# Patient Record
Sex: Male | Born: 1955 | Race: Black or African American | Hispanic: No | Marital: Married | Smoking: Former smoker
Health system: Southern US, Community
[De-identification: ages and names within clinical notes are randomized; demographics above are authoritative.]

## PROBLEM LIST (undated history)

## (undated) DIAGNOSIS — I779 Disorder of arteries and arterioles, unspecified: Secondary | ICD-10-CM

## (undated) DIAGNOSIS — I480 Paroxysmal atrial fibrillation: Secondary | ICD-10-CM

## (undated) DIAGNOSIS — M79671 Pain in right foot: Secondary | ICD-10-CM

## (undated) DIAGNOSIS — Z720 Tobacco use: Secondary | ICD-10-CM

## (undated) DIAGNOSIS — E119 Type 2 diabetes mellitus without complications: Secondary | ICD-10-CM

## (undated) DIAGNOSIS — I639 Cerebral infarction, unspecified: Secondary | ICD-10-CM

## (undated) DIAGNOSIS — I10 Essential (primary) hypertension: Secondary | ICD-10-CM

## (undated) DIAGNOSIS — G8194 Hemiplegia, unspecified affecting left nondominant side: Secondary | ICD-10-CM

## (undated) DIAGNOSIS — E7800 Pure hypercholesterolemia, unspecified: Secondary | ICD-10-CM

## (undated) DIAGNOSIS — R079 Chest pain, unspecified: Secondary | ICD-10-CM

## (undated) DIAGNOSIS — I5189 Other ill-defined heart diseases: Secondary | ICD-10-CM

## (undated) DIAGNOSIS — M79672 Pain in left foot: Secondary | ICD-10-CM

## (undated) DIAGNOSIS — E1165 Type 2 diabetes mellitus with hyperglycemia: Secondary | ICD-10-CM

## (undated) DIAGNOSIS — E139 Other specified diabetes mellitus without complications: Secondary | ICD-10-CM

## (undated) HISTORY — DX: Essential (primary) hypertension: I10

## (undated) HISTORY — PX: NO PAST SURGERIES: SHX2092

## (undated) HISTORY — DX: Cerebral infarction, unspecified: I63.9

## (undated) HISTORY — DX: Paroxysmal atrial fibrillation: I48.0

## (undated) HISTORY — DX: Chest pain, unspecified: R07.9

## (undated) HISTORY — DX: Pain in right foot: M79.672

## (undated) HISTORY — DX: Other specified diabetes mellitus without complications: E13.9

## (undated) HISTORY — DX: Pain in right foot: M79.671

## (undated) HISTORY — DX: Pure hypercholesterolemia, unspecified: E78.00

## (undated) NOTE — *Deleted (*Deleted)
Progress note, DGI and FOTO goals need to be updated  Vitals Prior:              BP: 138/75 mm Hg             HR: 77 BPM             SPO2: 100%   Neuro Re-Ed:              Goal Reassessment today for deciding future POC for future insurance Auth.                         TUG: 30.3 sec with quad cane and SBA+1                         5xSTS 11.03 sec with RUE support                         10 m gait speed with quad cane: 0.39 sec with quad cane and SBA+1                         Berg Balance Scale: 45/56  There.ex:              Nu-Step L4 for 6 min. LE's only. Not billed.

---

## 2009-05-04 ENCOUNTER — Emergency Department (HOSPITAL_COMMUNITY): Admission: EM | Admit: 2009-05-04 | Discharge: 2009-05-04 | Payer: Self-pay | Admitting: Emergency Medicine

## 2011-08-30 ENCOUNTER — Emergency Department: Payer: Self-pay | Admitting: *Deleted

## 2011-08-30 LAB — COMPREHENSIVE METABOLIC PANEL
Albumin: 3.6 g/dL (ref 3.4–5.0)
Alkaline Phosphatase: 141 U/L — ABNORMAL HIGH (ref 50–136)
Anion Gap: 13 (ref 7–16)
BUN: 11 mg/dL (ref 7–18)
Bilirubin,Total: 0.2 mg/dL (ref 0.2–1.0)
Calcium, Total: 9.3 mg/dL (ref 8.5–10.1)
Chloride: 101 mmol/L (ref 98–107)
Co2: 25 mmol/L (ref 21–32)
Creatinine: 1.04 mg/dL (ref 0.60–1.30)
EGFR (African American): >60
EGFR (Non-African Amer.): >60
Glucose: 443 mg/dL — ABNORMAL HIGH (ref 65–99)
Osmolality: 296 (ref 275–301)
Potassium: 3.9 mmol/L (ref 3.5–5.1)
SGOT(AST): 32 U/L (ref 15–37)
SGPT (ALT): 32 U/L
Sodium: 139 mmol/L (ref 136–145)
Total Protein: 7.3 g/dL (ref 6.4–8.2)

## 2011-08-30 LAB — CBC
HCT: 43.7 % (ref 40.0–52.0)
HGB: 14.9 g/dL (ref 13.0–18.0)
MCH: 30.6 pg (ref 26.0–34.0)
MCHC: 34 g/dL (ref 32.0–36.0)
MCV: 90 fL (ref 80–100)
Platelet: 201 10*3/uL (ref 150–440)
RBC: 4.85 10*6/uL (ref 4.40–5.90)
RDW: 13.8 % (ref 11.5–14.5)
WBC: 6.2 10*3/uL (ref 3.8–10.6)

## 2013-01-01 ENCOUNTER — Emergency Department: Payer: Self-pay | Admitting: Emergency Medicine

## 2013-02-02 ENCOUNTER — Emergency Department: Payer: Self-pay | Admitting: Emergency Medicine

## 2013-03-15 ENCOUNTER — Ambulatory Visit: Payer: Self-pay | Admitting: Family Medicine

## 2013-03-15 LAB — CBC WITH DIFFERENTIAL/PLATELET
Basophil #: 0.1 10*3/uL (ref 0.0–0.1)
Basophil %: 0.9 %
Eosinophil #: 0.1 10*3/uL (ref 0.0–0.7)
Eosinophil %: 1.6 %
HCT: 45.7 % (ref 40.0–52.0)
HGB: 15.6 g/dL (ref 13.0–18.0)
Lymphocyte #: 2.7 10*3/uL (ref 1.0–3.6)
Lymphocyte %: 44.9 %
MCH: 29.9 pg (ref 26.0–34.0)
MCHC: 34.1 g/dL (ref 32.0–36.0)
MCV: 88 fL (ref 80–100)
Monocyte #: 0.5 x10 3/mm (ref 0.2–1.0)
Monocyte %: 8.1 %
Neutrophil #: 2.7 10*3/uL (ref 1.4–6.5)
Neutrophil %: 44.5 %
Platelet: 188 10*3/uL (ref 150–440)
RBC: 5.22 10*6/uL (ref 4.40–5.90)
RDW: 14.5 % (ref 11.5–14.5)
WBC: 6 10*3/uL (ref 3.8–10.6)

## 2013-03-15 LAB — COMPREHENSIVE METABOLIC PANEL
Albumin: 4 g/dL (ref 3.4–5.0)
Alkaline Phosphatase: 131 U/L (ref 50–136)
Anion Gap: 8 (ref 7–16)
BUN: 21 mg/dL — ABNORMAL HIGH (ref 7–18)
Bilirubin,Total: 0.2 mg/dL (ref 0.2–1.0)
Calcium, Total: 9.8 mg/dL (ref 8.5–10.1)
Chloride: 97 mmol/L — ABNORMAL LOW (ref 98–107)
Co2: 30 mmol/L (ref 21–32)
Creatinine: 1.3 mg/dL (ref 0.60–1.30)
EGFR (African American): >60
EGFR (Non-African Amer.): >60
Glucose: 372 mg/dL — ABNORMAL HIGH (ref 65–99)
Osmolality: 288 (ref 275–301)
Potassium: 4.3 mmol/L (ref 3.5–5.1)
SGOT(AST): 11 U/L — ABNORMAL LOW (ref 15–37)
SGPT (ALT): 31 U/L (ref 12–78)
Sodium: 135 mmol/L — ABNORMAL LOW (ref 136–145)
Total Protein: 7.9 g/dL (ref 6.4–8.2)

## 2013-03-15 LAB — OCCULT BLOOD X 1 CARD TO LAB, STOOL: Occult Blood, Feces: POSITIVE

## 2013-05-15 ENCOUNTER — Ambulatory Visit: Payer: Self-pay | Admitting: Gastroenterology

## 2013-08-28 DIAGNOSIS — G629 Polyneuropathy, unspecified: Secondary | ICD-10-CM | POA: Insufficient documentation

## 2013-08-28 DIAGNOSIS — Z720 Tobacco use: Secondary | ICD-10-CM | POA: Insufficient documentation

## 2014-01-19 ENCOUNTER — Ambulatory Visit: Payer: Self-pay

## 2015-08-05 ENCOUNTER — Emergency Department
Admission: EM | Admit: 2015-08-05 | Discharge: 2015-08-05 | Disposition: A | Discharge status: Home / Self Care | Payer: Self-pay | Attending: Emergency Medicine | Admitting: Emergency Medicine

## 2015-08-05 ENCOUNTER — Encounter: Payer: Self-pay | Admitting: Emergency Medicine

## 2015-08-05 DIAGNOSIS — M25561 Pain in right knee: Secondary | ICD-10-CM | POA: Insufficient documentation

## 2015-08-05 DIAGNOSIS — E119 Type 2 diabetes mellitus without complications: Secondary | ICD-10-CM | POA: Insufficient documentation

## 2015-08-05 DIAGNOSIS — F17200 Nicotine dependence, unspecified, uncomplicated: Secondary | ICD-10-CM | POA: Insufficient documentation

## 2015-08-05 HISTORY — DX: Type 2 diabetes mellitus without complications: E11.9

## 2015-08-05 MED ORDER — MELOXICAM 15 MG PO TABS: 15.0000 mg | ORAL_TABLET | Freq: Every day | ORAL | Status: DC

## 2015-08-05 NOTE — Discharge Instructions (Signed)

## 2015-08-05 NOTE — ED Notes (Signed)
Pt presents to the Ed with right knee and right leg pain that started 3-4 days ago. Pt describes pain as aching " like there is something in it", that usually occurs while he is lying down.

## 2015-08-05 NOTE — ED Provider Notes (Signed)
[facility] Emergency Department Provider Note  ____________________________________________  Time seen: Approximately 1:04 PM  I have reviewed the triage vital signs and the nursing notes.   HISTORY  Chief Complaint Knee Pain    HPI Evan Townsend is a 59 y.o. male who presents emergency department complaining of right knee pain. He states that the pain initially began a month ago. He says it comes and goes. He denies any injury precipitating this pain. He states that it is an aching sensation located on the medial aspect of his knee. He says occasionally he will have a "twinge-like sensation" that warrants up and down his right leg. Symptoms wax and wane. He denies any "catching sensation." He has not tried any medication prior to arrival. He denies any numbness or tingling in his extremity. He denies any chest pain or shortness of breath.   Past Medical History  Diagnosis Date  . Diabetes mellitus without complication (HCC)     There are no active problems to display for this patient.   History reviewed. No pertinent past surgical history.  No current outpatient prescriptions on file.  Allergies Review of patient's allergies indicates no known allergies.  History reviewed. No pertinent family history.  Social History Social History  Substance Use Topics  . Smoking status: Current Every Day Smoker  . Smokeless tobacco: None  . Alcohol Use: Yes    Review of Systems Constitutional: No fever/chills Eyes: No visual changes. ENT: No sore throat. Cardiovascular: Denies chest pain. Respiratory: Denies shortness of breath. Gastrointestinal: No abdominal pain.  No nausea, no vomiting.  No diarrhea.  No constipation. Genitourinary: Negative for dysuria. Musculoskeletal: Negative for back pain. Endorses right knee pain. Skin: Negative for rash. Neurological: Negative for headaches, focal weakness or numbness.  10-point ROS otherwise  negative.  ____________________________________________   PHYSICAL EXAM:  VITAL SIGNS: ED Triage Vitals  Enc Vitals Group     BP 08/05/15 1254 206/83 mmHg     Pulse Rate 08/05/15 1254 50     Resp --      Temp 08/05/15 1254 98.6 F (37 C)     Temp Source 08/05/15 1254 Oral     SpO2 08/05/15 1254 98 %     Weight 08/05/15 1254 145 lb (65.772 kg)     Height 08/05/15 1254  (1.676 m)     Head Cir --      Peak Flow --      Pain Score 08/05/15 1255 9     Pain Loc --      Pain Edu? --      Excl. in GC? --     Constitutional: Alert and oriented. Well appearing and in no acute distress. Eyes: Conjunctivae are normal. PERRL. EOMI. Head: Atraumatic. Nose: No congestion/rhinnorhea. Mouth/Throat: Mucous membranes are moist.  Oropharynx non-erythematous. Neck: No stridor.   Cardiovascular: Normal rate, regular rhythm. Grossly normal heart sounds.  Good peripheral circulation. Respiratory: Normal respiratory effort.  No retractions. Lungs CTAB. Gastrointestinal: Soft and nontender. No distention. No abdominal bruits. No CVA tenderness. Musculoskeletal: No visible deformity to right knee when compared with left. Full range of motion to knee. Area is not warm to palpation. No edema. Patient has tenderness to palpation along the medial joint line. No palpable abnormality. Varus, valgus, Lachman's, McMurray's is negative. Pulses and sensation are intact distally.  No joint effusions. Neurologic:  Normal speech and language. No gross focal neurologic deficits are appreciated. No gait instability. Skin:  Skin is warm, dry  and intact. No rash noted. Psychiatric: Mood and affect are normal. Speech and behavior are normal.  ____________________________________________   LABS (all labs ordered are listed, but only abnormal results are displayed)  Labs Reviewed - No data to  display ____________________________________________  EKG   ____________________________________________  RADIOLOGY   ____________________________________________   PROCEDURES  Procedure(s) performed: None  Critical Care performed: No  ____________________________________________   INITIAL IMPRESSION / ASSESSMENT AND PLAN / ED COURSE  Pertinent labs & imaging results that were available during my care of the patient were reviewed by me and considered in my medical decision making (see chart for details).  Patient's diagnosis is consistent stent with right knee pain. Patient is having tenderness to palpation over the medial joint line but McMurray's test is negative. I will place the patient on anti-inflammatories for symptom control. Give strict ED precautions for the patient to return for any increase in symptoms. Patient is to follow-up with orthopedics in 2-3 weeks if symptoms persist. Patient verbalizes understanding of diagnosis and treatment plan and verbalizes compliance with same. ____________________________________________   FINAL CLINICAL IMPRESSION(S) / ED DIAGNOSES  Final diagnoses:  Right knee pain      Racheal PatchesJonathan D Cuthriell, PA-C 08/05/15 1308  Myrna Blazeravid Matthew Schaevitz, MD 08/05/15 [id number]

## 2016-03-08 ENCOUNTER — Emergency Department
Admission: EM | Admit: 2016-03-08 | Discharge: 2016-03-08 | Disposition: A | Discharge status: Home / Self Care | Payer: Self-pay | Attending: Emergency Medicine | Admitting: Emergency Medicine

## 2016-03-08 ENCOUNTER — Encounter: Payer: Self-pay | Admitting: Emergency Medicine

## 2016-03-08 DIAGNOSIS — E1165 Type 2 diabetes mellitus with hyperglycemia: Secondary | ICD-10-CM | POA: Insufficient documentation

## 2016-03-08 DIAGNOSIS — R739 Hyperglycemia, unspecified: Secondary | ICD-10-CM

## 2016-03-08 DIAGNOSIS — F17200 Nicotine dependence, unspecified, uncomplicated: Secondary | ICD-10-CM | POA: Insufficient documentation

## 2016-03-08 LAB — CBC
HCT: 43.6 % (ref 40.0–52.0)
Hemoglobin: 14.8 g/dL (ref 13.0–18.0)
MCH: 30.8 pg (ref 26.0–34.0)
MCHC: 34 g/dL (ref 32.0–36.0)
MCV: 90.4 fL (ref 80.0–100.0)
Platelets: 192 10*3/uL (ref 150–440)
RBC: 4.82 MIL/uL (ref 4.40–5.90)
RDW: 14.5 % (ref 11.5–14.5)
WBC: 5.4 10*3/uL (ref 3.8–10.6)

## 2016-03-08 LAB — URINALYSIS COMPLETE WITH MICROSCOPIC (ARMC ONLY)
Bacteria, UA: NONE SEEN
Bilirubin Urine: NEGATIVE
Glucose, UA: >500 mg/dL — AB
Ketones, ur: NEGATIVE mg/dL
Leukocytes, UA: NEGATIVE
Nitrite: NEGATIVE
Protein, ur: NEGATIVE mg/dL
Specific Gravity, Urine: 1.023 (ref 1.005–1.030)
Squamous Epithelial / LPF: NONE SEEN
pH: 5 (ref 5.0–8.0)

## 2016-03-08 LAB — BASIC METABOLIC PANEL
Anion gap: 8 (ref 5–15)
BUN: 13 mg/dL (ref 6–20)
CO2: 25 mmol/L (ref 22–32)
Calcium: 9.4 mg/dL (ref 8.9–10.3)
Chloride: 103 mmol/L (ref 101–111)
Creatinine, Ser: 0.96 mg/dL (ref 0.61–1.24)
GFR calc Af Amer: >60 mL/min (ref >60)
GFR calc non Af Amer: >60 mL/min (ref >60)
Glucose, Bld: 319 mg/dL — ABNORMAL HIGH (ref 65–99)
Potassium: 3.7 mmol/L (ref 3.5–5.1)
Sodium: 136 mmol/L (ref 135–145)

## 2016-03-08 LAB — GLUCOSE, CAPILLARY
Glucose-Capillary: 303 mg/dL — ABNORMAL HIGH (ref 65–99)
Glucose-Capillary: 397 mg/dL — ABNORMAL HIGH (ref 65–99)

## 2016-03-08 MED ORDER — GABAPENTIN 300 MG PO CAPS: 300.0000 mg | ORAL_CAPSULE | Freq: Three times a day (TID) | ORAL | 0 refills | Status: DC

## 2016-03-08 MED ORDER — INSULIN NPH ISOPHANE & REGULAR (70-30) 100 UNIT/ML ~~LOC~~ SUSP: SUBCUTANEOUS | 0 refills | Status: DC

## 2016-03-08 MED ORDER — FREESTYLE LANCETS MISC: 1 refills | Status: DC

## 2016-03-08 MED ORDER — LISINOPRIL 5 MG PO TABS: 5.0000 mg | ORAL_TABLET | Freq: Every day | ORAL | 0 refills | Status: DC

## 2016-03-08 MED ORDER — SODIUM CHLORIDE 0.9 % IV BOLUS (SEPSIS)
1000.0000 mL | Freq: Once | INTRAVENOUS | Status: AC
  Administered 2016-03-08: 1000 mL via INTRAVENOUS

## 2016-03-08 MED ORDER — GLUCOSE BLOOD VI STRP: ORAL_STRIP | 1 refills | Status: DC

## 2016-03-08 MED ORDER — SAXAGLIPTIN HCL 5 MG PO TABS: 5.0000 mg | ORAL_TABLET | Freq: Every day | ORAL | 0 refills | Status: DC

## 2016-03-08 MED ORDER — ATORVASTATIN CALCIUM 40 MG PO TABS: 40.0000 mg | ORAL_TABLET | Freq: Every day | ORAL | 0 refills | Status: DC

## 2016-03-08 NOTE — ED Notes (Signed)
Patient has arrived to room 6 at this time. Ambulates without difficulty.

## 2016-03-08 NOTE — ED Triage Notes (Signed)
Pt presents with high blood sugar. Pt has been off meds for six mths d/t not having a pcp.  Pt c/o abd, light headed.

## 2016-03-08 NOTE — ED Provider Notes (Addendum)
[facility] Emergency Department Provider Note   ____________________________________________  Time seen: Approximately 315 PM  I have reviewed the triage vital signs and the nursing notes.   HISTORY  Chief Complaint Hyperglycemia   HPI TIAN MCMURTREY is a 60 y.o. male with a history of diabetes as well as peripheral neuropathy was presenting to the emergency department today complaining of being out of his medications for several months. He says that the primary reason he came to the emergency department today was that he had a relative was also being evaluated in a figure that he should be checked. He formerly was a patient at the [facility] family practice clinic. However, he has since moved [place]. He was complaining in triage of epigastric abdominal pain over the past several days as well as some episodes of lightheadedness but he says that they have all resolved at this time and he has no complaints. He denies any vomiting or nausea. Says that he smokes but does not drink or use any illicit drugs. Says that he had been on metformin in the past but does not use this medication anymore due to diarrhea.   Past Medical History:  Diagnosis Date  . Diabetes mellitus without complication (HCC)     There are no active problems to display for this patient.   History reviewed. No pertinent surgical history.  Current Outpatient Rx  . Order #: [id number] Class: Print    Allergies Review of patient's allergies indicates no known allergies.  No family history on file.  Social History Social History  Substance Use Topics  . Smoking status: Current Every Day Smoker  . Smokeless tobacco: Never Used  . Alcohol use Yes    Review of Systems Constitutional: No fever/chills Eyes: No visual changes. ENT: No sore throat. Cardiovascular: Denies chest pain. Respiratory: Denies shortness of breath. Gastrointestinal:  No nausea, no vomiting.  No diarrhea.  No  constipation. Genitourinary: Negative for dysuria. Musculoskeletal: Negative for back pain. Skin: Negative for rash. Neurological: Negative for headaches, focal weakness or numbness.  10-point ROS otherwise negative.  ____________________________________________   PHYSICAL EXAM:  VITAL SIGNS: ED Triage Vitals  Enc Vitals Group     BP 03/08/16 1057 (!) 161/75     Pulse Rate 03/08/16 1057 (!) 54     Resp 03/08/16 1057 20     Temp 03/08/16 1057 97.8 F (36.6 C)     Temp Source 03/08/16 1057 Oral     SpO2 03/08/16 1057 97 %     Weight --      Height --      Head Circumference --      Peak Flow --      Pain Score 03/08/16 1059 9     Pain Loc --      Pain Edu? --      Excl. in GC? --     Constitutional: Alert and oriented. Well appearing and in no acute distress. Eyes: Conjunctivae are normal. PERRL. EOMI. Head: Atraumatic. Nose: No congestion/rhinnorhea. Mouth/Throat: Mucous membranes are moist.   Neck: No stridor.   Cardiovascular: Normal rate, regular rhythm. Grossly normal heart sounds.   Respiratory: Normal respiratory effort.  No retractions. Lungs CTAB. Gastrointestinal: Soft and nontender. No distention. No abdominal bruits. No CVA tenderness. Musculoskeletal: No lower extremity tenderness nor edema.  No joint effusions. Neurologic:  Normal speech and language. No gross focal neurologic deficits are appreciated.  Skin:  Skin is warm, dry and intact. No rash noted. Psychiatric: Mood  and affect are normal. Speech and behavior are normal.  ____________________________________________   LABS (all labs ordered are listed, but only abnormal results are displayed)  Labs Reviewed  BASIC METABOLIC PANEL - Abnormal; Notable for the following:       Result Value   Glucose, Bld 319 (*)    All other components within normal limits  URINALYSIS COMPLETEWITH MICROSCOPIC ([facility] ONLY) - Abnormal; Notable for the following:    Color, Urine YELLOW (*)    APPearance CLEAR (*)     Glucose, UA >500 (*)    Hgb urine dipstick 1+ (*)    All other components within normal limits  GLUCOSE, CAPILLARY - Abnormal; Notable for the following:    Glucose-Capillary 303 (*)    All other components within normal limits  GLUCOSE, CAPILLARY - Abnormal; Notable for the following:    Glucose-Capillary 397 (*)    All other components within normal limits  CBC  CBG MONITORING, ED   ____________________________________________  EKG   ____________________________________________  RADIOLOGY   ____________________________________________   PROCEDURES   Procedures   ____________________________________________   INITIAL IMPRESSION / ASSESSMENT AND PLAN / ED COURSE  Pertinent labs & imaging results that were available during my care of the patient were reviewed by me and considered in my medical decision making (see chart for details).  We will refill medications the patient was previously on. Patient says that he will not be able to go down to [facility] for further care [facility] family practice because of lack of transportation. The family is requesting contact information for the [facility]. They said that they're able to get the prescriptions from a pharmacy that this is connected through Medicaid locally. Patient with elevated blood sugar but no signs of DKA. ____________________________________________   FINAL CLINICAL IMPRESSION(S) / ED DIAGNOSES  Hyperglycemia.    NEW MEDICATIONS STARTED DURING THIS VISIT:  New Prescriptions   No medications on file     Note:  This document was prepared using Dragon voice recognition software and may include unintentional dictation errors.    Myrna Blazer, MD 03/08/16 1527  Started the gabapentin as well as the lisinopril back in lower doses. Lisinopril was started a lower dose because the patient has a very good blood pressure today and I do not want to make him hypotensive. For similar reason I do  not want to overload him with gabapentin since he has been off for many months. Patient understands plan and willing to comply.    Myrna Blazer, MD 03/08/16 [phone]

## 2016-03-08 NOTE — ED Notes (Signed)
Patient states he has not had any insulin in 6 months because he cannot afford it. Patient states he also has no way of checking his blood sugar at home.

## 2016-03-08 NOTE — ED Notes (Signed)
Through diabetic education given to patient. Symptoms of hypo and hyer glyemia reviewed. Patient verbalizes understanding.

## 2016-03-08 NOTE — ED Notes (Signed)
Patient not in room at this time. Awaiting for bed to return to room then will transfer patient.

## 2016-03-18 ENCOUNTER — Ambulatory Visit: Payer: Self-pay

## 2016-11-10 ENCOUNTER — Inpatient Hospital Stay
Admission: EM | Admit: 2016-11-10 | Discharge: 2016-11-11 | DRG: 066 | Disposition: A | Discharge status: Home / Self Care | Payer: Self-pay | Attending: Internal Medicine | Admitting: Internal Medicine

## 2016-11-10 ENCOUNTER — Emergency Department: Payer: Self-pay

## 2016-11-10 ENCOUNTER — Encounter: Payer: Self-pay | Admitting: *Deleted

## 2016-11-10 ENCOUNTER — Inpatient Hospital Stay: Payer: Self-pay

## 2016-11-10 ENCOUNTER — Inpatient Hospital Stay
Admit: 2016-11-10 | Discharge: 2016-11-10 | Disposition: A | Discharge status: Home / Self Care | Payer: Self-pay | Attending: Internal Medicine | Admitting: Internal Medicine

## 2016-11-10 DIAGNOSIS — E1142 Type 2 diabetes mellitus with diabetic polyneuropathy: Secondary | ICD-10-CM | POA: Diagnosis present

## 2016-11-10 DIAGNOSIS — I639 Cerebral infarction, unspecified: Secondary | ICD-10-CM

## 2016-11-10 DIAGNOSIS — R202 Paresthesia of skin: Secondary | ICD-10-CM

## 2016-11-10 DIAGNOSIS — E785 Hyperlipidemia, unspecified: Secondary | ICD-10-CM | POA: Diagnosis present

## 2016-11-10 DIAGNOSIS — R739 Hyperglycemia, unspecified: Secondary | ICD-10-CM

## 2016-11-10 DIAGNOSIS — E119 Type 2 diabetes mellitus without complications: Secondary | ICD-10-CM

## 2016-11-10 DIAGNOSIS — H538 Other visual disturbances: Secondary | ICD-10-CM | POA: Diagnosis present

## 2016-11-10 DIAGNOSIS — I10 Essential (primary) hypertension: Secondary | ICD-10-CM | POA: Diagnosis present

## 2016-11-10 DIAGNOSIS — R29703 NIHSS score 3: Secondary | ICD-10-CM | POA: Diagnosis present

## 2016-11-10 DIAGNOSIS — F17210 Nicotine dependence, cigarettes, uncomplicated: Secondary | ICD-10-CM | POA: Diagnosis present

## 2016-11-10 DIAGNOSIS — R200 Anesthesia of skin: Secondary | ICD-10-CM | POA: Diagnosis present

## 2016-11-10 DIAGNOSIS — E1169 Type 2 diabetes mellitus with other specified complication: Secondary | ICD-10-CM

## 2016-11-10 DIAGNOSIS — Z79899 Other long term (current) drug therapy: Secondary | ICD-10-CM

## 2016-11-10 DIAGNOSIS — Z91120 Patient's intentional underdosing of medication regimen due to financial hardship: Secondary | ICD-10-CM

## 2016-11-10 DIAGNOSIS — E1165 Type 2 diabetes mellitus with hyperglycemia: Secondary | ICD-10-CM | POA: Diagnosis present

## 2016-11-10 DIAGNOSIS — Z794 Long term (current) use of insulin: Secondary | ICD-10-CM

## 2016-11-10 DIAGNOSIS — E11649 Type 2 diabetes mellitus with hypoglycemia without coma: Secondary | ICD-10-CM

## 2016-11-10 DIAGNOSIS — R4781 Slurred speech: Secondary | ICD-10-CM | POA: Diagnosis present

## 2016-11-10 DIAGNOSIS — Z791 Long term (current) use of non-steroidal anti-inflammatories (NSAID): Secondary | ICD-10-CM

## 2016-11-10 HISTORY — DX: Cerebral infarction, unspecified: I63.9

## 2016-11-10 LAB — DIFFERENTIAL
Basophils Absolute: 0 10*3/uL (ref 0–0.1)
Basophils Relative: 1 %
Eosinophils Absolute: 0.1 10*3/uL (ref 0–0.7)
Eosinophils Relative: 2 %
Lymphocytes Relative: 35 %
Lymphs Abs: 1.9 10*3/uL (ref 1.0–3.6)
Monocytes Absolute: 0.5 10*3/uL (ref 0.2–1.0)
Monocytes Relative: 9 %
Neutro Abs: 2.9 10*3/uL (ref 1.4–6.5)
Neutrophils Relative %: 53 %

## 2016-11-10 LAB — CBC
HCT: 41.9 % (ref 40.0–52.0)
Hemoglobin: 14.3 g/dL (ref 13.0–18.0)
MCH: 30.4 pg (ref 26.0–34.0)
MCHC: 34 g/dL (ref 32.0–36.0)
MCV: 89.2 fL (ref 80.0–100.0)
Platelets: 232 10*3/uL (ref 150–440)
RBC: 4.7 MIL/uL (ref 4.40–5.90)
RDW: 13.9 % (ref 11.5–14.5)
WBC: 5.3 10*3/uL (ref 3.8–10.6)

## 2016-11-10 LAB — COMPREHENSIVE METABOLIC PANEL
ALT: 27 U/L (ref 17–63)
AST: 29 U/L (ref 15–41)
Albumin: 4 g/dL (ref 3.5–5.0)
Alkaline Phosphatase: 137 U/L — ABNORMAL HIGH (ref 38–126)
Anion gap: 8 (ref 5–15)
BUN: 20 mg/dL (ref 6–20)
CO2: 25 mmol/L (ref 22–32)
Calcium: 9.4 mg/dL (ref 8.9–10.3)
Chloride: 102 mmol/L (ref 101–111)
Creatinine, Ser: 1.18 mg/dL (ref 0.61–1.24)
GFR calc Af Amer: >60 mL/min (ref >60)
GFR calc non Af Amer: >60 mL/min (ref >60)
Glucose, Bld: 402 mg/dL — ABNORMAL HIGH (ref 65–99)
Potassium: 4 mmol/L (ref 3.5–5.1)
Sodium: 135 mmol/L (ref 135–145)
Total Bilirubin: 0.4 mg/dL (ref 0.3–1.2)
Total Protein: 7.3 g/dL (ref 6.5–8.1)

## 2016-11-10 LAB — URINE DRUG SCREEN, QUALITATIVE (ARMC ONLY)
Amphetamines, Ur Screen: NOT DETECTED
Barbiturates, Ur Screen: NOT DETECTED
Benzodiazepine, Ur Scrn: NOT DETECTED
Cannabinoid 50 Ng, Ur ~~LOC~~: NOT DETECTED
Cocaine Metabolite,Ur ~~LOC~~: POSITIVE — AB
MDMA (Ecstasy)Ur Screen: NOT DETECTED
Methadone Scn, Ur: NOT DETECTED
Opiate, Ur Screen: NOT DETECTED
Phencyclidine (PCP) Ur S: NOT DETECTED
Tricyclic, Ur Screen: NOT DETECTED

## 2016-11-10 LAB — TSH: TSH: 0.366 u[IU]/mL (ref 0.350–4.500)

## 2016-11-10 LAB — URINALYSIS, COMPLETE (UACMP) WITH MICROSCOPIC
Bacteria, UA: NONE SEEN
Bilirubin Urine: NEGATIVE
Glucose, UA: >=500 mg/dL — AB
Hgb urine dipstick: NEGATIVE
Ketones, ur: NEGATIVE mg/dL
Leukocytes, UA: NEGATIVE
Nitrite: NEGATIVE
Protein, ur: NEGATIVE mg/dL
RBC / HPF: NONE SEEN RBC/hpf (ref 0–5)
Specific Gravity, Urine: 1.025 (ref 1.005–1.030)
Squamous Epithelial / LPF: NONE SEEN
pH: 6 (ref 5.0–8.0)

## 2016-11-10 LAB — TROPONIN I
Troponin I: <0.03 ng/mL (ref <0.03)
Troponin I: <0.03 ng/mL (ref <0.03)
Troponin I: <0.03 ng/mL (ref <0.03)

## 2016-11-10 LAB — GLUCOSE, CAPILLARY
Glucose-Capillary: 409 mg/dL — ABNORMAL HIGH (ref 65–99)
Glucose-Capillary: 307 mg/dL — ABNORMAL HIGH (ref 65–99)
Glucose-Capillary: 392 mg/dL — ABNORMAL HIGH (ref 65–99)
Glucose-Capillary: 416 mg/dL — ABNORMAL HIGH (ref 65–99)
Glucose-Capillary: 66 mg/dL (ref 65–99)
Glucose-Capillary: 245 mg/dL — ABNORMAL HIGH (ref 65–99)

## 2016-11-10 LAB — MAGNESIUM: Magnesium: 1.8 mg/dL (ref 1.7–2.4)

## 2016-11-10 LAB — ETHANOL: Alcohol, Ethyl (B): <5 mg/dL (ref <5)

## 2016-11-10 LAB — APTT: aPTT: 26 seconds (ref 24–36)

## 2016-11-10 LAB — PROTIME-INR
INR: 0.92
Prothrombin Time: 12.3 seconds (ref 11.4–15.2)

## 2016-11-10 MED ORDER — SODIUM CHLORIDE 0.9% FLUSH
3.0000 mL | Freq: Two times a day (BID) | INTRAVENOUS | Status: DC
  Administered 2016-11-10 – 2016-11-11 (×3): 3 mL via INTRAVENOUS

## 2016-11-10 MED ORDER — ATORVASTATIN CALCIUM 20 MG PO TABS
40.0000 mg | ORAL_TABLET | Freq: Every day | ORAL | Status: DC
  Administered 2016-11-10 – 2016-11-11 (×2): 40 mg via ORAL
  Filled 2016-11-10 (×2): qty 2

## 2016-11-10 MED ORDER — ASPIRIN EC 81 MG PO TBEC
81.0000 mg | DELAYED_RELEASE_TABLET | Freq: Every day | ORAL | Status: DC
  Administered 2016-11-11: 81 mg via ORAL
  Filled 2016-11-10: qty 1

## 2016-11-10 MED ORDER — ENOXAPARIN SODIUM 40 MG/0.4ML ~~LOC~~ SOLN
40.0000 mg | SUBCUTANEOUS | Status: DC
  Administered 2016-11-10: 21:00:00 40 mg via SUBCUTANEOUS
  Filled 2016-11-10: qty 0.4

## 2016-11-10 MED ORDER — SODIUM CHLORIDE 0.9% FLUSH
3.0000 mL | Freq: Two times a day (BID) | INTRAVENOUS | Status: DC
  Administered 2016-11-10 – 2016-11-11 (×5): 3 mL via INTRAVENOUS

## 2016-11-10 MED ORDER — INSULIN ASPART 100 UNIT/ML ~~LOC~~ SOLN
0.0000 [IU] | Freq: Every day | SUBCUTANEOUS | Status: DC
  Administered 2016-11-10: 2 [IU] via SUBCUTANEOUS
  Filled 2016-11-10: qty 2

## 2016-11-10 MED ORDER — SODIUM CHLORIDE 0.9% FLUSH
3.0000 mL | INTRAVENOUS | Status: DC | PRN
Start: 2016-11-10 — End: 2016-11-11

## 2016-11-10 MED ORDER — INSULIN ASPART 100 UNIT/ML ~~LOC~~ SOLN
0.0000 [IU] | Freq: Three times a day (TID) | SUBCUTANEOUS | Status: DC
  Administered 2016-11-10: 17:00:00 15 [IU] via SUBCUTANEOUS
  Administered 2016-11-11: 5 [IU] via SUBCUTANEOUS
  Administered 2016-11-11: 2 [IU] via SUBCUTANEOUS
  Filled 2016-11-10: qty 2
  Filled 2016-11-10: qty 5
  Filled 2016-11-10: qty 15

## 2016-11-10 MED ORDER — ONDANSETRON HCL 4 MG/2ML IJ SOLN: 4.0000 mg | Freq: Four times a day (QID) | INTRAMUSCULAR | Status: DC | PRN

## 2016-11-10 MED ORDER — INSULIN ASPART 100 UNIT/ML ~~LOC~~ SOLN
4.0000 [IU] | Freq: Every day | SUBCUTANEOUS | Status: DC
  Administered 2016-11-11: 13:00:00 4 [IU] via SUBCUTANEOUS
  Filled 2016-11-10: qty 4

## 2016-11-10 MED ORDER — ASPIRIN 81 MG PO CHEW
324.0000 mg | CHEWABLE_TABLET | Freq: Once | ORAL | Status: AC
  Administered 2016-11-10: 324 mg via ORAL
  Filled 2016-11-10: qty 4

## 2016-11-10 MED ORDER — IOPAMIDOL (ISOVUE-370) INJECTION 76%
75.0000 mL | Freq: Once | INTRAVENOUS | Status: AC | PRN
  Administered 2016-11-10: 75 mL via INTRAVENOUS

## 2016-11-10 MED ORDER — ONDANSETRON HCL 4 MG PO TABS: 4.0000 mg | ORAL_TABLET | Freq: Four times a day (QID) | ORAL | Status: DC | PRN

## 2016-11-10 MED ORDER — NICOTINE 21 MG/24HR TD PT24
21.0000 mg | MEDICATED_PATCH | Freq: Every day | TRANSDERMAL | Status: DC
  Administered 2016-11-10 – 2016-11-11 (×2): 21 mg via TRANSDERMAL
  Filled 2016-11-10 (×2): qty 1

## 2016-11-10 MED ORDER — ACETAMINOPHEN 650 MG RE SUPP
650.0000 mg | Freq: Four times a day (QID) | RECTAL | Status: DC | PRN
Start: 2016-11-10 — End: 2016-11-11

## 2016-11-10 MED ORDER — GABAPENTIN 300 MG PO CAPS
300.0000 mg | ORAL_CAPSULE | Freq: Three times a day (TID) | ORAL | Status: DC
  Administered 2016-11-10 – 2016-11-11 (×3): 300 mg via ORAL
  Filled 2016-11-10 (×3): qty 1

## 2016-11-10 MED ORDER — LIVING WELL WITH DIABETES BOOK
Freq: Once | Status: AC
  Administered 2016-11-10: 11:00:00
  Filled 2016-11-10: qty 1

## 2016-11-10 MED ORDER — ACETAMINOPHEN 325 MG PO TABS
650.0000 mg | ORAL_TABLET | Freq: Four times a day (QID) | ORAL | Status: DC | PRN
  Administered 2016-11-10: 650 mg via ORAL
  Filled 2016-11-10: qty 2

## 2016-11-10 MED ORDER — INSULIN ASPART PROT & ASPART (70-30 MIX) 100 UNIT/ML ~~LOC~~ SUSP
16.0000 [IU] | Freq: Two times a day (BID) | SUBCUTANEOUS | Status: DC
  Administered 2016-11-10 – 2016-11-11 (×2): 16 [IU] via SUBCUTANEOUS
  Filled 2016-11-10 (×2): qty 16

## 2016-11-10 MED ORDER — SODIUM CHLORIDE 0.9 % IV SOLN: 250.0000 mL | INTRAVENOUS | Status: DC | PRN

## 2016-11-10 NOTE — Progress Notes (Signed)
Speech Therapy Note: received order, reviewed chart notes. NSG consulted and gave a positive report on pt's swallowing abilities - he is swallowing Whole pills and passed his swallowing screen adequately per her report. Pt is not c/o any oral weakness, only min "tingling" in cheek. Pt is requesting solid food for his meal tonight, not the dysphagia level 2 as ordered. Due to pt's positive presentation and NSG who will monitor pt w/ the dinner meal tonight, SLP upgraded the diet consistency to a Regular diet consistency. General aspiration precautions recommended to NSG who agreed to monitor pt as needed. ST services will f/u in morning for assessment as indicated. NSG agreed.    Jerilynn SomKatherine Hulen Mandler, MS, CCC-SLP

## 2016-11-10 NOTE — ED Notes (Signed)
Patient presents to the ED reporting slurred speech x 1 week, numbness and tingling to left side of face and left hand with some numbness to left hand since 10pm.  Patient reports that he is an insulin dependent diabetic who has been out of insulin x approx. 3 weeks due to lack of insurance and financial situation.  Patient reports that he checks his blood sugar every other day to preserve strips.

## 2016-11-10 NOTE — Progress Notes (Signed)
Chaplain provided education, an Advance Directives, and helped patient and his wife to complete it.

## 2016-11-10 NOTE — H&P (Addendum)
[facility].   [facility] [place] [facility]   PATIENT NAME: Evan Townsend    MR#:  [id number]  DATE OF BIRTH:  Sep 02, 1955  DATE OF ADMISSION:  11/10/2016  PRIMARY CARE PHYSICIAN: No PCP Per Patient   REQUESTING/REFERRING PHYSICIAN:   CHIEF COMPLAINT:   Chief Complaint  Patient presents with  . Numbness    HISTORY OF PRESENT ILLNESS: Evan ChaletDuncan Pilar  is a 61 y.o. male with a known history of Hypertension, peripheral neuropathy, peripheral neuropathy  diabetes mellitus, who is off his medications for about 1 month, due to inability to reach [facility], who presents to the hospital with complaints of left-sided paresthesias, slurred speech. A wound to the patient, he was doing well up until approximately 1 week ago, when he started noticing that his speech was slurry. Yesterday, however, around 10 PM, he started having left sided numbness, including his left hand, lower arm, lower face on the left side, left thigh. No weakness was noted, patient denies any new visual problems. He admits of some blurry vision for the past one months however. He also tells me that he's been losing weight, he lost approximately 75 pounds over the past one year, unintentionally. Hospitalist services were contacted for admission. The patient still smokes, one pack a day, for the past 20 years.      PAST MEDICAL HISTORY:   Past Medical History:  Diagnosis Date  . Diabetes mellitus without complication (HCC)     PAST SURGICAL HISTORY: History reviewed. No pertinent surgical history.  SOCIAL HISTORY:  Social History  Substance Use Topics  . Smoking status: Current Every Day Smoker  . Smokeless tobacco: Never Used  . Alcohol use Yes    FAMILY HISTORY: No early coronary artery disease DRUG ALLERGIES: No Known Allergies  Review of Systems  Constitutional: Positive for malaise/fatigue and weight loss. Negative for chills and fever.   HENT: Negative for congestion.   Eyes: Positive for blurred vision. Negative for double vision.  Respiratory: Negative for cough, sputum production, shortness of breath and wheezing.   Cardiovascular: Negative for chest pain, palpitations, orthopnea, leg swelling and PND.  Gastrointestinal: Negative for abdominal pain, blood in stool, constipation, diarrhea, nausea and vomiting.  Genitourinary: Positive for dysuria. Negative for frequency, hematuria and urgency.  Musculoskeletal: Negative for falls.  Neurological: Positive for sensory change and speech change. Negative for dizziness, tremors, focal weakness and headaches.  Endo/Heme/Allergies: Does not bruise/bleed easily.  Psychiatric/Behavioral: Negative for depression. The patient does not have insomnia.     MEDICATIONS AT HOME:  Prior to Admission medications   Medication Sig Start Date End Date Taking? Authorizing Provider  atorvastatin (LIPITOR) 40 MG tablet Take 1 tablet (40 mg total) by mouth daily. 03/08/16 03/08/17 Yes Myrna Blazeravid Matthew Schaevitz, MD  gabapentin (NEURONTIN) 300 MG capsule Take 1 capsule (300 mg total) by mouth 3 (three) times daily. 03/08/16 03/08/17 Yes Myrna Blazeravid Matthew Schaevitz, MD  glucose blood (FREESTYLE INSULINX TEST) test strip Use as instructed 03/08/16  Yes Myrna Blazeravid Matthew Schaevitz, MD  insulin NPH-regular Human (NOVOLIN 70/30) (70-30) 100 UNIT/ML injection Inject 0.6616ml (16 units total) under the skin 2 (two) times a day with meals 03/08/16  Yes Myrna Blazeravid Matthew Schaevitz, MD  lisinopril (PRINIVIL,ZESTRIL) 5 MG tablet Take 1 tablet (5 mg total) by mouth daily. 03/08/16 03/08/17 Yes Myrna Blazeravid Matthew Schaevitz, MD  meloxicam (MOBIC) 15 MG tablet Take 1 tablet (15 mg total) by mouth daily. 08/05/15  Yes  Delorise Royals Cuthriell, PA-C  saxagliptin HCl (ONGLYZA) 5 MG TABS tablet Take 1 tablet (5 mg total) by mouth daily. 03/08/16  Yes Myrna Blazer, MD  Lancets (FREESTYLE) lancets Use for glucose checks four times daily  03/08/16   Myrna Blazer, MD      PHYSICAL EXAMINATION:   VITAL SIGNS: Blood pressure (!) 176/84, pulse (!) 53, temperature 98.9 F (37.2 C), temperature source Oral, resp. rate 13, height 5\' 6"  (1.676 m), weight 68 kg (150 lb), SpO2 97 %.  GENERAL:  61 y.o.-year-old patient lying in the bed with no acute distress.  EYES: Pupils equal, round, reactive to light and accommodation. No scleral icterus. Extraocular muscles intact.  HEENT: Head atraumatic, normocephalic. Oropharynx and nasopharynx clear.  NECK:  Supple, no jugular venous distention. No thyroid enlargement, no tenderness.  LUNGS: Normal breath sounds bilaterally, no wheezing, rales,rhonchi or crepitation. No use of accessory muscles of respiration.  CARDIOVASCULAR: S1, S2 normal. No murmurs, rubs, or gallops.  ABDOMEN: Soft, nontender, nondistended. Bowel sounds present. No organomegaly or mass.  EXTREMITIES: No pedal edema, cyanosis, or clubbing.  NEUROLOGIC: Cranial nerves II through XII are Grossly intact, minimal tongue deviation to the left. . Muscle strength 5/5 in all extremities. Sensation no significant difference with the light touch . Gait not checked. Slurred speech PSYCHIATRIC: The patient is alert and oriented x 3.  SKIN: No obvious rash, lesion, or ulcer.   LABORATORY PANEL:   CBC  Recent Labs Lab 11/10/16 0908  WBC 5.3  HGB 14.3  HCT 41.9  PLT 232  MCV 89.2  MCH 30.4  MCHC 34.0  RDW 13.9  LYMPHSABS 1.9  MONOABS 0.5  EOSABS 0.1  BASOSABS 0.0   ------------------------------------------------------------------------------------------------------------------  Chemistries   Recent Labs Lab 11/10/16 0908  NA 135  K 4.0  CL 102  CO2 25  GLUCOSE 402*  BUN 20  CREATININE 1.18  CALCIUM 9.4  AST 29  ALT 27  ALKPHOS 137*  BILITOT 0.4   ------------------------------------------------------------------------------------------------------------------  Cardiac Enzymes  Recent  Labs Lab 11/10/16 0908  TROPONINI <0.03   ------------------------------------------------------------------------------------------------------------------  RADIOLOGY: Ct Angio Head W Or Wo Contrast  Result Date: 11/10/2016 CLINICAL DATA:  Left face and upper extremity paresthesias. Slurred speech. Symptoms since 10 p.m. yesterday. EXAM: CT ANGIOGRAPHY HEAD AND NECK TECHNIQUE: Multidetector CT imaging of the head and neck was performed using the standard protocol during bolus administration of intravenous contrast. Multiplanar CT image reconstructions and MIPs were obtained to evaluate the vascular anatomy. Carotid stenosis measurements (when applicable) are obtained utilizing NASCET criteria, using the distal internal carotid diameter as the denominator. CONTRAST:  75 cc Isovue 370 intravenous COMPARISON:  Head CT from earlier today FINDINGS: CTA NECK FINDINGS Aortic arch: Atheromatous wall thickening with noncalcified plaque best seen along the upper descending aorta. Two vessel branching pattern. Right carotid system: Noncalcified plaque at the carotid bulb with narrowing measuring 30%. Posterior outpouching at the proximal ICA is likely normal bulb that is made prominent by more inferior noncalcified plaque. No suspected ulceration. No beading or dissection. Left carotid system: Smooth and widely patent. No notable atheromatous changes. Vertebral arteries: Noncalcified plaque at the non dominant right vertebral artery origin with mild stenosis. Elsewhere the vertebral arteries are smooth and widely patent. No proximal subclavian or brachiocephalic stenosis. Skeleton: Poor dentition with multiple periapical erosions. Other neck: No acute finding. Chronic sinusitis greatest in the left maxillary antrum. Subcentimeter nodule in the right thyroid, usually considered incidental at this size. Upper chest: No acute  finding Review of the MIP images confirms the above findings CTA HEAD FINDINGS Anterior  circulation: Mild atheromatous narrowing of the bilateral cavernous ICA. No large vessel occlusion. Focal mild atheromatous type narrowing of the distal left M1 segment. Negative for aneurysm. Posterior circulation: Symmetric vertebral arteries. Large posterior communicating arteries. Mild atherosclerotic type irregularity of bilateral PCAs, with P2 narrowing greater on the right. Negative for aneurysm. Venous sinuses: Patent Anatomic variants: None significant Delayed phase: No abnormal intracranial enhancement. Review of the MIP images confirms the above findings IMPRESSION: 1. No emergent large vessel occlusion. 2. Noncalcified atherosclerosis in the neck with non-flow limiting narrowing of the proximal right ICA and right vertebral arteries. 3. Intracranial atherosclerosis with mild left M1 and bilateral PCA narrowings. Electronically Signed   By: Marnee Spring M.D.   On: 11/10/2016 10:52   Ct Head Wo Contrast  Result Date: 11/10/2016 CLINICAL DATA:  Left hand and facial numbness beginning at 10 p.m. last night. EXAM: CT HEAD WITHOUT CONTRAST TECHNIQUE: Contiguous axial images were obtained from the base of the skull through the vertex without intravenous contrast. COMPARISON:  Head CT scan 05/04/2009. FINDINGS: Brain: Appears normal without hemorrhage, infarct, mass lesion, mass effect, midline shift or abnormal extra-axial fluid collection. No hydrocephalus or pneumocephalus. Vascular: Negative. Skull: Intact. Sinuses/Orbits: Negative. Other: None. IMPRESSION: Negative head CT. Electronically Signed   By: Drusilla Kanner M.D.   On: 11/10/2016 09:36   Ct Angio Neck W And/or Wo Contrast  Result Date: 11/10/2016 CLINICAL DATA:  Left face and upper extremity paresthesias. Slurred speech. Symptoms since 10 p.m. yesterday. EXAM: CT ANGIOGRAPHY HEAD AND NECK TECHNIQUE: Multidetector CT imaging of the head and neck was performed using the standard protocol during bolus administration of intravenous  contrast. Multiplanar CT image reconstructions and MIPs were obtained to evaluate the vascular anatomy. Carotid stenosis measurements (when applicable) are obtained utilizing NASCET criteria, using the distal internal carotid diameter as the denominator. CONTRAST:  75 cc Isovue 370 intravenous COMPARISON:  Head CT from earlier today FINDINGS: CTA NECK FINDINGS Aortic arch: Atheromatous wall thickening with noncalcified plaque best seen along the upper descending aorta. Two vessel branching pattern. Right carotid system: Noncalcified plaque at the carotid bulb with narrowing measuring 30%. Posterior outpouching at the proximal ICA is likely normal bulb that is made prominent by more inferior noncalcified plaque. No suspected ulceration. No beading or dissection. Left carotid system: Smooth and widely patent. No notable atheromatous changes. Vertebral arteries: Noncalcified plaque at the non dominant right vertebral artery origin with mild stenosis. Elsewhere the vertebral arteries are smooth and widely patent. No proximal subclavian or brachiocephalic stenosis. Skeleton: Poor dentition with multiple periapical erosions. Other neck: No acute finding. Chronic sinusitis greatest in the left maxillary antrum. Subcentimeter nodule in the right thyroid, usually considered incidental at this size. Upper chest: No acute finding Review of the MIP images confirms the above findings CTA HEAD FINDINGS Anterior circulation: Mild atheromatous narrowing of the bilateral cavernous ICA. No large vessel occlusion. Focal mild atheromatous type narrowing of the distal left M1 segment. Negative for aneurysm. Posterior circulation: Symmetric vertebral arteries. Large posterior communicating arteries. Mild atherosclerotic type irregularity of bilateral PCAs, with P2 narrowing greater on the right. Negative for aneurysm. Venous sinuses: Patent Anatomic variants: None significant Delayed phase: No abnormal intracranial enhancement. Review of  the MIP images confirms the above findings IMPRESSION: 1. No emergent large vessel occlusion. 2. Noncalcified atherosclerosis in the neck with non-flow limiting narrowing of the proximal right ICA and right vertebral arteries.  3. Intracranial atherosclerosis with mild left M1 and bilateral PCA narrowings. Electronically Signed   By: Marnee Spring M.D.   On: 11/10/2016 10:52    EKG: Orders placed or performed during the hospital encounter of 11/10/16  . ED EKG  . ED EKG  . EKG 12-Lead  . EKG 12-Lead   EKG in the emergency room reveals sinus rhythm at 55 bpm, right bundle branch block. No acute CT changes IMPRESSION AND PLAN:  Active Problems:   Acute CVA (cerebrovascular accident) (HCC)   Left sided numbness   Slurred speech   Essential hypertension   Diabetes (HCC) #1. Acute stroke with left-sided numbness, slurred speech, apparently started about a week ago, worsened yesterday, continue patient on aspirin, statin, get echocardiogram, MRI of the brain, PT, OT, SLP evaluations. Dysphagia 2 diet, aspiration precautions, discussed with the patient the risks.  #2. Left-sided numbness, supportive therapy, an MRI to evaluate extent of the stroke #3. Essential hypertension, hold blood pressure medications for now, permissive hypertension #4. Diabetes mellitus type 2, patient was noncompliant with his medications, did not take medications for the past one month, getting Management involved to help him with establish primary care physician in the area, resume diabetic medications, get hemoglobin A1c. Continue sliding scale insulin #5. Tobacco abuse. Counseling, discussed this patient for 4 minutes, nicotine replacement therapy will be initiated, patient is agreeable   All the records are reviewed and case discussed with ED provider. Management plans discussed with the patient, family and they are in agreement.  CODE STATUS: Code Status History    This patient does not have a recorded code  status. Please follow your organizational policy for patients in this situation.       TOTAL TIME TAKING CARE OF THIS PATIENT: 50 minutes.    Katharina Caper M.D on 11/10/2016 at 12:43 PM  Between 7am to 6pm - Pager - [phone] After 6pm go to [email or web address] - password [email or web address] [facility]  [facility]  Office  [phone]  CC: Primary care physician; No PCP Per Patient

## 2016-11-10 NOTE — ED Triage Notes (Signed)
States left hand and left lip tingling and numbness since last night at 10 pm, states symptoms were still present this AM, awake and alert

## 2016-11-10 NOTE — ED Provider Notes (Signed)
[facility] Emergency Department Provider Note  ____________________________________________  Time seen: Approximately 11:22 AM  I have reviewed the triage vital signs and the nursing notes.   HISTORY  Chief Complaint Numbness    HPI Evan Townsend is a 61 y.o. male who complains of left-sided paresthesia in the face arm and leg that started at 10 PM last night, still persistent today. Has been constant. Also has slurred speech. Never had anything like this before. He's been off of all of his medications for the past 3 weeks because he feels that they are unaffordable. Denies headache or syncope. No motor weakness.     Past Medical History:  Diagnosis Date  . Diabetes mellitus without complication [facility])      Patient Active Problem List   Diagnosis Date Noted  . Acute CVA (cerebrovascular accident) (HCC) 11/10/2016  . Left sided numbness 11/10/2016  . Slurred speech 11/10/2016  . Essential hypertension 11/10/2016  . Diabetes (HCC) 11/10/2016     History reviewed. No pertinent surgical history.   Prior to Admission medications   Medication Sig Start Date End Date Taking? Authorizing Provider  atorvastatin (LIPITOR) 40 MG tablet Take 1 tablet (40 mg total) by mouth daily. 03/08/16 03/08/17 Yes Myrna Blazer, MD  gabapentin (NEURONTIN) 300 MG capsule Take 1 capsule (300 mg total) by mouth 3 (three) times daily. 03/08/16 03/08/17 Yes Myrna Blazer, MD  glucose blood (FREESTYLE INSULINX TEST) test strip Use as instructed 03/08/16  Yes Myrna Blazer, MD  insulin NPH-regular Human (NOVOLIN 70/30) (70-30) 100 UNIT/ML injection Inject 0.60ml (16 units total) under the skin 2 (two) times a day with meals 03/08/16  Yes Myrna Blazer, MD  lisinopril (PRINIVIL,ZESTRIL) 5 MG tablet Take 1 tablet (5 mg total) by mouth daily. 03/08/16 03/08/17 Yes Myrna Blazer, MD  meloxicam (MOBIC) 15 MG tablet Take 1 tablet (15 mg  total) by mouth daily. 08/05/15  Yes Christiane Ha D Cuthriell, PA-C  saxagliptin HCl (ONGLYZA) 5 MG TABS tablet Take 1 tablet (5 mg total) by mouth daily. 03/08/16  Yes Myrna Blazer, MD  Lancets (FREESTYLE) lancets Use for glucose checks four times daily 03/08/16   Myrna Blazer, MD     Allergies Patient has no known allergies.   History reviewed. No pertinent family history.  Social History Social History  Substance Use Topics  . Smoking status: Current Every Day Smoker  . Smokeless tobacco: Never Used  . Alcohol use Yes    Review of Systems  Constitutional:   No fever or chills.  ENT:   No sore throat. No rhinorrhea. Cardiovascular:   No chest pain. Respiratory:   No dyspnea or cough. Gastrointestinal:   Negative for abdominal pain, vomiting and diarrhea.  Genitourinary:   Negative for dysuria or difficulty urinating. Musculoskeletal:   Negative for focal pain or swelling Neurological:   Negative for headaches. Positive paresthesias as above. 10-point ROS otherwise negative.  ____________________________________________   PHYSICAL EXAM:  VITAL SIGNS: ED Triage Vitals  Enc Vitals Group     BP 11/10/16 0847 (!) 162/76     Pulse Rate 11/10/16 0847 64     Resp 11/10/16 0847 18     Temp 11/10/16 0847 98.9 F (37.2 C)     Temp Source 11/10/16 0847 Oral     SpO2 11/10/16 0847 99 %     Weight 11/10/16 0847 150 lb (68 kg)     Height 11/10/16 0847 5\' 6"  (1.676 m)  Head Circumference --      Peak Flow --      Pain Score 11/10/16 0849 10     Pain Loc --      Pain Edu? --      Excl. in GC? --     Vital signs reviewed, nursing assessments reviewed.   Constitutional:   Alert and oriented. Not in distress. Eyes:   No scleral icterus. No conjunctival pallor. PERRL. EOMI.  No nystagmus. ENT   Head:   Normocephalic and atraumatic.   Nose:   No congestion/rhinnorhea. No septal hematoma   Mouth/Throat:   MMM, no pharyngeal erythema. No  peritonsillar mass.    Neck:   No stridor. No SubQ emphysema. No meningismus. Hematological/Lymphatic/Immunilogical:   No cervical lymphadenopathy. Cardiovascular:   RRR. Symmetric bilateral radial and DP pulses.  No murmurs.  Respiratory:   Normal respiratory effort without tachypnea nor retractions. Breath sounds are clear and equal bilaterally. No wheezes/rales/rhonchi. Gastrointestinal:   Soft and nontender. Non distended. There is no CVA tenderness.  No rebound, rigidity, or guarding. Genitourinary:   deferred Musculoskeletal:   Normal range of motion in all extremities. No joint effusions.  No lower extremity tenderness.  No edema. Neurologic:   Slurred speech, normal fluent language and word finding.  CN 2-10 normal. Motor grossly intact. NIH stroke scale 2 for sensory changes No gross focal neurologic deficits are appreciated.  Skin:    Skin is warm, dry and intact. No rash noted.  No petechiae, purpura, or bullae.  ____________________________________________    LABS (pertinent positives/negatives) (all labs ordered are listed, but only abnormal results are displayed) Labs Reviewed  GLUCOSE, CAPILLARY - Abnormal; Notable for the following:       Result Value   Glucose-Capillary 409 (*)    All other components within normal limits  URINALYSIS, COMPLETE (UACMP) WITH MICROSCOPIC - Abnormal; Notable for the following:    Color, Urine STRAW (*)    APPearance CLEAR (*)    Glucose, UA >=500 (*)    All other components within normal limits  COMPREHENSIVE METABOLIC PANEL - Abnormal; Notable for the following:    Glucose, Bld 402 (*)    Alkaline Phosphatase 137 (*)    All other components within normal limits  URINE DRUG SCREEN, QUALITATIVE ([facility] ONLY) - Abnormal; Notable for the following:    Cocaine Metabolite,Ur [place] POSITIVE (*)    All other components within normal limits  CBC  PROTIME-INR  APTT  DIFFERENTIAL  TROPONIN I  ETHANOL  CBG MONITORING, ED    ____________________________________________   EKG  Interpreted by me Sinus bradycardia rate of 55, left axis, right bundle branch block. No acute ischemic changes.  ____________________________________________    RADIOLOGY  Ct Angio Head W Or Wo Contrast  Result Date: 11/10/2016 CLINICAL DATA:  Left face and upper extremity paresthesias. Slurred speech. Symptoms since 10 p.m. yesterday. EXAM: CT ANGIOGRAPHY HEAD AND NECK TECHNIQUE: Multidetector CT imaging of the head and neck was performed using the standard protocol during bolus administration of intravenous contrast. Multiplanar CT image reconstructions and MIPs were obtained to evaluate the vascular anatomy. Carotid stenosis measurements (when applicable) are obtained utilizing NASCET criteria, using the distal internal carotid diameter as the denominator. CONTRAST:  75 cc Isovue 370 intravenous COMPARISON:  Head CT from earlier today FINDINGS: CTA NECK FINDINGS Aortic arch: Atheromatous wall thickening with noncalcified plaque best seen along the upper descending aorta. Two vessel branching pattern. Right carotid system: Noncalcified plaque at the carotid bulb with narrowing  measuring 30%. Posterior outpouching at the proximal ICA is likely normal bulb that is made prominent by more inferior noncalcified plaque. No suspected ulceration. No beading or dissection. Left carotid system: Smooth and widely patent. No notable atheromatous changes. Vertebral arteries: Noncalcified plaque at the non dominant right vertebral artery origin with mild stenosis. Elsewhere the vertebral arteries are smooth and widely patent. No proximal subclavian or brachiocephalic stenosis. Skeleton: Poor dentition with multiple periapical erosions. Other neck: No acute finding. Chronic sinusitis greatest in the left maxillary antrum. Subcentimeter nodule in the right thyroid, usually considered incidental at this size. Upper chest: No acute finding Review of the MIP  images confirms the above findings CTA HEAD FINDINGS Anterior circulation: Mild atheromatous narrowing of the bilateral cavernous ICA. No large vessel occlusion. Focal mild atheromatous type narrowing of the distal left M1 segment. Negative for aneurysm. Posterior circulation: Symmetric vertebral arteries. Large posterior communicating arteries. Mild atherosclerotic type irregularity of bilateral PCAs, with P2 narrowing greater on the right. Negative for aneurysm. Venous sinuses: Patent Anatomic variants: None significant Delayed phase: No abnormal intracranial enhancement. Review of the MIP images confirms the above findings IMPRESSION: 1. No emergent large vessel occlusion. 2. Noncalcified atherosclerosis in the neck with non-flow limiting narrowing of the proximal right ICA and right vertebral arteries. 3. Intracranial atherosclerosis with mild left M1 and bilateral PCA narrowings. Electronically Signed   By: Marnee SpringJonathon  Watts M.D.   On: 11/10/2016 10:52   Ct Head Wo Contrast  Result Date: 11/10/2016 CLINICAL DATA:  Left hand and facial numbness beginning at 10 p.m. last night. EXAM: CT HEAD WITHOUT CONTRAST TECHNIQUE: Contiguous axial images were obtained from the base of the skull through the vertex without intravenous contrast. COMPARISON:  Head CT scan 05/04/2009. FINDINGS: Brain: Appears normal without hemorrhage, infarct, mass lesion, mass effect, midline shift or abnormal extra-axial fluid collection. No hydrocephalus or pneumocephalus. Vascular: Negative. Skull: Intact. Sinuses/Orbits: Negative. Other: None. IMPRESSION: Negative head CT. Electronically Signed   By: Drusilla Kannerhomas  Dalessio M.D.   On: 11/10/2016 09:36   Ct Angio Neck W And/or Wo Contrast  Result Date: 11/10/2016 CLINICAL DATA:  Left face and upper extremity paresthesias. Slurred speech. Symptoms since 10 p.m. yesterday. EXAM: CT ANGIOGRAPHY HEAD AND NECK TECHNIQUE: Multidetector CT imaging of the head and neck was performed using the  standard protocol during bolus administration of intravenous contrast. Multiplanar CT image reconstructions and MIPs were obtained to evaluate the vascular anatomy. Carotid stenosis measurements (when applicable) are obtained utilizing NASCET criteria, using the distal internal carotid diameter as the denominator. CONTRAST:  75 cc Isovue 370 intravenous COMPARISON:  Head CT from earlier today FINDINGS: CTA NECK FINDINGS Aortic arch: Atheromatous wall thickening with noncalcified plaque best seen along the upper descending aorta. Two vessel branching pattern. Right carotid system: Noncalcified plaque at the carotid bulb with narrowing measuring 30%. Posterior outpouching at the proximal ICA is likely normal bulb that is made prominent by more inferior noncalcified plaque. No suspected ulceration. No beading or dissection. Left carotid system: Smooth and widely patent. No notable atheromatous changes. Vertebral arteries: Noncalcified plaque at the non dominant right vertebral artery origin with mild stenosis. Elsewhere the vertebral arteries are smooth and widely patent. No proximal subclavian or brachiocephalic stenosis. Skeleton: Poor dentition with multiple periapical erosions. Other neck: No acute finding. Chronic sinusitis greatest in the left maxillary antrum. Subcentimeter nodule in the right thyroid, usually considered incidental at this size. Upper chest: No acute finding Review of the MIP images confirms the above findings CTA  HEAD FINDINGS Anterior circulation: Mild atheromatous narrowing of the bilateral cavernous ICA. No large vessel occlusion. Focal mild atheromatous type narrowing of the distal left M1 segment. Negative for aneurysm. Posterior circulation: Symmetric vertebral arteries. Large posterior communicating arteries. Mild atherosclerotic type irregularity of bilateral PCAs, with P2 narrowing greater on the right. Negative for aneurysm. Venous sinuses: Patent Anatomic variants: None significant  Delayed phase: No abnormal intracranial enhancement. Review of the MIP images confirms the above findings IMPRESSION: 1. No emergent large vessel occlusion. 2. Noncalcified atherosclerosis in the neck with non-flow limiting narrowing of the proximal right ICA and right vertebral arteries. 3. Intracranial atherosclerosis with mild left M1 and bilateral PCA narrowings. Electronically Signed   By: Marnee Spring M.D.   On: 11/10/2016 10:52    ____________________________________________   PROCEDURES Procedures  ____________________________________________   INITIAL IMPRESSION / ASSESSMENT AND PLAN / ED COURSE  Pertinent labs & imaging results that were available during my care of the patient were reviewed by me and considered in my medical decision making (see chart for details).  Patient presents with strokelike symptoms. Out of window for TPA. CT head negative for bleeding or acute infarct. CT angiogram negative for intervenable lesion.  Aspirin, discussed with hospitalist for further stroke workup. Has multiple risk factors including hyperlipidemia, diabetes, hypertension, all of which are poorly controlled at present.        ____________________________________________   FINAL CLINICAL IMPRESSION(S) / ED DIAGNOSES  Final diagnoses:  Acute ischemic stroke (HCC)  Paresthesia  Hyperglycemia      New Prescriptions   No medications on file     Portions of this note were generated with dragon dictation software. Dictation errors may occur despite best attempts at proofreading.    Sharman Cheek, MD 11/10/16 1247

## 2016-11-10 NOTE — ED Notes (Signed)
Gave living well with diabetes book to patient and wife and discussed book.  Patient does not read and wife states she will read it to him.

## 2016-11-11 LAB — CBC
HCT: 38 % — ABNORMAL LOW (ref 40.0–52.0)
Hemoglobin: 13.1 g/dL (ref 13.0–18.0)
MCH: 30.8 pg (ref 26.0–34.0)
MCHC: 34.3 g/dL (ref 32.0–36.0)
MCV: 89.8 fL (ref 80.0–100.0)
Platelets: 227 10*3/uL (ref 150–440)
RBC: 4.24 MIL/uL — ABNORMAL LOW (ref 4.40–5.90)
RDW: 14.2 % (ref 11.5–14.5)
WBC: 7.2 10*3/uL (ref 3.8–10.6)

## 2016-11-11 LAB — HEMOGLOBIN A1C
Hgb A1c MFr Bld: 14.5 % — ABNORMAL HIGH (ref 4.8–5.6)
Mean Plasma Glucose: 369 mg/dL

## 2016-11-11 LAB — HIV ANTIBODY (ROUTINE TESTING W REFLEX): HIV Screen 4th Generation wRfx: NONREACTIVE

## 2016-11-11 LAB — BASIC METABOLIC PANEL
Anion gap: 5 (ref 5–15)
BUN: 22 mg/dL — ABNORMAL HIGH (ref 6–20)
CO2: 26 mmol/L (ref 22–32)
Calcium: 8.8 mg/dL — ABNORMAL LOW (ref 8.9–10.3)
Chloride: 105 mmol/L (ref 101–111)
Creatinine, Ser: 1.07 mg/dL (ref 0.61–1.24)
GFR calc Af Amer: >60 mL/min (ref >60)
GFR calc non Af Amer: >60 mL/min (ref >60)
Glucose, Bld: 240 mg/dL — ABNORMAL HIGH (ref 65–99)
Potassium: 3.8 mmol/L (ref 3.5–5.1)
Sodium: 136 mmol/L (ref 135–145)

## 2016-11-11 LAB — TROPONIN I: Troponin I: <0.03 ng/mL (ref <0.03)

## 2016-11-11 LAB — GLUCOSE, CAPILLARY
Glucose-Capillary: 224 mg/dL — ABNORMAL HIGH (ref 65–99)
Glucose-Capillary: 145 mg/dL — ABNORMAL HIGH (ref 65–99)

## 2016-11-11 MED ORDER — MELOXICAM 15 MG PO TABS: 15.0000 mg | ORAL_TABLET | Freq: Every day | ORAL | 0 refills | Status: DC

## 2016-11-11 MED ORDER — INSULIN NPH ISOPHANE & REGULAR (70-30) 100 UNIT/ML ~~LOC~~ SUSP: SUBCUTANEOUS | 0 refills | Status: DC

## 2016-11-11 MED ORDER — NICOTINE 21 MG/24HR TD PT24: 21.0000 mg | MEDICATED_PATCH | Freq: Every day | TRANSDERMAL | 0 refills | Status: DC

## 2016-11-11 MED ORDER — LIVING WELL WITH DIABETES BOOK
Freq: Once | Status: AC
  Administered 2016-11-11: 12:00:00
  Filled 2016-11-11: qty 1

## 2016-11-11 MED ORDER — LISINOPRIL 5 MG PO TABS: 5.0000 mg | ORAL_TABLET | Freq: Every day | ORAL | 0 refills | Status: DC

## 2016-11-11 MED ORDER — ATORVASTATIN CALCIUM 40 MG PO TABS: 40.0000 mg | ORAL_TABLET | Freq: Every day | ORAL | 0 refills | Status: DC

## 2016-11-11 MED ORDER — GABAPENTIN 300 MG PO CAPS: 300.0000 mg | ORAL_CAPSULE | Freq: Three times a day (TID) | ORAL | 0 refills | Status: DC

## 2016-11-11 MED ORDER — ASPIRIN 81 MG PO TBEC: 81.0000 mg | DELAYED_RELEASE_TABLET | Freq: Every day | ORAL | Status: DC

## 2016-11-11 NOTE — Progress Notes (Signed)
SLP Cancellation Note  Patient Details Name: Evan Townsend MRN: [id number] DOB: 1956/01/26   Cancelled treatment:       Reason Eval/Treat Not Completed: SLP screened, no needs identified, will sign off (Consulted NSG, reviewed chart ) Pt denied any trouble eating/swallowing with breakfast this morning, tray still in room- noted pt consumed entire breakfast and drink. NSG denied any concerns for pt's swallowing, noting pt did fine swallowing whole pills. Pt request cup of coffee, no deficits with speech/language noted either. No further skilled ST services indicated, NSG to re-consult if any change in status.     Ardelia MemsHannah Myli Pae, B.S Graduate Clinician  11/11/2016, 9:14 AM

## 2016-11-11 NOTE — Progress Notes (Addendum)
Inpatient Diabetes Program Recommendations  AACE/ADA: New Consensus Statement on Inpatient Glycemic Control (2015)  Target Ranges:  Prepandial:   less than 140 mg/dL      Peak postprandial:   less than 180 mg/dL (1-2 hours)      Critically ill patients:  140 - 180 mg/dL   Results for Evan Townsend, Evan Townsend (MRN [id number]) as of 11/11/2016 08:26  Ref. Range 11/10/2016 08:53 11/10/2016 12:48 11/10/2016 16:30 11/10/2016 16:34 11/10/2016 20:15 11/10/2016 22:04 11/11/2016 07:44  Glucose-Capillary Latest Ref Range: 65 - 99 mg/dL 409409 (H) 811307 (H) 914416 (H) 392 (H) 66 245 (H) 224 (H)    Admit with: CVA  History: DM  Home DM Meds: 70/30 Insulin- 16 units BID       Onglyza 5 mg daily       Has been off meds for 1 month due to [place]  Current Insulin Orders: 70/30 Insulin- 16 units BID      Novolog Moderate Correction Scale/ SSI (0-15 units) TID AC + HS      Novolog 4 units with Lunch     -Note [place] [facility] [place] with [facility].  Care management has been consulted to help pt establish care in the area.  -Current A1c pending.  Expect A1c to be elevated given pt has been off his DM meds for 1 month.  -Note 70/30 insulin and Novolog SSI started last PM.  -Plan to speak with pt today to assess any other needs he may have at this time.    MD- When preparing patient for discharge, please keep cost considerations in mind.  Patient is currently listed as having no medical insurance.  70/30 [facility]ial.    May want to stop home Onglyza 5 mg daily.  Not sure patient will be able to afford the Onglyza out of pocket??       --Will follow patient during hospitalization--  Ambrose FinlandJeannine Johnston Dowell Hoon RN, MSN, CDE Diabetes Coordinator Inpatient Glycemic Control Team Team Pager: [phone] (8a-5p)

## 2016-11-11 NOTE — Discharge Instructions (Addendum)
[facility] [place] [facility] ° °DIET:  °Diabetic diet, cardiac diet ° °DISCHARGE CONDITION:  °Stable ° °ACTIVITY:  °Activity as tolerated ° °OXYGEN:  °Home Oxygen: No. °  °Oxygen Delivery: room air ° °DISCHARGE LOCATION:  °home  ° ° °ADDITIONAL DISCHARGE INSTRUCTION: ° ° °If you experience worsening of your admission symptoms, develop shortness of breath, life threatening emergency, suicidal or homicidal thoughts you must seek medical attention immediately by calling 911 or calling your MD immediately  if symptoms less severe. ° °You Must read complete instructions/literature along with all the possible adverse reactions/side effects for all the Medicines you take and that have been prescribed to you. Take any new Medicines after you have completely understood and accpet all the possible adverse reactions/side effects.  ° °Please note ° °You were cared for by a hospitalist during your hospital stay. If you have any questions about your discharge medications or the care you received while you were in the hospital after you are discharged, you can call the unit and asked to speak with the hospitalist on call if the hospitalist that took care of you is not available. Once you are discharged, your primary care physician will handle any further medical issues. Please note that NO REFILLS for any discharge medications will be authorized once you are discharged, as it is imperative that you return to your primary care physician (or establish a relationship with a primary care physician if you do not have one) for your aftercare needs so that they can reassess your need for medications and monitor your lab values. ° ° °

## 2016-11-11 NOTE — Evaluation (Signed)
Occupational Therapy Evaluation Patient Details Name: Evan Townsend MRN: [id number] DOB: 11-Jun-1956 Today's Date: 11/11/2016    History of Present Illness Pt is a 61 y.o. male with a known history of HTN, peripheral neuropathy, diabetes mellitus (insulin dependent), who is off his medications for about 1 month, due to inability to reach his primary care physician at [facility], family practice, who presents to the hospital on 11/10/16 with complaints of left-sided paresthesias, slurred speech. MRI indicates acute infarct right thalamus and possible subacute infarct in the right corona radiata.    Clinical Impression   Pt seen for OT evaluation this date. PTA, pt was independent with ADL, IADL, driving, and working and is eager to return to PLOF. Pt presents at baseline functional independence with self care tasks including LB dressing, grooming, self feeding, and toileting, BUE ROM and strength WFL, with continued decreased sensation (tingling) to L cheek, LUE forearm and hand with minimal to no functional impact on fine motor coordination and ability to manipulate objects using his L hand when needed (R hand dominant). Sharp/dull sensation intact. Pt able to verbally demonstrate ability to manage medications at home with finances/access being the most significant barrier identified to adherence. No additional OT needs identified. OT will sign off. May benefit from additional support from case manager to address med mgt and identify community resources available (medications, glasses, dentures) to support safety and independence as well as minimize risk of decline in functional independence and rehospitalization. Please re-consult OT if there is a change in pt status or additional OT needs are identified.    Follow Up Recommendations  No OT follow up    Equipment Recommendations  None recommended by OT    Recommendations for Other Services       Precautions / Restrictions  Precautions Precautions: None Restrictions Weight Bearing Restrictions: No      Mobility Bed Mobility Overal bed mobility: Independent             General bed mobility comments: no LOB noted, safe technique  Transfers Overall transfer level: Independent               General transfer comment: no LOB, safe technique    Balance Overall balance assessment: No apparent balance deficits (not formally assessed)                                         ADL either performed or assessed with clinical judgement   ADL Overall ADL's : At baseline                                       General ADL Comments: Pt able to perform all bed mobility, toileting, grooming, and LB dressing tasks at baseline independent level, no LOB noted     Vision Baseline Vision/History: No visual deficits Patient Visual Report: Blurring of vision Vision Assessment?: Yes Eye Alignment: Within Functional Limits Ocular Range of Motion: Within Functional Limits Alignment/Gaze Preference: Within Defined Limits Tracking/Visual Pursuits: Able to track stimulus in all quads without difficulty Convergence: Within functional limits Visual Fields: No apparent deficits Additional Comments: Pt reports he has not gone to the eye doctor in a long time but reports blurry distance vision and "I know I need glasses"     Perception  Praxis Praxis Praxis tested?: Within functional limits    Pertinent Vitals/Pain Pain Assessment: No/denies pain     Hand Dominance Right   Extremity/Trunk Assessment Upper Extremity Assessment Upper Extremity Assessment: Overall WFL for tasks assessed;LUE deficits/detail LUE Deficits / Details: ROM/strength WFL, pt reports decreased sensation to L forearm and hand; no difficulty manipulating small objects, good even strength/grip/pinch LUE Sensation: decreased light touch;history of peripheral neuropathy   Lower Extremity  Assessment Lower Extremity Assessment: Defer to PT evaluation;Overall [facility] for tasks assessed   Cervical / Trunk Assessment Cervical / Trunk Assessment: Normal   Communication Communication Communication: No difficulties   Cognition Arousal/Alertness: Awake/alert Behavior During Therapy: WFL for tasks assessed/performed Overall Cognitive Status: Within Functional Limits for tasks assessed                                     General Comments  poor dentition, reports he needs dentures    Exercises Other Exercises Other Exercises: Pt instructed in general hand grip/pinch exercises to perform at home and encouraged to continue using LUE as usual   Shoulder Instructions      Home Living Family/patient expects to be discharged to:: Private residence Living Arrangements: Spouse/significant other Available Help at Discharge: Family;Available 24 hours/day;Available PRN/intermittently Type of Home: Mobile home Home Access: Stairs to enter Entrance Stairs-Number of Steps: 3 Entrance Stairs-Rails: Right Home Layout: One level     Bathroom Shower/Tub: Chief Strategy OfficerTub/shower unit   Bathroom Toilet: Standard Bathroom Accessibility: Yes How Accessible: Accessible via walker Home Equipment: Cane - single point          Prior Functioning/Environment Level of Independence: Independent        Comments: Pt independent with ADL, mobility, driving, working "some" in concrete; was using a cane for ambulation some time ago when he had knee pain, but was not using AD for ambulation recently        OT Problem List:        OT Treatment/Interventions:      OT Goals(Current goals can be found in the care plan section)    OT Frequency:     Barriers to D/C:            Co-evaluation              End of Session    Activity Tolerance: Patient tolerated treatment well Patient left: in bed;with call bell/phone within reach;with bed alarm set;with family/visitor  present  OT Visit Diagnosis: Other symptoms and signs involving cognitive function                Time: 1000-1020 OT Time Calculation (min): 20 min Charges:  OT General Charges $OT Visit: 1 Procedure OT Evaluation $OT Eval Low Complexity: 1 Procedure G-Codes:     Richrd PrimeJamie Stiller, MPH, MS, OTR/L ascom [phone] 11/11/16, 10:47 AM

## 2016-11-11 NOTE — Evaluation (Signed)
Physical Therapy Evaluation Patient Details Name: Evan Townsend MRN: [id number] DOB: 1956/05/19 Today's Date: 11/11/2016   History of Present Illness  Pt is a 61 y.o. male with a known history of HTN, peripheral neuropathy, diabetes mellitus (insulin dependent), who is off his medications for about 1 month, due to inability to reach his primary care physician at [facility], who presents to the hospital on 11/10/16 with complaints of left-sided paresthesias, slurred speech. MRI indicates acute infarct right thalamus and possible subacute infarct in the right corona radiata.    Clinical Impression  Pt A&Ox4, no noticeable difficulties w/ communication. 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 He displayed some diminished light touch sensation on the L side (in the UE and face) compared to the R. Overall strength is 5/5 throughout UE and LE and patient is independent w/ bed mobility and transfers. He ambulated 250 ft w/ safe functional gait speeds and pattern. He scored 24/24 on DGI indicating pt is at a low risk of falling. Overall patient is independent in all mobility tasks assessed and does not present w/ any current PT needs, will complete orders at this time, please resubmit orders if pt's status changes.     Follow Up Recommendations No PT follow up    Equipment Recommendations  None recommended by PT    Recommendations for Other Services       Precautions / Restrictions Precautions Precautions: None Restrictions Weight Bearing Restrictions: No      Mobility  Bed Mobility Overal bed mobility: Independent             General bed mobility comments: no LOB noted, safe technique  Transfers Overall transfer level: Independent               General transfer comment: no LOB, safe technique  Ambulation/Gait Ambulation/Gait assistance: Supervision Ambulation Distance (Feet): 250 Feet Assistive  device: None Gait Pattern/deviations: Step-through pattern;Antalgic   Gait velocity interpretation: at or above normal speed for age/gender General Gait Details: some pain/limping when standing on the L Leg due to history of L knee pain, safe ambulation, no LOB dizziness, blurred vision or staggering   Stairs Stairs: Yes Stairs assistance: Supervision Stair Management: No rails Number of Stairs: 5 General stair comments: safely able to ascend steps w/ step through pattern, descends w/ safe step to pattern due to L knee pain, good safe technique  Wheelchair Mobility    Modified Rankin (Stroke Patients Only)       Balance Overall balance assessment: Independent               Single Leg Stance - Right Leg: 15 (s) Single Leg Stance - Left Leg: 15 (s) Tandem Stance - Right Leg: 15 (s) Tandem Stance - Left Leg: 15 (s) Rhomberg - Eyes Opened: 30 (s) Rhomberg - Eyes Closed: 30 (s)   High Level Balance Comments: albe to pick up objects from the floor w/o difficulty or LOB, DGI 24/24 = good balance Standardized Balance Assessment Standardized Balance Assessment : Dynamic Gait Index   Dynamic Gait Index Level Surface: Normal Change in Gait Speed: Normal Gait with Horizontal Head Turns: Normal Gait with Vertical Head Turns: Normal Gait and Pivot Turn: Normal Step Over Obstacle: Normal Step Around Obstacles: Normal Steps: Normal Total Score: 24       Pertinent Vitals/Pain Pain Assessment: No/denies pain    Home Living Family/patient expects to be discharged to:: Private residence Living  Arrangements: Spouse/significant other Available Help at Discharge: Family;Available 24 hours/day;Available PRN/intermittently Type of Home: Mobile home Home Access: Stairs to enter Entrance Stairs-Rails: Right Entrance Stairs-Number of Steps: 3 Home Layout: One level Home Equipment: Cane - single point      Prior Function Level of Independence: Independent          Comments: Pt independent with ADL, mobility, driving, working "some" in concrete; was using a cane for ambulation some time ago when he had knee pain, but was not using AD for ambulation recently     Hand Dominance   Dominant Hand: Right    Extremity/Trunk Assessment   Upper Extremity Assessment Upper Extremity Assessment: LUE deficits/detail LUE Deficits / Details: strength WFLs 5/5 LUE Sensation: decreased light touch    Lower Extremity Assessment Lower Extremity Assessment: Overall WFL for tasks assessed    Cervical / Trunk Assessment Cervical / Trunk Assessment: Normal  Communication   Communication: No difficulties  Cognition Arousal/Alertness: Awake/alert Behavior During Therapy: WFL for tasks assessed/performed Overall Cognitive Status: Within Functional Limits for tasks assessed                                        General Comments General comments (skin integrity, edema, etc.): poor dentition, reports he needs dentures    Exercises Other Exercises Other Exercises: Pt instructed in general hand grip/pinch exercises to perform at home and encouraged to continue using LUE as usual   Assessment/Plan    PT Assessment Patent does not need any further PT services  PT Problem List         PT Treatment Interventions      PT Goals (Current goals can be found in the Care Plan section)  Acute Rehab PT Goals Patient Stated Goal: Return home PT Goal Formulation: With patient/family Time For Goal Achievement: 11/25/16 Potential to Achieve Goals: Good    Frequency     Barriers to discharge        Co-evaluation               End of Session Equipment Utilized During Treatment: Gait belt Activity Tolerance: Patient tolerated treatment well Patient left: in bed;with family/visitor present;with call bell/phone within reach Nurse Communication: Mobility status      Time: 1610-9604 PT Time Calculation (min) (ACUTE ONLY): 10  min   Charges:         PT G Codes:        [employer]  Student PT 11/11/16, 11:37 AM [phone]   Liberato Stansbery 11/11/2016, 11:32 AM

## 2016-11-11 NOTE — Plan of Care (Signed)
Problem: Tissue Perfusion: Goal: Complications of Ischemic Stroke will be minimized (choose ONE based on patient diagnosis) Outcome: Progressing Importance of monitoring blood pressure and blood sugar and compliance with medication

## 2016-11-11 NOTE — Plan of Care (Signed)
MD making rounds. Received order to discharge home. IV removed. Patient provided with Education Handouts. Prescriptions given to patient. Discharge paperwork provided, explained, signed and witnessed. No questions left unanswered. Discharged via wheelchair by auxiliary staff. Belongings sent with patient and family. 

## 2016-11-11 NOTE — Progress Notes (Signed)
Spoke with patient and his wife today.  Stopped DM meds about 1 month ago b/c he ran out and could not get to his PCP in [place].  Has been having symptoms of Hyperglycemia at home.  Pt's wife told me they are trying to get patient established with Medicaid but that he keeps getting denied.  [place] and have been getting pt's meds [facility] [place] prior to running out.  Has not been checking CBGs and needs new CBG meter.  Explained to pt that we have drawn an A1c level for him.  Explained what an A1c is and what it measures.  Reminded patient that his goal A1c is 7% or less per ADA standards to prevent both acute and long-term complications.  Explained to patient the extreme importance of good glucose control at home.  Encouraged patient to check his CBGs at least bid at home and to record all CBGs in a logbook for his PCP to review.  Also reviewed basic home care, basic diabetes diet nutrition principles, importance of checking CBGs and maintaining good CBG control to prevent long-term and short-term complications.  Reviewed signs and symptoms of hyperglycemia and hypoglycemia and how to treat hypoglycemia at home.  Also reviewed blood sugar goals and A1c goals for home.    Have ordered the Living Well With DM booklet for pt to take home with him.  Also placed nutrition consult for diet education reinforcement.  Waiting on Care Management to see pt.  Not sure if patient is still current at the [facility].  Gave patient information on purchasing an inexpensive CBG meter and strips at [employer].  Meter is $9 and a box of 50 strips is $9 (if patient cannot get a meter through the [facility])     --Will follow patient during hospitalization--  Evan FinlandJeannine Johnston Keydi Giel RN, MSN, CDE Diabetes Coordinator Inpatient Glycemic Control Team Team Pager: [phone] (8a-5p)

## 2016-11-11 NOTE — Care Management (Signed)
Admitted to [facility] with the diagnosis of acute CVA. Lives with family. Last seen Dr. Johny ChessKaysin in 2016. Information for [facility] and [facility] given.  Prescriptions faxed over to [facility]. Will update Smiley HousemanLorrie Carter at [facility]. Wife will transport. Gwenette GreetBrenda S Cailan General RN MSN CCM Care Management

## 2016-11-11 NOTE — Discharge Summary (Signed)
[facility] [place] at [facility], 61 y.o., DOB 10-Aug-1956, MRN [id number]. Admission date: 11/10/2016 Discharge Date 11/11/2016 Primary MD No PCP Per Patient Admitting Physician Katharina Caper, MD  Admission Diagnosis  Paresthesia [R20.2] Hyperglycemia [R73.9] Cerebral infarction [facility]) [I63.9] CVA (cerebral infarction) [I63.9] Acute ischemic stroke [facility]) [I63.9]  Discharge Diagnosis   Active Problems:   Acute CVA (cerebrovascular accident) (HCC) 5 mm acute infarct right thalamus   Left sided numbness   Slurred speech   Essential hypertension   Diabetes (HCC)   Nicotine abuse        Hospital Course  Evan Townsend  is a 61 y.o. male with a known history of Hypertension, peripheral neuropathy, peripheral neuropathy  diabetes mellitus, who is off his medications for about 1 month, due to inability to reach his primary care physician at [facility], family practice, who presents to the hospital with complaints of left-sided paresthesias, slurred speech. Patient came in to the ED with these symptoms. And he was admitted for further evaluation and therapy. Patient underwent a CT angiogram of the head and neck which showed some plaque but no significant stenosis. MRI did show a small 5 mm acute infarct in the right thalamus. Patient was seen by occupational therapy does have some numbness persistent but other than that no difficulty with walking or dysphasia. Patient is doing much better and is stable for discharge. His echocardiogram of the heart is not been read yet but I have given his wife my number so she can call me and I will give him the results.           Consults  None  Significant Tests:  See full reports for all details    Ct Angio Head W Or Wo Contrast  Result Date: 11/10/2016 CLINICAL DATA:  Left face and upper extremity paresthesias. Slurred speech. Symptoms since 10 p.m. yesterday. EXAM: CT ANGIOGRAPHY HEAD AND NECK TECHNIQUE:  Multidetector CT imaging of the head and neck was performed using the standard protocol during bolus administration of intravenous contrast. Multiplanar CT image reconstructions and MIPs were obtained to evaluate the vascular anatomy. Carotid stenosis measurements (when applicable) are obtained utilizing NASCET criteria, using the distal internal carotid diameter as the denominator. CONTRAST:  75 cc Isovue 370 intravenous COMPARISON:  Head CT from earlier today FINDINGS: CTA NECK FINDINGS Aortic arch: Atheromatous wall thickening with noncalcified plaque best seen along the upper descending aorta. Two vessel branching pattern. Right carotid system: Noncalcified plaque at the carotid bulb with narrowing measuring 30%. Posterior outpouching at the proximal ICA is likely normal bulb that is made prominent by more inferior noncalcified plaque. No suspected ulceration. No beading or dissection. Left carotid system: Smooth and widely patent. No notable atheromatous changes. Vertebral arteries: Noncalcified plaque at the non dominant right vertebral artery origin with mild stenosis. Elsewhere the vertebral arteries are smooth and widely patent. No proximal subclavian or brachiocephalic stenosis. Skeleton: Poor dentition with multiple periapical erosions. Other neck: No acute finding. Chronic sinusitis greatest in the left maxillary antrum. Subcentimeter nodule in the right thyroid, usually considered incidental at this size. Upper chest: No acute finding Review of the MIP images confirms the above findings CTA HEAD FINDINGS Anterior circulation: Mild atheromatous narrowing of the bilateral cavernous ICA. No large vessel occlusion. Focal mild atheromatous type narrowing of the distal left M1 segment. Negative for aneurysm. Posterior circulation: Symmetric vertebral arteries. Large posterior communicating arteries. Mild atherosclerotic type irregularity of bilateral PCAs, with P2 narrowing  greater on the right. Negative for  aneurysm. Venous sinuses: Patent Anatomic variants: None significant Delayed phase: No abnormal intracranial enhancement. Review of the MIP images confirms the above findings IMPRESSION: 1. No emergent large vessel occlusion. 2. Noncalcified atherosclerosis in the neck with non-flow limiting narrowing of the proximal right ICA and right vertebral arteries. 3. Intracranial atherosclerosis with mild left M1 and bilateral PCA narrowings. Electronically Signed   By: Marnee Spring M.D.   On: 11/10/2016 10:52   Ct Head Wo Contrast  Result Date: 11/10/2016 CLINICAL DATA:  Left hand and facial numbness beginning at 10 p.m. last night. EXAM: CT HEAD WITHOUT CONTRAST TECHNIQUE: Contiguous axial images were obtained from the base of the skull through the vertex without intravenous contrast. COMPARISON:  Head CT scan 05/04/2009. FINDINGS: Brain: Appears normal without hemorrhage, infarct, mass lesion, mass effect, midline shift or abnormal extra-axial fluid collection. No hydrocephalus or pneumocephalus. Vascular: Negative. Skull: Intact. Sinuses/Orbits: Negative. Other: None. IMPRESSION: Negative head CT. Electronically Signed   By: Drusilla Kanner M.D.   On: 11/10/2016 09:36   Ct Angio Neck W And/or Wo Contrast  Result Date: 11/10/2016 CLINICAL DATA:  Left face and upper extremity paresthesias. Slurred speech. Symptoms since 10 p.m. yesterday. EXAM: CT ANGIOGRAPHY HEAD AND NECK TECHNIQUE: Multidetector CT imaging of the head and neck was performed using the standard protocol during bolus administration of intravenous contrast. Multiplanar CT image reconstructions and MIPs were obtained to evaluate the vascular anatomy. Carotid stenosis measurements (when applicable) are obtained utilizing NASCET criteria, using the distal internal carotid diameter as the denominator. CONTRAST:  75 cc Isovue 370 intravenous COMPARISON:  Head CT from earlier today FINDINGS: CTA NECK FINDINGS Aortic arch: Atheromatous wall thickening  with noncalcified plaque best seen along the upper descending aorta. Two vessel branching pattern. Right carotid system: Noncalcified plaque at the carotid bulb with narrowing measuring 30%. Posterior outpouching at the proximal ICA is likely normal bulb that is made prominent by more inferior noncalcified plaque. No suspected ulceration. No beading or dissection. Left carotid system: Smooth and widely patent. No notable atheromatous changes. Vertebral arteries: Noncalcified plaque at the non dominant right vertebral artery origin with mild stenosis. Elsewhere the vertebral arteries are smooth and widely patent. No proximal subclavian or brachiocephalic stenosis. Skeleton: Poor dentition with multiple periapical erosions. Other neck: No acute finding. Chronic sinusitis greatest in the left maxillary antrum. Subcentimeter nodule in the right thyroid, usually considered incidental at this size. Upper chest: No acute finding Review of the MIP images confirms the above findings CTA HEAD FINDINGS Anterior circulation: Mild atheromatous narrowing of the bilateral cavernous ICA. No large vessel occlusion. Focal mild atheromatous type narrowing of the distal left M1 segment. Negative for aneurysm. Posterior circulation: Symmetric vertebral arteries. Large posterior communicating arteries. Mild atherosclerotic type irregularity of bilateral PCAs, with P2 narrowing greater on the right. Negative for aneurysm. Venous sinuses: Patent Anatomic variants: None significant Delayed phase: No abnormal intracranial enhancement. Review of the MIP images confirms the above findings IMPRESSION: 1. No emergent large vessel occlusion. 2. Noncalcified atherosclerosis in the neck with non-flow limiting narrowing of the proximal right ICA and right vertebral arteries. 3. Intracranial atherosclerosis with mild left M1 and bilateral PCA narrowings. Electronically Signed   By: Marnee Spring M.D.   On: 11/10/2016 10:52   Mr Brain Wo  Contrast  Result Date: 11/10/2016 CLINICAL DATA:  Cerebral infarction.  Left-sided paresthesia EXAM: MRI HEAD WITHOUT CONTRAST TECHNIQUE: Multiplanar, multiecho pulse sequences of the brain and surrounding structures were  obtained without intravenous contrast. COMPARISON:  CT head 11/10/2016 FINDINGS: Brain: Ventricle size normal.  Cerebral volume normal. 5 mm acute infarct right lateral thalamus. Subtle area of diffusion signal in the corona radiata on the right. Probable slight restricted diffusion on ADC map. Possible subacute infarct. Scattered small white matter hyperintensities bilaterally. Susceptibility in the right occipital parietal periventricular white matter which may be due to blood products. No acute hemorrhage is seen on today's CT. No mass or edema. No shift of the midline structures. Vascular: Normal arterial flow void. Skull and upper cervical spine: Negative Sinuses/Orbits: Mucosal edema paranasal sinuses.  Normal orbit. Other: None IMPRESSION: 5 mm acute infarct right thalamus. Possible subacute infarct in the right corona radiata Right occipital parietal periventricular white matter susceptibility most likely due to hemorrhage of indeterminate age. No acute hemorrhage on today's CT. Short-term follow-up CT recommended. Electronically Signed   By: Marlan Palau M.D.   On: 11/10/2016 14:07       Today   Subjective:   Evan Townsend  still has some left-sided numbness  Objective:   Blood pressure (!) 143/70, pulse (!) 52, temperature 98 F (36.7 C), temperature source Oral, resp. rate 20, height 5\' 5"  (1.651 m), weight 142 lb 7 oz (64.6 kg), SpO2 100 %.  .  Intake/Output Summary (Last 24 hours) at 11/11/16 1324 Last data filed at 11/11/16 0800  Gross per 24 hour  Intake              240 ml  Output                0 ml  Net              240 ml    Exam VITAL SIGNS: Blood pressure (!) 143/70, pulse (!) 52, temperature 98 F (36.7 C), temperature source Oral, resp. rate 20,  height 5\' 5"  (1.651 m), weight 142 lb 7 oz (64.6 kg), SpO2 100 %.  GENERAL:  61 y.o.-year-old patient lying in the bed with no acute distress.  EYES: Pupils equal, round, reactive to light and accommodation. No scleral icterus. Extraocular muscles intact.  HEENT: Head atraumatic, normocephalic. Oropharynx and nasopharynx clear.  NECK:  Supple, no jugular venous distention. No thyroid enlargement, no tenderness.  LUNGS: Normal breath sounds bilaterally, no wheezing, rales,rhonchi or crepitation. No use of accessory muscles of respiration.  CARDIOVASCULAR: S1, S2 normal. No murmurs, rubs, or gallops.  ABDOMEN: Soft, nontender, nondistended. Bowel sounds present. No organomegaly or mass.  EXTREMITIES: No pedal edema, cyanosis, or clubbing.  NEUROLOGIC: Cranial nerves II through XII are intact. Muscle strength 5/5 in all extremities. Sensation intact. Gait not checked. Left upper extremity diminished sensation  PSYCHIATRIC: The patient is alert and oriented x 3.  SKIN: No obvious rash, lesion, or ulcer.   Data Review     CBC w Diff: Lab Results  Component Value Date   WBC 7.2 11/11/2016   HGB 13.1 11/11/2016   HGB 15.6 03/15/2013   HCT 38.0 (L) 11/11/2016   HCT 45.7 03/15/2013   PLT 227 11/11/2016   PLT 188 03/15/2013   LYMPHOPCT 35 11/10/2016   LYMPHOPCT 44.9 03/15/2013   MONOPCT 9 11/10/2016   MONOPCT 8.1 03/15/2013   EOSPCT 2 11/10/2016   EOSPCT 1.6 03/15/2013   BASOPCT 1 11/10/2016   BASOPCT 0.9 03/15/2013   CMP: Lab Results  Component Value Date   NA 136 11/11/2016   NA 135 (L) 03/15/2013   K 3.8 11/11/2016   K 4.3 03/15/2013  CL 105 11/11/2016   CL 97 (L) 03/15/2013   CO2 26 11/11/2016   CO2 30 03/15/2013   BUN 22 (H) 11/11/2016   BUN 21 (H) 03/15/2013   CREATININE 1.07 11/11/2016   CREATININE 1.30 03/15/2013   PROT 7.3 11/10/2016   PROT 7.9 03/15/2013   ALBUMIN 4.0 11/10/2016   ALBUMIN 4.0 03/15/2013   BILITOT 0.4 11/10/2016   BILITOT 0.2 03/15/2013    ALKPHOS 137 (H) 11/10/2016   ALKPHOS 131 03/15/2013   AST 29 11/10/2016   AST 11 (L) 03/15/2013   ALT 27 11/10/2016   ALT 31 03/15/2013  .  Micro Results No results found for this or any previous visit (from the past 240 hour(s)).      Code Status Orders        Start     Ordered   11/10/16 1448  Full code  Continuous     11/10/16 1447    Code Status History    Date Active Date Inactive Code Status Order ID Comments User Context   This patient has a current code status but no historical code status.          Follow-up Information    [facility] Follow up in 1 week(s).   Specialty:  Primary Care Contact information: [address] [place] [id number] [phone]          Discharge Medications   Allergies as of 11/11/2016   No Known Allergies     Medication List    STOP taking these medications   saxagliptin HCl 5 MG Tabs tablet Commonly known as:  ONGLYZA     TAKE these medications   aspirin 81 MG EC tablet Take 1 tablet (81 mg total) by mouth daily. Start taking on:  11/12/2016   atorvastatin 40 MG tablet Commonly known as:  LIPITOR Take 1 tablet (40 mg total) by mouth daily.   freestyle lancets Use for glucose checks four times daily   gabapentin 300 MG capsule Commonly known as:  NEURONTIN Take 1 capsule (300 mg total) by mouth 3 (three) times daily.   glucose blood test strip Commonly known as:  FREESTYLE INSULINX TEST Use as instructed   insulin NPH-regular Human (70-30) 100 UNIT/ML injection Commonly known as:  NOVOLIN 70/30 Inject 0.7116ml (16 units total) under the skin 2 (two) times a day with meals   lisinopril 5 MG tablet Commonly known as:  PRINIVIL,ZESTRIL Take 1 tablet (5 mg total) by mouth daily.   meloxicam 15 MG tablet Commonly known as:  MOBIC Take 1 tablet (15 mg total) by mouth daily.   nicotine 21 mg/24hr patch Commonly known as:  NICODERM CQ - dosed in mg/24  hours Place 1 patch (21 mg total) onto the skin daily. Start taking on:  11/12/2016          Total Time in preparing paper work, data evaluation and todays exam - 35 minutes  Auburn BilberryPATEL, Ayauna Mcnay M.D on 11/11/2016 at 1:24 PM  [facility]   Office  [phone]

## 2016-11-12 LAB — ECHOCARDIOGRAM COMPLETE
Height: 65 in
Weight: 2279 oz

## 2016-11-24 ENCOUNTER — Ambulatory Visit: Payer: Self-pay

## 2016-12-03 ENCOUNTER — Ambulatory Visit: Payer: Self-pay | Admitting: Adult Health Nurse Practitioner

## 2016-12-03 VITALS — BP 199/52 | HR 51 | Temp 97.9°F | Ht 65.0 in | Wt 154.9 lb

## 2016-12-03 DIAGNOSIS — Z794 Long term (current) use of insulin: Secondary | ICD-10-CM

## 2016-12-03 DIAGNOSIS — E119 Type 2 diabetes mellitus without complications: Secondary | ICD-10-CM

## 2016-12-03 LAB — GLUCOSE, POCT (MANUAL RESULT ENTRY): POC Glucose: 354 mg/dl — AB (ref 70–99)

## 2016-12-03 MED ORDER — GLIPIZIDE 5 MG PO TABS: 5.0000 mg | ORAL_TABLET | Freq: Every day | ORAL | 2 refills | Status: DC

## 2016-12-03 MED ORDER — LISINOPRIL 20 MG PO TABS: 20.0000 mg | ORAL_TABLET | Freq: Every day | ORAL | 0 refills | Status: DC

## 2016-12-03 MED ORDER — INSULIN NPH ISOPHANE & REGULAR (70-30) 100 UNIT/ML ~~LOC~~ SUSP: SUBCUTANEOUS | 0 refills | Status: DC

## 2016-12-03 NOTE — Patient Instructions (Signed)
Diabetes Mellitus and Food It is important for you to manage your blood sugar (glucose) level. Your blood glucose level can be greatly affected by what you eat. Eating healthier foods in the appropriate amounts throughout the day at about the same time each day will help you control your blood glucose level. It can also help slow or prevent worsening of your diabetes mellitus. Healthy eating may even help you improve the level of your blood pressure and reach or maintain a healthy weight. General recommendations for healthful eating and cooking habits include:  Eating meals and snacks regularly. Avoid going long periods of time without eating to lose weight.  Eating a diet that consists mainly of plant-based foods, such as fruits, vegetables, nuts, legumes, and whole grains.  Using low-heat cooking methods, such as baking, instead of high-heat cooking methods, such as deep frying.  Work with your dietitian to make sure you understand how to use the Nutrition Facts information on food labels. How can food affect me? Carbohydrates Carbohydrates affect your blood glucose level more than any other type of food. Your dietitian will help you determine how many carbohydrates to eat at each meal and teach you how to count carbohydrates. Counting carbohydrates is important to keep your blood glucose at a healthy level, especially if you are using insulin or taking certain medicines for diabetes mellitus. Alcohol Alcohol can cause sudden decreases in blood glucose (hypoglycemia), especially if you use insulin or take certain medicines for diabetes mellitus. Hypoglycemia can be a life-threatening condition. Symptoms of hypoglycemia (sleepiness, dizziness, and disorientation) are similar to symptoms of having too much alcohol. If your health care provider has given you approval to drink alcohol, do so in moderation and use the following guidelines:  Women should not have more than one drink per day, and men  should not have more than two drinks per day. One drink is equal to: ? 12 oz of beer. ? 5 oz of wine. ? 1 oz of hard liquor.  Do not drink on an empty stomach.  Keep yourself hydrated. Have water, diet soda, or unsweetened iced tea.  Regular soda, juice, and other mixers might contain a lot of carbohydrates and should be counted.  What foods are not recommended? As you make food choices, it is important to remember that all foods are not the same. Some foods have fewer nutrients per serving than other foods, even though they might have the same number of calories or carbohydrates. It is difficult to get your body what it needs when you eat foods with fewer nutrients. Examples of foods that you should avoid that are high in calories and carbohydrates but low in nutrients include:  Trans fats (most processed foods list trans fats on the Nutrition Facts label).  Regular soda.  Juice.  Candy.  Sweets, such as cake, pie, doughnuts, and cookies.  Fried foods.  What foods can I eat? Eat nutrient-rich foods, which will nourish your body and keep you healthy. The food you should eat also will depend on several factors, including:  The calories you need.  The medicines you take.  Your weight.  Your blood glucose level.  Your blood pressure level.  Your cholesterol level.  You should eat a variety of foods, including:  Protein. ? Lean cuts of meat. ? Proteins low in saturated fats, such as fish, egg whites, and beans. Avoid processed meats.  Fruits and vegetables. ? Fruits and vegetables that may help control blood glucose levels, such as apples,   mangoes, and yams.  Dairy products. ? Choose fat-free or low-fat dairy products, such as milk, yogurt, and cheese.  Grains, bread, pasta, and rice. ? Choose whole grain products, such as multigrain bread, whole oats, and brown rice. These foods may help control blood pressure.  Fats. ? Foods containing healthful fats, such as  nuts, avocado, olive oil, canola oil, and fish.  Does everyone with diabetes mellitus have the same meal plan? Because every person with diabetes mellitus is different, there is not one meal plan that works for everyone. It is very important that you meet with a dietitian who will help you create a meal plan that is just right for you. This information is not intended to replace advice given to you by your health care provider. Make sure you discuss any questions you have with your health care provider. Document Released: 04/30/2005 Document Revised: 01/09/2016 Document Reviewed: 06/30/2013 Elsevier Interactive Patient Education  2017 Elsevier Inc.  

## 2016-12-03 NOTE — Progress Notes (Signed)
Patient: Evan Townsend Male    DOB: [birth year]   61 y.o.   MRN: [id number] Visit Date: 12/03/2016  Today's Provider: Jacelyn Pi, NP   Chief Complaint  Patient presents with  . Follow-up    follow up regarding stroke 3 weeks ago   Subjective:    HPI   Pt has a CVA in March.  MRI showed 5mm infarct of the R thalamus.  Residual slurred speech, no hemiplegia.  Reviewed discharge summary.    DM: Taking 10 units 70/30 once in the am.  CBGs running 200-213.  A1C was 14.5 in March 2017.  Pt states that he was unable to tolerate Metformin it gave him diarrhea.    HTN:  Taking Lisinopril  for BP.  Denies HA or dizziness.      No Known Allergies Previous Medications   ASPIRIN EC 81 MG EC TABLET    Take 1 tablet (81 mg total) by mouth daily.   ATORVASTATIN (LIPITOR) 40 MG TABLET    Take 1 tablet (40 mg total) by mouth daily.   GABAPENTIN (NEURONTIN) 300 MG CAPSULE    Take 1 capsule (300 mg total) by mouth 3 (three) times daily.   GLUCOSE BLOOD (FREESTYLE INSULINX TEST) TEST STRIP    Use as instructed   INSULIN NPH-REGULAR HUMAN (NOVOLIN 70/30) (70-30) 100 UNIT/ML INJECTION    Inject 0.11ml (16 units total) under the skin 2 (two) times a day with meals   LANCETS (FREESTYLE) LANCETS    Use for glucose checks four times daily   LISINOPRIL (PRINIVIL,ZESTRIL) 5 MG TABLET    Take 1 tablet (5 mg total) by mouth daily.   MELOXICAM (MOBIC) 15 MG TABLET    Take 1 tablet (15 mg total) by mouth daily.   NICOTINE (NICODERM CQ - DOSED IN MG/24 HOURS) 21 MG/24HR PATCH    Place 1 patch (21 mg total) onto the skin daily.    Review of Systems  All other systems reviewed and are negative.   Social History  Substance Use Topics  . Smoking status: Current Every Day Smoker    Packs/day: 1.00    Types: Cigarettes    Start date: 31  . Smokeless tobacco: Never Used  . Alcohol use 0.6 oz/week    1 Cans of beer per week   Objective:   BP (!) 199/52 (BP Location: Left Arm, Patient  Position: Sitting, Cuff Size: Normal)   Pulse (!) 51   Temp 97.9 F (36.6 C)   Ht  (1.651 m)   Wt 154 lb 14.4 oz (70.3 kg)   BMI 25.78 kg/m   Physical Exam  Constitutional: He is oriented to person, place, and time. He appears well-developed and well-nourished.  HENT:  Head: Normocephalic and atraumatic.  Slurred speech  Eyes: Pupils are equal, round, and reactive to light.  Neck: Normal range of motion. Neck supple.  Cardiovascular: Normal rate, regular rhythm and normal heart sounds.   Pulmonary/Chest: Effort normal and breath sounds normal.  Abdominal: Soft. Bowel sounds are normal.  Neurological: He is alert and oriented to person, place, and time.  Skin: Skin is warm and dry.  Vitals reviewed.       Assessment & Plan:        HTN:  Increase Lisinopril to  daily.  FU in 2 weeks for BP check and CMET.  Reviewed s/s of CVA.   HLD: Continue current regimen.  Encouraged cardiac diet and exercise.  Ordered lipid panel as well.  DM:  Take 70/30 10 units BID.  Monitor CBGs daily- fasting.  Add glipizide  daily.  Reviewed s/s of hypoglycemia.  Encouraged healthy diet modifications.      Jacelyn Pi, NP   [facility]

## 2016-12-16 ENCOUNTER — Ambulatory Visit: Payer: Self-pay | Admitting: Pharmacy Technician

## 2016-12-17 ENCOUNTER — Ambulatory Visit: Payer: Self-pay

## 2016-12-17 NOTE — Progress Notes (Signed)
Patient scheduled for eligibility appointment at [facility].  Patient did not show for the appointment on Dec 16, 2017 at 2:00p.m.  Patient did not reschedule eligibility appointment.  [facility] unable to provide additional [facility] assistance until eligibility is determined.  Sherilyn DacostaBetty J. Alyia Townsend Care Manager [facility]

## 2016-12-22 ENCOUNTER — Ambulatory Visit: Payer: Self-pay

## 2017-01-05 ENCOUNTER — Ambulatory Visit: Payer: Self-pay

## 2017-02-02 ENCOUNTER — Ambulatory Visit: Payer: Self-pay | Admitting: Pharmacy Technician

## 2017-02-02 NOTE — Progress Notes (Signed)
Patient scheduled for eligibility appointment at [facility].  Patient did not show for the appointment on February 02, 2017 at 10:30a.m.  Patient did not reschedule eligibility appointment.  [facility] until eligibility is determined.  Sherilyn DacostaBetty J. Elajah Kunsman Care Manager [facility]

## 2017-02-03 ENCOUNTER — Other Ambulatory Visit: Payer: Self-pay

## 2017-02-03 DIAGNOSIS — E119 Type 2 diabetes mellitus without complications: Secondary | ICD-10-CM

## 2017-02-03 MED ORDER — ATORVASTATIN CALCIUM 40 MG PO TABS: 40.0000 mg | ORAL_TABLET | Freq: Every day | ORAL | 0 refills | Status: DC

## 2017-02-03 MED ORDER — INSULIN NPH ISOPHANE & REGULAR (70-30) 100 UNIT/ML ~~LOC~~ SUSP: SUBCUTANEOUS | 0 refills | Status: DC

## 2017-02-03 MED ORDER — GABAPENTIN 300 MG PO CAPS: 300.0000 mg | ORAL_CAPSULE | Freq: Three times a day (TID) | ORAL | 0 refills | Status: DC

## 2017-02-03 MED ORDER — GLIPIZIDE 5 MG PO TABS
5.0000 mg | ORAL_TABLET | Freq: Every day | ORAL | 2 refills | Status: DC
Start: 2017-02-03 — End: 2017-02-12

## 2017-02-03 MED ORDER — ASPIRIN 81 MG PO TBEC: 81.0000 mg | DELAYED_RELEASE_TABLET | Freq: Every day | ORAL | Status: DC

## 2017-02-03 MED ORDER — LISINOPRIL 20 MG PO TABS: 20.0000 mg | ORAL_TABLET | Freq: Every day | ORAL | 0 refills | Status: DC

## 2017-02-11 ENCOUNTER — Ambulatory Visit: Payer: Self-pay | Admitting: Adult Health Nurse Practitioner

## 2017-02-11 VITALS — BP 159/84 | HR 94 | Temp 98.2°F | Wt 149.3 lb

## 2017-02-11 DIAGNOSIS — I10 Essential (primary) hypertension: Secondary | ICD-10-CM

## 2017-02-11 DIAGNOSIS — E785 Hyperlipidemia, unspecified: Secondary | ICD-10-CM | POA: Insufficient documentation

## 2017-02-11 DIAGNOSIS — E119 Type 2 diabetes mellitus without complications: Secondary | ICD-10-CM

## 2017-02-11 DIAGNOSIS — Z794 Long term (current) use of insulin: Secondary | ICD-10-CM

## 2017-02-11 LAB — GLUCOSE, POCT (MANUAL RESULT ENTRY): POC Glucose: 329 mg/dl — AB (ref 70–99)

## 2017-02-11 MED ORDER — LISINOPRIL 40 MG PO TABS: 40.0000 mg | ORAL_TABLET | Freq: Every day | ORAL | 3 refills | Status: DC

## 2017-02-11 NOTE — Progress Notes (Signed)
  Patient: Evan Townsend Male    DOB: 22-Aug-1955   61 y.o.   MRN: [id number] Visit Date: 02/11/2017  Today's Provider: Jacelyn Pieah Doles-Johnson, NP   Chief Complaint  Patient presents with  . Follow-up   Subjective:    HPI   HTN:  Last visit BP was 199/52.  Lisinopril was increased to 20mg  daily.   HLD:  Tolerating current therapy well.   DM:  Taking glipizide 5mg  daily  NPH 10 units BID.  CBG this am was 180- averaging about 200.   No Known Allergies Previous Medications   ASPIRIN 81 MG EC TABLET    Take 1 tablet (81 mg total) by mouth daily.   ATORVASTATIN (LIPITOR) 40 MG TABLET    Take 1 tablet (40 mg total) by mouth daily.   GABAPENTIN (NEURONTIN) 300 MG CAPSULE    Take 1 capsule (300 mg total) by mouth 3 (three) times daily.   GLIPIZIDE (GLUCOTROL) 5 MG TABLET    Take 1 tablet (5 mg total) by mouth daily before breakfast.   GLUCOSE BLOOD (FREESTYLE INSULINX TEST) TEST STRIP    Use as instructed   INSULIN NPH-REGULAR HUMAN (NOVOLIN 70/30) (70-30) 100 UNIT/ML INJECTION    Inject 10 units under the skin 2 (two) times a day with meals   LANCETS (FREESTYLE) LANCETS    Use for glucose checks four times daily   LISINOPRIL (PRINIVIL,ZESTRIL) 20 MG TABLET    Take 1 tablet (20 mg total) by mouth daily.   MELOXICAM (MOBIC) 15 MG TABLET    Take 1 tablet (15 mg total) by mouth daily.   NICOTINE (NICODERM CQ - DOSED IN MG/24 HOURS) 21 MG/24HR PATCH    Place 1 patch (21 mg total) onto the skin daily.    Review of Systems  All other systems reviewed and are negative.   Social History  Substance Use Topics  . Smoking status: Current Every Day Smoker    Packs/day: 1.00    Types: Cigarettes    Start date: 391982  . Smokeless tobacco: Never Used  . Alcohol use 0.6 oz/week    1 Cans of beer per week   Objective:   BP (!) 159/84   Pulse 94   Temp 98.2 F (36.8 C)   Wt 149 lb 4.8 oz (67.7 kg)   BMI 24.84 kg/m   Physical Exam  Constitutional: He is oriented to person, place, and  time. He appears well-developed and well-nourished.  HENT:  Head: Normocephalic and atraumatic.  Neck: Normal range of motion. Neck supple.  Cardiovascular: Normal rate, regular rhythm, normal heart sounds and intact distal pulses.   Pulmonary/Chest: Effort normal and breath sounds normal.  Abdominal: Soft. Bowel sounds are normal.  Musculoskeletal: He exhibits no edema.  Neurological: He is alert and oriented to person, place, and time.  Skin: Skin is warm and dry.  Vitals reviewed.       Assessment & Plan:         HTN:   Not controlled.  Goal BP <140/80.  Increase lisinopril to 40mg  daily.  Encourage low salt diet and exercise.   HLD:   Continue current regimen.  Encourage low cholesterol, low fat diet and exercise.   DM:  Encourage diabetic diet and exercise.  Continue current medication regimen.   Routine labs ordered.  FU in 2 weeks for BP check and to review labs.       Jacelyn Pieah Doles-Johnson, NP   [facility] [place]

## 2017-02-12 ENCOUNTER — Other Ambulatory Visit: Payer: Self-pay

## 2017-02-12 DIAGNOSIS — E119 Type 2 diabetes mellitus without complications: Secondary | ICD-10-CM

## 2017-02-12 LAB — COMPREHENSIVE METABOLIC PANEL
ALT: 28 IU/L (ref 0–44)
AST: 19 IU/L (ref 0–40)
Albumin/Globulin Ratio: 1.9 (ref 1.2–2.2)
Albumin: 4.7 g/dL (ref 3.5–5.5)
Alkaline Phosphatase: 136 IU/L — ABNORMAL HIGH (ref 39–117)
BUN/Creatinine Ratio: 14 (ref 9–20)
BUN: 18 mg/dL (ref 6–24)
Bilirubin Total: <0.2 mg/dL (ref 0.0–1.2)
CO2: 21 mmol/L (ref 20–29)
Calcium: 9.7 mg/dL (ref 8.7–10.2)
Chloride: 101 mmol/L (ref 96–106)
Creatinine, Ser: 1.29 mg/dL — ABNORMAL HIGH (ref 0.76–1.27)
GFR calc Af Amer: 70 mL/min/{1.73_m2} (ref >59)
GFR calc non Af Amer: 60 mL/min/{1.73_m2} (ref >59)
Globulin, Total: 2.5 g/dL (ref 1.5–4.5)
Glucose: 316 mg/dL — ABNORMAL HIGH (ref 65–99)
Potassium: 4.1 mmol/L (ref 3.5–5.2)
Sodium: 140 mmol/L (ref 134–144)
Total Protein: 7.2 g/dL (ref 6.0–8.5)

## 2017-02-12 LAB — LIPID PANEL
Chol/HDL Ratio: 3.4 ratio (ref 0.0–5.0)
Cholesterol, Total: 238 mg/dL — ABNORMAL HIGH (ref 100–199)
HDL: 69 mg/dL (ref >39)
Triglycerides: 416 mg/dL — ABNORMAL HIGH (ref 0–149)

## 2017-02-12 LAB — HEMOGLOBIN A1C
Est. average glucose Bld gHb Est-mCnc: 283 mg/dL
Hgb A1c MFr Bld: 11.5 % — ABNORMAL HIGH (ref 4.8–5.6)

## 2017-02-12 MED ORDER — LISINOPRIL 40 MG PO TABS: 40.0000 mg | ORAL_TABLET | Freq: Every day | ORAL | 3 refills | Status: DC

## 2017-02-12 MED ORDER — GABAPENTIN 300 MG PO CAPS: 300.0000 mg | ORAL_CAPSULE | Freq: Three times a day (TID) | ORAL | 0 refills | Status: DC

## 2017-02-12 MED ORDER — INSULIN NPH ISOPHANE & REGULAR (70-30) 100 UNIT/ML ~~LOC~~ SUSP: SUBCUTANEOUS | 0 refills | Status: DC

## 2017-02-12 MED ORDER — GLIPIZIDE 5 MG PO TABS
5.0000 mg | ORAL_TABLET | Freq: Every day | ORAL | 2 refills | Status: DC
Start: 2017-02-12 — End: 2017-07-19

## 2017-02-12 MED ORDER — ATORVASTATIN CALCIUM 40 MG PO TABS: 40.0000 mg | ORAL_TABLET | Freq: Every day | ORAL | 0 refills | Status: DC

## 2017-02-19 ENCOUNTER — Telehealth: Payer: Self-pay | Admitting: Pharmacy Technician

## 2017-02-19 NOTE — Telephone Encounter (Signed)
Patient eligible to receive medication assistance at [facility] until 09/17/17 as long as eligibility requirements continue to be met.  Tegan Burnside J. Passion Lavin Care Manager [facility] 

## 2017-02-25 ENCOUNTER — Telehealth: Payer: Self-pay

## 2017-02-25 ENCOUNTER — Ambulatory Visit: Payer: Self-pay

## 2017-02-25 NOTE — Telephone Encounter (Signed)
Received PAP application from [facility] for Novolin 70/30 placed for provider to sign. 

## 2017-03-02 NOTE — Telephone Encounter (Signed)
Placed signed application/script in [facility] folder for pickup. 

## 2017-03-11 ENCOUNTER — Ambulatory Visit: Payer: Self-pay | Admitting: Family Medicine

## 2017-04-08 ENCOUNTER — Ambulatory Visit: Payer: Self-pay

## 2017-04-20 ENCOUNTER — Telehealth: Payer: Self-pay | Admitting: Pharmacist

## 2017-04-20 NOTE — Telephone Encounter (Signed)
04/20/17 Mailing denial letter to patient for Novolin 70/30, we mailed form to patient 02/22/17 to sign and return, we have not received back. I am also notifying [facility] of this.Forde RadonAJ

## 2017-07-19 ENCOUNTER — Other Ambulatory Visit: Payer: Self-pay

## 2017-07-19 DIAGNOSIS — E119 Type 2 diabetes mellitus without complications: Secondary | ICD-10-CM

## 2017-07-19 MED ORDER — GLIPIZIDE 5 MG PO TABS: 5.0000 mg | ORAL_TABLET | Freq: Every day | ORAL | 2 refills | Status: DC

## 2017-07-19 MED ORDER — LISINOPRIL 40 MG PO TABS: 40.0000 mg | ORAL_TABLET | Freq: Every day | ORAL | 3 refills | Status: DC

## 2017-07-19 MED ORDER — ATORVASTATIN CALCIUM 40 MG PO TABS: 40.0000 mg | ORAL_TABLET | Freq: Every day | ORAL | 0 refills | Status: DC

## 2017-07-22 ENCOUNTER — Ambulatory Visit: Payer: Self-pay

## 2017-08-26 ENCOUNTER — Ambulatory Visit: Payer: Self-pay

## 2017-09-10 ENCOUNTER — Inpatient Hospital Stay: Payer: Self-pay

## 2017-09-10 ENCOUNTER — Emergency Department: Payer: Self-pay

## 2017-09-10 ENCOUNTER — Encounter: Payer: Self-pay | Admitting: Emergency Medicine

## 2017-09-10 ENCOUNTER — Inpatient Hospital Stay
Admission: EM | Admit: 2017-09-10 | Discharge: 2017-09-11 | DRG: 066 | Disposition: A | Discharge status: Home / Self Care | Payer: Self-pay | Attending: Internal Medicine | Admitting: Internal Medicine

## 2017-09-10 ENCOUNTER — Inpatient Hospital Stay: Admit: 2017-09-10 | Payer: Self-pay

## 2017-09-10 ENCOUNTER — Other Ambulatory Visit: Payer: Self-pay

## 2017-09-10 DIAGNOSIS — Z8673 Personal history of transient ischemic attack (TIA), and cerebral infarction without residual deficits: Secondary | ICD-10-CM

## 2017-09-10 DIAGNOSIS — Z794 Long term (current) use of insulin: Secondary | ICD-10-CM

## 2017-09-10 DIAGNOSIS — E119 Type 2 diabetes mellitus without complications: Secondary | ICD-10-CM

## 2017-09-10 DIAGNOSIS — Z8249 Family history of ischemic heart disease and other diseases of the circulatory system: Secondary | ICD-10-CM

## 2017-09-10 DIAGNOSIS — I10 Essential (primary) hypertension: Secondary | ICD-10-CM | POA: Diagnosis present

## 2017-09-10 DIAGNOSIS — Z9114 Patient's other noncompliance with medication regimen: Secondary | ICD-10-CM

## 2017-09-10 DIAGNOSIS — R29703 NIHSS score 3: Secondary | ICD-10-CM | POA: Diagnosis present

## 2017-09-10 DIAGNOSIS — Z7982 Long term (current) use of aspirin: Secondary | ICD-10-CM

## 2017-09-10 DIAGNOSIS — I639 Cerebral infarction, unspecified: Principal | ICD-10-CM | POA: Diagnosis present

## 2017-09-10 DIAGNOSIS — E785 Hyperlipidemia, unspecified: Secondary | ICD-10-CM | POA: Diagnosis present

## 2017-09-10 DIAGNOSIS — R202 Paresthesia of skin: Secondary | ICD-10-CM | POA: Diagnosis present

## 2017-09-10 DIAGNOSIS — E1165 Type 2 diabetes mellitus with hyperglycemia: Secondary | ICD-10-CM | POA: Diagnosis present

## 2017-09-10 DIAGNOSIS — F17210 Nicotine dependence, cigarettes, uncomplicated: Secondary | ICD-10-CM | POA: Diagnosis present

## 2017-09-10 DIAGNOSIS — Z833 Family history of diabetes mellitus: Secondary | ICD-10-CM

## 2017-09-10 DIAGNOSIS — R4781 Slurred speech: Secondary | ICD-10-CM | POA: Diagnosis present

## 2017-09-10 DIAGNOSIS — Z791 Long term (current) use of non-steroidal anti-inflammatories (NSAID): Secondary | ICD-10-CM

## 2017-09-10 DIAGNOSIS — R9431 Abnormal electrocardiogram [ECG] [EKG]: Secondary | ICD-10-CM

## 2017-09-10 DIAGNOSIS — G8321 Monoplegia of upper limb affecting right dominant side: Secondary | ICD-10-CM | POA: Diagnosis present

## 2017-09-10 LAB — CBC
HCT: 47.4 % (ref 40.0–52.0)
Hemoglobin: 16 g/dL (ref 13.0–18.0)
MCH: 30.5 pg (ref 26.0–34.0)
MCHC: 33.7 g/dL (ref 32.0–36.0)
MCV: 90.4 fL (ref 80.0–100.0)
Platelets: 247 10*3/uL (ref 150–440)
RBC: 5.24 MIL/uL (ref 4.40–5.90)
RDW: 14.6 % — ABNORMAL HIGH (ref 11.5–14.5)
WBC: 6.5 10*3/uL (ref 3.8–10.6)

## 2017-09-10 LAB — BASIC METABOLIC PANEL
Anion gap: 9 (ref 5–15)
BUN: 16 mg/dL (ref 6–20)
CO2: 23 mmol/L (ref 22–32)
Calcium: 10.1 mg/dL (ref 8.9–10.3)
Chloride: 101 mmol/L (ref 101–111)
Creatinine, Ser: 1.23 mg/dL (ref 0.61–1.24)
GFR calc Af Amer: >60 mL/min (ref >60)
GFR calc non Af Amer: >60 mL/min (ref >60)
Glucose, Bld: 331 mg/dL — ABNORMAL HIGH (ref 65–99)
Potassium: 4.2 mmol/L (ref 3.5–5.1)
Sodium: 133 mmol/L — ABNORMAL LOW (ref 135–145)

## 2017-09-10 LAB — GLUCOSE, CAPILLARY
Glucose-Capillary: 348 mg/dL — ABNORMAL HIGH (ref 65–99)
Glucose-Capillary: 282 mg/dL — ABNORMAL HIGH (ref 65–99)
Glucose-Capillary: 377 mg/dL — ABNORMAL HIGH (ref 65–99)
Glucose-Capillary: 176 mg/dL — ABNORMAL HIGH (ref 65–99)

## 2017-09-10 LAB — TROPONIN I: Troponin I: <0.03 ng/mL (ref <0.03)

## 2017-09-10 MED ORDER — ATORVASTATIN CALCIUM 20 MG PO TABS
40.0000 mg | ORAL_TABLET | Freq: Every day | ORAL | Status: DC
  Administered 2017-09-10 – 2017-09-11 (×2): 40 mg via ORAL
  Filled 2017-09-10 (×2): qty 2

## 2017-09-10 MED ORDER — INSULIN ASPART PROT & ASPART (70-30 MIX) 100 UNIT/ML ~~LOC~~ SUSP: 10.0000 [IU] | Freq: Two times a day (BID) | SUBCUTANEOUS | Status: DC

## 2017-09-10 MED ORDER — STROKE: EARLY STAGES OF RECOVERY BOOK
Freq: Once | Status: AC
  Administered 2017-09-10: 16:00:00

## 2017-09-10 MED ORDER — INFLUENZA VAC SPLIT QUAD 0.5 ML IM SUSY
0.5000 mL | PREFILLED_SYRINGE | INTRAMUSCULAR | Status: DC
  Filled 2017-09-10: qty 0.5

## 2017-09-10 MED ORDER — ASPIRIN EC 81 MG PO TBEC
81.0000 mg | DELAYED_RELEASE_TABLET | Freq: Every day | ORAL | Status: DC
  Administered 2017-09-10 – 2017-09-11 (×2): 81 mg via ORAL
  Filled 2017-09-10 (×2): qty 1

## 2017-09-10 MED ORDER — GABAPENTIN 300 MG PO CAPS
300.0000 mg | ORAL_CAPSULE | Freq: Three times a day (TID) | ORAL | Status: DC
  Administered 2017-09-10 – 2017-09-11 (×3): 300 mg via ORAL
  Filled 2017-09-10 (×3): qty 1

## 2017-09-10 MED ORDER — LOPERAMIDE HCL 2 MG PO CAPS
2.0000 mg | ORAL_CAPSULE | Freq: Four times a day (QID) | ORAL | Status: DC | PRN
Start: 2017-09-10 — End: 2017-09-11
  Administered 2017-09-10: 2 mg via ORAL
  Filled 2017-09-10: qty 1

## 2017-09-10 MED ORDER — ENOXAPARIN SODIUM 40 MG/0.4ML ~~LOC~~ SOLN
40.0000 mg | SUBCUTANEOUS | Status: DC
  Administered 2017-09-10: 23:00:00 40 mg via SUBCUTANEOUS
  Filled 2017-09-10: qty 0.4

## 2017-09-10 MED ORDER — ACETAMINOPHEN 325 MG PO TABS: 650.0000 mg | ORAL_TABLET | ORAL | Status: DC | PRN

## 2017-09-10 MED ORDER — INSULIN ASPART 100 UNIT/ML ~~LOC~~ SOLN
2.0000 [IU] | Freq: Two times a day (BID) | SUBCUTANEOUS | Status: DC
  Administered 2017-09-10 – 2017-09-11 (×3): 2 [IU] via SUBCUTANEOUS
  Filled 2017-09-10 (×3): qty 1

## 2017-09-10 MED ORDER — HYDRALAZINE HCL 20 MG/ML IJ SOLN: 10.0000 mg | Freq: Four times a day (QID) | INTRAMUSCULAR | Status: DC | PRN

## 2017-09-10 MED ORDER — ACETAMINOPHEN 650 MG RE SUPP: 650.0000 mg | RECTAL | Status: DC | PRN

## 2017-09-10 MED ORDER — ACETAMINOPHEN 160 MG/5ML PO SOLN
650.0000 mg | ORAL | Status: DC | PRN
Start: 2017-09-10 — End: 2017-09-11
  Filled 2017-09-10: qty 20.3

## 2017-09-10 MED ORDER — NICOTINE 21 MG/24HR TD PT24
21.0000 mg | MEDICATED_PATCH | Freq: Every day | TRANSDERMAL | Status: DC
  Administered 2017-09-10 – 2017-09-11 (×2): 21 mg via TRANSDERMAL
  Filled 2017-09-10 (×2): qty 1

## 2017-09-10 MED ORDER — CLOPIDOGREL BISULFATE 75 MG PO TABS
75.0000 mg | ORAL_TABLET | Freq: Every day | ORAL | Status: DC
  Administered 2017-09-10 – 2017-09-11 (×2): 75 mg via ORAL
  Filled 2017-09-10 (×2): qty 1

## 2017-09-10 MED ORDER — GLUCERNA SHAKE PO LIQD
237.0000 mL | Freq: Three times a day (TID) | ORAL | Status: DC
  Administered 2017-09-10: 237 mL via ORAL

## 2017-09-10 MED ORDER — SENNOSIDES-DOCUSATE SODIUM 8.6-50 MG PO TABS: 1.0000 | ORAL_TABLET | Freq: Every evening | ORAL | Status: DC | PRN

## 2017-09-10 MED ORDER — INSULIN ASPART 100 UNIT/ML ~~LOC~~ SOLN
0.0000 [IU] | Freq: Three times a day (TID) | SUBCUTANEOUS | Status: DC
  Administered 2017-09-10: 9 [IU] via SUBCUTANEOUS
  Administered 2017-09-11: 5 [IU] via SUBCUTANEOUS
  Administered 2017-09-11: 2 [IU] via SUBCUTANEOUS
  Filled 2017-09-10 (×3): qty 1

## 2017-09-10 MED ORDER — INSULIN ASPART 100 UNIT/ML IV SOLN: 2.0000 [IU] | Freq: Three times a day (TID) | INTRAVENOUS | Status: DC

## 2017-09-10 MED ORDER — INSULIN ASPART 100 UNIT/ML ~~LOC~~ SOLN: 2.0000 [IU] | Freq: Three times a day (TID) | SUBCUTANEOUS | Status: DC

## 2017-09-10 MED ORDER — INSULIN DETEMIR 100 UNIT/ML ~~LOC~~ SOLN
6.0000 [IU] | Freq: Two times a day (BID) | SUBCUTANEOUS | Status: DC
  Administered 2017-09-10 – 2017-09-11 (×3): 6 [IU] via SUBCUTANEOUS
  Filled 2017-09-10 (×5): qty 0.06

## 2017-09-10 MED ORDER — INSULIN ASPART PROT & ASPART (70-30 MIX) 100 UNIT/ML ~~LOC~~ SUSP: 6.0000 [IU] | Freq: Two times a day (BID) | SUBCUTANEOUS | Status: DC

## 2017-09-10 NOTE — H&P (Signed)
[facility] [place] [facility]   PATIENT NAME: Evan Townsend    MR#:  [id number]  DATE OF BIRTH:  1955/09/16  DATE OF ADMISSION:  09/10/2017  PRIMARY CARE PHYSICIAN: Doles-Johnson, Teah, NP   REQUESTING/REFERRING PHYSICIAN: dr Fanny Bien  CHIEF COMPLAINT:   Slurred speech, right arm numbness and high blood sugar HISTORY OF PRESENT ILLNESS:  Evan Townsend  is a 62 y.o. male with a known history of diabetes, hyperlipidemia, essential hypertension and CVA without residual deficits who presents today due to above symptoms. Patient reports that this morning he had slurred speech along with some numbness around his perioral area. He has also complaining of right arm numbness over the past 2 days which has been on and off. His blood sugar was almost 500 this morning. He is compliant with his medications. He notices no other focal neurological deficits. Wife does not notice any other focal neurological deficits as well. He continues to have slurred speech.  In the emergency room CTA head shows no intracranial hemorrhage or CVA. Neurology has been contacted by the ED physician he was recommending further workup for CVA.  PAST MEDICAL HISTORY:   Past Medical History:  Diagnosis Date  . Diabetes 1.5, managed as type 2 (HCC)   . Diabetes mellitus without complication (HCC)   . Hypercholesterolemia   . Hypertension   . Pain in both feet   . Stroke [facility])     PAST SURGICAL HISTORY:  History reviewed. No pertinent surgical history.  SOCIAL HISTORY:   Social History   Tobacco Use  . Smoking status: Current Every Day Smoker    Packs/day: 1.00    Types: Cigarettes    Start date: 16  . Smokeless tobacco: Never Used  Substance Use Topics  . Alcohol use: Yes    Alcohol/week: 0.6 oz    Types: 1 Cans of beer per week    FAMILY HISTORY:   Family History  Problem Relation Age of Onset  . Hypertension Mother   . Hypertension Father   . Diabetes Father     DRUG  ALLERGIES:  No Known Allergies  REVIEW OF SYSTEMS:   Review of Systems  Constitutional: Negative.  Negative for chills, fever and malaise/fatigue.  HENT: Negative.  Negative for ear discharge, ear pain, hearing loss, nosebleeds and sore throat.   Eyes: Negative.  Negative for blurred vision and pain.  Respiratory: Negative.  Negative for cough, hemoptysis, shortness of breath and wheezing.   Cardiovascular: Negative.  Negative for chest pain, palpitations and leg swelling.  Gastrointestinal: Negative.  Negative for abdominal pain, blood in stool, diarrhea, nausea and vomiting.  Genitourinary: Negative.  Negative for dysuria.  Musculoskeletal: Negative.  Negative for back pain.  Skin: Negative.   Neurological: Positive for sensory change and speech change. Negative for dizziness, tremors, focal weakness, seizures and headaches.  Endo/Heme/Allergies: Negative.  Does not bruise/bleed easily.  Psychiatric/Behavioral: Negative.  Negative for depression, hallucinations and suicidal ideas.    MEDICATIONS AT HOME:   Prior to Admission medications   Medication Sig Start Date End Date Taking? Authorizing Provider  aspirin 81 MG EC tablet Take 1 tablet (81 mg total) by mouth daily. 02/03/17   Virl Axe, MD  atorvastatin (LIPITOR) 40 MG tablet Take 1 tablet (40 mg total) by mouth daily. 07/19/17 07/19/18  Doles-Johnson, Teah, NP  gabapentin (NEURONTIN) 300 MG capsule Take 1 capsule (300 mg total) by mouth 3 (three) times daily. 02/12/17 02/12/18  Doles-Johnson, Rulon Sera, NP  glipiZIDE (GLUCOTROL)  5 MG tablet Take 1 tablet (5 mg total) by mouth daily before breakfast. 07/19/17   Doles-Johnson, Teah, NP  glucose blood (FREESTYLE INSULINX TEST) test strip Use as instructed 03/08/16   Schaevitz, Myra Rude, MD  insulin NPH-regular Human (NOVOLIN 70/30) (70-30) 100 UNIT/ML injection Inject 10 units under the skin 2 (two) times a day with meals 02/12/17   Doles-Johnson, Teah, NP  Lancets (FREESTYLE) lancets  Use for glucose checks four times daily 03/08/16   Myrna Blazer, MD  lisinopril (PRINIVIL,ZESTRIL) 40 MG tablet Take 1 tablet (40 mg total) by mouth daily. 07/19/17 07/19/18  Doles-Johnson, Teah, NP  meloxicam (MOBIC) 15 MG tablet Take 1 tablet (15 mg total) by mouth daily. 11/11/16   Auburn Bilberry, MD      VITAL SIGNS:  Blood pressure (!) 135/95, pulse (!) 58, temperature 97.9 F (36.6 C), resp. rate 12, height 5\' 6"  (1.676 m), weight 72.6 kg (160 lb), SpO2 100 %.  PHYSICAL EXAMINATION:   Physical Exam  Constitutional: He is oriented to person, place, and time and well-developed, well-nourished, and in no distress. No distress.  HENT:  Head: Normocephalic.  Eyes: No scleral icterus.  Neck: Normal range of motion. Neck supple. No JVD present. No tracheal deviation present.  Cardiovascular: Normal rate, regular rhythm and normal heart sounds. Exam reveals no gallop and no friction rub.  No murmur heard. Pulmonary/Chest: Effort normal and breath sounds normal. No respiratory distress. He has no wheezes. He has no rales. He exhibits no tenderness.  Abdominal: Soft. Bowel sounds are normal. He exhibits no distension and no mass. There is no tenderness. There is no rebound and no guarding.  Musculoskeletal: Normal range of motion. He exhibits no edema.  Neurological: He is alert and oriented to person, place, and time. He displays abnormal reflex. No cranial nerve deficit. He exhibits normal muscle tone. Coordination normal.  Mild slurred speech no other focal deficits noted  Skin: Skin is warm. No rash noted. No erythema.  Psychiatric: Affect and judgment normal.      LABORATORY PANEL:   CBC Recent Labs  Lab 09/10/17 0938  WBC 6.5  HGB 16.0  HCT 47.4  PLT 247   ------------------------------------------------------------------------------------------------------------------  Chemistries  Recent Labs  Lab 09/10/17 0938  NA 133*  K 4.2  CL 101  CO2 23  GLUCOSE  331*  BUN 16  CREATININE 1.23  CALCIUM 10.1   ------------------------------------------------------------------------------------------------------------------  Cardiac Enzymes Recent Labs  Lab 09/10/17 0938  TROPONINI <0.03   ------------------------------------------------------------------------------------------------------------------  RADIOLOGY:  Ct Head Wo Contrast  Result Date: 09/10/2017 CLINICAL DATA:  Slurred speech and right upper extremity numbness EXAM: CT HEAD WITHOUT CONTRAST TECHNIQUE: Contiguous axial images were obtained from the base of the skull through the vertex without intravenous contrast. COMPARISON:  Head CT November 10, 2016 and brain MRI November 10, 2016 FINDINGS: Brain: The ventricles are normal in size and configuration. There is no intracranial mass, hemorrhage, extra-axial fluid collection, or midline shift. There is slight small vessel disease in the centra semiovale bilaterally. Elsewhere gray-white compartments appear normal. No acute infarct demonstrable. Vascular: No hyperdense vessel.  No vascular calcification evident. Skull: Bony calvarium appears intact. Sinuses/Orbits: Visualized paranasal sinuses are clear. Visualized orbits appear symmetric bilaterally. Other: Mastoid air cells are clear. IMPRESSION: Slight periventricular small vessel disease. No acute infarct evident. No mass or hemorrhage. Study otherwise unremarkable. Electronically Signed   By: Bretta Bang III M.D.   On: 09/10/2017 10:42    EKG:  Normal sinus rhythm with  right bundle branch block   IMPRESSION AND PLAN:   62 year old male with history of diabetes, essential hypertension and CVA without residual deficits who presents with slurred speech and right arm numbness.  1. Acute CVA with slurred speech and right arm numbness  Patient will be admitted with telemetry. We will obtain MRI/MRA of the brain,2D echocardiogram,and carotid Dopplers.  I have ordered neuro checks. We will  continue aspirin and statin with addition of Plavix. Physical therapy, occupational therapy and speech pathology along with neurology consultation has been requested I will hold hypertensive medications to allow brain perfusion.  2. Diabetes: Continue outpatient regimen with ADA diet/sliding scale Check hemoglobin A1c Diabetes nurse consultation requested  3. Essential hypertension: Due to acute CVA I will hold hypertensive medications to allow brain perfusion When necessary hydralazine ordered  4. Hyperlipidemia: Continue statin and check lipid panel  5.Tobacco dependence: Patient is encouraged to quit smoking. Counseling was provided for 4 minutes.  Nicotine patch ordered    All the records are reviewed and case discussed with ED provider. Management plans discussed with the patient and he is in agreement  CODE STATUS: FULL  TOTAL TIME TAKING CARE OF THIS PATIENT: 41 minutes.    Saaya Procell M.D on 09/10/2017 at 10:57 AM  Between 7am to 6pm - Pager - [phone]  After 6pm go to [email or web address] - password [email or web address]  [facility]  Office  [phone]  CC: Primary care physician; Patient, No Pcp Per

## 2017-09-10 NOTE — Progress Notes (Signed)
PT Cancellation Note  Patient Details Name: Evan AnoDuncan E Hasten Sr. MRN: [id number] DOB: [birth year]   Cancelled Treatment:    Reason Eval/Treat Not Completed: Patient at procedure or test/unavailable.  PT consult received.  Chart reviewed.  Nursing reports pt still off floor for testing and plan for ECHO after that.  Will re-attempt PT evaluation at a later date/time.  Hendricks LimesEmily Vonita Calloway, PT 09/10/17, 3:01 PM [phone]

## 2017-09-10 NOTE — Progress Notes (Addendum)
Inpatient Diabetes Program Recommendations  AACE/ADA: New Consensus Statement on Inpatient Glycemic Control (2015)  Target Ranges:  Prepandial:   less than 140 mg/dL      Peak postprandial:   less than 180 mg/dL (1-2 hours)      Critically ill patients:  140 - 180 mg/dL   Lab Results  Component Value Date   GLUCAP 282 (H) 09/10/2017   HGBA1C 11.5 (H) 02/11/2017    Review of Glycemic Control  Results for Evan Townsend, Evan Townsend. (MRN [id number]) as of 09/10/2017 12:37  Ref. Range 09/10/2017 09:28 09/10/2017 10:49  Glucose-Capillary Latest Ref Range: 65 - 99 mg/dL 409348 (H) 811282 (H)    Diabetes history: Type 2- A1C pending Outpatient Diabetes medications: NPH 70/30 10 units bid, Glipizide 5mg  qam Current orders for Inpatient glycemic control: Novolog 70/30 10 units bid, Novolog 0-9 units tid  Inpatient Diabetes Program Recommendations: Consider changing Novolog 70/30 to Levemir 6 units bid and add Novolog 2 units bid and add Novolog 0-5 units qhs.  Continue Novolog 0-9 units tid.  Text page to Dr. Juliene PinaMody @12 :42pm.   Re-texted Dr. Juliene PinaMody @ 2:40pm- correction to above request; consider Novolog 2 units tid with meals and Levemir 6 units bid.   Susette RacerJulie Freyja Govea, RN, BA, MHA, CDE Diabetes Coordinator Inpatient Diabetes Program  [phone] (Team Pager) [phone] [facility] Office) 09/10/2017 12:40 PM

## 2017-09-10 NOTE — ED Triage Notes (Signed)
Pt c/o right arm numbness for a few days, wife states the patient woke up with slurred speech today. Pt states he went to bed around 0100 this morning.    Pt states blood sugar this morning was over 400.  Took 10 units of insulin - does not know the name of medication.

## 2017-09-10 NOTE — Consult Note (Addendum)
Referring Physician: Mody    Chief Complaint: Right sided numbness and slurred speech  HPI: Evan NUNN Sr. is an 62 y.o. male with a history of stroke and residual left sided complaints who reports that on yesterday evening he began to notice numbness around his mouth.  Then noted right upper extremity tingling.  His speech became slurred as well.  With no improvement in symptoms patient presented for evaluation.  Initial NIHSS of 3.  Date last known well: Date: 09/09/2017 Time last known well: Time: 18:00 tPA Given: No: Outside time window  Past Medical History:  Diagnosis Date  . Diabetes 1.5, managed as type 2 (HCC)   . Diabetes mellitus without complication (HCC)   . Hypercholesterolemia   . Hypertension   . Pain in both feet   . Stroke [facility]     History reviewed. No pertinent surgical history.  Family History  Problem Relation Age of Onset  . Hypertension Mother   . Hypertension Father   . Diabetes Father    Social History:  reports that he has been smoking cigarettes.  He started smoking about 37 years ago. He has been smoking about 1.00 pack per day. he has never used smokeless tobacco. He reports that he drinks about 0.6 oz of alcohol per week. He reports that he does not use drugs.  Allergies: No Known Allergies  Medications:  I have reviewed the patient's current medications. Prior to Admission:  Medications Prior to Admission  Medication Sig Dispense Refill Last Dose  . aspirin 81 MG EC tablet Take 1 tablet (81 mg total) by mouth daily. 30 tablet  09/09/2017 at 0800  . atorvastatin (LIPITOR) 40 MG tablet Take 1 tablet (40 mg total) by mouth daily. 30 tablet 0 Past Week at 0800  . gabapentin (NEURONTIN) 300 MG capsule Take 1 capsule (300 mg total) by mouth 3 (three) times daily. 90 capsule 0 Past Week at 1800  . glipiZIDE (GLUCOTROL) 5 MG tablet Take 1 tablet (5 mg total) by mouth daily before breakfast. 30 tablet 2 Past Week at Unknown time  . insulin  NPH-regular Human (NOVOLIN 70/30) (70-30) 100 UNIT/ML injection Inject 10 units under the skin 2 (two) times a day with meals 10 mL 0 09/10/2017 at 0800  . lisinopril (PRINIVIL,ZESTRIL) 40 MG tablet Take 1 tablet (40 mg total) by mouth daily. 30 tablet 3 Past Week at 0800  . meloxicam (MOBIC) 15 MG tablet Take 1 tablet (15 mg total) by mouth daily. 30 tablet 0 Past Week at 0800  . glucose blood (FREESTYLE INSULINX TEST) test strip Use as instructed 100 each 1 Taking  . Lancets (FREESTYLE) lancets Use for glucose checks four times daily 100 each 1 Taking   Scheduled: . aspirin EC  81 mg Oral Daily  . atorvastatin  40 mg Oral Daily  . clopidogrel  75 mg Oral Daily  . enoxaparin (LOVENOX) injection  40 mg Subcutaneous Q24H  . feeding supplement (GLUCERNA SHAKE)  237 mL Oral TID BM  . gabapentin  300 mg Oral TID  . [START ON 09/11/2017] Influenza vac split quadrivalent PF  0.5 mL Intramuscular Tomorrow-1000  . insulin aspart  0-9 Units Subcutaneous TID WC  . insulin aspart  2 Units Subcutaneous BID  . insulin detemir  6 Units Subcutaneous BID  . nicotine  21 mg Transdermal Daily    ROS: History obtained from the patient  General ROS: negative for - chills, fatigue, fever, night sweats, weight gain or weight loss Psychological  ROS: negative for - behavioral disorder, hallucinations, memory difficulties, mood swings or suicidal ideation Ophthalmic ROS: negative for - blurry vision, double vision, eye pain or loss of vision ENT ROS: negative for - epistaxis, nasal discharge, oral lesions, sore throat, tinnitus or vertigo Allergy and Immunology ROS: negative for - hives or itchy/watery eyes Hematological and Lymphatic ROS: negative for - bleeding problems, bruising or swollen lymph nodes Endocrine ROS: negative for - galactorrhea, hair pattern changes, polydipsia/polyuria or temperature intolerance Respiratory ROS: negative for - cough, hemoptysis, shortness of breath or wheezing Cardiovascular  ROS: negative for - chest pain, dyspnea on exertion, edema or irregular heartbeat Gastrointestinal ROS: negative for - abdominal pain, diarrhea, hematemesis, nausea/vomiting or stool incontinence Genito-Urinary ROS: negative for - dysuria, hematuria, incontinence or urinary frequency/urgency Musculoskeletal ROS: negative for - joint swelling or muscular weakness Neurological ROS: as noted in HPI Dermatological ROS: negative for rash and skin lesion changes  Physical Examination: Blood pressure (!) 141/67, pulse (!) 51, temperature 97.6 F (36.4 C), temperature source Oral, resp. rate 16, height 5\' 6"  (1.676 m), weight 72.6 kg (160 lb), SpO2 100 %.  HEENT-  Normocephalic, no lesions, without obvious abnormality.  Normal external eye and conjunctiva.  Normal TM's bilaterally.  Normal auditory canals and external ears. Normal external nose, mucus membranes and septum.  Normal pharynx. Cardiovascular- S1, S2 normal, pulses palpable throughout   Lungs- chest clear, no wheezing, rales, normal symmetric air entry Abdomen- soft, non-tender; bowel sounds normal; no masses,  no organomegaly Extremities- no edema Lymph-no adenopathy palpable Musculoskeletal-no joint tenderness, deformity or swelling Skin-warm and dry, no hyperpigmentation, vitiligo, or suspicious lesions  Neurological Examination   Mental Status: Alert, oriented, thought content appropriate.  Speech fluent without evidence of aphasia.  Able to follow 3 step commands without difficulty. Cranial Nerves: II: Discs flat bilaterally; Visual fields grossly normal, pupils equal, round, reactive to light and accommodation III,IV, VI: ptosis not present, extra-ocular motions intact bilaterally V,VII: smile symmetric, facial light touch sensation normal bilaterally VIII: hearing normal bilaterally IX,X: gag reflex present XI: bilateral shoulder shrug XII: midline tongue extension Motor: Right : Upper extremity   5/5    Left:     Upper  extremity   5-/5  Lower extremity   5/5     Lower extremity   5-/5 Tone and bulk:normal tone throughout; no atrophy noted Sensory: Pinprick and light touch decreased in the left hand Deep Tendon Reflexes: 2+ and symmetric throughout Plantars: Right: upgoing   Left: upgoing Cerebellar: Normal finger-to-nose and normal heel-to-shin testing bilaterally Gait: not tested due to safety concerns    Laboratory Studies:  Basic Metabolic Panel: Recent Labs  Lab 09/10/17 0938  NA 133*  K 4.2  CL 101  CO2 23  GLUCOSE 331*  BUN 16  CREATININE 1.23  CALCIUM 10.1    Liver Function Tests: No results for input(s): AST, ALT, ALKPHOS, BILITOT, PROT, ALBUMIN in the last 168 hours. No results for input(s): LIPASE, AMYLASE in the last 168 hours. No results for input(s): AMMONIA in the last 168 hours.  CBC: Recent Labs  Lab 09/10/17 0938  WBC 6.5  HGB 16.0  HCT 47.4  MCV 90.4  PLT 247    Cardiac Enzymes: Recent Labs  Lab 09/10/17 0938  TROPONINI <0.03    BNP: Invalid input(s): POCBNP  CBG: Recent Labs  Lab 09/10/17 0928 09/10/17 1049 09/10/17 1711  GLUCAP 348* 282* 377*    Microbiology: No results found for this or any previous visit.  Coagulation  Studies: No results for input(s): LABPROT, INR in the last 72 hours.  Urinalysis: No results for input(s): COLORURINE, LABSPEC, PHURINE, GLUCOSEU, HGBUR, BILIRUBINUR, KETONESUR, PROTEINUR, UROBILINOGEN, NITRITE, LEUKOCYTESUR in the last 168 hours.  Invalid input(s): APPERANCEUR  Lipid Panel:    Component Value Date/Time   CHOL 238 (H) 02/11/2017 1928   TRIG 416 (H) 02/11/2017 1928   HDL 69 02/11/2017 1928   CHOLHDL 3.4 02/11/2017 1928   LDLCALC Comment 02/11/2017 1928    HgbA1C:  Lab Results  Component Value Date   HGBA1C 11.5 (H) 02/11/2017    Urine Drug Screen:      Component Value Date/Time   LABOPIA NONE DETECTED 11/10/2016 0909   COCAINSCRNUR POSITIVE (A) 11/10/2016 0909   LABBENZ NONE DETECTED  11/10/2016 0909   AMPHETMU NONE DETECTED 11/10/2016 0909   THCU NONE DETECTED 11/10/2016 0909   LABBARB NONE DETECTED 11/10/2016 0909    Alcohol Level: No results for input(s): ETH in the last 168 hours.  Other results: EKG: sinus rhythm at 60 bpm, RBBB.  Imaging: Ct Head Wo Contrast  Result Date: 09/10/2017 CLINICAL DATA:  Slurred speech and right upper extremity numbness EXAM: CT HEAD WITHOUT CONTRAST TECHNIQUE: Contiguous axial images were obtained from the base of the skull through the vertex without intravenous contrast. COMPARISON:  Head CT November 10, 2016 and brain MRI November 10, 2016 FINDINGS: Brain: The ventricles are normal in size and configuration. There is no intracranial mass, hemorrhage, extra-axial fluid collection, or midline shift. There is slight small vessel disease in the centra semiovale bilaterally. Elsewhere gray-white compartments appear normal. No acute infarct demonstrable. Vascular: No hyperdense vessel.  No vascular calcification evident. Skull: Bony calvarium appears intact. Sinuses/Orbits: Visualized paranasal sinuses are clear. Visualized orbits appear symmetric bilaterally. Other: Mastoid air cells are clear. IMPRESSION: Slight periventricular small vessel disease. No acute infarct evident. No mass or hemorrhage. Study otherwise unremarkable. Electronically Signed   By: Bretta Bang III M.D.   On: 09/10/2017 10:42   Mr Brain Wo Contrast  Result Date: 09/10/2017 CLINICAL DATA:  Neuro deficits, subacute. Episode of slurred speech and perioral numbness. Hyperglycemia. Intermittent right upper extremity numbness. EXAM: MRI HEAD WITHOUT CONTRAST MRA HEAD WITHOUT CONTRAST TECHNIQUE: Multiplanar, multiecho pulse sequences of the brain and surrounding structures were obtained without intravenous contrast. Angiographic images of the head were obtained using MRA technique without contrast. COMPARISON:  CT head without contrast. FINDINGS: MRI HEAD FINDINGS Brain: An acute  nonhemorrhagic infarct is present within the left thalamus measuring 8 mm. Other more remote lacunar infarcts are present in the thalami bilaterally. Periventricular and subcortical T2 changes bilaterally are similar to the prior study. Ventricles are of normal size. No significant extra-axial fluid collection is present. Internal auditory canals are normal. The brainstem and cerebellum is within normal limits. Vascular: Flow is present in the major intracranial arteries. Skull and upper cervical spine: The skull base is within normal limits. Craniocervical junction is normal. Sinuses/Orbits: Mild circumferential mucosal thickening is present in the maxillary sinuses bilaterally, left greater than right. The paranasal sinuses are otherwise clear. There is minimal fluid in the inferior mastoid air cells. Globes and orbits are within normal limits. MRA HEAD FINDINGS Internal carotid arteries are within normal limits from the high cervical segments through the ICA termini bilaterally. Posterior communicating arteries are present bilaterally. The A1 and M1 segments are intact. The anterior communicating artery is patent. MCA bifurcations are intact. There is some attenuation of distal MCA branch vessels bilaterally. The left vertebral artery is  the dominant vessel. Both posterior cerebral arteries originate from the basilar tip. The basilar artery is within normal limits. The left posterior cerebral artery originates from a left P1 segment and posterior communicating artery. The right posterior cerebral artery is of fetal type with a small right P1 segment. There is segmental narrowing throughout the PCA vessels bilaterally. This is more proximal on the right and distal on the left. IMPRESSION: 1. Acute nonhemorrhagic 8 mm infarct of the lateral left thalamus. 2. Other more remote lacunar infarcts of the thalami bilaterally. Diffuse periventricular and subcortical white matter changes bilaterally are moderately advanced  for age. This likely reflects the sequela of chronic microvascular ischemia. 3. MRA circle of Willis demonstrates no significant proximal stenosis, aneurysm, or proximal branch vessel occlusion. 4. Mild diffuse small vessel disease is present on the MRA 5. Segmental narrowing is more profound posterior than anterior. Disease is noted proximally on the right and more distally on the left. Electronically Signed   By: Marin Robertshristopher  Mattern M.D.   On: 09/10/2017 14:05   [place] Carotid Bilateral (at [facility] And [facility] Only)  Result Date: 09/10/2017 CLINICAL DATA:  Stroke EXAM: BILATERAL CAROTID DUPLEX ULTRASOUND TECHNIQUE: Wallace CullensGray scale imaging, color Doppler and duplex ultrasound were performed of bilateral carotid and vertebral arteries in the neck. COMPARISON:  None. FINDINGS: Criteria: Quantification of carotid stenosis is based on velocity parameters that correlate the residual internal carotid diameter with NASCET-based stenosis levels, using the diameter of the distal internal carotid lumen as the denominator for stenosis measurement. The following velocity measurements were obtained: RIGHT ICA:  116 cm/sec CCA:  117 cm/sec SYSTOLIC ICA/CCA RATIO:  1.2 DIASTOLIC ICA/CCA RATIO:  1.7 ECA:  89 cm/sec LEFT ICA:  91 cm/sec CCA:  140 cm/sec SYSTOLIC ICA/CCA RATIO:  0.9 DIASTOLIC ICA/CCA RATIO:  1.6 ECA:  79 cm/sec RIGHT CAROTID ARTERY: Mild soft plaque along the wall of the bulb. Low resistance internal carotid Doppler pattern. RIGHT VERTEBRAL ARTERY:  Antegrade. LEFT CAROTID ARTERY: Minimal intimal thickening in the bulb. Low resistance internal carotid Doppler pattern. LEFT VERTEBRAL ARTERY:  Antegrade. IMPRESSION: Less than 50% stenosis in the right and left internal carotid arteries. Electronically Signed   By: Jolaine ClickArthur  Hoss M.D.   On: 09/10/2017 15:10   Mr Maxine GlennMra Head/brain ZOWo Cm  Result Date: 09/10/2017 CLINICAL DATA:  Neuro deficits, subacute. Episode of slurred speech and perioral numbness. Hyperglycemia. Intermittent  right upper extremity numbness. EXAM: MRI HEAD WITHOUT CONTRAST MRA HEAD WITHOUT CONTRAST TECHNIQUE: Multiplanar, multiecho pulse sequences of the brain and surrounding structures were obtained without intravenous contrast. Angiographic images of the head were obtained using MRA technique without contrast. COMPARISON:  CT head without contrast. FINDINGS: MRI HEAD FINDINGS Brain: An acute nonhemorrhagic infarct is present within the left thalamus measuring 8 mm. Other more remote lacunar infarcts are present in the thalami bilaterally. Periventricular and subcortical T2 changes bilaterally are similar to the prior study. Ventricles are of normal size. No significant extra-axial fluid collection is present. Internal auditory canals are normal. The brainstem and cerebellum is within normal limits. Vascular: Flow is present in the major intracranial arteries. Skull and upper cervical spine: The skull base is within normal limits. Craniocervical junction is normal. Sinuses/Orbits: Mild circumferential mucosal thickening is present in the maxillary sinuses bilaterally, left greater than right. The paranasal sinuses are otherwise clear. There is minimal fluid in the inferior mastoid air cells. Globes and orbits are within normal limits. MRA HEAD FINDINGS Internal carotid arteries are within normal limits from the high  cervical segments through the ICA termini bilaterally. Posterior communicating arteries are present bilaterally. The A1 and M1 segments are intact. The anterior communicating artery is patent. MCA bifurcations are intact. There is some attenuation of distal MCA branch vessels bilaterally. The left vertebral artery is the dominant vessel. Both posterior cerebral arteries originate from the basilar tip. The basilar artery is within normal limits. The left posterior cerebral artery originates from a left P1 segment and posterior communicating artery. The right posterior cerebral artery is of fetal type with a  small right P1 segment. There is segmental narrowing throughout the PCA vessels bilaterally. This is more proximal on the right and distal on the left. IMPRESSION: 1. Acute nonhemorrhagic 8 mm infarct of the lateral left thalamus. 2. Other more remote lacunar infarcts of the thalami bilaterally. Diffuse periventricular and subcortical white matter changes bilaterally are moderately advanced for age. This likely reflects the sequela of chronic microvascular ischemia. 3. MRA circle of Willis demonstrates no significant proximal stenosis, aneurysm, or proximal branch vessel occlusion. 4. Mild diffuse small vessel disease is present on the MRA 5. Segmental narrowing is more profound posterior than anterior. Disease is noted proximally on the right and more distally on the left. Electronically Signed   By: Marin Roberts M.D.   On: 09/10/2017 14:05    Assessment: 61 y.o. male presenting with complaints of slurred speech and right sided paresthesias.  MRI of the brain performed and shows an acute left thalamic infarct.  Etiology likely small vessel disease.  Patient with multiple poorly controlled risk factors.  On ASA daily prior to admission.   Carotid dopplers show no evidence of hemodynamically significant stenosis.  Echocardiogram pending.  A1c pending, LDL pending.   Stroke Risk Factors - diabetes mellitus, hyperlipidemia, hypertension and smoking  Plan: 1. HgbA1c, fasting lipid panel pending 2. PT consult, OT consult, Speech consult 3. Echocardiogram pending 4. Prophylactic therapy-ASA 81mg  and Plavix 75mg  daily 5. NPO until RN stroke swallow screen 6. Telemetry monitoring 7. Frequent neuro checks 8. Smoking cessation counseling   Thana Farr, MD Neurology [phone] 09/10/2017, 7:36 PM

## 2017-09-10 NOTE — ED Notes (Signed)
Pt given meal tray and drink.

## 2017-09-10 NOTE — ED Triage Notes (Signed)
First Nurse Note:  C/O right arm numbness, hyperglycemia, diarrhea, and slurred speech.  Wife states she noticed symptoms on 09/08/2017

## 2017-09-10 NOTE — Progress Notes (Signed)
Nutrition Brief Note  Patient identified on the Malnutrition Screening Tool (MST) Report  Wt Readings from Last 15 Encounters:  09/10/17 160 lb (72.6 kg)  02/11/17 149 lb 4.8 oz (67.7 kg)  12/03/16 154 lb 14.4 oz (70.3 kg)  11/10/16 142 lb 7 oz (64.6 kg)  08/05/15 145 lb (65.8 kg)   Spoke with Mr. Dyar at bedside. He reports normally eating eggs, sausage, ham, grits, and toast for breakfast. Eats cheeseburgers, subway or ham and cheese for lunch. Normally eats chicken, mac and cheese, vegetables for dinner, a normal cooked meal. He reports drinking ensure 3 times per day at home. States he has lost weight from 180-190 pounds down to 160 pounds in the past 2 months but per chart there is no evidence of weight loss. Patient's NFPE WNL, some mild wasting at calves, but otherwise ok.  Body mass index is 25.82 kg/m. Patient meets criteria for overweight based on current BMI.   Current diet order is carb modified, patient is consuming approximately 100% of meals at this time. Labs and medications reviewed.   No nutrition interventions warranted at this time. If nutrition issues arise, please consult RD.   Satira Anis. Charmane Protzman, MS, RD LDN Inpatient Clinical Dietitian Pager [phone]

## 2017-09-10 NOTE — ED Provider Notes (Signed)
[facility] Emergency Department Provider Note   ____________________________________________   First MD Initiated Contact with Patient 09/10/17 [phone]     (approximate)  I have reviewed the triage vital signs and the nursing notes.   HISTORY  Chief Complaint Hyperglycemia and Numbness    HPI Evan AnoDuncan E Nevels Sr. is a 62 y.o. male reports that last may be experiencing a feeling of numbness and tingling sometime around or before bed at 1:00 in the morning.  She felt a slight tingling feeling and a little bit of weakness in the right arm and this morning when he woke up his speech was abnormal.  He still reports he has a slight feeling of a thickening of his speech and a tingling feeling in the tip of his hand but his speech has improved quite a bit from when he woke up.  He also noticed his blood sugar was high and took 10 units of insulin just prior to arrival  He does have a history of stroke, was on the left side and he said had a similar feeling.  No nausea vomiting.  No headache.  No chest pain or trouble breathing.  No weakness in the legs.  No trouble walking.  His face looks a little bit weak earlier as well but he cannot recall what side.  Past Medical History:  Diagnosis Date  . Diabetes 1.5, managed as type 2 (HCC)   . Diabetes mellitus without complication (HCC)   . Hypercholesterolemia   . Hypertension   . Pain in both feet   . Stroke [facility](HCC)     Patient Active Problem List   Diagnosis Date Noted  . CVA (cerebral vascular accident) (HCC) 09/10/2017  . Hyperlipidemia 02/11/2017  . Acute CVA (cerebrovascular accident) (HCC) 11/10/2016  . Left sided numbness 11/10/2016  . Slurred speech 11/10/2016  . Essential hypertension 11/10/2016  . Diabetes (HCC) 11/10/2016    History reviewed. No pertinent surgical history.  Prior to Admission medications   Medication Sig Start Date End Date Taking? Authorizing Provider  aspirin 81 MG EC tablet Take  1 tablet (81 mg total) by mouth daily. 02/03/17  Yes Virl Axehaplin, Don C, MD  atorvastatin (LIPITOR) 40 MG tablet Take 1 tablet (40 mg total) by mouth daily. 07/19/17 07/19/18 Yes Doles-Johnson, Teah, NP  gabapentin (NEURONTIN) 300 MG capsule Take 1 capsule (300 mg total) by mouth 3 (three) times daily. 02/12/17 02/12/18 Yes Doles-Johnson, Teah, NP  glipiZIDE (GLUCOTROL) 5 MG tablet Take 1 tablet (5 mg total) by mouth daily before breakfast. 07/19/17  Yes Doles-Johnson, Teah, NP  insulin NPH-regular Human (NOVOLIN 70/30) (70-30) 100 UNIT/ML injection Inject 10 units under the skin 2 (two) times a day with meals 02/12/17  Yes Doles-Johnson, Teah, NP  lisinopril (PRINIVIL,ZESTRIL) 40 MG tablet Take 1 tablet (40 mg total) by mouth daily. 07/19/17 07/19/18 Yes Doles-Johnson, Teah, NP  meloxicam (MOBIC) 15 MG tablet Take 1 tablet (15 mg total) by mouth daily. 11/11/16  Yes Auburn BilberryPatel, Shreyang, MD  glucose blood (FREESTYLE INSULINX TEST) test strip Use as instructed 03/08/16   Myrna BlazerSchaevitz, David Matthew, MD  Lancets (FREESTYLE) lancets Use for glucose checks four times daily 03/08/16   Schaevitz, Myra Rudeavid Matthew, MD    Allergies Patient has no known allergies.  Family History  Problem Relation Age of Onset  . Hypertension Mother   . Hypertension Father   . Diabetes Father     Social History Social History   Tobacco Use  . Smoking status: Current Every  Day Smoker    Packs/day: 1.00    Types: Cigarettes    Start date: 53  . Smokeless tobacco: Never Used  Substance Use Topics  . Alcohol use: Yes    Alcohol/week: 0.6 oz    Types: 1 Cans of beer per week  . Drug use: No    Review of Systems Constitutional: No fever/chills Eyes: No visual changes. ENT: No sore throat. Cardiovascular: Denies chest pain. Respiratory: Denies shortness of breath. Gastrointestinal: No abdominal pain.  No nausea, no vomiting.  No diarrhea.  No constipation. Genitourinary: Negative for dysuria. Musculoskeletal: Negative for back  pain. Skin: Negative for rash. Neurological: Negative for headaches    ____________________________________________   PHYSICAL EXAM:  VITAL SIGNS: ED Triage Vitals  Enc Vitals Group     BP 09/10/17 0922 (!) 140/92     Pulse Rate 09/10/17 0922 79     Resp 09/10/17 0922 18     Temp 09/10/17 0922 97.9 F (36.6 C)     Temp Source 09/10/17 0922 Oral     SpO2 09/10/17 0922 100 %     Weight 09/10/17 0923 160 lb (72.6 kg)     Height 09/10/17 0923 5\' 6"  (1.676 m)     Head Circumference --      Peak Flow --      Pain Score 09/10/17 0923 8     Pain Loc --      Pain Edu? --      Excl. in GC? --     Constitutional: Alert and oriented. Well appearing and in no acute distress. Eyes: Conjunctivae are normal. Head: Atraumatic. Nose: No congestion/rhinnorhea. Mouth/Throat: Mucous membranes are moist. Neck: No stridor.   Cardiovascular: Normal rate, regular rhythm. Grossly normal heart sounds.  Good peripheral circulation. Respiratory: Normal respiratory effort.  No retractions. Lungs CTAB. Gastrointestinal: Soft and nontender. No distention. Musculoskeletal: No lower extremity tenderness nor edema. Neurologic:  Normal speech and language. No gross focal neurologic deficits are appreciated except for some slight decrease in sensation across the right hand, also slight thickness of speech though is able to enunciate words well.  No aphasia.  Questionably some very mild dysarthria  Skin:  Skin is warm, dry and intact. No rash noted. Psychiatric: Mood and affect are normal. Speech and behavior are normal.  ____________________________________________   LABS (all labs ordered are listed, but only abnormal results are displayed)  Labs Reviewed  BASIC METABOLIC PANEL - Abnormal; Notable for the following components:      Result Value   Sodium 133 (*)    Glucose, Bld 331 (*)    All other components within normal limits  CBC - Abnormal; Notable for the following components:   RDW 14.6  (*)    All other components within normal limits  GLUCOSE, CAPILLARY - Abnormal; Notable for the following components:   Glucose-Capillary 348 (*)    All other components within normal limits  GLUCOSE, CAPILLARY - Abnormal; Notable for the following components:   Glucose-Capillary 282 (*)    All other components within normal limits  TROPONIN I  CBC  CREATININE, SERUM  CBG MONITORING, ED   ____________________________________________  EKG  Reviewed and her by me at 9:35 AM Heart rate 60 QRS 150 QTC 440 Normal sinus rhythm, right bundle branch block, several notable T wave inversions are noted in lateral limb and precordial leads.  Of note the patient denies any cardiac or pulmonary symptoms.  No chest pain. ____________________________________________  RADIOLOGY  Ct Head Wo Contrast  Result Date: 09/10/2017 CLINICAL DATA:  Slurred speech and right upper extremity numbness EXAM: CT HEAD WITHOUT CONTRAST TECHNIQUE: Contiguous axial images were obtained from the base of the skull through the vertex without intravenous contrast. COMPARISON:  Head CT November 10, 2016 and brain MRI November 10, 2016 FINDINGS: Brain: The ventricles are normal in size and configuration. There is no intracranial mass, hemorrhage, extra-axial fluid collection, or midline shift. There is slight small vessel disease in the centra semiovale bilaterally. Elsewhere gray-white compartments appear normal. No acute infarct demonstrable. Vascular: No hyperdense vessel.  No vascular calcification evident. Skull: Bony calvarium appears intact. Sinuses/Orbits: Visualized paranasal sinuses are clear. Visualized orbits appear symmetric bilaterally. Other: Mastoid air cells are clear. IMPRESSION: Slight periventricular small vessel disease. No acute infarct evident. No mass or hemorrhage. Study otherwise unremarkable. Electronically Signed   By: Bretta Bang III M.D.   On: 09/10/2017 10:42    CT the head no acute infarct.   Reviewed by me, viewed by me. ____________________________________________   PROCEDURES  Procedure(s) performed: None  Procedures  Critical Care performed: No  ____________________________________________   INITIAL IMPRESSION / ASSESSMENT AND PLAN / ED COURSE  Pertinent labs & imaging results that were available during my care of the patient were reviewed by me and considered in my medical decision making (see chart for details).  Patient presents for new neurologic symptoms including slight numbness over the right arm and speech change.  He is currently alert well oriented, in no distress.  His symptoms were noticed at approximately 1 AM, and also when he woke up.  Van negative.  No signs of large vessel occlusion.  Patient presents well outside of the TPA window.  Symptoms unclear if they represent that of a stroke, though they sound concerning for that he does report his speech is improved somewhat now.  At this point, suspect very mild stroke versus TIA.  Discussed with Dr. Thad Ranger of neurology who advises admission to hospital.  Patient also with new T wave abnormalities on EKG, however no cardiac or pulmonary symptoms and his troponin is negative.  Ongoing care and further workup under the hospitalist service      ____________________________________________   FINAL CLINICAL IMPRESSION(S) / ED DIAGNOSES  Final diagnoses:  EKG abnormalities  Cerebrovascular accident (CVA), unspecified mechanism (HCC)      NEW MEDICATIONS STARTED DURING THIS VISIT:  New Prescriptions   No medications on file     Note:  This document was prepared using Dragon voice recognition software and may include unintentional dictation errors.     Sharyn Creamer, MD 09/10/17 1204

## 2017-09-10 NOTE — Progress Notes (Signed)
SLP Cancellation Note  Patient Details Name: Sandie AnoDuncan E Masi Sr. MRN: [id number] DOB: 05-27-1956   Cancelled treatment:       Reason Eval/Treat Not Completed: Patient at procedure or test/unavailable(chart reviewed; pt is out of room at this time. ST will f/u w/ Cognitive assessment when pt is available in room).    Jerilynn SomKatherine Natalina Wieting, MS, CCC-SLP [place] Schewe 09/10/2017, 1:16 PM

## 2017-09-11 ENCOUNTER — Inpatient Hospital Stay: Admit: 2017-09-11 | Payer: Self-pay

## 2017-09-11 LAB — GLUCOSE, CAPILLARY
Glucose-Capillary: 290 mg/dL — ABNORMAL HIGH (ref 65–99)
Glucose-Capillary: 167 mg/dL — ABNORMAL HIGH (ref 65–99)

## 2017-09-11 LAB — HEMOGLOBIN A1C
Hgb A1c MFr Bld: 13.5 % — ABNORMAL HIGH (ref 4.8–5.6)
Mean Plasma Glucose: 340.75 mg/dL

## 2017-09-11 LAB — LIPID PANEL
Cholesterol: 199 mg/dL (ref 0–200)
HDL: 73 mg/dL (ref >40)
LDL Cholesterol: 91 mg/dL (ref 0–99)
Total CHOL/HDL Ratio: 2.7 RATIO
Triglycerides: 176 mg/dL — ABNORMAL HIGH (ref <150)
VLDL: 35 mg/dL (ref 0–40)

## 2017-09-11 MED ORDER — GLUCOSE BLOOD VI STRP: ORAL_STRIP | 1 refills | Status: DC

## 2017-09-11 MED ORDER — NICOTINE 21 MG/24HR TD PT24: 21.0000 mg | MEDICATED_PATCH | Freq: Every day | TRANSDERMAL | 0 refills | Status: DC

## 2017-09-11 MED ORDER — FREESTYLE LANCETS MISC: 1 refills | Status: DC

## 2017-09-11 MED ORDER — LISINOPRIL 40 MG PO TABS: 40.0000 mg | ORAL_TABLET | Freq: Every day | ORAL | 3 refills | Status: DC

## 2017-09-11 MED ORDER — INSULIN NPH ISOPHANE & REGULAR (70-30) 100 UNIT/ML ~~LOC~~ SUSP: SUBCUTANEOUS | 0 refills | Status: DC

## 2017-09-11 MED ORDER — ATORVASTATIN CALCIUM 40 MG PO TABS: 40.0000 mg | ORAL_TABLET | Freq: Every day | ORAL | 0 refills | Status: DC

## 2017-09-11 MED ORDER — INSULIN ASPART PROT & ASPART (70-30 MIX) 100 UNIT/ML ~~LOC~~ SUSP: 15.0000 [IU] | Freq: Two times a day (BID) | SUBCUTANEOUS | 11 refills | Status: DC

## 2017-09-11 MED ORDER — CLOPIDOGREL BISULFATE 75 MG PO TABS: 75.0000 mg | ORAL_TABLET | Freq: Every day | ORAL | 0 refills | Status: DC

## 2017-09-11 MED ORDER — GLIPIZIDE 5 MG PO TABS: 5.0000 mg | ORAL_TABLET | Freq: Every day | ORAL | 2 refills | Status: DC

## 2017-09-11 NOTE — Consult Note (Signed)
No changes in examination. Still complaining of dysarthria and paraesthesias.   Past Medical History:  Diagnosis Date  . Diabetes 1.5, managed as type 2 (HCC)   . Diabetes mellitus without complication (HCC)   . Hypercholesterolemia   . Hypertension   . Pain in both feet   . Stroke [facility])     History reviewed. No pertinent surgical history.  Family History  Problem Relation Age of Onset  . Hypertension Mother   . Hypertension Father   . Diabetes Father    Social History:  reports that he has been smoking cigarettes.  He started smoking about 37 years ago. He has been smoking about 1.00 pack per day. he has never used smokeless tobacco. He reports that he drinks about 0.6 oz of alcohol per week. He reports that he does not use drugs.  Allergies: No Known Allergies  Medications:  I have reviewed the patient's current medications. Prior to Admission:  Medications Prior to Admission  Medication Sig Dispense Refill Last Dose  . aspirin 81 MG EC tablet Take 1 tablet (81 mg total) by mouth daily. 30 tablet  09/09/2017 at 0800  . gabapentin (NEURONTIN) 300 MG capsule Take 1 capsule (300 mg total) by mouth 3 (three) times daily. 90 capsule 0 Past Week at 1800  . meloxicam (MOBIC) 15 MG tablet Take 1 tablet (15 mg total) by mouth daily. 30 tablet 0 Past Week at 0800   Scheduled: . aspirin EC  81 mg Oral Daily  . atorvastatin  40 mg Oral Daily  . clopidogrel  75 mg Oral Daily  . enoxaparin (LOVENOX) injection  40 mg Subcutaneous Q24H  . feeding supplement (GLUCERNA SHAKE)  237 mL Oral TID BM  . gabapentin  300 mg Oral TID  . Influenza vac split quadrivalent PF  0.5 mL Intramuscular Tomorrow-1000  . insulin aspart  0-9 Units Subcutaneous TID WC  . insulin aspart  2 Units Subcutaneous BID  . insulin detemir  6 Units Subcutaneous BID  . nicotine  21 mg Transdermal Daily    ROS: History obtained from the patient  General ROS: negative for - chills, fatigue, fever, night sweats,  weight gain or weight loss Psychological ROS: negative for - behavioral disorder, hallucinations, memory difficulties, mood swings or suicidal ideation Ophthalmic ROS: negative for - blurry vision, double vision, eye pain or loss of vision ENT ROS: negative for - epistaxis, nasal discharge, oral lesions, sore throat, tinnitus or vertigo Allergy and Immunology ROS: negative for - hives or itchy/watery eyes Hematological and Lymphatic ROS: negative for - bleeding problems, bruising or swollen lymph nodes Endocrine ROS: negative for - galactorrhea, hair pattern changes, polydipsia/polyuria or temperature intolerance Respiratory ROS: negative for - cough, hemoptysis, shortness of breath or wheezing Cardiovascular ROS: negative for - chest pain, dyspnea on exertion, edema or irregular heartbeat Gastrointestinal ROS: negative for - abdominal pain, diarrhea, hematemesis, nausea/vomiting or stool incontinence Genito-Urinary ROS: negative for - dysuria, hematuria, incontinence or urinary frequency/urgency Musculoskeletal ROS: negative for - joint swelling or muscular weakness Neurological ROS: as noted in HPI Dermatological ROS: negative for rash and skin lesion changes  Physical Examination: Blood pressure 125/76, pulse 73, temperature 98.2 F (36.8 C), temperature source Oral, resp. rate 16, height 5\' 6"  (1.676 m), weight 160 lb (72.6 kg), SpO2 100 %.  HEENT-  Normocephalic, no lesions, without obvious abnormality.  Normal external eye and conjunctiva.  Normal TM's bilaterally.  Normal auditory canals and external ears. Normal external nose, mucus membranes and septum.  Normal pharynx. Cardiovascular- S1, S2 normal, pulses palpable throughout   Lungs- chest clear, no wheezing, rales, normal symmetric air entry Abdomen- soft, non-tender; bowel sounds normal; no masses,  no organomegaly Extremities- no edema Lymph-no adenopathy palpable Musculoskeletal-no joint tenderness, deformity or  swelling Skin-warm and dry, no hyperpigmentation, vitiligo, or suspicious lesions  Neurological Examination   Mental Status: Alert, oriented, thought content appropriate.  Speech fluent without evidence of aphasia.  Able to follow 3 step commands without difficulty. Cranial Nerves: II: Discs flat bilaterally; Visual fields grossly normal, pupils equal, round, reactive to light and accommodation III,IV, VI: ptosis not present, extra-ocular motions intact bilaterally V,VII: smile symmetric, facial light touch sensation normal bilaterally VIII: hearing normal bilaterally IX,X: gag reflex present XI: bilateral shoulder shrug XII: midline tongue extension Motor: Right : Upper extremity   5/5    Left:     Upper extremity   5-/5  Lower extremity   5/5     Lower extremity   5-/5 Tone and bulk:normal tone throughout; no atrophy noted Sensory: Pinprick and light touch decreased in the left hand Deep Tendon Reflexes: 2+ and symmetric throughout Plantars: Right: upgoing   Left: upgoing Cerebellar: Normal finger-to-nose and normal heel-to-shin testing bilaterally Gait: not tested due to safety concerns    Laboratory Studies:  Basic Metabolic Panel: Recent Labs  Lab 09/10/17 0938  NA 133*  K 4.2  CL 101  CO2 23  GLUCOSE 331*  BUN 16  CREATININE 1.23  CALCIUM 10.1    Liver Function Tests: No results for input(s): AST, ALT, ALKPHOS, BILITOT, PROT, ALBUMIN in the last 168 hours. No results for input(s): LIPASE, AMYLASE in the last 168 hours. No results for input(s): AMMONIA in the last 168 hours.  CBC: Recent Labs  Lab 09/10/17 0938  WBC 6.5  HGB 16.0  HCT 47.4  MCV 90.4  PLT 247    Cardiac Enzymes: Recent Labs  Lab 09/10/17 0938  TROPONINI <0.03    BNP: Invalid input(s): POCBNP  CBG: Recent Labs  Lab 09/10/17 1049 09/10/17 1711 09/10/17 2031 09/11/17 0734 09/11/17 1140  GLUCAP 282* 377* 176* 290* 167*    Microbiology: No results found for this or any  previous visit.  Coagulation Studies: No results for input(s): LABPROT, INR in the last 72 hours.  Urinalysis: No results for input(s): COLORURINE, LABSPEC, PHURINE, GLUCOSEU, HGBUR, BILIRUBINUR, KETONESUR, PROTEINUR, UROBILINOGEN, NITRITE, LEUKOCYTESUR in the last 168 hours.  Invalid input(s): APPERANCEUR  Lipid Panel:    Component Value Date/Time   CHOL 199 09/11/2017 0513   CHOL 238 (H) 02/11/2017 1928   TRIG 176 (H) 09/11/2017 0513   HDL 73 09/11/2017 0513   HDL 69 02/11/2017 1928   CHOLHDL 2.7 09/11/2017 0513   VLDL 35 09/11/2017 0513   LDLCALC 91 09/11/2017 0513   LDLCALC Comment 02/11/2017 1928    HgbA1C:  Lab Results  Component Value Date   HGBA1C 13.5 (H) 09/11/2017    Urine Drug Screen:      Component Value Date/Time   LABOPIA NONE DETECTED 11/10/2016 0909   COCAINSCRNUR POSITIVE (A) 11/10/2016 0909   LABBENZ NONE DETECTED 11/10/2016 0909   AMPHETMU NONE DETECTED 11/10/2016 0909   THCU NONE DETECTED 11/10/2016 0909   LABBARB NONE DETECTED 11/10/2016 0909    Alcohol Level: No results for input(s): ETH in the last 168 hours.  Other results: EKG: sinus rhythm at 60 bpm, RBBB.  Imaging: Ct Head Wo Contrast  Result Date: 09/10/2017 CLINICAL DATA:  Slurred speech and right upper  extremity numbness EXAM: CT HEAD WITHOUT CONTRAST TECHNIQUE: Contiguous axial images were obtained from the base of the skull through the vertex without intravenous contrast. COMPARISON:  Head CT November 10, 2016 and brain MRI November 10, 2016 FINDINGS: Brain: The ventricles are normal in size and configuration. There is no intracranial mass, hemorrhage, extra-axial fluid collection, or midline shift. There is slight small vessel disease in the centra semiovale bilaterally. Elsewhere gray-white compartments appear normal. No acute infarct demonstrable. Vascular: No hyperdense vessel.  No vascular calcification evident. Skull: Bony calvarium appears intact. Sinuses/Orbits: Visualized paranasal  sinuses are clear. Visualized orbits appear symmetric bilaterally. Other: Mastoid air cells are clear. IMPRESSION: Slight periventricular small vessel disease. No acute infarct evident. No mass or hemorrhage. Study otherwise unremarkable. Electronically Signed   By: Bretta Bang III M.D.   On: 09/10/2017 10:42   Mr Brain Wo Contrast  Result Date: 09/10/2017 CLINICAL DATA:  Neuro deficits, subacute. Episode of slurred speech and perioral numbness. Hyperglycemia. Intermittent right upper extremity numbness. EXAM: MRI HEAD WITHOUT CONTRAST MRA HEAD WITHOUT CONTRAST TECHNIQUE: Multiplanar, multiecho pulse sequences of the brain and surrounding structures were obtained without intravenous contrast. Angiographic images of the head were obtained using MRA technique without contrast. COMPARISON:  CT head without contrast. FINDINGS: MRI HEAD FINDINGS Brain: An acute nonhemorrhagic infarct is present within the left thalamus measuring 8 mm. Other more remote lacunar infarcts are present in the thalami bilaterally. Periventricular and subcortical T2 changes bilaterally are similar to the prior study. Ventricles are of normal size. No significant extra-axial fluid collection is present. Internal auditory canals are normal. The brainstem and cerebellum is within normal limits. Vascular: Flow is present in the major intracranial arteries. Skull and upper cervical spine: The skull base is within normal limits. Craniocervical junction is normal. Sinuses/Orbits: Mild circumferential mucosal thickening is present in the maxillary sinuses bilaterally, left greater than right. The paranasal sinuses are otherwise clear. There is minimal fluid in the inferior mastoid air cells. Globes and orbits are within normal limits. MRA HEAD FINDINGS Internal carotid arteries are within normal limits from the high cervical segments through the ICA termini bilaterally. Posterior communicating arteries are present bilaterally. The A1 and M1  segments are intact. The anterior communicating artery is patent. MCA bifurcations are intact. There is some attenuation of distal MCA branch vessels bilaterally. The left vertebral artery is the dominant vessel. Both posterior cerebral arteries originate from the basilar tip. The basilar artery is within normal limits. The left posterior cerebral artery originates from a left P1 segment and posterior communicating artery. The right posterior cerebral artery is of fetal type with a small right P1 segment. There is segmental narrowing throughout the PCA vessels bilaterally. This is more proximal on the right and distal on the left. IMPRESSION: 1. Acute nonhemorrhagic 8 mm infarct of the lateral left thalamus. 2. Other more remote lacunar infarcts of the thalami bilaterally. Diffuse periventricular and subcortical white matter changes bilaterally are moderately advanced for age. This likely reflects the sequela of chronic microvascular ischemia. 3. MRA circle of Willis demonstrates no significant proximal stenosis, aneurysm, or proximal branch vessel occlusion. 4. Mild diffuse small vessel disease is present on the MRA 5. Segmental narrowing is more profound posterior than anterior. Disease is noted proximally on the right and more distally on the left. Electronically Signed   By: Marin Roberts M.D.   On: 09/10/2017 14:05   US Carotid Bilateral (at [facility] Only)  Result Date: 09/10/2017 CLINICAL DATA:  Stroke  EXAM: BILATERAL CAROTID DUPLEX ULTRASOUND TECHNIQUE: Wallace Cullens scale imaging, color Doppler and duplex ultrasound were performed of bilateral carotid and vertebral arteries in the neck. COMPARISON:  None. FINDINGS: Criteria: Quantification of carotid stenosis is based on velocity parameters that correlate the residual internal carotid diameter with NASCET-based stenosis levels, using the diameter of the distal internal carotid lumen as the denominator for stenosis measurement. The following velocity  measurements were obtained: RIGHT ICA:  116 cm/sec CCA:  117 cm/sec SYSTOLIC ICA/CCA RATIO:  1.2 DIASTOLIC ICA/CCA RATIO:  1.7 ECA:  89 cm/sec LEFT ICA:  91 cm/sec CCA:  140 cm/sec SYSTOLIC ICA/CCA RATIO:  0.9 DIASTOLIC ICA/CCA RATIO:  1.6 ECA:  79 cm/sec RIGHT CAROTID ARTERY: Mild soft plaque along the wall of the bulb. Low resistance internal carotid Doppler pattern. RIGHT VERTEBRAL ARTERY:  Antegrade. LEFT CAROTID ARTERY: Minimal intimal thickening in the bulb. Low resistance internal carotid Doppler pattern. LEFT VERTEBRAL ARTERY:  Antegrade. IMPRESSION: Less than 50% stenosis in the right and left internal carotid arteries. Electronically Signed   By: Jolaine Click M.D.   On: 09/10/2017 15:10   Mr Maxine Glenn Head/brain ZO Cm  Result Date: 09/10/2017 CLINICAL DATA:  Neuro deficits, subacute. Episode of slurred speech and perioral numbness. Hyperglycemia. Intermittent right upper extremity numbness. EXAM: MRI HEAD WITHOUT CONTRAST MRA HEAD WITHOUT CONTRAST TECHNIQUE: Multiplanar, multiecho pulse sequences of the brain and surrounding structures were obtained without intravenous contrast. Angiographic images of the head were obtained using MRA technique without contrast. COMPARISON:  CT head without contrast. FINDINGS: MRI HEAD FINDINGS Brain: An acute nonhemorrhagic infarct is present within the left thalamus measuring 8 mm. Other more remote lacunar infarcts are present in the thalami bilaterally. Periventricular and subcortical T2 changes bilaterally are similar to the prior study. Ventricles are of normal size. No significant extra-axial fluid collection is present. Internal auditory canals are normal. The brainstem and cerebellum is within normal limits. Vascular: Flow is present in the major intracranial arteries. Skull and upper cervical spine: The skull base is within normal limits. Craniocervical junction is normal. Sinuses/Orbits: Mild circumferential mucosal thickening is present in the maxillary sinuses  bilaterally, left greater than right. The paranasal sinuses are otherwise clear. There is minimal fluid in the inferior mastoid air cells. Globes and orbits are within normal limits. MRA HEAD FINDINGS Internal carotid arteries are within normal limits from the high cervical segments through the ICA termini bilaterally. Posterior communicating arteries are present bilaterally. The A1 and M1 segments are intact. The anterior communicating artery is patent. MCA bifurcations are intact. There is some attenuation of distal MCA branch vessels bilaterally. The left vertebral artery is the dominant vessel. Both posterior cerebral arteries originate from the basilar tip. The basilar artery is within normal limits. The left posterior cerebral artery originates from a left P1 segment and posterior communicating artery. The right posterior cerebral artery is of fetal type with a small right P1 segment. There is segmental narrowing throughout the PCA vessels bilaterally. This is more proximal on the right and distal on the left. IMPRESSION: 1. Acute nonhemorrhagic 8 mm infarct of the lateral left thalamus. 2. Other more remote lacunar infarcts of the thalami bilaterally. Diffuse periventricular and subcortical white matter changes bilaterally are moderately advanced for age. This likely reflects the sequela of chronic microvascular ischemia. 3. MRA circle of Willis demonstrates no significant proximal stenosis, aneurysm, or proximal branch vessel occlusion. 4. Mild diffuse small vessel disease is present on the MRA 5. Segmental narrowing is more profound posterior than anterior.  Disease is noted proximally on the right and more distally on the left. Electronically Signed   By: Marin Roberts M.D.   On: 09/10/2017 14:05    Assessment: 62 y.o. male presenting with complaints of slurred speech and right sided paresthesias.  MRI of the brain performed and shows an acute left thalamic infarct.  Etiology likely small vessel  disease.   - Con't dual anti platelet therapy - d/c planning  - call with questions.   - d/w patient need for compliance with medications.   Pauletta Browns

## 2017-09-11 NOTE — Discharge Summary (Signed)
[employer] [facility] [place] [facility] 62 y.o., DOB 09-Jun-1956, MRN [id number]. Admission date: 09/10/2017 Discharge Date 09/11/2017 Primary MD Patient, No Pcp Per Admitting Physician [name], MD  Admission Diagnosis  EKG abnormalities [R94.31] Stroke (cerebrum) [facility]) [I63.9] Cerebrovascular accident (CVA), unspecified mechanism (HCC) [I63.9]  Discharge Diagnosis   Active Problems: CVA (cerebral vascular accident) [name]) Diabetes type 2 Essential hypertension Lipidemia       Hospital Course  Evan Townsend  is a 62 y.o. male with a known history of diabetes, hyperlipidemia, essential hypertension and CVA without residual deficits who presents morning he had slurred speech along with some numbness around his perioral area.  Patient was admitted for acute CVA.  MRI of the brain showedAcute nonhemorrhagic 8 mm infarct of the lateral left thalamus.  He was evaluated with carotid Dopplers and MRI of the brain.  He was seen by neurology who recommended adding Plavix to current regimen.  The patient has been noncompliant with his medications due to unable to afford those.  I have discussed with case manager to assist with discharge medications.  His symptoms have mostly resolved.  He is feeling much better PT recommends no further physical therapy.                Consults  neurology  Significant Tests:  See full reports for all details    Ct Head Wo Contrast  Result Date: 09/10/2017 CLINICAL DATA:  Slurred speech and right upper extremity numbness EXAM: CT HEAD WITHOUT CONTRAST TECHNIQUE: Contiguous axial images were obtained from the base of the skull through the vertex without intravenous contrast. COMPARISON:  Head CT November 10, 2016 and brain MRI November 10, 2016 FINDINGS: Brain: The ventricles are normal in size and configuration. There is no intracranial mass, hemorrhage, extra-axial fluid collection, or midline shift. There is slight small  vessel disease in the centra semiovale bilaterally. Elsewhere gray-white compartments appear normal. No acute infarct demonstrable. Vascular: No hyperdense vessel.  No vascular calcification evident. Skull: Bony calvarium appears intact. Sinuses/Orbits: Visualized paranasal sinuses are clear. Visualized orbits appear symmetric bilaterally. Other: Mastoid air cells are clear. IMPRESSION: Slight periventricular small vessel disease. No acute infarct evident. No mass or hemorrhage. Study otherwise unremarkable. Electronically Signed   By: Bretta Bang III M.D.   On: 09/10/2017 10:42   Mr Brain Wo Contrast  Result Date: 09/10/2017 CLINICAL DATA:  Neuro deficits, subacute. Episode of slurred speech and perioral numbness. Hyperglycemia. Intermittent right upper extremity numbness. EXAM: MRI HEAD WITHOUT CONTRAST MRA HEAD WITHOUT CONTRAST TECHNIQUE: Multiplanar, multiecho pulse sequences of the brain and surrounding structures were obtained without intravenous contrast. Angiographic images of the head were obtained using MRA technique without contrast. COMPARISON:  CT head without contrast. FINDINGS: MRI HEAD FINDINGS Brain: An acute nonhemorrhagic infarct is present within the left thalamus measuring 8 mm. Other more remote lacunar infarcts are present in the thalami bilaterally. Periventricular and subcortical T2 changes bilaterally are similar to the prior study. Ventricles are of normal size. No significant extra-axial fluid collection is present. Internal auditory canals are normal. The brainstem and cerebellum is within normal limits. Vascular: Flow is present in the major intracranial arteries. Skull and upper cervical spine: The skull base is within normal limits. Craniocervical junction is normal. Sinuses/Orbits: Mild circumferential mucosal thickening is present in the maxillary sinuses bilaterally, left greater than right. The paranasal sinuses are otherwise clear. There is minimal fluid in the inferior  mastoid air cells. Globes and orbits  are within normal limits. MRA HEAD FINDINGS Internal carotid arteries are within normal limits from the high cervical segments through the ICA termini bilaterally. Posterior communicating arteries are present bilaterally. The A1 and M1 segments are intact. The anterior communicating artery is patent. MCA bifurcations are intact. There is some attenuation of distal MCA branch vessels bilaterally. The left vertebral artery is the dominant vessel. Both posterior cerebral arteries originate from the basilar tip. The basilar artery is within normal limits. The left posterior cerebral artery originates from a left P1 segment and posterior communicating artery. The right posterior cerebral artery is of fetal type with a small right P1 segment. There is segmental narrowing throughout the PCA vessels bilaterally. This is more proximal on the right and distal on the left. IMPRESSION: 1. Acute nonhemorrhagic 8 mm infarct of the lateral left thalamus. 2. Other more remote lacunar infarcts of the thalami bilaterally. Diffuse periventricular and subcortical white matter changes bilaterally are moderately advanced for age. This likely reflects the sequela of chronic microvascular ischemia. 3. MRA circle of Willis demonstrates no significant proximal stenosis, aneurysm, or proximal branch vessel occlusion. 4. Mild diffuse small vessel disease is present on the MRA 5. Segmental narrowing is more profound posterior than anterior. Disease is noted proximally on the right and more distally on the left. Electronically Signed   By: Marin Roberts M.D.   On: 09/10/2017 14:05   US Carotid Bilateral (at [facility])  Result Date: 09/10/2017 CLINICAL DATA:  Stroke EXAM: BILATERAL CAROTID DUPLEX ULTRASOUND TECHNIQUE: Wallace Cullens scale imaging, color Doppler and duplex ultrasound were performed of bilateral carotid and vertebral arteries in the neck. COMPARISON:  None. FINDINGS: Criteria:  Quantification of carotid stenosis is based on velocity parameters that correlate the residual internal carotid diameter with NASCET-based stenosis levels, using the diameter of the distal internal carotid lumen as the denominator for stenosis measurement. The following velocity measurements were obtained: RIGHT ICA:  116 cm/sec CCA:  117 cm/sec SYSTOLIC ICA/CCA RATIO:  1.2 DIASTOLIC ICA/CCA RATIO:  1.7 ECA:  89 cm/sec LEFT ICA:  91 cm/sec CCA:  140 cm/sec SYSTOLIC ICA/CCA RATIO:  0.9 DIASTOLIC ICA/CCA RATIO:  1.6 ECA:  79 cm/sec RIGHT CAROTID ARTERY: Mild soft plaque along the wall of the bulb. Low resistance internal carotid Doppler pattern. RIGHT VERTEBRAL ARTERY:  Antegrade. LEFT CAROTID ARTERY: Minimal intimal thickening in the bulb. Low resistance internal carotid Doppler pattern. LEFT VERTEBRAL ARTERY:  Antegrade. IMPRESSION: Less than 50% stenosis in the right and left internal carotid arteries. Electronically Signed   By: Jolaine Click M.D.   On: 09/10/2017 15:10   Mr Maxine Glenn Head/brain NW Cm  Result Date: 09/10/2017 CLINICAL DATA:  Neuro deficits, subacute. Episode of slurred speech and perioral numbness. Hyperglycemia. Intermittent right upper extremity numbness. EXAM: MRI HEAD WITHOUT CONTRAST MRA HEAD WITHOUT CONTRAST TECHNIQUE: Multiplanar, multiecho pulse sequences of the brain and surrounding structures were obtained without intravenous contrast. Angiographic images of the head were obtained using MRA technique without contrast. COMPARISON:  CT head without contrast. FINDINGS: MRI HEAD FINDINGS Brain: An acute nonhemorrhagic infarct is present within the left thalamus measuring 8 mm. Other more remote lacunar infarcts are present in the thalami bilaterally. Periventricular and subcortical T2 changes bilaterally are similar to the prior study. Ventricles are of normal size. No significant extra-axial fluid collection is present. Internal auditory canals are normal. The brainstem and cerebellum is  within normal limits. Vascular: Flow is present in the major intracranial arteries. Skull and upper cervical spine: The  skull base is within normal limits. Craniocervical junction is normal. Sinuses/Orbits: Mild circumferential mucosal thickening is present in the maxillary sinuses bilaterally, left greater than right. The paranasal sinuses are otherwise clear. There is minimal fluid in the inferior mastoid air cells. Globes and orbits are within normal limits. MRA HEAD FINDINGS Internal carotid arteries are within normal limits from the high cervical segments through the ICA termini bilaterally. Posterior communicating arteries are present bilaterally. The A1 and M1 segments are intact. The anterior communicating artery is patent. MCA bifurcations are intact. There is some attenuation of distal MCA branch vessels bilaterally. The left vertebral artery is the dominant vessel. Both posterior cerebral arteries originate from the basilar tip. The basilar artery is within normal limits. The left posterior cerebral artery originates from a left P1 segment and posterior communicating artery. The right posterior cerebral artery is of fetal type with a small right P1 segment. There is segmental narrowing throughout the PCA vessels bilaterally. This is more proximal on the right and distal on the left. IMPRESSION: 1. Acute nonhemorrhagic 8 mm infarct of the lateral left thalamus. 2. Other more remote lacunar infarcts of the thalami bilaterally. Diffuse periventricular and subcortical white matter changes bilaterally are moderately advanced for age. This likely reflects the sequela of chronic microvascular ischemia. 3. MRA circle of Willis demonstrates no significant proximal stenosis, aneurysm, or proximal branch vessel occlusion. 4. Mild diffuse small vessel disease is present on the MRA 5. Segmental narrowing is more profound posterior than anterior. Disease is noted proximally on the right and more distally on the left.  Electronically Signed   By: Marin Roberts M.D.   On: 09/10/2017 14:05       Today   Subjective:   Evan Townsend patient feeling better still has some right upper extremity numbness but mostly resolved Objective:   Blood pressure 125/76, pulse 73, temperature 98.2 F (36.8 C), temperature source Oral, resp. rate 16, height 5\' 6"  (1.676 m), weight 160 lb (72.6 kg), SpO2 100 %.  .  Intake/Output Summary (Last 24 hours) at 09/11/2017 1406 Last data filed at 09/11/2017 1200 Gross per 24 hour  Intake 600 ml  Output -  Net 600 ml    Exam VITAL SIGNS: Blood pressure 125/76, pulse 73, temperature 98.2 F (36.8 C), temperature source Oral, resp. rate 16, height 5\' 6"  (1.676 m), weight 160 lb (72.6 kg), SpO2 100 %.  GENERAL:  62 y.o.-year-old patient lying in the bed with no acute distress.  EYES: Pupils equal, round, reactive to light and accommodation. No scleral icterus. Extraocular muscles intact.  HEENT: Head atraumatic, normocephalic. Oropharynx and nasopharynx clear.  NECK:  Supple, no jugular venous distention. No thyroid enlargement, no tenderness.  LUNGS: Normal breath sounds bilaterally, no wheezing, rales,rhonchi or crepitation. No use of accessory muscles of respiration.  CARDIOVASCULAR: S1, S2 normal. No murmurs, rubs, or gallops.  ABDOMEN: Soft, nontender, nondistended. Bowel sounds present. No organomegaly or mass.  EXTREMITIES: No pedal edema, cyanosis, or clubbing.  NEUROLOGIC: Cranial nerves II through XII are intact. Muscle strength 5/5 in all extremities. Sensation intact. Gait not checked.  PSYCHIATRIC: The patient is alert and oriented x 3.  SKIN: No obvious rash, lesion, or ulcer.   Data Review     CBC w Diff:  Lab Results  Component Value Date   WBC 6.5 09/10/2017   HGB 16.0 09/10/2017   HGB 15.6 03/15/2013   HCT 47.4 09/10/2017   HCT 45.7 03/15/2013   PLT 247 09/10/2017   PLT  188 03/15/2013   LYMPHOPCT 35 11/10/2016   LYMPHOPCT 44.9 03/15/2013    MONOPCT 9 11/10/2016   MONOPCT 8.1 03/15/2013   EOSPCT 2 11/10/2016   EOSPCT 1.6 03/15/2013   BASOPCT 1 11/10/2016   BASOPCT 0.9 03/15/2013   CMP:  Lab Results  Component Value Date   NA 133 (L) 09/10/2017   NA 140 02/11/2017   NA 135 (L) 03/15/2013   K 4.2 09/10/2017   K 4.3 03/15/2013   CL 101 09/10/2017   CL 97 (L) 03/15/2013   CO2 23 09/10/2017   CO2 30 03/15/2013   BUN 16 09/10/2017   BUN 18 02/11/2017   BUN 21 (H) 03/15/2013   CREATININE 1.23 09/10/2017   CREATININE 1.30 03/15/2013   PROT 7.2 02/11/2017   PROT 7.9 03/15/2013   ALBUMIN 4.7 02/11/2017   ALBUMIN 4.0 03/15/2013   BILITOT <0.2 02/11/2017   BILITOT 0.2 03/15/2013   ALKPHOS 136 (H) 02/11/2017   ALKPHOS 131 03/15/2013   AST 19 02/11/2017   AST 11 (L) 03/15/2013   ALT 28 02/11/2017   ALT 31 03/15/2013  .  Micro Results No results found for this or any previous visit (from the past 240 hour(s)).      Code Status Orders  (From admission, onward)        Start     Ordered   09/10/17 1115  Full code  Continuous     09/10/17 1114    Code Status History    Date Active Date Inactive Code Status Order ID Comments User Context   11/10/2016 14:47 11/11/2016 18:53 Full Code [id number]  Katharina CaperVaickute, Rima, MD Inpatient    Advance Directive Documentation     Most Recent Value  Type of Advance Directive  Healthcare Power of Attorney  Pre-existing out of facility DNR order (yellow form or pink MOST form)  No data  "MOST" Form in Place?  No data          Follow-up Information    [facility] Follow up in 1 week(s).   Specialty:  Primary Care Why:  new patient for dm2, htn Contact information: [address] [place] [id number] [phone]          Discharge Medications   Allergies as of 09/11/2017   No Known Allergies     Medication List    TAKE these medications   aspirin 81 MG EC tablet Take 1 tablet (81 mg total) by mouth  daily.   atorvastatin 40 MG tablet Commonly known as:  LIPITOR Take 1 tablet (40 mg total) by mouth daily.   clopidogrel 75 MG tablet Commonly known as:  PLAVIX Take 1 tablet (75 mg total) by mouth daily. Start taking on:  09/12/2017   freestyle lancets Use for glucose checks four times daily   gabapentin 300 MG capsule Commonly known as:  NEURONTIN Take 1 capsule (300 mg total) by mouth 3 (three) times daily.   glipiZIDE 5 MG tablet Commonly known as:  GLUCOTROL Take 1 tablet (5 mg total) by mouth daily before breakfast.   glucose blood test strip Commonly known as:  FREESTYLE INSULINX TEST Use as instructed   insulin NPH-regular Human (70-30) 100 UNIT/ML injection Commonly known as:  NOVOLIN 70/30 Inject 10 units under the skin 2 (two) times a day with meals   lisinopril 40 MG tablet Commonly known as:  PRINIVIL,ZESTRIL Take 1 tablet (40 mg total) by mouth daily.   meloxicam 15  MG tablet Commonly known as:  MOBIC Take 1 tablet (15 mg total) by mouth daily.   nicotine 21 mg/24hr patch Commonly known as:  NICODERM CQ - dosed in mg/24 hours Place 1 patch (21 mg total) onto the skin daily. Start taking on:  09/12/2017          Total Time in preparing paper work, data evaluation and todays exam - 35 minutes  Auburn Bilberry M.D on 09/11/2017 at 2:06 [facility]  [facility]   Office  [phone]

## 2017-09-11 NOTE — Care Management Note (Addendum)
Case Management Note  Patient Details  Name: Huel E Sydnor Sr. MRN: [id number] Date of Birth: 08/28/1955 who                 Subjective/Objective:    Provided and explained a MATCH program coupon to Mr Redlich and provided him with a list of participating pharmacies. Provided Mr Kinlaw with a list of local clinics who serve the uninsured. Provided Mr Holcomb with a charity glucose monitor kit. Requested a charity Novolin 70/30 insulin 100units/ml vial from the [facility] pharmacy.  Discussed with Mr Engelbrecht the importance of his following up with either a local clinic, with [facility] or with the [facility] clinic because he needs regular health monitoring and refills on his medication. Mr Yeargan verbalized understanding and agreed to call one or more of the community resources provided to him.    Action/Plan:   Expected Discharge Date:  09/11/17               Expected Discharge Plan:     In-House Referral:     Discharge planning Services     Post Acute Care Choice:    Choice offered to:     DME Arranged:    DME Agency:     HH Arranged:    HH Agency:     Status of Service:     If discussed at Long Length of Stay Meetings, dates discussed:    Additional Comments:  , A, RN 09/11/2017, 2:20 PM  

## 2017-09-11 NOTE — Evaluation (Signed)
Occupational Therapy Evaluation Patient Details Name: Evan Townsend E Korson Sr. MRN: [id number] DOB: 01-06-1956 Today's Date: 09/11/2017    History of Present Illness presented to ER secondary to slurred speech, R UE weakness; admitted with acute CVA.  MRI significant for L thalamic infarct.   Clinical Impression   Upon evaluation pt up in chair and wife present- pt follow directions and show no cognitive issues or visual .  Pt strength grossly WFL in bilateral UE - but report decrease sensation in R hand now - per pt from wrist down into hand. But report he had issues in L hand too from stroke last year.  Pt can identify light touch except L thumb .  Pt show decrease dexterity , FMC , thumb <> fingers manipulation , and in hand manipulation to tips of digits - but bilateral. Pt and wife report pt had trouble with buttons, zips, cutting food, open packages or jars and cannot pick up some light like styrofoam - he crushes it or egg. Pt and wife was provided HEP but pt can benefit from outpt OT - some of the numbness from neuropathy being diabetic.     Follow Up Recommendations    can benefit from out pt OT   Equipment Recommendations   none    Recommendations for Other Services       Precautions / Restrictions Precautions Precautions: Fall Restrictions Weight Bearing Restrictions: No      Mobility Bed Mobility               General bed mobility comments: seated in recliner beginning/end of treatment session  Transfers Overall transfer level: Modified independent   Transfers: Sit to/from Stand Sit to Stand: Modified independent (Device/Increase time)         General transfer comment: good LE strenght/power, completing movement transitions with limited/no assist from UEs; good balance, stability noted    Balance Overall balance assessment: Modified Independent                                         ADL either performed or assessed with clinical  judgement   ADL                                               Vision         Perception     Praxis      Pertinent Vitals/Pain Pain Assessment: No/denies pain     Hand Dominance Right   Extremity/Trunk Assessment Upper Extremity Assessment Upper Extremity Assessment: (Pt  report decrease sensation from wrist down into hand on the R - and L from prior stroke last year - decrease proprioception decreasing his FMC , and dexterity )   Lower Extremity Assessment Lower Extremity Assessment: (baseline paresthesia bilat feet (neuropathy), otherwise strength grossly symmetrical and 5/5 throughout)       Communication Communication Communication: No difficulties(mild dysarthria, but intelligible; easily communicates needs and maintains conversation)   Cognition Arousal/Alertness: Awake/alert Behavior During Therapy: WFL for tasks assessed/performed Overall Cognitive Status: Within Functional Limits for tasks assessed                                     General  Comments       Exercises Other Exercises Other Exercises: Provide pt with HEP for desentitization and sensation reed - small objects out of rice bowl , rubb different texture , and proprioception HEP with vision compensation to reed  Other Exercises: THUMB <> fingers exercises , and in hand to tips manipulation exercises, opposition , and dynamic tripod grip  Other Exercises: to use it functionally    Shoulder Instructions      Home Living Family/patient expects to be discharged to:: Private residence Living Arrangements: Spouse/significant other Available Help at Discharge: Family;Available 24 hours/day Type of Home: Mobile home Home Access: Stairs to enter Entrance Stairs-Number of Steps: 3 Entrance Stairs-Rails: Right Home Layout: One level               Home Equipment: Cane - single point          Prior Functioning/Environment Level of Independence: Independent         Comments: Indep with ADLs, household and community distances; denies fall history.  Not currently working.        OT Problem List: Decreased coordination;Impaired UE functional use;Impaired sensation      OT Treatment/Interventions:      OT Goals(Current goals can be found in the care plan section) Acute Rehab OT Goals Patient Stated Goal: to return home OT Goal Formulation: With patient/family Time For Goal Achievement: 09/18/17 Potential to Achieve Goals: Good  OT Frequency:     Barriers to D/C:            Co-evaluation              AM-PAC PT "6 Clicks" Daily Activity     Outcome Measure                 End of Session    Activity Tolerance: Patient tolerated treatment well Patient left: in chair;with nursing/sitter in room(wife)  OT Visit Diagnosis: Other symptoms and signs involving the nervous system (R29.898)(finemotor , coordination , sensory reed )                Time: 0981-1914 OT Time Calculation (min): 24 min Charges:  OT General Charges $OT Visit: 1 Visit OT Evaluation $OT Eval Low Complexity: 1 Low G-Codes:       Lanasia Porras OTR/L,CLT 09/11/2017, 10:58 AM

## 2017-09-11 NOTE — Evaluation (Signed)
Physical Therapy Evaluation Patient Details Name: Evan MANSEL Sr. MRN: [id number] DOB: 12-Nov-1955 Today's Date: 09/11/2017   History of Present Illness  presented to ER secondary to slurred speech, R UE weakness; admitted with acute CVA.  MRI significant for L thalamic infarct.  Clinical Impression  Upon evaluation, patient alert and oriented; follows all commands and demonstrates good effort with all functional activities.  Endorses mild paresthesia R UE, elbow distally, but strength grossly symmetrical and WFL throughout bilat UE/LEs.  Demonstrates ability to complete sit/stand, basic transfers and gait (250') without assist device, mod indep; stairs x12 with single rail, mod indep.  Gait speed (10' walk time, 4-5 seconds) WFL with good fluidity, confidence and overall safety.  No buckling, LOB or safety concerns noted.  Did note mildly antalgic pattern at times due to posterior/lateral weakness in L hip, but patient reports baseline for him after previous stroke. Patient appears to be at baseline level of functional ability (verified by both patient and wife) without acute need for skilled PT intervention.  Did briefly review importance of continued/forced use of R UE to promote full return of coordination/functional use; patient/wife voiced understanding. Will complete initial order at this time; please re-consult should needs change.    Follow Up Recommendations No PT follow up    Equipment Recommendations       Recommendations for Other Services       Precautions / Restrictions Precautions Precautions: Fall Restrictions Weight Bearing Restrictions: No      Mobility  Bed Mobility               General bed mobility comments: seated in recliner beginning/end of treatment session  Transfers Overall transfer level: Modified independent   Transfers: Sit to/from Stand Sit to Stand: Modified independent (Device/Increase time)         General transfer comment: good LE  strenght/power, completing movement transitions with limited/no assist from UEs; good balance, stability noted  Ambulation/Gait Ambulation/Gait assistance: Modified independent (Device/Increase time) Ambulation Distance (Feet): 250 Feet Assistive device: None   Gait velocity: 10' walk time, 4-5 seconds   General Gait Details: mildly antalgic due to L posterior/lateral hip weakenss (patient reports as baseline); otherwise, good step height/length, good cadence/gait speed; no buckling, LOB or safety concers and able to complete all dynamic gait components (head turns, change of direction, start/stop) without difficulty or gait deviation  Stairs Stairs: Yes Stairs assistance: Modified independent (Device/Increase time) Stair Management: One rail Left Number of Stairs: 12 General stair comments: reciprocal stepping pattern with good hip/knee control; no LOB or safety concern  Wheelchair Mobility    Modified Rankin (Stroke Patients Only)       Balance Overall balance assessment: Modified Independent                                           Pertinent Vitals/Pain Pain Assessment: No/denies pain    Home Living Family/patient expects to be discharged to:: Private residence Living Arrangements: Spouse/significant other Available Help at Discharge: Family;Available 24 hours/day Type of Home: Mobile home Home Access: Stairs to enter Entrance Stairs-Rails: Right Entrance Stairs-Number of Steps: 3 Home Layout: One level Home Equipment: Cane - single point      Prior Function Level of Independence: Independent         Comments: Indep with ADLs, household and community distances; denies fall history.  Not currently working.  Hand Dominance   Dominant Hand: Right    Extremity/Trunk Assessment   Upper Extremity Assessment Upper Extremity Assessment: (reports mild paresthesia R elbow distally; otherwise, strength grossly 5/5 throughout)    Lower  Extremity Assessment Lower Extremity Assessment: (baseline paresthesia bilat feet (neuropathy), otherwise strength grossly symmetrical and 5/5 throughout)       Communication   Communication: No difficulties(mild dysarthria, but intelligible; easily communicates needs and maintains conversation)  Cognition Arousal/Alertness: Awake/alert Behavior During Therapy: WFL for tasks assessed/performed Overall Cognitive Status: Within Functional Limits for tasks assessed                                        General Comments      Exercises     Assessment/Plan    PT Assessment Patent does not need any further PT services  PT Problem List         PT Treatment Interventions      PT Goals (Current goals can be found in the Care Plan section)  Acute Rehab PT Goals Patient Stated Goal: to return home PT Goal Formulation: All assessment and education complete, DC therapy Time For Goal Achievement: 09/11/17 Potential to Achieve Goals: Good    Frequency     Barriers to discharge        Co-evaluation               AM-PAC PT "6 Clicks" Daily Activity  Outcome Measure Difficulty turning over in bed (including adjusting bedclothes, sheets and blankets)?: None Difficulty moving from lying on back to sitting on the side of the bed? : None Difficulty sitting down on and standing up from a chair with arms (e.g., wheelchair, bedside commode, etc,.)?: None Help needed moving to and from a bed to chair (including a wheelchair)?: None Help needed walking in hospital room?: None Help needed climbing 3-5 steps with a railing? : None 6 Click Score: 24    End of Session Equipment Utilized During Treatment: Gait belt Activity Tolerance: Patient tolerated treatment well Patient left: in chair;with call bell/phone within reach;with family/visitor present Nurse Communication: Mobility status PT Visit Diagnosis: Hemiplegia and hemiparesis;Difficulty in walking, not elsewhere  classified (R26.2) Hemiplegia - Right/Left: Right Hemiplegia - dominant/non-dominant: Dominant Hemiplegia - caused by: Cerebral infarction    Time: 4696-2952: 0916-0925 PT Time Calculation (min) (ACUTE ONLY): 9 min   Charges:   PT Evaluation $PT Eval Low Complexity: 1 Low     PT G Codes:        Deanta Mincey H. Manson PasseyBrown, PT, DPT, NCS 09/11/17, 9:35 AM [phone]

## 2017-09-11 NOTE — Progress Notes (Signed)
SLP Cancellation Note  Patient Details Name: Evan AnoDuncan E Lunney Sr. MRN: [id number] DOB: 21-Jul-1956   Cancelled treatment:       Reason Eval/Treat Not Completed: Patient at procedure or test/unavailable Reviewed chart. Pt is currently out of room for procedure. Will re-attempt later today or Monday as pt is available.   Lauree ChandlerMaaran Townsend, [place]MA, McKessonCCC-SLP  Speech-Language Pathologist   Jud,Twanda Stakes 09/11/2017, 9:43 AM

## 2017-09-11 NOTE — Progress Notes (Signed)
Pt D/C to home with wife. VSS. IV removed intact. Education on how to manage DM, how to draw up insulin, how to store insulin, and S/S of stroke were given to pt both verbally and in handout form given to both pt and wife. All questions answered.

## 2017-09-11 NOTE — Evaluation (Signed)
Speech Language Pathology Evaluation Patient Details Name: Evan AnoDuncan E Chestnutt Sr. MRN: [id number] DOB: 04/12/1956 Today's Date: 09/11/2017 Time: 1030-1100 SLP Time Calculation (min) (ACUTE ONLY): 30 min  Problem List:  Patient Active Problem List   Diagnosis Date Noted  . CVA (cerebral vascular accident) (HCC) 09/10/2017  . Hyperlipidemia 02/11/2017  . Acute CVA (cerebrovascular accident) (HCC) 11/10/2016  . Left sided numbness 11/10/2016  . Slurred speech 11/10/2016  . Essential hypertension 11/10/2016  . Diabetes (HCC) 11/10/2016   Past Medical History:  Past Medical History:  Diagnosis Date  . Diabetes 1.5, managed as type 2 (HCC)   . Diabetes mellitus without complication (HCC)   . Hypercholesterolemia   . Hypertension   . Pain in both feet   . Stroke [facility](HCC)    Past Surgical History: History reviewed. No pertinent surgical history. HPI:  Pt presented to ER secondary to slurred speech, R UE weakness; admitted with acute CVA.  MRI significant for L thalamic infarct.    Assessment / Plan / Recommendation Clinical Impression  Pt presents with mild-moderate dysarthria and mild cognitive linguistic deficits. Pt demonstrated decreased intelligibility at word, phrase, sentence and conversational levels d/t imprecise articulation. Intelligibility decreases in unknown context. Pt's decreased intelligibility impacts his ability to functionally communicate with others.  Pt also demonstrated recall deficits for personal information such as address and mild immediate recall of sentences, however pt was oriented x4. Oral motor exam revealed sensation and ROM WFL. No expressive or receptive aphasia observed. Pt would benefit from oupatient skilled ST for dysarthria and cognitive linguistic deficits to improve  functional communication.    SLP Assessment  SLP Recommendation/Assessment: Patient needs continued Speech Lanaguage Pathology Services SLP Visit Diagnosis: Dysarthria and anarthria  (R47.1);Cognitive communication deficit (R41.841)    Follow Up Recommendations  Outpatient SLP    Frequency and Duration min 2x/week  1 week      SLP Evaluation Cognition  Overall Cognitive Status: Impaired/Different from baseline Arousal/Alertness: Awake/alert Orientation Level: Oriented X4 Memory: Impaired Memory Impairment: Other (comment)(Pt unable to recall address. ) Awareness: Appears intact Problem Solving: (unable to assess)       Comprehension  Auditory Comprehension Overall Auditory Comprehension: Appears within functional limits for tasks assessed Yes/No Questions: Within Functional Limits Commands: Within Functional Limits(Pt with difficulty with the commands to "point WITH the pen to the paper." ) Conversation: Simple Visual Recognition/Discrimination Discrimination: Not tested Reading Comprehension Reading Status: Not tested    Expression Expression Primary Mode of Expression: Verbal Verbal Expression Overall Verbal Expression: Appears within functional limits for tasks assessed Initiation: No impairment Automatic Speech: Social Response Level of Generative/Spontaneous Verbalization: Conversation Repetition: No impairment Naming: No impairment Pragmatics: Unable to assess Written Expression Dominant Hand: Right Written Expression: Not tested   Oral / Motor  Oral Motor/Sensory Function Overall Oral Motor/Sensory Function: Within functional limits Motor Speech Overall Motor Speech: Impaired Respiration: Within functional limits Phonation: Normal Resonance: Within functional limits Articulation: Impaired Level of Impairment: Phrase Intelligibility: Intelligibility reduced Word: 25-49% accurate Phrase: 25-49% accurate Sentence: 0-24% accurate Conversation: 25-49% accurate Motor Planning: Witnin functional limits Motor Speech Errors: Consistent Interfering Components: Inadequate dentition Effective Techniques: Over-articulate;Increased vocal  intensity   GO                   Lauree ChandlerMaaran [place], MA, [employer]CCC-SLP  Speech-Language Pathologist  [place],Evan Townsend 09/11/2017, 11:51 AM

## 2017-09-13 NOTE — Care Management (Signed)
Post discharge note: CM department has filled Novolog 70/30 through CM/CSW funds provided to [facility]patient [facility] along with [facility] assistance. Patient has a long history of not following through with resources given for long term care. FYI shows multiple missed appointments at [facility].

## 2017-09-30 ENCOUNTER — Ambulatory Visit: Payer: Self-pay

## 2017-10-04 ENCOUNTER — Emergency Department
Admission: EM | Admit: 2017-10-04 | Discharge: 2017-10-04 | Disposition: A | Discharge status: Home / Self Care | Payer: No Typology Code available for payment source | Attending: Emergency Medicine | Admitting: Emergency Medicine

## 2017-10-04 ENCOUNTER — Emergency Department: Payer: No Typology Code available for payment source

## 2017-10-04 ENCOUNTER — Other Ambulatory Visit: Payer: Self-pay

## 2017-10-04 ENCOUNTER — Encounter: Payer: Self-pay | Admitting: Emergency Medicine

## 2017-10-04 DIAGNOSIS — F17210 Nicotine dependence, cigarettes, uncomplicated: Secondary | ICD-10-CM | POA: Diagnosis not present

## 2017-10-04 DIAGNOSIS — E119 Type 2 diabetes mellitus without complications: Secondary | ICD-10-CM | POA: Insufficient documentation

## 2017-10-04 DIAGNOSIS — Y939 Activity, unspecified: Secondary | ICD-10-CM | POA: Diagnosis not present

## 2017-10-04 DIAGNOSIS — Z7901 Long term (current) use of anticoagulants: Secondary | ICD-10-CM | POA: Insufficient documentation

## 2017-10-04 DIAGNOSIS — S8012XA Contusion of left lower leg, initial encounter: Secondary | ICD-10-CM | POA: Insufficient documentation

## 2017-10-04 DIAGNOSIS — Z7982 Long term (current) use of aspirin: Secondary | ICD-10-CM | POA: Insufficient documentation

## 2017-10-04 DIAGNOSIS — Z8673 Personal history of transient ischemic attack (TIA), and cerebral infarction without residual deficits: Secondary | ICD-10-CM | POA: Insufficient documentation

## 2017-10-04 DIAGNOSIS — Z79899 Other long term (current) drug therapy: Secondary | ICD-10-CM | POA: Insufficient documentation

## 2017-10-04 DIAGNOSIS — S8992XA Unspecified injury of left lower leg, initial encounter: Secondary | ICD-10-CM | POA: Diagnosis present

## 2017-10-04 DIAGNOSIS — Y92410 Unspecified street and highway as the place of occurrence of the external cause: Secondary | ICD-10-CM | POA: Insufficient documentation

## 2017-10-04 DIAGNOSIS — I10 Essential (primary) hypertension: Secondary | ICD-10-CM | POA: Insufficient documentation

## 2017-10-04 DIAGNOSIS — Y999 Unspecified external cause status: Secondary | ICD-10-CM | POA: Diagnosis not present

## 2017-10-04 MED ORDER — HYDROCODONE-ACETAMINOPHEN 5-325 MG PO TABS
1.0000 | ORAL_TABLET | Freq: Once | ORAL | Status: AC
  Administered 2017-10-04: 1 via ORAL
  Filled 2017-10-04: qty 1

## 2017-10-04 MED ORDER — HYDROCODONE-ACETAMINOPHEN 5-325 MG PO TABS: 1.0000 | ORAL_TABLET | Freq: Four times a day (QID) | ORAL | 0 refills | Status: DC | PRN
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
## 2017-10-04 NOTE — Discharge Instructions (Signed)
Make an appointment with your doctor if any continued problems.  Ice and elevate your leg as needed for pain and swelling Take Norco as needed for pain.  Take only as directed.  Follow up with your doctor for recheck of your blood pressure and if any continued pain medication is needed.
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
## 2017-10-04 NOTE — ED Notes (Signed)
Pt states he is pretty sure he took his BP meds this morning with his other meds.

## 2017-10-06 ENCOUNTER — Encounter: Payer: Self-pay | Admitting: Emergency Medicine

## 2017-10-06 ENCOUNTER — Emergency Department: Payer: No Typology Code available for payment source

## 2017-10-06 ENCOUNTER — Other Ambulatory Visit: Payer: Self-pay

## 2017-10-06 ENCOUNTER — Emergency Department
Admission: EM | Admit: 2017-10-06 | Discharge: 2017-10-06 | Disposition: A | Discharge status: Home / Self Care | Payer: No Typology Code available for payment source | Attending: Emergency Medicine | Admitting: Emergency Medicine

## 2017-10-06 DIAGNOSIS — F17210 Nicotine dependence, cigarettes, uncomplicated: Secondary | ICD-10-CM | POA: Insufficient documentation

## 2017-10-06 DIAGNOSIS — E7800 Pure hypercholesterolemia, unspecified: Secondary | ICD-10-CM | POA: Insufficient documentation

## 2017-10-06 DIAGNOSIS — Z8673 Personal history of transient ischemic attack (TIA), and cerebral infarction without residual deficits: Secondary | ICD-10-CM | POA: Diagnosis not present

## 2017-10-06 DIAGNOSIS — Z794 Long term (current) use of insulin: Secondary | ICD-10-CM | POA: Diagnosis not present

## 2017-10-06 DIAGNOSIS — Z79899 Other long term (current) drug therapy: Secondary | ICD-10-CM | POA: Insufficient documentation

## 2017-10-06 DIAGNOSIS — M25552 Pain in left hip: Secondary | ICD-10-CM | POA: Insufficient documentation

## 2017-10-06 DIAGNOSIS — Z7902 Long term (current) use of antithrombotics/antiplatelets: Secondary | ICD-10-CM | POA: Diagnosis not present

## 2017-10-06 DIAGNOSIS — I10 Essential (primary) hypertension: Secondary | ICD-10-CM | POA: Insufficient documentation

## 2017-10-06 DIAGNOSIS — E785 Hyperlipidemia, unspecified: Secondary | ICD-10-CM | POA: Insufficient documentation

## 2017-10-06 DIAGNOSIS — M7918 Myalgia, other site: Secondary | ICD-10-CM

## 2017-10-06 DIAGNOSIS — E119 Type 2 diabetes mellitus without complications: Secondary | ICD-10-CM | POA: Insufficient documentation

## 2017-10-06 DIAGNOSIS — Z7982 Long term (current) use of aspirin: Secondary | ICD-10-CM | POA: Diagnosis not present

## 2017-10-06 MED ORDER — OXYCODONE-ACETAMINOPHEN 5-325 MG PO TABS
1.0000 | ORAL_TABLET | Freq: Once | ORAL | Status: AC
  Administered 2017-10-06: 1 via ORAL
  Filled 2017-10-06: qty 1

## 2017-10-06 MED ORDER — IBUPROFEN 600 MG PO TABS
600.0000 mg | ORAL_TABLET | Freq: Once | ORAL | Status: AC
  Administered 2017-10-06: 600 mg via ORAL
  Filled 2017-10-06: qty 1
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
## 2017-10-17 ENCOUNTER — Other Ambulatory Visit: Payer: Self-pay

## 2017-10-17 ENCOUNTER — Encounter: Payer: Self-pay | Admitting: Emergency Medicine

## 2017-10-17 ENCOUNTER — Emergency Department
Admission: EM | Admit: 2017-10-17 | Discharge: 2017-10-17 | Disposition: A | Discharge status: Home / Self Care | Payer: No Typology Code available for payment source | Attending: Emergency Medicine | Admitting: Emergency Medicine

## 2017-10-17 DIAGNOSIS — Z79899 Other long term (current) drug therapy: Secondary | ICD-10-CM | POA: Diagnosis not present

## 2017-10-17 DIAGNOSIS — I10 Essential (primary) hypertension: Secondary | ICD-10-CM | POA: Diagnosis not present

## 2017-10-17 DIAGNOSIS — Z7982 Long term (current) use of aspirin: Secondary | ICD-10-CM | POA: Insufficient documentation

## 2017-10-17 DIAGNOSIS — M5416 Radiculopathy, lumbar region: Secondary | ICD-10-CM | POA: Diagnosis not present

## 2017-10-17 DIAGNOSIS — E119 Type 2 diabetes mellitus without complications: Secondary | ICD-10-CM | POA: Diagnosis not present

## 2017-10-17 DIAGNOSIS — Z794 Long term (current) use of insulin: Secondary | ICD-10-CM | POA: Diagnosis not present

## 2017-10-17 DIAGNOSIS — F17210 Nicotine dependence, cigarettes, uncomplicated: Secondary | ICD-10-CM | POA: Diagnosis not present

## 2017-10-17 DIAGNOSIS — Z7902 Long term (current) use of antithrombotics/antiplatelets: Secondary | ICD-10-CM | POA: Insufficient documentation

## 2017-10-17 DIAGNOSIS — M79652 Pain in left thigh: Secondary | ICD-10-CM | POA: Diagnosis present

## 2017-10-17 MED ORDER — PREDNISONE 10 MG PO TABS: 10.0000 mg | ORAL_TABLET | Freq: Every day | ORAL | 0 refills | Status: DC

## 2017-10-17 MED ORDER — TRAMADOL HCL 50 MG PO TABS: 50.0000 mg | ORAL_TABLET | Freq: Four times a day (QID) | ORAL | 0 refills | Status: DC | PRN

## 2017-10-17 MED ORDER — GABAPENTIN 300 MG PO CAPS: 300.0000 mg | ORAL_CAPSULE | Freq: Every day | ORAL | 0 refills | Status: DC
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
## 2017-10-17 NOTE — Discharge Instructions (Signed)
Please take tramadol, gabapentin and prednisone as prescribed.  Follow-up with orthopedics if no improvement in 1 week.  Return to the ER for any worsening symptoms or urgent changes in health.
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
## 2017-10-22 ENCOUNTER — Telehealth: Payer: Self-pay | Admitting: Pharmacy Technician

## 2017-10-22 NOTE — Telephone Encounter (Signed)
Patient failed to provide 2019 financial documentation.  No additional medication assistance will be provided by [facility] without the required proof of income documentation.  Patient notified by letter.  Shandell Jallow J. Sophonie Goforth Care Manager [facility] 

## 2017-12-14 ENCOUNTER — Other Ambulatory Visit: Payer: Self-pay | Admitting: Adult Health Nurse Practitioner

## 2017-12-14 DIAGNOSIS — E119 Type 2 diabetes mellitus without complications: Secondary | ICD-10-CM

## 2018-01-13 ENCOUNTER — Ambulatory Visit: Payer: Medicaid Other | Admitting: Adult Health Nurse Practitioner

## 2018-01-13 VITALS — BP 143/90 | HR 96 | Temp 97.7°F | Ht 66.0 in | Wt 144.1 lb

## 2018-01-13 DIAGNOSIS — Z794 Long term (current) use of insulin: Secondary | ICD-10-CM

## 2018-01-13 DIAGNOSIS — E119 Type 2 diabetes mellitus without complications: Secondary | ICD-10-CM

## 2018-01-13 DIAGNOSIS — I10 Essential (primary) hypertension: Secondary | ICD-10-CM

## 2018-01-13 DIAGNOSIS — M79605 Pain in left leg: Secondary | ICD-10-CM | POA: Insufficient documentation

## 2018-01-13 DIAGNOSIS — E785 Hyperlipidemia, unspecified: Secondary | ICD-10-CM

## 2018-01-13 MED ORDER — GABAPENTIN 300 MG PO CAPS: 600.0000 mg | ORAL_CAPSULE | Freq: Three times a day (TID) | ORAL | 2 refills | Status: DC

## 2018-01-13 MED ORDER — INSULIN ASPART PROT & ASPART (70-30 MIX) 100 UNIT/ML ~~LOC~~ SUSP: 20.0000 [IU] | Freq: Two times a day (BID) | SUBCUTANEOUS | 11 refills | Status: DC

## 2018-01-13 MED ORDER — GLIPIZIDE 10 MG PO TABS: 10.0000 mg | ORAL_TABLET | Freq: Every day | ORAL | 3 refills | Status: DC

## 2018-01-13 NOTE — Progress Notes (Signed)
Patient: Evan RODGER Sr. Male    DOB: 1955/12/23   62 y.o.   MRN: [id number] Visit Date: 01/13/2018  Today's Provider: Jacelyn Pi, NP   Chief Complaint  Patient presents with  . Leg Pain    L leg from knee down. Pain began 44mo ago   Subjective:    HPI   Pt states that his left leg is hurting and it is hindering him from sleeping at night. Has taken advil with no relief. States it hurts all the time- throbbing pain rated 10/10.   Has been seen in the ED for this pain- Xrays of femur and tib-fib negative. He was given gabapentin with minimal relief of the leg pain- states it did help his feet.  Pt also had a CVA that affected the left side.    DM:  Last A1c was  13.5 in Jan. Takes 70/30 15 units BID- not checking sugars at home.  Not taking glipizide .       No Known Allergies Previous Medications   ASPIRIN 81 MG EC TABLET    Take 1 tablet (81 mg total) by mouth daily.   ATORVASTATIN (LIPITOR) 40 MG TABLET    TAKE ONE TABLET EVERY EVENING   CLOPIDOGREL (PLAVIX) 75 MG TABLET    Take 1 tablet (75 mg total) by mouth daily.   GABAPENTIN (NEURONTIN) 300 MG CAPSULE    Take 1 capsule (300 mg total) by mouth at bedtime.   GLIPIZIDE (GLUCOTROL) 5 MG TABLET    Take 1 tablet (5 mg total) by mouth daily before breakfast.   GLUCOSE BLOOD (FREESTYLE INSULINX TEST) TEST STRIP    Use as instructed   HYDROCODONE-ACETAMINOPHEN (NORCO/VICODIN) 5-325 MG TABLET    Take 1 tablet by mouth every 6 (six) hours as needed for moderate pain.   INSULIN ASPART PROTAMINE- ASPART (NOVOLOG MIX 70/30) (70-30) 100 UNIT/ML INJECTION    Inject 0.15 mLs (15 Units total) into the skin 2 (two) times daily with a meal.   LANCETS (FREESTYLE) LANCETS    Use for glucose checks four times daily   LISINOPRIL (PRINIVIL,ZESTRIL) 40 MG TABLET    Take 1 tablet (40 mg total) by mouth daily.   MELOXICAM (MOBIC) 15 MG TABLET    Take 1 tablet (15 mg total) by mouth daily.   PREDNISONE (DELTASONE) 10 MG TABLET    Take  1 tablet (10 mg total) by mouth daily. 6,5,4,3,2,1 six day taper   TRAMADOL (ULTRAM) 50 MG TABLET    Take 1 tablet (50 mg total) by mouth every 6 (six) hours as needed.   TRUEPLUS PEN NEEDLES 31G X 8 MM MISC    USE AS DIRECTED    Review of Systems  All other systems reviewed and are negative.   Social History   Tobacco Use  . Smoking status: Current Every Day Smoker    Packs/day: 0.25    Types: Cigarettes    Start date: 10  . Smokeless tobacco: Never Used  Substance Use Topics  . Alcohol use: Yes    Alcohol/week: 0.6 oz    Types: 1 Cans of beer per week   Objective:   BP (!) 143/90   Pulse 96   Temp 97.7 F (36.5 C)   Ht  (1.676 m)   Wt 144 lb 1.6 oz (65.4 kg)   BMI 23.26 kg/m   Physical Exam  Constitutional: He appears well-developed and well-nourished.  Cardiovascular: Normal rate and normal heart sounds.  Pulmonary/Chest: Effort normal and  breath sounds normal.  Abdominal: Soft. Bowel sounds are normal.  Musculoskeletal:  Left leg with decreased sensation due to CVA 4/5 strength, no edema or open areas.         Assessment & Plan:         FU in 4 weeks for lab week review.   DM:  Not controlled.  Encourage diabetic diet and exercise.  Increase 70/30 to 20 units BID.  Increase glipizide to  daily.  Check CBGs BID- keep log and bring to OV.    Left leg pain:  ? Secondary to CVA.  Increase Gabapentin to  TID. Will check B12 and folate.      Discussed importance of compliance with medications and appointments.   FU in 1 week for lab appointments.     Jacelyn Pi, NP   [facility]

## 2018-01-13 NOTE — Patient Instructions (Signed)
FU in 4 weeks for lab week review.   DM:  Not controlled.  Encourage diabetic diet and exercise.  Increase 70/30 to 20 units BID.  Increase glipizide to  daily.  Check CBGs BID- keep log and bring to OV.    Left leg pain:  ? Secondary to CVA.  Increase Gabapentin to  TID. Will check B12 and folate.      Discussed importance of compliance with medications and appointments.   FU in 1 week for lab appointments.

## 2018-01-20 ENCOUNTER — Other Ambulatory Visit: Payer: Medicaid Other

## 2018-01-27 ENCOUNTER — Other Ambulatory Visit: Payer: Self-pay

## 2018-01-27 ENCOUNTER — Other Ambulatory Visit: Payer: Medicaid Other

## 2018-01-27 DIAGNOSIS — E119 Type 2 diabetes mellitus without complications: Secondary | ICD-10-CM

## 2018-01-27 MED ORDER — GLIPIZIDE 10 MG PO TABS: 10.0000 mg | ORAL_TABLET | Freq: Every day | ORAL | 3 refills | Status: DC

## 2018-01-27 MED ORDER — GABAPENTIN 300 MG PO CAPS: 600.0000 mg | ORAL_CAPSULE | Freq: Three times a day (TID) | ORAL | 2 refills | Status: DC

## 2018-01-27 MED ORDER — INSULIN ASPART PROT & ASPART (70-30 MIX) 100 UNIT/ML ~~LOC~~ SUSP: 20.0000 [IU] | Freq: Two times a day (BID) | SUBCUTANEOUS | 11 refills | Status: DC

## 2018-01-27 NOTE — Progress Notes (Signed)
Pt states pharmacy did not have medication. [facility]

## 2018-02-01 ENCOUNTER — Other Ambulatory Visit: Payer: Medicaid Other

## 2018-02-10 ENCOUNTER — Ambulatory Visit: Payer: Medicaid Other

## 2018-02-10 DIAGNOSIS — E119 Type 2 diabetes mellitus without complications: Secondary | ICD-10-CM
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
## 2018-02-11 LAB — COMPREHENSIVE METABOLIC PANEL
ALT: 31 IU/L (ref 0–44)
AST: 16 IU/L (ref 0–40)
Albumin/Globulin Ratio: 1.8 (ref 1.2–2.2)
Albumin: 4.3 g/dL (ref 3.6–4.8)
Alkaline Phosphatase: 116 IU/L (ref 39–117)
BUN/Creatinine Ratio: 16 (ref 10–24)
BUN: 16 mg/dL (ref 8–27)
Bilirubin Total: 0.2 mg/dL (ref 0.0–1.2)
CO2: 23 mmol/L (ref 20–29)
Calcium: 9.4 mg/dL (ref 8.6–10.2)
Chloride: 107 mmol/L — ABNORMAL HIGH (ref 96–106)
Creatinine, Ser: 1.03 mg/dL (ref 0.76–1.27)
GFR calc Af Amer: 91 mL/min/{1.73_m2} (ref >59)
GFR calc non Af Amer: 79 mL/min/{1.73_m2} (ref >59)
Globulin, Total: 2.4 g/dL (ref 1.5–4.5)
Glucose: 223 mg/dL — ABNORMAL HIGH (ref 65–99)
Potassium: 3.8 mmol/L (ref 3.5–5.2)
Sodium: 144 mmol/L (ref 134–144)
Total Protein: 6.7 g/dL (ref 6.0–8.5)

## 2018-02-11 LAB — LIPID PANEL
Chol/HDL Ratio: 2.5 ratio (ref 0.0–5.0)
Cholesterol, Total: 161 mg/dL (ref 100–199)
HDL: 64 mg/dL (ref >39)
LDL Calculated: 54 mg/dL (ref 0–99)
Triglycerides: 213 mg/dL — ABNORMAL HIGH (ref 0–149)
VLDL Cholesterol Cal: 43 mg/dL — ABNORMAL HIGH (ref 5–40)

## 2018-02-11 LAB — B12 AND FOLATE PANEL
Folate: 10.5 ng/mL (ref >3.0)
Vitamin B-12: 491 pg/mL (ref 232–1245)

## 2018-02-11 LAB — HEMOGLOBIN A1C
Est. average glucose Bld gHb Est-mCnc: 249 mg/dL
Hgb A1c MFr Bld: 10.3 % — ABNORMAL HIGH (ref 4.8–5.6)

## 2018-02-11 LAB — TSH: TSH: 0.417 u[IU]/mL — ABNORMAL LOW (ref 0.450–4.500)

## 2018-02-15 ENCOUNTER — Ambulatory Visit: Payer: Medicaid Other

## 2018-03-08 ENCOUNTER — Other Ambulatory Visit: Payer: Self-pay

## 2018-03-08 DIAGNOSIS — E119 Type 2 diabetes mellitus without complications: Secondary | ICD-10-CM

## 2018-03-08 MED ORDER — GLIPIZIDE 10 MG PO TABS
10.0000 mg | ORAL_TABLET | Freq: Every day | ORAL | 3 refills | Status: DC
Start: 2018-03-08 — End: 2018-09-23

## 2018-03-08 MED ORDER — INSULIN PEN NEEDLE 31G X 8 MM MISC: 1 refills | Status: DC

## 2018-03-08 MED ORDER — ATORVASTATIN CALCIUM 40 MG PO TABS: 40.0000 mg | ORAL_TABLET | Freq: Every evening | ORAL | 0 refills | Status: DC

## 2018-03-08 MED ORDER — INSULIN ASPART PROT & ASPART (70-30 MIX) 100 UNIT/ML ~~LOC~~ SUSP: 20.0000 [IU] | Freq: Two times a day (BID) | SUBCUTANEOUS | 11 refills | Status: DC

## 2018-03-08 MED ORDER — GABAPENTIN 300 MG PO CAPS: 600.0000 mg | ORAL_CAPSULE | Freq: Three times a day (TID) | ORAL | 2 refills | Status: DC

## 2018-03-08 NOTE — Progress Notes (Signed)
Pt [facility] 

## 2018-03-15 ENCOUNTER — Ambulatory Visit: Payer: Medicaid Other

## 2018-05-05 DIAGNOSIS — E1165 Type 2 diabetes mellitus with hyperglycemia: Secondary | ICD-10-CM | POA: Diagnosis not present

## 2018-05-05 DIAGNOSIS — Z6824 Body mass index (BMI) 24.0-24.9, adult: Secondary | ICD-10-CM | POA: Diagnosis not present

## 2018-05-05 DIAGNOSIS — G6289 Other specified polyneuropathies: Secondary | ICD-10-CM | POA: Diagnosis not present

## 2018-05-05 DIAGNOSIS — I10 Essential (primary) hypertension: Secondary | ICD-10-CM | POA: Diagnosis not present

## 2018-05-05 DIAGNOSIS — Z794 Long term (current) use of insulin: Secondary | ICD-10-CM | POA: Diagnosis not present

## 2018-05-05 DIAGNOSIS — Z8673 Personal history of transient ischemic attack (TIA), and cerebral infarction without residual deficits: Secondary | ICD-10-CM | POA: Diagnosis not present

## 2018-05-05 DIAGNOSIS — E1142 Type 2 diabetes mellitus with diabetic polyneuropathy: Secondary | ICD-10-CM | POA: Diagnosis not present

## 2018-05-05 DIAGNOSIS — E119 Type 2 diabetes mellitus without complications: Secondary | ICD-10-CM | POA: Diagnosis not present

## 2018-06-08 DIAGNOSIS — I10 Essential (primary) hypertension: Secondary | ICD-10-CM | POA: Diagnosis not present

## 2018-06-08 DIAGNOSIS — E1165 Type 2 diabetes mellitus with hyperglycemia: Secondary | ICD-10-CM | POA: Diagnosis not present

## 2018-06-08 DIAGNOSIS — Z794 Long term (current) use of insulin: Secondary | ICD-10-CM | POA: Diagnosis not present

## 2018-06-08 DIAGNOSIS — Z6825 Body mass index (BMI) 25.0-25.9, adult: Secondary | ICD-10-CM | POA: Diagnosis not present

## 2018-06-09 ENCOUNTER — Ambulatory Visit: Payer: Self-pay

## 2018-06-09 ENCOUNTER — Telehealth: Payer: Self-pay | Admitting: Pharmacy Technician

## 2018-06-09 NOTE — Telephone Encounter (Signed)
Patient has Medicaid.  No longer meets [facility]'s eligibility requirements.  Patient notified.  [name] Care Manager [facility]

## 2018-06-22 DIAGNOSIS — Z794 Long term (current) use of insulin: Secondary | ICD-10-CM | POA: Diagnosis not present

## 2018-06-22 DIAGNOSIS — I10 Essential (primary) hypertension: Secondary | ICD-10-CM | POA: Diagnosis not present

## 2018-06-22 DIAGNOSIS — E1165 Type 2 diabetes mellitus with hyperglycemia: Secondary | ICD-10-CM | POA: Diagnosis not present

## 2018-07-25 ENCOUNTER — Other Ambulatory Visit: Payer: Self-pay | Admitting: Adult Health Nurse Practitioner

## 2018-07-25 DIAGNOSIS — E119 Type 2 diabetes mellitus without complications: Secondary | ICD-10-CM

## 2018-07-29 DIAGNOSIS — Z7984 Long term (current) use of oral hypoglycemic drugs: Secondary | ICD-10-CM | POA: Diagnosis not present

## 2018-07-29 DIAGNOSIS — Z794 Long term (current) use of insulin: Secondary | ICD-10-CM | POA: Diagnosis not present

## 2018-07-29 DIAGNOSIS — L602 Onychogryphosis: Secondary | ICD-10-CM | POA: Diagnosis not present

## 2018-07-29 DIAGNOSIS — I10 Essential (primary) hypertension: Secondary | ICD-10-CM | POA: Diagnosis not present

## 2018-07-29 DIAGNOSIS — E1165 Type 2 diabetes mellitus with hyperglycemia: Secondary | ICD-10-CM | POA: Diagnosis not present

## 2018-07-29 DIAGNOSIS — Z6825 Body mass index (BMI) 25.0-25.9, adult: Secondary | ICD-10-CM | POA: Diagnosis not present

## 2018-07-29 DIAGNOSIS — R809 Proteinuria, unspecified: Secondary | ICD-10-CM | POA: Diagnosis not present

## 2018-08-24 DIAGNOSIS — E119 Type 2 diabetes mellitus without complications: Secondary | ICD-10-CM | POA: Diagnosis not present

## 2018-09-01 ENCOUNTER — Inpatient Hospital Stay
Admission: EM | Admit: 2018-09-01 | Discharge: 2018-09-06 | DRG: 064 | Disposition: A | Discharge status: Other Acute Inpatient Hospital | Payer: Medicaid Other | Attending: Internal Medicine | Admitting: Internal Medicine

## 2018-09-01 ENCOUNTER — Observation Stay: Payer: Medicaid Other

## 2018-09-01 ENCOUNTER — Encounter: Payer: Self-pay | Admitting: Emergency Medicine

## 2018-09-01 ENCOUNTER — Observation Stay (HOSPITAL_COMMUNITY): Payer: No Typology Code available for payment source

## 2018-09-01 ENCOUNTER — Emergency Department: Payer: Medicaid Other

## 2018-09-01 ENCOUNTER — Other Ambulatory Visit: Payer: Self-pay

## 2018-09-01 DIAGNOSIS — I10 Essential (primary) hypertension: Secondary | ICD-10-CM | POA: Diagnosis not present

## 2018-09-01 DIAGNOSIS — Z79891 Long term (current) use of opiate analgesic: Secondary | ICD-10-CM

## 2018-09-01 DIAGNOSIS — I639 Cerebral infarction, unspecified: Principal | ICD-10-CM | POA: Diagnosis present

## 2018-09-01 DIAGNOSIS — E860 Dehydration: Secondary | ICD-10-CM | POA: Diagnosis present

## 2018-09-01 DIAGNOSIS — E1151 Type 2 diabetes mellitus with diabetic peripheral angiopathy without gangrene: Secondary | ICD-10-CM | POA: Diagnosis present

## 2018-09-01 DIAGNOSIS — N170 Acute kidney failure with tubular necrosis: Secondary | ICD-10-CM | POA: Diagnosis present

## 2018-09-01 DIAGNOSIS — E785 Hyperlipidemia, unspecified: Secondary | ICD-10-CM | POA: Diagnosis present

## 2018-09-01 DIAGNOSIS — R531 Weakness: Secondary | ICD-10-CM | POA: Diagnosis not present

## 2018-09-01 DIAGNOSIS — Z833 Family history of diabetes mellitus: Secondary | ICD-10-CM

## 2018-09-01 DIAGNOSIS — Z79899 Other long term (current) drug therapy: Secondary | ICD-10-CM

## 2018-09-01 DIAGNOSIS — Z7902 Long term (current) use of antithrombotics/antiplatelets: Secondary | ICD-10-CM

## 2018-09-01 DIAGNOSIS — R29818 Other symptoms and signs involving the nervous system: Secondary | ICD-10-CM | POA: Diagnosis not present

## 2018-09-01 DIAGNOSIS — E1165 Type 2 diabetes mellitus with hyperglycemia: Secondary | ICD-10-CM | POA: Diagnosis present

## 2018-09-01 DIAGNOSIS — Z716 Tobacco abuse counseling: Secondary | ICD-10-CM | POA: Diagnosis not present

## 2018-09-01 DIAGNOSIS — E1142 Type 2 diabetes mellitus with diabetic polyneuropathy: Secondary | ICD-10-CM | POA: Diagnosis present

## 2018-09-01 DIAGNOSIS — G459 Transient cerebral ischemic attack, unspecified: Secondary | ICD-10-CM | POA: Diagnosis present

## 2018-09-01 DIAGNOSIS — R1310 Dysphagia, unspecified: Secondary | ICD-10-CM | POA: Diagnosis present

## 2018-09-01 DIAGNOSIS — R29700 NIHSS score 0: Secondary | ICD-10-CM | POA: Diagnosis present

## 2018-09-01 DIAGNOSIS — I6521 Occlusion and stenosis of right carotid artery: Secondary | ICD-10-CM | POA: Diagnosis not present

## 2018-09-01 DIAGNOSIS — Z791 Long term (current) use of non-steroidal anti-inflammatories (NSAID): Secondary | ICD-10-CM

## 2018-09-01 DIAGNOSIS — Z7982 Long term (current) use of aspirin: Secondary | ICD-10-CM

## 2018-09-01 DIAGNOSIS — R29810 Facial weakness: Secondary | ICD-10-CM | POA: Diagnosis present

## 2018-09-01 DIAGNOSIS — R471 Dysarthria and anarthria: Secondary | ICD-10-CM | POA: Diagnosis present

## 2018-09-01 DIAGNOSIS — Z7952 Long term (current) use of systemic steroids: Secondary | ICD-10-CM

## 2018-09-01 DIAGNOSIS — Z794 Long term (current) use of insulin: Secondary | ICD-10-CM

## 2018-09-01 DIAGNOSIS — E7800 Pure hypercholesterolemia, unspecified: Secondary | ICD-10-CM | POA: Diagnosis present

## 2018-09-01 DIAGNOSIS — E119 Type 2 diabetes mellitus without complications: Secondary | ICD-10-CM | POA: Diagnosis not present

## 2018-09-01 DIAGNOSIS — R001 Bradycardia, unspecified: Secondary | ICD-10-CM | POA: Diagnosis present

## 2018-09-01 DIAGNOSIS — F17210 Nicotine dependence, cigarettes, uncomplicated: Secondary | ICD-10-CM | POA: Diagnosis present

## 2018-09-01 DIAGNOSIS — G8194 Hemiplegia, unspecified affecting left nondominant side: Secondary | ICD-10-CM | POA: Diagnosis present

## 2018-09-01 DIAGNOSIS — I672 Cerebral atherosclerosis: Secondary | ICD-10-CM | POA: Diagnosis present

## 2018-09-01 DIAGNOSIS — Z66 Do not resuscitate: Secondary | ICD-10-CM | POA: Diagnosis not present

## 2018-09-01 LAB — GLUCOSE, CAPILLARY
Glucose-Capillary: 329 mg/dL — ABNORMAL HIGH (ref 70–99)
Glucose-Capillary: 60 mg/dL — ABNORMAL LOW (ref 70–99)
Glucose-Capillary: 98 mg/dL (ref 70–99)
Glucose-Capillary: 232 mg/dL — ABNORMAL HIGH (ref 70–99)

## 2018-09-01 LAB — DIFFERENTIAL
Abs Immature Granulocytes: 0.02 10*3/uL (ref 0.00–0.07)
Basophils Absolute: 0 10*3/uL (ref 0.0–0.1)
Basophils Relative: 1 %
Eosinophils Absolute: 0.1 10*3/uL (ref 0.0–0.5)
Eosinophils Relative: 2 %
Immature Granulocytes: 0 %
Lymphocytes Relative: 33 %
Lymphs Abs: 2 10*3/uL (ref 0.7–4.0)
Monocytes Absolute: 0.5 10*3/uL (ref 0.1–1.0)
Monocytes Relative: 8 %
Neutro Abs: 3.3 10*3/uL (ref 1.7–7.7)
Neutrophils Relative %: 56 %

## 2018-09-01 LAB — CBC
HCT: 35.3 % — ABNORMAL LOW (ref 39.0–52.0)
Hemoglobin: 11.8 g/dL — ABNORMAL LOW (ref 13.0–17.0)
MCH: 29.5 pg (ref 26.0–34.0)
MCHC: 33.4 g/dL (ref 30.0–36.0)
MCV: 88.3 fL (ref 80.0–100.0)
Platelets: 241 10*3/uL (ref 150–400)
RBC: 4 MIL/uL — ABNORMAL LOW (ref 4.22–5.81)
RDW: 14.1 % (ref 11.5–15.5)
WBC: 5.9 10*3/uL (ref 4.0–10.5)
nRBC: 0 % (ref 0.0–0.2)

## 2018-09-01 LAB — COMPREHENSIVE METABOLIC PANEL
ALT: 64 U/L — ABNORMAL HIGH (ref 0–44)
AST: 24 U/L (ref 15–41)
Albumin: 3.7 g/dL (ref 3.5–5.0)
Alkaline Phosphatase: 145 U/L — ABNORMAL HIGH (ref 38–126)
Anion gap: 7 (ref 5–15)
BUN: 18 mg/dL (ref 8–23)
CO2: 24 mmol/L (ref 22–32)
Calcium: 8.5 mg/dL — ABNORMAL LOW (ref 8.9–10.3)
Chloride: 107 mmol/L (ref 98–111)
Creatinine, Ser: 1.08 mg/dL (ref 0.61–1.24)
GFR calc Af Amer: >60 mL/min (ref >60)
GFR calc non Af Amer: >60 mL/min (ref >60)
Glucose, Bld: 343 mg/dL — ABNORMAL HIGH (ref 70–99)
Potassium: 3.7 mmol/L (ref 3.5–5.1)
Sodium: 138 mmol/L (ref 135–145)
Total Bilirubin: 0.6 mg/dL (ref 0.3–1.2)
Total Protein: 6.6 g/dL (ref 6.5–8.1)

## 2018-09-01 LAB — ETHANOL: Alcohol, Ethyl (B): <10 mg/dL (ref <10)

## 2018-09-01 LAB — PROTIME-INR
INR: 0.89
Prothrombin Time: 12 seconds (ref 11.4–15.2)

## 2018-09-01 LAB — APTT: aPTT: 28 seconds (ref 24–36)

## 2018-09-01 LAB — TROPONIN I: Troponin I: <0.03 ng/mL (ref <0.03)

## 2018-09-01 MED ORDER — STROKE: EARLY STAGES OF RECOVERY BOOK
Freq: Once | Status: AC
  Administered 2018-09-01: 14:00:00

## 2018-09-01 MED ORDER — INSULIN ASPART 100 UNIT/ML ~~LOC~~ SOLN
0.0000 [IU] | Freq: Every day | SUBCUTANEOUS | Status: DC
  Administered 2018-09-01 – 2018-09-03 (×2): 2 [IU] via SUBCUTANEOUS
  Filled 2018-09-01 (×2): qty 1

## 2018-09-01 MED ORDER — LISINOPRIL 20 MG PO TABS
40.0000 mg | ORAL_TABLET | Freq: Every day | ORAL | Status: DC
  Administered 2018-09-01 – 2018-09-06 (×6): 40 mg via ORAL
  Filled 2018-09-01 (×6): qty 2

## 2018-09-01 MED ORDER — INSULIN ASPART 100 UNIT/ML ~~LOC~~ SOLN
0.0000 [IU] | Freq: Three times a day (TID) | SUBCUTANEOUS | Status: DC
  Administered 2018-09-01: 5 [IU] via SUBCUTANEOUS
  Administered 2018-09-02: 2 [IU] via SUBCUTANEOUS
  Administered 2018-09-02 – 2018-09-04 (×5): 3 [IU] via SUBCUTANEOUS
  Filled 2018-09-01 (×7): qty 1

## 2018-09-01 MED ORDER — ACETAMINOPHEN 650 MG RE SUPP: 650.0000 mg | RECTAL | Status: DC | PRN

## 2018-09-01 MED ORDER — INSULIN DETEMIR 100 UNIT/ML ~~LOC~~ SOLN
10.0000 [IU] | Freq: Every day | SUBCUTANEOUS | Status: DC
  Administered 2018-09-01 – 2018-09-03 (×3): 10 [IU] via SUBCUTANEOUS
  Filled 2018-09-01 (×4): qty 0.1

## 2018-09-01 MED ORDER — INSULIN DETEMIR 100 UNIT/ML ~~LOC~~ SOLN
10.0000 [IU] | Freq: Every day | SUBCUTANEOUS | Status: DC
  Filled 2018-09-01: qty 0.1

## 2018-09-01 MED ORDER — HYDRALAZINE HCL 20 MG/ML IJ SOLN
10.0000 mg | Freq: Four times a day (QID) | INTRAMUSCULAR | Status: DC | PRN
  Administered 2018-09-01 – 2018-09-02 (×2): 10 mg via INTRAVENOUS
  Filled 2018-09-01 (×2): qty 1

## 2018-09-01 MED ORDER — NICOTINE 14 MG/24HR TD PT24
14.0000 mg | MEDICATED_PATCH | Freq: Every day | TRANSDERMAL | Status: DC
  Administered 2018-09-01 – 2018-09-06 (×6): 14 mg via TRANSDERMAL
  Filled 2018-09-01 (×6): qty 1

## 2018-09-01 MED ORDER — GABAPENTIN 300 MG PO CAPS
600.0000 mg | ORAL_CAPSULE | Freq: Three times a day (TID) | ORAL | Status: DC
  Administered 2018-09-01 – 2018-09-06 (×15): 600 mg via ORAL
  Filled 2018-09-01 (×15): qty 2

## 2018-09-01 MED ORDER — ASPIRIN 81 MG PO CHEW
324.0000 mg | CHEWABLE_TABLET | Freq: Once | ORAL | Status: AC
  Administered 2018-09-01: 324 mg via ORAL
  Filled 2018-09-01: qty 4

## 2018-09-01 MED ORDER — TRAMADOL HCL 50 MG PO TABS: 50.0000 mg | ORAL_TABLET | Freq: Four times a day (QID) | ORAL | Status: DC | PRN

## 2018-09-01 MED ORDER — ACETAMINOPHEN 325 MG PO TABS: 650.0000 mg | ORAL_TABLET | ORAL | Status: DC | PRN

## 2018-09-01 MED ORDER — ATORVASTATIN CALCIUM 20 MG PO TABS
40.0000 mg | ORAL_TABLET | Freq: Every evening | ORAL | Status: DC
  Administered 2018-09-01: 40 mg via ORAL
  Filled 2018-09-01: qty 2

## 2018-09-01 MED ORDER — CLOPIDOGREL BISULFATE 75 MG PO TABS
75.0000 mg | ORAL_TABLET | Freq: Every day | ORAL | Status: DC
  Administered 2018-09-01 – 2018-09-06 (×6): 75 mg via ORAL
  Filled 2018-09-01 (×6): qty 1

## 2018-09-01 MED ORDER — GLIPIZIDE 10 MG PO TABS
10.0000 mg | ORAL_TABLET | Freq: Every day | ORAL | Status: DC
  Administered 2018-09-02 – 2018-09-05 (×4): 10 mg via ORAL
  Filled 2018-09-01 (×5): qty 1

## 2018-09-01 MED ORDER — ACETAMINOPHEN 160 MG/5ML PO SOLN
650.0000 mg | ORAL | Status: DC | PRN
  Filled 2018-09-01: qty 20.3

## 2018-09-01 MED ORDER — ENOXAPARIN SODIUM 40 MG/0.4ML ~~LOC~~ SOLN
40.0000 mg | SUBCUTANEOUS | Status: DC
  Administered 2018-09-01 – 2018-09-05 (×5): 40 mg via SUBCUTANEOUS
  Filled 2018-09-01 (×4): qty 0.4

## 2018-09-01 MED ORDER — ASPIRIN EC 81 MG PO TBEC
81.0000 mg | DELAYED_RELEASE_TABLET | Freq: Every day | ORAL | Status: DC
  Administered 2018-09-02 – 2018-09-06 (×5): 81 mg via ORAL
  Filled 2018-09-01 (×5): qty 1

## 2018-09-01 NOTE — ED Triage Notes (Signed)
States went to bed last night at around 2300 and awoke this morning at 0700 with left facial droop and left sided weakness.

## 2018-09-01 NOTE — Progress Notes (Signed)
Inpatient Diabetes Program Recommendations  AACE/ADA: New Consensus Statement on Inpatient Glycemic Control   Target Ranges:  Prepandial:   less than 140 mg/dL      Peak postprandial:   less than 180 mg/dL (1-2 hours)      Critically ill patients:  140 - 180 mg/dL   Results for Evan Townsend, Evan SR. (MRN [id number]) as of 09/01/2018 10:10  Ref. Range 09/01/2018 09:10  Glucose-Capillary Latest Ref Range: 70 - 99 mg/dL 592 (H)  Results for Evan Townsend, Evan SR. (MRN [id number]) as of 09/01/2018 10:10  Ref. Range 09/01/2018 09:13  Glucose Latest Ref Range: 70 - 99 mg/dL 817 (H)   Review of Glycemic Control  Diabetes history: DM2 Outpatient Diabetes medications: Per office note on Dr. Marilu Favre on 07/29/18, patient should be taking Levemir 15 units QHS and Metformin 1000 mg BID Current orders for Inpatient glycemic control: None; in Emergency Department  Inpatient Diabetes Program Recommendations:  Insulin - Basal: If patient is admitted, please consider ordering Levemir 10 units Q24H. Correction (SSI): If patient is admitted, please consider ordering CBGs with Novolog 0-15 units TID with meals and Novolog 0-5 units QHS. HgbA1C: Per Care Everywhere, last A1C was 9.5% on 07/29/2018 indicating an average glucose of 226 mg/dl.  Thanks, Orlando Penner, RN, MSN, CDE Diabetes Coordinator Inpatient Diabetes Program [phone] (Team Pager from 8am to 5pm)

## 2018-09-01 NOTE — H&P (Signed)
[facility] [place] [facility]   PATIENT NAME: Evan Townsend    MR#:  [id number]  DATE OF BIRTH:  1956-06-16  DATE OF ADMISSION:  09/01/2018  PRIMARY CARE PHYSICIAN: Patient, No Pcp Per   REQUESTING/REFERRING PHYSICIAN: Emily FilbertWilliams, Jonathan E, MD  CHIEF COMPLAINT:   Slurry speech and left-sided weakness HISTORY OF PRESENT ILLNESS:  Evan ChaletDuncan Tweed  is a 63 y.o. male with a known history of CVA in the past with no residual deficits, insulin requiring diabetes mellitus, hypertension, hyperlipidemia is presenting to the ED with a chief complaint of left facial droop and left-sided weakness associated with slurry speech which was noted upon awakening this morning at around 7 AM.  Patient was in his usual state of health until last night at 11 PM when he felt asleep.  CT head is negative and patient has passed bedside swallow evaluation.  During my examination patient still has slurry speech and left leg weakness.  No family members at bedside.  Denies any chest pain or shortness of breath.  He admits drinking and last drink was on January 1.  Continues to smoke  PAST MEDICAL HISTORY:   Past Medical History:  Diagnosis Date  . Diabetes 1.5, managed as type 2 (HCC)   . Diabetes mellitus without complication (HCC)   . Hypercholesterolemia   . Hypertension   . Pain in both feet   . Stroke [facility](HCC)     PAST SURGICAL HISTOIRY:  History reviewed. No pertinent surgical history.  SOCIAL HISTORY:   Social History   Tobacco Use  . Smoking status: Current Every Day Smoker    Packs/day: 0.25    Types: Cigarettes    Start date: 181982  . Smokeless tobacco: Never Used  Substance Use Topics  . Alcohol use: Yes    Alcohol/week: 1.0 standard drinks    Types: 1 Cans of beer per week    FAMILY HISTORY:   Family History  Problem Relation Age of Onset  . Hypertension Mother   . Hypertension Father   . Diabetes Father     DRUG ALLERGIES:  No Known  Allergies  REVIEW OF SYSTEMS:  CONSTITUTIONAL: No fever, fatigue or weakness.  EYES: No blurred or double vision.  EARS, NOSE, AND THROAT: No tinnitus or ear pain.  RESPIRATORY: No cough, shortness of breath, wheezing or hemoptysis.  CARDIOVASCULAR: No chest pain, orthopnea, edema.  GASTROINTESTINAL: No nausea, vomiting, diarrhea or abdominal pain.  GENITOURINARY: No dysuria, hematuria.  ENDOCRINE: No polyuria, nocturia,  HEMATOLOGY: No anemia, easy bruising or bleeding SKIN: No rash or lesion. MUSCULOSKELETAL: No joint pain or arthritis.   NEUROLOGIC: No tingling, numbness, has Left sided weakness, left facial droop and slurry speech PSYCHIATRY: No anxiety or depression.   MEDICATIONS AT HOME:   Prior to Admission medications   Medication Sig Start Date End Date Taking? Authorizing Provider  amLODipine (NORVASC) 10 MG tablet Take 10 mg by mouth daily. 07/29/18 07/29/19 Yes [provider]  aspirin 81 MG EC tablet Take 1 tablet (81 mg total) by mouth daily. 02/03/17  Yes Virl Axehaplin, Don C, MD  atorvastatin (LIPITOR) 40 MG tablet Take 1 tablet (40 mg total) by mouth every evening. 03/08/18  Yes Doles-Johnson, Teah, NP  gabapentin (NEURONTIN) 300 MG capsule Take 2 capsules (600 mg total) by mouth 3 (three) times daily. 03/08/18 03/08/19 Yes Doles-Johnson, Teah, NP  glipiZIDE (GLUCOTROL) 10 MG tablet Take 1 tablet (10 mg total) by mouth daily before breakfast. 03/08/18  Yes Doles-Johnson, Teah,  NP  insulin aspart protamine- aspart (NOVOLOG MIX 70/30) (70-30) 100 UNIT/ML injection Inject 0.2 mLs (20 Units total) into the skin 2 (two) times daily with a meal. Patient taking differently: Inject 15 Units into the skin daily with supper.  03/08/18  Yes Doles-Johnson, Teah, NP  lisinopril (PRINIVIL,ZESTRIL) 40 MG tablet Take 1 tablet (40 mg total) by mouth daily. 09/11/17 09/11/18 Yes Auburn Bilberry, MD  metFORMIN (GLUCOPHAGE-XR) 500 MG 24 hr tablet Take 1,000 mg by mouth 2 (two) times daily with  a meal. 06/22/18  Yes [provider]  traMADol (ULTRAM) 50 MG tablet Take 1 tablet (50 mg total) by mouth every 6 (six) hours as needed. 10/17/17  Yes Evon Slack, PA-C  clopidogrel (PLAVIX) 75 MG tablet Take 1 tablet (75 mg total) by mouth daily. 09/12/17   Auburn Bilberry, MD  HYDROcodone-acetaminophen (NORCO/VICODIN) 5-325 MG tablet Take 1 tablet by mouth every 6 (six) hours as needed for moderate pain. Patient not taking: Reported on 09/01/2018 10/04/17   Tommi Rumps, PA-C  meloxicam (MOBIC) 15 MG tablet Take 1 tablet (15 mg total) by mouth daily. Patient not taking: Reported on 09/01/2018 11/11/16   Auburn Bilberry, MD  predniSONE (DELTASONE) 10 MG tablet Take 1 tablet (10 mg total) by mouth daily. 6,5,4,3,2,1 six day taper Patient not taking: Reported on 09/01/2018 10/17/17   Evon Slack, PA-C      VITAL SIGNS:  Blood pressure (!) 193/79, pulse (!) 49, temperature 97.8 F (36.6 C), temperature source Oral, resp. rate 17, height 5\' 6"  (1.676 m), weight 68 kg, SpO2 100 %.  PHYSICAL EXAMINATION:  GENERAL:  63 y.o.-year-old patient lying in the bed with no acute distress.  EYES: Pupils equal, round, reactive to light and accommodation. No scleral icterus. Extraocular muscles intact.  HEENT: Head atraumatic, normocephalic. Oropharynx and nasopharynx clear.  NECK:  Supple, no jugular venous distention. No thyroid enlargement, no tenderness.  LUNGS: Normal breath sounds bilaterally, no wheezing, rales,rhonchi or crepitation. No use of accessory muscles of respiration.  CARDIOVASCULAR: S1, S2 normal. No murmurs, rubs, or gallops.  ABDOMEN: Soft, nontender, nondistended. Bowel sounds present.   EXTREMITIES: No pedal edema, cyanosis, or clubbing.  NEUROLOGIC: Awake, alert and oriented x3 muscle strength 5/5 in all extremities except left lower extremity 4 out of 5 sensation intact. Gait not checked.  Left facial droop and left mouth is deviated to the right side.  Positive  dysarthria PSYCHIATRIC: The patient is alert and oriented x 3.  SKIN: No obvious rash, lesion, or ulcer.   LABORATORY PANEL:   CBC Recent Labs  Lab 09/01/18 0913  WBC 5.9  HGB 11.8*  HCT 35.3*  PLT 241   ------------------------------------------------------------------------------------------------------------------  Chemistries  Recent Labs  Lab 09/01/18 0913  NA 138  K 3.7  CL 107  CO2 24  GLUCOSE 343*  BUN 18  CREATININE 1.08  CALCIUM 8.5*  AST 24  ALT 64*  ALKPHOS 145*  BILITOT 0.6   ------------------------------------------------------------------------------------------------------------------  Cardiac Enzymes Recent Labs  Lab 09/01/18 0913  TROPONINI <0.03   ------------------------------------------------------------------------------------------------------------------  RADIOLOGY:  Ct Head Wo Contrast  Result Date: 09/01/2018 CLINICAL DATA:  Left facial droop and left-sided weakness. Suspect stroke. EXAM: CT HEAD WITHOUT CONTRAST TECHNIQUE: Contiguous axial images were obtained from the base of the skull through the vertex without intravenous contrast. COMPARISON:  MRI head 09/10/2017, CT head 09/10/2017 FINDINGS: Brain: Ventricle size normal. Patchy hypodensity in the cerebral white matter bilaterally appears unchanged. Small chronic infarct in the left thalamus unchanged.  Negative for acute infarct, hemorrhage, or mass. Vascular: Negative for hyperdense vessel Skull: Negative Sinuses/Orbits: Mucosal edema right maxillary sinus.  Normal orbit Other: None IMPRESSION: No acute intracranial abnormality. Chronic microvascular ischemic changes similar to the prior study. Electronically Signed   By: Marlan Palau M.D.   On: 09/01/2018 09:23    EKG:   Orders placed or performed during the hospital encounter of 09/01/18  . ED EKG  . ED EKG  . EKG 12-Lead  . EKG 12-Lead    IMPRESSION AND PLAN:   #TIA versus CVA with past history of CVA Admit to  MedSurg unit CT head is negative We will get a complete stroke work-up with carotid Dopplers, 2D echocardiogram, MRI of the brain Speech therapy, PT OT evaluation Patient has passed bedside swallow evaluation Neurology consult placed and text message sent to Dr. Lavonna Monarch via haiku Neurochecks Check fasting lipid panel, hemoglobin A1c and TSH Allow permissive hypertension while ruling out stroke  #Insulin requiring diabetes mellitus Levemir 10 units subcu every 24 hours and moderate sliding scale insulin  #Hypertension-blood pressure elevated Will hold amlodipine and continue lisinopril as blood pressure is really high Will allow permissive hypertension  #Hyperlipidemia continue statin check fasting lipid panel  #Past history of stroke no residual deficits continue home medication aspirin 81 mg Plavix 75 and statin  #Tobacco abuse disorder counseled patient to quit smoking for 5 minutes.  He verbalized understanding and agreeable to use nicotine patch   All the records are reviewed and case discussed with ED provider. Management plans discussed with the patient, family and they are in agreement.  CODE STATUS:DNR, wife is the healthcare POA  TOTAL TIME TAKING CARE OF THIS PATIENT: 43 minutes.   Note: This dictation was prepared with Dragon dictation along with smaller phrase technology. Any transcriptional errors that result from this process are unintentional.  Ramonita Lab M.D on 09/01/2018 at 11:08 AM  Between 7am to 6pm - Pager - [phone]  After 6pm go to [email or web address] - password [email or web address]  [facility]  Office  [phone]  CC: Primary care physician; Patient, No Pcp Per

## 2018-09-01 NOTE — ED Notes (Signed)
Floor unable to take report at this time.

## 2018-09-01 NOTE — Progress Notes (Signed)
Family Meeting Note  Advance Directive:yes  Today a meeting took place with the Patient.    The following clinical team members were present during this meeting:MD  The following were discussed:Patient's diagnosis: TIA, dysarthria, history of stroke, insulin requiring diabetes mellitus, hypertension, hyperlipidemia, tobacco abuse and other medical problems and treatment plan of care discussed in detail with the patient.  He verbalized understanding of the plan.    Patient's progosis: Unable to determine and Goals for treatment: DNR  Wife is the healthcare power of attorney  Additional follow-up to be provided: Hospitalist and neurology  Time spent during discussion:17 MIN  Ramonita Lab, MD

## 2018-09-01 NOTE — Progress Notes (Addendum)
Pt BP was at 203/ 97 HR 72. Notify Dr. Madelon Lips and ordered 10 mg IV Hydralazine every 6 hours as needed for Systolic BP >160. Will continue to monitor.  Update 2240: Pt BP at 184/97 HR 79. Notify Dr. Madelon Lips and ordered to monitor it at this time.  Update 0049: Pt BP at 158/81 Hr 60. Will continue to monitor.

## 2018-09-01 NOTE — Progress Notes (Addendum)
Contacted the case manager to determine the condition of the home life of the pt. Chaplain attempted to visit pt but he was not in the room. Spoke w/ the pt nurse and she said he was having a procedure but wife would come back to check on him.

## 2018-09-01 NOTE — Consult Note (Signed)
Referring Physician: Ramonita Lab MD    Chief Complaint: Left facial droop, left-sided numbness and weakness, dysarthria  HPI: Evan Townsend Sr. is an 63 y.o. male with past medical history of diabetes mellitus, hyperlipidemia, hypertension, prior stroke, and tobacco use presenting to the ED with chief complaints of left facial droop, left sided weakness and numbness and dysarthria since 0730 am.  Patient's wife who is the primary informant reports that patient went to bed last night at around 2300  and when she woke him up this morning at 0730 she noticed that he was not his normal self.  Patient's wife reports that he had difficulty getting out of the bed and was somewhat slurring his speech, she also noticed left facial droop and slight drooling.  When she tried to get patient out of the bed patient appeared to be wobbly and could not walk without assistance. Denies associated altered sensorium, cranial nerve deficit, seizures, diplopia, nausea or vomiting, syncope or LOC, headache, dizziness or other associated neurologic deficit.  On arrival to the ED patient was noted to have left facial droop and slurred speech.  Blood pressure was elevated at 192/93, heart rate 47, blood glucose 329.  Initial NIH stroke scale 0.  Initial CT head was negative for acute intracranial abnormality.  Labs revealed negative troponin, blood glucose 343, calcium 8.5, ALT 64, alkaline phosphate 145, normal white count, ethanol<10.  Patient was therefore admitted for further stroke work-up and management.  Date last known well: Date: 08/31/2018 Time last known well: Time: 23:00 tPA Given: No: Outside window period  Past Medical History:  Diagnosis Date  . Diabetes 1.5, managed as type 2 (HCC)   . Diabetes mellitus without complication (HCC)   . Hypercholesterolemia   . Hypertension   . Pain in both feet   . Stroke [facility])     History reviewed. No pertinent surgical history.  Family History  Problem Relation Age  of Onset  . Hypertension Mother   . Hypertension Father   . Diabetes Father    Social History:  reports that he has been smoking cigarettes. He started smoking about 38 years ago. He has been smoking about 0.25 packs per day. He has never used smokeless tobacco. He reports current alcohol use of about 1.0 standard drinks of alcohol per week. He reports that he does not use drugs.  Allergies: No Known Allergies  Medications:  I have reviewed the patient's current medications. Prior to Admission:  Medications Prior to Admission  Medication Sig Dispense Refill Last Dose  . amLODipine (NORVASC) 10 MG tablet Take 10 mg by mouth daily.   Past Week at Unknown time  . aspirin 81 MG EC tablet Take 1 tablet (81 mg total) by mouth daily. 30 tablet  09/01/2018 at 0700  . atorvastatin (LIPITOR) 40 MG tablet Take 1 tablet (40 mg total) by mouth every evening. 30 tablet 0 08/31/2018 at 2100  . gabapentin (NEURONTIN) 300 MG capsule Take 2 capsules (600 mg total) by mouth 3 (three) times daily. 180 capsule 2 08/31/2018 at 2100  . glipiZIDE (GLUCOTROL) 10 MG tablet Take 1 tablet (10 mg total) by mouth daily before breakfast. 30 tablet 3 09/01/2018 at 0700  . insulin aspart protamine- aspart (NOVOLOG MIX 70/30) (70-30) 100 UNIT/ML injection Inject 0.2 mLs (20 Units total) into the skin 2 (two) times daily with a meal. (Patient taking differently: Inject 15 Units into the skin daily with supper. ) 10 mL 11 08/31/2018 at Unknown time  .  lisinopril (PRINIVIL,ZESTRIL) 40 MG tablet Take 1 tablet (40 mg total) by mouth daily. 30 tablet 3 08/31/2018 at 0700  . metFORMIN (GLUCOPHAGE-XR) 500 MG 24 hr tablet Take 1,000 mg by mouth 2 (two) times daily with a meal.   Past Week at Unknown time  . traMADol (ULTRAM) 50 MG tablet Take 1 tablet (50 mg total) by mouth every 6 (six) hours as needed. 20 tablet 0 Past Month at Unknown time  . clopidogrel (PLAVIX) 75 MG tablet Take 1 tablet (75 mg total) by mouth daily. 30 tablet 0 Taking   . HYDROcodone-acetaminophen (NORCO/VICODIN) 5-325 MG tablet Take 1 tablet by mouth every 6 (six) hours as needed for moderate pain. (Patient not taking: Reported on 09/01/2018) 12 tablet 0 Completed Course at Unknown time  . meloxicam (MOBIC) 15 MG tablet Take 1 tablet (15 mg total) by mouth daily. (Patient not taking: Reported on 09/01/2018) 30 tablet 0 Not Taking at Unknown time  . predniSONE (DELTASONE) 10 MG tablet Take 1 tablet (10 mg total) by mouth daily. 6,5,4,3,2,1 six day taper (Patient not taking: Reported on 09/01/2018) 21 tablet 0 Completed Course at Unknown time   Scheduled: .  stroke: mapping our early stages of recovery book   Does not apply Once  . [START ON 09/02/2018] aspirin EC  81 mg Oral Daily  . atorvastatin  40 mg Oral QPM  . clopidogrel  75 mg Oral Daily  . enoxaparin (LOVENOX) injection  40 mg Subcutaneous Q24H  . gabapentin  600 mg Oral TID  . [START ON 09/02/2018] glipiZIDE  10 mg Oral QAC breakfast  . insulin aspart  0-15 Units Subcutaneous TID WC  . insulin aspart  0-5 Units Subcutaneous QHS  . insulin detemir  10 Units Subcutaneous Daily  . lisinopril  40 mg Oral Daily  . nicotine  14 mg Transdermal Daily    ROS: History obtained from the patient   General ROS: negative for - chills, fatigue, fever, night sweats, weight gain or weight loss Psychological ROS: negative for - behavioral disorder, hallucinations, memory difficulties, mood swings or suicidal ideation Ophthalmic ROS: negative for - blurry vision, double vision, eye pain or loss of vision ENT ROS: negative for - epistaxis, nasal discharge, oral lesions, sore throat, tinnitus or vertigo Allergy and Immunology ROS: negative for - hives or itchy/watery eyes Hematological and Lymphatic ROS: negative for - bleeding problems, bruising or swollen lymph nodes Endocrine ROS: negative for - galactorrhea, hair pattern changes, polydipsia/polyuria or temperature intolerance Respiratory ROS: negative for - cough,  hemoptysis, shortness of breath or wheezing Cardiovascular ROS: negative for - chest pain, dyspnea on exertion, edema or irregular heartbeat Gastrointestinal ROS: negative for - abdominal pain, diarrhea, hematemesis, nausea/vomiting or stool incontinence Genito-Urinary ROS: negative for - dysuria, hematuria, incontinence or urinary frequency/urgency Musculoskeletal ROS: negative for - joint swelling or muscular weakness Neurological ROS: as noted in HPI Dermatological ROS: negative for rash and skin lesion changes  Physical Examination: Blood pressure (!) 192/93, pulse (!) 47, temperature 97.7 F (36.5 C), temperature source Oral, resp. rate 20, height 5\' 6"  (1.676 m), weight 68 kg, SpO2 100 %.   HEENT-  Normocephalic, no lesions, without obvious abnormality.  Normal external eye and conjunctiva.  Normal TM's bilaterally.  Normal auditory canals and external ears. Normal external nose, mucus membranes and septum.  Normal pharynx. Cardiovascular- S1, S2 normal, pulses palpable throughout   Lungs- chest clear, no wheezing, rales, normal symmetric air entry Abdomen- soft, non-tender; bowel sounds normal; no masses,  no organomegaly Extremities- no edema Lymph-no adenopathy palpable Musculoskeletal-no joint tenderness, deformity or swelling Skin-warm and dry, no hyperpigmentation, vitiligo, or suspicious lesions  Neurological Exam   Mental Status: Alert, oriented, thought content appropriate.  Speech dysarthric without evidence of aphasia.  Able to follow 3 step commands without difficulty. Attention span and concentration seemed appropriate  Cranial Nerves: II: Discs flat bilaterally; Visual fields grossly normal, pupils equal, round, reactive to light and accommodation III,IV, VI: ptosis not present, extra-ocular motions intact bilaterally V,VII: smile asymmetric, left facial droop, facial light touch sensation  decreased on the left VIII: hearing normal bilaterally IX,X: gag reflex  present XI: bilateral shoulder shrug XII: midline tongue extension Motor: Right :  Upper extremity   5/5 Without pronator drift      Left: Upper extremity   5/5 without pronator drift Right:   Lower extremity   5/5                                          Left: Lower extremity   4/5 Tone and bulk:normal tone throughout; no atrophy noted Sensory: Pinprick and light touch  decreased on the left Deep Tendon Reflexes: 2+ and symmetric throughout Plantars: Right: mute                              Left: mute Cerebellar: Finger-to-nose testing intact bilaterally. Heel to shin testing normal bilaterally Gait: not tested due to safety concerns  Data Reviewed  Laboratory Studies:  Basic Metabolic Panel: Recent Labs  Lab 09/01/18 0913  NA 138  K 3.7  CL 107  CO2 24  GLUCOSE 343*  BUN 18  CREATININE 1.08  CALCIUM 8.5*    Liver Function Tests: Recent Labs  Lab 09/01/18 0913  AST 24  ALT 64*  ALKPHOS 145*  BILITOT 0.6  PROT 6.6  ALBUMIN 3.7   No results for input(s): LIPASE, AMYLASE in the last 168 hours. No results for input(s): AMMONIA in the last 168 hours.  CBC: Recent Labs  Lab 09/01/18 0913  WBC 5.9  NEUTROABS 3.3  HGB 11.8*  HCT 35.3*  MCV 88.3  PLT 241    Cardiac Enzymes: Recent Labs  Lab 09/01/18 0913  TROPONINI <0.03    BNP: Invalid input(s): POCBNP  CBG: Recent Labs  Lab 09/01/18 0910  GLUCAP 329*    Microbiology: No results found for this or any previous visit.  Coagulation Studies: Recent Labs    09/01/18 0913  LABPROT 12.0  INR 0.89    Urinalysis: No results for input(s): COLORURINE, LABSPEC, PHURINE, GLUCOSEU, HGBUR, BILIRUBINUR, KETONESUR, PROTEINUR, UROBILINOGEN, NITRITE, LEUKOCYTESUR in the last 168 hours.  Invalid input(s): APPERANCEUR  Lipid Panel:    Component Value Date/Time   CHOL 161 02/10/2018 1906   TRIG 213 (H) 02/10/2018 1906   HDL 64 02/10/2018 1906   CHOLHDL 2.5 02/10/2018 1906   CHOLHDL 2.7 09/11/2017  0513   VLDL 35 09/11/2017 0513   LDLCALC 54 02/10/2018 1906    HgbA1C:  Lab Results  Component Value Date   HGBA1C 10.3 (H) 02/10/2018    Urine Drug Screen:      Component Value Date/Time   LABOPIA NONE DETECTED 11/10/2016 0909   COCAINSCRNUR POSITIVE (A) 11/10/2016 0909   LABBENZ NONE DETECTED 11/10/2016 0909   AMPHETMU NONE DETECTED 11/10/2016 0909   THCU NONE DETECTED  11/10/2016 0909   LABBARB NONE DETECTED 11/10/2016 0909    Alcohol Level:  Recent Labs  Lab 09/01/18 0913  ETH <10    Other results: EKG: unchanged from previous tracings, sinus bradycardia, RBBB. Vent. rate 51 BPM PR interval * ms QRS duration 170 ms QT/QTc 484/446 ms P-R-T axes 55 6 145  Imaging: Ct Head Wo Contrast  Result Date: 09/01/2018 CLINICAL DATA:  Left facial droop and left-sided weakness. Suspect stroke. EXAM: CT HEAD WITHOUT CONTRAST TECHNIQUE: Contiguous axial images were obtained from the base of the skull through the vertex without intravenous contrast. COMPARISON:  MRI head 09/10/2017, CT head 09/10/2017 FINDINGS: Brain: Ventricle size normal. Patchy hypodensity in the cerebral white matter bilaterally appears unchanged. Small chronic infarct in the left thalamus unchanged. Negative for acute infarct, hemorrhage, or mass. Vascular: Negative for hyperdense vessel Skull: Negative Sinuses/Orbits: Mucosal edema right maxillary sinus.  Normal orbit Other: None IMPRESSION: No acute intracranial abnormality. Chronic microvascular ischemic changes similar to the prior study. Electronically Signed   By: Marlan Palauharles  Clark M.D.   On: 09/01/2018 09:23   [place] Carotid Bilateral (at [facility] Only)  Result Date: 09/01/2018 CLINICAL DATA:  Stroke/TIA. History of hypertension, hyperlipidemia, diabetes and smoking EXAM: BILATERAL CAROTID DUPLEX ULTRASOUND TECHNIQUE: Wallace CullensGray scale imaging, color Doppler and duplex ultrasound were performed of bilateral carotid and vertebral arteries in the neck. COMPARISON:  None.  FINDINGS: Criteria: Quantification of carotid stenosis is based on velocity parameters that correlate the residual internal carotid diameter with NASCET-based stenosis levels, using the diameter of the distal internal carotid lumen as the denominator for stenosis measurement. The following velocity measurements were obtained: RIGHT ICA: 94/29 cm/sec CCA: 85/18 cm/sec SYSTOLIC ICA/CCA RATIO:  1.1 ECA: 97 cm/sec LEFT ICA: 85/31 cm/sec CCA: 87/20 cm/sec SYSTOLIC ICA/CCA RATIO:  1.2 ECA: 65 cm/sec RIGHT CAROTID ARTERY: There is a minimal to moderate amount of intimal thickening/hypoechoic plaque within the right carotid bulb (images 15 and 18), extending to involve the origin and proximal aspects of the right internal carotid artery (image 22), not resulting in elevated peak systolic velocities within the interrogated course the right internal carotid artery to suggest a hemodynamically significant stenosis. RIGHT VERTEBRAL ARTERY:  Antegrade flow LEFT CAROTID ARTERY: There is no grayscale evidence of significant intimal thickening or atherosclerotic plaque affecting the interrogated portions of the left carotid system. There are no elevated peak systolic velocities within the interrogated course of the left internal carotid artery to suggest a hemodynamically significant stenosis. LEFT VERTEBRAL ARTERY:  Antegrade Flow IMPRESSION: 1. Minimal to moderate amount of right-sided atherosclerotic plaque, not resulting in a hemodynamically significant stenosis. 2. Normal sonographic evaluation of the left carotid system. Electronically Signed   By: Simonne ComeJohn  Watts M.D.   On: 09/01/2018 12:12   Assessment: 63 y.o. male with past medical history of diabetes mellitus, hyperlipidemia, hypertension, prior stroke, and tobacco use presenting to the ED with chief complaints of left facial droop, left sided weakness and numbness and dysarthria since 0730 am.  Concerns for ischemic event.  CT head reviewed and shows no acute intracranial  abnormality.  Ultrasound carotids bilateral did not show significant hemodynamically stenosis.  Patient report he was taking aspirin 81 mg prior to this event.  Further work-up recommended.  Stroke Risk Factors - diabetes mellitus, family history, hyperlipidemia, hypertension and smoking  Plan: 1. HgbA1c, fasting lipid panel 2. MRI, MRA  of the brain without contrast 3. PT consult, OT consult, Speech consult 4. Echocardiogram 5. Start medical management with dual  therapy Aspirin 81 mg/day and Plavix 75 mg /day  7.  Smoking cessation counseling 6. NPO until RN stroke swallow screen 8. Telemetry monitoring 9. Frequent neuro checks  This patient was staffed with Dr. Jonna Munro who personally evaluated patient, reviewed documentation and agreed with assessment and plan of care as above.  Webb Silversmith, DNP, FNP-BC Board certified Nurse Practitioner Neurology Department   09/01/2018, 3:08 PM

## 2018-09-01 NOTE — Progress Notes (Signed)
Patient admitted to 2A 236 from ED via stretcher.  Alert and oriented with wife at bedside. Patient noted to have slight L sided facial droop and left sided weakness.  No c/o pain or SOB.  Patient and spouse oriented to floor and surroundings.  Call bell in reach and bed alarm on for safety.

## 2018-09-01 NOTE — ED Provider Notes (Signed)
[facility] Emergency Department Provider Note       Time seen: ----------------------------------------- 9:02 AM on 09/01/2018 -----------------------------------------   I have reviewed the triage vital signs and the nursing notes.  HISTORY   Chief Complaint left sided weakness    HPI Evan DORSA Sr. is a 63 y.o. male with a history of diabetes, hyperlipidemia, hypertension, CVA who presents to the ED for left facial droop and left-sided weakness that he noted upon awakening this morning at 7:00.  He went to bed normally last night at 11 PM.  Patient reports a history of CVA several years ago which resulted in left hand numbness which has persisted somewhat.  He has never had dramatic symptoms like he has had this morning.  Wife noticed that he was drooling when he ate this morning.  He states he can swallow normally.  Patient also notes that he has run out of his metformin and his blood sugars stay high.  Past Medical History:  Diagnosis Date  . Diabetes 1.5, managed as type 2 (HCC)   . Diabetes mellitus without complication (HCC)   . Hypercholesterolemia   . Hypertension   . Pain in both feet   . Stroke [facility])     Patient Active Problem List   Diagnosis Date Noted  . Left leg pain 01/13/2018  . CVA (cerebral vascular accident) (HCC) 09/10/2017  . Hyperlipidemia 02/11/2017  . Acute CVA (cerebrovascular accident) (HCC) 11/10/2016  . Left sided numbness 11/10/2016  . Slurred speech 11/10/2016  . Essential hypertension 11/10/2016  . Diabetes (HCC) 11/10/2016    History reviewed. No pertinent surgical history.  Allergies Patient has no known allergies.  Social History Social History   Tobacco Use  . Smoking status: Current Every Day Smoker    Packs/day: 0.25    Types: Cigarettes    Start date: 22  . Smokeless tobacco: Never Used  Substance Use Topics  . Alcohol use: Yes    Alcohol/week: 1.0 standard drinks    Types: 1 Cans of  beer per week  . Drug use: No    Review of Systems Constitutional: Negative for fever. Cardiovascular: Negative for chest pain. Respiratory: Negative for shortness of breath. Gastrointestinal: Negative for abdominal pain, vomiting and diarrhea. Musculoskeletal: Negative for back pain. Skin: Negative for rash. Neurological: Positive for left face, left arm and left leg weakness  All systems negative/normal/unremarkable except as stated in the HPI  ____________________________________________   PHYSICAL EXAM:  VITAL SIGNS: ED Triage Vitals  Enc Vitals Group     BP 09/01/18 0852 (!) 173/80     Pulse Rate 09/01/18 0852 70     Resp 09/01/18 0852 16     Temp 09/01/18 0852 97.8 F (36.6 C)     Temp Source 09/01/18 0852 Oral     SpO2 09/01/18 0852 100 %     Weight 09/01/18 0851 150 lb (68 kg)     Height 09/01/18 0851 5\' 6"  (1.676 m)     Head Circumference --      Peak Flow --      Pain Score 09/01/18 0851 0     Pain Loc --      Pain Edu? --      Excl. in GC? --    Constitutional: Alert and oriented. Well appearing and in no distress. Eyes: Right-sided conjunctival injection is noted.  Normal extraocular movements. ENT      Head: Normocephalic and atraumatic.      Nose: No congestion/rhinnorhea.  Mouth/Throat: Mucous membranes are moist.      Neck: No stridor. Cardiovascular: Normal rate, regular rhythm. No murmurs, rubs, or gallops. Respiratory: Normal respiratory effort without tachypnea nor retractions. Breath sounds are clear and equal bilaterally. No wheezes/rales/rhonchi. Gastrointestinal: Soft and nontender. Normal bowel sounds Musculoskeletal: Nontender with normal range of motion in extremities. No lower extremity tenderness nor edema. Neurologic: Speech appears to be normal, left facial droop that spares the forehead, left arm weakness, left leg weakness.  No obvious acute sensory deficits are noted. Skin:  Skin is warm, dry and intact. No rash  noted. Psychiatric: Mood and affect are normal. Speech and behavior are normal.  ____________________________________________  EKG: Interpreted by me.  Sinus rhythm with a rate of 51 bpm, right bundle branch block, T wave abnormalities  ____________________________________________  ED COURSE:  As part of my medical decision making, I reviewed the following data within the electronic MEDICAL RECORD NUMBER History obtained from family if available, nursing notes, old chart and ekg, as well as notes from prior ED visits. Patient presented for neurologic symptoms that began sometime after he went to bed last night at 11, we will assess with labs and imaging as indicated at this time.   Procedures ____________________________________________   LABS (pertinent positives/negatives)  Labs Reviewed  ETHANOL  PROTIME-INR  APTT  CBC  DIFFERENTIAL  COMPREHENSIVE METABOLIC PANEL  TROPONIN I  URINE DRUG SCREEN, QUALITATIVE ([facility] ONLY)  URINALYSIS, ROUTINE W REFLEX MICROSCOPIC   CRITICAL CARE Performed by: Ulice Dash   Total critical care time: 30 minutes  Critical care time was exclusive of separately billable procedures and treating other patients.  Critical care was necessary to treat or prevent imminent or life-threatening deterioration.  Critical care was time spent personally by me on the following activities: development of treatment plan with patient and/or surrogate as well as nursing, discussions with consultants, evaluation of patient's response to treatment, examination of patient, obtaining history from patient or surrogate, ordering and performing treatments and interventions, ordering and review of laboratory studies, ordering and review of radiographic studies, pulse oximetry and re-evaluation of patient's condition.  RADIOLOGY Images were viewed by me  CT head IMPRESSION: No acute intracranial abnormality. Chronic microvascular ischemic changes similar to the  prior study. ____________________________________________   DIFFERENTIAL DIAGNOSIS   CVA, TIA, intracranial hemorrhage  FINAL ASSESSMENT AND PLAN  CVA   Plan: The patient had presented for stroke symptoms that began sometime after 11 PM last night. Patient's labs did reveal hyperglycemia but no other acute process. Patient's imaging was negative for acute intracranial abnormality.  Clinically he has had likely right MCA infarct.  The event occurred up to 11 hours ago.  He was given aspirin.  I will discuss with the hospitalist for admission.   Ulice Dash, MD    Note: This note was generated in part or whole with voice recognition software. Voice recognition is usually quite accurate but there are transcription errors that can and very often do occur. I apologize for any typographical errors that were not detected and corrected.     Emily Filbert, MD 09/01/18 1010

## 2018-09-02 ENCOUNTER — Inpatient Hospital Stay (HOSPITAL_COMMUNITY)
Admit: 2018-09-02 | Discharge: 2018-09-02 | Disposition: A | Discharge status: Home / Self Care | Payer: Medicaid Other | Attending: Internal Medicine | Admitting: Internal Medicine

## 2018-09-02 DIAGNOSIS — R001 Bradycardia, unspecified: Secondary | ICD-10-CM | POA: Diagnosis present

## 2018-09-02 DIAGNOSIS — Z716 Tobacco abuse counseling: Secondary | ICD-10-CM | POA: Diagnosis not present

## 2018-09-02 DIAGNOSIS — E119 Type 2 diabetes mellitus without complications: Secondary | ICD-10-CM | POA: Diagnosis not present

## 2018-09-02 DIAGNOSIS — R29818 Other symptoms and signs involving the nervous system: Secondary | ICD-10-CM | POA: Diagnosis not present

## 2018-09-02 DIAGNOSIS — R471 Dysarthria and anarthria: Secondary | ICD-10-CM | POA: Diagnosis present

## 2018-09-02 DIAGNOSIS — E785 Hyperlipidemia, unspecified: Secondary | ICD-10-CM | POA: Diagnosis present

## 2018-09-02 DIAGNOSIS — E1165 Type 2 diabetes mellitus with hyperglycemia: Secondary | ICD-10-CM | POA: Diagnosis present

## 2018-09-02 DIAGNOSIS — R29810 Facial weakness: Secondary | ICD-10-CM | POA: Diagnosis present

## 2018-09-02 DIAGNOSIS — R1310 Dysphagia, unspecified: Secondary | ICD-10-CM | POA: Diagnosis present

## 2018-09-02 DIAGNOSIS — I639 Cerebral infarction, unspecified: Secondary | ICD-10-CM

## 2018-09-02 DIAGNOSIS — Z7952 Long term (current) use of systemic steroids: Secondary | ICD-10-CM | POA: Diagnosis not present

## 2018-09-02 DIAGNOSIS — Z7982 Long term (current) use of aspirin: Secondary | ICD-10-CM | POA: Diagnosis not present

## 2018-09-02 DIAGNOSIS — N170 Acute kidney failure with tubular necrosis: Secondary | ICD-10-CM | POA: Diagnosis present

## 2018-09-02 DIAGNOSIS — G8194 Hemiplegia, unspecified affecting left nondominant side: Secondary | ICD-10-CM | POA: Diagnosis present

## 2018-09-02 DIAGNOSIS — G459 Transient cerebral ischemic attack, unspecified: Secondary | ICD-10-CM | POA: Diagnosis not present

## 2018-09-02 DIAGNOSIS — Z79891 Long term (current) use of opiate analgesic: Secondary | ICD-10-CM | POA: Diagnosis not present

## 2018-09-02 DIAGNOSIS — R531 Weakness: Secondary | ICD-10-CM | POA: Diagnosis not present

## 2018-09-02 DIAGNOSIS — Z66 Do not resuscitate: Secondary | ICD-10-CM | POA: Diagnosis not present

## 2018-09-02 DIAGNOSIS — I6521 Occlusion and stenosis of right carotid artery: Secondary | ICD-10-CM | POA: Diagnosis not present

## 2018-09-02 DIAGNOSIS — R29700 NIHSS score 0: Secondary | ICD-10-CM | POA: Diagnosis present

## 2018-09-02 DIAGNOSIS — Z79899 Other long term (current) drug therapy: Secondary | ICD-10-CM | POA: Diagnosis not present

## 2018-09-02 DIAGNOSIS — E7800 Pure hypercholesterolemia, unspecified: Secondary | ICD-10-CM | POA: Diagnosis present

## 2018-09-02 DIAGNOSIS — Z794 Long term (current) use of insulin: Secondary | ICD-10-CM | POA: Diagnosis not present

## 2018-09-02 DIAGNOSIS — I672 Cerebral atherosclerosis: Secondary | ICD-10-CM | POA: Diagnosis present

## 2018-09-02 DIAGNOSIS — I10 Essential (primary) hypertension: Secondary | ICD-10-CM | POA: Diagnosis not present

## 2018-09-02 DIAGNOSIS — I361 Nonrheumatic tricuspid (valve) insufficiency: Secondary | ICD-10-CM

## 2018-09-02 DIAGNOSIS — E1142 Type 2 diabetes mellitus with diabetic polyneuropathy: Secondary | ICD-10-CM | POA: Diagnosis present

## 2018-09-02 DIAGNOSIS — Z833 Family history of diabetes mellitus: Secondary | ICD-10-CM | POA: Diagnosis not present

## 2018-09-02 DIAGNOSIS — E1151 Type 2 diabetes mellitus with diabetic peripheral angiopathy without gangrene: Secondary | ICD-10-CM | POA: Diagnosis present

## 2018-09-02 DIAGNOSIS — F17210 Nicotine dependence, cigarettes, uncomplicated: Secondary | ICD-10-CM | POA: Diagnosis present

## 2018-09-02 DIAGNOSIS — Z7902 Long term (current) use of antithrombotics/antiplatelets: Secondary | ICD-10-CM | POA: Diagnosis not present

## 2018-09-02 LAB — URINALYSIS, ROUTINE W REFLEX MICROSCOPIC
Bilirubin Urine: NEGATIVE
Glucose, UA: 150 mg/dL — AB
Hgb urine dipstick: NEGATIVE
Ketones, ur: NEGATIVE mg/dL
Leukocytes, UA: NEGATIVE
Nitrite: NEGATIVE
Protein, ur: 100 mg/dL — AB
Specific Gravity, Urine: 1.018 (ref 1.005–1.030)
Squamous Epithelial / HPF: NONE SEEN (ref 0–5)
pH: 6 (ref 5.0–8.0)

## 2018-09-02 LAB — URINE DRUG SCREEN, QUALITATIVE (ARMC ONLY)
Amphetamines, Ur Screen: NOT DETECTED
Barbiturates, Ur Screen: NOT DETECTED
Benzodiazepine, Ur Scrn: NOT DETECTED
Cannabinoid 50 Ng, Ur ~~LOC~~: NOT DETECTED
Cocaine Metabolite,Ur ~~LOC~~: POSITIVE — AB
MDMA (Ecstasy)Ur Screen: NOT DETECTED
Methadone Scn, Ur: NOT DETECTED
Opiate, Ur Screen: NOT DETECTED
Phencyclidine (PCP) Ur S: NOT DETECTED
Tricyclic, Ur Screen: NOT DETECTED

## 2018-09-02 LAB — LIPID PANEL
Cholesterol: 115 mg/dL (ref 0–200)
HDL: 50 mg/dL (ref >40)
LDL Cholesterol: 52 mg/dL (ref 0–99)
Total CHOL/HDL Ratio: 2.3 RATIO
Triglycerides: 64 mg/dL (ref <150)
VLDL: 13 mg/dL (ref 0–40)

## 2018-09-02 LAB — GLUCOSE, CAPILLARY
Glucose-Capillary: 210 mg/dL — ABNORMAL HIGH (ref 70–99)
Glucose-Capillary: 167 mg/dL — ABNORMAL HIGH (ref 70–99)
Glucose-Capillary: 154 mg/dL — ABNORMAL HIGH (ref 70–99)
Glucose-Capillary: 70 mg/dL (ref 70–99)
Glucose-Capillary: 130 mg/dL — ABNORMAL HIGH (ref 70–99)
Glucose-Capillary: 132 mg/dL — ABNORMAL HIGH (ref 70–99)

## 2018-09-02 LAB — ECHOCARDIOGRAM COMPLETE
Height: 66 in
Weight: 2400 oz

## 2018-09-02 MED ORDER — CLOPIDOGREL 5 MG/ML PEDIATRIC ORAL SUSPENSION: 75.0000 mg | Freq: Every day | ORAL | Status: DC

## 2018-09-02 MED ORDER — ATORVASTATIN CALCIUM 20 MG PO TABS
80.0000 mg | ORAL_TABLET | Freq: Every evening | ORAL | Status: DC
  Administered 2018-09-02 – 2018-09-05 (×4): 80 mg via ORAL
  Filled 2018-09-02 (×4): qty 4

## 2018-09-02 NOTE — Progress Notes (Addendum)
Occupational Therapy Evaluation Patient Details Name: Evan AnoDuncan E Segundo Sr. MRN: [id number] DOB: [birth year] Today's Date: 09/02/2018    History of Present Illness Pt. Is a 63 y.o. male [facility] with left sided weakness, slurred speech, and facial droop. 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 Pt. PMHx includes: DM, Hyperlipidemia, HTN, and a prior CVA.     Clinical Impression   Pt. presents with left sided weakness, impaired balance, limited activity tolerance, and limited functional mobility which limits his ability to complete basic ADL and IADL functioning. Pt. resides at home with his wife. Pt. was independent with ADLs, and IADL functioning. Pt. was able to drive and was working some, per pt. report. Pt. Education was provided about LUE ROM, positioning, neuroplasticity, and motor return with stroke recovery, POC, and rec. Followup services. Pt. Could benefit from OT services for ADL training, A/E training, neuromuscular re-education, positioning, and pt. education about home modification, and DME in order to improve LUE engagement in daily ADLs, and IADLs to maximize independence. Recommend follow-up CIR with follow-up OT services.    Follow Up Recommendations  CIR    Equipment Recommendations       Recommendations for Other Services       Precautions / Restrictions Precautions Precautions: None Restrictions Weight Bearing Restrictions: No      Mobility Bed Mobility Overal bed mobility: Needs Assistance Bed Mobility: Supine to Sit     Supine to sit: Min assist        Transfers Overall transfer level: Needs assistance   Transfers: Sit to/from Stand Sit to Stand: Mod assist              Balance Overall balance assessment: Needs assistance   Sitting balance-Leahy Scale: Fair       Standing balance-Leahy Scale: Good                             ADL either performed or assessed  with clinical judgement   ADL Overall ADL's : Needs assistance/impaired Eating/Feeding: Set up;Bed level;Minimal assistance Eating/Feeding Details (indicate cue type and reason): Assist opening, and cutting food Grooming: Set up;Minimal assistance;Bed level Grooming Details (indicate cue type and reason): Full set required Upper Body Bathing: Set up;Moderate assistance   Lower Body Bathing: Set up;Maximal assistance   Upper Body Dressing : Moderate assistance   Lower Body Dressing: Maximal assistance                       Vision Baseline Vision/History: Wears glasses(Reports needin a new perscription.) Patient Visual Report: No change from baseline       Perception     Praxis      Pertinent Vitals/Pain Pain Assessment: No/denies pain     Hand Dominance Right   Extremity/Trunk Assessment Upper Extremity Assessment Upper Extremity Assessment: LUE deficits/detail LUE Deficits / Details: No active isolated shoulder flexion, synergistic movement with abduction, 2/5 elbow flexion, extension, 3/5 wrist extension, Pt. has active, but limited digit extension. No active thumb movement elicted.   LUE Sensation: (Intact light touch, and proprioceptive awareness.)           Communication Communication Communication: Expressive difficulties(slurred speech)   Cognition Arousal/Alertness: Awake/alert Behavior During Therapy: WFL for tasks assessed/performed Overall Cognitive Status: Within Functional Limits for tasks assessed  General Comments       Exercises     Shoulder Instructions      Home Living Family/patient expects to be discharged to:: Private residence Living Arrangements: Spouse/significant other Available Help at Discharge: Family Type of Home: Mobile home Home Access: Stairs to enter Entrance Stairs-Number of Steps: 6 steps Entrance Stairs-Rails: Right Home Layout: One level     Bathroom  Shower/Tub: IT trainer: Standard Bathroom Accessibility: Yes   Home Equipment: Cane - single point          Prior Functioning/Environment Level of Independence: Independent        Comments: Pt. reports being independent with ADLs, IADLs, working some, driving.        OT Problem List: Decreased strength;Decreased activity tolerance;Decreased knowledge of use of DME or AE;Impaired UE functional use;Decreased coordination;Decreased range of motion      OT Treatment/Interventions: Self-care/ADL training;Therapeutic exercise;Patient/family education;DME and/or AE instruction;Neuromuscular education    OT Goals(Current goals can be found in the care plan section) Acute Rehab OT Goals Patient Stated Goal: To regain use of his LUE OT Goal Formulation: With patient Potential to Achieve Goals: Good  OT Frequency: Min 3X/week   Barriers to D/C:            Co-evaluation              AM-PAC OT "6 Clicks" Daily Activity     Outcome Measure Help from another person eating meals?: A Little Help from another person taking care of personal grooming?: A Little Help from another person toileting, which includes using toliet, bedpan, or urinal?: A Lot Help from another person bathing (including washing, rinsing, drying)?: A Lot Help from another person to put on and taking off regular upper body clothing?: A Lot Help from another person to put on and taking off regular lower body clothing?: A Lot 6 Click Score: 14   End of Session    Activity Tolerance: Patient tolerated treatment well Patient left: in bed;with call bell/phone within reach;with bed alarm set  OT Visit Diagnosis: Muscle weakness (generalized) (M62.81);Hemiplegia and hemiparesis;Unsteadiness on feet (R26.81) Hemiplegia - Right/Left: Left Hemiplegia - dominant/non-dominant: Non-Dominant Hemiplegia - caused by: Cerebral infarction                Time: 1638-4665 OT Time Calculation  (min): 27 min Charges:  OT General Charges $OT Visit: 1 Visit OT Evaluation $OT Eval Moderate Complexity: 1 Mod  Olegario Messier, MS, OTR/L   Olegario Messier 09/02/2018, 10:41 AM

## 2018-09-02 NOTE — Evaluation (Signed)
Physical Therapy Evaluation Patient Details Name: Evan AnoDuncan E Mikulski Sr. MRN: [id number] DOB: 09/22/1955 Today's Date: 09/02/2018   History of Present Illness  Pt is a 63 y/o M who presented with L facial droop, L sided weakness, numbness, and dysarthria.  Head CT negative.  MRI revealed R BG lacunar infarct.  Pt's PMH includes stroke.     Clinical Impression  Pt admitted with above diagnosis. Pt currently with functional limitations due to the deficits listed below (see PT Problem List). Evan Townsend was independent PTA with no reported deficits from previous stroke.  He currently demonstrates impaired coordination, strength, and requires assist with all aspects of mobility.  Despite his impairments the pt put forth excellent effort with PT and demonstrated ability to ambulate in room with hemiwalker with assist.  Pt is very eager to improve independence and is a good candidate for CIR. Pt will benefit from skilled PT to increase their independence and safety with mobility to allow discharge to the venue listed below.      Follow Up Recommendations CIR    Equipment Recommendations  Other (comment)(Hemiwalker)    Recommendations for Other Services Speech consult;Rehab consult     Precautions / Restrictions Precautions Precautions: Fall Restrictions Weight Bearing Restrictions: No      Mobility  Bed Mobility Overal bed mobility: Needs Assistance Bed Mobility: Supine to Sit     Supine to sit: Min assist;HOB elevated     General bed mobility comments: Assist to bring LLE to EOB and to elevate trunk  Transfers Overall transfer level: Needs assistance Equipment used: Hemi-walker Transfers: Sit to/from [employer]Stand;Stand Pivot Transfers Sit to Stand: Mod assist;+2 physical assistance Stand pivot transfers: Mod assist;+2 physical assistance       General transfer comment: HHA for LUE, cues to scoot to EOB prior to sit>stand.  Assist to boost to standing with lean to L side.  Cues for  sequencing using hemiwalker to pivot.  Impaired coordination with LLE with cues for L knee bend to initiate stepping.   Ambulation/Gait Ambulation/Gait assistance: Mod assist;+2 safety/equipment Gait Distance (Feet): 15 Feet Assistive device: Hemi-walker Gait Pattern/deviations: Ataxic;Decreased step length - left;Decreased step length - right;Antalgic Gait velocity: decreased   General Gait Details: HHA for LUE. Cues for sequencing using hemiwalker.  Lean to L side.  Impaired coordination with LLE with cues for L knee bend to initiate stepping on LLE.   Stairs            Wheelchair Mobility    Modified Rankin (Stroke Patients Only) Modified Rankin (Stroke Patients Only) Pre-Morbid Rankin Score: No symptoms Modified Rankin: Moderately severe disability     Balance Overall balance assessment: Needs assistance Sitting-balance support: Feet supported;Single extremity supported Sitting balance-Leahy Scale: Poor Sitting balance - Comments: Pt relies on UE support sitting EOB. Pt requires min assist for trunk support with MMT due to posterior lean.    Standing balance support: Bilateral upper extremity supported;During functional activity Standing balance-Leahy Scale: Poor Standing balance comment: Relies on BUE support for static and dynamic activities                             Pertinent Vitals/Pain Pain Assessment: No/denies pain    Home Living Family/patient expects to be discharged to:: Private residence Living Arrangements: Spouse/significant other Available Help at Discharge: Family;Available PRN/intermittently(wife works during the day) Type of Home: Mobile home Home Access: Stairs to enter Entrance Stairs-Rails: Left Entrance Stairs-Number of Steps: 5 Home  Layout: One level Home Equipment: None      Prior Function Level of Independence: Independent         Comments: Pt reports he is on diability for previous stroke but reports not residual  deficits.  Pt ind wth ADLs and ambualtes without AD with no falls in the past 6 months.  Does not drive.       Hand Dominance   Dominant Hand: Right    Extremity/Trunk Assessment   Upper Extremity Assessment Upper Extremity Assessment: Defer to OT evaluation LUE Deficits / Details: No active isolated shoulder flexion, synergistic movement with abduction, 2/5 elbow flexion, extension, 3/5 wrist extension, Pt. has active, but limited digit extension. No active thumb movement elicted.   LUE Sensation: (Intact light touch, and proprioceptive awareness.)    Lower Extremity Assessment Lower Extremity Assessment: LLE deficits/detail LLE Deficits / Details: Impaired coordination with gross motor testing, ataxic when ambulating.  Hip F 3-/5, knee F 4-/5, knee E 3/5.   LLE Sensation: (intact light touch) LLE Coordination: decreased gross motor       Communication   Communication: Other (comment)(slurred speech)  Cognition Arousal/Alertness: Awake/alert Behavior During Therapy: Flat affect Overall Cognitive Status: No family/caregiver present to determine baseline cognitive functioning                                 General Comments: Pt disoriented to time, believes it is Nov or Dec 2020.  Otherwise pt answering all questions appropriately and following commands.       General Comments General comments (skin integrity, edema, etc.): Pt reports his vision was worsening before his this stroke and he was having a hard time seeing objects farther away.  He has not seen a doctor about this.  Pt reports his vision is unchanged since this new stroke.  Reviewed signs and symptoms of a stroke with the pt with instruction on what to do in this situation, pt verbalized understanding.     Exercises     Assessment/Plan    PT Assessment Patient needs continued PT services  PT Problem List Decreased strength;Decreased range of motion;Decreased activity tolerance;Decreased  balance;Decreased mobility;Decreased coordination;Decreased cognition;Decreased knowledge of use of DME;Decreased safety awareness       PT Treatment Interventions DME instruction;Gait training;Stair training;Functional mobility training;Therapeutic activities;Therapeutic exercise;Balance training;Neuromuscular re-education;Cognitive remediation;Patient/family education;Wheelchair mobility training    PT Goals (Current goals can be found in the Care Plan section)  Acute Rehab PT Goals Patient Stated Goal: to become as independent as possible, to go to CIR PT Goal Formulation: With patient Time For Goal Achievement: 09/16/18 Potential to Achieve Goals: Good    Frequency 7X/week   Barriers to discharge Inaccessible home environment;Decreased caregiver support Steps to enter home and wife works during the day    Co-evaluation               AM-PAC PT "6 Clicks" Mobility  Outcome Measure Help needed turning from your back to your side while in a flat bed without using bedrails?: A Little Help needed moving from lying on your back to sitting on the side of a flat bed without using bedrails?: A Little Help needed moving to and from a bed to a chair (including a wheelchair)?: A Lot Help needed standing up from a chair using your arms (e.g., wheelchair or bedside chair)?: A Lot Help needed to walk in hospital room?: A Lot Help needed climbing 3-5 steps with  a railing? : A Lot 6 Click Score: 14    End of Session Equipment Utilized During Treatment: Gait belt Activity Tolerance: Patient limited by fatigue;Patient tolerated treatment well Patient left: in chair;with call bell/phone within reach;with chair alarm set Nurse Communication: Mobility status PT Visit Diagnosis: Unsteadiness on feet (R26.81);Muscle weakness (generalized) (M62.81);Other abnormalities of gait and mobility (R26.89);Difficulty in walking, not elsewhere classified (R26.2)    Time: 4235-3614 PT Time Calculation  (min) (ACUTE ONLY): 40 min   Charges:   PT Evaluation $PT Eval Moderate Complexity: 1 Mod PT Treatments $Gait Training: 8-22 mins $Therapeutic Activity: 8-22 mins        Encarnacion Chu PT, DPT 09/02/2018, 1:13 PM

## 2018-09-02 NOTE — Progress Notes (Signed)
Speech Therapy Note: NSG contacted ST services re: wife's concern of pt coughing when eating "food". NSG reported pt tolerated swallowing Pills w/ water via straw earlier in day w/ no overt s/s of aspiration noted.  ST services will f/u w/ [facility] tomorrow AM, and in the meantime will downgrade the food consistency part of pt's diet until assessment can be completed. NSG updated.     Jerilynn Som, MS, CCC-SLP

## 2018-09-02 NOTE — Plan of Care (Signed)
  Problem: Education: Goal: Knowledge of General Education information will improve Description Including pain rating scale, medication(s)/side effects and non-pharmacologic comfort measures Outcome: Progressing   Problem: Coping: Goal: Level of anxiety will decrease Outcome: Progressing   Problem: Safety: Goal: Ability to remain free from injury will improve Outcome: Progressing   Problem: Education: Goal: Knowledge of secondary prevention will improve Outcome: Progressing

## 2018-09-02 NOTE — Progress Notes (Signed)
*  PRELIMINARY RESULTS* Echocardiogram 2D Echocardiogram has been performed.  Evan Townsend Evan Townsend 09/02/2018, 12:26 PM

## 2018-09-02 NOTE — Progress Notes (Signed)
Brief History Evan Townsend Sr. is an 63 y.o. male with past medical history of diabetes mellitus, hyperlipidemia, hypertension, prior stroke, and tobacco use presenting to the ED with chief complaints of left facial droop, left sided weakness and numbness and dysarthria since 0730 am.  Patient's wife who is the primary informant reports that patient went to bed last night at around 2300  and when she woke him up this morning at 0730 she noticed that he was not his normal self.  Patient's wife reports that he had difficulty getting out of the bed and was somewhat slurring his speech, she also noticed left facial droop and slight drooling.  When she tried to get patient out of the bed patient appeared to be wobbly and could not walk without assistance. Denies associated altered sensorium, cranial nerve deficit, seizures, diplopia, nausea or vomiting, syncope or LOC, headache, dizziness or other associated neurologic deficit.  On arrival to the ED patient was noted to have left facial droop and slurred speech.  Blood pressure was elevated at 192/93, heart rate 47, blood glucose 329.  Initial NIH stroke scale 0.  Initial CT head was negative for acute intracranial abnormality.  Labs revealed negative troponin, blood glucose 343, calcium 8.5, ALT 64, alkaline phosphate 145, normal white count, ethanol<10.  Patient was therefore admitted for further stroke work-up and management   Subjective MRI showed R BG lacunar infarct    Past Medical History Past Medical History:  Diagnosis Date  . Diabetes 1.5, managed as type 2 (HCC)   . Diabetes mellitus without complication (HCC)   . Hypercholesterolemia   . Hypertension   . Pain in both feet   . Stroke [facility])     Past Surgical History History reviewed. No pertinent surgical history.  Allergies No Known Allergies  Home Medications Medications Prior to Admission  Medication Sig Dispense Refill  . amLODipine (NORVASC) 10 MG tablet Take 10 mg by mouth  daily.    Marland Kitchen aspirin 81 MG EC tablet Take 1 tablet (81 mg total) by mouth daily. 30 tablet   . atorvastatin (LIPITOR) 40 MG tablet Take 1 tablet (40 mg total) by mouth every evening. 30 tablet 0  . gabapentin (NEURONTIN) 300 MG capsule Take 2 capsules (600 mg total) by mouth 3 (three) times daily. 180 capsule 2  . glipiZIDE (GLUCOTROL) 10 MG tablet Take 1 tablet (10 mg total) by mouth daily before breakfast. 30 tablet 3  . insulin aspart protamine- aspart (NOVOLOG MIX 70/30) (70-30) 100 UNIT/ML injection Inject 0.2 mLs (20 Units total) into the skin 2 (two) times daily with a meal. (Patient taking differently: Inject 15 Units into the skin daily with supper. ) 10 mL 11  . lisinopril (PRINIVIL,ZESTRIL) 40 MG tablet Take 1 tablet (40 mg total) by mouth daily. 30 tablet 3  . metFORMIN (GLUCOPHAGE-XR) 500 MG 24 hr tablet Take 1,000 mg by mouth 2 (two) times daily with a meal.    . traMADol (ULTRAM) 50 MG tablet Take 1 tablet (50 mg total) by mouth every 6 (six) hours as needed. 20 tablet 0  . clopidogrel (PLAVIX) 75 MG tablet Take 1 tablet (75 mg total) by mouth daily. 30 tablet 0  . HYDROcodone-acetaminophen (NORCO/VICODIN) 5-325 MG tablet Take 1 tablet by mouth every 6 (six) hours as needed for moderate pain. (Patient not taking: Reported on 09/01/2018) 12 tablet 0  . meloxicam (MOBIC) 15 MG tablet Take 1 tablet (15 mg total) by mouth daily. (Patient not taking: Reported on  09/01/2018) 30 tablet 0  . predniSONE (DELTASONE) 10 MG tablet Take 1 tablet (10 mg total) by mouth daily. 6,5,4,3,2,1 six day taper (Patient not taking: Reported on 09/01/2018) 21 tablet 0    Hospital Medications . aspirin EC  81 mg Oral Daily  . atorvastatin  40 mg Oral QPM  . clopidogrel  75 mg Oral Daily  . enoxaparin (LOVENOX) injection  40 mg Subcutaneous Q24H  . gabapentin  600 mg Oral TID  . glipiZIDE  10 mg Oral QAC breakfast  . insulin aspart  0-15 Units Subcutaneous TID WC  . insulin aspart  0-5 Units Subcutaneous QHS   . insulin detemir  10 Units Subcutaneous Daily  . lisinopril  40 mg Oral Daily  . nicotine  14 mg Transdermal Daily     Objective pt feels better   Intake/Output from previous day: 01/16 0701 - 01/17 0700 In: -  Out: 3 [Urine:2; Stool:1] Intake/Output this shift: No intake/output data recorded. Nutritional status:  Diet Order            Diet Carb Modified Fluid consistency: Thin; Room service appropriate? Yes  Diet effective now               Physical Exam -  Vitals:   09/02/18 0049 09/02/18 0431 09/02/18 0626 09/02/18 0751  BP: (!) 158/81 (!) 152/82 (!) 160/81 (!) 167/79  Pulse: (!) 54 73 73 (!) 55  Resp:  18 18 18   Temp: 99 F (37.2 C) 98.2 F (36.8 C) 97.9 F (36.6 C) 98.6 F (37 C)  TempSrc: Oral Oral  Oral  SpO2: 100% 100% 100% 100%  Weight:      Height:       General -  Heart - Regular rate and rhythm - no murmer Lungs - Clear to auscultation Abdomen - Soft - non tender Extremities - Distal pulses intact - no edema Skin - Warm and dry  Neurologic Exam:   Mental Status:  Alert, oriented, thought content appropriate. Speech dysarthric without evidence of aphasia. Able to follow 3 step commands without difficulty. Attention span and concentration seemed appropriate  Cranial Nerves: II: Discs flat bilaterally; Visual fields grossly normal, pupils equal, round, reactive to light and accommodation III,IV, VI: ptosis not present, extra-ocular motions intact bilaterally V,VII: smile asymmetric, left facial droop, facial light touch sensation decreased on the left VIII: hearing normal bilaterally IX,X: gag reflex present XI: bilateral shoulder shrug XII: midline tongue extension Motor: Right :Upper extremity 5/5Without pronator driftLeft: Upper extremity 5/5 without pronator drift Right:Lower extremity 5/5Left: Lower extremity 4/5 Tone and bulk:normal tone throughout; no atrophy  noted Sensory: Pinprick and light touch decreased on the left Deep Tendon Reflexes: 2+ and symmetric throughout Plantars: Right:muteLeft: mute Cerebellar: Finger-to-nosetesting intact bilaterally.Heel to shin testing normal bilaterally Gait: not tested due to safety concerns  LABORATORY RESULTS:  Basic Metabolic Panel: Recent Labs  Lab 09/01/18 0913  NA 138  K 3.7  CL 107  CO2 24  GLUCOSE 343*  BUN 18  CREATININE 1.08  CALCIUM 8.5*    Liver Function Tests: Recent Labs  Lab 09/01/18 0913  AST 24  ALT 64*  ALKPHOS 145*  BILITOT 0.6  PROT 6.6  ALBUMIN 3.7   No results for input(s): LIPASE, AMYLASE in the last 168 hours. No results for input(s): AMMONIA in the last 168 hours.  CBC: Recent Labs  Lab 09/01/18 0913  WBC 5.9  NEUTROABS 3.3  HGB 11.8*  HCT 35.3*  MCV 88.3  PLT 241  Cardiac Enzymes: Recent Labs  Lab 09/01/18 0913  TROPONINI <0.03    Lipid Panel: Recent Labs  Lab 09/02/18 0420  CHOL 115  TRIG 64  HDL 50  CHOLHDL 2.3  VLDL 13  LDLCALC 52    CBG: Recent Labs  Lab 09/01/18 1756 09/01/18 2112 09/02/18 0434 09/02/18 0748 09/02/18 1145  GLUCAP 98 232* 167* 154* 70    Microbiology:   Coagulation Studies: Recent Labs    09/01/18 0913  LABPROT 12.0  INR 0.89    Miscellaneous Labs:   IMAGING RESULTS Ct Head Wo Contrast  Result Date: 09/01/2018 CLINICAL DATA:  Left facial droop and left-sided weakness. Suspect stroke. EXAM: CT HEAD WITHOUT CONTRAST TECHNIQUE: Contiguous axial images were obtained from the base of the skull through the vertex without intravenous contrast. COMPARISON:  MRI head 09/10/2017, CT head 09/10/2017 FINDINGS: Brain: Ventricle size normal. Patchy hypodensity in the cerebral white matter bilaterally appears unchanged. Small chronic infarct in the left thalamus unchanged. Negative for acute infarct, hemorrhage, or mass. Vascular: Negative for hyperdense vessel Skull:  Negative Sinuses/Orbits: Mucosal edema right maxillary sinus.  Normal orbit Other: None IMPRESSION: No acute intracranial abnormality. Chronic microvascular ischemic changes similar to the prior study. Electronically Signed   By: Marlan Palau M.D.   On: 09/01/2018 09:23   Mr Brain Wo Contrast  Result Date: 09/01/2018 CLINICAL DATA:  63 year old male presents with new left side weakness facial droop and slurred speech. EXAM: MRI HEAD WITHOUT CONTRAST MRA HEAD WITHOUT CONTRAST TECHNIQUE: Multiplanar, multiecho pulse sequences of the brain and surrounding structures were obtained without intravenous contrast. Angiographic images of the head were obtained using MRA technique without contrast. COMPARISON:  Brain MRI and intracranial MRA 09/10/2017 and earlier. FINDINGS: MRI HEAD FINDINGS Brain: Linear 12 millimeter acute lacunar infarct of the posterior limb right internal capsule (series 5, image 27). Mild T2 and FLAIR hyperintensity without hemorrhage or mass effect. No other restricted diffusion. Expected evolution of the January 2019 left thalamic lacune, and multiple chronic lacunar infarcts in the bilateral deep gray matter and corona radiata. Occasional chronic micro hemorrhages. Hemosiderin also in the right periatrial white matter. Chronic lacune suspected also in the left midbrain. No cortical encephalomalacia. No midline shift, mass effect, evidence of mass lesion, ventriculomegaly, extra-axial collection or acute intracranial hemorrhage. Cervicomedullary junction and pituitary are within normal limits. Vascular: Major intracranial vascular flow voids are stable. Skull and upper cervical spine: Negative visible cervical spine. Visualized bone marrow signal is within normal limits. Sinuses/Orbits: Bilateral maxillary sinus mucosal thickening. Negative orbits. Other: Mastoids are clear. Visible internal auditory structures appear normal. Scalp and face soft tissues appear negative. MRA HEAD FINDINGS  Antegrade flow in the posterior circulation with stable, negative distal vertebral arteries and basilar. The distal left vertebral is mildly dominant. Patent bilateral PICA origins. Patent SCA and PCA origins. Fetal type right PCA with left posterior communicating artery also present. Mild bilateral PCA branch irregularity has not significantly changed. Antegrade flow in both ICA siphons appears stable. There is widespread siphon irregularity in keeping with atherosclerosis. Mild bilateral supraclinoid segment stenosis. Normal ophthalmic artery origins. Patent carotid termini. Normal MCA and ACA origins. Stable bilateral MCA and ACA branches with mild irregularity throughout. Negative anterior communicating artery. IMPRESSION: 1. Positive for acute lacunar infarct of the posterior limb right internal capsule. No associated hemorrhage or mass effect. 2. Underlying advanced chronic small vessel disease otherwise stable since January 2019. Occasional associated chronic micro-hemorrhages. 3. Stable intracranial MRA since 2019 with widespread intracranial atherosclerosis but  no hemodynamically significant stenosis. Electronically Signed   By: Odessa Fleming M.D.   On: 09/01/2018 15:41   US Carotid Bilateral (at [facility] Only)  Result Date: 09/01/2018 CLINICAL DATA:  Stroke/TIA. History of hypertension, hyperlipidemia, diabetes and smoking EXAM: BILATERAL CAROTID DUPLEX ULTRASOUND TECHNIQUE: Wallace Cullens scale imaging, color Doppler and duplex ultrasound were performed of bilateral carotid and vertebral arteries in the neck. COMPARISON:  None. FINDINGS: Criteria: Quantification of carotid stenosis is based on velocity parameters that correlate the residual internal carotid diameter with NASCET-based stenosis levels, using the diameter of the distal internal carotid lumen as the denominator for stenosis measurement. The following velocity measurements were obtained: RIGHT ICA: 94/29 cm/sec CCA: 85/18 cm/sec SYSTOLIC ICA/CCA RATIO:   1.1 ECA: 97 cm/sec LEFT ICA: 85/31 cm/sec CCA: 87/20 cm/sec SYSTOLIC ICA/CCA RATIO:  1.2 ECA: 65 cm/sec RIGHT CAROTID ARTERY: There is a minimal to moderate amount of intimal thickening/hypoechoic plaque within the right carotid bulb (images 15 and 18), extending to involve the origin and proximal aspects of the right internal carotid artery (image 22), not resulting in elevated peak systolic velocities within the interrogated course the right internal carotid artery to suggest a hemodynamically significant stenosis. RIGHT VERTEBRAL ARTERY:  Antegrade flow LEFT CAROTID ARTERY: There is no grayscale evidence of significant intimal thickening or atherosclerotic plaque affecting the interrogated portions of the left carotid system. There are no elevated peak systolic velocities within the interrogated course of the left internal carotid artery to suggest a hemodynamically significant stenosis. LEFT VERTEBRAL ARTERY:  Antegrade Flow IMPRESSION: 1. Minimal to moderate amount of right-sided atherosclerotic plaque, not resulting in a hemodynamically significant stenosis. 2. Normal sonographic evaluation of the left carotid system. Electronically Signed   By: Simonne Come M.D.   On: 09/01/2018 12:12   Mr Maxine Glenn Head/brain QR Cm  Result Date: 09/01/2018 CLINICAL DATA:  63 year old male presents with new left side weakness facial droop and slurred speech. EXAM: MRI HEAD WITHOUT CONTRAST MRA HEAD WITHOUT CONTRAST TECHNIQUE: Multiplanar, multiecho pulse sequences of the brain and surrounding structures were obtained without intravenous contrast. Angiographic images of the head were obtained using MRA technique without contrast. COMPARISON:  Brain MRI and intracranial MRA 09/10/2017 and earlier. FINDINGS: MRI HEAD FINDINGS Brain: Linear 12 millimeter acute lacunar infarct of the posterior limb right internal capsule (series 5, image 27). Mild T2 and FLAIR hyperintensity without hemorrhage or mass effect. No other restricted  diffusion. Expected evolution of the January 2019 left thalamic lacune, and multiple chronic lacunar infarcts in the bilateral deep gray matter and corona radiata. Occasional chronic micro hemorrhages. Hemosiderin also in the right periatrial white matter. Chronic lacune suspected also in the left midbrain. No cortical encephalomalacia. No midline shift, mass effect, evidence of mass lesion, ventriculomegaly, extra-axial collection or acute intracranial hemorrhage. Cervicomedullary junction and pituitary are within normal limits. Vascular: Major intracranial vascular flow voids are stable. Skull and upper cervical spine: Negative visible cervical spine. Visualized bone marrow signal is within normal limits. Sinuses/Orbits: Bilateral maxillary sinus mucosal thickening. Negative orbits. Other: Mastoids are clear. Visible internal auditory structures appear normal. Scalp and face soft tissues appear negative. MRA HEAD FINDINGS Antegrade flow in the posterior circulation with stable, negative distal vertebral arteries and basilar. The distal left vertebral is mildly dominant. Patent bilateral PICA origins. Patent SCA and PCA origins. Fetal type right PCA with left posterior communicating artery also present. Mild bilateral PCA branch irregularity has not significantly changed. Antegrade flow in both ICA siphons appears stable. There is widespread  siphon irregularity in keeping with atherosclerosis. Mild bilateral supraclinoid segment stenosis. Normal ophthalmic artery origins. Patent carotid termini. Normal MCA and ACA origins. Stable bilateral MCA and ACA branches with mild irregularity throughout. Negative anterior communicating artery. IMPRESSION: 1. Positive for acute lacunar infarct of the posterior limb right internal capsule. No associated hemorrhage or mass effect. 2. Underlying advanced chronic small vessel disease otherwise stable since January 2019. Occasional associated chronic micro-hemorrhages. 3. Stable  intracranial MRA since 2019 with widespread intracranial atherosclerosis but no hemodynamically significant stenosis. Electronically Signed   By: Odessa FlemingH  Hall M.D.   On: 09/01/2018 15:41     EEG  Transthoracic Echocardiogram  Carotid Dopplers   Assessment/Plan:  62 y.o.malewith past medical history of diabetes mellitus, hyperlipidemia woke up yesterday with L weakness, MRI showed acute R BG lacunar stroke, MRA showed stable diffuse intracranial Atherosclerosis disease   -Due to intracranial atherosclerosis will put patient on dual anti platelets therapy for 90 days then will keep on ASA life long, add Plavix for now, continue ASA and statin -ECHO pending  -PT/OT and speech evaluation -Out patient neurology follow up     Jonna Munroarek Lindsey Demonte, MD

## 2018-09-02 NOTE — Progress Notes (Signed)
Rehab Admissions Coordinator Note:  Per PT, OT and SLP recommendation, Patient was screened by Nanine Means for appropriateness for an Inpatient Acute Rehab.   At this time, it appears the pt is a good candidate for CIR. AC will follow up with pt on Monday regarding level of interest and for possible admit.   I will place an IP Rehab Consult Order in the chart as means to follow the patient.   Nanine Means 09/02/2018, 3:05 PM  I can be reached at [phone].

## 2018-09-02 NOTE — Plan of Care (Signed)
  Problem: Education: Goal: Knowledge of General Education information will improve Description Including pain rating scale, medication(s)/side effects and non-pharmacologic comfort measures Outcome: Progressing   Problem: Activity: Goal: Risk for activity intolerance will decrease Outcome: Progressing   Problem: Education: Goal: Knowledge of secondary prevention will improve Outcome: Progressing Goal: Knowledge of patient specific risk factors addressed and post discharge goals established will improve Outcome: Progressing

## 2018-09-02 NOTE — Progress Notes (Signed)
[facility] - [place] at [facility]   PATIENT NAME: Evan Townsend    MR#:  [id number]  DATE OF BIRTH:  1956/05/17  SUBJECTIVE:  CHIEF COMPLAINT:   Chief Complaint  Patient presents with  . left sided weakness   Continues to have left-sided weakness.  Dysarthria.  Worked with physical therapy.  REVIEW OF SYSTEMS:    Review of Systems  Constitutional: Positive for malaise/fatigue. Negative for chills and fever.  HENT: Negative for sore throat.   Eyes: Negative for blurred vision, double vision and pain.  Respiratory: Negative for cough, hemoptysis, shortness of breath and wheezing.   Cardiovascular: Negative for chest pain, palpitations, orthopnea and leg swelling.  Gastrointestinal: Negative for abdominal pain, constipation, diarrhea, heartburn, nausea and vomiting.  Genitourinary: Negative for dysuria and hematuria.  Musculoskeletal: Negative for back pain and joint pain.  Skin: Negative for rash.  Neurological: Positive for speech change and focal weakness. Negative for sensory change and headaches.  Endo/Heme/Allergies: Does not bruise/bleed easily.  Psychiatric/Behavioral: Negative for depression. The patient is not nervous/anxious.     DRUG ALLERGIES:  No Known Allergies  VITALS:  Blood pressure (!) 158/76, pulse (!) 54, temperature 98.6 F (37 C), temperature source Oral, resp. rate 18, height 5\' 6"  (1.676 m), weight 68 kg, SpO2 100 %.  PHYSICAL EXAMINATION:   Physical Exam  GENERAL:  63 y.o.-year-old patient lying in the bed with no acute distress.  EYES: Pupils equal, round, reactive to light and accommodation. No scleral icterus. Extraocular muscles intact.  HEENT: Head atraumatic, normocephalic. Oropharynx and nasopharynx clear.  NECK:  Supple, no jugular venous distention. No thyroid enlargement, no tenderness.  LUNGS: Normal breath sounds bilaterally, no wheezing, rales, rhonchi. No use of accessory muscles of respiration.  CARDIOVASCULAR:  S1, S2 normal. No murmurs, rubs, or gallops.  ABDOMEN: Soft, nontender, nondistended. Bowel sounds present. No organomegaly or mass.  EXTREMITIES: No cyanosis, clubbing or edema b/l.    NEUROLOGIC: Right facial droop.  Left upper extremity 1/5 motor strength.  Left lower extremity 4/5.  Right sided motor strength normal.  Sensations intact PSYCHIATRIC: The patient is alert and oriented x 3.  SKIN: No obvious rash, lesion, or ulcer.   LABORATORY PANEL:   CBC Recent Labs  Lab 09/01/18 0913  WBC 5.9  HGB 11.8*  HCT 35.3*  PLT 241   ------------------------------------------------------------------------------------------------------------------ Chemistries  Recent Labs  Lab 09/01/18 0913  NA 138  K 3.7  CL 107  CO2 24  GLUCOSE 343*  BUN 18  CREATININE 1.08  CALCIUM 8.5*  AST 24  ALT 64*  ALKPHOS 145*  BILITOT 0.6   ------------------------------------------------------------------------------------------------------------------  Cardiac Enzymes Recent Labs  Lab 09/01/18 0913  TROPONINI <0.03   ------------------------------------------------------------------------------------------------------------------  RADIOLOGY:  Ct Head Wo Contrast  Result Date: 09/01/2018 CLINICAL DATA:  Left facial droop and left-sided weakness. Suspect stroke. EXAM: CT HEAD WITHOUT CONTRAST TECHNIQUE: Contiguous axial images were obtained from the base of the skull through the vertex without intravenous contrast. COMPARISON:  MRI head 09/10/2017, CT head 09/10/2017 FINDINGS: Brain: Ventricle size normal. Patchy hypodensity in the cerebral white matter bilaterally appears unchanged. Small chronic infarct in the left thalamus unchanged. Negative for acute infarct, hemorrhage, or mass. Vascular: Negative for hyperdense vessel Skull: Negative Sinuses/Orbits: Mucosal edema right maxillary sinus.  Normal orbit Other: None IMPRESSION: No acute intracranial abnormality. Chronic microvascular ischemic  changes similar to the prior study. Electronically Signed   By: Marlan Palauharles  Clark M.D.   On: 09/01/2018 09:23   Mr Brain  Wo Contrast  Result Date: 09/01/2018 CLINICAL DATA:  63 year old male presents with new left side weakness facial droop and slurred speech. EXAM: MRI HEAD WITHOUT CONTRAST MRA HEAD WITHOUT CONTRAST TECHNIQUE: Multiplanar, multiecho pulse sequences of the brain and surrounding structures were obtained without intravenous contrast. Angiographic images of the head were obtained using MRA technique without contrast. COMPARISON:  Brain MRI and intracranial MRA 09/10/2017 and earlier. FINDINGS: MRI HEAD FINDINGS Brain: Linear 12 millimeter acute lacunar infarct of the posterior limb right internal capsule (series 5, image 27). Mild T2 and FLAIR hyperintensity without hemorrhage or mass effect. No other restricted diffusion. Expected evolution of the January 2019 left thalamic lacune, and multiple chronic lacunar infarcts in the bilateral deep gray matter and corona radiata. Occasional chronic micro hemorrhages. Hemosiderin also in the right periatrial white matter. Chronic lacune suspected also in the left midbrain. No cortical encephalomalacia. No midline shift, mass effect, evidence of mass lesion, ventriculomegaly, extra-axial collection or acute intracranial hemorrhage. Cervicomedullary junction and pituitary are within normal limits. Vascular: Major intracranial vascular flow voids are stable. Skull and upper cervical spine: Negative visible cervical spine. Visualized bone marrow signal is within normal limits. Sinuses/Orbits: Bilateral maxillary sinus mucosal thickening. Negative orbits. Other: Mastoids are clear. Visible internal auditory structures appear normal. Scalp and face soft tissues appear negative. MRA HEAD FINDINGS Antegrade flow in the posterior circulation with stable, negative distal vertebral arteries and basilar. The distal left vertebral is mildly dominant. Patent bilateral PICA  origins. Patent SCA and PCA origins. Fetal type right PCA with left posterior communicating artery also present. Mild bilateral PCA branch irregularity has not significantly changed. Antegrade flow in both ICA siphons appears stable. There is widespread siphon irregularity in keeping with atherosclerosis. Mild bilateral supraclinoid segment stenosis. Normal ophthalmic artery origins. Patent carotid termini. Normal MCA and ACA origins. Stable bilateral MCA and ACA branches with mild irregularity throughout. Negative anterior communicating artery. IMPRESSION: 1. Positive for acute lacunar infarct of the posterior limb right internal capsule. No associated hemorrhage or mass effect. 2. Underlying advanced chronic small vessel disease otherwise stable since January 2019. Occasional associated chronic micro-hemorrhages. 3. Stable intracranial MRA since 2019 with widespread intracranial atherosclerosis but no hemodynamically significant stenosis. Electronically Signed   By: Odessa Fleming M.D.   On: 09/01/2018 15:41   US Carotid Bilateral (at [facility] And [facility] Only)  Result Date: 09/01/2018 CLINICAL DATA:  Stroke/TIA. History of hypertension, hyperlipidemia, diabetes and smoking EXAM: BILATERAL CAROTID DUPLEX ULTRASOUND TECHNIQUE: Wallace Cullens scale imaging, color Doppler and duplex ultrasound were performed of bilateral carotid and vertebral arteries in the neck. COMPARISON:  None. FINDINGS: Criteria: Quantification of carotid stenosis is based on velocity parameters that correlate the residual internal carotid diameter with NASCET-based stenosis levels, using the diameter of the distal internal carotid lumen as the denominator for stenosis measurement. The following velocity measurements were obtained: RIGHT ICA: 94/29 cm/sec CCA: 85/18 cm/sec SYSTOLIC ICA/CCA RATIO:  1.1 ECA: 97 cm/sec LEFT ICA: 85/31 cm/sec CCA: 87/20 cm/sec SYSTOLIC ICA/CCA RATIO:  1.2 ECA: 65 cm/sec RIGHT CAROTID ARTERY: There is a minimal to moderate amount of  intimal thickening/hypoechoic plaque within the right carotid bulb (images 15 and 18), extending to involve the origin and proximal aspects of the right internal carotid artery (image 22), not resulting in elevated peak systolic velocities within the interrogated course the right internal carotid artery to suggest a hemodynamically significant stenosis. RIGHT VERTEBRAL ARTERY:  Antegrade flow LEFT CAROTID ARTERY: There is no grayscale evidence of significant intimal thickening or atherosclerotic  plaque affecting the interrogated portions of the left carotid system. There are no elevated peak systolic velocities within the interrogated course of the left internal carotid artery to suggest a hemodynamically significant stenosis. LEFT VERTEBRAL ARTERY:  Antegrade Flow IMPRESSION: 1. Minimal to moderate amount of right-sided atherosclerotic plaque, not resulting in a hemodynamically significant stenosis. 2. Normal sonographic evaluation of the left carotid system. Electronically Signed   By: Simonne Come M.D.   On: 09/01/2018 12:12   Mr Maxine Glenn Head/brain DG Cm  Result Date: 09/01/2018 CLINICAL DATA:  63 year old male presents with new left side weakness facial droop and slurred speech. EXAM: MRI HEAD WITHOUT CONTRAST MRA HEAD WITHOUT CONTRAST TECHNIQUE: Multiplanar, multiecho pulse sequences of the brain and surrounding structures were obtained without intravenous contrast. Angiographic images of the head were obtained using MRA technique without contrast. COMPARISON:  Brain MRI and intracranial MRA 09/10/2017 and earlier. FINDINGS: MRI HEAD FINDINGS Brain: Linear 12 millimeter acute lacunar infarct of the posterior limb right internal capsule (series 5, image 27). Mild T2 and FLAIR hyperintensity without hemorrhage or mass effect. No other restricted diffusion. Expected evolution of the January 2019 left thalamic lacune, and multiple chronic lacunar infarcts in the bilateral deep gray matter and corona radiata.  Occasional chronic micro hemorrhages. Hemosiderin also in the right periatrial white matter. Chronic lacune suspected also in the left midbrain. No cortical encephalomalacia. No midline shift, mass effect, evidence of mass lesion, ventriculomegaly, extra-axial collection or acute intracranial hemorrhage. Cervicomedullary junction and pituitary are within normal limits. Vascular: Major intracranial vascular flow voids are stable. Skull and upper cervical spine: Negative visible cervical spine. Visualized bone marrow signal is within normal limits. Sinuses/Orbits: Bilateral maxillary sinus mucosal thickening. Negative orbits. Other: Mastoids are clear. Visible internal auditory structures appear normal. Scalp and face soft tissues appear negative. MRA HEAD FINDINGS Antegrade flow in the posterior circulation with stable, negative distal vertebral arteries and basilar. The distal left vertebral is mildly dominant. Patent bilateral PICA origins. Patent SCA and PCA origins. Fetal type right PCA with left posterior communicating artery also present. Mild bilateral PCA branch irregularity has not significantly changed. Antegrade flow in both ICA siphons appears stable. There is widespread siphon irregularity in keeping with atherosclerosis. Mild bilateral supraclinoid segment stenosis. Normal ophthalmic artery origins. Patent carotid termini. Normal MCA and ACA origins. Stable bilateral MCA and ACA branches with mild irregularity throughout. Negative anterior communicating artery. IMPRESSION: 1. Positive for acute lacunar infarct of the posterior limb right internal capsule. No associated hemorrhage or mass effect. 2. Underlying advanced chronic small vessel disease otherwise stable since January 2019. Occasional associated chronic micro-hemorrhages. 3. Stable intracranial MRA since 2019 with widespread intracranial atherosclerosis but no hemodynamically significant stenosis. Electronically Signed   By: Odessa Fleming M.D.   On:  09/01/2018 15:41     ASSESSMENT AND PLAN:   #Acute right basal ganglia CVA On aspirin and Plavix. Appreciate neurology input.  Carotid Dopplers with nothing significant.  Waiting on echocardiogram. Continue neurochecks.  Telemetry monitoring. Worked with physical therapy and Occupational Therapy.  Inpatient rehab recommended.  Waiting for screening.  # Insulin requiring diabetes mellitus Levemir 10 units subcu every 24 hours and moderate sliding scale insulin  # Hypertension-blood pressure elevated Will hold amlodipine and continue lisinopril as blood pressure is really high Will allow permissive hypertension  # Hyperlipidemia continue statin check fasting lipid panel  # Tobacco abuse disorder  Counseled to quit smoking on admission.  All the records are reviewed and case discussed with Care Management/Social  Worker Management plans discussed with the patient, family and they are in agreement.  CODE STATUS: DO NOT RESUSCITATE  DVT Prophylaxis: SCDs  TOTAL TIME TAKING CARE OF THIS PATIENT: 35 minutes.   POSSIBLE D/C IN 1-2 DAYS, DEPENDING ON CLINICAL CONDITION.  Molinda Bailiff Napolean Sia M.D on 09/02/2018 at 2:13 PM  Between 7am to 6pm - Pager - [phone]  After 6pm go to [email or web address] - password [email or web address]  [facility] [place] [facility]  Office  [phone]  CC: Primary care physician; Patient, No Pcp Per  Note: This dictation was prepared with Dragon dictation along with smaller phrase technology. Any transcriptional errors that result from this process are unintentional.

## 2018-09-02 NOTE — Care Management Note (Signed)
Case Management Note  Patient Details  Name: Evan Townsend. MRN: [id number] Date of Birth: 10/31/1955  Subjective/Objective:   Patient is from home with wife.  Admitted with left sided facial drooping and weakness.  History of CVA; takes plavix.  Denies difficulties obtaining medications at [facility] and [facility].  Does not have a PCP.  He lives in [place] and would like to establish somewhere in [place].  This RNCM called [facility] to make a new patient appointment.  They explained that the patient needs to call his case worker and have his primary care provided changed to them prior to them being able to schedule.  Explained to patient and will explain to wife when she calls this RNCM back.  Patient is being referred to [facility].  Notified Tresa Endo at [facility] of referral.                 Action/Plan:   Expected Discharge Date:                  Expected Discharge Plan:  IP Rehab Facility  In-House Referral:     Discharge planning Services  CM Consult  Post Acute Care Choice:    Choice offered to:     DME Arranged:    DME Agency:     HH Arranged:    HH Agency:     Status of Service:  In process, will continue to follow  If discussed at Long Length of Stay Meetings, dates discussed:    Additional Comments:  Sherren Kerns, RN 09/02/2018, 1:53 PM

## 2018-09-02 NOTE — Progress Notes (Signed)
Patient's wife stated that this morning patient had a choking episode with his food. Upon my assessment patient was able to swallow his pills whole in water with no issues. Speech was contacted to inform of this since he did pass the [employer] Screen on admission. Will continue to monitor patient.

## 2018-09-02 NOTE — Plan of Care (Signed)
  Problem: Health Behavior/Discharge Planning: Goal: Ability to manage health-related needs will improve Outcome: Progressing   Problem: Safety: Goal: Ability to remain free from injury will improve Outcome: Progressing   

## 2018-09-02 NOTE — Evaluation (Signed)
Speech Language Pathology Evaluation Patient Details Name: Evan AnoDuncan E Buczynski Sr. MRN: [id number] DOB: 1955/10/19 Today's Date: 09/02/2018 Time: [id number] SLP Time Calculation (min) (ACUTE ONLY): 45 min  Problem List:  Patient Active Problem List   Diagnosis Date Noted  . TIA (transient ischemic attack) 09/01/2018  . Left leg pain 01/13/2018  . CVA (cerebral vascular accident) (HCC) 09/10/2017  . Hyperlipidemia 02/11/2017  . Acute CVA (cerebrovascular accident) (HCC) 11/10/2016  . Left sided numbness 11/10/2016  . Slurred speech 11/10/2016  . Essential hypertension 11/10/2016  . Diabetes (HCC) 11/10/2016   Past Medical History:  Past Medical History:  Diagnosis Date  . Diabetes 1.5, managed as type 2 (HCC)   . Diabetes mellitus without complication (HCC)   . Hypercholesterolemia   . Hypertension   . Pain in both feet   . Stroke [facility](HCC)    Past Surgical History: History reviewed. No pertinent surgical history. HPI:  HPI on admission 09/01/2018: "Evan Townsend  is a 63 y.o. male with a known history of CVA in the past with no residual deficits, insulin requiring diabetes mellitus, hypertension, hyperlipidemia is presenting to the ED with a chief complaint of left facial droop and left-sided weakness associated with slurry speech which was noted upon awakening this morning at around 7 AM.  Patient was in his usual state of health until last night at 11 PM when he felt asleep.  CT head is negative and patient has passed bedside swallow evaluation.  During my examination patient still has slurry speech and left leg weakness.  No family members at bedside.  Denies any chest pain or shortness of breath.  He admits drinking and last drink was on January 1.  Continues to smoke."   Assessment / Plan / Recommendation Clinical Impression  The patient is presenting with moderate dysarthria of speech characterized primarily by imprecise articulation.  His speech is largely unintelligible without cues.   Given cues and model to speak slower/louder and over-articulate, speech is 75% intelligible.  In addition, the patient appears to have cognitive communication deficits per screening with the MOCA.  The patient scored 14/30, exhibiting difficulty in the realms of visuospatial skills, executive function, attention, repetition, word fluency, abstraction and memory.  The patient will benefit from continued speech therapy in his next setting for treatment of dysarthria and ongoing assessment of cognitive communication needs.    SLP Assessment  SLP Recommendation/Assessment: Patient needs continued Speech Lanaguage Pathology Services SLP Visit Diagnosis: Dysarthria and anarthria (R47.1);Cognitive communication deficit (R41.841)    Follow Up Recommendations  Inpatient Rehab    Frequency and Duration min 2x/week  1 week      SLP Evaluation Cognition  Overall Cognitive Status: Impaired/Different from baseline Arousal/Alertness: Awake/alert Orientation Level: Oriented X4 Attention: Alternating;Divided;Selective;Sustained Sustained Attention: Impaired Behaviors: Impulsive       Comprehension  Auditory Comprehension Overall Auditory Comprehension: Appears within functional limits for tasks assessed    Expression Expression Primary Mode of Expression: Verbal Verbal Expression Overall Verbal Expression: Appears within functional limits for tasks assessed Written Expression Dominant Hand: Right   Oral / Motor  Oral Motor/Sensory Function Overall Oral Motor/Sensory Function: Moderate impairment Facial ROM: Reduced left Facial Symmetry: Abnormal symmetry left Facial Strength: Reduced left Facial Sensation: Reduced left Lingual ROM: Within Functional Limits Lingual Symmetry: Within Functional Limits Lingual Strength: Within Functional Limits Lingual Sensation: Within Functional Limits Velum: Within Functional Limits Mandible: Within Functional Limits Motor Speech Overall Motor Speech:  Impaired Respiration: Within functional limits Phonation: Normal Resonance: Within functional limits  Articulation: Impaired Level of Impairment: Word Intelligibility: Intelligibility reduced Word: 25-49% accurate Phrase: 25-49% accurate Motor Planning: Witnin functional limits   GO                   Dollene Primrose, MS/CCC- SLP  Tedd Sias, Lynnell Dike 09/02/2018, 1:45 PM

## 2018-09-03 LAB — GLUCOSE, CAPILLARY
Glucose-Capillary: 114 mg/dL — ABNORMAL HIGH (ref 70–99)
Glucose-Capillary: 154 mg/dL — ABNORMAL HIGH (ref 70–99)
Glucose-Capillary: 160 mg/dL — ABNORMAL HIGH (ref 70–99)
Glucose-Capillary: 174 mg/dL — ABNORMAL HIGH (ref 70–99)
Glucose-Capillary: 278 mg/dL — ABNORMAL HIGH (ref 70–99)

## 2018-09-03 LAB — HIV ANTIBODY (ROUTINE TESTING W REFLEX): HIV Screen 4th Generation wRfx: NONREACTIVE

## 2018-09-03 MED ORDER — POLYVINYL ALCOHOL 1.4 % OP SOLN
1.0000 [drp] | OPHTHALMIC | Status: DC | PRN
  Administered 2018-09-03: 1 [drp] via OPHTHALMIC
  Filled 2018-09-03: qty 15

## 2018-09-03 MED ORDER — SODIUM CHLORIDE 0.9% FLUSH
10.0000 mL | Freq: Two times a day (BID) | INTRAVENOUS | Status: DC
  Administered 2018-09-03 – 2018-09-05 (×4): 10 mL via INTRAVENOUS

## 2018-09-03 NOTE — Plan of Care (Signed)
Patient has ride side weakness. Patient is illiterate, ask patient to recite the words on the neuro cards for fluency. Patient wear eye glasses, ask family to bring to assess for any visual disturbances.

## 2018-09-03 NOTE — Evaluation (Signed)
Clinical/Bedside Swallow Evaluation Patient Details  Name: Evan MERKLINGER Sr. MRN: [id number] Date of Birth: 09/14/1955  Today's Date: 09/03/2018 Time: SLP Start Time (ACUTE ONLY): 0840 SLP Stop Time (ACUTE ONLY): 0910 SLP Time Calculation (min) (ACUTE ONLY): 30 min  Past Medical History:  Past Medical History:  Diagnosis Date  . Diabetes 1.5, managed as type 2 (HCC)   . Diabetes mellitus without complication (HCC)   . Hypercholesterolemia   . Hypertension   . Pain in both feet   . Stroke [facility])    Past Surgical History: History reviewed. No pertinent surgical history. HPI:  HPI on admission 09/01/2018: "Evan Townsend  is a 63 y.o. male with a known history of CVA in the past with no residual deficits, insulin requiring diabetes mellitus, hypertension, hyperlipidemia is presenting to the ED with a chief complaint of left facial droop and left-sided weakness associated with slurry speech which was noted upon awakening this morning at around 7 AM.  Patient was in his usual state of health until last night at 11 PM when he felt asleep.  CT head is negative and patient has passed bedside swallow evaluation.  During my examination patient still has slurry speech and left leg weakness.  No family members at bedside.  Denies any chest pain or shortness of breath.  He admits drinking and last drink was on January 1.  Continues to smoke."   Assessment / Plan / Recommendation Clinical Impression  pt appears to present with a pharyngeal dysphagia as characterized by prolonged mastication, and coughing immediatly after intake of thin liquids via cup and straw. pt had coughing post 3 sips of thin utilizing straw with pt notably taking large sips via straw. SLP removed straw and pt attempted intake of thin via cup. pt continued to have strong cough post sips via cup. pt did not cough with nectar or honey thick liquids. pt is within functional limits during the session with all textures of solids. pt had no  coughing noted with any solid texture. pt does have a noted prolonged mastication. pt and wife educated on safety and diet choices, pt and wife requested to remain on dysphagia 2 diet. Slp educated that pt would be decreased  to a nectar thick liquid diet and ratilnale. pt and wife gave verbal agreement and understanding.  SLP educated RN on plan and received verbal understanding.  SLP Visit Diagnosis: Dysphagia, pharyngeal phase (R13.13)    Aspiration Risk  Mild aspiration risk    Diet Recommendation     Liquid Administration via: Cup Medication Administration: Whole meds with puree Supervision: Patient able to self feed Compensations: Slow rate;Small sips/bites;Follow solids with liquid Postural Changes: Seated upright at 90 degrees    Other  Recommendations Oral Care Recommendations: Oral care BID   Follow up Recommendations        Frequency and Duration min 3x week  2 weeks       Prognosis Prognosis for Safe Diet Advancement: Good      Swallow Study   General Date of Onset: 09/02/18 HPI: HPI on admission 09/01/2018: "Evan Townsend  is a 63 y.o. male with a known history of CVA in the past with no residual deficits, insulin requiring diabetes mellitus, hypertension, hyperlipidemia is presenting to the ED with a chief complaint of left facial droop and left-sided weakness associated with slurry speech which was noted upon awakening this morning at around 7 AM.  Patient was in his usual state of health until last night at 11  PM when he felt asleep.  CT head is negative and patient has passed bedside swallow evaluation.  During my examination patient still has slurry speech and left leg weakness.  No family members at bedside.  Denies any chest pain or shortness of breath.  He admits drinking and last drink was on January 1.  Continues to smoke." Type of Study: Bedside Swallow Evaluation Diet Prior to this Study: Dysphagia 2 (chopped);Nectar-thick liquids Temperature Spikes Noted:  No Respiratory Status: Room air History of Recent Intubation: No Behavior/Cognition: Alert;Cooperative;Pleasant mood Oral Cavity Assessment: Within Functional Limits Oral Care Completed by SLP: No Oral Cavity - Dentition: Poor condition Self-Feeding Abilities: Able to feed self Patient Positioning: Upright in bed Baseline Vocal Quality: Normal Volitional Cough: Strong Volitional Swallow: Able to elicit    Oral/Motor/Sensory Function     Ice Chips Ice chips: Within functional limits Presentation: Self Fed;Spoon   Thin Liquid Thin Liquid: Impaired Presentation: Cup;Straw Oral Phase Functional Implications: Prolonged oral transit Pharyngeal  Phase Impairments: Wet Vocal Quality;Cough - Immediate    Nectar Thick Nectar Thick Liquid: Within functional limits   Honey Thick Honey Thick Liquid: Within functional limits   Puree Puree: Within functional limits Presentation: Self Fed;Spoon   Solid     Solid: Within functional limits Presentation: Self Fed;Spoon      Meredith Pel Sauber 09/03/2018,10:01 AM

## 2018-09-03 NOTE — Progress Notes (Signed)
Physical Therapy Treatment Patient Details Name: Evan AnoDuncan E Henion Sr. MRN: [id number] DOB: [birth year] Today's Date: 09/03/2018    History of Present Illness Pt is a 63 y/o M who presented with L facial droop, L sided weakness, numbness, and dysarthria.  Head CT negative.  MRI revealed R BG lacunar infarct.  Pt's PMH includes stroke.     PT Comments    Pt asleep but awakens easily for session.  Participated in exercises as described below.  Pt with good effort during exercises but needs min assist at times to complete full range on L and for control.  He is able to clear both hip with bridges.  To edge of bed with min a x 1.  He requires verbal and tactile cues for hand placements to improve technique and is receptive to education.  Once sitting he reports no dizziness and is able to sit unsupported.  Stood with min a x 2.  He increases his ambulation distance today to 7130' with varied assist level.  At times he only requires min a x 2 but he has episodes of L knee bucking or crossing his feet which require mod a x 2 to prevent falls.  During gait it is noted that he will also hyperextend on L knee and is unable to control with verbal cues.  Pt may benefit from AFO or alternate orthotics to provide support and protect L knee with mobility skills.  IT is also of note LUE is weak and requires support with standing and gait to prevent L shoulder injury. Arm was supported by Clinical research associatewriter and may need sling or shoulder support brace as therapy progresses.  Remained in recliner after session with arm supported on pillow.  Pt continues to work hard and show excellent potential for CIR.     Follow Up Recommendations  CIR     Equipment Recommendations  Other (comment)    Recommendations for Other Services Rehab consult     Precautions / Restrictions Precautions Precautions: Fall Restrictions Weight Bearing Restrictions: No    Mobility  Bed Mobility Overal bed mobility: Needs Assistance Bed Mobility:  Supine to Sit     Supine to sit: Min assist;HOB elevated     General bed mobility comments: Assist to bring LLE to EOB and to elevate trunk  Transfers Overall transfer level: Needs assistance Equipment used: Hemi-walker Transfers: Sit to/from Stand Sit to Stand: Min assist;+2 physical assistance         General transfer comment: generally decreased safety and somewhat quick movement.  Education to slow down and for hand placements.  Ambulation/Gait Ambulation/Gait assistance: Min assist;Mod assist;+2 physical assistance Gait Distance (Feet): 30 Feet Assistive device: Hemi-walker Gait Pattern/deviations: Step-to pattern;Decreased step length - left;Decreased stance time - left;Antalgic Gait velocity: decreased   General Gait Details: irreglar step pattern with verbal cues for sequencing.  Overall decreased assist and increased distance but he does have several instances where L knee will buckle or hyperextend and at times crosses LE's and needs phsyical assist to uncross them.  Continues to requrire +2 assist for safety.   Stairs             Wheelchair Mobility    Modified Rankin (Stroke Patients Only)       Balance Overall balance assessment: Needs assistance Sitting-balance support: Feet supported;Single extremity supported Sitting balance-Leahy Scale: Poor     Standing balance support: Single extremity supported Standing balance-Leahy Scale: Poor Standing balance comment: Relies on BUE support for static and dynamic activities  Cognition Arousal/Alertness: Awake/alert Behavior During Therapy: WFL for tasks assessed/performed;Flat affect Overall Cognitive Status: Impaired/Different from baseline                                        Exercises Other Exercises Other Exercises: supine exercises ankle pumps, SLR, heel slides, ab/add, SAQ and bridges 2 x 10 with assist as needed for LLE control.     General Comments        Pertinent Vitals/Pain Pain Assessment: No/denies pain    Home Living                      Prior Function            PT Goals (current goals can now be found in the care plan section) Progress towards PT goals: Progressing toward goals    Frequency    7X/week      PT Plan Current plan remains appropriate    Co-evaluation              AM-PAC PT "6 Clicks" Mobility   Outcome Measure  Help needed turning from your back to your side while in a flat bed without using bedrails?: A Little Help needed moving from lying on your back to sitting on the side of a flat bed without using bedrails?: A Little Help needed moving to and from a bed to a chair (including a wheelchair)?: A Lot Help needed standing up from a chair using your arms (e.g., wheelchair or bedside chair)?: A Lot Help needed to walk in hospital room?: A Lot Help needed climbing 3-5 steps with a railing? : A Lot 6 Click Score: 14    End of Session Equipment Utilized During Treatment: Gait belt Activity Tolerance: Patient tolerated treatment well Patient left: in chair;with chair alarm set;with call bell/phone within reach;with nursing/sitter in room Nurse Communication: Mobility status       Time: [id number] PT Time Calculation (min) (ACUTE ONLY): 24 min  Charges:  $Gait Training: 8-22 mins $Therapeutic Exercise: 8-22 mins                     Danielle DessSarah Roshan Townsend, PTA 09/03/18, 11:49 AM

## 2018-09-03 NOTE — Plan of Care (Signed)

## 2018-09-03 NOTE — Progress Notes (Signed)
Inpatient Rehabilitation-Admissions Coordinator   Spoke with pt via phone regarding CIR program, expectations, expected length of stay, insurance benefits, and anticipated support recommended at DC. Pt wants to pursue CIR at this time. AC is awaiting call back from family to confirm support at DC. Will follow up with pt Monday for possible admit.   Please call if questions.   Nanine Means, OTR/L  Rehab Admissions Coordinator  [phone] 09/03/2018 3:12 PM

## 2018-09-03 NOTE — Progress Notes (Signed)
[facility] - [place] [facility]   PATIENT NAME: Evan Townsend    MR#:  [id number]  DATE OF BIRTH:  [birth year]  SUBJECTIVE:  CHIEF COMPLAINT:   Chief Complaint  Patient presents with  . left sided weakness   Continues to have left-sided weakness.  Dysarthria.  Worked with physical therapy.  REVIEW OF SYSTEMS:    Review of Systems  Constitutional: Positive for malaise/fatigue. Negative for chills and fever.  HENT: Negative for sore throat.   Eyes: Negative for blurred vision, double vision and pain.  Respiratory: Negative for cough, hemoptysis, shortness of breath and wheezing.   Cardiovascular: Negative for chest pain, palpitations, orthopnea and leg swelling.  Gastrointestinal: Negative for abdominal pain, constipation, diarrhea, heartburn, nausea and vomiting.  Genitourinary: Negative for dysuria and hematuria.  Musculoskeletal: Negative for back pain and joint pain.  Skin: Negative for rash.  Neurological: Positive for speech change and focal weakness. Negative for sensory change and headaches.  Endo/Heme/Allergies: Does not bruise/bleed easily.  Psychiatric/Behavioral: Negative for depression. The patient is not nervous/anxious.     DRUG ALLERGIES:  No Known Allergies  VITALS:  Blood pressure (!) 145/76, pulse (!) 47, temperature 97.8 F (36.6 C), temperature source Oral, resp. rate 17, height 5\' 6"  (1.676 m), weight 68 kg, SpO2 100 %.  PHYSICAL EXAMINATION:   Physical Exam  GENERAL:  63 y.o.-year-old patient lying in the bed with no acute distress.  EYES: Pupils equal, round, reactive to light and accommodation. No scleral icterus. Extraocular muscles intact.  HEENT: Head atraumatic, normocephalic. Oropharynx and nasopharynx clear.  NECK:  Supple, no jugular venous distention. No thyroid enlargement, no tenderness.  LUNGS: Normal breath sounds bilaterally, no wheezing, rales, rhonchi. No use of accessory muscles of respiration.  CARDIOVASCULAR:  S1, S2 normal. No murmurs, rubs, or gallops.  ABDOMEN: Soft, nontender, nondistended. Bowel sounds present. No organomegaly or mass.  EXTREMITIES: No cyanosis, clubbing or edema b/l.    NEUROLOGIC: Right facial droop.  Left upper extremity 1/5 motor strength.  Left lower extremity 4/5.  Right sided motor strength normal.  Sensations intact PSYCHIATRIC: The patient is alert and oriented x 3.  SKIN: No obvious rash, lesion, or ulcer.   LABORATORY PANEL:   CBC Recent Labs  Lab 09/01/18 0913  WBC 5.9  HGB 11.8*  HCT 35.3*  PLT 241   ------------------------------------------------------------------------------------------------------------------ Chemistries  Recent Labs  Lab 09/01/18 0913  NA 138  K 3.7  CL 107  CO2 24  GLUCOSE 343*  BUN 18  CREATININE 1.08  CALCIUM 8.5*  AST 24  ALT 64*  ALKPHOS 145*  BILITOT 0.6   ------------------------------------------------------------------------------------------------------------------  Cardiac Enzymes Recent Labs  Lab 09/01/18 0913  TROPONINI <0.03   ------------------------------------------------------------------------------------------------------------------  RADIOLOGY:  Ct Head Wo Contrast  Result Date: 09/01/2018 CLINICAL DATA:  Left facial droop and left-sided weakness. Suspect stroke. EXAM: CT HEAD WITHOUT CONTRAST TECHNIQUE: Contiguous axial images were obtained from the base of the skull through the vertex without intravenous contrast. COMPARISON:  MRI head 09/10/2017, CT head 09/10/2017 FINDINGS: Brain: Ventricle size normal. Patchy hypodensity in the cerebral white matter bilaterally appears unchanged. Small chronic infarct in the left thalamus unchanged. Negative for acute infarct, hemorrhage, or mass. Vascular: Negative for hyperdense vessel Skull: Negative Sinuses/Orbits: Mucosal edema right maxillary sinus.  Normal orbit Other: None IMPRESSION: No acute intracranial abnormality. Chronic microvascular ischemic  changes similar to the prior study. Electronically Signed   By: Marlan Palau M.D.   On: 09/01/2018 09:23   Mr Brain  Wo Contrast  Result Date: 09/01/2018 CLINICAL DATA:  63 year old male presents with new left side weakness facial droop and slurred speech. EXAM: MRI HEAD WITHOUT CONTRAST MRA HEAD WITHOUT CONTRAST TECHNIQUE: Multiplanar, multiecho pulse sequences of the brain and surrounding structures were obtained without intravenous contrast. Angiographic images of the head were obtained using MRA technique without contrast. COMPARISON:  Brain MRI and intracranial MRA 09/10/2017 and earlier. FINDINGS: MRI HEAD FINDINGS Brain: Linear 12 millimeter acute lacunar infarct of the posterior limb right internal capsule (series 5, image 27). Mild T2 and FLAIR hyperintensity without hemorrhage or mass effect. No other restricted diffusion. Expected evolution of the January 2019 left thalamic lacune, and multiple chronic lacunar infarcts in the bilateral deep gray matter and corona radiata. Occasional chronic micro hemorrhages. Hemosiderin also in the right periatrial white matter. Chronic lacune suspected also in the left midbrain. No cortical encephalomalacia. No midline shift, mass effect, evidence of mass lesion, ventriculomegaly, extra-axial collection or acute intracranial hemorrhage. Cervicomedullary junction and pituitary are within normal limits. Vascular: Major intracranial vascular flow voids are stable. Skull and upper cervical spine: Negative visible cervical spine. Visualized bone marrow signal is within normal limits. Sinuses/Orbits: Bilateral maxillary sinus mucosal thickening. Negative orbits. Other: Mastoids are clear. Visible internal auditory structures appear normal. Scalp and face soft tissues appear negative. MRA HEAD FINDINGS Antegrade flow in the posterior circulation with stable, negative distal vertebral arteries and basilar. The distal left vertebral is mildly dominant. Patent bilateral PICA  origins. Patent SCA and PCA origins. Fetal type right PCA with left posterior communicating artery also present. Mild bilateral PCA branch irregularity has not significantly changed. Antegrade flow in both ICA siphons appears stable. There is widespread siphon irregularity in keeping with atherosclerosis. Mild bilateral supraclinoid segment stenosis. Normal ophthalmic artery origins. Patent carotid termini. Normal MCA and ACA origins. Stable bilateral MCA and ACA branches with mild irregularity throughout. Negative anterior communicating artery. IMPRESSION: 1. Positive for acute lacunar infarct of the posterior limb right internal capsule. No associated hemorrhage or mass effect. 2. Underlying advanced chronic small vessel disease otherwise stable since January 2019. Occasional associated chronic micro-hemorrhages. 3. Stable intracranial MRA since 2019 with widespread intracranial atherosclerosis but no hemodynamically significant stenosis. Electronically Signed   By: Odessa Fleming M.D.   On: 09/01/2018 15:41   US Carotid Bilateral (at [facility] And [facility] Only)  Result Date: 09/01/2018 CLINICAL DATA:  Stroke/TIA. History of hypertension, hyperlipidemia, diabetes and smoking EXAM: BILATERAL CAROTID DUPLEX ULTRASOUND TECHNIQUE: Wallace Cullens scale imaging, color Doppler and duplex ultrasound were performed of bilateral carotid and vertebral arteries in the neck. COMPARISON:  None. FINDINGS: Criteria: Quantification of carotid stenosis is based on velocity parameters that correlate the residual internal carotid diameter with NASCET-based stenosis levels, using the diameter of the distal internal carotid lumen as the denominator for stenosis measurement. The following velocity measurements were obtained: RIGHT ICA: 94/29 cm/sec CCA: 85/18 cm/sec SYSTOLIC ICA/CCA RATIO:  1.1 ECA: 97 cm/sec LEFT ICA: 85/31 cm/sec CCA: 87/20 cm/sec SYSTOLIC ICA/CCA RATIO:  1.2 ECA: 65 cm/sec RIGHT CAROTID ARTERY: There is a minimal to moderate amount of  intimal thickening/hypoechoic plaque within the right carotid bulb (images 15 and 18), extending to involve the origin and proximal aspects of the right internal carotid artery (image 22), not resulting in elevated peak systolic velocities within the interrogated course the right internal carotid artery to suggest a hemodynamically significant stenosis. RIGHT VERTEBRAL ARTERY:  Antegrade flow LEFT CAROTID ARTERY: There is no grayscale evidence of significant intimal thickening or atherosclerotic  plaque affecting the interrogated portions of the left carotid system. There are no elevated peak systolic velocities within the interrogated course of the left internal carotid artery to suggest a hemodynamically significant stenosis. LEFT VERTEBRAL ARTERY:  Antegrade Flow IMPRESSION: 1. Minimal to moderate amount of right-sided atherosclerotic plaque, not resulting in a hemodynamically significant stenosis. 2. Normal sonographic evaluation of the left carotid system. Electronically Signed   By: Simonne Come M.D.   On: 09/01/2018 12:12   Mr Maxine Glenn Head/brain ZO Cm  Result Date: 09/01/2018 CLINICAL DATA:  63 year old male presents with new left side weakness facial droop and slurred speech. EXAM: MRI HEAD WITHOUT CONTRAST MRA HEAD WITHOUT CONTRAST TECHNIQUE: Multiplanar, multiecho pulse sequences of the brain and surrounding structures were obtained without intravenous contrast. Angiographic images of the head were obtained using MRA technique without contrast. COMPARISON:  Brain MRI and intracranial MRA 09/10/2017 and earlier. FINDINGS: MRI HEAD FINDINGS Brain: Linear 12 millimeter acute lacunar infarct of the posterior limb right internal capsule (series 5, image 27). Mild T2 and FLAIR hyperintensity without hemorrhage or mass effect. No other restricted diffusion. Expected evolution of the January 2019 left thalamic lacune, and multiple chronic lacunar infarcts in the bilateral deep gray matter and corona radiata.  Occasional chronic micro hemorrhages. Hemosiderin also in the right periatrial white matter. Chronic lacune suspected also in the left midbrain. No cortical encephalomalacia. No midline shift, mass effect, evidence of mass lesion, ventriculomegaly, extra-axial collection or acute intracranial hemorrhage. Cervicomedullary junction and pituitary are within normal limits. Vascular: Major intracranial vascular flow voids are stable. Skull and upper cervical spine: Negative visible cervical spine. Visualized bone marrow signal is within normal limits. Sinuses/Orbits: Bilateral maxillary sinus mucosal thickening. Negative orbits. Other: Mastoids are clear. Visible internal auditory structures appear normal. Scalp and face soft tissues appear negative. MRA HEAD FINDINGS Antegrade flow in the posterior circulation with stable, negative distal vertebral arteries and basilar. The distal left vertebral is mildly dominant. Patent bilateral PICA origins. Patent SCA and PCA origins. Fetal type right PCA with left posterior communicating artery also present. Mild bilateral PCA branch irregularity has not significantly changed. Antegrade flow in both ICA siphons appears stable. There is widespread siphon irregularity in keeping with atherosclerosis. Mild bilateral supraclinoid segment stenosis. Normal ophthalmic artery origins. Patent carotid termini. Normal MCA and ACA origins. Stable bilateral MCA and ACA branches with mild irregularity throughout. Negative anterior communicating artery. IMPRESSION: 1. Positive for acute lacunar infarct of the posterior limb right internal capsule. No associated hemorrhage or mass effect. 2. Underlying advanced chronic small vessel disease otherwise stable since January 2019. Occasional associated chronic micro-hemorrhages. 3. Stable intracranial MRA since 2019 with widespread intracranial atherosclerosis but no hemodynamically significant stenosis. Electronically Signed   By: Odessa Fleming M.D.   On:  09/01/2018 15:41     ASSESSMENT AND PLAN:   #Acute right basal ganglia CVA On aspirin and Plavix. Appreciate neurology input.  Carotid Dopplers with nothing significant.  Echo showed EF 65% Continue neurochecks.  Telemetry monitoring. Worked with physical therapy and Occupational Therapy.  Inpatient rehab recommended.  Waiting for screening.  # Insulin requiring diabetes mellitus Levemir 10 units subcu every 24 hours and moderate sliding scale insulin  # Hypertension-blood pressure elevated Will hold amlodipine and continue lisinopril as blood pressure is really high Will allow permissive hypertension Bradycardia;watch closely  # Hyperlipidemia ;continue statin check fasting lipid panel  # Tobacco abuse disorder  Counseled to quit smoking on admission.  All the records are reviewed and case discussed  with Care Management/Social Worker Management plans discussed with the patient, family and they are in agreement.  CODE STATUS: DO NOT RESUSCITATE  DVT Prophylaxis: SCDs  TOTAL TIME TAKING CARE OF THIS PATIENT: 35 minutes.   POSSIBLE D/C IN 1-2 DAYS, DEPENDING ON CLINICAL CONDITION.  Katha HammingSnehalatha Kerstie Agent M.D on 09/03/2018 at 8:14 AM  Between 7am to 6pm - Pager - [phone]  After 6pm go to [email or web address] - password [email or web address]  [facility]  Office  [phone]  CC: Primary care physician; Patient, No Pcp Per  Note: This dictation was prepared with Dragon dictation along with smaller phrase technology. Any transcriptional errors that result from this process are unintentional.

## 2018-09-03 NOTE — Progress Notes (Signed)
Occupational Therapy Treatment Patient Details Name: Evan Townsend E Ewton Sr. MRN: [id number] DOB: 28-Jun-1956 Today's Date: 09/03/2018    History of present illness Pt is a 63 y/o M who presented with L facial droop, L sided weakness, numbness, and dysarthria.  Head CT negative.  MRI revealed R BG lacunar infarct.  Pt's PMH includes stroke.    OT comments  Pt seen for OT treatment on this date. Upon arrival to room, Pt seated in recliner and agreeable to tx. Pt instructed on compensatory strategies for self-care and UB dressing. Pt engaged in self-care task of applying deodorant and UB dressing task of putting on a T-shirt (see ADL section below for detail). Pt able to complete both activities on this date. Continues to be agreeable to tx, and expresses desire to go to CIR. Will continue to follow POC. DC recommendation remains appropriate.    Follow Up Recommendations  CIR    Equipment Recommendations  (TBD at next venue of care. )    Recommendations for Other Services      Precautions / Restrictions Precautions Precautions: Fall Restrictions Weight Bearing Restrictions: No       Mobility Bed Mobility Overal bed mobility: Needs Assistance Bed Mobility: Supine to Sit     Supine to sit: Min assist;HOB elevated     General bed mobility comments: Assist to bring LLE to EOB and to elevate trunk  Transfers         General transfer comment: Pt seated in recliner at beginning/end of session. Able to use LE to reposition as needed.     Balance Overall balance assessment: Needs assistance Sitting-balance support: Feet supported;Single extremity supported Sitting balance-Leahy Scale: Poor Sitting balance - Comments: Pt relies on UE support sitting in recliner. Would lean back agains chair when unable to support at least 1 UE during UB dressing.                              ADL either performed or assessed with clinical judgement   ADL Overall ADL's : Needs  assistance/impaired     Grooming: Set up;Minimal assistance;Bed level;Wash/dry face(Applied Deodorant) Grooming Details (indicate cue type and reason): Full set-up required. Pt independent with application of deodorant to L side, however required Max a for application to R side. Discussed adaptive strategies and substitutions such as spray-on deodorant to improve independence with self-care. Upper Body Bathing: Set up;Minimal assistance;Cueing for compensatory techniques;Sitting Upper Body Bathing Details (indicate cue type and reason): Pt able to don T-shirt on this date using hemi-dressing strategies. Provided Min A for straightening of shirt after pt pulled it over his head and non-affected side. Pt with increased time and effort for UB dressing task on this date. Required VC for use of compensatory strategies.                                  Vision Baseline Vision/History: Wears glasses(Reports needing a new perscription.) Patient Visual Report: No change from baseline     Perception     Praxis      Cognition Arousal/Alertness: Awake/alert Behavior During Therapy: WFL for tasks assessed/performed;Flat affect Overall Cognitive Status: Within Functional Limits for tasks assessed  Exercises Other Exercises Other Exercises: Instructed pt on use of compensatory strategies for hemiplegia when completing self-care and UB dressing tasks.    Shoulder Instructions       General Comments      Pertinent Vitals/ Pain       Pain Assessment: No/denies pain  Home Living                                          Prior Functioning/Environment              Frequency  Min 3X/week        Progress Toward Goals  OT Goals(current goals can now be found in the care plan section)  Progress towards OT goals: Progressing toward goals  Acute Rehab OT Goals Patient Stated Goal: to become as  independent as possible, to go to CIR OT Goal Formulation: With patient Potential to Achieve Goals: Good  Plan Discharge plan remains appropriate    Co-evaluation                 AM-PAC OT "6 Clicks" Daily Activity     Outcome Measure   Help from another person eating meals?: A Little Help from another person taking care of personal grooming?: A Little Help from another person toileting, which includes using toliet, bedpan, or urinal?: A Lot Help from another person bathing (including washing, rinsing, drying)?: A Lot Help from another person to put on and taking off regular upper body clothing?: A Lot Help from another person to put on and taking off regular lower body clothing?: A Lot 6 Click Score: 14    End of Session    OT Visit Diagnosis: Muscle weakness (generalized) (M62.81);Hemiplegia and hemiparesis;Unsteadiness on feet (R26.81) Hemiplegia - Right/Left: Left Hemiplegia - dominant/non-dominant: Non-Dominant Hemiplegia - caused by: Cerebral infarction   Activity Tolerance Patient tolerated treatment well   Patient Left in chair;with call bell/phone within reach;with chair alarm set   Nurse Communication          Time: 1610-9604: 1328-1349 OT Time Calculation (min): 21 min  Charges: OT General Charges $OT Visit: 1 Visit OT Treatments $Self Care/Home Management : 8-22 mins    Rockney GheeSerenity Juron Vorhees, M.S., OTR/L Ascom: [phone] 09/03/18, 2:20 PM

## 2018-09-03 NOTE — Plan of Care (Signed)
Monitor heart rate, rate has been in the 40's. Encourage patient to engage activity.

## 2018-09-03 NOTE — PMR Pre-admission (Signed)
[facility] PMR Admission Coordinator Pre-Admission Assessment  Patient: Evan Townsend. is an 63 y.o., male MRN: [id number] DOB: Jul 14, 1956 Height: _0  (167.6 cm) Weight: 68 kg  Insurance Information HMO:    PPO:     PCP:      IPA:      80/20:      OTHER:  PRIMARY: Medicaid [place]      Policy#: [id number]      Subscriber: Patient COVERAGE CODE: [place] Name:       Phone#:      Fax#:  Pre-Cert#:       Employer:  Benefits:  Phone #: [phone]     Name: via Automated system, verified eligibility on 09/02/18 Eff. Date: verified on 09/02/18     Deduct: NA       Out of Pocket Max: NA     Life Max: NA CIR: Covered per Medicaid guidelines      SNF:  Outpatient:      Co-Pay:  Home Health:       Co-Pay:  DME:      Co-Pay:  Providers:  SECONDARY:       Policy#:       Subscriber:  CM Name:       Phone#:      Fax#:  Pre-Cert#:       Employer:  Benefits:  Phone #:      Name:  Eff. Date:      Deduct:       Out of Pocket Max:       Life Max:  CIR:       SNF: Outpatient:      Co-Pay:  Home Health:       Co-Pay:  DME:      Co-Pay:   Medicaid Application Date:       Case Manager:  Disability Application Date:       Case Worker:   Emergency Contact Information Contact Information    Name Relation Home Work Mobile   Anzaldo,Beverly D Spouse [phone]        Current Medical History  Patient Admitting Diagnosis: Acute right basal ganglia CVA  History of Present Illness: Pt is a 63 y.o. male with history of DM, hyperlipidemia, HTN, prior stroke, and tobacco use. Pt presented to ED with left facial droop, left sided weakness and numbness, and dysarthia. Pt was noted to have difficulty walking without assistance. Pt had elevated BP upon arrival to ED at 192/93. Initial CT was negative for acute intracranial abnormality. Pt was admitted for further work up. 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  Pt was seen by OT, PT,  and SLP with recommendations for CIR. On 1/19, PT noted increased weakness on affected side with increased assist required for transfers. Stat CT ordered with no new focal infarct, hemorrhage, or space-occupying mass lesion seen. Another repeat head CT was performed on 1/20 due to worsening weakness on the left, again with no acute findings. Pt is to be admitted to CIR on 09/06/2018.   Patient's medical record from [facility] has been reviewed by the rehabilitation admission coordinator and physician.     Past Medical History  Past Medical History:  Diagnosis Date  . Diabetes 1.5, managed as type 2 ([place])   . Diabetes mellitus without complication ([place])   . Hypercholesterolemia   . Hypertension   . Pain in both feet   . Stroke [facility])  Family History   family history includes Diabetes in his father; Hypertension in his father and mother.  Prior Rehab/Hospitalizations Has the patient had major surgery during 100 days prior to admission? No    Current Medications  Current Facility-Administered Medications:  .  acetaminophen (TYLENOL) tablet 650 mg, 650 mg, Oral, Q4H PRN **OR** acetaminophen (TYLENOL) solution 650 mg, 650 mg, Per Tube, Q4H PRN **OR** acetaminophen (TYLENOL) suppository 650 mg, 650 mg, Rectal, Q4H PRN, Gouru, Aruna, MD .  aspirin EC tablet 81 mg, 81 mg, Oral, Daily, Gouru, Aruna, MD, 81 mg at 09/05/18 0953 .  atorvastatin (LIPITOR) tablet 80 mg, 80 mg, Oral, QPM, Arnaldo Natal, MD, 80 mg at 09/05/18 1757 .  clopidogrel (PLAVIX) tablet 75 mg, 75 mg, Oral, Daily, Gouru, Aruna, MD, 75 mg at 09/05/18 0953 .  enoxaparin (LOVENOX) injection 40 mg, 40 mg, Subcutaneous, Q24H, Gouru, Aruna, MD, 40 mg at 09/05/18 1251 .  gabapentin (NEURONTIN) capsule 600 mg, 600 mg, Oral, TID, Gouru, Aruna, MD, 600 mg at 09/05/18 2038 .  glipiZIDE (GLUCOTROL) tablet 10 mg, 10 mg, Oral, QAC breakfast, Gouru, Aruna, MD, 10 mg at 09/05/18 0829 .  hydrALAZINE (APRESOLINE)  injection 10 mg, 10 mg, Intravenous, Q6H PRN, Vaughan Basta, MD, 10 mg at 09/02/18 1836 .  insulin aspart (novoLOG) injection 0-15 Units, 0-15 Units, Subcutaneous, TID WC, Gouru, Aruna, MD, 3 Units at 09/04/18 1734 .  insulin aspart (novoLOG) injection 0-5 Units, 0-5 Units, Subcutaneous, QHS, Gouru, Aruna, MD, 2 Units at 09/03/18 2143 .  insulin detemir (LEVEMIR) injection 12 Units, 12 Units, Subcutaneous, Daily, Epifanio Lesches, MD, 12 Units at 09/05/18 2124 .  lisinopril (PRINIVIL,ZESTRIL) tablet 40 mg, 40 mg, Oral, Daily, Gouru, Aruna, MD, 40 mg at 09/05/18 0952 .  nicotine (NICODERM CQ - dosed in mg/24 hours) patch 14 mg, 14 mg, Transdermal, Daily, Gouru, Aruna, MD, 14 mg at 09/05/18 0953 .  polyvinyl alcohol (LIQUIFILM TEARS) 1.4 % ophthalmic solution 1 drop, 1 drop, Right Eye, PRN, Epifanio Lesches, MD, 1 drop at 09/03/18 1133 .  sodium chloride flush (NS) 0.9 % injection 10 mL, 10 mL, Intravenous, Q12H, Epifanio Lesches, MD, 10 mL at 09/05/18 2041 .  traMADol (ULTRAM) tablet 50 mg, 50 mg, Oral, Q6H PRN, Gouru, Aruna, MD  Patients Current Diet:   Diet Order            DIET DYS 3 Room service appropriate? Yes with Assist; Fluid consistency: Nectar Thick  Diet effective now              Precautions / Restrictions Precautions Precautions: Fall Restrictions Weight Bearing Restrictions: No   Has the patient had 2 or more falls or a fall with injury in the past year?No  Prior Activity Level Community (5-7x/wk): Per pt, he was very active PTA, working full time, driving and getting out of the house daily  Prior Functional Level Do you want Prior Function Level of Independence: Independent Comments: Pt reports he is on diability for previous stroke but reports not residual deficits.  Pt ind wth ADLs and ambualtes without AD with no falls in the past 6 months.  Does not drive.   from other? Self Care: Did the patient need help bathing, dressing, using the toilet  or eating?  Independent  Indoor Mobility: Did the patient need assistance with walking from room to room (with or without device)? Independent  Stairs: Did the patient need assistance with internal or external stairs (with or without device)? Independent  Functional Cognition: Did the patient  need help planning regular tasks such as shopping or remembering to take medications? Independent  Home Assistive Devices / Equipment Home Assistive Devices/Equipment: None Home Equipment: None  Prior Device Use: Indicate devices/aids used by the patient prior to current illness, exacerbation or injury? None of the above  Prior Functional Level Comments: Pt reports he is on diability for previous stroke but reports not residual deficits.  Pt ind wth ADLs and ambualtes without AD with no falls in the past 6 months.  Does not drive.     Prior Functional Level Current Functional Level  Bed Mobility Independent  Mod A  Transfers Independent  Mod A  Mobility - Walk/Wheelchair Independent On 1/18: Min/Mod A x 2 for 30 feet; on 1/19: unable/deferred; on 1/20: Mod A for 4 feet  Mobility - Ambulation/Gait Independent On 1/18: Min/Mod A x 2 for 30 feet; on 1/19: unable/deferred; on 1/20: Mod A for 4 feet  Upper Body Dressing Independent Min A on 1/18; NT on 1/20  Lower Body Dressing Independent Max A on 1/18; NT on 1/20  Grooming Independent Min A on 1/18; NT on 1/20  Eating/Drinking Independent Min A on 1/18; NT on 1/20  Toilet Transfer Independent  NT on 1/18; Max A x2 on 1/20  Bladder Continence Continent, Independent Continent, toileting functional level: Max A x2  Bowel Management  Continent, Independent Continent, last BM 09/04/18  Stair Climbing Independent Not tested  Communication WNL Moderate dysarthria with imprecise articulation  Memory WNL Deficits in memory according to breakdown of MOCA score of 14/30.   Cooking/Meal Prep  Independent      Housework  Independent   Money Management   Independent   Driving  Independent (inconsistency from PT evaluation stating pt does not drive).      Special needs/care consideration BiPAP/CPAP: no CPM: no Continuous Drip IV: no Dialysis: No        Days: NA Life Vest: No Oxygen: no, RA Special Bed: no Trach Size: no Wound Vac (area): no    Location: no Skin: no areas of concern           Bowel mgmt:continenet, last BM 09/04/18 Bladder mgmt:continent Diabetic mgmt: yes  Previous Home Environment Living Arrangements: Spouse/significant other Available Help at Discharge: Family, Available PRN/intermittently(wife works during the day) Type of Home: Mobile home Home Layout: One level Home Access: Stairs to enter Entrance Stairs-Rails: Left Entrance Stairs-Number of Steps: 5 Bathroom Shower/Tub: Optometrist: Yes Home Care Services: No  Discharge Living Setting Plans for Discharge Living Setting: Patient's home, House, Lives with (comment)(Wife) Type of Home at Discharge: House Discharge Home Layout: One level Discharge Home Access: Stairs to enter Entrance Stairs-Rails: Left Entrance Stairs-Number of Steps: 4 steps Discharge Bathroom Shower/Tub: Tub/shower unit Discharge Bathroom Toilet: Standard Discharge Bathroom Accessibility: Yes How Accessible: Accessible via walker Does the patient have any problems obtaining your medications?: Yes (Describe)  Social/Family/Support Systems Patient Roles: Spouse Contact Information: wife Rise Paganini); [phone] Anticipated Caregiver: wife and extended family (grown children and grandchildren all live nearby)  Anticipated Caregiver's Contact Information: see above Ability/Limitations of Caregiver: Min A Caregiver Availability: 24/7, wife works approx 2-3hrs/day with plan for family to stay with him during that time.  Discharge Plan Discussed with Primary Caregiver: Yes Is Caregiver In Agreement with Plan?: Yes Does  Caregiver/Family have Issues with Lodging/Transportation while Pt is in Rehab?: No  Goals/Additional Needs Patient/Family Goal for Rehab: PT/OT/SLP: Supervision  Expected length of stay: 7-10 days Cultural Considerations: NA  Dietary Needs: DYS 3, nectar thick Equipment Needs: TBD Pt/Family Agrees to Admission and willing to participate: Yes Program Orientation Provided & Reviewed with Pt/Caregiver Including Roles  & Responsibilities: Yes(pt and wife)  Barriers to Discharge: Home environment access/layout, Lack of/limited family support  Barriers to Discharge Comments: steps to enter home; tub shower; wife works  Patient Condition: I have reviewed medical records from [facility], spoken with CM, RN, and the patient and his spouse. I met with patient at the bedside and discussed via phone details regarding CIR program and expectations and gathered all pertinent information needed for inpatient rehabilitation assessment.  Patient will benefit from ongoing PT, OT, and SLP, can actively participate in 3 hours of therapy a day 5 days of the week, and can make measurable gains during the admission.  Patient will also benefit from the coordinated team approach during an Inpatient Acute Rehabilitation admission.  The patient will receive intensive therapy as well as Rehabilitation physician, nursing, social worker, and care management interventions.  Due to bowel management, bladder management, safety, skin/wound care, disease management, medical administration, pain management, patient education the patient requires 24 hour a day rehabilitation nursing.  The patient is currently fluctuating between Mod A and Mod A x2 for transfers and Min A for basic ADLs (Max A for LB ADLs).  Discharge setting and therapy post discharge at home with home health is anticipated.  Patient has agreed to participate in the Acute Inpatient Rehabilitation Program and will admit today, 09/06/18.  Preadmission  Screen Completed By:  Jhonnie Garner, 09/06/2018 9:05 AM ______________________________________________________________________   Discussed status with Dr. Naaman Plummer on 09/06/18 at 8:42AM and received telephone approval for admission today.  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 Does the need for close, 24 hr/day  Medical supervision in concert with the patient's rehab needs make it unreasonable for this patient to be served in a less intensive setting? Yes 2. Co-Morbidities requiring supervision/potential complications: DM, HTN, previous stroke 3. Due to bladder management, bowel management, safety, skin/wound care, disease management, medication administration, pain management and patient education, does the patient require 24 hr/day rehab nursing? Yes 4. Does the patient require coordinated care of a physician, rehab nurse, PT (1-2 hrs/day, 5 days/week), OT (1-2 hrs/day, 5 days/week) and SLP (1-2 hrs/day, 5 days/week) to address physical and functional deficits in the context of the above medical diagnosis(es)? Yes Addressing deficits in the following areas: balance, endurance, locomotion, strength, transferring, bowel/bladder control, bathing, dressing, feeding, grooming, toileting, cognition, speech, swallowing and psychosocial support 5. Can the patient actively participate in an intensive therapy program of at least 3 hrs of therapy 5 days a week? Yes 6. The potential for patient to make measurable gains while on inpatient rehab is excellent 7. Anticipated functional outcomes upon discharge from inpatients are: supervision PT, supervision OT, supervision SLP 8. Estimated rehab length of stay to reach the above functional goals is: 7-11 days 9. Anticipated D/C setting: Home 10. Anticipated post D/C treatments: [place] therapy 11. Overall Rehab/Functional Prognosis:  excellent    RECOMMENDATIONS: This patient's condition is appropriate for continued rehabilitative care in the following setting: CIR Patient has agreed to participate in recommended program. Yes Note that insurance prior authorization may be required for reimbursement for recommended care.  Comment: Admit to inpatient rehab today  Meredith Staggers, MD, [facility] 09/06/2018   Jhonnie Garner 09/06/2018

## 2018-09-04 ENCOUNTER — Inpatient Hospital Stay: Payer: Medicaid Other

## 2018-09-04 LAB — GLUCOSE, CAPILLARY
Glucose-Capillary: 195 mg/dL — ABNORMAL HIGH (ref 70–99)
Glucose-Capillary: 103 mg/dL — ABNORMAL HIGH (ref 70–99)
Glucose-Capillary: 183 mg/dL — ABNORMAL HIGH (ref 70–99)
Glucose-Capillary: 168 mg/dL — ABNORMAL HIGH (ref 70–99)

## 2018-09-04 MED ORDER — INSULIN DETEMIR 100 UNIT/ML ~~LOC~~ SOLN
12.0000 [IU] | Freq: Every day | SUBCUTANEOUS | Status: DC
  Administered 2018-09-04 – 2018-09-05 (×2): 12 [IU] via SUBCUTANEOUS
  Filled 2018-09-04 (×3): qty 0.12

## 2018-09-04 NOTE — Plan of Care (Signed)
Na

## 2018-09-04 NOTE — Progress Notes (Signed)
Dr. Luberta Mutter notified of head CT results.

## 2018-09-04 NOTE — Plan of Care (Signed)
  Problem: Education: Goal: Knowledge of General Education information will improve Description Including pain rating scale, medication(s)/side effects and non-pharmacologic comfort measures Outcome: Progressing   Problem: Health Behavior/Discharge Planning: Goal: Ability to manage health-related needs will improve Outcome: Progressing   Problem: Activity: Goal: Risk for activity intolerance will decrease Outcome: Progressing   Problem: Education: Goal: Knowledge of secondary prevention will improve Outcome: Progressing Goal: Knowledge of patient specific risk factors addressed and post discharge goals established will improve Outcome: Progressing

## 2018-09-04 NOTE — Progress Notes (Signed)
[employer] - [place] at [facility]   PATIENT NAME: Evan Townsend    MR#:  [id number]  DATE OF BIRTH:  1955-12-20  SUBJECTIVE:  CHIEF COMPLAINT:   Chief Complaint  Patient presents with  . left sided weakness   Patient continues to have left-sided weakness, dysarthria, waiting for inpatient rehab bed at [facility]. REVIEW OF SYSTEMS:    Review of Systems  Constitutional: Positive for malaise/fatigue. Negative for chills and fever.  HENT: Negative for sore throat.   Eyes: Negative for blurred vision, double vision and pain.  Respiratory: Negative for cough, hemoptysis, shortness of breath and wheezing.   Cardiovascular: Negative for chest pain, palpitations, orthopnea and leg swelling.  Gastrointestinal: Negative for abdominal pain, constipation, diarrhea, heartburn, nausea and vomiting.  Genitourinary: Negative for dysuria and hematuria.  Musculoskeletal: Negative for back pain and joint pain.  Skin: Negative for rash.  Neurological: Positive for speech change and focal weakness. Negative for sensory change and headaches.  Endo/Heme/Allergies: Does not bruise/bleed easily.  Psychiatric/Behavioral: Negative for depression. The patient is not nervous/anxious.     DRUG ALLERGIES:  No Known Allergies  VITALS:  Blood pressure 121/69, pulse (!) 58, temperature 98.3 F (36.8 C), temperature source Oral, resp. rate 19, height 5\' 6"  (1.676 m), weight 68 kg, SpO2 99 %.  PHYSICAL EXAMINATION:   Physical Exam  GENERAL:  63 y.o.-year-old patient lying in the bed with no acute distress.  EYES: Pupils equal, round, reactive to light. No scleral icterus. Extraocular muscles intact.  HEENT: Head atraumatic, normocephalic. Oropharynx and nasopharynx clear.  Patient has dysarthria NECK:  Supple, no jugular venous distention. No thyroid enlargement, no tenderness.  LUNGS: Normal breath sounds bilaterally, no wheezing, rales, rhonchi. No use of accessory muscles of  respiration.  CARDIOVASCULAR: S1, S2 normal. No murmurs, rubs, or gallops.  ABDOMEN: Soft, nontender, nondistended. Bowel sounds present. No organomegaly or mass.  EXTREMITIES: No cyanosis, clubbing or edema b/l.    NEUROLOGIC: Right facial droop.  Left upper extremity 1/5 motor strength.  Left lower extremity 4/5.  Right sided motor strength normal.  Sensations intact ,dysarthric PSYCHIATRIC: The patient is alert and oriented x 3.  SKIN: No obvious rash, lesion, or ulcer.   LABORATORY PANEL:   CBC Recent Labs  Lab 09/01/18 0913  WBC 5.9  HGB 11.8*  HCT 35.3*  PLT 241   ------------------------------------------------------------------------------------------------------------------ Chemistries  Recent Labs  Lab 09/01/18 0913  NA 138  K 3.7  CL 107  CO2 24  GLUCOSE 343*  BUN 18  CREATININE 1.08  CALCIUM 8.5*  AST 24  ALT 64*  ALKPHOS 145*  BILITOT 0.6   ------------------------------------------------------------------------------------------------------------------  Cardiac Enzymes Recent Labs  Lab 09/01/18 0913  TROPONINI <0.03   ------------------------------------------------------------------------------------------------------------------  RADIOLOGY:  No results found.   ASSESSMENT AND PLAN:   #Acute right basal ganglia CVA; due to risk factors of hypertension, diabetes mellitus On aspirin and Plavix.,  Statins appreciate neurology input.  Carotid Dopplers with nothing significant.  Echo showed EF 65% Continue neurochecks.  Telemetry monitoring. Worked with physical therapy and Occupational Therapy.  Inpatient rehab recommended.  Seen by inpatient rehab admission coordinator, possible discharge on Monday for admission to CIR.  # Insulin requiring diabetes mellitus; likely uncontrolled, adjust Levemir today    # Hypertension-controlled, started back on lisinopril.  and continue lisinopril as blood pressure is really high Will allow permissive  hypertension   Bradycardia;watch closely  # Hyperlipidemia ;continue statin  , patient LDL is 52 # Tobacco abuse disorder  Counseled to quit smoking on admission.  All the records are reviewed and case discussed with Care Management/Social Worker Management plans discussed with the patient, family and they are in agreement.  CODE STATUS: DO NOT RESUSCITATE  DVT Prophylaxis: SCDs  TOTAL TIME TAKING CARE OF THIS PATIENT: 35 minutes.   POSSIBLE D/C IN 1-2 DAYS, DEPENDING ON CLINICAL CONDITION.  Katha HammingSnehalatha Brewster Wolters M.D on 09/04/2018 at 10:54 AM  Between 7am to 6pm - Pager - [phone]  After 6pm go to [email or web address] - password [email or web address]  [facility] [place] [facility]  Office  [phone]  CC: Primary care physician; Patient, No Pcp Per  Note: This dictation was prepared with Dragon dictation along with smaller phrase technology. Any transcriptional errors that result from this process are unintentional.

## 2018-09-04 NOTE — Progress Notes (Addendum)
Got called by PT  into room, Per PT patient seems to have worsening weakness of left leg with only hip abduction and extension of the L LE. On my assessment noticed minimal changes from this morning's neuro assessment,  Dr. Luberta Mutter notified and received verbal order for STAT head CT, Will continue to monitor.

## 2018-09-04 NOTE — Progress Notes (Signed)
Patient attempted to roll over on his right side without any assistance. Patient has left side upper extremity increased weakness. Patient is unable to utilize left arm for any activity. Bed alarm was alarming, entered the room to find significant other holding patient in bed. Legs were on the floor. Patient limbs were unable to hold his weight. Visitor and staff was able to get patients legs back into the bed before he hit the floor.

## 2018-09-04 NOTE — Progress Notes (Addendum)
Physical Therapy Treatment Patient Details Name: Evan AnoDuncan E Lipsett Sr. MRN: [id number] DOB: November 03, 1955 Today's Date: 09/04/2018    History of Present Illness Pt is a 63 y/o M who presented with L facial droop, L sided weakness, numbness, and dysarthria.  Head CT negative.  MRI revealed R BG lacunar infarct.  Pt's PMH includes stroke.     PT Comments    Pt awake and eager to work with therapy today.  He appeared alert and oriented and with expressive aphasia which causes communication to be more difficult.  Pt presented today with very minimal functional use of L side, with only hip abduction and extension of the L LE.  All other L LE muscle function was 0/5 at the time of treatment.  Pt required mod A for bed mobility and presented with very poor sitting posture, requiring min A to recover from LOB's to L side.  Pt able to remain sitting independently for 5-10 seconds at a time.  Pt attempted STS and to initiate steps with L LE and use of hemi walker but was unsuccessful.  He was able to manage L LE hip abduction to move L LE toward chair but otherwise required a Max A standing pivot transfer.  PT assisted pt with positioning for comfort and prevention of pressure ulcers.  Pt demonstrated proficiency with HEP exercises using R LE and was able to perform hip extension strengthening with pillow placed beneath knee. Pt will continue to benefit from skilled PT with focus on strength, safe functional mobility and use of appropriate AD.  Follow Up Recommendations  CIR     Equipment Recommendations  Other (comment)(TBD at next venue of care)    Recommendations for Other Services Rehab consult     Precautions / Restrictions Precautions Precautions: Fall Restrictions Weight Bearing Restrictions: No    Mobility  Bed Mobility Overal bed mobility: Needs Assistance Bed Mobility: Supine to Sit     Supine to sit: HOB elevated;Mod assist     General bed mobility comments: Required hand held assist  to get to EOB; very poor sitting balance once there.  Transfers Overall transfer level: Needs assistance Equipment used: 1 person hand held assist Transfers: Sit to/from [employer]Stand;Stand Pivot Transfers Sit to Stand: +2 physical assistance;Mod assist Stand pivot transfers: Max assist       General transfer comment: Pt unable to initiate movement of L LE except hip abduction to move LE toward recliner.  Unable to initiate a step with L LE when attempting to walk so pivot transfer to chair was performed instead.  Ambulation/Gait Ambulation/Gait assistance: (Deferred/Unable)               Stairs             Wheelchair Mobility    Modified Rankin (Stroke Patients Only)       Balance Overall balance assessment: Needs assistance Sitting-balance support: Single extremity supported Sitting balance-Leahy Scale: Poor Sitting balance - Comments: Pt presented with very poor sitting balance, requiring R UE support and losing balance to L side several times, requiring PT to assist (Min A) in correcting. Postural control: Left lateral lean;Posterior lean   Standing balance-Leahy Scale: Poor Standing balance comment: PT support with gait belt.  PT able to balance very poorly on R LE with use of hemi walker.                            Cognition Arousal/Alertness: Awake/alert Behavior During  Therapy: WFL for tasks assessed/performed;Flat affect Overall Cognitive Status: Within Functional Limits for tasks assessed                                        Exercises General Exercises - Lower Extremity Ankle Circles/Pumps: Strengthening;Right;10 reps;Supine Heel Slides: Strengthening;Right;10 reps;Supine Hip ABduction/ADduction: Strengthening;Right;10 reps;Supine Straight Leg Raises: Strengthening;Right;10 reps;Supine Other Exercises Other Exercises: Pt unable to initiate ther ex on L LE with exception of modified bridge with pillow placed beneath knee  x10.    General Comments        Pertinent Vitals/Pain      Home Living                      Prior Function            PT Goals (current goals can now be found in the care plan section) Acute Rehab PT Goals Patient Stated Goal: to become as independent as possible, to go to CIR PT Goal Formulation: With patient Time For Goal Achievement: 09/16/18 Potential to Achieve Goals: Good Progress towards PT goals: Not progressing toward goals - comment(L side function appears to have declined compared to previous treatments.)    Frequency    7X/week      PT Plan Current plan remains appropriate    Co-evaluation              AM-PAC PT "6 Clicks" Mobility   Outcome Measure  Help needed turning from your back to your side while in a flat bed without using bedrails?: A Lot Help needed moving from lying on your back to sitting on the side of a flat bed without using bedrails?: A Lot Help needed moving to and from a bed to a chair (including a wheelchair)?: A Lot Help needed standing up from a chair using your arms (e.g., wheelchair or bedside chair)?: A Lot Help needed to walk in hospital room?: A Lot Help needed climbing 3-5 steps with a railing? : A Lot 6 Click Score: 12    End of Session Equipment Utilized During Treatment: Gait belt Activity Tolerance: Patient tolerated treatment well Patient left: in chair;with chair alarm set;with call bell/phone within reach;with nursing/sitter in room Nurse Communication: Mobility status PT Visit Diagnosis: Unsteadiness on feet (R26.81);Hemiplegia and hemiparesis Hemiplegia - Right/Left: Left Hemiplegia - caused by: Cerebral infarction     Time: 1530-1602 PT Time Calculation (min) (ACUTE ONLY): 32 min  Charges:  $Therapeutic Exercise: 8-22 mins $Therapeutic Activity: 8-22 mins                     Glenetta Hew, PT, DPT    Glenetta Hew 09/04/2018, 4:48 PM

## 2018-09-05 ENCOUNTER — Inpatient Hospital Stay: Payer: Medicaid Other

## 2018-09-05 LAB — GLUCOSE, CAPILLARY
Glucose-Capillary: 121 mg/dL — ABNORMAL HIGH (ref 70–99)
Glucose-Capillary: 87 mg/dL (ref 70–99)
Glucose-Capillary: 139 mg/dL — ABNORMAL HIGH (ref 70–99)

## 2018-09-05 NOTE — Progress Notes (Signed)
Inpatient Rehabilitation-Admissions Coordinator   Noted and appreciated updated therapy treatment notes. AC will plan for pt to admit to CIR tomorrow. Will contact CM tomorrow morning for DC plans.   Please call if questions.   Nanine Means, OTR/L  Rehab Admissions Coordinator  [phone] 09/05/2018 4:06 PM

## 2018-09-05 NOTE — Progress Notes (Signed)
Occupational Therapy Treatment Patient Details Name: Evan LYSAGHT Sr. MRN: [id number] DOB: 1956-05-03 Today's Date: 09/05/2018    History of present illness Pt is a 63 y/o M who presented with L facial droop, L sided weakness, numbness, and dysarthria.  Head CT negative.  MRI revealed R BG lacunar infarct.  Pt's PMH includes stroke.    OT comments  Pt seen for OT treatment on this date. Upon arrival to room, Pt seated in recliner and agreeable to tx. Pt wife arrived to room briefly after beginning of session. Pt and wife provided with positioning after stroke handout. Pt engaged in teach-back of compensatory strategies for hemi-dressing as discussed during previous session. Pt with good recall of provided instruction on this date, only requiring prompting for technique to doff shirt using compensatory strategies. During session pt requested use of BSC for BM. Pt required mod/max assist for stand pivot transfer from recliner to [facility] on this date. +2 assist provided for safety and physical assist to maintain balance when completing toilet clothing manipulation. Pt eager to assist with this ADL task, however at times demonstrated poor safety judgement. On occasion attempting to stand without assistance or reach toilet paper outside of BOS briefly losing balance while seated on BSC. OT provided assist to maintain balance and VC throughout treatment session for safety and hand placement with transfers. Pt continues to be agreeable to tx, and expresses desire to go to CIR. Will continue to follow POC. DC recommendation remains appropriate.    Follow Up Recommendations  CIR    Equipment Recommendations  (TBD)    Recommendations for Other Services      Precautions / Restrictions Precautions Precautions: Fall Restrictions Weight Bearing Restrictions: No       Mobility Bed Mobility Overal bed mobility: Needs Assistance Bed Mobility: Sit to Supine     Supine to sit: Mod assist;Min assist;HOB  elevated     General bed mobility comments: Requires assist to mobilize LLE over edge of bed. Able to maintain sitting balance with RUE support with supervision.  Transfers Overall transfer level: Needs assistance Equipment used: Hemi-walker Transfers: Sit to/from Stand Sit to Stand: +2 safety/equipment;Mod assist Stand pivot transfers: Max assist;+2 safety/equipment;Mod assist       General transfer comment: Assist to brace LLE and steady with rise. Demonstrates good effort to stand.     Balance Overall balance assessment: Needs assistance Sitting-balance support: Single extremity supported Sitting balance-Leahy Scale: Fair Sitting balance - Comments: Pt presented with very poor sitting balance, requiring R UE support and losing balance to L side several times, requiring assist (Min A) in correcting. Postural control: Left lateral lean;Posterior lean Standing balance support: Single extremity supported Standing balance-Leahy Scale: Poor Standing balance comment: OT support with gait belt.  Wife present and providing +2 support for standing balance with functional ADL task (clothing manipulation).                           ADL either performed or assessed with clinical judgement   ADL Overall ADL's : Needs assistance/impaired                         Toilet Transfer: Maximal assistance;+2 for safety/equipment;+2 for physical assistance;BSC;Stand-pivot Toilet Transfer Details (indicate cue type and reason): Assisted pt with use of BSC on this date. Pt required max assist +2 for safety and phyical assistance 2/2 LLE weakness.  Toileting- Clothing Manipulation and Hygiene: Maximal  assistance;+2 for safety/equipment;+2 for physical assistance;Cueing for safety;Moderate assistance Toileting - Clothing Manipulation Details (indicate cue type and reason): Pt required mod/max assist for clothing manipulation on this date. Pt requiring assistance to pull pants/underwear  down and pull up. Pt attempts to assist, however 2/2 LLE weakness is unable to maintain balance while completing clothing manipulation. VC provided for safety.              Vision Baseline Vision/History: Wears glasses Patient Visual Report: No change from baseline     Perception     Praxis      Cognition Arousal/Alertness: Awake/alert Behavior During Therapy: WFL for tasks assessed/performed;Flat affect Overall Cognitive Status: Within Functional Limits for tasks assessed                                          Exercises  Other Exercises: Provided reinforcement of education on positioning for affected sideto promote safety and decrease risk of skin breakdown. Provided positioning after stroke hand-out. Other Exercises: Instructed pt and wife in strategies for completing PROM of LUE.    Shoulder Instructions       General Comments      Pertinent Vitals/ Pain       Pain Assessment: No/denies pain  Home Living                                          Prior Functioning/Environment              Frequency  Min 3X/week        Progress Toward Goals  OT Goals(current goals can now be found in the care plan section)  Progress towards OT goals: Progressing toward goals  Acute Rehab OT Goals Patient Stated Goal: to become as independent as possible, to go to CIR OT Goal Formulation: With patient Potential to Achieve Goals: Good  Plan Discharge plan remains appropriate    Co-evaluation                 AM-PAC OT "6 Clicks" Daily Activity     Outcome Measure   Help from another person eating meals?: A Little Help from another person taking care of personal grooming?: A Little Help from another person toileting, which includes using toliet, bedpan, or urinal?: A Lot Help from another person bathing (including washing, rinsing, drying)?: A Lot Help from another person to put on and taking off regular upper body  clothing?: A Lot Help from another person to put on and taking off regular lower body clothing?: A Lot 6 Click Score: 14    End of Session Equipment Utilized During Treatment: Gait belt  OT Visit Diagnosis: Muscle weakness (generalized) (M62.81);Hemiplegia and hemiparesis;Unsteadiness on feet (R26.81) Hemiplegia - Right/Left: Left Hemiplegia - dominant/non-dominant: Non-Dominant Hemiplegia - caused by: Cerebral infarction   Activity Tolerance Patient tolerated treatment well   Patient Left in bed;with call bell/phone within reach;with bed alarm set;with family/visitor present(wife present)   Nurse Communication Other (comment)(Pt use of BSC during session. )        Time: 8502-7741 OT Time Calculation (min): 48 min  Charges: OT General Charges $OT Visit: 1 Visit OT Treatments $Self Care/Home Management : 23-37 mins $Therapeutic Exercise: 8-22 mins  Rockney Ghee, M.S., OTR/L Ascom: [phone] 09/05/18, 3:28 PM

## 2018-09-05 NOTE — Care Management (Signed)
Spoke with Nanine Means from CIR.  Patient has not been cleared for acceptance to CIR today.  She did follow up with patient's wife and meets the criteria for in home support post acute inpatient rehab.  There are concerns about documentation of increased weakness over the weekend. head CT did not show any additional findings. Tresa Endo says her Wellsite geologist is asking questions about any additional treatment or testing for today.  Patient is for a swallowing study today.  [facility] is requesting updated physical therapy eval for today.Marland Kitchen  Reached out to therapist via secure chat to request.

## 2018-09-05 NOTE — Care Management (Signed)
Reached out to therapy in regards to today's treatment

## 2018-09-05 NOTE — Progress Notes (Signed)
Inpatient Rehabilitation-Admissions Coordinator   Met with pt and his wife at the bedside to review CIR program expectations and confirm interest. Both pt and his wife in agreement with CIR. AC noted PT note from yesterday, stating a decline in functional with increased LLE weakness. AC plans to follow up with medical team this morning to see if there is any additional work up warranted prior to CIR.   Please call if questions.   Jhonnie Garner, OTR/L  Rehab Admissions Coordinator  [phone] 09/05/2018 9:29 AM

## 2018-09-05 NOTE — Care Management (Signed)
CM was informed that patient for transfer to CIR today at noon.  CM has reached out to [place] with [facility] to discuss and confirm

## 2018-09-05 NOTE — Progress Notes (Signed)
Physical Therapy Treatment Patient Details Name: Evan KNICELY Sr. MRN: [id number] DOB: January 22, 1956 Today's Date: 09/05/2018    History of Present Illness Pt is a 63 y/o M who presented with L facial droop, L sided weakness, numbness, and dysarthria.  Head CT negative.  MRI revealed R BG lacunar infarct.  Pt's PMH includes stroke.     PT Comments    Pt agreeable to PT; denies pain. Continues with LUE flaccid and LLE very weak. Pt able to demonstrate good effort toward all functional movement, but ultimately requires Mod A throughout bed mobility, STS transfers and limited ambulation bed to chair. Pt demonstrates 2- strength in L hip flexion and knee extension and flexion and 0 DF this session. Pt educated on seated self assisted hip flexion and knee flexion. Encouraged knee extension in range strength allows at this time when pt does not have assist for full range. Pt demonstrates understanding. Continue PT to progress muscular strength and endurance as well as balance to improve all functional mobility.   Follow Up Recommendations  CIR     Equipment Recommendations  Other (comment)(TBD at next venue)    Recommendations for Other Services       Precautions / Restrictions Precautions Precautions: Fall Restrictions Weight Bearing Restrictions: No    Mobility  Bed Mobility Overal bed mobility: Needs Assistance Bed Mobility: Supine to Sit     Supine to sit: Mod assist;Min assist;HOB elevated     General bed mobility comments: Requires assist to mobilize LLE over edge of bed; poor eccentric control of LLE. Initial LOB posterior with attempted Mod I to sit requiring Mod A. Able to maintain sitting balance then with RUE support with supervision  Transfers Overall transfer level: Needs assistance Equipment used: Hemi-walker Transfers: Sit to/from Stand Sit to Stand: Mod assist         General transfer comment: Assist to brace LLE and steady with rise. Demonstrates good effort  to stand  Ambulation/Gait Ambulation/Gait assistance: Mod assist Gait Distance (Feet): 4 Feet Assistive device: Hemi-walker Gait Pattern/deviations: Step-to pattern;Decreased step length - right;Decreased step length - left;Decreased stance time - left;Decreased dorsiflexion - left;Decreased weight shift to left(trunk foward and to L; encourage R support on hemi walker) Gait velocity: slow   General Gait Details: Sequence cues for all steps for proper weight shift, lean, steps and posture. Requires assist to maintain LLE knee extension with L weight bearing.    Stairs             Wheelchair Mobility    Modified Rankin (Stroke Patients Only)       Balance Overall balance assessment: Needs assistance Sitting-balance support: Single extremity supported Sitting balance-Leahy Scale: Fair     Standing balance support: Single extremity supported Standing balance-Leahy Scale: Poor                              Cognition Arousal/Alertness: Awake/alert Behavior During Therapy: WFL for tasks assessed/performed;Flat affect Overall Cognitive Status: Within Functional Limits for tasks assessed                                        Exercises General Exercises - Lower Extremity Quad Sets: Strengthening;Left;10 reps;Standing(with weight shift to L, assisted) Long Arc Quad: AAROM;Strengthening;Left;10 reps;Seated;Other (comment)(encouraged eccentric hold/control once assisted to end range) Hip Flexion/Marching: AAROM;Strengthening;Left;15 reps;Seated(improved initiation with work) Scientist, clinical (histocompatibility and immunogenetics)  Other Exercises: seated static balance    General Comments        Pertinent Vitals/Pain Pain Assessment: No/denies pain    Home Living                      Prior Function            PT Goals (current goals can now be found in the care plan section) Progress towards PT goals: Progressing toward goals    Frequency    7X/week       PT Plan Current plan remains appropriate    Co-evaluation              AM-PAC PT "6 Clicks" Mobility   Outcome Measure  Help needed turning from your back to your side while in a flat bed without using bedrails?: A Lot Help needed moving from lying on your back to sitting on the side of a flat bed without using bedrails?: A Lot Help needed moving to and from a bed to a chair (including a wheelchair)?: A Lot Help needed standing up from a chair using your arms (e.g., wheelchair or bedside chair)?: A Lot Help needed to walk in hospital room?: A Lot Help needed climbing 3-5 steps with a railing? : A Lot 6 Click Score: 12    End of Session Equipment Utilized During Treatment: Gait belt Activity Tolerance: Patient tolerated treatment well Patient left: in chair;with call bell/phone within reach;with family/visitor present;Other (comment)(refuses alarm; will call to get up. Visitors confirm)   PT Visit Diagnosis: Unsteadiness on feet (R26.81);Hemiplegia and hemiparesis Hemiplegia - Right/Left: Left Hemiplegia - caused by: Cerebral infarction     Time: [id number] PT Time Calculation (min) (ACUTE ONLY): 25 min  Charges:  $Gait Training: 8-22 mins $Therapeutic Exercise: 8-22 mins                      Scot DockHeidi E , PTA 09/05/2018, 1:13 PM

## 2018-09-05 NOTE — Progress Notes (Signed)
[facility] [place] [facility]   PATIENT NAME: Evan Townsend    MR#:  [id number]  DATE OF BIRTH:  10/14/1955  SUBJECTIVE:  CHIEF COMPLAINT:   Chief Complaint  Patient presents with  . left sided weakness   Patient continues to have left-sided weakness but that is not worse from what he has been feeling.  Physical therapy yesterday felt he is more weak on left , patient saw a different physical therapist, he did not mention that he is more weaker on left side yesterday.  Talked to inpatient rehab coordinator Tresa Endo, recommends 1 more physical therapy evaluation and decide on CIR either today or tomorrow. REVIEW OF SYSTEMS:    Review of Systems  Constitutional: Positive for malaise/fatigue. Negative for chills and fever.  HENT: Negative for sore throat.   Eyes: Negative for blurred vision, double vision and pain.  Respiratory: Negative for cough, hemoptysis, shortness of breath and wheezing.   Cardiovascular: Negative for chest pain, palpitations, orthopnea and leg swelling.  Gastrointestinal: Negative for abdominal pain, constipation, diarrhea, heartburn, nausea and vomiting.  Genitourinary: Negative for dysuria and hematuria.  Musculoskeletal: Negative for back pain and joint pain.  Skin: Negative for rash.  Neurological: Positive for speech change and focal weakness. Negative for sensory change and headaches.  Endo/Heme/Allergies: Does not bruise/bleed easily.  Psychiatric/Behavioral: Negative for depression. The patient is not nervous/anxious.     DRUG ALLERGIES:  No Known Allergies  VITALS:  Blood pressure 110/84, pulse 69, temperature 98 F (36.7 C), temperature source Oral, resp. rate 20, height 5\' 6"  (1.676 m), weight 68 kg, SpO2 100 %.  PHYSICAL EXAMINATION:   Physical Exam  GENERAL:  63 y.o.-year-old patient lying in the bed with no acute distress.  EYES: Pupils equal, round, reactive to light. No scleral icterus. Extraocular muscles intact.   HEENT: Head atraumatic, normocephalic. Oropharynx and nasopharynx clear.  Patient has dysarthria NECK:  Supple, no jugular venous distention. No thyroid enlargement, no tenderness.  LUNGS: Normal breath sounds bilaterally, no wheezing, rales, rhonchi. No use of accessory muscles of respiration.  CARDIOVASCULAR: S1, S2 normal. No murmurs, rubs, or gallops.  ABDOMEN: Soft, nontender, nondistended. Bowel sounds present. No organomegaly or mass.  EXTREMITIES: No cyanosis, clubbing or edema b/l.    NEUROLOGIC: Right facial droop.  Left upper extremity 1/5 motor strength.  Left lower extremity 4/5.  Right sided motor strength normal.  Sensations intact ,dysarthric PSYCHIATRIC: The patient is alert and oriented x 3.  SKIN: No obvious rash, lesion, or ulcer.   LABORATORY PANEL:   CBC Recent Labs  Lab 09/01/18 0913  WBC 5.9  HGB 11.8*  HCT 35.3*  PLT 241   ------------------------------------------------------------------------------------------------------------------ Chemistries  Recent Labs  Lab 09/01/18 0913  NA 138  K 3.7  CL 107  CO2 24  GLUCOSE 343*  BUN 18  CREATININE 1.08  CALCIUM 8.5*  AST 24  ALT 64*  ALKPHOS 145*  BILITOT 0.6   ------------------------------------------------------------------------------------------------------------------  Cardiac Enzymes Recent Labs  Lab 09/01/18 0913  TROPONINI <0.03   ------------------------------------------------------------------------------------------------------------------  RADIOLOGY:  Ct Head Wo Contrast  Result Date: 09/04/2018 CLINICAL DATA:  Increasing left leg weakness EXAM: CT HEAD WITHOUT CONTRAST TECHNIQUE: Contiguous axial images were obtained from the base of the skull through the vertex without intravenous contrast. COMPARISON:  09/01/2018 MRI FINDINGS: Brain: Changes consistent with the known posterior limb internal capsule infarcts on the right are again seen and stable. No new focal infarct,  hemorrhage or space-occupying mass lesion are seen. Vascular:  No hyperdense vessel or unexpected calcification. Skull: Normal. Negative for fracture or focal lesion. Sinuses/Orbits: No acute finding. Other: None. IMPRESSION: Right posterior limb internal capsule infarct similar to that seen on prior MRI. Electronically Signed   By: Alcide CleverMark  Lukens M.D.   On: 09/04/2018 17:22     ASSESSMENT AND PLAN:   #Acute right basal ganglia CVA; due to risk factors of hypertension, diabetes mellitus On aspirin and Plavix.,  Statins appreciate neurology input.  Carotid Dopplers with nothing significant.  Echo showed EF 65%  Worked with physical therapy and Occupational Therapy.  Inpatient rehab recommended.  Seen by inpatient rehab admission coordinator, spoke to SheldonKelly, because of physical therapy note yesterday wondering if he had any new strokes but CT of the head did not show anything new, has left-sided weakness that is not worse.  She still wants other physical therapy evaluation before she considers him for CIR placement  # Insulin requiring diabetes mellitus; adjusted Levemir dose yesterday, continue glipizide.  # Hypertension-controlled, started back on lisinopril.  and continue lisinopril   Bradycardia;re solved  # Hyperlipidemia ;continue statin  , patient LDL is 52 # Tobacco abuse disorder  Counseled to quit smoking on admission.  All the records are reviewed and case discussed with Care Management/Social Worker Management plans discussed with the patient, family and they are in agreement.  CODE STATUS: DO NOT RESUSCITATE  DVT Prophylaxis: SCDs  TOTAL TIME TAKING CARE OF THIS PATIENT: 35 minutes.   POSSIBLE D/C IN 1-2 DAYS, DEPENDING ON CLINICAL CONDITION.  Katha HammingSnehalatha Melaine Mcphee M.D on 09/05/2018 at 11:00 AM  Between 7am to 6pm - Pager - [phone]  After 6pm go to [email or web address] - password [email or web address]  [facility]  Office  [phone]  CC: Primary care physician;  Patient, No Pcp Per  Note: This dictation was prepared with Dragon dictation along with smaller phrase technology. Any transcriptional errors that result from this process are unintentional.

## 2018-09-05 NOTE — Evaluation (Signed)
Objective Swallowing Evaluation: Type of Study: MBS-Modified Barium Swallow Study   Patient Details  Name: Evan Townsend E Moncrieffe Sr. MRN: [id number] Date of Birth: 1955-09-04  Today's Date: 09/05/2018 Time: SLP Start Time (ACUTE ONLY): 1000 -SLP Stop Time (ACUTE ONLY): 1100  SLP Time Calculation (min) (ACUTE ONLY): 60 min   Past Medical History:  Past Medical History:  Diagnosis Date  . Diabetes 1.5, managed as type 2 (HCC)   . Diabetes mellitus without complication (HCC)   . Hypercholesterolemia   . Hypertension   . Pain in both feet   . Stroke [facility](HCC)    Past Surgical History: History reviewed. No pertinent surgical history. HPI: Pt is a 63 y.o. male with a known history of CVA 09/11/2017 w/ "mild-moderate dysarthria and mild cognitive linguistic deficits. Pt demonstrated decreased intelligibility at word, phrase, sentence and conversational levels d/t imprecise articulation. Intelligibility decreases in unknown context. Pt's decreased intelligibility impacts his ability to functionally communicate with others." per Cognitive-Linguistic language evaluation by Speech Therapy services that admission. Other PMH includes insulin requiring diabetes mellitus, hypertension, hyperlipidemia, pain in feet bilateral. Pt presented to the ED with a chief complaint of left facial droop and left-sided weakness associated with slurry speech which was noted upon awakening that morning at around 7 AM.  Patient was in his usual state of health until last night at 11 PM when he felt asleep.  During examination in the ED, patient still had slurry speech and left extremity weakness.  No family members at bedside in ED.  He admits drinking ETOH and last drink was on January 1.  Continues to smoke.     Subjective: pt awake, sitting in MBSS chair. Noted Left oral weakness and Left UE weakness; decreased tone in Left corner of mouth. Pt was verbally responsive to general questions; Dysarthria noted.     Assessment / Plan  / Recommendation  CHL IP CLINICAL IMPRESSIONS 09/05/2018  Clinical Impression Pt appears to present w/ Moderate oropharyngeal phase dyspahgia w/ increased risk for Aspiration of liquids thus impact on the Pulmonary status. During the pharyngeal phase, pt exhibited delayed pharyngeal swallow initiation w/ liquid bolus trials; moreso w/ thin liquids. This resulted in laryngeal Penetration and Aspiration occurring w/ thin liquids. Pt demonstrated a delayed cough response to the Aspiration episodes; no cough response to the Penetration episodes. Nectar consistency liquids and puree/solid trials triggered a more timely pharyngeal swallow at the level of the Valleculae. Adequate pharyngeal clearing of bolus material in the pharynx was noted; no significant bolus residue remained indicating adequate laryngeal excursion and pharyngeal pressure during the swallow. During the oral phase, reduced bolus control noted w/ premature spillage of liquids into the pharynx. Decreased lingual and labial strength/ROM during the oral phase impacted labial closure, bolus cohesion and manipulation, oral clearing. Oral residue remained requieing pt to use lingual sweeping and pumping in order to gather bolus residue to transfer A-P for swallowing. Bolus spillage from Left corner of mouth occurred; insensate to to (Left)labial residue remaining. No cervical Esophageal deficits noted during brief screening. Recommend a modified diet of Dysphagia level 3 w/ well-cut/minced meats (if needed) and moistened foods; NECTAR consistency liquids; aspiration precautions; compensatory strategies including lingual sweeping and f/u, dry swallows each bolus; Pills in Puree/Nectar liquids - Whole as tolerates.  Recommend pt f/u w/ ST services for Dysphagia therapy at discharge.   SLP Visit Diagnosis Dysphagia, oropharyngeal phase (R13.12)  Attention and concentration deficit following --  Frontal lobe and executive function deficit following --  Impact on safety and function Moderate aspiration risk      CHL IP TREATMENT RECOMMENDATION 09/05/2018  Treatment Recommendations Therapy as outlined in treatment plan below     Prognosis 09/05/2018  Prognosis for Safe Diet Advancement Good  Barriers to Reach Goals (No Data)  Barriers/Prognosis Comment did have some Left oral weakness/Dysarthria w/ last CVA    CHL IP DIET RECOMMENDATION 09/05/2018  SLP Diet Recommendations Dysphagia 3 (Mech soft) solids;Nectar thick liquid  Liquid Administration via Cup;Straw  Medication Administration Whole meds with puree  Compensations Minimize environmental distractions;Slow rate;Small sips/bites;Lingual sweep for clearance of pocketing;Multiple dry swallows after each bite/sip;Follow solids with liquid  Postural Changes Remain semi-upright after after feeds/meals (Comment);Seated upright at 90 degrees      CHL IP OTHER RECOMMENDATIONS 09/05/2018  Recommended Consults (No Data)  Oral Care Recommendations Oral care BID;Patient independent with oral care  Other Recommendations Order thickener from pharmacy;Prohibited food (jello, ice cream, thin soups);Remove water pitcher;Have oral suction available      CHL IP FOLLOW UP RECOMMENDATIONS 09/05/2018  Follow up Recommendations Inpatient Rehab      CHL IP FREQUENCY AND DURATION 09/05/2018  Speech Therapy Frequency (ACUTE ONLY) min 3x week  Treatment Duration 2 weeks           CHL IP ORAL PHASE 09/05/2018  Oral Phase Impaired  Oral - Pudding Teaspoon NT  Oral - Pudding Cup --  Oral - Honey Teaspoon NT  Oral - Honey Cup --  Oral - Nectar Teaspoon --  Oral - Nectar Cup 3  Oral - Nectar Straw 2  Oral - Thin Teaspoon --  Oral - Thin Cup 3  Oral - Thin Straw --  Oral - Puree 2  Oral - Mech Soft 1  Oral - Regular --  Oral - Multi-Consistency --  Oral - Pill NT  Oral Phase - Comment During the oral phase, reduced bolus control noted w/ premature spillage of liquids into the pharynx. Decreased  lingual and labial strength/ROM during the oral phase impacted labial closure, bolus cohesion and manipulation, oral clearing. Oral residue remained requieing pt to use lingual sweeping and pumping in order to gather bolus residue to transfer A-P for swallowing. Bolus spillage from Left corner of mouth occurred; insensate to to (Left)labial residue remaining.     CHL IP PHARYNGEAL PHASE 09/05/2018  Pharyngeal Phase Impaired  Pharyngeal- Pudding Teaspoon NT  Pharyngeal --  Pharyngeal- Pudding Cup --  Pharyngeal --  Pharyngeal- Honey Teaspoon NT  Pharyngeal --  Pharyngeal- Honey Cup --  Pharyngeal --  Pharyngeal- Nectar Teaspoon --  Pharyngeal --  Pharyngeal- Nectar Cup 3  Pharyngeal --  Pharyngeal- Nectar Straw 2  Pharyngeal --  Pharyngeal- Thin Teaspoon --  Pharyngeal --  Pharyngeal- Thin Cup 3  Pharyngeal --  Pharyngeal- Thin Straw --  Pharyngeal --  Pharyngeal- Puree 2  Pharyngeal --  Pharyngeal- Mechanical Soft 1  Pharyngeal --  Pharyngeal- Regular --  Pharyngeal --  Pharyngeal- Multi-consistency --  Pharyngeal --  Pharyngeal- Pill NT  Pharyngeal --  Pharyngeal Comment During the pharyngeal phase, pt exhibited delayed pharyngeal swallow initiation w/ liquid bolus trials; moreso w/ thin liquids. This resulted in laryngeal Penetration(x3) and Aspiration(x2) occurring w/ thin liquids. Pt demonstrated a delayed cough response to the Aspiration episodes; no cough response to the Penetration episodes. Nectar consistency liquids and puree/solid trials triggered a more timely pharyngeal swallow at the level of the Valleculae; trace laryngeal Penetration(x1) - no Aspiration was noted. Adequate pharyngeal clearing of bolus  material in the pharynx was noted; no significant bolus residue remained indicating adequate laryngeal excursion and pharyngeal pressure during the swallow.      CHL IP CERVICAL ESOPHAGEAL PHASE 09/05/2018  Cervical Esophageal Phase WFL  Pudding Teaspoon --  Pudding  Cup --  Honey Teaspoon --  Honey Cup --  Nectar Teaspoon --  Nectar Cup --  Nectar Straw --  Thin Teaspoon --  Thin Cup --  Thin Straw --  Puree --  Mechanical Soft --  Regular --  Multi-consistency --  Pill --  Cervical Esophageal Comment --        Jerilynn Som, MS, CCC-SLP Watson,Katherine 09/05/2018, 12:50 PM

## 2018-09-05 NOTE — Progress Notes (Signed)
Subjective: Patient continues to have left-sided weakness, left facial droop and dysarthria.No new stroke or strokelike symptoms reported thus far.  Pending discharge to inpatient rehab [facility].  Patient's wife currently at the bedside.  Objective: Current vital signs: BP 110/84 (BP Location: Right Arm)   Pulse 69   Temp 98 F (36.7 C) (Oral)   Resp 20   Ht 5\' 6"  (1.676 m)   Wt 68 kg   SpO2 100%   BMI 24.21 kg/m  Vital signs in last 24 hours: Temp:  [97.5 F (36.4 C)-98.6 F (37 C)] 98 F (36.7 C) (01/20 0754) Pulse Rate:  [59-80] 69 (01/20 0754) Resp:  [19-20] 20 (01/20 0606) BP: (110-187)/(74-90) 110/84 (01/20 0754) SpO2:  [99 %-100 %] 100 % (01/20 0754)  Intake/Output from previous day: 01/19 0701 - 01/20 0700 In: 460 [P.O.:460] Out: 400 [Urine:400] Intake/Output this shift: No intake/output data recorded. Nutritional status:  Diet Order            DIET DYS 2 Room service appropriate? Yes; Fluid consistency: Nectar Thick  Diet effective now             Neurological Exam  Mental Status: Alert, oriented, thought content appropriate. Speech dysarthric without evidence of aphasia. Able to follow 3 step commands without difficulty. Attention span and concentration seemed appropriate  Cranial Nerves: II: Discs flat bilaterally; Visual fields grossly normal, pupils equal, round, reactive to light and accommodation III,IV, VI: ptosis not present, extra-ocular motions intact bilaterally V,VII: smile asymmetric, left facial droop, facial light touch sensation decreased on the left VIII: hearing normal bilaterally IX,X: gag reflex present XI: bilateral shoulder shrug XII: midline tongue extension Motor: Right :Upper extremity 5/5Without pronator driftLeft: Upper extremity 2/5  Right:Lower extremity 5/5Left: Lower extremity 2/5 able to break gravity Tone and bulk:normal tone throughout; no atrophy  noted Sensory: Pinprick and light touch decreased on the left Deep Tendon Reflexes: Absent throughout Plantars: Right:upgoingLeft: upgoing Cerebellar: Finger-to-nosetesting intact bilaterally.Heel to shin testing normal bilaterally Gait: not tested due to safety concerns  Lab Results: Basic Metabolic Panel: Recent Labs  Lab 09/01/18 0913  NA 138  K 3.7  CL 107  CO2 24  GLUCOSE 343*  BUN 18  CREATININE 1.08  CALCIUM 8.5*    Liver Function Tests: Recent Labs  Lab 09/01/18 0913  AST 24  ALT 64*  ALKPHOS 145*  BILITOT 0.6  PROT 6.6  ALBUMIN 3.7   No results for input(s): LIPASE, AMYLASE in the last 168 hours. No results for input(s): AMMONIA in the last 168 hours.  CBC: Recent Labs  Lab 09/01/18 0913  WBC 5.9  NEUTROABS 3.3  HGB 11.8*  HCT 35.3*  MCV 88.3  PLT 241    Cardiac Enzymes: Recent Labs  Lab 09/01/18 0913  TROPONINI <0.03    Lipid Panel: Recent Labs  Lab 09/02/18 0420  CHOL 115  TRIG 64  HDL 50  CHOLHDL 2.3  VLDL 13  LDLCALC 52    CBG: Recent Labs  Lab 09/04/18 0740 09/04/18 1139 09/04/18 1648 09/04/18 2121 09/05/18 0755  GLUCAP 195* 103* 183* 168* 121*    Microbiology: No results found for this or any previous visit.  Coagulation Studies: No results for input(s): LABPROT, INR in the last 72 hours.  Imaging: Ct Head Wo Contrast  Result Date: 09/04/2018 CLINICAL DATA:  Increasing left leg weakness EXAM: CT HEAD WITHOUT CONTRAST TECHNIQUE: Contiguous axial images were obtained from the base of the skull through the vertex without intravenous contrast.  COMPARISON:  09/01/2018 MRI FINDINGS: Brain: Changes consistent with the known posterior limb internal capsule infarcts on the right are again seen and stable. No new focal infarct, hemorrhage or space-occupying mass lesion are seen. Vascular: No hyperdense vessel or unexpected calcification. Skull: Normal. Negative for fracture or focal lesion.  Sinuses/Orbits: No acute finding. Other: None. IMPRESSION: Right posterior limb internal capsule infarct similar to that seen on prior MRI. Electronically Signed   By: Alcide Clever M.D.   On: 09/04/2018 17:22   Medications:  I have reviewed the patient's current medications. Prior to Admission:  Medications Prior to Admission  Medication Sig Dispense Refill Last Dose  . amLODipine (NORVASC) 10 MG tablet Take 10 mg by mouth daily.   Past Week at Unknown time  . aspirin 81 MG EC tablet Take 1 tablet (81 mg total) by mouth daily. 30 tablet  09/01/2018 at 0700  . atorvastatin (LIPITOR) 40 MG tablet Take 1 tablet (40 mg total) by mouth every evening. 30 tablet 0 08/31/2018 at 2100  . gabapentin (NEURONTIN) 300 MG capsule Take 2 capsules (600 mg total) by mouth 3 (three) times daily. 180 capsule 2 08/31/2018 at 2100  . glipiZIDE (GLUCOTROL) 10 MG tablet Take 1 tablet (10 mg total) by mouth daily before breakfast. 30 tablet 3 09/01/2018 at 0700  . insulin aspart protamine- aspart (NOVOLOG MIX 70/30) (70-30) 100 UNIT/ML injection Inject 0.2 mLs (20 Units total) into the skin 2 (two) times daily with a meal. (Patient taking differently: Inject 15 Units into the skin daily with supper. ) 10 mL 11 08/31/2018 at Unknown time  . lisinopril (PRINIVIL,ZESTRIL) 40 MG tablet Take 1 tablet (40 mg total) by mouth daily. 30 tablet 3 08/31/2018 at 0700  . metFORMIN (GLUCOPHAGE-XR) 500 MG 24 hr tablet Take 1,000 mg by mouth 2 (two) times daily with a meal.   Past Week at Unknown time  . traMADol (ULTRAM) 50 MG tablet Take 1 tablet (50 mg total) by mouth every 6 (six) hours as needed. 20 tablet 0 Past Month at Unknown time  . clopidogrel (PLAVIX) 75 MG tablet Take 1 tablet (75 mg total) by mouth daily. 30 tablet 0 Taking  . HYDROcodone-acetaminophen (NORCO/VICODIN) 5-325 MG tablet Take 1 tablet by mouth every 6 (six) hours as needed for moderate pain. (Patient not taking: Reported on 09/01/2018) 12 tablet 0 Completed Course at  Unknown time  . meloxicam (MOBIC) 15 MG tablet Take 1 tablet (15 mg total) by mouth daily. (Patient not taking: Reported on 09/01/2018) 30 tablet 0 Not Taking at Unknown time  . predniSONE (DELTASONE) 10 MG tablet Take 1 tablet (10 mg total) by mouth daily. 6,5,4,3,2,1 six day taper (Patient not taking: Reported on 09/01/2018) 21 tablet 0 Completed Course at Unknown time   Scheduled: . aspirin EC  81 mg Oral Daily  . atorvastatin  80 mg Oral QPM  . clopidogrel  75 mg Oral Daily  . enoxaparin (LOVENOX) injection  40 mg Subcutaneous Q24H  . gabapentin  600 mg Oral TID  . glipiZIDE  10 mg Oral QAC breakfast  . insulin aspart  0-15 Units Subcutaneous TID WC  . insulin aspart  0-5 Units Subcutaneous QHS  . insulin detemir  12 Units Subcutaneous Daily  . lisinopril  40 mg Oral Daily  . nicotine  14 mg Transdermal Daily  . sodium chloride flush  10 mL Intravenous Q12H   Assessment: 63 y.o. male with past medical history of diabetes mellitus, hyperlipidemia, hypertension, prior stroke, and tobacco  use presenting to the ED with chief complaints of left facial droop, left sided weakness and numbness and dysarthria since 0730 am.MRI showed acute right basal ganglia lacunar stroke, MRA showed stable diffuse intracranial Atherosclerosis disease with no large vessel occlusion. 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  Echocardiogram did not show cardiac source of emboli, EF 65%.  LDL 52, previous hemoglobin A1c 10.3.  Patient was taking aspirin 81 mg prior to this event and was started on DUAP.  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   Recommendations"  1. Repeat CT head without contrast due to worsening weakness on the left concerning for progression of initial infarct. 2. Continue medical management with dual therapy Aspirin 81 mg/day and Plavix 75 mg /day with intensive management of  vascular risk factor to keep systolic BP (SBP) <140 mm Hg (382 mm Hg if diabetic) 3. Statin with goal low density lipoprotein (LDL) <70 mg/dl 4. Glycemic control with goal Hgb A1c<7.0  5. Smoking cessation counseling 6. Continued evaluation with PT/OT/speech therapy  This patient was staffed with Dr. Corliss Blacker, [facility] who personally evaluated patient, reviewed documentation and agreed with assessment and plan of care as above.  Webb Silversmith, DNP, FNP-BC Board certified Nurse Practitioner Neurology Department   LOS: 3 days   09/05/2018  9:58 AM   I have seen the patient and reviewed the above note. He reports worsening about 3 days ago, after initial admission. Most recent exam from Dr. Lavonna Monarch with 4/5 arm strenght, now 1/5 on my exam.   I suspect mild extension, but given need to exclude hemorrhagic extension obtained CT head which is negative.   At this point, secondary prevention consists of dual antiplatelets x 90 days, DM control, and smoking cessation. Prolonged rehab will be needed.   Please call if neurology can be of any further assistance.   Ritta Slot, MD [facility] [phone]  If 7pm- 7am, please page neurology on call as listed in [facility].

## 2018-09-06 ENCOUNTER — Other Ambulatory Visit: Payer: Self-pay

## 2018-09-06 ENCOUNTER — Encounter (HOSPITAL_COMMUNITY): Payer: Self-pay | Admitting: *Deleted

## 2018-09-06 ENCOUNTER — Inpatient Hospital Stay (HOSPITAL_COMMUNITY)
Admission: RE | Admit: 2018-09-06 | Discharge: 2018-09-23 | DRG: 057 | Disposition: A | Discharge status: Home / Self Care | Payer: Medicaid Other | Source: Other Acute Inpatient Hospital | Attending: Physical Medicine & Rehabilitation | Admitting: Physical Medicine & Rehabilitation

## 2018-09-06 DIAGNOSIS — Z79899 Other long term (current) drug therapy: Secondary | ICD-10-CM | POA: Diagnosis not present

## 2018-09-06 DIAGNOSIS — E11649 Type 2 diabetes mellitus with hypoglycemia without coma: Secondary | ICD-10-CM | POA: Diagnosis not present

## 2018-09-06 DIAGNOSIS — Z7982 Long term (current) use of aspirin: Secondary | ICD-10-CM

## 2018-09-06 DIAGNOSIS — I6381 Other cerebral infarction due to occlusion or stenosis of small artery: Secondary | ICD-10-CM | POA: Diagnosis present

## 2018-09-06 DIAGNOSIS — E785 Hyperlipidemia, unspecified: Secondary | ICD-10-CM | POA: Diagnosis not present

## 2018-09-06 DIAGNOSIS — Z8249 Family history of ischemic heart disease and other diseases of the circulatory system: Secondary | ICD-10-CM

## 2018-09-06 DIAGNOSIS — Z716 Tobacco abuse counseling: Secondary | ICD-10-CM | POA: Diagnosis not present

## 2018-09-06 DIAGNOSIS — G8194 Hemiplegia, unspecified affecting left nondominant side: Secondary | ICD-10-CM

## 2018-09-06 DIAGNOSIS — E119 Type 2 diabetes mellitus without complications: Secondary | ICD-10-CM

## 2018-09-06 DIAGNOSIS — E1142 Type 2 diabetes mellitus with diabetic polyneuropathy: Secondary | ICD-10-CM | POA: Diagnosis not present

## 2018-09-06 DIAGNOSIS — Z833 Family history of diabetes mellitus: Secondary | ICD-10-CM | POA: Diagnosis not present

## 2018-09-06 DIAGNOSIS — I69392 Facial weakness following cerebral infarction: Secondary | ICD-10-CM

## 2018-09-06 DIAGNOSIS — I69354 Hemiplegia and hemiparesis following cerebral infarction affecting left non-dominant side: Principal | ICD-10-CM

## 2018-09-06 DIAGNOSIS — Z7902 Long term (current) use of antithrombotics/antiplatelets: Secondary | ICD-10-CM | POA: Diagnosis not present

## 2018-09-06 DIAGNOSIS — R1310 Dysphagia, unspecified: Secondary | ICD-10-CM | POA: Diagnosis not present

## 2018-09-06 DIAGNOSIS — Z794 Long term (current) use of insulin: Secondary | ICD-10-CM

## 2018-09-06 DIAGNOSIS — E7800 Pure hypercholesterolemia, unspecified: Secondary | ICD-10-CM | POA: Diagnosis present

## 2018-09-06 DIAGNOSIS — E1169 Type 2 diabetes mellitus with other specified complication: Secondary | ICD-10-CM

## 2018-09-06 DIAGNOSIS — I69328 Other speech and language deficits following cerebral infarction: Secondary | ICD-10-CM | POA: Diagnosis not present

## 2018-09-06 DIAGNOSIS — F17210 Nicotine dependence, cigarettes, uncomplicated: Secondary | ICD-10-CM | POA: Diagnosis present

## 2018-09-06 DIAGNOSIS — E1165 Type 2 diabetes mellitus with hyperglycemia: Secondary | ICD-10-CM | POA: Diagnosis not present

## 2018-09-06 DIAGNOSIS — I69322 Dysarthria following cerebral infarction: Secondary | ICD-10-CM | POA: Diagnosis not present

## 2018-09-06 DIAGNOSIS — G8114 Spastic hemiplegia affecting left nondominant side: Secondary | ICD-10-CM | POA: Diagnosis not present

## 2018-09-06 DIAGNOSIS — K5900 Constipation, unspecified: Secondary | ICD-10-CM | POA: Diagnosis not present

## 2018-09-06 DIAGNOSIS — I69391 Dysphagia following cerebral infarction: Secondary | ICD-10-CM

## 2018-09-06 DIAGNOSIS — I10 Essential (primary) hypertension: Secondary | ICD-10-CM | POA: Diagnosis not present

## 2018-09-06 DIAGNOSIS — I639 Cerebral infarction, unspecified: Secondary | ICD-10-CM | POA: Diagnosis not present

## 2018-09-06 DIAGNOSIS — E1159 Type 2 diabetes mellitus with other circulatory complications: Secondary | ICD-10-CM | POA: Diagnosis not present

## 2018-09-06 DIAGNOSIS — M792 Neuralgia and neuritis, unspecified: Secondary | ICD-10-CM

## 2018-09-06 LAB — CBC
HCT: 40.4 % (ref 39.0–52.0)
HCT: 40 % (ref 39.0–52.0)
Hemoglobin: 12.9 g/dL — ABNORMAL LOW (ref 13.0–17.0)
Hemoglobin: 12.8 g/dL — ABNORMAL LOW (ref 13.0–17.0)
MCH: 29.1 pg (ref 26.0–34.0)
MCH: 28.8 pg (ref 26.0–34.0)
MCHC: 31.9 g/dL (ref 30.0–36.0)
MCHC: 32 g/dL (ref 30.0–36.0)
MCV: 91.2 fL (ref 80.0–100.0)
MCV: 89.9 fL (ref 80.0–100.0)
Platelets: 276 10*3/uL (ref 150–400)
Platelets: 254 10*3/uL (ref 150–400)
RBC: 4.43 MIL/uL (ref 4.22–5.81)
RBC: 4.45 MIL/uL (ref 4.22–5.81)
RDW: 14.2 % (ref 11.5–15.5)
RDW: 14.2 % (ref 11.5–15.5)
WBC: 5.3 10*3/uL (ref 4.0–10.5)
WBC: 5.7 10*3/uL (ref 4.0–10.5)
nRBC: 0 % (ref 0.0–0.2)
nRBC: 0 % (ref 0.0–0.2)

## 2018-09-06 LAB — CREATININE, SERUM
Creatinine, Ser: 1.53 mg/dL — ABNORMAL HIGH (ref 0.61–1.24)
Creatinine, Ser: 1.68 mg/dL — ABNORMAL HIGH (ref 0.61–1.24)
GFR calc Af Amer: 56 mL/min — ABNORMAL LOW (ref >60)
GFR calc Af Amer: 50 mL/min — ABNORMAL LOW (ref >60)
GFR calc non Af Amer: 48 mL/min — ABNORMAL LOW (ref >60)
GFR calc non Af Amer: 43 mL/min — ABNORMAL LOW (ref >60)

## 2018-09-06 LAB — GLUCOSE, CAPILLARY
Glucose-Capillary: 113 mg/dL — ABNORMAL HIGH (ref 70–99)
Glucose-Capillary: 202 mg/dL — ABNORMAL HIGH (ref 70–99)
Glucose-Capillary: 156 mg/dL — ABNORMAL HIGH (ref 70–99)

## 2018-09-06 MED ORDER — CLOPIDOGREL BISULFATE 75 MG PO TABS
75.0000 mg | ORAL_TABLET | Freq: Every day | ORAL | Status: DC
  Administered 2018-09-07 – 2018-09-23 (×17): 75 mg via ORAL
  Filled 2018-09-06 (×17): qty 1

## 2018-09-06 MED ORDER — GABAPENTIN 300 MG PO CAPS
600.0000 mg | ORAL_CAPSULE | Freq: Three times a day (TID) | ORAL | Status: DC
  Administered 2018-09-06 – 2018-09-15 (×27): 600 mg via ORAL
  Filled 2018-09-06 (×27): qty 2

## 2018-09-06 MED ORDER — ATORVASTATIN CALCIUM 80 MG PO TABS: 80.0000 mg | ORAL_TABLET | Freq: Every evening | ORAL | 0 refills | Status: DC

## 2018-09-06 MED ORDER — POLYVINYL ALCOHOL 1.4 % OP SOLN
1.0000 [drp] | OPHTHALMIC | Status: DC | PRN
  Filled 2018-09-06: qty 15

## 2018-09-06 MED ORDER — INSULIN ASPART 100 UNIT/ML ~~LOC~~ SOLN: 0.0000 [IU] | Freq: Every day | SUBCUTANEOUS | 11 refills | Status: DC

## 2018-09-06 MED ORDER — ATORVASTATIN CALCIUM 80 MG PO TABS
80.0000 mg | ORAL_TABLET | Freq: Every evening | ORAL | Status: DC
  Administered 2018-09-06 – 2018-09-22 (×17): 80 mg via ORAL
  Filled 2018-09-06 (×17): qty 1

## 2018-09-06 MED ORDER — INSULIN ASPART 100 UNIT/ML ~~LOC~~ SOLN: 0.0000 [IU] | Freq: Three times a day (TID) | SUBCUTANEOUS | 11 refills | Status: DC

## 2018-09-06 MED ORDER — INSULIN DETEMIR 100 UNIT/ML ~~LOC~~ SOLN: 12.0000 [IU] | Freq: Every day | SUBCUTANEOUS | 11 refills | Status: DC

## 2018-09-06 MED ORDER — LISINOPRIL 40 MG PO TABS
40.0000 mg | ORAL_TABLET | Freq: Every day | ORAL | Status: DC
  Administered 2018-09-07 – 2018-09-23 (×17): 40 mg via ORAL
  Filled 2018-09-06 (×17): qty 1

## 2018-09-06 MED ORDER — INSULIN ASPART 100 UNIT/ML ~~LOC~~ SOLN
0.0000 [IU] | Freq: Three times a day (TID) | SUBCUTANEOUS | Status: DC
  Administered 2018-09-06: 5 [IU] via SUBCUTANEOUS
  Administered 2018-09-07: 2 [IU] via SUBCUTANEOUS
  Administered 2018-09-07: 3 [IU] via SUBCUTANEOUS
  Administered 2018-09-08 – 2018-09-09 (×2): 2 [IU] via SUBCUTANEOUS
  Administered 2018-09-10 – 2018-09-11 (×2): 3 [IU] via SUBCUTANEOUS
  Administered 2018-09-12: 2 [IU] via SUBCUTANEOUS
  Administered 2018-09-12 – 2018-09-13 (×2): 3 [IU] via SUBCUTANEOUS
  Administered 2018-09-14 – 2018-09-22 (×9): 2 [IU] via SUBCUTANEOUS

## 2018-09-06 MED ORDER — ORAL CARE MOUTH RINSE
15.0000 mL | Freq: Two times a day (BID) | OROMUCOSAL | Status: DC
  Administered 2018-09-06 – 2018-09-22 (×30): 15 mL via OROMUCOSAL

## 2018-09-06 MED ORDER — SORBITOL 70 % SOLN
30.0000 mL | Freq: Every day | Status: DC | PRN
  Administered 2018-09-09 – 2018-09-16 (×2): 30 mL via ORAL
  Filled 2018-09-06 (×3): qty 30

## 2018-09-06 MED ORDER — ENOXAPARIN SODIUM 40 MG/0.4ML ~~LOC~~ SOLN: 40.0000 mg | SUBCUTANEOUS | Status: DC

## 2018-09-06 MED ORDER — NICOTINE 14 MG/24HR TD PT24
14.0000 mg | MEDICATED_PATCH | Freq: Every day | TRANSDERMAL | Status: DC
  Administered 2018-09-07 – 2018-09-23 (×17): 14 mg via TRANSDERMAL
  Filled 2018-09-06 (×17): qty 1

## 2018-09-06 MED ORDER — GLIPIZIDE 5 MG PO TABS
10.0000 mg | ORAL_TABLET | Freq: Every day | ORAL | Status: DC
  Administered 2018-09-07 – 2018-09-16 (×10): 10 mg via ORAL
  Filled 2018-09-06 (×11): qty 2

## 2018-09-06 MED ORDER — ACETAMINOPHEN 160 MG/5ML PO SOLN: 650.0000 mg | ORAL | Status: DC | PRN

## 2018-09-06 MED ORDER — ACETAMINOPHEN 325 MG PO TABS: 650.0000 mg | ORAL_TABLET | ORAL | Status: DC | PRN

## 2018-09-06 MED ORDER — TRAMADOL HCL 50 MG PO TABS
50.0000 mg | ORAL_TABLET | Freq: Four times a day (QID) | ORAL | Status: DC | PRN
  Filled 2018-09-06: qty 1

## 2018-09-06 MED ORDER — ENOXAPARIN SODIUM 40 MG/0.4ML ~~LOC~~ SOLN
40.0000 mg | SUBCUTANEOUS | Status: DC
  Administered 2018-09-06 – 2018-09-22 (×17): 40 mg via SUBCUTANEOUS
  Filled 2018-09-06 (×17): qty 0.4

## 2018-09-06 MED ORDER — ACETAMINOPHEN 650 MG RE SUPP: 650.0000 mg | RECTAL | Status: DC | PRN

## 2018-09-06 MED ORDER — INSULIN DETEMIR 100 UNIT/ML ~~LOC~~ SOLN
12.0000 [IU] | Freq: Every day | SUBCUTANEOUS | Status: DC
  Administered 2018-09-06 – 2018-09-15 (×10): 12 [IU] via SUBCUTANEOUS
  Filled 2018-09-06 (×11): qty 0.12

## 2018-09-06 MED ORDER — ASPIRIN EC 81 MG PO TBEC
81.0000 mg | DELAYED_RELEASE_TABLET | Freq: Every day | ORAL | Status: DC
  Administered 2018-09-07 – 2018-09-23 (×17): 81 mg via ORAL
  Filled 2018-09-06 (×17): qty 1

## 2018-09-06 NOTE — Progress Notes (Signed)
Patient ID: Evan EBERL Sr., male   DOB: 1955-10-30, 63 y.o.   MRN: [id number] Patient arrived via [facility] from [facility]. Patient and family oriented to unit, rehab process, rehab schedule, fall prevention plan, rehab safety plan, health resource notebook, and nurse call system. Patient resting comfortably in bed with bed alarm on and call bell at patient side.

## 2018-09-06 NOTE — H&P (Signed)
Physical Medicine and Rehabilitation Admission H&P        Chief Complaint  Patient presents with  . 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 Presented 09/01/2020 [facility] with left facial droop and left-sided weakness as well as slurred speech. Per chart review patient lives with spouse. One level home with 5 steps to entry. Independent prior to admission. Wife works during the day. He does not drive. Cranial CT scan negative. Urine drug screen positive for cocaine. Patient did not receive TPA. Carotid Dopplers in no ICA stenosis. MRI/MRA showed positive for acute lacunar infarction of the posterior limb right internal capsule. No associated hemorrhage. MRA with no significant stenosis. Echocardiogram with ejection fraction of 65% grade 1 diastolic dysfunction no regional wall motion abnormalities. Maintain on aspirin and Plavix for CVA prophylaxis. Subcutaneous Lovenox for DVT prophylaxis. Follow-up speech therapy for dysphagia mechanical soft nectar thick liquids. Therapy evaluations completed and patient was admitted for a comprehensive rehabilitation program.   Review of Systems  Constitutional: Negative for chills and fever.  HENT: Negative for hearing loss.   Eyes: Negative for blurred vision and double vision.  Respiratory: Negative for cough and shortness of breath.   Cardiovascular: Negative for chest pain and palpitations.  Gastrointestinal: Positive for constipation. Negative for nausea and vomiting.  Genitourinary: Positive for urgency. Negative for dysuria, flank pain and hematuria.  Musculoskeletal: Positive for myalgias.  Skin: Negative for rash.  Neurological: Positive for dizziness, speech change and focal weakness.  All other systems reviewed and are negative.   Past Medical History:  Diagnosis  Date  . Diabetes 1.5, managed as type 2 (HCC)    . Diabetes mellitus without complication (HCC)    . Hypercholesterolemia    . Hypertension    . Pain in both feet    . Stroke [facility])      History reviewed. No pertinent surgical history.      Family History  Problem Relation Age of Onset  . Hypertension Mother    . Hypertension Father    . Diabetes Father      Social History:  reports that he has been smoking cigarettes. He started smoking about 38 years ago. He has been smoking about 0.25 packs per day. He has never used smokeless tobacco. He reports current alcohol use of about 1.0 standard drinks of alcohol per week. He reports that he does not use drugs. Allergies: No Known Allergies       Medications Prior to Admission  Medication Sig Dispense Refill  . amLODipine (NORVASC) 10 MG tablet Take 10 mg by mouth daily.      Marland Kitchen aspirin 81 MG EC tablet Take 1 tablet (81 mg total) by mouth daily. 30 tablet    . atorvastatin (LIPITOR) 40 MG tablet Take 1 tablet (40 mg total) by mouth every evening. 30 tablet 0  . gabapentin (NEURONTIN) 300 MG capsule Take 2 capsules (600 mg total) by mouth 3 (three) times daily. 180 capsule 2  . glipiZIDE (GLUCOTROL) 10 MG tablet Take 1 tablet (10 mg total) by mouth daily before breakfast. 30 tablet 3  . insulin aspart protamine- aspart (NOVOLOG MIX 70/30) (70-30) 100 UNIT/ML injection Inject 0.2 mLs (20 Units total) into the skin 2 (two) times daily with a meal. (Patient taking differently: Inject 15 Units into the skin daily with supper. )  10 mL 11  . lisinopril (PRINIVIL,ZESTRIL) 40 MG tablet Take 1 tablet (40 mg total) by mouth daily. 30 tablet 3  . metFORMIN (GLUCOPHAGE-XR) 500 MG 24 hr tablet Take 1,000 mg by mouth 2 (two) times daily with a meal.      . traMADol (ULTRAM) 50 MG tablet Take 1 tablet (50 mg total) by mouth every 6 (six) hours as needed. 20 tablet 0  . clopidogrel (PLAVIX) 75 MG tablet Take 1 tablet (75 mg total) by mouth daily. 30 tablet 0    . HYDROcodone-acetaminophen (NORCO/VICODIN) 5-325 MG tablet Take 1 tablet by mouth every 6 (six) hours as needed for moderate pain. (Patient not taking: Reported on 09/01/2018) 12 tablet 0  . meloxicam (MOBIC) 15 MG tablet Take 1 tablet (15 mg total) by mouth daily. (Patient not taking: Reported on 09/01/2018) 30 tablet 0  . predniSONE (DELTASONE) 10 MG tablet Take 1 tablet (10 mg total) by mouth daily. 6,5,4,3,2,1 six day taper (Patient not taking: Reported on 09/01/2018) 21 tablet 0      Drug Regimen Review  Drug regimen was reviewed and remains appropriate with no significant issues identified   Home: Home Living Family/patient expects to be discharged to:: Private residence Living Arrangements: Spouse/significant other Available Help at Discharge: Family, Available PRN/intermittently(wife works during the day) Type of Home: Mobile home Home Access: Stairs to enter Secretary/administratorntrance Stairs-Number of Steps: 5 Entrance Stairs-Rails: Left Home Layout: One level Bathroom Shower/Tub: Engineer, manufacturing systemsTub/shower unit Bathroom Toilet: Standard Bathroom Accessibility: Yes Home Equipment: None   Functional History: Prior Function Level of Independence: Independent Comments: Pt reports he is on diability for previous stroke but reports not residual deficits.  Pt ind wth ADLs and ambualtes without AD with no falls in the past 6 months.  Does not drive.     Functional Status:  Mobility: Bed Mobility Overal bed mobility: Needs Assistance Bed Mobility: Sit to Supine Supine to sit: Mod assist, Min assist, HOB elevated General bed mobility comments: Requires assist to mobilize LLE over edge of bed. Able to maintain sitting balance with RUE support with supervision. Transfers Overall transfer level: Needs assistance Equipment used: Hemi-walker Transfers: Sit to/from Stand Sit to Stand: +2 safety/equipment, Mod assist Stand pivot transfers: Max assist, +2 safety/equipment, Mod assist General transfer comment: Assist  to brace LLE and steady with rise. Demonstrates good effort to stand.  Ambulation/Gait Ambulation/Gait assistance: Mod assist Gait Distance (Feet): 4 Feet Assistive device: Hemi-walker Gait Pattern/deviations: Step-to pattern, Decreased step length - right, Decreased step length - left, Decreased stance time - left, Decreased dorsiflexion - left, Decreased weight shift to left(trunk foward and to L; encourage R support on hemi walker) General Gait Details: Sequence cues for all steps for proper weight shift, lean, steps and posture. Requires assist to maintain LLE knee extension with L weight bearing.  Gait velocity: slow   ADL: ADL Overall ADL's : Needs assistance/impaired Eating/Feeding: Set up, Bed level, Minimal assistance Eating/Feeding Details (indicate cue type and reason): Assist opening, and cutting food Grooming: Set up, Minimal assistance, Bed level, Wash/dry face(Applied Deodorant) Grooming Details (indicate cue type and reason): Full set-up required. Pt independent with application of deodorant to L side, however required Max a for application to R side. Discussed adaptive strategies and substitutions such as spray-on deodorant to improve independence with self-care. Upper Body Bathing: Set up, Minimal assistance, Cueing for compensatory techniques, Sitting Upper Body Bathing Details (indicate cue type and reason): Pt able to don T-shirt on this date using hemi-dressing strategies. Provided  Min A for straightening of shirt after pt pulled it over his head and non-affected side. Pt with increased time and effort for UB dressing task on this date. Required VC for use of compensatory strategies.  Lower Body Bathing: Set up, Maximal assistance Upper Body Dressing : Moderate assistance Lower Body Dressing: Maximal assistance Toilet Transfer: Maximal assistance, +2 for safety/equipment, +2 for physical assistance, BSC, Stand-pivot Toilet Transfer Details (indicate cue type and reason):  Assisted pt with use of BSC on this date. Pt required max assist +2 for safety and phyical assistance 2/2 LLE weakness.  Toileting- Clothing Manipulation and Hygiene: Maximal assistance, +2 for safety/equipment, +2 for physical assistance, Cueing for safety, Moderate assistance Toileting - Clothing Manipulation Details (indicate cue type and reason): Pt required mod/max assist for clothing manipulation on this date. Pt requiring assistance to pull pants/underwear down and pull up. Pt attempts to assist, however 2/2 LLE weakness is unable to maintain balance while completing clothing manipulation. VC provided for safety.    Cognition: Cognition Overall Cognitive Status: Within Functional Limits for tasks assessed Arousal/Alertness: Awake/alert Orientation Level: Oriented X4 Attention: Alternating, Divided, Selective, Sustained Sustained Attention: Impaired Behaviors: Impulsive Cognition Arousal/Alertness: Awake/alert Behavior During Therapy: WFL for tasks assessed/performed, Flat affect Overall Cognitive Status: Within Functional Limits for tasks assessed General Comments: Pt disoriented to time, believes it is Nov or Dec 2020.  Otherwise pt answering all questions appropriately and following commands.    Physical Exam: Blood pressure 125/63, pulse (!) 49, temperature 98.7 F (37.1 C), temperature source Oral, resp. rate 19, height 5\' 6"  (1.676 m), weight 68 kg, SpO2 100 %. Physical Exam  Constitutional: He is oriented to person, place, and time. He appears well-developed.  HENT:  Head: Normocephalic.  Edentulous, thrush on tongue  Neck: Normal range of motion.  Cardiovascular: Exam reveals no friction rub.  No murmur heard. IRR IRR  Respiratory: Effort normal and breath sounds normal. No respiratory distress. He has no wheezes. He has no rales.  GI: Soft. He exhibits no distension. There is no abdominal tenderness. There is no rebound.  Musculoskeletal: Normal range of motion.    Neurological: He is alert and oriented to person, place, and time.  Patient is alert. Left facial droop. Follows commands. Speech is dysarthric but intelligible. Visual fields appear intact. No obvious inattention on testing in bed. LUE trace pecs, 0/5 distally. LLE: 2 to 2+/5 HF,KE, 1+ APF, 0/5 ADF. RUE and RLE grossly 4-5/5. No focal sensory findings. DTR's brisk 3+ LUE and LLE, toes up  Psychiatric: He has a normal mood and affect. His behavior is normal.      Lab Results Last 48 Hours        Results for orders placed or performed during the hospital encounter of 09/01/18 (from the past 48 hour(s))  Glucose, capillary     Status: Abnormal    Collection Time: 09/04/18 11:39 AM  Result Value Ref Range    Glucose-Capillary 103 (H) 70 - 99 mg/dL    Comment 1 Notify RN      Comment 2 Document in Chart    Glucose, capillary     Status: Abnormal    Collection Time: 09/04/18  4:48 PM  Result Value Ref Range    Glucose-Capillary 183 (H) 70 - 99 mg/dL    Comment 1 Notify RN      Comment 2 Document in Chart    Glucose, capillary     Status: Abnormal    Collection Time: 09/04/18  9:21 PM  Result Value Ref Range    Glucose-Capillary 168 (H) 70 - 99 mg/dL    Comment 1 Notify RN    Glucose, capillary     Status: Abnormal    Collection Time: 09/05/18  7:55 AM  Result Value Ref Range    Glucose-Capillary 121 (H) 70 - 99 mg/dL    Comment 1 Notify RN      Comment 2 Document in Chart    Glucose, capillary     Status: None    Collection Time: 09/05/18  5:37 PM  Result Value Ref Range    Glucose-Capillary 87 70 - 99 mg/dL    Comment 1 Notify RN      Comment 2 Document in Chart    Glucose, capillary     Status: Abnormal    Collection Time: 09/05/18  8:55 PM  Result Value Ref Range    Glucose-Capillary 139 (H) 70 - 99 mg/dL  CBC     Status: Abnormal    Collection Time: 09/06/18  4:16 AM  Result Value Ref Range    WBC 5.3 4.0 - 10.5 K/uL    RBC 4.43 4.22 - 5.81 MIL/uL    Hemoglobin 12.9 (L)  13.0 - 17.0 g/dL    HCT 16.140.4 09.639.0 - 04.552.0 %    MCV 91.2 80.0 - 100.0 fL    MCH 29.1 26.0 - 34.0 pg    MCHC 31.9 30.0 - 36.0 g/dL    RDW 40.914.2 81.111.5 - 91.415.5 %    Platelets 276 150 - 400 K/uL    nRBC 0.0 0.0 - 0.2 %      Comment: Performed at [facility], [address] [place], [place] [address]  Creatinine, serum     Status: Abnormal    Collection Time: 09/06/18  4:16 AM  Result Value Ref Range    Creatinine, Ser 1.53 (H) 0.61 - 1.24 mg/dL    GFR calc non Af Amer 48 (L) >60 mL/min    GFR calc Af Amer 56 (L) >60 mL/min      Comment: Performed at [facility], [address] [place], [place] [address]  Glucose, capillary     Status: Abnormal    Collection Time: 09/06/18  8:01 AM  Result Value Ref Range    Glucose-Capillary 113 (H) 70 - 99 mg/dL    Comment 1 Notify RN      Comment 2 Document in Chart         Imaging Results (Last 48 hours)  Ct Head Wo Contrast   Result Date: 09/05/2018 CLINICAL DATA:  Followup right basal ganglia infarcts. EXAM: CT HEAD WITHOUT CONTRAST TECHNIQUE: Contiguous axial images were obtained from the base of the skull through the vertex without intravenous contrast. COMPARISON:  Head CT 09/04/2018 and MRI brain 09/01/2018. FINDINGS: Brain: Stable CT appearance of the small lacunar-type infarcts noted in the posterior limb of the right internal capsule. No complicating features such as hemorrhage are identified. No new/acute intracranial findings and no extra-axial fluid collections. Ventricles remain in the midline without mass effect or shift and are normal in size and configuration. The gray-white differentiation is maintained. The brainstem and cerebellum are unremarkable. Vascular: No significant findings. Skull: No significant findings. Sinuses/Orbits: The paranasal sinuses and mastoid air cells are clear. The globes are intact. Other: None. IMPRESSION: 1. Stable CT appearance of small lacunar type infarcts in the posterior limb of the  internal capsule on the right side. No complicating features. 2. No new/acute intracranial findings since  yesterday's head CT. Electronically Signed   By: Rudie Meyer M.D.   On: 09/05/2018 12:29    Ct Head Wo Contrast   Result Date: 09/04/2018 CLINICAL DATA:  Increasing left leg weakness EXAM: CT HEAD WITHOUT CONTRAST TECHNIQUE: Contiguous axial images were obtained from the base of the skull through the vertex without intravenous contrast. COMPARISON:  09/01/2018 MRI FINDINGS: Brain: Changes consistent with the known posterior limb internal capsule infarcts on the right are again seen and stable. No new focal infarct, hemorrhage or space-occupying mass lesion are seen. Vascular: No hyperdense vessel or unexpected calcification. Skull: Normal. Negative for fracture or focal lesion. Sinuses/Orbits: No acute finding. Other: None. IMPRESSION: Right posterior limb internal capsule infarct similar to that seen on prior MRI. Electronically Signed   By: Alcide Clever M.D.   On: 09/04/2018 17:22             Medical Problem List and Plan: 1.   Left side weakness with dysarthria/dysphagia secondary to lacunar infarction in the posterior limb right internal capsule             -admit to inpatient rehab 2.  DVT Prophylaxis/Anticoagulation: subcutaneous Lovenox. Monitor for any bleeding episodes 3. Pain Management: Neurontin 600 mg 3 times a day,tramadol as needed             -support while bed/chair to reduce sublux 4. Mood:  Provide emotional support 5. Neuropsych: This patient is capable of making decisions on his own behalf. 6. Skin/Wound Care:  Routine skin checks 7. Fluids/Electrolytes/Nutrition:  Routine in and out's with follow-up chemistries 8. Dysphagia. Dysphagia #3 nectar thick liquids. Follow-up speech therapy, advance as tolerated 9. Diabetes mellitus with peripheral neuropathy. Hemoglobin A1c 10.3. Glucotrol 10 mg daily, Levemir 12 units daily. Check blood sugars before meals and at bedtime.  Diabetic teaching 10. Hypertension. Lisinopril 40 mg daily. 11. Hyperlipidemia. Lipitor 12. Tobacco as well as polysubstance abuse. NicoDerm patch. Provide counseling   Post Admission Physician Evaluation: 1. Functional deficits secondary  to right PLIC infarct. 2. Patient is admitted to receive collaborative, interdisciplinary care between the physiatrist, rehab nursing staff, and therapy team. 3. Patient's level of medical complexity and substantial therapy needs in context of that medical necessity cannot be provided at a lesser intensity of care such as a SNF. 4. Patient has experienced substantial functional loss from his/her baseline which was documented above under the "Functional History" and "Functional Status" headings.  Judging by the patient's diagnosis, physical exam, and functional history, the patient has potential for functional progress which will result in measurable gains while on inpatient rehab.  These gains will be of substantial and practical use upon discharge  in facilitating mobility and self-care at the household level. 5. Physiatrist will provide 24 hour management of medical needs as well as oversight of the therapy plan/treatment and provide guidance as appropriate regarding the interaction of the two. 6. The Preadmission Screening has been reviewed and patient status is unchanged unless otherwise stated above. 7. 24 hour rehab nursing will assist with bladder management, bowel management, safety, skin/wound care, disease management, medication administration, pain management and patient education  and help integrate therapy concepts, techniques,education, etc. 8. PT will assess and treat for/with: Lower extremity strength, range of motion, stamina, balance, functional mobility, safety, adaptive techniques and equipment, NMR, visual-spatial awareness, family ed.   Goals are: supervision. 9. OT will assess and treat for/with: ADL's, functional mobility, safety, upper  extremity strength, adaptive techniques and equipment, NMR, visual-spatial awareness, family ed.  Goals are: supervision to min assist. Therapy may proceed with showering this patient. 10. SLP will assess and treat for/with: speech, communication, family ed.  Goals are: supervision to mod I. 11. Case Management and Social Worker will assess and treat for psychological issues and discharge planning. 12. Team conference will be held weekly to assess progress toward goals and to determine barriers to discharge. 13. Patient will receive at least 3 hours of therapy per day at least 5 days per week. 14. ELOS: 9-14 days       15. Prognosis:  excellent     I have personally performed a face to face diagnostic evaluation of this patient and formulated the key components of the plan.  Additionally, I have personally reviewed laboratory data, imaging studies, as well as relevant notes and concur with the physician assistant's documentation above.   Ranelle Oyster, MD, [place] [facility]     Mcarthur Rossetti Angiulli, PA-C 09/06/2018  The patient's status has not changed. The original post admission physician evaluation remains appropriate, and any changes from the pre-admission screening or documentation from the acute chart are noted above.  Ranelle Oyster, MD 09/06/2018

## 2018-09-06 NOTE — Evaluation (Signed)
Physical Therapy Assessment and Plan  Patient Details  Name: Evan Townsend. MRN: [id number] Date of Birth: August 20, 1955  PT Diagnosis: Abnormality of gait, Coordination disorder, Difficulty walking, Hemiplegia non-dominant and Muscle weakness Rehab Potential: Good ELOS: 14 days   Today's Date: 09/07/2018 PT Individual Time: 4854-6270 PT Individual Time Calculation (min): 56 min    Problem List:  Patient Active Problem List   Diagnosis Date Noted  . Lacunar infarct, acute ([place]) 09/06/2018  . TIA (transient ischemic attack) 09/01/2018  . Left leg pain 01/13/2018  . CVA (cerebral vascular accident) ([place]) 09/10/2017  . Hyperlipidemia 02/11/2017  . Acute CVA (cerebrovascular accident) ([place]) 11/10/2016  . Left sided numbness 11/10/2016  . Slurred speech 11/10/2016  . Essential hypertension 11/10/2016  . Diabetes ([employer]) 11/10/2016    Past Medical History:  Past Medical History:  Diagnosis Date  . Diabetes 1.5, managed as type 2 ([place])   . Diabetes mellitus without complication ([place])   . Hypercholesterolemia   . Hypertension   . Pain in both feet   . Stroke [facility])    Past Surgical History: History reviewed. No pertinent surgical history.  Assessment & Plan Clinical Impression: Patient is a 63 year old right-handed male history of CVA in the past with no residual deficits, diabetes mellitus, hypertension, hyperlipidemia, tobacco abuse. Presented 09/01/2020 [facility] with left facial droop and left-sided weakness as well as slurred speech. Per chart review patient lives with spouse. One level home with 5 steps to entry. Independent prior to admission. Wife works during the day. He does not drive. Cranial CT scan negative. Urine drug screen positive for cocaine. Patient did not receive TPA. Carotid Dopplers in no ICA stenosis. MRI/MRA showed positive for acute lacunar infarction of the posterior limb right internal capsule. No associated hemorrhage. MRA with no  significant stenosis. Echocardiogram with ejection fraction of 35% grade 1 diastolic dysfunction no regional wall motion abnormalities. Maintain on aspirin and Plavix for CVA prophylaxis. Subcutaneous Lovenox for DVT prophylaxis. Follow-up speech therapy for dysphagia mechanical soft nectar thick liquids. Patient transferred to CIR on 09/06/2018 .   Patient currently requires mod with mobility secondary to muscle weakness, decreased cardiorespiratoy endurance, unbalanced muscle activation, decreased coordination and decreased motor planning, decreased problem solving, decreased safety awareness and delayed processing and decreased sitting balance, decreased standing balance, decreased postural control, hemiplegia and decreased balance strategies.  Prior to hospitalization, patient was independent  with mobility and lived with Spouse in a Mobile home home.  Home access is 5Stairs to enter, Ramped entrance(reports stairs in front and ramp in back).  Patient will benefit from skilled PT intervention to maximize safe functional mobility, minimize fall risk and decrease caregiver burden for planned discharge home with 24 hour supervision.  Anticipate patient will benefit from follow up [place] at discharge.  PT - End of Session Activity Tolerance: Tolerates 10 - 20 min activity with multiple rests Endurance Deficit: Yes Endurance Deficit Description: decreased PT Assessment Rehab Potential (ACUTE/IP ONLY): Good PT Barriers to Discharge: Decreased caregiver support PT Barriers to Discharge Comments: wife works a few hours/day, family going to cover hours she is gone PT Patient demonstrates impairments in the following area(s): Balance;Endurance;Motor;Safety;Behavior PT Transfers Functional Problem(s): Bed Mobility;Bed to Chair;Car;Furniture;Floor PT Locomotion Functional Problem(s): Ambulation;Wheelchair Mobility;Stairs PT Plan PT Intensity: Minimum of 1-2 x/day ,45 to 90 minutes PT Frequency: 5 out of 7  days PT Duration Estimated Length of Stay: 14 days PT Treatment/Interventions: Ambulation/gait training;Community reintegration;DME/adaptive equipment instruction;Neuromuscular re-education;Psychosocial support;Stair training;UE/LE Strength taining/ROM;Wheelchair propulsion/positioning;UE/LE Coordination  activities;Therapeutic Activities;Skin care/wound management;Pain management;Functional electrical stimulation;Discharge planning;Balance/vestibular training;Cognitive remediation/compensation;Disease management/prevention;Functional mobility training;Patient/family education;Splinting/orthotics;Therapeutic Exercise;Visual/perceptual remediation/compensation PT Transfers Anticipated Outcome(s): supervision PT Locomotion Anticipated Outcome(s): supervision w/c propulsion, CGA household gait  PT Recommendation Follow Up Recommendations: Home health PT Patient destination: Home Equipment Recommended: To be determined  Skilled Therapeutic Intervention  Pt in supine and agreeable to therapy, denies pain. Performed functional mobility as detailed below including bed mobility, transfers, and w/c mobility. Car transfer w/ mod assist. Additionally performed gait at rail as detailed below. Needed min assist to block L knee in single leg stance and occasional min assist to progress LLE as well, mod assist for balance and postural control. Pre-gait sit<>stands to RW w/ min assist to standing and maintain standing, forward and backward stepping w/ BLEs to work on L quad and hip flexor activation. Returned to room total assist in w/c. Instructed pt in results of PT evaluation as detailed below, PT POC, rehab potential, rehab goals, and discharge recommendations. Additionally discussed CIR's policies regarding fall safety and use of chair alarm and/or quick release belt. Pt verbalized understanding and in agreement. Ended session in w/c, all needs in reach.  PT  Evaluation Precautions/Restrictions Precautions Precautions: Fall Restrictions Weight Bearing Restrictions: No Pain Pain Assessment Pain Scale: 0-10 Pain Score: 0-No pain Home Living/Prior Functioning Home Living Available Help at Discharge: Family;Available 24 hours/day(wife works a few hours/day, daughter does not work and family planning to cover hours wife is not there) Type of Home: Mobile home Home Access: Stairs to enter;Ramped entrance(reports stairs in front and ramp in back) Entrance Stairs-Number of Steps: 5 Entrance Stairs-Rails: Left Home Layout: One level Bathroom Shower/Tub: Chiropodist: Standard Bathroom Accessibility: Yes  Lives With: Spouse Prior Function Level of Independence: Independent with basic ADLs;Independent with gait;Independent with homemaking with ambulation;Independent with transfers  Able to Take Stairs?: Yes Driving: Yes Vocation: Full time employment 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 Range of Motion: Impaired-to be further tested in functional context Alignment/Gaze Preference: Gaze right Tracking/Visual Pursuits: Requires cues, head turns, or add eye shifts to track;Decreased smoothness of horizontal tracking;Decreased smoothness of vertical tracking Saccades: Within functional limits  Cognition Overall Cognitive Status: No family/caregiver present to determine baseline cognitive functioning Arousal/Alertness: Awake/alert Orientation Level: Oriented X4 Behaviors: Impulsive Comments: Mild impulsivity, decreased awareness of deficits Sensation Sensation Light Touch: Appears Intact(LEs intact) Coordination Gross Motor Movements are Fluid and Coordinated: No Fine Motor Movements are Fluid and Coordinated: No Heel Shin Test: LLE unable Motor  Motor Motor: Hemiplegia Motor - Skilled Clinical Observations: L hemi, UE>LE  Mobility Bed Mobility Bed  Mobility: Rolling Right;Rolling Left;Supine to Sit;Sit to Supine Rolling Right: Minimal Assistance - Patient > 75% Rolling Left: Minimal Assistance - Patient > 75% Supine to Sit: Moderate Assistance - Patient 50-74% Sit to Supine: Moderate Assistance - Patient 50-74% Transfers Transfers: Sit to Stand;Stand to Sit;Stand Pivot Transfers Sit to Stand: Minimal Assistance - Patient > 75% Stand to Sit: Minimal Assistance - Patient > 75% Stand Pivot Transfers: Moderate Assistance - Patient 50 - 74% Stand Pivot Transfer Details: Verbal cues for technique;Tactile cues for sequencing;Tactile cues for initiation;Tactile cues for placement;Tactile cues for weight beaing;Manual facilitation for weight shifting;Manual facilitation for placement Transfer (Assistive device): None Locomotion  Gait Ambulation: Yes Gait Assistance: Moderate Assistance - Patient 50-74% Gait Distance (Feet): 30 Feet Assistive device: Other (Comment)(rail in hallway) Gait Assistance Details: Tactile cues for sequencing;Tactile cues for weight shifting;Tactile cues for posture;Verbal cues for precautions/safety;Manual facilitation  for placement;Manual facilitation for weight shifting Gait Gait: Yes Gait Pattern: Impaired Gait Pattern: Poor foot clearance - right;Decreased dorsiflexion - left;Decreased weight shift to right Gait velocity: decreased Stairs / Additional Locomotion Stairs: No Wheelchair Mobility Wheelchair Mobility: Yes Wheelchair Assistance: Chartered loss adjuster: Right lower extremity;Right upper extremity Wheelchair Parts Management: Needs assistance Distance: 150'  Trunk/Postural Assessment  Cervical Assessment Cervical Assessment: Within Functional Limits Thoracic Assessment Thoracic Assessment: Within Functional Limits Lumbar Assessment Lumbar Assessment: Within Functional Limits Postural Control Postural Control: Deficits on evaluation(delayed/insufficient)   Balance Balance Balance Assessed: Yes Static Sitting Balance Static Sitting - Balance Support: No upper extremity supported;Feet supported Static Sitting - Level of Assistance: 5: Stand by assistance Dynamic Sitting Balance Dynamic Sitting - Balance Support: No upper extremity supported;Feet supported Dynamic Sitting - Level of Assistance: 4: Min Insurance risk surveyor Standing - Balance Support: Right upper extremity supported Static Standing - Level of Assistance: 4: Min assist Dynamic Standing Balance Dynamic Standing - Balance Support: Right upper extremity supported;During functional activity Dynamic Standing - Level of Assistance: 3: Mod assist Extremity Assessment  RLE Assessment RLE Assessment: Within Functional Limits LLE Assessment LLE Assessment: Exceptions to [facility] Passive Range of Motion (PROM) Comments: WFL LLE Strength LLE Overall Strength: Deficits Left Hip Flexion: 1/5 Left Hip Extension: 2+/5 Left Hip ABduction: 1/5 Left Hip ADduction: 1/5 Left Knee Flexion: 0/5 Left Knee Extension: 2-/5 Left Ankle Dorsiflexion: 0/5 Left Ankle Plantar Flexion: 0/5 Left Ankle Inversion: 0/5 Left Ankle Eversion: 0/5    Refer to Care Plan for Long Term Goals  Recommendations for other services: None   Discharge Criteria: Patient will be discharged from PT if patient refuses treatment 3 consecutive times without medical reason, if treatment goals not met, if there is a change in medical status, if patient makes no progress towards goals or if patient is discharged from hospital.  The above assessment, treatment plan, treatment alternatives and goals were discussed and mutually agreed upon: by patient  Clovia Reine K Ioannis Schuh 09/07/2018, 10:00 AM

## 2018-09-06 NOTE — Progress Notes (Signed)
Pt to be transferred to [place] rehab unit today. Report given to facility.  Iv and tele removed. Transport by care link

## 2018-09-06 NOTE — Progress Notes (Signed)
Inpatient Rehabilitation-Admissions Coordinator   Spoke with CM Victorino Dike via phone. Pt has a bed available at [facility] today. AC is coordinating with CM for DC to CIR today. Pt's wife, Meriam Sprague is requesting a call once transport has a time so she can follow it to [facility]; CM Victorino Dike is aware.   Please call if questions.   Nanine Means, OTR/L  Rehab Admissions Coordinator  [phone] 09/06/2018 9:01 AM

## 2018-09-06 NOTE — H&P (Signed)
Physical Medicine and Rehabilitation Admission H&P    Chief Complaint  Patient presents with  . 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 Presented 09/01/2020 [facility] with left facial droop and left-sided weakness as well as slurred speech. Per chart review patient lives with spouse. One level home with 5 steps to entry. Independent prior to admission. Wife works during the day. He does not drive. Cranial CT scan negative. Urine drug screen positive for cocaine. Patient did not receive TPA. Carotid Dopplers in no ICA stenosis. MRI/MRA showed positive for acute lacunar infarction of the posterior limb right internal capsule. No associated hemorrhage. MRA with no significant stenosis. Echocardiogram with ejection fraction of 65% grade 1 diastolic dysfunction no regional wall motion abnormalities. Maintain on aspirin and Plavix for CVA prophylaxis. Subcutaneous Lovenox for DVT prophylaxis. Follow-up speech therapy for dysphagia mechanical soft nectar thick liquids. Therapy evaluations completed and patient was admitted for a comprehensive rehabilitation program.  Review of Systems  Constitutional: Negative for chills and fever.  HENT: Negative for hearing loss.   Eyes: Negative for blurred vision and double vision.  Respiratory: Negative for cough and shortness of breath.   Cardiovascular: Negative for chest pain and palpitations.  Gastrointestinal: Positive for constipation. Negative for nausea and vomiting.  Genitourinary: Positive for urgency. Negative for dysuria, flank pain and hematuria.  Musculoskeletal: Positive for myalgias.  Skin: Negative for rash.  Neurological: Positive for dizziness, speech change and focal weakness.  All other systems reviewed and are negative.  Past Medical History:  Diagnosis Date  .  Diabetes 1.5, managed as type 2 (HCC)   . Diabetes mellitus without complication (HCC)   . Hypercholesterolemia   . Hypertension   . Pain in both feet   . Stroke [facility])    History reviewed. No pertinent surgical history. Family History  Problem Relation Age of Onset  . Hypertension Mother   . Hypertension Father   . Diabetes Father    Social History:  reports that he has been smoking cigarettes. He started smoking about 38 years ago. He has been smoking about 0.25 packs per day. He has never used smokeless tobacco. He reports current alcohol use of about 1.0 standard drinks of alcohol per week. He reports that he does not use drugs. Allergies: No Known Allergies Medications Prior to Admission  Medication Sig Dispense Refill  . amLODipine (NORVASC) 10 MG tablet Take 10 mg by mouth daily.    Marland Kitchen aspirin 81 MG EC tablet Take 1 tablet (81 mg total) by mouth daily. 30 tablet   . atorvastatin (LIPITOR) 40 MG tablet Take 1 tablet (40 mg total) by mouth every evening. 30 tablet 0  . gabapentin (NEURONTIN) 300 MG capsule Take 2 capsules (600 mg total) by mouth 3 (three) times daily. 180 capsule 2  . glipiZIDE (GLUCOTROL) 10 MG tablet Take 1 tablet (10 mg total) by mouth daily before breakfast. 30 tablet 3  . insulin aspart protamine- aspart (NOVOLOG MIX 70/30) (70-30) 100 UNIT/ML injection Inject 0.2 mLs (20 Units total) into the skin 2 (two) times daily with a meal. (Patient taking differently: Inject 15 Units into the skin daily with supper. ) 10 mL 11  . lisinopril (PRINIVIL,ZESTRIL) 40 MG tablet Take 1 tablet (40 mg total) by mouth daily. 30 tablet 3  . metFORMIN (GLUCOPHAGE-XR) 500 MG 24 hr tablet Take  1,000 mg by mouth 2 (two) times daily with a meal.    . traMADol (ULTRAM) 50 MG tablet Take 1 tablet (50 mg total) by mouth every 6 (six) hours as needed. 20 tablet 0  . clopidogrel (PLAVIX) 75 MG tablet Take 1 tablet (75 mg total) by mouth daily. 30 tablet 0  . HYDROcodone-acetaminophen  (NORCO/VICODIN) 5-325 MG tablet Take 1 tablet by mouth every 6 (six) hours as needed for moderate pain. (Patient not taking: Reported on 09/01/2018) 12 tablet 0  . meloxicam (MOBIC) 15 MG tablet Take 1 tablet (15 mg total) by mouth daily. (Patient not taking: Reported on 09/01/2018) 30 tablet 0  . predniSONE (DELTASONE) 10 MG tablet Take 1 tablet (10 mg total) by mouth daily. 6,5,4,3,2,1 six day taper (Patient not taking: Reported on 09/01/2018) 21 tablet 0    Drug Regimen Review  Drug regimen was reviewed and remains appropriate with no significant issues identified  Home: Home Living Family/patient expects to be discharged to:: Private residence Living Arrangements: Spouse/significant other Available Help at Discharge: Family, Available PRN/intermittently(wife works during the day) Type of Home: Mobile home Home Access: Stairs to enter Secretary/administrator of Steps: 5 Entrance Stairs-Rails: Left Home Layout: One level Bathroom Shower/Tub: Engineer, manufacturing systems: Standard Bathroom Accessibility: Yes Home Equipment: None   Functional History: Prior Function Level of Independence: Independent Comments: Pt reports he is on diability for previous stroke but reports not residual deficits.  Pt ind wth ADLs and ambualtes without AD with no falls in the past 6 months.  Does not drive.    Functional Status:  Mobility: Bed Mobility Overal bed mobility: Needs Assistance Bed Mobility: Sit to Supine Supine to sit: Mod assist, Min assist, HOB elevated General bed mobility comments: Requires assist to mobilize LLE over edge of bed. Able to maintain sitting balance with RUE support with supervision. Transfers Overall transfer level: Needs assistance Equipment used: Hemi-walker Transfers: Sit to/from Stand Sit to Stand: +2 safety/equipment, Mod assist Stand pivot transfers: Max assist, +2 safety/equipment, Mod assist General transfer comment: Assist to brace LLE and steady with rise.  Demonstrates good effort to stand.  Ambulation/Gait Ambulation/Gait assistance: Mod assist Gait Distance (Feet): 4 Feet Assistive device: Hemi-walker Gait Pattern/deviations: Step-to pattern, Decreased step length - right, Decreased step length - left, Decreased stance time - left, Decreased dorsiflexion - left, Decreased weight shift to left(trunk foward and to L; encourage R support on hemi walker) General Gait Details: Sequence cues for all steps for proper weight shift, lean, steps and posture. Requires assist to maintain LLE knee extension with L weight bearing.  Gait velocity: slow    ADL: ADL Overall ADL's : Needs assistance/impaired Eating/Feeding: Set up, Bed level, Minimal assistance Eating/Feeding Details (indicate cue type and reason): Assist opening, and cutting food Grooming: Set up, Minimal assistance, Bed level, Wash/dry face(Applied Deodorant) Grooming Details (indicate cue type and reason): Full set-up required. Pt independent with application of deodorant to L side, however required Max a for application to R side. Discussed adaptive strategies and substitutions such as spray-on deodorant to improve independence with self-care. Upper Body Bathing: Set up, Minimal assistance, Cueing for compensatory techniques, Sitting Upper Body Bathing Details (indicate cue type and reason): Pt able to don T-shirt on this date using hemi-dressing strategies. Provided Min A for straightening of shirt after pt pulled it over his head and non-affected side. Pt with increased time and effort for UB dressing task on this date. Required VC for use of compensatory strategies.  Lower  Body Bathing: Set up, Maximal assistance Upper Body Dressing : Moderate assistance Lower Body Dressing: Maximal assistance Toilet Transfer: Maximal assistance, +2 for safety/equipment, +2 for physical assistance, BSC, Stand-pivot Toilet Transfer Details (indicate cue type and reason): Assisted pt with use of BSC on  this date. Pt required max assist +2 for safety and phyical assistance 2/2 LLE weakness.  Toileting- Clothing Manipulation and Hygiene: Maximal assistance, +2 for safety/equipment, +2 for physical assistance, Cueing for safety, Moderate assistance Toileting - Clothing Manipulation Details (indicate cue type and reason): Pt required mod/max assist for clothing manipulation on this date. Pt requiring assistance to pull pants/underwear down and pull up. Pt attempts to assist, however 2/2 LLE weakness is unable to maintain balance while completing clothing manipulation. VC provided for safety.   Cognition: Cognition Overall Cognitive Status: Within Functional Limits for tasks assessed Arousal/Alertness: Awake/alert Orientation Level: Oriented X4 Attention: Alternating, Divided, Selective, Sustained Sustained Attention: Impaired Behaviors: Impulsive Cognition Arousal/Alertness: Awake/alert Behavior During Therapy: WFL for tasks assessed/performed, Flat affect Overall Cognitive Status: Within Functional Limits for tasks assessed General Comments: Pt disoriented to time, believes it is Nov or Dec 2020.  Otherwise pt answering all questions appropriately and following commands.   Physical Exam: Blood pressure 125/63, pulse (!) 49, temperature 98.7 F (37.1 C), temperature source Oral, resp. rate 19, height 5\' 6"  (1.676 m), weight 68 kg, SpO2 100 %. Physical Exam  Constitutional: He is oriented to person, place, and time. He appears well-developed.  HENT:  Head: Normocephalic.  Edentulous, thrush on tongue  Neck: Normal range of motion.  Cardiovascular: Exam reveals no friction rub.  No murmur heard. IRR IRR  Respiratory: Effort normal and breath sounds normal. No respiratory distress. He has no wheezes. He has no rales.  GI: Soft. He exhibits no distension. There is no abdominal tenderness. There is no rebound.  Musculoskeletal: Normal range of motion.  Neurological: He is alert and oriented  to person, place, and time.  Patient is alert. Left facial droop. Follows commands. Speech is dysarthric but intelligible. Visual fields appear intact. No obvious inattention on testing in bed. LUE trace pecs, 0/5 distally. LLE: 2 to 2+/5 HF,KE, 1+ APF, 0/5 ADF. RUE and RLE grossly 4-5/5. No focal sensory findings. DTR's brisk 3+ LUE and LLE, toes up  Psychiatric: He has a normal mood and affect. His behavior is normal.    Results for orders placed or performed during the hospital encounter of 09/01/18 (from the past 48 hour(s))  Glucose, capillary     Status: Abnormal   Collection Time: 09/04/18 11:39 AM  Result Value Ref Range   Glucose-Capillary 103 (H) 70 - 99 mg/dL   Comment 1 Notify RN    Comment 2 Document in Chart   Glucose, capillary     Status: Abnormal   Collection Time: 09/04/18  4:48 PM  Result Value Ref Range   Glucose-Capillary 183 (H) 70 - 99 mg/dL   Comment 1 Notify RN    Comment 2 Document in Chart   Glucose, capillary     Status: Abnormal   Collection Time: 09/04/18  9:21 PM  Result Value Ref Range   Glucose-Capillary 168 (H) 70 - 99 mg/dL   Comment 1 Notify RN   Glucose, capillary     Status: Abnormal   Collection Time: 09/05/18  7:55 AM  Result Value Ref Range   Glucose-Capillary 121 (H) 70 - 99 mg/dL   Comment 1 Notify RN    Comment 2 Document in Chart  Glucose, capillary     Status: None   Collection Time: 09/05/18  5:37 PM  Result Value Ref Range   Glucose-Capillary 87 70 - 99 mg/dL   Comment 1 Notify RN    Comment 2 Document in Chart   Glucose, capillary     Status: Abnormal   Collection Time: 09/05/18  8:55 PM  Result Value Ref Range   Glucose-Capillary 139 (H) 70 - 99 mg/dL  CBC     Status: Abnormal   Collection Time: 09/06/18  4:16 AM  Result Value Ref Range   WBC 5.3 4.0 - 10.5 K/uL   RBC 4.43 4.22 - 5.81 MIL/uL   Hemoglobin 12.9 (L) 13.0 - 17.0 g/dL   HCT 16.1 09.6 - 04.5 %   MCV 91.2 80.0 - 100.0 fL   MCH 29.1 26.0 - 34.0 pg   MCHC 31.9  30.0 - 36.0 g/dL   RDW 40.9 81.1 - 91.4 %   Platelets 276 150 - 400 K/uL   nRBC 0.0 0.0 - 0.2 %    Comment: Performed at [facility], [address] [place], [place] [address]  Creatinine, serum     Status: Abnormal   Collection Time: 09/06/18  4:16 AM  Result Value Ref Range   Creatinine, Ser 1.53 (H) 0.61 - 1.24 mg/dL   GFR calc non Af Amer 48 (L) >60 mL/min   GFR calc Af Amer 56 (L) >60 mL/min    Comment: Performed at [facility], [address] [place], [place] [address]  Glucose, capillary     Status: Abnormal   Collection Time: 09/06/18  8:01 AM  Result Value Ref Range   Glucose-Capillary 113 (H) 70 - 99 mg/dL   Comment 1 Notify RN    Comment 2 Document in Chart    Ct Head Wo Contrast  Result Date: 09/05/2018 CLINICAL DATA:  Followup right basal ganglia infarcts. EXAM: CT HEAD WITHOUT CONTRAST TECHNIQUE: Contiguous axial images were obtained from the base of the skull through the vertex without intravenous contrast. COMPARISON:  Head CT 09/04/2018 and MRI brain 09/01/2018. FINDINGS: Brain: Stable CT appearance of the small lacunar-type infarcts noted in the posterior limb of the right internal capsule. No complicating features such as hemorrhage are identified. No new/acute intracranial findings and no extra-axial fluid collections. Ventricles remain in the midline without mass effect or shift and are normal in size and configuration. The gray-white differentiation is maintained. The brainstem and cerebellum are unremarkable. Vascular: No significant findings. Skull: No significant findings. Sinuses/Orbits: The paranasal sinuses and mastoid air cells are clear. The globes are intact. Other: None. IMPRESSION: 1. Stable CT appearance of small lacunar type infarcts in the posterior limb of the internal capsule on the right side. No complicating features. 2. No new/acute intracranial findings since yesterday's head CT. Electronically Signed   By: Rudie Meyer M.D.    On: 09/05/2018 12:29   Ct Head Wo Contrast  Result Date: 09/04/2018 CLINICAL DATA:  Increasing left leg weakness EXAM: CT HEAD WITHOUT CONTRAST TECHNIQUE: Contiguous axial images were obtained from the base of the skull through the vertex without intravenous contrast. COMPARISON:  09/01/2018 MRI FINDINGS: Brain: Changes consistent with the known posterior limb internal capsule infarcts on the right are again seen and stable. No new focal infarct, hemorrhage or space-occupying mass lesion are seen. Vascular: No hyperdense vessel or unexpected calcification. Skull: Normal. Negative for fracture or focal lesion. Sinuses/Orbits: No acute finding. Other: None. IMPRESSION: Right posterior limb internal capsule infarct  similar to that seen on prior MRI. Electronically Signed   By: Alcide CleverMark  Lukens M.D.   On: 09/04/2018 17:22       Medical Problem List and Plan: 1.   Left side weakness with dysarthria/dysphagia secondary to lacunar infarction in the posterior limb right internal capsule  -admit to inpatient rehab 2.  DVT Prophylaxis/Anticoagulation: subcutaneous Lovenox. Monitor for any bleeding episodes 3. Pain Management: Neurontin 600 mg 3 times a day,tramadol as needed  -support while bed/chair to reduce sublux 4. Mood:  Provide emotional support 5. Neuropsych: This patient is capable of making decisions on his own behalf. 6. Skin/Wound Care:  Routine skin checks 7. Fluids/Electrolytes/Nutrition:  Routine in and out's with follow-up chemistries 8. Dysphagia. Dysphagia #3 nectar thick liquids. Follow-up speech therapy, advance as tolerated 9. Diabetes mellitus with peripheral neuropathy. Hemoglobin A1c 10.3. Glucotrol 10 mg daily, Levemir 12 units daily. Check blood sugars before meals and at bedtime. Diabetic teaching 10. Hypertension. Lisinopril 40 mg daily. 11. Hyperlipidemia. Lipitor 12. Tobacco as well as polysubstance abuse. NicoDerm patch. Provide counseling  Post Admission Physician  Evaluation: 1. Functional deficits secondary  to right PLIC infarct. 2. Patient is admitted to receive collaborative, interdisciplinary care between the physiatrist, rehab nursing staff, and therapy team. 3. Patient's level of medical complexity and substantial therapy needs in context of that medical necessity cannot be provided at a lesser intensity of care such as a SNF. 4. Patient has experienced substantial functional loss from his/her baseline which was documented above under the "Functional History" and "Functional Status" headings.  Judging by the patient's diagnosis, physical exam, and functional history, the patient has potential for functional progress which will result in measurable gains while on inpatient rehab.  These gains will be of substantial and practical use upon discharge  in facilitating mobility and self-care at the household level. 5. Physiatrist will provide 24 hour management of medical needs as well as oversight of the therapy plan/treatment and provide guidance as appropriate regarding the interaction of the two. 6. The Preadmission Screening has been reviewed and patient status is unchanged unless otherwise stated above. 7. 24 hour rehab nursing will assist with bladder management, bowel management, safety, skin/wound care, disease management, medication administration, pain management and patient education  and help integrate therapy concepts, techniques,education, etc. 8. PT will assess and treat for/with: Lower extremity strength, range of motion, stamina, balance, functional mobility, safety, adaptive techniques and equipment, NMR, visual-spatial awareness, family ed.   Goals are: supervision. 9. OT will assess and treat for/with: ADL's, functional mobility, safety, upper extremity strength, adaptive techniques and equipment, NMR, visual-spatial awareness, family ed.   Goals are: supervision to min assist. Therapy may proceed with showering this patient. 10. SLP will  assess and treat for/with: speech, communication, family ed.  Goals are: supervision to mod I. 11. Case Management and Social Worker will assess and treat for psychological issues and discharge planning. 12. Team conference will be held weekly to assess progress toward goals and to determine barriers to discharge. 13. Patient will receive at least 3 hours of therapy per day at least 5 days per week. 14. ELOS: 9-14 days       15. Prognosis:  excellent   I have personally performed a face to face diagnostic evaluation of this patient and formulated the key components of the plan.  Additionally, I have personally reviewed laboratory data, imaging studies, as well as relevant notes and concur with the physician assistant's documentation above.  Ranelle OysterZachary T. Swartz, MD, [place] DomFAAPMR  Mcarthur RossettiDaniel J Angiulli, PA-C 09/06/2018

## 2018-09-06 NOTE — Progress Notes (Signed)
Ch initiated a visit w/ pt thru speaking w/ wife who seemed to be in nervous while waiting on pt to complete PT. Pt and wife mentioned that they were in the process of moving and both are concerned about the pt being transported to [facility]. Ch provided compassionate present and allowed pt and wife to engage in storytelling and also their concerns about the next few weeks. Ch prayed with them and they were thankful for the time the ch spent with them. F/u not needed as the pt will be going to another facility.    09/06/18 1105  Clinical Encounter Type  Visited With Patient and family together  Visit Type Initial;Spiritual support;Social support  Referral From Chaplain  Consult/Referral To Chaplain  Spiritual Encounters  Spiritual Needs Sacred text;Prayer;Ritual;Emotional;Grief support  Stress Factors  Patient Stress Factors Exhausted;Family relationships;Financial concerns;Health changes;Loss;Major life changes  Family Stress Factors Family relationships;Financial concerns;Loss;Major life changes

## 2018-09-06 NOTE — Progress Notes (Signed)
Physical Therapy Treatment Patient Details Name: Evan Townsend Sr. MRN: [id number] DOB: [birth year] Today's Date: 09/06/2018    History of Present Illness Pt is a 63 y/o M who presented with L facial droop, L sided weakness, numbness, and dysarthria.  Head CT negative.  MRI revealed R BG lacunar infarct.  Pt's PMH includes stroke.     PT Comments    Participated in exercises as described below.  To edge of bed with mod a x 1 where emphasis was on static and dynamic sitting balance activities.  Stood with max a x 1 with blocking of L knee to prevent buckling.  He was able to stand for a short period with time given to obtain balance before transferring with max a x 1 to recliner at bedside.  Pt pleased to be out of bed.  Awaiting transport to [facility] today.   Follow Up Recommendations  CIR     Equipment Recommendations       Recommendations for Other Services       Precautions / Restrictions Precautions Precautions: Fall Restrictions Weight Bearing Restrictions: No    Mobility  Bed Mobility Overal bed mobility: Needs Assistance Bed Mobility: Supine to Sit     Supine to sit: Mod assist     General bed mobility comments: verbal cues for LLE/UE awarness and correct hand placements to make transition easier.  Transfers Overall transfer level: Needs assistance Equipment used: 1 person hand held assist Transfers: Sit to/from Stand Sit to Stand: Max assist Stand pivot transfers: Max assist       General transfer comment: Pt able to stand and transfer to recliner at bedside with +1 max assist.  Pt with heavy reliance on writer for transfer and to prevent fall.  Ambulation/Gait             General Gait Details: unable   Stairs             Wheelchair Mobility    Modified Rankin (Stroke Patients Only)       Balance Overall balance assessment: Needs assistance Sitting-balance support: Single extremity supported Sitting balance-Leahy Scale: Fair Sitting  balance - Comments: Pt able to sit unsupported statically but when reaching outside BOS requires increased assistance and guarding.  Falls to left, Postural control: Left lateral lean;Posterior lean Standing balance support: Single extremity supported Standing balance-Leahy Scale: Zero                              Cognition Arousal/Alertness: Awake/alert Behavior During Therapy: WFL for tasks assessed/performed;Flat affect Overall Cognitive Status: Within Functional Limits for tasks assessed                                        Exercises Other Exercises Other Exercises: seated static and dynamic balance activities including reaching left, right and forward.  Poor tracking with eyes to left. Other Exercises: Supine LLE ankle pumps, heel slides, SLR, ab/add x 10,  bridges x 40 with varied foot position.    General Comments        Pertinent Vitals/Pain Pain Assessment: No/denies pain    Home Living                      Prior Function            PT Goals (current goals can now  be found in the care plan section) Progress towards PT goals: Progressing toward goals    Frequency    7X/week      PT Plan Current plan remains appropriate    Co-evaluation              AM-PAC PT "6 Clicks" Mobility   Outcome Measure  Help needed turning from your back to your side while in a flat bed without using bedrails?: A Lot Help needed moving from lying on your back to sitting on the side of a flat bed without using bedrails?: A Lot Help needed moving to and from a bed to a chair (including a wheelchair)?: Total Help needed standing up from a chair using your arms (e.g., wheelchair or bedside chair)?: Total Help needed to walk in hospital room?: Total Help needed climbing 3-5 steps with a railing? 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

## 2018-09-06 NOTE — Discharge Summary (Signed)
CORNELL BOURBON Sr., is a 63 y.o. male  DOB 24-Jul-1956  MRN [id number].  Admission date:  09/01/2018  Admitting Physician  Ramonita Lab, MD  Discharge Date:  09/06/2018   Primary MD  Patient, No Pcp Per  Recommendations for primary care physician for things to follow:   Discharged to [facility].   Admission Diagnosis  TIA (transient ischemic attack) [G45.9] Cerebrovascular accident (CVA), unspecified mechanism (HCC) [I63.9]   Discharge Diagnosis  TIA (transient ischemic attack) [G45.9] Cerebrovascular accident (CVA), unspecified mechanism (HCC) [I63.9]    Active Problems:   TIA (transient ischemic attack)      Past Medical History:  Diagnosis Date  . Diabetes 1.5, managed as type 2 (HCC)   . Diabetes mellitus without complication (HCC)   . Hypercholesterolemia   . Hypertension   . Pain in both feet   . Stroke [facility])     History reviewed. No pertinent surgical history.     History of present illness and  Hospital Course:     Kindly see H&P for history of present illness and admission details, please review complete Labs, Consult reports and Test reports for all details in brief  HPI  from the history and physical done on the day of admission 63 year old patient with history of CVA with no residual deficits, diabetes mellitus, hypertension, hyperlipidemia came to ER because of left facial droop, left-sided weakness with slurred speech, admitted for stroke evaluation.  Initial CT head did not show acute changes.   Hospital Course   Acute right basal ganglia stroke, initial CT head negative, MRI of the brain showed acute lacunar infarct in the right internal capsule, patient also has chronic small vessel ischemic changes.  Carotid ultrasound showed no hemodynamically signal stenosis.  Cardiogram showed EF 65%, cardiac  source of emboli.  Patient LDL is 52.  Is on aspirin, Plavix, statins, physical therapy recommends inpatient rehab.  He will go to [facility] inpatient rehab today.  Continue aspirin 81 mg daily, atorvastatin 80 mg p.o. in the evening, Plavix 75 mg p.o. daily. 2.  Diabetes mellitus type 2: Patient is on glipizide 10 mg daily, Levemir 12 units daily.  Hold metformin, insulin 70/30 that he was taking at home before he came.   3.  Acute kidney injury likely secondary to poor p.o. intake due to stroke, and dehydration with ATN: Hold on the lisinopril for now.  Can get the BMP in 2 days and resume lisinopril at a lower dose.  Metformin also at discharge due to acute kidney injury. #4 essential hypertension; elevated permissive hypertension due to stroke' we are stopping lisinopril secondary to acute kidney injury but patient is on mask at home, resume Norvasc. 5.  Sinus bradycardia, watch heart rate is around 50 to 70s.  No symptoms.  Discharge Condition: Stable  Follow UP    Dysphagia 3 (Mech soft) solids;Nectar thick liquid   Liquid Administration via Cup;Straw  Medication Administration Whole meds with puree  Compensations Minimize environmental distractions;Slow rate;Small sips/bites;Lingual sweep for clearance of pocketing;Multiple dry swallows after each bite/sip;Follow solids with liquid  Postural Changes Remain semi-upright after after feeds/meals (Comment);Seated upright at 90 degrees      Discharge Instructions  and  Discharge Medications      Allergies as of 09/06/2018   No Known Allergies     Medication List    STOP taking these medications   HYDROcodone-acetaminophen 5-325 MG tablet Commonly known as:  NORCO/VICODIN   insulin aspart protamine- aspart (70-30) 100 UNIT/ML  injection Commonly known as:  NOVOLOG MIX 70/30   lisinopril 40 MG tablet Commonly known as:  PRINIVIL,ZESTRIL   meloxicam 15 MG tablet Commonly known as:  MOBIC   metFORMIN 500 MG 24 hr  tablet Commonly known as:  GLUCOPHAGE-XR   predniSONE 10 MG tablet Commonly known as:  DELTASONE   traMADol 50 MG tablet Commonly known as:  ULTRAM     TAKE these medications   amLODipine 10 MG tablet Commonly known as:  NORVASC Take 10 mg by mouth daily.   aspirin 81 MG EC tablet Take 1 tablet (81 mg total) by mouth daily.   atorvastatin 80 MG tablet Commonly known as:  LIPITOR Take 1 tablet (80 mg total) by mouth every evening. What changed:    medication strength  how much to take   clopidogrel 75 MG tablet Commonly known as:  PLAVIX Take 1 tablet (75 mg total) by mouth daily.   gabapentin 300 MG capsule Commonly known as:  NEURONTIN Take 2 capsules (600 mg total) by mouth 3 (three) times daily.   glipiZIDE 10 MG tablet Commonly known as:  GLUCOTROL Take 1 tablet (10 mg total) by mouth daily before breakfast.   insulin aspart 100 UNIT/ML injection Commonly known as:  novoLOG Inject 0-5 Units into the skin at bedtime.   insulin aspart 100 UNIT/ML injection Commonly known as:  novoLOG Inject 0-15 Units into the skin 3 (three) times daily with meals.   insulin detemir 100 UNIT/ML injection Commonly known as:  LEVEMIR Inject 0.12 mLs (12 Units total) into the skin daily.         Diet and Activity recommendation: See Discharge Instructions above   Consults obtained -neurology, physical therapy. Major procedures and Radiology Reports - PLEASE review detailed and final reports for all details, in brief -     Ct Head Wo Contrast  Result Date: 09/05/2018 CLINICAL DATA:  Followup right basal ganglia infarcts. EXAM: CT HEAD WITHOUT CONTRAST TECHNIQUE: Contiguous axial images were obtained from the base of the skull through the vertex without intravenous contrast. COMPARISON:  Head CT 09/04/2018 and MRI brain 09/01/2018. FINDINGS: Brain: Stable CT appearance of the small lacunar-type infarcts noted in the posterior limb of the right internal capsule. No  complicating features such as hemorrhage are identified. No new/acute intracranial findings and no extra-axial fluid collections. Ventricles remain in the midline without mass effect or shift and are normal in size and configuration. The gray-white differentiation is maintained. The brainstem and cerebellum are unremarkable. Vascular: No significant findings. Skull: No significant findings. Sinuses/Orbits: The paranasal sinuses and mastoid air cells are clear. The globes are intact. Other: None. IMPRESSION: 1. Stable CT appearance of small lacunar type infarcts in the posterior limb of the internal capsule on the right side. No complicating features. 2. No new/acute intracranial findings since yesterday's head CT. Electronically Signed   By: Rudie Meyer M.D.   On: 09/05/2018 12:29   Ct Head Wo Contrast  Result Date: 09/04/2018 CLINICAL DATA:  Increasing left leg weakness EXAM: CT HEAD WITHOUT CONTRAST TECHNIQUE: Contiguous axial images were obtained from the base of the skull through the vertex without intravenous contrast. COMPARISON:  09/01/2018 MRI FINDINGS: Brain: Changes consistent with the known posterior limb internal capsule infarcts on the right are again seen and stable. No new focal infarct, hemorrhage or space-occupying mass lesion are seen. Vascular: No hyperdense vessel or unexpected calcification. Skull: Normal. Negative for fracture or focal lesion. Sinuses/Orbits: No acute finding. Other: None. IMPRESSION: Right posterior  limb internal capsule infarct similar to that seen on prior MRI. Electronically Signed   By: Alcide CleverMark  Lukens M.D.   On: 09/04/2018 17:22   Ct Head Wo Contrast  Result Date: 09/01/2018 CLINICAL DATA:  Left facial droop and left-sided weakness. Suspect stroke. EXAM: CT HEAD WITHOUT CONTRAST TECHNIQUE: Contiguous axial images were obtained from the base of the skull through the vertex without intravenous contrast. COMPARISON:  MRI head 09/10/2017, CT head 09/10/2017 FINDINGS:  Brain: Ventricle size normal. Patchy hypodensity in the cerebral white matter bilaterally appears unchanged. Small chronic infarct in the left thalamus unchanged. Negative for acute infarct, hemorrhage, or mass. Vascular: Negative for hyperdense vessel Skull: Negative Sinuses/Orbits: Mucosal edema right maxillary sinus.  Normal orbit Other: None IMPRESSION: No acute intracranial abnormality. Chronic microvascular ischemic changes similar to the prior study. Electronically Signed   By: Marlan Palauharles  Clark M.D.   On: 09/01/2018 09:23   Mr Brain Wo Contrast  Result Date: 09/01/2018 CLINICAL DATA:  63 year old male presents with new left side weakness facial droop and slurred speech. EXAM: MRI HEAD WITHOUT CONTRAST MRA HEAD WITHOUT CONTRAST TECHNIQUE: Multiplanar, multiecho pulse sequences of the brain and surrounding structures were obtained without intravenous contrast. Angiographic images of the head were obtained using MRA technique without contrast. COMPARISON:  Brain MRI and intracranial MRA 09/10/2017 and earlier. FINDINGS: MRI HEAD FINDINGS Brain: Linear 12 millimeter acute lacunar infarct of the posterior limb right internal capsule (series 5, image 27). Mild T2 and FLAIR hyperintensity without hemorrhage or mass effect. No other restricted diffusion. Expected evolution of the January 2019 left thalamic lacune, and multiple chronic lacunar infarcts in the bilateral deep gray matter and corona radiata. Occasional chronic micro hemorrhages. Hemosiderin also in the right periatrial white matter. Chronic lacune suspected also in the left midbrain. No cortical encephalomalacia. No midline shift, mass effect, evidence of mass lesion, ventriculomegaly, extra-axial collection or acute intracranial hemorrhage. Cervicomedullary junction and pituitary are within normal limits. Vascular: Major intracranial vascular flow voids are stable. Skull and upper cervical spine: Negative visible cervical spine. Visualized bone marrow  signal is within normal limits. Sinuses/Orbits: Bilateral maxillary sinus mucosal thickening. Negative orbits. Other: Mastoids are clear. Visible internal auditory structures appear normal. Scalp and face soft tissues appear negative. MRA HEAD FINDINGS Antegrade flow in the posterior circulation with stable, negative distal vertebral arteries and basilar. The distal left vertebral is mildly dominant. Patent bilateral PICA origins. Patent SCA and PCA origins. Fetal type right PCA with left posterior communicating artery also present. Mild bilateral PCA branch irregularity has not significantly changed. Antegrade flow in both ICA siphons appears stable. There is widespread siphon irregularity in keeping with atherosclerosis. Mild bilateral supraclinoid segment stenosis. Normal ophthalmic artery origins. Patent carotid termini. Normal MCA and ACA origins. Stable bilateral MCA and ACA branches with mild irregularity throughout. Negative anterior communicating artery. IMPRESSION: 1. Positive for acute lacunar infarct of the posterior limb right internal capsule. No associated hemorrhage or mass effect. 2. Underlying advanced chronic small vessel disease otherwise stable since January 2019. Occasional associated chronic micro-hemorrhages. 3. Stable intracranial MRA since 2019 with widespread intracranial atherosclerosis but no hemodynamically significant stenosis. Electronically Signed   By: Odessa FlemingH  Hall M.D.   On: 09/01/2018 15:41   [place] Carotid Bilateral (at [facility] Only)  Result Date: 09/01/2018 CLINICAL DATA:  Stroke/TIA. History of hypertension, hyperlipidemia, diabetes and smoking EXAM: BILATERAL CAROTID DUPLEX ULTRASOUND TECHNIQUE: Wallace CullensGray scale imaging, color Doppler and duplex ultrasound were performed of bilateral carotid and vertebral arteries in the neck.  COMPARISON:  None. FINDINGS: Criteria: Quantification of carotid stenosis is based on velocity parameters that correlate the residual internal carotid diameter  with NASCET-based stenosis levels, using the diameter of the distal internal carotid lumen as the denominator for stenosis measurement. The following velocity measurements were obtained: RIGHT ICA: 94/29 cm/sec CCA: 85/18 cm/sec SYSTOLIC ICA/CCA RATIO:  1.1 ECA: 97 cm/sec LEFT ICA: 85/31 cm/sec CCA: 87/20 cm/sec SYSTOLIC ICA/CCA RATIO:  1.2 ECA: 65 cm/sec RIGHT CAROTID ARTERY: There is a minimal to moderate amount of intimal thickening/hypoechoic plaque within the right carotid bulb (images 15 and 18), extending to involve the origin and proximal aspects of the right internal carotid artery (image 22), not resulting in elevated peak systolic velocities within the interrogated course the right internal carotid artery to suggest a hemodynamically significant stenosis. RIGHT VERTEBRAL ARTERY:  Antegrade flow LEFT CAROTID ARTERY: There is no grayscale evidence of significant intimal thickening or atherosclerotic plaque affecting the interrogated portions of the left carotid system. There are no elevated peak systolic velocities within the interrogated course of the left internal carotid artery to suggest a hemodynamically significant stenosis. LEFT VERTEBRAL ARTERY:  Antegrade Flow IMPRESSION: 1. Minimal to moderate amount of right-sided atherosclerotic plaque, not resulting in a hemodynamically significant stenosis. 2. Normal sonographic evaluation of the left carotid system. Electronically Signed   By: Simonne Come M.D.   On: 09/01/2018 12:12   Mr Maxine Glenn Head/brain ZO Cm  Result Date: 09/01/2018 CLINICAL DATA:  63 year old male presents with new left side weakness facial droop and slurred speech. EXAM: MRI HEAD WITHOUT CONTRAST MRA HEAD WITHOUT CONTRAST TECHNIQUE: Multiplanar, multiecho pulse sequences of the brain and surrounding structures were obtained without intravenous contrast. Angiographic images of the head were obtained using MRA technique without contrast. COMPARISON:  Brain MRI and intracranial MRA  09/10/2017 and earlier. FINDINGS: MRI HEAD FINDINGS Brain: Linear 12 millimeter acute lacunar infarct of the posterior limb right internal capsule (series 5, image 27). Mild T2 and FLAIR hyperintensity without hemorrhage or mass effect. No other restricted diffusion. Expected evolution of the January 2019 left thalamic lacune, and multiple chronic lacunar infarcts in the bilateral deep gray matter and corona radiata. Occasional chronic micro hemorrhages. Hemosiderin also in the right periatrial white matter. Chronic lacune suspected also in the left midbrain. No cortical encephalomalacia. No midline shift, mass effect, evidence of mass lesion, ventriculomegaly, extra-axial collection or acute intracranial hemorrhage. Cervicomedullary junction and pituitary are within normal limits. Vascular: Major intracranial vascular flow voids are stable. Skull and upper cervical spine: Negative visible cervical spine. Visualized bone marrow signal is within normal limits. Sinuses/Orbits: Bilateral maxillary sinus mucosal thickening. Negative orbits. Other: Mastoids are clear. Visible internal auditory structures appear normal. Scalp and face soft tissues appear negative. MRA HEAD FINDINGS Antegrade flow in the posterior circulation with stable, negative distal vertebral arteries and basilar. The distal left vertebral is mildly dominant. Patent bilateral PICA origins. Patent SCA and PCA origins. Fetal type right PCA with left posterior communicating artery also present. Mild bilateral PCA branch irregularity has not significantly changed. Antegrade flow in both ICA siphons appears stable. There is widespread siphon irregularity in keeping with atherosclerosis. Mild bilateral supraclinoid segment stenosis. Normal ophthalmic artery origins. Patent carotid termini. Normal MCA and ACA origins. Stable bilateral MCA and ACA branches with mild irregularity throughout. Negative anterior communicating artery. IMPRESSION: 1. Positive for  acute lacunar infarct of the posterior limb right internal capsule. No associated hemorrhage or mass effect. 2. Underlying advanced chronic small vessel disease otherwise stable since  January 2019. Occasional associated chronic micro-hemorrhages. 3. Stable intracranial MRA since 2019 with widespread intracranial atherosclerosis but no hemodynamically significant stenosis. Electronically Signed   By: Odessa Fleming M.D.   On: 09/01/2018 15:41    Micro Results    No results found for this or any previous visit (from the past 240 hour(s)).     Today   Subjective:   Ly Stencil has left-sided weakness secondary to right basal ganglia stroke, stable for discharge to inpatient rehab.  Objective:   Blood pressure 125/63, pulse (!) 49, temperature 98.7 F (37.1 C), temperature source Oral, resp. rate 19, height 5\' 6"  (1.676 m), weight 68 kg, SpO2 100 %.  No intake or output data in the 24 hours ending 09/06/18 0917  Exam Awake Alert, Oriented x 3, No new F.N deficits, Normal affect [place].AT,PERRAL Supple Neck,No JVD, No cervical lymphadenopathy appriciated.  Symmetrical Chest wall movement, Good air movement bilaterally, CTAB RRR,No Gallops,Rubs or new Murmurs, No Parasternal Heave +ve B.Sounds, Abd Soft, Non tender, No organomegaly appriciated, No rebound -guarding or rigidity. No Cyanosis, Clubbing or edema, No new Rash or bruise  Data Review   CBC w Diff:  Lab Results  Component Value Date   WBC 5.3 09/06/2018   HGB 12.9 (L) 09/06/2018   HGB 15.6 03/15/2013   HCT 40.4 09/06/2018   HCT 45.7 03/15/2013   PLT 276 09/06/2018   PLT 188 03/15/2013   LYMPHOPCT 33 09/01/2018   LYMPHOPCT 44.9 03/15/2013   MONOPCT 8 09/01/2018   MONOPCT 8.1 03/15/2013   EOSPCT 2 09/01/2018   EOSPCT 1.6 03/15/2013   BASOPCT 1 09/01/2018   BASOPCT 0.9 03/15/2013    CMP:  Lab Results  Component Value Date   NA 138 09/01/2018   NA 144 02/10/2018   NA 135 (L) 03/15/2013   K 3.7 09/01/2018   K 4.3  03/15/2013   CL 107 09/01/2018   CL 97 (L) 03/15/2013   CO2 24 09/01/2018   CO2 30 03/15/2013   BUN 18 09/01/2018   BUN 16 02/10/2018   BUN 21 (H) 03/15/2013   CREATININE 1.53 (H) 09/06/2018   CREATININE 1.30 03/15/2013   PROT 6.6 09/01/2018   PROT 6.7 02/10/2018   PROT 7.9 03/15/2013   ALBUMIN 3.7 09/01/2018   ALBUMIN 4.3 02/10/2018   ALBUMIN 4.0 03/15/2013   BILITOT 0.6 09/01/2018   BILITOT 0.2 02/10/2018   BILITOT 0.2 03/15/2013   ALKPHOS 145 (H) 09/01/2018   ALKPHOS 131 03/15/2013   AST 24 09/01/2018   AST 11 (L) 03/15/2013   ALT 64 (H) 09/01/2018   ALT 31 03/15/2013  .   Total Time in preparing paper work, data evaluation and todays exam - 35 minutes  Katha Hamming M.D on 09/06/2018 at 9:17 AM    Note: This dictation was prepared with Dragon dictation along with smaller phrase technology. Any transcriptional errors that result from this process are unintentional.

## 2018-09-06 NOTE — Care Management Note (Signed)
Case Management Note  Patient Details  Name: Evan MONCRIEFF Sr. MRN: [id number] Date of Birth: 07-27-1956  Subjective/Objective:     Tresa Endo with [facility] called and notified RNCM that patient has been accepted and can transfer today.  Accepting physician is Harold Hedge and the room number is [phone].  Patients nurse Darl Pikes, RN and Dr. Luberta Mutter aware.  Wife Meriam Sprague needs to be notified when [facility] will be here so she can meet patient at [facility].                Action/Plan:   Expected Discharge Date:  09/06/18               Expected Discharge Plan:  IP Rehab Facility  In-House Referral:     Discharge planning Services  CM Consult  Post Acute Care Choice:    Choice offered to:     DME Arranged:    DME Agency:     HH Arranged:    HH Agency:     Status of Service:  Completed, signed off  If discussed at [employer], dates discussed:    Additional Comments:  Sherren Kerns, RN 09/06/2018, 9:31 AM

## 2018-09-06 NOTE — Progress Notes (Signed)
Meredith Staggers, MD  Physician  Physical Medicine and Rehabilitation  PMR Pre-admission  Signed  Date of Service:  09/03/2018 3:18 PM       Related encounter: ED to Hosp-Admission (Discharged) from 09/01/2018 in [place] (2A)      Signed         [employer] PMR Admission Coordinator Pre-Admission Assessment  Patient: Evan Townsend. is an 63 y.o., male MRN: [id number] DOB: April 11, 1956 Height: 5' 6"  (167.6 cm) Weight: 68 kg  Insurance Information HMO:    PPO:     PCP:      IPA:      80/20:      OTHER:  PRIMARY: Medicaid [place]      Policy#: [id number]      Subscriber: Patient COVERAGE CODE: [place] Name:       Phone#:      Fax#:  Pre-Cert#:       Employer:  Benefits:  Phone #: [phone]     Name: via Automated system, verified eligibility on 09/02/18 Eff. Date: verified on 09/02/18     Deduct: NA       Out of Pocket Max: NA     Life Max: NA CIR: Covered per Medicaid guidelines      SNF:  Outpatient:      Co-Pay:  Home Health:       Co-Pay:  DME:      Co-Pay:  Providers:  SECONDARY:       Policy#:       Subscriber:  CM Name:       Phone#:      Fax#:  Pre-Cert#:       Employer:  Benefits:  Phone #:      Name:  Eff. Date:      Deduct:       Out of Pocket Max:       Life Max:  CIR:       SNF: Outpatient:      Co-Pay:  Home Health:       Co-Pay:  DME:      Co-Pay:   Medicaid Application Date:       Case Manager:  Disability Application Date:       Case Worker:   Emergency Contact Information         Contact Information    Name Relation Home Work Mobile   Evan Townsend,Evan Townsend Spouse [phone]        Current Medical History  Patient Admitting Diagnosis: Acute right basal ganglia CVA  History of Present Illness: Pt is a 63 y.o. male with history of DM, hyperlipidemia, HTN, prior stroke, and tobacco use. Pt presented to ED with left facial droop, left sided weakness and numbness, and dysarthia. Pt was noted to  have difficulty walking without assistance. Pt had elevated BP upon arrival to ED at 192/93. Initial CT was negative for acute intracranial abnormality. Pt was admitted for further work up. 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  Pt was seen by OT, PT, and SLP with recommendations for CIR. On 1/19, PT noted increased weakness on affected side with increased assist required for transfers. Stat CT ordered with no new focal infarct, hemorrhage, or space-occupying mass lesion seen. Another repeat head CT was performed on 1/20 due to worsening weakness on the left, again with no acute findings. Pt is to be admitted to CIR on 09/06/2018.   Patient's medical  record from [facility] has been reviewed by the rehabilitation admission coordinator and physician.   Past Medical History      Past Medical History:  Diagnosis Date  . Diabetes 1.5, managed as type 2 ([place])   . Diabetes mellitus without complication ([place])   . Hypercholesterolemia   . Hypertension   . Pain in both feet   . Stroke [facility])     Family History   family history includes Diabetes in his father; Hypertension in his father and mother.  Prior Rehab/Hospitalizations Has the patient had major surgery during 100 days prior to admission? No               Current Medications  Current Facility-Administered Medications:  .  acetaminophen (TYLENOL) tablet 650 mg, 650 mg, Oral, Q4H PRN **OR** acetaminophen (TYLENOL) solution 650 mg, 650 mg, Per Tube, Q4H PRN **OR** acetaminophen (TYLENOL) suppository 650 mg, 650 mg, Rectal, Q4H PRN, Gouru, Aruna, MD .  aspirin EC tablet 81 mg, 81 mg, Oral, Daily, Gouru, Aruna, MD, 81 mg at 09/05/18 0953 .  atorvastatin (LIPITOR) tablet 80 mg, 80 mg, Oral, QPM, Arnaldo Natal, MD, 80 mg at 09/05/18 1757 .  clopidogrel (PLAVIX) tablet 75 mg, 75 mg, Oral, Daily, Gouru, Aruna, MD, 75 mg at 09/05/18 0953 .   enoxaparin (LOVENOX) injection 40 mg, 40 mg, Subcutaneous, Q24H, Gouru, Aruna, MD, 40 mg at 09/05/18 1251 .  gabapentin (NEURONTIN) capsule 600 mg, 600 mg, Oral, TID, Gouru, Aruna, MD, 600 mg at 09/05/18 2038 .  glipiZIDE (GLUCOTROL) tablet 10 mg, 10 mg, Oral, QAC breakfast, Gouru, Aruna, MD, 10 mg at 09/05/18 0829 .  hydrALAZINE (APRESOLINE) injection 10 mg, 10 mg, Intravenous, Q6H PRN, Vaughan Basta, MD, 10 mg at 09/02/18 1836 .  insulin aspart (novoLOG) injection 0-15 Units, 0-15 Units, Subcutaneous, TID WC, Gouru, Aruna, MD, 3 Units at 09/04/18 1734 .  insulin aspart (novoLOG) injection 0-5 Units, 0-5 Units, Subcutaneous, QHS, Gouru, Aruna, MD, 2 Units at 09/03/18 2143 .  insulin detemir (LEVEMIR) injection 12 Units, 12 Units, Subcutaneous, Daily, Epifanio Lesches, MD, 12 Units at 09/05/18 2124 .  lisinopril (PRINIVIL,ZESTRIL) tablet 40 mg, 40 mg, Oral, Daily, Gouru, Aruna, MD, 40 mg at 09/05/18 0952 .  nicotine (NICODERM CQ - dosed in mg/24 hours) patch 14 mg, 14 mg, Transdermal, Daily, Gouru, Aruna, MD, 14 mg at 09/05/18 0953 .  polyvinyl alcohol (LIQUIFILM TEARS) 1.4 % ophthalmic solution 1 drop, 1 drop, Right Eye, PRN, Epifanio Lesches, MD, 1 drop at 09/03/18 1133 .  sodium chloride flush (NS) 0.9 % injection 10 mL, 10 mL, Intravenous, Q12H, Epifanio Lesches, MD, 10 mL at 09/05/18 2041 .  traMADol (ULTRAM) tablet 50 mg, 50 mg, Oral, Q6H PRN, Gouru, Aruna, MD  Patients Current Diet:      Diet Order                  DIET DYS 3 Room service appropriate? Yes with Assist; Fluid consistency: Nectar Thick  Diet effective now               Precautions / Restrictions Precautions Precautions: Fall Restrictions Weight Bearing Restrictions: No   Has the patient had 2 or more falls or a fall with injury in the past year?No  Prior Activity Level Community (5-7x/wk): Per pt, he was very active PTA, working full time, driving and getting out of the house  daily  Prior Functional Level Do you want Prior Function Level of Independence: Independent Comments: Pt reports he  is on diability for previous stroke but reports not residual deficits.  Pt ind wth ADLs and ambualtes without AD with no falls in the past 6 months.  Does not drive.   from other? Self Care: Did the patient need help bathing, dressing, using the toilet or eating?  Independent  Indoor Mobility: Did the patient need assistance with walking from room to room (with or without device)? Independent  Stairs: Did the patient need assistance with internal or external stairs (with or without device)? Independent  Functional Cognition: Did the patient need help planning regular tasks such as shopping or remembering to take medications? Independent  Home Assistive Devices / Equipment Home Assistive Devices/Equipment: None Home Equipment: None  Prior Device Use: Indicate devices/aids used by the patient prior to current illness, exacerbation or injury? None of the above  Prior Functional Level Comments: Pt reports he is on diability for previous stroke but reports not residual deficits.  Pt ind wth ADLs and ambualtes without AD with no falls in the past 6 months.  Does not drive.     Prior Functional Level Current Functional Level  Bed Mobility Independent  Mod A  Transfers Independent  Mod A  Mobility - Walk/Wheelchair Independent On 1/18: Min/Mod A x 2 for 30 feet; on 1/19: unable/deferred; on 1/20: Mod A for 4 feet  Mobility - Ambulation/Gait Independent On 1/18: Min/Mod A x 2 for 30 feet; on 1/19: unable/deferred; on 1/20: Mod A for 4 feet  Upper Body Dressing Independent Min A on 1/18; NT on 1/20  Lower Body Dressing Independent Max A on 1/18; NT on 1/20  Grooming Independent Min A on 1/18; NT on 1/20  Eating/Drinking Independent Min A on 1/18; NT on 1/20  Toilet Transfer Independent  NT on 1/18; Max A x2 on 1/20  Bladder Continence Continent, Independent Continent,  toileting functional level: Max A x2  Bowel Management  Continent, Independent Continent, last BM 09/04/18  Stair Climbing Independent Not tested  Communication WNL Moderate dysarthria with imprecise articulation  Memory WNL Deficits in memory according to breakdown of MOCA score of 14/30.   Cooking/Meal Prep  Independent      Housework  Independent   Money Management  Independent   Driving  Independent (inconsistency from PT evaluation stating pt does not drive).      Special needs/care consideration BiPAP/CPAP: no CPM: no Continuous Drip IV: no Dialysis: No        Days: NA Life Vest: No Oxygen: no, RA Special Bed: no Trach Size: no Wound Vac (area): no    Location: no Skin: no areas of concern           Bowel mgmt:continenet, last BM 09/04/18 Bladder mgmt:continent Diabetic mgmt: yes  Previous Home Environment Living Arrangements: Spouse/significant other Available Help at Discharge: Family, Available PRN/intermittently(wife works during the day) Type of Home: Mobile home Home Layout: One level Home Access: Stairs to enter Entrance Stairs-Rails: Left Entrance Stairs-Number of Steps: 5 Bathroom Shower/Tub: Optometrist: Yes Home Care Services: No  Discharge Living Setting Plans for Discharge Living Setting: Patient's home, House, Lives with (comment)(Wife) Type of Home at Discharge: House Discharge Home Layout: One level Discharge Home Access: Stairs to enter Entrance Stairs-Rails: Left Entrance Stairs-Number of Steps: 4 steps Discharge Bathroom Shower/Tub: Tub/shower unit Discharge Bathroom Toilet: Standard Discharge Bathroom Accessibility: Yes How Accessible: Accessible via walker Does the patient have any problems obtaining your medications?: Yes (Describe)  Social/Family/Support Systems Patient Roles: Spouse Contact  Information: wife Rise Paganini); [phone] Anticipated Caregiver: wife and  extended family (grown children and grandchildren all live nearby)  Anticipated Caregiver's Contact Information: see above Ability/Limitations of Caregiver: Min A Caregiver Availability: 24/7, wife works approx 2-3hrs/day with plan for family to stay with him during that time.  Discharge Plan Discussed with Primary Caregiver: Yes Is Caregiver In Agreement with Plan?: Yes Does Caregiver/Family have Issues with Lodging/Transportation while Pt is in Rehab?: No  Goals/Additional Needs Patient/Family Goal for Rehab: PT/OT/SLP: Supervision  Expected length of stay: 7-10 days Cultural Considerations: NA Dietary Needs: DYS 3, nectar thick Equipment Needs: TBD Pt/Family Agrees to Admission and willing to participate: Yes Program Orientation Provided & Reviewed with Pt/Caregiver Including Roles  & Responsibilities: Yes(pt and wife)  Barriers to Discharge: Home environment access/layout, Lack of/limited family support  Barriers to Discharge Comments: steps to enter home; tub shower; wife works  Patient Condition: I have reviewed medical records from [facility], spoken with CM, RN, and the patient and his spouse. I met with patient at the bedside and discussed via phone details regarding CIR program and expectations and gathered all pertinent information needed for inpatient rehabilitation assessment.  Patient will benefit from ongoing PT, OT, and SLP, can actively participate in 3 hours of therapy a day 5 days of the week, and can make measurable gains during the admission.  Patient will also benefit from the coordinated team approach during an Inpatient Acute Rehabilitation admission.  The patient will receive intensive therapy as well as Rehabilitation physician, nursing, social worker, and care management interventions.  Due to bowel management, bladder management, safety, skin/wound care, disease management, medical administration, pain management, patient education the patient  requires 24 hour a day rehabilitation nursing.  The patient is currently fluctuating between Mod A and Mod A x2 for transfers and Min A for basic ADLs (Max A for LB ADLs).  Discharge setting and therapy post discharge at home with home health is anticipated.  Patient has agreed to participate in the Acute Inpatient Rehabilitation Program and will admit today, 09/06/18.  Preadmission Screen Completed By:  Jhonnie Garner, 09/06/2018 9:05 AM ______________________________________________________________________   Discussed status with Dr. Naaman Plummer on 09/06/18 at 8:42AM and received telephone approval for admission today.  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 Does the need for close, 24 hr/day  Medical supervision in concert with the patient's rehab needs make it unreasonable for this patient to be served in a less intensive setting? Yes 2. Co-Morbidities requiring supervision/potential complications: DM, HTN, previous stroke 3. Due to bladder management, bowel management, safety, skin/wound care, disease management, medication administration, pain management and patient education, does the patient require 24 hr/day rehab nursing? Yes 4. Does the patient require coordinated care of a physician, rehab nurse, PT (1-2 hrs/day, 5 days/week), OT (1-2 hrs/day, 5 days/week) and SLP (1-2 hrs/day, 5 days/week) to address physical and functional deficits in the context of the above medical diagnosis(es)? Yes Addressing deficits in the following areas: balance, endurance, locomotion, strength, transferring, bowel/bladder control, bathing, dressing, feeding, grooming, toileting, cognition, speech, swallowing and psychosocial support 5. Can the patient actively participate in an intensive therapy program of at least 3 hrs of therapy 5 days a week? Yes 6. The potential for patient to make measurable gains while on  inpatient rehab is excellent 7. Anticipated functional outcomes upon discharge from inpatients are: supervision PT, supervision OT, supervision SLP 8. Estimated rehab length of  stay to reach the above functional goals is: 7-11 days 9. Anticipated Townsend/C setting: Home 10. Anticipated post Townsend/C treatments: [facility] therapy 11. Overall Rehab/Functional Prognosis: excellent    RECOMMENDATIONS: This patient's condition is appropriate for continued rehabilitative care in the following setting: CIR Patient has agreed to participate in recommended program. Yes Note that insurance prior authorization may be required for reimbursement for recommended care.  Comment: Admit to inpatient rehab today  Meredith Staggers, MD, [place] [facility] Rehabilitation 09/06/2018   Jhonnie Garner 09/06/2018        Revision History    Date/Time User Provider Type Action  09/06/2018 9:16 AM Meredith Staggers, MD Physician Sign  09/06/2018 9:06 AM Jhonnie Garner, OT Rehab Admission Coordinator Share  View Details Report

## 2018-09-06 NOTE — Clinical Social Work Note (Signed)
Patient has been accepted into CIR, CSW signing off.  Ervin Knack. Unique Sillas, MSW, [employer] [phone]

## 2018-09-07 ENCOUNTER — Inpatient Hospital Stay (HOSPITAL_COMMUNITY): Payer: Medicaid Other | Admitting: Speech Pathology

## 2018-09-07 ENCOUNTER — Inpatient Hospital Stay (HOSPITAL_COMMUNITY): Payer: Medicaid Other | Admitting: Occupational Therapy

## 2018-09-07 ENCOUNTER — Inpatient Hospital Stay (HOSPITAL_COMMUNITY): Payer: Medicaid Other | Admitting: Physical Therapy

## 2018-09-07 DIAGNOSIS — I639 Cerebral infarction, unspecified: Secondary | ICD-10-CM

## 2018-09-07 DIAGNOSIS — G8114 Spastic hemiplegia affecting left nondominant side: Secondary | ICD-10-CM

## 2018-09-07 DIAGNOSIS — I69322 Dysarthria following cerebral infarction: Secondary | ICD-10-CM

## 2018-09-07 LAB — COMPREHENSIVE METABOLIC PANEL
ALT: 107 U/L — ABNORMAL HIGH (ref 0–44)
AST: 58 U/L — ABNORMAL HIGH (ref 15–41)
Albumin: 3.6 g/dL (ref 3.5–5.0)
Alkaline Phosphatase: 120 U/L (ref 38–126)
Anion gap: 7 (ref 5–15)
BUN: 37 mg/dL — ABNORMAL HIGH (ref 8–23)
CO2: 21 mmol/L — ABNORMAL LOW (ref 22–32)
Calcium: 9 mg/dL (ref 8.9–10.3)
Chloride: 112 mmol/L — ABNORMAL HIGH (ref 98–111)
Creatinine, Ser: 1.39 mg/dL — ABNORMAL HIGH (ref 0.61–1.24)
GFR calc Af Amer: >60 mL/min (ref >60)
GFR calc non Af Amer: 54 mL/min — ABNORMAL LOW (ref >60)
Glucose, Bld: 161 mg/dL — ABNORMAL HIGH (ref 70–99)
Potassium: 4.4 mmol/L (ref 3.5–5.1)
Sodium: 140 mmol/L (ref 135–145)
Total Bilirubin: 0.3 mg/dL (ref 0.3–1.2)
Total Protein: 6.8 g/dL (ref 6.5–8.1)

## 2018-09-07 LAB — GLUCOSE, CAPILLARY
Glucose-Capillary: 154 mg/dL — ABNORMAL HIGH (ref 70–99)
Glucose-Capillary: 140 mg/dL — ABNORMAL HIGH (ref 70–99)
Glucose-Capillary: 193 mg/dL — ABNORMAL HIGH (ref 70–99)
Glucose-Capillary: 130 mg/dL — ABNORMAL HIGH (ref 70–99)

## 2018-09-07 LAB — CBC WITH DIFFERENTIAL/PLATELET
Abs Immature Granulocytes: 0.01 10*3/uL (ref 0.00–0.07)
Basophils Absolute: 0 10*3/uL (ref 0.0–0.1)
Basophils Relative: 0 %
Eosinophils Absolute: 0.1 10*3/uL (ref 0.0–0.5)
Eosinophils Relative: 3 %
HCT: 41 % (ref 39.0–52.0)
Hemoglobin: 13.1 g/dL (ref 13.0–17.0)
Immature Granulocytes: 0 %
Lymphocytes Relative: 41 %
Lymphs Abs: 1.9 10*3/uL (ref 0.7–4.0)
MCH: 28.7 pg (ref 26.0–34.0)
MCHC: 32 g/dL (ref 30.0–36.0)
MCV: 89.7 fL (ref 80.0–100.0)
Monocytes Absolute: 0.4 10*3/uL (ref 0.1–1.0)
Monocytes Relative: 10 %
Neutro Abs: 2.2 10*3/uL (ref 1.7–7.7)
Neutrophils Relative %: 46 %
Platelets: 237 10*3/uL (ref 150–400)
RBC: 4.57 MIL/uL (ref 4.22–5.81)
RDW: 14 % (ref 11.5–15.5)
WBC: 4.6 10*3/uL (ref 4.0–10.5)
nRBC: 0 % (ref 0.0–0.2)

## 2018-09-07 NOTE — Progress Notes (Signed)
[place] PHYSICAL MEDICINE & REHABILITATION PROGRESS NOTE   Subjective/Complaints:  Did well with PT this am, good activation of Left quad Pt c/o drooling  ROS- neg CP, SOB, N/V/D  Objective:   Ct Head Wo Contrast  Result Date: 09/05/2018 CLINICAL DATA:  Followup right basal ganglia infarcts. EXAM: CT HEAD WITHOUT CONTRAST TECHNIQUE: Contiguous axial images were obtained from the base of the skull through the vertex without intravenous contrast. COMPARISON:  Head CT 09/04/2018 and MRI brain 09/01/2018. FINDINGS: Brain: Stable CT appearance of the small lacunar-type infarcts noted in the posterior limb of the right internal capsule. No complicating features such as hemorrhage are identified. No new/acute intracranial findings and no extra-axial fluid collections. Ventricles remain in the midline without mass effect or shift and are normal in size and configuration. The gray-white differentiation is maintained. The brainstem and cerebellum are unremarkable. Vascular: No significant findings. Skull: No significant findings. Sinuses/Orbits: The paranasal sinuses and mastoid air cells are clear. The globes are intact. Other: None. IMPRESSION: 1. Stable CT appearance of small lacunar type infarcts in the posterior limb of the internal capsule on the right side. No complicating features. 2. No new/acute intracranial findings since yesterday's head CT. Electronically Signed   By: Rudie Meyer M.D.   On: 09/05/2018 12:29   Recent Labs    09/06/18 1542 09/07/18 0641  WBC 5.7 4.6  HGB 12.8* 13.1  HCT 40.0 41.0  PLT 254 237   Recent Labs    09/06/18 1542 09/07/18 0641  NA  --  140  K  --  4.4  CL  --  112*  CO2  --  21*  GLUCOSE  --  161*  BUN  --  37*  CREATININE 1.68* 1.39*  CALCIUM  --  9.0    Intake/Output Summary (Last 24 hours) at 09/07/2018 0926 Last data filed at 09/07/2018 0127 Gross per 24 hour  Intake 354 ml  Output 450 ml  Net -96 ml     Physical Exam: Vital  Signs Blood pressure 129/64, pulse (!) 41, temperature 97.6 F (36.4 C), temperature source Oral, resp. rate 17, height 5\' 6"  (1.676 m), weight 68.1 kg, SpO2 100 %.   General: No acute distress Mood and affect are appropriate Heart: bradycardia , normal rhythm no rubs murmurs or extra sounds Lungs: Clear to auscultation, breathing unlabored, no rales or wheezes Abdomen: Positive bowel sounds, soft nontender to palpation, nondistended Extremities: No clubbing, cyanosis, or edema Skin: No evidence of breakdown, no evidence of rash Neurologic: Cranial nerves II through XII intact, motor strength is 5/5 in RIght deltoid, bicep, tricep, grip, hip flexor, knee extensors, ankle dorsiflexor and plantar flexor, 0/5 LUE, 3- LLE Quad and hip add, 0/5 Left ankle DF/PF Sensory exam normal sensation to light touch and proprioception in bilateral upper and lower extremities Cerebellar exam normal unable to do finger to nose to finger left upper     Musculoskeletal: Full range of motion in all 4 extremities. No joint swelling   Assessment/Plan: 1. 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  Physiatrist is providing close team supervision and 24 hour management of active medical problems listed below.  Physiatrist and rehab team continue to assess barriers to discharge/monitor patient progress toward functional and medical goals  Care Tool:  Bathing              Bathing assist  Upper Body Dressing/Undressing Upper body dressing        Upper body assist      Lower Body Dressing/Undressing Lower body dressing            Lower body assist       Toileting Toileting    Toileting assist       Transfers Chair/bed transfer  Transfers assist           Locomotion Ambulation   Ambulation assist              Walk 10 feet  activity   Assist           Walk 50 feet activity   Assist           Walk 150 feet activity   Assist           Walk 10 feet on uneven surface  activity   Assist           Wheelchair     Assist               Wheelchair 50 feet with 2 turns activity    Assist            Wheelchair 150 feet activity     Assist          Medical Problem List and Plan: 1.   Left side weakness with dysarthria/dysphagia secondary to lacunar infarction in the posterior limb right internal capsule             -CIR PT, OT,  SLP evals today 2.  DVT Prophylaxis/Anticoagulation: subcutaneous Lovenox. Monitor for any bleeding episodes 3. Pain Management: Neurontin 600 mg 3 times a day,tramadol as needed             -support while bed/chair to reduce sublux 4. Mood:  Provide emotional support 5. Neuropsych: This patient is capable of making decisions on his own behalf. 6. Skin/Wound Care:  Routine skin checks 7. Fluids/Electrolytes/Nutrition:  Routine in and out's with follow-up chemistries 8. Dysphagia. Dysphagia #3 nectar thick liquids. Follow-up speech therapy, advance as tolerated 9. Diabetes mellitus with peripheral neuropathy. Hemoglobin A1c 10.3. Glucotrol 10 mg daily, Levemir 12 units daily. Check blood sugars before meals and at bedtime. Diabetic teaching CBG (last 3)  Recent Labs    09/06/18 1625 09/06/18 2104 09/07/18 0613  GLUCAP 202* 156* 154*  fair control 10. Hypertension. Lisinopril 40 mg daily. Vitals:   09/06/18 2010 09/07/18 0524  BP: (!) 118/57 129/64  Pulse: (!) 53 (!) 41  Resp: 16 17  Temp: 97.8 F (36.6 C) 97.6 F (36.4 C)  SpO2: 100% 100%  controlled ,bradycardia EKG 1/16 sinus  Brady , asymptomatic 11. Hyperlipidemia. Lipitor 12. Tobacco as well as polysubstance abuse. NicoDerm patch. Provide counseling     LOS: 1 days A FACE TO FACE EVALUATION WAS PERFORMED  Erick Colacendrew E Tandrea Kommer 09/07/2018, 9:26 AM

## 2018-09-07 NOTE — Evaluation (Signed)
Occupational Therapy Assessment and Plan  Patient Details  Name: Evan CAMMACK Sr. MRN: [id number] Date of Birth: [birth year]  OT Diagnosis: abnormal posture, cognitive deficits, hemiplegia affecting non-dominant side, muscle weakness (generalized) and coordination disorder Rehab Potential: Rehab Potential (ACUTE ONLY): Good ELOS: 2 weeks   Today's Date: 09/07/2018 OT Individual Time: 3785-8850 OT Individual Time Calculation (min): 73 min     Problem List:  Patient Active Problem List   Diagnosis Date Noted  . Lacunar infarct, acute ([place]) 09/06/2018  . TIA (transient ischemic attack) 09/01/2018  . Left leg pain 01/13/2018  . CVA (cerebral vascular accident) ([place]) 09/10/2017  . Hyperlipidemia 02/11/2017  . Acute CVA (cerebrovascular accident) ([place]) 11/10/2016  . Left sided numbness 11/10/2016  . Slurred speech 11/10/2016  . Essential hypertension 11/10/2016  . Diabetes ([place]) 11/10/2016    Past Medical History:  Past Medical History:  Diagnosis Date  . Diabetes 1.5, managed as type 2 ([place])   . Diabetes mellitus without complication ([place])   . Hypercholesterolemia   . Hypertension   . Pain in both feet   . Stroke [facility])    Past Surgical History: History reviewed. No pertinent surgical history.  Assessment & Plan Clinical Impression: Patient is a 63 y.o. year old male with history of CVA in the past with no residual deficits, diabetes mellitus, hypertension, hyperlipidemia, tobacco abuse. Presented 09/01/2020 [facility] with left facial droop and left-sided weakness as well as slurred speech. Per chart review patient lives with spouse. One level home with 5 steps to entry. Independent prior to admission. Wife works during the day. He does not drive. Cranial CT scan negative. Urine drug screen positive for cocaine. Patient did not receive TPA. Carotid Dopplers in no ICA stenosis. MRI/MRA showed positive for acute lacunar infarction of the posterior limb right  internal capsule. No associated hemorrhage. MRA with no significant stenosis. Echocardiogram with ejection fraction of 27% grade 1 diastolic dysfunction no regional wall motion abnormalities. Maintain on aspirin and Plavix for CVA prophylaxis. Subcutaneous Lovenox for DVT prophylaxis. Follow-up speech therapy for dysphagia mechanical soft nectar thick liquids. Therapy evaluations completed and patient was admitted for a comprehensive rehabilitation program.  Patient transferred to CIR on 09/06/2018 .    Patient currently requires max with basic self-care skills secondary to muscle weakness, decreased cardiorespiratoy endurance, decreased coordination and decreased motor planning, decreased problem solving, decreased safety awareness and delayed processing and decreased sitting balance, decreased standing balance, decreased postural control, hemiplegia and decreased balance strategies.  Prior to hospitalization, patient could complete ADLs and IADLs with independent .  Patient will benefit from skilled intervention to decrease level of assist with basic self-care skills prior to discharge home with care partner.  Anticipate patient will require 24 hour supervision and minimal physical assistance and follow up home health.  OT - End of Session Activity Tolerance: Decreased this session Endurance Deficit: Yes Endurance Deficit Description: decreased OT Assessment Rehab Potential (ACUTE ONLY): Good OT Barriers to Discharge: Other (comments) OT Barriers to Discharge Comments: none known at this time OT Patient demonstrates impairments in the following area(s): Balance;Cognition;Endurance;Motor;Pain;Perception;Safety OT Basic ADL's Functional Problem(s): Grooming;Bathing;Dressing;Toileting OT Transfers Functional Problem(s): Toilet;Tub/Shower OT Additional Impairment(s): Fuctional Use of Upper Extremity OT Plan OT Intensity: Minimum of 1-2 x/day, 45 to 90 minutes OT Frequency: 5 out of 7 days OT  Duration/Estimated Length of Stay: 2 weeks OT Treatment/Interventions: Balance/vestibular training;Neuromuscular re-education;Self Care/advanced ADL retraining;Therapeutic Exercise;Wheelchair propulsion/positioning;Cognitive remediation/compensation;DME/adaptive equipment instruction;Pain management;UE/LE Strength taining/ROM;Community reintegration;Functional electrical stimulation;Patient/family education;Splinting/orthotics;UE/LE Coordination activities;Discharge planning;Functional  mobility training;Psychosocial support;Therapeutic Activities OT Self Feeding Anticipated Outcome(s): set up OT Basic Self-Care Anticipated Outcome(s): S - min A OT Toileting Anticipated Outcome(s): min A OT Bathroom Transfers Anticipated Outcome(s): min A OT Recommendation Recommendations for Other Services: Neuropsych consult Patient destination: Home Follow Up Recommendations: 24 hour supervision/assistance;Home health OT Equipment Recommended: To be determined   Skilled Therapeutic Intervention Upon entering the room, pt sleeping soundly in bed but easily awoken for OT evaluation. Pt very motivated for shower this session. OT educated pt on OT purpose, POC, and goals with pt verbalizing understanding and agreement. Pt performed stand pivot transfer from bed >wheelchair and OT assisted pt into bathroom. Pt very impulsive and give mod cuing throughout session to follow commands and slow down. Pt seated on TTB with mod A and use of grab bar. Pt reaching laterally to remove sock and requiring max A for sitting balance. Pt seated with min guard while bathing and hand over hand assistance to utilize L UE in functional tasks. Pt returning to wheelchair in same manner as above. He engaged in dressing from wheelchair with sit <>stand at sink. L knee blocked when standing with mod A for standing balance. Focus on hemiplegic technique with therapist having to remove clothing and give instructions first for pt to attend to  directions. Pt returning to bed at end of session secondary to fatigue. Pt positioned to protect hemiplegic side. Call bell and all needed items within reach and bed alarm activated.   OT Evaluation Precautions/Restrictions  Precautions Precautions: Fall General   Vital Signs Therapy Vitals Temp: 97.7 F (36.5 C) Pulse Rate: 62 Resp: 18 BP: 140/69 Patient Position (if appropriate): Lying Oxygen Therapy SpO2: 100 % O2 Device: Room Air Pain Pain Assessment Pain Scale: 0-10 Pain Score: 0-No pain Home Living/Prior Functioning Home Living Family/patient expects to be discharged to:: Private residence Living Arrangements: Spouse/significant other Available Help at Discharge: Family, Available 24 hours/day Type of Home: Mobile home Home Access: Stairs to enter, Ramped entrance Technical brewer of Steps: 5 Entrance Stairs-Rails: Left Home Layout: One level Bathroom Shower/Tub: Optometrist: Yes  Lives With: Spouse Prior Function Level of Independence: Independent with basic ADLs, Independent with gait, Independent with homemaking with ambulation, Independent with transfers  Able to Take Stairs?: Yes Driving: Yes Vocation: Full time employment Vocation Requirements: works on Architect sites Comments: on disability Vision Patient Visual Report: Blurring of vision;Other (comment)(symptom prior to stroke per pt report) Vision Assessment?: Yes Ocular Range of Motion: Impaired-to be further tested in functional context Alignment/Gaze Preference: Gaze right Cognition Overall Cognitive Status: Impaired/Different from baseline Arousal/Alertness: Awake/alert Orientation Level: Person;Place;Situation Person: Oriented Place: Oriented Situation: Oriented Year: 2020 Month: January Day of Week: Correct Memory: Appears intact Immediate Memory Recall: Sock;Blue;Bed Memory Recall: Sock;Blue;Bed Memory Recall Sock:  Without Cue Memory Recall Blue: Without Cue Memory Recall Bed: Without Cue Attention: Alternating;Divided;Selective;Sustained Sustained Attention: Appears intact Awareness: Impaired Awareness Impairment: Emergent impairment Behaviors: Impulsive Safety/Judgment: Impaired Comments: Mild impulsivity, decreased awareness of deficits Sensation Sensation Light Touch: Appears Intact Coordination Gross Motor Movements are Fluid and Coordinated: No Fine Motor Movements are Fluid and Coordinated: No Motor  Motor Motor: Hemiplegia Motor - Skilled Clinical Observations: L hemi, UE>LE Mobility  Bed Mobility Bed Mobility: Rolling Right;Rolling Left;Supine to Sit;Sit to Supine Rolling Right: Minimal Assistance - Patient > 75% Rolling Left: Minimal Assistance - Patient > 75% Supine to Sit: Moderate Assistance - Patient 50-74% Sit to Supine: Moderate Assistance - Patient 50-74% Transfers Sit to  Stand: Minimal Assistance - Patient > 75% Stand to Sit: Minimal Assistance - Patient > 75%  Trunk/Postural Assessment  Cervical Assessment Cervical Assessment: Within Functional Limits Thoracic Assessment Thoracic Assessment: Within Functional Limits Lumbar Assessment Lumbar Assessment: Within Functional Limits Postural Control Postural Control: Deficits on evaluation  Balance Balance Balance Assessed: Yes Static Sitting Balance Static Sitting - Balance Support: No upper extremity supported;Feet supported Static Sitting - Level of Assistance: 5: Stand by assistance Dynamic Sitting Balance Dynamic Sitting - Balance Support: No upper extremity supported;Feet supported Dynamic Sitting - Level of Assistance: 4: Min Insurance risk surveyor Standing - Balance Support: Right upper extremity supported Static Standing - Level of Assistance: 4: Min assist Dynamic Standing Balance Dynamic Standing - Balance Support: Right upper extremity supported;During functional activity Dynamic  Standing - Level of Assistance: 3: Mod assist Extremity/Trunk Assessment RUE Assessment RUE Assessment: Within Functional Limits LUE Assessment LUE Assessment: Exceptions to [facility] Passive Range of Motion (PROM) Comments: WFLs General Strength Comments: 0/5     Refer to Care Plan for Long Term Goals  Recommendations for other services: Neuropsych   Discharge Criteria: Patient will be discharged from OT if patient refuses treatment 3 consecutive times without medical reason, if treatment goals not met, if there is a change in medical status, if patient makes no progress towards goals or if patient is discharged from hospital.  The above assessment, treatment plan, treatment alternatives and goals were discussed and mutually agreed upon: by patient  Gypsy Decant 09/07/2018, 4:41 PM

## 2018-09-07 NOTE — Patient Care Conference (Signed)
Inpatient RehabilitationTeam Conference and Plan of Care Update Date: 09/07/2018   Time: 10:58 AM    Patient Name: Evan AnoDuncan E Malanowski Sr.      Medical Record Number: [id number]  Date of Birth: [birth year] Sex: Male         Room/Bed: 4W21C/4W21C-01 Payor Info: Payor: MEDICAID [place] / Plan: MEDICAID [place] ACCESS / Product Type: *No Product type* /    Admitting Diagnosis: cva  Admit Date/Time:  09/06/2018  1:08 PM Admission Comments: No comment available   Primary Diagnosis:  <principal problem not specified> Principal Problem: <principal problem not specified>  Patient Active Problem List   Diagnosis Date Noted  . Lacunar infarct, acute ([facility]) 09/06/2018  . TIA (transient ischemic attack) 09/01/2018  . Left leg pain 01/13/2018  . CVA (cerebral vascular accident) ([facility]) 09/10/2017  . Hyperlipidemia 02/11/2017  . Acute CVA (cerebrovascular accident) ([facility]) 11/10/2016  . Left sided numbness 11/10/2016  . Slurred speech 11/10/2016  . Essential hypertension 11/10/2016  . Diabetes ([facility]) 11/10/2016    Expected Discharge Date: Expected Discharge Date: 10/03/18  Team Members Present: Physician leading conference: Dr. Claudette LawsAndrew Kirsteins Social Worker Present: Dossie DerBecky Camrynn Mcclintic, LCSW Nurse Present: Mason JimJoanna Kooy, RN PT Present: Carlynn PurlAmy Tally, PT OT Present: Roney MansJennifer Smith, OT;Ardis Rowanom Lanier, COTA SLP Present: Feliberto Gottronourtney Payne, SLP     Current Status/Progress Goal Weekly Team Focus  Medical   Pt wiht HTN, DM needing increased control  reduced stroke risk, reduce fall risk  initiate rehab program   Bowel/Bladder   Mostly continent of Bowel and bladder. per report periods of bowel incontinence.   fewer episodes of incontinence  assess toileting needs qshift and PRN   Swallow/Nutrition/ Hydration   Dys. 3 textures with nectar-thick liquids, water protocol, Supervision  Mod I  upgraded trials, repeat MBS soon    ADL's     eval pending        Mobility   min-mod assist transfers, mod assist gait at rail 30'   supervision w/c, CGA gait  LLE NMR, all functional mobility, gait training    Communication   Mod-Max A  Supervision  introduce and utilize speech intelligibility strategies    Safety/Cognition/ Behavioral Observations  Min A  Supervision-Mod I   attention    Pain   On gabapentin and PRN tramadol. No c/o pain  pain less than 3  Assess pain management qshift and PRN   Skin   No skin impairments  remain free of skin impairments  skin assessment qshift and PRN      *See Care Plan and progress notes for long and short-term goals.     Barriers to Discharge  Current Status/Progress Possible Resolutions Date Resolved   Physician    Medical stability     initiating rehab  see above      Nursing  Inaccessible home environment;Decreased caregiver support;Home environment access/layout;Medication compliance;Lack of/limited family support               PT  Decreased caregiver support  wife works a few hours/day, family going to cover hours she is gone              OT                  SLP                SW                Discharge Planning/Teaching Needs:    HOme with wife who works some  but has grown children and grandchildren who can assist. Plans on being mod/i when he leaves here.     Team Discussion:  New eval being seen and setting goals with. PT feels can get supervision wheelchair level. Speech issues and swallowing-not seen yet.   Revisions to Treatment Plan:  New eval    Continued Need for Acute Rehabilitation Level of Care: The patient requires daily medical management by a physician with specialized training in physical medicine and rehabilitation for the following conditions: Daily direction of a multidisciplinary physical rehabilitation program to ensure safe treatment while eliciting the highest outcome that is of practical value to the patient.: Yes Daily medical management of patient stability for increased activity during participation in an intensive rehabilitation  regime.: Yes Daily analysis of laboratory values and/or radiology reports with any subsequent need for medication adjustment of medical intervention for : Neurological problems   I attest that I was present, lead the team conference, and concur with the assessment and plan of the team.   Lucy Chris 09/07/2018, 1:34 PM

## 2018-09-07 NOTE — Evaluation (Signed)
Speech Language Pathology Assessment and Plan  Patient Details  Name: Evan BOODY Sr. MRN: [id number] Date of Birth: 1955/09/29  SLP Diagnosis: Dysarthria;Cognitive Impairments;Dysphagia  Rehab Potential: Excellent ELOS: 09/21/18    Today's Date: 09/07/2018 SLP Individual Time: 4098-1191 SLP Individual Time Calculation (min): 60 min   Problem List:  Patient Active Problem List   Diagnosis Date Noted  . Lacunar infarct, acute ([place]) 09/06/2018  . TIA (transient ischemic attack) 09/01/2018  . Left leg pain 01/13/2018  . CVA (cerebral vascular accident) ([place]) 09/10/2017  . Hyperlipidemia 02/11/2017  . Acute CVA (cerebrovascular accident) ([place]) 11/10/2016  . Left sided numbness 11/10/2016  . Slurred speech 11/10/2016  . Essential hypertension 11/10/2016  . Diabetes ([place]) 11/10/2016   Past Medical History:  Past Medical History:  Diagnosis Date  . Diabetes 1.5, managed as type 2 ([place])   . Diabetes mellitus without complication ([place])   . Hypercholesterolemia   . Hypertension   . Pain in both feet   . Stroke [facility])    Past Surgical History: History reviewed. No pertinent surgical history.  Assessment / Plan / Recommendation Clinical Impression Patient is a 63 year old right-handed male history of CVA in the past with no residual deficits, diabetes mellitus, hypertension, hyperlipidemia, tobacco abuse. Presented 09/01/2020 [facility] with left facial droop and left-sided weakness as well as slurred speech. Per chart review patient lives with spouse. One level home with 5 steps to entry. Independent prior to admission. Wife works during the day. He does not drive. Cranial CT scan negative. Urine drug screen positive for cocaine. Patient did not receive TPA. Carotid Dopplers in no ICA stenosis. MRI/MRA showed positive for acute lacunar infarction of the posterior limb right internal capsule. No associated hemorrhage. MRA with no significant stenosis. Echocardiogram with  ejection fraction of 47% grade 1 diastolic dysfunction no regional wall motion abnormalities. Maintain on aspirin and Plavix for CVA prophylaxis. Subcutaneous Lovenox for DVT prophylaxis. Follow-up speech therapy for dysphagia mechanical soft nectar thick liquids. Therapy evaluations completed and patient was admitted for a comprehensive rehabilitation program 09/06/18.  Patient demonstrates mild cognitive impairments characterized by impulsivity and decreased awareness of deficits which impacts his safety with functional and familiar tasks. Patient also demonstrates moderate oral-motor weakness and decreased lingual ROM which results in imprecise consonants and decreased intelligibility at the phrase level. Oral weakness also results in prolonged mastication and mild oral residue with solid textures that patient clears with liquid washes. Patient consumed trials of thin liquids via cup without overt s/s of aspiration but demonstrated what appeared to be a delayed swallow initiation.  Recommend patient continues current diet of Dys. 2 textures with nectar-thick liquids and initiate the water protocol with repeat MBS within the next few days to assess readiness for possible upgrade. Patient would benefit from skilled SLP intervention in order to maximize his swallowing and cognitive function as well as his speech intelligibility prior to discharge.    Skilled Therapeutic Interventions          Administered a BSE and cognitive-linguistic evaluation, please see above for details.   SLP Assessment  Patient will need skilled Speech Lanaguage Pathology Services during CIR admission    Recommendations  SLP Diet Recommendations: Dysphagia 3 (Mech soft);Free water protocol after oral care;Nectar Liquid Administration via: Cup;Straw Medication Administration: Whole meds with puree Supervision: Patient able to self feed;Full supervision/cueing for compensatory strategies Compensations: Minimize environmental  distractions;Slow rate;Small sips/bites;Lingual sweep for clearance of pocketing;Multiple dry swallows after each bite/sip;Follow solids with liquid Postural  Changes and/or Swallow Maneuvers: Seated upright 90 degrees Oral Care Recommendations: Oral care BID Recommendations for Other Services: Neuropsych consult Patient destination: Home Follow up Recommendations: 24 hour supervision/assistance;Home Health SLP Equipment Recommended: To be determined    SLP Frequency 3 to 5 out of 7 days   SLP Duration  SLP Intensity  SLP Treatment/Interventions 09/21/18  Minumum of 1-2 x/day, 30 to 90 minutes  Cognitive remediation/compensation;Environmental controls;Internal/external aids;Speech/Language facilitation;Therapeutic Activities;Patient/family education;Functional tasks;Dysphagia/aspiration precaution training;Cueing hierarchy    Pain No/Denies Pain   Short Term Goals: Week 1: SLP Short Term Goal 1 (Week 1): Patient will consume current diet with minimal overt s/s of aspiration with supervision verbal cues for use of swallowing compensatory strategies.  SLP Short Term Goal 2 (Week 1): Patient will consume trials of thin liquids via cup with minimal overt s/s of aspiration with supervision verbal cues over 2 sessions to assess readiness for repeat MBS.  SLP Short Term Goal 3 (Week 1): Patient will utilize speech intelligibility strategies at the phrase level with Mod A verbal cues to achieve ~90% intelligibility.  SLP Short Term Goal 4 (Week 1): Patient will self-monitor and correct errors during functional tasks with Min A verbal cues.  Refer to Care Plan for Long Term Goals  Recommendations for other services: Neuropsych  Discharge Criteria: Patient will be discharged from SLP if patient refuses treatment 3 consecutive times without medical reason, if treatment goals not met, if there is a change in medical status, if patient makes no progress towards goals or if patient is discharged from  hospital.  The above assessment, treatment plan, treatment alternatives and goals were discussed and mutually agreed upon: by patient  Chistina Roston 09/07/2018, 2:20 PM

## 2018-09-07 NOTE — Plan of Care (Signed)
  Problem: Consults Goal: RH STROKE PATIENT EDUCATION Description See Patient Education module for education specifics  Outcome: Progressing Goal: Diabetes Guidelines if Diabetic/Glucose > 140 Description If diabetic or lab glucose is > 140 mg/dl - Initiate Diabetes/Hyperglycemia Guidelines & Document Interventions  Outcome: Progressing   Problem: RH BOWEL ELIMINATION Goal: RH STG MANAGE BOWEL WITH ASSISTANCE Description STG Manage Bowel with min Assistance.  Outcome: Progressing   Problem: RH BLADDER ELIMINATION Goal: RH STG MANAGE BLADDER WITH ASSISTANCE Description STG Manage Bladder With min Assistance  Outcome: Progressing   Problem: RH SKIN INTEGRITY Goal: RH STG MAINTAIN SKIN INTEGRITY WITH ASSISTANCE Description STG Maintain Skin Integrity With min Assistance.  Outcome: Progressing   Problem: RH SAFETY Goal: RH STG ADHERE TO SAFETY PRECAUTIONS W/ASSISTANCE/DEVICE Description STG Adhere to Safety Precautions With min Assistance and appropriate assistive Device.  Outcome: Progressing   Problem: RH PAIN MANAGEMENT Goal: RH STG PAIN MANAGED AT OR BELOW PT'S PAIN GOAL Description <3 on a 0-10 pain scale  Outcome: Progressing   Problem: RH KNOWLEDGE DEFICIT Goal: RH STG INCREASE KNOWLEDGE OF DIABETES Description Patient will demonstrate knowledge of diabetes medications, dietary restrictions, and how to manage hypo/hyperglycemic episodes. Follow up care with the doctor post discharge with min assist from staff.  Outcome: Progressing Goal: RH STG INCREASE KNOWLEDGE OF HYPERTENSION Description Patient will demonstrate knowledge of HTN medications, dietary restrictions, BP parameters, and follow up care with the MD post discharge with min assist from staff.  Outcome: Progressing Goal: RH STG INCREASE KNOWLEDGE OF DYSPHAGIA/FLUID INTAKE Description Patient will demonstrate knowledge of proper swallowing techniques, managing fluid intake with thickened fluids, and  follow up care with the MD post discharge with min assist from staff.  Outcome: Progressing Goal: RH STG INCREASE KNOWLEGDE OF HYPERLIPIDEMIA Description Patient will demonstrate knowledge of HLD medications, dietary restrictions, and follow up care with the MD post discharge with min assist from staff.  Outcome: Progressing Goal: RH STG INCREASE KNOWLEDGE OF STROKE PROPHYLAXIS Description Patient will demonstrate knowledge of stroke prophylactic medications and how they are used to help prevent future strokes. Follow up care with the MD post discharge with min assist from staff.   Outcome: Progressing   

## 2018-09-08 ENCOUNTER — Inpatient Hospital Stay (HOSPITAL_COMMUNITY): Payer: Medicaid Other

## 2018-09-08 ENCOUNTER — Inpatient Hospital Stay (HOSPITAL_COMMUNITY): Payer: Medicaid Other | Admitting: Speech Pathology

## 2018-09-08 ENCOUNTER — Inpatient Hospital Stay (HOSPITAL_COMMUNITY): Payer: Medicaid Other | Admitting: Physical Therapy

## 2018-09-08 LAB — GLUCOSE, CAPILLARY
Glucose-Capillary: 99 mg/dL (ref 70–99)
Glucose-Capillary: 121 mg/dL — ABNORMAL HIGH (ref 70–99)
Glucose-Capillary: 129 mg/dL — ABNORMAL HIGH (ref 70–99)
Glucose-Capillary: 134 mg/dL — ABNORMAL HIGH (ref 70–99)

## 2018-09-08 MED ORDER — RESOURCE THICKENUP CLEAR PO POWD
ORAL | Status: DC | PRN
  Filled 2018-09-08 (×2): qty 125

## 2018-09-08 MED ORDER — TRAZODONE HCL 50 MG PO TABS
25.0000 mg | ORAL_TABLET | Freq: Every evening | ORAL | Status: DC | PRN
  Administered 2018-09-08 – 2018-09-21 (×5): 50 mg via ORAL
  Filled 2018-09-08 (×11): qty 1

## 2018-09-08 NOTE — IPOC Note (Signed)
Overall Plan of Care [facility]) Patient Details Name: Evan HAYENGA Sr. MRN: [id number] DOB: [birth year]  Admitting Diagnosis: <principal problem not specified>  Hospital Problems: Active Problems:   Lacunar infarct, acute (HCC)     Functional Problem List: Nursing Endurance, Medication Management, Nutrition, Perception, Safety, Skin Integrity  PT Balance, Endurance, Motor, Safety, Behavior  OT Balance, Cognition, Endurance, Motor, Pain, Perception, Safety  SLP    TR         Basic ADL's: OT Grooming, Bathing, Dressing, Toileting     Advanced  ADL's: OT       Transfers: PT Bed Mobility, Bed to Chair, Car, Furniture, Floor  OT Toilet, Research scientist (life sciences): PT Ambulation, Psychologist, prison and probation services, Stairs     Additional Impairments: OT Fuctional Use of Upper Extremity  SLP Swallowing, Communication expression Awareness, Attention  TR      Anticipated Outcomes Item Anticipated Outcome  Self Feeding set up  Swallowing  Mod I   Basic self-care  S - min A  Toileting  min A   Bathroom Transfers min A  Bowel/Bladder  Mod I with bowel and bladder  Transfers  supervision  Locomotion  supervision w/c propulsion, CGA household gait   Communication  Supervision  Cognition  Mod I   Pain  <3 on a 0-10 pain scale  Safety/Judgment  Supervision with safety transfers   Therapy Plan: PT Intensity: Minimum of 1-2 x/day ,45 to 90 minutes PT Frequency: 5 out of 7 days PT Duration Estimated Length of Stay: 14 days OT Intensity: Minimum of 1-2 x/day, 45 to 90 minutes OT Frequency: 5 out of 7 days OT Duration/Estimated Length of Stay: 2 weeks SLP Intensity: Minumum of 1-2 x/day, 30 to 90 minutes SLP Frequency: 3 to 5 out of 7 days SLP Duration/Estimated Length of Stay: 09/21/18    Team Interventions: Nursing Interventions Patient/Family Education, Dysphagia/Aspiration Precaution Training, Medication Management, Discharge Planning, Psychosocial Support, Disease  Management/Prevention  PT interventions Ambulation/gait training, Community reintegration, Fish farm manager, Neuromuscular re-education, Psychosocial support, Stair training, UE/LE Strength taining/ROM, Wheelchair propulsion/positioning, UE/LE Coordination activities, Therapeutic Activities, Skin care/wound management, Pain management, Functional electrical stimulation, Discharge planning, Warden/ranger, Cognitive remediation/compensation, Disease management/prevention, Functional mobility training, Patient/family education, Splinting/orthotics, Therapeutic Exercise, Visual/perceptual remediation/compensation  OT Interventions Balance/vestibular training, Neuromuscular re-education, Self Care/advanced ADL retraining, Therapeutic Exercise, Wheelchair propulsion/positioning, Cognitive remediation/compensation, DME/adaptive equipment instruction, Pain management, UE/LE Strength taining/ROM, Community reintegration, Functional electrical stimulation, Patient/family education, Splinting/orthotics, UE/LE Coordination activities, Discharge planning, Functional mobility training, Psychosocial support, Therapeutic Activities  SLP Interventions Cognitive remediation/compensation, Environmental controls, Internal/external aids, Speech/Language facilitation, Therapeutic Activities, Patient/family education, Functional tasks, Dysphagia/aspiration precaution training, Cueing hierarchy  TR Interventions    SW/CM Interventions Discharge Planning, Psychosocial Support, Patient/Family Education   Barriers to Discharge MD  Medical stability, Home enviroment access/loayout and Lack of/limited family support  Nursing Inaccessible home environment, Decreased caregiver support, Home environment access/layout, Medication compliance, Lack of/limited family support    PT Decreased caregiver support wife works a few hours/day, family going to cover hours she is gone  Psychiatric nurse (comments) none known  at this time  SLP      SW       Team Discharge Planning: Destination: PT-Home ,OT- Home , SLP-Home Projected Follow-up: PT-Home health PT, OT-  24 hour supervision/assistance, Home health OT, SLP-24 hour supervision/assistance, Home Health SLP Projected Equipment Needs: PT-To be determined, OT- To be determined, SLP-To be determined Equipment Details: PT- , OT-  Patient/family involved in discharge planning: PT- Patient,  OT-Patient, SLP-Patient  MD ELOS: 12-14d Medical Rehab Prognosis:  Excellent Assessment:  63 year old right-handed male history of CVA in the past with no residual deficits, diabetes mellitus, hypertension, hyperlipidemia, tobacco abuse. Presented 09/01/2020 [facility] with left facial droop and left-sided weakness as well as slurred speech. Per chart review patient lives with spouse. One level home with 5 steps to entry. Independent prior to admission. Wife works during the day. He does not drive. Cranial CT scan negative. Urine drug screen positive for cocaine. Patient did not receive TPA. Carotid Dopplers in no ICA stenosis. MRI/MRA showed positive for acute lacunar infarction of the posterior limb right internal capsule. No associated hemorrhage. MRA with no significant stenosis. Echocardiogram with ejection fraction of 65% grade 1 diastolic dysfunction no regional wall motion abnormalities. Maintain on aspirin and Plavix for CVA prophylaxis. Subcutaneous Lovenox for DVT prophylaxis. Follow-up speech therapy for dysphagia mechanical soft nectar thick liquids. Therapy evaluations completed and patient was admitted for a comprehensive rehabilitation program.   Now requiring 24/7 Rehab RN,MD, as well as CIR level PT, OT and SLP.  Treatment team will focus on ADLs and mobility with goals set at sup/min A See Team Conference Notes for weekly updates to the plan of care

## 2018-09-08 NOTE — Care Management Note (Signed)
Inpatient Rehabilitation Center Individual Statement of Services  Patient Name:  Evan HARNAGE Sr.  Date:  09/08/2018  Welcome to the Inpatient Rehabilitation Center.  Our goal is to provide you with an individualized program based on your diagnosis and situation, designed to meet your specific needs.  With this comprehensive rehabilitation program, you will be expected to participate in at least 3 hours of rehabilitation therapies Monday-Friday, with modified therapy programming on the weekends.  Your rehabilitation program will include the following services:  Physical Therapy (PT), Occupational Therapy (OT), Speech Therapy (ST), 24 hour per day rehabilitation nursing, Neuropsychology, Case Management (Social Worker), Rehabilitation Medicine, Nutrition Services and Pharmacy Services  Weekly team conferences will be held on Wednesday to discuss your progress.  Your Social Worker will talk with you frequently to get your input and to update you on team discussions.  Team conferences with you and your family in attendance may also be held.  Expected length of stay: 2 weeks  Overall anticipated outcome: supervision with cueing  Depending on your progress and recovery, your program may change. Your Social Worker will coordinate services and will keep you informed of any changes. Your Social Worker's name and contact numbers are listed  below.  The following services may also be recommended but are not provided by the Inpatient Rehabilitation Center:   Driving Evaluations  Home Health Rehabiltiation Services  Outpatient Rehabilitation Services  Vocational Rehabilitation   Arrangements will be made to provide these services after discharge if needed.  Arrangements include referral to agencies that provide these services.  Your insurance has been verified to be:  medicaid Your primary doctor is:  None  Pertinent information will be shared with your doctor and your insurance  company.  Social Worker:  Dossie Der, SW [phone] or [phone]  Information discussed with and copy given to patient by: Lucy Chris, 09/08/2018, 9:15 AM

## 2018-09-08 NOTE — Progress Notes (Signed)
Speech Language Pathology Daily Session Note  Patient Details  Name: Evan KOLLMAN Sr. MRN: [id number] Date of Birth: 08-Mar-1956  Today's Date: 09/08/2018 SLP Individual Time: 4103-0131 SLP Individual Time Calculation (min): 45 min  Short Term Goals: Week 1: SLP Short Term Goal 1 (Week 1): Patient will consume current diet with minimal overt s/s of aspiration with supervision verbal cues for use of swallowing compensatory strategies.  SLP Short Term Goal 2 (Week 1): Patient will consume trials of thin liquids via cup with minimal overt s/s of aspiration with supervision verbal cues over 2 sessions to assess readiness for repeat MBS.  SLP Short Term Goal 3 (Week 1): Patient will utilize speech intelligibility strategies at the phrase level with Mod A verbal cues to achieve ~90% intelligibility.  SLP Short Term Goal 4 (Week 1): Patient will self-monitor and correct errors during functional tasks with Min A verbal cues.  Skilled Therapeutic Interventions: Skilled treatment session focused on dysphagia and speech goals. SLP facilitated session by providing skilled observation with trials of thin liquids via cup after oral care. Patient demonstrated delayed and intermittent throat clearing, suspect due to questionable delay in swallow initiation. Recommend continued trials with SLP. Patient also consumed breakfast meal of Dys. 3 textures with netcar-thick liquids and was overall Mod I to clear oral residue/pocketing. Patient with overt coughing X 2, suspect due to oral residue from sausage "crumbles." Recommend patient continue current diet with full supervision. Patient left upright in wheelchair with alarm on and all needs within reach. Continue with current plan of care.      Pain No/Denies Pain   Therapy/Group: Individual Therapy  Susane Bey 09/08/2018, 12:50 PM

## 2018-09-08 NOTE — Progress Notes (Signed)
Physical Therapy Session Note  Patient Details  Name: Evan Townsend. MRN: [id number] Date of Birth: 12-28-1955  Today's Date: 09/08/2018 PT Individual Time: 1100-1155 PT Individual Time Calculation (min): 55 min   Short Term Goals: Week 1:  PT Short Term Goal 1 (Week 1): Pt will transfer bed<>chair w/ min assist consistently PT Short Term Goal 2 (Week 1): Pt will perform bed mobility w/ min assist PT Short Term Goal 3 (Week 1): Pt will maintain midline oritentation w/ functional mobility 100% of the time w/ min cues   Skilled Therapeutic Interventions/Progress Updates:   Pt in supine and agreeable to therapy, denies pain. Min assist transfer to EOB and to w/c via squat pivot. Total assist w/c transport to/from therapy gym for time management. Worked on Investment banker, operational w/ RW this session. Ambulated 15' x2 w/ RW and LUE orthosis. LLE in DF ACE wrap to assist w/ toe clearance. Max assist overall for RW management, upright balance and lateral weight shifting. Mod assist for LLE step placement and limb advancement, and L knee extension in single leg stance. Tactile cues on both sides of knee to prevent hyperextension. NuStep 5 min @ level 1 w/ LEs only and occasional RUE use to assist, needed manual cues for neutral LLE alignment. Emphasis on LLE muscle activation. Participated in therapeutic card game w/ other patients, therapist providing 1:1 support. Pt utilizing RUE only and performing dynamic sitting balance while participating and conversing w/ other patients, close supervision-CGA for balance in seated. Returned to room and ended session in w/c, all needs in reach.   Therapy Documentation Precautions:  Precautions Precautions: Fall Restrictions Weight Bearing Restrictions: No  Therapy/Group: Individual Therapy  Yvone Slape Melton Krebs 09/08/2018, 12:07 PM

## 2018-09-08 NOTE — Progress Notes (Signed)
Occupational Therapy Session Note  Patient Details  Name: Evan PRUSS Sr. MRN: [id number] Date of Birth: [birth year]  Today's Date: 09/08/2018 OT Individual Time: 1500-1526 OT Individual Time Calculation (min): 26 min    Short Term Goals: Week 1:  OT Short Term Goal 1 (Week 1): Pt will perform UB dressing with min A in order to decrease level of assist with self care. OT Short Term Goal 2 (Week 1): Pt will perform shower transfer with mod A to decrease level of assist with self care.  OT Short Term Goal 3 (Week 1): Pt will demonstrate LB dressing with mod I utilizing hemiplegic dressing techniques.   Skilled Therapeutic Interventions/Progress Updates:    1;1. Pt received asleep, easily aroused and declines pain. Pt completes stand pivot transfers EOB<>w/c with min A and VC to wait d/t impulsivity. Pt completes standing ~15 min at high low table with VC for equal weight bearing, not to lock knees out and WB into LUE with min A for balance while playing checkers. L inattention noted to L side of board requiring min VC. Exited session with pt seated in bed, call light in reach and all needs met  Therapy Documentation Precautions:  Precautions Precautions: Fall Restrictions Weight Bearing Restrictions: No General:   Vital Signs: Therapy Vitals Temp: 97.8 F (36.6 C) Temp Source: Oral Pulse Rate: (!) 54 Resp: 18 BP: 126/79 Patient Position (if appropriate): Lying Oxygen Therapy SpO2: 99 % O2 Device: Room Air Pain:   ADL:   Vision   Perception    Praxis   Exercises:   Other Treatments:     Therapy/Group: Individual Therapy  Tonny Branch 09/08/2018, 3:30 PM

## 2018-09-08 NOTE — Progress Notes (Signed)
[place] PHYSICAL MEDICINE & REHABILITATION PROGRESS NOTE   Subjective/Complaints:  Patient has no complaints today he thinks his drooling is a little bit better.  Speech remains severely dysarthric  ROS- neg CP, SOB, N/V/D  Objective:   No results found. Recent Labs    09/06/18 1542 09/07/18 0641  WBC 5.7 4.6  HGB 12.8* 13.1  HCT 40.0 41.0  PLT 254 237   Recent Labs    09/06/18 1542 09/07/18 0641  NA  --  140  K  --  4.4  CL  --  112*  CO2  --  21*  GLUCOSE  --  161*  BUN  --  37*  CREATININE 1.68* 1.39*  CALCIUM  --  9.0    Intake/Output Summary (Last 24 hours) at 09/08/2018 1547 Last data filed at 09/08/2018 0900 Gross per 24 hour  Intake 480 ml  Output 525 ml  Net -45 ml     Physical Exam: Vital Signs Blood pressure 126/79, pulse (!) 54, temperature 97.8 F (36.6 C), temperature source Oral, resp. rate 18, height 5\' 6"  (1.676 m), weight 68.1 kg, SpO2 99 %.   General: No acute distress Mood and affect are appropriate Heart: bradycardia , normal rhythm no rubs murmurs or extra sounds Lungs: Clear to auscultation, breathing unlabored, no rales or wheezes Abdomen: Positive bowel sounds, soft nontender to palpation, nondistended Extremities: No clubbing, cyanosis, or edema Skin: No evidence of breakdown, no evidence of rash Neurologic: Cranial nerves II through XII intact, motor strength is 5/5 in RIght deltoid, bicep, tricep, grip, hip flexor, knee extensors, ankle dorsiflexor and plantar flexor, 0/5 LUE, 3- LLE Quad and hip add, 0/5 Left ankle DF/PF Sensory exam normal sensation to light touch and proprioception in bilateral upper and lower extremities Cerebellar exam normal unable to do finger to nose to finger left upper     Musculoskeletal: Full range of motion in all 4 extremities. No joint swelling   Assessment/Plan: 1. 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  Physiatrist is providing close team supervision and 24 hour management of active medical problems listed below.  Physiatrist and rehab team continue to assess barriers to discharge/monitor patient progress toward functional and medical goals  Care Tool:  Bathing    Body parts bathed by patient: Left arm, Chest, Abdomen, Front perineal area, Right upper leg, Left upper leg   Body parts bathed by helper: Right arm, Buttocks, Right lower leg, Left lower leg, Face     Bathing assist Assist Level: Moderate Assistance - Patient 50 - 74%     Upper Body Dressing/Undressing Upper body dressing   What is the patient wearing?: Pull over shirt    Upper body assist Assist Level: Maximal Assistance - Patient 25 - 49%    Lower Body Dressing/Undressing Lower body dressing      What is the patient wearing?: Underwear/pull up, Pants     Lower body assist Assist for lower body dressing: Maximal Assistance - Patient 25 - 49%     Toileting Toileting    Toileting assist Assist for toileting: Maximal Assistance - Patient 25 - 49%     Transfers Chair/bed transfer  Transfers assist     Chair/bed transfer assist level: Minimal Assistance - Patient > 75%     Locomotion Ambulation   Ambulation assist      Assist level: Maximal Assistance - Patient 25 -  49% Assistive device: Walker-rolling(LUE orthosis) Max distance: 15'   Walk 10 feet activity   Assist     Assist level: Maximal Assistance - Patient 25 - 49% Assistive device: Walker-rolling   Walk 50 feet activity   Assist Walk 50 feet with 2 turns activity did not occur: Safety/medical concerns         Walk 150 feet activity   Assist Walk 150 feet activity did not occur: Safety/medical concerns         Walk 10 feet on uneven surface  activity   Assist Walk 10 feet on uneven surfaces activity did not occur: Safety/medical concerns          Wheelchair     Assist Will patient use wheelchair at discharge?: (TBD) Type of Wheelchair: Manual    Wheelchair assist level: Supervision/Verbal cueing Max wheelchair distance: 150'    Wheelchair 50 feet with 2 turns activity    Assist        Assist Level: Supervision/Verbal cueing   Wheelchair 150 feet activity     Assist     Assist Level: Supervision/Verbal cueing    Medical Problem List and Plan: 1.   Left side weakness with dysarthria/dysphagia secondary to lacunar infarction in the posterior limb right internal capsule             -CIR PT, OT,  SLP evals today 2.  DVT Prophylaxis/Anticoagulation: subcutaneous Lovenox. Monitor for any bleeding episodes 3. Pain Management: Neurontin 600 mg 3 times a day,tramadol as needed             -support while bed/chair to reduce sublux 4. Mood:  Provide emotional support 5. Neuropsych: This patient is capable of making decisions on his own behalf. 6. Skin/Wound Care:  Routine skin checks 7. Fluids/Electrolytes/Nutrition:  Routine in and out's with follow-up chemistries 8. Dysphagia. Dysphagia #3 nectar thick liquids. Follow-up speech therapy, advance as tolerated 9. Diabetes mellitus with peripheral neuropathy. Hemoglobin A1c 10.3. Glucotrol 10 mg daily, Levemir 12 units daily. Check blood sugars before meals and at bedtime. Diabetic teaching CBG (last 3)  Recent Labs    09/07/18 2112 09/08/18 0644 09/08/18 1202  GLUCAP 130* 99 121*  fair control 09/08/2018 10. Hypertension. Lisinopril 40 mg daily. Vitals:   09/08/18 0900 09/08/18 1449  BP:  126/79  Pulse: 60 (!) 54  Resp:  18  Temp:  97.8 F (36.6 C)  SpO2:  99%  controlled 09/08/2018,bradycardia EKG 1/16 sinus  Brady , asymptomatic 11. Hyperlipidemia. Lipitor 12. Tobacco as well as polysubstance abuse. NicoDerm patch. Provide counseling     LOS: 2 days A FACE TO FACE EVALUATION WAS PERFORMED  Erick Colacendrew E Julieth Tugman 09/08/2018, 3:47 PM

## 2018-09-08 NOTE — Progress Notes (Signed)
Occupational Therapy Session Note  Patient Details  Name: Evan METZEN Sr. MRN: [id number] Date of Birth: 1955-11-05  Today's Date: 09/08/2018 OT Individual Time: 7416-3845 OT Individual Time Calculation (min): 58 min    Short Term Goals: Week 1:  OT Short Term Goal 1 (Week 1): Pt will perform UB dressing with min A in order to decrease level of assist with self care. OT Short Term Goal 2 (Week 1): Pt will perform shower transfer with mod A to decrease level of assist with self care.  OT Short Term Goal 3 (Week 1): Pt will demonstrate LB dressing with mod I utilizing hemiplegic dressing techniques.   Skilled Therapeutic Interventions/Progress Updates:    1:1. Denies pain. Pt receieved in w/c and impulsivelytrying to unlock w/c to "get going." Pt propels w/c with min VC for hemi technique. Pt completes squat pivot trasnfer with VC for managing w/c parts as well as foot placement prior to transfer. Pt completes 2x20 sit ups from large foam wedge with OT stabilizing BL Eonto floor. Pt completes 2x20 elbow taps to mat on R for L core activation and dynamic sitting balance and lateral leans onto L elbow/forearm/wrist for WB/deep proprioceptive input into LUE for NMR. PT completes 5 sit to stand with L knee block and MOD A for sitting balance from EOM. Exited session with pt seated in bed, esit alarm on and call light in reach  Therapy Documentation Precautions:  Precautions Precautions: Fall Restrictions Weight Bearing Restrictions: No General:     Therapy/Group: Individual Therapy    Shon Hale 09/08/2018, 9:29 AM

## 2018-09-08 NOTE — Plan of Care (Signed)
  Problem: Consults Goal: RH STROKE PATIENT EDUCATION Description See Patient Education module for education specifics  Outcome: Progressing Goal: Diabetes Guidelines if Diabetic/Glucose > 140 Description If diabetic or lab glucose is > 140 mg/dl - Initiate Diabetes/Hyperglycemia Guidelines & Document Interventions  Outcome: Progressing   Problem: RH BOWEL ELIMINATION Goal: RH STG MANAGE BOWEL WITH ASSISTANCE Description STG Manage Bowel with min Assistance.  Outcome: Progressing   Problem: RH BLADDER ELIMINATION Goal: RH STG MANAGE BLADDER WITH ASSISTANCE Description STG Manage Bladder With min Assistance  Outcome: Progressing   Problem: RH SKIN INTEGRITY Goal: RH STG MAINTAIN SKIN INTEGRITY WITH ASSISTANCE Description STG Maintain Skin Integrity With min Assistance.  Outcome: Progressing   Problem: RH SAFETY Goal: RH STG ADHERE TO SAFETY PRECAUTIONS W/ASSISTANCE/DEVICE Description STG Adhere to Safety Precautions With min Assistance and appropriate assistive Device.  Outcome: Progressing   Problem: RH PAIN MANAGEMENT Goal: RH STG PAIN MANAGED AT OR BELOW PT'S PAIN GOAL Description <3 on a 0-10 pain scale  Outcome: Progressing   Problem: RH KNOWLEDGE DEFICIT Goal: RH STG INCREASE KNOWLEDGE OF DIABETES Description Patient will demonstrate knowledge of diabetes medications, dietary restrictions, and how to manage hypo/hyperglycemic episodes. Follow up care with the doctor post discharge with min assist from staff.  Outcome: Progressing Goal: RH STG INCREASE KNOWLEDGE OF HYPERTENSION Description Patient will demonstrate knowledge of HTN medications, dietary restrictions, BP parameters, and follow up care with the MD post discharge with min assist from staff.  Outcome: Progressing Goal: RH STG INCREASE KNOWLEDGE OF DYSPHAGIA/FLUID INTAKE Description Patient will demonstrate knowledge of proper swallowing techniques, managing fluid intake with thickened fluids, and  follow up care with the MD post discharge with min assist from staff.  Outcome: Progressing Goal: RH STG INCREASE KNOWLEGDE OF HYPERLIPIDEMIA Description Patient will demonstrate knowledge of HLD medications, dietary restrictions, and follow up care with the MD post discharge with min assist from staff.  Outcome: Progressing Goal: RH STG INCREASE KNOWLEDGE OF STROKE PROPHYLAXIS Description Patient will demonstrate knowledge of stroke prophylactic medications and how they are used to help prevent future strokes. Follow up care with the MD post discharge with min assist from staff.   Outcome: Progressing   

## 2018-09-08 NOTE — Progress Notes (Signed)
Patient information reviewed and entered into [facility] by Becky Aryonna Gunnerson, PPS coordinator. Information including medical coding, function ability, and quality indicators will be reviewed and updated through discharge.   

## 2018-09-08 NOTE — Progress Notes (Signed)
Social Work  Social Work Assessment and Plan  Patient Details  Name: Evan GOAD Sr. MRN: [id number] Date of Birth: 1955/10/22  Today's Date: 09/08/2018  Problem List:  Patient Active Problem List   Diagnosis Date Noted  . Lacunar infarct, acute (HCC) 09/06/2018  . TIA (transient ischemic attack) 09/01/2018  . Left leg pain 01/13/2018  . CVA (cerebral vascular accident) (HCC) 09/10/2017  . Hyperlipidemia 02/11/2017  . Acute CVA (cerebrovascular accident) (HCC) 11/10/2016  . Left sided numbness 11/10/2016  . Slurred speech 11/10/2016  . Essential hypertension 11/10/2016  . Diabetes (HCC) 11/10/2016   Past Medical History:  Past Medical History:  Diagnosis Date  . Diabetes 1.5, managed as type 2 (HCC)   . Diabetes mellitus without complication (HCC)   . Hypercholesterolemia   . Hypertension   . Pain in both feet   . Stroke [facility])    Past Surgical History: History reviewed. No pertinent surgical history. Social History:  reports that he has been smoking cigarettes. He started smoking about 38 years ago. He has been smoking about 0.25 packs per day. He has never used smokeless tobacco. He reports current alcohol use of about 1.0 standard drinks of alcohol per week. He reports that he does not use drugs.  Family / Support Systems Marital Status: Married Patient Roles: Spouse, Parent, Other (Comment)(employee) Spouse/Significant Other: Beverly [phone] Children: Four grown children and numerous grandchildren Other Supports: Friends and neighbors Anticipated Caregiver: Wife and children Ability/Limitations of Caregiver: Wife works some but reports the rest of the fmaily can be there with him if needed Caregiver Availability: Other (Comment)(need to come up with a plan if 24 hr care needed) Family Dynamics: Close knit family pt reports they all are there for one another. He has always been independent and would like to get back to this. He is not one to ask for help with  his care. He feels his family will provide waht ever he needs.  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 He does want his wife involved also.   Abuse/Neglect Abuse/Neglect Assessment Can Be Completed: Yes Physical Abuse: Denies Verbal Abuse: Denies Sexual Abuse: Denies Exploitation of patient/patient's resources: Denies Self-Neglect: Denies  Emotional Status Pt's affect, behavior and adjustment status: Pt is motivated to do well and recover from this stroke. He is not happy he has had another one and this one is worse the the first. He plans to work hard and recover, he wants his speech to get better and swallow to improve. He is glad to be here on rehab. Recent Psychosocial Issues: other health issues-history CVA and health issues was not going to a MD will need a PCP at [place] Psychiatric History: No history deferred depression screen due to coping appropriately but would benefit from seeing neruo-psych while here for his coping and to address his substance abuse positive when admitted. Will make referral. Substance Abuse History: Tobacco also positive for cocaine upon admission he does not admit to this. Will have neuro-psych see to address.  Patient / Family Perceptions, Expectations & Goals Pt/Family understanding of illness & functional limitations: Pt and wife can explain his stroke and deficits. Both have spoken with the MD and feel their questions have been answered and addressed.  He is glad to be here and ready to work in therapies. Wife plans to be here to see him in therapies. Premorbid pt/family roles/activities: Husband, father,  grandfather, employee, friend, etc Anticipated changes in roles/activities/participation: resume Pt/family expectations/goals: Pt states: " I hope to get back where I was and do for myself."  Wife states: " I hope he makes good progress here because I know he doesn't like others to help him."  [employer]: Other (Comment)(had in past) Premorbid Home Care/DME Agencies: Other (Comment)(had after first stroke got rid of) Transportation available at discharge: family Resource referrals recommended: Neuropsychology, Support group (specify)  Discharge Planning Living Arrangements: Spouse/significant other Support Systems: Spouse/significant other, Children, Other relatives Type of Residence: Private residence Insurance Resources: [employer] (specify county) Architect: Employment, SSD, Family Support Financial Screen Referred: No Living Expenses: Own Money Management: Patient, Spouse Does the patient have any problems obtaining your medications?: Yes (Describe)(was not seeing a MD prior to admission) Home Management: Wife Patient/Family Preliminary Plans: Return home with wife who does work but will have family member be there if needed. Pt does not feel he will need someone with him at discharge, he feels he can manage when wife is working. Will await progress and go from here. Social Work Anticipated Follow Up Needs: HH/OP, Support Group  Clinical Impression Pleasant gentleman who is motivated to work and has been through this before with his first stroke. He realizes he will need to change his lifestyle habits and see a MD to take medications. Aware plan is 2-3 weeks here and he may require assist at discharge, but pt feels he can manage. Will make referral for neuro-psych to follow and address substance abuse issues.   Lucy Chris 09/08/2018, 9:12 AM

## 2018-09-09 ENCOUNTER — Inpatient Hospital Stay (HOSPITAL_COMMUNITY): Payer: Medicaid Other

## 2018-09-09 ENCOUNTER — Inpatient Hospital Stay (HOSPITAL_COMMUNITY): Payer: Medicaid Other | Admitting: Speech Pathology

## 2018-09-09 ENCOUNTER — Inpatient Hospital Stay (HOSPITAL_COMMUNITY): Payer: Medicaid Other | Admitting: Physical Therapy

## 2018-09-09 LAB — GLUCOSE, CAPILLARY
Glucose-Capillary: 117 mg/dL — ABNORMAL HIGH (ref 70–99)
Glucose-Capillary: 171 mg/dL — ABNORMAL HIGH (ref 70–99)
Glucose-Capillary: 99 mg/dL (ref 70–99)
Glucose-Capillary: 164 mg/dL — ABNORMAL HIGH (ref 70–99)

## 2018-09-09 NOTE — Progress Notes (Signed)
Physical Therapy Session Note  Patient Details  Name: Evan AVALO Sr. MRN: [id number] Date of Birth: 28-Oct-1955  Today's Date: 09/09/2018 PT Individual Time: 1545-1700 PT Individual Time Calculation (min): 75 min   Short Term Goals: Week 1:  PT Short Term Goal 1 (Week 1): Pt will transfer bed<>chair w/ min assist consistently PT Short Term Goal 2 (Week 1): Pt will perform bed mobility w/ min assist PT Short Term Goal 3 (Week 1): Pt will maintain midline oritentation w/ functional mobility 100% of the time w/ min cues   Skilled Therapeutic Interventions/Progress Updates:   Pt in asleep in supine and needed max stimulation to wake up. Pt reported he didn't sleep well last night and has been tired all day. Agreeable to therapy and denies pain. Supervision transfer to EOB and min assist transfer to w/c. Pt self-propelled w/c around unit w/ supervision via R hemi technique. Session focused on LLE NMR w/ emphasis on knee and hip musculature activation. Sit<>stands in parallel bars w/ CGA while therapist provides tactile cues for L knee extension w/o hyperextending in stance. Partial knee bends  w/ emphasis on slow and controlled L knee eccentric control, min-mod manual assist needed to prevent L knee buckling. Transitioned to sidelying and supine on mat. Performed L knee flexion and extension in gravity-minimized position, tactile cues for L hamstring activation. Supine bridges w/ tactile cues for L glut activation and resisted LLE D2 extension PNF pattern for posterior chain activation. NuStep 8 min @ level 1 w/ BLEs only and manual assist for neutral LLE alignment. Returned to room via w/c, ended session in recliner, all needs in reach. Called pt's wife to update her on his progress, per pt's request, left voicemail message.   Therapy Documentation Precautions:  Precautions Precautions: Fall Restrictions Weight Bearing Restrictions: No Vital Signs: Therapy Vitals Temp: (!) 97.5 F (36.4  C) Pulse Rate: (!) 37 Resp: 19 BP: 117/69 Patient Position (if appropriate): Lying Oxygen Therapy SpO2: 100 % O2 Device: Room Air  Therapy/Group: Individual Therapy  Tamana Hatfield Melton Krebs 09/09/2018, 5:05 PM

## 2018-09-09 NOTE — Progress Notes (Signed)
[place] PHYSICAL MEDICINE & REHABILITATION PROGRESS NOTE   Subjective/Complaints:  No issues overnite  ROS- neg CP, SOB, N/V/D  Objective:   No results found. Recent Labs    09/06/18 1542 09/07/18 0641  WBC 5.7 4.6  HGB 12.8* 13.1  HCT 40.0 41.0  PLT 254 237   Recent Labs    09/06/18 1542 09/07/18 0641  NA  --  140  K  --  4.4  CL  --  112*  CO2  --  21*  GLUCOSE  --  161*  BUN  --  37*  CREATININE 1.68* 1.39*  CALCIUM  --  9.0    Intake/Output Summary (Last 24 hours) at 09/09/2018 1443 Last data filed at 09/08/2018 2150 Gross per 24 hour  Intake 480 ml  Output 600 ml  Net -120 ml     Physical Exam: Vital Signs Blood pressure 140/72, pulse (!) 51, temperature 98.1 F (36.7 C), temperature source Oral, resp. rate 16, height 5\' 6"  (1.676 m), weight 68.1 kg, SpO2 100 %.   General: No acute distress Mood and affect are appropriate Heart: bradycardia , normal rhythm no rubs murmurs or extra sounds Lungs: Clear to auscultation, breathing unlabored, no rales or wheezes Abdomen: Positive bowel sounds, soft nontender to palpation, nondistended Extremities: No clubbing, cyanosis, or edema Skin: No evidence of breakdown, no evidence of rash Neurologic: Cranial nerves II through XII intact, motor strength is 5/5 in RIght deltoid, bicep, tricep, grip, hip flexor, knee extensors, ankle dorsiflexor and plantar flexor, 0/5 LUE, 3- LLE Quad and hip add, 2-/5 Left ankle DF/PF, increase tone  Sensory exam normal sensation to light touch and proprioception in bilateral upper and lower extremities Cerebellar exam normal unable to do finger to nose to finger left upper     Musculoskeletal: Full range of motion in all 4 extremities. No joint swelling   Assessment/Plan: 1. 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  Physiatrist is providing  close team supervision and 24 hour management of active medical problems listed below.  Physiatrist and rehab team continue to assess barriers to discharge/monitor patient progress toward functional and medical goals  Care Tool:  Bathing    Body parts bathed by patient: Left arm, Chest, Abdomen, Front perineal area, Right upper leg, Left upper leg   Body parts bathed by helper: Right arm, Buttocks, Right lower leg, Left lower leg, Face     Bathing assist Assist Level: Moderate Assistance - Patient 50 - 74%     Upper Body Dressing/Undressing Upper body dressing   What is the patient wearing?: Pull over shirt    Upper body assist Assist Level: Maximal Assistance - Patient 25 - 49%    Lower Body Dressing/Undressing Lower body dressing      What is the patient wearing?: Underwear/pull up, Pants     Lower body assist Assist for lower body dressing: Maximal Assistance - Patient 25 - 49%     Toileting Toileting    Toileting assist Assist for toileting: Maximal Assistance - Patient 25 - 49%     Transfers Chair/bed transfer  Transfers assist     Chair/bed transfer assist level: Minimal Assistance - Patient > 75%     Locomotion Ambulation   Ambulation assist      Assist level: Maximal Assistance - Patient 25 - 49% Assistive device: Walker-rolling(LUE orthosis) Max distance: 15'   Walk 10  feet activity   Assist     Assist level: Maximal Assistance - Patient 25 - 49% Assistive device: Walker-rolling   Walk 50 feet activity   Assist Walk 50 feet with 2 turns activity did not occur: Safety/medical concerns         Walk 150 feet activity   Assist Walk 150 feet activity did not occur: Safety/medical concerns         Walk 10 feet on uneven surface  activity   Assist Walk 10 feet on uneven surfaces activity did not occur: Safety/medical concerns         Wheelchair     Assist Will patient use wheelchair at discharge?: (TBD) Type of  Wheelchair: Manual    Wheelchair assist level: Supervision/Verbal cueing Max wheelchair distance: 150'    Wheelchair 50 feet with 2 turns activity    Assist        Assist Level: Supervision/Verbal cueing   Wheelchair 150 feet activity     Assist     Assist Level: Supervision/Verbal cueing    Medical Problem List and Plan: 1.   Left side weakness with dysarthria/dysphagia secondary to lacunar infarction in the posterior limb right internal capsule             -CIR PT, OT,  SLP 2.  DVT Prophylaxis/Anticoagulation: subcutaneous Lovenox. Monitor for any bleeding episodes 3. Pain Management: Neurontin 600 mg 3 times a day,tramadol as needed             -support while bed/chair to reduce sublux 4. Mood:  Provide emotional support 5. Neuropsych: This patient is capable of making decisions on his own behalf. 6. Skin/Wound Care:  Routine skin checks 7. Fluids/Electrolytes/Nutrition:  Routine in and out's with follow-up chemistries 8. Dysphagia. Dysphagia #3 nectar thick liquids. Follow-up speech therapy, advance as tolerated 9. Diabetes mellitus with peripheral neuropathy. Hemoglobin A1c 10.3. Glucotrol 10 mg daily, Levemir 12 units daily. Check blood sugars before meals and at bedtime. Diabetic teaching CBG (last 3)  Recent Labs    09/08/18 1630 09/08/18 2138 09/09/18 0643  GLUCAP 129* 134* 117*  fair control 09/09/2018 10. Hypertension. Lisinopril 40 mg daily. Vitals:   09/08/18 2110 09/09/18 0607  BP: (!) 170/88 140/72  Pulse: (!) 52 (!) 51  Resp: 18 16  Temp:  98.1 F (36.7 C)  SpO2:  100%  controlled 09/09/2018,bradycardia EKG 1/16 sinus  Brady , asymptomatic 11. Hyperlipidemia. Lipitor 12. Tobacco as well as polysubstance abuse. NicoDerm patch. Provide counseling     LOS: 3 days A FACE TO FACE EVALUATION WAS PERFORMED  Erick Colace 09/09/2018, 8:38 AM

## 2018-09-09 NOTE — Progress Notes (Signed)
Occupational Therapy Session Note  Patient Details  Name: Evan HANSMAN Sr. MRN: [id number] Date of Birth: 1956-07-25  Today's Date: 09/09/2018 OT Individual Time: 5170-0174 OT Individual Time Calculation (min): 75 min    Short Term Goals: Week 1:  OT Short Term Goal 1 (Week 1): Pt will perform UB dressing with min A in order to decrease level of assist with self care. OT Short Term Goal 2 (Week 1): Pt will perform shower transfer with mod A to decrease level of assist with self care.  OT Short Term Goal 3 (Week 1): Pt will demonstrate LB dressing with mod I utilizing hemiplegic dressing techniques.   Skilled Therapeutic Interventions/Progress Updates:    OT intervention with focus on bed mobility, BADL retraining (see below), LUE NMR (see below), sit<>stand, functional transfers, standing balance, and safety awareness to increase independence with BADLs. Pt requires min A for bed mobility, sit<>stand, and funcitonal transfers.  Pt educated on hemi bathing/dressing techniques. Pt requires min verbal cues for safety awareness.  Pt with trace LUE horizontal adduction and elbow flexion with gravity eliminated. Pt requires min multimodal cues for anterior pelvic tilt when sitting unsupported.  Pt remained seated in w/c with all needs within reach and belt alarm activated.   Therapy Documentation Precautions:  Precautions Precautions: Fall Restrictions Weight Bearing Restrictions: No Pain: Pain Assessment Pain Scale: 0-10 Pain Score: 0-No pain ADL: ADL Grooming: Minimal assistance Where Assessed-Grooming: Sitting at sink Upper Body Bathing: Minimal assistance Where Assessed-Upper Body Bathing: Shower Lower Body Bathing: Moderate assistance Where Assessed-Lower Body Bathing: Shower Upper Body Dressing: Minimal assistance Where Assessed-Upper Body Dressing: Sitting at sink, Wheelchair Lower Body Dressing: Moderate assistance Where Assessed-Lower Body Dressing: Sitting at sink,  Wheelchair, Standing at [employer]: Insurance underwriter Method: Warden/ranger: Grab bars, Transfer tub bench Other Treatments: Treatments Neuromuscular Facilitation: Left;Activity to increase coordination;Activity to increase motor control;Activity to increase timing and sequencing;Activity to increase grading;Activity to increase sustained activation;Activity to increase anterior-posterior weight shifting Weight Bearing Technique Weight Bearing Technique: Yes LUE Weight Bearing Technique: Forearm seated   Therapy/Group: Individual Therapy  Rich Brave 09/09/2018, 2:23 PM

## 2018-09-09 NOTE — Progress Notes (Signed)
Speech Language Pathology Daily Session Note  Patient Details  Name: Evan ROMACK Sr. MRN: [id number] Date of Birth: [birth year]  Today's Date: 09/09/2018 SLP Individual Time: 9983-3825 SLP Individual Time Calculation (min): 31 min  Short Term Goals: Week 1: SLP Short Term Goal 1 (Week 1): Patient will consume current diet with minimal overt s/s of aspiration with supervision verbal cues for use of swallowing compensatory strategies.  SLP Short Term Goal 2 (Week 1): Patient will consume trials of thin liquids via cup with minimal overt s/s of aspiration with supervision verbal cues over 2 sessions to assess readiness for repeat MBS.  SLP Short Term Goal 3 (Week 1): Patient will utilize speech intelligibility strategies at the phrase level with Mod A verbal cues to achieve ~90% intelligibility.  SLP Short Term Goal 4 (Week 1): Patient will self-monitor and correct errors during functional tasks with Min A verbal cues.  Skilled Therapeutic Interventions:  Pt was seen for skilled ST targeting goals for dysphagia and speech intelligibility.  SLP facilitated the session with trials of thin liquids to continue working towards diet progression.  With small controlled sips, pt demonstrated no overt s/s of aspiration with advanced liquids; however, when pt was challenged to take larger sips he demonstrated x1 immediate cough.  After trials, pt requested to eat his breakfast tray.  Pt consumed dys 3 textures and nectar thick liquids with supervision cues of use of swallowing precautions to monitor and correct anterior loss of boluses.  Throughout therapy session, pt required mod assist verbal cues to slow rate and increase vocal intensity to achieve intelligibility at the sentence level.  Pt was left in bed with bed alarm set and call bell within reach.  Continue per current plan of care.   Pain Pain Assessment Pain Scale: 0-10 Pain Score: 0-No pain  Therapy/Group: Individual Therapy  Brittannie Tawney, Melanee Spry 09/09/2018, 12:31 PM

## 2018-09-10 ENCOUNTER — Inpatient Hospital Stay (HOSPITAL_COMMUNITY): Payer: Medicaid Other

## 2018-09-10 ENCOUNTER — Inpatient Hospital Stay (HOSPITAL_COMMUNITY): Payer: Medicaid Other | Admitting: Physical Therapy

## 2018-09-10 ENCOUNTER — Inpatient Hospital Stay (HOSPITAL_COMMUNITY): Payer: Medicaid Other | Admitting: Speech Pathology

## 2018-09-10 DIAGNOSIS — Z794 Long term (current) use of insulin: Secondary | ICD-10-CM

## 2018-09-10 DIAGNOSIS — E785 Hyperlipidemia, unspecified: Secondary | ICD-10-CM

## 2018-09-10 LAB — GLUCOSE, CAPILLARY
Glucose-Capillary: 153 mg/dL — ABNORMAL HIGH (ref 70–99)
Glucose-Capillary: 64 mg/dL — ABNORMAL LOW (ref 70–99)
Glucose-Capillary: 91 mg/dL (ref 70–99)
Glucose-Capillary: 116 mg/dL — ABNORMAL HIGH (ref 70–99)
Glucose-Capillary: 169 mg/dL — ABNORMAL HIGH (ref 70–99)
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
## 2018-09-10 MED ORDER — SIMETHICONE 80 MG PO CHEW
80.0000 mg | CHEWABLE_TABLET | Freq: Four times a day (QID) | ORAL | Status: DC | PRN
  Administered 2018-09-11: 80 mg via ORAL
  Filled 2018-09-10: qty 1

## 2018-09-10 NOTE — Progress Notes (Signed)
Evan HEFLEY Sr. is a 63 y.o. male 09/16/1955 [id number]  Subjective: Reports physical therapy session today went well.  Feels tired but no specific complaints.  Denies pain but notes inability to burp and request Gas-X.  Denies nausea sensation, abdominal symptoms or change in dysphagia.  Objective: Vital signs in last 24 hours: Temp:  [97.5 F (36.4 C)-98.1 F (36.7 C)] 98.1 F (36.7 C) (01/25 1309) Pulse Rate:  [37-45] 45 (01/25 1309) Resp:  [16-19] 16 (01/25 1309) BP: (117-154)/(59-89) 117/69 (01/25 1309) SpO2:  [100 %] 100 % (01/25 1309) Weight change:  Last BM Date: 09/06/18  Intake/Output from previous day: 01/24 0701 - 01/25 0700 In: 438 [P.O.:438] Out: 850 [Urine:850]  Physical Exam General: No acute distress, supine in bedside recliner. Lungs: Clear to auscultation bilaterally without increased work of breathing. Cardiovascular: Regular rate and rhythm, no edema Neurologic.  Left hemiparesis with left facial droop and dysarthria, unchanged.  Lab Results: BMET    Component Value Date/Time   NA 140 09/07/2018 0641   NA 144 02/10/2018 1906   NA 135 (L) 03/15/2013 1649   K 4.4 09/07/2018 0641   K 4.3 03/15/2013 1649   CL 112 (H) 09/07/2018 0641   CL 97 (L) 03/15/2013 1649   CO2 21 (L) 09/07/2018 0641   CO2 30 03/15/2013 1649   GLUCOSE 161 (H) 09/07/2018 0641   GLUCOSE 372 (H) 03/15/2013 1649   BUN 37 (H) 09/07/2018 0641   BUN 16 02/10/2018 1906   BUN 21 (H) 03/15/2013 1649   CREATININE 1.39 (H) 09/07/2018 0641   CREATININE 1.30 03/15/2013 1649   CALCIUM 9.0 09/07/2018 0641   CALCIUM 9.8 03/15/2013 1649   GFRNONAA 54 (L) 09/07/2018 0641   GFRNONAA >60 03/15/2013 1649   GFRAA >60 09/07/2018 0641   GFRAA >60 03/15/2013 1649   CBC    Component Value Date/Time   WBC 4.6 09/07/2018 0641   RBC 4.57 09/07/2018 0641   HGB 13.1 09/07/2018 0641   HGB 15.6 03/15/2013 1649   HCT 41.0 09/07/2018 0641   HCT 45.7 03/15/2013 1649   PLT 237 09/07/2018 0641   PLT 188 03/15/2013 1649   MCV 89.7 09/07/2018 0641   MCV 88 03/15/2013 1649   MCH 28.7 09/07/2018 0641   MCHC 32.0 09/07/2018 0641   RDW 14.0 09/07/2018 0641   RDW 14.5 03/15/2013 1649   LYMPHSABS 1.9 09/07/2018 0641   LYMPHSABS 2.7 03/15/2013 1649   MONOABS 0.4 09/07/2018 0641   MONOABS 0.5 03/15/2013 1649   EOSABS 0.1 09/07/2018 0641   EOSABS 0.1 03/15/2013 1649   BASOSABS 0.0 09/07/2018 0641   BASOSABS 0.1 03/15/2013 1649   CBG's (last 3):   Recent Labs    09/10/18 0637 09/10/18 1151 09/10/18 1232  GLUCAP 153* 64* 91   LFT's Lab Results  Component Value Date   ALT 107 (H) 09/07/2018   AST 58 (H) 09/07/2018   ALKPHOS 120 09/07/2018   BILITOT 0.3 09/07/2018    Studies/Results: No results found.  Medications:  I have reviewed the patient's current medications. Scheduled Medications: . aspirin EC  81 mg Oral Daily  . atorvastatin  80 mg Oral QPM  . clopidogrel  75 mg Oral Daily  . enoxaparin (LOVENOX) injection  40 mg Subcutaneous Q24H  . gabapentin  600 mg Oral TID  . glipiZIDE  10 mg Oral QAC breakfast  . insulin aspart  0-15 Units Subcutaneous TID WC  . insulin detemir  12 Units Subcutaneous Daily  . lisinopril  40  mg Oral Daily  . mouth rinse  15 mL Mouth Rinse BID  . nicotine  14 mg Transdermal Daily   PRN Medications: acetaminophen **OR** acetaminophen (TYLENOL) oral liquid 160 mg/5 mL **OR** acetaminophen, polyvinyl alcohol, RESOURCE THICKENUP CLEAR, sorbitol, traMADol, traZODone  Assessment/Plan: Principal Problem:   Lacunar infarct, acute (HCC) Active Problems:   Essential hypertension   Diabetes (HCC)   Hyperlipidemia  1.  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  Continue inpatient rehab therapies with speech therapy, physical therapy and Occupational Therapy as ongoing.  Continue also medical management for comorbidities and supportive care. 2. Type 2 diabetes with  hyperglycemia.  A1c 10.3 prior to admission.  Continue carb modified diet with medication therapies plus sliding scale insulin as ordered. 3. Hypertension.  Continue medical therapy with titration as needed 4.  Dyslipidemia.  Continue statin. 5.  Tobacco abuse.  Continue nicotine patch with counseling as ongoing  Length of stay, days: 4  Evan Townsend A. Felicity Coyer, MD 09/10/2018, 1:52 PM

## 2018-09-10 NOTE — Progress Notes (Signed)
Patient remains bradycardia 40's asymptomatic,, please review  Vs documentation ,

## 2018-09-10 NOTE — Progress Notes (Signed)
Occupational Therapy Session Note  Patient Details  Name: Evan Townsend Sr. MRN: [id number] Date of Birth: [birth year]  Today's Date: 09/10/2018 OT Individual Time: 5176-1607 Session 2: 3710-6269 OT Individual Time Calculation (min): 55 min Session 2: 20 min   Short Term Goals: Week 1:  OT Short Term Goal 1 (Week 1): Pt will perform UB dressing with min A in order to decrease level of assist with self care. OT Short Term Goal 2 (Week 1): Pt will perform shower transfer with mod A to decrease level of assist with self care.  OT Short Term Goal 3 (Week 1): Pt will demonstrate LB dressing with mod I utilizing hemiplegic dressing techniques.   Skilled Therapeutic Interventions/Progress Updates:    Pt easily awoken with vc. No c/o pain, pt eager to eat breakfast. Min assist to appropriately position pt in bed to minimize aspiration risk during eating. Full supervision provided during self feeding, including intermittent cueing for bite size and pacing. Pt transitioned to EOB with mod A with cueing for L LE and UE positioning. Pt completed SPT to w/c > TTB with min-mod A. Buckling of LLE when transferring toward L side. Pt required mod A to doff clothing sit <> stand in shower. Pt completed UB bathing with min A, mod A for LB bathing. Pt cued for hemi dressing technique and requiring min A to don shirt. Mod A to don pants. Pt passed off to PT in room.   Session 2: Session focused on LUE NMR. Pt was received supine with no c/o pain eager for therapy. Pt was transported down to therapy gym where he sat in front of high table. His L UE was positioned on a towel on the table to facilitate gravity eliminated movement. Pt was cued for visual attention and muscle activation as his L UE was facilitated through elbow extension/flexion, shoulder horizontal abduction/adduction, flex/ext, and scapular retraction/protraction. Trace movement felt in shoulder movement and elbow flex. Pt returned to room and left supine  with all needs met, bed alarm set.   Therapy Documentation Precautions:  Precautions Precautions: Fall Restrictions Weight Bearing Restrictions: No Vital Signs: Therapy Vitals Temp: (!) 97.5 F (36.4 C) Temp Source: Oral Pulse Rate: (!) 43 Resp: 18 BP: (!) 123/59 Patient Position (if appropriate): Lying Oxygen Therapy SpO2: 100 % O2 Device: Room Air   Therapy/Group: Individual Therapy  Curtis Sites 09/10/2018, 7:26 AM

## 2018-09-10 NOTE — Significant Event (Signed)
Hypoglycemic Event  CBG: 64  Treatment: 4 oz juice/soda  Symptoms: None  Follow-up CBG: Time:1232 CBG Result:91  Possible Reasons for Event: Unknown  Comments/MD notified:Patients lunch arrived and also given 4oz. Thickened Sprite. Patient did not report any symptoms.    Marlyne Beards, Suzi Roots

## 2018-09-10 NOTE — Progress Notes (Signed)
Physical Therapy Session Note  Patient Details  Name: Evan NICKLAUS Sr. MRN: [id number] Date of Birth: 11-26-1955  Today's Date: 09/10/2018 PT Individual Time: 9767-3419 PT Individual Time Calculation (min): 72 min   Short Term Goals: Week 1:  PT Short Term Goal 1 (Week 1): Pt will transfer bed<>chair w/ min assist consistently PT Short Term Goal 2 (Week 1): Pt will perform bed mobility w/ min assist PT Short Term Goal 3 (Week 1): Pt will maintain midline oritentation w/ functional mobility 100% of the time w/ min cues   Skilled Therapeutic Interventions/Progress Updates:  Pt was seen bedside in the am finishing up with OT. Pt propelled w/c to rehab gym with R UE and LE and S about 150 feet. In gym, pt performed multiple sit to stand transfers with c/g and verbals cues as well as stand pivot transfers with min A and verbal cues. Pt performed block practice sit to stand transfers 3 sets x 5 reps each. Pt ambulated 25 feet x 2 with hemiwalker and mod A with verbal cues. Pt demonstrated improved ability to advance L LE and improved control L Knee with verbal cues for sequencing. Pt ambulated 5 feet with rolling walker and max A with verbal cues. Pt propelled w/c back to room with S and occasional verbal cues. Pt transferred w/c to recliner with mod A and verbal cues at end of treatment. Pt left sitting up in recliner with seat belt alarm in place and call bell within reach.    Therapy Documentation Precautions:  Precautions Precautions: Fall Restrictions Weight Bearing Restrictions: No General:   Pain: Pain Assessment Pain Scale: 0-10 Pain Score: 0-No pain  Therapy/Group: Individual Therapy  Rayford Halsted 09/10/2018, 12:13 PM

## 2018-09-10 NOTE — Progress Notes (Signed)
Speech Language Pathology Daily Session Note  Patient Details  Name: Evan Townsend Sr. MRN: [id number] Date of Birth: 07/10/1956  Today's Date: 09/10/2018 SLP Individual Time: 1115-1200 SLP Individual Time Calculation (min): 45 min  Short Term Goals: Week 1: SLP Short Term Goal 1 (Week 1): Patient will consume current diet with minimal overt s/s of aspiration with supervision verbal cues for use of swallowing compensatory strategies.  SLP Short Term Goal 2 (Week 1): Patient will consume trials of thin liquids via cup with minimal overt s/s of aspiration with supervision verbal cues over 2 sessions to assess readiness for repeat MBS.  SLP Short Term Goal 3 (Week 1): Patient will utilize speech intelligibility strategies at the phrase level with Mod A verbal cues to achieve ~90% intelligibility.  SLP Short Term Goal 4 (Week 1): Patient will self-monitor and correct errors during functional tasks with Min A verbal cues.  Skilled Therapeutic Interventions:  Skilled treatment session focused on dysphagia and communication goals. SLP received pt and his brother in unit's dayroom. SLP facilitated session by providing skilled observation of pt consuming thin liquids. Pt with cough x 1 throughout consumption of 6 oz. Pt with no anterior spillage during consumption but occasionally anterior of salvia noted during speech tasks. Pt able to achieve ~ 50% speech intelligibility at the sentence level with no evidence of self-awareness. Brother helpful in determining what pt had stated. Brother and pt with questions regarding prognosis for improvement with swallow as well as how to contain salvia. Education and strategies provided. Pt left upright in wheelchair with brother, lap alarm on and all needs at hand. Continue per current plan of care.   Pain Pain Assessment Pain Scale: 0-10 Pain Score: 0-No pain  Therapy/Group: Individual Therapy  Milisa Kimbell 09/10/2018, 1:19 PM

## 2018-09-11 LAB — GLUCOSE, CAPILLARY
Glucose-Capillary: 76 mg/dL (ref 70–99)
Glucose-Capillary: 155 mg/dL — ABNORMAL HIGH (ref 70–99)
Glucose-Capillary: 114 mg/dL — ABNORMAL HIGH (ref 70–99)
Glucose-Capillary: 159 mg/dL — ABNORMAL HIGH (ref 70–99)

## 2018-09-11 NOTE — Progress Notes (Signed)
Evan AnoDuncan E Davidoff Sr. is a 63 y.o. male [birth year] [id number]  Subjective: Feels well. Has not yet tried GasX but would like to do so for "gas" feeling in chest after eating. No pain or new problems reported  Objective: Vital signs in last 24 hours: Temp:  [97.3 F (36.3 C)-98.1 F (36.7 C)] 97.3 F (36.3 C) (01/26 0539) Pulse Rate:  [43-48] 43 (01/26 0539) Resp:  [14-16] 16 (01/26 0539) BP: (117-159)/(69-80) 138/70 (01/26 0539) SpO2:  [100 %] 100 % (01/26 0539) Weight change:  Last BM Date: 09/06/18  Intake/Output from previous day: 01/25 0701 - 01/26 0700 In: 980 [P.O.:980] Out: 400 [Urine:400]  Physical Exam General: No acute distress, supine in bed. Lungs: Clear to auscultation bilaterally without increased work of breathing. Cardiovascular: Regular rate and rhythm, no edema Neurologic.  Left hemiparesis with left facial droop and dysarthria, unchanged.  Lab Results: BMET    Component Value Date/Time   NA 140 09/07/2018 0641   NA 144 02/10/2018 1906   NA 135 (L) 03/15/2013 1649   K 4.4 09/07/2018 0641   K 4.3 03/15/2013 1649   CL 112 (H) 09/07/2018 0641   CL 97 (L) 03/15/2013 1649   CO2 21 (L) 09/07/2018 0641   CO2 30 03/15/2013 1649   GLUCOSE 161 (H) 09/07/2018 0641   GLUCOSE 372 (H) 03/15/2013 1649   BUN 37 (H) 09/07/2018 0641   BUN 16 02/10/2018 1906   BUN 21 (H) 03/15/2013 1649   CREATININE 1.39 (H) 09/07/2018 0641   CREATININE 1.30 03/15/2013 1649   CALCIUM 9.0 09/07/2018 0641   CALCIUM 9.8 03/15/2013 1649   GFRNONAA 54 (L) 09/07/2018 0641   GFRNONAA >60 03/15/2013 1649   GFRAA >60 09/07/2018 0641   GFRAA >60 03/15/2013 1649   CBC    Component Value Date/Time   WBC 4.6 09/07/2018 0641   RBC 4.57 09/07/2018 0641   HGB 13.1 09/07/2018 0641   HGB 15.6 03/15/2013 1649   HCT 41.0 09/07/2018 0641   HCT 45.7 03/15/2013 1649   PLT 237 09/07/2018 0641   PLT 188 03/15/2013 1649   MCV 89.7 09/07/2018 0641   MCV 88 03/15/2013 1649   MCH 28.7 09/07/2018  0641   MCHC 32.0 09/07/2018 0641   RDW 14.0 09/07/2018 0641   RDW 14.5 03/15/2013 1649   LYMPHSABS 1.9 09/07/2018 0641   LYMPHSABS 2.7 03/15/2013 1649   MONOABS 0.4 09/07/2018 0641   MONOABS 0.5 03/15/2013 1649   EOSABS 0.1 09/07/2018 0641   EOSABS 0.1 03/15/2013 1649   BASOSABS 0.0 09/07/2018 0641   BASOSABS 0.1 03/15/2013 1649   CBG's (last 3):   Recent Labs    09/10/18 1622 09/10/18 2111 09/11/18 0655  GLUCAP 116* 169* 76   LFT's Lab Results  Component Value Date   ALT 107 (H) 09/07/2018   AST 58 (H) 09/07/2018   ALKPHOS 120 09/07/2018   BILITOT 0.3 09/07/2018    Studies/Results: No results found.  Medications:  I have reviewed the patient's current medications. Scheduled Medications: . aspirin EC  81 mg Oral Daily  . atorvastatin  80 mg Oral QPM  . clopidogrel  75 mg Oral Daily  . enoxaparin (LOVENOX) injection  40 mg Subcutaneous Q24H  . gabapentin  600 mg Oral TID  . glipiZIDE  10 mg Oral QAC breakfast  . insulin aspart  0-15 Units Subcutaneous TID WC  . insulin detemir  12 Units Subcutaneous Daily  . lisinopril  40 mg Oral Daily  . mouth rinse  15  mL Mouth Rinse BID  . nicotine  14 mg Transdermal Daily   PRN Medications: acetaminophen **OR** acetaminophen (TYLENOL) oral liquid 160 mg/5 mL **OR** acetaminophen, polyvinyl alcohol, RESOURCE THICKENUP CLEAR, simethicone, sorbitol, traMADol, traZODone  Assessment/Plan: Principal Problem:   Lacunar infarct, acute (HCC) Active Problems:   Essential hypertension   Diabetes (HCC)   Hyperlipidemia  1.  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  Continue inpatient rehab therapies with speech therapy, physical therapy and Occupational Therapy as ongoing.  Continue also medical management for comorbidities and supportive care. 2. Type 2 diabetes with hyperglycemia.  A1c 10.3 prior to admission.  Continue carb modified diet with  medication therapies plus sliding scale insulin as ordered. 3. Hypertension.  Continue medical therapy with titration as needed 4.  Dyslipidemia.  Continue statin. 5.  Tobacco abuse.  Continue nicotine patch with counseling as ongoing  Length of stay, days: 5  Kimmerly Lora A. Felicity Coyer, MD 09/11/2018, 10:57 AM

## 2018-09-12 ENCOUNTER — Inpatient Hospital Stay (HOSPITAL_COMMUNITY): Payer: Medicaid Other

## 2018-09-12 ENCOUNTER — Inpatient Hospital Stay (HOSPITAL_COMMUNITY): Payer: Medicaid Other | Admitting: Speech Pathology

## 2018-09-12 LAB — GLUCOSE, CAPILLARY
Glucose-Capillary: 114 mg/dL — ABNORMAL HIGH (ref 70–99)
Glucose-Capillary: 155 mg/dL — ABNORMAL HIGH (ref 70–99)
Glucose-Capillary: 148 mg/dL — ABNORMAL HIGH (ref 70–99)
Glucose-Capillary: 214 mg/dL — ABNORMAL HIGH (ref 70–99)

## 2018-09-12 NOTE — Progress Notes (Signed)
Speech Language Pathology Daily Session Note  Patient Details  Name: Evan SUCHANEK Sr. MRN: [id number] Date of Birth: 1956/05/20  Today's Date: 09/12/2018 SLP Individual Time: 0569-7948 SLP Individual Time Calculation (min): 55 min  Short Term Goals: Week 1: SLP Short Term Goal 1 (Week 1): Patient will consume current diet with minimal overt s/s of aspiration with supervision verbal cues for use of swallowing compensatory strategies.  SLP Short Term Goal 2 (Week 1): Patient will consume trials of thin liquids via cup with minimal overt s/s of aspiration with supervision verbal cues over 2 sessions to assess readiness for repeat MBS.  SLP Short Term Goal 3 (Week 1): Patient will utilize speech intelligibility strategies at the phrase level with Mod A verbal cues to achieve ~90% intelligibility.  SLP Short Term Goal 4 (Week 1): Patient will self-monitor and correct errors during functional tasks with Min A verbal cues.  Skilled Therapeutic Interventions: Skilled treatment session focused on dysphagia and speech goals. Upon arrival, patient consuming his breakfast meal of Dys. 3 textures with nectar-thick liquids. Patient consuming a dry, chocolate chip cookie (suspect from last night's tray) and demonstrated efficient but mildly prolonged mastication without overt s/s of aspiration. Mild oral residue cleared with a liquid wash. No overt s/s of aspiration noted with nectar-thick liquids and patient self-monitored and corrected left anterior spillage with Mod I. SLP also facilitated session by providing Mod-Max A verbal cues for use of speech intelligibility strategies at the phrase and sentence level during a verbal description task to achieve ~75% intelligibility. Patient transferred to recliner at end of session and left with alarm on and all needs within reach. Continue with current plan of care.      Pain No/Denies Pain   Therapy/Group: Individual Therapy  Evan Townsend 09/12/2018, 10:51  AM

## 2018-09-12 NOTE — Progress Notes (Signed)
Physical Therapy Session Note  Patient Details  Name: Evan GOLIA Sr. MRN: [id number] Date of Birth: 07/26/1956  Today's Date: 09/12/2018 PT Individual Time: 1025-1135 PT Individual Time Calculation (min): 70 min   Short Term Goals: Week 1:  PT Short Term Goal 1 (Week 1): Pt will transfer bed<>chair w/ min assist consistently PT Short Term Goal 2 (Week 1): Pt will perform bed mobility w/ min assist PT Short Term Goal 3 (Week 1): Pt will maintain midline oritentation w/ functional mobility 100% of the time w/ min cues   Skilled Therapeutic Interventions/Progress Updates:    Pt seated in recliner upon PT arrival, agreeable to therapy tx and denies pain. Pt transferred from recliner to the w/c with min assist stand pivot. Pt transported to the gym. Pt performed stand pivot to mat with min assist, verbal cues for techniques. Pt performed 2 x 10 sit<>stands from edge of mat without UE support emphasis on symmetric LE weightbearing, pt performed 2 x 10 sit<>stands with 2 inch step under R LE for increased L LE weightbearing. Pt worked on L LE stance control to perform stepping in place with R LE 2 x 10 steps without UE support, verbal cues for techniques. Pt worked on L LE weightbearing and dynamic standing balance while standing in a staggered stance with R LE forward x 2 trials, min assist for balance. Pt performed side stepping in place with each LE x 10 each side, no UE support. Pt ambulated x 91 ft this session with RW, L UE orthosis and L shoe cover, min-mod assist with verbal cues for step length and foot placement, increased assist needed during turn to sit. Pt transferred to tall kneeling position on mat with mod assist. In tall kneeling pt worked on postural control during reaching task without UE support, pt also maintained this position for hip strengthening with tactile/verbal cues for hip extension. Pt transferred to modified quadruped on elbows for neuro re-ed, therapist stabilizing L side  while pt performed x 10 reaches with R arm and then x 10 hip extensions with R LE for increased L sided weightbearing. Pt transferred to sitting with min assist. Pt transferred to w/c with mod assist and transported to the gym. Pt used nustep this session x 5 minutes using B LEs only for strengthening and neuro re-ed on resistance level 5. Pt transported back to room and performed stand pivot to recliner with min assist. Pt left in recliner with needs in reach and chair alarm set.   Therapy Documentation Precautions:  Precautions Precautions: Fall Restrictions Weight Bearing Restrictions: No   Therapy/Group: Individual Therapy  Cresenciano Genre, PT, DPT 09/12/2018, 7:51 AM

## 2018-09-12 NOTE — Progress Notes (Signed)
[place] PHYSICAL MEDICINE & REHABILITATION PROGRESS NOTE   Subjective/Complaints:  No issues ovewrnite, asking to go home to pay bills but states wife can do that Working on speech intell   ROS- neg CP, SOB, N/V/D  Objective:   No results found. No results for input(s): WBC, HGB, HCT, PLT in the last 72 hours. No results for input(s): NA, K, CL, CO2, GLUCOSE, BUN, CREATININE, CALCIUM in the last 72 hours.  Intake/Output Summary (Last 24 hours) at 09/12/2018 0836 Last data filed at 09/12/2018 0056 Gross per 24 hour  Intake 600 ml  Output 500 ml  Net 100 ml     Physical Exam: Vital Signs Blood pressure (!) 141/71, pulse (!) 54, temperature (!) 97.4 F (36.3 C), resp. rate 18, height 5\' 6"  (1.676 m), weight 68.1 kg, SpO2 100 %.   General: No acute distress Mood and affect are appropriate Heart: bradycardia , normal rhythm no rubs murmurs or extra sounds Lungs: Clear to auscultation, breathing unlabored, no rales or wheezes Abdomen: Positive bowel sounds, soft nontender to palpation, nondistended Extremities: No clubbing, cyanosis, or edema Skin: No evidence of breakdown, no evidence of rash Neurologic: Cranial nerves II through XII intact, motor strength is 5/5 in RIght deltoid, bicep, tricep, grip, hip flexor, knee extensors, ankle dorsiflexor and plantar flexor, 0/5 LUE, 3- LLE Quad and hip add, 2-/5 Left ankle DF/PF, increase tone  Sensory exam normal sensation to light touch and proprioception in bilateral upper and lower extremities Cerebellar exam normal unable to do finger to nose to finger left upper     Musculoskeletal: Full range of motion in all 4 extremities. No joint swelling   Assessment/Plan: 1. 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  Physiatrist is providing close team supervision and 24 hour management of active medical  problems listed below.  Physiatrist and rehab team continue to assess barriers to discharge/monitor patient progress toward functional and medical goals  Care Tool:  Bathing    Body parts bathed by patient: Left arm, Chest, Abdomen, Front perineal area, Right upper leg, Left upper leg, Face   Body parts bathed by helper: Right arm, Buttocks, Right lower leg, Left lower leg     Bathing assist Assist Level: Moderate Assistance - Patient 50 - 74%     Upper Body Dressing/Undressing Upper body dressing   What is the patient wearing?: Pull over shirt    Upper body assist Assist Level: Minimal Assistance - Patient > 75%    Lower Body Dressing/Undressing Lower body dressing      What is the patient wearing?: Underwear/pull up, Pants     Lower body assist Assist for lower body dressing: Moderate Assistance - Patient 50 - 74%     Toileting Toileting    Toileting assist Assist for toileting: Maximal Assistance - Patient 25 - 49%     Transfers Chair/bed transfer  Transfers assist     Chair/bed transfer assist level: Moderate Assistance - Patient 50 - 74%     Locomotion Ambulation   Ambulation assist      Assist level: Moderate Assistance - Patient 50 - 74% Assistive device: Walker-hemi Max distance: 25   Walk 10 feet activity   Assist     Assist level: Moderate Assistance - Patient - 50 - 74% Assistive device: Walker-hemi   Walk 50 feet activity   Assist Walk 50 feet with 2 turns activity  did not occur: Safety/medical concerns         Walk 150 feet activity   Assist Walk 150 feet activity did not occur: Safety/medical concerns         Walk 10 feet on uneven surface  activity   Assist Walk 10 feet on uneven surfaces activity did not occur: Safety/medical concerns         Wheelchair     Assist Will patient use wheelchair at discharge?: (TBD) Type of Wheelchair: Manual    Wheelchair assist level: Supervision/Verbal cueing Max  wheelchair distance: 150    Wheelchair 50 feet with 2 turns activity    Assist        Assist Level: Supervision/Verbal cueing   Wheelchair 150 feet activity     Assist     Assist Level: Supervision/Verbal cueing    Medical Problem List and Plan: 1.   Left side weakness with dysarthria/dysphagia secondary to lacunar infarction in the posterior limb right internal capsule             -CIR PT, OT,  SLP 2.  DVT Prophylaxis/Anticoagulation: subcutaneous Lovenox. Monitor for any bleeding episodes 3. Pain Management: Neurontin 600 mg 3 times a day,tramadol as needed             -support while bed/chair to reduce sublux 4. Mood:  Provide emotional support 5. Neuropsych: This patient is capable of making decisions on his own behalf. 6. Skin/Wound Care:  Routine skin checks 7. Fluids/Electrolytes/Nutrition:  Routine in and out's with follow-up chemistries 8. Dysphagia. Dysphagia #3 nectar thick liquids. Follow-up speech therapy, advance as tolerated 9. Diabetes mellitus with peripheral neuropathy. Hemoglobin A1c 10.3. Glucotrol 10 mg daily, Levemir 12 units daily. Check blood sugars before meals and at bedtime. Diabetic teaching CBG (last 3)  Recent Labs    09/11/18 1642 09/11/18 2107 09/12/18 0634  GLUCAP 114* 159* 114*  good control 09/12/2018 10. Hypertension. Lisinopril 40 mg daily. Vitals:   09/11/18 2012 09/12/18 0521  BP: (!) 163/76 (!) 141/71  Pulse: (!) 56 (!) 54  Resp: 17 18  Temp: (!) 97.4 F (36.3 C) (!) 97.4 F (36.3 C)  SpO2: 100% 100%  controlled 09/12/2018,bradycardia EKG 1/16 sinus  Brady , asymptomatic 11. Hyperlipidemia. Lipitor 12. Tobacco as well as polysubstance abuse. NicoDerm patch. Provide counseling     LOS: 6 days A FACE TO FACE EVALUATION WAS PERFORMED  Evan Townsend  09/12/2018, 8:36 AM

## 2018-09-12 NOTE — Progress Notes (Signed)
Occupational Therapy Session Note  Patient Details  Name: EMREY MANISCALCO Sr. MRN: [id number] Date of Birth: 06/19/1956  Today's Date: 09/12/2018 OT Individual Time: 0865-7846 OT Individual Time Calculation (min): 75 min    Short Term Goals: Week 1:  OT Short Term Goal 1 (Week 1): Pt will perform UB dressing with min A in order to decrease level of assist with self care. OT Short Term Goal 2 (Week 1): Pt will perform shower transfer with mod A to decrease level of assist with self care.  OT Short Term Goal 3 (Week 1): Pt will demonstrate LB dressing with mod I utilizing hemiplegic dressing techniques.   Skilled Therapeutic Interventions/Progress Updates:    OT intervention with focus on bed mobility, sitting balance, sit<>stand, standing balance, functional transfers, BADL retraining, and safety awareness to increase independence with BADLs.  See below for assist levels with BADLs.  Pt requires mod verbal cues for safety awareness with sit<>stand and functional transfers.  Pt requires min verbal cues to incorporate hemi dressing techniques and sequencing with sit<>stand.  Pt with trace shoulder activation and elbow flexion. Pt remained in w/c with all needs within reach and belt alarm activated.   Therapy Documentation Precautions:  Precautions Precautions: Fall Restrictions Weight Bearing Restrictions: No Pain:  Pt denies pain ADL: ADL Grooming: Contact guard Where Assessed-Grooming: Sitting at sink Upper Body Bathing: Minimal assistance Where Assessed-Upper Body Bathing: Shower Lower Body Bathing: Minimal assistance Where Assessed-Lower Body Bathing: Shower Upper Body Dressing: Minimal assistance Where Assessed-Upper Body Dressing: Sitting at sink, Wheelchair Lower Body Dressing: Moderate assistance Where Assessed-Lower Body Dressing: Sitting at sink, Wheelchair, Standing at sink [employer]: Insurance underwriter Method: Loss adjuster, chartered: Grab bars, Transfer tub bench   Therapy/Group: Individual Therapy  Rich Brave 09/12/2018, 8:42 AM

## 2018-09-13 ENCOUNTER — Inpatient Hospital Stay (HOSPITAL_COMMUNITY): Payer: Medicaid Other

## 2018-09-13 ENCOUNTER — Inpatient Hospital Stay (HOSPITAL_COMMUNITY): Payer: Self-pay

## 2018-09-13 ENCOUNTER — Encounter (HOSPITAL_COMMUNITY): Payer: Medicaid Other | Admitting: Psychology

## 2018-09-13 ENCOUNTER — Inpatient Hospital Stay (HOSPITAL_COMMUNITY): Payer: Medicaid Other | Admitting: Speech Pathology

## 2018-09-13 LAB — GLUCOSE, CAPILLARY
Glucose-Capillary: 107 mg/dL — ABNORMAL HIGH (ref 70–99)
Glucose-Capillary: 157 mg/dL — ABNORMAL HIGH (ref 70–99)
Glucose-Capillary: 90 mg/dL (ref 70–99)

## 2018-09-13 LAB — CREATININE, SERUM
Creatinine, Ser: 1.25 mg/dL — ABNORMAL HIGH (ref 0.61–1.24)
GFR calc Af Amer: >60 mL/min (ref >60)
GFR calc non Af Amer: >60 mL/min (ref >60)

## 2018-09-13 NOTE — Plan of Care (Signed)
  Problem: Consults Goal: RH STROKE PATIENT EDUCATION Description See Patient Education module for education specifics  Outcome: Progressing Goal: Diabetes Guidelines if Diabetic/Glucose > 140 Description If diabetic or lab glucose is > 140 mg/dl - Initiate Diabetes/Hyperglycemia Guidelines & Document Interventions  Outcome: Progressing   Problem: RH BOWEL ELIMINATION Goal: RH STG MANAGE BOWEL WITH ASSISTANCE Description STG Manage Bowel with min Assistance.  Outcome: Progressing   Problem: RH BLADDER ELIMINATION Goal: RH STG MANAGE BLADDER WITH ASSISTANCE Description STG Manage Bladder With min Assistance  Outcome: Progressing   Problem: RH SKIN INTEGRITY Goal: RH STG MAINTAIN SKIN INTEGRITY WITH ASSISTANCE Description STG Maintain Skin Integrity With min Assistance.  Outcome: Progressing   Problem: RH SAFETY Goal: RH STG ADHERE TO SAFETY PRECAUTIONS W/ASSISTANCE/DEVICE Description STG Adhere to Safety Precautions With min Assistance and appropriate assistive Device.  Outcome: Progressing   Problem: RH PAIN MANAGEMENT Goal: RH STG PAIN MANAGED AT OR BELOW PT'S PAIN GOAL Description <3 on a 0-10 pain scale  Outcome: Progressing   Problem: RH KNOWLEDGE DEFICIT Goal: RH STG INCREASE KNOWLEDGE OF DIABETES Description Patient will demonstrate knowledge of diabetes medications, dietary restrictions, and how to manage hypo/hyperglycemic episodes. Follow up care with the doctor post discharge with min assist from staff.  Outcome: Progressing Goal: RH STG INCREASE KNOWLEDGE OF HYPERTENSION Description Patient will demonstrate knowledge of HTN medications, dietary restrictions, BP parameters, and follow up care with the MD post discharge with min assist from staff.  Outcome: Progressing Goal: RH STG INCREASE KNOWLEDGE OF DYSPHAGIA/FLUID INTAKE Description Patient will demonstrate knowledge of proper swallowing techniques, managing fluid intake with thickened fluids, and  follow up care with the MD post discharge with min assist from staff.  Outcome: Progressing Goal: RH STG INCREASE KNOWLEGDE OF HYPERLIPIDEMIA Description Patient will demonstrate knowledge of HLD medications, dietary restrictions, and follow up care with the MD post discharge with min assist from staff.  Outcome: Progressing Goal: RH STG INCREASE KNOWLEDGE OF STROKE PROPHYLAXIS Description Patient will demonstrate knowledge of stroke prophylactic medications and how they are used to help prevent future strokes. Follow up care with the MD post discharge with min assist from staff.   Outcome: Progressing   

## 2018-09-13 NOTE — Progress Notes (Signed)
Physical Therapy Session Note  Patient Details  Name: Evan Townsend E Pomplun Sr. MRN: [id number] Date of Birth: 01/05/1956  Today's Date: 09/13/2018 PT Individual Time: 0900-0930 and 1415-1500 PT Individual Time Calculation (min): 30 min and 45 min   Short Term Goals: Week 1:  PT Short Term Goal 1 (Week 1): Pt will transfer bed<>chair w/ min assist consistently PT Short Term Goal 2 (Week 1): Pt will perform bed mobility w/ min assist PT Short Term Goal 3 (Week 1): Pt will maintain midline oritentation w/ functional mobility 100% of the time w/ min cues   Skilled Therapeutic Interventions/Progress Updates:    Session 1: Pt supine in bed upon PT arrival, agreeable to therapy tx and denies pain. Pt transferred to sitting EOB with supervision. Pt donned shoes and jacket with min assist. Pt requesting to go outside this session. Pt performed stand pivot to w/c with min assist, transported outside. Pt performed x 10 sit<>stands from w/c for strengthening, emphasis on symmetric LE weightbearing. Pt worked on standing balance and pre-gait without AD while stepping in place x 10 steps per LE, min-mod assist. Pt propelled w/c back to unit x 200 ft with supervision using R hemi techniques. Pt ambulated x 50 ft with mod assist and RW, shoe cover for L toe clearance with verbal cues for sequencing. Pt left in w/c at end of session with chair alarm set and needs in reach.   Session 2: Pt seated in w/c upon PT arrival, agreeable to therapy tx and denies pain. Pt transported to the gym. Pt participated in gait training this session with x 1 trial using a toe up brace and shoe cover, x 1 trial using AFO, pt also using RW for both of these and requiring mod assist, verbal cues for foot placement and L foot clearance. Pt with improved gait during trial use of AFO, will consult orthotist this week. Pt performed sit<>stands from mat x 10 working on LE strength and knee control. Pt worked on L LE stance control while  performing stepping in place with R LE x 10. Pt worked on Copydynamic balance tossing horseshoes while standing, x 2 trials. Pt transported back to room and left in w/c with needs in reach and chair alarm set.   Therapy Documentation Precautions:  Precautions Precautions: Fall Restrictions Weight Bearing Restrictions: No   Therapy/Group: Individual Therapy  Cresenciano GenreEmily van Schagen, PT, DPT 09/13/2018, 7:45 AM

## 2018-09-13 NOTE — Progress Notes (Signed)
Occupational Therapy Session Note  Patient Details  Name: Evan Townsend. MRN: [id number] Date of Birth: 01-Dec-1955  Today's Date: 09/13/2018 OT Individual Time: 7846-9629 OT Individual Time Calculation (min): 75 min    Short Term Goals: Week 1:  OT Short Term Goal 1 (Week 1): Pt will perform UB dressing with min A in order to decrease level of assist with self care. OT Short Term Goal 2 (Week 1): Pt will perform shower transfer with mod A to decrease level of assist with self care.  OT Short Term Goal 3 (Week 1): Pt will demonstrate LB dressing with mod I utilizing hemiplegic dressing techniques.   Skilled Therapeutic Interventions/Progress Updates:    OT intervention with focus on BADL retraining, sit<>stand, standing balance, functional transfers, LUE NMR, sitting balance, activity tolerance, and safety awareness to increase independence with BADLs. Pt engaged in bathing tasks at shower level with min A for standing balance and min verbal cues for sequencing and LLE placement prior to sit<>stand. See below for BADL assistance levels.  Pt engaged in LUE NMR (see below) with improvement in scapula protraction/retraction and elbow flexion noted. Pt returned to room and remained in w/c with all needs within reach and belt alarm activated.  Therapy Documentation Precautions:  Precautions Precautions: Fall Restrictions Weight Bearing Restrictions: No   Pain:  Pt denies pain ADL: ADL Grooming: Contact guard Where Assessed-Grooming: Sitting at sink Upper Body Bathing: Minimal assistance Where Assessed-Upper Body Bathing: Shower Lower Body Bathing: Minimal assistance Where Assessed-Lower Body Bathing: Shower Upper Body Dressing: Minimal assistance Where Assessed-Upper Body: Sitting at sink, Wheelchair Lower Body Dressing: Moderate assistance Where Assessed-Lower Body Dressing: Sitting at sink, Wheelchair, Standing at [employer]: Doctor, general practice Method: Warden/ranger: Grab bars, Transfer tub bench   Other Treatments: Treatments Neuromuscular Facilitation: Left;Activity to increase coordination;Activity to increase motor control;Activity to increase timing and sequencing;Activity to increase grading;Activity to increase sustained activation;Activity to increase anterior-posterior weight shifting;Activity to increase lateral weight shifting Weight Bearing Technique LUE Weight Bearing Technique: Forearm seated;Extended arm seated   Therapy/Group: Individual Therapy  Rich Brave 09/13/2018, 10:50 AM

## 2018-09-13 NOTE — Progress Notes (Signed)
[place] PHYSICAL MEDICINE & REHABILITATION PROGRESS NOTE   Subjective/Complaints:  No issues overnite, looking forward to going outside with PT ROS- neg CP, SOB, N/V/D  Objective:   No results found. No results for input(s): WBC, HGB, HCT, PLT in the last 72 hours. Recent Labs    09/13/18 0529  CREATININE 1.25*    Intake/Output Summary (Last 24 hours) at 09/13/2018 0829 Last data filed at 09/12/2018 1700 Gross per 24 hour  Intake 476 ml  Output -  Net 476 ml     Physical Exam: Vital Signs Blood pressure 117/71, pulse (!) 54, temperature 97.7 F (36.5 C), temperature source Oral, resp. rate 14, height 5\' 6"  (1.676 m), weight 68.1 kg, SpO2 100 %.   General: No acute distress Mood and affect are appropriate Heart: bradycardia , normal rhythm no rubs murmurs or extra sounds Lungs: Clear to auscultation, breathing unlabored, no rales or wheezes Abdomen: Positive bowel sounds, soft nontender to palpation, nondistended Extremities: No clubbing, cyanosis, or edema Skin: No evidence of breakdown, no evidence of rash Neurologic: Cranial nerves II through XII intact, motor strength is 5/5 in RIght deltoid, bicep, tricep, grip, hip flexor, knee extensors, ankle dorsiflexor and plantar flexor, 0/5 LUE, 3- LLE Quad and hip add, 2-/5 Left ankle DF/PF, increase tone  Sensory exam normal sensation to light touch and proprioception in bilateral upper and lower extremities Cerebellar exam normal unable to do finger to nose to finger left upper     Musculoskeletal: Full range of motion in all 4 extremities. No joint swelling   Assessment/Plan: 1. 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  Physiatrist is providing close team supervision and 24 hour management of active medical problems listed below.  Physiatrist and rehab team continue to assess barriers  to discharge/monitor patient progress toward functional and medical goals  Care Tool:  Bathing    Body parts bathed by patient: Left arm, Chest, Abdomen, Front perineal area, Right upper leg, Left upper leg, Face, Right lower leg   Body parts bathed by helper: Left lower leg, Buttocks, Right arm     Bathing assist Assist Level: Moderate Assistance - Patient 50 - 74%     Upper Body Dressing/Undressing Upper body dressing   What is the patient wearing?: Pull over shirt    Upper body assist Assist Level: Minimal Assistance - Patient > 75%    Lower Body Dressing/Undressing Lower body dressing      What is the patient wearing?: Pants, Underwear/pull up     Lower body assist Assist for lower body dressing: Moderate Assistance - Patient 50 - 74%     Toileting Toileting    Toileting assist Assist for toileting: Maximal Assistance - Patient 25 - 49%     Transfers Chair/bed transfer  Transfers assist     Chair/bed transfer assist level: Moderate Assistance - Patient 50 - 74%     Locomotion Ambulation   Ambulation assist      Assist level: Moderate Assistance - Patient 50 - 74% Assistive device: Walker-rolling Max distance: 91 ft   Walk 10 feet activity   Assist     Assist level: Moderate Assistance - Patient - 50 - 74% Assistive device: Walker-rolling   Walk 50 feet activity   Assist Walk 50 feet with 2 turns activity did not occur: Safety/medical concerns  Assist level: Moderate Assistance - Patient - 50 - 74%  Assistive device: Walker-rolling    Walk 150 feet activity   Assist Walk 150 feet activity did not occur: Safety/medical concerns         Walk 10 feet on uneven surface  activity   Assist Walk 10 feet on uneven surfaces activity did not occur: Safety/medical concerns         Wheelchair     Assist Will patient use wheelchair at discharge?: (TBD) Type of Wheelchair: Manual    Wheelchair assist level: Supervision/Verbal  cueing Max wheelchair distance: 150    Wheelchair 50 feet with 2 turns activity    Assist        Assist Level: Supervision/Verbal cueing   Wheelchair 150 feet activity     Assist     Assist Level: Supervision/Verbal cueing    Medical Problem List and Plan: 1.   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  DVT Prophylaxis/Anticoagulation: subcutaneous Lovenox. Monitor for any bleeding episodes 3. Pain Management: Neurontin 600 mg 3 times a day,tramadol as needed             -support while bed/chair to reduce sublux 4. Mood:  Provide emotional support 5. Neuropsych: This patient is capable of making decisions on his own behalf. 6. Skin/Wound Care:  Routine skin checks 7. Fluids/Electrolytes/Nutrition:  Routine in and out's with follow-up chemistries 8. Dysphagia. Dysphagia #3 nectar thick liquids. Follow-up speech therapy, advance as tolerated 9. Diabetes mellitus with peripheral neuropathy. Hemoglobin A1c 10.3. Glucotrol 10 mg daily, Levemir 12 units daily. Check blood sugars before meals and at bedtime. Diabetic teaching CBG (last 3)  Recent Labs    09/12/18 1638 09/12/18 2109 09/13/18 0633  GLUCAP 148* 214* 107*  good control 09/13/2018 10. Hypertension. Lisinopril 40 mg daily. Vitals:   09/12/18 1941 09/13/18 0437  BP: (!) 152/79 117/71  Pulse: 61 (!) 54  Resp: 14 14  Temp: 99 F (37.2 C) 97.7 F (36.5 C)  SpO2: 100% 100%  controlled 09/13/2018, elevated systolic yest pm ,bradycardia EKG 1/16 sinus  Brady , asymptomatic 11. Hyperlipidemia. Lipitor 12. Tobacco as well as polysubstance abuse. NicoDerm patch. Provide counseling     LOS: 7 days A FACE TO FACE EVALUATION WAS PERFORMED  Erick Colacendrew E Khloey Chern 09/13/2018, 8:29 AM

## 2018-09-13 NOTE — Progress Notes (Signed)
Speech Language Pathology Daily Session Note  Patient Details  Name: UKIAH SUIRE Sr. MRN: [id number] Date of Birth: 1956-05-01  Today's Date: 09/13/2018 SLP Individual Time: 3748-2707 SLP Individual Time Calculation (min): 40 min  Short Term Goals: Week 1: SLP Short Term Goal 1 (Week 1): Patient will consume current diet with minimal overt s/s of aspiration with supervision verbal cues for use of swallowing compensatory strategies.  SLP Short Term Goal 2 (Week 1): Patient will consume trials of thin liquids via cup with minimal overt s/s of aspiration with supervision verbal cues over 2 sessions to assess readiness for repeat MBS.  SLP Short Term Goal 3 (Week 1): Patient will utilize speech intelligibility strategies at the phrase level with Mod A verbal cues to achieve ~90% intelligibility.  SLP Short Term Goal 4 (Week 1): Patient will self-monitor and correct errors during functional tasks with Min A verbal cues.  Skilled Therapeutic Interventions: Skilled treatment session focused on dysphagia and speech goals. SLP facilitated session by providing trials of thin liquids via cup after oral care. Patient consumed trials with overt s/s of aspiration, suspect due to large bolus size and poor oral control. Recommend MBS tomorrow prior to upgrade. SLP also facilitated session by providing Mod A verbal cues for use of speech intelligibility strategies at the phrase level to achieve ~75% intelligibility. Patient left upright in wheelchair with alarm on and all needs within reach. Continue with current plan of care.      Pain No/Denies Pain   Therapy/Group: Individual Therapy  Ouida Abeyta 09/13/2018, 2:58 PM

## 2018-09-13 NOTE — Progress Notes (Signed)
Occupational Therapy Weekly Progress Note  Patient Details  Name: Evan SAY Sr. MRN: [id number] Date of Birth: 1956/02/16  Beginning of progress report period: September 07, 2018 End of progress report period: September 13, 2018  Patient has met 2 of 3 short term goals. Pt is making steady progress with BADLs and functional transfers. Pt currently requires min A for bathing tasks and UB dressing tasks.  Pt requires mod A for LB dressing tasks.  Pt requires mod verbal cues for safety awareness and sequencing with sit<>stand and functional transfers.  Pt with trace scapula protraction/retraction and elbow flexion.  Patient continues to demonstrate the following deficits:L UE NMR, muscle weakness, decreased coordination and decreased motor planning and decreased sitting balance, decreased standing balance, hemiplegia and decreased balance strategies and therefore will continue to benefit from skilled OT intervention to enhance overall performance with BADL.  Patient progressing toward long term goals..  Continue plan of care.  OT Short Term Goals Week 1:  OT Short Term Goal 1 (Week 1): Pt will perform UB dressing with min A in order to decrease level of assist with self care. OT Short Term Goal 1 - Progress (Week 1): Met OT Short Term Goal 2 (Week 1): Pt will perform shower transfer with mod A to decrease level of assist with self care.  OT Short Term Goal 2 - Progress (Week 1): Met OT Short Term Goal 3 (Week 1): Pt will demonstrate LB dressing with mod I utilizing hemiplegic dressing techniques.  OT Short Term Goal 3 - Progress (Week 1): Discontinued (mod I STG established in error) Week 2:  OT Short Term Goal 1 (Week 2): Pt will perform toilet transfers with min A and min verbal cues for safety OT Short Term Goal 2 (Week 2): Pt will perform toileting tasks with mod A sit<>stand for clothing management OT Short Term Goal 3 (Week 2): Pt will don underpants and pant with min A OT Short Term Goal  4 (Week 2): Pt will don shoes and fasten with shoe buttrons with min A     Leroy Libman 09/13/2018, 10:55 AM

## 2018-09-14 ENCOUNTER — Inpatient Hospital Stay (HOSPITAL_COMMUNITY): Payer: Medicaid Other | Admitting: Physical Therapy

## 2018-09-14 ENCOUNTER — Inpatient Hospital Stay (HOSPITAL_COMMUNITY): Payer: Medicaid Other

## 2018-09-14 ENCOUNTER — Inpatient Hospital Stay (HOSPITAL_COMMUNITY): Payer: Self-pay

## 2018-09-14 ENCOUNTER — Encounter (HOSPITAL_COMMUNITY): Payer: Medicaid Other | Admitting: Speech Pathology

## 2018-09-14 LAB — GLUCOSE, CAPILLARY
Glucose-Capillary: 112 mg/dL — ABNORMAL HIGH (ref 70–99)
Glucose-Capillary: 137 mg/dL — ABNORMAL HIGH (ref 70–99)
Glucose-Capillary: 124 mg/dL — ABNORMAL HIGH (ref 70–99)
Glucose-Capillary: 169 mg/dL — ABNORMAL HIGH (ref 70–99)

## 2018-09-14 NOTE — Progress Notes (Signed)
Physical Therapy Session Note  Patient Details  Name: Evan WALRATH Sr. MRN: [id number] Date of Birth: Aug 21, 1955  Today's Date: 09/14/2018 PT Individual Time: 1330-1410 PT Individual Time Calculation (min): 40 min   Short Term Goals: Week 1:  PT Short Term Goal 1 (Week 1): Pt will transfer bed<>chair w/ min assist consistently PT Short Term Goal 1 - Progress (Week 1): Met PT Short Term Goal 2 (Week 1): Pt will perform bed mobility w/ min assist PT Short Term Goal 2 - Progress (Week 1): Met PT Short Term Goal 3 (Week 1): Pt will maintain midline oritentation w/ functional mobility 100% of the time w/ min cues  PT Short Term Goal 3 - Progress (Week 1): Met  Skilled Therapeutic Interventions/Progress Updates:  Patient received up in [facility], very pleasant and motivated to participate in therapy; AFO, shoes, and toe cover applied totalA, then worked on Personnel officer with maxA for balance, RW management, and progression of L LE/prevention of L LE buckling during gait, patient with very poor balance and reporting fatigue this afternoon, only gait trained 3f due to safety concerns this afternoon. [facility], where he was able to transfer to Nustep with MinA, tolerated riding Nustep for 12 minutes on resistance 3 with Nustep addition to keep LE in good alignment in place, [facility] Able to self-propel WC approximately 1531fwith hemi-technique and S, then worked on L LE NMR via toe taps forward to target with MinA, also practiced good knee alignment and preventing L knee hyperextension in stance with toe taps forward R LE. He was handed off to primary therapist in PT gym at end of session, all needs otherwise met this afternoon.   Therapy Documentation Precautions:  Precautions Precautions: Fall Restrictions Weight Bearing Restrictions: No General:   Vital Signs:  Pain: Pain Assessment Pain Scale: 0-10 Pain Score: 0-No  pain    Therapy/Group: Individual Therapy   KrDeniece ReeT, DPT, CBIS  Supplemental Physical Therapist [facility]  Pager [phone] [phone]  09/14/2018, 2:55 PM

## 2018-09-14 NOTE — Progress Notes (Signed)
Social Work Patient ID: Evan Cones Sr., male   DOB: 02-14-1956, 63 y.o.   MRN: [id number] Met with pt and spoke with his wife via telephone to discuss team conference goals supervision-min assist level and discharge 2/8. Both very pleased with this date and wanting to get back home. Discussed having wife come in for education prior to discharge, she can come in Friday 2/7 from 9:00-12:00 or may come in Thursday night and stay over to be here in the am. Will work on discharge needs for pt.

## 2018-09-14 NOTE — Patient Care Conference (Signed)
Inpatient RehabilitationTeam Conference and Plan of Care Update Date: 09/14/2018   Time: 11:30 AM    Patient Name: Evan FADLEY Sr.      Medical Record Number: [id number]  Date of Birth: 05/29/1956 Sex: Male         Room/Bed: 4W21C/4W21C-01 Payor Info: Payor: MEDICAID  / Plan: MEDICAID [place] [id number] / Product Type: *No Product type* /    Admitting Diagnosis: cva  Admit Date/Time:  09/06/2018  1:08 PM Admission Comments: No comment available   Primary Diagnosis:  Lacunar infarct, acute (HCC) Principal Problem: Lacunar infarct, acute [facility])  Patient Active Problem List   Diagnosis Date Noted  . Lacunar infarct, acute (HCC) 09/06/2018  . TIA (transient ischemic attack) 09/01/2018  . Left leg pain 01/13/2018  . CVA (cerebral vascular accident) (HCC) 09/10/2017  . Hyperlipidemia 02/11/2017  . Acute CVA (cerebrovascular accident) (HCC) 11/10/2016  . Essential hypertension 11/10/2016  . Diabetes (HCC) 11/10/2016    Expected Discharge Date: Expected Discharge Date: 09/24/18  Team Members Present: Physician leading conference: Dr. Claudette Laws Social Worker Present: Dossie Der, LCSW Nurse Present: Chana Bode, RN PT Present: Woodfin Ganja, PT OT Present: Roney Mans, OT;Ardis Rowan, COTA SLP Present: Feliberto Gottron, SLP PPS Coordinator present : Edson Snowball, PT     Current Status/Progress Goal Weekly Team Focus  Medical   Hypertension control improving, diabetic control improving  Reduce secondary stroke risk reduce fall risk  Continue rehabilitation program, order AFO   Bowel/Bladder   Pt is continent B/B. LBM 09/13/2018.  Remain continent B/B. Maintain regular bowel pattern.  Assist with toileting needs PRN.   Swallow/Nutrition/ Hydration   Dys. 3 textures with nectar-thick liquids, water protocol, Full Supervision   Mod I  tolerance of current diet, continued trials of thin    ADL's   bathing-min A; UB dressing-min A; LB dressing-mod A; functional  transfers-min/mod A; impulsive  min A/CGA overall  LUE NMR, BADL retraining, functional transfers, safety awareness, activity tolerance   Mobility   supervision bed mobility, min-mod assist transfers, mod assist gait with RW up to 90 ft  supervision w/c, CGA gait  L LE NMR, knee control, gait, transfers, balance   Communication   Mod A   Supervision  utilization of speech intelligibility strategies    Safety/Cognition/ Behavioral Observations  Supervision-MIn A  Mod I   awareness    Pain   No complaints of pain.  Remain pain free.  Assess pain Q shift and PRN.   Skin   No skin issues.  Maintain skin integrity and prevent skin breakdown.  Assess skin Q shift and PRN.      *See Care Plan and progress notes for long and short-term goals.     Barriers to Discharge  Current Status/Progress Possible Resolutions Date Resolved   Physician    Medical stability     Progressing in therapy  Orthotic management      Nursing                  PT  Decreased caregiver support                 OT                  SLP                SW                Discharge Planning/Teaching Needs:  HOme with wife who works but  has other family members to assist him. Will need to do family education prior to DC home.      Team Discussion:  Progressing toward his goals of supervision-min assist level. Impulsive at times and MBS today still same Dys 3 nectar thick liquids. Getting return in his shoulder and hand. AFO ordered. Speech to start RMT. Dysarthia is still the same. Will need family education prior to discharge home.   Revisions to Treatment Plan:  DC 2/8    Continued Need for Acute Rehabilitation Level of Care: The patient requires daily medical management by a physician with specialized training in physical medicine and rehabilitation for the following conditions: Daily direction of a multidisciplinary physical rehabilitation program to ensure safe treatment while eliciting the highest outcome  that is of practical value to the patient.: Yes Daily medical management of patient stability for increased activity during participation in an intensive rehabilitation regime.: Yes Daily analysis of laboratory values and/or radiology reports with any subsequent need for medication adjustment of medical intervention for : Neurological problems   I attest that I was present, lead the team conference, and concur with the assessment and plan of the team.   Lucy Chris 09/14/2018, 3:16 PM

## 2018-09-14 NOTE — Progress Notes (Signed)
Physical Therapy Weekly Progress Note  Patient Details  Name: Evan BLACKBURN Sr. MRN: [id number] Date of Birth: [birth year]  Beginning of progress report period: September 07, 2018 End of progress report period: September 14, 2018  Today's Date: 09/14/2018 PT Individual Time: 8453-6468 PT Individual Time Calculation (min): 58 min   Patient has met 3 of 3 short term goals. Pt performs transfers with min assist and is able to ambulate up to 50 ft with RW and min-mod assist. Pt continues to be limited by decreased attention to L, poor L knee control and L sided weakness.   Patient continues to demonstrate the following deficits muscle weakness, impaired timing and sequencing and decreased coordination and decreased standing balance, decreased postural control, hemiplegia and decreased balance strategies and therefore will continue to benefit from skilled PT intervention to increase functional independence with mobility.  Patient progressing toward long term goals..  Continue plan of care.  PT Short Term Goals Week 1:  PT Short Term Goal 1 (Week 1): Pt will transfer bed<>chair w/ min assist consistently PT Short Term Goal 1 - Progress (Week 1): Met PT Short Term Goal 2 (Week 1): Pt will perform bed mobility w/ min assist PT Short Term Goal 2 - Progress (Week 1): Met PT Short Term Goal 3 (Week 1): Pt will maintain midline oritentation w/ functional mobility 100% of the time w/ min cues  PT Short Term Goal 3 - Progress (Week 1): Met Week 2:  PT Short Term Goal 1 (Week 2): STG=LTGs due to ELOS  Skilled Therapeutic Interventions/Progress Updates:    Pt received in therapy gym while seated in w/c, agreeable to therapy tx and denies pain. Pt requesting to go outside this session, pt propelled w/c outside x 200 ft with supervision using R hemi technique. Pt performed x 10 sit<>stands from w/c this session working on L LE NMR and symmetric weightbearing. Pt worked on L LE stance control to perform R LE  stepping in place x 10 steps without AD, mod assist. Pt performed 2 x 10 mini squats this session working on knee control. Pt performed eccentric holds while sitting 5 x 10 sec holds for strengthening and neuro re-ed. Pt worked on L LE stance control while performing R LE side steps in place, therapist providing manual facilitation for L knee positioning, 2 x 10 steps. Pt performed x 10 LAQ while seated in w/c with L LE, emphasis on isometric hold at end range and slow eccentric lowering. Pt propelled w/c back to unit with supervision x 200 ft. Pt ambulated 2 x 30 ft this session with RW, L hand orthosis, L AFO and L shoe cover, min-mod assist with therapist providing tactile cues to block L knee hyperextension. Pt performed lateral step ups 4 inch step with UE support on RW using L LE for knee control, mirror for visual feedback, mod assist. Pt ambulated x 60 ft with RW and min assist, mod assist with turns, therapist providing tactile cues to limit L knee hyperextension. Pt transported back to room and transferred to bed stand pivot min assist. Pt transferred to supine with supervision and left with needs in reach, bed alarm set.   Therapy Documentation Precautions:  Precautions Precautions: Fall Restrictions Weight Bearing Restrictions: No   Therapy/Group: Individual Therapy  Netta Corrigan, PT, DPT 09/14/2018, 11:27 AM

## 2018-09-14 NOTE — Progress Notes (Signed)
Occupational Therapy Session Note  Patient Details  Name: Evan AnoDuncan E Cale Sr. MRN: [id number] Date of Birth: [birth year]  Today's Date: 09/14/2018 OT Individual Time: 1000-1055 OT Individual Time Calculation (min): 55 min    Short Term Goals: Week 2:  OT Short Term Goal 1 (Week 2): Pt will perform toilet transfers with min A and min verbal cues for safety OT Short Term Goal 2 (Week 2): Pt will perform toileting tasks with mod A sit<>stand for clothing management OT Short Term Goal 3 (Week 2): Pt will don underpants and pant with min A OT Short Term Goal 4 (Week 2): Pt will don shoes and fasten with shoe buttrons with min A  Skilled Therapeutic Interventions/Progress Updates:    Pt resting in w/c upon arrival and declined bathing/dressing this morning.  Pt somewhat despondent following MBS earlier.  Pt requested to go outdoors and propelled w/c approx 10 mins during journey.  Pt also engaged in LUE NMR (see below) with improved movement noted in elbow flexion/extension and horizontal adduction.   Therapy Documentation Precautions:  Precautions Precautions: Fall Restrictions Weight Bearing Restrictions: No  Pain:  Pt denies pain   Other Treatments: Treatments Neuromuscular Facilitation: Left;Activity to increase coordination;Activity to increase motor control;Activity to increase timing and sequencing;Activity to increase grading;Activity to increase sustained activation;Activity to increase anterior-posterior weight shifting;Activity to increase lateral weight shifting Weight Bearing Technique Weight Bearing Technique: Yes RUE Weight Bearing Technique: Forearm seated   Therapy/Group: Individual Therapy  Rich BraveLanier, Evan Townsend 09/14/2018, 11:01 AM

## 2018-09-14 NOTE — Progress Notes (Signed)
Modified Barium Swallow Progress Note  Patient Details  Name: Evan SCHORK Sr. MRN: [id number] Date of Birth: 01-18-1956  Today's Date: 09/14/2018  Modified Barium Swallow completed.  Full report located under Chart Review in the Imaging Section.  Brief recommendations include the following:  Clinical Impression  Patient demonstrates a moderate-severe sensorimotor based oropharyngeal dysphagia. Oral phase is characterized by left anterior spillage due to oral motor weakness and decreased bolus cohesion resulting in premature spillage of liquids. Patient consistently initiates his swallow at the pyriform sinuses resulting in silent aspiration of thin liquids. Compensatory strategies were ineffective in reducing aspiration episodes.  Nectar-thick liquids are contained in the pyriform sinuses despite delayed swallow initiation resulting in only flash penetration.  Patient's pharyngeal strength appeared [facility] for all consistencies tested. Recommend patient continue current diet of Dys. 3 textures with nectar-thick liquids and continue the water protocol.  Patient educated in regards to results of MBS and verbalized understanding but will need reinforcement.    Swallow Evaluation Recommendations       SLP Diet Recommendations: Dysphagia 3 (Mech soft) solids;Nectar thick liquid;Free water protocol after oral care   Liquid Administration via: Cup   Medication Administration: Whole meds with puree   Supervision: Patient able to self feed;Intermittent supervision to cue for compensatory strategies   Compensations: Minimize environmental distractions;Slow rate;Small sips/bites;Lingual sweep for clearance of pocketing;Follow solids with liquid   Postural Changes: Seated upright at 90 degrees   Oral Care Recommendations: Oral care BID   Other Recommendations: Order thickener from pharmacy;Prohibited food (jello, ice cream, thin soups);Remove water pitcher;Have oral suction available    Marchia Diguglielmo,  Iyona Pehrson 09/14/2018,3:20 PM

## 2018-09-14 NOTE — Progress Notes (Signed)
Occupational Therapy Session Note  Patient Details  Name: Evan FALLONE Sr. MRN: [id number] Date of Birth: [birth year]  Today's Date: 09/14/2018 OT Individual Time: 1130-1200 OT Individual Time Calculation (min): 30 min    Short Term Goals: Week 2:  OT Short Term Goal 1 (Week 2): Pt will perform toilet transfers with min A and min verbal cues for safety OT Short Term Goal 2 (Week 2): Pt will perform toileting tasks with mod A sit<>stand for clothing management OT Short Term Goal 3 (Week 2): Pt will don underpants and pant with min A OT Short Term Goal 4 (Week 2): Pt will don shoes and fasten with shoe buttrons with min A  Skilled Therapeutic Interventions/Progress Updates:    LUE NMES per below to facilitate improved grasp/release and functional use of LUE with BADLs. Pt unable to replicate movement without NMES. Will incorporate NMR in next session.   1:1 NMES applied to wrist flexors Ratio 1:3 Rate 35 pps Waveform- Asymmetric Ramp 1.0 Pulse 300 Intensity- 19 Duration -   10 mins   No adverse reactions after treatment and is skin intact.   1:1 NMES applied to wrist extensors Ratio 1:3 Rate 35 pps Waveform- Asymmetric Ramp 1.0 Pulse 300 Intensity- 21 Duration -   10 mins   No adverse reactions after treatment and is skin intact.    Therapy Documentation Precautions:  Precautions Precautions: Fall Restrictions Weight Bearing Restrictions: No  Pain: Pain Assessment Pain Scale: 0-10 Pain Score: 0-No pain   Other Treatments: Modalities Modalities: Archivist Stimulation Location: LUE wrist flexors and extensors Electrical Stimulation Action: wrist flexion/extension Electrical Stimulation Goals: Neuromuscular facilitation   Therapy/Group: Individual Therapy  Rich Brave 09/14/2018, 2:59 PM

## 2018-09-14 NOTE — Plan of Care (Signed)
  Problem: Consults Goal: RH STROKE PATIENT EDUCATION Description See Patient Education module for education specifics  Outcome: Progressing Goal: Diabetes Guidelines if Diabetic/Glucose > 140 Description If diabetic or lab glucose is > 140 mg/dl - Initiate Diabetes/Hyperglycemia Guidelines & Document Interventions  Outcome: Progressing   Problem: RH BOWEL ELIMINATION Goal: RH STG MANAGE BOWEL WITH ASSISTANCE Description STG Manage Bowel with min Assistance.  Outcome: Progressing   Problem: RH BLADDER ELIMINATION Goal: RH STG MANAGE BLADDER WITH ASSISTANCE Description STG Manage Bladder With min Assistance  Outcome: Progressing   Problem: RH SKIN INTEGRITY Goal: RH STG MAINTAIN SKIN INTEGRITY WITH ASSISTANCE Description STG Maintain Skin Integrity With min Assistance.  Outcome: Progressing   Problem: RH SAFETY Goal: RH STG ADHERE TO SAFETY PRECAUTIONS W/ASSISTANCE/DEVICE Description STG Adhere to Safety Precautions With min Assistance and appropriate assistive Device.  Outcome: Progressing   Problem: RH PAIN MANAGEMENT Goal: RH STG PAIN MANAGED AT OR BELOW PT'S PAIN GOAL Description <3 on a 0-10 pain scale  Outcome: Progressing   Problem: RH KNOWLEDGE DEFICIT Goal: RH STG INCREASE KNOWLEDGE OF DIABETES Description Patient will demonstrate knowledge of diabetes medications, dietary restrictions, and how to manage hypo/hyperglycemic episodes. Follow up care with the doctor post discharge with min assist from staff.  Outcome: Progressing Goal: RH STG INCREASE KNOWLEDGE OF HYPERTENSION Description Patient will demonstrate knowledge of HTN medications, dietary restrictions, BP parameters, and follow up care with the MD post discharge with min assist from staff.  Outcome: Progressing Goal: RH STG INCREASE KNOWLEDGE OF DYSPHAGIA/FLUID INTAKE Description Patient will demonstrate knowledge of proper swallowing techniques, managing fluid intake with thickened fluids, and  follow up care with the MD post discharge with min assist from staff.  Outcome: Progressing Goal: RH STG INCREASE KNOWLEGDE OF HYPERLIPIDEMIA Description Patient will demonstrate knowledge of HLD medications, dietary restrictions, and follow up care with the MD post discharge with min assist from staff.  Outcome: Progressing Goal: RH STG INCREASE KNOWLEDGE OF STROKE PROPHYLAXIS Description Patient will demonstrate knowledge of stroke prophylactic medications and how they are used to help prevent future strokes. Follow up care with the MD post discharge with min assist from staff.   Outcome: Progressing   

## 2018-09-14 NOTE — Progress Notes (Signed)
Speech Language Pathology Weekly Progress Note  Patient Details  Name: Evan TALCOTT Sr. MRN: [id number] Date of Birth: Apr 23, 1956  Beginning of progress report period: September 06, 2018 End of progress report period: September 14, 2018  Short Term Goals: Week 1: SLP Short Term Goal 1 (Week 1): Patient will consume current diet with minimal overt s/s of aspiration with supervision verbal cues for use of swallowing compensatory strategies.  SLP Short Term Goal 1 - Progress (Week 1): Not met SLP Short Term Goal 2 (Week 1): Patient will consume trials of thin liquids via cup with minimal overt s/s of aspiration with supervision verbal cues over 2 sessions to assess readiness for repeat MBS.  SLP Short Term Goal 2 - Progress (Week 1): Met SLP Short Term Goal 3 (Week 1): Patient will utilize speech intelligibility strategies at the phrase level with Mod A verbal cues to achieve ~90% intelligibility.  SLP Short Term Goal 3 - Progress (Week 1): Met SLP Short Term Goal 4 (Week 1): Patient will self-monitor and correct errors during functional tasks with Min A verbal cues. SLP Short Term Goal 4 - Progress (Week 1): Not met    New Short Term Goals: Week 2: SLP Short Term Goal 1 (Week 2): Patient will consume current diet with minimal overt s/s of aspiration with supervision verbal cues for use of swallowing compensatory strategies.  SLP Short Term Goal 2 (Week 2): Patient will utilize speech intelligibility strategies at the sentence level with Mod A verbal cues to achieve ~90% intelligibility.  SLP Short Term Goal 3 (Week 2): Patient will self-monitor and correct errors during functional tasks with Min A verbal cues.  Weekly Progress Updates: Patient has made functional gains and has met 2 of 4 STGs this reporting period. Currently, patient is consuming Dys. 3 textures with nectar-thick liquids with minimal overt s/s of aspiration and overall Min A verbal cues for use of swallowing compensatory  strategies. Patient is participating in the water protocol with Min A verbal cues for use of small sips. Patient with repeat MBS today with minimal progress in swallowing function with continued delayed swallow initiation and silent aspiration with thin liquids despite compensatory strategies. Patient demonstrates improved speech intelligibility at the phrase level and is overall 75% intelligible at the phrase level with overall Mod A verbal cues for use of speech intelligibility strategies with cues needed to self-monitor and correct errors. Patient and family education ongoing. Patient would benefit from continued skilled SLP intervention to maximize his swallowing and cognitive functioning and overall speech intelligibility prior to discharge.       Intensity: Minumum of 1-2 x/day, 30 to 90 minutes Frequency: 3 to 5 out of 7 days Duration/Length of Stay: 2/8 Treatment/Interventions: Cognitive remediation/compensation;Environmental controls;Internal/external aids;Speech/Language facilitation;Therapeutic Activities;Patient/family education;Functional tasks;Dysphagia/aspiration precaution training;Cueing hierarchy    Gentle Hoge, [place] 09/14/2018, 11:19 AM

## 2018-09-14 NOTE — Progress Notes (Signed)
Orthopedic Tech Progress Note Patient Details:  Evan COSTILOW Sr. 1956-07-14 [id number] Called and placed order for the AFO with [employer]. Patient ID: JI FILA Sr., male   DOB: Jun 26, 1956, 63 y.o.   MRN: [id number]   Daphane Shepherd 09/14/2018, 2:30 PM

## 2018-09-15 ENCOUNTER — Inpatient Hospital Stay (HOSPITAL_COMMUNITY): Payer: Medicaid Other | Admitting: Occupational Therapy

## 2018-09-15 ENCOUNTER — Inpatient Hospital Stay (HOSPITAL_COMMUNITY): Payer: Medicaid Other | Admitting: Speech Pathology

## 2018-09-15 ENCOUNTER — Inpatient Hospital Stay (HOSPITAL_COMMUNITY): Payer: Medicaid Other | Admitting: Physical Therapy

## 2018-09-15 LAB — GLUCOSE, CAPILLARY
Glucose-Capillary: 109 mg/dL — ABNORMAL HIGH (ref 70–99)
Glucose-Capillary: 98 mg/dL (ref 70–99)
Glucose-Capillary: 120 mg/dL — ABNORMAL HIGH (ref 70–99)
Glucose-Capillary: 87 mg/dL (ref 70–99)

## 2018-09-15 MED ORDER — GABAPENTIN 300 MG PO CAPS
600.0000 mg | ORAL_CAPSULE | Freq: Two times a day (BID) | ORAL | Status: DC
  Administered 2018-09-15 – 2018-09-23 (×16): 600 mg via ORAL
  Filled 2018-09-15 (×17): qty 2

## 2018-09-15 NOTE — Progress Notes (Signed)
Occupational Therapy Session Note  Patient Details  Name: Evan ARNWINE Sr. MRN: [id number] Date of Birth: [birth year]  Today's Date: 09/15/2018 OT Individual Time: 2440-1027 OT Individual Time Calculation (min): 77 min    Short Term Goals: Week 2:  OT Short Term Goal 1 (Week 2): Pt will perform toilet transfers with min A and min verbal cues for safety OT Short Term Goal 2 (Week 2): Pt will perform toileting tasks with mod A sit<>stand for clothing management OT Short Term Goal 3 (Week 2): Pt will don underpants and pant with min A OT Short Term Goal 4 (Week 2): Pt will don shoes and fasten with shoe buttrons with min A  Skilled Therapeutic Interventions/Progress Updates:    Patient seated in recliner with nursing upon arrival.  He requests a shower today and denies pain.  SPT to/from recliner, w/c, shower bench, mat table with min A.  Unsupported sitting balance with CS occ min A for LOB.  Bathing CS and cues for safety, Assist for washing right UE, buttocks and left LE.  UB dressing - mod A, LB dressing - mod A, footwear - max A.   Therapeutic Activity  - to include NMRE left UE (weight bearing, proximal control/maintaining position in a variety of circumstances), AAROM, trunk control/sitting balance activities, provided dycem for use during TA and educated patient on multiple uses for functional activity.  Patient is pleasant and cooperative t/o session.  He responds to feedback and asks appropriate questions.  Improved motor control at shoulder and elbow.  Patient returned to recliner at close of session with seat belt alarm set and call bell/phone in reach.    Therapy Documentation Precautions:  Precautions Precautions: Fall Restrictions Weight Bearing Restrictions: No General:   Vital Signs:  Pain: Pain Assessment Pain Scale: 0-10 Pain Score: 0-No pain Other Treatments:     Therapy/Group: Individual Therapy  Barrie Lyme 09/15/2018, 4:16 PM

## 2018-09-15 NOTE — Progress Notes (Signed)
[facility] PHYSICAL MEDICINE & REHABILITATION PROGRESS NOTE   Subjective/Complaints:  Patient still has some numbness and tingling in his feet but not very painful to the point where it keeps him up ROS- neg CP, SOB, N/V/D  Objective:   Dg Swallowing Func-speech Pathology  Result Date: 09/14/2018 Objective Swallowing Evaluation: Type of Study: MBS-Modified Barium Swallow Study  Patient Details Name: Evan TOPF Sr. MRN: [id number] Date of Birth: Feb 14, 1956 Today's Date: 09/14/2018 Time: SLP Start Time (ACUTE ONLY): 0900 -SLP Stop Time (ACUTE ONLY): 0930 SLP Time Calculation (min) (ACUTE ONLY): 30 min Past Medical History: Past Medical History: Diagnosis Date . Diabetes 1.5, managed as type 2 (HCC)  . Diabetes mellitus without complication (HCC)  . Hypercholesterolemia  . Hypertension  . Pain in both feet  . Stroke [facility])  Past Surgical History: No past surgical history on file. HPI: See H&P  Subjective: pt awake, sitting in MBSS chair. Noted Left oral weakness and Left UE weakness; decreased tone in Left corner of mouth. Pt was verbally responsive to general questions; Dysarthria noted. Assessment / Plan / Recommendation CHL IP CLINICAL IMPRESSIONS 09/14/2018 Clinical Impression Patient demonstrates a moderate-severe sensorimotor based oropharyngeal dysphagia. Oral phase is characterized by left anterior spillage due to oral motor weakness and decreased bolus cohesion resulting in premature spillage of liquids. Patient consistently initiates his swallow at the pyriform sinuses resulting in silent aspiration of thin liquids. Compensatory strategies were ineffective in reducing aspiration episodes.  Nectar-thick liquids are contained in the pyriform sinuses despite delayed swallow initiation resulting in only flash penetration.  Patient's pharyngeal strength appeared [facility] for all consistencies tested. Recommend patient continue current diet of Dys. 3 textures with nectar-thick liquids and continue the  water protocol.  Patient educated in regards to results of MBS and verbalized understanding but will need reinforcement.  SLP Visit Diagnosis Dysphagia, oropharyngeal phase (R13.12) Attention and concentration deficit following -- Frontal lobe and executive function deficit following -- Impact on safety and function Moderate aspiration risk;Severe aspiration risk   CHL IP TREATMENT RECOMMENDATION 09/14/2018 Treatment Recommendations Therapy as outlined in treatment plan below   Prognosis 09/14/2018 Prognosis for Safe Diet Advancement Good Barriers to Reach Goals -- Barriers/Prognosis Comment -- CHL IP DIET RECOMMENDATION 09/14/2018 SLP Diet Recommendations Dysphagia 3 (Mech soft) solids;Nectar thick liquid;Free water protocol after oral care Liquid Administration via Cup Medication Administration Whole meds with puree Compensations Minimize environmental distractions;Slow rate;Small sips/bites;Lingual sweep for clearance of pocketing;Follow solids with liquid Postural Changes Seated upright at 90 degrees   CHL IP OTHER RECOMMENDATIONS 09/14/2018 Recommended Consults -- Oral Care Recommendations Oral care BID Other Recommendations Order thickener from pharmacy;Prohibited food (jello, ice cream, thin soups);Remove water pitcher;Have oral suction available   CHL IP FOLLOW UP RECOMMENDATIONS 09/14/2018 Follow up Recommendations Inpatient Rehab   CHL IP FREQUENCY AND DURATION 09/14/2018 Speech Therapy Frequency (ACUTE ONLY) min 3x week Treatment Duration 2 weeks      CHL IP ORAL PHASE 09/14/2018 Oral Phase Impaired Oral - Pudding Teaspoon -- Oral - Pudding Cup -- Oral - Honey Teaspoon -- Oral - Honey Cup -- Oral - Nectar Teaspoon -- Oral - Nectar Cup Left anterior bolus loss;Decreased bolus cohesion;Premature spillage Oral - Nectar Straw -- Oral - Thin Teaspoon -- Oral - Thin Cup Premature spillage;Decreased bolus cohesion;Left anterior bolus loss Oral - Thin Straw Left anterior bolus loss;Decreased bolus cohesion;Premature  spillage Oral - Puree -- Oral - Mech Soft -- Oral - Regular -- Oral - Multi-Consistency -- Oral - Pill -- Oral Phase -  Comment --  CHL IP PHARYNGEAL PHASE 09/14/2018 Pharyngeal Phase Impaired Pharyngeal- Pudding Teaspoon -- Pharyngeal -- Pharyngeal- Pudding Cup -- Pharyngeal -- Pharyngeal- Honey Teaspoon -- Pharyngeal -- Pharyngeal- Honey Cup -- Pharyngeal -- Pharyngeal- Nectar Teaspoon -- Pharyngeal -- Pharyngeal- Nectar Cup Delayed swallow initiation-pyriform sinuses;Penetration/Aspiration during swallow Pharyngeal Material enters airway, remains ABOVE vocal cords then ejected out Pharyngeal- Nectar Straw -- Pharyngeal -- Pharyngeal- Thin Teaspoon -- Pharyngeal -- Pharyngeal- Thin Cup Delayed swallow initiation-vallecula;Delayed swallow initiation-pyriform sinuses;Compensatory strategies attempted (with notebox);Penetration/Aspiration during swallow Pharyngeal Material does not enter airway;Material enters airway, passes BELOW cords without attempt by patient to eject out (silent aspiration) Pharyngeal- Thin Straw Delayed swallow initiation-pyriform sinuses;Compensatory strategies attempted (with notebox);Penetration/Aspiration during swallow Pharyngeal Material enters airway, passes BELOW cords without attempt by patient to eject out (silent aspiration) Pharyngeal- Puree -- Pharyngeal -- Pharyngeal- Mechanical Soft -- Pharyngeal -- Pharyngeal- Regular -- Pharyngeal -- Pharyngeal- Multi-consistency -- Pharyngeal -- Pharyngeal- Pill -- Pharyngeal -- Pharyngeal Comment --  CHL IP CERVICAL ESOPHAGEAL PHASE 09/05/2018 Cervical Esophageal Phase WFL Pudding Teaspoon -- Pudding Cup -- Honey Teaspoon -- Honey Cup -- Nectar Teaspoon -- Nectar Cup -- Nectar Straw -- Thin Teaspoon -- Thin Cup -- Thin Straw -- Puree -- Mechanical Soft -- Regular -- Multi-consistency -- Pill -- Cervical Esophageal Comment -- PAYNE, COURTNEY 09/14/2018, 3:19 PM    Feliberto Gottron, MA, CCC-SLP [phone]           No results for input(s): WBC, HGB,  HCT, PLT in the last 72 hours. Recent Labs    09/13/18 0529  CREATININE 1.25*    Intake/Output Summary (Last 24 hours) at 09/15/2018 1328 Last data filed at 09/15/2018 1300 Gross per 24 hour  Intake 993 ml  Output 400 ml  Net 593 ml     Physical Exam: Vital Signs Blood pressure 138/70, pulse (!) 53, temperature 97.8 F (36.6 C), temperature source Oral, resp. rate 15, height 5\' 6"  (1.676 m), weight 68.1 kg, SpO2 100 %.   General: No acute distress Mood and affect are appropriate Heart: bradycardia , normal rhythm no rubs murmurs or extra sounds Lungs: Clear to auscultation, breathing unlabored, no rales or wheezes Abdomen: Positive bowel sounds, soft nontender to palpation, nondistended Extremities: No clubbing, cyanosis, or edema Skin: No evidence of breakdown, no evidence of rash Neurologic: Cranial nerves II through XII intact, motor strength is 5/5 in RIght deltoid, bicep, tricep, grip, hip flexor, knee extensors, ankle dorsiflexor and plantar flexor, 0/5 LUE, 3- LLE Quad and hip add, 2-/5 Left ankle DF/PF, increase tone  Sensory exam normal sensation to light touch and proprioception in bilateral upper and lower extremities Cerebellar exam normal unable to do finger to nose to finger left upper     Musculoskeletal: Full range of motion in all 4 extremities. No joint swelling   Assessment/Plan: 1. 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  Physiatrist is providing close team supervision and 24 hour management of active medical problems listed below.  Physiatrist and rehab team continue to assess barriers to discharge/monitor patient progress toward functional and medical goals  Care Tool:  Bathing    Body parts bathed by patient: Left arm, Chest, Abdomen, Front perineal area, Right upper leg, Left upper leg, Face, Right lower leg   Body  parts bathed by helper: Left lower leg, Buttocks, Right arm     Bathing assist Assist Level: Moderate Assistance - Patient  50 - 74%     Upper Body Dressing/Undressing Upper body dressing   What is the patient wearing?: Pull over shirt    Upper body assist Assist Level: Minimal Assistance - Patient > 75%    Lower Body Dressing/Undressing Lower body dressing      What is the patient wearing?: Pants, Underwear/pull up     Lower body assist Assist for lower body dressing: Moderate Assistance - Patient 50 - 74%     Toileting Toileting    Toileting assist Assist for toileting: Maximal Assistance - Patient 25 - 49%     Transfers Chair/bed transfer  Transfers assist     Chair/bed transfer assist level: Minimal Assistance - Patient > 75%     Locomotion Ambulation   Ambulation assist      Assist level: Moderate Assistance - Patient 50 - 74% Assistive device: Walker-rolling(orthosis and shoe cover) Max distance: 60 ft   Walk 10 feet activity   Assist     Assist level: Moderate Assistance - Patient - 50 - 74% Assistive device: Walker-rolling, Orthosis(shoe cover)   Walk 50 feet activity   Assist Walk 50 feet with 2 turns activity did not occur: Safety/medical concerns  Assist level: Moderate Assistance - Patient - 50 - 74% Assistive device: Walker-rolling, Orthosis    Walk 150 feet activity   Assist Walk 150 feet activity did not occur: Safety/medical concerns         Walk 10 feet on uneven surface  activity   Assist Walk 10 feet on uneven surfaces activity did not occur: Safety/medical concerns         Wheelchair     Assist Will patient use wheelchair at discharge?: (TBD) Type of Wheelchair: Manual    Wheelchair assist level: Supervision/Verbal cueing Max wheelchair distance: 150    Wheelchair 50 feet with 2 turns activity    Assist        Assist Level: Supervision/Verbal cueing   Wheelchair 150 feet activity      Assist     Assist Level: Supervision/Verbal cueing    Medical Problem List and Plan: 1.   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  DVT Prophylaxis/Anticoagulation: subcutaneous Lovenox. Monitor for any bleeding episodes 3. Pain Management: Neurontin 600 mg 3 times a day reduced to twice daily given creatinine clearance,tramadol as needed             -support while bed/chair to reduce sublux 4. Mood:  Provide emotional support 5. Neuropsych: This patient is capable of making decisions on his own behalf. 6. Skin/Wound Care:  Routine skin checks 7. Fluids/Electrolytes/Nutrition:  Routine in and out's with follow-up chemistries 8. Dysphagia. Dysphagia #3 nectar thick liquids. Follow-up speech therapy, advance as tolerated 9. Diabetes mellitus with peripheral neuropathy. Hemoglobin A1c 10.3. Glucotrol 10 mg daily, Levemir 12 units daily. Check blood sugars before meals and at bedtime. Diabetic teaching CBG (last 3)  Recent Labs    09/14/18 2107 09/15/18 0638 09/15/18 1135  GLUCAP 169* 109* 98  good control 09/15/2018 10. Hypertension. Lisinopril 40 mg daily. Vitals:   09/14/18 2003 09/15/18 0356  BP: (!) 159/75 138/70  Pulse: (!) 51 (!) 53  Resp: 15 15  Temp: 97.7 F (36.5 C) 97.8 F (36.6 C)  SpO2: 99% 100%  controlled 09/15/2018, elevated systolic yest pm ,bradycardia EKG 1/16  sinus  Huston Foley , asymptomatic 11. Hyperlipidemia. Lipitor 12. Tobacco as well as polysubstance abuse. NicoDerm patch. Provide counseling     LOS: 9 days A FACE TO FACE EVALUATION WAS PERFORMED  Erick Colace 09/15/2018, 1:28 PM

## 2018-09-15 NOTE — Progress Notes (Signed)
Speech Language Pathology Daily Session Note  Patient Details  Name: Evan LIENHARD Sr. MRN: [id number] Date of Birth: December 04, 1955  Today's Date: 09/15/2018 SLP Individual Time: 0820-0900 SLP Individual Time Calculation (min): 40 min  Short Term Goals: Week 2: SLP Short Term Goal 1 (Week 2): Patient will consume current diet with minimal overt s/s of aspiration with supervision verbal cues for use of swallowing compensatory strategies.  SLP Short Term Goal 2 (Week 2): Patient will utilize speech intelligibility strategies at the sentence level with Mod A verbal cues to achieve ~90% intelligibility.  SLP Short Term Goal 3 (Week 2): Patient will self-monitor and correct errors during functional tasks with Min A verbal cues.  Skilled Therapeutic Interventions: Skilled treatment session focused on dysphagia and speech goals. SLP facilitated session by reviewing the results of the MBS with the patient while utilizing the video for reinforcement of information. Patient verbalized understanding of results and recommendations. SLP also facilitated session by providing RMT testing for improvement of breath support for speech and cough strength. Patient's peak MEP was [facility] but patient's peak MIP was 51 cm H2O which is below the patient's reference point. IMST exercises were initiated and patient completed 25 repetitions with supervision verbal cues for accuracy with a self-perceived effort level of 7/10.  Patient left upright in bed with alarm on and all needs within reach. Continue with current plan of care.      Pain No/Denies Pain   Therapy/Group: Individual Therapy  Jerae Izard 09/15/2018, 11:59 AM

## 2018-09-15 NOTE — Progress Notes (Signed)
Physical Therapy Session Note  Patient Details  Name: Evan KIRN Sr. MRN: [id number] Date of Birth: 1956/05/19  Today's Date: 09/15/2018 PT Individual Time: 1300-1410 PT Individual Time Calculation (min): 70 min   Short Term Goals: Week 2:  PT Short Term Goal 1 (Week 2): STG=LTGs due to ELOS  Skilled Therapeutic Interventions/Progress Updates:   Pt in recliner and agreeable to therapy, no c/o pain. Transferred to w/c via stand pivot w/ min assist. Pt self-propelled w/c to therapy gym w/ supervision via R hemi technique. Ortho rep present to assess for LAFO use during gait. Ambulated in 5+ bouts of 10-25' w/ min-mod assist using RW, LUE orthosis, and various LAFOs. AFOs trial-ed to increase pt's independence w/ L foot clearance and to decrease L knee hyperextension in single leg stance. Pt requesting to go outside after gait bouts. Pt self-propelled w/c in community setting to/from outside of hospital w/ supervision via R hemi technique. Brief rest break outside before returning to unit. Ended session in recliner, all needs in reach.   Therapy Documentation Precautions:  Precautions Precautions: Fall Restrictions Weight Bearing Restrictions: No Pain: Pain Assessment Pain Scale: 0-10 Pain Score: 0-No pain  Therapy/Group: Individual Therapy  Fredrick Geoghegan Melton Krebs 09/15/2018, 2:11 PM

## 2018-09-16 ENCOUNTER — Inpatient Hospital Stay (HOSPITAL_COMMUNITY): Payer: Medicaid Other | Admitting: Speech Pathology

## 2018-09-16 ENCOUNTER — Inpatient Hospital Stay (HOSPITAL_COMMUNITY): Payer: Medicaid Other | Admitting: Physical Therapy

## 2018-09-16 ENCOUNTER — Inpatient Hospital Stay (HOSPITAL_COMMUNITY): Payer: Medicaid Other | Admitting: Occupational Therapy

## 2018-09-16 ENCOUNTER — Inpatient Hospital Stay (HOSPITAL_COMMUNITY): Payer: Medicaid Other

## 2018-09-16 LAB — GLUCOSE, CAPILLARY
Glucose-Capillary: 61 mg/dL — ABNORMAL LOW (ref 70–99)
Glucose-Capillary: 83 mg/dL (ref 70–99)
Glucose-Capillary: 70 mg/dL (ref 70–99)
Glucose-Capillary: 109 mg/dL — ABNORMAL HIGH (ref 70–99)
Glucose-Capillary: 128 mg/dL — ABNORMAL HIGH (ref 70–99)

## 2018-09-16 MED ORDER — GLIPIZIDE 5 MG PO TABS
5.0000 mg | ORAL_TABLET | Freq: Every day | ORAL | Status: DC
  Administered 2018-09-17 – 2018-09-23 (×6): 5 mg via ORAL
  Filled 2018-09-16 (×7): qty 1

## 2018-09-16 MED ORDER — INSULIN DETEMIR 100 UNIT/ML ~~LOC~~ SOLN
10.0000 [IU] | Freq: Every day | SUBCUTANEOUS | Status: DC
  Administered 2018-09-16 – 2018-09-22 (×7): 10 [IU] via SUBCUTANEOUS
  Filled 2018-09-16 (×7): qty 0.1

## 2018-09-16 MED ORDER — TOBRAMYCIN-DEXAMETHASONE 0.3-0.1 % OP SUSP
1.0000 [drp] | OPHTHALMIC | Status: DC
  Administered 2018-09-16 – 2018-09-23 (×33): 1 [drp] via OPHTHALMIC
  Filled 2018-09-16: qty 2.5

## 2018-09-16 NOTE — Progress Notes (Signed)
Spoke with pt this morning about his constipation, he has not had a bm since 1/28, pt refused to take anything, does not want prune juice or apple juice, refused sorbitol and stated "I will have one on my own and don't want diarrhea".  As of now pt has not had bm, did not want to take sorbitol but then agreed to take it.  Sorbitol given, pt still afraid of having diarrhea.

## 2018-09-16 NOTE — Progress Notes (Signed)
Notify Dan PA of pt cbg low this AM at 61 and now 70 prior to lunch.  No further orders at this time. Pt is eating lunch and no s/s of hypoglycemia.

## 2018-09-16 NOTE — Progress Notes (Signed)
[place] PHYSICAL MEDICINE & REHABILITATION PROGRESS NOTE   Subjective/Complaints:  Hypoglycemia this am, and prior to lunch  Intake fluctuates 50-100% Also has irritated left eye no drainage ROS- neg CP, SOB, N/V/D  Objective:   No results found. No results for input(s): WBC, HGB, HCT, PLT in the last 72 hours. No results for input(s): NA, K, CL, CO2, GLUCOSE, BUN, CREATININE, CALCIUM in the last 72 hours.  Intake/Output Summary (Last 24 hours) at 09/16/2018 1446 Last data filed at 09/15/2018 1730 Gross per 24 hour  Intake 240 ml  Output -  Net 240 ml     Physical Exam: Vital Signs Blood pressure (!) 130/59, pulse (!) 54, temperature 97.8 F (36.6 C), temperature source Oral, resp. rate 17, height 5\' 6"  (1.676 m), weight 68.1 kg, SpO2 100 %.   General: No acute distress Left sclera injected esp lateral to pupil Mood and affect are appropriate Heart: bradycardia , normal rhythm no rubs murmurs or extra sounds Lungs: Clear to auscultation, breathing unlabored, no rales or wheezes Abdomen: Positive bowel sounds, soft nontender to palpation, nondistended Extremities: No clubbing, cyanosis, or edema Skin: No evidence of breakdown, no evidence of rash Neurologic: Cranial nerves II through XII intact, motor strength is 5/5 in RIght deltoid, bicep, tricep, grip, hip flexor, knee extensors, ankle dorsiflexor and plantar flexor, Trace horz adduction  LUE, 3- LLE Quad and hip add, 2-/5 Left ankle DF/PF, increase tone  Sensory exam normal sensation to light touch and proprioception in bilateral upper and lower extremities Cerebellar exam normal unable to do finger to nose to finger left upper     Musculoskeletal: Full range of motion in all 4 extremities. No joint swelling   Assessment/Plan: 1. 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  Physiatrist  is providing close team supervision and 24 hour management of active medical problems listed below.  Physiatrist and rehab team continue to assess barriers to discharge/monitor patient progress toward functional and medical goals  Care Tool:  Bathing    Body parts bathed by patient: Left arm, Chest, Abdomen, Front perineal area, Right upper leg, Left upper leg, Face, Right lower leg   Body parts bathed by helper: Right arm, Buttocks, Left lower leg     Bathing assist Assist Level: Moderate Assistance - Patient 50 - 74%     Upper Body Dressing/Undressing Upper body dressing   What is the patient wearing?: Pull over shirt    Upper body assist Assist Level: Moderate Assistance - Patient 50 - 74%    Lower Body Dressing/Undressing Lower body dressing      What is the patient wearing?: Pants, Underwear/pull up     Lower body assist Assist for lower body dressing: Moderate Assistance - Patient 50 - 74%     Toileting Toileting    Toileting assist Assist for toileting: Maximal Assistance - Patient 25 - 49%     Transfers Chair/bed transfer  Transfers assist     Chair/bed transfer assist level: Minimal Assistance - Patient > 75%     Locomotion Ambulation   Ambulation assist      Assist level: Moderate Assistance - Patient 50 - 74% Assistive device: Walker-rolling Max distance: 10'   Walk 10 feet activity   Assist     Assist level: Moderate Assistance - Patient - 50 - 74% Assistive device: Walker-rolling, Orthosis   Walk 50 feet activity  Assist Walk 50 feet with 2 turns activity did not occur: Safety/medical concerns  Assist level: Moderate Assistance - Patient - 50 - 74% Assistive device: Walker-rolling, Orthosis    Walk 150 feet activity   Assist Walk 150 feet activity did not occur: Safety/medical concerns         Walk 10 feet on uneven surface  activity   Assist Walk 10 feet on uneven surfaces activity did not occur: Safety/medical  concerns         Wheelchair     Assist Will patient use wheelchair at discharge?: (TBD) Type of Wheelchair: Manual    Wheelchair assist level: Supervision/Verbal cueing Max wheelchair distance: 150    Wheelchair 50 feet with 2 turns activity    Assist        Assist Level: Supervision/Verbal cueing   Wheelchair 150 feet activity     Assist     Assist Level: Supervision/Verbal cueing    Medical Problem List and Plan: 1.   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  DVT Prophylaxis/Anticoagulation: subcutaneous Lovenox. Monitor for any bleeding episodes 3. Pain Management: Neurontin 600 mg 3 times a day reduced to twice daily given creatinine clearance,tramadol as needed             -support while bed/chair to reduce sublux 4. Mood:  Provide emotional support 5. Neuropsych: This patient is capable of making decisions on his own behalf. 6. Skin/Wound Care:  Routine skin checks 7. Fluids/Electrolytes/Nutrition:  Routine in and out's with follow-up chemistries 8. Dysphagia. Dysphagia #3 nectar thick liquids. Follow-up speech therapy, advance as tolerated 9. Diabetes mellitus with peripheral neuropathy. Hemoglobin A1c 10.3. Glucotrol 10 mg daily reduce to 5mg ,  Reduce levimir to 10mg  Qhs 1/31 Check blood sugars before meals and at bedtime. Diabetic teaching CBG (last 3)  Recent Labs    09/16/18 0659 09/16/18 0715 09/16/18 1156  GLUCAP 61* 83 70  see above 10. Hypertension. Lisinopril 40 mg daily. Vitals:   09/15/18 2016 09/16/18 0551  BP: (!) 107/54 (!) 130/59  Pulse: (!) 53 (!) 54  Resp: 16 17  Temp: (!) 97.5 F (36.4 C) 97.8 F (36.6 C)  SpO2: 100% 100%  controlled 09/16/2018, ,bradycardia EKG 1/16 sinus  Brady , asymptomatic 11. Hyperlipidemia. Lipitor 12. Tobacco as well as polysubstance abuse. NicoDerm patch. Provide  counseling     LOS: 10 days A FACE TO FACE EVALUATION WAS PERFORMED  Erick Colace 09/16/2018, 2:46 PM

## 2018-09-16 NOTE — Progress Notes (Signed)
Occupational Therapy Session Note  Patient Details  Name: Evan TENERELLI Sr. MRN: [id number] Date of Birth: Jan 09, 1956  Today's Date: 09/16/2018 OT Individual Time: 2353-6144 OT Individual Time Calculation (min): 45 min    Short Term Goals: Week 2:  OT Short Term Goal 1 (Week 2): Pt will perform toilet transfers with min A and min verbal cues for safety OT Short Term Goal 2 (Week 2): Pt will perform toileting tasks with mod A sit<>stand for clothing management OT Short Term Goal 3 (Week 2): Pt will don underpants and pant with min A OT Short Term Goal 4 (Week 2): Pt will don shoes and fasten with shoe buttrons with min A  Skilled Therapeutic Interventions/Progress Updates:    1:1 Focus on NMR. Started in supine on mat with focus on symmetrical  arm push ups with left hand wrapped on surface. PT able to activate proximally  to elbow with min A for terminal elbow extension (min A). Transitioned into supported sidelying for heavy weight bearing through left UE with functional reach of right UE. Transitioned to trunk elongation and shortening first laying on left side and then on right for trunk activation. Transitioned into standing with stepping without UE support challenging balance without AFO donned (shoe taken for toe cap) with focus on knee control vs hyperextention.  Sidestepping down the hallway both ways with focus on weight shifts and picking up feet when stepping. Left in w/c setup visiting with MD.   Therapy Documentation Precautions:  Precautions Precautions: Fall Restrictions Weight Bearing Restrictions: No General:   Vital Signs:  Pain: No c/o pain in session      Therapy/Group: Individual Therapy  Roney Mans [facility] 09/16/2018, 10:20 AM

## 2018-09-16 NOTE — Progress Notes (Signed)
Speech Language Pathology Daily Session Note  Patient Details  Name: Evan OZAETA Sr. MRN: [id number] Date of Birth: 1956/05/21  Today's Date: 09/16/2018 SLP Individual Time: 1450-1530 SLP Individual Time Calculation (min): 40 min  Short Term Goals: Week 2: SLP Short Term Goal 1 (Week 2): Patient will consume current diet with minimal overt s/s of aspiration with supervision verbal cues for use of swallowing compensatory strategies.  SLP Short Term Goal 2 (Week 2): Patient will utilize speech intelligibility strategies at the sentence level with Mod A verbal cues to achieve ~90% intelligibility.  SLP Short Term Goal 3 (Week 2): Patient will self-monitor and correct errors during functional tasks with Min A verbal cues.  Skilled Therapeutic Interventions:  Pt was seen for skilled ST targeting communication and dysphagia goals.  SLP facilitated the session with trials of thin liquids following oral care per the water protocol.  Pt demonstrated 2 instances of reflexive coughing initially which subsided as pt slowed down his pace.  Pt was able to return demonstration of IMST device for 25 repetitions with min assist verbal cues for coordinating breath cycles with device use.  During functional conversations with therapist, pt needed mod assist verbal cues to slow rate and increase vocal intensity to achieve intelligibility at the sentence level.  Intelligibility deficits are exacerbated by his self consciousness related to poor containment of saliva and drolling, for which pt compensates by keeping a towel in front of his mouth.  Pt was left in bed with bed alarm set and call bell within reach.  Continue per current plan of care.    Pain Pain Assessment Pain Scale: 0-10 Pain Score: 0-No pain  Therapy/Group: Individual Therapy  Riah Kehoe, Melanee Spry 09/16/2018, 4:11 PM

## 2018-09-16 NOTE — Progress Notes (Signed)
Physical Therapy Session Note  Patient Details  Name: Evan BOONSTRA Sr. MRN: [id number] Date of Birth: 1955/08/25  Today's Date: 09/16/2018 PT Individual Time: 1115-1200 PT Individual Time Calculation (min): 45 min   Short Term Goals: Week 2:  PT Short Term Goal 1 (Week 2): STG=LTGs due to ELOS  Skilled Therapeutic Interventions/Progress Updates:   Pt in supine and agreeable to therapy, denies pain. Min assist transfer to EOB and to w/c via stand pivot. Total assist w/c transport to/from therapy gym for time management. Session focused on LLE NMR w/ emphasis on posterior chain and pelvic stabilization musculature. Supine bridge LLE only 2x10 w/ tactile cues for L glut and hamstring activation. Resisted L abduction in hooklying w/ beige theraband 3x10 and isometric L abduction in hooklying 3x10 as well. Tactile cues for L abductor activation. Supine L heel slide 3x10. Sit<>stand w/ RUE over LUE on L knee to facilitate increased LLE use during transition. Partial knee bends w/o UE support, min assist, 3x10 w/ emphasis on LLE eccentric knee control. Returned to room and ended session in w/c, all needs in reach.   Therapy Documentation Precautions:  Precautions Precautions: Fall Restrictions Weight Bearing Restrictions: No  Therapy/Group: Individual Therapy  Kiersten Coss K Loucinda Croy 09/16/2018, 12:00 PM

## 2018-09-16 NOTE — Progress Notes (Signed)
Occupational Therapy Session Note  Patient Details  Name: Evan SELLARDS Sr. MRN: [id number] Date of Birth: 1956/04/15  Today's Date: 09/16/2018 OT Individual Time: 8416-6063 OT Individual Time Calculation (min): 72 min    Short Term Goals: Week 2:  OT Short Term Goal 1 (Week 2): Pt will perform toilet transfers with min A and min verbal cues for safety OT Short Term Goal 2 (Week 2): Pt will perform toileting tasks with mod A sit<>stand for clothing management OT Short Term Goal 3 (Week 2): Pt will don underpants and pant with min A OT Short Term Goal 4 (Week 2): Pt will don shoes and fasten with shoe buttrons with min A  Skilled Therapeutic Interventions/Progress Updates:    OT intervention with focus on BADL retraining, functional transfers, sitting balance, standing balance, LUE NMR, activity tolerance, and safety awareness to increase independence with BADLs.  See below for NMR and assist levels with BADLs. Pt recalls hemi dressing techniques and initiates technique but requires assistance to complete task.  Pt requires min verbal cues for sequencing and safety with sit<>stand and functional transfers. Pt with improved shoulder and elbow activation after repetition. Pt remained in w/c with all needs within reach and belt alarm activated.   Therapy Documentation Precautions:  Precautions Precautions: Fall Restrictions Weight Bearing Restrictions: No Pain:  Pt denies pain this morning  ADL: ADL Grooming: Contact guard Where Assessed-Grooming: Sitting at sink Upper Body Bathing: Minimal assistance Where Assessed-Upper Body Bathing: Shower Lower Body Bathing: Minimal assistance Where Assessed-Lower Body Bathing: Shower Upper Body Dressing: Minimal assistance Where Assessed-Upper Body Dressing: Sitting at sink, Wheelchair Lower Body Dressing: Moderate assistance Where Assessed-Lower Body Dressing: Sitting at sink, Wheelchair, Standing at [employer]: Tour manager Method: Warden/ranger: Grab bars, Transfer tub bench   Other Treatments: Treatments Neuromuscular Facilitation: Left;Activity to increase coordination;Activity to increase motor control;Activity to increase timing and sequencing;Activity to increase grading;Activity to increase sustained activation;Activity to increase anterior-posterior weight shifting;Activity to increase lateral weight shifting Weight Bearing Technique Weight Bearing Technique: Yes LUE Weight Bearing Technique: Forearm seated;Extended arm seated Response to Weight Bearing Technique: improved shoulder and elbow activation   Therapy/Group: Individual Therapy  Rich Brave 09/16/2018, 8:17 AM

## 2018-09-17 LAB — GLUCOSE, CAPILLARY
Glucose-Capillary: 89 mg/dL (ref 70–99)
Glucose-Capillary: 136 mg/dL — ABNORMAL HIGH (ref 70–99)
Glucose-Capillary: 53 mg/dL — ABNORMAL LOW (ref 70–99)
Glucose-Capillary: 66 mg/dL — ABNORMAL LOW (ref 70–99)
Glucose-Capillary: 69 mg/dL — ABNORMAL LOW (ref 70–99)
Glucose-Capillary: 97 mg/dL (ref 70–99)
Glucose-Capillary: 189 mg/dL — ABNORMAL HIGH (ref 70–99)

## 2018-09-17 MED ORDER — BLOOD PRESSURE CONTROL BOOK
Freq: Once | Status: AC
  Administered 2018-09-17: 12:00:00
  Filled 2018-09-17: qty 1

## 2018-09-17 MED ORDER — LIVING WELL WITH DIABETES BOOK
Freq: Once | Status: AC
  Administered 2018-09-17: 12:00:00
  Filled 2018-09-17: qty 1

## 2018-09-17 NOTE — Significant Event (Signed)
Hypoglycemic Event  CBG: 53  Treatment: 4 oz juice/soda  Symptoms: Hungry  Follow-up CBG: Time:1747 CBG Result:69  Possible Reasons for Event: Medication regimen:  and Change in activity  Comments/MD notified:ssi held and encouraged snack p therapy in the afternoon. Ate a good lunch    Chana Bode B

## 2018-09-17 NOTE — Progress Notes (Signed)
[place] PHYSICAL MEDICINE & REHABILITATION PROGRESS NOTE   Subjective/Complaints:  No issues overnite, some constipation but does not want laxative for fear of diarrhea, we discussed food choices, fruit vegetables and liquid  ROS- neg CP, SOB, N/V/D  Objective:   No results found. No results for input(s): WBC, HGB, HCT, PLT in the last 72 hours. No results for input(s): NA, K, CL, CO2, GLUCOSE, BUN, CREATININE, CALCIUM in the last 72 hours.  Intake/Output Summary (Last 24 hours) at 09/17/2018 0907 Last data filed at 09/17/2018 0838 Gross per 24 hour  Intake 840 ml  Output 350 ml  Net 490 ml     Physical Exam: Vital Signs Blood pressure 113/74, pulse 62, temperature (!) 97.3 F (36.3 C), temperature source Oral, resp. rate 19, height 5\' 6"  (1.676 m), weight 68.1 kg, SpO2 100 %.   General: No acute distress Left sclera injected esp lateral to pupil Mood and affect are appropriate Heart: bradycardia , normal rhythm no rubs murmurs or extra sounds Lungs: Clear to auscultation, breathing unlabored, no rales or wheezes Abdomen: Positive bowel sounds, soft nontender to palpation, nondistended Extremities: No clubbing, cyanosis, or edema Skin: No evidence of breakdown, no evidence of rash Neurologic: Cranial nerves II through XII intact, motor strength is 5/5 in RIght deltoid, bicep, tricep, grip, hip flexor, knee extensors, ankle dorsiflexor and plantar flexor, Trace horz adduction  LUE, 3- LLE Quad and hip add, 2-/5 Left ankle DF/PF, increase tone  Sensory exam normal sensation to light touch and proprioception in bilateral upper and lower extremities Cerebellar exam normal unable to do finger to nose to finger left upper     Musculoskeletal: Full range of motion in all 4 extremities. No joint swelling   Assessment/Plan: 1. 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  Physiatrist is providing close team supervision and 24 hour management of active medical problems listed below.  Physiatrist and rehab team continue to assess barriers to discharge/monitor patient progress toward functional and medical goals  Care Tool:  Bathing    Body parts bathed by patient: Left arm, Chest, Abdomen, Front perineal area, Right upper leg, Left upper leg, Face, Right lower leg   Body parts bathed by helper: Right arm, Buttocks, Left lower leg     Bathing assist Assist Level: Moderate Assistance - Patient 50 - 74%     Upper Body Dressing/Undressing Upper body dressing   What is the patient wearing?: Pull over shirt    Upper body assist Assist Level: Moderate Assistance - Patient 50 - 74%    Lower Body Dressing/Undressing Lower body dressing      What is the patient wearing?: Pants, Underwear/pull up     Lower body assist Assist for lower body dressing: Moderate Assistance - Patient 50 - 74%     Toileting Toileting    Toileting assist Assist for toileting: Maximal Assistance - Patient 25 - 49%     Transfers Chair/bed transfer  Transfers assist     Chair/bed transfer assist level: Minimal Assistance - Patient > 75%     Locomotion Ambulation   Ambulation assist      Assist level: Moderate Assistance - Patient 50 - 74% Assistive device: Walker-rolling Max distance: 10'   Walk 10 feet activity   Assist     Assist level: Moderate Assistance - Patient - 50 - 74% Assistive device: Walker-rolling   Walk 50  feet activity   Assist Walk 50 feet with 2 turns activity did not occur: Safety/medical concerns  Assist level: Moderate Assistance - Patient - 50 - 74% Assistive device: Walker-rolling, Orthosis    Walk 150 feet activity   Assist Walk 150 feet activity did not occur: Safety/medical concerns         Walk 10 feet on uneven surface  activity   Assist Walk 10 feet on uneven surfaces activity  did not occur: Safety/medical concerns         Wheelchair     Assist Will patient use wheelchair at discharge?: (TBD) Type of Wheelchair: Manual    Wheelchair assist level: Supervision/Verbal cueing Max wheelchair distance: 150    Wheelchair 50 feet with 2 turns activity    Assist        Assist Level: Supervision/Verbal cueing   Wheelchair 150 feet activity     Assist     Assist Level: Supervision/Verbal cueing    Medical Problem List and Plan: 1.   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  DVT Prophylaxis/Anticoagulation: subcutaneous Lovenox. Monitor for any bleeding episodes 3. Pain Management: Neurontin 600 mg 3 times a day reduced to twice daily given creatinine clearance,tramadol as needed             -support while bed/chair to reduce sublux 4. Mood:  Provide emotional support 5. Neuropsych: This patient is capable of making decisions on his own behalf. 6. Skin/Wound Care:  Routine skin checks 7. Fluids/Electrolytes/Nutrition:  Routine in and out's with follow-up chemistries 8. Dysphagia. Dysphagia #3 nectar thick liquids. Follow-up speech therapy, advance as tolerated 9. Diabetes mellitus with peripheral neuropathy. Hemoglobin A1c 10.3. Glucotrol 10 mg daily reduce to 5mg ,  Reduce levimir to 10mg  Qhs 1/31 Check blood sugars before meals and at bedtime. Diabetic teaching CBG (last 3)  Recent Labs    09/16/18 1624 09/16/18 2158 09/17/18 0652  GLUCAP 109* 128* 89  controlled may make further adjustments 10. Hypertension. Lisinopril 40 mg daily. Vitals:   09/16/18 2019 09/17/18 0513  BP: (!) 179/90 113/74  Pulse: (!) 45 62  Resp: 18 19  Temp: 98 F (36.7 C) (!) 97.3 F (36.3 C)  SpO2: 100% 100%  controlled 09/17/2018, ,bradycardia improved EKG 1/16 sinus  Brady , asymptomatic 11. Hyperlipidemia. Lipitor 12.  Tobacco as well as polysubstance abuse. NicoDerm patch. Provide counseling     LOS: 11 days A FACE TO FACE EVALUATION WAS PERFORMED  Erick Colace 09/17/2018, 9:07 AM

## 2018-09-17 NOTE — Significant Event (Signed)
Hypoglycemic Event  CBG: 69  Treatment: 4 oz juice/soda  Symptoms: None  Follow-up CBG: Time:1800 CBG Result:97  Possible Reasons for Event: Inadequate meal intake  Comments/MD notified:pt given small candy bar as CBG not raised with juice.    Pamelia Hoit

## 2018-09-18 ENCOUNTER — Inpatient Hospital Stay (HOSPITAL_COMMUNITY): Payer: Medicaid Other

## 2018-09-18 LAB — GLUCOSE, CAPILLARY
Glucose-Capillary: 70 mg/dL (ref 70–99)
Glucose-Capillary: 95 mg/dL (ref 70–99)
Glucose-Capillary: 98 mg/dL (ref 70–99)
Glucose-Capillary: 158 mg/dL — ABNORMAL HIGH (ref 70–99)
Glucose-Capillary: 190 mg/dL — ABNORMAL HIGH (ref 70–99)

## 2018-09-18 NOTE — Progress Notes (Signed)
Occupational Therapy Session Note  Patient Details  Name: Evan ANCHETA Sr. MRN: [id number] Date of Birth: 1956-06-27  Today's Date: 09/18/2018 OT Individual Time: 1300-1345 OT Individual Time Calculation (min): 45 min    Short Term Goals: Week 2:  OT Short Term Goal 1 (Week 2): Pt will perform toilet transfers with min A and min verbal cues for safety OT Short Term Goal 2 (Week 2): Pt will perform toileting tasks with mod A sit<>stand for clothing management OT Short Term Goal 3 (Week 2): Pt will don underpants and pant with min A OT Short Term Goal 4 (Week 2): Pt will don shoes and fasten with shoe buttrons with min A  Skilled Therapeutic Interventions/Progress Updates:    Pt requested to go outside for treatment session. Pt completed SPT to w/c with min A. Pt was transported outside with total A for time management. Edu provided along the way re community accessibility. While pt sat and enjoyed weather, PROM performed to L UE to maintain muscle elasticity and length. Pt edu on importance of performing self PROM as slight tone was found in fingers and biceps. Pt returned inside and completed functional standing/throwing activity to address dynamic standing balance. Min-mod A provided throughout. Pt returned to room and transferred to supine with min A. Pt left with bed alarm set and all needs met.   Therapy Documentation Precautions:  Precautions Precautions: Fall Restrictions Weight Bearing Restrictions: No Vital Signs: Therapy Vitals Temp: (!) 97.4 F (36.3 C) Pulse Rate: 71 Resp: 18 BP: 111/72 Patient Position (if appropriate): Sitting Oxygen Therapy SpO2: 97 % O2 Device: Room Air Pain: Pain Assessment Pain Scale: 0-10 Pain Score: 0-No pain   Therapy/Group: Individual Therapy  Curtis Sites 09/18/2018, 4:50 PM

## 2018-09-18 NOTE — Progress Notes (Signed)
Physical Therapy Session Note  Patient Details  Name: Evan KISSLER Sr. MRN: [id number] Date of Birth: [birth year]  Today's Date: 09/18/2018 PT Individual Time: 0802-0900 PT Individual Time Calculation (min): 58 min   Short Term Goals: Week 2:  PT Short Term Goal 1 (Week 2): STG=LTGs due to ELOS  Skilled Therapeutic Interventions/Progress Updates:    Pt seated in w/c upon PT arrival, agreeable to therapy tx and denies pain. Pt seated in w/c donned socks, shoes and L AFO with assist. Pr propelled w/c to the gym with supervision using R hemi technique. Pt ambulated x 80 ft this session with RW and min assist this session, verbal cues for step length, foot placement, RW management and increased cues for techniques with turning. Pt performed x 10 sit<>stands from mat without UE support for LE strengthening and postural control, CGA. Pt worked on dynamic standing balance and L knee control to perform toe taps to 2 inch step, no UE support with min-mod assist for balance and x 2 left anteriolateral LOB during this. Pt transferred to tall kneeling on mat with mod assist. In tall kneeling pt worked on postural control and hip strength without UE support to perform reaching task, tactile cues for hip extension on L. Pt performed tall kneeling hip flexion<>extensions bringing buttocks back to heels x 10 for hip strength and neuro re-ed. Pt transferred to modified quadruped on elbows using bench for weightbearing through L side, performed reaching task with R UE in this position and performed x 10 R LE hip extensions in this position. Pt transferred to quadruped on mat with therapist providing stabilization and support to L UE for elbow extension, pt performed bean bag reaching task in this position working on L UE weightbearing and core strengthening. Pt transferred to sitting with min assist. Pt ambulated x 80 ft this session with RW and min assist this session, mirror for visual feedback for RW management. Pt  transported back to room and transferred to recliner with min assist, left with needs in reach and chair alarm set.   Therapy Documentation Precautions:  Precautions Precautions: Fall Restrictions Weight Bearing Restrictions: No   Therapy/Group: Individual Therapy  Mindi Curling Schagen 09/18/2018, 7:50 AM

## 2018-09-19 ENCOUNTER — Inpatient Hospital Stay (HOSPITAL_COMMUNITY): Payer: Self-pay

## 2018-09-19 ENCOUNTER — Inpatient Hospital Stay (HOSPITAL_COMMUNITY): Payer: Medicaid Other

## 2018-09-19 ENCOUNTER — Inpatient Hospital Stay (HOSPITAL_COMMUNITY): Payer: Medicaid Other | Admitting: Speech Pathology

## 2018-09-19 LAB — GLUCOSE, CAPILLARY
Glucose-Capillary: 131 mg/dL — ABNORMAL HIGH (ref 70–99)
Glucose-Capillary: 113 mg/dL — ABNORMAL HIGH (ref 70–99)
Glucose-Capillary: 123 mg/dL — ABNORMAL HIGH (ref 70–99)
Glucose-Capillary: 91 mg/dL (ref 70–99)

## 2018-09-19 NOTE — Progress Notes (Signed)
[place] PHYSICAL MEDICINE & REHABILITATION PROGRESS NOTE   Subjective/Complaints:  Patient without new complaints overnight.  ROS- neg CP, SOB, N/V/D  Objective:   No results found. No results for input(s): WBC, HGB, HCT, PLT in the last 72 hours. No results for input(s): NA, K, CL, CO2, GLUCOSE, BUN, CREATININE, CALCIUM in the last 72 hours.  Intake/Output Summary (Last 24 hours) at 09/19/2018 1552 Last data filed at 09/18/2018 1842 Gross per 24 hour  Intake 360 ml  Output -  Net 360 ml     Physical Exam: Vital Signs Blood pressure 114/72, pulse 70, temperature 98.2 F (36.8 C), temperature source Oral, resp. rate 20, height 5\' 6"  (1.676 m), weight 65.7 kg, SpO2 100 %.   General: No acute distress Left sclera injected esp lateral to pupil Mood and affect are appropriate Heart: bradycardia , normal rhythm no rubs murmurs or extra sounds Lungs: Clear to auscultation, breathing unlabored, no rales or wheezes Abdomen: Positive bowel sounds, soft nontender to palpation, nondistended Extremities: No clubbing, cyanosis, or edema Skin: No evidence of breakdown, no evidence of rash Neurologic: Cranial nerves II through XII intact, motor strength is 5/5 in RIght deltoid, bicep, tricep, grip, hip flexor, knee extensors, ankle dorsiflexor and plantar flexor, Trace horz adduction  LUE, 3- LLE Quad and hip add, 2-/5 Left ankle DF/PF, increase tone  Sensory exam normal sensation to light touch and proprioception in bilateral upper and lower extremities    Musculoskeletal: Full range of motion in all 4 extremities. No joint swelling   Assessment/Plan: 1. 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  Physiatrist is providing close team supervision and 24 hour management of active medical problems listed below.  Physiatrist and rehab team continue to assess  barriers to discharge/monitor patient progress toward functional and medical goals  Care Tool:  Bathing    Body parts bathed by patient: Left arm, Chest, Abdomen, Front perineal area, Right upper leg, Left upper leg, Face   Body parts bathed by helper: Left arm, Buttocks, Right lower leg, Left lower leg     Bathing assist Assist Level: Moderate Assistance - Patient 50 - 74%     Upper Body Dressing/Undressing Upper body dressing   What is the patient wearing?: Pull over shirt    Upper body assist Assist Level: Supervision/Verbal cueing    Lower Body Dressing/Undressing Lower body dressing      What is the patient wearing?: Underwear/pull up, Pants     Lower body assist Assist for lower body dressing: Moderate Assistance - Patient 50 - 74%     Toileting Toileting    Toileting assist Assist for toileting: Moderate Assistance - Patient 50 - 74%     Transfers Chair/bed transfer  Transfers assist     Chair/bed transfer assist level: Minimal Assistance - Patient > 75%     Locomotion Ambulation   Ambulation assist      Assist level: Minimal Assistance - Patient > 75% Assistive device: Walker-rolling Max distance: 80 ft   Walk 10 feet activity   Assist     Assist level: Minimal Assistance - Patient > 75% Assistive device: Walker-rolling   Walk 50 feet activity   Assist Walk 50 feet with 2 turns activity did not occur: Safety/medical concerns  Assist level: Minimal Assistance - Patient > 75% Assistive device: Walker-rolling, Orthosis    Walk 150 feet activity   Assist  Walk 150 feet activity did not occur: Safety/medical concerns         Walk 10 feet on uneven surface  activity   Assist Walk 10 feet on uneven surfaces activity did not occur: Safety/medical concerns         Wheelchair     Assist Will patient use wheelchair at discharge?: (TBD) Type of Wheelchair: Manual    Wheelchair assist level: Supervision/Verbal  cueing Max wheelchair distance: 150    Wheelchair 50 feet with 2 turns activity    Assist        Assist Level: Supervision/Verbal cueing   Wheelchair 150 feet activity     Assist     Assist Level: Supervision/Verbal cueing    Medical Problem List and Plan: 1.   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  DVT Prophylaxis/Anticoagulation: subcutaneous Lovenox. Monitor for any bleeding episodes 3. Pain Management: Neurontin 600 mg 3 times a day reduced to twice daily given creatinine clearance,tramadol as needed             -support while bed/chair to reduce sublux 4. Mood:  Provide emotional support 5. Neuropsych: This patient is capable of making decisions on his own behalf. 6. Skin/Wound Care:  Routine skin checks 7. Fluids/Electrolytes/Nutrition:  Routine in and out's with follow-up chemistries 8. Dysphagia. Dysphagia #3 nectar thick liquids. Follow-up speech therapy, advance as tolerated 9. Diabetes mellitus with peripheral neuropathy. Hemoglobin A1c 10.3. Glucotrol 10 mg daily reduce to 5mg ,  Reduce levimir to 10mg  Qhs 1/31 Check blood sugars before meals and at bedtime. Diabetic teaching CBG (last 3)  Recent Labs    09/18/18 2104 09/19/18 0625 09/19/18 1205  GLUCAP 190* 131* 113*  controlled, 09/19/2018 10. Hypertension. Lisinopril 40 mg daily. Vitals:   09/19/18 1132 09/19/18 1508  BP: 107/71 114/72  Pulse: (!) 53 70  Resp:  20  Temp: 97.7 F (36.5 C) 98.2 F (36.8 C)  SpO2: 100% 100%  controlled 09/19/2018, EKG 1/16 sinus  Huston FoleyBrady , asymptomatic 11. Hyperlipidemia. Lipitor 12. Tobacco as well as polysubstance abuse. NicoDerm patch. Provide counseling     LOS: 13 days A FACE TO FACE EVALUATION WAS PERFORMED  Erick Colacendrew E Kirsteins 09/19/2018, 3:52 PM

## 2018-09-19 NOTE — Progress Notes (Signed)
Speech Language Pathology Daily Session Note  Patient Details  Name: Evan AnoDuncan E Burggraf Sr. MRN: [id number] Date of Birth: 09/22/1955  Today's Date: 09/19/2018 SLP Individual Time: 0830-0925 SLP Individual Time Calculation (min): 55 min  Short Term Goals: Week 2: SLP Short Term Goal 1 (Week 2): Patient will consume current diet with minimal overt s/s of aspiration with supervision verbal cues for use of swallowing compensatory strategies.  SLP Short Term Goal 2 (Week 2): Patient will utilize speech intelligibility strategies at the sentence level with Mod A verbal cues to achieve ~90% intelligibility.  SLP Short Term Goal 3 (Week 2): Patient will self-monitor and correct errors during functional tasks with Min A verbal cues.  Skilled Therapeutic Interventions: Skilled treatment session focused on dysphagia and cognitive goals. SLP facilitated session by providing set-up assist for oral care via the suction toothbrush. Patient consumed trials of thin liquids with minimal overt s/s of aspiration. However, only small, controlled sips administered in order to maximize safety while focusing on oral holding/control and hard, effortful swallows. Patient also performed 25 repetitions of IMST with supervision verbal cues for accuracy. Patient participated in a verbal description task and required overall Min A verbal cues for ~80% intelligibility at the sentence level. Patient transferred back to bed at end of session. Patient left upright in bed with alarm on and all needs within reach. Continue with current plan of care.      Pain No/Denies Pain   Therapy/Group: Individual Therapy  Evan Townsend 09/19/2018, 10:04 AM

## 2018-09-19 NOTE — Progress Notes (Signed)
Physical Therapy Session Note  Patient Details  Name: Evan DOAR Sr. MRN: [id number] Date of Birth: [birth year]  Today's Date: 09/19/2018 PT Individual Time: 0109-3235 PT Individual Time Calculation (min): 70 min   Short Term Goals: Week 2:  PT Short Term Goal 1 (Week 2): STG=LTGs due to ELOS  Skilled Therapeutic Interventions/Progress Updates:    Pt supine in bed upon PT arrival, agreeable to therapy tx and denies pain. Pt transferred to sitting with min assist and donned shoes and L AFO. Pt transferred to w/c with min assist and propelled w/c x 200 ft with supervision. Pt requesting to go outside this session. Pt propelled w/c on unlevel surfaces x 50 ft with supervision. Pt performed 2 x 10 sit<>stands and 2 x 10 mini squats from w/c working on LE strength and knee control, manual facilitation for symmetric LE weightbearing. Pt worked on L knee/stance control to perform forward/backard stepping in place with R LE and to perform side stepping in place with R LE, min-mod assist and no UE support. Pt ambulated 2 x 30 ft this session while outside on uneven surfaces with RW and min assist, verbal cues for step length and sequencing. Pt worked on standing balance and hip strength to perform x 10 hip flexion with L LE in standing with RW. Pt propelled w/c back to unit x 200 ft with supervision. Pt ambulated x 80 ft with RW and min assist, increased time secondary to decreased gait speed, pt with step to pattern, verbal cues for RW management, step length and manual facilitation for lateral weightshifting. Pt transported back to room, stand pivot to bed using bed rail and supervision. Pt transferred to supine with supervision and left with needs in bed alarm set.   Therapy Documentation Precautions:  Precautions Precautions: Fall Restrictions Weight Bearing Restrictions: No   Therapy/Group: Individual Therapy  Cresenciano Genre, PT, DPT 09/19/2018, 7:49 AM

## 2018-09-19 NOTE — Progress Notes (Signed)
Occupational Therapy Session Note  Patient Details  Name: Evan LENHARD Sr. MRN: [id number] Date of Birth: November 13, 1955  Today's Date: 09/19/2018 OT Individual Time: 0700-0825 OT Individual Time Calculation (min): 85 min    Short Term Goals: Week 2:  OT Short Term Goal 1 (Week 2): Pt will perform toilet transfers with min A and min verbal cues for safety OT Short Term Goal 2 (Week 2): Pt will perform toileting tasks with mod A sit<>stand for clothing management OT Short Term Goal 3 (Week 2): Pt will don underpants and pant with min A OT Short Term Goal 4 (Week 2): Pt will don shoes and fasten with shoe buttrons with min A  Skilled Therapeutic Interventions/Progress Updates:    OT intervention with focus on bed mobility, BADL retraining, sit<>stand, standing balance, functional transfers, LUE use, and safety awareness to increase independence with BADLs.  See below for ADL assist levels.  Pt continues to require min verbal cues for LLE placement in preparation for sit<>stand.  Pt requires CGA for standing balance after verbal cues for placement of LLE for balance.  Pt recalls and utilizes hemi dressing techniques but requires occasional assistance to complete task.  Pt requires min A for stand pivot transfers.  Pt remained seated in w/c with all needs within reach and belt alarm activated.   Therapy Documentation Precautions:  Precautions Precautions: Fall Restrictions Weight Bearing Restrictions: No   Pain:  Pt denies pain this morning ADL: ADL Eating: Supervision/safety Where Assessed-Eating: Wheelchair Grooming: Contact guard Where Assessed-Grooming: Sitting at sink Upper Body Bathing: Minimal assistance Where Assessed-Upper Body Bathing: Shower Lower Body Bathing: Minimal assistance Where Assessed-Lower Body Bathing: Shower Upper Body Dressing: Supervision/safety Where Assessed-Upper Body Dressing: Sitting at sink, Wheelchair Lower Body Dressing: Moderate assistance Where  Assessed-Lower Body Dressing: Sitting at sink, Wheelchair, Standing at sink [employer]: Insurance underwriter Method: Warden/ranger: Grab bars, Transfer tub bench   Therapy/Group: Individual Therapy  Rich Brave 09/19/2018, 8:27 AM

## 2018-09-20 ENCOUNTER — Inpatient Hospital Stay (HOSPITAL_COMMUNITY): Payer: Self-pay

## 2018-09-20 ENCOUNTER — Inpatient Hospital Stay (HOSPITAL_COMMUNITY): Payer: Medicaid Other | Admitting: Speech Pathology

## 2018-09-20 ENCOUNTER — Inpatient Hospital Stay (HOSPITAL_COMMUNITY): Payer: Medicaid Other

## 2018-09-20 LAB — GLUCOSE, CAPILLARY
Glucose-Capillary: 94 mg/dL (ref 70–99)
Glucose-Capillary: 109 mg/dL — ABNORMAL HIGH (ref 70–99)
Glucose-Capillary: 134 mg/dL — ABNORMAL HIGH (ref 70–99)
Glucose-Capillary: 161 mg/dL — ABNORMAL HIGH (ref 70–99)

## 2018-09-20 LAB — CREATININE, SERUM
Creatinine, Ser: 1.39 mg/dL — ABNORMAL HIGH (ref 0.61–1.24)
GFR calc Af Amer: >60 mL/min (ref >60)
GFR calc non Af Amer: 54 mL/min — ABNORMAL LOW (ref >60)

## 2018-09-20 NOTE — Progress Notes (Signed)
Speech Language Pathology Daily Session Note  Patient Details  Name: Evan Townsend. MRN: [id number] Date of Birth: 12-20-1955  Today's Date: 09/20/2018 SLP Individual Time: 3545-6256 SLP Individual Time Calculation (min): 55 min  Short Term Goals: Week 2: SLP Short Term Goal 1 (Week 2): Patient will consume current diet with minimal overt s/s of aspiration with supervision verbal cues for use of swallowing compensatory strategies.  SLP Short Term Goal 2 (Week 2): Patient will utilize speech intelligibility strategies at the sentence level with Mod A verbal cues to achieve ~90% intelligibility.  SLP Short Term Goal 3 (Week 2): Patient will self-monitor and correct errors during functional tasks with Min A verbal cues.  Skilled Therapeutic Interventions: Skilled treatment session focused on speech and dysphagia goals. SLP facilitated session by providing Min A verbal cues for use of speech intelligibility strategies at the phrase and sentence level during a structured verbal task. SLP also facilitated session by providing set-up assist of the suction toothbrush for oral care prior to trials of thin liquids. Patient's swallowing initiation appeared more timely with trials of thin liquids via cup with minimal overt s/s of aspiration observed with controlled trials of 10 cc. However, when amount increased, increased coughing noted. Recommend repeat MBS to assess swallow function and progress due to h/o of silent aspiration. Patient left upright in wheelchair with all needs within reach and alarm on. Continue with current plan of care.      Pain No/Denies Pain   Therapy/Group: Individual Therapy  Jamieka Royle 09/20/2018, 10:30 AM

## 2018-09-20 NOTE — Progress Notes (Signed)
Occupational Therapy Weekly Progress Note  Patient Details  Name: Evan PANAS Sr. MRN: [id number] Date of Birth: [birth year]  Beginning of progress report period: September 13, 2018 End of progress report period: September 20, 2018  Patient has met 3 of 4 short term goals. Pt continues to make steady progress with BADLs and functional transfers.  Pt completes bathing tasks with min A at shower level.  Pt performs all functional transfers with CGA and min verbal cues for safety.  Pt requires CGA for standing balance to pull pants over hips.  Pt now has an AFO for LLE and requires assistance donning and fastening shoe. Family has not been in for education but is scheduled for 2/6.  Patient continues to demonstrate the following deficits: muscle weakness, decreased cardiorespiratoy endurance, impaired timing and sequencing, unbalanced muscle activation, decreased coordination and decreased motor planning, decreased attention, decreased problem solving, decreased safety awareness and decreased memory and decreased sitting balance, decreased standing balance, decreased postural control, hemiplegia and decreased balance strategies and therefore will continue to benefit from skilled OT intervention to enhance overall performance with BADL and Reduce care partner burden.  Patient progressing toward long term goals..  Continue plan of care.  OT Short Term Goals Week 2:  OT Short Term Goal 1 (Week 2): Pt will perform toilet transfers with min A and min verbal cues for safety OT Short Term Goal 1 - Progress (Week 2): Met OT Short Term Goal 2 (Week 2): Pt will perform toileting tasks with mod A sit<>stand for clothing management OT Short Term Goal 2 - Progress (Week 2): Met OT Short Term Goal 3 (Week 2): Pt will don underpants and pant with min A OT Short Term Goal 3 - Progress (Week 2): Met OT Short Term Goal 4 (Week 2): Pt will don shoes and fasten with shoe buttrons with min A OT Short Term Goal 4 -  Progress (Week 2): Discontinued (Pt will need assist with AFO) Week 3:  OT Short Term Goal 1 (Week 3): STG=LTG secondary to ELOS    Therapy Documentation Precautions:  Precautions Precautions: Fall Restrictions Weight Bearing Restrictions: No  Leroy Libman 09/20/2018, 6:52 AM

## 2018-09-20 NOTE — Progress Notes (Signed)
Physical Therapy Session Note  Patient Details  Name: Evan MCQUILLIN Sr. MRN: [id number] Date of Birth: 1956/05/12  Today's Date: 09/20/2018 PT Individual Time: 1310-1420 PT Individual Time Calculation (min): 70 min   Short Term Goals: Week 2:  PT Short Term Goal 1 (Week 2): STG=LTGs due to ELOS  Skilled Therapeutic Interventions/Progress Updates:    Pt supine in bed upon PT arrival, agreeable to therapy tx and denies pain. Pt transferred to sitting with supervision and performed stand pivot to w/c with min assist. Pt transported to the gym. Pt performed stand pivot to mat with CGA. Pt performed sit<>stands from mat x 10 with min guard assist working on LE strengthening. Pt performed sidestepping with UE support on therapist shoulders 2 x 6 ft in each direction with min assist. Pt ambulated x 65 ft with RW and min assist, verbal cues for symmetric step length and RW management during turns. Pt transported outside. Pt ambulated x 100 ft on unlevel surfaces this session with min assist, verbal cues for step length and L knee control. Pt worked on turns this session, pt performed 360 degrees turns x 2 in each direction with min assist and RW, cues for foot placement and sequencing. Pt worked on forwards/backwards ambulation 10 ft in each direction x 3 with RW and min assist, cues for foot placement and step length. Pt propelled w/c back to unit with supervision x 250 ft using R hemi technique. Pt ambulated back to room x 100 ft with RW and min assist, transferred to bed and left with needs in reach and bed alarm set.   Therapy Documentation Precautions:  Precautions Precautions: Fall Restrictions Weight Bearing Restrictions: No    Therapy/Group: Individual Therapy  Cresenciano Genre, PT, DPT 09/20/2018, 9:38 AM

## 2018-09-20 NOTE — Plan of Care (Signed)
  Problem: Consults Goal: RH STROKE PATIENT EDUCATION Description See Patient Education module for education specifics  Outcome: Progressing Goal: Diabetes Guidelines if Diabetic/Glucose > 140 Description If diabetic or lab glucose is > 140 mg/dl - Initiate Diabetes/Hyperglycemia Guidelines & Document Interventions  Outcome: Progressing   Problem: RH BOWEL ELIMINATION Goal: RH STG MANAGE BOWEL WITH ASSISTANCE Description STG Manage Bowel with min Assistance.  Outcome: Progressing   Problem: RH BLADDER ELIMINATION Goal: RH STG MANAGE BLADDER WITH ASSISTANCE Description STG Manage Bladder With min Assistance  Outcome: Progressing   Problem: RH SKIN INTEGRITY Goal: RH STG MAINTAIN SKIN INTEGRITY WITH ASSISTANCE Description STG Maintain Skin Integrity With min Assistance.  Outcome: Progressing   Problem: RH SAFETY Goal: RH STG ADHERE TO SAFETY PRECAUTIONS W/ASSISTANCE/DEVICE Description STG Adhere to Safety Precautions With min Assistance and appropriate assistive Device.  Outcome: Progressing   Problem: RH PAIN MANAGEMENT Goal: RH STG PAIN MANAGED AT OR BELOW PT'S PAIN GOAL Description <3 on a 0-10 pain scale  Outcome: Progressing   Problem: RH KNOWLEDGE DEFICIT Goal: RH STG INCREASE KNOWLEDGE OF DIABETES Description Patient will demonstrate knowledge of diabetes medications, dietary restrictions, and how to manage hypo/hyperglycemic episodes. Follow up care with the doctor post discharge with min assist from staff.  Outcome: Progressing Goal: RH STG INCREASE KNOWLEDGE OF HYPERTENSION Description Patient will demonstrate knowledge of HTN medications, dietary restrictions, BP parameters, and follow up care with the MD post discharge with min assist from staff.  Outcome: Progressing Goal: RH STG INCREASE KNOWLEDGE OF DYSPHAGIA/FLUID INTAKE Description Patient will demonstrate knowledge of proper swallowing techniques, managing fluid intake with thickened fluids, and  follow up care with the MD post discharge with min assist from staff.  Outcome: Progressing Goal: RH STG INCREASE KNOWLEGDE OF HYPERLIPIDEMIA Description Patient will demonstrate knowledge of HLD medications, dietary restrictions, and follow up care with the MD post discharge with min assist from staff.  Outcome: Progressing Goal: RH STG INCREASE KNOWLEDGE OF STROKE PROPHYLAXIS Description Patient will demonstrate knowledge of stroke prophylactic medications and how they are used to help prevent future strokes. Follow up care with the MD post discharge with min assist from staff.   Outcome: Progressing

## 2018-09-20 NOTE — Progress Notes (Signed)
Occupational Therapy Session Note  Patient Details  Name: Evan Townsend. MRN: [id number] Date of Birth: 1955/09/04  Today's Date: 09/20/2018 OT Individual Time: 5852-7782 OT Individual Time Calculation (min): 74 min    Short Term Goals: Week 3:  OT Short Term Goal 1 (Week 3): STG=LTG secondary to ELOS  Skilled Therapeutic Interventions/Progress Updates:    OT intervention with focus on bed mobility, sit<>stand, standing balance, functional transfers, functional amb with RW to access bathroom, BADL retraining, safety awareness, and activity tolerance to increase independence with BADLs. See below for BADL assist levels.  Pt sat EOB and performed sit<>stand at supervision level.  Pt performed tub bench transfer in tub room with CGA, amb with RW approx 5' to access tub bench.  Pt continues to require min verbal cues for safety awareness with sit<>stand and during amb. Pt incorporates hemi dressing techniques independently. Pt requires CGA for standing balance to pull pants over hips. Pt remained in w/c with all needs within reach and belt alarm activated.   Therapy Documentation Precautions:  Precautions Precautions: Fall Restrictions Weight Bearing Restrictions: No   Pain:  Pt denies pain this morning ADL: ADL Upper Body Bathing: Minimal assistance Where Assessed-Upper Body Bathing: Shower Lower Body Bathing: Minimal assistance Where Assessed-Lower Body Bathing: Shower Upper Body Dressing: Supervision/safety Where Assessed-Upper Body Dressing: Sitting at sink, Wheelchair Lower Body Dressing: Moderate assistance Where Assessed-Lower Body Dressing: Sitting at sink, Wheelchair, Standing at sink [employer]: Insurance underwriter Method: Warden/ranger: Grab bars, Transfer tub bench   Therapy/Group: Individual Therapy  Rich Brave 09/20/2018, 8:15 AM

## 2018-09-20 NOTE — Progress Notes (Signed)
Evan Townsend PHYSICAL MEDICINE & REHABILITATION PROGRESS NOTE   Subjective/Complaints:  No issues overnite, per SLP swallow is better  ROS- neg CP, SOB, N/V/D  Objective:   No results found. No results for input(s): WBC, HGB, HCT, PLT in the last 72 hours. Recent Labs    09/20/18 0629  CREATININE 1.39*   No intake or output data in the 24 hours ending 09/20/18 [id number]   Physical Exam: Vital Signs Blood pressure 135/85, pulse 88, temperature 98.5 F (36.9 C), temperature source Oral, resp. rate 19, height 5\' 6"  (1.676 m), weight 65.7 kg, SpO2 100 %.   General: No acute distress Left sclera injected esp lateral to pupil Mood and affect are appropriate Heart: bradycardia , normal rhythm no rubs murmurs or extra sounds Lungs: Clear to auscultation, breathing unlabored, no rales or wheezes Abdomen: Positive bowel sounds, soft nontender to palpation, nondistended Extremities: No clubbing, cyanosis, or edema Skin: No evidence of breakdown, no evidence of rash Neurologic: Cranial nerves II through XII intact, motor strength is 5/5 in RIght deltoid, bicep, tricep, grip, hip flexor, knee extensors, ankle dorsiflexor and plantar flexor, Trace horz adduction  LUE, 3- LLE Quad and hip add, 2-/5 Left ankle DF/PF, increase tone  Sensory exam normal sensation to light touch and proprioception in bilateral upper and lower extremities    Musculoskeletal: Full range of motion in all 4 extremities. No joint swelling   Assessment/Plan: 1. 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  Physiatrist is providing close team supervision and 24 hour management of active medical problems listed below.  Physiatrist and rehab team continue to assess barriers to discharge/monitor patient progress toward functional and medical goals  Care Tool:  Bathing    Body parts bathed  by patient: Left arm, Chest, Abdomen, Front perineal area, Right upper leg, Left upper leg, Face, Right lower leg, Left lower leg   Body parts bathed by helper: Left arm, Buttocks, Right lower leg, Left lower leg     Bathing assist Assist Level: Minimal Assistance - Patient > 75%     Upper Body Dressing/Undressing Upper body dressing   What is the patient wearing?: Pull over shirt    Upper body assist Assist Level: Supervision/Verbal cueing    Lower Body Dressing/Undressing Lower body dressing      What is the patient wearing?: Underwear/pull up, Pants     Lower body assist Assist for lower body dressing: Minimal Assistance - Patient > 75%     Toileting Toileting    Toileting assist Assist for toileting: Moderate Assistance - Patient 50 - 74%     Transfers Chair/bed transfer  Transfers assist     Chair/bed transfer assist level: Minimal Assistance - Patient > 75%     Locomotion Ambulation   Ambulation assist      Assist level: Minimal Assistance - Patient > 75% Assistive device: Walker-rolling Max distance: 80 ft   Walk 10 feet activity   Assist     Assist level: Minimal Assistance - Patient > 75% Assistive device: Walker-rolling   Walk 50 feet activity   Assist Walk 50 feet with 2 turns activity did not occur: Safety/medical concerns  Assist level: Minimal Assistance - Patient > 75% Assistive device: Walker-rolling, Orthosis    Walk 150 feet activity   Assist Walk 150 feet activity did not occur: Safety/medical concerns         Walk  10 feet on uneven surface  activity   Assist Walk 10 feet on uneven surfaces activity did not occur: Safety/medical concerns         Wheelchair     Assist Will patient use wheelchair at discharge?: (TBD) Type of Wheelchair: Manual    Wheelchair assist level: Supervision/Verbal cueing Max wheelchair distance: 150    Wheelchair 50 feet with 2 turns activity    Assist         Assist Level: Supervision/Verbal cueing   Wheelchair 150 feet activity     Assist     Assist Level: Supervision/Verbal cueing    Medical Problem List and Plan: 1.   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  DVT Prophylaxis/Anticoagulation: subcutaneous Lovenox. Monitor for any bleeding episodes 3. Pain Management: Neurontin 600 mg 3 times a day reduced to twice daily given creatinine clearance,tramadol as needed             -support while bed/chair to reduce sublux 4. Mood:  Provide emotional support 5. Neuropsych: This patient is capable of making decisions on his own behalf. 6. Skin/Wound Care:  Routine skin checks 7. Fluids/Electrolytes/Nutrition:  Routine in and out's with follow-up chemistries 8. Dysphagia. Dysphagia #3 nectar thick liquids. Follow-up speech therapy, advance as tolerated, improving per SLP will recheck MBS in am 9. Diabetes mellitus with peripheral neuropathy. Hemoglobin A1c 10.3. Glucotrol 10 mg daily reduce to 5mg ,  Reduce levimir to 10mg  Qhs 1/31 Check blood sugars before meals and at bedtime. Diabetic teaching CBG (last 3)  Recent Labs    09/19/18 1651 09/19/18 2131 09/20/18 0653  GLUCAP 123* 91 94  controlled, 09/19/2018 10. Hypertension. Lisinopril 40 mg daily. Vitals:   09/19/18 1926 09/20/18 0604  BP: 120/66 135/85  Pulse: (!) 56 88  Resp: 17 19  Temp: 98 F (36.7 C) 98.5 F (36.9 C)  SpO2: 99% 100%  controlled 09/20/2018, EKG 1/16 sinus  Brady intermittent 11. Hyperlipidemia. Lipitor 12. Tobacco as well as polysubstance abuse. NicoDerm patch. Provide counseling     LOS: 14 days A FACE TO FACE EVALUATION WAS PERFORMED  Evan Townsend 09/20/2018, 9:18 AM

## 2018-09-21 ENCOUNTER — Encounter (HOSPITAL_COMMUNITY): Payer: Medicaid Other | Admitting: Speech Pathology

## 2018-09-21 ENCOUNTER — Inpatient Hospital Stay (HOSPITAL_COMMUNITY): Payer: Medicaid Other

## 2018-09-21 ENCOUNTER — Inpatient Hospital Stay (HOSPITAL_COMMUNITY): Payer: Self-pay

## 2018-09-21 ENCOUNTER — Inpatient Hospital Stay (HOSPITAL_COMMUNITY): Payer: Medicaid Other | Admitting: Speech Pathology

## 2018-09-21 LAB — GLUCOSE, CAPILLARY
Glucose-Capillary: 113 mg/dL — ABNORMAL HIGH (ref 70–99)
Glucose-Capillary: 136 mg/dL — ABNORMAL HIGH (ref 70–99)
Glucose-Capillary: 129 mg/dL — ABNORMAL HIGH (ref 70–99)
Glucose-Capillary: 178 mg/dL — ABNORMAL HIGH (ref 70–99)

## 2018-09-21 NOTE — Progress Notes (Signed)
Modified Barium Swallow Progress Note  Patient Details  Name: [name] MRN: [id number] Date of Birth: 01/27/1956  Today's Date: 09/21/2018  Modified Barium Swallow completed.  Full report located under Chart Review in the Imaging Section.  Brief recommendations include the following:  Clinical Impression  Patient continues to demonstrate a moderate sensorimotor based oropharyngeal dysphagia. Oral phase is characterized by left anterior spillage due to oral motor weakness and decreased bolus cohesion resulting in premature spillage of liquids. Patient consistently initiates his swallow at the pyriform sinuses resulting in both intermittent sensed and silent aspiration of thin liquids. Patient's sensation appears to be improving with increased ability to "feel" aspiration. Compensatory strategies such as postural changes and bolus size/administration were ineffective in reducing aspiration episodes consistently.   Patient's pharyngeal strength appeared [facility] for all consistencies tested. Recommend patient continue current diet of Dys. 3 textures with nectar-thick liquids and continue the water protocol.  Patient educated in regards to results of MBS and verbalized understanding but will need reinforcement.   Swallow Evaluation Recommendations       SLP Diet Recommendations: Dysphagia 3 (Mech soft) solids;Nectar thick liquid;Free water protocol after oral care   Liquid Administration via: Cup   Medication Administration: Whole meds with puree   Supervision: Patient able to self feed;Intermittent supervision to cue for compensatory strategies   Compensations: Minimize environmental distractions;Slow rate;Small sips/bites;Lingual sweep for clearance of pocketing;Follow solids with liquid       Oral Care Recommendations: Oral care BID   Other Recommendations: Order thickener from pharmacy;Prohibited food (jello, ice cream, thin soups);Remove water pitcher;Have oral suction  available    Goran Olden 09/21/2018,3:19 PM

## 2018-09-21 NOTE — Patient Care Conference (Signed)
Inpatient RehabilitationTeam Conference and Plan of Care Update Date: 09/21/2018   Time: 11:00 Am    Patient Name: Evan LUKASZEWICZ Sr.      Medical Record Number: [id number]  Date of Birth: 1956-01-09 Sex: Male         Room/Bed: 4W21C/4W21C-01 Payor Info: Payor: MEDICAID [place] / Plan: MEDICAID [id number] ACCESS / Product Type: *No Product type* /    Admitting Diagnosis: cva  Admit Date/Time:  09/06/2018  1:08 PM Admission Comments: No comment available   Primary Diagnosis:  Lacunar infarct, acute (HCC) Principal Problem: Lacunar infarct, acute [facility])  Patient Active Problem List   Diagnosis Date Noted  . Lacunar infarct, acute (HCC) 09/06/2018  . TIA (transient ischemic attack) 09/01/2018  . Left leg pain 01/13/2018  . CVA (cerebral vascular accident) (HCC) 09/10/2017  . Hyperlipidemia 02/11/2017  . Acute CVA (cerebrovascular accident) (HCC) 11/10/2016  . Essential hypertension 11/10/2016  . Diabetes (HCC) 11/10/2016    Expected Discharge Date: Expected Discharge Date: 09/24/18  Team Members Present: Physician leading conference: Dr. Claudette Laws Social Worker Present: Dossie Der, LCSW Nurse Present: Kennyth Arnold, RN PT Present: Woodfin Ganja, PT OT Present: Roney Mans, OT;Ardis Rowan, COTA SLP Present: Feliberto Gottron, SLP PPS Coordinator present : Fae Pippin     Current Status/Progress Goal Weekly Team Focus  Medical   Blood pressures creeping up, diabetic control improving, bradycardia  Reduce secondary stroke risk, reduce fall risk  Work on blood pressures, orthotic training   Bowel/Bladder   Pt is continent B/B. LBM 09/20/2018.  Maintain regular bowel pattern. Remain continent B/B.  Assist with toileting needs PRN.   Swallow/Nutrition/ Hydration   Dys. 3 textures with nectar-thick liquids, water protocol, Full supervcision   Mod I  Family Education    ADL's   bathing-min A; UB dressing-supervison/min A; LB dressing-min A/mod A; functional transfers-min A   min A/CGA overall  LUE NMR, BADL retraining, functional transfers, safety awareness, activity tolerance   Mobility   supervision bed mobility, min assist transfers and gait up to 100 ft with RW  supervision w/c, CGA gait  L LE NMR, gait, transfers, balance   Communication   Min A  Supervision  Family education    Safety/Cognition/ Behavioral Observations  Mod I  Mod I   family Education    Pain   No complaints of pain.  Remain pain free.  Assess pain Q shift and PRN.   Skin   No skin issues.  Maintain skin intergrity and prevent skin breakdown.  Assess skin Q shift and PRN.      *See Care Plan and progress notes for long and short-term goals.     Barriers to Discharge  Current Status/Progress Possible Resolutions Date Resolved   Physician    Medical stability     Progressing in therapy  Medication management, check orthostatic blood pressures      Nursing                  PT                    OT                  SLP                SW                Discharge Planning/Teaching Needs:  Wife to be here Thursday for family education and is aware of pt's  need for 24 hr care at discharge.      Team Discussion:  Progressing toward his goals of CGA-min level. MBS today no change still on Dys 3 nectar thick liquids and water protocol and will go home on this diet. Speech intelligibility improving. Balance better. Family education Friday with wife and prepare for DC Sat. Medically stable for DC then.  Revisions to Treatment Plan:  DC 2/8    Continued Need for Acute Rehabilitation Level of Care: The patient requires daily medical management by a physician with specialized training in physical medicine and rehabilitation for the following conditions: Daily direction of a multidisciplinary physical rehabilitation program to ensure safe treatment while eliciting the highest outcome that is of practical value to the patient.: Yes Daily medical management of patient stability for  increased activity during participation in an intensive rehabilitation regime.: Yes Daily analysis of laboratory values and/or radiology reports with any subsequent need for medication adjustment of medical intervention for : Neurological problems   I attest that I was present, lead the team conference, and concur with the assessment and plan of the team.   Lucy Chris 09/21/2018, 1:04 PM

## 2018-09-21 NOTE — Progress Notes (Signed)
[place] PHYSICAL MEDICINE & REHABILITATION PROGRESS NOTE   Subjective/Complaints:  Dark spot on tongue noticed by patient, no problem with swallowing no neck pain.  No tongue pain  ROS- neg CP, SOB, N/V/D  Objective:   No results found. No results for input(s): WBC, HGB, HCT, PLT in the last 72 hours. Recent Labs    09/20/18 0629  CREATININE 1.39*    Intake/Output Summary (Last 24 hours) at 09/21/2018 1023 Last data filed at 09/21/2018 0700 Gross per 24 hour  Intake 480 ml  Output -  Net 480 ml     Physical Exam: Vital Signs Blood pressure (!) 167/87, pulse (!) 53, temperature (!) 97.4 F (36.3 C), temperature source Oral, resp. rate 17, height 5' 6"  (1.676 m), weight 65.9 kg, SpO2 100 %.   General: No acute distress Left sclera injected esp lateral to pupil Mood and affect are appropriate Heart: bradycardia , normal rhythm no rubs murmurs or extra sounds Lungs: Clear to auscultation, breathing unlabored, no rales or wheezes Abdomen: Positive bowel sounds, soft nontender to palpation, nondistended Extremities: No clubbing, cyanosis, or edema Skin: No evidence of breakdown, no evidence of rash Neurologic: Cranial nerves II through XII intact, motor strength is 5/5 in RIght deltoid, bicep, tricep, grip, hip flexor, knee extensors, ankle dorsiflexor and plantar flexor, Trace horz adduction  LUE, 3- LLE Quad and hip add, 2-/5 Left ankle DF/PF, increase tone  Sensory exam normal sensation to light touch and proprioception in bilateral upper and lower extremities    Musculoskeletal: Full range of motion in all 4 extremities. No joint swelling   Assessment/Plan: 1. 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  Physiatrist is providing close team supervision and 24 hour management of active medical problems listed below.  Physiatrist and rehab  team continue to assess barriers to discharge/monitor patient progress toward functional and medical goals  Care Tool:  Bathing    Body parts bathed by patient: Left arm, Chest, Abdomen, Front perineal area, Right upper leg, Left upper leg, Face, Right lower leg, Left lower leg   Body parts bathed by helper: Right arm, Buttocks     Bathing assist Assist Level: Minimal Assistance - Patient > 75%     Upper Body Dressing/Undressing Upper body dressing   What is the patient wearing?: Pull over shirt    Upper body assist Assist Level: Supervision/Verbal cueing    Lower Body Dressing/Undressing Lower body dressing      What is the patient wearing?: Underwear/pull up, Pants     Lower body assist Assist for lower body dressing: Contact Guard/Touching assist     Toileting Toileting    Toileting assist Assist for toileting: Moderate Assistance - Patient 50 - 74%     Transfers Chair/bed transfer  Transfers assist     Chair/bed transfer assist level: Minimal Assistance - Patient > 75%     Locomotion Ambulation   Ambulation assist      Assist level: Minimal Assistance - Patient > 75% Assistive device: Walker-rolling Max distance: 100 ft   Walk 10 feet activity   Assist     Assist level: Minimal Assistance - Patient > 75% Assistive device: Walker-rolling   Walk 50 feet activity   Assist Walk 50 feet with 2 turns activity did not occur: Safety/medical concerns  Assist level: Minimal Assistance - Patient > 75% Assistive device: Walker-rolling, Orthosis    Walk  150 feet activity   Assist Walk 150 feet activity did not occur: Safety/medical concerns         Walk 10 feet on uneven surface  activity   Assist Walk 10 feet on uneven surfaces activity did not occur: Safety/medical concerns         Wheelchair     Assist Will patient use wheelchair at discharge?: (TBD) Type of Wheelchair: Manual    Wheelchair assist level:  Supervision/Verbal cueing Max wheelchair distance: 150    Wheelchair 50 feet with 2 turns activity    Assist        Assist Level: Supervision/Verbal cueing   Wheelchair 150 feet activity     Assist     Assist Level: Supervision/Verbal cueing    Medical Problem List and Plan: 1.   Left side weakness with dysarthria/dysphagia secondary to lacunar infarction in the posterior limb right internal capsule             -CIR PT, OT,  SLP , Team conference today please see physician documentation under team conference tab, met with team face-to-face to discuss problems,progress, and goals. Formulized individual treatment plan based on medical history, underlying problem and comorbidities. 2.  DVT Prophylaxis/Anticoagulation: subcutaneous Lovenox. Monitor for any bleeding episodes 3. Pain Management: Neurontin 600 mg 3 times a day reduced to twice daily given creatinine clearance,tramadol as needed             -support while bed/chair to reduce sublux 4. Mood:  Provide emotional support 5. Neuropsych: This patient is capable of making decisions on his own behalf. 6. Skin/Wound Care:  Routine skin checks 7. Fluids/Electrolytes/Nutrition:  Routine in and out's with follow-up chemistries 8. Dysphagia. Dysphagia #3 nectar thick liquids. Follow-up speech therapy, advance as tolerated, improving per SLP  recheck MBS today, did not show any improvement.  Patient did have some improved awareness of aspiration 9. Diabetes mellitus with peripheral neuropathy. Hemoglobin A1c 10.3. Glucotrol 10 mg daily reduce to 103m,  Reduce levimir to 118mQhs 1/31 Check blood sugars before meals and at bedtime. Diabetic teaching CBG (last 3)  Recent Labs    09/20/18 1639 09/20/18 2135 09/21/18 0637  GLUCAP 134* 161* 113*  controlled, 09/21/2018 10. Hypertension. Lisinopril 40 mg daily. Vitals:   09/20/18 1328 09/20/18 1922  BP: (!) 159/80 (!) 167/87  Pulse: 65 (!) 53  Resp: 18 17  Temp: (!) 97.4 F  (36.3 C) (!) 97.4 F (36.3 C)  SpO2: 100% 100%  elevated 2/5/2020pt ~3wk post infarct may tighten BP control will check ortho BPs first, EKG 1/16 sinus  Brady intermittent 11. Hyperlipidemia. Lipitor 12. Tobacco as well as polysubstance abuse. NicoDerm patch. Provide counseling     LOS: 15 days A FACE TO FACulpeper/12/2018, 10:23 AM

## 2018-09-21 NOTE — Progress Notes (Signed)
Speech Language Pathology Daily Session Note  Patient Details  Name: Evan Townsend Sr. MRN: [id number] Date of Birth: 1956/01/07  Today's Date: 09/21/2018 SLP Individual Time: 1430-1455 SLP Individual Time Calculation (min): 25 min  Short Term Goals: Week 2: SLP Short Term Goal 1 (Week 2): Patient will consume current diet with minimal overt s/s of aspiration with supervision verbal cues for use of swallowing compensatory strategies.  SLP Short Term Goal 2 (Week 2): Patient will utilize speech intelligibility strategies at the sentence level with Mod A verbal cues to achieve ~90% intelligibility.  SLP Short Term Goal 3 (Week 2): Patient will self-monitor and correct errors during functional tasks with Min A verbal cues.  Skilled Therapeutic Interventions: Skilled treatment session focused on speech goals. SLP facilitated session by providing supervision verbal cues for use of speech intelligibility strategies during a moderately distracting environment to achieve ~90% intelligibility at the sentence level. Patient left sitting EOB with alarm on. Continue with current plan of care.       Pain No/Denies Pain   Therapy/Group: Individual Therapy  Dennie Moltz 09/21/2018, 3:20 PM

## 2018-09-21 NOTE — Progress Notes (Signed)
Physical Therapy Session Note  Patient Details  Name: Evan Townsend. MRN: [id number] Date of Birth: 1955-10-09  Today's Date: 09/21/2018 PT Individual Time: 1500-1600 PT Individual Time Calculation (min): 60 min   Short Term Goals: Week 2:  PT Short Term Goal 1 (Week 2): STG=LTGs due to ELOS  Skilled Therapeutic Interventions/Progress Updates:    Pt supine in bed upon PT arrival, agreeable to therapy tx and denies pain. Pt performed stand pivot to w/c with CGA and transported to gym. Pt participated in Body weight supported treadmill training (BWSTT) this session to performed forwards and backwards ambulation. Pt ambulated forwards 0.4-0. >500 ft this session x 10 minutes-  x2 trials with R UE support and x 1 trial without UE support, one therapist facilitating L LE placement and knee/hip extension during stance, second therapist facilitating lateral weightshifting, verbal cues throughout for sequencing. Pt ambulated backwards 0.2-0.4 mph >200 ft this session with R UE support, +2 for manual facilitation of gait. Pt ambulated overground x 300 ft, min assist +2 for 3 musketeers. Pt worked on weightshifting, hip strengthening and neuro re-ed to perform sidestepping while sliding yoga block x 10 ft in each direction with UE support on rail. Pt worked on L LE stance control while kicking yoga block with R LE, worked on L LE swing while kicking yoga block with L LE. Pt ambulated x 100 ft back to room with RW and min assist, verbal cues for sequencing and step length. Pt left supine with needs in reach and bed alarm set.   Therapy Documentation Precautions:  Precautions Precautions: Fall Restrictions Weight Bearing Restrictions: No    Therapy/Group: Individual Therapy  Cresenciano Genre, PT, DPT 09/21/2018, 7:57 AM

## 2018-09-21 NOTE — Progress Notes (Signed)
Social Work Patient ID: Evan E Hairston Sr., male   DOB: 05/30/1956, 62 y.o.   MRN: [id number] Met with pt and spoke with wife to discuss team conference progress toward his goals of supervision-min assist level and discharge 2/8. Pt feels ready to go home and tired fo being in a hospital. He feels between his wife and multiple family members he will have 24 hr care. Will set up home health and equipment needs.  

## 2018-09-21 NOTE — Progress Notes (Signed)
Occupational Therapy Session Note  Patient Details  Name: Evan DOMINE Sr. MRN: [id number] Date of Birth: Feb 11, 1956  Today's Date: 09/21/2018 OT Individual Time: 0700-0800 OT Individual Time Calculation (min): 60 min    Short Term Goals: Week 2:  OT Short Term Goal 1 (Week 2): Pt will perform toilet transfers with min A and min verbal cues for safety OT Short Term Goal 1 - Progress (Week 2): Met OT Short Term Goal 2 (Week 2): Pt will perform toileting tasks with mod A sit<>stand for clothing management OT Short Term Goal 2 - Progress (Week 2): Met OT Short Term Goal 3 (Week 2): Pt will don underpants and pant with min A OT Short Term Goal 3 - Progress (Week 2): Met OT Short Term Goal 4 (Week 2): Pt will don shoes and fasten with shoe buttrons with min A OT Short Term Goal 4 - Progress (Week 2): Discontinued (comment) Week 3:  OT Short Term Goal 1 (Week 3): STG=LTG secondary to ELOS  Skilled Therapeutic Interventions/Progress Updates:    OT intervention with focus on bed mobility, sit<>stand, standing balance, BADL retraining, functional transfers, safety awareness, and activity tolerance to increase independence with BADLs.  See below for BADL assist levels.  Pt requires occasional CGA for standing balance to complete LB bathing/dressing task.  Pt incorporates hemi dressing techniques to complete dressing tasks, requiring assistance only for donning sock and AFO/shoe on L foot. Pt continues to requires occasional verbal cues for LLE positioning prior to sit<>stand and functional transfers. Pt with small black spot near tip of tongue (not present previous day). PA Linna Hoff and [employer] notified and attended to pt.  Pt remained in w/c with all needs present, belt alarm activated, and NT Mark present.   Therapy Documentation Precautions:  Precautions Precautions: Fall Restrictions Weight Bearing Restrictions: No Pain:  Pt denies pain this morning ADL: ADL Eating:  Supervision/safety Where Assessed-Eating: Wheelchair Grooming: Contact guard Where Assessed-Grooming: Sitting at sink Upper Body Bathing: Minimal assistance Where Assessed-Upper Body Bathing: Shower Lower Body Bathing: Minimal assistance Where Assessed-Lower Body Bathing: Shower Upper Body Dressing: Supervision/safety Where Assessed-Upper Body Dressing: Sitting at sink, Wheelchair Lower Body Dressing: Minimal assistance Where Assessed-Lower Body Dressing: Sitting at sink, Wheelchair, Standing at sink [employer] Transfer: Environmental education officer Method: Stand pivot Youth worker: Grab bars, Transfer tub bench  Therapy/Group: Individual Therapy  Leroy Libman 09/21/2018, 8:35 AM

## 2018-09-21 NOTE — Progress Notes (Signed)
Occupational Therapy Session Note  Patient Details  Name: Evan DEADY Sr. MRN: [id number] Date of Birth: [birth year]  Today's Date: 09/21/2018 OT Individual Time: 1135-1200 OT Individual Time Calculation (min): 25 min    Short Term Goals: Week 2:  OT Short Term Goal 1 (Week 2): Pt will perform toilet transfers with min A and min verbal cues for safety OT Short Term Goal 1 - Progress (Week 2): Met OT Short Term Goal 2 (Week 2): Pt will perform toileting tasks with mod A sit<>stand for clothing management OT Short Term Goal 2 - Progress (Week 2): Met OT Short Term Goal 3 (Week 2): Pt will don underpants and pant with min A OT Short Term Goal 3 - Progress (Week 2): Met OT Short Term Goal 4 (Week 2): Pt will don shoes and fasten with shoe buttrons with min A OT Short Term Goal 4 - Progress (Week 2): Discontinued (comment) Week 3:  OT Short Term Goal 1 (Week 3): STG=LTG secondary to ELOS  Skilled Therapeutic Interventions/Progress Updates:    OT intervention with focus on LUE NMR (see below). Pt with improved shoulder and elbow flexion/extension and horizontal adduction.  Pt with trace finger flexion.  Pt educated on LUE SROM and pt return demonstrated.  Pt remained seated in w/c with all needs within reach and belt alarm activated.   Therapy Documentation Precautions:  Precautions Precautions: Fall Restrictions Weight Bearing Restrictions: No Pain:  Pt denies pain Other Treatments: Treatments Neuromuscular Facilitation: Left;Activity to increase coordination;Activity to increase motor control;Activity to increase timing and sequencing;Activity to increase grading;Activity to increase sustained activation;Activity to increase anterior-posterior weight shifting;Activity to increase lateral weight shifting Weight Bearing Technique Weight Bearing Technique: Yes LUE Weight Bearing Technique: Forearm seated   Therapy/Group: Individual Therapy  Leroy Libman 09/21/2018, 2:04  PM

## 2018-09-22 ENCOUNTER — Inpatient Hospital Stay (HOSPITAL_COMMUNITY): Payer: Medicaid Other | Admitting: Speech Pathology

## 2018-09-22 ENCOUNTER — Inpatient Hospital Stay (HOSPITAL_COMMUNITY): Payer: Medicaid Other | Admitting: Occupational Therapy

## 2018-09-22 ENCOUNTER — Inpatient Hospital Stay (HOSPITAL_COMMUNITY): Payer: Medicaid Other | Admitting: Physical Therapy

## 2018-09-22 DIAGNOSIS — I639 Cerebral infarction, unspecified: Secondary | ICD-10-CM | POA: Diagnosis not present

## 2018-09-22 LAB — GLUCOSE, CAPILLARY
Glucose-Capillary: 110 mg/dL — ABNORMAL HIGH (ref 70–99)
Glucose-Capillary: 84 mg/dL (ref 70–99)
Glucose-Capillary: 129 mg/dL — ABNORMAL HIGH (ref 70–99)
Glucose-Capillary: 168 mg/dL — ABNORMAL HIGH (ref 70–99)

## 2018-09-22 NOTE — Discharge Summary (Signed)
NAME: Evan BangsLSTON SR., Evan E. MEDICAL RECORD YN:[id number] ACCOUNT [id number] DATE OF BIRTH:1956/05/24 FACILITY: [facility] LOCATION: [facility] PHYSICIAN:Evan Wynn BankerKIRSTEINS, MD  DISCHARGE SUMMARY  DATE OF DISCHARGE:  09/23/2018  ADMIT DATE:  09/06/2018  DISCHARGE DATE:  09/23/2018  DISCHARGE DIAGNOSES: 1.  Lacunar infarction in the posterior limb of right internal capsule.   2.  Subcutaneous Lovenox for deep venous thrombosis prophylaxis. 3.  Pain management. 4.  Dysphagia. 5.  Diabetes mellitus with peripheral neuropathy. 6.  Hypertension. 7.  Hyperlipidemia. 8.  Tobacco as well as polysubstance abuse.  HOSPITAL COURSE:  This is a 63 year old right-handed male with history of CVA in the past without residual, hypertension, diabetes mellitus, tobacco abuse who presented 09/01/2018 [facility] with left facial droop, left-sided weakness,  slurred speech.  He lives with spouse.  Independent prior to admission.  Cranial CT scan negative.  Urine drug screen positive for cocaine.  The patient did not receive tPA.  Carotid Dopplers with no ICA stenosis.  MRI/MRA showed positive acute lacunar  infarction of the posterior limb of right internal capsule.  No associated hemorrhage.  MRA without stenosis.  Echocardiogram with ejection fraction 65%, grade I diastolic dysfunction, no regional wall motion abnormalities.  Maintained on aspirin and  Plavix therapy for CVA prophylaxis.  Subcutaneous Lovenox for DVT prophylaxis.  He was on a mechanical soft nectar thick liquid diet.  The patient was admitted for comprehensive rehabilitation program.  PAST MEDICAL HISTORY:  See discharge diagnoses.  SOCIAL HISTORY:  Lives with spouse, reportedly independent prior to admission.  FUNCTIONAL STATUS:  Upon admission to rehab services was moderate assist 4 feet with a hemi walker, moderate assist supine to sit, min mod assist with activities of daily living.  PHYSICAL EXAMINATION: VITAL SIGNS:   Blood pressure 125/63, pulse 49, temperature 98, respirations 18. GENERAL:  Alert male.  Left facial droop.  Speech dysarthric, but intelligible. HEENT:  EOMs intact. NECK:  Supple, nontender, no JVD. CARDIOVASCULAR:  Rate controlled. ABDOMEN:  Soft, nontender, good bowel sounds. LUNGS:  Clear to auscultation without wheeze.  REHABILITATION HOSPITAL COURSE:  The patient was admitted to inpatient rehabilitation services.  Therapies initiated on a 3-hour daily basis, consisting of physical therapy, occupational therapy, speech therapy and rehabilitation nursing.  The following  issues were addressed:  Pertaining to the patient's lacunar infarction, he remained on aspirin and Plavix therapy.  Ambulatory referral obtained with neurology services.  Subcutaneous Lovenox for DVT prophylaxis.  His diet was advanced to a mechanical  soft remaining on a nectar-thick liquid diet.  No signs of aspiration.  Follow up speech therapy.  Blood sugars overall controlled.  Hemoglobin A1c of 10.3.  He remained on Glucotrol as well as insulin therapy.  Diabetic teaching noted.  Referral made  for PCP per case management.  Blood pressure is controlled on lisinopril.  He continued on Lipitor for hyperlipidemia.  Noted history of tobacco and polysubstance abuse.  Urine drug screen positive for cocaine.  The patient received counseling in regards  to cessation of illicit drug products, alcohol and tobacco.  The patient received weekly collaborative interdisciplinary team conferences to discuss estimated length of stay, family teaching, any barriers to his discharge.  The patient ambulates 500  feet, working with energy conservation techniques, assistive device as noted.  Monitoring of safety.  Activities of daily living and homemaking.  Working with sit-to-stand mobility, functional transfers using a rolling walker.  He can set at the edge of  bed.  Performed sit to stand supervision  level.  Performed tub transfer bench  transfers, contact guard assist.  He was able to fully communicate his needs.  Full family teaching was completed and planned discharge to home.  DISCHARGE MEDICATIONS:  Included aspirin 81 mg p.o. daily, Lipitor 80 mg p.o. daily, Plavix 75 mg p.o. daily, Neurontin 600 mg p.o. b.i.d., Glucotrol 5 mg p.o. daily, Levemir insulin 10 units daily, lisinopril 40 mg p.o. daily, Nicoderm patch as  directed.  TobraDex ophthalmic solution 1 drop left eye every 4 hours while awake, Tylenol as needed.  DIET:  Mechanical soft nectar thick liquids.    FOLLOWUP:  He would follow up with Dr. Claudette Laws at the outpatient rehab center as directed; ambulatory referral obtained with neurology services; case management arranging for PCP.  SPECIAL INSTRUCTIONS:  No driving, smoking, alcohol or illicit drug products.  TN/NUANCE D:09/22/2018 T:09/22/2018 JOB:[id number]

## 2018-09-22 NOTE — Discharge Summary (Signed)
Discharge summary job # [id number]

## 2018-09-22 NOTE — Progress Notes (Signed)
Physical Therapy Session Note  Patient Details  Name: Evan SIRAK Sr. MRN: [id number] Date of Birth: 1956-06-26  Today's Date: 09/22/2018 PT Individual Time: 1100-1157 PT Individual Time Calculation (min): 57 min   Short Term Goals: Week 2:  PT Short Term Goal 1 (Week 2): STG=LTGs due to ELOS  Skilled Therapeutic Interventions/Progress Updates:   Pt in w/c and agreeable to therapy, denies pain. Pt self-propelled w/c to/from therapy gym. Worked on gait training in home environment this session. Pt aware that treatment team is recommending household gait for short distances only and w/ LAFO, LUE orthosis, and gait belt. Ambulated over carpet, carpet threshold, and while weaving around cones. Primarily CGA, needed min assist to correct multiple LOBs. Verbal cues for RW management and occasional L step placement. Blocked practice of stand pivot transfers in both directions to chairs w/ and w/o armrests. CGA overall and educated pt on importance of 2nd helper there to secure chair he is transferring to. Educated pt on his risk for having another stroke and discussed signs of a stroke. Pt appreciative of education and has already quit smoking recently. Returned to room and ended session in w/c, all needs in reach.   Therapy Documentation Precautions:  Precautions Precautions: Fall Restrictions Weight Bearing Restrictions: No  Therapy/Group: Individual Therapy  Chane Cowden K Janete Quilling 09/22/2018, 12:00 PM

## 2018-09-22 NOTE — Progress Notes (Signed)
Physical Therapy Discharge Summary  Patient Details  Name: Evan RHINESMITH Sr. MRN: [id number] Date of Birth: 1956/05/11  Today's Date: 09/22/2018 PT Individual Time: 0800-0850 PT Individual Time Calculation (min): 50 min   Pt in w/c and agreeable to therapy, no c/o pain. Pt w/ increased fatigue this morning, states he did not sleep well last night as he was "excited about leaving" today. Wife present for family education. Pt performed functional mobility as detailed below including w/c mobility, gait, functional transfers, and bed mobility. Practiced furniture transfers to/from couch and ADL apartment bed, as well as car transfer. All transfers require close supervision, however pt's wife planning on providing CGA until she feels more comfortable. Educated wife on assisting w/ transfers and providing CGA w/ gait. Wife returned demonstration safely. She is a private caregiver and has experience providing this level of assistance. Reinforced use of gait belt for all mobility for safety, especially w/ gait. Pt and wife both agreeable and both verbalize understanding that pt is only safe for short distance household gait w/ LUE RW orthosis and LAFO donned. Returned to room via w/c. Provided pt and wife w/ written resources including stroke support group information and Chiropractor community resources. Both appreciative. Pt falling asleep in w/c towards end of session. Ended session in w/c, all needs in reach. Missed 10 min of skilled PT 2/2 fatigue.   Patient has met 8 of 10 long term goals due to improved activity tolerance, improved balance, improved postural control, increased strength, ability to compensate for deficits, functional use of  left upper extremity and left lower extremity, improved attention, improved awareness and improved coordination.  Patient to discharge at a wheelchair level Supervision and CGA for short distance gait. Patient's care partner is independent to provide the necessary  physical assistance at discharge. See above for caregiver education information.   Reasons goals not met: Pt continues to require min assist w/ dynamic standing balance tasks for safety and min assist for bed mobility 2/2 LLE weakness preventing pt from performing w/o assist. Original stair negotiation no longer applicable as pt and wife recently moved into house w/ ramped entrance.   Recommendation:  Patient will benefit from ongoing skilled PT services in home health setting to continue to advance safe functional mobility, address ongoing impairments in functional balance, L attention and L body awareness, LLE NMR and LLE strength, and minimize fall risk.  Equipment: w/c, RW, LUE orthosis for RW, and LAFO  Reasons for discharge: treatment goals met and discharge from hospital  Patient/family agrees with progress made and goals achieved: Yes  PT Discharge Precautions/Restrictions Precautions Precautions: Fall Restrictions Weight Bearing Restrictions: No Vision/Perception  Perception Perception: Within Functional Limits Praxis Praxis: Intact  Cognition Overall Cognitive Status: Impaired/Different from baseline Arousal/Alertness: Awake/alert Orientation Level: Oriented X4 Sustained Attention: Appears intact Memory: Appears intact Safety/Judgment: Appears intact Comments: impulsivity improved since eval Sensation Sensation Light Touch: Appears Intact(BLEs) Coordination Gross Motor Movements are Fluid and Coordinated: No Fine Motor Movements are Fluid and Coordinated: No Heel Shin Test: LLE impaired, RLE intact Motor  Motor Motor: Hemiplegia Motor - Discharge Observations: L hemi remains, UE>LE  Mobility Bed Mobility Bed Mobility: Rolling Right;Rolling Left;Supine to Sit;Sit to Supine Rolling Right: Supervision/verbal cueing Rolling Left: Supervision/Verbal cueing Supine to Sit: Supervision/Verbal cueing Sit to Supine: Supervision/Verbal cueing Transfers Transfers:  Sit to Stand;Stand to Sit;Stand Pivot Transfers Sit to Stand: Supervision/Verbal cueing Stand to Sit: Supervision/Verbal cueing Stand Pivot Transfers: Supervision/Verbal cueing Transfer (Assistive device): None Locomotion  Gait Ambulation:  Yes Gait Assistance: Contact Guard/Touching assist Gait Distance (Feet): 75 Feet Assistive device: Rolling walker(LUE orthosis) Gait Gait: Yes Gait Pattern: Impaired Gait Pattern: Step-to pattern;Narrow base of support;Decreased dorsiflexion - left;Decreased stance time - left;Decreased hip/knee flexion - left;Poor foot clearance - left Gait velocity: decreased Stairs / Additional Locomotion Stairs: No Architect: Yes Wheelchair Assistance: Chartered loss adjuster: Right lower extremity;Right upper extremity Wheelchair Parts Management: Supervision/cueing Distance: 150'  Trunk/Postural Assessment  Cervical Assessment Cervical Assessment: Within Functional Limits Thoracic Assessment Thoracic Assessment: Within Functional Limits Lumbar Assessment Lumbar Assessment: Within Functional Limits Postural Control Postural Control: Deficits on evaluation(delayed/insufficient in standing )  Balance Balance Balance Assessed: Yes Static Sitting Balance Static Sitting - Balance Support: No upper extremity supported;Feet supported Static Sitting - Level of Assistance: 5: Stand by assistance Dynamic Sitting Balance Dynamic Sitting - Balance Support: No upper extremity supported;Feet supported Dynamic Sitting - Level of Assistance: 5: Stand by assistance Static Standing Balance Static Standing - Balance Support: Right upper extremity supported;During functional activity Static Standing - Level of Assistance: 4: Min assist(CGA) Dynamic Standing Balance Dynamic Standing - Balance Support: Right upper extremity supported;During functional activity Dynamic Standing - Level of Assistance: 4: Min  assist Extremity Assessment  RLE Assessment RLE Assessment: Within Functional Limits LLE Assessment LLE Assessment: Exceptions to [facility] Passive Range of Motion (PROM) Comments: WFL LLE Strength LLE Overall Strength: Deficits Left Hip Flexion: 3-/5 Left Hip Extension: 3-/5 Left Hip ABduction: 3/5 Left Hip ADduction: 3/5 Left Knee Flexion: 2/5 Left Knee Extension: 3+/5 Left Ankle Dorsiflexion: 0/5 Left Ankle Plantar Flexion: 0/5 Left Ankle Inversion: 0/5 Left Ankle Eversion: 0/5    Chelcie Estorga K Nakaya Mishkin 09/22/2018, 12:42 PM

## 2018-09-22 NOTE — Progress Notes (Signed)
Social Work Patient ID: Evan Ano Sr., male   DOB: [birth year], 63 y.o.   MRN: [id number] Pt requesting to discharge tomorrow after family education MD has approved will work on discharge tomorrow after education with wife.

## 2018-09-22 NOTE — Discharge Instructions (Signed)
Inpatient Rehab Discharge Instructions  Evan Townsend E Mcneary Sr. Discharge date and time: No discharge date for patient encounter.   Activities/Precautions/ Functional Status: Activity: activity as tolerated Diet:  Wound Care: none needed Functional status:  ___ No restrictions     ___ Walk up steps independently ___ 24/7 supervision/assistance   ___ Walk up steps with assistance ___ Intermittent supervision/assistance  ___ Bathe/dress independently ___ Walk with walker     _x__ Bathe/dress with assistance ___ Walk Independently    ___ Shower independently ___ Walk with assistance    ___ Shower with assistance ___ No alcohol     ___ Return to work/school ________  Special Instructions: No driving, smoking, alcohol or illicit drug use    COMMUNITY REFERRALS UPON DISCHARGE:    Home Health:   PT, OT, SP   Agency:[facility]   Phone:[phone]   Date of last service:09/24/2018  Medical Equipment/Items Ordered:WHEELCHAIR, ROLLING WALKER, 3 IN 1 & TUB BENCH  Agency/Supplier:[facility]   [phone]  Other:WIFE TO GET MD NAME CHANGED ON PT'S MEDICAID CARD SO SHE CAN MAKE AN APPOINTMENT WITH [facility]  GENERAL COMMUNITY RESOURCES FOR PATIENT/FAMILY: Support Groups:[facility] SUPPORT GROUP  THE SECOND Thursday @ 6:00-7:00 PM ON THE REHAB UNIT QUESTIONS CALL AMY [phone]  STROKE/TIA DISCHARGE INSTRUCTIONS SMOKING Cigarette smoking nearly doubles your risk of having a stroke & is the single most alterable risk factor  If you smoke or have smoked in the last 12 months, you are advised to quit smoking for your health.  Most of the excess cardiovascular risk related to smoking disappears within a year of stopping.  Ask you doctor about anti-smoking medications  [place] Quit Line: 1-800-QUIT NOW  Free Smoking Cessation Classes [phone]  CHOLESTEROL Know your levels; limit fat & cholesterol in your diet  Lipid Panel     Component Value Date/Time   CHOL 115  09/02/2018 0420   CHOL 161 02/10/2018 1906   TRIG 64 09/02/2018 0420   HDL 50 09/02/2018 0420   HDL 64 02/10/2018 1906   CHOLHDL 2.3 09/02/2018 0420   VLDL 13 09/02/2018 0420   LDLCALC 52 09/02/2018 0420   LDLCALC 54 02/10/2018 1906      Many patients benefit from treatment even if their cholesterol is at goal.  Goal: Total Cholesterol (CHOL) less than 160  Goal:  Triglycerides (TRIG) less than 150  Goal:  HDL greater than 40  Goal:  LDL (LDLCALC) less than 100   BLOOD PRESSURE American Stroke Association blood pressure target is less that 120/80 mm/Hg  Your discharge blood pressure is:     Monitor your blood pressure  Limit your salt and alcohol intake  Many individuals will require more than one medication for high blood pressure  DIABETES (A1c is a blood sugar average for last 3 months) Goal HGBA1c is under 7% (HBGA1c is blood sugar average for last 3 months)  Diabetes:     Lab Results  Component Value Date   HGBA1C 10.3 (H) 02/10/2018     Your HGBA1c can be lowered with medications, healthy diet, and exercise.  Check your blood sugar as directed by your physician  Call your physician if you experience unexplained or low blood sugars.  PHYSICAL ACTIVITY/REHABILITATION Goal is 30 minutes at least 4 days per week  Activity: Increase activity slowly, Therapies: Physical Therapy: Home Health Return to work:   Activity decreases your risk of heart attack and stroke and makes your heart stronger.  It helps control your  weight and blood pressure; helps you relax and can improve your mood.  Participate in a regular exercise program.  Talk with your doctor about the best form of exercise for you (dancing, walking, swimming, cycling).  DIET/WEIGHT Goal is to maintain a healthy weight  Your discharge diet is:  Diet Order            DIET DYS 3 Room service appropriate? Yes with Assist; Fluid consistency: Nectar Thick  Diet effective now              liquids Your  height is:    Your current weight is:   Your Body Mass Index (BMI) is:     Following the type of diet specifically designed for you will help prevent another stroke.  Your goal weight range is:    Your goal Body Mass Index (BMI) is 19-24.  Healthy food habits can help reduce 3 risk factors for stroke:  High cholesterol, hypertension, and excess weight.  RESOURCES Stroke/Support Group:  Call [phone]   STROKE EDUCATION PROVIDED/REVIEWED AND GIVEN TO PATIENT Stroke warning signs and symptoms How to activate emergency medical system (call 911). Medications prescribed at discharge. Need for follow-up after discharge. Personal risk factors for stroke. Pneumonia vaccine given:  Flu vaccine given:  My questions have been answered, the writing is legible, and I understand these instructions.  I will adhere to these goals & educational materials that have been provided to me after my discharge from the hospital.      My questions have been answered and I understand these instructions. I will adhere to these goals and the provided educational materials after my discharge from the hospital.  Patient/Caregiver Signature _______________________________ Date __________  Clinician Signature _______________________________________ Date __________  Please bring this form and your medication list with you to all your follow-up doctor's appointments.

## 2018-09-22 NOTE — Progress Notes (Signed)
Occupational Therapy Session Note  Patient Details  Name: Evan RIPBERGER Sr. MRN: [id number] Date of Birth: 1956/08/17  Today's Date: 09/22/2018 OT Individual Time: 4098-1191 OT Individual Time Calculation (min): 60 min  and Today's Date: 09/22/2018 OT Group Time:  -       Short Term Goals: Week 1:  OT Short Term Goal 1 (Week 1): Pt will perform UB dressing with min A in order to decrease level of assist with self care. OT Short Term Goal 1 - Progress (Week 1): Met OT Short Term Goal 2 (Week 1): Pt will perform shower transfer with mod A to decrease level of assist with self care.  OT Short Term Goal 2 - Progress (Week 1): Met OT Short Term Goal 3 (Week 1): Pt will demonstrate LB dressing with mod I utilizing hemiplegic dressing techniques.  OT Short Term Goal 3 - Progress (Week 1): Discontinued (comment) Week 2:  OT Short Term Goal 1 (Week 2): Pt will perform toilet transfers with min A and min verbal cues for safety OT Short Term Goal 1 - Progress (Week 2): Met OT Short Term Goal 2 (Week 2): Pt will perform toileting tasks with mod A sit<>stand for clothing management OT Short Term Goal 2 - Progress (Week 2): Met OT Short Term Goal 3 (Week 2): Pt will don underpants and pant with min A OT Short Term Goal 3 - Progress (Week 2): Met OT Short Term Goal 4 (Week 2): Pt will don shoes and fasten with shoe buttrons with min A OT Short Term Goal 4 - Progress (Week 2): Discontinued (comment)  Skilled Therapeutic Interventions/Progress Updates:    1:1 Focus on self care retraining. Pt performs functional ambulation in and out of the bathroom with the AFO donned with RW with min A. Doffed clothing on toilet and min A into shower tall with tub bench. Min A for bathing with A for incorporating the use his left hand as a gross A with min A. Sit to stands with supervision. Occasional use of grab bar during standing but able to stand with contact guard without UE support while washing bottom. Pt dressed  overall with min A for shirt (long sleeve) and A for TEDS, socks over TEDS and AFO with shoe. Pt performed grooming at the sink with setup (used suction tooth brush). Pass off to next therapist.   Therapy Documentation Precautions:  Precautions Precautions: Fall Restrictions Weight Bearing Restrictions: No Pain:  no c/o pain   Therapy/Group: Individual Therapy  [name] [facility] 09/22/2018, 10:33 AM

## 2018-09-22 NOTE — Progress Notes (Signed)
Speech Language Pathology Daily Session Note  Patient Details  Name: SHED LIPP Sr. MRN: [id number] Date of Birth: 01-May-1956  Today's Date: 09/22/2018 SLP Individual Time: 0930-1025 SLP Individual Time Calculation (min): 55 min  Short Term Goals: Week 2: SLP Short Term Goal 1 (Week 2): Patient will consume current diet with minimal overt s/s of aspiration with supervision verbal cues for use of swallowing compensatory strategies.  SLP Short Term Goal 2 (Week 2): Patient will utilize speech intelligibility strategies at the sentence level with Mod A verbal cues to achieve ~90% intelligibility.  SLP Short Term Goal 3 (Week 2): Patient will self-monitor and correct errors during functional tasks with Min A verbal cues.  Skilled Therapeutic Interventions: Skilled treatment session focused on patient education. Patient's wife not present but patient reports she will be here tomorrow. SLP facilitated session by providing education in regards to patient's current swallowing function, diet recommendations, appropriate textures, medication administration, compensatory strategies and procedures for thickening liquids and the water protocol. Patient verbalized understanding and thickened 8 oz of water with set-up with supervision level verbal cues. Patient left upright in wheelchair with all needs within reach. Continue with current plan of care.       Pain No/Denies Pain   Therapy/Group: Individual Therapy  Gift Rueckert 09/22/2018, 1:11 PM

## 2018-09-22 NOTE — Progress Notes (Signed)
[place] PHYSICAL MEDICINE & REHABILITATION PROGRESS NOTE   Subjective/Complaints:  No issues overnite asking to go home 2/7  ROS- neg CP, SOB, N/V/D  Objective:   Dg Swallowing Func-speech Pathology  Result Date: 09/21/2018 Objective Swallowing Evaluation: Type of Study: MBS-Modified Barium Swallow Study  Patient Details Name: Evan Townsend Sr. MRN: [id number] Date of Birth: 16-Oct-1955 Today's Date: 09/21/2018 Time: SLP Start Time (ACUTE ONLY): 0910 -SLP Stop Time (ACUTE ONLY): 0940 SLP Time Calculation (min) (ACUTE ONLY): 30 min Past Medical History: Past Medical History: Diagnosis Date . Diabetes 1.5, managed as type 2 (HCC)  . Diabetes mellitus without complication (HCC)  . Hypercholesterolemia  . Hypertension  . Pain in both feet  . Stroke [facility])  Past Surgical History: No past surgical history on file. HPI: See H&P  Subjective: pt awake, sitting in MBSS chair. Noted Left oral weakness and Left UE weakness; decreased tone in Left corner of mouth. Pt was verbally responsive to general questions; Dysarthria noted. Assessment / Plan / Recommendation CHL IP CLINICAL IMPRESSIONS 09/21/2018 Clinical Impression Patient continues to demonstrate a moderate sensorimotor based oropharyngeal dysphagia. Oral phase is characterized by left anterior spillage due to oral motor weakness and decreased bolus cohesion resulting in premature spillage of liquids. Patient consistently initiates his swallow at the pyriform sinuses resulting in both intermittent sensed and silent aspiration of thin liquids. Patient's sensation appears to be improving with increased ability to "feel" aspiration. Compensatory strategies such as postural changes and bolus size/administration were ineffective in reducing aspiration episodes consistently.   Patient's pharyngeal strength appeared [facility] for all consistencies tested. Recommend patient continue current diet of Dys. 3 textures with nectar-thick liquids and continue the water protocol.   Patient educated in regards to results of MBS and verbalized understanding but will need reinforcement. SLP Visit Diagnosis Dysphagia, oropharyngeal phase (R13.12) Attention and concentration deficit following -- Frontal lobe and executive function deficit following -- Impact on safety and function Moderate aspiration risk   CHL IP TREATMENT RECOMMENDATION 09/21/2018 Treatment Recommendations Therapy as outlined in treatment plan below   Prognosis 09/14/2018 Prognosis for Safe Diet Advancement Good Barriers to Reach Goals -- Barriers/Prognosis Comment -- CHL IP DIET RECOMMENDATION 09/21/2018 SLP Diet Recommendations Dysphagia 3 (Mech soft) solids;Nectar thick liquid;Free water protocol after oral care Liquid Administration via Cup Medication Administration Whole meds with puree Compensations Minimize environmental distractions;Slow rate;Small sips/bites;Lingual sweep for clearance of pocketing;Follow solids with liquid Postural Changes --   CHL IP OTHER RECOMMENDATIONS 09/21/2018 Recommended Consults -- Oral Care Recommendations Oral care BID Other Recommendations Order thickener from pharmacy;Prohibited food (jello, ice cream, thin soups);Remove water pitcher;Have oral suction available   CHL IP FOLLOW UP RECOMMENDATIONS 09/21/2018 Follow up Recommendations Inpatient Rehab   CHL IP FREQUENCY AND DURATION 09/21/2018 Speech Therapy Frequency (ACUTE ONLY) min 3x week Treatment Duration 1 week      CHL IP ORAL PHASE 09/21/2018 Oral Phase Impaired Oral - Pudding Teaspoon -- Oral - Pudding Cup -- Oral - Honey Teaspoon -- Oral - Honey Cup -- Oral - Nectar Teaspoon -- Oral - Nectar Cup NT Oral - Nectar Straw -- Oral - Thin Teaspoon -- Oral - Thin Cup Premature spillage;Left anterior bolus loss;Decreased bolus cohesion Oral - Thin Straw Left anterior bolus loss;Decreased bolus cohesion;Premature spillage Oral - Puree WFL Oral - Mech Soft Impaired mastication Oral - Regular -- Oral - Multi-Consistency -- Oral - Pill -- Oral Phase -  Comment --  CHL IP PHARYNGEAL PHASE 09/21/2018 Pharyngeal Phase Impaired Pharyngeal- Pudding Teaspoon --  Pharyngeal -- Pharyngeal- Pudding Cup -- Pharyngeal -- Pharyngeal- Honey Teaspoon -- Pharyngeal -- Pharyngeal- Honey Cup -- Pharyngeal -- Pharyngeal- Nectar Teaspoon -- Pharyngeal -- Pharyngeal- Nectar Cup NT Pharyngeal -- Pharyngeal- Nectar Straw -- Pharyngeal -- Pharyngeal- Thin Teaspoon -- Pharyngeal -- Pharyngeal- Thin Cup Delayed swallow initiation-vallecula;Delayed swallow initiation-pyriform sinuses;Compensatory strategies attempted (with notebox);Penetration/Aspiration during swallow Pharyngeal Material does not enter airway;Material enters airway, passes BELOW cords without attempt by patient to eject out (silent aspiration);Material enters airway, passes BELOW cords and not ejected out despite cough attempt by patient Pharyngeal- Thin Straw Delayed swallow initiation-pyriform sinuses;Delayed swallow initiation-vallecula;Compensatory strategies attempted (with notebox);Penetration/Aspiration during swallow Pharyngeal Material enters airway, passes BELOW cords without attempt by patient to eject out (silent aspiration);Material enters airway, passes BELOW cords and not ejected out despite cough attempt by patient Pharyngeal- Puree Delayed swallow initiation-vallecula Pharyngeal -- Pharyngeal- Mechanical Soft Delayed swallow initiation-vallecula Pharyngeal -- Pharyngeal- Regular -- Pharyngeal -- Pharyngeal- Multi-consistency -- Pharyngeal -- Pharyngeal- Pill -- Pharyngeal -- Pharyngeal Comment --  CHL IP CERVICAL ESOPHAGEAL PHASE 09/05/2018 Cervical Esophageal Phase WFL Pudding Teaspoon -- Pudding Cup -- Honey Teaspoon -- Honey Cup -- Nectar Teaspoon -- Nectar Cup -- Nectar Straw -- Thin Teaspoon -- Thin Cup -- Thin Straw -- Puree -- Mechanical Soft -- Regular -- Multi-consistency -- Pill -- Cervical Esophageal Comment -- PAYNE, COURTNEY 09/21/2018, 3:18 PM              No results for input(s): WBC, HGB, HCT,  PLT in the last 72 hours. Recent Labs    09/20/18 0629  CREATININE 1.39*    Intake/Output Summary (Last 24 hours) at 09/22/2018 0814 Last data filed at 09/21/2018 1830 Gross per 24 hour  Intake 480 ml  Output -  Net 480 ml     Physical Exam: Vital Signs Blood pressure 133/71, pulse (!) 45, temperature (!) 97.5 F (36.4 C), temperature source Oral, resp. rate 14, height 5\' 6"  (1.676 m), weight 65.9 kg, SpO2 100 %.   General: No acute distress Left sclera injected esp lateral to pupil Mood and affect are appropriate Heart: bradycardia , normal rhythm no rubs murmurs or extra sounds Lungs: Clear to auscultation, breathing unlabored, no rales or wheezes Abdomen: Positive bowel sounds, soft nontender to palpation, nondistended Extremities: No clubbing, cyanosis, or edema Skin: No evidence of breakdown, no evidence of rash Neurologic: Cranial nerves II through XII intact, motor strength is 5/5 in RIght deltoid, bicep, tricep, grip, hip flexor, knee extensors, ankle dorsiflexor and plantar flexor, Trace horz adduction  LUE, 3- LLE Quad and hip add, 2-/5 Left ankle DF/PF, increase tone  Sensory exam normal sensation to light touch and proprioception in bilateral upper and lower extremities    Musculoskeletal: Full range of motion in all 4 extremities. No joint swelling   Assessment/Plan: 1. 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  Physiatrist is providing close team supervision and 24 hour management of active medical problems listed below.  Physiatrist and rehab team continue to assess barriers to discharge/monitor patient progress toward functional and medical goals  Care Tool:  Bathing    Body parts bathed by patient: Left arm, Chest, Abdomen, Front perineal area, Right upper leg, Left upper leg, Face, Right lower leg, Left lower leg   Body parts  bathed by helper: Right arm, Buttocks     Bathing assist Assist Level: Minimal Assistance - Patient > 75%  Upper Body Dressing/Undressing Upper body dressing   What is the patient wearing?: Pull over shirt    Upper body assist Assist Level: Supervision/Verbal cueing    Lower Body Dressing/Undressing Lower body dressing      What is the patient wearing?: Underwear/pull up, Pants     Lower body assist Assist for lower body dressing: Contact Guard/Touching assist     Toileting Toileting    Toileting assist Assist for toileting: Moderate Assistance - Patient 50 - 74%     Transfers Chair/bed transfer  Transfers assist     Chair/bed transfer assist level: Minimal Assistance - Patient > 75%     Locomotion Ambulation   Ambulation assist      Assist level: Minimal Assistance - Patient > 75% Assistive device: Walker-rolling Max distance: 100 ft   Walk 10 feet activity   Assist     Assist level: Minimal Assistance - Patient > 75% Assistive device: Walker-rolling   Walk 50 feet activity   Assist Walk 50 feet with 2 turns activity did not occur: Safety/medical concerns  Assist level: Minimal Assistance - Patient > 75% Assistive device: Walker-rolling, Orthosis    Walk 150 feet activity   Assist Walk 150 feet activity did not occur: Safety/medical concerns         Walk 10 feet on uneven surface  activity   Assist Walk 10 feet on uneven surfaces activity did not occur: Safety/medical concerns         Wheelchair     Assist Will patient use wheelchair at discharge?: (TBD) Type of Wheelchair: Manual    Wheelchair assist level: Supervision/Verbal cueing Max wheelchair distance: 150    Wheelchair 50 feet with 2 turns activity    Assist        Assist Level: Supervision/Verbal cueing   Wheelchair 150 feet activity     Assist     Assist Level: Supervision/Verbal cueing    Medical Problem List and Plan: 1.   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  DVT Prophylaxis/Anticoagulation: subcutaneous Lovenox. Monitor for any bleeding episodes 3. Pain Management: Neurontin 600 mg 3 times a day reduced to twice daily given creatinine clearance,tramadol as needed             -support while bed/chair to reduce sublux 4. Mood:  Provide emotional support 5. Neuropsych: This patient is capable of making decisions on his own behalf. 6. Skin/Wound Care:  Routine skin checks 7. Fluids/Electrolytes/Nutrition:  Routine in and out's with follow-up chemistries 8. Dysphagia. Dysphagia #3 nectar thick liquids. Follow-up speech therapy, advance as tolerated, improving per SLP  recheck MBS today, did not show any improvement.  Patient did have some improved awareness of aspiration 9. Diabetes mellitus with peripheral neuropathy. Hemoglobin A1c 10.3. Glucotrol 10 mg daily reduce to 5mg ,  Reduce levimir to 10mg  Qhs 1/31 Check blood sugars before meals and at bedtime. Diabetic teaching CBG (last 3)  Recent Labs    09/21/18 1705 09/21/18 2110 09/22/18 0710  GLUCAP 129* 178* 110*  controlled, 09/22/2018 10. Hypertension. Lisinopril 40 mg daily. Vitals:   09/21/18 2044 09/22/18 0609  BP: (!) 149/80 133/71  Pulse: (!) 59 (!) 45  Resp: 14 14  Temp: 98 F (36.7 C) (!) 97.5 F (36.4 C)  SpO2: 100% 100%  controlled  09/22/2018, EKG 1/16 sinus  Brady intermittent  11. Hyperlipidemia. Lipitor 12. Tobacco as well as polysubstance abuse. NicoDerm patch. Provide counseling     LOS: 16 days A FACE TO FACE EVALUATION WAS PERFORMED  Erick Colace 09/22/2018, 8:14 AM

## 2018-09-23 ENCOUNTER — Inpatient Hospital Stay (HOSPITAL_COMMUNITY): Payer: Medicaid Other | Admitting: Physical Therapy

## 2018-09-23 ENCOUNTER — Inpatient Hospital Stay (HOSPITAL_COMMUNITY): Payer: Medicaid Other | Admitting: Speech Pathology

## 2018-09-23 ENCOUNTER — Inpatient Hospital Stay (HOSPITAL_COMMUNITY): Payer: Medicaid Other

## 2018-09-23 DIAGNOSIS — M792 Neuralgia and neuritis, unspecified: Secondary | ICD-10-CM

## 2018-09-23 DIAGNOSIS — E1159 Type 2 diabetes mellitus with other circulatory complications: Secondary | ICD-10-CM

## 2018-09-23 DIAGNOSIS — I69391 Dysphagia following cerebral infarction: Secondary | ICD-10-CM

## 2018-09-23 LAB — GLUCOSE, CAPILLARY: Glucose-Capillary: 102 mg/dL — ABNORMAL HIGH (ref 70–99)

## 2018-09-23 MED ORDER — TOBRAMYCIN-DEXAMETHASONE 0.3-0.1 % OP SUSP: 1.0000 [drp] | OPHTHALMIC | 0 refills | Status: DC

## 2018-09-23 MED ORDER — NICOTINE 14 MG/24HR TD PT24: MEDICATED_PATCH | TRANSDERMAL | 0 refills | Status: DC

## 2018-09-23 MED ORDER — LISINOPRIL 40 MG PO TABS: 40.0000 mg | ORAL_TABLET | Freq: Every day | ORAL | 1 refills | Status: DC

## 2018-09-23 MED ORDER — TRAMADOL HCL 50 MG PO TABS: 50.0000 mg | ORAL_TABLET | Freq: Four times a day (QID) | ORAL | 0 refills | Status: DC | PRN

## 2018-09-23 MED ORDER — INSULIN DETEMIR 100 UNIT/ML FLEXPEN: 10.0000 [IU] | PEN_INJECTOR | Freq: Every day | SUBCUTANEOUS | 11 refills | Status: DC

## 2018-09-23 MED ORDER — ATORVASTATIN CALCIUM 80 MG PO TABS: 80.0000 mg | ORAL_TABLET | Freq: Every evening | ORAL | 0 refills | Status: DC

## 2018-09-23 MED ORDER — ACETAMINOPHEN 325 MG PO TABS: 650.0000 mg | ORAL_TABLET | ORAL | Status: DC | PRN

## 2018-09-23 MED ORDER — GLIPIZIDE 5 MG PO TABS: 5.0000 mg | ORAL_TABLET | Freq: Every day | ORAL | 1 refills | Status: DC

## 2018-09-23 MED ORDER — CLOPIDOGREL BISULFATE 75 MG PO TABS: 75.0000 mg | ORAL_TABLET | Freq: Every day | ORAL | 1 refills | Status: DC

## 2018-09-23 MED ORDER — GABAPENTIN 300 MG PO CAPS: 600.0000 mg | ORAL_CAPSULE | Freq: Two times a day (BID) | ORAL | 1 refills | Status: DC

## 2018-09-23 MED ORDER — RESOURCE THICKENUP CLEAR PO POWD: 1.0000 | ORAL | 1 refills | Status: DC | PRN

## 2018-09-23 NOTE — Progress Notes (Signed)
Social Work  Discharge Note  The overall goal for the admission was met for:   Discharge location: Yes-HOME WITH FAMILY WHO WILL BE PROVIDING 24 HR CARE  Length of Stay: Yes-16 DAYS  Discharge activity level: Yes-SUPERVISION-MIN LEVEL  Home/community participation: Yes  Services provided included: MD, RD, PT, OT, SLP, RN, CM, TR, Pharmacy, Neuropsych and SW  Financial Services: Medicaid  Follow-up services arranged: Home Health: [facility]-PT,OT, SP, DME: [facility]-WHEELCHAIR, ROLLING WALKER, 3 IN 1 AND TUB BENCH and Patient/Family has no preference for HH/DME agencies  Comments (or additional information):WIFE HERE DAY OF DISCHARGE TO LEARN PT'S CARE, SHE IS A CNA AND CARES FOR PATIENTS. AWARE PT WILL REQUIRE 24 HR PHYSICAL CARE AT DC AND SHE AND FAMILY PLAN TO PROVIDE THIS. SHE IS AWARE TO OBTIAN A PCP SHE WILL NEED TO GET THE MD ON MEDICAID CARE CHANGED AND SHE IS GOING TO DO THIS. BOTH COMFORTABLE WITH DC AND READY TO GO HOME  Patient/Family verbalized understanding of follow-up arrangements: Yes  Individual responsible for coordination of the follow-up plan: BEVERLY-WIFE  Confirmed correct DME delivered: Elease Hashimoto 09/23/2018    Elease Hashimoto

## 2018-09-23 NOTE — Progress Notes (Addendum)
Patient and spouse received discharge instructions from Dan Angiulli, PA-C with verbal understanding. Patient discharged to home with spouse and patient belongings. 

## 2018-09-23 NOTE — Progress Notes (Signed)
Speech Language Pathology Discharge Summary  Patient Details  Name: Evan E Ladson Sr. MRN: [id number] Date of Birth: 09/28/1955  Today's Date: 09/23/2018 SLP Individual Time: 0930-1030 SLP Individual Time Calculation (min): 60 min   Skilled Therapeutic Interventions:  Skilled treatment session focused on completing education with pt's wife. SLP provided printed handouts as well as demonstration of how to thicken liquids. Education provided on nectar thick liquid consistency and risk of aspiration when consuming thin liquids. Pt's wife with appropriate questions but this writer is concerned that wife and pt comprehended basic concept of thickening all liquids. As a result, this writer choose not to example water protocol. Pt's wife didn't appear able to comprehend detailed restrictions that would be involved with implementing protocol. It was safer for them to understand that all liquids should be thickened. Pt to receive HHST and can return for follow-up instrumental study before upgrade.    Patient has met 6 of 6 long term goals.  Patient to discharge at overall Supervision;Min level.   Clinical Impression/Discharge Summary:   Pt has made progress towards his goals and as a result is discharging having met 6 of 6 LTGs at the Min A to supervision level. Pt requires HHST follow to target diet safety, diet progress as well as to increase speech intelligibility.   Care Partner:  Caregiver Able to Provide Assistance: Yes  Type of Caregiver Assistance: Physical;Cognitive  Recommendation:  24 hour supervision/assistance;Home Health SLP  Rationale for SLP Follow Up: Maximize functional communication;Maximize swallowing safety;Reduce caregiver burden   Equipment:   Thickener  Reasons for discharge: Treatment goals met;Discharged from hospital   Patient/Family Agrees with Progress Made and Goals Achieved: Yes      09/23/2018, 9:49 AM    

## 2018-09-23 NOTE — Progress Notes (Signed)
Occupational Therapy Discharge Summary  Patient Details  Name: Evan Townsend. MRN: [id number] Date of Birth: May 24, 1956  Patient has met 74 of 14 long term goals due to improved activity tolerance, improved balance, postural control, ability to compensate for deficits, improved attention, improved awareness and improved coordination. Pt made excellent progress with BADLs and functional transfers during this admission. Pt completes bathing and LB dressing tasks with min A, and UB dressing with supervision. Pt requires CGA for toilet tranfsers and min A for toileting tasks.  Pt requires CGA for shower tranfsers.  Pt uses his LUE as a stabilizer at supervision level.  Pt noted with improved shoulder and elbow movement and trace finger and wrist flexion.  Pt's wife has participated in therapy and provides appropriate supervision/assistance. Patient to discharge at [facility] Assist level.  Patient's care partner is independent to provide the necessary physical and cognitive assistance at discharge.    Reasons goals not met: N/A All LTGs met Recommendation:  Patient will benefit from ongoing skilled OT services in home health setting to continue to advance functional skills in the area of BADL and Reduce care partner burden.  Equipment: tub transfer bench, BSC  Reasons for discharge: treatment goals met and discharge from hospital  Patient/family agrees with progress made and goals achieved: Yes  OT Discharge Vision Baseline Vision/History: Wears glasses Patient Visual Report: No change from baseline Vision Assessment?: No apparent visual deficits Ocular Range of Motion: Within Functional Limits Tracking/Visual Pursuits: Able to track stimulus in all quads without difficulty Saccades: Within functional limits Visual Fields: No apparent deficits Perception  Perception: Within Functional Limits Praxis Praxis: Intact Cognition Overall Cognitive Status: Impaired/Different from  baseline Arousal/Alertness: Awake/alert Orientation Level: Oriented X4 Attention: Selective Sustained Attention: Appears intact Selective Attention: Impaired Selective Attention Impairment: Verbal complex;Functional complex Memory: Appears intact Awareness: Impaired Awareness Impairment: Emergent impairment;Anticipatory impairment Behaviors: Impulsive Safety/Judgment: Appears intact Comments: impulsivity improved since eval Sensation Sensation Light Touch: Appears Intact Hot/Cold: Appears Intact Proprioception: Appears Intact Stereognosis: Not tested Coordination Gross Motor Movements are Fluid and Coordinated: No Fine Motor Movements are Fluid and Coordinated: No Heel Shin Test: LLE impaired, RLE intact Motor  Motor Motor: Hemiplegia Motor - Skilled Clinical Observations: L hemi, UE>LE Motor - Discharge Observations: L hemi remains, UE>LE    Trunk/Postural Assessment  Cervical Assessment Cervical Assessment: Within Functional Limits Thoracic Assessment Thoracic Assessment: Within Functional Limits Lumbar Assessment Lumbar Assessment: Within Functional Limits Postural Control Postural Control: Deficits on evaluation(delayed/insufficient in standing )  Balance Static Sitting Balance Static Sitting - Balance Support: No upper extremity supported;Feet supported Static Sitting - Level of Assistance: 5: Stand by assistance Dynamic Sitting Balance Dynamic Sitting - Balance Support: No upper extremity supported;Feet supported Dynamic Sitting - Level of Assistance: 5: Stand by assistance Extremity/Trunk Assessment RUE Assessment RUE Assessment: Within Functional Limits LUE Assessment LUE Assessment: Exceptions to [facility] Passive Range of Motion (PROM) Comments: WFL Active Range of Motion (AROM) Comments: 0-80 shoulder, 0-100 elbow General Strength Comments: 1/5 LUE Body System: Neuro Brunstrum levels for arm and hand: Arm;Hand Brunstrum level for arm: Stage IV Movement is  deviating from synergy Brunstrum level for hand: Stage II Synergy is developing   Leroy Libman 09/23/2018, 11:54 AM

## 2018-09-23 NOTE — Progress Notes (Signed)
[place] PHYSICAL MEDICINE & REHABILITATION PROGRESS NOTE   Subjective/Complaints: Patient seen sitting up in his chair this morning.  Wife at bedside.  He is upset about discharge today.  Wife has questions regarding discharge appointments and DME.  ROS-denies CP, SOB, N/V/D  Objective:   No results found. No results for input(s): WBC, HGB, HCT, PLT in the last 72 hours. No results for input(s): NA, K, CL, CO2, GLUCOSE, BUN, CREATININE, CALCIUM in the last 72 hours.  Intake/Output Summary (Last 24 hours) at 09/23/2018 1234 Last data filed at 09/23/2018 0849 Gross per 24 hour  Intake 480 ml  Output -  Net 480 ml     Physical Exam: Vital Signs Blood pressure (!) 145/79, pulse (!) 51, temperature (!) 97.4 F (36.3 C), resp. rate 18, height 5\' 6"  (1.676 m), weight 65.9 kg, SpO2 100 %.   General: NAD.  Vital signs reviewed.   Heart: Bradycardic.  Regular rhythm. Lungs: Clear to auscultation, breathing unlabored, no rales or wheezes Abdomen: Positive bowel sounds, nondistended Skin: No evidence of breakdown, no evidence of rash Neurologic: Alert. Motor: 5/5 grossly RUE/RLE LLE: 2+- 3-/5 proximal to distal  Musculoskeletal: No edema no tenderness in extremities Psych: Normal mood.  Normal behavior.  Assessment/Plan: 1. 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  Physiatrist is providing close team supervision and 24 hour management of active medical problems listed below.  Physiatrist and rehab team continue to assess barriers to discharge/monitor patient progress toward functional and medical goals  Care Tool:  Bathing    Body parts bathed by patient: Left arm, Chest, Abdomen, Front perineal area, Right upper leg, Left upper leg, Face, Right lower leg, Left lower leg, Buttocks, Right arm   Body parts bathed by helper: Right arm, Buttocks      Bathing assist Assist Level: Minimal Assistance - Patient > 75%     Upper Body Dressing/Undressing Upper body dressing   What is the patient wearing?: Pull over shirt    Upper body assist Assist Level: Supervision/Verbal cueing    Lower Body Dressing/Undressing Lower body dressing      What is the patient wearing?: Underwear/pull up, Pants     Lower body assist Assist for lower body dressing: Contact Guard/Touching assist     Toileting Toileting    Toileting assist Assist for toileting: Minimal Assistance - Patient > 75%     Transfers Chair/bed transfer  Transfers assist     Chair/bed transfer assist level: Supervision/Verbal cueing     Locomotion Ambulation   Ambulation assist      Assist level: Contact Guard/Touching assist Assistive device: Walker-rolling Max distance: 75'   Walk 10 feet activity   Assist     Assist level: Contact Guard/Touching assist Assistive device: Walker-rolling, Orthosis(LUE orthosis)   Walk 50 feet activity   Assist Walk 50 feet with 2 turns activity did not occur: Safety/medical concerns  Assist level: Contact Guard/Touching assist Assistive device: Walker-rolling    Walk 150 feet activity   Assist Walk 150 feet activity did not occur: Safety/medical concerns         Walk 10 feet on uneven surface  activity   Assist Walk 10 feet on uneven surfaces activity did not occur: Safety/medical concerns         Wheelchair     Assist Will patient use wheelchair at discharge?: Yes Type of Wheelchair: Manual  Wheelchair assist level: Supervision/Verbal cueing Max wheelchair distance: 150'    Wheelchair 50 feet with 2 turns activity    Assist        Assist Level: Supervision/Verbal cueing   Wheelchair 150 feet activity     Assist     Assist Level: Supervision/Verbal cueing    Medical Problem List and Plan: 1.   Left side weakness with dysarthria/dysphagia secondary to lacunar  infarction in the posterior limb right internal capsule            DC today, patient to follow-up for transitional care management in 1 to 2 weeks post discharge  Notes reviewed-stroke, images reviewed- small right infarcts, labs reviewed 2.  DVT Prophylaxis/Anticoagulation: subcutaneous Lovenox. Monitor for any bleeding episodes 3. Pain Management: Neurontin 600 mg 3 times a day reduced to twice daily given creatinine clearance,tramadol as needed             -support while bed/chair to reduce sublux 4. Mood:  Provide emotional support 5. Neuropsych: This patient is capable of making decisions on his own behalf. 6. Skin/Wound Care:  Routine skin checks 7. Fluids/Electrolytes/Nutrition:  Routine in and out's 8. Dysphagia. Dysphagia #3 nectar thick liquids. Follow-up speech therapy, advance as tolerated, improving per SLP   9. Diabetes mellitus with peripheral neuropathy. Hemoglobin A1c 10.3. Glucotrol 10 mg daily reduce to 5mg ,  Reduce levimir to 10mg  Qhs 1/31 Check blood sugars before meals and at bedtime. Diabetic teaching CBG (last 3)  Recent Labs    09/22/18 1650 09/22/18 2118 09/23/18 0644  GLUCAP 129* 168* 102*   Labile on 2/7 10. Hypertension. Lisinopril 40 mg daily. Vitals:   09/22/18 1342 09/23/18 0601  BP: (!) 141/79 (!) 145/79  Pulse: (!) 48 (!) 51  Resp: 18   Temp: (!) 97.4 F (36.3 C)   SpO2: 100% 100%   Relatively controlled on 2/7 11. Hyperlipidemia. Lipitor 12. Tobacco as well as polysubstance abuse. NicoDerm patch. Provide counseling     LOS: 17 days A FACE TO FACE EVALUATION WAS PERFORMED  Xuan Mateus Karis Juba 09/23/2018, 12:34 PM

## 2018-09-23 NOTE — Progress Notes (Signed)
Occupational Therapy Session Note  Patient Details  Name: Evan OLESON Sr. MRN: [id number] Date of Birth: August 10, 1956  Today's Date: 09/23/2018 OT Individual Time: 1045-1130 OT Individual Time Calculation (min): 45 min  and Today's Date: 09/23/2018 OT Missed Time: 30 Minutes Missed Time Reason: Other (comment)(pt discharge)   Short Term Goals: Week 2:  OT Short Term Goal 1 (Week 2): Pt will perform toilet transfers with min A and min verbal cues for safety OT Short Term Goal 1 - Progress (Week 2): Met OT Short Term Goal 2 (Week 2): Pt will perform toileting tasks with mod A sit<>stand for clothing management OT Short Term Goal 2 - Progress (Week 2): Met OT Short Term Goal 3 (Week 2): Pt will don underpants and pant with min A OT Short Term Goal 3 - Progress (Week 2): Met OT Short Term Goal 4 (Week 2): Pt will don shoes and fasten with shoe buttrons with min A OT Short Term Goal 4 - Progress (Week 2): Discontinued (comment)  Skilled Therapeutic Interventions/Progress Updates:    Pt engaged in ongoing BADL retrainng and family educaiton.  Pt's wife present.  Pt completed bathing at shower level and dressing with sit<>stand stand from w/c.  Pt's wife provided appropriate level of assistance/supervisoin.  Pt requires min A for bathing and LB dressing tasks.  Pt requires CGA for all functional transfers.  Pt practiced tub bench transfers with CGA from wife.  Pt's transportation arrived and pt/wife eager to discharge and missed 30 mins skilled OT services.  Pt/wife pleased with progress and ready for discharge.   Therapy Documentation Precautions:  Precautions Precautions: Fall Restrictions Weight Bearing Restrictions: No General: General OT Amount of Missed Time: 30 Minutes  Pain: Pain Assessment Pain Scale: 0-10 Pain Score: 0-No pain   Therapy/Group: Individual Therapy  Leroy Libman 09/23/2018, 12:06 PM

## 2018-09-25 DIAGNOSIS — I69354 Hemiplegia and hemiparesis following cerebral infarction affecting left non-dominant side: Secondary | ICD-10-CM | POA: Diagnosis not present

## 2018-09-26 ENCOUNTER — Telehealth: Payer: Self-pay | Admitting: *Deleted

## 2018-09-26 DIAGNOSIS — I69354 Hemiplegia and hemiparesis following cerebral infarction affecting left non-dominant side: Secondary | ICD-10-CM | POA: Diagnosis not present

## 2018-09-26 NOTE — Telephone Encounter (Signed)
Chris PT [facility] called for POC of 3wk1, 2wk2.  Approval given.

## 2018-09-27 ENCOUNTER — Telehealth: Payer: Self-pay | Admitting: *Deleted

## 2018-09-27 DIAGNOSIS — I69354 Hemiplegia and hemiparesis following cerebral infarction affecting left non-dominant side: Secondary | ICD-10-CM | POA: Diagnosis not present

## 2018-09-27 NOTE — Telephone Encounter (Signed)
Amy ST for [facility] called for 2wk3 POC and verify the MD signing: Kirsteins.  Approval given.

## 2018-09-27 NOTE — Telephone Encounter (Signed)
Transitional care call 1st attempt  Contacted Wife Meriam Sprague, listed on Social work note as [employer] [phone].  Went to voicemail, Left message for patient to call us back

## 2018-09-28 ENCOUNTER — Telehealth: Payer: Self-pay

## 2018-09-28 NOTE — Telephone Encounter (Signed)
Darral Dash, OT from [facility] called requesting verbal orders for HHOT 1wk1, 2wk3. Orders approved and given per discharge summary.

## 2018-09-29 ENCOUNTER — Telehealth: Payer: Self-pay | Admitting: *Deleted

## 2018-09-29 NOTE — Telephone Encounter (Signed)
Patient converted to hospital follow up

## 2018-09-29 NOTE — Telephone Encounter (Signed)
That is fine no therapy restrictions, patient will follow-up with his PCP.  We let people exercise with that blood pressure all the time in the hospital

## 2018-09-29 NOTE — Telephone Encounter (Signed)
[name] ST called to report that when she was seeing Evan Townsend, his bp was 160/78 and she took again before she left and it was 164/86 w/ HR 54.  Please advise.

## 2018-09-30 DIAGNOSIS — I69354 Hemiplegia and hemiparesis following cerebral infarction affecting left non-dominant side: Secondary | ICD-10-CM | POA: Diagnosis not present

## 2018-09-30 NOTE — Telephone Encounter (Signed)
Amy ST notified.

## 2018-10-04 ENCOUNTER — Telehealth: Payer: Self-pay | Admitting: *Deleted

## 2018-10-04 DIAGNOSIS — I69354 Hemiplegia and hemiparesis following cerebral infarction affecting left non-dominant side: Secondary | ICD-10-CM | POA: Diagnosis not present

## 2018-10-04 NOTE — Telephone Encounter (Signed)
Tommy PTA called to report that Evan Townsend fell while trying to get into the car on his own.  He was assisted up and no injury noted. FYI only.

## 2018-10-06 ENCOUNTER — Telehealth: Payer: Self-pay

## 2018-10-06 ENCOUNTER — Encounter: Payer: Medicaid Other | Attending: Physical Medicine & Rehabilitation

## 2018-10-06 ENCOUNTER — Inpatient Hospital Stay: Payer: Medicaid Other | Admitting: Physical Medicine & Rehabilitation

## 2018-10-06 DIAGNOSIS — I69354 Hemiplegia and hemiparesis following cerebral infarction affecting left non-dominant side: Secondary | ICD-10-CM | POA: Diagnosis not present

## 2018-10-06 NOTE — Telephone Encounter (Signed)
Amy, ST/[facility] called requesting verbal order to make up 2 visits at the end of plan of care that was missed due to illness on her part. Orders approved and given.

## 2018-10-10 DIAGNOSIS — I69354 Hemiplegia and hemiparesis following cerebral infarction affecting left non-dominant side: Secondary | ICD-10-CM | POA: Diagnosis not present

## 2018-10-10 DIAGNOSIS — R1310 Dysphagia, unspecified: Secondary | ICD-10-CM | POA: Diagnosis not present

## 2018-10-10 DIAGNOSIS — Z720 Tobacco use: Secondary | ICD-10-CM

## 2018-10-10 DIAGNOSIS — I10 Essential (primary) hypertension: Secondary | ICD-10-CM

## 2018-10-10 DIAGNOSIS — E119 Type 2 diabetes mellitus without complications: Secondary | ICD-10-CM

## 2018-10-10 DIAGNOSIS — Z7902 Long term (current) use of antithrombotics/antiplatelets: Secondary | ICD-10-CM

## 2018-10-10 DIAGNOSIS — Z794 Long term (current) use of insulin: Secondary | ICD-10-CM

## 2018-10-10 DIAGNOSIS — E785 Hyperlipidemia, unspecified: Secondary | ICD-10-CM

## 2018-10-10 DIAGNOSIS — Z7982 Long term (current) use of aspirin: Secondary | ICD-10-CM

## 2018-10-10 DIAGNOSIS — I69391 Dysphagia following cerebral infarction: Secondary | ICD-10-CM | POA: Diagnosis not present

## 2018-10-10 DIAGNOSIS — I69322 Dysarthria following cerebral infarction: Secondary | ICD-10-CM | POA: Diagnosis not present

## 2018-10-11 ENCOUNTER — Telehealth: Payer: Self-pay | Admitting: *Deleted

## 2018-10-11 NOTE — Telephone Encounter (Signed)
Physical therapist left a message reporting elevated B/P 174/88 followed by 176/92, heart rate 56, no auscultatory abnormalities. FYI

## 2018-10-11 NOTE — Telephone Encounter (Signed)
Amy ST called back since no one had heard back from the call yesterday(2/24/) about Mr Surman's BP.  I spoke with her and re-read Dr Wynn Banker reply from her call on 2/13 about his BP.  Apparently she says he has not followed up with the PCP as they were wanting to change primary and not go back to the one he was seeing, so nothing has been addressed concerning his higher BP's and now they are in the 170's over 90's.  She is going to call the [facility] and encourage them to not delay on the scheduling an appt with PCP, but for now is looking for more directions concerning BPs.

## 2018-10-11 NOTE — Telephone Encounter (Signed)
No new recommendation , pt will need to f/u with IM, no PT restriction

## 2018-10-11 NOTE — Telephone Encounter (Signed)
This BP is not unusual in hospital and not a contraindication to theraoy.  Some Neurologist recommend allowing BP to run high for up to 6 wks post CVA

## 2018-10-13 DIAGNOSIS — I69354 Hemiplegia and hemiparesis following cerebral infarction affecting left non-dominant side: Secondary | ICD-10-CM | POA: Diagnosis not present

## 2018-10-14 DIAGNOSIS — I69354 Hemiplegia and hemiparesis following cerebral infarction affecting left non-dominant side: Secondary | ICD-10-CM | POA: Diagnosis not present

## 2018-10-15 DIAGNOSIS — Z8673 Personal history of transient ischemic attack (TIA), and cerebral infarction without residual deficits: Secondary | ICD-10-CM | POA: Diagnosis not present

## 2018-10-15 DIAGNOSIS — R001 Bradycardia, unspecified: Secondary | ICD-10-CM | POA: Insufficient documentation

## 2018-10-15 DIAGNOSIS — I4510 Unspecified right bundle-branch block: Secondary | ICD-10-CM | POA: Diagnosis not present

## 2018-10-15 DIAGNOSIS — I10 Essential (primary) hypertension: Secondary | ICD-10-CM | POA: Diagnosis not present

## 2018-10-15 DIAGNOSIS — R809 Proteinuria, unspecified: Secondary | ICD-10-CM | POA: Diagnosis not present

## 2018-10-15 DIAGNOSIS — Z794 Long term (current) use of insulin: Secondary | ICD-10-CM | POA: Diagnosis not present

## 2018-10-15 DIAGNOSIS — E1142 Type 2 diabetes mellitus with diabetic polyneuropathy: Secondary | ICD-10-CM | POA: Diagnosis not present

## 2018-10-15 DIAGNOSIS — E1165 Type 2 diabetes mellitus with hyperglycemia: Secondary | ICD-10-CM | POA: Diagnosis not present

## 2018-10-18 DIAGNOSIS — I69354 Hemiplegia and hemiparesis following cerebral infarction affecting left non-dominant side: Secondary | ICD-10-CM | POA: Diagnosis not present

## 2018-10-19 DIAGNOSIS — I69354 Hemiplegia and hemiparesis following cerebral infarction affecting left non-dominant side: Secondary | ICD-10-CM | POA: Diagnosis not present

## 2018-10-20 ENCOUNTER — Telehealth: Payer: Self-pay

## 2018-10-20 DIAGNOSIS — E1165 Type 2 diabetes mellitus with hyperglycemia: Secondary | ICD-10-CM | POA: Diagnosis not present

## 2018-10-20 DIAGNOSIS — Z794 Long term (current) use of insulin: Secondary | ICD-10-CM | POA: Diagnosis not present

## 2018-10-20 DIAGNOSIS — K089 Disorder of teeth and supporting structures, unspecified: Secondary | ICD-10-CM | POA: Insufficient documentation

## 2018-10-20 DIAGNOSIS — I10 Essential (primary) hypertension: Secondary | ICD-10-CM | POA: Diagnosis not present

## 2018-10-20 DIAGNOSIS — I639 Cerebral infarction, unspecified: Secondary | ICD-10-CM | POA: Diagnosis not present

## 2018-10-20 DIAGNOSIS — Z596 Low income: Secondary | ICD-10-CM | POA: Diagnosis not present

## 2018-10-20 NOTE — Telephone Encounter (Signed)
Amy ST [facility] called requesting verbal orders for 1 visit due to a missed visit and an additional 2 visits as well.  Noted that according to the patients appointment record, he was a no-show for his hospital follow up visit.  TC calls were made after patient was originally discharged from hospital but was unable to establish contact with the patient so TC was converted to a Hospital follow up.  Date of FU visit was scheduled for 10-06-2018.  Unsure if ok to approve those orders given we haven't seen the patient since his time of discharge.  Please advise.

## 2018-10-21 ENCOUNTER — Telehealth: Payer: Self-pay

## 2018-10-21 DIAGNOSIS — I639 Cerebral infarction, unspecified: Secondary | ICD-10-CM | POA: Diagnosis not present

## 2018-10-21 DIAGNOSIS — I69354 Hemiplegia and hemiparesis following cerebral infarction affecting left non-dominant side: Secondary | ICD-10-CM | POA: Diagnosis not present

## 2018-10-21 NOTE — Telephone Encounter (Signed)
Paulla Fore, ST from [facility] called stating patients visit was cancelled last week due to lack of insurance coverage and missed today due to him not being home. She is requesting verbal order to make up those days and also to upgrade his liquid consistence to nectar thick to thin.

## 2018-10-26 DIAGNOSIS — I69354 Hemiplegia and hemiparesis following cerebral infarction affecting left non-dominant side: Secondary | ICD-10-CM | POA: Diagnosis not present

## 2018-10-26 NOTE — Telephone Encounter (Signed)
Ok to approve SLP orders

## 2018-10-26 NOTE — Telephone Encounter (Signed)
Amy notified of approved orders

## 2018-10-26 NOTE — Telephone Encounter (Signed)
Ok to make up missed vist and upgrade diet

## 2018-10-26 NOTE — Telephone Encounter (Signed)
Amy notified ok at make up missed visits.

## 2018-10-27 DIAGNOSIS — I69354 Hemiplegia and hemiparesis following cerebral infarction affecting left non-dominant side: Secondary | ICD-10-CM | POA: Diagnosis not present

## 2018-11-08 ENCOUNTER — Telehealth: Payer: Self-pay | Admitting: Neurology

## 2018-11-08 NOTE — Telephone Encounter (Signed)
Pt's wife/Evan Townsend called to confirm appt on 3/26. She is not wanting to r/s nor is she wanting telemedicine, she is wanting him seen by Dr Pearlean Brownie. Please call to advise.

## 2018-11-09 NOTE — Telephone Encounter (Signed)
I spoke to patient and wife. They are willing for telehealth visit via facetime

## 2018-11-10 ENCOUNTER — Encounter: Payer: Self-pay | Admitting: Neurology

## 2018-11-10 ENCOUNTER — Ambulatory Visit (INDEPENDENT_AMBULATORY_CARE_PROVIDER_SITE_OTHER): Payer: Medicaid Other | Admitting: Neurology

## 2018-11-10 ENCOUNTER — Other Ambulatory Visit: Payer: Self-pay

## 2018-11-10 DIAGNOSIS — I6381 Other cerebral infarction due to occlusion or stenosis of small artery: Secondary | ICD-10-CM

## 2018-11-10 NOTE — Progress Notes (Signed)
Virtual Visit via Video Note  I connected with Evan FELIZ Sr. on 11/10/18 at  2:00 PM EDT by a video enabled telemedicine application and verified that I am speaking with the correct person using two identifiers.   I discussed the limitations of evaluation and management by telemedicine and the availability of in person appointments. The patient expressed understanding and agreed to proceed.  History of Present Illness: 63 year old Caucasian male seen today for first virtual telephonic consultation visit.  He is accompanied by his girlfriend.  He developed sudden onset of slurred speech and facial numbness and went to her [facility] where he was admitted.  Discharge summary from the hospital is not available but as per referral notes and review of imaging studies he had a small thalamic infarct.  Transthoracic echo as well as carotid ultrasound were unremarkable.  He was started on aspirin and Plavix and recommended aggressive risk factor modification.  His slurred speech and facial weakness apparently have improved.  He has had no recurrent stroke or TIA symptoms.   Observations/Objective: I have personally reviewed hospital stroke evaluation tests and imaging films  Assessment and Plan: 63 year old Caucasian male with thalamic infarct in February 2020 due to small vessel disease.  Vascular risk factors of hypertension, hyperlipidemia and diabetes.  He is doing well without any significant residual deficits. I had a long d/w patient about his recent stroke, risk for recurrent stroke/TIAs, personally independently reviewed imaging studies and stroke evaluation results and answered questions.Continue aspirin 81 mg daily  for secondary stroke prevention and maintain strict control of hypertension with blood pressure goal below 130/90, diabetes with hemoglobin A1c goal below 6.5% and lipids with LDL cholesterol goal below 70 mg/dL. I also advised the patient to eat a healthy diet with plenty of  whole grains, cereals, fruits and vegetables, exercise regularly and maintain ideal body weight   Follow Up Instructions: Followup in the future with my nurse practitioner in 3 months or call earlier if needed   I discussed the assessment and treatment plan with the patient. The patient was provided an opportunity to ask questions and all were answered. The patient agreed with the plan and demonstrated an understanding of the instructions.   The patient was advised to call back or seek an in-person evaluation if the symptoms worsen or if the condition fails to improve as anticipated.  I provided of non-face-to-face time during this encounter.   Delia Heady, MD

## 2018-11-11 DIAGNOSIS — E1165 Type 2 diabetes mellitus with hyperglycemia: Secondary | ICD-10-CM | POA: Diagnosis not present

## 2018-11-12 DIAGNOSIS — E1165 Type 2 diabetes mellitus with hyperglycemia: Secondary | ICD-10-CM | POA: Diagnosis not present

## 2018-11-13 DIAGNOSIS — E1165 Type 2 diabetes mellitus with hyperglycemia: Secondary | ICD-10-CM | POA: Diagnosis not present

## 2018-11-14 DIAGNOSIS — E1165 Type 2 diabetes mellitus with hyperglycemia: Secondary | ICD-10-CM | POA: Diagnosis not present

## 2018-11-16 DIAGNOSIS — E1165 Type 2 diabetes mellitus with hyperglycemia: Secondary | ICD-10-CM | POA: Diagnosis not present

## 2018-11-17 DIAGNOSIS — E1165 Type 2 diabetes mellitus with hyperglycemia: Secondary | ICD-10-CM | POA: Diagnosis not present

## 2018-11-18 DIAGNOSIS — E1165 Type 2 diabetes mellitus with hyperglycemia: Secondary | ICD-10-CM | POA: Diagnosis not present

## 2018-11-19 DIAGNOSIS — E1165 Type 2 diabetes mellitus with hyperglycemia: Secondary | ICD-10-CM | POA: Diagnosis not present

## 2018-11-20 DIAGNOSIS — E1165 Type 2 diabetes mellitus with hyperglycemia: Secondary | ICD-10-CM | POA: Diagnosis not present

## 2018-11-21 DIAGNOSIS — E1165 Type 2 diabetes mellitus with hyperglycemia: Secondary | ICD-10-CM | POA: Diagnosis not present

## 2018-11-21 DIAGNOSIS — I639 Cerebral infarction, unspecified: Secondary | ICD-10-CM | POA: Diagnosis not present

## 2018-11-22 DIAGNOSIS — E1165 Type 2 diabetes mellitus with hyperglycemia: Secondary | ICD-10-CM | POA: Diagnosis not present

## 2018-11-23 DIAGNOSIS — E1165 Type 2 diabetes mellitus with hyperglycemia: Secondary | ICD-10-CM | POA: Diagnosis not present

## 2018-11-24 DIAGNOSIS — E1165 Type 2 diabetes mellitus with hyperglycemia: Secondary | ICD-10-CM | POA: Diagnosis not present

## 2018-11-25 ENCOUNTER — Other Ambulatory Visit: Payer: Self-pay

## 2018-11-25 ENCOUNTER — Ambulatory Visit (INDEPENDENT_AMBULATORY_CARE_PROVIDER_SITE_OTHER): Payer: Self-pay | Admitting: Family Medicine

## 2018-11-25 ENCOUNTER — Encounter: Payer: Self-pay | Admitting: Family Medicine

## 2018-11-25 VITALS — BP 130/80 | HR 74 | Temp 97.9°F | Resp 16 | Ht 65.0 in | Wt 149.0 lb

## 2018-11-25 DIAGNOSIS — K219 Gastro-esophageal reflux disease without esophagitis: Secondary | ICD-10-CM

## 2018-11-25 DIAGNOSIS — I69354 Hemiplegia and hemiparesis following cerebral infarction affecting left non-dominant side: Secondary | ICD-10-CM | POA: Insufficient documentation

## 2018-11-25 DIAGNOSIS — M792 Neuralgia and neuritis, unspecified: Secondary | ICD-10-CM

## 2018-11-25 DIAGNOSIS — M79605 Pain in left leg: Secondary | ICD-10-CM

## 2018-11-25 DIAGNOSIS — E1159 Type 2 diabetes mellitus with other circulatory complications: Secondary | ICD-10-CM

## 2018-11-25 DIAGNOSIS — E1165 Type 2 diabetes mellitus with hyperglycemia: Secondary | ICD-10-CM | POA: Diagnosis not present

## 2018-11-25 DIAGNOSIS — Z794 Long term (current) use of insulin: Secondary | ICD-10-CM

## 2018-11-25 DIAGNOSIS — I10 Essential (primary) hypertension: Secondary | ICD-10-CM

## 2018-11-25 DIAGNOSIS — E785 Hyperlipidemia, unspecified: Secondary | ICD-10-CM

## 2018-11-25 MED ORDER — FAMOTIDINE 20 MG PO TABS: 20.0000 mg | ORAL_TABLET | Freq: Two times a day (BID) | ORAL | 0 refills | Status: DC

## 2018-11-25 NOTE — Progress Notes (Signed)
Name: Evan AnoDuncan E Mulhearn Sr.   MRN: [id number]    DOB: 03/11/1956   Date:11/25/2018       Progress Note  Subjective  Chief Complaint  Chief Complaint  Patient presents with  . Establish Care  . Foot Swelling    left since stroke  . Hyperlipidemia  . Hypertension  . Gastroesophageal Reflux    I connected with Evan AnoDuncan E Loser Sr. on 11/25/18 at  9:00 AM EDT by a video enabled telemedicine application and verified that I am speaking with the correct person using two identifiers.  I discussed the limitations of evaluation and management by telemedicine and the availability of in person appointments. The patient expressed understanding and agreed to proceed. Staff also discussed with the patient that there may be a patient responsible charge related to this service. Patient Location: In Car outside of our office - request that he not come in due to concern for COVID-19 pandemic and his high risk for complications if he contracts the virus. Provider Location: Office Additional Individuals present: Wife  HPI  HTN: Taking amlodipine 10mg , Lisinopril 40mg , BP at goal today.  Denies chest pain, shortness of breath, headaches, blurred vision. No concerns.  HLD: Taking atorvastatin daily, no chest pain, shortness of breath, or myalgias.  Hx Stroke/LEFT Hemiplegia: Stroke in January 2019, affecting LEFT side function; wearing brace on the LEFT LE. Seeing Dr. Pearlean BrownieSethi with neurology - last visit was 11/10/2018 - taking Xarelto and 81mg  ASA.  - LEFT Ankle Swelling: On side that he had a stroke.  Has had swelling for several months, goes away overnight with elevation, wearing leg brace during the day.  No tenderness or erythema. - LEFT Hand Pain: Feels tight like it is swollen, even though it is not.  He is taking tramadol PRN, there is no swelling in the upper arm.   Indigestion:  He notes for the last several months he has burping and regurgitation with sour taste.  He denies vomiting, blood in  stool/dark and tarry stool, difficulty swallowing or abdominal pain.  He is taking plavix and 81mg  ASA. He has tried tums and this has not seemed to help.  Diabetes mellitus type 2 Checking sugars?  yes How often? daily Range (low to high) over last two weeks:  85-200. Does patient feel additional teaching/training would be helpful?  no  Have they attended Diabetes education classes? yes Trying to limit white bread, white rice, white potatoes, sweets?  yes Trying to limit sweetened drinks like iced tea, soft drinks, sports drinks, fruit juices?  limits, but does still drink them sometimes. Checking feet every day/night?  yes Last eye exam:  Unknown - will refer today Denies: Polyuria, polydipsia, polyphagia, vision changes.  Taking gabapentin for BLE neuropathy. Most recent A1C: 10/20/2018 with [facility] - was 7.8%. Last CMP Results : is not due for repeat today Urine Micro UTD? Yes Current Medication Management: Diabetic Medications: Levimir 18 units (increased recently due to not wanting to take Metformin), also on glipizide 5mg . Metformin caused diarrhea, so we will avoid per patient request.  No due for A1C check for 2 more months. ACEI/ARB: Yes Statin: Yes Aspirin therapy: Yes  Patient Active Problem List   Diagnosis Date Noted  . Hemiplegia and hemiparesis following cerebral infarction affecting left non-dominant side (HCC) 11/25/2018  . Dysphagia, post-stroke   . Neuropathic pain   . Lacunar infarct, acute (HCC) 09/06/2018  . TIA (transient ischemic attack) 09/01/2018  . Left leg pain 01/13/2018  . Hyperlipidemia  02/11/2017  . Essential hypertension 11/10/2016  . Diabetes (HCC) 11/10/2016    No past surgical history on file.  Family History  Problem Relation Age of Onset  . Hypertension Mother   . Hypertension Father   . Diabetes Father     Social History   Socioeconomic History  . Marital status: Married    Spouse name: Not on file  . Number of children: Not on file   . Years of education: Not on file  . Highest education level: Not on file  Occupational History  . Occupation: unemployed  Social Needs  . Financial resource strain: Very hard  . Food insecurity:    Worry: Never true    Inability: Never true  . Transportation needs:    Medical: Yes    Non-medical: Yes  Tobacco Use  . Smoking status: Current Every Day Smoker    Packs/day: 0.25    Types: Cigarettes    Start date: 96  . Smokeless tobacco: Never Used  Substance and Sexual Activity  . Alcohol use: Yes    Alcohol/week: 1.0 standard drinks    Types: 1 Cans of beer per week  . Drug use: No  . Sexual activity: Yes  Lifestyle  . Physical activity:    Days per week: 0 days    Minutes per session: Not on file  . Stress: Only a little  Relationships  . Social connections:    Talks on phone: More than three times a week    Gets together: Once a week    Attends religious service: More than 4 times per year    Active member of club or organization: No    Attends meetings of clubs or organizations: Never    Relationship status: Married  . Intimate partner violence:    Fear of current or ex partner: No    Emotionally abused: No    Physically abused: No    Forced sexual activity: No  Other Topics Concern  . Not on file  Social History Narrative  . Not on file     Current Outpatient Medications:  .  acetaminophen (TYLENOL) 325 MG tablet, Take 2 tablets (650 mg total) by mouth every 4 (four) hours as needed for mild pain (or temp > 37.5 C (99.5 F))., Disp: , Rfl:  .  amLODipine (NORVASC) 10 MG tablet, Take 10 mg by mouth daily., Disp: , Rfl:  .  aspirin 81 MG EC tablet, Take 1 tablet (81 mg total) by mouth daily., Disp: 30 tablet, Rfl:  .  atorvastatin (LIPITOR) 80 MG tablet, Take 1 tablet (80 mg total) by mouth every evening., Disp: 30 tablet, Rfl: 0 .  clopidogrel (PLAVIX) 75 MG tablet, Take 1 tablet (75 mg total) by mouth daily., Disp: 30 tablet, Rfl: 1 .  gabapentin  (NEURONTIN) 300 MG capsule, Take 2 capsules (600 mg total) by mouth 2 (two) times daily., Disp: 60 capsule, Rfl: 1 .  glipiZIDE (GLUCOTROL) 5 MG tablet, Take 1 tablet (5 mg total) by mouth daily before breakfast., Disp: 30 tablet, Rfl: 1 .  Insulin Detemir (LEVEMIR) 100 UNIT/ML Pen, Inject 10 Units into the skin daily., Disp: 15 mL, Rfl: 11 .  lisinopril (PRINIVIL,ZESTRIL) 40 MG tablet, Take 1 tablet (40 mg total) by mouth daily., Disp: 30 tablet, Rfl: 1 .  Maltodextrin-Xanthan Gum (RESOURCE THICKENUP CLEAR) POWD, Take 120 g by mouth as needed., Disp: 1 Can, Rfl: 1 .  nicotine (NICODERM CQ - DOSED IN MG/24 HOURS) 14 mg/24hr patch, 14  mg patch daily 2 weeks then 7 mg patch daily 3 weeks and stop, Disp: 28 patch, Rfl: 0 .  tobramycin-dexamethasone (TOBRADEX) ophthalmic solution, Place 1 drop into the left eye every 4 (four) hours while awake., Disp: 5 mL, Rfl: 0 .  traMADol (ULTRAM) 50 MG tablet, Take 1 tablet (50 mg total) by mouth every 6 (six) hours as needed for moderate pain., Disp: 20 tablet, Rfl: 0  No Known Allergies  I personally reviewed active problem list, medication list, allergies, family history, social history, health maintenance, notes from last encounter, lab results with the patient/caregiver today.   ROS  Constitutional: Negative for fever or weight change.  Respiratory: Negative for cough and shortness of breath.   Cardiovascular: Negative for chest pain or palpitations.  Gastrointestinal: Negative for abdominal pain, no bowel changes.  Musculoskeletal: Negative for gait problem; endorses pain in LLE and LUE as above. Skin: Negative for rash.  Neurological: Negative for dizziness or headache.  No other specific complaints in a complete review of systems (except as listed in HPI above).  Objective  Today's Vitals   11/25/18 0957 11/25/18 0959  BP: 130/80   Pulse: 74   Resp: 16   Temp: 97.9 F (36.6 C)   TempSrc: Oral   SpO2: 98%   Weight: 149 lb (67.6 kg)    Height:  (1.651 m)   PainSc:  4    Body mass index is 24.79 kg/m.   Body mass index is 24.79 kg/m.  Physical Exam  Constitutional: Patient appears well-developed and well-nourished. No distress.  HENT: Head: Normocephalic and atraumatic.  Neck: Normal range of motion. Pulmonary/Chest: Effort normal. No respiratory distress. Speaking in complete sentences Neurological: Pt is alert and oriented to person, place, and time. Coordination, speech and gait are normal.  Psychiatric: Patient has a normal mood and affect. behavior is normal. Judgment and thought content normal. Skin: Wife presses on LEFT ankle and there is no visible pitting, LLE does not appear edematous at this time. MSK: Limited AROM of the LLE and LUE, Left hand does have ability to open and close fully, however it takes time for him to accomplish both motions.  No results found for this or any previous visit (from the past 72 hour(s)).  PHQ2/9: Depression screen PHQ 2/9 11/25/2018  Decreased Interest 0  Down, Depressed, Hopeless 0  PHQ - 2 Score 0  Altered sleeping 0  Tired, decreased energy 0  Change in appetite 0  Feeling bad or failure about yourself  0  Trouble concentrating 0  Moving slowly or fidgety/restless 0  Suicidal thoughts 0  PHQ-9 Score 0  Difficult doing work/chores Not difficult at all   PHQ-2/9 Result is negative.    Fall Risk: Fall Risk  11/25/2018  Falls in the past year? 0  Number falls in past yr: 0  Injury with Fall? 0  Follow up Falls evaluation completed    Assessment & Plan  1. Gastroesophageal reflux disease without esophagitis - Will trial omeprazole for 2 months, switch to famotidine if possible after that. - omeprazole (PRILOSEC) 40 MG capsule; Take 1 capsule (40 mg total) by mouth daily.  Dispense: 30 capsule; Refill: 1  2. Hemiplegia and hemiparesis following cerebral infarction affecting left non-dominant side (HCC) - Stable, continue medication management, BP at  goal today, keep follow up with neurology  3. Type 2 diabetes mellitus with other circulatory complication, with long-term current use of insulin (HCC) - Continue current medications; advised to monitor skin for  breakdown, especially feet - wife does daily checks.  Encouraged low carb and low fat diet.  - We will refer to CCM and opthalmology per orders  4. Neuropathic pain - Continue gabapentin  5. Left leg pain - Wear leg brace, continue gabapentin, does not want home health due to COVId-19.  6. Hyperlipidemia, unspecified hyperlipidemia type - Continue atorvastatin.  I discussed the assessment and treatment plan with the patient. The patient was provided an opportunity to ask questions and all were answered. The patient agreed with the plan and demonstrated an understanding of the instructions.  The patient was advised to call back or seek an in-person evaluation if the symptoms worsen or if the condition fails to improve as anticipated.  I provided 32 minutes of non-face-to-face time during this encounter.

## 2018-11-26 DIAGNOSIS — E1165 Type 2 diabetes mellitus with hyperglycemia: Secondary | ICD-10-CM | POA: Diagnosis not present

## 2018-11-27 DIAGNOSIS — E1165 Type 2 diabetes mellitus with hyperglycemia: Secondary | ICD-10-CM | POA: Diagnosis not present

## 2018-11-28 DIAGNOSIS — E1165 Type 2 diabetes mellitus with hyperglycemia: Secondary | ICD-10-CM | POA: Diagnosis not present

## 2018-11-29 DIAGNOSIS — E1165 Type 2 diabetes mellitus with hyperglycemia: Secondary | ICD-10-CM | POA: Diagnosis not present

## 2018-11-30 ENCOUNTER — Ambulatory Visit: Payer: Self-pay

## 2018-11-30 DIAGNOSIS — E1159 Type 2 diabetes mellitus with other circulatory complications: Secondary | ICD-10-CM

## 2018-11-30 DIAGNOSIS — I10 Essential (primary) hypertension: Secondary | ICD-10-CM

## 2018-11-30 DIAGNOSIS — E1165 Type 2 diabetes mellitus with hyperglycemia: Secondary | ICD-10-CM | POA: Diagnosis not present

## 2018-11-30 DIAGNOSIS — Z794 Long term (current) use of insulin: Secondary | ICD-10-CM

## 2018-11-30 DIAGNOSIS — E785 Hyperlipidemia, unspecified: Secondary | ICD-10-CM

## 2018-11-30 DIAGNOSIS — G459 Transient cerebral ischemic attack, unspecified: Secondary | ICD-10-CM

## 2018-11-30 DIAGNOSIS — Z720 Tobacco use: Secondary | ICD-10-CM

## 2018-11-30 NOTE — Chronic Care Management (AMB) (Deleted)
  Care Management   Note  11/30/2018 Name: Evan NEVE Sr. MRN: [id number] DOB: 25-Apr-1956  *** is a *** year old *** who sees *** for primary care. *** asked the CCM team to consult the patient for ***. Patient has a history of but not limited to ***. Referral was placed ***. Patient's last office visit was ***.  Telephone outreach to patient today to introduce CCM services.  SDOH (Social Determinants of Health) screening performed today. See Care Plan Entry related to challenges with: {SDOH Challenges:22393}  [name] was given information about Care Management services today including:  1. Case Management services include personalized support from designated clinical staff supervised by a physician, including individualized plan of care and coordination with other care providers 2. 24/7 contact phone numbers for assistance for urgent and routine care needs. 3. The patient may stop CCM services at any time (effective at the end of the month) by phone call to the office staff.   {CCM CONSENT STATUS:22234}    Plan: ***  Signature ***

## 2018-11-30 NOTE — Progress Notes (Signed)
This encounter was created in error - please disregard.

## 2018-11-30 NOTE — Patient Instructions (Signed)
1. Thank You for allowing the CCM (Chronic Care Management) Team to assist you with your healthcare goals!! We look forward speaking with you next week. 2. Please bring ALL medications to your appointment! If you have a blood sugar meter or a blood pressure monitor at home, bring those as well.  3.  Contact the CCM Team if you have any question or need to reschedule your initial visit.  CCM (Chronic Care Management) Team   Yvone Neu RN, BSN Nurse Care Coordinator  [phone]  Karalee Height PharmD  Clinical Pharmacist  [phone]   Verna Czech, LCSW Clinical Social Worker [phone]  Evan Townsend was given information about Care Management services today including:  1. Case Management services includes personalized support from designated clinical staff supervised by his physician, including individualized plan of care and coordination with other care providers 2. 24/7 contact phone numbers for assistance for urgent and routine care needs. 3. The patient may stop case management services at any time by phone call to the office staff.  Patient agreed to services and verbal consent obtained.

## 2018-11-30 NOTE — Chronic Care Management (AMB) (Signed)
  Care Management   Note  11/30/2018 Name: CIEL ARRENDONDO Sr. MRN: [id number] DOB: 11-Dec-1955  Chauncy Lean. Justun Dufrene. is a 63 year old male who sees Maurice Small, FNP for primary care. Ms. Annye Asa asked the CCM team to consult the patient for care coordination and chronic case management related to DM, HTN, TIA, social and financial barriers to care. Patient has a history of but not limited to Tobacco abuse, HTN, TIA, DM and Hyperlipidemia. Referral was placed 11/25/2018 during last office visit.  Telephone outreach to patient and wife today to introduce CCM services.  SDOH (Social Determinants of Health) screening performed today. See Care Plan Entry related to challenges with: Financial Strain  Tobacco Use  Mr. Nettles was given information about Care Management services today including:  1. Case Management services include personalized support from designated clinical staff supervised by a physician, including individualized plan of care and coordination with other care providers 2. 24/7 contact phone numbers for assistance for urgent and routine care needs. 3. The patient may stop CCM services at any time (effective at the end of the month) by phone call to the office staff.   Patient agreed to services and verbal consent obtained.     Plan: CCM RN CM initial assessment scheduled for 12/06/2018 at 2:00. Patient will also be contacted by [facility] Pharmacist and CCM Clinic Social Worker  [place] E. Suzie Portela, RN, BSN Nurse Care Coordinator [facility] Care Management [phone]

## 2018-12-01 DIAGNOSIS — E1165 Type 2 diabetes mellitus with hyperglycemia: Secondary | ICD-10-CM | POA: Diagnosis not present

## 2018-12-02 DIAGNOSIS — E1165 Type 2 diabetes mellitus with hyperglycemia: Secondary | ICD-10-CM | POA: Diagnosis not present

## 2018-12-03 DIAGNOSIS — E1165 Type 2 diabetes mellitus with hyperglycemia: Secondary | ICD-10-CM | POA: Diagnosis not present

## 2018-12-04 DIAGNOSIS — E1165 Type 2 diabetes mellitus with hyperglycemia: Secondary | ICD-10-CM | POA: Diagnosis not present

## 2018-12-05 DIAGNOSIS — E1165 Type 2 diabetes mellitus with hyperglycemia: Secondary | ICD-10-CM | POA: Diagnosis not present

## 2018-12-06 ENCOUNTER — Ambulatory Visit: Payer: Self-pay | Admitting: Pharmacist

## 2018-12-06 ENCOUNTER — Telehealth: Payer: Self-pay

## 2018-12-06 ENCOUNTER — Ambulatory Visit: Payer: Self-pay

## 2018-12-06 ENCOUNTER — Encounter: Payer: Self-pay | Admitting: *Deleted

## 2018-12-06 ENCOUNTER — Ambulatory Visit: Payer: Self-pay | Admitting: *Deleted

## 2018-12-06 ENCOUNTER — Other Ambulatory Visit: Payer: Self-pay

## 2018-12-06 DIAGNOSIS — K089 Disorder of teeth and supporting structures, unspecified: Secondary | ICD-10-CM

## 2018-12-06 DIAGNOSIS — E1159 Type 2 diabetes mellitus with other circulatory complications: Secondary | ICD-10-CM

## 2018-12-06 DIAGNOSIS — Z794 Long term (current) use of insulin: Secondary | ICD-10-CM

## 2018-12-06 DIAGNOSIS — Z720 Tobacco use: Secondary | ICD-10-CM

## 2018-12-06 DIAGNOSIS — E785 Hyperlipidemia, unspecified: Secondary | ICD-10-CM

## 2018-12-06 DIAGNOSIS — I69354 Hemiplegia and hemiparesis following cerebral infarction affecting left non-dominant side: Secondary | ICD-10-CM

## 2018-12-06 DIAGNOSIS — I10 Essential (primary) hypertension: Secondary | ICD-10-CM

## 2018-12-06 DIAGNOSIS — E1165 Type 2 diabetes mellitus with hyperglycemia: Secondary | ICD-10-CM | POA: Diagnosis not present

## 2018-12-06 DIAGNOSIS — G459 Transient cerebral ischemic attack, unspecified: Secondary | ICD-10-CM

## 2018-12-06 NOTE — Chronic Care Management (AMB) (Signed)
Care Management   Initial Visit Note  12/06/2018 Name: Evan AnoDuncan E Caughlin Sr. MRN: [id number] DOB: 03/11/1956   Subjective: "Right now all I need is to get to a dentis"  Objective:  BP Readings from Last 3 Encounters:  11/25/18 130/80  09/23/18 (!) 145/79  09/06/18 124/63   Lab Results  Component Value Date   HGBA1C 10.3 (H) 02/10/2018     Assessment: Evan Marchuncan E. Laray Angerlston Sr. is a 63 year old malewho sees Maurice SmallEmily Boyce, FNP for primary care. Ms. Launa FlightBoyceasked the CCM team to consult the patient for care coordination and chronic case management related to DM, HTN, TIA, social and financial barriers to care. Patient has a history of but not limited to Tobacco abuse, HTN, TIA, DM and Hyperlipidemia. Referral was placed 11/25/2018 during last office visit. Today CCM RN CM followed up with Evan Townsend to discuss health goals and to complete a health assessment.  Review of patient status, including review of consultants reports, relevant laboratory and other test results, and collaboration with appropriate care team members and the patient's provider was performed as part of comprehensive patient evaluation and provision of chronic care management services.    2 Hospitalizations in 6 months  <no information>  Goals Addressed            This Visit's Progress   . I rellly need to see a dentist ASAP (pt-stated)       Current Barriers:  Marland Kitchen. Knowledge Deficits related to low cost dental clinics . Corporate treasurerinancial Constraints.   Nurse Case Manager Clinical Goal(s):  Marland Kitchen. Over the next 14 days, patient will verbalize understanding of plan for obtaining affordible dental care . Over the next 14 days, patient will [facility] ([facility] to recieve full dental care specifically addressing his tooth decay and pain  Interventions:   Assessed patients dental complaints . Provided education to patient re: importance of dental health and effects of poor dental  health/complications on chronic conditions . [facility]ated with [facility] needed appointment . Provided patient and/or caregiver with contact information  information about [facility] [phone].  Patient Self Care Activities:  . Attends all scheduled provider appointments . Calls provider office for new concerns or questions  . Engage with [facility] o [address] [place], [place] [address] o [phone]  Initial goal documentation      . My blood sugars are in the 180s-thats real good isn't it? (pt-stated)       Current Barriers:  Marland Kitchen. Knowledge Deficits related to basic Diabetes pathophysiology and self care/management . Knowledge Deficit related to DM as it relates to increased risk of stroke  Nurse Case Manager Clinical Goal(s):  Marland Kitchen. Over the next 14 days, patient will demonstrate improved adherence to prescribed treatment plan for diabetes self care/management as evidenced by checking blood glucose levels as prescribed, adhering to ADA/low carb diet, daily exercise as tolerated, and medication adherence. . Over the next 14 days, patient will review stroke risk information and be prepared to discuss during next encounter  Interventions:  . Provided education to patient re: diabetes and diabetic diet . Discussed importance of medication adherence (unable to review meds as patient is not at home) . Discussed plans with patient for ongoing care management follow up and provided patient with direct contact information for care management team . Provided patient with written educational materials related to hypo and hyperglycemia and importance of correct treatment . Advised patient, providing  education and rationale, to check cbg three times a week and record, calling Maurice Small, FNP for findings outside established parameters.    Patient Self Care Activities:  . Self administers medications as prescribed . Attends all scheduled  provider appointments . Checks blood sugars as prescribed and utilize hyper and hypoglycemia protocol as needed . Adhere to ADA diet as discussed  Plan:  . RNCM will follow up with patient in 1 week   Initial goal documentation         Follow up plan:  Telephone follow up appointment with CCM team member scheduled for: 1 week  Evan Townsend was given information about Care Management services today including:  1. Case Management services include personalized support from designated clinical staff supervised by a physician, including individualized plan of care and coordination with other care providers 2. 24/7 contact phone numbers for assistance for urgent and routine care needs. 3. The patient may stop CCM services at any time (effective at the end of the month) by phone call to the office staff.  Patient agreed to services and verbal consent obtained.   Evan Garduno E. Suzie Portela, RN, BSN Nurse Care Coordinator [facility] Care Management  [phone]

## 2018-12-06 NOTE — Chronic Care Management (AMB) (Signed)
Care Management    Clinical Social Work General Note  12/06/2018 Name: Evan BETHELL Sr. MRN: [id number] DOB: 1956/02/01  Evan Ano Sr. is a 63 y.o. year old male who is a primary care patient of Doren Custard, FNP. The CCM was consulted to assist the patient with home care needs, social needs and medication review.   Patient agreed to services and verbal consent obtained by Yvone Neu on 11/30/2018.  Review of patient status, including review of consultants reports, relevant laboratory and other test results, and collaboration with appropriate care team members and the patient's provider was performed as part of comprehensive patient evaluation and provision of chronic care management services.    SDOH (Social Determinants of Health) screening performed today. See Care Plan Entry related to challenges with: Dental follow up. Per patient, his main need is identifying low cost dental care. CCM RNCM has already discussed low cost dental care with patient and his spouse. Per patient and his wife, they both use [employer] for transportation to medical appointments. They also receive food stamps and report having access to food banks as well. Patient currently receives SSI and Medicaid. Patient discussed having a supportive relationship with his children and  church family.  Goals Addressed   None      Follow Up Plan: SW will follow up with patient by phone over the next 2 weeks to follow up on any additional community resource needs       Verna Czech, LCSW Clinical Social Worker  [facility] Care Management [phone]

## 2018-12-06 NOTE — Patient Instructions (Addendum)
Thank you allowing the Chronic Care Management Team to be a part of your care! It was a pleasure speaking with you today!  1. Please continue to take your medications daily as directed. 2. I have [facility] the [facility] and they should be giving you a call. If you do not hear from them please call [phone] 3. Please read the information provided to you about diabetes diet. I would like for you to have better fasting blood sugars numbers to reduce your risk of another stroke. 4. Enroll in out patient physical therapy as soon as the COVID-19 restrictions are lifted. 5. Please continue to follow the COVID-19 infection prevention stratagies as you are at a high risk for developing complications.  Coronavirus (COVID-19) Are you at risk?  Are you at risk for the Coronavirus (COVID-19)?  To be considered HIGH RISK for Coronavirus (COVID-19), you have to meet the following criteria:  . Traveled to [place], [place], [place], [place] or [place]; or in the [place] to [place], [place], [place], or [place]; and have fever, cough, and shortness of breath within the last 2 weeks of travel OR . Been in close contact with a person diagnosed with COVID-19 within the last 2 weeks and have fever, cough, and shortness of breath . IF YOU DO NOT MEET THESE CRITERIA, YOU ARE CONSIDERED LOW RISK FOR COVID-19.  What to do if you are HIGH RISK for COVID-19?  Marland Kitchen. If you are having a medical emergency, call 911. . Seek medical care right away. Before you go to a doctor's office, urgent care or emergency department, call ahead and tell them about your recent travel, contact with someone diagnosed with COVID-19, and your symptoms. You should receive instructions from your physician's office regarding next steps of care.  . When you arrive at healthcare provider, tell the healthcare staff immediately you have returned from visiting [place], [place], [place], [place] or [place]; or traveled in the  [place] to [place], [place], [place], or [place]; in the last two weeks or you have been in close contact with a person diagnosed with COVID-19 in the last 2 weeks.   . Tell the health care staff about your symptoms: fever, cough and shortness of breath. . After you have been seen by a medical provider, you will be either: o Tested for (COVID-19) and discharged home on quarantine except to seek medical care if symptoms worsen, and asked to  - Stay home and avoid contact with others until you get your results (4-5 days)  - Avoid travel on public transportation if possible (such as bus, train, or airplane) or o Sent to the Emergency Department by EMS for evaluation, COVID-19 testing, and possible admission depending on your condition and test results.  What to do if you are LOW RISK for COVID-19?  Reduce your risk of any infection by using the same precautions used for avoiding the common cold or flu:  Marland Kitchen. Wash your hands often with soap and warm water for at least 20 seconds.  If soap and water are not readily available, use an alcohol-based hand sanitizer with at least 60% alcohol.  . If coughing or sneezing, cover your mouth and nose by coughing or sneezing into the elbow areas of your shirt or coat, into a tissue or into your sleeve (not your hands). . Avoid shaking hands with others and consider head nods or verbal greetings only. . Avoid touching your eyes, nose, or mouth with  unwashed hands.  . Avoid close contact with people who are sick. . Avoid places or events with large numbers of people in one location, like concerts or sporting events. . Carefully consider travel plans you have or are making. . If you are planning any travel outside or inside the [place], visit the CDC's Travelers' Health webpage for the latest health notices. . If you have some symptoms but not all symptoms, continue to monitor at home and seek medical attention if your symptoms worsen. . If you are having  a medical emergency, call 911.  CCM (Chronic Care Management) Team   Yvone Neu RN, BSN Nurse Care Coordinator  [phone]  Karalee Height PharmD  Clinical Pharmacist  [phone]   Verna Czech, LCSW Clinical Social Worker [phone]  Goals Addressed            This Visit's Progress   . I rellly need to see a dentist ASAP (pt-stated)       Current Barriers:  Marland Kitchen Knowledge Deficits related to low cost dental clinics . Corporate treasurer.   Nurse Case Manager Clinical Goal(s):  Marland Kitchen Over the next 14 days, patient will verbalize understanding of plan for obtaining affordible dental care . Over the next 14 days, patient will work with [employer] (community agency) to recieve full dental care specifically addressing his tooth decay and pain  Interventions:   Assessed patients dental complaints . Provided education to patient re: importance of dental health and effects of poor dental health/complications on chronic conditions . Collaborated with [facility] regarding needed appointment . Provided patient and/or caregiver with contact information  information about [facility] [phone].  Patient Self Care Activities:  . Attends all scheduled provider appointments . Calls provider office for new concerns or questions  . Engage with [facility] o [address] [place], [place] [address] o [phone]  Initial goal documentation      . My blood sugars are in the 180s-thats real good isn't it? (pt-stated)       Current Barriers:  Marland Kitchen Knowledge Deficits related to basic Diabetes pathophysiology and self care/management . Knowledge Deficit related to DM as it relates to increased risk of stroke  Nurse Case Manager Clinical Goal(s):  Marland Kitchen Over the next 14 days, patient will demonstrate improved adherence to prescribed treatment plan for diabetes self care/management as evidenced by checking blood glucose levels  as prescribed, adhering to ADA/low carb diet, daily exercise as tolerated, and medication adherence. . Over the next 14 days, patient will review stroke risk information and be prepared to discuss during next encounter  Interventions:  . Provided education to patient re: diabetes and diabetic diet . Discussed importance of medication adherence (unable to review meds as patient is not at home) . Discussed plans with patient for ongoing care management follow up and provided patient with direct contact information for care management team . Provided patient with written educational materials related to hypo and hyperglycemia and importance of correct treatment . Advised patient, providing education and rationale, to check cbg three times a week and record, calling Maurice Small, FNP for findings outside established parameters.    Patient Self Care Activities:  . Self administers medications as prescribed . Attends all scheduled provider appointments . Checks blood sugars as prescribed and utilize hyper and hypoglycemia protocol as needed . Adhere to ADA diet as discussed  Plan:  . RNCM will follow up with patient in 1 week   Initial  goal documentation        Print copy of patient instructions provided.   Telephone follow up appointment with CCM team member scheduled for:1 week     Stroke Prevention Some medical conditions and lifestyle choices can lead to a higher risk for a stroke. You can help to prevent a stroke by making nutrition, lifestyle, and other changes. What nutrition changes can be made?  Eat healthy foods. Choose foods that are high in fiber. These include: Fresh fruits. Fresh vegetables. Whole grains. Eat at least 5 or more servings of fruits and vegetables each day. Try to fill half of your plate at each meal with fruits and vegetables. Choose lean protein foods. These include: Lowfat (lean) cuts of meat. Chicken without skin. Fish. Tofu. Beans. Nuts. Eat  low-fat dairy products. Avoid foods that: Are high in salt (sodium). Have saturated fat. Have trans fat. Have cholesterol. Are processed. Are premade. Follow eating guidelines as told by your doctor. These may include: Reducing how many calories you eat and drink each day. Limiting how much salt you eat or drink each day to 1,500 milligrams (mg). Using only healthy fats for cooking. These include: Olive oil. Canola oil. Sunflower oil. Counting how many carbohydrates you eat and drink each day. What lifestyle changes can be made? Try to stay at a healthy weight. Talk to your doctor about what a good weight is for you. Get at least 30 minutes of moderate physical activity at least 5 days a week. This can include: Fast walking. Biking. Swimming. Do not use any products that have nicotine or tobacco. This includes cigarettes and e-cigarettes. If you need help quitting, ask your doctor. Avoid being around tobacco smoke in general. Limit how much alcohol you drink to no more than 1 drink a day for nonpregnant women and 2 drinks a day for men. One drink equals 12 oz of beer, 5 oz of wine, or 1 oz of hard liquor. Do not use drugs. Avoid taking birth control pills. Talk to your doctor about the risks of taking birth control pills if: You are over 37 years old. You smoke. You get migraines. You have had a blood clot. What other changes can be made? Manage your cholesterol. It is important to eat a healthy diet. If your cholesterol cannot be managed through your diet, you may also need to take medicines. Take medicines as told by your doctor. Manage your diabetes. It is important to eat a healthy diet and to exercise regularly. If your blood sugar cannot be managed through diet and exercise, you may need to take medicines. Take medicines as told by your doctor. Control your high blood pressure (hypertension). Try to keep your blood pressure below 130/80. This can help lower your risk of  stroke. It is important to eat a healthy diet and to exercise regularly. If your blood pressure cannot be managed through diet and exercise, you may need to take medicines. Take medicines as told by your doctor. Ask your doctor if you should check your blood pressure at home. Have your blood pressure checked every year. Do this even if your blood pressure is normal. Talk to your doctor about getting checked for a sleep disorder. Signs of this can include: Snoring a lot. Feeling very tired. Take over-the-counter and prescription medicines only as told by your doctor. These may include aspirin or blood thinners (antiplatelets or anticoagulants). Make sure that any other medical conditions you have are managed. Where to find more information American  Stroke Association: www.strokeassociation.org National Stroke Association: www.stroke.org Get help right away if: You have any symptoms of stroke. "BE FAST" is an easy way to remember the main warning signs: B - Balance. Signs are dizziness, sudden trouble walking, or loss of balance. E - Eyes. Signs are trouble seeing or a sudden change in how you see. F - Face. Signs are sudden weakness or loss of feeling of the face, or the face or eyelid drooping on one side. A - Arms. Signs are weakness or loss of feeling in an arm. This happens suddenly and usually on one side of the body. S - Speech. Signs are sudden trouble speaking, slurred speech, or trouble understanding what people say. T - Time. Time to call emergency services. Write down what time symptoms started. You have other signs of stroke, such as: A sudden, very bad headache with no known cause. Feeling sick to your stomach (nausea). Throwing up (vomiting). Jerky movements you cannot control (seizure). These symptoms may represent a serious problem that is an emergency. Do not wait to see if the symptoms will go away. Get medical help right away. Call your local emergency services (911 in the  [place].S.). Do not drive yourself to the hospital. Summary You can prevent a stroke by eating healthy, exercising, not smoking, drinking less alcohol, and treating other health problems, such as diabetes, high blood pressure, or high cholesterol. Do not use any products that contain nicotine or tobacco, such as cigarettes and e-cigarettes. Get help right away if you have any signs or symptoms of a stroke.   What increases the risk? Certain things may make you more likely to have a stroke. Some of these are things that you can change, such as:  Being very overweight (obesity).  Smoking.  Taking birth control pills.  Not being active.  Drinking too much alcohol.  Using drugs. Other risk factors include:  High blood pressure.  High cholesterol.  Diabetes.  Heart disease.  Being [place] American, Native [address], Hispanic, or [place] Native.  Being over age 26.  Family history of stroke.  Having had blood clots, stroke, or warning stroke (transient ischemic attack, TIA) in the past.  Sickle cell disease.  Being a woman with a history of high blood pressure in pregnancy (preeclampsia).  Migraine headache.  Sleep apnea.  Having an irregular heartbeat (atrial fibrillation).  Long-term (chronic) diseases that cause soreness and swelling (inflammation).  Disorders that affect how your blood clots.

## 2018-12-06 NOTE — Chronic Care Management (AMB) (Signed)
  Chronic Care Management   Note  12/06/2018 Name: Evan Townsend Sr. MRN: [id number] DOB: 06-Apr-1956  63 y.o. year old male referred to Chronic Care Management by Maurice Small, FNP for evaluation of home care, social needs, and medication review. 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 Last office visit with Doren Custard, FNP was 11/25/2018.   Was unable to reach patient via telephone today and have left HIPAA compliant voicemail asking patient to return my call. (unsuccessful outreach #1).  Karalee Height, PharmD Clinical Pharmacist [facility] [phone]

## 2018-12-07 DIAGNOSIS — E1165 Type 2 diabetes mellitus with hyperglycemia: Secondary | ICD-10-CM | POA: Diagnosis not present

## 2018-12-08 ENCOUNTER — Other Ambulatory Visit: Payer: Self-pay

## 2018-12-08 ENCOUNTER — Ambulatory Visit: Payer: Self-pay | Admitting: Pharmacist

## 2018-12-08 DIAGNOSIS — Z794 Long term (current) use of insulin: Secondary | ICD-10-CM

## 2018-12-08 DIAGNOSIS — Z720 Tobacco use: Secondary | ICD-10-CM

## 2018-12-08 DIAGNOSIS — E1165 Type 2 diabetes mellitus with hyperglycemia: Secondary | ICD-10-CM | POA: Diagnosis not present

## 2018-12-08 DIAGNOSIS — E1159 Type 2 diabetes mellitus with other circulatory complications: Secondary | ICD-10-CM

## 2018-12-08 DIAGNOSIS — I10 Essential (primary) hypertension: Secondary | ICD-10-CM

## 2018-12-08 NOTE — Chronic Care Management (AMB) (Signed)
  Care Management   Note  12/08/2018 Name: Evan BURKART Sr. MRN: [id number] DOB: 1955-08-30  Successful outreach to patient's wife Evan Townsend (DPR on file) for initial pharmacy consult. HIPAA identifiers verified.  Evan Townsend thanks pharmacist for the call but states that patient has no pharmacy needs, no questions or issues with medications, and all medications are affordable via Medicaid. Evan Townsend declined clinical pharmacy services.  Follow Up: Telephone follow up appointment with CCM team member scheduled for: April 29 with LCSW The patient has been provided with contact information for the chronic care management team and has been advised to call with any health related questions or concerns.   Karalee Height, PharmD Clinical Pharmacist [facility] [phone]

## 2018-12-08 NOTE — Patient Instructions (Signed)
Please call a member of the CCM (Chronic Care Management) Team with any questions or case management needs:   Portia Payne, Rn, BSN Nurse Care Coordinator  [phone]  Leaner Morici, PharmD  Clinical Pharmacist  [phone]  Chrystal Land, LCSW Clinical Social Worker [phone]  

## 2018-12-09 DIAGNOSIS — E1165 Type 2 diabetes mellitus with hyperglycemia: Secondary | ICD-10-CM | POA: Diagnosis not present

## 2018-12-10 DIAGNOSIS — E1165 Type 2 diabetes mellitus with hyperglycemia: Secondary | ICD-10-CM | POA: Diagnosis not present

## 2018-12-11 DIAGNOSIS — E1165 Type 2 diabetes mellitus with hyperglycemia: Secondary | ICD-10-CM | POA: Diagnosis not present

## 2018-12-12 ENCOUNTER — Ambulatory Visit: Payer: Medicaid Other | Admitting: Family Medicine

## 2018-12-12 ENCOUNTER — Encounter

## 2018-12-12 DIAGNOSIS — E1165 Type 2 diabetes mellitus with hyperglycemia: Secondary | ICD-10-CM | POA: Diagnosis not present

## 2018-12-13 DIAGNOSIS — E1165 Type 2 diabetes mellitus with hyperglycemia: Secondary | ICD-10-CM | POA: Diagnosis not present

## 2018-12-14 ENCOUNTER — Ambulatory Visit: Payer: Self-pay | Admitting: *Deleted

## 2018-12-14 DIAGNOSIS — Z794 Long term (current) use of insulin: Secondary | ICD-10-CM

## 2018-12-14 DIAGNOSIS — E1165 Type 2 diabetes mellitus with hyperglycemia: Secondary | ICD-10-CM | POA: Diagnosis not present

## 2018-12-14 DIAGNOSIS — E1159 Type 2 diabetes mellitus with other circulatory complications: Secondary | ICD-10-CM

## 2018-12-14 DIAGNOSIS — K089 Disorder of teeth and supporting structures, unspecified: Secondary | ICD-10-CM

## 2018-12-14 NOTE — Patient Instructions (Signed)
Thank you allowing the Chronic Care Management Team to be a part of your care! It was a pleasure speaking with you today!  1. Please follow up with the [facility] as needed. 2. Please contact this CCM social worker with any additional questions or concerns related to community resource needs in the future.     CCM (Chronic Care Management) Team   Yvone Neu RN, BSN Nurse Care Coordinator  [phone]  Karalee Height PharmD  Clinical Pharmacist  [phone]   Sahalie Beth [address], LCSW Clinical Social Worker [phone]  Goals Addressed   None      The patient verbalized understanding of instructions provided today and declined a print copy of patient instruction materials.   No further follow up required: [employer] needs addressed

## 2018-12-14 NOTE — Chronic Care Management (AMB) (Signed)
  Chronic Care Management   Social Work Note  12/14/2018 Name: Evan Townsend Sr. MRN: [id number] DOB: [birth year]  Evan Ano Sr. is a 63 y.o. year old male who sees Doren Custard, FNP for primary care. The CCM team was consulted for assist the patient with home care needs, social needs and medication review.  During previous conversation with patient and his wife, both discussed the main challenge for patient is dental care. This social worker was able to confirm that patient did have his virtual visit with on 12/13/18 with the [facility] and they are now waiting on a phone call from them to schedule the initial face to face.  Both patient and his wife understand that due to COVID-19, patient will have to present to the clinic alone. Patient's wife can remain available by phone if they have additonal questions that patient may not be able to answer. Patient and spouse verbalized having no additional community resource needs at this time.  Patient and spouse encouraged to call this social worker if there are any community resource needs in the future.  Goals Addressed   None     Follow Up Plan: Client will follow up with the [facility] as need,  Client reminded of appointment with Helane Gunther for diabetic foot exam on 12/15/18 at 9:00am   Patient to contact this social worker with any future questions or concerns.   Evan Czech, LCSW Clinical Social Ecologist [facility] Care Management [phone]

## 2018-12-15 ENCOUNTER — Ambulatory Visit: Payer: Self-pay

## 2018-12-15 ENCOUNTER — Other Ambulatory Visit: Payer: Self-pay

## 2018-12-15 ENCOUNTER — Encounter: Payer: Self-pay | Admitting: Podiatry

## 2018-12-15 ENCOUNTER — Ambulatory Visit (INDEPENDENT_AMBULATORY_CARE_PROVIDER_SITE_OTHER): Payer: Medicaid Other | Admitting: Podiatry

## 2018-12-15 VITALS — Temp 97.4°F

## 2018-12-15 DIAGNOSIS — E1142 Type 2 diabetes mellitus with diabetic polyneuropathy: Secondary | ICD-10-CM

## 2018-12-15 DIAGNOSIS — M79675 Pain in left toe(s): Secondary | ICD-10-CM | POA: Diagnosis not present

## 2018-12-15 DIAGNOSIS — M79674 Pain in right toe(s): Secondary | ICD-10-CM

## 2018-12-15 DIAGNOSIS — I10 Essential (primary) hypertension: Secondary | ICD-10-CM

## 2018-12-15 DIAGNOSIS — E1165 Type 2 diabetes mellitus with hyperglycemia: Secondary | ICD-10-CM | POA: Diagnosis not present

## 2018-12-15 DIAGNOSIS — Z794 Long term (current) use of insulin: Secondary | ICD-10-CM

## 2018-12-15 DIAGNOSIS — K089 Disorder of teeth and supporting structures, unspecified: Secondary | ICD-10-CM

## 2018-12-15 DIAGNOSIS — E1159 Type 2 diabetes mellitus with other circulatory complications: Secondary | ICD-10-CM

## 2018-12-15 DIAGNOSIS — B351 Tinea unguium: Secondary | ICD-10-CM

## 2018-12-15 NOTE — Chronic Care Management (AMB) (Signed)
Care Management   Follow Up Note   12/15/2018 Name: Evan BERRONES Sr. MRN: [id number] DOB: 11-Aug-1956  Referred by: Doren Custard, FNP Reason for referral : Chronic Care Management (follow up dental clinic/DM)    Subjective: "I am doing good with my health. All I really needed from you was to get to a dentist"    Objective:  Lab Results  Component Value Date   HGBA1C 10.3 (H) 02/10/2018   BP Readings from Last 3 Encounters:  11/25/18 130/80  09/23/18 (!) 145/79  09/06/18 124/63   Lab Results  Component Value Date   CREATININE 1.39 (H) 09/20/2018   BUN 37 (H) 09/07/2018   NA 140 09/07/2018   K 4.4 09/07/2018   CL 112 (H) 09/07/2018   CO2 21 (L) 09/07/2018     Assessment: Evan Townsend a 63year old malewho seesEmily Annye Townsend, FNPfor primary care. Ms. Evan Townsend the CCM team to consult the patient forcare coordination and chronic case management related to DM, HTN, TIA, social and financial barriers to care. Patient has a history of but not limited toTobacco abuse (smoke free for 5 months), HTN, TIA, DM and Hyperlipidemia.Referral was placed4/05/2019 during last office visit. Health assessment and goal setting was completed on 12/06/2018. Today CCM RN Townsend followed up with patient to assess progression towards goals and to discuss any additional health needs patient may have.  Review of patient status, including review of consultants reports, relevant laboratory and other test results, and collaboration with appropriate care team members and the patient'Townsend provider was performed as part of comprehensive patient evaluation and provision of chronic care management services.    Goals Addressed            This Visit'Townsend Progress   . COMPLETED: I rellly need to see a dentist ASAP (pt-stated)       Per patient wife, Evan Townsend in engaged with [facility]. He completed his initial assessment 12/13/2018 and is awaiting a virtual visit to schedule an  appointment. As of date, this appointment has not been set, however [name] will call this afternoon to follow up. Evan Townsend is very appreciative of assistance with locating a dentist as this is his greatest health need at this time.   Current Barriers:  Marland Kitchen Knowledge Deficits related to low cost dental clinics . Corporate treasurer.   Nurse Case Manager Clinical Goal(Townsend):  Marland Kitchen Over the next 14 days, patient will verbalize understanding of plan for obtaining affordible dental care . Over the next 14 days, patient will work with [employer] (community agency) to recieve full dental care specifically addressing his tooth decay and pain  Interventions:   Assessed patients dental complaints . Provided education to patient re: importance of dental health and effects of poor dental health/complications on chronic conditions . Collaborated with [facility] regarding needed appointment . Provided patient and/or caregiver with contact information  information about Evan Townsend [phone].  Patient Self Care Activities:  . Attends all scheduled provider appointments . Calls provider office for new concerns or questions  . Engage with Evan Townsend o [address] [place], [place] [address] o [phone]  Initial goal documentation      . COMPLETED: My blood sugars are in the 180s-thats real good isn't it? (pt-stated)       [name] is very appreciative of the education provided by Evan Townsend. She is patients primary caregiver including preparing meals, assisting with medication administration, and  checking his CBG. Reports fasting blood sugars are now less than 150s with diet modification. Unfortunately Evan Townsend is unable to do cardiovascular exercise secondary to limitation caused by his stroke. He is scheduled to begin PT once Covid-19 precautions are lifted. Mr. And Mr Raul Townsend deny need for additional education and management of Mr.  Townsend other chronic conditions including CKD and HTN. Information has been provided to patient during previous hospitalizations and MD office visits.  Current Barriers:  Marland Kitchen. Knowledge Deficits related to basic Diabetes pathophysiology and self care/management . Knowledge Deficit related to DM as it relates to increased risk of stroke  Nurse Case Manager Clinical Goal(Townsend):  Marland Kitchen. Over the next 14 days, patient will demonstrate improved adherence to prescribed treatment plan for diabetes self care/management as evidenced by checking blood glucose levels as prescribed, adhering to ADA/low carb diet, daily exercise as tolerated, and medication adherence. . Over the next 14 days, patient will review stroke risk information and be prepared to discuss during next encounter  Interventions:  . Provided education to patient re: diabetes and diabetic diet . Discussed importance of medication adherence (unable to review meds as patient is not at home) . Discussed plans with patient for ongoing care management follow up and provided patient with direct contact information for care management team . Provided patient with written educational materials related to hypo and hyperglycemia and importance of correct treatment . Advised patient, providing education and rationale, to check cbg three times a week and record, calling Evan SmallEmily Boyce, FNP for findings outside established parameters.    Patient Self Care Activities:  . Self administers medications as prescribed . Attends all scheduled provider appointments . Checks blood sugars as prescribed and utilize hyper and hypoglycemia protocol as needed . Adhere to ADA diet as discussed  Plan:  No follow up required at this time as patient states he has all needed education and resources to care for his diabetes and hypertension            Follow Up Plan: No follow up plan at this time. CCM RN Townsend will remain available if patient needs arise. Patient and wife  have been provided CCM RN Townsend contact information and encouraged to call if needed.   Evan Whinery E. Suzie PortelaPayne, RN, BSN Nurse Care Coordinator [facility] Care Management  [phone]

## 2018-12-15 NOTE — Patient Instructions (Signed)
Thank you allowing the Chronic Care Management Team to be a part of your care! It was a pleasure speaking with you today!  1. Continue to follow your plan of care to control your diabetes, chronic kidney disease, and high blood pressure. 2. Take all your medications as prescribed 3. Attend all medical appointments  4. Please follow CDC Covid-19 infection prevention strategies as you are very high risk for complications. Handwashing and social distancing remain THE BEST WAY to prevent the spread of the virus. 5. Please continue to remain engaged with the [facility]. They can be reached at [phone] 6. Please contact the CCM Team if additional needs arise. We will be happy to assist!  CCM (Chronic Care Management) Team   Trish Fountain RN, BSN Nurse Care Coordinator  [phone]  Ruben Reason PharmD  Clinical Pharmacist  [phone]   Elliot Gurney, LCSW Clinical Social Worker [phone]  Goals Addressed            This Visit's Progress   . COMPLETED: I rellly need to see a dentist ASAP (pt-stated)       Current Barriers:  Marland Kitchen Knowledge Deficits related to low cost dental clinics . Film/video editor.   Nurse Case Manager Clinical Goal(s):  Marland Kitchen Over the next 14 days, patient will verbalize understanding of plan for obtaining affordible dental care . Over the next 14 days, patient will work with [employer] (community agency) to recieve full dental care specifically addressing his tooth decay and pain  Interventions:   Assessed patients dental complaints . Provided education to patient re: importance of dental health and effects of poor dental health/complications on chronic conditions . Collaborated with [facility] regarding needed appointment . Provided patient and/or caregiver with contact information  information about [facility] [phone].  Patient Self Care Activities:  . Attends all scheduled  provider appointments . Calls provider office for new concerns or questions  . Engage with [facility] [address], [place] [address] o [phone]  Initial goal documentation      . COMPLETED: My blood sugars are in the 180s-thats real good isn't it? (pt-stated)       Current Barriers:  Marland Kitchen Knowledge Deficits related to basic Diabetes pathophysiology and self care/management . Knowledge Deficit related to DM as it relates to increased risk of stroke  Nurse Case Manager Clinical Goal(s):  Marland Kitchen Over the next 14 days, patient will demonstrate improved adherence to prescribed treatment plan for diabetes self care/management as evidenced by checking blood glucose levels as prescribed, adhering to ADA/low carb diet, daily exercise as tolerated, and medication adherence. . Over the next 14 days, patient will review stroke risk information and be prepared to discuss during next encounter  Interventions:  . Provided education to patient re: diabetes and diabetic diet . Discussed importance of medication adherence (unable to review meds as patient is not at home) . Discussed plans with patient for ongoing care management follow up and provided patient with direct contact information for care management team . Provided patient with written educational materials related to hypo and hyperglycemia and importance of correct treatment . Advised patient, providing education and rationale, to check cbg three times a week and record, calling Raelyn Ensign, FNP for findings outside established parameters.    Patient Self Care Activities:  . Self administers medications as prescribed . Attends all scheduled provider appointments . Checks blood sugars as prescribed and utilize hyper and hypoglycemia protocol  as needed . Adhere to ADA diet as discussed  Plan:  No follow up required at this time as patient states he has all needed education and resources to care for his diabetes and  hypertension           The patient verbalized understanding of instructions provided today and declined a print copy of patient instruction materials.   No further follow up required: patient declined continued engagement as he feels his needs are met

## 2018-12-15 NOTE — Progress Notes (Signed)
This patient presents to the office with chief complaint of long thick nails and diabetic feet.  This patient  says there  is  no pain and discomfort in his  feet.  This patient says there are long thick painful nails.  These nails are painful walking and wearing shoes.  Patient has no history of infection or drainage from both feet.  Patient is unable to  self treat his own nails . This patient presents  to the office today for treatment of the  long nails and a foot evaluation due to history of  Diabetes. Patient has his CVA which leads muscle weakness left fide.  He walks with a brace.  General Appearance  Alert, conversant and in no acute stress.  Vascular  Dorsalis pedis and posterior tibial  pulses are palpable  bilaterally.  Capillary return is within normal limits  bilaterally. Temperature is within normal limits  bilaterally.  Neurologic  Senn-Weinstein monofilament wire test within normal limits  bilaterally. Muscle power within normal limits bilaterally.  Nails Thick disfigured discolored nails with subungual debris  from hallux to fifth toes bilaterally. No evidence of bacterial infection or drainage bilaterally.  Orthopedic  No limitations of motion of motion feet .  No crepitus or effusions noted.  Hallux limitus  B/L.  Skin  normotropic skin with no porokeratosis noted bilaterally.  No signs of infections or ulcers noted.   Scar tissue 1st MPJ  Left foot.  Onychomycosis  Diabetes with no foot complications  IE  Debride nails x 10.  A diabetic foot exam was performed and there is no evidence of any vascular or neurologic pathology.   RTC 3 months.  Patient to talk to [facility] about bracing and diabetic shoes.   Helane Gunther DPM

## 2018-12-16 DIAGNOSIS — E1165 Type 2 diabetes mellitus with hyperglycemia: Secondary | ICD-10-CM | POA: Diagnosis not present

## 2018-12-17 DIAGNOSIS — E1165 Type 2 diabetes mellitus with hyperglycemia: Secondary | ICD-10-CM | POA: Diagnosis not present

## 2018-12-18 DIAGNOSIS — E1165 Type 2 diabetes mellitus with hyperglycemia: Secondary | ICD-10-CM | POA: Diagnosis not present

## 2018-12-19 DIAGNOSIS — E1165 Type 2 diabetes mellitus with hyperglycemia: Secondary | ICD-10-CM | POA: Diagnosis not present

## 2018-12-20 DIAGNOSIS — E1165 Type 2 diabetes mellitus with hyperglycemia: Secondary | ICD-10-CM | POA: Diagnosis not present

## 2018-12-21 DIAGNOSIS — E1165 Type 2 diabetes mellitus with hyperglycemia: Secondary | ICD-10-CM | POA: Diagnosis not present

## 2018-12-21 DIAGNOSIS — I639 Cerebral infarction, unspecified: Secondary | ICD-10-CM | POA: Diagnosis not present

## 2018-12-22 DIAGNOSIS — E1165 Type 2 diabetes mellitus with hyperglycemia: Secondary | ICD-10-CM | POA: Diagnosis not present

## 2018-12-23 DIAGNOSIS — E1165 Type 2 diabetes mellitus with hyperglycemia: Secondary | ICD-10-CM | POA: Diagnosis not present

## 2018-12-24 DIAGNOSIS — E1165 Type 2 diabetes mellitus with hyperglycemia: Secondary | ICD-10-CM | POA: Diagnosis not present

## 2018-12-25 DIAGNOSIS — E1165 Type 2 diabetes mellitus with hyperglycemia: Secondary | ICD-10-CM | POA: Diagnosis not present

## 2018-12-26 DIAGNOSIS — E1165 Type 2 diabetes mellitus with hyperglycemia: Secondary | ICD-10-CM | POA: Diagnosis not present

## 2018-12-27 DIAGNOSIS — E1165 Type 2 diabetes mellitus with hyperglycemia: Secondary | ICD-10-CM | POA: Diagnosis not present

## 2018-12-28 DIAGNOSIS — E1165 Type 2 diabetes mellitus with hyperglycemia: Secondary | ICD-10-CM | POA: Diagnosis not present

## 2018-12-29 DIAGNOSIS — E1165 Type 2 diabetes mellitus with hyperglycemia: Secondary | ICD-10-CM | POA: Diagnosis not present

## 2018-12-30 DIAGNOSIS — E1165 Type 2 diabetes mellitus with hyperglycemia: Secondary | ICD-10-CM | POA: Diagnosis not present

## 2018-12-31 DIAGNOSIS — E1165 Type 2 diabetes mellitus with hyperglycemia: Secondary | ICD-10-CM | POA: Diagnosis not present

## 2019-01-01 DIAGNOSIS — E1165 Type 2 diabetes mellitus with hyperglycemia: Secondary | ICD-10-CM | POA: Diagnosis not present

## 2019-01-02 DIAGNOSIS — E1165 Type 2 diabetes mellitus with hyperglycemia: Secondary | ICD-10-CM | POA: Diagnosis not present

## 2019-01-03 DIAGNOSIS — E1165 Type 2 diabetes mellitus with hyperglycemia: Secondary | ICD-10-CM | POA: Diagnosis not present

## 2019-01-04 DIAGNOSIS — E1165 Type 2 diabetes mellitus with hyperglycemia: Secondary | ICD-10-CM | POA: Diagnosis not present

## 2019-01-05 DIAGNOSIS — E1165 Type 2 diabetes mellitus with hyperglycemia: Secondary | ICD-10-CM | POA: Diagnosis not present

## 2019-01-06 DIAGNOSIS — E1165 Type 2 diabetes mellitus with hyperglycemia: Secondary | ICD-10-CM | POA: Diagnosis not present

## 2019-01-07 DIAGNOSIS — E1165 Type 2 diabetes mellitus with hyperglycemia: Secondary | ICD-10-CM | POA: Diagnosis not present

## 2019-01-08 DIAGNOSIS — E1165 Type 2 diabetes mellitus with hyperglycemia: Secondary | ICD-10-CM | POA: Diagnosis not present

## 2019-01-09 DIAGNOSIS — E1165 Type 2 diabetes mellitus with hyperglycemia: Secondary | ICD-10-CM | POA: Diagnosis not present

## 2019-01-10 ENCOUNTER — Other Ambulatory Visit: Payer: Self-pay | Admitting: Family Medicine

## 2019-01-10 DIAGNOSIS — E1165 Type 2 diabetes mellitus with hyperglycemia: Secondary | ICD-10-CM | POA: Diagnosis not present

## 2019-01-11 DIAGNOSIS — E1165 Type 2 diabetes mellitus with hyperglycemia: Secondary | ICD-10-CM | POA: Diagnosis not present

## 2019-01-12 DIAGNOSIS — E1165 Type 2 diabetes mellitus with hyperglycemia: Secondary | ICD-10-CM | POA: Diagnosis not present

## 2019-01-13 ENCOUNTER — Ambulatory Visit: Payer: Self-pay | Admitting: *Deleted

## 2019-01-13 ENCOUNTER — Other Ambulatory Visit: Payer: Self-pay | Admitting: *Deleted

## 2019-01-13 DIAGNOSIS — Z794 Long term (current) use of insulin: Secondary | ICD-10-CM

## 2019-01-13 DIAGNOSIS — E1165 Type 2 diabetes mellitus with hyperglycemia: Secondary | ICD-10-CM | POA: Diagnosis not present

## 2019-01-13 DIAGNOSIS — K089 Disorder of teeth and supporting structures, unspecified: Secondary | ICD-10-CM

## 2019-01-13 DIAGNOSIS — E1159 Type 2 diabetes mellitus with other circulatory complications: Secondary | ICD-10-CM

## 2019-01-13 NOTE — Chronic Care Management (AMB) (Signed)
  Chronic Care Management   Social Work Note  01/13/2019 Name: MANIK WESTGATE Sr. MRN: [id number] DOB: [birth year]  Sandie Ano Sr. is a 63 y.o. year old male who sees Doren Custard, FNP for primary care. The CCM team was consulted for assistance with [facility] for Dental Care.   Phone call from patient and his wife stating that they went to the [facility] approximately 3 weeks ago and had 3 teeth pulled. Patient calling today to obtain information on the status of his dentures. This Child psychotherapist gave patient and his wife the phone number to the [facility] to call for follow up regarding next steps related to his dental care. [phone].   Per patient's wife, patient has a eye appointment scheduled for 01/26/2019 at 2:15pm. Goals Addressed   None     Follow Up Plan: Client will contact the [facility] to discuss next steps related to his dental care.  Verna Czech, LCSW Clinical Social Ecologist [facility] Care Management [phone]

## 2019-01-13 NOTE — Patient Instructions (Signed)
Thank you allowing the Chronic Care Management Team to be a part of your care! It was a pleasure speaking with you today!  1. Please contact the [facility] [phone] for next steps regarding your dental care 2. Please call this social worker if there are any additional questions regarding your community resource needs.       CCM (Chronic Care Management) Team   Yvone Neu RN, BSN Nurse Care Coordinator  [phone]  Karalee Height PharmD  Clinical Pharmacist  [phone]   Alayssa Flinchum [address], LCSW Clinical Social Worker [phone]  Goals Addressed   None      The patient verbalized understanding of instructions provided today and declined a print copy of patient instruction materials.   No further follow up required: please contact the [facility]

## 2019-01-14 DIAGNOSIS — E1165 Type 2 diabetes mellitus with hyperglycemia: Secondary | ICD-10-CM | POA: Diagnosis not present

## 2019-01-16 DIAGNOSIS — E1165 Type 2 diabetes mellitus with hyperglycemia: Secondary | ICD-10-CM | POA: Diagnosis not present

## 2019-01-16 DIAGNOSIS — I639 Cerebral infarction, unspecified: Secondary | ICD-10-CM | POA: Diagnosis not present

## 2019-01-17 DIAGNOSIS — E1165 Type 2 diabetes mellitus with hyperglycemia: Secondary | ICD-10-CM | POA: Diagnosis not present

## 2019-01-18 DIAGNOSIS — E1165 Type 2 diabetes mellitus with hyperglycemia: Secondary | ICD-10-CM | POA: Diagnosis not present

## 2019-01-19 DIAGNOSIS — E1165 Type 2 diabetes mellitus with hyperglycemia: Secondary | ICD-10-CM | POA: Diagnosis not present

## 2019-01-20 DIAGNOSIS — E1165 Type 2 diabetes mellitus with hyperglycemia: Secondary | ICD-10-CM | POA: Diagnosis not present

## 2019-01-21 DIAGNOSIS — E1165 Type 2 diabetes mellitus with hyperglycemia: Secondary | ICD-10-CM | POA: Diagnosis not present

## 2019-01-27 ENCOUNTER — Ambulatory Visit: Payer: Self-pay | Admitting: *Deleted

## 2019-01-27 NOTE — Telephone Encounter (Signed)
Please schedule patient appointment. Patient also to go to UC if pain persists.

## 2019-01-27 NOTE — Telephone Encounter (Signed)
Patient reports swelling in left hand/wrist and left foot/ankle. Patient states swelling comes and goes.Swelling has been present for a month.  Patient was doing physical therapy after the stroke and had marked improvement- he states since it has stopped due to COVID- he has not done as well- elevation helps the foot decrease the swelling- but not the hand.  Message sent to office for scheduling since call was taken during lunch hours.  Reason for Disposition . [1] MILD swelling of both ankles (i.e., pedal edema) AND [2] new onset or worsening  Answer Assessment - Initial Assessment Questions 1. ONSET: "When did the swelling start?" (e.g., minutes, hours, days)     Noticed 1 month ago 2. LOCATION: "What part of the leg is swollen?"  "Are both legs swollen or just one leg?"     Left side- foot and ankle 3. SEVERITY: "How bad is the swelling?" (e.g., localized; mild, moderate, severe)  - Localized - small area of swelling localized to one leg  - MILD pedal edema - swelling limited to foot and ankle, pitting edema < 1/4 inch (6 mm) deep, rest and elevation eliminate most or all swelling  - MODERATE edema - swelling of lower leg to knee, pitting edema > 1/4 inch (6 mm) deep, rest and elevation only partially reduce swelling  - SEVERE edema - swelling extends above knee, facial or hand swelling present      mild 4. REDNESS: "Does the swelling look red or infected?"     no 5. PAIN: "Is the swelling painful to touch?" If so, ask: "How painful is it?"   (Scale 1-10; mild, moderate or severe)     No pian 6. FEVER: "Do you have a fever?" If so, ask: "What is it, how was it measured, and when did it start?"      no 7. CAUSE: "What do you think is causing the leg swelling?"     Patient does have history of recent stroke- he was doing physical therapy after the stroke and he was improving with movement and speech- now he is not improving and having swelling   8. MEDICAL HISTORY: "Do you have a history  of heart failure, kidney disease, liver failure, or cancer?"     Recent stroke 9. RECURRENT SYMPTOM: "Have you had leg swelling before?" If so, ask: "When was the last time?" "What happened that time?"     Never had swelling before- patient does have residual effects from the surgery 10. OTHER SYMPTOMS: "Do you have any other symptoms?" (e.g., chest pain, difficulty breathing)       no 11. PREGNANCY: "Is there any chance you are pregnant?" "When was your last menstrual period?"       n/a  Protocols used: LEG SWELLING AND EDEMA-A-AH

## 2019-01-31 ENCOUNTER — Ambulatory Visit (INDEPENDENT_AMBULATORY_CARE_PROVIDER_SITE_OTHER): Payer: Medicaid Other | Admitting: Family Medicine

## 2019-01-31 ENCOUNTER — Other Ambulatory Visit: Payer: Self-pay

## 2019-01-31 ENCOUNTER — Encounter: Payer: Self-pay | Admitting: Family Medicine

## 2019-01-31 VITALS — BP 122/78 | HR 69 | Temp 97.5°F | Resp 14 | Ht 64.0 in | Wt 155.2 lb

## 2019-01-31 DIAGNOSIS — Z720 Tobacco use: Secondary | ICD-10-CM

## 2019-01-31 DIAGNOSIS — Z794 Long term (current) use of insulin: Secondary | ICD-10-CM | POA: Diagnosis not present

## 2019-01-31 DIAGNOSIS — M542 Cervicalgia: Secondary | ICD-10-CM | POA: Diagnosis not present

## 2019-01-31 DIAGNOSIS — E785 Hyperlipidemia, unspecified: Secondary | ICD-10-CM

## 2019-01-31 DIAGNOSIS — I69354 Hemiplegia and hemiparesis following cerebral infarction affecting left non-dominant side: Secondary | ICD-10-CM | POA: Diagnosis not present

## 2019-01-31 DIAGNOSIS — M79605 Pain in left leg: Secondary | ICD-10-CM

## 2019-01-31 DIAGNOSIS — E1159 Type 2 diabetes mellitus with other circulatory complications: Secondary | ICD-10-CM

## 2019-01-31 DIAGNOSIS — Z1159 Encounter for screening for other viral diseases: Secondary | ICD-10-CM | POA: Diagnosis not present

## 2019-01-31 DIAGNOSIS — K219 Gastro-esophageal reflux disease without esophagitis: Secondary | ICD-10-CM

## 2019-01-31 DIAGNOSIS — I10 Essential (primary) hypertension: Secondary | ICD-10-CM

## 2019-01-31 DIAGNOSIS — M792 Neuralgia and neuritis, unspecified: Secondary | ICD-10-CM

## 2019-01-31 MED ORDER — INSULIN DETEMIR 100 UNIT/ML FLEXPEN: 20.0000 [IU] | PEN_INJECTOR | Freq: Every day | SUBCUTANEOUS | 11 refills | Status: DC

## 2019-01-31 MED ORDER — TIZANIDINE HCL 2 MG PO TABS: 2.0000 mg | ORAL_TABLET | Freq: Every evening | ORAL | 0 refills | Status: DC | PRN

## 2019-01-31 MED ORDER — PANTOPRAZOLE SODIUM 20 MG PO TBEC: 20.0000 mg | DELAYED_RELEASE_TABLET | Freq: Every day | ORAL | 0 refills | Status: DC

## 2019-01-31 NOTE — Progress Notes (Signed)
Name: Evan GOLDRING Sr.   MRN: [id number]    DOB: 07-Feb-1956   Date:01/31/2019       Progress Note  Subjective  Chief Complaint  Chief Complaint  Patient presents with  . Follow-up  . Arm Pain    left onset 1 day  . Neck Pain    onset 1 day    HPI  HTN: Taking amlodipine 10mg , Lisinopril 40mg , BP at goal today.  Denies chest pain, shortness of breath, headaches, blurred vision. No concerns. Stable from last check.   HLD: Taking atorvastatin daily, no chest pain, shortness of breath, or myalgias. Unchanged, stable. Due for labs  Hx Stroke/LEFT Hemiplegia: Stroke in January 2019, affecting LEFT side function; wearing brace on the LEFT LE. Seeing Dr. Leonie Man with neurology - last visit was 11/10/2018 - taking Xarelto and 81mg  ASA. Stable since last check. - LEFT Ankle Swelling: On side that he had a stroke.  Has had swelling for several months, goes away overnight with elevation, wearing leg brace during the day.  No tenderness or erythema. - LEFT Hand Pain: Feels tight like it is swollen, even though it is not.  He is taking tramadol PRN, there is no swelling in the upper arm.  - Did PT for both the ankle and the hand just after his stroke, but it did not seem to help at the time, but he would like to try again - we will refer today.    Indigestion:  He notes for the last several months he has burping and regurgitation with sour taste.  He denies vomiting, blood in stool/dark and tarry stool, difficulty swallowing or abdominal pain.  He is taking plavix and 81mg  ASA. He has tried tums and famotidine this has not seemed to help.  Neck and LEFT shoulder pain: Started today after sleeping on his side the wrong way.  He does have late effects of stroke on the LEFT side, muscles are tense and somewhat tender on the trapezius.  Has baseline weakness on the LEFT upper extremity.  Diabetes mellitus type 2 Checking sugars?  yes How often? Daily Range (low to high) over last two weeks:   150-160 Trying to limit white bread, white rice, white potatoes, sweets?  yes Trying to limit sweetened drinks like iced tea, soft drinks, sports drinks, fruit juices?  No - drinking pepsi's twice weekly Checking feet every day/night?  yes Last eye exam:  April 2020 Denies: Polyuria, polydipsia, polyphagia, vision changes, or neuropathy. Most recent A1C:  Lab Results  Component Value Date   HGBA1C 10.3 (H) 02/10/2018    We will recheck today. Last CMP Results : is due for repeat today    Component Value Date/Time   NA 140 09/07/2018 0641   NA 144 02/10/2018 1906   NA 135 (L) 03/15/2013 1649   K 4.4 09/07/2018 0641   K 4.3 03/15/2013 1649   CL 112 (H) 09/07/2018 0641   CL 97 (L) 03/15/2013 1649   CO2 21 (L) 09/07/2018 0641   CO2 30 03/15/2013 1649   GLUCOSE 161 (H) 09/07/2018 0641   GLUCOSE 372 (H) 03/15/2013 1649   BUN 37 (H) 09/07/2018 0641   BUN 16 02/10/2018 1906   BUN 21 (H) 03/15/2013 1649   CREATININE 1.39 (H) 09/20/2018 0629   CREATININE 1.30 03/15/2013 1649   CALCIUM 9.0 09/07/2018 0641   CALCIUM 9.8 03/15/2013 1649   PROT 6.8 09/07/2018 0641   PROT 6.7 02/10/2018 1906   PROT 7.9 03/15/2013 1649  ALBUMIN 3.6 09/07/2018 0641   ALBUMIN 4.3 02/10/2018 1906   ALBUMIN 4.0 03/15/2013 1649   AST 58 (H) 09/07/2018 0641   AST 11 (L) 03/15/2013 1649   ALT 107 (H) 09/07/2018 0641   ALT 31 03/15/2013 1649   ALKPHOS 120 09/07/2018 0641   ALKPHOS 131 03/15/2013 1649   BILITOT 0.3 09/07/2018 0641   BILITOT 0.2 02/10/2018 1906   BILITOT 0.2 03/15/2013 1649   GFRNONAA 54 (L) 09/20/2018 0629   GFRNONAA >60 03/15/2013 1649   GFRAA >60 09/20/2018 0629   GFRAA >60 03/15/2013 1649   Urine Micro UTD? No Current Medication Management: Diabetic Medications:  ACEI/ARB: Yes Statin: Yes Aspirin therapy: Yes   Patient Active Problem List   Diagnosis Date Noted  . Hemiplegia and hemiparesis following cerebral infarction affecting left non-dominant side (HCC) 11/25/2018  .  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 RBBB 10/15/2018  . Sinus bradycardia 10/15/2018  . Dysphagia, post-stroke   . Neuropathic pain   . Lacunar infarct, acute (HCC) 09/06/2018  . TIA (transient ischemic attack) 09/01/2018  . Microalbuminuria 07/29/2018  . Left leg pain 01/13/2018  . Hyperlipidemia 02/11/2017  . Essential hypertension 11/10/2016  . Diabetes (HCC) 11/10/2016  . Tobacco abuse 08/28/2013    History reviewed. No pertinent surgical history.  Family History  Problem Relation Age of Onset  . Hypertension Mother   . Hypertension Father   . Diabetes Father     Social History   Socioeconomic History  . Marital status: Married    Spouse name: Solicitorbeverly Loeber  . Number of children: 5  . Years of education: Not on file  . Highest education level: Not on file  Occupational History  . Occupation: unemployed  Social Needs  . Financial resource strain: Very hard  . Food insecurity    Worry: Never true    Inability: Never true  . Transportation needs    Medical: No    Non-medical: No  Tobacco Use  . Smoking status: Current Every Day Smoker    Packs/day: 0.25    Types: Cigarettes    Start date: 501982  . Smokeless tobacco: Never Used  Substance and Sexual Activity  . Alcohol use: Yes    Alcohol/week: 1.0 standard drinks    Types: 1 Cans of beer per week  . Drug use: No  . Sexual activity: Yes  Lifestyle  . Physical activity    Days per week: 0 days    Minutes per session: Not on file  . Stress: Only a little  Relationships  . Social connections    Talks on phone: More than three times a week    Gets together: Once a week    Attends religious service: More than 4 times per year    Active member of club or organization: No    Attends meetings of clubs or organizations: Never    Relationship status: Married  . Intimate partner violence    Fear of current or ex partner: No    Emotionally abused: No    Physically abused: No    Forced sexual activity: No  Other  Topics Concern  . Not on file  Social History Narrative  . Not on file     Current Outpatient Medications:  .  acetaminophen (TYLENOL) 325 MG tablet, Take 2 tablets (650 mg total) by mouth every 4 (four) hours as needed for mild pain (or temp > 37.5 C (99.5 F))., Disp: , Rfl:  .  amLODipine (NORVASC) 10 MG tablet, Take  10 mg by mouth daily., Disp: , Rfl:  .  aspirin 81 MG EC tablet, Take 1 tablet (81 mg total) by mouth daily., Disp: 30 tablet, Rfl:  .  atorvastatin (LIPITOR) 80 MG tablet, Take 1 tablet (80 mg total) by mouth every evening., Disp: 30 tablet, Rfl: 0 .  clopidogrel (PLAVIX) 75 MG tablet, Take 1 tablet (75 mg total) by mouth daily., Disp: 30 tablet, Rfl: 1 .  gabapentin (NEURONTIN) 300 MG capsule, Take 2 capsules (600 mg total) by mouth 2 (two) times daily., Disp: 60 capsule, Rfl: 1 .  glipiZIDE (GLUCOTROL) 5 MG tablet, Take 1 tablet (5 mg total) by mouth daily before breakfast., Disp: 30 tablet, Rfl: 1 .  Insulin Detemir (LEVEMIR) 100 UNIT/ML Pen, Inject 20 Units into the skin daily., Disp: 15 mL, Rfl: 11 .  lisinopril (PRINIVIL,ZESTRIL) 40 MG tablet, Take 1 tablet (40 mg total) by mouth daily., Disp: 30 tablet, Rfl: 1 .  Maltodextrin-Xanthan Gum (RESOURCE THICKENUP CLEAR) POWD, Take 120 g by mouth as needed., Disp: 1 Can, Rfl: 1 .  nicotine (NICODERM CQ - DOSED IN MG/24 HOURS) 14 mg/24hr patch, 14 mg patch daily 2 weeks then 7 mg patch daily 3 weeks and stop, Disp: 28 patch, Rfl: 0 .  tobramycin-dexamethasone (TOBRADEX) ophthalmic solution, Place 1 drop into the left eye every 4 (four) hours while awake., Disp: 5 mL, Rfl: 0 .  traMADol (ULTRAM) 50 MG tablet, Take 1 tablet (50 mg total) by mouth every 6 (six) hours as needed for moderate pain., Disp: 20 tablet, Rfl: 0 .  pantoprazole (PROTONIX) 20 MG tablet, Take 1 tablet (20 mg total) by mouth daily., Disp: 180 tablet, Rfl: 0 .  tiZANidine (ZANAFLEX) 2 MG tablet, Take 1 tablet (2 mg total) by mouth at bedtime as needed for  muscle spasms (neck pain)., Disp: 30 tablet, Rfl: 0  No Known Allergies  I personally reviewed active problem list, medication list, allergies, notes from last encounter, lab results with the patient/caregiver today.   ROS Constitutional: Negative for fever or weight change.  Respiratory: Negative for cough and shortness of breath.   Cardiovascular: Negative for chest pain or palpitations.  Gastrointestinal: Negative for abdominal pain, no bowel changes.  Musculoskeletal: + for gait problem secondary to CVA or joint swelling.  Skin: Negative for rash.  Neurological: Negative for dizziness or headache.  No other specific complaints in a complete review of systems (except as listed in HPI above).  Objective  Vitals:   01/31/19 1224  BP: 122/78  Pulse: 69  Resp: 14  Temp: (!) 97.5 F (36.4 C)  TempSrc: Oral  SpO2: 95%  Weight: 155 lb 3.2 oz (70.4 kg)  Height: 5\' 4"  (1.626 m)    Body mass index is 26.64 kg/m.  Physical Exam Constitutional: Patient appears well-developed and well-nourished. No distress.  HENT: Head: Normocephalic and atraumatic. Ears: bilateral TMs with no erythema or effusion; Nose: Nose normal. Mouth/Throat: Oropharynx is clear and moist. No oropharyngeal exudate or tonsillar swelling.  Eyes: Conjunctivae and EOM are normal. No scleral icterus.  Pupils are equal, round, and reactive to light.  Neck: Normal range of motion. Neck supple. No JVD present. No thyromegaly present.  Cardiovascular: Normal rate, regular rhythm and normal heart sounds.  No murmur heard. LLE edema present but trace, RLE no edema. Pulmonary/Chest: Effort normal and breath sounds normal. No respiratory distress. Musculoskeletal: No joint effusions. RIGHT hand stays in moderately flexed position at rest, grip is very weak, LEFT shoulder has some AROM,  but quite limited.   Neurological: Pt is alert and oriented to person, place, and time. No cranial nerve deficit. Coordination, balance,  strength, speech and gait are normal.  Skin: Skin is warm and dry. No rash noted. No erythema.  Psychiatric: Patient has a normal mood and affect. behavior is normal. Judgment and thought content normal. No results found for this or any previous visit (from the past 72 hour(s)).  PHQ2/9: Depression screen [facility] 2/9 01/31/2019 11/25/2018  Decreased Interest 0 0  Down, Depressed, Hopeless 0 0  PHQ - 2 Score 0 0  Altered sleeping 0 0  Tired, decreased energy 0 0  Change in appetite 0 0  Feeling bad or failure about yourself  0 0  Trouble concentrating 0 0  Moving slowly or fidgety/restless 0 0  Suicidal thoughts 0 0  PHQ-9 Score 0 0  Difficult doing work/chores Not difficult at all Not difficult at all   PHQ-2/9 Result is negative.    Fall Risk: Fall Risk  01/31/2019 01/31/2019 12/06/2018 11/25/2018  Falls in the past year? 0 0 0 0  Number falls in past yr: 0 0 - 0  Injury with Fall? 0 0 - 0  Follow up - - - Falls evaluation completed    Functional Status Survey: Is the patient deaf or have difficulty hearing?: No Does the patient have difficulty seeing, even when wearing glasses/contacts?: No Does the patient have difficulty concentrating, remembering, or making decisions?: No Does the patient have difficulty walking or climbing stairs?: No Does the patient have difficulty dressing or bathing?: No Does the patient have difficulty doing errands alone such as visiting a doctor's office or shopping?: No   Assessment & Plan  1. Neck pain on left side - Consider PT if not improving with low dose tizanidine - tiZANidine (ZANAFLEX) 2 MG tablet; Take 1 tablet (2 mg total) by mouth at bedtime as needed for muscle spasms (neck pain).  Dispense: 30 tablet; Refill: 0  2. Hemiplegia and hemiparesis following cerebral infarction affecting left non-dominant side (HCC) - Needs PT for weakness and swelling in LUE and LLE - Ambulatory referral to Physical Therapy - Lipid panel  3. Type 2  diabetes mellitus with other circulatory complication, with long-term current use of insulin (HCC) - Lipid panel - Hemoglobin A1c - COMPLETE METABOLIC PANEL WITH GFR - Insulin Detemir (LEVEMIR) 100 UNIT/ML Pen; Inject 20 Units into the skin daily.  Dispense: 15 mL; Refill: 11 - Urine Microalbumin w/creat. ratio  4. Essential hypertension - COMPLETE METABOLIC PANEL WITH GFR  5. Tobacco abuse - He has quit and is no longer smoking  6. Hyperlipidemia, unspecified hyperlipidemia type - Lipid panel  7. Gastroesophageal reflux disease without esophagitis - STOP famotidine as it was ineffective, trial of protonix instead. - pantoprazole (PROTONIX) 20 MG tablet; Take 1 tablet (20 mg total) by mouth daily.  Dispense: 180 tablet; Refill: 0  8. Neuropathic pain - tiZANidine (ZANAFLEX) 2 MG tablet; Take 1 tablet (2 mg total) by mouth at bedtime as needed for muscle spasms (neck pain).  Dispense: 30 tablet; Refill: 0  9. Left leg pain - tiZANidine (ZANAFLEX) 2 MG tablet; Take 1 tablet (2 mg total) by mouth at bedtime as needed for muscle spasms (neck pain).  Dispense: 30 tablet; Refill: 0 - Ambulatory referral to Physical Therapy  10. Need for hepatitis C screening test - Hepatitis C antibody

## 2019-02-01 LAB — HEPATITIS C ANTIBODY
Hepatitis C Ab: NONREACTIVE
SIGNAL TO CUT-OFF: 0.03 (ref <1.00)

## 2019-02-01 LAB — HEMOGLOBIN A1C
Hgb A1c MFr Bld: 9.3 % of total Hgb — ABNORMAL HIGH (ref <5.7)
Mean Plasma Glucose: 220 (calc)
eAG (mmol/L): 12.2 (calc)

## 2019-02-01 LAB — COMPLETE METABOLIC PANEL WITH GFR
AG Ratio: 1.7 (calc) (ref 1.0–2.5)
ALT: 55 U/L — ABNORMAL HIGH (ref 9–46)
AST: 25 U/L (ref 10–35)
Albumin: 4.2 g/dL (ref 3.6–5.1)
Alkaline phosphatase (APISO): 144 U/L (ref 35–144)
BUN: 19 mg/dL (ref 7–25)
CO2: 23 mmol/L (ref 20–32)
Calcium: 9.4 mg/dL (ref 8.6–10.3)
Chloride: 107 mmol/L (ref 98–110)
Creat: 0.99 mg/dL (ref 0.70–1.25)
GFR, Est African American: 95 mL/min/{1.73_m2} (ref >=60)
GFR, Est Non African American: 82 mL/min/{1.73_m2} (ref >=60)
Globulin: 2.5 g/dL (calc) (ref 1.9–3.7)
Glucose, Bld: 238 mg/dL — ABNORMAL HIGH (ref 65–99)
Potassium: 4.1 mmol/L (ref 3.5–5.3)
Sodium: 140 mmol/L (ref 135–146)
Total Bilirubin: 0.3 mg/dL (ref 0.2–1.2)
Total Protein: 6.7 g/dL (ref 6.1–8.1)

## 2019-02-01 LAB — MICROALBUMIN / CREATININE URINE RATIO
Creatinine, Urine: 96 mg/dL (ref 20–320)
Microalb Creat Ratio: 336 mcg/mg creat — ABNORMAL HIGH (ref <30)
Microalb, Ur: 32.3 mg/dL

## 2019-02-01 LAB — LIPID PANEL
Cholesterol: 131 mg/dL (ref <200)
HDL: 47 mg/dL (ref >=40)
LDL Cholesterol (Calc): 54 mg/dL (calc)
Non-HDL Cholesterol (Calc): 84 mg/dL (calc) (ref <130)
Total CHOL/HDL Ratio: 2.8 (calc) (ref <5.0)
Triglycerides: 239 mg/dL — ABNORMAL HIGH (ref <150)

## 2019-02-02 ENCOUNTER — Ambulatory Visit: Payer: Medicaid Other | Admitting: Family Medicine

## 2019-02-02 ENCOUNTER — Other Ambulatory Visit: Payer: Self-pay

## 2019-02-02 ENCOUNTER — Encounter: Payer: Self-pay | Admitting: Physical Therapy

## 2019-02-02 ENCOUNTER — Ambulatory Visit: Payer: Medicaid Other | Attending: Family Medicine | Admitting: Physical Therapy

## 2019-02-02 VITALS — BP 167/83 | HR 56

## 2019-02-02 DIAGNOSIS — R262 Difficulty in walking, not elsewhere classified: Secondary | ICD-10-CM | POA: Insufficient documentation

## 2019-02-02 DIAGNOSIS — R278 Other lack of coordination: Secondary | ICD-10-CM | POA: Diagnosis not present

## 2019-02-02 DIAGNOSIS — I6381 Other cerebral infarction due to occlusion or stenosis of small artery: Secondary | ICD-10-CM | POA: Diagnosis not present

## 2019-02-02 DIAGNOSIS — M6281 Muscle weakness (generalized): Secondary | ICD-10-CM | POA: Diagnosis not present

## 2019-02-02 DIAGNOSIS — M79602 Pain in left arm: Secondary | ICD-10-CM | POA: Insufficient documentation

## 2019-02-02 DIAGNOSIS — I69354 Hemiplegia and hemiparesis following cerebral infarction affecting left non-dominant side: Secondary | ICD-10-CM | POA: Diagnosis not present

## 2019-02-02 NOTE — Therapy (Signed)
[facility] [address] [place], [place], [address] Phone: [phone]   Fax:  [phone]  Physical Therapy Evaluation  Patient Details  Name: Evan AnoDuncan E Trindade Sr. MRN: [id number] Date of Birth: 03/11/1956 Referring Provider (PT): Maurice SmallBoyce, Emily   Encounter Date: 02/02/2019  PT End of Session - 02/02/19 1833    Visit Number  1    Number of Visits  4    Date for PT Re-Evaluation  03/02/19    PT Start Time  1010    PT Stop Time  1105    PT Time Calculation (min)  55 min    Equipment Utilized During Treatment  Gait belt;Other (comment)   L AFO   Activity Tolerance  Patient tolerated treatment well    Behavior During Therapy  WFL for tasks assessed/performed       Past Medical History:  Diagnosis Date  . Acute CVA (cerebrovascular accident) (HCC) 11/10/2016  . Diabetes 1.5, managed as type 2 (HCC)   . Diabetes mellitus without complication (HCC)   . Hypercholesterolemia   . Hypertension   . Pain in both feet   . Stroke [facility](HCC)     History reviewed. No pertinent surgical history.  Vitals:   02/02/19 1026  BP: (!) 167/83  Pulse: (!) 56  SpO2: 100%     Subjective Assessment - 02/02/19 1035    Currently in Pain?  Yes    Pain Score  8     Pain Location  Shoulder    Pain Orientation  Left;Proximal    Pain Descriptors / Indicators  Nagging    Pain Type  Acute pain    Pain Onset  1 to 4 weeks ago     SUBJECTIVE Chief complaint: Stroke in 2019; L hemiparesis/hemiplegia after stroke (Jan 2019; Jan 2020). Patient states that he is very motivated and has found PT to be very helpful previously. Imaging: See chart. Recent changes in overall health/medication: No Directional pattern for falls: None reported Prior history of physical therapy for balance or post-stroke: Yes; inpatient rehab with [place] Follow-up appointment with MD: in 1 month Red flags (bowel/bladder changes, saddle paresthesia, personal history of cancer,  chills/fever, night sweats, unrelenting pain) Negative     [facility] PT Assessment - 02/02/19 0001      Assessment   Medical Diagnosis  L hemiparesis    Referring Provider (PT)  Maurice SmallBoyce, Emily    Hand Dominance  Right    Next MD Visit  03/04/2019    Prior Therapy  Yes      Precautions   Precautions  Fall      Balance Screen   Has the patient fallen in the past 6 months  No      Standardized Balance Assessment   Standardized Balance Assessment  Berg Balance Test      Berg Balance Test   Sit to Stand  Able to stand  independently using hands    Standing Unsupported  Able to stand safely 2 minutes    Sitting with Back Unsupported but Feet Supported on Floor or Stool  Able to sit safely and securely 2 minutes    Stand to Sit  Controls descent by using hands    Transfers  Able to transfer safely, definite need of hands    Standing Unsupported with Eyes Closed  Able to stand 10 seconds with supervision    Standing Unsupported with Feet Together  Able to place feet together independently and stand for  1 minute with supervision    From Standing, Reach Forward with Outstretched Arm  Can reach forward >12 cm safely (5")    From Standing Position, Pick up Object from Floor  Able to pick up shoe, needs supervision    From Standing Position, Turn to Look Behind Over each Shoulder  Looks behind from both sides and weight shifts well    Turn 360 Degrees  Needs close supervision or verbal cueing    Standing Unsupported, Alternately Place Feet on Step/Stool  Needs assistance to keep from falling or unable to try    Standing Unsupported, One Foot in Front  Able to take small step independently and hold 30 seconds    Standing on One Leg  Tries to lift leg/unable to hold 3 seconds but remains standing independently    Total Score  37    Berg comment:  Significant risk for falls       OBJECTIVE  MUSCULOSKELETAL: Tremor: Absent Bulk: Normal, mildly decreased on L Tone: increased on LUE and LLE, LUE>  LLE  Posture No gross abnormalities noted in standing or seated posture  Gait Patient has decreased LLE foot clearance even while donning AFO. Patient ambulates with SPC and while not appearing steady navigates turns in an open environment without incident.  Strength Testing: MMT  RLE LLE  Hip Flexion 5 3-  Knee Extension 5 3  Knee Flexion 5 3-  Dorsiflexion  5 4  Plantarflexion (seated) 5 4  Strength Testing: MMT  RUE LUE  Shoulder Elevation 5 2+  Shoulder Flexion 5 2+  Shoulder  Extension 5 2+  Shoulder Abduction  5 2+  Elbow Extension 5 2+  Elbow Flexion 5 3    NEUROLOGICAL:  Mental Status Patient is oriented to person, place and time.  Recent memory is intact.  Remote memory is intact.  Attention span and concentration are intact.  Expressive speech is intact.  Patient's fund of knowledge is within normal limits for educational level.  Sensation Grossly intact to light touch bilateral UEs/LEs as determined by testing dermatomes C2-T2/L2-S2 respectively Proprioception and hot/cold testing deferred on this date  Coordination/Cerebellar Finger to Nose: Unable to perform on L 2/2 to limitations in use of LUE  FUNCTIONAL OUTCOME MEASURES   Results Comments  BERG 37/56 Fall risk, in need of intervention    Objective measurements completed on examination: See above findings.     ASSESSMENT  Patient is a 63 year old presenting to clinic with chief complaints of LUE and LLE weakness post-stroke. Upon examination, patient demonstrates deficits in LUE and LLE strength, ROM, spasticity, balance, and gait. Patient's responses on FOTO outcome measures (28) indicate significant functional limitations/disability/distress. Patient's progress may be limited due to insurance limitations, severity of stroke, and health literacy; however, patient's motivation and commitment is advantageous. Patient was able to achieve L finger extension after cryotherapy during today's evaluation  and responded positively to active interventions. Patient will benefit from continued skilled therapeutic intervention to address deficits in LUE and LLE strength, ROM, spasticity, balance, and gait in order to optimize independence, increase function, and improve overall QOL.             PT Short Term Goals - 02/02/19 1838      PT SHORT TERM GOAL #1   Title  Patient will be independent with HEP for improved therapeutic gains and self-management.    Baseline  IE: not initiated    Time  4    Period  Weeks  Status  New    Target Date  03/02/19        PT Long Term Goals - 02/02/19 1853      PT LONG TERM GOAL #1   Title  Pt will improve BERG by at least 3 points in order to demonstrate clinically significant improvement in balance.    Baseline  IE: 37/56    Time  4    Period  Weeks    Status  New    Target Date  03/02/19      PT LONG TERM GOAL #2   Title  Pt will decrease 5TSTS by at least 3 seconds in order to demonstrate clinically significant improvement in LE strength.    Baseline  IE: unable to collect 2/2 to time limitations    Time  4    Period  Weeks    Status  New    Target Date  03/02/19      PT LONG TERM GOAL #3   Title  Patient will demonstrate improved LUE function as evidenced by an improvement of at least 5.2 points on the Fugl-Meyer UE assessment in order to improved independence.    Baseline  IE: not evaluated 2/2 to time    Time  4    Period  Weeks    Status  New    Target Date  03/02/19             Plan - 02/02/19 1834    Personal Factors and Comorbidities  Age;Education;Sex;Social Background;Behavior Pattern;Finances;Past/Current Experience;Time since onset of injury/illness/exacerbation    Examination-Activity Limitations  Bathing;Bed Mobility;Dressing;Transfers;Squat;Lift;Locomotion Level;Stairs;Reach Overhead;Carry;Stand    Examination-Participation Restrictions  Interpersonal Relationship;Yard Work;Laundry;Cleaning;Community  Activity;Meal Prep;Medication Management    Stability/Clinical Decision Making  Evolving/Moderate complexity    Clinical Decision Making  Moderate    Rehab Potential  Good    PT Frequency  2x / week    PT Duration  4 weeks    PT Treatment/Interventions  ADLs/Self Care Home Management;Aquatic Therapy;Moist Heat;Cryotherapy;Gait training;Stair training;Functional mobility training;Therapeutic activities;Therapeutic exercise;Patient/family education;Neuromuscular re-education;Balance training;Orthotic Fit/Training;Manual techniques;Passive range of motion;Dry needling;Taping;Energy conservation;Splinting;Joint Manipulations;Spinal Manipulations    PT Next Visit Plan  Fugl-Meyer, mirror therapy    PT Home Exercise Plan  cold water baths for L hand followed by AAROM extension with functional tasks    Consulted and Agree with Plan of Care  Patient       Patient will benefit from skilled therapeutic intervention in order to improve the following deficits and impairments:  Abnormal gait, Decreased balance, Decreased endurance, Decreased mobility, Difficulty walking, Hypomobility, Increased muscle spasms, Impaired tone, Decreased range of motion, Decreased coordination, Decreased strength, Impaired UE functional use, Pain  Visit Diagnosis: 1. Hemiplegia and hemiparesis following cerebral infarction affecting left non-dominant side (HCC)   2. Other lack of coordination   3. Muscle weakness (generalized)   4. Difficulty in walking, not elsewhere classified   5. Pain in left arm        Problem List Patient Active Problem List   Diagnosis Date Noted  . Hemiplegia and hemiparesis following cerebral infarction affecting left non-dominant side ([place]) 11/25/2018  . 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 RBBB 10/15/2018  . Sinus bradycardia 10/15/2018  . Dysphagia, post-stroke   . Neuropathic pain   . Lacunar infarct, acute ([place]) 09/06/2018  . TIA (transient ischemic attack) 09/01/2018  . Microalbuminuria  07/29/2018  . Left leg pain 01/13/2018  . Hyperlipidemia 02/11/2017  . Essential hypertension 11/10/2016  . Diabetes ([place]) 11/10/2016  . Tobacco abuse  08/28/2013   Sheria LangKatlin Dreden Rivere PT, DPT [phone] 02/02/2019, 7:01 PM  [facility] [address] [place], [place], [address] Phone: [phone]   Fax:  [phone]  Name: Evan Anouncan E Lesniak Sr. MRN: [id number] Date of Birth: 03/11/1956

## 2019-02-03 ENCOUNTER — Other Ambulatory Visit: Payer: Self-pay | Admitting: Family Medicine

## 2019-02-03 DIAGNOSIS — R809 Proteinuria, unspecified: Secondary | ICD-10-CM

## 2019-02-07 ENCOUNTER — Ambulatory Visit: Payer: Medicaid Other | Admitting: Physical Therapy

## 2019-02-07 ENCOUNTER — Other Ambulatory Visit: Payer: Self-pay

## 2019-02-07 DIAGNOSIS — M79602 Pain in left arm: Secondary | ICD-10-CM | POA: Diagnosis not present

## 2019-02-07 DIAGNOSIS — I6381 Other cerebral infarction due to occlusion or stenosis of small artery: Secondary | ICD-10-CM

## 2019-02-07 DIAGNOSIS — R278 Other lack of coordination: Secondary | ICD-10-CM

## 2019-02-07 DIAGNOSIS — I69354 Hemiplegia and hemiparesis following cerebral infarction affecting left non-dominant side: Secondary | ICD-10-CM | POA: Diagnosis not present

## 2019-02-07 DIAGNOSIS — R262 Difficulty in walking, not elsewhere classified: Secondary | ICD-10-CM

## 2019-02-07 DIAGNOSIS — M6281 Muscle weakness (generalized): Secondary | ICD-10-CM | POA: Diagnosis not present

## 2019-02-07 NOTE — Therapy (Signed)
[facility] [address] [place], [place], [address] Phone: [phone]   Fax:  [phone]  Physical Therapy Treatment  Patient Details  Name: Evan AnoDuncan E Sayres Sr. MRN: [id number] Date of Birth: 03/11/1956 Referring Provider (PT): Maurice SmallBoyce, Emily   Encounter Date: 02/07/2019  PT End of Session - 02/07/19 1135    Visit Number  2    Number of Visits  4    Date for PT Re-Evaluation  03/02/19    PT Start Time  1005    PT Stop Time  1051    PT Time Calculation (min)  46 min    Equipment Utilized During Treatment  Gait belt    Activity Tolerance  Patient tolerated treatment well    Behavior During Therapy  WFL for tasks assessed/performed       Past Medical History:  Diagnosis Date  . Acute CVA (cerebrovascular accident) (HCC) 11/10/2016  . Diabetes 1.5, managed as type 2 (HCC)   . Diabetes mellitus without complication (HCC)   . Hypercholesterolemia   . Hypertension   . Pain in both feet   . Stroke [facility](HCC)     No past surgical history on file.  There were no vitals filed for this visit.  Subjective Assessment - 02/07/19 1007    Subjective  Patient has been trying to do his AAROM finger extension with shoulder abduction/adduction and reports it is still cramping up. Patient denies any big changes. Patient does ambulate with SPC and without AFO in clinic today.    Currently in Pain?  Yes    Pain Score  8     Pain Orientation  Left    Pain Descriptors / Indicators  Cramping       TREATMENT  Manual Therapy: STM/TPR L trapezius, suboccipitals, L periscapular area, L supraspinatus  Neuromuscular Re-education: Administered Fugl-Meyer UE Protocol: 29/66 (A: 13/36; B:4/10; C:9/14; D:3/6) indicative of severe-moderate dysfunction Seated active posture with chin tuck: 5 sec hold x10 Seated AAROM wrist and finger extension 3x 1 min hold Seated L PNF D2 (modified): Shoulder x15 Seated L AAROM shoulder flexion, elbow extension slides  x10 PT assisted L PROM with manual techniques to inhibit tone Standing LUE CKC WB with elbow, wrist, finger extension x10   Patient educated throughout session on appropriate technique and form using multi-modal cueing, HEP, and activity modification. Patient articulated understanding and returned demonstration.  Patient Response to interventions: Patient denies any increase in pain.   ASSESSMENT Patient presents to clinic with excellent motivation to participate in therapy. Patient demonstrates deficits in LUE function, LUE ROM, LUE strength and coordination as evidenced by Fugl-Meyer UE Protocol score of 29/66. Patient able to achieve maintained wrist and finger extension for > 2 min after PROM/AAROM during today's session and responded positively to manual interventions. Patient will benefit from continued skilled therapeutic intervention to address remaining deficits in LUE function, LUE ROM, LUE strength and coordination in order to optimize independence, increase function, and improve overall QOL.    PT Short Term Goals - 02/02/19 1838      PT SHORT TERM GOAL #1   Title  Patient will be independent with HEP for improved therapeutic gains and self-management.    Baseline  IE: not initiated    Time  4    Period  Weeks    Status  New    Target Date  03/02/19        PT Long Term Goals - 02/07/19 1153  PT LONG TERM GOAL #1   Title  Pt will improve BERG by at least 3 points in order to demonstrate clinically significant improvement in balance.    Baseline  IE: 37/56    Time  4    Period  Weeks    Status  New    Target Date  03/02/19      PT LONG TERM GOAL #2   Title  Pt will decrease 5TSTS by at least 3 seconds in order to demonstrate clinically significant improvement in LE strength.    Baseline  IE: unable to collect 2/2 to time limitations    Time  4    Period  Weeks    Status  New    Target Date  03/02/19      PT LONG TERM GOAL #3   Title  Patient will  demonstrate improved LUE function as evidenced by an improvement of at least 5.2 points on the Fugl-Meyer UE assessment in order to improved independence.    Baseline  IE: not evaluated 2/2 to time; 02/07/2019: 29/66 (A: 13/36; B:4/10; C:9/14; D:3/6) indicative of severe-moderate dysfunction    Time  4    Period  Weeks    Status  New    Target Date  03/02/19            Plan - 02/07/19 1136    Clinical Impression Statement  Patient presents to clinic with excellent motivation to participate in therapy. Patient demonstrates deficits in LUE function, LUE ROM, LUE strength and coordination as evidenced by Fugl-Meyer UE Protocol score of 29/66. Patient able to achieve maintained wrist and finger extension for > 2 min after PROM/AAROM during today's session and responded positively to manual interventions. Patient will benefit from continued skilled therapeutic intervention to address remaining deficits in LUE function, LUE ROM, LUE strength and coordination in order to optimize independence, increase function, and improve overall QOL.    Personal Factors and Comorbidities  Age;Education;Sex;Social Background;Behavior Pattern;Finances;Past/Current Experience;Time since onset of injury/illness/exacerbation    Examination-Activity Limitations  Bathing;Bed Mobility;Dressing;Transfers;Squat;Lift;Locomotion Level;Stairs;Reach Overhead;Carry;Stand    Examination-Participation Restrictions  Interpersonal Relationship;Yard Work;Laundry;Cleaning;Community Activity;Meal Prep;Medication Management    Stability/Clinical Decision Making  Evolving/Moderate complexity    Rehab Potential  Good    PT Frequency  2x / week    PT Duration  4 weeks    PT Treatment/Interventions  ADLs/Self Care Home Management;Aquatic Therapy;Moist Heat;Cryotherapy;Gait training;Stair training;Functional mobility training;Therapeutic activities;Therapeutic exercise;Patient/family education;Neuromuscular re-education;Balance  training;Orthotic Fit/Training;Manual techniques;Passive range of motion;Dry needling;Taping;Energy conservation;Splinting;Joint Manipulations;Spinal Manipulations    PT Next Visit Plan  PNF patterns, CKC UE extension promotion,mirror therapy    PT Home Exercise Plan  seated postural holds, PROM wrist extension, grip strength    Consulted and Agree with Plan of Care  Patient       Patient will benefit from skilled therapeutic intervention in order to improve the following deficits and impairments:  Abnormal gait, Decreased balance, Decreased endurance, Decreased mobility, Difficulty walking, Hypomobility, Increased muscle spasms, Impaired tone, Decreased range of motion, Decreased coordination, Decreased strength, Impaired UE functional use, Pain  Visit Diagnosis: 1. Hemiplegia and hemiparesis following cerebral infarction affecting left non-dominant side (HCC)   2. Other lack of coordination   3. Muscle weakness (generalized)   4. Difficulty in walking, not elsewhere classified   5. Pain in left arm   6. Lacunar infarct, acute [facility](HCC)        Problem List Patient Active Problem List   Diagnosis Date Noted  . Hemiplegia and hemiparesis following  cerebral infarction affecting left non-dominant side ([place]) 11/25/2018  . 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 RBBB 10/15/2018  . Sinus bradycardia 10/15/2018  . Dysphagia, post-stroke   . Neuropathic pain   . Lacunar infarct, acute ([place]) 09/06/2018  . TIA (transient ischemic attack) 09/01/2018  . Microalbuminuria 07/29/2018  . Left leg pain 01/13/2018  . Hyperlipidemia 02/11/2017  . Essential hypertension 11/10/2016  . Diabetes ([place]) 11/10/2016  . Tobacco abuse 08/28/2013   Myles Gip PT, DPT [id number] 02/07/2019, 12:22 PM  [facility] [address] [place], [place], [address] Phone: [phone]   Fax:  [phone]  Name: Evan Cones Sr. MRN: [id number] Date of Birth: 05-09-1956

## 2019-02-09 ENCOUNTER — Encounter: Payer: Self-pay | Admitting: Physical Therapy

## 2019-02-09 ENCOUNTER — Ambulatory Visit: Payer: Medicaid Other | Admitting: Physical Therapy

## 2019-02-09 ENCOUNTER — Other Ambulatory Visit: Payer: Self-pay

## 2019-02-09 DIAGNOSIS — M6281 Muscle weakness (generalized): Secondary | ICD-10-CM | POA: Diagnosis not present

## 2019-02-09 DIAGNOSIS — E1165 Type 2 diabetes mellitus with hyperglycemia: Secondary | ICD-10-CM | POA: Diagnosis not present

## 2019-02-09 DIAGNOSIS — R262 Difficulty in walking, not elsewhere classified: Secondary | ICD-10-CM

## 2019-02-09 DIAGNOSIS — I69354 Hemiplegia and hemiparesis following cerebral infarction affecting left non-dominant side: Secondary | ICD-10-CM

## 2019-02-09 DIAGNOSIS — M79602 Pain in left arm: Secondary | ICD-10-CM

## 2019-02-09 DIAGNOSIS — R278 Other lack of coordination: Secondary | ICD-10-CM | POA: Diagnosis not present

## 2019-02-09 DIAGNOSIS — I6381 Other cerebral infarction due to occlusion or stenosis of small artery: Secondary | ICD-10-CM | POA: Diagnosis not present

## 2019-02-09 NOTE — Therapy (Signed)
[facility] [address] [place], [place], [address] Phone: [phone]   Fax:  [phone]  Physical Therapy Treatment  Patient Details  Name: Evan DERMODY Sr. MRN: [id number] Date of Birth: Apr 19, 1956 Referring Provider (PT): Raelyn Ensign   Encounter Date: 02/09/2019  PT End of Session - 02/09/19 1054    Visit Number  3    Number of Visits  4    Date for PT Re-Evaluation  03/02/19    PT Start Time  1005    PT Stop Time  1045    PT Time Calculation (min)  40 min    Equipment Utilized During Treatment  Gait belt    Activity Tolerance  Patient tolerated treatment well    Behavior During Therapy  WFL for tasks assessed/performed       Past Medical History:  Diagnosis Date  . Acute CVA (cerebrovascular accident) ([place]) 11/10/2016  . Diabetes 1.5, managed as type 2 ([place])   . Diabetes mellitus without complication ([place])   . Hypercholesterolemia   . Hypertension   . Pain in both feet   . Stroke [facility])     History reviewed. No pertinent surgical history.  There were no vitals filed for this visit.  Subjective Assessment - 02/09/19 1006    Subjective  Patient denies any concerns or changes since last visit. Patient does note decreased tension/discomfort in the L trapezius.       TREATMENT  Manual Therapy: STM/TPR L trapezius, suboccipitals, L periscapular area, L supraspinatus Rhythmic initiation elbow extension PROM x10  Neuromuscular Re-education: UBE x2 min fwd/ x2 min bwd with PT assisted LUE grip for improved elbow extension without trunk compensations Pincer grasp and release with clothes pin x10 Pincer grasp and release with LUE horizontal adduction and flexion with clothes pin x10 Sit to stand 2x5 with RUE support, VCs and TCs to encourage active LUE hip and knee extension  Patient educated throughout session on appropriate technique and form using multi-modal cueing, HEP, and activity modification. Patient  articulated understanding and returned demonstration.  Patient Response to interventions: Patient pleased with his performance with his LUE pincer grip and release. Eager to try at home.  ASSESSMENT Patient presents to clinic with excellent motivation to participate in therapy. Patient demonstrates deficits in LUE function and LLE function and as evidenced by increased reliance on RLE and RUE, need for PT assist to maintain grip on UBE. Patient able to achieve coordinated pincer grasp and release after increased repetitions with clothes pins during today's session and responded positively to manual interventions. Patient will benefit from continued skilled therapeutic intervention to address remaining deficits in LUE function, LUE ROM, LUE strength and coordination in order to optimize independence, increase function, and improve overall QOL.    PT Short Term Goals - 02/02/19 1838      PT SHORT TERM GOAL #1   Title  Patient will be independent with HEP for improved therapeutic gains and self-management.    Baseline  IE: not initiated    Time  4    Period  Weeks    Status  New    Target Date  03/02/19        PT Long Term Goals - 02/09/19 1423      PT LONG TERM GOAL #1   Title  Pt will improve BERG by at least 3 points in order to demonstrate clinically significant improvement in balance.    Baseline  IE: 37/56  Time  4    Period  Weeks    Status  New    Target Date  03/02/19      PT LONG TERM GOAL #2   Title  Pt will decrease 5TSTS by at least 3 seconds in order to demonstrate clinically significant improvement in LE strength.    Baseline  IE: unable to collect 2/2 to time limitations; 02/09/2019: 17.9 sec    Time  4    Period  Weeks    Status  New    Target Date  03/02/19      PT LONG TERM GOAL #3   Title  Patient will demonstrate improved LUE function as evidenced by an improvement of at least 5.2 points on the Fugl-Meyer UE assessment in order to improved independence.     Baseline  IE: not evaluated 2/2 to time; 02/07/2019: 29/66 (A: 13/36; B:4/10; C:9/14; D:3/6) indicative of severe-moderate dysfunction    Time  4    Period  Weeks    Status  New    Target Date  03/02/19            Plan - 02/09/19 1054    Clinical Impression Statement  Patient presents to clinic with excellent motivation to participate in therapy. Patient demonstrates deficits in LUE function and LLE function and as evidenced by increased reliance on RLE and RUE, need for PT assist to maintain grip on UBE. Patient able to achieve coordinated pincer grasp and release after increased repetitions with clothes pins during today's session and responded positively to manual interventions. Patient will benefit from continued skilled therapeutic intervention to address remaining deficits in LUE function, LUE ROM, LUE strength and coordination in order to optimize independence, increase function, and improve overall QOL.    Personal Factors and Comorbidities  Age;Education;Sex;Social Background;Behavior Pattern;Finances;Past/Current Experience;Time since onset of injury/illness/exacerbation    Examination-Activity Limitations  Bathing;Bed Mobility;Dressing;Transfers;Squat;Lift;Locomotion Level;Stairs;Reach Overhead;Carry;Stand    Examination-Participation Restrictions  Interpersonal Relationship;Yard Work;Laundry;Cleaning;Community Activity;Meal Prep;Medication Management    Stability/Clinical Decision Making  Evolving/Moderate complexity    Rehab Potential  Good    PT Frequency  2x / week    PT Duration  4 weeks    PT Treatment/Interventions  ADLs/Self Care Home Management;Aquatic Therapy;Moist Heat;Cryotherapy;Gait training;Stair training;Functional mobility training;Therapeutic activities;Therapeutic exercise;Patient/family education;Neuromuscular re-education;Balance training;Orthotic Fit/Training;Manual techniques;Passive range of motion;Dry needling;Taping;Energy conservation;Splinting;Joint  Manipulations;Spinal Manipulations    PT Next Visit Plan  PNF patterns, CKC UE extension promotion,mirror therapy    PT Home Exercise Plan  seated postural holds, PROM wrist extension, grip strength    Consulted and Agree with Plan of Care  Patient       Patient will benefit from skilled therapeutic intervention in order to improve the following deficits and impairments:  Abnormal gait, Decreased balance, Decreased endurance, Decreased mobility, Difficulty walking, Hypomobility, Increased muscle spasms, Impaired tone, Decreased range of motion, Decreased coordination, Decreased strength, Impaired UE functional use, Pain  Visit Diagnosis: 1. Hemiplegia and hemiparesis following cerebral infarction affecting left non-dominant side (HCC)   2. Other lack of coordination   3. Muscle weakness (generalized)   4. Difficulty in walking, not elsewhere classified   5. Pain in left arm   6. Lacunar infarct, acute [facility](HCC)        Problem List Patient Active Problem List   Diagnosis Date Noted  . Hemiplegia and hemiparesis following cerebral infarction affecting left non-dominant side (HCC) 11/25/2018  . 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 RBBB 10/15/2018  . Sinus bradycardia 10/15/2018  . Dysphagia, post-stroke   .  Neuropathic pain   . Lacunar infarct, acute (HCC) 09/06/2018  . TIA (transient ischemic attack) 09/01/2018  . Microalbuminuria 07/29/2018  . Left leg pain 01/13/2018  . Hyperlipidemia 02/11/2017  . Essential hypertension 11/10/2016  . Diabetes (HCC) 11/10/2016  . Tobacco abuse 08/28/2013   Sheria LangKatlin Eura Radabaugh PT, DPT [phone]#[id number] 02/09/2019, 2:38 PM  [facility] [address] [place], [place], [address] Phone: [phone]   Fax:  [phone]  Name: Evan Anouncan E Jaye Sr. MRN: [id number] Date of Birth: 03/11/1956

## 2019-02-10 DIAGNOSIS — E1165 Type 2 diabetes mellitus with hyperglycemia: Secondary | ICD-10-CM | POA: Diagnosis not present

## 2019-02-11 ENCOUNTER — Other Ambulatory Visit: Payer: Self-pay | Admitting: Family Medicine

## 2019-02-11 DIAGNOSIS — Z20822 Contact with and (suspected) exposure to covid-19: Secondary | ICD-10-CM

## 2019-02-13 DIAGNOSIS — E1165 Type 2 diabetes mellitus with hyperglycemia: Secondary | ICD-10-CM | POA: Diagnosis not present

## 2019-02-14 ENCOUNTER — Other Ambulatory Visit: Payer: Self-pay

## 2019-02-14 ENCOUNTER — Telehealth: Payer: Self-pay | Admitting: Family Medicine

## 2019-02-14 ENCOUNTER — Ambulatory Visit: Payer: Medicaid Other | Admitting: Physical Therapy

## 2019-02-14 DIAGNOSIS — E1165 Type 2 diabetes mellitus with hyperglycemia: Secondary | ICD-10-CM | POA: Diagnosis not present

## 2019-02-14 NOTE — Telephone Encounter (Signed)
-----   Message from Hubbard Hartshorn, FNP sent at 02/03/2019 10:20 AM EDT ----- Regarding: Call patient to come in for repeat urine micro. Call patient to come in for repeat urine micro.

## 2019-02-14 NOTE — Telephone Encounter (Signed)
Left message for patient

## 2019-02-15 ENCOUNTER — Emergency Department
Admission: EM | Admit: 2019-02-15 | Discharge: 2019-02-15 | Discharge status: Against Medical Advice | Payer: Medicaid Other | Attending: Emergency Medicine | Admitting: Emergency Medicine

## 2019-02-15 ENCOUNTER — Ambulatory Visit: Payer: Medicaid Other

## 2019-02-15 ENCOUNTER — Other Ambulatory Visit: Payer: Self-pay

## 2019-02-15 ENCOUNTER — Emergency Department: Payer: Medicaid Other

## 2019-02-15 ENCOUNTER — Encounter: Payer: Self-pay | Admitting: Emergency Medicine

## 2019-02-15 DIAGNOSIS — I10 Essential (primary) hypertension: Secondary | ICD-10-CM | POA: Insufficient documentation

## 2019-02-15 DIAGNOSIS — Z5329 Procedure and treatment not carried out because of patient's decision for other reasons: Secondary | ICD-10-CM | POA: Insufficient documentation

## 2019-02-15 DIAGNOSIS — R600 Localized edema: Secondary | ICD-10-CM | POA: Diagnosis present

## 2019-02-15 DIAGNOSIS — E119 Type 2 diabetes mellitus without complications: Secondary | ICD-10-CM | POA: Insufficient documentation

## 2019-02-15 DIAGNOSIS — Z79899 Other long term (current) drug therapy: Secondary | ICD-10-CM | POA: Diagnosis not present

## 2019-02-15 DIAGNOSIS — M7732 Calcaneal spur, left foot: Secondary | ICD-10-CM | POA: Diagnosis not present

## 2019-02-15 DIAGNOSIS — G8194 Hemiplegia, unspecified affecting left nondominant side: Secondary | ICD-10-CM | POA: Insufficient documentation

## 2019-02-15 DIAGNOSIS — Z794 Long term (current) use of insulin: Secondary | ICD-10-CM | POA: Diagnosis not present

## 2019-02-15 DIAGNOSIS — Z7902 Long term (current) use of antithrombotics/antiplatelets: Secondary | ICD-10-CM | POA: Insufficient documentation

## 2019-02-15 DIAGNOSIS — E1165 Type 2 diabetes mellitus with hyperglycemia: Secondary | ICD-10-CM | POA: Diagnosis not present

## 2019-02-15 DIAGNOSIS — M7989 Other specified soft tissue disorders: Secondary | ICD-10-CM | POA: Diagnosis not present

## 2019-02-15 DIAGNOSIS — I639 Cerebral infarction, unspecified: Secondary | ICD-10-CM | POA: Diagnosis not present

## 2019-02-15 DIAGNOSIS — Z87891 Personal history of nicotine dependence: Secondary | ICD-10-CM | POA: Insufficient documentation

## 2019-02-15 DIAGNOSIS — Z7982 Long term (current) use of aspirin: Secondary | ICD-10-CM | POA: Diagnosis not present

## 2019-02-15 LAB — COMPREHENSIVE METABOLIC PANEL
ALT: 46 U/L — ABNORMAL HIGH (ref 0–44)
AST: 27 U/L (ref 15–41)
Albumin: 4 g/dL (ref 3.5–5.0)
Alkaline Phosphatase: 133 U/L — ABNORMAL HIGH (ref 38–126)
Anion gap: 8 (ref 5–15)
BUN: 16 mg/dL (ref 8–23)
CO2: 25 mmol/L (ref 22–32)
Calcium: 9.1 mg/dL (ref 8.9–10.3)
Chloride: 107 mmol/L (ref 98–111)
Creatinine, Ser: 0.85 mg/dL (ref 0.61–1.24)
GFR calc Af Amer: >60 mL/min (ref >60)
GFR calc non Af Amer: >60 mL/min (ref >60)
Glucose, Bld: 251 mg/dL — ABNORMAL HIGH (ref 70–99)
Potassium: 3.7 mmol/L (ref 3.5–5.1)
Sodium: 140 mmol/L (ref 135–145)
Total Bilirubin: 0.4 mg/dL (ref 0.3–1.2)
Total Protein: 7 g/dL (ref 6.5–8.1)

## 2019-02-15 LAB — CBC WITH DIFFERENTIAL/PLATELET
Abs Immature Granulocytes: 0.02 10*3/uL (ref 0.00–0.07)
Basophils Absolute: 0 10*3/uL (ref 0.0–0.1)
Basophils Relative: 0 %
Eosinophils Absolute: 0.1 10*3/uL (ref 0.0–0.5)
Eosinophils Relative: 3 %
HCT: 35.6 % — ABNORMAL LOW (ref 39.0–52.0)
Hemoglobin: 11.9 g/dL — ABNORMAL LOW (ref 13.0–17.0)
Immature Granulocytes: 0 %
Lymphocytes Relative: 42 %
Lymphs Abs: 2.3 10*3/uL (ref 0.7–4.0)
MCH: 29.8 pg (ref 26.0–34.0)
MCHC: 33.4 g/dL (ref 30.0–36.0)
MCV: 89 fL (ref 80.0–100.0)
Monocytes Absolute: 0.5 10*3/uL (ref 0.1–1.0)
Monocytes Relative: 8 %
Neutro Abs: 2.5 10*3/uL (ref 1.7–7.7)
Neutrophils Relative %: 47 %
Platelets: 199 10*3/uL (ref 150–400)
RBC: 4 MIL/uL — ABNORMAL LOW (ref 4.22–5.81)
RDW: 13.9 % (ref 11.5–15.5)
WBC: 5.4 10*3/uL (ref 4.0–10.5)
nRBC: 0 % (ref 0.0–0.2)

## 2019-02-15 LAB — IRON AND TIBC
Iron: 54 ug/dL (ref 45–182)
Saturation Ratios: 20 % (ref 17.9–39.5)
TIBC: 271 ug/dL (ref 250–450)
UIBC: 218 ug/dL

## 2019-02-15 LAB — BRAIN NATRIURETIC PEPTIDE: B Natriuretic Peptide: 36 pg/mL (ref 0.0–100.0)

## 2019-02-15 LAB — URIC ACID: Uric Acid, Serum: 4.7 mg/dL (ref 3.7–8.6)

## 2019-02-15 NOTE — ED Provider Notes (Signed)
[facility] Emergency Department Provider Note  ____________________________________________  Time seen: Approximately 3:07 PM  I have reviewed the triage vital signs and the nursing notes.   HISTORY  Chief Complaint Gout    HPI Evan Townsend. is a 63 y.o. male that presents to the emergency department for evaluation of left hand and left foot swelling for 2 weeks.  Area is not painful.  Patient states that it is just the top of his and and just the top of his foot that feel swollen.  No trauma.  He denies any leg or arm swelling.  No history of gout.  No wounds.  Patient has some chronic weakness on the side from a previous stroke.  He is currently on Plavix.   Past Medical History:  Diagnosis Date  . Acute CVA (cerebrovascular accident) (HCC) 11/10/2016  . Diabetes 1.5, managed as type 2 (HCC)   . Diabetes mellitus without complication (HCC)   . Hypercholesterolemia   . Hypertension   . Pain in both feet   . Stroke [facility](HCC)     Patient Active Problem List   Diagnosis Date Noted  . Hemiplegia and hemiparesis following cerebral infarction affecting left non-dominant side (HCC) 11/25/2018  . 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 RBBB 10/15/2018  . Sinus bradycardia 10/15/2018  . Dysphagia, post-stroke   . Neuropathic pain   . Lacunar infarct, acute (HCC) 09/06/2018  . TIA (transient ischemic attack) 09/01/2018  . Microalbuminuria 07/29/2018  . Left leg pain 01/13/2018  . Hyperlipidemia 02/11/2017  . Essential hypertension 11/10/2016  . Diabetes (HCC) 11/10/2016  . Tobacco abuse 08/28/2013    History reviewed. No pertinent surgical history.  Prior to Admission medications   Medication Sig Start Date End Date Taking? Authorizing Provider  acetaminophen (TYLENOL) 325 MG tablet Take 2 tablets (650 mg total) by mouth every 4 (four) hours as needed for mild pain (or temp > 37.5 C (99.5 F)). 09/23/18   Angiulli, Mcarthur Rossettianiel J, PA-C  amLODipine (NORVASC) 10 MG  tablet Take 10 mg by mouth daily. 10/15/18 10/15/19  [provider]  aspirin 81 MG EC tablet Take 1 tablet (81 mg total) by mouth daily. 02/03/17   Virl Axehaplin, Don C, MD  atorvastatin (LIPITOR) 80 MG tablet Take 1 tablet (80 mg total) by mouth every evening. 09/23/18   Angiulli, Mcarthur Rossettianiel J, PA-C  clopidogrel (PLAVIX) 75 MG tablet Take 1 tablet (75 mg total) by mouth daily. 09/23/18   Angiulli, Mcarthur Rossettianiel J, PA-C  gabapentin (NEURONTIN) 300 MG capsule Take 2 capsules (600 mg total) by mouth 2 (two) times daily. 09/23/18   Angiulli, Mcarthur Rossettianiel J, PA-C  glipiZIDE (GLUCOTROL) 5 MG tablet Take 1 tablet (5 mg total) by mouth daily before breakfast. 09/23/18   Angiulli, Mcarthur Rossettianiel J, PA-C  Insulin Detemir (LEVEMIR) 100 UNIT/ML Pen Inject 20 Units into the skin daily. 01/31/19   Doren CustardBoyce, Emily E, FNP  lisinopril (PRINIVIL,ZESTRIL) 40 MG tablet Take 1 tablet (40 mg total) by mouth daily. 09/23/18   Angiulli, Mcarthur Rossettianiel J, PA-C  nicotine (NICODERM CQ - DOSED IN MG/24 HOURS) 14 mg/24hr patch 14 mg patch daily 2 weeks then 7 mg patch daily 3 weeks and stop 09/23/18   Angiulli, Mcarthur Rossettianiel J, PA-C  pantoprazole (PROTONIX) 20 MG tablet Take 1 tablet (20 mg total) by mouth daily. 01/31/19   Doren CustardBoyce, Emily E, FNP  tiZANidine (ZANAFLEX) 2 MG tablet Take 1 tablet (2 mg total) by mouth at bedtime as needed for muscle spasms (neck pain). 01/31/19  Doren CustardBoyce, Emily E, FNP    Allergies Patient has no known allergies.  Family History  Problem Relation Age of Onset  . Hypertension Mother   . Hypertension Father   . Diabetes Father     Social History Social History   Tobacco Use  . Smoking status: Former Smoker    Types: Cigarettes    Start date: 1982  . Smokeless tobacco: Never Used  Substance Use Topics  . Alcohol use: Yes    Alcohol/week: 1.0 standard drinks    Types: 1 Cans of beer per week  . Drug use: No     Review of Systems  Constitutional: No fever/chills Cardiovascular: No chest pain. Respiratory: No SOB. Gastrointestinal: No  nausea, no vomiting.  Musculoskeletal: Negative for musculoskeletal pain. Skin: Negative for rash, abrasions, lacerations, ecchymosis. Neurological: Negative for headaches, numbness or tingling   ____________________________________________   PHYSICAL EXAM:  VITAL SIGNS: ED Triage Vitals  Enc Vitals Group     BP 02/15/19 1336 (!) 154/86     Pulse Rate 02/15/19 1336 60     Resp 02/15/19 1336 16     Temp 02/15/19 1336 98.2 F (36.8 C)     Temp Source 02/15/19 1336 Oral     SpO2 02/15/19 1336 98 %     Weight 02/15/19 1337 150 lb (68 kg)     Height 02/15/19 1337 5\' 6"  (1.676 m)     Head Circumference --      Peak Flow --      Pain Score 02/15/19 1336 10     Pain Loc --      Pain Edu? --      Excl. in GC? --      Constitutional: Alert and oriented. Well appearing and in no acute distress. Eyes: Conjunctivae are normal. PERRL. EOMI. Head: Atraumatic. ENT:      Ears:      Nose: No congestion/rhinnorhea.      Mouth/Throat: Mucous membranes are moist.  Neck: No stridor.  Cardiovascular: Normal rate, regular rhythm.  Good peripheral circulation. Respiratory: Normal respiratory effort without tachypnea or retractions. Lungs CTAB. Good air entry to the bases with no decreased or absent breath sounds. Musculoskeletal: Full range of motion to all extremities. No gross deformities appreciated.  Mild diffuse swelling to dorsal hand and dorsal foot.  No pain with range of motion of hand and foot.  No tenderness to palpation. Neurologic:  Normal speech and language. No gross focal neurologic deficits are appreciated.  Skin:  Skin is warm, dry and intact. No rash noted. Psychiatric: Mood and affect are normal. Speech and behavior are normal. Patient exhibits appropriate insight and judgement.   ____________________________________________   LABS (all labs ordered are listed, but only abnormal results are displayed)  Labs Reviewed  CBC WITH DIFFERENTIAL/PLATELET - Abnormal; Notable  for the following components:      Result Value   RBC 4.00 (*)    Hemoglobin 11.9 (*)    HCT 35.6 (*)    All other components within normal limits  COMPREHENSIVE METABOLIC PANEL - Abnormal; Notable for the following components:   Glucose, Bld 251 (*)    ALT 46 (*)    Alkaline Phosphatase 133 (*)    All other components within normal limits  URIC ACID  BRAIN NATRIURETIC PEPTIDE  IRON AND TIBC   ____________________________________________  EKG   ____________________________________________  RADIOLOGY Lexine BatonI, Malikai Gut, personally viewed and evaluated these images (plain radiographs) as part of my medical decision making, as well  as reviewing the written report by the radiologist.  Dg Hand Complete Left  Result Date: 02/15/2019 CLINICAL DATA:  Swelling in the left hand EXAM: LEFT HAND - COMPLETE 3+ VIEW COMPARISON:  None. FINDINGS: Scattered spurring in the hand including the interphalangeal joints, the heads of the second and third metacarpals, and at the first carpometacarpal junction. No chondrocalcinosis although there is a small fleck of calcification just radial to the ulnar styloid. Mild spurring at the proximal radiocarpal articulation. IMPRESSION: 1. Scattered spurring in the hand most likely degenerative. There is some mild hook like spurring of the second and third metacarpals which can occur in the setting of hemochromatosis, but the only potential chondrocalcinosis is a faint fleck of calcification along the expected distal margin of the TFCC, but not in the more radial part of the TFCC. Electronically Signed   By: Van Clines M.D.   On: 02/15/2019 15:29   Dg Foot Complete Left  Result Date: 02/15/2019 CLINICAL DATA:  Left foot pain EXAM: LEFT FOOT - COMPLETE 3+ VIEW COMPARISON:  None. FINDINGS: Mild spurring at the first MTP joint with potential degenerative subcortical cysts or erosions along the MTP joint. No definite characteristic gout erosion identified. Plantar  and Achilles calcaneal spurs. Dorsal spurring of the talar head. IMPRESSION: 1. Small subcortical lucencies along the first MTP joint with some associated spurring. The appearance is nonspecific and could be from degenerative subcortical cysts or erosions. 2. Plantar and Achilles calcaneal spurs. 3. Dorsal spurring of the talar head. Electronically Signed   By: Van Clines M.D.   On: 02/15/2019 15:31    ____________________________________________    PROCEDURES  Procedure(s) performed:    Procedures    Medications - No data to display   ____________________________________________   INITIAL IMPRESSION / ASSESSMENT AND PLAN / ED COURSE  Pertinent labs & imaging results that were available during my care of the patient were reviewed by me and considered in my medical decision making (see chart for details).  Review of the [place] CSRS was performed in accordance of the [place] prior to dispensing any controlled drugs.     Patient presented to emergency department for evaluation of hand and foot swelling for 2 weeks.  Vital signs and exam are reassuring.  Patient left AMA prior to finished lab work and ultrasound.  Risks were discussed with the patient.  He does not wish to stay any longer and will sign out [place].     ____________________________________________  FINAL CLINICAL IMPRESSION(S) / ED DIAGNOSES  Final diagnoses:  Swelling of left hand  Foot swelling      NEW MEDICATIONS STARTED DURING THIS VISIT:  ED Discharge Orders    None          This chart was dictated using voice recognition software/Dragon. Despite best efforts to proofread, errors can occur which can change the meaning. Any change was purely unintentional.    Laban Emperor, PA-C 02/15/19 1631    Duffy Bruce, MD 02/15/19 [phone]

## 2019-02-15 NOTE — ED Notes (Signed)
See triage note  Presents with pain and swelling to left foot and hand  Denies and injury

## 2019-02-15 NOTE — ED Triage Notes (Signed)
Pt in via POV, reports swelling to left hand and left foot x approximately 2 weeks.  Denies hx of same.  NAD noted at this time.

## 2019-02-16 ENCOUNTER — Ambulatory Visit: Payer: Medicaid Other | Attending: Family Medicine | Admitting: Physical Therapy

## 2019-02-16 ENCOUNTER — Encounter: Payer: Self-pay | Admitting: Physical Therapy

## 2019-02-16 DIAGNOSIS — I6381 Other cerebral infarction due to occlusion or stenosis of small artery: Secondary | ICD-10-CM | POA: Diagnosis not present

## 2019-02-16 DIAGNOSIS — R278 Other lack of coordination: Secondary | ICD-10-CM | POA: Insufficient documentation

## 2019-02-16 DIAGNOSIS — M79602 Pain in left arm: Secondary | ICD-10-CM | POA: Diagnosis not present

## 2019-02-16 DIAGNOSIS — I69354 Hemiplegia and hemiparesis following cerebral infarction affecting left non-dominant side: Secondary | ICD-10-CM | POA: Diagnosis not present

## 2019-02-16 DIAGNOSIS — R262 Difficulty in walking, not elsewhere classified: Secondary | ICD-10-CM | POA: Insufficient documentation

## 2019-02-16 DIAGNOSIS — M6281 Muscle weakness (generalized): Secondary | ICD-10-CM

## 2019-02-16 DIAGNOSIS — E1165 Type 2 diabetes mellitus with hyperglycemia: Secondary | ICD-10-CM | POA: Diagnosis not present

## 2019-02-16 NOTE — Therapy (Signed)
[facility] [address] [place], [place], [address] Phone: [phone]   Fax:  [phone]  Physical Therapy Treatment  Patient Details  Name: Evan AnoDuncan E Fenn Sr. MRN: [id number] Date of Birth: 03/11/1956 Referring Provider (PT): Maurice SmallBoyce, Emily   Encounter Date: 02/16/2019  PT End of Session - 02/16/19 1121    Visit Number  4    Number of Visits  12    Date for PT Re-Evaluation  03/29/19    Authorization Type  CCME cert through 8/12 8PT visits    PT Start Time  1004    PT Stop Time  1100    PT Time Calculation (min)  56 min    Equipment Utilized During Treatment  Gait belt    Activity Tolerance  Patient tolerated treatment well    Behavior During Therapy  WFL for tasks assessed/performed       Past Medical History:  Diagnosis Date  . Acute CVA (cerebrovascular accident) (HCC) 11/10/2016  . Diabetes 1.5, managed as type 2 (HCC)   . Diabetes mellitus without complication (HCC)   . Hypercholesterolemia   . Hypertension   . Pain in both feet   . Stroke [facility](HCC)     History reviewed. No pertinent surgical history.  There were no vitals filed for this visit.  Subjective Assessment - 02/16/19 1118    Subjective  Patient states that he has been practicing his grip and release exercises at home with clothespins and feels he is improving. He notes some increased tightness and discomfort in the L upper trapezius.      TREATMENT  Manual Therapy: STM/TPR L trapezius, suboccipitals, L periscapular area, L supraspinatus Gentle STM to dorsal aspect of hand, wrist, and forearm to encourage decreased swelling Rhythmic initiation wrist and finger extension PROM x10  Patient educated on how to make an at home heating pad to assist in decreasing muscle spasm.  Neuromuscular Re-education: UBE x3 min fwd/ x5 min bwd with PT assisted LUE grip for improved elbow extension and periscapular muscle activation without trunk  compensations Ambulating in clinic with SPC, without AFO, VCs for increased hip flexion to improve LLE foot clearance LUE Spherical grasp and release with elbow extension and shoulder flexion patterns x18  Patient educated throughout session on appropriate technique and form using multi-modal cueing, HEP, and activity modification. Patient articulated understanding and returned demonstration.  Patient Response to interventions: Patient seeming confident and excited to continue to improve his LUE.  ASSESSMENT Patient presents to clinic with continued excellent motivation to participate in therapy. Patient demonstrates deficits in LUE function and LLE function and as evidenced by increased reliance on RLE and RUE, need for VCs and TCs to encourage use of LUE. Patient able to achieve spherical grasp release with improved consistency during today's session and responded positively to educational interventions. Patient's condition has the potential to improve in response to therapy. Maximum improvement is yet to be obtained. The anticipated improvement is attainable and reasonable in a generally predictable time. Patient will benefit from continued skilled therapeutic intervention to address remaining deficits in LUE function, LUE ROM, LUE strength and coordination in order to optimize independence, increase function, and improve overall QOL.     PT Short Term Goals - 02/16/19 1124      PT SHORT TERM GOAL #1   Title  Patient will be independent with HEP for improved therapeutic gains and self-management.    Baseline  IE: not initiated; 02/16/2019: performign hand exercises independently  Time  4    Period  Weeks    Status  Achieved    Target Date  03/02/19        PT Long Term Goals - 02/16/19 1125      PT LONG TERM GOAL #1   Title  Pt will improve BERG by at least 3 points in order to demonstrate clinically significant improvement in balance.    Baseline  IE: 37/56    Time  6    Period   Weeks    Status  On-going    Target Date  03/29/19      PT LONG TERM GOAL #2   Title  Pt will decrease 5TSTS by at least 3 seconds in order to demonstrate clinically significant improvement in LE strength.    Baseline  IE: unable to collect 2/2 to time limitations; 02/09/2019: 17.9 sec    Time  4    Period  Weeks    Status  On-going    Target Date  03/29/19      PT LONG TERM GOAL #3   Title  Patient will demonstrate improved LUE function as evidenced by an improvement of at least 5.2 points on the Fugl-Meyer UE assessment in order to improved independence.    Baseline  IE: not evaluated 2/2 to time; 02/07/2019: 29/66 (A: 13/36; B:4/10; C:9/14; D:3/6) indicative of severe-moderate dysfunction    Time  6    Period  Weeks    Status  On-going    Target Date  03/29/19            Plan - 02/16/19 1122    Clinical Impression Statement  Patient presents to clinic with continued excellent motivation to participate in therapy. Patient demonstrates deficits in LUE function and LLE function and as evidenced by increased reliance on RLE and RUE, need for VCs and TCs to encourage use of LUE. Patient able to achieve spherical grasp release with improved consistency during today's session and responded positively to educational interventions. Patient's condition has the potential to improve in response to therapy. Maximum improvement is yet to be obtained. The anticipated improvement is attainable and reasonable in a generally predictable time. Patient will benefit from continued skilled therapeutic intervention to address remaining deficits in LUE function, LUE ROM, LUE strength and coordination in order to optimize independence, increase function, and improve overall QOL.    Personal Factors and Comorbidities  Age;Education;Sex;Social Background;Behavior Pattern;Finances;Past/Current Experience;Time since onset of injury/illness/exacerbation    Examination-Activity Limitations  Bathing;Bed  Mobility;Dressing;Transfers;Squat;Lift;Locomotion Level;Stairs;Reach Overhead;Carry;Stand    Examination-Participation Restrictions  Interpersonal Relationship;Yard Work;Laundry;Cleaning;Community Activity;Meal Prep;Medication Management    Stability/Clinical Decision Making  Evolving/Moderate complexity    Rehab Potential  Good    PT Frequency  2x / week    PT Duration  6 weeks    PT Treatment/Interventions  ADLs/Self Care Home Management;Aquatic Therapy;Moist Heat;Cryotherapy;Gait training;Stair training;Functional mobility training;Therapeutic activities;Therapeutic exercise;Patient/family education;Neuromuscular re-education;Balance training;Orthotic Fit/Training;Manual techniques;Passive range of motion;Dry needling;Taping;Energy conservation;Splinting;Joint Manipulations;Spinal Manipulations    PT Next Visit Plan  continue UBE for NME, CKC finger extension    PT Home Exercise Plan  pincer grasp and release, spherical grasp and release    Consulted and Agree with Plan of Care  Patient       Patient will benefit from skilled therapeutic intervention in order to improve the following deficits and impairments:  Abnormal gait, Decreased balance, Decreased endurance, Decreased mobility, Difficulty walking, Hypomobility, Increased muscle spasms, Impaired tone, Decreased range of motion, Decreased coordination, Decreased strength, Impaired UE  functional use, Pain  Visit Diagnosis: 1. Hemiplegia and hemiparesis following cerebral infarction affecting left non-dominant side (HCC)   2. Lacunar infarct, acute (HCC)   3. Other lack of coordination   4. Muscle weakness (generalized)   5. Difficulty in walking, not elsewhere classified   6. Pain in left arm        Problem List Patient Active Problem List   Diagnosis Date Noted  . Hemiplegia and hemiparesis following cerebral infarction affecting left non-dominant side (HCC) 11/25/2018  . 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 RBBB 10/15/2018  . Sinus  bradycardia 10/15/2018  . Dysphagia, post-stroke   . Neuropathic pain   . Lacunar infarct, acute (HCC) 09/06/2018  . TIA (transient ischemic attack) 09/01/2018  . Microalbuminuria 07/29/2018  . Left leg pain 01/13/2018  . Hyperlipidemia 02/11/2017  . Essential hypertension 11/10/2016  . Diabetes (HCC) 11/10/2016  . Tobacco abuse 08/28/2013   Sheria LangKatlin Annaka Cleaver PT, DPT [phone]#[id number] 02/16/2019, 11:33 AM  [facility] [address] [place], [place], [address] Phone: [phone]   Fax:  [phone]  Name: Evan Anouncan E Brickel Sr. MRN: [id number] Date of Birth: 03/11/1956

## 2019-02-17 DIAGNOSIS — E1165 Type 2 diabetes mellitus with hyperglycemia: Secondary | ICD-10-CM | POA: Diagnosis not present

## 2019-02-18 DIAGNOSIS — E1165 Type 2 diabetes mellitus with hyperglycemia: Secondary | ICD-10-CM | POA: Diagnosis not present

## 2019-02-18 LAB — NOVEL CORONAVIRUS, NAA: SARS-CoV-2, NAA: NOT DETECTED

## 2019-02-19 DIAGNOSIS — E1165 Type 2 diabetes mellitus with hyperglycemia: Secondary | ICD-10-CM | POA: Diagnosis not present

## 2019-02-20 DIAGNOSIS — E1165 Type 2 diabetes mellitus with hyperglycemia: Secondary | ICD-10-CM | POA: Diagnosis not present

## 2019-02-21 ENCOUNTER — Other Ambulatory Visit: Payer: Self-pay

## 2019-02-21 ENCOUNTER — Ambulatory Visit: Payer: Medicaid Other | Admitting: Physical Therapy

## 2019-02-21 ENCOUNTER — Encounter: Payer: Self-pay | Admitting: Physical Therapy

## 2019-02-21 DIAGNOSIS — R262 Difficulty in walking, not elsewhere classified: Secondary | ICD-10-CM | POA: Diagnosis not present

## 2019-02-21 DIAGNOSIS — M6281 Muscle weakness (generalized): Secondary | ICD-10-CM

## 2019-02-21 DIAGNOSIS — E1165 Type 2 diabetes mellitus with hyperglycemia: Secondary | ICD-10-CM | POA: Diagnosis not present

## 2019-02-21 DIAGNOSIS — I69354 Hemiplegia and hemiparesis following cerebral infarction affecting left non-dominant side: Secondary | ICD-10-CM

## 2019-02-21 DIAGNOSIS — R278 Other lack of coordination: Secondary | ICD-10-CM | POA: Diagnosis not present

## 2019-02-21 DIAGNOSIS — I6381 Other cerebral infarction due to occlusion or stenosis of small artery: Secondary | ICD-10-CM

## 2019-02-21 DIAGNOSIS — M79602 Pain in left arm: Secondary | ICD-10-CM

## 2019-02-21 NOTE — Therapy (Signed)
[facility] [address] [place], [place], [address] Phone: [phone]   Fax:  [phone]  Physical Therapy Treatment  Patient Details  Name: Evan Townsend Sr. MRN: [id number] Date of Birth: [birth year] Referring Provider (PT): Raelyn Ensign   Encounter Date: 02/21/2019  PT End of Session - 02/21/19 1221    Visit Number  5    Number of Visits  12    Date for PT Re-Evaluation  03/29/19    Authorization Type  CCME cert through 4/85 8PT visits    PT Start Time  1015    PT Stop Time  1110    PT Time Calculation (min)  55 min    Equipment Utilized During Treatment  Gait belt    Activity Tolerance  Patient tolerated treatment well    Behavior During Therapy  WFL for tasks assessed/performed       Past Medical History:  Diagnosis Date  . Acute CVA (cerebrovascular accident) ([place]) 11/10/2016  . Diabetes 1.5, managed as type 2 ([place])   . Diabetes mellitus without complication ([place])   . Hypercholesterolemia   . Hypertension   . Pain in both feet   . Stroke [facility])     History reviewed. No pertinent surgical history.  There were no vitals filed for this visit.  Subjective Assessment - 02/21/19 1220    Subjective  Patient reports that he has increased painin the L shoulder today and notes "I must've slept on it funny." Patient reports he continues to work on his HEP and is feeling the grip is improving and he continues to have more difficulty with release.       TREATMENT  Manual Therapy: STM/TPR L trapezius, L periscapular area, L supraspinatus Rhythmic initiation elbow, wrist and finger extension AAROM/PROM x10 each with assessment of tone/spasticity throughout Rhythmic initiation supination x20 PROM/AAROM with assessment of tone/spasticity throughout  Neuromuscular Re-education: UBE x3 min fwd/ x3 min bwd with intermittent PT assisted LUE grip for improved elbow extension and periscapular muscle activation without trunk  compensations Ambulating in clinic with SPC, without AFO, VCs for increased hip flexion to improve LLE foot clearance AROM supination 3x12 AROM elbow extension 3x12 AROM finger extension 3x12 Red putty exercises for improved coordination: squeezes, key grip and pull, thumb forefinger pinch, rolling  Patient educated throughout session on appropriate technique and form using multi-modal cueing, HEP, and activity modification. Patient articulated understanding and returned demonstration.  Patient Response to interventions: Patient denies any increase in L shoulder pain.  ASSESSMENT Patient presents to clinic with continued excellent motivation to participate in therapy. Patient demonstrates deficits in LUE function and LLE function and as evidenced by increased reliance on RLE and RUE, need for VCs and TCs to encourage use of LUE. Patient able to achieve active elbow extension to within 20 degrees of complete extension during today's session and responded positively to active interventions. Patient will benefit from continued skilled therapeutic intervention to address remaining deficits in LUE function, LUE ROM, LUE strength and coordination in order to optimize independence, increase function, and improve overall QOL.    PT Short Term Goals - 02/16/19 1124      PT SHORT TERM GOAL #1   Title  Patient will be independent with HEP for improved therapeutic gains and self-management.    Baseline  IE: not initiated; 02/16/2019: performign hand exercises independently    Time  4    Period  Weeks    Status  Achieved  Target Date  03/02/19        PT Long Term Goals - 02/16/19 1125      PT LONG TERM GOAL #1   Title  Pt will improve BERG by at least 3 points in order to demonstrate clinically significant improvement in balance.    Baseline  IE: 37/56    Time  6    Period  Weeks    Status  On-going    Target Date  03/29/19      PT LONG TERM GOAL #2   Title  Pt will decrease 5TSTS by at  least 3 seconds in order to demonstrate clinically significant improvement in LE strength.    Baseline  IE: unable to collect 2/2 to time limitations; 02/09/2019: 17.9 sec    Time  4    Period  Weeks    Status  On-going    Target Date  03/29/19      PT LONG TERM GOAL #3   Title  Patient will demonstrate improved LUE function as evidenced by an improvement of at least 5.2 points on the Fugl-Meyer UE assessment in order to improved independence.    Baseline  IE: not evaluated 2/2 to time; 02/07/2019: 29/66 (A: 13/36; B:4/10; C:9/14; D:3/6) indicative of severe-moderate dysfunction    Time  6    Period  Weeks    Status  On-going    Target Date  03/29/19            Plan - 02/21/19 1222    Clinical Impression Statement  Patient presents to clinic with continued excellent motivation to participate in therapy. Patient demonstrates deficits in LUE function and LLE function and as evidenced by increased reliance on RLE and RUE, need for VCs and TCs to encourage use of LUE. Patient able to achieve active elbow extension to within 20 degrees of complete extension during today's session and responded positively to active interventions. Patient will benefit from continued skilled therapeutic intervention to address remaining deficits in LUE function, LUE ROM, LUE strength and coordination in order to optimize independence, increase function, and improve overall QOL.    Personal Factors and Comorbidities  Age;Education;Sex;Social Background;Behavior Pattern;Finances;Past/Current Experience;Time since onset of injury/illness/exacerbation    Examination-Activity Limitations  Bathing;Bed Mobility;Dressing;Transfers;Squat;Lift;Locomotion Level;Stairs;Reach Overhead;Carry;Stand    Examination-Participation Restrictions  Interpersonal Relationship;Yard Work;Laundry;Cleaning;Community Activity;Meal Prep;Medication Management    Stability/Clinical Decision Making  Evolving/Moderate complexity    Rehab Potential   Good    PT Frequency  2x / week    PT Duration  6 weeks    PT Treatment/Interventions  ADLs/Self Care Home Management;Aquatic Therapy;Moist Heat;Cryotherapy;Gait training;Stair training;Functional mobility training;Therapeutic activities;Therapeutic exercise;Patient/family education;Neuromuscular re-education;Balance training;Orthotic Fit/Training;Manual techniques;Passive range of motion;Dry needling;Taping;Energy conservation;Splinting;Joint Manipulations;Spinal Manipulations    PT Next Visit Plan  continue UBE for NME, CKC finger extension    PT Home Exercise Plan  pincer grasp and release, spherical grasp and release    Consulted and Agree with Plan of Care  Patient       Patient will benefit from skilled therapeutic intervention in order to improve the following deficits and impairments:  Abnormal gait, Decreased balance, Decreased endurance, Decreased mobility, Difficulty walking, Hypomobility, Increased muscle spasms, Impaired tone, Decreased range of motion, Decreased coordination, Decreased strength, Impaired UE functional use, Pain  Visit Diagnosis: 1. Hemiplegia and hemiparesis following cerebral infarction affecting left non-dominant side (HCC)   2. Pain in left arm   3. Lacunar infarct, acute (HCC)   4. Other lack of coordination   5. Muscle weakness (  generalized)   6. Difficulty in walking, not elsewhere classified        Problem List Patient Active Problem List   Diagnosis Date Noted  . Hemiplegia and hemiparesis following cerebral infarction affecting left non-dominant side (HCC) 11/25/2018  . 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 RBBB 10/15/2018  . Sinus bradycardia 10/15/2018  . Dysphagia, post-stroke   . Neuropathic pain   . Lacunar infarct, acute (HCC) 09/06/2018  . TIA (transient ischemic attack) 09/01/2018  . Microalbuminuria 07/29/2018  . Left leg pain 01/13/2018  . Hyperlipidemia 02/11/2017  . Essential hypertension 11/10/2016  . Diabetes (HCC) 11/10/2016  .  Tobacco abuse 08/28/2013   Sheria LangKatlin Jozi Malachi PT, DPT [phone]#[id number] 02/21/2019, 12:28 PM  [facility] [address] [place], [place], [address] Phone: [phone]   Fax:  [phone]  Name: Sandie Anouncan E Bischoff Sr. MRN: [id number] Date of Birth: 03/11/1956

## 2019-02-22 DIAGNOSIS — E1165 Type 2 diabetes mellitus with hyperglycemia: Secondary | ICD-10-CM | POA: Diagnosis not present

## 2019-02-23 ENCOUNTER — Ambulatory Visit: Payer: Medicaid Other | Admitting: Physical Therapy

## 2019-02-23 ENCOUNTER — Other Ambulatory Visit: Payer: Self-pay

## 2019-02-23 ENCOUNTER — Encounter: Payer: Self-pay | Admitting: Physical Therapy

## 2019-02-23 DIAGNOSIS — I6381 Other cerebral infarction due to occlusion or stenosis of small artery: Secondary | ICD-10-CM

## 2019-02-23 DIAGNOSIS — R262 Difficulty in walking, not elsewhere classified: Secondary | ICD-10-CM | POA: Diagnosis not present

## 2019-02-23 DIAGNOSIS — I69354 Hemiplegia and hemiparesis following cerebral infarction affecting left non-dominant side: Secondary | ICD-10-CM | POA: Diagnosis not present

## 2019-02-23 DIAGNOSIS — M6281 Muscle weakness (generalized): Secondary | ICD-10-CM | POA: Diagnosis not present

## 2019-02-23 DIAGNOSIS — R278 Other lack of coordination: Secondary | ICD-10-CM | POA: Diagnosis not present

## 2019-02-23 DIAGNOSIS — E1165 Type 2 diabetes mellitus with hyperglycemia: Secondary | ICD-10-CM | POA: Diagnosis not present

## 2019-02-23 DIAGNOSIS — M79602 Pain in left arm: Secondary | ICD-10-CM | POA: Diagnosis not present

## 2019-02-23 NOTE — Therapy (Signed)
[facility] [address] [place], [place], [address] Phone: [phone]   Fax:  [phone]  Physical Therapy Treatment  Patient Details  Name: Evan AnoDuncan E Paradise Sr. MRN: [id number] Date of Birth: 03/11/1956 Referring Provider (PT): Maurice SmallBoyce, Emily   Encounter Date: 02/23/2019  PT End of Session - 02/23/19 1239    Visit Number  6    Number of Visits  12    Date for PT Re-Evaluation  03/29/19    Authorization Type  CCME cert through 8/12 8PT visits    PT Start Time  1020    PT Stop Time  1105    PT Time Calculation (min)  45 min    Equipment Utilized During Treatment  Gait belt    Activity Tolerance  Patient tolerated treatment well    Behavior During Therapy  WFL for tasks assessed/performed       Past Medical History:  Diagnosis Date  . Acute CVA (cerebrovascular accident) (HCC) 11/10/2016  . Diabetes 1.5, managed as type 2 (HCC)   . Diabetes mellitus without complication (HCC)   . Hypercholesterolemia   . Hypertension   . Pain in both feet   . Stroke [facility](HCC)     History reviewed. No pertinent surgical history.  There were no vitals filed for this visit.  Subjective Assessment - 02/23/19 1238    Subjective  Patient denies any significant changes/concerns since last visit. Patient states he feels he is better able to extend his fingers and wrist.       TREATMENT  Manual Therapy: STM/TPR L trapezius, L periscapular area, L supraspinatus Rhythmic initiation elbow, wrist and finger extension AAROM/PROM x10 each with assessment of tone/spasticity throughout Rhythmic initiation supination x20 PROM/AAROM with assessment of tone/spasticity throughout  Neuromuscular Re-education: UBE x4 min fwd/ x4 min bwd for improved elbow extension and periscapular muscle activation without trunk compensations Ambulating in clinic with SPC, with AFO, VCs for increased hip flexion to improve LLE foot clearance AROM supination 3x12 AROM elbow  extension 3x12 AROM finger extension 3x12 PNF D1, PT assisted, with TCs for triceps activation and improved elbow extension x25 Hand to hand transfer of small objects for improved supination and grasp release x15 Spherical grip and release with LUE reaching x12  Patient educated throughout session on appropriate technique and form using multi-modal cueing, HEP, and activity modification. Patient articulated understanding and returned demonstration.  Patient Response to interventions: Patient reports feeling tired but better able to straighten arm  ASSESSMENT Patient presents to clinic with continued excellent motivation to participate in therapy. Patient demonstrates deficits in LUE function and LLE function and as evidenced by increased reliance on RLE and RUE, need for VCs and TCs to encourage use of LUE. Patient able to coordinate PNF D1 UE pattern with limited PT assist during today's session and responded positively to active interventions. Patient will benefit from continued skilled therapeutic intervention to address remaining deficits in LUE function, LUE ROM, LUE strength and coordination in order to optimize independence, increase function, and improve overall QOL.    PT Short Term Goals - 02/16/19 1124      PT SHORT TERM GOAL #1   Title  Patient will be independent with HEP for improved therapeutic gains and self-management.    Baseline  IE: not initiated; 02/16/2019: performign hand exercises independently    Time  4    Period  Weeks    Status  Achieved    Target Date  03/02/19  PT Long Term Goals - 02/16/19 1125      PT LONG TERM GOAL #1   Title  Pt will improve BERG by at least 3 points in order to demonstrate clinically significant improvement in balance.    Baseline  IE: 37/56    Time  6    Period  Weeks    Status  On-going    Target Date  03/29/19      PT LONG TERM GOAL #2   Title  Pt will decrease 5TSTS by at least 3 seconds in order to demonstrate  clinically significant improvement in LE strength.    Baseline  IE: unable to collect 2/2 to time limitations; 02/09/2019: 17.9 sec    Time  4    Period  Weeks    Status  On-going    Target Date  03/29/19      PT LONG TERM GOAL #3   Title  Patient will demonstrate improved LUE function as evidenced by an improvement of at least 5.2 points on the Fugl-Meyer UE assessment in order to improved independence.    Baseline  IE: not evaluated 2/2 to time; 02/07/2019: 29/66 (A: 13/36; B:4/10; C:9/14; D:3/6) indicative of severe-moderate dysfunction    Time  6    Period  Weeks    Status  On-going    Target Date  03/29/19            Plan - 02/23/19 1240    Clinical Impression Statement  Patient presents to clinic with continued excellent motivation to participate in therapy. Patient demonstrates deficits in LUE function and LLE function and as evidenced by increased reliance on RLE and RUE, need for VCs and TCs to encourage use of LUE. Patient able to coordinate PNF D1 UE pattern with limited PT assist during today's session and responded positively to active interventions. Patient will benefit from continued skilled therapeutic intervention to address remaining deficits in LUE function, LUE ROM, LUE strength and coordination in order to optimize independence, increase function, and improve overall QOL.    Personal Factors and Comorbidities  Age;Education;Sex;Social Background;Behavior Pattern;Finances;Past/Current Experience;Time since onset of injury/illness/exacerbation    Examination-Activity Limitations  Bathing;Bed Mobility;Dressing;Transfers;Squat;Lift;Locomotion Level;Stairs;Reach Overhead;Carry;Stand    Examination-Participation Restrictions  Interpersonal Relationship;Yard Work;Laundry;Cleaning;Community Activity;Meal Prep;Medication Management    Stability/Clinical Decision Making  Evolving/Moderate complexity    Rehab Potential  Good    PT Frequency  2x / week    PT Duration  6 weeks     PT Treatment/Interventions  ADLs/Self Care Home Management;Aquatic Therapy;Moist Heat;Cryotherapy;Gait training;Stair training;Functional mobility training;Therapeutic activities;Therapeutic exercise;Patient/family education;Neuromuscular re-education;Balance training;Orthotic Fit/Training;Manual techniques;Passive range of motion;Dry needling;Taping;Energy conservation;Splinting;Joint Manipulations;Spinal Manipulations    PT Next Visit Plan  continue UBE for NME, CKC finger extension    PT Home Exercise Plan  pincer grasp and release, spherical grasp and release    Consulted and Agree with Plan of Care  Patient       Patient will benefit from skilled therapeutic intervention in order to improve the following deficits and impairments:  Abnormal gait, Decreased balance, Decreased endurance, Decreased mobility, Difficulty walking, Hypomobility, Increased muscle spasms, Impaired tone, Decreased range of motion, Decreased coordination, Decreased strength, Impaired UE functional use, Pain  Visit Diagnosis: 1. Hemiplegia and hemiparesis following cerebral infarction affecting left non-dominant side (HCC)   2. Pain in left arm   3. Lacunar infarct, acute (HCC)   4. Other lack of coordination   5. Muscle weakness (generalized)   6. Difficulty in walking, not elsewhere classified  Problem List Patient Active Problem List   Diagnosis Date Noted  . Hemiplegia and hemiparesis following cerebral infarction affecting left non-dominant side ([place]) 11/25/2018  . 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 RBBB 10/15/2018  . Sinus bradycardia 10/15/2018  . Dysphagia, post-stroke   . Neuropathic pain   . Lacunar infarct, acute ([place]) 09/06/2018  . TIA (transient ischemic attack) 09/01/2018  . Microalbuminuria 07/29/2018  . Left leg pain 01/13/2018  . Hyperlipidemia 02/11/2017  . Essential hypertension 11/10/2016  . Diabetes ([place]) 11/10/2016  . Tobacco abuse 08/28/2013   Myles Gip PT, DPT  [phone] 02/23/2019, 12:47 PM  [facility] [address] [place], [place], [address] Phone: [phone]   Fax:  [phone]  Name: [name] MRN: [id number] Date of Birth: [birth year]

## 2019-02-24 DIAGNOSIS — E1165 Type 2 diabetes mellitus with hyperglycemia: Secondary | ICD-10-CM | POA: Diagnosis not present

## 2019-02-25 DIAGNOSIS — E1165 Type 2 diabetes mellitus with hyperglycemia: Secondary | ICD-10-CM | POA: Diagnosis not present

## 2019-02-26 DIAGNOSIS — E1165 Type 2 diabetes mellitus with hyperglycemia: Secondary | ICD-10-CM | POA: Diagnosis not present

## 2019-02-27 DIAGNOSIS — E1165 Type 2 diabetes mellitus with hyperglycemia: Secondary | ICD-10-CM | POA: Diagnosis not present

## 2019-02-28 ENCOUNTER — Other Ambulatory Visit: Payer: Self-pay

## 2019-02-28 ENCOUNTER — Ambulatory Visit: Payer: Medicaid Other | Admitting: Physical Therapy

## 2019-02-28 ENCOUNTER — Encounter: Payer: Self-pay | Admitting: Physical Therapy

## 2019-02-28 DIAGNOSIS — M6281 Muscle weakness (generalized): Secondary | ICD-10-CM | POA: Diagnosis not present

## 2019-02-28 DIAGNOSIS — M79602 Pain in left arm: Secondary | ICD-10-CM

## 2019-02-28 DIAGNOSIS — I69354 Hemiplegia and hemiparesis following cerebral infarction affecting left non-dominant side: Secondary | ICD-10-CM | POA: Diagnosis not present

## 2019-02-28 DIAGNOSIS — R262 Difficulty in walking, not elsewhere classified: Secondary | ICD-10-CM | POA: Diagnosis not present

## 2019-02-28 DIAGNOSIS — E1165 Type 2 diabetes mellitus with hyperglycemia: Secondary | ICD-10-CM | POA: Diagnosis not present

## 2019-02-28 DIAGNOSIS — I6381 Other cerebral infarction due to occlusion or stenosis of small artery: Secondary | ICD-10-CM | POA: Diagnosis not present

## 2019-02-28 DIAGNOSIS — R278 Other lack of coordination: Secondary | ICD-10-CM

## 2019-02-28 NOTE — Therapy (Signed)
[facility] [address] [place], [place], [address] Phone: [phone]   Fax:  [phone]  Physical Therapy Treatment  Patient Details  Name: Evan HOLSOPPLE Sr. MRN: [id number] Date of Birth: 1956-04-18 Referring Provider (PT): Raelyn Ensign   Encounter Date: 02/28/2019  PT End of Session - 02/28/19 1230    Visit Number  7    Number of Visits  12    Date for PT Re-Evaluation  03/29/19    Authorization Type  CCME cert through 6/16 8PT visits    PT Start Time  1013    PT Stop Time  1107    PT Time Calculation (min)  54 min    Equipment Utilized During Treatment  Gait belt    Activity Tolerance  Patient tolerated treatment well    Behavior During Therapy  WFL for tasks assessed/performed       Past Medical History:  Diagnosis Date  . Acute CVA (cerebrovascular accident) ([place]) 11/10/2016  . Diabetes 1.5, managed as type 2 ([place])   . Diabetes mellitus without complication ([place])   . Hypercholesterolemia   . Hypertension   . Pain in both feet   . Stroke [facility])     History reviewed. No pertinent surgical history.  There were no vitals filed for this visit.  Subjective Assessment - 02/28/19 1229    Subjective  Patient reports high adherence to HEP and stretching of LUE. Patient states that he has noticed some increased "jumping"/spasms in proximal shoulder girdle musculature.        TREATMENT  Manual Therapy: STM/TPR L trapezius, L periscapular area, L supraspinatus Rhythmic initiation elbow, wrist and finger extension AAROM/PROM x10 each with assessment of tone/spasticity throughout Rhythmic initiation supination x20 PROM/AAROM with assessment of tone/spasticity throughout  Neuromuscular Re-education: UBE x5 min fwd/ x5 min bwd for improved elbow extension and periscapular muscle activation without trunk compensations Ambulating in clinic and parking lot with SPC, with AFO, VCs for increased hip flexion to improve LLE  foot clearance AROM supination 3x12 with mild manual resistance AROM elbow extension 3x15 AROM finger extension 3x12 PNF D1, PT assisted faded to gentle resisted flexion, with TCs for triceps activation and improved elbow extension x25 Grasp release x15   Patient educated throughout session on appropriate technique and form using multi-modal cueing, HEP, and activity modification. Patient articulated understanding and returned demonstration.  Patient Response to interventions: Patient reports feeling like he got a workout in  ASSESSMENT Patient presents to clinic with continued excellent motivation to participate in therapy. Patient demonstrates deficits in LUE function and LLE function and as evidenced by increased reliance on RLE and RUE, need for VCs and TCs to encourage use of LUE. Patient demonstrating improved elbow extension at rest and with AROM during today's session and responded positively to active interventions. Patient able to maintain grip on UBE for 6 continuous minutes without assistance. Patient will benefit from continued skilled therapeutic intervention to address remaining deficits in LUE function, LUE ROM, LUE strength and coordination in order to optimize independence, increase function, and improve overall QOL.    PT Short Term Goals - 02/16/19 1124      PT SHORT TERM GOAL #1   Title  Patient will be independent with HEP for improved therapeutic gains and self-management.    Baseline  IE: not initiated; 02/16/2019: performign hand exercises independently    Time  4    Period  Weeks    Status  Achieved  Target Date  03/02/19        PT Long Term Goals - 02/16/19 1125      PT LONG TERM GOAL #1   Title  Pt will improve BERG by at least 3 points in order to demonstrate clinically significant improvement in balance.    Baseline  IE: 37/56    Time  6    Period  Weeks    Status  On-going    Target Date  03/29/19      PT LONG TERM GOAL #2   Title  Pt will  decrease 5TSTS by at least 3 seconds in order to demonstrate clinically significant improvement in LE strength.    Baseline  IE: unable to collect 2/2 to time limitations; 02/09/2019: 17.9 sec    Time  4    Period  Weeks    Status  On-going    Target Date  03/29/19      PT LONG TERM GOAL #3   Title  Patient will demonstrate improved LUE function as evidenced by an improvement of at least 5.2 points on the Fugl-Meyer UE assessment in order to improved independence.    Baseline  IE: not evaluated 2/2 to time; 02/07/2019: 29/66 (A: 13/36; B:4/10; C:9/14; D:3/6) indicative of severe-moderate dysfunction    Time  6    Period  Weeks    Status  On-going    Target Date  03/29/19            Plan - 02/28/19 1230    Clinical Impression Statement  Patient presents to clinic with continued excellent motivation to participate in therapy. Patient demonstrates deficits in LUE function and LLE function and as evidenced by increased reliance on RLE and RUE, need for VCs and TCs to encourage use of LUE. Patient demonstrating improved elbow extension at rest and with AROM during today's session and responded positively to active interventions. Patient able to maintain grip on UBE for 6 continuous minutes without assistance. Patient will benefit from continued skilled therapeutic intervention to address remaining deficits in LUE function, LUE ROM, LUE strength and coordination in order to optimize independence, increase function, and improve overall QOL.    Personal Factors and Comorbidities  Age;Education;Sex;Social Background;Behavior Pattern;Finances;Past/Current Experience;Time since onset of injury/illness/exacerbation    Examination-Activity Limitations  Bathing;Bed Mobility;Dressing;Transfers;Squat;Lift;Locomotion Level;Stairs;Reach Overhead;Carry;Stand    Examination-Participation Restrictions  Interpersonal Relationship;Yard Work;Laundry;Cleaning;Community Activity;Meal Prep;Medication Management     Stability/Clinical Decision Making  Evolving/Moderate complexity    Rehab Potential  Good    PT Frequency  2x / week    PT Duration  6 weeks    PT Treatment/Interventions  ADLs/Self Care Home Management;Aquatic Therapy;Moist Heat;Cryotherapy;Gait training;Stair training;Functional mobility training;Therapeutic activities;Therapeutic exercise;Patient/family education;Neuromuscular re-education;Balance training;Orthotic Fit/Training;Manual techniques;Passive range of motion;Dry needling;Taping;Energy conservation;Splinting;Joint Manipulations;Spinal Manipulations    PT Next Visit Plan  continue UBE for NME, CKC finger extension    PT Home Exercise Plan  pincer grasp and release, spherical grasp and release    Consulted and Agree with Plan of Care  Patient       Patient will benefit from skilled therapeutic intervention in order to improve the following deficits and impairments:  Abnormal gait, Decreased balance, Decreased endurance, Decreased mobility, Difficulty walking, Hypomobility, Increased muscle spasms, Impaired tone, Decreased range of motion, Decreased coordination, Decreased strength, Impaired UE functional use, Pain  Visit Diagnosis: 1. Hemiplegia and hemiparesis following cerebral infarction affecting left non-dominant side (HCC)   2. Difficulty in walking, not elsewhere classified   3. Pain in left arm  4. Lacunar infarct, acute (HCC)   5. Other lack of coordination   6. Muscle weakness (generalized)        Problem List Patient Active Problem List   Diagnosis Date Noted  . Hemiplegia and hemiparesis following cerebral infarction affecting left non-dominant side (HCC) 11/25/2018  . 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 RBBB 10/15/2018  . Sinus bradycardia 10/15/2018  . Dysphagia, post-stroke   . Neuropathic pain   . Lacunar infarct, acute (HCC) 09/06/2018  . TIA (transient ischemic attack) 09/01/2018  . Microalbuminuria 07/29/2018  . Left leg pain 01/13/2018  . Hyperlipidemia  02/11/2017  . Essential hypertension 11/10/2016  . Diabetes (HCC) 11/10/2016  . Tobacco abuse 08/28/2013   Sheria LangKatlin Rhyli Depaula PT, DPT [phone] 02/28/2019, 12:36 PM  [place] [facility] [address] [place], [place], [address] Phone: [phone]   Fax:  [phone]  Name: Evan Anouncan E Cumbie Sr. MRN: [id number] Date of Birth: 03/11/1956

## 2019-03-01 DIAGNOSIS — E1165 Type 2 diabetes mellitus with hyperglycemia: Secondary | ICD-10-CM | POA: Diagnosis not present

## 2019-03-02 ENCOUNTER — Ambulatory Visit: Payer: Medicaid Other | Admitting: Physical Therapy

## 2019-03-02 ENCOUNTER — Encounter: Payer: Self-pay | Admitting: Physical Therapy

## 2019-03-02 ENCOUNTER — Other Ambulatory Visit: Payer: Self-pay

## 2019-03-02 DIAGNOSIS — I69354 Hemiplegia and hemiparesis following cerebral infarction affecting left non-dominant side: Secondary | ICD-10-CM

## 2019-03-02 DIAGNOSIS — I6381 Other cerebral infarction due to occlusion or stenosis of small artery: Secondary | ICD-10-CM

## 2019-03-02 DIAGNOSIS — R278 Other lack of coordination: Secondary | ICD-10-CM

## 2019-03-02 DIAGNOSIS — E1165 Type 2 diabetes mellitus with hyperglycemia: Secondary | ICD-10-CM | POA: Diagnosis not present

## 2019-03-02 DIAGNOSIS — R262 Difficulty in walking, not elsewhere classified: Secondary | ICD-10-CM

## 2019-03-02 DIAGNOSIS — M6281 Muscle weakness (generalized): Secondary | ICD-10-CM | POA: Diagnosis not present

## 2019-03-02 DIAGNOSIS — M79602 Pain in left arm: Secondary | ICD-10-CM | POA: Diagnosis not present

## 2019-03-02 NOTE — Therapy (Signed)
[facility] [address] [place], [place], [address] Phone: [phone]   Fax:  [phone]  Physical Therapy Treatment  Patient Details  Name: Evan HEFT Sr. MRN: [id number] Date of Birth: March 04, 1956 Referring Provider (PT): Raelyn Ensign   Encounter Date: 03/02/2019  PT End of Session - 03/02/19 1452    Visit Number  8    Number of Visits  12    Date for PT Re-Evaluation  03/29/19    Authorization Type  CCME cert through 8/24 8PT visits    PT Start Time  2353    PT Stop Time  1115    PT Time Calculation (min)  40 min    Equipment Utilized During Treatment  Gait belt    Activity Tolerance  Patient tolerated treatment well    Behavior During Therapy  WFL for tasks assessed/performed       Past Medical History:  Diagnosis Date  . Acute CVA (cerebrovascular accident) ([place]) 11/10/2016  . Diabetes 1.5, managed as type 2 ([place])   . Diabetes mellitus without complication ([place])   . Hypercholesterolemia   . Hypertension   . Pain in both feet   . Stroke [facility])     History reviewed. No pertinent surgical history.  There were no vitals filed for this visit.  Subjective Assessment - 03/02/19 1452    Subjective  Patient reports improving elbow extension during gait on LUE and feels he is benefitting from stretches and clothespin activties for hand use.       TREATMENT  Manual Therapy: STM/TPR L trapezius, L periscapular area, L pec region   Neuromuscular Re-education: UBE x5 min fwd/ x5 min bwd for improved elbow extension and periscapular muscle activation without trunk compensations Ambulating in clinic with SPC, without AFO, minimal VCs for increased hip flexion to improve LLE foot clearance Controlled articular rotations at fingers, wrist, and elbow passive faded to active assisted for improved joint mobility and to increase afferent input x10 each joint each direction L thumb extension 3x5 AROM L forefinger extension,  PROM x20, passive range hold x10 (limited success) Pincer grasp, Y clothespin, x15 with emphasis on L forefinger control for press and release  Patient educated throughout session on appropriate technique and form using multi-modal cueing, HEP, and activity modification. Patient articulated understanding and returned demonstration.  Patient Response to interventions: Patient reports feeling like his L hand just isn't cooperating today.  ASSESSMENT Patient presents to clinic with continued excellent motivation to participate in therapy. Patient demonstrates deficits in LUE function and LLE function and as evidenced by increased reliance on RLE and RUE, need for VCs and TCs to encourage use of LUE. Patient demonstrating improved L thumb extension control and strength during today's session and responded positively to active interventions. Patient able to coordinate combined elbow flexion with supination and pronation. Patient will benefit from continued skilled therapeutic intervention to address remaining deficits in LUE function, LUE ROM, LUE strength and coordination in order to optimize independence, increase function, and improve overall QOL.  PT Short Term Goals - 02/16/19 1124      PT SHORT TERM GOAL #1   Title  Patient will be independent with HEP for improved therapeutic gains and self-management.    Baseline  IE: not initiated; 02/16/2019: performign hand exercises independently    Time  4    Period  Weeks    Status  Achieved    Target Date  03/02/19  PT Long Term Goals - 02/16/19 1125      PT LONG TERM GOAL #1   Title  Pt will improve BERG by at least 3 points in order to demonstrate clinically significant improvement in balance.    Baseline  IE: 37/56    Time  6    Period  Weeks    Status  On-going    Target Date  03/29/19      PT LONG TERM GOAL #2   Title  Pt will decrease 5TSTS by at least 3 seconds in order to demonstrate clinically significant improvement in LE  strength.    Baseline  IE: unable to collect 2/2 to time limitations; 02/09/2019: 17.9 sec    Time  4    Period  Weeks    Status  On-going    Target Date  03/29/19      PT LONG TERM GOAL #3   Title  Patient will demonstrate improved LUE function as evidenced by an improvement of at least 5.2 points on the Fugl-Meyer UE assessment in order to improved independence.    Baseline  IE: not evaluated 2/2 to time; 02/07/2019: 29/66 (A: 13/36; B:4/10; C:9/14; D:3/6) indicative of severe-moderate dysfunction    Time  6    Period  Weeks    Status  On-going    Target Date  03/29/19            Plan - 03/02/19 1453    Clinical Impression Statement  Patient presents to clinic with continued excellent motivation to participate in therapy. Patient demonstrates deficits in LUE function and LLE function and as evidenced by increased reliance on RLE and RUE, need for VCs and TCs to encourage use of LUE. Patient demonstrating improved L thumb extension control and strength during today's session and responded positively to active interventions. Patient able to coordinate combined elbow flexion with supination and pronation. Patient will benefit from continued skilled therapeutic intervention to address remaining deficits in LUE function, LUE ROM, LUE strength and coordination in order to optimize independence, increase function, and improve overall QOL.    Personal Factors and Comorbidities  Age;Education;Sex;Social Background;Behavior Pattern;Finances;Past/Current Experience;Time since onset of injury/illness/exacerbation    Examination-Activity Limitations  Bathing;Bed Mobility;Dressing;Transfers;Squat;Lift;Locomotion Level;Stairs;Reach Overhead;Carry;Stand    Examination-Participation Restrictions  Interpersonal Relationship;Yard Work;Laundry;Cleaning;Community Activity;Meal Prep;Medication Management    Stability/Clinical Decision Making  Evolving/Moderate complexity    Rehab Potential  Good    PT  Frequency  2x / week    PT Duration  6 weeks    PT Treatment/Interventions  ADLs/Self Care Home Management;Aquatic Therapy;Moist Heat;Cryotherapy;Gait training;Stair training;Functional mobility training;Therapeutic activities;Therapeutic exercise;Patient/family education;Neuromuscular re-education;Balance training;Orthotic Fit/Training;Manual techniques;Passive range of motion;Dry needling;Taping;Energy conservation;Splinting;Joint Manipulations;Spinal Manipulations    PT Next Visit Plan  continue UBE for NME, CKC finger extension    PT Home Exercise Plan  pincer grasp and release, spherical grasp and release    Consulted and Agree with Plan of Care  Patient       Patient will benefit from skilled therapeutic intervention in order to improve the following deficits and impairments:  Abnormal gait, Decreased balance, Decreased endurance, Decreased mobility, Difficulty walking, Hypomobility, Increased muscle spasms, Impaired tone, Decreased range of motion, Decreased coordination, Decreased strength, Impaired UE functional use, Pain  Visit Diagnosis: 1. Hemiplegia and hemiparesis following cerebral infarction affecting left non-dominant side (HCC)   2. Muscle weakness (generalized)   3. Difficulty in walking, not elsewhere classified   4. Pain in left arm   5. Lacunar infarct, acute (HCC)  6. Other lack of coordination        Problem List Patient Active Problem List   Diagnosis Date Noted  . Hemiplegia and hemiparesis following cerebral infarction affecting left non-dominant side (HCC) 11/25/2018  . 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 RBBB 10/15/2018  . Sinus bradycardia 10/15/2018  . Dysphagia, post-stroke   . Neuropathic pain   . Lacunar infarct, acute (HCC) 09/06/2018  . TIA (transient ischemic attack) 09/01/2018  . Microalbuminuria 07/29/2018  . Left leg pain 01/13/2018  . Hyperlipidemia 02/11/2017  . Essential hypertension 11/10/2016  . Diabetes (HCC) 11/10/2016  . Tobacco abuse  08/28/2013   Sheria LangKatlin Daishaun Ayre PT, DPT [phone] 03/02/2019, 2:59 PM  [facility] [address] [place], [place], [address] Phone: [phone]   Fax:  [phone]  Name: Evan Anouncan E Mersman Sr. MRN: [id number] Date of Birth: 03/11/1956

## 2019-03-03 DIAGNOSIS — E1165 Type 2 diabetes mellitus with hyperglycemia: Secondary | ICD-10-CM | POA: Diagnosis not present

## 2019-03-04 DIAGNOSIS — E1165 Type 2 diabetes mellitus with hyperglycemia: Secondary | ICD-10-CM | POA: Diagnosis not present

## 2019-03-05 DIAGNOSIS — E1165 Type 2 diabetes mellitus with hyperglycemia: Secondary | ICD-10-CM | POA: Diagnosis not present

## 2019-03-07 ENCOUNTER — Other Ambulatory Visit: Payer: Self-pay

## 2019-03-07 ENCOUNTER — Ambulatory Visit: Payer: Medicaid Other | Admitting: Physical Therapy

## 2019-03-07 ENCOUNTER — Encounter: Payer: Self-pay | Admitting: Physical Therapy

## 2019-03-07 DIAGNOSIS — I69354 Hemiplegia and hemiparesis following cerebral infarction affecting left non-dominant side: Secondary | ICD-10-CM

## 2019-03-07 DIAGNOSIS — M79602 Pain in left arm: Secondary | ICD-10-CM | POA: Diagnosis not present

## 2019-03-07 DIAGNOSIS — M6281 Muscle weakness (generalized): Secondary | ICD-10-CM

## 2019-03-07 DIAGNOSIS — E1165 Type 2 diabetes mellitus with hyperglycemia: Secondary | ICD-10-CM | POA: Diagnosis not present

## 2019-03-07 DIAGNOSIS — R262 Difficulty in walking, not elsewhere classified: Secondary | ICD-10-CM | POA: Diagnosis not present

## 2019-03-07 DIAGNOSIS — I6381 Other cerebral infarction due to occlusion or stenosis of small artery: Secondary | ICD-10-CM | POA: Diagnosis not present

## 2019-03-07 DIAGNOSIS — R278 Other lack of coordination: Secondary | ICD-10-CM

## 2019-03-07 NOTE — Therapy (Signed)
[facility] [address] [place], [place], [address] Phone: [phone]   Fax:  [phone]  Physical Therapy Treatment  Patient Details  Name: Evan TOPPER Sr. MRN: [id number] Date of Birth: 01-05-1956 Referring Provider (PT): Raelyn Ensign   Encounter Date: 03/07/2019  PT End of Session - 03/07/19 1247    Visit Number  9    Number of Visits  12    Date for PT Re-Evaluation  03/29/19    Authorization Type  CCME cert through 2/70 8PT visits    PT Start Time  1033    PT Stop Time  1128    PT Time Calculation (min)  55 min    Equipment Utilized During Treatment  Gait belt    Activity Tolerance  Patient tolerated treatment well    Behavior During Therapy  WFL for tasks assessed/performed       Past Medical History:  Diagnosis Date  . Acute CVA (cerebrovascular accident) ([place]) 11/10/2016  . Diabetes 1.5, managed as type 2 ([place])   . Diabetes mellitus without complication ([place])   . Hypercholesterolemia   . Hypertension   . Pain in both feet   . Stroke [facility])     History reviewed. No pertinent surgical history.  There were no vitals filed for this visit.  Subjective Assessment - 03/07/19 1244    Subjective  Patient states that he had a nice weekend and has noticed that occsaionally his L elbow hangs straighter by his side when he is walking. He continues to do his HEP with good consistency.       TREATMENT  Manual Therapy: STM/TPR L trapezius, L periscapular area, L pec region Rhythmic initiation for supination x20 Rhythmic initiation for elbow extension with TCs to L triceps  Neuromuscular Re-education: UBE x3 min fwd/ x3 min bwd for improved elbow extension and periscapular muscle activation without trunk compensations Ambulating in clinic with SPC, with AFO, minimal VCs for increased hip flexion to improve LLE foot clearance Controlled articular rotations at fingers, wrist, and elbow passive faded to active assisted  for improved joint mobility and to increase afferent input x10 each joint each direction L pec stretch at doorway, elbow bent and passive finger extension x2 min L supination stretch x3 min L thumb extension 3x8 AROM L forefinger extension, PROM x20, passive range hold x10 (better ROM this visit) L full hand flexion and extension AROM x10 L finger extension passive range hold with PT support x 3 min Car transfer with LUE use for door opening  Patient educated throughout session on appropriate technique and form using multi-modal cueing, HEP, and activity modification. Patient articulated understanding and returned demonstration.  Patient Response to interventions: Patient reports feeling like his L forefinger is loosening up and it wants to work.  ASSESSMENT Patient presents to clinic with continued excellent motivation to participate in therapy. Patient demonstrates deficits in LUE function and LLE function and as evidenced by increased reliance on RLE and RUE, continued need for VCs to encourage use of LUE. Patient demonstrating improved L forefinger control and strength during today's session and responded positively to active interventions. Patient able to demonstrate appropriate techniques for self mobilization of L wrist. Patient will benefit from continued skilled therapeutic intervention to address remaining deficits in LUE function, LUE ROM, LUE strength and coordination in order to optimize independence, increase function, and improve overall QOL.     PT Short Term Goals - 02/16/19 1124  PT SHORT TERM GOAL #1   Title  Patient will be independent with HEP for improved therapeutic gains and self-management.    Baseline  IE: not initiated; 02/16/2019: performign hand exercises independently    Time  4    Period  Weeks    Status  Achieved    Target Date  03/02/19        PT Long Term Goals - 02/16/19 1125      PT LONG TERM GOAL #1   Title  Pt will improve BERG by at least 3  points in order to demonstrate clinically significant improvement in balance.    Baseline  IE: 37/56    Time  6    Period  Weeks    Status  On-going    Target Date  03/29/19      PT LONG TERM GOAL #2   Title  Pt will decrease 5TSTS by at least 3 seconds in order to demonstrate clinically significant improvement in LE strength.    Baseline  IE: unable to collect 2/2 to time limitations; 02/09/2019: 17.9 sec    Time  4    Period  Weeks    Status  On-going    Target Date  03/29/19      PT LONG TERM GOAL #3   Title  Patient will demonstrate improved LUE function as evidenced by an improvement of at least 5.2 points on the Fugl-Meyer UE assessment in order to improved independence.    Baseline  IE: not evaluated 2/2 to time; 02/07/2019: 29/66 (A: 13/36; B:4/10; C:9/14; D:3/6) indicative of severe-moderate dysfunction    Time  6    Period  Weeks    Status  On-going    Target Date  03/29/19            Plan - 03/07/19 1248    Clinical Impression Statement  Patient presents to clinic with continued excellent motivation to participate in therapy. Patient demonstrates deficits in LUE function and LLE function and as evidenced by increased reliance on RLE and RUE, continued need for VCs to encourage use of LUE. Patient demonstrating improved L forefinger control and strength during today's session and responded positively to active interventions. Patient able to demonstrate appropriate techniques for self mobilization of L wrist. Patient will benefit from continued skilled therapeutic intervention to address remaining deficits in LUE function, LUE ROM, LUE strength and coordination in order to optimize independence, increase function, and improve overall QOL.    Personal Factors and Comorbidities  Age;Education;Sex;Social Background;Behavior Pattern;Finances;Past/Current Experience;Time since onset of injury/illness/exacerbation    Examination-Activity Limitations  Bathing;Bed  Mobility;Dressing;Transfers;Squat;Lift;Locomotion Level;Stairs;Reach Overhead;Carry;Stand    Examination-Participation Restrictions  Interpersonal Relationship;Yard Work;Laundry;Cleaning;Community Activity;Meal Prep;Medication Management    Stability/Clinical Decision Making  Evolving/Moderate complexity    Rehab Potential  Good    PT Frequency  2x / week    PT Duration  6 weeks    PT Treatment/Interventions  ADLs/Self Care Home Management;Aquatic Therapy;Moist Heat;Cryotherapy;Gait training;Stair training;Functional mobility training;Therapeutic activities;Therapeutic exercise;Patient/family education;Neuromuscular re-education;Balance training;Orthotic Fit/Training;Manual techniques;Passive range of motion;Dry needling;Taping;Energy conservation;Splinting;Joint Manipulations;Spinal Manipulations    PT Next Visit Plan  continue UBE for NME, CKC finger extension    PT Home Exercise Plan  pincer grasp and release, spherical grasp and release    Consulted and Agree with Plan of Care  Patient       Patient will benefit from skilled therapeutic intervention in order to improve the following deficits and impairments:  Abnormal gait, Decreased balance, Decreased endurance, Decreased mobility, Difficulty walking,  Hypomobility, Increased muscle spasms, Impaired tone, Decreased range of motion, Decreased coordination, Decreased strength, Impaired UE functional use, Pain  Visit Diagnosis: 1. Hemiplegia and hemiparesis following cerebral infarction affecting left non-dominant side (HCC)   2. Muscle weakness (generalized)   3. Difficulty in walking, not elsewhere classified   4. Pain in left arm   5. Lacunar infarct, acute (HCC)   6. Other lack of coordination        Problem List Patient Active Problem List   Diagnosis Date Noted  . Hemiplegia and hemiparesis following cerebral infarction affecting left non-dominant side (HCC) 11/25/2018  . 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 RBBB 10/15/2018  . Sinus  bradycardia 10/15/2018  . Dysphagia, post-stroke   . Neuropathic pain   . Lacunar infarct, acute (HCC) 09/06/2018  . TIA (transient ischemic attack) 09/01/2018  . Microalbuminuria 07/29/2018  . Left leg pain 01/13/2018  . Hyperlipidemia 02/11/2017  . Essential hypertension 11/10/2016  . Diabetes (HCC) 11/10/2016  . Tobacco abuse 08/28/2013   Sheria LangKatlin  PT, DPT [phone] 03/07/2019, 1:00 PM  [facility] [address] [place], [place], [address] Phone: [phone]   Fax:  [phone]  Name: Evan Anouncan E Brereton Sr. MRN: [id number] Date of Birth: 03/11/1956

## 2019-03-08 DIAGNOSIS — E1165 Type 2 diabetes mellitus with hyperglycemia: Secondary | ICD-10-CM | POA: Diagnosis not present

## 2019-03-09 ENCOUNTER — Other Ambulatory Visit: Payer: Self-pay

## 2019-03-09 ENCOUNTER — Ambulatory Visit: Payer: Medicaid Other | Admitting: Physical Therapy

## 2019-03-09 DIAGNOSIS — Z596 Low income: Secondary | ICD-10-CM | POA: Diagnosis not present

## 2019-03-09 DIAGNOSIS — I6381 Other cerebral infarction due to occlusion or stenosis of small artery: Secondary | ICD-10-CM

## 2019-03-09 DIAGNOSIS — R262 Difficulty in walking, not elsewhere classified: Secondary | ICD-10-CM | POA: Diagnosis not present

## 2019-03-09 DIAGNOSIS — I69354 Hemiplegia and hemiparesis following cerebral infarction affecting left non-dominant side: Secondary | ICD-10-CM

## 2019-03-09 DIAGNOSIS — R278 Other lack of coordination: Secondary | ICD-10-CM | POA: Diagnosis not present

## 2019-03-09 DIAGNOSIS — M79602 Pain in left arm: Secondary | ICD-10-CM | POA: Diagnosis not present

## 2019-03-09 DIAGNOSIS — E1165 Type 2 diabetes mellitus with hyperglycemia: Secondary | ICD-10-CM | POA: Diagnosis not present

## 2019-03-09 DIAGNOSIS — M6281 Muscle weakness (generalized): Secondary | ICD-10-CM | POA: Diagnosis not present

## 2019-03-09 NOTE — Therapy (Addendum)
[facility] [address] [place], [place], [address] Phone: [phone]   Fax:  [phone]  Physical Therapy Treatment Physical Therapy Progress Note   Dates of reporting period  02/02/2019   to   03/09/2019   Patient Details  Name: Evan AnoDuncan E Cragle Sr. MRN: [id number] Date of Birth: 03/11/1956 Referring Provider (PT): Maurice SmallBoyce, Emily   Encounter Date: 03/09/2019  PT End of Session - 03/09/19 1759    Visit Number  10    Number of Visits  12    Date for PT Re-Evaluation  03/29/19    Authorization Type  CCME cert through 8/12 8PT visits    PT Start Time  1015    PT Stop Time  1100    PT Time Calculation (min)  45 min    Equipment Utilized During Treatment  Gait belt    Activity Tolerance  Patient tolerated treatment well    Behavior During Therapy  WFL for tasks assessed/performed       Past Medical History:  Diagnosis Date  . Acute CVA (cerebrovascular accident) (HCC) 11/10/2016  . Diabetes 1.5, managed as type 2 (HCC)   . Diabetes mellitus without complication (HCC)   . Hypercholesterolemia   . Hypertension   . Pain in both feet   . Stroke [facility](HCC)     No past surgical history on file.  There were no vitals filed for this visit.  Subjective Assessment - 03/09/19 1758    Subjective  Patient reports increased stiffness on LUE and notes that he did sleep on his left side last night.        TREATMENT  Manual Therapy: STM/TPR L trapezius, L periscapular area, L pec region  Neuromuscular Re-education: UBE x3 min fwd/ x3 min bwd for improved elbow extension and periscapular muscle activation without trunk compensations Ambulating in clinic with SPC, with AFO, no VCs for increased hip flexion to improve LLE foot clearance Controlled articular rotations at fingers, wrist, and elbow passive faded to active assisted for improved joint mobility and to increase afferent input x10 each joint each direction L supination stretch x3  min L thumb extension 3x8 AROM L forefinger extension, PROM x20, passive range hold x10 (better ROM this visit) L wrist extension passive range hold with PT support x 10 (improved ability to maintain position when support removed)   Patient educated throughout session on appropriate technique and form using multi-modal cueing, HEP, and activity modification. Patient articulated understanding and returned demonstration.  Patient Response to interventions: Patient reports feeling like his LUE is still stiff proximally.   ASSESSMENT Patient presents to clinic with continued excellent motivation to participate in therapy. Patient demonstrates deficits in LUE function and LLE function and as evidenced by increased reliance on RLE and RUE, continued need for VCs to encourage use of LUE. Patient demonstrating improved L wrist extension strength during today's session and responded positively to active interventions. Patient able to demonstrate appropriate techniques for self mobilization of L fingers. Patient will benefit from continued skilled therapeutic intervention to address remaining deficits in LUE function, LUE ROM, LUE strength and coordination in order to optimize independence, increase function, and improve overall QOL.      PT Short Term Goals - 02/16/19 1124      PT SHORT TERM GOAL #1   Title  Patient will be independent with HEP for improved therapeutic gains and self-management.    Baseline  IE: not initiated; 02/16/2019: performign hand exercises independently  Time  4    Period  Weeks    Status  Achieved    Target Date  03/02/19        PT Long Term Goals - 02/16/19 1125      PT LONG TERM GOAL #1   Title  Pt will improve BERG by at least 3 points in order to demonstrate clinically significant improvement in balance.    Baseline  IE: 37/56    Time  6    Period  Weeks    Status  On-going    Target Date  03/29/19      PT LONG TERM GOAL #2   Title  Pt will decrease  5TSTS by at least 3 seconds in order to demonstrate clinically significant improvement in LE strength.    Baseline  IE: unable to collect 2/2 to time limitations; 02/09/2019: 17.9 sec    Time  4    Period  Weeks    Status  On-going    Target Date  03/29/19      PT LONG TERM GOAL #3   Title  Patient will demonstrate improved LUE function as evidenced by an improvement of at least 5.2 points on the Fugl-Meyer UE assessment in order to improved independence.    Baseline  IE: not evaluated 2/2 to time; 02/07/2019: 29/66 (A: 13/36; B:4/10; C:9/14; D:3/6) indicative of severe-moderate dysfunction    Time  6    Period  Weeks    Status  On-going    Target Date  03/29/19            Plan - 03/09/19 1800    Clinical Impression Statement  Patient presents to clinic with continued excellent motivation to participate in therapy. Patient demonstrates deficits in LUE function and LLE function and as evidenced by increased reliance on RLE and RUE, continued need for VCs to encourage use of LUE. Patient demonstrating improved L wrist extension strength during today's session and responded positively to active interventions. Patient able to demonstrate appropriate techniques for self mobilization of L fingers. Patient will benefit from continued skilled therapeutic intervention to address remaining deficits in LUE function, LUE ROM, LUE strength and coordination in order to optimize independence, increase function, and improve overall QOL.    Personal Factors and Comorbidities  Age;Education;Sex;Social Background;Behavior Pattern;Finances;Past/Current Experience;Time since onset of injury/illness/exacerbation    Examination-Activity Limitations  Bathing;Bed Mobility;Dressing;Transfers;Squat;Lift;Locomotion Level;Stairs;Reach Overhead;Carry;Stand    Examination-Participation Restrictions  Interpersonal Relationship;Yard Work;Laundry;Cleaning;Community Activity;Meal Prep;Medication Management     Stability/Clinical Decision Making  Evolving/Moderate complexity    Rehab Potential  Good    PT Frequency  2x / week    PT Duration  6 weeks    PT Treatment/Interventions  ADLs/Self Care Home Management;Aquatic Therapy;Moist Heat;Cryotherapy;Gait training;Stair training;Functional mobility training;Therapeutic activities;Therapeutic exercise;Patient/family education;Neuromuscular re-education;Balance training;Orthotic Fit/Training;Manual techniques;Passive range of motion;Dry needling;Taping;Energy conservation;Splinting;Joint Manipulations;Spinal Manipulations    PT Next Visit Plan  continue UBE for NME, CKC finger extension    PT Home Exercise Plan  pincer grasp and release, spherical grasp and release    Consulted and Agree with Plan of Care  Patient       Patient will benefit from skilled therapeutic intervention in order to improve the following deficits and impairments:  Abnormal gait, Decreased balance, Decreased endurance, Decreased mobility, Difficulty walking, Hypomobility, Increased muscle spasms, Impaired tone, Decreased range of motion, Decreased coordination, Decreased strength, Impaired UE functional use, Pain  Visit Diagnosis: 1. Hemiplegia and hemiparesis following cerebral infarction affecting left non-dominant side (HCC)   2. Other  lack of coordination   3. Muscle weakness (generalized)   4. Difficulty in walking, not elsewhere classified   5. Pain in left arm   6. Lacunar infarct, acute [facility])        Problem List Patient Active Problem List   Diagnosis Date Noted  . Hemiplegia and hemiparesis following cerebral infarction affecting left non-dominant side ([place]) 11/25/2018  . 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 RBBB 10/15/2018  . Sinus bradycardia 10/15/2018  . Dysphagia, post-stroke   . Neuropathic pain   . Lacunar infarct, acute ([place]) 09/06/2018  . TIA (transient ischemic attack) 09/01/2018  . Microalbuminuria 07/29/2018  . Left leg pain 01/13/2018  . Hyperlipidemia  02/11/2017  . Essential hypertension 11/10/2016  . Diabetes ([place]) 11/10/2016  . Tobacco abuse 08/28/2013   Myles Gip PT, DPT [phone] 03/09/2019, 6:04 PM  [facility] [address] [place], [place], [address] Phone: [phone]   Fax:  [phone]  Name: Evan Cones Sr. MRN: [id number] Date of Birth: May 04, 1956

## 2019-03-10 DIAGNOSIS — E1165 Type 2 diabetes mellitus with hyperglycemia: Secondary | ICD-10-CM | POA: Diagnosis not present

## 2019-03-11 DIAGNOSIS — E1165 Type 2 diabetes mellitus with hyperglycemia: Secondary | ICD-10-CM | POA: Diagnosis not present

## 2019-03-12 DIAGNOSIS — E1165 Type 2 diabetes mellitus with hyperglycemia: Secondary | ICD-10-CM | POA: Diagnosis not present

## 2019-03-13 DIAGNOSIS — E1165 Type 2 diabetes mellitus with hyperglycemia: Secondary | ICD-10-CM | POA: Diagnosis not present

## 2019-03-14 ENCOUNTER — Encounter: Payer: Self-pay | Admitting: Physical Therapy

## 2019-03-14 ENCOUNTER — Ambulatory Visit: Payer: Medicaid Other | Admitting: Physical Therapy

## 2019-03-14 ENCOUNTER — Other Ambulatory Visit: Payer: Self-pay

## 2019-03-14 DIAGNOSIS — M6281 Muscle weakness (generalized): Secondary | ICD-10-CM

## 2019-03-14 DIAGNOSIS — I69354 Hemiplegia and hemiparesis following cerebral infarction affecting left non-dominant side: Secondary | ICD-10-CM | POA: Diagnosis not present

## 2019-03-14 DIAGNOSIS — R262 Difficulty in walking, not elsewhere classified: Secondary | ICD-10-CM | POA: Diagnosis not present

## 2019-03-14 DIAGNOSIS — R278 Other lack of coordination: Secondary | ICD-10-CM

## 2019-03-14 DIAGNOSIS — Z596 Low income: Secondary | ICD-10-CM | POA: Diagnosis not present

## 2019-03-14 DIAGNOSIS — I6381 Other cerebral infarction due to occlusion or stenosis of small artery: Secondary | ICD-10-CM

## 2019-03-14 DIAGNOSIS — M79602 Pain in left arm: Secondary | ICD-10-CM

## 2019-03-14 DIAGNOSIS — E1165 Type 2 diabetes mellitus with hyperglycemia: Secondary | ICD-10-CM | POA: Diagnosis not present

## 2019-03-14 NOTE — Therapy (Signed)
[facility] [address] [place], [place], [address] Phone: [phone]   Fax:  [phone]  Physical Therapy Treatment  Patient Details  Name: Evan AnoDuncan E Kelsay Sr. MRN: [id number] Date of Birth: 03/11/1956 Referring Provider (PT): Maurice SmallBoyce, Evan   Encounter Date: 03/14/2019  PT End of Session - 03/14/19 1133    Visit Number  11    Number of Visits  12    Date for PT Re-Evaluation  03/29/19    Authorization Type  CCME cert through 8/12 8PT visits    PT Start Time  1020    PT Stop Time  1116    PT Time Calculation (min)  56 min    Equipment Utilized During Treatment  Gait belt    Activity Tolerance  Patient tolerated treatment well    Behavior During Therapy  WFL for tasks assessed/performed       Past Medical History:  Diagnosis Date  . Acute CVA (cerebrovascular accident) (HCC) 11/10/2016  . Diabetes 1.5, managed as type 2 (HCC)   . Diabetes mellitus without complication (HCC)   . Hypercholesterolemia   . Hypertension   . Pain in both feet   . Stroke [facility](HCC)     History reviewed. No pertinent surgical history.  There were no vitals filed for this visit.  Subjective Assessment - 03/14/19 1201    Subjective  Patient reports having a nice birthday over the weekend and denies any significant changes/concerns. Patient notes that he has been better able to actively extend his fingers when performing HEP.      TREATMENT  Neuromuscular Re-education: Reassessed goals, see below.  Ambulation in clinic, without AFO, SPC, x300 feet. VCs for foot clearance and controlled contact UBE x8 min with emphasis on elbow extension; minimal PT assist for maintaining grip LUE Pronation/supination AROM x15 LUE Wrist extension AROM x15 Sit to stand x10 with SUE support, and VCs for posture and coordination and controlled descent to seat  Patient educated throughout session on appropriate technique and form using multi-modal cueing, HEP, and  activity modification. Patient articulated understanding and returned demonstration.  Patient Response to interventions: Patient reports no increased pain, but notes some increased soreness in biceps and fasciculations in triceps during UBE.      [facility] PT Assessment - 03/14/19 0001      Standardized Balance Assessment   Standardized Balance Assessment  Berg Balance Test      Berg Balance Test   Sit to Stand  Able to stand  independently using hands    Standing Unsupported  Able to stand safely 2 minutes    Sitting with Back Unsupported but Feet Supported on Floor or Stool  Able to sit safely and securely 2 minutes    Stand to Sit  Sits safely with minimal use of hands    Transfers  Able to transfer safely, minor use of hands    Standing Unsupported with Eyes Closed  Able to stand 10 seconds safely    Standing Unsupported with Feet Together  Able to place feet together independently and stand 1 minute safely    From Standing, Reach Forward with Outstretched Arm  Can reach confidently >25 cm (10")    From Standing Position, Pick up Object from Floor  Able to pick up shoe safely and easily    From Standing Position, Turn to Look Behind Over each Shoulder  Looks behind from both sides and weight shifts well    Turn 360 Degrees  Able  to turn 360 degrees safely but slowly    Standing Unsupported, Alternately Place Feet on Step/Stool  Able to complete >2 steps/needs minimal assist    Standing Unsupported, One Foot in Front  Able to plae foot ahead of the other independently and hold 30 seconds    Standing on One Leg  Tries to lift leg/unable to hold 3 seconds but remains standing independently    Total Score  46    Berg comment:  Moderate risk of falls        ASSESSMENT Patient presents to clinic with continued excellent motivation to participate in therapy. Patient demonstrates deficits in LUE function and LLE function and as evidenced by lack of full AROM at LUE and LLE, continued benefit  from tactile cueing to encourage appropriate coordination and muscle activation of LUE. Patient demonstrating improved balance and improved LUE use/function (as evidenced by Sharlene MottsBerg and Fugl-Myer) during today's session and responded positively to active interventions. Patient's condition has the potential to improve in response to therapy. Maximum improvement is yet to be obtained. The anticipated improvement is attainable and reasonable in a generally predictable time. Patient will benefit from continued skilled therapeutic intervention to address remaining deficits in LLE and LUE function, LLE and LUE AROM, LLE and LUE strength and coordination in order to optimize independence, increase function, and improve overall QOL.     PT Short Term Goals - 02/16/19 1124      PT SHORT TERM GOAL #1   Title  Patient will be independent with HEP for improved therapeutic gains and self-management.    Baseline  IE: not initiated; 02/16/2019: performign hand exercises independently    Time  4    Period  Weeks    Status  Achieved    Target Date  03/02/19        PT Long Term Goals - 03/14/19 1053      PT LONG TERM GOAL #1   Title  Pt will improve BERG by at least 3 points in order to demonstrate clinically significant improvement in balance.    Baseline  IE: 37/56; 03/14/2019: 46/56    Time  8    Period  Weeks    Status  On-going    Target Date  05/09/19      PT LONG TERM GOAL #2   Title  Pt will decrease 5TSTS by at least 3 seconds in order to demonstrate clinically significant improvement in LE strength.    Baseline  IE: unable to collect 2/2 to time limitations; 02/09/2019: 17.9 sec; 03/14/2019: 17.1 sec    Time  8    Period  Weeks    Status  On-going    Target Date  05/09/19      PT LONG TERM GOAL #3   Title  Patient will demonstrate improved LUE function as evidenced by an improvement of at least 5.2 points on the Fugl-Meyer UE assessment in order to improved independence.    Baseline  IE: not  evaluated 2/2 to time; 02/07/2019: 29/66 (A: 13/36; B:4/10; C:9/14; D:3/6) indicative of severe-moderate dysfunction; 03/14/2019: 29/66 (A: 23/36; B:5/10; C:12/14; D:3/6) indicative of moderate dysfunction    Time  8    Period  Weeks    Status  On-going    Target Date  05/09/19      PT LONG TERM GOAL #4   Title  Patient will demonstrate a gait speed of >0.8 m/s with SPC (or at minimum a 0.16 m/s improvement for MCID) (w/ or w/o  AFO) as indicated by 10 MWT  in order to ensure safe community ambulation.    Baseline  03/14/2019: 0.47m/s limited community ambulator    Time  8    Period  Weeks    Status  New    Target Date  05/09/19            Plan - 03/14/19 1133    Clinical Impression Statement  Patient presents to clinic with continued excellent motivation to participate in therapy. Patient demonstrates deficits in LUE function and LLE function and as evidenced by lack of full AROM at LUE and LLE, continued benefit from tactile cueing to encourage appropriate coordination and muscle activation of LUE. Patient demonstrating improved balance and improved LUE use/function (as evidenced by Merrilee Jansky and Fugl-Myer) during today's session and responded positively to active interventions. Patient's condition has the potential to improve in response to therapy. Maximum improvement is yet to be obtained. The anticipated improvement is attainable and reasonable in a generally predictable time. Patient will benefit from continued skilled therapeutic intervention to address remaining deficits in LLE and LUE function, LLE and LUE AROM, LLE and LUE strength and coordination in order to optimize independence, increase function, and improve overall QOL.    Personal Factors and Comorbidities  Age;Education;Sex;Social Background;Behavior Pattern;Finances;Past/Current Experience;Time since onset of injury/illness/exacerbation    Examination-Activity Limitations  Bathing;Bed  Mobility;Dressing;Transfers;Squat;Lift;Locomotion Level;Stairs;Reach Overhead;Carry;Stand    Examination-Participation Restrictions  Interpersonal Relationship;Yard Work;Laundry;Cleaning;Community Activity;Meal Prep;Medication Management    Stability/Clinical Decision Making  Evolving/Moderate complexity    Rehab Potential  Good    PT Frequency  2x / week    PT Duration  8 weeks    PT Treatment/Interventions  ADLs/Self Care Home Management;Aquatic Therapy;Moist Heat;Cryotherapy;Gait training;Stair training;Functional mobility training;Therapeutic activities;Therapeutic exercise;Patient/family education;Neuromuscular re-education;Balance training;Orthotic Fit/Training;Manual techniques;Passive range of motion;Dry needling;Taping;Energy conservation;Splinting;Joint Manipulations;Spinal Manipulations    PT Next Visit Plan  continue UBE for NME, CKC finger extension    PT Home Exercise Plan  pincer grasp and release, spherical grasp and release    Consulted and Agree with Plan of Care  Patient       Patient will benefit from skilled therapeutic intervention in order to improve the following deficits and impairments:  Abnormal gait, Decreased balance, Decreased endurance, Decreased mobility, Difficulty walking, Hypomobility, Increased muscle spasms, Impaired tone, Decreased range of motion, Decreased coordination, Decreased strength, Impaired UE functional use, Pain  Visit Diagnosis: 1. Hemiplegia and hemiparesis following cerebral infarction affecting left non-dominant side (HCC)   2. Other lack of coordination   3. Muscle weakness (generalized)   4. Difficulty in walking, not elsewhere classified   5. Pain in left arm   6. Lacunar infarct, acute [facility])        Problem List Patient Active Problem List   Diagnosis Date Noted  . Hemiplegia and hemiparesis following cerebral infarction affecting left non-dominant side ([place]) 11/25/2018  . 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 RBBB 10/15/2018  . Sinus  bradycardia 10/15/2018  . Dysphagia, post-stroke   . Neuropathic pain   . Lacunar infarct, acute ([place]) 09/06/2018  . TIA (transient ischemic attack) 09/01/2018  . Microalbuminuria 07/29/2018  . Left leg pain 01/13/2018  . Hyperlipidemia 02/11/2017  . Essential hypertension 11/10/2016  . Diabetes ([place]) 11/10/2016  . Tobacco abuse 08/28/2013   Myles Gip PT, DPT [phone] 03/14/2019, 12:12 PM  [facility] [address] [place], [place], [address] Phone: [phone]   Fax:  [phone]  Name: Evan Cones Sr. MRN: [id number] Date of  Birth: 03/11/1956

## 2019-03-15 ENCOUNTER — Other Ambulatory Visit: Payer: Self-pay

## 2019-03-15 ENCOUNTER — Telehealth: Payer: Self-pay

## 2019-03-15 DIAGNOSIS — K219 Gastro-esophageal reflux disease without esophagitis: Secondary | ICD-10-CM

## 2019-03-15 DIAGNOSIS — E1165 Type 2 diabetes mellitus with hyperglycemia: Secondary | ICD-10-CM | POA: Diagnosis not present

## 2019-03-15 MED ORDER — PANTOPRAZOLE SODIUM 20 MG PO TBEC: 20.0000 mg | DELAYED_RELEASE_TABLET | Freq: Two times a day (BID) | ORAL | 0 refills | Status: DC

## 2019-03-15 NOTE — Telephone Encounter (Signed)
Pt told pharmacy taking bid is this correct? If so needs refill

## 2019-03-16 ENCOUNTER — Ambulatory Visit (INDEPENDENT_AMBULATORY_CARE_PROVIDER_SITE_OTHER): Payer: Medicaid Other | Admitting: Podiatry

## 2019-03-16 ENCOUNTER — Other Ambulatory Visit: Payer: Self-pay

## 2019-03-16 ENCOUNTER — Encounter: Payer: Self-pay | Admitting: Podiatry

## 2019-03-16 VITALS — Temp 98.1°F

## 2019-03-16 DIAGNOSIS — B351 Tinea unguium: Secondary | ICD-10-CM

## 2019-03-16 DIAGNOSIS — M79674 Pain in right toe(s): Secondary | ICD-10-CM | POA: Diagnosis not present

## 2019-03-16 DIAGNOSIS — E1165 Type 2 diabetes mellitus with hyperglycemia: Secondary | ICD-10-CM | POA: Diagnosis not present

## 2019-03-16 DIAGNOSIS — M79675 Pain in left toe(s): Secondary | ICD-10-CM

## 2019-03-16 DIAGNOSIS — E1142 Type 2 diabetes mellitus with diabetic polyneuropathy: Secondary | ICD-10-CM

## 2019-03-16 NOTE — Telephone Encounter (Signed)
erro  neous encounter

## 2019-03-16 NOTE — Progress Notes (Signed)
This patient presents to the office with chief complaint of long thick nails and diabetic feet.  This patient  says there  is  no pain and discomfort in his  feet.  This patient says there are long thick painful nails.  These nails are painful walking and wearing shoes.  Patient has no history of infection or drainage from both feet.  Patient is unable to  self treat his own nails . This patient presents  to the office today for treatment of the  long nails and a foot evaluation due to history of  Diabetes. Patient has his CVA which leads muscle weakness left fide.  He walks with a brace.  Patient has healing blister on bottom of left foot.  General Appearance  Alert, conversant and in no acute stress.  Vascular  Dorsalis pedis and posterior tibial  pulses are palpable  bilaterally.  Capillary return is within normal limits  bilaterally. Temperature is within normal limits  bilaterally.  Neurologic  Senn-Weinstein monofilament wire test within normal limits  bilaterally. Muscle power within normal limits bilaterally.  Nails Thick disfigured discolored nails with subungual debris  from hallux to fifth toes bilaterally. No evidence of bacterial infection or drainage bilaterally.  Orthopedic  No limitations of motion of motion feet .  No crepitus or effusions noted.  Hallux limitus  B/L.  Skin  normotropic skin with no porokeratosis noted bilaterally.  No signs of infections or ulcers noted.   Scar tissue 1st MPJ  Left foot.  Onychomycosis  Diabetes with no foot complications  Debride nails  X 10.  RTC 3 months.   Gardiner Barefoot DPM

## 2019-03-17 DIAGNOSIS — E1165 Type 2 diabetes mellitus with hyperglycemia: Secondary | ICD-10-CM | POA: Diagnosis not present

## 2019-03-18 DIAGNOSIS — E1165 Type 2 diabetes mellitus with hyperglycemia: Secondary | ICD-10-CM | POA: Diagnosis not present

## 2019-03-19 DIAGNOSIS — E1165 Type 2 diabetes mellitus with hyperglycemia: Secondary | ICD-10-CM | POA: Diagnosis not present

## 2019-03-20 DIAGNOSIS — E1165 Type 2 diabetes mellitus with hyperglycemia: Secondary | ICD-10-CM | POA: Diagnosis not present

## 2019-03-21 ENCOUNTER — Ambulatory Visit: Payer: Medicaid Other | Attending: Family Medicine | Admitting: Physical Therapy

## 2019-03-21 ENCOUNTER — Other Ambulatory Visit: Payer: Self-pay

## 2019-03-21 ENCOUNTER — Encounter: Payer: Self-pay | Admitting: Physical Therapy

## 2019-03-21 DIAGNOSIS — I6381 Other cerebral infarction due to occlusion or stenosis of small artery: Secondary | ICD-10-CM

## 2019-03-21 DIAGNOSIS — R278 Other lack of coordination: Secondary | ICD-10-CM

## 2019-03-21 DIAGNOSIS — I69354 Hemiplegia and hemiparesis following cerebral infarction affecting left non-dominant side: Secondary | ICD-10-CM | POA: Insufficient documentation

## 2019-03-21 DIAGNOSIS — M6281 Muscle weakness (generalized): Secondary | ICD-10-CM | POA: Insufficient documentation

## 2019-03-21 DIAGNOSIS — M79602 Pain in left arm: Secondary | ICD-10-CM | POA: Insufficient documentation

## 2019-03-21 DIAGNOSIS — R262 Difficulty in walking, not elsewhere classified: Secondary | ICD-10-CM | POA: Insufficient documentation

## 2019-03-21 DIAGNOSIS — E1165 Type 2 diabetes mellitus with hyperglycemia: Secondary | ICD-10-CM | POA: Diagnosis not present

## 2019-03-21 NOTE — Therapy (Signed)
[facility] [address]. [place], [place], [address] Phone: [phone]   Fax:  [phone]  Physical Therapy Treatment  Patient Details  Name: Evan Townsend Sr. MRN: [id number] Date of Birth: 1956/04/05 Referring Provider (PT): Raelyn Ensign   Encounter Date: 03/21/2019  PT End of Session - 03/21/19 1021    Visit Number  12    Number of Visits  24    Date for PT Re-Evaluation  05/09/19    Authorization Type  CCME cert through 7/42 8PT visits    PT Start Time  1012    PT Stop Time  1057    PT Time Calculation (min)  45 min    Equipment Utilized During Treatment  Gait belt    Activity Tolerance  Patient tolerated treatment well    Behavior During Therapy  WFL for tasks assessed/performed       Past Medical History:  Diagnosis Date  . Acute CVA (cerebrovascular accident) ([place]) 11/10/2016  . Diabetes 1.5, managed as type 2 ([place])   . Diabetes mellitus without complication ([place])   . Hypercholesterolemia   . Hypertension   . Pain in both feet   . Stroke [facility])     History reviewed. No pertinent surgical history.  There were no vitals filed for this visit.  Subjective Assessment - 03/21/19 1017    Subjective  Patient notes that he had a nice weekend. He saw his podiatrist last week and there was nothing of concern from that visit. He states that he has been trying to use a dumbbell at home to strengthen his LUE.       TREATMENT  Neuromuscular Re-education: UBE x3 min fwd/ x3 min bwd for improved elbow extension and periscapular muscle activation without trunk compensations Ambulating in clinic with SPC, with AFO, no VCs for increased hip flexion to improve LLE foot clearance Controlled articular rotations at fingers, wrist, and elbow passive faded to active assisted for improved joint mobility and to increase afferent input x10 each joint each direction L supination stretch x3 min with pressure at thenar eminence for  decreased spasticity in LUE L thumb extension 3x8 AROM, later reps [facility] against light resistance L forefinger extension passive range hold x10  L wrist extension passive range hold with PT support x 10 (improved ability to maintain position when support removed) L wrist ulnar and radial deviation with 2# weight, elbow supported x15 each Seated L hip flexion with maintained L elbow extension x10 Seated L hamstring stretch x2 min   Patient educated throughout session on appropriate technique and form using multi-modal cueing, HEP, and activity modification. Patient articulated understanding and returned demonstration.  Patient Response to interventions: Patient reporting improved ROM and decreased stiffness  ASSESSMENT Patient presents to clinic with continued excellent motivation to participate in therapy. Patient demonstrates deficits in LUE function and LLE function and as evidenced by lack of full AROM at LUE and LLE, continued benefit from tactile cueing to encourage appropriate coordination and muscle activation of LUE. At last visit, patient demonstrating improved balance and improved LUE use/function (as evidenced by Merrilee Jansky and Fugl-Myer) and today, demonstrates improved ability to control compensatory patterns at the trunk when using LUE. Patient's condition has the potential to improve in response to therapy. Maximum improvement is yet to be obtained. The anticipated improvement is attainable and reasonable in a generally predictable time. Patient will benefit from continued skilled therapeutic intervention to address remaining deficits in LLE and LUE function, LLE  and LUE AROM, LLE and LUE strength and coordination in order to optimize independence, increase function, and improve overall QOL.   PT Short Term Goals - 03/21/19 1251      PT SHORT TERM GOAL #1   Title  Patient will be independent with HEP for improved therapeutic gains and self-management.    Baseline  IE: not initiated;  02/16/2019: performign hand exercises independently    Time  4    Period  Weeks    Status  Achieved    Target Date  03/02/19        PT Long Term Goals - 03/21/19 1252      PT LONG TERM GOAL #1   Title  Pt will improve BERG by at least 3 points in order to demonstrate clinically significant improvement in balance.    Baseline  IE: 37/56; 03/14/2019: 46/56    Time  8    Period  Weeks    Status  On-going    Target Date  05/09/19      PT LONG TERM GOAL #2   Title  Pt will decrease 5TSTS by at least 3 seconds in order to demonstrate clinically significant improvement in LE strength.    Baseline  IE: unable to collect 2/2 to time limitations; 02/09/2019: 17.9 sec; 03/14/2019: 17.1 sec    Time  8    Period  Weeks    Status  On-going    Target Date  05/09/19      PT LONG TERM GOAL #3   Title  Patient will demonstrate improved LUE function as evidenced by an improvement of at least 5.2 points on the Fugl-Meyer UE assessment in order to improved independence.    Baseline  IE: not evaluated 2/2 to time; 02/07/2019: 29/66 (A: 13/36; B:4/10; C:9/14; D:3/6) indicative of severe-moderate dysfunction; 03/14/2019: 29/66 (A: 23/36; B:5/10; C:12/14; D:3/6) indicative of moderate dysfunction    Time  8    Period  Weeks    Status  On-going    Target Date  05/09/19      PT LONG TERM GOAL #4   Title  Patient will demonstrate a gait speed of >0.8 m/s with SPC (or at minimum a 0.16 m/s improvement for MCID) (w/ or w/o AFO) as indicated by 10 MWT  in order to ensure safe community ambulation.    Baseline  03/14/2019: 0.2471m/s limited community ambulator    Time  8    Period  Weeks    Status  New    Target Date  05/09/19            Plan - 03/21/19 1058    Clinical Impression Statement  Patient presents to clinic with continued excellent motivation to participate in therapy. Patient demonstrates deficits in LUE function and LLE function and as evidenced by lack of full AROM at LUE and LLE, continued  benefit from tactile cueing to encourage appropriate coordination and muscle activation of LUE. At last visit, patient demonstrating improved balance and improved LUE use/function (as evidenced by Sharlene MottsBerg and Fugl-Myer) and today, demonstrates improved ability to control compensatory patterns at the trunk when using LUE. Patient's condition has the potential to improve in response to therapy. Maximum improvement is yet to be obtained. The anticipated improvement is attainable and reasonable in a generally predictable time. Patient will benefit from continued skilled therapeutic intervention to address remaining deficits in LLE and LUE function, LLE and LUE AROM, LLE and LUE strength and coordination in order to optimize independence, increase  function, and improve overall QOL    Personal Factors and Comorbidities  Age;Education;Sex;Social Background;Behavior Pattern;Finances;Past/Current Experience;Time since onset of injury/illness/exacerbation    Examination-Activity Limitations  Bathing;Bed Mobility;Dressing;Transfers;Squat;Lift;Locomotion Level;Stairs;Reach Overhead;Carry;Stand    Examination-Participation Restrictions  Interpersonal Relationship;Yard Work;Laundry;Cleaning;Community Activity;Meal Prep;Medication Management    Stability/Clinical Decision Making  Evolving/Moderate complexity    Rehab Potential  Good    PT Frequency  2x / week    PT Duration  8 weeks    PT Treatment/Interventions  ADLs/Self Care Home Management;Aquatic Therapy;Moist Heat;Cryotherapy;Gait training;Stair training;Functional mobility training;Therapeutic activities;Therapeutic exercise;Patient/family education;Neuromuscular re-education;Balance training;Orthotic Fit/Training;Manual techniques;Passive range of motion;Dry needling;Taping;Energy conservation;Splinting;Joint Manipulations;Spinal Manipulations    PT Next Visit Plan  continue UBE for NME, CKC finger extension    PT Home Exercise Plan  pincer grasp and release,  spherical grasp and release    Consulted and Agree with Plan of Care  Patient       Patient will benefit from skilled therapeutic intervention in order to improve the following deficits and impairments:  Abnormal gait, Decreased balance, Decreased endurance, Decreased mobility, Difficulty walking, Hypomobility, Increased muscle spasms, Impaired tone, Decreased range of motion, Decreased coordination, Decreased strength, Impaired UE functional use, Pain  Visit Diagnosis: 1. Hemiplegia and hemiparesis following cerebral infarction affecting left non-dominant side (HCC)   2. Lacunar infarct, acute (HCC)   3. Other lack of coordination   4. Muscle weakness (generalized)   5. Difficulty in walking, not elsewhere classified   6. Pain in left arm        Problem List Patient Active Problem List   Diagnosis Date Noted  . Hemiplegia and hemiparesis following cerebral infarction affecting left non-dominant side (HCC) 11/25/2018  . 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 RBBB 10/15/2018  . Sinus bradycardia 10/15/2018  . Dysphagia, post-stroke   . Neuropathic pain   . Lacunar infarct, acute (HCC) 09/06/2018  . TIA (transient ischemic attack) 09/01/2018  . Microalbuminuria 07/29/2018  . Left leg pain 01/13/2018  . Hyperlipidemia 02/11/2017  . Essential hypertension 11/10/2016  . Diabetes (HCC) 11/10/2016  . Tobacco abuse 08/28/2013   Sheria LangKatlin Tamea Bai PT, DPT [phone] 03/21/2019, 1:02 PM  [facility] [address] [place], [place], [address] Phone: [phone]   Fax:  [phone]  Name: Sandie Anouncan E Bukowski Sr. MRN: [id number] Date of Birth: 03/11/1956

## 2019-03-22 DIAGNOSIS — E1165 Type 2 diabetes mellitus with hyperglycemia: Secondary | ICD-10-CM | POA: Diagnosis not present

## 2019-03-23 ENCOUNTER — Other Ambulatory Visit: Payer: Self-pay

## 2019-03-23 ENCOUNTER — Ambulatory Visit: Payer: Medicaid Other | Admitting: Physical Therapy

## 2019-03-23 ENCOUNTER — Encounter: Payer: Self-pay | Admitting: Physical Therapy

## 2019-03-23 DIAGNOSIS — M79602 Pain in left arm: Secondary | ICD-10-CM

## 2019-03-23 DIAGNOSIS — E1165 Type 2 diabetes mellitus with hyperglycemia: Secondary | ICD-10-CM | POA: Diagnosis not present

## 2019-03-23 DIAGNOSIS — Z596 Low income: Secondary | ICD-10-CM | POA: Diagnosis not present

## 2019-03-23 DIAGNOSIS — R262 Difficulty in walking, not elsewhere classified: Secondary | ICD-10-CM

## 2019-03-23 DIAGNOSIS — I69354 Hemiplegia and hemiparesis following cerebral infarction affecting left non-dominant side: Secondary | ICD-10-CM

## 2019-03-23 DIAGNOSIS — I6381 Other cerebral infarction due to occlusion or stenosis of small artery: Secondary | ICD-10-CM

## 2019-03-23 DIAGNOSIS — M6281 Muscle weakness (generalized): Secondary | ICD-10-CM

## 2019-03-23 DIAGNOSIS — R278 Other lack of coordination: Secondary | ICD-10-CM

## 2019-03-23 NOTE — Therapy (Signed)
[facility] [address] [place], [place], [address] Phone: [phone]   Fax:  [phone]  Physical Therapy Treatment  Patient Details  Name: Evan POIRIER Sr. MRN: [id number] Date of Birth: 1955/10/24 Referring Provider (PT): Raelyn Ensign   Encounter Date: 03/23/2019  PT End of Session - 03/23/19 0934    Visit Number  13    Number of Visits  24    Date for PT Re-Evaluation  05/09/19    Authorization Type  CCME cert through 2/22 8PT visits    PT Start Time  0930    PT Stop Time  1010    PT Time Calculation (min)  40 min    Equipment Utilized During Treatment  Gait belt    Activity Tolerance  Patient tolerated treatment well    Behavior During Therapy  WFL for tasks assessed/performed       Past Medical History:  Diagnosis Date  . Acute CVA (cerebrovascular accident) ([place]) 11/10/2016  . Diabetes 1.5, managed as type 2 ([place])   . Diabetes mellitus without complication ([place])   . Hypercholesterolemia   . Hypertension   . Pain in both feet   . Stroke [facility])     History reviewed. No pertinent surgical history.  There were no vitals filed for this visit.  Subjective Assessment - 03/23/19 1015    Subjective  Patient states he is noticing his ability to see his L palm more and is better able to open his hadn to let go of things. Patient reports waking up very early this morning and not able to get back to sleep.       TREATMENT  Neuromuscular Re-education: UBE x3 min fwd/ x3 min bwd for improved elbow extension and periscapular muscle activation without trunk compensations Ambulating in clinic with SPC, with AFO, VCs for increased hip flexion to improve LLE foot clearance. Patient demonstrating some components of heel and toe rocker.  Stairs with BUE support, RLE lead, LLE hip hike for ascension, x6  Stairs with BUE support, LLE lead, foot flat contact for descent, x6 Ambulating in parking lot, SPC, uneven surface, 2x100  feet with VCs for increased LLE hip flexion, noted L genu recurvatum in stance. L supination stretch 3x3 min with pressure at thenar eminence for decreased spasticity in LUE Combined synergies: supination, elbow extension, shoulder flexion, assisted, 2x5 L global hand extension x10, AROM L wrist ulnar and radial deviation in supported neutral, AAROM faded to AROM, x10 each   Patient educated throughout session on appropriate technique and form using multi-modal cueing, HEP, and activity modification. Patient articulated understanding and returned demonstration.  Patient Response to interventions: Patient reporting no increased pain or stiffness  ASSESSMENT Patient presents to clinic with continued excellent motivation to participate in therapy. Patient demonstrates deficits in LUE function and LLE function and as evidenced by lack of full AROM at LUE and LLE, continued benefit from tactile cueing to encourage appropriate coordination and muscle activation of LUE. Patient able to hold 75% of full supination ROM on LUE after passive stretch with maintained finger extension during today's session. Patient will benefit from continued skilled therapeutic intervention to address remaining deficits in LLE and LUE function, LLE and LUE AROM, LLE and LUE strength and coordination in order to optimize independence, increase function, and improve overall QOL.    PT Short Term Goals - 03/21/19 1251      PT SHORT TERM GOAL #1   Title  Patient will be  independent with HEP for improved therapeutic gains and self-management.    Baseline  IE: not initiated; 02/16/2019: performign hand exercises independently    Time  4    Period  Weeks    Status  Achieved    Target Date  03/02/19        PT Long Term Goals - 03/21/19 1252      PT LONG TERM GOAL #1   Title  Pt will improve BERG by at least 3 points in order to demonstrate clinically significant improvement in balance.    Baseline  IE: 37/56; 03/14/2019:  46/56    Time  8    Period  Weeks    Status  On-going    Target Date  05/09/19      PT LONG TERM GOAL #2   Title  Pt will decrease 5TSTS by at least 3 seconds in order to demonstrate clinically significant improvement in LE strength.    Baseline  IE: unable to collect 2/2 to time limitations; 02/09/2019: 17.9 sec; 03/14/2019: 17.1 sec    Time  8    Period  Weeks    Status  On-going    Target Date  05/09/19      PT LONG TERM GOAL #3   Title  Patient will demonstrate improved LUE function as evidenced by an improvement of at least 5.2 points on the Fugl-Meyer UE assessment in order to improved independence.    Baseline  IE: not evaluated 2/2 to time; 02/07/2019: 29/66 (A: 13/36; B:4/10; C:9/14; D:3/6) indicative of severe-moderate dysfunction; 03/14/2019: 29/66 (A: 23/36; B:5/10; C:12/14; D:3/6) indicative of moderate dysfunction    Time  8    Period  Weeks    Status  On-going    Target Date  05/09/19      PT LONG TERM GOAL #4   Title  Patient will demonstrate a gait speed of >0.8 m/s with SPC (or at minimum a 0.16 m/s improvement for MCID) (w/ or w/o AFO) as indicated by 10 MWT  in order to ensure safe community ambulation.    Baseline  03/14/2019: 0.413m/s limited community ambulator    Time  8    Period  Weeks    Status  New    Target Date  05/09/19            Plan - 03/23/19 1013    Clinical Impression Statement  Patient presents to clinic with continued excellent motivation to participate in therapy. Patient demonstrates deficits in LUE function and LLE function and as evidenced by lack of full AROM at LUE and LLE, continued benefit from tactile cueing to encourage appropriate coordination and muscle activation of LUE. Patient able to hold 75% of full supination ROM on LUE after passive stretch with maintained finger extension during today's session. Patient will benefit from continued skilled therapeutic intervention to address remaining deficits in LLE and LUE function, LLE and  LUE AROM, LLE and LUE strength and coordination in order to optimize independence, increase function, and improve overall QOL.    Personal Factors and Comorbidities  Age;Education;Sex;Social Background;Behavior Pattern;Finances;Past/Current Experience;Time since onset of injury/illness/exacerbation    Examination-Activity Limitations  Bathing;Bed Mobility;Dressing;Transfers;Squat;Lift;Locomotion Level;Stairs;Reach Overhead;Carry;Stand    Examination-Participation Restrictions  Interpersonal Relationship;Yard Work;Laundry;Cleaning;Community Activity;Meal Prep;Medication Management    Stability/Clinical Decision Making  Evolving/Moderate complexity    Rehab Potential  Good    PT Frequency  2x / week    PT Duration  8 weeks    PT Treatment/Interventions  ADLs/Self Care Home Management;Aquatic Therapy;Moist  Heat;Cryotherapy;Gait training;Stair training;Functional mobility training;Therapeutic activities;Therapeutic exercise;Patient/family education;Neuromuscular re-education;Balance training;Orthotic Fit/Training;Manual techniques;Passive range of motion;Dry needling;Taping;Energy conservation;Splinting;Joint Manipulations;Spinal Manipulations    PT Next Visit Plan  continue UBE for NME, CKC finger extension    PT Home Exercise Plan  pincer grasp and release, spherical grasp and release    Consulted and Agree with Plan of Care  Patient       Patient will benefit from skilled therapeutic intervention in order to improve the following deficits and impairments:  Abnormal gait, Decreased balance, Decreased endurance, Decreased mobility, Difficulty walking, Hypomobility, Increased muscle spasms, Impaired tone, Decreased range of motion, Decreased coordination, Decreased strength, Impaired UE functional use, Pain  Visit Diagnosis: 1. Hemiplegia and hemiparesis following cerebral infarction affecting left non-dominant side (HCC)   2. Pain in left arm   3. Lacunar infarct, acute (HCC)   4. Other lack of  coordination   5. Difficulty in walking, not elsewhere classified   6. Muscle weakness (generalized)        Problem List Patient Active Problem List   Diagnosis Date Noted  . Hemiplegia and hemiparesis following cerebral infarction affecting left non-dominant side (HCC) 11/25/2018  . 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 RBBB 10/15/2018  . Sinus bradycardia 10/15/2018  . Dysphagia, post-stroke   . Neuropathic pain   . Lacunar infarct, acute (HCC) 09/06/2018  . TIA (transient ischemic attack) 09/01/2018  . Microalbuminuria 07/29/2018  . Left leg pain 01/13/2018  . Hyperlipidemia 02/11/2017  . Essential hypertension 11/10/2016  . Diabetes (HCC) 11/10/2016  . Tobacco abuse 08/28/2013   Sheria LangKatlin Isaiah Cianci PT, DPT [phone] 03/23/2019, 10:37 AM  [facility] [address] [place], [place], [address] Phone: [phone]   Fax:  [phone]  Name: Evan Anouncan E South Sr. MRN: [id number] Date of Birth: 03/11/1956

## 2019-03-28 ENCOUNTER — Encounter: Payer: Medicaid Other | Admitting: Physical Therapy

## 2019-03-28 ENCOUNTER — Telehealth: Payer: Self-pay | Admitting: Family Medicine

## 2019-03-28 NOTE — Telephone Encounter (Signed)
Medication Refill - Medication: glipiZIDE (GLUCOTROL) 5 MG tablet ,tiZANidine (ZANAFLEX) 2 MG tablet,famotidine (PEPCID) 20 MG tablet(Patient stated that pharmacy has tried reaching out multiple times to office)      Has the patient contacted their pharmacy? Yes (Agent: If no, request that the patient contact the pharmacy for the refill.) (Agent: If yes, when and what did the pharmacy advise?)Contact PCP  Preferred Pharmacy (with phone number or street name): [facility] [place] [facility] [place] [phone] (Phone) [phone] (Fax)     Agent: Please be advised that RX refills may take up to 3 business days. We ask that you follow-up with your pharmacy.

## 2019-03-29 ENCOUNTER — Other Ambulatory Visit: Payer: Self-pay | Admitting: Family Medicine

## 2019-03-29 ENCOUNTER — Other Ambulatory Visit: Payer: Self-pay | Admitting: Emergency Medicine

## 2019-03-29 DIAGNOSIS — M79605 Pain in left leg: Secondary | ICD-10-CM

## 2019-03-29 DIAGNOSIS — Z794 Long term (current) use of insulin: Secondary | ICD-10-CM

## 2019-03-29 DIAGNOSIS — M542 Cervicalgia: Secondary | ICD-10-CM

## 2019-03-29 DIAGNOSIS — M792 Neuralgia and neuritis, unspecified: Secondary | ICD-10-CM

## 2019-03-29 DIAGNOSIS — E1165 Type 2 diabetes mellitus with hyperglycemia: Secondary | ICD-10-CM | POA: Diagnosis not present

## 2019-03-29 DIAGNOSIS — K219 Gastro-esophageal reflux disease without esophagitis: Secondary | ICD-10-CM

## 2019-03-29 DIAGNOSIS — E1159 Type 2 diabetes mellitus with other circulatory complications: Secondary | ICD-10-CM

## 2019-03-29 MED ORDER — TIZANIDINE HCL 2 MG PO TABS: 2.0000 mg | ORAL_TABLET | Freq: Every evening | ORAL | 0 refills | Status: DC | PRN

## 2019-03-29 MED ORDER — PANTOPRAZOLE SODIUM 20 MG PO TBEC: 20.0000 mg | DELAYED_RELEASE_TABLET | Freq: Two times a day (BID) | ORAL | 0 refills | Status: DC

## 2019-03-29 MED ORDER — GLIPIZIDE 5 MG PO TABS: 5.0000 mg | ORAL_TABLET | Freq: Every day | ORAL | 1 refills | Status: DC

## 2019-03-29 NOTE — Telephone Encounter (Signed)
All orders are pend fore refills. Did not see pepcid but saw Protonix

## 2019-03-29 NOTE — Telephone Encounter (Signed)
Refills provided - please make sure follow up is scheduled.

## 2019-03-30 ENCOUNTER — Encounter: Payer: Medicaid Other | Admitting: Physical Therapy

## 2019-03-30 DIAGNOSIS — E1165 Type 2 diabetes mellitus with hyperglycemia: Secondary | ICD-10-CM | POA: Diagnosis not present

## 2019-03-30 NOTE — Telephone Encounter (Signed)
please call and schedule patient appointment

## 2019-03-31 DIAGNOSIS — E1165 Type 2 diabetes mellitus with hyperglycemia: Secondary | ICD-10-CM | POA: Diagnosis not present

## 2019-04-01 DIAGNOSIS — E1165 Type 2 diabetes mellitus with hyperglycemia: Secondary | ICD-10-CM | POA: Diagnosis not present

## 2019-04-02 DIAGNOSIS — E1165 Type 2 diabetes mellitus with hyperglycemia: Secondary | ICD-10-CM | POA: Diagnosis not present

## 2019-04-03 DIAGNOSIS — E1165 Type 2 diabetes mellitus with hyperglycemia: Secondary | ICD-10-CM | POA: Diagnosis not present

## 2019-04-03 NOTE — Telephone Encounter (Signed)
appt scheduled for 8.28.2020

## 2019-04-04 ENCOUNTER — Encounter: Payer: Medicaid Other | Admitting: Physical Therapy

## 2019-04-04 DIAGNOSIS — E1165 Type 2 diabetes mellitus with hyperglycemia: Secondary | ICD-10-CM | POA: Diagnosis not present

## 2019-04-05 DIAGNOSIS — E1165 Type 2 diabetes mellitus with hyperglycemia: Secondary | ICD-10-CM | POA: Diagnosis not present

## 2019-04-06 ENCOUNTER — Encounter: Payer: Medicaid Other | Admitting: Physical Therapy

## 2019-04-06 DIAGNOSIS — E1165 Type 2 diabetes mellitus with hyperglycemia: Secondary | ICD-10-CM | POA: Diagnosis not present

## 2019-04-07 DIAGNOSIS — E1165 Type 2 diabetes mellitus with hyperglycemia: Secondary | ICD-10-CM | POA: Diagnosis not present

## 2019-04-08 DIAGNOSIS — E1165 Type 2 diabetes mellitus with hyperglycemia: Secondary | ICD-10-CM | POA: Diagnosis not present

## 2019-04-09 DIAGNOSIS — E1165 Type 2 diabetes mellitus with hyperglycemia: Secondary | ICD-10-CM | POA: Diagnosis not present

## 2019-04-10 DIAGNOSIS — E1165 Type 2 diabetes mellitus with hyperglycemia: Secondary | ICD-10-CM | POA: Diagnosis not present

## 2019-04-11 ENCOUNTER — Encounter: Payer: Medicaid Other | Admitting: Physical Therapy

## 2019-04-11 DIAGNOSIS — E1165 Type 2 diabetes mellitus with hyperglycemia: Secondary | ICD-10-CM | POA: Diagnosis not present

## 2019-04-12 DIAGNOSIS — E1165 Type 2 diabetes mellitus with hyperglycemia: Secondary | ICD-10-CM | POA: Diagnosis not present

## 2019-04-13 ENCOUNTER — Ambulatory Visit: Payer: Self-pay | Admitting: *Deleted

## 2019-04-13 ENCOUNTER — Encounter: Payer: Medicaid Other | Admitting: Physical Therapy

## 2019-04-13 DIAGNOSIS — E1165 Type 2 diabetes mellitus with hyperglycemia: Secondary | ICD-10-CM | POA: Diagnosis not present

## 2019-04-13 DIAGNOSIS — G459 Transient cerebral ischemic attack, unspecified: Secondary | ICD-10-CM

## 2019-04-13 DIAGNOSIS — E113313 Type 2 diabetes mellitus with moderate nonproliferative diabetic retinopathy with macular edema, bilateral: Secondary | ICD-10-CM | POA: Diagnosis not present

## 2019-04-13 DIAGNOSIS — M79605 Pain in left leg: Secondary | ICD-10-CM

## 2019-04-13 NOTE — Chronic Care Management (AMB) (Signed)
  Chronic Care Management   Social Work Note  04/13/2019 Name: Evan KLINKE Sr. MRN: [id number] DOB: [birth year]  Evan Cones Sr. is a 63 y.o. year old male who sees Hubbard Hartshorn, FNP for primary care.   Phone call from patient's spouse requesting a replacement medicaid card.  This social worker advised that she contact the Department of Social Services to request a replacement call. She also had questions regarding the fact Medicaid will not cover any additional physical therapy. This social recommended that she speak to her provider about this.   Outpatient Encounter Medications as of 04/13/2019  Medication Sig  . acetaminophen (TYLENOL) 325 MG tablet Take 2 tablets (650 mg total) by mouth every 4 (four) hours as needed for mild pain (or temp > 37.5 C (99.5 F)).  Marland Kitchen amLODipine (NORVASC) 10 MG tablet Take 10 mg by mouth daily.  Marland Kitchen aspirin 81 MG EC tablet Take 1 tablet (81 mg total) by mouth daily.  Marland Kitchen atorvastatin (LIPITOR) 80 MG tablet Take 1 tablet (80 mg total) by mouth every evening.  . clopidogrel (PLAVIX) 75 MG tablet Take 1 tablet (75 mg total) by mouth daily.  Marland Kitchen gabapentin (NEURONTIN) 300 MG capsule Take 2 capsules (600 mg total) by mouth 2 (two) times daily.  Marland Kitchen glipiZIDE (GLUCOTROL) 5 MG tablet Take 1 tablet (5 mg total) by mouth daily before breakfast.  . Insulin Detemir (LEVEMIR) 100 UNIT/ML Pen Inject 20 Units into the skin daily.  Marland Kitchen lisinopril (PRINIVIL,ZESTRIL) 40 MG tablet Take 1 tablet (40 mg total) by mouth daily.  . nicotine (NICODERM CQ - DOSED IN MG/24 HOURS) 14 mg/24hr patch 14 mg patch daily 2 weeks then 7 mg patch daily 3 weeks and stop  . pantoprazole (PROTONIX) 20 MG tablet Take 1 tablet (20 mg total) by mouth 2 (two) times daily.  Marland Kitchen tiZANidine (ZANAFLEX) 2 MG tablet Take 1 tablet (2 mg total) by mouth at bedtime as needed for muscle spasms (neck pain).   No facility-administered encounter medications on file as of 04/13/2019.     Goals Addressed   None     Follow Up Plan: Client's spouse will contact the Department of Social Services to request a replacement medicaid card  [place], [place] Worker  [facility] Care [facility] [phone]

## 2019-04-14 ENCOUNTER — Encounter: Payer: Self-pay | Admitting: Family Medicine

## 2019-04-14 ENCOUNTER — Other Ambulatory Visit: Payer: Self-pay

## 2019-04-14 ENCOUNTER — Ambulatory Visit: Payer: Medicaid Other | Admitting: Family Medicine

## 2019-04-14 VITALS — BP 142/84 | HR 81 | Temp 97.9°F | Resp 16 | Ht 65.0 in | Wt 155.1 lb

## 2019-04-14 DIAGNOSIS — M792 Neuralgia and neuritis, unspecified: Secondary | ICD-10-CM

## 2019-04-14 DIAGNOSIS — R351 Nocturia: Secondary | ICD-10-CM | POA: Diagnosis not present

## 2019-04-14 DIAGNOSIS — E785 Hyperlipidemia, unspecified: Secondary | ICD-10-CM | POA: Diagnosis not present

## 2019-04-14 DIAGNOSIS — K2100 Gastro-esophageal reflux disease with esophagitis, without bleeding: Secondary | ICD-10-CM

## 2019-04-14 DIAGNOSIS — M542 Cervicalgia: Secondary | ICD-10-CM | POA: Diagnosis not present

## 2019-04-14 DIAGNOSIS — E1159 Type 2 diabetes mellitus with other circulatory complications: Secondary | ICD-10-CM

## 2019-04-14 DIAGNOSIS — I10 Essential (primary) hypertension: Secondary | ICD-10-CM

## 2019-04-14 DIAGNOSIS — E1165 Type 2 diabetes mellitus with hyperglycemia: Secondary | ICD-10-CM | POA: Diagnosis not present

## 2019-04-14 DIAGNOSIS — I69354 Hemiplegia and hemiparesis following cerebral infarction affecting left non-dominant side: Secondary | ICD-10-CM | POA: Diagnosis not present

## 2019-04-14 DIAGNOSIS — M79605 Pain in left leg: Secondary | ICD-10-CM | POA: Diagnosis not present

## 2019-04-14 DIAGNOSIS — Z794 Long term (current) use of insulin: Secondary | ICD-10-CM | POA: Diagnosis not present

## 2019-04-14 MED ORDER — TIZANIDINE HCL 2 MG PO TABS: 2.0000 mg | ORAL_TABLET | Freq: Every evening | ORAL | 0 refills | Status: DC | PRN

## 2019-04-14 MED ORDER — AMLODIPINE BESYLATE 10 MG PO TABS: 10.0000 mg | ORAL_TABLET | Freq: Every day | ORAL | 3 refills | Status: DC

## 2019-04-14 MED ORDER — HYDROCHLOROTHIAZIDE 12.5 MG PO TABS: 12.5000 mg | ORAL_TABLET | ORAL | 3 refills | Status: DC

## 2019-04-14 NOTE — Patient Instructions (Signed)
Increase your daily insulin to 24 units. Start taking hydrochlorothiazide 12.5mg  once daily in the morning to help with blood pressure.

## 2019-04-14 NOTE — Addendum Note (Signed)
Addended by: Inda Coke on: 04/14/2019 11:04 AM   Modules accepted: Orders

## 2019-04-14 NOTE — Progress Notes (Signed)
Name: Evan AnoDuncan E Stansell Sr.   MRN: [id number]    DOB: 03/11/1956   Date:04/14/2019       Progress Note  Subjective  Chief Complaint  Chief Complaint  Patient presents with  . Hyperlipidemia    follow up, medication refill  . Hypertension  . Diabetes    HPI  HTN: Taking amlodipine 10mg , Lisinopril 40mg , BP elevated initially today. Denies chest pain, shortness of breath, headaches, blurred vision. We will add HCTZ 12.5mg  to gain better control of BP.  HLD: Taking atorvastatin daily, no chest pain, shortness of breath, or myalgias. Unchanged, stable.  Hx Stroke/LEFT Hemiplegia:Stroke in January 2019, affecting LEFT side function; wearing brace on the LEFT LE.Seeing Dr. Pearlean BrownieSethi with neurology - last visit was 11/10/2018 - taking Plavix and 81mg  ASA, on statin. Stable since last check.  No signs or symptoms of bleeding.  - LEFT Ankle Swelling: On side that he had a stroke. Has had mild intermittent swelling for several months, goes away overnight with elevation, wearing leg brace during the day. No tenderness or erythema. - LEFT Hand Pain: Feels tight like it is swollen, even though it is not. He is taking gabapentin, there is no swelling in the upper arm.  - Did PT for both the ankle and the hand just after his stroke, but it did not seem to help at the time, but he would like to try again - we will refer again today.    Neck and LEFT shoulder pain: Has intermittent flares; current flare ongoing for about a month.  He thinks he sleeps on it wrong sometimes and this causes the pain.  He does have late effects of stroke on the LEFT side, muscles are tense and somewhat tender on the trapezius.  Has baseline weakness on the LEFT upper extremity. Tizanidine did help last time, will provide refill today. He also really wants to try PT.   Indigestion: He notes for the last several months he has burping and regurgitation with sour taste.  Trialed 90 days of 20mg  Protonix BID and this did not  help.  Would like GI referral today which we will provide. He denies vomiting, blood in stool/dark and tarry stool, difficulty swallowing or abdominal pain. He is taking plavix and 81mg  ASA. He has tried tums and famotidine this has not seemed to help.  Diabetes mellitus type 2 Checking sugars?  yes How often? Daily Range (low to high) over last two weeks:  150's  Trying to limit white bread, white rice, white potatoes, sweets?  yes Trying to limit sweetened drinks like iced tea, soft drinks, sports drinks, fruit juices?  No - drinking pepsi's twice weekly Checking feet every day/night?  yes Last eye exam:  April 2020 Denies: Polyuria, polydipsia, polyphagia, vision changes, or neuropathy. Most recent A1C: Due for recheck in 3 weeks. Last CMP Results : is due for repeat today Urine Micro UTD? Yes Current Medication Management: Diabetic Medications: He was told to increase insulin to 24 units, but has still been doing 20 units.  Taking glipizide 5mg  daily.  Has taken metformin but gave him significant diarrhea. No family history of thyroid cancer or personal history of thyroid cancer or pancreatitis. Will consider GLP-1 therapy after next A1C check.  ACEI/ARB: Yes Statin: Yes Aspirin therapy: Yes    Patient Active Problem List   Diagnosis Date Noted  . Hemiplegia and hemiparesis following cerebral infarction affecting left non-dominant side (HCC) 11/25/2018  . 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 RBBB 10/15/2018  .  Sinus bradycardia 10/15/2018  . Dysphagia, post-stroke   . Neuropathic pain   . Lacunar infarct, acute (HCC) 09/06/2018  . TIA (transient ischemic attack) 09/01/2018  . Microalbuminuria 07/29/2018  . Left leg pain 01/13/2018  . Hyperlipidemia 02/11/2017  . Essential hypertension 11/10/2016  . Diabetes (HCC) 11/10/2016  . Tobacco abuse 08/28/2013    No past surgical history on file.  Family History  Problem Relation Age of Onset  . Hypertension Mother   . Hypertension  Father   . Diabetes Father     Social History   Socioeconomic History  . Marital status: Married    Spouse name: Solicitor  . Number of children: 5  . Years of education: Not on file  . Highest education level: Not on file  Occupational History  . Occupation: unemployed  Social Needs  . Financial resource strain: Very hard  . Food insecurity    Worry: Never true    Inability: Never true  . Transportation needs    Medical: No    Non-medical: No  Tobacco Use  . Smoking status: Former Smoker    Types: Cigarettes    Start date: 1982  . Smokeless tobacco: Never Used  Substance and Sexual Activity  . Alcohol use: Yes    Alcohol/week: 1.0 standard drinks    Types: 1 Cans of beer per week  . Drug use: No  . Sexual activity: Yes  Lifestyle  . Physical activity    Days per week: 0 days    Minutes per session: Not on file  . Stress: Only a little  Relationships  . Social connections    Talks on phone: More than three times a week    Gets together: Once a week    Attends religious service: More than 4 times per year    Active member of club or organization: No    Attends meetings of clubs or organizations: Never    Relationship status: Married  . Intimate partner violence    Fear of current or ex partner: No    Emotionally abused: No    Physically abused: No    Forced sexual activity: No  Other Topics Concern  . Not on file  Social History Narrative  . Not on file     Current Outpatient Medications:  .  acetaminophen (TYLENOL) 325 MG tablet, Take 2 tablets (650 mg total) by mouth every 4 (four) hours as needed for mild pain (or temp > 37.5 C (99.5 F))., Disp: , Rfl:  .  amLODipine (NORVASC) 10 MG tablet, Take 1 tablet (10 mg total) by mouth daily., Disp: 90 tablet, Rfl: 3 .  aspirin 81 MG EC tablet, Take 1 tablet (81 mg total) by mouth daily., Disp: 30 tablet, Rfl:  .  atorvastatin (LIPITOR) 80 MG tablet, Take 1 tablet (80 mg total) by mouth every evening.,  Disp: 30 tablet, Rfl: 0 .  clopidogrel (PLAVIX) 75 MG tablet, Take 1 tablet (75 mg total) by mouth daily., Disp: 30 tablet, Rfl: 1 .  gabapentin (NEURONTIN) 300 MG capsule, Take 2 capsules (600 mg total) by mouth 2 (two) times daily., Disp: 60 capsule, Rfl: 1 .  glipiZIDE (GLUCOTROL) 5 MG tablet, Take 1 tablet (5 mg total) by mouth daily before breakfast., Disp: 30 tablet, Rfl: 1 .  Insulin Detemir (LEVEMIR) 100 UNIT/ML Pen, Inject 20 Units into the skin daily., Disp: 15 mL, Rfl: 11 .  lisinopril (PRINIVIL,ZESTRIL) 40 MG tablet, Take 1 tablet (40 mg total) by mouth daily.,  Disp: 30 tablet, Rfl: 1 .  pantoprazole (PROTONIX) 20 MG tablet, Take 1 tablet (20 mg total) by mouth 2 (two) times daily., Disp: 180 tablet, Rfl: 0 .  tiZANidine (ZANAFLEX) 2 MG tablet, Take 1 tablet (2 mg total) by mouth at bedtime as needed for muscle spasms (neck pain)., Disp: 30 tablet, Rfl: 0 .  hydrochlorothiazide (HYDRODIURIL) 12.5 MG tablet, Take 1 tablet (12.5 mg total) by mouth every morning., Disp: 90 tablet, Rfl: 3  No Known Allergies  I personally reviewed active problem list, medication list, allergies, health maintenance, notes from last encounter, lab results with the patient/caregiver today.   ROS  Ten systems reviewed and is negative except as mentioned in HPI  Objective  Vitals:   04/14/19 1031 04/14/19 1055  BP: (!) 160/82 (!) 142/84  Pulse: 81   Resp: 16   Temp: 97.9 F (36.6 C)   TempSrc: Temporal   SpO2: 99%   Weight: 155 lb 1.6 oz (70.4 kg)   Height: 5\' 5"  (1.651 m)     Body mass index is 25.81 kg/m.  Physical Exam Constitutional: Patient appears well-developed and well-nourished. No distress.  HENT: Head: Normocephalic and atraumatic. Eyes: Conjunctivae and EOM are normal. No scleral icterus.   Neck: Normal range of motion. Neck supple. No JVD present. No thyromegaly present.  Cardiovascular: Normal rate, regular rhythm and normal heart sounds.  No murmur heard. No BLE edema.  Pulmonary/Chest: Effort normal and breath sounds normal. No respiratory distress. Abdominal: Soft. Bowel sounds are normal, no distension. There is no tenderness. No masses. Musculoskeletal: Normal range of motion, no joint effusions. There is some contracture of the LEFT upper extremity at the elbow and wrist; left upper and lower extremities are weak. Neurological: Pt is alert and oriented to person, place, and time. No cranial nerve deficit. Coordination, balance, strength, speech and gait are baseline for the patient.  Skin: Skin is warm and dry. No rash noted. No erythema.  Psychiatric: Patient has a normal mood and affect. behavior is normal. Judgment and thought content normal.  No results found for this or any previous visit (from the past 72 hour(s)).  PHQ2/9: Depression screen [facility] 2/9 04/14/2019 01/31/2019 11/25/2018  Decreased Interest 0 0 0  Down, Depressed, Hopeless 0 0 0  PHQ - 2 Score 0 0 0  Altered sleeping 0 0 0  Tired, decreased energy 0 0 0  Change in appetite 0 0 0  Feeling bad or failure about yourself  0 0 0  Trouble concentrating 0 0 0  Moving slowly or fidgety/restless 0 0 0  Suicidal thoughts 0 0 0  PHQ-9 Score 0 0 0  Difficult doing work/chores Not difficult at all Not difficult at all Not difficult at all   PHQ-2/9 Result is negative.    Fall Risk: Fall Risk  04/14/2019 01/31/2019 01/31/2019 12/06/2018 11/25/2018  Falls in the past year? 0 0 0 0 0  Number falls in past yr: 0 0 0 - 0  Injury with Fall? 0 0 0 - 0  Follow up - - - - Falls evaluation completed    Assessment & Plan  1. Neck pain on left side - tiZANidine (ZANAFLEX) 2 MG tablet; Take 1 tablet (2 mg total) by mouth at bedtime as needed for muscle spasms (neck pain).  Dispense: 30 tablet; Refill: 0 - Ambulatory referral to Physical Therapy  2. Left leg pain - tiZANidine (ZANAFLEX) 2 MG tablet; Take 1 tablet (2 mg total) by mouth at bedtime as  needed for muscle spasms (neck pain).  Dispense: 30  tablet; Refill: 0 - Ambulatory referral to Physical Therapy  3. Neuropathic pain - tiZANidine (ZANAFLEX) 2 MG tablet; Take 1 tablet (2 mg total) by mouth at bedtime as needed for muscle spasms (neck pain).  Dispense: 30 tablet; Refill: 0 - Ambulatory referral to Physical Therapy - Ambulatory referral to Neurology  4. Hemiplegia and hemiparesis following cerebral infarction affecting left non-dominant side [facility](HCC) - Ambulatory referral to Physical Therapy - Ambulatory referral to Neurology - Continue anticoagulation; need to gain better control of BP's - see orders below.  Taking statin and ASA.  5. Essential hypertension - amLODipine (NORVASC) 10 MG tablet; Take 1 tablet (10 mg total) by mouth daily.  Dispense: 90 tablet; Refill: 3 - hydrochlorothiazide (HYDRODIURIL) 12.5 MG tablet; Take 1 tablet (12.5 mg total) by mouth every morning.  Dispense: 90 tablet; Refill: 3 - BASIC METABOLIC PANEL WITH GFR; Future - 2 week follow up  6. Hyperlipidemia, unspecified hyperlipidemia type - Taking statin therapy.  7. Gastroesophageal reflux disease with esophagitis - Ambulatory referral to Gastroenterology  8. Type 2 diabetes mellitus with other circulatory complication, with long-term current use of insulin (HCC) - Needs A1C after 05/03/2019; will consider adding GLP-1 therapy.  Increase insulin to 24 units daily today  9. Nocturia - PSA

## 2019-04-15 DIAGNOSIS — E1165 Type 2 diabetes mellitus with hyperglycemia: Secondary | ICD-10-CM | POA: Diagnosis not present

## 2019-04-15 LAB — PSA: PSA: 1.9 ng/mL (ref <=4.0)

## 2019-04-16 DIAGNOSIS — E1165 Type 2 diabetes mellitus with hyperglycemia: Secondary | ICD-10-CM | POA: Diagnosis not present

## 2019-04-17 DIAGNOSIS — E1165 Type 2 diabetes mellitus with hyperglycemia: Secondary | ICD-10-CM | POA: Diagnosis not present

## 2019-04-18 ENCOUNTER — Telehealth: Payer: Self-pay

## 2019-04-18 ENCOUNTER — Telehealth: Payer: Self-pay | Admitting: Gastroenterology

## 2019-04-18 ENCOUNTER — Encounter: Payer: Self-pay | Admitting: Gastroenterology

## 2019-04-18 ENCOUNTER — Other Ambulatory Visit: Payer: Self-pay

## 2019-04-18 ENCOUNTER — Ambulatory Visit: Payer: Medicaid Other | Admitting: Gastroenterology

## 2019-04-18 ENCOUNTER — Encounter: Payer: Medicaid Other | Admitting: Physical Therapy

## 2019-04-18 ENCOUNTER — Other Ambulatory Visit: Payer: Self-pay | Admitting: Gastroenterology

## 2019-04-18 VITALS — BP 155/89 | HR 73 | Temp 98.1°F | Ht 65.0 in | Wt 154.8 lb

## 2019-04-18 DIAGNOSIS — K219 Gastro-esophageal reflux disease without esophagitis: Secondary | ICD-10-CM

## 2019-04-18 DIAGNOSIS — E1165 Type 2 diabetes mellitus with hyperglycemia: Secondary | ICD-10-CM | POA: Diagnosis not present

## 2019-04-18 MED ORDER — OMEPRAZOLE 40 MG PO CPDR: 40.0000 mg | DELAYED_RELEASE_CAPSULE | Freq: Every day | ORAL | 2 refills | Status: DC

## 2019-04-18 MED ORDER — PANTOPRAZOLE SODIUM 40 MG PO TBEC: 40.0000 mg | DELAYED_RELEASE_TABLET | Freq: Every day | ORAL | 2 refills | Status: DC

## 2019-04-18 NOTE — Telephone Encounter (Signed)
Bi received two prescriptions  pantoprazole (PROTONIX) 40 MG &   omeprazole (PRILOSEC) 40 MG   They need to clarify if patient needs to take  or both? Possible replacement? Please call & verify.

## 2019-04-18 NOTE — Patient Instructions (Signed)
Gastroesophageal Reflux Disease, Adult Gastroesophageal reflux (GER) happens when acid from the stomach flows up into the tube that connects the mouth and the stomach (esophagus). Normally, food travels down the esophagus and stays in the stomach to be digested. With GER, food and stomach acid sometimes move back up into the esophagus. You may have a disease called gastroesophageal reflux disease (GERD) if the reflux:  Happens often.  Causes frequent or very bad symptoms.  Causes problems such as damage to the esophagus. When this happens, the esophagus becomes sore and swollen (inflamed). Over time, GERD can make small holes (ulcers) in the lining of the esophagus. What are the causes? This condition is caused by a problem with the muscle between the esophagus and the stomach. When this muscle is weak or not normal, it does not close properly to keep food and acid from coming back up from the stomach. The muscle can be weak because of:  Tobacco use.  Pregnancy.  Having a certain type of hernia (hiatal hernia).  Alcohol use.  Certain foods and drinks, such as coffee, chocolate, onions, and peppermint. What increases the risk? You are more likely to develop this condition if you:  Are overweight.  Have a disease that affects your connective tissue.  Use NSAID medicines. What are the signs or symptoms? Symptoms of this condition include:  Heartburn.  Difficult or painful swallowing.  The feeling of having a lump in the throat.  A bitter taste in the mouth.  Bad breath.  Having a lot of saliva.  Having an upset or bloated stomach.  Belching.  Chest pain. Different conditions can cause chest pain. Make sure you see your doctor if you have chest pain.  Shortness of breath or noisy breathing (wheezing).  Ongoing (chronic) cough or a cough at night.  Wearing away of the surface of teeth (tooth enamel).  Weight loss. How is this treated? Treatment will depend on how  bad your symptoms are. Your doctor may suggest:  Changes to your diet.  Medicine.  Surgery. Follow these instructions at home: Eating and drinking   Follow a diet as told by your doctor. You may need to avoid foods and drinks such as: ? Coffee and tea (with or without caffeine). ? Drinks that contain alcohol. ? Energy drinks and sports drinks. ? Bubbly (carbonated) drinks or sodas. ? Chocolate and cocoa. ? Peppermint and mint flavorings. ? Garlic and onions. ? Horseradish. ? Spicy and acidic foods. These include peppers, chili powder, curry powder, vinegar, hot sauces, and BBQ sauce. ? Citrus fruit juices and citrus fruits, such as oranges, lemons, and limes. ? Tomato-based foods. These include red sauce, chili, salsa, and pizza with red sauce. ? Fried and fatty foods. These include donuts, french fries, potato chips, and high-fat dressings. ? High-fat meats. These include hot dogs, rib eye steak, sausage, ham, and bacon. ? High-fat dairy items, such as whole milk, butter, and cream cheese.  Eat small meals often. Avoid eating large meals.  Avoid drinking large amounts of liquid with your meals.  Avoid eating meals during the 2-3 hours before bedtime.  Avoid lying down right after you eat.  Do not exercise right after you eat. Lifestyle   Do not use any products that contain nicotine or tobacco. These include cigarettes, e-cigarettes, and chewing tobacco. If you need help quitting, ask your doctor.  Try to lower your stress. If you need help doing this, ask your doctor.  If you are overweight, lose an amount   of weight that is healthy for you. Ask your doctor about a safe weight loss goal. General instructions  Pay attention to any changes in your symptoms.  Take over-the-counter and prescription medicines only as told by your doctor. Do not take aspirin, ibuprofen, or other NSAIDs unless your doctor says it is okay.  Wear loose clothes. Do not wear anything tight  around your waist.  Raise (elevate) the head of your bed about 6 inches (15 cm).  Avoid bending over if this makes your symptoms worse.  Keep all follow-up visits as told by your doctor. This is important. Contact a doctor if:  You have new symptoms.  You lose weight and you do not know why.  You have trouble swallowing or it hurts to swallow.  You have wheezing or a cough that keeps happening.  Your symptoms do not get better with treatment.  You have a hoarse voice. Get help right away if:  You have pain in your arms, neck, jaw, teeth, or back.  You feel sweaty, dizzy, or light-headed.  You have chest pain or shortness of breath.  You throw up (vomit) and your throw-up looks like blood or coffee grounds.  You pass out (faint).  Your poop (stool) is bloody or black.  You cannot swallow, drink, or eat. Summary  If a person has gastroesophageal reflux disease (GERD), food and stomach acid move back up into the esophagus and cause symptoms or problems such as damage to the esophagus.  Treatment will depend on how bad your symptoms are.  Follow a diet as told by your doctor.  Take all medicines only as told by your doctor. This information is not intended to replace advice given to you by your health care provider. Make sure you discuss any questions you have with your health care provider. Document Released: 01/20/2008 Document Revised: 02/09/2018 Document Reviewed: 02/09/2018 Elsevier Patient Education  2020 [employer] Food Choices for Gastroesophageal Reflux Disease, Adult When you have gastroesophageal reflux disease (GERD), the foods you eat and your eating habits are very important. Choosing the right foods can help ease your discomfort. Think about working with a nutrition specialist (dietitian) to help you make good choices. What are tips for following this plan?  Meals  Choose healthy foods that are low in fat, such as fruits, vegetables, whole grains,  low-fat dairy products, and lean meat, fish, and poultry.  Eat small meals often instead of 3 large meals a day. Eat your meals slowly, and in a place where you are relaxed. Avoid bending over or lying down until 2-3 hours after eating.  Avoid eating meals 2-3 hours before bed.  Avoid drinking a lot of liquid with meals.  Cook foods using methods other than frying. Bake, grill, or broil food instead.  Avoid or limit: ? Chocolate. ? Peppermint or spearmint. ? Alcohol. ? Pepper. ? Black and decaffeinated coffee. ? Black and decaffeinated tea. ? Bubbly (carbonated) soft drinks. ? Caffeinated energy drinks and soft drinks.  Limit high-fat foods such as: ? Fatty meat or fried foods. ? Whole milk, cream, butter, or ice cream. ? Nuts and nut butters. ? Pastries, donuts, and sweets made with butter or shortening.  Avoid foods that cause symptoms. These foods may be different for everyone. Common foods that cause symptoms include: ? Tomatoes. ? Oranges, lemons, and limes. ? Peppers. ? Spicy food. ? Onions and garlic. ? Vinegar. Lifestyle  Maintain a healthy weight. Ask your doctor what weight is healthy for you.   If you need to lose weight, work with your doctor to do so safely.  Exercise for at least 30 minutes for 5 or more days each week, or as told by your doctor.  Wear loose-fitting clothes.  Do not smoke. If you need help quitting, ask your doctor.  Sleep with the head of your bed higher than your feet. Use a wedge under the mattress or blocks under the bed frame to raise the head of the bed. Summary  When you have gastroesophageal reflux disease (GERD), food and lifestyle choices are very important in easing your symptoms.  Eat small meals often instead of 3 large meals a day. Eat your meals slowly, and in a place where you are relaxed.  Limit high-fat foods such as fatty meat or fried foods.  Avoid bending over or lying down until 2-3 hours after eating.  Avoid  peppermint and spearmint, caffeine, alcohol, and chocolate. This information is not intended to replace advice given to you by your health care provider. Make sure you discuss any questions you have with your health care provider. Document Released: 02/02/2012 Document Revised: 11/24/2018 Document Reviewed: 09/08/2016 Elsevier Patient Education  2020 [employer] Inc.  

## 2019-04-18 NOTE — Progress Notes (Signed)
Evan Darby, MD [address]  [place]  [place], [place] [address]  Main: [phone]  Fax: [phone]    Gastroenterology Consultation  Referring Provider:     Hubbard Hartshorn, FNP Primary Care Physician:  Hubbard Hartshorn, FNP Primary Gastroenterologist:  Dr. Cephas Townsend Reason for Consultation:     GERD        HPI:   Evan GIESELMAN Sr. is a 63 y.o. male referred by Dr. Uvaldo Rising, Astrid Divine, FNP  for consultation & management of chronic GERD.  Patient has been experiencing regurgitation, burping, acid taste in the mouth for last 3 months both during day and night.  He tried Protonix 20 mg twice daily for 3 months with no relief.  Patient acknowledges drinking sodas as well as eating hamburger daily.  He denies weight loss, dysphagia, epigastric pain.  Labs revealed mild normocytic anemia.  Normal iron levels.  He does not smoke or drink alcohol.  He had history of stroke with left sided weakness, on aspirin and Plavix.  NSAIDs: None  Antiplts/Anticoagulants/Anti thrombotics: DAPT for history of stroke  GI Procedures: Reports having had a colonoscopy about 4 to 5 years ago, report not available  Past Medical History:  Diagnosis Date  . Acute CVA (cerebrovascular accident) ([place]) 11/10/2016  . Diabetes 1.5, managed as type 2 ([place])   . Diabetes mellitus without complication ([place])   . Hypercholesterolemia   . Hypertension   . Pain in both feet   . Stroke [facility])     History reviewed. No pertinent surgical history.  Current Outpatient Medications:  .  acetaminophen (TYLENOL) 325 MG tablet, Take 2 tablets (650 mg total) by mouth every 4 (four) hours as needed for mild pain (or temp > 37.5 C (99.5 F))., Disp: , Rfl:  .  amLODipine (NORVASC) 10 MG tablet, Take 1 tablet (10 mg total) by mouth daily., Disp: 90 tablet, Rfl: 3 .  aspirin 81 MG EC tablet, Take 1 tablet (81 mg total) by mouth daily., Disp: 30 tablet, Rfl:  .  atorvastatin (LIPITOR) 80 MG tablet, Take 1 tablet (80  mg total) by mouth every evening., Disp: 30 tablet, Rfl: 0 .  clopidogrel (PLAVIX) 75 MG tablet, Take 1 tablet (75 mg total) by mouth daily., Disp: 30 tablet, Rfl: 1 .  gabapentin (NEURONTIN) 300 MG capsule, Take 2 capsules (600 mg total) by mouth 2 (two) times daily., Disp: 60 capsule, Rfl: 1 .  glipiZIDE (GLUCOTROL) 5 MG tablet, Take 1 tablet (5 mg total) by mouth daily before breakfast., Disp: 30 tablet, Rfl: 1 .  hydrochlorothiazide (HYDRODIURIL) 12.5 MG tablet, Take 1 tablet (12.5 mg total) by mouth every morning., Disp: 90 tablet, Rfl: 3 .  Insulin Detemir (LEVEMIR) 100 UNIT/ML Pen, Inject 20 Units into the skin daily., Disp: 15 mL, Rfl: 11 .  lisinopril (PRINIVIL,ZESTRIL) 40 MG tablet, Take 1 tablet (40 mg total) by mouth daily., Disp: 30 tablet, Rfl: 1 .  tiZANidine (ZANAFLEX) 2 MG tablet, Take 1 tablet (2 mg total) by mouth at bedtime as needed for muscle spasms (neck pain)., Disp: 30 tablet, Rfl: 0 .  pantoprazole (PROTONIX) 40 MG tablet, Take 1 tablet (40 mg total) by mouth daily before breakfast., Disp: 30 tablet, Rfl: 2   Family History  Problem Relation Age of Onset  . Hypertension Mother   . Hypertension Father   . Diabetes Father      Social History   Tobacco Use  . Smoking status: Former Smoker  Types: Cigarettes    Start date: 561982  . Smokeless tobacco: Never Used  Substance Use Topics  . Alcohol use: Yes    Alcohol/week: 1.0 standard drinks    Types: 1 Cans of beer per week  . Drug use: No    Allergies as of 04/18/2019  . (No Known Allergies)    Review of Systems:    All systems reviewed and negative except where noted in HPI.   Physical Exam:  BP (!) 155/89   Pulse 73   Temp 98.1 F (36.7 C) (Oral)   Ht 5\' 5"  (1.651 m)   Wt 154 lb 12.8 oz (70.2 kg)   BMI 25.76 kg/m  No LMP for male patient.  General:   Alert,  Well-developed, well-nourished, pleasant and cooperative in NAD Head:  Normocephalic and atraumatic. Eyes:  Sclera clear, no icterus.    Conjunctiva pink. Ears:  Normal auditory acuity. Nose:  No deformity, discharge, or lesions. Mouth:  No deformity or lesions,oropharynx pink & moist. Neck:  Supple; no masses or thyromegaly. Lungs:  Respirations even and unlabored.  Clear throughout to auscultation.   No wheezes, crackles, or rhonchi. No acute distress. Heart:  Regular rate and rhythm; no murmurs, clicks, rubs, or gallops. Abdomen:  Normal bowel sounds. Soft, non-tender and non-distended without masses, hepatosplenomegaly or hernias noted.  No guarding or rebound tenderness.   Rectal: Not performed Msk:  Symmetrical without gross deformities. Good, equal movement & strength bilaterally. Pulses:  Normal pulses noted. Extremities:  No clubbing or edema.  No cyanosis. Neurologic:  Alert and oriented x3; left-sided weakness, slurred speech Skin:  Intact without significant lesions or rashes. No jaundice. Psych:  Alert and cooperative. Normal mood and affect.  Imaging Studies: No recent abdominal imaging  Assessment and Plan:   Evan AnoDuncan E Lie Sr. is a 63 y.o. male with history of diabetes, CVA on aspirin and Plavix seen in consultation for chronic symptoms of burping, regurgitation, heartburn consistent with GERD  Antireflux lifestyle, education material provided Advised him to avoid hamburger and sodas regularly Trial of omeprazole 40 mg daily before breakfast Recommend EGD for further evaluation   Follow up in 2 months   Arlyss Repressohini R Vanga, MD

## 2019-04-18 NOTE — Telephone Encounter (Signed)
Omeprazole, not Protonix  RV

## 2019-04-18 NOTE — Telephone Encounter (Signed)
Patients wife Rise Paganini and pharmacist at [facility] have been advised to start new rx Omeprazole, discontinue use of Protonix.  Thanks [employer]

## 2019-04-19 ENCOUNTER — Telehealth: Payer: Self-pay | Admitting: Gastroenterology

## 2019-04-19 ENCOUNTER — Encounter: Payer: Self-pay | Admitting: Family Medicine

## 2019-04-19 ENCOUNTER — Telehealth: Payer: Self-pay

## 2019-04-19 ENCOUNTER — Telehealth: Payer: Self-pay | Admitting: Family Medicine

## 2019-04-19 DIAGNOSIS — E1165 Type 2 diabetes mellitus with hyperglycemia: Secondary | ICD-10-CM | POA: Diagnosis not present

## 2019-04-19 NOTE — Telephone Encounter (Signed)
Received request to pause Plavix prior to EGD procedure.  Please call to confirm with patient that stopping his plavix for any period of time does increase the risk of recurrent stroke and/or occurrence of heart attack.  The guidelines recommend stopping the plavix 7 days prior to surgery and restarting when oral intake of food is resumed (may be same day or next day).  I have signed off on this paperwork for him, please ensure that he is notified.

## 2019-04-19 NOTE — Telephone Encounter (Signed)
Documentation reviewed 

## 2019-04-19 NOTE — Telephone Encounter (Signed)
Pt wife  needs instructions on when to stop his medicine when asked what Medicine pt wife hung up please call pt with instructions

## 2019-04-19 NOTE — Telephone Encounter (Signed)
Blood thinner advice given to patients wife [place].  Rise Paganini has been advised per Raelyn Ensign to have her husband stop his Plavix starting today (7 days prior to his 9/09 EGD) resume 1 day after or advised by GI.  Thanks [employer]

## 2019-04-19 NOTE — Telephone Encounter (Signed)
Spoke with pt wife and she has been notified to stop Plavix 7 days prior to procedure on 04/26/2019 and resume 1 day after procedure, pt wife verbalized understanding

## 2019-04-19 NOTE — Telephone Encounter (Signed)
Patient wife was notified. She stated that  The office for EGD appointment had already called them to stop medication on yesterday

## 2019-04-20 ENCOUNTER — Encounter: Payer: Medicaid Other | Admitting: Physical Therapy

## 2019-04-20 DIAGNOSIS — E113313 Type 2 diabetes mellitus with moderate nonproliferative diabetic retinopathy with macular edema, bilateral: Secondary | ICD-10-CM | POA: Diagnosis not present

## 2019-04-20 DIAGNOSIS — E1165 Type 2 diabetes mellitus with hyperglycemia: Secondary | ICD-10-CM | POA: Diagnosis not present

## 2019-04-21 ENCOUNTER — Other Ambulatory Visit
Admission: RE | Admit: 2019-04-21 | Discharge: 2019-04-21 | Disposition: A | Discharge status: Home / Self Care | Payer: Medicaid Other | Source: Ambulatory Visit | Attending: Gastroenterology | Admitting: Gastroenterology

## 2019-04-21 ENCOUNTER — Other Ambulatory Visit: Payer: Self-pay

## 2019-04-21 DIAGNOSIS — E1165 Type 2 diabetes mellitus with hyperglycemia: Secondary | ICD-10-CM | POA: Diagnosis not present

## 2019-04-21 DIAGNOSIS — Z01812 Encounter for preprocedural laboratory examination: Secondary | ICD-10-CM | POA: Insufficient documentation

## 2019-04-21 DIAGNOSIS — Z20828 Contact with and (suspected) exposure to other viral communicable diseases: Secondary | ICD-10-CM | POA: Insufficient documentation

## 2019-04-21 DIAGNOSIS — K219 Gastro-esophageal reflux disease without esophagitis: Secondary | ICD-10-CM | POA: Insufficient documentation

## 2019-04-21 LAB — SARS CORONAVIRUS 2 (TAT 6-24 HRS): SARS Coronavirus 2: NEGATIVE

## 2019-04-22 DIAGNOSIS — E1165 Type 2 diabetes mellitus with hyperglycemia: Secondary | ICD-10-CM | POA: Diagnosis not present

## 2019-04-23 DIAGNOSIS — E1165 Type 2 diabetes mellitus with hyperglycemia: Secondary | ICD-10-CM | POA: Diagnosis not present

## 2019-04-24 DIAGNOSIS — E1165 Type 2 diabetes mellitus with hyperglycemia: Secondary | ICD-10-CM | POA: Diagnosis not present

## 2019-04-25 ENCOUNTER — Telehealth: Payer: Self-pay | Admitting: Gastroenterology

## 2019-04-25 ENCOUNTER — Encounter: Payer: Medicaid Other | Admitting: Physical Therapy

## 2019-04-25 DIAGNOSIS — E1165 Type 2 diabetes mellitus with hyperglycemia: Secondary | ICD-10-CM | POA: Diagnosis not present

## 2019-04-25 NOTE — Telephone Encounter (Signed)
Pt left vm he needs instructions on a prep for a Procedure coming up please call

## 2019-04-26 ENCOUNTER — Encounter
Admission: RE | Disposition: A | Discharge status: Home / Self Care | Payer: Self-pay | Source: Home / Self Care | Attending: Gastroenterology

## 2019-04-26 ENCOUNTER — Ambulatory Visit: Payer: Medicaid Other | Admitting: Registered Nurse

## 2019-04-26 ENCOUNTER — Encounter: Payer: Self-pay | Admitting: Emergency Medicine

## 2019-04-26 ENCOUNTER — Ambulatory Visit
Admission: RE | Admit: 2019-04-26 | Discharge: 2019-04-26 | Disposition: A | Discharge status: Home / Self Care | Payer: Medicaid Other | Attending: Gastroenterology | Admitting: Gastroenterology

## 2019-04-26 DIAGNOSIS — E7800 Pure hypercholesterolemia, unspecified: Secondary | ICD-10-CM | POA: Diagnosis not present

## 2019-04-26 DIAGNOSIS — E1165 Type 2 diabetes mellitus with hyperglycemia: Secondary | ICD-10-CM | POA: Diagnosis not present

## 2019-04-26 DIAGNOSIS — E139 Other specified diabetes mellitus without complications: Secondary | ICD-10-CM | POA: Insufficient documentation

## 2019-04-26 DIAGNOSIS — Z794 Long term (current) use of insulin: Secondary | ICD-10-CM | POA: Diagnosis not present

## 2019-04-26 DIAGNOSIS — Z87891 Personal history of nicotine dependence: Secondary | ICD-10-CM | POA: Insufficient documentation

## 2019-04-26 DIAGNOSIS — R1110 Vomiting, unspecified: Secondary | ICD-10-CM | POA: Diagnosis not present

## 2019-04-26 DIAGNOSIS — Z7902 Long term (current) use of antithrombotics/antiplatelets: Secondary | ICD-10-CM | POA: Insufficient documentation

## 2019-04-26 DIAGNOSIS — K210 Gastro-esophageal reflux disease with esophagitis: Secondary | ICD-10-CM | POA: Insufficient documentation

## 2019-04-26 DIAGNOSIS — I10 Essential (primary) hypertension: Secondary | ICD-10-CM | POA: Insufficient documentation

## 2019-04-26 DIAGNOSIS — E109 Type 1 diabetes mellitus without complications: Secondary | ICD-10-CM | POA: Diagnosis not present

## 2019-04-26 DIAGNOSIS — K219 Gastro-esophageal reflux disease without esophagitis: Secondary | ICD-10-CM | POA: Diagnosis not present

## 2019-04-26 DIAGNOSIS — Z79899 Other long term (current) drug therapy: Secondary | ICD-10-CM | POA: Insufficient documentation

## 2019-04-26 DIAGNOSIS — Z8673 Personal history of transient ischemic attack (TIA), and cerebral infarction without residual deficits: Secondary | ICD-10-CM | POA: Diagnosis not present

## 2019-04-26 DIAGNOSIS — Z7982 Long term (current) use of aspirin: Secondary | ICD-10-CM | POA: Diagnosis not present

## 2019-04-26 HISTORY — PX: ESOPHAGOGASTRODUODENOSCOPY (EGD) WITH PROPOFOL: SHX5813

## 2019-04-26 LAB — GLUCOSE, CAPILLARY: Glucose-Capillary: 375 mg/dL — ABNORMAL HIGH (ref 70–99)

## 2019-04-26 SURGERY — ESOPHAGOGASTRODUODENOSCOPY (EGD) WITH PROPOFOL
Anesthesia: General

## 2019-04-26 MED ORDER — SODIUM CHLORIDE 0.9 % IV SOLN
INTRAVENOUS | Status: DC
  Administered 2019-04-26: 09:00:00 1000 mL via INTRAVENOUS

## 2019-04-26 MED ORDER — GLYCOPYRROLATE 0.2 MG/ML IJ SOLN
INTRAMUSCULAR | Status: AC
  Filled 2019-04-26: qty 1

## 2019-04-26 MED ORDER — PROPOFOL 500 MG/50ML IV EMUL
INTRAVENOUS | Status: AC
  Filled 2019-04-26: qty 150

## 2019-04-26 MED ORDER — LIDOCAINE HCL (PF) 2 % IJ SOLN
INTRAMUSCULAR | Status: AC
  Filled 2019-04-26: qty 10

## 2019-04-26 MED ORDER — PROPOFOL 10 MG/ML IV BOLUS
INTRAVENOUS | Status: AC
  Filled 2019-04-26: qty 20

## 2019-04-26 MED ORDER — LIDOCAINE HCL (CARDIAC) PF 100 MG/5ML IV SOSY
PREFILLED_SYRINGE | INTRAVENOUS | Status: DC | PRN
  Administered 2019-04-26: 60 mg via INTRAVENOUS

## 2019-04-26 MED ORDER — PROPOFOL 500 MG/50ML IV EMUL
INTRAVENOUS | Status: DC | PRN
  Administered 2019-04-26: 140 ug/kg/min via INTRAVENOUS

## 2019-04-26 MED ORDER — GLYCOPYRROLATE 0.2 MG/ML IJ SOLN
INTRAMUSCULAR | Status: DC | PRN
  Administered 2019-04-26: 0.2 mg via INTRAVENOUS

## 2019-04-26 MED ORDER — PROPOFOL 10 MG/ML IV BOLUS
INTRAVENOUS | Status: DC | PRN
  Administered 2019-04-26: 20 mg via INTRAVENOUS
  Administered 2019-04-26: 60 mg via INTRAVENOUS

## 2019-04-26 MED ORDER — PROPOFOL 500 MG/50ML IV EMUL
INTRAVENOUS | Status: AC
  Filled 2019-04-26: qty 50

## 2019-04-26 MED ORDER — SUCCINYLCHOLINE CHLORIDE 20 MG/ML IJ SOLN
INTRAMUSCULAR | Status: AC
  Filled 2019-04-26: qty 1

## 2019-04-26 NOTE — Transfer of Care (Signed)
Immediate Anesthesia Transfer of Care Note  Patient: Evan MAHAR Sr.  Procedure(s) Performed: ESOPHAGOGASTRODUODENOSCOPY (EGD) WITH PROPOFOL (N/A )  Patient Location: PACU  Anesthesia Type:General  Level of Consciousness: sedated  Airway & Oxygen Therapy: Patient Spontanous Breathing  Post-op Assessment: Report given to RN and Post -op Vital signs reviewed and stable  Post vital signs: Reviewed and stable  Last Vitals:  Vitals Value Taken Time  BP 97/76 04/26/19 0913  Temp 36.6 C 04/26/19 0913  Pulse 58 04/26/19 0913  Resp 14 04/26/19 0913  SpO2 100 % 04/26/19 0913  Vitals shown include unvalidated device data.  Last Pain:  Vitals:   04/26/19 0802  TempSrc: Tympanic  PainSc: 0-No pain         Complications: No apparent anesthesia complications

## 2019-04-26 NOTE — Anesthesia Post-op Follow-up Note (Signed)
Anesthesia QCDR form completed.        

## 2019-04-26 NOTE — Op Note (Signed)
[facility] Gastroenterology Patient Name: Evan Townsend Procedure Date: 04/26/2019 7:26 AM MRN: [id number] Account #: [id number] Date of Birth: 09/11/1955 Admit Type: Outpatient Age: 63 Room: [facility] ENDO ROOM 1 Gender: Male Note Status: Finalized Procedure:            Upper GI endoscopy Indications:          Esophageal reflux symptoms that recur despite                        appropriate therapy, Regurgitation Providers:            Lin Landsman MD, MD Medicines:            Monitored Anesthesia Care Complications:        No immediate complications. Estimated blood loss: None. Procedure:            Pre-Anesthesia Assessment:                       - Prior to the procedure, a History and Physical was                        performed, and patient medications and allergies were                        reviewed. The patient is competent. The risks and                        benefits of the procedure and the sedation options and                        risks were discussed with the patient. All questions                        were answered and informed consent was obtained.                        Patient identification and proposed procedure were                        verified by the physician, the nurse, the                        anesthesiologist, the anesthetist and the technician in                        the pre-procedure area in the procedure room in the                        endoscopy suite. Mental Status Examination: alert and                        oriented. Airway Examination: normal oropharyngeal                        airway and neck mobility. Respiratory Examination:                        clear to auscultation. CV Examination: normal.  Prophylactic Antibiotics: The patient does not require                        prophylactic antibiotics. Prior Anticoagulants: The                        patient has taken Plavix (clopidogrel), last  dose was 1                        day prior to procedure. ASA Grade Assessment: IV - A                        patient with severe systemic disease that is a constant                        threat to life. After reviewing the risks and benefits,                        the patient was deemed in satisfactory condition to                        undergo the procedure. The anesthesia plan was to use                        monitored anesthesia care (MAC). Immediately prior to                        administration of medications, the patient was                        re-assessed for adequacy to receive sedatives. The                        heart rate, respiratory rate, oxygen saturations, blood                        pressure, adequacy of pulmonary ventilation, and                        response to care were monitored throughout the                        procedure. The physical status of the patient was                        re-assessed after the procedure.                       After obtaining informed consent, the endoscope was                        passed under direct vision. Throughout the procedure,                        the patient's blood pressure, pulse, and oxygen                        saturations were monitored continuously. The Endoscope  was introduced through the mouth, and advanced to the                        second part of duodenum. The upper GI endoscopy was                        accomplished without difficulty. The patient tolerated                        the procedure well. Findings:      The duodenal bulb and second portion of the duodenum were normal.      A medium amount of food (residue) was found in the gastric body.      The entire examined stomach was normal.      The cardia and gastric fundus were normal on retroflexion.      Esophagogastric landmarks were identified: the gastroesophageal junction       was found at 35 cm from the  incisors.      The gastroesophageal junction and examined esophagus were normal. Impression:           - Normal duodenal bulb and second portion of the                        duodenum.                       - A medium amount of food (residue) in the stomach.                       - Normal stomach.                       - Esophagogastric landmarks identified.                       - Normal gastroesophageal junction and esophagus.                       - No specimens collected. Recommendation:       - Discharge patient to home (with escort).                       - Chopped diet and cardiac diet.                       - Continue present medications.                       - Resume Plavix (clopidogrel) today at prior dose. Procedure Code(s):    --- Professional ---                       [phone], Esophagogastroduodenoscopy, flexible, transoral;                        diagnostic, including collection of specimen(s) by                        brushing or washing, when performed (separate procedure) Diagnosis Code(s):    --- Professional ---                       K21.9, Gastro-esophageal reflux  disease without                        esophagitis                       R11.10, Vomiting, unspecified CPT copyright 2019 American Medical Association. All rights reserved. The codes documented in this report are preliminary and upon coder review may  be revised to meet current compliance requirements. Dr. Ulyess Mort Lin Landsman MD, MD 04/26/2019 9:12:46 AM This report has been signed electronically. Number of Addenda: 0 Note Initiated On: 04/26/2019 7:26 AM Estimated Blood Loss: Estimated blood loss: none.      [facility]

## 2019-04-26 NOTE — Anesthesia Preprocedure Evaluation (Signed)
Anesthesia Evaluation  Patient identified by MRN, date of birth, ID band Patient awake    Reviewed: Allergy & Precautions, H&P , NPO status , Patient's Chart, lab work & pertinent test results, reviewed documented beta blocker date and time   Airway Mallampati: II   Neck ROM: full    Dental  (+) Poor Dentition   Pulmonary neg pulmonary ROS, former smoker,    Pulmonary exam normal        Cardiovascular Exercise Tolerance: Poor hypertension, On Medications negative cardio ROS Normal cardiovascular exam+ dysrhythmias  Rhythm:regular Rate:Normal     Neuro/Psych TIACVA, Residual Symptoms negative neurological ROS  negative psych ROS   GI/Hepatic negative GI ROS, Neg liver ROS,   Endo/Other  negative endocrine ROSdiabetes, Well Controlled, Type 1, Insulin Dependent  Renal/GU negative Renal ROS  negative genitourinary   Musculoskeletal   Abdominal   Peds  Hematology negative hematology ROS (+)   Anesthesia Other Findings Past Medical History: 11/10/2016: Acute CVA (cerebrovascular accident) ([place]) No date: Diabetes 1.5, managed as type 2 ([place]) No date: Diabetes mellitus without complication ([place]) No date: Hypercholesterolemia No date: Hypertension No date: Pain in both feet No date: Stroke [facility]) History reviewed. No pertinent surgical history. BMI    Body Mass Index: 24.21 kg/m     Reproductive/Obstetrics negative OB ROS                             Anesthesia Physical Anesthesia Plan  ASA: IV  Anesthesia Plan: General   Post-op Pain Management:    Induction:   PONV Risk Score and Plan:   Airway Management Planned:   Additional Equipment:   Intra-op Plan:   Post-operative Plan:   Informed Consent: I have reviewed the patients History and Physical, chart, labs and discussed the procedure including the risks, benefits and alternatives for the proposed anesthesia with the  patient or authorized representative who has indicated his/her understanding and acceptance.     Dental Advisory Given  Plan Discussed with: CRNA  Anesthesia Plan Comments:         Anesthesia Quick Evaluation

## 2019-04-26 NOTE — H&P (Signed)
Cephas Darby, MD [address]  [place]  [place], [place] [address]  Main: [phone]  Fax: [phone] Pager: [phone]  Primary Care Physician:  Hubbard Hartshorn, FNP Primary Gastroenterologist:  Dr. Cephas Darby  Pre-Procedure History & Physical: HPI:  Evan PAGLIARULO Sr. is a 63 y.o. male is here for an endoscopy.   Past Medical History:  Diagnosis Date  . Acute CVA (cerebrovascular accident) ([place]) 11/10/2016  . Diabetes 1.5, managed as type 2 ([place])   . Diabetes mellitus without complication ([place])   . Hypercholesterolemia   . Hypertension   . Pain in both feet   . Stroke [facility])     History reviewed. No pertinent surgical history.  Prior to Admission medications   Medication Sig Start Date End Date Taking? Authorizing Provider  acetaminophen (TYLENOL) 325 MG tablet Take 2 tablets (650 mg total) by mouth every 4 (four) hours as needed for mild pain (or temp > 37.5 C (99.5 F)). 09/23/18  Yes Angiulli, Lavon Paganini, PA-C  amLODipine (NORVASC) 10 MG tablet Take 1 tablet (10 mg total) by mouth daily. 04/14/19 04/13/20 Yes Hubbard Hartshorn, FNP  aspirin 81 MG EC tablet Take 1 tablet (81 mg total) by mouth daily. 02/03/17  Yes Tawni Millers, MD  atorvastatin (LIPITOR) 80 MG tablet Take 1 tablet (80 mg total) by mouth every evening. 09/23/18  Yes Angiulli, Lavon Paganini, PA-C  clopidogrel (PLAVIX) 75 MG tablet Take 1 tablet (75 mg total) by mouth daily. 09/23/18  Yes Angiulli, Lavon Paganini, PA-C  gabapentin (NEURONTIN) 300 MG capsule Take 2 capsules (600 mg total) by mouth 2 (two) times daily. 09/23/18  Yes Angiulli, Lavon Paganini, PA-C  glipiZIDE (GLUCOTROL) 5 MG tablet Take 1 tablet (5 mg total) by mouth daily before breakfast. 03/29/19  Yes Hubbard Hartshorn, FNP  hydrochlorothiazide (HYDRODIURIL) 12.5 MG tablet Take 1 tablet (12.5 mg total) by mouth every morning. 04/14/19  Yes Hubbard Hartshorn, FNP  Insulin Detemir (LEVEMIR) 100 UNIT/ML Pen Inject 20 Units into the skin daily. 01/31/19  Yes Hubbard Hartshorn, FNP  lisinopril (PRINIVIL,ZESTRIL) 40 MG tablet Take 1 tablet (40 mg total) by mouth daily. 09/23/18  Yes Angiulli, Lavon Paganini, PA-C  pantoprazole (PROTONIX) 40 MG tablet Take 1 tablet (40 mg total) by mouth daily before breakfast. 04/18/19  Yes Zebulun Deman, Tally Due, MD  tiZANidine (ZANAFLEX) 2 MG tablet Take 1 tablet (2 mg total) by mouth at bedtime as needed for muscle spasms (neck pain). 04/14/19  Yes Hubbard Hartshorn, FNP    Allergies as of 04/19/2019  . (No Known Allergies)    Family History  Problem Relation Age of Onset  . Hypertension Mother   . Hypertension Father   . Diabetes Father     Social History   Socioeconomic History  . Marital status: Married    Spouse name: Presenter, broadcasting  . Number of children: 5  . Years of education: Not on file  . Highest education level: Not on file  Occupational History  . Occupation: unemployed  Social Needs  . Financial resource strain: Very hard  . Food insecurity    Worry: Never true    Inability: Never true  . Transportation needs    Medical: No    Non-medical: No  Tobacco Use  . Smoking status: Former Smoker    Types: Cigarettes    Start date: 1982  . Smokeless tobacco: Never Used  Substance and Sexual Activity  . Alcohol use: Yes  Alcohol/week: 1.0 standard drinks    Types: 1 Cans of beer per week  . Drug use: No  . Sexual activity: Yes  Lifestyle  . Physical activity    Days per week: 0 days    Minutes per session: Not on file  . Stress: Only a little  Relationships  . Social connections    Talks on phone: More than three times a week    Gets together: Once a week    Attends religious service: More than 4 times per year    Active member of club or organization: No    Attends meetings of clubs or organizations: Never    Relationship status: Married  . Intimate partner violence    Fear of current or ex partner: No    Emotionally abused: No    Physically abused: No    Forced sexual activity: No  Other Topics  Concern  . Not on file  Social History Narrative  . Not on file    Review of Systems: See HPI, otherwise negative ROS  Physical Exam: BP 97/76 (BP Location: Left Arm)   Pulse 60   Temp 97.8 F (36.6 C)   Resp 13   Ht 5\' 6"  (1.676 m)   Wt 68 kg   SpO2 100%   BMI 24.21 kg/m  General:   Alert,  pleasant and cooperative in NAD Head:  Normocephalic and atraumatic. Neck:  Supple; no masses or thyromegaly. Lungs:  Clear throughout to auscultation.    Heart:  Regular rate and rhythm. Abdomen:  Soft, nontender and nondistended. Normal bowel sounds, without guarding, and without rebound.   Neurologic:  Alert and  oriented x4;  grossly normal neurologically.  Impression/Plan: Evan Anouncan E Impson Sr. is here for an endoscopy to be performed for GERD  Risks, benefits, limitations, and alternatives regarding  endoscopy have been reviewed with the patient.  Questions have been answered.  All parties agreeable.   Lannette Donathohini Henna Derderian, MD  04/26/2019, 9:22 AM

## 2019-04-27 ENCOUNTER — Encounter: Payer: Self-pay | Admitting: Gastroenterology

## 2019-04-27 ENCOUNTER — Encounter: Payer: Medicaid Other | Admitting: Physical Therapy

## 2019-04-27 DIAGNOSIS — E1165 Type 2 diabetes mellitus with hyperglycemia: Secondary | ICD-10-CM | POA: Diagnosis not present

## 2019-04-28 DIAGNOSIS — E1165 Type 2 diabetes mellitus with hyperglycemia: Secondary | ICD-10-CM | POA: Diagnosis not present

## 2019-04-28 NOTE — Anesthesia Postprocedure Evaluation (Signed)
Anesthesia Post Note  Patient: Evan TROUTEN Sr.  Procedure(s) Performed: ESOPHAGOGASTRODUODENOSCOPY (EGD) WITH PROPOFOL (N/A )  Patient location during evaluation: PACU Anesthesia Type: General Level of consciousness: awake and alert Pain management: pain level controlled Vital Signs Assessment: post-procedure vital signs reviewed and stable Respiratory status: spontaneous breathing, nonlabored ventilation, respiratory function stable and patient connected to nasal cannula oxygen Cardiovascular status: blood pressure returned to baseline and stable Postop Assessment: no apparent nausea or vomiting Anesthetic complications: no     Last Vitals:  Vitals:   04/26/19 0932 04/26/19 0942  BP: 111/79 (!) 127/93  Pulse: (!) 56 (!) 58  Resp: 12 14  Temp:    SpO2: 99% 99%    Last Pain:  Vitals:   04/26/19 0942  TempSrc:   PainSc: 0-No pain                 Molli Barrows

## 2019-04-29 DIAGNOSIS — E1165 Type 2 diabetes mellitus with hyperglycemia: Secondary | ICD-10-CM | POA: Diagnosis not present

## 2019-04-30 DIAGNOSIS — E1165 Type 2 diabetes mellitus with hyperglycemia: Secondary | ICD-10-CM | POA: Diagnosis not present

## 2019-05-01 DIAGNOSIS — E1165 Type 2 diabetes mellitus with hyperglycemia: Secondary | ICD-10-CM | POA: Diagnosis not present

## 2019-05-02 ENCOUNTER — Encounter: Payer: Medicaid Other | Admitting: Physical Therapy

## 2019-05-02 DIAGNOSIS — E1165 Type 2 diabetes mellitus with hyperglycemia: Secondary | ICD-10-CM | POA: Diagnosis not present

## 2019-05-03 DIAGNOSIS — E1165 Type 2 diabetes mellitus with hyperglycemia: Secondary | ICD-10-CM | POA: Diagnosis not present

## 2019-05-04 ENCOUNTER — Ambulatory Visit: Payer: Medicaid Other

## 2019-05-04 ENCOUNTER — Other Ambulatory Visit: Payer: Self-pay

## 2019-05-04 ENCOUNTER — Encounter: Payer: Medicaid Other | Admitting: Physical Therapy

## 2019-05-04 VITALS — BP 142/78

## 2019-05-04 DIAGNOSIS — I10 Essential (primary) hypertension: Secondary | ICD-10-CM | POA: Diagnosis not present

## 2019-05-04 DIAGNOSIS — E1165 Type 2 diabetes mellitus with hyperglycemia: Secondary | ICD-10-CM | POA: Diagnosis not present

## 2019-05-04 DIAGNOSIS — Z794 Long term (current) use of insulin: Secondary | ICD-10-CM | POA: Diagnosis not present

## 2019-05-04 DIAGNOSIS — E1159 Type 2 diabetes mellitus with other circulatory complications: Secondary | ICD-10-CM

## 2019-05-04 DIAGNOSIS — Z23 Encounter for immunization: Secondary | ICD-10-CM | POA: Diagnosis not present

## 2019-05-04 LAB — POCT GLYCOSYLATED HEMOGLOBIN (HGB A1C): Hemoglobin A1C: 13.8 % — AB (ref 4.0–5.6)

## 2019-05-05 DIAGNOSIS — E1165 Type 2 diabetes mellitus with hyperglycemia: Secondary | ICD-10-CM | POA: Diagnosis not present

## 2019-05-05 LAB — BASIC METABOLIC PANEL WITH GFR
BUN: 22 mg/dL (ref 7–25)
CO2: 28 mmol/L (ref 20–32)
Calcium: 10.5 mg/dL — ABNORMAL HIGH (ref 8.6–10.3)
Chloride: 98 mmol/L (ref 98–110)
Creat: 1.19 mg/dL (ref 0.70–1.25)
GFR, Est African American: 75 mL/min/{1.73_m2} (ref >=60)
GFR, Est Non African American: 65 mL/min/{1.73_m2} (ref >=60)
Glucose, Bld: 428 mg/dL — ABNORMAL HIGH (ref 65–99)
Potassium: 4.4 mmol/L (ref 3.5–5.3)
Sodium: 136 mmol/L (ref 135–146)

## 2019-05-06 DIAGNOSIS — E1165 Type 2 diabetes mellitus with hyperglycemia: Secondary | ICD-10-CM | POA: Diagnosis not present

## 2019-05-07 DIAGNOSIS — E1165 Type 2 diabetes mellitus with hyperglycemia: Secondary | ICD-10-CM | POA: Diagnosis not present

## 2019-05-08 DIAGNOSIS — E1165 Type 2 diabetes mellitus with hyperglycemia: Secondary | ICD-10-CM | POA: Diagnosis not present

## 2019-05-09 ENCOUNTER — Encounter: Payer: Medicaid Other | Admitting: Physical Therapy

## 2019-05-09 DIAGNOSIS — E1165 Type 2 diabetes mellitus with hyperglycemia: Secondary | ICD-10-CM | POA: Diagnosis not present

## 2019-05-10 ENCOUNTER — Telehealth: Payer: Self-pay | Admitting: Family Medicine

## 2019-05-10 DIAGNOSIS — E1165 Type 2 diabetes mellitus with hyperglycemia: Secondary | ICD-10-CM | POA: Diagnosis not present

## 2019-05-10 NOTE — Telephone Encounter (Signed)
-----   Message from Chilton Greathouse, [place] sent at 05/10/2019  9:22 AM EDT -----  ----- Message ----- From: Steele Sizer, MD Sent: 05/09/2019   5:43 PM EDT To: Chilton Greathouse, CMA  This patient needs to be seen this week if possible, glucose is very high, normal kidney and liver function tests Calcium also elevated and we will need to see if they can add parathyroid hormone

## 2019-05-10 NOTE — Telephone Encounter (Signed)
lvm for pt to call and schedule an appt this week

## 2019-05-11 DIAGNOSIS — E1165 Type 2 diabetes mellitus with hyperglycemia: Secondary | ICD-10-CM | POA: Diagnosis not present

## 2019-05-12 DIAGNOSIS — E1165 Type 2 diabetes mellitus with hyperglycemia: Secondary | ICD-10-CM | POA: Diagnosis not present

## 2019-05-13 DIAGNOSIS — E1165 Type 2 diabetes mellitus with hyperglycemia: Secondary | ICD-10-CM | POA: Diagnosis not present

## 2019-05-17 DIAGNOSIS — E1165 Type 2 diabetes mellitus with hyperglycemia: Secondary | ICD-10-CM | POA: Diagnosis not present

## 2019-05-18 DIAGNOSIS — E1165 Type 2 diabetes mellitus with hyperglycemia: Secondary | ICD-10-CM | POA: Diagnosis not present

## 2019-05-21 DIAGNOSIS — E1165 Type 2 diabetes mellitus with hyperglycemia: Secondary | ICD-10-CM | POA: Diagnosis not present

## 2019-05-22 DIAGNOSIS — E1165 Type 2 diabetes mellitus with hyperglycemia: Secondary | ICD-10-CM | POA: Diagnosis not present

## 2019-05-23 DIAGNOSIS — E1165 Type 2 diabetes mellitus with hyperglycemia: Secondary | ICD-10-CM | POA: Diagnosis not present

## 2019-05-24 DIAGNOSIS — E1165 Type 2 diabetes mellitus with hyperglycemia: Secondary | ICD-10-CM | POA: Diagnosis not present

## 2019-05-25 ENCOUNTER — Telehealth: Payer: Self-pay

## 2019-05-25 ENCOUNTER — Institutional Professional Consult (permissible substitution): Payer: Medicaid Other | Admitting: Neurology

## 2019-05-25 DIAGNOSIS — E1165 Type 2 diabetes mellitus with hyperglycemia: Secondary | ICD-10-CM | POA: Diagnosis not present

## 2019-05-25 NOTE — Telephone Encounter (Signed)
Patient no show for appt today. 

## 2019-05-26 ENCOUNTER — Other Ambulatory Visit: Payer: Self-pay

## 2019-05-26 DIAGNOSIS — Z20822 Contact with and (suspected) exposure to covid-19: Secondary | ICD-10-CM

## 2019-05-26 DIAGNOSIS — E1165 Type 2 diabetes mellitus with hyperglycemia: Secondary | ICD-10-CM | POA: Diagnosis not present

## 2019-05-26 DIAGNOSIS — Z20828 Contact with and (suspected) exposure to other viral communicable diseases: Secondary | ICD-10-CM | POA: Diagnosis not present

## 2019-05-27 DIAGNOSIS — E1165 Type 2 diabetes mellitus with hyperglycemia: Secondary | ICD-10-CM | POA: Diagnosis not present

## 2019-05-27 LAB — NOVEL CORONAVIRUS, NAA: SARS-CoV-2, NAA: NOT DETECTED

## 2019-05-29 ENCOUNTER — Other Ambulatory Visit: Payer: Self-pay | Admitting: Family Medicine

## 2019-05-31 DIAGNOSIS — E1165 Type 2 diabetes mellitus with hyperglycemia: Secondary | ICD-10-CM | POA: Diagnosis not present

## 2019-06-01 DIAGNOSIS — E1165 Type 2 diabetes mellitus with hyperglycemia: Secondary | ICD-10-CM | POA: Diagnosis not present

## 2019-06-02 DIAGNOSIS — E1165 Type 2 diabetes mellitus with hyperglycemia: Secondary | ICD-10-CM | POA: Diagnosis not present

## 2019-06-03 DIAGNOSIS — E1165 Type 2 diabetes mellitus with hyperglycemia: Secondary | ICD-10-CM | POA: Diagnosis not present

## 2019-06-04 DIAGNOSIS — E1165 Type 2 diabetes mellitus with hyperglycemia: Secondary | ICD-10-CM | POA: Diagnosis not present

## 2019-06-05 DIAGNOSIS — E1165 Type 2 diabetes mellitus with hyperglycemia: Secondary | ICD-10-CM | POA: Diagnosis not present

## 2019-06-06 ENCOUNTER — Other Ambulatory Visit: Payer: Self-pay | Admitting: Family Medicine

## 2019-06-06 DIAGNOSIS — Z794 Long term (current) use of insulin: Secondary | ICD-10-CM

## 2019-06-06 DIAGNOSIS — E1165 Type 2 diabetes mellitus with hyperglycemia: Secondary | ICD-10-CM | POA: Diagnosis not present

## 2019-06-06 DIAGNOSIS — E113313 Type 2 diabetes mellitus with moderate nonproliferative diabetic retinopathy with macular edema, bilateral: Secondary | ICD-10-CM | POA: Diagnosis not present

## 2019-06-06 DIAGNOSIS — E1159 Type 2 diabetes mellitus with other circulatory complications: Secondary | ICD-10-CM

## 2019-06-06 NOTE — Telephone Encounter (Signed)
Please schedule patient for follow up in the next 30 days.  

## 2019-06-07 DIAGNOSIS — E1165 Type 2 diabetes mellitus with hyperglycemia: Secondary | ICD-10-CM | POA: Diagnosis not present

## 2019-06-08 ENCOUNTER — Other Ambulatory Visit: Payer: Self-pay | Admitting: Family Medicine

## 2019-06-08 DIAGNOSIS — E1159 Type 2 diabetes mellitus with other circulatory complications: Secondary | ICD-10-CM

## 2019-06-08 DIAGNOSIS — E1165 Type 2 diabetes mellitus with hyperglycemia: Secondary | ICD-10-CM | POA: Diagnosis not present

## 2019-06-09 DIAGNOSIS — E1165 Type 2 diabetes mellitus with hyperglycemia: Secondary | ICD-10-CM | POA: Diagnosis not present

## 2019-06-10 DIAGNOSIS — E1165 Type 2 diabetes mellitus with hyperglycemia: Secondary | ICD-10-CM | POA: Diagnosis not present

## 2019-06-11 DIAGNOSIS — E1165 Type 2 diabetes mellitus with hyperglycemia: Secondary | ICD-10-CM | POA: Diagnosis not present

## 2019-06-12 ENCOUNTER — Other Ambulatory Visit: Payer: Self-pay

## 2019-06-12 ENCOUNTER — Encounter: Payer: Self-pay | Admitting: Podiatry

## 2019-06-12 ENCOUNTER — Ambulatory Visit (INDEPENDENT_AMBULATORY_CARE_PROVIDER_SITE_OTHER): Payer: Medicaid Other | Admitting: Podiatry

## 2019-06-12 DIAGNOSIS — D689 Coagulation defect, unspecified: Secondary | ICD-10-CM

## 2019-06-12 DIAGNOSIS — M79674 Pain in right toe(s): Secondary | ICD-10-CM | POA: Diagnosis not present

## 2019-06-12 DIAGNOSIS — B351 Tinea unguium: Secondary | ICD-10-CM | POA: Diagnosis not present

## 2019-06-12 DIAGNOSIS — M79675 Pain in left toe(s): Secondary | ICD-10-CM | POA: Diagnosis not present

## 2019-06-12 DIAGNOSIS — E1142 Type 2 diabetes mellitus with diabetic polyneuropathy: Secondary | ICD-10-CM

## 2019-06-12 DIAGNOSIS — E1165 Type 2 diabetes mellitus with hyperglycemia: Secondary | ICD-10-CM | POA: Diagnosis not present

## 2019-06-12 NOTE — Progress Notes (Signed)
Complaint:  Visit Type: Patient returns to my office for continued preventative foot care services. Complaint: Patient states" my nails have grown long and thick and become painful to walk and wear shoes" Patient has been diagnosed with DM with no foot complications.  Patient has history of CVA. The patient presents for preventative foot care services.  Podiatric Exam: Vascular: dorsalis pedis and posterior tibial pulses are palpable bilateral. Capillary return is immediate. Temperature gradient is WNL. Skin turgor WNL  Sensorium: Normal Semmes Weinstein monofilament test. Normal tactile sensation bilaterally. Nail Exam: Pt has thick disfigured discolored nails with subungual debris noted bilateral entire nail hallux through fifth toenails Ulcer Exam: There is no evidence of ulcer or pre-ulcerative changes or infection. Orthopedic Exam: Muscle tone and strength are WNL. No limitations in general ROM. No crepitus or effusions noted. Foot type and digits show no abnormalities. Bony prominences are unremarkable. Skin: No Porokeratosis. No infection or ulcers  Diagnosis:  Onychomycosis, , Pain in right toe, pain in left toes  Treatment & Plan Procedures and Treatment: Consent by patient was obtained for treatment procedures.   Debridement of mycotic and hypertrophic toenails, 1 through 5 bilateral and clearing of subungual debris. No ulceration, no infection noted.  Return Visit-Office Procedure: Patient instructed to return to the office for a follow up visit 10 weeks  for continued evaluation and treatment.    Gardiner Barefoot DPM

## 2019-06-13 DIAGNOSIS — E1165 Type 2 diabetes mellitus with hyperglycemia: Secondary | ICD-10-CM | POA: Diagnosis not present

## 2019-06-14 DIAGNOSIS — E1165 Type 2 diabetes mellitus with hyperglycemia: Secondary | ICD-10-CM | POA: Diagnosis not present

## 2019-06-15 ENCOUNTER — Ambulatory Visit: Payer: Medicaid Other | Admitting: Podiatry

## 2019-06-15 DIAGNOSIS — E1165 Type 2 diabetes mellitus with hyperglycemia: Secondary | ICD-10-CM | POA: Diagnosis not present

## 2019-06-16 DIAGNOSIS — E1165 Type 2 diabetes mellitus with hyperglycemia: Secondary | ICD-10-CM | POA: Diagnosis not present

## 2019-06-17 DIAGNOSIS — E1165 Type 2 diabetes mellitus with hyperglycemia: Secondary | ICD-10-CM | POA: Diagnosis not present

## 2019-06-18 DIAGNOSIS — E1165 Type 2 diabetes mellitus with hyperglycemia: Secondary | ICD-10-CM | POA: Diagnosis not present

## 2019-06-19 DIAGNOSIS — E1165 Type 2 diabetes mellitus with hyperglycemia: Secondary | ICD-10-CM | POA: Diagnosis not present

## 2019-06-20 ENCOUNTER — Other Ambulatory Visit: Payer: Self-pay

## 2019-06-20 ENCOUNTER — Ambulatory Visit: Payer: Medicaid Other | Admitting: Gastroenterology

## 2019-06-20 ENCOUNTER — Encounter: Payer: Self-pay | Admitting: Gastroenterology

## 2019-06-20 VITALS — BP 165/96 | HR 86 | Temp 97.4°F | Resp 17 | Ht 60.0 in | Wt 162.6 lb

## 2019-06-20 DIAGNOSIS — K3184 Gastroparesis: Secondary | ICD-10-CM

## 2019-06-20 DIAGNOSIS — E1165 Type 2 diabetes mellitus with hyperglycemia: Secondary | ICD-10-CM | POA: Diagnosis not present

## 2019-06-20 DIAGNOSIS — R1013 Epigastric pain: Secondary | ICD-10-CM

## 2019-06-20 MED ORDER — METOCLOPRAMIDE HCL 5 MG PO TABS: 5.0000 mg | ORAL_TABLET | Freq: Three times a day (TID) | ORAL | 0 refills | Status: DC

## 2019-06-20 NOTE — Progress Notes (Signed)
Cephas Darby, MD [address]  [place]  [place], [place] [address]  Main: [phone]  Fax: [phone]    Gastroenterology Consultation  Referring Provider:     Hubbard Hartshorn, FNP Primary Care Physician:  Hubbard Hartshorn, FNP Primary Gastroenterologist:  Dr. Cephas Darby Reason for Consultation: Dyspepsia        HPI:   KAPONO LUHN Sr. is a 63 y.o. male referred by Dr. Uvaldo Rising, Astrid Divine, FNP  for consultation & management of chronic GERD.  Patient has been experiencing regurgitation, burping, acid taste in the mouth for last 3 months both during day and night.  He tried Protonix 20 mg twice daily for 3 months with no relief.  Patient acknowledges drinking sodas as well as eating hamburger daily.  He denies weight loss, dysphagia, epigastric pain.  Labs revealed mild normocytic anemia.  Normal iron levels.  He does not smoke or drink alcohol.  He had history of stroke with left sided weakness, on aspirin and Plavix.  Follow-up visit 06/20/2019 Patient underwent EGD which revealed moderate amount of solid food in his stomach.  Otherwise unremarkable.  He continues to have fullness in his stomach, regurgitation of food, nausea and vomiting.  He said he cut back on sodas but continues to drink.  His hemoglobin A1c was 13.8.  His sugars are not under control  NSAIDs: None  Antiplts/Anticoagulants/Anti thrombotics: DAPT for history of stroke  GI Procedures: Reports having had a colonoscopy about 4 to 5 years ago, report not available EGD 04/26/19 - Normal duodenal bulb and second portion of the duodenum. - A medium amount of food (residue) in the stomach. - Normal stomach. - Esophagogastric landmarks identified. - Normal gastroesophageal junction and esophagus. - No specimens collected.  Past Medical History:  Diagnosis Date  . Acute CVA (cerebrovascular accident) ([place]) 11/10/2016  . Diabetes 1.5, managed as type 2 ([place])   . Diabetes mellitus without complication ([place])    . Hypercholesterolemia   . Hypertension   . Pain in both feet   . Stroke [facility])     Past Surgical History:  Procedure Laterality Date  . ESOPHAGOGASTRODUODENOSCOPY (EGD) WITH PROPOFOL N/A 04/26/2019   Procedure: ESOPHAGOGASTRODUODENOSCOPY (EGD) WITH PROPOFOL;  Surgeon: Lin Landsman, MD;  Location: [facility];  Service: Gastroenterology;  Laterality: N/A;    Current Outpatient Medications:  .  amLODipine (NORVASC) 10 MG tablet, Take 1 tablet (10 mg total) by mouth daily., Disp: 90 tablet, Rfl: 3 .  aspirin 81 MG EC tablet, Take 1 tablet (81 mg total) by mouth daily., Disp: 30 tablet, Rfl:  .  atorvastatin (LIPITOR) 80 MG tablet, Take 1 tablet (80 mg total) by mouth every evening., Disp: 30 tablet, Rfl: 0 .  Blood Glucose Monitoring Suppl (FIFTY50 GLUCOSE METER 2.0) w/Device KIT, Use as instructed, Disp: , Rfl:  .  clopidogrel (PLAVIX) 75 MG tablet, Take 1 tablet (75 mg total) by mouth daily., Disp: 30 tablet, Rfl: 1 .  gabapentin (NEURONTIN) 300 MG capsule, TAKE 2 CAPSULES (600MG TOTAL) BY MOUTH TWICE A DAY, Disp: 60 capsule, Rfl: 1 .  glipiZIDE (GLUCOTROL) 5 MG tablet, TAKE 1 TABLET BY MOUTH DAILY BEFORE BREAKFAST, Disp: 30 tablet, Rfl: 0 .  hydrochlorothiazide (HYDRODIURIL) 12.5 MG tablet, Take 1 tablet (12.5 mg total) by mouth every morning., Disp: 90 tablet, Rfl: 3 .  Insulin Detemir (LEVEMIR) 100 UNIT/ML Pen, Inject 20 Units into the skin daily., Disp: 15 mL, Rfl: 11 .  lisinopril (PRINIVIL,ZESTRIL) 40 MG  tablet, Take 1 tablet (40 mg total) by mouth daily., Disp: 30 tablet, Rfl: 1 .  pantoprazole (PROTONIX) 40 MG tablet, Take 1 tablet (40 mg total) by mouth daily before breakfast., Disp: 30 tablet, Rfl: 2 .  tiZANidine (ZANAFLEX) 2 MG tablet, Take 1 tablet (2 mg total) by mouth at bedtime as needed for muscle spasms (neck pain)., Disp: 30 tablet, Rfl: 0 .  acetaminophen (TYLENOL) 325 MG tablet, Take 2 tablets (650 mg total) by mouth every 4 (four) hours as needed for mild pain  (or temp > 37.5 C (99.5 F)). (Patient not taking: Reported on 06/20/2019), Disp: , Rfl:    Family History  Problem Relation Age of Onset  . Hypertension Mother   . Hypertension Father   . Diabetes Father      Social History   Tobacco Use  . Smoking status: Former Smoker    Types: Cigarettes    Start date: 1982  . Smokeless tobacco: Never Used  Substance Use Topics  . Alcohol use: Yes    Alcohol/week: 1.0 standard drinks    Types: 1 Cans of beer per week  . Drug use: No    Allergies as of 06/20/2019  . (No Known Allergies)    Review of Systems:    All systems reviewed and negative except where noted in HPI.   Physical Exam:  BP (!) 165/96 (BP Location: Left Arm, Patient Position: Sitting, Cuff Size: Large)   Pulse 86   Temp (!) 97.4 F (36.3 C)   Resp 17   Ht 5' (1.524 m)   Wt 162 lb 9.6 oz (73.8 kg)   BMI 31.76 kg/m  No LMP for male patient.  General:   Alert,  Well-developed, well-nourished, pleasant and cooperative in NAD Head:  Normocephalic and atraumatic. Eyes:  Sclera clear, no icterus.   Conjunctiva pink. Ears:  Normal auditory acuity. Nose:  No deformity, discharge, or lesions. Mouth:  No deformity or lesions,oropharynx pink & moist. Neck:  Supple; no masses or thyromegaly. Lungs:  Respirations even and unlabored.  Clear throughout to auscultation.   No wheezes, crackles, or rhonchi. No acute distress. Heart:  Regular rate and rhythm; no murmurs, clicks, rubs, or gallops. Abdomen:  Normal bowel sounds. Soft, non-tender and non-distended without masses, hepatosplenomegaly or hernias noted.  No guarding or rebound tenderness.   Rectal: Not performed Msk:  Symmetrical without gross deformities. Good, equal movement & strength bilaterally. Pulses:  Normal pulses noted. Extremities:  No clubbing or edema.  No cyanosis. Neurologic:  Alert and oriented x3; left-sided weakness, slurred speech Skin:  Intact without significant lesions or rashes. No jaundice.  Psych:  Alert and cooperative. Normal mood and affect.  Imaging Studies: No recent abdominal imaging  Assessment and Plan:   ZAKIAH BECKERMAN Sr. is a 63 y.o. male with history of diabetes, CVA on aspirin and Plavix seen in consultation for chronic symptoms of burping, regurgitation, epigastric discomfort, heartburn, nausea and vomiting consistent with diabetic gastroparesis.  EGD was unremarkable except moderate amount of solid food in stomach  Dyspepsia Likely gastroparesis Gastroparesis diet, information provided Trial of Reglan 5 mg before each meal and bedtime for 2 weeks Recommend gastric emptying study Advised him to completely eliminate hamburger and sodas Continue Protonix 40 mg daily before breakfast   Follow up in 2 months   Cephas Darby, MD

## 2019-06-21 DIAGNOSIS — E1165 Type 2 diabetes mellitus with hyperglycemia: Secondary | ICD-10-CM | POA: Diagnosis not present

## 2019-06-22 DIAGNOSIS — E1165 Type 2 diabetes mellitus with hyperglycemia: Secondary | ICD-10-CM | POA: Diagnosis not present

## 2019-06-23 DIAGNOSIS — E1165 Type 2 diabetes mellitus with hyperglycemia: Secondary | ICD-10-CM | POA: Diagnosis not present

## 2019-06-24 DIAGNOSIS — E1165 Type 2 diabetes mellitus with hyperglycemia: Secondary | ICD-10-CM | POA: Diagnosis not present

## 2019-06-25 DIAGNOSIS — E1165 Type 2 diabetes mellitus with hyperglycemia: Secondary | ICD-10-CM | POA: Diagnosis not present

## 2019-06-26 DIAGNOSIS — E1165 Type 2 diabetes mellitus with hyperglycemia: Secondary | ICD-10-CM | POA: Diagnosis not present

## 2019-06-27 DIAGNOSIS — E1165 Type 2 diabetes mellitus with hyperglycemia: Secondary | ICD-10-CM | POA: Diagnosis not present

## 2019-06-28 DIAGNOSIS — E1165 Type 2 diabetes mellitus with hyperglycemia: Secondary | ICD-10-CM | POA: Diagnosis not present

## 2019-06-29 DIAGNOSIS — E1165 Type 2 diabetes mellitus with hyperglycemia: Secondary | ICD-10-CM | POA: Diagnosis not present

## 2019-06-30 DIAGNOSIS — E1165 Type 2 diabetes mellitus with hyperglycemia: Secondary | ICD-10-CM | POA: Diagnosis not present

## 2019-07-01 DIAGNOSIS — E1165 Type 2 diabetes mellitus with hyperglycemia: Secondary | ICD-10-CM | POA: Diagnosis not present

## 2019-07-02 DIAGNOSIS — E1165 Type 2 diabetes mellitus with hyperglycemia: Secondary | ICD-10-CM | POA: Diagnosis not present

## 2019-07-03 ENCOUNTER — Other Ambulatory Visit: Payer: Self-pay | Admitting: Family Medicine

## 2019-07-03 DIAGNOSIS — M542 Cervicalgia: Secondary | ICD-10-CM

## 2019-07-03 DIAGNOSIS — Z794 Long term (current) use of insulin: Secondary | ICD-10-CM

## 2019-07-03 DIAGNOSIS — M792 Neuralgia and neuritis, unspecified: Secondary | ICD-10-CM

## 2019-07-03 DIAGNOSIS — E1165 Type 2 diabetes mellitus with hyperglycemia: Secondary | ICD-10-CM | POA: Diagnosis not present

## 2019-07-03 DIAGNOSIS — E1159 Type 2 diabetes mellitus with other circulatory complications: Secondary | ICD-10-CM

## 2019-07-03 DIAGNOSIS — M79605 Pain in left leg: Secondary | ICD-10-CM

## 2019-07-03 NOTE — Telephone Encounter (Signed)
Please schedule patient for follow up in the next 7 days.  

## 2019-07-04 DIAGNOSIS — E1165 Type 2 diabetes mellitus with hyperglycemia: Secondary | ICD-10-CM | POA: Diagnosis not present

## 2019-07-05 DIAGNOSIS — E1165 Type 2 diabetes mellitus with hyperglycemia: Secondary | ICD-10-CM | POA: Diagnosis not present

## 2019-07-06 DIAGNOSIS — E1165 Type 2 diabetes mellitus with hyperglycemia: Secondary | ICD-10-CM | POA: Diagnosis not present

## 2019-07-07 DIAGNOSIS — E1165 Type 2 diabetes mellitus with hyperglycemia: Secondary | ICD-10-CM | POA: Diagnosis not present

## 2019-07-08 DIAGNOSIS — E1165 Type 2 diabetes mellitus with hyperglycemia: Secondary | ICD-10-CM | POA: Diagnosis not present

## 2019-07-09 DIAGNOSIS — E1165 Type 2 diabetes mellitus with hyperglycemia: Secondary | ICD-10-CM | POA: Diagnosis not present

## 2019-07-10 ENCOUNTER — Telehealth: Payer: Self-pay | Admitting: Gastroenterology

## 2019-07-10 DIAGNOSIS — E1165 Type 2 diabetes mellitus with hyperglycemia: Secondary | ICD-10-CM | POA: Diagnosis not present

## 2019-07-10 NOTE — Telephone Encounter (Signed)
Pt  Is calling to reschedule his procedure

## 2019-07-11 DIAGNOSIS — E1165 Type 2 diabetes mellitus with hyperglycemia: Secondary | ICD-10-CM | POA: Diagnosis not present

## 2019-07-12 DIAGNOSIS — E113413 Type 2 diabetes mellitus with severe nonproliferative diabetic retinopathy with macular edema, bilateral: Secondary | ICD-10-CM | POA: Diagnosis not present

## 2019-07-12 DIAGNOSIS — E1165 Type 2 diabetes mellitus with hyperglycemia: Secondary | ICD-10-CM | POA: Diagnosis not present

## 2019-07-12 DIAGNOSIS — H5213 Myopia, bilateral: Secondary | ICD-10-CM | POA: Diagnosis not present

## 2019-07-13 DIAGNOSIS — E1165 Type 2 diabetes mellitus with hyperglycemia: Secondary | ICD-10-CM | POA: Diagnosis not present

## 2019-07-14 DIAGNOSIS — E1165 Type 2 diabetes mellitus with hyperglycemia: Secondary | ICD-10-CM | POA: Diagnosis not present

## 2019-07-15 DIAGNOSIS — E1165 Type 2 diabetes mellitus with hyperglycemia: Secondary | ICD-10-CM | POA: Diagnosis not present

## 2019-07-16 DIAGNOSIS — E1165 Type 2 diabetes mellitus with hyperglycemia: Secondary | ICD-10-CM | POA: Diagnosis not present

## 2019-07-17 ENCOUNTER — Other Ambulatory Visit: Payer: Self-pay | Admitting: Family Medicine

## 2019-07-17 DIAGNOSIS — E1165 Type 2 diabetes mellitus with hyperglycemia: Secondary | ICD-10-CM | POA: Diagnosis not present

## 2019-07-18 ENCOUNTER — Other Ambulatory Visit: Payer: Self-pay

## 2019-07-18 ENCOUNTER — Encounter: Payer: Self-pay | Admitting: Family Medicine

## 2019-07-18 ENCOUNTER — Ambulatory Visit (INDEPENDENT_AMBULATORY_CARE_PROVIDER_SITE_OTHER): Payer: Medicaid Other | Admitting: Family Medicine

## 2019-07-18 DIAGNOSIS — K2100 Gastro-esophageal reflux disease with esophagitis, without bleeding: Secondary | ICD-10-CM | POA: Diagnosis not present

## 2019-07-18 DIAGNOSIS — M542 Cervicalgia: Secondary | ICD-10-CM | POA: Diagnosis not present

## 2019-07-18 DIAGNOSIS — I10 Essential (primary) hypertension: Secondary | ICD-10-CM

## 2019-07-18 DIAGNOSIS — E1159 Type 2 diabetes mellitus with other circulatory complications: Secondary | ICD-10-CM

## 2019-07-18 DIAGNOSIS — I69354 Hemiplegia and hemiparesis following cerebral infarction affecting left non-dominant side: Secondary | ICD-10-CM

## 2019-07-18 DIAGNOSIS — D689 Coagulation defect, unspecified: Secondary | ICD-10-CM | POA: Diagnosis not present

## 2019-07-18 DIAGNOSIS — E785 Hyperlipidemia, unspecified: Secondary | ICD-10-CM | POA: Diagnosis not present

## 2019-07-18 DIAGNOSIS — M792 Neuralgia and neuritis, unspecified: Secondary | ICD-10-CM | POA: Diagnosis not present

## 2019-07-18 DIAGNOSIS — M79605 Pain in left leg: Secondary | ICD-10-CM

## 2019-07-18 DIAGNOSIS — Z794 Long term (current) use of insulin: Secondary | ICD-10-CM

## 2019-07-18 DIAGNOSIS — E1165 Type 2 diabetes mellitus with hyperglycemia: Secondary | ICD-10-CM | POA: Diagnosis not present

## 2019-07-18 MED ORDER — OZEMPIC (0.25 OR 0.5 MG/DOSE) 2 MG/1.5ML ~~LOC~~ SOPN: PEN_INJECTOR | SUBCUTANEOUS | 3 refills | Status: DC

## 2019-07-18 MED ORDER — INSULIN DETEMIR 100 UNIT/ML FLEXPEN: 30.0000 [IU] | PEN_INJECTOR | Freq: Every day | SUBCUTANEOUS | 11 refills | Status: DC

## 2019-07-18 MED ORDER — NOVOFINE 32G X 6 MM MISC: 1.0000 | 1 refills | Status: DC

## 2019-07-18 MED ORDER — BACLOFEN 10 MG PO TABS: 10.0000 mg | ORAL_TABLET | Freq: Three times a day (TID) | ORAL | 0 refills | Status: DC

## 2019-07-18 MED ORDER — GLIPIZIDE 5 MG PO TABS: ORAL_TABLET | ORAL | 1 refills | Status: DC

## 2019-07-18 NOTE — Patient Instructions (Signed)
Increase Levemir to 30 units; add ozempic 0.25mg  x4 weeks, then increase to 0.5mg  ongoing.  Schedule follow up with Dr. Marius Ditch and for imaging study through her office  Schedule follow up with Dr. Leonie Man (neurology)  Referral to pain management placed.

## 2019-07-18 NOTE — Progress Notes (Signed)
Name: Evan PERAL Sr.   MRN: [id number]    DOB: Mar 01, 1956   Date:07/18/2019       Progress Note  Subjective  Chief Complaint  No chief complaint on file.   I connected with  Evan SHULER Sr. on 07/18/19 at  2:20 PM EST by telephone and verified that I am speaking with the correct person using two identifiers.  I discussed the limitations, risks, security and privacy concerns of performing an evaluation and management service by telephone and the availability of in person appointments. Staff also discussed with the patient that there may be a patient responsible charge related to this service. Patient Location: Home Provider Location: Office Additional Individuals present: Wife - Evan Townsend - pt asked that I reach out to her after his visit to update her with plan of care, called, no answer, did leave voicemail to call our office back at her convenience.  HPI  HTN: Taking amlodipine 166m, Lisinopril 436m HCTZ 12.66m22m Not checking BP's at home. Denies chest pain, shortness of breath, headaches, blurred vision.  Needs to schedule nurse visit for BP check and labs to evaluate.    HLD: Taking atorvastatin daily, no chest pain, shortness of breath, or myalgias.Unchanged, stable.  Hx Stroke/LEFT Hemiplegia:Stroke in January 2019, affecting LEFT side function; wearing brace on the LEFT LE.Seeing Dr. SetLeonie Manth neurology - last visit was 11/10/2018 - had no-show on 05/25/2019.   Taking Plavix and 71m57mA, on statin.Stable since last check.  No signs or symptoms of bleeding.  - LEFT Ankle Swelling: On side that he had a stroke. Has had mild intermittent swelling for several months, goes away overnight with elevation, wearing leg brace during the day. No tenderness or erythema. - LEFT Hand Pain: Feels tight like it is swollen, even though it is not. He is taking gabapentin, there is no swelling in the upper arm. - Did PT for both the ankle and the hand just after his stroke, but it  did not seem to help at the time, was referred after last visit but did not go.  Wants to try pain management.  Neck andLEFT shoulder pain: Has intermittent flares; current flare ongoing for about a month.  He thinks he sleeps on it wrong sometimes and this causes the pain. He does have late effects of stroke on the LEFT side, muscles are tense and somewhat tender on the trapezius. Has baseline weakness on the LEFT upper extremity. Tizanidine did help last time did not help, we will try baclofen, he wants to be referred to pain management as well which we will do today.  Indigestion: He notes for the last several months he has burping and regurgitation with sour taste.  Trialed 90 days of 20mg82mtonix BID and this did not help. Saw Dr. VangaMarius Ditch never followed up with imaging study that she ordered - he is reminded to do so today. He denies vomiting, blood in stool/dark and tarry stool, difficulty swallowing or abdominal pain. He is taking plavix and 71mg 48m   Diabetes mellitustype 2 - was asked to come in to discuss treatment in detail, but never scheduled follow up from 2 months prior.  BG's have been running 150-200.  Checking feet every day/night?yes - no concerns today Last eye exam:April 2020 Denies: polydipsia, polyphagia, vision changes, or neuropathy. Endorses polyuria. Most recent A1C: Lab Results  Component Value Date   HGBA1C 13.8 (A) 05/04/2019  Last CMP Results :isdue for repeat today Urine Micro UTD?  Yes Current Medication Management: Diabetic Medications: He was told to increase insulin to 25 units, advised to increase to 30 units today.  Taking glipizide 30m daily.  Has taken metformin but gave him significant diarrhea. No family history of thyroid cancer or personal history of thyroid cancer or pancreatitis. Will add Ozempic today. ACEI/ARB:Yes Statin:Yes Aspirin therapy:Yes  Patient Active Problem List   Diagnosis Date Noted  . Coagulation disorder  ([place] 06/12/2019  . Gastroesophageal reflux disease   . Hemiplegia and hemiparesis following cerebral infarction affecting left non-dominant side ([place] 11/25/2018  . 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 RBBB 10/15/2018  . Sinus bradycardia 10/15/2018  . Dysphagia, post-stroke   . Neuropathic pain   . Lacunar infarct, acute ([place] 09/06/2018  . TIA (transient ischemic attack) 09/01/2018  . Microalbuminuria 07/29/2018  . Left leg pain 01/13/2018  . Hyperlipidemia 02/11/2017  . Essential hypertension 11/10/2016  . Diabetes ([facility] 11/10/2016  . Tobacco abuse 08/28/2013    Past Surgical History:  Procedure Laterality Date  . ESOPHAGOGASTRODUODENOSCOPY (EGD) WITH PROPOFOL N/A 04/26/2019   Procedure: ESOPHAGOGASTRODUODENOSCOPY (EGD) WITH PROPOFOL;  Surgeon: VLin Landsman MD;  [place]  Service: Gastroenterology;  Laterality: N/A;    Family History  Problem Relation Age of Onset  . Hypertension Mother   . Hypertension Father   . Diabetes Father     Social History   Socioeconomic History  . Marital status: Married    Spouse name: bPresenter, broadcasting . Number of children: 5  . Years of education: Not on file  . Highest education level: Not on file  Occupational History  . Occupation: unemployed  Social Needs  . Financial resource strain: Very hard  . Food insecurity    Worry: Never true    Inability: Never true  . Transportation needs    Medical: No    Non-medical: No  Tobacco Use  . Smoking status: Former Smoker    Types: Cigarettes    Start date: 1982  . Smokeless tobacco: Never Used  Substance and Sexual Activity  . Alcohol use: Yes    Alcohol/week: 1.0 standard drinks    Types: 1 Cans of beer per week  . Drug use: No  . Sexual activity: Yes  Lifestyle  . Physical activity    Days per week: 0 days    Minutes per session: Not on file  . Stress: Only a little  Relationships  . Social connections    Talks on phone: More than three times a week    Gets  together: Once a week    Attends religious service: More than 4 times per year    Active member of club or organization: No    Attends meetings of clubs or organizations: Never    Relationship status: Married  . Intimate partner violence    Fear of current or ex partner: No    Emotionally abused: No    Physically abused: No    Forced sexual activity: No  Other Topics Concern  . Not on file  Social History Narrative  . Not on file     Current Outpatient Medications:  .  acetaminophen (TYLENOL) 325 MG tablet, Take 2 tablets (650 mg total) by mouth every 4 (four) hours as needed for mild pain (or temp > 37.5 C (99.5 F)). (Patient not taking: Reported on 06/20/2019), Disp: , Rfl:  .  amLODipine (NORVASC) 10 MG tablet, Take 1 tablet (10 mg total) by mouth daily., Disp: 90 tablet, Rfl: 3 .  aspirin 81 MG EC tablet, Take 1 tablet (81 mg total) by mouth daily., Disp: 30 tablet, Rfl:  .  atorvastatin (LIPITOR) 80 MG tablet, Take 1 tablet (80 mg total) by mouth every evening., Disp: 30 tablet, Rfl: 0 .  Blood Glucose Monitoring Suppl (FIFTY50 GLUCOSE METER 2.0) w/Device KIT, Use as instructed, Disp: , Rfl:  .  clopidogrel (PLAVIX) 75 MG tablet, Take 1 tablet (75 mg total) by mouth daily., Disp: 30 tablet, Rfl: 1 .  gabapentin (NEURONTIN) 300 MG capsule, TAKE 2 CAPSULES BY MOUTH TWICE A DAY, Disp: 28 capsule, Rfl: 1 .  glipiZIDE (GLUCOTROL) 5 MG tablet, TAKE 1 TABLET BY MOUTH DAILY BEFORE BREAKFAST, Disp: 7 tablet, Rfl: 0 .  hydrochlorothiazide (HYDRODIURIL) 12.5 MG tablet, Take 1 tablet (12.5 mg total) by mouth every morning., Disp: 90 tablet, Rfl: 3 .  Insulin Detemir (LEVEMIR) 100 UNIT/ML Pen, Inject 20 Units into the skin daily., Disp: 15 mL, Rfl: 11 .  lisinopril (ZESTRIL) 40 MG tablet, TAKE 1 TABLET BY MOUTH DAILY, Disp: 90 tablet, Rfl: 1 .  metoCLOPramide (REGLAN) 5 MG tablet, Take 1 tablet (5 mg total) by mouth 3 (three) times daily before meals for 14 days., Disp: 42 tablet, Rfl: 0 .   pantoprazole (PROTONIX) 40 MG tablet, Take 1 tablet (40 mg total) by mouth daily before breakfast., Disp: 30 tablet, Rfl: 2 .  tiZANidine (ZANAFLEX) 2 MG tablet, TAKE 1 TABLET BY MOUTH AT BEDTIME AS NEEDED FOR MUSCLE SPASMS (NECK PAIN), Disp: 7 tablet, Rfl: 0  No Known Allergies  I personally reviewed active problem list, medication list, allergies, notes from last encounter, lab results with the patient/caregiver today.   ROS  Constitutional: Negative for fever or weight change.  Respiratory: Negative for cough and shortness of breath.   Cardiovascular: Negative for chest pain or palpitations.  Gastrointestinal: Negative for abdominal pain, no bowel changes.  Musculoskeletal: Negative for gait problem or joint swelling.  Skin: Negative for rash.  Neurological: Negative for dizziness or headache.  No other specific complaints in a complete review of systems (except as listed in HPI above).  Objective  Virtual encounter, vitals not obtained.  There is no height or weight on file to calculate BMI.  Physical Exam  Pulmonary/Chest: Effort normal. No respiratory distress. Speaking in complete sentences Neurological: Pt is alert and oriented to person, place, and time. Speech is dysarthric - this is baseline for the patient s/p stroke Psychiatric: Patient has a normal mood and affect. behavior is normal. Judgment and thought content normal.  No results found for this or any previous visit (from the past 72 hour(s)).  PHQ2/9: Depression screen [facility] 2/9 07/18/2019 04/14/2019 01/31/2019 11/25/2018  Decreased Interest 0 0 0 0  Down, Depressed, Hopeless 0 0 0 0  PHQ - 2 Score 0 0 0 0  Altered sleeping 0 0 0 0  Tired, decreased energy 0 0 0 0  Change in appetite 0 0 0 0  Feeling bad or failure about yourself  0 0 0 0  Trouble concentrating 0 0 0 0  Moving slowly or fidgety/restless 0 0 0 0  Suicidal thoughts 0 0 0 0  PHQ-9 Score 0 0 0 0  Difficult doing work/chores - Not difficult at all  Not difficult at all Not difficult at all   PHQ-2/9 Result is negative.    Fall Risk: Fall Risk  07/18/2019 04/14/2019 01/31/2019 01/31/2019 12/06/2018  Falls in the past year? 0 0 0 0 0  Number falls in past  yr: 0 0 0 0 -  Injury with Fall? 0 0 0 0 -  Follow up - - - - -    Assessment & Plan  1. Type 2 diabetes mellitus with other circulatory complication, with long-term current use of insulin (HCC) - Increase levemir to 30 units, add Ozempic, continue glipizide for now. - Insulin Detemir (LEVEMIR) 100 UNIT/ML Pen; Inject 30 Units into the skin daily.  Dispense: 15 mL; Refill: 11 - Semaglutide,0.25 or 0.5MG/DOS, (OZEMPIC, 0.25 OR 0.5 MG/DOSE,) 2 MG/1.5ML SOPN; Inject 0.25 mg into the skin once a week for 28 days, THEN 0.5 mg once a week.  Dispense: 3 pen; Refill: 3 - glipiZIDE (GLUCOTROL) 5 MG tablet; TAKE 1 TABLET BY MOUTH DAILY BEFORE BREAKFAST  Dispense: 90 tablet; Refill: 1 - Insulin Pen Needle (NOVOFINE) 32G X 6 MM MISC; 1 each by Does not apply route once a week.  Dispense: 100 each; Refill: 1  2. Neck pain on left side - baclofen (LIORESAL) 10 MG tablet; Take 1 tablet (10 mg total) by mouth 3 (three) times daily.  Dispense: 30 each; Refill: 0 - Ambulatory referral to Pain Clinic  3. Left leg pain - baclofen (LIORESAL) 10 MG tablet; Take 1 tablet (10 mg total) by mouth 3 (three) times daily.  Dispense: 30 each; Refill: 0 - Ambulatory referral to Pain Clinic  4. Neuropathic pain - baclofen (LIORESAL) 10 MG tablet; Take 1 tablet (10 mg total) by mouth 3 (three) times daily.  Dispense: 30 each; Refill: 0 - Ambulatory referral to Pain Clinic  5. Essential hypertension - Needs to come in 2 weeks for BP check in office  6. Hemiplegia and hemiparesis following cerebral infarction affecting left non-dominant side ([place]) - Ambulatory referral to Pain Clinic - Continue plavix and ASA; needs follow up with neurology  7. Hyperlipidemia, unspecified hyperlipidemia type - Continue statin  therapy  8. Gastroesophageal reflux disease with esophagitis without hemorrhage - Needs to follow up with Dr. Marius Ditch  9. Coagulation disorder (HCC) - Continue plavix.  I discussed the assessment and treatment plan with the patient. The patient was provided an opportunity to ask questions and all were answered. The patient agreed with the plan and demonstrated an understanding of the instructions.   The patient was advised to call back or seek an in-person evaluation if the symptoms worsen or if the condition fails to improve as anticipated.  I provided 24 minutes of non-face-to-face time during this encounter.  Hubbard Hartshorn, FNP

## 2019-07-19 DIAGNOSIS — E1165 Type 2 diabetes mellitus with hyperglycemia: Secondary | ICD-10-CM | POA: Diagnosis not present

## 2019-07-20 DIAGNOSIS — E1165 Type 2 diabetes mellitus with hyperglycemia: Secondary | ICD-10-CM | POA: Diagnosis not present

## 2019-07-21 DIAGNOSIS — E1165 Type 2 diabetes mellitus with hyperglycemia: Secondary | ICD-10-CM | POA: Diagnosis not present

## 2019-07-22 DIAGNOSIS — E1165 Type 2 diabetes mellitus with hyperglycemia: Secondary | ICD-10-CM | POA: Diagnosis not present

## 2019-07-23 DIAGNOSIS — E1165 Type 2 diabetes mellitus with hyperglycemia: Secondary | ICD-10-CM | POA: Diagnosis not present

## 2019-07-24 DIAGNOSIS — E1165 Type 2 diabetes mellitus with hyperglycemia: Secondary | ICD-10-CM | POA: Diagnosis not present

## 2019-07-25 DIAGNOSIS — E1165 Type 2 diabetes mellitus with hyperglycemia: Secondary | ICD-10-CM | POA: Diagnosis not present

## 2019-07-26 ENCOUNTER — Other Ambulatory Visit: Payer: Self-pay | Admitting: Family Medicine

## 2019-07-26 DIAGNOSIS — E1165 Type 2 diabetes mellitus with hyperglycemia: Secondary | ICD-10-CM | POA: Diagnosis not present

## 2019-07-27 DIAGNOSIS — E1165 Type 2 diabetes mellitus with hyperglycemia: Secondary | ICD-10-CM | POA: Diagnosis not present

## 2019-07-28 DIAGNOSIS — E1165 Type 2 diabetes mellitus with hyperglycemia: Secondary | ICD-10-CM | POA: Diagnosis not present

## 2019-07-29 DIAGNOSIS — E1165 Type 2 diabetes mellitus with hyperglycemia: Secondary | ICD-10-CM | POA: Diagnosis not present

## 2019-07-30 DIAGNOSIS — E1165 Type 2 diabetes mellitus with hyperglycemia: Secondary | ICD-10-CM | POA: Diagnosis not present

## 2019-07-31 DIAGNOSIS — E1165 Type 2 diabetes mellitus with hyperglycemia: Secondary | ICD-10-CM | POA: Diagnosis not present

## 2019-08-01 DIAGNOSIS — E1165 Type 2 diabetes mellitus with hyperglycemia: Secondary | ICD-10-CM | POA: Diagnosis not present

## 2019-08-02 DIAGNOSIS — E1165 Type 2 diabetes mellitus with hyperglycemia: Secondary | ICD-10-CM | POA: Diagnosis not present

## 2019-08-03 DIAGNOSIS — E1165 Type 2 diabetes mellitus with hyperglycemia: Secondary | ICD-10-CM | POA: Diagnosis not present

## 2019-08-04 DIAGNOSIS — E1165 Type 2 diabetes mellitus with hyperglycemia: Secondary | ICD-10-CM | POA: Diagnosis not present

## 2019-08-05 DIAGNOSIS — E1165 Type 2 diabetes mellitus with hyperglycemia: Secondary | ICD-10-CM | POA: Diagnosis not present

## 2019-08-06 DIAGNOSIS — E1165 Type 2 diabetes mellitus with hyperglycemia: Secondary | ICD-10-CM | POA: Diagnosis not present

## 2019-08-07 ENCOUNTER — Telehealth: Payer: Self-pay | Admitting: Family Medicine

## 2019-08-07 ENCOUNTER — Other Ambulatory Visit: Payer: Self-pay | Admitting: Family Medicine

## 2019-08-07 DIAGNOSIS — M792 Neuralgia and neuritis, unspecified: Secondary | ICD-10-CM

## 2019-08-07 DIAGNOSIS — E1165 Type 2 diabetes mellitus with hyperglycemia: Secondary | ICD-10-CM | POA: Diagnosis not present

## 2019-08-07 DIAGNOSIS — M542 Cervicalgia: Secondary | ICD-10-CM

## 2019-08-07 DIAGNOSIS — M79605 Pain in left leg: Secondary | ICD-10-CM

## 2019-08-07 MED ORDER — GABAPENTIN 300 MG PO CAPS: 600.0000 mg | ORAL_CAPSULE | Freq: Two times a day (BID) | ORAL | 1 refills | Status: DC

## 2019-08-07 NOTE — Telephone Encounter (Signed)
Insulin Detemir (LEVEMIR) 100 UNIT/ML Pen  gabapentin (NEURONTIN) 300 MG capsule    Patient's wife states that pharmacy does not have refills on these RX. She also requests a "stronger" pain medication and muscle relaxer (she was unable to remember names of medications). Patient's wife is also requesting call back from CMA asap. Please advise.    Pharmacy:  [facility], [place] Phone:  [phone]  Fax:  [phone]

## 2019-08-07 NOTE — Telephone Encounter (Signed)
Has been  trying to call numbers on file to let patient know he has 11 refills on Levemir and 2 on neurontin

## 2019-08-07 NOTE — Telephone Encounter (Signed)
Requested medication (s) are due for refill today: {Yes  Requested medication (s) are on the active medication list: {Yes  Last refill:  07/03/19  Future visit scheduled: no  Notes to clinic:  medication not delegated to NT to refill   Requested Prescriptions  Pending Prescriptions Disp Refills   tiZANidine (ZANAFLEX) 2 MG tablet [Pharmacy Med Name: TIZANIDINE HCL 2 MG TAB] 7 tablet 0    Sig: TAKE 1 TABLET BY MOUTH AT BEDTIME AS NEEDED FOR MUSCLE SPASMS (NECK PAIN)      Not Delegated - Cardiovascular:  Alpha-2 Agonists - tizanidine Failed - 08/07/2019 11:13 AM      Failed - This refill cannot be delegated      Passed - Valid encounter within last 6 months    Recent Outpatient Visits           2 weeks ago Essential hypertension   [place], [place], FNP   3 months ago Hemiplegia and hemiparesis following cerebral infarction affecting left non-dominant side [facility])   [place], FNP   6 months ago Neck pain on left side   [place], FNP   8 months ago Gastroesophageal reflux disease without esophagitis   [place], Astrid Divine, [place]

## 2019-08-08 DIAGNOSIS — E1165 Type 2 diabetes mellitus with hyperglycemia: Secondary | ICD-10-CM | POA: Diagnosis not present

## 2019-08-09 ENCOUNTER — Other Ambulatory Visit: Payer: Self-pay | Admitting: Emergency Medicine

## 2019-08-09 ENCOUNTER — Telehealth: Payer: Self-pay | Admitting: Family Medicine

## 2019-08-09 DIAGNOSIS — M79605 Pain in left leg: Secondary | ICD-10-CM

## 2019-08-09 DIAGNOSIS — I69354 Hemiplegia and hemiparesis following cerebral infarction affecting left non-dominant side: Secondary | ICD-10-CM

## 2019-08-09 DIAGNOSIS — E1165 Type 2 diabetes mellitus with hyperglycemia: Secondary | ICD-10-CM | POA: Diagnosis not present

## 2019-08-09 DIAGNOSIS — R471 Dysarthria and anarthria: Secondary | ICD-10-CM

## 2019-08-09 DIAGNOSIS — M542 Cervicalgia: Secondary | ICD-10-CM

## 2019-08-09 DIAGNOSIS — E1159 Type 2 diabetes mellitus with other circulatory complications: Secondary | ICD-10-CM

## 2019-08-09 MED ORDER — INSULIN DETEMIR 100 UNIT/ML FLEXPEN: 30.0000 [IU] | PEN_INJECTOR | Freq: Every day | SUBCUTANEOUS | 11 refills | Status: DC

## 2019-08-09 NOTE — Telephone Encounter (Signed)
Please put in order

## 2019-08-09 NOTE — Telephone Encounter (Signed)
Pharmacy also ask for refill on Tizandine 2mg  at bedtime for muscle spasm

## 2019-08-09 NOTE — Telephone Encounter (Signed)
No refill for tizanidine - he is prescribed baclofen.

## 2019-08-09 NOTE — Telephone Encounter (Signed)
Pt wife would like a order for her husband to having PT to learn how to walking and use his hands and speech therapy to learn had to talk again. Pt had a stroke

## 2019-08-09 NOTE — Telephone Encounter (Signed)
Pt rx for levemir  was print and not e-scribed to pharm.

## 2019-08-09 NOTE — Telephone Encounter (Signed)
I referred to pain management at last visit - I am happy to find alternative if not able to schedule with Dr. Holley Raring.   I referred to PT in August, but he did not go.  I will place new order today, but please advise they must actually attend.  Often there are no-show policies in place that only allow a certain number of no-shows before the practice refuses to see the patient.

## 2019-08-09 NOTE — Telephone Encounter (Signed)
Called - left message

## 2019-08-09 NOTE — Telephone Encounter (Signed)
I am happy to refer to pain management.  He is on gabapentin and muscle relaxer.   I am not going to prescribe anything stronger than these medications.  He is also due to see his neurologist for follow up - needs to schedule with Dr. Leonie Man and may want to discuss his pain as secondary to his stroke with him as well.

## 2019-08-09 NOTE — Telephone Encounter (Signed)
Called left message for patient

## 2019-08-09 NOTE — Telephone Encounter (Signed)
I called [facility] Pullin script in to [facility] per your directions on 07/18/19. 30 units every day with 11 refills. The family had been by once and was there waiting

## 2019-08-10 DIAGNOSIS — E1165 Type 2 diabetes mellitus with hyperglycemia: Secondary | ICD-10-CM | POA: Diagnosis not present

## 2019-08-12 DIAGNOSIS — E1165 Type 2 diabetes mellitus with hyperglycemia: Secondary | ICD-10-CM | POA: Diagnosis not present

## 2019-08-13 DIAGNOSIS — E1165 Type 2 diabetes mellitus with hyperglycemia: Secondary | ICD-10-CM | POA: Diagnosis not present

## 2019-08-14 ENCOUNTER — Other Ambulatory Visit: Payer: Self-pay | Admitting: Family Medicine

## 2019-08-14 DIAGNOSIS — M542 Cervicalgia: Secondary | ICD-10-CM

## 2019-08-14 DIAGNOSIS — M79605 Pain in left leg: Secondary | ICD-10-CM

## 2019-08-14 DIAGNOSIS — M792 Neuralgia and neuritis, unspecified: Secondary | ICD-10-CM

## 2019-08-14 DIAGNOSIS — E1165 Type 2 diabetes mellitus with hyperglycemia: Secondary | ICD-10-CM | POA: Diagnosis not present

## 2019-08-14 NOTE — Telephone Encounter (Signed)
Requested medication (s) are due for refill today: yes  Requested medication (s) are on the active medication list: yes  Last refill:  07/18/2019  Future visit scheduled: no  Notes to clinic:  This refill cannot be delegated    Requested Prescriptions  Pending Prescriptions Disp Refills   baclofen (LIORESAL) 10 MG tablet [Pharmacy Med Name: BACLOFEN 10 MG TAB] 30 each 0    Sig: TAKE 1 TABLET BY MOUTH 3 TIMES DAILY      Not Delegated - Analgesics:  Muscle Relaxants Failed - 08/14/2019  1:48 PM      Failed - This refill cannot be delegated      Passed - Valid encounter within last 6 months    Recent Outpatient Visits           3 weeks ago Essential hypertension   [place], [place], FNP   4 months ago Hemiplegia and hemiparesis following cerebral infarction affecting left non-dominant side [facility])   [place], FNP   6 months ago Neck pain on left side   [place], FNP   8 months ago Gastroesophageal reflux disease without esophagitis   [place], FNP                tiZANidine (ZANAFLEX) 2 MG tablet [Pharmacy Med Name: TIZANIDINE HCL 2 MG TAB] 30 tablet 0    Sig: TAKE 1 TABLET BY MOUTH AT BEDTIME AS NEEDED FOR MUSCLE SPASMS (NECK PAIN).      Not Delegated - Cardiovascular:  Alpha-2 Agonists - tizanidine Failed - 08/14/2019  1:48 PM      Failed - This refill cannot be delegated      Passed - Valid encounter within last 6 months    Recent Outpatient Visits           3 weeks ago Essential hypertension   [place], [place], FNP   4 months ago Hemiplegia and hemiparesis following cerebral infarction affecting left non-dominant side [facility])   [place], FNP   6 months ago Neck pain on left side   [place], FNP   8 months ago  Gastroesophageal reflux disease without esophagitis   [place], [place]

## 2019-08-15 DIAGNOSIS — E1165 Type 2 diabetes mellitus with hyperglycemia: Secondary | ICD-10-CM | POA: Diagnosis not present

## 2019-08-16 DIAGNOSIS — E1165 Type 2 diabetes mellitus with hyperglycemia: Secondary | ICD-10-CM | POA: Diagnosis not present

## 2019-08-17 DIAGNOSIS — E1165 Type 2 diabetes mellitus with hyperglycemia: Secondary | ICD-10-CM | POA: Diagnosis not present

## 2019-08-18 DIAGNOSIS — E1165 Type 2 diabetes mellitus with hyperglycemia: Secondary | ICD-10-CM | POA: Diagnosis not present

## 2019-08-19 DIAGNOSIS — E1165 Type 2 diabetes mellitus with hyperglycemia: Secondary | ICD-10-CM | POA: Diagnosis not present

## 2019-08-20 DIAGNOSIS — E1165 Type 2 diabetes mellitus with hyperglycemia: Secondary | ICD-10-CM | POA: Diagnosis not present

## 2019-08-21 ENCOUNTER — Ambulatory Visit: Payer: Medicaid Other | Admitting: Podiatry

## 2019-08-21 DIAGNOSIS — E1165 Type 2 diabetes mellitus with hyperglycemia: Secondary | ICD-10-CM | POA: Diagnosis not present

## 2019-08-22 DIAGNOSIS — H524 Presbyopia: Secondary | ICD-10-CM | POA: Diagnosis not present

## 2019-08-22 DIAGNOSIS — E113413 Type 2 diabetes mellitus with severe nonproliferative diabetic retinopathy with macular edema, bilateral: Secondary | ICD-10-CM | POA: Diagnosis not present

## 2019-08-22 DIAGNOSIS — E1165 Type 2 diabetes mellitus with hyperglycemia: Secondary | ICD-10-CM | POA: Diagnosis not present

## 2019-08-23 DIAGNOSIS — E1165 Type 2 diabetes mellitus with hyperglycemia: Secondary | ICD-10-CM | POA: Diagnosis not present

## 2019-08-24 DIAGNOSIS — E1165 Type 2 diabetes mellitus with hyperglycemia: Secondary | ICD-10-CM | POA: Diagnosis not present

## 2019-08-25 DIAGNOSIS — E1165 Type 2 diabetes mellitus with hyperglycemia: Secondary | ICD-10-CM | POA: Diagnosis not present

## 2019-08-26 DIAGNOSIS — E1165 Type 2 diabetes mellitus with hyperglycemia: Secondary | ICD-10-CM | POA: Diagnosis not present

## 2019-09-04 ENCOUNTER — Ambulatory Visit: Payer: Medicaid Other | Admitting: Speech Pathology

## 2019-09-04 ENCOUNTER — Other Ambulatory Visit: Payer: Self-pay

## 2019-09-04 ENCOUNTER — Inpatient Hospital Stay
Admission: EM | Admit: 2019-09-04 | Discharge: 2019-09-06 | DRG: 638 | Disposition: A | Discharge status: Home / Self Care | Payer: Medicaid Other | Attending: Internal Medicine | Admitting: Internal Medicine

## 2019-09-04 ENCOUNTER — Emergency Department: Payer: Medicaid Other

## 2019-09-04 ENCOUNTER — Other Ambulatory Visit: Payer: Self-pay | Admitting: Family Medicine

## 2019-09-04 ENCOUNTER — Other Ambulatory Visit: Payer: Self-pay | Admitting: Gastroenterology

## 2019-09-04 ENCOUNTER — Ambulatory Visit: Payer: Medicaid Other | Attending: Family Medicine

## 2019-09-04 DIAGNOSIS — E1110 Type 2 diabetes mellitus with ketoacidosis without coma: Principal | ICD-10-CM | POA: Diagnosis present

## 2019-09-04 DIAGNOSIS — R32 Unspecified urinary incontinence: Secondary | ICD-10-CM | POA: Diagnosis present

## 2019-09-04 DIAGNOSIS — R079 Chest pain, unspecified: Secondary | ICD-10-CM | POA: Diagnosis present

## 2019-09-04 DIAGNOSIS — Z8249 Family history of ischemic heart disease and other diseases of the circulatory system: Secondary | ICD-10-CM

## 2019-09-04 DIAGNOSIS — E7800 Pure hypercholesterolemia, unspecified: Secondary | ICD-10-CM | POA: Diagnosis present

## 2019-09-04 DIAGNOSIS — K219 Gastro-esophageal reflux disease without esophagitis: Secondary | ICD-10-CM

## 2019-09-04 DIAGNOSIS — N179 Acute kidney failure, unspecified: Secondary | ICD-10-CM | POA: Diagnosis present

## 2019-09-04 DIAGNOSIS — M792 Neuralgia and neuritis, unspecified: Secondary | ICD-10-CM

## 2019-09-04 DIAGNOSIS — D649 Anemia, unspecified: Secondary | ICD-10-CM

## 2019-09-04 DIAGNOSIS — F17210 Nicotine dependence, cigarettes, uncomplicated: Secondary | ICD-10-CM | POA: Diagnosis present

## 2019-09-04 DIAGNOSIS — E1151 Type 2 diabetes mellitus with diabetic peripheral angiopathy without gangrene: Secondary | ICD-10-CM | POA: Diagnosis present

## 2019-09-04 DIAGNOSIS — Z7902 Long term (current) use of antithrombotics/antiplatelets: Secondary | ICD-10-CM

## 2019-09-04 DIAGNOSIS — M79605 Pain in left leg: Secondary | ICD-10-CM

## 2019-09-04 DIAGNOSIS — I110 Hypertensive heart disease with heart failure: Secondary | ICD-10-CM | POA: Diagnosis present

## 2019-09-04 DIAGNOSIS — E1169 Type 2 diabetes mellitus with other specified complication: Secondary | ICD-10-CM

## 2019-09-04 DIAGNOSIS — E785 Hyperlipidemia, unspecified: Secondary | ICD-10-CM | POA: Diagnosis present

## 2019-09-04 DIAGNOSIS — E1165 Type 2 diabetes mellitus with hyperglycemia: Secondary | ICD-10-CM | POA: Diagnosis present

## 2019-09-04 DIAGNOSIS — N189 Chronic kidney disease, unspecified: Secondary | ICD-10-CM

## 2019-09-04 DIAGNOSIS — R0789 Other chest pain: Secondary | ICD-10-CM | POA: Diagnosis not present

## 2019-09-04 DIAGNOSIS — D638 Anemia in other chronic diseases classified elsewhere: Secondary | ICD-10-CM

## 2019-09-04 DIAGNOSIS — I6523 Occlusion and stenosis of bilateral carotid arteries: Secondary | ICD-10-CM | POA: Diagnosis present

## 2019-09-04 DIAGNOSIS — M542 Cervicalgia: Secondary | ICD-10-CM | POA: Diagnosis present

## 2019-09-04 DIAGNOSIS — Z20822 Contact with and (suspected) exposure to covid-19: Secondary | ICD-10-CM | POA: Diagnosis present

## 2019-09-04 DIAGNOSIS — R739 Hyperglycemia, unspecified: Secondary | ICD-10-CM

## 2019-09-04 DIAGNOSIS — Z8673 Personal history of transient ischemic attack (TIA), and cerebral infarction without residual deficits: Secondary | ICD-10-CM | POA: Diagnosis not present

## 2019-09-04 DIAGNOSIS — R471 Dysarthria and anarthria: Secondary | ICD-10-CM

## 2019-09-04 DIAGNOSIS — I10 Essential (primary) hypertension: Secondary | ICD-10-CM | POA: Diagnosis present

## 2019-09-04 DIAGNOSIS — R2681 Unsteadiness on feet: Secondary | ICD-10-CM | POA: Insufficient documentation

## 2019-09-04 DIAGNOSIS — I69354 Hemiplegia and hemiparesis following cerebral infarction affecting left non-dominant side: Secondary | ICD-10-CM

## 2019-09-04 DIAGNOSIS — J189 Pneumonia, unspecified organism: Secondary | ICD-10-CM | POA: Diagnosis present

## 2019-09-04 DIAGNOSIS — E876 Hypokalemia: Secondary | ICD-10-CM | POA: Diagnosis present

## 2019-09-04 DIAGNOSIS — Z794 Long term (current) use of insulin: Secondary | ICD-10-CM

## 2019-09-04 DIAGNOSIS — R2689 Other abnormalities of gait and mobility: Secondary | ICD-10-CM | POA: Insufficient documentation

## 2019-09-04 DIAGNOSIS — E1142 Type 2 diabetes mellitus with diabetic polyneuropathy: Secondary | ICD-10-CM | POA: Diagnosis present

## 2019-09-04 DIAGNOSIS — E11649 Type 2 diabetes mellitus with hypoglycemia without coma: Secondary | ICD-10-CM

## 2019-09-04 DIAGNOSIS — I4510 Unspecified right bundle-branch block: Secondary | ICD-10-CM | POA: Diagnosis present

## 2019-09-04 DIAGNOSIS — M25512 Pain in left shoulder: Secondary | ICD-10-CM | POA: Diagnosis present

## 2019-09-04 DIAGNOSIS — Z833 Family history of diabetes mellitus: Secondary | ICD-10-CM

## 2019-09-04 DIAGNOSIS — M6281 Muscle weakness (generalized): Secondary | ICD-10-CM | POA: Insufficient documentation

## 2019-09-04 DIAGNOSIS — E119 Type 2 diabetes mellitus without complications: Secondary | ICD-10-CM

## 2019-09-04 DIAGNOSIS — Z79899 Other long term (current) drug therapy: Secondary | ICD-10-CM

## 2019-09-04 DIAGNOSIS — G8929 Other chronic pain: Secondary | ICD-10-CM | POA: Diagnosis present

## 2019-09-04 DIAGNOSIS — E1159 Type 2 diabetes mellitus with other circulatory complications: Secondary | ICD-10-CM | POA: Diagnosis not present

## 2019-09-04 DIAGNOSIS — I509 Heart failure, unspecified: Secondary | ICD-10-CM | POA: Diagnosis present

## 2019-09-04 HISTORY — DX: Tobacco use: Z72.0

## 2019-09-04 HISTORY — DX: Type 2 diabetes mellitus with hyperglycemia: E11.65

## 2019-09-04 HISTORY — DX: Disorder of arteries and arterioles, unspecified: I77.9

## 2019-09-04 HISTORY — DX: Cerebral infarction, unspecified: I63.9

## 2019-09-04 HISTORY — DX: Other ill-defined heart diseases: I51.89

## 2019-09-04 HISTORY — DX: Hemiplegia, unspecified affecting left nondominant side: G81.94

## 2019-09-04 LAB — CBC
HCT: 36.1 % — ABNORMAL LOW (ref 39.0–52.0)
Hemoglobin: 11.9 g/dL — ABNORMAL LOW (ref 13.0–17.0)
MCH: 28.5 pg (ref 26.0–34.0)
MCHC: 33 g/dL (ref 30.0–36.0)
MCV: 86.6 fL (ref 80.0–100.0)
Platelets: 199 10*3/uL (ref 150–400)
RBC: 4.17 MIL/uL — ABNORMAL LOW (ref 4.22–5.81)
RDW: 15.1 % (ref 11.5–15.5)
WBC: 5.7 10*3/uL (ref 4.0–10.5)
nRBC: 0.3 % — ABNORMAL HIGH (ref 0.0–0.2)

## 2019-09-04 LAB — BASIC METABOLIC PANEL
Anion gap: 16 — ABNORMAL HIGH (ref 5–15)
BUN: 27 mg/dL — ABNORMAL HIGH (ref 8–23)
CO2: 23 mmol/L (ref 22–32)
Calcium: 10.2 mg/dL (ref 8.9–10.3)
Chloride: 98 mmol/L (ref 98–111)
Creatinine, Ser: 1.45 mg/dL — ABNORMAL HIGH (ref 0.61–1.24)
GFR calc Af Amer: 59 mL/min — ABNORMAL LOW (ref >60)
GFR calc non Af Amer: 51 mL/min — ABNORMAL LOW (ref >60)
Glucose, Bld: 524 mg/dL (ref 70–99)
Potassium: 3.9 mmol/L (ref 3.5–5.1)
Sodium: 137 mmol/L (ref 135–145)

## 2019-09-04 LAB — TROPONIN I (HIGH SENSITIVITY): Troponin I (High Sensitivity): 6 ng/L (ref <18)

## 2019-09-04 MED ORDER — DEXTROSE-NACL 5-0.45 % IV SOLN: INTRAVENOUS | Status: DC

## 2019-09-04 MED ORDER — DEXTROSE 50 % IV SOLN: 0.0000 mL | INTRAVENOUS | Status: DC | PRN

## 2019-09-04 MED ORDER — INSULIN REGULAR(HUMAN) IN NACL 100-0.9 UT/100ML-% IV SOLN
INTRAVENOUS | Status: DC
  Administered 2019-09-04: 11.5 [IU]/h via INTRAVENOUS
  Administered 2019-09-05: 18 [IU]/h via INTRAVENOUS
  Filled 2019-09-04 (×2): qty 100

## 2019-09-04 MED ORDER — POTASSIUM CHLORIDE 10 MEQ/100ML IV SOLN
10.0000 meq | INTRAVENOUS | Status: AC
  Administered 2019-09-05 (×2): 10 meq via INTRAVENOUS
  Filled 2019-09-04 (×2): qty 100

## 2019-09-04 MED ORDER — SODIUM CHLORIDE 0.9 % IV BOLUS
1000.0000 mL | Freq: Once | INTRAVENOUS | Status: AC
  Administered 2019-09-04: 1000 mL via INTRAVENOUS

## 2019-09-04 MED ORDER — INSULIN ASPART 100 UNIT/ML ~~LOC~~ SOLN: 8.0000 [IU] | Freq: Once | SUBCUTANEOUS | Status: DC

## 2019-09-04 MED ORDER — MORPHINE SULFATE (PF) 4 MG/ML IV SOLN
4.0000 mg | Freq: Once | INTRAVENOUS | Status: AC
  Administered 2019-09-04: 4 mg via INTRAVENOUS
  Filled 2019-09-04: qty 1

## 2019-09-04 NOTE — ED Provider Notes (Signed)
[facility] Emergency Department Provider Note ____________________________________________   First MD Initiated Contact with Patient 09/04/19 2159     (approximate)  I have reviewed the triage vital signs and the nursing notes.   HISTORY  Chief Complaint Chest Pain    HPI Evan KNIERIM Sr. is a 64 y.o. male with PMH as noted below including stroke and diabetes but with no known CAD who presents with chest pain, acute onset this morning and persistent since then, mainly on the left side of his chest, described as feeling as if someone had punched him there.  He denies any shortness of breath, vomiting or diarrhea, cough, or fever.  He states the pain is not worse if he changes positions.  He denies any prior history of this pain.  Past Medical History:  Diagnosis Date  . Acute CVA (cerebrovascular accident) ([place]) 11/10/2016  . Diabetes 1.5, managed as type 2 ([place])   . Diabetes mellitus without complication ([place])   . Hypercholesterolemia   . Hypertension   . Pain in both feet   . Stroke [facility])     Patient Active Problem List   Diagnosis Date Noted  . Coagulation disorder ([place]) 06/12/2019  . Gastroesophageal reflux disease   . Hemiplegia and hemiparesis following cerebral infarction affecting left non-dominant side ([place]) 11/25/2018  . 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 RBBB 10/15/2018  . Sinus bradycardia 10/15/2018  . Dysphagia, post-stroke   . Neuropathic pain   . Lacunar infarct, acute ([place]) 09/06/2018  . TIA (transient ischemic attack) 09/01/2018  . Microalbuminuria 07/29/2018  . Left leg pain 01/13/2018  . Hyperlipidemia 02/11/2017  . Essential hypertension 11/10/2016  . Diabetes ([place]) 11/10/2016  . Tobacco abuse 08/28/2013    Past Surgical History:  Procedure Laterality Date  . ESOPHAGOGASTRODUODENOSCOPY (EGD) WITH PROPOFOL N/A 04/26/2019   Procedure: ESOPHAGOGASTRODUODENOSCOPY (EGD) WITH PROPOFOL;  Surgeon: Lin Landsman, MD;  Location:  [place];  Service: Gastroenterology;  Laterality: N/A;    Prior to Admission medications   Medication Sig Start Date End Date Taking? Authorizing Provider  amLODipine (NORVASC) 10 MG tablet Take 1 tablet (10 mg total) by mouth daily. 04/14/19 04/13/20 Yes Hubbard Hartshorn, FNP  atorvastatin (LIPITOR) 80 MG tablet Take 1 tablet (80 mg total) by mouth every evening. 09/23/18  Yes Angiulli, Lavon Paganini, PA-C  baclofen (LIORESAL) 10 MG tablet TAKE 1 TABLET BY MOUTH 3 TIMES DAILY 08/15/19  Yes Hubbard Hartshorn, FNP  Blood Glucose Monitoring Suppl (FIFTY50 GLUCOSE METER 2.0) w/Device KIT Use as instructed 05/05/18  Yes [provider]  clopidogrel (PLAVIX) 75 MG tablet Take 1 tablet (75 mg total) by mouth daily. 09/23/18  Yes Angiulli, Lavon Paganini, PA-C  gabapentin (NEURONTIN) 300 MG capsule Take 2 capsules (600 mg total) by mouth 2 (two) times daily. 08/07/19  Yes Hubbard Hartshorn, FNP  glipiZIDE (GLUCOTROL) 5 MG tablet TAKE 1 TABLET BY MOUTH DAILY BEFORE BREAKFAST 07/18/19  Yes Hubbard Hartshorn, FNP  hydrochlorothiazide (HYDRODIURIL) 12.5 MG tablet Take 1 tablet (12.5 mg total) by mouth every morning. 04/14/19  Yes Hubbard Hartshorn, FNP  Insulin Detemir (LEVEMIR) 100 UNIT/ML Pen Inject 30 Units into the skin daily. 08/09/19  Yes Hubbard Hartshorn, FNP  Insulin Pen Needle (NOVOFINE) 32G X 6 MM MISC 1 each by Does not apply route once a week. 07/18/19  Yes Hubbard Hartshorn, FNP  lisinopril (ZESTRIL) 40 MG tablet TAKE 1 TABLET BY MOUTH DAILY 07/17/19  Yes Hubbard Hartshorn, FNP  pantoprazole (PROTONIX) 40 MG tablet TAKE 1 TABLET BY MOUTH DAILY BEFORE BREAKFAST 09/04/19  Yes Vanga, Tally Due, MD  Semaglutide,0.25 or 0.5MG/DOS, (OZEMPIC, 0.25 OR 0.5 MG/DOSE,) 2 MG/1.5ML SOPN Inject 0.25 mg into the skin once a week for 28 days, THEN 0.5 mg once a week. 07/18/19 11/13/19 Yes Hubbard Hartshorn, FNP  acetaminophen (TYLENOL) 325 MG tablet Take 2 tablets (650 mg total) by mouth every 4 (four) hours as needed for mild pain (or temp >  37.5 C (99.5 F)). Patient not taking: Reported on 06/20/2019 09/23/18   Angiulli, Lavon Paganini, PA-C  aspirin 81 MG EC tablet Take 1 tablet (81 mg total) by mouth daily. Patient not taking: Reported on 09/04/2019 02/03/17   Tawni Millers, MD    Allergies Patient has no known allergies.  Family History  Problem Relation Age of Onset  . Hypertension Mother   . Hypertension Father   . Diabetes Father     Social History Social History   Tobacco Use  . Smoking status: Former Smoker    Types: Cigarettes    Start date: 1982  . Smokeless tobacco: Never Used  Substance Use Topics  . Alcohol use: Yes    Alcohol/week: 1.0 standard drinks    Types: 1 Cans of beer per week  . Drug use: No    Review of Systems  Constitutional: No fever. Eyes: No redness. ENT: No sore throat. Cardiovascular: Positive for chest pain. Respiratory: Denies shortness of breath. Gastrointestinal: No vomiting or diarrhea.  Genitourinary: Negative for flank pain.  Musculoskeletal: Negative for back pain. Skin: Negative for rash. Neurological: Negative for headache.   ____________________________________________   PHYSICAL EXAM:  VITAL SIGNS: ED Triage Vitals  Enc Vitals Group     BP 09/04/19 2147 (!) 163/112     Pulse Rate 09/04/19 2147 72     Resp 09/04/19 2147 14     Temp 09/04/19 2150 97.6 F (36.4 C)     Temp Source 09/04/19 2150 Oral     SpO2 09/04/19 2147 100 %     Weight 09/04/19 2139 170 lb (77.1 kg)     Height 09/04/19 2139 _0  (1.676 m)     Head Circumference --      Peak Flow --      Pain Score 09/04/19 2139 10     Pain Loc --      Pain Edu? --      Excl. in [place]? --     Constitutional: Alert and oriented.  Somewhat chronically weak appearing but in no acute distress. Eyes: Conjunctivae are normal.  Head: Atraumatic. Nose: No congestion/rhinnorhea. Mouth/Throat: Mucous membranes are moist.   Neck: Normal range of motion.  Cardiovascular: Normal rate, regular rhythm. Grossly  normal heart sounds.  Good peripheral circulation.  Chest wall nontender. Respiratory: Normal respiratory effort.  No retractions. Lungs CTAB. Gastrointestinal: Soft and nontender. No distention.  Genitourinary: No flank tenderness. Musculoskeletal: No lower extremity edema.  No calf or popliteal swelling or tenderness.  Extremities warm and well perfused.  Neurologic:  Normal speech and language. No gross focal neurologic deficits are appreciated.  Skin:  Skin is warm and dry. No rash noted. Psychiatric: Mood and affect are normal. Speech and behavior are normal.  ____________________________________________   LABS (all labs ordered are listed, but only abnormal results are displayed)  Labs Reviewed  BASIC METABOLIC PANEL - Abnormal; Notable for the following components:      Result Value   Glucose, Bld 524 (*)  BUN 27 (*)    Creatinine, Ser 1.45 (*)    GFR calc non Af Amer 51 (*)    GFR calc Af Amer 59 (*)    Anion gap 16 (*)    All other components within normal limits  CBC - Abnormal; Notable for the following components:   RBC 4.17 (*)    Hemoglobin 11.9 (*)    HCT 36.1 (*)    nRBC 0.3 (*)    All other components within normal limits  SARS CORONAVIRUS 2 (TAT 6-24 HRS)  FIBRIN DERIVATIVES D-DIMER ([facility] ONLY)  TROPONIN I (HIGH SENSITIVITY)  TROPONIN I (HIGH SENSITIVITY)   ____________________________________________  EKG  ED ECG REPORT I, Arta Silence, the attending physician, personally viewed and interpreted this ECG.  Date: 09/04/2019 EKG Time: 2141 Rate: 76 Rhythm: normal sinus rhythm QRS Axis: normal Intervals: RBBB ST/T Wave abnormalities: Nonspecific T wave inversions anterolateral which are new when compared to EKG of 09/01/2018 Narrative Interpretation: Nonspecific abnormalities with no evidence of acute ischemia  ____________________________________________  RADIOLOGY  CXR: No focal infiltrate or other acute  abnormality  ____________________________________________   PROCEDURES  Procedure(s) performed: No  Procedures  Critical Care performed: No ____________________________________________   INITIAL IMPRESSION / ASSESSMENT AND PLAN / ED COURSE  Pertinent labs & imaging results that were available during my care of the patient were reviewed by me and considered in my medical decision making (see chart for details).  64 year old male with a past history of a CVA and diabetes but no known CAD presents with acute onset of left-sided chest pain over the course of the day today with no significant associated symptoms.  He denies any prior history of this pain.  On exam, the patient is somewhat chronically weak appearing but in no acute distress.  I cannot reproduce the pain with palpation or movement of the left arm on exam.  His vital signs are normal.  The remainder of the physical exam is unremarkable.  EKG shows RBBB with some new anterolateral T wave inversions when compared to an EKG from a year ago, but no specific ischemic findings.  Differential includes ACS, musculoskeletal chest pain, GERD, or less likely pulmonary embolism.  I have a low suspicion for PE given the patient's lack of tachycardia or hypoxia.  There is no clinical evidence suggestive of aortic dissection or other vascular etiology.  We will obtain basic labs, troponin x2, D-dimer, chest x-ray, and reassess.  ----------------------------------------- 10:54 PM on 09/04/2019 -----------------------------------------  Initial troponin is negative.  However, the patient is significantly hyperglycemic with an elevated anion gap, suggestive of DKA.  I have ordered an insulin infusion and fluids.  I discussed the case with hospitalist Dr. Flossie Buffy for admission.  ___________________________  Evan Cones Sr. was evaluated in Emergency Department on 09/04/2019 for the symptoms described in the history of present illness. He  was evaluated in the context of the global COVID-19 pandemic, which necessitated consideration that the patient might be at risk for infection with the SARS-CoV-2 virus that causes COVID-19. Institutional protocols and algorithms that pertain to the evaluation of patients at risk for COVID-19 are in a state of rapid change based on information released by regulatory bodies including the CDC and federal and state organizations. These policies and algorithms were followed during the patient's care in the ED.   ____________________________________________   FINAL CLINICAL IMPRESSION(S) / ED DIAGNOSES  Final diagnoses:  Chest pain      NEW MEDICATIONS STARTED DURING THIS VISIT:  New  Prescriptions   No medications on file     Note:  This document was prepared using Dragon voice recognition software and may include unintentional dictation errors.    Arta Silence, MD 09/04/19 2329

## 2019-09-04 NOTE — ED Notes (Signed)
Wife Meriam Sprague called, insulin was not given tonight but she sent it in the red bag that EMS brought with the patient.

## 2019-09-04 NOTE — H&P (Signed)
History and Physical    Evan Townsend RCB:[id number] DOB: 1956-03-03 DOA: 09/04/2019  PCP: Hubbard Hartshorn, FNP  Patient coming from: Home  I have personally briefly reviewed patient's old medical records in [place]  Chief Complaint: chest pain  HPI: Evan WONG Sr. is a 64 y.o. male with medical history significant of CVA with residual left-sided deficit, HTN, Insulin dependent type 2 diabetes, GERD who presents with concerns of chest pain. Chest pain started last night at 8:00 when he was sitting and watching TV.  Pain was left-sided and focal 5 out of 10 pain.  Also associated with nausea and diaphoresis.  No shortness of breath.  This pain has been constant and still persist in the ED.  States pain is now 10 out of 10. He denies any coughing or fevers.  No nausea, vomiting or diarrhea abdominal pain. Patient is a insulin-dependent type 2 diabetic and states that he did take his 30 units of insulin last night. He endorses smoking about half a pack of cigarettes per day has occasional alcohol and denies illicit drug use.  ED Course: He was afebrile, mildly hypertensive up to systolics of 803O and stable on room air. Labs notable for hemoglobin of 11.9, glucose of 524, creatinine of 1.45 from a prior of 1.19 and anion gap of 16. Troponin flat at 6 and 5. EKG showed RBBB.   CXR negative.   He was given 4 mg IV, 1 L of normal saline and was started on insulin drip  Review of Systems:  Constitutional: No Weight Change, No Fever ENT/Mouth: No sore throat, No Rhinorrhea Eyes: No Eye Pain, No Vision Changes Cardiovascular: + Chest Pain, + SOB Respiratory: No Cough,  Gastrointestinal: No Nausea, No Vomiting, No Diarrhea,No Pain Genitourinary: no Urinary Incontinence Musculoskeletal: No Arthralgias, No Myalgias Skin: No Skin Lesions, Neuro: no Weakness, No Numbness,   Psych: no decrease appetite Heme/Lymph: No Bruising, No Bleeding  Past Medical History:  Diagnosis  Date  . Acute CVA (cerebrovascular accident) ([place]) 11/10/2016  . Diabetes 1.5, managed as type 2 ([place])   . Diabetes mellitus without complication ([place])   . Hypercholesterolemia   . Hypertension   . Pain in both feet   . Stroke [facility])     Past Surgical History:  Procedure Laterality Date  . ESOPHAGOGASTRODUODENOSCOPY (EGD) WITH PROPOFOL N/A 04/26/2019   Procedure: ESOPHAGOGASTRODUODENOSCOPY (EGD) WITH PROPOFOL;  Surgeon: Lin Landsman, MD;  Location: [place];  Service: Gastroenterology;  Laterality: N/A;     reports that he has quit smoking. His smoking use included cigarettes. He started smoking about 39 years ago. He has never used smokeless tobacco. He reports current alcohol use of about 1.0 standard drinks of alcohol per week. He reports that he does not use drugs.  No Known Allergies  Family History  Problem Relation Age of Onset  . Hypertension Mother   . Hypertension Father   . Diabetes Father      Prior to Admission medications   Medication Sig Start Date End Date Taking? Authorizing Provider  amLODipine (NORVASC) 10 MG tablet Take 1 tablet (10 mg total) by mouth daily. 04/14/19 04/13/20 Yes Hubbard Hartshorn, FNP  atorvastatin (LIPITOR) 80 MG tablet Take 1 tablet (80 mg total) by mouth every evening. 09/23/18  Yes Angiulli, Lavon Paganini, PA-C  baclofen (LIORESAL) 10 MG tablet TAKE 1 TABLET BY MOUTH 3 TIMES DAILY 08/15/19  Yes Hubbard Hartshorn, FNP  Blood Glucose Monitoring Suppl (FIFTY50 GLUCOSE METER  2.0) w/Device KIT Use as instructed 05/05/18  Yes [provider]  clopidogrel (PLAVIX) 75 MG tablet Take 1 tablet (75 mg total) by mouth daily. 09/23/18  Yes Angiulli, Lavon Paganini, PA-C  gabapentin (NEURONTIN) 300 MG capsule Take 2 capsules (600 mg total) by mouth 2 (two) times daily. 08/07/19  Yes Hubbard Hartshorn, FNP  glipiZIDE (GLUCOTROL) 5 MG tablet TAKE 1 TABLET BY MOUTH DAILY BEFORE BREAKFAST 07/18/19  Yes Hubbard Hartshorn, FNP  hydrochlorothiazide (HYDRODIURIL) 12.5 MG  tablet Take 1 tablet (12.5 mg total) by mouth every morning. 04/14/19  Yes Hubbard Hartshorn, FNP  Insulin Detemir (LEVEMIR) 100 UNIT/ML Pen Inject 30 Units into the skin daily. 08/09/19  Yes Hubbard Hartshorn, FNP  Insulin Pen Needle (NOVOFINE) 32G X 6 MM MISC 1 each by Does not apply route once a week. 07/18/19  Yes Hubbard Hartshorn, FNP  lisinopril (ZESTRIL) 40 MG tablet TAKE 1 TABLET BY MOUTH DAILY 07/17/19  Yes Hubbard Hartshorn, FNP  pantoprazole (PROTONIX) 40 MG tablet TAKE 1 TABLET BY MOUTH DAILY BEFORE BREAKFAST 09/04/19  Yes Vanga, Tally Due, MD  Semaglutide,0.25 or 0.5MG/DOS, (OZEMPIC, 0.25 OR 0.5 MG/DOSE,) 2 MG/1.5ML SOPN Inject 0.25 mg into the skin once a week for 28 days, THEN 0.5 mg once a week. 07/18/19 11/13/19 Yes Hubbard Hartshorn, FNP  acetaminophen (TYLENOL) 325 MG tablet Take 2 tablets (650 mg total) by mouth every 4 (four) hours as needed for mild pain (or temp > 37.5 C (99.5 F)). Patient not taking: Reported on 06/20/2019 09/23/18   Angiulli, Lavon Paganini, PA-C  aspirin 81 MG EC tablet Take 1 tablet (81 mg total) by mouth daily. Patient not taking: Reported on 09/04/2019 02/03/17   Tawni Millers, MD    Physical Exam: Vitals:   09/04/19 2139 09/04/19 2147 09/04/19 2150 09/04/19 2248  BP:  (!) 163/112  (!) 145/82  Pulse:  72  69  Resp:  14  17  Temp:   97.6 F (36.4 C)   TempSrc:   Oral   SpO2:  100%  100%  Weight: 77.1 kg     Height: 5' 6"  (1.676 m)       Constitutional: NAD, calm, comfortable, thin elderly male laying in bed asleep.  Awoke easily to voice. Vitals:   09/04/19 2139 09/04/19 2147 09/04/19 2150 09/04/19 2248  BP:  (!) 163/112  (!) 145/82  Pulse:  72  69  Resp:  14  17  Temp:   97.6 F (36.4 C)   TempSrc:   Oral   SpO2:  100%  100%  Weight: 77.1 kg     Height: 5' 6"  (1.676 m)      Eyes: PERRL, lids and conjunctivae normal ENMT: Mucous membranes are moist.  Neck: normal, supple Respiratory: clear to auscultation bilaterally, no wheezing, no crackles. Normal  respiratory effort on room air.  Cardiovascular: Regular rate and rhythm, no murmurs / rubs / gallops. No extremity edema.   Abdomen: no tenderness, no masses palpated.  Bowel sounds positive.  Musculoskeletal: no clubbing / cyanosis. No joint deformity upper and lower extremities. Good ROM, no contractures. Normal muscle tone.  Skin: no rashes, lesions, ulcers. No induration Neurologic: CN 2-12 grossly intact. Mild slurring and slow speech.  Sensation intact. Strength 5/5 in right upper and lower extremity.  4 out of 5 of the left upper and lower extremity. Psychiatric: Normal judgment and insight. Alert and oriented x 3. Normal mood.    Labs on Admission: I  have personally reviewed following labs and imaging studies  CBC: Recent Labs  Lab 09/04/19 2154  WBC 5.7  HGB 11.9*  HCT 36.1*  MCV 86.6  PLT 010   Basic Metabolic Panel: Recent Labs  Lab 09/04/19 2154  NA 137  K 3.9  CL 98  CO2 23  GLUCOSE 524*  BUN 27*  CREATININE 1.45*  CALCIUM 10.2   GFR: Estimated Creatinine Clearance: 51.6 mL/min (A) (by C-G formula based on SCr of 1.45 mg/dL (H)). Liver Function Tests: No results for input(s): AST, ALT, ALKPHOS, BILITOT, PROT, ALBUMIN in the last 168 hours. No results for input(s): LIPASE, AMYLASE in the last 168 hours. No results for input(s): AMMONIA in the last 168 hours. Coagulation Profile: No results for input(s): INR, PROTIME in the last 168 hours. Cardiac Enzymes: No results for input(s): CKTOTAL, CKMB, CKMBINDEX, TROPONINI in the last 168 hours. BNP (last 3 results) No results for input(s): PROBNP in the last 8760 hours. HbA1C: No results for input(s): HGBA1C in the last 72 hours. CBG: No results for input(s): GLUCAP in the last 168 hours. Lipid Profile: No results for input(s): CHOL, HDL, LDLCALC, TRIG, CHOLHDL, LDLDIRECT in the last 72 hours. Thyroid Function Tests: No results for input(s): TSH, T4TOTAL, FREET4, T3FREE, THYROIDAB in the last 72  hours. Anemia Panel: No results for input(s): VITAMINB12, FOLATE, FERRITIN, TIBC, IRON, RETICCTPCT in the last 72 hours. Urine analysis:    Component Value Date/Time   COLORURINE YELLOW (A) 09/01/2018 1752   APPEARANCEUR CLEAR (A) 09/01/2018 1752   LABSPEC 1.018 09/01/2018 1752   PHURINE 6.0 09/01/2018 1752   GLUCOSEU 150 (A) 09/01/2018 1752   HGBUR NEGATIVE 09/01/2018 1752   BILIRUBINUR NEGATIVE 09/01/2018 [place] 09/01/2018 1752   PROTEINUR 100 (A) 09/01/2018 1752   NITRITE NEGATIVE 09/01/2018 1752   LEUKOCYTESUR NEGATIVE 09/01/2018 1752    Radiological Exams on Admission: DG Chest Port 1 View  Result Date: 09/04/2019 CLINICAL DATA:  Chest pain EXAM: PORTABLE CHEST 1 VIEW COMPARISON:  None. FINDINGS: The heart size and mediastinal contours are within normal limits. Both lungs are clear. The visualized skeletal structures are unremarkable. IMPRESSION: No active disease. Electronically Signed   By: Donavan Foil M.D.   On: 09/04/2019 22:12    EKG: Independently reviewed.   Assessment/Plan  Atypical chest pain Chest pain has been constant for over 24 hours Troponin flat at 6 and 5. EKG shows RBBB.  D-dimer pending Patient does have risk factors with uncontrolled type 2 diabetes, hypertension and ongoing tobacco use Consult cardiology in the morning for possible Lexiscan.  Will keep n.p.o. over midnight.  Hyperglycemia BG of 524 with anion gap of 16 on admission Normally on 20 units of Levermir at home replete potassium continue insulin gtt with goal of 140-180 and AG <12 IV NS until BG <250, then switch to D5 1/2 NS  BMP q4hr  keep NPO  Anemia No signs of active bleed Check iron panel, vitamin B12, folate  Acute kidney injury  Creatinine of 1.45 Monitor after fluids Avoid nephrotoxic agents   Hypertension  Hold Lisinopril and HCTZ due to AKI Continue amlodipine  GERD  continue PPI   History of CVA with residual left sided deficit   Continue aspirin, Plavix  Chronic left sided neck and shoulder pain Continue Baclofen, gabapentin   DVT prophylaxis:.Lovenox Code Status: Full Family Communication: Plan discussed with patient at bedside  disposition Plan: Home with observation Consults called:  Admission status: Observation  Evan Desanctis DO [facility]   If 7PM-7AM, please contact night-coverage [email or web address]   09/04/2019, 11:25 PM

## 2019-09-04 NOTE — Patient Instructions (Signed)
Access Code: [email or web address]  URL: [email or web address]  Date: 09/04/2019  Prepared by: Precious Bard   Exercises Seated Long Arc Quad - 10 reps - 2 sets - 5 hold - 1x daily - 7x weekly Seated Heel Toe Raises - 10 reps - 2 sets - 5 hold - 1x daily - 7x weekly Seated March - 10 reps - 2 sets - 5 hold - 1x daily - 7x weekly Seated Ankle Alphabet - 10 reps - 2 sets - 5 hold - 1x daily - 7x weekly

## 2019-09-04 NOTE — ED Notes (Addendum)
Date and time results received: 09/04/19 2245   Test: Blood Glucose Critical Value: 524  Name of Provider Notified: Siadecki MD

## 2019-09-04 NOTE — Therapy (Signed)
[facility] MAIN [facility] SERVICES [address] [place], [place], [address] Phone: [phone]   Fax:  [phone]  Physical Therapy Evaluation  Patient Details  Name: Evan CHRISTOPHERSON Sr. MRN: [id number] Date of Birth: 1955-12-30 Referring Provider (PT): Maurice Small   Encounter Date: 09/04/2019  PT End of Session - 09/04/19 2009    Visit Number  1    Number of Visits  8    Date for PT Re-Evaluation  10/30/19    PT Start Time  0904    PT Stop Time  0955    PT Time Calculation (min)  51 min    Equipment Utilized During Treatment  Gait belt    Activity Tolerance  Patient tolerated treatment well    Behavior During Therapy  [facility] for tasks assessed/performed       Past Medical History:  Diagnosis Date  . Acute CVA (cerebrovascular accident) (HCC) 11/10/2016  . Diabetes 1.5, managed as type 2 (HCC)   . Diabetes mellitus without complication (HCC)   . Hypercholesterolemia   . Hypertension   . Pain in both feet   . Stroke [facility])     Past Surgical History:  Procedure Laterality Date  . ESOPHAGOGASTRODUODENOSCOPY (EGD) WITH PROPOFOL N/A 04/26/2019   Procedure: ESOPHAGOGASTRODUODENOSCOPY (EGD) WITH PROPOFOL;  Surgeon: Toney Reil, MD;  Location: [facility] ENDOSCOPY;  Service: Gastroenterology;  Laterality: N/A;    There were no vitals filed for this visit.   Subjective Assessment - 09/04/19 2006    Subjective  Patient is a pleasant 64 year old male who presents for L hemiplegia, left leg/side, neck pain.    Patient is accompained by:  Family member    Pertinent History  Patient is a pleasant 64 year old male who presents for L hemiplegia, left leg/side, neck pain. Went to inpatient rehab in [place] of 2020, went to outpatient rehab in September and august of 2020. Reports no falls but a few almost falls. Reports uses a walker/canes, prefers a cane.  Reports getting dizzy when he gets up.  PMH includes HTN, TIA, RBBB, sinus bradycardia, dysphagia,  DM, hemiplegia of L side, HLD, neuropathic pain. Wears a L AFO. Aide comes 2-3 hours a day 7 days a week.    Limitations  Lifting;Standing;Walking;House hold activities    How long can you sit comfortably?  n/a    How long can you stand comfortably?  unsteady without UE support    How long can you walk comfortably?  can walk across room with AD    Patient Stated Goals  to get stronger and walk better    Currently in Pain?  Yes    Pain Score  --   patient does not give number despite request from PT   Pain Location  --   L flank, legs, arm   Pain Orientation  Left    Pain Descriptors / Indicators  Aching    Pain Type  Neuropathic pain    Pain Onset  More than a month ago    Pain Frequency  Constant    Aggravating Factors   constant    Pain Relieving Factors  constant         [facility] PT Assessment - 09/04/19 0001      Assessment   Medical Diagnosis  L hemiparesis    Referring Provider (PT)  Maurice Small    Onset Date/Surgical Date  --   early 2020   Hand Dominance  Right  Precautions   Precautions  Fall    Required Braces or Orthoses  --   L AFO     Home Environment   Living Environment  Private residence    Living Arrangements  Spouse/significant other    Available Help at Discharge  Family    Type of Home  House    Home Access  Stairs to enter    Entrance Stairs-Number of Steps  4      Prior Function   Leisure  fishing, grilling      Standardized Balance Assessment   Standardized Balance Assessment  Berg Balance Test      Berg Balance Test   Sit to Stand  Able to stand  independently using hands    Standing Unsupported  Able to stand safely 2 minutes    Sitting with Back Unsupported but Feet Supported on Floor or Stool  Able to sit safely and securely 2 minutes    Stand to Sit  Controls descent by using hands    Transfers  Able to transfer safely, definite need of hands    Standing Unsupported with Eyes Closed  Able to stand 10 seconds with supervision     Standing Unsupported with Feet Together  Able to place feet together independently and stand for 1 minute with supervision    From Standing, Reach Forward with Outstretched Arm  Can reach forward >5 cm safely (2")    From Standing Position, Pick up Object from Floor  Able to pick up shoe, needs supervision    From Standing Position, Turn to Look Behind Over each Shoulder  Looks behind from both sides and weight shifts well    Turn 360 Degrees  Needs assistance while turning    Standing Unsupported, Alternately Place Feet on Step/Stool  Able to complete >2 steps/needs minimal assist    Standing Unsupported, One Foot in Front  Needs help to step but can hold 15 seconds    Standing on One Leg  Tries to lift leg/unable to hold 3 seconds but remains standing independently    Total Score  35            PAIN: L ribs: current pain reports not that bad.  L leg pain: all the time, sometimes causes him to have near falls L arm pain. Sometimes wakes him up.    POSTURE: L arm flexion synergy pattern with limited spatial awareness of LLE. Marland Kitchen Preference for R sided gaze and trunk rotation    STRENGTH:  Graded on a 0-5 scale Muscle Group Left Right  Hip Flex 3 4+  Hip Abd 3 4  Hip Add 3 4  Hip Ext    Hip IR/ER    Knee Flex 3 4+  Knee Ext 2+ 4  Ankle DF 2+ 4  Ankle PF 3 4    L weakness/tone,    SENSATION: Limited LLE, has light sensation but is impaired.   SPECIAL TESTS: Coordination: Limited by L arm flexion synergy  Coordination: heel toe slide: limited spatial awareness and  Visual field: slight limitation in upper L quadrant   FUNCTIONAL MOBILITY:  STS: RUE support, R foot back, primary use of RLE and RUE for transition   BALANCE: Dynamic Sitting Balance  Normal Able to sit unsupported and weight shift across midline maximally   Good Able to sit unsupported and weight shift across midline moderately   Good-/Fair+ Able to sit unsupported and weight shift across midline  minimally   Fair Minimal weight shifting  ipsilateral/front, difficulty crossing midline   Fair- Reach to ipsilateral side and unable to weight shift x  Poor + Able to sit unsupported with min A and reach to ipsilateral side, unable to weight shift   Poor Able to sit unsupported with mod A and reach ipsilateral/front-can't cross midline     Standing Dynamic Balance  Normal Stand independently unsupported, able to weight shift and cross midline maximally   Good Stand independently unsupported, able to weight shift and cross midline moderately   Good-/Fair+ Stand independently unsupported, able to weight shift across midline minimally   Fair Stand independently unsupported, weight shift, and reach ipsilaterally, loss of balance when crossing midline   Poor+ Able to stand with Min A and reach ipsilaterally, unable to weight shift x  Poor Able to stand with Mod A and minimally reach ipsilaterally, unable to cross midline.     Static Sitting Balance  Normal Able to maintain balance against maximal resistance   Good Able to maintain balance against moderate resistance   Good-/Fair+ Accepts minimal resistance x  Fair Able to sit unsupported without balance loss and without UE support   Poor+ Able to maintain with Minimal assistance from individual or chair   Poor Unable to maintain balance-requires mod/max support from individual or chair     Static Standing Balance  Normal Able to maintain standing balance against maximal resistance   Good Able to maintain standing balance against moderate resistance   Good-/Fair+ Able to maintain standing balance against minimal resistance   Fair Able to stand unsupported without UE support and without LOB for 1-2 min x  Fair- Requires Min A and UE support to maintain standing without loss of balance   Poor+ Requires mod A and UE support to maintain standing without loss of balance   Poor Requires max A and UE support to maintain standing balance without  loss       GAIT: Patient utilizes Hurrycane with the following gait deficits: L knee hyperextension, L arm flexion pattern synergy, limited ankle reactions of L (limited df/pf), and ataxic gait pattern with limited weight shift onto LLE.   OUTCOME MEASURES: TEST Outcome Interpretation  5 times sit<>stand 19 sec RUE support >72 yo, >15 sec indicates increased risk for falls  10 meter walk test           31seconds with hurrycane=0.32      m/s <1.0 m/s indicates increased risk for falls; limited community ambulator  FOTO 22/100 Expected d/c at [employer] Assessment 35/56 <36/56 (100% risk for falls), 37-45 (80% risk for falls); 46-51 (>50% risk for falls); 52-55 (lower risk <25% of falls)         Objective measurements completed on examination: See above findings.      Access Code: [email or web address]  URL: [email or web address]  Date: 09/04/2019  Prepared by: Precious Bard   Exercises Seated Long Arc Quad - 10 reps - 2 sets - 5 hold - 1x daily - 7x weekly Seated Heel Toe Raises - 10 reps - 2 sets - 5 hold - 1x daily - 7x weekly Seated March - 10 reps - 2 sets - 5 hold - 1x daily - 7x weekly Seated Ankle Alphabet - 10 reps - 2 sets - 5 hold - 1x daily - 7x weekly       PT Education - 09/04/19 2009    Education Details  goals, POC, HEP    Person(s) Educated  Patient;Spouse    Methods  Explanation;Demonstration;Tactile cues;Verbal cues;Handout    Comprehension  Verbalized understanding;Returned demonstration;Verbal cues required;Tactile cues required       PT Short Term Goals - 09/04/19 2018      PT SHORT TERM GOAL #1   Title  Patient will be independent with HEP for improved therapeutic gains and self-management.    Baseline  1/18: HEP given    Time  4    Period  Weeks    Status  New    Target Date  10/02/19        PT Long Term Goals - 09/04/19 2018      PT LONG TERM GOAL #1   Title  Patient will increase Berg Balance score by > 6 points  (41/56) to demonstrate decreased fall risk during functional activities.    Baseline  1/18: 35/56    Time  8    Period  Weeks    Status  New    Target Date  10/30/19      PT LONG TERM GOAL #2   Title  Patient will increase 10 meter walk test to >1.7848m/s as to improve gait speed for better community ambulation and to reduce fall risk.    Baseline  1/18: 0.32 m/s with hurrycane    Time  8    Period  Weeks    Status  New    Target Date  10/30/19      PT LONG TERM GOAL #3   Title  Patient (> 63 years old) will complete five times sit to stand test in < 15 seconds indicating an increased LE strength and improved balance.    Baseline  1/18: 19 seconds with single UE support    Time  8    Period  Weeks    Status  New    Target Date  10/30/19      PT LONG TERM GOAL #4   Title  Patient will increase BLE gross strength to 4+/5 as to improve functional strength for independent gait, increased standing tolerance and increased ADL ability.    Baseline  1/18: L 3/5 R 4/5    Time  8    Period  Weeks    Status  New    Target Date  10/30/19             Plan - 09/04/19 2014    Clinical Impression Statement  Patient is a pleasant 64 year old male who presents with L sided impairment s/p CVA. Patient demonstrates flexor synergy patterning with exertion resulting in altered COM and instability in standing and dynamic motion. Patient is additionally limited with LLE coordination and spatial awareness resulting in altered gait pattern. Patient will benefit from skilled physical therapy to address LLE coordination, strength, and stability to optimize independence, increase mobility, and quality of life.    Personal Factors and Comorbidities  Age;Education;Sex;Social Background;Behavior Pattern;Finances;Past/Current Experience;Time since onset of injury/illness/exacerbation;Comorbidity 3+    Comorbidities  HTN, TIA, RBBB, sinus bradycardia, dysphagia, DM, hemiplegia of L side, HLD, neuropathic pain.     Examination-Activity Limitations  Bathing;Bed Mobility;Dressing;Transfers;Squat;Lift;Locomotion Level;Stairs;Reach Overhead;Carry;Stand    Examination-Participation Restrictions  Interpersonal Relationship;Yard Work;Laundry;Cleaning;Community Activity;Meal Prep;Medication Management    Stability/Clinical Decision Making  Evolving/Moderate complexity    Clinical Decision Making  Moderate    Rehab Potential  Good    PT Frequency  1x / week    PT Duration  8 weeks    PT Treatment/Interventions  ADLs/Self Care Home Management;Aquatic Therapy;Moist Heat;Cryotherapy;Gait training;Stair training;Functional mobility training;Therapeutic activities;Therapeutic exercise;Patient/family  education;Neuromuscular re-education;Balance training;Orthotic Fit/Training;Manual techniques;Passive range of motion;Dry needling;Taping;Energy conservation;Splinting;Joint Manipulations;Spinal Manipulations;Electrical Stimulation;Ultrasound;Vestibular    PT Next Visit Plan  SLS LLE progression, HEP performance, // bars    PT Home Exercise Plan  see above    Consulted and Agree with Plan of Care  Patient       Patient will benefit from skilled therapeutic intervention in order to improve the following deficits and impairments:  Abnormal gait, Decreased balance, Decreased endurance, Decreased mobility, Difficulty walking, Hypomobility, Increased muscle spasms, Impaired tone, Decreased range of motion, Decreased coordination, Decreased strength, Impaired UE functional use, Pain, Decreased activity tolerance, Decreased safety awareness, Impaired perceived functional ability, Impaired sensation, Improper body mechanics, Postural dysfunction  Visit Diagnosis: Unsteadiness on feet  Other abnormalities of gait and mobility  Muscle weakness (generalized)     Problem List Patient Active Problem List   Diagnosis Date Noted  . Coagulation disorder ([place]) 06/12/2019  . Gastroesophageal reflux disease   . Hemiplegia and  hemiparesis following cerebral infarction affecting left non-dominant side ([place]) 11/25/2018  . 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 RBBB 10/15/2018  . Sinus bradycardia 10/15/2018  . Dysphagia, post-stroke   . Neuropathic pain   . Lacunar infarct, acute ([place]) 09/06/2018  . TIA (transient ischemic attack) 09/01/2018  . Microalbuminuria 07/29/2018  . Left leg pain 01/13/2018  . Hyperlipidemia 02/11/2017  . Essential hypertension 11/10/2016  . Diabetes ([place]) 11/10/2016  . Tobacco abuse 08/28/2013    Janna Arch, PT, DPT   09/04/2019, 8:23 PM  [place] MAIN [facility] SERVICES [address] [place], [place], [address] Phone: [phone]   Fax:  [phone]  Name: VIRAT PRATHER Sr. MRN: [id number] Date of Birth: [birth year]

## 2019-09-04 NOTE — ED Triage Notes (Signed)
64 yo male from home, lives with family, reports left sided chest pain started this morning 5/10 and became progresively worse, currently 10/10.  He describes it as pressure.  No cardiac hx.  Stroke last year, left arm and leg immobile, some droop, and some slurred speech.    EMS gave 2 shots of nitro, did not improve pain.  No aspirin given, pt did not have dentures.  HR in the 70s, BP 150/90, 100 ml of NS, 20 in right AC.  Pt is diabetic and has not had insulin tonight.

## 2019-09-05 ENCOUNTER — Encounter: Payer: Self-pay | Admitting: Family Medicine

## 2019-09-05 ENCOUNTER — Inpatient Hospital Stay (HOSPITAL_COMMUNITY)
Admit: 2019-09-05 | Discharge: 2019-09-05 | Disposition: A | Discharge status: Home / Self Care | Payer: Medicaid Other | Attending: Nurse Practitioner | Admitting: Nurse Practitioner

## 2019-09-05 ENCOUNTER — Other Ambulatory Visit: Payer: Self-pay

## 2019-09-05 ENCOUNTER — Encounter: Payer: Self-pay | Admitting: Speech Pathology

## 2019-09-05 DIAGNOSIS — E1110 Type 2 diabetes mellitus with ketoacidosis without coma: Secondary | ICD-10-CM | POA: Diagnosis present

## 2019-09-05 DIAGNOSIS — Z7902 Long term (current) use of antithrombotics/antiplatelets: Secondary | ICD-10-CM | POA: Diagnosis not present

## 2019-09-05 DIAGNOSIS — Z716 Tobacco abuse counseling: Secondary | ICD-10-CM

## 2019-09-05 DIAGNOSIS — I340 Nonrheumatic mitral (valve) insufficiency: Secondary | ICD-10-CM

## 2019-09-05 DIAGNOSIS — E1151 Type 2 diabetes mellitus with diabetic peripheral angiopathy without gangrene: Secondary | ICD-10-CM | POA: Diagnosis present

## 2019-09-05 DIAGNOSIS — D638 Anemia in other chronic diseases classified elsewhere: Secondary | ICD-10-CM

## 2019-09-05 DIAGNOSIS — Z79899 Other long term (current) drug therapy: Secondary | ICD-10-CM | POA: Diagnosis not present

## 2019-09-05 DIAGNOSIS — R32 Unspecified urinary incontinence: Secondary | ICD-10-CM | POA: Diagnosis present

## 2019-09-05 DIAGNOSIS — E785 Hyperlipidemia, unspecified: Secondary | ICD-10-CM | POA: Diagnosis present

## 2019-09-05 DIAGNOSIS — D649 Anemia, unspecified: Secondary | ICD-10-CM

## 2019-09-05 DIAGNOSIS — I6523 Occlusion and stenosis of bilateral carotid arteries: Secondary | ICD-10-CM | POA: Diagnosis present

## 2019-09-05 DIAGNOSIS — R079 Chest pain, unspecified: Secondary | ICD-10-CM | POA: Diagnosis not present

## 2019-09-05 DIAGNOSIS — Z8249 Family history of ischemic heart disease and other diseases of the circulatory system: Secondary | ICD-10-CM | POA: Diagnosis not present

## 2019-09-05 DIAGNOSIS — N179 Acute kidney failure, unspecified: Secondary | ICD-10-CM

## 2019-09-05 DIAGNOSIS — G8929 Other chronic pain: Secondary | ICD-10-CM | POA: Diagnosis present

## 2019-09-05 DIAGNOSIS — E7800 Pure hypercholesterolemia, unspecified: Secondary | ICD-10-CM | POA: Diagnosis not present

## 2019-09-05 DIAGNOSIS — Z8673 Personal history of transient ischemic attack (TIA), and cerebral infarction without residual deficits: Secondary | ICD-10-CM

## 2019-09-05 DIAGNOSIS — F17210 Nicotine dependence, cigarettes, uncomplicated: Secondary | ICD-10-CM | POA: Diagnosis present

## 2019-09-05 DIAGNOSIS — I10 Essential (primary) hypertension: Secondary | ICD-10-CM | POA: Diagnosis not present

## 2019-09-05 DIAGNOSIS — I69354 Hemiplegia and hemiparesis following cerebral infarction affecting left non-dominant side: Secondary | ICD-10-CM | POA: Diagnosis not present

## 2019-09-05 DIAGNOSIS — E1159 Type 2 diabetes mellitus with other circulatory complications: Secondary | ICD-10-CM | POA: Diagnosis not present

## 2019-09-05 DIAGNOSIS — Z833 Family history of diabetes mellitus: Secondary | ICD-10-CM | POA: Diagnosis not present

## 2019-09-05 DIAGNOSIS — E1142 Type 2 diabetes mellitus with diabetic polyneuropathy: Secondary | ICD-10-CM | POA: Diagnosis present

## 2019-09-05 DIAGNOSIS — E876 Hypokalemia: Secondary | ICD-10-CM | POA: Diagnosis present

## 2019-09-05 DIAGNOSIS — J189 Pneumonia, unspecified organism: Secondary | ICD-10-CM | POA: Diagnosis present

## 2019-09-05 DIAGNOSIS — R739 Hyperglycemia, unspecified: Secondary | ICD-10-CM

## 2019-09-05 DIAGNOSIS — E1010 Type 1 diabetes mellitus with ketoacidosis without coma: Secondary | ICD-10-CM | POA: Diagnosis not present

## 2019-09-05 DIAGNOSIS — I4510 Unspecified right bundle-branch block: Secondary | ICD-10-CM | POA: Diagnosis present

## 2019-09-05 DIAGNOSIS — R0789 Other chest pain: Secondary | ICD-10-CM | POA: Diagnosis not present

## 2019-09-05 DIAGNOSIS — M25512 Pain in left shoulder: Secondary | ICD-10-CM | POA: Diagnosis present

## 2019-09-05 DIAGNOSIS — Z20822 Contact with and (suspected) exposure to covid-19: Secondary | ICD-10-CM | POA: Diagnosis not present

## 2019-09-05 DIAGNOSIS — E1165 Type 2 diabetes mellitus with hyperglycemia: Secondary | ICD-10-CM

## 2019-09-05 DIAGNOSIS — M542 Cervicalgia: Secondary | ICD-10-CM | POA: Diagnosis present

## 2019-09-05 DIAGNOSIS — K219 Gastro-esophageal reflux disease without esophagitis: Secondary | ICD-10-CM | POA: Diagnosis not present

## 2019-09-05 DIAGNOSIS — Z794 Long term (current) use of insulin: Secondary | ICD-10-CM | POA: Diagnosis not present

## 2019-09-05 LAB — IRON AND TIBC
Iron: 85 ug/dL (ref 45–182)
Saturation Ratios: 23 % (ref 17.9–39.5)
TIBC: 363 ug/dL (ref 250–450)
UIBC: 278 ug/dL

## 2019-09-05 LAB — BASIC METABOLIC PANEL
Anion gap: 13 (ref 5–15)
Anion gap: 13 (ref 5–15)
Anion gap: 11 (ref 5–15)
BUN: 26 mg/dL — ABNORMAL HIGH (ref 8–23)
BUN: 24 mg/dL — ABNORMAL HIGH (ref 8–23)
BUN: 23 mg/dL (ref 8–23)
CO2: 23 mmol/L (ref 22–32)
CO2: 25 mmol/L (ref 22–32)
CO2: 23 mmol/L (ref 22–32)
Calcium: 9.9 mg/dL (ref 8.9–10.3)
Calcium: 9.9 mg/dL (ref 8.9–10.3)
Calcium: 9.7 mg/dL (ref 8.9–10.3)
Chloride: 109 mmol/L (ref 98–111)
Chloride: 108 mmol/L (ref 98–111)
Chloride: 107 mmol/L (ref 98–111)
Creatinine, Ser: 1.36 mg/dL — ABNORMAL HIGH (ref 0.61–1.24)
Creatinine, Ser: 1.24 mg/dL (ref 0.61–1.24)
Creatinine, Ser: 1.07 mg/dL (ref 0.61–1.24)
GFR calc Af Amer: >60 mL/min (ref >60)
GFR calc Af Amer: >60 mL/min (ref >60)
GFR calc Af Amer: >60 mL/min (ref >60)
GFR calc non Af Amer: 55 mL/min — ABNORMAL LOW (ref >60)
GFR calc non Af Amer: >60 mL/min (ref >60)
GFR calc non Af Amer: >60 mL/min (ref >60)
Glucose, Bld: 257 mg/dL — ABNORMAL HIGH (ref 70–99)
Glucose, Bld: 147 mg/dL — ABNORMAL HIGH (ref 70–99)
Glucose, Bld: 225 mg/dL — ABNORMAL HIGH (ref 70–99)
Potassium: 2.7 mmol/L — CL (ref 3.5–5.1)
Potassium: 2.9 mmol/L — ABNORMAL LOW (ref 3.5–5.1)
Potassium: 4.1 mmol/L (ref 3.5–5.1)
Sodium: 145 mmol/L (ref 135–145)
Sodium: 146 mmol/L — ABNORMAL HIGH (ref 135–145)
Sodium: 141 mmol/L (ref 135–145)

## 2019-09-05 LAB — GLUCOSE, CAPILLARY
Glucose-Capillary: 120 mg/dL — ABNORMAL HIGH (ref 70–99)
Glucose-Capillary: 169 mg/dL — ABNORMAL HIGH (ref 70–99)
Glucose-Capillary: 174 mg/dL — ABNORMAL HIGH (ref 70–99)
Glucose-Capillary: 190 mg/dL — ABNORMAL HIGH (ref 70–99)
Glucose-Capillary: 212 mg/dL — ABNORMAL HIGH (ref 70–99)
Glucose-Capillary: 216 mg/dL — ABNORMAL HIGH (ref 70–99)
Glucose-Capillary: 249 mg/dL — ABNORMAL HIGH (ref 70–99)
Glucose-Capillary: 291 mg/dL — ABNORMAL HIGH (ref 70–99)
Glucose-Capillary: 366 mg/dL — ABNORMAL HIGH (ref 70–99)
Glucose-Capillary: 392 mg/dL — ABNORMAL HIGH (ref 70–99)
Glucose-Capillary: 412 mg/dL — ABNORMAL HIGH (ref 70–99)
Glucose-Capillary: 433 mg/dL — ABNORMAL HIGH (ref 70–99)
Glucose-Capillary: 441 mg/dL — ABNORMAL HIGH (ref 70–99)
Glucose-Capillary: 445 mg/dL — ABNORMAL HIGH (ref 70–99)

## 2019-09-05 LAB — URINE DRUG SCREEN, QUALITATIVE (ARMC ONLY)
Amphetamines, Ur Screen: NOT DETECTED
Barbiturates, Ur Screen: NOT DETECTED
Benzodiazepine, Ur Scrn: NOT DETECTED
Cannabinoid 50 Ng, Ur ~~LOC~~: NOT DETECTED
Cocaine Metabolite,Ur ~~LOC~~: NOT DETECTED
MDMA (Ecstasy)Ur Screen: NOT DETECTED
Methadone Scn, Ur: NOT DETECTED
Opiate, Ur Screen: NOT DETECTED
Phencyclidine (PCP) Ur S: NOT DETECTED
Tricyclic, Ur Screen: NOT DETECTED

## 2019-09-05 LAB — HIV ANTIBODY (ROUTINE TESTING W REFLEX): HIV Screen 4th Generation wRfx: NONREACTIVE

## 2019-09-05 LAB — TROPONIN I (HIGH SENSITIVITY)
Troponin I (High Sensitivity): 5 ng/L (ref <18)
Troponin I (High Sensitivity): 7 ng/L (ref <18)

## 2019-09-05 LAB — VITAMIN B12: Vitamin B-12: 569 pg/mL (ref 180–914)

## 2019-09-05 LAB — SARS CORONAVIRUS 2 (TAT 6-24 HRS): SARS Coronavirus 2: NEGATIVE

## 2019-09-05 LAB — MRSA PCR SCREENING: MRSA by PCR: NEGATIVE

## 2019-09-05 LAB — FIBRIN DERIVATIVES D-DIMER (ARMC ONLY): Fibrin derivatives D-dimer (ARMC): 539.3 ng/mL (FEU) — ABNORMAL HIGH (ref 0.00–499.00)

## 2019-09-05 LAB — FOLATE: Folate: 11.8 ng/mL (ref >5.9)

## 2019-09-05 MED ORDER — PANTOPRAZOLE SODIUM 40 MG PO TBEC
40.0000 mg | DELAYED_RELEASE_TABLET | Freq: Every day | ORAL | Status: DC
  Administered 2019-09-05 – 2019-09-06 (×2): 40 mg via ORAL
  Filled 2019-09-05 (×2): qty 1

## 2019-09-05 MED ORDER — LIDOCAINE VISCOUS HCL 2 % MT SOLN
15.0000 mL | Freq: Once | OROMUCOSAL | Status: AC
  Administered 2019-09-05: 15 mL via ORAL
  Filled 2019-09-05: qty 15

## 2019-09-05 MED ORDER — ONDANSETRON HCL 4 MG/2ML IJ SOLN
4.0000 mg | Freq: Four times a day (QID) | INTRAMUSCULAR | Status: DC | PRN
  Administered 2019-09-05 (×2): 4 mg via INTRAVENOUS
  Filled 2019-09-05 (×2): qty 2

## 2019-09-05 MED ORDER — OXYCODONE HCL 5 MG PO TABS
5.0000 mg | ORAL_TABLET | Freq: Four times a day (QID) | ORAL | Status: DC | PRN
  Administered 2019-09-05 (×2): 5 mg via ORAL
  Filled 2019-09-05 (×2): qty 1

## 2019-09-05 MED ORDER — ASPIRIN EC 81 MG PO TBEC
81.0000 mg | DELAYED_RELEASE_TABLET | Freq: Every day | ORAL | Status: DC
  Administered 2019-09-05 – 2019-09-06 (×2): 81 mg via ORAL
  Filled 2019-09-05 (×2): qty 1

## 2019-09-05 MED ORDER — ALUM & MAG HYDROXIDE-SIMETH 200-200-20 MG/5ML PO SUSP
30.0000 mL | Freq: Once | ORAL | Status: AC
  Administered 2019-09-05: 30 mL via ORAL
  Filled 2019-09-05: qty 30

## 2019-09-05 MED ORDER — MORPHINE SULFATE (PF) 2 MG/ML IV SOLN
2.0000 mg | INTRAVENOUS | Status: DC | PRN
  Administered 2019-09-05 (×2): 2 mg via INTRAVENOUS
  Filled 2019-09-05 (×2): qty 1

## 2019-09-05 MED ORDER — SODIUM CHLORIDE 0.9% FLUSH
10.0000 mL | Freq: Two times a day (BID) | INTRAVENOUS | Status: DC
  Administered 2019-09-05: 10 mL via INTRAVENOUS

## 2019-09-05 MED ORDER — INSULIN DETEMIR 100 UNIT/ML ~~LOC~~ SOLN
30.0000 [IU] | Freq: Every day | SUBCUTANEOUS | Status: DC
  Administered 2019-09-05 – 2019-09-06 (×2): 30 [IU] via SUBCUTANEOUS
  Filled 2019-09-05 (×3): qty 0.3

## 2019-09-05 MED ORDER — CHLORHEXIDINE GLUCONATE CLOTH 2 % EX PADS
6.0000 | MEDICATED_PAD | Freq: Every day | CUTANEOUS | Status: DC
  Administered 2019-09-05: 6 via TOPICAL

## 2019-09-05 MED ORDER — INSULIN ASPART 100 UNIT/ML ~~LOC~~ SOLN: 0.0000 [IU] | Freq: Every day | SUBCUTANEOUS | Status: DC

## 2019-09-05 MED ORDER — NITROGLYCERIN 2 % TD OINT
0.5000 [in_us] | TOPICAL_OINTMENT | Freq: Four times a day (QID) | TRANSDERMAL | Status: DC
  Administered 2019-09-05 – 2019-09-06 (×4): 0.5 [in_us] via TOPICAL
  Filled 2019-09-05 (×4): qty 1

## 2019-09-05 MED ORDER — ATORVASTATIN CALCIUM 80 MG PO TABS
80.0000 mg | ORAL_TABLET | Freq: Every evening | ORAL | Status: DC
  Administered 2019-09-05: 80 mg via ORAL
  Filled 2019-09-05: qty 1

## 2019-09-05 MED ORDER — POTASSIUM CHLORIDE 10 MEQ/100ML IV SOLN
10.0000 meq | INTRAVENOUS | Status: AC
  Administered 2019-09-05 (×6): 10 meq via INTRAVENOUS
  Filled 2019-09-05 (×6): qty 100

## 2019-09-05 MED ORDER — ENOXAPARIN SODIUM 40 MG/0.4ML ~~LOC~~ SOLN
40.0000 mg | SUBCUTANEOUS | Status: DC
  Administered 2019-09-05 – 2019-09-06 (×2): 40 mg via SUBCUTANEOUS
  Filled 2019-09-05 (×2): qty 0.4

## 2019-09-05 MED ORDER — ACETAMINOPHEN 325 MG PO TABS
650.0000 mg | ORAL_TABLET | ORAL | Status: DC | PRN
  Administered 2019-09-05: 650 mg via ORAL
  Filled 2019-09-05: qty 2

## 2019-09-05 MED ORDER — CLOPIDOGREL BISULFATE 75 MG PO TABS
75.0000 mg | ORAL_TABLET | Freq: Every day | ORAL | Status: DC
  Administered 2019-09-05 – 2019-09-06 (×2): 75 mg via ORAL
  Filled 2019-09-05 (×2): qty 1

## 2019-09-05 MED ORDER — INSULIN ASPART 100 UNIT/ML ~~LOC~~ SOLN
0.0000 [IU] | Freq: Three times a day (TID) | SUBCUTANEOUS | Status: DC
  Administered 2019-09-05: 5 [IU] via SUBCUTANEOUS
  Administered 2019-09-05 – 2019-09-06 (×2): 3 [IU] via SUBCUTANEOUS
  Administered 2019-09-06: 2 [IU] via SUBCUTANEOUS
  Filled 2019-09-05 (×4): qty 1

## 2019-09-05 MED ORDER — BACLOFEN 10 MG PO TABS
10.0000 mg | ORAL_TABLET | Freq: Three times a day (TID) | ORAL | Status: DC
  Administered 2019-09-05 – 2019-09-06 (×3): 10 mg via ORAL
  Filled 2019-09-05 (×7): qty 1

## 2019-09-05 MED ORDER — GABAPENTIN 300 MG PO CAPS
600.0000 mg | ORAL_CAPSULE | Freq: Two times a day (BID) | ORAL | Status: DC
  Administered 2019-09-05 – 2019-09-06 (×3): 600 mg via ORAL
  Filled 2019-09-05 (×3): qty 2

## 2019-09-05 MED ORDER — AMLODIPINE BESYLATE 10 MG PO TABS
10.0000 mg | ORAL_TABLET | Freq: Every day | ORAL | Status: DC
  Administered 2019-09-05 – 2019-09-06 (×2): 10 mg via ORAL
  Filled 2019-09-05 (×2): qty 1

## 2019-09-05 MED ORDER — REGADENOSON 0.4 MG/5ML IV SOLN
0.4000 mg | Freq: Once | INTRAVENOUS | Status: DC
  Filled 2019-09-05: qty 5

## 2019-09-05 MED ORDER — GLIPIZIDE 5 MG PO TABS
2.5000 mg | ORAL_TABLET | Freq: Every day | ORAL | Status: DC
  Administered 2019-09-05: 2.5 mg via ORAL
  Filled 2019-09-05: qty 0.5

## 2019-09-05 MED ORDER — REGADENOSON 0.4 MG/5ML IV SOLN
0.4000 mg | Freq: Once | INTRAVENOUS | Status: AC
  Administered 2019-09-06: 0.4 mg via INTRAVENOUS
  Filled 2019-09-05: qty 5

## 2019-09-05 NOTE — Consult Note (Addendum)
Cardiology Consult    Patient ID: Evan HONAKER Sr. MRN: [id number], DOB/AGE: 22-Dec-1955   Admit date: 09/04/2019 Date of Consult: 09/05/2019  Primary Physician: Doren Custard, FNP Primary Cardiologist: [name], MD Requesting Provider: C. Danford, MD  Patient Profile    Evan CAPRARO Sr. is a 64 y.o. male with a history of strokes, L hemiparesis, HTN, HL, poorly controlled DM, cerebrovascular and carotid dzs, and ongoing tob abuse who is being seen today for the evaluation of chest pain at the request of Dr. Maryfrances Bunnell.  Past Medical History   Past Medical History:  Diagnosis Date  . Carotid arterial disease (HCC)    a. 08/2018 Carotid U/S: min-mod RICA atherosclerosis w/o hemodynamically significant stenosis. Nl LICA.  . Diabetes 1.5, managed as type 2 (HCC)   . Diastolic dysfunction    a. 08/2018 Echo: EF 65%. No rwma. Gr1 DD. Mild MR.  Marland Kitchen Hypercholesterolemia   . Hypertension   . Left hemiparesis (HCC)    a. Ambulated w/ Cane. Limited use of LUE.  . Pain in both feet   . Poorly controlled diabetes mellitus (HCC)    a. 04/2019 A1c 13.8.  Marland Kitchen Recurrent strokes (HCC)    a. 10/2016 MRI/A: Acute 85mm R thalamic infarct, ? subacute infarct of R corona radiata; b. 08/2017 MRI/A: Acute 36mm lateral L thalamic infarct. Other more remote lacunar infarcts of thalami bilat. Small vessel dzs; c. 08/2018 MRI/A: Acute lacunar infarct of the post limb of R internal capsule. Underlying advanced chrnoic small vessel dzs w/o hemodynamically significant stenosis.  . Tobacco abuse     Past Surgical History:  Procedure Laterality Date  . ESOPHAGOGASTRODUODENOSCOPY (EGD) WITH PROPOFOL N/A 04/26/2019   Procedure: ESOPHAGOGASTRODUODENOSCOPY (EGD) WITH PROPOFOL;  Surgeon: Toney Reil, MD;  Location: [facility] ENDOSCOPY;  Service: Gastroenterology;  Laterality: N/A;     Allergies  No Known Allergies  History of Present Illness    64 y/o ? with the above complex PMH including strokes in  10/2016, 08/2017, and 08/2018 w/ known moderate RICA and diffuse cerebrovascular dzs and residual L hemiparesis, HTN, HL, poorly controlled DM (A1c 13.8 04/2019), and ongoing tob abuse.  He has no prior cardiac hx.  He lives in [place] w/ his wife.  He initially says that he is active and walks often, but then notes that he uses a cane and can only walk about 25 yds, otw limited by 'stumbling.'  He uses a scooter for longer distances.  He does not typically experience DOE.  Prior echo in 08/2018 showed nl LVEF w/ grade 1 DD.  He was in his usoh until the evening of 1/18, when he developed nausea.  Because of this, he says that he skipped dinner.  Around 20:00, he was sitting and watching TV, when he developed severe, 10/10 central and L chest heaviness w/ worsening nausea, diaphoresis, and vomiting x 1.  He denies any assoc dyspnea.  Chest pain was not worsened by palpation, deep breathing, or positioning.  Pain did not improve after vomiting.  Due to ongoing Ss, his wife called EMS and he was taken to the [facility] ED.  Here, ECG w/ RBBB (old) - no acute changes.  Blood glucose 524 on arrival.  HsTrop 6 w/ subsequent values of 5  7.  CXR w/o acute findings. He was placed on insulin infusion and admitted to ICU.  He continues to report 10/10 chest heaviness - no change w/ palpation.  He also became nauseated while I was examining  him this AM and required prn zofran from nsg staff.  No events on tele.  Inpatient Medications    . amLODipine  10 mg Oral Daily  . aspirin EC  81 mg Oral Daily  . atorvastatin  80 mg Oral QPM  . baclofen  10 mg Oral TID  . Chlorhexidine Gluconate Cloth  6 each Topical Daily  . clopidogrel  75 mg Oral Daily  . enoxaparin (LOVENOX) injection  40 mg Subcutaneous Q24H  . gabapentin  600 mg Oral BID  . glipiZIDE  2.5 mg Oral QAC breakfast  . insulin detemir  30 Units Subcutaneous Daily  . pantoprazole  40 mg Oral Daily    Family History    Family History  Problem Relation Age of  Onset  . Hypertension Mother   . Stroke Mother        died @ age 52  . Hypertension Father   . Diabetes Father   . Heart attack Father        died @ 27   He indicated that his mother is deceased. He indicated that his father is deceased.   Social History    Social History   Socioeconomic History  . Marital status: Married    Spouse name: Solicitor  . Number of children: 5  . Years of education: Not on file  . Highest education level: Not on file  Occupational History  . Occupation: unemployed  Tobacco Use  . Smoking status: Current Every Day Smoker    Packs/day: 1.00    Years: 38.00    Pack years: 38.00    Types: Cigarettes    Start date: 50  . Smokeless tobacco: Never Used  Substance and Sexual Activity  . Alcohol use: Not Currently    Alcohol/week: 1.0 standard drinks    Types: 1 Cans of beer per week    Comment: previously drank but nothing in 1-2 yrs (08/2019).  . Drug use: Not Currently    Types: Cocaine, Marijuana    Comment: prev used cocaine/marijuana but none x 1-2 yrs (08/2019).  . Sexual activity: Yes  Other Topics Concern  . Not on file  Social History Narrative   Lives in [place] with his wife.  Uses a cane when ambulating.  Can walk about 25 yds prior to having to rest - says he stumbles if he has to work further than that.  Usually uses a scooter to get around stores.   Social Determinants of Health   Financial Resource Strain:   . Difficulty of Paying Living Expenses: Not on file  Food Insecurity:   . Worried About Programme researcher, broadcasting/film/video in the Last Year: Not on file  . Ran Out of Food in the Last Year: Not on file  Transportation Needs: No Transportation Needs  . Lack of Transportation (Medical): No  . Lack of Transportation (Non-Medical): No  Physical Activity: Unknown  . Days of Exercise per Week: 0 days  . Minutes of Exercise per Session: Not asked  Stress: No Stress Concern Present  . Feeling of Stress : Only a little  Social  Connections:   . Frequency of Communication with Friends and Family: Not on file  . Frequency of Social Gatherings with Friends and Family: Not on file  . Attends Religious Services: Not on file  . Active Member of Clubs or Organizations: Not on file  . Attends Banker Meetings: Not on file  . Marital Status: Not on file  Intimate Partner  Violence:   . Fear of Current or Ex-Partner: Not on file  . Emotionally Abused: Not on file  . Physically Abused: Not on file  . Sexually Abused: Not on file     Review of Systems    General:  No chills, fever, night sweats or weight changes. He did develop diaphoresis in the setting of c/p and nausea prior to admission. Cardiovascular:  +++ constant 10/10 central and L chest pain since 20:00 on 1/18, no dyspnea on exertion, edema, orthopnea, palpitations, paroxysmal nocturnal dyspnea. Dermatological: No rash, lesions/masses Respiratory: No cough, dyspnea Urologic: No hematuria, dysuria Abdominal:   +++ nausea, +++ vomiting x1 , no diarrhea, bright red blood per rectum, melena, or hematemesis Neurologic:  No visual changes, wkns, changes in mental status. All other systems reviewed and are otherwise negative except as noted above.  Physical Exam    Blood pressure 138/73, pulse 68, temperature (!) 88 F (31.1 C), temperature source Axillary, resp. rate 12, height 5\' 6"  (1.676 m), weight 77.1 kg, SpO2 99 %.  General: Pleasant, NAD Psych: Normal affect. Neuro: Alert and oriented X 3. L hemiparesis - left arm persistently flexed @ elbow and abducted @ shoulder. HEENT: L facial droop.  Neck: Supple without bruits or JVD. Lungs:  Resp regular and unlabored, bibasilar crackles. Heart: RRR no s3, s4, or murmurs. Abdomen: Soft, non-tender, non-distended, BS + x 4.  Extremities: No clubbing, cyanosis or edema. DP/PT/Radials 1+ and equal bilaterally.  Labs    Cardiac Enzymes Recent Labs  Lab 09/04/19 2154 09/04/19 2334 09/05/19 0559    TROPONINIHS 6 5 7       Lab Results  Component Value Date   WBC 5.7 09/04/2019   HGB 11.9 (L) 09/04/2019   HCT 36.1 (L) 09/04/2019   MCV 86.6 09/04/2019   PLT 199 09/04/2019    Recent Labs  Lab 09/05/19 0559  NA 145  K 2.7*  CL 109  CO2 23  BUN 26*  CREATININE 1.36*  CALCIUM 9.9  GLUCOSE 257*   Lab Results  Component Value Date   CHOL 131 01/31/2019   HDL 47 01/31/2019   LDLCALC 54 01/31/2019   TRIG 239 (H) 01/31/2019    Radiology Studies    DG Chest Port 1 View  Result Date: 09/04/2019 CLINICAL DATA:  Chest pain EXAM: PORTABLE CHEST 1 VIEW COMPARISON:  None. FINDINGS: The heart size and mediastinal contours are within normal limits. Both lungs are clear. The visualized skeletal structures are unremarkable. IMPRESSION: No active disease. Electronically Signed   By: 02/02/2019 M.D.   On: 09/04/2019 22:12    ECG & Cardiac Imaging    RSR, 76, RBBB, nonspecific T changes  - personally reviewed.  Assessment & Plan    1.  Midsternal and L chest pain:  Pt w/o prior cardiac hx but w/ multiple RF including prior strokes, moderate, diffuse cerebrovascular and RICA dzs, HTN, HL, poorly controlled DM, and tob abuse, presented to the ED on the evening of 1/18 after developing nausea followed by 10/10 midsternal and L chest pain assoc w/ worsening nausea, and vomiting.  ECG w/ chronic RBBB and otw w/o acute changes.  He cont to have 10/10 c/p that is not worsened w/ palpation, deep breathing, or position changes.  Despite prolonged Ss, HsTroponins are normal @ 6  5  7.  Given multiple RF, it is reasonable to pursue stress testing while he is an inpatient, however, as he remains on an insulin gtt this AM, and w/ ongoing  nausea, would defer until tomorrow morning.  In the absence of objective evidence of ischemia thus far, rec eval for non-cardiac causes of c/p as well.  2.  Hyperglycemia/Poorly controlled DMII:  A1c 13.8 in Sept 2020.  Gluc 524 on arrival.  Currently on insulin gtt  w/ plan to transition to lantus/SSI this AM.  Per IM.  3.  Essential HTN:  Pressures trending high 130's to 160's.  On amlodipine 10 daily here.  Also on Lisinopril and HCTZ @ home - held in setting of creat mildly elev above baseline on presentation (1.45 1/18  1.36 this AM).  Plan to resume.  4. HL:  LDL 52 last Jan.  On high potency statin.  5.  H/o strokes w/ cerebrovascular/carotid dzs:  Cont statin, asa, plavix.  6.  Tob abuse:  Cessation advised.  Still smoking 1ppd. Says he trying to cut back.  7.  Hypokalemia:  IV supplementation ordered.  8.  Nausea:  Prn zofran ordered. Mgmt per IM.  Signed, Murray Hodgkins, NP 09/05/2019, 8:53 AM  For questions or updates, please contact   Please consult [email or web address] for contact info under Cardiology/STEMI.

## 2019-09-05 NOTE — Progress Notes (Signed)
Pt transferred to room 250, report called to Amy, RN.  Pt placed on tele box, CCMD notified, VSS on room air, pt agreeablt ot transfer, all his belongings sent with him along with his paper chart and medications

## 2019-09-05 NOTE — Progress Notes (Signed)
While visiting on ICU unit, we notice this gentleman sitting up and having a meal. Commenting to ourselves how rare of a sight this was. I stopped in to chat with him. He was sitting up in bed and attempting to get his meal eaten. He has been here a few days, he states he got sick at home. He was glad to be moving out of ICU later today and stated he hoped to be all better soon. I stayed to talk for a moment and left him with prayer.

## 2019-09-05 NOTE — Progress Notes (Signed)
PROGRESS NOTE    Evan CAUDILL Sr.  RJJ:[id number] DOB: 08/02/1956 DOA: 09/04/2019 PCP: Doren Custard, FNP      Brief Narrative:  Mr. Criswell is a 64 y.o. M with IDDM, HTN, CVA with left hemiparesis, D CHF, and PVD carotid stent with acute chest pain.  The patient was watching television when he had sudden onset of left-sided chest pain associated with nausea and diaphoresis but no shortness of breath.  In the ER, glucose 524, creatinine 1.45 (baseline 1.2), anion gap slightly elevated. Troponin within normal limits, EKG showed old RBBB, chest x-ray clear, D-dimer less than the age-adjusted upper limit of normal.  He was started on insulin drip, admitted for DKA, and chest pain.        Assessment & Plan:  DKA Poorly controlled diabetes Hemoglobin A1c 9% last summer Gap closed overnight -Stop insulin drip -Start home Levemir -Start sliding scale correction insulin   Chest pain This persist, only relieved by oxycodone. Doubt PE given normal D-dimer, doubt DKA given persistence after normalizing gap. It is somewhat atypical for angina, although he has numerous risk factors. -Consult cardiology -N.p.o. for stress test -Obtain echocardiogram  Hypokalemia -Replete potassium  Acute kidney injury Creatinine normalized overnight with fluids. Anemia of chronic kidney disease Hemoglobin stable relative baseline, iron studies normal.  Hypertension History of cerebrovascular disease Blood pressure controlled -Continue aspirin, atorvastatin, Plavix -Continue amlodipine  Chronic pain -Continue baclofen, gabapentin           Disposition: The patient was admitted with DKA and chest pain. At this time, continued inpatient services are reasonable and expected, given the patient's comorbid stroke with residual hemiparesis, insulin-dependent diabetes, kidney injury at presentation and the high likelihood of an adverse outcome, including readmission, debility or death  if the patient were to be discharged prematurely without further ischemic evaluation by cardiology.     .        MDM: The below labs and imaging reports were reviewed and summarized above.  Medication management as above.    DVT prophylaxis: Lovenox Code Status: Full code Family Communication:     Consultants:   Cardiology  Procedures:   Echocardiogram pending  Antimicrobials:      Culture data:              Subjective: Patient still has some chest pain, not worsening with food or position. He has no respiratory distress, no hypoxia, no vomiting. He is incontinent.  Objective: Vitals:   09/05/19 1200 09/05/19 1220 09/05/19 1300 09/05/19 1523  BP: 140/74  (!) 145/75 (!) 143/79  Pulse: 75  74 78  Resp: 14  11 15   Temp: (!) 94.1 F (34.5 C) (!) 94.1 F (34.5 C)  97.7 F (36.5 C)  TempSrc: Rectal Rectal    SpO2: 97%  98% 100%  Weight:    73.1 kg  Height:    5\' 5"  (1.651 m)    Intake/Output Summary (Last 24 hours) at 09/05/2019 1801 Last data filed at 09/05/2019 1300 Gross per 24 hour  Intake 1978.15 ml  Output 0 ml  Net 1978.15 ml   Filed Weights   09/04/19 2139 09/05/19 1523  Weight: 77.1 kg 73.1 kg    Examination: General appearance:  adult male, alert and in no acute distress. Somewhat impulsive. HEENT: Anicteric, conjunctiva pink, lids and lashes normal. No nasal deformity, discharge, epistaxis.  Lips moist.   Skin: Warm and dry.     No suspicious rashes or lesions. Cardiac: RRR, nl S1-S2, no  murmurs appreciated.  Capillary refill is brisk  JVP normal.  No LE edema.  Radial  pulses 2+ and symmetric. Respiratory: Normal respiratory rate and rhythm.  CTAB without rales or wheezes. Abdomen: Abdomen soft. No TTP or guarding. No ascites, distension, hepatosplenomegaly.   MSK: No deformities or effusions. Neuro: Awake and alert.  EOMI, mild left-sided hemiparesis. Speech fluent.    Psych: Sensorium intact and responding to questions,  attention normal. Affect blunted.  Judgment and insight appear impaired.    Data Reviewed: I have personally reviewed following labs and imaging studies:  CBC: Recent Labs  Lab 09/04/19 2154  WBC 5.7  HGB 11.9*  HCT 36.1*  MCV 86.6  PLT 199   Basic Metabolic Panel: Recent Labs  Lab 09/04/19 2154 09/05/19 0559 09/05/19 0902  NA 137 145 146*  K 3.9 2.7* 2.9*  CL 98 109 108  CO2 23 23 25   GLUCOSE 524* 257* 147*  BUN 27* 26* 24*  CREATININE 1.45* 1.36* 1.24  CALCIUM 10.2 9.9 9.9   GFR: Estimated Creatinine Clearance: 53.7 mL/min (by C-G formula based on SCr of 1.24 mg/dL). Liver Function Tests: No results for input(s): AST, ALT, ALKPHOS, BILITOT, PROT, ALBUMIN in the last 168 hours. No results for input(s): LIPASE, AMYLASE in the last 168 hours. No results for input(s): AMMONIA in the last 168 hours. Coagulation Profile: No results for input(s): INR, PROTIME in the last 168 hours. Cardiac Enzymes: No results for input(s): CKTOTAL, CKMB, CKMBINDEX, TROPONINI in the last 168 hours. BNP (last 3 results) No results for input(s): PROBNP in the last 8760 hours. HbA1C: No results for input(s): HGBA1C in the last 72 hours. CBG: Recent Labs  Lab 09/05/19 0642 09/05/19 0752 09/05/19 0944 09/05/19 1142 09/05/19 1727  GLUCAP 216* 174* 120* 169* 212*   Lipid Profile: No results for input(s): CHOL, HDL, LDLCALC, TRIG, CHOLHDL, LDLDIRECT in the last 72 hours. Thyroid Function Tests: No results for input(s): TSH, T4TOTAL, FREET4, T3FREE, THYROIDAB in the last 72 hours. Anemia Panel: Recent Labs    09/05/19 0559  VITAMINB12 569  FOLATE 11.8  TIBC 363  IRON 85   Urine analysis:    Component Value Date/Time   COLORURINE YELLOW (A) 09/01/2018 1752   APPEARANCEUR CLEAR (A) 09/01/2018 1752   LABSPEC 1.018 09/01/2018 1752   PHURINE 6.0 09/01/2018 1752   GLUCOSEU 150 (A) 09/01/2018 1752   HGBUR NEGATIVE 09/01/2018 1752   BILIRUBINUR NEGATIVE 09/01/2018 1752    KETONESUR NEGATIVE 09/01/2018 1752   PROTEINUR 100 (A) 09/01/2018 1752   NITRITE NEGATIVE 09/01/2018 1752   LEUKOCYTESUR NEGATIVE 09/01/2018 1752   Sepsis Labs: @LABRCNTIP (procalcitonin:4,lacticacidven:4)  ) Recent Results (from the past 240 hour(s))  SARS CORONAVIRUS 2 (TAT 6-24 HRS) Nasopharyngeal Nasopharyngeal Swab     Status: None   Collection Time: 09/04/19 11:34 PM   Specimen: Nasopharyngeal Swab  Result Value Ref Range Status   SARS Coronavirus 2 NEGATIVE NEGATIVE Final    Comment: (NOTE) SARS-CoV-2 target nucleic acids are NOT DETECTED. The SARS-CoV-2 RNA is generally detectable in upper and lower respiratory specimens during the acute phase of infection. Negative results do not preclude SARS-CoV-2 infection, do not rule out co-infections with other pathogens, and should not be used as the sole basis for treatment or other patient management decisions. Negative results must be combined with clinical observations, patient history, and epidemiological information. The expected result is Negative. Fact Sheet for Patients: Fact Sheet for Healthcare Providers: 09/06/19 This test is not yet approved or cleared by the  [place] Architectural technologist and  has been authorized for detection and/or diagnosis of SARS-CoV-2 by FDA under an Print production planner (EUA). This EUA will remain  in effect (meaning this test can be used) for the duration of the COVID-19 declaration under Section 56 4(b)(1) of the Act, 21 U.S.C. section 360bbb-3(b)(1), unless the authorization is terminated or revoked sooner. Performed at [facility], [place] [address], [place], [place] [address]   MRSA PCR Screening     Status: None   Collection Time: 09/05/19  5:43 AM   Specimen: Nasopharyngeal  Result Value Ref Range Status   MRSA by PCR NEGATIVE NEGATIVE Final    Comment:        The GeneXpert MRSA Assay (FDA approved for  NASAL specimens only), is one component of a comprehensive MRSA colonization surveillance program. It is not intended to diagnose MRSA infection nor to guide or monitor treatment for MRSA infections. Performed at [facility], [address] [place], [place] [address]          Radiology Studies: [facility] Chest [place] 1 View  Result Date: 09/04/2019 CLINICAL DATA:  Chest pain EXAM: PORTABLE CHEST 1 VIEW COMPARISON:  None. FINDINGS: The heart size and mediastinal contours are within normal limits. Both lungs are clear. The visualized skeletal structures are unremarkable. IMPRESSION: No active disease. Electronically Signed   By: Donavan Foil M.D.   On: 09/04/2019 22:12        Scheduled Meds: . amLODipine  10 mg Oral Daily  . aspirin EC  81 mg Oral Daily  . atorvastatin  80 mg Oral QPM  . baclofen  10 mg Oral TID  . Chlorhexidine Gluconate Cloth  6 each Topical Daily  . clopidogrel  75 mg Oral Daily  . enoxaparin (LOVENOX) injection  40 mg Subcutaneous Q24H  . gabapentin  600 mg Oral BID  . insulin aspart  0-15 Units Subcutaneous TID WC  . insulin aspart  0-5 Units Subcutaneous QHS  . insulin detemir  30 Units Subcutaneous Daily  . pantoprazole  40 mg Oral Daily  . [START ON 09/06/2019] regadenoson  0.4 mg Intravenous Once   Continuous Infusions:   LOS: 0 days    Time spent: 25 minutes    Edwin Dada, MD [facility] 09/05/2019, 6:01 PM     Please page though [place] or Epic secure chat:  For [employer], Adult nurse

## 2019-09-05 NOTE — Therapy (Signed)
[place] [facility] MAIN [facility] SERVICES [address] [place], [place], [address] Phone: [phone]   Fax:  [phone]  Speech Language Pathology Evaluation  Patient Details  Name: Evan PHAM Sr. MRN: [id number] Date of Birth: 01/30/1956 Referring Provider (SLP): Maurice Small   Encounter Date: 09/04/2019  End of Session - 09/05/19 1104    Visit Number  1    Number of Visits  6    Date for SLP Re-Evaluation  10/20/19    Authorization Type  Medicaid    SLP Start Time  1000    SLP Stop Time   1050    SLP Time Calculation (min)  50 min    Activity Tolerance  Patient tolerated treatment well       Past Medical History:  Diagnosis Date  . Carotid arterial disease (HCC)    a. 08/2018 Carotid U/S: min-mod RICA atherosclerosis w/o hemodynamically significant stenosis. Nl LICA.  . Diabetes 1.5, managed as type 2 (HCC)   . Diastolic dysfunction    a. 08/2018 Echo: EF 65%. No rwma. Gr1 DD. Mild MR.  Marland Kitchen Hypercholesterolemia   . Hypertension   . Left hemiparesis (HCC)    a. Ambulated w/ Cane. Limited use of LUE.  . Pain in both feet   . Poorly controlled diabetes mellitus (HCC)    a. 04/2019 A1c 13.8.  Marland Kitchen Recurrent strokes (HCC)    a. 10/2016 MRI/A: Acute 59mm R thalamic infarct, ? subacute infarct of R corona radiata; b. 08/2017 MRI/A: Acute 25mm lateral L thalamic infarct. Other more remote lacunar infarcts of thalami bilat. Small vessel dzs; c. 08/2018 MRI/A: Acute lacunar infarct of the post limb of R internal capsule. Underlying advanced chrnoic small vessel dzs w/o hemodynamically significant stenosis.  . Tobacco abuse     Past Surgical History:  Procedure Laterality Date  . ESOPHAGOGASTRODUODENOSCOPY (EGD) WITH PROPOFOL N/A 04/26/2019   Procedure: ESOPHAGOGASTRODUODENOSCOPY (EGD) WITH PROPOFOL;  Surgeon: Toney Reil, MD;  Location: [facility] ENDOSCOPY;  Service: Gastroenterology;  Laterality: N/A;    There were no vitals filed for this  visit.      SLP Evaluation [facility] - 09/05/19 1057      SLP Visit Information   SLP Received On  09/04/19    Referring Provider (SLP)  Maurice Small    Onset Date  08/09/2019    Medical Diagnosis  CVA      Subjective   Subjective  The patient reports that speaking is difficult. 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  He was referred to speech therapy by Dr. Maurice Small on 08/09/2019: "Pt wife would like a order for her husband to having PT to learn how to walking and use his hands and speech therapy to learn had to talk again. Pt had a stroke." Patient presents with mild dysarthria secondary to CVA. The stroke occurred on September 02, 2019. MRI results were reported: "Positive for acute lacunar infarct of the posterior limb right internal capsule. No associated hemorrhage or mass effect." Patient did receive some inpatient SLP services immediately following the CVA in January 2020 for dysphagia, mild cognitive deficits and moderate dysarthria. Patient's medical history is significant for multiple infarcts, including one in March 2018 and one in February 2019. Patient was independent prior to the CVA in January 2020.  Oral Motor/Sensory Function   Overall Oral Motor/Sensory Function  Impaired      Motor Speech   Overall Motor Speech  Impaired      Standardized Assessments   Standardized Assessments   Other Assessment   Oral Motor / Motor Speech Evaluation      Oral Motor / Motor Speech Evaluation Lips: Decreased ROM, strength within normal limits, moderately decreased planning/coordination for rapid alternating movements, moderately decreased planning and coordination for one-step oral motor tasks.  Tongue: ROM and strength within normal limits, mild difficulty with tongue lateralization task. Jaw: Within normal  limits. Soft palate: Within normal limits, no deviation noted. Oral agility: Moderately decreased planning/coordination for rapid alternating movements. Difficulty with oral motor tasks. Sensory: Within normal limits. Voice: Severely strained vocal quality. Patient sustained a voiceless alveolar fricative (/s/) for 6 seconds and was unable to produce a voiced alveolar fricative (/z/). No vocal tremor noted. Patient unable to vary pitch when prompted and when given an immediate clinician model. Patient unable to voluntarily cough, but reported that he can cough "when it goes down the wrong pipe". Respiration:  Patient was able to sustain /a/ for a maximum of 12 seconds. Patient has reduced word-per-breath ratio. When prompted, patient was unable to vary his loudness from loud to soft and vice-versa. Patient was able to demonstrate a slow, gradual inspiration and a slow, gradual expiration but was unable to pant. Resonance:  Some mild hypernasality noted. Intelligibility: Mostly intelligible, low volume spontaneous speech.  SLP Education - 09/05/19 1103    Education Details  Results and recommendations    Person(s) Educated  Patient;Spouse    Methods  Explanation    Comprehension  Verbalized understanding         SLP Long Term Goals - 09/05/19 1109      SLP LONG TERM GOAL #1   Title  The patient will demonstrate independent completion of extrinsic laryngeal muscle stretches.    Baseline  Patient very tight, requires supervision for accurate performance    Time  6    Period  Weeks    Status  New    Target Date  10/20/19      SLP LONG TERM GOAL #2   Title  The patient will be independent for abdominal breathing and breath support exercises.    Baseline  Patient requires max cues to execute accurately    Time  6    Period  Weeks    Status  New    Target Date  10/20/19      SLP LONG TERM GOAL #3   Title  The patient will minimize vocal tension via flow phonation and/or resonant voice  therapy (or comparable techniques) with min SLP cues with 80% accuracy.    Baseline  Tight/strained vocal quality    Time  6    Period  Weeks    Status  New    Target Date  10/20/19      SLP LONG TERM GOAL #4   Title  Pt will improve speech intelligibility for sentences by controlling rate of speech, over-articulation, and increased loudness to achieve 80% intelligibility.    Baseline  Patient cannot increase volume without vocal strain    Time  6    Period  Weeks    Status  New    Target Date  10/20/19       Plan - 09/05/19 1108    Clinical Impression Statement  At 11 months post onset of an acute thalamic infarct,  the patient presents with mild-moderate dysarthria characterized by imprecise articulation and strained vocal quality with limited pitch and volume control.  She will benefit from skilled speech therapy for education, to improve speech intelligibility, improve breath support, promote easy flow phonation, and learn techniques to increase loudness and pitch range without strain.    Speech Therapy Frequency  1x /week    Duration  Other (comment)   6 weeks   Potential to Achieve Goals  Good    Potential Considerations  Ability to learn/carryover information;Previous level of function;Severity of impairments;Cooperation/participation level;Family/community support    SLP Home Exercise Plan  Provided    Consulted and Agree with Plan of Care  Patient;Family member/caregiver    Family Member Consulted  Spouse       Patient will benefit from skilled therapeutic intervention in order to improve the following deficits and impairments:   Dysarthria and anarthria - Plan: SLP plan of care cert/re-cert    Problem List Patient Active Problem List   Diagnosis Date Noted  . Chest pain 09/05/2019  . History of CVA (cerebrovascular accident) 09/05/2019  . Hyperglycemia 09/05/2019  . Anemia 09/05/2019  . AKI (acute kidney injury) (HCC) 09/05/2019  . Atypical pneumonia 09/05/2019  .  Coagulation disorder (HCC) 06/12/2019  . Gastroesophageal reflux disease   . Hemiplegia and hemiparesis following cerebral infarction affecting left non-dominant side (HCC) 11/25/2018  . 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 RBBB 10/15/2018  . Sinus bradycardia 10/15/2018  . Dysphagia, post-stroke   . Neuropathic pain   . Lacunar infarct, acute (HCC) 09/06/2018  . TIA (transient ischemic attack) 09/01/2018  . Microalbuminuria 07/29/2018  . Left leg pain 01/13/2018  . Hyperlipidemia 02/11/2017  . Essential hypertension 11/10/2016  . Diabetes (HCC) 11/10/2016  . Tobacco abuse 08/28/2013   Dollene Primrose, MS/CCC- SLP  Leandrew Koyanagi 09/05/2019, 11:17 AM  [place] [facility] MAIN [facility] SERVICES [address] [place], [place], [address] Phone: [phone]   Fax:  [phone]  Name: Evan SUSMAN Sr. MRN: [id number] Date of Birth: October 19, 1955

## 2019-09-05 NOTE — Progress Notes (Signed)
Inpatient Diabetes Program Recommendations  AACE/ADA: New Consensus Statement on Inpatient Glycemic Control   Target Ranges:  Prepandial:   less than 140 mg/dL      Peak postprandial:   less than 180 mg/dL (1-2 hours)      Critically ill patients:  140 - 180 mg/dL  Results for Evan Townsend, Evan Townsend (MRN [id number]) as of 09/05/2019 09:57  Ref. Range 09/05/2019 00:32 09/05/2019 01:13 09/05/2019 01:48 09/05/2019 02:24 09/05/2019 03:02 09/05/2019 04:23 09/05/2019 05:22 09/05/2019 07:52 09/05/2019 09:44  Glucose-Capillary Latest Ref Range: 70 - 99 mg/dL 520 (H) 802 (H) 233 (H) 366 (H) 392 (H) 249 (H) 291 (H) 174 (H) 120 (H)  Results for Evan Townsend, Evan Townsend (MRN [id number]) as of 09/05/2019 09:57  Ref. Range 09/04/2019 21:54  Glucose Latest Ref Range: 70 - 99 mg/dL 300 [facility])   Results for Evan Townsend, Evan SR. (MRN [id number]) as of 09/05/2019 09:57  Ref. Range 05/04/2019 14:39  Hemoglobin A1C Latest Ref Range: 4.0 - 5.6 % 13.8 (A)   Review of Glycemic Control  Diabetes history: DM2 Outpatient Diabetes medications: Glipizide 5 mg QAM, Levemir 30 units daily, Ozempic 0.25 mg Qweek (for 28 days and then increase to 0.5 mg Qweek) Current orders for Inpatient glycemic control: Levemir 30 units daily, Glipizide 2.5 mg QAM  Inpatient Diabetes Program Recommendations:   Insulin - Basal: Noted patient received Levemir 30 units at 9:09 am today.  Correction (SSI): Please consider ordering CBGs Q4H with Novolog 0-9 units Q4H.  Oral DM medication: Please discontinue Glipizide while inpatient.  HbgA1C: Please consider ordering an A1C to evaluate glycemic control over the past 2-3 months.  NOTE: In reviewing chart, initial glucose 524 mg/dl and patient was started on IV insulin and has been transitioned to SQ insulin. Patient was given Levemir 30 units at 9:09 am today.   Thanks, Orlando Penner, RN, MSN, CDE Diabetes Coordinator Inpatient Diabetes Program [phone] (Team Pager from 8am to 5pm)

## 2019-09-06 ENCOUNTER — Encounter: Payer: Self-pay | Admitting: Family Medicine

## 2019-09-06 ENCOUNTER — Other Ambulatory Visit: Payer: Self-pay | Admitting: Family Medicine

## 2019-09-06 ENCOUNTER — Inpatient Hospital Stay (HOSPITAL_COMMUNITY): Payer: Medicaid Other

## 2019-09-06 DIAGNOSIS — M792 Neuralgia and neuritis, unspecified: Secondary | ICD-10-CM

## 2019-09-06 DIAGNOSIS — M79605 Pain in left leg: Secondary | ICD-10-CM

## 2019-09-06 DIAGNOSIS — M542 Cervicalgia: Secondary | ICD-10-CM

## 2019-09-06 DIAGNOSIS — E1165 Type 2 diabetes mellitus with hyperglycemia: Secondary | ICD-10-CM | POA: Diagnosis not present

## 2019-09-06 DIAGNOSIS — R079 Chest pain, unspecified: Secondary | ICD-10-CM

## 2019-09-06 DIAGNOSIS — E7800 Pure hypercholesterolemia, unspecified: Secondary | ICD-10-CM

## 2019-09-06 LAB — NM MYOCAR MULTI W/SPECT W/WALL MOTION / EF
Estimated workload: 1 METS
Exercise duration (min): 0 min
Exercise duration (sec): 0 s
LV dias vol: 86 mL (ref 62–150)
LV sys vol: 34 mL
MPHR: 158 {beats}/min
Peak HR: 85 {beats}/min
Percent HR: 53 %
Rest HR: 77 {beats}/min
SDS: 0
SRS: 6
SSS: 4
TID: 0.85

## 2019-09-06 LAB — BASIC METABOLIC PANEL
Anion gap: 9 (ref 5–15)
BUN: 23 mg/dL (ref 8–23)
CO2: 25 mmol/L (ref 22–32)
Calcium: 9.4 mg/dL (ref 8.9–10.3)
Chloride: 108 mmol/L (ref 98–111)
Creatinine, Ser: 1.17 mg/dL (ref 0.61–1.24)
GFR calc Af Amer: >60 mL/min (ref >60)
GFR calc non Af Amer: >60 mL/min (ref >60)
Glucose, Bld: 228 mg/dL — ABNORMAL HIGH (ref 70–99)
Potassium: 4.9 mmol/L (ref 3.5–5.1)
Sodium: 142 mmol/L (ref 135–145)

## 2019-09-06 LAB — ECHOCARDIOGRAM COMPLETE
Height: 65 in
Weight: 2577.6 oz

## 2019-09-06 LAB — GLUCOSE, CAPILLARY
Glucose-Capillary: 188 mg/dL — ABNORMAL HIGH (ref 70–99)
Glucose-Capillary: 141 mg/dL — ABNORMAL HIGH (ref 70–99)

## 2019-09-06 LAB — HEMOGLOBIN A1C
Hgb A1c MFr Bld: 14.3 % — ABNORMAL HIGH (ref 4.8–5.6)
Mean Plasma Glucose: 363.71 mg/dL

## 2019-09-06 MED ORDER — TECHNETIUM TC 99M TETROFOSMIN IV KIT
10.0000 | PACK | Freq: Once | INTRAVENOUS | Status: AC | PRN
  Administered 2019-09-06: 10.3 via INTRAVENOUS

## 2019-09-06 MED ORDER — TECHNETIUM TC 99M TETROFOSMIN IV KIT
30.0000 | PACK | Freq: Once | INTRAVENOUS | Status: AC | PRN
  Administered 2019-09-06: 31.199 via INTRAVENOUS

## 2019-09-06 MED ORDER — ACETAMINOPHEN 325 MG PO TABS: 650.0000 mg | ORAL_TABLET | ORAL | Status: DC | PRN

## 2019-09-06 MED ORDER — LISINOPRIL 20 MG PO TABS
40.0000 mg | ORAL_TABLET | Freq: Every day | ORAL | Status: DC
  Administered 2019-09-06: 40 mg via ORAL
  Filled 2019-09-06: qty 2

## 2019-09-06 NOTE — Progress Notes (Signed)
Inpatient Diabetes Program Recommendations  AACE/ADA: New Consensus Statement on Inpatient Glycemic Control   Target Ranges:  Prepandial:   less than 140 mg/dL      Peak postprandial:   less than 180 mg/dL (1-2 hours)      Critically ill patients:  140 - 180 mg/dL  Results for Evan Townsend, Evan SR. (MRN [id number]) as of 09/06/2019 13:18  Ref. Range 09/05/2019 07:52 09/05/2019 09:44 09/05/2019 11:42 09/05/2019 17:27 09/05/2019 21:06 09/06/2019 08:25 09/06/2019 12:20  Glucose-Capillary Latest Ref Range: 70 - 99 mg/dL 144 (H) 818 (H) 563 (H) 212 (H) 190 (H) 188 (H) 141 (H)    Results for Evan Townsend, Evan SR. (MRN [id number]) as of 09/06/2019 13:18  Ref. Range 05/04/2019 14:39 09/06/2019 05:33  Hemoglobin A1C Latest Ref Range: 4.8 - 5.6 % 13.8 (A) 14.3 (H)    Review of Glycemic Control  Diabetes history: DM2 Outpatient Diabetes medications: Glipizide 5 mg QAM, Levemir 30 units daily, Ozempic 0.25 mg Qweek (for 28 days and then increase to 0.5 mg Qweek) Current orders for Inpatient glycemic control: Levemir 30 units daily, Novolog 0-15 units TID with meals, Novolog 0-5 units QHS   Inpatient Diabetes Program Recommendations:  HbgA1C:  A1C 14.3% on 09/06/19 indicating an average glucose of 364 mg/dl over the past 2-3 months.  NOTE: In reviewing chart, noted patient seen E. Annye Asa, FNP on 07/18/19 and per office note patient was told to increase Levemir from 20 to 30 units daily, increase Glipizide to 5 mg daily, and started on Ozempic 0.25 mg Qweek.  Spoke with patient over the phone regarding DM control and A1C. Patient states he is taking Levemir 30 units daily and Glipizide 5 mg QAM as an outpatient for DM. Patient reports that he has not started Ozempic yet (needs to pick up from pharmacy). Patient reports that he is taking DM medications consistently and notes that his wife makes sure he takes them daily as prescribed. Patient states glucose runs fairly well "about like they are here at the hospital".   Discussed A1C of 14.3% on 09/06/19 indicating an average glucose of 364 mg/dl over the past 2-3 months. Discussed A1C and glucose goals. Patient notes glucose is sometimes in 200's but he does not see glucose in 300's at home.   Discussed importance of checking CBGs and maintaining good CBG control to prevent long-term and short-term complications. Explained how hyperglycemia leads to damage within blood vessels which lead to the common complications seen with uncontrolled diabetes. Stressed to the patient the importance of improving glycemic control to prevent further complications from uncontrolled diabetes especially given chest pain and hx of CVA. Encouraged patient to get started on Ozempic as PCP prescribed, to take Levemir and Glipizide as prescribed, check CBGs 3-4 times a day, and follow up with PCP regarding DM control. Patient verbalized understanding of information discussed and reports no further questions at this time related to diabetes.  Thanks, Orlando Penner, RN, MSN, CDE Diabetes Coordinator Inpatient Diabetes Program [phone] (Team Pager from 8am to 5pm)

## 2019-09-06 NOTE — Progress Notes (Signed)
Progress Note  Patient Name: Evan Townsend. Date of Encounter: 09/06/2019  Primary Cardiologist: Nelva Bush, MD  Subjective   Chest pain persists this AM, but is much improved - currently 3/10.  He is NPO and scheduled for stress test this AM.  Inpatient Medications    Scheduled Meds: . amLODipine  10 mg Oral Daily  . aspirin EC  81 mg Oral Daily  . atorvastatin  80 mg Oral QPM  . baclofen  10 mg Oral TID  . Chlorhexidine Gluconate Cloth  6 each Topical Daily  . clopidogrel  75 mg Oral Daily  . enoxaparin (LOVENOX) injection  40 mg Subcutaneous Q24H  . gabapentin  600 mg Oral BID  . insulin aspart  0-15 Units Subcutaneous TID WC  . insulin aspart  0-5 Units Subcutaneous QHS  . insulin detemir  30 Units Subcutaneous Daily  . nitroGLYCERIN  0.5 inch Topical Q6H  . pantoprazole  40 mg Oral Daily  . regadenoson  0.4 mg Intravenous Once  . sodium chloride flush  10 mL Intravenous Q12H   Continuous Infusions:  PRN Meds: acetaminophen, dextrose, ondansetron (ZOFRAN) IV, oxyCODONE   Vital Signs    Vitals:   09/05/19 1300 09/05/19 1523 09/05/19 1929 09/06/19 0416  BP: (!) 145/75 (!) 143/79 (!) 144/85 137/76  Pulse: 74 78 73 61  Resp: 11 15 17 17   Temp:  97.7 F (36.5 C) 98 F (36.7 C) 97.7 F (36.5 C)  TempSrc:    Oral  SpO2: 98% 100% 99% 100%  Weight:  73.1 kg  71.4 kg  Height:  5\' 5"  (1.651 m)      Intake/Output Summary (Last 24 hours) at 09/06/2019 0808 Last data filed at 09/06/2019 0500 Gross per 24 hour  Intake 388.67 ml  Output 650 ml  Net -261.33 ml   Filed Weights   09/04/19 2139 09/05/19 1523 09/06/19 0416  Weight: 77.1 kg 73.1 kg 71.4 kg    Physical Exam   GEN: Well nourished, well developed, in no acute distress.  HEENT: L facial droop. Neck: Supple, no JVD, carotid bruits, or masses. Cardiac: RRR, no murmurs, rubs, or gallops. No clubbing, cyanosis, edema.  Radials/DP/PT 2+ and equal bilaterally.  Respiratory:  Respirations regular  and unlabored, scattered rhonchi. GI: Soft, nontender, nondistended, BS + x 4. MS: no deformity or atrophy. Skin: warm and dry, no rash. Neuro:  L hemiparesis. Psych: AAOx3.  Normal affect.  Labs    Chemistry Recent Labs  Lab 09/05/19 0902 09/05/19 1750 09/06/19 0533  NA 146* 141 142  K 2.9* 4.1 4.9  CL 108 107 108  CO2 25 23 25   GLUCOSE 147* 225* 228*  BUN 24* 23 23  CREATININE 1.24 1.07 1.17  CALCIUM 9.9 9.7 9.4  GFRNONAA >60 >60 >60  GFRAA >60 >60 >60  ANIONGAP 13 11 9      Hematology Recent Labs  Lab 09/04/19 2154  WBC 5.7  RBC 4.17*  HGB 11.9*  HCT 36.1*  MCV 86.6  MCH 28.5  MCHC 33.0  RDW 15.1  PLT 199    Cardiac Enzymes  Recent Labs  Lab 09/04/19 2154 09/04/19 2334 09/05/19 0559  TROPONINIHS 6 5 7       Radiology    DG Chest Port 1 View  Result Date: 09/04/2019 CLINICAL DATA:  Chest pain EXAM: PORTABLE CHEST 1 VIEW COMPARISON:  None. FINDINGS: The heart size and mediastinal contours are within normal limits. Both lungs are clear. The visualized skeletal structures are unremarkable.  IMPRESSION: No active disease. Electronically Signed   By: Jasmine Pang M.D.   On: 09/04/2019 22:12   Telemetry    RSR/Sinus brady - Personally Reviewed  Cardiac Studies   2D Echocardiogram 1.19.21  1. Left ventricular ejection fraction, by visual estimation, is 55 to 60%. The left ventricle has normal function. There is borderline left ventricular hypertrophy.  2. Left ventricular diastolic parameters are consistent with Grade I diastolic dysfunction (impaired relaxation).  3. The left ventricle has no regional wall motion abnormalities.  4. Global right ventricle has low normal systolic function.The right ventricular size is normal. No increase in right ventricular wall thickness.  5. Left atrial size was normal.  6. Right atrial size was normal.  7. The mitral valve is degenerative. Mild mitral valve regurgitation. No evidence of mitral stenosis.  8. The  tricuspid valve is grossly normal.  9. The tricuspid valve is grossly normal. Tricuspid valve regurgitation is not demonstrated. 10. The aortic valve is tricuspid. Aortic valve regurgitation is not visualized. No significant stenosis suspected, though evaluation is limited by lack of spectral Doppler images. 11. The pulmonic valve was not well visualized. Pulmonic valve regurgitation is not visualized. 12. Mildly elevated pulmonary artery systolic pressure. 13. The tricuspid regurgitant velocity is 2.94 m/s, and with an assumed right atrial pressure of 3 mmHg, the estimated right ventricular systolic pressure is mildly elevated at 37.6 mmHg. 14. The inferior vena cava is normal in size with greater than 50% respiratory variability, suggesting right atrial pressure of 3 mmHg. 15. The interatrial septum was not well visualized. _____________    Patient Profile     64 y.o. male  with a history of strokes, L hemiparesis, HTN, HL, poorly controlled DM, cerebrovascular and carotid dzs, and ongoing tob abuse, who was admitted 1/18 w/ atypical chest pain and hyperglycemia (gluc 524), and has since ruled out for MI.  Assessment & Plan    1.  Midsternal and Left chest pain:  Pt w/o prior cardiac hx but w/ multiple RF including prior strokes, mod diff cerebrovascular and RICA dzs, HTN, HL, poorly controlled MD, and tob abuse.  He presented to the ED on 1/18 w/ hyperglycemia (524), nausea, vomiting x 1, and persistent c/p.  ECG non-acute.  Chest pain has persisted since 1/18 in a constant fashion, and is not reproducible w/ palpation, deep breathing, or position changes.  Pain currently 3/10, and overall feeling much better.  Despite prolonged and ongoing Ss, HsTroponins have been normal and echo last night showed nl EF.  Given risk factors, will proceed with stress testing this AM. Provided that this is low risk, he can likely be discharged later today.  Cont asa, statin, and nitropaste for now.  Also on  plavix in setting of strokes.  Cont PPI.  2.  Hyperglycemia/Poorly controlled DMII:  A1c 13.8 in 04/2019.  Gluc 524 on arrival.  Insulin mgmt per IM.  3.  Essential HTN:  Pressures trending 130's to 140's.  Will resume home dose of lisinopril.  4.  HL:  LDL 52 08/2018.  Cont statin rx.  5.  H/o strokes w/ cerebrovascular and carotid dzs:  Cont asa, statin, plavix.  6.  Tob Abuse:  Cessation advised.  Says he's cutting back.  7.  Hypokalemia:  K nl this AM.  8.  Nausea:  Resolved.  Signed, Nicolasa Ducking, NP  09/06/2019, 8:08 AM    For questions or updates, please contact   Please consult [email or web address] for contact info under Cardiology/STEMI.

## 2019-09-06 NOTE — Progress Notes (Signed)
Discharge instructions explained to pt and pts spouse/ verbalized an understanding/ iv and tele removed/ will transport off unit when ride arrives

## 2019-09-06 NOTE — Plan of Care (Signed)
  Problem: Pain Managment: Goal: General experience of comfort will improve Outcome: Not Progressing Note: Patient has been complaining of abdominal pain, scheduled for stress test.

## 2019-09-06 NOTE — Discharge Instructions (Signed)

## 2019-09-07 ENCOUNTER — Telehealth: Payer: Self-pay | Admitting: Emergency Medicine

## 2019-09-07 ENCOUNTER — Telehealth: Payer: Self-pay

## 2019-09-07 DIAGNOSIS — E1165 Type 2 diabetes mellitus with hyperglycemia: Secondary | ICD-10-CM | POA: Diagnosis not present

## 2019-09-07 DIAGNOSIS — G9389 Other specified disorders of brain: Secondary | ICD-10-CM | POA: Diagnosis not present

## 2019-09-07 DIAGNOSIS — R29810 Facial weakness: Secondary | ICD-10-CM | POA: Diagnosis not present

## 2019-09-07 DIAGNOSIS — E119 Type 2 diabetes mellitus without complications: Secondary | ICD-10-CM | POA: Diagnosis not present

## 2019-09-07 DIAGNOSIS — R531 Weakness: Secondary | ICD-10-CM | POA: Diagnosis not present

## 2019-09-07 DIAGNOSIS — Z6825 Body mass index (BMI) 25.0-25.9, adult: Secondary | ICD-10-CM | POA: Diagnosis not present

## 2019-09-07 DIAGNOSIS — I10 Essential (primary) hypertension: Secondary | ICD-10-CM | POA: Diagnosis not present

## 2019-09-07 DIAGNOSIS — R471 Dysarthria and anarthria: Secondary | ICD-10-CM | POA: Diagnosis not present

## 2019-09-07 DIAGNOSIS — R001 Bradycardia, unspecified: Secondary | ICD-10-CM | POA: Diagnosis not present

## 2019-09-07 DIAGNOSIS — M6281 Muscle weakness (generalized): Secondary | ICD-10-CM | POA: Diagnosis not present

## 2019-09-07 DIAGNOSIS — I639 Cerebral infarction, unspecified: Secondary | ICD-10-CM | POA: Diagnosis not present

## 2019-09-07 DIAGNOSIS — I4510 Unspecified right bundle-branch block: Secondary | ICD-10-CM | POA: Diagnosis not present

## 2019-09-07 DIAGNOSIS — R262 Difficulty in walking, not elsewhere classified: Secondary | ICD-10-CM | POA: Diagnosis not present

## 2019-09-07 DIAGNOSIS — E785 Hyperlipidemia, unspecified: Secondary | ICD-10-CM | POA: Diagnosis not present

## 2019-09-07 DIAGNOSIS — R4781 Slurred speech: Secondary | ICD-10-CM | POA: Diagnosis not present

## 2019-09-07 NOTE — Telephone Encounter (Signed)
Spoke to patient's wife Meriam Sprague, patient discharged from [facility] 09/06/19 and per his wife he was still not doing well once they arrived home. She had to get assistance from a neighbor to get him in the house. Pt c/o pain and she reports he wasn't doing well overnight and called EMS around 6:30 am because she was unable to lift him. Pt is now at [facility] ER for evaluation. Hospital follow up not scheduled at this time due to awaiting outcome from [facility].

## 2019-09-07 NOTE — Telephone Encounter (Signed)
See other phone note for today - patient at [facility] ER for care, agree with this disposition.

## 2019-09-07 NOTE — Telephone Encounter (Signed)
Copied from CRM [id number]. Topic: General - Other >> Sep 07, 2019  9:13 AM Daphine Deutscher D wrote: Reason for CRM: Pt's wife Evan Townsend called saying EMS was taking Evan Townsend to [facility] because he had stress yesterday at [facility] and this morning he was very weak and unable to walk.  His blood sugar was 64 so she called the EMS to came out.  They wanted to take him to [facility] but they declined wanting to go to [facility]  This is just an [place].  You can reach his wife at CB# [phone]

## 2019-09-07 NOTE — Telephone Encounter (Signed)
Agree with disposition, documentation reviewed.

## 2019-09-08 ENCOUNTER — Telehealth: Payer: Self-pay | Admitting: Family Medicine

## 2019-09-08 DIAGNOSIS — R471 Dysarthria and anarthria: Secondary | ICD-10-CM | POA: Diagnosis not present

## 2019-09-08 DIAGNOSIS — I69354 Hemiplegia and hemiparesis following cerebral infarction affecting left non-dominant side: Secondary | ICD-10-CM | POA: Diagnosis not present

## 2019-09-08 DIAGNOSIS — E1165 Type 2 diabetes mellitus with hyperglycemia: Secondary | ICD-10-CM | POA: Diagnosis not present

## 2019-09-08 DIAGNOSIS — I517 Cardiomegaly: Secondary | ICD-10-CM | POA: Diagnosis not present

## 2019-09-08 DIAGNOSIS — E119 Type 2 diabetes mellitus without complications: Secondary | ICD-10-CM | POA: Diagnosis not present

## 2019-09-08 DIAGNOSIS — I6350 Cerebral infarction due to unspecified occlusion or stenosis of unspecified cerebral artery: Secondary | ICD-10-CM | POA: Diagnosis not present

## 2019-09-08 DIAGNOSIS — E785 Hyperlipidemia, unspecified: Secondary | ICD-10-CM | POA: Diagnosis not present

## 2019-09-08 DIAGNOSIS — M6281 Muscle weakness (generalized): Secondary | ICD-10-CM | POA: Diagnosis not present

## 2019-09-08 DIAGNOSIS — I639 Cerebral infarction, unspecified: Secondary | ICD-10-CM | POA: Diagnosis not present

## 2019-09-08 DIAGNOSIS — R262 Difficulty in walking, not elsewhere classified: Secondary | ICD-10-CM | POA: Diagnosis not present

## 2019-09-08 DIAGNOSIS — R918 Other nonspecific abnormal finding of lung field: Secondary | ICD-10-CM | POA: Diagnosis not present

## 2019-09-08 DIAGNOSIS — I10 Essential (primary) hypertension: Secondary | ICD-10-CM | POA: Diagnosis not present

## 2019-09-08 NOTE — Telephone Encounter (Signed)
Received request from PT to place order for OT as well for eval and treat.  Order is placed.

## 2019-09-09 DIAGNOSIS — E119 Type 2 diabetes mellitus without complications: Secondary | ICD-10-CM | POA: Diagnosis not present

## 2019-09-09 DIAGNOSIS — I639 Cerebral infarction, unspecified: Secondary | ICD-10-CM | POA: Diagnosis not present

## 2019-09-09 DIAGNOSIS — I10 Essential (primary) hypertension: Secondary | ICD-10-CM | POA: Diagnosis not present

## 2019-09-09 DIAGNOSIS — E1165 Type 2 diabetes mellitus with hyperglycemia: Secondary | ICD-10-CM | POA: Diagnosis not present

## 2019-09-09 NOTE — Discharge Summary (Signed)
[place] at [facility]   PATIENT NAME: Evan Townsend    MR#:  [id number]  DATE OF BIRTH:  Jan 20, 1956  DATE OF ADMISSION:  09/04/2019   ADMITTING PHYSICIAN: Orene Desanctis, DO  DATE OF DISCHARGE: 09/06/2019  3:56 PM  PRIMARY CARE PHYSICIAN: Hubbard Hartshorn, FNP   ADMISSION DIAGNOSIS:  Atypical pneumonia [J18.9] Chest pain [R07.9] Diabetic ketoacidosis without coma associated with type 2 diabetes mellitus ([place]) [E11.10] Chest pain, unspecified type [R07.9] DISCHARGE DIAGNOSIS:  Principal Problem:   Chest pain Active Problems:   Essential hypertension   Diabetes ([place])   Hyperlipidemia   Neuropathic pain   RBBB   Gastroesophageal reflux disease   History of CVA (cerebrovascular accident)   Hyperglycemia   Anemia   AKI (acute kidney injury) ([place])   Atypical pneumonia  SECONDARY DIAGNOSIS:   Past Medical History:  Diagnosis Date  . Carotid arterial disease ([place])    a. 08/2018 Carotid U/S: min-mod RICA atherosclerosis w/o hemodynamically significant stenosis. Nl LICA.  . Diabetes 1.5, managed as type 2 ([place])   . Diastolic dysfunction    a. 08/2018 Echo: EF 65%. No rwma. Gr1 DD. Mild MR.  Marland Kitchen Hypercholesterolemia   . Hypertension   . Left hemiparesis ([place])    a. Ambulated w/ Cane. Limited use of LUE.  . Pain in both feet   . Poorly controlled diabetes mellitus ([place])    a. 04/2019 A1c 13.8.  Marland Kitchen Recurrent strokes ([place])    a. 10/2016 MRI/A: Acute 10m R thalamic infarct, ? subacute infarct of R corona radiata; b. 08/2017 MRI/A: Acute 817mlateral L thalamic infarct. Other more remote lacunar infarcts of thalami bilat. Small vessel dzs; c. 08/2018 MRI/A: Acute lacunar infarct of the post limb of R internal capsule. Underlying advanced chrnoic small vessel dzs w/o hemodynamically significant stenosis.  . Tobacco abuse    HOSPITAL COURSE:  [age].o. male with a history of strokes, L hemiparesis, HTN, HL, poorly controlled DM,cerebrovascular and carotid dzs, and ongoing  tob abuse, who was admitted 1/18 w/ atypical chest pain and hyperglycemia (gluc 524), and has since ruled out for MI.  1.  Midsternal and Left chest pain:  Pt w/o prior cardiac hx but w/ multiple RF including prior strokes, mod diff cerebrovascular and RICA dzs, HTN, HL, poorly controlled MD, and tob abuse.  He presented to the ED on 1/18 w/ hyperglycemia (524), nausea, vomiting x 1, and persistent c/p.  ECG non-acute.  Chest pain has persisted since 1/18 in a constant fashion, and is not reproducible w/ palpation, deep breathing, or position changes.  Patient is overall feeling much better.  Despite prolonged and ongoing Ss, HsTroponins have been normal and echo last night showed nl EF.  Given risk factors, he underwent stress testing on 1/20 which was a low risk study with normal EF. Provided that this is low risk, he is being discharged home.  Cont asa, statin, and nitropaste for now.  Also on plavix in setting of strokes.  Cont PPI.  2.  Hyperglycemia/Poorly controlled DMII:  A1c 13.8 in 04/2019.  Gluc 524 on arrival.  Insulin mgmt per PCP and outpatient.  Consider endocrinology evaluation if felt appropriate by PCP  3.  Essential HTN:  Pressures trending 130's to 140's.  Will resume home dose of lisinopril.  4.  HL:  LDL 52 08/2018.  Cont statin rx.  5.  H/o strokes w/ cerebrovascular and carotid dzs:  Cont asa, statin, plavix.  6.  Tob Abuse:  Cessation advised.  Says he's cutting back.  7.  Hypokalemia:  K nl this AM.  8.  Nausea:  Resolved.   DISCHARGE CONDITIONS:  Stable CONSULTS OBTAINED:  Treatment Team:  Nelva Bush, MD DRUG ALLERGIES:  No Known Allergies DISCHARGE MEDICATIONS:   Allergies as of 09/06/2019   No Known Allergies     Medication List    STOP taking these medications   acetaminophen 325 MG tablet Commonly known as: TYLENOL   aspirin 81 MG EC tablet     TAKE these medications   amLODipine 10 MG tablet Commonly known as: NORVASC Take 1  tablet (10 mg total) by mouth daily.   atorvastatin 80 MG tablet Commonly known as: LIPITOR Take 1 tablet (80 mg total) by mouth every evening.   baclofen 10 MG tablet Commonly known as: LIORESAL TAKE 1 TABLET BY MOUTH 3 TIMES DAILY   clopidogrel 75 MG tablet Commonly known as: PLAVIX Take 1 tablet (75 mg total) by mouth daily.   Fifty50 Glucose Meter 2.0 w/Device Kit Use as instructed   gabapentin 300 MG capsule Commonly known as: NEURONTIN Take 2 capsules (600 mg total) by mouth 2 (two) times daily.   glipiZIDE 5 MG tablet Commonly known as: GLUCOTROL TAKE 1 TABLET BY MOUTH DAILY BEFORE BREAKFAST   hydrochlorothiazide 12.5 MG tablet Commonly known as: HYDRODIURIL Take 1 tablet (12.5 mg total) by mouth every morning.   Insulin Detemir 100 UNIT/ML Pen Commonly known as: LEVEMIR Inject 30 Units into the skin daily.   lisinopril 40 MG tablet Commonly known as: ZESTRIL TAKE 1 TABLET BY MOUTH DAILY   NovoFine 32G X 6 MM Misc Generic drug: Insulin Pen Needle 1 each by Does not apply route once a week.   Ozempic (0.25 or 0.5 MG/DOSE) 2 MG/1.5ML Sopn Generic drug: Semaglutide(0.25 or 0.5MG/DOS) Inject 0.25 mg into the skin once a week for 28 days, THEN 0.5 mg once a week. Start taking on: July 18, 2019   pantoprazole 40 MG tablet Commonly known as: PROTONIX TAKE 1 TABLET BY MOUTH DAILY BEFORE BREAKFAST      DISCHARGE INSTRUCTIONS:   DIET:  Cardiac diet DISCHARGE CONDITION:  Stable ACTIVITY:  Activity as tolerated OXYGEN:  Home Oxygen: No.  Oxygen Delivery: room air DISCHARGE LOCATION:  home   If you experience worsening of your admission symptoms, develop shortness of breath, life threatening emergency, suicidal or homicidal thoughts you must seek medical attention immediately by calling 911 or calling your MD immediately  if symptoms less severe.  You Must read complete instructions/literature along with all the possible adverse reactions/side effects  for all the Medicines you take and that have been prescribed to you. Take any new Medicines after you have completely understood and accpet all the possible adverse reactions/side effects.   Please note  You were cared for by a hospitalist during your hospital stay. If you have any questions about your discharge medications or the care you received while you were in the hospital after you are discharged, you can call the unit and asked to speak with the hospitalist on call if the hospitalist that took care of you is not available. Once you are discharged, your primary care physician will handle any further medical issues. Please note that NO REFILLS for any discharge medications will be authorized once you are discharged, as it is imperative that you return to your primary care physician (or establish a relationship with a primary care physician if you do  not have one) for your aftercare needs so that they can reassess your need for medications and monitor your lab values.    On the day of Discharge:  VITAL SIGNS:  Blood pressure 122/76, pulse 76, temperature 98 F (36.7 C), resp. rate 18, height 5' 5"  (1.651 m), weight 71.4 kg, SpO2 98 %. PHYSICAL EXAMINATION:  GENERAL:  64 y.o.-year-old patient lying in the bed with no acute distress.  EYES: Pupils equal, round, reactive to light and accommodation. No scleral icterus. Extraocular muscles intact.  HEENT: Head atraumatic, normocephalic. Oropharynx and nasopharynx clear.  NECK:  Supple, no jugular venous distention. No thyroid enlargement, no tenderness.  LUNGS: Normal breath sounds bilaterally, no wheezing, rales,rhonchi or crepitation. No use of accessory muscles of respiration.  CARDIOVASCULAR: S1, S2 normal. No murmurs, rubs, or gallops.  ABDOMEN: Soft, non-tender, non-distended. Bowel sounds present. No organomegaly or mass.  EXTREMITIES: No pedal edema, cyanosis, or clubbing.  NEUROLOGIC: Cranial nerves II through XII are intact. Muscle  strength 5/5 in all extremities. Sensation intact. Gait not checked.  PSYCHIATRIC: The patient is alert and oriented x 3.  SKIN: No obvious rash, lesion, or ulcer.  DATA REVIEW:   CBC Recent Labs  Lab 09/04/19 2154  WBC 5.7  HGB 11.9*  HCT 36.1*  PLT 199    Chemistries  Recent Labs  Lab 09/06/19 0533  NA 142  K 4.9  CL 108  CO2 25  GLUCOSE 228*  BUN 23  CREATININE 1.17  CALCIUM 9.4     Follow-up Information    Hubbard Hartshorn, FNP. Schedule an appointment as soon as possible for a visit in 1 week(s).   Specialty: Family Medicine Contact information: [address] [place] [address] [phone]        Nelva Bush, MD. Go on 09/29/2019.   Specialty: Cardiology Why: appointment at [facility] information: [place] [address] [phone]           Management plans discussed with the patient, family and they are in agreement.  CODE STATUS: Prior   TOTAL TIME TAKING CARE OF THIS PATIENT: 45 minutes.    Max Sane M.D on 09/09/2019 at 10:22 AM  [facility]   CC: Primary care physician; Hubbard Hartshorn, FNP   Note: This dictation was prepared with Dragon dictation along with smaller phrase technology. Any transcriptional errors that result from this process are unintentional.

## 2019-09-10 DIAGNOSIS — E785 Hyperlipidemia, unspecified: Secondary | ICD-10-CM | POA: Diagnosis not present

## 2019-09-10 DIAGNOSIS — E119 Type 2 diabetes mellitus without complications: Secondary | ICD-10-CM | POA: Diagnosis not present

## 2019-09-10 DIAGNOSIS — I639 Cerebral infarction, unspecified: Secondary | ICD-10-CM | POA: Diagnosis not present

## 2019-09-10 DIAGNOSIS — I10 Essential (primary) hypertension: Secondary | ICD-10-CM | POA: Diagnosis not present

## 2019-09-11 ENCOUNTER — Ambulatory Visit: Payer: Medicaid Other

## 2019-09-11 DIAGNOSIS — I10 Essential (primary) hypertension: Secondary | ICD-10-CM | POA: Diagnosis not present

## 2019-09-11 DIAGNOSIS — E119 Type 2 diabetes mellitus without complications: Secondary | ICD-10-CM | POA: Diagnosis not present

## 2019-09-11 DIAGNOSIS — E785 Hyperlipidemia, unspecified: Secondary | ICD-10-CM | POA: Diagnosis not present

## 2019-09-11 DIAGNOSIS — I639 Cerebral infarction, unspecified: Secondary | ICD-10-CM | POA: Diagnosis not present

## 2019-09-12 DIAGNOSIS — E119 Type 2 diabetes mellitus without complications: Secondary | ICD-10-CM | POA: Diagnosis not present

## 2019-09-12 DIAGNOSIS — I639 Cerebral infarction, unspecified: Secondary | ICD-10-CM | POA: Diagnosis not present

## 2019-09-12 DIAGNOSIS — I10 Essential (primary) hypertension: Secondary | ICD-10-CM | POA: Diagnosis not present

## 2019-09-12 DIAGNOSIS — E785 Hyperlipidemia, unspecified: Secondary | ICD-10-CM | POA: Diagnosis not present

## 2019-09-12 MED ORDER — DEXTROSE 50 % IV SOLN
12.50 | INTRAVENOUS | Status: DC
Start: ? — End: 2019-09-12

## 2019-09-12 MED ORDER — DICLOFENAC SODIUM 1 % EX GEL
2.00 | CUTANEOUS | Status: DC
Start: ? — End: 2019-09-12

## 2019-09-12 MED ORDER — ASPIRIN 81 MG PO CHEW
81.00 | CHEWABLE_TABLET | ORAL | Status: DC
Start: 2019-09-13 — End: 2019-09-12

## 2019-09-12 MED ORDER — POLYETHYLENE GLYCOL 3350 17 GM/SCOOP PO POWD
17.00 | ORAL | Status: DC
Start: 2019-09-13 — End: 2019-09-12

## 2019-09-12 MED ORDER — INSULIN NPH (HUMAN) (ISOPHANE) 100 UNIT/ML ~~LOC~~ SUSP
6.00 | SUBCUTANEOUS | Status: DC
Start: 2019-09-13 — End: 2019-09-12

## 2019-09-12 MED ORDER — NICOTINE 14 MG/24HR TD PT24
1.00 | MEDICATED_PATCH | TRANSDERMAL | Status: DC
Start: 2019-09-13 — End: 2019-09-12

## 2019-09-12 MED ORDER — ATORVASTATIN CALCIUM 80 MG PO TABS
80.00 | ORAL_TABLET | ORAL | Status: DC
Start: 2019-09-13 — End: 2019-09-12

## 2019-09-12 MED ORDER — GABAPENTIN 300 MG PO CAPS
600.00 | ORAL_CAPSULE | ORAL | Status: DC
Start: 2019-09-12 — End: 2019-09-12

## 2019-09-12 MED ORDER — NICOTINE POLACRILEX 4 MG MT GUM
4.00 | CHEWING_GUM | OROMUCOSAL | Status: DC
Start: ? — End: 2019-09-12

## 2019-09-12 MED ORDER — PANTOPRAZOLE SODIUM 40 MG PO TBEC
40.00 | DELAYED_RELEASE_TABLET | ORAL | Status: DC
Start: 2019-09-13 — End: 2019-09-12

## 2019-09-12 MED ORDER — LISINOPRIL 40 MG PO TABS
40.00 | ORAL_TABLET | ORAL | Status: DC
Start: 2019-09-13 — End: 2019-09-12

## 2019-09-12 MED ORDER — ACETAMINOPHEN 325 MG PO TABS
650.00 | ORAL_TABLET | ORAL | Status: DC
Start: ? — End: 2019-09-12

## 2019-09-12 MED ORDER — NICOTINE POLACRILEX 2 MG MT LOZG
4.00 | LOZENGE | OROMUCOSAL | Status: DC
Start: ? — End: 2019-09-12

## 2019-09-12 MED ORDER — INSULIN REGULAR HUMAN 100 UNIT/ML IJ SOLN
0.00 | INTRAMUSCULAR | Status: DC
Start: 2019-09-12 — End: 2019-09-12

## 2019-09-12 MED ORDER — HEPARIN SODIUM (PORCINE) 5000 UNIT/ML IJ SOLN
5000.00 | INTRAMUSCULAR | Status: DC
Start: 2019-09-12 — End: 2019-09-12

## 2019-09-12 MED ORDER — AMLODIPINE BESYLATE 10 MG PO TABS
10.00 | ORAL_TABLET | ORAL | Status: DC
Start: 2019-09-13 — End: 2019-09-12

## 2019-09-12 MED ORDER — ONDANSETRON 4 MG PO TBDP
4.00 | ORAL_TABLET | ORAL | Status: DC
Start: ? — End: 2019-09-12

## 2019-09-13 ENCOUNTER — Ambulatory Visit: Payer: Medicaid Other | Admitting: Speech Pathology

## 2019-09-13 MED ORDER — MELATONIN 3 MG PO TABS
3.00 | ORAL_TABLET | ORAL | Status: DC
Start: ? — End: 2019-09-13

## 2019-09-13 MED ORDER — GABAPENTIN 300 MG PO CAPS
600.00 | ORAL_CAPSULE | ORAL | Status: DC
Start: 2019-09-23 — End: 2019-09-13

## 2019-09-13 MED ORDER — DICLOFENAC SODIUM 1 % EX GEL
2.00 | CUTANEOUS | Status: DC
Start: ? — End: 2019-09-13

## 2019-09-13 MED ORDER — ASPIRIN 81 MG PO CHEW
81.00 | CHEWABLE_TABLET | ORAL | Status: DC
Start: 2019-09-24 — End: 2019-09-13

## 2019-09-13 MED ORDER — POLYETHYLENE GLYCOL 3350 17 GM/SCOOP PO POWD
17.00 | ORAL | Status: DC
Start: 2019-09-24 — End: 2019-09-13

## 2019-09-13 MED ORDER — BISACODYL 10 MG RE SUPP
10.00 | RECTAL | Status: DC
Start: ? — End: 2019-09-13

## 2019-09-13 MED ORDER — INSULIN REGULAR HUMAN 100 UNIT/ML IJ SOLN
0.00 | INTRAMUSCULAR | Status: DC
Start: 2019-09-23 — End: 2019-09-13

## 2019-09-13 MED ORDER — GUAIFENESIN 100 MG/5ML PO SYRP
100.00 | ORAL_SOLUTION | ORAL | Status: DC
Start: ? — End: 2019-09-13

## 2019-09-13 MED ORDER — PNEUMOCOCCAL VAC POLYVALENT 25 MCG/0.5ML IJ INJ
0.50 | INJECTION | INTRAMUSCULAR | Status: DC
Start: ? — End: 2019-09-13

## 2019-09-13 MED ORDER — ACETAMINOPHEN 325 MG PO TABS
650.00 | ORAL_TABLET | ORAL | Status: DC
Start: ? — End: 2019-09-13

## 2019-09-13 MED ORDER — NICOTINE 14 MG/24HR TD PT24
1.00 | MEDICATED_PATCH | TRANSDERMAL | Status: DC
Start: 2019-09-24 — End: 2019-09-13

## 2019-09-13 MED ORDER — SIMETHICONE 80 MG PO CHEW
80.00 | CHEWABLE_TABLET | ORAL | Status: DC
Start: ? — End: 2019-09-13

## 2019-09-13 MED ORDER — ENOXAPARIN SODIUM 40 MG/0.4ML ~~LOC~~ SOLN
40.00 | SUBCUTANEOUS | Status: DC
Start: 2019-09-16 — End: 2019-09-13

## 2019-09-13 MED ORDER — LISINOPRIL 40 MG PO TABS
40.00 | ORAL_TABLET | ORAL | Status: DC
Start: 2019-09-24 — End: 2019-09-13

## 2019-09-13 MED ORDER — NICOTINE POLACRILEX 4 MG MT GUM
4.00 | CHEWING_GUM | OROMUCOSAL | Status: DC
Start: ? — End: 2019-09-13

## 2019-09-13 MED ORDER — AMLODIPINE BESYLATE 10 MG PO TABS
10.00 | ORAL_TABLET | ORAL | Status: DC
Start: 2019-09-24 — End: 2019-09-13

## 2019-09-13 MED ORDER — PANTOPRAZOLE SODIUM 40 MG PO TBEC
40.00 | DELAYED_RELEASE_TABLET | ORAL | Status: DC
Start: 2019-09-24 — End: 2019-09-13

## 2019-09-13 MED ORDER — ONDANSETRON 4 MG PO TBDP
4.00 | ORAL_TABLET | ORAL | Status: DC
Start: ? — End: 2019-09-13

## 2019-09-13 MED ORDER — ATORVASTATIN CALCIUM 80 MG PO TABS
80.00 | ORAL_TABLET | ORAL | Status: DC
Start: 2019-09-24 — End: 2019-09-13

## 2019-09-13 MED ORDER — CARBOXYMETHYLCELLULOSE SODIUM 0.25 % OP SOLN
2.00 | OPHTHALMIC | Status: DC
Start: ? — End: 2019-09-13

## 2019-09-13 MED ORDER — NICOTINE POLACRILEX 4 MG MT LOZG
4.00 | LOZENGE | OROMUCOSAL | Status: DC
Start: ? — End: 2019-09-13

## 2019-09-13 MED ORDER — DEXTROSE 50 % IV SOLN
12.50 | INTRAVENOUS | Status: DC
Start: ? — End: 2019-09-13

## 2019-09-13 MED ORDER — CALCIUM CARBONATE ANTACID 500 MG PO CHEW
CHEWABLE_TABLET | ORAL | Status: DC
Start: ? — End: 2019-09-13

## 2019-09-13 MED ORDER — DSS 100 MG PO CAPS
100.00 | ORAL_CAPSULE | ORAL | Status: DC
Start: ? — End: 2019-09-13

## 2019-09-13 MED ORDER — INSULIN NPH (HUMAN) (ISOPHANE) 100 UNIT/ML ~~LOC~~ SUSP
6.00 | SUBCUTANEOUS | Status: DC
Start: 2019-09-16 — End: 2019-09-13

## 2019-09-15 MED ORDER — FAMOTIDINE 20 MG PO TABS
20.00 | ORAL_TABLET | ORAL | Status: DC
Start: 2019-09-15 — End: 2019-09-15

## 2019-09-18 ENCOUNTER — Ambulatory Visit: Payer: Medicaid Other | Admitting: Speech Pathology

## 2019-09-18 ENCOUNTER — Ambulatory Visit: Payer: Medicaid Other | Admitting: Physical Therapy

## 2019-09-21 DIAGNOSIS — I6350 Cerebral infarction due to unspecified occlusion or stenosis of unspecified cerebral artery: Secondary | ICD-10-CM | POA: Diagnosis not present

## 2019-09-22 MED ORDER — SENNOSIDES 8.6 MG PO TABS
2.00 | ORAL_TABLET | ORAL | Status: DC
Start: ? — End: 2019-09-22

## 2019-09-22 MED ORDER — GLIPIZIDE 5 MG PO TABS
5.00 | ORAL_TABLET | ORAL | Status: DC
Start: 2019-09-24 — End: 2019-09-22

## 2019-09-22 MED ORDER — ENOXAPARIN SODIUM 40 MG/0.4ML ~~LOC~~ SOLN
40.00 | SUBCUTANEOUS | Status: DC
Start: 2019-09-24 — End: 2019-09-22

## 2019-09-22 MED ORDER — BACLOFEN 10 MG PO TABS
5.00 | ORAL_TABLET | ORAL | Status: DC
Start: 2019-09-23 — End: 2019-09-22

## 2019-09-22 MED ORDER — INSULIN NPH (HUMAN) (ISOPHANE) 100 UNIT/ML ~~LOC~~ SUSP
10.00 | SUBCUTANEOUS | Status: DC
Start: 2019-09-24 — End: 2019-09-22

## 2019-09-27 ENCOUNTER — Encounter: Payer: Self-pay | Admitting: Occupational Therapy

## 2019-09-27 ENCOUNTER — Ambulatory Visit: Payer: Medicaid Other | Admitting: Occupational Therapy

## 2019-09-27 ENCOUNTER — Other Ambulatory Visit: Payer: Self-pay

## 2019-09-27 ENCOUNTER — Encounter: Payer: Medicaid Other | Admitting: Speech Pathology

## 2019-09-27 ENCOUNTER — Ambulatory Visit: Payer: Medicaid Other | Admitting: Physical Therapy

## 2019-09-27 ENCOUNTER — Ambulatory Visit: Payer: Medicaid Other | Attending: *Deleted

## 2019-09-27 DIAGNOSIS — I69354 Hemiplegia and hemiparesis following cerebral infarction affecting left non-dominant side: Secondary | ICD-10-CM | POA: Diagnosis not present

## 2019-09-27 DIAGNOSIS — M6281 Muscle weakness (generalized): Secondary | ICD-10-CM | POA: Diagnosis not present

## 2019-09-27 DIAGNOSIS — R2689 Other abnormalities of gait and mobility: Secondary | ICD-10-CM | POA: Insufficient documentation

## 2019-09-27 DIAGNOSIS — R2681 Unsteadiness on feet: Secondary | ICD-10-CM | POA: Diagnosis not present

## 2019-09-27 DIAGNOSIS — E1165 Type 2 diabetes mellitus with hyperglycemia: Secondary | ICD-10-CM | POA: Diagnosis not present

## 2019-09-27 DIAGNOSIS — R278 Other lack of coordination: Secondary | ICD-10-CM | POA: Diagnosis not present

## 2019-09-27 NOTE — Therapy (Signed)
[place] [facility] MAIN [facility] SERVICES [address] [place], [place], [address] Phone: [phone]   Fax:  [phone]  Occupational Therapy Evaluation  Patient Details  Name: Evan Townsend Sr. MRN: [id number] Date of Birth: 1955/11/02 Referring Provider (OT): Evan Townsend   Encounter Date: 09/27/2019  OT End of Session - 09/27/19 1703    Visit Number  1    Number of Visits  24    Date for OT Re-Evaluation  12/20/19    Authorization Type  Medicaid    OT Start Time  1433    OT Stop Time  1525    OT Time Calculation (min)  52 min    Activity Tolerance  Patient tolerated treatment well    Behavior During Therapy  [facility] for tasks assessed/performed       Past Medical History:  Diagnosis Date  . Carotid arterial disease (HCC)    a. 08/2018 Carotid U/S: min-mod RICA atherosclerosis w/o hemodynamically significant stenosis. Nl LICA.  . Diabetes 1.5, managed as type 2 (HCC)   . Diastolic dysfunction    a. 08/2018 Echo: EF 65%. No rwma. Gr1 DD. Mild MR.  Marland Kitchen Hypercholesterolemia   . Hypertension   . Left hemiparesis (HCC)    a. Ambulated w/ Cane. Limited use of LUE.  . Pain in both feet   . Poorly controlled diabetes mellitus (HCC)    a. 04/2019 A1c 13.8.  Marland Kitchen Recurrent strokes (HCC)    a. 10/2016 MRI/A: Acute 71mm R thalamic infarct, ? subacute infarct of R corona radiata; b. 08/2017 MRI/A: Acute 50mm lateral L thalamic infarct. Other more remote lacunar infarcts of thalami bilat. Small vessel dzs; c. 08/2018 MRI/A: Acute lacunar infarct of the post limb of R internal capsule. Underlying advanced chrnoic small vessel dzs w/o hemodynamically significant stenosis.  . Tobacco abuse     Past Surgical History:  Procedure Laterality Date  . ESOPHAGOGASTRODUODENOSCOPY (EGD) WITH PROPOFOL N/A 04/26/2019   Procedure: ESOPHAGOGASTRODUODENOSCOPY (EGD) WITH PROPOFOL;  Surgeon: Evan Reil, MD;  Location: [facility] ENDOSCOPY;  Service: Gastroenterology;  Laterality:  N/A;    There were no vitals filed for this visit.  Subjective Assessment - 09/27/19 1654    Subjective   Pt. is present with his wife today    Patient is accompanied by:  Family member    Pertinent History  Pt. is a 64 y.o. male with a history of a CVA with left hemiplegia one year ago was admitted to [facility] on 09/04/2019 with Atypical Pneumonia, Diabetic Ketoacidosis without coma. Pt. was then admitted to [facility] received inpatient rehabilitation services. Pt. resides with his wife who assists with all ADLs, and IADLs. Pt. was proviously independent with ADL tasks, worked in Museum/gallery curator, and enjoys fishing.    Limitations  LUE functioning, ADLs, and IADLs    Patient Stated Goals  To be able to put his shirt on by himself    Currently in Pain?  No/denies        [facility] OT Assessment - 09/27/19 1440      Assessment   Medical Diagnosis  Left Hemiplegia    Referring Provider (OT)  Ether Griffins, John    Onset Date/Surgical Date  09/04/19   CVA s/p one year     Precautions   Precautions  Fall      Balance Screen   Has the patient fallen in the past 6 months  No    Has the patient had a decrease in activity level because of  a fear of falling?   Yes    Is the patient reluctant to leave their home because of a fear of falling?   No      Home  Environment   Family/patient expects to be discharged to:  Private residence    Living Arrangements  Spouse/significant other    Type of Home  House    Home Access  Stairs    Home Layout  One level    Bathroom Shower/Tub  Tub/Shower unit;Curtain    Firefighter  Standard    How accessible  Accessible via walker    Home Equipment  [place] - single point;Bedside commode;Shower seat;Grab bars - tub/shower;Hand held shower head    Lives With  Spouse      Prior Function   Level of Independence  Independent    Vocation  On disability    Vocation Requirements  --    [employer]      ADL   Eating/Feeding  Independent   assist  with opening items, cutting food   Grooming  Set up   Uses right hand, has difficulty using the left hand.   Upper Body Bathing  Maximal assistance    Lower Body Bathing  Maximal assistance    Upper Body Dressing  Minimal assistance    Lower Body Dressing  Maximal assistance    Toileting - Clothing Manipulation  Moderate assistance    Tub/Shower Transfer  Modified independent   Uses a shower bench.     IADL   Prior Level of Function Shopping  independent   Independent   Shopping  Completely unable to shop    Prior Level of Function Light Housekeeping  Independent   Independent   Light Housekeeping  Needs help with all home maintenance tasks    Prior Level of Function Meal Prep  Independent    Meal Prep  Able to complete simple warm meal prep    Community Mobility  Relies on family or friends for transportation    Prior Level of Function Medication Managment  Assist required    Medication Management  Is not capable of dispensing or managing own medication    Prior Level of Function Financial Management  --   Wife performs   Financial Management  Dependent      Mobility   Mobility Status  Needs assist      Written Expression   Dominant Hand  Right    Handwriting  50% legible      Vision - History   Baseline Vision  Wears glasses all the time    Patient Visual Report  --   No visual changes     Activity Tolerance   Activity Tolerance  Tolerates 10-20 min activity with multiple rests      Cognition   Overall Cognitive Status  Within Functional Limits for tasks assessed      Coordination   Gross Motor Movements are Fluid and Coordinated  No    Fine Motor Movements are Fluid and Coordinated  No      AROM   Overall AROM Comments  Refer to Eval not form for ROM measurments      Strength   Overall Strength Comments  RUE WNL.      RUE: WNL   Left UE:   Shoulder flexion: flexor synergy pattern compensates with 62 degrees of abduction with active flexion attempts (PROM  85) Abduction: 68(82)  Elbow ROM -47 to127 (-8 to 130)  Wrist extension: 48(65)  Active Digit flexion to [facility]: 2nd: 3cm, 3rd: .5cm  4th: 1cm, 5th: 1cm Digit AROM 2nd Digit MP: 65-85   PIP: 50-75 DIP: 25-35 3rd Digit MP:  65-80  PIP: 50-90 DIP: 15-40 4th Digit MP: 55-80   PIP: 50-95 DIP: 15-25 5th Digit MP:  55-75  PIP: 40-95 DIP: 15-25                        OT Long Term Goals - 09/27/19 1711      OT LONG TERM GOAL #1   Title  Pt. will be independent with donning his shirt using one armed dressing techniques    Baseline  Eval: Pt. requires assist form caregiver    Time  12    Period  Weeks    Status  New    Target Date  12/20/19      OT LONG TERM GOAL #2   Title  Pt. will donn LE dressing with modified independence    Baseline  Eval: MaxA    Time  12    Period  Weeks    Status  New    Target Date  12/20/19      OT LONG TERM GOAL #3   Title  Pt. will improve left digt flexion to be able to hold a toothbrush while applying toothpaste with his right hand.   Baseline  Eval: Pt. is unable    Time  12    Period  Weeks    Status  New    Target Date  12/20/19      OT LONG TERM GOAL #4   Title  Pt. will improve left shoulder flexion,and abduction AROM by 20 degrees to be able to reach his head in preparation for brushing his hair.    Baseline  Eval: Pt. is unable    Time  12    Period  Weeks    Status  New    Target Date  12/20/19      OT LONG TERM GOAL #5   Title  Pt. will be independent with HEP for the LUE, and hand    Baseline  Eval: Pt. does not have a HEP    Time  12    Period  Weeks    Status  New    Target Date  12/20/19            Plan - 09/27/19 1704    Clinical Impression Statement  Pt. is a 64 y.o. male who was diagnosed with a CVA with left Hemiplegia approximately 1 year ago per pt. wife. Pt. presents with left side weakness, limited ROM, flexor synergistic patterns with attempts for active left shoulder movement which limits his  ability to engage his Left UE during ADLs, and IADL tasks. Pt. performs ADLs, and IADLs with his right hand, and is not able to engage his LUE during daily tasks. Pt. will benefit from OT services to work on improving LUE functioning, promote good skin integrity through his left hand, and improve, and maximize independence with self dressing tasks.    OT Occupational Profile and History  Detailed Assessment- Review of Records and additional review of physical, cognitive, psychosocial history related to current functional performance    Occupational performance deficits (Please refer to evaluation for details):  ADL's;IADL's    Body Structure / Function / Physical Skills  ADL;GMC;Strength;IADL;FMC    Clinical Decision Making  Multiple treatment options, significant modification of  task necessary    Modification or Assistance to Complete Evaluation   Max significant modification of tasks or assist is necessary to complete    OT Frequency  2x / week    OT Duration  12 weeks    OT Treatment/Interventions  Self-care/ADL training;Therapeutic exercise;Patient/family education;DME and/or AE instruction;Neuromuscular education;Manual Therapy;Therapeutic activities;Splinting;Moist Heat;Electrical Stimulation    Consulted and Agree with Plan of Care  Patient;Family member/caregiver    Family Member Consulted  wife       Patient will benefit from skilled therapeutic intervention in order to improve the following deficits and impairments:   Body Structure / Function / Physical Skills: ADL, GMC, Strength, IADL, [facility]       Visit Diagnosis: Muscle weakness (generalized)    Problem List Patient Active Problem List   Diagnosis Date Noted  . Chest pain 09/05/2019  . History of CVA (cerebrovascular accident) 09/05/2019  . Hyperglycemia 09/05/2019  . Anemia 09/05/2019  . AKI (acute kidney injury) (HCC) 09/05/2019  . Atypical pneumonia 09/05/2019  . Coagulation disorder (HCC) 06/12/2019  .  Gastroesophageal reflux disease   . Hemiplegia and hemiparesis following cerebral infarction affecting left non-dominant side (HCC) 11/25/2018  . 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 RBBB 10/15/2018  . Sinus bradycardia 10/15/2018  . Dysphagia, post-stroke   . Neuropathic pain   . Lacunar infarct, acute (HCC) 09/06/2018  . TIA (transient ischemic attack) 09/01/2018  . Microalbuminuria 07/29/2018  . Left leg pain 01/13/2018  . Hyperlipidemia 02/11/2017  . Essential hypertension 11/10/2016  . Diabetes (HCC) 11/10/2016  . Tobacco abuse 08/28/2013    Olegario Messier, MS, OTR/L 09/27/2019, 5:23 PM  [place] [facility] MAIN [facility] SERVICES [address] [place], [place], [address] Phone: [phone]   Fax:  [phone]  Name: ZAVEN KLEMENS Sr. MRN: [id number] Date of Birth: 04-02-1956

## 2019-09-27 NOTE — Therapy (Signed)
[facility] MAIN [facility] SERVICES [address] [place], [place], [address] Phone: [phone]   Fax:  [phone]  Physical Therapy Evaluation  Patient Details  Name: Evan ASTARITA Sr. MRN: [id number] Date of Birth: 1956-06-23 Referring Provider (PT): Hope Pigeon   Encounter Date: 09/27/2019  PT End of Session - 09/27/19 1559    Visit Number  1    Number of Visits  8    Date for PT Re-Evaluation  11/22/19    Authorization Type  1/10 eval 2/10    PT Start Time  1350    PT Stop Time  1430    PT Time Calculation (min)  40 min    Equipment Utilized During Treatment  Gait belt;Other (comment)   L swedish knee brace, L AFO   Activity Tolerance  Patient tolerated treatment well    Behavior During Therapy  WFL for tasks assessed/performed       Past Medical History:  Diagnosis Date  . Carotid arterial disease (HCC)    a. 08/2018 Carotid U/S: min-mod RICA atherosclerosis w/o hemodynamically significant stenosis. Nl LICA.  . Diabetes 1.5, managed as type 2 (HCC)   . Diastolic dysfunction    a. 08/2018 Echo: EF 65%. No rwma. Gr1 DD. Mild MR.  Marland Kitchen Hypercholesterolemia   . Hypertension   . Left hemiparesis (HCC)    a. Ambulated w/ Cane. Limited use of LUE.  . Pain in both feet   . Poorly controlled diabetes mellitus (HCC)    a. 04/2019 A1c 13.8.  Marland Kitchen Recurrent strokes (HCC)    a. 10/2016 MRI/A: Acute 80mm R thalamic infarct, ? subacute infarct of R corona radiata; b. 08/2017 MRI/A: Acute 70mm lateral L thalamic infarct. Other more remote lacunar infarcts of thalami bilat. Small vessel dzs; c. 08/2018 MRI/A: Acute lacunar infarct of the post limb of R internal capsule. Underlying advanced chrnoic small vessel dzs w/o hemodynamically significant stenosis.  . Tobacco abuse     Past Surgical History:  Procedure Laterality Date  . ESOPHAGOGASTRODUODENOSCOPY (EGD) WITH PROPOFOL N/A 04/26/2019   Procedure: ESOPHAGOGASTRODUODENOSCOPY (EGD) WITH PROPOFOL;   Surgeon: Toney Reil, MD;  Location: [facility] ENDOSCOPY;  Service: Gastroenterology;  Laterality: N/A;    There were no vitals filed for this visit.   Subjective Assessment - 09/27/19 1400    Subjective  Patient is a pleasant 64 year old male who presents for evaluation s/p L pontine stroke.    Pertinent History  Patient is a pleasant 64 year old male returning to PT (evaluation on 09/04/19) with PMH of HTN, HLD, Type II DM, right basal ganglia stroke (08/2018), acute CVA, sinus bradycardia, RBBB, chronic knee pain, and peripheral neuropathy. Since patient has been seen last he has been admitted to the hospital three times and went to [facility] acute inpatient rehab for left paramedian pontine and right cerebellar ischemic stroke (09/07/19) discharged 09/23/19.  Per CIR documentation patient utilizes AFO and Swedish knee cage on LLE to prevent genu recurvatum. Patient was initially evaluated prior to recent hospitalization for L hemiplegia, went to inpatient rehab in 2020 for first CVA. Has an aide at home    Limitations  Lifting;Standing;Walking;House hold activities    How long can you sit comfortably?  n/a    How long can you stand comfortably?  needs to hold onto objects    How long can you walk comfortably?  needs AD and CGA    Patient Stated Goals  to get back to fishing, walking better,  balance.    Currently in Pain?  No/denies         [facility] PT Assessment - 09/27/19 0001      Assessment   Medical Diagnosis  L hemiparesis    Referring Provider (PT)  Ether Griffins, John    Hand Dominance  Right      Home Environment   Living Environment  Private residence    Living Arrangements  Spouse/significant other    Available Help at Discharge  Family    Type of Home  House    Home Access  Stairs to enter;Ramped entrance   ramp in backyard   Entrance Ep[place] of Steps  4      Prior Function   Leisure  fishing, grilling      Standardized Balance Assessment   Standardized Balance Assessment   Berg Balance Test      Berg Balance Test   Sit to Stand  Able to stand  independently using hands    Standing Unsupported  Able to stand 30 seconds unsupported    Sitting with Back Unsupported but Feet Supported on Floor or Stool  Able to sit safely and securely 2 minutes    Stand to Sit  Controls descent by using hands    Transfers  Able to transfer safely, definite need of hands    Standing Unsupported with Eyes Closed  Able to stand 10 seconds with supervision    Standing Unsupported with Feet Together  Able to place feet together independently but unable to hold for 30 seconds    From Standing, Reach Forward with Outstretched Arm  Can reach forward >5 cm safely (2")    From Standing Position, Pick up Object from Floor  Unable to try/needs assist to keep balance    From Standing Position, Turn to Look Behind Over each Shoulder  Turn sideways only but maintains balance    Turn 360 Degrees  Needs assistance while turning    Standing Unsupported, Alternately Place Feet on Step/Stool  Needs assistance to keep from falling or unable to try    Standing Unsupported, One Foot in [employer] balance while stepping or standing    Standing on One Leg  Unable to try or needs assist to prevent fall    Total Score  24        PAIN: Hand pain: 8/10 burning at worst, reports no pain at this moment   POSTURE: L arm flexion synergy pattern with limited spatial awareness of LLE. Marland Kitchen Preference for R sided gaze and trunk rotation   Noted gluteal atrophy of LLE  PROM/AROM: Contractures noted in LUE: OT to measure LLE: hyperextension in weightbearing   STRENGTH:  Graded on a 0-5 scale Muscle Group Left Right  Hip Flex 3-/5 4/5  Hip Abd 2+/5 4/5  Hip Add 2/5 4/5  Hip Ext Not able to measure Not able to measure  Hip IR/ER    Knee Flex 2-/5 4/5  Knee Ext 2+/5 4/5   Ankle DF 2-/5 4-/5  Ankle PF 2-/5 4-/5   SENSATION: Patient unable to cognitively differentiate between LE's.   SPECIAL  TESTS: Unable to test coordination due to limited mobility/cognition  Visual field: slight visual loss of upper left quarter.   FUNCTIONAL MOBILITY: STS: push with RUE: primarily use of RLE only :   Transfer chair to chair:  to Left more unsteady, transfer to right safer with decreased instability   BALANCE: Dynamic Sitting Balance  Normal Able to sit unsupported and weight  shift across midline maximally   Good Able to sit unsupported and weight shift across midline moderately   Good-/Fair+ Able to sit unsupported and weight shift across midline minimally   Fair Minimal weight shifting ipsilateral/front, difficulty crossing midline   Fair- Reach to ipsilateral side and unable to weight shift x  Poor + Able to sit unsupported with min A and reach to ipsilateral side, unable to weight shift   Poor Able to sit unsupported with mod A and reach ipsilateral/front-can't cross midline     Standing Dynamic Balance  Normal Stand independently unsupported, able to weight shift and cross midline maximally   Good Stand independently unsupported, able to weight shift and cross midline moderately   Good-/Fair+ Stand independently unsupported, able to weight shift across midline minimally   Fair Stand independently unsupported, weight shift, and reach ipsilaterally, loss of balance when crossing midline   Poor+ Able to stand with Min A and reach ipsilaterally, unable to weight shift x  Poor Able to stand with Mod A and minimally reach ipsilaterally, unable to cross midline.     Static Sitting Balance  Normal Able to maintain balance against maximal resistance   Good Able to maintain balance against moderate resistance   Good-/Fair+ Accepts minimal resistance   Fair Able to sit unsupported without balance loss and without UE support x  Poor+ Able to maintain with Minimal assistance from individual or chair   Poor Unable to maintain balance-requires mod/max support from individual or chair      Static Standing Balance  Normal Able to maintain standing balance against maximal resistance   Good Able to maintain standing balance against moderate resistance   Good-/Fair+ Able to maintain standing balance against minimal resistance   Fair Able to stand unsupported without UE support and without LOB for 1-2 min   Fair- Requires Min A and UE support to maintain standing without loss of balance x  Poor+ Requires mod A and UE support to maintain standing without loss of balance   Poor Requires max A and UE support to maintain standing balance without loss       GAIT:  Patient ambulated with use of hurrycane with w/c follow and close CGA. Despite use of Swedish knee brace and L AFO patient demonstrates knee hyperextension of LLE. L arm flexion synergy pattern, Limited ankle righting reactions noted bilaterally with ataxic gait pattern.   OUTCOME MEASURES: TEST Outcome Interpretation  5 times sit<>stand 17 sec RUE support with primary use of RLE >8 yo, >15 sec indicates increased risk for falls  10 meter walk test     32 seconds with hurrycane  =0.31           m/s <1.0 m/s indicates increased risk for falls; limited community ambulator  Berg Balance Assessment 24/56: unable to perform some tasks due limited time and stability/availability of // bars  <36/56 (100% risk for falls), 37-45 (80% risk for falls); 46-51 (>50% risk for falls); 52-55 (lower risk <25% of falls)            Objective measurements completed on examination: See above findings.              PT Education - 09/27/19 1558    Education Details  goals, POC,    Person(s) Educated  Patient;Spouse    Methods  Explanation;Demonstration;Tactile cues;Verbal cues    Comprehension  Verbalized understanding;Returned demonstration;Verbal cues required;Tactile cues required       PT Short Term Goals - 09/27/19 1653  PT SHORT TERM GOAL #1   Title  Patient will be independent with HEP for improved  therapeutic gains and self-management.    Baseline  2/10: give next session    Time  4    Period  Weeks    Status  New    Target Date  10/25/19        PT Long Term Goals - 09/27/19 1653      PT LONG TERM GOAL #1   Title  Patient will increase Berg Balance score by > 6 points (30/56) to demonstrate decreased fall risk during functional activities.    Baseline  2/10: 24/56    Time  8    Period  Weeks    Status  New    Target Date  11/22/19      PT LONG TERM GOAL #2   Title  Patient will increase 10 meter walk test to >1.51m/s as to improve gait speed for better community ambulation and to reduce fall risk.    Baseline  2/10: 0.31 m/s with hurrycane and w/c follow    Time  8    Period  Weeks    Status  New    Target Date  11/22/19      PT LONG TERM GOAL #3   Title  Patient (> 28 years old) will complete five times sit to stand test in < 15 seconds with no UE support indicating an increased LE strength and improved balance.    Baseline  2/10: 17 seconds heavy RUE support and use only of RLE    Time  8    Period  Weeks    Status  New    Target Date  11/22/19      PT LONG TERM GOAL #4   Title  Patient will increase BLE gross strength to 4+/5 as to improve functional strength for independent gait, increased standing tolerance and increased ADL ability.    Baseline  2/10: see evaluation note    Time  8    Period  Weeks    Status  New    Target Date  11/22/19             Plan - 09/27/19 1638    Clinical Impression Statement  Patient is a pleasant 64 year old male who presents to physical therapy after recent hospitalization and CIR s/p new CVA /L pontine stroke. Compared to previous outcome measures prior to new changes of medical history patient has a decreased 10 MWT speed and decreased balance with a drop in his BERG score. Patient is limited in stability on LLE with noted hyperextension despite bracing and AFO. He requires use of UE support for transfers and ambulation.  He demonstrates excellent motivation despite cognitive deficits and is eager to perform evaluation. Patient demonstrates flexor synergy patterning with exertion resulting in altered COM and instability in standing and dynamic motion. Patient is additionally limited with LLE coordination and spatial awareness resulting in altered gait pattern. Patient will benefit from skilled physical therapy to address LLE coordination, strength, and stability to optimize independence, increase mobility, and quality of life.    Personal Factors and Comorbidities  Age;Education;Sex;Social Background;Behavior Pattern;Finances;Past/Current Experience;Time since onset of injury/illness/exacerbation;Comorbidity 3+;Transportation    Comorbidities  PMH of HTN, HLD, Type II DM, right basal ganglia stroke (08/2018), acute CVA, sinus bradycardia, RBBB, chronic knee pain, and peripheral neuropathy    Examination-Activity Limitations  Bathing;Bed Mobility;Dressing;Transfers;Squat;Lift;Locomotion Level;Stairs;Reach Overhead;Carry;Stand;Sit    Examination-Participation Restrictions  Interpersonal Relationship;Yard Work;Laundry;Cleaning;Community Activity;Meal  Prep;Medication Management;Driving;Shop;Volunteer    Stability/Clinical Decision Making  Unstable/Unpredictable    Clinical Decision Making  High    Rehab Potential  Fair    PT Frequency  1x / week    PT Duration  8 weeks    PT Treatment/Interventions  ADLs/Self Care Home Management;Aquatic Therapy;Moist Heat;Cryotherapy;Gait training;Stair training;Functional mobility training;Therapeutic activities;Therapeutic exercise;Patient/family education;Neuromuscular re-education;Balance training;Orthotic Fit/Training;Manual techniques;Passive range of motion;Dry needling;Taping;Energy conservation;Splinting;Joint Manipulations;Spinal Manipulations;Electrical Stimulation;Ultrasound;Vestibular;Biofeedback;Iontophoresis 4mg /ml Dexamethasone;DME Instruction;Visual/perceptual  remediation/compensation    PT Next Visit Plan  HEP, // bars, safety awareness    PT Home Exercise Plan  give next one    Consulted and Agree with Plan of Care  Patient;Family member/caregiver    Family Member Consulted  wife       Patient will benefit from skilled therapeutic intervention in order to improve the following deficits and impairments:  Abnormal gait, Decreased balance, Decreased endurance, Decreased mobility, Difficulty walking, Hypomobility, Increased muscle spasms, Impaired tone, Decreased range of motion, Decreased coordination, Decreased strength, Impaired UE functional use, Pain, Decreased activity tolerance, Decreased safety awareness, Impaired perceived functional ability, Impaired sensation, Improper body mechanics, Postural dysfunction, Decreased knowledge of use of DME, Decreased knowledge of precautions, Impaired flexibility, Impaired vision/preception  Visit Diagnosis: Unsteadiness on feet  Other abnormalities of gait and mobility  Muscle weakness (generalized)  Hemiplegia and hemiparesis following cerebral infarction affecting left non-dominant side [facility])     Problem List Patient Active Problem List   Diagnosis Date Noted  . Chest pain 09/05/2019  . History of CVA (cerebrovascular accident) 09/05/2019  . Hyperglycemia 09/05/2019  . Anemia 09/05/2019  . AKI (acute kidney injury) (HCC) 09/05/2019  . Atypical pneumonia 09/05/2019  . Coagulation disorder (HCC) 06/12/2019  . Gastroesophageal reflux disease   . Hemiplegia and hemiparesis following cerebral infarction affecting left non-dominant side (HCC) 11/25/2018  . 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 RBBB 10/15/2018  . Sinus bradycardia 10/15/2018  . Dysphagia, post-stroke   . Neuropathic pain   . Lacunar infarct, acute (HCC) 09/06/2018  . TIA (transient ischemic attack) 09/01/2018  . Microalbuminuria 07/29/2018  . Left leg pain 01/13/2018  . Hyperlipidemia 02/11/2017  . Essential hypertension 11/10/2016   . Diabetes (HCC) 11/10/2016  . Tobacco abuse 08/28/2013   10/26/2013, PT, DPT   09/27/2019, 4:57 PM  [facility] MAIN [facility] SERVICES [address] [place], [place], [place] Phone: [phone]   Fax:  [phone]  Name: GAVINO FOUCH Sr. MRN: Sandie Ano Date of Birth: 1956/06/18

## 2019-09-28 DIAGNOSIS — E1165 Type 2 diabetes mellitus with hyperglycemia: Secondary | ICD-10-CM | POA: Diagnosis not present

## 2019-09-29 ENCOUNTER — Ambulatory Visit (INDEPENDENT_AMBULATORY_CARE_PROVIDER_SITE_OTHER): Payer: Medicaid Other

## 2019-09-29 ENCOUNTER — Ambulatory Visit (INDEPENDENT_AMBULATORY_CARE_PROVIDER_SITE_OTHER): Payer: Medicaid Other | Admitting: Internal Medicine

## 2019-09-29 ENCOUNTER — Encounter: Payer: Self-pay | Admitting: Internal Medicine

## 2019-09-29 ENCOUNTER — Other Ambulatory Visit: Payer: Self-pay

## 2019-09-29 VITALS — BP 140/88 | HR 54 | Ht 66.0 in | Wt 167.0 lb

## 2019-09-29 DIAGNOSIS — I10 Essential (primary) hypertension: Secondary | ICD-10-CM | POA: Diagnosis not present

## 2019-09-29 DIAGNOSIS — Z79899 Other long term (current) drug therapy: Secondary | ICD-10-CM

## 2019-09-29 DIAGNOSIS — R079 Chest pain, unspecified: Secondary | ICD-10-CM

## 2019-09-29 DIAGNOSIS — I639 Cerebral infarction, unspecified: Secondary | ICD-10-CM | POA: Diagnosis not present

## 2019-09-29 DIAGNOSIS — E1165 Type 2 diabetes mellitus with hyperglycemia: Secondary | ICD-10-CM | POA: Diagnosis not present

## 2019-09-29 MED ORDER — HYDROCHLOROTHIAZIDE 25 MG PO TABS: 25.0000 mg | ORAL_TABLET | ORAL | 3 refills | Status: DC

## 2019-09-29 NOTE — Progress Notes (Signed)
Follow-up Outpatient Visit Date: 09/29/2019  Primary Care Provider: Hubbard Hartshorn, [place] [address]  Chief Complaint: Follow-up chest pain and recent stroke  HPI:  [name] is a 64 y.o. male with history of multiple strokes with left hemiparesis, hypertension, hyperlipidemia, uncontrolled diabetes mellitus, and tobacco use, who presents for follow-up of chest pain.  He was admitted at [facility] last month with chest pain in the setting of severely elevated blood sugar requiring insulin infusion.  Stress test was low risk without ischemia, with some diaphragmatic attenuation noted.  LVEF was normal by echo.  Medical therapy was recommended.  He presented to [facility] ED the day after discharge with dysarthria, left facial droop, and worsening left-sided weakness.  He states that he never felt quite right after undergoing stress test prior to discharge from [facility].  Brain MRI showed evidence of acute stroke in the left paramedian pons and right cerebellar hemisphere.  He was transferred to acute inpatient rehab and discharged home on 09/23/2019.  Today, [name] reports that he feels better but is not all the way back to baseline.  He has chronic left-sided weakness following his strokes.  He has not had any further chest pain since being hospitalized at [facility], though he complains of acid reflux and regurgitation.  He also denies shortness of breath and palpitations.  He reports being compliant with his medications, though his blood sugars have remained somewhat labile.  He has chronic lower extremity edema, left greater than right, which improves when he elevates his legs regularly.  He has not been taking baclofen since returning home, as he states it was making him feel dizzy.  [name] denies ever having undergone cardiac event monitoring or TEE for further work-up of his recurrent strokes.  Given complaints of acid reflux and regurgitation, GI work-up may be needed to  ensure that it is safe to proceed with TEE.  --------------------------------------------------------------------------------------------------  Past Medical History:  Diagnosis Date  . Carotid arterial disease ([place])    a. 08/2018 Carotid U/S: min-mod RICA atherosclerosis w/o hemodynamically significant stenosis. Nl LICA.  . Diabetes 1.5, managed as type 2 ([place])   . Diastolic dysfunction    a. 08/2018 Echo: EF 65%. No rwma. Gr1 DD. Mild MR.  Marland Kitchen Hypercholesterolemia   . Hypertension   . Left hemiparesis ([place])    a. Ambulated w/ Cane. Limited use of LUE.  . Pain in both feet   . Poorly controlled diabetes mellitus ([place])    a. 04/2019 A1c 13.8.  Marland Kitchen Recurrent strokes ([place])    a. 10/2016 MRI/A: Acute 51m R thalamic infarct, ? subacute infarct of R corona radiata; b. 08/2017 MRI/A: Acute 829mlateral L thalamic infarct. Other more remote lacunar infarcts of thalami bilat. Small vessel dzs; c. 08/2018 MRI/A: Acute lacunar infarct of the post limb of R internal capsule. Underlying advanced chrnoic small vessel dzs w/o hemodynamically significant stenosis.  . Tobacco abuse    Past Surgical History:  Procedure Laterality Date  . ESOPHAGOGASTRODUODENOSCOPY (EGD) WITH PROPOFOL N/A 04/26/2019   Procedure: ESOPHAGOGASTRODUODENOSCOPY (EGD) WITH PROPOFOL;  Surgeon: VaLin LandsmanMD;  [place] Service: Gastroenterology;  Laterality: N/A;    Current Meds  Medication Sig  . amLODipine (NORVASC) 10 MG tablet Take 1 tablet (10 mg total) by mouth daily.  . Marland Kitchentorvastatin (LIPITOR) 80 MG tablet Take 1 tablet (80 mg total) by mouth every evening.  . baclofen (LIORESAL) 10 MG tablet TAKE 1 TABLET BY  MOUTH 3 TIMES DAILY  . Blood Glucose Monitoring Suppl (FIFTY50 GLUCOSE METER 2.0) w/Device KIT Use as instructed  . clopidogrel (PLAVIX) 75 MG tablet Take 1 tablet (75 mg total) by mouth daily.  Marland Kitchen gabapentin (NEURONTIN) 300 MG capsule Take 2 capsules (600 mg total) by mouth 2 (two) times daily.  Marland Kitchen  glipiZIDE (GLUCOTROL) 5 MG tablet TAKE 1 TABLET BY MOUTH DAILY BEFORE BREAKFAST  . hydrochlorothiazide (HYDRODIURIL) 25 MG tablet Take 1 tablet (25 mg total) by mouth every morning.  . insulin detemir (LEVEMIR) 100 UNIT/ML injection Inject 10 Units into the skin daily.  . Insulin Pen Needle (NOVOFINE) 32G X 6 MM MISC 1 each by Does not apply route once a week.  Marland Kitchen lisinopril (ZESTRIL) 40 MG tablet TAKE 1 TABLET BY MOUTH DAILY  . pantoprazole (PROTONIX) 40 MG tablet TAKE 1 TABLET BY MOUTH DAILY BEFORE BREAKFAST  . [DISCONTINUED] hydrochlorothiazide (HYDRODIURIL) 12.5 MG tablet Take 1 tablet (12.5 mg total) by mouth every morning.    Allergies: Patient has no known allergies.  Social History   Tobacco Use  . Smoking status: Current Every Day Smoker    Packs/day: 1.00    Years: 38.00    Pack years: 38.00    Types: Cigarettes    Start date: 84  . Smokeless tobacco: Never Used  Substance Use Topics  . Alcohol use: Not Currently    Alcohol/week: 1.0 standard drinks    Types: 1 Cans of beer per week    Comment: previously drank but nothing in 1-2 yrs (08/2019).  . Drug use: Not Currently    Types: Cocaine, Marijuana    Comment: prev used cocaine/marijuana but none x 1-2 yrs (08/2019).    Family History  Problem Relation Age of Onset  . Hypertension Mother   . Stroke Mother        died @ age 18  . Hypertension Father   . Diabetes Father   . Heart attack Father        died @ 44    Review of Systems: A 12-system review of systems was performed and was negative except as noted in the HPI.  --------------------------------------------------------------------------------------------------  Physical Exam: BP 140/88 (BP Location: Right Arm, Patient Position: Sitting, Cuff Size: Normal)   Pulse (!) 54   Ht 5' 6"  (1.676 m)   Wt 167 lb (75.8 kg)   SpO2 98%   BMI 26.95 kg/m   General: NAD.  Seated in a wheelchair and accompanied by his wife. Neck: No JVD or HJR. Lungs: Normal  work of breathing. Clear to auscultation bilaterally without wheezes or crackles. Heart: Regular rate and rhythm without murmurs, rubs, or gallops. Abd: Bowel sounds present. Soft, NT/ND without hepatosplenomegaly Ext: Trace left lower extremity edema. Neuro: Slightly slurred speech noted.  Left upper and lower extremity weakness present.  EKG: Sinus bradycardia with sinus arrhythmia and right bundle branch block.  Lab Results  Component Value Date   WBC 5.7 09/04/2019   HGB 11.9 (L) 09/04/2019   HCT 36.1 (L) 09/04/2019   MCV 86.6 09/04/2019   PLT 199 09/04/2019    Lab Results  Component Value Date   NA 142 09/06/2019   K 4.9 09/06/2019   CL 108 09/06/2019   CO2 25 09/06/2019   BUN 23 09/06/2019   CREATININE 1.17 09/06/2019   GLUCOSE 228 (H) 09/06/2019   ALT 46 (H) 02/15/2019    Lab Results  Component Value Date   CHOL 131 01/31/2019   HDL 47 01/31/2019  [place] 54 01/31/2019   TRIG 239 (H) 01/31/2019   CHOLHDL 2.8 01/31/2019    --------------------------------------------------------------------------------------------------  ASSESSMENT AND PLAN: Recurrent strokes: Etiology of recurrent strokes remains uncertain, with prior carotid imaging not showing high-grade disease.  We have discussed potential cardioembolic causes for stroke and have agreed to begin with a 30-day event monitor.  If this is unrevealing, we may need to consider TEE +/- loop recorder placement.  Importance of blood pressure and glycemic control were reinforced.  Continue clopidogrel for antiplatelet therapy.  Smoking cessation was encouraged.  Chest pain: No further episodes since hospitalization at [facility] last month.  Myoview at that time was low risk.  No further work-up at this time.  Hypertension: Blood pressure mildly elevated (goal less than 130/80).  We will increase HCTZ to 25 mg daily.  Mr. Denomme should continue current doses of amlodipine and lisinopril.  We will plan for a BMP in 1  month.  Hyperlipidemia: Continue atorvastatin 80 mg daily for secondary prevention of cerebrovascular disease.  Follow-up: Return to clinic in 2 months.  Nelva Bush, MD 09/29/2019 8:09 PM

## 2019-09-29 NOTE — Patient Instructions (Signed)
Medication Instructions:  Your physician has recommended you make the following change in your medication:  1- INCREASE Hydrochlorothiazide to 25 mg by mouth once a day.  *If you need a refill on your cardiac medications before your next appointment, please call your pharmacy*  Lab Work: Your physician recommends that you return for lab work in: 1 month around October 27, 2019.  BMET - Please go to the [facility]. You will check in at the front desk to the right as you walk into the atrium. Valet Parking is offered if needed. - No appointment needed. You may go any day between 7 am and 6 pm.   If you have labs (blood work) drawn today and your tests are completely normal, you will receive your results only by: Marland Kitchen MyChart Message (if you have MyChart) OR . A paper copy in the mail If you have any lab test that is abnormal or we need to change your treatment, we will call you to review the results.  Testing/Procedures: Wear ZIO monitor for 28 days -You will wear 2 monitors. The second one will be mailed to you.   Your physician has recommended that you wear a Zio monitor. This monitor is a medical device that records the heart's electrical activity. Doctors most often use these monitors to diagnose arrhythmias. Arrhythmias are problems with the speed or rhythm of the heartbeat. The monitor is a small device applied to your chest. You can wear one while you do your normal daily activities. While wearing this monitor if you have any symptoms to push the button and record what you felt. Once you have worn this monitor for the period of time provider prescribed (Usually 14 days), you will return the monitor device in the postage paid box. Once it is returned they will download the data collected and provide [place] with a report which the provider will then review and we will call you with those results. Important tips:  1. Avoid showering during the first 24 hours of wearing the monitor. 2. Avoid  excessive sweating to help maximize wear time. 3. Do not submerge the device, no hot tubs, and no swimming pools. 4. Keep any lotions or oils away from the patch. 5. After 24 hours you may shower with the patch on. Take brief showers with your back facing the shower head.  6. Do not remove patch once it has been placed because that will interrupt data and decrease adhesive wear time. 7. Push the button when you have any symptoms and write down what you were feeling. 8. Once you have completed wearing your monitor, remove and place into box which has postage paid and place in your outgoing mailbox.  9. If for some reason you have misplaced your box then call our office and we can provide another box and/or mail it off for you.   Follow-Up: At [facility], you and your health needs are our priority.  As part of our continuing mission to provide you with exceptional heart care, we have created designated Provider Care Teams.  These Care Teams include your primary Cardiologist (physician) and Advanced Practice Providers (APPs -  Physician Assistants and Nurse Practitioners) who all work together to provide you with the care you need, when you need it.  Your next appointment:   2 month(s)  The format for your next appointment:   In Person  Provider:    You may see [name], MD or one of the following Advanced Practice  Providers on your designated Care Team:    Murray Hodgkins, NP  Christell Faith, PA-C  Marrianne Mood, [place]

## 2019-09-30 DIAGNOSIS — E1165 Type 2 diabetes mellitus with hyperglycemia: Secondary | ICD-10-CM | POA: Diagnosis not present

## 2019-10-02 ENCOUNTER — Ambulatory Visit (INDEPENDENT_AMBULATORY_CARE_PROVIDER_SITE_OTHER): Payer: Medicaid Other | Admitting: Internal Medicine

## 2019-10-02 ENCOUNTER — Other Ambulatory Visit: Payer: Self-pay

## 2019-10-02 ENCOUNTER — Encounter: Payer: Medicaid Other | Admitting: Speech Pathology

## 2019-10-02 ENCOUNTER — Encounter: Payer: Medicaid Other | Admitting: Occupational Therapy

## 2019-10-02 ENCOUNTER — Ambulatory Visit: Payer: Medicaid Other | Admitting: Physical Therapy

## 2019-10-02 ENCOUNTER — Ambulatory Visit: Payer: Medicaid Other | Admitting: Speech Pathology

## 2019-10-02 ENCOUNTER — Encounter: Payer: Self-pay | Admitting: Internal Medicine

## 2019-10-02 VITALS — BP 92/62 | Temp 96.2°F | Resp 20 | Ht 66.0 in | Wt 165.8 lb

## 2019-10-02 DIAGNOSIS — I639 Cerebral infarction, unspecified: Secondary | ICD-10-CM

## 2019-10-02 DIAGNOSIS — Z794 Long term (current) use of insulin: Secondary | ICD-10-CM

## 2019-10-02 DIAGNOSIS — K219 Gastro-esophageal reflux disease without esophagitis: Secondary | ICD-10-CM

## 2019-10-02 DIAGNOSIS — I10 Essential (primary) hypertension: Secondary | ICD-10-CM | POA: Diagnosis not present

## 2019-10-02 DIAGNOSIS — E1159 Type 2 diabetes mellitus with other circulatory complications: Secondary | ICD-10-CM

## 2019-10-02 DIAGNOSIS — E782 Mixed hyperlipidemia: Secondary | ICD-10-CM | POA: Diagnosis not present

## 2019-10-02 DIAGNOSIS — Z720 Tobacco use: Secondary | ICD-10-CM

## 2019-10-02 DIAGNOSIS — I69354 Hemiplegia and hemiparesis following cerebral infarction affecting left non-dominant side: Secondary | ICD-10-CM | POA: Diagnosis not present

## 2019-10-02 MED ORDER — PANTOPRAZOLE SODIUM 40 MG PO TBEC: DELAYED_RELEASE_TABLET | ORAL | 2 refills | Status: DC

## 2019-10-02 NOTE — Patient Instructions (Addendum)
Referral provided for endocrinology

## 2019-10-02 NOTE — Progress Notes (Signed)
Patient ID: LIBERTY STEAD Sr., male    DOB: 04/20/1956, 64 y.o.   MRN: [id number]  PCP: Hubbard Hartshorn, FNP  Chief Complaint  Patient presents with  . Hospitalization Follow-up    Stroke    Subjective:   ILIA ENGELBERT Sr. is a 64 y.o. male, presents to clinic with CC of the following:  Chief Complaint  Patient presents with  . Hospitalization Follow-up    Stroke    HPI:  Mr. Buchberger is a 64 y.o. male with history of multiple strokes with left hemiparesis, hypertension, hyperlipidemia, uncontrolled diabetes mellitus, and ongoing tobacco use, who presents for follow-up.  He was discharged from the inpatient rehab unit a week prior to this past Saturday and has been home and his wife was with him today.   He was admitted at [facility] last month with chest pain in the setting of severely elevated blood sugar requiring insulin infusion.  Stress test was low risk without ischemia, with some diaphragmatic attenuation noted.  LVEF was normal by echo.  Medical therapy was recommended.    He presented to [facility] ED the day after discharge with dysarthria, left facial droop, and worsening left-sided weakness.  MRI showed evidence of acute stroke in the left paramedian pons and right cerebellar hemisphere.  He was transferred to acute inpatient rehab and discharged home on 09/23/2019.  On recent f/u with cardiology on 09/29/2019 had his HCTZ increased to 25 mg once daily.  He also is currently wearing a heart monitor, and will be wearing 1 for 1 months time to help assess.    Since discharge, he has been attending rehabilitation 2 times a week, and states it is helpful.  He is limited with his verbal responses, often one-word or 2 words, with his wife helping a lot with some of the history.  He notes he has been having worsening indigestion complaints, with his Protonix having run out and he has not been able to take recently.  He also gets dizzy after taking the baclofen, and has not been using that.   He denies any loss of control of bowel or bladder.   He notes occasional pain in his left shoulder and left knee, with his left side affected after the CVA, and has some numbness at times in his fingers and feet on the left side.  No chest pains or shortness of breath.  He has not been sleeping very well, although denied major depressive symptoms when asked.  HTN: Notes Ok when checked at rehab.Taking amlodipine 39m, HCTZ, lisinopril.   Not checking BP's at home. Denies chest pain, shortness of breath.  Had his HCTZ increased by cardiology on f/u visit. .Marland Kitchen   HLD: Taking atorvastatin daily  Hx Stroke/LEFT Hemiplegia:Stroke in January 2019, affecting LEFT side function; was wearing brace on the LEFT LE.Recurrent stroke as above noted, currently in rehab after. This stroke affected speech. TakingPlavixand 860mASA, on statin.  Neck andLEFT shoulder pain noted prior to most recent CVRCV:ELFYBOFBPHe thinks he sleeps on it wrong sometimes and this causes the pain.Baclofen was tried and referred to pain management and has an upcoming appointment Feb 23rd. His wife noted he would really be helped with better pain management. On neurontin.  GERD: Had prior to recent CVA,  Was on protonix. Restarted today to help.  Diabetes mellitustype 2 -wife notes that his blood sugars have not been well controlled since home.  On checks daily, it is usually in the 200s.  He is currently taking 10 units/day of his insulin, given in the morning.  He was on 30 units before he went into the hospital.   Last eye exam:Noted was last month, glasses prescribed.  Lab Results  Component Value Date   HGBA1C 14.3 (H) 09/06/2019   Current Medication Management: Insulin and Taking glipizide 33m daily. Had taken metformin in past but gave him significant diarrhea.  ACEI/ARB:Yes Statin:Yes Aspirin therapy:Yes  Tobacco dep -he has not used tobacco since he left the hospital, and does use nicotine patches  to help him with urges.  Continued complete cessation strongly encouraged.     Patient Active Problem List   Diagnosis Date Noted  . Chest pain of uncertain etiology [id number] . History of CVA (cerebrovascular accident) 09/05/2019  . Hyperglycemia 09/05/2019  . Anemia 09/05/2019  . AKI (acute kidney injury) ([place] 09/05/2019  . Atypical pneumonia 09/05/2019  . Coagulation disorder ([place] 06/12/2019  . Gastroesophageal reflux disease   . Hemiplegia and hemiparesis following cerebral infarction affecting left non-dominant side ([facility] 11/25/2018  . 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 RBBB 10/15/2018  . Sinus bradycardia 10/15/2018  . Dysphagia, post-stroke   . Neuropathic pain   . Lacunar infarct, acute ([facility] 09/06/2018  . TIA (transient ischemic attack) 09/01/2018  . Microalbuminuria 07/29/2018  . Left leg pain 01/13/2018  . Hyperlipidemia 02/11/2017  . Recurrent strokes ([facility] 11/10/2016  . Essential hypertension 11/10/2016  . Diabetes ([facility] 11/10/2016  . Tobacco abuse 08/28/2013      Current Outpatient Medications:  .  amLODipine (NORVASC) 10 MG tablet, Take 1 tablet (10 mg total) by mouth daily., Disp: 90 tablet, Rfl: 3 .  aspirin EC 81 MG tablet, Take 81 mg by mouth daily., Disp: , Rfl:  .  atorvastatin (LIPITOR) 80 MG tablet, Take 1 tablet (80 mg total) by mouth every evening., Disp: 30 tablet, Rfl: 0 .  Blood Glucose Monitoring Suppl (FIFTY50 GLUCOSE METER 2.0) w/Device KIT, Use as instructed, Disp: , Rfl:  .  clopidogrel (PLAVIX) 75 MG tablet, Take 1 tablet (75 mg total) by mouth daily., Disp: 30 tablet, Rfl: 1 .  gabapentin (NEURONTIN) 300 MG capsule, Take 2 capsules (600 mg total) by mouth 2 (two) times daily., Disp: 360 capsule, Rfl: 1 .  glipiZIDE (GLUCOTROL) 5 MG tablet, TAKE 1 TABLET BY MOUTH DAILY BEFORE BREAKFAST, Disp: 90 tablet, Rfl: 1 .  hydrochlorothiazide (HYDRODIURIL) 25 MG tablet, Take 1 tablet (25 mg total) by mouth every morning., Disp: 90 tablet, Rfl: 3 .   insulin detemir (LEVEMIR) 100 UNIT/ML injection, Inject 10 Units into the skin daily., Disp: , Rfl:  .  lisinopril (ZESTRIL) 40 MG tablet, TAKE 1 TABLET BY MOUTH DAILY, Disp: 90 tablet, Rfl: 1 .  pantoprazole (PROTONIX) 40 MG tablet, TAKE 1 TABLET BY MOUTH DAILY BEFORE BREAKFAST, Disp: 30 tablet, Rfl: 2 .  baclofen (LIORESAL) 10 MG tablet, TAKE 1 TABLET BY MOUTH 3 TIMES DAILY (Patient not taking: Reported on 10/02/2019), Disp: 30 each, Rfl: 2 .  Insulin Pen Needle (NOVOFINE) 32G X 6 MM MISC, 1 each by Does not apply route once a week. (Patient not taking: Reported on 10/02/2019), Disp: 100 each, Rfl: 1   No Known Allergies   Past Surgical History:  Procedure Laterality Date  . ESOPHAGOGASTRODUODENOSCOPY (EGD) WITH PROPOFOL N/A 04/26/2019   Procedure: ESOPHAGOGASTRODUODENOSCOPY (EGD) WITH PROPOFOL;  Surgeon: VLin Landsman MD;  [place]  Service: Gastroenterology;  Laterality: N/A;     Family History  Problem Relation Age of  Onset  . Hypertension Mother   . Stroke Mother        died @ age 48  . Hypertension Father   . Diabetes Father   . Heart attack Father        died @ 66     Social History   Socioeconomic History  . Marital status: Married    Spouse name: Presenter, broadcasting  . Number of children: 5  . Years of education: Not on file  . Highest education level: Not on file  Occupational History  . Occupation: unemployed  Tobacco Use  . Smoking status: Current Every Day Smoker    Packs/day: 1.00    Years: 38.00    Pack years: 38.00    Types: Cigarettes    Start date: 89  . Smokeless tobacco: Never Used  Substance and Sexual Activity  . Alcohol use: Not Currently    Alcohol/week: 1.0 standard drinks    Types: 1 Cans of beer per week    Comment: previously drank but nothing in 1-2 yrs (08/2019).  . Drug use: Not Currently    Types: Cocaine, Marijuana    Comment: prev used cocaine/marijuana but none x 1-2 yrs (08/2019).  . Sexual activity: Yes    Other Topics Concern  . Not on file  Social History Narrative   Lives in [place] with his wife.  Uses a cane when ambulating.  Can walk about 25 yds prior to having to rest - says he stumbles if he has to work further than that.  Usually uses a scooter to get around stores.   Social Determinants of Health   Financial Resource Strain:   . Difficulty of Paying Living Expenses: Not on file  Food Insecurity:   . Worried About Charity fundraiser in the Last Year: Not on file  . Ran Out of Food in the Last Year: Not on file  Transportation Needs: No Transportation Needs  . Lack of Transportation (Medical): No  . Lack of Transportation (Non-Medical): No  Physical Activity: Unknown  . Days of Exercise per Week: 0 days  . Minutes of Exercise per Session: Not asked  Stress: No Stress Concern Present  . Feeling of Stress : Only a little  Social Connections:   . Frequency of Communication with Friends and Family: Not on file  . Frequency of Social Gatherings with Friends and Family: Not on file  . Attends Religious Services: Not on file  . Active Member of Clubs or Organizations: Not on file  . Attends Archivist Meetings: Not on file  . Marital Status: Not on file  Intimate Partner Violence:   . Fear of Current or Ex-Partner: Not on file  . Emotionally Abused: Not on file  . Physically Abused: Not on file  . Sexually Abused: Not on file    With staff assistance, above reviewed with the patient/caregiver today.  ROS: As per HPI, otherwise no specific complaints on a limited and focused system review   No results found for this or any previous visit (from the past 72 hour(s)).   PHQ2/9: Depression screen [facility] 2/9 10/02/2019 07/18/2019 04/14/2019 01/31/2019 11/25/2018  Decreased Interest 0 0 0 0 0  Down, Depressed, Hopeless 0 0 0 0 0  PHQ - 2 Score 0 0 0 0 0  Altered sleeping 3 0 0 0 0  Tired, decreased energy 1 0 0 0 0  Change in appetite 3 0 0 0 0  Feeling bad or  failure about  yourself  0 0 0 0 0  Trouble concentrating 0 0 0 0 0  Moving slowly or fidgety/restless 3 0 0 0 0  Suicidal thoughts 0 0 0 0 0  PHQ-9 Score 10 0 0 0 0  Difficult doing work/chores Not difficult at all - Not difficult at all Not difficult at all Not difficult at all   PHQ-2/9 Result is negative for depression   Fall Risk: Fall Risk  10/02/2019 07/18/2019 04/14/2019 01/31/2019 01/31/2019  Falls in the past year? 0 0 0 0 0  Number falls in past yr: 0 0 0 0 0  Injury with Fall? 0 0 0 0 0  Follow up - - - - -      Objective:   Vitals:   10/02/19 1451  BP: 92/62  Resp: 20  Temp: (!) 96.2 F (35.7 C)  TempSrc: Temporal  Weight: 165 lb 12.8 oz (75.2 kg)  Height: 5' 6"  (1.676 m)    Body mass index is 26.76 kg/m.  Physical Exam   NAD, masked, speech limited to one word answers mostly, some difficulty clearly understanding speech and wife helpful.  HEENT - [place]/AT, sclera anicteric, PERRL, EOMI, conj - non-inj'ed, pharynx clear Neck - supple, no adenopathy,  Car - RRR without m/g/r Pulm- RR and effort normal at rest, CTA without wheeze or rales Abd - soft, NT, ND, BS+,  no masses Back - no CVA tenderness Ext - no LE edema, brace on Left LE distal,  Neuro/psychiatric - speech limited as above noted  Alert   Grossly non-focal - significant left sided weakness in the upper and lower ext's, also with left shoulder shrug vs right. Could grip with left hand but much weaker than right, uses cane to ambulate,     Results for orders placed or performed during the hospital encounter of 09/04/19  SARS CORONAVIRUS 2 (TAT 6-24 HRS) Nasopharyngeal Nasopharyngeal Swab   Specimen: Nasopharyngeal Swab  Result Value Ref Range   SARS Coronavirus 2 NEGATIVE NEGATIVE  MRSA PCR Screening   Specimen: Nasopharyngeal  Result Value Ref Range   MRSA by PCR NEGATIVE NEGATIVE  NM Myocar Multi W/Spect W/Wall Motion / EF  Result Value Ref Range   Rest HR 77 bpm   Rest BP 116/77 mmHg    Exercise duration (sec) 0 sec   Percent HR 53 %   Exercise duration (min) 0 min   Estimated workload 1.0 METS   Peak HR 85 bpm   Peak BP 101/65 mmHg   MPHR 158 bpm   SSS 4    SRS 6    SDS 0    TID 0.85    LV sys vol 34 mL   LV dias vol 86 62 - 150 mL  Basic metabolic panel  Result Value Ref Range   Sodium 137 135 - 145 mmol/L   Potassium 3.9 3.5 - 5.1 mmol/L   Chloride 98 98 - 111 mmol/L   CO2 23 22 - 32 mmol/L   Glucose, Bld 524 (HH) 70 - 99 mg/dL   BUN 27 (H) 8 - 23 mg/dL   Creatinine, Ser 1.45 (H) 0.61 - 1.24 mg/dL   Calcium 10.2 8.9 - 10.3 mg/dL   GFR calc non Af Amer 51 (L) >60 mL/min   GFR calc Af Amer 59 (L) >60 mL/min   Anion gap 16 (H) 5 - 15  CBC  Result Value Ref Range   WBC 5.7 4.0 - 10.5 K/uL   RBC 4.17 (L) 4.22 -  5.81 MIL/uL   Hemoglobin 11.9 (L) 13.0 - 17.0 g/dL   HCT 36.1 (L) 39.0 - 52.0 %   MCV 86.6 80.0 - 100.0 fL   MCH 28.5 26.0 - 34.0 pg   MCHC 33.0 30.0 - 36.0 g/dL   RDW 15.1 11.5 - 15.5 %   Platelets 199 150 - 400 K/uL   nRBC 0.3 (H) 0.0 - 0.2 %  Fibrin derivatives D-Dimer  Result Value Ref Range   Fibrin derivatives D-dimer ([facility]) 539.30 (H) 0.00 - 499.00 ng/mL (FEU)  Glucose, capillary  Result Value Ref Range   Glucose-Capillary 445 (H) 70 - 99 mg/dL  HIV Antibody (routine testing w rflx)  Result Value Ref Range   HIV Screen 4th Generation wRfx NON REACTIVE NON REACTIVE  Urine Drug Screen, Qualitative ([facility] only)  Result Value Ref Range   Tricyclic, Ur Screen NONE DETECTED NONE DETECTED   Amphetamines, Ur Screen NONE DETECTED NONE DETECTED   MDMA (Ecstasy)Ur Screen NONE DETECTED NONE DETECTED   Cocaine Metabolite,Ur [place] NONE DETECTED NONE DETECTED   Opiate, Ur Screen NONE DETECTED NONE DETECTED   Phencyclidine (PCP) Ur S NONE DETECTED NONE DETECTED   Cannabinoid 50 Ng, Ur [place] NONE DETECTED NONE DETECTED   Barbiturates, Ur Screen NONE DETECTED NONE DETECTED   Benzodiazepine, Ur Scrn NONE DETECTED NONE DETECTED   Methadone Scn, Ur NONE  DETECTED NONE DETECTED  Glucose, capillary  Result Value Ref Range   Glucose-Capillary 412 (H) 70 - 99 mg/dL  Vitamin B12  Result Value Ref Range   Vitamin B-12 569 180 - 914 pg/mL  Folate  Result Value Ref Range   Folate 11.8 >5.9 ng/mL  Iron and TIBC  Result Value Ref Range   Iron 85 45 - 182 ug/dL   TIBC 363 250 - 450 ug/dL   Saturation Ratios 23 17.9 - 39.5 %   UIBC 278 ug/dL  Glucose, capillary  Result Value Ref Range   Glucose-Capillary 433 (H) 70 - 99 mg/dL  Glucose, capillary  Result Value Ref Range   Glucose-Capillary 441 (H) 70 - 99 mg/dL  Glucose, capillary  Result Value Ref Range   Glucose-Capillary 366 (H) 70 - 99 mg/dL  Glucose, capillary  Result Value Ref Range   Glucose-Capillary 392 (H) 70 - 99 mg/dL  Glucose, capillary  Result Value Ref Range   Glucose-Capillary 249 (H) 70 - 99 mg/dL  Glucose, capillary  Result Value Ref Range   Glucose-Capillary 291 (H) 70 - 99 mg/dL  Basic metabolic panel  Result Value Ref Range   Sodium 145 135 - 145 mmol/L   Potassium 2.7 (LL) 3.5 - 5.1 mmol/L   Chloride 109 98 - 111 mmol/L   CO2 23 22 - 32 mmol/L   Glucose, Bld 257 (H) 70 - 99 mg/dL   BUN 26 (H) 8 - 23 mg/dL   Creatinine, Ser 1.36 (H) 0.61 - 1.24 mg/dL   Calcium 9.9 8.9 - 10.3 mg/dL   GFR calc non Af Amer 55 (L) >60 mL/min   GFR calc Af Amer >60 >60 mL/min   Anion gap 13 5 - 15  Basic metabolic panel  Result Value Ref Range   Sodium 146 (H) 135 - 145 mmol/L   Potassium 2.9 (L) 3.5 - 5.1 mmol/L   Chloride 108 98 - 111 mmol/L   CO2 25 22 - 32 mmol/L   Glucose, Bld 147 (H) 70 - 99 mg/dL   BUN 24 (H) 8 - 23 mg/dL   Creatinine, Ser 1.24 0.61 -  1.24 mg/dL   Calcium 9.9 8.9 - 10.3 mg/dL   GFR calc non Af Amer >60 >60 mL/min   GFR calc Af Amer >60 >60 mL/min   Anion gap 13 5 - 15  Glucose, capillary  Result Value Ref Range   Glucose-Capillary 174 (H) 70 - 99 mg/dL  Glucose, capillary  Result Value Ref Range   Glucose-Capillary 120 (H) 70 - 99 mg/dL   Glucose, capillary  Result Value Ref Range   Glucose-Capillary 169 (H) 70 - 99 mg/dL  Glucose, capillary  Result Value Ref Range   Glucose-Capillary 216 (H) 70 - 99 mg/dL   Comment 1 Document in Chart   Basic metabolic panel  Result Value Ref Range   Sodium 141 135 - 145 mmol/L   Potassium 4.1 3.5 - 5.1 mmol/L   Chloride 107 98 - 111 mmol/L   CO2 23 22 - 32 mmol/L   Glucose, Bld 225 (H) 70 - 99 mg/dL   BUN 23 8 - 23 mg/dL   Creatinine, Ser 1.07 0.61 - 1.24 mg/dL   Calcium 9.7 8.9 - 10.3 mg/dL   GFR calc non Af Amer >60 >60 mL/min   GFR calc Af Amer >60 >60 mL/min   Anion gap 11 5 - 15  Basic metabolic panel  Result Value Ref Range   Sodium 142 135 - 145 mmol/L   Potassium 4.9 3.5 - 5.1 mmol/L   Chloride 108 98 - 111 mmol/L   CO2 25 22 - 32 mmol/L   Glucose, Bld 228 (H) 70 - 99 mg/dL   BUN 23 8 - 23 mg/dL   Creatinine, Ser 1.17 0.61 - 1.24 mg/dL   Calcium 9.4 8.9 - 10.3 mg/dL   GFR calc non Af Amer >60 >60 mL/min   GFR calc Af Amer >60 >60 mL/min   Anion gap 9 5 - 15  Glucose, capillary  Result Value Ref Range   Glucose-Capillary 212 (H) 70 - 99 mg/dL  Hemoglobin A1c  Result Value Ref Range   Hgb A1c MFr Bld 14.3 (H) 4.8 - 5.6 %   Mean Plasma Glucose 363.71 mg/dL  Glucose, capillary  Result Value Ref Range   Glucose-Capillary 190 (H) 70 - 99 mg/dL   Comment 1 Notify RN   Glucose, capillary  Result Value Ref Range   Glucose-Capillary 188 (H) 70 - 99 mg/dL  Glucose, capillary  Result Value Ref Range   Glucose-Capillary 141 (H) 70 - 99 mg/dL  ECHOCARDIOGRAM COMPLETE  Result Value Ref Range   Weight 2,577.6 oz   Height 65 in   BP 144/85 mmHg  Troponin I (High Sensitivity)  Result Value Ref Range   Troponin I (High Sensitivity) 6 <18 ng/L  Troponin I (High Sensitivity)  Result Value Ref Range   Troponin I (High Sensitivity) 5 <18 ng/L  Troponin I (High Sensitivity)  Result Value Ref Range   Troponin I (High Sensitivity) 7 <18 ng/L       Assessment &  Plan:   1. Hemiplegia and hemiparesis following cerebral infarction affecting left non-dominant side ([place]), speech  Continue with rehab Continue with Plavix and an aspirin a day. - BASIC METABOLIC PANEL WITH GFR - CBC with Differential/Platelet  2. Type 2 diabetes mellitus with other circulatory complication, with long-term current use of insulin (HCC)  We will consult endocrine to help with better blood sugar control here in the very near future, do feel having their involvement is important at this time.  Patient and patient's wife are  in agreement.  Also feel checking some simple labs as ordered was important.  Continue with his current insulin regimen and checking the sugars daily as is doing. - Ambulatory referral to Endocrinology - Hemoglobin Q1D - BASIC METABOLIC PANEL WITH GFR - CBC with Differential/Platelet  3. Essential hypertension Continue with the blood pressure medicines. Blood pressure not high here today, almost concerningly lower than would like, and will continue to monitor.  He does have them checked at rehab when he goes twice weekly.  Also cardiology has been involved with his blood pressure management. - BASIC METABOLIC PANEL WITH GFR - CBC with Differential/Platelet  4. Mixed hyperlipidemia Continue with the statin.  He was not fasting today, and just had a grilled cheese sandwich, and we will not check a lipid panel presently.  We will check in the future.  5. Tobacco abuse Continue with efforts for complete tobacco cessation, and has been successful so far.  Provided encouragement to continue and the importance of that.  Does have nicotine supplements to help.  6. Recurrent strokes ([place])   7. Gastroesophageal reflux disease  Refilled the Protonix product a day to hopefully help. - pantoprazole (PROTONIX) 40 MG tablet; TAKE 1 TABLET BY MOUTH DAILY BEFORE BREAKFAST  Dispense: 30 tablet; Refill: 2 - CBC with Differential/Platelet  8. Sleep  difficulties  Hesitant to add medicine to help with sleep at this time, especially a hypnotic type medicine, and discussed the reasons why with them today.  Also currently obtaining the heart monitor to assess.  May add on follow-up pending how he is doing over time.  We will schedule a follow-up in about 6 weeks time, and emphasized following up sooner as needed.     Towanda Malkin, MD 10/02/19 3:10 PM

## 2019-10-03 LAB — CBC WITH DIFFERENTIAL/PLATELET
Absolute Monocytes: 362 cells/uL (ref 200–950)
Basophils Absolute: 20 cells/uL (ref 0–200)
Basophils Relative: 0.4 %
Eosinophils Absolute: 122 cells/uL (ref 15–500)
Eosinophils Relative: 2.4 %
HCT: 35.7 % — ABNORMAL LOW (ref 38.5–50.0)
Hemoglobin: 12.1 g/dL — ABNORMAL LOW (ref 13.2–17.1)
Lymphs Abs: 1719 cells/uL (ref 850–3900)
MCH: 28.7 pg (ref 27.0–33.0)
MCHC: 33.9 g/dL (ref 32.0–36.0)
MCV: 84.6 fL (ref 80.0–100.0)
MPV: 10.7 fL (ref 7.5–12.5)
Monocytes Relative: 7.1 %
Neutro Abs: 2876 cells/uL (ref 1500–7800)
Neutrophils Relative %: 56.4 %
Platelets: 294 10*3/uL (ref 140–400)
RBC: 4.22 10*6/uL (ref 4.20–5.80)
RDW: 14.3 % (ref 11.0–15.0)
Total Lymphocyte: 33.7 %
WBC: 5.1 10*3/uL (ref 3.8–10.8)

## 2019-10-03 LAB — BASIC METABOLIC PANEL WITH GFR
BUN/Creatinine Ratio: 14 (calc) (ref 6–22)
BUN: 23 mg/dL (ref 7–25)
CO2: 23 mmol/L (ref 20–32)
Calcium: 9.7 mg/dL (ref 8.6–10.3)
Chloride: 103 mmol/L (ref 98–110)
Creat: 1.6 mg/dL — ABNORMAL HIGH (ref 0.70–1.25)
GFR, Est African American: 53 mL/min/{1.73_m2} — ABNORMAL LOW (ref >=60)
GFR, Est Non African American: 45 mL/min/{1.73_m2} — ABNORMAL LOW (ref >=60)
Glucose, Bld: 256 mg/dL — ABNORMAL HIGH (ref 65–99)
Potassium: 3.7 mmol/L (ref 3.5–5.3)
Sodium: 140 mmol/L (ref 135–146)

## 2019-10-03 LAB — HEMOGLOBIN A1C
Hgb A1c MFr Bld: 13.2 % of total Hgb — ABNORMAL HIGH (ref <5.7)
Mean Plasma Glucose: 332 (calc)
eAG (mmol/L): 18.4 (calc)

## 2019-10-04 ENCOUNTER — Ambulatory Visit: Payer: Medicaid Other | Admitting: Occupational Therapy

## 2019-10-04 ENCOUNTER — Ambulatory Visit: Payer: Medicaid Other

## 2019-10-04 ENCOUNTER — Encounter: Payer: Self-pay | Admitting: Occupational Therapy

## 2019-10-04 ENCOUNTER — Other Ambulatory Visit: Payer: Self-pay

## 2019-10-04 DIAGNOSIS — R2689 Other abnormalities of gait and mobility: Secondary | ICD-10-CM | POA: Diagnosis not present

## 2019-10-04 DIAGNOSIS — M6281 Muscle weakness (generalized): Secondary | ICD-10-CM

## 2019-10-04 DIAGNOSIS — R278 Other lack of coordination: Secondary | ICD-10-CM

## 2019-10-04 DIAGNOSIS — R2681 Unsteadiness on feet: Secondary | ICD-10-CM | POA: Diagnosis not present

## 2019-10-04 DIAGNOSIS — I69354 Hemiplegia and hemiparesis following cerebral infarction affecting left non-dominant side: Secondary | ICD-10-CM

## 2019-10-04 NOTE — Therapy (Signed)
[place] MAIN [facility] SERVICES [address] [place], [place], [address] Phone: [phone]   Fax:  [phone]  Physical Therapy Treatment  Patient Details  Name: Evan ALWIN Sr. MRN: [id number] Date of Birth: 02-Dec-1955 Referring Provider (PT): Sharlene Dory   Encounter Date: 10/04/2019  PT End of Session - 10/04/19 1402    Visit Number  2    Number of Visits  8    Date for PT Re-Evaluation  11/22/19    Authorization Type  2/10 eval 2/10; 1/3 tx session on 3/3 do goals for CAID    PT Start Time  1431    PT Stop Time  1514    PT Time Calculation (min)  43 min    Equipment Utilized During Treatment  Gait belt;Other (comment)   L swedish knee brace, L AFO   Activity Tolerance  Patient tolerated treatment well    Behavior During Therapy  WFL for tasks assessed/performed       Past Medical History:  Diagnosis Date  . Carotid arterial disease ([place])    a. 08/2018 Carotid U/S: min-mod RICA atherosclerosis w/o hemodynamically significant stenosis. Nl LICA.  . Diabetes 1.5, managed as type 2 ([place])   . Diastolic dysfunction    a. 08/2018 Echo: EF 65%. No rwma. Gr1 DD. Mild MR.  Marland Kitchen Hypercholesterolemia   . Hypertension   . Left hemiparesis ([place])    a. Ambulated w/ Cane. Limited use of LUE.  . Pain in both feet   . Poorly controlled diabetes mellitus ([place])    a. 04/2019 A1c 13.8.  Marland Kitchen Recurrent strokes ([place])    a. 10/2016 MRI/A: Acute 70mm R thalamic infarct, ? subacute infarct of R corona radiata; b. 08/2017 MRI/A: Acute 34mm lateral L thalamic infarct. Other more remote lacunar infarcts of thalami bilat. Small vessel dzs; c. 08/2018 MRI/A: Acute lacunar infarct of the post limb of R internal capsule. Underlying advanced chrnoic small vessel dzs w/o hemodynamically significant stenosis.  . Tobacco abuse     Past Surgical History:  Procedure Laterality Date  . ESOPHAGOGASTRODUODENOSCOPY (EGD) WITH PROPOFOL N/A 04/26/2019   Procedure:  ESOPHAGOGASTRODUODENOSCOPY (EGD) WITH PROPOFOL;  Surgeon: Lin Landsman, MD;  Location: [place];  Service: Gastroenterology;  Laterality: N/A;    There were no vitals filed for this visit.  Subjective Assessment - 10/04/19 1400    Subjective  Patient has had two office visits since evaluation regarding stroke and hemiplegia. Per physician notes diabetes continue to be not controlled, BP lower than therapeutic range and will benefit from monitoring and cardiology. Is to wear ZIO monitor for 28 days, will wear two monitors with second one to be mailed to him.    Pertinent History  Patient is a pleasant 64 year old male returning to PT (evaluation on 09/04/19) with PMH of HTN, HLD, Type II DM, right basal ganglia stroke (08/2018), acute CVA, sinus bradycardia, RBBB, chronic knee pain, and peripheral neuropathy. 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  Per CIR documentation patient utilizes AFO and Swedish knee cage on LLE to prevent genu recurvatum. Patient was initially evaluated prior to recent hospitalization for L hemiplegia, went to inpatient rehab in 2020 for first CVA. Has an aide at home    Limitations  Lifting;Standing;Walking;House hold activities    How long can you sit comfortably?  n/a    How long can you stand comfortably?  needs to hold onto objects    How long can you walk comfortably?  needs AD and CGA    Patient Stated Goals  to get back to fishing, walking better, balance.    Currently in Pain?  No/denies           Patient has had two office visits since evaluation regarding stroke and hemiplegia. Per physician notes diabetes continue to be not controlled, BP lower than therapeutic range and will benefit from monitoring and cardiology. Is to wear ZIO monitor for 28 days, will wear two monitors with  second one to be mailed to him.   Patient forgot his knee brace at home.    Vitals at start of session : 114/67   Stand pivot transfer to transport chair from plinth table with CGA/Min A for directionality/cueing to neglect L side.   Standing in // bars: CGA for all standing interventions with cueing for safety awareness and body mechanics -weight shift with RUE support and CGA. Verbal cueing for sequencing, L knee hyperextension wieth weightbearing -marching to PT hand for visual cue of arc of motion, excessive knee hyperextension in LLE   Standing interventions terminated due to excessive L knee hyperextension/genu recurvatum with weightbearing resulting in instability and risk of injury to joint.    Stand pivot transfer back to plinth table: challenging for patient due to safety awareness  Seated edge of plinth table:close CGA due to posterior trunk lean with LE movement -marches with excessive trunk extension with hip flexion.   -cross body alternating reaches inside/outside BOS to SPT and PT hands for crossing midline and awareness of L side.   -soccer ball kicks for coordination with muscle activation recruitment patterns, spatial awareness, and timing of muscle recruitment ~ 4 minutes with one rest break, cueing for upright posture  -green dynadisc df/pf 10x  -seated balloon taps reaching inside/outside BOS for stability, spatial awareness, timing of muscle recruitment.  -RTB hamstring curls 12x each LE; cueing for control of eccentric portion for improved coordination  And muscle activation   Sitting EOB to supine, Min A for LLE for final position due to getting stuck EOB.   Supine: wedge under head -straight leg raise with opp LE in hooklying to support back, max cueing for knee extension for quadriceps activation; 10x each LE, more challenging with LLE,   -bolster under knee: SAQ 10x each LE tactile cueing to quadriceps for muscle activation, visual cue of PT hand for  arc of movement. Tethering of R to LLE with exertion noted.   -RTB abduction/clamshell with PT hand as visual cue for arc of movement with cues for single LE at a time. 10x each LE; challenging to recruit single LE at a time  -RTB marching in hooklying ; cues for keeping back flat on table, alternating LE's with hand for visual cue of arc of motion.   -RTB adduction single LE at a time against PT resistance; keeping opp LE in neutral position, challenging for LLE to maintain neutral positioning. 10x each LE   -green swiss ball hamstring curls with stabilization provided to LE's on ball; bilateral 10x, single LLE 10x   -bridges: stabilization provided to LLE ;noted preference through RLE 10x;   Patient requires frequent cueing for reduction of tethering of LE's.    Pt educated throughout session about proper posture and technique with exercises. Improved exercise technique, movement at target joints, use of target muscles after min  to mod verbal, visual, tactile cues. Patient additionally educated on need to bring Swedish knee brace for L knee for standing interventions.    Patient presents to physical therapy with excellent motivation. Patient forgot his knee brace at home resulting in excessive L knee hyperextension/ genu recurvatum in weightbearing, due to risk of injury standing interventions terminated in favor of nonweightbearing strengthening and coordination interventions.   Patient is challenged with single LE recruitment patterning with frequent tethering of LE's. Patient will benefit from skilled physical therapy to address LLE coordination, strength, and stability to optimize independence, increase mobility, and quality of life.         PT Education - 10/04/19 1401    Education Details  body mechanics, exercise technique    Person(s) Educated  Patient    Methods  Explanation;Demonstration;Tactile cues;Verbal cues    Comprehension  Verbalized understanding;Returned  demonstration;Verbal cues required;Tactile cues required       PT Short Term Goals - 09/27/19 1653      PT SHORT TERM GOAL #1   Title  Patient will be independent with HEP for improved therapeutic gains and self-management.    Baseline  2/10: give next session    Time  4    Period  Weeks    Status  New    Target Date  10/25/19        PT Long Term Goals - 09/27/19 1653      PT LONG TERM GOAL #1   Title  Patient will increase Berg Balance score by > 6 points (30/56) to demonstrate decreased fall risk during functional activities.    Baseline  2/10: 24/56    Time  8    Period  Weeks    Status  New    Target Date  11/22/19      PT LONG TERM GOAL #2   Title  Patient will increase 10 meter walk test to >1.43m/s as to improve gait speed for better community ambulation and to reduce fall risk.    Baseline  2/10: 0.31 m/s with hurrycane and w/c follow    Time  8    Period  Weeks    Status  New    Target Date  11/22/19      PT LONG TERM GOAL #3   Title  Patient (> 4 years old) will complete five times sit to stand test in < 15 seconds with no UE support indicating an increased LE strength and improved balance.    Baseline  2/10: 17 seconds heavy RUE support and use only of RLE    Time  8    Period  Weeks    Status  New    Target Date  11/22/19      PT LONG TERM GOAL #4   Title  Patient will increase BLE gross strength to 4+/5 as to improve functional strength for independent gait, increased standing tolerance and increased ADL ability.    Baseline  2/10: see evaluation note    Time  8    Period  Weeks    Status  New    Target Date  11/22/19            Plan - 10/04/19 1457    Clinical Impression Statement  Patient presents to physical therapy with excellent motivation. Patient forgot his knee brace at home resulting in excessive L knee hyperextension/ genu recurvatum in weightbearing, due to risk of injury standing interventions terminated in favor of nonweightbearing  strengthening and coordination  interventions.   Patient is challenged with single LE recruitment patterning with frequent tethering of LE's. Patient will benefit from skilled physical therapy to address LLE coordination, strength, and stability to optimize independence, increase mobility, and quality of life.    Personal Factors and Comorbidities  Age;Education;Sex;Social Background;Behavior Pattern;Finances;Past/Current Experience;Time since onset of injury/illness/exacerbation;Comorbidity 3+;Transportation    Comorbidities  PMH of HTN, HLD, Type II DM, right basal ganglia stroke (08/2018), acute CVA, sinus bradycardia, RBBB, chronic knee pain, and peripheral neuropathy    Examination-Activity Limitations  Bathing;Bed Mobility;Dressing;Transfers;Squat;Lift;Locomotion Level;Stairs;Reach Overhead;Carry;Stand;Sit    Examination-Participation Restrictions  Interpersonal Relationship;Yard Work;Laundry;Cleaning;Community Activity;Meal Prep;Medication Management;Driving;Shop;Volunteer    Stability/Clinical Decision Making  Unstable/Unpredictable    Rehab Potential  Fair    PT Frequency  1x / week    PT Duration  8 weeks    PT Treatment/Interventions  ADLs/Self Care Home Management;Aquatic Therapy;Moist Heat;Cryotherapy;Gait training;Stair training;Functional mobility training;Therapeutic activities;Therapeutic exercise;Patient/family education;Neuromuscular re-education;Balance training;Orthotic Fit/Training;Manual techniques;Passive range of motion;Dry needling;Taping;Energy conservation;Splinting;Joint Manipulations;Spinal Manipulations;Electrical Stimulation;Ultrasound;Vestibular;Biofeedback;Iontophoresis 4mg /ml Dexamethasone;DME Instruction;Visual/perceptual remediation/compensation    PT Next Visit Plan  HEP, // bars, safety awareness    PT Home Exercise Plan  give next one    Consulted and Agree with Plan of Care  Patient;Family member/caregiver    Family Member Consulted  wife       Patient will  benefit from skilled therapeutic intervention in order to improve the following deficits and impairments:  Abnormal gait, Decreased balance, Decreased endurance, Decreased mobility, Difficulty walking, Hypomobility, Increased muscle spasms, Impaired tone, Decreased range of motion, Decreased coordination, Decreased strength, Impaired UE functional use, Pain, Decreased activity tolerance, Decreased safety awareness, Impaired perceived functional ability, Impaired sensation, Improper body mechanics, Postural dysfunction, Decreased knowledge of use of DME, Decreased knowledge of precautions, Impaired flexibility, Impaired vision/preception  Visit Diagnosis: Muscle weakness (generalized)  Unsteadiness on feet  Other abnormalities of gait and mobility  Hemiplegia and hemiparesis following cerebral infarction affecting left non-dominant side [facility])     Problem List Patient Active Problem List   Diagnosis Date Noted  . Chest pain of uncertain etiology 09/05/2019  . History of CVA (cerebrovascular accident) 09/05/2019  . Hyperglycemia 09/05/2019  . Anemia 09/05/2019  . AKI (acute kidney injury) (HCC) 09/05/2019  . Atypical pneumonia 09/05/2019  . Coagulation disorder (HCC) 06/12/2019  . Gastroesophageal reflux disease   . Hemiplegia and hemiparesis following cerebral infarction affecting left non-dominant side (HCC) 11/25/2018  . 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 RBBB 10/15/2018  . Sinus bradycardia 10/15/2018  . Dysphagia, post-stroke   . Neuropathic pain   . Lacunar infarct, acute (HCC) 09/06/2018  . TIA (transient ischemic attack) 09/01/2018  . Microalbuminuria 07/29/2018  . Left leg pain 01/13/2018  . Hyperlipidemia 02/11/2017  . Recurrent strokes (HCC) 11/10/2016  . Essential hypertension 11/10/2016  . Diabetes (HCC) 11/10/2016  . Tobacco abuse 08/28/2013   10/26/2013, PT, DPT   10/04/2019, 3:16 PM  [facility] MAIN [facility] SERVICES [address] [place], [place], [place] Phone: [phone]   Fax:  [phone]  Name: Evan MCCAHILL Sr. MRN: Sandie Ano Date of Birth: 06-Mar-1956

## 2019-10-04 NOTE — Therapy (Signed)
[facility] MAIN [facility] SERVICES [address] [place], [place], [address] Phone: [phone]   Fax:  [phone]  Occupational Therapy Treatment  Patient Details  Name: MALEKI HIPPE Sr. MRN: [id number] Date of Birth: [birth year] Referring Provider (OT): Hope Pigeon   Encounter Date: 10/04/2019  OT End of Session - 10/04/19 1556    Visit Number  2    Number of Visits  24    Date for OT Re-Evaluation  12/20/19    OT Start Time  1345    OT Stop Time  1430    OT Time Calculation (min)  45 min    Activity Tolerance  Patient tolerated treatment well    Behavior During Therapy  [facility] for tasks assessed/performed       Past Medical History:  Diagnosis Date  . Carotid arterial disease ([facility])    a. 08/2018 Carotid U/S: min-mod RICA atherosclerosis w/o hemodynamically significant stenosis. Nl LICA.  . Diabetes 1.5, managed as type 2 ([facility])   . Diastolic dysfunction    a. 08/2018 Echo: EF 65%. No rwma. Gr1 DD. Mild MR.  Marland Kitchen Hypercholesterolemia   . Hypertension   . Left hemiparesis ([facility])    a. Ambulated w/ Cane. Limited use of LUE.  . Pain in both feet   . Poorly controlled diabetes mellitus ([facility])    a. 04/2019 A1c 13.8.  Marland Kitchen Recurrent strokes ([facility])    a. 10/2016 MRI/A: Acute 4mm R thalamic infarct, ? subacute infarct of R corona radiata; b. 08/2017 MRI/A: Acute 30mm lateral L thalamic infarct. Other more remote lacunar infarcts of thalami bilat. Small vessel dzs; c. 08/2018 MRI/A: Acute lacunar infarct of the post limb of R internal capsule. Underlying advanced chrnoic small vessel dzs w/o hemodynamically significant stenosis.  . Tobacco abuse     Past Surgical History:  Procedure Laterality Date  . ESOPHAGOGASTRODUODENOSCOPY (EGD) WITH PROPOFOL N/A 04/26/2019   Procedure: ESOPHAGOGASTRODUODENOSCOPY (EGD) WITH PROPOFOL;  Surgeon: Toney Reil, MD;  Location: [facility] ENDOSCOPY;  Service: Gastroenterology;  Laterality: N/A;    There were no vitals  filed for this visit.  Subjective Assessment - 10/04/19 1555    Subjective   Pt. reports that it feels good to get his LUE stretched out.    Patient is accompanied by:  Family member    Pertinent History  Pt. is a 64 y.o. male with a history of a CVA with left hemiplegia one year ago was admitted to [facility] on 09/04/2019 with Atypical Pneumonia, Diabetic Ketoacidosis without coma. Pt. was then admitted to [facility] received inpatient rehabilitation services. Pt. resides with his wife who assists with all ADLs, and IADLs. Pt. was previously indepedent with ADL tasks, worked in Museum/gallery curator, and enjoys fishing.    Patient Stated Goals  To be able to put his shirt on by himself    Currently in Pain?  No/denies       OT TREATMENT    Neuro muscular re-education:  Pt. worked on weightbearing, and proprioceptive input through the LUE, and hand To inhibit tone, and prepare the UE for ROM, and facilitation of active movement. Pt. worked on weightbearing in side sitting at the edge of the mat with slow rocking forward, and pushing up into sitting with his LUE, and hand. Pt. Worked weightbearing with a flat open hand, and preparing the hand for weightbearing position.  Therapeutic Exercise:  Pt. tolerated PROM in all joint ranges of the LUE, and hand with slow gentle stretching is tone  reducing patterns. Pt. education was provided about self-ROM using the RUE to assist with ROM.   Manual Therapy:  Pt. Tolerated scapular mobilization in elevation, depression, and rotation while in sidelying to prepare the LUE for ROM. Pt. Tolerated soft tissue mobilizations metacarpal spread stretches to the left hand prior to ROM. Manual techniques were performed prior to, and independent of ROM/ther. Ex.  Response to Treatment   Pt. has a Cardiac monitor in place. Pt. presents with increased flexor tone, tightness, and spasticity in the LUE, and hand. Pt. presents with flexor synergistic patterns with attempts to  initiate active movement. Pt. continues to work on normalizing tone, and facilitating active functional movement in order to work toward maximizing LUE engagement in, and promoting independence with ADLs, and IADL tasks                        OT Long Term Goals - 09/27/19 1711      OT LONG TERM GOAL #1   Title  Pt. will be independent with donning his shirt using one armed dressing techniques    Baseline  Eval: Pt. requires assist form caregiver    Time  12    Period  Weeks    Status  New    Target Date  12/20/19      OT LONG TERM GOAL #2   Title  Pt. will donn LE dressing with modified independence    Baseline  Eval: MaxA    Time  12    Period  Weeks    Status  New    Target Date  12/20/19      OT LONG TERM GOAL #3   Title  Pt. will improve left digit flexion to be able to hold a toothbrush while applying toothpaste.    Baseline  Eval: Pt. is unable    Time  12    Period  Weeks    Status  New    Target Date  12/20/19      OT LONG TERM GOAL #4   Title  Pt. will improve left shoulder flexion,and abduction AROM by 20 degrees to be able to reach his head in preparation for brushing his hair.    Baseline  Eval: Pt. is unable    Time  12    Period  Weeks    Status  New    Target Date  12/20/19      OT LONG TERM GOAL #5   Title  Pt. will be independent with HEP for the LUE, and hand    Baseline  Eval: Pt. does not have a HEP    Time  12    Period  Weeks    Status  New    Target Date  12/20/19            Plan - 10/04/19 1557    Clinical Impression Statement  Pt. has a Cardiac monitor in place. Pt. presents with increased flexor tone, tightness, and spasticity in the LUE, and hand. Pt. presents with flexor synergistic patterns with attempts to initiate active movement. Pt. continues to work on normalizing tone, and facilitating active functional movement in order to work toward maximizing LUE engagement in, and promoting independence with ADLs, and  IADL tasks.    OT Occupational Profile and History  Detailed Assessment- Review of Records and additional review of physical, cognitive, psychosocial history related to current functional performance    Occupational performance deficits (Please refer to evaluation  for details):  ADL's;IADL's    Body Structure / Function / Physical Skills  ADL;GMC;Strength;IADL;FMC    Clinical Decision Making  Multiple treatment options, significant modification of task necessary    Modification or Assistance to Complete Evaluation   Max significant modification of tasks or assist is necessary to complete    OT Frequency  2x / week    OT Duration  12 weeks    OT Treatment/Interventions  Self-care/ADL training;Therapeutic exercise;Patient/family education;DME and/or AE instruction;Neuromuscular education;Manual Therapy;Therapeutic activities;Splinting;Moist Heat;Electrical Stimulation    Consulted and Agree with Plan of Care  Patient;Family member/caregiver       Patient will benefit from skilled therapeutic intervention in order to improve the following deficits and impairments:   Body Structure / Function / Physical Skills: ADL, GMC, Strength, IADL, FMC       Visit Diagnosis: Muscle weakness (generalized)  Other lack of coordination    Problem List Patient Active Problem List   Diagnosis Date Noted  . Chest pain of uncertain etiology 09/05/2019  . History of CVA (cerebrovascular accident) 09/05/2019  . Hyperglycemia 09/05/2019  . Anemia 09/05/2019  . AKI (acute kidney injury) (HCC) 09/05/2019  . Atypical pneumonia 09/05/2019  . Coagulation disorder (HCC) 06/12/2019  . Gastroesophageal reflux disease   . Hemiplegia and hemiparesis following cerebral infarction affecting left non-dominant side (HCC) 11/25/2018  . 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 RBBB 10/15/2018  . Sinus bradycardia 10/15/2018  . Dysphagia, post-stroke   . Neuropathic pain   . Lacunar infarct, acute (HCC) 09/06/2018  . TIA  (transient ischemic attack) 09/01/2018  . Microalbuminuria 07/29/2018  . Left leg pain 01/13/2018  . Hyperlipidemia 02/11/2017  . Recurrent strokes (HCC) 11/10/2016  . Essential hypertension 11/10/2016  . Diabetes (HCC) 11/10/2016  . Tobacco abuse 08/28/2013    Olegario Messier, MS, OTR/L 10/04/2019, 4:05 PM  [facility] MAIN [facility] SERVICES [address] [place], [place], [address] Phone: [phone]   Fax:  [phone]  Name: ZEFERINO MOUNTS Sr. MRN: [id number] Date of Birth: January 24, 1956

## 2019-10-09 ENCOUNTER — Ambulatory Visit: Payer: Medicaid Other | Admitting: Speech Pathology

## 2019-10-09 NOTE — Progress Notes (Signed)
History of CVA, you may have to speak with wife.

## 2019-10-10 ENCOUNTER — Ambulatory Visit
Payer: Medicaid Other | Attending: Student in an Organized Health Care Education/Training Program | Admitting: Student in an Organized Health Care Education/Training Program

## 2019-10-10 ENCOUNTER — Other Ambulatory Visit: Payer: Self-pay

## 2019-10-10 DIAGNOSIS — Z5329 Procedure and treatment not carried out because of patient's decision for other reasons: Secondary | ICD-10-CM

## 2019-10-10 NOTE — Progress Notes (Signed)
I attempted to call the patient however no response. Voicemail left instructing patient to call front desk office at [phone] to reschedule appointment. -Dr Kamori Kitchens  

## 2019-10-12 ENCOUNTER — Ambulatory Visit: Payer: Medicaid Other | Admitting: Occupational Therapy

## 2019-10-12 ENCOUNTER — Encounter: Payer: Self-pay | Admitting: Occupational Therapy

## 2019-10-12 ENCOUNTER — Other Ambulatory Visit: Payer: Self-pay

## 2019-10-12 DIAGNOSIS — M6281 Muscle weakness (generalized): Secondary | ICD-10-CM | POA: Diagnosis not present

## 2019-10-12 DIAGNOSIS — R2681 Unsteadiness on feet: Secondary | ICD-10-CM | POA: Diagnosis not present

## 2019-10-12 DIAGNOSIS — R2689 Other abnormalities of gait and mobility: Secondary | ICD-10-CM | POA: Diagnosis not present

## 2019-10-12 DIAGNOSIS — R278 Other lack of coordination: Secondary | ICD-10-CM | POA: Diagnosis not present

## 2019-10-12 DIAGNOSIS — I69354 Hemiplegia and hemiparesis following cerebral infarction affecting left non-dominant side: Secondary | ICD-10-CM | POA: Diagnosis not present

## 2019-10-12 NOTE — Therapy (Signed)
[place] [facility] MAIN [facility] SERVICES [address] [place], [place], [address] Phone: [phone]   Fax:  [phone]  Occupational Therapy Treatment  Patient Details  Name: Evan WILMETH Sr. MRN: [id number] Date of Birth: [birth year] Referring Provider (OT): Hope Pigeon   Encounter Date: 10/12/2019  OT End of Session - 10/12/19 1153    Visit Number  3    Number of Visits  24    Date for OT Re-Evaluation  12/20/19    Authorization Type  Medicaid    OT Start Time  1102    OT Stop Time  1140    OT Time Calculation (min)  38 min    Activity Tolerance  Patient tolerated treatment well    Behavior During Therapy  WFL for tasks assessed/performed       Past Medical History:  Diagnosis Date  . Carotid arterial disease (HCC)    a. 08/2018 Carotid U/S: min-mod RICA atherosclerosis w/o hemodynamically significant stenosis. Nl LICA.  . Diabetes 1.5, managed as type 2 (HCC)   . Diastolic dysfunction    a. 08/2018 Echo: EF 65%. No rwma. Gr1 DD. Mild MR.  Marland Kitchen Hypercholesterolemia   . Hypertension   . Left hemiparesis (HCC)    a. Ambulated w/ Cane. Limited use of LUE.  . Pain in both feet   . Poorly controlled diabetes mellitus (HCC)    a. 04/2019 A1c 13.8.  Marland Kitchen Recurrent strokes (HCC)    a. 10/2016 MRI/A: Acute 23mm R thalamic infarct, ? subacute infarct of R corona radiata; b. 08/2017 MRI/A: Acute 54mm lateral L thalamic infarct. Other more remote lacunar infarcts of thalami bilat. Small vessel dzs; c. 08/2018 MRI/A: Acute lacunar infarct of the post limb of R internal capsule. Underlying advanced chrnoic small vessel dzs w/o hemodynamically significant stenosis.  . Tobacco abuse     Past Surgical History:  Procedure Laterality Date  . ESOPHAGOGASTRODUODENOSCOPY (EGD) WITH PROPOFOL N/A 04/26/2019   Procedure: ESOPHAGOGASTRODUODENOSCOPY (EGD) WITH PROPOFOL;  Surgeon: Toney Reil, MD;  Location: [facility] ENDOSCOPY;  Service: Gastroenterology;  Laterality:  N/A;    There were no vitals filed for this visit.  Subjective Assessment - 10/12/19 1150    Subjective   Pt. reports the ROM feels good to his LUE    Patient is accompanied by:  Family member    Pertinent History  Pt. is a 64 y.o. male with a history of a CVA with left hemiplegia one year ago was admitted to [facility] on 09/04/2019 with Atypical Pneumonia, Diabetic Ketoacidosis without coma. Pt. was then admitted to [facility] received inpatient rehabilitation services. Pt. resides with his wife who assists with all ADLs, and IADLs. Pt. was previously indepedent with ADL tasks, worked in Museum/gallery curator, and enjoys fishing.    Limitations  LUE functioning, ADLs, and IADLs    Patient Stated Goals  To be able to put his shirt on by himself    Currently in Pain?  Yes    Pain Score  5     Pain Location  Elbow    Pain Orientation  Left    Pain Descriptors / Indicators  --   stretch with elbow extension at end ROM   Pain Type  Chronic pain       OT TREATMENT    Neuro muscular re-education:  Pt. worked on weightbearing, and proprioceptive input through the LUE, and hand To inhibit tone, and prepare the UE for ROM, and facilitation of active movement. Pt. worked on  weightbearing in side sitting at the edge of the mat with slow rocking forward, and pushing up into sitting with his LUE, and hand. Pt. also worked on weightbearing with a flat open hand, and preparing the hand for weightbearing position.  Therapeutic Exercise:  Pt. tolerated PROM in all joint ranges of the LUE, and hand with slow gentle stretching is tone reducing patterns. Pt. education was provided about self-ROM using the RUE to assist with ROM.   Manual Therapy:  Pt. Tolerated scapular mobilization in elevation, depression, and rotation while in sidelying to prepare the LUE for ROM. Pt. Tolerated soft tissue mobilizations metacarpal spread stretches to the left hand prior to ROM. Manual techniques were performed prior to, and  independent of ROM/ther. Ex.   Response to Treatment:  Pt. continues to present with increased flexor tone, tightness, and spasticity in the LUE, and hand. Pt. continues to present with flexor synergystic patterns with attmepts to initate active shoulder movements. Pt. tolerated ROM, and proprioceptive weightbearing through the LUE well. Pt. does present with 5/10 stretch pain at the end range of elbow extension. Pt. continues to work on normalizing tone in the LUE, and hand in preparation for facilating active volitinal movement, and increase LUE engagement during ADLs, and IADL tasks.                   OT Education - 10/12/19 1510    Education Details  LUE ROM    Person(s) Educated  Patient    Methods  Explanation    Comprehension  Verbalized understanding;Returned demonstration          OT Long Term Goals - 09/27/19 1711      OT LONG TERM GOAL #1   Title  Pt. will be independent with donning his shirt using one armed dressing techniques    Baseline  Eval: Pt. requires assist form caregiver    Time  12    Period  Weeks    Status  New    Target Date  12/20/19      OT LONG TERM GOAL #2   Title  Pt. will donn LE dressing with modified independence    Baseline  Eval: MaxA    Time  12    Period  Weeks    Status  New    Target Date  12/20/19      OT LONG TERM GOAL #3   Title  Pt. will improve left digit flexion to be able to hold a toothbrush while applying toothpaste.    Baseline  Eval: Pt. is unable    Time  12    Period  Weeks    Status  New    Target Date  12/20/19      OT LONG TERM GOAL #4   Title  Pt. will improve left shoulder flexion,and abduction AROM by 20 degrees to be able to reach his head in preparation for brushing his hair.    Baseline  Eval: Pt. is unable    Time  12    Period  Weeks    Status  New    Target Date  12/20/19      OT LONG TERM GOAL #5   Title  Pt. will be independent with HEP for the LUE, and hand    Baseline  Eval: Pt.  does not have a HEP    Time  12    Period  Weeks    Status  New    Target Date  12/20/19            Plan - 10/12/19 1153    Clinical Impression Statement  Pt. continues to present with increased flexor tone, tightness, and spasticity in the LUE, and hand. Pt. continues to present with flexor synergystic patterns with attmepts to initate active shoulder movements. Pt. tolerated ROM, and proprioceptive weightbearing through the LUE well. Pt. does present with 5/10 stretch pain at the end range of elbow extension. Pt. continues to work on normalizing tone in the LUE, and hand in preparation for facilating active volitinal movement, and increase LUE engagement during ADLs, and IADL tasks.    OT Occupational Profile and History  Detailed Assessment- Review of Records and additional review of physical, cognitive, psychosocial history related to current functional performance    Occupational performance deficits (Please refer to evaluation for details):  ADL's;IADL's    Body Structure / Function / Physical Skills  ADL;GMC;Strength;IADL;FMC    Rehab Potential  Good    Clinical Decision Making  Multiple treatment options, significant modification of task necessary    Modification or Assistance to Complete Evaluation   Max significant modification of tasks or assist is necessary to complete    OT Frequency  2x / week    OT Duration  12 weeks    OT Treatment/Interventions  Self-care/ADL training;Therapeutic exercise;Patient/family education;DME and/or AE instruction;Neuromuscular education;Manual Therapy;Therapeutic activities;Splinting;Moist Heat;Electrical Stimulation    Consulted and Agree with Plan of Care  Patient;Family member/caregiver       Patient will benefit from skilled therapeutic intervention in order to improve the following deficits and impairments:   Body Structure / Function / Physical Skills: ADL, GMC, Strength, IADL, [facility]       Visit Diagnosis: Muscle weakness  (generalized)    Problem List Patient Active Problem List   Diagnosis Date Noted  . Chest pain of uncertain etiology 60/05/9322  . History of CVA (cerebrovascular accident) 09/05/2019  . Hyperglycemia 09/05/2019  . Anemia 09/05/2019  . AKI (acute kidney injury) ([place]) 09/05/2019  . Atypical pneumonia 09/05/2019  . Coagulation disorder ([place]) 06/12/2019  . Gastroesophageal reflux disease   . Hemiplegia and hemiparesis following cerebral infarction affecting left non-dominant side ([place]) 11/25/2018  . 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 RBBB 10/15/2018  . Sinus bradycardia 10/15/2018  . Dysphagia, post-stroke   . Neuropathic pain   . Lacunar infarct, acute ([place]) 09/06/2018  . TIA (transient ischemic attack) 09/01/2018  . Microalbuminuria 07/29/2018  . Left leg pain 01/13/2018  . Hyperlipidemia 02/11/2017  . Recurrent strokes ([place]) 11/10/2016  . Essential hypertension 11/10/2016  . Diabetes ([place]) 11/10/2016  . Tobacco abuse 08/28/2013    Harrel Carina, MS, OTR/L 10/12/2019, 3:12 PM  [place] MAIN [facility] SERVICES [address] [place], [place], [address] Phone: [phone]   Fax:  [phone]  Name: Evan HALLEY Sr. MRN: [id number] Date of Birth: Dec 11, 1955

## 2019-10-16 ENCOUNTER — Ambulatory Visit (INDEPENDENT_AMBULATORY_CARE_PROVIDER_SITE_OTHER): Payer: Medicaid Other

## 2019-10-16 DIAGNOSIS — I639 Cerebral infarction, unspecified: Secondary | ICD-10-CM

## 2019-10-17 ENCOUNTER — Ambulatory Visit: Payer: Medicaid Other | Attending: *Deleted | Admitting: Physical Therapy

## 2019-10-17 ENCOUNTER — Encounter: Payer: Self-pay | Admitting: Physical Therapy

## 2019-10-17 ENCOUNTER — Encounter: Payer: Self-pay | Admitting: Occupational Therapy

## 2019-10-17 ENCOUNTER — Other Ambulatory Visit: Payer: Self-pay

## 2019-10-17 ENCOUNTER — Ambulatory Visit: Payer: Medicaid Other | Admitting: Occupational Therapy

## 2019-10-17 VITALS — BP 116/62 | HR 64

## 2019-10-17 DIAGNOSIS — M6281 Muscle weakness (generalized): Secondary | ICD-10-CM | POA: Diagnosis not present

## 2019-10-17 DIAGNOSIS — M79602 Pain in left arm: Secondary | ICD-10-CM | POA: Insufficient documentation

## 2019-10-17 DIAGNOSIS — R490 Dysphonia: Secondary | ICD-10-CM | POA: Insufficient documentation

## 2019-10-17 DIAGNOSIS — R278 Other lack of coordination: Secondary | ICD-10-CM | POA: Insufficient documentation

## 2019-10-17 DIAGNOSIS — I6381 Other cerebral infarction due to occlusion or stenosis of small artery: Secondary | ICD-10-CM | POA: Insufficient documentation

## 2019-10-17 DIAGNOSIS — R262 Difficulty in walking, not elsewhere classified: Secondary | ICD-10-CM | POA: Diagnosis not present

## 2019-10-17 DIAGNOSIS — R471 Dysarthria and anarthria: Secondary | ICD-10-CM | POA: Insufficient documentation

## 2019-10-17 DIAGNOSIS — R2689 Other abnormalities of gait and mobility: Secondary | ICD-10-CM | POA: Diagnosis not present

## 2019-10-17 DIAGNOSIS — R2681 Unsteadiness on feet: Secondary | ICD-10-CM | POA: Insufficient documentation

## 2019-10-17 DIAGNOSIS — I69354 Hemiplegia and hemiparesis following cerebral infarction affecting left non-dominant side: Secondary | ICD-10-CM | POA: Insufficient documentation

## 2019-10-17 NOTE — Therapy (Signed)
[place] [facility] MAIN [facility] SERVICES [address] [place], [place], [address] Phone: [phone]   Fax:  [phone]  Physical Therapy Treatment  Patient Details  Name: Evan TOLLISON Sr. MRN: [id number] Date of Birth: July 27, 1956 Referring Provider (PT): Hope Pigeon   Encounter Date: 10/17/2019  PT End of Session - 10/17/19 1021    Visit Number  3    Number of Visits  8    Date for PT Re-Evaluation  11/22/19    Authorization Type  2/10 eval 2/10; 1/3 tx session on 3/3 do goals for CAID    Authorization Time Period  authorized 9 visits 10/04/19- 12/05/19    Authorization - Visit Number  1    Authorization - Number of Visits  9    PT Start Time  1018    PT Stop Time  1100    PT Time Calculation (min)  42 min    Equipment Utilized During Treatment  Gait belt;Other (comment)   L swedish knee brace, L AFO   Activity Tolerance  Patient tolerated treatment well    Behavior During Therapy  WFL for tasks assessed/performed       Past Medical History:  Diagnosis Date  . Carotid arterial disease (HCC)    a. 08/2018 Carotid U/S: min-mod RICA atherosclerosis w/o hemodynamically significant stenosis. Nl LICA.  . Diabetes 1.5, managed as type 2 (HCC)   . Diastolic dysfunction    a. 08/2018 Echo: EF 65%. No rwma. Gr1 DD. Mild MR.  Marland Kitchen Hypercholesterolemia   . Hypertension   . Left hemiparesis (HCC)    a. Ambulated w/ Cane. Limited use of LUE.  . Pain in both feet   . Poorly controlled diabetes mellitus (HCC)    a. 04/2019 A1c 13.8.  Marland Kitchen Recurrent strokes (HCC)    a. 10/2016 MRI/A: Acute 37mm R thalamic infarct, ? subacute infarct of R corona radiata; b. 08/2017 MRI/A: Acute 56mm lateral L thalamic infarct. Other more remote lacunar infarcts of thalami bilat. Small vessel dzs; c. 08/2018 MRI/A: Acute lacunar infarct of the post limb of R internal capsule. Underlying advanced chrnoic small vessel dzs w/o hemodynamically significant stenosis.  . Tobacco abuse      Past Surgical History:  Procedure Laterality Date  . ESOPHAGOGASTRODUODENOSCOPY (EGD) WITH PROPOFOL N/A 04/26/2019   Procedure: ESOPHAGOGASTRODUODENOSCOPY (EGD) WITH PROPOFOL;  Surgeon: Toney Reil, MD;  Location: [facility] ENDOSCOPY;  Service: Gastroenterology;  Laterality: N/A;    Vitals:   10/17/19 1027  BP: 116/62  Pulse: 64  SpO2: 99%    Subjective Assessment - 10/17/19 1023    Subjective  Patient reports doing well; Denies any new falls; reports activity level has been so so; He reports walking quite a bit at home with the cane.    Pertinent History  Patient is a pleasant 64 year old male returning to PT (evaluation on 09/04/19) with PMH of HTN, HLD, Type II DM, right basal ganglia stroke (08/2018), acute CVA, sinus bradycardia, RBBB, chronic knee pain, and peripheral neuropathy. Since patient has been seen last he has been admitted to the hospital three times and went to [facility] acute inpatient rehab for left paramedian pontine and right cerebellar ischemic stroke (09/07/19) discharged 09/23/19.  Per CIR documentation patient utilizes AFO and Swedish knee cage on LLE to prevent genu recurvatum. Patient was initially evaluated prior to recent hospitalization for L hemiplegia, went to inpatient rehab in 2020 for first CVA. Has an aide at home    Limitations  Lifting;Standing;Walking;House hold activities    How long can you sit comfortably?  n/a    How long can you stand comfortably?  needs to hold onto objects    How long can you walk comfortably?  needs AD and CGA    Patient Stated Goals  to get back to fishing, walking better, balance.    Currently in Pain?  No/denies    Multiple Pain Sites  No          TREATMENT: PT assessed vitals, see above;  Patient ambulated 80 feet with tripod base cane, CGA for safety, AFO on LLE with swedish knee cage to prevent genu recuvatum; Patient does exhibit hyperextension on LLE during mid to terminal stance with uneven cadence/step  length;  Seated on mat table: Instructed patient in NDT techniques to facilitate increased motor control and weight bearing on LLE: -forward weight shift to partial stand 5 sec hold x5 reps with mod VCS and tactile cues to shift to LLE for neutral weight shift; -forward weight shift to partial stand, side/side weight shift with squat x5 reps each direction x2 sets with CGA for safety and min VCs for proper positioning;   -Forward weight shift to partial stand and then pushing through to full standing without locking knees, tactile and verbal cues for weight shift to LLE for neutral stance 2x10 reps with therapist standing to left side and facilitating increased LUE extension to help reduce flexor tone -full standing weight shift side/side x5 reps with visual cues for erect posture/trunk control; -standing weight shift to LLE with knee slightly flexed, RLE hip flexion march x5 reps x3 sets with min A for balance and cues for erect posture/weight shift to LLE and improved hip extension for better stance control;   Pt ambulated x30 feet with tripod base cane following exercise. He continues to have genu recuvatum but was able to take a few steps with better motor control of LLE knee with better stability in mid stance; Does require CGA for safety and cues for weight shift and stance control;  Leg press: BLE 40# x10 reps with min VCs for proper positioning and to avoid terminal knee extension for better quad control; LLE only 25# 2x10 reps with min A for proper positioning to avoid hip abduction/ER for better motor control;  Response to treatment: Tolerated well. Does report mild fatigue at end of session.Was able to exhibit better LLE knee control with gait following NDT exercise.                   PT Education - 10/17/19 1021    Education Details  LE strength, balance, HEP    Person(s) Educated  Patient    Methods  Explanation;Verbal cues    Comprehension  Verbalized  understanding;Returned demonstration;Verbal cues required;Need further instruction       PT Short Term Goals - 09/27/19 1653      PT SHORT TERM GOAL #1   Title  Patient will be independent with HEP for improved therapeutic gains and self-management.    Baseline  2/10: give next session    Time  4    Period  Weeks    Status  New    Target Date  10/25/19        PT Long Term Goals - 09/27/19 1653      PT LONG TERM GOAL #1   Title  Patient will increase Berg Balance score by > 6 points (30/56) to demonstrate decreased fall risk during functional activities.  Baseline  2/10: 24/56    Time  8    Period  Weeks    Status  New    Target Date  11/22/19      PT LONG TERM GOAL #2   Title  Patient will increase 10 meter walk test to >1.46m/s as to improve gait speed for better community ambulation and to reduce fall risk.    Baseline  2/10: 0.31 m/s with hurrycane and w/c follow    Time  8    Period  Weeks    Status  New    Target Date  11/22/19      PT LONG TERM GOAL #3   Title  Patient (> 52 years old) will complete five times sit to stand test in < 15 seconds with no UE support indicating an increased LE strength and improved balance.    Baseline  2/10: 17 seconds heavy RUE support and use only of RLE    Time  8    Period  Weeks    Status  New    Target Date  11/22/19      PT LONG TERM GOAL #4   Title  Patient will increase BLE gross strength to 4+/5 as to improve functional strength for independent gait, increased standing tolerance and increased ADL ability.    Baseline  2/10: see evaluation note    Time  8    Period  Weeks    Status  New    Target Date  11/22/19            Plan - 10/17/19 1100    Clinical Impression Statement  Patient motivated and participated well within session. Instructed patient in NDT exercise to facilitate increased weight shift to LLE and improve motor control of LLE knee to prevent knee hyperextension. Patient required mod VCs and tactile  cues for proper exercise technique. He tolerated well and was able to exhibit improved gait mechanics following exercise. Initiated LLE strengthening with leg press. He does require min A for proper positioning and exercise technique for better motor control. He would benefit from additional skilled PT intervention to improve strength, balance and mobility;    Personal Factors and Comorbidities  Age;Education;Sex;Social Background;Behavior Pattern;Finances;Past/Current Experience;Time since onset of injury/illness/exacerbation;Comorbidity 3+;Transportation    Comorbidities  PMH of HTN, HLD, Type II DM, right basal ganglia stroke (08/2018), acute CVA, sinus bradycardia, RBBB, chronic knee pain, and peripheral neuropathy    Examination-Activity Limitations  Bathing;Bed Mobility;Dressing;Transfers;Squat;Lift;Locomotion Level;Stairs;Reach Overhead;Carry;Stand;Sit    Examination-Participation Restrictions  Interpersonal Relationship;Yard Work;Laundry;Cleaning;Community Activity;Meal Prep;Medication Management;Driving;Shop;Volunteer    Stability/Clinical Decision Making  Unstable/Unpredictable    Rehab Potential  Fair    PT Frequency  1x / week    PT Duration  8 weeks    PT Treatment/Interventions  ADLs/Self Care Home Management;Aquatic Therapy;Moist Heat;Cryotherapy;Gait training;Stair training;Functional mobility training;Therapeutic activities;Therapeutic exercise;Patient/family education;Neuromuscular re-education;Balance training;Orthotic Fit/Training;Manual techniques;Passive range of motion;Dry needling;Taping;Energy conservation;Splinting;Joint Manipulations;Spinal Manipulations;Electrical Stimulation;Ultrasound;Vestibular;Biofeedback;Iontophoresis 4mg /ml Dexamethasone;DME Instruction;Visual/perceptual remediation/compensation    PT Next Visit Plan  HEP, // bars, safety awareness    PT Home Exercise Plan  give next one    Consulted and Agree with Plan of Care  Patient;Family member/caregiver    Family  Member Consulted  wife       Patient will benefit from skilled therapeutic intervention in order to improve the following deficits and impairments:  Abnormal gait, Decreased balance, Decreased endurance, Decreased mobility, Difficulty walking, Hypomobility, Increased muscle spasms, Impaired tone, Decreased range of motion, Decreased coordination, Decreased strength, Impaired UE  functional use, Pain, Decreased activity tolerance, Decreased safety awareness, Impaired perceived functional ability, Impaired sensation, Improper body mechanics, Postural dysfunction, Decreased knowledge of use of DME, Decreased knowledge of precautions, Impaired flexibility, Impaired vision/preception  Visit Diagnosis: Muscle weakness (generalized)  Unsteadiness on feet  Other abnormalities of gait and mobility  Hemiplegia and hemiparesis following cerebral infarction affecting left non-dominant side (HCC)  Other lack of coordination     Problem List Patient Active Problem List   Diagnosis Date Noted  . Chest pain of uncertain etiology 24/04/7352  . History of CVA (cerebrovascular accident) 09/05/2019  . Hyperglycemia 09/05/2019  . Anemia 09/05/2019  . AKI (acute kidney injury) ([place]) 09/05/2019  . Atypical pneumonia 09/05/2019  . Coagulation disorder ([place]) 06/12/2019  . Gastroesophageal reflux disease   . Hemiplegia and hemiparesis following cerebral infarction affecting left non-dominant side ([place]) 11/25/2018  . 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 RBBB 10/15/2018  . Sinus bradycardia 10/15/2018  . Dysphagia, post-stroke   . Neuropathic pain   . Lacunar infarct, acute ([place]) 09/06/2018  . TIA (transient ischemic attack) 09/01/2018  . Microalbuminuria 07/29/2018  . Left leg pain 01/13/2018  . Hyperlipidemia 02/11/2017  . Recurrent strokes ([place]) 11/10/2016  . Essential hypertension 11/10/2016  . Diabetes ([place]) 11/10/2016  . Tobacco abuse 08/28/2013    Indria Bishara PT, DPT 10/17/2019, 11:05 AM  [place] MAIN [facility] SERVICES [address] [place], [place], [address] Phone: [phone]   Fax:  [phone]  Name: Evan KORINEK Sr. MRN: [id number] Date of Birth: 1956-07-18

## 2019-10-17 NOTE — Therapy (Signed)
[place] MAIN [facility] SERVICES [address] [place], [place], [address] Phone: [phone]   Fax:  [phone]  Occupational Therapy Treatment  Patient Details  Name: Evan LOFTIN Sr. MRN: [id number] Date of Birth: 08/06/1956 Referring Provider (OT): Sharlene Dory   Encounter Date: 10/17/2019  OT End of Session - 10/17/19 1202    Visit Number  4    Number of Visits  24    Date for OT Re-Evaluation  12/20/19    OT Start Time  1100    OT Stop Time  1145    OT Time Calculation (min)  45 min    Activity Tolerance  Patient tolerated treatment well    Behavior During Therapy  [facility] for tasks assessed/performed       Past Medical History:  Diagnosis Date  . Carotid arterial disease ([place])    a. 08/2018 Carotid U/S: min-mod RICA atherosclerosis w/o hemodynamically significant stenosis. Nl LICA.  . Diabetes 1.5, managed as type 2 ([place])   . Diastolic dysfunction    a. 08/2018 Echo: EF 65%. No rwma. Gr1 DD. Mild MR.  Marland Kitchen Hypercholesterolemia   . Hypertension   . Left hemiparesis ([place])    a. Ambulated w/ Cane. Limited use of LUE.  . Pain in both feet   . Poorly controlled diabetes mellitus ([place])    a. 04/2019 A1c 13.8.  Marland Kitchen Recurrent strokes ([place])    a. 10/2016 MRI/A: Acute 31mm R thalamic infarct, ? subacute infarct of R corona radiata; b. 08/2017 MRI/A: Acute 26mm lateral L thalamic infarct. Other more remote lacunar infarcts of thalami bilat. Small vessel dzs; c. 08/2018 MRI/A: Acute lacunar infarct of the post limb of R internal capsule. Underlying advanced chrnoic small vessel dzs w/o hemodynamically significant stenosis.  . Tobacco abuse     Past Surgical History:  Procedure Laterality Date  . ESOPHAGOGASTRODUODENOSCOPY (EGD) WITH PROPOFOL N/A 04/26/2019   Procedure: ESOPHAGOGASTRODUODENOSCOPY (EGD) WITH PROPOFOL;  Surgeon: Lin Landsman, MD;  Location: [facility];  Service: Gastroenterology;  Laterality: N/A;    There were no vitals filed  for this visit.  Subjective Assessment - 10/17/19 1200    Patient is accompanied by:  Family member    Pertinent History  Pt. is a 64 y.o. male with a history of a CVA with left hemiplegia one year ago was admitted to [facility] on 09/04/2019 with Atypical Pneumonia, Diabetic Ketoacidosis without coma. Pt. was then admitted to [facility] received inpatient rehabilitation services. Pt. resides with his wife who assists with all ADLs, and IADLs. Pt. was previously indepedent with ADL tasks, worked in Nurse, learning disability, and enjoys fishing.    Limitations  LUE functioning, ADLs, and IADLs    Currently in Pain?  No/denies       OT TREATMENT   Neuro muscular re-education:  Pt. worked on weightbearing, and proprioceptive input through the LUE, and hand To inhibit tone, and prepare the UE for ROM, and facilitation of active movement. Pt. worked on weightbearing in side sitting at the edge of the mat with slow rocking forward, and pushing up into sitting with his LUE, and hand. Pt. also worked on weightbearing with a flat open hand, and preparing the hand for weightbearing position. Pt. Worked on reaching reaching of cones, leaning forward with the trunk to place them onto a low surface. Pt. worked on extending his digits to release the cones. Pt. Presented with more active digit extension initially, decreasing as the task progressed, and pt. fatigued.  Therapeutic Exercise:  Pt. tolerated PROM in all joint ranges of the LUE, and hand with slow gentle stretching is tone reducing patterns. Pt. education was provided about self-ROM using the RUE to assist with ROM.  Manual Therapy:  Pt. Tolerated scapular mobilization in elevation, depression, and rotation while in sidelying to prepare the LUE for ROM. Pt. Tolerated soft tissue mobilizations metacarpal spread stretches to the left hand prior to ROM. Manual techniques were performed prior to, and independent of ROM/ther. Ex.   Pt. continues to present with  increased flexor tone, tightness, and spasticity in the LUE. Pt. responds well to inhibitory techniques through the LU, and hand. Pt. was able to initiate active digit extension responses when attempting to grasp, and release objects. However pt. fatigues quickly. Pt. continues to work on normalizing tone in the LUE, and hand, and facilitating active volitional movement in preparation for functional hand use during ADLs, and IADL tasks. Pt. was encouraged to find opportunities at home for iengaging his hand during ADL tasks.                      OT Education - 10/17/19 1201    Education Details  LUE ROM    Person(s) Educated  Patient    Methods  Explanation    Comprehension  Verbalized understanding;Returned demonstration          OT Long Term Goals - 09/27/19 1711      OT LONG TERM GOAL #1   Title  Pt. will be independent with donning his shirt using one armed dressing techniques    Baseline  Eval: Pt. requires assist form caregiver    Time  12    Period  Weeks    Status  New    Target Date  12/20/19      OT LONG TERM GOAL #2   Title  Pt. will donn LE dressing with modified independence    Baseline  Eval: MaxA    Time  12    Period  Weeks    Status  New    Target Date  12/20/19      OT LONG TERM GOAL #3   Title  Pt. will improve left digit flexion to be able to hold a toothbrush while applying toothpaste.    Baseline  Eval: Pt. is unable    Time  12    Period  Weeks    Status  New    Target Date  12/20/19      OT LONG TERM GOAL #4   Title  Pt. will improve left shoulder flexion,and abduction AROM by 20 degrees to be able to reach his head in preparation for brushing his hair.    Baseline  Eval: Pt. is unable    Time  12    Period  Weeks    Status  New    Target Date  12/20/19      OT LONG TERM GOAL #5   Title  Pt. will be independent with HEP for the LUE, and hand    Baseline  Eval: Pt. does not have a HEP    Time  12    Period  Weeks    Status   New    Target Date  12/20/19            Plan - 10/17/19 1202    Clinical Impression Statement  Pt. continues to present with increased flexor tone, tightness, and spasticity in the LUE. Pt. responds  well to inhibitory techniques through the LU, and hand. Pt. was able to initiate active digit extension responses when attempting to grasp, and release objects. However pt. fatigues quickly. Pt. continues to work on normalizing tone in the LUE, and hand, and facilitating active volitional movement in preparation for functional hand use during ADLs, and IADL tasks. Pt. was encouraged to find opportunities at home for iengaging his hand during ADL tasks.    OT Occupational Profile and History  Detailed Assessment- Review of Records and additional review of physical, cognitive, psychosocial history related to current functional performance    Occupational performance deficits (Please refer to evaluation for details):  ADL's;IADL's    Body Structure / Function / Physical Skills  ADL;GMC;Strength;IADL;FMC    Rehab Potential  Good    Clinical Decision Making  Multiple treatment options, significant modification of task necessary    Modification or Assistance to Complete Evaluation   Max significant modification of tasks or assist is necessary to complete    OT Frequency  2x / week    OT Duration  12 weeks    OT Treatment/Interventions  Self-care/ADL training;Therapeutic exercise;Patient/family education;DME and/or AE instruction;Neuromuscular education;Manual Therapy;Therapeutic activities;Splinting;Moist Heat;Electrical Stimulation    Consulted and Agree with Plan of Care  Patient;Family member/caregiver       Patient will benefit from skilled therapeutic intervention in order to improve the following deficits and impairments:   Body Structure / Function / Physical Skills: ADL, GMC, Strength, IADL, [facility]       Visit Diagnosis: Muscle weakness (generalized)    Problem List Patient Active  Problem List   Diagnosis Date Noted  . Chest pain of uncertain etiology 09/05/2019  . History of CVA (cerebrovascular accident) 09/05/2019  . Hyperglycemia 09/05/2019  . Anemia 09/05/2019  . AKI (acute kidney injury) (HCC) 09/05/2019  . Atypical pneumonia 09/05/2019  . Coagulation disorder (HCC) 06/12/2019  . Gastroesophageal reflux disease   . Hemiplegia and hemiparesis following cerebral infarction affecting left non-dominant side (HCC) 11/25/2018  . 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 RBBB 10/15/2018  . Sinus bradycardia 10/15/2018  . Dysphagia, post-stroke   . Neuropathic pain   . Lacunar infarct, acute (HCC) 09/06/2018  . TIA (transient ischemic attack) 09/01/2018  . Microalbuminuria 07/29/2018  . Left leg pain 01/13/2018  . Hyperlipidemia 02/11/2017  . Recurrent strokes (HCC) 11/10/2016  . Essential hypertension 11/10/2016  . Diabetes (HCC) 11/10/2016  . Tobacco abuse 08/28/2013    Olegario Messier, MS, OTR/L 10/17/2019, 12:12 PM  [place] [facility] MAIN [facility] SERVICES [address] [place], [place], [address] Phone: [phone]   Fax:  [phone]  Name: Evan PANTALEO Sr. MRN: [id number] Date of Birth: 1956-07-15

## 2019-10-19 ENCOUNTER — Ambulatory Visit: Payer: Medicaid Other | Admitting: Podiatry

## 2019-10-20 ENCOUNTER — Encounter: Payer: Medicaid Other | Admitting: Occupational Therapy

## 2019-10-20 ENCOUNTER — Encounter: Payer: Self-pay | Admitting: Speech Pathology

## 2019-10-20 ENCOUNTER — Ambulatory Visit: Payer: Medicaid Other | Admitting: Speech Pathology

## 2019-10-20 ENCOUNTER — Other Ambulatory Visit: Payer: Self-pay

## 2019-10-20 ENCOUNTER — Ambulatory Visit: Payer: Medicaid Other

## 2019-10-20 DIAGNOSIS — R2689 Other abnormalities of gait and mobility: Secondary | ICD-10-CM | POA: Diagnosis not present

## 2019-10-20 DIAGNOSIS — M79602 Pain in left arm: Secondary | ICD-10-CM | POA: Diagnosis not present

## 2019-10-20 DIAGNOSIS — M6281 Muscle weakness (generalized): Secondary | ICD-10-CM | POA: Diagnosis not present

## 2019-10-20 DIAGNOSIS — R490 Dysphonia: Secondary | ICD-10-CM

## 2019-10-20 DIAGNOSIS — I69354 Hemiplegia and hemiparesis following cerebral infarction affecting left non-dominant side: Secondary | ICD-10-CM | POA: Diagnosis not present

## 2019-10-20 DIAGNOSIS — I6381 Other cerebral infarction due to occlusion or stenosis of small artery: Secondary | ICD-10-CM | POA: Diagnosis not present

## 2019-10-20 DIAGNOSIS — R262 Difficulty in walking, not elsewhere classified: Secondary | ICD-10-CM | POA: Diagnosis not present

## 2019-10-20 DIAGNOSIS — R471 Dysarthria and anarthria: Secondary | ICD-10-CM

## 2019-10-20 DIAGNOSIS — R2681 Unsteadiness on feet: Secondary | ICD-10-CM | POA: Diagnosis not present

## 2019-10-20 DIAGNOSIS — R278 Other lack of coordination: Secondary | ICD-10-CM | POA: Diagnosis not present

## 2019-10-20 NOTE — Therapy (Signed)
[place] [facility] MAIN [facility] SERVICES [address] [place], [place], [address] Phone: [phone]   Fax:  [phone]  Speech Language Pathology Treatment  Patient Details  Name: Evan ASHER Sr. MRN: [id number] Date of Birth: [birth year] Referring Provider (SLP): Franz Dell   Encounter Date: 10/20/2019  End of Session - 10/20/19 1051    Visit Number  1    Number of Visits  8    Date for SLP Re-Evaluation  12/15/19    Authorization Type  Medicaid    Authorization - Visit Number  1    Authorization - Number of Visits  10    SLP Start Time  0900    SLP Stop Time   0950    SLP Time Calculation (min)  50 min    Activity Tolerance  Patient tolerated treatment well       Past Medical History:  Diagnosis Date  . Carotid arterial disease (HCC)    a. 08/2018 Carotid U/S: min-mod RICA atherosclerosis w/o hemodynamically significant stenosis. Nl LICA.  . Diabetes 1.5, managed as type 2 (HCC)   . Diastolic dysfunction    a. 08/2018 Echo: EF 65%. No rwma. Gr1 DD. Mild MR.  Marland Kitchen Hypercholesterolemia   . Hypertension   . Left hemiparesis ([facility])    a. Ambulated w/ Cane. Limited use of LUE.  . Pain in both feet   . Poorly controlled diabetes mellitus ([facility])    a. 04/2019 A1c 13.8.  Marland Kitchen Recurrent strokes ([facility])    a. 10/2016 MRI/A: Acute 2mm R thalamic infarct, ? subacute infarct of R corona radiata; b. 08/2017 MRI/A: Acute 90mm lateral L thalamic infarct. Other more remote lacunar infarcts of thalami bilat. Small vessel dzs; c. 08/2018 MRI/A: Acute lacunar infarct of the post limb of R internal capsule. Underlying advanced chrnoic small vessel dzs w/o hemodynamically significant stenosis.  . Tobacco abuse     Past Surgical History:  Procedure Laterality Date  . ESOPHAGOGASTRODUODENOSCOPY (EGD) WITH PROPOFOL N/A 04/26/2019   Procedure: ESOPHAGOGASTRODUODENOSCOPY (EGD) WITH PROPOFOL;  Surgeon: Toney Reil, MD;  Location: [facility];  Service:  Gastroenterology;  Laterality: N/A;    There were no vitals filed for this visit.  Subjective Assessment - 10/20/19 0001    Subjective  "I can't talk like I used to"        SLP Evaluation [facility] - 10/20/19 0001      SLP Visit Information   SLP Received On  10/20/19    Referring Provider (SLP)  Franz Dell    Onset Date  09/12/2019    Medical Diagnosis  CVA      Subjective   Subjective  "Can't talk like I used to"    Patient/Family Stated Goal  Communicate effectively      Pain Assessment   Currently in Pain?  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  He was referred to speech therapy by Dr. Maurice Small on 08/09/2019: "Pt wife would like a order for her husband to having PT to learn how to walking and use his hands and speech therapy to learn had to talk again. Pt had a stroke." Patient presents with mild dysarthria secondary to CVA. The thalamic stroke occurred on September 01, 2018, and the patient suffered the second pontine infarct on September 12, 2019. Patient did  receive some inpatient SLP services immediately following the CVA in January 2020 for dysphagia, mild cognitive deficits and moderate dysarthria. Patient's medical history is significant for multiple infarcts, including one March 2018 and one in February 2019. Patient was independent prior to the CVA in January 2020.      Prior Functional Status   Cognitive/Linguistic Baseline  Within functional limits      Auditory Comprehension   Overall Auditory Comprehension  Appears within functional limits for tasks assessed      Expression   Primary Mode of Expression  Verbal      Oral Motor/Sensory Function   Overall Oral Motor/Sensory Function  Impaired    Labial ROM  Reduced left    Labial Symmetry  WFL   Labial Strength  Reduced Left    Labial Sensation  Reduced Right    Labial  Coordination  Reduced    Lingual ROM  Within Functional Limits    Lingual Symmetry  Within Functional Limits    Lingual Strength  Within Functional Limits    Lingual Sensation  Within Functional Limits    Lingual Coordination  Reduced    Facial ROM  Reduced left    Facial Symmetry  Left droop    Facial Strength  Reduced    Facial Sensation  Within Functional Limits    Facial Coordination  Reduced    Velum  Within Functional Limits    Mandible  Impaired    Overall Oral Motor/Sensory Function  Impaired      Motor Speech   Overall Motor Speech  Impaired    Respiration  Impaired    Level of Impairment  Phrase    Phonation  Wet;Hoarse;Low vocal intensity    Resonance  Within functional limits    Articulation  Impaired    Level of Impairment  Word    Intelligibility  Intelligibility reduced    Word  50-74% accurate    Phrase  25-49% accurate    Sentence  0-24% accurate    Conversation  0-24% accurate    Motor Planning  Impaired    Level of Impairment  Word    Motor Speech Errors  Aware;Inconsistent    Effective Techniques  Over-articulate;Increased vocal intensity;Slow rate    Phonation  Impaired    Vocal Abuses  Glottal Attack;Smoking    Tension Present  Jaw;Neck    Volume  Soft    Pitch  Low      Standardized Assessments   Standardized Assessments   Other Assessment   Oral Motor/Motor Speech Evaluation      Motor Speech Evaluation Lips: Decreased ROM, strength within normal limits, moderately decreased planning/coordination for rapid alternating movements, moderately decreased planning and coordination for one-step oral motor tasks.  Tongue: ROM and strength within normal limits, moderately decreased planning/coordination for rapid alternating movements.  Jaw: Decreased ROM on excursion and lateralization, strength within normal limits.  Soft palate: Within normal limits.  Oral agility: Moderately decreased planning/coordination for rapid alternating movements,  moderate difficulty coordinating oral-motor tasks.  Sensory: Within normal limits, some numbness on the right side of lips.  Voice: Moderate-severe strained/strangled vocal quality.  Respiration: Clavicular breathing noted, reduced words-per-breath.   Resonance: Within normal limits  Intelligibility: The patient has moderately decreased intelligibility. The general content of the message can be understood with some difficulty. The patient demonstrates numerous articulation errors in addition to faulty coordination of respiration and laryngeal tightness. The patient's severe hoarseness and wet vocal quality impede intelligibility.  Patient Education:  The  patient was provided with written and verbal teaching regarding vocal hygiene.  The patient was provided with written and verbal teaching regarding neck, shoulder, tongue, and throat stretches exercises to promote relaxed phonation.  The patient was provided with written and verbal teaching regarding breath support exercises.  The patient was instructed in the need for relaxed posture to improve airflow and breath support.  Flow Phonation Skill Level One: establish airflow release: Unarticulated: Patient able to generate multiple samples of unarticulated airflow without stoppage due to tension.       SLP Education - 10/20/19 1051    Education Details  Results and recommendations    Person(s) Educated  Patient    Methods  Explanation    Comprehension  Verbalized understanding         SLP Long Term Goals - 10/20/19 1055      SLP LONG TERM GOAL #1   Title  The patient will demonstrate independent completion of extrinsic laryngeal muscle stretches.    Baseline  Patient very tight, requires supervision for accurate performance    Time  8    Period  Weeks    Status  New    Target Date  12/15/19      SLP LONG TERM GOAL #2   Title  The patient will be independent for abdominal breathing and breath support exercises.    Baseline   Patient requires max cues to execute accurately    Time  8    Period  Weeks    Status  New    Target Date  12/15/19      SLP LONG TERM GOAL #3   Title  The patient will minimize vocal tension via flow phonation and/or resonant voice therapy (or comparable techniques) with min SLP cues with 80% accuracy.    Baseline  Tight/strained vocal quality    Time  8    Period  Weeks    Status  New    Target Date  12/15/19      SLP LONG TERM GOAL #4   Title  Pt will improve speech intelligibility for sentences by controlling rate of speech, over-articulation, and increased loudness to achieve 80% intelligibility.    Baseline  Patient cannot increase volume without vocal strain    Time  8    Period  Weeks    Status  New    Target Date  12/15/19       Plan - 10/20/19 1052    Clinical Impression Statement  At one month post onset of an acute cerebellar infarct, the patient presents with mild-moderate dysarthria characterized by imprecise articulation and strained vocal quality with limited pitch and volume control. His poor motor control and recent onset of drooling is suspected to be due to mild apraxia. He will benefit from skilled speech therapy for education, to improve speech intelligibility, improve breath support, promote easy flow phonation, and learn techniques to increase loudness and pitch range without strain.    Speech Therapy Frequency  1x /week    Duration  Other (comment)   6 weeks   Potential to Achieve Goals  Good    Potential Considerations  Ability to learn/carryover information;Previous level of function;Severity of impairments;Cooperation/participation level;Family/community support    SLP Home Exercise Plan  Provided    Consulted and Agree with Plan of Care  Patient;Family member/caregiver    Family Member Consulted  Spouse       Patient will benefit from skilled therapeutic intervention in order to improve the following  deficits and impairments:   Dysarthria and  anarthria - Plan: SLP plan of care cert/re-cert  Dysphonia - Plan: SLP plan of care cert/re-cert    Problem List Patient Active Problem List   Diagnosis Date Noted  . Chest pain of uncertain etiology 09/05/2019  . History of CVA (cerebrovascular accident) 09/05/2019  . Hyperglycemia 09/05/2019  . Anemia 09/05/2019  . AKI (acute kidney injury) (HCC) 09/05/2019  . Atypical pneumonia 09/05/2019  . Coagulation disorder (HCC) 06/12/2019  . Gastroesophageal reflux disease   . Hemiplegia and hemiparesis following cerebral infarction affecting left non-dominant side (HCC) 11/25/2018  . 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 RBBB 10/15/2018  . Sinus bradycardia 10/15/2018  . Dysphagia, post-stroke   . Neuropathic pain   . Lacunar infarct, acute (HCC) 09/06/2018  . TIA (transient ischemic attack) 09/01/2018  . Microalbuminuria 07/29/2018  . Left leg pain 01/13/2018  . Hyperlipidemia 02/11/2017  . Recurrent strokes (HCC) 11/10/2016  . Essential hypertension 11/10/2016  . Diabetes (HCC) 11/10/2016  . Tobacco abuse 08/28/2013    Leandrew Koyanagi 10/20/2019, 2:07 PM  [facility] MAIN [facility] SERVICES [address] [place], [place], [address] Phone: [phone]   Fax:  [phone]   Name: Evan MALLOZZI Sr. MRN: [id number] Date of Birth: September 14, 1955

## 2019-10-25 ENCOUNTER — Other Ambulatory Visit: Payer: Self-pay

## 2019-10-25 ENCOUNTER — Ambulatory Visit: Payer: Medicaid Other

## 2019-10-25 ENCOUNTER — Encounter: Payer: Self-pay | Admitting: Occupational Therapy

## 2019-10-25 ENCOUNTER — Ambulatory Visit: Payer: Medicaid Other | Admitting: Occupational Therapy

## 2019-10-25 VITALS — BP 125/70 | HR 51

## 2019-10-25 DIAGNOSIS — M79602 Pain in left arm: Secondary | ICD-10-CM | POA: Diagnosis not present

## 2019-10-25 DIAGNOSIS — R2689 Other abnormalities of gait and mobility: Secondary | ICD-10-CM

## 2019-10-25 DIAGNOSIS — I6381 Other cerebral infarction due to occlusion or stenosis of small artery: Secondary | ICD-10-CM | POA: Diagnosis not present

## 2019-10-25 DIAGNOSIS — R2681 Unsteadiness on feet: Secondary | ICD-10-CM | POA: Diagnosis not present

## 2019-10-25 DIAGNOSIS — M6281 Muscle weakness (generalized): Secondary | ICD-10-CM

## 2019-10-25 DIAGNOSIS — E1165 Type 2 diabetes mellitus with hyperglycemia: Secondary | ICD-10-CM | POA: Diagnosis not present

## 2019-10-25 DIAGNOSIS — I69354 Hemiplegia and hemiparesis following cerebral infarction affecting left non-dominant side: Secondary | ICD-10-CM | POA: Diagnosis not present

## 2019-10-25 DIAGNOSIS — R278 Other lack of coordination: Secondary | ICD-10-CM

## 2019-10-25 DIAGNOSIS — R471 Dysarthria and anarthria: Secondary | ICD-10-CM | POA: Diagnosis not present

## 2019-10-25 DIAGNOSIS — R490 Dysphonia: Secondary | ICD-10-CM | POA: Diagnosis not present

## 2019-10-25 DIAGNOSIS — R262 Difficulty in walking, not elsewhere classified: Secondary | ICD-10-CM | POA: Diagnosis not present

## 2019-10-25 NOTE — Therapy (Signed)
[place] [facility] MAIN [facility] SERVICES [address] [place], [place], [address] Phone: [phone]   Fax:  [phone]  Physical Therapy Treatment  Patient Details  Name: Evan Townsend Sr. MRN: [id number] Date of Birth: Jan 14, 1956 Referring Provider (PT): Hope Pigeon   Encounter Date: 10/25/2019  PT End of Session - 10/25/19 0934    Visit Number  4    Number of Visits  8    Date for PT Re-Evaluation  11/22/19    Authorization Type  2/10 eval 2/10; 1/3 tx session on 3/3 do goals for CAID    Authorization Time Period  authorized 9 visits 10/04/19- 12/05/19    Authorization - Visit Number  2    Authorization - Number of Visits  9    PT Start Time  0940    PT Stop Time  1015    PT Time Calculation (min)  35 min    Equipment Utilized During Treatment  Gait belt;Other (comment)   L swedish knee brace, AFO   Activity Tolerance  Patient tolerated treatment well    Behavior During Therapy  [facility] for tasks assessed/performed       Past Medical History:  Diagnosis Date  . Carotid arterial disease (HCC)    a. 08/2018 Carotid U/S: min-mod RICA atherosclerosis w/o hemodynamically significant stenosis. Nl LICA.  . Diabetes 1.5, managed as type 2 (HCC)   . Diastolic dysfunction    a. 08/2018 Echo: EF 65%. No rwma. Gr1 DD. Mild MR.  Marland Kitchen Hypercholesterolemia   . Hypertension   . Left hemiparesis (HCC)    a. Ambulated w/ Cane. Limited use of LUE.  . Pain in both feet   . Poorly controlled diabetes mellitus (HCC)    a. 04/2019 A1c 13.8.  Marland Kitchen Recurrent strokes (HCC)    a. 10/2016 MRI/A: Acute 12mm R thalamic infarct, ? subacute infarct of R corona radiata; b. 08/2017 MRI/A: Acute 29mm lateral L thalamic infarct. Other more remote lacunar infarcts of thalami bilat. Small vessel dzs; c. 08/2018 MRI/A: Acute lacunar infarct of the post limb of R internal capsule. Underlying advanced chrnoic small vessel dzs w/o hemodynamically significant stenosis.  . Tobacco abuse      Past Surgical History:  Procedure Laterality Date  . ESOPHAGOGASTRODUODENOSCOPY (EGD) WITH PROPOFOL N/A 04/26/2019   Procedure: ESOPHAGOGASTRODUODENOSCOPY (EGD) WITH PROPOFOL;  Surgeon: Toney Reil, MD;  Location: [facility] ENDOSCOPY;  Service: Gastroenterology;  Laterality: N/A;    Vitals:   10/25/19 0940  BP: 125/70  Pulse: (!) 51    Subjective Assessment - 10/25/19 0933    Subjective  Patient state he is doing well; he left his knee brace in the car, does not like to wear it and he thinks that it makes his knee go backwards. Pt also stated his oher shoes decreases how much his knee goes backwards.    Pertinent History  Patient is a pleasant 64 year old male returning to PT (evaluation on 09/04/19) with PMH of HTN, HLD, Type II DM, right basal ganglia stroke (08/2018), acute CVA, sinus bradycardia, RBBB, chronic knee pain, and peripheral neuropathy. Since patient has been seen last he has been admitted to the hospital three times and went to [facility] acute inpatient rehab for left paramedian pontine and right cerebellar ischemic stroke (09/07/19) discharged 09/23/19.  Per CIR documentation patient utilizes AFO and Swedish knee cage on LLE to prevent genu recurvatum. Patient was initially evaluated prior to recent hospitalization for L hemiplegia, went to inpatient rehab in  2020 for first CVA. Has an aide at home    Limitations  Lifting;Standing;Walking;House hold activities    How long can you sit comfortably?  n/a    How long can you stand comfortably?  needs to hold onto objects    How long can you walk comfortably?  needs AD and CGA    Patient Stated Goals  to get back to fishing, walking better, balance.    Currently in Pain?  No/denies       TREATMENT: PT assessed vitals, see above;   NMR: Patient ambulated 50 feet with tripod base cane, CGA for safety, AFO on LLE, Patient does exhibit hyperextension on LLE during mid to terminal stance with uneven cadence/step length;   Seated on  mat table: Sit to stands 10x4 sets; seated rest break between ea sets. Cues for weight shift anteriorly and to the L, RLE placement (placed R foot more anteriorly to L to increase L weight bearing) PT provided knee block posteriorly and anteriorly, as well as physical assist to facilitate weight shift to L; decreased tactile/facilitation with ea set to assess pt's ability to maintain form/technique  Standing weight shifts laterally, PT facilitation of weight shift to L 3x12 with seated rest breaks between sets. PT assist for posterior knee block, decreased cueing with repetition with fair patient carryover to limit knee hyperextension  Standing weight shifts forward/backward: 2x10 LLE placed in front of RLE in step forward position, RUE supported on mat. PT facilitation of L weight shift, block of L knee to prevent buckling/hyperextension, decreased facilitation with ea set   Pt ambulated x60 feet with tripod base cane following exercise. He continues to have genu recuvatum but exhibited 2-3 instances of improved ability to take steps and motor control of LLE.  Does require CGA for safety and cues for weight shift and stance control;   LAQ of LLE with assist with opposite leg/PT assist 2x10 throughout session, imformed to add to HEP.  Pt response/clinical impression: Pt exhibited excellent motivation during session. Some improvement noted after facilitation in gait, though pt does revert back to hyperextension of LLE with fatigue/extended walking. The patient would benefit from further skilled PT to continue to progress towards goals to maximize mobility, safety, balance, and gait.    PT Education - 10/25/19 0933    Education Details  LE strength, balance, HEP    Person(s) Educated  Patient    Methods  Explanation;Verbal cues    Comprehension  Verbalized understanding;Returned demonstration;Verbal cues required;Need further instruction       PT Short Term Goals - 09/27/19 1653      PT  SHORT TERM GOAL #1   Title  Patient will be independent with HEP for improved therapeutic gains and self-management.    Baseline  2/10: give next session    Time  4    Period  Weeks    Status  New    Target Date  10/25/19        PT Long Term Goals - 09/27/19 1653      PT LONG TERM GOAL #1   Title  Patient will increase Berg Balance score by > 6 points (30/56) to demonstrate decreased fall risk during functional activities.    Baseline  2/10: 24/56    Time  8    Period  Weeks    Status  New    Target Date  11/22/19      PT LONG TERM GOAL #2   Title  Patient will increase  10 meter walk test to >1.81m/s as to improve gait speed for better community ambulation and to reduce fall risk.    Baseline  2/10: 0.31 m/s with hurrycane and w/c follow    Time  8    Period  Weeks    Status  New    Target Date  11/22/19      PT LONG TERM GOAL #3   Title  Patient (> 93 years old) will complete five times sit to stand test in < 15 seconds with no UE support indicating an increased LE strength and improved balance.    Baseline  2/10: 17 seconds heavy RUE support and use only of RLE    Time  8    Period  Weeks    Status  New    Target Date  11/22/19      PT LONG TERM GOAL #4   Title  Patient will increase BLE gross strength to 4+/5 as to improve functional strength for independent gait, increased standing tolerance and increased ADL ability.    Baseline  2/10: see evaluation note    Time  8    Period  Weeks    Status  New    Target Date  11/22/19            Plan - 10/25/19 0934    Clinical Impression Statement  Pt exhibited excellent motivation during session. Some improvement noted after facilitation in gait, though pt does revert back to hyperextension of LLE with fatigue/extended walking. The patient would benefit from further skilled PT to continue to progress towards goals to maximize mobility, safety, balance, and gait.    Personal Factors and Comorbidities   Age;Education;Sex;Social Background;Behavior Pattern;Finances;Past/Current Experience;Time since onset of injury/illness/exacerbation;Comorbidity 3+;Transportation    Comorbidities  PMH of HTN, HLD, Type II DM, right basal ganglia stroke (08/2018), acute CVA, sinus bradycardia, RBBB, chronic knee pain, and peripheral neuropathy    Examination-Activity Limitations  Bathing;Bed Mobility;Dressing;Transfers;Squat;Lift;Locomotion Level;Stairs;Reach Overhead;Carry;Stand;Sit    Examination-Participation Restrictions  Interpersonal Relationship;Yard Work;Laundry;Cleaning;Community Activity;Meal Prep;Medication Management;Driving;Shop;Volunteer    Stability/Clinical Decision Making  Unstable/Unpredictable    Rehab Potential  Fair    PT Frequency  1x / week    PT Duration  8 weeks    PT Treatment/Interventions  ADLs/Self Care Home Management;Aquatic Therapy;Moist Heat;Cryotherapy;Gait training;Stair training;Functional mobility training;Therapeutic activities;Therapeutic exercise;Patient/family education;Neuromuscular re-education;Balance training;Orthotic Fit/Training;Manual techniques;Passive range of motion;Dry needling;Taping;Energy conservation;Splinting;Joint Manipulations;Spinal Manipulations;Electrical Stimulation;Ultrasound;Vestibular;Biofeedback;Iontophoresis 4mg /ml Dexamethasone;DME Instruction;Visual/perceptual remediation/compensation    PT Next Visit Plan  HEP, // bars, safety awareness    PT Home Exercise Plan  give next one    Consulted and Agree with Plan of Care  Patient;Family member/caregiver       Patient will benefit from skilled therapeutic intervention in order to improve the following deficits and impairments:  Abnormal gait, Decreased balance, Decreased endurance, Decreased mobility, Difficulty walking, Hypomobility, Increased muscle spasms, Impaired tone, Decreased range of motion, Decreased coordination, Decreased strength, Impaired UE functional use, Pain, Decreased activity  tolerance, Decreased safety awareness, Impaired perceived functional ability, Impaired sensation, Improper body mechanics, Postural dysfunction, Decreased knowledge of use of DME, Decreased knowledge of precautions, Impaired flexibility, Impaired vision/preception  Visit Diagnosis: Muscle weakness (generalized)  Unsteadiness on feet  Other abnormalities of gait and mobility  Hemiplegia and hemiparesis following cerebral infarction affecting left non-dominant side (HCC)  Other lack of coordination  Difficulty in walking, not elsewhere classified     Problem List Patient Active Problem List   Diagnosis Date Noted  . Chest pain of uncertain etiology  09/05/2019  . History of CVA (cerebrovascular accident) 09/05/2019  . Hyperglycemia 09/05/2019  . Anemia 09/05/2019  . AKI (acute kidney injury) ([place]) 09/05/2019  . Atypical pneumonia 09/05/2019  . Coagulation disorder ([place]) 06/12/2019  . Gastroesophageal reflux disease   . Hemiplegia and hemiparesis following cerebral infarction affecting left non-dominant side ([place]) 11/25/2018  . 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 RBBB 10/15/2018  . Sinus bradycardia 10/15/2018  . Dysphagia, post-stroke   . Neuropathic pain   . Lacunar infarct, acute ([place]) 09/06/2018  . TIA (transient ischemic attack) 09/01/2018  . Microalbuminuria 07/29/2018  . Left leg pain 01/13/2018  . Hyperlipidemia 02/11/2017  . Recurrent strokes ([place]) 11/10/2016  . Essential hypertension 11/10/2016  . Diabetes ([place]) 11/10/2016  . Tobacco abuse 08/28/2013    Lieutenant Diego PT, DPT 12:03 PM,10/25/19   [place] MAIN [facility] SERVICES [address] [place], [place], [address] Phone: [phone]   Fax:  [phone]  Name: DAREION KNEECE Sr. MRN: [id number] Date of Birth: 1955-08-24

## 2019-10-25 NOTE — Therapy (Signed)
[place] [facility] MAIN [facility] SERVICES [address] [place], [place], [address] Phone: [phone]   Fax:  [phone]  Occupational Therapy Treatment  Patient Details  Name: Evan SAMEK Sr. MRN: [id number] Date of Birth: 1955/11/24 Referring Provider (OT): Hope Pigeon   Encounter Date: 10/25/2019  OT End of Session - 10/25/19 1140    Visit Number  5    Number of Visits  24    Date for OT Re-Evaluation  12/20/19    Authorization Type  Medicaid    OT Start Time  1017    OT Stop Time  1100    OT Time Calculation (min)  43 min    Activity Tolerance  Patient tolerated treatment well    Behavior During Therapy  WFL for tasks assessed/performed       Past Medical History:  Diagnosis Date  . Carotid arterial disease (HCC)    a. 08/2018 Carotid U/S: min-mod RICA atherosclerosis w/o hemodynamically significant stenosis. Nl LICA.  . Diabetes 1.5, managed as type 2 (HCC)   . Diastolic dysfunction    a. 08/2018 Echo: EF 65%. No rwma. Gr1 DD. Mild MR.  Marland Kitchen Hypercholesterolemia   . Hypertension   . Left hemiparesis (HCC)    a. Ambulated w/ Cane. Limited use of LUE.  . Pain in both feet   . Poorly controlled diabetes mellitus (HCC)    a. 04/2019 A1c 13.8.  Marland Kitchen Recurrent strokes (HCC)    a. 10/2016 MRI/A: Acute 40mm R thalamic infarct, ? subacute infarct of R corona radiata; b. 08/2017 MRI/A: Acute 28mm lateral L thalamic infarct. Other more remote lacunar infarcts of thalami bilat. Small vessel dzs; c. 08/2018 MRI/A: Acute lacunar infarct of the post limb of R internal capsule. Underlying advanced chrnoic small vessel dzs w/o hemodynamically significant stenosis.  . Tobacco abuse     Past Surgical History:  Procedure Laterality Date  . ESOPHAGOGASTRODUODENOSCOPY (EGD) WITH PROPOFOL N/A 04/26/2019   Procedure: ESOPHAGOGASTRODUODENOSCOPY (EGD) WITH PROPOFOL;  Surgeon: Toney Reil, MD;  Location: [facility] ENDOSCOPY;  Service: Gastroenterology;  Laterality:  N/A;    There were no vitals filed for this visit.  Subjective Assessment - 10/25/19 1138    Subjective   Pt. reports the ROM feels good to his LUE    Patient is accompanied by:  Family member    Pertinent History  Pt. is a 64 y.o. male with a history of a CVA with left hemiplegia one year ago was admitted to [facility] on 09/04/2019 with Atypical Pneumonia, Diabetic Ketoacidosis without coma. Pt. was then admitted to [facility] received inpatient rehabilitation services. Pt. resides with his wife who assists with all ADLs, and IADLs. Pt. was previously indepedent with ADL tasks, worked in Museum/gallery curator, and enjoys fishing.    Limitations  LUE functioning, ADLs, and IADLs    Currently in Pain?  No/denies      OT TREATMENT    Neuro muscular re-education:  Pt. Tolerated weightbearing, and proprioceptive input through the LUE, and hand to inhibit tone, and prepare the UE for ROM, and facilitation of active movement. Pt. workedonweightbearing with a flat open hand with slow forward rocking, and preparing the hand for weightbearing position when seated. Pt. worked on alternating weightbearing with functional reaching. Pt. worked on reaching with the left hand for cones, and leaning forward with the trunk to place them onto a tabletop surface. Pt. worked on extending his digits to release the cones. Pt. presented with more active digit extension consistently  throughout the session today.  Therapeutic Exercise:  Pt. tolerated PROM/AAROM/AROM in all joint ranges of the LUE, and hand with slow gentle stretching is tone reducing patterns.   Manual Therapy:  Pt. Tolerated scapular mobilization in elevation, depression, and rotation while in sidelying to prepare the LUE for ROM. Pt. Tolerated soft tissue mobilizations of radius on ulna for supination secondary to tone, and tightness in pronators. Pt. Tolerated soft tissue mobilizations metacarpal spread stretches to the left hand prior to ROM. Manual  techniques were performed prior to, and independent of ROM/ther. Ex.  Pt. education was provided about a left resting hand splint. A request was submitted with the pt.to [facility] to help cover the cost of the splint. Pt. continues to present with flexor tone, tightness, and spasticity in his left UE, and hand. Pt. was able to initiate active digit extension more consistently with facilititation, and when releasing his hand off of objects. Pt. continues to work on improving, and normaling tone in the LUE, and facilitating increased active volitional movement.                      OT Education - 10/25/19 1140    Education Details  LUE ROM    Person(s) Educated  Patient    Methods  Explanation    Comprehension  Verbalized understanding;Returned demonstration          OT Long Term Goals - 09/27/19 1711      OT LONG TERM GOAL #1   Title  Pt. will be independent with donning his shirt using one armed dressing techniques    Baseline  Eval: Pt. requires assist form caregiver    Time  12    Period  Weeks    Status  New    Target Date  12/20/19      OT LONG TERM GOAL #2   Title  Pt. will donn LE dressing with modified independence    Baseline  Eval: MaxA    Time  12    Period  Weeks    Status  New    Target Date  12/20/19      OT LONG TERM GOAL #3   Title  Pt. will improve left digit flexion to be able to hold a toothbrush while applying toothpaste.    Baseline  Eval: Pt. is unable    Time  12    Period  Weeks    Status  New    Target Date  12/20/19      OT LONG TERM GOAL #4   Title  Pt. will improve left shoulder flexion,and abduction AROM by 20 degrees to be able to reach his head in preparation for brushing his hair.    Baseline  Eval: Pt. is unable    Time  12    Period  Weeks    Status  New    Target Date  12/20/19      OT LONG TERM GOAL #5   Title  Pt. will be independent with HEP for the LUE, and hand    Baseline  Eval: Pt. does not  have a HEP    Time  12    Period  Weeks    Status  New    Target Date  12/20/19            Plan - 10/25/19 1140    Clinical Impression Statement  Pt. education was provided about a left resting hand splint. A request  was submitted with the pt.to [facility] to help cover the cost of the splint. Pt. continues to present with flexor tone, tightness, and spasticity in his left UE, and hand. Pt. was able to initiate active digit extension more consistently with facilititation, and when releasing his hand off of objects. Pt. continues to work on improving, and normaling tone in the LUE, and facilitating increased active volitional movement.    OT Occupational Profile and History  Detailed Assessment- Review of Records and additional review of physical, cognitive, psychosocial history related to current functional performance    Occupational performance deficits (Please refer to evaluation for details):  ADL's;IADL's    Body Structure / Function / Physical Skills  ADL;GMC;Strength;IADL;FMC    Rehab Potential  Good    Clinical Decision Making  Multiple treatment options, significant modification of task necessary    Modification or Assistance to Complete Evaluation   Max significant modification of tasks or assist is necessary to complete    OT Frequency  2x / week    OT Duration  12 weeks    OT Treatment/Interventions  Self-care/ADL training;Therapeutic exercise;Patient/family education;DME and/or AE instruction;Neuromuscular education;Manual Therapy;Therapeutic activities;Splinting;Moist Heat;Electrical Stimulation    Consulted and Agree with Plan of Care  Patient;Family member/caregiver    Family Member Consulted  wife       Patient will benefit from skilled therapeutic intervention in order to improve the following deficits and impairments:   Body Structure / Function / Physical Skills: ADL, GMC, Strength, IADL, [facility]       Visit Diagnosis: Muscle weakness  (generalized)    Problem List Patient Active Problem List   Diagnosis Date Noted  . Chest pain of uncertain etiology 67/34/1937  . History of CVA (cerebrovascular accident) 09/05/2019  . Hyperglycemia 09/05/2019  . Anemia 09/05/2019  . AKI (acute kidney injury) ([place]) 09/05/2019  . Atypical pneumonia 09/05/2019  . Coagulation disorder ([place]) 06/12/2019  . Gastroesophageal reflux disease   . Hemiplegia and hemiparesis following cerebral infarction affecting left non-dominant side ([place]) 11/25/2018  . 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 RBBB 10/15/2018  . Sinus bradycardia 10/15/2018  . Dysphagia, post-stroke   . Neuropathic pain   . Lacunar infarct, acute ([place]) 09/06/2018  . TIA (transient ischemic attack) 09/01/2018  . Microalbuminuria 07/29/2018  . Left leg pain 01/13/2018  . Hyperlipidemia 02/11/2017  . Recurrent strokes ([place]) 11/10/2016  . Essential hypertension 11/10/2016  . Diabetes ([place]) 11/10/2016  . Tobacco abuse 08/28/2013    Harrel Carina, MS, OTR/L 10/25/2019, 2:20 PM  [place] MAIN [facility] SERVICES [address] [place], [place], [address] Phone: [phone]   Fax:  [phone]  Name: Evan GURRY Sr. MRN: [id number] Date of Birth: [birth year]

## 2019-10-26 ENCOUNTER — Ambulatory Visit (INDEPENDENT_AMBULATORY_CARE_PROVIDER_SITE_OTHER): Payer: Medicaid Other | Admitting: Internal Medicine

## 2019-10-26 ENCOUNTER — Other Ambulatory Visit: Payer: Self-pay

## 2019-10-26 ENCOUNTER — Encounter: Payer: Self-pay | Admitting: Internal Medicine

## 2019-10-26 VITALS — BP 120/74 | HR 52 | Ht 66.0 in | Wt 164.0 lb

## 2019-10-26 DIAGNOSIS — I639 Cerebral infarction, unspecified: Secondary | ICD-10-CM

## 2019-10-26 DIAGNOSIS — I480 Paroxysmal atrial fibrillation: Secondary | ICD-10-CM | POA: Diagnosis not present

## 2019-10-26 DIAGNOSIS — I10 Essential (primary) hypertension: Secondary | ICD-10-CM

## 2019-10-26 DIAGNOSIS — E1165 Type 2 diabetes mellitus with hyperglycemia: Secondary | ICD-10-CM | POA: Diagnosis not present

## 2019-10-26 DIAGNOSIS — I482 Chronic atrial fibrillation, unspecified: Secondary | ICD-10-CM | POA: Insufficient documentation

## 2019-10-26 DIAGNOSIS — E785 Hyperlipidemia, unspecified: Secondary | ICD-10-CM | POA: Diagnosis not present

## 2019-10-26 DIAGNOSIS — I4891 Unspecified atrial fibrillation: Secondary | ICD-10-CM | POA: Insufficient documentation

## 2019-10-26 MED ORDER — APIXABAN 5 MG PO TABS: 5.0000 mg | ORAL_TABLET | Freq: Two times a day (BID) | ORAL | 1 refills | Status: DC

## 2019-10-26 NOTE — Progress Notes (Signed)
Follow-up Outpatient Visit Date: 10/26/2019  Primary Care Provider: Hubbard Hartshorn, [address] [place] [address]  Chief Complaint: Follow-up event monitor results  HPI:  [name] is a 64 y.o. male with history of multiple strokes with left hemiparesis, hypertension, hyperlipidemia, uncontrolled diabetes mellitus, and tobacco use, who presents for follow-up of abnormal event monitor showing paroxysmal atrial fibrillation.  I met [name] a month ago after preceding hospitalization for chest pain in the setting of severe hyperglycemia.  Stress test was low risk without ischemia.  Echo showed preserved LVEF.  He presented to the [facility] emergency department the day after discharge with dysarthria, left facial droop, and worsening left-sided weakness.  Brain MRI showed evidence of acute stroke involving the left paramedian pons and the right cerebellar hemisphere.  He was improving from a functional capacity standpoint when I saw him last month.  He had never undergone TEE or ambulatory event monitoring for work-up of his recurrent strokes.  We agreed to place an event monitor, which showed paroxysmal atrial fibrillation with the longest episode lasting just over an hour (atrial fibrillation burden less than 1%).  Today, [name] reports that he has been feeling relatively well.  His left-sided weakness is back to baseline.  He has not developed any new focal neurologic deficits.  He continues to experience occasional palpitations without associated symptoms.  He denies further chest pain, shortness of breath, lightheadedness, and edema.  He has not had any falls or bleeding.  --------------------------------------------------------------------------------------------------  Past Medical History:  Diagnosis Date  . Carotid arterial disease ([place])    a. 08/2018 Carotid U/S: min-mod RICA atherosclerosis w/o hemodynamically significant stenosis. Nl LICA.  . Diabetes 1.5,  managed as type 2 ([place])   . Diastolic dysfunction    a. 08/2018 Echo: EF 65%. No rwma. Gr1 DD. Mild MR.  Marland Kitchen Hypercholesterolemia   . Hypertension   . Left hemiparesis ([place])    a. Ambulated w/ Cane. Limited use of LUE.  . Pain in both feet   . Poorly controlled diabetes mellitus ([place])    a. 04/2019 A1c 13.8.  Marland Kitchen Recurrent strokes ([place])    a. 10/2016 MRI/A: Acute 54m R thalamic infarct, ? subacute infarct of R corona radiata; b. 08/2017 MRI/A: Acute 870mlateral L thalamic infarct. Other more remote lacunar infarcts of thalami bilat. Small vessel dzs; c. 08/2018 MRI/A: Acute lacunar infarct of the post limb of R internal capsule. Underlying advanced chrnoic small vessel dzs w/o hemodynamically significant stenosis.  . Tobacco abuse    Past Surgical History:  Procedure Laterality Date  . ESOPHAGOGASTRODUODENOSCOPY (EGD) WITH PROPOFOL N/A 04/26/2019   Procedure: ESOPHAGOGASTRODUODENOSCOPY (EGD) WITH PROPOFOL;  Surgeon: VaLin LandsmanMD;  [facility] Service: Gastroenterology;  Laterality: N/A;    Current Meds  Medication Sig  . amLODipine (NORVASC) 10 MG tablet Take 1 tablet (10 mg total) by mouth daily.  . Marland Kitchenspirin EC 81 MG tablet Take 81 mg by mouth daily.  . Marland Kitchentorvastatin (LIPITOR) 80 MG tablet Take 1 tablet (80 mg total) by mouth every evening.  . baclofen (LIORESAL) 10 MG tablet TAKE 1 TABLET BY MOUTH 3 TIMES DAILY  . Blood Glucose Monitoring Suppl (FIFTY50 GLUCOSE METER 2.0) w/Device KIT Use as instructed  . clopidogrel (PLAVIX) 75 MG tablet Take 1 tablet (75 mg total) by mouth daily.  . Marland Kitchenabapentin (NEURONTIN) 300 MG capsule Take 2 capsules (600 mg total) by mouth 2 (two) times daily.  . Marland KitchenlipiZIDE (GLUCOTROL) 5  MG tablet TAKE 1 TABLET BY MOUTH DAILY BEFORE BREAKFAST  . hydrochlorothiazide (HYDRODIURIL) 25 MG tablet Take 1 tablet (25 mg total) by mouth every morning.  . insulin detemir (LEVEMIR) 100 UNIT/ML injection Inject 10 Units into the skin daily.  . Insulin Pen  Needle (NOVOFINE) 32G X 6 MM MISC 1 each by Does not apply route once a week.  Marland Kitchen lisinopril (ZESTRIL) 40 MG tablet TAKE 1 TABLET BY MOUTH DAILY  . pantoprazole (PROTONIX) 40 MG tablet TAKE 1 TABLET BY MOUTH DAILY BEFORE BREAKFAST    Allergies: Patient has no known allergies.  Social History   Tobacco Use  . Smoking status: Former Smoker    Packs/day: 1.00    Years: 38.00    Pack years: 38.00    Types: Cigarettes    Start date: 1982  . Smokeless tobacco: Never Used  Substance Use Topics  . Alcohol use: Not Currently    Alcohol/week: 1.0 standard drinks    Types: 1 Cans of beer per week    Comment: previously drank but nothing in 1-2 yrs (08/2019).  . Drug use: Not Currently    Types: Cocaine, Marijuana    Comment: prev used cocaine/marijuana but none x 1-2 yrs (08/2019).    Family History  Problem Relation Age of Onset  . Hypertension Mother   . Stroke Mother        died @ age 4  . Hypertension Father   . Diabetes Father   . Heart attack Father        died @ 2    Review of Systems: A 12-system review of systems was performed and was negative except as noted in the HPI.  --------------------------------------------------------------------------------------------------  Physical Exam: BP 120/74 (BP Location: Left Arm, Patient Position: Sitting, Cuff Size: Normal)   Pulse (!) 52   Ht 5' 6"  (1.676 m)   Wt 164 lb (74.4 kg)   SpO2 97%   BMI 26.47 kg/m   General: NAD.  Accompanied by his wife. Neck: No JVD. Lungs: Clear to auscultation without wheezes or crackles. Heart: Regular rate and rhythm without murmurs. Abdomen: Soft, nontender, nondistended. Extremities: No lower extremity edema. Neuro: Left-sided weakness and slight dysarthria noted.  EKG: Sinus bradycardia with left axis deviation, right bundle branch block, poor R wave progression, and possible inferior Q waves.  No significant change from prior tracing on 09/29/2019.  Lab Results  Component Value  Date   WBC 5.1 10/02/2019   HGB 12.1 (L) 10/02/2019   HCT 35.7 (L) 10/02/2019   MCV 84.6 10/02/2019   PLT 294 10/02/2019    Lab Results  Component Value Date   NA 140 10/02/2019   K 3.7 10/02/2019   CL 103 10/02/2019   CO2 23 10/02/2019   BUN 23 10/02/2019   CREATININE 1.60 (H) 10/02/2019   GLUCOSE 256 (H) 10/02/2019   ALT 46 (H) 02/15/2019    Lab Results  Component Value Date   CHOL 131 01/31/2019   HDL 47 01/31/2019   LDLCALC 54 01/31/2019   TRIG 239 (H) 01/31/2019   CHOLHDL 2.8 01/31/2019    --------------------------------------------------------------------------------------------------  ASSESSMENT AND PLAN: Recurrent strokes and paroxysmal atrial fibrillation: [name] denies new focal neurologic deficits and seems to be back to his baseline following most recent stroke earlier this year.  Event monitor showed paroxysmal atrial fibrillation lasting up to just over an hour at a time with overall relatively low burden.  However, given history of recurrent strokes in multiple vascular distributions, I remain  concerned about cardioembolic source.  I have recommended discontinuation of aspirin and clopidogrel and initiation of therapeutic anticoagulation in the setting of a CHA2DS2-VASc score of at least 4 (hypertension, diabetes, and stroke x2).  We have discussed treatment options and have agreed to initiate apixaban 5 mg twice daily.  Recent CBC was normal.  However mild renal insufficiency was noted on most recent labs.  We will therefore repeat a basic metabolic panel today to ensure his renal function has not worsened.  Continue statin therapy for secondary prevention.  Chest pain: No recurrence since our last visit with recent inpatient echo and myocardial perfusion stress test being unrevealing.  No further work-up at this time.  Hypertension: Blood pressure well controlled today.  No medication changes recommended at this time.  Hyperlipidemia: Continue  atorvastatin 80 mg daily.  Follow-up: Return to clinic in 3 months.  Nelva Bush, MD 10/26/2019 9:54 AM

## 2019-10-26 NOTE — Patient Instructions (Addendum)
Medication Instructions:  Your physician has recommended you make the following change in your medication:  1. START Eliquis 5 mg take one tablet by mouth twice daily. 2. STOP Aspirin. Stop taking Aspirin as soon as you start your Eliquis. 3. STOP Plavix. Stop taking Plavix as soon as you start your Eliquis. *If you need a refill on your cardiac medications before your next appointment, please call your pharmacy*   Lab Work: Your physician recommends that you return for lab work in: TODAY BMP  If you have labs (blood work) drawn today and your tests are completely normal, you will receive your results only by: Marland Kitchen MyChart Message (if you have MyChart) OR . A paper copy in the mail If you have any lab test that is abnormal or we need to change your treatment, we will call you to review the results.   Testing/Procedures: None   Follow-Up: At [facility], you and your health needs are our priority.  As part of our continuing mission to provide you with exceptional heart care, we have created designated Provider Care Teams.  These Care Teams include your primary Cardiologist (physician) and Advanced Practice Providers (APPs -  Physician Assistants and Nurse Practitioners) who all work together to provide you with the care you need, when you need it.  We recommend signing up for the patient portal called "MyChart".  Sign up information is provided on this After Visit Summary.  MyChart is used to connect with patients for Virtual Visits (Telemedicine).  Patients are able to view lab/test results, encounter notes, upcoming appointments, etc.  Non-urgent messages can be sent to your provider as well.   To learn more about what you can do with MyChart, go to [email or web address].    Your next appointment:   3 month(s)  The format for your next appointment:   In Person  Provider:    You may see [name], MD or one of the following Advanced Practice Providers on your designated  Care Team:    Nicolasa Ducking, NP  Eula Listen, PA-C  Marisue Ivan, PA-C   Drug name: Eliquis       Strength: 5mg         Qty: 1 Box  LOT:  Exp.Date: 1/23  The patient has been instructed regarding the correct time, dose, and frequency of taking this medication, including desired effects and most common side effects.   2/23 10:27 AM 10/26/2019

## 2019-10-27 ENCOUNTER — Ambulatory Visit: Payer: Medicaid Other

## 2019-10-27 ENCOUNTER — Encounter: Payer: Medicaid Other | Admitting: Occupational Therapy

## 2019-10-27 ENCOUNTER — Ambulatory Visit: Payer: Medicaid Other | Admitting: Speech Pathology

## 2019-10-27 ENCOUNTER — Telehealth: Payer: Self-pay | Admitting: *Deleted

## 2019-10-27 DIAGNOSIS — E1165 Type 2 diabetes mellitus with hyperglycemia: Secondary | ICD-10-CM | POA: Diagnosis not present

## 2019-10-27 DIAGNOSIS — I639 Cerebral infarction, unspecified: Secondary | ICD-10-CM

## 2019-10-27 DIAGNOSIS — I10 Essential (primary) hypertension: Secondary | ICD-10-CM

## 2019-10-27 DIAGNOSIS — Z79899 Other long term (current) drug therapy: Secondary | ICD-10-CM

## 2019-10-27 LAB — BASIC METABOLIC PANEL
BUN/Creatinine Ratio: 14 (ref 10–24)
BUN: 26 mg/dL (ref 8–27)
CO2: 22 mmol/L (ref 20–29)
Calcium: 9.6 mg/dL (ref 8.6–10.2)
Chloride: 101 mmol/L (ref 96–106)
Creatinine, Ser: 1.82 mg/dL — ABNORMAL HIGH (ref 0.76–1.27)
GFR calc Af Amer: 45 mL/min/{1.73_m2} — ABNORMAL LOW (ref >59)
GFR calc non Af Amer: 39 mL/min/{1.73_m2} — ABNORMAL LOW (ref >59)
Glucose: 242 mg/dL — ABNORMAL HIGH (ref 65–99)
Potassium: 4.2 mmol/L (ref 3.5–5.2)
Sodium: 141 mmol/L (ref 134–144)

## 2019-10-27 NOTE — Telephone Encounter (Signed)
-----   Message from [name], MD sent at 10/27/2019  7:01 AM EST ----- Please let Mr. Encina know that his kidney function has worsened and is now moderately impaired.  I recommend that he stop taking HCTZ and increase his water intake.  He should also avoid all NSAID's like ibuprofen naproxen.  It is okay to proceed with starting apixaban.  He will need a repeat BMP early next week.  I will forward these labs to his PCP as well for her review.

## 2019-10-27 NOTE — Telephone Encounter (Signed)
Patient's wife calling back, ok per DPR. She verbalized understanding of the results, to stop hydrochlorothiazide, continue Eliquis and increase water intake. She will bring him to the [facility] on Monday or Tuesday for the lab work.  Med list updated.

## 2019-10-27 NOTE — Telephone Encounter (Signed)
No answer. Left message to call back.   

## 2019-10-28 DIAGNOSIS — E1165 Type 2 diabetes mellitus with hyperglycemia: Secondary | ICD-10-CM | POA: Diagnosis not present

## 2019-10-29 DIAGNOSIS — E1165 Type 2 diabetes mellitus with hyperglycemia: Secondary | ICD-10-CM | POA: Diagnosis not present

## 2019-10-30 DIAGNOSIS — E1165 Type 2 diabetes mellitus with hyperglycemia: Secondary | ICD-10-CM | POA: Diagnosis not present

## 2019-10-31 ENCOUNTER — Encounter: Payer: Self-pay | Admitting: Occupational Therapy

## 2019-10-31 ENCOUNTER — Encounter: Payer: Self-pay | Admitting: Speech Pathology

## 2019-10-31 ENCOUNTER — Other Ambulatory Visit: Payer: Self-pay

## 2019-10-31 ENCOUNTER — Ambulatory Visit: Payer: Medicaid Other | Admitting: Physical Therapy

## 2019-10-31 ENCOUNTER — Encounter: Payer: Self-pay | Admitting: Physical Therapy

## 2019-10-31 ENCOUNTER — Ambulatory Visit: Payer: Medicaid Other | Admitting: Speech Pathology

## 2019-10-31 ENCOUNTER — Ambulatory Visit: Payer: Medicaid Other | Admitting: Occupational Therapy

## 2019-10-31 DIAGNOSIS — I6381 Other cerebral infarction due to occlusion or stenosis of small artery: Secondary | ICD-10-CM | POA: Diagnosis not present

## 2019-10-31 DIAGNOSIS — I69354 Hemiplegia and hemiparesis following cerebral infarction affecting left non-dominant side: Secondary | ICD-10-CM

## 2019-10-31 DIAGNOSIS — R262 Difficulty in walking, not elsewhere classified: Secondary | ICD-10-CM | POA: Diagnosis not present

## 2019-10-31 DIAGNOSIS — R490 Dysphonia: Secondary | ICD-10-CM

## 2019-10-31 DIAGNOSIS — R278 Other lack of coordination: Secondary | ICD-10-CM

## 2019-10-31 DIAGNOSIS — R2689 Other abnormalities of gait and mobility: Secondary | ICD-10-CM

## 2019-10-31 DIAGNOSIS — M6281 Muscle weakness (generalized): Secondary | ICD-10-CM

## 2019-10-31 DIAGNOSIS — R471 Dysarthria and anarthria: Secondary | ICD-10-CM

## 2019-10-31 DIAGNOSIS — M79602 Pain in left arm: Secondary | ICD-10-CM | POA: Diagnosis not present

## 2019-10-31 DIAGNOSIS — E1165 Type 2 diabetes mellitus with hyperglycemia: Secondary | ICD-10-CM | POA: Diagnosis not present

## 2019-10-31 DIAGNOSIS — R2681 Unsteadiness on feet: Secondary | ICD-10-CM | POA: Diagnosis not present

## 2019-10-31 NOTE — Therapy (Signed)
[place] MAIN [facility] SERVICES [address] [place], [place], [address] Phone: [phone]   Fax:  [phone]  Speech Language Pathology Treatment  Patient Details  Name: Evan BURCIAGA Sr. MRN: [id number] Date of Birth: 04-04-1956 Referring Provider (SLP): Vida Rigger   Encounter Date: 10/31/2019  End of Session - 10/31/19 1706    Visit Number  2    Number of Visits  8    Date for SLP Re-Evaluation  12/15/19    Authorization Type  Medicaid    Authorization - Visit Number  2    Authorization - Number of Visits  10    SLP Start Time  0900    SLP Stop Time   0950    SLP Time Calculation (min)  50 min    Activity Tolerance  Patient tolerated treatment well       Past Medical History:  Diagnosis Date  . Carotid arterial disease ([place])    a. 08/2018 Carotid U/S: min-mod RICA atherosclerosis w/o hemodynamically significant stenosis. Nl LICA.  . Diabetes 1.5, managed as type 2 ([place])   . Diastolic dysfunction    a. 08/2018 Echo: EF 65%. No rwma. Gr1 DD. Mild MR.  Marland Kitchen Hypercholesterolemia   . Hypertension   . Left hemiparesis ([place])    a. Ambulated w/ Cane. Limited use of LUE.  . Pain in both feet   . Poorly controlled diabetes mellitus ([place])    a. 04/2019 A1c 13.8.  Marland Kitchen Recurrent strokes ([place])    a. 10/2016 MRI/A: Acute 63mm R thalamic infarct, ? subacute infarct of R corona radiata; b. 08/2017 MRI/A: Acute 69mm lateral L thalamic infarct. Other more remote lacunar infarcts of thalami bilat. Small vessel dzs; c. 08/2018 MRI/A: Acute lacunar infarct of the post limb of R internal capsule. Underlying advanced chrnoic small vessel dzs w/o hemodynamically significant stenosis.  . Tobacco abuse     Past Surgical History:  Procedure Laterality Date  . ESOPHAGOGASTRODUODENOSCOPY (EGD) WITH PROPOFOL N/A 04/26/2019   Procedure: ESOPHAGOGASTRODUODENOSCOPY (EGD) WITH PROPOFOL;  Surgeon: Lin Landsman, MD;  Location: [place];  Service:  Gastroenterology;  Laterality: N/A;    There were no vitals filed for this visit.  Subjective Assessment - 10/31/19 1705    Subjective  Patient was jovial and in good spirits.    Currently in Pain?  No/denies            ADULT SLP TREATMENT - 10/31/19 0001      General Information   Behavior/Cognition  Alert;Cooperative;Pleasant mood      Treatment Provided   Treatment provided  Cognitive-Linquistic      Pain Assessment   Pain Assessment  No/denies pain      Cognitive-Linquistic Treatment   Treatment focused on  Voice    Skilled Treatment  Establish unvoiced flowphonation. Patient was able to generate and maintain a voiceless airflow for repeitions of simple phrases and sentences of increasing length. Patient generated 60% correctly. He needed prompts on longer sentences to not use his voice. Patient was educated on utilizing breath to carry speech.      Progression Toward Goals   Progression toward goals  Progressing toward goals       SLP Education - 10/31/19 1705    Education Details  flow phonation    Person(s) Educated  Patient    Methods  Explanation    Comprehension  Verbalized understanding         SLP Long Term Goals - 10/20/19  1055      SLP LONG TERM GOAL #1   Title  The patient will demonstrate independent completion of extrinsic laryngeal muscle stretches.    Baseline  Patient very tight, requires supervision for accurate performance    Time  8    Period  Weeks    Status  New    Target Date  12/15/19      SLP LONG TERM GOAL #2   Title  The patient will be independent for abdominal breathing and breath support exercises.    Baseline  Patient requires max cues to execute accurately    Time  8    Period  Weeks    Status  New    Target Date  12/15/19      SLP LONG TERM GOAL #3   Title  The patient will minimize vocal tension via flow phonation and/or resonant voice therapy (or comparable techniques) with min SLP cues with 80% accuracy.     Baseline  Tight/strained vocal quality    Time  8    Period  Weeks    Status  New    Target Date  12/15/19      SLP LONG TERM GOAL #4   Title  Pt will improve speech intelligibility for sentences by controlling rate of speech, over-articulation, and increased loudness to achieve 80% intelligibility.    Baseline  Patient cannot increase volume without vocal strain    Time  8    Period  Weeks    Status  New    Target Date  12/15/19       Plan - 10/31/19 1707    Clinical Impression Statement  At one month post onset of an acute cerebellar infarct, the patient presents with mild-moderate dysarthria characterized by imprecise articulation and strained vocal quality with limited pitch and volume control. His poor motor control and recent onset of drooling is suspected to be due to mild apraxia. He will benefit from skilled speech therapy for education, to improve speech intelligibility, improve breath support, promote easy flow phonation, and learn techniques to increase loudness and pitch range without strain.    Speech Therapy Frequency  1x /week    Duration  Other (comment)   6 weeks   Potential to Achieve Goals  Good    Potential Considerations  Ability to learn/carryover information;Previous level of function;Severity of impairments;Cooperation/participation level;Family/community support    SLP Home Exercise Plan  Provided    Consulted and Agree with Plan of Care  Patient;Family member/caregiver    Family Member Consulted  Spouse       Patient will benefit from skilled therapeutic intervention in order to improve the following deficits and impairments:   Dysarthria and anarthria    Problem List Patient Active Problem List   Diagnosis Date Noted  . Paroxysmal atrial fibrillation (HCC) 10/26/2019  . Chest pain of uncertain etiology 09/05/2019  . History of CVA (cerebrovascular accident) 09/05/2019  . Hyperglycemia 09/05/2019  . Anemia 09/05/2019  . AKI (acute kidney injury)  (HCC) 09/05/2019  . Atypical pneumonia 09/05/2019  . Coagulation disorder (HCC) 06/12/2019  . Gastroesophageal reflux disease   . Hemiplegia and hemiparesis following cerebral infarction affecting left non-dominant side (HCC) 11/25/2018  . 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 RBBB 10/15/2018  . Sinus bradycardia 10/15/2018  . Dysphagia, post-stroke   . Neuropathic pain   . Lacunar infarct, acute (HCC) 09/06/2018  . TIA (transient ischemic attack) 09/01/2018  . Microalbuminuria 07/29/2018  . Left leg pain 01/13/2018  .  Hyperlipidemia LDL goal <70 02/11/2017  . Recurrent strokes (HCC) 11/10/2016  . Essential hypertension 11/10/2016  . Diabetes (HCC) 11/10/2016  . Tobacco abuse 08/28/2013    Almarosa Bohac 10/31/2019, 5:07 PM  [place] [facility] MAIN [facility] SERVICES [address] [place], [place], [address] Phone: [phone]   Fax:  [phone]   Name: Evan HIDROGO Sr. MRN: [id number] Date of Birth: 29-Apr-1956

## 2019-10-31 NOTE — Therapy (Signed)
[place] MAIN [facility] SERVICES [address] [place], [place], [address] Phone: [phone]   Fax:  [phone]  Occupational Therapy Treatment  Patient Details  Name: Evan SCHAR Sr. MRN: [id number] Date of Birth: 10-May-1956 Referring Provider (OT): Sharlene Dory   Encounter Date: 10/31/2019  OT End of Session - 10/31/19 1625    Visit Number  6    Number of Visits  24    Date for OT Re-Evaluation  12/20/19    Authorization Type  Medicaid    OT Start Time  1615    OT Stop Time  1700    OT Time Calculation (min)  45 min    Activity Tolerance  Patient tolerated treatment well    Behavior During Therapy  [facility] for tasks assessed/performed       Past Medical History:  Diagnosis Date  . Carotid arterial disease ([place])    a. 08/2018 Carotid U/S: min-mod RICA atherosclerosis w/o hemodynamically significant stenosis. Nl LICA.  . Diabetes 1.5, managed as type 2 ([place])   . Diastolic dysfunction    a. 08/2018 Echo: EF 65%. No rwma. Gr1 DD. Mild MR.  Marland Kitchen Hypercholesterolemia   . Hypertension   . Left hemiparesis ([place])    a. Ambulated w/ Cane. Limited use of LUE.  . Pain in both feet   . Poorly controlled diabetes mellitus ([place])    a. 04/2019 A1c 13.8.  Marland Kitchen Recurrent strokes ([place])    a. 10/2016 MRI/A: Acute 17mm R thalamic infarct, ? subacute infarct of R corona radiata; b. 08/2017 MRI/A: Acute 18mm lateral L thalamic infarct. Other more remote lacunar infarcts of thalami bilat. Small vessel dzs; c. 08/2018 MRI/A: Acute lacunar infarct of the post limb of R internal capsule. Underlying advanced chrnoic small vessel dzs w/o hemodynamically significant stenosis.  . Tobacco abuse     Past Surgical History:  Procedure Laterality Date  . ESOPHAGOGASTRODUODENOSCOPY (EGD) WITH PROPOFOL N/A 04/26/2019   Procedure: ESOPHAGOGASTRODUODENOSCOPY (EGD) WITH PROPOFOL;  Surgeon: Lin Landsman, MD;  Location: [place];  Service: Gastroenterology;  Laterality:  N/A;    There were no vitals filed for this visit.  Subjective Assessment - 10/31/19 1622    Subjective   Pt reports he is doing well today.    Patient is accompanied by:  Family member    Pertinent History  Pt. is a 64 y.o. male with a history of a CVA with left hemiplegia one year ago was admitted to [facility] on 09/04/2019 with Atypical Pneumonia, Diabetic Ketoacidosis without coma. Pt. was then admitted to [facility] received inpatient rehabilitation services. Pt. resides with his wife who assists with all ADLs, and IADLs. Pt. was previously indepedent with ADL tasks, worked in Nurse, learning disability, and enjoys fishing.    Limitations  LUE functioning, ADLs, and IADLs    Patient Stated Goals  To be able to put his shirt on by himself    Currently in Pain?  No/denies         OT TREATMENT    Neuro muscular re-education:  Pt. Tolerated weightbearing, and proprioceptive input through the LUE, and hand to inhibit tone, and prepare the UE for ROM, and facilitation of active movement. Pt. workedonweightbearing with a flat open hand with slow forward rocking, and preparing the hand for weightbearing position when seated. Pt. worked on alternating weightbearing with functional reaching.Pt. worked on stretching LUE with RUE assist tabletop with increased stretch to LUe shoulder flexors.  Pt practiced control of ball on tabletop  and to use L hand with R to catch the ball when it bounced with mo verbal cues.  Pt. presented with more active digit extension consistently throughout the session. Pt. tolerated PROM/AAROM/AROM in all joint ranges of the LUE, and hand with slow gentle stretching in tone reducing patterns.   Manual Therapy:  Pt. tolerated heat for 10 minutes to L elbow during scapular mobilization in elevation, depression, and rotation while in sitting to prepare the LUE for ROM. Pt. Tolerated soft tissue mobilizations metacarpal spread stretches to the left hand prior to ROM. Manual techniques were  performed prior to, and independent of ROM/ther. Ex. Pt could benefit from use of a medium size compression gloved for edema control.  Pt. Updated that his resting hand splint was authorized by the [facility] and in the process of being ordered. Pt. continues to present with flexor tone, tightness, and spasticity in his left UE, and hand. Pt. was able to initiate active digit extension more consistently with facilititation, and when releasing his hand off of objects. Pt. continues to work on improving, and normaling tone in the LUE, and facilitating increased active volitional movement.                    OT Education - 10/31/19 1625    Education Details  LUE ROM    Person(s) Educated  Patient    Methods  Explanation    Comprehension  Verbalized understanding;Returned demonstration          OT Long Term Goals - 09/27/19 1711      OT LONG TERM GOAL #1   Title  Pt. will be independent with donning his shirt using one armed dressing techniques    Baseline  Eval: Pt. requires assist form caregiver    Time  12    Period  Weeks    Status  New    Target Date  12/20/19      OT LONG TERM GOAL #2   Title  Pt. will donn LE dressing with modified independence    Baseline  Eval: MaxA    Time  12    Period  Weeks    Status  New    Target Date  12/20/19      OT LONG TERM GOAL #3   Title  Pt. will improve left digit flexion to be able to hold a toothbrush while applying toothpaste.    Baseline  Eval: Pt. is unable    Time  12    Period  Weeks    Status  New    Target Date  12/20/19      OT LONG TERM GOAL #4   Title  Pt. will improve left shoulder flexion,and abduction AROM by 20 degrees to be able to reach his head in preparation for brushing his hair.    Baseline  Eval: Pt. is unable    Time  12    Period  Weeks    Status  New    Target Date  12/20/19      OT LONG TERM GOAL #5   Title  Pt. will be independent with HEP for the LUE, and hand    Baseline   Eval: Pt. does not have a HEP    Time  12    Period  Weeks    Status  New    Target Date  12/20/19            Plan - 10/31/19 1626  Clinical Impression Statement  Pt was updated about resting hand spoint being approved by the [facility] which he was excited about.  He tolerated moist heat to L elbow well for 10 minutes and instructed on how to provide moist heat at home.  He continues to have increased flexor tone and tightness in LUe and hand with beginning of extension in fingers.  He could benefit from a new medium size compression glove since edema in L hand is slightly worse today. He continues to be motivated to regain use of LUe and hand.    OT Occupational Profile and History  Detailed Assessment- Review of Records and additional review of physical, cognitive, psychosocial history related to current functional performance    Occupational performance deficits (Please refer to evaluation for details):  ADL's;IADL's    Body Structure / Function / Physical Skills  ADL;GMC;Strength;IADL;FMC    Rehab Potential  Good    Clinical Decision Making  Multiple treatment options, significant modification of task necessary    Modification or Assistance to Complete Evaluation   Max significant modification of tasks or assist is necessary to complete    OT Frequency  2x / week    OT Duration  12 weeks    OT Treatment/Interventions  Self-care/ADL training;Therapeutic exercise;Patient/family education;DME and/or AE instruction;Neuromuscular education;Manual Therapy;Therapeutic activities;Splinting;Moist Heat;Electrical Stimulation    OT Home Exercise Plan  More stretching exercises on tabletop and moist heat at home    Consulted and Agree with Plan of Care  Patient;Family member/caregiver    Family Member Consulted  wife       Patient will benefit from skilled therapeutic intervention in order to improve the following deficits and impairments:   Body Structure / Function / Physical  Skills: ADL, GMC, Strength, IADL, FMC       Visit Diagnosis: Muscle weakness (generalized)  Hemiplegia and hemiparesis following cerebral infarction affecting left non-dominant side (HCC)  Other lack of coordination    Problem List Patient Active Problem List   Diagnosis Date Noted  . Paroxysmal atrial fibrillation (HCC) 10/26/2019  . Chest pain of uncertain etiology 09/05/2019  . History of CVA (cerebrovascular accident) 09/05/2019  . Hyperglycemia 09/05/2019  . Anemia 09/05/2019  . AKI (acute kidney injury) (HCC) 09/05/2019  . Atypical pneumonia 09/05/2019  . Coagulation disorder (HCC) 06/12/2019  . Gastroesophageal reflux disease   . Hemiplegia and hemiparesis following cerebral infarction affecting left non-dominant side (HCC) 11/25/2018  . 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 RBBB 10/15/2018  . Sinus bradycardia 10/15/2018  . Dysphagia, post-stroke   . Neuropathic pain   . Lacunar infarct, acute (HCC) 09/06/2018  . TIA (transient ischemic attack) 09/01/2018  . Microalbuminuria 07/29/2018  . Left leg pain 01/13/2018  . Hyperlipidemia LDL goal <70 02/11/2017  . Recurrent strokes (HCC) 11/10/2016  . Essential hypertension 11/10/2016  . Diabetes (HCC) 11/10/2016  . Tobacco abuse 08/28/2013    Susanne Borders, OTR/L, [facility] ascom [phone] 10/31/19, 5:08 PM  [place] [facility] MAIN [facility] SERVICES [address] [place], [place], [address] Phone: [phone]   Fax:  [phone]  Name: Evan HEMPHILL Sr. MRN: [id number] Date of Birth: 1956-06-11

## 2019-10-31 NOTE — Therapy (Signed)
[place] [facility] MAIN [facility] SERVICES [address] [place], [place], [address] Phone: [phone]   Fax:  [phone]  Physical Therapy Treatment  Patient Details  Name: Evan COLOCHO Sr. MRN: [id number] Date of Birth: 1956-02-06 Referring Provider (PT): Hope Pigeon   Encounter Date: 10/31/2019  PT End of Session - 10/31/19 1525    Visit Number  5    Number of Visits  8    Date for PT Re-Evaluation  11/22/19    Authorization Type  2/10 eval 2/10; 1/3 tx session on 3/3 do goals for CAID    Authorization Time Period  authorized 9 visits 10/04/19- 12/05/19    Authorization - Visit Number  2    Authorization - Number of Visits  9    PT Start Time  1520    PT Stop Time  1600    PT Time Calculation (min)  40 min    Equipment Utilized During Treatment  Gait belt;Other (comment)   L swedish knee brace, AFO   Activity Tolerance  Patient tolerated treatment well    Behavior During Therapy  [facility] for tasks assessed/performed       Past Medical History:  Diagnosis Date  . Carotid arterial disease (HCC)    a. 08/2018 Carotid U/S: min-mod RICA atherosclerosis w/o hemodynamically significant stenosis. Nl LICA.  . Diabetes 1.5, managed as type 2 (HCC)   . Diastolic dysfunction    a. 08/2018 Echo: EF 65%. No rwma. Gr1 DD. Mild MR.  Marland Kitchen Hypercholesterolemia   . Hypertension   . Left hemiparesis (HCC)    a. Ambulated w/ Cane. Limited use of LUE.  . Pain in both feet   . Poorly controlled diabetes mellitus (HCC)    a. 04/2019 A1c 13.8.  Marland Kitchen Recurrent strokes (HCC)    a. 10/2016 MRI/A: Acute 64mm R thalamic infarct, ? subacute infarct of R corona radiata; b. 08/2017 MRI/A: Acute 67mm lateral L thalamic infarct. Other more remote lacunar infarcts of thalami bilat. Small vessel dzs; c. 08/2018 MRI/A: Acute lacunar infarct of the post limb of R internal capsule. Underlying advanced chrnoic small vessel dzs w/o hemodynamically significant stenosis.  . Tobacco abuse      Past Surgical History:  Procedure Laterality Date  . ESOPHAGOGASTRODUODENOSCOPY (EGD) WITH PROPOFOL N/A 04/26/2019   Procedure: ESOPHAGOGASTRODUODENOSCOPY (EGD) WITH PROPOFOL;  Surgeon: Toney Reil, MD;  Location: [facility] ENDOSCOPY;  Service: Gastroenterology;  Laterality: N/A;    There were no vitals filed for this visit.  Subjective Assessment - 10/31/19 1524    Subjective  Patient state he is doing well; he is not having any pain today.    Pertinent History  Patient is a pleasant 64 year old male returning to PT (evaluation on 09/04/19) with PMH of HTN, HLD, Type II DM, right basal ganglia stroke (08/2018), acute CVA, sinus bradycardia, RBBB, chronic knee pain, and peripheral neuropathy. Since patient has been seen last he has been admitted to the hospital three times and went to [facility] acute inpatient rehab for left paramedian pontine and right cerebellar ischemic stroke (09/07/19) discharged 09/23/19.  Per CIR documentation patient utilizes AFO and Swedish knee cage on LLE to prevent genu recurvatum. Patient was initially evaluated prior to recent hospitalization for L hemiplegia, went to inpatient rehab in 2020 for first CVA. Has an aide at home    Limitations  Lifting;Standing;Walking;House hold activities    How long can you sit comfortably?  n/a    How long can you stand  comfortably?  needs to hold onto objects    How long can you walk comfortably?  needs AD and CGA    Patient Stated Goals  to get back to fishing, walking better, balance.    Currently in Pain?  No/denies    Pain Score  0-No pain       Treatment: Sit to stand with loft strand crutch with VC for sequencing and SBA  Gait training with hurricane x 100 feet , SBA Gait training with loft strand  x 500 feet with reaching for cones above shoulder height , picking up cones off the floor, tapping the cone with RLE, pulling RUE matrix 22.5 lbs  Ascending and descending steps with loftstrand and no railing x 4 steps x 2  sets Sent a prescription for loftstrand crutches to the MD  Cues for weight shift and to avoid falling to LLE for better foot clearance; Patient able to exhibit better weight shift with increased repetition with less heavy step with LLE during cone tapping                         PT Education - 10/31/19 1525    Education Details  HEP    Person(s) Educated  Patient    Methods  Explanation;Demonstration    Comprehension  Verbalized understanding;Returned demonstration;Need further instruction       PT Short Term Goals - 09/27/19 1653      PT SHORT TERM GOAL #1   Title  Patient will be independent with HEP for improved therapeutic gains and self-management.    Baseline  2/10: give next session    Time  4    Period  Weeks    Status  New    Target Date  10/25/19        PT Long Term Goals - 09/27/19 1653      PT LONG TERM GOAL #1   Title  Patient will increase Berg Balance score by > 6 points (30/56) to demonstrate decreased fall risk during functional activities.    Baseline  2/10: 24/56    Time  8    Period  Weeks    Status  New    Target Date  11/22/19      PT LONG TERM GOAL #2   Title  Patient will increase 10 meter walk test to >1.85m/s as to improve gait speed for better community ambulation and to reduce fall risk.    Baseline  2/10: 0.31 m/s with hurrycane and w/c follow    Time  8    Period  Weeks    Status  New    Target Date  11/22/19      PT LONG TERM GOAL #3   Title  Patient (> 20 years old) will complete five times sit to stand test in < 15 seconds with no UE support indicating an increased LE strength and improved balance.    Baseline  2/10: 17 seconds heavy RUE support and use only of RLE    Time  8    Period  Weeks    Status  New    Target Date  11/22/19      PT LONG TERM GOAL #4   Title  Patient will increase BLE gross strength to 4+/5 as to improve functional strength for independent gait, increased standing tolerance and increased  ADL ability.    Baseline  2/10: see evaluation note    Time  8  Period  Weeks    Status  New    Target Date  11/22/19            Plan - 10/31/19 1526    Clinical Impression Statement  Patient was instructed in gait training with hurricane cane and loftstrand crutch with dynamic and static standing balance. Instructed in gait training on steps without railing and cues for sequencing. Patient will continue to benefit from skilled PT to improve mobility and safety.    Personal Factors and Comorbidities  Age;Education;Sex;Social Background;Behavior Pattern;Finances;Past/Current Experience;Time since onset of injury/illness/exacerbation;Comorbidity 3+;Transportation    Comorbidities  PMH of HTN, HLD, Type II DM, right basal ganglia stroke (08/2018), acute CVA, sinus bradycardia, RBBB, chronic knee pain, and peripheral neuropathy    Examination-Activity Limitations  Bathing;Bed Mobility;Dressing;Transfers;Squat;Lift;Locomotion Level;Stairs;Reach Overhead;Carry;Stand;Sit    Examination-Participation Restrictions  Interpersonal Relationship;Yard Work;Laundry;Cleaning;Community Activity;Meal Prep;Medication Management;Driving;Shop;Volunteer    Stability/Clinical Decision Making  Unstable/Unpredictable    Rehab Potential  Fair    PT Frequency  1x / week    PT Duration  8 weeks    PT Treatment/Interventions  ADLs/Self Care Home Management;Aquatic Therapy;Moist Heat;Cryotherapy;Gait training;Stair training;Functional mobility training;Therapeutic activities;Therapeutic exercise;Patient/family education;Neuromuscular re-education;Balance training;Orthotic Fit/Training;Manual techniques;Passive range of motion;Dry needling;Taping;Energy conservation;Splinting;Joint Manipulations;Spinal Manipulations;Electrical Stimulation;Ultrasound;Vestibular;Biofeedback;Iontophoresis 4mg /ml Dexamethasone;DME Instruction;Visual/perceptual remediation/compensation    PT Next Visit Plan  HEP, // bars, safety awareness     PT Home Exercise Plan  give next one    Consulted and Agree with Plan of Care  Patient;Family member/caregiver       Patient will benefit from skilled therapeutic intervention in order to improve the following deficits and impairments:  Abnormal gait, Decreased balance, Decreased endurance, Decreased mobility, Difficulty walking, Hypomobility, Increased muscle spasms, Impaired tone, Decreased range of motion, Decreased coordination, Decreased strength, Impaired UE functional use, Pain, Decreased activity tolerance, Decreased safety awareness, Impaired perceived functional ability, Impaired sensation, Improper body mechanics, Postural dysfunction, Decreased knowledge of use of DME, Decreased knowledge of precautions, Impaired flexibility, Impaired vision/preception  Visit Diagnosis: Muscle weakness (generalized)  Unsteadiness on feet  Other abnormalities of gait and mobility  Hemiplegia and hemiparesis following cerebral infarction affecting left non-dominant side (HCC)  Other lack of coordination  Dysarthria and anarthria  Dysphonia  Pain in left arm  Lacunar infarct, acute [facility])     Problem List Patient Active Problem List   Diagnosis Date Noted  . Paroxysmal atrial fibrillation (HCC) 10/26/2019  . Chest pain of uncertain etiology 09/05/2019  . History of CVA (cerebrovascular accident) 09/05/2019  . Hyperglycemia 09/05/2019  . Anemia 09/05/2019  . AKI (acute kidney injury) (HCC) 09/05/2019  . Atypical pneumonia 09/05/2019  . Coagulation disorder (HCC) 06/12/2019  . Gastroesophageal reflux disease   . Hemiplegia and hemiparesis following cerebral infarction affecting left non-dominant side (HCC) 11/25/2018  . 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 RBBB 10/15/2018  . Sinus bradycardia 10/15/2018  . Dysphagia, post-stroke   . Neuropathic pain   . Lacunar infarct, acute (HCC) 09/06/2018  . TIA (transient ischemic attack) 09/01/2018  . Microalbuminuria 07/29/2018  . Left leg pain  01/13/2018  . Hyperlipidemia LDL goal <70 02/11/2017  . Recurrent strokes (HCC) 11/10/2016  . Essential hypertension 11/10/2016  . Diabetes (HCC) 11/10/2016  . Tobacco abuse 08/28/2013    10/26/2013, Ezekiel Ina DPT 10/31/2019, 3:27 PM  [facility] MAIN [facility] SERVICES [address] [place], [place], [place] Phone: [phone]   Fax:  [phone]  Name: Evan KAMATH Sr. MRN: Sandie Ano Date of Birth: 1956/08/01

## 2019-11-01 ENCOUNTER — Telehealth: Payer: Self-pay

## 2019-11-01 DIAGNOSIS — E1165 Type 2 diabetes mellitus with hyperglycemia: Secondary | ICD-10-CM | POA: Diagnosis not present

## 2019-11-01 NOTE — Telephone Encounter (Signed)
Spoke with patient regarding results and recommendation.  Patient verbalizes understanding and is agreeable to plan of care. Advised patient to call back with any issues or concerns.  

## 2019-11-01 NOTE — Telephone Encounter (Signed)
-----   Message from [name], MD sent at 11/01/2019  7:45 AM EDT ----- No significant arrhythmia; prior 14 day monitor showed PAF, which has been discussed with the patient in person.  No further testing or intervention at this time.

## 2019-11-02 ENCOUNTER — Ambulatory Visit: Payer: Medicaid Other | Admitting: Podiatry

## 2019-11-02 ENCOUNTER — Other Ambulatory Visit: Payer: Self-pay

## 2019-11-02 ENCOUNTER — Encounter: Payer: Self-pay | Admitting: Podiatry

## 2019-11-02 DIAGNOSIS — B351 Tinea unguium: Secondary | ICD-10-CM

## 2019-11-02 DIAGNOSIS — E1142 Type 2 diabetes mellitus with diabetic polyneuropathy: Secondary | ICD-10-CM | POA: Diagnosis not present

## 2019-11-02 DIAGNOSIS — M79675 Pain in left toe(s): Secondary | ICD-10-CM | POA: Diagnosis not present

## 2019-11-02 DIAGNOSIS — M79674 Pain in right toe(s): Secondary | ICD-10-CM | POA: Diagnosis not present

## 2019-11-02 DIAGNOSIS — D689 Coagulation defect, unspecified: Secondary | ICD-10-CM

## 2019-11-02 DIAGNOSIS — E1165 Type 2 diabetes mellitus with hyperglycemia: Secondary | ICD-10-CM | POA: Diagnosis not present

## 2019-11-02 NOTE — Progress Notes (Signed)
This patient returns to my office for at risk foot care.  This patient requires this care by a professional since this patient will be at risk due to having diabetes and coagulation defect.This patient is unable to cut nails himself since the patient cannot reach his nails.These nails are painful walking and wearing shoes.  This patient presents for at risk foot care today.  General Appearance  Alert, conversant and in no acute stress.  Vascular  Dorsalis pedis and posterior tibial  pulses are palpable  bilaterally.  Capillary return is within normal limits  bilaterally. Temperature is within normal limits  bilaterally.  Neurologic  Senn-Weinstein monofilament wire test within normal limits  bilaterally. Muscle power within normal limits bilaterally.  Nails Thick disfigured discolored nails with subungual debris  from hallux to fifth toes bilaterally. No evidence of bacterial infection or drainage bilaterally.  Orthopedic  No limitations of motion  feet .  No crepitus or effusions noted.  No bony pathology or digital deformities noted.  Skin  normotropic skin with no porokeratosis noted bilaterally.  No signs of infections or ulcers noted.     Onychomycosis  Pain in right toes  Pain in left toes  Consent was obtained for treatment procedures.   Mechanical debridement of nails 1-5  bilaterally performed with a nail nipper.  Filed with dremel without incident. No infection or ulcer.  Healing blister right hallux.  Dispense padding.   Return office visit   3 months                   Told patient to return for periodic foot care and evaluation due to potential at risk complications.   Wesly Whisenant DPM  

## 2019-11-03 ENCOUNTER — Encounter: Payer: Medicaid Other | Admitting: Speech Pathology

## 2019-11-03 ENCOUNTER — Encounter: Payer: Medicaid Other | Admitting: Occupational Therapy

## 2019-11-03 ENCOUNTER — Ambulatory Visit: Payer: Medicaid Other

## 2019-11-03 DIAGNOSIS — E1165 Type 2 diabetes mellitus with hyperglycemia: Secondary | ICD-10-CM | POA: Diagnosis not present

## 2019-11-04 DIAGNOSIS — E1165 Type 2 diabetes mellitus with hyperglycemia: Secondary | ICD-10-CM | POA: Diagnosis not present

## 2019-11-05 DIAGNOSIS — E1165 Type 2 diabetes mellitus with hyperglycemia: Secondary | ICD-10-CM | POA: Diagnosis not present

## 2019-11-06 DIAGNOSIS — E1165 Type 2 diabetes mellitus with hyperglycemia: Secondary | ICD-10-CM | POA: Diagnosis not present

## 2019-11-07 ENCOUNTER — Encounter: Payer: Self-pay | Admitting: Occupational Therapy

## 2019-11-07 ENCOUNTER — Other Ambulatory Visit: Payer: Self-pay

## 2019-11-07 ENCOUNTER — Ambulatory Visit: Payer: Medicaid Other | Admitting: Occupational Therapy

## 2019-11-07 ENCOUNTER — Ambulatory Visit: Payer: Medicaid Other

## 2019-11-07 DIAGNOSIS — R490 Dysphonia: Secondary | ICD-10-CM | POA: Diagnosis not present

## 2019-11-07 DIAGNOSIS — R262 Difficulty in walking, not elsewhere classified: Secondary | ICD-10-CM | POA: Diagnosis not present

## 2019-11-07 DIAGNOSIS — M79602 Pain in left arm: Secondary | ICD-10-CM | POA: Diagnosis not present

## 2019-11-07 DIAGNOSIS — R278 Other lack of coordination: Secondary | ICD-10-CM

## 2019-11-07 DIAGNOSIS — E1165 Type 2 diabetes mellitus with hyperglycemia: Secondary | ICD-10-CM | POA: Diagnosis not present

## 2019-11-07 DIAGNOSIS — R2689 Other abnormalities of gait and mobility: Secondary | ICD-10-CM | POA: Diagnosis not present

## 2019-11-07 DIAGNOSIS — M6281 Muscle weakness (generalized): Secondary | ICD-10-CM

## 2019-11-07 DIAGNOSIS — I69354 Hemiplegia and hemiparesis following cerebral infarction affecting left non-dominant side: Secondary | ICD-10-CM | POA: Diagnosis not present

## 2019-11-07 DIAGNOSIS — I6381 Other cerebral infarction due to occlusion or stenosis of small artery: Secondary | ICD-10-CM | POA: Diagnosis not present

## 2019-11-07 DIAGNOSIS — R2681 Unsteadiness on feet: Secondary | ICD-10-CM

## 2019-11-07 DIAGNOSIS — R471 Dysarthria and anarthria: Secondary | ICD-10-CM | POA: Diagnosis not present

## 2019-11-07 NOTE — Therapy (Signed)
[facility] MAIN [facility] : [facility] SERVICES [address] [place], [place], [address] Phone: [phone]   Fax:  [phone]  Physical Therapy Treatment  Patient Details  Name: Evan JAQUITH Sr. MRN: [id number] Date of Birth: February 13, 1956 Referring Provider (PT): Hope Pigeon   Encounter Date: 11/07/2019  PT End of Session - 11/07/19 1243    Visit Number  6    Number of Visits  8    Date for PT Re-Evaluation  11/22/19    Authorization Type  2/10 eval 2/10; 1/3 tx session on 3/3 do goals for CAID    Authorization Time Period  authorized 9 visits 10/04/19- 12/05/19    Authorization - Visit Number  6    Authorization - Number of Visits  9    PT Start Time  0852    PT Stop Time  0930    PT Time Calculation (min)  38 min    Equipment Utilized During Treatment  Gait belt;Other (comment)   L swedish knee brace, AFO   Activity Tolerance  Patient tolerated treatment well    Behavior During Therapy  [facility] for tasks assessed/performed       Past Medical History:  Diagnosis Date  . Carotid arterial disease (HCC)    a. 08/2018 Carotid U/S: min-mod RICA atherosclerosis w/o hemodynamically significant stenosis. Nl LICA.  . Diabetes 1.5, managed as type 2 (HCC)   . Diastolic dysfunction    a. 08/2018 Echo: EF 65%. No rwma. Gr1 DD. Mild MR.  Marland Kitchen Hypercholesterolemia   . Hypertension   . Left hemiparesis (HCC)    a. Ambulated w/ Cane. Limited use of LUE.  . Pain in both feet   . Poorly controlled diabetes mellitus (HCC)    a. 04/2019 A1c 13.8.  Marland Kitchen Recurrent strokes (HCC)    a. 10/2016 MRI/A: Acute 38mm R thalamic infarct, ? subacute infarct of R corona radiata; b. 08/2017 MRI/A: Acute 71mm lateral L thalamic infarct. Other more remote lacunar infarcts of thalami bilat. Small vessel dzs; c. 08/2018 MRI/A: Acute lacunar infarct of the post limb of R internal capsule. Underlying advanced chrnoic small vessel dzs w/o hemodynamically significant stenosis.  . Tobacco abuse      Past Surgical History:  Procedure Laterality Date  . ESOPHAGOGASTRODUODENOSCOPY (EGD) WITH PROPOFOL N/A 04/26/2019   Procedure: ESOPHAGOGASTRODUODENOSCOPY (EGD) WITH PROPOFOL;  Surgeon: Toney Reil, MD;  Location[facility] ENDOSCOPY;  Service: Gastroenterology;  Laterality: N/A;    There were no vitals filed for this visit.  Subjective Assessment - 11/07/19 1242    Subjective  Patient reports he is eager to get a Lofstrand crutch. No falls or LOB since last session    Pertinent History  Patient is a pleasant 64 year old male returning to PT (evaluation on 09/04/19) with PMH of HTN, HLD, Type II DM, right basal ganglia stroke (08/2018), acute CVA, sinus bradycardia, RBBB, chronic knee pain, and peripheral neuropathy. Since patient has been seen last he has been admitted to the hospital three times and went to [facility] acute inpatient rehab for left paramedian pontine and right cerebellar ischemic stroke (09/07/19) discharged 09/23/19.  Per CIR documentation patient utilizes AFO and Swedish knee cage on LLE to prevent genu recurvatum. Patient was initially evaluated prior to recent hospitalization for L hemiplegia, went to inpatient rehab in 2020 for first CVA. Has an aide at home    Limitations  Lifting;Standing;Walking;House hold activities    How long can you sit comfortably?  n/a    How  long can you stand comfortably?  needs to hold onto objects    How long can you walk comfortably?  needs AD and CGA    Patient Stated Goals  to get back to fishing, walking better, balance.    Currently in Pain?  No/denies         Vitals at start of session: 143/84 pulse 71    Treatment:  Ambulate with unilateral Lofstrand crutch in RUE. Negotiate 5ft with CGA, cueing for weight shift. Decreased episodes of L knee hyperextension. x2 trials. Ambulate 415 ft with Lofstrand in RUE without rest breaks with increased gait compensatory mechanisms   Functional negotiation of room change: Opening, closing door  with RUE with R Lofstrand crutch, Cues for safety, sequencing, and spatial awareness. x5 minutes  Object negotiation: weave between 3 cones, step over half foam roller, weave between 2 cones. Challenged with stepping over obstacles, cues for proper sequencing with Lofstrand for stability x 4 trials.    Sit to stand from plinth table: cues for equal weight shift, front squat with PVC pipe in elbows 10x, max cueing for foot placement  Sit to stand from plinth table 5lb bar in elbows for front squat demand. 12x. Very challenging controlling eccentric portion however improved with repetition.    Vitals monitored throughout session to ensure HR and SPo2 within functional range with interventions                   PT Education - 11/07/19 1242    Education Details  exercise technique body mechanics    Person(s) Educated  Patient    Methods  Explanation;Demonstration;Tactile cues;Verbal cues    Comprehension  Verbalized understanding;Returned demonstration;Verbal cues required;Tactile cues required       PT Short Term Goals - 09/27/19 1653      PT SHORT TERM GOAL #1   Title  Patient will be independent with HEP for improved therapeutic gains and self-management.    Baseline  2/10: give next session    Time  4    Period  Weeks    Status  New    Target Date  10/25/19        PT Long Term Goals - 09/27/19 1653      PT LONG TERM GOAL #1   Title  Patient will increase Berg Balance score by > 6 points (30/56) to demonstrate decreased fall risk during functional activities.    Baseline  2/10: 24/56    Time  8    Period  Weeks    Status  New    Target Date  11/22/19      PT LONG TERM GOAL #2   Title  Patient will increase 10 meter walk test to >1.7m/s as to improve gait speed for better community ambulation and to reduce fall risk.    Baseline  2/10: 0.31 m/s with hurrycane and w/c follow    Time  8    Period  Weeks    Status  New    Target Date  11/22/19      PT LONG  TERM GOAL #3   Title  Patient (> 28 years old) will complete five times sit to stand test in < 15 seconds with no UE support indicating an increased LE strength and improved balance.    Baseline  2/10: 17 seconds heavy RUE support and use only of RLE    Time  8    Period  Weeks    Status  New  Target Date  11/22/19      PT LONG TERM GOAL #4   Title  Patient will increase BLE gross strength to 4+/5 as to improve functional strength for independent gait, increased standing tolerance and increased ADL ability.    Baseline  2/10: see evaluation note    Time  8    Period  Weeks    Status  New    Target Date  11/22/19            Plan - 11/07/19 1252    Clinical Impression Statement  Patient presents to physical therapy with excellent motivation. He continues to progress with functional mobility with use of Lofstrand crutch. Patient demonstrates decreased episodes of knee hyperextension and gait compensatory mechanisms with Lofstrand crutch. Stepping over obstacles continues to be challenging to patient at this time. Patient will continue to benefit from skilled PT to improve mobility and safety.    Personal Factors and Comorbidities  Age;Education;Sex;Social Background;Behavior Pattern;Finances;Past/Current Experience;Time since onset of injury/illness/exacerbation;Comorbidity 3+;Transportation    Comorbidities  PMH of HTN, HLD, Type II DM, right basal ganglia stroke (08/2018), acute CVA, sinus bradycardia, RBBB, chronic knee pain, and peripheral neuropathy    Examination-Activity Limitations  Bathing;Bed Mobility;Dressing;Transfers;Squat;Lift;Locomotion Level;Stairs;Reach Overhead;Carry;Stand;Sit    Examination-Participation Restrictions  Interpersonal Relationship;Yard Work;Laundry;Cleaning;Community Activity;Meal Prep;Medication Management;Driving;Shop;Volunteer    Stability/Clinical Decision Making  Unstable/Unpredictable    Rehab Potential  Fair    PT Frequency  1x / week    PT  Duration  8 weeks    PT Treatment/Interventions  ADLs/Self Care Home Management;Aquatic Therapy;Moist Heat;Cryotherapy;Gait training;Stair training;Functional mobility training;Therapeutic activities;Therapeutic exercise;Patient/family education;Neuromuscular re-education;Balance training;Orthotic Fit/Training;Manual techniques;Passive range of motion;Dry needling;Taping;Energy conservation;Splinting;Joint Manipulations;Spinal Manipulations;Electrical Stimulation;Ultrasound;Vestibular;Biofeedback;Iontophoresis 4mg /ml Dexamethasone;DME Instruction;Visual/perceptual remediation/compensation    PT Next Visit Plan  HEP, // bars, safety awareness    PT Home Exercise Plan  give next one    Consulted and Agree with Plan of Care  Patient;Family member/caregiver       Patient will benefit from skilled therapeutic intervention in order to improve the following deficits and impairments:  Abnormal gait, Decreased balance, Decreased endurance, Decreased mobility, Difficulty walking, Hypomobility, Increased muscle spasms, Impaired tone, Decreased range of motion, Decreased coordination, Decreased strength, Impaired UE functional use, Pain, Decreased activity tolerance, Decreased safety awareness, Impaired perceived functional ability, Impaired sensation, Improper body mechanics, Postural dysfunction, Decreased knowledge of use of DME, Decreased knowledge of precautions, Impaired flexibility, Impaired vision/preception  Visit Diagnosis: Muscle weakness (generalized)  Hemiplegia and hemiparesis following cerebral infarction affecting left non-dominant side (HCC)  Other lack of coordination  Unsteadiness on feet     Problem List Patient Active Problem List   Diagnosis Date Noted  . Paroxysmal atrial fibrillation (HCC) 10/26/2019  . Chest pain of uncertain etiology 09/05/2019  . History of CVA (cerebrovascular accident) 09/05/2019  . Hyperglycemia 09/05/2019  . Anemia 09/05/2019  . AKI (acute kidney  injury) (HCC) 09/05/2019  . Atypical pneumonia 09/05/2019  . Coagulation disorder (HCC) 06/12/2019  . Gastroesophageal reflux disease   . Hemiplegia and hemiparesis following cerebral infarction affecting left non-dominant side (HCC) 11/25/2018  . 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 RBBB 10/15/2018  . Sinus bradycardia 10/15/2018  . Dysphagia, post-stroke   . Neuropathic pain   . Lacunar infarct, acute (HCC) 09/06/2018  . TIA (transient ischemic attack) 09/01/2018  . Microalbuminuria 07/29/2018  . Left leg pain 01/13/2018  . Hyperlipidemia LDL goal <70 02/11/2017  . Recurrent strokes (HCC) 11/10/2016  . Essential hypertension 11/10/2016  . Diabetes (HCC) 11/10/2016  . Tobacco abuse 08/28/2013  Precious Bard, PT, DPT   11/07/2019, 12:53 PM  [facility] MAIN [facility] SERVICES [address] [place], [place], [address] Phone: [phone]   Fax:  [phone]  Name: Evan BUNTON Sr. MRN: [id number] Date of Birth: [birth year]

## 2019-11-07 NOTE — Therapy (Signed)
[facility] MAIN [facility] SERVICES [address] [place], [place], [address] Phone: [phone]   Fax:  [phone]  Occupational Therapy Treatment  Patient Details  Name: Evan WORTH Sr. MRN: [id number] Date of Birth: 02-01-1956 Referring Provider (OT): Hope Pigeon   Encounter Date: 11/07/2019  OT End of Session - 11/07/19 1042    Visit Number  7    Number of Visits  24    Date for OT Re-Evaluation  12/20/19    Authorization Type  Medicaid    OT Start Time  0930    OT Stop Time  1015    OT Time Calculation (min)  45 min    Activity Tolerance  Patient tolerated treatment well    Behavior During Therapy  [facility] for tasks assessed/performed       Past Medical History:  Diagnosis Date  . Carotid arterial disease (HCC)    a. 08/2018 Carotid U/S: min-mod RICA atherosclerosis w/o hemodynamically significant stenosis. Nl LICA.  . Diabetes 1.5, managed as type 2 (HCC)   . Diastolic dysfunction    a. 08/2018 Echo: EF 65%. No rwma. Gr1 DD. Mild MR.  Marland Kitchen Hypercholesterolemia   . Hypertension   . Left hemiparesis (HCC)    a. Ambulated w/ Cane. Limited use of LUE.  . Pain in both feet   . Poorly controlled diabetes mellitus (HCC)    a. 04/2019 A1c 13.8.  Marland Kitchen Recurrent strokes (HCC)    a. 10/2016 MRI/A: Acute 93mm R thalamic infarct, ? subacute infarct of R corona radiata; b. 08/2017 MRI/A: Acute 78mm lateral L thalamic infarct. Other more remote lacunar infarcts of thalami bilat. Small vessel dzs; c. 08/2018 MRI/A: Acute lacunar infarct of the post limb of R internal capsule. Underlying advanced chrnoic small vessel dzs w/o hemodynamically significant stenosis.  . Tobacco abuse     Past Surgical History:  Procedure Laterality Date  . ESOPHAGOGASTRODUODENOSCOPY (EGD) WITH PROPOFOL N/A 04/26/2019   Procedure: ESOPHAGOGASTRODUODENOSCOPY (EGD) WITH PROPOFOL;  Surgeon: Toney Reil, MD;  Location: [facility] ENDOSCOPY;  Service: Gastroenterology;  Laterality:  N/A;    There were no vitals filed for this visit.  Subjective Assessment - 11/07/19 1041    Subjective   Pt reports left shoulder neck pain, tightness.    Patient is accompanied by:  Family member    Pertinent History  Pt. is a 64 y.o. male with a history of a CVA with left hemiplegia one year ago was admitted to [facility] on 09/04/2019 with Atypical Pneumonia, Diabetic Ketoacidosis without coma. Pt. was then admitted to [facility] received inpatient rehabilitation services. Pt. resides with his wife who assists with all ADLs, and IADLs. Pt. was previously indepedent with ADL tasks, worked in Museum/gallery curator, and enjoys fishing.    Patient Stated Goals  To be able to put his shirt on by himself    Currently in Pain?  No/denies    Pain Score  7     Pain Location  Neck   Neck and shoulder   Pain Orientation  Left    Pain Descriptors / Indicators  Tightness    Pain Type  Acute pain    Pain Onset  Yesterday      OT TREATMENT    Neuro muscular re-education:  Pt. Tolerated weightbearing, and proprioceptive input through the LUE, and hand to inhibit tone, and prepare the UE for ROM, and facilitation of active movement. Pt. workedonweightbearing with a flat open hand with slow forward rocking, and preparing the  hand for weightbearing position when seated. Pt. worked on alternating weightbearing with functional reaching.  Therapeutic Exercise:  Pt. tolerated PROM/AAROM/AROM in all joint ranges of the LUE, and hand with slow gentle stretching is tone reducing patterns.   Manual Therapy:  Pt. Tolerated scapular mobilization in elevation, depression, and rotation while in sidelying to prepare the LUE for ROM. Pt. Tolerated soft tissue mobilizations of radius on ulna for supination secondary to tone, and tightness in pronators. Pt. Tolerated soft tissue mobilizations metacarpal spread stretches to the left hand prior to ROM. Manual techniques were performed prior to, and independent of ROM/ther.  Ex.  Orthotic Fitting:  Pt. Tolerated initial left resting hand splint fitting. Pt., and caregiver education was provided about splint application, recommended wearing schedule, and splint care. Handouts with instructions were provided to the pt., and caregiver.   Pt. presented with 7/10 left shoulder/neck pain prior to the session. Pain was reduced to 3/10 after the treatment session. Pt. Tolerated moist heat modality, there. Ex, and manual techniques well. Pt. Tolerated initial left resting hand splint fitting. Pt. continues to present with flexor tone, tightness, and spasticity in his left UE, and hand.  Pt. continues to work on improving, and normalizing tone in the LUE, and facilitating increased active volitional movement.                          OT Education - 11/07/19 1042    Education Details  LUE ROM    Person(s) Educated  Patient    Methods  Explanation    Comprehension  Verbalized understanding;Returned demonstration          OT Long Term Goals - 09/27/19 1711      OT LONG TERM GOAL #1   Title  Pt. will be independent with donning his shirt using one armed dressing techniques    Baseline  Eval: Pt. requires assist form caregiver    Time  12    Period  Weeks    Status  New    Target Date  12/20/19      OT LONG TERM GOAL #2   Title  Pt. will donn LE dressing with modified independence    Baseline  Eval: MaxA    Time  12    Period  Weeks    Status  New    Target Date  12/20/19      OT LONG TERM GOAL #3   Title  Pt. will improve left digit flexion to be able to hold a toothbrush while applying toothpaste.    Baseline  Eval: Pt. is unable    Time  12    Period  Weeks    Status  New    Target Date  12/20/19      OT LONG TERM GOAL #4   Title  Pt. will improve left shoulder flexion,and abduction AROM by 20 degrees to be able to reach his head in preparation for brushing his hair.    Baseline  Eval: Pt. is unable    Time  12    Period   Weeks    Status  New    Target Date  12/20/19      OT LONG TERM GOAL #5   Title  Pt. will be independent with HEP for the LUE, and hand    Baseline  Eval: Pt. does not have a HEP    Time  12    Period  Weeks  Status  New    Target Date  12/20/19            Plan - 11/07/19 1047    Clinical Impression Statement Pt. presented with 7/10 left shoulder/neck pain prior to the session. Pain was reduced to 3/10 after the treatment session. Pt. Tolerated moist heat modality, there. Ex, and manual techniques well. Pt. Tolerated initial left resting hand splint fitting. Pt. continues to present with flexor tone, tightness, and spasticity in his left UE, and hand.  Pt. continues to work on improving, and normalizing tone in the LUE, and facilitating increased active volitional movement.     OT Occupational Profile and History  Detailed Assessment- Review of Records and additional review of physical, cognitive, psychosocial history related to current functional performance    Occupational performance deficits (Please refer to evaluation for details):  ADL's;IADL's    Body Structure / Function / Physical Skills  ADL;GMC;Strength;IADL;FMC    Rehab Potential  Good    Clinical Decision Making  Multiple treatment options, significant modification of task necessary    Modification or Assistance to Complete Evaluation   Max significant modification of tasks or assist is necessary to complete    OT Frequency  2x / week    OT Treatment/Interventions  Self-care/ADL training;Therapeutic exercise;Patient/family education;DME and/or AE instruction;Neuromuscular education;Manual Therapy;Therapeutic activities;Splinting;Moist Heat;Electrical Stimulation    OT Home Exercise Plan  More stretching exercises on tabletop and moist heat at home    Consulted and Agree with Plan of Care  Patient;Family member/caregiver    Family Member Consulted  wife       Patient will benefit from skilled therapeutic intervention  in order to improve the following deficits and impairments:   Body Structure / Function / Physical Skills: ADL, GMC, Strength, IADL, [place]       Visit Diagnosis: Muscle weakness (generalized)  Other lack of coordination    Problem List Patient Active Problem List   Diagnosis Date Noted  . Paroxysmal atrial fibrillation ([place]) 10/26/2019  . Chest pain of uncertain etiology 02/58/5277  . History of CVA (cerebrovascular accident) 09/05/2019  . Hyperglycemia 09/05/2019  . Anemia 09/05/2019  . AKI (acute kidney injury) ([place]) 09/05/2019  . Atypical pneumonia 09/05/2019  . Coagulation disorder ([place]) 06/12/2019  . Gastroesophageal reflux disease   . Hemiplegia and hemiparesis following cerebral infarction affecting left non-dominant side ([place]) 11/25/2018  . 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 RBBB 10/15/2018  . Sinus bradycardia 10/15/2018  . Dysphagia, post-stroke   . Neuropathic pain   . Lacunar infarct, acute ([place]) 09/06/2018  . TIA (transient ischemic attack) 09/01/2018  . Microalbuminuria 07/29/2018  . Left leg pain 01/13/2018  . Hyperlipidemia LDL goal <70 02/11/2017  . Recurrent strokes ([place]) 11/10/2016  . Essential hypertension 11/10/2016  . Diabetes ([place]) 11/10/2016  . Tobacco abuse 08/28/2013    Harrel Carina, MS, OTR/L 11/07/2019, 10:49 AM  [place] MAIN [facility] SERVICES [address] [place], [place], [address] Phone: [phone]   Fax:  [phone]  Name: Evan MEINHARDT Sr. MRN: [id number] Date of Birth: May 07, 1956

## 2019-11-08 DIAGNOSIS — E1165 Type 2 diabetes mellitus with hyperglycemia: Secondary | ICD-10-CM | POA: Diagnosis not present

## 2019-11-09 ENCOUNTER — Ambulatory Visit: Payer: Medicaid Other | Admitting: Speech Pathology

## 2019-11-09 ENCOUNTER — Other Ambulatory Visit: Payer: Self-pay

## 2019-11-09 ENCOUNTER — Encounter: Payer: Self-pay | Admitting: Speech Pathology

## 2019-11-09 DIAGNOSIS — R2689 Other abnormalities of gait and mobility: Secondary | ICD-10-CM | POA: Diagnosis not present

## 2019-11-09 DIAGNOSIS — M79602 Pain in left arm: Secondary | ICD-10-CM | POA: Diagnosis not present

## 2019-11-09 DIAGNOSIS — R2681 Unsteadiness on feet: Secondary | ICD-10-CM | POA: Diagnosis not present

## 2019-11-09 DIAGNOSIS — I69354 Hemiplegia and hemiparesis following cerebral infarction affecting left non-dominant side: Secondary | ICD-10-CM | POA: Diagnosis not present

## 2019-11-09 DIAGNOSIS — E1165 Type 2 diabetes mellitus with hyperglycemia: Secondary | ICD-10-CM | POA: Diagnosis not present

## 2019-11-09 DIAGNOSIS — R262 Difficulty in walking, not elsewhere classified: Secondary | ICD-10-CM | POA: Diagnosis not present

## 2019-11-09 DIAGNOSIS — R490 Dysphonia: Secondary | ICD-10-CM | POA: Diagnosis not present

## 2019-11-09 DIAGNOSIS — M6281 Muscle weakness (generalized): Secondary | ICD-10-CM | POA: Diagnosis not present

## 2019-11-09 DIAGNOSIS — R471 Dysarthria and anarthria: Secondary | ICD-10-CM

## 2019-11-09 DIAGNOSIS — R278 Other lack of coordination: Secondary | ICD-10-CM | POA: Diagnosis not present

## 2019-11-09 DIAGNOSIS — I6381 Other cerebral infarction due to occlusion or stenosis of small artery: Secondary | ICD-10-CM | POA: Diagnosis not present

## 2019-11-09 NOTE — Therapy (Signed)
[facility] MAIN [facility] SERVICES [address] [place], [place], [address] Phone: [phone]   Fax:  [phone]  Speech Language Pathology Treatment  Patient Details  Name: Evan NETTER Sr. MRN: [id number] Date of Birth: 1956/02/19 Referring Provider (SLP): Franz Dell   Encounter Date: 11/09/2019  End of Session - 11/09/19 1245    Visit Number  3    Number of Visits  8    Date for SLP Re-Evaluation  12/15/19    Authorization Type  Medicaid    Authorization - Visit Number  3    Authorization - Number of Visits  10    SLP Start Time  0900    SLP Stop Time   0950    SLP Time Calculation (min)  50 min    Activity Tolerance  Patient tolerated treatment well       Past Medical History:  Diagnosis Date  . Carotid arterial disease (HCC)    a. 08/2018 Carotid U/S: min-mod RICA atherosclerosis w/o hemodynamically significant stenosis. Nl LICA.  . Diabetes 1.5, managed as type 2 (HCC)   . Diastolic dysfunction    a. 08/2018 Echo: EF 65%. No rwma. Gr1 DD. Mild MR.  Marland Kitchen Hypercholesterolemia   . Hypertension   . Left hemiparesis (HCC)    a. Ambulated w/ Cane. Limited use of LUE.  . Pain in both feet   . Poorly controlled diabetes mellitus (HCC)    a. 04/2019 A1c 13.8.  Marland Kitchen Recurrent strokes (HCC)    a. 10/2016 MRI/A: Acute 48mm R thalamic infarct, ? subacute infarct of R corona radiata; b. 08/2017 MRI/A: Acute 45mm lateral L thalamic infarct. Other more remote lacunar infarcts of thalami bilat. Small vessel dzs; c. 08/2018 MRI/A: Acute lacunar infarct of the post limb of R internal capsule. Underlying advanced chrnoic small vessel dzs w/o hemodynamically significant stenosis.  . Tobacco abuse     Past Surgical History:  Procedure Laterality Date  . ESOPHAGOGASTRODUODENOSCOPY (EGD) WITH PROPOFOL N/A 04/26/2019   Procedure: ESOPHAGOGASTRODUODENOSCOPY (EGD) WITH PROPOFOL;  Surgeon: Toney Reil, MD;  Location: [facility] ENDOSCOPY;  Service:  Gastroenterology;  Laterality: N/A;    There were no vitals filed for this visit.  Subjective Assessment - 11/09/19 1243    Subjective  Patient was engaged throughout the session.            ADULT SLP TREATMENT - 11/09/19 0001      General Information   Behavior/Cognition  Alert;Cooperative;Pleasant mood      Treatment Provided   Treatment provided  Cognitive-Linquistic      Pain Assessment   Pain Assessment  No/denies pain      Cognitive-Linquistic Treatment   Treatment focused on  Voice    Skilled Treatment  The QoL-DyS a 40-question survey that measures the impact of the patient's voice on their daily lives.  The patient's total composite score was a 77, indicating mild/moderate impact on quality of life. Overall the patient demonstrates independent use of compensatory strategies but it appears that the patient does feel that his speech quality has negatively changed. He also indicated that he feels situational circumstances have the power to negatively impact his speech. The patient completed an unvoiced flow phonation task and some structured articulation tasks targeting breath control and clear articulation. He required multiple cues to articulate 3-syllable words with clear speech, but was 100% accurate on monosyllabic and disyllabic words.      Assessment / Recommendations / Plan   Plan  Continue with current plan of care      Progression Toward Goals   Progression toward goals  Progressing toward goals       SLP Education - 11/09/19 1244    Education Details  flow phonation, clear articulation    Person(s) Educated  Patient    Methods  Explanation    Comprehension  Verbalized understanding         SLP Long Term Goals - 10/20/19 1055      SLP LONG TERM GOAL #1   Title  The patient will demonstrate independent completion of extrinsic laryngeal muscle stretches.    Baseline  Patient very tight, requires supervision for accurate performance    Time  8     Period  Weeks    Status  New    Target Date  12/15/19      SLP LONG TERM GOAL #2   Title  The patient will be independent for abdominal breathing and breath support exercises.    Baseline  Patient requires max cues to execute accurately    Time  8    Period  Weeks    Status  New    Target Date  12/15/19      SLP LONG TERM GOAL #3   Title  The patient will minimize vocal tension via flow phonation and/or resonant voice therapy (or comparable techniques) with min SLP cues with 80% accuracy.    Baseline  Tight/strained vocal quality    Time  8    Period  Weeks    Status  New    Target Date  12/15/19      SLP LONG TERM GOAL #4   Title  Pt will improve speech intelligibility for sentences by controlling rate of speech, over-articulation, and increased loudness to achieve 80% intelligibility.    Baseline  Patient cannot increase volume without vocal strain    Time  8    Period  Weeks    Status  New    Target Date  12/15/19       Plan - 11/09/19 1245    Clinical Impression Statement  At one month post onset of an acute cerebellar infarct, the patient presents with mild-moderate dysarthria characterized by imprecise articulation and strained vocal quality with limited pitch and volume control. His poor motor control and recent onset of drooling is suspected to be due to mild apraxia. He will benefit from skilled speech therapy for education, to improve speech intelligibility, improve breath support, promote easy flow phonation, and learn techniques to increase loudness and pitch range without strain.    Speech Therapy Frequency  1x /week    Duration  Other (comment)    Potential to Achieve Goals  Good    Potential Considerations  Ability to learn/carryover information;Previous level of function;Severity of impairments;Cooperation/participation level;Family/community support    SLP Home Exercise Plan  Provided    Consulted and Agree with Plan of Care  Patient;Family member/caregiver     Family Member Consulted  Spouse       Patient will benefit from skilled therapeutic intervention in order to improve the following deficits and impairments:   Dysarthria and anarthria    Problem List Patient Active Problem List   Diagnosis Date Noted  . Paroxysmal atrial fibrillation ([place]) 10/26/2019  . Chest pain of uncertain etiology 08/25/3233  . History of CVA (cerebrovascular accident) 09/05/2019  . Hyperglycemia 09/05/2019  . Anemia 09/05/2019  . AKI (acute kidney injury) ([place]) 09/05/2019  . Atypical pneumonia 09/05/2019  .  Coagulation disorder (HCC) 06/12/2019  . Gastroesophageal reflux disease   . Hemiplegia and hemiparesis following cerebral infarction affecting left non-dominant side (HCC) 11/25/2018  . 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 RBBB 10/15/2018  . Sinus bradycardia 10/15/2018  . Dysphagia, post-stroke   . Neuropathic pain   . Lacunar infarct, acute (HCC) 09/06/2018  . TIA (transient ischemic attack) 09/01/2018  . Microalbuminuria 07/29/2018  . Left leg pain 01/13/2018  . Hyperlipidemia LDL goal <70 02/11/2017  . Recurrent strokes (HCC) 11/10/2016  . Essential hypertension 11/10/2016  . Diabetes (HCC) 11/10/2016  . Tobacco abuse 08/28/2013    Yarethzy Croak 11/09/2019, 12:48 PM  [place] [facility] MAIN [facility] SERVICES [address] [place], [place], [address] Phone: [phone]   Fax:  [phone]   Name: Evan BATDORF Sr. MRN: [id number] Date of Birth: 1955/10/20

## 2019-11-10 DIAGNOSIS — E1165 Type 2 diabetes mellitus with hyperglycemia: Secondary | ICD-10-CM | POA: Diagnosis not present

## 2019-11-10 NOTE — Progress Notes (Signed)
Patient ID: Evan DIROCCO Sr., male    DOB: 18-Apr-1956, 64 y.o.   MRN: [id number]  PCP: Hubbard Hartshorn, FNP  Chief Complaint  Patient presents with  . Follow-up    History of stroke  . Diabetes  . Gastroesophageal Reflux    pantoprzole not helping with reflux  . Hyperlipidemia    Subjective:   Evan DEGRAZIA Sr. is a 64 y.o. male, presents to clinic with CC of the following:  Chief Complaint  Patient presents with  . Follow-up    History of stroke  . Diabetes  . Gastroesophageal Reflux    pantoprzole not helping with reflux  . Hyperlipidemia    HPI:  Patient is a 64 year old male who I first saw on follow-up after hospitalization on 10/02/2019. He presents for follow-up today.  He was admitted at [facility] with chest pain in the setting of severely elevated blood sugar requiring insulin infusion. Stress test was low risk without ischemia, with some diaphragmatic attenuation noted. LVEF was normal by echo. Medical therapy was recommended. He presented to [facility] ED the day after discharge with dysarthria, left facial droop, and worsening left-sided weakness. MRI showed evidence of acute stroke in the left paramedian pons and right cerebellar hemisphere. He was transferred to acute inpatient rehab and discharged home on 09/23/2019.  On recent f/u with cardiology on 10/26/19, it was noted his left-sided weakness was back to baseline and he had not developed any new focal neurologic deficits.  He continued to experience occasional palpitations without associated symptoms.  He denied further chest pain, shortness of breath, lightheadedness, and edema.  He had not had any falls or bleeding recent past.   His event monitor showed paroxysmal atrial fibrillation lasting up to just over an hour at a time with overall relatively low burden.  However, given history of recurrent strokes in multiple vascular distributions, remain concerned about cardioembolic source.  I have recommended  discontinuation of aspirin and clopidogrel and initiation of therapeutic anticoagulation in the setting of a CHA2DS2-VASc score of at least 4 (hypertension, diabetes, and stroke x2).  We have discussed treatment options and have agreed to initiate apixaban 5 mg twice daily.  Recent CBC was normal.  However mild renal insufficiency was noted on most recent labs.  We will therefore repeat a basic metabolic panel today to ensure his renal function has not worsened.  Continue statin therapy for secondary prevention.  CKD: He had BMP done on 10/26/2019 showed an elevated glucose at 242, and his serum creatinine was slightly higher at 1.82, With his GFR reduced some from previous.  It was recommended to stop the hydrochlorothiazide by the cardiologist, increase his water intake, and was to recheck again in another week.  No follow-up BMP was obtained as of yet.  He has continued rehabilitation 2 times a week, and speech therapy included.   He is limited with his verbal responses, but was much better today with more than just a one-word response like prior, with his wife helping a lot with some of the history.    He is currently having speech therapy and that has been helpful.   He notes occasional pain in his left hand greater than right hand, and left knee, left foot with his left side affected after the CVA, and has some numbness at times in his fingers and feet on the left side.  No chest pains or shortness of breath.  He is currently taking the gabapentin product.  (  600 mg twice daily).  He was inquiring if there was something more he could take for pain.  He was disappointed his pain management visit was over the phone, and there was no follow-up.  He has not followed up with neurology, with this a neuropathy component, and did recommend that he call them to follow-up and get their help in this regard.  HTN: Notes Ok when checked at rehab.Taking amlodipine 15m, HCTZ, lisinopril.  Not checking BP's at  home.Denies chest pain, shortness of breath. Cardiology has been involved as well. BP Readings from Last 3 Encounters:  10/26/19 120/74  10/25/19 125/70  10/17/19 116/62    HLD: Taking atorvastatin daily - 80 mg  Lab Results  Component Value Date   CHOL 131 01/31/2019   HDL 47 01/31/2019   LDLCALC 54 01/31/2019   TRIG 239 (H) 01/31/2019   CHOLHDL 2.8 01/31/2019   Hx Stroke/LEFT Hemiplegia:Stroke in January 2019, affecting LEFT side function; was wearing brace on the LEFT LE.Recurrent stroke as above noted, currently in rehab after. This stroke affected speech. Now anticoagulated per cardiology rec's, remains on statin.  Left side pain noted prior to most recent CDGU:[id number] He thinks he sleeps on it wrong sometimes and this causes the pain.Baclofen was tried and referred to pain management and saw 10/10/2019. On neurontin.  GERD: Had prior to recent CVA,  On protonix.  He notes that he has a frequent reflux feeling of acid into his mouth, and is bothersome.  Does not feel like the Protonix is that helpful presently.  Denies any vomiting, although did note that his stools were black a week and 1/2 to 2 weeks ago, although they are now back to normal.  Denies any marked abdominal pains.  Diabetes mellitustype 2-he was referred to endocrine after our last visit, not yet been seen by them, and upon looking up, he was a no-show for that appointment and it was noted he will need another referral to be seen. wife notes that his blood sugars have not been well controlled since home, and thinks he needs to take more insulin..  On checks, it is usually in the 130 -200 range.  He is currently taking 10 units/day of his insulin, given in the morning.  He was on 30 units before he went into the hospital.   Last eye exam:Noted was a couple months ago, glasses prescribed. Lab Results  Component Value Date   HGBA1C 13.2 (H) 10/02/2019   HGBA1C 14.3 (H) 09/06/2019   HGBA1C 13.8 (A)  05/04/2019   Lab Results  Component Value Date   MICROALBUR 32.3 01/31/2019   LDLCALC 54 01/31/2019   CREATININE 1.82 (H) 10/26/2019    Current Medication Management: Insulin and Taking glipizide 527mdaily. Had taken metformin in past but gave him significant diarrhea.  ACEI/ARB:Yes Statin:Yes Aspirin therapy:Yes  Tobacco dep -he has not used tobacco since he left the hospital, and does use nicotine patches to help him with urges.  Continued complete cessation strongly encouraged.     Patient Active Problem List   Diagnosis Date Noted  . Paroxysmal atrial fibrillation ([facility]03/06/2020  . Chest pain of uncertain etiology [id number]/[id number]. History of CVA (cerebrovascular accident) 09/05/2019  . Hyperglycemia 09/05/2019  . Anemia 09/05/2019  . AKI (acute kidney injury) ([facility]01/19/2021  . Atypical pneumonia 09/05/2019  . Coagulation disorder ([facility]10/26/2020  . Gastroesophageal reflux disease   . Hemiplegia and hemiparesis following cerebral infarction affecting left non-dominant side ([facility]04/05/2019  . Tooth  disease 10/20/2018  . RBBB 10/15/2018  . Sinus bradycardia 10/15/2018  . Dysphagia, post-stroke   . Neuropathic pain   . Lacunar infarct, acute ([place]) 09/06/2018  . TIA (transient ischemic attack) 09/01/2018  . Microalbuminuria 07/29/2018  . Left leg pain 01/13/2018  . Hyperlipidemia LDL goal <70 02/11/2017  . Recurrent strokes ([place]) 11/10/2016  . Essential hypertension 11/10/2016  . Diabetes ([place]) 11/10/2016  . Tobacco abuse 08/28/2013      Current Outpatient Medications:  .  amLODipine (NORVASC) 10 MG tablet, Take 1 tablet (10 mg total) by mouth daily., Disp: 90 tablet, Rfl: 3 .  apixaban (ELIQUIS) 5 MG TABS tablet, Take 1 tablet (5 mg total) by mouth 2 (two) times daily., Disp: 180 tablet, Rfl: 1 .  atorvastatin (LIPITOR) 80 MG tablet, Take 1 tablet (80 mg total) by mouth every evening., Disp: 30 tablet, Rfl: 0 .  baclofen (LIORESAL) 10 MG tablet, TAKE 1  TABLET BY MOUTH 3 TIMES DAILY, Disp: 30 each, Rfl: 2 .  Blood Glucose Monitoring Suppl (FIFTY50 GLUCOSE METER 2.0) w/Device KIT, Use as instructed, Disp: , Rfl:  .  gabapentin (NEURONTIN) 300 MG capsule, Take 2 capsules (600 mg total) by mouth 2 (two) times daily., Disp: 360 capsule, Rfl: 1 .  glipiZIDE (GLUCOTROL) 5 MG tablet, TAKE 1 TABLET BY MOUTH DAILY BEFORE BREAKFAST, Disp: 90 tablet, Rfl: 1 .  insulin detemir (LEVEMIR) 100 UNIT/ML injection, Inject 10 Units into the skin daily., Disp: , Rfl:  .  Insulin Pen Needle (NOVOFINE) 32G X 6 MM MISC, 1 each by Does not apply route once a week., Disp: 100 each, Rfl: 1 .  lisinopril (ZESTRIL) 40 MG tablet, TAKE 1 TABLET BY MOUTH DAILY, Disp: 90 tablet, Rfl: 1 .  pantoprazole (PROTONIX) 40 MG tablet, TAKE 1 TABLET BY MOUTH DAILY BEFORE BREAKFAST, Disp: 30 tablet, Rfl: 2   No Known Allergies   Past Surgical History:  Procedure Laterality Date  . ESOPHAGOGASTRODUODENOSCOPY (EGD) WITH PROPOFOL N/A 04/26/2019   Procedure: ESOPHAGOGASTRODUODENOSCOPY (EGD) WITH PROPOFOL;  Surgeon: Lin Landsman, MD;  Location: [facility];  Service: Gastroenterology;  Laterality: N/A;     Family History  Problem Relation Age of Onset  . Hypertension Mother   . Stroke Mother        died @ age 29  . Hypertension Father   . Diabetes Father   . Heart attack Father        died @ 65     Social History   Tobacco Use  . Smoking status: Former Smoker    Packs/day: 1.00    Years: 38.00    Pack years: 38.00    Types: Cigarettes    Start date: 1982  . Smokeless tobacco: Never Used  Substance Use Topics  . Alcohol use: Not Currently    Alcohol/week: 1.0 standard drinks    Types: 1 Cans of beer per week    Comment: previously drank but nothing in 1-2 yrs (08/2019).    With staff assistance, above reviewed with the patient/caregiver today.  ROS: As per HPI, otherwise no specific complaints on a limited and focused system review   No results found for  this or any previous visit (from the past 72 hour(s)).   PHQ2/9: Depression screen [facility] 2/9 11/13/2019 10/02/2019 07/18/2019 04/14/2019 01/31/2019  Decreased Interest 0 0 0 0 0  Down, Depressed, Hopeless 0 0 0 0 0  PHQ - 2 Score 0 0 0 0 0  Altered sleeping 3 3 0 0 0  Tired, decreased energy 0 1 0 0 0  Change in appetite 0 3 0 0 0  Feeling bad or failure about yourself  0 0 0 0 0  Trouble concentrating 0 0 0 0 0  Moving slowly or fidgety/restless 0 3 0 0 0  Suicidal thoughts 0 0 0 0 0  PHQ-9 Score 3 10 0 0 0  Difficult doing work/chores Not difficult at all Not difficult at all - Not difficult at all Not difficult at all   PHQ-2/9 Result is neg  Fall Risk: Fall Risk  11/13/2019 10/02/2019 07/18/2019 04/14/2019 01/31/2019  Falls in the past year? 0 0 0 0 0  Number falls in past yr: 0 0 0 0 0  Injury with Fall? 0 0 0 0 0  Follow up - - - - -      Objective:   Vitals:   11/13/19 1305  BP: 116/78  Pulse: 65  Resp: 16  Temp: 97.9 F (36.6 C)  TempSrc: Temporal  SpO2: 98%  Weight: 163 lb (73.9 kg)  Height: 5' 6"  (1.676 m)    Body mass index is 26.31 kg/m.  Physical Exam   NAD, masked, more attentive and interactive today. HEENT - [place]/AT, sclera anicteric, PERRL, EOMI, conj - non-inj'ed,  pharynx clear Neck - supple, no adenopathy, carotids 2+ and = without bruits bilat Car - RRR without m/g/r Pulm- RR and effort normal at rest, CTA without wheeze or rales Abd - soft, NT diffusely, no focal epigastric discomfort Back - no CVA tenderness Ext - no LE edema, Neuro/psychiatric -more attentive and interactive, could respond with more than 1 word answers, although still limited in responses.  Alert   Left-sided weakness persists, slight dysarthria present.  In a wheelchair in the office.  Results for orders placed or performed in visit on 29/56/21  Basic Metabolic Panel (BMET)  Result Value Ref Range   Glucose 242 (H) 65 - 99 mg/dL   BUN 26 8 - 27 mg/dL   Creatinine, Ser 1.82  (H) 0.76 - 1.27 mg/dL   GFR calc non Af Amer 39 (L) >59 mL/min/1.73   GFR calc Af Amer 45 (L) >59 mL/min/1.73   BUN/Creatinine Ratio 14 10 - 24   Sodium 141 134 - 144 mmol/L   Potassium 4.2 3.5 - 5.2 mmol/L   Chloride 101 96 - 106 mmol/L   CO2 22 20 - 29 mmol/L   Calcium 9.6 8.6 - 10.2 mg/dL       Assessment & Plan:   1. Hemiplegia and hemiparesis following cerebral infarction affecting left non-dominant side (HCC) Continue with PT and speech therapy.  He has had improvements noted in the recent past, certainly since last visit. His Plavix and aspirin were stopped, with the anticoagulant started per cardiology's recommendations (apixaban)  2. Type 2 diabetes mellitus with other circulatory complication, with long-term current use of insulin (HCC) Emphasized the need to have endocrinology involved and the importance of keeping an appointment that is scheduled.  Agree that his blood sugars need to be better controlled. Agreed to increase the baseline insulin 3 units, up to 13 units daily presently, as do not want to have any hypoglycemic concerns. Continue to check sugars at home also important. We will check a BMP and A1c today. Hopes to have endocrine input soon.  Another referral was completed.  3. Essential hypertension Continue with medications with the HCTZ stopped. Continue to monitor. Cardiology has been involved as well with helping with blood pressure management.  4. Mixed hyperlipidemia Continue with statin. We will check a lipid panel in the future, preferably when he is somewhat fasting and not after he has just eaten a meal.  5. Recurrent strokes ([place]) Continuing with rehab presently.  6. Gastroesophageal reflux disease without esophagitis Will refer to gastroenterology with the increasing reflux, not responsive to Protonix.  Concern with the dark stools noted a week and a half ago, with that now more recently improved.  Especially noting he is now on apixaban. We  will check a CBC today. Await GI input.  Continue the Protonix presently  7. Paroxysmal atrial fibrillation (HCC) As noted above, now on apixaban per cardiology input.  8. Stage 3a chronic kidney disease Check a BMP again today.   Follow-up at the latest in 3 months time, sooner as needed. Await current labs today.     Towanda Malkin, MD 11/13/19 1:20 PM

## 2019-11-11 DIAGNOSIS — E1165 Type 2 diabetes mellitus with hyperglycemia: Secondary | ICD-10-CM | POA: Diagnosis not present

## 2019-11-12 DIAGNOSIS — E1165 Type 2 diabetes mellitus with hyperglycemia: Secondary | ICD-10-CM | POA: Diagnosis not present

## 2019-11-13 ENCOUNTER — Ambulatory Visit (INDEPENDENT_AMBULATORY_CARE_PROVIDER_SITE_OTHER): Payer: Medicaid Other | Admitting: Internal Medicine

## 2019-11-13 ENCOUNTER — Encounter: Payer: Self-pay | Admitting: Internal Medicine

## 2019-11-13 ENCOUNTER — Other Ambulatory Visit: Payer: Self-pay

## 2019-11-13 VITALS — BP 116/78 | HR 65 | Temp 97.9°F | Resp 16 | Ht 66.0 in | Wt 163.0 lb

## 2019-11-13 DIAGNOSIS — K219 Gastro-esophageal reflux disease without esophagitis: Secondary | ICD-10-CM

## 2019-11-13 DIAGNOSIS — I639 Cerebral infarction, unspecified: Secondary | ICD-10-CM

## 2019-11-13 DIAGNOSIS — E1165 Type 2 diabetes mellitus with hyperglycemia: Secondary | ICD-10-CM | POA: Diagnosis not present

## 2019-11-13 DIAGNOSIS — E1159 Type 2 diabetes mellitus with other circulatory complications: Secondary | ICD-10-CM

## 2019-11-13 DIAGNOSIS — N1831 Chronic kidney disease, stage 3a: Secondary | ICD-10-CM

## 2019-11-13 DIAGNOSIS — I10 Essential (primary) hypertension: Secondary | ICD-10-CM

## 2019-11-13 DIAGNOSIS — E782 Mixed hyperlipidemia: Secondary | ICD-10-CM | POA: Diagnosis not present

## 2019-11-13 DIAGNOSIS — I69354 Hemiplegia and hemiparesis following cerebral infarction affecting left non-dominant side: Secondary | ICD-10-CM | POA: Diagnosis not present

## 2019-11-13 DIAGNOSIS — I480 Paroxysmal atrial fibrillation: Secondary | ICD-10-CM | POA: Diagnosis not present

## 2019-11-13 DIAGNOSIS — Z794 Long term (current) use of insulin: Secondary | ICD-10-CM | POA: Diagnosis not present

## 2019-11-13 MED ORDER — PANTOPRAZOLE SODIUM 40 MG PO TBEC: DELAYED_RELEASE_TABLET | ORAL | 3 refills | Status: DC

## 2019-11-13 NOTE — Patient Instructions (Signed)
Referrals to GI and endocrine provided. Please keep appointments when made.

## 2019-11-14 ENCOUNTER — Ambulatory Visit: Payer: Medicaid Other | Admitting: Occupational Therapy

## 2019-11-14 ENCOUNTER — Encounter: Payer: Self-pay | Admitting: Occupational Therapy

## 2019-11-14 ENCOUNTER — Ambulatory Visit: Payer: Medicaid Other

## 2019-11-14 DIAGNOSIS — M6281 Muscle weakness (generalized): Secondary | ICD-10-CM

## 2019-11-14 DIAGNOSIS — I6381 Other cerebral infarction due to occlusion or stenosis of small artery: Secondary | ICD-10-CM | POA: Diagnosis not present

## 2019-11-14 DIAGNOSIS — R2681 Unsteadiness on feet: Secondary | ICD-10-CM | POA: Diagnosis not present

## 2019-11-14 DIAGNOSIS — R262 Difficulty in walking, not elsewhere classified: Secondary | ICD-10-CM | POA: Diagnosis not present

## 2019-11-14 DIAGNOSIS — R471 Dysarthria and anarthria: Secondary | ICD-10-CM | POA: Diagnosis not present

## 2019-11-14 DIAGNOSIS — R278 Other lack of coordination: Secondary | ICD-10-CM

## 2019-11-14 DIAGNOSIS — R2689 Other abnormalities of gait and mobility: Secondary | ICD-10-CM | POA: Diagnosis not present

## 2019-11-14 DIAGNOSIS — I69354 Hemiplegia and hemiparesis following cerebral infarction affecting left non-dominant side: Secondary | ICD-10-CM | POA: Diagnosis not present

## 2019-11-14 DIAGNOSIS — E1165 Type 2 diabetes mellitus with hyperglycemia: Secondary | ICD-10-CM | POA: Diagnosis not present

## 2019-11-14 DIAGNOSIS — M79602 Pain in left arm: Secondary | ICD-10-CM | POA: Diagnosis not present

## 2019-11-14 DIAGNOSIS — R490 Dysphonia: Secondary | ICD-10-CM | POA: Diagnosis not present

## 2019-11-14 LAB — BASIC METABOLIC PANEL WITH GFR
BUN/Creatinine Ratio: 24 (calc) — ABNORMAL HIGH (ref 6–22)
BUN: 29 mg/dL — ABNORMAL HIGH (ref 7–25)
CO2: 25 mmol/L (ref 20–32)
Calcium: 9.4 mg/dL (ref 8.6–10.3)
Chloride: 107 mmol/L (ref 98–110)
Creat: 1.2 mg/dL (ref 0.70–1.25)
GFR, Est African American: 75 mL/min/{1.73_m2} (ref >=60)
GFR, Est Non African American: 64 mL/min/{1.73_m2} (ref >=60)
Glucose, Bld: 188 mg/dL — ABNORMAL HIGH (ref 65–99)
Potassium: 3.9 mmol/L (ref 3.5–5.3)
Sodium: 140 mmol/L (ref 135–146)

## 2019-11-14 LAB — CBC WITH DIFFERENTIAL/PLATELET
Absolute Monocytes: 377 cells/uL (ref 200–950)
Basophils Absolute: 41 cells/uL (ref 0–200)
Basophils Relative: 0.9 %
Eosinophils Absolute: 138 cells/uL (ref 15–500)
Eosinophils Relative: 3 %
HCT: 34.4 % — ABNORMAL LOW (ref 38.5–50.0)
Hemoglobin: 11.4 g/dL — ABNORMAL LOW (ref 13.2–17.1)
Lymphs Abs: 1881 cells/uL (ref 850–3900)
MCH: 28.9 pg (ref 27.0–33.0)
MCHC: 33.1 g/dL (ref 32.0–36.0)
MCV: 87.1 fL (ref 80.0–100.0)
MPV: 10.5 fL (ref 7.5–12.5)
Monocytes Relative: 8.2 %
Neutro Abs: 2162 cells/uL (ref 1500–7800)
Neutrophils Relative %: 47 %
Platelets: 269 10*3/uL (ref 140–400)
RBC: 3.95 10*6/uL — ABNORMAL LOW (ref 4.20–5.80)
RDW: 14.6 % (ref 11.0–15.0)
Total Lymphocyte: 40.9 %
WBC: 4.6 10*3/uL (ref 3.8–10.8)

## 2019-11-14 LAB — HEMOGLOBIN A1C
Hgb A1c MFr Bld: 10.8 % of total Hgb — ABNORMAL HIGH (ref <5.7)
Mean Plasma Glucose: 263 (calc)
eAG (mmol/L): 14.6 (calc)

## 2019-11-14 NOTE — Therapy (Signed)
[place] [facility] MAIN [facility] SERVICES [address] [place], [place], [address] Phone: [phone]   Fax:  [phone]  Occupational Therapy Treatment  Patient Details  Name: Evan BARBEAU Sr. MRN: [id number] Date of Birth: May 11, 1956 Referring Provider (OT): Hope Pigeon   Encounter Date: 11/14/2019  OT End of Session - 11/14/19 0944    Visit Number  8    Number of Visits  24    Date for OT Re-Evaluation  12/20/19    Authorization Type  Medicaid    OT Start Time  0930    OT Stop Time  1015    OT Time Calculation (min)  45 min    Activity Tolerance  Patient tolerated treatment well    Behavior During Therapy  [facility] for tasks assessed/performed       Past Medical History:  Diagnosis Date  . Carotid arterial disease (HCC)    a. 08/2018 Carotid U/S: min-mod RICA atherosclerosis w/o hemodynamically significant stenosis. Nl LICA.  . Diabetes 1.5, managed as type 2 (HCC)   . Diastolic dysfunction    a. 08/2018 Echo: EF 65%. No rwma. Gr1 DD. Mild MR.  Marland Kitchen Hypercholesterolemia   . Hypertension   . Left hemiparesis (HCC)    a. Ambulated w/ Cane. Limited use of LUE.  . Pain in both feet   . Poorly controlled diabetes mellitus (HCC)    a. 04/2019 A1c 13.8.  Marland Kitchen Recurrent strokes (HCC)    a. 10/2016 MRI/A: Acute 34mm R thalamic infarct, ? subacute infarct of R corona radiata; b. 08/2017 MRI/A: Acute 85mm lateral L thalamic infarct. Other more remote lacunar infarcts of thalami bilat. Small vessel dzs; c. 08/2018 MRI/A: Acute lacunar infarct of the post limb of R internal capsule. Underlying advanced chrnoic small vessel dzs w/o hemodynamically significant stenosis.  . Tobacco abuse     Past Surgical History:  Procedure Laterality Date  . ESOPHAGOGASTRODUODENOSCOPY (EGD) WITH PROPOFOL N/A 04/26/2019   Procedure: ESOPHAGOGASTRODUODENOSCOPY (EGD) WITH PROPOFOL;  Surgeon: Toney Reil, MD;  Location: [facility] ENDOSCOPY;  Service: Gastroenterology;  Laterality:  N/A;    There were no vitals filed for this visit.  Subjective Assessment - 11/14/19 1714    Subjective   Pt. reports left elbow tightness    Patient is accompanied by:  Family member    Pertinent History  Pt. is a 64 y.o. male with a history of a CVA with left hemiplegia one year ago was admitted to [facility] on 09/04/2019 with Atypical Pneumonia, Diabetic Ketoacidosis without coma. Pt. was then admitted to [facility] received inpatient rehabilitation services. Pt. resides with his wife who assists with all ADLs, and IADLs. Pt. was previously indepedent with ADL tasks, worked in Museum/gallery curator, and enjoys fishing.    Currently in Pain?  No/denies       OT TREATMENT    Neuro muscular re-education:  Pt. Tolerated weightbearing, and proprioceptive input through the LUE, and hand to inhibit tone, and prepare the UE for ROM, and facilitation of active movement. Pt. workedonweightbearing with a flat open hand with slow forward rocking, and preparing the hand for weightbearing position when seated. Pt. worked on alternating weightbearing with functional reaching.Pt. worked on reaching with the left hand for cones, and leaning forward with the trunk to place them onto a tabletop surface. Pt. worked on extending his digits to release the cones. Pt. presented with more active digit extension consistently throughout the session today.  Therapeutic Exercise:  Pt. tolerated PROM/AAROM/AROM in all joint  ranges of the LUE, and hand with slow gentle stretching is tone reducing patterns.   Manual Therapy:  Pt. Tolerated scapular mobilization in elevation, depression, and rotation while in sidelying to prepare the LUE for ROM. Pt. Tolerated soft tissue mobilizations of radius on ulna for supination secondary to tone, and tightness in pronators. Pt. Tolerated soft tissue mobilizations metacarpal spread stretches to the left hand prior to ROM. Manual techniques were performed prior to, and independent of  ROM/ther. Ex.  Pt. reports that the hand splint is working well for him at night. Pt. continues to present with flexor tone, tightness, and spasticity in his left UE, and hand. Pt. Responded well to the inhibitory techniques, and moist heat to the elbow prior to ROM. Pt. was able to initiate active digit extension more consistently with facilitation, and when releasing his hand off of objects. Pt. continues to work on improving, and normaling tone in the LUE, and facilitating increased active volitional movement in order to increase LUE engagement during ADLs, and IADL tasks.                       OT Education - 11/14/19 0944    Education Details  LUEROM    Person(s) Educated  Patient    Methods  Explanation    Comprehension  Verbalized understanding;Returned demonstration          OT Long Term Goals - 09/27/19 1711      OT LONG TERM GOAL #1   Title  Pt. will be independent with donning his shirt using one armed dressing techniques    Baseline  Eval: Pt. requires assist form caregiver    Time  12    Period  Weeks    Status  New    Target Date  12/20/19      OT LONG TERM GOAL #2   Title  Pt. will donn LE dressing with modified independence    Baseline  Eval: MaxA    Time  12    Period  Weeks    Status  New    Target Date  12/20/19      OT LONG TERM GOAL #3   Title  Pt. will improve left digit flexion to be able to hold a toothbrush while applying toothpaste.    Baseline  Eval: Pt. is unable    Time  12    Period  Weeks    Status  New    Target Date  12/20/19      OT LONG TERM GOAL #4   Title  Pt. will improve left shoulder flexion,and abduction AROM by 20 degrees to be able to reach his head in preparation for brushing his hair.    Baseline  Eval: Pt. is unable    Time  12    Period  Weeks    Status  New    Target Date  12/20/19      OT LONG TERM GOAL #5   Title  Pt. will be independent with HEP for the LUE, and hand    Baseline  Eval: Pt.  does not have a HEP    Time  12    Period  Weeks    Status  New    Target Date  12/20/19            Plan - 11/14/19 0945    Clinical Impression Statement Pt. Reports that the hand splint is working well for him at night. Pt. continues  to present with flexor tone, tightness, and spasticity in his left UE, and hand. Pt. Responded well to the inhibitory techniques, and moist heat to the elbow prior to ROM. Pt. was able to initiate active digit extension more consistently with facilitation, and when releasing his hand off of objects. Pt. continues to work on improving, and normaling tone in the LUE, and facilitating increased active volitional movement in order to increase LUE engagement during ADLs, and IADL tasks.    OT Occupational Profile and History  Detailed Assessment- Review of Records and additional review of physical, cognitive, psychosocial history related to current functional performance    Occupational performance deficits (Please refer to evaluation for details):  ADL's;IADL's    Body Structure / Function / Physical Skills  ADL;GMC;Strength;IADL;FMC    Rehab Potential  Good    Clinical Decision Making  Multiple treatment options, significant modification of task necessary    Modification or Assistance to Complete Evaluation   Max significant modification of tasks or assist is necessary to complete    OT Frequency  2x / week    OT Duration  12 weeks    OT Treatment/Interventions  Self-care/ADL training;Therapeutic exercise;Patient/family education;DME and/or AE instruction;Neuromuscular education;Manual Therapy;Therapeutic activities;Splinting;Moist Heat;Electrical Stimulation    Family Member Consulted  wife       Patient will benefit from skilled therapeutic intervention in order to improve the following deficits and impairments:   Body Structure / Function / Physical Skills: ADL, GMC, Strength, IADL, [place]       Visit Diagnosis: Muscle weakness (generalized)  Other  lack of coordination    Problem List Patient Active Problem List   Diagnosis Date Noted  . Paroxysmal atrial fibrillation ([place]) 10/26/2019  . Chest pain of uncertain etiology 36/64/4034  . History of CVA (cerebrovascular accident) 09/05/2019  . Hyperglycemia 09/05/2019  . Anemia 09/05/2019  . AKI (acute kidney injury) ([place]) 09/05/2019  . Atypical pneumonia 09/05/2019  . Coagulation disorder ([place]) 06/12/2019  . Gastroesophageal reflux disease   . 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 RBBB 10/15/2018  . Sinus bradycardia 10/15/2018  . Dysphagia, post-stroke   . Neuropathic pain   . Lacunar infarct, acute ([place]) 09/06/2018  . TIA (transient ischemic attack) 09/01/2018  . Microalbuminuria 07/29/2018  . Left leg pain 01/13/2018  . Hyperlipidemia LDL goal <70 02/11/2017  . Recurrent strokes ([place]) 11/10/2016  . Essential hypertension 11/10/2016  . Diabetes ([place]) 11/10/2016  . Tobacco abuse 08/28/2013    Harrel Carina, MS, OTR/L 11/14/2019, 5:15 PM  [place] MAIN [facility] SERVICES [address] [place], [place], [address] Phone: [phone]   Fax:  [phone]  Name: Evan LEKAS Sr. MRN: [id number] Date of Birth: 1956-03-29

## 2019-11-14 NOTE — Therapy (Signed)
[place] MAIN [facility] SERVICES [address] [place], [place], [address] Phone: [phone]   Fax:  [phone]  Physical Therapy Treatment  Patient Details  Name: Evan HELMERS Sr. MRN: [id number] Date of Birth: [birth year] Referring Provider (PT): Sharlene Dory   Encounter Date: 11/14/2019  PT End of Session - 11/14/19 0855    Visit Number  7    Number of Visits  8    Date for PT Re-Evaluation  11/22/19    Authorization Time Period  authorized 9 visits 10/04/19- 12/05/19    Authorization - Visit Number  7    Authorization - Number of Visits  9    PT Start Time  0846    PT Stop Time  0930    PT Time Calculation (min)  44 min    Equipment Utilized During Treatment  Gait belt;Other (comment)   L swedish knee brace, AFO   Activity Tolerance  Patient tolerated treatment well    Behavior During Therapy  [facility] for tasks assessed/performed       Past Medical History:  Diagnosis Date  . Carotid arterial disease ([place])    a. 08/2018 Carotid U/S: min-mod RICA atherosclerosis w/o hemodynamically significant stenosis. Nl LICA.  . Diabetes 1.5, managed as type 2 ([place])   . Diastolic dysfunction    a. 08/2018 Echo: EF 65%. No rwma. Gr1 DD. Mild MR.  Marland Kitchen Hypercholesterolemia   . Hypertension   . Left hemiparesis ([employer])    a. Ambulated w/ Cane. Limited use of LUE.  . Pain in both feet   . Poorly controlled diabetes mellitus ([place])    a. 04/2019 A1c 13.8.  Marland Kitchen Recurrent strokes ([place])    a. 10/2016 MRI/A: Acute 25mm R thalamic infarct, ? subacute infarct of R corona radiata; b. 08/2017 MRI/A: Acute 88mm lateral L thalamic infarct. Other more remote lacunar infarcts of thalami bilat. Small vessel dzs; c. 08/2018 MRI/A: Acute lacunar infarct of the post limb of R internal capsule. Underlying advanced chrnoic small vessel dzs w/o hemodynamically significant stenosis.  . Tobacco abuse     Past Surgical History:  Procedure Laterality Date  . ESOPHAGOGASTRODUODENOSCOPY  (EGD) WITH PROPOFOL N/A 04/26/2019   Procedure: ESOPHAGOGASTRODUODENOSCOPY (EGD) WITH PROPOFOL;  Surgeon: Lin Landsman, MD;  Location: [place];  Service: Gastroenterology;  Laterality: N/A;    There were no vitals filed for this visit.  Subjective Assessment - 11/14/19 0853    Subjective  Patient reports no falls or LOB since last session. Hasn't gotten any word about his Lofstrand crutch.    Pertinent History  Patient is a pleasant 64 year old male returning to PT (evaluation on 09/04/19) with PMH of HTN, HLD, Type II DM, right basal ganglia stroke (08/2018), acute CVA, sinus bradycardia, RBBB, chronic knee pain, and peripheral neuropathy. Since patient has been seen last he has been admitted to the hospital three times and went to [facility] acute inpatient rehab for left paramedian pontine and right cerebellar ischemic stroke (09/07/19) discharged 09/23/19.  Per CIR documentation patient utilizes AFO and Swedish knee cage on LLE to prevent genu recurvatum. Patient was initially evaluated prior to recent hospitalization for L hemiplegia, went to inpatient rehab in 2020 for first CVA. Has an aide at home    Limitations  Lifting;Standing;Walking;House hold activities    How long can you sit comfortably?  n/a    How long can you stand comfortably?  needs to hold onto objects    How long can you  walk comfortably?  needs AD and CGA    Patient Stated Goals  to get back to fishing, walking better, balance.    Currently in Pain?  No/denies            Treatment:  Nustep Lvl 0 RPM> 40 LE ; very challenging LLE  Ambulate with unilateral Lofstrand crutch in RUE. Negotiate 27ft with CGA, cueing for weight shift. Decreased episodes of L knee hyperextension. x4  trials.   Superset between laps around gym: First lap: 4lb ankle weight: -marching 10x each LE -LAQ 10x each LE   Second lap RTB abduction 15x; one LE at a time, very challenging LLE.  RTB adduction single LE at a time 12x each  LE  Third lap: RTB hamstring curl 15x each LE Df/pf 15x LLE      Object negotiation: weave between 6 cones, cues for proper sequencing with Lofstrand for stability x 4 trials.      Sit to stand from plinth table 5lb bar in elbows for front squat demand. 12x. Very challenging controlling eccentric portion however improved with repetition.   airex pad: static stand 30 seconds  airex pad one foot on 6" step one foot on airex pad 30 second hold each foot position.     Vitals monitored throughout session to ensure HR and SPo2 within functional range with interventions                    PT Education - 11/14/19 0854    Education Details  exercise technique, body mechanics, nearing end of approved visits    Person(s) Educated  Patient    Methods  Explanation;Demonstration;Tactile cues;Verbal cues    Comprehension  Verbalized understanding;Returned demonstration;Verbal cues required;Tactile cues required       PT Short Term Goals - 09/27/19 1653      PT SHORT TERM GOAL #1   Title  Patient will be independent with HEP for improved therapeutic gains and self-management.    Baseline  2/10: give next session    Time  4    Period  Weeks    Status  New    Target Date  10/25/19        PT Long Term Goals - 09/27/19 1653      PT LONG TERM GOAL #1   Title  Patient will increase Berg Balance score by > 6 points (30/56) to demonstrate decreased fall risk during functional activities.    Baseline  2/10: 24/56    Time  8    Period  Weeks    Status  New    Target Date  11/22/19      PT LONG TERM GOAL #2   Title  Patient will increase 10 meter walk test to >1.7m/s as to improve gait speed for better community ambulation and to reduce fall risk.    Baseline  2/10: 0.31 m/s with hurrycane and w/c follow    Time  8    Period  Weeks    Status  New    Target Date  11/22/19      PT LONG TERM GOAL #3   Title  Patient (> 22 years old) will complete five times sit to stand  test in < 15 seconds with no UE support indicating an increased LE strength and improved balance.    Baseline  2/10: 17 seconds heavy RUE support and use only of RLE    Time  8    Period  Weeks  Status  New    Target Date  11/22/19      PT LONG TERM GOAL #4   Title  Patient will increase BLE gross strength to 4+/5 as to improve functional strength for independent gait, increased standing tolerance and increased ADL ability.    Baseline  2/10: see evaluation note    Time  8    Period  Weeks    Status  New    Target Date  11/22/19            Plan - 11/14/19 1255    Clinical Impression Statement  Patient notified of nearing end of physical therapy approval of insurance. Focus on superset of ambulation with strengthening interventions was challenging to patient but tolerated. Patient demonstrated functional gait mechanics even with fatigue. Patient will continue to benefit from skilled PT to improve mobility and safety.    Personal Factors and Comorbidities  Age;Education;Sex;Social Background;Behavior Pattern;Finances;Past/Current Experience;Time since onset of injury/illness/exacerbation;Comorbidity 3+;Transportation    Comorbidities  PMH of HTN, HLD, Type II DM, right basal ganglia stroke (08/2018), acute CVA, sinus bradycardia, RBBB, chronic knee pain, and peripheral neuropathy    Examination-Activity Limitations  Bathing;Bed Mobility;Dressing;Transfers;Squat;Lift;Locomotion Level;Stairs;Reach Overhead;Carry;Stand;Sit    Examination-Participation Restrictions  Interpersonal Relationship;Yard Work;Laundry;Cleaning;Community Activity;Meal Prep;Medication Management;Driving;Shop;Volunteer    Stability/Clinical Decision Making  Unstable/Unpredictable    Rehab Potential  Fair    PT Frequency  1x / week    PT Duration  8 weeks    PT Treatment/Interventions  ADLs/Self Care Home Management;Aquatic Therapy;Moist Heat;Cryotherapy;Gait training;Stair training;Functional mobility  training;Therapeutic activities;Therapeutic exercise;Patient/family education;Neuromuscular re-education;Balance training;Orthotic Fit/Training;Manual techniques;Passive range of motion;Dry needling;Taping;Energy conservation;Splinting;Joint Manipulations;Spinal Manipulations;Electrical Stimulation;Ultrasound;Vestibular;Biofeedback;Iontophoresis 4mg /ml Dexamethasone;DME Instruction;Visual/perceptual remediation/compensation    PT Next Visit Plan  HEP, // bars, safety awareness    PT Home Exercise Plan  give next one    Consulted and Agree with Plan of Care  Patient;Family member/caregiver       Patient will benefit from skilled therapeutic intervention in order to improve the following deficits and impairments:  Abnormal gait, Decreased balance, Decreased endurance, Decreased mobility, Difficulty walking, Hypomobility, Increased muscle spasms, Impaired tone, Decreased range of motion, Decreased coordination, Decreased strength, Impaired UE functional use, Pain, Decreased activity tolerance, Decreased safety awareness, Impaired perceived functional ability, Impaired sensation, Improper body mechanics, Postural dysfunction, Decreased knowledge of use of DME, Decreased knowledge of precautions, Impaired flexibility, Impaired vision/preception  Visit Diagnosis: Muscle weakness (generalized)  Other lack of coordination  Hemiplegia and hemiparesis following cerebral infarction affecting left non-dominant side (HCC)  Unsteadiness on feet     Problem List Patient Active Problem List   Diagnosis Date Noted  . Paroxysmal atrial fibrillation (HCC) 10/26/2019  . Chest pain of uncertain etiology 09/05/2019  . History of CVA (cerebrovascular accident) 09/05/2019  . Hyperglycemia 09/05/2019  . Anemia 09/05/2019  . AKI (acute kidney injury) (HCC) 09/05/2019  . Atypical pneumonia 09/05/2019  . Coagulation disorder (HCC) 06/12/2019  . Gastroesophageal reflux disease   . Hemiplegia and hemiparesis  following cerebral infarction affecting left non-dominant side (HCC) 11/25/2018  . 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 RBBB 10/15/2018  . Sinus bradycardia 10/15/2018  . Dysphagia, post-stroke   . Neuropathic pain   . Lacunar infarct, acute (HCC) 09/06/2018  . TIA (transient ischemic attack) 09/01/2018  . Microalbuminuria 07/29/2018  . Left leg pain 01/13/2018  . Hyperlipidemia LDL goal <70 02/11/2017  . Recurrent strokes (HCC) 11/10/2016  . Essential hypertension 11/10/2016  . Diabetes (HCC) 11/10/2016  . Tobacco abuse 08/28/2013   10/26/2013, PT, DPT  11/14/2019, 12:56 PM  [place] [facility] MAIN [facility] SERVICES [address] [place], [place], [address] Phone: [phone]   Fax:  [phone]  Name: Evan HENGEL Sr. MRN: [id number] Date of Birth: 1956-05-07

## 2019-11-15 DIAGNOSIS — E1165 Type 2 diabetes mellitus with hyperglycemia: Secondary | ICD-10-CM | POA: Diagnosis not present

## 2019-11-16 DIAGNOSIS — E1165 Type 2 diabetes mellitus with hyperglycemia: Secondary | ICD-10-CM | POA: Diagnosis not present

## 2019-11-17 ENCOUNTER — Ambulatory Visit: Payer: Medicaid Other | Attending: *Deleted | Admitting: Speech Pathology

## 2019-11-17 DIAGNOSIS — E1165 Type 2 diabetes mellitus with hyperglycemia: Secondary | ICD-10-CM | POA: Diagnosis not present

## 2019-11-17 DIAGNOSIS — R262 Difficulty in walking, not elsewhere classified: Secondary | ICD-10-CM | POA: Insufficient documentation

## 2019-11-17 DIAGNOSIS — I6381 Other cerebral infarction due to occlusion or stenosis of small artery: Secondary | ICD-10-CM | POA: Insufficient documentation

## 2019-11-17 DIAGNOSIS — R278 Other lack of coordination: Secondary | ICD-10-CM | POA: Insufficient documentation

## 2019-11-17 DIAGNOSIS — R2689 Other abnormalities of gait and mobility: Secondary | ICD-10-CM | POA: Insufficient documentation

## 2019-11-17 DIAGNOSIS — I69354 Hemiplegia and hemiparesis following cerebral infarction affecting left non-dominant side: Secondary | ICD-10-CM | POA: Insufficient documentation

## 2019-11-17 DIAGNOSIS — R490 Dysphonia: Secondary | ICD-10-CM | POA: Insufficient documentation

## 2019-11-17 DIAGNOSIS — M6281 Muscle weakness (generalized): Secondary | ICD-10-CM | POA: Insufficient documentation

## 2019-11-17 DIAGNOSIS — R2681 Unsteadiness on feet: Secondary | ICD-10-CM | POA: Insufficient documentation

## 2019-11-17 DIAGNOSIS — R471 Dysarthria and anarthria: Secondary | ICD-10-CM | POA: Insufficient documentation

## 2019-11-17 DIAGNOSIS — M79602 Pain in left arm: Secondary | ICD-10-CM | POA: Insufficient documentation

## 2019-11-18 DIAGNOSIS — E1165 Type 2 diabetes mellitus with hyperglycemia: Secondary | ICD-10-CM | POA: Diagnosis not present

## 2019-11-19 DIAGNOSIS — E1165 Type 2 diabetes mellitus with hyperglycemia: Secondary | ICD-10-CM | POA: Diagnosis not present

## 2019-11-20 ENCOUNTER — Encounter: Payer: Medicaid Other | Admitting: Occupational Therapy

## 2019-11-20 ENCOUNTER — Ambulatory Visit: Payer: Medicaid Other

## 2019-11-20 DIAGNOSIS — E1165 Type 2 diabetes mellitus with hyperglycemia: Secondary | ICD-10-CM | POA: Diagnosis not present

## 2019-11-21 ENCOUNTER — Encounter: Payer: Medicaid Other | Admitting: Occupational Therapy

## 2019-11-21 ENCOUNTER — Ambulatory Visit: Payer: Medicaid Other

## 2019-11-21 DIAGNOSIS — E1165 Type 2 diabetes mellitus with hyperglycemia: Secondary | ICD-10-CM | POA: Diagnosis not present

## 2019-11-22 ENCOUNTER — Encounter: Payer: Self-pay | Admitting: Speech Pathology

## 2019-11-22 ENCOUNTER — Other Ambulatory Visit: Payer: Self-pay

## 2019-11-22 ENCOUNTER — Ambulatory Visit: Payer: Medicaid Other | Admitting: Occupational Therapy

## 2019-11-22 ENCOUNTER — Ambulatory Visit: Payer: Medicaid Other

## 2019-11-22 ENCOUNTER — Ambulatory Visit: Payer: Medicaid Other | Admitting: Speech Pathology

## 2019-11-22 DIAGNOSIS — I69354 Hemiplegia and hemiparesis following cerebral infarction affecting left non-dominant side: Secondary | ICD-10-CM | POA: Diagnosis not present

## 2019-11-22 DIAGNOSIS — R2689 Other abnormalities of gait and mobility: Secondary | ICD-10-CM | POA: Diagnosis not present

## 2019-11-22 DIAGNOSIS — R471 Dysarthria and anarthria: Secondary | ICD-10-CM | POA: Diagnosis not present

## 2019-11-22 DIAGNOSIS — R278 Other lack of coordination: Secondary | ICD-10-CM | POA: Diagnosis not present

## 2019-11-22 DIAGNOSIS — M6281 Muscle weakness (generalized): Secondary | ICD-10-CM

## 2019-11-22 DIAGNOSIS — R2681 Unsteadiness on feet: Secondary | ICD-10-CM

## 2019-11-22 DIAGNOSIS — E1165 Type 2 diabetes mellitus with hyperglycemia: Secondary | ICD-10-CM | POA: Diagnosis not present

## 2019-11-22 DIAGNOSIS — M79602 Pain in left arm: Secondary | ICD-10-CM | POA: Diagnosis not present

## 2019-11-22 DIAGNOSIS — R490 Dysphonia: Secondary | ICD-10-CM | POA: Diagnosis not present

## 2019-11-22 DIAGNOSIS — I6381 Other cerebral infarction due to occlusion or stenosis of small artery: Secondary | ICD-10-CM | POA: Diagnosis not present

## 2019-11-22 DIAGNOSIS — R262 Difficulty in walking, not elsewhere classified: Secondary | ICD-10-CM | POA: Diagnosis not present

## 2019-11-22 NOTE — Therapy (Signed)
[place] MAIN [facility] SERVICES [address] [place], [place], [address] Phone: [phone]   Fax:  [phone]  Physical Therapy Treatment/RECERT  Patient Details  Name: Evan TARAS Sr. MRN: [id number] Date of Birth: November 18, 1955 Referring Provider (PT): Sharlene Dory   Encounter Date: 11/22/2019  PT End of Session - 11/22/19 1739    Visit Number  8    Number of Visits  16    Date for PT Re-Evaluation  01/17/20    Authorization Time Period  authorized 9 visits 10/04/19- 12/05/19    Authorization - Visit Number  8    Authorization - Number of Visits  9    PT Start Time  2505    PT Stop Time  1729    PT Time Calculation (min)  43 min    Equipment Utilized During Treatment  Gait belt;Other (comment)   L swedish knee brace, AFO   Activity Tolerance  Patient tolerated treatment well    Behavior During Therapy  [facility] for tasks assessed/performed       Past Medical History:  Diagnosis Date  . Carotid arterial disease ([place])    a. 08/2018 Carotid U/S: min-mod RICA atherosclerosis w/o hemodynamically significant stenosis. Nl LICA.  . Diabetes 1.5, managed as type 2 ([place])   . Diastolic dysfunction    a. 08/2018 Echo: EF 65%. No rwma. Gr1 DD. Mild MR.  Marland Kitchen Hypercholesterolemia   . Hypertension   . Left hemiparesis ([place])    a. Ambulated w/ Cane. Limited use of LUE.  . Pain in both feet   . Poorly controlled diabetes mellitus ([place])    a. 04/2019 A1c 13.8.  Marland Kitchen Recurrent strokes ([place])    a. 10/2016 MRI/A: Acute 56m R thalamic infarct, ? subacute infarct of R corona radiata; b. 08/2017 MRI/A: Acute 852mlateral L thalamic infarct. Other more remote lacunar infarcts of thalami bilat. Small vessel dzs; c. 08/2018 MRI/A: Acute lacunar infarct of the post limb of R internal capsule. Underlying advanced chrnoic small vessel dzs w/o hemodynamically significant stenosis.  . Tobacco abuse     Past Surgical History:  Procedure Laterality Date  .  ESOPHAGOGASTRODUODENOSCOPY (EGD) WITH PROPOFOL N/A 04/26/2019   Procedure: ESOPHAGOGASTRODUODENOSCOPY (EGD) WITH PROPOFOL;  Surgeon: VaLin LandsmanMD;  [place];  Laterality: N/A;    There were no vitals filed for this visit.  Subjective Assessment - 11/22/19 1738    Subjective  Patient is excited to receive referral form for Lofstrand crutch. Compliant with HEP, no falls or LOB    Pertinent History  Patient is a pleasant [age]ear old male returning to PT (evaluation on 09/04/19) with PMH of HTN, HLD, Type II DM, right basal ganglia stroke (08/2018), acute CVA, sinus bradycardia, RBBB, chronic knee pain, and peripheral neuropathy. 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  Per CIR documentation patient utilizes AFO and Swedish knee cage on LLE to prevent genu recurvatum. Patient was initially evaluated prior to recent hospitalization for L hemiplegia, went to inpatient rehab in 2020 for first CVA. Has an aide at home    Limitations  Lifting;Standing;Walking;House hold activities    How long can you sit comfortably?  n/a    How long can you stand comfortably?  needs to hold onto objects    How long can you  walk comfortably?  needs AD and CGA    Patient Stated Goals  to get back to fishing, walking better, balance.    Currently in Pain?  No/denies         [facility] PT Assessment - 11/22/19 0001      Standardized Balance Assessment   Standardized Balance Assessment  Berg Balance Test      Berg Balance Test   Sit to Stand  Able to stand without using hands and stabilize independently    Standing Unsupported  Able to stand 2 minutes with supervision    Sitting with Back Unsupported but Feet Supported on Floor or Stool  Able to sit safely and securely 2 minutes    Stand to Sit   Controls descent by using hands    Transfers  Able to transfer safely, minor use of hands    Standing Unsupported with Eyes Closed  Able to stand 10 seconds with supervision    Standing Unsupported with Feet Together  Able to place feet together independently and stand for 1 minute with supervision    From Standing, Reach Forward with Outstretched Arm  Can reach forward >12 cm safely (5")    From Standing Position, Pick up Object from Floor  Able to pick up shoe, needs supervision    From Standing Position, Turn to Look Behind Over each Shoulder  Looks behind one side only/other side shows less weight shift    Turn 360 Degrees  Able to turn 360 degrees safely but slowly    Standing Unsupported, Alternately Place Feet on Step/Stool  Able to complete >2 steps/needs minimal assist    Standing Unsupported, One Foot in Front  Able to take small step independently and hold 30 seconds    Standing on One Leg  Able to lift leg independently and hold equal to or more than 3 seconds    Total Score  40       RECERT- one more approved by insurance Goals  BERG: 40/56  10 MWT: 24. 7 seconds, 23.8 seconds second trial =0.25ms with crutch; Ambulate with unilateral Lofstrand crutch in RUE.  5x STS 17 seconds no UE support  FOTO: 57.7%    Nustep lvl 5 LE only ; cues for keeping LLE in neutral position  Leg press #55 lb, 15x, with Min A for LLE guarding for reduction of hyperextension and to maintain neutral alignment , 2 sets   Patient demonstrates excellent progression towards goals. He is faster with ambulating and is able to transfer without UE support now. His BERG has significantly improved to 40/56 and his FOTO to 57.7%. Will recert patient for additional time however per insurance; they will only pay for one more visit. Upon reaching visit limit [facility] Patient will continue to benefit from skilled PT to improve mobility and safety.             PT  Education - 11/22/19 1739    Education Details  exercise technique, body mechanics, goals, second to last approved visit    Person(s) Educated  Patient    Methods  Explanation;Demonstration;Tactile cues;Verbal cues    Comprehension  Verbalized understanding;Returned demonstration;Verbal cues required;Tactile cues required       PT Short Term Goals - 11/22/19 1718      PT SHORT TERM GOAL #1   Title  Patient will be independent with HEP for improved therapeutic gains and self-management.    Baseline  2/10: give next session 4/7: HEP  compliant    Time  4    Period  Weeks    Status  Partially Met    Target Date  10/25/19        PT Long Term Goals - 11/22/19 1714      PT LONG TERM GOAL #1   Title  Patient will increase Berg Balance score by > 6 points (30/56) to demonstrate decreased fall risk during functional activities.    Baseline  2/10: 24/56 4/7: 40/56    Time  8    Period  Weeks    Status  Achieved      PT LONG TERM GOAL #2   Title  Patient will increase 10 meter walk test to >1.10ms as to improve gait speed for better community ambulation and to reduce fall risk.    Baseline  2/10: 0.31 m/s with hurrycane and w/c follow 4/7: 0.43 m/s with Lofstrand crutch    Time  8    Period  Weeks    Status  Partially Met    Target Date  01/17/20      PT LONG TERM GOAL #3   Title  Patient (> 673years old) will complete five times sit to stand test in < 15 seconds with no UE support indicating an increased LE strength and improved balance.    Baseline  2/10: 17 seconds heavy RUE support and use only of RLE 4/7: 17 seconds no UE support    Time  8    Period  Weeks    Status  Partially Met    Target Date  01/17/20      PT LONG TERM GOAL #4   Title  Patient will increase BLE gross strength to 4+/5 as to improve functional strength for independent gait, increased standing tolerance and increased ADL ability.    Baseline  2/10: see evaluation note 4/7: R 4+/5 L hip flex 4-/5 abd/add  3+/5 knee extend 4/5 knee flex 3+/5 foot 3/5    Time  8    Period  Weeks    Status  Partially Met    Target Date  01/17/20            Plan - 11/22/19 1741    Clinical Impression Statement  Patient demonstrates excellent progression towards goals. He is faster with ambulating and is able to transfer without UE support now. His BERG has significantly improved to 40/56 and his FOTO to 57.7%. Will recert patient for additional time however per insurance; they will only pay for one more visit. Upon reaching visit limit [facility] Patient will continue to benefit from skilled PT to improve mobility and safety.    Personal Factors and Comorbidities  Age;Education;Sex;Social Background;Behavior Pattern;Finances;Past/Current Experience;Time since onset of injury/illness/exacerbation;Comorbidity 3+;Transportation    Comorbidities  PMH of HTN, HLD, Type II DM, right basal ganglia stroke (08/2018), acute CVA, sinus bradycardia, RBBB, chronic knee pain, and peripheral neuropathy    Examination-Activity Limitations  Bathing;Bed Mobility;Dressing;Transfers;Squat;Lift;Locomotion Level;Stairs;Reach Overhead;Carry;Stand;Sit    Examination-Participation Restrictions  Interpersonal Relationship;Yard Work;Laundry;Cleaning;Community Activity;Meal Prep;Medication Management;Driving;Shop;Volunteer    Stability/Clinical Decision Making  Unstable/Unpredictable    Rehab Potential  Fair    PT Frequency  1x / week    PT Duration  8 weeks    PT Treatment/Interventions  ADLs/Self Care Home Management;Aquatic Therapy;Moist Heat;Cryotherapy;Gait training;Stair training;Functional mobility training;Therapeutic activities;Therapeutic exercise;Patient/family education;Neuromuscular re-education;Balance training;Orthotic Fit/Training;Manual techniques;Passive range of motion;Dry needling;Taping;Energy conservation;Splinting;Joint Manipulations;Spinal Manipulations;Electrical  Stimulation;Ultrasound;Vestibular;Biofeedback;Iontophoresis 487mml Dexamethasone;DME Instruction;Visual/perceptual remediation/compensation    PT Next Visit  Plan  last improved by insurance next session    PT Home Exercise Plan  give next one    Consulted and Agree with Plan of Care  Patient;Family member/caregiver       Patient will benefit from skilled therapeutic intervention in order to improve the following deficits and impairments:  Abnormal gait, Decreased balance, Decreased endurance, Decreased mobility, Difficulty walking, Hypomobility, Increased muscle spasms, Impaired tone, Decreased range of motion, Decreased coordination, Decreased strength, Impaired UE functional use, Pain, Decreased activity tolerance, Decreased safety awareness, Impaired perceived functional ability, Impaired sensation, Improper body mechanics, Postural dysfunction, Decreased knowledge of use of DME, Decreased knowledge of precautions, Impaired flexibility, Impaired vision/preception  Visit Diagnosis: Muscle weakness (generalized)  Other lack of coordination  Hemiplegia and hemiparesis following cerebral infarction affecting left non-dominant side (HCC)  Unsteadiness on feet     Problem List Patient Active Problem List   Diagnosis Date Noted  . Paroxysmal atrial fibrillation ([place]) 10/26/2019  . Chest pain of uncertain etiology 16/02/3709  . History of CVA (cerebrovascular accident) 09/05/2019  . Hyperglycemia 09/05/2019  . Anemia 09/05/2019  . AKI (acute kidney injury) ([name]) 09/05/2019  . Atypical pneumonia 09/05/2019  . Coagulation disorder ([place]) 06/12/2019  . Gastroesophageal reflux disease   . 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 RBBB 10/15/2018  . Sinus bradycardia 10/15/2018  . Dysphagia, post-stroke   . Neuropathic pain   . Lacunar infarct, acute ([place]) 09/06/2018  . TIA (transient ischemic  attack) 09/01/2018  . Microalbuminuria 07/29/2018  . Left leg pain 01/13/2018  . Hyperlipidemia LDL goal <70 02/11/2017  . Recurrent strokes ([place]) 11/10/2016  . Essential hypertension 11/10/2016  . Diabetes ([place]) 11/10/2016  . Tobacco abuse 08/28/2013   Janna Arch, PT, DPT   11/22/2019, 5:42 PM  [place] MAIN [facility] SERVICES [address] [place], [place], [address] Phone: [phone]   Fax:  [phone]  Name: DEIONDRE HARROWER Sr. MRN: [id number] Date of Birth: 01/05/1956

## 2019-11-22 NOTE — Therapy (Signed)
[facility] MAIN [facility] SERVICES [address] [place], [place], [address] Phone: [phone]   Fax:  [phone]  Speech Language Pathology Treatment  Patient Details  Name: Evan PIATKOWSKI Sr. MRN: [id number] Date of Birth: 29-Aug-1955 Referring Provider (SLP): Franz Dell   Encounter Date: 11/22/2019  End of Session - 11/22/19 1700    Visit Number  4    Number of Visits  8    Date for SLP Re-Evaluation  12/15/19    Authorization Type  Medicaid    Authorization - Visit Number  4    Authorization - Number of Visits  10    SLP Start Time  1510    SLP Stop Time   1550    SLP Time Calculation (min)  40 min    Activity Tolerance  Patient tolerated treatment well       Past Medical History:  Diagnosis Date  . Carotid arterial disease (HCC)    a. 08/2018 Carotid U/S: min-mod RICA atherosclerosis w/o hemodynamically significant stenosis. Nl LICA.  . Diabetes 1.5, managed as type 2 (HCC)   . Diastolic dysfunction    a. 08/2018 Echo: EF 65%. No rwma. Gr1 DD. Mild MR.  Marland Kitchen Hypercholesterolemia   . Hypertension   . Left hemiparesis (HCC)    a. Ambulated w/ Cane. Limited use of LUE.  . Pain in both feet   . Poorly controlled diabetes mellitus (HCC)    a. 04/2019 A1c 13.8.  Marland Kitchen Recurrent strokes (HCC)    a. 10/2016 MRI/A: Acute 57mm R thalamic infarct, ? subacute infarct of R corona radiata; b. 08/2017 MRI/A: Acute 14mm lateral L thalamic infarct. Other more remote lacunar infarcts of thalami bilat. Small vessel dzs; c. 08/2018 MRI/A: Acute lacunar infarct of the post limb of R internal capsule. Underlying advanced chrnoic small vessel dzs w/o hemodynamically significant stenosis.  . Tobacco abuse     Past Surgical History:  Procedure Laterality Date  . ESOPHAGOGASTRODUODENOSCOPY (EGD) WITH PROPOFOL N/A 04/26/2019   Procedure: ESOPHAGOGASTRODUODENOSCOPY (EGD) WITH PROPOFOL;  Surgeon: Toney Reil, MD;  [facility]: [facility] ENDOSCOPY;  Service:  Gastroenterology;  Laterality: N/A;    There were no vitals filed for this visit.  Subjective Assessment - 11/22/19 1659    Subjective  Patient was engaged throughout the session.            ADULT SLP TREATMENT - 11/22/19 0001      General Information   Behavior/Cognition  Alert;Cooperative;Pleasant mood      Treatment Provided   Treatment provided  Cognitive-Linquistic      Pain Assessment   Pain Assessment  No/denies pain      Cognitive-Linquistic Treatment   Treatment focused on  Voice    Skilled Treatment  Flow Phonation Level 1: establish airflow release unarticulated. Patient with improved  duration and decreased tension.   Flow phonation Level 2: establish airflow release articulated. Patient demonstrated improvement on monosyllabic, disyllabic and trisyllabic words and phrases. Does require cues in order to produce flow phonation.Unable to go from voiceless alveolar fricative to voiced alveolar fricative, but could do a voiceless labiodental fricative to a voiced labiodental fricative with no pause.      Assessment / Recommendations / Plan   Plan  Continue with current plan of care      Progression Toward Goals   Progression toward goals  Progressing toward goals       SLP Education - 11/22/19 1700    Education Details  flow  phonation    Person(s) Educated  Patient    Methods  Explanation    Comprehension  Verbalized understanding         SLP Long Term Goals - 10/20/19 1055      SLP LONG TERM GOAL #1   Title  The patient will demonstrate independent completion of extrinsic laryngeal muscle stretches.    Baseline  Patient very tight, requires supervision for accurate performance    Time  8    Period  Weeks    Status  New    Target Date  12/15/19      SLP LONG TERM GOAL #2   Title  The patient will be independent for abdominal breathing and breath support exercises.    Baseline  Patient requires max cues to execute accurately    Time  8    Period   Weeks    Status  New    Target Date  12/15/19      SLP LONG TERM GOAL #3   Title  The patient will minimize vocal tension via flow phonation and/or resonant voice therapy (or comparable techniques) with min SLP cues with 80% accuracy.    Baseline  Tight/strained vocal quality    Time  8    Period  Weeks    Status  New    Target Date  12/15/19      SLP LONG TERM GOAL #4   Title  Pt will improve speech intelligibility for sentences by controlling rate of speech, over-articulation, and increased loudness to achieve 80% intelligibility.    Baseline  Patient cannot increase volume without vocal strain    Time  8    Period  Weeks    Status  New    Target Date  12/15/19       Plan - 11/22/19 1701    Clinical Impression Statement  Patient demosntrates an improvement in breath support, although he does need cues from the clinician to avoid breathholding. He demonstrates improved awarenss of hoarse/strained voice. He continues to present with drooling.    Speech Therapy Frequency  1x /week    Duration  Other (comment)    Potential to Achieve Goals  Good    Potential Considerations  Ability to learn/carryover information;Previous level of function;Severity of impairments;Cooperation/participation level;Family/community support    SLP Home Exercise Plan  Provided    Consulted and Agree with Plan of Care  Patient;Family member/caregiver    Family Member Consulted  Spouse       Patient will benefit from skilled therapeutic intervention in order to improve the following deficits and impairments:   Dysarthria and anarthria    Problem List Patient Active Problem List   Diagnosis Date Noted  . Paroxysmal atrial fibrillation (HCC) 10/26/2019  . Chest pain of uncertain etiology 09/05/2019  . History of CVA (cerebrovascular accident) 09/05/2019  . Hyperglycemia 09/05/2019  . Anemia 09/05/2019  . AKI (acute kidney injury) (HCC) 09/05/2019  . Atypical pneumonia 09/05/2019  . Coagulation  disorder (HCC) 06/12/2019  . Gastroesophageal reflux disease   . Hemiplegia and hemiparesis following cerebral infarction affecting left non-dominant side (HCC) 11/25/2018  . 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 RBBB 10/15/2018  . Sinus bradycardia 10/15/2018  . Dysphagia, post-stroke   . Neuropathic pain   . Lacunar infarct, acute (HCC) 09/06/2018  . TIA (transient ischemic attack) 09/01/2018  . Microalbuminuria 07/29/2018  . Left leg pain 01/13/2018  . Hyperlipidemia LDL goal <70 02/11/2017  . Recurrent strokes (HCC) 11/10/2016  . Essential  hypertension 11/10/2016  . Diabetes ([place]) 11/10/2016  . Tobacco abuse 08/28/2013    Nancyann Cotterman 11/22/2019, 5:03 PM  [place] MAIN [facility] SERVICES [address] [place], [place], [address] Phone: [phone]   Fax:  [phone]   Name: Evan TILGHMAN Sr. MRN: [id number] Date of Birth: September 28, 1955

## 2019-11-23 ENCOUNTER — Encounter: Payer: Self-pay | Admitting: Occupational Therapy

## 2019-11-23 DIAGNOSIS — E1165 Type 2 diabetes mellitus with hyperglycemia: Secondary | ICD-10-CM | POA: Diagnosis not present

## 2019-11-23 NOTE — Therapy (Signed)
[place] MAIN [facility] SERVICES [address] [place], [place], [address] Phone: [phone]   Fax:  [phone]  Occupational Therapy Treatment  Patient Details  Name: Evan Townsend Sr. MRN: [id number] Date of Birth: 1956/02/23 Referring Provider (OT): Sharlene Dory   Encounter Date: 11/22/2019  OT End of Session - 11/23/19 0921    Visit Number  9    Number of Visits  24    Date for OT Re-Evaluation  12/20/19    Authorization Type  Medicaid    OT Start Time  1600    OT Stop Time  1645    OT Time Calculation (min)  45 min    Activity Tolerance  Patient tolerated treatment well    Behavior During Therapy  [facility] for tasks assessed/performed       Past Medical History:  Diagnosis Date  . Carotid arterial disease ([place])    a. 08/2018 Carotid U/S: min-mod RICA atherosclerosis w/o hemodynamically significant stenosis. Nl LICA.  . Diabetes 1.5, managed as type 2 ([place])   . Diastolic dysfunction    a. 08/2018 Echo: EF 65%. No rwma. Gr1 DD. Mild MR.  Marland Kitchen Hypercholesterolemia   . Hypertension   . Left hemiparesis ([place])    a. Ambulated w/ Cane. Limited use of LUE.  . Pain in both feet   . Poorly controlled diabetes mellitus ([place])    a. 04/2019 A1c 13.8.  Marland Kitchen Recurrent strokes ([place])    a. 10/2016 MRI/A: Acute 68mm R thalamic infarct, ? subacute infarct of R corona radiata; b. 08/2017 MRI/A: Acute 59mm lateral L thalamic infarct. Other more remote lacunar infarcts of thalami bilat. Small vessel dzs; c. 08/2018 MRI/A: Acute lacunar infarct of the post limb of R internal capsule. Underlying advanced chrnoic small vessel dzs w/o hemodynamically significant stenosis.  . Tobacco abuse     Past Surgical History:  Procedure Laterality Date  . ESOPHAGOGASTRODUODENOSCOPY (EGD) WITH PROPOFOL N/A 04/26/2019   Procedure: ESOPHAGOGASTRODUODENOSCOPY (EGD) WITH PROPOFOL;  Surgeon: Lin Landsman, MD;  Location: [place];  Service: Gastroenterology;  Laterality: N/A;     There were no vitals filed for this visit.  Subjective Assessment - 11/23/19 0919    Subjective   Pt. reports left elbow tightness    Patient is accompanied by:  Evan Townsend    Pertinent History  Pt. is a 64 y.o. male with a history of a CVA with left hemiplegia one year ago was admitted to [facility] on 09/04/2019 with Atypical Pneumonia, Diabetic Ketoacidosis without coma. Pt. was then admitted to [facility] received inpatient rehabilitation services. Pt. resides with his wife who assists with all ADLs, and IADLs. Pt. was previously indepedent with ADL tasks, worked in Nurse, learning disability, and enjoys fishing.    Limitations  LUE functioning, ADLs, and IADLs    Patient Stated Goals  To be able to put his shirt on by himself    Currently in Pain?  Yes    Pain Score  2     Pain Location  Hand    Pain Orientation  Left    Pain Descriptors / Indicators  --   stiffness   Pain Type  Chronic pain      OT TREATMENT  Neuro muscular re-education:  Evan TownsendTolerated weightbearing, and proprioceptive input through the LUE, and handto inhibit tone, and prepare the UE for ROM, and facilitation of active movement. Pt. workedonweightbearing with a flat open handwith slow forward rocking, and preparing the hand for weightbearing positionwhen seated. Pt.  worked on alternating weightbearing with functional reaching.Evan Townsendworked onreaching with the left hand forcones, andleaning forward with the trunk to place them onto atabletopsurface. Pt. worked on extending his digits to release the cones. Evan Townsendpresented with more active digit extensionconsistently throughout the session today.  Therapeutic Exercise:  Pt. tolerated PROM/AAROM/AROMin all joint ranges of the LUE, and hand with slow gentle stretching is tone reducing patterns.   Manual Therapy:  Pt. Tolerated scapular mobilization in elevation, depression, and rotation while in sidelying to prepare the LUE for ROM.Pt. Tolerated soft tissue  mobilizations of radius on ulna for supination secondary to tone, and tightness in pronators.Pt. Tolerated soft tissue mobilizations metacarpal spread stretches to the left hand prior to ROM. Manual techniques were performed prior to, and independent of ROM/ther. Pt. Tolerated retrograde massage for edema control to the left hand. Pt. education was provided about performing edema control on himself at home using his right hand.   Pt. reports that the hand splint continues to work well for him at night. Pt. continues to present with flexor tone, tightness, and spasticity in his left UE, and hand. Pt. Reported 2/10 pain and edema in the left hand. Pt. Responded well to retrograde massage, and manual techniques to his hand reporting no pain, and decreased edema following treatment. Pt. Responded well to the inhibitory techniques. Pt. Continues to present with more active digit extension consistently with facilitation, and when releasing his hand off of objects. Pt. continues to work on improving, and normalizing tone in the LUE, and facilitating increased active volitional movement in order to increase LUE engagement during ADLs, and IADL tasks.                     OT Education - 11/23/19 0920    Education Details  LUEROM    Person(s) Educated  Patient    Methods  Explanation    Comprehension  Verbalized understanding;Returned demonstration          OT Long Term Goals - 09/27/19 1711      OT LONG TERM GOAL #1   Title  Pt. will be independent with donning his shirt using one armed dressing techniques    Baseline  Eval: Pt. requires assist form caregiver    Time  12    Period  Weeks    Status  New    Target Date  12/20/19      OT LONG TERM GOAL #2   Title  Pt. will donn LE dressing with modified independence    Baseline  Eval: MaxA    Time  12    Period  Weeks    Status  New    Target Date  12/20/19      OT LONG TERM GOAL #3   Title  Pt. will improve left digit  flexion to be able to hold a toothbrush while applying toothpaste.    Baseline  Eval: Pt. is unable    Time  12    Period  Weeks    Status  New    Target Date  12/20/19      OT LONG TERM GOAL #4   Title  Pt. will improve left shoulder flexion,and abduction AROM by 20 degrees to be able to reach his head in preparation for brushing his hair.    Baseline  Eval: Pt. is unable    Time  12    Period  Weeks    Status  New    Target Date  12/20/19  OT LONG TERM GOAL #5   Title  Pt. will be independent with HEP for the LUE, and hand    Baseline  Eval: Pt. does not have a HEP    Time  12    Period  Weeks    Status  New    Target Date  12/20/19            Plan - 11/23/19 5625    Clinical Impression Statement  Pt. reports that the hand splint continues to work well for him at night. Pt. continues to present with flexor tone, tightness, and spasticity in his left UE, and hand. Pt. Reported 2/10 pain and edema in the left hand. Pt. Responded well to retrograde massage, and manual techniques to his hand reporting no pain, and decreased edema following treatment. Pt. Responded well to the inhibitory techniques. Pt. Continues to present with more active digit extension consistently with facilitation, and when releasing his hand off of objects. Pt. continues to work on improving, and normalizing tone in the LUE, and facilitating increased active volitional movement in order to increase LUE engagement during ADLs, and IADL tasks.    OT Occupational Profile and History  Detailed Assessment- Review of Records and additional review of physical, cognitive, psychosocial history related to current functional performance    Occupational performance deficits (Please refer to evaluation for details):  ADL's;IADL's    Body Structure / Function / Physical Skills  ADL;GMC;Strength;IADL;FMC    Rehab Potential  Good    Clinical Decision Making  Multiple treatment options, significant modification of task  necessary    Modification or Assistance to Complete Evaluation   Max significant modification of tasks or assist is necessary to complete    OT Frequency  2x / week    OT Duration  12 weeks    OT Treatment/Interventions  Self-care/ADL training;Therapeutic exercise;Patient/Evan education;DME and/or AE instruction;Neuromuscular education;Manual Therapy;Therapeutic activities;Splinting;Moist Heat;Electrical Stimulation    OT Home Exercise Plan  More stretching exercises on tabletop and moist heat at home    Consulted and Agree with Plan of Care  Patient;Evan Townsend/caregiver       Patient will benefit from skilled therapeutic intervention in order to improve the following deficits and impairments:   Body Structure / Function / Physical Skills: ADL, GMC, Strength, IADL, [facility]       Visit Diagnosis: Muscle weakness (generalized)    Problem List Patient Active Problem List   Diagnosis Date Noted  . Paroxysmal atrial fibrillation (HCC) 10/26/2019  . Chest pain of uncertain etiology 09/05/2019  . History of CVA (cerebrovascular accident) 09/05/2019  . Hyperglycemia 09/05/2019  . Anemia 09/05/2019  . AKI (acute kidney injury) (HCC) 09/05/2019  . Atypical pneumonia 09/05/2019  . Coagulation disorder (HCC) 06/12/2019  . Gastroesophageal reflux disease   . Hemiplegia and hemiparesis following cerebral infarction affecting left non-dominant side (HCC) 11/25/2018  . 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 RBBB 10/15/2018  . Sinus bradycardia 10/15/2018  . Dysphagia, post-stroke   . Neuropathic pain   . Lacunar infarct, acute (HCC) 09/06/2018  . TIA (transient ischemic attack) 09/01/2018  . Microalbuminuria 07/29/2018  . Left leg pain 01/13/2018  . Hyperlipidemia LDL goal <70 02/11/2017  . Recurrent strokes (HCC) 11/10/2016  . Essential hypertension 11/10/2016  . Diabetes (HCC) 11/10/2016  . Tobacco abuse 08/28/2013    Olegario Messier, MS, OTR/L 11/23/2019, 6:09 PM  [place] [facility] MAIN [facility] SERVICES [address] [place], [place], [address] Phone: [phone]   Fax:  [phone]  Name: ZENON LEAF Sr. MRN: [id number] Date of Birth: 09/19/1955

## 2019-11-24 ENCOUNTER — Encounter: Payer: Medicaid Other | Admitting: Speech Pathology

## 2019-11-24 DIAGNOSIS — E1165 Type 2 diabetes mellitus with hyperglycemia: Secondary | ICD-10-CM | POA: Diagnosis not present

## 2019-11-25 DIAGNOSIS — E1165 Type 2 diabetes mellitus with hyperglycemia: Secondary | ICD-10-CM | POA: Diagnosis not present

## 2019-11-26 DIAGNOSIS — E1165 Type 2 diabetes mellitus with hyperglycemia: Secondary | ICD-10-CM | POA: Diagnosis not present

## 2019-11-27 DIAGNOSIS — E1165 Type 2 diabetes mellitus with hyperglycemia: Secondary | ICD-10-CM | POA: Diagnosis not present

## 2019-11-28 ENCOUNTER — Ambulatory Visit: Payer: Medicaid Other | Admitting: Occupational Therapy

## 2019-11-28 ENCOUNTER — Other Ambulatory Visit: Payer: Self-pay

## 2019-11-28 ENCOUNTER — Encounter: Payer: Self-pay | Admitting: Physical Therapy

## 2019-11-28 ENCOUNTER — Ambulatory Visit: Payer: Medicaid Other | Admitting: Physical Therapy

## 2019-11-28 DIAGNOSIS — R278 Other lack of coordination: Secondary | ICD-10-CM

## 2019-11-28 DIAGNOSIS — R2681 Unsteadiness on feet: Secondary | ICD-10-CM | POA: Diagnosis not present

## 2019-11-28 DIAGNOSIS — I69354 Hemiplegia and hemiparesis following cerebral infarction affecting left non-dominant side: Secondary | ICD-10-CM

## 2019-11-28 DIAGNOSIS — R490 Dysphonia: Secondary | ICD-10-CM | POA: Diagnosis not present

## 2019-11-28 DIAGNOSIS — I6381 Other cerebral infarction due to occlusion or stenosis of small artery: Secondary | ICD-10-CM | POA: Diagnosis not present

## 2019-11-28 DIAGNOSIS — R471 Dysarthria and anarthria: Secondary | ICD-10-CM | POA: Diagnosis not present

## 2019-11-28 DIAGNOSIS — R2689 Other abnormalities of gait and mobility: Secondary | ICD-10-CM | POA: Diagnosis not present

## 2019-11-28 DIAGNOSIS — M79602 Pain in left arm: Secondary | ICD-10-CM | POA: Diagnosis not present

## 2019-11-28 DIAGNOSIS — R262 Difficulty in walking, not elsewhere classified: Secondary | ICD-10-CM | POA: Diagnosis not present

## 2019-11-28 DIAGNOSIS — E1165 Type 2 diabetes mellitus with hyperglycemia: Secondary | ICD-10-CM | POA: Diagnosis not present

## 2019-11-28 DIAGNOSIS — M6281 Muscle weakness (generalized): Secondary | ICD-10-CM | POA: Diagnosis not present

## 2019-11-28 NOTE — Therapy (Signed)
[place] MAIN [facility] SERVICES [address] [place], [place], [address] Phone: [phone]   Fax:  [phone]  Physical Therapy Treatment /Discharge summary   Patient Details  Name: Evan ERICSSON Sr. MRN: [id number] Date of Birth: 05-01-1956 Referring Provider (PT): Sharlene Dory   Encounter Date: 11/28/2019  PT End of Session - 11/28/19 1013    Visit Number  9    Number of Visits  16    Date for PT Re-Evaluation  01/17/20    Authorization Time Period  authorized 9 visits 10/04/19- 12/05/19    Authorization - Visit Number  8    Authorization - Number of Visits  9    PT Start Time  0932    PT Stop Time  1012    PT Time Calculation (min)  40 min    Equipment Utilized During Treatment  Gait belt;Other (comment)   L swedish knee brace, AFO   Activity Tolerance  Patient tolerated treatment well    Behavior During Therapy  [facility] for tasks assessed/performed       Past Medical History:  Diagnosis Date  . Carotid arterial disease ([place])    a. 08/2018 Carotid U/S: min-mod RICA atherosclerosis w/o hemodynamically significant stenosis. Nl LICA.  . Diabetes 1.5, managed as type 2 ([place])   . Diastolic dysfunction    a. 08/2018 Echo: EF 65%. No rwma. Gr1 DD. Mild MR.  Marland Kitchen Hypercholesterolemia   . Hypertension   . Left hemiparesis ([place])    a. Ambulated w/ Cane. Limited use of LUE.  . Pain in both feet   . Poorly controlled diabetes mellitus ([place])    a. 04/2019 A1c 13.8.  Marland Kitchen Recurrent strokes ([place])    a. 10/2016 MRI/A: Acute 71m R thalamic infarct, ? subacute infarct of R corona radiata; b. 08/2017 MRI/A: Acute 86mlateral L thalamic infarct. Other more remote lacunar infarcts of thalami bilat. Small vessel dzs; c. 08/2018 MRI/A: Acute lacunar infarct of the post limb of R internal capsule. Underlying advanced chrnoic small vessel dzs w/o hemodynamically significant stenosis.  . Tobacco abuse     Past Surgical History:  Procedure Laterality Date  .  ESOPHAGOGASTRODUODENOSCOPY (EGD) WITH PROPOFOL N/A 04/26/2019   Procedure: ESOPHAGOGASTRODUODENOSCOPY (EGD) WITH PROPOFOL;  Surgeon: VaLin LandsmanMD;  [place] Service: Gastroenterology;  Laterality: N/A;    There were no vitals filed for this visit.  Subjective Assessment - 11/28/19 1012    Subjective  Patient has no new concerns and no pain today.    Patient is accompained by:  Family member    Pertinent History  Patient is a pleasant [age]ear old male returning to PT (evaluation on 09/04/19) with PMH of HTN, HLD, Type II DM, right basal ganglia stroke (08/2018), acute CVA, sinus bradycardia, RBBB, chronic knee pain, and peripheral neuropathy. Since patient has been seen last he has been admitted to the hospital three times and [facility] left paramedian pontine and right cerebellar ischemic stroke (09/07/19) discharged 09/23/19.  Per CIR documentation patient utilizes AFO and Swedish knee cage on LLE to prevent genu recurvatum. Patient was initially evaluated prior to recent hospitalization for L hemiplegia, went to inpatient rehab in 2020 for first CVA. Has an aide at home    Limitations  Lifting;Standing;Walking;House hold activities    How long can you sit comfortably?  n/a    How long can you stand comfortably?  needs to hold onto objects  How long can you walk comfortably?  needs AD and CGA    Patient Stated Goals  to get back to fishing, walking better, balance.    Currently in Pain?  No/denies    Pain Score  0-No pain    Pain Onset  Yesterday        Treatment: Gait training with loftstrand crutch x 1000 feet x 4 on uneven surfaces including side walks and brick wit CGA and L ankle catching x 3 reps  Transfer training from various surface heights using the loftstrand crutch, reaching for elevator button and reaching back for seat arms for stability with transfer    Patient performed with instruction, verbal cues, tactile cues of  therapist: goal: increase tissue extensibility, promote proper posture, improve mobility                    PT Education - 11/28/19 1012    Education Details  safety with ambualtion on uneven surfaces    Person(s) Educated  Patient    Methods  Explanation    Comprehension  Verbalized understanding;Returned demonstration;Verbal cues required       PT Short Term Goals - 11/22/19 1718      PT SHORT TERM GOAL #1   Title  Patient will be independent with HEP for improved therapeutic gains and self-management.    Baseline  2/10: give next session 4/7: HEP compliant    Time  4    Period  Weeks    Status  Partially Met    Target Date  10/25/19        PT Long Term Goals - 11/22/19 1714      PT LONG TERM GOAL #1   Title  Patient will increase Berg Balance score by > 6 points (30/56) to demonstrate decreased fall risk during functional activities.    Baseline  2/10: 24/56 4/7: 40/56    Time  8    Period  Weeks    Status  Achieved      PT LONG TERM GOAL #2   Title  Patient will increase 10 meter walk test to >1.41ms as to improve gait speed for better community ambulation and to reduce fall risk.    Baseline  2/10: 0.31 m/s with hurrycane and w/c follow 4/7: 0.43 m/s with Lofstrand crutch    Time  8    Period  Weeks    Status  Partially Met    Target Date  01/17/20      PT LONG TERM GOAL #3   Title  Patient (> 633years old) will complete five times sit to stand test in < 15 seconds with no UE support indicating an increased LE strength and improved balance.    Baseline  2/10: 17 seconds heavy RUE support and use only of RLE 4/7: 17 seconds no UE support    Time  8    Period  Weeks    Status  Partially Met    Target Date  01/17/20      PT LONG TERM GOAL #4   Title  Patient will increase BLE gross strength to 4+/5 as to improve functional strength for independent gait, increased standing tolerance and increased ADL ability.    Baseline  2/10: see evaluation note  4/7: R 4+/5 L hip flex 4-/5 abd/add 3+/5 knee extend 4/5 knee flex 3+/5 foot 3/5    Time  8    Period  Weeks    Status  Partially Met  Target Date  01/17/20            Plan - 11/28/19 1013    Clinical Impression Statement  Patient was instructed and educated in safety with walking on uneven surfaces outdoors. He ambulated 1000 feet x 4 on side walk and bumpy surfaces with loft strand crutches and CGA. He has L ankle weakness causing occassional L foot catching.Patient has met goal #1, and partially met goal 2, 3 and 4.  Patient is being DC today for PT and will be DC to HEP and follow up at a free clinic at [facility].   Personal Factors and Comorbidities  Age;Education;Sex;Social Background;Behavior Pattern;Finances;Past/Current Experience;Time since onset of injury/illness/exacerbation;Comorbidity 3+;Transportation    Comorbidities  PMH of HTN, HLD, Type II DM, right basal ganglia stroke (08/2018), acute CVA, sinus bradycardia, RBBB, chronic knee pain, and peripheral neuropathy    Examination-Activity Limitations  Bathing;Bed Mobility;Dressing;Transfers;Squat;Lift;Locomotion Level;Stairs;Reach Overhead;Carry;Stand;Sit    Examination-Participation Restrictions  Interpersonal Relationship;Yard Work;Laundry;Cleaning;Community Activity;Meal Prep;Medication Management;Driving;Shop;Volunteer    Stability/Clinical Decision Making  Unstable/Unpredictable    Rehab Potential  Fair    PT Frequency  1x / week    PT Duration  8 weeks    PT Treatment/Interventions  ADLs/Self Care Home Management;Aquatic Therapy;Moist Heat;Cryotherapy;Gait training;Stair training;Functional mobility training;Therapeutic activities;Therapeutic exercise;Patient/family education;Neuromuscular re-education;Balance training;Orthotic Fit/Training;Manual techniques;Passive range of motion;Dry needling;Taping;Energy conservation;Splinting;Joint Manipulations;Spinal Manipulations;Electrical  Stimulation;Ultrasound;Vestibular;Biofeedback;Iontophoresis 61m/ml Dexamethasone;DME Instruction;Visual/perceptual remediation/compensation    PT Next Visit Plan  last improved by insurance next session    PT Home Exercise Plan  give next one    Consulted and Agree with Plan of Care  Patient;Family member/caregiver       Patient will benefit from skilled therapeutic intervention in order to improve the following deficits and impairments:  Abnormal gait, Decreased balance, Decreased endurance, Decreased mobility, Difficulty walking, Hypomobility, Increased muscle spasms, Impaired tone, Decreased range of motion, Decreased coordination, Decreased strength, Impaired UE functional use, Pain, Decreased activity tolerance, Decreased safety awareness, Impaired perceived functional ability, Impaired sensation, Improper body mechanics, Postural dysfunction, Decreased knowledge of use of DME, Decreased knowledge of precautions, Impaired flexibility, Impaired vision/preception  Visit Diagnosis: Muscle weakness (generalized)  Other lack of coordination  Hemiplegia and hemiparesis following cerebral infarction affecting left non-dominant side (HCC)  Other abnormalities of gait and mobility  Dysarthria and anarthria  Unsteadiness on feet  Dysphonia  Pain in left arm  Lacunar infarct, acute (HCC)  Difficulty in walking, not elsewhere classified     Problem List Patient Active Problem List   Diagnosis Date Noted  . Paroxysmal atrial fibrillation ([place] 10/26/2019  . Chest pain of uncertain etiology 029/56/2130 . History of CVA (cerebrovascular accident) 09/05/2019  . Hyperglycemia 09/05/2019  . Anemia 09/05/2019  . AKI (acute kidney injury) ([place] 09/05/2019  . Atypical pneumonia 09/05/2019  . Coagulation disorder ([place] 06/12/2019  . Gastroesophageal reflux disease   . Hemiplegia and hemiparesis following cerebral infarction affecting left non-dominant side ([place] 11/25/2018  . Tooth  disease 10/20/2018  . RBBB 10/15/2018  . Sinus bradycardia 10/15/2018  . Dysphagia, post-stroke   . Neuropathic pain   . Lac[place]ar infarct, acute ([place] 09/06/2018  . TIA (transient ischemic attack) 09/01/2018  . Microalbuminuria 07/29/2018  . Left leg pain 01/13/2018  . Hyperlipidemia LDL goal <70 02/11/2017  . Recurrent strokes ([place] 11/10/2016  . Essential hypertension 11/10/2016  . Diabetes ([place] 11/10/2016  . Tobacco abuse 08/28/2013    MAlanson Puls, PT DPT 11/28/2019, 10:50 AM  [place] [facility] [address]  [place], [place], [address] Phone: [phone]   Fax:  [phone]  Name: Evan DANIELLO Sr. MRN: [id number] Date of Birth: 11/14/1955

## 2019-11-29 ENCOUNTER — Telehealth: Payer: Self-pay | Admitting: *Deleted

## 2019-11-29 ENCOUNTER — Ambulatory Visit: Payer: Medicaid Other | Admitting: Internal Medicine

## 2019-11-29 DIAGNOSIS — E1165 Type 2 diabetes mellitus with hyperglycemia: Secondary | ICD-10-CM | POA: Diagnosis not present

## 2019-11-29 NOTE — Progress Notes (Deleted)
Follow-up Outpatient Visit Date: 11/29/2019  Primary Care Provider: Hubbard Hartshorn, [place] [address]  Chief Complaint: ***  HPI:  Evan Townsend is a 64 y.o. male with history of recently diagnosed paroxysmal atrial fibrillation, multiple strokes with left hemiparesis, hypertension, hyperlipidemia, uncontrolled diabetes mellitus, and tobacco use, who presents for follow-up of atrial fibrillation and strokes.  I last saw Evan Townsend a month ago after event monitor revealed paroxysmal atrial fibrillation.  He was feeling well at that time.  We agreed to begin anticoagulation with apixaban 5 mg twice daily.  --------------------------------------------------------------------------------------------------  Past Medical History:  Diagnosis Date  . Carotid arterial disease ([place])    a. 08/2018 Carotid U/S: min-mod RICA atherosclerosis w/o hemodynamically significant stenosis. Nl LICA.  . Diabetes 1.5, managed as type 2 ([place])   . Diastolic dysfunction    a. 08/2018 Echo: EF 65%. No rwma. Gr1 DD. Mild MR.  Marland Kitchen Hypercholesterolemia   . Hypertension   . Left hemiparesis ([place])    a. Ambulated w/ Cane. Limited use of LUE.  . Pain in both feet   . Poorly controlled diabetes mellitus ([place])    a. 04/2019 A1c 13.8.  Marland Kitchen Recurrent strokes ([place])    a. 10/2016 MRI/A: Acute 44mm R thalamic infarct, ? subacute infarct of R corona radiata; b. 08/2017 MRI/A: Acute 5mm lateral L thalamic infarct. Other more remote lacunar infarcts of thalami bilat. Small vessel dzs; c. 08/2018 MRI/A: Acute lacunar infarct of the post limb of R internal capsule. Underlying advanced chrnoic small vessel dzs w/o hemodynamically significant stenosis.  . Tobacco abuse    Past Surgical History:  Procedure Laterality Date  . ESOPHAGOGASTRODUODENOSCOPY (EGD) WITH PROPOFOL N/A 04/26/2019   Procedure: ESOPHAGOGASTRODUODENOSCOPY (EGD) WITH PROPOFOL;  Surgeon: Lin Landsman, MD;  Location: [facility];   Service: Gastroenterology;  Laterality: N/A;    No outpatient medications have been marked as taking for the 11/29/19 encounter (Appointment) with Tani Virgo, Harrell Gave, MD.    Allergies: Patient has no known allergies.  Social History   Tobacco Use  . Smoking status: Former Smoker    Packs/day: 1.00    Years: 38.00    Pack years: 38.00    Types: Cigarettes    Start date: 1982  . Smokeless tobacco: Never Used  Substance Use Topics  . Alcohol use: Not Currently    Alcohol/week: 1.0 standard drinks    Types: 1 Cans of beer per week    Comment: previously drank but nothing in 1-2 yrs (08/2019).  . Drug use: Not Currently    Types: Cocaine, Marijuana    Comment: prev used cocaine/marijuana but none x 1-2 yrs (08/2019).    Family History  Problem Relation Age of Onset  . Hypertension Mother   . Stroke Mother        died @ age 37  . Hypertension Father   . Diabetes Father   . Heart attack Father        died @ 65    Review of Systems: A 12-system review of systems was performed and was negative except as noted in the HPI.  --------------------------------------------------------------------------------------------------  Physical Exam: There were no vitals taken for this visit.  General:  *** HEENT: No conjunctival pallor or scleral icterus. Facemask in place. Neck: Supple without lymphadenopathy, thyromegaly, JVD, or HJR. Lungs: Normal work of breathing. Clear to auscultation bilaterally without wheezes or crackles. Heart: Regular rate and rhythm without murmurs, rubs, or gallops. Non-displaced PMI. Abd: Bowel sounds present.  Soft, NT/ND without hepatosplenomegaly Ext: No lower extremity edema. Radial, PT, and DP pulses are 2+ bilaterally. Skin: Warm and dry without rash.  EKG:  ***  Lab Results  Component Value Date   WBC 4.6 11/13/2019   HGB 11.4 (L) 11/13/2019   HCT 34.4 (L) 11/13/2019   MCV 87.1 11/13/2019   PLT 269 11/13/2019    Lab Results  Component Value  Date   NA 140 11/13/2019   K 3.9 11/13/2019   CL 107 11/13/2019   CO2 25 11/13/2019   BUN 29 (H) 11/13/2019   CREATININE 1.20 11/13/2019   GLUCOSE 188 (H) 11/13/2019   ALT 46 (H) 02/15/2019    Lab Results  Component Value Date   CHOL 131 01/31/2019   HDL 47 01/31/2019   LDLCALC 54 01/31/2019   TRIG 239 (H) 01/31/2019   CHOLHDL 2.8 01/31/2019    --------------------------------------------------------------------------------------------------  ASSESSMENT AND PLAN: Cristal Deer Evi Mccomb, MD 11/29/2019 7:15 AM

## 2019-11-29 NOTE — Telephone Encounter (Signed)
-----   Message from [name], MD sent at 11/29/2019  7:17 AM EDT ----- Regarding: Follow-up appointment Good morning,I was looking through Evan Townsend chart and noticed that I just saw him about a month ago to review the results of his monitor.  I think today's appointment was originally scheduled in February, but we wound up seeing him sooner after his monitor showed A. fib.  If he is doing well, it might be best to reschedule today's appointment for 2 months from now.  If he has any concerns, I am happy to see him today.  You mind reaching out to him to see what he prefers?  Thanks.  Thayer Ohm

## 2019-11-29 NOTE — Telephone Encounter (Signed)
Home number listed - "Call cannot be completed at this time. Please try call again later."  Left message to call back on work number.

## 2019-11-29 NOTE — Telephone Encounter (Signed)
Patient called and appointment rescheduled. Patient is doing fine with no complaints at this time.

## 2019-11-30 ENCOUNTER — Other Ambulatory Visit: Payer: Self-pay | Admitting: Family Medicine

## 2019-11-30 ENCOUNTER — Encounter: Payer: Self-pay | Admitting: Occupational Therapy

## 2019-11-30 DIAGNOSIS — E1165 Type 2 diabetes mellitus with hyperglycemia: Secondary | ICD-10-CM | POA: Diagnosis not present

## 2019-11-30 NOTE — Therapy (Signed)
[place] MAIN [facility] SERVICES [address] [place], [place], [address] Phone: [phone]   Fax:  [phone]  Occupational Therapy Treatment/Progress Update/Discharge Summary  Reporting period from 09/27/2019 to 11/28/2019  Patient Details  Name: Evan BALAN Sr. MRN: [id number] Date of Birth: [birth year] Referring Provider (OT): Sharlene Dory   Encounter Date: 11/28/2019  OT End of Session - 11/30/19 1125    Visit Number  10    Number of Visits  24    Date for OT Re-Evaluation  12/20/19    Authorization Type  Medicaid    OT Start Time  0850    OT Stop Time  0930    OT Time Calculation (min)  40 min    Activity Tolerance  Patient tolerated treatment well    Behavior During Therapy  [facility] for tasks assessed/performed       Past Medical History:  Diagnosis Date  . Carotid arterial disease ([place])    a. 08/2018 Carotid U/S: min-mod RICA atherosclerosis w/o hemodynamically significant stenosis. Nl LICA.  . Diabetes 1.5, managed as type 2 ([place])   . Diastolic dysfunction    a. 08/2018 Echo: EF 65%. No rwma. Gr1 DD. Mild MR.  Marland Kitchen Hypercholesterolemia   . Hypertension   . Left hemiparesis ([place])    a. Ambulated w/ Cane. Limited use of LUE.  . Pain in both feet   . Poorly controlled diabetes mellitus ([place])    a. 04/2019 A1c 13.8.  Marland Kitchen Recurrent strokes ([place])    a. 10/2016 MRI/A: Acute 81m R thalamic infarct, ? subacute infarct of R corona radiata; b. 08/2017 MRI/A: Acute 879mlateral L thalamic infarct. Other more remote lacunar infarcts of thalami bilat. Small vessel dzs; c. 08/2018 MRI/A: Acute lacunar infarct of the post limb of R internal capsule. Underlying advanced chrnoic small vessel dzs w/o hemodynamically significant stenosis.  . Tobacco abuse     Past Surgical History:  Procedure Laterality Date  . ESOPHAGOGASTRODUODENOSCOPY (EGD) WITH PROPOFOL N/A 04/26/2019   Procedure: ESOPHAGOGASTRODUODENOSCOPY (EGD) WITH PROPOFOL;  Surgeon: VaLin LandsmanMD;  [facility] Service: Gastroenterology;  Laterality: N/A;    There were no vitals filed for this visit.  Subjective Assessment - 11/30/19 1124    Subjective   Pt reports his wife can assist with exercises at home and would like a written program for ROM for wife to follow.    Pertinent History  Pt. is a [age].o. male with a history of a CVA with left hemiplegia one year ago [facility] with Atypical Pneumonia, Diabetic Ketoacidosis without coma. [facility] Pt. resides with his wife who assists with all ADLs, and IADLs. Pt. was previously indepedent with ADL tasks, worked in ceNurse, learning disabilityand enjoys fishing.    Limitations  LUE functioning, ADLs, and IADLs    Patient Stated Goals  To be able to put his shirt on by himself    Currently in Pain?  No/denies    Pain Score  0-No pain    Multiple Pain Sites  No        Issued written home program for ROM exercises for wife to assist with via [id number] Patient seen for exercises in supine for LUE  Manual mobilizations for scapular elevation, ABD, Add and upwards rotation followed by AAROM.  Patient seen for shoulder flexion to 100 degrees with minimal guiding and assist  from therapist.  Worked towards place and hold of arm at 90 degrees of shoulder flexion.  Cues for return from flexion with slow controlled motion.  Shoulder flexion to 90 degrees with elbow flexion to touch top of head and then extension of elbow.  ABD of shoulder with guiding from therapist.   ROM measurements taken this date since this is patients last visit due to insurance restrictions.   Elbow extension in supine -40 degrees, shoulder flexion 88 degrees.   MCP flexion index holds in flexion at 85 degrees, lacking extension MF 55 degrees RF 42 degrees SF 35 degrees  Grip 15 on left Left lateral pinch 7#, 3 point pinch 7# but fingers  slipping.    Response to tx: Patient has made good progress however has a visitation limit and has used all allocated visits for OT this calendar year.  Patient would continue to benefit from therapy if additional visits were available.  Issued written home program for wife to assist with at home for ROM exercises and to work towards improving LUE functional use.  Goals reviewed and partially met this date.  Refer to goals for details.  Will discharge patient at this time with limitations of insurance coverage, please reconsult if needs arise.                      OT Education - 11/30/19 1125    Education Details  ROM exercises for home program    Person(s) Educated  Patient    Methods  Explanation;Demonstration;Verbal cues    Comprehension  Verbalized understanding;Returned demonstration          OT Long Term Goals - 11/30/19 1127      OT LONG TERM GOAL #1   Title  Pt. will be independent with donning his shirt using one armed dressing techniques    Baseline  Eval: Pt. requires assist form caregiver    Time  12    Period  Weeks    Status  Achieved      OT LONG TERM GOAL #2   Title  Pt. will donn LE dressing with modified independence    Baseline  Eval: MaxA    Time  12    Period  Weeks    Status  Achieved      OT LONG TERM GOAL #3   Title  Pt. will improve left digit flexion to be able to hold a toothbrush while applying toothpaste.    Baseline  Eval: Pt. is unable, cannot hold a toothbrush yet 11/28/2019    Time  12    Period  Weeks    Status  Not Met      OT LONG TERM GOAL #4   Title  Pt. will improve left shoulder flexion,and abduction AROM by 20 degrees to be able to reach his head in preparation for brushing his hair.    Baseline  Eval: Pt. is unable    Time  12    Period  Weeks    Status  Not Met      OT LONG TERM GOAL #5   Title  Pt. will be independent with HEP for the LUE, and hand    Baseline  Eval: Pt. does not have a HEP, 11/28/2019 able to  do some exercises but needs help with most    Time  12    Period  Weeks    Status  Partially Met  Plan - 11/30/19 1151    Clinical Impression Statement  Patient has made good progress however has a visitation limit and has used all allocated visits for OT this calendar year.  Patient would continue to benefit from therapy if additional visits were available.  Issued written home program for wife to assist with at home for ROM exercises and to work towards improving LUE functional use.  Goals reviewed and partially met this date.  Refer to goals for details.  Will discharge patient at this time with limitations of insurance coverage, please reconsult if needs arise.    OT Occupational Profile and History  Detailed Assessment- Review of Records and additional review of physical, cognitive, psychosocial history related to current functional performance    Occupational performance deficits (Please refer to evaluation for details):  ADL's;IADL's    Body Structure / Function / Physical Skills  ADL;GMC;Strength;IADL;FMC    Rehab Potential  Good    Clinical Decision Making  Multiple treatment options, significant modification of task necessary    Modification or Assistance to Complete Evaluation   Max significant modification of tasks or assist is necessary to complete    OT Frequency  2x / week    OT Duration  12 weeks    OT Treatment/Interventions  Self-care/ADL training;Therapeutic exercise;Patient/family education;DME and/or AE instruction;Neuromuscular education;Manual Therapy;Therapeutic activities;Splinting;Moist Heat;Electrical Stimulation    Consulted and Agree with Plan of Care  Patient       Patient will benefit from skilled therapeutic intervention in order to improve the following deficits and impairments:   Body Structure / Function / Physical Skills: ADL, GMC, Strength, IADL, [place]       Visit Diagnosis: Muscle weakness (generalized)  Other lack of  coordination  Hemiplegia and hemiparesis following cerebral infarction affecting left non-dominant side [facility])    Problem List Patient Active Problem List   Diagnosis Date Noted  . Paroxysmal atrial fibrillation ([place]) 10/26/2019  . Chest pain of uncertain etiology 24/82/5003  . History of CVA (cerebrovascular accident) 09/05/2019  . Hyperglycemia 09/05/2019  . Anemia 09/05/2019  . AKI (acute kidney injury) ([place]) 09/05/2019  . Atypical pneumonia 09/05/2019  . Coagulation disorder ([place]) 06/12/2019  . Gastroesophageal reflux disease   . Hemiplegia and hemiparesis following cerebral infarction affecting left non-dominant side ([place]) 11/25/2018  . 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 RBBB 10/15/2018  . Sinus bradycardia 10/15/2018  . Dysphagia, post-stroke   . Neuropathic pain   . Lacunar infarct, acute ([place]) 09/06/2018  . TIA (transient ischemic attack) 09/01/2018  . Microalbuminuria 07/29/2018  . Left leg pain 01/13/2018  . Hyperlipidemia LDL goal <70 02/11/2017  . Recurrent strokes ([place]) 11/10/2016  . Essential hypertension 11/10/2016  . Diabetes ([place]) 11/10/2016  . Tobacco abuse 08/28/2013   Elisheva Fallas T Tomasita Morrow, OTR/L, CLT  Areal Cochrane 11/30/2019, 11:54 AM  [place] MAIN [facility] SERVICES [address] [place], [place], [address] Phone: [phone]   Fax:  [phone]  Name: Evan VALLI Sr. MRN: [id number] Date of Birth: 11-06-1955

## 2019-11-30 NOTE — Telephone Encounter (Signed)
Requested medication (s) are due for refill today -yes  Requested medication (s) are on the active medication list -yes  Future visit scheduled -no  Last refill: 09/04/19  Notes to clinic: Patient Rx was written by outside provider in chart- last note states continue statin- sent for review of request  Requested Prescriptions  Pending Prescriptions Disp Refills   atorvastatin (LIPITOR) 40 MG tablet [Pharmacy Med Name: ATORVASTATIN CALCIUM 40 MG TAB] 90 tablet 0    Sig: TAKE 2 TABLETS BY MOUTH EVERY DAY      Cardiovascular:  Antilipid - Statins Failed - 11/30/2019  1:43 PM      Failed - Triglycerides in normal range and within 360 days    Triglycerides  Date Value Ref Range Status  01/31/2019 239 (H) <150 mg/dL Final    Comment:    . If a non-fasting specimen was collected, consider repeat triglyceride testing on a fasting specimen if clinically indicated.  Yates Decamp et al. J. of Clin. Lipidol. 4403;4:742-595. Marland Kitchen           Passed - Total Cholesterol in normal range and within 360 days    Cholesterol, Total  Date Value Ref Range Status  02/10/2018 161 100 - 199 mg/dL Final   Cholesterol  Date Value Ref Range Status  01/31/2019 131 <200 mg/dL Final          Passed - LDL in normal range and within 360 days    LDL Cholesterol (Calc)  Date Value Ref Range Status  01/31/2019 54 mg/dL (calc) Final    Comment:    Reference range: <100 . Desirable range <100 mg/dL for primary prevention;   <70 mg/dL for patients with CHD or diabetic patients  with > or = 2 CHD risk factors. Marland Kitchen LDL-C is now calculated using the Martin-Hopkins  calculation, which is a validated novel method providing  better accuracy than the Friedewald equation in the  estimation of LDL-C.  Cresenciano Genre et al. Annamaria Helling. 6387;564(33): 2061-2068  (http://education.Quest[employer].com/faq/FAQ164)           Passed - HDL in normal range and within 360 days    HDL  Date Value Ref Range Status  01/31/2019 47 >  OR = 40 mg/dL Final  02/10/2018 64 >39 mg/dL Final          Passed - Patient is not pregnant      Passed - Valid encounter within last 12 months    Recent Outpatient Visits           2 weeks ago Hemiplegia and hemiparesis following cerebral infarction affecting left non-dominant side [facility])   [facility] Lebron Conners D, MD   1 month ago Hemiplegia and hemiparesis following cerebral infarction affecting left non-dominant side [facility])   [facility] Towanda Malkin, MD   4 months ago Essential hypertension   [place], FNP   7 months ago Hemiplegia and hemiparesis following cerebral infarction affecting left non-dominant side [facility])   [place], FNP   10 months ago Neck pain on left side   [place], Astrid Divine, [place]       Future Appointments             In 1 month Vanga, Tally Due, MD [facility]   In 2 months Sharolyn Douglas, Clance Boll, NP [facility], [facility]   In 2 months Delsa Grana, PA-C [facility],  [facility]                Requested Prescriptions  Pending Prescriptions Disp Refills   atorvastatin (LIPITOR) 40 MG tablet [Pharmacy Med Name: ATORVASTATIN CALCIUM 40 MG TAB] 90 tablet 0    Sig: TAKE 2 TABLETS BY MOUTH EVERY DAY      Cardiovascular:  Antilipid - Statins Failed - 11/30/2019  1:43 PM      Failed - Triglycerides in normal range and within 360 days    Triglycerides  Date Value Ref Range Status  01/31/2019 239 (H) <150 mg/dL Final    Comment:    . If a non-fasting specimen was collected, consider repeat triglyceride testing on a fasting specimen if clinically indicated.  Perry Mount et al. J. of Clin. Lipidol. 2015;9:129-169. Marland Kitchen           Passed - Total Cholesterol in normal range and within 360 days    Cholesterol, Total  Date Value Ref Range Status   02/10/2018 161 100 - 199 mg/dL Final   Cholesterol  Date Value Ref Range Status  01/31/2019 131 <200 mg/dL Final          Passed - LDL in normal range and within 360 days    LDL Cholesterol (Calc)  Date Value Ref Range Status  01/31/2019 54 mg/dL (calc) Final    Comment:    Reference range: <100 . Desirable range <100 mg/dL for primary prevention;   <70 mg/dL for patients with CHD or diabetic patients  with > or = 2 CHD risk factors. Marland Kitchen LDL-C is now calculated using the Martin-Hopkins  calculation, which is a validated novel method providing  better accuracy than the Friedewald equation in the  estimation of LDL-C.  Horald Pollen et al. Lenox Ahr. 7616;073(71): 2061-2068  (http://education.[facility].com/faq/FAQ164)           Passed - HDL in normal range and within 360 days    HDL  Date Value Ref Range Status  01/31/2019 47 > OR = 40 mg/dL Final  02/10/9484 64 >46 mg/dL Final          Passed - Patient is not pregnant      Passed - Valid encounter within last 12 months    Recent Outpatient Visits           2 weeks ago Hemiplegia and hemiparesis following cerebral infarction affecting left non-dominant side [facility])   [facility] Welford Roche D, MD   1 month ago Hemiplegia and hemiparesis following cerebral infarction affecting left non-dominant side [facility])   [facility] Jamelle Haring, MD   4 months ago Essential hypertension   [facility] Blessing, Irving Burton E, FNP   7 months ago Hemiplegia and hemiparesis following cerebral infarction affecting left non-dominant side [facility])   [facility] Doren Custard, FNP   10 months ago Neck pain on left side   [facility] [place], Gerome Apley, [place]       Future Appointments             In 1 month Vanga, Loel [place], MD [facility] [place]   In 2 months Brion Aliment, Dois [place], NP [facility], [facility]    In 2 months Danelle Berry, PA-C [facility], [facility]

## 2019-12-01 ENCOUNTER — Ambulatory Visit: Payer: Medicaid Other | Admitting: Speech Pathology

## 2019-12-01 DIAGNOSIS — E1165 Type 2 diabetes mellitus with hyperglycemia: Secondary | ICD-10-CM | POA: Diagnosis not present

## 2019-12-02 DIAGNOSIS — E1165 Type 2 diabetes mellitus with hyperglycemia: Secondary | ICD-10-CM | POA: Diagnosis not present

## 2019-12-03 DIAGNOSIS — E1165 Type 2 diabetes mellitus with hyperglycemia: Secondary | ICD-10-CM | POA: Diagnosis not present

## 2019-12-04 DIAGNOSIS — E1165 Type 2 diabetes mellitus with hyperglycemia: Secondary | ICD-10-CM | POA: Diagnosis not present

## 2019-12-05 ENCOUNTER — Encounter: Payer: Medicaid Other | Admitting: Speech Pathology

## 2019-12-05 ENCOUNTER — Ambulatory Visit: Payer: Medicaid Other | Admitting: Speech Pathology

## 2019-12-05 DIAGNOSIS — E1165 Type 2 diabetes mellitus with hyperglycemia: Secondary | ICD-10-CM | POA: Diagnosis not present

## 2019-12-06 DIAGNOSIS — E1165 Type 2 diabetes mellitus with hyperglycemia: Secondary | ICD-10-CM | POA: Diagnosis not present

## 2019-12-07 DIAGNOSIS — E1165 Type 2 diabetes mellitus with hyperglycemia: Secondary | ICD-10-CM | POA: Diagnosis not present

## 2019-12-08 DIAGNOSIS — E1165 Type 2 diabetes mellitus with hyperglycemia: Secondary | ICD-10-CM | POA: Diagnosis not present

## 2019-12-09 DIAGNOSIS — E1165 Type 2 diabetes mellitus with hyperglycemia: Secondary | ICD-10-CM | POA: Diagnosis not present

## 2019-12-10 DIAGNOSIS — E1165 Type 2 diabetes mellitus with hyperglycemia: Secondary | ICD-10-CM | POA: Diagnosis not present

## 2019-12-11 DIAGNOSIS — E1165 Type 2 diabetes mellitus with hyperglycemia: Secondary | ICD-10-CM | POA: Diagnosis not present

## 2019-12-12 ENCOUNTER — Encounter: Payer: Medicaid Other | Admitting: Speech Pathology

## 2019-12-12 DIAGNOSIS — E1165 Type 2 diabetes mellitus with hyperglycemia: Secondary | ICD-10-CM | POA: Diagnosis not present

## 2019-12-13 DIAGNOSIS — E1165 Type 2 diabetes mellitus with hyperglycemia: Secondary | ICD-10-CM | POA: Diagnosis not present

## 2019-12-14 DIAGNOSIS — E1165 Type 2 diabetes mellitus with hyperglycemia: Secondary | ICD-10-CM | POA: Diagnosis not present

## 2019-12-15 DIAGNOSIS — E1165 Type 2 diabetes mellitus with hyperglycemia: Secondary | ICD-10-CM | POA: Diagnosis not present

## 2019-12-16 DIAGNOSIS — E1165 Type 2 diabetes mellitus with hyperglycemia: Secondary | ICD-10-CM | POA: Diagnosis not present

## 2019-12-17 DIAGNOSIS — E1165 Type 2 diabetes mellitus with hyperglycemia: Secondary | ICD-10-CM | POA: Diagnosis not present

## 2019-12-18 DIAGNOSIS — E1165 Type 2 diabetes mellitus with hyperglycemia: Secondary | ICD-10-CM | POA: Diagnosis not present

## 2019-12-19 ENCOUNTER — Ambulatory Visit: Payer: Medicaid Other | Attending: *Deleted | Admitting: Speech Pathology

## 2019-12-19 DIAGNOSIS — E1165 Type 2 diabetes mellitus with hyperglycemia: Secondary | ICD-10-CM | POA: Diagnosis not present

## 2019-12-20 DIAGNOSIS — E113413 Type 2 diabetes mellitus with severe nonproliferative diabetic retinopathy with macular edema, bilateral: Secondary | ICD-10-CM | POA: Diagnosis not present

## 2019-12-20 DIAGNOSIS — E1165 Type 2 diabetes mellitus with hyperglycemia: Secondary | ICD-10-CM | POA: Diagnosis not present

## 2019-12-21 ENCOUNTER — Ambulatory Visit: Payer: Medicaid Other

## 2019-12-21 ENCOUNTER — Encounter: Payer: Medicaid Other | Admitting: Occupational Therapy

## 2019-12-21 DIAGNOSIS — E1165 Type 2 diabetes mellitus with hyperglycemia: Secondary | ICD-10-CM | POA: Diagnosis not present

## 2019-12-22 DIAGNOSIS — E1165 Type 2 diabetes mellitus with hyperglycemia: Secondary | ICD-10-CM | POA: Diagnosis not present

## 2019-12-23 DIAGNOSIS — E1165 Type 2 diabetes mellitus with hyperglycemia: Secondary | ICD-10-CM | POA: Diagnosis not present

## 2019-12-24 DIAGNOSIS — E1165 Type 2 diabetes mellitus with hyperglycemia: Secondary | ICD-10-CM | POA: Diagnosis not present

## 2019-12-25 ENCOUNTER — Encounter: Payer: Self-pay | Admitting: Podiatry

## 2019-12-25 ENCOUNTER — Ambulatory Visit: Payer: Medicaid Other

## 2019-12-25 ENCOUNTER — Ambulatory Visit (INDEPENDENT_AMBULATORY_CARE_PROVIDER_SITE_OTHER): Payer: Medicaid Other | Admitting: Podiatry

## 2019-12-25 ENCOUNTER — Encounter: Payer: Medicaid Other | Admitting: Occupational Therapy

## 2019-12-25 ENCOUNTER — Ambulatory Visit: Payer: Medicaid Other | Admitting: Speech Pathology

## 2019-12-25 ENCOUNTER — Other Ambulatory Visit: Payer: Self-pay

## 2019-12-25 DIAGNOSIS — E1142 Type 2 diabetes mellitus with diabetic polyneuropathy: Secondary | ICD-10-CM

## 2019-12-25 DIAGNOSIS — B351 Tinea unguium: Secondary | ICD-10-CM

## 2019-12-25 DIAGNOSIS — D689 Coagulation defect, unspecified: Secondary | ICD-10-CM

## 2019-12-25 DIAGNOSIS — E1165 Type 2 diabetes mellitus with hyperglycemia: Secondary | ICD-10-CM | POA: Diagnosis not present

## 2019-12-25 DIAGNOSIS — M79674 Pain in right toe(s): Secondary | ICD-10-CM

## 2019-12-25 DIAGNOSIS — M79675 Pain in left toe(s): Secondary | ICD-10-CM

## 2019-12-25 NOTE — Progress Notes (Signed)
This patient returns to my office for at risk foot care.  This patient requires this care by a professional since this patient will be at risk due to having diabetes and coagulation defect.This patient is unable to cut nails himself since the patient cannot reach his nails.These nails are painful walking and wearing shoes.  This patient presents for at risk foot care today.  General Appearance  Alert, conversant and in no acute stress.  Vascular  Dorsalis pedis and posterior tibial  pulses are palpable  bilaterally.  Capillary return is within normal limits  bilaterally. Temperature is within normal limits  bilaterally.  Neurologic  Senn-Weinstein monofilament wire test within normal limits  bilaterally. Muscle power within normal limits bilaterally.  Nails Thick disfigured discolored nails with subungual debris  from hallux to fifth toes bilaterally. No evidence of bacterial infection or drainage bilaterally.  Orthopedic  No limitations of motion  feet .  No crepitus or effusions noted.  No bony pathology or digital deformities noted.  Skin  normotropic skin with no porokeratosis noted bilaterally.  No signs of infections or ulcers noted.     Onychomycosis  Pain in right toes  Pain in left toes  Consent was obtained for treatment procedures.   Mechanical debridement of nails 1-5  bilaterally performed with a nail nipper.  Filed with dremel without incident. No infection or ulcer.  Healing blister right hallux.  Dispense padding.   Return office visit   3 months                   Told patient to return for periodic foot care and evaluation due to potential at risk complications.   Moise Friday DPM  

## 2019-12-26 DIAGNOSIS — E1165 Type 2 diabetes mellitus with hyperglycemia: Secondary | ICD-10-CM | POA: Diagnosis not present

## 2019-12-27 ENCOUNTER — Ambulatory Visit: Payer: Medicaid Other

## 2019-12-27 ENCOUNTER — Encounter: Payer: Medicaid Other | Admitting: Occupational Therapy

## 2019-12-27 ENCOUNTER — Ambulatory Visit: Payer: Medicaid Other | Admitting: Speech Pathology

## 2019-12-27 DIAGNOSIS — E1165 Type 2 diabetes mellitus with hyperglycemia: Secondary | ICD-10-CM | POA: Diagnosis not present

## 2019-12-28 DIAGNOSIS — E1165 Type 2 diabetes mellitus with hyperglycemia: Secondary | ICD-10-CM | POA: Diagnosis not present

## 2019-12-29 DIAGNOSIS — E1165 Type 2 diabetes mellitus with hyperglycemia: Secondary | ICD-10-CM | POA: Diagnosis not present

## 2019-12-30 DIAGNOSIS — E1165 Type 2 diabetes mellitus with hyperglycemia: Secondary | ICD-10-CM | POA: Diagnosis not present

## 2019-12-31 DIAGNOSIS — E1165 Type 2 diabetes mellitus with hyperglycemia: Secondary | ICD-10-CM | POA: Diagnosis not present

## 2020-01-01 ENCOUNTER — Ambulatory Visit: Payer: Medicaid Other

## 2020-01-01 ENCOUNTER — Encounter: Payer: Medicaid Other | Admitting: Occupational Therapy

## 2020-01-01 DIAGNOSIS — E1165 Type 2 diabetes mellitus with hyperglycemia: Secondary | ICD-10-CM | POA: Diagnosis not present

## 2020-01-02 DIAGNOSIS — E1165 Type 2 diabetes mellitus with hyperglycemia: Secondary | ICD-10-CM | POA: Diagnosis not present

## 2020-01-03 ENCOUNTER — Encounter: Payer: Medicaid Other | Admitting: Occupational Therapy

## 2020-01-03 ENCOUNTER — Ambulatory Visit: Payer: Medicaid Other

## 2020-01-03 DIAGNOSIS — E1165 Type 2 diabetes mellitus with hyperglycemia: Secondary | ICD-10-CM | POA: Diagnosis not present

## 2020-01-04 DIAGNOSIS — E1165 Type 2 diabetes mellitus with hyperglycemia: Secondary | ICD-10-CM | POA: Diagnosis not present

## 2020-01-05 ENCOUNTER — Other Ambulatory Visit: Payer: Self-pay | Admitting: Emergency Medicine

## 2020-01-05 DIAGNOSIS — E1165 Type 2 diabetes mellitus with hyperglycemia: Secondary | ICD-10-CM | POA: Diagnosis not present

## 2020-01-05 DIAGNOSIS — M542 Cervicalgia: Secondary | ICD-10-CM

## 2020-01-05 DIAGNOSIS — M792 Neuralgia and neuritis, unspecified: Secondary | ICD-10-CM

## 2020-01-05 DIAGNOSIS — M79605 Pain in left leg: Secondary | ICD-10-CM

## 2020-01-05 MED ORDER — BACLOFEN 10 MG PO TABS: 10.0000 mg | ORAL_TABLET | Freq: Three times a day (TID) | ORAL | 2 refills | Status: DC

## 2020-01-06 DIAGNOSIS — E1165 Type 2 diabetes mellitus with hyperglycemia: Secondary | ICD-10-CM | POA: Diagnosis not present

## 2020-01-07 DIAGNOSIS — E1165 Type 2 diabetes mellitus with hyperglycemia: Secondary | ICD-10-CM | POA: Diagnosis not present

## 2020-01-08 ENCOUNTER — Encounter: Payer: Medicaid Other | Admitting: Occupational Therapy

## 2020-01-08 ENCOUNTER — Ambulatory Visit: Payer: Medicaid Other

## 2020-01-08 ENCOUNTER — Encounter: Payer: Medicaid Other | Admitting: Speech Pathology

## 2020-01-08 DIAGNOSIS — E1165 Type 2 diabetes mellitus with hyperglycemia: Secondary | ICD-10-CM | POA: Diagnosis not present

## 2020-01-09 DIAGNOSIS — E1165 Type 2 diabetes mellitus with hyperglycemia: Secondary | ICD-10-CM | POA: Diagnosis not present

## 2020-01-10 ENCOUNTER — Ambulatory Visit: Payer: Medicaid Other

## 2020-01-10 ENCOUNTER — Encounter: Payer: Medicaid Other | Admitting: Speech Pathology

## 2020-01-10 ENCOUNTER — Ambulatory Visit: Payer: Medicaid Other | Admitting: Gastroenterology

## 2020-01-10 ENCOUNTER — Encounter: Payer: Self-pay | Admitting: *Deleted

## 2020-01-10 ENCOUNTER — Encounter: Payer: Medicaid Other | Admitting: Occupational Therapy

## 2020-01-10 DIAGNOSIS — E1165 Type 2 diabetes mellitus with hyperglycemia: Secondary | ICD-10-CM | POA: Diagnosis not present

## 2020-01-11 DIAGNOSIS — E1165 Type 2 diabetes mellitus with hyperglycemia: Secondary | ICD-10-CM | POA: Diagnosis not present

## 2020-01-12 DIAGNOSIS — E1165 Type 2 diabetes mellitus with hyperglycemia: Secondary | ICD-10-CM | POA: Diagnosis not present

## 2020-01-13 DIAGNOSIS — E1165 Type 2 diabetes mellitus with hyperglycemia: Secondary | ICD-10-CM | POA: Diagnosis not present

## 2020-01-14 DIAGNOSIS — E1165 Type 2 diabetes mellitus with hyperglycemia: Secondary | ICD-10-CM | POA: Diagnosis not present

## 2020-01-15 DIAGNOSIS — E1165 Type 2 diabetes mellitus with hyperglycemia: Secondary | ICD-10-CM | POA: Diagnosis not present

## 2020-01-16 DIAGNOSIS — E1165 Type 2 diabetes mellitus with hyperglycemia: Secondary | ICD-10-CM | POA: Diagnosis not present

## 2020-01-17 ENCOUNTER — Encounter: Payer: Medicaid Other | Admitting: Speech Pathology

## 2020-01-17 ENCOUNTER — Ambulatory Visit: Payer: Medicaid Other

## 2020-01-17 ENCOUNTER — Ambulatory Visit: Payer: Medicaid Other | Admitting: Internal Medicine

## 2020-01-17 ENCOUNTER — Encounter: Payer: Medicaid Other | Admitting: Occupational Therapy

## 2020-01-17 DIAGNOSIS — E1165 Type 2 diabetes mellitus with hyperglycemia: Secondary | ICD-10-CM | POA: Diagnosis not present

## 2020-01-18 DIAGNOSIS — E1165 Type 2 diabetes mellitus with hyperglycemia: Secondary | ICD-10-CM | POA: Diagnosis not present

## 2020-01-19 DIAGNOSIS — E1165 Type 2 diabetes mellitus with hyperglycemia: Secondary | ICD-10-CM | POA: Diagnosis not present

## 2020-01-20 DIAGNOSIS — E1165 Type 2 diabetes mellitus with hyperglycemia: Secondary | ICD-10-CM | POA: Diagnosis not present

## 2020-01-21 DIAGNOSIS — E1165 Type 2 diabetes mellitus with hyperglycemia: Secondary | ICD-10-CM | POA: Diagnosis not present

## 2020-01-22 DIAGNOSIS — E1165 Type 2 diabetes mellitus with hyperglycemia: Secondary | ICD-10-CM | POA: Diagnosis not present

## 2020-01-22 IMAGING — DX LEFT FOOT - COMPLETE 3+ VIEW
3 series · 3 of 3 positions shown · non-contrast
Comparison: None.

CLINICAL DATA: Left foot pain

EXAM:
LEFT FOOT - COMPLETE 3+ VIEW

[foot ap]
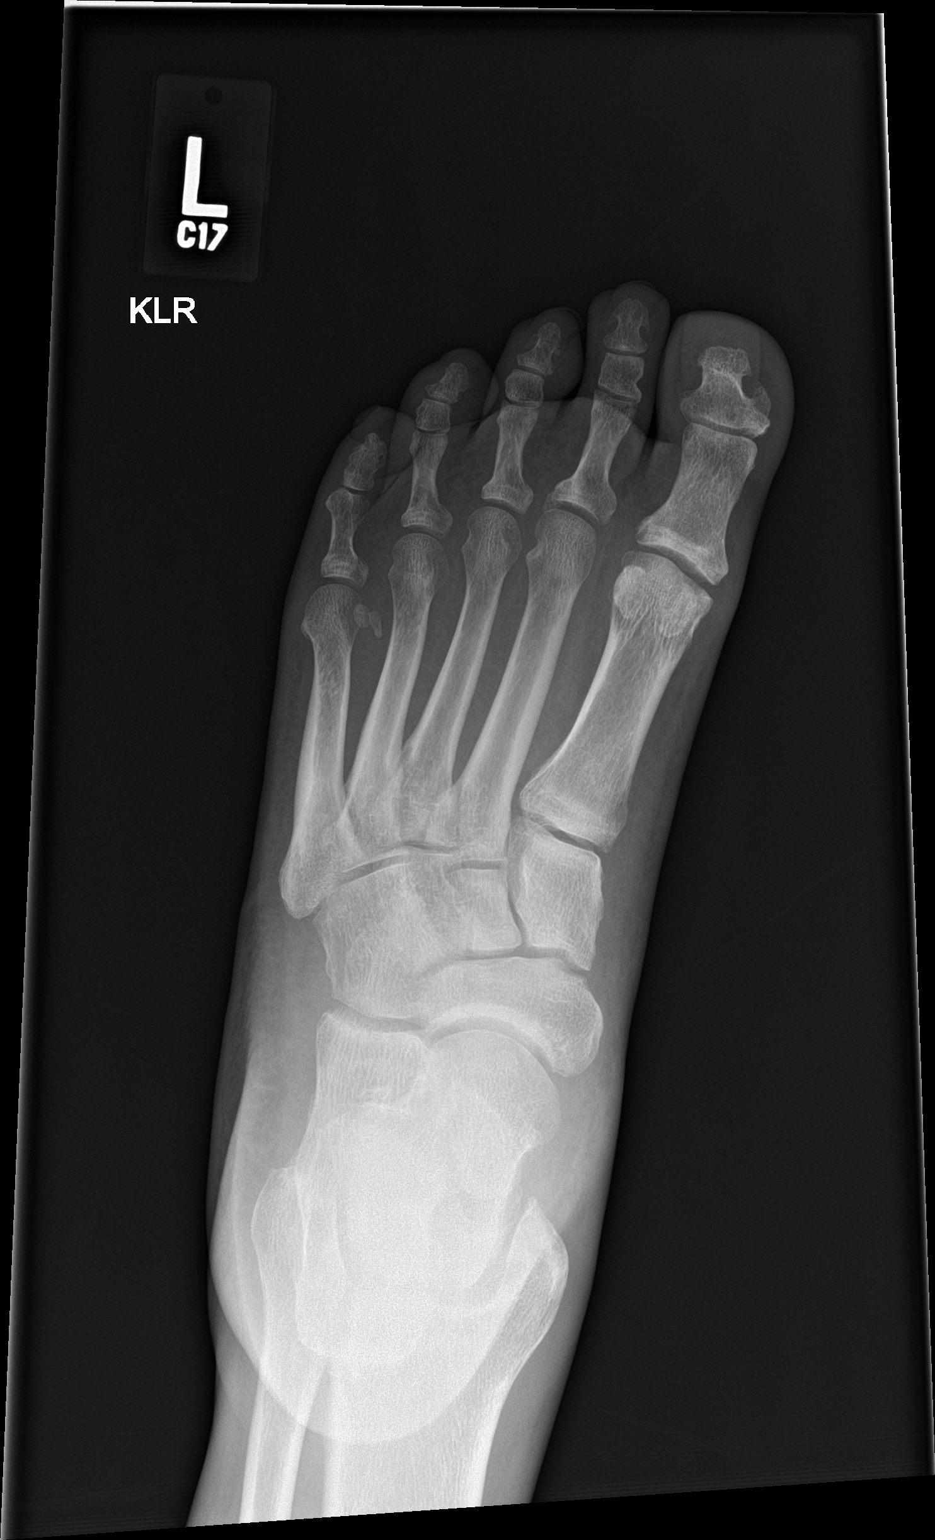

[foot obl]
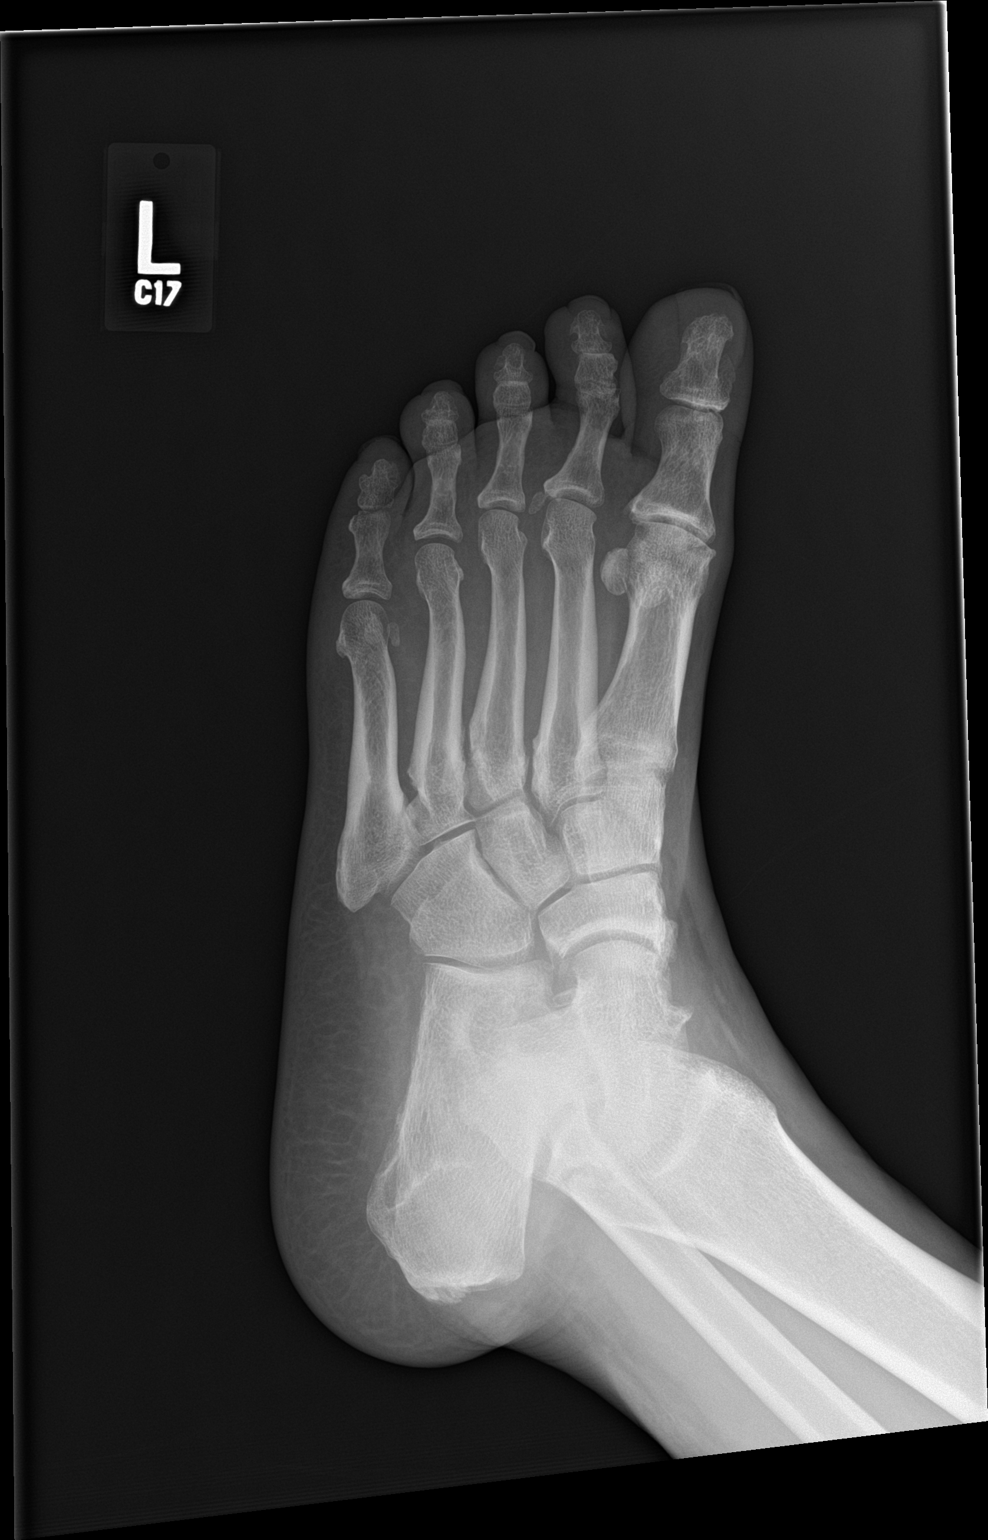

[foot lat]
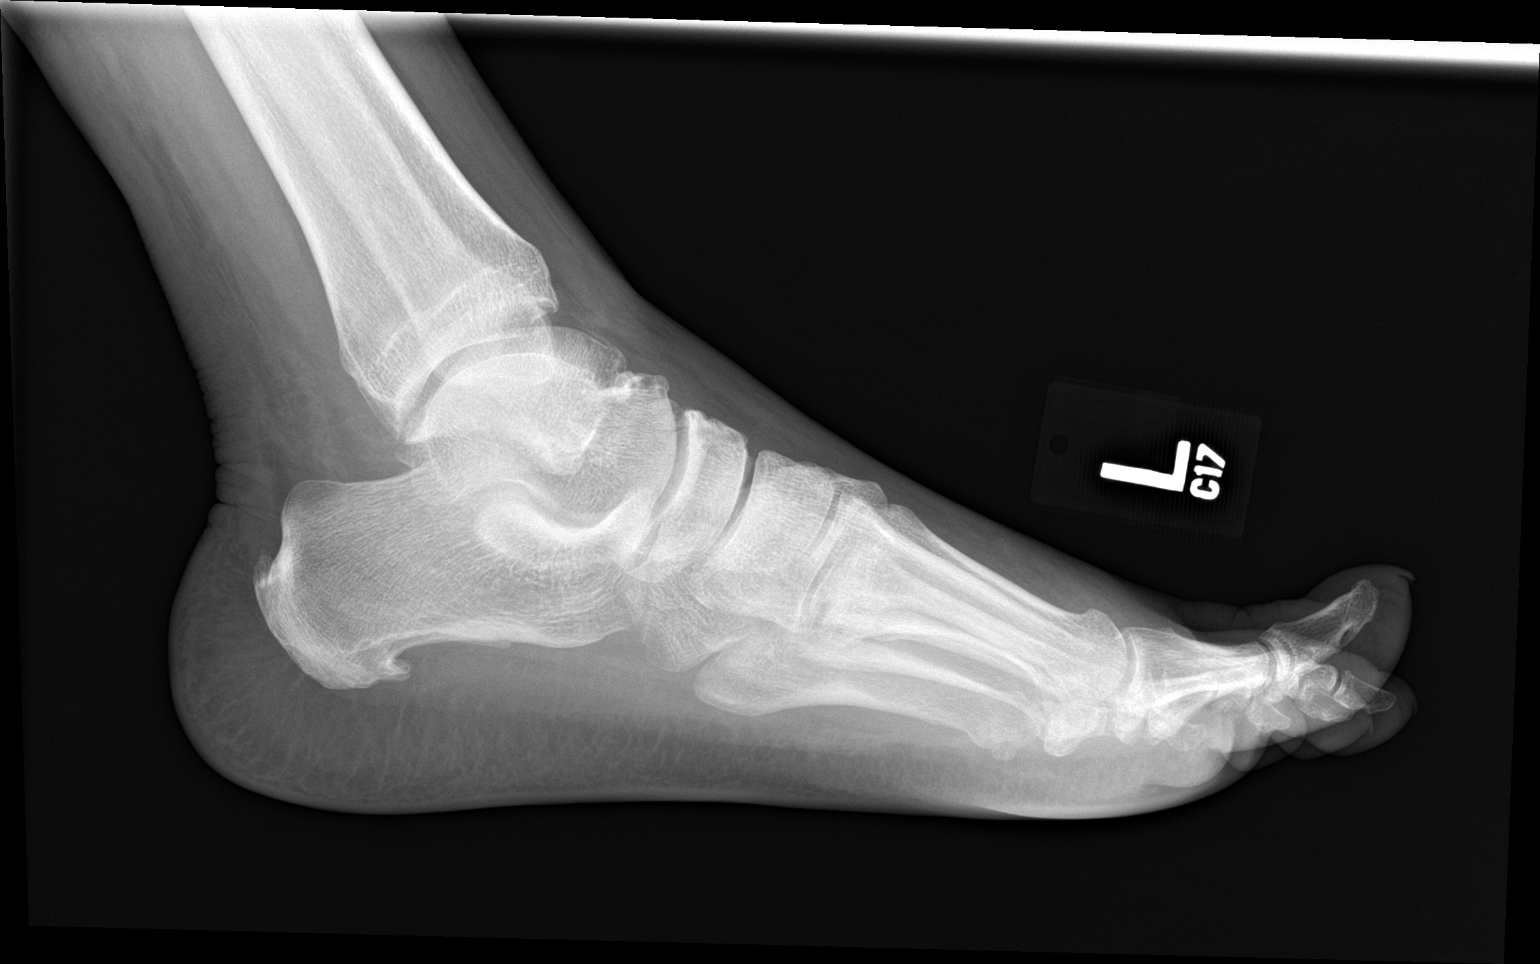

[3 of 3 positions shown; findings below may reference images not displayed]

FINDINGS: Mild spurring at the first MTP joint with potential degenerative
subcortical cysts or erosions along the MTP joint. No definite
characteristic gout erosion identified.

Plantar and Achilles calcaneal spurs. Dorsal spurring of the talar
head.
IMPRESSION: 1. Small subcortical lucencies along the first MTP joint with some
associated spurring. The appearance is nonspecific and could be from
degenerative subcortical cysts or erosions.
2. Plantar and Achilles calcaneal spurs.
3. Dorsal spurring of the talar head.

## 2020-01-23 DIAGNOSIS — E1165 Type 2 diabetes mellitus with hyperglycemia: Secondary | ICD-10-CM | POA: Diagnosis not present

## 2020-01-24 DIAGNOSIS — E1165 Type 2 diabetes mellitus with hyperglycemia: Secondary | ICD-10-CM | POA: Diagnosis not present

## 2020-01-25 DIAGNOSIS — E1165 Type 2 diabetes mellitus with hyperglycemia: Secondary | ICD-10-CM | POA: Diagnosis not present

## 2020-01-26 DIAGNOSIS — E1165 Type 2 diabetes mellitus with hyperglycemia: Secondary | ICD-10-CM | POA: Diagnosis not present

## 2020-01-27 DIAGNOSIS — E1165 Type 2 diabetes mellitus with hyperglycemia: Secondary | ICD-10-CM | POA: Diagnosis not present

## 2020-01-28 DIAGNOSIS — E1165 Type 2 diabetes mellitus with hyperglycemia: Secondary | ICD-10-CM | POA: Diagnosis not present

## 2020-01-29 DIAGNOSIS — E1165 Type 2 diabetes mellitus with hyperglycemia: Secondary | ICD-10-CM | POA: Diagnosis not present

## 2020-01-30 DIAGNOSIS — E1165 Type 2 diabetes mellitus with hyperglycemia: Secondary | ICD-10-CM | POA: Diagnosis not present

## 2020-02-01 DIAGNOSIS — E1165 Type 2 diabetes mellitus with hyperglycemia: Secondary | ICD-10-CM | POA: Diagnosis not present

## 2020-02-02 DIAGNOSIS — E1165 Type 2 diabetes mellitus with hyperglycemia: Secondary | ICD-10-CM | POA: Diagnosis not present

## 2020-02-03 DIAGNOSIS — E1165 Type 2 diabetes mellitus with hyperglycemia: Secondary | ICD-10-CM | POA: Diagnosis not present

## 2020-02-04 DIAGNOSIS — E1165 Type 2 diabetes mellitus with hyperglycemia: Secondary | ICD-10-CM | POA: Diagnosis not present

## 2020-02-05 ENCOUNTER — Ambulatory Visit: Payer: Medicaid Other | Admitting: Podiatry

## 2020-02-05 DIAGNOSIS — E1165 Type 2 diabetes mellitus with hyperglycemia: Secondary | ICD-10-CM | POA: Diagnosis not present

## 2020-02-06 DIAGNOSIS — E1165 Type 2 diabetes mellitus with hyperglycemia: Secondary | ICD-10-CM | POA: Diagnosis not present

## 2020-02-07 DIAGNOSIS — E1165 Type 2 diabetes mellitus with hyperglycemia: Secondary | ICD-10-CM | POA: Diagnosis not present

## 2020-02-08 DIAGNOSIS — E1165 Type 2 diabetes mellitus with hyperglycemia: Secondary | ICD-10-CM | POA: Diagnosis not present

## 2020-02-09 ENCOUNTER — Ambulatory Visit (INDEPENDENT_AMBULATORY_CARE_PROVIDER_SITE_OTHER): Payer: Medicaid Other | Admitting: Nurse Practitioner

## 2020-02-09 ENCOUNTER — Other Ambulatory Visit: Payer: Self-pay

## 2020-02-09 ENCOUNTER — Encounter: Payer: Self-pay | Admitting: Nurse Practitioner

## 2020-02-09 VITALS — BP 104/70 | HR 71 | Ht 66.0 in | Wt 157.0 lb

## 2020-02-09 DIAGNOSIS — I10 Essential (primary) hypertension: Secondary | ICD-10-CM | POA: Diagnosis not present

## 2020-02-09 DIAGNOSIS — I480 Paroxysmal atrial fibrillation: Secondary | ICD-10-CM | POA: Diagnosis not present

## 2020-02-09 DIAGNOSIS — I6310 Cerebral infarction due to embolism of unspecified precerebral artery: Secondary | ICD-10-CM | POA: Diagnosis not present

## 2020-02-09 DIAGNOSIS — E785 Hyperlipidemia, unspecified: Secondary | ICD-10-CM

## 2020-02-09 DIAGNOSIS — E1165 Type 2 diabetes mellitus with hyperglycemia: Secondary | ICD-10-CM | POA: Diagnosis not present

## 2020-02-09 NOTE — Patient Instructions (Signed)
Medication Instructions:  Your physician recommends that you continue on your current medications as directed. Please refer to the Current Medication list given to you today.  *If you need a refill on your cardiac medications before your next appointment, please call your pharmacy*   Lab Work: None ordered If you have labs (blood work) drawn today and your tests are completely normal, you will receive your results only by: Marland Kitchen MyChart Message (if you have MyChart) OR . A paper copy in the mail If you have any lab test that is abnormal or we need to change your treatment, we will call you to review the results.   Testing/Procedures: None ordered   Follow-Up: At [facility], you and your health needs are our priority.  As part of our continuing mission to provide you with exceptional heart care, we have created designated Provider Care Teams.  These Care Teams include your primary Cardiologist (physician) and Advanced Practice Providers (APPs -  Physician Assistants and Nurse Practitioners) who all work together to provide you with the care you need, when you need it.  We recommend signing up for the patient portal called "MyChart".  Sign up information is provided on this After Visit Summary.  MyChart is used to connect with patients for Virtual Visits (Telemedicine).  Patients are able to view lab/test results, encounter notes, upcoming appointments, etc.  Non-urgent messages can be sent to your provider as well.   To learn more about what you can do with MyChart, go to [email or web address].    Your next appointment:   4-6 month(s)  The format for your next appointment:   In Person  Provider:    You may see [name], MD or Nicolasa Ducking, NP

## 2020-02-09 NOTE — Progress Notes (Signed)
Office Visit    Patient Name: Evan DIRK Sr. Date of Encounter: 02/09/2020  Primary Care Provider:  Hubbard Hartshorn, FNP Primary Cardiologist:  Nelva Bush, MD  Chief Complaint    64 y/o ? with a history of strokes, left hemiparesis, PAF, hypertension, hyperlipidemia, poorly controlled diabetes, cerebrovascular and carotid disease, and tobacco abuse, who presents for f/u of afib.  Past Medical History    Past Medical History:  Diagnosis Date  . Carotid arterial disease ([place])    a. 08/2018 Carotid U/S: min-mod RICA atherosclerosis w/o hemodynamically significant stenosis. Nl LICA.  Marland Kitchen Chest pain    a. 08/2019 MV: EF 55%. No ischemia. Diaph attenuation noted.  . Diabetes 1.5, managed as type 2 ([place])   . Diastolic dysfunction    a. 08/2018 Echo: EF 65%. No rwma. Gr1 DD. Mild MR.  . Diastolic dysfunction    a. 08/2019 Echo: EF 55-60%, Gr1 DD. No rwma. Mild MR. RVSP 37.25mHg.  .Marland KitchenHypercholesterolemia   . Hypertension   . Left hemiparesis ([place]    a. Ambulated w/ Cane. Limited use of LUE.  .Marland KitchenPAF (paroxysmal atrial fibrillation) ([place]    a. 10/2019 Event Monitor: PAF; b. CHA2DS2VASc = 4-->Eliquis.  . Pain in both feet   . Poorly controlled diabetes mellitus ([place]    a. 04/2019 A1c 13.8.  .Marland KitchenRecurrent strokes ([place]    a. 10/2016 MRI/A: Acute 557mR thalamic infarct, ? subacute infarct of R corona radiata; b. 08/2017 MRI/A: Acute 15m49materal L thalamic infarct. Other more remote lacunar infarcts of thalami bilat. Small vessel dzs; c. 08/2018 MRI/A: Acute lacunar infarct of the post limb of R internal capsule; d. 08/2019 MRI Acute CVA of L paramedian pons adn R cerebellar hemisphere.  . Tobacco abuse    Past Surgical History:  Procedure Laterality Date  . ESOPHAGOGASTRODUODENOSCOPY (EGD) WITH PROPOFOL N/A 04/26/2019   Procedure: ESOPHAGOGASTRODUODENOSCOPY (EGD) WITH PROPOFOL;  Surgeon: VanLin LandsmanD;  [facility] [place];  Laterality: N/A;     Allergies  No Known Allergies  History of Present Illness    [age]ar old male with above complex past medical history including recurrent strokes, chronic left hemiparesis, paroxysmal atrial fibrillation, hypertension, hyperlipidemia, poorly controlled diabetes, cerebrovascular and carotid disease, and tobacco abuse.  In January 2021, [facility] pain and hyperglycemia.  Echocardiogram showed normal LV function.  Stress testing was undertaken and was nonischemic.  A day after discharge, [facility] to dysarthria, left facial droop, and worsening left-sided weakness.  MRI showed acute stroke in the left paramedian pons and right cerebellar hemisphere.  Following discharge he was transferred to acute inpatient rehab but then discharged home in early February.  He subsequently underwent event monitoring which did show paroxysmal atrial fibrillation and he was placed on Eliquis therapy this March.  Since his last visit in March, he has been doing well.  He ambulates with a cane despite persistent left-sided weakness.  He denies chest pain, dyspnea, palpitations, PND, orthopnea, dizziness, syncope, edema, or early satiety.  Home Medications    Prior to Admission medications   Medication Sig Start Date End Date Taking? Authorizing Provider  amLODipine (NORVASC) 10 MG tablet Take 1 tablet (10 mg total) by mouth daily. 04/14/19 04/13/20 Yes BoyHubbard HartshornNP  apixaban (ELIQUIS) 5 MG TABS tablet Take 1 tablet (5 mg total) by mouth 2 (two) times daily. 10/26/19  Yes End, ChrHarrell GaveD  atorvastatin (  LIPITOR) 40 MG tablet TAKE 2 TABLETS BY MOUTH EVERY DAY 12/01/19  Yes Towanda Malkin, MD  atorvastatin (LIPITOR) 80 MG tablet Take 1 tablet (80 mg total) by mouth every evening. 09/23/18  Yes Angiulli, Lavon Paganini, PA-C  baclofen (LIORESAL) 10 MG tablet Take 1 tablet (10 mg total) by mouth 3 (three) times daily. 01/05/20  Yes Towanda Malkin, MD  Blood Glucose Monitoring Suppl (FIFTY50 GLUCOSE METER 2.0) w/Device KIT Use as instructed 05/05/18  Yes [provider]  gabapentin (NEURONTIN) 300 MG capsule Take 2 capsules (600 mg total) by mouth 2 (two) times daily. 08/07/19  Yes Hubbard Hartshorn, FNP  glipiZIDE (GLUCOTROL) 5 MG tablet TAKE 1 TABLET BY MOUTH DAILY BEFORE BREAKFAST 07/18/19  Yes Hubbard Hartshorn, FNP  insulin detemir (LEVEMIR) 100 UNIT/ML injection Inject 10 Units into the skin daily.   Yes [provider]  Insulin Pen Needle (NOVOFINE) 32G X 6 MM MISC 1 each by Does not apply route once a week. 07/18/19  Yes Hubbard Hartshorn, FNP  lisinopril (ZESTRIL) 40 MG tablet TAKE 1 TABLET BY MOUTH DAILY 07/17/19  Yes Hubbard Hartshorn, FNP  pantoprazole (PROTONIX) 40 MG tablet TAKE 1 TABLET BY MOUTH DAILY BEFORE BREAKFAST 11/13/19  Yes Towanda Malkin, MD    Review of Systems    Left-sided weakness as above.  He denies chest pain, palpitations, dyspnea, pnd, orthopnea, n, v, dizziness, syncope, edema, weight gain, or early satiety.  All other systems reviewed and are otherwise negative except as noted above.  Physical Exam    VS:  BP 104/70 (BP Location: Left Arm, Patient Position: Sitting, Cuff Size: Normal)   Pulse 71   Ht 5' 6"  (1.676 m)   Wt 157 lb (71.2 kg)   SpO2 97%   BMI 25.34 kg/m  , BMI Body mass index is 25.34 kg/m. GEN: Well nourished, well developed, in no acute distress. HEENT: normal. Neck: Supple, no JVD, carotid bruits, or masses. Cardiac: RRR, no murmurs, rubs, or gallops. No clubbing, cyanosis, edema.  Radials/PT 2+ and equal bilaterally.  Respiratory:  Respirations regular and unlabored, clear to auscultation bilaterally. GI: Soft, nontender, nondistended, BS + x 4. MS: no deformity or atrophy. Skin: warm and dry, no rash. Neuro: Left upper and lower extremity weakness noted.  Speech is somewhat garbled. Psych: Normal affect.  Accessory Clinical Findings    ECG personally reviewed  by me today -regular sinus rhythm, 71, right bundle branch block, possible prior inferior infarct- no acute changes.  Lab Results  Component Value Date   WBC 4.6 11/13/2019   HGB 11.4 (L) 11/13/2019   HCT 34.4 (L) 11/13/2019   MCV 87.1 11/13/2019   PLT 269 11/13/2019   Lab Results  Component Value Date   CREATININE 1.20 11/13/2019   BUN 29 (H) 11/13/2019   NA 140 11/13/2019   K 3.9 11/13/2019   CL 107 11/13/2019   CO2 25 11/13/2019   Lab Results  Component Value Date   ALT 46 (H) 02/15/2019   AST 27 02/15/2019   ALKPHOS 133 (H) 02/15/2019   BILITOT 0.4 02/15/2019   Lab Results  Component Value Date   CHOL 131 01/31/2019   HDL 47 01/31/2019   LDLCALC 54 01/31/2019   TRIG 239 (H) 01/31/2019   CHOLHDL 2.8 01/31/2019    Lab Results  Component Value Date   HGBA1C 10.8 (H) 11/13/2019    Assessment & Plan    1.  Paroxysmal atrial fibrillation: Patient with  prior history of strokes and underwent monitoring following most recent stroke in January 2021 which revealed paroxysmal atrial fibrillation.  He has been on Eliquis therapy with recent stable H&H and renal function in March.  He denies palpitations and is in sinus rhythm today.  He has not required an AV nodal blocking agent.  2.  Essential hypertension: Stable on amlodipine and ACE inhibitor therapy.  3.  Hyperlipidemia: LDL of 39 in January of this year [facility]).  4.  Type 2 diabetes mellitus: Historically poorly controlled with an A1c of 10.8 in March of this year.  He is on insulin and glipizide therapy and followed by primary care.  5.  Disposition: Follow-up in clinic in 6 months or sooner if necessary.  Murray Hodgkins, NP 02/09/2020, 5:13 PM

## 2020-02-10 DIAGNOSIS — E1165 Type 2 diabetes mellitus with hyperglycemia: Secondary | ICD-10-CM | POA: Diagnosis not present

## 2020-02-11 DIAGNOSIS — E1165 Type 2 diabetes mellitus with hyperglycemia: Secondary | ICD-10-CM | POA: Diagnosis not present

## 2020-02-12 DIAGNOSIS — E1165 Type 2 diabetes mellitus with hyperglycemia: Secondary | ICD-10-CM | POA: Diagnosis not present

## 2020-02-13 ENCOUNTER — Ambulatory Visit (INDEPENDENT_AMBULATORY_CARE_PROVIDER_SITE_OTHER): Payer: Medicaid Other | Admitting: Family Medicine

## 2020-02-13 ENCOUNTER — Other Ambulatory Visit: Payer: Self-pay

## 2020-02-13 ENCOUNTER — Encounter: Payer: Self-pay | Admitting: Family Medicine

## 2020-02-13 VITALS — BP 120/80 | HR 67 | Temp 97.6°F | Resp 16 | Ht 66.0 in | Wt 156.0 lb

## 2020-02-13 DIAGNOSIS — Z7901 Long term (current) use of anticoagulants: Secondary | ICD-10-CM

## 2020-02-13 DIAGNOSIS — I10 Essential (primary) hypertension: Secondary | ICD-10-CM

## 2020-02-13 DIAGNOSIS — E782 Mixed hyperlipidemia: Secondary | ICD-10-CM

## 2020-02-13 DIAGNOSIS — E1165 Type 2 diabetes mellitus with hyperglycemia: Secondary | ICD-10-CM | POA: Diagnosis not present

## 2020-02-13 DIAGNOSIS — I480 Paroxysmal atrial fibrillation: Secondary | ICD-10-CM

## 2020-02-13 DIAGNOSIS — K219 Gastro-esophageal reflux disease without esophagitis: Secondary | ICD-10-CM

## 2020-02-13 DIAGNOSIS — Z5181 Encounter for therapeutic drug level monitoring: Secondary | ICD-10-CM

## 2020-02-13 DIAGNOSIS — I639 Cerebral infarction, unspecified: Secondary | ICD-10-CM | POA: Diagnosis not present

## 2020-02-13 DIAGNOSIS — Z794 Long term (current) use of insulin: Secondary | ICD-10-CM | POA: Diagnosis not present

## 2020-02-13 DIAGNOSIS — D649 Anemia, unspecified: Secondary | ICD-10-CM

## 2020-02-13 DIAGNOSIS — N1831 Chronic kidney disease, stage 3a: Secondary | ICD-10-CM

## 2020-02-13 MED ORDER — ATORVASTATIN CALCIUM 80 MG PO TABS: 80.0000 mg | ORAL_TABLET | Freq: Every evening | ORAL | 3 refills | Status: DC

## 2020-02-13 MED ORDER — PANTOPRAZOLE SODIUM 40 MG PO TBEC: DELAYED_RELEASE_TABLET | ORAL | 3 refills | Status: DC

## 2020-02-13 MED ORDER — GABAPENTIN 300 MG PO CAPS: 600.0000 mg | ORAL_CAPSULE | Freq: Two times a day (BID) | ORAL | 1 refills | Status: DC

## 2020-02-13 MED ORDER — LISINOPRIL 40 MG PO TABS: 40.0000 mg | ORAL_TABLET | Freq: Every day | ORAL | 3 refills | Status: DC

## 2020-02-13 NOTE — Progress Notes (Signed)
Name: Evan MCCOLLUM Sr.   MRN: [id number]    DOB: Apr 19, 1956   Date:02/13/2020       Progress Note  Chief Complaint  Patient presents with  . Diabetes    follow upm, medication refills  . Hypertension  . Hyperlipidemia     Subjective:   Evan ARQUETTE Sr. is a 64 y.o. male, presents to clinic for DM, HTN, HLD Pt presents for routine f/up, new to me. PCP Maurice Small has moved out of the area.  He saw Dr. Dorris Fetch 2x in the last 6 months - most recently 3 months ago when he was started on apixaban 5 mg twice daily (d/c ASA and clopidogrel) due to CHA2DS2-VASx score 4 with PAF, stroke x 2, HTN and DM  Last OV labs were concerning for A1C of 10.8 Mild anemia H/H 11.4/34.4 Lipids last done in this EMR 01/31/2019 - pt is on a statin  DM- uncontrolled IDDM Lab Results  Component Value Date   HGBA1C 10.8 (H) 11/13/2019   HGBA1C 13.2 (H) 10/02/2019   HGBA1C 14.3 (H) 09/06/2019  last A1C was improving but still uncontrolled, Dr. Rexene Edison advised increasing insulin from 10 units to 13, and f/up with endocrinology (pt on levemir and glipizide?  Per chart med list)  He has not follow up with endocrinology and has not continued to titrate meds  Hypertension:  Currently managed on norvasc, lisinopril Pt reports good med compliance and denies any SE.  No lightheadedness, hypotension, syncope. Blood pressure today is well controlled. BP Readings from Last 3 Encounters:  02/13/20 120/80  02/09/20 104/70  11/13/19 116/78   Pt denies CP, SOB, exertional sx, LE edema, palpitation, Ha's, visual disturbances    Anemia: Hemoglobin  Date Value Ref Range Status  11/13/2019 11.4 (L) 13.2 - 17.1 g/dL Final  15/74/0823 50.9 (L) 13.2 - 17.1 g/dL Final  51/52/0842 48.7 (L) 13.0 - 17.0 g/dL Final  65/86/8977 48.1 (L) 13.0 - 17.0 g/dL Final   HGB  Date Value Ref Range Status  03/15/2013 15.6 13.0 - 18.0 g/dL Final  36/38/2365 40.3 13.0 - 18.0 g/dL Final  anemia has been fairly stable, pt was  to f/up with GI with recent reflux and melena (as of March appt)  Anticoagulant monitoring- pt denies bleeding sx or concerns   Current Outpatient Medications:  .  amLODipine (NORVASC) 10 MG tablet, Take 1 tablet (10 mg total) by mouth daily., Disp: 90 tablet, Rfl: 3 .  apixaban (ELIQUIS) 5 MG TABS tablet, Take 1 tablet (5 mg total) by mouth 2 (two) times daily., Disp: 180 tablet, Rfl: 1 .  atorvastatin (LIPITOR) 80 MG tablet, Take 1 tablet (80 mg total) by mouth every evening., Disp: 30 tablet, Rfl: 0 .  baclofen (LIORESAL) 10 MG tablet, Take 1 tablet (10 mg total) by mouth 3 (three) times daily., Disp: 90 each, Rfl: 2 .  gabapentin (NEURONTIN) 300 MG capsule, Take 2 capsules (600 mg total) by mouth 2 (two) times daily., Disp: 360 capsule, Rfl: 1 .  glipiZIDE (GLUCOTROL) 5 MG tablet, TAKE 1 TABLET BY MOUTH DAILY BEFORE BREAKFAST, Disp: 90 tablet, Rfl: 1 .  insulin detemir (LEVEMIR) 100 UNIT/ML injection, Inject 10 Units into the skin daily., Disp: , Rfl:  .  lisinopril (ZESTRIL) 40 MG tablet, TAKE 1 TABLET BY MOUTH DAILY, Disp: 90 tablet, Rfl: 1 .  pantoprazole (PROTONIX) 40 MG tablet, TAKE 1 TABLET BY MOUTH DAILY BEFORE BREAKFAST, Disp: 30 tablet, Rfl: 3 .  Blood Glucose Monitoring Suppl (  FIFTY50 GLUCOSE METER 2.0) w/Device KIT, Use as instructed (Patient not taking: Reported on 02/13/2020), Disp: , Rfl:  .  Insulin Pen Needle (NOVOFINE) 32G X 6 MM MISC, 1 each by Does not apply route once a week. (Patient not taking: Reported on 02/13/2020), Disp: 100 each, Rfl: 1  Patient Active Problem List   Diagnosis Date Noted  . Paroxysmal atrial fibrillation ([place]) 10/26/2019  . Chest pain of uncertain etiology 74/03/1447  . History of CVA (cerebrovascular accident) 09/05/2019  . Hyperglycemia 09/05/2019  . Anemia 09/05/2019  . AKI (acute kidney injury) ([place]) 09/05/2019  . Atypical pneumonia 09/05/2019  . Coagulation disorder ([place]) 06/12/2019  . Gastroesophageal reflux disease   . Hemiplegia and  hemiparesis following cerebral infarction affecting left non-dominant side ([place]) 11/25/2018  . 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 RBBB 10/15/2018  . Sinus bradycardia 10/15/2018  . Dysphagia, post-stroke   . Neuropathic pain   . Lacunar infarct, acute ([place]) 09/06/2018  . TIA (transient ischemic attack) 09/01/2018  . Microalbuminuria 07/29/2018  . Left leg pain 01/13/2018  . Hyperlipidemia LDL goal <70 02/11/2017  . Recurrent strokes ([place]) 11/10/2016  . Essential hypertension 11/10/2016  . Diabetes ([place]) 11/10/2016  . Tobacco abuse 08/28/2013    Past Surgical History:  Procedure Laterality Date  . ESOPHAGOGASTRODUODENOSCOPY (EGD) WITH PROPOFOL N/A 04/26/2019   Procedure: ESOPHAGOGASTRODUODENOSCOPY (EGD) WITH PROPOFOL;  Surgeon: Lin Landsman, MD;  Location: [facility];  Service: Gastroenterology;  Laterality: N/A;    Family History  Problem Relation Age of Onset  . Hypertension Mother   . Stroke Mother        died @ age 7  . Hypertension Father   . Diabetes Father   . Heart attack Father        died @ 43    Social History   Tobacco Use  . Smoking status: Former Smoker    Packs/day: 1.00    Years: 38.00    Pack years: 38.00    Types: Cigarettes    Start date: 1982  . Smokeless tobacco: Never Used  Vaping Use  . Vaping Use: Never used  Substance Use Topics  . Alcohol use: Not Currently    Alcohol/week: 1.0 standard drink    Types: 1 Cans of beer per week    Comment: previously drank but nothing in 1-2 yrs (08/2019).  . Drug use: Not Currently    Types: Cocaine, Marijuana    Comment: prev used cocaine/marijuana but none x 1-2 yrs (08/2019).     No Known Allergies  Health Maintenance  Topic Date Due  . OPHTHALMOLOGY EXAM  Never done  . COVID-19 Vaccine (1) Never done  . TETANUS/TDAP  Never done  . COLONOSCOPY  Never done  . INFLUENZA VACCINE  03/17/2020  . HEMOGLOBIN A1C  05/15/2020  . FOOT EXAM  06/11/2020  . PNEUMOCOCCAL POLYSACCHARIDE VACCINE AGE 58-64  HIGH RISK  Completed  . Hepatitis C Screening  Completed  . HIV Screening  Completed    Chart Review Today: I personally reviewed active problem list, medication list, allergies, family history, social history, health maintenance, notes from last encounter, lab results, imaging with the patient/caregiver today.   Review of Systems  10 Systems reviewed and are negative for acute change except as noted in the HPI.   Objective:   Vitals:   02/13/20 1313  BP: 120/80  Pulse: 67  Resp: 16  Temp: 97.6 F (36.4 C)  TempSrc: Temporal  SpO2: 98%  Weight: 156 lb (70.8 kg)  Height: _0  (1.676 m)    Body mass index is 25.18 kg/m.  Physical Exam Vitals and nursing note reviewed.  Constitutional:      General: He is not in acute distress.    Appearance: Normal appearance. He is well-developed. He is not ill-appearing, toxic-appearing or diaphoretic.     Interventions: Face mask in place.  HENT:     Head: Normocephalic and atraumatic.     Jaw: No trismus.     Right Ear: External ear normal.     Left Ear: External ear normal.  Eyes:     General: Lids are normal. No scleral icterus.    Conjunctiva/sclera: Conjunctivae normal.     Pupils: Pupils are equal, round, and reactive to light.  Neck:     Trachea: Trachea and phonation normal. No tracheal deviation.  Cardiovascular:     Rate and Rhythm: Normal rate and regular rhythm.     Pulses: Normal pulses.          Radial pulses are 2+ on the right side and 2+ on the left side.       Posterior tibial pulses are 2+ on the right side and 2+ on the left side.     Heart sounds: Normal heart sounds. No murmur heard.  No friction rub. No gallop.   Pulmonary:     Effort: Pulmonary effort is normal. No respiratory distress.     Breath sounds: Normal breath sounds. No stridor. No wheezing, rhonchi or rales.  Abdominal:     General: Bowel sounds are normal. There is no distension.     Palpations: Abdomen is soft.     Tenderness: There  is no abdominal tenderness. There is no guarding or rebound.  Musculoskeletal:        General: Normal range of motion.     Cervical back: Normal range of motion and neck supple.     Right lower leg: No edema.     Left lower leg: No edema.  Skin:    General: Skin is warm and dry.     Capillary Refill: Capillary refill takes less than 2 seconds.     Coloration: Skin is not jaundiced.     Findings: No rash.     Nails: There is no clubbing.  Neurological:     Mental Status: He is alert.     Cranial Nerves: No dysarthria or facial asymmetry.     Motor: No tremor or abnormal muscle tone.     Gait: Gait normal.  Psychiatric:        Mood and Affect: Mood normal.        Speech: Speech normal.        Behavior: Behavior normal. Behavior is cooperative.     Diabetic Foot Exam - Simple   Simple Foot Form Diabetic Foot exam was performed with the following findings: Yes 02/13/2020  1:20 PM  Visual Inspection No deformities, no ulcerations, no other skin breakdown bilaterally: Yes Sensation Testing Intact to touch and monofilament testing bilaterally: Yes Pulse Check Posterior Tibialis and Dorsalis pulse intact bilaterally: Yes Comments Thickened diseased nails, multiple long and thick        Assessment & Plan:   1. Type 2 diabetes mellitus with hyperglycemia, with long-term current use of insulin (HCC) Uncontrolled, pt did not increase insulin, did not f/up with endocrinology Foot exam done today - needs podiatry f/up Instructed pt to monitor CBGs and increase insulin fom 10 - 12 units, monitor fasting CBGs with goal of  80-150 in am, titrate meds up 2-3 units if > 150 (wait at least 3 d between dose adjustments) - COMPLETE METABOLIC PANEL WITH GFR - Hemoglobin A1c - lisinopril (ZESTRIL) 40 MG tablet; Take 1 tablet (40 mg total) by mouth daily.  Dispense: 90 tablet; Refill: 3 - gabapentin (NEURONTIN) 300 MG capsule; Take 2 capsules (600 mg total) by mouth 2 (two) times daily.   Dispense: 360 capsule; Refill: 1 - atorvastatin (LIPITOR) 80 MG tablet; Take 1 tablet (80 mg total) by mouth every evening.  Dispense: 90 tablet; Refill: 3  2. Essential hypertension BP well controlled  - COMPLETE METABOLIC PANEL WITH GFR - lisinopril (ZESTRIL) 40 MG tablet; Take 1 tablet (40 mg total) by mouth daily.  Dispense: 90 tablet; Refill: 3  3. Mixed hyperlipidemia Compliant with statin, encouraged healthy diet/lifestyle, recheck labs HX of stroke, no current CP or myalgias - COMPLETE METABOLIC PANEL WITH GFR - Lipid panel - atorvastatin (LIPITOR) 80 MG tablet; Take 1 tablet (80 mg total) by mouth every evening.  Dispense: 90 tablet; Refill: 3  4. Recurrent strokes ([place]) With PAF, on eliquis   5. Gastroesophageal reflux disease without esophagitis Encouraged to f/up with his GI specialist - pantoprazole (PROTONIX) 40 MG tablet; TAKE 1 TABLET BY MOUTH DAILY BEFORE BREAKFAST  Dispense: 30 tablet; Refill: 3  6. Paroxysmal atrial fibrillation (HCC) Per cardiology, on eliquis, monitor renal function - COMPLETE METABOLIC PANEL WITH GFR  7. Stage 3a chronic kidney disease - COMPLETE METABOLIC PANEL WITH GFR  8. Anemia, unspecified type No current bleeding sx or concerns, but need GI f/up Recheck H/H - CBC with Differential/Platelet  9. Monitoring for anticoagulant use - CBC with Differential/Platelet  10. Encounter for medication monitoring - CBC with Differential/Platelet - COMPLETE METABOLIC PANEL WITH GFR - Lipid panel - Hemoglobin A1c   Return for 3 month f/up with PCP (Dr. Lemmie Evens?) routine f/up.   Delsa Grana, PA-C 02/13/20 1:16 PM

## 2020-02-13 NOTE — Patient Instructions (Addendum)
Follow up with your GI doctor  Follow up with your endocrinologist    Increase your insulin back up to 10 to 12 units and do it daily for a week.  We want your blood sugar to be 100- 150 in the morning before eating.

## 2020-02-14 ENCOUNTER — Telehealth: Payer: Self-pay

## 2020-02-14 DIAGNOSIS — E1165 Type 2 diabetes mellitus with hyperglycemia: Secondary | ICD-10-CM | POA: Diagnosis not present

## 2020-02-14 LAB — CBC WITH DIFFERENTIAL/PLATELET
Absolute Monocytes: 475 cells/uL (ref 200–950)
Basophils Absolute: 29 cells/uL (ref 0–200)
Basophils Relative: 0.6 %
Eosinophils Absolute: 181 cells/uL (ref 15–500)
Eosinophils Relative: 3.7 %
HCT: 38.5 % (ref 38.5–50.0)
Hemoglobin: 12.3 g/dL — ABNORMAL LOW (ref 13.2–17.1)
Lymphs Abs: 2225 cells/uL (ref 850–3900)
MCH: 28.4 pg (ref 27.0–33.0)
MCHC: 31.9 g/dL — ABNORMAL LOW (ref 32.0–36.0)
MCV: 88.9 fL (ref 80.0–100.0)
MPV: 10.1 fL (ref 7.5–12.5)
Monocytes Relative: 9.7 %
Neutro Abs: 1989 cells/uL (ref 1500–7800)
Neutrophils Relative %: 40.6 %
Platelets: 242 10*3/uL (ref 140–400)
RBC: 4.33 10*6/uL (ref 4.20–5.80)
RDW: 13.4 % (ref 11.0–15.0)
Total Lymphocyte: 45.4 %
WBC: 4.9 10*3/uL (ref 3.8–10.8)

## 2020-02-14 LAB — COMPLETE METABOLIC PANEL WITH GFR
AG Ratio: 1.5 (calc) (ref 1.0–2.5)
ALT: 37 U/L (ref 9–46)
AST: 19 U/L (ref 10–35)
Albumin: 4.5 g/dL (ref 3.6–5.1)
Alkaline phosphatase (APISO): 206 U/L — ABNORMAL HIGH (ref 35–144)
BUN/Creatinine Ratio: 20 (calc) (ref 6–22)
BUN: 36 mg/dL — ABNORMAL HIGH (ref 7–25)
CO2: 28 mmol/L (ref 20–32)
Calcium: 10.2 mg/dL (ref 8.6–10.3)
Chloride: 99 mmol/L (ref 98–110)
Creat: 1.78 mg/dL — ABNORMAL HIGH (ref 0.70–1.25)
GFR, Est African American: 46 mL/min/{1.73_m2} — ABNORMAL LOW (ref >=60)
GFR, Est Non African American: 40 mL/min/{1.73_m2} — ABNORMAL LOW (ref >=60)
Globulin: 3.1 g/dL (calc) (ref 1.9–3.7)
Glucose, Bld: 465 mg/dL — ABNORMAL HIGH (ref 65–99)
Potassium: 4.6 mmol/L (ref 3.5–5.3)
Sodium: 135 mmol/L (ref 135–146)
Total Bilirubin: 0.3 mg/dL (ref 0.2–1.2)
Total Protein: 7.6 g/dL (ref 6.1–8.1)

## 2020-02-14 LAB — LIPID PANEL
Cholesterol: 139 mg/dL (ref <200)
HDL: 39 mg/dL — ABNORMAL LOW (ref >=40)
LDL Cholesterol (Calc): 65 mg/dL (calc)
Non-HDL Cholesterol (Calc): 100 mg/dL (calc) (ref <130)
Total CHOL/HDL Ratio: 3.6 (calc) (ref <5.0)
Triglycerides: 267 mg/dL — ABNORMAL HIGH (ref <150)

## 2020-02-14 LAB — HEMOGLOBIN A1C
Hgb A1c MFr Bld: 13.3 % of total Hgb — ABNORMAL HIGH (ref <5.7)
Mean Plasma Glucose: 335 (calc)
eAG (mmol/L): 18.6 (calc)

## 2020-02-14 NOTE — Telephone Encounter (Signed)
Nothing I just got urgent labs

## 2020-02-14 NOTE — Telephone Encounter (Signed)
Please review abnormal labs

## 2020-02-15 ENCOUNTER — Telehealth: Payer: Self-pay

## 2020-02-15 DIAGNOSIS — E1165 Type 2 diabetes mellitus with hyperglycemia: Secondary | ICD-10-CM

## 2020-02-15 NOTE — Telephone Encounter (Signed)
-----   Message from Danelle Berry, [place] sent at 02/14/2020  7:04 PM EDT ----- Please put in referral to [facility] [place] [facility] - urgent please.

## 2020-02-16 DIAGNOSIS — E1165 Type 2 diabetes mellitus with hyperglycemia: Secondary | ICD-10-CM | POA: Diagnosis not present

## 2020-02-17 DIAGNOSIS — E1165 Type 2 diabetes mellitus with hyperglycemia: Secondary | ICD-10-CM | POA: Diagnosis not present

## 2020-02-26 ENCOUNTER — Encounter: Payer: Self-pay | Admitting: Podiatry

## 2020-02-26 ENCOUNTER — Other Ambulatory Visit: Payer: Self-pay

## 2020-02-26 ENCOUNTER — Ambulatory Visit: Payer: Medicaid Other | Admitting: Podiatry

## 2020-02-26 DIAGNOSIS — D689 Coagulation defect, unspecified: Secondary | ICD-10-CM | POA: Diagnosis not present

## 2020-02-26 DIAGNOSIS — M79674 Pain in right toe(s): Secondary | ICD-10-CM

## 2020-02-26 DIAGNOSIS — E1142 Type 2 diabetes mellitus with diabetic polyneuropathy: Secondary | ICD-10-CM | POA: Diagnosis not present

## 2020-02-26 DIAGNOSIS — M79675 Pain in left toe(s): Secondary | ICD-10-CM

## 2020-02-26 DIAGNOSIS — B351 Tinea unguium: Secondary | ICD-10-CM | POA: Diagnosis not present

## 2020-02-26 DIAGNOSIS — N179 Acute kidney failure, unspecified: Secondary | ICD-10-CM | POA: Diagnosis not present

## 2020-02-26 NOTE — Progress Notes (Signed)
This patient returns to my office for at risk foot care.  This patient requires this care by a professional since this patient will be at risk due to having diabetes and coagulation defect.This patient is unable to cut nails himself since the patient cannot reach his nails.These nails are painful walking and wearing shoes.  This patient presents for at risk foot care today.  General Appearance  Alert, conversant and in no acute stress.  Vascular  Dorsalis pedis and posterior tibial  pulses are palpable  bilaterally.  Capillary return is within normal limits  bilaterally. Temperature is within normal limits  bilaterally.  Neurologic  Senn-Weinstein monofilament wire test within normal limits  bilaterally. Muscle power within normal limits bilaterally.  Nails Thick disfigured discolored nails with subungual debris  from hallux to fifth toes bilaterally. No evidence of bacterial infection or drainage bilaterally.  Orthopedic  No limitations of motion  feet .  No crepitus or effusions noted.  No bony pathology or digital deformities noted.  Skin  normotropic skin with no porokeratosis noted bilaterally.  No signs of infections or ulcers noted.     Onychomycosis  Pain in right toes  Pain in left toes  Consent was obtained for treatment procedures.   Mechanical debridement of nails 1-5  bilaterally performed with a nail nipper.  Filed with dremel without incident. No infection or ulcer.  Healing blister right hallux.  Dispense padding.   Return office visit   3 months                   Told patient to return for periodic foot care and evaluation due to potential at risk complications.   Helane Gunther DPM

## 2020-02-29 ENCOUNTER — Ambulatory Visit: Payer: Medicaid Other | Admitting: Podiatry

## 2020-03-05 ENCOUNTER — Ambulatory Visit: Payer: Self-pay | Admitting: Internal Medicine

## 2020-03-05 NOTE — Progress Notes (Deleted)
[name] a 63 y.o.malewith history of multiple strokes with left hemiparesis, hypertension, hyperlipidemia, uncontrolled diabetes mellitus, and ongoing tobacco use, who presents with concerns for diarrhea.

## 2020-03-08 ENCOUNTER — Ambulatory Visit: Payer: Medicaid Other | Admitting: Internal Medicine

## 2020-03-08 ENCOUNTER — Other Ambulatory Visit: Payer: Self-pay

## 2020-03-08 ENCOUNTER — Encounter: Payer: Self-pay | Admitting: Internal Medicine

## 2020-03-08 VITALS — BP 122/80 | HR 62 | Temp 98.3°F | Resp 16 | Ht 66.0 in | Wt 160.5 lb

## 2020-03-08 DIAGNOSIS — I69354 Hemiplegia and hemiparesis following cerebral infarction affecting left non-dominant side: Secondary | ICD-10-CM

## 2020-03-08 DIAGNOSIS — Z794 Long term (current) use of insulin: Secondary | ICD-10-CM | POA: Diagnosis not present

## 2020-03-08 DIAGNOSIS — K219 Gastro-esophageal reflux disease without esophagitis: Secondary | ICD-10-CM | POA: Diagnosis not present

## 2020-03-08 DIAGNOSIS — E1165 Type 2 diabetes mellitus with hyperglycemia: Secondary | ICD-10-CM

## 2020-03-08 DIAGNOSIS — R600 Localized edema: Secondary | ICD-10-CM

## 2020-03-08 NOTE — Patient Instructions (Signed)

## 2020-03-08 NOTE — Progress Notes (Signed)
Patient ID: Evan DEMAREST Sr., male    DOB: 1955-11-03, 64 y.o.   MRN: [id number]  PCP: Towanda Malkin, MD  Chief Complaint  Patient presents with  . Joint Swelling    left ankle swelling and left hand swelling, started about a month ago  . Gastroesophageal Reflux    after eating he burps and feels like his food is going to come up    Subjective:   Evan BOER Sr. is a 64 y.o. male, presents to clinic with CC of the following:  Chief Complaint  Patient presents with  . Joint Swelling    left ankle swelling and left hand swelling, started about a month ago  . Gastroesophageal Reflux    after eating he burps and feels like his food is going to come up    HPI:  Patient is a 64 year old male His last visit was with Threasa Alpha on 02/13/2020 He recommended following up with the gastroenterologist as well as the endocrinologist and she did recommend increasing his insulin some at that time.  His diabetes has been very poorly controlled and he has not been compliant with following up with endocrine. I had seen him previously for follow-up after his stroke and left hemiparesis, with my last follow-up November 13, 2019 and that note reviewed. Wife was present with him for follow-up visit today.  He returns today with complaints of ankle swelling, left-sided, and a little more problematic in the recent past.  Denies any ankle pains, no calf pains or marked swelling or redness or increased warmth of the calf.  He states he has not been to physical therapy/rehab for months, and really wants to get back to that.  He denies any chest pain, palpitations, shortness of breath.  He also notes he has a little more belching after meals, and occasionally has some more reflux symptoms, has not had any vomiting.  Denies any epigastric or upper abdominal pains.  He thinks he needs something stronger for his reflux.  He is currently on Protonix-40 mg daily.  Tries to watch what he eats to limit  reflux, with his wife noting a lessen spaghetti sauce products as one example.  Denies any problems swallowing.  Denies black or dark stools.  He has not seen the endocrinologist as recommended previously, and his wife asked about how taking get that visit scheduled as I emphasized the importance of seeing the endocrinologist to help get his blood sugars better controlled.  That was again recommended after the last visit with my colleague just a few weeks ago.  To review: Hx Stroke/LEFT Hemiplegia:Stroke in January 2019, affecting LEFT side function; was wearing brace on the LEFT LE.Recurrent stroke as above noted, currently in rehab after. This stroke affected speech.Now anticoagulated per cardiology rec's, remains on statin.  GERD: Had prior to recent CVA, On protonix.  He noted in the past that he has a frequent reflux feeling of acid into his mouth, and is bothersome.  Does not feel like the Protonix is that helpful presently.  Patient Active Problem List   Diagnosis Date Noted  . Paroxysmal atrial fibrillation ([place]) 10/26/2019  . Chest pain of uncertain etiology 17/00/1749  . History of CVA (cerebrovascular accident) 09/05/2019  . Hyperglycemia 09/05/2019  . Anemia 09/05/2019  . AKI (acute kidney injury) ([place]) 09/05/2019  . Atypical pneumonia 09/05/2019  . Coagulation disorder ([place]) 06/12/2019  . Gastroesophageal reflux disease   . Hemiplegia and hemiparesis following cerebral infarction affecting left  non-dominant side ([place]) 11/25/2018  . 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 RBBB 10/15/2018  . Sinus bradycardia 10/15/2018  . Dysphagia, post-stroke   . Neuropathic pain   . Lacunar infarct, acute ([place]) 09/06/2018  . TIA (transient ischemic attack) 09/01/2018  . Microalbuminuria 07/29/2018  . Left leg pain 01/13/2018  . Hyperlipidemia LDL goal <70 02/11/2017  . Recurrent strokes ([place]) 11/10/2016  . Essential hypertension 11/10/2016  . Diabetes ([place]) 11/10/2016  . Tobacco abuse  08/28/2013      Current Outpatient Medications:  .  amLODipine (NORVASC) 10 MG tablet, Take 1 tablet (10 mg total) by mouth daily., Disp: 90 tablet, Rfl: 3 .  apixaban (ELIQUIS) 5 MG TABS tablet, Take 1 tablet (5 mg total) by mouth 2 (two) times daily., Disp: 180 tablet, Rfl: 1 .  atorvastatin (LIPITOR) 80 MG tablet, Take 1 tablet (80 mg total) by mouth every evening., Disp: 90 tablet, Rfl: 3 .  baclofen (LIORESAL) 10 MG tablet, Take 1 tablet (10 mg total) by mouth 3 (three) times daily., Disp: 90 each, Rfl: 2 .  Blood Glucose Monitoring Suppl (FIFTY50 GLUCOSE METER 2.0) w/Device KIT, Use as instructed, Disp: , Rfl:  .  gabapentin (NEURONTIN) 300 MG capsule, Take 2 capsules (600 mg total) by mouth 2 (two) times daily., Disp: 360 capsule, Rfl: 1 .  glipiZIDE (GLUCOTROL) 5 MG tablet, TAKE 1 TABLET BY MOUTH DAILY BEFORE BREAKFAST, Disp: 90 tablet, Rfl: 1 .  insulin detemir (LEVEMIR) 100 UNIT/ML injection, Inject 10 Units into the skin daily., Disp: , Rfl:  .  Insulin Pen Needle (NOVOFINE) 32G X 6 MM MISC, 1 each by Does not apply route once a week., Disp: 100 each, Rfl: 1 .  lisinopril (ZESTRIL) 40 MG tablet, Take 1 tablet (40 mg total) by mouth daily., Disp: 90 tablet, Rfl: 3 .  pantoprazole (PROTONIX) 40 MG tablet, TAKE 1 TABLET BY MOUTH DAILY BEFORE BREAKFAST, Disp: 30 tablet, Rfl: 3   No Known Allergies   Past Surgical History:  Procedure Laterality Date  . ESOPHAGOGASTRODUODENOSCOPY (EGD) WITH PROPOFOL N/A 04/26/2019   Procedure: ESOPHAGOGASTRODUODENOSCOPY (EGD) WITH PROPOFOL;  Surgeon: Lin Landsman, MD;  Location: [facility];  Service: Gastroenterology;  Laterality: N/A;     Family History  Problem Relation Age of Onset  . Hypertension Mother   . Stroke Mother        died @ age 74  . Hypertension Father   . Diabetes Father   . Heart attack Father        died @ 22     Social History   Tobacco Use  . Smoking status: Former Smoker    Packs/day: 1.00    Years:  38.00    Pack years: 38.00    Types: Cigarettes    Start date: 1982  . Smokeless tobacco: Never Used  Substance Use Topics  . Alcohol use: Not Currently    Alcohol/week: 1.0 standard drink    Types: 1 Cans of beer per week    Comment: previously drank but nothing in 1-2 yrs (08/2019).    With staff assistance, above reviewed with the patient/caregiver today.  ROS: As per HPI, otherwise no specific complaints on a limited and focused system review   No results found for this or any previous visit (from the past 72 hour(s)).   PHQ2/9: Depression screen [facility] 2/9 03/08/2020 02/13/2020 11/13/2019 10/02/2019 07/18/2019  Decreased Interest 0 1 0 0 0  Down, Depressed, Hopeless 0 1 0 0 0  PHQ - 2 Score  0 2 0 0 0  Altered sleeping 1 1 3 3  0  Tired, decreased energy 0 0 0 1 0  Change in appetite 0 0 0 3 0  Feeling bad or failure about yourself  0 0 0 0 0  Trouble concentrating 0 0 0 0 0  Moving slowly or fidgety/restless 0 0 0 3 0  Suicidal thoughts 0 0 0 0 0  PHQ-9 Score 1 3 3 10  0  Difficult doing work/chores Somewhat difficult Somewhat difficult Not difficult at all Not difficult at all -  Some recent data might be hidden   PHQ-2/9 Result is neg  Fall Risk: Fall Risk  03/08/2020 02/13/2020 11/13/2019 10/02/2019 07/18/2019  Falls in the past year? 0 0 0 0 0  Number falls in past yr: 0 0 0 0 0  Injury with Fall? 0 0 0 0 0  Follow up - Falls evaluation completed - - -      Objective:   Vitals:   03/08/20 1344  BP: 122/80  Pulse: 62  Resp: 16  Temp: 98.3 F (36.8 C)  TempSrc: Temporal  SpO2: 99%  Weight: 160 lb 8 oz (72.8 kg)  Height: 5' 6"  (1.676 m)    Body mass index is 25.91 kg/m.  Physical Exam   NAD, masked,  remains more attentive and interactive, seated in a wheelchair. HEENT - [place]/AT, sclera anicteric,  Neck - supple, no adenopathy,  Car - RRR without m/g/r Pulm- RR and effort normal at rest, CTA without wheeze or rales Abd - soft, NT diffusely with no focal  epigastric discomfort, nondistended Back - no CVA tenderness Ext -no right lower extremity edema, 1+ more diffuse left lower extremity edema with no focal calf tenderness, no cords identified, no increased erythema or increased warmth of the calf area.  No pain with ankle motion, and nontender palpating the medial and lateral malleoli and nontender in the syndesmotic region.  Adequate strength of the ankle, dorsalis pedis pulse intact, sensation intact to light touch in the distal lower extremity.  Bevelyn Buckles was negative. Neuro/psychiatric responded appropriately to questions asked,              Alert              Left-sided weakness persists,              In a wheelchair in the office.  Results for orders placed or performed in visit on 02/13/20  CBC with Differential/Platelet  Result Value Ref Range   WBC 4.9 3.8 - 10.8 Thousand/uL   RBC 4.33 4.20 - 5.80 Million/uL   Hemoglobin 12.3 (L) 13.2 - 17.1 g/dL   HCT 38.5 38 - 50 %   MCV 88.9 80.0 - 100.0 fL   MCH 28.4 27.0 - 33.0 pg   MCHC 31.9 (L) 32.0 - 36.0 g/dL   RDW 13.4 11.0 - 15.0 %   Platelets 242 140 - 400 Thousand/uL   MPV 10.1 7.5 - 12.5 fL   Neutro Abs 1,989 1,500 - 7,800 cells/uL   Lymphs Abs 2,225 850 - 3,900 cells/uL   Absolute Monocytes 475 200 - 950 cells/uL   Eosinophils Absolute 181 15 - 500 cells/uL   Basophils Absolute 29 0 - 200 cells/uL   Neutrophils Relative % 40.6 %   Total Lymphocyte 45.4 %   Monocytes Relative 9.7 %   Eosinophils Relative 3.7 %   Basophils Relative 0.6 %  COMPLETE METABOLIC PANEL WITH GFR  Result Value Ref Range  Glucose, Bld 465 (H) 65 - 99 mg/dL   BUN 36 (H) 7 - 25 mg/dL   Creat 1.78 (H) 0.70 - 1.25 mg/dL   GFR, Est Non African American 40 (L) > OR = 60 mL/min/1.13m   GFR, Est African American 46 (L) > OR = 60 mL/min/1.750m  BUN/Creatinine Ratio 20 6 - 22 (calc)   Sodium 135 135 - 146 mmol/L   Potassium 4.6 3.5 - 5.3 mmol/L   Chloride 99 98 - 110 mmol/L   CO2 28 20 - 32 mmol/L    Calcium 10.2 8.6 - 10.3 mg/dL   Total Protein 7.6 6.1 - 8.1 g/dL   Albumin 4.5 3.6 - 5.1 g/dL   Globulin 3.1 1.9 - 3.7 g/dL (calc)   AG Ratio 1.5 1.0 - 2.5 (calc)   Total Bilirubin 0.3 0.2 - 1.2 mg/dL   Alkaline phosphatase (APISO) 206 (H) 35 - 144 U/L   AST 19 10 - 35 U/L   ALT 37 9 - 46 U/L  Lipid panel  Result Value Ref Range   Cholesterol 139 <200 mg/dL   HDL 39 (L) > OR = 40 mg/dL   Triglycerides 267 (H) <150 mg/dL   LDL Cholesterol (Calc) 65 mg/dL (calc)   Total CHOL/HDL Ratio 3.6 <5.0 (calc)   Non-HDL Cholesterol (Calc) 100 <130 mg/dL (calc)  Hemoglobin A1c  Result Value Ref Range   Hgb A1c MFr Bld 13.3 (H) <5.7 % of total Hgb   Mean Plasma Glucose 335 (calc)   eAG (mmol/L) 18.6 (calc)       Assessment & Plan:   1. Hemiplegia and hemiparesis following cerebral infarction affecting left non-dominant side ([facility] SoundStatus post a prior CVA - Ambulatory referral to Physical Therapy  2. Lower extremity edema Do feel his lower extremity swelling is related to his prior CVA, and limited mobility with some venous stasis concerns as well. Do not feel there is any evidence of a DVT, and discussed this with the wife and patient today and to closely monitor if any more signs of concern arise in that regard.  If so, will want to pursue an ultrasound to further assess. Will refer to physical therapy again as do feel would benefit from further PT/rehab.  They were very much in agreement with this. - Ambulatory referral to Physical Therapy  3. Gastroesophageal reflux disease without esophagitis Discussed that he is on a high dose of the PPI, Protonix presently and encouraged is not having any pain with his reflux symptoms.  Also not vomiting. Discussed reflux precautions at length, and the importance of these in addition to a medicine, and further information provided in the AVS on this. We will continue the Protonix presently, and if symptoms not improved with better reflux precautions in  addition, will follow-up, and he was to follow-up with GI previously as was noted by my colleague on the visit a few weeks ago, and may need a scope at some point if his reflux symptoms are persistent despite the PPI medicine that he is taking.  4. Type 2 diabetes mellitus with hyperglycemia, with long-term current use of insulin (HCC) Emphasized the importance of getting endocrinology involved, specially noting how high his blood sugar was on his last lab check. Asked Melissa to help contact him with endocrinology to get an appointment arranged.  He has a planned follow-up scheduled in September, and can follow-up sooner as needed.   CLTowanda MalkinMD 03/08/20 1:57 PM

## 2020-03-21 ENCOUNTER — Other Ambulatory Visit: Payer: Self-pay

## 2020-03-21 DIAGNOSIS — Z794 Long term (current) use of insulin: Secondary | ICD-10-CM

## 2020-03-21 DIAGNOSIS — E1159 Type 2 diabetes mellitus with other circulatory complications: Secondary | ICD-10-CM

## 2020-03-21 MED ORDER — GLIPIZIDE 5 MG PO TABS: ORAL_TABLET | ORAL | 1 refills | Status: DC

## 2020-03-26 ENCOUNTER — Encounter: Payer: Self-pay | Admitting: Family Medicine

## 2020-05-03 ENCOUNTER — Other Ambulatory Visit: Payer: Self-pay | Admitting: Internal Medicine

## 2020-05-03 NOTE — Telephone Encounter (Signed)
Prescription refill request for Eliquis received.  Last office visit: Brion Aliment, 02/09/2020 Scr: 1.78, 02/13/2020 Age: 64 y.o. Weight: 72.8 kg   Prescription refill sent.

## 2020-05-03 NOTE — Telephone Encounter (Signed)
Refill Request.  

## 2020-05-06 NOTE — Progress Notes (Deleted)
Patient is a 64 year old male Last visit with me was 03/08/2020

## 2020-05-07 ENCOUNTER — Ambulatory Visit: Payer: Medicaid Other | Admitting: Internal Medicine

## 2020-05-15 NOTE — Progress Notes (Signed)
Patient ID: Evan WEDGE Sr., male    DOB: 04-06-1956, 64 y.o.   MRN: [id number]  PCP: Towanda Malkin, MD  Chief Complaint  Patient presents with  . Follow-up    Subjective:   Evan MAURA Sr. is a 64 y.o. male, presents to clinic with CC of the following:  Chief Complaint  Patient presents with  . Follow-up    HPI:  Patient is a 64 year old male Last visit with me was 03/08/2020 Follows up today.   LEswelling, left-sided-noted this complaint on last visit andremains intermittently problematic. Notes some increased left ankle pains at times and at times some pain into his left foot, no calf pains or marked swelling or redness or increased warmth of the calf.  He states he has not been to physical therapy/rehab and I did provide a referral for that on a previous visit.  He very much would like to get back to physical therapy as would his wife. He denies any chest pain, palpitations, shortness of breath.  DM -uncontrolled He has not seen the endocrinologist was recommended previously, and I emphasized the importance of seeing the endocrinologist to help get his blood sugars better controlled last visit.That was alsorecommended after the last visit with my colleagueafewweeksprior. Medication regimen-Levemir insulin 30 units daily, glipizide 5 mg daily  Checking sugars at home sporadically, last 2 weeks ago was last and was 116.  His wife noted she needs to check them more regularly, and asked that she check them at least every other day in the short-term and write the numbers down, and check them fasting in the morning.  Recent Labs       Lab Results  Component Value Date   HGBA1C 13.3 (H) 02/13/2020   HGBA1C 10.8 (H) 11/13/2019   HGBA1C 13.2 (H) 10/02/2019     Recent Labs       Lab Results  Component Value Date   MICROALBUR 32.3 01/31/2019   LDLCALC 65 02/13/2020   CREATININE 1.78 (H) 02/13/2020     On a statin,  on an ACE   Hypertension:  Currently managed onnorvasc, lisinopril Pt reportsgoodmed compliance and denies any SE.   BP Readings from Last 3 Encounters:  05/16/20 130/82  03/08/20 122/80  02/13/20 120/80    Pt denies CP, SOB, palpitation, increased Ha's, visual disturbances  Anemia: Recent Labs       Lab Results  Component Value Date   WBC 4.9 02/13/2020   HGB 12.3 (L) 02/13/2020   HCT 38.5 02/13/2020   MCV 88.9 02/13/2020   PLT 242 02/13/2020      anemia has been fairly stable  CKD      Recent Labs       Lab Results  Component Value Date   CREATININE 1.78 (H) 02/13/2020   BUN 36 (H) 02/13/2020   NA 135 02/13/2020   K 4.6 02/13/2020   CL 99 02/13/2020   CO2 28 02/13/2020      Anticoagulant monitoring-pt denies bleeding sx or concerns, notes his stools can be dark at times, although denies any black, maroon stools, no bleeding per rectum, no other bleeding/easy bruising concerns noted.  To review: Hx Stroke/LEFT Hemiplegia:Stroke in January 2019, affecting LEFT side function; was wearing brace on the LEFT LE.Recurrent stroke as above noted, currently in rehab after. This stroke affected speech. Now anticoagulated per cardiology rec's, remainson statin (he was started on apixaban 5 mg twice daily(d/c ASA and clopidogrel) due to CHA2DS2-VASx score 4  with PAF, stroke x 2, HTN and DM)  GERD: Had prior to recent CVA,On protonix - 40 mg daily  Denies any problems swallowing.  No vomiting Does note that his reflux symptoms are still intermittently problematic, feels it in the epigastric area and up the sternum, discussed importance of dietary modifications with reflux, and his wife noted has been trying to watch/lessen foods that may prompt reflux.           Patient Active Problem List   Diagnosis Date Noted  . Paroxysmal atrial fibrillation ([place]) 10/26/2019  . Chest pain of uncertain etiology 54/56/2563  . History of  CVA (cerebrovascular accident) 09/05/2019  . Hyperglycemia 09/05/2019  . Anemia 09/05/2019  . AKI (acute kidney injury) ([place]) 09/05/2019  . Atypical pneumonia 09/05/2019  . Coagulation disorder ([place]) 06/12/2019  . Gastroesophageal reflux disease   . Hemiplegia and hemiparesis following cerebral infarction affecting left non-dominant side ([place]) 11/25/2018  . 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 RBBB 10/15/2018  . Sinus bradycardia 10/15/2018  . Dysphagia, post-stroke   . Neuropathic pain   . Lacunar infarct, acute ([place]) 09/06/2018  . TIA (transient ischemic attack) 09/01/2018  . Microalbuminuria 07/29/2018  . Left leg pain 01/13/2018  . Hyperlipidemia LDL goal <70 02/11/2017  . Recurrent strokes ([place]) 11/10/2016  . Essential hypertension 11/10/2016  . Diabetes ([place]) 11/10/2016  . Tobacco abuse 08/28/2013      Current Outpatient Medications:  .  atorvastatin (LIPITOR) 80 MG tablet, Take 1 tablet (80 mg total) by mouth every evening., Disp: 90 tablet, Rfl: 3 .  baclofen (LIORESAL) 10 MG tablet, Take 1 tablet (10 mg total) by mouth 3 (three) times daily., Disp: 90 each, Rfl: 2 .  Blood Glucose Monitoring Suppl (FIFTY50 GLUCOSE METER 2.0) w/Device KIT, Use as instructed, Disp: , Rfl:  .  ELIQUIS 5 MG TABS tablet, TAKE 1 TABLET BY MOUTH 2 TIMES DAILY, Disp: 180 tablet, Rfl: 1 .  gabapentin (NEURONTIN) 300 MG capsule, Take 2 capsules (600 mg total) by mouth 2 (two) times daily., Disp: 360 capsule, Rfl: 1 .  glipiZIDE (GLUCOTROL) 5 MG tablet, TAKE 1 TABLET BY MOUTH DAILY BEFORE BREAKFAST, Disp: 90 tablet, Rfl: 1 .  insulin detemir (LEVEMIR) 100 UNIT/ML injection, Inject 10 Units into the skin daily., Disp: , Rfl:  .  Insulin Pen Needle (NOVOFINE) 32G X 6 MM MISC, 1 each by Does not apply route once a week., Disp: 100 each, Rfl: 1 .  lisinopril (ZESTRIL) 40 MG tablet, Take 1 tablet (40 mg total) by mouth daily., Disp: 90 tablet, Rfl: 3 .  pantoprazole (PROTONIX) 40 MG tablet, TAKE 1 TABLET BY MOUTH  DAILY BEFORE BREAKFAST, Disp: 30 tablet, Rfl: 3 .  amLODipine (NORVASC) 10 MG tablet, Take 1 tablet (10 mg total) by mouth daily., Disp: 90 tablet, Rfl: 3   No Known Allergies   Past Surgical History:  Procedure Laterality Date  . ESOPHAGOGASTRODUODENOSCOPY (EGD) WITH PROPOFOL N/A 04/26/2019   Procedure: ESOPHAGOGASTRODUODENOSCOPY (EGD) WITH PROPOFOL;  Surgeon: Lin Landsman, MD;  Location: [facility];  Service: Gastroenterology;  Laterality: N/A;     Family History  Problem Relation Age of Onset  . Hypertension Mother   . Stroke Mother        died @ age 33  . Hypertension Father   . Diabetes Father   . Heart attack Father        died @ 60     Social History   Tobacco Use  . Smoking status: Former Smoker  Packs/day: 1.00    Years: 38.00    Pack years: 38.00    Types: Cigarettes    Start date: 72  . Smokeless tobacco: Never Used  Substance Use Topics  . Alcohol use: Not Currently    Alcohol/week: 1.0 standard drink    Types: 1 Cans of beer per week    Comment: previously drank but nothing in 1-2 yrs (08/2019).    With staff assistance, above reviewed with the patient/caregiver today.  ROS: As per HPI, otherwise no specific complaints on a limited and focused system review   No results found for this or any previous visit (from the past 72 hour(s)).   PHQ2/9: Depression screen [facility] 2/9 05/16/2020 03/08/2020 02/13/2020 11/13/2019 10/02/2019  Decreased Interest 0 0 1 0 0  Down, Depressed, Hopeless 0 0 1 0 0  PHQ - 2 Score 0 0 2 0 0  Altered sleeping - 1 1 3 3   Tired, decreased energy - 0 0 0 1  Change in appetite - 0 0 0 3  Feeling bad or failure about yourself  - 0 0 0 0  Trouble concentrating - 0 0 0 0  Moving slowly or fidgety/restless - 0 0 0 3  Suicidal thoughts - 0 0 0 0  PHQ-9 Score - 1 3 3 10   Difficult doing work/chores - Somewhat difficult Somewhat difficult Not difficult at all Not difficult at all  Some recent data might be hidden    PHQ-2/9 Result is neg  Fall Risk: Fall Risk  05/16/2020 03/08/2020 02/13/2020 11/13/2019 10/02/2019  Falls in the past year? 1 0 0 0 0  Number falls in past yr: 1 0 0 0 0  Injury with Fall? 1 0 0 0 0  Follow up Falls evaluation completed - Falls evaluation completed - -      Objective:   Vitals:   05/16/20 1111  BP: 130/82  Pulse: 83  Resp: 16  Temp: 97.8 F (36.6 C)  TempSrc: Oral  SpO2: 99%  Weight: 160 lb 4.8 oz (72.7 kg)  Height: 5' 6"  (1.676 m)    Body mass index is 25.87 kg/m.  Wt Readings from Last 3 Encounters:  05/16/20 160 lb 4.8 oz (72.7 kg)  03/08/20 160 lb 8 oz (72.8 kg)  02/13/20 156 lb (70.8 kg)    Physical Exam   NAD, masked,remains more attentive and interactive,seated in a wheelchair.  Started the visit with his wife present (in the bathroom), and he was very appropriate with conversation HEENT - [place]/AT, sclera anicteric, PERRL, EOMI without nystagmus, conjunctiva noninjected Neck - supple, no adenopathy, carotids 2+ and equal bilaterally without bruits Car - RRR without m/g/r Pulm- RR and effort normal at rest, CTA without wheeze or rales Abd - soft, NTdiffuselywithno focal epigastric discomfort,nondistended Back - no CVA tenderness Ext -no right lower extremity edema, trace more diffuse left lower extremity edema with no focal calf tenderness, no cords identified, no increased erythema or increased warmth of the calf area. No pain with ankle motion, and nontender palpating the medial and lateral malleoli and nontender in the syndesmotic region. Adequate strength of the ankle,  sensation intact to light touch in the distal lower extremity.  Neuro/psychiatricresponded appropriately to questions asked, Alert  Mild left-sided weakness persists, was able to grip hand, just slightly weaker on the left versus right, also slightly weaker in the left upper extremity.  In a wheelchair in the office.  Has a cane to  use when ambulates for support  Results for orders placed or performed in visit on 02/13/20  CBC with Differential/Platelet  Result Value Ref Range   WBC 4.9 3.8 - 10.8 Thousand/uL   RBC 4.33 4.20 - 5.80 Million/uL   Hemoglobin 12.3 (L) 13.2 - 17.1 g/dL   HCT 38.5 38 - 50 %   MCV 88.9 80.0 - 100.0 fL   MCH 28.4 27.0 - 33.0 pg   MCHC 31.9 (L) 32.0 - 36.0 g/dL   RDW 13.4 11.0 - 15.0 %   Platelets 242 140 - 400 Thousand/uL   MPV 10.1 7.5 - 12.5 fL   Neutro Abs 1,989 1,500 - 7,800 cells/uL   Lymphs Abs 2,225 850 - 3,900 cells/uL   Absolute Monocytes 475 200 - 950 cells/uL   Eosinophils Absolute 181 15 - 500 cells/uL   Basophils Absolute 29 0 - 200 cells/uL   Neutrophils Relative % 40.6 %   Total Lymphocyte 45.4 %   Monocytes Relative 9.7 %   Eosinophils Relative 3.7 %   Basophils Relative 0.6 %  COMPLETE METABOLIC PANEL WITH GFR  Result Value Ref Range   Glucose, Bld 465 (H) 65 - 99 mg/dL   BUN 36 (H) 7 - 25 mg/dL   Creat 1.78 (H) 0.70 - 1.25 mg/dL   GFR, Est Non African American 40 (L) > OR = 60 mL/min/1.76m   GFR, Est African American 46 (L) > OR = 60 mL/min/1.765m  BUN/Creatinine Ratio 20 6 - 22 (calc)   Sodium 135 135 - 146 mmol/L   Potassium 4.6 3.5 - 5.3 mmol/L   Chloride 99 98 - 110 mmol/L   CO2 28 20 - 32 mmol/L   Calcium 10.2 8.6 - 10.3 mg/dL   Total Protein 7.6 6.1 - 8.1 g/dL   Albumin 4.5 3.6 - 5.1 g/dL   Globulin 3.1 1.9 - 3.7 g/dL (calc)   AG Ratio 1.5 1.0 - 2.5 (calc)   Total Bilirubin 0.3 0.2 - 1.2 mg/dL   Alkaline phosphatase (APISO) 206 (H) 35 - 144 U/L   AST 19 10 - 35 U/L   ALT 37 9 - 46 U/L  Lipid panel  Result Value Ref Range   Cholesterol 139 <200 mg/dL   HDL 39 (L) > OR = 40 mg/dL   Triglycerides 267 (H) <150 mg/dL   LDL Cholesterol (Calc) 65 mg/dL (calc)   Total CHOL/HDL Ratio 3.6 <5.0 (calc)   Non-HDL Cholesterol (Calc) 100 <130 mg/dL (calc)  Hemoglobin A1c  Result Value Ref Range   Hgb A1c MFr Bld 13.3 (H) <5.7 % of total Hgb   Mean  Plasma Glucose 335 (calc)   eAG (mmol/L) 18.6 (calc)   Last labs reviewed    Assessment & Plan:    1. Hemiplegia and hemiparesis following cerebral infarction affecting left non-dominant side (HCC) Status post prior CVA Did feel that a return to physical therapy would be helpful presently, and a referral provided.  The patient and his wife both agreed and are very anxious to return to physical therapy. - Ambulatory referral to Physical Therapy  2. Lower extremity edema Do feel his lower extremity swelling is related to his prior CVA, and limited mobility with some venous stasis concerns as well.  Has not progressed over time in the recent past Do not feel there is any evidence of a DVT,  Will refer to physical therapy again as do feel would benefit from further PT/rehab.They were very much in agreement with this. Do feel a compression stocking may be helpful  on the left lower extremity, and was ordered today.  (Prescription provided for this) - Ambulatory referral to Physical Therapy  3. Gastroesophageal reflux disease without esophagitis Discussed that he is on a fairly high dose of the PPI, Protonix.  Can use twice daily as needed in the short-term, but recommended doing so for several days, and then returning to once daily and reassess. Discussed reflux precautions at length, and the importance of these in addition to a medicine, and did provide information in the AVS last visit on this. We will continue the Protonix presently, and if symptoms not improved with better reflux precautions in addition, will follow-up.  He had been followed by GI previously, and may need a follow-up visit with them again if symptoms not improving or more problematic over time.  May need a scoping procedure to help assess over time. We will check a CBC today  4. Type 2 diabetes mellitus with hyperglycemia, with long-term current use of insulin (HCC) Previously had emphasized the importance of getting  endocrinology involved, specially noting how high his blood sugar was on his last lab check. Asked Melissa to help contact him with endocrinology to get an appointment arranged, although endocrine input has not been obtained. Trying to again emphasize the importance of better blood sugar control The wife noted he was now taking 30 units of insulin, and asked her a couple times to clarify, has had trouble trying to figure out how that increase occurred with the recent documentation of visits. We will check an A1c today as well as a BMP and await those results. Asked that she check his blood sugars more regularly at home as noted above, at least every other day and try to get the numbers fasting in the morning. Await the lab results presently - Hemoglobin A0T - BASIC METABOLIC PANEL WITH GFR  5. Essential hypertension Blood pressure has remained well controlled. Continue his current medication regimen  6. Mixed hyperlipidemia Continuing the statin product presently Last lipids obtained in June, with LDL cholesterol at goal.  7. Stage 3a chronic kidney disease ([place]) Continuing to monitor. Recheck labs today. - BASIC METABOLIC PANEL WITH GFR  8. Recurrent strokes ([place])  - Ambulatory referral to Physical Therapy  9. Paroxysmal atrial fibrillation (HCC) Is anticoagulated now on Eliquis per cardiology recommendations. Continuing to monitor.  10. Anemia, unspecified type Has been stable in the recent past, check a CBC today  - CBC with Differential/Platelet  11. Monitoring for anticoagulant use No recent concerns for bleeding noted.  Await lab results presently, and tentatively scheduled a follow-up in approximately 4 months time, sooner as needed. He was requesting a pain medication, and discussed concerns with chronic pain medications like opioids, and NSAIDs which I do not think would be best presently.  Can try an extra strength Tylenol or arthritis strength Tylenol product  presently to help and assess his response.  Do feel getting physical therapy involved again will be helpful as well.   Towanda Malkin, MD 05/16/20 11:29 AM

## 2020-05-16 ENCOUNTER — Other Ambulatory Visit: Payer: Self-pay

## 2020-05-16 ENCOUNTER — Encounter: Payer: Self-pay | Admitting: Internal Medicine

## 2020-05-16 ENCOUNTER — Ambulatory Visit (INDEPENDENT_AMBULATORY_CARE_PROVIDER_SITE_OTHER): Payer: Medicaid Other | Admitting: Internal Medicine

## 2020-05-16 VITALS — BP 130/82 | HR 83 | Temp 97.8°F | Resp 16 | Ht 66.0 in | Wt 160.3 lb

## 2020-05-16 DIAGNOSIS — E1165 Type 2 diabetes mellitus with hyperglycemia: Secondary | ICD-10-CM | POA: Diagnosis not present

## 2020-05-16 DIAGNOSIS — N1831 Chronic kidney disease, stage 3a: Secondary | ICD-10-CM | POA: Diagnosis not present

## 2020-05-16 DIAGNOSIS — E782 Mixed hyperlipidemia: Secondary | ICD-10-CM | POA: Diagnosis not present

## 2020-05-16 DIAGNOSIS — Z794 Long term (current) use of insulin: Secondary | ICD-10-CM | POA: Diagnosis not present

## 2020-05-16 DIAGNOSIS — Z5181 Encounter for therapeutic drug level monitoring: Secondary | ICD-10-CM | POA: Diagnosis not present

## 2020-05-16 DIAGNOSIS — R600 Localized edema: Secondary | ICD-10-CM

## 2020-05-16 DIAGNOSIS — I10 Essential (primary) hypertension: Secondary | ICD-10-CM

## 2020-05-16 DIAGNOSIS — Z7901 Long term (current) use of anticoagulants: Secondary | ICD-10-CM

## 2020-05-16 DIAGNOSIS — I480 Paroxysmal atrial fibrillation: Secondary | ICD-10-CM

## 2020-05-16 DIAGNOSIS — D649 Anemia, unspecified: Secondary | ICD-10-CM

## 2020-05-16 DIAGNOSIS — I69354 Hemiplegia and hemiparesis following cerebral infarction affecting left non-dominant side: Secondary | ICD-10-CM

## 2020-05-16 DIAGNOSIS — K219 Gastro-esophageal reflux disease without esophagitis: Secondary | ICD-10-CM

## 2020-05-16 DIAGNOSIS — I639 Cerebral infarction, unspecified: Secondary | ICD-10-CM

## 2020-05-16 MED ORDER — MISC. DEVICES MISC: 0 refills | Status: DC

## 2020-05-16 NOTE — Patient Instructions (Signed)
Continue to use the protonix daily, and can use the protonix medicine twice daily as needed for reflux sx's,   Can try the arthritis strength tylenol (acetaminophen) as needed for pain  Put another referral in today for PT

## 2020-05-17 LAB — CBC WITH DIFFERENTIAL/PLATELET
Absolute Monocytes: 489 cells/uL (ref 200–950)
Basophils Absolute: 21 cells/uL (ref 0–200)
Basophils Relative: 0.4 %
Eosinophils Absolute: 88 cells/uL (ref 15–500)
Eosinophils Relative: 1.7 %
HCT: 37.8 % — ABNORMAL LOW (ref 38.5–50.0)
Hemoglobin: 12.8 g/dL — ABNORMAL LOW (ref 13.2–17.1)
Lymphs Abs: 2434 cells/uL (ref 850–3900)
MCH: 29.6 pg (ref 27.0–33.0)
MCHC: 33.9 g/dL (ref 32.0–36.0)
MCV: 87.5 fL (ref 80.0–100.0)
MPV: 10.6 fL (ref 7.5–12.5)
Monocytes Relative: 9.4 %
Neutro Abs: 2168 cells/uL (ref 1500–7800)
Neutrophils Relative %: 41.7 %
Platelets: 227 10*3/uL (ref 140–400)
RBC: 4.32 10*6/uL (ref 4.20–5.80)
RDW: 15 % (ref 11.0–15.0)
Total Lymphocyte: 46.8 %
WBC: 5.2 10*3/uL (ref 3.8–10.8)

## 2020-05-17 LAB — HEMOGLOBIN A1C
Hgb A1c MFr Bld: 13.1 % of total Hgb — ABNORMAL HIGH (ref <5.7)
Mean Plasma Glucose: 329 (calc)
eAG (mmol/L): 18.2 (calc)

## 2020-05-17 LAB — BASIC METABOLIC PANEL WITH GFR
BUN/Creatinine Ratio: 20 (calc) (ref 6–22)
BUN: 27 mg/dL — ABNORMAL HIGH (ref 7–25)
CO2: 22 mmol/L (ref 20–32)
Calcium: 9.7 mg/dL (ref 8.6–10.3)
Chloride: 105 mmol/L (ref 98–110)
Creat: 1.38 mg/dL — ABNORMAL HIGH (ref 0.70–1.25)
GFR, Est African American: 63 mL/min/{1.73_m2} (ref >=60)
GFR, Est Non African American: 54 mL/min/{1.73_m2} — ABNORMAL LOW (ref >=60)
Glucose, Bld: 331 mg/dL — ABNORMAL HIGH (ref 65–99)
Potassium: 4.4 mmol/L (ref 3.5–5.3)
Sodium: 137 mmol/L (ref 135–146)

## 2020-05-20 ENCOUNTER — Other Ambulatory Visit: Payer: Self-pay

## 2020-05-21 ENCOUNTER — Ambulatory Visit: Payer: Medicaid Other | Admitting: Gastroenterology

## 2020-06-03 ENCOUNTER — Encounter: Payer: Self-pay | Admitting: Podiatry

## 2020-06-03 ENCOUNTER — Ambulatory Visit (INDEPENDENT_AMBULATORY_CARE_PROVIDER_SITE_OTHER): Payer: Medicaid Other | Admitting: Podiatry

## 2020-06-03 DIAGNOSIS — D689 Coagulation defect, unspecified: Secondary | ICD-10-CM | POA: Diagnosis not present

## 2020-06-03 DIAGNOSIS — E1142 Type 2 diabetes mellitus with diabetic polyneuropathy: Secondary | ICD-10-CM

## 2020-06-03 DIAGNOSIS — B351 Tinea unguium: Secondary | ICD-10-CM | POA: Diagnosis not present

## 2020-06-03 DIAGNOSIS — M79675 Pain in left toe(s): Secondary | ICD-10-CM | POA: Diagnosis not present

## 2020-06-03 DIAGNOSIS — M79674 Pain in right toe(s): Secondary | ICD-10-CM | POA: Diagnosis not present

## 2020-06-03 NOTE — Progress Notes (Addendum)
This patient returns to my office for at risk foot care.  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 Patient is taking eliquis.  He is accompanied by his wife..This patient is unable to cut nails himself since the patient cannot reach his nails.These nails are painful walking and wearing shoes.  This patient presents for at risk foot care today.  General Appearance  Alert, conversant and in no acute stress.  Vascular  Dorsalis pedis and posterior tibial  pulses are palpable  bilaterally.  Capillary return is within normal limits  bilaterally. Temperature is within normal limits  bilaterally.  Neurologic  Senn-Weinstein monofilament wire test within normal limits  bilaterally. Muscle power within normal limits bilaterally.  Nails Thick disfigured discolored nails with subungual debris  from hallux to fifth toes bilaterally. No evidence of bacterial infection or drainage bilaterally.  Orthopedic  No limitations of motion  feet .  No crepitus or effusions noted.  No bony pathology or digital deformities noted.  Skin  normotropic skin with no porokeratosis noted bilaterally.  No signs of infections or ulcers noted.     Onychomycosis  Pain in right toes  Pain in left toes  Consent was obtained for treatment procedures.   Mechanical debridement of nails 1-5  bilaterally performed with a nail nipper.  Filed with dremel without incident. No infection or ulcer.     Return office visit   10 weeks                  Told patient to return for periodic foot care and evaluation due to potential at risk complications.   Helane Gunther DPM

## 2020-06-05 ENCOUNTER — Encounter: Payer: Self-pay | Admitting: Physical Therapy

## 2020-06-05 ENCOUNTER — Ambulatory Visit: Payer: Medicaid Other | Attending: Internal Medicine | Admitting: Physical Therapy

## 2020-06-05 ENCOUNTER — Other Ambulatory Visit: Payer: Self-pay

## 2020-06-05 DIAGNOSIS — I69354 Hemiplegia and hemiparesis following cerebral infarction affecting left non-dominant side: Secondary | ICD-10-CM | POA: Diagnosis not present

## 2020-06-05 DIAGNOSIS — R2689 Other abnormalities of gait and mobility: Secondary | ICD-10-CM | POA: Insufficient documentation

## 2020-06-05 DIAGNOSIS — M6281 Muscle weakness (generalized): Secondary | ICD-10-CM | POA: Insufficient documentation

## 2020-06-05 DIAGNOSIS — Z9181 History of falling: Secondary | ICD-10-CM | POA: Diagnosis not present

## 2020-06-05 DIAGNOSIS — R262 Difficulty in walking, not elsewhere classified: Secondary | ICD-10-CM | POA: Insufficient documentation

## 2020-06-05 DIAGNOSIS — R2681 Unsteadiness on feet: Secondary | ICD-10-CM | POA: Insufficient documentation

## 2020-06-05 DIAGNOSIS — R278 Other lack of coordination: Secondary | ICD-10-CM | POA: Insufficient documentation

## 2020-06-05 NOTE — Patient Instructions (Signed)
Access Code: [email or web address] URL: [email or web address] Date: 06/05/2020 Prepared by: Dorene Grebe  Exercises Standing March with Counter Support - 1 x daily - 7 x weekly - 2 sets - 15 reps Standing Hip Abduction with Counter Support - 1 x daily - 7 x weekly - 2 sets - 15 reps Sit to Stand with Counter Support - 1 x daily - 7 x weekly - 2 sets - 10 reps Supported Elbow Flexion Extension PROM - 1 x daily - 7 x weekly - 1 sets - 10 reps Supine Single Leg Ankle Pumps - 1 x daily - 7 x weekly - 3 sets - 10 reps

## 2020-06-05 NOTE — Therapy (Signed)
[facility] [address] [place], [place], [address] Phone: [phone]   Fax:  [phone]  Physical Therapy Evaluation  Patient Details  Name: Evan CRUTCHFIELD Sr. MRN: [id number] Date of Birth: April 20, 1956 Referring Provider (PT): Lebron Conners MD   Encounter Date: 06/05/2020   PT End of Session - 06/05/20 1527    Visit Number 1    Number of Visits 9    Date for PT Re-Evaluation 07/03/20    Authorization - Visit Number 1    Authorization - Number of Visits 10    PT Start Time 6962    PT Stop Time 9528    PT Time Calculation (min) 69 min    Equipment Utilized During Treatment Gait belt    Activity Tolerance Patient tolerated treatment well    Behavior During Therapy WFL for tasks assessed/performed           Past Medical History:  Diagnosis Date  . Carotid arterial disease ([place])    a. 08/2018 Carotid U/S: min-mod RICA atherosclerosis w/o hemodynamically significant stenosis. Nl LICA.  Marland Kitchen Chest pain    a. 08/2019 MV: EF 55%. No ischemia. Diaph attenuation noted.  . Diabetes 1.5, managed as type 2 ([place])   . Diastolic dysfunction    a. 08/2018 Echo: EF 65%. No rwma. Gr1 DD. Mild MR.  . Diastolic dysfunction    a. 08/2019 Echo: EF 55-60%, Gr1 DD. No rwma. Mild MR. RVSP 37.36mHg.  .Marland KitchenHypercholesterolemia   . Hypertension   . Left hemiparesis ([place]    a. Ambulated w/ Cane. Limited use of LUE.  .Marland KitchenPAF (paroxysmal atrial fibrillation) ([place]    a. 10/2019 Event Monitor: PAF; b. CHA2DS2VASc = 5-->Eliquis.  . Pain in both feet   . Poorly controlled diabetes mellitus ([place]    a. 04/2019 A1c 13.8.  .Marland KitchenRecurrent strokes ([place]    a. 10/2016 MRI/A: Acute 585mR thalamic infarct, ? subacute infarct of R corona radiata; b. 08/2017 MRI/A: Acute 109m74materal L thalamic infarct. Other more remote lacunar infarcts of thalami bilat. Small vessel dzs; c. 08/2018 MRI/A: Acute lacunar infarct of the post limb of R internal capsule; d. 08/2019 MRI  Acute CVA of L paramedian pons adn R cerebellar hemisphere.  . Tobacco abuse     Past Surgical History:  Procedure Laterality Date  . ESOPHAGOGASTRODUODENOSCOPY (EGD) WITH PROPOFOL N/A 04/26/2019   Procedure: ESOPHAGOGASTRODUODENOSCOPY (EGD) WITH PROPOFOL;  Surgeon: VanLin LandsmanD;  [facility];  Laterality: N/A;    There were no vitals filed for this visit.    Subjective Assessment - 06/05/20 1406    Subjective Pt is a pleasant [age]ar old male referred to PT for recurrent strokes. Most recent stroke was L pontine stroke on 09/12/2019. hx of previous stroke as jan 2020 affecting R BG. Pt has received inpatient rehab and also outpatient services that appeared per documentation to end on November 28, 2019 due to medicaid visit limit. [place] to a single level home with 3 STE with a hand rail on both sides. Pt reports tub shower combo with big lip to step over and difficulty with standing off of the toilet. Pt reports a fall 3 weeks ago outside getting out of the car. No serious inury from fall. Pt reports on planning to see endocrinologist for follow up due to elevated blood sugar. Pt does not wear his AFO or swedish knee  cage due to reporting that it "hyperextends my knee".  Pt reports biggest goal is to improve walking and his LUE function.    Pertinent History Pertinent history and PMH: HTN, uncontrolled DM, HLD, R basal ganglia stroke on 08/2018 and L pointine and R cerebellar stroke 09/07/19, sinus bradycardia, peripheral neuropat    Patient Stated Goals to get back to fishing, walking better, balance.    Currently in Pain? No/denies    Pain Score 0-No pain    Pain Onset More than a month ago               OBJECTIVE MUSCULOSKELETAL: Tremor: Absent Bulk: Normal Tone: Noted tone in L bicep and wrist flexors.  Posture Mild, slumped and kyphotic posture with forward head. No significant L  shoudler subluxation noted on LUE. Appears Mairany Bruno symmetrical.  Gait No use of AFO or swedish knee cage for LLE although pt owns them. Decreased BOS overall and step length on RLE due to decreased stance time on LLE from residual weakness. Use of hip hike on L to clear L foot during swing phase due to ant hip weakness with L foot in inversion. Amb with SPC.  Strength R/L 5/3- Hip flexion 5/3 Hip abduction (seated) 5/3 Hip adduction 5/4 Knee extension 5/3+ Knee flexion 5/2+ Ankle Plantarflexion 5/3 Ankle Dorsiflexion 5/3- Great toe extension   NEUROLOGICAL: Mental Status Noted cognitive deficits at baseline from recurrent strokes. Patient's fund of knowledge is within normal limits for educational level.  Cranial Nerves Visual acuity and visual fields are intact  Extraocular muscles are intact. B smooth pursuit and peripheral vision intact.   Sensation Grossly intact to light touch bilateral UEs/LEs as determined by testing dermatomes C2-T2/L2-S2 respectively Proprioception and hot/cold testing deferred on this date  Coordination/Cerebellar Finger to Nose: WNL on RLE. Unable to assess LUE due to AROM deficits Heel to Shin: WNL on RLE. Impaired on LLE Toe tapping: Unable to toe tap feet in sync.  FUNCTIONAL OUTCOME MEASURES   Results Comments  BERG 39/56 Fall risk, in need of intervention  DGI 10/24 Falls risk for community amb  5xSTS 14.48 seconds Impaired LE strength  10 Meter Gait Speed Normal walking speed in m/s = 0.34 m/s Below normative values for full community ambulation                 POSTURAL CONTROL TESTS  Modified Clinical Test of Sensory Interaction for Balance (CTSIB):  CONDITION TIME STRATEGY SWAY  Eyes open, firm surface 30 seconds ankle None  Eyes closed, firm surface 30 seconds ankle None  Eyes open, foam surface 30 seconds ankle None  Eyes closed, foam surface 30 seconds ankle AP   FOTO: to be assessed next session L elbow PROM: Lacking 40  deg from full elbow extension (180 deg is normal)     TREATMENT  Pt educated and performed single set of all exercises listed below. Spouse educated for pt not to perform standing and STS exercise without her supervision for safety purposes.   STS at counter top  Standing hip abduction  Standing marches   Passive L elbow stretch   Seated/supine ankle DF/PF   Objective measurements completed on examination: See above findings.     PT Education - 06/05/20 1526    Education Details Form/technique with exercise.    Person(s) Educated Patient    Methods Explanation;Demonstration;Tactile cues;Verbal cues;Handout    Comprehension Verbalized understanding;Returned demonstration;Verbal cues required;Tactile cues required;Need further instruction  PT Short Term Goals - 11/22/19 1718      PT SHORT TERM GOAL #1   Title Patient will be independent with HEP for improved therapeutic gains and self-management.    Baseline 2/10: give next session 4/7: HEP compliant    Time 4    Period Weeks    Status Partially Met    Target Date 10/25/19             PT Long Term Goals - 06/05/20 1540      PT LONG TERM GOAL #1   Title Pt will increase Berg Balance to 50/56 to demonstrate clinically significant improvement in balance decreasing pt's risk for falls during functional activities.    Baseline 10/20: 39/56    Time 4    Period Weeks    Status New    Target Date 07/03/20      PT LONG TERM GOAL #2   Title Pt will improve 10 m gait speed to > 0.5 m/s  with LRAD to improve safe community ambulation.    Baseline 10/20: 0.34 m/s with SPC and SBA+1    Time 4    Period Weeks    Status New    Target Date 07/03/20      PT LONG TERM GOAL #3   Title Pt will improve 5xSTS to < 12 sec with no RUE support to demonstrate clinically significant improvement in BLE strength to improve STS t/f's like standing from the toilet.    Baseline 10/20: 14.48 sec with RUE support on hand rail.     Time 4    Period Weeks    Status New    Target Date 07/03/20      PT LONG TERM GOAL #4   Title Pt will improve DGI by a minimum of 5 points to demonstrate clinically significant improvement and safety with community ambulation tasks to decrease risk of falls.    Baseline 10/20: 10/22    Time 4    Period Weeks    Status New    Target Date 07/03/20      PT LONG TERM GOAL #5   Title Pt will improve FOTO score to target amount to demonstrate perceived improvements in ability to complete ADL's.    Baseline 10/20: PErform next session.    Time 4    Period Weeks    Status New    Target Date 07/03/20               Plan - 06/05/20 1528    Clinical Impression Statement Pt is a pleasant 64 y.o. male referred to PT after recurrent strokes, latest stroke in 2021 affecting R cerebellum. At baseline pt has L hemiplegia in LLE/LUE with noted tone in L bicep. Visual fields in tact with smooth pursuits bilaterally and peripheral vision intact bilaterally. Significant limitations in strength on LUE/LLE limiting pt's ability to safely stand and amb however RUE/RLE strength grossly 5/5 MMT. Passively therapist is able to extend L elbow to where pt is lacking 40 deg from terminal extension. Distal digits able to fully extend passively as well with pt's ability to partially extend digits 1-5 but unable to achieve full extension. Pt scored 5xSTS in 14.48 sec requiring RUE assist placing pt at increased risk of falls for his age. On Berg balance scale pt scored 39/56 and DGI of 10/22 placing pt at significant risk of falls. Pt amb at baseline with cane so score for the DGI is significantly affected due to use of an  AD but still demonstrates deficits for safe community ambulation tasks placing pt at risk of falls. Pt's normal walking speed 10 m gait was recorded at 0.34 m/s making pt a limited community ambulator and at risk for falls. Due to these impairments, pt is at a significant increased risk of falls and  limited in independently walking and performing ADL's independently. Pt can benefit from skilled PT treatment to address these impairments so pt can decrease risk of falls and improve overall functional mobility.    Personal Factors and Comorbidities Age;Comorbidity 3+;Education;Finances;Past/Current Experience;Fitness;Transportation    Comorbidities HTN, uncontrolled DM, HLD, R basal ganglia stroke on 08/2018 and L pointine and R cerebellar stroke 09/07/19, sinus bradycardia, peripheral neuropathy    Examination-Activity Limitations Bathing;Bed Mobility;Dressing;Transfers;Squat;Lift;Locomotion Level;Stairs;Reach Overhead;Carry;Stand;Sit    Examination-Participation Restrictions Community Activity;Interpersonal Relationship;Yard Work    Merchant navy officer Evolving/Moderate complexity    Clinical Decision Making Moderate    Rehab Potential Fair    PT Frequency 2x / week    PT Duration 4 weeks    PT Treatment/Interventions ADLs/Self Care Home Management;Aquatic Therapy;Electrical Stimulation;Moist Heat;DME Instruction;Gait training;Stair training;Functional mobility training;Therapeutic activities;Therapeutic exercise;Balance training;Neuromuscular re-education;Patient/family education;Orthotic Fit/Training;Dry needling;Manual techniques    PT Next Visit Plan Reassess HEP. Low level balance    PT Home Exercise Plan HEP: standing hip marches/abd, ankle DF/PF in sitting/supine, passive L elbow stretch for tone    Consulted and Agree with Plan of Care Patient    Family Member Consulted wife           Patient will benefit from skilled therapeutic intervention in order to improve the following deficits and impairments:  Abnormal gait, Decreased coordination, Decreased range of motion, Difficulty walking, Impaired tone, Decreased endurance, Impaired UE functional use, Decreased activity tolerance, Impaired perceived functional ability, Decreased balance, Impaired flexibility, Decreased  cognition, Decreased mobility, Decreased strength, Increased edema, Postural dysfunction  Visit Diagnosis: Muscle weakness (generalized)  Hemiplegia and hemiparesis following cerebral infarction affecting left non-dominant side (HCC)  Other abnormalities of gait and mobility  At risk for falls  Difficulty in walking, not elsewhere classified     Problem List Patient Active Problem List   Diagnosis Date Noted  . Lower extremity edema 05/16/2020  . Paroxysmal atrial fibrillation ([place]) 10/26/2019  . Chest pain of uncertain etiology 74/07/8785  . History of CVA (cerebrovascular accident) 09/05/2019  . Hyperglycemia 09/05/2019  . Anemia 09/05/2019  . AKI (acute kidney injury) ([place]) 09/05/2019  . Atypical pneumonia 09/05/2019  . Coagulation disorder ([place]) 06/12/2019  . Gastroesophageal reflux disease   . Hemiplegia and hemiparesis following cerebral infarction affecting left non-dominant side ([place]) 11/25/2018  . 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 RBBB 10/15/2018  . Sinus bradycardia 10/15/2018  . Dysphagia, post-stroke   . Neuropathic pain   . Lacunar infarct, acute ([place]) 09/06/2018  . TIA (transient ischemic attack) 09/01/2018  . Microalbuminuria 07/29/2018  . Left leg pain 01/13/2018  . Hyperlipidemia LDL goal <70 02/11/2017  . Recurrent strokes ([place]) 11/10/2016  . Essential hypertension 11/10/2016  . Diabetes ([place]) 11/10/2016  . Tobacco abuse 08/28/2013   Pura Spice, PT, DPT # [id number] SPT 06/06/2020, 11:49 AM  [facility] [address] [place], [place], [address] Phone: [phone]   Fax:  [phone]  Name: Evan BAYLESS Sr. MRN: [id number] Date of Birth: 1956/04/08

## 2020-06-11 ENCOUNTER — Encounter: Payer: Self-pay | Admitting: Physical Therapy

## 2020-06-11 ENCOUNTER — Ambulatory Visit: Payer: Medicaid Other

## 2020-06-11 ENCOUNTER — Other Ambulatory Visit: Payer: Self-pay

## 2020-06-11 DIAGNOSIS — R278 Other lack of coordination: Secondary | ICD-10-CM

## 2020-06-11 DIAGNOSIS — Z9181 History of falling: Secondary | ICD-10-CM

## 2020-06-11 DIAGNOSIS — R262 Difficulty in walking, not elsewhere classified: Secondary | ICD-10-CM | POA: Diagnosis not present

## 2020-06-11 DIAGNOSIS — R2681 Unsteadiness on feet: Secondary | ICD-10-CM | POA: Diagnosis not present

## 2020-06-11 DIAGNOSIS — R2689 Other abnormalities of gait and mobility: Secondary | ICD-10-CM

## 2020-06-11 DIAGNOSIS — M6281 Muscle weakness (generalized): Secondary | ICD-10-CM | POA: Diagnosis not present

## 2020-06-11 DIAGNOSIS — I69354 Hemiplegia and hemiparesis following cerebral infarction affecting left non-dominant side: Secondary | ICD-10-CM

## 2020-06-11 NOTE — Therapy (Signed)
[facility] [address] [place], [place], [address] Phone: [phone]   Fax:  [phone]  Physical Therapy Treatment  Patient Details  Name: Evan LONIGRO Sr. MRN: [id number] Date of Birth: 02-Oct-1955 Referring Provider (PT): Lebron Conners MD   Encounter Date: 06/11/2020   PT End of Session - 06/11/20 1720    Visit Number 2    Number of Visits 9    Date for PT Re-Evaluation 07/03/20    Authorization - Visit Number 2    Authorization - Number of Visits 10    PT Start Time 9373    PT Stop Time 1518    PT Time Calculation (min) 45 min    Equipment Utilized During Treatment Gait belt    Activity Tolerance Patient tolerated treatment well    Behavior During Therapy WFL for tasks assessed/performed           Past Medical History:  Diagnosis Date  . Carotid arterial disease ([place])    a. 08/2018 Carotid U/S: min-mod RICA atherosclerosis w/o hemodynamically significant stenosis. Nl LICA.  Marland Kitchen Chest pain    a. 08/2019 MV: EF 55%. No ischemia. Diaph attenuation noted.  . Diabetes 1.5, managed as type 2 ([place])   . Diastolic dysfunction    a. 08/2018 Echo: EF 65%. No rwma. Gr1 DD. Mild MR.  . Diastolic dysfunction    a. 08/2019 Echo: EF 55-60%, Gr1 DD. No rwma. Mild MR. RVSP 37.472mHg.  .Marland KitchenHypercholesterolemia   . Hypertension   . Left hemiparesis ([place]    a. Ambulated w/ Cane. Limited use of LUE.  .Marland KitchenPAF (paroxysmal atrial fibrillation) ([place]    a. 10/2019 Event Monitor: PAF; b. CHA2DS2VASc = 5-->Eliquis.  . Pain in both feet   . Poorly controlled diabetes mellitus ([place]    a. 04/2019 A1c 13.8.  .Marland KitchenRecurrent strokes (HH[place]    a. 10/2016 MRI/A: Acute 555mR thalamic infarct, ? subacute infarct of R corona radiata; b. 08/2017 MRI/A: Acute 72m60materal L thalamic infarct. Other more remote lacunar infarcts of thalami bilat. Small vessel dzs; c. 08/2018 MRI/A: Acute lacunar infarct of the post limb of R internal capsule; d. 08/2019 MRI  Acute CVA of L paramedian pons adn R cerebellar hemisphere.  . Tobacco abuse     Past Surgical History:  Procedure Laterality Date  . ESOPHAGOGASTRODUODENOSCOPY (EGD) WITH PROPOFOL N/A 04/26/2019   Procedure: ESOPHAGOGASTRODUODENOSCOPY (EGD) WITH PROPOFOL;  Surgeon: VanLin LandsmanD;  [facility];  Laterality: N/A;    There were no vitals filed for this visit.   Subjective Assessment - 06/11/20 1718    Subjective Pt ambulated into clinic with AFO donned. Reports performing HEP and no falls. Sucessfully moved into new home with no difficulties.No pain.    Pertinent History Pertinent history and PMH: HTN, uncontrolled DM, HLD, R basal ganglia stroke on 08/2018 and L pointine and R cerebellar stroke 09/07/19, sinus bradycardia, peripheral neuropat    Patient Stated Goals to get back to fishing, walking better, balance.    Currently in Pain? No/denies    Pain Score 0-No pain    Pain Onset More than a month ago           There.ex:   L passive bicep stretch to reduce spasticity for 10 min in sitting. Addition of elbow extension with pronation for added stretch: 5x30 sec   Alternating Step ups on 3" step in // bars with BUE support. CGA+1. Noted L  knee extension thrust,  Regressed to seated LAQ's with 5 bs AW's" 1x10. Decreased eccentric control. Dropped weight to 3 lbs for 2x10 with improved eccentric control and good form/technique.     Neuro Re-Ed:   In //: Standing weight shifts feet apart with forward step x12 reps, focused on shift to LLE with CGA and tactile cues. Standing weight shifts laterally with lateral step on LLE x12 reps with CGa and tactile cueing   TUG with LSC and AFO: 36.37 sec  TUG with LSC and no AFO: 34.39 sec   Assessment of gait in clinic with AFO donned and AFO doffed and LSC in RUE. CGA+1 throughout with verbal and tactile cues to promote equal WB'ing through R and L LE's with AFO donned. Increased weight shift to  RUE/RLE. Noted L knee extension thrust with AFO on indicative of quad weakness. With AFO doffed, increased falls risk with difficulty clearing L toes off ground and limited knee flexion in swing phase. Maintains knee extension in swing phase so pt does not have extension thrust. Pt educated on importance of AFO for ambulation to reduce risk of falls.   PT Education - 06/11/20 1719    Education Details Exercise technique. Use of AFO to reduce falls risk.    Person(s) Educated Patient    Methods Explanation;Demonstration;Tactile cues;Verbal cues    Comprehension Verbalized understanding;Returned demonstration            PT Short Term Goals - 11/22/19 1718      PT SHORT TERM GOAL #1   Title Patient will be independent with HEP for improved therapeutic gains and self-management.    Baseline 2/10: give next session 4/7: HEP compliant    Time 4    Period Weeks    Status Partially Met    Target Date 10/25/19             PT Long Term Goals - 06/11/20 1729      PT LONG TERM GOAL #1   Title Pt will increase Berg Balance to 50/56 to demonstrate clinically significant improvement in balance decreasing pt's risk for falls during functional activities.    Baseline 10/20: 39/56    Time 4    Period Weeks    Status New      PT LONG TERM GOAL #2   Title Pt will improve 10 m gait speed to > 0.5 m/s  with LRAD to improve safe community ambulation.    Baseline 10/20: 0.34 m/s with SPC and SBA+1    Time 4    Period Weeks    Status New      PT LONG TERM GOAL #3   Title Pt will improve 5xSTS to < 12 sec with no RUE support to demonstrate clinically significant improvement in BLE strength to improve STS t/f's like standing from the toilet.    Baseline 10/20: 14.48 sec with RUE support on hand rail.    Time 4    Period Weeks    Status New      PT LONG TERM GOAL #4   Title Pt will improve DGI by a minimum of 5 points to demonstrate clinically significant improvement and safety with community  ambulation tasks to decrease risk of falls.    Baseline 10/20: 10/22    Time 4    Period Weeks    Status New      PT LONG TERM GOAL #5   Title Pt will improve FOTO score to target amount to demonstrate perceived improvements  in ability to complete ADL's.    Baseline 10/20: PErform next session.    Time 4    Period Weeks    Status New      Additional Long Term Goals   Additional Long Term Goals Yes      PT LONG TERM GOAL #6   Title Pt will improve TUG time with donned AFO and LSC by 3 sec to display clinically significant improvement in safe ambulation with AD.    Baseline 10/26: 36.37 sec    Time 4    Period Weeks    Status New    Target Date 07/03/20                 Plan - 06/11/20 1721    Clinical Impression Statement Assessed pt's TUG with and without AFO and lofstrand crutch on R side. TUG with AFO: 36.37 sec, TUG without 34.39 sec. Although quicker gait without AFO, pt without brace has significant difficulty clearing toes during swing phase of gait and is at increased risk of falls. Noted extension thrust with gait with AFO on indicative of quad weakness. Performed 3" step ups with continued extension thrust on LLE so regressed to open chain knee extensions to eliinate hyperextended forces on knee joint. Pt can continue to benefit from further skilled PT treatment to improve functional mobility and to redcue risk of falls.    Personal Factors and Comorbidities Age;Comorbidity 3+;Education;Finances;Past/Current Experience;Fitness;Transportation    Comorbidities HTN, uncontrolled DM, HLD, R basal ganglia stroke on 08/2018 and L pointine and R cerebellar stroke 09/07/19, sinus bradycardia, peripheral neuropathy    Examination-Activity Limitations Bathing;Bed Mobility;Dressing;Transfers;Squat;Lift;Locomotion Level;Stairs;Reach Overhead;Carry;Stand;Sit    Examination-Participation Restrictions Community Activity;Interpersonal Relationship;Yard Work    Multimedia programmer Evolving/Moderate complexity    Clinical Decision Making Moderate    Rehab Potential Fair    PT Frequency 2x / week    PT Duration 4 weeks    PT Treatment/Interventions ADLs/Self Care Home Management;Aquatic Therapy;Electrical Stimulation;Moist Heat;DME Instruction;Gait training;Stair training;Functional mobility training;Therapeutic activities;Therapeutic exercise;Balance training;Neuromuscular re-education;Patient/family education;Orthotic Fit/Training;Dry needling;Manual techniques    PT Next Visit Plan BLE strength and balance    PT Home Exercise Plan HEP: standing hip marches/abd, ankle DF/PF in sitting/supine, passive L elbow stretch for tone    Consulted and Agree with Plan of Care Patient    Family Member Consulted wife           Patient will benefit from skilled therapeutic intervention in order to improve the following deficits and impairments:  Abnormal gait, Decreased coordination, Decreased range of motion, Difficulty walking, Impaired tone, Decreased endurance, Impaired UE functional use, Decreased activity tolerance, Impaired perceived functional ability, Decreased balance, Impaired flexibility, Decreased cognition, Decreased mobility, Decreased strength, Increased edema, Postural dysfunction  Visit Diagnosis: Muscle weakness (generalized)  Hemiplegia and hemiparesis following cerebral infarction affecting left non-dominant side (HCC)  Other abnormalities of gait and mobility  At risk for falls  Difficulty in walking, not elsewhere classified  Other lack of coordination     Problem List Patient Active Problem List   Diagnosis Date Noted  . Lower extremity edema 05/16/2020  . Paroxysmal atrial fibrillation ([place]) 10/26/2019  . Chest pain of uncertain etiology 69/62/9528  . History of CVA (cerebrovascular accident) 09/05/2019  . Hyperglycemia 09/05/2019  . Anemia 09/05/2019  . AKI (acute kidney injury) ([place]) 09/05/2019  . Atypical pneumonia 09/05/2019  .  Coagulation disorder ([place]) 06/12/2019  . Gastroesophageal reflux disease   . Hemiplegia and hemiparesis following cerebral infarction affecting left non-dominant  side ([place]) 11/25/2018  . 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 RBBB 10/15/2018  . Sinus bradycardia 10/15/2018  . Dysphagia, post-stroke   . Neuropathic pain   . Lacunar infarct, acute ([place]) 09/06/2018  . TIA (transient ischemic attack) 09/01/2018  . Microalbuminuria 07/29/2018  . Left leg pain 01/13/2018  . Hyperlipidemia LDL goal <70 02/11/2017  . Recurrent strokes ([place]) 11/10/2016  . Essential hypertension 11/10/2016  . Diabetes ([place]) 11/10/2016  . Tobacco abuse 08/28/2013    Larna Daughters, SPT 06/11/2020, 5:33 PM  [facility] [address] [place], [place], [address] Phone: [phone]   Fax:  [phone]  Name: Evan Cones Sr. MRN: [id number] Date of Birth: Mar 05, 1956

## 2020-06-13 ENCOUNTER — Encounter: Payer: Self-pay | Admitting: Physical Therapy

## 2020-06-13 ENCOUNTER — Ambulatory Visit: Payer: Medicaid Other

## 2020-06-13 ENCOUNTER — Other Ambulatory Visit: Payer: Self-pay

## 2020-06-13 DIAGNOSIS — E1165 Type 2 diabetes mellitus with hyperglycemia: Secondary | ICD-10-CM | POA: Diagnosis not present

## 2020-06-13 DIAGNOSIS — R2689 Other abnormalities of gait and mobility: Secondary | ICD-10-CM | POA: Diagnosis not present

## 2020-06-13 DIAGNOSIS — R262 Difficulty in walking, not elsewhere classified: Secondary | ICD-10-CM | POA: Diagnosis not present

## 2020-06-13 DIAGNOSIS — M6281 Muscle weakness (generalized): Secondary | ICD-10-CM

## 2020-06-13 DIAGNOSIS — Z9181 History of falling: Secondary | ICD-10-CM

## 2020-06-13 DIAGNOSIS — I69354 Hemiplegia and hemiparesis following cerebral infarction affecting left non-dominant side: Secondary | ICD-10-CM | POA: Diagnosis not present

## 2020-06-13 DIAGNOSIS — R2681 Unsteadiness on feet: Secondary | ICD-10-CM | POA: Diagnosis not present

## 2020-06-13 DIAGNOSIS — R278 Other lack of coordination: Secondary | ICD-10-CM

## 2020-06-13 NOTE — Therapy (Signed)
[facility] [address]. [place], [place], [address] Phone: [phone]   Fax:  [phone]  Physical Therapy Treatment  Patient Details  Name: Evan ROHL Sr. MRN: [id number] Date of Birth: 02/12/1956 Referring Provider (PT): Lebron Conners MD   Encounter Date: 06/13/2020   PT End of Session - 06/13/20 1513    Visit Number 3    Number of Visits 9    Date for PT Re-Evaluation 07/03/20    Authorization - Visit Number 3    Authorization - Number of Visits 10    PT Start Time 8546    PT Stop Time 1503    PT Time Calculation (min) 47 min    Equipment Utilized During Treatment Gait belt    Activity Tolerance Patient tolerated treatment well    Behavior During Therapy WFL for tasks assessed/performed           Past Medical History:  Diagnosis Date  . Carotid arterial disease ([place])    a. 08/2018 Carotid U/S: min-mod RICA atherosclerosis w/o hemodynamically significant stenosis. Nl LICA.  Marland Kitchen Chest pain    a. 08/2019 MV: EF 55%. No ischemia. Diaph attenuation noted.  . Diabetes 1.5, managed as type 2 ([place])   . Diastolic dysfunction    a. 08/2018 Echo: EF 65%. No rwma. Gr1 DD. Mild MR.  . Diastolic dysfunction    a. 08/2019 Echo: EF 55-60%, Gr1 DD. No rwma. Mild MR. RVSP 37.11mHg.  .Marland KitchenHypercholesterolemia   . Hypertension   . Left hemiparesis ([place]    a. Ambulated w/ Cane. Limited use of LUE.  .Marland KitchenPAF (paroxysmal atrial fibrillation) ([place]    a. 10/2019 Event Monitor: PAF; b. CHA2DS2VASc = 5-->Eliquis.  . Pain in both feet   . Poorly controlled diabetes mellitus ([place]    a. 04/2019 A1c 13.8.  .Marland KitchenRecurrent strokes ([place]    a. 10/2016 MRI/A: Acute 5104mR thalamic infarct, ? subacute infarct of R corona radiata; b. 08/2017 MRI/A: Acute 4m31materal L thalamic infarct. Other more remote lacunar infarcts of thalami bilat. Small vessel dzs; c. 08/2018 MRI/A: Acute lacunar infarct of the post limb of R internal capsule; d. 08/2019 MRI  Acute CVA of L paramedian pons adn R cerebellar hemisphere.  . Tobacco abuse     Past Surgical History:  Procedure Laterality Date  . ESOPHAGOGASTRODUODENOSCOPY (EGD) WITH PROPOFOL N/A 04/26/2019   Procedure: ESOPHAGOGASTRODUODENOSCOPY (EGD) WITH PROPOFOL;  Surgeon: VanLin LandsmanD;  [facility];  Laterality: N/A;    There were no vitals filed for this visit.   Subjective Assessment - 06/13/20 1512    Subjective Pt reports consistently wearing his AFO and using SPC. No falls reported and states he has no issues with safe mobility in his new home. No pain.    Pertinent History Pertinent history and PMH: HTN, uncontrolled DM, HLD, R basal ganglia stroke on 08/2018 and L pointine and R cerebellar stroke 09/07/19, sinus bradycardia, peripheral neuropat    Patient Stated Goals to get back to fishing, walking better, balance.    Currently in Pain? No/denies    Pain Score 0-No pain    Pain Onset More than a month ago           There.ex:   LLE step ups on 6" step with RUE on handrail of stair for L quad strength: 2x8 reps. CGA+1. Verbal cues for foot placement and form/technique.  Seated LAQ's: 5 lbs AW. 1x6. Noted post  lean and difficulty extending knee. Dropped to 3 lbs AW's 1x12. Improved ROM for L knee extension. Mod tactile cues on shoulder to reduce post trunk lean as compensation.   Seated LAQ with no resistance with focus on eccentric control: 1x10. Good eccentric control with noticeable fatigue as pt progressed to 10 reps.   Neuro Re-Ed:   STS and Stand to sit transfers in standard height chair to blue mat table with LSC on RUE. Pt required verbal cuing for 75% of time for safe hand placement and sequencing of LSC along with safe turning before sitting. CGA+1 throughout.  Amb along floor ladder with quad cane: x8. Initially required verbal cues for safe sequencing of quad cane with LLE with step through pattern 70% of the time to reduce  risk of falls. Final 4 rounds pt displayed good carryover with quad cane sequencing. CGA+1.  Good carryover amb 1 lap around gym with quad cane with min verbal cues to maintain quad cane closer to body for improved balance.    PT Education - 06/13/20 1512    Education Details Gait mechanics, sequencing quad cane with gait.    Person(s) Educated Patient    Methods Explanation;Demonstration;Tactile cues;Verbal cues    Comprehension Verbalized understanding;Returned demonstration            PT Short Term Goals - 11/22/19 1718      PT SHORT TERM GOAL #1   Title Patient will be independent with HEP for improved therapeutic gains and self-management.    Baseline 2/10: give next session 4/7: HEP compliant    Time 4    Period Weeks    Status Partially Met    Target Date 10/25/19             PT Long Term Goals - 06/11/20 1729      PT LONG TERM GOAL #1   Title Pt will increase Berg Balance to 50/56 to demonstrate clinically significant improvement in balance decreasing pt's risk for falls during functional activities.    Baseline 10/20: 39/56    Time 4    Period Weeks    Status New      PT LONG TERM GOAL #2   Title Pt will improve 10 m gait speed to > 0.5 m/s  with LRAD to improve safe community ambulation.    Baseline 10/20: 0.34 m/s with SPC and SBA+1    Time 4    Period Weeks    Status New      PT LONG TERM GOAL #3   Title Pt will improve 5xSTS to < 12 sec with no RUE support to demonstrate clinically significant improvement in BLE strength to improve STS t/f's like standing from the toilet.    Baseline 10/20: 14.48 sec with RUE support on hand rail.    Time 4    Period Weeks    Status New      PT LONG TERM GOAL #4   Title Pt will improve DGI by a minimum of 5 points to demonstrate clinically significant improvement and safety with community ambulation tasks to decrease risk of falls.    Baseline 10/20: 10/22    Time 4    Period Weeks    Status New      PT LONG TERM  GOAL #5   Title Pt will improve FOTO score to target amount to demonstrate perceived improvements in ability to complete ADL's.    Baseline 10/20: PErform next session.    Time 4  Period Weeks    Status New      Additional Long Term Goals   Additional Long Term Goals Yes      PT LONG TERM GOAL #6   Title Pt will improve TUG time with donned AFO and LSC by 3 sec to display clinically significant improvement in safe ambulation with AD.    Baseline 10/26: 36.37 sec    Time 4    Period Weeks    Status New    Target Date 07/03/20                 Plan - 06/13/20 1514    Clinical Impression Statement Pt required mod verbal cuing for safe STS transfer with LSC. Difficulty using LUE to remove RUE cuff off of fore arm from [facility] due to LUE spasticity hindering pt to safely transfer with [facility]. Gait mechanics and sequencing performed with quad cane with mod verbal cues to progress SPC with LLE with equal step lengths with good carryover after. Pt educated on safe use of stairs without AD and utilizing spouse to hold AD so pt can use railing. Pt can continue to benefit from skilled PT treatment to improve functional mobility and reduce risk of falls.    Personal Factors and Comorbidities Age;Comorbidity 3+;Education;Finances;Past/Current Experience;Fitness;Transportation    Comorbidities HTN, uncontrolled DM, HLD, R basal ganglia stroke on 08/2018 and L pointine and R cerebellar stroke 09/07/19, sinus bradycardia, peripheral neuropathy    Examination-Activity Limitations Bathing;Bed Mobility;Dressing;Transfers;Squat;Lift;Locomotion Level;Stairs;Reach Overhead;Carry;Stand;Sit    Examination-Participation Restrictions Community Activity;Interpersonal Relationship;Yard Work    Merchant [employer] officer Evolving/Moderate complexity    Clinical Decision Making Moderate    Rehab Potential Fair    PT Frequency 2x / week    PT Duration 4 weeks    PT Treatment/Interventions ADLs/Self Care Home  Management;Aquatic Therapy;Electrical Stimulation;Moist Heat;DME Instruction;Gait training;Stair training;Functional mobility training;Therapeutic activities;Therapeutic exercise;Balance training;Neuromuscular re-education;Patient/family education;Orthotic Fit/Training;Dry needling;Manual techniques    PT Next Visit Plan Gait mechanics with quad cane.    PT Home Exercise Plan HEP: standing hip marches/abd, ankle DF/PF in sitting/supine, passive L elbow stretch for tone    Consulted and Agree with Plan of Care Patient    Family Member Consulted wife           Patient will benefit from skilled therapeutic intervention in order to improve the following deficits and impairments:  Abnormal gait, Decreased coordination, Decreased range of motion, Difficulty walking, Impaired tone, Decreased endurance, Impaired UE functional use, Decreased activity tolerance, Impaired perceived functional ability, Decreased balance, Impaired flexibility, Decreased cognition, Decreased mobility, Decreased strength, Increased edema, Postural dysfunction  Visit Diagnosis: Muscle weakness (generalized)  Hemiplegia and hemiparesis following cerebral infarction affecting left non-dominant side (HCC)  Other abnormalities of gait and mobility  At risk for falls  Difficulty in walking, not elsewhere classified  Other lack of coordination  Unsteadiness on feet     Problem List Patient Active Problem List   Diagnosis Date Noted  . Lower extremity edema 05/16/2020  . Paroxysmal atrial fibrillation ([place]) 10/26/2019  . Chest pain of uncertain etiology 81/27/5170  . History of CVA (cerebrovascular accident) 09/05/2019  . Hyperglycemia 09/05/2019  . Anemia 09/05/2019  . AKI (acute kidney injury) ([place]) 09/05/2019  . Atypical pneumonia 09/05/2019  . Coagulation disorder ([place]) 06/12/2019  . Gastroesophageal reflux disease   . Hemiplegia and hemiparesis following cerebral infarction affecting left non-dominant side  ([place]) 11/25/2018  . 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 RBBB 10/15/2018  . Sinus bradycardia 10/15/2018  . Dysphagia, post-stroke   .  Neuropathic pain   . Lacunar infarct, acute ([place]) 09/06/2018  . TIA (transient ischemic attack) 09/01/2018  . Microalbuminuria 07/29/2018  . Left leg pain 01/13/2018  . Hyperlipidemia LDL goal <70 02/11/2017  . Recurrent strokes ([place]) 11/10/2016  . Essential hypertension 11/10/2016  . Diabetes ([place]) 11/10/2016  . Tobacco abuse 08/28/2013    Larna Daughters, SPT 06/13/2020, 3:37 PM  [facility] [address] [place], [place], [address] Phone: [phone]   Fax:  [phone]  Name: [name] MRN: [id number] Date of Birth: 1956-07-28

## 2020-06-18 ENCOUNTER — Ambulatory Visit: Payer: Medicaid Other | Attending: Internal Medicine | Admitting: Physical Therapy

## 2020-06-18 ENCOUNTER — Encounter: Payer: Self-pay | Admitting: Physical Therapy

## 2020-06-18 ENCOUNTER — Other Ambulatory Visit: Payer: Self-pay

## 2020-06-18 DIAGNOSIS — Z9181 History of falling: Secondary | ICD-10-CM | POA: Insufficient documentation

## 2020-06-18 DIAGNOSIS — R2689 Other abnormalities of gait and mobility: Secondary | ICD-10-CM | POA: Diagnosis not present

## 2020-06-18 DIAGNOSIS — R262 Difficulty in walking, not elsewhere classified: Secondary | ICD-10-CM | POA: Diagnosis not present

## 2020-06-18 DIAGNOSIS — M6281 Muscle weakness (generalized): Secondary | ICD-10-CM | POA: Diagnosis not present

## 2020-06-18 DIAGNOSIS — R278 Other lack of coordination: Secondary | ICD-10-CM | POA: Insufficient documentation

## 2020-06-18 DIAGNOSIS — R2681 Unsteadiness on feet: Secondary | ICD-10-CM | POA: Insufficient documentation

## 2020-06-18 DIAGNOSIS — I69354 Hemiplegia and hemiparesis following cerebral infarction affecting left non-dominant side: Secondary | ICD-10-CM

## 2020-06-18 NOTE — Therapy (Signed)
[facility] [address]. [place], [place], [address] Phone: [phone]   Fax:  [phone]  Physical Therapy Treatment  Patient Details  Name: Evan RANDAZZO Sr. MRN: [id number] Date of Birth: Sep 27, 1955 Referring Provider (PT): Lebron Conners MD   Encounter Date: 06/18/2020   PT End of Session - 06/18/20 1507    Visit Number 4    Number of Visits 9    Date for PT Re-Evaluation 07/03/20    Authorization - Visit Number 4    Authorization - Number of Visits 10    PT Start Time 9371    PT Stop Time 1517    PT Time Calculation (min) 48 min    Equipment Utilized During Treatment Gait belt    Activity Tolerance Patient tolerated treatment well    Behavior During Therapy WFL for tasks assessed/performed           Past Medical History:  Diagnosis Date  . Carotid arterial disease ([place])    a. 08/2018 Carotid U/S: min-mod RICA atherosclerosis w/o hemodynamically significant stenosis. Nl LICA.  Marland Kitchen Chest pain    a. 08/2019 MV: EF 55%. No ischemia. Diaph attenuation noted.  . Diabetes 1.5, managed as type 2 ([place])   . Diastolic dysfunction    a. 08/2018 Echo: EF 65%. No rwma. Gr1 DD. Mild MR.  . Diastolic dysfunction    a. 08/2019 Echo: EF 55-60%, Gr1 DD. No rwma. Mild MR. RVSP 37.55mHg.  .Marland KitchenHypercholesterolemia   . Hypertension   . Left hemiparesis ([place]    a. Ambulated w/ Cane. Limited use of LUE.  .Marland KitchenPAF (paroxysmal atrial fibrillation) ([place]    a. 10/2019 Event Monitor: PAF; b. CHA2DS2VASc = 5-->Eliquis.  . Pain in both feet   . Poorly controlled diabetes mellitus ([place]    a. 04/2019 A1c 13.8.  .Marland KitchenRecurrent strokes ([place]    a. 10/2016 MRI/A: Acute 551mR thalamic infarct, ? subacute infarct of R corona radiata; b. 08/2017 MRI/A: Acute 72m79materal L thalamic infarct. Other more remote lacunar infarcts of thalami bilat. Small vessel dzs; c. 08/2018 MRI/A: Acute lacunar infarct of the post limb of R internal capsule; d. 08/2019 MRI Acute  CVA of L paramedian pons adn R cerebellar hemisphere.  . Tobacco abuse     Past Surgical History:  Procedure Laterality Date  . ESOPHAGOGASTRODUODENOSCOPY (EGD) WITH PROPOFOL N/A 04/26/2019   Procedure: ESOPHAGOGASTRODUODENOSCOPY (EGD) WITH PROPOFOL;  Surgeon: VanLin LandsmanD;  [facility] Gastroenterology;  Laterality: N/A;    There were no vitals filed for this visit.   Subjective Assessment - 06/18/20 1505    Subjective No falls and no pain. Been using cane at home. Been attempting to stretch L bicep out with passive stretching and reports minor improvements.    Pertinent History Pertinent history and PMH: HTN, uncontrolled DM, HLD, R basal ganglia stroke on 08/2018 and L pointine and R cerebellar stroke 09/07/19, sinus bradycardia, peripheral neuropat    Patient Stated Goals to get back to fishing, walking better, balance.    Currently in Pain? No/denies    Pain Score 0-No pain    Pain Onset More than a month ago          Neuro Re-Ed:   STS from blue mat table with mod tactile and verbal cuing to shift weight onto LLE: 1x10. CGA+1   STS from blue mat table and airex pad under RLE to improve L weigh tshift over LLE for  quad strength. mod tactile and verbal cuing to shift weight onto LLE: 2x10. CGA+1  Education provided on safe hand placement with STS and stand to sit transfers with good carryover after initial cue for first t/f  Alternating cone taps on blue step ladder with use of quad cane: x8 CGA+1. Verbal cues to reduce R lat lean and L hip circumduction for LLE cone tap. Fair to good carryover with intermittent cues. Indicative of L hip flexor weakness and placed pt at increased risk of falls with leaning.   Obstacle course: walking over blue mat as unstable surface --> step over 1/2 bolster, small bolster, large bolster --> 6" step up leading with RLE --> side steps on airex beam: x3 with CGA+1. Improved carryover with stepping form with LLE over  bolsters compared to cone taps.    Seated rest between exercises.   PT Education - 06/18/20 1506    Education Details Safe transfers. Form/technique for balance and gait exercises.    Person(s) Educated Patient    Methods Demonstration;Explanation;Tactile cues;Verbal cues    Comprehension Verbalized understanding;Returned demonstration            PT Short Term Goals - 11/22/19 1718      PT SHORT TERM GOAL #1   Title Patient will be independent with HEP for improved therapeutic gains and self-management.    Baseline 2/10: give next session 4/7: HEP compliant    Time 4    Period Weeks    Status Partially Met    Target Date 10/25/19             PT Long Term Goals - 06/11/20 1729      PT LONG TERM GOAL #1   Title Pt will increase Berg Balance to 50/56 to demonstrate clinically significant improvement in balance decreasing pt's risk for falls during functional activities.    Baseline 10/20: 39/56    Time 4    Period Weeks    Status New      PT LONG TERM GOAL #2   Title Pt will improve 10 m gait speed to > 0.5 m/s  with LRAD to improve safe community ambulation.    Baseline 10/20: 0.34 m/s with SPC and SBA+1    Time 4    Period Weeks    Status New      PT LONG TERM GOAL #3   Title Pt will improve 5xSTS to < 12 sec with no RUE support to demonstrate clinically significant improvement in BLE strength to improve STS t/f's like standing from the toilet.    Baseline 10/20: 14.48 sec with RUE support on hand rail.    Time 4    Period Weeks    Status New      PT LONG TERM GOAL #4   Title Pt will improve DGI by a minimum of 5 points to demonstrate clinically significant improvement and safety with community ambulation tasks to decrease risk of falls.    Baseline 10/20: 10/22    Time 4    Period Weeks    Status New      PT LONG TERM GOAL #5   Title Pt will improve FOTO score to target amount to demonstrate perceived improvements in ability to complete ADL's.    Baseline  10/20: PErform next session.    Time 4    Period Weeks    Status New      Additional Long Term Goals   Additional Long Term Goals Yes  PT LONG TERM GOAL #6   Title Pt will improve TUG time with donned AFO and LSC by 3 sec to display clinically significant improvement in safe ambulation with AD.    Baseline 10/26: 36.37 sec    Time 4    Period Weeks    Status New    Target Date 07/03/20                 Plan - 06/18/20 1745    Clinical Impression Statement Improved caryover with safe STS and stand to sit transfers with sue of quad cane. Initially required min verbal cuing for safe hand placement with excellent carryover after. With single leg stance tasks pt circumducts and R lat leans to flex R hip for cone tapping indicative of hip flexor weakness. Pt can continue to benefit from skilled PT treatment to improve functional mobility and reduce risk of falls.    Personal Factors and Comorbidities Age;Comorbidity 3+;Education;Finances;Past/Current Experience;Fitness;Transportation    Comorbidities HTN, uncontrolled DM, HLD, R basal ganglia stroke on 08/2018 and L pointine and R cerebellar stroke 09/07/19, sinus bradycardia, peripheral neuropathy    Examination-Activity Limitations Bathing;Bed Mobility;Dressing;Transfers;Squat;Lift;Locomotion Level;Stairs;Reach Overhead;Carry;Stand;Sit    Examination-Participation Restrictions Community Activity;Interpersonal Relationship;Yard Work    Merchant [employer] officer Evolving/Moderate complexity    Clinical Decision Making Moderate    Rehab Potential Fair    PT Frequency 2x / week    PT Duration 4 weeks    PT Treatment/Interventions ADLs/Self Care Home Management;Aquatic Therapy;Electrical Stimulation;Moist Heat;DME Instruction;Gait training;Stair training;Functional mobility training;Therapeutic activities;Therapeutic exercise;Balance training;Neuromuscular re-education;Patient/family education;Orthotic Fit/Training;Dry  needling;Manual techniques    PT Next Visit Plan Gait mechanics with quad cane and L quad strength.    PT Home Exercise Plan HEP: standing hip marches/abd, ankle DF/PF in sitting/supine, passive L elbow stretch for tone    Consulted and Agree with Plan of Care Patient    Family Member Consulted wife           Patient will benefit from skilled therapeutic intervention in order to improve the following deficits and impairments:  Abnormal gait, Decreased coordination, Decreased range of motion, Difficulty walking, Impaired tone, Decreased endurance, Impaired UE functional use, Decreased activity tolerance, Impaired perceived functional ability, Decreased balance, Impaired flexibility, Decreased cognition, Decreased mobility, Decreased strength, Increased edema, Postural dysfunction  Visit Diagnosis: Muscle weakness (generalized)  Hemiplegia and hemiparesis following cerebral infarction affecting left non-dominant side (HCC)  Other abnormalities of gait and mobility  At risk for falls  Difficulty in walking, not elsewhere classified  Other lack of coordination  Unsteadiness on feet     Problem List Patient Active Problem List   Diagnosis Date Noted  . Lower extremity edema 05/16/2020  . Paroxysmal atrial fibrillation ([place]) 10/26/2019  . Chest pain of uncertain etiology 74/16/3845  . History of CVA (cerebrovascular accident) 09/05/2019  . Hyperglycemia 09/05/2019  . Anemia 09/05/2019  . AKI (acute kidney injury) ([place]) 09/05/2019  . Atypical pneumonia 09/05/2019  . Coagulation disorder ([place]) 06/12/2019  . Gastroesophageal reflux disease   . Hemiplegia and hemiparesis following cerebral infarction affecting left non-dominant side ([place]) 11/25/2018  . 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 RBBB 10/15/2018  . Sinus bradycardia 10/15/2018  . Dysphagia, post-stroke   . Neuropathic pain   . Lacunar infarct, acute ([place]) 09/06/2018  . TIA (transient ischemic attack) 09/01/2018  .  Microalbuminuria 07/29/2018  . Left leg pain 01/13/2018  . Hyperlipidemia LDL goal <70 02/11/2017  . Recurrent strokes ([place]) 11/10/2016  . Essential hypertension 11/10/2016  . Diabetes ([place]) 11/10/2016  .  Tobacco abuse 08/28/2013   Pura Spice, PT, DPT # [id number] SPT 06/18/2020, 5:50 PM  [facility] [address] [place], [place], [address] Phone: [phone]   Fax:  [phone]  Name: Evan Cones Sr. MRN: [id number] Date of Birth: 11-29-1955

## 2020-06-20 ENCOUNTER — Ambulatory Visit: Payer: Medicaid Other

## 2020-06-20 ENCOUNTER — Other Ambulatory Visit: Payer: Self-pay

## 2020-06-20 ENCOUNTER — Encounter: Payer: Self-pay | Admitting: Physical Therapy

## 2020-06-20 DIAGNOSIS — R2689 Other abnormalities of gait and mobility: Secondary | ICD-10-CM | POA: Diagnosis not present

## 2020-06-20 DIAGNOSIS — R2681 Unsteadiness on feet: Secondary | ICD-10-CM

## 2020-06-20 DIAGNOSIS — M6281 Muscle weakness (generalized): Secondary | ICD-10-CM

## 2020-06-20 DIAGNOSIS — R278 Other lack of coordination: Secondary | ICD-10-CM | POA: Diagnosis not present

## 2020-06-20 DIAGNOSIS — Z9181 History of falling: Secondary | ICD-10-CM

## 2020-06-20 DIAGNOSIS — R262 Difficulty in walking, not elsewhere classified: Secondary | ICD-10-CM

## 2020-06-20 DIAGNOSIS — I69354 Hemiplegia and hemiparesis following cerebral infarction affecting left non-dominant side: Secondary | ICD-10-CM | POA: Diagnosis not present

## 2020-06-20 NOTE — Therapy (Signed)
[facility] [address] [place], [place], [address] Phone: [phone]   Fax:  [phone]  Physical Therapy Treatment  Patient Details  Name: Evan TREJOS Sr. MRN: [id number] Date of Birth: [birth year] Referring Provider (PT): Lebron Conners MD   Encounter Date: 06/20/2020   PT End of Session - 06/20/20 1510    Visit Number 5    Number of Visits 9    Date for PT Re-Evaluation 07/03/20    Authorization - Visit Number 4    Authorization - Number of Visits 10    PT Start Time 6720    PT Stop Time 1516    PT Time Calculation (min) 48 min    Equipment Utilized During Treatment Gait belt    Activity Tolerance Patient tolerated treatment well    Behavior During Therapy WFL for tasks assessed/performed           Past Medical History:  Diagnosis Date  . Carotid arterial disease ([place])    a. 08/2018 Carotid U/S: min-mod RICA atherosclerosis w/o hemodynamically significant stenosis. Nl LICA.  Marland Kitchen Chest pain    a. 08/2019 MV: EF 55%. No ischemia. Diaph attenuation noted.  . Diabetes 1.5, managed as type 2 ([place])   . Diastolic dysfunction    a. 08/2018 Echo: EF 65%. No rwma. Gr1 DD. Mild MR.  . Diastolic dysfunction    a. 08/2019 Echo: EF 55-60%, Gr1 DD. No rwma. Mild MR. RVSP 37.61mHg.  .Marland KitchenHypercholesterolemia   . Hypertension   . Left hemiparesis ([place]    a. Ambulated w/ Cane. Limited use of LUE.  .Marland KitchenPAF (paroxysmal atrial fibrillation) ([place]    a. 10/2019 Event Monitor: PAF; b. CHA2DS2VASc = 5-->Eliquis.  . Pain in both feet   . Poorly controlled diabetes mellitus ([place]    a. 04/2019 A1c 13.8.  .Marland KitchenRecurrent strokes ([place]    a. 10/2016 MRI/A: Acute 513mR thalamic infarct, ? subacute infarct of R corona radiata; b. 08/2017 MRI/A: Acute 57m53materal L thalamic infarct. Other more remote lacunar infarcts of thalami bilat. Small vessel dzs; c. 08/2018 MRI/A: Acute lacunar infarct of the post limb of R internal capsule; d. 08/2019 MRI Acute  CVA of L paramedian pons adn R cerebellar hemisphere.  . Tobacco abuse     Past Surgical History:  Procedure Laterality Date  . ESOPHAGOGASTRODUODENOSCOPY (EGD) WITH PROPOFOL N/A 04/26/2019   Procedure: ESOPHAGOGASTRODUODENOSCOPY (EGD) WITH PROPOFOL;  Surgeon: VanLin LandsmanD;  [facility];  Laterality: N/A;    There were no vitals filed for this visit.   Subjective Assessment - 06/20/20 1433    Subjective Pt reports he believes his LE's are getting stronger. Noticed improvements at home with standing. No falls reported. No pain.    Pertinent History Pertinent history and PMH: HTN, uncontrolled DM, HLD, R basal ganglia stroke on 08/2018 and L pointine and R cerebellar stroke 09/07/19, sinus bradycardia, peripheral neuropat    Patient Stated Goals to get back to fishing, walking better, balance.    Currently in Pain? No/denies    Pain Score 0-No pain    Pain Onset More than a month ago          There.ex:   Nu-Step L4 for 5 min. UE/LE use. Cues to maintain > 70 SPM to improve endurance for walking and standing tasks.  STS with airex pad under RLE to improve WB'ing on LLE. 1x10 at 20" tall with CGA+1. Good carryover with form.  Progressed to 2x20 at 18" and CGA+1. Use of momentum to stand intermittently due to decreased height. Verbal cues for shifting weight anteriorly over knees.    Neuro Re-Ed:   Cone reaches outside BOS with RUE: 2x9 cones. First bout focusing outside BOS on ipsilateral side. Second bout focusing across body, contralateral reaches outside BOS. CGA+1. No LOB.   Walking with quad cane stepping over small hurdles on blue mat (3 hurdles) with alternating cone taps: x2, CGA+1. Verbal cues for decreasing R lat lean due to L hip flexor weakness. Progressed to turning around cones after hurdles, focusing on quad cane sequencing and picking up feet to reduce tripping with turns. X4.   Walking in // bars with no AD and no UE support:  x4. CGA+1. Verbal cues for wider BOS and improving stance time on LLE and increasing B step length. Decreased gait speed noted however no LOB or lean.     Seated rest breaks between exercises due to fatigue.     PT Education - 06/20/20 1509    Education Details gait mechanics. form/technique with exercise.    Person(s) Educated Patient    Methods Explanation;Demonstration;Tactile cues;Verbal cues    Comprehension Verbalized understanding;Returned demonstration            PT Short Term Goals - 11/22/19 1718      PT SHORT TERM GOAL #1   Title Patient will be independent with HEP for improved therapeutic gains and self-management.    Baseline 2/10: give next session 4/7: HEP compliant    Time 4    Period Weeks    Status Partially Met    Target Date 10/25/19             PT Long Term Goals - 06/11/20 1729      PT LONG TERM GOAL #1   Title Pt will increase Berg Balance to 50/56 to demonstrate clinically significant improvement in balance decreasing pt's risk for falls during functional activities.    Baseline 10/20: 39/56    Time 4    Period Weeks    Status New      PT LONG TERM GOAL #2   Title Pt will improve 10 m gait speed to > 0.5 m/s  with LRAD to improve safe community ambulation.    Baseline 10/20: 0.34 m/s with SPC and SBA+1    Time 4    Period Weeks    Status New      PT LONG TERM GOAL #3   Title Pt will improve 5xSTS to < 12 sec with no RUE support to demonstrate clinically significant improvement in BLE strength to improve STS t/f's like standing from the toilet.    Baseline 10/20: 14.48 sec with RUE support on hand rail.    Time 4    Period Weeks    Status New      PT LONG TERM GOAL #4   Title Pt will improve DGI by a minimum of 5 points to demonstrate clinically significant improvement and safety with community ambulation tasks to decrease risk of falls.    Baseline 10/20: 10/22    Time 4    Period Weeks    Status New      PT LONG TERM GOAL #5    Title Pt will improve FOTO score to target amount to demonstrate perceived improvements in ability to complete ADL's.    Baseline 10/20: PErform next session.    Time 4    Period Weeks    Status  New      Additional Long Term Goals   Additional Long Term Goals Yes      PT LONG TERM GOAL #6   Title Pt will improve TUG time with donned AFO and LSC by 3 sec to display clinically significant improvement in safe ambulation with AD.    Baseline 10/26: 36.37 sec    Time 4    Period Weeks    Status New    Target Date 07/03/20                 Plan - 06/20/20 1510    Clinical Impression Statement Pt demonstrating good carryover with amb with quad cane with improved step lengths and sequencing of device. Excellent balance with reaching outside BOS on R side of body and contralateral side. Still relying on slight R lat lean for stepping over obstacles with LLE indicative of L hip flexor weakness. Pt displayed ability to amb in // bars with no UE assist requiring CGA+1 with narrow BOS and decreased stance time on RLE. However no LOB. Pt can continue to benefit from skilled PT treatment to reduce risk of falls and improve functional mobility.    Personal Factors and Comorbidities Age;Comorbidity 3+;Education;Finances;Past/Current Experience;Fitness;Transportation    Comorbidities HTN, uncontrolled DM, HLD, R basal ganglia stroke on 08/2018 and L pointine and R cerebellar stroke 09/07/19, sinus bradycardia, peripheral neuropathy    Examination-Activity Limitations Bathing;Bed Mobility;Dressing;Transfers;Squat;Lift;Locomotion Level;Stairs;Reach Overhead;Carry;Stand;Sit    Examination-Participation Restrictions Community Activity;Interpersonal Relationship;Yard Work    Merchant [employer] officer Evolving/Moderate complexity    Clinical Decision Making Moderate    Rehab Potential Fair    PT Frequency 2x / week    PT Duration 4 weeks    PT Treatment/Interventions ADLs/Self Care Home  Management;Aquatic Therapy;Electrical Stimulation;Moist Heat;DME Instruction;Gait training;Stair training;Functional mobility training;Therapeutic activities;Therapeutic exercise;Balance training;Neuromuscular re-education;Patient/family education;Orthotic Fit/Training;Dry needling;Manual techniques    PT Next Visit Plan Dynamic gait with quad cane.    PT Home Exercise Plan HEP: standing hip marches/abd, ankle DF/PF in sitting/supine, passive L elbow stretch for tone    Consulted and Agree with Plan of Care Patient    Family Member Consulted wife           Patient will benefit from skilled therapeutic intervention in order to improve the following deficits and impairments:  Abnormal gait, Decreased coordination, Decreased range of motion, Difficulty walking, Impaired tone, Decreased endurance, Impaired UE functional use, Decreased activity tolerance, Impaired perceived functional ability, Decreased balance, Impaired flexibility, Decreased cognition, Decreased mobility, Decreased strength, Increased edema, Postural dysfunction  Visit Diagnosis: Muscle weakness (generalized)  Hemiplegia and hemiparesis following cerebral infarction affecting left non-dominant side (HCC)  Other abnormalities of gait and mobility  At risk for falls  Difficulty in walking, not elsewhere classified  Other lack of coordination  Unsteadiness on feet     Problem List Patient Active Problem List   Diagnosis Date Noted  . Lower extremity edema 05/16/2020  . Paroxysmal atrial fibrillation ([place]) 10/26/2019  . Chest pain of uncertain etiology 03/00/9233  . History of CVA (cerebrovascular accident) 09/05/2019  . Hyperglycemia 09/05/2019  . Anemia 09/05/2019  . AKI (acute kidney injury) ([place]) 09/05/2019  . Atypical pneumonia 09/05/2019  . Coagulation disorder ([place]) 06/12/2019  . Gastroesophageal reflux disease   . Hemiplegia and hemiparesis following cerebral infarction affecting left non-dominant side  ([place]) 11/25/2018  . 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 RBBB 10/15/2018  . Sinus bradycardia 10/15/2018  . Dysphagia, post-stroke   . Neuropathic pain   . Lacunar infarct,  acute ([place]) 09/06/2018  . TIA (transient ischemic attack) 09/01/2018  . Microalbuminuria 07/29/2018  . Left leg pain 01/13/2018  . Hyperlipidemia LDL goal <70 02/11/2017  . Recurrent strokes ([place]) 11/10/2016  . Essential hypertension 11/10/2016  . Diabetes ([place]) 11/10/2016  . Tobacco abuse 08/28/2013    Larna Daughters, SPT 06/20/2020, 4:40 PM  [facility] [address] [place], [place], [address] Phone: [phone]   Fax:  [phone]  Name: Evan Cones Sr. MRN: [id number] Date of Birth: [birth year]

## 2020-06-21 ENCOUNTER — Other Ambulatory Visit: Payer: Self-pay | Admitting: Gastroenterology

## 2020-06-24 ENCOUNTER — Ambulatory Visit: Payer: Medicaid Other | Admitting: Physical Therapy

## 2020-06-24 ENCOUNTER — Encounter: Payer: Self-pay | Admitting: Physical Therapy

## 2020-06-24 ENCOUNTER — Other Ambulatory Visit: Payer: Self-pay

## 2020-06-24 DIAGNOSIS — R278 Other lack of coordination: Secondary | ICD-10-CM

## 2020-06-24 DIAGNOSIS — Z9181 History of falling: Secondary | ICD-10-CM | POA: Diagnosis not present

## 2020-06-24 DIAGNOSIS — M6281 Muscle weakness (generalized): Secondary | ICD-10-CM | POA: Diagnosis not present

## 2020-06-24 DIAGNOSIS — R2689 Other abnormalities of gait and mobility: Secondary | ICD-10-CM

## 2020-06-24 DIAGNOSIS — I69354 Hemiplegia and hemiparesis following cerebral infarction affecting left non-dominant side: Secondary | ICD-10-CM | POA: Diagnosis not present

## 2020-06-24 DIAGNOSIS — R262 Difficulty in walking, not elsewhere classified: Secondary | ICD-10-CM

## 2020-06-24 DIAGNOSIS — R2681 Unsteadiness on feet: Secondary | ICD-10-CM | POA: Diagnosis not present

## 2020-06-24 NOTE — Therapy (Signed)
[facility] [address] [place], [place], [address] Phone: [phone]   Fax:  [phone]  Physical Therapy Treatment  Patient Details  Name: Evan GRYGIEL Sr. MRN: [id number] Date of Birth: 1956-02-13 Referring Provider (PT): Lebron Conners MD   Encounter Date: 06/24/2020   PT End of Session - 06/24/20 1557    Visit Number 6    Number of Visits 9    Date for PT Re-Evaluation 07/03/20    Authorization - Visit Number 5    Authorization - Number of Visits 10    PT Start Time 3845    PT Stop Time 1517    PT Time Calculation (min) 45 min    Equipment Utilized During Treatment Gait belt    Activity Tolerance Patient tolerated treatment well    Behavior During Therapy WFL for tasks assessed/performed           Past Medical History:  Diagnosis Date  . Carotid arterial disease ([place])    a. 08/2018 Carotid U/S: min-mod RICA atherosclerosis w/o hemodynamically significant stenosis. Nl LICA.  Marland Kitchen Chest pain    a. 08/2019 MV: EF 55%. No ischemia. Diaph attenuation noted.  . Diabetes 1.5, managed as type 2 ([place])   . Diastolic dysfunction    a. 08/2018 Echo: EF 65%. No rwma. Gr1 DD. Mild MR.  . Diastolic dysfunction    a. 08/2019 Echo: EF 55-60%, Gr1 DD. No rwma. Mild MR. RVSP 37.64mHg.  .Marland KitchenHypercholesterolemia   . Hypertension   . Left hemiparesis ([place]    a. Ambulated w/ Cane. Limited use of LUE.  .Marland KitchenPAF (paroxysmal atrial fibrillation) ([place]    a. 10/2019 Event Monitor: PAF; b. CHA2DS2VASc = 5-->Eliquis.  . Pain in both feet   . Poorly controlled diabetes mellitus ([place]    a. 04/2019 A1c 13.8.  .Marland KitchenRecurrent strokes ([place]La [place]    a. 10/2016 MRI/A: Acute 545mR thalamic infarct, ? subacute infarct of R corona radiata; b. 08/2017 MRI/A: Acute 43m25materal L thalamic infarct. Other more remote lacunar infarcts of thalami bilat. Small vessel dzs; c. 08/2018 MRI/A: Acute lacunar infarct of the post limb of R internal capsule; d. 08/2019 MRI Acute  CVA of L paramedian pons adn R cerebellar hemisphere.  . Tobacco abuse     Past Surgical History:  Procedure Laterality Date  . ESOPHAGOGASTRODUODENOSCOPY (EGD) WITH PROPOFOL N/A 04/26/2019   Procedure: ESOPHAGOGASTRODUODENOSCOPY (EGD) WITH PROPOFOL;  Surgeon: VanLin LandsmanD;  [facility];  Laterality: N/A;    There were no vitals filed for this visit.   Subjective Assessment - 06/24/20 1513    Subjective Pt amb into clinic today with no AFO on LLE and with his own quad cane. Pt reports switching the base on his quad cane indep and sized in appropriately. No falls since previous session.    Pertinent History Pertinent history and PMH: HTN, uncontrolled DM, HLD, R basal ganglia stroke on 08/2018 and L pointine and R cerebellar stroke 09/07/19, sinus bradycardia, peripheral neuropat    Patient Stated Goals to get back to fishing, walking better, balance.    Currently in Pain? No/denies    Pain Onset More than a month ago           There.ex:   Nu-Step L2 for 7 min. UE/LE use for improved muscle endurance for ADL's.   Standing B hip abduction: 3 lbs AW on RLE and no resistance on LLE. 2x10. Mod verbal and tactile  cues for form/technique with fair carryover after cuing.   STS from blue mat at lowest height with airex pad under RLE: 2x8 reps. Good carryover with weight shift onto LLE to improve LLE strength. Mod verbal cues for good eccentric control. SBA+1    Neuro Re-Ed:   Side steps on airex pad for balance and hip stability/strength: x6 with R finger support. CGA+1   Forwards/backwards resisted walking with x1 BTB in // bars: x6/direction. Mod verbal cues for eccentric control. Increased difficulty with forward walking eccentric control portion requiring intermittent RUE support due to balance. CGA+1  Reassessed pt's gait with personal quad cane brought from home. Appropriate height and safe use with walking and transfers. 2 laps in therapy  gym. Decreased LLE swing phase and intermittent toe catching due to not wearing AFO in clinic. Verbal cues for improving L hip flexion and knee flexion in swing phase to reduce L toe catching with good carryover after cues. CGA+1     PT Education - 06/24/20 1556    Education Details Importance of wearing AFO on LLE to reduce risk of falls.    Person(s) Educated Patient    Methods Explanation;Demonstration    Comprehension Verbalized understanding            PT Short Term Goals - 11/22/19 1718      PT SHORT TERM GOAL #1   Title Patient will be independent with HEP for improved therapeutic gains and self-management.    Baseline 2/10: give next session 4/7: HEP compliant    Time 4    Period Weeks    Status Partially Met    Target Date 10/25/19             PT Long Term Goals - 06/11/20 1729      PT LONG TERM GOAL #1   Title Pt will increase Berg Balance to 50/56 to demonstrate clinically significant improvement in balance decreasing pt's risk for falls during functional activities.    Baseline 10/20: 39/56    Time 4    Period Weeks    Status New      PT LONG TERM GOAL #2   Title Pt will improve 10 m gait speed to > 0.5 m/s  with LRAD to improve safe community ambulation.    Baseline 10/20: 0.34 m/s with SPC and SBA+1    Time 4    Period Weeks    Status New      PT LONG TERM GOAL #3   Title Pt will improve 5xSTS to < 12 sec with no RUE support to demonstrate clinically significant improvement in BLE strength to improve STS t/f's like standing from the toilet.    Baseline 10/20: 14.48 sec with RUE support on hand rail.    Time 4    Period Weeks    Status New      PT LONG TERM GOAL #4   Title Pt will improve DGI by a minimum of 5 points to demonstrate clinically significant improvement and safety with community ambulation tasks to decrease risk of falls.    Baseline 10/20: 10/22    Time 4    Period Weeks    Status New      PT LONG TERM GOAL #5   Title Pt will  improve FOTO score to target amount to demonstrate perceived improvements in ability to complete ADL's.    Baseline 10/20: PErform next session.    Time 4    Period Weeks    Status New  Additional Long Term Goals   Additional Long Term Goals Yes      PT LONG TERM GOAL #6   Title Pt will improve TUG time with donned AFO and LSC by 3 sec to display clinically significant improvement in safe ambulation with AD.    Baseline 10/26: 36.37 sec    Time 4    Period Weeks    Status New    Target Date 07/03/20                 Plan - 06/24/20 1558    Clinical Impression Statement Pt educated on importance of wearing L AFO to reduce foot drop so pt is at reduced risk of falls. Pt may benefit from heel wedge or visiting orthotist to prevent or redcue L knee recurvatum with gait due to LLE muscle weakness, which is lessening pt's adherence to wear AFO. Pt improving in his ability to perform STS's with weight shifted on LLE requiring no therapist assist. Pt can continue to benefit from skilled PT treatment to reduce risk of falls and improve pt's functional mobility.    Personal Factors and Comorbidities Age;Comorbidity 3+;Education;Finances;Past/Current Experience;Fitness;Transportation    Comorbidities HTN, uncontrolled DM, HLD, R basal ganglia stroke on 08/2018 and L pointine and R cerebellar stroke 09/07/19, sinus bradycardia, peripheral neuropathy    Examination-Activity Limitations Bathing;Bed Mobility;Dressing;Transfers;Squat;Lift;Locomotion Level;Stairs;Reach Overhead;Carry;Stand;Sit    Examination-Participation Restrictions Community Activity;Interpersonal Relationship;Yard Work    Merchant [employer] officer Evolving/Moderate complexity    Clinical Decision Making Moderate    Rehab Potential Fair    PT Frequency 2x / week    PT Duration 4 weeks    PT Treatment/Interventions ADLs/Self Care Home Management;Aquatic Therapy;Electrical Stimulation;Moist Heat;DME Instruction;Gait  training;Stair training;Functional mobility training;Therapeutic activities;Therapeutic exercise;Balance training;Neuromuscular re-education;Patient/family education;Orthotic Fit/Training;Dry needling;Manual techniques    PT Next Visit Plan Dynamic gait with quad cane.    PT Home Exercise Plan HEP: standing hip marches/abd, ankle DF/PF in sitting/supine, passive L elbow stretch for tone    Consulted and Agree with Plan of Care Patient    Family Member Consulted wife           Patient will benefit from skilled therapeutic intervention in order to improve the following deficits and impairments:  Abnormal gait, Decreased coordination, Decreased range of motion, Difficulty walking, Impaired tone, Decreased endurance, Impaired UE functional use, Decreased activity tolerance, Impaired perceived functional ability, Decreased balance, Impaired flexibility, Decreased cognition, Decreased mobility, Decreased strength, Increased edema, Postural dysfunction  Visit Diagnosis: Muscle weakness (generalized)  Hemiplegia and hemiparesis following cerebral infarction affecting left non-dominant side (HCC)  Other abnormalities of gait and mobility  At risk for falls  Difficulty in walking, not elsewhere classified  Other lack of coordination  Unsteadiness on feet     Problem List Patient Active Problem List   Diagnosis Date Noted  . Lower extremity edema 05/16/2020  . Paroxysmal atrial fibrillation ([place]) 10/26/2019  . Chest pain of uncertain etiology 56/38/9373  . History of CVA (cerebrovascular accident) 09/05/2019  . Hyperglycemia 09/05/2019  . Anemia 09/05/2019  . AKI (acute kidney injury) ([place]) 09/05/2019  . Atypical pneumonia 09/05/2019  . Coagulation disorder ([place]) 06/12/2019  . Gastroesophageal reflux disease   . Hemiplegia and hemiparesis following cerebral infarction affecting left non-dominant side ([place]) 11/25/2018  . 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 RBBB 10/15/2018  . Sinus  bradycardia 10/15/2018  . Dysphagia, post-stroke   . Neuropathic pain   . Lacunar infarct, acute ([place]) 09/06/2018  . TIA (transient ischemic attack) 09/01/2018  . Microalbuminuria  07/29/2018  . Left leg pain 01/13/2018  . Hyperlipidemia LDL goal <70 02/11/2017  . Recurrent strokes ([place]) 11/10/2016  . Essential hypertension 11/10/2016  . Diabetes ([place]) 11/10/2016  . Tobacco abuse 08/28/2013   Pura Spice, PT, DPT # [id number] SPT 06/25/2020, 8:31 AM  [facility] [address] [place], [place], [address] Phone: [phone]   Fax:  [phone]  Name: SHAWNTEZ DICKISON Sr. MRN: [id number] Date of Birth: August 18, 1955

## 2020-06-26 ENCOUNTER — Other Ambulatory Visit: Payer: Self-pay

## 2020-06-26 ENCOUNTER — Encounter: Payer: Self-pay | Admitting: Physical Therapy

## 2020-06-26 ENCOUNTER — Ambulatory Visit: Payer: Medicaid Other | Admitting: Physical Therapy

## 2020-06-26 DIAGNOSIS — M6281 Muscle weakness (generalized): Secondary | ICD-10-CM

## 2020-06-26 DIAGNOSIS — R2681 Unsteadiness on feet: Secondary | ICD-10-CM

## 2020-06-26 DIAGNOSIS — Z9181 History of falling: Secondary | ICD-10-CM

## 2020-06-26 DIAGNOSIS — R2689 Other abnormalities of gait and mobility: Secondary | ICD-10-CM

## 2020-06-26 DIAGNOSIS — R262 Difficulty in walking, not elsewhere classified: Secondary | ICD-10-CM | POA: Diagnosis not present

## 2020-06-26 DIAGNOSIS — I69354 Hemiplegia and hemiparesis following cerebral infarction affecting left non-dominant side: Secondary | ICD-10-CM | POA: Diagnosis not present

## 2020-06-26 DIAGNOSIS — R278 Other lack of coordination: Secondary | ICD-10-CM

## 2020-06-26 NOTE — Therapy (Signed)
[facility] [address] [place], [place], [address] Phone: [phone]   Fax:  [phone]  Physical Therapy Treatment  Patient Details  Name: Evan RON Sr. MRN: [id number] Date of Birth: 25-Apr-1956 Referring Provider (PT): Lebron Conners MD   Encounter Date: 06/26/2020   PT End of Session - 06/26/20 1518    Visit Number 7    Number of Visits 9    Date for PT Re-Evaluation 07/03/20    Authorization - Visit Number 7    Authorization - Number of Visits 10    PT Start Time 5397    PT Stop Time 1503    PT Time Calculation (min) 42 min    Equipment Utilized During Treatment Gait belt    Activity Tolerance Patient tolerated treatment well    Behavior During Therapy WFL for tasks assessed/performed           Past Medical History:  Diagnosis Date  . Carotid arterial disease ([place])    a. 08/2018 Carotid U/S: min-mod RICA atherosclerosis w/o hemodynamically significant stenosis. Nl LICA.  Marland Kitchen Chest pain    a. 08/2019 MV: EF 55%. No ischemia. Diaph attenuation noted.  . Diabetes 1.5, managed as type 2 ([place])   . Diastolic dysfunction    a. 08/2018 Echo: EF 65%. No rwma. Gr1 DD. Mild MR.  . Diastolic dysfunction    a. 08/2019 Echo: EF 55-60%, Gr1 DD. No rwma. Mild MR. RVSP 37.46mHg.  .Marland KitchenHypercholesterolemia   . Hypertension   . Left hemiparesis ([place]    a. Ambulated w/ Cane. Limited use of LUE.  .Marland KitchenPAF (paroxysmal atrial fibrillation) ([place]    a. 10/2019 Event Monitor: PAF; b. CHA2DS2VASc = 5-->Eliquis.  . Pain in both feet   . Poorly controlled diabetes mellitus ([place]    a. 04/2019 A1c 13.8.  .[place]land KitchenRecurrent strokes ([place]    a. 10/2016 MRI/A: Acute 565mR thalamic infarct, ? subacute infarct of R corona radiata; b. 08/2017 MRI/A: Acute 41m47materal L thalamic infarct. Other more remote lacunar infarcts of thalami bilat. Small vessel dzs; c. 08/2018 MRI/A: Acute lacunar infarct of the post limb of R internal capsule; d. 08/2019 MRI  Acute CVA of L paramedian pons adn R cerebellar hemisphere.  . Tobacco abuse     Past Surgical History:  Procedure Laterality Date  . ESOPHAGOGASTRODUODENOSCOPY (EGD) WITH PROPOFOL N/A 04/26/2019   Procedure: ESOPHAGOGASTRODUODENOSCOPY (EGD) WITH PROPOFOL;  Surgeon: VanLin LandsmanD;  [facility];  Laterality: N/A;    There were no vitals filed for this visit.   Subjective Assessment - 06/26/20 1517    Subjective Pt with AFO on LLE today. Pt reports no falls and that things are going well for him at home.    Pertinent History Pertinent history and PMH: HTN, uncontrolled DM, HLD, R basal ganglia stroke on 08/2018 and L pointine and R cerebellar stroke 09/07/19, sinus bradycardia, peripheral neuropat    Patient Stated Goals to get back to fishing, walking better, balance.    Currently in Pain? No/denies    Pain Onset More than a month ago          There.ex:   Nu-Step L3 for 10 min. UE/LE use for improved endurance.    Neuro Re-Ed:   STS with blue mat at lowest point: 2x12. Use of mirror in front of pt for  Improved weight shift onto LLE. CGA+1   Alternating cone taps with 3 lbs AW on  RLE and no resistance on LLE with quad cane. X6. Mod verbal cues to reduce R lat lean due to L hip flexor weakness. CGA+1  Progressing stepping over 1/2 bolster --> small bolster --> large bolster --> 6" step: x4 with CGA+1. Mod verbal cues for safe sequencing quad cane with traversing over obstacles with fair carryover after cuing.     Increased time to complete exercises along with seated rest breaks required due to fatigue.   PT Education - 06/26/20 1518    Education Details form/technique with exercise.    Person(s) Educated Patient    Methods Explanation;Demonstration;Tactile cues;Verbal cues    Comprehension Verbalized understanding;Returned demonstration;Need further instruction            PT Short Term Goals - 11/22/19 1718      PT SHORT  TERM GOAL #1   Title Patient will be independent with HEP for improved therapeutic gains and self-management.    Baseline 2/10: give next session 4/7: HEP compliant    Time 4    Period Weeks    Status Partially Met    Target Date 10/25/19             PT Long Term Goals - 06/11/20 1729      PT LONG TERM GOAL #1   Title Pt will increase Berg Balance to 50/56 to demonstrate clinically significant improvement in balance decreasing pt's risk for falls during functional activities.    Baseline 10/20: 39/56    Time 4    Period Weeks    Status New      PT LONG TERM GOAL #2   Title Pt will improve 10 m gait speed to > 0.5 m/s  with LRAD to improve safe community ambulation.    Baseline 10/20: 0.34 m/s with SPC and SBA+1    Time 4    Period Weeks    Status New      PT LONG TERM GOAL #3   Title Pt will improve 5xSTS to < 12 sec with no RUE support to demonstrate clinically significant improvement in BLE strength to improve STS t/f's like standing from the toilet.    Baseline 10/20: 14.48 sec with RUE support on hand rail.    Time 4    Period Weeks    Status New      PT LONG TERM GOAL #4   Title Pt will improve DGI by a minimum of 5 points to demonstrate clinically significant improvement and safety with community ambulation tasks to decrease risk of falls.    Baseline 10/20: 10/22    Time 4    Period Weeks    Status New      PT LONG TERM GOAL #5   Title Pt will improve FOTO score to target amount to demonstrate perceived improvements in ability to complete ADL's.    Baseline 10/20: PErform next session.    Time 4    Period Weeks    Status New      Additional Long Term Goals   Additional Long Term Goals Yes      PT LONG TERM GOAL #6   Title Pt will improve TUG time with donned AFO and LSC by 3 sec to display clinically significant improvement in safe ambulation with AD.    Baseline 10/26: 36.37 sec    Time 4    Period Weeks    Status New    Target Date 07/03/20  Plan - 06/26/20 1519    Clinical Impression Statement Pt focus on dynamic balance with hip flexor strength and glut med strength. Pt still requires mod verbal cues to reduce R lat lean due to LLE residual weakness from chronic stroke. Good carryover after cues. Pt requires cuing for safe sequencing of quad cane and LLE with stepping over obstacles with fair carryover. Pt given faxed sheet on receiving [place] but pt educated on requesting [place] without the cuff due to difficulty donning/doffing 2/2 LUE spasticity. Pt verbalized understanding. Pt can continue to benefit from skilled PT treatment to reduce risk of falls and improve functional mobility.    Personal Factors and Comorbidities Age;Comorbidity 3+;Education;Finances;Past/Current Experience;Fitness;Transportation    Comorbidities HTN, uncontrolled DM, HLD, R basal ganglia stroke on 08/2018 and L pointine and R cerebellar stroke 09/07/19, sinus bradycardia, peripheral neuropathy    Examination-Activity Limitations Bathing;Bed Mobility;Dressing;Transfers;Squat;Lift;Locomotion Level;Stairs;Reach Overhead;Carry;Stand;Sit    Examination-Participation Restrictions Community Activity;Interpersonal Relationship;Yard Work    [employer] Evolving/Moderate complexity    Clinical Decision Making Moderate    Rehab Potential Fair    PT Frequency 2x / week    PT Duration 4 weeks    PT Treatment/Interventions ADLs/Self Care Home Management;Aquatic Therapy;Electrical Stimulation;Moist Heat;DME Instruction;Gait training;Stair training;Functional mobility training;Therapeutic activities;Therapeutic exercise;Balance training;Neuromuscular re-education;Patient/family education;Orthotic Fit/Training;Dry needling;Manual techniques    PT Next Visit Plan Dynamic gait with quad cane.    PT Home Exercise Plan HEP: standing hip marches/abd, ankle DF/PF in sitting/supine, passive L elbow stretch for tone    Consulted and Agree with Plan  of Care Patient    Family Member Consulted wife           Patient will benefit from skilled therapeutic intervention in order to improve the following deficits and impairments:  Abnormal gait, Decreased coordination, Decreased range of motion, Difficulty walking, Impaired tone, Decreased endurance, Impaired UE functional use, Decreased activity tolerance, Impaired perceived functional ability, Decreased balance, Impaired flexibility, Decreased cognition, Decreased mobility, Decreased strength, Increased edema, Postural dysfunction  Visit Diagnosis: Muscle weakness (generalized)  Hemiplegia and hemiparesis following cerebral infarction affecting left non-dominant side (HCC)  Other abnormalities of gait and mobility  At risk for falls  Difficulty in walking, not elsewhere classified  Other lack of coordination  Unsteadiness on feet     Problem List Patient Active Problem List   Diagnosis Date Noted  . Lower extremity edema 05/16/2020  . Paroxysmal atrial fibrillation ([place]) 10/26/2019  . Chest pain of uncertain etiology 04/59/9774  . History of CVA (cerebrovascular accident) 09/05/2019  . Hyperglycemia 09/05/2019  . Anemia 09/05/2019  . AKI (acute kidney injury) ([place]) 09/05/2019  . Atypical pneumonia 09/05/2019  . Coagulation disorder ([place]) 06/12/2019  . Gastroesophageal reflux disease   . Hemiplegia and hemiparesis following cerebral infarction affecting left non-dominant side ([place]) 11/25/2018  . 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 RBBB 10/15/2018  . Sinus bradycardia 10/15/2018  . Dysphagia, post-stroke   . Neuropathic pain   . Lacunar infarct, acute ([place]) 09/06/2018  . TIA (transient ischemic attack) 09/01/2018  . Microalbuminuria 07/29/2018  . Left leg pain 01/13/2018  . Hyperlipidemia LDL goal <70 02/11/2017  . Recurrent strokes ([place]) 11/10/2016  . Essential hypertension 11/10/2016  . Diabetes ([place]) 11/10/2016  . Tobacco abuse 08/28/2013    Pura Spice, PT, DPT  # [address] SPT 06/27/2020, 8:33 AM  [facility] [address] [place], [place], [address] Phone: [phone]   Fax:  [phone]  Name: Evan LANCOUR Sr.  MRN: [id number] Date of Birth: [birth year]

## 2020-06-26 NOTE — Progress Notes (Signed)
Patient ID: Evan Evan Sr., male    DOB: [birth year], 64 y.o.   MRN: [id number]  PCP: Towanda Malkin, MD  Chief Complaint  Patient presents with  . Follow-up    Subjective:   Evan Townsend Sr. is a 64 y.o. male, presents to clinic with CC of the following:  Chief Complaint  Patient presents with  . Follow-up    HPI:  Patient is a 64 year old male Last visit with me was 05/16/2020 Communication with the lab results after that last follow-up visit were as follows:  Please let the patient know that his lab results from yesterday were reviewed. The A1c remains elevated at 13.1. The basic metabolic panel showed an elevated glucose at 331, improved from 3 months ago when it was over 400, but still much higher than desired , with the kidney function improved from 3 months ago. The complete blood count has remained stable. As we discussed yesterday, checking the blood sugars almost daily, at least every other day in the morning when fasting and recording these is needed to monitor the blood sugars, and I would increase the insulin dose you are currently taking by 5 units daily. The wife noted he is taking 30 units daily presently, although it is unclear if that is the case, as I could not find documentation in the chart where that dose was increased to that level. Please confirm the longer acting insulin dose he is taking, and increasing it by 5 units is felt appropriate presently. Follows up today. He has returned to see physical therapy again, and notes has been very helpful His wife was in the waiting room, and I asked she come back with him to help with the history which she did.  She brought his medicines with him which were helpful.   LEswelling, left-sided-noted this complaint on last visit andremains intermittently problematic, but is much better in recent past with PT involvement He has returned to physical therapy with last visit with them  06/18/2020. He denies any chest pain, palpitations, shortness of breath.  DM -uncontrolled He has not seen the endocrinologist, was recommended previously, and I emphasized the importance of seeing the endocrinologist to help get his blood sugars better controlled last visit.That was alsorecommended after the last visit with my colleagueafewweeksprior.  Despite recommendations, has not seen the endocrinologist. Medication regimen-Levemir insulin 25 units daily,(decreaesed by 5 U after last visit and labs instead of increasing by 5 U), glipizide 5 mg daily  Checking sugars at home sporadically, and last visit asked that his wife check them at least every other day in the short-term and write the numbers down, and check them fasting in the morning. She noted she forgot to bring the paper, states running in the higher 100's recently and checks about once daily in recent past. No low sugars noted.  Lab Results  Component Value Date   HGBA1C 10.9 (A) 06/27/2020   HGBA1C 13.1 (H) 05/16/2020   HGBA1C 13.3 (H) 02/13/2020   Lab Results  Component Value Date   MICROALBUR 32.3 01/31/2019   LDLCALC 65 02/13/2020   CREATININE 1.38 (H) 05/16/2020    On a statin, atorvastatin-80 mg daily and last LDL at goal and less than 70, also on an ACE Has appointment with eye doctors on 07/24/2020 schedule  Hypertension:  Currently managed on lisinopril.  Is off the amlodipine Taking medicines regularly.  BP Readings from Last 3 Encounters:  06/27/20 140/80  05/16/20 130/82  03/08/20 122/80    Pt denies CP, SOB, palpitation,increasedHa's, visual disturbances  Anemia: Lab Results  Component Value Date   WBC 5.2 05/16/2020   HGB 12.8 (L) 05/16/2020   HCT 37.8 (L) 05/16/2020   MCV 87.5 05/16/2020   PLT 227 05/16/2020   anemia has been fairly stable  CKD Kidney function improved some on most recent check Lab Results  Component Value Date   CREATININE 1.38 (H) 05/16/2020    BUN 27 (H) 05/16/2020   NA 137 05/16/2020   K 4.4 05/16/2020   CL 105 05/16/2020   CO2 22 05/16/2020    Anticoagulant monitoring-pt denies bleeding sx or concerns, notes his stools can be dark at times, although denies any black, maroon stools, no bleeding per rectum, no other bleeding/easy bruising concerns noted.  To review: Hx Stroke/LEFT Hemiplegia:Stroke in January 2019, affecting LEFT side function; was wearing brace on the LEFT LE.Recurrent stroke as above noted, currently in rehab after. This stroke affected speech. Now anticoagulated per cardiology rec's, remainson statin, he was started on apixaban 5 mg twice daily(d/c ASA and clopidogrel) due to CHA2DS2-VASx score 4 with PAF, stroke x 2, HTN and DM  GERD: Had prior to recent CVA,On omeprazole- 40 mg dailyrecommended (he had bottles for pantoprazole-20 mg, pantoprazole-40 mg, and omeprazole with him today).  He had a lot of the pantoprazole 20 mg tablets, and recommended he take 2 of these daily until that runs out, as concerns noted with medicines going to waste that they just bought.  After that prescription runs out, will then change to the omeprazole 40 mg daily and not take pantoprazole.  The wife was understanding of these instructions today. He does note that his reflux symptoms have better in recent past, and do feel continuing 40 mg of one PPI is appropriate.    Patient Active Problem List   Diagnosis Date Noted  . Lower extremity edema 05/16/2020  . Paroxysmal atrial fibrillation ([place]) 10/26/2019  . Chest pain of uncertain etiology 98/92/1194  . History of CVA (cerebrovascular accident) 09/05/2019  . Hyperglycemia 09/05/2019  . Anemia 09/05/2019  . AKI (acute kidney injury) ([place]) 09/05/2019  . Atypical pneumonia 09/05/2019  . Coagulation disorder ([place]) 06/12/2019  . Gastroesophageal reflux disease   . Hemiplegia and hemiparesis following cerebral infarction affecting left non-dominant side ([place])  11/25/2018  . 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 RBBB 10/15/2018  . Sinus bradycardia 10/15/2018  . Dysphagia, post-stroke   . Neuropathic pain   . Lacunar infarct, acute ([place]) 09/06/2018  . TIA (transient ischemic attack) 09/01/2018  . Microalbuminuria 07/29/2018  . Left leg pain 01/13/2018  . Hyperlipidemia LDL goal <70 02/11/2017  . Recurrent strokes ([place]) 11/10/2016  . Essential hypertension 11/10/2016  . Diabetes ([place]) 11/10/2016  . Tobacco abuse 08/28/2013      Current Outpatient Medications:  .  atorvastatin (LIPITOR) 40 MG tablet, Take by mouth., Disp: , Rfl:  .  baclofen (LIORESAL) 10 MG tablet, Take 1 tablet (10 mg total) by mouth 3 (three) times daily., Disp: 90 each, Rfl: 2 .  Blood Glucose Monitoring Suppl (FIFTY50 GLUCOSE METER 2.0) w/Device KIT, Use as instructed, Disp: , Rfl:  .  ELIQUIS 5 MG TABS tablet, TAKE 1 TABLET BY MOUTH 2 TIMES DAILY, Disp: 180 tablet, Rfl: 1 .  gabapentin (NEURONTIN) 300 MG capsule, Take 2 capsules (600 mg total) by mouth 2 (two) times daily., Disp: 360 capsule, Rfl: 1 .  glipiZIDE (GLUCOTROL) 5 MG tablet, TAKE 1 TABLET BY MOUTH DAILY BEFORE  BREAKFAST, Disp: 90 tablet, Rfl: 1 .  hydrochlorothiazide (HYDRODIURIL) 25 MG tablet, Take 25 mg by mouth daily., Disp: , Rfl:  .  Insulin Pen Needle (NOVOFINE) 32G X 6 MM MISC, 1 each by Does not apply route once a week., Disp: 100 each, Rfl: 1 .  LEVEMIR FLEXTOUCH 100 UNIT/ML FlexPen, Inject into the skin., Disp: , Rfl:  .  lisinopril (ZESTRIL) 40 MG tablet, Take 1 tablet (40 mg total) by mouth daily., Disp: 90 tablet, Rfl: 3 .  Misc. Devices MISC, One pair of Compression stockings  Hemiplegia and hemiparesis following cerebral infarction affecting left non-dominant side ([place]) - Primary Codes: Z61.096 Lower extremity edema  Codes: R60.0 Type 2 diabetes mellitus with hyperglycemia, with long-term current use of insulin (HCC)  Codes: E11.65, Z79.4, Disp: 1 Units, Rfl: 0 .  omeprazole (PRILOSEC) 40 MG capsule,  TAKE 1 CAPSULE BY MOUTH DAILY BEFORE BREAKFAST, Disp: 30 capsule, Rfl: 0 .  pantoprazole (PROTONIX) 40 MG tablet, TAKE 1 TABLET BY MOUTH DAILY BEFORE BREAKFAST, Disp: 30 tablet, Rfl: 3   No Known Allergies   Past Surgical History:  Procedure Laterality Date  . ESOPHAGOGASTRODUODENOSCOPY (EGD) WITH PROPOFOL N/A 04/26/2019   Procedure: ESOPHAGOGASTRODUODENOSCOPY (EGD) WITH PROPOFOL;  Surgeon: Lin Landsman, MD;  Location: [address];  Service: Gastroenterology;  Laterality: N/A;     Family History  Problem Relation Age of Onset  . Hypertension Mother   . Stroke Mother        died @ age 1  . Hypertension Father   . Diabetes Father   . Heart attack Father        died @ 33     Social History   Tobacco Use  . Smoking status: Former Smoker    Packs/day: 1.00    Years: 38.00    Pack years: 38.00    Types: Cigarettes    Start date: 1982  . Smokeless tobacco: Never Used  Substance Use Topics  . Alcohol use: Not Currently    Alcohol/week: 1.0 standard drink    Types: 1 Cans of beer per week    Comment: previously drank but nothing in 1-2 yrs (08/2019).    With staff assistance, above reviewed with the patient today.  ROS: As per HPI, otherwise no specific complaints on a limited and focused system review   Results for orders placed or performed in visit on 06/27/20 (from the past 72 hour(s))  POCT glycosylated hemoglobin (Hb A1C)     Status: Abnormal   Collection Time: 06/27/20  1:47 PM  Result Value Ref Range   Hemoglobin A1C 10.9 (A) 4.0 - 5.6 %   HbA1c POC (<> result, manual entry)     HbA1c, POC (prediabetic range)     HbA1c, POC (controlled diabetic range)       PHQ2/9: Depression screen [facility] 2/9 06/27/2020 05/16/2020 03/08/2020 02/13/2020 11/13/2019  Decreased Interest 0 0 0 1 0  Down, Depressed, Hopeless 0 0 0 1 0  PHQ - 2 Score 0 0 0 2 0  Altered sleeping - - 1 1 3   Tired, decreased energy - - 0 0 0  Change in appetite - - 0 0 0  Feeling bad or  failure about yourself  - - 0 0 0  Trouble concentrating - - 0 0 0  Moving slowly or fidgety/restless - - 0 0 0  Suicidal thoughts - - 0 0 0  PHQ-9 Score - - 1 3 3   Difficult doing work/chores - - Somewhat difficult  Somewhat difficult Not difficult at all  Some recent data might be hidden   PHQ-2/9 Result is neg  Fall Risk: Fall Risk  06/27/2020 05/16/2020 03/08/2020 02/13/2020 11/13/2019  Falls in the past year? 1 1 0 0 0  Number falls in past yr: 1 1 0 0 0  Injury with Fall? 0 1 0 0 0  Follow up - Falls evaluation completed - Falls evaluation completed -      Objective:   Vitals:   06/27/20 1310  BP: 140/80  Pulse: 69  Resp: 16  Temp: (!) 96.9 F (36.1 C)  TempSrc: Oral  SpO2: 98%  Weight: 160 lb 3.2 oz (72.7 kg)  Height: 5' 6"  (1.676 m)    Body mass index is 25.86 kg/m.  Physical Exam   NAD, masked,remains more attentive and interactive,seated in a wheelchair.   Started the visit without his wife present, and he responded appropriately to questions asked, a little difficult to understand at times. HEENT - [place]/AT, sclera anicteric, PERRL, EOMI without nystagmus, conjunctiva noninjected, external auditory canals with minimal cerumen noted bilaterally, not impacted. Neck - supple, no adenopathy, carotids 2+ and equal bilaterally without bruits Car - RRR without m/g/r Pulm- RR and effort normal at rest, CTA without wheeze or rales Abd - soft, NTdiffuselywithno focal epigastric discomfort, Ext -no right lower extremity edema, trace more diffuse left lower extremity edema persists with no focal calf tenderness, no increased erythema or increased warmth of the calf area.   Neuro/psychiatricresponded appropriately to questions asked, Alert with speech at times more difficult to understand Mild left-sided weakness without significant change. In a wheelchair in the office.  Has a cane to use when ambulates for support  Results for  orders placed or performed in visit on 06/27/20  POCT glycosylated hemoglobin (Hb A1C)  Result Value Ref Range   Hemoglobin A1C 10.9 (A) 4.0 - 5.6 %   HbA1c POC (<> result, manual entry)     HbA1c, POC (prediabetic range)     HbA1c, POC (controlled diabetic range)     A1C - 10.9     Assessment & Plan:    1. Type 2 diabetes mellitus with hyperglycemia, with long-term current use of insulin (HCC) I requested an increase in his insulin dose by 5 units after reviewing the lab results obtained from his last visit, although his insulin was decreased by 5 units instead. His A1c is improved today, but still markedly elevated. Felt best to increase his insulin back to 30 units daily, and if his fasting blood sugar checks at home by the wife remain in the high 100s or higher, (noted greater than 150 and after checking for 2 to 3 weeks on the 30 unit insulin dose) recommended she increase the insulin to 33 units daily. Previously had emphasized the importance of getting endocrinology involved, especially noting how high his blood sugar was on his last couple lab checks.  His A1c's have also been very high.  Asked Melissa in the recent past to help contact him with endocrinology to get an appointment arranged, although endocrine input has not been obtained to date The wife was in agreement with getting him to endocrinology, and a referral was placed again today to help with that. Again emphasized the importance of better blood sugar control today, and do feel having endocrine's input will be helpful with this. The wife will continue to check his blood sugars at home, at least in the morning fasting, and recommended checking if there is  any concerning symptoms arising as well. Keep the appointment with ophthalmology planned on 12/8.  2. Essential hypertension His blood pressure remains controlled, with a systolic reading borderline today.   Continue the ACE inhibitor felt best presently, and he is now  off the amlodipine product. Continue to monitor.  3. Mixed hyperlipidemia Continuing the statin product presently Last lipids obtained in June, with LDL cholesterol at goal which is less than 70.  4. Stage 3a chronic kidney disease ([place]) Kidney function improved some on most recent check. Continuing to monitor. Noted the importance of getting better sugar control to help the kidneys.  5. Recurrent strokes (HCC) 6. Paroxysmal atrial fibrillation ([place]) Continue with physical therapy Anticoagulated - now on Eliquis with cardiology input Continuing to monitor.  7. Anemia, unspecified type Blood counts have remained stable on recent checks.  8. Monitoring for anticoagulant use Continuing to monitor with no recent bleeding concerns noted.  9. Hemiplegia and hemiparesis following cerebral infarction affecting left non-dominant side (HCC) Status post prior CVA. Has returned to physical therapy recently, and continues. He does think the physical therapy has been very helpful.  10. Lower extremity edema Stable in recent past, and likely a prior CVA and limited mobility after contributing with some venous stasis concerns noted.  No concerns for DVT. Continue with physical therapy presently. Also compression stockings recommended previously to help.  11. Gastroesophageal reflux disease without esophagitis Continues the PPI, and discussed only needing to take one of the PPIs.  His reflux symptoms have been better in the recent past. We will continue a PPI, with the details as noted above in the HPI. Also the importance of reflux precautions continue to be encouraged.  Information was provided in the AVS on a previous visit to help as well  12.  Need for immunization against influenza Flu shot provided today   He has a follow-up already scheduled for the very end of January, can follow-up sooner as needed. He was informed that the follow-up visit will be with a different provider as I  will be leaving the practice prior to that plan follow-up.    Towanda Malkin, MD 06/27/20 2:10 PM

## 2020-06-27 ENCOUNTER — Ambulatory Visit (INDEPENDENT_AMBULATORY_CARE_PROVIDER_SITE_OTHER): Payer: Medicaid Other | Admitting: Internal Medicine

## 2020-06-27 ENCOUNTER — Encounter: Payer: Self-pay | Admitting: Internal Medicine

## 2020-06-27 VITALS — BP 140/80 | HR 69 | Temp 96.9°F | Resp 16 | Ht 66.0 in | Wt 160.2 lb

## 2020-06-27 DIAGNOSIS — Z5181 Encounter for therapeutic drug level monitoring: Secondary | ICD-10-CM

## 2020-06-27 DIAGNOSIS — Z7901 Long term (current) use of anticoagulants: Secondary | ICD-10-CM

## 2020-06-27 DIAGNOSIS — D649 Anemia, unspecified: Secondary | ICD-10-CM

## 2020-06-27 DIAGNOSIS — I639 Cerebral infarction, unspecified: Secondary | ICD-10-CM | POA: Diagnosis not present

## 2020-06-27 DIAGNOSIS — E1165 Type 2 diabetes mellitus with hyperglycemia: Secondary | ICD-10-CM | POA: Diagnosis not present

## 2020-06-27 DIAGNOSIS — I480 Paroxysmal atrial fibrillation: Secondary | ICD-10-CM

## 2020-06-27 DIAGNOSIS — N182 Chronic kidney disease, stage 2 (mild): Secondary | ICD-10-CM | POA: Insufficient documentation

## 2020-06-27 DIAGNOSIS — N1831 Chronic kidney disease, stage 3a: Secondary | ICD-10-CM | POA: Diagnosis not present

## 2020-06-27 DIAGNOSIS — Z23 Encounter for immunization: Secondary | ICD-10-CM

## 2020-06-27 DIAGNOSIS — E782 Mixed hyperlipidemia: Secondary | ICD-10-CM | POA: Diagnosis not present

## 2020-06-27 DIAGNOSIS — I69354 Hemiplegia and hemiparesis following cerebral infarction affecting left non-dominant side: Secondary | ICD-10-CM

## 2020-06-27 DIAGNOSIS — Z794 Long term (current) use of insulin: Secondary | ICD-10-CM

## 2020-06-27 DIAGNOSIS — R600 Localized edema: Secondary | ICD-10-CM

## 2020-06-27 DIAGNOSIS — I10 Essential (primary) hypertension: Secondary | ICD-10-CM | POA: Diagnosis not present

## 2020-06-27 DIAGNOSIS — K219 Gastro-esophageal reflux disease without esophagitis: Secondary | ICD-10-CM

## 2020-06-27 LAB — POCT GLYCOSYLATED HEMOGLOBIN (HGB A1C): Hemoglobin A1C: 10.9 % — AB (ref 4.0–5.6)

## 2020-06-27 NOTE — Patient Instructions (Addendum)
Referral to endocrinology placed today  Return to taking 30 Units of insulin presently. If after 2-3 weeks, fasting sugars at home in the morning are higher than 150, increase the insulin to 33 units and hope to have endocrine input involved to help very soon.

## 2020-07-02 ENCOUNTER — Ambulatory Visit: Payer: Medicaid Other | Admitting: Physical Therapy

## 2020-07-02 ENCOUNTER — Encounter: Payer: Self-pay | Admitting: Physical Therapy

## 2020-07-02 ENCOUNTER — Other Ambulatory Visit: Payer: Self-pay

## 2020-07-02 DIAGNOSIS — R262 Difficulty in walking, not elsewhere classified: Secondary | ICD-10-CM | POA: Diagnosis not present

## 2020-07-02 DIAGNOSIS — R278 Other lack of coordination: Secondary | ICD-10-CM

## 2020-07-02 DIAGNOSIS — R2689 Other abnormalities of gait and mobility: Secondary | ICD-10-CM | POA: Diagnosis not present

## 2020-07-02 DIAGNOSIS — I69354 Hemiplegia and hemiparesis following cerebral infarction affecting left non-dominant side: Secondary | ICD-10-CM

## 2020-07-02 DIAGNOSIS — R2681 Unsteadiness on feet: Secondary | ICD-10-CM | POA: Diagnosis not present

## 2020-07-02 DIAGNOSIS — Z9181 History of falling: Secondary | ICD-10-CM

## 2020-07-02 DIAGNOSIS — M6281 Muscle weakness (generalized): Secondary | ICD-10-CM | POA: Diagnosis not present

## 2020-07-02 NOTE — Therapy (Signed)
[facility] [address] [place], [place], [address] Phone: [phone]   Fax:  [phone]  Physical Therapy Treatment  Patient Details  Name: Evan OTTING Sr. MRN: [id number] Date of Birth: 02-26-1956 Referring Provider (PT): Lebron Conners MD   Encounter Date: 07/02/2020   PT End of Session - 07/02/20 1711    Visit Number 8    Number of Visits 9    Date for PT Re-Evaluation 07/03/20    Authorization - Visit Number 8    Authorization - Number of Visits 10    PT Start Time 5427    PT Stop Time 1513    PT Time Calculation (min) 42 min    Equipment Utilized During Treatment Gait belt    Activity Tolerance Patient tolerated treatment well    Behavior During Therapy WFL for tasks assessed/performed           Past Medical History:  Diagnosis Date  . Carotid arterial disease ([place])    a. 08/2018 Carotid U/S: min-mod RICA atherosclerosis w/o hemodynamically significant stenosis. Nl LICA.  Marland Kitchen Chest pain    a. 08/2019 MV: EF 55%. No ischemia. Diaph attenuation noted.  . Diabetes 1.5, managed as type 2 ([place])   . Diastolic dysfunction    a. 08/2018 Echo: EF 65%. No rwma. Gr1 DD. Mild MR.  . Diastolic dysfunction    a. 08/2019 Echo: EF 55-60%, Gr1 DD. No rwma. Mild MR. RVSP 37.26mHg.  .Marland KitchenHypercholesterolemia   . Hypertension   . Left hemiparesis ([place]    a. Ambulated w/ Cane. Limited use of LUE.  .Marland KitchenPAF (paroxysmal atrial fibrillation) ([facility]    a. 10/2019 Event Monitor: PAF; b. CHA2DS2VASc = 5-->Eliquis.  . Pain in both feet   . Poorly controlled diabetes mellitus ([place]    a. 04/2019 A1c 13.8.  .Marland KitchenRecurrent strokes ([facility]    a. 10/2016 MRI/A: Acute 514mR thalamic infarct, ? subacute infarct of R corona radiata; b. 08/2017 MRI/A: Acute 40m440materal L thalamic infarct. Other more remote lacunar infarcts of thalami bilat. Small vessel dzs; c. 08/2018 MRI/A: Acute lacunar infarct of the post limb of R internal capsule; d. 08/2019 MRI  Acute CVA of L paramedian pons adn R cerebellar hemisphere.  . Tobacco abuse     Past Surgical History:  Procedure Laterality Date  . ESOPHAGOGASTRODUODENOSCOPY (EGD) WITH PROPOFOL N/A 04/26/2019   Procedure: ESOPHAGOGASTRODUODENOSCOPY (EGD) WITH PROPOFOL;  Surgeon: VanLin LandsmanD;  [facility];  Laterality: N/A;    There were no vitals filed for this visit.   Subjective Assessment - 07/02/20 1519    Subjective Pt reports he has not received [facility]. No falls and compliant with HEP.    Pertinent History Pertinent history and PMH: HTN, uncontrolled DM, HLD, R basal ganglia stroke on 08/2018 and L pointine and R cerebellar stroke 09/07/19, sinus bradycardia, peripheral neuropat    Patient Stated Goals to get back to fishing, walking better, balance.    Currently in Pain? No/denies    Pain Score 0-No pain    Pain Onset More than a month ago           Neuro Re-Ed:    Walking in hallway practicing fast and slow changes in gait speed. CGA+1, 64'x2  Walking in hallway with vertical and horizontal head turns. CGA+1. 64'x2 /direction. Single LOB with horizontal turns requiring Pt assist to correct  Walking 50' dual tasking with basic math  addition, substraction and multiplication. Decreased gait speed with math.  Alternating cone taps and stepping over small hurdles: x4. Decreased R lat lean and improve L hip flexion with min verbal cuing. CGA+1  Side steps on long airex pad performing cone taps in // bars with single UE assist. CGA+1. X3. Increased R lat being on unstable surface.  Walking in // bars with no AD and no UE support CGA+1. Education on equal step lengths, wider BOS, and improved LLE heel strike. x4    PT Education - 07/02/20 1711    Education Details form/technique with exercise.    Person(s) Educated Patient    Methods Explanation;Demonstration;Tactile cues;Verbal cues    Comprehension Verbalized  understanding;Returned demonstration            PT Short Term Goals - 11/22/19 1718      PT SHORT TERM GOAL #1   Title Patient will be independent with HEP for improved therapeutic gains and self-management.    Baseline 2/10: give next session 4/7: HEP compliant    Time 4    Period Weeks    Status Partially Met    Target Date 10/25/19             PT Long Term Goals - 06/11/20 1729      PT LONG TERM GOAL #1   Title Pt will increase Berg Balance to 50/56 to demonstrate clinically significant improvement in balance decreasing pt's risk for falls during functional activities.    Baseline 10/20: 39/56    Time 4    Period Weeks    Status New      PT LONG TERM GOAL #2   Title Pt will improve 10 m gait speed to > 0.5 m/s  with LRAD to improve safe community ambulation.    Baseline 10/20: 0.34 m/s with SPC and SBA+1    Time 4    Period Weeks    Status New      PT LONG TERM GOAL #3   Title Pt will improve 5xSTS to < 12 sec with no RUE support to demonstrate clinically significant improvement in BLE strength to improve STS t/f's like standing from the toilet.    Baseline 10/20: 14.48 sec with RUE support on hand rail.    Time 4    Period Weeks    Status New      PT LONG TERM GOAL #4   Title Pt will improve DGI by a minimum of 5 points to demonstrate clinically significant improvement and safety with community ambulation tasks to decrease risk of falls.    Baseline 10/20: 10/22    Time 4    Period Weeks    Status New      PT LONG TERM GOAL #5   Title Pt will improve FOTO score to target amount to demonstrate perceived improvements in ability to complete ADL's.    Baseline 10/20: PErform next session.    Time 4    Period Weeks    Status New      Additional Long Term Goals   Additional Long Term Goals Yes      PT LONG TERM GOAL #6   Title Pt will improve TUG time with donned AFO and LSC by 3 sec to display clinically significant improvement in safe ambulation with AD.     Baseline 10/26: 36.37 sec    Time 4    Period Weeks    Status New    Target Date 07/03/20  Plan - 07/02/20 1712    Clinical Impression Statement Pt displayed most difficulty with horizontal head turns, vertical head turns, and dual tasking with dynamic balance with single LOB with horizontal head turns that required PT assist to correct. Reducaed lat lean with L hip flexion tasks involving R SLS tasks. Pt goals and need for future therapy reassessed next session.    Personal Factors and Comorbidities Age;Comorbidity 3+;Education;Finances;Past/Current Experience;Fitness;Transportation    Comorbidities HTN, uncontrolled DM, HLD, R basal ganglia stroke on 08/2018 and L pointine and R cerebellar stroke 09/07/19, sinus bradycardia, peripheral neuropathy    Examination-Activity Limitations Bathing;Bed Mobility;Dressing;Transfers;Squat;Lift;Locomotion Level;Stairs;Reach Overhead;Carry;Stand;Sit    Examination-Participation Restrictions Community Activity;Interpersonal Relationship;Yard Work    [employer]chant [employer] officer Evolving/Moderate complexity    Clinical Decision Making Moderate    Rehab Potential Fair    PT Frequency 2x / week    PT Duration 4 weeks    PT Treatment/Interventions ADLs/Self Care Home Management;Aquatic Therapy;Electrical Stimulation;Moist Heat;DME Instruction;Gait training;Stair training;Functional mobility training;Therapeutic activities;Therapeutic exercise;Balance training;Neuromuscular re-education;Patient/family education;Orthotic Fit/Training;Dry needling;Manual techniques    PT Next Visit Plan Dynamic gait with quad cane.    PT Home Exercise Plan HEP: standing hip marches/abd, ankle DF/PF in sitting/supine, passive L elbow stretch for tone    Consulted and Agree with Plan of Care Patient    Family Member Consulted wife           Patient will benefit from skilled therapeutic intervention in order to improve the following deficits and  impairments:  Abnormal gait, Decreased coordination, Decreased range of motion, Difficulty walking, Impaired tone, Decreased endurance, Impaired UE functional use, Decreased activity tolerance, Impaired perceived functional ability, Decreased balance, Impaired flexibility, Decreased cognition, Decreased mobility, Decreased strength, Increased edema, Postural dysfunction  Visit Diagnosis: Muscle weakness (generalized)  Unsteadiness on feet  Hemiplegia and hemiparesis following cerebral infarction affecting left non-dominant side (HCC)  Other abnormalities of gait and mobility  At risk for falls  Difficulty in walking, not elsewhere classified  Other lack of coordination     Problem List Patient Active Problem List   Diagnosis Date Noted  . Stage 3a chronic kidney disease ([place]) 06/27/2020  . Monitoring for anticoagulant use 06/27/2020  . Lower extremity edema 05/16/2020  . Paroxysmal atrial fibrillation ([place]) 10/26/2019  . Chest pain of uncertain etiology 21/06/7355  . History of CVA (cerebrovascular accident) 09/05/2019  . Hyperglycemia 09/05/2019  . Anemia 09/05/2019  . AKI (acute kidney injury) ([place]) 09/05/2019  . Atypical pneumonia 09/05/2019  . Coagulation disorder ([place]) 06/12/2019  . Gastroesophageal reflux disease   . Hemiplegia and hemiparesis following cerebral infarction affecting left non-dominant side ([place]) 11/25/2018  . 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 RBBB 10/15/2018  . Sinus bradycardia 10/15/2018  . Dysphagia, post-stroke   . Neuropathic pain   . Lacunar infarct, acute ([place]) 09/06/2018  . TIA (transient ischemic attack) 09/01/2018  . Microalbuminuria 07/29/2018  . Left leg pain 01/13/2018  . Hyperlipidemia LDL goal <70 02/11/2017  . Recurrent strokes ([place]) 11/10/2016  . Essential hypertension 11/10/2016  . Diabetes ([place]) 11/10/2016  . Tobacco abuse 08/28/2013   Pura Spice, PT, DPT # [id number], SPT 07/02/2020, 5:15 PM  [facility] [address] [place], [place], [address] Phone: [phone]   Fax:  [phone]  Name: [name] MRN: [id number] Date of Birth: Sep 14, 1955

## 2020-07-04 ENCOUNTER — Other Ambulatory Visit: Payer: Self-pay

## 2020-07-04 ENCOUNTER — Encounter: Payer: Self-pay | Admitting: Physical Therapy

## 2020-07-04 ENCOUNTER — Ambulatory Visit: Payer: Medicaid Other | Admitting: Physical Therapy

## 2020-07-04 DIAGNOSIS — M6281 Muscle weakness (generalized): Secondary | ICD-10-CM | POA: Diagnosis not present

## 2020-07-04 DIAGNOSIS — R2689 Other abnormalities of gait and mobility: Secondary | ICD-10-CM

## 2020-07-04 DIAGNOSIS — Z9181 History of falling: Secondary | ICD-10-CM

## 2020-07-04 DIAGNOSIS — R262 Difficulty in walking, not elsewhere classified: Secondary | ICD-10-CM

## 2020-07-04 DIAGNOSIS — R278 Other lack of coordination: Secondary | ICD-10-CM | POA: Diagnosis not present

## 2020-07-04 DIAGNOSIS — R2681 Unsteadiness on feet: Secondary | ICD-10-CM

## 2020-07-04 DIAGNOSIS — I69354 Hemiplegia and hemiparesis following cerebral infarction affecting left non-dominant side: Secondary | ICD-10-CM

## 2020-07-04 NOTE — Therapy (Signed)
[facility] [address] [place], [place], [address] Phone: [phone]   Fax:  [phone]  Physical Therapy Treatment  Patient Details  Name: Evan KREISER Sr. MRN: [id number] Date of Birth: [birth year] Referring Provider (PT): Lebron Conners MD   Encounter Date: 07/04/2020   PT End of Session - 07/04/20 1524    Visit Number 9    Number of Visits 17    Date for PT Re-Evaluation 08/01/20    Authorization - Visit Number 1    Authorization - Number of Visits 10    PT Start Time 1430    PT Stop Time 3254    PT Time Calculation (min) 48 min    Equipment Utilized During Treatment Gait belt    Activity Tolerance Patient tolerated treatment well    Behavior During Therapy WFL for tasks assessed/performed           Past Medical History:  Diagnosis Date  . Carotid arterial disease ([place])    a. 08/2018 Carotid U/S: min-mod RICA atherosclerosis w/o hemodynamically significant stenosis. Nl LICA.  Marland Kitchen Chest pain    a. 08/2019 MV: EF 55%. No ischemia. Diaph attenuation noted.  . Diabetes 1.5, managed as type 2 ([place])   . Diastolic dysfunction    a. 08/2018 Echo: EF 65%. No rwma. Gr1 DD. Mild MR.  . Diastolic dysfunction    a. 08/2019 Echo: EF 55-60%, Gr1 DD. No rwma. Mild MR. RVSP 37.89mHg.  .Marland KitchenHypercholesterolemia   . Hypertension   . Left hemiparesis ([place]    a. Ambulated w/ Cane. Limited use of LUE.  .Marland KitchenPAF (paroxysmal atrial fibrillation) ([place]    a. 10/2019 Event Monitor: PAF; b. CHA2DS2VASc = 5-->Eliquis.  . Pain in both feet   . Poorly controlled diabetes mellitus ([place]    a. 04/2019 A1c 13.8.  .Marland KitchenRecurrent strokes ([place]    a. 10/2016 MRI/A: Acute 5769mR thalamic infarct, ? subacute infarct of R corona radiata; b. 08/2017 MRI/A: Acute 69m95materal L thalamic infarct. Other more remote lacunar infarcts of thalami bilat. Small vessel dzs; c. 08/2018 MRI/A: Acute lacunar infarct of the post limb of R internal capsule; d. 08/2019 MRI  Acute CVA of L paramedian pons adn R cerebellar hemisphere.  . Tobacco abuse     Past Surgical History:  Procedure Laterality Date  . ESOPHAGOGASTRODUODENOSCOPY (EGD) WITH PROPOFOL N/A 04/26/2019   Procedure: ESOPHAGOGASTRODUODENOSCOPY (EGD) WITH PROPOFOL;  Surgeon: VanLin LandsmanD;  [facility];  Laterality: N/A;    There were no vitals filed for this visit.   Subjective Assessment - 07/04/20 1522    [place] No falls and reports not checking his blood sugar today. Pt educated importance of monitoring blood sugar as MD requested due to elevated levels.    Pertinent History Pertinent history and PMH: HTN, uncontrolled DM, HLD, R basal ganglia stroke on 08/2018 and L pointine and R cerebellar stroke 09/07/19, sinus bradycardia, peripheral neuropat    Patient Stated Goals to get back to fishing, walking better, balance.    Currently in Pain? No/denies    Pain Onset More than a month ago    Multiple Pain Sites No              Vitals Prior:   BP: 138/75 mm Hg  HR: 77 BPM  SPO2: 100%   Neuro Re-Ed:   Goal Reassessment today for deciding future POC for future  insurance Auth.   TUG: 30.3 sec with quad cane and SBA+1   5xSTS 11.03 sec with RUE support   10 m gait speed with quad cane: 0.39 sec with quad cane and SBA+1   Berg Balance Scale: 45/56  There.ex:   Nu-Step L4 for 6 min. LE's only. Not billed.     PT Education - 07/04/20 1523    Education Details Future POC. Monitoring blood sugar.    Person(s) Educated Patient    Methods Explanation    Comprehension Verbalized understanding            PT Short Term Goals - 11/22/19 1718      PT SHORT TERM GOAL #1   Title Patient will be independent with HEP for improved therapeutic gains and self-management.    Baseline 2/10: give next session 4/7: HEP compliant    Time 4    Period Weeks    Status Partially Met    Target Date  10/25/19             PT Long Term Goals - 07/04/20 1524      PT LONG TERM GOAL #1   Title Pt will increase Berg Balance to 50/56 to demonstrate clinically significant improvement in balance decreasing pt's risk for falls during functional activities.    Baseline 10/20: 39/56; 11/18: 45/56    Time 4    Period Weeks    Status Partially Met    Target Date 08/01/20      PT LONG TERM GOAL #2   Title Pt will improve 10 m gait speed to > 0.5 m/s  with LRAD to improve safe community ambulation.    Baseline 10/20: 0.34 m/s with SPC and SBA+1; 11/18: 0.39 m/s with quad cane and SBA+1    Time 4    Period Weeks    Status Not Met    Target Date 08/01/20      PT LONG TERM GOAL #3   Title Pt will improve 5xSTS to < 12 sec with no RUE support to demonstrate clinically significant improvement in BLE strength to improve STS t/f's like standing from the toilet.    Baseline 10/20: 14.48 sec with RUE support on hand rail.; 11/18:11.03 sec with RUE support.    Time 4    Period Weeks    Status Partially Met    Target Date 08/01/20      PT LONG TERM GOAL #4   Title Pt will improve DGI by a minimum of 5 points to demonstrate clinically significant improvement and safety with community ambulation tasks to decrease risk of falls.    Baseline 10/20: 10/22; Deferred to next session due to time    Time 4    Period Weeks    Status On-going    Target Date 08/01/20      PT LONG TERM GOAL #5   Title Pt will improve FOTO score to target amount to demonstrate perceived improvements in ability to complete ADL's.    Baseline 10/20: PErform next session.; 11/18: Still needs to be performed.    Time 4    Period Weeks    Status On-going    Target Date 08/01/20      PT LONG TERM GOAL #6   Title Pt will improve TUG time with donned AFO and LSC by 3 sec to display clinically significant improvement in safe ambulation with AD.    Baseline 10/26: 36.37 sec; 11/18: 30.3 sec with AFO and quad cane.    Time  4     Period Weeks    Status Achieved    Target Date 07/04/20                 Plan - 07/04/20 1528    Clinical Impression Statement Pt making excellent progress towards goals. DGI and FOTO goals deferred today to next session due to limited time in therapy. Pt achieved TUG goal to 30.3 sec with quad cane demonstrating improved safety with STS transfer and turning mobility. Pt also partially achieved 5xSTS goal to 11.03 sec with RUE support. Although time was below 12 sec, pt still requires RUE support in order to perform. Pt improved Berg balance scale to 45 with a goal of 50 displaying pt is at decreased risk of falling. Pt also improved gait speed with quad cane to 0.39 m/s with a goal of 0.5 m/s to dmeonstrate redcued risk of falls. Although pt has made significant progress in goals, pt still demonstrates limited LE strength and dynamic balance stil placing pt at risk of falls. Pt also educated on monitoring blood sugar as MD requests due to elevated levels. Pt can continue to benefit from skilled PT services to improve functional mobility and reduce risk of falls.    Personal Factors and Comorbidities Age;Comorbidity 3+;Education;Finances;Past/Current Experience;Fitness;Transportation    Comorbidities HTN, uncontrolled DM, HLD, R basal ganglia stroke on 08/2018 and L pointine and R cerebellar stroke 09/07/19, sinus bradycardia, peripheral neuropathy    Examination-Activity Limitations Bathing;Bed Mobility;Dressing;Transfers;Squat;Lift;Locomotion Level;Stairs;Reach Overhead;Carry;Stand;Sit    Examination-Participation Restrictions Community Activity;Interpersonal Relationship;Yard Work    Stability/Clinical Decision Making Evolving/Moderate complexity    Clinical Decision Making Moderate    Rehab Potential Fair    PT Frequency 2x / week    PT Duration 4 weeks    PT Treatment/Interventions ADLs/Self Care Home Management;Aquatic Therapy;Electrical Stimulation;Moist Heat;DME Instruction;Gait  training;Stair training;Functional mobility training;Therapeutic activities;Therapeutic exercise;Balance training;Neuromuscular re-education;Patient/family education;Orthotic Fit/Training;Dry needling;Manual techniques    PT Next Visit Plan Reassess DGI and perform FOTO.    PT Home Exercise Plan HEP: standing hip marches/abd, ankle DF/PF in sitting/supine, passive L elbow stretch for tone    Consulted and Agree with Plan of Care Patient    Family Member Consulted wife           Patient will benefit from skilled therapeutic intervention in order to improve the following deficits and impairments:  Abnormal gait, Decreased coordination, Decreased range of motion, Difficulty walking, Impaired tone, Decreased endurance, Impaired UE functional use, Decreased activity tolerance, Impaired perceived functional ability, Decreased balance, Impaired flexibility, Decreased cognition, Decreased mobility, Decreased strength, Increased edema, Postural dysfunction  Visit Diagnosis: Muscle weakness (generalized)  Unsteadiness on feet  Hemiplegia and hemiparesis following cerebral infarction affecting left non-dominant side (HCC)  Other abnormalities of gait and mobility  At risk for falls  Difficulty in walking, not elsewhere classified     Problem List Patient Active Problem List   Diagnosis Date Noted  . Stage 3a chronic kidney disease (HCC) 06/27/2020  . Monitoring for anticoagulant use 06/27/2020  . Lower extremity edema 05/16/2020  . Paroxysmal atrial fibrillation (HCC) 10/26/2019  . Chest pain of uncertain etiology 09/05/2019  . History of CVA (cerebrovascular accident) 09/05/2019  . Hyperglycemia 09/05/2019  . Anemia 09/05/2019  . AKI (acute kidney injury) (HCC) 09/05/2019  . Atypical pneumonia 09/05/2019  . Coagulation disorder (HCC) 06/12/2019  . Gastroesophageal reflux disease   . Hemiplegia and hemiparesis following cerebral infarction affecting left non-dominant side (HCC)  11/25/2018  . 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 RBBB   10/15/2018  . Sinus bradycardia 10/15/2018  . Dysphagia, post-stroke   . Neuropathic pain   . Lacunar infarct, acute ([place]) 09/06/2018  . TIA (transient ischemic attack) 09/01/2018  . Microalbuminuria 07/29/2018  . Left leg pain 01/13/2018  . Hyperlipidemia LDL goal <70 02/11/2017  . Recurrent strokes ([place]) 11/10/2016  . Essential hypertension 11/10/2016  . Diabetes ([place]) 11/10/2016  . Tobacco abuse 08/28/2013   Pura Spice, PT, DPT # [id number] SPT 07/05/2020, 9:59 AM  [facility] [address] [place], [place], [address] Phone: [phone]   Fax:  [phone]  Name: Evan MARTINE Sr. MRN: [id number] Date of Birth: 08-29-1955

## 2020-07-08 ENCOUNTER — Telehealth: Payer: Self-pay | Admitting: Internal Medicine

## 2020-07-08 ENCOUNTER — Ambulatory Visit: Payer: Medicaid Other

## 2020-07-08 NOTE — Telephone Encounter (Signed)
PTs wife calling back to F/UP

## 2020-07-08 NOTE — Telephone Encounter (Signed)
Please help him get the lancets and test strips he needs. Can do a verbal order if need to.  Thanks.

## 2020-07-08 NOTE — Telephone Encounter (Signed)
Evan Townsend is calling from [facility] is regarding the patient requesting a script for test strips and lancets. Pharmacy does not have a script for these. Patient states that he uses [facility] and needing coresponding lancets. Please advise CB- [phone] Fax- [phone]

## 2020-07-09 ENCOUNTER — Other Ambulatory Visit: Payer: Self-pay | Admitting: Internal Medicine

## 2020-07-09 ENCOUNTER — Other Ambulatory Visit: Payer: Self-pay | Admitting: Emergency Medicine

## 2020-07-09 DIAGNOSIS — E1165 Type 2 diabetes mellitus with hyperglycemia: Secondary | ICD-10-CM

## 2020-07-09 MED ORDER — BLOOD GLUCOSE MONITORING SUPPL KIT: PACK | 0 refills | Status: DC

## 2020-07-09 NOTE — Telephone Encounter (Signed)
PT need a refill / he's complete out  Insulin Pen Needle (NOVOFINE) 32G X 6 MM MISC [[id number]]  LEVEMIR FLEXTOUCH 100 UNIT/ML FlexPen [[id number]]  [facility] [place], [place] - [address]  [address], [place] [address]  Phone:  [phone] Fax:  [phone]

## 2020-07-09 NOTE — Telephone Encounter (Signed)
Requested medication (s) are due for refill today: yes  Requested medication (s) are on the active medication list: yes  Last refill: 06/03/2020  Future visit scheduled: no  Notes to clinic: medication was filled by a historical provider Review for refill Patient also needs a new script for NOVOFINE) 32G X 6 MM MISC    Requested Prescriptions  Pending Prescriptions Disp Refills   LEVEMIR FLEXTOUCH 100 UNIT/ML FlexPen 15 mL     Sig: Inject into the skin.      There is no refill protocol information for this order

## 2020-07-10 ENCOUNTER — Other Ambulatory Visit: Payer: Self-pay

## 2020-07-15 ENCOUNTER — Ambulatory Visit: Payer: Medicaid Other

## 2020-07-15 NOTE — Telephone Encounter (Signed)
[facility] and gave verbal order for Lancets and Test Strips.

## 2020-07-16 ENCOUNTER — Other Ambulatory Visit: Payer: Self-pay | Admitting: Internal Medicine

## 2020-07-16 DIAGNOSIS — M792 Neuralgia and neuritis, unspecified: Secondary | ICD-10-CM

## 2020-07-16 DIAGNOSIS — M79605 Pain in left leg: Secondary | ICD-10-CM

## 2020-07-16 DIAGNOSIS — M542 Cervicalgia: Secondary | ICD-10-CM

## 2020-07-16 NOTE — Telephone Encounter (Signed)
Victorino Dike from [employer] confirmed test strips and  Lancets are ready for pick up but insurance will not cover the meter which cost 12.59 out of pocket, stating the insurance will only cover a meter every so often. Mrs. Marsala made aware and voice understanding.   [facility] [place], [place] [address] Phone:  [phone]  Fax:  [phone]

## 2020-07-17 ENCOUNTER — Ambulatory Visit: Payer: Medicaid Other | Attending: Internal Medicine

## 2020-07-17 DIAGNOSIS — R2681 Unsteadiness on feet: Secondary | ICD-10-CM | POA: Insufficient documentation

## 2020-07-17 DIAGNOSIS — M6281 Muscle weakness (generalized): Secondary | ICD-10-CM | POA: Insufficient documentation

## 2020-07-22 ENCOUNTER — Ambulatory Visit: Payer: Medicaid Other

## 2020-07-22 NOTE — Patient Instructions (Incomplete)
Progress Note (don't need to update goals, just updated 07/04/20)  Neuro Re-Ed:               Walking in hallway practicing fast and slow changes in gait speed. CGA+1, 64'x2             Walking in hallway with vertical and horizontal head turns. CGA+1. 64'x2 /direction. Single LOB with horizontal turns requiring Pt assist to correct             Walking 50' dual tasking with basic math addition, substraction and multiplication. Decreased gait speed with math.             Alternating cone taps and stepping over small hurdles: x4. Decreased R lat lean and improve L hip flexion with min verbal cuing. CGA+1             Side steps on long airex pad performing cone taps in // bars with single UE assist. CGA+1. X3. Increased R lat being on unstable surface.             Walking in // bars with no AD and no UE support CGA+1. Education on equal step lengths, wider BOS, and improved LLE heel strike. x4   Pt educated throughout session about proper posture and technique with exercises. Improved exercise technique, movement at target joints, use of target muscles after min to mod verbal, visual, tactile cues.    Pt making excellent progress towards goals. DGI and FOTO goals deferred today to next session due to limited time in therapy. Pt achieved TUG goal to 30.3 sec with quad cane demonstrating improved safety with STS transfer and turning mobility. Pt also partially achieved 5xSTS goal to 11.03 sec with RUE support. Although time was below 12 sec, pt still requires RUE support in order to perform. Pt improved Berg balance scale to 45 with a goal of 50 displaying pt is at decreased risk of falling. Pt also improved gait speed with quad cane to 0.39 m/s with a goal of 0.5 m/s to dmeonstrate redcued risk of falls. Although pt has made significant progress in goals, pt still demonstrates limited LE strength and dynamic balance stil placing pt at risk of falls. Pt also educated on monitoring blood sugar as MD  requests due to elevated levels. Pt can continue to benefit from skilled PT services to improve functional mobility and reduce risk of falls.

## 2020-07-24 ENCOUNTER — Ambulatory Visit: Payer: Medicaid Other

## 2020-07-24 ENCOUNTER — Ambulatory Visit: Payer: Self-pay

## 2020-07-24 ENCOUNTER — Other Ambulatory Visit: Payer: Self-pay

## 2020-07-24 VITALS — BP 200/94 | HR 61

## 2020-07-24 DIAGNOSIS — R2681 Unsteadiness on feet: Secondary | ICD-10-CM

## 2020-07-24 DIAGNOSIS — M6281 Muscle weakness (generalized): Secondary | ICD-10-CM

## 2020-07-24 NOTE — Telephone Encounter (Signed)
Physical therapist Barbara Cower from [facility] called to report patient has BP 200/94  HR 61 O2 100%. He states that the patient is having no symptoms but has Hx of multiple strokes and som noncompliance with medications. Patient has told Barbara Cower that he has taken his morning medication. Per protocol patient should be seen today. Per Barbara Cower he would be unable to get to the office today. Patient will be asked to go to UC next door for evaluation. Barbara Cower has been communicating via messaging with Dr Dorris Fetch and will alert Dr Dorris Fetch if patient refuses care Reason for Disposition . [1] Systolic BP  >= 200 OR Diastolic >= 120  AND [2] having NO cardiac or neurologic symptoms  Answer Assessment - Initial Assessment Questions 1. BLOOD PRESSURE: "What is the blood pressure?" "Did you take at least two measurements 5 minutes apart?"     200/94  Hr 61 O2 100 2. ONSET: "When did you take your blood pressure?"     Today at PT 3. HOW: "How did you obtain the blood pressure?" (e.g., visiting nurse, automatic home BP monitor)    By Physical therapy 4. HISTORY: "Do you have a history of high blood pressure?"     yes 5. MEDICATIONS: "Are you taking any medications for blood pressure?" "Have you missed any doses recently?"     Yes took morning 6. OTHER SYMPTOMS: "Do you have any symptoms?" (e.g., headache, chest pain, blurred vision, difficulty breathing, weakness)     none 7. PREGNANCY: "Is there any chance you are pregnant?" "When was your last menstrual period?"    N/A  Protocols used: BLOOD PRESSURE - HIGH-A-AH

## 2020-07-24 NOTE — Therapy (Addendum)
[place]  [facility] [address] [place], [place], [address] Phone: [phone]   Fax:  [phone]  Physical Therapy Progress Note   Dates of reporting period  06/05/20   to   07/24/20  Patient Details  Name: Evan E Dahlen Sr. MRN: [id number] Date of Birth: 08/04/1956 Referring Provider (PT): Hendrickson, Clifford MD   Encounter Date: 07/24/2020   PT End of Session - 07/24/20 1348    Visit Number 10    Number of Visits 17    Date for PT Re-Evaluation 08/01/20    PT Start Time 1430    PT Stop Time 1515    PT Time Calculation (min) 45 min    Equipment Utilized During Treatment Gait belt    Activity Tolerance Patient tolerated treatment well    Behavior During Therapy WFL for tasks assessed/performed           Past Medical History:  Diagnosis Date  . Carotid arterial disease (HCC)    a. 08/2018 Carotid U/S: min-mod RICA atherosclerosis w/o hemodynamically significant stenosis. Nl LICA.  . Chest pain    a. 08/2019 MV: EF 55%. No ischemia. Diaph attenuation noted.  . Diabetes 1.5, managed as type 2 (HCC)   . Diastolic dysfunction    a. 08/2018 Echo: EF 65%. No rwma. Gr1 DD. Mild MR.  . Diastolic dysfunction    a. 08/2019 Echo: EF 55-60%, Gr1 DD. No rwma. Mild MR. RVSP 37.6mmHg.  . Hypercholesterolemia   . Hypertension   . Left hemiparesis (HCC)    a. Ambulated w/ Cane. Limited use of LUE.  . PAF (paroxysmal atrial fibrillation) (HCC)    a. 10/2019 Event Monitor: PAF; b. CHA2DS2VASc = 5-->Eliquis.  . Pain in both feet   . Poorly controlled diabetes mellitus (HCC)    a. 04/2019 A1c 13.8.  . Recurrent strokes (HCC)    a. 10/2016 MRI/A: Acute 5mm R thalamic infarct, ? subacute infarct of R corona radiata; b. 08/2017 MRI/A: Acute 8mm lateral L thalamic infarct. Other more remote lacunar infarcts of thalami bilat. Small vessel dzs; c. 08/2018 MRI/A: Acute lacunar infarct of the post limb of R internal capsule; d. 08/2019 MRI Acute CVA of L  paramedian pons adn R cerebellar hemisphere.  . Tobacco abuse     Past Surgical History:  Procedure Laterality Date  . ESOPHAGOGASTRODUODENOSCOPY (EGD) WITH PROPOFOL N/A 04/26/2019   Procedure: ESOPHAGOGASTRODUODENOSCOPY (EGD) WITH PROPOFOL;  Surgeon: Vanga, Rohini Reddy, MD;  Location: [facility] ENDOSCOPY;  Service: Gastroenterology;  Laterality: N/A;    Vitals:   07/24/20 1445  BP: (!) 200/94  Pulse: 61  SpO2: 100%     Subjective Assessment - 07/24/20 1347    Subjective Pt reports that he is doing alright today. No falls or significant health changes since his last therapy session. He is still not checking his blood sugar at home. Pt does not monitor his BP at home either.    Pertinent History Pertinent history and PMH: HTN, uncontrolled DM, HLD, R basal ganglia stroke on 08/2018 and L pointine and R cerebellar stroke 09/07/19, sinus bradycardia, peripheral neuropat    Patient Stated Goals to get back to fishing, walking better, balance.    Currently in Pain? No/denies              [facility] PT Assessment - 07/24/20 1442      Standardized Balance Assessment   Standardized Balance Assessment Dynamic Gait Index      Dynamic Gait Index   Level Surface   Moderate Impairment    Change in Gait Speed Mild Impairment    Gait with Horizontal Head Turns Moderate Impairment    Gait with Vertical Head Turns Moderate Impairment    Gait and Pivot Turn Mild Impairment    Step Over Obstacle Moderate Impairment    Step Around Obstacles Mild Impairment    Steps Moderate Impairment    Total Score 11            TREATMENT     Neuromuscular Re-education :  DGI: 11/24; Walking in hallway practicing fast and slow changes in gait speed. CGA+1, x 64' Walking in hallway with vertical and horizontal head turns. CGA+1. 64'x2 /direction. Single LOB with horizontal turns requiring PT assist to correct Checked vitals and ended session;  Outcome measures/goals updated during last session. Pt making  excellent progress towards goals. Pt is not appropriate to complete the FOTO however he was able to complete the DGI today and scored 11/24. During last visit pt achieved his TUG goal to 30.3 sec with quad cane demonstrating improved safety with STS transfer and turning mobility. Pt also partially achieved 5xSTS goal to 11.03 sec with RUE support. Although time was below 12 sec, pt still requires RUE support in order to perform. Pt improved BERG score to 45 with a goal of 50. Pt also improved gait speed with quad cane to 0.39 m/s with a goal of 0.5 m/s to demonstrate redcued risk of falls. Although pt has made significant progress in goals, pt still demonstrates limited LE strength and dynamic balance stil placing pt at risk of falls. Vitals obtained and BP is 200/94. BP check twice to confirm. Contacted PCP and nurse triage line advised therapist to send pt to the ED or the [facility]. Pt advised and agrees to go to the [facility]. Session terminated early due to elevated BP.                          PT Long Term Goals - 07/24/20 1348      PT LONG TERM GOAL #1   Title Pt will increase Berg Balance to 50/56 to demonstrate clinically significant improvement in balance decreasing pt's risk for falls during functional activities.    Baseline 10/20: 39/56; 11/18: 45/56    Time 4    Period Weeks    Status Partially Met      PT LONG TERM GOAL #2   Title Pt will improve 10 m gait speed to > 0.5 m/s  with LRAD to improve safe community ambulation.    Baseline 10/20: 0.34 m/s with SPC and SBA+1; 11/18: 0.39 m/s with quad cane and SBA+1    Time 4    Period Weeks    Status Not Met      PT LONG TERM GOAL #3   Title Pt will improve 5xSTS to < 12 sec with no RUE support to demonstrate clinically significant improvement in BLE strength to improve STS t/f's like standing from the toilet.    Baseline 10/20: 14.48 sec with RUE support on hand rail.; 11/18:11.03 sec with RUE  support.    Time 4    Period Weeks    Status Partially Met      PT LONG TERM GOAL #4   Title Pt will improve DGI by a minimum of 5 points to demonstrate clinically significant improvement and safety with community ambulation tasks to decrease risk of falls.    Baseline 10/20:  10/22; Deferred to next session due to time    Time 4    Period Weeks    Status On-going      PT LONG TERM GOAL #5   Title Pt will improve FOTO score to target amount to demonstrate perceived improvements in ability to complete ADL's.    Baseline 10/20: Perform next session.; 11/18: Still needs to be performed.    Time 4    Period Weeks    Status On-going      PT LONG TERM GOAL #6   Title Pt will improve TUG time with donned AFO and LSC by 3 sec to display clinically significant improvement in safe ambulation with AD.    Baseline 10/26: 36.37 sec; 11/18: 30.3 sec with AFO and quad cane.    Time 4    Period Weeks    Status Achieved                 Plan - 07/24/20 1348    Clinical Impression Statement Outcome measures/goals updated during last session. Pt making excellent progress towards goals. Pt is not appropriate to complete the FOTO however he was able to complete the DGI today and scored 11/24. During last visit pt achieved his TUG goal to 30.3 sec with quad cane demonstrating improved safety with STS transfer and turning mobility. Pt also partially achieved 5xSTS goal to 11.03 sec with RUE support. Although time was below 12 sec, pt still requires RUE support in order to perform. Pt improved BERG score to 45 with a goal of 50. Pt also improved gait speed with quad cane to 0.39 m/s with a goal of 0.5 m/s to demonstrate redcued risk of falls. Although pt has made significant progress in goals, pt still demonstrates limited LE strength and dynamic balance stil placing pt at risk of falls. Vitals obtained and BP is 200/94. BP check twice to confirm. Contacted PCP and nurse triage line advised therapist to  send pt to the ED or the [facility]. Pt advised and agrees to go to the [facility]. Session terminated early due to elevated BP.    Personal Factors and Comorbidities Age;Comorbidity 3+;Education;Finances;Past/Current Experience;Fitness;Transportation    Comorbidities HTN, uncontrolled DM, HLD, R basal ganglia stroke on 08/2018 and L pointine and R cerebellar stroke 09/07/19, sinus bradycardia, peripheral neuropathy    Examination-Activity Limitations Bathing;Bed Mobility;Dressing;Transfers;Squat;Lift;Locomotion Level;Stairs;Reach Overhead;Carry;Stand;Sit    Examination-Participation Restrictions Community Activity;Interpersonal Relationship;Yard Work    [employer] Evolving/Moderate complexity    Rehab Potential Fair    PT Frequency 2x / week    PT Duration 4 weeks    PT Treatment/Interventions ADLs/Self Care Home Management;Aquatic Therapy;Electrical Stimulation;Moist Heat;DME Instruction;Gait training;Stair training;Functional mobility training;Therapeutic activities;Therapeutic exercise;Balance training;Neuromuscular re-education;Patient/family education;Orthotic Fit/Training;Dry needling;Manual techniques    PT Next Visit Plan Reassess DGI and perform FOTO.    PT Home Exercise Plan HEP: standing hip marches/abd, ankle DF/PF in sitting/supine, passive L elbow stretch for tone    Consulted and Agree with Plan of Care Patient    Family Member Consulted wife           Patient will benefit from skilled therapeutic intervention in order to improve the following deficits and impairments:  Abnormal gait, Decreased coordination, Decreased range of motion, Difficulty walking, Impaired tone, Decreased endurance, Impaired UE functional use, Decreased activity tolerance, Impaired perceived functional ability, Decreased balance, Impaired flexibility, Decreased cognition, Decreased mobility, Decreased strength, Increased edema, Postural dysfunction  Visit  Diagnosis: Muscle weakness (generalized)  Unsteadiness on feet  Problem List Patient Active Problem List   Diagnosis Date Noted  . Stage 3a chronic kidney disease ([place]) 06/27/2020  . Monitoring for anticoagulant use 06/27/2020  . Lower extremity edema 05/16/2020  . Paroxysmal atrial fibrillation ([place]) 10/26/2019  . Chest pain of uncertain etiology [id number]/4967  . History of CVA (cerebrovascular accident) 09/05/2019  . Hyperglycemia 09/05/2019  . Anemia 09/05/2019  . AKI (acute kidney injury) ([place]) 09/05/2019  . Atypical pneumonia 09/05/2019  . Coagulation disorder ([place]) 06/12/2019  . Gastroesophageal reflux disease   . Hemiplegia and hemiparesis following cerebral infarction affecting left non-dominant side ([place]) 11/25/2018  . 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 RBBB 10/15/2018  . Sinus bradycardia 10/15/2018  . Dysphagia, post-stroke   . Neuropathic pain   . Lacunar infarct, acute ([place]) 09/06/2018  . TIA (transient ischemic attack) 09/01/2018  . Microalbuminuria 07/29/2018  . Left leg pain 01/13/2018  . Hyperlipidemia LDL goal <70 02/11/2017  . Recurrent strokes ([place]) 11/10/2016  . Essential hypertension 11/10/2016  . Diabetes ([place]) 11/10/2016  . Tobacco abuse 08/28/2013    Phillips Grout PT, DPT, GCS  Quynh Basso 07/24/2020, 3:29 PM  [facility] [address] [place], [place], [address] Phone: [phone]   Fax:  [phone]  Name: Evan Cones Sr. MRN: [id number] Date of Birth: [birth year]

## 2020-07-25 NOTE — Telephone Encounter (Signed)
If patient was not assessed, can you please contact the patient and see if he can come in and get a repeat blood pressure check, and if it is significantly elevated, having him seen by a provider. Thanks

## 2020-07-29 ENCOUNTER — Ambulatory Visit: Payer: Medicaid Other

## 2020-07-31 ENCOUNTER — Ambulatory Visit: Payer: Medicaid Other

## 2020-07-31 ENCOUNTER — Other Ambulatory Visit: Payer: Self-pay

## 2020-07-31 VITALS — BP 203/99 | HR 77

## 2020-07-31 DIAGNOSIS — R2681 Unsteadiness on feet: Secondary | ICD-10-CM | POA: Diagnosis not present

## 2020-07-31 DIAGNOSIS — M6281 Muscle weakness (generalized): Secondary | ICD-10-CM | POA: Diagnosis not present

## 2020-07-31 NOTE — Telephone Encounter (Signed)
I have tried to contact patient for several days now since I received the message. I have lvm for him to return call to see if he has had his blood pressure assessed since it was elevated during PT. If patient calls back please relay message below.

## 2020-07-31 NOTE — Therapy (Signed)
[facility] [address]. [place], [place], [address] Phone: [phone]   Fax:  [phone]  Physical Therapy Treatment/Recertification  Patient Details  Name: Evan COLGLAZIER Sr. MRN: [id number] Date of Birth: [birth year] Referring Provider (PT): Lebron Conners MD   Encounter Date: 07/31/2020   PT End of Session - 07/31/20 1419    Visit Number 11    Number of Visits 33    Date for PT Re-Evaluation 09/25/20    PT Start Time 1430    PT Stop Time 1514    PT Time Calculation (min) 44 min    Equipment Utilized During Treatment Gait belt    Activity Tolerance Patient tolerated treatment well    Behavior During Therapy WFL for tasks assessed/performed           Past Medical History:  Diagnosis Date  . Carotid arterial disease ([place])    a. 08/2018 Carotid U/S: min-mod RICA atherosclerosis w/o hemodynamically significant stenosis. Nl LICA.  Marland Kitchen Chest pain    a. 08/2019 MV: EF 55%. No ischemia. Diaph attenuation noted.  . Diabetes 1.5, managed as type 2 ([place])   . Diastolic dysfunction    a. 08/2018 Echo: EF 65%. No rwma. Gr1 DD. Mild MR.  . Diastolic dysfunction    a. 08/2019 Echo: EF 55-60%, Gr1 DD. No rwma. Mild MR. RVSP 37.368mHg.  .Marland KitchenHypercholesterolemia   . Hypertension   . Left hemiparesis ([place]    a. Ambulated w/ Cane. Limited use of LUE.  .Marland KitchenPAF (paroxysmal atrial fibrillation) ([place]    a. 10/2019 Event Monitor: PAF; b. CHA2DS2VASc = 5-->Eliquis.  . Pain in both feet   . Poorly controlled diabetes mellitus ([place]    a. 04/2019 A1c 13.8.  .Marland KitchenRecurrent strokes ([place]    a. 10/2016 MRI/A: Acute 5868mR thalamic infarct, ? subacute infarct of R corona radiata; b. 08/2017 MRI/A: Acute 68m27materal L thalamic infarct. Other more remote lacunar infarcts of thalami bilat. Small vessel dzs; c. 08/2018 MRI/A: Acute lacunar infarct of the post limb of R internal capsule; d. 08/2019 MRI Acute CVA of L paramedian pons adn R cerebellar hemisphere.   . Tobacco abuse     Past Surgical History:  Procedure Laterality Date  . ESOPHAGOGASTRODUODENOSCOPY (EGD) WITH PROPOFOL N/A 04/26/2019   Procedure: ESOPHAGOGASTRODUODENOSCOPY (EGD) WITH PROPOFOL;  Surgeon: VanLin LandsmanD;  [place];  Laterality: N/A;    Vitals:   07/31/20 1438  BP: (!) 203/99  Pulse: 77  SpO2: 100%     Subjective Assessment - 07/31/20 1419    Subjective Pt reports that he is doing alright today. No falls or significant health changes since his last therapy session. He has checked his BP at home today and states that the systolic was around [id number] [facility] as advised at the last therapy session and has not scheduled an appointment with his PCP or received a phone call from their office. He states that he has been checking his blood glucose at home and it is running around 170. He plans to try to schedule an appointment with his PCP for tomorrow. No specific questions or concens at this time.    Pertinent History Pertinent history and PMH: HTN, uncontrolled DM, HLD, R basal ganglia stroke on 08/2018 and L pointine and R cerebellar stroke 09/07/19, sinus bradycardia, peripheral neuropat    Patient Stated Goals to get back to fishing, walking better, balance.  Currently in Pain? No/denies            TREATMENT   Neuromuscular Re-education  Initial BP is 203/99, repeated vitals after 5 minutes of rest with Dynamap and is 199/93, HR: 66; Manual BP taken and is 194/100 mmHg,  Pt advised to go to [facility] but he declines and states that he plans to go to his PCP tomorrow. NBOS eyes open/closed x 30s each; NBOS horizontal and vertical head turns x 30s each; Airex NBOS eyes open/closed x 30s each; Airex NBOS horizontal and vertical head turns x 30s each; Alternating 2" Airex step taps without UE support x 10; Small hurdle forward step over x 10 leading with each LE, no UE  support when leading with RLE, RUE support when leading with LLE, cues to decrease trunk lean; Side stepping in // bars without UE support x 2 lengths each direction;   Pt educated throughout session about proper posture and technique with exercises. Improved exercise technique, movement at target joints, use of target muscles after min to mod verbal, visual, tactile cues.    Patient arrives with increased blood pressure once again.  Patient allowed to sit for 5 minutes and retaken with both Dinamap and manually with stethoscope.  Patient once again refused to go to [facility] urgent care however agrees to go see his primary care provider tomorrow.  Goals were recently updated so they do not need to be repeated again today.  Patient has demonstrated improvement in his lower extremity strength, balance, and walking speed.  He is inconsistent coming to his appointments and has yet to achieve maximal benefit from physical therapy services. Pt will benefit from PT services to address deficits in strength, balance, and mobility in order to return to full function at home.                         PT Long Term Goals - 08/01/20 0954      PT LONG TERM GOAL #1   Title Pt will increase Berg Balance to 50/56 to demonstrate clinically significant improvement in balance decreasing pt's risk for falls during functional activities.    Baseline 10/20: 39/56; 11/18: 45/56    Time 8    Period Weeks    Status Partially Met    Target Date 09/25/20      PT LONG TERM GOAL #2   Title Pt will improve 10 m gait speed to > 0.5 m/s  with LRAD to improve safe community ambulation.    Baseline 10/20: 0.34 m/s with SPC and SBA+1; 11/18: 0.39 m/s with quad cane and SBA+1    Time 8    Period Weeks    Status Not Met    Target Date 09/25/20      PT LONG TERM GOAL #3   Title Pt will improve 5xSTS to < 12 sec with no RUE support to demonstrate clinically significant improvement in BLE strength to improve STS  t/f's like standing from the toilet.    Baseline 10/20: 14.48 sec with RUE support on hand rail.; 11/18:11.03 sec with RUE support.    Time 8    Period Weeks    Status Partially Met    Target Date 09/25/20      PT LONG TERM GOAL #4   Title Pt will improve DGI by a minimum of 5 points to demonstrate clinically significant improvement and safety with community ambulation tasks to decrease risk of falls.  Baseline 10/20: 10/22; Deferred to next session due to time; 07/24/20: 11/24    Time 8    Period Weeks    Status On-going    Target Date 09/25/20      PT LONG TERM GOAL #5   Title Pt will improve FOTO score to target amount to demonstrate perceived improvements in ability to complete ADL's.    Baseline 10/20: Perform next session.; 11/18: Still needs to be performed.    Time 8    Period Weeks    Status Unable to assess      Additional Long Term Goals   Additional Long Term Goals Yes      PT LONG TERM GOAL #6   Title Pt will improve TUG time with donned AFO and LSC by 3 sec to display clinically significant improvement in safe ambulation with AD.    Baseline 10/26: 36.37 sec; 11/18: 30.3 sec with AFO and quad cane.    Time 8    Period Weeks    Status Achieved                 Plan - 07/31/20 1436    Clinical Impression Statement Patient arrives with increased blood pressure once again.  Patient allowed to sit for 5 minutes and retaken with both Dinamap and manually with stethoscope.  Patient once again refused to go to [facility] urgent care however agrees to go see his primary care provider tomorrow.  Goals were recently updated so they do not need to be repeated again today.  Patient has demonstrated improvement in his lower extremity strength, balance, and walking speed.  He is inconsistent coming to his appointments and has yet to achieve maximal benefit from physical therapy services. Pt will benefit from PT services to address deficits in strength, balance, and mobility in  order to return to full function at home.    Personal Factors and Comorbidities Age;Comorbidity 3+;Education;Finances;Past/Current Experience;Fitness;Transportation    Comorbidities HTN, uncontrolled DM, HLD, R basal ganglia stroke on 08/2018 and L pointine and R cerebellar stroke 09/07/19, sinus bradycardia, peripheral neuropathy    Examination-Activity Limitations Bathing;Bed Mobility;Dressing;Transfers;Squat;Lift;Locomotion Level;Stairs;Reach Overhead;Carry;Stand;Sit    Examination-Participation Restrictions Community Activity;Interpersonal Relationship;Yard Work    Merchant [employer] officer Evolving/Moderate complexity    Rehab Potential Fair    PT Frequency 2x / week    PT Duration 8 weeks    PT Treatment/Interventions ADLs/Self Care Home Management;Aquatic Therapy;Electrical Stimulation;Moist Heat;DME Instruction;Gait training;Stair training;Functional mobility training;Therapeutic activities;Therapeutic exercise;Balance training;Neuromuscular re-education;Patient/family education;Orthotic Fit/Training;Dry needling;Manual techniques;Canalith Repostioning;Vestibular    PT Next Visit Plan Continue balance and strength    PT Home Exercise Plan HEP: standing hip marches/abd, ankle DF/PF in sitting/supine, passive L elbow stretch for tone    Consulted and Agree with Plan of Care Patient    Family Member Consulted wife           Patient will benefit from skilled therapeutic intervention in order to improve the following deficits and impairments:  Abnormal gait,Decreased coordination,Decreased range of motion,Difficulty walking,Impaired tone,Decreased endurance,Impaired UE functional use,Decreased activity tolerance,Impaired perceived functional ability,Decreased balance,Impaired flexibility,Decreased cognition,Decreased mobility,Decreased strength,Increased edema,Postural dysfunction  Visit Diagnosis: Muscle weakness (generalized)  Unsteadiness on feet     Problem List Patient  Active Problem List   Diagnosis Date Noted  . Stage 3a chronic kidney disease ([place]) 06/27/2020  . Monitoring for anticoagulant use 06/27/2020  . Lower extremity edema 05/16/2020  . Paroxysmal atrial fibrillation ([place]) 10/26/2019  . Chest pain of uncertain etiology 83/66/2947  . History of CVA (cerebrovascular accident)  09/05/2019  . Hyperglycemia 09/05/2019  . Anemia 09/05/2019  . AKI (acute kidney injury) ([place]) 09/05/2019  . Atypical pneumonia 09/05/2019  . Coagulation disorder ([place]) 06/12/2019  . Gastroesophageal reflux disease   . Hemiplegia and hemiparesis following cerebral infarction affecting left non-dominant side ([place]) 11/25/2018  . 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 RBBB 10/15/2018  . Sinus bradycardia 10/15/2018  . Dysphagia, post-stroke   . Neuropathic pain   . Lacunar infarct, acute ([place]) 09/06/2018  . TIA (transient ischemic attack) 09/01/2018  . Microalbuminuria 07/29/2018  . Left leg pain 01/13/2018  . Hyperlipidemia LDL goal <70 02/11/2017  . Recurrent strokes ([place]) 11/10/2016  . Essential hypertension 11/10/2016  . Diabetes ([place]) 11/10/2016  . Tobacco abuse 08/28/2013   Phillips Grout PT, DPT, GCS  Ludia Gartland 08/01/2020, 10:01 AM  [facility] [address]. [place], [place], [address] Phone: [phone]   Fax:  [phone]  Name: Evan Cones Sr. MRN: [id number] Date of Birth: [birth year]

## 2020-08-05 ENCOUNTER — Other Ambulatory Visit: Payer: Self-pay

## 2020-08-05 ENCOUNTER — Ambulatory Visit: Payer: Medicaid Other

## 2020-08-05 ENCOUNTER — Ambulatory Visit: Payer: Self-pay

## 2020-08-05 ENCOUNTER — Encounter: Payer: Self-pay | Admitting: Emergency Medicine

## 2020-08-05 ENCOUNTER — Emergency Department: Payer: Medicaid Other

## 2020-08-05 ENCOUNTER — Observation Stay
Admission: EM | Admit: 2020-08-05 | Discharge: 2020-08-06 | Disposition: A | Discharge status: Home / Self Care | Payer: Medicaid Other | Attending: Internal Medicine | Admitting: Internal Medicine

## 2020-08-05 DIAGNOSIS — D689 Coagulation defect, unspecified: Secondary | ICD-10-CM | POA: Diagnosis not present

## 2020-08-05 DIAGNOSIS — I480 Paroxysmal atrial fibrillation: Secondary | ICD-10-CM | POA: Diagnosis not present

## 2020-08-05 DIAGNOSIS — I2510 Atherosclerotic heart disease of native coronary artery without angina pectoris: Secondary | ICD-10-CM | POA: Insufficient documentation

## 2020-08-05 DIAGNOSIS — E11649 Type 2 diabetes mellitus with hypoglycemia without coma: Secondary | ICD-10-CM | POA: Diagnosis not present

## 2020-08-05 DIAGNOSIS — Z7901 Long term (current) use of anticoagulants: Secondary | ICD-10-CM | POA: Diagnosis not present

## 2020-08-05 DIAGNOSIS — I160 Hypertensive urgency: Principal | ICD-10-CM

## 2020-08-05 DIAGNOSIS — Z794 Long term (current) use of insulin: Secondary | ICD-10-CM | POA: Diagnosis not present

## 2020-08-05 DIAGNOSIS — I4891 Unspecified atrial fibrillation: Secondary | ICD-10-CM | POA: Diagnosis present

## 2020-08-05 DIAGNOSIS — Z7984 Long term (current) use of oral hypoglycemic drugs: Secondary | ICD-10-CM | POA: Insufficient documentation

## 2020-08-05 DIAGNOSIS — R519 Headache, unspecified: Secondary | ICD-10-CM | POA: Insufficient documentation

## 2020-08-05 DIAGNOSIS — E162 Hypoglycemia, unspecified: Secondary | ICD-10-CM

## 2020-08-05 DIAGNOSIS — N182 Chronic kidney disease, stage 2 (mild): Secondary | ICD-10-CM | POA: Diagnosis not present

## 2020-08-05 DIAGNOSIS — E785 Hyperlipidemia, unspecified: Secondary | ICD-10-CM | POA: Diagnosis present

## 2020-08-05 DIAGNOSIS — Z79899 Other long term (current) drug therapy: Secondary | ICD-10-CM | POA: Diagnosis not present

## 2020-08-05 DIAGNOSIS — I639 Cerebral infarction, unspecified: Secondary | ICD-10-CM

## 2020-08-05 DIAGNOSIS — E1169 Type 2 diabetes mellitus with other specified complication: Secondary | ICD-10-CM | POA: Diagnosis present

## 2020-08-05 DIAGNOSIS — I129 Hypertensive chronic kidney disease with stage 1 through stage 4 chronic kidney disease, or unspecified chronic kidney disease: Secondary | ICD-10-CM | POA: Diagnosis not present

## 2020-08-05 DIAGNOSIS — I482 Chronic atrial fibrillation, unspecified: Secondary | ICD-10-CM | POA: Diagnosis present

## 2020-08-05 DIAGNOSIS — R61 Generalized hyperhidrosis: Secondary | ICD-10-CM | POA: Diagnosis present

## 2020-08-05 DIAGNOSIS — Z20822 Contact with and (suspected) exposure to covid-19: Secondary | ICD-10-CM | POA: Diagnosis not present

## 2020-08-05 DIAGNOSIS — E1165 Type 2 diabetes mellitus with hyperglycemia: Secondary | ICD-10-CM | POA: Diagnosis present

## 2020-08-05 DIAGNOSIS — E119 Type 2 diabetes mellitus without complications: Secondary | ICD-10-CM | POA: Diagnosis present

## 2020-08-05 LAB — COMPREHENSIVE METABOLIC PANEL
ALT: 41 U/L (ref 0–44)
AST: 26 U/L (ref 15–41)
Albumin: 3.8 g/dL (ref 3.5–5.0)
Alkaline Phosphatase: 153 U/L — ABNORMAL HIGH (ref 38–126)
Anion gap: 10 (ref 5–15)
BUN: 17 mg/dL (ref 8–23)
CO2: 25 mmol/L (ref 22–32)
Calcium: 9.3 mg/dL (ref 8.9–10.3)
Chloride: 106 mmol/L (ref 98–111)
Creatinine, Ser: 0.94 mg/dL (ref 0.61–1.24)
GFR, Estimated: >60 mL/min (ref >60)
Glucose, Bld: 62 mg/dL — ABNORMAL LOW (ref 70–99)
Potassium: 3.6 mmol/L (ref 3.5–5.1)
Sodium: 141 mmol/L (ref 135–145)
Total Bilirubin: 0.5 mg/dL (ref 0.3–1.2)
Total Protein: 7.8 g/dL (ref 6.5–8.1)

## 2020-08-05 LAB — CBC
HCT: 37.1 % — ABNORMAL LOW (ref 39.0–52.0)
Hemoglobin: 12.6 g/dL — ABNORMAL LOW (ref 13.0–17.0)
MCH: 30.3 pg (ref 26.0–34.0)
MCHC: 34 g/dL (ref 30.0–36.0)
MCV: 89.2 fL (ref 80.0–100.0)
Platelets: 262 10*3/uL (ref 150–400)
RBC: 4.16 MIL/uL — ABNORMAL LOW (ref 4.22–5.81)
RDW: 14.9 % (ref 11.5–15.5)
WBC: 4.9 10*3/uL (ref 4.0–10.5)
nRBC: 0 % (ref 0.0–0.2)

## 2020-08-05 LAB — RESP PANEL BY RT-PCR (FLU A&B, COVID) ARPGX2
Influenza A by PCR: NEGATIVE
Influenza B by PCR: NEGATIVE
SARS Coronavirus 2 by RT PCR: NEGATIVE

## 2020-08-05 LAB — TROPONIN I (HIGH SENSITIVITY)
Troponin I (High Sensitivity): 12 ng/L (ref <18)
Troponin I (High Sensitivity): 9 ng/L (ref <18)

## 2020-08-05 LAB — CBG MONITORING, ED
Glucose-Capillary: 76 mg/dL (ref 70–99)
Glucose-Capillary: 80 mg/dL (ref 70–99)

## 2020-08-05 MED ORDER — INSULIN ASPART 100 UNIT/ML ~~LOC~~ SOLN: 0.0000 [IU] | Freq: Three times a day (TID) | SUBCUTANEOUS | Status: DC

## 2020-08-05 MED ORDER — HYDRALAZINE HCL 25 MG PO TABS: 25.0000 mg | ORAL_TABLET | Freq: Four times a day (QID) | ORAL | Status: DC | PRN

## 2020-08-05 MED ORDER — ATORVASTATIN CALCIUM 20 MG PO TABS
40.0000 mg | ORAL_TABLET | Freq: Every day | ORAL | Status: DC
  Administered 2020-08-06: 40 mg via ORAL
  Filled 2020-08-05: qty 2

## 2020-08-05 MED ORDER — ENOXAPARIN SODIUM 40 MG/0.4ML ~~LOC~~ SOLN: 40.0000 mg | SUBCUTANEOUS | Status: DC

## 2020-08-05 MED ORDER — HYDRALAZINE HCL 20 MG/ML IJ SOLN
10.0000 mg | Freq: Once | INTRAMUSCULAR | Status: AC
  Administered 2020-08-05: 10 mg via INTRAVENOUS
  Filled 2020-08-05: qty 1

## 2020-08-05 MED ORDER — ONDANSETRON HCL 4 MG PO TABS: 4.0000 mg | ORAL_TABLET | Freq: Four times a day (QID) | ORAL | Status: DC | PRN

## 2020-08-05 MED ORDER — LABETALOL HCL 5 MG/ML IV SOLN: 10.0000 mg | INTRAVENOUS | Status: DC | PRN

## 2020-08-05 MED ORDER — APIXABAN 5 MG PO TABS
5.0000 mg | ORAL_TABLET | Freq: Two times a day (BID) | ORAL | Status: DC
  Administered 2020-08-05 – 2020-08-06 (×2): 5 mg via ORAL
  Filled 2020-08-05 (×2): qty 1

## 2020-08-05 MED ORDER — HYDRALAZINE HCL 50 MG PO TABS
25.0000 mg | ORAL_TABLET | Freq: Once | ORAL | Status: AC
  Administered 2020-08-05: 25 mg via ORAL
  Filled 2020-08-05: qty 1

## 2020-08-05 MED ORDER — SENNOSIDES-DOCUSATE SODIUM 8.6-50 MG PO TABS: 1.0000 | ORAL_TABLET | Freq: Every evening | ORAL | Status: DC | PRN

## 2020-08-05 MED ORDER — LISINOPRIL 20 MG PO TABS
40.0000 mg | ORAL_TABLET | Freq: Every day | ORAL | Status: DC
  Administered 2020-08-06: 40 mg via ORAL
  Filled 2020-08-05: qty 2

## 2020-08-05 MED ORDER — SODIUM CHLORIDE 0.9 % IV SOLN: 250.0000 mL | INTRAVENOUS | Status: DC | PRN

## 2020-08-05 MED ORDER — HYDROCODONE-ACETAMINOPHEN 5-325 MG PO TABS: 1.0000 | ORAL_TABLET | ORAL | Status: DC | PRN

## 2020-08-05 MED ORDER — ONDANSETRON HCL 4 MG/2ML IJ SOLN: 4.0000 mg | Freq: Four times a day (QID) | INTRAMUSCULAR | Status: DC | PRN

## 2020-08-05 MED ORDER — AMLODIPINE BESYLATE 10 MG PO TABS
10.0000 mg | ORAL_TABLET | Freq: Every day | ORAL | Status: DC
  Administered 2020-08-05 – 2020-08-06 (×2): 10 mg via ORAL
  Filled 2020-08-05: qty 1
  Filled 2020-08-05: qty 2

## 2020-08-05 MED ORDER — ACETAMINOPHEN 325 MG PO TABS
650.0000 mg | ORAL_TABLET | Freq: Four times a day (QID) | ORAL | Status: DC | PRN
  Administered 2020-08-06: 650 mg via ORAL
  Filled 2020-08-05: qty 2

## 2020-08-05 MED ORDER — ACETAMINOPHEN 650 MG RE SUPP: 650.0000 mg | Freq: Four times a day (QID) | RECTAL | Status: DC | PRN

## 2020-08-05 NOTE — ED Notes (Signed)
ED Provider at bedside. 

## 2020-08-05 NOTE — ED Notes (Signed)
ED TO INPATIENT HANDOFF REPORT  ED Nurse Name and Phone #: Anette Riedeloah [phone]  S Name/Age/Gender Evan Anouncan E Lague Sr. 64 y.o. male Room/Bed: ED38A/ED38A  Code Status   Code Status: Full Code  Home/SNF/Other Home Patient oriented to: self, place, time and situation Is this baseline? Yes   Triage Complete: Triage complete  Chief Complaint Hypertensive urgency [I16.0]  Triage Note Pt to ER states he took BP at home and it was elevated.  Pt states he was sweating a lot and that prompted him to take BP.  Pt states he called his PCP who told him to come here.  Pt denies any other s/s or c/o at this time.    Allergies No Known Allergies  Level of Care/Admitting Diagnosis ED Disposition    ED Disposition Condition Comment   Admit  [facility] [[id number]]  Level of Care: Med-Surg [16]  Covid Evaluation: Confirmed COVID Negative  Diagnosis: Hypertensive urgency [[id number]][[id number]]  Admitting Physician: Briscoe DeutscherOPYD, TIMOTHY S [[id number]]  Attending Physician: Briscoe DeutscherPYD, TIMOTHY S [[id number]]       B Medical/Surgery History Past Medical History:  Diagnosis Date  . Carotid arterial disease (HCC)    a. 08/2018 Carotid U/S: min-mod RICA atherosclerosis w/o hemodynamically significant stenosis. Nl LICA.  Marland Kitchen. Chest pain    a. 08/2019 MV: EF 55%. No ischemia. Diaph attenuation noted.  . Diabetes 1.5, managed as type 2 (HCC)   . Diastolic dysfunction    a. 08/2018 Echo: EF 65%. No rwma. Gr1 DD. Mild MR.  . Diastolic dysfunction    a. 08/2019 Echo: EF 55-60%, Gr1 DD. No rwma. Mild MR. RVSP 37.16mmHg.  Marland Kitchen. Hypercholesterolemia   . Hypertension   . Left hemiparesis (HCC)    a. Ambulated w/ Cane. Limited use of LUE.  Marland Kitchen. PAF (paroxysmal atrial fibrillation) (HCC)    a. 10/2019 Event Monitor: PAF; b. CHA2DS2VASc = 5-->Eliquis.  . Pain in both feet   . Poorly controlled diabetes mellitus (HCC)    a. 04/2019 A1c 13.8.  Marland Kitchen. Recurrent strokes (HCC)    a. 10/2016 MRI/A: Acute 5mm R thalamic infarct,  ? subacute infarct of R corona radiata; b. 08/2017 MRI/A: Acute 8mm lateral L thalamic infarct. Other more remote lacunar infarcts of thalami bilat. Small vessel dzs; c. 08/2018 MRI/A: Acute lacunar infarct of the post limb of R internal capsule; d. 08/2019 MRI Acute CVA of L paramedian pons adn R cerebellar hemisphere.  . Tobacco abuse    Past Surgical History:  Procedure Laterality Date  . ESOPHAGOGASTRODUODENOSCOPY (EGD) WITH PROPOFOL N/A 04/26/2019   Procedure: ESOPHAGOGASTRODUODENOSCOPY (EGD) WITH PROPOFOL;  Surgeon: Toney ReilVanga, Rohini Reddy, MD;  [facility];  Service: Gastroenterology;  Laterality: N/A;     A IV Location/Drains/Wounds Patient Lines/Drains/Airways Status    Active Line/Drains/Airways    Name Placement date Placement time Site Days   Peripheral IV 08/05/20 Right Hand 08/05/20  1937  Hand  less than 1          Intake/Output Last 24 hours No intake or output data in the 24 hours ending 08/05/20 2250  Labs/Imaging Results for orders placed or performed during the hospital encounter of 08/05/20 (from the past 48 hour(s))  CBC     Status: Abnormal   Collection Time: 08/05/20  2:08 PM  Result Value Ref Range   WBC 4.9 4.0 - 10.5 K/uL   RBC 4.16 (L) 4.22 - 5.81 MIL/uL   Hemoglobin 12.6 (L) 13.0 - 17.0 g/dL   HCT 21.337.1 (L) 08.639.0 -  52.0 %   MCV 89.2 80.0 - 100.0 fL   MCH 30.3 26.0 - 34.0 pg   MCHC 34.0 30.0 - 36.0 g/dL   RDW 35.3 61.4 - 43.1 %   Platelets 262 150 - 400 K/uL   nRBC 0.0 0.0 - 0.2 %    Comment: Performed at [facility], [address] [place], [place] [address]  Comprehensive metabolic panel     Status: Abnormal   Collection Time: 08/05/20  2:08 PM  Result Value Ref Range   Sodium 141 135 - 145 mmol/L   Potassium 3.6 3.5 - 5.1 mmol/L   Chloride 106 98 - 111 mmol/L   CO2 25 22 - 32 mmol/L   Glucose, Bld 62 (L) 70 - 99 mg/dL    Comment: Glucose reference range applies only to samples taken after fasting for at least 8 hours.   BUN  17 8 - 23 mg/dL   Creatinine, Ser 6.76 0.61 - 1.24 mg/dL   Calcium 9.3 8.9 - 19.5 mg/dL   Total Protein 7.8 6.5 - 8.1 g/dL   Albumin 3.8 3.5 - 5.0 g/dL   AST 26 15 - 41 U/L   ALT 41 0 - 44 U/L   Alkaline Phosphatase 153 (H) 38 - 126 U/L   Total Bilirubin 0.5 0.3 - 1.2 mg/dL   GFR, Estimated >09 >32 mL/min    Comment: (NOTE) Calculated using the CKD-EPI Creatinine Equation (2021)    Anion gap 10 5 - 15    Comment: Performed at [facility], [address] [place], [place] [address]  Troponin I (High Sensitivity)     Status: None   Collection Time: 08/05/20  2:08 PM  Result Value Ref Range   Troponin I (High Sensitivity) 12 <18 ng/L    Comment: (NOTE) Elevated high sensitivity troponin I (hsTnI) values and significant  changes across serial measurements may suggest ACS but many other  chronic and acute conditions are known to elevate hsTnI results.  Refer to the "Links" section for chest pain algorithms and additional  guidance. Performed at [facility], [address] [place], [place] [address]   Troponin I (High Sensitivity)     Status: None   Collection Time: 08/05/20  4:16 PM  Result Value Ref Range   Troponin I (High Sensitivity) 9 <18 ng/L    Comment: (NOTE) Elevated high sensitivity troponin I (hsTnI) values and significant  changes across serial measurements may suggest ACS but many other  chronic and acute conditions are known to elevate hsTnI results.  Refer to the "Links" section for chest pain algorithms and additional  guidance. Performed at [facility], [address] [place], [place] [address]   Resp Panel by RT-PCR (Flu A&B, Covid) Nasopharyngeal Swab     Status: None   Collection Time: 08/05/20  7:24 PM   Specimen: Nasopharyngeal Swab; Nasopharyngeal(NP) swabs in vial transport medium  Result Value Ref Range   SARS Coronavirus 2 by RT PCR NEGATIVE NEGATIVE    Comment: (NOTE) SARS-CoV-2 target nucleic acids are NOT  DETECTED.  The SARS-CoV-2 RNA is generally detectable in upper respiratory specimens during the acute phase of infection. The lowest concentration of SARS-CoV-2 viral copies this assay can detect is 138 copies/mL. A negative result does not preclude SARS-Cov-2 infection and should not be used as the sole basis for treatment or other patient management decisions. A negative result may occur with  improper specimen collection/handling, submission of specimen other than nasopharyngeal swab, presence  of viral mutation(s) within the areas targeted by this assay, and inadequate number of viral copies(<138 copies/mL). A negative result must be combined with clinical observations, patient history, and epidemiological information. The expected result is Negative.  Fact Sheet for Patients:  BloggerCourse.com  Fact Sheet for Healthcare Providers:  SeriousBroker.it  This test is no t yet approved or cleared by the [place] FDA and  has been authorized for detection and/or diagnosis of SARS-CoV-2 by FDA under an Emergency Use Authorization (EUA). This EUA will remain  in effect (meaning this test can be used) for the duration of the COVID-19 declaration under Section 564(b)(1) of the Act, 21 U.S.C.section 360bbb-3(b)(1), unless the authorization is terminated  or revoked sooner.       Influenza A by PCR NEGATIVE NEGATIVE   Influenza B by PCR NEGATIVE NEGATIVE    Comment: (NOTE) The Xpert Xpress SARS-CoV-2/FLU/RSV plus assay is intended as an aid in the diagnosis of influenza from Nasopharyngeal swab specimens and should not be used as a sole basis for treatment. Nasal washings and aspirates are unacceptable for Xpert Xpress SARS-CoV-2/FLU/RSV testing.  Fact Sheet for Patients: BloggerCourse.com  Fact Sheet for Healthcare Providers: SeriousBroker.it  This test is not yet approved or  cleared by the [place] FDA and has been authorized for detection and/or diagnosis of SARS-CoV-2 by FDA under an Emergency Use Authorization (EUA). This EUA will remain in effect (meaning this test can be used) for the duration of the COVID-19 declaration under Section 564(b)(1) of the Act, 21 U.S.C. section 360bbb-3(b)(1), unless the authorization is terminated or revoked.  Performed at [facility], [address] [place], [place] [address]   CBG monitoring, ED     Status: None   Collection Time: 08/05/20  8:59 PM  Result Value Ref Range   Glucose-Capillary 76 70 - 99 mg/dL    Comment: Glucose reference range applies only to samples taken after fasting for at least 8 hours.  CBG monitoring, ED     Status: None   Collection Time: 08/05/20 10:45 PM  Result Value Ref Range   Glucose-Capillary 80 70 - 99 mg/dL    Comment: Glucose reference range applies only to samples taken after fasting for at least 8 hours.   CT Head Wo Contrast  Result Date: 08/05/2020 CLINICAL DATA:  Headache, hypertension EXAM: CT HEAD WITHOUT CONTRAST TECHNIQUE: Contiguous axial images were obtained from the base of the skull through the vertex without intravenous contrast. COMPARISON:  09/05/2018 FINDINGS: Brain: Chronic hypodensities within the right internal capsule and left thalamus consistent with chronic small vessel ischemic change. No evidence of acute infarct or hemorrhage. Lateral ventricles and midline structures are unremarkable. No acute extra-axial fluid collections. No mass effect. Vascular: No hyperdense vessel or unexpected calcification. Skull: Normal. Negative for fracture or focal lesion. Sinuses/Orbits: Minimal mucoperiosteal thickening within the maxillary sinuses. Other: None. IMPRESSION: 1. Chronic small vessel ischemic changes. No acute intracranial process. Electronically Signed   By: Sharlet Salina M.D.   On: 08/05/2020 20:43    Pending Labs Unresulted Labs (From admission,  onward)          Start     Ordered   08/06/20 0500  Basic metabolic panel  Daily,   STAT      08/05/20 2110   08/06/20 0500  CBC  Tomorrow morning,   STAT        08/05/20 2110          Vitals/Pain Today's Vitals   08/05/20 1910  08/05/20 2000 08/05/20 2102 08/05/20 2246  BP: (!) 194/81 (!) 189/90 (!) 172/81 (!) 185/98  Pulse: (!) 51 65 60   Resp: 18 18 18    Temp:      TempSrc:      SpO2: 100% 98%    Weight:      Height:        Isolation Precautions No active isolations  Medications Medications  insulin aspart (novoLOG) injection 0-9 Units (has no administration in time range)  0.9 %  sodium chloride infusion (has no administration in time range)  acetaminophen (TYLENOL) tablet 650 mg (has no administration in time range)    Or  acetaminophen (TYLENOL) suppository 650 mg (has no administration in time range)  HYDROcodone-acetaminophen (NORCO/VICODIN) 5-325 MG per tablet 1-2 tablet (has no administration in time range)  senna-docusate (Senokot-S) tablet 1 tablet (has no administration in time range)  ondansetron (ZOFRAN) tablet 4 mg (has no administration in time range)    Or  ondansetron (ZOFRAN) injection 4 mg (has no administration in time range)  hydrALAZINE (APRESOLINE) tablet 25 mg (has no administration in time range)  apixaban (ELIQUIS) tablet 5 mg (5 mg Oral Given 08/05/20 2246)  atorvastatin (LIPITOR) tablet 40 mg (has no administration in time range)  lisinopril (ZESTRIL) tablet 40 mg (has no administration in time range)  amLODipine (NORVASC) tablet 10 mg (10 mg Oral Given 08/05/20 2246)  hydrALAZINE (APRESOLINE) injection 10 mg (10 mg Intravenous Given 08/05/20 1938)  hydrALAZINE (APRESOLINE) tablet 25 mg (25 mg Oral Given 08/05/20 2008)    Mobility walks with device High fall risk   Focused Assessments Cardiac Assessment Handoff:    Lab Results  Component Value Date   TROPONINI <0.03 09/01/2018   No results found for: DDIMER Does the Patient  currently have chest pain? No     R Recommendations: See Admitting Provider Note  Report given to:   Additional Notes:

## 2020-08-05 NOTE — ED Provider Notes (Signed)
[facility] Emergency Department Provider Note ____________________________________________   Event Date/Time   First MD Initiated Contact with Patient 08/05/20 1715     (approximate)  I have reviewed the triage vital signs and the nursing notes.  HISTORY  Chief Complaint Hypertension   HPI Evan WEDGE Sr. is a 64 y.o. malewho presents to the ED for evaluation of hypertension and sweating.  Chart review indicates history of paroxysmal A. fib on Eliquis, DM on insulin and oral agents, HTN, HLD.  Patient takes lisinopril 40 mg daily, HCTZ 25 mg.  Patient presents to the ED with his wife due to concerns for elevated blood pressure at home.  Patient's wife provides majority of history, as she is his primary caregiver and POA.  She reports returning from work today and noting the patient to be diaphoretic, so she checked his blood pressure and noted systolics greater than 295, and due to this presents to the ED for evaluation.  They report that patient has been compliant with his medications, taking both his lisinopril and HCTZ in the mornings, and taken this morning.  No recent changes to these medications.  Patient reports a sensation of a mild occipital headache, that he attributes to not having had food today since breakfast.  Further reporting hunger.  Beyond his headache, patient has no complaints.  No recent falls, trauma or injury.  Patient has left-sided contractures and weakness at baseline due to previous CVA and currently is in physical therapy.  Past Medical History:  Diagnosis Date  . Carotid arterial disease ([place])    a. 08/2018 Carotid U/S: min-mod RICA atherosclerosis w/o hemodynamically significant stenosis. Nl LICA.  Marland Kitchen Chest pain    a. 08/2019 MV: EF 55%. No ischemia. Diaph attenuation noted.  . Diabetes 1.5, managed as type 2 ([place])   . Diastolic dysfunction    a. 08/2018 Echo: EF 65%. No rwma. Gr1 DD. Mild MR.  . Diastolic dysfunction    a.  08/2019 Echo: EF 55-60%, Gr1 DD. No rwma. Mild MR. RVSP 37.3mHg.  .Marland KitchenHypercholesterolemia   . Hypertension   . Left hemiparesis ([place]    a. Ambulated w/ Cane. Limited use of LUE.  .Marland KitchenPAF (paroxysmal atrial fibrillation) ([place]    a. 10/2019 Event Monitor: PAF; b. CHA2DS2VASc = 5-->Eliquis.  . Pain in both feet   . Poorly controlled diabetes mellitus (HNaches    a. 04/2019 A1c 13.8.  .Marland KitchenRecurrent strokes (HHalls[place]    a. 10/2016 MRI/A: Acute 563mR thalamic infarct, ? subacute infarct of R corona radiata; b. 08/2017 MRI/A: Acute 65m50materal L thalamic infarct. Other more remote lacunar infarcts of thalami bilat. Small vessel dzs; c. 08/2018 MRI/A: Acute lacunar infarct of the post limb of R internal capsule; d. 08/2019 MRI Acute CVA of L paramedian pons adn R cerebellar hemisphere.  . Tobacco abuse     Patient Active Problem List   Diagnosis Date Noted  . Hypertensive urgency 08/05/2020  . CKD (chronic kidney disease) stage 2, GFR 60-89 ml/min 06/27/2020  . Monitoring for anticoagulant use 06/27/2020  . Lower extremity edema 05/16/2020  . Paroxysmal atrial fibrillation (HCCSun[place]3/06/2020  . Chest pain of uncertain etiology 09/03/82/1660 History of CVA (cerebrovascular accident) 09/05/2019  . Hyperglycemia 09/05/2019  . Anemia 09/05/2019  . AKI (acute kidney injury) (HCCRemsen1/19/2021  . Atypical pneumonia 09/05/2019  . Coagulopathy (HCCHoma Hills0/26/2020  . Gastroesophageal reflux disease   . Hemiplegia and hemiparesis following cerebral infarction affecting left non-dominant side (  [place]) 11/25/2018  . 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 RBBB 10/15/2018  . Sinus bradycardia 10/15/2018  . Dysphagia, post-stroke   . Neuropathic pain   . Lacunar infarct, acute ([place]) 09/06/2018  . TIA (transient ischemic attack) 09/01/2018  . Microalbuminuria 07/29/2018  . Left leg pain 01/13/2018  . Hyperlipidemia LDL goal <70 02/11/2017  . Recurrent strokes ([place]) 11/10/2016  . Essential hypertension 11/10/2016  . Type 2 diabetes  mellitus with hypoglycemia without coma ([place]) 11/10/2016  . Tobacco abuse 08/28/2013    Past Surgical History:  Procedure Laterality Date  . ESOPHAGOGASTRODUODENOSCOPY (EGD) WITH PROPOFOL N/A 04/26/2019   Procedure: ESOPHAGOGASTRODUODENOSCOPY (EGD) WITH PROPOFOL;  Surgeon: Lin Landsman, MD;  Location: [place];  Service: Gastroenterology;  Laterality: N/A;    Prior to Admission medications   Medication Sig Start Date End Date Taking? Authorizing Provider  atorvastatin (LIPITOR) 40 MG tablet Take by mouth. 03/28/20   [provider]  baclofen (LIORESAL) 10 MG tablet TAKE ONE TABLET BY MOUTH THREE TIMES A DAY 07/16/20   Towanda Malkin, MD  Blood Glucose Monitoring Suppl (FIFTY50 GLUCOSE METER 2.0) w/Device KIT Use as instructed 05/05/18   [provider]  Blood Glucose Monitoring Suppl KIT Accucheck Guide with lancets and test strips #100 with one refill.  Test blood sugar twice daily. DX Code E11.65 ; Z79.4 07/09/20   Towanda Malkin, MD  ELIQUIS 5 MG TABS tablet TAKE 1 TABLET BY MOUTH 2 TIMES DAILY 05/03/20   End, Harrell Gave, MD  gabapentin (NEURONTIN) 300 MG capsule Take 2 capsules (600 mg total) by mouth 2 (two) times daily. 02/13/20   Delsa Grana, PA-C  glipiZIDE (GLUCOTROL) 5 MG tablet TAKE 1 TABLET BY MOUTH DAILY BEFORE BREAKFAST 03/21/20   Towanda Malkin, MD  hydrochlorothiazide (HYDRODIURIL) 25 MG tablet Take 25 mg by mouth daily. 03/20/20   [provider]  Insulin Pen Needle (NOVOFINE) 32G X 6 MM MISC 1 each by Does not apply route once a week. 07/18/19   Hubbard Hartshorn, FNP  LEVEMIR FLEXTOUCH 100 UNIT/ML FlexPen Inject into the skin. 05/28/20   [provider]  lisinopril (ZESTRIL) 40 MG tablet Take 1 tablet (40 mg total) by mouth daily. 02/13/20   Delsa Grana, PA-C  Misc. Devices MISC One pair of Compression stockings  Hemiplegia and hemiparesis following cerebral infarction affecting left non-dominant side ([place]) -  Primary Codes: E52.778 Lower extremity edema  Codes: R60.0 Type 2 diabetes mellitus with hyperglycemia, with long-term current use of insulin ([place])  Codes: E11.65, Z79.4 05-28-2020   Towanda Malkin, MD  omeprazole (PRILOSEC) 40 MG capsule TAKE 1 CAPSULE BY MOUTH DAILY BEFORE BREAKFAST 06/25/20 07/25/20  Jonathon Bellows, MD  pantoprazole (PROTONIX) 40 MG tablet TAKE 1 TABLET BY MOUTH DAILY BEFORE BREAKFAST 02/13/20   Delsa Grana, PA-C    Allergies Patient has no known allergies.  Family History  Problem Relation Age of Onset  . Hypertension Mother   . Stroke Mother        died @ age 74  . Hypertension Father   . Diabetes Father   . Heart attack Father        died @ 33    Social History Social History   Tobacco Use  . Smoking status: Former Smoker    Packs/day: 1.00    Years: 38.00    Pack years: 38.00    Types: Cigarettes    Start date: 1982  . Smokeless tobacco: Never Used  Vaping Use  . Vaping Use:  Never used  Substance Use Topics  . Alcohol use: Not Currently    Alcohol/week: 1.0 standard drink    Types: 1 Cans of beer per week    Comment: previously drank but nothing in 1-2 yrs (08/2019).  . Drug use: Not Currently    Types: Cocaine, Marijuana    Comment: prev used cocaine/marijuana but none x 1-2 yrs (08/2019).    Review of Systems  Constitutional: No fever/chills Eyes: No visual changes. ENT: No sore throat. Cardiovascular: Denies chest pain. Respiratory: Denies shortness of breath. Gastrointestinal: No abdominal pain.  No nausea, no vomiting.  No diarrhea.  No constipation. Genitourinary: Negative for dysuria. Musculoskeletal: Negative for back pain. Skin: Negative for rash. Neurological: Negative for focal weakness or numbness.  Positive for occipital headache.  ____________________________________________   PHYSICAL EXAM:  VITAL SIGNS: Vitals:   08/05/20 2000 08/05/20 2102  BP: (!) 189/90 (!) 172/81  Pulse: 65 60  Resp: 18 18  Temp:     SpO2: 98%      Constitutional: Alert and oriented. Well appearing and in no acute distress. Eyes: Conjunctivae are normal. PERRL. EOMI. Head: Atraumatic. Nose: No congestion/rhinnorhea. Mouth/Throat: Mucous membranes are moist.  Oropharynx non-erythematous. Neck: No stridor. No cervical spine tenderness to palpation. Cardiovascular: Normal rate, regular rhythm. Grossly normal heart sounds.  Good peripheral circulation. Respiratory: Normal respiratory effort.  No retractions. Lungs CTAB. Gastrointestinal: Soft , nondistended, nontender to palpation. No CVA tenderness. Musculoskeletal: No lower extremity tenderness nor edema.  No joint effusions. No signs of acute trauma. Neurologic:  Normal speech and language. No gross focal neurologic deficits are appreciated.  Contractures and mild weakness to his left arm and leg, at baseline, per the wife at the bedside.  No evidence of acute neurovascular deficits. Skin:  Skin is warm, dry and intact. No rash noted. Psychiatric: Mood and affect are normal. Speech and behavior are normal.  ____________________________________________   LABS (all labs ordered are listed, but only abnormal results are displayed)  Labs Reviewed  CBC - Abnormal; Notable for the following components:      Result Value   RBC 4.16 (*)    Hemoglobin 12.6 (*)    HCT 37.1 (*)    All other components within normal limits  COMPREHENSIVE METABOLIC PANEL - Abnormal; Notable for the following components:   Glucose, Bld 62 (*)    Alkaline Phosphatase 153 (*)    All other components within normal limits  RESP PANEL BY RT-PCR (FLU A&B, COVID) ARPGX2  BASIC METABOLIC PANEL  CBC  CBG MONITORING, ED  TROPONIN I (HIGH SENSITIVITY)  TROPONIN I (HIGH SENSITIVITY)   ____________________________________________  12 Lead EKG  Sinus rhythm, rate of 58 bpm.  Leftward axis.  Right bundle branch block.  No evidence of acute ischemia.  Similar to previous from  01/2020 ____________________________________________  RADIOLOGY  ED MD interpretation: CT head reviewed by me without evidence of acute intracranial pathology.  Official radiology report(s): CT Head Wo Contrast  Result Date: 08/05/2020 CLINICAL DATA:  Headache, hypertension EXAM: CT HEAD WITHOUT CONTRAST TECHNIQUE: Contiguous axial images were obtained from the base of the skull through the vertex without intravenous contrast. COMPARISON:  09/05/2018 FINDINGS: Brain: Chronic hypodensities within the right internal capsule and left thalamus consistent with chronic small vessel ischemic change. No evidence of acute infarct or hemorrhage. Lateral ventricles and midline structures are unremarkable. No acute extra-axial fluid collections. No mass effect. Vascular: No hyperdense vessel or unexpected calcification. Skull: Normal. Negative for fracture or focal lesion. Sinuses/Orbits:  Minimal mucoperiosteal thickening within the maxillary sinuses. Other: None. IMPRESSION: 1. Chronic small vessel ischemic changes. No acute intracranial process. Electronically Signed   By: Randa Ngo M.D.   On: 08/05/2020 20:43    ____________________________________________   PROCEDURES and INTERVENTIONS  Procedure(s) performed (including Critical Care):  .1-3 Lead EKG Interpretation Performed by: Vladimir Crofts, MD Authorized by: Vladimir Crofts, MD     Interpretation: normal     ECG rate:  62   ECG rate assessment: normal     Rhythm: sinus rhythm     Ectopy: none     Conduction: normal      Medications  insulin aspart (novoLOG) injection 0-9 Units (has no administration in time range)  enoxaparin (LOVENOX) injection 40 mg (has no administration in time range)  0.9 %  sodium chloride infusion (has no administration in time range)  acetaminophen (TYLENOL) tablet 650 mg (has no administration in time range)    Or  acetaminophen (TYLENOL) suppository 650 mg (has no administration in time range)   HYDROcodone-acetaminophen (NORCO/VICODIN) 5-325 MG per tablet 1-2 tablet (has no administration in time range)  senna-docusate (Senokot-S) tablet 1 tablet (has no administration in time range)  ondansetron (ZOFRAN) tablet 4 mg (has no administration in time range)    Or  ondansetron (ZOFRAN) injection 4 mg (has no administration in time range)  labetalol (NORMODYNE) injection 10 mg (has no administration in time range)  hydrALAZINE (APRESOLINE) injection 10 mg (10 mg Intravenous Given 08/05/20 1938)  hydrALAZINE (APRESOLINE) tablet 25 mg (25 mg Oral Given 08/05/20 2008)    ____________________________________________   MDM / ED COURSE   64 year old male presents to the ED with complaints of diaphoresis and hypotension at home, found to have rather elevated blood pressure consistent with hypertensive urgency and mild hypoglycemia requiring observation medical admission.  Patient hypertensive with systolics in the 017C, otherwise normal vitals on room air.  Exam is reassuring with no neurovascular deficits or signs of distress.  He has some mild weakness to his left arm and leg with associated contractures, chronic and at his baseline from recent strokes.  Blood work demonstrates hyperglycemia with a glucose of 62, otherwise unremarkable.  Patient tolerating p.o. intake and recheck of his CBG after p.o. is improved.  Due to his symptoms of headache with his hypertension, as well as being on anticoagulation, CT head obtained and does not show any evidence of acute ICH.  IV and oral hydralazine provided with improvement of his blood pressure.  Will admit the patient to hospitalist medicine for further observation, work-up and management.     ____________________________________________   FINAL CLINICAL IMPRESSION(S) / ED DIAGNOSES  Final diagnoses:  Hypoglycemia  Hypertensive urgency  Acute nonintractable headache, unspecified headache type     ED Discharge Orders    None        Rollin Kotowski   Note:  This document was prepared using Dragon voice recognition software and may include unintentional dictation errors.   Vladimir Crofts, MD 08/05/20 2115

## 2020-08-05 NOTE — ED Triage Notes (Signed)
Pt to ER states he took BP at home and it was elevated.  Pt states he was sweating a lot and that prompted him to take BP.  Pt states he called his PCP who told him to come here.  Pt denies any other s/s or c/o at this time.

## 2020-08-05 NOTE — H&P (Signed)
History and Physical    Evan Townsend OQH:[id number] DOB: 09-25-1955 DOA: 08/05/2020  PCP: Towanda Malkin, MD   Patient coming from: Home   Chief Complaint: High BP, sweating   HPI: ORION MOLE Sr. is a 64 y.o. male with medical history significant for paroxysmal atrial fibrillation on Eliquis, hypertension, insulin-dependent diabetes mellitus, and chronic kidney disease, now presenting to emergency department for evaluation of sweats and high blood pressure.  Patient reports that he did not have much available to eat today, had taken his medications as usual, and then developed sweating earlier today.  His wife took his blood pressure due to the sweating, found it to be elevated to 200/116, called his PCP office, and was directed to the ED.  Patient reports that he had a mild headache which has since resolved, denies any recent chest pain, and denies any shortness of breath, fevers, cough, or change in his chronic mild left lower extremity swelling.  Patient's wife helps him with his medications and reports that he takes only lisinopril now for blood pressure.  She notes that he had previously been on other antihypertensives which have been discontinued over the past several months.  Blood pressure is noted to have been 546 systolic at his last 2 outpatient rehab appointments this month.  Appears that HCTZ was discontinued early this year when his creatinine had increased.  He had been on amlodipine until recently, wife is unsure of why it was discontinued, and she agrees with resuming it.  ED Course: Upon arrival to the ED, patient is found to be afebrile, saturating well on room air, and hypertensive to as high as 194/81.  EKG features sinus bradycardia with rate 58.  Chronic RBBB, and LAD.  Noncontrast head CT is negative for acute intracranial abnormality.  Chemistry panel is notable for glucose of 62.  CBC with slight normocytic anemia.  High-sensitivity troponin is normal x2.   COVID-19 screening test is negative.  Patient was treated with IV and oral hydralazine in the ED and blood pressure has improved.  He was provided some food and glucose level has improved.  Hospitalist were consulted for admission.  Review of Systems:  All other systems reviewed and apart from HPI, are negative.  Past Medical History:  Diagnosis Date  . Carotid arterial disease ([place])    a. 08/2018 Carotid U/S: min-mod RICA atherosclerosis w/o hemodynamically significant stenosis. Nl LICA.  Marland Kitchen Chest pain    a. 08/2019 MV: EF 55%. No ischemia. Diaph attenuation noted.  . Diabetes 1.5, managed as type 2 ([place])   . Diastolic dysfunction    a. 08/2018 Echo: EF 65%. No rwma. Gr1 DD. Mild MR.  . Diastolic dysfunction    a. 08/2019 Echo: EF 55-60%, Gr1 DD. No rwma. Mild MR. RVSP 37.64mHg.  .Marland KitchenHypercholesterolemia   . Hypertension   . Left hemiparesis ([place]    a. Ambulated w/ [place]. Limited use of LUE.  .Marland KitchenPAF (paroxysmal atrial fibrillation) ([place]    a. 10/2019 Event Monitor: PAF; b. CHA2DS2VASc = 5-->Eliquis.  . Pain in both feet   . Poorly controlled diabetes mellitus ([facility]    a. 04/2019 A1c 13.8.  .Marland KitchenRecurrent strokes ([place]    a. 10/2016 MRI/A: Acute 561mR thalamic infarct, ? subacute infarct of R corona radiata; b. 08/2017 MRI/A: Acute 6m63materal L thalamic infarct. Other more remote lacunar infarcts of thalami bilat. Small vessel dzs; c. 08/2018 MRI/A: Acute lacunar infarct of the post limb of R internal  capsule; d. 08/2019 MRI Acute CVA of L paramedian pons adn R cerebellar hemisphere.  . Tobacco abuse     Past Surgical History:  Procedure Laterality Date  . ESOPHAGOGASTRODUODENOSCOPY (EGD) WITH PROPOFOL N/A 04/26/2019   Procedure: ESOPHAGOGASTRODUODENOSCOPY (EGD) WITH PROPOFOL;  Surgeon: Lin Landsman, MD;  Location: [facility];  Service: Gastroenterology;  Laterality: N/A;    Social History:   reports that he has quit smoking. His smoking use included cigarettes. He started smoking  about 39 years ago. He has a 38.00 pack-year smoking history. He has never used smokeless tobacco. He reports previous alcohol use of about 1.0 standard drink of alcohol per week. He reports previous drug use. Drugs: Cocaine and Marijuana.  No Known Allergies  Family History  Problem Relation Age of Onset  . Hypertension Mother   . Stroke Mother        died @ age 90  . Hypertension Father   . Diabetes Father   . Heart attack Father        died @ 32     Prior to Admission medications   Medication Sig Start Date End Date Taking? Authorizing Provider  atorvastatin (LIPITOR) 40 MG tablet Take by mouth. 03/28/20   [provider]  baclofen (LIORESAL) 10 MG tablet TAKE ONE TABLET BY MOUTH THREE TIMES A DAY 07/16/20   Towanda Malkin, MD  Blood Glucose Monitoring Suppl (FIFTY50 GLUCOSE METER 2.0) w/Device KIT Use as instructed 05/05/18   [provider]  Blood Glucose Monitoring Suppl KIT Accucheck Guide with lancets and test strips #100 with one refill.  Test blood sugar twice daily. DX Code E11.65 ; Z79.4 07/09/20   Towanda Malkin, MD  ELIQUIS 5 MG TABS tablet TAKE 1 TABLET BY MOUTH 2 TIMES DAILY 05/03/20   End, Harrell Gave, MD  gabapentin (NEURONTIN) 300 MG capsule Take 2 capsules (600 mg total) by mouth 2 (two) times daily. 02/13/20   Delsa Grana, PA-C  glipiZIDE (GLUCOTROL) 5 MG tablet TAKE 1 TABLET BY MOUTH DAILY BEFORE BREAKFAST 03/21/20   Towanda Malkin, MD  hydrochlorothiazide (HYDRODIURIL) 25 MG tablet Take 25 mg by mouth daily. 03/20/20   [provider]  Insulin Pen Needle (NOVOFINE) 32G X 6 MM MISC 1 each by Does not apply route once a week. 07/18/19   Hubbard Hartshorn, FNP  LEVEMIR FLEXTOUCH 100 UNIT/ML FlexPen Inject into the skin. 05/28/20   [provider]  lisinopril (ZESTRIL) 40 MG tablet Take 1 tablet (40 mg total) by mouth daily. 02/13/20   Delsa Grana, PA-C  Misc. Devices MISC One pair of Compression  stockings  Hemiplegia and hemiparesis following cerebral infarction affecting left non-dominant side ([place]) - Primary Codes: Z16.967 Lower extremity edema  Codes: R60.0 Type 2 diabetes mellitus with hyperglycemia, with long-term current use of insulin ([place])  Codes: E11.65, Z79.4 May 28, 2020   Towanda Malkin, MD  omeprazole (PRILOSEC) 40 MG capsule TAKE 1 CAPSULE BY MOUTH DAILY BEFORE BREAKFAST 06/25/20 07/25/20  Jonathon Bellows, MD  pantoprazole (PROTONIX) 40 MG tablet TAKE 1 TABLET BY MOUTH DAILY BEFORE BREAKFAST 02/13/20   Delsa Grana, PA-C    Physical Exam: Vitals:   08/05/20 1618 08/05/20 1910 08/05/20 2000 08/05/20 2102  BP: (!) 185/95 (!) 194/81 (!) 189/90 (!) 172/81  Pulse: 61 (!) 51 65 60  Resp: 20 18 18 18   Temp:      TempSrc:      SpO2: 99% 100% 98%   Weight:      Height:  Constitutional: NAD, calm  Eyes: PERTLA, lids and conjunctivae normal ENMT: Mucous membranes are moist. Posterior pharynx clear of any exudate or lesions.   Neck: normal, supple, no masses, no thyromegaly Respiratory: no wheezing, no crackles. No accessory muscle use.  Cardiovascular: S1 & S2 heard, regular rate and rhythm. No extremity edema.   Abdomen: No distension, no tenderness, soft. Bowel sounds active.  Musculoskeletal: no clubbing / cyanosis. No joint deformity upper and lower extremities.   Skin: no significant rashes, lesions, ulcers. Warm, dry, well-perfused. Neurologic: Dysarthria. Weak on left.   Psychiatric: Alert and oriented to person, place, and situation. Very pleasant and cooperative.    Labs and Imaging on Admission: I have personally reviewed following labs and imaging studies  CBC: Recent Labs  Lab 08/05/20 1408  WBC 4.9  HGB 12.6*  HCT 37.1*  MCV 89.2  PLT 263   Basic Metabolic Panel: Recent Labs  Lab 08/05/20 1408  NA 141  K 3.6  CL 106  CO2 25  GLUCOSE 62*  BUN 17  CREATININE 0.94  CALCIUM 9.3   GFR: Estimated Creatinine Clearance: 72.6 mL/min  (by C-G formula based on SCr of 0.94 mg/dL). Liver Function Tests: Recent Labs  Lab 08/05/20 1408  AST 26  ALT 41  ALKPHOS 153*  BILITOT 0.5  PROT 7.8  ALBUMIN 3.8   No results for input(s): LIPASE, AMYLASE in the last 168 hours. No results for input(s): AMMONIA in the last 168 hours. Coagulation Profile: No results for input(s): INR, PROTIME in the last 168 hours. Cardiac Enzymes: No results for input(s): CKTOTAL, CKMB, CKMBINDEX, TROPONINI in the last 168 hours. BNP (last 3 results) No results for input(s): PROBNP in the last 8760 hours. HbA1C: No results for input(s): HGBA1C in the last 72 hours. CBG: Recent Labs  Lab 08/05/20 2059  GLUCAP 76   Lipid Profile: No results for input(s): CHOL, HDL, LDLCALC, TRIG, CHOLHDL, LDLDIRECT in the last 72 hours. Thyroid Function Tests: No results for input(s): TSH, T4TOTAL, FREET4, T3FREE, THYROIDAB in the last 72 hours. Anemia Panel: No results for input(s): VITAMINB12, FOLATE, FERRITIN, TIBC, IRON, RETICCTPCT in the last 72 hours. Urine analysis:    Component Value Date/Time   COLORURINE YELLOW (A) 09/01/2018 1752   APPEARANCEUR CLEAR (A) 09/01/2018 1752   LABSPEC 1.018 09/01/2018 1752   PHURINE 6.0 09/01/2018 1752   GLUCOSEU 150 (A) 09/01/2018 1752   HGBUR NEGATIVE 09/01/2018 1752   BILIRUBINUR NEGATIVE 09/01/2018 1752   KETONESUR NEGATIVE 09/01/2018 1752   PROTEINUR 100 (A) 09/01/2018 1752   NITRITE NEGATIVE 09/01/2018 1752   LEUKOCYTESUR NEGATIVE 09/01/2018 1752   Sepsis Labs: @LABRCNTIP (procalcitonin:4,lacticidven:4) ) Recent Results (from the past 240 hour(s))  Resp Panel by RT-PCR (Flu A&B, Covid) Nasopharyngeal Swab     Status: None   Collection Time: 08/05/20  7:24 PM   Specimen: Nasopharyngeal Swab; Nasopharyngeal(NP) swabs in vial transport medium  Result Value Ref Range Status   SARS Coronavirus 2 by RT PCR NEGATIVE NEGATIVE Final    Comment: (NOTE) SARS-CoV-2 target nucleic acids are NOT DETECTED.  The  SARS-CoV-2 RNA is generally detectable in upper respiratory specimens during the acute phase of infection. The lowest concentration of SARS-CoV-2 viral copies this assay can detect is 138 copies/mL. A negative result does not preclude SARS-Cov-2 infection and should not be used as the sole basis for treatment or other patient management decisions. A negative result may occur with  improper specimen collection/handling, submission of specimen other than nasopharyngeal swab, presence of viral mutation(s)  within the areas targeted by this assay, and inadequate number of viral copies(<138 copies/mL). A negative result must be combined with clinical observations, patient history, and epidemiological information. The expected result is Negative.  Fact Sheet for Patients:  EntrepreneurPulse.com.au  Fact Sheet for Healthcare Providers:  IncredibleEmployment.be  This test is no t yet approved or cleared by the [place] FDA and  has been authorized for detection and/or diagnosis of SARS-CoV-2 by FDA under an Emergency Use Authorization (EUA). This EUA will remain  in effect (meaning this test can be used) for the duration of the COVID-19 declaration under Section 564(b)(1) of the Act, 21 U.S.C.section 360bbb-3(b)(1), unless the authorization is terminated  or revoked sooner.       Influenza A by PCR NEGATIVE NEGATIVE Final   Influenza B by PCR NEGATIVE NEGATIVE Final    Comment: (NOTE) The Xpert Xpress SARS-CoV-2/FLU/RSV plus assay is intended as an aid in the diagnosis of influenza from Nasopharyngeal swab specimens and should not be used as a sole basis for treatment. Nasal washings and aspirates are unacceptable for Xpert Xpress SARS-CoV-2/FLU/RSV testing.  Fact Sheet for Patients: EntrepreneurPulse.com.au  Fact Sheet for Healthcare Providers: IncredibleEmployment.be  This test is not yet approved or  cleared by the [place] FDA and has been authorized for detection and/or diagnosis of SARS-CoV-2 by FDA under an Emergency Use Authorization (EUA). This EUA will remain in effect (meaning this test can be used) for the duration of the COVID-19 declaration under Section 564(b)(1) of the Act, 21 U.S.C. section 360bbb-3(b)(1), unless the authorization is terminated or revoked.  Performed at [facility], [address] [place], [place] [address]      Radiological Exams on Admission: CT Head Wo Contrast  Result Date: 08/05/2020 CLINICAL DATA:  Headache, hypertension EXAM: CT HEAD WITHOUT CONTRAST TECHNIQUE: Contiguous axial images were obtained from the base of the skull through the vertex without intravenous contrast. COMPARISON:  09/05/2018 FINDINGS: Brain: Chronic hypodensities within the right internal capsule and left thalamus consistent with chronic small vessel ischemic change. No evidence of acute infarct or hemorrhage. Lateral ventricles and midline structures are unremarkable. No acute extra-axial fluid collections. No mass effect. Vascular: No hyperdense vessel or unexpected calcification. Skull: Normal. Negative for fracture or focal lesion. Sinuses/Orbits: Minimal mucoperiosteal thickening within the maxillary sinuses. Other: None. IMPRESSION: 1. Chronic small vessel ischemic changes. No acute intracranial process. Electronically Signed   By: Randa Ngo M.D.   On: 08/05/2020 20:43    EKG: Independently reviewed. Sinus bradycardia, rate 58, RBBB, LAD, non-specific T-wave abnormality similar to prior.   Assessment/Plan   1. Hypertensive urgency  - Presents for evaluation of home BP reading of 200/116  - BP as high as 194/81 in ED, asymptomatic  - Per chart review and discussion with patient's wife, he takes lisinopril only now, had been on HCTZ and Norvasc as well earlier this year, and had SBP 200 at last 2 outpatient rehab appointments  - HCTZ appears to have  been stopped due to increased creatinine  - In discussion with patient's wife, will plan to resume Norvasc, continue monitoring, and use hydralazine as needed for now    2. Hypoglycemia; IDDM  - A1c was 10.9% in November 2021  - Serum glucose was 62 in ED, improved with food  - Pharmacy medication-reconciliation pending, will monitor CBGs and use only a low-intensity SSI if needed for now    3. Paroxysmal atrial fibrillation  - In sinus rhythm on admission  - CHADS-VASc  is at least 4 (CVA x2, HTN, DM)  - Continue Eliquis    4. History of CVAs  - Patient has hx of CVAs with residual left-sided weakness and dysarthria  - He has no new neurologic complaints and there are no acute findings on CT head in ED  - Continue Lipitor and Eliquis   5. CKD stage 2  - SCr is 0.94 on admission, lower than priors     DVT prophylaxis: Eliquis  Code Status: Full  Family Communication: Wife updated by phone  Disposition Plan:  Patient is from: home  Anticipated d/c is to: Home  Anticipated d/c date is: 08/06/20 Patient currently: Pending BP and glucose monitoring  Consults called: none  Admission status: Observation     Vianne Bulls, MD [facility]  08/05/2020, 9:41 PM

## 2020-08-05 NOTE — ED Notes (Signed)
Report off to michelle rn 

## 2020-08-05 NOTE — ED Notes (Signed)
Pt reports elevated blood pressure.  Sx began today.  Pt reports taking his meds.  Denies h/a  No n/v.  No dizziness.  Pt from home.  Pt alert  Speech clear.  Family with pt

## 2020-08-05 NOTE — Telephone Encounter (Signed)
Patient's wife called and says the patient has a BP of 200 something over 116. She says she just checked it about 10 minutes ago when she got home from work. She says he is sweating, but no other symptoms. She says he has not missed any medicines. I advised to go to the ED, no openings in the office today. She say she will take him there.  Reason for Disposition . [1] Systolic BP  >= 160 OR Diastolic >= 100 AND [2] cardiac or neurologic symptoms (e.g., chest pain, difficulty breathing, unsteady gait, blurred vision)  Answer Assessment - Initial Assessment Questions 1. BLOOD PRESSURE: "What is the blood pressure?" "Did you take at least two measurements 5 minutes apart?"     200's/116 2. ONSET: "When did you take your blood pressure?"     10 minutes ago 3. HOW: "How did you obtain the blood pressure?" (e.g., visiting nurse, automatic home BP monitor)     Automatic BP monitor 4. HISTORY: "Do you have a history of high blood pressure?"     Yes 5. MEDICATIONS: "Are you taking any medications for blood pressure?" "Have you missed any doses recently?"     Yes, no missed doses 6. OTHER SYMPTOMS: "Do you have any symptoms?" (e.g., headache, chest pain, blurred vision, difficulty breathing, weakness)     Sweating 7. PREGNANCY: "Is there any chance you are pregnant?" "When was your last menstrual period?"     N/A  Protocols used: BLOOD PRESSURE - HIGH-A-AH

## 2020-08-05 NOTE — ED Notes (Signed)
Patient transported to CT 

## 2020-08-06 ENCOUNTER — Ambulatory Visit: Payer: Medicaid Other | Admitting: Gastroenterology

## 2020-08-06 ENCOUNTER — Encounter: Payer: Self-pay | Admitting: Family Medicine

## 2020-08-06 DIAGNOSIS — I160 Hypertensive urgency: Secondary | ICD-10-CM | POA: Diagnosis not present

## 2020-08-06 LAB — CBC
HCT: 35.8 % — ABNORMAL LOW (ref 39.0–52.0)
Hemoglobin: 11.8 g/dL — ABNORMAL LOW (ref 13.0–17.0)
MCH: 29.1 pg (ref 26.0–34.0)
MCHC: 33 g/dL (ref 30.0–36.0)
MCV: 88.2 fL (ref 80.0–100.0)
Platelets: 254 10*3/uL (ref 150–400)
RBC: 4.06 MIL/uL — ABNORMAL LOW (ref 4.22–5.81)
RDW: 14.8 % (ref 11.5–15.5)
WBC: 5 10*3/uL (ref 4.0–10.5)
nRBC: 0 % (ref 0.0–0.2)

## 2020-08-06 LAB — BASIC METABOLIC PANEL
Anion gap: 9 (ref 5–15)
BUN: 16 mg/dL (ref 8–23)
CO2: 25 mmol/L (ref 22–32)
Calcium: 9.4 mg/dL (ref 8.9–10.3)
Chloride: 106 mmol/L (ref 98–111)
Creatinine, Ser: 0.96 mg/dL (ref 0.61–1.24)
GFR, Estimated: >60 mL/min (ref >60)
Glucose, Bld: 139 mg/dL — ABNORMAL HIGH (ref 70–99)
Potassium: 3.5 mmol/L (ref 3.5–5.1)
Sodium: 140 mmol/L (ref 135–145)

## 2020-08-06 LAB — GLUCOSE, CAPILLARY: Glucose-Capillary: 117 mg/dL — ABNORMAL HIGH (ref 70–99)

## 2020-08-06 MED ORDER — HYDROCHLOROTHIAZIDE 25 MG PO TABS: 25.0000 mg | ORAL_TABLET | Freq: Every day | ORAL | 1 refills | Status: DC

## 2020-08-06 MED ORDER — HYDROCHLOROTHIAZIDE 25 MG PO TABS
25.0000 mg | ORAL_TABLET | Freq: Every day | ORAL | Status: DC
  Administered 2020-08-06: 25 mg via ORAL
  Filled 2020-08-06: qty 1

## 2020-08-06 MED ORDER — AMLODIPINE BESYLATE 10 MG PO TABS: 10.0000 mg | ORAL_TABLET | Freq: Every day | ORAL | 1 refills | Status: DC

## 2020-08-06 NOTE — Progress Notes (Signed)
Discharge note:  Patient discharged home today. Discharge instructions and work note provided to patient. Verbalized understanding. IV removed. Driven home by wife.   Arlana Hove, RN

## 2020-08-06 NOTE — Progress Notes (Signed)
Pt arived to room 160 via w/c. Is alert and orientated x4. Has some left sided weakness. Skin intact. Able to voice needs to staff. Orientated to unit and staff . In bed resting quietly at this time. Call light within reach Dose use a walking can.

## 2020-08-06 NOTE — Discharge Summary (Signed)
Physician Discharge Summary  Evan TESAR Sr. IPJ:[id number] DOB: Dec 18, 1955 DOA: 08/05/2020  PCP: Towanda Malkin, MD  Admit date: 08/05/2020 Discharge date: 08/06/2020  Admitted From: Home Disposition:  Home  Discharge Condition:Stable CODE STATUS:FULL Diet recommendation: Heart Healthy  Brief/Interim Summary:   HPI: Evan Kiehn. is a 63 y.o. male with medical history significant for paroxysmal atrial fibrillation on Eliquis, hypertension, insulin-dependent diabetes mellitus, and chronic kidney disease, now presenting to emergency department for evaluation of sweats and high blood pressure.  Patient reports that he did not have much available to eat today, had taken his medications as usual, and then developed sweating earlier today.  His wife took his blood pressure due to the sweating, found it to be elevated to 200/116, called his PCP office, and was directed to the ED.  Patient reports that he had a mild headache which has since resolved, denies any recent chest pain, and denies any shortness of breath, fevers, cough, or change in his chronic mild left lower extremity swelling.  Patient's wife helps him with his medications and reports that he takes only lisinopril now for blood pressure.  She notes that he had previously been on other antihypertensives which have been discontinued over the past several months.  Blood pressure is noted to have been 734 systolic at his last 2 outpatient rehab appointments this month.  Appears that HCTZ was discontinued early this year when his creatinine had increased.  He had been on amlodipine until recently, wife is unsure of why it was discontinued, and she agrees with resuming it.  ED Course: Upon arrival to the ED, patient is found to be afebrile, saturating well on room air, and hypertensive to as high as 194/81.  EKG features sinus bradycardia with rate 58.  Chronic RBBB, and LAD.  Noncontrast head CT is negative for acute  intracranial abnormality.  Chemistry panel is notable for glucose of 62.  CBC with slight normocytic anemia.  High-sensitivity troponin is normal x2.  COVID-19 screening test is negative.  Patient was treated with IV and oral hydralazine in the ED and blood pressure has improved.  He was provided some food and glucose level has improved.  Hospitalist were consulted for admission.   Hospital course:  Hospital course remained stable.  Patient seen and examined at the bedside this morning.  Blood pressure has improved and his systolic blood pressure was in the range of 170s.  He was comfortable, denies any complaint.  We  sending him home with oral antihypertensives.  He was taking lisinopril 40 mg daily at home.  We will recommend to continue hydrochlorothiazide 25 mg and add amlodipine 10 mg daily.  I called the wife and discussed about my discharge planning. Patient is medically stable for discharge home today. I have recommended to monitor blood pressure at home and follow-up with his PCP in a week.  Discharge Diagnoses:  Principal Problem:   Hypertensive urgency Active Problems:   Recurrent strokes (HCC)   Type 2 diabetes mellitus with hypoglycemia without coma (HCC)   Coagulopathy (HCC)   Paroxysmal atrial fibrillation (HCC)   CKD (chronic kidney disease) stage 2, GFR 60-89 ml/min    Discharge Instructions  Discharge Instructions    Diet - low sodium heart healthy   Complete by: As directed    Discharge instructions   Complete by: As directed    1)Please take prescribed medications as instructed 2)Follow up with your PCP in a week 3)Monitor blood pressure at home.  Increase activity slowly   Complete by: As directed      Allergies as of 08/06/2020   No Known Allergies     Medication List    TAKE these medications   amLODipine 10 MG tablet Commonly known as: NORVASC Take 1 tablet (10 mg total) by mouth daily. Start taking on: August 07, 2020   atorvastatin 40 MG  tablet Commonly known as: LIPITOR Take by mouth.   baclofen 10 MG tablet Commonly known as: LIORESAL TAKE ONE TABLET BY MOUTH THREE TIMES A DAY   Eliquis 5 MG Tabs tablet Generic drug: apixaban TAKE 1 TABLET BY MOUTH 2 TIMES DAILY   Fifty50 Glucose Meter 2.0 w/Device Kit Use as instructed   Blood Glucose Monitoring Suppl Kit Accucheck Guide with lancets and test strips #100 with one refill.  Test blood sugar twice daily. DX Code E11.65 ; Z79.4   gabapentin 300 MG capsule Commonly known as: NEURONTIN Take 2 capsules (600 mg total) by mouth 2 (two) times daily.   glipiZIDE 5 MG tablet Commonly known as: GLUCOTROL TAKE 1 TABLET BY MOUTH DAILY BEFORE BREAKFAST   hydrochlorothiazide 25 MG tablet Commonly known as: HYDRODIURIL Take 1 tablet (25 mg total) by mouth daily.   Levemir FlexTouch 100 UNIT/ML FlexPen Generic drug: insulin detemir Inject into the skin.   lisinopril 40 MG tablet Commonly known as: ZESTRIL Take 1 tablet (40 mg total) by mouth daily.   Misc. Devices Misc One pair of Compression stockings  Hemiplegia and hemiparesis following cerebral infarction affecting left non-dominant side (HCC) - Primary Codes: W23.762 Lower extremity edema  Codes: R60.0 Type 2 diabetes mellitus with hyperglycemia, with long-term current use of insulin (HCC)  Codes: E11.65, Z79.4   NovoFine 32G X 6 MM Misc Generic drug: Insulin Pen Needle 1 each by Does not apply route once a week.   omeprazole 40 MG capsule Commonly known as: PRILOSEC TAKE 1 CAPSULE BY MOUTH DAILY BEFORE BREAKFAST   pantoprazole 40 MG tablet Commonly known as: PROTONIX TAKE 1 TABLET BY MOUTH DAILY BEFORE BREAKFAST       Follow-up Information    Towanda Malkin, MD. Schedule an appointment as soon as possible for a visit in 1 week(s).   Specialty: Internal Medicine Contact information: [address] [address] [phone]              No Known  Allergies  Consultations:  None   Procedures/Studies: CT Head Wo Contrast  Result Date: 08/05/2020 CLINICAL DATA:  Headache, hypertension EXAM: CT HEAD WITHOUT CONTRAST TECHNIQUE: Contiguous axial images were obtained from the base of the skull through the vertex without intravenous contrast. COMPARISON:  09/05/2018 FINDINGS: Brain: Chronic hypodensities within the right internal capsule and left thalamus consistent with chronic small vessel ischemic change. No evidence of acute infarct or hemorrhage. Lateral ventricles and midline structures are unremarkable. No acute extra-axial fluid collections. No mass effect. Vascular: No hyperdense vessel or unexpected calcification. Skull: Normal. Negative for fracture or focal lesion. Sinuses/Orbits: Minimal mucoperiosteal thickening within the maxillary sinuses. Other: None. IMPRESSION: 1. Chronic small vessel ischemic changes. No acute intracranial process. Electronically Signed   By: Randa Ngo M.D.   On: 08/05/2020 20:43       Subjective:  Patient seen and examined at the bedside this morning.  Comfortable.  Medically stable for discharge Discharge Exam: Vitals:   08/06/20 0502 08/06/20 0738  BP: (!) 168/91 (!) 177/91  Pulse: 77 79  Resp: 19 16  Temp: 97.7  F (36.5 C) 98 F (36.7 C)  SpO2: 100% 100%   Vitals:   08/05/20 2300 08/06/20 0000 08/06/20 0502 08/06/20 0738  BP: (!) 156/81 (!) 186/96 (!) 168/91 (!) 177/91  Pulse:  80 77 79  Resp:  19 19 16   Temp:   97.7 F (36.5 C) 98 F (36.7 C)  TempSrc:      SpO2:  100% 100% 100%  Weight:      Height:        General: Pt is alert, awake, not in acute distress Cardiovascular: RRR, S1/S2 +, no rubs, no gallops Respiratory: CTA bilaterally, no wheezing, no rhonchi Abdominal: Soft, NT, ND, bowel sounds + Extremities: no edema, no cyanosis    The results of significant diagnostics from this hospitalization (including imaging, microbiology, ancillary and laboratory) are  listed below for reference.     Microbiology: Recent Results (from the past 240 hour(s))  Resp Panel by RT-PCR (Flu A&B, Covid) Nasopharyngeal Swab     Status: None   Collection Time: 08/05/20  7:24 PM   Specimen: Nasopharyngeal Swab; Nasopharyngeal(NP) swabs in vial transport medium  Result Value Ref Range Status   SARS Coronavirus 2 by RT PCR NEGATIVE NEGATIVE Final    Comment: (NOTE) SARS-CoV-2 target nucleic acids are NOT DETECTED.  The SARS-CoV-2 RNA is generally detectable in upper respiratory specimens during the acute phase of infection. The lowest concentration of SARS-CoV-2 viral copies this assay can detect is 138 copies/mL. A negative result does not preclude SARS-Cov-2 infection and should not be used as the sole basis for treatment or other patient management decisions. A negative result may occur with  improper specimen collection/handling, submission of specimen other than nasopharyngeal swab, presence of viral mutation(s) within the areas targeted by this assay, and inadequate number of viral copies(<138 copies/mL). A negative result must be combined with clinical observations, patient history, and epidemiological information. The expected result is Negative.  Fact Sheet for Patients:  EntrepreneurPulse.com.au  Fact Sheet for Healthcare Providers:  IncredibleEmployment.be  This test is no t yet approved or cleared by the [place] FDA and  has been authorized for detection and/or diagnosis of SARS-CoV-2 by FDA under an Emergency Use Authorization (EUA). This EUA will remain  in effect (meaning this test can be used) for the duration of the COVID-19 declaration under Section 564(b)(1) of the Act, 21 U.S.C.section 360bbb-3(b)(1), unless the authorization is terminated  or revoked sooner.       Influenza A by PCR NEGATIVE NEGATIVE Final   Influenza B by PCR NEGATIVE NEGATIVE Final    Comment: (NOTE) The Xpert Xpress  SARS-CoV-2/FLU/RSV plus assay is intended as an aid in the diagnosis of influenza from Nasopharyngeal swab specimens and should not be used as a sole basis for treatment. Nasal washings and aspirates are unacceptable for Xpert Xpress SARS-CoV-2/FLU/RSV testing.  Fact Sheet for Patients: EntrepreneurPulse.com.au  Fact Sheet for Healthcare Providers: IncredibleEmployment.be  This test is not yet approved or cleared by the [place] FDA and has been authorized for detection and/or diagnosis of SARS-CoV-2 by FDA under an Emergency Use Authorization (EUA). This EUA will remain in effect (meaning this test can be used) for the duration of the COVID-19 declaration under Section 564(b)(1) of the Act, 21 U.S.C. section 360bbb-3(b)(1), unless the authorization is terminated or revoked.  Performed at [facility], [address] [place],  [address]      Labs: BNP (last 3 results) No results for input(s): BNP in the last 8760 hours.  Basic Metabolic Panel: Recent Labs  Lab 08/05/20 1408 08/06/20 0629  NA 141 140  K 3.6 3.5  CL 106 106  CO2 25 25  GLUCOSE 62* 139*  BUN 17 16  CREATININE 0.94 0.96  CALCIUM 9.3 9.4   Liver Function Tests: Recent Labs  Lab 08/05/20 1408  AST 26  ALT 41  ALKPHOS 153*  BILITOT 0.5  PROT 7.8  ALBUMIN 3.8   No results for input(s): LIPASE, AMYLASE in the last 168 hours. No results for input(s): AMMONIA in the last 168 hours. CBC: Recent Labs  Lab 08/05/20 1408 08/06/20 0629  WBC 4.9 5.0  HGB 12.6* 11.8*  HCT 37.1* 35.8*  MCV 89.2 88.2  PLT 262 254   Cardiac Enzymes: No results for input(s): CKTOTAL, CKMB, CKMBINDEX, TROPONINI in the last 168 hours. BNP: Invalid input(s): POCBNP CBG: Recent Labs  Lab 08/05/20 2059 08/05/20 2245 08/06/20 0741  GLUCAP 76 80 117*   D-Dimer No results for input(s): DDIMER in the last 72 hours. Hgb A1c No results for input(s): HGBA1C in the  last 72 hours. Lipid Profile No results for input(s): CHOL, HDL, LDLCALC, TRIG, CHOLHDL, LDLDIRECT in the last 72 hours. Thyroid function studies No results for input(s): TSH, T4TOTAL, T3FREE, THYROIDAB in the last 72 hours.  Invalid input(s): FREET3 Anemia work up No results for input(s): VITAMINB12, FOLATE, FERRITIN, TIBC, IRON, RETICCTPCT in the last 72 hours. Urinalysis    Component Value Date/Time   COLORURINE YELLOW (A) 09/01/2018 1752   APPEARANCEUR CLEAR (A) 09/01/2018 1752   LABSPEC 1.018 09/01/2018 1752   PHURINE 6.0 09/01/2018 1752   GLUCOSEU 150 (A) 09/01/2018 1752   HGBUR NEGATIVE 09/01/2018 1752   BILIRUBINUR NEGATIVE 09/01/2018 1752   KETONESUR NEGATIVE 09/01/2018 1752   PROTEINUR 100 (A) 09/01/2018 1752   NITRITE NEGATIVE 09/01/2018 1752   LEUKOCYTESUR NEGATIVE 09/01/2018 1752   Sepsis Labs Invalid input(s): PROCALCITONIN,  WBC,  LACTICIDVEN Microbiology Recent Results (from the past 240 hour(s))  Resp Panel by RT-PCR (Flu A&B, Covid) Nasopharyngeal Swab     Status: None   Collection Time: 08/05/20  7:24 PM   Specimen: Nasopharyngeal Swab; Nasopharyngeal(NP) swabs in vial transport medium  Result Value Ref Range Status   SARS Coronavirus 2 by RT PCR NEGATIVE NEGATIVE Final    Comment: (NOTE) SARS-CoV-2 target nucleic acids are NOT DETECTED.  The SARS-CoV-2 RNA is generally detectable in upper respiratory specimens during the acute phase of infection. The lowest concentration of SARS-CoV-2 viral copies this assay can detect is 138 copies/mL. A negative result does not preclude SARS-Cov-2 infection and should not be used as the sole basis for treatment or other patient management decisions. A negative result may occur with  improper specimen collection/handling, submission of specimen other than nasopharyngeal swab, presence of viral mutation(s) within the areas targeted by this assay, and inadequate number of viral copies(<138 copies/mL). A negative result  must be combined with clinical observations, patient history, and epidemiological information. The expected result is Negative.  Fact Sheet for Patients:  EntrepreneurPulse.com.au  Fact Sheet for Healthcare Providers:  IncredibleEmployment.be  This test is no t yet approved or cleared by the [place] FDA and  has been authorized for detection and/or diagnosis of SARS-CoV-2 by FDA under an Emergency Use Authorization (EUA). This EUA will remain  in effect (meaning this test can be used) for the duration of the COVID-19 declaration under Section 564(b)(1) of the Act, 21 U.S.C.section 360bbb-3(b)(1), unless the authorization is terminated  or revoked sooner.  Influenza A by PCR NEGATIVE NEGATIVE Final   Influenza B by PCR NEGATIVE NEGATIVE Final    Comment: (NOTE) The Xpert Xpress SARS-CoV-2/FLU/RSV plus assay is intended as an aid in the diagnosis of influenza from Nasopharyngeal swab specimens and should not be used as a sole basis for treatment. Nasal washings and aspirates are unacceptable for Xpert Xpress SARS-CoV-2/FLU/RSV testing.  Fact Sheet for Patients: EntrepreneurPulse.com.au  Fact Sheet for Healthcare Providers: IncredibleEmployment.be  This test is not yet approved or cleared by the [place] FDA and has been authorized for detection and/or diagnosis of SARS-CoV-2 by FDA under an Emergency Use Authorization (EUA). This EUA will remain in effect (meaning this test can be used) for the duration of the COVID-19 declaration under Section 564(b)(1) of the Act, 21 U.S.C. section 360bbb-3(b)(1), unless the authorization is terminated or revoked.  Performed at [facility], [address] [place], [place] [address]     Please note: You were cared for by a hospitalist during your hospital stay. Once you are discharged, your primary care physician will handle any further  medical issues. Please note that NO REFILLS for any discharge medications will be authorized once you are discharged, as it is imperative that you return to your primary care physician (or establish a relationship with a primary care physician if you do not have one) for your post hospital discharge needs so that they can reassess your need for medications and monitor your lab values.    Time coordinating discharge: 40 minutes  SIGNED:   Shelly Coss, MD  [facility] 08/06/2020, 10:57 AM Pager [phone]  If 7PM-7AM, please contact night-coverage [email or web address] Password [email or web address]

## 2020-08-07 ENCOUNTER — Ambulatory Visit: Payer: Medicaid Other

## 2020-08-12 ENCOUNTER — Ambulatory Visit: Payer: Medicaid Other | Admitting: Podiatry

## 2020-08-14 ENCOUNTER — Ambulatory Visit: Payer: Medicaid Other

## 2020-08-15 ENCOUNTER — Other Ambulatory Visit: Payer: Self-pay

## 2020-08-15 ENCOUNTER — Ambulatory Visit (INDEPENDENT_AMBULATORY_CARE_PROVIDER_SITE_OTHER): Payer: Medicaid Other | Admitting: Internal Medicine

## 2020-08-15 ENCOUNTER — Encounter: Payer: Self-pay | Admitting: Internal Medicine

## 2020-08-15 VITALS — BP 160/90 | HR 68 | Ht 66.0 in | Wt 165.0 lb

## 2020-08-15 DIAGNOSIS — I10 Essential (primary) hypertension: Secondary | ICD-10-CM | POA: Diagnosis not present

## 2020-08-15 DIAGNOSIS — I480 Paroxysmal atrial fibrillation: Secondary | ICD-10-CM

## 2020-08-15 DIAGNOSIS — R9431 Abnormal electrocardiogram [ECG] [EKG]: Secondary | ICD-10-CM

## 2020-08-15 MED ORDER — APIXABAN 5 MG PO TABS: 5.0000 mg | ORAL_TABLET | Freq: Two times a day (BID) | ORAL | 0 refills | Status: DC

## 2020-08-15 MED ORDER — HYDROCHLOROTHIAZIDE 25 MG PO TABS: 25.0000 mg | ORAL_TABLET | Freq: Every day | ORAL | 0 refills | Status: DC

## 2020-08-15 MED ORDER — LISINOPRIL 40 MG PO TABS: 40.0000 mg | ORAL_TABLET | Freq: Every day | ORAL | 3 refills | Status: DC

## 2020-08-15 MED ORDER — AMLODIPINE BESYLATE 10 MG PO TABS: 10.0000 mg | ORAL_TABLET | Freq: Every day | ORAL | 0 refills | Status: DC

## 2020-08-15 MED ORDER — ATORVASTATIN CALCIUM 40 MG PO TABS: 40.0000 mg | ORAL_TABLET | Freq: Every day | ORAL | 0 refills | Status: DC

## 2020-08-15 MED ORDER — SPIRONOLACTONE 25 MG PO TABS
25.0000 mg | ORAL_TABLET | Freq: Every day | ORAL | 1 refills | Status: DC
Start: 2020-08-15 — End: 2020-10-03

## 2020-08-15 NOTE — Patient Instructions (Signed)
Medication Instructions:  Your physician has recommended you make the following change in your medication:   START Spironolactone 25mg  - take 1 tablet daily  *If you need a refill on your cardiac medications before your next appointment, please call your pharmacy*   Lab Work:  -  Your physician recommends that you return for lab work in: ONE WEEK at the medical mall for [facility] -  Please go to the [facility]. You will check in at the front desk to the right as you walk into the atrium. Valet Parking is offered if needed. - No appointment needed. You may go any day between 7 am and 6 pm.   If you have labs (blood work) drawn today and your tests are completely normal, you will receive your results only by: [facility] MyChart Message (if you have MyChart) OR . A paper copy in the mail If you have any lab test that is abnormal or we need to change your treatment, we will call you to review the results.   Testing/Procedures: None ordered   Follow-Up: At [facility], you and your health needs are our priority.  As part of our continuing mission to provide you with exceptional heart care, we have created designated Provider Care Teams.  These Care Teams include your primary Cardiologist (physician) and Advanced Practice Providers (APPs -  Physician Assistants and Nurse Practitioners) who all work together to provide you with the care you need, when you need it.  We recommend signing up for the patient portal called "MyChart".  Sign up information is provided on this After Visit Summary.  MyChart is used to connect with patients for Virtual Visits (Telemedicine).  Patients are able to view lab/test results, encounter notes, upcoming appointments, etc.  Non-urgent messages can be sent to your provider as well.   To learn more about what you can do with MyChart, go to [facility].    Your next appointment:   1 month(s)  The format for your next appointment:   In Person  Provider:    You will see one of the following Advanced Practice Providers on your designated Care Team:    [email or web address], NP  Nicolasa Ducking, PA-C  Eula Listen, PA-C  Cadence Salmon, [place]  [place], NP

## 2020-08-15 NOTE — Progress Notes (Signed)
Follow-up Outpatient Visit Date: 08/15/2020  Primary Care Provider: Towanda Malkin, MD [address] [place] [address]  Chief Complaint: Follow-up atrial fibrillation and stroke  HPI:  Evan Townsend is a 64 y.o. male with history of multiple strokes with left hemiparesis, hypertension, hyperlipidemia, uncontrolled diabetes mellitus, and tobacco use, who presents for follow-up of paroxysmal atrial fibrillation complicated by multiple strokes.  I last saw Evan Townsend in June after event monitor showed PAF.  He was started on apixaban and has been doing well without any new neurologic changes.  His only complaint is of left-sided swelling in the setting of hemiparesis.  He has not had any chest pain, shortness of breath, palpitations, lightheadedness, or edema.  Evan Townsend was hospitalized at [facility] last week due to hypertensive urgency.  He has been unable to participate in PT since then out of concerns for high blood pressure.  --------------------------------------------------------------------------------------------------  Past Medical History:  Diagnosis Date  . Carotid arterial disease ([place])    a. 08/2018 Carotid U/S: min-mod RICA atherosclerosis w/o hemodynamically significant stenosis. Nl LICA.  Marland Kitchen Chest pain    a. 08/2019 MV: EF 55%. No ischemia. Diaph attenuation noted.  . Diabetes 1.5, managed as type 2 ([place])   . Diastolic dysfunction    a. 08/2018 Echo: EF 65%. No rwma. Gr1 DD. Mild MR.  . Diastolic dysfunction    a. 08/2019 Echo: EF 55-60%, Gr1 DD. No rwma. Mild MR. RVSP 37.23mHg.  .Marland KitchenHypercholesterolemia   . Hypertension   . Left hemiparesis ([place]    a. Ambulated w/ Cane. Limited use of LUE.  .Marland KitchenPAF (paroxysmal atrial fibrillation) ([place]    a. 10/2019 Event Monitor: PAF; b. CHA2DS2VASc = 5-->Eliquis.  . Pain in both feet   . Poorly controlled diabetes mellitus ([place]    a. 04/2019 A1c 13.8.  .Marland KitchenRecurrent strokes ([place]    a. 10/2016 MRI/A: Acute 554mR  thalamic infarct, ? subacute infarct of R corona radiata; b. 08/2017 MRI/A: Acute 74m23materal L thalamic infarct. Other more remote lacunar infarcts of thalami bilat. Small vessel dzs; c. 08/2018 MRI/A: Acute lacunar infarct of the post limb of R internal capsule; d. 08/2019 MRI Acute CVA of L paramedian pons adn R cerebellar hemisphere.  . Tobacco abuse    Past Surgical History:  Procedure Laterality Date  . ESOPHAGOGASTRODUODENOSCOPY (EGD) WITH PROPOFOL N/A 04/26/2019   Procedure: ESOPHAGOGASTRODUODENOSCOPY (EGD) WITH PROPOFOL;  Surgeon: VanLin LandsmanD;  [place];  Laterality: N/A;    Current Meds  Medication Sig  . baclofen (LIORESAL) 10 MG tablet TAKE ONE TABLET BY MOUTH THREE TIMES A DAY  . Blood Glucose Monitoring Suppl (FIFTY50 GLUCOSE METER 2.0) w/Device KIT Use as instructed  . Blood Glucose Monitoring Suppl KIT Accucheck Guide with lancets and test strips #100 with one refill.  Test blood sugar twice daily. DX Code E11.65 ; Z79.4  . gabapentin (NEURONTIN) 300 MG capsule Take 2 capsules (600 mg total) by mouth 2 (two) times daily.  . gMarland KitchenipiZIDE (GLUCOTROL) 5 MG tablet TAKE 1 TABLET BY MOUTH DAILY BEFORE BREAKFAST  . Insulin Pen Needle (NOVOFINE) 32G X 6 MM MISC 1 each by Does not apply route once a week.  . LMarland KitchenVEMIR FLEXTOUCH 100 UNIT/ML FlexPen Inject into the skin.  . Misc. Devices MISC One pair of Compression stockings  Hemiplegia and hemiparesis following cerebral infarction affecting left non-dominant side (HCC) - Primary Codes: I69L89.211wer extremity edema  Codes: R60.0 Type 2  diabetes mellitus with hyperglycemia, with long-term current use of insulin (HCC)  Codes: E11.65, Z79.4  . pantoprazole (PROTONIX) 40 MG tablet TAKE 1 TABLET BY MOUTH DAILY BEFORE BREAKFAST  . spironolactone (ALDACTONE) 25 MG tablet Take 1 tablet (25 mg total) by mouth daily.  . [DISCONTINUED] amLODipine (NORVASC) 10 MG tablet Take 1 tablet (10 mg total)  by mouth daily.  . [DISCONTINUED] atorvastatin (LIPITOR) 40 MG tablet Take by mouth.  . [DISCONTINUED] ELIQUIS 5 MG TABS tablet TAKE 1 TABLET BY MOUTH 2 TIMES DAILY  . [DISCONTINUED] hydrochlorothiazide (HYDRODIURIL) 25 MG tablet Take 1 tablet (25 mg total) by mouth daily.  . [DISCONTINUED] lisinopril (ZESTRIL) 40 MG tablet Take 1 tablet (40 mg total) by mouth daily.    Allergies: Patient has no known allergies.  Social History   Tobacco Use  . Smoking status: Former Smoker    Packs/day: 1.00    Years: 38.00    Pack years: 38.00    Types: Cigarettes    Start date: 1982  . Smokeless tobacco: Never Used  Vaping Use  . Vaping Use: Never used  Substance Use Topics  . Alcohol use: Not Currently    Alcohol/week: 1.0 standard drink    Types: 1 Cans of beer per week    Comment: previously drank but nothing in 1-2 yrs (08/2019).  . Drug use: Not Currently    Types: Cocaine, Marijuana    Comment: prev used cocaine/marijuana but none x 1-2 yrs (08/2019).    Family History  Problem Relation Age of Onset  . Hypertension Mother   . Stroke Mother        died @ age 29  . Hypertension Father   . Diabetes Father   . Heart attack Father        died @ 52    Review of Systems: A 12-system review of systems was performed and was negative except as noted in the HPI.  --------------------------------------------------------------------------------------------------  Physical Exam: BP (!) 160/90   Pulse 68   Ht 5' 6"  (1.676 m)   Wt 165 lb (74.8 kg)   BMI 26.63 kg/m   General:  NAD. Neck: No JVD or HJR. Lungs: CTA bilaterally. Heart: RRR w/o m/r/g. Abd: Soft, NT/ND.  EKG:  NSR with RBBB and possible inferior infarct.  No significant change from prior tracing on 02/09/2020.  Lab Results  Component Value Date   WBC 5.0 08/06/2020   HGB 11.8 (L) 08/06/2020   HCT 35.8 (L) 08/06/2020   MCV 88.2 08/06/2020   PLT 254 08/06/2020    Lab Results  Component Value Date   NA 140  08/06/2020   K 3.5 08/06/2020   CL 106 08/06/2020   CO2 25 08/06/2020   BUN 16 08/06/2020   CREATININE 0.96 08/06/2020   GLUCOSE 139 (H) 08/06/2020   ALT 41 08/05/2020    Lab Results  Component Value Date   CHOL 139 02/13/2020   HDL 39 (L) 02/13/2020   LDLCALC 65 02/13/2020   TRIG 267 (H) 02/13/2020   CHOLHDL 3.6 02/13/2020    --------------------------------------------------------------------------------------------------  ASSESSMENT AND PLAN: Uncontrolled hypertension: Blood pressure remains poorly controlled, though better than at the time of recent presentation in the ED for hypertensive urgency.  Evan Townsend reports being compliant with his medications.  I have recommended addition of spironolactone 25 mg daily, with follow-up BMP in 1 week.  Sodium restriction was encouraged.  If blood pressure remains elevated, secondary hypertension workup will need to be pursued.  I think it  is reasonable for Evan Townsend restart physical therapy, as his blood pressure is not dangerously high at this point and will hopefully improve with addition of spironolactone.  Paroxysmal atrial fibrillation: No symptoms to suggest recurrence.  We will continue indefinite anticoagulation in the setting of recurrent strokes.  Abnormal EKG: EKG again shows RBBB with possible inferior MI.  Given absence of ischemic symptoms and nonischemic Myoview in 1/20201, we will defer additional testing and intervention.  Follow-up: RTC in 1 month to reassess blood pressure.  Nelva Bush, MD 08/16/2020 3:03 PM

## 2020-08-16 ENCOUNTER — Encounter: Payer: Self-pay | Admitting: Internal Medicine

## 2020-08-16 DIAGNOSIS — R9431 Abnormal electrocardiogram [ECG] [EKG]: Secondary | ICD-10-CM | POA: Insufficient documentation

## 2020-08-16 NOTE — Progress Notes (Deleted)
Patient is a 64 y.o. male Last visit with me was 06/28/20 F/u today after hospitalization 12/20  for HTN'ive urgency He has been unable to participate in PT since then out of concerns for high blood pressure.  Has a history of multiple strokes with left hemiparesis, hypertension, hyperlipidemia, uncontrolled diabetes mellitus, and tobacco use, paroxysmal atrial fibrillation complicated by multiple strokes.    He followed up with cardiology 12/30 /21 and sprinololactone 25 mg daily started for his BP. In June he was started on apixaban and has been doing well without any new neurologic changes since.   His only complaint is of left-sided swelling in the setting of hemiparesis.  He has not had any chest pain, shortness of breath, palpitations, lightheadedness, or edema.  Evan Townsend was [facility] due to hypertensive urgency.   NAD, masked,remains more attentive and interactive,seated in a wheelchair.  Started the visit without his wife present, and he responded appropriately to questions asked, a little difficult to understand at times. HEENT - [place]/AT, sclera anicteric,PERRL, EOMI without nystagmus, conjunctiva noninjected, external auditory canals with minimal cerumen noted bilaterally, not impacted. Neck - supple, no adenopathy,carotids 2+ and equal bilaterally without bruits Car - RRR without m/g/r Pulm- RR and effort normal at rest, CTA without wheeze or rales Abd - soft, NTdiffuselywithno focal epigastric discomfort, Ext -no right lower extremity edema,trace morediffuse left lower extremity edema persists with no focal calf tenderness, no increased erythema or increased warmth of the calf area.   Neuro/psychiatricresponded appropriately to questions asked, Alert with speech at times more difficult to understand Mild left-sided weakness without significant change. In a wheelchair in the office.Has a cane to use when  ambulates for support  1. Essential hypertension Was started on spironolactone very recent past by cardiology   Keep f/u with cardiology as planned  2. Recurrent strokes (HCC) 3. Paroxysmal atrial fibrillation (HCC) Continue with physical therapy Anticoagulated - now on Eliquis with cardiology input Continuing to monitor.  4. Hemiplegia and hemiparesis following cerebral infarction affecting left non-dominant side (HCC) Status post prior CVA. Has returned to physical therapy recently, and continues. He does think the physical therapy has been very helpful.  5. Lower extremity edema Stable in recent past, and likely a prior CVA and limited mobility after contributing with some venous stasis concerns noted.  No concerns for DVT. Continue with physical therapy presently. Also compression stockings recommended previously to help.  Keep f/u end of January as scheduled.

## 2020-08-19 ENCOUNTER — Inpatient Hospital Stay: Payer: Medicaid Other | Admitting: Internal Medicine

## 2020-08-19 DIAGNOSIS — I69354 Hemiplegia and hemiparesis following cerebral infarction affecting left non-dominant side: Secondary | ICD-10-CM

## 2020-08-19 DIAGNOSIS — Z09 Encounter for follow-up examination after completed treatment for conditions other than malignant neoplasm: Secondary | ICD-10-CM

## 2020-08-19 DIAGNOSIS — I480 Paroxysmal atrial fibrillation: Secondary | ICD-10-CM

## 2020-08-19 DIAGNOSIS — I639 Cerebral infarction, unspecified: Secondary | ICD-10-CM

## 2020-08-19 DIAGNOSIS — I10 Essential (primary) hypertension: Secondary | ICD-10-CM

## 2020-08-19 DIAGNOSIS — R600 Localized edema: Secondary | ICD-10-CM

## 2020-08-23 NOTE — Progress Notes (Signed)
Patient ID: Evan Townsend Sr., male    DOB: Aug 23, 1955, 65 y.o.   MRN: [id number]  PCP: Towanda Malkin, MD  Chief Complaint  Patient presents with  . Hospitalization Follow-up    Subjective:   Evan Evan Sr. is a 65 y.o. male, presents to clinic with CC of the following:  Chief Complaint  Patient presents with  . Hospitalization Follow-up    HPI:  Patient is a 65 y.o. male Last visit with me was 06/28/20 F/u today after hospitalization 12/20  for HTN'ive urgency Here with his wife today He has been unable to participate in PT since then out of concerns for high blood pressure, and was told he needs a referral again for me to get back into physical therapy.  He is anxious to get back into physical therapy as that has been helpful.  Doing well since left hospital, not yet back in PT as noted above  BP's at home now well controlled, about 150/ 80 on average. No CP, palp's, SOB, no increased lower extremity swelling  Has a history of multiple strokes with left hemiparesis, hypertension, hyperlipidemia, uncontrolled diabetes mellitus, and tobacco use, paroxysmal atrial fibrillation complicated by multiple strokes.   He followed up with cardiology 12/30 /21 and sprinololactone 25 mg daily started for his BP. In June he was started on apixaban and has been doing well without any new neurologic changes since.  He continues to have some left-sided swelling in the setting of hemiparesis, noted mostly in the distal upper extremity.   He is now taking 35 units of his insulin daily. After our last visit in November, it was recoimmended to increase the insulin dose to 30 Units and follow with checking blood sugars at home and guidance was given and could slowly increase pending these results. Sugars running in the 200 range, in 100's at times, mostly in the 200's.  Not always fasting readings as he is in bed when the wife goes to work often. Increased urination  noted, not increased thirst.   He is in need of a refill for his insulin.   Patient Active Problem List   Diagnosis Date Noted  . Abnormal EKG 08/16/2020  . Hypertensive urgency 08/05/2020  . CKD (chronic kidney disease) stage 2, GFR 60-89 ml/min 06/27/2020  . Monitoring for anticoagulant use 06/27/2020  . Lower extremity edema 05/16/2020  . Paroxysmal atrial fibrillation ([place]) 10/26/2019  . Chest pain of uncertain etiology 44/10/4740  . History of CVA (cerebrovascular accident) 09/05/2019  . Hyperglycemia 09/05/2019  . Anemia 09/05/2019  . AKI (acute kidney injury) ([place]) 09/05/2019  . Atypical pneumonia 09/05/2019  . Coagulopathy ([place]) 06/12/2019  . Gastroesophageal reflux disease   . Hemiplegia and hemiparesis following cerebral infarction affecting left non-dominant side ([place]) 11/25/2018  . 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 RBBB 10/15/2018  . Sinus bradycardia 10/15/2018  . Dysphagia, post-stroke   . Neuropathic pain   . Lacunar infarct, acute ([place]) 09/06/2018  . TIA (transient ischemic attack) 09/01/2018  . Microalbuminuria 07/29/2018  . Left leg pain 01/13/2018  . Hyperlipidemia LDL goal <70 02/11/2017  . Recurrent strokes ([place]) 11/10/2016  . Essential hypertension 11/10/2016  . Type 2 diabetes mellitus with hypoglycemia without coma ([place]) 11/10/2016  . Tobacco abuse 08/28/2013      Current Outpatient Medications:  .  ACCU-CHEK GUIDE test strip, , Disp: , Rfl:  .  Accu-Chek Softclix Lancets lancets, SMARTSIG:Topical, Disp: , Rfl:  .  amLODipine (NORVASC) 10 MG  tablet, Take 1 tablet (10 mg total) by mouth daily., Disp: 90 tablet, Rfl: 0 .  apixaban (ELIQUIS) 5 MG TABS tablet, Take 1 tablet (5 mg total) by mouth 2 (two) times daily., Disp: 180 tablet, Rfl: 0 .  atorvastatin (LIPITOR) 40 MG tablet, Take 1 tablet (40 mg total) by mouth daily., Disp: 90 tablet, Rfl: 0 .  baclofen (LIORESAL) 10 MG tablet, TAKE ONE TABLET BY MOUTH THREE TIMES A DAY, Disp: 90 each, Rfl: 2 .   Blood Glucose Monitoring Suppl (FIFTY50 GLUCOSE METER 2.0) w/Device KIT, Use as instructed, Disp: , Rfl:  .  Blood Glucose Monitoring Suppl KIT, Accucheck Guide with lancets and test strips #100 with one refill.  Test blood sugar twice daily. DX Code E11.65 ; Z79.4, Disp: 1 kit, Rfl: 0 .  gabapentin (NEURONTIN) 300 MG capsule, Take 2 capsules (600 mg total) by mouth 2 (two) times daily., Disp: 360 capsule, Rfl: 1 .  glipiZIDE (GLUCOTROL) 5 MG tablet, TAKE 1 TABLET BY MOUTH DAILY BEFORE BREAKFAST, Disp: 90 tablet, Rfl: 1 .  hydrochlorothiazide (HYDRODIURIL) 25 MG tablet, Take 1 tablet (25 mg total) by mouth daily., Disp: 90 tablet, Rfl: 0 .  Insulin Pen Needle (NOVOFINE) 32G X 6 MM MISC, 1 each by Does not apply route once a week., Disp: 100 each, Rfl: 1 .  LEVEMIR FLEXTOUCH 100 UNIT/ML FlexPen, Inject into the skin., Disp: , Rfl:  .  lisinopril (ZESTRIL) 40 MG tablet, Take 1 tablet (40 mg total) by mouth daily., Disp: 90 tablet, Rfl: 3 .  Misc. Devices MISC, One pair of Compression stockings  Hemiplegia and hemiparesis following cerebral infarction affecting left non-dominant side (HCC) - Primary Codes: O17.510 Lower extremity edema  Codes: R60.0 Type 2 diabetes mellitus with hyperglycemia, with long-term current use of insulin (HCC)  Codes: E11.65, Z79.4, Disp: 1 Units, Rfl: 0 .  pantoprazole (PROTONIX) 40 MG tablet, TAKE 1 TABLET BY MOUTH DAILY BEFORE BREAKFAST, Disp: 30 tablet, Rfl: 3 .  spironolactone (ALDACTONE) 25 MG tablet, Take 1 tablet (25 mg total) by mouth daily., Disp: 30 tablet, Rfl: 1 .  omeprazole (PRILOSEC) 40 MG capsule, TAKE 1 CAPSULE BY MOUTH DAILY BEFORE BREAKFAST, Disp: 30 capsule, Rfl: 0   No Known Allergies   Past Surgical History:  Procedure Laterality Date  . ESOPHAGOGASTRODUODENOSCOPY (EGD) WITH PROPOFOL N/A 04/26/2019   Procedure: ESOPHAGOGASTRODUODENOSCOPY (EGD) WITH PROPOFOL;  Surgeon: Lin Landsman, MD;  Location: [place];  Service: Gastroenterology;   Laterality: N/A;     Family History  Problem Relation Age of Onset  . Hypertension Mother   . Stroke Mother        died @ age 40  . Hypertension Father   . Diabetes Father   . Heart attack Father        died @ 17     Social History   Tobacco Use  . Smoking status: Former Smoker    Packs/day: 1.00    Years: 38.00    Pack years: 38.00    Types: Cigarettes    Start date: 1982  . Smokeless tobacco: Never Used  Substance Use Topics  . Alcohol use: Not Currently    Alcohol/week: 1.0 standard drink    Types: 1 Cans of beer per week    Comment: previously drank but nothing in 1-2 yrs (08/2019).    With staff assistance, above reviewed with the patient/caregiver today.  ROS: As per HPI, otherwise no specific complaints on a limited and focused system review   No  results found for this or any previous visit (from the past 54 hour(s)).   PHQ2/9: Depression screen [facility] 2/9 08/27/2020 06/27/2020 05/16/2020 03/08/2020 02/13/2020  Decreased Interest 0 0 0 0 1  Down, Depressed, Hopeless 0 0 0 0 1  PHQ - 2 Score 0 0 0 0 2  Altered sleeping - - - 1 1  Tired, decreased energy - - - 0 0  Change in appetite - - - 0 0  Feeling bad or failure about yourself  - - - 0 0  Trouble concentrating - - - 0 0  Moving slowly or fidgety/restless - - - 0 0  Suicidal thoughts - - - 0 0  PHQ-9 Score - - - 1 3  Difficult doing work/chores - - - Somewhat difficult Somewhat difficult  Some recent data might be hidden   PHQ-2/9 Result is   Fall Risk: Fall Risk  08/27/2020 06/27/2020 05/16/2020 03/08/2020 02/13/2020  Falls in the past year? 0 1 1 0 0  Number falls in past yr: 0 1 1 0 0  Injury with Fall? 0 0 1 0 0  Follow up - - Falls evaluation completed - Falls evaluation completed      Objective:   Vitals:   08/27/20 1043  BP: 140/82  Pulse: 95  Resp: 16  Temp: 98.4 F (36.9 C)  TempSrc: Oral  SpO2: 93%  Weight: 165 lb (74.8 kg)  Height: _0  (1.676 m)    Body mass index is 26.63  kg/m.  Physical Exam  NAD, masked,remains more attentive and interactive,seated in a wheelchair. HEENT - [place]/AT, sclera anicteric,PERRL, EOMI without nystagmus, conjunctiva noninjected,pharynx clear Neck - supple, no adenopathy,carotids 2+ and equal bilaterally without bruits Car - RRR without m/g/r Pulm- RR and effort normal at rest, CTA without wheeze or rales Abd - soft, NTdiffusely Ext -no right lower extremity edema,trace morediffuse left lower extremity edemapersists Neuro/psychiatricresponded appropriately to questions asked, Alertwith speech at times more difficult to understand Mild left-sided weaknesswithout significant change. In a wheelchair in the office.  Results for orders placed or performed during the hospital encounter of 08/05/20  Resp Panel by RT-PCR (Flu A&B, Covid) Nasopharyngeal Swab   Specimen: Nasopharyngeal Swab; Nasopharyngeal(NP) swabs in vial transport medium  Result Value Ref Range   SARS Coronavirus 2 by RT PCR NEGATIVE NEGATIVE   Influenza A by PCR NEGATIVE NEGATIVE   Influenza B by PCR NEGATIVE NEGATIVE  CBC  Result Value Ref Range   WBC 4.9 4.0 - 10.5 K/uL   RBC 4.16 (L) 4.22 - 5.81 MIL/uL   Hemoglobin 12.6 (L) 13.0 - 17.0 g/dL   HCT 37.1 (L) 39.0 - 52.0 %   MCV 89.2 80.0 - 100.0 fL   MCH 30.3 26.0 - 34.0 pg   MCHC 34.0 30.0 - 36.0 g/dL   RDW 14.9 11.5 - 15.5 %   Platelets 262 150 - 400 K/uL   nRBC 0.0 0.0 - 0.2 %  Comprehensive metabolic panel  Result Value Ref Range   Sodium 141 135 - 145 mmol/L   Potassium 3.6 3.5 - 5.1 mmol/L   Chloride 106 98 - 111 mmol/L   CO2 25 22 - 32 mmol/L   Glucose, Bld 62 (L) 70 - 99 mg/dL   BUN 17 8 - 23 mg/dL   Creatinine, Ser 0.94 0.61 - 1.24 mg/dL   Calcium 9.3 8.9 - 10.3 mg/dL   Total Protein 7.8 6.5 - 8.1 g/dL   Albumin 3.8 3.5 - 5.0 g/dL  AST 26 15 - 41 U/L   ALT 41 0 - 44 U/L   Alkaline Phosphatase 153 (H) 38 - 126 U/L   Total Bilirubin 0.5 0.3  - 1.2 mg/dL   GFR, Estimated >60 >60 mL/min   Anion gap 10 5 - 15  Basic metabolic panel  Result Value Ref Range   Sodium 140 135 - 145 mmol/L   Potassium 3.5 3.5 - 5.1 mmol/L   Chloride 106 98 - 111 mmol/L   CO2 25 22 - 32 mmol/L   Glucose, Bld 139 (H) 70 - 99 mg/dL   BUN 16 8 - 23 mg/dL   Creatinine, Ser 0.96 0.61 - 1.24 mg/dL   Calcium 9.4 8.9 - 10.3 mg/dL   GFR, Estimated >60 >60 mL/min   Anion gap 9 5 - 15  CBC  Result Value Ref Range   WBC 5.0 4.0 - 10.5 K/uL   RBC 4.06 (L) 4.22 - 5.81 MIL/uL   Hemoglobin 11.8 (L) 13.0 - 17.0 g/dL   HCT 35.8 (L) 39.0 - 52.0 %   MCV 88.2 80.0 - 100.0 fL   MCH 29.1 26.0 - 34.0 pg   MCHC 33.0 30.0 - 36.0 g/dL   RDW 14.8 11.5 - 15.5 %   Platelets 254 150 - 400 K/uL   nRBC 0.0 0.0 - 0.2 %  Glucose, capillary  Result Value Ref Range   Glucose-Capillary 117 (H) 70 - 99 mg/dL   Comment 1 Notify RN    Comment 2 Document in Chart   CBG monitoring, ED  Result Value Ref Range   Glucose-Capillary 76 70 - 99 mg/dL  CBG monitoring, ED  Result Value Ref Range   Glucose-Capillary 80 70 - 99 mg/dL  Troponin I (High Sensitivity)  Result Value Ref Range   Troponin I (High Sensitivity) 12 <18 ng/L  Troponin I (High Sensitivity)  Result Value Ref Range   Troponin I (High Sensitivity) 9 <18 ng/L       Assessment & Plan:    1. Essential hypertension Was started on spironolactone very recent past by cardiology, with blood pressure checks relatively controlled at home presently.  Borderline higher systolic murmur, although do not feel pushing to lower that number is appropriate presently Keep f/u with cardiology as planned  2. Recurrent strokes (HCC) 3. Paroxysmal atrial fibrillation ([place]) Continue with physical therapy and another referral placed today to help get him back into physical therapy.  That has been helpful for him. Anticoagulated-now on Eliquis with cardiology input Continuing to monitor.  4. Hemiplegia and hemiparesis  following cerebral infarction affecting left non-dominant side (HCC) Status post prior CVA. He does think the physical therapy has been very helpful, and referral placed again today to get him back into physical therapy  5. Lower extremity edema Stable in recent past, and likely a prior CVA and limited mobility after contributing with some venous stasis concerns noted. No concerns for DVT. Continue with physical therapy presently. Also compression stockings recommended previously to help.  6. Diabetes Mellitus with hyperglycemia with long standing use of insulin His blood sugars have been a little high on home checks, in the 200s, although many not fasting, and unclear how regularly the numbers are fasting. Currently, she states she has been getting 35 units of insulin, although occasionally she does give him a little higher dose when his numbers are higher. Wanted to check an A1c today again, although it has not been 4 months, and concern for a bill for that was  noted. Felt best to have her check fasting blood sugars at home routinely, and if they are running in the 200s or higher over several days, she should increase the insulin dose to 38 units from 35 units daily. Refilled the insulin for him today as well. Continue to monitor his blood sugars at home to help is important.  7. Encounter for examination following treatment at hospital  Reviewed labs done with recent hospital admission. F/u again in 4-6  weeks with new provider, and will be due for f/u labs again at that visit, including an A1c as part of that. F?u sooner prn They are aware that the follow-up will be with a new provider as I will be leaving this practice prior to that next planned follow-up.     Towanda Malkin, MD 08/27/20 10:58 AM

## 2020-08-26 ENCOUNTER — Ambulatory Visit (INDEPENDENT_AMBULATORY_CARE_PROVIDER_SITE_OTHER): Payer: Medicaid Other | Admitting: Podiatry

## 2020-08-26 ENCOUNTER — Other Ambulatory Visit: Payer: Self-pay

## 2020-08-26 ENCOUNTER — Ambulatory Visit: Payer: Medicaid Other | Admitting: Gastroenterology

## 2020-08-26 ENCOUNTER — Encounter: Payer: Self-pay | Admitting: Podiatry

## 2020-08-26 ENCOUNTER — Other Ambulatory Visit: Payer: Self-pay | Admitting: Internal Medicine

## 2020-08-26 DIAGNOSIS — M79674 Pain in right toe(s): Secondary | ICD-10-CM

## 2020-08-26 DIAGNOSIS — M79675 Pain in left toe(s): Secondary | ICD-10-CM | POA: Diagnosis not present

## 2020-08-26 DIAGNOSIS — D689 Coagulation defect, unspecified: Secondary | ICD-10-CM

## 2020-08-26 DIAGNOSIS — B351 Tinea unguium: Secondary | ICD-10-CM

## 2020-08-26 DIAGNOSIS — E1142 Type 2 diabetes mellitus with diabetic polyneuropathy: Secondary | ICD-10-CM

## 2020-08-26 NOTE — Progress Notes (Signed)
This patient returns to my office for at risk foot care.  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 Patient is taking eliquis.  He is accompanied by his wife..This patient is unable to cut nails himself since the patient cannot reach his nails.These nails are painful walking and wearing shoes.  This patient presents for at risk foot care today.  General Appearance  Alert, conversant and in no acute stress.  Vascular  Dorsalis pedis and posterior tibial  pulses are palpable  bilaterally.  Capillary return is within normal limits  bilaterally. Temperature is within normal limits  bilaterally.  Neurologic  Senn-Weinstein monofilament wire test within normal limits  bilaterally. Muscle power within normal limits bilaterally.  Nails Thick disfigured discolored nails with subungual debris  from hallux to fifth toes bilaterally. No evidence of bacterial infection or drainage bilaterally.  Orthopedic  No limitations of motion  feet .  No crepitus or effusions noted.  No bony pathology or digital deformities noted.  Skin  normotropic skin with no porokeratosis noted bilaterally.  No signs of infections or ulcers noted.     Onychomycosis  Pain in right toes  Pain in left toes  Consent was obtained for treatment procedures.   Mechanical debridement of nails 1-5  bilaterally performed with a nail nipper.  Filed with dremel without incident. No infection or ulcer.     Return office visit   12 weeks                  Told patient to return for periodic foot care and evaluation due to potential at risk complications.   Helane Gunther DPM

## 2020-08-27 ENCOUNTER — Ambulatory Visit: Payer: Medicaid Other | Admitting: Internal Medicine

## 2020-08-27 ENCOUNTER — Encounter: Payer: Self-pay | Admitting: Internal Medicine

## 2020-08-27 VITALS — BP 140/82 | HR 95 | Temp 98.4°F | Resp 16 | Ht 66.0 in | Wt 165.0 lb

## 2020-08-27 DIAGNOSIS — E1165 Type 2 diabetes mellitus with hyperglycemia: Secondary | ICD-10-CM | POA: Diagnosis not present

## 2020-08-27 DIAGNOSIS — R600 Localized edema: Secondary | ICD-10-CM

## 2020-08-27 DIAGNOSIS — Z794 Long term (current) use of insulin: Secondary | ICD-10-CM

## 2020-08-27 DIAGNOSIS — Z09 Encounter for follow-up examination after completed treatment for conditions other than malignant neoplasm: Secondary | ICD-10-CM

## 2020-08-27 DIAGNOSIS — I639 Cerebral infarction, unspecified: Secondary | ICD-10-CM

## 2020-08-27 DIAGNOSIS — I480 Paroxysmal atrial fibrillation: Secondary | ICD-10-CM | POA: Diagnosis not present

## 2020-08-27 DIAGNOSIS — I69354 Hemiplegia and hemiparesis following cerebral infarction affecting left non-dominant side: Secondary | ICD-10-CM

## 2020-08-27 DIAGNOSIS — I10 Essential (primary) hypertension: Secondary | ICD-10-CM

## 2020-08-27 NOTE — Patient Instructions (Signed)
Please check fasting blood sugars at home and if above 200 for several days, increase insulin dose to 38 Units daily.

## 2020-09-05 ENCOUNTER — Ambulatory Visit: Payer: Medicaid Other | Admitting: Podiatry

## 2020-09-10 ENCOUNTER — Ambulatory Visit: Payer: Medicaid Other | Attending: Internal Medicine | Admitting: Physical Therapy

## 2020-09-11 ENCOUNTER — Telehealth: Payer: Self-pay

## 2020-09-11 NOTE — Telephone Encounter (Signed)
Called and left a message about his blood sugar. Told them to call back if they have anymore concerns.

## 2020-09-11 NOTE — Progress Notes (Deleted)
Cardiology Office Note    Date:  09/11/2020   ID:  Evan MONTESINOS Sr., DOB 1955-08-25, MRN [id number]  PCP:  Jamelle Haring, MD  Cardiologist:  [name], MD  Electrophysiologist:  None   Chief Complaint: Follow up  History of Present Illness:   Evan WHIRLEY Sr. is a 65 y.o. male with history of recurrent strokes, left hemiparesis, PAF diagnosed in 10/2019 on Eliquis, diastolic dysfunction, HTN, HLD, poorly controlled diabetes, cerebrovascular and carotid disease, and tobacco use who presents for follow up of HTN.   Echo in 2018, during admission for CVA, showed an EF Of 55-65% with mild LVH. Carotid artery ultrasound in 08/2017 showed less than 50% bilateral ICA stenoses. During admission for TIA in 08/2018, echo showed an EF of 65%, mild concentric LVH, no RWMA, Gr1DD, and mild MR. Carotid ultrasound in 08/2018 showed minimal to moderate RICA atherosclerotic plaque not resulting in hemodynamically significant stenosis with normal LICA.   He was admitted to the hospital in 08/2019 with hyperglycemia. Echo showed an EF of 55-60%, borderline LVH, Gr1DD, no RWMA, low normal RVSF with normal ventricular cavity size, degenerative mitral valve with mild regurgitation, and a mildly elevated PASP at 37.6 mmHg. Stress testing was nonischemic. One day after discharge, he presented to [facility] ED with dysarthria, left facial droop, and worsening left-sided weakness. MRI showed acute stroke in the left paramedian pons and right cerebellar hemisphere. Subsequent outpatient cardiac monitoring showed PAF, as outlined below, leading him to be placed on Eliquis. He was most recently admitted to the hospital in 07/2020 with hypertensive urgency. He was last seen in the office on 08/15/2020 for follow up and was doing well. His BP remained elevated at 160/90. He reported adherence to his medications. He was started on spironolactone 25 mg with continuation of prior antihypertensives.   ***   Labs  independently reviewed: 07/2020 - HGB 11.8, PLT 254, potassium 3.5, BUN 16, SCr 0.96, albumin 3.8, AST/ALT normal 06/2020 - A1c 10.9 01/2020 - TC 139, TG 267, HDL 39, LDL 65 09/2019 - TSH normal  Past Medical History:  Diagnosis Date  . Carotid arterial disease (HCC)    a. 08/2018 Carotid U/S: min-mod RICA atherosclerosis w/o hemodynamically significant stenosis. Nl LICA.  Marland Kitchen Chest pain    a. 08/2019 MV: EF 55%. No ischemia. Diaph attenuation noted.  . Diabetes 1.5, managed as type 2 (HCC)   . Diastolic dysfunction    a. 08/2018 Echo: EF 65%. No rwma. Gr1 DD. Mild MR.  . Diastolic dysfunction    a. 08/2019 Echo: EF 55-60%, Gr1 DD. No rwma. Mild MR. RVSP 37.46mmHg.  Marland Kitchen Hypercholesterolemia   . Hypertension   . Left hemiparesis (HCC)    a. Ambulated w/ Cane. Limited use of LUE.  Marland Kitchen PAF (paroxysmal atrial fibrillation) (HCC)    a. 10/2019 Event Monitor: PAF; b. CHA2DS2VASc = 5-->Eliquis.  . Pain in both feet   . Poorly controlled diabetes mellitus (HCC)    a. 04/2019 A1c 13.8.  Marland Kitchen Recurrent strokes (HCC)    a. 10/2016 MRI/A: Acute 29mm R thalamic infarct, ? subacute infarct of R corona radiata; b. 08/2017 MRI/A: Acute 67mm lateral L thalamic infarct. Other more remote lacunar infarcts of thalami bilat. Small vessel dzs; c. 08/2018 MRI/A: Acute lacunar infarct of the post limb of R internal capsule; d. 08/2019 MRI Acute CVA of L paramedian pons adn R cerebellar hemisphere.  . Tobacco abuse     Past Surgical History:  Procedure Laterality Date  .  ESOPHAGOGASTRODUODENOSCOPY (EGD) WITH PROPOFOL N/A 04/26/2019   Procedure: ESOPHAGOGASTRODUODENOSCOPY (EGD) WITH PROPOFOL;  Surgeon: Toney Reil, MD;  Location: [facility] ENDOSCOPY;  Service: Gastroenterology;  Laterality: N/A;    Current Medications: No outpatient medications have been marked as taking for the 09/17/20 encounter (Appointment) with [name], PA-C.    Allergies:   Patient has no known allergies.   Social History   Socioeconomic  History  . Marital status: Married    Spouse name: Solicitor  . Number of children: 5  . Years of education: Not on file  . Highest education level: Not on file  Occupational History  . Occupation: unemployed  Tobacco Use  . Smoking status: Former Smoker    Packs/day: 1.00    Years: 38.00    Pack years: 38.00    Types: Cigarettes    Start date: 1982  . Smokeless tobacco: Never Used  Vaping Use  . Vaping Use: Never used  Substance and Sexual Activity  . Alcohol use: Not Currently    Alcohol/week: 1.0 standard drink    Types: 1 Cans of beer per week    Comment: previously drank but nothing in 1-2 yrs (08/2019).  . Drug use: Not Currently    Types: Cocaine, Marijuana    Comment: prev used cocaine/marijuana but none x 1-2 yrs (08/2019).  . Sexual activity: Yes  Other Topics Concern  . Not on file  Social History Narrative   Lives in [place] with his wife.  Uses a cane when ambulating.  Can walk about 25 yds prior to having to rest - says he stumbles if he has to work further than that.  Usually uses a scooter to get around stores.   Social Determinants of Health   Financial Resource Strain: Not on file  Food Insecurity: Not on file  Transportation Needs: Not on file  Physical Activity: Not on file  Stress: Not on file  Social Connections: Not on file     Family History:  The patient's family history includes Diabetes in his father; Heart attack in his father; Hypertension in his father and mother; Stroke in his mother.  ROS:   ROS   EKGs/Labs/Other Studies Reviewed:    Studies reviewed were summarized above. The additional studies were reviewed today:  Zio patch 10/2019:  The patient was monitored for 5 days, 15 hours.  The predominant rhythm was sinus with an average rate of 62 bpm (range 38-138 bpm).  There were rare PAC's and PVC's.  No sustained arrhythmia or prolonged pause was identified.  There were no patient triggered events.   Predominantly  sinus rhythm without significant arrhythmia.  Of note, preceding 14 day monitor showed paroxysmal atrial fibrillation. __________  Luci Bank patch 09/2019:  The patient was monitored for 14 days.  The predominant rhythm was sinus with an average rate of 62 bpm (range 38 to 124 bpm in sinus).  Rare PACs and PVCs were noted.  Paroxysmal atrial fibrillation occurred, with the longest episode lasting 1 hours, 7 minutes. Average ventricular rate while in atrial fibrillation was 90 bpm with a range of 51 to 188 bpm. Atrial fibrillation burden was less than 1%.  There was no prolonged pause.  Patient triggered event corresponds to sinus rhythm.   Predominantly sinus rhythm with paroxysmal atrial fibrillation (longest episode lasting 1 hour, 7 minutes; <1% burden).  Rare PACs and PVCs also noted. __________  Eugenie Birks MPI 08/2019:  This is a low risk study.  The calculated left ventricular ejection  fraction is moderately decreased (33%). This is likely a gating artifact as Visual EF appears normal at about 55%. recommend correlation with another imaging modality such as echocardiogram.  There was no evidence for ischemia  Mild diaphragmatic attenuation noted __________  2D echo 08/2019: 1. Left ventricular ejection fraction, by visual estimation, is 55 to  60%. The left ventricle has normal function. There is borderline left  ventricular hypertrophy.  2. Left ventricular diastolic parameters are consistent with Grade I  diastolic dysfunction (impaired relaxation).  3. The left ventricle has no regional wall motion abnormalities.  4. Global right ventricle has low normal systolic function.The right  ventricular size is normal. No increase in right ventricular wall  thickness.  5. Left atrial size was normal.  6. Right atrial size was normal.  7. The mitral valve is degenerative. Mild mitral valve regurgitation. No  evidence of mitral stenosis.  8. The tricuspid valve is grossly  normal.  9. The tricuspid valve is grossly normal. Tricuspid valve regurgitation  is not demonstrated.  10. The aortic valve is tricuspid. Aortic valve regurgitation is not  visualized. No significant stenosis suspected, though evaluation is  limited by lack of spectral Doppler images.  11. The pulmonic valve was not well visualized. Pulmonic valve  regurgitation is not visualized.  12. Mildly elevated pulmonary artery systolic pressure.  13. The tricuspid regurgitant velocity is 2.94 m/s, and with an assumed  right atrial pressure of 3 mmHg, the estimated right ventricular systolic  pressure is mildly elevated at 37.6 mmHg.  14. The inferior vena cava is normal in size with greater than 50%  respiratory variability, suggesting right atrial pressure of 3 mmHg.  15. The interatrial septum was not well visualized. __________  2D echo 08/2018: - Left ventricle: The cavity size was normal. There was mild  concentric hypertrophy. Systolic function was normal. The  estimated ejection fraction was 65%. Wall motion was normal;  there were no regional wall motion abnormalities. Doppler  parameters are consistent with abnormal left ventricular  relaxation (grade 1 diastolic dysfunction).  - Mitral valve: There was mild regurgitation.   Impressions:   - No cardiac source of emboli was indentified. __________  2D echo 10/2016: - Left ventricle: The cavity size was normal. Wall thickness was  increased in a pattern of mild LVH. Systolic function was normal.  The estimated ejection fraction was in the range of 55% to 65%.   EKG:  EKG is ordered today.  The EKG ordered today demonstrates ***  Recent Labs: 08/05/2020: ALT 41 08/06/2020: BUN 16; Creatinine, Ser 0.96; Hemoglobin 11.8; Platelets 254; Potassium 3.5; Sodium 140  Recent Lipid Panel    Component Value Date/Time   CHOL 139 02/13/2020 1417   CHOL 161 02/10/2018 1906   TRIG 267 (H) 02/13/2020 1417   HDL 39 (L)  02/13/2020 1417   HDL 64 02/10/2018 1906   CHOLHDL 3.6 02/13/2020 1417   VLDL 13 09/02/2018 0420   LDLCALC 65 02/13/2020 1417    PHYSICAL EXAM:    VS:  There were no vitals taken for this visit.  BMI: There is no height or weight on file to calculate BMI.  Physical Exam  Wt Readings from Last 3 Encounters:  08/27/20 165 lb (74.8 kg)  08/15/20 165 lb (74.8 kg)  08/05/20 160 lb 4.4 oz (72.7 kg)     ASSESSMENT & PLAN:   1. HTN: Blood pressure ***  2. PAF:  3. Diastolic dysfunction:  4. Abnormal EKG:  5. HLD:  LDL 65 on 01/2020 with normal LFT in 07/2020.  ***  6. Recurrent CVA with left-sided hemiparesis/cerebrovascular and carotid disease:  Disposition: F/u with Dr. Okey Dupre or an APP in ***.   Medication Adjustments/Labs and Tests Ordered: Current medicines are reviewed at length with the patient today.  Concerns regarding medicines are outlined above. Medication changes, Labs and Tests ordered today are summarized above and listed in the Patient Instructions accessible in Encounters.   Signed, Eula Listen, PA-C 09/11/2020 2:17 PM     [facility] [place] [address] [place], [place] [address] [phone]

## 2020-09-11 NOTE — Telephone Encounter (Signed)
Copied from CRM [id number]. Topic: General - Other >> Sep 11, 2020  8:53 AM Gwenlyn Fudge wrote: Reason for CRM: Pts wife called stating that the pts blood sugar is running at 200. She states that he is having no other symptoms related to high blood sugar. Please advise.

## 2020-09-12 ENCOUNTER — Encounter: Payer: Medicaid Other | Admitting: Physical Therapy

## 2020-09-16 ENCOUNTER — Ambulatory Visit: Payer: Medicaid Other | Admitting: Internal Medicine

## 2020-09-17 ENCOUNTER — Ambulatory Visit: Payer: Medicaid Other | Admitting: Physical Therapy

## 2020-09-17 ENCOUNTER — Ambulatory Visit: Payer: Medicaid Other | Admitting: Physician Assistant

## 2020-09-18 ENCOUNTER — Encounter: Payer: Self-pay | Admitting: Physician Assistant

## 2020-09-19 ENCOUNTER — Ambulatory Visit: Payer: Medicaid Other | Attending: Family Medicine | Admitting: Physical Therapy

## 2020-09-19 ENCOUNTER — Other Ambulatory Visit: Payer: Self-pay

## 2020-09-19 DIAGNOSIS — M79602 Pain in left arm: Secondary | ICD-10-CM | POA: Insufficient documentation

## 2020-09-19 DIAGNOSIS — M6281 Muscle weakness (generalized): Secondary | ICD-10-CM | POA: Insufficient documentation

## 2020-09-19 DIAGNOSIS — R262 Difficulty in walking, not elsewhere classified: Secondary | ICD-10-CM | POA: Insufficient documentation

## 2020-09-19 DIAGNOSIS — Z9181 History of falling: Secondary | ICD-10-CM | POA: Insufficient documentation

## 2020-09-19 DIAGNOSIS — R2681 Unsteadiness on feet: Secondary | ICD-10-CM | POA: Diagnosis not present

## 2020-09-19 DIAGNOSIS — I69354 Hemiplegia and hemiparesis following cerebral infarction affecting left non-dominant side: Secondary | ICD-10-CM | POA: Diagnosis not present

## 2020-09-20 ENCOUNTER — Telehealth: Payer: Self-pay

## 2020-09-20 ENCOUNTER — Encounter: Payer: Self-pay | Admitting: Emergency Medicine

## 2020-09-20 ENCOUNTER — Other Ambulatory Visit: Payer: Self-pay

## 2020-09-20 ENCOUNTER — Emergency Department: Payer: Medicaid Other

## 2020-09-20 ENCOUNTER — Observation Stay
Admission: EM | Admit: 2020-09-20 | Discharge: 2020-09-21 | Disposition: A | Discharge status: Home / Self Care | Payer: Medicaid Other | Attending: Obstetrics and Gynecology | Admitting: Obstetrics and Gynecology

## 2020-09-20 DIAGNOSIS — R1011 Right upper quadrant pain: Secondary | ICD-10-CM | POA: Diagnosis not present

## 2020-09-20 DIAGNOSIS — K219 Gastro-esophageal reflux disease without esophagitis: Secondary | ICD-10-CM | POA: Diagnosis not present

## 2020-09-20 DIAGNOSIS — Z79899 Other long term (current) drug therapy: Secondary | ICD-10-CM | POA: Insufficient documentation

## 2020-09-20 DIAGNOSIS — R1013 Epigastric pain: Secondary | ICD-10-CM | POA: Diagnosis not present

## 2020-09-20 DIAGNOSIS — Z9181 History of falling: Secondary | ICD-10-CM | POA: Diagnosis not present

## 2020-09-20 DIAGNOSIS — E8729 Other acidosis: Secondary | ICD-10-CM

## 2020-09-20 DIAGNOSIS — N182 Chronic kidney disease, stage 2 (mild): Secondary | ICD-10-CM | POA: Diagnosis not present

## 2020-09-20 DIAGNOSIS — Z20822 Contact with and (suspected) exposure to covid-19: Secondary | ICD-10-CM | POA: Insufficient documentation

## 2020-09-20 DIAGNOSIS — E1110 Type 2 diabetes mellitus with ketoacidosis without coma: Secondary | ICD-10-CM | POA: Diagnosis not present

## 2020-09-20 DIAGNOSIS — I7090 Unspecified atherosclerosis: Secondary | ICD-10-CM | POA: Diagnosis not present

## 2020-09-20 DIAGNOSIS — J9811 Atelectasis: Secondary | ICD-10-CM | POA: Diagnosis not present

## 2020-09-20 DIAGNOSIS — R112 Nausea with vomiting, unspecified: Secondary | ICD-10-CM | POA: Insufficient documentation

## 2020-09-20 DIAGNOSIS — Z7984 Long term (current) use of oral hypoglycemic drugs: Secondary | ICD-10-CM | POA: Insufficient documentation

## 2020-09-20 DIAGNOSIS — E860 Dehydration: Secondary | ICD-10-CM | POA: Diagnosis not present

## 2020-09-20 DIAGNOSIS — E1165 Type 2 diabetes mellitus with hyperglycemia: Secondary | ICD-10-CM | POA: Diagnosis not present

## 2020-09-20 DIAGNOSIS — E1122 Type 2 diabetes mellitus with diabetic chronic kidney disease: Secondary | ICD-10-CM | POA: Insufficient documentation

## 2020-09-20 DIAGNOSIS — R0689 Other abnormalities of breathing: Secondary | ICD-10-CM | POA: Diagnosis not present

## 2020-09-20 DIAGNOSIS — I69354 Hemiplegia and hemiparesis following cerebral infarction affecting left non-dominant side: Secondary | ICD-10-CM | POA: Diagnosis not present

## 2020-09-20 DIAGNOSIS — R1010 Upper abdominal pain, unspecified: Secondary | ICD-10-CM

## 2020-09-20 DIAGNOSIS — R739 Hyperglycemia, unspecified: Secondary | ICD-10-CM

## 2020-09-20 DIAGNOSIS — Z794 Long term (current) use of insulin: Secondary | ICD-10-CM | POA: Insufficient documentation

## 2020-09-20 DIAGNOSIS — R531 Weakness: Secondary | ICD-10-CM | POA: Insufficient documentation

## 2020-09-20 DIAGNOSIS — N179 Acute kidney failure, unspecified: Secondary | ICD-10-CM | POA: Diagnosis not present

## 2020-09-20 DIAGNOSIS — R0781 Pleurodynia: Secondary | ICD-10-CM | POA: Diagnosis not present

## 2020-09-20 DIAGNOSIS — M6281 Muscle weakness (generalized): Secondary | ICD-10-CM | POA: Diagnosis not present

## 2020-09-20 DIAGNOSIS — R079 Chest pain, unspecified: Secondary | ICD-10-CM | POA: Diagnosis not present

## 2020-09-20 DIAGNOSIS — I10 Essential (primary) hypertension: Secondary | ICD-10-CM | POA: Diagnosis not present

## 2020-09-20 DIAGNOSIS — R109 Unspecified abdominal pain: Secondary | ICD-10-CM | POA: Diagnosis not present

## 2020-09-20 DIAGNOSIS — E872 Acidosis: Secondary | ICD-10-CM | POA: Diagnosis not present

## 2020-09-20 DIAGNOSIS — R1110 Vomiting, unspecified: Secondary | ICD-10-CM | POA: Diagnosis not present

## 2020-09-20 DIAGNOSIS — Z87891 Personal history of nicotine dependence: Secondary | ICD-10-CM | POA: Insufficient documentation

## 2020-09-20 DIAGNOSIS — R262 Difficulty in walking, not elsewhere classified: Secondary | ICD-10-CM | POA: Diagnosis not present

## 2020-09-20 DIAGNOSIS — R2681 Unsteadiness on feet: Secondary | ICD-10-CM | POA: Diagnosis not present

## 2020-09-20 DIAGNOSIS — M79602 Pain in left arm: Secondary | ICD-10-CM | POA: Diagnosis not present

## 2020-09-20 DIAGNOSIS — Z7901 Long term (current) use of anticoagulants: Secondary | ICD-10-CM | POA: Diagnosis not present

## 2020-09-20 DIAGNOSIS — R0789 Other chest pain: Secondary | ICD-10-CM | POA: Diagnosis not present

## 2020-09-20 DIAGNOSIS — I129 Hypertensive chronic kidney disease with stage 1 through stage 4 chronic kidney disease, or unspecified chronic kidney disease: Secondary | ICD-10-CM | POA: Insufficient documentation

## 2020-09-20 DIAGNOSIS — I480 Paroxysmal atrial fibrillation: Secondary | ICD-10-CM

## 2020-09-20 DIAGNOSIS — Z8639 Personal history of other endocrine, nutritional and metabolic disease: Secondary | ICD-10-CM | POA: Diagnosis present

## 2020-09-20 LAB — CBG MONITORING, ED
Glucose-Capillary: 143 mg/dL — ABNORMAL HIGH (ref 70–99)
Glucose-Capillary: 182 mg/dL — ABNORMAL HIGH (ref 70–99)
Glucose-Capillary: 184 mg/dL — ABNORMAL HIGH (ref 70–99)
Glucose-Capillary: 187 mg/dL — ABNORMAL HIGH (ref 70–99)
Glucose-Capillary: 191 mg/dL — ABNORMAL HIGH (ref 70–99)
Glucose-Capillary: 202 mg/dL — ABNORMAL HIGH (ref 70–99)
Glucose-Capillary: 221 mg/dL — ABNORMAL HIGH (ref 70–99)
Glucose-Capillary: 224 mg/dL — ABNORMAL HIGH (ref 70–99)
Glucose-Capillary: 256 mg/dL — ABNORMAL HIGH (ref 70–99)
Glucose-Capillary: 288 mg/dL — ABNORMAL HIGH (ref 70–99)
Glucose-Capillary: 304 mg/dL — ABNORMAL HIGH (ref 70–99)
Glucose-Capillary: 310 mg/dL — ABNORMAL HIGH (ref 70–99)
Glucose-Capillary: 363 mg/dL — ABNORMAL HIGH (ref 70–99)

## 2020-09-20 LAB — URINE DRUG SCREEN, QUALITATIVE (ARMC ONLY)
Amphetamines, Ur Screen: NOT DETECTED
Barbiturates, Ur Screen: NOT DETECTED
Benzodiazepine, Ur Scrn: NOT DETECTED
Cannabinoid 50 Ng, Ur ~~LOC~~: NOT DETECTED
Cocaine Metabolite,Ur ~~LOC~~: NOT DETECTED
MDMA (Ecstasy)Ur Screen: NOT DETECTED
Methadone Scn, Ur: NOT DETECTED
Opiate, Ur Screen: NOT DETECTED
Phencyclidine (PCP) Ur S: NOT DETECTED
Tricyclic, Ur Screen: NOT DETECTED

## 2020-09-20 LAB — BASIC METABOLIC PANEL
Anion gap: 16 — ABNORMAL HIGH (ref 5–15)
Anion gap: 18 — ABNORMAL HIGH (ref 5–15)
Anion gap: 14 (ref 5–15)
Anion gap: 11 (ref 5–15)
BUN: 40 mg/dL — ABNORMAL HIGH (ref 8–23)
BUN: 37 mg/dL — ABNORMAL HIGH (ref 8–23)
BUN: 35 mg/dL — ABNORMAL HIGH (ref 8–23)
BUN: 30 mg/dL — ABNORMAL HIGH (ref 8–23)
CO2: 18 mmol/L — ABNORMAL LOW (ref 22–32)
CO2: 17 mmol/L — ABNORMAL LOW (ref 22–32)
CO2: 19 mmol/L — ABNORMAL LOW (ref 22–32)
CO2: 22 mmol/L (ref 22–32)
Calcium: 8.9 mg/dL (ref 8.9–10.3)
Calcium: 9.3 mg/dL (ref 8.9–10.3)
Calcium: 9.2 mg/dL (ref 8.9–10.3)
Calcium: 9.4 mg/dL (ref 8.9–10.3)
Chloride: 101 mmol/L (ref 98–111)
Chloride: 106 mmol/L (ref 98–111)
Chloride: 107 mmol/L (ref 98–111)
Chloride: 107 mmol/L (ref 98–111)
Creatinine, Ser: 1.58 mg/dL — ABNORMAL HIGH (ref 0.61–1.24)
Creatinine, Ser: 1.46 mg/dL — ABNORMAL HIGH (ref 0.61–1.24)
Creatinine, Ser: 1.38 mg/dL — ABNORMAL HIGH (ref 0.61–1.24)
Creatinine, Ser: 1.24 mg/dL (ref 0.61–1.24)
GFR, Estimated: 49 mL/min — ABNORMAL LOW (ref >60)
GFR, Estimated: 54 mL/min — ABNORMAL LOW (ref >60)
GFR, Estimated: 57 mL/min — ABNORMAL LOW (ref >60)
GFR, Estimated: >60 mL/min (ref >60)
Glucose, Bld: 324 mg/dL — ABNORMAL HIGH (ref 70–99)
Glucose, Bld: 291 mg/dL — ABNORMAL HIGH (ref 70–99)
Glucose, Bld: 210 mg/dL — ABNORMAL HIGH (ref 70–99)
Glucose, Bld: 203 mg/dL — ABNORMAL HIGH (ref 70–99)
Potassium: 3.5 mmol/L (ref 3.5–5.1)
Potassium: 3.8 mmol/L (ref 3.5–5.1)
Potassium: 3.6 mmol/L (ref 3.5–5.1)
Potassium: 3.4 mmol/L — ABNORMAL LOW (ref 3.5–5.1)
Sodium: 135 mmol/L (ref 135–145)
Sodium: 141 mmol/L (ref 135–145)
Sodium: 140 mmol/L (ref 135–145)
Sodium: 140 mmol/L (ref 135–145)

## 2020-09-20 LAB — BLOOD GAS, VENOUS
Acid-base deficit: 6.5 mmol/L — ABNORMAL HIGH (ref 0.0–2.0)
Bicarbonate: 19.7 mmol/L — ABNORMAL LOW (ref 20.0–28.0)
O2 Saturation: 90.1 %
Patient temperature: 37
pCO2, Ven: 41 mmHg — ABNORMAL LOW (ref 44.0–60.0)
pH, Ven: 7.29 (ref 7.250–7.430)
pO2, Ven: 66 mmHg — ABNORMAL HIGH (ref 32.0–45.0)

## 2020-09-20 LAB — COMPREHENSIVE METABOLIC PANEL
ALT: 57 U/L — ABNORMAL HIGH (ref 0–44)
AST: 35 U/L (ref 15–41)
Albumin: 4.3 g/dL (ref 3.5–5.0)
Alkaline Phosphatase: 156 U/L — ABNORMAL HIGH (ref 38–126)
Anion gap: 16 — ABNORMAL HIGH (ref 5–15)
BUN: 42 mg/dL — ABNORMAL HIGH (ref 8–23)
CO2: 18 mmol/L — ABNORMAL LOW (ref 22–32)
Calcium: 9.4 mg/dL (ref 8.9–10.3)
Chloride: 103 mmol/L (ref 98–111)
Creatinine, Ser: 1.66 mg/dL — ABNORMAL HIGH (ref 0.61–1.24)
GFR, Estimated: 46 mL/min — ABNORMAL LOW (ref >60)
Glucose, Bld: 372 mg/dL — ABNORMAL HIGH (ref 70–99)
Potassium: 3.7 mmol/L (ref 3.5–5.1)
Sodium: 137 mmol/L (ref 135–145)
Total Bilirubin: 0.3 mg/dL (ref 0.3–1.2)
Total Protein: 8.6 g/dL — ABNORMAL HIGH (ref 6.5–8.1)

## 2020-09-20 LAB — URINALYSIS, COMPLETE (UACMP) WITH MICROSCOPIC
Bacteria, UA: NONE SEEN
Bilirubin Urine: NEGATIVE
Glucose, UA: >=500 mg/dL — AB
Ketones, ur: NEGATIVE mg/dL
Leukocytes,Ua: NEGATIVE
Nitrite: NEGATIVE
Protein, ur: 100 mg/dL — AB
Specific Gravity, Urine: 1.018 (ref 1.005–1.030)
Squamous Epithelial / HPF: NONE SEEN (ref 0–5)
pH: 5 (ref 5.0–8.0)

## 2020-09-20 LAB — LIPASE, BLOOD: Lipase: 25 U/L (ref 11–51)

## 2020-09-20 LAB — CBC
HCT: 36.3 % — ABNORMAL LOW (ref 39.0–52.0)
Hemoglobin: 12 g/dL — ABNORMAL LOW (ref 13.0–17.0)
MCH: 29.3 pg (ref 26.0–34.0)
MCHC: 33.1 g/dL (ref 30.0–36.0)
MCV: 88.8 fL (ref 80.0–100.0)
Platelets: 312 10*3/uL (ref 150–400)
RBC: 4.09 MIL/uL — ABNORMAL LOW (ref 4.22–5.81)
RDW: 15.1 % (ref 11.5–15.5)
WBC: 8.8 10*3/uL (ref 4.0–10.5)
nRBC: 0 % (ref 0.0–0.2)

## 2020-09-20 LAB — HEMOGLOBIN A1C
Hgb A1c MFr Bld: 8.4 % — ABNORMAL HIGH (ref 4.8–5.6)
Mean Plasma Glucose: 194.38 mg/dL

## 2020-09-20 LAB — MAGNESIUM: Magnesium: 1.9 mg/dL (ref 1.7–2.4)

## 2020-09-20 LAB — BETA-HYDROXYBUTYRIC ACID
Beta-Hydroxybutyric Acid: 0.18 mmol/L (ref 0.05–0.27)
Beta-Hydroxybutyric Acid: <0.05 mmol/L — ABNORMAL LOW (ref 0.05–0.27)
Beta-Hydroxybutyric Acid: <0.05 mmol/L — ABNORMAL LOW (ref 0.05–0.27)

## 2020-09-20 LAB — TROPONIN I (HIGH SENSITIVITY)
Troponin I (High Sensitivity): 8 ng/L (ref <18)
Troponin I (High Sensitivity): 9 ng/L (ref <18)

## 2020-09-20 MED ORDER — HYDROMORPHONE HCL 1 MG/ML IJ SOLN
1.0000 mg | Freq: Once | INTRAMUSCULAR | Status: AC
  Administered 2020-09-20: 1 mg via INTRAVENOUS
  Filled 2020-09-20: qty 1

## 2020-09-20 MED ORDER — INSULIN ASPART 100 UNIT/ML ~~LOC~~ SOLN
0.0000 [IU] | SUBCUTANEOUS | Status: DC
  Administered 2020-09-20: 15 [IU] via SUBCUTANEOUS
  Administered 2020-09-21: 3 [IU] via SUBCUTANEOUS
  Filled 2020-09-20: qty 1

## 2020-09-20 MED ORDER — ONDANSETRON HCL 4 MG/2ML IJ SOLN
4.0000 mg | Freq: Once | INTRAMUSCULAR | Status: AC
  Administered 2020-09-21: 4 mg via INTRAVENOUS
  Filled 2020-09-20 (×2): qty 2

## 2020-09-20 MED ORDER — ALUM & MAG HYDROXIDE-SIMETH 200-200-20 MG/5ML PO SUSP
30.0000 mL | Freq: Once | ORAL | Status: AC
  Administered 2020-09-20: 30 mL via ORAL
  Filled 2020-09-20: qty 30

## 2020-09-20 MED ORDER — INSULIN ASPART 100 UNIT/ML ~~LOC~~ SOLN: 3.0000 [IU] | Freq: Three times a day (TID) | SUBCUTANEOUS | Status: DC

## 2020-09-20 MED ORDER — INSULIN REGULAR(HUMAN) IN NACL 100-0.9 UT/100ML-% IV SOLN
INTRAVENOUS | Status: DC
  Administered 2020-09-20: 8 [IU]/h via INTRAVENOUS
  Filled 2020-09-20: qty 100

## 2020-09-20 MED ORDER — AMLODIPINE BESYLATE 5 MG PO TABS
10.0000 mg | ORAL_TABLET | Freq: Every day | ORAL | Status: DC
  Administered 2020-09-20: 10 mg via ORAL
  Filled 2020-09-20: qty 2

## 2020-09-20 MED ORDER — PANTOPRAZOLE SODIUM 40 MG IV SOLR
40.0000 mg | Freq: Once | INTRAVENOUS | Status: AC
  Administered 2020-09-20: 40 mg via INTRAVENOUS
  Filled 2020-09-20: qty 40

## 2020-09-20 MED ORDER — METOCLOPRAMIDE HCL 5 MG/ML IJ SOLN
10.0000 mg | Freq: Three times a day (TID) | INTRAMUSCULAR | Status: AC
  Administered 2020-09-20 – 2020-09-21 (×3): 10 mg via INTRAVENOUS
  Filled 2020-09-20 (×3): qty 2

## 2020-09-20 MED ORDER — IOHEXOL 300 MG/ML  SOLN: 100.0000 mL | Freq: Once | INTRAMUSCULAR | Status: DC | PRN

## 2020-09-20 MED ORDER — POTASSIUM CHLORIDE 10 MEQ/100ML IV SOLN
10.0000 meq | INTRAVENOUS | Status: AC
  Administered 2020-09-20 (×2): 10 meq via INTRAVENOUS
  Filled 2020-09-20 (×2): qty 100

## 2020-09-20 MED ORDER — ATORVASTATIN CALCIUM 20 MG PO TABS
40.0000 mg | ORAL_TABLET | Freq: Every day | ORAL | Status: DC
  Administered 2020-09-20: 40 mg via ORAL
  Filled 2020-09-20 (×2): qty 2

## 2020-09-20 MED ORDER — LACTATED RINGERS IV SOLN: INTRAVENOUS | Status: DC

## 2020-09-20 MED ORDER — LACTATED RINGERS IV BOLUS
1000.0000 mL | Freq: Once | INTRAVENOUS | Status: AC
  Administered 2020-09-20: 1000 mL via INTRAVENOUS

## 2020-09-20 MED ORDER — DEXTROSE 50 % IV SOLN: 0.0000 mL | INTRAVENOUS | Status: DC | PRN

## 2020-09-20 MED ORDER — INSULIN ASPART 100 UNIT/ML ~~LOC~~ SOLN: 0.0000 [IU] | Freq: Three times a day (TID) | SUBCUTANEOUS | Status: DC

## 2020-09-20 MED ORDER — INSULIN GLARGINE 100 UNIT/ML ~~LOC~~ SOLN
20.0000 [IU] | SUBCUTANEOUS | Status: DC
  Administered 2020-09-20: 20 [IU] via SUBCUTANEOUS
  Filled 2020-09-20 (×2): qty 0.2

## 2020-09-20 MED ORDER — GABAPENTIN 300 MG PO CAPS
600.0000 mg | ORAL_CAPSULE | Freq: Two times a day (BID) | ORAL | Status: DC
  Administered 2020-09-20 – 2020-09-21 (×2): 600 mg via ORAL
  Filled 2020-09-20 (×3): qty 2

## 2020-09-20 MED ORDER — LACTATED RINGERS IV BOLUS
20.0000 mL/kg | Freq: Once | INTRAVENOUS | Status: AC
  Administered 2020-09-20: 1452 mL via INTRAVENOUS

## 2020-09-20 MED ORDER — PANTOPRAZOLE SODIUM 40 MG IV SOLR: 40.0000 mg | INTRAVENOUS | Status: DC

## 2020-09-20 MED ORDER — BACLOFEN 10 MG PO TABS
10.0000 mg | ORAL_TABLET | Freq: Three times a day (TID) | ORAL | Status: DC
  Administered 2020-09-20 – 2020-09-21 (×2): 10 mg via ORAL
  Filled 2020-09-20 (×6): qty 1

## 2020-09-20 MED ORDER — INSULIN ASPART 100 UNIT/ML ~~LOC~~ SOLN
0.0000 [IU] | Freq: Every day | SUBCUTANEOUS | Status: DC
  Filled 2020-09-20: qty 1

## 2020-09-20 MED ORDER — DEXTROSE IN LACTATED RINGERS 5 % IV SOLN: INTRAVENOUS | Status: DC

## 2020-09-20 MED ORDER — IOHEXOL 300 MG/ML  SOLN
75.0000 mL | Freq: Once | INTRAMUSCULAR | Status: AC | PRN
  Administered 2020-09-20: 75 mL via INTRAVENOUS

## 2020-09-20 MED ORDER — APIXABAN 5 MG PO TABS
5.0000 mg | ORAL_TABLET | Freq: Two times a day (BID) | ORAL | Status: DC
  Administered 2020-09-20 – 2020-09-21 (×2): 5 mg via ORAL
  Filled 2020-09-20 (×3): qty 1

## 2020-09-20 NOTE — ED Provider Notes (Signed)
[facility] Emergency Department Provider Note  ____________________________________________   Event Date/Time   First MD Initiated Contact with Patient 09/20/20 1011     (approximate)  I have reviewed the triage vital signs and the nursing notes.   HISTORY  Chief Complaint Abdominal Pain   HPI Evan HOLNESS Sr. is a 65 y.o. male with medical history significant forparoxysmal atrial fibrillation on Eliquis, hypertension, insulin-dependent diabetes mellitus, CKD, and CVA w/ residual LUE weakness who presents via EMS from home for for assessment of some right upper quadrant abdominal pain associate with nonbloody nonbilious vomiting that began last night.  Patient states he has no other acute symptoms including headache earache sore throat, fevers, chills, cough, shortness of breath, chest pain, lower abdominal pain, back pain, diarrhea, constipation, urinary symptoms, rash or extremity pain.  He denies any significant NSAID use, recent EtOH use with drug use.  Does endorse tobacco abuse.         Past Medical History:  Diagnosis Date  . Carotid arterial disease ([place])    a. 08/2018 Carotid U/S: min-mod RICA atherosclerosis w/o hemodynamically significant stenosis. Nl LICA.  Marland Kitchen Chest pain    a. 08/2019 MV: EF 55%. No ischemia. Diaph attenuation noted.  . Diabetes 1.5, managed as type 2 ([place])   . Diastolic dysfunction    a. 08/2018 Echo: EF 65%. No rwma. Gr1 DD. Mild MR.  . Diastolic dysfunction    a. 08/2019 Echo: EF 55-60%, Gr1 DD. No rwma. Mild MR. RVSP 37.25mHg.  .Marland KitchenHypercholesterolemia   . Hypertension   . Left hemiparesis ([place]    a. Ambulated w/ Cane. Limited use of LUE.  .Marland KitchenPAF (paroxysmal atrial fibrillation) (HI[place]    a. 10/2019 Event Monitor: PAF; b. CHA2DS2VASc = 5-->Eliquis.  . Pain in both feet   . Poorly controlled diabetes mellitus (HVol[place]    a. 04/2019 A1c 13.8.  .Marland KitchenRecurrent strokes (HNorth [place]    a. 10/2016 MRI/A: Acute 5473mR thalamic infarct, ?  subacute infarct of R corona radiata; b. 08/2017 MRI/A: Acute 73m63materal L thalamic infarct. Other more remote lacunar infarcts of thalami bilat. Small vessel dzs; c. 08/2018 MRI/A: Acute lacunar infarct of the post limb of R internal capsule; d. 08/2019 MRI Acute CVA of L paramedian pons adn R cerebellar hemisphere.  . Tobacco abuse     Patient Active Problem List   Diagnosis Date Noted  . DKA (diabetic ketoacidosis) (HCCGenoa2/11/2020  . PAF (paroxysmal atrial fibrillation) (HCCPierp[place] . Abnormal EKG 08/16/2020  . Hypertensive urgency 08/05/2020  . CKD (chronic kidney disease) stage 2, GFR 60-89 ml/min 06/27/2020  . Monitoring for anticoagulant use 06/27/2020  . Lower extremity edema 05/16/2020  . Paroxysmal atrial fibrillation (HCCLos Alamitos3/06/2020  . Chest pain of uncertain etiology 09/01/08/9604 History of CVA (cerebrovascular accident) 09/05/2019  . Hyperglycemia 09/05/2019  . Anemia 09/05/2019  . AKI (acute kidney injury) (HCCRichland1/19/2021  . Atypical pneumonia 09/05/2019  . Coagulopathy (HCCDallas0/26/2020  . Gastroesophageal reflux disease   . Hemiplegia and hemiparesis following cerebral infarction affecting left non-dominant side (HCCHotchkiss4/05/2019  . 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 RBBB 10/15/2018  . Sinus bradycardia 10/15/2018  . Dysphagia, post-stroke   . Neuropathic pain   . Lacunar infarct, acute (HCCChamberino1/21/2020  . TIA (transient ischemic attack) 09/01/2018  . Microalbuminuria 07/29/2018  . Left leg pain 01/13/2018  . Hyperlipidemia LDL goal <70 02/11/2017  . Recurrent strokes (HCCScreven3/27/2018  . Essential hypertension 11/10/2016  .  Type 2 diabetes mellitus with hypoglycemia without coma ([place]) 11/10/2016  . Tobacco abuse 08/28/2013    Past Surgical History:  Procedure Laterality Date  . ESOPHAGOGASTRODUODENOSCOPY (EGD) WITH PROPOFOL N/A 04/26/2019   Procedure: ESOPHAGOGASTRODUODENOSCOPY (EGD) WITH PROPOFOL;  Surgeon: Lin Landsman, MD;  Location: [place];  Service:  Gastroenterology;  Laterality: N/A;    Prior to Admission medications   Medication Sig Start Date End Date Taking? Authorizing Provider  ACCU-CHEK GUIDE test strip  08/26/20   [provider]  Accu-Chek Softclix Lancets lancets SMARTSIG:Topical 08/26/20   [provider]  amLODipine (NORVASC) 10 MG tablet Take 1 tablet (10 mg total) by mouth daily. 08/15/20   End, Harrell Gave, MD  apixaban (ELIQUIS) 5 MG TABS tablet Take 1 tablet (5 mg total) by mouth 2 (two) times daily. 08/15/20   End, Harrell Gave, MD  atorvastatin (LIPITOR) 40 MG tablet Take 1 tablet (40 mg total) by mouth daily. 08/15/20   End, Harrell Gave, MD  baclofen (LIORESAL) 10 MG tablet TAKE ONE TABLET BY MOUTH THREE TIMES A DAY 07/16/20   Towanda Malkin, MD  Blood Glucose Monitoring Suppl (FIFTY50 GLUCOSE METER 2.0) w/Device KIT Use as instructed 05/05/18   [provider]  Blood Glucose Monitoring Suppl KIT Accucheck Guide with lancets and test strips #100 with one refill.  Test blood sugar twice daily. DX Code E11.65 ; Z79.4 07/09/20   Towanda Malkin, MD  gabapentin (NEURONTIN) 300 MG capsule Take 2 capsules (600 mg total) by mouth 2 (two) times daily. 02/13/20   Delsa Grana, PA-C  glipiZIDE (GLUCOTROL) 5 MG tablet TAKE 1 TABLET BY MOUTH DAILY BEFORE BREAKFAST 03/21/20   Towanda Malkin, MD  hydrochlorothiazide (HYDRODIURIL) 25 MG tablet Take 1 tablet (25 mg total) by mouth daily. 08/15/20   End, Harrell Gave, MD  insulin detemir (LEVEMIR FLEXTOUCH) 100 UNIT/ML FlexPen Inject 35 Units into the skin daily. 08/27/20   Towanda Malkin, MD  Insulin Pen Needle (NOVOFINE) 32G X 6 MM MISC 1 each by Does not apply route once a week. 07/18/19   Hubbard Hartshorn, FNP  lisinopril (ZESTRIL) 40 MG tablet Take 1 tablet (40 mg total) by mouth daily. 08/15/20   Nelva Bush, MD  Misc. Devices MISC One pair of Compression stockings  Hemiplegia and hemiparesis following cerebral infarction  affecting left non-dominant side ([place]) - Primary Codes: B35.789 Lower extremity edema  Codes: R60.0 Type 2 diabetes mellitus with hyperglycemia, with long-term current use of insulin ([place])  Codes: E11.65, Z79.4 May 26, 2020   Towanda Malkin, MD  omeprazole (PRILOSEC) 40 MG capsule TAKE 1 CAPSULE BY MOUTH DAILY BEFORE BREAKFAST 06/25/20 07/25/20  Jonathon Bellows, MD  pantoprazole (PROTONIX) 40 MG tablet TAKE 1 TABLET BY MOUTH DAILY BEFORE BREAKFAST 02/13/20   Delsa Grana, PA-C  spironolactone (ALDACTONE) 25 MG tablet Take 1 tablet (25 mg total) by mouth daily. 08/15/20   End, Harrell Gave, MD    Allergies Patient has no known allergies.  Family History  Problem Relation Age of Onset  . Hypertension Mother   . Stroke Mother        died @ age 56  . Hypertension Father   . Diabetes Father   . Heart attack Father        died @ 65    Social History Social History   Tobacco Use  . Smoking status: Former Smoker    Packs/day: 1.00    Years: 38.00    Pack years: 38.00    Types: Cigarettes  Start date: 51  . Smokeless tobacco: Never Used  Vaping Use  . Vaping Use: Never used  Substance Use Topics  . Alcohol use: Not Currently    Alcohol/week: 1.0 standard drink    Types: 1 Cans of beer per week    Comment: previously drank but nothing in 1-2 yrs (08/2019).  . Drug use: Not Currently    Types: Cocaine, Marijuana    Comment: prev used cocaine/marijuana but none x 1-2 yrs (08/2019).    Review of Systems  Review of Systems  Constitutional: Negative for chills and fever.  HENT: Negative for sore throat.   Eyes: Negative for pain.  Respiratory: Negative for cough and stridor.   Cardiovascular: Negative for chest pain.  Gastrointestinal: Positive for abdominal pain, nausea and vomiting.  Skin: Negative for rash.  Neurological: Negative for seizures, loss of consciousness and headaches.  Psychiatric/Behavioral: Negative for suicidal ideas.  All other systems reviewed and are  negative.     ____________________________________________   PHYSICAL EXAM:  VITAL SIGNS: ED Triage Vitals  Enc Vitals Group     BP 09/20/20 0900 (!) 166/90     Pulse Rate 09/20/20 0900 91     Resp 09/20/20 0900 16     Temp --      Temp Source 09/20/20 0900 Oral     SpO2 09/20/20 0900 100 %     Weight 09/20/20 0901 160 lb (72.6 kg)     Height 09/20/20 0901 _0  (1.651 m)     Head Circumference --      Peak Flow --      Pain Score 09/20/20 0900 10     Pain Loc --      Pain Edu? --      Excl. in [place]? --    Vitals:   09/20/20 1104 09/20/20 1306  BP: (!) 160/82 (!) 160/80  Pulse: 79 74  Resp: 20 15  SpO2: 100% 99%   Physical Exam Vitals and nursing note reviewed.  Constitutional:      Appearance: He is well-developed and well-nourished.  HENT:     Head: Normocephalic and atraumatic.     Right Ear: External ear normal.     Left Ear: External ear normal.     Nose: Nose normal.  Eyes:     Conjunctiva/sclera: Conjunctivae normal.  Cardiovascular:     Rate and Rhythm: Normal rate and regular rhythm.     Heart sounds: No murmur heard.   Pulmonary:     Effort: Pulmonary effort is normal. No respiratory distress.     Breath sounds: Normal breath sounds.  Abdominal:     Palpations: Abdomen is soft.     Tenderness: There is abdominal tenderness in the right upper quadrant and epigastric area. There is no right CVA tenderness or left CVA tenderness.  Musculoskeletal:        General: No edema.     Cervical back: Neck supple.  Skin:    General: Skin is warm and dry.     Capillary Refill: Capillary refill takes 2 to 3 seconds.  Neurological:     Mental Status: He is alert and oriented to person, place, and time. Mental status is at baseline.     Motor: Weakness ( LUE) present.  Psychiatric:        Mood and Affect: Mood and affect and mood normal.      ____________________________________________   LABS (all labs ordered are listed, but only abnormal results are  displayed)  Labs  Reviewed  COMPREHENSIVE METABOLIC PANEL - Abnormal; Notable for the following components:      Result Value   CO2 18 (*)    Glucose, Bld 372 (*)    BUN 42 (*)    Creatinine, Ser 1.66 (*)    Total Protein 8.6 (*)    ALT 57 (*)    Alkaline Phosphatase 156 (*)    GFR, Estimated 46 (*)    Anion gap 16 (*)    All other components within normal limits  CBC - Abnormal; Notable for the following components:   RBC 4.09 (*)    Hemoglobin 12.0 (*)    HCT 36.3 (*)    All other components within normal limits  URINALYSIS, COMPLETE (UACMP) WITH MICROSCOPIC - Abnormal; Notable for the following components:   Color, Urine STRAW (*)    APPearance CLEAR (*)    Glucose, UA >=500 (*)    Hgb urine dipstick SMALL (*)    Protein, ur 100 (*)    All other components within normal limits  BLOOD GAS, VENOUS - Abnormal; Notable for the following components:   pCO2, Ven 41 (*)    pO2, Ven 66.0 (*)    Bicarbonate 19.7 (*)    Acid-base deficit 6.5 (*)    All other components within normal limits  BASIC METABOLIC PANEL - Abnormal; Notable for the following components:   CO2 18 (*)    Glucose, Bld 324 (*)    BUN 40 (*)    Creatinine, Ser 1.58 (*)    GFR, Estimated 49 (*)    Anion gap 16 (*)    All other components within normal limits  CBG MONITORING, ED - Abnormal; Notable for the following components:   Glucose-Capillary 363 (*)    All other components within normal limits  CBG MONITORING, ED - Abnormal; Notable for the following components:   Glucose-Capillary 310 (*)    All other components within normal limits  CBG MONITORING, ED - Abnormal; Notable for the following components:   Glucose-Capillary 304 (*)    All other components within normal limits  CBG MONITORING, ED - Abnormal; Notable for the following components:   Glucose-Capillary 288 (*)    All other components within normal limits  SARS CORONAVIRUS 2 (TAT 6-24 HRS)  LIPASE, BLOOD  BETA-HYDROXYBUTYRIC ACID   MAGNESIUM  HEMOGLOBIN A1C  HIV ANTIBODY (ROUTINE TESTING W REFLEX)  BASIC METABOLIC PANEL  BASIC METABOLIC PANEL  BASIC METABOLIC PANEL  BASIC METABOLIC PANEL  BETA-HYDROXYBUTYRIC ACID  BETA-HYDROXYBUTYRIC ACID  CBG MONITORING, ED  TROPONIN I (HIGH SENSITIVITY)  TROPONIN I (HIGH SENSITIVITY)   ____________________________________________  EKG  Sinus rhythm with a ventricular rate of 79, normal axis, prolonged QTc interval at 532, right bundle branch block, and multiple T wave inversions including in the anterior septal leads as well as leads I and aVL that are present on ECG obtained on 08/15/2020. No clear acute changes on today's EKG when compared to prior. ____________________________________________  RADIOLOGY  ED MD interpretation:  CT abdomen pelvis with contrast shows no evidence of pancreatitis, cholecystitis, splenic abnormality, adrenal pathology, SBO, diverticulitis, appendicitis, cholecystitis or other acute intra-abdominal pelvic pathology.  Chest x-ray shows no evidence of pneumonia, thorax, large effusion, rib fracture or other clear acute intrathoracic process.  Official radiology report(s): DG Chest 2 View  Result Date: 09/20/2020 CLINICAL DATA:  Right lower rib pain EXAM: CHEST - 2 VIEW COMPARISON:  09/04/2019 FINDINGS: Heart size upper normal. Negative for heart failure. Mild bibasilar atelectasis. Negative for  infiltrate or effusion. No acute rib fracture. IMPRESSION: Mild bibasilar atelectasis. Electronically Signed   By: Franchot Gallo M.D.   On: 09/20/2020 11:11   CT ABDOMEN PELVIS W CONTRAST  Result Date: 09/20/2020 CLINICAL DATA:  Right upper quadrant abdominal pain and vomiting. EXAM: CT ABDOMEN AND PELVIS WITH CONTRAST TECHNIQUE: Multidetector CT imaging of the abdomen and pelvis was performed using the standard protocol following bolus administration of intravenous contrast. CONTRAST:  59m OMNIPAQUE IOHEXOL 300 MG/ML  SOLN COMPARISON:  None. FINDINGS: Lower  chest: There may be scattered peribronchovascular nodularity, of unknown acuity and possibly postinfectious in etiology. Image quality is degraded by respiratory motion. Heart is at the upper limits of normal in size. No pericardial or pleural effusion. Small hiatal hernia. Hepatobiliary: Liver and gallbladder are unremarkable. No biliary ductal dilatation. Pancreas: Negative. Spleen: Negative. Adrenals/Urinary Tract: Adrenal glands and kidneys are unremarkable. Ureters are decompressed. Nodular and enlarged prostate indents the bladder slightly. Bladder is otherwise grossly unremarkable. Stomach/Bowel: Small hiatal hernia. Stomach, small bowel, appendix and colon are otherwise unremarkable. Vascular/Lymphatic: Atherosclerotic calcification of the aorta without aneurysm. No pathologically enlarged lymph nodes. Reproductive: Prostate is enlarged, nodular and indents the bladder. Other: No free fluid.  Mesenteries and peritoneum are unremarkable. Musculoskeletal: Degenerative changes in the spine. No worrisome lytic or sclerotic lesions. IMPRESSION: 1. No acute findings to explain the patient's clinical history. 2. Nodular and enlarged prostate. 3.  Aortic atherosclerosis (ICD10-I70.0). Electronically Signed   By: MLorin PicketM.D.   On: 09/20/2020 11:30    ____________________________________________   PROCEDURES  Procedure(s) performed (including Critical Care):  .1-3 Lead EKG Interpretation Performed by: SLucrezia Starch MD Authorized by: SLucrezia Starch MD     Interpretation: normal     ECG rate assessment: normal     Rhythm: sinus rhythm     Ectopy: none     Conduction: normal       ____________________________________________   INITIAL IMPRESSION / ASSESSMENT AND PLAN / ED COURSE      Patient presents with above to history exam for assessment of some epigastric and right upper quadrant abdominal pain associate with nonbloody nonbilious vomiting began last night.  He still  hypertensive with BP of 166/90 with otherwise stable vital signs on room air.  Primary differential includes but is not limited to acute cholecystitis, pancreatitis, cholangitis, pneumonia, gastritis, pyonephritis, diverticulitis, and atypical present of ACS.  Low suspicion for PE as patient is anticoagulant Eliquis for his paroxysmal A. fib and has been compliant with all his medicines.  Chest x-ray obtained shows no evidence of pneumonia or other clear acute thoracic process. ECG appears largely unchanged when compared to prior.  CT abdomen pelvis with contrast shows no evidence of pancreatitis, cholecystitis, splenic abnormality, adrenal pathology, SBO, diverticulitis, appendicitis, cholecystitis or other acute intra-abdominal pelvic pathology.   Lipase of 25 not consistent with acute pancreatitis.  CMP remarkable for glucose of 372 with a bicarb of 18 and anion gap of 16 as well as a creatinine of 1.66 compared to 0.943-monthgo.  Patient also has very mild elevation of AST at 57.  CBC shows no leukocytosis and hemoglobin at baseline.  Given mild anion gap acidosis and hyperglycemia VBG and hydroxybutyrate obtained to assess for evidence of DKA.  BG remarkable for pH of 7.29 with a PCO2 of 41 and a bicarb of 19.7.  Beta hydroxybutyrate within normal limits at 0.18.  Suspect very mild DKA with component of dehydration.  Patient treated with IV fluids and insulin.  Troponin is 8 this was obtained greater than 3 hours after symptom onset and given patient denies any chest pain low suspicion for atypical presentation of ACS.  Magnesium WNL.   Repeat BMP essentially unchanged with bicarb of 18 anion gap of 16.  This is in the setting of patient drinking fluids and receiving IV boluses noted below.  He has also received several doses of sliding scale insulin.  Given concern for persistent persistent anion gap acidosis likely secondary to dehydration but right on the border of DKA we will plan to [place]  for observation and IV fluids as well as close glucose control.  ____________________________________________   FINAL CLINICAL IMPRESSION(S) / ED DIAGNOSES  Final diagnoses:  Pain of upper abdomen  Non-intractable vomiting with nausea, unspecified vomiting type  Hyperglycemia  Hypertension, unspecified type  Atherosclerosis    Medications  insulin aspart (novoLOG) injection 0-15 Units (15 Units Subcutaneous Given 09/20/20 1035)  lactated ringers infusion ( Intravenous Stopped 09/20/20 1319)  ondansetron (ZOFRAN) injection 4 mg (4 mg Intravenous Patient Refused/Not Given 09/20/20 1349)  atorvastatin (LIPITOR) tablet 40 mg (has no administration in time range)  amLODipine (NORVASC) tablet 10 mg (has no administration in time range)  apixaban (ELIQUIS) tablet 5 mg (has no administration in time range)  baclofen (LIORESAL) tablet 10 mg (has no administration in time range)  gabapentin (NEURONTIN) capsule 600 mg (has no administration in time range)  lactated ringers bolus 1,452 mL (has no administration in time range)  insulin regular, human (MYXREDLIN) 100 units/ 100 mL infusion (has no administration in time range)  lactated ringers infusion (has no administration in time range)  dextrose 5 % in lactated ringers infusion (has no administration in time range)  dextrose 50 % solution 0-50 mL (has no administration in time range)  potassium chloride 10 mEq in 100 mL IVPB (has no administration in time range)  metoCLOPramide (REGLAN) injection 10 mg (has no administration in time range)  pantoprazole (PROTONIX) injection 40 mg (has no administration in time range)  lactated ringers bolus 1,000 mL (0 mLs Intravenous Stopped 09/20/20 1250)  iohexol (OMNIPAQUE) 300 MG/ML solution 75 mL (75 mLs Intravenous Contrast Given 09/20/20 1112)  HYDROmorphone (DILAUDID) injection 1 mg (1 mg Intravenous Given 09/20/20 1132)  pantoprazole (PROTONIX) injection 40 mg (40 mg Intravenous Given 09/20/20 1250)  alum &  mag hydroxide-simeth (MAALOX/MYLANTA) 200-200-20 MG/5ML suspension 30 mL (30 mLs Oral Given 09/20/20 1251)  lactated ringers bolus 1,000 mL (1,000 mLs Intravenous New Bag/Given 09/20/20 1319)     ED Discharge Orders    None       Note:  This document was prepared using Dragon voice recognition software and may include unintentional dictation errors.   Lucrezia Starch, MD 09/20/20 [id number]

## 2020-09-20 NOTE — Telephone Encounter (Signed)
Copied from CRM [id number]. Topic: General - Other >> Sep 20, 2020  9:30 AM Gwenlyn Fudge wrote: Reason for CRM: Pts wife called stating that the pt is currently in the hospital due to chest pains and high blood sugar. She states that she just wants to make PCP aware. Please advise.

## 2020-09-20 NOTE — ED Notes (Signed)
Per lab, will add on drug screen to urine sample already sent down

## 2020-09-20 NOTE — ED Triage Notes (Signed)
Pt in via EMS from home. EMS reports pt with right upper abd pain and vomiting. HX of diabetes and cva and HTN. FSBS 409. Pt was given 35 units of insulin. 154/68, HR 80's, 100 RA

## 2020-09-20 NOTE — ED Notes (Signed)
Per verbal order from [facility] MD, stop LR @125mL /hr and run it as a bolus at this time.

## 2020-09-20 NOTE — ED Notes (Signed)
Requested new dose of baclofen from pharmacy at this time, as it fell on floor during administration. Per pharmacy, will replacement dose

## 2020-09-20 NOTE — ED Notes (Addendum)
Messaged admitting NP [place] regarding pt emesis episode and possible transition off endotool at this time. Awaiting further orders at this time

## 2020-09-20 NOTE — H&P (Signed)
History and Physical    Evan Townsend ZGY:[id number] DOB: February 22, 1956 DOA: 09/20/2020  PCP: Evan Malkin, MD (Inactive)   Patient coming from: Home  I have personally briefly reviewed patient's old medical records in [place]  Chief Complaint: Abdominal pain                               Nausea/vomiting  HPI: Evan KYE Sr. is a 65 y.o. male with medical history significant for insulin-dependent diabetes mellitus, hypertension, history of CVA with left hemiparesis, history of paroxysmal A. fib on chronic anticoagulation therapy who presents to the ER via EMS for evaluation of right upper quadrant pain associated with nausea and vomiting.  Patient had a blood sugar of 490 in the field and was given 35 units of insulin.  He rates his pain a 6 x 10 in intensity at its worst, it is nonradiating and has no aggravating or relieving factors. He denies having any fever, no chills, no cough, no shortness of breath, no chest pain, no diarrhea, no back pain, no constipation, no urinary frequency, no dysuria, no nocturia, no palpitations or diaphoresis. Arterial blood gas 7.29/41/66/19.7/90 Sodium 135, potassium 3.5, chloride 101, bicarb 18, glucose 224, BUN 40, creatinine 1.58, calcium 8.9, magnesium 1.9, white count 8.8, hemoglobin 12.0, hematocrit 36.3, MCV 88, RDW 15.1, platelet count 312 CT scan of abdomen pelvis shows no acute findings to explain the patient's clinical history.  Nodule and enlarged prostate.  Patient noted to have a small hiatal hernia.  Liver and gallbladder are unremarkable. Chest x-ray reviewed by me shows mild bibasilar atelectasis 12-lead EKG reviewed by me shows normal sinus rhythm with a right bundle branch block   ED Course: Patient is a 64 year old African-American male with a past medical history significant for insulin-dependent diabetes mellitus, paroxysmal A. fib and history of CVA with left-sided hemiparesis who presents to the ER for  evaluation of abdominal pain, nausea and vomiting.  Patient was noted to have hyperglycemia with an anion gap metabolic acidosis.  He will be started on an insulin drip and admitted to the hospital.  Review of Systems: As per HPI otherwise all systems reviewed and negative.    Past Medical History:  Diagnosis Date  . Carotid arterial disease ([place])    a. 08/2018 Carotid U/S: min-mod RICA atherosclerosis w/o hemodynamically significant stenosis. Nl LICA.  Marland Kitchen Chest pain    a. 08/2019 MV: EF 55%. No ischemia. Diaph attenuation noted.  . Diabetes 1.5, managed as type 2 ([place])   . Diastolic dysfunction    a. 08/2018 Echo: EF 65%. No rwma. Gr1 DD. Mild MR.  . Diastolic dysfunction    a. 08/2019 Echo: EF 55-60%, Gr1 DD. No rwma. Mild MR. RVSP 37.31mHg.  .Marland KitchenHypercholesterolemia   . Hypertension   . Left hemiparesis ([place]    a. Ambulated w/ [place]ane. Limited use of LUE.  .Marland KitchenPAF (paroxysmal atrial fibrillation) ([place]    a. 10/2019 Event Monitor: PAF; b. CHA2DS2VASc = 5-->Eliquis.  . Pain in both feet   . Poorly controlled diabetes mellitus ([place]    a. 04/2019 A1c 13.8.  .Marland KitchenRecurrent strokes ([place]    a. 10/2016 MRI/A: Acute 559mR thalamic infarct, ? subacute infarct of R corona radiata; b. 08/2017 MRI/A: Acute 16m716materal L thalamic infarct. Other more remote lacunar infarcts of thalami bilat. Small vessel dzs; c. 08/2018 MRI/A: Acute lacunar infarct of the post limb  of R internal capsule; d. 08/2019 MRI Acute CVA of L paramedian pons adn R cerebellar hemisphere.  . Tobacco abuse     Past Surgical History:  Procedure Laterality Date  . ESOPHAGOGASTRODUODENOSCOPY (EGD) WITH PROPOFOL N/A 04/26/2019   Procedure: ESOPHAGOGASTRODUODENOSCOPY (EGD) WITH PROPOFOL;  Surgeon: Evan Landsman, MD;  Location: [facility];  Service: Gastroenterology;  Laterality: N/A;     reports that he has quit smoking. His smoking use included cigarettes. He started smoking about 40 years ago. He has a 38.00 pack-year smoking  history. He has never used smokeless tobacco. He reports previous alcohol use of about 1.0 standard drink of alcohol per week. He reports previous drug use. Drugs: Cocaine and Marijuana.  No Known Allergies  Family History  Problem Relation Age of Onset  . Hypertension Mother   . Stroke Mother        died @ age 54  . Hypertension Father   . Diabetes Father   . Heart attack Father        died @ 8     Prior to Admission medications   Medication Sig Start Date End Date Taking? Authorizing Provider  ACCU-CHEK GUIDE test strip  08/26/20   [provider]  Accu-Chek Softclix Lancets lancets SMARTSIG:Topical 08/26/20   [provider]  amLODipine (NORVASC) 10 MG tablet Take 1 tablet (10 mg total) by mouth daily. 08/15/20   End, Evan Gave, MD  apixaban (ELIQUIS) 5 MG TABS tablet Take 1 tablet (5 mg total) by mouth 2 (two) times daily. 08/15/20   End, Evan Gave, MD  atorvastatin (LIPITOR) 40 MG tablet Take 1 tablet (40 mg total) by mouth daily. 08/15/20   End, Evan Gave, MD  baclofen (LIORESAL) 10 MG tablet TAKE ONE TABLET BY MOUTH THREE TIMES A DAY 07/16/20   Evan Malkin, MD  Blood Glucose Monitoring Suppl (FIFTY50 GLUCOSE METER 2.0) w/Device KIT Use as instructed 05/05/18   [provider]  Blood Glucose Monitoring Suppl KIT Accucheck Guide with lancets and test strips #100 with one refill.  Test blood sugar twice daily. DX Code E11.65 ; Z79.4 07/09/20   Evan Malkin, MD  gabapentin (NEURONTIN) 300 MG capsule Take 2 capsules (600 mg total) by mouth 2 (two) times daily. 02/13/20   Evan Grana, PA-C  glipiZIDE (GLUCOTROL) 5 MG tablet TAKE 1 TABLET BY MOUTH DAILY BEFORE BREAKFAST 03/21/20   Evan Malkin, MD  hydrochlorothiazide (HYDRODIURIL) 25 MG tablet Take 1 tablet (25 mg total) by mouth daily. 08/15/20   End, Evan Gave, MD  insulin detemir (LEVEMIR FLEXTOUCH) 100 UNIT/ML FlexPen Inject 35 Units into the skin daily. 08/27/20    Evan Malkin, MD  Insulin Pen Needle (NOVOFINE) 32G X 6 MM MISC 1 each by Does not apply route once a week. 07/18/19   Evan Hartshorn, FNP  lisinopril (ZESTRIL) 40 MG tablet Take 1 tablet (40 mg total) by mouth daily. 08/15/20   Nelva Bush, MD  Misc. Devices MISC One pair of Compression stockings  Hemiplegia and hemiparesis following cerebral infarction affecting left non-dominant side ([place]) - Primary Codes: W29.562 Lower extremity edema  Codes: R60.0 Type 2 diabetes mellitus with hyperglycemia, with long-term current use of insulin ([place])  Codes: E11.65, Z79.4 2020-06-01   Evan Malkin, MD  omeprazole (PRILOSEC) 40 MG capsule TAKE 1 CAPSULE BY MOUTH DAILY BEFORE BREAKFAST 06/25/20 07/25/20  Jonathon Bellows, MD  pantoprazole (PROTONIX) 40 MG tablet TAKE 1 TABLET BY MOUTH DAILY BEFORE BREAKFAST 02/13/20   Evan Grana, PA-C  spironolactone (ALDACTONE) 25 MG tablet Take 1 tablet (25 mg total) by mouth daily. 08/15/20   Nelva Bush, MD    Physical Exam: Vitals:   09/20/20 1020 09/20/20 1020 09/20/20 1104 09/20/20 1306  BP:  (!) 154/68 (!) 160/82 (!) 160/80  Pulse: 73 79 79 74  Resp: _0 TempSrc:      SpO2: 94% 100% 100% 99%  Weight:      Height:         Vitals:   09/20/20 1020 09/20/20 1020 09/20/20 1104 09/20/20 1306  BP:  (!) 154/68 (!) 160/82 (!) 160/80  Pulse: 73 79 79 74  Resp: _1 TempSrc:      SpO2: 94% 100% 100% 99%  Weight:      Height:        Constitutional: NAD, alert and oriented x 3. Acutely ill appearing Eyes: PERRL, lids and conjunctivae normal ENMT: Mucous membranes are dry  Neck: normal, supple, no masses, no thyromegaly Respiratory: clear to auscultation bilaterally, no wheezing, no crackles. Normal respiratory effort. No accessory muscle use.  Cardiovascular: Regular rate and rhythm, no murmurs / rubs / gallops. No extremity edema. 2+ pedal pulses. No carotid bruits.  Abdomen: RUQ tenderness, no masses palpated. No  hepatosplenomegaly. Bowel sounds positive.  Musculoskeletal: no clubbing / cyanosis. No joint deformity upper and lower extremities.  Skin: no rashes, lesions, ulcers.  Neurologic: No gross focal neurologic deficit. Left sided hemiparesis Psychiatric: Normal mood and affect.   Labs on Admission: I have personally reviewed following labs and imaging studies  CBC: Recent Labs  Lab 09/20/20 0903  WBC 8.8  HGB 12.0*  HCT 36.3*  MCV 88.8  PLT 149   Basic Metabolic Panel: Recent Labs  Lab 09/20/20 0903 09/20/20 1037 09/20/20 1239  NA 137  --  135  K 3.7  --  3.5  CL 103  --  101  CO2 18*  --  18*  GLUCOSE 372*  --  324*  BUN 42*  --  40*  CREATININE 1.66*  --  1.58*  CALCIUM 9.4  --  8.9  MG  --  1.9  --    GFR: Estimated Creatinine Clearance: 41.6 mL/min (A) (by C-G formula based on SCr of 1.58 mg/dL (H)). Liver Function Tests: Recent Labs  Lab 09/20/20 0903  AST 35  ALT 57*  ALKPHOS 156*  BILITOT 0.3  PROT 8.6*  ALBUMIN 4.3   Recent Labs  Lab 09/20/20 0903  LIPASE 25   No results for input(s): AMMONIA in the last 168 hours. Coagulation Profile: No results for input(s): INR, PROTIME in the last 168 hours. Cardiac Enzymes: No results for input(s): CKTOTAL, CKMB, CKMBINDEX, TROPONINI in the last 168 hours. BNP (last 3 results) No results for input(s): PROBNP in the last 8760 hours. HbA1C: No results for input(s): HGBA1C in the last 72 hours. CBG: Recent Labs  Lab 09/20/20 1030 09/20/20 1133 09/20/20 1247 09/20/20 1348  GLUCAP 363* 310* 304* 288*   Lipid Profile: No results for input(s): CHOL, HDL, LDLCALC, TRIG, CHOLHDL, LDLDIRECT in the last 72 hours. Thyroid Function Tests: No results for input(s): TSH, T4TOTAL, FREET4, T3FREE, THYROIDAB in the last 72 hours. Anemia Panel: No results for input(s): VITAMINB12, FOLATE, FERRITIN, TIBC, IRON, RETICCTPCT in the last 72 hours. Urine analysis:    Component Value Date/Time   COLORURINE STRAW (A)  09/20/2020 0902   APPEARANCEUR CLEAR (A) 09/20/2020 0902   LABSPEC 1.018 09/20/2020 0902   PHURINE  5.0 09/20/2020 0902   GLUCOSEU >=500 (A) 09/20/2020 0902   HGBUR SMALL (A) 09/20/2020 0902   BILIRUBINUR NEGATIVE 09/20/2020 0902   KETONESUR NEGATIVE 09/20/2020 0902   PROTEINUR 100 (A) 09/20/2020 0902   NITRITE NEGATIVE 09/20/2020 0902   LEUKOCYTESUR NEGATIVE 09/20/2020 0902    Radiological Exams on Admission: DG Chest 2 View  Result Date: 09/20/2020 CLINICAL DATA:  Right lower rib pain EXAM: CHEST - 2 VIEW COMPARISON:  09/04/2019 FINDINGS: Heart size upper normal. Negative for heart failure. Mild bibasilar atelectasis. Negative for infiltrate or effusion. No acute rib fracture. IMPRESSION: Mild bibasilar atelectasis. Electronically Signed   By: Franchot Gallo M.D.   On: 09/20/2020 11:11   CT ABDOMEN PELVIS W CONTRAST  Result Date: 09/20/2020 CLINICAL DATA:  Right upper quadrant abdominal pain and vomiting. EXAM: CT ABDOMEN AND PELVIS WITH CONTRAST TECHNIQUE: Multidetector CT imaging of the abdomen and pelvis was performed using the standard protocol following bolus administration of intravenous contrast. CONTRAST:  88m OMNIPAQUE IOHEXOL 300 MG/ML  SOLN COMPARISON:  None. FINDINGS: Lower chest: There may be scattered peribronchovascular nodularity, of unknown acuity and possibly postinfectious in etiology. Image quality is degraded by respiratory motion. Heart is at the upper limits of normal in size. No pericardial or pleural effusion. Small hiatal hernia. Hepatobiliary: Liver and gallbladder are unremarkable. No biliary ductal dilatation. Pancreas: Negative. Spleen: Negative. Adrenals/Urinary Tract: Adrenal glands and kidneys are unremarkable. Ureters are decompressed. Nodular and enlarged prostate indents the bladder slightly. Bladder is otherwise grossly unremarkable. Stomach/Bowel: Small hiatal hernia. Stomach, small bowel, appendix and colon are otherwise unremarkable. Vascular/Lymphatic:  Atherosclerotic calcification of the aorta without aneurysm. No pathologically enlarged lymph nodes. Reproductive: Prostate is enlarged, nodular and indents the bladder. Other: No free fluid.  Mesenteries and peritoneum are unremarkable. Musculoskeletal: Degenerative changes in the spine. No worrisome lytic or sclerotic lesions. IMPRESSION: 1. No acute findings to explain the patient's clinical history. 2. Nodular and enlarged prostate. 3.  Aortic atherosclerosis (ICD10-I70.0). Electronically Signed   By: MLorin PicketM.D.   On: 09/20/2020 11:30    EKG: Independently reviewed.  Normal sinus rhythm Right bundle branch block  Assessment/Plan Principal Problem:   DKA (diabetic ketoacidosis) ([place] Active Problems:   Essential hypertension   Gastroesophageal reflux disease   AKI (acute kidney injury) ([place]   PAF (paroxysmal atrial fibrillation) ([place]      DKA Patient presents to the ER for evaluation of nausea, vomiting and abdominal pain and is noted to have hyperglycemia as well as mild anion gap metabolic acidosis.   Aggressive IV fluid resuscitation Start patient on an insulin drip Check and supplement electrolytes every 6 hours Place patient on IV PPI as well as antiemetics   Acute kidney injury Secondary to GI losses from nausea and vomiting as well medication induced At baseline patient has a serum creatinine of 0.96 and today on admission it is 1.58 Hold hydrochlorothiazide, lisinopril and spironolactone Repeat renal parameters in a.m.    Paroxysmal A. Fib Patient is currently in sinus rhythm Continue apixaban as secondary prophylaxis for an acute stroke    Hypertension Continue amlodipine   History of CVA Optimize blood pressure control Continue atorvastatin    DVT prophylaxis:  SCD Code Status: Full code Family Communication: Greater than 50% of time was spent discussing plan of care with patient at the bedside.  All questions and concerns have been  addressed.  He verbalizes understanding and agrees to the plan. Disposition Plan: Back to previous home environment Consults called: None  Collier Bullock MD [facility]     09/20/2020, 2:27 PM

## 2020-09-21 DIAGNOSIS — E1110 Type 2 diabetes mellitus with ketoacidosis without coma: Secondary | ICD-10-CM | POA: Diagnosis not present

## 2020-09-21 LAB — SARS CORONAVIRUS 2 (TAT 6-24 HRS): SARS Coronavirus 2: NEGATIVE

## 2020-09-21 LAB — CBG MONITORING, ED
Glucose-Capillary: 106 mg/dL — ABNORMAL HIGH (ref 70–99)
Glucose-Capillary: 139 mg/dL — ABNORMAL HIGH (ref 70–99)
Glucose-Capillary: 168 mg/dL — ABNORMAL HIGH (ref 70–99)
Glucose-Capillary: 65 mg/dL — ABNORMAL LOW (ref 70–99)
Glucose-Capillary: 91 mg/dL (ref 70–99)

## 2020-09-21 LAB — BASIC METABOLIC PANEL
Anion gap: 11 (ref 5–15)
Anion gap: 9 (ref 5–15)
BUN: 24 mg/dL — ABNORMAL HIGH (ref 8–23)
BUN: 21 mg/dL (ref 8–23)
CO2: 24 mmol/L (ref 22–32)
CO2: 23 mmol/L (ref 22–32)
Calcium: 9.4 mg/dL (ref 8.9–10.3)
Calcium: 9.1 mg/dL (ref 8.9–10.3)
Chloride: 107 mmol/L (ref 98–111)
Chloride: 107 mmol/L (ref 98–111)
Creatinine, Ser: 1.05 mg/dL (ref 0.61–1.24)
Creatinine, Ser: 0.89 mg/dL (ref 0.61–1.24)
GFR, Estimated: >60 mL/min (ref >60)
GFR, Estimated: >60 mL/min (ref >60)
Glucose, Bld: 128 mg/dL — ABNORMAL HIGH (ref 70–99)
Glucose, Bld: 97 mg/dL (ref 70–99)
Potassium: 3.8 mmol/L (ref 3.5–5.1)
Potassium: 3.6 mmol/L (ref 3.5–5.1)
Sodium: 142 mmol/L (ref 135–145)
Sodium: 139 mmol/L (ref 135–145)

## 2020-09-21 LAB — HIV ANTIBODY (ROUTINE TESTING W REFLEX): HIV Screen 4th Generation wRfx: NONREACTIVE

## 2020-09-21 LAB — BETA-HYDROXYBUTYRIC ACID: Beta-Hydroxybutyric Acid: 0.06 mmol/L (ref 0.05–0.27)

## 2020-09-21 MED ORDER — LEVEMIR FLEXTOUCH 100 UNIT/ML ~~LOC~~ SOPN: 40.0000 [IU] | PEN_INJECTOR | Freq: Every day | SUBCUTANEOUS | 1 refills | Status: DC

## 2020-09-21 NOTE — ED Notes (Signed)
Pt with episode of emesis. Pt given zofran IV.

## 2020-09-21 NOTE — Discharge Summary (Signed)
Evan SALCE Sr. ZOX:[id number] DOB: [birth year] DOA: 09/20/2020  PCP: Towanda Malkin, MD (Inactive)  Admit date: 09/20/2020 Discharge date: 09/21/2020  Time spent: 35 minutes  Recommendations for Outpatient Follow-up:  1. Close f/u with PCP for diabetes management     Discharge Diagnoses:  Principal Problem:   DKA (diabetic ketoacidosis) ([place]) Active Problems:   Essential hypertension   Gastroesophageal reflux disease   AKI (acute kidney injury) ([place])   PAF (paroxysmal atrial fibrillation) ([place])   Discharge Condition: good  Diet recommendation: carb modified  Filed Weights   09/20/20 0901  Weight: 72.6 kg    History of present illness:  Evan ROSTRO Sr. is a 65 y.o. male with medical history significant for insulin-dependent diabetes mellitus, hypertension, history of CVA with left hemiparesis, history of paroxysmal A. fib on chronic anticoagulation therapy who presents to the ER via EMS for evaluation of right upper quadrant pain associated with nausea and vomiting.  Patient had a blood sugar of 490 in the field and was given 35 units of insulin.  He rates his pain a 6 x 10 in intensity at its worst, it is nonradiating and has no aggravating or relieving factors. He denies having any fever, no chills, no cough, no shortness of breath, no chest pain, no diarrhea, no back pain, no constipation, no urinary frequency, no dysuria, no nocturia, no palpitations or diaphoresis. Arterial blood gas 7.29/41/66/19.7/90 Sodium 135, potassium 3.5, chloride 101, bicarb 18, glucose 224, BUN 40, creatinine 1.58, calcium 8.9, magnesium 1.9, white count 8.8, hemoglobin 12.0, hematocrit 36.3, MCV 88, RDW 15.1, platelet count 312 CT scan of abdomen pelvis shows no acute findings to explain the patient's clinical history.  Nodule and enlarged prostate.  Patient noted to have a small hiatal hernia.  Liver and gallbladder are unremarkable. Chest x-ray reviewed by me shows mild bibasilar  atelectasis 12-lead EKG reviewed by me shows normal sinus rhythm with a right bundle branch block   ED Course: Patient is a 65 year old African-American male with a past medical history significant for insulin-dependent diabetes mellitus, paroxysmal A. fib and history of CVA with left-sided hemiparesis who presents to the ER for evaluation of abdominal pain, nausea and vomiting.  Patient was noted to have hyperglycemia with an anion gap metabolic acidosis.  He will be started on an insulin drip and admitted to the hospital.  Hospital Course:   DKA Patient presents to the ER for evaluation of nausea, vomiting and abdominal pain and is noted to have hyperglycemia as well as mild anion gap metabolic acidosis.  CT scan of abdomen unremarkable and abd pain resolved. Nausea/vomiting resolved. Treated with insulin and IV fluids. a1c 8.4. no signs of uti, pna. covid neg. - increase home levemir from 35 to 40 units daily - monitor daily fasting sugars - dietary counseling givne - close PCP f/u. Question need to also be on glipizide, preferable would be up-titration of insulin  Acute kidney injury 2/2 dehydration from dka, n/v. Resolved with IV hydration and euglycemia  Paroxysmal A. Fib Patient is currently in sinus rhythm - Continued apixaban as secondary prophylaxis for an acute stroke  Hypertension Here bp elevated w/o home bp meds - resume home bp meds  History of CVA Stable   Procedures:  none   Consultations:  none  Discharge Exam: Vitals:   09/21/20 0533 09/21/20 0930  BP: (!) 173/96 (!) 170/95  Pulse: 80 87  Resp: 16 16  Temp: (!) 97.5 F (36.4 C) 97.7 F (36.5 C)  SpO2: 98% 99%    General: NAD Cardiovascular: RRR, no rubs/murmurs Respiratory: ctab Abdomen: soft, non-tender Ext: no edema, warm  Discharge Instructions   Discharge Instructions    Diet Carb Modified   Complete by: As directed    Increase activity slowly   Complete by: As directed       Allergies as of 09/21/2020   No Known Allergies     Medication List    TAKE these medications   Accu-Chek Guide test strip Generic drug: glucose blood   Accu-Chek Softclix Lancets lancets SMARTSIG:Topical   amLODipine 10 MG tablet Commonly known as: NORVASC Take 1 tablet (10 mg total) by mouth daily.   apixaban 5 MG Tabs tablet Commonly known as: Eliquis Take 1 tablet (5 mg total) by mouth 2 (two) times daily.   atorvastatin 40 MG tablet Commonly known as: LIPITOR Take 1 tablet (40 mg total) by mouth daily.   baclofen 10 MG tablet Commonly known as: LIORESAL TAKE ONE TABLET BY MOUTH THREE TIMES A DAY   Fifty50 Glucose Meter 2.0 w/Device Kit Use as instructed   Blood Glucose Monitoring Suppl Kit Accucheck Guide with lancets and test strips #100 with one refill.  Test blood sugar twice daily. DX Code E11.65 ; Z79.4   gabapentin 300 MG capsule Commonly known as: NEURONTIN Take 2 capsules (600 mg total) by mouth 2 (two) times daily.   glipiZIDE 5 MG tablet Commonly known as: GLUCOTROL TAKE 1 TABLET BY MOUTH DAILY BEFORE BREAKFAST   hydrochlorothiazide 25 MG tablet Commonly known as: HYDRODIURIL Take 1 tablet (25 mg total) by mouth daily.   Levemir FlexTouch 100 UNIT/ML FlexPen Generic drug: insulin detemir Inject 40 Units into the skin daily. What changed: how much to take   lisinopril 40 MG tablet Commonly known as: ZESTRIL Take 1 tablet (40 mg total) by mouth daily.   Misc. Devices Misc One pair of Compression stockings  Hemiplegia and hemiparesis following cerebral infarction affecting left non-dominant side (HCC) - Primary Codes: H54.562 Lower extremity edema  Codes: R60.0 Type 2 diabetes mellitus with hyperglycemia, with long-term current use of insulin (HCC)  Codes: E11.65, Z79.4   NovoFine 32G X 6 MM Misc Generic drug: Insulin Pen Needle 1 each by Does not apply route once a week.   omeprazole 40 MG capsule Commonly known as: PRILOSEC TAKE  1 CAPSULE BY MOUTH DAILY BEFORE BREAKFAST   pantoprazole 40 MG tablet Commonly known as: PROTONIX TAKE 1 TABLET BY MOUTH DAILY BEFORE BREAKFAST   spironolactone 25 MG tablet Commonly known as: ALDACTONE Take 1 tablet (25 mg total) by mouth daily.      No Known Allergies  Follow-up Information    Towanda Malkin, MD. Schedule an appointment as soon as possible for a visit in 3 days.   Specialty: Internal Medicine Contact information: [address] [place] [address] [phone]                The results of significant diagnostics from this hospitalization (including imaging, microbiology, ancillary and laboratory) are listed below for reference.    Significant Diagnostic Studies: DG Chest 2 View  Result Date: 09/20/2020 CLINICAL DATA:  Right lower rib pain EXAM: CHEST - 2 VIEW COMPARISON:  09/04/2019 FINDINGS: Heart size upper normal. Negative for heart failure. Mild bibasilar atelectasis. Negative for infiltrate or effusion. No acute rib fracture. IMPRESSION: Mild bibasilar atelectasis. Electronically Signed   By: Franchot Gallo M.D.   On: 09/20/2020 11:11   CT ABDOMEN PELVIS  W CONTRAST  Result Date: 09/20/2020 CLINICAL DATA:  Right upper quadrant abdominal pain and vomiting. EXAM: CT ABDOMEN AND PELVIS WITH CONTRAST TECHNIQUE: Multidetector CT imaging of the abdomen and pelvis was performed using the standard protocol following bolus administration of intravenous contrast. CONTRAST:  64m OMNIPAQUE IOHEXOL 300 MG/ML  SOLN COMPARISON:  None. FINDINGS: Lower chest: There may be scattered peribronchovascular nodularity, of unknown acuity and possibly postinfectious in etiology. Image quality is degraded by respiratory motion. Heart is at the upper limits of normal in size. No pericardial or pleural effusion. Small hiatal hernia. Hepatobiliary: Liver and gallbladder are unremarkable. No biliary ductal dilatation. Pancreas: Negative. Spleen: Negative.  Adrenals/Urinary Tract: Adrenal glands and kidneys are unremarkable. Ureters are decompressed. Nodular and enlarged prostate indents the bladder slightly. Bladder is otherwise grossly unremarkable. Stomach/Bowel: Small hiatal hernia. Stomach, small bowel, appendix and colon are otherwise unremarkable. Vascular/Lymphatic: Atherosclerotic calcification of the aorta without aneurysm. No pathologically enlarged lymph nodes. Reproductive: Prostate is enlarged, nodular and indents the bladder. Other: No free fluid.  Mesenteries and peritoneum are unremarkable. Musculoskeletal: Degenerative changes in the spine. No worrisome lytic or sclerotic lesions. IMPRESSION: 1. No acute findings to explain the patient's clinical history. 2. Nodular and enlarged prostate. 3.  Aortic atherosclerosis (ICD10-I70.0). Electronically Signed   By: MLorin PicketM.D.   On: 09/20/2020 11:30    Microbiology: Recent Results (from the past 240 hour(s))  SARS CORONAVIRUS 2 (TAT 6-24 HRS) Nasopharyngeal Nasopharyngeal Swab     Status: None   Collection Time: 09/20/20 12:55 PM   Specimen: Nasopharyngeal Swab  Result Value Ref Range Status   SARS Coronavirus 2 NEGATIVE NEGATIVE Final    Comment: (NOTE) SARS-CoV-2 target nucleic acids are NOT DETECTED.  The SARS-CoV-2 RNA is generally detectable in upper and lower respiratory specimens during the acute phase of infection. Negative results do not preclude SARS-CoV-2 infection, do not rule out co-infections with other pathogens, and should not be used as the sole basis for treatment or other patient management decisions. Negative results must be combined with clinical observations, patient history, and epidemiological information. The expected result is Negative.  Fact Sheet for Patients: hSugarRoll.be Fact Sheet for Healthcare Providers: hhttps://www.woods-mathews.com/ This test is not yet approved or cleared by the [place]  and  has been authorized for detection and/or diagnosis of SARS-CoV-2 by FDA under an Emergency Use Authorization (EUA). This EUA will remain  in effect (meaning this test can be used) for the duration of the COVID-19 declaration under Se ction 564(b)(1) of the Act, 21 U.S.C. section 360bbb-3(b)(1), unless the authorization is terminated or revoked sooner.  Performed at [facility] [address], [place] [address]     Labs: Basic Metabolic Panel: Recent Labs  Lab 09/20/20 1037 09/20/20 1239 09/20/20 1444 09/20/20 1746 09/20/20 2205 09/21/20 0210 09/21/20 0348  NA  --    < > 141 140 140 142 139  K  --    < > 3.8 3.6 3.4* 3.8 3.6  CL  --    < > 106 107 107 107 107  CO2  --    < > 17* 19* _0 GLUCOSE  --    < > 291* 210* 203* 128* 97  BUN  --    < > 37* 35* 30* 24* 21  CREATININE  --    < > 1.46* 1.38* 1.24 1.05 0.89  CALCIUM  --    < > 9.3 9.2 9.4 9.4  9.1  MG 1.9  --   --   --   --   --   --    < > = values in this interval not displayed.   Liver Function Tests: Recent Labs  Lab 09/20/20 0903  AST 35  ALT 57*  ALKPHOS 156*  BILITOT 0.3  PROT 8.6*  ALBUMIN 4.3   Recent Labs  Lab 09/20/20 0903  LIPASE 25   No results for input(s): AMMONIA in the last 168 hours. CBC: Recent Labs  Lab 09/20/20 0903  WBC 8.8  HGB 12.0*  HCT 36.3*  MCV 88.8  PLT 312   Cardiac Enzymes: No results for input(s): CKTOTAL, CKMB, CKMBINDEX, TROPONINI in the last 168 hours. BNP: BNP (last 3 results) No results for input(s): BNP in the last 8760 hours.  ProBNP (last 3 results) No results for input(s): PROBNP in the last 8760 hours.  CBG: Recent Labs  Lab 09/21/20 0013 09/21/20 0241 09/21/20 0401 09/21/20 0804 09/21/20 0928  GLUCAP 168* 106* 91 65* 139*       Signed:  Desma Maxim MD.  [facility] 09/21/2020, 9:52 AM

## 2020-09-21 NOTE — Progress Notes (Signed)
Inpatient Diabetes Program Recommendations  AACE/ADA: New Consensus Statement on Inpatient Glycemic Control (2015)  Target Ranges:  Prepandial:   less than 140 mg/dL      Peak postprandial:   less than 180 mg/dL (1-2 hours)      Critically ill patients:  140 - 180 mg/dL   Lab Results  Component Value Date   GLUCAP 139 (H) 09/21/2020   HGBA1C 8.4 (H) 09/20/2020    Review of Glycemic Control  Diabetes history: DM2 Outpatient Diabetes medications: Levemir 35 units QD, glipizide 5 mg QAM Current orders for Inpatient glycemic control: Lantus 20 units Q24H, Novolog 0-9 units TID with meals and 0-5 units HS + 3 units TID with meals  HgbA1C - 8.4% Hypo this am with 65 mg/dL Transitioned off insulin drip to basal/bolus on 2/4 @ 2200.  Inpatient Diabetes Program Recommendations:     Agree with orders. Continue to follow. Will ask RN to review Survival Skills for DM and hypoglycemia s/s and treatment.  Continue to follow.  Thank you. Ailene Ards, RD, LDN, CDE Inpatient Diabetes Coordinator [phone]

## 2020-09-21 NOTE — ED Notes (Signed)
This RN assisted pt with use of urinal. 300 ml of output.

## 2020-09-21 NOTE — ED Notes (Signed)
Rectal tempurature obtained. [place] NP notified. Bair hugger and warm blankets applied to pt.

## 2020-09-21 NOTE — ED Notes (Signed)
CBG 65, pt sat up in bed, eating breakfast and drinking soda.  Will recheck CBG after meal.

## 2020-09-23 ENCOUNTER — Ambulatory Visit: Payer: Self-pay

## 2020-09-23 ENCOUNTER — Encounter: Payer: Self-pay | Admitting: Physical Therapy

## 2020-09-23 NOTE — Telephone Encounter (Signed)
Wife reports pt. Had his insulin today and blood sugar is 41. Pt. Awake and alert. Eating peanut butter crackers. Instructed to give orange juice or soda. Asking wife when he had insulin and what type, not answering triage nurse. Recheck blood sugar is 36. Instructed to call 911 now to come to home. Verbalizes understanding. Reason for Disposition . Sounds like a life-threatening emergency to the triager  Answer Assessment - Initial Assessment Questions 1. SYMPTOMS: "What symptoms are you concerned about?"     Low blood sugar 2. ONSET:  "When did the symptoms start?"     10 minutes 3. BLOOD GLUCOSE: "What is your blood glucose level?"      41 -  36 4. USUAL RANGE: "What is your blood glucose level usually?" (e.g., usual fasting morning value, usual evening value)     100 5. TYPE 1 or 2:  "Do you know what type of diabetes you have?"  (e.g., Type 1, Type 2, Gestational; doesn't know)      Type 1  6. INSULIN: "Do you take insulin?" "What type of insulin(s) do you use? What is the mode of delivery? (syringe, pen; injection or pump) "When did you last give yourself an insulin dose?" (i.e., time or hours/minutes ago) "How much did you give?" (i.e., how many units)     Yes 7. DIABETES PILLS: "Do you take any pills for your diabetes?"     Yes 8. OTHER SYMPTOMS: "Do you have any symptoms?" (e.g., fever, frequent urination, difficulty breathing, vomiting)     Sweating 9. LOW BLOOD GLUCOSE TREATMENT: "What have you done so far to treat the low blood glucose level?"     Peanut butter crackers and candy 10. FOOD: "When did you last eat or drink?"       Eating now 11. ALONE: "Are you alone right now or is someone with you?"        No 12. PREGNANCY: "Is there any chance you are pregnant?" "When was your last menstrual period?"       No  Protocols used: DIABETES - LOW BLOOD SUGAR-A-AH

## 2020-09-23 NOTE — Addendum Note (Signed)
Addended by: Cammie Mcgee on: 09/23/2020 06:18 PM   Modules accepted: Orders

## 2020-09-23 NOTE — Therapy (Signed)
[facility] [address] [name], [place], [address] Phone: [phone]   Fax:  [phone]  Physical Therapy Evaluation  Patient Details  Name: Evan BESKE Sr. MRN: [id number] Date of Birth: 1956/01/27 Referring Provider (PT): Welford Roche MD   Encounter Date: 09/19/2020   PT End of Session - 09/23/20 0746    Visit Number 1    Number of Visits 24    Date for PT Re-Evaluation 12/12/20    Authorization - Visit Number 1    Authorization - Number of Visits 10    PT Start Time 1424    PT Stop Time 1525    PT Time Calculation (min) 61 min    Equipment Utilized During Treatment Gait belt    Activity Tolerance Patient tolerated treatment well    Behavior During Therapy WFL for tasks assessed/performed           Past Medical History:  Diagnosis Date  . Carotid arterial disease (HCC)    a. 08/2018 Carotid U/S: min-mod RICA atherosclerosis w/o hemodynamically significant stenosis. Nl LICA.  Marland Kitchen Chest pain    a. 08/2019 MV: EF 55%. No ischemia. Diaph attenuation noted.  . Diabetes 1.5, managed as type 2 (HCC)   . Diastolic dysfunction    a. 08/2018 Echo: EF 65%. No rwma. Gr1 DD. Mild MR.  . Diastolic dysfunction    a. 08/2019 Echo: EF 55-60%, Gr1 DD. No rwma. Mild MR. RVSP 37.49mmHg.  Marland Kitchen Hypercholesterolemia   . Hypertension   . Left hemiparesis (HCC)    a. Ambulated w/ Cane. Limited use of LUE.  Marland Kitchen PAF (paroxysmal atrial fibrillation) (HCC)    a. 10/2019 Event Monitor: PAF; b. CHA2DS2VASc = 5-->Eliquis.  . Pain in both feet   . Poorly controlled diabetes mellitus (HCC)    a. 04/2019 A1c 13.8.  Marland Kitchen Recurrent strokes (HCC)    a. 10/2016 MRI/A: Acute 39mm R thalamic infarct, ? subacute infarct of R corona radiata; b. 08/2017 MRI/A: Acute 58mm lateral L thalamic infarct. Other more remote lacunar infarcts of thalami bilat. Small vessel dzs; c. 08/2018 MRI/A: Acute lacunar infarct of the post limb of R internal capsule; d. 08/2019 MRI  Acute CVA of L paramedian pons adn R cerebellar hemisphere.  . Tobacco abuse     Past Surgical History:  Procedure Laterality Date  . ESOPHAGOGASTRODUODENOSCOPY (EGD) WITH PROPOFOL N/A 04/26/2019   Procedure: ESOPHAGOGASTRODUODENOSCOPY (EGD) WITH PROPOFOL;  Surgeon: Toney Reil, MD;  Location: [facility] ENDOSCOPY;  Service: Gastroenterology;  Laterality: N/A;    There were no vitals filed for this visit.    Subjective Assessment - 09/23/20 0741    Subjective Pt reports his BP has been doing better since hospitalization in December.  No recent falls.  L shoulder to hand pain (8/10).  HR: 71 bpm, BP: 135/ 77, O2 sat.: 100%.  QC on R.  He states that he has been checking his blood glucose at home and it is running high, which he states is normal for him.  Pt. states he is ready to work on strengthening and improve his walking with PT.  No specific questions or concens at this time.    Pertinent History Pertinent history and PMH: HTN, uncontrolled DM, HLD, R basal ganglia stroke on 08/2018 and L pointine and R cerebellar stroke 09/07/19, sinus bradycardia, peripheral neuropat    Patient Stated Goals Improve strengthening/ walking.  Return to fishing.    Currently in Pain? Yes    Pain Score  8     Pain Location Shoulder    Pain Orientation Left    Pain Onset More than a month ago              [facility] PT Assessment - 09/23/20 0001      Assessment   Medical Diagnosis Hemiplegia and hemiparesis    Referring Provider (PT) Welford Roche MD    Onset Date/Surgical Date 08/18/19    Hand Dominance Right      Precautions   Precautions Fall      Balance Screen   Has the patient fallen in the past 6 months Yes    Has the patient had a decrease in activity level because of a fear of falling?  Yes      Home Environment   Living Environment Private residence    Living Arrangements Spouse/significant other      Prior Function   Level of Independence Requires assistive device for  independence           OBJECTIVE  MUSCULOSKELETAL: Tremor: Absent Bulk: Normal Tone: Noted tone in L bicep and wrist flexors.   Posture Mild, slumped and kyphotic posture with forward head. No significant L shoudler subluxation noted on LUE. Appears fairly symmetrical.   Gait No use of AFO or swedish knee cage for LLE although pt owns them. Decreased BOS overall and step length on RLE due to decreased stance time on LLE from residual weakness. Use of hip hike on L to clear L foot during swing phase due to ant hip weakness with L foot in inversion. Amb with SPC on R.   AROM  L shoulder flexion: 57 deg. L shoulder abduction: 70 deg. L elbow flexion: 117 deg. L elbow extension: -80 deg., PROM: -21 deg. L wrist extension: 14 deg. (fingers flexed), PROM: 70 deg. L wrist flexion: 8 deg., PROM: 48 deg.   Strength L shoulder abduction: 4+/5 MMT L shoulder flexion: limited positioning L biceps: 4+/5 MMT L triceps: 4/5 MMT R grip: 98#, L grip: 36#  R/L 5/4 Hip flexion 5/4 Hip abduction (seated) 5/4+ Hip adduction 5/4- Knee extension 5/3+ Knee flexion 5/3 Ankle Plantarflexion 5/4 Ankle Dorsiflexion 5/3- Great toe extension     NEUROLOGICAL: Mental Status Noted cognitive deficits at baseline from recurrent strokes. Patient's fund of knowledge is within normal limits for educational level.  Cranial Nerves Visual acuity and visual fields are intact  Extraocular muscles are intact. B smooth pursuit and peripheral vision intact.   Sensation Grossly intact to light touch bilateral UEs/LEs as determined by testing dermatomes C2-T2/L2-S2 respectively Proprioception and hot/cold testing deferred on this date   Coordination/Cerebellar Finger to Nose: WNL on RLE. Unable to assess LUE due to AROM deficits Heel to Shin: WNL on RLE. Impaired on LLE Toe tapping: Unable to toe tap feet in sync.   FUNCTIONAL OUTCOME MEASURES     Results Comments  BERG 42/56 Fall risk, in  need of intervention  DGI TBD Falls risk for community amb  5xSTS 13.5 sec.  Impaired LE strength  10 Meter Gait Speed Normal walking speed in m/s = 0.32 m/s Below normative values for full community ambulation      POSTURAL CONTROL TESTS          Modified Clinical Test of Sensory Interaction for Balance (CTSIB):   CONDITION TIME STRATEGY SWAY  Eyes open, firm surface 30 seconds ankle None  Eyes closed, firm surface 30 seconds ankle None  Eyes open, foam surface 30 seconds ankle None  Eyes closed, foam surface 30 seconds ankle AP    FOTO: to be assessed next session       Objective measurements completed on examination: See above findings.     Nustep L2 B UE/LE 10 min. (discussed HEP)-  Pt. Able to maintain L grip strength on handle.    PT Long Term Goals - 09/23/20 0758      PT LONG TERM GOAL #1   Title Pt will increase Berg Balance to 48/56 to demonstrate clinically significant improvement in balance decreasing pt's risk for falls during functional activities.    Baseline 2/3: 42/56    Time 12    Period Weeks    Status New    Target Date 12/12/20      PT LONG TERM GOAL #2   Title Pt will improve 10 m gait speed to > 0.5 m/s  with LRAD to improve safe community ambulation.    Baseline 2/3: 0.32 m/s with SPC and SBA/CGA    Time 12    Period Weeks    Status New    Target Date 12/12/20      PT LONG TERM GOAL #3   Title Pt will improve 5xSTS to < 12 sec with no RUE support to demonstrate clinically significant improvement in BLE strength to improve STS t/f's like standing from the toilet.    Baseline 2/3: 13.5 sec.    Time 12    Period Weeks    Status New    Target Date 12/12/20      PT LONG TERM GOAL #4   Title Pt will improve DGI by a minimum of 5 points to demonstrate clinically significant improvement and safety with community ambulation tasks to decrease risk of falls.    Baseline TBD    Time 12    Period Weeks    Status New    Target Date 12/12/20       PT LONG TERM GOAL #5   Title Pt will improve FOTO score to target amount to demonstrate perceived improvements in ability to complete ADL's.    Baseline 10/20: Perform next session.    Time 12    Period Weeks    Status New    Target Date 12/12/20                  Plan - 09/23/20 0748    Clinical Impression Statement Pt is a pleasant 65 y.o. male referred to PT after recurrent strokes, latest stroke in 2021.  At baseline pt has L hemiplegia in LUE/LE with noted tone in L bicep and shoulder.  Pt. reports 8/10 L shoulder to hand pain at rest.  Significant limitations in strength on LUE/LLE limiting pt's ability to safely stand and amb however RUE/RLE strength grossly 5/5 MMT.   Limited L elbow active movement/ extensionhere pt is lacking 80 deg. active/ 20 deg. passive from terminal extension.  See flowsheet.  Pt scored 5xSTS in 13.5 sec requiring RUE assist placing pt at increased risk of falls for his age.  On Berg balance scale pt scored 42/56 (increase fall risk).  Pt.l demonstrates deficits for safe community ambulation tasks placing pt at risk of falls. Pt's normal walking speed 10 m gait was recorded at 0.32 m/s making pt a limited community ambulator and at risk for falls. Due to these impairments, pt is at a significant increased risk of falls and limited in independently walking and performing ADL's independently. Pt can benefit from skilled PT treatment to address  these impairments so pt can decrease risk of falls and improve overall functional mobility.    Personal Factors and Comorbidities Age;Comorbidity 3+;Education;Finances;Past/Current Experience;Fitness;Transportation    Comorbidities HTN, uncontrolled DM, HLD, R basal ganglia stroke on 08/2018 and L pointine and R cerebellar stroke 09/07/19, sinus bradycardia, peripheral neuropathy    Examination-Activity Limitations Bathing;Bed Mobility;Dressing;Transfers;Squat;Lift;Locomotion Level;Stairs;Reach Overhead;Carry;Stand;Sit     Examination-Participation Restrictions Community Activity;Interpersonal Relationship;Yard Work    Conservation officer, historic buildings Evolving/Moderate complexity    Clinical Decision Making Moderate    Rehab Potential Fair    PT Frequency 2x / week    PT Duration 12 weeks    PT Treatment/Interventions ADLs/Self Care Home Management;Aquatic Therapy;Electrical Stimulation;Moist Heat;DME Instruction;Gait training;Stair training;Functional mobility training;Therapeutic activities;Therapeutic exercise;Balance training;Neuromuscular re-education;Patient/family education;Orthotic Fit/Training;Dry needling;Manual techniques;Canalith Repostioning;Passive range of motion;Biofeedback    PT Next Visit Plan Issue HEP/ balance training.    PT Home Exercise Plan HEP: standing hip marches/abd, ankle DF/PF in sitting/supine, passive L elbow stretch for tone    Consulted and Agree with Plan of Care Patient    Family Member Consulted wife           Patient will benefit from skilled therapeutic intervention in order to improve the following deficits and impairments:  Abnormal gait,Decreased coordination,Decreased range of motion,Difficulty walking,Impaired tone,Decreased endurance,Impaired UE functional use,Decreased activity tolerance,Impaired perceived functional ability,Decreased balance,Impaired flexibility,Decreased cognition,Decreased mobility,Decreased strength,Increased edema,Postural dysfunction,Pain  Visit Diagnosis: Hemiplegia and hemiparesis following cerebral infarction affecting left non-dominant side (HCC)  Difficulty in walking, not elsewhere classified  At risk for falls  Muscle weakness (generalized)  Pain in left arm     Problem List Patient Active Problem List   Diagnosis Date Noted  . DKA (diabetic ketoacidosis) (HCC) 09/20/2020  . PAF (paroxysmal atrial fibrillation) (HCC)   . Abnormal EKG 08/16/2020  . Hypertensive urgency 08/05/2020  . CKD (chronic kidney disease) stage 2,  GFR 60-89 ml/min 06/27/2020  . Monitoring for anticoagulant use 06/27/2020  . Lower extremity edema 05/16/2020  . Paroxysmal atrial fibrillation (HCC) 10/26/2019  . Chest pain of uncertain etiology 09/05/2019  . History of CVA (cerebrovascular accident) 09/05/2019  . Hyperglycemia 09/05/2019  . Anemia 09/05/2019  . AKI (acute kidney injury) (HCC) 09/05/2019  . Atypical pneumonia 09/05/2019  . Coagulopathy (HCC) 06/12/2019  . Gastroesophageal reflux disease   . Hemiplegia and hemiparesis following cerebral infarction affecting left non-dominant side (HCC) 11/25/2018  . 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 RBBB 10/15/2018  . Sinus bradycardia 10/15/2018  . Dysphagia, post-stroke   . Neuropathic pain   . Lacunar infarct, acute (HCC) 09/06/2018  . TIA (transient ischemic attack) 09/01/2018  . Microalbuminuria 07/29/2018  . Left leg pain 01/13/2018  . Hyperlipidemia LDL goal <70 02/11/2017  . Recurrent strokes (HCC) 11/10/2016  . Essential hypertension 11/10/2016  . Type 2 diabetes mellitus with hypoglycemia without coma (HCC) 11/10/2016  . Tobacco abuse 08/28/2013   Cammie Mcgee, PT, DPT # [phone] 09/23/2020, 8:08 AM  [facility] [address] [place], [place], [address] Phone: [phone]   Fax:  [phone]  Name: Evan Ano Sr. MRN: [id number] Date of Birth: 08-02-1956

## 2020-09-23 NOTE — Telephone Encounter (Signed)
Lvm to give the information to them.

## 2020-09-25 ENCOUNTER — Encounter: Payer: Self-pay | Admitting: Family Medicine

## 2020-09-25 DIAGNOSIS — I5189 Other ill-defined heart diseases: Secondary | ICD-10-CM | POA: Insufficient documentation

## 2020-09-25 NOTE — Progress Notes (Signed)
Name: Evan KOMAN Sr.   MRN: [id number]    DOB: June 17, 1956   Date:09/26/2020       Progress Note  [facility] Follow up  HPI  Admission date 09/20/2020 Discharge date: 09/21/2020  Discharge diagnosis: abdominal pain due to DKA, he was treated with insulin drip. His A1C was actually better than it used to be at 8.4 %, used to be above 10 % for months.  CT abdomen negative for acute problems, but has atherosclerosis of aorta and enlarged prostate. CXR showed mild bibasilar atelectasis. He was sent home with advise to take 35 units of Lemiver and glipizide 5 mg, he had one episode of hypoglycemia since he went home, glucose down to 40, wife called 911, gave him candy and drank some soda. Glucose went up to 135. Glucose this am was 90. He has noticed that his food takes a long time to digest, only able to eat small amounts . He denies polyphagia, polydipsia but he has polyuria   He brought his medications, he is missing rx of Spironolactone - given by Dr. Saunders Revel, advised to get it filled at pharmacy   He states he has been feeling well.    Patient Active Problem List   Diagnosis Date Noted  . Diastolic dysfunction   . DKA (diabetic ketoacidosis) ([place]) 09/20/2020  . Abnormal EKG 08/16/2020  . CKD (chronic kidney disease) stage 2, GFR 60-89 ml/min 06/27/2020  . Monitoring for anticoagulant use 06/27/2020  . Lower extremity edema 05/16/2020  . Paroxysmal atrial fibrillation ([place]) 10/26/2019  . Coagulopathy ([place]) 06/12/2019  . Gastroesophageal reflux disease   . Hemiplegia and hemiparesis following cerebral infarction affecting left non-dominant side ([place]) 11/25/2018  . RBBB 10/15/2018  . Dysphagia, post-stroke   . Microalbuminuria 07/29/2018  . Hyperlipidemia LDL goal <70 02/11/2017  . Recurrent strokes ([place]) 11/10/2016  . Essential hypertension 11/10/2016  . Tobacco abuse 08/28/2013  . Peripheral neuropathy 08/28/2013    Past Surgical History:  Procedure  Laterality Date  . ESOPHAGOGASTRODUODENOSCOPY (EGD) WITH PROPOFOL N/A 04/26/2019   Procedure: ESOPHAGOGASTRODUODENOSCOPY (EGD) WITH PROPOFOL;  Surgeon: Lin Landsman, MD;  Location: [facility];  Service: Gastroenterology;  Laterality: N/A;    Family History  Problem Relation Age of Onset  . Hypertension Mother   . Stroke Mother        died @ age 83  . Hypertension Father   . Diabetes Father   . Heart attack Father        died @ 25    Social History   Tobacco Use  . Smoking status: Former Smoker    Packs/day: 1.00    Years: 38.00    Pack years: 38.00    Types: Cigarettes    Start date: 1982  . Smokeless tobacco: Never Used  Substance Use Topics  . Alcohol use: Not Currently    Alcohol/week: 1.0 standard drink    Types: 1 Cans of beer per week    Comment: previously drank but nothing in 1-2 yrs (08/2019).     Current Outpatient Medications:  .  ACCU-CHEK GUIDE test strip, , Disp: , Rfl:  .  Accu-Chek Softclix Lancets lancets, SMARTSIG:Topical, Disp: , Rfl:  .  amLODipine (NORVASC) 10 MG tablet, Take 1 tablet (10 mg total) by mouth daily., Disp: 90 tablet, Rfl: 0 .  apixaban (ELIQUIS) 5 MG TABS tablet, Take 1 tablet (5 mg total) by mouth 2 (two) times daily., Disp: 180 tablet, Rfl: 0 .  atorvastatin (LIPITOR)  40 MG tablet, Take 1 tablet (40 mg total) by mouth daily., Disp: 90 tablet, Rfl: 0 .  baclofen (LIORESAL) 10 MG tablet, TAKE ONE TABLET BY MOUTH THREE TIMES A DAY, Disp: 90 each, Rfl: 2 .  Blood Glucose Monitoring Suppl (FIFTY50 GLUCOSE METER 2.0) w/Device KIT, Use as instructed, Disp: , Rfl:  .  Blood Glucose Monitoring Suppl KIT, Accucheck Guide with lancets and test strips #100 with one refill.  Test blood sugar twice daily. DX Code E11.65 ; Z79.4, Disp: 1 kit, Rfl: 0 .  Continuous Blood Gluc Receiver (FREESTYLE LIBRE 2 READER) DEVI, 1 each by Does not apply route daily., Disp: 1 each, Rfl: 0 .  Continuous Blood Gluc Sensor (FREESTYLE LIBRE 2 SENSOR) MISC, 1  each by Does not apply route every 14 (fourteen) days., Disp: 6 each, Rfl: 1 .  dapagliflozin propanediol (FARXIGA) 10 MG TABS tablet, Take 1 tablet (10 mg total) by mouth daily before breakfast. In place of glipizide, Disp: 90 tablet, Rfl: 1 .  gabapentin (NEURONTIN) 300 MG capsule, Take 2 capsules (600 mg total) by mouth 2 (two) times daily., Disp: 360 capsule, Rfl: 1 .  Glucagon (GVOKE HYPOPEN 1-PACK) 1 MG/0.2ML SOAJ, Inject 1 each into the skin daily as needed., Disp: 0.2 mL, Rfl: 2 .  hydrochlorothiazide (HYDRODIURIL) 25 MG tablet, Take 1 tablet (25 mg total) by mouth daily., Disp: 90 tablet, Rfl: 0 .  insulin detemir (LEVEMIR FLEXTOUCH) 100 UNIT/ML FlexPen, Inject 40 Units into the skin daily., Disp: 15 mL, Rfl: 1 .  Insulin Pen Needle (NOVOFINE) 32G X 6 MM MISC, 1 each by Does not apply route once a week., Disp: 100 each, Rfl: 1 .  lisinopril (ZESTRIL) 40 MG tablet, Take 1 tablet (40 mg total) by mouth daily., Disp: 90 tablet, Rfl: 3 .  Misc. Devices MISC, One pair of Compression stockings  Hemiplegia and hemiparesis following cerebral infarction affecting left non-dominant side (HCC) - Primary Codes: I68.032 Lower extremity edema  Codes: R60.0 Type 2 diabetes mellitus with hyperglycemia, with long-term current use of insulin (HCC)  Codes: E11.65, Z79.4, Disp: 1 Units, Rfl: 0 .  pantoprazole (PROTONIX) 40 MG tablet, TAKE 1 TABLET BY MOUTH DAILY BEFORE BREAKFAST, Disp: 30 tablet, Rfl: 3 .  spironolactone (ALDACTONE) 25 MG tablet, Take 1 tablet (25 mg total) by mouth daily., Disp: 30 tablet, Rfl: 1  No Known Allergies  I personally reviewed active problem list, medication list, allergies, family history, social history, health maintenance with the patient/caregiver today.   ROS  Constitutional: Negative for fever or weight change.  Respiratory: Negative for cough and shortness of breath.   Cardiovascular: Negative for chest pain or palpitations.  Gastrointestinal: Negative for abdominal  pain, no bowel changes. He has indigestion  Musculoskeletal: positive  for gait problem but no joint swelling.  Skin: Negative for rash.   Neurological: Negative for dizziness or headache.  No other specific complaints in a complete review of systems (except as listed in HPI above).  Objective  Vitals:   09/26/20 0826  BP: 140/86  Pulse: 65  Resp: 16  Temp: 98.1 F (36.7 C)  TempSrc: Oral  SpO2: 98%  Weight: 163 lb (73.9 kg)  Height: 5' 5"  (1.651 m)    Body mass index is 27.12 kg/m.  Physical Exam  Constitutional: Patient appears well-developed and well-nourished. Overweight.  No distress.  HEENT: head atraumatic, normocephalic, pupils equal and reactive to light, neck supple Cardiovascular: Normal rate, regular rhythm and normal heart sounds.  No murmur heard.  No BLE edema. Pulmonary/Chest: Effort normal and breath sounds normal. No respiratory distress. Abdominal: Soft.  There is no tenderness. Muscular skeletal: left side hemiparesis, able to move but weaker than right, on wheelchair.  Psychiatric: Patient has a normal mood and affect. behavior is normal. Wife did a lot of talking   Recent Results (from the past 2160 hour(s))  CBC     Status: Abnormal   Collection Time: 08/05/20  2:08 PM  Result Value Ref Range   WBC 4.9 4.0 - 10.5 K/uL   RBC 4.16 (L) 4.22 - 5.81 MIL/uL   Hemoglobin 12.6 (L) 13.0 - 17.0 g/dL   HCT 37.1 (L) 39.0 - 52.0 %   MCV 89.2 80.0 - 100.0 fL   MCH 30.3 26.0 - 34.0 pg   MCHC 34.0 30.0 - 36.0 g/dL   RDW 14.9 11.5 - 15.5 %   Platelets 262 150 - 400 K/uL   nRBC 0.0 0.0 - 0.2 %    Comment: Performed at [facility], [address] [place], [place] [address]  Comprehensive metabolic panel     Status: Abnormal   Collection Time: 08/05/20  2:08 PM  Result Value Ref Range   Sodium 141 135 - 145 mmol/L   Potassium 3.6 3.5 - 5.1 mmol/L   Chloride 106 98 - 111 mmol/L   CO2 25 22 - 32 mmol/L   Glucose, Bld 62 (L) 70 - 99 mg/dL     Comment: Glucose reference range applies only to samples taken after fasting for at least 8 hours.   BUN 17 8 - 23 mg/dL   Creatinine, Ser 0.94 0.61 - 1.24 mg/dL   Calcium 9.3 8.9 - 10.3 mg/dL   Total Protein 7.8 6.5 - 8.1 g/dL   Albumin 3.8 3.5 - 5.0 g/dL   AST 26 15 - 41 U/L   ALT 41 0 - 44 U/L   Alkaline Phosphatase 153 (H) 38 - 126 U/L   Total Bilirubin 0.5 0.3 - 1.2 mg/dL   GFR, Estimated >60 >60 mL/min    Comment: (NOTE) Calculated using the CKD-EPI Creatinine Equation (2021)    Anion gap 10 5 - 15    Comment: Performed at [facility], [address], [place], [place] [address]  Troponin I (High Sensitivity)     Status: None   Collection Time: 08/05/20  2:08 PM  Result Value Ref Range   Troponin I (High Sensitivity) 12 <18 ng/L    Comment: (NOTE) Elevated high sensitivity troponin I (hsTnI) values and significant  changes across serial measurements may suggest ACS but many other  chronic and acute conditions are known to elevate hsTnI results.  Refer to the "Links" section for chest pain algorithms and additional  guidance. Performed at [facility], [place], [place] [address]   Troponin I (High Sensitivity)     Status: None   Collection Time: 08/05/20  4:16 PM  Result Value Ref Range   Troponin I (High Sensitivity) 9 <18 ng/L    Comment: (NOTE) Elevated high sensitivity troponin I (hsTnI) values and significant  changes across serial measurements may suggest ACS but many other  chronic and acute conditions are known to elevate hsTnI results.  Refer to the "Links" section for chest pain algorithms and additional  guidance. Performed at [facility], [address] [place], [place] [address]   Resp Panel by RT-PCR (Flu A&B, Covid) Nasopharyngeal Swab     Status: None   Collection Time: 08/05/20  7:24 PM   Specimen: Nasopharyngeal Swab; Nasopharyngeal(NP) swabs in vial transport medium  Result Value Ref Range   SARS  Coronavirus 2 by RT PCR NEGATIVE NEGATIVE    Comment: (NOTE) SARS-CoV-2 target nucleic acids are NOT DETECTED.  The SARS-CoV-2 RNA is generally detectable in upper respiratory specimens during the acute phase of infection. The lowest concentration of SARS-CoV-2 viral copies this assay can detect is 138 copies/mL. A negative result does not preclude SARS-Cov-2 infection and should not be used as the sole basis for treatment or other patient management decisions. A negative result may occur with  improper specimen collection/handling, submission of specimen other than nasopharyngeal swab, presence of viral mutation(s) within the areas targeted by this assay, and inadequate number of viral copies(<138 copies/mL). A negative result must be combined with clinical observations, patient history, and epidemiological information. The expected result is Negative.  Fact Sheet for Patients:  EntrepreneurPulse.com.au  Fact Sheet for Healthcare Providers:  IncredibleEmployment.be  This test is no t yet approved or cleared by the [place] FDA and  has been authorized for detection and/or diagnosis of SARS-CoV-2 by FDA under an Emergency Use Authorization (EUA). This EUA will remain  in effect (meaning this test can be used) for the duration of the COVID-19 declaration under Section 564(b)(1) of the Act, 21 U.S.C.section 360bbb-3(b)(1), unless the authorization is terminated  or revoked sooner.       Influenza A by PCR NEGATIVE NEGATIVE   Influenza B by PCR NEGATIVE NEGATIVE    Comment: (NOTE) The Xpert Xpress SARS-CoV-2/FLU/RSV plus assay is intended as an aid in the diagnosis of influenza from Nasopharyngeal swab specimens and should not be used as a sole basis for treatment. Nasal washings and aspirates are unacceptable for Xpert Xpress SARS-CoV-2/FLU/RSV testing.  Fact Sheet for Patients: EntrepreneurPulse.com.au  Fact Sheet  for Healthcare Providers: IncredibleEmployment.be  This test is not yet approved or cleared by the [place] FDA and has been authorized for detection and/or diagnosis of SARS-CoV-2 by FDA under an Emergency Use Authorization (EUA). This EUA will remain in effect (meaning this test can be used) for the duration of the COVID-19 declaration under Section 564(b)(1) of the Act, 21 U.S.C. section 360bbb-3(b)(1), unless the authorization is terminated or revoked.  Performed at [facility], [address] [place], [place] [address]   CBG monitoring, ED     Status: None   Collection Time: 08/05/20  8:59 PM  Result Value Ref Range   Glucose-Capillary 76 70 - 99 mg/dL    Comment: Glucose reference range applies only to samples taken after fasting for at least 8 hours.  CBG monitoring, ED     Status: None   Collection Time: 08/05/20 10:45 PM  Result Value Ref Range   Glucose-Capillary 80 70 - 99 mg/dL    Comment: Glucose reference range applies only to samples taken after fasting for at least 8 hours.  Basic metabolic panel     Status: Abnormal   Collection Time: 08/06/20  6:29 AM  Result Value Ref Range   Sodium 140 135 - 145 mmol/L   Potassium 3.5 3.5 - 5.1 mmol/L   Chloride 106 98 - 111 mmol/L   CO2 25 22 - 32 mmol/L   Glucose, Bld 139 (H) 70 - 99 mg/dL    Comment: Glucose reference range applies only to samples taken after fasting for at least 8 hours.   BUN 16 8 - 23 mg/dL   Creatinine, Ser 0.96 0.61 - 1.24 mg/dL  Calcium 9.4 8.9 - 10.3 mg/dL   GFR, Estimated >60 >60 mL/min    Comment: (NOTE) Calculated using the CKD-EPI Creatinine Equation (2021)    Anion gap 9 5 - 15    Comment: Performed at [facility], [address] [place], [place] [address]  CBC     Status: Abnormal   Collection Time: 08/06/20  6:29 AM  Result Value Ref Range   WBC 5.0 4.0 - 10.5 K/uL   RBC 4.06 (L) 4.22 - 5.81 MIL/uL   Hemoglobin 11.8 (L) 13.0 - 17.0 g/dL    HCT 35.8 (L) 39.0 - 52.0 %   MCV 88.2 80.0 - 100.0 fL   MCH 29.1 26.0 - 34.0 pg   MCHC 33.0 30.0 - 36.0 g/dL   RDW 14.8 11.5 - 15.5 %   Platelets 254 150 - 400 K/uL   nRBC 0.0 0.0 - 0.2 %    Comment: Performed at [facility], [address] [place], [place] [address]  Glucose, capillary     Status: Abnormal   Collection Time: 08/06/20  7:41 AM  Result Value Ref Range   Glucose-Capillary 117 (H) 70 - 99 mg/dL    Comment: Glucose reference range applies only to samples taken after fasting for at least 8 hours.   Comment 1 Notify RN    Comment 2 Document in Chart   Urinalysis, Complete w Microscopic     Status: Abnormal   Collection Time: 09/20/20  9:02 AM  Result Value Ref Range   Color, Urine STRAW (A) YELLOW   APPearance CLEAR (A) CLEAR   Specific Gravity, Urine 1.018 1.005 - 1.030   pH 5.0 5.0 - 8.0   Glucose, UA >=500 (A) NEGATIVE mg/dL   Hgb urine dipstick SMALL (A) NEGATIVE   Bilirubin Urine NEGATIVE NEGATIVE   Ketones, ur NEGATIVE NEGATIVE mg/dL   Protein, ur 100 (A) NEGATIVE mg/dL   Nitrite NEGATIVE NEGATIVE   Leukocytes,Ua NEGATIVE NEGATIVE   RBC / HPF 0-5 0 - 5 RBC/hpf   WBC, UA 0-5 0 - 5 WBC/hpf   Bacteria, UA NONE SEEN NONE SEEN   Squamous Epithelial / LPF NONE SEEN 0 - 5    Comment: Performed at [facility], [address] [place], [place] [address]  Lipase, blood     Status: None   Collection Time: 09/20/20  9:03 AM  Result Value Ref Range   Lipase 25 11 - 51 U/L    Comment: Performed at [facility], [address] [place], [place] [address]  Comprehensive metabolic panel     Status: Abnormal   Collection Time: 09/20/20  9:03 AM  Result Value Ref Range   Sodium 137 135 - 145 mmol/L   Potassium 3.7 3.5 - 5.1 mmol/L   Chloride 103 98 - 111 mmol/L   CO2 18 (L) 22 - 32 mmol/L   Glucose, Bld 372 (H) 70 - 99 mg/dL    Comment: Glucose reference range applies only to samples taken after fasting for at least 8 hours.   BUN 42  (H) 8 - 23 mg/dL   Creatinine, Ser 1.66 (H) 0.61 - 1.24 mg/dL   Calcium 9.4 8.9 - 10.3 mg/dL   Total Protein 8.6 (H) 6.5 - 8.1 g/dL   Albumin 4.3 3.5 - 5.0 g/dL   AST 35 15 - 41 U/L   ALT 57 (H) 0 - 44 U/L   Alkaline Phosphatase 156 (H) 38 - 126 U/L   Total Bilirubin 0.3 0.3 -  1.2 mg/dL   GFR, Estimated 46 (L) >60 mL/min    Comment: (NOTE) Calculated using the CKD-EPI Creatinine Equation (2021)    Anion gap 16 (H) 5 - 15    Comment: Performed at [facility], [address] [place], [place] [address]  CBC     Status: Abnormal   Collection Time: 09/20/20  9:03 AM  Result Value Ref Range   WBC 8.8 4.0 - 10.5 K/uL   RBC 4.09 (L) 4.22 - 5.81 MIL/uL   Hemoglobin 12.0 (L) 13.0 - 17.0 g/dL   HCT 36.3 (L) 39.0 - 52.0 %   MCV 88.8 80.0 - 100.0 fL   MCH 29.3 26.0 - 34.0 pg   MCHC 33.1 30.0 - 36.0 g/dL   RDW 15.1 11.5 - 15.5 %   Platelets 312 150 - 400 K/uL   nRBC 0.0 0.0 - 0.2 %    Comment: Performed at [facility], [address] [place], [place] [address]  Blood gas, venous     Status: Abnormal   Collection Time: 09/20/20 10:13 AM  Result Value Ref Range   pH, Ven 7.29 7.250 - 7.430   pCO2, Ven 41 (L) 44.0 - 60.0 mmHg   pO2, Ven 66.0 (H) 32.0 - 45.0 mmHg   Bicarbonate 19.7 (L) 20.0 - 28.0 mmol/L   Acid-base deficit 6.5 (H) 0.0 - 2.0 mmol/L   O2 Saturation 90.1 %   Patient temperature 37.0    Collection site VENOUS    Sample type VENOUS     Comment: Performed at [facility], [address], [place], [place] [address]  Beta-hydroxybutyric acid     Status: None   Collection Time: 09/20/20 10:18 AM  Result Value Ref Range   Beta-Hydroxybutyric Acid 0.18 0.05 - 0.27 mmol/L    Comment: Performed at [facility], [place], [place] [address]  Troponin I (High Sensitivity)     Status: None   Collection Time: 09/20/20 10:18 AM  Result Value Ref Range   Troponin I (High Sensitivity) 8 <18 ng/L    Comment: (NOTE) Elevated high  sensitivity troponin I (hsTnI) values and significant  changes across serial measurements may suggest ACS but many other  chronic and acute conditions are known to elevate hsTnI results.  Refer to the "Links" section for chest pain algorithms and additional  guidance. Performed at [facility], [address] [place], [place] [address]   Hemoglobin A1c     Status: Abnormal   Collection Time: 09/20/20 10:18 AM  Result Value Ref Range   Hgb A1c MFr Bld 8.4 (H) 4.8 - 5.6 %    Comment: (NOTE) Pre diabetes:          5.7%-6.4%  Diabetes:              >6.4%  Glycemic control for   <7.0% adults with diabetes    Mean Plasma Glucose 194.38 mg/dL    Comment: Performed at [facility] [address] [place], [place] [address]  Urine Drug Screen, Qualitative ([facility] only)     Status: None   Collection Time: 09/20/20 10:18 AM  Result Value Ref Range   Tricyclic, Ur Screen NONE DETECTED NONE DETECTED   Amphetamines, Ur Screen NONE DETECTED NONE DETECTED   MDMA (Ecstasy)Ur Screen NONE DETECTED NONE DETECTED   Cocaine Metabolite,Ur [place] NONE DETECTED NONE DETECTED   Opiate, Ur Screen NONE DETECTED NONE DETECTED   Phencyclidine (PCP) Ur S NONE DETECTED NONE DETECTED  Cannabinoid 50 Ng, Ur [place] NONE DETECTED NONE DETECTED   Barbiturates, Ur Screen NONE DETECTED NONE DETECTED   Benzodiazepine, Ur Scrn NONE DETECTED NONE DETECTED   Methadone Scn, Ur NONE DETECTED NONE DETECTED    Comment: (NOTE) Tricyclics + metabolites, urine    Cutoff 1000 ng/mL Amphetamines + metabolites, urine  Cutoff 1000 ng/mL MDMA (Ecstasy), urine              Cutoff 500 ng/mL Cocaine Metabolite, urine          Cutoff 300 ng/mL Opiate + metabolites, urine        Cutoff 300 ng/mL Phencyclidine (PCP), urine         Cutoff 25 ng/mL Cannabinoid, urine                 Cutoff 50 ng/mL Barbiturates + metabolites, urine  Cutoff 200 ng/mL Benzodiazepine, urine              Cutoff 200 ng/mL Methadone, urine                    Cutoff 300 ng/mL  The urine drug screen provides only a preliminary, unconfirmed analytical test result and should not be used for non-medical purposes. Clinical consideration and professional judgment should be applied to any positive drug screen result due to possible interfering substances. A more specific alternate chemical method must be used in order to obtain a confirmed analytical result. Gas chromatography / mass spectrometry (GC/MS) is the preferred confirm atory method. Performed at [facility], [address] [place], [place] [address]   CBG monitoring, ED     Status: Abnormal   Collection Time: 09/20/20 10:30 AM  Result Value Ref Range   Glucose-Capillary 363 (H) 70 - 99 mg/dL    Comment: Glucose reference range applies only to samples taken after fasting for at least 8 hours.  Magnesium     Status: None   Collection Time: 09/20/20 10:37 AM  Result Value Ref Range   Magnesium 1.9 1.7 - 2.4 mg/dL    Comment: Performed at [facility], [address] [place], [place] [address]  POC CBG, ED     Status: Abnormal   Collection Time: 09/20/20 11:33 AM  Result Value Ref Range   Glucose-Capillary 310 (H) 70 - 99 mg/dL    Comment: Glucose reference range applies only to samples taken after fasting for at least 8 hours.  Basic metabolic panel     Status: Abnormal   Collection Time: 09/20/20 12:39 PM  Result Value Ref Range   Sodium 135 135 - 145 mmol/L   Potassium 3.5 3.5 - 5.1 mmol/L   Chloride 101 98 - 111 mmol/L   CO2 18 (L) 22 - 32 mmol/L   Glucose, Bld 324 (H) 70 - 99 mg/dL    Comment: Glucose reference range applies only to samples taken after fasting for at least 8 hours.   BUN 40 (H) 8 - 23 mg/dL   Creatinine, Ser 1.58 (H) 0.61 - 1.24 mg/dL   Calcium 8.9 8.9 - 10.3 mg/dL   GFR, Estimated 49 (L) >60 mL/min    Comment: (NOTE) Calculated using the CKD-EPI Creatinine Equation (2021)    Anion gap 16 (H) 5 - 15    Comment: Performed at  [facility], [address] [place], [place] [address]  Troponin I (High Sensitivity)     Status: None   Collection Time: 09/20/20 12:39 PM  Result Value Ref Range  Troponin I (High Sensitivity) 9 <18 ng/L    Comment: (NOTE) Elevated high sensitivity troponin I (hsTnI) values and significant  changes across serial measurements may suggest ACS but many other  chronic and acute conditions are known to elevate hsTnI results.  Refer to the "Links" section for chest pain algorithms and additional  guidance. Performed at [facility], [address] [place], [place] [address]   POC CBG, ED     Status: Abnormal   Collection Time: 09/20/20 12:47 PM  Result Value Ref Range   Glucose-Capillary 304 (H) 70 - 99 mg/dL    Comment: Glucose reference range applies only to samples taken after fasting for at least 8 hours.  SARS CORONAVIRUS 2 (TAT 6-24 HRS) Nasopharyngeal Nasopharyngeal Swab     Status: None   Collection Time: 09/20/20 12:55 PM   Specimen: Nasopharyngeal Swab  Result Value Ref Range   SARS Coronavirus 2 NEGATIVE NEGATIVE    Comment: (NOTE) SARS-CoV-2 target nucleic acids are NOT DETECTED.  The SARS-CoV-2 RNA is generally detectable in upper and lower respiratory specimens during the acute phase of infection. Negative results do not preclude SARS-CoV-2 infection, do not rule out co-infections with other pathogens, and should not be used as the sole basis for treatment or other patient management decisions. Negative results must be combined with clinical observations, patient history, and epidemiological information. The expected result is Negative.  Fact Sheet for Patients: SugarRoll.be  Fact Sheet for Healthcare Providers: https://www.woods-mathews.com/  This test is not yet approved or cleared by the [place] FDA and  has been authorized for detection and/or diagnosis of SARS-CoV-2 by FDA under an  Emergency Use Authorization (EUA). This EUA will remain  in effect (meaning this test can be used) for the duration of the COVID-19 declaration under Se ction 564(b)(1) of the Act, 21 U.S.C. section 360bbb-3(b)(1), unless the authorization is terminated or revoked sooner.  Performed at [facility], [place] [address] [place], [place] [address]   POC CBG, ED     Status: Abnormal   Collection Time: 09/20/20  1:48 PM  Result Value Ref Range   Glucose-Capillary 288 (H) 70 - 99 mg/dL    Comment: Glucose reference range applies only to samples taken after fasting for at least 8 hours.  HIV Antibody (routine testing w rflx)     Status: None   Collection Time: 09/20/20  2:44 PM  Result Value Ref Range   HIV Screen 4th Generation wRfx Non Reactive Non Reactive    Comment: Performed at [facility], [place] [address], [place], [place] [address]  Basic metabolic panel     Status: Abnormal   Collection Time: 09/20/20  2:44 PM  Result Value Ref Range   Sodium 141 135 - 145 mmol/L   Potassium 3.8 3.5 - 5.1 mmol/L   Chloride 106 98 - 111 mmol/L   CO2 17 (L) 22 - 32 mmol/L   Glucose, Bld 291 (H) 70 - 99 mg/dL    Comment: Glucose reference range applies only to samples taken after fasting for at least 8 hours.   BUN 37 (H) 8 - 23 mg/dL   Creatinine, Ser 1.46 (H) 0.61 - 1.24 mg/dL   Calcium 9.3 8.9 - 10.3 mg/dL   GFR, Estimated 54 (L) >60 mL/min    Comment: (NOTE) Calculated using the CKD-EPI Creatinine Equation (2021)    Anion gap 18 (H) 5 - 15    Comment: Performed at [facility], [address] [place], [place] [address]  Beta-hydroxybutyric acid     Status: Abnormal   Collection Time: 09/20/20  2:44 PM  Result Value Ref Range   Beta-Hydroxybutyric Acid <0.05 (L) 0.05 - 0.27 mmol/L    Comment: Performed at [facility] [address], [address] [place], [place] [address]  POC CBG, ED     Status: Abnormal   Collection Time: 09/20/20  2:51 PM  Result Value Ref Range    Glucose-Capillary 256 (H) 70 - 99 mg/dL    Comment: Glucose reference range applies only to samples taken after fasting for at least 8 hours.  CBG monitoring, ED     Status: Abnormal   Collection Time: 09/20/20  3:34 PM  Result Value Ref Range   Glucose-Capillary 221 (H) 70 - 99 mg/dL    Comment: Glucose reference range applies only to samples taken after fasting for at least 8 hours.  CBG monitoring, ED     Status: Abnormal   Collection Time: 09/20/20  4:51 PM  Result Value Ref Range   Glucose-Capillary 224 (H) 70 - 99 mg/dL    Comment: Glucose reference range applies only to samples taken after fasting for at least 8 hours.  Basic metabolic panel     Status: Abnormal   Collection Time: 09/20/20  5:46 PM  Result Value Ref Range   Sodium 140 135 - 145 mmol/L   Potassium 3.6 3.5 - 5.1 mmol/L   Chloride 107 98 - 111 mmol/L   CO2 19 (L) 22 - 32 mmol/L   Glucose, Bld 210 (H) 70 - 99 mg/dL    Comment: Glucose reference range applies only to samples taken after fasting for at least 8 hours.   BUN 35 (H) 8 - 23 mg/dL   Creatinine, Ser 1.38 (H) 0.61 - 1.24 mg/dL   Calcium 9.2 8.9 - 10.3 mg/dL   GFR, Estimated 57 (L) >60 mL/min    Comment: (NOTE) Calculated using the CKD-EPI Creatinine Equation (2021)    Anion gap 14 5 - 15    Comment: Performed at [facility], [address] [place], [place] [address]  CBG monitoring, ED     Status: Abnormal   Collection Time: 09/20/20  5:48 PM  Result Value Ref Range   Glucose-Capillary 191 (H) 70 - 99 mg/dL    Comment: Glucose reference range applies only to samples taken after fasting for at least 8 hours.  CBG monitoring, ED     Status: Abnormal   Collection Time: 09/20/20  6:41 PM  Result Value Ref Range   Glucose-Capillary 202 (H) 70 - 99 mg/dL    Comment: Glucose reference range applies only to samples taken after fasting for at least 8 hours.  CBG monitoring, ED     Status: Abnormal   Collection Time: 09/20/20  7:48 PM  Result  Value Ref Range   Glucose-Capillary 182 (H) 70 - 99 mg/dL    Comment: Glucose reference range applies only to samples taken after fasting for at least 8 hours.  CBG monitoring, ED     Status: Abnormal   Collection Time: 09/20/20  9:03 PM  Result Value Ref Range   Glucose-Capillary 187 (H) 70 - 99 mg/dL    Comment: Glucose reference range applies only to samples taken after fasting for at least 8 hours.  CBG monitoring, ED     Status: Abnormal   Collection Time: 09/20/20  9:58 PM  Result Value Ref Range   Glucose-Capillary 184 (H) 70 - 99 mg/dL  Comment: Glucose reference range applies only to samples taken after fasting for at least 8 hours.  Basic metabolic panel     Status: Abnormal   Collection Time: 09/20/20 10:05 PM  Result Value Ref Range   Sodium 140 135 - 145 mmol/L   Potassium 3.4 (L) 3.5 - 5.1 mmol/L   Chloride 107 98 - 111 mmol/L   CO2 22 22 - 32 mmol/L   Glucose, Bld 203 (H) 70 - 99 mg/dL    Comment: Glucose reference range applies only to samples taken after fasting for at least 8 hours.   BUN 30 (H) 8 - 23 mg/dL   Creatinine, Ser 1.24 0.61 - 1.24 mg/dL   Calcium 9.4 8.9 - 10.3 mg/dL   GFR, Estimated >60 >60 mL/min    Comment: (NOTE) Calculated using the CKD-EPI Creatinine Equation (2021)    Anion gap 11 5 - 15    Comment: Performed at [facility], [address] [place], [place] [address]  Beta-hydroxybutyric acid     Status: Abnormal   Collection Time: 09/20/20 10:05 PM  Result Value Ref Range   Beta-Hydroxybutyric Acid <0.05 (L) 0.05 - 0.27 mmol/L    Comment: Performed at [facility], [address] [place], [place] [address]  CBG monitoring, ED     Status: Abnormal   Collection Time: 09/20/20 11:46 PM  Result Value Ref Range   Glucose-Capillary 143 (H) 70 - 99 mg/dL    Comment: Glucose reference range applies only to samples taken after fasting for at least 8 hours.  CBG monitoring, ED     Status: Abnormal   Collection Time:  09/21/20 12:13 AM  Result Value Ref Range   Glucose-Capillary 168 (H) 70 - 99 mg/dL    Comment: Glucose reference range applies only to samples taken after fasting for at least 8 hours.  Basic metabolic panel     Status: Abnormal   Collection Time: 09/21/20  2:10 AM  Result Value Ref Range   Sodium 142 135 - 145 mmol/L   Potassium 3.8 3.5 - 5.1 mmol/L   Chloride 107 98 - 111 mmol/L   CO2 24 22 - 32 mmol/L   Glucose, Bld 128 (H) 70 - 99 mg/dL    Comment: Glucose reference range applies only to samples taken after fasting for at least 8 hours.   BUN 24 (H) 8 - 23 mg/dL   Creatinine, Ser 1.05 0.61 - 1.24 mg/dL   Calcium 9.4 8.9 - 10.3 mg/dL   GFR, Estimated >60 >60 mL/min    Comment: (NOTE) Calculated using the CKD-EPI Creatinine Equation (2021)    Anion gap 11 5 - 15    Comment: Performed at [facility], [address] [place], [place] [address]  CBG monitoring, ED     Status: Abnormal   Collection Time: 09/21/20  2:41 AM  Result Value Ref Range   Glucose-Capillary 106 (H) 70 - 99 mg/dL    Comment: Glucose reference range applies only to samples taken after fasting for at least 8 hours.  Beta-hydroxybutyric acid     Status: None   Collection Time: 09/21/20  3:48 AM  Result Value Ref Range   Beta-Hydroxybutyric Acid 0.06 0.05 - 0.27 mmol/L    Comment: Performed at [facility], [address], [place], [place] [address]  Basic metabolic panel     Status: None   Collection Time: 09/21/20  3:48 AM  Result Value Ref Range   Sodium 139 135 - 145  mmol/L   Potassium 3.6 3.5 - 5.1 mmol/L   Chloride 107 98 - 111 mmol/L   CO2 23 22 - 32 mmol/L   Glucose, Bld 97 70 - 99 mg/dL    Comment: Glucose reference range applies only to samples taken after fasting for at least 8 hours.   BUN 21 8 - 23 mg/dL   Creatinine, Ser 0.89 0.61 - 1.24 mg/dL   Calcium 9.1 8.9 - 10.3 mg/dL   GFR, Estimated >60 >60 mL/min    Comment: (NOTE) Calculated using the CKD-EPI Creatinine  Equation (2021)    Anion gap 9 5 - 15    Comment: Performed at [facility]: [address] [place], [place] [address]  CBG monitoring, ED     Status: None   Collection Time: 09/21/20  4:01 AM  Result Value Ref Range   Glucose-Capillary 91 70 - 99 mg/dL    Comment: Glucose reference range applies only to samples taken after fasting for at least 8 hours.  CBG monitoring, ED     Status: Abnormal   Collection Time: 09/21/20  8:04 AM  Result Value Ref Range   Glucose-Capillary 65 (L) 70 - 99 mg/dL    Comment: Glucose reference range applies only to samples taken after fasting for at least 8 hours.  CBG monitoring, ED     Status: Abnormal   Collection Time: 09/21/20  9:28 AM  Result Value Ref Range   Glucose-Capillary 139 (H) 70 - 99 mg/dL    Comment: Glucose reference range applies only to samples taken after fasting for at least 8 hours.    PHQ2/9: Depression screen [facility] 2/9 09/26/2020 08/27/2020 06/27/2020 05/16/2020 03/08/2020  Decreased Interest 0 0 0 0 0  Down, Depressed, Hopeless 0 0 0 0 0  PHQ - 2 Score 0 0 0 0 0  Altered sleeping - - - - 1  Tired, decreased energy - - - - 0  Change in appetite - - - - 0  Feeling bad or failure about yourself  - - - - 0  Trouble concentrating - - - - 0  Moving slowly or fidgety/restless - - - - 0  Suicidal thoughts - - - - 0  PHQ-9 Score - - - - 1  Difficult doing work/chores - - - - Somewhat difficult  Some recent data might be hidden    phq 9 is negative   Fall Risk: Fall Risk  09/26/2020 08/27/2020 06/27/2020 05/16/2020 03/08/2020  Falls in the past year? 0 0 1 1 0  Number falls in past yr: 0 0 1 1 0  Injury with Fall? 0 0 0 1 0  Follow up - - - Falls evaluation completed -     Functional Status Survey: Is the patient deaf or have difficulty hearing?: Yes Does the patient have difficulty seeing, even when wearing glasses/contacts?: Yes Does the patient have difficulty concentrating, remembering, or making decisions?:  No Does the patient have difficulty walking or climbing stairs?: Yes Does the patient have difficulty dressing or bathing?: Yes Does the patient have difficulty doing errands alone such as visiting a doctor's office or shopping?: Yes    Assessment & Plan  1. Type 2 diabetes mellitus with other circulatory complication, with long-term current use of insulin (HCC)  - dapagliflozin propanediol (FARXIGA) 10 MG TABS tablet; Take 1 tablet (10 mg total) by mouth daily before breakfast. In place of glipizide  Dispense: 90 tablet; Refill: 1 - Continuous Blood Gluc Receiver (FREESTYLE LIBRE  2 READER) DEVI; 1 each by Does not apply route daily.  Dispense: 1 each; Refill: 0 - Continuous Blood Gluc Sensor (FREESTYLE LIBRE 2 SENSOR) MISC; 1 each by Does not apply route every 14 (fourteen) days.  Dispense: 6 each; Refill: 1 - Glucagon (GVOKE HYPOPEN 1-PACK) 1 MG/0.2ML SOAJ; Inject 1 each into the skin daily as needed.  Dispense: 0.2 mL; Refill: 2  2. Hospital discharge follow-up   3. Diabetes mellitus with gastroparesis (HCC)  - metoCLOPramide (REGLAN) 5 MG tablet; Take 1 tablet (5 mg total) by mouth 3 (three) times daily before meals.  Dispense: 90 tablet; Refill: 0  4. Need for shingles vaccine  shingrix #1 today

## 2020-09-26 ENCOUNTER — Other Ambulatory Visit: Payer: Self-pay

## 2020-09-26 ENCOUNTER — Ambulatory Visit: Payer: Medicaid Other | Admitting: Physical Therapy

## 2020-09-26 ENCOUNTER — Encounter: Payer: Self-pay | Admitting: Family Medicine

## 2020-09-26 ENCOUNTER — Ambulatory Visit: Payer: Medicaid Other | Admitting: Family Medicine

## 2020-09-26 VITALS — BP 140/86 | HR 65 | Temp 98.1°F | Resp 16 | Ht 65.0 in | Wt 163.0 lb

## 2020-09-26 DIAGNOSIS — R262 Difficulty in walking, not elsewhere classified: Secondary | ICD-10-CM | POA: Diagnosis not present

## 2020-09-26 DIAGNOSIS — E1143 Type 2 diabetes mellitus with diabetic autonomic (poly)neuropathy: Secondary | ICD-10-CM | POA: Diagnosis not present

## 2020-09-26 DIAGNOSIS — Z794 Long term (current) use of insulin: Secondary | ICD-10-CM

## 2020-09-26 DIAGNOSIS — M79602 Pain in left arm: Secondary | ICD-10-CM | POA: Diagnosis not present

## 2020-09-26 DIAGNOSIS — I69354 Hemiplegia and hemiparesis following cerebral infarction affecting left non-dominant side: Secondary | ICD-10-CM

## 2020-09-26 DIAGNOSIS — Z9181 History of falling: Secondary | ICD-10-CM | POA: Diagnosis not present

## 2020-09-26 DIAGNOSIS — Z23 Encounter for immunization: Secondary | ICD-10-CM

## 2020-09-26 DIAGNOSIS — E1159 Type 2 diabetes mellitus with other circulatory complications: Secondary | ICD-10-CM | POA: Diagnosis not present

## 2020-09-26 DIAGNOSIS — Z09 Encounter for follow-up examination after completed treatment for conditions other than malignant neoplasm: Secondary | ICD-10-CM

## 2020-09-26 DIAGNOSIS — R2681 Unsteadiness on feet: Secondary | ICD-10-CM | POA: Diagnosis not present

## 2020-09-26 DIAGNOSIS — M6281 Muscle weakness (generalized): Secondary | ICD-10-CM | POA: Diagnosis not present

## 2020-09-26 DIAGNOSIS — I5189 Other ill-defined heart diseases: Secondary | ICD-10-CM

## 2020-09-26 MED ORDER — FREESTYLE LIBRE 2 SENSOR MISC
1.0000 | 1 refills | Status: DC
Start: 2020-09-26 — End: 2021-06-20

## 2020-09-26 MED ORDER — GVOKE HYPOPEN 1-PACK 1 MG/0.2ML ~~LOC~~ SOAJ: 1.0000 | Freq: Every day | SUBCUTANEOUS | 2 refills | Status: DC | PRN

## 2020-09-26 MED ORDER — DAPAGLIFLOZIN PROPANEDIOL 10 MG PO TABS: 10.0000 mg | ORAL_TABLET | Freq: Every day | ORAL | 1 refills | Status: DC

## 2020-09-26 MED ORDER — METOCLOPRAMIDE HCL 5 MG PO TABS: 5.0000 mg | ORAL_TABLET | Freq: Three times a day (TID) | ORAL | 0 refills | Status: DC

## 2020-09-26 MED ORDER — FREESTYLE LIBRE 2 READER DEVI: 1.0000 | Freq: Every day | 0 refills | Status: DC

## 2020-09-28 ENCOUNTER — Encounter: Payer: Self-pay | Admitting: Physical Therapy

## 2020-09-28 NOTE — Progress Notes (Signed)
Cardiology Office Note    Date:  10/03/2020   ID:  Evan Cones Sr., DOB [birth year], MRN [id number]  PCP:  Myles Gip, DO  Cardiologist:  Nelva Bush, MD  Electrophysiologist:  None   Chief Complaint: Follow up  History of Present Illness:   Evan MACBRIDE Sr. is a 65 y.o. male with history of recurrent strokes, left hemiparesis, PAF diagnosed in 10/2019 on Eliquis, diastolic dysfunction, HTN, HLD, poorly controlled diabetes, cerebrovascular and carotid disease, and tobacco use who presents for follow up of HTN.   Echo in 2018, during admission for CVA, showed an EF of 55-65% with mild LVH. Carotid artery ultrasound in 08/2017 showed less than 50% bilateral ICA stenoses. During admission for TIA in 08/2018, echo showed an EF of 65%, mild concentric LVH, no RWMA, Gr1DD, and mild MR. Carotid ultrasound in 08/2018 showed minimal to moderate RICA atherosclerotic plaque not resulting in hemodynamically significant stenosis with normal LICA.   He was admitted to the hospital in 08/2019 with hyperglycemia. Echo showed an EF of 55-60%, borderline LVH, Gr1DD, no RWMA, low normal RVSF with normal ventricular cavity size, degenerative mitral valve with mild regurgitation, and a mildly elevated PASP at 37.6 mmHg. Stress testing was nonischemic. One day after discharge, he presented to [facility] ED with dysarthria, left facial droop, and worsening left-sided weakness. MRI showed acute stroke in the left paramedian pons and right cerebellar hemisphere. Subsequent outpatient cardiac monitoring showed PAF, as outlined below, leading him to be placed on Eliquis. He was most recently admitted to the hospital in 07/2020 with hypertensive urgency. He was last seen in the office on 08/15/2020 for follow up and was doing well. His BP remained elevated at 160/90. He reported adherence to his medications. He was started on spironolactone 25 mg with continuation of prior antihypertensives.  He was recently admitted in  early 09/2020 with DKA and AKI with symptoms improving with adjustment in his diabetic medications and IV fluids.  He comes in today accompanied by his wife and reports he is doing well from a cardiac perspective.  No chest pain, palpitations, dizziness, presyncope, syncope.  He reports his wife is checking his blood pressure regularly at home with readings in the 174Y to 814G systolic on current medications including amlodipine 10 mg, HCTZ 25 mg, and lisinopril 40 mg.  He never did start spironolactone as outlined above.  He is now watching his sodium intake.  He is tolerating anticoagulation and notes no falls, hematochezia, BRBPR, hematuria, hemoptysis, or hematemesis.  No new focal neurological deficits.  He does have issues with what sounds like diabetic neuropathy as well as insomnia.   Labs independently reviewed: 07/2020 - HGB 11.8, PLT 254, potassium 3.5, BUN 16, SCr 0.96, albumin 3.8, AST/ALT normal 06/2020 - A1c 10.9 01/2020 - TC 139, TG 267, HDL 39, LDL 65 09/2019 - TSH normal    Past Medical History:  Diagnosis Date  . Carotid arterial disease ([place])    a. 08/2018 Carotid U/S: min-mod RICA atherosclerosis w/o hemodynamically significant stenosis. Nl LICA.  . Diabetes 1.5, managed as type 2 ([place])   . Diastolic dysfunction    a. 08/2018 Echo: EF 65%. No rwma. Gr1 DD. Mild MR.  . Diastolic dysfunction    a. 08/2019 Echo: EF 55-60%, Gr1 DD. No rwma. Mild MR. RVSP 37.43mHg.  .Marland KitchenHypercholesterolemia   . Hypertension   . PAF (paroxysmal atrial fibrillation) ([place]    a. 10/2019 Event Monitor: PAF; b. CHA2DS2VASc = 5-->Eliquis.  .Marland Kitchen  Poorly controlled diabetes mellitus ([place])    a. 04/2019 A1c 13.8.  Marland Kitchen Recurrent strokes ([place])    a. 10/2016 MRI/A: Acute 36m R thalamic infarct, ? subacute infarct of R corona radiata; b. 08/2017 MRI/A: Acute 845mlateral L thalamic infarct. Other more remote lacunar infarcts of thalami bilat. Small vessel dzs; c. 08/2018 MRI/A: Acute lacunar infarct of the post limb of R  internal capsule; d. 08/2019 MRI Acute CVA of L paramedian pons adn R cerebellar hemisphere.  . Tobacco abuse     Past Surgical History:  Procedure Laterality Date  . ESOPHAGOGASTRODUODENOSCOPY (EGD) WITH PROPOFOL N/A 04/26/2019   Procedure: ESOPHAGOGASTRODUODENOSCOPY (EGD) WITH PROPOFOL;  Surgeon: VaLin LandsmanMD;  [place] Service: G[facility]ology;  Laterality: N/A;    Current Medications: Current Meds  Medication Sig  . ACCU-CHEK GUIDE test strip   . Accu-Chek Softclix Lancets lancets SMARTSIG:Topical  . amLODipine (NORVASC) 10 MG tablet Take 1 tablet (10 mg total) by mouth daily.  . Marland Kitchenpixaban (ELIQUIS) 5 MG TABS tablet Take 1 tablet (5 mg total) by mouth 2 (two) times daily.  . Marland Kitchentorvastatin (LIPITOR) 40 MG tablet Take 80 mg by mouth daily.  . Blood Glucose Monitoring Suppl (FIFTY50 GLUCOSE METER 2.0) w/Device KIT Use as instructed  . Blood Glucose Monitoring Suppl KIT Accucheck Guide with lancets and test strips #100 with one refill.  Test blood sugar twice daily. DX Code E11.65 ; Z79.4  . Continuous Blood Gluc Receiver (FREESTYLE LIBRE 2 READER) DEVI 1 each by Does not apply route daily.  . Continuous Blood Gluc Sensor (FREESTYLE LIBRE 2 SENSOR) MISC 1 each by Does not apply route every 14 (fourteen) days.  . dapagliflozin propanediol (FARXIGA) 10 MG TABS tablet Take 1 tablet (10 mg total) by mouth daily before breakfast. In place of glipizide  . gabapentin (NEURONTIN) 300 MG capsule Take 2 capsules (600 mg total) by mouth 2 (two) times daily.  . Glucagon (GVOKE HYPOPEN 1-PACK) 1 MG/0.2ML SOAJ Inject 1 each into the skin daily as needed.  . hydrochlorothiazide (HYDRODIURIL) 25 MG tablet Take 1 tablet (25 mg total) by mouth daily.  . insulin detemir (LEVEMIR) 100 UNIT/ML injection Inject 35 Units into the skin daily.  . Insulin Pen Needle (NOVOFINE) 32G X 6 MM MISC 1 each by Does not apply route once a week.  . Marland Kitchenisinopril (ZESTRIL) 40 MG tablet Take 1 tablet (40  mg total) by mouth daily.  . metoCLOPramide (REGLAN) 5 MG tablet Take 1 tablet (5 mg total) by mouth 3 (three) times daily before meals.  . Misc. Devices MISC One pair of Compression stockings  Hemiplegia and hemiparesis following cerebral infarction affecting left non-dominant side (HCC) - Primary Codes: I6L57.262ower extremity edema  Codes: R60.0 Type 2 diabetes mellitus with hyperglycemia, with long-term current use of insulin (HCC)  Codes: E11.65, Z79.4  . omeprazole (PRILOSEC) 40 MG capsule Take 40 mg by mouth in the morning and at bedtime.  . pantoprazole (PROTONIX) 20 MG tablet Take 20 mg by mouth 2 (two) times daily.  . [DISCONTINUED] spironolactone (ALDACTONE) 25 MG tablet Take 1 tablet (25 mg total) by mouth daily.    Allergies:   Patient has no known allergies.   Social History   Socioeconomic History  . Marital status: Married    Spouse name: bePresenter, broadcasting. Number of children: 5  . Years of education: Not on file  . Highest education level: Not on file  Occupational History  . Occupation: unemployed  Tobacco  Use  . Smoking status: Former Smoker    Packs/day: 1.00    Years: 38.00    Pack years: 38.00    Types: Cigarettes    Start date: 1982  . Smokeless tobacco: Never Used  Vaping Use  . Vaping Use: Never used  Substance and Sexual Activity  . Alcohol use: Not Currently    Alcohol/week: 1.0 standard drink    Types: 1 Cans of beer per week    Comment: previously drank but nothing in 1-2 yrs (08/2019).  . Drug use: Not Currently    Types: Cocaine, Marijuana    Comment: prev used cocaine/marijuana but none x 1-2 yrs (08/2019).  . Sexual activity: Yes  Other Topics Concern  . Not on file  Social History Narrative   Lives in [place] with his wife.  Uses a cane when ambulating.  Can walk about 25 yds prior to having to rest - says he stumbles if he has to work further than that.  Usually uses a scooter to get around stores.   Social Determinants of Health    Financial Resource Strain: Not on file  Food Insecurity: Not on file  Transportation Needs: Not on file  Physical Activity: Not on file  Stress: Not on file  Social Connections: Not on file     Family History:  The patient's family history includes Diabetes in his father; Heart attack in his father; Hypertension in his father and mother; Stroke in his mother.  ROS:   Review of Systems  Constitutional: Positive for malaise/fatigue. Negative for chills, diaphoresis, fever and weight loss.  HENT: Negative for congestion.   Eyes: Negative for discharge and redness.  Respiratory: Negative for cough, hemoptysis, sputum production, shortness of breath and wheezing.   Cardiovascular: Negative for chest pain, palpitations, orthopnea, claudication, leg swelling and PND.  Gastrointestinal: Negative for abdominal pain, blood in stool, heartburn, melena, nausea and vomiting.  Genitourinary: Negative for hematuria.  Musculoskeletal: Negative for falls and myalgias.  Skin: Negative for rash.  Neurological: Positive for weakness. Negative for dizziness, tingling, tremors, sensory change, speech change, focal weakness and loss of consciousness.  Endo/Heme/Allergies: Does not bruise/bleed easily.  Psychiatric/Behavioral: Negative for substance abuse. The patient is not nervous/anxious.   All other systems reviewed and are negative.    EKGs/Labs/Other Studies Reviewed:    Studies reviewed were summarized above. The additional studies were reviewed today:  Zio patch 10/2019:  The patient was monitored for 5 days, 15 hours.  The predominant rhythm was sinus with an average rate of 62 bpm (range 38-138 bpm).  There were rare PAC's and PVC's.  No sustained arrhythmia or prolonged pause was identified.  There were no patient triggered events.   Predominantly sinus rhythm without significant arrhythmia.  Of note, preceding 14 day monitor showed paroxysmal atrial  fibrillation. __________  Elwyn Reach patch 09/2019:  The patient was monitored for 14 days.  The predominant rhythm was sinus with an average rate of 62 bpm (range 38 to 124 bpm in sinus).  Rare PACs and PVCs were noted.  Paroxysmal atrial fibrillation occurred, with the longest episode lasting 1 hours, 7 minutes. Average ventricular rate while in atrial fibrillation was 90 bpm with a range of 51 to 188 bpm. Atrial fibrillation burden was less than 1%.  There was no prolonged pause.  Patient triggered event corresponds to sinus rhythm.   Predominantly sinus rhythm with paroxysmal atrial fibrillation (longest episode lasting 1 hour, 7 minutes; <1% burden).  Rare PACs and PVCs also  noted. __________  Carlton Adam MPI 08/2019:  This is a low risk study.  The calculated left ventricular ejection fraction is moderately decreased (33%). This is likely a gating artifact as Visual EF appears normal at about 55%. recommend correlation with another imaging modality such as echocardiogram.  There was no evidence for ischemia  Mild diaphragmatic attenuation noted __________  2D echo 08/2019: 1. Left ventricular ejection fraction, by visual estimation, is 55 to  60%. The left ventricle has normal function. There is borderline left  ventricular hypertrophy.  2. Left ventricular diastolic parameters are consistent with Grade I  diastolic dysfunction (impaired relaxation).  3. The left ventricle has no regional wall motion abnormalities.  4. Global right ventricle has low normal systolic function.The right  ventricular size is normal. No increase in right ventricular wall  thickness.  5. Left atrial size was normal.  6. Right atrial size was normal.  7. The mitral valve is degenerative. Mild mitral valve regurgitation. No  evidence of mitral stenosis.  8. The tricuspid valve is grossly normal.  9. The tricuspid valve is grossly normal. Tricuspid valve regurgitation  is not demonstrated.   10. The aortic valve is tricuspid. Aortic valve regurgitation is not  visualized. No significant stenosis suspected, though evaluation is  limited by lack of spectral Doppler images.  11. The pulmonic valve was not well visualized. Pulmonic valve  regurgitation is not visualized.  12. Mildly elevated pulmonary artery systolic pressure.  13. The tricuspid regurgitant velocity is 2.94 m/s, and with an assumed  right atrial pressure of 3 mmHg, the estimated right ventricular systolic  pressure is mildly elevated at 37.6 mmHg.  14. The inferior vena cava is normal in size with greater than 50%  respiratory variability, suggesting right atrial pressure of 3 mmHg.  15. The interatrial septum was not well visualized. __________  2D echo 08/2018: - Left ventricle: The cavity size was normal. There was mild  concentric hypertrophy. Systolic function was normal. The  estimated ejection fraction was 65%. Wall motion was normal;  there were no regional wall motion abnormalities. Doppler  parameters are consistent with abnormal left ventricular  relaxation (grade 1 diastolic dysfunction).  - Mitral valve: There was mild regurgitation.   Impressions:   - No cardiac source of emboli was indentified. __________  2D echo 10/2016: - Left ventricle: The cavity size was normal. Wall thickness was  increased in a pattern of mild LVH. Systolic function was normal.  The estimated ejection fraction was in the range of 55% to 65%.   EKG:  EKG is not ordered today.    Recent Labs: 09/20/2020: ALT 57; Hemoglobin 12.0; Magnesium 1.9; Platelets 312 09/21/2020: BUN 21; Creatinine, Ser 0.89; Potassium 3.6; Sodium 139  Recent Lipid Panel    Component Value Date/Time   CHOL 139 02/13/2020 1417   CHOL 161 02/10/2018 1906   TRIG 267 (H) 02/13/2020 1417   HDL 39 (L) 02/13/2020 1417   HDL 64 02/10/2018 1906   CHOLHDL 3.6 02/13/2020 1417   VLDL 13 09/02/2018 0420   LDLCALC 65 02/13/2020 1417     PHYSICAL EXAM:    VS:  BP 126/80 (BP Location: Right Arm, Patient Position: Sitting, Cuff Size: Normal)   Pulse (!) 57   Ht 5' 6"  (1.676 m)   Wt 165 lb (74.8 kg)   SpO2 99%   BMI 26.63 kg/m   BMI: Body mass index is 26.63 kg/m.  Physical Exam Vitals reviewed.  Constitutional:      Appearance:  He is well-developed and well-nourished.  HENT:     Head: Normocephalic and atraumatic.  Eyes:     General:        Right eye: No discharge.        Left eye: No discharge.  Neck:     Vascular: No JVD.  Cardiovascular:     Rate and Rhythm: Normal rate and regular rhythm.     Pulses: No midsystolic click and no opening snap.          Posterior tibial pulses are 2+ on the right side and 2+ on the left side.     Heart sounds: Normal heart sounds, S1 normal and S2 normal. Heart sounds not distant. No murmur heard. No friction rub.  Pulmonary:     Effort: Pulmonary effort is normal. No respiratory distress.     Breath sounds: Normal breath sounds. No decreased breath sounds, wheezing or rales.  Chest:     Chest wall: No tenderness.  Abdominal:     General: There is no distension.     Palpations: Abdomen is soft.     Tenderness: There is no abdominal tenderness.  Musculoskeletal:        General: No edema.     Cervical back: Normal range of motion.  Skin:    General: Skin is warm and dry.     Nails: There is no clubbing or cyanosis.  Neurological:     Mental Status: He is alert and oriented to person, place, and time.  Psychiatric:        Mood and Affect: Mood and affect normal.        Speech: Speech normal.        Behavior: Behavior normal.        Thought Content: Thought content normal.        Judgment: Judgment normal.     Wt Readings from Last 3 Encounters:  10/03/20 165 lb (74.8 kg)  09/26/20 163 lb (73.9 kg)  09/20/20 160 lb (72.6 kg)     ASSESSMENT & PLAN:   1. HTN: Blood pressure blood pressure is significantly improved and well controlled in the office and at  home.  Some of this improvement is possibly related to reduced sodium intake as well as better medication adherence.  Given that well-controlled BP readings we will recommend that he not to start spironolactone and discontinue this medication.  We will call the pharmacy to cancel the prescription.  He will otherwise continue amlodipine 10 mg, HCTZ 25 mg, and lisinopril 40 mg.  We will check a BMP today.  2. PAF: Maintaining sinus rhythm with a mildly bradycardic heart rate not requiring AV nodal blocking medications.  Given his CHA2DS2-VASc of 4 (HTN, CVA x2, vascular disease) he remains on apixaban without symptoms concerning for bleeding.  Check CBC today.  3. Diastolic dysfunction: He appears euvolemic and well compensated.  Optimal blood pressure control is recommended.  He remains on low-dose HCTZ.  Check BMP today.  4. HLD: LDL 65 on 01/2020 with normal LFT in 07/2020.  He remains on atorvastatin.  5. Recurrent CVA with left-sided hemiparesis/cerebrovascular and carotid disease: No new neurological deficits.  He remains on apixaban with documented PAF as well as atorvastatin.  Follow-up with PCP/neurology as directed.  6. Diabetes with history of DKA and possible diabetic neuropathy: Follow-up with PCP as directed.  He did ask for pain medication today which was declined.  7. Insomnia: Recommend he discuss this with PCP.  Disposition: F/u with Dr.  End or an APP in 6 months.   Medication Adjustments/Labs and Tests Ordered: Current medicines are reviewed at length with the patient today.  Concerns regarding medicines are outlined above. Medication changes, Labs and Tests ordered today are summarized above and listed in the Patient Instructions accessible in Encounters.   Signed, Christell Faith, PA-C 10/03/2020 11:39 AM     [facility] [address] [place], [place] [address] [phone]

## 2020-09-28 NOTE — Therapy (Signed)
[facility] [address] [place], [place], [address] Phone: [phone]   Fax:  [phone]  Physical Therapy Treatment  Patient Details  Name: Evan Townsend. MRN: [id number] Date of Birth: 1956/02/27 Referring Provider (PT): Welford Roche MD   Encounter Date: 09/26/2020   PT End of Session - 09/28/20 1112    Visit Number 2    Number of Visits 24    Date for PT Re-Evaluation 12/12/20    Authorization - Visit Number 2    Authorization - Number of Visits 10    PT Start Time 1425    PT Stop Time 1522    PT Time Calculation (min) 57 min    Equipment Utilized During Treatment Gait belt    Activity Tolerance Patient tolerated treatment well    Behavior During Therapy WFL for tasks assessed/performed           Past Medical History:  Diagnosis Date  . Carotid arterial disease (HCC)    a. 08/2018 Carotid U/S: min-mod RICA atherosclerosis w/o hemodynamically significant stenosis. Nl LICA.  . Diabetes 1.5, managed as type 2 (HCC)   . Diastolic dysfunction    a. 08/2018 Echo: EF 65%. No rwma. Gr1 DD. Mild MR.  . Diastolic dysfunction    a. 08/2019 Echo: EF 55-60%, Gr1 DD. No rwma. Mild MR. RVSP 37.64mmHg.  Marland Kitchen Hypercholesterolemia   . Hypertension   . PAF (paroxysmal atrial fibrillation) (HCC)    a. 10/2019 Event Monitor: PAF; b. CHA2DS2VASc = 5-->Eliquis.  Marland Kitchen Poorly controlled diabetes mellitus (HCC)    a. 04/2019 A1c 13.8.  Marland Kitchen Recurrent strokes (HCC)    a. 10/2016 MRI/A: Acute 78mm R thalamic infarct, ? subacute infarct of R corona radiata; b. 08/2017 MRI/A: Acute 35mm lateral L thalamic infarct. Other more remote lacunar infarcts of thalami bilat. Small vessel dzs; c. 08/2018 MRI/A: Acute lacunar infarct of the post limb of R internal capsule; d. 08/2019 MRI Acute CVA of L paramedian pons adn R cerebellar hemisphere.  . Tobacco abuse     Past Surgical History:  Procedure Laterality Date  . ESOPHAGOGASTRODUODENOSCOPY (EGD) WITH  PROPOFOL N/A 04/26/2019   Procedure: ESOPHAGOGASTRODUODENOSCOPY (EGD) WITH PROPOFOL;  Surgeon: Toney Reil, MD;  Location: [facility] ENDOSCOPY;  Service: Gastroenterology;  Laterality: N/A;    There were no vitals filed for this visit.   Subjective Assessment - 09/28/20 1110    Subjective Pt. reports going to ER last week due to abdominal pain/ elevated blood sugar.  Pt. states he is doing much better and blood sugar/ BP are stable today.  No c/o L shoulder prior to tx. session.    Pertinent History Pertinent history and PMH: HTN, uncontrolled DM, HLD, R basal ganglia stroke on 08/2018 and L pointine and R cerebellar stroke 09/07/19, sinus bradycardia, peripheral neuropat    Patient Stated Goals Improve strengthening/ walking.  Return to fishing.    Currently in Pain? No/denies    Pain Onset More than a month ago            There.ex.:  Nustep L2 10 min. B UE/LE (focus on L elbow extension)- consistent cadence  Seated marching/ LAQ/ knee flexion 20x each.  Reviewed HEP   Manual tx.:  Supine L elbow/shoulder AA/PROM ROM (static holds)- 5x each  STM to L shoulder/distal biceps during stretches   Neuro.mm.:  Walking in //-bars with cone taps (attempting reciprocal pattern)- difficulty  Standing cone taps in //-bars (1 UE assist required).  Step  touches with R UE assist required for balance/ safety.  Walking in PT clinic working on consistent L hip flexion/ step length      PT Long Term Goals - 09/23/20 0758      PT LONG TERM GOAL #1   Title Pt will increase Berg Balance to 48/56 to demonstrate clinically significant improvement in balance decreasing pt's risk for falls during functional activities.    Baseline 2/3: 42/56    Time 12    Period Weeks    Status New    Target Date 12/12/20      PT LONG TERM GOAL #2   Title Pt will improve 10 m gait speed to > 0.5 m/s  with LRAD to improve safe community ambulation.    Baseline 2/3: 0.32 m/s with SPC and SBA/CGA    Time  12    Period Weeks    Status New    Target Date 12/12/20      PT LONG TERM GOAL #3   Title Pt will improve 5xSTS to < 12 sec with no RUE support to demonstrate clinically significant improvement in BLE strength to improve STS t/f's like standing from the toilet.    Baseline 2/3: 13.5 sec.    Time 12    Period Weeks    Status New    Target Date 12/12/20      PT LONG TERM GOAL #4   Title Pt will improve DGI by a minimum of 5 points to demonstrate clinically significant improvement and safety with community ambulation tasks to decrease risk of falls.    Baseline TBD    Time 12    Period Weeks    Status New    Target Date 12/12/20      PT LONG TERM GOAL #5   Title Pt will improve FOTO score to target amount to demonstrate perceived improvements in ability to complete ADL's.    Baseline 10/20: Perform next session.    Time 12    Period Weeks    Status New    Target Date 12/12/20                 Plan - 09/28/20 1113    Clinical Impression Statement Pt. arrived to PT motivated to work on L UE/LE strengthening and walking/balance ex.  Pt. reports no L shoulder/UE pain during Nustep use with focus on increase L elbow extension.  Cuing for proper technique with seated posture/ LE ex. program at mat table.  Difficulty with consistent L hip flexion/ step length while walking in //-bars and with use of cane.  No change to HEP at this time.    Personal Factors and Comorbidities Age;Comorbidity 3+;Education;Finances;Past/Current Experience;Fitness;Transportation    Comorbidities HTN, uncontrolled DM, HLD, R basal ganglia stroke on 08/2018 and L pointine and R cerebellar stroke 09/07/19, sinus bradycardia, peripheral neuropathy    Examination-Activity Limitations Bathing;Bed Mobility;Dressing;Transfers;Squat;Lift;Locomotion Level;Stairs;Reach Overhead;Carry;Stand;Sit    Examination-Participation Restrictions Community Activity;Interpersonal Relationship;Yard Work    Midwife Evolving/Moderate complexity    Clinical Decision Making Moderate    Rehab Potential Fair    PT Frequency 2x / week    PT Duration 12 weeks    PT Treatment/Interventions ADLs/Self Care Home Management;Aquatic Therapy;Electrical Stimulation;Moist Heat;DME Instruction;Gait training;Stair training;Functional mobility training;Therapeutic activities;Therapeutic exercise;Balance training;Neuromuscular re-education;Patient/family education;Orthotic Fit/Training;Dry needling;Manual techniques;Canalith Repostioning;Passive range of motion;Biofeedback    PT Next Visit Plan Issue HEP/ balance training.    PT Home Exercise Plan HEP: standing hip marches/abd, ankle DF/PF in sitting/supine, passive L  elbow stretch for tone    Consulted and Agree with Plan of Care Patient    Family Member Consulted wife           Patient will benefit from skilled therapeutic intervention in order to improve the following deficits and impairments:  Abnormal gait,Decreased coordination,Decreased range of motion,Difficulty walking,Impaired tone,Decreased endurance,Impaired UE functional use,Decreased activity tolerance,Impaired perceived functional ability,Decreased balance,Impaired flexibility,Decreased cognition,Decreased mobility,Decreased strength,Increased edema,Postural dysfunction,Pain  Visit Diagnosis: Hemiplegia and hemiparesis following cerebral infarction affecting left non-dominant side (HCC)  Difficulty in walking, not elsewhere classified  At risk for falls  Muscle weakness (generalized)  Pain in left arm  Unsteadiness on feet     Problem List Patient Active Problem List   Diagnosis Date Noted  . Diastolic dysfunction   . DKA (diabetic ketoacidosis) (HCC) 09/20/2020  . Abnormal EKG 08/16/2020  . CKD (chronic kidney disease) stage 2, GFR 60-89 ml/min 06/27/2020  . Monitoring for anticoagulant use 06/27/2020  . Lower extremity edema 05/16/2020  . Paroxysmal atrial fibrillation  (HCC) 10/26/2019  . Coagulopathy (HCC) 06/12/2019  . Gastroesophageal reflux disease   . Hemiplegia and hemiparesis following cerebral infarction affecting left non-dominant side (HCC) 11/25/2018  . RBBB 10/15/2018  . Dysphagia, post-stroke   . Microalbuminuria 07/29/2018  . Hyperlipidemia LDL goal <70 02/11/2017  . Recurrent strokes (HCC) 11/10/2016  . Essential hypertension 11/10/2016  . Tobacco abuse 08/28/2013  . Peripheral neuropathy 08/28/2013   Cammie Mcgee, PT, DPT # [phone] 09/28/2020, 11:35 AM  [facility] [address] [place], [place], [address] Phone: [phone]   Fax:  [phone]  Name: Evan Townsend Townsend. MRN: [id number] Date of Birth: [birth year]

## 2020-10-03 ENCOUNTER — Other Ambulatory Visit: Payer: Self-pay

## 2020-10-03 ENCOUNTER — Ambulatory Visit: Payer: Medicaid Other | Admitting: Physical Therapy

## 2020-10-03 ENCOUNTER — Encounter: Payer: Self-pay | Admitting: Physical Therapy

## 2020-10-03 ENCOUNTER — Ambulatory Visit (INDEPENDENT_AMBULATORY_CARE_PROVIDER_SITE_OTHER): Payer: Medicaid Other | Admitting: Physician Assistant

## 2020-10-03 ENCOUNTER — Encounter: Payer: Self-pay | Admitting: Physician Assistant

## 2020-10-03 VITALS — BP 126/80 | HR 57 | Ht 66.0 in | Wt 165.0 lb

## 2020-10-03 DIAGNOSIS — I480 Paroxysmal atrial fibrillation: Secondary | ICD-10-CM

## 2020-10-03 DIAGNOSIS — R262 Difficulty in walking, not elsewhere classified: Secondary | ICD-10-CM | POA: Diagnosis not present

## 2020-10-03 DIAGNOSIS — I10 Essential (primary) hypertension: Secondary | ICD-10-CM

## 2020-10-03 DIAGNOSIS — I5189 Other ill-defined heart diseases: Secondary | ICD-10-CM

## 2020-10-03 DIAGNOSIS — E785 Hyperlipidemia, unspecified: Secondary | ICD-10-CM | POA: Diagnosis not present

## 2020-10-03 DIAGNOSIS — I639 Cerebral infarction, unspecified: Secondary | ICD-10-CM | POA: Diagnosis not present

## 2020-10-03 DIAGNOSIS — R2681 Unsteadiness on feet: Secondary | ICD-10-CM | POA: Diagnosis not present

## 2020-10-03 DIAGNOSIS — M79602 Pain in left arm: Secondary | ICD-10-CM

## 2020-10-03 DIAGNOSIS — Z9181 History of falling: Secondary | ICD-10-CM

## 2020-10-03 DIAGNOSIS — G4700 Insomnia, unspecified: Secondary | ICD-10-CM | POA: Diagnosis not present

## 2020-10-03 DIAGNOSIS — M6281 Muscle weakness (generalized): Secondary | ICD-10-CM | POA: Diagnosis not present

## 2020-10-03 DIAGNOSIS — I69354 Hemiplegia and hemiparesis following cerebral infarction affecting left non-dominant side: Secondary | ICD-10-CM

## 2020-10-03 NOTE — Therapy (Signed)
[facility] [address] [place], [place], [address] Phone: [phone]   Fax:  [phone]  Physical Therapy Treatment  Patient Details  Name: Evan PORRECA Sr. MRN: [id number] Date of Birth: [birth year] Referring Provider (PT): Welford Roche MD   Encounter Date: 10/03/2020    PT End of Session - 10/03/20 1441    Visit Number 3    Number of Visits 24    Date for PT Re-Evaluation 12/12/20    Authorization - Visit Number 3    Authorization - Number of Visits 10    PT Start Time 1432 to 1536   Equipment Utilized During Treatment Gait belt    Activity Tolerance Patient tolerated treatment well    Behavior During Therapy [facility] for tasks assessed/performed           Past Medical History:  Diagnosis Date  . Carotid arterial disease (HCC)    a. 08/2018 Carotid U/S: min-mod RICA atherosclerosis w/o hemodynamically significant stenosis. Nl LICA.  . Diabetes 1.5, managed as type 2 (HCC)   . Diastolic dysfunction    a. 08/2018 Echo: EF 65%. No rwma. Gr1 DD. Mild MR.  . Diastolic dysfunction    a. 08/2019 Echo: EF 55-60%, Gr1 DD. No rwma. Mild MR. RVSP 37.67mmHg.  Marland Kitchen Hypercholesterolemia   . Hypertension   . PAF (paroxysmal atrial fibrillation) (HCC)    a. 10/2019 Event Monitor: PAF; b. CHA2DS2VASc = 5-->Eliquis.  Marland Kitchen Poorly controlled diabetes mellitus (HCC)    a. 04/2019 A1c 13.8.  Marland Kitchen Recurrent strokes (HCC)    a. 10/2016 MRI/A: Acute 74mm R thalamic infarct, ? subacute infarct of R corona radiata; b. 08/2017 MRI/A: Acute 39mm lateral L thalamic infarct. Other more remote lacunar infarcts of thalami bilat. Small vessel dzs; c. 08/2018 MRI/A: Acute lacunar infarct of the post limb of R internal capsule; d. 08/2019 MRI Acute CVA of L paramedian pons adn R cerebellar hemisphere.  . Tobacco abuse     Past Surgical History:  Procedure Laterality Date  . ESOPHAGOGASTRODUODENOSCOPY (EGD) WITH PROPOFOL N/A 04/26/2019   Procedure:  ESOPHAGOGASTRODUODENOSCOPY (EGD) WITH PROPOFOL;  Surgeon: Toney Reil, MD;  Location: [facility] ENDOSCOPY;  Service: Gastroenterology;  Laterality: N/A;    There were no vitals filed for this visit.   Subjective Assessment - 10/03/20 1436    Subjective Pt. had appt. with Cardiologist today and pt. reports everything went well.  BP was WNL.    Pertinent History Pertinent history and PMH: HTN, uncontrolled DM, HLD, R basal ganglia stroke on 08/2018 and L pointine and R cerebellar stroke 09/07/19, sinus bradycardia, peripheral neuropat    Patient Stated Goals Improve strengthening/ walking.  Return to fishing.    Currently in Pain? No/denies    Pain Onset More than a month ago              There.ex.:  Nustep L4 10 min. B UE/LE (focus on L elbow extension)- consistent cadence/ no rest breaks.  Seated marching/ LAQ/ knee flexion 20x each.  Seated L elbow/shoulder AA/PROM ROM (static holds)- 5x each  STM to L shoulder/distal biceps during stretches  Seated wand ex.: AAROM sh. Flexion/ abduction/ chest press with PT assist (mirror feedback).     Neuro.mm.:  Walking in //-bars with cone taps (attempting reciprocal pattern)- difficulty  Step touches (6") with light UE assist required in //-bars.  Walking in PT clinic working on consistent L hip flexion/ step length       PT Long Term  Goals - 09/23/20 0758      PT LONG TERM GOAL #1   Title Pt will increase Berg Balance to 48/56 to demonstrate clinically significant improvement in balance decreasing pt's risk for falls during functional activities.    Baseline 2/3: 42/56    Time 12    Period Weeks    Status New    Target Date 12/12/20      PT LONG TERM GOAL #2   Title Pt will improve 10 m gait speed to > 0.5 m/s  with LRAD to improve safe community ambulation.    Baseline 2/3: 0.32 m/s with SPC and SBA/CGA    Time 12    Period Weeks    Status New    Target Date 12/12/20      PT LONG TERM GOAL #3   Title  Pt will improve 5xSTS to < 12 sec with no RUE support to demonstrate clinically significant improvement in BLE strength to improve STS t/f's like standing from the toilet.    Baseline 2/3: 13.5 sec.    Time 12    Period Weeks    Status New    Target Date 12/12/20      PT LONG TERM GOAL #4   Title Pt will improve DGI by a minimum of 5 points to demonstrate clinically significant improvement and safety with community ambulation tasks to decrease risk of falls.    Baseline TBD    Time 12    Period Weeks    Status New    Target Date 12/12/20      PT LONG TERM GOAL #5   Title Pt will improve FOTO score to target amount to demonstrate perceived improvements in ability to complete ADL's.    Baseline 10/20: Perform next session.    Time 12    Period Weeks    Status New    Target Date 12/12/20            Increase L elbow AA/PROM after stretching and wand ex. program. Pt. benefits from moderate cuing and use of mirror feedback for proper technique. Assist required while using wand during sh. flexion/ chest presses. Moderate cuing to increase L arm swing during gait with QC in clinic/ hallway. No LOB but L knee hyperextension during gait training/ standing ex. No changes to HEP at this time. Pt. instructed to consistently check blood sugar/ BP on a regular basis.       Patient will benefit from skilled therapeutic intervention in order to improve the following deficits and impairments:  Abnormal gait,Decreased coordination,Decreased range of motion,Difficulty walking,Impaired tone,Decreased endurance,Impaired UE functional use,Decreased activity tolerance,Impaired perceived functional ability,Decreased balance,Impaired flexibility,Decreased cognition,Decreased mobility,Decreased strength,Increased edema,Postural dysfunction,Pain  Visit Diagnosis: Hemiplegia and hemiparesis following cerebral infarction affecting left non-dominant side (HCC)  Difficulty in walking, not elsewhere  classified  At risk for falls  Muscle weakness (generalized)  Pain in left arm  Unsteadiness on feet     Problem List Patient Active Problem List   Diagnosis Date Noted  . Diastolic dysfunction   . DKA (diabetic ketoacidosis) (HCC) 09/20/2020  . Abnormal EKG 08/16/2020  . CKD (chronic kidney disease) stage 2, GFR 60-89 ml/min 06/27/2020  . Monitoring for anticoagulant use 06/27/2020  . Lower extremity edema 05/16/2020  . Paroxysmal atrial fibrillation (HCC) 10/26/2019  . Coagulopathy (HCC) 06/12/2019  . Gastroesophageal reflux disease   . Hemiplegia and hemiparesis following cerebral infarction affecting left non-dominant side (HCC) 11/25/2018  . RBBB 10/15/2018  . Dysphagia, post-stroke   .  Microalbuminuria 07/29/2018  . Hyperlipidemia LDL goal <70 02/11/2017  . Recurrent strokes (HCC) 11/10/2016  . Essential hypertension 11/10/2016  . Tobacco abuse 08/28/2013  . Peripheral neuropathy 08/28/2013   Cammie Mcgee, PT, DPT # [phone] 10/05/2020, 8:08 PM  [facility] [address] [place], [place], [address] Phone: [phone]   Fax:  [phone]  Name: Evan Ano Sr. MRN: [id number] Date of Birth: [birth year]

## 2020-10-03 NOTE — Patient Instructions (Signed)
Medication Instructions:  DISCONTINUE spironolactone.  *If you need a refill on your cardiac medications before your next appointment, please call your pharmacy*   Lab Work: BMP and CBC ordered today.  If you have labs (blood work) drawn today and your tests are completely normal, you will receive your results only by: Marland Kitchen MyChart Message (if you have MyChart) OR . A paper copy in the mail If you have any lab test that is abnormal or we need to change your treatment, we will call you to review the results.   Testing/Procedures: None ordered today.   Follow-Up: At [facility], you and your health needs are our priority.  As part of our continuing mission to provide you with exceptional heart care, we have created designated Provider Care Teams.  These Care Teams include your primary Cardiologist (physician) and Advanced Practice Providers (APPs -  Physician Assistants and Nurse Practitioners) who all work together to provide you with the care you need, when you need it.  We recommend signing up for the patient portal called "MyChart".  Sign up information is provided on this After Visit Summary.  MyChart is used to connect with patients for Virtual Visits (Telemedicine).  Patients are able to view lab/test results, encounter notes, upcoming appointments, etc.  Non-urgent messages can be sent to your provider as well.   To learn more about what you can do with MyChart, go to [email or web address].    Your next appointment:   6 month(s)  The format for your next appointment:   In Person  Provider:   You may see [name], MD or one of the following Advanced Practice Providers on your designated Care Team:    Nicolasa Ducking, NP  Eula Listen, PA-C  Marisue Ivan, PA-C  C[place], [place]  Gillian Shields, NP

## 2020-10-04 ENCOUNTER — Telehealth: Payer: Self-pay | Admitting: *Deleted

## 2020-10-04 LAB — CBC WITH DIFFERENTIAL/PLATELET
Basophils Absolute: 0 10*3/uL (ref 0.0–0.2)
Basos: 0 %
EOS (ABSOLUTE): 0.1 10*3/uL (ref 0.0–0.4)
Eos: 1 %
Hematocrit: 34 % — ABNORMAL LOW (ref 37.5–51.0)
Hemoglobin: 11.5 g/dL — ABNORMAL LOW (ref 13.0–17.7)
Immature Grans (Abs): 0 10*3/uL (ref 0.0–0.1)
Immature Granulocytes: 0 %
Lymphocytes Absolute: 1 10*3/uL (ref 0.7–3.1)
Lymphs: 14 %
MCH: 29.4 pg (ref 26.6–33.0)
MCHC: 33.8 g/dL (ref 31.5–35.7)
MCV: 87 fL (ref 79–97)
Monocytes Absolute: 0.3 10*3/uL (ref 0.1–0.9)
Monocytes: 4 %
Neutrophils Absolute: 5.7 10*3/uL (ref 1.4–7.0)
Neutrophils: 81 %
Platelets: 201 10*3/uL (ref 150–450)
RBC: 3.91 x10E6/uL — ABNORMAL LOW (ref 4.14–5.80)
RDW: 14.8 % (ref 11.6–15.4)
WBC: 7.2 10*3/uL (ref 3.4–10.8)

## 2020-10-04 LAB — BASIC METABOLIC PANEL
BUN/Creatinine Ratio: 25 — ABNORMAL HIGH (ref 10–24)
BUN: 30 mg/dL — ABNORMAL HIGH (ref 8–27)
CO2: 21 mmol/L (ref 20–29)
Calcium: 9.5 mg/dL (ref 8.6–10.2)
Chloride: 109 mmol/L — ABNORMAL HIGH (ref 96–106)
Creatinine, Ser: 1.18 mg/dL (ref 0.76–1.27)
GFR calc Af Amer: 75 mL/min/{1.73_m2} (ref >59)
GFR calc non Af Amer: 65 mL/min/{1.73_m2} (ref >59)
Glucose: 153 mg/dL — ABNORMAL HIGH (ref 65–99)
Potassium: 4.5 mmol/L (ref 3.5–5.2)
Sodium: 145 mmol/L — ABNORMAL HIGH (ref 134–144)

## 2020-10-04 NOTE — Telephone Encounter (Signed)
-----   Message from [name], PA-C sent at 10/04/2020  7:55 AM EST ----- Random glucose is mildly elevated Renal function is ok and consistent with prior readings Potassium at goal Blood count is mildly low and consistent with prior readings No changes from plan discussed at his office visit

## 2020-10-04 NOTE — Telephone Encounter (Signed)
Attempted to call pt to review lab results.  No answer. Unable to leave vm, mailbox is full.

## 2020-10-08 ENCOUNTER — Other Ambulatory Visit: Payer: Self-pay

## 2020-10-08 ENCOUNTER — Ambulatory Visit: Payer: Medicaid Other | Admitting: Physical Therapy

## 2020-10-09 ENCOUNTER — Other Ambulatory Visit: Payer: Self-pay

## 2020-10-10 ENCOUNTER — Ambulatory Visit: Payer: Medicaid Other | Admitting: Physical Therapy

## 2020-10-10 ENCOUNTER — Encounter: Payer: Self-pay | Admitting: Physical Therapy

## 2020-10-10 ENCOUNTER — Other Ambulatory Visit: Payer: Self-pay

## 2020-10-10 DIAGNOSIS — M6281 Muscle weakness (generalized): Secondary | ICD-10-CM

## 2020-10-10 DIAGNOSIS — R262 Difficulty in walking, not elsewhere classified: Secondary | ICD-10-CM

## 2020-10-10 DIAGNOSIS — M79602 Pain in left arm: Secondary | ICD-10-CM

## 2020-10-10 DIAGNOSIS — Z9181 History of falling: Secondary | ICD-10-CM

## 2020-10-10 DIAGNOSIS — I69354 Hemiplegia and hemiparesis following cerebral infarction affecting left non-dominant side: Secondary | ICD-10-CM

## 2020-10-10 DIAGNOSIS — R2681 Unsteadiness on feet: Secondary | ICD-10-CM | POA: Diagnosis not present

## 2020-10-10 NOTE — Therapy (Signed)
[facility] [address]. [place], [place], [address] Phone: [phone]   Fax:  [phone]  Physical Therapy Treatment  Patient Details  Name: Evan WINKLEMAN Sr. MRN: [id number] Date of Birth: 1955/10/14 Referring Provider (PT): Welford Roche MD   Encounter Date: 10/10/2020   PT End of Session - 10/11/20 2037    Visit Number 4    Number of Visits 24    Date for PT Re-Evaluation 12/12/20    Authorization - Visit Number 4    Authorization - Number of Visits 10    PT Start Time 1424    PT Stop Time 1527    PT Time Calculation (min) 63 min    Equipment Utilized During Treatment Gait belt    Activity Tolerance Patient tolerated treatment well    Behavior During Therapy WFL for tasks assessed/performed           Past Medical History:  Diagnosis Date  . Carotid arterial disease (HCC)    a. 08/2018 Carotid U/S: min-mod RICA atherosclerosis w/o hemodynamically significant stenosis. Nl LICA.  . Diabetes 1.5, managed as type 2 (HCC)   . Diastolic dysfunction    a. 08/2018 Echo: EF 65%. No rwma. Gr1 DD. Mild MR.  . Diastolic dysfunction    a. 08/2019 Echo: EF 55-60%, Gr1 DD. No rwma. Mild MR. RVSP 37.59mmHg.  Marland Kitchen Hypercholesterolemia   . Hypertension   . PAF (paroxysmal atrial fibrillation) (HCC)    a. 10/2019 Event Monitor: PAF; b. CHA2DS2VASc = 5-->Eliquis.  Marland Kitchen Poorly controlled diabetes mellitus (HCC)    a. 04/2019 A1c 13.8.  Marland Kitchen Recurrent strokes (HCC)    a. 10/2016 MRI/A: Acute 42mm R thalamic infarct, ? subacute infarct of R corona radiata; b. 08/2017 MRI/A: Acute 45mm lateral L thalamic infarct. Other more remote lacunar infarcts of thalami bilat. Small vessel dzs; c. 08/2018 MRI/A: Acute lacunar infarct of the post limb of R internal capsule; d. 08/2019 MRI Acute CVA of L paramedian pons adn R cerebellar hemisphere.  . Tobacco abuse     Past Surgical History:  Procedure Laterality Date  . ESOPHAGOGASTRODUODENOSCOPY (EGD) WITH  PROPOFOL N/A 04/26/2019   Procedure: ESOPHAGOGASTRODUODENOSCOPY (EGD) WITH PROPOFOL;  Surgeon: Toney Reil, MD;  Location: [facility] ENDOSCOPY;  Service: Gastroenterology;  Laterality: N/A;    There were no vitals filed for this visit.   Subjective Assessment - 10/11/20 2030    Subjective Pt. reports missing last appt. due to transportation issues.  Pt. reports no L shoulder pain at this time.    Pertinent History Pertinent history and PMH: HTN, uncontrolled DM, HLD, R basal ganglia stroke on 08/2018 and L pointine and R cerebellar stroke 09/07/19, sinus bradycardia, peripheral neuropat    Patient Stated Goals Improve strengthening/ walking.  Return to fishing.    Currently in Pain? No/denies    Pain Onset More than a month ago             There.ex.:  Nustep L4 10 min. B UE/LE (focus on L elbow extension)- consistent cadence/ no rest breaks.  Seated L elbow A/PROM and supine/seated wand ex. (flexion/ chest press/ abduction)- 20x  Seated resisted chest press 20x. With wand.   Supine SAQ/bolster bridging 20x each.  (cuing to slow down).  STM to L shoulder/distal biceps during stretches  Seated marching/ LAQ/ knee flexion/ standing hip flexion/ abduction/ extension 20x each (at //-bars).   STS from blue mat table (varying heights)- instruction in proper technique/ wt. Shifting to L.  Neuro.mm.:  Walking in //-bars forward/ lateral walking 4x each  Walking in PT clinic working on consistent L hip flexion/ step length  Standing wt. Shifting in front of blue table (CGA for safety).    Posture correction in sitting/ standing (cuing to prevent posterior leaning).       PT Long Term Goals - 09/23/20 0758      PT LONG TERM GOAL #1   Title Pt will increase Berg Balance to 48/56 to demonstrate clinically significant improvement in balance decreasing pt's risk for falls during functional activities.    Baseline 2/3: 42/56    Time 12    Period Weeks    Status New     Target Date 12/12/20      PT LONG TERM GOAL #2   Title Pt will improve 10 m gait speed to > 0.5 m/s  with LRAD to improve safe community ambulation.    Baseline 2/3: 0.32 m/s with SPC and SBA/CGA    Time 12    Period Weeks    Status New    Target Date 12/12/20      PT LONG TERM GOAL #3   Title Pt will improve 5xSTS to < 12 sec with no RUE support to demonstrate clinically significant improvement in BLE strength to improve STS t/f's like standing from the toilet.    Baseline 2/3: 13.5 sec.    Time 12    Period Weeks    Status New    Target Date 12/12/20      PT LONG TERM GOAL #4   Title Pt will improve DGI by a minimum of 5 points to demonstrate clinically significant improvement and safety with community ambulation tasks to decrease risk of falls.    Baseline TBD    Time 12    Period Weeks    Status New    Target Date 12/12/20      PT LONG TERM GOAL #5   Title Pt will improve FOTO score to target amount to demonstrate perceived improvements in ability to complete ADL's.    Baseline 10/20: Perform next session.    Time 12    Period Weeks    Status New    Target Date 12/12/20                 Plan - 10/11/20 2037    Clinical Impression Statement L elbow extension/ shoulder flexion requires AAROM to promote increase ROM.  L distal biceps tightness/ discomfort with static holds/ wand ex. in sittind and supine position.  Pt. able to maintain L hand grasp during wand ex. in sitting on blue mat table and during Nustep ex.  Occasional clipping on toe/ foot in //-bars with hurdle step overs and step touches.  No change to HEP.    Personal Factors and Comorbidities Age;Comorbidity 3+;Education;Finances;Past/Current Experience;Fitness;Transportation    Comorbidities HTN, uncontrolled DM, HLD, R basal ganglia stroke on 08/2018 and L pointine and R cerebellar stroke 09/07/19, sinus bradycardia, peripheral neuropathy    Examination-Activity Limitations Bathing;Bed  Mobility;Dressing;Transfers;Squat;Lift;Locomotion Level;Stairs;Reach Overhead;Carry;Stand;Sit    Examination-Participation Restrictions Community Activity;Interpersonal Relationship;Yard Work    Conservation officer, historic buildings Evolving/Moderate complexity    Clinical Decision Making Moderate    Rehab Potential Fair    PT Frequency 2x / week    PT Duration 12 weeks    PT Treatment/Interventions ADLs/Self Care Home Management;Aquatic Therapy;Electrical Stimulation;Moist Heat;DME Instruction;Gait training;Stair training;Functional mobility training;Therapeutic activities;Therapeutic exercise;Balance training;Neuromuscular re-education;Patient/family education;Orthotic Fit/Training;Dry needling;Manual techniques;Canalith Repostioning;Passive range of motion;Biofeedback    PT  Next Visit Plan Issue HEP/ balance training.    PT Home Exercise Plan HEP: standing hip marches/abd, ankle DF/PF in sitting/supine, passive L elbow stretch for tone    Consulted and Agree with Plan of Care Patient    Family Member Consulted wife           Patient will benefit from skilled therapeutic intervention in order to improve the following deficits and impairments:  Abnormal gait,Decreased coordination,Decreased range of motion,Difficulty walking,Impaired tone,Decreased endurance,Impaired UE functional use,Decreased activity tolerance,Impaired perceived functional ability,Decreased balance,Impaired flexibility,Decreased cognition,Decreased mobility,Decreased strength,Increased edema,Postural dysfunction,Pain  Visit Diagnosis: Hemiplegia and hemiparesis following cerebral infarction affecting left non-dominant side (HCC)  Difficulty in walking, not elsewhere classified  At risk for falls  Muscle weakness (generalized)  Pain in left arm     Problem List Patient Active Problem List   Diagnosis Date Noted  . Diastolic dysfunction   . DKA (diabetic ketoacidosis) (HCC) 09/20/2020  . Abnormal EKG 08/16/2020   . CKD (chronic kidney disease) stage 2, GFR 60-89 ml/min 06/27/2020  . Monitoring for anticoagulant use 06/27/2020  . Lower extremity edema 05/16/2020  . Paroxysmal atrial fibrillation (HCC) 10/26/2019  . Coagulopathy (HCC) 06/12/2019  . Gastroesophageal reflux disease   . Hemiplegia and hemiparesis following cerebral infarction affecting left non-dominant side (HCC) 11/25/2018  . RBBB 10/15/2018  . Dysphagia, post-stroke   . Microalbuminuria 07/29/2018  . Hyperlipidemia LDL goal <70 02/11/2017  . Recurrent strokes (HCC) 11/10/2016  . Essential hypertension 11/10/2016  . Tobacco abuse 08/28/2013  . Peripheral neuropathy 08/28/2013   Cammie Mcgee, PT, DPT # [phone] 10/11/2020, 8:42 PM  [facility] [address] [place], [place], [address] Phone: [phone]   Fax:  [phone]  Name: Evan JIVIDEN Sr. MRN: [id number] Date of Birth: January 17, 1956

## 2020-10-11 ENCOUNTER — Other Ambulatory Visit: Payer: Self-pay

## 2020-10-11 MED ORDER — ATORVASTATIN CALCIUM 80 MG PO TABS: 80.0000 mg | ORAL_TABLET | Freq: Every day | ORAL | 1 refills | Status: DC

## 2020-10-14 ENCOUNTER — Other Ambulatory Visit: Payer: Self-pay

## 2020-10-15 ENCOUNTER — Other Ambulatory Visit: Payer: Self-pay

## 2020-10-15 ENCOUNTER — Encounter: Payer: Self-pay | Admitting: Physical Therapy

## 2020-10-15 ENCOUNTER — Ambulatory Visit: Payer: Medicaid Other | Attending: Internal Medicine | Admitting: Physical Therapy

## 2020-10-15 DIAGNOSIS — M79602 Pain in left arm: Secondary | ICD-10-CM | POA: Insufficient documentation

## 2020-10-15 DIAGNOSIS — Z9181 History of falling: Secondary | ICD-10-CM | POA: Insufficient documentation

## 2020-10-15 DIAGNOSIS — R262 Difficulty in walking, not elsewhere classified: Secondary | ICD-10-CM | POA: Insufficient documentation

## 2020-10-15 DIAGNOSIS — R2689 Other abnormalities of gait and mobility: Secondary | ICD-10-CM | POA: Diagnosis not present

## 2020-10-15 DIAGNOSIS — R278 Other lack of coordination: Secondary | ICD-10-CM | POA: Diagnosis not present

## 2020-10-15 DIAGNOSIS — I69354 Hemiplegia and hemiparesis following cerebral infarction affecting left non-dominant side: Secondary | ICD-10-CM | POA: Insufficient documentation

## 2020-10-15 DIAGNOSIS — M6281 Muscle weakness (generalized): Secondary | ICD-10-CM | POA: Insufficient documentation

## 2020-10-15 DIAGNOSIS — R2681 Unsteadiness on feet: Secondary | ICD-10-CM | POA: Diagnosis not present

## 2020-10-15 NOTE — Therapy (Incomplete)
[facility] [address] [place], [place], [address] Phone: [phone]   Fax:  [phone]  Physical Therapy Treatment  Patient Details  Name: Evan BRADLY Sr. MRN: [id number] Date of Birth: 03/27/1956 Referring Provider (PT): Welford Roche MD   Encounter Date: 10/15/2020    Past Medical History:  Diagnosis Date  . Carotid arterial disease (HCC)    a. 08/2018 Carotid U/S: min-mod RICA atherosclerosis w/o hemodynamically significant stenosis. Nl LICA.  . Diabetes 1.5, managed as type 2 (HCC)   . Diastolic dysfunction    a. 08/2018 Echo: EF 65%. No rwma. Gr1 DD. Mild MR.  . Diastolic dysfunction    a. 08/2019 Echo: EF 55-60%, Gr1 DD. No rwma. Mild MR. RVSP 37.80mmHg.  Marland Kitchen Hypercholesterolemia   . Hypertension   . PAF (paroxysmal atrial fibrillation) (HCC)    a. 10/2019 Event Monitor: PAF; b. CHA2DS2VASc = 5-->Eliquis.  Marland Kitchen Poorly controlled diabetes mellitus (HCC)    a. 04/2019 A1c 13.8.  Marland Kitchen Recurrent strokes (HCC)    a. 10/2016 MRI/A: Acute 22mm R thalamic infarct, ? subacute infarct of R corona radiata; b. 08/2017 MRI/A: Acute 34mm lateral L thalamic infarct. Other more remote lacunar infarcts of thalami bilat. Small vessel dzs; c. 08/2018 MRI/A: Acute lacunar infarct of the post limb of R internal capsule; d. 08/2019 MRI Acute CVA of L paramedian pons adn R cerebellar hemisphere.  . Tobacco abuse     Past Surgical History:  Procedure Laterality Date  . ESOPHAGOGASTRODUODENOSCOPY (EGD) WITH PROPOFOL N/A 04/26/2019   Procedure: ESOPHAGOGASTRODUODENOSCOPY (EGD) WITH PROPOFOL;  Surgeon: Toney Reil, MD;  Location: [facility] ENDOSCOPY;  Service: Gastroenterology;  Laterality: N/A;    There were no vitals filed for this visit.               There.ex.:  Nustep L410 min. B UE/LE (focus on L elbow extension)- consistent cadence/ no rest breaks.  Seated L elbow A/PROM and supine/seated wand ex. (flexion/ chest press/  abduction)- 20x  Seated resisted chest press 20x. With wand.   Supine SAQ/bolster bridging 20x each.  (cuing to slow down).  STM to L shoulder/distal biceps during stretches  Seated marching/ LAQ/ knee flexion/ standing hip flexion/ abduction/ extension 20x each (at //-bars).  STS from blue mat table (varying heights)- instruction in proper technique/ wt. Shifting to L.     Neuro.mm.:  Walking in //-bars forward/ lateral walking 4x each  Walking in PT clinic working on consistent L hip flexion/ step length  Standing wt. Shifting in front of blue table (CGA for safety).    Posture correction in sitting/ standing (cuing to prevent posterior leaning).      PT Long Term Goals - 09/23/20 0758      PT LONG TERM GOAL #1   Title Pt will increase Berg Balance to 48/56 to demonstrate clinically significant improvement in balance decreasing pt's risk for falls during functional activities.    Baseline 2/3: 42/56    Time 12    Period Weeks    Status New    Target Date 12/12/20      PT LONG TERM GOAL #2   Title Pt will improve 10 m gait speed to > 0.5 m/s  with LRAD to improve safe community ambulation.    Baseline 2/3: 0.32 m/s with SPC and SBA/CGA    Time 12    Period Weeks    Status New    Target Date 12/12/20      PT  LONG TERM GOAL #3   Title Pt will improve 5xSTS to < 12 sec with no RUE support to demonstrate clinically significant improvement in BLE strength to improve STS t/f's like standing from the toilet.    Baseline 2/3: 13.5 sec.    Time 12    Period Weeks    Status New    Target Date 12/12/20      PT LONG TERM GOAL #4   Title Pt will improve DGI by a minimum of 5 points to demonstrate clinically significant improvement and safety with community ambulation tasks to decrease risk of falls.    Baseline TBD    Time 12    Period Weeks    Status New    Target Date 12/12/20      PT LONG TERM GOAL #5   Title Pt will improve FOTO score to target amount to  demonstrate perceived improvements in ability to complete ADL's.    Baseline 10/20: Perform next session.    Time 12    Period Weeks    Status New    Target Date 12/12/20                  Patient will benefit from skilled therapeutic intervention in order to improve the following deficits and impairments:     Visit Diagnosis: No diagnosis found.     Problem List Patient Active Problem List   Diagnosis Date Noted  . Diastolic dysfunction   . DKA (diabetic ketoacidosis) (HCC) 09/20/2020  . Abnormal EKG 08/16/2020  . CKD (chronic kidney disease) stage 2, GFR 60-89 ml/min 06/27/2020  . Monitoring for anticoagulant use 06/27/2020  . Lower extremity edema 05/16/2020  . Paroxysmal atrial fibrillation (HCC) 10/26/2019  . Coagulopathy (HCC) 06/12/2019  . Gastroesophageal reflux disease   . Hemiplegia and hemiparesis following cerebral infarction affecting left non-dominant side (HCC) 11/25/2018  . RBBB 10/15/2018  . Dysphagia, post-stroke   . Microalbuminuria 07/29/2018  . Hyperlipidemia LDL goal <70 02/11/2017  . Recurrent strokes (HCC) 11/10/2016  . Essential hypertension 11/10/2016  . Tobacco abuse 08/28/2013  . Peripheral neuropathy 08/28/2013    Cammie Mcgee 10/15/2020, 2:20 PM  [facility] [address] [place], [place], [address] Phone: [phone]   Fax:  [phone]  Name: Evan Ano Sr. MRN: [id number] Date of Birth: 08-15-1956

## 2020-10-16 ENCOUNTER — Ambulatory Visit: Payer: Medicaid Other | Admitting: Family Medicine

## 2020-10-16 NOTE — Progress Notes (Deleted)
    SUBJECTIVE:   CHIEF COMPLAINT / HPI:   Hypertension: - Medications: lisinopril 40mg , HCTZ 25mg , amlodipine 10mg  - Compliance: *** - Checking BP at home: *** - Denies any SOB, CP, vision changes, LE edema, medication SEs, or symptoms of hypotension - Diet: *** - Exercise: ***  Diabetes, Type 2 - recent admission for DKA 2/4-2/5 - Last A1c 8.4 09/20/20 - Medications: farxiga 10mg , levemir 35u daily - Compliance: *** - Checking BG at home: *** - Diet: *** - Exercise: *** - Eye exam: due - Foot exam: UTD - Statin: yes - Denies symptoms of hypoglycemia, polyuria, polydipsia, numbness extremities, foot ulcers/trauma   OBJECTIVE:   There were no vitals taken for this visit.  ***  ASSESSMENT/PLAN:   No problem-specific Assessment & Plan notes found for this encounter.     , DO [place] [facility]

## 2020-10-17 ENCOUNTER — Other Ambulatory Visit: Payer: Self-pay

## 2020-10-17 ENCOUNTER — Encounter: Payer: Self-pay | Admitting: Physical Therapy

## 2020-10-17 ENCOUNTER — Ambulatory Visit: Payer: Medicaid Other | Admitting: Physical Therapy

## 2020-10-17 DIAGNOSIS — R2689 Other abnormalities of gait and mobility: Secondary | ICD-10-CM | POA: Diagnosis not present

## 2020-10-17 DIAGNOSIS — R262 Difficulty in walking, not elsewhere classified: Secondary | ICD-10-CM

## 2020-10-17 DIAGNOSIS — Z9181 History of falling: Secondary | ICD-10-CM | POA: Diagnosis not present

## 2020-10-17 DIAGNOSIS — M79602 Pain in left arm: Secondary | ICD-10-CM | POA: Diagnosis not present

## 2020-10-17 DIAGNOSIS — M6281 Muscle weakness (generalized): Secondary | ICD-10-CM

## 2020-10-17 DIAGNOSIS — R2681 Unsteadiness on feet: Secondary | ICD-10-CM

## 2020-10-17 DIAGNOSIS — R278 Other lack of coordination: Secondary | ICD-10-CM | POA: Diagnosis not present

## 2020-10-17 DIAGNOSIS — I69354 Hemiplegia and hemiparesis following cerebral infarction affecting left non-dominant side: Secondary | ICD-10-CM

## 2020-10-17 NOTE — Therapy (Signed)
[facility] [address] [place], [place], [address] Phone: [phone]   Fax:  [phone]  Physical Therapy Treatment  Patient Details  Name: Evan FLURY Sr. MRN: [id number] Date of Birth: 1956-02-07 Referring Provider (PT): Welford Roche MD   Encounter Date: 10/17/2020   PT End of Session - 10/17/20 1412    Visit Number 6    Number of Visits 24    Date for PT Re-Evaluation 12/12/20    Authorization - Visit Number 6    Authorization - Number of Visits 10    PT Start Time 1412    PT Stop Time 1519    PT Time Calculation (min) 67 min    Equipment Utilized During Treatment Gait belt    Activity Tolerance Patient tolerated treatment well    Behavior During Therapy WFL for tasks assessed/performed           Past Medical History:  Diagnosis Date  . Carotid arterial disease (HCC)    a. 08/2018 Carotid U/S: min-mod RICA atherosclerosis w/o hemodynamically significant stenosis. Nl LICA.  . Diabetes 1.5, managed as type 2 (HCC)   . Diastolic dysfunction    a. 08/2018 Echo: EF 65%. No rwma. Gr1 DD. Mild MR.  . Diastolic dysfunction    a. 08/2019 Echo: EF 55-60%, Gr1 DD. No rwma. Mild MR. RVSP 37.36mmHg.  Marland Kitchen Hypercholesterolemia   . Hypertension   . PAF (paroxysmal atrial fibrillation) (HCC)    a. 10/2019 Event Monitor: PAF; b. CHA2DS2VASc = 5-->Eliquis.  Marland Kitchen Poorly controlled diabetes mellitus (HCC)    a. 04/2019 A1c 13.8.  Marland Kitchen Recurrent strokes (HCC)    a. 10/2016 MRI/A: Acute 99mm R thalamic infarct, ? subacute infarct of R corona radiata; b. 08/2017 MRI/A: Acute 57mm lateral L thalamic infarct. Other more remote lacunar infarcts of thalami bilat. Small vessel dzs; c. 08/2018 MRI/A: Acute lacunar infarct of the post limb of R internal capsule; d. 08/2019 MRI Acute CVA of L paramedian pons adn R cerebellar hemisphere.  . Tobacco abuse     Past Surgical History:  Procedure Laterality Date  . ESOPHAGOGASTRODUODENOSCOPY (EGD) WITH  PROPOFOL N/A 04/26/2019   Procedure: ESOPHAGOGASTRODUODENOSCOPY (EGD) WITH PROPOFOL;  Surgeon: Toney Reil, MD;  Location: [facility] ENDOSCOPY;  Service: Gastroenterology;  Laterality: N/A;    There were no vitals filed for this visit.   Subjective Assessment - 10/17/20 1412    Subjective No new complaints.  No reports of L shoulder/elbow pain.    Pertinent History Pertinent history and PMH: HTN, uncontrolled DM, HLD, R basal ganglia stroke on 08/2018 and L pointine and R cerebellar stroke 09/07/19, sinus bradycardia, peripheral neuropat    Patient Stated Goals Improve strengthening/ walking.  Return to fishing.    Currently in Pain? No/denies    Pain Onset More than a month ago            MH to L shoulder/elbow prior to tx. (discussed activity today).     There.ex.:  Nustep L410 min. B UE/LE (focus on L elbow extension)- consistent cadence/ no rest breaks.  Pt. Maintains L hand grasp during entire 10 min.   Supine L elbow/shoulder/LE stretches (all planes)- static holds.  Supine B UE wand ex. (chest press/ flexion/ abduction/ ER)- 20x each with PT assist.   Supine marching/ heel slides/ SLR 20x each.   Step ups/down at 6" step (focus on L quad control/ unable to prevent L knee recurvatum).    Standing 4-way hip ex. At //-  bars 20x each.   Seated marching/ LAQ/ knee flexion/ standing hip flexion/ abduction/ extension 20x each (at //-bars).     Neuro.mm.:  Walking in //-bars forward/ lateral walking 4x each  Walking in PT clinic working on consistent L hip flexion/ step length  Posture correction in sitting/ standing (cuing to prevent posterior leaning).  Walking outside on varying terrain with QC and SBA/CGA for safety/ verbal cuing.  No LOB but challenged on uneven sidewalk     PT Long Term Goals - 09/23/20 0758      PT LONG TERM GOAL #1   Title Pt will increase Berg Balance to 48/56 to demonstrate clinically significant improvement in balance decreasing  pt's risk for falls during functional activities.    Baseline 2/3: 42/56    Time 12    Period Weeks    Status New    Target Date 12/12/20      PT LONG TERM GOAL #2   Title Pt will improve 10 m gait speed to > 0.5 m/s  with LRAD to improve safe community ambulation.    Baseline 2/3: 0.32 m/s with SPC and SBA/CGA    Time 12    Period Weeks    Status New    Target Date 12/12/20      PT LONG TERM GOAL #3   Title Pt will improve 5xSTS to < 12 sec with no RUE support to demonstrate clinically significant improvement in BLE strength to improve STS t/f's like standing from the toilet.    Baseline 2/3: 13.5 sec.    Time 12    Period Weeks    Status New    Target Date 12/12/20      PT LONG TERM GOAL #4   Title Pt will improve DGI by a minimum of 5 points to demonstrate clinically significant improvement and safety with community ambulation tasks to decrease risk of falls.    Baseline TBD    Time 12    Period Weeks    Status New    Target Date 12/12/20      PT LONG TERM GOAL #5   Title Pt will improve FOTO score to target amount to demonstrate perceived improvements in ability to complete ADL's.    Baseline 10/20: Perform next session.    Time 12    Period Weeks    Status New    Target Date 12/12/20                 Plan - 10/17/20 1412    Clinical Impression Statement PT focus on L quad control to prevent knee hyperextension with wt.bearing/ gait.  Increase walking distance in clinic/outside today while using QC with minimal cuing to increase L hip flexoin/ step length/ heel strike.  L shoulder/elbow joint stiffness limited arm swing and UE reaching tasks.  Pt. able to maintain L hand grasp on Nustep and during UE AAROM with wand.  No increase c/o pain.    Personal Factors and Comorbidities Age;Comorbidity 3+;Education;Finances;Past/Current Experience;Fitness;Transportation    Comorbidities HTN, uncontrolled DM, HLD, R basal ganglia stroke on 08/2018 and L pointine and R  cerebellar stroke 09/07/19, sinus bradycardia, peripheral neuropathy    Examination-Activity Limitations Bathing;Bed Mobility;Dressing;Transfers;Squat;Lift;Locomotion Level;Stairs;Reach Overhead;Carry;Stand;Sit    Examination-Participation Restrictions Community Activity;Interpersonal Relationship;Yard Work    Conservation officer, historic buildings Evolving/Moderate complexity    Clinical Decision Making Moderate    Rehab Potential Fair    PT Frequency 2x / week    PT Duration 12 weeks    PT  Treatment/Interventions ADLs/Self Care Home Management;Aquatic Therapy;Electrical Stimulation;Moist Heat;DME Instruction;Gait training;Stair training;Functional mobility training;Therapeutic activities;Therapeutic exercise;Balance training;Neuromuscular re-education;Patient/family education;Orthotic Fit/Training;Dry needling;Manual techniques;Canalith Repostioning;Passive range of motion;Biofeedback    PT Next Visit Plan L UE/LE strengthening/ dynamic balance tasks.    PT Home Exercise Plan HEP: standing hip marches/abd, ankle DF/PF in sitting/supine, passive L elbow stretch for tone    Consulted and Agree with Plan of Care Patient    Family Member Consulted wife           Patient will benefit from skilled therapeutic intervention in order to improve the following deficits and impairments:  Abnormal gait,Decreased coordination,Decreased range of motion,Difficulty walking,Impaired tone,Decreased endurance,Impaired UE functional use,Decreased activity tolerance,Impaired perceived functional ability,Decreased balance,Impaired flexibility,Decreased cognition,Decreased mobility,Decreased strength,Increased edema,Postural dysfunction,Pain  Visit Diagnosis: Hemiplegia and hemiparesis following cerebral infarction affecting left non-dominant side (HCC)  Difficulty in walking, not elsewhere classified  At risk for falls  Muscle weakness (generalized)  Pain in left arm  Unsteadiness on feet  Other abnormalities  of gait and mobility     Problem List Patient Active Problem List   Diagnosis Date Noted  . Diastolic dysfunction   . DKA (diabetic ketoacidosis) (HCC) 09/20/2020  . Abnormal EKG 08/16/2020  . CKD (chronic kidney disease) stage 2, GFR 60-89 ml/min 06/27/2020  . Monitoring for anticoagulant use 06/27/2020  . Lower extremity edema 05/16/2020  . Paroxysmal atrial fibrillation (HCC) 10/26/2019  . Coagulopathy (HCC) 06/12/2019  . Gastroesophageal reflux disease   . Hemiplegia and hemiparesis following cerebral infarction affecting left non-dominant side (HCC) 11/25/2018  . RBBB 10/15/2018  . Dysphagia, post-stroke   . Microalbuminuria 07/29/2018  . Hyperlipidemia LDL goal <70 02/11/2017  . Recurrent strokes (HCC) 11/10/2016  . Essential hypertension 11/10/2016  . Tobacco abuse 08/28/2013  . Peripheral neuropathy 08/28/2013   Cammie Mcgee, PT, DPT # [phone] 10/18/2020, 3:23 PM  [facility] [address] [place], [place], [address] Phone: [phone]   Fax:  [phone]  Name: JOSECARLOS HARRIOTT Sr. MRN: [id number] Date of Birth: 10-25-1955

## 2020-10-17 NOTE — Telephone Encounter (Signed)
Called pharmacy last rx prescribed was omeprazole

## 2020-10-17 NOTE — Therapy (Signed)
[facility] [address] [place], [place], [address] Phone: [phone]   Fax:  [phone]  Physical Therapy Treatment  Patient Details  Name: Evan MORELOS Sr. MRN: [id number] Date of Birth: 02-01-1956 Referring Provider (PT): Welford Roche MD   Encounter Date: 10/15/2020   PT End of Session - 10/17/20 1337    Visit Number 5    Number of Visits 24    Date for PT Re-Evaluation 12/12/20    Authorization - Visit Number 5    Authorization - Number of Visits 10    PT Start Time 1404    PT Stop Time 1507    PT Time Calculation (min) 63 min    Equipment Utilized During Treatment Gait belt    Activity Tolerance Patient tolerated treatment well    Behavior During Therapy WFL for tasks assessed/performed           Past Medical History:  Diagnosis Date  . Carotid arterial disease (HCC)    a. 08/2018 Carotid U/S: min-mod RICA atherosclerosis w/o hemodynamically significant stenosis. Nl LICA.  . Diabetes 1.5, managed as type 2 (HCC)   . Diastolic dysfunction    a. 08/2018 Echo: EF 65%. No rwma. Gr1 DD. Mild MR.  . Diastolic dysfunction    a. 08/2019 Echo: EF 55-60%, Gr1 DD. No rwma. Mild MR. RVSP 37.32mmHg.  Marland Kitchen Hypercholesterolemia   . Hypertension   . PAF (paroxysmal atrial fibrillation) (HCC)    a. 10/2019 Event Monitor: PAF; b. CHA2DS2VASc = 5-->Eliquis.  Marland Kitchen Poorly controlled diabetes mellitus (HCC)    a. 04/2019 A1c 13.8.  Marland Kitchen Recurrent strokes (HCC)    a. 10/2016 MRI/A: Acute 4mm R thalamic infarct, ? subacute infarct of R corona radiata; b. 08/2017 MRI/A: Acute 74mm lateral L thalamic infarct. Other more remote lacunar infarcts of thalami bilat. Small vessel dzs; c. 08/2018 MRI/A: Acute lacunar infarct of the post limb of R internal capsule; d. 08/2019 MRI Acute CVA of L paramedian pons adn R cerebellar hemisphere.  . Tobacco abuse     Past Surgical History:  Procedure Laterality Date  . ESOPHAGOGASTRODUODENOSCOPY (EGD) WITH  PROPOFOL N/A 04/26/2019   Procedure: ESOPHAGOGASTRODUODENOSCOPY (EGD) WITH PROPOFOL;  Surgeon: Toney Reil, MD;  Location: [facility] ENDOSCOPY;  Service: Gastroenterology;  Laterality: N/A;    There were no vitals filed for this visit.   Subjective Assessment - 10/16/20 1445    Subjective Pt. arrived to clinic with no new complaints.  Pt. using QC and cued to increase step pattern/length while maintaing proper BOS.    Pertinent History Pertinent history and PMH: HTN, uncontrolled DM, HLD, R basal ganglia stroke on 08/2018 and L pointine and R cerebellar stroke 09/07/19, sinus bradycardia, peripheral neuropat    Patient Stated Goals Improve strengthening/ walking.  Return to fishing.    Currently in Pain? No/denies    Pain Onset More than a month ago             There.ex.:  Nustep L410 min. B UE/LE (focus on L elbow extension)- consistent cadence/ no rest breaks.  Seated L elbow A/PROM and supine/seated wand ex. (flexion/ chest press/ abduction)- 20x  Seated resisted chest press 20x. With wand.   Supine SAQ/bolster bridging 20x each.  (cuing to slow down).    STM to L shoulder/distal biceps during elbow and sh. Stretches.  Mirror feedback  Seated marching/ LAQ/ knee flexion/ standing hip flexion/ abduction/ extension 20x each (at //-bars).  Discussed current walking/ HEP.  Neuro.mm.:  Walking in //-bars forward/ lateral walking 4x each  Walking in PT clinic working on consistent L hip flexion/ step length  Standing wt. Shifting in front of blue table (CGA for safety).    Posture correction in sitting/ standing (cuing to prevent posterior leaning).     PT Long Term Goals - 09/23/20 0758      PT LONG TERM GOAL #1   Title Pt will increase Berg Balance to 48/56 to demonstrate clinically significant improvement in balance decreasing pt's risk for falls during functional activities.    Baseline 2/3: 42/56    Time 12    Period Weeks    Status New    Target Date  12/12/20      PT LONG TERM GOAL #2   Title Pt will improve 10 m gait speed to > 0.5 m/s  with LRAD to improve safe community ambulation.    Baseline 2/3: 0.32 m/s with SPC and SBA/CGA    Time 12    Period Weeks    Status New    Target Date 12/12/20      PT LONG TERM GOAL #3   Title Pt will improve 5xSTS to < 12 sec with no RUE support to demonstrate clinically significant improvement in BLE strength to improve STS t/f's like standing from the toilet.    Baseline 2/3: 13.5 sec.    Time 12    Period Weeks    Status New    Target Date 12/12/20      PT LONG TERM GOAL #4   Title Pt will improve DGI by a minimum of 5 points to demonstrate clinically significant improvement and safety with community ambulation tasks to decrease risk of falls.    Baseline TBD    Time 12    Period Weeks    Status New    Target Date 12/12/20      PT LONG TERM GOAL #5   Title Pt will improve FOTO score to target amount to demonstrate perceived improvements in ability to complete ADL's.    Baseline 10/20: Perform next session.    Time 12    Period Weeks    Status New    Target Date 12/12/20                 Plan - 10/17/20 1338    Clinical Impression Statement Pt. works really hard during tx. session and is focused on increasing L elbow/shoulder and LE ROM/ strengthening.  No LOB during gait in //-bars/ clinic but focus and extra time required.  Pt. fatigues with prolonged standing/ resisted ther.ex. at //-bars but recovers quickly with short seated rest break.  No change to HEP and pt. instructed to increase walking distance.    Personal Factors and Comorbidities Age;Comorbidity 3+;Education;Finances;Past/Current Experience;Fitness;Transportation    Comorbidities HTN, uncontrolled DM, HLD, R basal ganglia stroke on 08/2018 and L pointine and R cerebellar stroke 09/07/19, sinus bradycardia, peripheral neuropathy    Examination-Activity Limitations Bathing;Bed  Mobility;Dressing;Transfers;Squat;Lift;Locomotion Level;Stairs;Reach Overhead;Carry;Stand;Sit    Examination-Participation Restrictions Community Activity;Interpersonal Relationship;Yard Work    Conservation officer, historic buildings Evolving/Moderate complexity    Clinical Decision Making Moderate    Rehab Potential Fair    PT Frequency 2x / week    PT Duration 12 weeks    PT Treatment/Interventions ADLs/Self Care Home Management;Aquatic Therapy;Electrical Stimulation;Moist Heat;DME Instruction;Gait training;Stair training;Functional mobility training;Therapeutic activities;Therapeutic exercise;Balance training;Neuromuscular re-education;Patient/family education;Orthotic Fit/Training;Dry needling;Manual techniques;Canalith Repostioning;Passive range of motion;Biofeedback    PT Next Visit Plan Issue HEP/ balance training.  PT Home Exercise Plan HEP: standing hip marches/abd, ankle DF/PF in sitting/supine, passive L elbow stretch for tone    Consulted and Agree with Plan of Care Patient    Family Member Consulted wife           Patient will benefit from skilled therapeutic intervention in order to improve the following deficits and impairments:  Abnormal gait,Decreased coordination,Decreased range of motion,Difficulty walking,Impaired tone,Decreased endurance,Impaired UE functional use,Decreased activity tolerance,Impaired perceived functional ability,Decreased balance,Impaired flexibility,Decreased cognition,Decreased mobility,Decreased strength,Increased edema,Postural dysfunction,Pain  Visit Diagnosis: Hemiplegia and hemiparesis following cerebral infarction affecting left non-dominant side (HCC)  Difficulty in walking, not elsewhere classified  At risk for falls  Muscle weakness (generalized)  Pain in left arm  Unsteadiness on feet     Problem List Patient Active Problem List   Diagnosis Date Noted  . Diastolic dysfunction   . DKA (diabetic ketoacidosis) (HCC) 09/20/2020  .  Abnormal EKG 08/16/2020  . CKD (chronic kidney disease) stage 2, GFR 60-89 ml/min 06/27/2020  . Monitoring for anticoagulant use 06/27/2020  . Lower extremity edema 05/16/2020  . Paroxysmal atrial fibrillation (HCC) 10/26/2019  . Coagulopathy (HCC) 06/12/2019  . Gastroesophageal reflux disease   . Hemiplegia and hemiparesis following cerebral infarction affecting left non-dominant side (HCC) 11/25/2018  . RBBB 10/15/2018  . Dysphagia, post-stroke   . Microalbuminuria 07/29/2018  . Hyperlipidemia LDL goal <70 02/11/2017  . Recurrent strokes (HCC) 11/10/2016  . Essential hypertension 11/10/2016  . Tobacco abuse 08/28/2013  . Peripheral neuropathy 08/28/2013   Cammie Mcgee, PT, DPT # [phone] 10/17/2020, 2:08 PM  [facility] [address] [place], [place], [address] Phone: [phone]   Fax:  [phone]  Name: Evan Ano Sr. MRN: [id number] Date of Birth: 03/24/1956

## 2020-10-21 ENCOUNTER — Other Ambulatory Visit: Payer: Self-pay

## 2020-10-21 NOTE — Telephone Encounter (Signed)
Pharmacy called pt is requesting refill on protonix

## 2020-10-22 ENCOUNTER — Ambulatory Visit: Payer: Medicaid Other | Admitting: Physical Therapy

## 2020-10-24 ENCOUNTER — Ambulatory Visit: Payer: Medicaid Other | Admitting: Physical Therapy

## 2020-10-24 ENCOUNTER — Other Ambulatory Visit: Payer: Self-pay

## 2020-10-24 MED ORDER — INSULIN PEN NEEDLE 32G X 6 MM MISC: 3 refills | Status: DC

## 2020-10-28 ENCOUNTER — Other Ambulatory Visit: Payer: Self-pay

## 2020-10-28 ENCOUNTER — Ambulatory Visit (INDEPENDENT_AMBULATORY_CARE_PROVIDER_SITE_OTHER): Payer: Medicaid Other | Admitting: Family Medicine

## 2020-10-28 ENCOUNTER — Encounter: Payer: Self-pay | Admitting: Family Medicine

## 2020-10-28 VITALS — BP 120/78 | HR 73 | Temp 97.5°F | Resp 16 | Ht 66.0 in | Wt 162.3 lb

## 2020-10-28 DIAGNOSIS — Z794 Long term (current) use of insulin: Secondary | ICD-10-CM | POA: Diagnosis not present

## 2020-10-28 DIAGNOSIS — E1159 Type 2 diabetes mellitus with other circulatory complications: Secondary | ICD-10-CM | POA: Diagnosis not present

## 2020-10-28 DIAGNOSIS — I10 Essential (primary) hypertension: Secondary | ICD-10-CM

## 2020-10-28 DIAGNOSIS — E1165 Type 2 diabetes mellitus with hyperglycemia: Secondary | ICD-10-CM

## 2020-10-28 MED ORDER — DAPAGLIFLOZIN PROPANEDIOL 10 MG PO TABS: 10.0000 mg | ORAL_TABLET | Freq: Every day | ORAL | 1 refills | Status: DC

## 2020-10-28 NOTE — Progress Notes (Unsigned)
    SUBJECTIVE:   CHIEF COMPLAINT / HPI:   Hypertension: - Medications: amlodipine 10mg , HCTZ 25mg , lisinopril 40mg  - Compliance: good - Checking BP at home: no - Denies any SOB, CP, vision changes, LE edema, medication SEs, or symptoms of hypotension  Diabetes, Type 2 - seen previously 2/10 for hospital f/u for DKA - Last A1c 8.4 09/20/20 - Medications: farxiga 10mg  (started at last visit in place of glipizide d/t hypoglycemia), levemir 35u daily, glucagon - Compliance: couldn't get the farxiga from the pharmacy. - Checking BG at home: yes, 100-140s. Usually 130s - Eye exam: due - Foot exam: UTD - Statin: yes - Denies symptoms of hypoglycemia, polyuria, polydipsia, numbness extremities, foot ulcers/trauma   OBJECTIVE:   BP 120/78   Pulse 73   Temp (!) 97.5 F (36.4 C) (Oral)   Resp 16   Ht 5\' 6"  (1.676 m)   Wt 162 lb 4.8 oz (73.6 kg)   SpO2 98%   BMI 26.20 kg/m   Gen: well appearing, in NAD Card: RRR Lungs: CTAB Ext: WWP, no edema   ASSESSMENT/PLAN:   Essential hypertension Doing well on current regimen, no changes made today.  Type 2 diabetes mellitus with hyperglycemia (HCC) Doing well on levemir and meal time insulin without hypoglycemia. Will resend farxiga in hopes of more renal protection as well as eventual titration off of insulin. Referral generated for diabetic eye exam. F/u in 2 months for repeat a1c.    , DO

## 2020-10-28 NOTE — Patient Instructions (Signed)
It was great to see you!  Our plans for today:  - No changes to your medications today.  - Let us know if you have trouble getting th Farxiga from the pharmacy.  - Come back in May for his next A1c check.   Take care and seek immediate care sooner if you develop any concerns.   Dr. Linwood Dibbles

## 2020-10-29 ENCOUNTER — Encounter: Payer: Self-pay | Admitting: Physical Therapy

## 2020-10-29 ENCOUNTER — Ambulatory Visit: Payer: Medicaid Other | Admitting: Physical Therapy

## 2020-10-29 DIAGNOSIS — Z9181 History of falling: Secondary | ICD-10-CM | POA: Diagnosis not present

## 2020-10-29 DIAGNOSIS — R262 Difficulty in walking, not elsewhere classified: Secondary | ICD-10-CM | POA: Diagnosis not present

## 2020-10-29 DIAGNOSIS — E1165 Type 2 diabetes mellitus with hyperglycemia: Secondary | ICD-10-CM | POA: Insufficient documentation

## 2020-10-29 DIAGNOSIS — I69354 Hemiplegia and hemiparesis following cerebral infarction affecting left non-dominant side: Secondary | ICD-10-CM

## 2020-10-29 DIAGNOSIS — M79602 Pain in left arm: Secondary | ICD-10-CM

## 2020-10-29 DIAGNOSIS — R2681 Unsteadiness on feet: Secondary | ICD-10-CM

## 2020-10-29 DIAGNOSIS — R2689 Other abnormalities of gait and mobility: Secondary | ICD-10-CM | POA: Diagnosis not present

## 2020-10-29 DIAGNOSIS — R278 Other lack of coordination: Secondary | ICD-10-CM | POA: Diagnosis not present

## 2020-10-29 DIAGNOSIS — M6281 Muscle weakness (generalized): Secondary | ICD-10-CM | POA: Diagnosis not present

## 2020-10-29 NOTE — Therapy (Signed)
[facility] [address] [place], [place], [address] Phone: [phone]   Fax:  [phone]  Physical Therapy Treatment  Patient Details  Name: Evan PECHA Sr. MRN: [id number] Date of Birth: Jan 10, 1956 Referring Provider (PT): Welford Roche MD   Encounter Date: 10/29/2020   PT End of Session - 10/30/20 1858    Visit Number 7    Number of Visits 24    Date for PT Re-Evaluation 12/12/20    Authorization - Visit Number 7    Authorization - Number of Visits 10    PT Start Time 1416    PT Stop Time 1516    PT Time Calculation (min) 60 min    Equipment Utilized During Treatment Gait belt    Activity Tolerance Patient tolerated treatment well    Behavior During Therapy WFL for tasks assessed/performed           Past Medical History:  Diagnosis Date  . Carotid arterial disease (HCC)    a. 08/2018 Carotid U/S: min-mod RICA atherosclerosis w/o hemodynamically significant stenosis. Nl LICA.  . Diabetes 1.5, managed as type 2 (HCC)   . Diastolic dysfunction    a. 08/2018 Echo: EF 65%. No rwma. Gr1 DD. Mild MR.  . Diastolic dysfunction    a. 08/2019 Echo: EF 55-60%, Gr1 DD. No rwma. Mild MR. RVSP 37.53mmHg.  Marland Kitchen Hypercholesterolemia   . Hypertension   . PAF (paroxysmal atrial fibrillation) (HCC)    a. 10/2019 Event Monitor: PAF; b. CHA2DS2VASc = 5-->Eliquis.  Marland Kitchen Poorly controlled diabetes mellitus (HCC)    a. 04/2019 A1c 13.8.  Marland Kitchen Recurrent strokes (HCC)    a. 10/2016 MRI/A: Acute 36mm R thalamic infarct, ? subacute infarct of R corona radiata; b. 08/2017 MRI/A: Acute 83mm lateral L thalamic infarct. Other more remote lacunar infarcts of thalami bilat. Small vessel dzs; c. 08/2018 MRI/A: Acute lacunar infarct of the post limb of R internal capsule; d. 08/2019 MRI Acute CVA of L paramedian pons adn R cerebellar hemisphere.  . Tobacco abuse     Past Surgical History:  Procedure Laterality Date  . ESOPHAGOGASTRODUODENOSCOPY (EGD) WITH  PROPOFOL N/A 04/26/2019   Procedure: ESOPHAGOGASTRODUODENOSCOPY (EGD) WITH PROPOFOL;  Surgeon: Toney Reil, MD;  Location: [facility] ENDOSCOPY;  Service: Gastroenterology;  Laterality: N/A;    There were no vitals filed for this visit.   Subjective Assessment - 10/30/20 1850    Subjective Pt. entered PT with L elbow flexed posture/ hip hike and states he missed PT last week due to transportation issues.  Pt. arrived early and sat in waiting room (PT applied MH to L shoulder/ elbow).    Pertinent History Pertinent history and PMH: HTN, uncontrolled DM, HLD, R basal ganglia stroke on 08/2018 and L pointine and R cerebellar stroke 09/07/19, sinus bradycardia, peripheral neuropat    Patient Stated Goals Improve strengthening/ walking.  Return to fishing.    Currently in Pain? Yes    Pain Score 8     Pain Location Hand    Pain Onset More than a month ago             MH to L shoulder/elbow prior to tx. (discussed activity today)- pt. Reports he missed PT last week due to transportation issues.     There.ex.:  Supine L elbow/shoulder/LE stretches (all planes)- static holds.  Supine B UE wand ex. (chest press/ flexion/ abduction/ ER)- 20x each with PT assist.  Supine L hip/ hamstring stretches 3x each.  Supine marching/ SLR/ bridging 10x2 each.   Nustep L410 min. B UE/LE (focus on L elbow extension)- consistent cadence/ no rest breaks.  Pt. Maintains L hand grasp during entire 10 min.   Seated marching/ LAQ/ knee flexion/ standing hip flexion/ abduction/ extension20x each (at //-bars).  Step ups/down at 6" step (focus on L quad control/ unable to prevent L knee recurvatum).  Added 5# wrist wt. To L to promote elbow extension.      Neuro.mm.:  Walking in //-barsforward/ lateral walking4x each  Walking in PT clinic working on consistent L hip flexion/ step length. Working on [employer] preventing hyperextension (difficulty, esp. With step ups).   Posture  correction in sitting/ standing (cuing to prevent posterior leaning).  Wt. Shifting in //-bars with no UE assist.        PT Long Term Goals - 09/23/20 0758      PT LONG TERM GOAL #1   Title Pt will increase Berg Balance to 48/56 to demonstrate clinically significant improvement in balance decreasing pt's risk for falls during functional activities.    Baseline 2/3: 42/56    Time 12    Period Weeks    Status New    Target Date 12/12/20      PT LONG TERM GOAL #2   Title Pt will improve 10 m gait speed to > 0.5 m/s  with LRAD to improve safe community ambulation.    Baseline 2/3: 0.32 m/s with SPC and SBA/CGA    Time 12    Period Weeks    Status New    Target Date 12/12/20      PT LONG TERM GOAL #3   Title Pt will improve 5xSTS to < 12 sec with no RUE support to demonstrate clinically significant improvement in BLE strength to improve STS t/f's like standing from the toilet.    Baseline 2/3: 13.5 sec.    Time 12    Period Weeks    Status New    Target Date 12/12/20      PT LONG TERM GOAL #4   Title Pt will improve DGI by a minimum of 5 points to demonstrate clinically significant improvement and safety with community ambulation tasks to decrease risk of falls.    Baseline TBD    Time 12    Period Weeks    Status New    Target Date 12/12/20      PT LONG TERM GOAL #5   Title Pt will improve FOTO score to target amount to demonstrate perceived improvements in ability to complete ADL's.    Baseline 10/20: Perform next session.    Time 12    Period Weeks    Status New    Target Date 12/12/20                 Plan - 10/30/20 1859    Clinical Impression Statement Pt. requires extra time to ambulate safely around clinic with use of QC.  Hip hike noted during swing through gait and occasional cuing for heel strike.  Limited quad control during step ups to prevent L knee hyperextension.   L elbow extension remains limited and PT attempted to apply 5# wrist weight during  standing ex.    Personal Factors and Comorbidities Age;Comorbidity 3+;Education;Finances;Past/Current Experience;Fitness;Transportation    Comorbidities HTN, uncontrolled DM, HLD, R basal ganglia stroke on 08/2018 and L pointine and R cerebellar stroke 09/07/19, sinus bradycardia, peripheral neuropathy    Examination-Activity Limitations Bathing;Bed Mobility;Dressing;Transfers;Squat;Lift;Locomotion Level;Stairs;Reach Overhead;Carry;Stand;Sit  Examination-Participation Restrictions Community Activity;Interpersonal Relationship;Yard Work    Conservation officer, historic buildings Evolving/Moderate complexity    Clinical Decision Making Moderate    Rehab Potential Fair    PT Frequency 2x / week    PT Duration 12 weeks    PT Treatment/Interventions ADLs/Self Care Home Management;Aquatic Therapy;Electrical Stimulation;Moist Heat;DME Instruction;Gait training;Stair training;Functional mobility training;Therapeutic activities;Therapeutic exercise;Balance training;Neuromuscular re-education;Patient/family education;Orthotic Fit/Training;Dry needling;Manual techniques;Canalith Repostioning;Passive range of motion;Biofeedback    PT Next Visit Plan L UE/LE strengthening/ dynamic balance tasks.    PT Home Exercise Plan HEP: standing hip marches/abd, ankle DF/PF in sitting/supine, passive L elbow stretch for tone    Consulted and Agree with Plan of Care Patient    Family Member Consulted wife           Patient will benefit from skilled therapeutic intervention in order to improve the following deficits and impairments:  Abnormal gait,Decreased coordination,Decreased range of motion,Difficulty walking,Impaired tone,Decreased endurance,Impaired UE functional use,Decreased activity tolerance,Impaired perceived functional ability,Decreased balance,Impaired flexibility,Decreased cognition,Decreased mobility,Decreased strength,Increased edema,Postural dysfunction,Pain  Visit Diagnosis: Hemiplegia and hemiparesis  following cerebral infarction affecting left non-dominant side (HCC)  Difficulty in walking, not elsewhere classified  At risk for falls  Muscle weakness (generalized)  Pain in left arm  Unsteadiness on feet     Problem List Patient Active Problem List   Diagnosis Date Noted  . Type 2 diabetes mellitus with hyperglycemia (HCC) 10/29/2020  . Diastolic dysfunction   . DKA (diabetic ketoacidosis) (HCC) 09/20/2020  . Abnormal EKG 08/16/2020  . CKD (chronic kidney disease) stage 2, GFR 60-89 ml/min 06/27/2020  . Monitoring for anticoagulant use 06/27/2020  . Lower extremity edema 05/16/2020  . Paroxysmal atrial fibrillation (HCC) 10/26/2019  . Coagulopathy (HCC) 06/12/2019  . Gastroesophageal reflux disease   . Hemiplegia and hemiparesis following cerebral infarction affecting left non-dominant side (HCC) 11/25/2018  . RBBB 10/15/2018  . Dysphagia, post-stroke   . Microalbuminuria 07/29/2018  . Hyperlipidemia LDL goal <70 02/11/2017  . Recurrent strokes (HCC) 11/10/2016  . Essential hypertension 11/10/2016  . Tobacco abuse 08/28/2013  . Peripheral neuropathy 08/28/2013   Cammie Mcgee, PT, DPT # [phone] 10/30/2020, 7:20 PM  [facility] [address] [place], [place], [address] Phone: [phone]   Fax:  [phone]  Name: Evan BEAUBRUN Sr. MRN: [id number] Date of Birth: 10-13-1955

## 2020-10-29 NOTE — Assessment & Plan Note (Signed)
Doing well on levemir and meal time insulin without hypoglycemia. Will resend farxiga in hopes of more renal protection as well as eventual titration off of insulin. Referral generated for diabetic eye exam. F/u in 2 months for repeat a1c.

## 2020-10-29 NOTE — Assessment & Plan Note (Signed)
Doing well on current regimen, no changes made today. 

## 2020-10-31 ENCOUNTER — Other Ambulatory Visit: Payer: Self-pay

## 2020-10-31 ENCOUNTER — Ambulatory Visit: Payer: Medicaid Other | Admitting: Physical Therapy

## 2020-10-31 ENCOUNTER — Encounter: Payer: Self-pay | Admitting: Physical Therapy

## 2020-10-31 DIAGNOSIS — R2689 Other abnormalities of gait and mobility: Secondary | ICD-10-CM

## 2020-10-31 DIAGNOSIS — R2681 Unsteadiness on feet: Secondary | ICD-10-CM | POA: Diagnosis not present

## 2020-10-31 DIAGNOSIS — M6281 Muscle weakness (generalized): Secondary | ICD-10-CM | POA: Diagnosis not present

## 2020-10-31 DIAGNOSIS — R278 Other lack of coordination: Secondary | ICD-10-CM | POA: Diagnosis not present

## 2020-10-31 DIAGNOSIS — I69354 Hemiplegia and hemiparesis following cerebral infarction affecting left non-dominant side: Secondary | ICD-10-CM | POA: Diagnosis not present

## 2020-10-31 DIAGNOSIS — M79602 Pain in left arm: Secondary | ICD-10-CM

## 2020-10-31 DIAGNOSIS — Z9181 History of falling: Secondary | ICD-10-CM | POA: Diagnosis not present

## 2020-10-31 DIAGNOSIS — R262 Difficulty in walking, not elsewhere classified: Secondary | ICD-10-CM | POA: Diagnosis not present

## 2020-10-31 NOTE — Therapy (Signed)
[facility] [address] [place], [place], [address] Phone: [phone]   Fax:  [phone]  Physical Therapy Treatment  Patient Details  Name: Evan ARCIDIACONO Sr. MRN: [id number] Date of Birth: 07/03/1956 Referring Provider (PT): Welford Roche MD   Encounter Date: 10/31/2020   PT End of Session - 11/02/20 1143    Visit Number 8    Number of Visits 24    Date for PT Re-Evaluation 12/12/20    Authorization - Visit Number 8    Authorization - Number of Visits 10    PT Start Time 1418    PT Stop Time 1517    PT Time Calculation (min) 59 min    Equipment Utilized During Treatment Gait belt    Activity Tolerance Patient tolerated treatment well    Behavior During Therapy WFL for tasks assessed/performed           Past Medical History:  Diagnosis Date  . Carotid arterial disease (HCC)    a. 08/2018 Carotid U/S: min-mod RICA atherosclerosis w/o hemodynamically significant stenosis. Nl LICA.  . Diabetes 1.5, managed as type 2 (HCC)   . Diastolic dysfunction    a. 08/2018 Echo: EF 65%. No rwma. Gr1 DD. Mild MR.  . Diastolic dysfunction    a. 08/2019 Echo: EF 55-60%, Gr1 DD. No rwma. Mild MR. RVSP 37.55mmHg.  Marland Kitchen Hypercholesterolemia   . Hypertension   . PAF (paroxysmal atrial fibrillation) (HCC)    a. 10/2019 Event Monitor: PAF; b. CHA2DS2VASc = 5-->Eliquis.  Marland Kitchen Poorly controlled diabetes mellitus (HCC)    a. 04/2019 A1c 13.8.  Marland Kitchen Recurrent strokes (HCC)    a. 10/2016 MRI/A: Acute 36mm R thalamic infarct, ? subacute infarct of R corona radiata; b. 08/2017 MRI/A: Acute 8mm lateral L thalamic infarct. Other more remote lacunar infarcts of thalami bilat. Small vessel dzs; c. 08/2018 MRI/A: Acute lacunar infarct of the post limb of R internal capsule; d. 08/2019 MRI Acute CVA of L paramedian pons adn R cerebellar hemisphere.  . Tobacco abuse     Past Surgical History:  Procedure Laterality Date  . ESOPHAGOGASTRODUODENOSCOPY (EGD) WITH  PROPOFOL N/A 04/26/2019   Procedure: ESOPHAGOGASTRODUODENOSCOPY (EGD) WITH PROPOFOL;  Surgeon: Toney Reil, MD;  Location: [facility] ENDOSCOPY;  Service: Gastroenterology;  Laterality: N/A;    There were no vitals filed for this visit.      Pt. reports no new complaints. Pt. entered PT with use of QC      There.ex.:  Supine L elbow/shoulder/LE stretches (all planes)- static holds. Supine B UE wand ex. (chest press/ flexion/ abduction/ ER)- 20x each with PT assist.  Supine L hip/ hamstring stretches 3x each.   Supine marching/ SLR/ bridging 10x2 each.   Nustep L410 min. B UE/LE (focus on L elbow extension)- consistent cadence/ no rest breaks.Pt. Maintains L hand grasp during entire 10 min.  Seated marching/ LAQ/ knee flexion/ standing hip flexion/ abduction/ extension20x each (at //-bars).  Standing hip ex.: flexion/ abduction/ extension 20x each.     Neuro.mm.:  Walking in //-barsforward/ lateral walking4x each.  Standing wt. Shifting in //-bars with no UE assist.    Walking in PT clinic working on consistent L hip flexion/ step length. Working on [employer] preventing hyperextension (difficulty, esp. With step ups).   Posture correction in sitting/ standing (cuing to prevent posterior leaning).  Wt. Shifting in //-bars with no UE assist.         PT Long Term Goals - 09/23/20  1443      PT LONG TERM GOAL #1   Title Pt will increase Berg Balance to 48/56 to demonstrate clinically significant improvement in balance decreasing pt's risk for falls during functional activities.    Baseline 2/3: 42/56    Time 12    Period Weeks    Status New    Target Date 12/12/20      PT LONG TERM GOAL #2   Title Pt will improve 10 m gait speed to > 0.5 m/s  with LRAD to improve safe community ambulation.    Baseline 2/3: 0.32 m/s with SPC and SBA/CGA    Time 12    Period Weeks    Status New    Target Date 12/12/20      PT LONG TERM GOAL #3   Title Pt  will improve 5xSTS to < 12 sec with no RUE support to demonstrate clinically significant improvement in BLE strength to improve STS t/f's like standing from the toilet.    Baseline 2/3: 13.5 sec.    Time 12    Period Weeks    Status New    Target Date 12/12/20      PT LONG TERM GOAL #4   Title Pt will improve DGI by a minimum of 5 points to demonstrate clinically significant improvement and safety with community ambulation tasks to decrease risk of falls.    Baseline TBD    Time 12    Period Weeks    Status New    Target Date 12/12/20      PT LONG TERM GOAL #5   Title Pt will improve FOTO score to target amount to demonstrate perceived improvements in ability to complete ADL's.    Baseline 10/20: Perform next session.    Time 12    Period Weeks    Status New    Target Date 12/12/20             Plan - 11/02/20 1144    Clinical Impression Statement L shoulder/ elbow remain limited with significant distal bicep tightness/ contracture.  Pt. doing well with L hand grasp on Nustep and with step ups on handrail.  Extra time required with gait with few seated rest breaks taken.  Pt. works hard during tx. session and motivated to increase LE/ UE strength to improve functional mobility/ independence.    Personal Factors and Comorbidities Age;Comorbidity 3+;Education;Finances;Past/Current Experience;Fitness;Transportation    Comorbidities HTN, uncontrolled DM, HLD, R basal ganglia stroke on 08/2018 and L pointine and R cerebellar stroke 09/07/19, sinus bradycardia, peripheral neuropathy    Examination-Activity Limitations Bathing;Bed Mobility;Dressing;Transfers;Squat;Lift;Locomotion Level;Stairs;Reach Overhead;Carry;Stand;Sit    Examination-Participation Restrictions Community Activity;Interpersonal Relationship;Yard Work    Conservation officer, historic buildings Evolving/Moderate complexity    Clinical Decision Making Moderate    Rehab Potential Fair    PT Frequency 2x / week    PT Duration 12  weeks    PT Treatment/Interventions ADLs/Self Care Home Management;Aquatic Therapy;Electrical Stimulation;Moist Heat;DME Instruction;Gait training;Stair training;Functional mobility training;Therapeutic activities;Therapeutic exercise;Balance training;Neuromuscular re-education;Patient/family education;Orthotic Fit/Training;Dry needling;Manual techniques;Canalith Repostioning;Passive range of motion;Biofeedback    PT Next Visit Plan L UE/LE strengthening/ dynamic balance tasks.    PT Home Exercise Plan HEP: standing hip marches/abd, ankle DF/PF in sitting/supine, passive L elbow stretch for tone    Consulted and Agree with Plan of Care Patient    Family Member Consulted wife           Patient will benefit from skilled therapeutic intervention in order to improve the following deficits and impairments:  Abnormal gait,Decreased coordination,Decreased range of motion,Difficulty walking,Impaired tone,Decreased endurance,Impaired UE functional use,Decreased activity tolerance,Impaired perceived functional ability,Decreased balance,Impaired flexibility,Decreased cognition,Decreased mobility,Decreased strength,Increased edema,Postural dysfunction,Pain  Visit Diagnosis: Hemiplegia and hemiparesis following cerebral infarction affecting left non-dominant side (HCC)  Difficulty in walking, not elsewhere classified  At risk for falls  Muscle weakness (generalized)  Pain in left arm  Unsteadiness on feet  Other abnormalities of gait and mobility     Problem List Patient Active Problem List   Diagnosis Date Noted  . Type 2 diabetes mellitus with hyperglycemia (HCC) 10/29/2020  . Diastolic dysfunction   . DKA (diabetic ketoacidosis) (HCC) 09/20/2020  . Abnormal EKG 08/16/2020  . CKD (chronic kidney disease) stage 2, GFR 60-89 ml/min 06/27/2020  . Monitoring for anticoagulant use 06/27/2020  . Lower extremity edema 05/16/2020  . Paroxysmal atrial fibrillation (HCC) 10/26/2019  .  Coagulopathy (HCC) 06/12/2019  . Gastroesophageal reflux disease   . Hemiplegia and hemiparesis following cerebral infarction affecting left non-dominant side (HCC) 11/25/2018  . RBBB 10/15/2018  . Dysphagia, post-stroke   . Microalbuminuria 07/29/2018  . Hyperlipidemia LDL goal <70 02/11/2017  . Recurrent strokes (HCC) 11/10/2016  . Essential hypertension 11/10/2016  . Tobacco abuse 08/28/2013  . Peripheral neuropathy 08/28/2013   Cammie Mcgee, PT, DPT # [phone] 11/02/2020, 11:50 AM  [facility] [address] [place], [place], [address] Phone: [phone]   Fax:  [phone]  Name: CHIRON CAMPIONE Sr. MRN: [id number] Date of Birth: [birth year]

## 2020-11-05 ENCOUNTER — Ambulatory Visit: Payer: Medicaid Other | Admitting: Physical Therapy

## 2020-11-07 ENCOUNTER — Encounter: Payer: Self-pay | Admitting: Physical Therapy

## 2020-11-07 ENCOUNTER — Ambulatory Visit: Payer: Medicaid Other | Admitting: Physical Therapy

## 2020-11-07 ENCOUNTER — Other Ambulatory Visit: Payer: Self-pay

## 2020-11-07 DIAGNOSIS — R262 Difficulty in walking, not elsewhere classified: Secondary | ICD-10-CM | POA: Diagnosis not present

## 2020-11-07 DIAGNOSIS — M6281 Muscle weakness (generalized): Secondary | ICD-10-CM

## 2020-11-07 DIAGNOSIS — R2689 Other abnormalities of gait and mobility: Secondary | ICD-10-CM

## 2020-11-07 DIAGNOSIS — Z9181 History of falling: Secondary | ICD-10-CM | POA: Diagnosis not present

## 2020-11-07 DIAGNOSIS — R2681 Unsteadiness on feet: Secondary | ICD-10-CM | POA: Diagnosis not present

## 2020-11-07 DIAGNOSIS — R278 Other lack of coordination: Secondary | ICD-10-CM | POA: Diagnosis not present

## 2020-11-07 DIAGNOSIS — I69354 Hemiplegia and hemiparesis following cerebral infarction affecting left non-dominant side: Secondary | ICD-10-CM | POA: Diagnosis not present

## 2020-11-07 DIAGNOSIS — M79602 Pain in left arm: Secondary | ICD-10-CM | POA: Diagnosis not present

## 2020-11-07 NOTE — Therapy (Signed)
[facility] [address] [place], [place], [address] Phone: [phone]   Fax:  [phone]  Physical Therapy Treatment  Patient Details  Name: Evan MARXEN Sr. MRN: [id number] Date of Birth: [birth year] Referring Provider (PT): Welford Roche MD   Encounter Date: 11/07/2020   PT End of Session - 11/07/20 1437    Visit Number 9    Number of Visits 24    Date for PT Re-Evaluation 12/12/20    Authorization - Visit Number 9    Authorization - Number of Visits 10    PT Start Time 1425    PT Stop Time 1520    PT Time Calculation (min) 55 min    Equipment Utilized During Treatment Gait belt    Activity Tolerance Patient tolerated treatment well    Behavior During Therapy WFL for tasks assessed/performed           Past Medical History:  Diagnosis Date  . Carotid arterial disease (HCC)    a. 08/2018 Carotid U/S: min-mod RICA atherosclerosis w/o hemodynamically significant stenosis. Nl LICA.  . Diabetes 1.5, managed as type 2 (HCC)   . Diastolic dysfunction    a. 08/2018 Echo: EF 65%. No rwma. Gr1 DD. Mild MR.  . Diastolic dysfunction    a. 08/2019 Echo: EF 55-60%, Gr1 DD. No rwma. Mild MR. RVSP 37.71mmHg.  Marland Kitchen Hypercholesterolemia   . Hypertension   . PAF (paroxysmal atrial fibrillation) (HCC)    a. 10/2019 Event Monitor: PAF; b. CHA2DS2VASc = 5-->Eliquis.  Marland Kitchen Poorly controlled diabetes mellitus (HCC)    a. 04/2019 A1c 13.8.  Marland Kitchen Recurrent strokes (HCC)    a. 10/2016 MRI/A: Acute 55mm R thalamic infarct, ? subacute infarct of R corona radiata; b. 08/2017 MRI/A: Acute 45mm lateral L thalamic infarct. Other more remote lacunar infarcts of thalami bilat. Small vessel dzs; c. 08/2018 MRI/A: Acute lacunar infarct of the post limb of R internal capsule; d. 08/2019 MRI Acute CVA of L paramedian pons adn R cerebellar hemisphere.  . Tobacco abuse     Past Surgical History:  Procedure Laterality Date  . ESOPHAGOGASTRODUODENOSCOPY (EGD) WITH  PROPOFOL N/A 04/26/2019   Procedure: ESOPHAGOGASTRODUODENOSCOPY (EGD) WITH PROPOFOL;  Surgeon: Toney Reil, MD;  Location: [facility] ENDOSCOPY;  Service: Gastroenterology;  Laterality: N/A;    There were no vitals filed for this visit.   Subjective Assessment - 11/07/20 1436    Subjective Pt. reports a few stumbles but no falls since last PT visit.  Pt. had GI issues on Tuesday but reports feeling better.    Pertinent History Pertinent history and PMH: HTN, uncontrolled DM, HLD, R basal ganglia stroke on 08/2018 and L pointine and R cerebellar stroke 09/07/19, sinus bradycardia, peripheral neuropat    Patient Stated Goals Improve strengthening/ walking.  Return to fishing.    Currently in Pain? Yes    Pain Score 7     Pain Location Elbow    Pain Orientation Left    Pain Onset More than a month ago              There.ex.:  Nustep L410 min. B UE/LE (focus on L elbow extension)- Pt. Maintains L hand grasp during entire 10 min.  Supine L elbow/shoulder/LE stretches (all planes)- static holds. Supine L hip/ hamstring stretches 3x each.  Supine marching/ SLR/ bridging 10x2each.  Seated B UE wand ex. (chest press/ flexion/ abduction/ ER)- 20x each with PT assist.  Seated shoulder pulley (flexion/ abduction)- added to HEP  Neuro.mm.:  Standing wt. Shifting in //-bars with no UE assist.    Walking in PT clinic working on consistent L hip flexion/ step length. Working on L quad control.  Walking cone taps in //-bars with R UE assist (extra time required)  Posture correction in sitting/ standing (cuing to prevent posterior leaning).Wt. Shifting in //-bars with no UE assist.  Gait training:  Instruction in use of R Loftstand crutch with 2-point gait pattern.  Properly sized crutch and educated in use and sit to stands.  Amb. In clinic/ outside with Loftstand on grassy terrain and down curb.  Min. A for instruction and safety in grass.        PT Long Term Goals  - 09/23/20 0758      PT LONG TERM GOAL #1   Title Pt will increase Berg Balance to 48/56 to demonstrate clinically significant improvement in balance decreasing pt's risk for falls during functional activities.    Baseline 2/3: 42/56    Time 12    Period Weeks    Status New    Target Date 12/12/20      PT LONG TERM GOAL #2   Title Pt will improve 10 m gait speed to > 0.5 m/s  with LRAD to improve safe community ambulation.    Baseline 2/3: 0.32 m/s with SPC and SBA/CGA    Time 12    Period Weeks    Status New    Target Date 12/12/20      PT LONG TERM GOAL #3   Title Pt will improve 5xSTS to < 12 sec with no RUE support to demonstrate clinically significant improvement in BLE strength to improve STS t/f's like standing from the toilet.    Baseline 2/3: 13.5 sec.    Time 12    Period Weeks    Status New    Target Date 12/12/20      PT LONG TERM GOAL #4   Title Pt will improve DGI by a minimum of 5 points to demonstrate clinically significant improvement and safety with community ambulation tasks to decrease risk of falls.    Baseline TBD    Time 12    Period Weeks    Status New    Target Date 12/12/20      PT LONG TERM GOAL #5   Title Pt will improve FOTO score to target amount to demonstrate perceived improvements in ability to complete ADL's.    Baseline 10/20: Perform next session.    Time 12    Period Weeks    Status New    Target Date 12/12/20                 Plan - 11/07/20 1438    Clinical Impression Statement Walk demonstrates a slight improvement in gait pattern with use of Loftstrand crutch on R during 2-point gait pattern as compared to [facility].  Pt. reports feeling improved stability with Loftstrand and ambulates outside in grass/ uneven terrain on parking lot with SBA/mod. I safely.  Good endurance with completing of ther.ex./ outside walking around PT clinic.    Personal Factors and Comorbidities Age;Comorbidity 3+;Education;Finances;Past/Current  Experience;Fitness;Transportation    Comorbidities HTN, uncontrolled DM, HLD, R basal ganglia stroke on 08/2018 and L pointine and R cerebellar stroke 09/07/19, sinus bradycardia, peripheral neuropathy    Examination-Activity Limitations Bathing;Bed Mobility;Dressing;Transfers;Squat;Lift;Locomotion Level;Stairs;Reach Overhead;Carry;Stand;Sit    Examination-Participation Restrictions Community Activity;Interpersonal Relationship;Yard Work    Stability/Clinical Decision Making Evolving/Moderate complexity    Clinical Decision Making Moderate  Rehab Potential Fair    PT Frequency 2x / week    PT Duration 12 weeks    PT Treatment/Interventions ADLs/Self Care Home Management;Aquatic Therapy;Electrical Stimulation;Moist Heat;DME Instruction;Gait training;Stair training;Functional mobility training;Therapeutic activities;Therapeutic exercise;Balance training;Neuromuscular re-education;Patient/family education;Orthotic Fit/Training;Dry needling;Manual techniques;Canalith Repostioning;Passive range of motion;Biofeedback    PT Next Visit Plan L UE/LE strengthening/ dynamic balance tasks.  REASSESS BERG next tx.    PT Home Exercise Plan HEP: standing hip marches/abd, ankle DF/PF in sitting/supine, passive L elbow stretch for tone    Consulted and Agree with Plan of Care Patient    Family Member Consulted wife           Patient will benefit from skilled therapeutic intervention in order to improve the following deficits and impairments:  Abnormal gait,Decreased coordination,Decreased range of motion,Difficulty walking,Impaired tone,Decreased endurance,Impaired UE functional use,Decreased activity tolerance,Impaired perceived functional ability,Decreased balance,Impaired flexibility,Decreased cognition,Decreased mobility,Decreased strength,Increased edema,Postural dysfunction,Pain  Visit Diagnosis: Hemiplegia and hemiparesis following cerebral infarction affecting left non-dominant side (HCC)  Difficulty  in walking, not elsewhere classified  At risk for falls  Muscle weakness (generalized)  Pain in left arm  Unsteadiness on feet  Other abnormalities of gait and mobility  Other lack of coordination     Problem List Patient Active Problem List   Diagnosis Date Noted  . Type 2 diabetes mellitus with hyperglycemia (HCC) 10/29/2020  . Diastolic dysfunction   . DKA (diabetic ketoacidosis) (HCC) 09/20/2020  . Abnormal EKG 08/16/2020  . CKD (chronic kidney disease) stage 2, GFR 60-89 ml/min 06/27/2020  . Monitoring for anticoagulant use 06/27/2020  . Lower extremity edema 05/16/2020  . Paroxysmal atrial fibrillation (HCC) 10/26/2019  . Coagulopathy (HCC) 06/12/2019  . Gastroesophageal reflux disease   . Hemiplegia and hemiparesis following cerebral infarction affecting left non-dominant side (HCC) 11/25/2018  . RBBB 10/15/2018  . Dysphagia, post-stroke   . Microalbuminuria 07/29/2018  . Hyperlipidemia LDL goal <70 02/11/2017  . Recurrent strokes (HCC) 11/10/2016  . Essential hypertension 11/10/2016  . Tobacco abuse 08/28/2013  . Peripheral neuropathy 08/28/2013   Cammie Mcgee, PT, DPT # [phone] 11/08/2020, 9:59 AM  [place] [address] [place], [place], [address] Phone: [phone]   Fax:  [phone]  Name: MATTHEO SWINDLE Sr. MRN: [id number] Date of Birth: [birth year]

## 2020-11-12 ENCOUNTER — Other Ambulatory Visit: Payer: Self-pay

## 2020-11-12 ENCOUNTER — Ambulatory Visit: Payer: Medicaid Other | Admitting: Physical Therapy

## 2020-11-12 DIAGNOSIS — R278 Other lack of coordination: Secondary | ICD-10-CM | POA: Diagnosis not present

## 2020-11-12 DIAGNOSIS — M6281 Muscle weakness (generalized): Secondary | ICD-10-CM

## 2020-11-12 DIAGNOSIS — R2689 Other abnormalities of gait and mobility: Secondary | ICD-10-CM

## 2020-11-12 DIAGNOSIS — I69354 Hemiplegia and hemiparesis following cerebral infarction affecting left non-dominant side: Secondary | ICD-10-CM

## 2020-11-12 DIAGNOSIS — Z9181 History of falling: Secondary | ICD-10-CM

## 2020-11-12 DIAGNOSIS — R262 Difficulty in walking, not elsewhere classified: Secondary | ICD-10-CM | POA: Diagnosis not present

## 2020-11-12 DIAGNOSIS — R2681 Unsteadiness on feet: Secondary | ICD-10-CM

## 2020-11-12 DIAGNOSIS — M79602 Pain in left arm: Secondary | ICD-10-CM

## 2020-11-14 ENCOUNTER — Other Ambulatory Visit: Payer: Self-pay

## 2020-11-14 ENCOUNTER — Ambulatory Visit: Payer: Medicaid Other | Admitting: Physical Therapy

## 2020-11-14 DIAGNOSIS — R278 Other lack of coordination: Secondary | ICD-10-CM | POA: Diagnosis not present

## 2020-11-14 DIAGNOSIS — R262 Difficulty in walking, not elsewhere classified: Secondary | ICD-10-CM

## 2020-11-14 DIAGNOSIS — R2681 Unsteadiness on feet: Secondary | ICD-10-CM

## 2020-11-14 DIAGNOSIS — R2689 Other abnormalities of gait and mobility: Secondary | ICD-10-CM | POA: Diagnosis not present

## 2020-11-14 DIAGNOSIS — Z9181 History of falling: Secondary | ICD-10-CM

## 2020-11-14 DIAGNOSIS — M79602 Pain in left arm: Secondary | ICD-10-CM

## 2020-11-14 DIAGNOSIS — I69354 Hemiplegia and hemiparesis following cerebral infarction affecting left non-dominant side: Secondary | ICD-10-CM

## 2020-11-14 DIAGNOSIS — M6281 Muscle weakness (generalized): Secondary | ICD-10-CM

## 2020-11-15 ENCOUNTER — Encounter: Payer: Self-pay | Admitting: Physical Therapy

## 2020-11-15 NOTE — Therapy (Signed)
[facility] [address] [place], [place], [address] Phone: [phone]   Fax:  [phone]  Physical Therapy Treatment  Patient Details  Name: Evan SAUCEDA Sr. MRN: [id number] Date of Birth: 09-Aug-1956 Referring Provider (PT): Lebron Conners MD   Encounter Date: 11/14/2020   PT End of Session - 11/15/20 1218    Visit Number 11    Number of Visits 24    Date for PT Re-Evaluation 12/12/20    Authorization - Visit Number 1    Authorization - Number of Visits 10    PT Start Time 3382    PT Stop Time 1516    PT Time Calculation (min) 63 min    Equipment Utilized During Treatment Gait belt    Activity Tolerance Patient tolerated treatment well    Behavior During Therapy WFL for tasks assessed/performed           Past Medical History:  Diagnosis Date  . Carotid arterial disease ([place])    a. 08/2018 Carotid U/S: min-mod RICA atherosclerosis w/o hemodynamically significant stenosis. Nl LICA.  . Diabetes 1.5, managed as type 2 ([place])   . Diastolic dysfunction    a. 08/2018 Echo: EF 65%. No rwma. Gr1 DD. Mild MR.  . Diastolic dysfunction    a. 08/2019 Echo: EF 55-60%, Gr1 DD. No rwma. Mild MR. RVSP 37.46mHg.  .Marland KitchenHypercholesterolemia   . Hypertension   . PAF (paroxysmal atrial fibrillation) ([place]    a. 10/2019 Event Monitor: PAF; b. CHA2DS2VASc = 5-->Eliquis.  .Marland KitchenPoorly controlled diabetes mellitus ([place]    a. 04/2019 A1c 13.8.  .Marland KitchenRecurrent strokes ([place]    a. 10/2016 MRI/A: Acute 536mR thalamic infarct, ? subacute infarct of R corona radiata; b. 08/2017 MRI/A: Acute 36m2materal L thalamic infarct. Other more remote lacunar infarcts of thalami bilat. Small vessel dzs; c. 08/2018 MRI/A: Acute lacunar infarct of the post limb of R internal capsule; d. 08/2019 MRI Acute CVA of L paramedian pons adn R cerebellar hemisphere.  . Tobacco abuse     Past Surgical History:  Procedure Laterality Date  . ESOPHAGOGASTRODUODENOSCOPY (EGD) WITH  PROPOFOL N/A 04/26/2019   Procedure: ESOPHAGOGASTRODUODENOSCOPY (EGD) WITH PROPOFOL;  Surgeon: VanLin LandsmanD;  [facility];  Laterality: N/A;    There were no vitals filed for this visit.   Subjective Assessment - 11/15/20 1216    Subjective Pt. reports no L shoulder/ elbow pain while sitting in waiting room prior to PT.  Pt. wants to walk back to rehab gym with use of Loftstrand crutch, instead of SPC.    Pertinent History Pertinent history and PMH: HTN, uncontrolled DM, HLD, R basal ganglia stroke on 08/2018 and L pointine and R cerebellar stroke 09/07/19, sinus bradycardia, peripheral neuropat    Patient Stated Goals Improve strengthening/ walking.  Return to fishing.    Currently in Pain? No/denies    Pain Onset More than a month ago              There.ex.:  Nustep L410 min. B UE/LE (focus on L elbow extension)- Pt. Maintains L hand grasp during entire 10 min.  Supine L elbow/shoulder/LE stretches (all planes)- static holds. Supine L hip/ hamstring stretches 3x each.  Seated Nautilus: 30# lat. Pull downs 20x (good L hand grasp).    Standing hip flexion/ extension 20x each.  (//-bars).     Neuro.mm.:  Hurdles in //-bar with focus on step pattern (midline) and maintain upright  posture.    Standing posture correction in //-bars with mirror feedback.   Walking in PT clinic working on consistent L hip flexion/ step length. Working on L quad control.  Step touches at stairs with UE assist on handrail (cuing to correct posture).   STS from gray chair with use of single Loftstand crutch.     Gait training:  Amb. In clinic/ hallway with Loftstand on working on turning to L/R and varying cadence.  No LOB and CGA for safety/ verbal cuing.     PT Long Term Goals - 11/15/20 1203      PT LONG TERM GOAL #1   Title Pt will increase Berg Balance to 48/56 to demonstrate clinically significant improvement in balance  decreasing pt's risk for falls during functional activities.    Baseline 2/3: 42/56    Time 12    Period Weeks    Status On-going    Target Date 12/12/20      PT LONG TERM GOAL #2   Title Pt will improve 10 m gait speed to > 0.5 m/s  with LRAD to improve safe community ambulation.    Baseline 2/3: 0.32 m/s with SPC and SBA/CGA    Time 12    Period Weeks    Status Not Met    Target Date 12/12/20      PT LONG TERM GOAL #3   Title Pt will improve 5xSTS to < 12 sec with no RUE support to demonstrate clinically significant improvement in BLE strength to improve STS t/f's like standing from the toilet.    Baseline 2/3: 13.5 sec.    Time 12    Period Weeks    Status On-going    Target Date 12/12/20      PT LONG TERM GOAL #4   Title Pt will improve DGI by a minimum of 5 points to demonstrate clinically significant improvement and safety with community ambulation tasks to decrease risk of falls.    Baseline TBD    Time 12    Period Weeks    Status Not Met    Target Date 12/12/20                 Plan - 11/15/20 1219    Clinical Impression Statement PT has not received signed MD order for Loftstrand crutch at this time.  Pt. loaned PT clinic Loftstrand for weekend/ home use to see how he likes it instead of [facility] with daily tasks.  Good step pattern with hurdles in //-bars and able to maintain L hand grasp with Nautilus pull downs at 30# resistance.  No LOB during tx. session and pt. demonstrating mod. with ues of Loftstand while walking to bathroom/ outside to transport vehicle.    Personal Factors and Comorbidities Age;Comorbidity 3+;Education;Finances;Past/Current Experience;Fitness;Transportation    Comorbidities HTN, uncontrolled DM, HLD, R basal ganglia stroke on 08/2018 and L pointine and R cerebellar stroke 09/07/19, sinus bradycardia, peripheral neuropathy    Examination-Activity Limitations Bathing;Bed Mobility;Dressing;Transfers;Squat;Lift;Locomotion Level;Stairs;Reach  Overhead;Carry;Stand;Sit    Examination-Participation Restrictions Community Activity;Interpersonal Relationship;Yard Work    Merchant [employer] officer Evolving/Moderate complexity    Clinical Decision Making Moderate    Rehab Potential Fair    PT Frequency 2x / week    PT Duration 12 weeks    PT Treatment/Interventions ADLs/Self Care Home Management;Aquatic Therapy;Electrical Stimulation;Moist Heat;DME Instruction;Gait training;Stair training;Functional mobility training;Therapeutic activities;Therapeutic exercise;Balance training;Neuromuscular re-education;Patient/family education;Orthotic Fit/Training;Dry needling;Manual techniques;Canalith Repostioning;Passive range of motion;Biofeedback    PT Next Visit Plan L UE/LE strengthening/ dynamic balance  tasks.  Discuss Loftstand crutch over the weekend.    PT Home Exercise Plan HEP: standing hip marches/abd, ankle DF/PF in sitting/supine, passive L elbow stretch for tone    Consulted and Agree with Plan of Care Patient    Family Member Consulted wife           Patient will benefit from skilled therapeutic intervention in order to improve the following deficits and impairments:  Abnormal gait,Decreased coordination,Decreased range of motion,Difficulty walking,Impaired tone,Decreased endurance,Impaired UE functional use,Decreased activity tolerance,Impaired perceived functional ability,Decreased balance,Impaired flexibility,Decreased cognition,Decreased mobility,Decreased strength,Increased edema,Postural dysfunction,Pain  Visit Diagnosis: Hemiplegia and hemiparesis following cerebral infarction affecting left non-dominant side (HCC)  Difficulty in walking, not elsewhere classified  At risk for falls  Muscle weakness (generalized)  Pain in left arm  Unsteadiness on feet  Other abnormalities of gait and mobility  Other lack of coordination     Problem List Patient Active Problem List   Diagnosis Date Noted  . Type 2  diabetes mellitus with hyperglycemia ([place]) 10/29/2020  . Diastolic dysfunction   . DKA (diabetic ketoacidosis) ([place]) 09/20/2020  . Abnormal EKG 08/16/2020  . CKD (chronic kidney disease) stage 2, GFR 60-89 ml/min 06/27/2020  . Monitoring for anticoagulant use 06/27/2020  . Lower extremity edema 05/16/2020  . Paroxysmal atrial fibrillation ([place]) 10/26/2019  . Coagulopathy ([place]) 06/12/2019  . Gastroesophageal reflux disease   . Hemiplegia and hemiparesis following cerebral infarction affecting left non-dominant side ([place]) 11/25/2018  . RBBB 10/15/2018  . Dysphagia, post-stroke   . Microalbuminuria 07/29/2018  . Hyperlipidemia LDL goal <70 02/11/2017  . Recurrent strokes ([place]) 11/10/2016  . Essential hypertension 11/10/2016  . Tobacco abuse 08/28/2013  . Peripheral neuropathy 08/28/2013   Pura Spice, PT, DPT # [phone] 11/15/2020, 12:24 PM   [facility] [address] [place], [place], [address] Phone: [phone]   Fax:  [phone]  Name: Evan COLGLAZIER Sr. MRN: [id number] Date of Birth: 05-30-1956

## 2020-11-15 NOTE — Therapy (Signed)
[facility] [address] [place], [place], [address] Phone: [phone]   Fax:  [phone]  Physical Therapy Treatment Physical Therapy Progress Note   Dates of reporting period  09/19/20  to  11/12/20  Patient Details  Name: Evan LEONHARDT Sr. MRN: [id number] Date of Birth: 1956/01/13 Referring Provider (PT): Lebron Conners MD   Encounter Date: 11/12/2020   PT End of Session - 11/15/20 1159    Visit Number 10    Number of Visits 24    Date for PT Re-Evaluation 12/12/20    Authorization - Visit Number 10    Authorization - Number of Visits 10    PT Start Time 2122    PT Stop Time 1518    PT Time Calculation (min) 64 min    Equipment Utilized During Treatment Gait belt    Activity Tolerance Patient tolerated treatment well    Behavior During Therapy WFL for tasks assessed/performed           Past Medical History:  Diagnosis Date  . Carotid arterial disease ([place])    a. 08/2018 Carotid U/S: min-mod RICA atherosclerosis w/o hemodynamically significant stenosis. Nl LICA.  . Diabetes 1.5, managed as type 2 ([place])   . Diastolic dysfunction    a. 08/2018 Echo: EF 65%. No rwma. Gr1 DD. Mild MR.  . Diastolic dysfunction    a. 08/2019 Echo: EF 55-60%, Gr1 DD. No rwma. Mild MR. RVSP 37.44mHg.  .Marland KitchenHypercholesterolemia   . Hypertension   . PAF (paroxysmal atrial fibrillation) ([place]    a. 10/2019 Event Monitor: PAF; b. CHA2DS2VASc = 5-->Eliquis.  .Marland KitchenPoorly controlled diabetes mellitus ([place]    a. 04/2019 A1c 13.8.  .Marland KitchenRecurrent strokes ([place]    a. 10/2016 MRI/A: Acute 536mR thalamic infarct, ? subacute infarct of R corona radiata; b. 08/2017 MRI/A: Acute 31m36materal L thalamic infarct. Other more remote lacunar infarcts of thalami bilat. Small vessel dzs; c. 08/2018 MRI/A: Acute lacunar infarct of the post limb of R internal capsule; d. 08/2019 MRI Acute CVA of L paramedian pons adn R cerebellar hemisphere.  . Tobacco abuse     Past  Surgical History:  Procedure Laterality Date  . ESOPHAGOGASTRODUODENOSCOPY (EGD) WITH PROPOFOL N/A 04/26/2019   Procedure: ESOPHAGOGASTRODUODENOSCOPY (EGD) WITH PROPOFOL;  Surgeon: VanLin LandsmanD;  [place] [facility];  Laterality: N/A;    There were no vitals filed for this visit.   Subjective Assessment - 11/15/20 1157    Subjective Pt. states he really liked using the Loftstrand crutch as [facility].    Pertinent History Pertinent history and PMH: HTN, uncontrolled DM, HLD, R basal ganglia stroke on 08/2018 and L pointine and R cerebellar stroke 09/07/19, sinus bradycardia, peripheral neuropat    Patient Stated Goals Improve strengthening/ walking.  Return to fishing.    Currently in Pain? No/denies    Pain Onset More than a month ago               There.ex.:  Nustep L410 min. B UE/LE (focus on L elbow extension)- Pt. Maintains L hand grasp during entire 10 min. Discussed weekend activities/ use of Loftstrand  Supine L elbow/shoulder/LE stretches (all planes)- static holds. Supine L hip/ hamstring stretches 3x each.  Seated B UE wand ex. (chest press/ flexion/ abduction/ ER)- 20x each with PT assist.   Neuro.mm.:  Step touches at stairs (6")/ step ups L/R 10x each (CGA for safety/ cuing  to prevent knee hyperextension)  Standing wt. Shifting in //-bars with no UE assist.  Walking in PT clinic working on consistent L hip flexion/ step length. Working on L quad control.  Walking cone taps in //-bars with R UE assist (extra time required)  Posture correction in sitting/ standing (cuing to prevent posterior leaning).Wt. Shifting in //-bars with no UE assist.  Gait training:  Ambulate with R Loftstand crutch with 2-point gait pattern.  Amb. In clinic/ outside with Loftstand on grassy terrain and down curb.  Min. A for instruction and safety in grass.  Pt. Really likes the support of the  Loftstrand as compared to [facility].  Slight increase in cadence/ posture with Loftstand.   No LOB during tx. Session.      PT Long Term Goals - 11/15/20 1203      PT LONG TERM GOAL #1   Title Pt will increase Berg Balance to 48/56 to demonstrate clinically significant improvement in balance decreasing pt's risk for falls during functional activities.    Baseline 2/3: 42/56    Time 12    Period Weeks    Status On-going    Target Date 12/12/20      PT LONG TERM GOAL #2   Title Pt will improve 10 m gait speed to > 0.5 m/s  with LRAD to improve safe community ambulation.    Baseline 2/3: 0.32 m/s with SPC and SBA/CGA    Time 12    Period Weeks    Status Not Met    Target Date 12/12/20      PT LONG TERM GOAL #3   Title Pt will improve 5xSTS to < 12 sec with no RUE support to demonstrate clinically significant improvement in BLE strength to improve STS t/f's like standing from the toilet.    Baseline 2/3: 13.5 sec.    Time 12    Period Weeks    Status On-going    Target Date 12/12/20      PT LONG TERM GOAL #4   Title Pt will improve DGI by a minimum of 5 points to demonstrate clinically significant improvement and safety with community ambulation tasks to decrease risk of falls.    Baseline TBD    Time 12    Period Weeks    Status Not Met    Target Date 12/12/20                 Plan - 11/15/20 1200    Clinical Impression Statement Pt. ambulates with a slight improvement in gait pattern while using Loftstrand.  PT send MD order for Loftstrand to allow pt. to use at home/ community.  Pt. able to consistent step over hurdles with L LE with no mistakes and use of R Loftstrand for 2-point gait/ safety.  Pt. pain limited with L shoulder/ elbow stretches but focused on increasing ROM to improve mobility.    Personal Factors and Comorbidities Age;Comorbidity 3+;Education;Finances;Past/Current Experience;Fitness;Transportation    Comorbidities HTN, uncontrolled DM, HLD, R basal ganglia  stroke on 08/2018 and L pointine and R cerebellar stroke 09/07/19, sinus bradycardia, peripheral neuropathy    Examination-Activity Limitations Bathing;Bed Mobility;Dressing;Transfers;Squat;Lift;Locomotion Level;Stairs;Reach Overhead;Carry;Stand;Sit    Examination-Participation Restrictions Community Activity;Interpersonal Relationship;Yard Work    Merchant [employer] officer Evolving/Moderate complexity    Clinical Decision Making Moderate    Rehab Potential Fair    PT Frequency 2x / week    PT Duration 12 weeks    PT Treatment/Interventions ADLs/Self Care Home Management;Aquatic Therapy;Electrical Stimulation;Moist Heat;DME Instruction;Gait training;Stair  training;Functional mobility training;Therapeutic activities;Therapeutic exercise;Balance training;Neuromuscular re-education;Patient/family education;Orthotic Fit/Training;Dry needling;Manual techniques;Canalith Repostioning;Passive range of motion;Biofeedback    PT Next Visit Plan L UE/LE strengthening/ dynamic balance tasks.    PT Home Exercise Plan HEP: standing hip marches/abd, ankle DF/PF in sitting/supine, passive L elbow stretch for tone    Consulted and Agree with Plan of Care Patient    Family Member Consulted wife           Patient will benefit from skilled therapeutic intervention in order to improve the following deficits and impairments:  Abnormal gait,Decreased coordination,Decreased range of motion,Difficulty walking,Impaired tone,Decreased endurance,Impaired UE functional use,Decreased activity tolerance,Impaired perceived functional ability,Decreased balance,Impaired flexibility,Decreased cognition,Decreased mobility,Decreased strength,Increased edema,Postural dysfunction,Pain  Visit Diagnosis: Hemiplegia and hemiparesis following cerebral infarction affecting left non-dominant side (HCC)  Difficulty in walking, not elsewhere classified  At risk for falls  Muscle weakness (generalized)  Pain in left  arm  Unsteadiness on feet  Other abnormalities of gait and mobility  Other lack of coordination     Problem List Patient Active Problem List   Diagnosis Date Noted  . Type 2 diabetes mellitus with hyperglycemia ([place]) 10/29/2020  . Diastolic dysfunction   . DKA (diabetic ketoacidosis) ([place]) 09/20/2020  . Abnormal EKG 08/16/2020  . CKD (chronic kidney disease) stage 2, GFR 60-89 ml/min 06/27/2020  . Monitoring for anticoagulant use 06/27/2020  . Lower extremity edema 05/16/2020  . Paroxysmal atrial fibrillation ([place]) 10/26/2019  . Coagulopathy ([place]) 06/12/2019  . Gastroesophageal reflux disease   . Hemiplegia and hemiparesis following cerebral infarction affecting left non-dominant side ([place]) 11/25/2018  . RBBB 10/15/2018  . Dysphagia, post-stroke   . Microalbuminuria 07/29/2018  . Hyperlipidemia LDL goal <70 02/11/2017  . Recurrent strokes ([place]) 11/10/2016  . Essential hypertension 11/10/2016  . Tobacco abuse 08/28/2013  . Peripheral neuropathy 08/28/2013   Pura Spice, PT, DPT # [phone] 11/15/2020, 12:06 PM  [facility] [address] [place], [place], [address] Phone: [phone]   Fax:  [phone]  Name: Evan NUZUM Sr. MRN: [id number] Date of Birth: [birth year]

## 2020-11-19 ENCOUNTER — Other Ambulatory Visit: Payer: Self-pay

## 2020-11-19 ENCOUNTER — Ambulatory Visit: Payer: Medicaid Other | Attending: Internal Medicine | Admitting: Physical Therapy

## 2020-11-19 ENCOUNTER — Other Ambulatory Visit: Payer: Self-pay | Admitting: Family Medicine

## 2020-11-19 DIAGNOSIS — E1143 Type 2 diabetes mellitus with diabetic autonomic (poly)neuropathy: Secondary | ICD-10-CM

## 2020-11-19 DIAGNOSIS — M6281 Muscle weakness (generalized): Secondary | ICD-10-CM | POA: Insufficient documentation

## 2020-11-19 DIAGNOSIS — M79602 Pain in left arm: Secondary | ICD-10-CM | POA: Diagnosis not present

## 2020-11-19 DIAGNOSIS — R278 Other lack of coordination: Secondary | ICD-10-CM | POA: Diagnosis not present

## 2020-11-19 DIAGNOSIS — Z9181 History of falling: Secondary | ICD-10-CM | POA: Insufficient documentation

## 2020-11-19 DIAGNOSIS — R2681 Unsteadiness on feet: Secondary | ICD-10-CM | POA: Diagnosis not present

## 2020-11-19 DIAGNOSIS — R2689 Other abnormalities of gait and mobility: Secondary | ICD-10-CM | POA: Diagnosis not present

## 2020-11-19 DIAGNOSIS — R262 Difficulty in walking, not elsewhere classified: Secondary | ICD-10-CM | POA: Insufficient documentation

## 2020-11-19 DIAGNOSIS — I69354 Hemiplegia and hemiparesis following cerebral infarction affecting left non-dominant side: Secondary | ICD-10-CM | POA: Diagnosis not present

## 2020-11-19 NOTE — Therapy (Signed)
[facility] [address] [place], [place], [address] Phone: [phone]   Fax:  [phone]  Physical Therapy Treatment  Patient Details  Name: Evan PELAEZ Sr. MRN: [id number] Date of Birth: 11-05-1955 Referring Provider (PT): Lebron Conners MD   Encounter Date: 11/19/2020   PT End of Session - 11/22/20 0801    Visit Number 12    Number of Visits 24    Date for PT Re-Evaluation 12/12/20    Authorization - Visit Number 2    Authorization - Number of Visits 10    PT Start Time 1410    PT Stop Time 1515    PT Time Calculation (min) 65 min    Equipment Utilized During Treatment Gait belt    Activity Tolerance Patient tolerated treatment well    Behavior During Therapy WFL for tasks assessed/performed           Past Medical History:  Diagnosis Date  . Carotid arterial disease ([place])    a. 08/2018 Carotid U/S: min-mod RICA atherosclerosis w/o hemodynamically significant stenosis. Nl LICA.  . Diabetes 1.5, managed as type 2 ([place])   . Diastolic dysfunction    a. 08/2018 Echo: EF 65%. No rwma. Gr1 DD. Mild MR.  . Diastolic dysfunction    a. 08/2019 Echo: EF 55-60%, Gr1 DD. No rwma. Mild MR. RVSP 37.82mHg.  .Marland KitchenHypercholesterolemia   . Hypertension   . PAF (paroxysmal atrial fibrillation) ([place]    a. 10/2019 Event Monitor: PAF; b. CHA2DS2VASc = 5-->Eliquis.  .Marland KitchenPoorly controlled diabetes mellitus ([place]    a. 04/2019 A1c 13.8.  .Marland KitchenRecurrent strokes ([place]    a. 10/2016 MRI/A: Acute 515mR thalamic infarct, ? subacute infarct of R corona radiata; b. 08/2017 MRI/A: Acute 51m651materal L thalamic infarct. Other more remote lacunar infarcts of thalami bilat. Small vessel dzs; c. 08/2018 MRI/A: Acute lacunar infarct of the post limb of R internal capsule; d. 08/2019 MRI Acute CVA of L paramedian pons adn R cerebellar hemisphere.  . Tobacco abuse     Past Surgical History:  Procedure Laterality Date  . ESOPHAGOGASTRODUODENOSCOPY (EGD) WITH  PROPOFOL N/A 04/26/2019   Procedure: ESOPHAGOGASTRODUODENOSCOPY (EGD) WITH PROPOFOL;  Surgeon: VanLin LandsmanD;  [facility];  Laterality: N/A;    There were no vitals filed for this visit.   Subjective Assessment - 11/22/20 0759    Subjective Pt. borrowed PT clinic single Loftstrand for the weekend and prefers crutch vs. SPC.  Pt. reports no L shoulder/ elbow pain prior to tx.    Pertinent History Pertinent history and PMH: HTN, uncontrolled DM, HLD, R basal ganglia stroke on 08/2018 and L pointine and R cerebellar stroke 09/07/19, sinus bradycardia, peripheral neuropat    Patient Stated Goals Improve strengthening/ walking.  Return to fishing.    Currently in Pain? No/denies    Pain Onset More than a month ago             There.ex.:  Nustep L410 min. B UE/LE (focus on L elbow extension)- Pt. Maintains L hand grasp during entire 10 min.  Seated L elbow/shoulder/LE stretches (all planes)- static holds. Supine L hip/ hamstring stretches 3x each.  Seated wand chest press/ shoulder flexion AAROM (PT assist as needed).    Standing 4# wrist wt. To increase L elbow extension during walking ex. In //-bars to improve L shoulder/elbow position.   Standing hip flexion/ extension 20x each.  (//-bars).  Neuro.mm.:  Maneuvering cones in gym with use of Loftstrand  Walking in //-bars with no assistive device 12' x 4 (CGA for safety/ cuing).    Walking in PT clinic working on consistent L hip flexion/ step length. Working on L quad control. Step touches at stairs with UE assist on handrail (cuing to correct posture).    Gait training:  Amb. In clinic/ hallway with Loftstand on working on turning to L/R and varying cadence.  Pt. Ended tx. Session with walking from gym to car with assistive device and CGA for safety.  No LOB.  Pt. Demonstrates ability to ambulate short distances with no assistive device but CGA required.       Pt. Will receive Loftstand next tx. Session.      PT Long Term Goals - 11/15/20 1203      PT LONG TERM GOAL #1   Title Pt will increase Berg Balance to 48/56 to demonstrate clinically significant improvement in balance decreasing pt's risk for falls during functional activities.    Baseline 2/3: 42/56    Time 12    Period Weeks    Status On-going    Target Date 12/12/20      PT LONG TERM GOAL #2   Title Pt will improve 10 m gait speed to > 0.5 m/s  with LRAD to improve safe community ambulation.    Baseline 2/3: 0.32 m/s with SPC and SBA/CGA    Time 12    Period Weeks    Status Not Met    Target Date 12/12/20      PT LONG TERM GOAL #3   Title Pt will improve 5xSTS to < 12 sec with no RUE support to demonstrate clinically significant improvement in BLE strength to improve STS t/f's like standing from the toilet.    Baseline 2/3: 13.5 sec.    Time 12    Period Weeks    Status On-going    Target Date 12/12/20      PT LONG TERM GOAL #4   Title Pt will improve DGI by a minimum of 5 points to demonstrate clinically significant improvement and safety with community ambulation tasks to decrease risk of falls.    Baseline TBD    Time 12    Period Weeks    Status Not Met    Target Date 12/12/20                 Plan - 11/22/20 0802    Clinical Impression Statement No LOB during tx. and pt. progressing to using single Loftstrand instead of SPC.  PT used 4# wrist wt. on L during standing ex. to promote improved elbow positioning during gait.  Pt. works hard during tx. session to increase step length/ cadence with 2-point gait.  Pt. will continue with skilled PT 1x/ week with focus on HEP and consistent walking.  PT will give pt. a new Loftstrand that was donated to clinic next tx. session.    Personal Factors and Comorbidities Age;Comorbidity 3+;Education;Finances;Past/Current Experience;Fitness;Transportation    Comorbidities HTN, uncontrolled DM, HLD, R basal ganglia stroke  on 08/2018 and L pointine and R cerebellar stroke 09/07/19, sinus bradycardia, peripheral neuropathy    Examination-Activity Limitations Bathing;Bed Mobility;Dressing;Transfers;Squat;Lift;Locomotion Level;Stairs;Reach Overhead;Carry;Stand;Sit    Examination-Participation Restrictions Community Activity;Interpersonal Relationship;Yard Work    Merchant navy officer Evolving/Moderate complexity    Clinical Decision Making Moderate    Rehab Potential Fair    PT Frequency 2x / week    PT Duration 12 weeks  PT Treatment/Interventions ADLs/Self Care Home Management;Aquatic Therapy;Electrical Stimulation;Moist Heat;DME Instruction;Gait training;Stair training;Functional mobility training;Therapeutic activities;Therapeutic exercise;Balance training;Neuromuscular re-education;Patient/family education;Orthotic Fit/Training;Dry needling;Manual techniques;Canalith Repostioning;Passive range of motion;Biofeedback    PT Next Visit Plan L UE/LE strengthening/ dynamic balance tasks.  GIVE Pt. LOFTSTRAND CRUTCH (located on shelving unit)    PT Home Exercise Plan HEP: standing hip marches/abd, ankle DF/PF in sitting/supine, passive L elbow stretch for tone    Consulted and Agree with Plan of Care Patient    Family Member Consulted wife           Patient will benefit from skilled therapeutic intervention in order to improve the following deficits and impairments:  Abnormal gait,Decreased coordination,Decreased range of motion,Difficulty walking,Impaired tone,Decreased endurance,Impaired UE functional use,Decreased activity tolerance,Impaired perceived functional ability,Decreased balance,Impaired flexibility,Decreased cognition,Decreased mobility,Decreased strength,Increased edema,Postural dysfunction,Pain  Visit Diagnosis: Hemiplegia and hemiparesis following cerebral infarction affecting left non-dominant side (HCC)  Difficulty in walking, not elsewhere classified  At risk for falls  Muscle  weakness (generalized)  Pain in left arm  Unsteadiness on feet  Other abnormalities of gait and mobility  Other lack of coordination     Problem List Patient Active Problem List   Diagnosis Date Noted  . Type 2 diabetes mellitus with hyperglycemia ([place]) 10/29/2020  . Diastolic dysfunction   . DKA (diabetic ketoacidosis) ([place]) 09/20/2020  . Abnormal EKG 08/16/2020  . CKD (chronic kidney disease) stage 2, GFR 60-89 ml/min 06/27/2020  . Monitoring for anticoagulant use 06/27/2020  . Lower extremity edema 05/16/2020  . Paroxysmal atrial fibrillation ([place]) 10/26/2019  . Coagulopathy ([place]) 06/12/2019  . Gastroesophageal reflux disease   . Hemiplegia and hemiparesis following cerebral infarction affecting left non-dominant side ([place]) 11/25/2018  . RBBB 10/15/2018  . Dysphagia, post-stroke   . Microalbuminuria 07/29/2018  . Hyperlipidemia LDL goal <70 02/11/2017  . Recurrent strokes ([place]) 11/10/2016  . Essential hypertension 11/10/2016  . Tobacco abuse 08/28/2013  . Peripheral neuropathy 08/28/2013   Pura Spice, PT, DPT # [phone] 11/22/2020, 8:10 AM  [facility] [address] [place], [place], [address] Phone: [phone]   Fax:  [phone]  Name: Evan KORVER Sr. MRN: [id number] Date of Birth: Sep 13, 1955

## 2020-11-19 NOTE — Telephone Encounter (Signed)
Requested medication (s) are due for refill today:  Provider to review  Requested medication (s) are on the active medication list:   Yes  Future visit scheduled:   Yes   Last ordered: 09/26/2020 #90, 0 refills  Non delegated refill   Requested Prescriptions  Pending Prescriptions Disp Refills   metoCLOPramide (REGLAN) 5 MG tablet [Pharmacy Med Name: METOCLOPRAMIDE HCL 5 MG TAB] 90 tablet 0    Sig: TAKE 1 TABLET BY MOUTH 3 TIMES DAILY BEFORE MEALS      Not Delegated - Gastroenterology: Antiemetics Failed - 11/19/2020 10:00 AM      Failed - This refill cannot be delegated      Passed - Valid encounter within last 6 months    Recent Outpatient Visits           3 weeks ago Type 2 diabetes mellitus with hyperglycemia, unspecified whether long term insulin use [facility])   [facility] Dense M, DO   1 month ago Type 2 diabetes mellitus with other circulatory complication, with long-term current use of insulin [facility])   [facility] Alba Cory, MD   2 months ago Encounter for examination following treatment at hospital   [facility] Welford Roche D, MD   4 months ago Type 2 diabetes mellitus with hyperglycemia, with long-term current use of insulin [facility])   [facility] Welford Roche D, MD   6 months ago Hemiplegia and hemiparesis following cerebral infarction affecting left non-dominant side [facility])   [facility] Jamelle Haring, MD       Future Appointments             In 1 month Carlynn Purl, Danna Hefty, MD [facility], [facility]   In 4 months End, Cristal Deer, MD [facility], [facility]

## 2020-11-25 ENCOUNTER — Ambulatory Visit (INDEPENDENT_AMBULATORY_CARE_PROVIDER_SITE_OTHER): Payer: Medicaid Other | Admitting: Podiatry

## 2020-11-25 ENCOUNTER — Other Ambulatory Visit: Payer: Self-pay

## 2020-11-25 ENCOUNTER — Encounter: Payer: Self-pay | Admitting: Podiatry

## 2020-11-25 DIAGNOSIS — N179 Acute kidney failure, unspecified: Secondary | ICD-10-CM | POA: Diagnosis not present

## 2020-11-25 DIAGNOSIS — E1142 Type 2 diabetes mellitus with diabetic polyneuropathy: Secondary | ICD-10-CM

## 2020-11-25 DIAGNOSIS — M79675 Pain in left toe(s): Secondary | ICD-10-CM | POA: Diagnosis not present

## 2020-11-25 DIAGNOSIS — M79674 Pain in right toe(s): Secondary | ICD-10-CM

## 2020-11-25 DIAGNOSIS — D689 Coagulation defect, unspecified: Secondary | ICD-10-CM

## 2020-11-25 DIAGNOSIS — B351 Tinea unguium: Secondary | ICD-10-CM | POA: Diagnosis not present

## 2020-11-25 DIAGNOSIS — N182 Chronic kidney disease, stage 2 (mild): Secondary | ICD-10-CM

## 2020-11-25 NOTE — Progress Notes (Signed)
This patient returns to my office for at risk foot care.  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 Patient is taking eliquis.  He is accompanied by his daughter..This patient is unable to cut nails himself since the patient cannot reach his nails.These nails are painful walking and wearing shoes.  This patient presents for at risk foot care today.  General Appearance  Alert, conversant and in no acute stress.  Vascular  Dorsalis pedis and posterior tibial  pulses are palpable  bilaterally.  Capillary return is within normal limits  bilaterally. Temperature is within normal limits  bilaterally.  Neurologic  Senn-Weinstein monofilament wire test within normal limits  bilaterally. Muscle power within normal limits bilaterally.  Nails Thick disfigured discolored nails with subungual debris  from hallux to fifth toes bilaterally. No evidence of bacterial infection or drainage bilaterally.  Orthopedic  No limitations of motion  feet .  No crepitus or effusions noted.  No bony pathology or digital deformities noted.  Skin  normotropic skin with no porokeratosis noted bilaterally.  No signs of infections or ulcers noted.  White cauliflower appearing tissue in arch right foot.  Asymptomatic.   Onychomycosis  Pain in right toes  Pain in left toes  Consent was obtained for treatment procedures.   Mechanical debridement of nails 1-5  bilaterally performed with a nail nipper.  Filed with dremel without incident. No infection or ulcer.     Return office visit   12 weeks                  Told patient to return for periodic foot care and evaluation due to potential at risk complications.   Helane Gunther DPM

## 2020-11-26 ENCOUNTER — Other Ambulatory Visit: Payer: Self-pay | Admitting: Family Medicine

## 2020-11-26 ENCOUNTER — Encounter: Payer: Self-pay | Admitting: Physical Therapy

## 2020-11-26 ENCOUNTER — Ambulatory Visit: Payer: Medicaid Other

## 2020-11-26 DIAGNOSIS — R2681 Unsteadiness on feet: Secondary | ICD-10-CM | POA: Diagnosis not present

## 2020-11-26 DIAGNOSIS — R2689 Other abnormalities of gait and mobility: Secondary | ICD-10-CM

## 2020-11-26 DIAGNOSIS — R278 Other lack of coordination: Secondary | ICD-10-CM | POA: Diagnosis not present

## 2020-11-26 DIAGNOSIS — R262 Difficulty in walking, not elsewhere classified: Secondary | ICD-10-CM

## 2020-11-26 DIAGNOSIS — Z9181 History of falling: Secondary | ICD-10-CM

## 2020-11-26 DIAGNOSIS — M79602 Pain in left arm: Secondary | ICD-10-CM | POA: Diagnosis not present

## 2020-11-26 DIAGNOSIS — I69354 Hemiplegia and hemiparesis following cerebral infarction affecting left non-dominant side: Secondary | ICD-10-CM | POA: Diagnosis not present

## 2020-11-26 DIAGNOSIS — M6281 Muscle weakness (generalized): Secondary | ICD-10-CM

## 2020-11-26 NOTE — Therapy (Signed)
[facility] [address] [place], [place], [address] Phone: [phone]   Fax:  [phone]  Physical Therapy Treatment  Patient Details  Name: Evan CAZAREZ Sr. MRN: [id number] Date of Birth: 07-04-1956 Referring Provider (PT): Lebron Conners MD   Encounter Date: 11/26/2020   PT End of Session - 11/26/20 1458    Visit Number 13    Number of Visits 24    Date for PT Re-Evaluation 12/12/20    Authorization - Visit Number 3    Authorization - Number of Visits 10    PT Start Time 1517    PT Stop Time 6160    PT Time Calculation (min) 42 min    Equipment Utilized During Treatment Gait belt    Activity Tolerance Patient tolerated treatment well    Behavior During Therapy WFL for tasks assessed/performed           Past Medical History:  Diagnosis Date  . Carotid arterial disease ([place])    a. 08/2018 Carotid U/S: min-mod RICA atherosclerosis w/o hemodynamically significant stenosis. Nl LICA.  . Diabetes 1.5, managed as type 2 ([place])   . Diastolic dysfunction    a. 08/2018 Echo: EF 65%. No rwma. Gr1 DD. Mild MR.  . Diastolic dysfunction    a. 08/2019 Echo: EF 55-60%, Gr1 DD. No rwma. Mild MR. RVSP 37.84mHg.  .Marland KitchenHypercholesterolemia   . Hypertension   . PAF (paroxysmal atrial fibrillation) ([place]    a. 10/2019 Event Monitor: PAF; b. CHA2DS2VASc = 5-->Eliquis.  .Marland KitchenPoorly controlled diabetes mellitus ([place]    a. 04/2019 A1c 13.8.  .Marland KitchenRecurrent strokes ([place]    a. 10/2016 MRI/A: Acute 552mR thalamic infarct, ? subacute infarct of R corona radiata; b. 08/2017 MRI/A: Acute 16m616materal L thalamic infarct. Other more remote lacunar infarcts of thalami bilat. Small vessel dzs; c. 08/2018 MRI/A: Acute lacunar infarct of the post limb of R internal capsule; d. 08/2019 MRI Acute CVA of L paramedian pons adn R cerebellar hemisphere.  . Tobacco abuse     Past Surgical History:  Procedure Laterality Date  . ESOPHAGOGASTRODUODENOSCOPY (EGD) WITH  PROPOFOL N/A 04/26/2019   Procedure: ESOPHAGOGASTRODUODENOSCOPY (EGD) WITH PROPOFOL;  Surgeon: VanLin LandsmanD;  [place];  Laterality: N/A;    There were no vitals filed for this visit.   Subjective Assessment - 11/26/20 1451    Subjective Pt amb into clinic with borrowed LSCAltoneturned to clinic and pt given [facility] falls or near falls using LSCPrunedale home. No pain.    Pertinent History Pertinent history and PMH: HTN, uncontrolled DM, HLD, R basal ganglia stroke on 08/2018 and L pointine and R cerebellar stroke 09/07/19, sinus bradycardia, peripheral neuropat    Patient Stated Goals Improve strengthening/ walking.  Return to fishing.    Currently in Pain? No/denies    Pain Onset More than a month ago            Gait training:   Pt educated on new LSCBlackhawkjustment and use first 5 min. Pt familiar with borrowed LSCReedsburgom previous week.   Amb with LSC along floor ladder to promote equal step lengths: x4 CGA. Slight L circumduction gait. VC for Increasing L hip flexion during swing phase on LLE.  Obstacle course navigation with LSC: cone weaves --> hurdle step overs (3) --> 1/2 bolster step over --> 6" step up. CGA, x4. Most difficulty with 6" step up/down and extra time  to complete.   Backwards walking in // bars to promote improved hip extension for carryover in terminal stance of gait. X6, SUE support. SBA  Side step marching to improve hip flexor strength for carryover in swing phase of gait. Also to improve lat hip stability strength on stance limb for gait carryover. X6. SBA Mod VC and use of mirror and PT demo for improved carryover.   Reassessment of gait with 50' ambulation with LSC. Mod VC for improving hip flexion on LLE for improved foot clearance in swing phase of gait to reduce scuffing of L foot. Fair carryover after ucing but improved, equal step lengths.     Seated rest throughout session due to LE fatigue.  Pt requires  multimodal cuing at moderate levels for carryover with tasks in session. Pt displaying independence with safe STS and stand to sit transfers with [facility].     PT Education - 11/26/20 1601    Education Details form/technique with exercise.    Person(s) Educated Patient    Methods Explanation;Demonstration    Comprehension Verbalized understanding;Returned demonstration;Need further instruction               PT Long Term Goals - 11/15/20 1203      PT LONG TERM GOAL #1   Title Pt will increase Berg Balance to 48/56 to demonstrate clinically significant improvement in balance decreasing pt's risk for falls during functional activities.    Baseline 2/3: 42/56    Time 12    Period Weeks    Status On-going    Target Date 12/12/20      PT LONG TERM GOAL #2   Title Pt will improve 10 m gait speed to > 0.5 m/s  with LRAD to improve safe community ambulation.    Baseline 2/3: 0.32 m/s with SPC and SBA/CGA    Time 12    Period Weeks    Status Not Met    Target Date 12/12/20      PT LONG TERM GOAL #3   Title Pt will improve 5xSTS to < 12 sec with no RUE support to demonstrate clinically significant improvement in BLE strength to improve STS t/f's like standing from the toilet.    Baseline 2/3: 13.5 sec.    Time 12    Period Weeks    Status On-going    Target Date 12/12/20      PT LONG TERM GOAL #4   Title Pt will improve DGI by a minimum of 5 points to demonstrate clinically significant improvement and safety with community ambulation tasks to decrease risk of falls.    Baseline TBD    Time 12    Period Weeks    Status Not Met    Target Date 12/12/20                 Plan - 11/26/20 1645    Clinical Impression Statement Progressed LSC ambulation to obstacle navigation with no LOB. Pt displayed ability to weave, step over, and step onto obstacles safely with no LOB. CGA for safety purposes. COntinuing to further improve pt's step lengths with 2 point gait requiring  consistent VC and visual cues for carryover. Pt given new [place] he can keep and PT gym [facility] has been returned. PT set up [facility] to appropriate height for pt. Can benefit from further skilled PT services to optimize gait and mobility to reduce risk of falls and maximize capacity to complete ADL's.    Personal Factors and Comorbidities Age;Comorbidity 3+;Education;Finances;Past/Current  Experience;Fitness;Transportation    Comorbidities HTN, uncontrolled DM, HLD, R basal ganglia stroke on 08/2018 and L pointine and R cerebellar stroke 09/07/19, sinus bradycardia, peripheral neuropathy    Examination-Activity Limitations Bathing;Bed Mobility;Dressing;Transfers;Squat;Lift;Locomotion Level;Stairs;Reach Overhead;Carry;Stand;Sit    Examination-Participation Restrictions Community Activity;Interpersonal Relationship;Yard Work    Merchant navy officer Evolving/Moderate complexity    Rehab Potential Fair    PT Frequency 2x / week    PT Duration 12 weeks    PT Treatment/Interventions ADLs/Self Care Home Management;Aquatic Therapy;Electrical Stimulation;Moist Heat;DME Instruction;Gait training;Stair training;Functional mobility training;Therapeutic activities;Therapeutic exercise;Balance training;Neuromuscular re-education;Patient/family education;Orthotic Fit/Training;Dry needling;Manual techniques;Canalith Repostioning;Passive range of motion;Biofeedback    PT Next Visit Plan L UE/LE strengthening/ dynamic balance tasks.  GIVE Pt. LOFTSTRAND CRUTCH (located on shelving unit)    PT Home Exercise Plan HEP: standing hip marches/abd, ankle DF/PF in sitting/supine, passive L elbow stretch for tone    Consulted and Agree with Plan of Care Patient    Family Member Consulted wife           Patient will benefit from skilled therapeutic intervention in order to improve the following deficits and impairments:  Abnormal gait,Decreased coordination,Decreased range of motion,Difficulty walking,Impaired tone,Decreased  endurance,Impaired UE functional use,Decreased activity tolerance,Impaired perceived functional ability,Decreased balance,Impaired flexibility,Decreased cognition,Decreased mobility,Decreased strength,Increased edema,Postural dysfunction,Pain  Visit Diagnosis: Hemiplegia and hemiparesis following cerebral infarction affecting left non-dominant side (HCC)  Other abnormalities of gait and mobility  Difficulty in walking, not elsewhere classified  Other lack of coordination  At risk for falls  Muscle weakness (generalized)     Problem List Patient Active Problem List   Diagnosis Date Noted  . Type 2 diabetes mellitus with hyperglycemia ([place]) 10/29/2020  . Diastolic dysfunction   . DKA (diabetic ketoacidosis) ([place]) 09/20/2020  . Abnormal EKG 08/16/2020  . CKD (chronic kidney disease) stage 2, GFR 60-89 ml/min 06/27/2020  . Monitoring for anticoagulant use 06/27/2020  . Lower extremity edema 05/16/2020  . Paroxysmal atrial fibrillation ([place]) 10/26/2019  . Coagulopathy ([place]) 06/12/2019  . Gastroesophageal reflux disease   . Hemiplegia and hemiparesis following cerebral infarction affecting left non-dominant side ([place]) 11/25/2018  . RBBB 10/15/2018  . Dysphagia, post-stroke   . Microalbuminuria 07/29/2018  . Hyperlipidemia LDL goal <70 02/11/2017  . Recurrent strokes ([place]) 11/10/2016  . Essential hypertension 11/10/2016  . Tobacco abuse 08/28/2013  . Peripheral neuropathy 08/28/2013    Salem Caster. Fairly IV, PT, DPT Physical Therapist- [facility]  11/26/2020, 4:57 PM  [facility] [address] [place], [place], [address] Phone: [phone]   Fax:  [phone]  Name: Evan Cones Sr. MRN: [id number] Date of Birth: 1955/10/23

## 2020-11-26 NOTE — Telephone Encounter (Signed)
   Notes to clinic:  medication filled by a historical provider  Review for continued use and refill    Requested Prescriptions  Pending Prescriptions Disp Refills   omeprazole (PRILOSEC) 40 MG capsule [Pharmacy Med Name: OMEPRAZOLE DR 40 MG CAP] 30 capsule     Sig: TAKE 1 CAPSULE BY MOUTH DAILY BEFORE BREAKFAST      Gastroenterology: Proton Pump Inhibitors Passed - 11/26/2020 12:40 PM      Passed - Valid encounter within last 12 months    Recent Outpatient Visits           4 weeks ago Type 2 diabetes mellitus with hyperglycemia, unspecified whether long term insulin use [facility])   [facility] Dense M, DO   2 months ago Type 2 diabetes mellitus with other circulatory complication, with long-term current use of insulin [facility])   [facility] Alba Cory, MD   3 months ago Encounter for examination following treatment at hospital   [facility] Welford Roche D, MD   5 months ago Type 2 diabetes mellitus with hyperglycemia, with long-term current use of insulin [facility])   [facility] Welford Roche D, MD   6 months ago Hemiplegia and hemiparesis following cerebral infarction affecting left non-dominant side [facility])   [facility] Jamelle Haring, MD       Future Appointments             In 4 weeks Alba Cory, MD [facility], [facility]   In 4 months End, Cristal Deer, MD [facility]

## 2020-11-29 ENCOUNTER — Other Ambulatory Visit: Payer: Self-pay | Admitting: Family Medicine

## 2020-11-29 DIAGNOSIS — Z794 Long term (current) use of insulin: Secondary | ICD-10-CM

## 2020-11-29 DIAGNOSIS — E1165 Type 2 diabetes mellitus with hyperglycemia: Secondary | ICD-10-CM

## 2020-12-02 NOTE — Telephone Encounter (Signed)
Last appt:10/28/20 Next:5/14

## 2020-12-03 ENCOUNTER — Other Ambulatory Visit: Payer: Self-pay | Admitting: *Deleted

## 2020-12-03 ENCOUNTER — Ambulatory Visit: Payer: Medicaid Other | Admitting: Physical Therapy

## 2020-12-03 ENCOUNTER — Other Ambulatory Visit: Payer: Self-pay

## 2020-12-03 DIAGNOSIS — M6281 Muscle weakness (generalized): Secondary | ICD-10-CM | POA: Diagnosis not present

## 2020-12-03 DIAGNOSIS — M79602 Pain in left arm: Secondary | ICD-10-CM | POA: Diagnosis not present

## 2020-12-03 DIAGNOSIS — R2689 Other abnormalities of gait and mobility: Secondary | ICD-10-CM | POA: Diagnosis not present

## 2020-12-03 DIAGNOSIS — R278 Other lack of coordination: Secondary | ICD-10-CM

## 2020-12-03 DIAGNOSIS — R262 Difficulty in walking, not elsewhere classified: Secondary | ICD-10-CM | POA: Diagnosis not present

## 2020-12-03 DIAGNOSIS — I69354 Hemiplegia and hemiparesis following cerebral infarction affecting left non-dominant side: Secondary | ICD-10-CM

## 2020-12-03 DIAGNOSIS — R2681 Unsteadiness on feet: Secondary | ICD-10-CM

## 2020-12-03 DIAGNOSIS — Z9181 History of falling: Secondary | ICD-10-CM | POA: Diagnosis not present

## 2020-12-03 NOTE — Patient Outreach (Signed)
Care Coordination  12/03/2020  Evan Townsend Sr. [birth year] [id number]   Successful outreach today with [name] DPR, Ms. Raul Del. She requested to reschedule due to [name] being in therapy at this time. A new date and time for 12/10/20 @ 3:30pm was scheduled.   Estanislado Emms RN, BSN [place]  [employer]

## 2020-12-06 ENCOUNTER — Encounter: Payer: Self-pay | Admitting: Physical Therapy

## 2020-12-06 NOTE — Therapy (Signed)
[facility] [address] [place], [place], [address] Phone: [phone]   Fax:  [phone]  Physical Therapy Treatment  Patient Details  Name: Evan VENCES Sr. MRN: [id number] Date of Birth: 1956-04-26 Referring Provider (PT): Lebron Conners MD   Encounter Date: 12/03/2020   PT End of Session - 12/06/20 2002    Visit Number 14    Number of Visits 24    Date for PT Re-Evaluation 12/12/20    Authorization - Visit Number 4    Authorization - Number of Visits 10    PT Start Time 9449    PT Stop Time 1504    PT Time Calculation (min) 68 min    Equipment Utilized During Treatment Gait belt    Activity Tolerance Patient tolerated treatment well    Behavior During Therapy WFL for tasks assessed/performed           Past Medical History:  Diagnosis Date  . Carotid arterial disease ([place])    a. 08/2018 Carotid U/S: min-mod RICA atherosclerosis w/o hemodynamically significant stenosis. Nl LICA.  . Diabetes 1.5, managed as type 2 ([place])   . Diastolic dysfunction    a. 08/2018 Echo: EF 65%. No rwma. Gr1 DD. Mild MR.  . Diastolic dysfunction    a. 08/2019 Echo: EF 55-60%, Gr1 DD. No rwma. Mild MR. RVSP 37.42mHg.  .Marland KitchenHypercholesterolemia   . Hypertension   . PAF (paroxysmal atrial fibrillation) ([place]    a. 10/2019 Event Monitor: PAF; b. CHA2DS2VASc = 5-->Eliquis.  .Marland KitchenPoorly controlled diabetes mellitus ([place]    a. 04/2019 A1c 13.8.  .Marland KitchenRecurrent strokes (HS[place]    a. 10/2016 MRI/A: Acute 571mR thalamic infarct, ? subacute infarct of R corona radiata; b. 08/2017 MRI/A: Acute 15m35materal L thalamic infarct. Other more remote lacunar infarcts of thalami bilat. Small vessel dzs; c. 08/2018 MRI/A: Acute lacunar infarct of the post limb of R internal capsule; d. 08/2019 MRI Acute CVA of L paramedian pons adn R cerebellar hemisphere.  . Tobacco abuse     Past Surgical History:  Procedure Laterality Date  . ESOPHAGOGASTRODUODENOSCOPY (EGD) WITH  PROPOFOL N/A 04/26/2019   Procedure: ESOPHAGOGASTRODUODENOSCOPY (EGD) WITH PROPOFOL;  Surgeon: VanLin LandsmanD;  [place];  Laterality: N/A;    There were no vitals filed for this visit.   Subjective Assessment - 12/06/20 1956    Subjective Pt. states he has really preferred using [place] crutch compared to [facility] preferred the loaner [facility].  PT [facility] out [facility] pt. to improve gait/ safety with walking.  No pain reported prior to tx. session.  Pt. states he is walking short distances at home with no assistive device.    Pertinent History Pertinent history and PMH: HTN, uncontrolled DM, HLD, R basal ganglia stroke on 08/2018 and L pointine and R cerebellar stroke 09/07/19, sinus bradycardia, peripheral neuropat    Patient Stated Goals Improve strengthening/ walking.  Return to fishing.    Currently in Pain? No/denies    Pain Onset More than a month ago          There.ex.:  Nustep L4 B UE/LE 10 min. (focus on L hand grasp/ elbow extension)  Supine B shoulder A/AROM with PT assist.  Static L elbow extension/ stretches (MH to L shoulder/ elbow)  Standing marching 20x.   Neuro:  //-bars: forward/ backward/ lateral walking with 1 UE assist progressing to no UE assist 5x each.  MirPharmacist, hospital  Cuing for posture correction.  Wt. Shifting L/R in //-bars with no UE assist.  Standing step touches/ turning/ head turns.  Posture correction with gait/ balance tasks.     Gait training:           Amb. In clinic with/ without use of [place] working on consistent recip. Step pattern/ maintaining proper BOS/ step pattern/ heel strike.  Walking outside to transport vehicle managing front door mat/ threshold.   Seated rest throughout session due to LE fatigue.  Pt requires multimodal cuing at moderate levels for carryover with tasks in session. Pt displaying independence with safe STS and stand to sit transfers with [facility].   Discuss  insurance visit limit next tx. Session.       PT Long Term Goals - 11/15/20 1203      PT LONG TERM GOAL #1   Title Pt will increase Berg Balance to 48/56 to demonstrate clinically significant improvement in balance decreasing pt's risk for falls during functional activities.    Baseline 2/3: 42/56    Time 12    Period Weeks    Status On-going    Target Date 12/12/20      PT LONG TERM GOAL #2   Title Pt will improve 10 m gait speed to > 0.5 m/s  with LRAD to improve safe community ambulation.    Baseline 2/3: 0.32 m/s with SPC and SBA/CGA    Time 12    Period Weeks    Status Not Met    Target Date 12/12/20      PT LONG TERM GOAL #3   Title Pt will improve 5xSTS to < 12 sec with no RUE support to demonstrate clinically significant improvement in BLE strength to improve STS t/f's like standing from the toilet.    Baseline 2/3: 13.5 sec.    Time 12    Period Weeks    Status On-going    Target Date 12/12/20      PT LONG TERM GOAL #4   Title Pt will improve DGI by a minimum of 5 points to demonstrate clinically significant improvement and safety with community ambulation tasks to decrease risk of falls.    Baseline TBD    Time 12    Period Weeks    Status Not Met    Target Date 12/12/20                 Plan - 12/06/20 2002    Clinical Impression Statement Pt. requires SBA/CGA for safety with dynamic tasks/ gait training with no assistive device.  Pt. had 1 episodes of scissoring gait requiring min. A while turning in clinic without use of assistive device.  Significant L shoulder/ elbow rigidity and static hold stretching performed/ recommended at home.  Increase step pattern/ length noted during //-bars walking forward and lateral directions.    Personal Factors and Comorbidities Age;Comorbidity 3+;Education;Finances;Past/Current Experience;Fitness;Transportation    Comorbidities HTN, uncontrolled DM, HLD, R basal ganglia stroke on 08/2018 and L pointine and R cerebellar  stroke 09/07/19, sinus bradycardia, peripheral neuropathy    Examination-Activity Limitations Bathing;Bed Mobility;Dressing;Transfers;Squat;Lift;Locomotion Level;Stairs;Reach Overhead;Carry;Stand;Sit    Examination-Participation Restrictions Community Activity;Interpersonal Relationship;Yard Work    Merchant navy officer Evolving/Moderate complexity    Clinical Decision Making Moderate    Rehab Potential Fair    PT Frequency 2x / week    PT Duration 12 weeks    PT Treatment/Interventions ADLs/Self Care Home Management;Aquatic Therapy;Electrical Stimulation;Moist Heat;DME Instruction;Gait training;Stair training;Functional mobility training;Therapeutic activities;Therapeutic exercise;Balance training;Neuromuscular re-education;Patient/family education;Orthotic Fit/Training;Dry  needling;Manual techniques;Canalith Repostioning;Passive range of motion;Biofeedback    PT Next Visit Plan L UE/LE strengthening/ dynamic balance tasks.    PT Home Exercise Plan HEP: standing hip marches/abd, ankle DF/PF in sitting/supine, passive L elbow stretch for tone    Consulted and Agree with Plan of Care Patient    Family Member Consulted wife           Patient will benefit from skilled therapeutic intervention in order to improve the following deficits and impairments:  Abnormal gait,Decreased coordination,Decreased range of motion,Difficulty walking,Impaired tone,Decreased endurance,Impaired UE functional use,Decreased activity tolerance,Impaired perceived functional ability,Decreased balance,Impaired flexibility,Decreased cognition,Decreased mobility,Decreased strength,Increased edema,Postural dysfunction,Pain  Visit Diagnosis: Hemiplegia and hemiparesis following cerebral infarction affecting left non-dominant side (HCC)  Other abnormalities of gait and mobility  Difficulty in walking, not elsewhere classified  Other lack of coordination  At risk for falls  Muscle weakness  (generalized)  Pain in left arm  Unsteadiness on feet     Problem List Patient Active Problem List   Diagnosis Date Noted  . Type 2 diabetes mellitus with hyperglycemia ([place]) 10/29/2020  . Diastolic dysfunction   . DKA (diabetic ketoacidosis) ([place]) 09/20/2020  . Abnormal EKG 08/16/2020  . CKD (chronic kidney disease) stage 2, GFR 60-89 ml/min 06/27/2020  . Monitoring for anticoagulant use 06/27/2020  . Lower extremity edema 05/16/2020  . Paroxysmal atrial fibrillation ([place]) 10/26/2019  . Coagulopathy ([place]) 06/12/2019  . Gastroesophageal reflux disease   . Hemiplegia and hemiparesis following cerebral infarction affecting left non-dominant side ([place]) 11/25/2018  . RBBB 10/15/2018  . Dysphagia, post-stroke   . Microalbuminuria 07/29/2018  . Hyperlipidemia LDL goal <70 02/11/2017  . Recurrent strokes ([place]) 11/10/2016  . Essential hypertension 11/10/2016  . Tobacco abuse 08/28/2013  . Peripheral neuropathy 08/28/2013   Pura Spice, PT, DPT # [phone] 12/06/2020, 8:21 PM  [facility] [address] [place], [place], [address] Phone: [phone]   Fax:  [phone]  Name: Evan SCHUELER Sr. MRN: [id number] Date of Birth: 1955/12/04

## 2020-12-10 ENCOUNTER — Other Ambulatory Visit: Payer: Self-pay

## 2020-12-10 ENCOUNTER — Other Ambulatory Visit: Payer: Self-pay | Admitting: *Deleted

## 2020-12-10 ENCOUNTER — Ambulatory Visit: Payer: Medicaid Other | Admitting: Physical Therapy

## 2020-12-10 DIAGNOSIS — R2689 Other abnormalities of gait and mobility: Secondary | ICD-10-CM

## 2020-12-10 DIAGNOSIS — M6281 Muscle weakness (generalized): Secondary | ICD-10-CM | POA: Diagnosis not present

## 2020-12-10 DIAGNOSIS — I69354 Hemiplegia and hemiparesis following cerebral infarction affecting left non-dominant side: Secondary | ICD-10-CM | POA: Diagnosis not present

## 2020-12-10 DIAGNOSIS — R278 Other lack of coordination: Secondary | ICD-10-CM | POA: Diagnosis not present

## 2020-12-10 DIAGNOSIS — M79602 Pain in left arm: Secondary | ICD-10-CM | POA: Diagnosis not present

## 2020-12-10 DIAGNOSIS — Z9181 History of falling: Secondary | ICD-10-CM

## 2020-12-10 DIAGNOSIS — R262 Difficulty in walking, not elsewhere classified: Secondary | ICD-10-CM | POA: Diagnosis not present

## 2020-12-10 DIAGNOSIS — R2681 Unsteadiness on feet: Secondary | ICD-10-CM | POA: Diagnosis not present

## 2020-12-10 NOTE — Patient Outreach (Signed)
Medicaid Managed Care   Nurse Care Manager Note  12/10/2020 Name:  Evan BAUDOIN Sr. MRN:  [id number] DOB:  [birth year]  Evan Cones Sr. is an 65 y.o. year old male who is a primary patient of Steele Sizer, MD.  The [facility] Managed Care Coordination team was consulted for assistance with:    DMII and hemiplegia  [name] was given information about Medicaid Managed Care Coordination team services today. Evan Cones Sr. agreed to services and verbal consent obtained.  Engaged with patient by telephone for initial visit in response to provider referral for case management and/or care coordination services.   Assessments/Interventions:  Review of past medical history, allergies, medications, health status, including review of consultants reports, laboratory and other test data, was performed as part of comprehensive evaluation and provision of chronic care management services.  SDOH (Social Determinants of Health) assessments and interventions performed:   Care Plan  No Known Allergies  Medications Reviewed Today    Reviewed by Melissa Montane, RN (Registered Nurse) on 12/10/20 at [place] Status: <None>  Medication Order Taking? Sig Documenting Provider Last Dose Status Informant  ACCU-CHEK GUIDE test strip [id number] Yes  [provider] Taking Active Spouse/Significant Other  Accu-Chek Softclix Lancets lancets [id number] Yes SMARTSIG:Topical [provider] Taking Active Spouse/Significant Other  amLODipine (NORVASC) 10 MG tablet [id number] Yes Take 1 tablet (10 mg total) by mouth daily. End, Harrell Gave, MD Taking Active Spouse/Significant Other  apixaban (ELIQUIS) 5 MG TABS tablet [id number] Yes Take 1 tablet (5 mg total) by mouth 2 (two) times daily. End, Harrell Gave, MD Taking Active Spouse/Significant Other  aspirin 81 MG EC tablet [id number] No Take 1 tablet by mouth daily.  Patient not taking: Reported on 12/10/2020   [provider] Not  Taking Active   atorvastatin (LIPITOR) 80 MG tablet [id number] Yes Take 1 tablet (80 mg total) by mouth daily. Steele Sizer, MD Taking Active   Blood Glucose Monitoring Suppl (FIFTY50 GLUCOSE METER 2.0) w/Device KIT [id number] Yes Use as instructed [provider] Taking Active Spouse/Significant Other  Blood Glucose Monitoring Suppl KIT [id number] Yes Accucheck Guide with lancets and test strips #100 with one refill.  Test blood sugar twice daily. DX Code E11.65 ; Z79.4 Towanda Malkin, MD Taking Active Spouse/Significant Other  Continuous Blood Gluc Receiver (FREESTYLE LIBRE 2 READER) [place] [id number] Yes 1 each by Does not apply route daily. Steele Sizer, MD Taking Active   Continuous Blood Gluc Sensor (FREESTYLE LIBRE 2 SENSOR) [place] [id number] Yes 1 each by Does not apply route every 14 (fourteen) days. Steele Sizer, MD Taking Active   dapagliflozin propanediol (FARXIGA) 10 MG TABS tablet [id number] Yes Take 1 tablet (10 mg total) by mouth daily before breakfast. In place of glipizide Myles Gip, DO Taking Active   gabapentin (NEURONTIN) 300 MG capsule [id number] Yes TAKE TWO CAPSULES BY MOUTH TWICE A Bernette Mayers, Drue Stager, MD Taking Active   Glucagon (GVOKE HYPOPEN 1-PACK) 1 MG/0.2ML SOAJ [id number] No Inject 1 each into the skin daily as needed.  Patient not taking: Reported on 12/10/2020   Steele Sizer, MD Not Taking Active   hydrochlorothiazide (HYDRODIURIL) 25 MG tablet [id number] No Take 1 tablet (25 mg total) by mouth daily.  Patient not taking: Reported on 12/10/2020   End, Harrell Gave, MD Not Taking Active Spouse/Significant Other  insulin detemir (LEVEMIR) 100 UNIT/ML injection [id number] Yes Inject 35 Units into the skin daily. [provider] Taking Active   Insulin Pen Needle 32G  X 6 MM MISC [id number] Yes Daily Steele Sizer, MD Taking Active   lisinopril (ZESTRIL) 40 MG tablet [id number] Yes Take 1 tablet (40 mg total) by mouth daily. End, Harrell Gave,  MD Taking Active Spouse/Significant Other  metoCLOPramide (REGLAN) 5 MG tablet [id number] Yes TAKE 1 TABLET BY MOUTH 3 TIMES DAILY BEFORE MEALS Sowles, Drue Stager, MD Taking Active   Misc. Devices MISC [id number] No One pair of Compression stockings  Hemiplegia and hemiparesis following cerebral infarction affecting left non-dominant side ([place]) - Primary Codes: J18.841 Lower extremity edema  Codes: R60.0 Type 2 diabetes mellitus with hyperglycemia, with long-term current use of insulin ([place])  Codes: E11.65, Z79.4  Patient not taking: Reported on 12/10/2020   Towanda Malkin, MD Not Taking Active Spouse/Significant Other  omeprazole (PRILOSEC) 40 MG capsule [id number] Yes Take 40 mg by mouth in the morning and at bedtime. [provider] Taking Active   pantoprazole (PROTONIX) 20 MG tablet [id number] Yes Take 20 mg by mouth 2 (two) times daily. [provider] Taking Active           Patient Active Problem List   Diagnosis Date Noted  . Type 2 diabetes mellitus with hyperglycemia ([place]) 10/29/2020  . Diastolic dysfunction   . DKA (diabetic ketoacidosis) ([place]) 09/20/2020  . Abnormal EKG 08/16/2020  . CKD (chronic kidney disease) stage 2, GFR 60-89 ml/min 06/27/2020  . Monitoring for anticoagulant use 06/27/2020  . Lower extremity edema 05/16/2020  . Paroxysmal atrial fibrillation ([place]) 10/26/2019  . Coagulopathy ([place]) 06/12/2019  . Gastroesophageal reflux disease   . Hemiplegia and hemiparesis following cerebral infarction affecting left non-dominant side ([place]) 11/25/2018  . RBBB 10/15/2018  . Dysphagia, post-stroke   . Microalbuminuria 07/29/2018  . Hyperlipidemia LDL goal <70 02/11/2017  . Recurrent strokes ([place]) 11/10/2016  . Essential hypertension 11/10/2016  . Tobacco abuse 08/28/2013  . Peripheral neuropathy 08/28/2013    Conditions to be addressed/monitored per PCP order:  DMII and hemiplegia  Care Plan : Diabetes Type 2 (Adult)  Updates made by  Melissa Montane, RN since 12/10/2020 12:00 AM    Problem: Glycemic Management (Diabetes, Type 2)     Long-Range Goal: Glycemic Management Optimized   Start Date: 12/10/2020  Expected End Date: 02/07/2021  This Visit's Progress: On track  Priority: High  Note:   Objective:  Lab Results  Component Value Date   HGBA1C 8.4 (H) 09/20/2020 .   Lab Results  Component Value Date   CREATININE 1.18 10/03/2020   CREATININE 0.89 09/21/2020   CREATININE 1.05 09/21/2020 .   Marland Kitchen No results found for: EGFR Current Barriers:  Marland Kitchen Knowledge Deficits related to basic Diabetes pathophysiology and self care/management-Mr. Shostak manages his diabetes with the help of his wife, Rise Paganini. He has muscle weakness after cerebral infarction. He recently started using a CGM. Rise Paganini makes sure that he has all of his medications, prepares all of his meals and helps him with dressing and bathing. He had a recent hospitalization for DKA. Average BS readings are around 130. Rise Paganini would like a shower chair and bedside commode to assist in Mr. Brenning's care. . Unable to perform ADLs independently . Unable to perform IADLs independently Case Manager Clinical Goal(s):  . patient will demonstrate improved adherence to prescribed treatment plan for diabetes self care/management as evidenced by:  . daily monitoring and recording of CBG   . adherence to ADA/ carb modified diet  . adherence to prescribed medication regimen  . contacting provider for new or worsened symptoms or  questions Interventions:  . Inter-disciplinary care team collaboration (see longitudinal plan of care) . Reviewed medications with patient and discussed importance of medication adherence . Discussed plans with patient for ongoing care management follow up and provided patient with direct contact information for care management team . Provided patient with written educational materials related to hypo and hyperglycemia and importance of correct treatment,  diabetic diet . Reviewed scheduled/upcoming provider appointments  . Referral made to pharmacy team for assistance with medication management . Review of patient status, including review of consultants reports, relevant laboratory and other test results, and medications completed. Nash Dimmer with PCP for bedside commode and shower chair  Self-Care Activities - Attends all scheduled provider appointments - Checks blood sugars as prescribed and utilize hyper and hypoglycemia protocol as needed - Adheres to prescribed ADA/carb modified Patient Goals:  Follow Up Plan: Telephone follow up appointment with care management team member scheduled for:01/16/21 @ 3:30pm      Follow Up:  Patient agrees to Care Plan and Follow-up.  Plan: The Managed Medicaid care management team will reach out to the patient again over the next 30 days.  Date/time of next scheduled RN care management/care coordination outreach: 01/16/21 @ 3:30pm  Lurena Joiner RN, [place] RN Care Coordinator

## 2020-12-10 NOTE — Therapy (Addendum)
[facility] [address] [place], [place], [address] Phone: [phone]   Fax:  [phone]  Physical Therapy Treatment  Patient Details  Name: Evan Townsend Sr. MRN: [id number] Date of Birth: 1956/01/21 Referring Provider (PT): Lebron Conners MD   Encounter Date: 12/10/2020   PT End of Session - 12/13/20 1458     Visit Number 15    Number of Visits 24    Date for PT Re-Evaluation 12/12/20    Authorization - Visit Number 14    Authorization - Number of Visits 15 (15 visits approved 2/10-5/24)    PT Start Time 1409    PT Stop Time 1524    PT Time Calculation (min) 75 min    Equipment Utilized During Treatment Gait belt    Activity Tolerance Patient tolerated treatment well    Behavior During Therapy WFL for tasks assessed/performed             Past Medical History:  Diagnosis Date   Carotid arterial disease ([place])    a. 08/2018 Carotid U/S: min-mod RICA atherosclerosis w/o hemodynamically significant stenosis. Nl LICA.   Diabetes 1.5, managed as type 2 ([place])    Diastolic dysfunction    a. 08/2018 Echo: EF 65%. No rwma. Gr1 DD. Mild MR.   Diastolic dysfunction    a. 08/2019 Echo: EF 55-60%, Gr1 DD. No rwma. Mild MR. RVSP 37.82mHg.   Hypercholesterolemia    Hypertension    PAF ([place]oxysmal atrial fibrillation) ([place]    a. 10/2019 Event Monitor: PAF; b. CHA2DS2VASc = 5-->Eliquis.   Poorly controlled diabetes mellitus ([place]    a. 04/2019 A1c 13.8.   Recurrent strokes ([place]    a. 10/2016 MRI/A: Acute 577mR thalamic infarct, ? subacute infarct of R corona radiata; b. 08/2017 MRI/A: Acute 41m26materal L thalamic infarct. Other more remote lacunar infarcts of thalami bilat. Small vessel dzs; c. 08/2018 MRI/A: Acute lacunar infarct of the post limb of R internal capsule; d. 08/2019 MRI Acute CVA of L paramedian pons adn R cerebellar hemisphere.   Tobacco abuse     Past Surgical History:  Procedure Laterality Date    ESOPHAGOGASTRODUODENOSCOPY (EGD) WITH PROPOFOL N/A 04/26/2019   Procedure: ESOPHAGOGASTRODUODENOSCOPY (EGD) WITH PROPOFOL;  Surgeon: VanLin LandsmanD;  [facility];  Service: Gastroenterology;  Laterality: N/A;    There were no vitals filed for this visit.   Subjective Assessment - 12/13/20 1457     Subjective Pt. reports no falls/ no new complaints.  Pt. states he is staying active at home.    Pertinent History Pertinent history and PMH: HTN, uncontrolled DM, HLD, R basal ganglia stroke on 08/2018 and L pointine and R cerebellar stroke 09/07/19, sinus bradycardia, peripheral neuropat    Patient Stated Goals Improve strengthening/ walking.  Return to fishing.    Currently in Pain? No/denies    Pain Onset More than a month ago               There.ex.:   Nustep L4 B UE/LE 10 min. (focus on L hand grasp/ elbow extension)  Standing marching/ hip abduction 20x.  Supine SLR/ heel slides/ bolster bridging/ hip abduction with manual resistance 20x.   Standing lunges with holds at //-bars 10x each.     Manual tx.:   Supine L shoulder AA/PROM with PT assist.  Static L elbow extension/ stretches (MH to L shoulder/ elbow)- STM to L distal bicep during stretches.  Neuro:   //-bars: forward/ backward/ lateral walking with 1 UE assist progressing to no UE assist 5x each.  Pharmacist, hospital.  Cuing for posture correction.   Wt. Shifting L/R in //-bars with no UE assist.   Step ups/ downs with 1 UE assist.  Focus on L hip flexion/ foot placement.    Posture correction with gait/ balance tasks.             Amb. In clinic with/ without use of [place] working on consistent recip. Step pattern/ maintaining proper BOS/ step pattern/ heel strike.  Walking outside to transport vehicle managing front door mat/ threshold.        Seated rest throughout session due to LE fatigue.  Pt requires multimodal cuing at moderate levels for carryover with tasks in session. Pt  displaying independence with safe STS and stand to sit transfers with [facility].     D  PT Long Term Goals - 11/15/20 1203       PT LONG TERM GOAL #1   Title Pt will increase Berg Balance to 48/56 to demonstrate clinically significant improvement in balance decreasing pt's risk for falls during functional activities.    Baseline 2/3: 42/56    Time 12    Period Weeks    Status On-going    Target Date 12/12/20      PT LONG TERM GOAL #2   Title Pt will improve 10 m gait speed to > 0.5 m/s  with LRAD to improve safe community ambulation.    Baseline 2/3: 0.32 m/s with SPC and SBA/CGA    Time 12    Period Weeks    Status Not Met    Target Date 12/12/20      PT LONG TERM GOAL #3   Title Pt will improve 5xSTS to < 12 sec with no RUE support to demonstrate clinically significant improvement in BLE strength to improve STS t/f's like standing from the toilet.    Baseline 2/3: 13.5 sec.    Time 12    Period Weeks    Status On-going    Target Date 12/12/20      PT LONG TERM GOAL #4   Title Pt will improve DGI by a minimum of 5 points to demonstrate clinically significant improvement and safety with community ambulation tasks to decrease risk of falls.    Baseline TBD    Time 12    Period Weeks    Status Not Met    Target Date 12/12/20                   Plan - 12/13/20 1500     Clinical Impression Statement First half of tx. focused on L shoulder/ elbow ROM and scapular mobility.  Significant L bicep tightness holding elbow at 90 deg. flexion.  Pt. can passively stretch L elbow to 110 deg. but no active carryover.  Pt. may benefit from short-term of brace to assist with walking/ arm swing during gait for balance.  Pt. ambulating short distances in clinic with no assistive device but pt. recommends SBA due to occasional misstep and difficulty with self-correction.  Pt. will continue to work on LE strengthening to improve walking/ safety with gait.    Personal Factors and  Comorbidities Age;Comorbidity 3+;Education;Finances;Past/Current Experience;Fitness;Transportation    Comorbidities HTN, uncontrolled DM, HLD, R basal ganglia stroke on 08/2018 and L pointine and R cerebellar stroke 09/07/19, sinus bradycardia, peripheral neuropathy    Examination-Activity Limitations Bathing;Bed Mobility;Dressing;Transfers;Squat;Lift;Locomotion Level;Stairs;Reach Overhead;Carry;Stand;Sit  Examination-Participation Restrictions Community Activity;Interpersonal Relationship;Yard Work    Merchant [employer] officer Evolving/Moderate complexity    Clinical Decision Making Moderate    Rehab Potential Fair    PT Frequency 2x / week    PT Duration 12 weeks    PT Treatment/Interventions ADLs/Self Care Home Management;Aquatic Therapy;Electrical Stimulation;Moist Heat;DME Instruction;Gait training;Stair training;Functional mobility training;Therapeutic activities;Therapeutic exercise;Balance training;Neuromuscular re-education;Patient/family education;Orthotic Fit/Training;Dry needling;Manual techniques;Canalith Repostioning;Passive range of motion;Biofeedback    PT Next Visit Plan L UE/LE strengthening/ dynamic balance tasks.    PT Home Exercise Plan HEP: standing hip marches/abd, ankle DF/PF in sitting/supine, passive L elbow stretch for tone    Consulted and Agree with Plan of Care Patient    Family Member Consulted wife             Patient will benefit from skilled therapeutic intervention in order to improve the following deficits and impairments:  Abnormal gait,Decreased coordination,Decreased range of motion,Difficulty walking,Impaired tone,Decreased endurance,Impaired UE functional use,Decreased activity tolerance,Impaired perceived functional ability,Decreased balance,Impaired flexibility,Decreased cognition,Decreased mobility,Decreased strength,Increased edema,Postural dysfunction,Pain  Visit Diagnosis: Hemiplegia and hemiparesis following cerebral infarction  affecting left non-dominant side (HCC)  Other abnormalities of gait and mobility  Difficulty in walking, not elsewhere classified  Other lack of coordination  At risk for falls  Muscle weakness (generalized)  Pain in left arm     Problem List Patient Active Problem List   Diagnosis Date Noted   Type 2 diabetes mellitus with hyperglycemia ([place]) 50/10/7046   Diastolic dysfunction    DKA (diabetic ketoacidosis) ([place]) 09/20/2020   Abnormal EKG 08/16/2020   CKD (chronic kidney disease) stage 2, GFR 60-89 ml/min 06/27/2020   Monitoring for anticoagulant use 06/27/2020   Lower extremity edema 05/16/2020   Paroxysmal atrial fibrillation ([place]) 10/26/2019   Coagulopathy ([place]) 06/12/2019   Gastroesophageal reflux disease    Hemiplegia and hemiparesis following cerebral infarction affecting left non-dominant side ([place]) 11/25/2018   RBBB 10/15/2018   Dysphagia, post-stroke    Microalbuminuria 07/29/2018   Hyperlipidemia LDL goal <70 02/11/2017   Recurrent strokes ([place]) 11/10/2016   Essential hypertension 11/10/2016   Tobacco abuse 08/28/2013   Peripheral neuropathy 08/28/2013   Pura Spice, PT, DPT # [phone] 12/13/2020, 3:08 PM  [facility] [address] [place], [place], [address] Phone: [phone]   Fax:  [phone]  Name: [name] MRN: [id number] Date of Birth: 1955-11-24   *Addendum on 02/10/21 to reflect correct authorization visit count in respect to [facility] award letter.  4:10 PM, 02/10/21 Etta Grandchild, PT, DPT Physical Therapist - [facility] in [place]  [phone] (Office)

## 2020-12-10 NOTE — Patient Instructions (Signed)
Visit Information  Evan Townsend was given information about Medicaid Managed Care team care coordination services as a part of their [facility] Medicaid benefit. [name]'s DPR, Riesa Pope, verbally consented to engagement with the [facility] Managed Care team.   For questions related to your Healthy [facility] health plan, please call: [phone] or visit the homepage here: [email or web address]  If you would like to schedule transportation through your Healthy [facility] plan, please call the following number at least 2 days in advance of your appointment: [phone]   Call the [facility] at [phone], at any time, 24 hours a day, 7 days a week. If you are in danger or need immediate medical attention call 911.  Evan Townsend - following are the goals we discussed in your visit today:  Goals Addressed            This Visit's Progress   . Monitor and Manage My Blood Sugar-Diabetes Type 2       Timeframe:  Long-Range Goal Priority:  High Start Date:  12/10/20                           Expected End Date:  02/07/21              Follow Up Date 01/16/21    - check blood sugar at prescribed times - check blood sugar if I feel it is too high or too low - take the blood sugar meter to all doctor visits  - maintain a diabetic diet - take all medications as directed - contact provider with concerns or questions   Why is this important?    Checking your blood sugar at home helps to keep it from getting very high or very low.   Writing the results in a diary or log helps the doctor know how to care for you.   Your blood sugar log should have the time, date and the results.   Also, write down the amount of insulin or other medicine that you take.   Other information, like what you ate, exercise done and how you were feeling, will also be helpful.            Please see education materials related to diabetic  diet provided as print materials.     Diabetes Mellitus and Nutrition, Adult When you have diabetes, or diabetes mellitus, it is very important to have healthy eating habits because your blood sugar (glucose) levels are greatly affected by what you eat and drink. Eating healthy foods in the right amounts, at about the same times every day, can help you:  Control your blood glucose.  Lower your risk of heart disease.  Improve your blood pressure.  Reach or maintain a healthy weight. What can affect my meal plan? Every person with diabetes is different, and each person has different needs for a meal plan. Your health care provider may recommend that you work with a dietitian to make a meal plan that is best for you. Your meal plan may vary depending on factors such as:  The calories you need.  The medicines you take.  Your weight.  Your blood glucose, blood pressure, and cholesterol levels.  Your activity level.  Other health conditions you have, such as heart or kidney disease. How do carbohydrates affect me? Carbohydrates, also called carbs, affect your blood glucose level more than any other type of food. Eating carbs naturally raises  the amount of glucose in your blood. Carb counting is a method for keeping track of how many carbs you eat. Counting carbs is important to keep your blood glucose at a healthy level, especially if you use insulin or take certain oral diabetes medicines. It is important to know how many carbs you can safely have in each meal. This is different for every person. Your dietitian can help you calculate how many carbs you should have at each meal and for each snack. How does alcohol affect me? Alcohol can cause a sudden decrease in blood glucose (hypoglycemia), especially if you use insulin or take certain oral diabetes medicines. Hypoglycemia can be a life-threatening condition. Symptoms of hypoglycemia, such as sleepiness, dizziness, and confusion, are  similar to symptoms of having too much alcohol.  Do not drink alcohol if: ? Your health care provider tells you not to drink. ? You are pregnant, may be pregnant, or are planning to become pregnant.  If you drink alcohol: ? Do not drink on an empty stomach. ? Limit how much you use to:  0-1 drink a day for women.  0-2 drinks a day for men. ? Be aware of how much alcohol is in your drink. In the [place] one drink equals one 12 oz bottle of beer (355 mL), one 5 oz glass of wine (148 mL), or one 1 oz glass of hard liquor (44 mL). ? Keep yourself hydrated with water, diet soda, or unsweetened iced tea.  Keep in mind that regular soda, juice, and other mixers may contain a lot of sugar and must be counted as carbs. What are tips for following this plan? Reading food labels  Start by checking the serving size on the "Nutrition Facts" label of packaged foods and drinks. The amount of calories, carbs, fats, and other nutrients listed on the label is based on one serving of the item. Many items contain more than one serving per package.  Check the total grams (g) of carbs in one serving. You can calculate the number of servings of carbs in one serving by dividing the total carbs by 15. For example, if a food has 30 g of total carbs per serving, it would be equal to 2 servings of carbs.  Check the number of grams (g) of saturated fats and trans fats in one serving. Choose foods that have a low amount or none of these fats.  Check the number of milligrams (mg) of salt (sodium) in one serving. Most people should limit total sodium intake to less than 2,300 mg per day.  Always check the nutrition information of foods labeled as "low-fat" or "nonfat." These foods may be higher in added sugar or refined carbs and should be avoided.  Talk to your dietitian to identify your daily goals for nutrients listed on the label. Shopping  Avoid buying canned, pre-made, or processed foods. These foods tend to  be high in fat, sodium, and added sugar.  Shop around the outside edge of the grocery store. This is where you will most often find fresh fruits and vegetables, bulk grains, fresh meats, and fresh dairy. Cooking  Use low-heat cooking methods, such as baking, instead of high-heat cooking methods like deep frying.  Cook using healthy oils, such as olive, canola, or sunflower oil.  Avoid cooking with butter, cream, or high-fat meats. Meal planning  Eat meals and snacks regularly, preferably at the same times every day. Avoid going long periods of time without eating.  Eat foods  that are high in fiber, such as fresh fruits, vegetables, beans, and whole grains. Talk with your dietitian about how many servings of carbs you can eat at each meal.  Eat 4-6 oz (112-168 g) of lean protein each day, such as lean meat, chicken, fish, eggs, or tofu. One ounce (oz) of lean protein is equal to: ? 1 oz (28 g) of meat, chicken, or fish. ? 1 egg. ?  cup (62 g) of tofu.  Eat some foods each day that contain healthy fats, such as avocado, nuts, seeds, and fish.   What foods should I eat? Fruits Berries. Apples. Oranges. Peaches. Apricots. Plums. Grapes. Mango. Papaya. Pomegranate. Kiwi. Cherries. Vegetables Lettuce. Spinach. Leafy greens, including kale, chard, collard greens, and mustard greens. Beets. Cauliflower. Cabbage. Broccoli. Carrots. Green beans. Tomatoes. Peppers. Onions. Cucumbers. Brussels sprouts. Grains Whole grains, such as whole-wheat or whole-grain bread, crackers, tortillas, cereal, and pasta. Unsweetened oatmeal. Quinoa. Brown or wild rice. Meats and other proteins Seafood. Poultry without skin. Lean cuts of poultry and beef. Tofu. Nuts. Seeds. Dairy Low-fat or fat-free dairy products such as milk, yogurt, and cheese. The items listed above may not be a complete list of foods and beverages you can eat. Contact a dietitian for more information. What foods should I  avoid? Fruits Fruits canned with syrup. Vegetables Canned vegetables. Frozen vegetables with butter or cream sauce. Grains Refined white flour and flour products such as bread, pasta, snack foods, and cereals. Avoid all processed foods. Meats and other proteins Fatty cuts of meat. Poultry with skin. Breaded or fried meats. Processed meat. Avoid saturated fats. Dairy Full-fat yogurt, cheese, or milk. Beverages Sweetened drinks, such as soda or iced tea. The items listed above may not be a complete list of foods and beverages you should avoid. Contact a dietitian for more information. Questions to ask a health care provider  Do I need to meet with a diabetes educator?  Do I need to meet with a dietitian?  What number can I call if I have questions?  When are the best times to check my blood glucose? Where to find more information:  American Diabetes Association: diabetes.org  Academy of Nutrition and Dietetics: www.eatright.[employer] of Diabetes and Digestive and Kidney Diseases: [employer].dk  Association of Diabetes Care and Education Specialists: www.diabeteseducator.org Summary  It is important to have healthy eating habits because your blood sugar (glucose) levels are greatly affected by what you eat and drink.  A healthy meal plan will help you control your blood glucose and maintain a healthy lifestyle.  Your health care provider may recommend that you work with a dietitian to make a meal plan that is best for you.  Keep in mind that carbohydrates (carbs) and alcohol have immediate effects on your blood glucose levels. It is important to count carbs and to use alcohol carefully. This information is not intended to replace advice given to you by your health care provider. Make sure you discuss any questions you have with your health care provider. Document Revised: 07/11/2019 Document Reviewed: 07/11/2019 Elsevier Patient Education  2021 [employer].   The patient verbalized understanding of instructions provided today and agreed to receive a mailed copy of patient instruction and/or educational materials.  Telephone follow up appointment with Managed Medicaid care management team member scheduled for:01/16/21 @ 3:30pm  Lurena Joiner RN, BSN [place] RN Care Coordinator   Following is a copy of your plan of care:  Patient Care  Plan: Diabetes Type 2 (Adult)    Problem Identified: Glycemic Management (Diabetes, Type 2)     Long-Range Goal: Glycemic Management Optimized   Start Date: 12/10/2020  Expected End Date: 02/07/2021  This Visit's Progress: On track  Priority: High  Note:   Objective:  Lab Results  Component Value Date   HGBA1C 8.4 (H) 09/20/2020 .   Lab Results  Component Value Date   CREATININE 1.18 10/03/2020   CREATININE 0.89 09/21/2020   CREATININE 1.05 09/21/2020 .   Marland Kitchen No results found for: EGFR Current Barriers:  Marland Kitchen Knowledge Deficits related to basic Diabetes pathophysiology and self care/management-Mr. Joyce manages his diabetes with the help of his wife, Evan Townsend. He has muscle weakness after cerebral infarction. He recently started using a CGM. Evan Townsend makes sure that he has all of his medications, prepares all of his meals and helps him with dressing and bathing. He had a recent hospitalization for DKA. Average BS readings are around 130. Evan Townsend would like a shower chair and bedside commode to assist in Evan Townsend's care. . Unable to perform ADLs independently . Unable to perform IADLs independently Case Manager Clinical Goal(s):  . patient will demonstrate improved adherence to prescribed treatment plan for diabetes self care/management as evidenced by:  . daily monitoring and recording of CBG   . adherence to ADA/ carb modified diet  . adherence to prescribed medication regimen  . contacting provider for new or worsened symptoms or questions Interventions:   . Inter-disciplinary care team collaboration (see longitudinal plan of care) . Reviewed medications with patient and discussed importance of medication adherence . Discussed plans with patient for ongoing care management follow up and provided patient with direct contact information for care management team . Provided patient with written educational materials related to hypo and hyperglycemia and importance of correct treatment, diabetic diet . Reviewed scheduled/upcoming provider appointments  . Referral made to pharmacy team for assistance with medication management . Review of patient status, including review of consultants reports, relevant laboratory and other test results, and medications completed. Nash Dimmer with PCP for bedside commode and shower chair  Self-Care Activities - Attends all scheduled provider appointments - Checks blood sugars as prescribed and utilize hyper and hypoglycemia protocol as needed - Adheres to prescribed ADA/carb modified Patient Goals:  Follow Up Plan: Telephone follow up appointment with care management team member scheduled for:01/16/21 @ 3:30pm

## 2020-12-16 ENCOUNTER — Ambulatory Visit: Payer: Medicaid Other | Admitting: Physical Therapy

## 2020-12-17 ENCOUNTER — Encounter: Payer: Medicaid Other | Admitting: Physical Therapy

## 2020-12-17 ENCOUNTER — Other Ambulatory Visit: Payer: Self-pay | Admitting: Family Medicine

## 2020-12-19 ENCOUNTER — Telehealth: Payer: Self-pay | Admitting: Family Medicine

## 2020-12-19 NOTE — Telephone Encounter (Signed)
..   Medicaid Managed Care   Unsuccessful Outreach Note  12/19/2020 Name: CRISTOFER YAFFE Sr. MRN: [id number] DOB: [birth year]  Referred by: Alba Cory, MD Reason for referral : No chief complaint on file.   An unsuccessful telephone outreach was attempted today. The patient was referred to the case management team for assistance with care management and care coordination. Attempting to schedule a phone visit with the MM pharmacist.  Follow Up Plan: The care management team will reach out to the patient again over the next 7 days.  Weston Settle Managed [facility] Care Guide

## 2020-12-23 ENCOUNTER — Telehealth: Payer: Self-pay | Admitting: Family Medicine

## 2020-12-23 ENCOUNTER — Telehealth: Payer: Self-pay

## 2020-12-23 ENCOUNTER — Encounter: Payer: Self-pay | Admitting: Physical Therapy

## 2020-12-23 ENCOUNTER — Other Ambulatory Visit: Payer: Self-pay

## 2020-12-23 ENCOUNTER — Ambulatory Visit: Payer: Medicaid Other | Attending: Internal Medicine | Admitting: Physical Therapy

## 2020-12-23 DIAGNOSIS — M79602 Pain in left arm: Secondary | ICD-10-CM | POA: Insufficient documentation

## 2020-12-23 DIAGNOSIS — R278 Other lack of coordination: Secondary | ICD-10-CM | POA: Diagnosis present

## 2020-12-23 DIAGNOSIS — I69354 Hemiplegia and hemiparesis following cerebral infarction affecting left non-dominant side: Secondary | ICD-10-CM | POA: Insufficient documentation

## 2020-12-23 DIAGNOSIS — M6281 Muscle weakness (generalized): Secondary | ICD-10-CM | POA: Diagnosis present

## 2020-12-23 DIAGNOSIS — Z9181 History of falling: Secondary | ICD-10-CM | POA: Insufficient documentation

## 2020-12-23 DIAGNOSIS — R262 Difficulty in walking, not elsewhere classified: Secondary | ICD-10-CM | POA: Insufficient documentation

## 2020-12-23 DIAGNOSIS — R2689 Other abnormalities of gait and mobility: Secondary | ICD-10-CM | POA: Diagnosis present

## 2020-12-23 NOTE — Progress Notes (Signed)
Name: Evan SWINGER Sr.   MRN: [id number]    DOB: 1955/11/26   Date:12/24/2020       Progress Note  Subjective  Chief Complaint  Follow Up  HPI  He came in with his wife.  DMII: with gastroparesis, dyslipidemia, neuropathy. A1C has improved. Compliant with Farxiga 10 mg and Levemir 35 units , he check glucose with Free Style libre, average past 7 days was 136, 14 days 123 , 30 days 123 and 90 days 123 . No recent hypoglycemic episodes. Denies polyphagia, polydipsia or polyuria. He is taking medications as prescribed, takes gabapentin for neuropathy, metoclopramide for gastroparesis and atorvastatin for dyslipidemia  Paroxysmal Afib/HTN: taking medication, rate controlled, under the care of Dr. Saunders Revel and denies side effects  GERD: he has been off PPI and has noticed increase in heartburn and indigestion, we will resume omeprazole   Recurrent Strokes: had twice, diagnosed with coagulopathy, he is on eliquis, having PT , using a cane but able to walk without a Advertising account planner. He still has dysarthria and dysphagia. He is happy that he can walk, but still feeling depressed due to the inability to do things he used to independently.     Patient Active Problem List   Diagnosis Date Noted  . Type 2 diabetes mellitus with hyperglycemia ([place]) 10/29/2020  . Diastolic dysfunction   . History of diabetes with ketoacidosis 09/20/2020  . CKD (chronic kidney disease) stage 2, GFR 60-89 ml/min 06/27/2020  . Monitoring for anticoagulant use 06/27/2020  . Lower extremity edema 05/16/2020  . Paroxysmal atrial fibrillation ([place]) 10/26/2019  . Coagulopathy ([place]) 06/12/2019  . Gastroesophageal reflux disease   . Hemiplegia and hemiparesis following cerebral infarction affecting left non-dominant side ([place]) 11/25/2018  . RBBB 10/15/2018  . Dysphagia, post-stroke   . Microalbuminuria 07/29/2018  . Hyperlipidemia LDL goal <70 02/11/2017  . Recurrent strokes ([place]) 11/10/2016  . Essential hypertension  11/10/2016  . Tobacco abuse 08/28/2013  . Peripheral neuropathy 08/28/2013    Past Surgical History:  Procedure Laterality Date  . ESOPHAGOGASTRODUODENOSCOPY (EGD) WITH PROPOFOL N/A 04/26/2019   Procedure: ESOPHAGOGASTRODUODENOSCOPY (EGD) WITH PROPOFOL;  Surgeon: Lin Landsman, MD;  Location: [facility];  Service: Gastroenterology;  Laterality: N/A;    Family History  Problem Relation Age of Onset  . Hypertension Mother   . Stroke Mother        died @ age 81  . Hypertension Father   . Diabetes Father   . Heart attack Father        died @ 1    Social History   Tobacco Use  . Smoking status: Former Smoker    Packs/day: 1.00    Years: 38.00    Pack years: 38.00    Types: Cigarettes    Start date: 1982  . Smokeless tobacco: Never Used  Substance Use Topics  . Alcohol use: Not Currently    Alcohol/week: 1.0 standard drink    Types: 1 Cans of beer per week    Comment: previously drank but nothing in 1-2 yrs (08/2019).     Current Outpatient Medications:  .  ACCU-CHEK GUIDE test strip, , Disp: , Rfl:  .  Accu-Chek Softclix Lancets lancets, SMARTSIG:Topical, Disp: , Rfl:  .  apixaban (ELIQUIS) 5 MG TABS tablet, Take 1 tablet (5 mg total) by mouth 2 (two) times daily., Disp: 180 tablet, Rfl: 0 .  atorvastatin (LIPITOR) 80 MG tablet, Take 1 tablet (80 mg total) by mouth daily., Disp: 90 tablet, Rfl: 1 .  Blood Glucose Monitoring Suppl (FIFTY50 GLUCOSE METER 2.0) w/Device KIT, Use as instructed, Disp: , Rfl:  .  Blood Glucose Monitoring Suppl KIT, Accucheck Guide with lancets and test strips #100 with one refill.  Test blood sugar twice daily. DX Code E11.65 ; Z79.4, Disp: 1 kit, Rfl: 0 .  Continuous Blood Gluc Receiver (FREESTYLE LIBRE 2 READER) DEVI, 1 each by Does not apply route daily., Disp: 1 each, Rfl: 0 .  Continuous Blood Gluc Sensor (FREESTYLE LIBRE 2 SENSOR) MISC, 1 each by Does not apply route every 14 (fourteen) days., Disp: 6 each, Rfl: 1 .  dapagliflozin  propanediol (FARXIGA) 10 MG TABS tablet, Take 1 tablet (10 mg total) by mouth daily before breakfast. In place of glipizide, Disp: 90 tablet, Rfl: 1 .  DULoxetine (CYMBALTA) 30 MG capsule, Take 1 capsule (30 mg total) by mouth daily., Disp: 30 capsule, Rfl: 0 .  gabapentin (NEURONTIN) 300 MG capsule, TAKE TWO CAPSULES BY MOUTH TWICE A DAY, Disp: 360 capsule, Rfl: 0 .  Glucagon (GVOKE HYPOPEN 1-PACK) 1 MG/0.2ML SOAJ, Inject 1 each into the skin daily as needed., Disp: 0.2 mL, Rfl: 2 .  hydrochlorothiazide (HYDRODIURIL) 25 MG tablet, Take 1 tablet (25 mg total) by mouth daily., Disp: 90 tablet, Rfl: 0 .  Insulin Pen Needle 32G X 6 MM MISC, Daily, Disp: 100 each, Rfl: 3 .  LEVEMIR FLEXTOUCH 100 UNIT/ML FlexPen, INJECT 35 UNITS INTO THE SKIN DAILY, Disp: 15 mL, Rfl: 0 .  lisinopril (ZESTRIL) 40 MG tablet, Take 1 tablet (40 mg total) by mouth daily., Disp: 90 tablet, Rfl: 3 .  Misc. Devices MISC, One pair of Compression stockings  Hemiplegia and hemiparesis following cerebral infarction affecting left non-dominant side (HCC) - Primary Codes: H21.975 Lower extremity edema  Codes: R60.0 Type 2 diabetes mellitus with hyperglycemia, with long-term current use of insulin (HCC)  Codes: E11.65, Z79.4, Disp: 1 Units, Rfl: 0 .  baclofen (LIORESAL) 10 MG tablet, Take 1 tablet (10 mg total) by mouth 3 (three) times daily., Disp: 90 each, Rfl: 1 .  metoCLOPramide (REGLAN) 5 MG tablet, Take 1 tablet (5 mg total) by mouth 3 (three) times daily before meals., Disp: 90 tablet, Rfl: 2 .  omeprazole (PRILOSEC) 40 MG capsule, Take 1 capsule (40 mg total) by mouth in the morning and at bedtime., Disp: 90 capsule, Rfl: 1  No Known Allergies  I personally reviewed active problem list, medication list, allergies, family history, social history, health maintenance with the patient/caregiver today.   ROS  Constitutional: Negative for fever or weight change.  Respiratory: Negative for cough and shortness of breath.    Cardiovascular: Negative for chest pain or palpitations.  Gastrointestinal: Negative for abdominal pain, no bowel changes.  Musculoskeletal: Positive  for gait problem but no  joint swelling.  Skin: Negative for rash.  Neurological: Negative for dizziness or headache.  No other specific complaints in a complete review of systems (except as listed in HPI above).  Objective  Vitals:   12/24/20 1343  BP: 128/78  Pulse: 84  Resp: 18  Temp: 97.9 F (36.6 C)  SpO2: 99%  Weight: 165 lb 1.6 oz (74.9 kg)  Height: _0  (1.676 m)    Body mass index is 26.65 kg/m.  Physical Exam  Constitutional: Patient appears well-developed and well-nourished.  No distress.  HEENT: head atraumatic, normocephalic, pupils equal and reactive to light,  neck supple Cardiovascular: Normal rate, regular rhythm and normal heart sounds.  No murmur heard. No BLE edema. Pulmonary/Chest:  Effort normal and breath sounds normal. No respiratory distress. Abdominal: Soft.  There is no tenderness. Muscular Skeletal/neuro: hemiparesis left arm and leg, uses a cane to ambulate. Dysarthria  Psychiatric: Patient has a normal mood and affect. behavior is normal. Judgment and thought content normal.  Recent Results (from the past 2160 hour(s))  Basic metabolic panel     Status: Abnormal   Collection Time: 10/03/20 11:42 AM  Result Value Ref Range   Glucose 153 (H) 65 - 99 mg/dL   BUN 30 (H) 8 - 27 mg/dL   Creatinine, Ser 1.18 0.76 - 1.27 mg/dL   GFR calc non Af Amer 65 >59 mL/min/1.73   GFR calc Af Amer 75 >59 mL/min/1.73    Comment: **In accordance with recommendations from the NKF-ASN Task force,**   [facility] is in the process of updating its eGFR calculation to the   2021 CKD-EPI creatinine equation that estimates kidney function   without a race variable.    BUN/Creatinine Ratio 25 (H) 10 - 24   Sodium 145 (H) 134 - 144 mmol/L   Potassium 4.5 3.5 - 5.2 mmol/L   Chloride 109 (H) 96 - 106 mmol/L   CO2 21 20 -  29 mmol/L   Calcium 9.5 8.6 - 10.2 mg/dL  CBC w/Diff     Status: Abnormal   Collection Time: 10/03/20 11:42 AM  Result Value Ref Range   WBC 7.2 3.4 - 10.8 x10E3/uL   RBC 3.91 (L) 4.14 - 5.80 x10E6/uL   Hemoglobin 11.5 (L) 13.0 - 17.7 g/dL   Hematocrit 34.0 (L) 37.5 - 51.0 %   MCV 87 79 - 97 fL   MCH 29.4 26.6 - 33.0 pg   MCHC 33.8 31.5 - 35.7 g/dL   RDW 14.8 11.6 - 15.4 %   Platelets 201 150 - 450 x10E3/uL   Neutrophils 81 Not Estab. %   Lymphs 14 Not Estab. %   Monocytes 4 Not Estab. %   Eos 1 Not Estab. %   Basos 0 Not Estab. %   Neutrophils Absolute 5.7 1.4 - 7.0 x10E3/uL   Lymphocytes Absolute 1.0 0.7 - 3.1 x10E3/uL   Monocytes Absolute 0.3 0.1 - 0.9 x10E3/uL   EOS (ABSOLUTE) 0.1 0.0 - 0.4 x10E3/uL   Basophils Absolute 0.0 0.0 - 0.2 x10E3/uL   Immature Granulocytes 0 Not Estab. %   Immature Grans (Abs) 0.0 0.0 - 0.1 x10E3/uL  POCT HgB A1C     Status: Abnormal   Collection Time: 12/24/20  1:53 PM  Result Value Ref Range   Hemoglobin A1C 7.4 (A) 4.0 - 5.6 %   HbA1c POC (<> result, manual entry)     HbA1c, POC (prediabetic range)     HbA1c, POC (controlled diabetic range)       PHQ2/9: Depression screen [facility] 2/9 12/24/2020 10/28/2020 09/26/2020 08/27/2020 06/27/2020  Decreased Interest 3 0 0 0 0  Down, Depressed, Hopeless 3 0 0 0 0  PHQ - 2 Score 6 0 0 0 0  Altered sleeping 3 - - - -  Tired, decreased energy 1 - - - -  Change in appetite 1 - - - -  Feeling bad or failure about yourself  3 - - - -  Trouble concentrating 3 - - - -  Moving slowly or fidgety/restless 3 - - - -  Suicidal thoughts 1 - - - -  PHQ-9 Score 21 - - - -  Difficult doing work/chores Somewhat difficult - - - -  Some recent data  might be hidden    phq 9 is positive   Fall Risk: Fall Risk  12/24/2020 10/28/2020 09/26/2020 08/27/2020 06/27/2020  Falls in the past year? 0 0 0 0 1  Number falls in past yr: 0 0 0 0 1  Injury with Fall? 0 0 0 0 0  Follow up - - - - -     Functional Status  Survey: Is the patient deaf or have difficulty hearing?: Yes Does the patient have difficulty seeing, even when wearing glasses/contacts?: Yes Does the patient have difficulty concentrating, remembering, or making decisions?: No Does the patient have difficulty walking or climbing stairs?: Yes Does the patient have difficulty dressing or bathing?: Yes Does the patient have difficulty doing errands alone such as visiting a doctor's office or shopping?: No    Assessment & Plan  1. Type 2 diabetes mellitus with hyperglycemia, unspecified whether long term insulin use ([place])   2. Diabetes mellitus with gastroparesis ([place])  - POCT HgB A1C - metoCLOPramide (REGLAN) 5 MG tablet; Take 1 tablet (5 mg total) by mouth 3 (three) times daily before meals.  Dispense: 90 tablet; Refill: 2  3. Type 2 diabetes mellitus with hyperglycemia, with long-term current use of insulin ([place])   4. Hemiplegia and hemiparesis following cerebral infarction affecting left non-dominant side ([place])   5. Recurrent strokes (HCC)   6. Paroxysmal atrial fibrillation ([place])   7. Gastroesophageal reflux disease without esophagitis  - omeprazole (PRILOSEC) 40 MG capsule; Take 1 capsule (40 mg total) by mouth in the morning and at bedtime.  Dispense: 90 capsule; Refill: 1  8. Neuropathic pain   9. Dysarthria   10. Muscle spasm of left lower extremity  - baclofen (LIORESAL) 10 MG tablet; Take 1 tablet (10 mg total) by mouth 3 (three) times daily.  Dispense: 90 each; Refill: 1  11. Moderate major depression (HCC)  - DULoxetine (CYMBALTA) 30 MG capsule; Take 1 capsule (30 mg total) by mouth daily.  Dispense: 30 capsule; Refill: 0

## 2020-12-23 NOTE — Telephone Encounter (Signed)
Medication Refill - Medication: Test stips and lancets  Has the patient contacted their pharmacy? No. (Agent: If no, request that the patient contact the pharmacy for the refill.) (Agent: If yes, when and what did the pharmacy advise?)  Preferred Pharmacy (with phone number or street name): [facility]  Agent: Please be advised that RX refills may take up to 3 business days. We ask that you follow-up with your pharmacy.

## 2020-12-23 NOTE — Therapy (Signed)
[facility] [address]. [place], [place], [address] Phone: [phone]   Fax:  [phone]  Physical Therapy Treatment  Patient Details  Name: Evan BRECHTEL Sr. MRN: [id number] Date of Birth: 04-Oct-1955 Referring Provider (PT): Lebron Conners MD   Encounter Date: 12/23/2020   PT End of Session - 12/28/20 1144    Visit Number 16    Number of Visits 24    Date for PT Re-Evaluation 02/17/21    Authorization - Visit Number 6    Authorization - Number of Visits 10    PT Start Time 4259    PT Stop Time 1515    PT Time Calculation (min) 63 min    Equipment Utilized During Treatment Gait belt    Activity Tolerance Patient tolerated treatment well    Behavior During Therapy WFL for tasks assessed/performed           Past Medical History:  Diagnosis Date  . Carotid arterial disease ([place])    a. 08/2018 Carotid U/S: min-mod RICA atherosclerosis w/o hemodynamically significant stenosis. Nl LICA.  . Diabetes 1.5, managed as type 2 ([place])   . Diastolic dysfunction    a. 08/2018 Echo: EF 65%. No rwma. Gr1 DD. Mild MR.  . Diastolic dysfunction    a. 08/2019 Echo: EF 55-60%, Gr1 DD. No rwma. Mild MR. RVSP 37.423mHg.  .Marland KitchenHypercholesterolemia   . Hypertension   . PAF (paroxysmal atrial fibrillation) ([place]    a. 10/2019 Event Monitor: PAF; b. CHA2DS2VASc = 5-->Eliquis.  .Marland KitchenPoorly controlled diabetes mellitus ([place]    a. 04/2019 A1c 13.8.  .Mar[place] KitchenRecurrent strokes ([place]    a. 10/2016 MRI/A: Acute 53mR thalamic infarct, ? subacute infarct of R corona radiata; b. 08/2017 MRI/A: Acute 23m59materal L thalamic infarct. Other more remote lacunar infarcts of thalami bilat. Small vessel dzs; c. 08/2018 MRI/A: Acute lacunar infarct of the post limb of R internal capsule; d. 08/2019 MRI Acute CVA of L paramedian pons adn R cerebellar hemisphere.  . Tobacco abuse     Past Surgical History:  Procedure Laterality Date  . ESOPHAGOGASTRODUODENOSCOPY (EGD) WITH  PROPOFOL N/A 04/26/2019   Procedure: ESOPHAGOGASTRODUODENOSCOPY (EGD) WITH PROPOFOL;  Surgeon: VanLin LandsmanD;  [facility];  Laterality: N/A;    There were no vitals filed for this visit.       [facility] - 12/28/20 0001      Assessment   Medical Diagnosis Hemiplegia and hemiparesis    Referring Provider (PT) HenLebron Conners    Onset Date/Surgical Date 08/18/19    Hand Dominance Right      Precautions   Precautions FalCentresidence      Prior Function   Level of Independence Requires assistive device for independence            Pt. entered PT with no new complaints. Pt. missed PT last week due to transportation issues. Pt. staying active at home and trying to walk more.        There.ex.:  Nustep L4 10 min. B UE/LE  Walking in //-bars with hip flexion/ step pattern with R UE assist to no UE assist in //-bars 5x.  Standing 4-way hip (no resistance)- 20x each.  Cuing for posture correction.   Supine L shoulder A/AROM and L elbow stretches (MH to L shoulder/ elbow)- 4# L wrist wt./ static holds.  Neuro:  Walking in //-bars with Airex step ups/ overs 2x with R UE only.  Standing B shoulder AAROM with wand (added Airex standing for balance).  Walking in PT clinic (forward/ lateral)- no UE assist and cuing for posture correction.   Sit to stands with focus on proper technique/ posture/ L LE wt. Bearing.   Amb. In clinic without loftstrand working on maintaining proper BOS/ step pattern     PT Long Term Goals - 12/28/20 1156      PT LONG TERM GOAL #1   Title Pt will increase Berg Balance to 48/56 to demonstrate clinically significant improvement in balance decreasing pt's risk for falls during functional activities.    Baseline 2/3: 42/56    Time 8    Period Weeks    Status Not Met    Target Date 02/17/21      PT LONG TERM GOAL #2    Title Pt will improve 10 m gait speed to > 0.5 m/s  with LRAD to improve safe community ambulation.    Baseline 2/3: 0.32 m/s with SPC and SBA/CGA    Time 8    Period Weeks    Status Not Met    Target Date 02/17/21      PT LONG TERM GOAL #3   Title Pt will improve 5xSTS to < 12 sec with no RUE support to demonstrate clinically significant improvement in BLE strength to improve STS t/f's like standing from the toilet.    Baseline 2/3: 13.5 sec.    Time 8    Period Weeks    Status Not Met    Target Date 02/17/21      PT LONG TERM GOAL #4   Title Pt will improve DGI by a minimum of 5 points to demonstrate clinically significant improvement and safety with community ambulation tasks to decrease risk of falls.    Baseline TBD    Time 8    Period Weeks    Status Not Met    Target Date 02/17/21                 Plan - 12/28/20 1149    Clinical Impression Statement Pt. demonstrates progression to ambulating with SBA in clinic without use of loftstrand crutch.  Pt. had 1 episode of limited hip flexion/ step pattern resulting in a narrow BOS causing a LOB.  Pt. was able to self--correct but PT recommends SBA/CGA for safety.  PT reviewed L shoulder/ elbow HEP and will continue to work on generalized strengthening/ independence with functional mobility.  Pt. very motivated and hard working during tx. session.    Personal Factors and Comorbidities Age;Comorbidity 3+;Education;Finances;Past/Current Experience;Fitness;Transportation    Comorbidities HTN, uncontrolled DM, HLD, R basal ganglia stroke on 08/2018 and L pointine and R cerebellar stroke 09/07/19, sinus bradycardia, peripheral neuropathy    Examination-Activity Limitations Bathing;Bed Mobility;Dressing;Transfers;Squat;Lift;Locomotion Level;Stairs;Reach Overhead;Carry;Stand;Sit    Examination-Participation Restrictions Community Activity;Interpersonal Relationship;Yard Work    Merchant navy officer Evolving/Moderate  complexity    Clinical Decision Making Moderate    Rehab Potential Fair    PT Frequency 1x / week    PT Duration 8 weeks    PT Treatment/Interventions ADLs/Self Care Home Management;Aquatic Therapy;Electrical Stimulation;Moist Heat;DME Instruction;Gait training;Stair training;Functional mobility training;Therapeutic activities;Therapeutic exercise;Balance training;Neuromuscular re-education;Patient/family education;Orthotic Fit/Training;Dry needling;Manual techniques;Canalith Repostioning;Passive range of motion;Biofeedback    PT Next Visit Plan L UE/LE strengthening/ dynamic balance tasks.    PT Home Exercise Plan HEP: standing hip marches/abd, ankle DF/PF in sitting/supine, passive L elbow stretch  for tone    Consulted and Agree with Plan of Care Patient    Family Member Consulted wife           Patient will benefit from skilled therapeutic intervention in order to improve the following deficits and impairments:  Abnormal gait,Decreased coordination,Decreased range of motion,Difficulty walking,Impaired tone,Decreased endurance,Impaired UE functional use,Decreased activity tolerance,Impaired perceived functional ability,Decreased balance,Impaired flexibility,Decreased cognition,Decreased mobility,Decreased strength,Increased edema,Postural dysfunction,Pain  Visit Diagnosis: Hemiplegia and hemiparesis following cerebral infarction affecting left non-dominant side (HCC)  Other abnormalities of gait and mobility  Difficulty in walking, not elsewhere classified  Other lack of coordination  At risk for falls  Muscle weakness (generalized)  Pain in left arm     Problem List Patient Active Problem List   Diagnosis Date Noted  . Type 2 diabetes mellitus with hyperglycemia ([place]) 10/29/2020  . Diastolic dysfunction   . History of diabetes with ketoacidosis 09/20/2020  . CKD (chronic kidney disease) stage 2, GFR 60-89 ml/min 06/27/2020  . Monitoring for anticoagulant use 06/27/2020   . Lower extremity edema 05/16/2020  . Paroxysmal atrial fibrillation ([place]) 10/26/2019  . Coagulopathy ([place]) 06/12/2019  . Gastroesophageal reflux disease   . Hemiplegia and hemiparesis following cerebral infarction affecting left non-dominant side ([place]) 11/25/2018  . RBBB 10/15/2018  . Dysphagia, post-stroke   . Microalbuminuria 07/29/2018  . Hyperlipidemia LDL goal <70 02/11/2017  . Recurrent strokes ([place]) 11/10/2016  . Essential hypertension 11/10/2016  . Tobacco abuse 08/28/2013  . Peripheral neuropathy 08/28/2013   Pura Spice, PT, DPT # [phone] 12/28/2020, 11:59 AM  [facility] [address] [place], [place], [address] Phone: [phone]   Fax:  [phone]  Name: NAOKI MIGLIACCIO Sr. MRN: [id number] Date of Birth: 21-Feb-1956

## 2020-12-23 NOTE — Telephone Encounter (Signed)
Last seen: 3.14.2022 by Dr Linwood Dibbles Next sch'd: 5.10.2022 by Dr Carlynn Purl

## 2020-12-24 ENCOUNTER — Encounter: Payer: Self-pay | Admitting: Family Medicine

## 2020-12-24 ENCOUNTER — Ambulatory Visit: Payer: Medicaid Other | Admitting: Family Medicine

## 2020-12-24 VITALS — BP 128/78 | HR 84 | Temp 97.9°F | Resp 18 | Ht 66.0 in | Wt 165.1 lb

## 2020-12-24 DIAGNOSIS — I639 Cerebral infarction, unspecified: Secondary | ICD-10-CM

## 2020-12-24 DIAGNOSIS — I69354 Hemiplegia and hemiparesis following cerebral infarction affecting left non-dominant side: Secondary | ICD-10-CM | POA: Diagnosis not present

## 2020-12-24 DIAGNOSIS — M792 Neuralgia and neuritis, unspecified: Secondary | ICD-10-CM

## 2020-12-24 DIAGNOSIS — I480 Paroxysmal atrial fibrillation: Secondary | ICD-10-CM | POA: Diagnosis not present

## 2020-12-24 DIAGNOSIS — Z794 Long term (current) use of insulin: Secondary | ICD-10-CM | POA: Diagnosis not present

## 2020-12-24 DIAGNOSIS — K219 Gastro-esophageal reflux disease without esophagitis: Secondary | ICD-10-CM

## 2020-12-24 DIAGNOSIS — R471 Dysarthria and anarthria: Secondary | ICD-10-CM

## 2020-12-24 DIAGNOSIS — E1165 Type 2 diabetes mellitus with hyperglycemia: Secondary | ICD-10-CM

## 2020-12-24 DIAGNOSIS — E1143 Type 2 diabetes mellitus with diabetic autonomic (poly)neuropathy: Secondary | ICD-10-CM | POA: Diagnosis not present

## 2020-12-24 DIAGNOSIS — M62838 Other muscle spasm: Secondary | ICD-10-CM | POA: Diagnosis not present

## 2020-12-24 DIAGNOSIS — F321 Major depressive disorder, single episode, moderate: Secondary | ICD-10-CM | POA: Diagnosis not present

## 2020-12-24 LAB — POCT GLYCOSYLATED HEMOGLOBIN (HGB A1C): Hemoglobin A1C: 7.4 % — AB (ref 4.0–5.6)

## 2020-12-24 MED ORDER — OMEPRAZOLE 40 MG PO CPDR: 40.0000 mg | DELAYED_RELEASE_CAPSULE | Freq: Two times a day (BID) | ORAL | 1 refills | Status: DC

## 2020-12-24 MED ORDER — ACCU-CHEK SOFTCLIX LANCETS MISC: 1 refills | Status: DC

## 2020-12-24 MED ORDER — DULOXETINE HCL 30 MG PO CPEP: 30.0000 mg | ORAL_CAPSULE | Freq: Every day | ORAL | 0 refills | Status: DC

## 2020-12-24 MED ORDER — BACLOFEN 10 MG PO TABS: 10.0000 mg | ORAL_TABLET | Freq: Three times a day (TID) | ORAL | 1 refills | Status: DC

## 2020-12-24 MED ORDER — METOCLOPRAMIDE HCL 5 MG PO TABS: 5.0000 mg | ORAL_TABLET | Freq: Three times a day (TID) | ORAL | 2 refills | Status: DC

## 2020-12-24 MED ORDER — ACCU-CHEK GUIDE VI STRP: ORAL_STRIP | 2 refills | Status: DC

## 2020-12-25 ENCOUNTER — Telehealth: Payer: Self-pay

## 2020-12-25 NOTE — Telephone Encounter (Signed)
Copied from CRM [id number]. Topic: Quick Communication - Rx Refill/Question >> Dec 25, 2020 12:20 PM Randol Kern wrote: Pharmacy called reporting that they need office to call and update prescription, pt's insurance will not cover until Rx is updated. Must say "May test max three times a day, pharmacy called to request verbal call in approval. Theyre happy to receive a verbal and update the prescription. A new order is not necessary if someone can just call this in."  ACCU-CHEK GUIDE test strip  Accu-Chek Softclix Lancets lancets   Dawn called   [facility] [address] [place], [place] - [address] [place] [address] Phone: [phone] Fax: [phone]

## 2020-12-25 NOTE — Telephone Encounter (Signed)
Informed pt that the prescriptions have been sent to the pharmacy. Pt stated that she is not able to get the prescription due to the copay. She currently do not have any money. Do we have samples or is Evan Townsend able to help?

## 2020-12-25 NOTE — Telephone Encounter (Signed)
Verbal order given per Dr. Sowles.  ?

## 2020-12-26 ENCOUNTER — Encounter: Payer: Medicaid Other | Admitting: Physical Therapy

## 2020-12-30 ENCOUNTER — Other Ambulatory Visit: Payer: Self-pay

## 2020-12-30 ENCOUNTER — Encounter: Payer: Self-pay | Admitting: Physical Therapy

## 2020-12-30 ENCOUNTER — Ambulatory Visit: Payer: Medicaid Other | Admitting: Physical Therapy

## 2020-12-30 DIAGNOSIS — M79602 Pain in left arm: Secondary | ICD-10-CM

## 2020-12-30 DIAGNOSIS — Z9181 History of falling: Secondary | ICD-10-CM

## 2020-12-30 DIAGNOSIS — M6281 Muscle weakness (generalized): Secondary | ICD-10-CM

## 2020-12-30 DIAGNOSIS — I69354 Hemiplegia and hemiparesis following cerebral infarction affecting left non-dominant side: Secondary | ICD-10-CM | POA: Diagnosis not present

## 2020-12-30 DIAGNOSIS — R262 Difficulty in walking, not elsewhere classified: Secondary | ICD-10-CM

## 2020-12-30 DIAGNOSIS — R2689 Other abnormalities of gait and mobility: Secondary | ICD-10-CM

## 2020-12-30 DIAGNOSIS — R278 Other lack of coordination: Secondary | ICD-10-CM

## 2020-12-30 NOTE — Therapy (Signed)
[facility] [address]. [place], [place], [address] Phone: [phone]   Fax:  [phone]  Physical Therapy Treatment  Patient Details  Name: Evan CASELLI Sr. MRN: [id number] Date of Birth: [birth year] Referring Provider (PT): Lebron Conners MD   Encounter Date: 12/30/2020   PT End of Session - 01/01/21 1007    Visit Number 17    Number of Visits 24    Date for PT Re-Evaluation 02/17/21    Authorization - Visit Number 7    Authorization - Number of Visits 10    PT Start Time 8756    PT Stop Time 1515    PT Time Calculation (min) 55 min    Equipment Utilized During Treatment Gait belt    Activity Tolerance Patient tolerated treatment well    Behavior During Therapy WFL for tasks assessed/performed           Past Medical History:  Diagnosis Date  . Carotid arterial disease ([place])    a. 08/2018 Carotid U/S: min-mod RICA atherosclerosis w/o hemodynamically significant stenosis. Nl LICA.  . Diabetes 1.5, managed as type 2 ([place])   . Diastolic dysfunction    a. 08/2018 Echo: EF 65%. No rwma. Gr1 DD. Mild MR.  . Diastolic dysfunction    a. 08/2019 Echo: EF 55-60%, Gr1 DD. No rwma. Mild MR. RVSP 37.19mHg.  .Marland KitchenHypercholesterolemia   . Hypertension   . PAF (paroxysmal atrial fibrillation) ([place]    a. 10/2019 Event Monitor: PAF; b. CHA2DS2VASc = 5-->Eliquis.  .Marland KitchenPoorly controlled diabetes mellitus ([place]    a. 04/2019 A1c 13.8.  .Marland KitchenRecurrent strokes ([place]    a. 10/2016 MRI/A: Acute 543mR thalamic infarct, ? subacute infarct of R corona radiata; b. 08/2017 MRI/A: Acute 3m86materal L thalamic infarct. Other more remote lacunar infarcts of thalami bilat. Small vessel dzs; c. 08/2018 MRI/A: Acute lacunar infarct of the post limb of R internal capsule; d. 08/2019 MRI Acute CVA of L paramedian pons adn R cerebellar hemisphere.  . Tobacco abuse     Past Surgical History:  Procedure Laterality Date  . ESOPHAGOGASTRODUODENOSCOPY (EGD) WITH  PROPOFOL N/A 04/26/2019   Procedure: ESOPHAGOGASTRODUODENOSCOPY (EGD) WITH PROPOFOL;  Surgeon: VanLin LandsmanD;  [facility];  Laterality: N/A;    There were no vitals filed for this visit.      Subjective Assessment - 12/30/20 1404    Subjective Pt. entered PT with use of L hand/ wrist positioning brace.  No c/o falls or pain this past weekend.    Pertinent History Pertinent history and PMH: HTN, uncontrolled DM, HLD, R basal ganglia stroke on 08/2018 and L pointine and R cerebellar stroke 09/07/19, sinus bradycardia, peripheral neuropat    Patient Stated Goals Improve strengthening/ walking.  Return to fishing.    Currently in Pain? No/denies    Pain Onset More than a month ago             There.ex.:  Nustep L5 10 min. B UE/LE  L LE step ups at stairs with R UE assist 8x.  Cuing to prevent/ control L knee hyperextension.    Walking in //-bars with hip flexion/ step pattern with R UE assist to no UE assist in //-bars 5x.  Standing 4-way hip (no resistance)- 20x each.  Cuing for posture correction.   Supine L shoulder A/AROM and L elbow stretches (MH to L shoulder/ elbow)- PT assist required/ assessment of L hand/wrist brace.  Neuro:  Walking in PT clinic (forward/ lateral)- no UE assist and cuing for posture correction.   Sit to stands with focus on proper technique/ posture/ L LE wt. Bearing.   Amb. In clinic without loftstrand working on maintaining proper BOS/ step pattern      PT Long Term Goals - 12/28/20 1156      PT LONG TERM GOAL #1   Title Pt will increase Berg Balance to 48/56 to demonstrate clinically significant improvement in balance decreasing pt's risk for falls during functional activities.    Baseline 2/3: 42/56    Time 8    Period Weeks    Status Not Met    Target Date 02/17/21      PT LONG TERM GOAL #2   Title Pt will improve 10 m gait speed to > 0.5 m/s  with LRAD to improve safe  community ambulation.    Baseline 2/3: 0.32 m/s with SPC and SBA/CGA    Time 8    Period Weeks    Status Not Met    Target Date 02/17/21      PT LONG TERM GOAL #3   Title Pt will improve 5xSTS to < 12 sec with no RUE support to demonstrate clinically significant improvement in BLE strength to improve STS t/f's like standing from the toilet.    Baseline 2/3: 13.5 sec.    Time 8    Period Weeks    Status Not Met    Target Date 02/17/21      PT LONG TERM GOAL #4   Title Pt will improve DGI by a minimum of 5 points to demonstrate clinically significant improvement and safety with community ambulation tasks to decrease risk of falls.    Baseline TBD    Time 8    Period Weeks    Status Not Met    Target Date 02/17/21              Plan - 01/01/21 1008    Clinical Impression Statement Pt. ambulates around clinic without use of loftstrand crutch during entire tx. session.  PT recommends SBA/CGA during gait due to L LE weakness/ occasional scissoring gait pattern or limited hip flexion/ foot placement.  No LOB during tx. session and pt. works hard during all standing/ dynamic tasks.  L shoulder/ elbow remain limited and pt. benefits from occasional use of L hand/wrist brace to prevent finger/wrist flexion.    Personal Factors and Comorbidities Age;Comorbidity 3+;Education;Finances;Past/Current Experience;Fitness;Transportation    Comorbidities HTN, uncontrolled DM, HLD, R basal ganglia stroke on 08/2018 and L pointine and R cerebellar stroke 09/07/19, sinus bradycardia, peripheral neuropathy    Examination-Activity Limitations Bathing;Bed Mobility;Dressing;Transfers;Squat;Lift;Locomotion Level;Stairs;Reach Overhead;Carry;Stand;Sit    Examination-Participation Restrictions Community Activity;Interpersonal Relationship;Yard Work    [employer]chant [employer] officer Evolving/Moderate complexity    Clinical Decision Making Moderate    Rehab Potential Fair    PT Frequency 1x / week    PT  Duration 8 weeks    PT Treatment/Interventions ADLs/Self Care Home Management;Aquatic Therapy;Electrical Stimulation;Moist Heat;DME Instruction;Gait training;Stair training;Functional mobility training;Therapeutic activities;Therapeutic exercise;Balance training;Neuromuscular re-education;Patient/family education;Orthotic Fit/Training;Dry needling;Manual techniques;Canalith Repostioning;Passive range of motion;Biofeedback    PT Next Visit Plan L UE/LE strengthening/ dynamic balance tasks.    PT Home Exercise Plan HEP: standing hip marches/abd, ankle DF/PF in sitting/supine, passive L elbow stretch for tone    Consulted and Agree with Plan of Care Patient    Family Member Consulted wife           Patient will benefit  from skilled therapeutic intervention in order to improve the following deficits and impairments:  Abnormal gait,Decreased coordination,Decreased range of motion,Difficulty walking,Impaired tone,Decreased endurance,Impaired UE functional use,Decreased activity tolerance,Impaired perceived functional ability,Decreased balance,Impaired flexibility,Decreased cognition,Decreased mobility,Decreased strength,Increased edema,Postural dysfunction,Pain  Visit Diagnosis: Hemiplegia and hemiparesis following cerebral infarction affecting left non-dominant side (HCC)  Other abnormalities of gait and mobility  Difficulty in walking, not elsewhere classified  Other lack of coordination  At risk for falls  Muscle weakness (generalized)  Pain in left arm     Problem List Patient Active Problem List   Diagnosis Date Noted  . Type 2 diabetes mellitus with hyperglycemia ([place]) 10/29/2020  . Diastolic dysfunction   . History of diabetes with ketoacidosis 09/20/2020  . CKD (chronic kidney disease) stage 2, GFR 60-89 ml/min 06/27/2020  . Monitoring for anticoagulant use 06/27/2020  . Lower extremity edema 05/16/2020  . Paroxysmal atrial fibrillation ([place]) 10/26/2019  . Coagulopathy  ([place]) 06/12/2019  . Gastroesophageal reflux disease   . Hemiplegia and hemiparesis following cerebral infarction affecting left non-dominant side ([place]) 11/25/2018  . RBBB 10/15/2018  . Dysphagia, post-stroke   . Microalbuminuria 07/29/2018  . Hyperlipidemia LDL goal <70 02/11/2017  . Recurrent strokes ([place]) 11/10/2016  . Essential hypertension 11/10/2016  . Tobacco abuse 08/28/2013  . Peripheral neuropathy 08/28/2013   Pura Spice, PT, DPT # [phone] 01/01/2021, 10:16 AM  [facility] [address] [place], [place], [address] Phone: [phone]   Fax:  [phone]  Name: Evan RIEMENSCHNEIDER Sr. MRN: [id number] Date of Birth: Jul 19, 1956

## 2020-12-31 ENCOUNTER — Encounter: Payer: Medicaid Other | Admitting: Physical Therapy

## 2021-01-06 ENCOUNTER — Ambulatory Visit: Payer: Medicaid Other | Admitting: Physical Therapy

## 2021-01-07 ENCOUNTER — Encounter: Payer: Medicaid Other | Admitting: Physical Therapy

## 2021-01-15 ENCOUNTER — Ambulatory Visit: Payer: Medicaid Other | Attending: Internal Medicine | Admitting: Physical Therapy

## 2021-01-15 DIAGNOSIS — M6281 Muscle weakness (generalized): Secondary | ICD-10-CM | POA: Insufficient documentation

## 2021-01-15 DIAGNOSIS — R2681 Unsteadiness on feet: Secondary | ICD-10-CM | POA: Insufficient documentation

## 2021-01-15 DIAGNOSIS — R2689 Other abnormalities of gait and mobility: Secondary | ICD-10-CM | POA: Insufficient documentation

## 2021-01-15 DIAGNOSIS — M79602 Pain in left arm: Secondary | ICD-10-CM | POA: Insufficient documentation

## 2021-01-15 DIAGNOSIS — R262 Difficulty in walking, not elsewhere classified: Secondary | ICD-10-CM | POA: Insufficient documentation

## 2021-01-15 DIAGNOSIS — I69354 Hemiplegia and hemiparesis following cerebral infarction affecting left non-dominant side: Secondary | ICD-10-CM | POA: Insufficient documentation

## 2021-01-15 DIAGNOSIS — R278 Other lack of coordination: Secondary | ICD-10-CM | POA: Insufficient documentation

## 2021-01-15 DIAGNOSIS — Z9181 History of falling: Secondary | ICD-10-CM | POA: Insufficient documentation

## 2021-01-16 ENCOUNTER — Other Ambulatory Visit: Payer: Self-pay | Admitting: *Deleted

## 2021-01-16 NOTE — Patient Instructions (Signed)
Visit Information  Mr. Evan POTTINGER Sr.  - as a part of your Medicaid benefit, you are eligible for care management and care coordination services at no cost or copay. I was unable to reach you by phone today but would be happy to help you with your health related needs. Please feel free to call me @ [phone].   A member of the Managed Medicaid care management team will reach out to you again over the next 7-14 days.   Estanislado Emms RN, BSN [place]  [employer]

## 2021-01-16 NOTE — Patient Outreach (Signed)
Care Coordination  01/16/2021  Evan TILLERY Sr. [birth year] [id number]    Medicaid Managed Care   Unsuccessful Outreach Note  01/16/2021 Name: Evan PFLUGER Sr. MRN: [id number] DOB: [birth year]  Referred by: Alba Cory, MD Reason for referral : High Risk Managed Medicaid (Unsuccessful RNCM follow up outreach)   An unsuccessful telephone outreach was attempted today. The patient was referred to the case management team for assistance with care management and care coordination.   Follow Up Plan: The care management team will reach out to the patient again over the next 7-14 days.   Estanislado Emms RN, BSN [place]  [employer]

## 2021-01-20 ENCOUNTER — Other Ambulatory Visit: Payer: Self-pay

## 2021-01-20 ENCOUNTER — Ambulatory Visit: Payer: Medicaid Other | Admitting: Physical Therapy

## 2021-01-20 ENCOUNTER — Encounter: Payer: Self-pay | Admitting: Physical Therapy

## 2021-01-20 DIAGNOSIS — M79602 Pain in left arm: Secondary | ICD-10-CM

## 2021-01-20 DIAGNOSIS — R278 Other lack of coordination: Secondary | ICD-10-CM

## 2021-01-20 DIAGNOSIS — R2681 Unsteadiness on feet: Secondary | ICD-10-CM

## 2021-01-20 DIAGNOSIS — I69354 Hemiplegia and hemiparesis following cerebral infarction affecting left non-dominant side: Secondary | ICD-10-CM

## 2021-01-20 DIAGNOSIS — R262 Difficulty in walking, not elsewhere classified: Secondary | ICD-10-CM

## 2021-01-20 DIAGNOSIS — Z9181 History of falling: Secondary | ICD-10-CM | POA: Diagnosis present

## 2021-01-20 DIAGNOSIS — M6281 Muscle weakness (generalized): Secondary | ICD-10-CM

## 2021-01-20 DIAGNOSIS — R2689 Other abnormalities of gait and mobility: Secondary | ICD-10-CM | POA: Diagnosis present

## 2021-01-20 NOTE — Therapy (Signed)
[facility] [address] [place], [place], [address] Phone: [phone]   Fax:  [phone]  Physical Therapy Treatment  Patient Details  Name: Evan Townsend. MRN: [id number] Date of Birth: 01-12-1956 Referring Provider (PT): Lebron Conners MD   Encounter Date: 01/20/2021   PT End of Session - 01/20/21 1438     Visit Number 18    Number of Visits 24    Date for PT Re-Evaluation 02/17/21    Authorization - Visit Number 8    Authorization - Number of Visits 10    PT Start Time 1223    Equipment Utilized During Treatment Gait belt    Activity Tolerance Patient tolerated treatment well    Behavior During Therapy [facility] for tasks assessed/performed             Past Medical History:  Diagnosis Date   Carotid arterial disease ([place])    a. 08/2018 Carotid U/S: min-mod RICA atherosclerosis w/o hemodynamically significant stenosis. Nl LICA.   Diabetes 1.5, managed as type 2 ([place])    Diastolic dysfunction    a. 08/2018 Echo: EF 65%. No rwma. Gr1 DD. Mild MR.   Diastolic dysfunction    a. 08/2019 Echo: EF 55-60%, Gr1 DD. No rwma. Mild MR. RVSP 37.56mHg.   Hypercholesterolemia    Hypertension    PAF (paroxysmal atrial fibrillation) ([place]    a. 10/2019 Event Monitor: PAF; b. CHA2DS2VASc = 5-->Eliquis.   Poorly controlled diabetes mellitus ([place]    a. 04/2019 A1c 13.8.   Recurrent strokes ([place]    a. 10/2016 MRI/A: Acute 545mR thalamic infarct, ? subacute infarct of R corona radiata; b. 08/2017 MRI/A: Acute 80m31materal L thalamic infarct. Other more remote lacunar infarcts of thalami bilat. Small vessel dzs; c. 08/2018 MRI/A: Acute lacunar infarct of the post limb of R internal capsule; d. 08/2019 MRI Acute CVA of L paramedian pons adn R cerebellar hemisphere.   Tobacco abuse     Past Surgical History:  Procedure Laterality Date   ESOPHAGOGASTRODUODENOSCOPY (EGD) WITH PROPOFOL N/A 04/26/2019   Procedure: ESOPHAGOGASTRODUODENOSCOPY  (EGD) WITH PROPOFOL;  Surgeon: VanLin LandsmanD;  [facility];  Service: Gastroenterology;  Laterality: N/A;    There were no vitals filed for this visit.   Subjective Assessment - 01/20/21 1436     Subjective Pt. reports no new complaints.  Pts. wife reports that pt. hasn't been walking too much over the weekend.    Pertinent History Pertinent history and PMH: HTN, uncontrolled DM, HLD, R basal ganglia stroke on 08/2018 and L pointine and R cerebellar stroke 09/07/19, sinus bradycardia, peripheral neuropat    Patient Stated Goals Improve strengthening/ walking.  Return to fishing.    Currently in Pain? No/denies    Pain Onset More than a month ago                 There.ex.:   Nustep L5 10 min. B UE/LE   L LE step ups at stairs with R UE assist 8x.  Cuing to prevent/ control L knee hyperextension.     Walking in //-bars with hip flexion/ step pattern with R UE assist to no UE assist in //-bars 5x.   Standing 4-way hip (no resistance)- 20x each.  Cuing for posture correction.    Supine L shoulder A/AROM and L elbow stretches (MH to L shoulder/ elbow)- PT assist required     Neuro:   Walking in PT clinic (forward/ lateral)- no  UE assist and cuing for posture correction.   Walking cone taps in //-bars with R UE assist/ moderate cuing to correct BOS/ posture.  Limited L hip flexion/ increase R lateral lean.     Sit to stands with focus on proper technique/ posture/ L LE wt. Bearing.  10x each.     Amb. In clinic without loftstrand working on maintaining proper BOS/ step pattern       PT Long Term Goals - 12/28/20 1156       PT LONG TERM GOAL #1   Title Pt will increase Berg Balance to 48/56 to demonstrate clinically significant improvement in balance decreasing pt's risk for falls during functional activities.    Baseline 2/3: 42/56    Time 8    Period Weeks    Status Not Met    Target Date 02/17/21      PT LONG TERM GOAL #2   Title Pt will  improve 10 m gait speed to > 0.5 m/s  with LRAD to improve safe community ambulation.    Baseline 2/3: 0.32 m/s with SPC and SBA/CGA    Time 8    Period Weeks    Status Not Met    Target Date 02/17/21      PT LONG TERM GOAL #3   Title Pt will improve 5xSTS to < 12 sec with no RUE support to demonstrate clinically significant improvement in BLE strength to improve STS t/f's like standing from the toilet.    Baseline 2/3: 13.5 sec.    Time 8    Period Weeks    Status Not Met    Target Date 02/17/21      PT LONG TERM GOAL #4   Title Pt will improve DGI by a minimum of 5 points to demonstrate clinically significant improvement and safety with community ambulation tasks to decrease risk of falls.    Baseline TBD    Time 8    Period Weeks    Status Not Met    Target Date 02/17/21              Pt. focused on improving gait pattern/ balance with and without use of Loftstrand crutch. Pt. requires cuing/ instruct to increase L hip/knee flexion during swing through phase of gait, as well as maintain BOS to prevent scissoring gait/ catch toe on ground. No LOB during tx. but extra time to initiate L LE movement/ consistent step pattern. Poor upright posture during dynamic balance tasks and instructed to use mirror for feedback. Significant L sh./ elbow tightness that improves with static stretching but no carryover with HEP. Poor L knee control during L LE step ups and unable to prevent L knee hyperextension.      Patient will benefit from skilled therapeutic intervention in order to improve the following deficits and impairments:     Visit Diagnosis: Hemiplegia and hemiparesis following cerebral infarction affecting left non-dominant side (HCC)  Other abnormalities of gait and mobility  Difficulty in walking, not elsewhere classified  Other lack of coordination  At risk for falls  Muscle weakness (generalized)  Pain in left arm  Unsteadiness on feet     Problem  List Patient Active Problem List   Diagnosis Date Noted   Type 2 diabetes mellitus with hyperglycemia ([place]) 75/17/0017   Diastolic dysfunction    History of diabetes with ketoacidosis 09/20/2020   CKD (chronic kidney disease) stage 2, GFR 60-89 ml/min 06/27/2020   Monitoring for anticoagulant use 06/27/2020   Lower  extremity edema 05/16/2020   Paroxysmal atrial fibrillation ([place]) 10/26/2019   Coagulopathy ([place]) 06/12/2019   Gastroesophageal reflux disease    Hemiplegia and hemiparesis following cerebral infarction affecting left non-dominant side ([place]) 11/25/2018   RBBB 10/15/2018   Dysphagia, post-stroke    Microalbuminuria 07/29/2018   Hyperlipidemia LDL goal <70 02/11/2017   Recurrent strokes ([place]) 11/10/2016   Essential hypertension 11/10/2016   Tobacco abuse 08/28/2013   Peripheral neuropathy 08/28/2013   Pura Spice, PT, DPT # [phone] 01/20/2021, 2:39 PM  [facility] [address] [place], [place], [address] Phone: [phone]   Fax:  [phone]  Name: Jacki Cones Townsend. MRN: [id number] Date of Birth: 07-06-1956

## 2021-01-23 ENCOUNTER — Other Ambulatory Visit: Payer: Self-pay

## 2021-01-23 NOTE — Patient Outreach (Signed)
Called patient to conduct initial visit, able to introduce myself and exchange pleasantries but we were disconnected. VM box full and unable to leave VM. Will try again in 2 weeks.

## 2021-01-27 ENCOUNTER — Other Ambulatory Visit: Payer: Self-pay

## 2021-01-27 ENCOUNTER — Ambulatory Visit: Payer: Medicaid Other | Admitting: Physical Therapy

## 2021-01-27 ENCOUNTER — Encounter: Payer: Self-pay | Admitting: Physical Therapy

## 2021-01-27 DIAGNOSIS — M79602 Pain in left arm: Secondary | ICD-10-CM

## 2021-01-27 DIAGNOSIS — M6281 Muscle weakness (generalized): Secondary | ICD-10-CM

## 2021-01-27 DIAGNOSIS — I69354 Hemiplegia and hemiparesis following cerebral infarction affecting left non-dominant side: Secondary | ICD-10-CM

## 2021-01-27 DIAGNOSIS — R278 Other lack of coordination: Secondary | ICD-10-CM

## 2021-01-27 DIAGNOSIS — R2689 Other abnormalities of gait and mobility: Secondary | ICD-10-CM

## 2021-01-27 DIAGNOSIS — R262 Difficulty in walking, not elsewhere classified: Secondary | ICD-10-CM

## 2021-01-27 DIAGNOSIS — Z9181 History of falling: Secondary | ICD-10-CM

## 2021-01-27 NOTE — Therapy (Signed)
[facility] [address] [place], [place], [address] Phone: [phone]   Fax:  [phone]  Physical Therapy Treatment  Patient Details  Name: Evan FEUERBORN Sr. MRN: [id number] Date of Birth: Sep 09, 1955 Referring Provider (PT): Lebron Conners MD   Encounter Date: 01/27/2021   PT End of Session - 01/27/21 1523     Visit Number 19    Number of Visits 24    Date for PT Re-Evaluation 02/17/21    Authorization - Visit Number 9    Authorization - Number of Visits 10    PT Start Time 6789    PT Stop Time 3810    PT Time Calculation (min) 64 min    Equipment Utilized During Treatment Gait belt    Activity Tolerance Patient tolerated treatment well    Behavior During Therapy [facility] for tasks assessed/performed             Past Medical History:  Diagnosis Date   Carotid arterial disease ([place])    a. 08/2018 Carotid U/S: min-mod RICA atherosclerosis w/o hemodynamically significant stenosis. Nl LICA.   Diabetes 1.5, managed as type 2 ([place])    Diastolic dysfunction    a. 08/2018 Echo: EF 65%. No rwma. Gr1 DD. Mild MR.   Diastolic dysfunction    a. 08/2019 Echo: EF 55-60%, Gr1 DD. No rwma. Mild MR. RVSP 37.29mHg.   Hypercholesterolemia    Hypertension    PAF (paroxysmal atrial fibrillation) ([place]    a. 10/2019 Event Monitor: PAF; b. CHA2DS2VASc = 5-->Eliquis.   Poorly controlled diabetes mellitus ([place]    a. 04/2019 A1c 13.8.   Recurrent strokes ([place]    a. 10/2016 MRI/A: Acute 545mR thalamic infarct, ? subacute infarct of R corona radiata; b. 08/2017 MRI/A: Acute 5m53materal L thalamic infarct. Other more remote lacunar infarcts of thalami bilat. Small vessel dzs; c. 08/2018 MRI/A: Acute lacunar infarct of the post limb of R internal capsule; d. 08/2019 MRI Acute CVA of L paramedian pons adn R cerebellar hemisphere.   Tobacco abuse     Past Surgical History:  Procedure Laterality Date   ESOPHAGOGASTRODUODENOSCOPY (EGD) WITH  PROPOFOL N/A 04/26/2019   Procedure: ESOPHAGOGASTRODUODENOSCOPY (EGD) WITH PROPOFOL;  Surgeon: VanLin LandsmanD;  [facility];  Service: Gastroenterology;  Laterality: N/A;    There were no vitals filed for this visit.   Subjective Assessment - 01/27/21 1534     Subjective Pt. entered PT with c/o 8/10 back pain after not sleeping well last night.  No LOB or falls reported.    Pertinent History Pertinent history and PMH: HTN, uncontrolled DM, HLD, R basal ganglia stroke on 08/2018 and L pointine and R cerebellar stroke 09/07/19, sinus bradycardia, peripheral neuropat    Patient Stated Goals Improve strengthening/ walking.  Return to fishing.    Currently in Pain? Yes    Pain Score 8     Pain Location Back    Pain Onset More than a month ago                    There.ex.:   Nustep L4 8 min. B UE/LE (discussed back pain).     L LE step ups at stairs with R UE assist 8x.  Cuing to prevent/ control L knee hyperextension.     Walking in //-bars with hip flexion/ step pattern with R UE assist to no UE assist in //-bars 5x.   Standing 4-way hip (no resistance)- 20x each.  Cuing for posture correction.  Static stretch of R elbow with 3# weight and heat pack for 12 mins.      Neuro:     Walking cone taps in //-bars, 6 inch hurdles, and 12 inch hurdles  with R UE assist/ moderate cuing to correct BOS/ posture.  Limited L hip flexion/ increase R lateral lean.PT provided CGA.  Cuing to prevent L hip circumduction   Sit to stands with focus on proper technique/ posture/ L LE wt. Bearing.  10x each.     Amb. In clinic without loftstrand and Min A working on maintaining proper BOS/ step pattern.  1 LOB with scissoring gait pattern and PT assist to correct.      Manual:  Supine L shoulder A/AROM, contract/relax, and L elbow stretches (MH to L shoulder/ elbow)- PT assist required  Global L finger ext contract/relax  to promote functional grip.         PT  Long Term Goals - 12/28/20 1156       PT LONG TERM GOAL #1   Title Pt will increase Berg Balance to 48/56 to demonstrate clinically significant improvement in balance decreasing pt's risk for falls during functional activities.    Baseline 2/3: 42/56    Time 8    Period Weeks    Status Not Met    Target Date 02/17/21      PT LONG TERM GOAL #2   Title Pt will improve 10 m gait speed to > 0.5 m/s  with LRAD to improve safe community ambulation.    Baseline 2/3: 0.32 m/s with SPC and SBA/CGA    Time 8    Period Weeks    Status Not Met    Target Date 02/17/21      PT LONG TERM GOAL #3   Title Pt will improve 5xSTS to < 12 sec with no RUE support to demonstrate clinically significant improvement in BLE strength to improve STS t/f's like standing from the toilet.    Baseline 2/3: 13.5 sec.    Time 8    Period Weeks    Status Not Met    Target Date 02/17/21      PT LONG TERM GOAL #4   Title Pt will improve DGI by a minimum of 5 points to demonstrate clinically significant improvement and safety with community ambulation tasks to decrease risk of falls.    Baseline TBD    Time 8    Period Weeks    Status Not Met    Target Date 02/17/21                   Plan - 01/27/21 1535     Clinical Impression Statement Pt demo'ed increased unsteadiness w/ amb d/t back pain. Slowly progressed balance and coordination exercises to maintain hip flexion for improving ground clearance. Lat pull down trialed for tricep activation and passive stretch during eccentric phase, unable to keep a proper form d/t back pain. Manual stretch and muscle energy technique prescribed to promote elbow passive and active ROM w/ heat. Pt reached near full extension.    Personal Factors and Comorbidities Age;Comorbidity 3+;Education;Finances;Past/Current Experience;Fitness;Transportation    Comorbidities HTN, uncontrolled DM, HLD, R basal ganglia stroke on 08/2018 and L pointine and R cerebellar stroke 09/07/19,  sinus bradycardia, peripheral neuropathy    Examination-Activity Limitations Bathing;Bed Mobility;Dressing;Transfers;Squat;Lift;Locomotion Level;Stairs;Reach Overhead;Carry;Stand;Sit    Examination-Participation Restrictions Community Activity;Interpersonal Relationship;Yard Work    Producer, television/film/video  PT Frequency 1x / week    PT Duration 8 weeks    PT Treatment/Interventions ADLs/Self Care Home Management;Aquatic Therapy;Electrical Stimulation;Moist Heat;DME Instruction;Gait training;Stair training;Functional mobility training;Therapeutic activities;Therapeutic exercise;Balance training;Neuromuscular re-education;Patient/family education;Orthotic Fit/Training;Dry needling;Manual techniques;Canalith Repostioning;Passive range of motion;Biofeedback    PT Next Visit Plan L UE/LE strengthening/ dynamic balance tasks.   10th visit progress note next tx. session.    PT Home Exercise Plan HEP: standing hip marches/abd, ankle DF/PF in sitting/supine, passive L elbow stretch for tone    Consulted and Agree with Plan of Care Patient    Family Member Consulted wife             Patient will benefit from skilled therapeutic intervention in order to improve the following deficits and impairments:  Abnormal gait, Decreased coordination, Decreased range of motion, Difficulty walking, Impaired tone, Decreased endurance, Impaired UE functional use, Decreased activity tolerance, Impaired perceived functional ability, Decreased balance, Impaired flexibility, Decreased cognition, Decreased mobility, Decreased strength, Increased edema, Postural dysfunction, Pain  Visit Diagnosis: Hemiplegia and hemiparesis following cerebral infarction affecting left non-dominant side (HCC)  Other abnormalities of gait and mobility  Difficulty in walking, not elsewhere classified  Other lack of coordination  At risk for falls  Muscle weakness  (generalized)  Pain in left arm     Problem List Patient Active Problem List   Diagnosis Date Noted   Type 2 diabetes mellitus with hyperglycemia ([place]) 06/15/1313   Diastolic dysfunction    History of diabetes with ketoacidosis 09/20/2020   CKD (chronic kidney disease) stage 2, GFR 60-89 ml/min 06/27/2020   Monitoring for anticoagulant use 06/27/2020   Lower extremity edema 05/16/2020   Paroxysmal atrial fibrillation (HCC) 10/26/2019   Coagulopathy ([place]) 06/12/2019   Gastroesophageal reflux disease    Hemiplegia and hemiparesis following cerebral infarction affecting left non-dominant side ([place]) 11/25/2018   RBBB 10/15/2018   Dysphagia, post-stroke    Microalbuminuria 07/29/2018   Hyperlipidemia LDL goal <70 02/11/2017   Recurrent strokes ([place]) 11/10/2016   Essential hypertension 11/10/2016   Tobacco abuse 08/28/2013   Peripheral neuropathy 08/28/2013   Pura Spice, PT, DPT # [phone] 01/27/2021, 8:26 PM  [facility] [address] [place], [place], [address] Phone: [phone]   Fax:  [phone]  Name: Evan Cones Sr. MRN: [id number] Date of Birth: 1955-12-04

## 2021-01-30 ENCOUNTER — Other Ambulatory Visit: Payer: Self-pay | Admitting: Family Medicine

## 2021-02-03 ENCOUNTER — Other Ambulatory Visit: Payer: Self-pay

## 2021-02-03 ENCOUNTER — Encounter: Payer: Self-pay | Admitting: Physical Therapy

## 2021-02-03 ENCOUNTER — Ambulatory Visit: Payer: Medicaid Other | Admitting: Physical Therapy

## 2021-02-03 DIAGNOSIS — Z9181 History of falling: Secondary | ICD-10-CM

## 2021-02-03 DIAGNOSIS — R2689 Other abnormalities of gait and mobility: Secondary | ICD-10-CM

## 2021-02-03 DIAGNOSIS — R262 Difficulty in walking, not elsewhere classified: Secondary | ICD-10-CM

## 2021-02-03 DIAGNOSIS — I69354 Hemiplegia and hemiparesis following cerebral infarction affecting left non-dominant side: Secondary | ICD-10-CM

## 2021-02-03 DIAGNOSIS — R278 Other lack of coordination: Secondary | ICD-10-CM

## 2021-02-03 DIAGNOSIS — M79602 Pain in left arm: Secondary | ICD-10-CM

## 2021-02-03 DIAGNOSIS — M6281 Muscle weakness (generalized): Secondary | ICD-10-CM

## 2021-02-03 NOTE — Therapy (Signed)
[facility] [address] [place], [place], [address] Phone: [phone]   Fax:  [phone]  Physical Therapy Treatment Physical Therapy Progress Note   Dates of reporting period  11/14/2020 to 02/03/2021  Patient Details  Name: Evan MILLETTE Sr. MRN: [id number] Date of Birth: [birth year] Referring Provider (PT): Lebron Conners MD   Encounter Date: 02/03/2021   PT End of Session - 02/03/21 1442     Visit Number 20    Number of Visits 24    Date for PT Re-Evaluation 02/17/21    Authorization - Visit Number 10    Authorization - Number of Visits 10    PT Start Time 1430    PT Stop Time 1526    PT Time Calculation (min) 56 min    Equipment Utilized During Treatment Gait belt    Activity Tolerance Patient tolerated treatment well    Behavior During Therapy [facility] for tasks assessed/performed             Past Medical History:  Diagnosis Date   Carotid arterial disease ([place])    a. 08/2018 Carotid U/S: min-mod RICA atherosclerosis w/o hemodynamically significant stenosis. Nl LICA.   Diabetes 1.5, managed as type 2 ([place])    Diastolic dysfunction    a. 08/2018 Echo: EF 65%. No rwma. Gr1 DD. Mild MR.   Diastolic dysfunction    a. 08/2019 Echo: EF 55-60%, Gr1 DD. No rwma. Mild MR. RVSP 37.76mHg.   Hypercholesterolemia    Hypertension    PAF (paroxysmal atrial fibrillation) ([place]    a. 10/2019 Event Monitor: PAF; b. CHA2DS2VASc = 5-->Eliquis.   Poorly controlled diabetes mellitus ([place]    a. 04/2019 A1c 13.8.   Recurrent strokes ([place]    a. 10/2016 MRI/A: Acute 51mR thalamic infarct, ? subacute infarct of R corona radiata; b. 08/2017 MRI/A: Acute 39m39materal L thalamic infarct. Other more remote lacunar infarcts of thalami bilat. Small vessel dzs; c. 08/2018 MRI/A: Acute lacunar infarct of the post limb of R internal capsule; d. 08/2019 MRI Acute CVA of L paramedian pons adn R cerebellar hemisphere.   Tobacco abuse     Past  Surgical History:  Procedure Laterality Date   ESOPHAGOGASTRODUODENOSCOPY (EGD) WITH PROPOFOL N/A 04/26/2019   Procedure: ESOPHAGOGASTRODUODENOSCOPY (EGD) WITH PROPOFOL;  Surgeon: VanLin LandsmanD;  [facility];  Service: Gastroenterology;  Laterality: N/A;    There were no vitals filed for this visit.   Subjective Assessment - 02/03/21 1437     Subjective Pt reports "back hurts no more, a little sore in the back of L thigh".  Had fun over weekend at a party, no LOB/fall after 20 beers."  Didn't not exercise d/t sleeping.    Pertinent History Pertinent history and PMH: HTN, uncontrolled DM, HLD, R basal ganglia stroke on 08/2018 and L pointine and R cerebellar stroke 09/07/19, sinus bradycardia, peripheral neuropat    Patient Stated Goals Improve strengthening/ walking.  Return to fishing.    Currently in Pain? No/denies    Pain Score 0-No pain    Pain Onset More than a month ago                There.ex.:   Nustep L4 10 min. B UE/LE     Standing 4-way hip (no resistance)- 20x each.  Cuing for posture correction.       Neuro:     Sitting 3ft66fom wall: ball kick to the wall and catch when it  bounce back L-R x10  Standing3ft from wall: ball kick to the wall and catch when it bounce back L-R x10   Sit to stands with focus on proper technique/ posture/ L LE wt. Bearing.  10x each.     Amb. In clinic with loftstrand and Min A working on maintaining proper BOS/ step pattern with GTB around knee. Pt responded well by maintaining the knee apart.        Manual:   Supine L shoulder A/AROM, contract/relax, and L elbow stretches (MH to L shoulder/ elbow)- PT assist required   Global L finger ext contract/relax  to promote functional grip.          PT Long Term Goals - 12/28/20 1156       PT LONG TERM GOAL #1   Title Pt will increase Berg Balance to 48/56 to demonstrate clinically significant improvement in balance decreasing pt's risk for falls during  functional activities.    Baseline 2/3: 42/56    Time 8    Period Weeks    Status Not Met    Target Date 02/17/21      PT LONG TERM GOAL #2   Title Pt will improve 10 m gait speed to > 0.5 m/s  with LRAD to improve safe community ambulation.    Baseline 2/3: 0.32 m/s with SPC and SBA/CGA    Time 8    Period Weeks    Status Not Met    Target Date 02/17/21      PT LONG TERM GOAL #3   Title Pt will improve 5xSTS to < 12 sec with no RUE support to demonstrate clinically significant improvement in BLE strength to improve STS t/f's like standing from the toilet.    Baseline 2/3: 13.5 sec.    Time 8    Period Weeks    Status Not Met    Target Date 02/17/21      PT LONG TERM GOAL #4   Title Pt will improve DGI by a minimum of 5 points to demonstrate clinically significant improvement and safety with community ambulation tasks to decrease risk of falls.    Baseline TBD    Time 8    Period Weeks    Status Not Met    Target Date 02/17/21                   Plan - 02/03/21 1531     Clinical Impression Statement Pt was able to keep wider BOS with GTB around knee during ambulation. R LE demo'ed good coordination and priprioception w/ ball kicking. L LE was challenged to control the ball while kicking d/t difficulty lifting up and weakness in hip contraction. Pt continues to progress toward full L elbow extension with static stretch and heat.  See updated goals for 10th visit progress note.    Personal Factors and Comorbidities Age;Comorbidity 3+;Education;Finances;Past/Current Experience;Fitness;Transportation    Comorbidities HTN, uncontrolled DM, HLD, R basal ganglia stroke on 08/2018 and L pointine and R cerebellar stroke 09/07/19, sinus bradycardia, peripheral neuropathy    Examination-Activity Limitations Bathing;Bed Mobility;Dressing;Transfers;Squat;Lift;Locomotion Level;Stairs;Reach Overhead;Carry;Stand;Sit    Examination-Participation Restrictions Community  Activity;Interpersonal Relationship;Yard Work    Stability/Clinical Decision Making Evolving/Moderate complexity    Clinical Decision Making Moderate    Rehab Potential Fair    PT Frequency 1x / week    PT Duration 8 weeks    PT Treatment/Interventions ADLs/Self Care Home Management;Aquatic Therapy;Electrical Stimulation;Moist Heat;DME Instruction;Gait training;Stair training;Functional mobility training;Therapeutic activities;Therapeutic exercise;Balance training;Neuromuscular re-education;Patient/family education;Orthotic Fit/Training;Dry   needling;Manual techniques;Canalith Repostioning;Passive range of motion;Biofeedback    PT Next Visit Plan B hip flexor strengthening for ground clearance.    PT Home Exercise Plan HEP: standing hip marches/abd, ankle DF/PF in sitting/supine, passive L elbow stretch for tone    Consulted and Agree with Plan of Care Patient    Family Member Consulted wife             Patient will benefit from skilled therapeutic intervention in order to improve the following deficits and impairments:  Abnormal gait, Decreased coordination, Decreased range of motion, Difficulty walking, Impaired tone, Decreased endurance, Impaired UE functional use, Decreased activity tolerance, Impaired perceived functional ability, Decreased balance, Impaired flexibility, Decreased cognition, Decreased mobility, Decreased strength, Increased edema, Postural dysfunction, Pain  Visit Diagnosis: Hemiplegia and hemiparesis following cerebral infarction affecting left non-dominant side (HCC)  Other abnormalities of gait and mobility  Difficulty in walking, not elsewhere classified  Other lack of coordination  At risk for falls  Muscle weakness (generalized)  Pain in left arm     Problem List Patient Active Problem List   Diagnosis Date Noted   Type 2 diabetes mellitus with hyperglycemia (HCC) 10/29/2020   Diastolic dysfunction    History of diabetes with ketoacidosis  09/20/2020   CKD (chronic kidney disease) stage 2, GFR 60-89 ml/min 06/27/2020   Monitoring for anticoagulant use 06/27/2020   Lower extremity edema 05/16/2020   Paroxysmal atrial fibrillation (HCC) 10/26/2019   Coagulopathy (HCC) 06/12/2019   Gastroesophageal reflux disease    Hemiplegia and hemiparesis following cerebral infarction affecting left non-dominant side (HCC) 11/25/2018   RBBB 10/15/2018   Dysphagia, post-stroke    Microalbuminuria 07/29/2018   Hyperlipidemia LDL goal <70 02/11/2017   Recurrent strokes (HCC) 11/10/2016   Essential hypertension 11/10/2016   Tobacco abuse 08/28/2013   Peripheral neuropathy 08/28/2013   Evan Townsend, PT, DPT # [id number]  , SPT 02/04/2021, 3:46 PM  [facility] [address] [place], [place], [address] Phone: [phone]   Fax:  [phone]  Name: Evan E Pousson Sr. MRN: [id number] Date of Birth: 02/25/1956    

## 2021-02-05 ENCOUNTER — Other Ambulatory Visit: Payer: Self-pay

## 2021-02-10 ENCOUNTER — Other Ambulatory Visit: Payer: Self-pay

## 2021-02-10 ENCOUNTER — Encounter: Payer: Self-pay | Admitting: Physical Therapy

## 2021-02-10 ENCOUNTER — Ambulatory Visit: Payer: Medicaid Other

## 2021-02-10 DIAGNOSIS — I69354 Hemiplegia and hemiparesis following cerebral infarction affecting left non-dominant side: Secondary | ICD-10-CM | POA: Diagnosis not present

## 2021-02-10 DIAGNOSIS — R262 Difficulty in walking, not elsewhere classified: Secondary | ICD-10-CM

## 2021-02-10 DIAGNOSIS — R278 Other lack of coordination: Secondary | ICD-10-CM

## 2021-02-10 DIAGNOSIS — Z9181 History of falling: Secondary | ICD-10-CM

## 2021-02-10 DIAGNOSIS — M6281 Muscle weakness (generalized): Secondary | ICD-10-CM

## 2021-02-10 NOTE — Therapy (Signed)
[facility] [address] [place], [place], [address] Phone: [phone]   Fax:  [phone]  Physical Therapy Treatment  Patient Details  Name: Evan ROSAMOND Sr. MRN: [id number] Date of Birth: 03/26/1956 Referring Provider (PT): Lebron Conners MD   Encounter Date: 02/10/2021   PT End of Session - 02/10/21 1542     Visit Number 21    Number of Visits 24    Date for PT Re-Evaluation 02/17/21    Authorization Type [place] Medicaid    Authorization Time Period 6/1-8/5 8 PT visits    Authorization - Visit Number 2    Authorization - Number of Visits 8    PT Start Time 1435    PT Stop Time 1517    PT Time Calculation (min) 42 min    Equipment Utilized During Treatment Gait belt    Activity Tolerance Patient tolerated treatment well    Behavior During Therapy WFL for tasks assessed/performed             Past Medical History:  Diagnosis Date   Carotid arterial disease ([place])    a. 08/2018 Carotid U/S: min-mod RICA atherosclerosis w/o hemodynamically significant stenosis. Nl LICA.   Diabetes 1.5, managed as type 2 ([place])    Diastolic dysfunction    a. 08/2018 Echo: EF 65%. No rwma. Gr1 DD. Mild MR.   Diastolic dysfunction    a. 08/2019 Echo: EF 55-60%, Gr1 DD. No rwma. Mild MR. RVSP 37.37mHg.   Hypercholesterolemia    Hypertension    PAF (paroxysmal atrial fibrillation) ([place]    a. 10/2019 Event Monitor: PAF; b. CHA2DS2VASc = 5-->Eliquis.   Poorly controlled diabetes mellitus ([place]    a. 04/2019 A1c 13.8.   Recurrent strokes ([place]    a. 10/2016 MRI/A: Acute 564mR thalamic infarct, ? subacute infarct of R corona radiata; b. 08/2017 MRI/A: Acute 30m26materal L thalamic infarct. Other more remote lacunar infarcts of thalami bilat. Small vessel dzs; c. 08/2018 MRI/A: Acute lacunar infarct of the post limb of R internal capsule; d. 08/2019 MRI Acute CVA of L paramedian pons adn R cerebellar hemisphere.   Tobacco abuse     Past Surgical  History:  Procedure Laterality Date   ESOPHAGOGASTRODUODENOSCOPY (EGD) WITH PROPOFOL N/A 04/26/2019   Procedure: ESOPHAGOGASTRODUODENOSCOPY (EGD) WITH PROPOFOL;  Surgeon: VanLin LandsmanD;  [facility];  Service: Gastroenterology;  Laterality: N/A;    There were no vitals filed for this visit.   Subjective Assessment - 02/10/21 1541     Subjective No pain or LOB reported. Pt states good adherence to HEP.    Pertinent History Pertinent history and PMH: HTN, uncontrolled DM, HLD, R basal ganglia stroke on 08/2018 and L pointine and R cerebellar stroke 09/07/19, sinus bradycardia, peripheral neuropat    Patient Stated Goals Improve strengthening/ walking.  Return to fishing.    Currently in Pain? No/denies                Treatment   Nustep L4 10 min. B UE/LE      Standing 4-way hip (no resistance)- 20x each.  Cuing for posture correction.   There.ex.:   Nustep L4 10 min. B UE/LE      Standing 4-way hip (no resistance)- 20x each.  Cuing for posture correction.  Sit to stand x10. Ed on body and foot placement.   Neuro:   Amb. In clinic with [place] and Min A working on maintaining proper BOS/ step pattern with  GTB around knee. Pt responded well by maintaining the knee apart.     Hallway: negotiating around cone with Min A, pick up cones from floor, x10 minute.    PT Long Term Goals - 12/28/20 1156       PT LONG TERM GOAL #1   Title Pt will increase Berg Balance to 48/56 to demonstrate clinically significant improvement in balance decreasing pt's risk for falls during functional activities.    Baseline 2/3: 42/56    Time 8    Period Weeks    Status Not Met    Target Date 02/17/21      PT LONG TERM GOAL #2   Title Pt will improve 10 m gait speed to > 0.5 m/s  with LRAD to improve safe community ambulation.    Baseline 2/3: 0.32 m/s with SPC and SBA/CGA    Time 8    Period Weeks    Status Not Met    Target Date 02/17/21      PT LONG TERM  GOAL #3   Title Pt will improve 5xSTS to < 12 sec with no RUE support to demonstrate clinically significant improvement in BLE strength to improve STS t/f's like standing from the toilet.    Baseline 2/3: 13.5 sec.    Time 8    Period Weeks    Status Not Met    Target Date 02/17/21      PT LONG TERM GOAL #4   Title Pt will improve DGI by a minimum of 5 points to demonstrate clinically significant improvement and safety with community ambulation tasks to decrease risk of falls.    Baseline TBD    Time 8    Period Weeks    Status Not Met    Target Date 02/17/21                   Plan - 02/10/21 1555     Clinical Impression Statement Pt demonstrated improved coordination negotiating around cones with only usage of [place] in R UE. Picking up the cones from floor and sit to stand were added to this session to practice correct body mechanics. Pt tolerated fucntional activities with 1 LOB needed CGA to ensure safety. Safety awareness around obstacles and standing up from low surface remains difficult as evident by knocking over cones and forward foot placement standing up from low table. PT services is required for achieving functional goal to return to independent ADL.    Personal Factors and Comorbidities Age;Comorbidity 3+;Education;Finances;Past/Current Experience;Fitness;Transportation    Comorbidities HTN, uncontrolled DM, HLD, R basal ganglia stroke on 08/2018 and L pointine and R cerebellar stroke 09/07/19, sinus bradycardia, peripheral neuropathy    Examination-Activity Limitations Bathing;Bed Mobility;Dressing;Transfers;Squat;Lift;Locomotion Level;Stairs;Reach Overhead;Carry;Stand;Sit    Examination-Participation Restrictions Community Activity;Interpersonal Relationship;Yard Work    Merchant [employer] officer Evolving/Moderate complexity    Clinical Decision Making Moderate    Rehab Potential Fair    PT Frequency 1x / week    PT Duration 8 weeks    PT  Treatment/Interventions ADLs/Self Care Home Management;Aquatic Therapy;Electrical Stimulation;Moist Heat;DME Instruction;Gait training;Stair training;Functional mobility training;Therapeutic activities;Therapeutic exercise;Balance training;Neuromuscular re-education;Patient/family education;Orthotic Fit/Training;Dry needling;Manual techniques;Canalith Repostioning;Passive range of motion;Biofeedback    PT Next Visit Plan B hip flexor strengthening for ground clearance.    PT Home Exercise Plan HEP: standing hip marches/abd, ankle DF/PF in sitting/supine, passive L elbow stretch for tone    Consulted and Agree with Plan of Care Patient    Family Member Consulted wife  Patient will benefit from skilled therapeutic intervention in order to improve the following deficits and impairments:  Abnormal gait, Decreased coordination, Decreased range of motion, Difficulty walking, Impaired tone, Decreased endurance, Impaired UE functional use, Decreased activity tolerance, Impaired perceived functional ability, Decreased balance, Impaired flexibility, Decreased cognition, Decreased mobility, Decreased strength, Increased edema, Postural dysfunction, Pain  Visit Diagnosis: Hemiplegia and hemiparesis following cerebral infarction affecting left non-dominant side (HCC)  Difficulty in walking, not elsewhere classified  Other lack of coordination  At risk for falls  Muscle weakness (generalized)     Problem List Patient Active Problem List   Diagnosis Date Noted   Type 2 diabetes mellitus with hyperglycemia ([place]) 96/72/8979   Diastolic dysfunction    History of diabetes with ketoacidosis 09/20/2020   CKD (chronic kidney disease) stage 2, GFR 60-89 ml/min 06/27/2020   Monitoring for anticoagulant use 06/27/2020   Lower extremity edema 05/16/2020   Paroxysmal atrial fibrillation (HCC) 10/26/2019   Coagulopathy ([place]) 06/12/2019   Gastroesophageal reflux disease    Hemiplegia and  hemiparesis following cerebral infarction affecting left non-dominant side ([place]) 11/25/2018   RBBB 10/15/2018   Dysphagia, post-stroke    Microalbuminuria 07/29/2018   Hyperlipidemia LDL goal <70 02/11/2017   Recurrent strokes ([place]) 11/10/2016   Essential hypertension 11/10/2016   Tobacco abuse 08/28/2013   Peripheral neuropathy 08/28/2013    Shirley Friar 02/10/2021, 4:34 PM   [facility] [address] [place], [place], [address] Phone: [phone]   Fax:  [phone]  Name: Evan Cones Sr. MRN: [id number] Date of Birth: [birth year]

## 2021-02-12 ENCOUNTER — Other Ambulatory Visit: Payer: Self-pay

## 2021-02-12 NOTE — Patient Outreach (Signed)
Spoke with spouse Evan Townsend for a bit before we got disconnected. Only introduced myself and told her where I was calling from. Called back 3 times, unable to get in touch. Left VM and they can call me back if they wish to schedule a f/u appt but I will refrain from calling them for now.

## 2021-02-19 ENCOUNTER — Ambulatory Visit: Payer: Medicaid Other | Attending: Internal Medicine

## 2021-02-19 ENCOUNTER — Other Ambulatory Visit: Payer: Self-pay

## 2021-02-19 DIAGNOSIS — Z9181 History of falling: Secondary | ICD-10-CM | POA: Diagnosis not present

## 2021-02-19 DIAGNOSIS — R2681 Unsteadiness on feet: Secondary | ICD-10-CM | POA: Insufficient documentation

## 2021-02-19 DIAGNOSIS — R278 Other lack of coordination: Secondary | ICD-10-CM | POA: Diagnosis not present

## 2021-02-19 DIAGNOSIS — M6281 Muscle weakness (generalized): Secondary | ICD-10-CM | POA: Insufficient documentation

## 2021-02-19 DIAGNOSIS — I69354 Hemiplegia and hemiparesis following cerebral infarction affecting left non-dominant side: Secondary | ICD-10-CM | POA: Diagnosis not present

## 2021-02-19 DIAGNOSIS — R262 Difficulty in walking, not elsewhere classified: Secondary | ICD-10-CM | POA: Insufficient documentation

## 2021-02-19 DIAGNOSIS — R471 Dysarthria and anarthria: Secondary | ICD-10-CM | POA: Diagnosis not present

## 2021-02-19 DIAGNOSIS — R2689 Other abnormalities of gait and mobility: Secondary | ICD-10-CM | POA: Diagnosis not present

## 2021-02-19 NOTE — Therapy (Addendum)
[facility] [address] [place], [place], [address] Phone: [phone]   Fax:  [phone]  Physical Therapy Treatment and RECERT/Discharge  Plan of Care: 10/29/2020-02/17/2021  Patient Details  Name: Evan FEDERICI Sr. MRN: [id number] Date of Birth: Jan 04, 1956 Referring Provider (PT): Lebron Conners MD   Encounter Date: 02/19/2021   PT End of Session - 02/20/21 0727     Visit Number 22    Number of Visits 24    Date for PT Re-Evaluation 02/17/21    Authorization Type [place] Medicaid; MD cert 11/16/91-02/22/01 (prior cert 4/0/97-3/53/29)    Authorization Time Period 8 PT visits 6/1-8/5; 2/10-5/24 (15 visits approved, 16 visits used)    Authorization - Visit Number 3    Authorization - Number of Visits 8    PT Start Time 1435    PT Stop Time 1530    PT Time Calculation (min) 55 min    Equipment Utilized During Treatment Gait belt    Activity Tolerance Patient tolerated treatment well    Behavior During Therapy WFL for tasks assessed/performed             Past Medical History:  Diagnosis Date   Carotid arterial disease ([place])    a. 08/2018 Carotid U/S: min-mod RICA atherosclerosis w/o hemodynamically significant stenosis. Nl LICA.   Diabetes 1.5, managed as type 2 ([place])    Diastolic dysfunction    a. 08/2018 Echo: EF 65%. No rwma. Gr1 DD. Mild MR.   Diastolic dysfunction    a. 08/2019 Echo: EF 55-60%, Gr1 DD. No rwma. Mild MR. RVSP 37.19mHg.   Hypercholesterolemia    Hypertension    PAF (paroxysmal atrial fibrillation) ([place]    a. 10/2019 Event Monitor: PAF; b. CHA2DS2VASc = 5-->Eliquis.   Poorly controlled diabetes mellitus ([place]    a. 04/2019 A1c 13.8.   Recurrent strokes ([place]    a. 10/2016 MRI/A: Acute 563mR thalamic infarct, ? subacute infarct of R corona radiata; b. 08/2017 MRI/A: Acute 34m57materal L thalamic infarct. Other more remote lacunar infarcts of thalami bilat. Small vessel dzs; c. 08/2018 MRI/A: Acute lacunar  infarct of the post limb of R internal capsule; d. 08/2019 MRI Acute CVA of L paramedian pons adn R cerebellar hemisphere.   Tobacco abuse     Past Surgical History:  Procedure Laterality Date   ESOPHAGOGASTRODUODENOSCOPY (EGD) WITH PROPOFOL N/A 04/26/2019   Procedure: ESOPHAGOGASTRODUODENOSCOPY (EGD) WITH PROPOFOL;  Surgeon: VanLin LandsmanD;  [facility];  Service: Gastroenterology;  Laterality: N/A;    There were no vitals filed for this visit.   Subjective Assessment - 02/20/21 0722     Subjective Pt presents to tx without complaints of pain. Pt stated that he has improved 80% from initial eval. Pt stated that  he is happy with his progress and agreeable to d/c at this time.    Pertinent History Pertinent history and PMH: HTN, uncontrolled DM, HLD, R basal ganglia stroke on 08/2018 and L pointine and R cerebellar stroke 09/07/19, sinus bradycardia, peripheral neuropat    Patient Stated Goals Improve strengthening/ walking.  Return to fishing.    Currently in Pain? No/denies    Pain Score 0-No pain            Therapeutic Exercise:  Nutstep L4. Dicussed d/c and importance of HEP adherence for maintenance of independence with ADLs. Not billed.  See updated HEP: Sets and reps perform with pt with minor verbal cueing to ensure correct form. Pt  demonstrates good form and understanding for continued adherence.   Access Code: [email or web address] URL: [email or web address] Date: 02/19/2021 Prepared by: Dorcas Carrow  Exercises  Supported Elbow Flexion Extension PROM - 1 x daily - 4 x weekly - 3 sets - 1 minute hold Seated Heel Toe Raises - 1 x daily - 4 x weekly - 3 sets - 10 reps Seated Hip Flexion March with Ankle Weights - 1 x daily - 4 x weekly - 3 sets - 10 reps Seated Hip Abduction with Resistance - 1 x daily - 4 x weekly - 3 sets - 10 reps Standing Hip Abduction with Counter Support - 1 x daily - 4 x weekly - 3 sets - 10 reps Standing March with Counter  Support - 1 x daily - 4 x weekly - 3 sets - 10 reps Standing Alternating Knee Flexion with Ankle Weights - 1 x daily - 4 x weekly - 3 sets - 10 reps Sit to Stand - 1 x daily - 4 x weekly - 3 sets - 10 reps    Manual Therapy:   Prolong stretching: L elbow ext (71mns) with STM  And moist hot pack to the bicep for increase ROM. Pt was educated on proper form and technique for in home adherence to maintain therapeutic gains during POC. Skin assessed after moist heat removed.          PT Long Term Goals - 02/19/21 1445       PT LONG TERM GOAL #1   Title Pt will increase Berg Balance to 48/56 to demonstrate clinically significant improvement in balance decreasing pt's risk for falls during functional activities.    Baseline 2/3: 42/56 7/7: 36    Time 8    Period Weeks    Status Not Met    Target Date 02/19/21      PT LONG TERM GOAL #2   Title Pt will improve 10 m gait speed to > 0.5 m/s  with LRAD to improve safe community ambulation.    Baseline 2/3: 0.32 m/s with SPC and SBA/CGA 7/6: 0.32 m/s with SPC and SBA/CGA    Time 8    Period Weeks    Status Not Met    Target Date 02/19/21      PT LONG TERM GOAL #3   Title Pt will improve 5xSTS to < 12 sec with no RUE support to demonstrate clinically significant improvement in BLE strength to improve STS t/f's like standing from the toilet.    Baseline 2/3: 13.5 sec. 7/6: 18.5 secs    Time 8    Period Weeks    Status Not Met    Target Date 02/19/21      PT LONG TERM GOAL #4   Title Pt will improve DGI by a minimum of 5 points to demonstrate clinically significant improvement and safety with community ambulation tasks to decrease risk of falls.    Baseline TBD: 7/6: not reassess due to no previous measure.    Time 8    Period Weeks    Status Not Met    Target Date 02/19/21      PT LONG TERM GOAL #5   Title Pt will improve FOTO score to target amount to demonstrate perceived improvements in ability to complete ADL's.     Baseline 10/20: Perform next session./7/6: not reassess due to no previous measure.    Status Not Met    Target Date 02/19/21  Plan - 02/19/21 1501     Clinical Impression Statement Pt stated good progression and agreeable to d/c after this tx due to pt reported 80% progression of initial eval and to conserve remaining PT visits given by insurance for the rest of the year. Pt displayed regression in BERG balance score(4=36) and 5xSTS(18.5 sec). No improvement was found in the 10MWT(0.32 m/s). FOTO, DGI, and TUG was not reassess due to no previous assessment during this current POC. Pt was provided with an updated HEP and education for optimal adherence in the home for maintenance of functional independence of ADLs. Pt was d/c with poor-fair prognosis 2/2 regression or plateau of goals.    Personal Factors and Comorbidities Age;Comorbidity 3+;Education;Finances;Past/Current Experience;Fitness;Transportation    Comorbidities HTN, uncontrolled DM, HLD, R basal ganglia stroke on 08/2018 and L pointine and R cerebellar stroke 09/07/19, sinus bradycardia, peripheral neuropathy    Examination-Activity Limitations Bathing;Bed Mobility;Dressing;Transfers;Squat;Lift;Locomotion Level;Stairs;Reach Overhead;Carry;Stand;Sit    Examination-Participation Restrictions Community Activity;Interpersonal Relationship;Yard Work    Merchant navy officer Evolving/Moderate complexity    Clinical Decision Making Moderate    Rehab Potential Fair    PT Frequency 1x / week    PT Duration Other (comment)   1 week   PT Treatment/Interventions ADLs/Self Care Home Management;Aquatic Therapy;Electrical Stimulation;Moist Heat;DME Instruction;Gait training;Stair training;Functional mobility training;Therapeutic activities;Therapeutic exercise;Balance training;Neuromuscular re-education;Patient/family education;Orthotic Fit/Training;Dry needling;Manual techniques;Canalith Repostioning;Passive range  of motion;Biofeedback    PT Next Visit Plan D/c from PT    PT Home Exercise Plan HEP: standing hip marches/abd, ankle DF/PF in sitting/supine, passive L elbow stretch for tone    Consulted and Agree with Plan of Care Patient    Family Member Consulted wife             Patient will benefit from skilled therapeutic intervention in order to improve the following deficits and impairments:  Abnormal gait, Decreased coordination, Decreased range of motion, Difficulty walking, Impaired tone, Decreased endurance, Impaired UE functional use, Decreased activity tolerance, Impaired perceived functional ability, Decreased balance, Impaired flexibility, Decreased cognition, Decreased mobility, Decreased strength, Increased edema, Postural dysfunction, Pain  Visit Diagnosis: Hemiplegia and hemiparesis following cerebral infarction affecting left non-dominant side ([place]) - Plan: PT plan of care cert/re-cert  Difficulty in walking, not elsewhere classified - Plan: PT plan of care cert/re-cert  Other lack of coordination - Plan: PT plan of care cert/re-cert  At risk for falls - Plan: PT plan of care cert/re-cert  Unsteadiness on feet - Plan: PT plan of care cert/re-cert  Other abnormalities of gait and mobility - Plan: PT plan of care cert/re-cert  Muscle weakness (generalized) - Plan: PT plan of care cert/re-cert  Dysarthria and anarthria - Plan: PT plan of care cert/re-cert     Problem List Patient Active Problem List   Diagnosis Date Noted   Type 2 diabetes mellitus with hyperglycemia ([place]) 60/05/9322   Diastolic dysfunction    History of diabetes with ketoacidosis 09/20/2020   CKD (chronic kidney disease) stage 2, GFR 60-89 ml/min 06/27/2020   Monitoring for anticoagulant use 06/27/2020   Lower extremity edema 05/16/2020   Paroxysmal atrial fibrillation ([place]) 10/26/2019   Coagulopathy ([place]) 06/12/2019   Gastroesophageal reflux disease    Hemiplegia and hemiparesis following cerebral  infarction affecting left non-dominant side ([place]) 11/25/2018   RBBB 10/15/2018   Dysphagia, post-stroke    Microalbuminuria 07/29/2018   Hyperlipidemia LDL goal <70 02/11/2017   Recurrent strokes ([place]) 11/10/2016   Essential hypertension 11/10/2016   Tobacco abuse 08/28/2013   Peripheral neuropathy 08/28/2013  Richardson Chiquito Pegge Cumberledge SPT  [place]. Fairly IV, PT, DPT Physical Therapist- [facility]  02/20/2021, 11:09 AM  [facility] [address] [place], [place], [address] Phone: [phone]   Fax:  [phone]  Name: Evan Cones Sr. MRN: [id number] Date of Birth: 02/15/1956

## 2021-02-24 ENCOUNTER — Ambulatory Visit: Payer: Medicaid Other | Admitting: Podiatry

## 2021-02-26 ENCOUNTER — Other Ambulatory Visit: Payer: Self-pay

## 2021-02-26 ENCOUNTER — Encounter: Payer: Medicaid Other | Admitting: Physical Therapy

## 2021-02-26 ENCOUNTER — Telehealth: Payer: Self-pay

## 2021-02-26 ENCOUNTER — Ambulatory Visit: Payer: Medicaid Other | Admitting: Internal Medicine

## 2021-02-26 ENCOUNTER — Encounter: Payer: Self-pay | Admitting: Internal Medicine

## 2021-02-26 VITALS — BP 144/80 | HR 61 | Temp 97.7°F | Ht 65.55 in | Wt 161.2 lb

## 2021-02-26 DIAGNOSIS — Z8673 Personal history of transient ischemic attack (TIA), and cerebral infarction without residual deficits: Secondary | ICD-10-CM | POA: Diagnosis not present

## 2021-02-26 DIAGNOSIS — E1165 Type 2 diabetes mellitus with hyperglycemia: Secondary | ICD-10-CM

## 2021-02-26 DIAGNOSIS — M62838 Other muscle spasm: Secondary | ICD-10-CM | POA: Insufficient documentation

## 2021-02-26 DIAGNOSIS — I10 Essential (primary) hypertension: Secondary | ICD-10-CM

## 2021-02-26 DIAGNOSIS — F321 Major depressive disorder, single episode, moderate: Secondary | ICD-10-CM | POA: Insufficient documentation

## 2021-02-26 DIAGNOSIS — E1159 Type 2 diabetes mellitus with other circulatory complications: Secondary | ICD-10-CM | POA: Diagnosis not present

## 2021-02-26 DIAGNOSIS — K3184 Gastroparesis: Secondary | ICD-10-CM

## 2021-02-26 DIAGNOSIS — I4891 Unspecified atrial fibrillation: Secondary | ICD-10-CM

## 2021-02-26 DIAGNOSIS — R062 Wheezing: Secondary | ICD-10-CM

## 2021-02-26 DIAGNOSIS — Z794 Long term (current) use of insulin: Secondary | ICD-10-CM

## 2021-02-26 LAB — URINALYSIS, ROUTINE W REFLEX MICROSCOPIC
Bilirubin, UA: NEGATIVE
Glucose, UA: NEGATIVE
Ketones, UA: NEGATIVE
Leukocytes,UA: NEGATIVE
Nitrite, UA: NEGATIVE
Specific Gravity, UA: 1.025 (ref 1.005–1.030)
Urobilinogen, Ur: 1 mg/dL (ref 0.2–1.0)
pH, UA: 5.5 (ref 5.0–7.5)

## 2021-02-26 LAB — MICROSCOPIC EXAMINATION
Bacteria, UA: NONE SEEN
Epithelial Cells (non renal): NONE SEEN /hpf (ref 0–10)
WBC, UA: NONE SEEN /hpf (ref 0–5)

## 2021-02-26 LAB — BAYER DCA HB A1C WAIVED: HB A1C (BAYER DCA - WAIVED): 7.1 % — ABNORMAL HIGH (ref <7.0)

## 2021-02-26 MED ORDER — ALBUTEROL SULFATE HFA 108 (90 BASE) MCG/ACT IN AERS
2.0000 | INHALATION_SPRAY | Freq: Four times a day (QID) | RESPIRATORY_TRACT | 1 refills | Status: DC | PRN
Start: 2021-02-26 — End: 2022-05-15

## 2021-02-26 MED ORDER — ATORVASTATIN CALCIUM 80 MG PO TABS: 80.0000 mg | ORAL_TABLET | Freq: Every day | ORAL | 1 refills | Status: DC

## 2021-02-26 MED ORDER — APIXABAN 5 MG PO TABS: 5.0000 mg | ORAL_TABLET | Freq: Two times a day (BID) | ORAL | 1 refills | Status: DC

## 2021-02-26 MED ORDER — SITAGLIPTIN PHOSPHATE 50 MG PO TABS: 50.0000 mg | ORAL_TABLET | Freq: Every day | ORAL | 3 refills | Status: DC

## 2021-02-26 MED ORDER — BACLOFEN 10 MG PO TABS: 10.0000 mg | ORAL_TABLET | Freq: Three times a day (TID) | ORAL | 1 refills | Status: DC

## 2021-02-26 MED ORDER — DAPAGLIFLOZIN PROPANEDIOL 10 MG PO TABS: 10.0000 mg | ORAL_TABLET | Freq: Every day | ORAL | 1 refills | Status: DC

## 2021-02-26 MED ORDER — METOPROLOL SUCCINATE ER 25 MG PO TB24: 25.0000 mg | ORAL_TABLET | Freq: Every day | ORAL | 3 refills | Status: DC

## 2021-02-26 MED ORDER — CLONIDINE HCL 0.1 MG PO TABS
0.1000 mg | ORAL_TABLET | Freq: Once | ORAL | Status: AC
  Administered 2021-02-26: 0.1 mg via ORAL

## 2021-02-26 NOTE — Telephone Encounter (Signed)
PA for Januvia has been approved through 02/26/21 to 02/26/22

## 2021-02-26 NOTE — Patient Instructions (Addendum)
Hypertension, Adult High blood pressure (hypertension) is when the force of blood pumping through the arteries is too strong. The arteries are the blood vessels that carry blood from the heart throughout the body. Hypertension forces the heart to work harder to pump blood and may cause arteries to become narrow or stiff. Untreated or uncontrolled hypertension can cause a heart attack, heart failure, a stroke, kidney disease, and otherproblems. A blood pressure reading consists of a higher number over a lower number. Ideally, your blood pressure should be below 120/80. The first ("top") number is called the systolic pressure. It is a measure of the pressure in your arteries as your heart beats. The second ("bottom") number is called the diastolic pressure. It is a measure of the pressure in your arteries as theheart relaxes. What are the causes? The exact cause of this condition is not known. There are some conditions thatresult in or are related to high blood pressure. What increases the risk? Some risk factors for high blood pressure are under your control. The following factors may make you more likely to develop this condition: Smoking. Having type 2 diabetes mellitus, high cholesterol, or both. Not getting enough exercise or physical activity. Being overweight. Having too much fat, sugar, calories, or salt (sodium) in your diet. Drinking too much alcohol. Some risk factors for high blood pressure may be difficult or impossible to change. Some of these factors include: Having chronic kidney disease. Having a family history of high blood pressure. Age. Risk increases with age. Race. You may be at higher risk if you are African American. Gender. Men are at higher risk than women before age 64. After age 64, women are at higher risk than men. Having obstructive sleep apnea. Stress. What are the signs or symptoms? High blood pressure may not cause symptoms. Very high blood pressure  (hypertensive crisis) may cause: Headache. Anxiety. Shortness of breath. Nosebleed. Nausea and vomiting. Vision changes. Severe chest pain. Seizures. How is this diagnosed? This condition is diagnosed by measuring your blood pressure while you are seated, with your arm resting on a flat surface, your legs uncrossed, and your feet flat on the floor. The cuff of the blood pressure monitor will be placed directly against the skin of your upper arm at the level of your heart. It should be measured at least twice using the same arm. Certain conditions cancause a difference in blood pressure between your right and left arms. Certain factors can cause blood pressure readings to be lower or higher than normal for a short period of time: When your blood pressure is higher when you are in a health care provider's office than when you are at home, this is called white coat hypertension. Most people with this condition do not need medicines. When your blood pressure is higher at home than when you are in a health care provider's office, this is called masked hypertension. Most people with this condition may need medicines to control blood pressure. If you have a high blood pressure reading during one visit or you have normal blood pressure with other risk factors, you may be asked to: Return on a different day to have your blood pressure checked again. Monitor your blood pressure at home for 1 week or longer. If you are diagnosed with hypertension, you may have other blood or imaging tests to help your health care provider understand your overall risk for otherconditions. How is this treated? This condition is treated by making healthy lifestyle changes, such as  eating healthy foods, exercising more, and reducing your alcohol intake. Your health care provider may prescribe medicine if lifestyle changes are not enough to get your blood pressure under control, and if: Your systolic blood pressure is above  130. Your diastolic blood pressure is above 80. Your personal target blood pressure may vary depending on your medicalconditions, your age, and other factors. Follow these instructions at home: Eating and drinking  Eat a diet that is high in fiber and potassium, and low in sodium, added sugar, and fat. An example eating plan is called the DASH (Dietary Approaches to Stop Hypertension) diet. To eat this way: Eat plenty of fresh fruits and vegetables. Try to fill one half of your plate at each meal with fruits and vegetables. Eat whole grains, such as whole-wheat pasta, brown rice, or whole-grain bread. Fill about one fourth of your plate with whole grains. Eat or drink low-fat dairy products, such as skim milk or low-fat yogurt. Avoid fatty cuts of meat, processed or cured meats, and poultry with skin. Fill about one fourth of your plate with lean proteins, such as fish, chicken without skin, beans, eggs, or tofu. Avoid pre-made and processed foods. These tend to be higher in sodium, added sugar, and fat. Reduce your daily sodium intake. Most people with hypertension should eat less than 1,500 mg of sodium a day. Do not drink alcohol if: Your health care provider tells you not to drink. You are pregnant, may be pregnant, or are planning to become pregnant. If you drink alcohol: Limit how much you use to: 0-1 drink a day for women. 0-2 drinks a day for men. Be aware of how much alcohol is in your drink. In the [place] one drink equals one 12 oz bottle of beer (355 mL), one 5 oz glass of wine (148 mL), or one 1 oz glass of hard liquor (44 mL).  Lifestyle  Work with your health care provider to maintain a healthy body weight or to lose weight. Ask what an ideal weight is for you. Get at least 30 minutes of exercise most days of the week. Activities may include walking, swimming, or biking. Include exercise to strengthen your muscles (resistance exercise), such as Pilates or lifting weights, as  part of your weekly exercise routine. Try to do these types of exercises for 30 minutes at least 3 days a week. Do not use any products that contain nicotine or tobacco, such as cigarettes, e-cigarettes, and chewing tobacco. If you need help quitting, ask your health care provider. Monitor your blood pressure at home as told by your health care provider. Keep all follow-up visits as told by your health care provider. This is important.  Medicines Take over-the-counter and prescription medicines only as told by your health care provider. Follow directions carefully. Blood pressure medicines must be taken as prescribed. Do not skip doses of blood pressure medicine. Doing this puts you at risk for problems and can make the medicine less effective. Ask your health care provider about side effects or reactions to medicines that you should watch for. Contact a health care provider if you: Think you are having a reaction to a medicine you are taking. Have headaches that keep coming back (recurring). Feel dizzy. Have swelling in your ankles. Have trouble with your vision. Get help right away if you: Develop a severe headache or confusion. Have unusual weakness or numbness. Feel faint. Have severe pain in your chest or abdomen. Vomit repeatedly. Have trouble breathing. Summary Hypertension is  when the force of blood pumping through your arteries is too strong. If this condition is not controlled, it may put you at risk for serious complications. Your personal target blood pressure may vary depending on your medical conditions, your age, and other factors. For most people, a normal blood pressure is less than 120/80. Hypertension is treated with lifestyle changes, medicines, or a combination of both. Lifestyle changes include losing weight, eating a healthy, low-sodium diet, exercising more, and limiting alcohol. This information is not intended to replace advice given to you by your health care  provider. Make sure you discuss any questions you have with your healthcare provider. Document Revised: 04/13/2018 Document Reviewed: 04/13/2018 Elsevier Patient Education  2022 [employer] Diabetes Mellitus Action Plan Following a diabetes action plan is a way for you to manage your diabetes (diabetes mellitus) symptoms. The plan is color-coded to help you understand what actions you need to take based on any symptoms you are having. If you have symptoms in the red zone, you need medical care right away. If you have symptoms in the yellow zone, you are having problems. If you have symptoms in the green zone, you are doing well. Learning about and understanding diabetes can take time. Follow the plan that you develop with your health care provider. Know the target range for your blood sugar (glucose) level, and review your treatment plan with your health care provider at eachvisit. The target range for my blood sugar level is __________________________ mg/dL. Red zone Get medical help right away if you have any of the following symptoms: A blood sugar test result that is below 54 mg/dL (3 mmol/L). A blood sugar test result that is at or above 240 mg/dL (40.9 mmol/L) for 2 days in a row. Confusion or trouble thinking clearly. Difficulty breathing. Sickness or a fever for 2 or more days that is not getting better. Moderate or large ketone levels in your urine. Feeling tired or having no energy. If you have any red zone symptoms, do not wait to see if the symptoms will go away. Get medical help right away. Call your local emergency services (911 in the [place] Do not drive yourself to the hospital. If you have severely low blood sugar (severe hypoglycemia) and you cannot eat or drink, you may need glucagon. Make sure a family member or close friend knows how to check your blood sugar and how to give youglucagon. You may need to be treated in a hospital for this condition. Yellow zone If you have  any of the following symptoms, your diabetes is not under control and you may need to make some changes: A blood sugar test result that is at or above 240 mg/dL (81.1 mmol/L) for 2 days in a row. Blood sugar test results that are below 70 mg/dL (3.9 mmol/L). Other symptoms of hypoglycemia, such as: Shaking or feeling light-headed. Confusion or irritability. Feeling hungry. Having a fast heartbeat. If you have any yellow zone symptoms: Treat your hypoglycemia by eating or drinking 15 grams of a rapid-acting carbohydrate. Follow the 15:15 rule: Take 15 grams of a rapid-acting carbohydrate, such as: 1 tube of glucose gel. 4 glucose pills. 4 oz (120 mL) of fruit juice. 4 oz (120 mL) of regular (not diet) soda. Check your blood sugar 15 minutes after you take the carbohydrate. If the repeat blood sugar test is still at or below 70 mg/dL (3.9 mmol/L), take 15 grams of a carbohydrate again. If your blood sugar does not increase  above 70 mg/dL (3.9 mmol/L) after 3 tries, get medical help right away. After your blood sugar returns to normal, eat a meal or a snack within 1 hour. Keep taking your daily medicines as told by your health care provider. Check your blood sugar more often than you normally would. Write down your results. Call your health care provider if you have trouble keeping your blood sugar in your target range.  Green zone These signs mean you are doing well and you can continue what you are doing to manage your diabetes: Your blood sugar is within your personal target range. For most people, a blood sugar level before a meal (preprandial) should be 80-130 mg/dL (8.5-8.8 mmol/L). You feel well, and you are able to do daily activities. If you are in the green zone, continue to manage your diabetes as told by your health care provider. To do this: Eat a healthy diet. Exercise regularly. Check your blood sugar as told by your health care provider. Take your medicines as told by  your health care provider.  Where to find more information American Diabetes Association (ADA): diabetes.org Association of Diabetes Care & Education Specialists (ADCES): diabeteseducator.org Summary Following a diabetes action plan is a way for you to manage your diabetes symptoms. The plan is color-coded to help you understand what actions you need to take based on any symptoms you are having. Follow the plan that you develop with your health care provider. Make sure you know your personal target blood sugar level. Review your treatment plan with your health care provider at each visit. This information is not intended to replace advice given to you by your health care provider. Make sure you discuss any questions you have with your healthcare provider. Document Revised: 02/08/2020 Document Reviewed: 02/08/2020 Elsevier Patient Education  2022 [employer].

## 2021-02-26 NOTE — Progress Notes (Signed)
BP (!) 196/102   Pulse 61   Temp 97.7 F (36.5 C) (Oral)   Ht 5' 5.55" (1.665 m)   Wt 161 lb 3.2 oz (73.1 kg)   SpO2 97%   BMI 26.38 kg/m    Subjective:    Patient ID: Evan Cones Sr., male    DOB: [birth year], 65 y.o.   MRN: [id number]  Chief Complaint  Patient presents with   New Patient (Initial Visit)   h/o recurrent strokes    Last stroke 3 yrs ago, has h/o of 3 strokes   Diabetes   Hypertension   Hyperlipidemia   Chronic Kidney Disease   edema   microalburminia   Dysphagia    Has been constantly choked when he eats or drinks. Started about 3 months ago     HPI: Evan Townsend. is a 65 y.o. male  Diabetes He presents for his follow-up diabetic visit. He has type 2 diabetes mellitus. Pertinent negatives for hypoglycemia include no headaches or sweats. Pertinent negatives for diabetes include no blurred vision and no chest pain.  Hypertension This is a chronic problem. The current episode started more than 1 year ago. Pertinent negatives include no anxiety, blurred vision, chest pain, headaches, malaise/fatigue, neck pain, orthopnea, palpitations, peripheral edema, PND, shortness of breath or sweats.  Hyperlipidemia Pertinent negatives include no chest pain or shortness of breath.   Chief Complaint  Patient presents with   New Patient (Initial Visit)   h/o recurrent strokes    Last stroke 3 yrs ago, has h/o of 3 strokes   Diabetes   Hypertension   Hyperlipidemia   Chronic Kidney Disease   edema   microalburminia   Dysphagia    Has been constantly choked when he eats or drinks. Started about 3 months ago     Relevant past medical, surgical, family and social history reviewed and updated as indicated. Interim medical history since our last visit reviewed. Allergies and medications reviewed and updated.  Review of Systems  Constitutional:  Negative for malaise/fatigue.  Eyes:  Negative for blurred vision.  Respiratory:  Negative for shortness of  breath.   Cardiovascular:  Negative for chest pain, palpitations, orthopnea and PND.  Musculoskeletal:  Negative for neck pain.  Neurological:  Negative for headaches.   Per HPI unless specifically indicated above     Objective:    BP (!) 196/102   Pulse 61   Temp 97.7 F (36.5 C) (Oral)   Ht 5' 5.55" (1.665 m)   Wt 161 lb 3.2 oz (73.1 kg)   SpO2 97%   BMI 26.38 kg/m   Wt Readings from Last 3 Encounters:  02/26/21 161 lb 3.2 oz (73.1 kg)  12/24/20 165 lb 1.6 oz (74.9 kg)  10/28/20 162 lb 4.8 oz (73.6 kg)    Physical Exam  Results for orders placed or performed in visit on 12/24/20  POCT HgB A1C  Result Value Ref Range   Hemoglobin A1C 7.4 (A) 4.0 - 5.6 %   HbA1c POC (<> result, manual entry)     HbA1c, POC (prediabetic range)     HbA1c, POC (controlled diabetic range)          Current Outpatient Medications:    ACCU-CHEK GUIDE test strip, Check fsbs once daily, Disp: 100 each, Rfl: 2   Accu-Chek Softclix Lancets lancets, SMARTSIG:Topical, Disp: 100 each, Rfl: 1   albuterol (VENTOLIN HFA) 108 (90 Base) MCG/ACT inhaler, Inhale 2 puffs into the lungs every 6 (six) hours  as needed for wheezing or shortness of breath., Disp: 8 g, Rfl: 1   Blood Glucose Monitoring Suppl (FIFTY50 GLUCOSE METER 2.0) w/Device KIT, Use as instructed, Disp: , Rfl:    Blood Glucose Monitoring Suppl KIT, Accucheck Guide with lancets and test strips #100 with one refill.  Test blood sugar twice daily. DX Code E11.65 ; Z79.4, Disp: 1 kit, Rfl: 0   Continuous Blood Gluc Receiver (FREESTYLE LIBRE 2 READER) DEVI, 1 each by Does not apply route daily., Disp: 1 each, Rfl: 0   Continuous Blood Gluc Sensor (FREESTYLE LIBRE 2 SENSOR) MISC, 1 each by Does not apply route every 14 (fourteen) days., Disp: 6 each, Rfl: 1   Insulin Pen Needle 32G X 6 MM MISC, Daily, Disp: 100 each, Rfl: 3   lisinopril (ZESTRIL) 40 MG tablet, Take 1 tablet (40 mg total) by mouth daily., Disp: 90 tablet, Rfl: 3   metoCLOPramide  (REGLAN) 5 MG tablet, Take 1 tablet (5 mg total) by mouth 3 (three) times daily before meals., Disp: 90 tablet, Rfl: 2   metoprolol succinate (TOPROL-XL) 25 MG 24 hr tablet, Take 1 tablet (25 mg total) by mouth daily., Disp: 30 tablet, Rfl: 3   Misc. Devices MISC, One pair of Compression stockings  Hemiplegia and hemiparesis following cerebral infarction affecting left non-dominant side ([place])  - Primary Codes: Y18.563 Lower extremity edema  Codes: R60.0 Type 2 diabetes mellitus with hyperglycemia, with long-term current use of insulin (HCC)  Codes: E11.65, Z79.4, Disp: 1 Units, Rfl: 0   omeprazole (PRILOSEC) 40 MG capsule, Take 1 capsule (40 mg total) by mouth in the morning and at bedtime., Disp: 90 capsule, Rfl: 1   sitaGLIPtin (JANUVIA) 50 MG tablet, Take 1 tablet (50 mg total) by mouth daily., Disp: 30 tablet, Rfl: 3   apixaban (ELIQUIS) 5 MG TABS tablet, Take 1 tablet (5 mg total) by mouth 2 (two) times daily., Disp: 60 tablet, Rfl: 1   atorvastatin (LIPITOR) 80 MG tablet, Take 1 tablet (80 mg total) by mouth daily., Disp: 90 tablet, Rfl: 1   baclofen (LIORESAL) 10 MG tablet, Take 1 tablet (10 mg total) by mouth 3 (three) times daily., Disp: 90 each, Rfl: 1   dapagliflozin propanediol (FARXIGA) 10 MG TABS tablet, Take 1 tablet (10 mg total) by mouth daily before breakfast. In place of glipizide, Disp: 90 tablet, Rfl: 1   DULoxetine (CYMBALTA) 30 MG capsule, Take 1 capsule (30 mg total) by mouth daily. (Patient not taking: Reported on 02/26/2021), Disp: 30 capsule, Rfl: 0   Glucagon (GVOKE HYPOPEN 1-PACK) 1 MG/0.2ML SOAJ, Inject 1 each into the skin daily as needed. (Patient not taking: Reported on 02/26/2021), Disp: 0.2 mL, Rfl: 2    Assessment & Plan:  HTN  Clonidine 0.1 mg x 1 given to pt STAT  Recheck change hctz to meotpolol sec to pt having a hard time to pee sec to this diuretic   2. DM : Continue farxiga Stop insulin as last a1c at 7.4 Start pt on januvia  Consider ozempic if a1c  comes back higher   3. Diabetic gstroparesis ? Is on reglan for such   4. GERD is on omeprazole for such 45   5. Left sided hemiplegia  Walking with a walker Getting PT was 2 days a week.  Pts wife is a home health aide Lucianne Lei needs to increase PT per their recs,  Will get PT eval and tx.   5. Depression resolved per pt, will need to d/c cymbalata  rx last month, couldn't tolerate it.    6. Atrial fibrillation  Stable, add metoprolol fu with cardiollgy  ? Still has such  Is on eliquis bid for such will need to be evaluated.    7. Wheezing ?  Ho smoking x 1 ppd x 10 yrs. Stopped x last yeaar Problem List Items Addressed This Visit       Endocrine   Type 2 diabetes mellitus with hyperglycemia ([place])   Other Visit Diagnoses     Type 2 diabetes mellitus with other circulatory complication, with long-term current use of insulin ([place])       Moderate major depression (HCC)       Muscle spasm of left lower extremity            No orders of the defined types were placed in this encounter.    Meds ordered this encounter  Medications   apixaban (ELIQUIS) 5 MG TABS tablet    Sig: Take 1 tablet (5 mg total) by mouth 2 (two) times daily.    Dispense:  60 tablet    Refill:  1   dapagliflozin propanediol (FARXIGA) 10 MG TABS tablet    Sig: Take 1 tablet (10 mg total) by mouth daily before breakfast. In place of glipizide    Dispense:  90 tablet    Refill:  1   baclofen (LIORESAL) 10 MG tablet    Sig: Take 1 tablet (10 mg total) by mouth 3 (three) times daily.    Dispense:  90 each    Refill:  1   atorvastatin (LIPITOR) 80 MG tablet    Sig: Take 1 tablet (80 mg total) by mouth daily.    Dispense:  90 tablet    Refill:  1   metoprolol succinate (TOPROL-XL) 25 MG 24 hr tablet    Sig: Take 1 tablet (25 mg total) by mouth daily.    Dispense:  30 tablet    Refill:  3   sitaGLIPtin (JANUVIA) 50 MG tablet    Sig: Take 1 tablet (50 mg total) by mouth daily.    Dispense:  30  tablet    Refill:  3   albuterol (VENTOLIN HFA) 108 (90 Base) MCG/ACT inhaler    Sig: Inhale 2 puffs into the lungs every 6 (six) hours as needed for wheezing or shortness of breath.    Dispense:  8 g    Refill:  1     Follow up plan: No follow-ups on file.

## 2021-02-26 NOTE — Telephone Encounter (Signed)
PA for Januvia 50mg  has been started through Cover my meds, awaiting on determination

## 2021-02-27 ENCOUNTER — Telehealth: Payer: Self-pay

## 2021-02-27 LAB — CBC WITH DIFFERENTIAL/PLATELET
Basophils Absolute: 0 10*3/uL (ref 0.0–0.2)
Basos: 1 %
EOS (ABSOLUTE): 0.1 10*3/uL (ref 0.0–0.4)
Eos: 3 %
Hematocrit: 34 % — ABNORMAL LOW (ref 37.5–51.0)
Hemoglobin: 11.8 g/dL — ABNORMAL LOW (ref 13.0–17.7)
Immature Grans (Abs): 0 10*3/uL (ref 0.0–0.1)
Immature Granulocytes: 0 %
Lymphocytes Absolute: 2.1 10*3/uL (ref 0.7–3.1)
Lymphs: 49 %
MCH: 30.2 pg (ref 26.6–33.0)
MCHC: 34.7 g/dL (ref 31.5–35.7)
MCV: 87 fL (ref 79–97)
Monocytes Absolute: 0.4 10*3/uL (ref 0.1–0.9)
Monocytes: 10 %
Neutrophils Absolute: 1.6 10*3/uL (ref 1.4–7.0)
Neutrophils: 37 %
Platelets: 296 10*3/uL (ref 150–450)
RBC: 3.91 x10E6/uL — ABNORMAL LOW (ref 4.14–5.80)
RDW: 15 % (ref 11.6–15.4)
WBC: 4.3 10*3/uL (ref 3.4–10.8)

## 2021-02-27 LAB — COMPREHENSIVE METABOLIC PANEL
ALT: 24 IU/L (ref 0–44)
AST: 18 IU/L (ref 0–40)
Albumin/Globulin Ratio: 1.4 (ref 1.2–2.2)
Albumin: 4.4 g/dL (ref 3.8–4.8)
Alkaline Phosphatase: 202 IU/L — ABNORMAL HIGH (ref 44–121)
BUN/Creatinine Ratio: 20 (ref 10–24)
BUN: 26 mg/dL (ref 8–27)
Bilirubin Total: <0.2 mg/dL (ref 0.0–1.2)
CO2: 20 mmol/L (ref 20–29)
Calcium: 10 mg/dL (ref 8.6–10.2)
Chloride: 102 mmol/L (ref 96–106)
Creatinine, Ser: 1.29 mg/dL — ABNORMAL HIGH (ref 0.76–1.27)
Globulin, Total: 3.2 g/dL (ref 1.5–4.5)
Glucose: 123 mg/dL — ABNORMAL HIGH (ref 65–99)
Potassium: 4.7 mmol/L (ref 3.5–5.2)
Sodium: 137 mmol/L (ref 134–144)
Total Protein: 7.6 g/dL (ref 6.0–8.5)
eGFR: 62 mL/min/{1.73_m2} (ref >59)

## 2021-02-27 LAB — LIPID PANEL
Chol/HDL Ratio: 4.4 ratio (ref 0.0–5.0)
Cholesterol, Total: 231 mg/dL — ABNORMAL HIGH (ref 100–199)
HDL: 52 mg/dL (ref >39)
LDL Chol Calc (NIH): 148 mg/dL — ABNORMAL HIGH (ref 0–99)
Triglycerides: 170 mg/dL — ABNORMAL HIGH (ref 0–149)
VLDL Cholesterol Cal: 31 mg/dL (ref 5–40)

## 2021-02-27 LAB — TSH: TSH: 0.626 u[IU]/mL (ref 0.450–4.500)

## 2021-02-27 LAB — PSA: Prostate Specific Ag, Serum: 2.3 ng/mL (ref 0.0–4.0)

## 2021-02-27 NOTE — Telephone Encounter (Signed)
PA started for Januvia 50mg  through cover my meds, awaiting on determination

## 2021-02-27 NOTE — Progress Notes (Signed)
I am adding zetia then, if he is already taiking 80 of lipitor and LDL still high needs a second agent given DM and ho multiple strokes. Pl let them know thnx.

## 2021-02-27 NOTE — Progress Notes (Signed)
Please let pt know his LDL was very high and check if he does take his lipitor 80 - if so need to add a new agent given his DM. needs Lifestyle modifications low fat high fiber please    Thnx.

## 2021-02-28 ENCOUNTER — Telehealth: Payer: Self-pay | Admitting: Internal Medicine

## 2021-02-28 NOTE — Telephone Encounter (Signed)
Pt wife Haeden Hudock (wife) [phone] calling to ask if Dr. Charlotta Newton nurse can call [facility] to finalize which medications that the pt is taking. Some medications has been changed and replaced. Please advise with the pharmacy. Is the patient still on levemir?

## 2021-02-28 NOTE — Telephone Encounter (Signed)
Please assist

## 2021-03-03 ENCOUNTER — Telehealth: Payer: Self-pay

## 2021-03-03 NOTE — Telephone Encounter (Signed)
PA for Januvia 50mg  has been approved from 02/26/21 through 02/26/22

## 2021-03-03 NOTE — Telephone Encounter (Signed)
Can we please call and see what the patient needs thnx.

## 2021-03-03 NOTE — Telephone Encounter (Signed)
PA for Januvia 50mg  was approved. Wife will be picking up

## 2021-03-05 ENCOUNTER — Encounter: Payer: Medicaid Other | Admitting: Physical Therapy

## 2021-03-07 ENCOUNTER — Ambulatory Visit (INDEPENDENT_AMBULATORY_CARE_PROVIDER_SITE_OTHER): Payer: Medicaid Other | Admitting: Nurse Practitioner

## 2021-03-07 ENCOUNTER — Encounter: Payer: Self-pay | Admitting: Nurse Practitioner

## 2021-03-07 VITALS — BP 112/72 | HR 52 | Ht 65.0 in | Wt 155.0 lb

## 2021-03-07 DIAGNOSIS — E785 Hyperlipidemia, unspecified: Secondary | ICD-10-CM | POA: Diagnosis not present

## 2021-03-07 DIAGNOSIS — I5189 Other ill-defined heart diseases: Secondary | ICD-10-CM

## 2021-03-07 DIAGNOSIS — I480 Paroxysmal atrial fibrillation: Secondary | ICD-10-CM | POA: Diagnosis not present

## 2021-03-07 DIAGNOSIS — I10 Essential (primary) hypertension: Secondary | ICD-10-CM

## 2021-03-07 NOTE — Patient Instructions (Signed)
Medication Instructions:  No changes at this time.  *If you need a refill on your cardiac medications before your next appointment, please call your pharmacy*   Lab Work: None  If you have labs (blood work) drawn today and your tests are completely normal, you will receive your results only by: MyChart Message (if you have MyChart) OR A paper copy in the mail If you have any lab test that is abnormal or we need to change your treatment, we will call you to review the results.   Testing/Procedures: None   Follow-Up: At [facility], you and your health needs are our priority.  As part of our continuing mission to provide you with exceptional heart care, we have created designated Provider Care Teams.  These Care Teams include your primary Cardiologist (physician) and Advanced Practice Providers (APPs -  Physician Assistants and Nurse Practitioners) who all work together to provide you with the care you need, when you need it.  We recommend signing up for the patient portal called "MyChart".  Sign up information is provided on this After Visit Summary.  MyChart is used to connect with patients for Virtual Visits (Telemedicine).  Patients are able to view lab/test results, encounter notes, upcoming appointments, etc.  Non-urgent messages can be sent to your provider as well.   To learn more about what you can do with MyChart, go to [email or web address].    Your next appointment:   6 month(s)  The format for your next appointment:   In Person  Provider:   Christopher End, MD or Christopher Berge, NP   

## 2021-03-07 NOTE — Progress Notes (Signed)
Office Visit    Patient Name: Evan Townsend. Date of Encounter: 03/07/2021  Primary Care Provider:  Charlynne Cousins, MD Primary Cardiologist:  Nelva Bush, MD  Chief Complaint    65 year old male with a history of strokes, left hemiparesis, paroxysmal atrial fibrillation, hypertension, hyperlipidemia, poorly controlled diabetes, cerebrovascular and carotid disease, and tobacco abuse, who presents for follow-up of atrial fibrillation.  Past Medical History    Past Medical History:  Diagnosis Date   Carotid arterial disease ([place])    a. 08/2018 Carotid U/S: min-mod RICA atherosclerosis w/o hemodynamically significant stenosis. Nl LICA.   Diabetes 1.5, managed as type 2 ([place])    Diastolic dysfunction    a. 08/2018 Echo: EF 65%. No rwma. Gr1 DD. Mild MR.   Diastolic dysfunction    a. 08/2019 Echo: EF 55-60%, Gr1 DD. No rwma. Mild MR. RVSP 37.29mHg.   Hypercholesterolemia    Hypertension    PAF (paroxysmal atrial fibrillation) ([place]    a. 10/2019 Event Monitor: PAF; b. CHA2DS2VASc = 5-->Eliquis.   Poorly controlled diabetes mellitus ([place]    a. 04/2019 A1c 13.8.   Recurrent strokes ([place]    a. 10/2016 MRI/A: Acute 543mR thalamic infarct, ? subacute infarct of R corona radiata; b. 08/2017 MRI/A: Acute 73m273materal L thalamic infarct. Other more remote lacunar infarcts of thalami bilat. Small vessel dzs; c. 08/2018 MRI/A: Acute lacunar infarct of the post limb of R internal capsule; d. 08/2019 MRI Acute CVA of L paramedian pons adn R cerebellar hemisphere.   Tobacco abuse    Past Surgical History:  Procedure Laterality Date   ESOPHAGOGASTRODUODENOSCOPY (EGD) WITH PROPOFOL N/A 04/26/2019   Procedure: ESOPHAGOGASTRODUODENOSCOPY (EGD) WITH PROPOFOL;  Surgeon: VanLin LandsmanD;  [facility];  Service: Gastroenterology;  Laterality: N/A;    Allergies  No Known Allergies  History of Present Illness    [age]ar old male with the above complex past medical history  including recurrent strokes, chronic left hemiparesis, paroxysmal atrial fibrillation, hypertension, hyperlipidemia, poorly controlled diabetes, cerebrovascular and carotid disease, and tobacco abuse.  [facility].  Echocardiogram showed normal LV function.  Stress testing was nonischemic.  The day after discharge, [facility] to dysarthria, left facial droop, and worsening left-sided weakness.  MRI showed acute stroke in the left paramedian pons and right cerebellar hemisphere.  He subsequently underwent outpatient monitoring which showed paroxysmal atrial fibrillation and he was placed on Eliquis therapy in March 2021.  He was last seen in cardiology clinic in February 2022, at which time he was doing well.  Since then, he has continued to do well.  His wife is present with him today.  He is relatively sedentary and she feels like he is not walking enough.  He does not experience chest pain, dyspnea, palpitations, PND, orthopnea, dizziness, syncope, edema, or early satiety.  Sometimes he notes left calf cramping while sitting but this seems to work itself out with rubbing it or movement.  Note that he recently had labs on July 13.  His LDL was elevated at 148.  Upon questioning, his wife says that he had been out of Lipitor for a few months.  He has otherwise been compliant with his home medications.  Home Medications    Current Outpatient Medications  Medication Sig Dispense Refill   ACCU-CHEK GUIDE test strip Check fsbs once daily 100 each 2   Accu-Chek Softclix Lancets lancets  SMARTSIG:Topical 100 each 1   albuterol (VENTOLIN HFA) 108 (90 Base) MCG/ACT inhaler Inhale 2 puffs into the lungs every 6 (six) hours as needed for wheezing or shortness of breath. 8 g 1   apixaban (ELIQUIS) 5 MG TABS tablet Take 1 tablet (5 mg total) by mouth 2 (two) times daily. 60 tablet 1   atorvastatin (LIPITOR) 80 MG  tablet Take 1 tablet (80 mg total) by mouth daily. 90 tablet 1   baclofen (LIORESAL) 10 MG tablet Take 1 tablet (10 mg total) by mouth 3 (three) times daily. 90 each 1   Blood Glucose Monitoring Suppl (FIFTY50 GLUCOSE METER 2.0) w/Device KIT Use as instructed     Blood Glucose Monitoring Suppl KIT Accucheck Guide with lancets and test strips #100 with one refill.  Test blood sugar twice daily. DX Code E11.65 ; Z79.4 1 kit 0   Continuous Blood Gluc Receiver (FREESTYLE LIBRE 2 READER) DEVI 1 each by Does not apply route daily. 1 each 0   Continuous Blood Gluc Sensor (FREESTYLE LIBRE 2 SENSOR) MISC 1 each by Does not apply route every 14 (fourteen) days. 6 each 1   dapagliflozin propanediol (FARXIGA) 10 MG TABS tablet Take 1 tablet (10 mg total) by mouth daily before breakfast. In place of glipizide 90 tablet 1   Insulin Pen Needle 32G X 6 MM MISC Daily 100 each 3   lisinopril (ZESTRIL) 40 MG tablet Take 1 tablet (40 mg total) by mouth daily. 90 tablet 3   metoCLOPramide (REGLAN) 5 MG tablet Take 1 tablet (5 mg total) by mouth 3 (three) times daily before meals. 90 tablet 2   metoprolol succinate (TOPROL-XL) 25 MG 24 hr tablet Take 1 tablet (25 mg total) by mouth daily. 30 tablet 3   Misc. Devices MISC One pair of Compression stockings  Hemiplegia and hemiparesis following cerebral infarction affecting left non-dominant side (HCC)  - Primary Codes: A54.098 Lower extremity edema  Codes: R60.0 Type 2 diabetes mellitus with hyperglycemia, with long-term current use of insulin (HCC)  Codes: E11.65, Z79.4 1 Units 0   omeprazole (PRILOSEC) 40 MG capsule Take 1 capsule (40 mg total) by mouth in the morning and at bedtime. 90 capsule 1   sitaGLIPtin (JANUVIA) 50 MG tablet Take 1 tablet (50 mg total) by mouth daily. 30 tablet 3   No current facility-administered medications for this visit.     Review of Systems    He denies chest pain, palpitations, dyspnea, pnd, orthopnea, n, v, dizziness, syncope,  edema, weight gain, or early satiety.  All other systems reviewed and are otherwise negative except as noted above.  Physical Exam    VS:  BP 112/72 (BP Location: Right Arm, Patient Position: Sitting, Cuff Size: Normal)   Pulse (!) 52   Ht _0  (1.651 m)   Wt 155 lb (70.3 kg)   SpO2 98%   BMI 25.79 kg/m  , BMI Body mass index is 25.79 kg/m.     GEN: Well nourished, well developed, in no acute distress. HEENT: normal. Neck: Supple, no JVD, carotid bruits, or masses. Cardiac: RRR, no murmurs, rubs, or gallops. No clubbing, cyanosis, edema.  Radials/PT 2+ and equal bilaterally.  Respiratory:  Respirations regular and unlabored, clear to auscultation bilaterally. GI: Soft, nontender, nondistended, BS + x 4. MS: no deformity or atrophy. Skin: warm and dry, no rash. Neuro: Dysarthria.  Left hemiparesis. Psych: Normal affect.  Accessory Clinical Findings    ECG personally reviewed by me today -sinus bradycardia, 52,  left axis deviation, right bundle branch block, inferior infarct- no acute changes.  Lab Results  Component Value Date   WBC 4.3 02/26/2021   HGB 11.8 (L) 02/26/2021   HCT 34.0 (L) 02/26/2021   MCV 87 02/26/2021   PLT 296 02/26/2021   Lab Results  Component Value Date   CREATININE 1.29 (H) 02/26/2021   BUN 26 02/26/2021   NA 137 02/26/2021   K 4.7 02/26/2021   CL 102 02/26/2021   CO2 20 02/26/2021   Lab Results  Component Value Date   ALT 24 02/26/2021   AST 18 02/26/2021   ALKPHOS 202 (H) 02/26/2021   BILITOT <0.2 02/26/2021   Lab Results  Component Value Date   CHOL 231 (H) 02/26/2021   HDL 52 02/26/2021   LDLCALC 148 (H) 02/26/2021   TRIG 170 (H) 02/26/2021   CHOLHDL 4.4 02/26/2021    Lab Results  Component Value Date   HGBA1C 7.1 (H) 02/26/2021    Assessment & Plan    1.  Paroxysmal atrial fibrillation: Maintaining sinus rhythm.  He denies any recent palpitations.  He remains on beta-blocker and Eliquis therapy.  Recent labs with stable  blood counts and renal function.  2.  Essential hypertension: Stable on beta-blocker and ACE inhibitor therapy.  3.  Diastolic dysfunction: Euvolemic on examination.  Heart rate and blood pressure well controlled.  4.  Hyperlipidemia: LDL previously 65 in June 2021.  He recently had lipids with an LDL of 148 on July 13.  On questioning, his wife notes that he had been out of Lipitor for some period of time.  She thought it was only 5 weeks but I suspect its been longer.  This was recently refilled on July 13 however.  5.  Diabetes mellitus: Followed by primary care.  A1c 7.1 last week.  6.  CVA: Known paroxysmal A. fib on chronic anticoagulation.  He has been stable.  7.  Disposition: Stressed importance of medication compliance.  Follow-up in 6 months or sooner if necessary.   Murray Hodgkins, NP 03/07/2021, 2:43 PM

## 2021-03-20 ENCOUNTER — Other Ambulatory Visit: Payer: Self-pay

## 2021-03-20 ENCOUNTER — Encounter: Payer: Self-pay | Admitting: Internal Medicine

## 2021-03-20 ENCOUNTER — Ambulatory Visit: Payer: Medicaid Other | Admitting: Internal Medicine

## 2021-03-20 ENCOUNTER — Ambulatory Visit: Payer: Medicaid Other | Admitting: Podiatry

## 2021-03-20 VITALS — BP 121/81 | HR 60 | Temp 97.5°F | Ht 64.96 in | Wt 157.0 lb

## 2021-03-20 DIAGNOSIS — Z794 Long term (current) use of insulin: Secondary | ICD-10-CM

## 2021-03-20 DIAGNOSIS — M62838 Other muscle spasm: Secondary | ICD-10-CM

## 2021-03-20 DIAGNOSIS — I639 Cerebral infarction, unspecified: Secondary | ICD-10-CM | POA: Diagnosis not present

## 2021-03-20 DIAGNOSIS — I4891 Unspecified atrial fibrillation: Secondary | ICD-10-CM | POA: Diagnosis not present

## 2021-03-20 DIAGNOSIS — R748 Abnormal levels of other serum enzymes: Secondary | ICD-10-CM | POA: Diagnosis not present

## 2021-03-20 DIAGNOSIS — E1159 Type 2 diabetes mellitus with other circulatory complications: Secondary | ICD-10-CM | POA: Diagnosis not present

## 2021-03-20 MED ORDER — ATORVASTATIN CALCIUM 80 MG PO TABS: 80.0000 mg | ORAL_TABLET | Freq: Every day | ORAL | 1 refills | Status: DC

## 2021-03-20 MED ORDER — BACLOFEN 10 MG PO TABS: 10.0000 mg | ORAL_TABLET | Freq: Three times a day (TID) | ORAL | 1 refills | Status: DC

## 2021-03-20 MED ORDER — APIXABAN 5 MG PO TABS: 5.0000 mg | ORAL_TABLET | Freq: Two times a day (BID) | ORAL | 1 refills | Status: DC

## 2021-03-20 MED ORDER — DAPAGLIFLOZIN PROPANEDIOL 10 MG PO TABS: 10.0000 mg | ORAL_TABLET | Freq: Every day | ORAL | 1 refills | Status: DC

## 2021-03-20 NOTE — Patient Instructions (Signed)
Dyslipidemia Dyslipidemia is an imbalance of waxy, fat-like substances (lipids) in the blood. The body needs lipids in small amounts. Dyslipidemia often involves a high level of cholesterol or triglycerides, which are types oflipids. Common forms of dyslipidemia include: High levels of LDL cholesterol. LDL is the type of cholesterol that causes fatty deposits (plaques) to build up in the blood vessels that carry blood away from your heart (arteries). Low levels of HDL cholesterol. HDL cholesterol is the type of cholesterol that protects against heart disease. High levels of HDL remove the LDL buildup from arteries. High levels of triglycerides. Triglycerides are a fatty substance in the blood that is linked to a buildup of plaques in the arteries. What are the causes? Primary dyslipidemia is caused by changes (mutations) in genes that are passed down through families (inherited). These mutations cause several types of dyslipidemia. Secondary dyslipidemia is caused by lifestyle choices and diseases that lead to dyslipidemia, such as: Eating a diet that is high in animal fat. Not getting enough exercise. Having diabetes, kidney disease, liver disease, or thyroid disease. Drinking large amounts of alcohol. Using certain medicines. What increases the risk? You are more likely to develop this condition if you are an older man or if you are a woman who has gone through menopause. Other risk factors include: Having a family history of dyslipidemia. Taking certain medicines, including birth control pills, steroids, some diuretics, and beta-blockers. Smoking cigarettes. Eating a high-fat diet. Having certain medical conditions such as diabetes, polycystic ovary syndrome (PCOS), kidney disease, liver disease, or hypothyroidism. Not exercising regularly. Being overweight or obese with too much belly fat. What are the signs or symptoms? In most cases, dyslipidemia does not usually cause any symptoms. In  severe cases, very high lipid levels can cause: Fatty bumps under the skin (xanthomas). White or gray ring around the black center (pupil) of the eye. Very high triglyceride levels can cause inflammation of the pancreas (pancreatitis). How is this diagnosed? Your health care provider may diagnose dyslipidemia based on a routine blood test (fasting blood test). Because most people do not have symptoms of the condition, this blood testing (lipid profile) is done on adults age 20 and older and is repeated every 5 years. This test checks: Total cholesterol. This measures the total amount of cholesterol in your blood, including LDL cholesterol, HDL cholesterol, and triglycerides. A healthy number is below 200. LDL cholesterol. The target number for LDL cholesterol is different for each person, depending on individual risk factors. Ask your health care provider what your LDL cholesterol should be. HDL cholesterol. An HDL level of 60 or higher is best because it helps to protect against heart disease. A number below 40 for men or below 50 for women increases the risk for heart disease. Triglycerides. A healthy triglyceride number is below 150. If your lipid profile is abnormal, your health care provider may do other bloodtests. How is this treated? Treatment depends on the type of dyslipidemia that you have and your other risk factors for heart disease and stroke. Your health care provider will have atarget range for your lipid levels based on this information. For many people, this condition may be treated by lifestyle changes, such as diet and exercise. Your health care provider may recommend that you: Get regular exercise. Make changes to your diet. Quit smoking if you smoke. If diet changes and exercise do not help you reach your goals, your health care provider may also prescribe medicine to lower lipids. The most commonly   prescribed type of medicine lowers your LDL cholesterol (statin drug). If you  have a high triglyceride level, your provider may prescribe another type of drug (fibrate) or an omega-3 fish oil supplement, or both. Follow these instructions at home:  Eating and drinking Follow instructions from your health care provider or dietitian about eating or drinking restrictions. Eat a healthy diet as told by your health care provider. This can help you reach and maintain a healthy weight, lower your LDL cholesterol, and raise your HDL cholesterol. This may include: Limiting your calories, if you are overweight. Eating more fruits, vegetables, whole grains, fish, and lean meats. Limiting saturated fat, trans fat, and cholesterol. If you drink alcohol: Limit how much you use. Be aware of how much alcohol is in your drink. In the [place] one drink equals one 12 oz bottle of beer (355 mL), one 5 oz glass of wine (148 mL), or one 1 oz glass of hard liquor (44 mL). Do not drink alcohol if: Your health care provider tells you not to drink. You are pregnant, may be pregnant, or are planning to become pregnant. Activity Get regular exercise. Start an exercise and strength training program as told by your health care provider. Ask your health care provider what activities are safe for you. Your health care provider may recommend: 30 minutes of aerobic activity 4-6 days a week. Brisk walking is an example of aerobic activity. Strength training 2 days a week. General instructions Do not use any products that contain nicotine or tobacco, such as cigarettes, e-cigarettes, and chewing tobacco. If you need help quitting, ask your health care provider. Take over-the-counter and prescription medicines only as told by your health care provider. This includes supplements. Keep all follow-up visits as told by your health care provider. Contact a health care provider if: You are: Having trouble sticking to your exercise or diet plan. Struggling to quit smoking or control your use of  alcohol. Summary Dyslipidemia often involves a high level of cholesterol or triglycerides, which are types of lipids. Treatment depends on the type of dyslipidemia that you have and your other risk factors for heart disease and stroke. For many people, treatment starts with lifestyle changes, such as diet and exercise. Your health care provider may prescribe medicine to lower lipids. This information is not intended to replace advice given to you by your health care provider. Make sure you discuss any questions you have with your healthcare provider. Document Revised: 03/28/2018 Document Reviewed: 03/04/2018 [employer] Patient Education  2022 [employer]  

## 2021-03-20 NOTE — Progress Notes (Signed)
BP 121/81   Pulse 60   Temp (!) 97.5 F (36.4 C) (Oral)   Ht 5' 4.96" (1.65 m)   Wt 157 lb (71.2 kg)   SpO2 99%   BMI 26.16 kg/m    Subjective:    Patient ID: Evan Cones Sr., male    DOB: 1956/01/14, 65 y.o.   MRN: [id number]  Chief Complaint  Patient presents with   HX OF STROKES    Would like referral for PT   Diarrhea   stomach upset    For the past 2 weeks.     HPI: Evan DUTTON Sr. is a 65 y.o. male  Elevated alkaline phosphatase new onset.   Diarrhea    Chief Complaint  Patient presents with   HX OF STROKES    Would like referral for PT   Diarrhea   stomach upset    For the past 2 weeks.     Relevant past medical, surgical, family and social history reviewed and updated as indicated. Interim medical history since our last visit reviewed. Allergies and medications reviewed and updated.  Review of Systems  Gastrointestinal:  Positive for diarrhea.   Per HPI unless specifically indicated above     Objective:    BP 121/81   Pulse 60   Temp (!) 97.5 F (36.4 C) (Oral)   Ht 5' 4.96" (1.65 m)   Wt 157 lb (71.2 kg)   SpO2 99%   BMI 26.16 kg/m   Wt Readings from Last 3 Encounters:  03/20/21 157 lb (71.2 kg)  03/07/21 155 lb (70.3 kg)  02/26/21 161 lb 3.2 oz (73.1 kg)    Physical Exam Vitals and nursing note reviewed.  Constitutional:      General: He is not in acute distress.    Appearance: Normal appearance. He is not ill-appearing or diaphoretic.  HENT:     Head: Normocephalic and atraumatic.     Right Ear: Tympanic membrane and external ear normal. There is no impacted cerumen.     Left Ear: External ear normal.     Nose: No congestion or rhinorrhea.     Mouth/Throat:     Pharynx: No oropharyngeal exudate or posterior oropharyngeal erythema.  Eyes:     Conjunctiva/sclera: Conjunctivae normal.     Pupils: Pupils are equal, round, and reactive to light.  Cardiovascular:     Rate and Rhythm: Normal rate and regular rhythm.      Heart sounds: No murmur heard.   No friction rub. No gallop.  Pulmonary:     Effort: No respiratory distress.     Breath sounds: No stridor. No wheezing or rhonchi.  Chest:     Chest wall: No tenderness.  Abdominal:     General: Abdomen is flat. Bowel sounds are normal.     Palpations: Abdomen is soft. There is no mass.     Tenderness: There is no abdominal tenderness.  Musculoskeletal:        General: Deformity present.     Cervical back: Normal range of motion and neck supple. No rigidity or tenderness.     Left lower leg: No edema.  Skin:    General: Skin is warm and dry.  Neurological:     Mental Status: He is alert.     Motor: Weakness present.     Gait: Gait abnormal.     Comments: Left sided hemiplegia    Results for orders placed or performed in visit on 02/26/21  Microscopic Examination  Urine  Result Value Ref Range   WBC, UA None seen 0 - 5 /hpf   RBC 0-2 0 - 2 /hpf   Epithelial Cells (non renal) None seen 0 - 10 /hpf   Mucus, UA Present (A) Not Estab.   Bacteria, UA None seen None seen/Few  TSH  Result Value Ref Range   TSH 0.626 0.450 - 4.500 uIU/mL  PSA  Result Value Ref Range   Prostate Specific Ag, Serum 2.3 0.0 - 4.0 ng/mL  Lipid panel  Result Value Ref Range   Cholesterol, Total 231 (H) 100 - 199 mg/dL   Triglycerides 170 (H) 0 - 149 mg/dL   HDL 52 >39 mg/dL   VLDL Cholesterol Cal 31 5 - 40 mg/dL   LDL Chol Calc (NIH) 148 (H) 0 - 99 mg/dL   Chol/HDL Ratio 4.4 0.0 - 5.0 ratio  CBC with Differential/Platelet  Result Value Ref Range   WBC 4.3 3.4 - 10.8 x10E3/uL   RBC 3.91 (L) 4.14 - 5.80 x10E6/uL   Hemoglobin 11.8 (L) 13.0 - 17.7 g/dL   Hematocrit 34.0 (L) 37.5 - 51.0 %   MCV 87 79 - 97 fL   MCH 30.2 26.6 - 33.0 pg   MCHC 34.7 31.5 - 35.7 g/dL   RDW 15.0 11.6 - 15.4 %   Platelets 296 150 - 450 x10E3/uL   Neutrophils 37 Not Estab. %   Lymphs 49 Not Estab. %   Monocytes 10 Not Estab. %   Eos 3 Not Estab. %   Basos 1 Not Estab. %    Neutrophils Absolute 1.6 1.4 - 7.0 x10E3/uL   Lymphocytes Absolute 2.1 0.7 - 3.1 x10E3/uL   Monocytes Absolute 0.4 0.1 - 0.9 x10E3/uL   EOS (ABSOLUTE) 0.1 0.0 - 0.4 x10E3/uL   Basophils Absolute 0.0 0.0 - 0.2 x10E3/uL   Immature Granulocytes 0 Not Estab. %   Immature Grans (Abs) 0.0 0.0 - 0.1 x10E3/uL  Comprehensive metabolic panel  Result Value Ref Range   Glucose 123 (H) 65 - 99 mg/dL   BUN 26 8 - 27 mg/dL   Creatinine, Ser 1.29 (H) 0.76 - 1.27 mg/dL   eGFR 62 >59 mL/min/1.73   BUN/Creatinine Ratio 20 10 - 24   Sodium 137 134 - 144 mmol/L   Potassium 4.7 3.5 - 5.2 mmol/L   Chloride 102 96 - 106 mmol/L   CO2 20 20 - 29 mmol/L   Calcium 10.0 8.6 - 10.2 mg/dL   Total Protein 7.6 6.0 - 8.5 g/dL   Albumin 4.4 3.8 - 4.8 g/dL   Globulin, Total 3.2 1.5 - 4.5 g/dL   Albumin/Globulin Ratio 1.4 1.2 - 2.2   Bilirubin Total <0.2 0.0 - 1.2 mg/dL   Alkaline Phosphatase 202 (H) 44 - 121 IU/L   AST 18 0 - 40 IU/L   ALT 24 0 - 44 IU/L  Urinalysis, Routine w reflex microscopic  Result Value Ref Range   Specific Gravity, UA 1.025 1.005 - 1.030   pH, UA 5.5 5.0 - 7.5   Color, UA Yellow Yellow   Appearance Ur Clear Clear   Leukocytes,UA Negative Negative   Protein,UA 2+ (A) Negative/Trace   Glucose, UA Negative Negative   Ketones, UA Negative Negative   RBC, UA Trace (A) Negative   Bilirubin, UA Negative Negative   Urobilinogen, Ur 1.0 0.2 - 1.0 mg/dL   Nitrite, UA Negative Negative   Microscopic Examination See below:   Bayer DCA Hb A1c Waived (STAT)  Result Value Ref  Range   HB A1C (BAYER DCA - WAIVED) 7.1 (H) <7.0 %        Current Outpatient Medications:    ACCU-CHEK GUIDE test strip, Check fsbs once daily, Disp: 100 each, Rfl: 2   Accu-Chek Softclix Lancets lancets, SMARTSIG:Topical, Disp: 100 each, Rfl: 1   albuterol (VENTOLIN HFA) 108 (90 Base) MCG/ACT inhaler, Inhale 2 puffs into the lungs every 6 (six) hours as needed for wheezing or shortness of breath., Disp: 8 g, Rfl: 1    Blood Glucose Monitoring Suppl (FIFTY50 GLUCOSE METER 2.0) w/Device KIT, Use as instructed, Disp: , Rfl:    Blood Glucose Monitoring Suppl KIT, Accucheck Guide with lancets and test strips #100 with one refill.  Test blood sugar twice daily. DX Code E11.65 ; Z79.4, Disp: 1 kit, Rfl: 0   Continuous Blood Gluc Receiver (FREESTYLE LIBRE 2 READER) DEVI, 1 each by Does not apply route daily., Disp: 1 each, Rfl: 0   Continuous Blood Gluc Sensor (FREESTYLE LIBRE 2 SENSOR) MISC, 1 each by Does not apply route every 14 (fourteen) days., Disp: 6 each, Rfl: 1   Insulin Pen Needle 32G X 6 MM MISC, Daily, Disp: 100 each, Rfl: 3   lisinopril (ZESTRIL) 40 MG tablet, Take 1 tablet (40 mg total) by mouth daily., Disp: 90 tablet, Rfl: 3   metoCLOPramide (REGLAN) 5 MG tablet, Take 1 tablet (5 mg total) by mouth 3 (three) times daily before meals., Disp: 90 tablet, Rfl: 2   metoprolol succinate (TOPROL-XL) 25 MG 24 hr tablet, Take 1 tablet (25 mg total) by mouth daily., Disp: 30 tablet, Rfl: 3   Misc. Devices MISC, One pair of Compression stockings  Hemiplegia and hemiparesis following cerebral infarction affecting left non-dominant side ([place])  - Primary Codes: J28.786 Lower extremity edema  Codes: R60.0 Type 2 diabetes mellitus with hyperglycemia, with long-term current use of insulin (HCC)  Codes: E11.65, Z79.4, Disp: 1 Units, Rfl: 0   omeprazole (PRILOSEC) 40 MG capsule, Take 1 capsule (40 mg total) by mouth in the morning and at bedtime., Disp: 90 capsule, Rfl: 1   sitaGLIPtin (JANUVIA) 50 MG tablet, Take 1 tablet (50 mg total) by mouth daily., Disp: 30 tablet, Rfl: 3   apixaban (ELIQUIS) 5 MG TABS tablet, Take 1 tablet (5 mg total) by mouth 2 (two) times daily., Disp: 60 tablet, Rfl: 1   atorvastatin (LIPITOR) 80 MG tablet, Take 1 tablet (80 mg total) by mouth daily., Disp: 90 tablet, Rfl: 1   baclofen (LIORESAL) 10 MG tablet, Take 1 tablet (10 mg total) by mouth 3 (three) times daily., Disp: 90 each, Rfl: 1    dapagliflozin propanediol (FARXIGA) 10 MG TABS tablet, Take 1 tablet (10 mg total) by mouth daily before breakfast. In place of glipizide, Disp: 90 tablet, Rfl: 1    Assessment & Plan:  HTN :  Recheck change hctz to meotpolol sec to pt having a hard time to pee sec to this diuretic Medication compliance emphasised. pt advised to keep Bp logs. Pt verbalised understanding of the same. Pt to have a low salt diet . Exercise to reach a goal of at least 150 mins a week.  lifestyle modifications explained and pt understands importance of the above.    2. DM : a1c now at 7.1 Continue farxiga and [place] Stopped insulin last visit check HbA1c,  urine  microalbumin  diabetic diet plan given to pt  adviced regarding hypoglycemia and instructions given to pt today on how to prevent and treat the  same if it were to occur. pt acknowledges the plan and voices understanding of the same.  exercise plan given and encouraged.   advice diabetic yearly podiatry, ophthalmology , nutritionist , dental check q 6 months,  3. Diabetic gstroparesis ? Is on reglan for such   4. . Left sided hemiplegia/ CVA  Walking with a walker Getting PT was 2 days a week. Pts wife is a home health aide Lucianne Lei needs to increase PT per their recs, Will get PT eval and tx.  fu and me per neurology    5. Depression resolved per pt, will need to d/c cymbalata rx last month, couldn't tolerate it.    6. Atrial fibrillation Stable, add metoprolol fu with cardiollgy ? Still has such Is on eliquis bid for such will need to be evaluated.   7. Wheezing ? Ho smoking x 1 ppd x 10 yrs. Stopped x last year Was placed on albuterol last visit, helped per pt..  8. HLD : LDL 148, no more red meat.  Doesn't eat veggies at all,  recheck FLP, check LFT's work on diet, SE of meds explained to pt. low fat and high fiber diet explained to pt.   Ref. Range 02/26/2021 11:32  Total CHOL/HDL Ratio Latest Ref Range: 0.0 - 5.0 ratio 4.4  Cholesterol,  Total Latest Ref Range: 100 - 199 mg/dL 231 (H)  HDL Cholesterol Latest Ref Range: >39 mg/dL 52  Triglycerides Latest Ref Range: 0 - 149 mg/dL 170 (H)  VLDL Cholesterol Cal Latest Ref Range: 5 - 40 mg/dL 31  LDL Chol Calc (NIH) Latest Ref Range: 0 - 99 mg/dL 148 (H)   recheck FLP, check LFT's work on diet, SE of meds explained to pt. low fat and high fiber diet explained to pt.   Problem List Items Addressed This Visit       Cardiovascular and Mediastinum   Atrial fibrillation ([place])     Endocrine   Diabetes mellitus ([place])     Other   Muscle spasm of left lower extremity     No orders of the defined types were placed in this encounter.    No orders of the defined types were placed in this encounter.    Follow up plan: No follow-ups on file.

## 2021-03-21 ENCOUNTER — Other Ambulatory Visit: Payer: Self-pay | Admitting: Internal Medicine

## 2021-03-21 ENCOUNTER — Other Ambulatory Visit: Payer: Self-pay | Admitting: Family Medicine

## 2021-03-21 DIAGNOSIS — K219 Gastro-esophageal reflux disease without esophagitis: Secondary | ICD-10-CM

## 2021-03-27 ENCOUNTER — Ambulatory Visit: Payer: Medicaid Other | Admitting: Family Medicine

## 2021-03-31 ENCOUNTER — Ambulatory Visit: Payer: Medicaid Other | Admitting: Internal Medicine

## 2021-04-02 ENCOUNTER — Encounter: Payer: Self-pay | Admitting: Physical Therapy

## 2021-04-02 ENCOUNTER — Other Ambulatory Visit: Payer: Self-pay

## 2021-04-02 ENCOUNTER — Ambulatory Visit: Payer: Medicaid Other | Admitting: Internal Medicine

## 2021-04-02 ENCOUNTER — Ambulatory Visit: Payer: Medicaid Other | Attending: Internal Medicine | Admitting: Physical Therapy

## 2021-04-02 VITALS — BP 191/93 | HR 66

## 2021-04-02 DIAGNOSIS — M6281 Muscle weakness (generalized): Secondary | ICD-10-CM | POA: Diagnosis not present

## 2021-04-02 DIAGNOSIS — R2681 Unsteadiness on feet: Secondary | ICD-10-CM | POA: Insufficient documentation

## 2021-04-02 DIAGNOSIS — I69354 Hemiplegia and hemiparesis following cerebral infarction affecting left non-dominant side: Secondary | ICD-10-CM | POA: Insufficient documentation

## 2021-04-02 DIAGNOSIS — Z9181 History of falling: Secondary | ICD-10-CM | POA: Diagnosis not present

## 2021-04-02 DIAGNOSIS — R262 Difficulty in walking, not elsewhere classified: Secondary | ICD-10-CM | POA: Diagnosis not present

## 2021-04-02 DIAGNOSIS — M79602 Pain in left arm: Secondary | ICD-10-CM | POA: Diagnosis not present

## 2021-04-02 NOTE — Patient Instructions (Signed)
Access Code: [email or web address] URL: [email or web address] Date: 04/02/2021 Prepared by: Dorene Grebe  Exercises  Seated Shoulder Flexion Self PROM - 1 x daily - 7 x weekly - 3 sets - 10 reps Seated Shoulder Abduction AAROM with Dowel - 1 x daily - 7 x weekly - 3 sets - 10 reps Seated Shoulder External Rotation AAROM with Cane and Hand in Neutral - 1 x daily - 7 x weekly - 3 sets - 10 reps Hand PROM Finger Extension - 1 x daily - 7 x weekly - 3 sets - 10 reps Supine Straight Leg Raises - 1 x daily - 7 x weekly - 3 sets - 10 reps Supine Bridge - 1 x daily - 7 x weekly - 3 sets - 10 reps Seated Hamstring Stretch - 1 x daily - 7 x weekly - 3 sets - 10 reps Seated Calf Stretch with Strap - 1 x daily - 7 x weekly - 3 sets - 10 reps

## 2021-04-02 NOTE — Therapy (Signed)
[facility] [address] [place], [place], [address] Phone: [phone]   Fax:  [phone]  Physical Therapy Treatment and Initial eval 04/02/2021  Patient Details  Name: Evan AnoDuncan E Coin Sr. MRN: [id number] Date of Birth: June 19, 1956 Referring Provider (PT): Loura PardonAvanti Vigg MD   Encounter Date: 04/02/2021   PT End of Session - 04/02/21 1122     Visit Number 1    Number of Visits 7    Date for PT Re-Evaluation 05/21/21    Authorization Type [place] Medicaid; MD cert 01/23/61-9/5/285/9/22-02/17/21 (prior cert 11/16/30-4/40/102/3/22-4/28/22)    Authorization Time Period 8 PT visits 6/1-8/5; 2/10-5/24 (15 visits approved, 16 visits used)    Authorization - Visit Number 1    Authorization - Number of Visits 10    Progress Note Due on Visit 10    PT Start Time 0958    PT Stop Time 1101    PT Time Calculation (min) 63 min    Equipment Utilized During Treatment Gait belt    Activity Tolerance Patient tolerated treatment well;Treatment limited secondary to medical complications (Comment)   CVA   Behavior During Therapy [facility] for tasks assessed/performed;Flat affect             Past Medical History:  Diagnosis Date   Carotid arterial disease (HCC)    a. 08/2018 Carotid U/S: min-mod RICA atherosclerosis w/o hemodynamically significant stenosis. Nl LICA.   Diabetes 1.5, managed as type 2 (HCC)    Diastolic dysfunction    a. 08/2018 Echo: EF 65%. No rwma. Gr1 DD. Mild MR.   Diastolic dysfunction    a. 08/2019 Echo: EF 55-60%, Gr1 DD. No rwma. Mild MR. RVSP 37.166mmHg.   Hypercholesterolemia    Hypertension    PAF (paroxysmal atrial fibrillation) (HCC)    a. 10/2019 Event Monitor: PAF; b. CHA2DS2VASc = 5-->Eliquis.   Poorly controlled diabetes mellitus (HCC)    a. 04/2019 A1c 13.8.   Recurrent strokes (HCC)    a. 10/2016 MRI/A: Acute 5mm R thalamic infarct, ? subacute infarct of R corona radiata; b. 08/2017 MRI/A: Acute 8mm lateral L thalamic infarct. Other more remote lacunar  infarcts of thalami bilat. Small vessel dzs; c. 08/2018 MRI/A: Acute lacunar infarct of the post limb of R internal capsule; d. 08/2019 MRI Acute CVA of L paramedian pons adn R cerebellar hemisphere.   Tobacco abuse     Past Surgical History:  Procedure Laterality Date   ESOPHAGOGASTRODUODENOSCOPY (EGD) WITH PROPOFOL N/A 04/26/2019   Procedure: ESOPHAGOGASTRODUODENOSCOPY (EGD) WITH PROPOFOL;  Surgeon: Toney ReilVanga, Rohini Reddy, MD;  [facility];  Service: Gastroenterology;  Laterality: N/A;    Vitals:   04/02/21 1000  BP: (!) 191/93  Pulse: 66  SpO2: 99%     Subjective Assessment - 04/02/21 1010     Subjective Pt presents to tx with no change in functional status from previous PT POC. Pt C/Cs at this time: Putting socks on, walking, mobility and use of LUE. Pt stated that he is able to complete home based mobility with a functional status. Pt stated that he has 3 steps to enter the house and able to complete well. Pt denies any falls in the last 6 months. Pt's wife stated good adherence to medication and blood glucose levels. Pt stated normal blood pressure in the home is ~140/80. Vitals at the start of tx: BP: 191/93 HR: 66.  Pt states severe pain in the L hand and would like new splint.    Patient is accompained  by: Family member    Pertinent History Pertinent history and PMH: HTN, uncontrolled DM, HLD, R basal ganglia stroke on 08/2018 and L pointine and R cerebellar stroke 09/07/19, sinus bradycardia, peripheral neuropat    Limitations Walking;House hold activities    How long can you sit comfortably? WNL    How long can you stand comfortably? 2-3 mins    How long can you walk comfortably? 100 ft    Diagnostic tests N/A    Patient Stated Goals Walk better and increase use of LUE    Currently in Pain? Yes    Pain Score 9     Pain Location Hand    Pain Orientation Left    Pain Descriptors / Indicators Aching;Tingling;Numbness    Pain Type Chronic pain    Pain Radiating Towards  n/a    Pain Onset More than a month ago    Pain Frequency Constant    Aggravating Factors  LUE movemnent    Pain Relieving Factors Time in splint             SUBJECTIVE Chief complaint: Pt presents to tx with no change in functional status from previous PT POC. Pt C/Cs at this time: Putting socks on, walking, mobility and use of LUE. Pt stated that he is able to complete home based mobility with a functional status. Pt stated that he has 3 steps to enter the house and able to complete well. Pt denies any falls in the last 6 months. Pt's wife stated good adherence to medication and blood glucose levels. Pt stated normal blood pressure in the home is ~140/80. Vitals at the start of tx: BP: 191/93 HR: 66.  Pt states severe pain in the L hand and would like new splint.  History: Pertinent history and PMH: HTN, uncontrolled DM, HLD, R basal ganglia stroke on 08/2018 and L pointine and R cerebellar stroke 09/07/19, sinus bradycardia, peripheral neuropathy  Referring Dx: Hemiplegia and Hemiparesis Referring Provider: Loura Pardon MD Pain location: L hand  Pain: Present 9/10, Best 6/10, Worst 10/10: Pain quality: ache  Radiating pain: n/a  Numbness/Tingling: yes in global L hand 24 hour pain behavior: consent pain  Aggravating factors:movement  Easing factors: Time in the splint.  How long can you sit: [facility]  How long can you stand: 2-3 mins  How long can you walk: 100 ft  History of back injury, pain, surgery, or therapy: well know to PT  Follow-up appointment with MD: Next month  Dominant hand: R Falls in the last 6 months: Nope  Occupational demands: Retired Goals: Walk better and use his LUE  Red flags (bowel/bladder changes, saddle paresthesia, personal history of cancer, chills/fever, night sweats, unrelenting pain, first onset of insidious LBP <20 y/o) Negative    OBJECTIVE  Mental Status Patient is oriented to person, place and time.  Recent memory is intact.  Patient's fund of  knowledge is within normal limits for educational level.  SENSATION: Deferred to lateral tx.    MUSCULOSKELETAL: Tremor: None Tone: Increased spasticity of the L UE/LE.   Posture  Minor kyphosis and lateral R bias due to greater strength and WB tolerance of the R side.   Gait Slow speed gait with single forearm crutch. L hip hike, R lat lean, during LLE swing phase. Absence of L heel strike. Poor bilat stride length R>L.   Palpation Deferred.    Strength (out of 5) R/L 4/4 Hip flexion 5/5 Hip abduction 5/5 Hip adduction 5/4 Knee extension 5/4  Knee flexion 5/4 Ankle dorsiflexion      AROM (degrees) R/L  Hip Flexion (0-125): WNL Hip Abduction (0-40): WNL Knee Flexion (0-135): WNL Knee Extension (0): WNL Ankle DF (0-35): 7/-11 (PROM does not significantly change measurement) Ankle PL (0-50) WNL Shoulder flexion: R: WNL L: 0 AROM. 120 PROM  Shoulder abduction: R : WNL L: 90 AROM. 90 PROM   Shoulder IR: WNL bilat  Shoulder ER R: WNL. L: AROM 0 deg. PROM 16 Elbow flexion R WNL. L: deferred Elbow ext. R WNL. L: AROM -90 PROM -50      Muscle Length Hamstrings: R: WNL L: lacking 25 degrees     SPECIAL TESTS  SLR (SN 92, -LR 0.29): neg bilat    Hip: FABER (SN 81): neg hypomobile  FADIR (SN 94):neg hypomobile  Hip scour (SN 50):neg hypomobile     Functional Tasks   Sit to stand: Needs R UE assist. Unable to complete without UE assist when cued.    Outcome measure:   5xSTS: 18.79 sec with UE assist.    FOTO: Deferred        PT Education - 04/02/21 1120     Education Details Pt was given an update HEP to address UE/LE strength and ROM deficits.    Person(s) Educated Patient;Spouse    Methods Explanation;Demonstration;Tactile cues;Verbal cues;Handout    Comprehension Verbalized understanding;Need further instruction                 PT Long Term Goals - 04/02/21 1126       PT LONG TERM GOAL #1   Title Pt will improve foto score to  predicted improvement value to assess functional status with ADLs    Baseline 8/17: deferred    Time 7    Period Weeks    Status New    Target Date 05/21/21      PT LONG TERM GOAL #2   Title Pt will improve 5xSTS by 3 seconds to promote greater functional LE power during transfers and reduce need of UE assist.    Baseline 5xSTS: 18.79 sec with UE assist.    Time 7    Period Weeks    Status New    Target Date 05/21/21      PT LONG TERM GOAL #3   Title Pt will be independent with HEP in order to improve  L UE posture by 10 degs where limited.    Baseline Shoulder flexion: R: WNL L: 0 AROM. 120 PROM   Shoulder abduction: R : WNL L: 90 AROM. 90 PROM    Shoulder IR: WNL bilat   Shoulder ER R: WNL. L: AROM 0 deg. PROM 16  Elbow flexion R WNL. L: deferred  Elbow ext. R WNL. L: AROM -90 PROM -50    Time 7    Period Weeks    Status New    Target Date 05/21/21      PT LONG TERM GOAL #4   Title Pt will improve LLE ROM equal to RLE to faciliate a normallized gait pattern and decrease fall risk.    Baseline Ankle DF (0-35): 7/-11 (PROM does not significantly change measurement) L hamstring mobility: R: WNL L: lacking 25 degrees    Time 7    Period Weeks    Status New    Target Date 05/21/21                   Plan - 04/02/21 1124     Clinical Impression Statement  Pt is a pleasant 65 year-old male referred PT for amb deficits 2/2 L UE/LE hemiplegia and hemiparesis. PT examination reveals the following  deficits: 5xSTS: 18.79 seconds. UE ROM: Shoulder flexion: R: WNL L: 0 AROM. 120 PROM   Shoulder abduction: R : WNL L: 90 AROM. 90 PROM    Shoulder IR: WNL bilat   Shoulder ER R: WNL. L: AROM 0 deg. PROM 16  Elbow flexion R WNL. L: deferred  Elbow ext. R WNL. L: AROM -90 PROM -50 LE ROM deficits: Ankle DF (0-35): 7/-11 (PROM does not significantly change measurement) L hamstring mobility: R: WNL L: lacking 25 degrees. FOTO was deferred to second tx. Gait assessment: Slow speed gait with R  single forearm crutch. L hip hike, R lat lean, during LLE swing phase. Absence of L heel strike. Poor bilat stride length R>L. Pt's case displays as moderate complexity due to time from onset,  extensive co morbidities, current functional status. Pt prognosis is fair due to chronic deficits and POC will be focused on maintenance of remaining functional status. Pt will benefit from skilled PT 1x week for 7 weeks.    Personal Factors and Comorbidities Age;Comorbidity 3+;Education;Finances;Past/Current Experience;Fitness;Transportation    Comorbidities HTN, uncontrolled DM, HLD, R basal ganglia stroke on 08/2018 and L pointine and R cerebellar stroke 09/07/19, sinus bradycardia, peripheral neuropathy    Examination-Activity Limitations Bathing;Bed Mobility;Dressing;Transfers;Squat;Lift;Locomotion Level;Stairs;Reach Overhead;Carry;Stand;Sit    Examination-Participation Restrictions Community Activity;Interpersonal Relationship;Yard Work    Conservation officer, historic buildings Evolving/Moderate complexity    Clinical Decision Making Moderate    Rehab Potential Fair    PT Frequency 1x / week    PT Duration Other (comment)   7 weeks   PT Treatment/Interventions ADLs/Self Care Home Management;Aquatic Therapy;Electrical Stimulation;Moist Heat;DME Instruction;Gait training;Stair training;Functional mobility training;Therapeutic activities;Therapeutic exercise;Balance training;Neuromuscular re-education;Patient/family education;Orthotic Fit/Training;Dry needling;Manual techniques;Canalith Repostioning;Passive range of motion;Biofeedback;Energy conservation;Joint Manipulations;Vestibular    PT Next Visit Plan reasses adherence to HEP    PT Home Exercise Plan [email or web address]    Consulted and Agree with Plan of Care Patient    Family Member Consulted wife             Patient will benefit from skilled therapeutic intervention in order to improve the following deficits and impairments:  Abnormal gait, Decreased  coordination, Decreased range of motion, Difficulty walking, Impaired tone, Decreased endurance, Impaired UE functional use, Decreased activity tolerance, Impaired perceived functional ability, Decreased balance, Impaired flexibility, Decreased cognition, Decreased mobility, Decreased strength, Increased edema, Postural dysfunction, Pain, Cardiopulmonary status limiting activity, Decreased safety awareness, Hypomobility, Improper body mechanics  Visit Diagnosis: Hemiplegia and hemiparesis following cerebral infarction affecting left non-dominant side (HCC)  Difficulty in walking, not elsewhere classified  Pain in left arm  Unsteadiness on feet  At risk for falls     Problem List Patient Active Problem List   Diagnosis Date Noted   Gastroparesis 02/26/2021   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   Muscle spasm of left lower extremity 02/26/2021   History of multiple strokes 02/26/2021   Type 2 diabetes mellitus with hyperglycemia (HCC) 10/29/2020   Diastolic dysfunction    History of diabetes with ketoacidosis 09/20/2020   CKD (chronic kidney disease) stage 2, GFR 60-89 ml/min 06/27/2020   Monitoring for anticoagulant use 06/27/2020   Lower extremity edema 05/16/2020   Atrial fibrillation (HCC) 10/26/2019   Coagulopathy (HCC) 06/12/2019   Gastroesophageal reflux disease    Hemiplegia and hemiparesis following cerebral infarction affecting left non-dominant side (HCC) 11/25/2018   RBBB 10/15/2018   Dysphagia, post-stroke  Microalbuminuria 07/29/2018   Hyperlipidemia LDL goal <70 02/11/2017   Recurrent strokes (HCC) 11/10/2016   Essential hypertension 11/10/2016   Diabetes mellitus (HCC) 11/10/2016   Tobacco abuse 08/28/2013   Peripheral neuropathy 08/28/2013   Cammie Mcgee, PT, DPT # [id number] Lawernce Ion SPT 04/02/2021, 12:18 PM  [facility] [address] [place], [place], [address] Phone: [phone]   Fax:   [phone]  Name: Evan Townsend Sr. MRN: [id number] Date of Birth: [birth year]

## 2021-04-08 ENCOUNTER — Ambulatory Visit: Payer: Medicaid Other

## 2021-04-10 ENCOUNTER — Encounter: Payer: Medicaid Other | Admitting: Physical Therapy

## 2021-04-14 ENCOUNTER — Ambulatory Visit: Payer: Medicaid Other | Attending: Internal Medicine

## 2021-04-17 ENCOUNTER — Encounter: Payer: Medicaid Other | Admitting: Physical Therapy

## 2021-04-18 ENCOUNTER — Telehealth: Payer: Self-pay

## 2021-04-18 NOTE — Telephone Encounter (Signed)
Approval for PT/OT  for 04/10/21 through 05/22/21

## 2021-04-22 ENCOUNTER — Ambulatory Visit: Payer: Medicaid Other | Attending: Internal Medicine | Admitting: Physical Therapy

## 2021-04-22 ENCOUNTER — Encounter: Payer: Self-pay | Admitting: Physical Therapy

## 2021-04-22 DIAGNOSIS — I6381 Other cerebral infarction due to occlusion or stenosis of small artery: Secondary | ICD-10-CM | POA: Insufficient documentation

## 2021-04-22 DIAGNOSIS — R490 Dysphonia: Secondary | ICD-10-CM | POA: Diagnosis not present

## 2021-04-22 DIAGNOSIS — R2689 Other abnormalities of gait and mobility: Secondary | ICD-10-CM | POA: Insufficient documentation

## 2021-04-22 DIAGNOSIS — Z9181 History of falling: Secondary | ICD-10-CM | POA: Diagnosis not present

## 2021-04-22 DIAGNOSIS — M79602 Pain in left arm: Secondary | ICD-10-CM | POA: Insufficient documentation

## 2021-04-22 DIAGNOSIS — R2681 Unsteadiness on feet: Secondary | ICD-10-CM | POA: Insufficient documentation

## 2021-04-22 DIAGNOSIS — M6281 Muscle weakness (generalized): Secondary | ICD-10-CM | POA: Diagnosis not present

## 2021-04-22 DIAGNOSIS — R278 Other lack of coordination: Secondary | ICD-10-CM | POA: Insufficient documentation

## 2021-04-22 DIAGNOSIS — R471 Dysarthria and anarthria: Secondary | ICD-10-CM | POA: Diagnosis not present

## 2021-04-22 DIAGNOSIS — R262 Difficulty in walking, not elsewhere classified: Secondary | ICD-10-CM | POA: Insufficient documentation

## 2021-04-22 DIAGNOSIS — I69354 Hemiplegia and hemiparesis following cerebral infarction affecting left non-dominant side: Secondary | ICD-10-CM | POA: Insufficient documentation

## 2021-04-22 NOTE — Therapy (Signed)
[facility] [address] [place], [place], [address] Phone: [phone]   Fax:  [phone]  Physical Therapy Treatment  Patient Details  Name: Evan STETTLER Sr. MRN: [id number] Date of Birth: [birth year] Referring Provider (PT): Loura Pardon, MD   Encounter Date: 04/22/2021   PT End of Session - 04/22/21 1652     Visit Number 2    Number of Visits 7    Date for PT Re-Evaluation 05/21/21    Authorization Type [place] Medicaid; MD cert 02/19/87-10/16/52 (prior cert 04/24/25-11/29/81)    Authorization Time Period 8 PT visits 6/1-8/5; 2/10-5/24 (15 visits approved, 16 visits used)    Authorization - Visit Number 2    Authorization - Number of Visits 10    Progress Note Due on Visit 10    PT Start Time 1600    PT Stop Time 1642    PT Time Calculation (min) 42 min    Equipment Utilized During Treatment Gait belt    Activity Tolerance Patient tolerated treatment well;Treatment limited secondary to medical complications (Comment)   CVA   Behavior During Therapy [facility] for tasks assessed/performed;Flat affect             Past Medical History:  Diagnosis Date   Carotid arterial disease (HCC)    a. 08/2018 Carotid U/S: min-mod RICA atherosclerosis w/o hemodynamically significant stenosis. Nl LICA.   Diabetes 1.5, managed as type 2 (HCC)    Diastolic dysfunction    a. 08/2018 Echo: EF 65%. No rwma. Gr1 DD. Mild MR.   Diastolic dysfunction    a. 08/2019 Echo: EF 55-60%, Gr1 DD. No rwma. Mild MR. RVSP 37.47mmHg.   Hypercholesterolemia    Hypertension    PAF (paroxysmal atrial fibrillation) (HCC)    a. 10/2019 Event Monitor: PAF; b. CHA2DS2VASc = 5-->Eliquis.   Poorly controlled diabetes mellitus (HCC)    a. 04/2019 A1c 13.8.   Recurrent strokes (HCC)    a. 10/2016 MRI/A: Acute 65mm R thalamic infarct, ? subacute infarct of R corona radiata; b. 08/2017 MRI/A: Acute 25mm lateral L thalamic infarct. Other more remote lacunar infarcts of thalami bilat. Small  vessel dzs; c. 08/2018 MRI/A: Acute lacunar infarct of the post limb of R internal capsule; d. 08/2019 MRI Acute CVA of L paramedian pons adn R cerebellar hemisphere.   Tobacco abuse     Past Surgical History:  Procedure Laterality Date   ESOPHAGOGASTRODUODENOSCOPY (EGD) WITH PROPOFOL N/A 04/26/2019   Procedure: ESOPHAGOGASTRODUODENOSCOPY (EGD) WITH PROPOFOL;  Surgeon: Toney Reil, MD;  Location: [facility] END[facility];  Service: Gastroenterology;  Laterality: N/A;    There were no vitals filed for this visit.   Subjective Assessment - 04/22/21 1650     Subjective Pt presents to tx without falls from last tx.  Pt had no pain at the start of tx. Pt presents to tx with marked emtional stress 2/2 family trauma of grandson.    Patient is accompained by: Family member    Pertinent History Pertinent history and PMH: HTN, uncontrolled DM, HLD, R basal ganglia stroke on 08/2018 and L pointine and R cerebellar stroke 09/07/19, sinus bradycardia, peripheral neuropathy    Limitations Walking;House hold activities    How long can you sit comfortably? WNL    How long can you stand comfortably? 2-3 mins    How long can you walk comfortably? 100 ft    Diagnostic tests N/A    Patient Stated Goals Walk better and increase use of LUE  Currently in Pain? No/denies    Pain Score 0-No pain    Pain Onset More than a month ago               Treatment   Therapeutic Exercise:   NuStep, L4, Position 10. Verbal and contact cueing for proper LE/UE mechanics during movement.   6'' toe taps 1 min x 3 with gait belt and CGA-Min A with extensive verbal cue to proper step height   Nautilus lat pull down 3x10, 20#. Contact and verbal cueing to ensure proper UE mechanics.   Amb in clinic 20-30 ft bouts x 4 with CGA-min A without single Lofstrand crutch   Manual Therapy:  Prolonged 16 min light load-long duration L elbow flexion with supination. MPH placed over the L upper arm with one layer between  screen. Skin displayed mild warm and normal response to heat.            PT Education - 04/22/21 1651     Education Details Pt was educated on proper form and techique during therex.    Person(s) Educated Patient    Methods Explanation;Demonstration;Tactile cues;Verbal cues    Comprehension Verbalized understanding;Need further instruction                 PT Long Term Goals - 04/02/21 1126       PT LONG TERM GOAL #1   Title Pt. able to ambulate 500 feet with least assistive device without abnormal change in vitals/BP to improve safe functional mobility.    Baseline amb. with use of single lofstrand crutch and antalgic gait pattern.    Time 7    Period Weeks    Status New    Target Date 05/21/21      PT LONG TERM GOAL #2   Title Pt will improve 5xSTS by 3 seconds to promote greater functional LE power during transfers and reduce need of UE assist.    Baseline 5xSTS: 18.79 sec with UE assist.    Time 7    Period Weeks    Status New    Target Date 05/21/21      PT LONG TERM GOAL #3   Title Pt will be independent with HEP in order to improve  L UE posture by 10 degs to improve reaching/ functional tasks.    Baseline Shoulder flexion: R: WNL L: 0 AROM. 120 PROM   Shoulder abduction: R : WNL L: 90 AROM. 90 PROM    Shoulder IR: WNL bilat   Shoulder ER R: WNL. L: AROM 0 deg. PROM 16  Elbow flexion R WNL. L: deferred  Elbow ext. R WNL. L: AROM -90 PROM -50    Time 7    Period Weeks    Status New    Target Date 05/21/21      PT LONG TERM GOAL #4   Title Pt will improve LLE ROM equal to RLE to faciliate a normallized gait pattern and decrease fall risk.    Baseline Ankle DF (0-35): 7/-11 (PROM does not significantly change measurement) L hamstring mobility: R: WNL L: lacking 25 degrees    Time 7    Period Weeks    Status New    Target Date 05/21/21                   Plan - 04/22/21 1655     Clinical Impression Statement Pt tolerated tx very well with  addition of Nautilus pull down. Pt requires CGA-min A with  gait without AD due to freq loss of balance. Pt continues to display mark LE/UE endurance, strength, and ROM deficits resulting in decreased safe home/community mobiity, increased pain, and limited access to QOL. Pt. will continue to benefit from skilled physical therapy to progress POC to address remaining deficits to facilitate maximum functional capacity for optimal personal health and wellness for ADLs.    Personal Factors and Comorbidities Age;Comorbidity 3+;Education;Finances;Past/Current Experience;Fitness;Transportation    Comorbidities HTN, uncontrolled DM, HLD, R basal ganglia stroke on 08/2018 and L pointine and R cerebellar stroke 09/07/19, sinus bradycardia, peripheral neuropathy    Examination-Activity Limitations Bathing;Bed Mobility;Dressing;Transfers;Squat;Lift;Locomotion Level;Stairs;Reach Overhead;Carry;Stand;Sit    Examination-Participation Restrictions Community Activity;Interpersonal Relationship;Yard Work    Conservation officer, historic buildings Evolving/Moderate complexity    Clinical Decision Making Moderate    Rehab Potential Fair    PT Frequency 1x / week    PT Duration Other (comment)   7 weeks   PT Treatment/Interventions ADLs/Self Care Home Management;Aquatic Therapy;Electrical Stimulation;Moist Heat;DME Instruction;Gait training;Stair training;Functional mobility training;Therapeutic activities;Therapeutic exercise;Balance training;Neuromuscular re-education;Patient/family education;Orthotic Fit/Training;Dry needling;Manual techniques;Canalith Repostioning;Passive range of motion;Biofeedback;Energy conservation;Joint Manipulations;Vestibular    PT Next Visit Plan Reasses adherence to HEP    PT Home Exercise Plan [email or web address]    Consulted and Agree with Plan of Care Patient    Family Member Consulted wife             Patient will benefit from skilled therapeutic intervention in order to improve the following  deficits and impairments:  Abnormal gait, Decreased coordination, Decreased range of motion, Difficulty walking, Impaired tone, Decreased endurance, Impaired UE functional use, Decreased activity tolerance, Impaired perceived functional ability, Decreased balance, Impaired flexibility, Decreased cognition, Decreased mobility, Decreased strength, Increased edema, Postural dysfunction, Pain, Cardiopulmonary status limiting activity, Decreased safety awareness, Hypomobility, Improper body mechanics  Visit Diagnosis: Hemiplegia and hemiparesis following cerebral infarction affecting left non-dominant side (HCC)  Difficulty in walking, not elsewhere classified  Unsteadiness on feet  Pain in left arm  At risk for falls     Problem List Patient Active Problem List   Diagnosis Date Noted   Gastroparesis 02/26/2021   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   Muscle spasm of left lower extremity 02/26/2021   History of multiple strokes 02/26/2021   Type 2 diabetes mellitus with hyperglycemia (HCC) 10/29/2020   Diastolic dysfunction    History of diabetes with ketoacidosis 09/20/2020   CKD (chronic kidney disease) stage 2, GFR 60-89 ml/min 06/27/2020   Monitoring for anticoagulant use 06/27/2020   Lower extremity edema 05/16/2020   Atrial fibrillation (HCC) 10/26/2019   Coagulopathy (HCC) 06/12/2019   Gastroesophageal reflux disease    Hemiplegia and hemiparesis following cerebral infarction affecting left non-dominant side (HCC) 11/25/2018   RBBB 10/15/2018   Dysphagia, post-stroke    Microalbuminuria 07/29/2018   Hyperlipidemia LDL goal <70 02/11/2017   Recurrent strokes (HCC) 11/10/2016   Essential hypertension 11/10/2016   Diabetes mellitus (HCC) 11/10/2016   Tobacco abuse 08/28/2013   Peripheral neuropathy 08/28/2013   Cammie Mcgee, PT, DPT # [id number] Lawernce Ion SPT 04/23/2021, 8:44 AM  [facility] [address]. [place], [place], [address] Phone: [phone]   Fax:  [phone]  Name: Evan Ano Sr. MRN: [id number] Date of Birth: [birth year]

## 2021-04-24 ENCOUNTER — Other Ambulatory Visit: Payer: Self-pay

## 2021-04-24 ENCOUNTER — Encounter: Payer: Self-pay | Admitting: Physical Therapy

## 2021-04-24 ENCOUNTER — Ambulatory Visit: Payer: Medicaid Other | Admitting: Physical Therapy

## 2021-04-24 DIAGNOSIS — I6381 Other cerebral infarction due to occlusion or stenosis of small artery: Secondary | ICD-10-CM | POA: Diagnosis not present

## 2021-04-24 DIAGNOSIS — R2689 Other abnormalities of gait and mobility: Secondary | ICD-10-CM

## 2021-04-24 DIAGNOSIS — I69354 Hemiplegia and hemiparesis following cerebral infarction affecting left non-dominant side: Secondary | ICD-10-CM

## 2021-04-24 DIAGNOSIS — Z9181 History of falling: Secondary | ICD-10-CM | POA: Diagnosis not present

## 2021-04-24 DIAGNOSIS — M79602 Pain in left arm: Secondary | ICD-10-CM

## 2021-04-24 DIAGNOSIS — M6281 Muscle weakness (generalized): Secondary | ICD-10-CM

## 2021-04-24 DIAGNOSIS — R262 Difficulty in walking, not elsewhere classified: Secondary | ICD-10-CM | POA: Diagnosis not present

## 2021-04-24 DIAGNOSIS — R278 Other lack of coordination: Secondary | ICD-10-CM | POA: Diagnosis not present

## 2021-04-24 DIAGNOSIS — R471 Dysarthria and anarthria: Secondary | ICD-10-CM | POA: Diagnosis not present

## 2021-04-24 DIAGNOSIS — R490 Dysphonia: Secondary | ICD-10-CM | POA: Diagnosis not present

## 2021-04-24 DIAGNOSIS — R2681 Unsteadiness on feet: Secondary | ICD-10-CM

## 2021-04-24 NOTE — Therapy (Signed)
[facility] [address] [place], [place], [address] Phone: [phone]   Fax:  [phone]  Physical Therapy Treatment  Patient Details  Name: Evan MCCRAVY Sr. MRN: [id number] Date of Birth: Jul 28, 1956 Referring Provider (PT): Loura Pardon, MD   Encounter Date: 04/24/2021   PT End of Session - 04/24/21 1527     Visit Number 3    Number of Visits 7    Date for PT Re-Evaluation 05/21/21    Authorization Type [place] Medicaid; 8/25 to 10/6 for 7 total visits    Authorization Time Period 3 of 7 approved visits    Authorization - Visit Number 3    Authorization - Number of Visits 10    Progress Note Due on Visit 10    PT Start Time 1416    PT Stop Time 1501    PT Time Calculation (min) 45 min    Equipment Utilized During Treatment Gait belt    Activity Tolerance Patient tolerated treatment well;Treatment limited secondary to medical complications (Comment)   CVA   Behavior During Therapy [facility] for tasks assessed/performed;Flat affect             Past Medical History:  Diagnosis Date   Carotid arterial disease (HCC)    a. 08/2018 Carotid U/S: min-mod RICA atherosclerosis w/o hemodynamically significant stenosis. Nl LICA.   Diabetes 1.5, managed as type 2 (HCC)    Diastolic dysfunction    a. 08/2018 Echo: EF 65%. No rwma. Gr1 DD. Mild MR.   Diastolic dysfunction    a. 08/2019 Echo: EF 55-60%, Gr1 DD. No rwma. Mild MR. RVSP 37.38mmHg.   Hypercholesterolemia    Hypertension    PAF (paroxysmal atrial fibrillation) (HCC)    a. 10/2019 Event Monitor: PAF; b. CHA2DS2VASc = 5-->Eliquis.   Poorly controlled diabetes mellitus (HCC)    a. 04/2019 A1c 13.8.   Recurrent strokes (HCC)    a. 10/2016 MRI/A: Acute 61mm R thalamic infarct, ? subacute infarct of R corona radiata; b. 08/2017 MRI/A: Acute 33mm lateral L thalamic infarct. Other more remote lacunar infarcts of thalami bilat. Small vessel dzs; c. 08/2018 MRI/A: Acute lacunar infarct of the post  limb of R internal capsule; d. 08/2019 MRI Acute CVA of L paramedian pons adn R cerebellar hemisphere.   Tobacco abuse     Past Surgical History:  Procedure Laterality Date   ESOPHAGOGASTRODUODENOSCOPY (EGD) WITH PROPOFOL N/A 04/26/2019   Procedure: ESOPHAGOGASTRODUODENOSCOPY (EGD) WITH PROPOFOL;  Surgeon: Toney Reil, MD;  Location: [facility] ENDOSCOPY;  Service: Gastroenterology;  Laterality: N/A;    There were no vitals filed for this visit.   Subjective Assessment - 04/24/21 1526     Subjective Pt presents to tx without pain and reports no falls or near falls. Pt states mod quad fatigue after last tx that reduced to baseline within 24 hours.    Patient is accompained by: Family member    Pertinent History Pertinent history and PMH: HTN, uncontrolled DM, HLD, R basal ganglia stroke on 08/2018 and L pointine and R cerebellar stroke 09/07/19, sinus bradycardia, peripheral neuropathy    Limitations Walking;House hold activities    How long can you sit comfortably? WNL    How long can you stand comfortably? 2-3 mins    How long can you walk comfortably? 100 ft    Diagnostic tests N/A    Patient Stated Goals Walk better and increase use of LUE    Currently in Pain? No/denies    Pain  Score 0-No pain    Pain Onset More than a month ago              Treatment     Therapeutic Exercise:   NuStep, L4, Position 10. Verbal and contact cueing for proper LE/UE mechanics during movement.    6'' toe taps 1 min x 3 with gait belt and CGA-Min A with extensive verbal cue to proper step height. 12'' toe taps with Min A with extensive verbal cueing for proper mobility and decreased fall risk.    // [place]: Airex pad step ups x20, WS x20, and marching x20. CGA-MinA.    Amb in clinic 20-30 ft bouts x 4 with CGA-min A without single Lofstrand crutch     Manual Therapy:   Prolonged 10 min light load-long duration L elbow flexion with supination. MPH placed over the L upper arm with one layer  between screen. Skin displayed mild warm and normal response to heat.         PT Long Term Goals - 04/02/21 1126       PT LONG TERM GOAL #1   Title Pt. able to ambulate 500 feet with least assistive device without abnormal change in vitals/BP to improve safe functional mobility.    Baseline amb. with use of single lofstrand crutch and antalgic gait pattern.    Time 7    Period Weeks    Status New    Target Date 05/21/21      PT LONG TERM GOAL #2   Title Pt will improve 5xSTS by 3 seconds to promote greater functional LE power during transfers and reduce need of UE assist.    Baseline 5xSTS: 18.79 sec with UE assist.    Time 7    Period Weeks    Status New    Target Date 05/21/21      PT LONG TERM GOAL #3   Title Pt will be independent with HEP in order to improve  L UE posture by 10 degs to improve reaching/ functional tasks.    Baseline Shoulder flexion: R: WNL L: 0 AROM. 120 PROM   Shoulder abduction: R : WNL L: 90 AROM. 90 PROM    Shoulder IR: WNL bilat   Shoulder ER R: WNL. L: AROM 0 deg. PROM 16  Elbow flexion R WNL. L: deferred  Elbow ext. R WNL. L: AROM -90 PROM -50    Time 7    Period Weeks    Status New    Target Date 05/21/21      PT LONG TERM GOAL #4   Title Pt will improve LLE ROM equal to RLE to faciliate a normallized gait pattern and decrease fall risk.    Baseline Ankle DF (0-35): 7/-11 (PROM does not significantly change measurement) L hamstring mobility: R: WNL L: lacking 25 degrees    Time 7    Period Weeks    Status New    Target Date 05/21/21                   Plan - 04/24/21 1528     Clinical Impression Statement Today's tx was progression with airex mobility and toe taps at 12 '' step and 6'' step without UE assist. Pt requires CGA-Min A at all times to ensure minimal fall risk during mobility. Pt states decreased pain and stiffness in L arm post manual intervention. Pt continues to display mark LE/UE endurance, strength, and ROM deficits  resulting in decreased safe home/community mobiity,  increased pain, and limited access to QOL. Pt. will continue to benefit from skilled physical therapy to progress POC to address remaining deficits to facilitate maximum functional capacity for optimal personal health and wellness for ADLs.    Personal Factors and Comorbidities Age;Comorbidity 3+;Education;Finances;Past/Current Experience;Fitness;Transportation    Comorbidities HTN, uncontrolled DM, HLD, R basal ganglia stroke on 08/2018 and L pointine and R cerebellar stroke 09/07/19, sinus bradycardia, peripheral neuropathy    Examination-Activity Limitations Bathing;Bed Mobility;Dressing;Transfers;Squat;Lift;Locomotion Level;Stairs;Reach Overhead;Carry;Stand;Sit    Examination-Participation Restrictions Community Activity;Interpersonal Relationship;Yard Work    Conservation officer, historic buildings Evolving/Moderate complexity    Clinical Decision Making Moderate    Rehab Potential Fair    PT Frequency 1x / week    PT Duration Other (comment)   7 weeks   PT Treatment/Interventions ADLs/Self Care Home Management;Aquatic Therapy;Electrical Stimulation;Moist Heat;DME Instruction;Gait training;Stair training;Functional mobility training;Therapeutic activities;Therapeutic exercise;Balance training;Neuromuscular re-education;Patient/family education;Orthotic Fit/Training;Dry needling;Manual techniques;Canalith Repostioning;Passive range of motion;Biofeedback;Energy conservation;Joint Manipulations;Vestibular    PT Next Visit Plan Dynamic balance/ gait ex.    PT Home Exercise Plan [id number]    Consulted and Agree with Plan of Care Patient    Family Member Consulted wife             Patient will benefit from skilled therapeutic intervention in order to improve the following deficits and impairments:  Abnormal gait, Decreased coordination, Decreased range of motion, Difficulty walking, Impaired tone, Decreased endurance, Impaired UE functional use,  Decreased activity tolerance, Impaired perceived functional ability, Decreased balance, Impaired flexibility, Decreased cognition, Decreased mobility, Decreased strength, Increased edema, Postural dysfunction, Pain, Cardiopulmonary status limiting activity, Decreased safety awareness, Hypomobility, Improper body mechanics  Visit Diagnosis: Hemiplegia and hemiparesis following cerebral infarction affecting left non-dominant side (HCC)  Difficulty in walking, not elsewhere classified  Unsteadiness on feet  Pain in left arm  Other abnormalities of gait and mobility  Other lack of coordination  Muscle weakness (generalized)  At risk for falls     Problem List Patient Active Problem List   Diagnosis Date Noted   Gastroparesis 02/26/2021   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   Muscle spasm of left lower extremity 02/26/2021   History of multiple strokes 02/26/2021   Type 2 diabetes mellitus with hyperglycemia (HCC) 10/29/2020   Diastolic dysfunction    History of diabetes with ketoacidosis 09/20/2020   CKD (chronic kidney disease) stage 2, GFR 60-89 ml/min 06/27/2020   Monitoring for anticoagulant use 06/27/2020   Lower extremity edema 05/16/2020   Atrial fibrillation (HCC) 10/26/2019   Coagulopathy (HCC) 06/12/2019   Gastroesophageal reflux disease    Hemiplegia and hemiparesis following cerebral infarction affecting left non-dominant side (HCC) 11/25/2018   RBBB 10/15/2018   Dysphagia, post-stroke    Microalbuminuria 07/29/2018   Hyperlipidemia LDL goal <70 02/11/2017   Recurrent strokes (HCC) 11/10/2016   Essential hypertension 11/10/2016   Diabetes mellitus (HCC) 11/10/2016   Tobacco abuse 08/28/2013   Peripheral neuropathy 08/28/2013   Cammie Mcgee, PT, DPT # [id number] Lawernce Ion, SPT 04/24/2021, 4:18 PM  [facility] [address] [place], [place], [address] Phone: [phone]   Fax:  [phone]  Name:  Evan Ano Sr. MRN: [id number] Date of Birth: [birth year]

## 2021-04-29 ENCOUNTER — Other Ambulatory Visit: Payer: Self-pay

## 2021-04-29 ENCOUNTER — Encounter: Payer: Self-pay | Admitting: Physical Therapy

## 2021-04-29 ENCOUNTER — Ambulatory Visit: Payer: Medicaid Other | Admitting: Physical Therapy

## 2021-04-29 DIAGNOSIS — Z9181 History of falling: Secondary | ICD-10-CM | POA: Diagnosis not present

## 2021-04-29 DIAGNOSIS — I69354 Hemiplegia and hemiparesis following cerebral infarction affecting left non-dominant side: Secondary | ICD-10-CM

## 2021-04-29 DIAGNOSIS — R471 Dysarthria and anarthria: Secondary | ICD-10-CM | POA: Diagnosis not present

## 2021-04-29 DIAGNOSIS — R278 Other lack of coordination: Secondary | ICD-10-CM | POA: Diagnosis not present

## 2021-04-29 DIAGNOSIS — M79602 Pain in left arm: Secondary | ICD-10-CM

## 2021-04-29 DIAGNOSIS — R2681 Unsteadiness on feet: Secondary | ICD-10-CM

## 2021-04-29 DIAGNOSIS — I6381 Other cerebral infarction due to occlusion or stenosis of small artery: Secondary | ICD-10-CM

## 2021-04-29 DIAGNOSIS — R262 Difficulty in walking, not elsewhere classified: Secondary | ICD-10-CM

## 2021-04-29 DIAGNOSIS — M6281 Muscle weakness (generalized): Secondary | ICD-10-CM

## 2021-04-29 DIAGNOSIS — R2689 Other abnormalities of gait and mobility: Secondary | ICD-10-CM | POA: Diagnosis not present

## 2021-04-29 DIAGNOSIS — R490 Dysphonia: Secondary | ICD-10-CM | POA: Diagnosis not present

## 2021-04-29 NOTE — Therapy (Signed)
[facility] [address] [place], [place], [address] Phone: [phone]   Fax:  [phone]  Physical Therapy Treatment  Patient Details  Name: Evan Townsend Sr. MRN: [id number] Date of Birth: 03-Dec-1955 Referring Provider (PT): Loura Pardon, MD   Encounter Date: 04/29/2021   PT End of Session - 04/29/21 1424     Visit Number 4    Number of Visits 7    Date for PT Re-Evaluation 05/21/21    Authorization Type [place] Medicaid; 8/25 to 10/6 for 7 total visits    Authorization Time Period 4 of 7 approved visits    Authorization - Visit Number 4    Authorization - Number of Visits 10    Progress Note Due on Visit 10    PT Start Time 1421    PT Stop Time 1505    PT Time Calculation (min) 44 min    Equipment Utilized During Treatment Gait belt    Activity Tolerance Patient tolerated treatment well;Treatment limited secondary to medical complications (Comment)   CVA   Behavior During Therapy [facility] for tasks assessed/performed;Flat affect             Past Medical History:  Diagnosis Date   Carotid arterial disease (HCC)    a. 08/2018 Carotid U/S: min-mod RICA atherosclerosis w/o hemodynamically significant stenosis. Nl LICA.   Diabetes 1.5, managed as type 2 (HCC)    Diastolic dysfunction    a. 08/2018 Echo: EF 65%. No rwma. Gr1 DD. Mild MR.   Diastolic dysfunction    a. 08/2019 Echo: EF 55-60%, Gr1 DD. No rwma. Mild MR. RVSP 37.50mmHg.   Hypercholesterolemia    Hypertension    PAF (paroxysmal atrial fibrillation) (HCC)    a. 10/2019 Event Monitor: PAF; b. CHA2DS2VASc = 5-->Eliquis.   Poorly controlled diabetes mellitus (HCC)    a. 04/2019 A1c 13.8.   Recurrent strokes (HCC)    a. 10/2016 MRI/A: Acute 79mm R thalamic infarct, ? subacute infarct of R corona radiata; b. 08/2017 MRI/A: Acute 67mm lateral L thalamic infarct. Other more remote lacunar infarcts of thalami bilat. Small vessel dzs; c. 08/2018 MRI/A: Acute lacunar infarct of the post  limb of R internal capsule; d. 08/2019 MRI Acute CVA of L paramedian pons adn R cerebellar hemisphere.   Tobacco abuse     Past Surgical History:  Procedure Laterality Date   ESOPHAGOGASTRODUODENOSCOPY (EGD) WITH PROPOFOL N/A 04/26/2019   Procedure: ESOPHAGOGASTRODUODENOSCOPY (EGD) WITH PROPOFOL;  Surgeon: Toney Reil, MD;  Location: [facility] ENDOSCOPY;  Service: Gastroenterology;  Laterality: N/A;    There were no vitals filed for this visit.   Subjective Assessment - 04/29/21 1423     Subjective Pt present to tx without complaints of pain and reports no falls from last tx. Pt states that his blood pressure reading have been better at home.    Patient is accompained by: Family member    Pertinent History Pertinent history and PMH: HTN, uncontrolled DM, HLD, R basal ganglia stroke on 08/2018 and L pointine and R cerebellar stroke 09/07/19, sinus bradycardia, peripheral neuropathy    Limitations Walking;House hold activities    How long can you sit comfortably? WNL    How long can you stand comfortably? 2-3 mins    How long can you walk comfortably? 100 ft    Diagnostic tests N/A    Patient Stated Goals Walk better and increase use of LUE    Currently in Pain? No/denies    Pain Score  0-No pain    Pain Onset More than a month ago              Treatment   Therapeutic Exercise:   NuStep, L4, Position 19. Verbal and contact cueing for proper LE/UE mechanics during movement.    12'' toe taps 1 min x 3 with gait belt and CGA-Min A with extensive verbal cue to proper step height. 1   // Bars: Airex pad step ups x20, WS x20, and marching x20. CGA-MinA.    Amb in clinic 200 ft  with CGA-min A without single Lofstrand crutch. Pt often will catch LLE on ground and requires close contact to ensure no fall.   L hand/finger ext x20 and L shoulder ext with 5 sec hold at end range without contralat assist.      Manual Therapy:   Prolonged 13 min light load-long duration L elbow  flexion with supination. MPH placed over the L upper arm with one layer between screen. Skin displayed mild warm and normal response to heat.        PT Long Term Goals - 04/02/21 1126       PT LONG TERM GOAL #1   Title Pt. able to ambulate 500 feet with least assistive device without abnormal change in vitals/BP to improve safe functional mobility.    Baseline amb. with use of single lofstrand crutch and antalgic gait pattern.    Time 7    Period Weeks    Status New    Target Date 05/21/21      PT LONG TERM GOAL #2   Title Pt will improve 5xSTS by 3 seconds to promote greater functional LE power during transfers and reduce need of UE assist.    Baseline 5xSTS: 18.79 sec with UE assist.    Time 7    Period Weeks    Status New    Target Date 05/21/21      PT LONG TERM GOAL #3   Title Pt will be independent with HEP in order to improve  L UE posture by 10 degs to improve reaching/ functional tasks.    Baseline Shoulder flexion: R: WNL L: 0 AROM. 120 PROM   Shoulder abduction: R : WNL L: 90 AROM. 90 PROM    Shoulder IR: WNL bilat   Shoulder ER R: WNL. L: AROM 0 deg. PROM 16  Elbow flexion R WNL. L: deferred  Elbow ext. R WNL. L: AROM -90 PROM -50    Time 7    Period Weeks    Status New    Target Date 05/21/21      PT LONG TERM GOAL #4   Title Pt will improve LLE ROM equal to RLE to faciliate a normallized gait pattern and decrease fall risk.    Baseline Ankle DF (0-35): 7/-11 (PROM does not significantly change measurement) L hamstring mobility: R: WNL L: lacking 25 degrees    Time 7    Period Weeks    Status New    Target Date 05/21/21                Plan - 04/29/21 1425     Clinical Impression Statement Today's tx was focused on progression of step height with longer walking distance and more toe taps at 12'' compared to 6''. Pt displayed marked fatigue of hip flexor resulting in greater toe catching and fall risk. Pt continues to display mark LE/UE endurance,  strength, and ROM deficits resulting in decreased safe home/community  mobiity, increased pain, and limited access to QOL. Pt. will continue to benefit from skilled physical therapy to progress POC to address remaining deficits to facilitate maximum functional capacity for optimal personal health and wellness for ADLs.    Personal Factors and Comorbidities Age;Comorbidity 3+;Education;Finances;Past/Current Experience;Fitness;Transportation    Comorbidities HTN, uncontrolled DM, HLD, R basal ganglia stroke on 08/2018 and L pointine and R cerebellar stroke 09/07/19, sinus bradycardia, peripheral neuropathy    Examination-Activity Limitations Bathing;Bed Mobility;Dressing;Transfers;Squat;Lift;Locomotion Level;Stairs;Reach Overhead;Carry;Stand;Sit    Examination-Participation Restrictions Community Activity;Interpersonal Relationship;Yard Work    Conservation officer, historic buildings Evolving/Moderate complexity    Clinical Decision Making Moderate    Rehab Potential Fair    PT Frequency 1x / week    PT Duration Other (comment)   7 weeks   PT Treatment/Interventions ADLs/Self Care Home Management;Aquatic Therapy;Electrical Stimulation;Moist Heat;DME Instruction;Gait training;Stair training;Functional mobility training;Therapeutic activities;Therapeutic exercise;Balance training;Neuromuscular re-education;Patient/family education;Orthotic Fit/Training;Dry needling;Manual techniques;Canalith Repostioning;Passive range of motion;Biofeedback;Energy conservation;Joint Manipulations;Vestibular    PT Next Visit Plan Dynamic balance/ gait ex.    PT Home Exercise Plan [email or web address]    Consulted and Agree with Plan of Care Patient    Family Member Consulted wife             Patient will benefit from skilled therapeutic intervention in order to improve the following deficits and impairments:  Abnormal gait, Decreased coordination, Decreased range of motion, Difficulty walking, Impaired tone, Decreased endurance,  Impaired UE functional use, Decreased activity tolerance, Impaired perceived functional ability, Decreased balance, Impaired flexibility, Decreased cognition, Decreased mobility, Decreased strength, Increased edema, Postural dysfunction, Pain, Cardiopulmonary status limiting activity, Decreased safety awareness, Hypomobility, Improper body mechanics  Visit Diagnosis: Hemiplegia and hemiparesis following cerebral infarction affecting left non-dominant side (HCC)  Difficulty in walking, not elsewhere classified  Unsteadiness on feet  Pain in left arm  At risk for falls  Lacunar infarct, acute (HCC)  Muscle weakness (generalized)  Other lack of coordination  Dysphonia  Other abnormalities of gait and mobility  Dysarthria and anarthria     Problem List Patient Active Problem List   Diagnosis Date Noted   Gastroparesis 02/26/2021   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   Muscle spasm of left lower extremity 02/26/2021   History of multiple strokes 02/26/2021   Type 2 diabetes mellitus with hyperglycemia (HCC) 10/29/2020   Diastolic dysfunction    History of diabetes with ketoacidosis 09/20/2020   CKD (chronic kidney disease) stage 2, GFR 60-89 ml/min 06/27/2020   Monitoring for anticoagulant use 06/27/2020   Lower extremity edema 05/16/2020   Atrial fibrillation (HCC) 10/26/2019   Coagulopathy (HCC) 06/12/2019   Gastroesophageal reflux disease    Hemiplegia and hemiparesis following cerebral infarction affecting left non-dominant side (HCC) 11/25/2018   RBBB 10/15/2018   Dysphagia, post-stroke    Microalbuminuria 07/29/2018   Hyperlipidemia LDL goal <70 02/11/2017   Recurrent strokes (HCC) 11/10/2016   Essential hypertension 11/10/2016   Diabetes mellitus (HCC) 11/10/2016   Tobacco abuse 08/28/2013   Peripheral neuropathy 08/28/2013   Cammie Mcgee, PT, DPT # [id number] Lawernce Ion, SPT 04/30/2021, 9:27 AM  [facility] [address] [place], [place], [address] Phone: [phone]   Fax:  [phone]  Name: Sandie Ano Sr. MRN: [id number] Date of Birth: Nov 30, 1955

## 2021-05-01 ENCOUNTER — Other Ambulatory Visit: Payer: Self-pay

## 2021-05-01 ENCOUNTER — Other Ambulatory Visit: Payer: Medicaid Other

## 2021-05-01 DIAGNOSIS — N182 Chronic kidney disease, stage 2 (mild): Secondary | ICD-10-CM

## 2021-05-01 DIAGNOSIS — E1165 Type 2 diabetes mellitus with hyperglycemia: Secondary | ICD-10-CM | POA: Diagnosis not present

## 2021-05-01 DIAGNOSIS — E785 Hyperlipidemia, unspecified: Secondary | ICD-10-CM | POA: Diagnosis not present

## 2021-05-01 DIAGNOSIS — Z794 Long term (current) use of insulin: Secondary | ICD-10-CM | POA: Diagnosis not present

## 2021-05-02 LAB — COMPREHENSIVE METABOLIC PANEL
ALT: 21 IU/L (ref 0–44)
AST: 18 IU/L (ref 0–40)
Albumin/Globulin Ratio: 1.7 (ref 1.2–2.2)
Albumin: 4.5 g/dL (ref 3.8–4.8)
Alkaline Phosphatase: 173 IU/L — ABNORMAL HIGH (ref 44–121)
BUN/Creatinine Ratio: 17 (ref 10–24)
BUN: 23 mg/dL (ref 8–27)
Bilirubin Total: <0.2 mg/dL (ref 0.0–1.2)
CO2: 22 mmol/L (ref 20–29)
Calcium: 9.5 mg/dL (ref 8.6–10.2)
Chloride: 105 mmol/L (ref 96–106)
Creatinine, Ser: 1.37 mg/dL — ABNORMAL HIGH (ref 0.76–1.27)
Globulin, Total: 2.7 g/dL (ref 1.5–4.5)
Glucose: 156 mg/dL — ABNORMAL HIGH (ref 65–99)
Potassium: 4.2 mmol/L (ref 3.5–5.2)
Sodium: 143 mmol/L (ref 134–144)
Total Protein: 7.2 g/dL (ref 6.0–8.5)
eGFR: 58 mL/min/{1.73_m2} — ABNORMAL LOW (ref >59)

## 2021-05-02 LAB — HEMOGLOBIN A1C
Est. average glucose Bld gHb Est-mCnc: 180 mg/dL
Hgb A1c MFr Bld: 7.9 % — ABNORMAL HIGH (ref 4.8–5.6)

## 2021-05-02 LAB — LIPID PANEL
Chol/HDL Ratio: 3.1 ratio (ref 0.0–5.0)
Cholesterol, Total: 131 mg/dL (ref 100–199)
HDL: 42 mg/dL (ref >39)
LDL Chol Calc (NIH): 62 mg/dL (ref 0–99)
Triglycerides: 160 mg/dL — ABNORMAL HIGH (ref 0–149)
VLDL Cholesterol Cal: 27 mg/dL (ref 5–40)

## 2021-05-06 ENCOUNTER — Ambulatory Visit: Payer: Medicaid Other | Admitting: Physical Therapy

## 2021-05-06 ENCOUNTER — Other Ambulatory Visit: Payer: Self-pay | Admitting: Family Medicine

## 2021-05-06 ENCOUNTER — Other Ambulatory Visit: Payer: Self-pay

## 2021-05-06 ENCOUNTER — Encounter: Payer: Self-pay | Admitting: Physical Therapy

## 2021-05-06 DIAGNOSIS — I69354 Hemiplegia and hemiparesis following cerebral infarction affecting left non-dominant side: Secondary | ICD-10-CM

## 2021-05-06 DIAGNOSIS — M6281 Muscle weakness (generalized): Secondary | ICD-10-CM | POA: Diagnosis not present

## 2021-05-06 DIAGNOSIS — R2681 Unsteadiness on feet: Secondary | ICD-10-CM

## 2021-05-06 DIAGNOSIS — M79602 Pain in left arm: Secondary | ICD-10-CM

## 2021-05-06 DIAGNOSIS — Z9181 History of falling: Secondary | ICD-10-CM

## 2021-05-06 DIAGNOSIS — R262 Difficulty in walking, not elsewhere classified: Secondary | ICD-10-CM | POA: Diagnosis not present

## 2021-05-06 DIAGNOSIS — R2689 Other abnormalities of gait and mobility: Secondary | ICD-10-CM

## 2021-05-06 DIAGNOSIS — R471 Dysarthria and anarthria: Secondary | ICD-10-CM | POA: Diagnosis not present

## 2021-05-06 DIAGNOSIS — I6381 Other cerebral infarction due to occlusion or stenosis of small artery: Secondary | ICD-10-CM | POA: Diagnosis not present

## 2021-05-06 DIAGNOSIS — R490 Dysphonia: Secondary | ICD-10-CM | POA: Diagnosis not present

## 2021-05-06 DIAGNOSIS — R278 Other lack of coordination: Secondary | ICD-10-CM

## 2021-05-06 DIAGNOSIS — E1143 Type 2 diabetes mellitus with diabetic autonomic (poly)neuropathy: Secondary | ICD-10-CM

## 2021-05-06 NOTE — Therapy (Signed)
[facility] [address] [place], [place], [address] Phone: [phone]   Fax:  [phone]  Physical Therapy Treatment  Patient Details  Name: Evan MCCATHERN Sr. MRN: [id number] Date of Birth: 11/17/1955 Referring Provider (PT): Loura Pardon, MD   Encounter Date: 05/06/2021   PT End of Session - 05/06/21 1517     Visit Number 5    Number of Visits 7    Date for PT Re-Evaluation 05/21/21    Authorization Type [place] Medicaid; 8/25 to 10/6 for 7 total visits    Authorization Time Period 5 of 7 approved visits    Authorization - Visit Number 5    Authorization - Number of Visits 10    Progress Note Due on Visit 10    PT Start Time 1516    PT Stop Time 1601    PT Time Calculation (min) 45 min    Equipment Utilized During Treatment Gait belt    Activity Tolerance Patient tolerated treatment well;Treatment limited secondary to medical complications (Comment)   CVA   Behavior During Therapy [facility] for tasks assessed/performed;Flat affect             Past Medical History:  Diagnosis Date   Carotid arterial disease (HCC)    a. 08/2018 Carotid U/S: min-mod RICA atherosclerosis w/o hemodynamically significant stenosis. Nl LICA.   Diabetes 1.5, managed as type 2 (HCC)    Diastolic dysfunction    a. 08/2018 Echo: EF 65%. No rwma. Gr1 DD. Mild MR.   Diastolic dysfunction    a. 08/2019 Echo: EF 55-60%, Gr1 DD. No rwma. Mild MR. RVSP 37.76mmHg.   Hypercholesterolemia    Hypertension    PAF (paroxysmal atrial fibrillation) (HCC)    a. 10/2019 Event Monitor: PAF; b. CHA2DS2VASc = 5-->Eliquis.   Poorly controlled diabetes mellitus (HCC)    a. 04/2019 A1c 13.8.   Recurrent strokes (HCC)    a. 10/2016 MRI/A: Acute 29mm R thalamic infarct, ? subacute infarct of R corona radiata; b. 08/2017 MRI/A: Acute 32mm lateral L thalamic infarct. Other more remote lacunar infarcts of thalami bilat. Small vessel dzs; c. 08/2018 MRI/A: Acute lacunar infarct of the post  limb of R internal capsule; d. 08/2019 MRI Acute CVA of L paramedian pons adn R cerebellar hemisphere.   Tobacco abuse     Past Surgical History:  Procedure Laterality Date   ESOPHAGOGASTRODUODENOSCOPY (EGD) WITH PROPOFOL N/A 04/26/2019   Procedure: ESOPHAGOGASTRODUODENOSCOPY (EGD) WITH PROPOFOL;  Surgeon: Toney Reil, MD;  Location[facility] ENDOSCOPY;  Service: Gastroenterology;  Laterality: N/A;    There were no vitals filed for this visit.   Subjective Assessment - 05/06/21 1516     Subjective Pt presents to tx without complaints of pain and reports on falls. Pt states that his L arm is very tight today.  Pt. states he is driving to [place] tomorrow morning with the family.    Patient is accompained by: Family member    Pertinent History Pertinent history and PMH: HTN, uncontrolled DM, HLD, R basal ganglia stroke on 08/2018 and L pointine and R cerebellar stroke 09/07/19, sinus bradycardia, peripheral neuropathy    Limitations Walking;House hold activities    How long can you sit comfortably? WNL    How long can you stand comfortably? 2-3 mins    How long can you walk comfortably? 100 ft    Diagnostic tests N/A    Patient Stated Goals Walk better and increase use of LUE    Currently  in Pain? No/denies    Pain Score 0-No pain    Pain Onset More than a month ago               Treatment:   Therapeutic Exercise:   NuStep, L4, Position 19. Verbal and contact cueing for proper LE/UE mechanics during movement.    12'' toe taps with 3# ankle weights bilat 1 min x 3 with gait belt and CGA-Min A with extensive verbal cue to proper step height. 1   // Bars: lateral toe taps x2 laps with CGA and 3# ankle weights bilat. Verbal and contact cueing to facilitate optimal tissue loading and correct biomechanical alignment to ensure efficient and safe movement.   Amb in clinic 200 ft  with CGA-min A without single Lofstrand crutch and 3# ankle weights bilat. Pt often will catch LLE on  ground and requires close contact to ensure no fall.       Manual Therapy:   Prolonged 11 min light load-long duration L elbow flexion with supination. MPH placed over the L upper arm with one layer between screen. Skin displayed mild warm and normal response to heat.            PT Long Term Goals - 04/02/21 1126       PT LONG TERM GOAL #1   Title Pt. able to ambulate 500 feet with least assistive device without abnormal change in vitals/BP to improve safe functional mobility.    Baseline amb. with use of single lofstrand crutch and antalgic gait pattern.    Time 7    Period Weeks    Status New    Target Date 05/21/21      PT LONG TERM GOAL #2   Title Pt will improve 5xSTS by 3 seconds to promote greater functional LE power during transfers and reduce need of UE assist.    Baseline 5xSTS: 18.79 sec with UE assist.    Time 7    Period Weeks    Status New    Target Date 05/21/21      PT LONG TERM GOAL #3   Title Pt will be independent with HEP in order to improve  L UE posture by 10 degs to improve reaching/ functional tasks.    Baseline Shoulder flexion: R: WNL L: 0 AROM. 120 PROM   Shoulder abduction: R : WNL L: 90 AROM. 90 PROM    Shoulder IR: WNL bilat   Shoulder ER R: WNL. L: AROM 0 deg. PROM 16  Elbow flexion R WNL. L: deferred  Elbow ext. R WNL. L: AROM -90 PROM -50    Time 7    Period Weeks    Status New    Target Date 05/21/21      PT LONG TERM GOAL #4   Title Pt will improve LLE ROM equal to RLE to faciliate a normallized gait pattern and decrease fall risk.    Baseline Ankle DF (0-35): 7/-11 (PROM does not significantly change measurement) L hamstring mobility: R: WNL L: lacking 25 degrees    Time 7    Period Weeks    Status New    Target Date 05/21/21                Plan - 05/06/21 1518     Clinical Impression Statement Today's tx focus was on progression of standing therex with 3# ankle weights. Pt displayed increased fatigue, however, no pain at  the end of tx Pt continues to display mark  LE/UE endurance, strength, and ROM deficits resulting in decreased safe home/community mobiity, increased pain, and limited access to QOL. Pt will continue to benefit from skilled physical therapy to progress POC to address remaining deficits to facilitate maximum functional capacity for optimal personal health and wellness for ADLs.  Pt. has 2 more PT tx. session authorized and PT is considering referring pt. to [facility] clinic for continued tx. session.    Personal Factors and Comorbidities Age;Comorbidity 3+;Education;Finances;Past/Current Experience;Fitness;Transportation    Comorbidities HTN, uncontrolled DM, HLD, R basal ganglia stroke on 08/2018 and L pointine and R cerebellar stroke 09/07/19, sinus bradycardia, peripheral neuropathy    Examination-Activity Limitations Bathing;Bed Mobility;Dressing;Transfers;Squat;Lift;Locomotion Level;Stairs;Reach Overhead;Carry;Stand;Sit    Examination-Participation Restrictions Community Activity;Interpersonal Relationship;Yard Work    Conservation officer, historic buildings Evolving/Moderate complexity    Clinical Decision Making Moderate    Rehab Potential Fair    PT Frequency 1x / week    PT Duration Other (comment)   7 weeks   PT Treatment/Interventions ADLs/Self Care Home Management;Aquatic Therapy;Electrical Stimulation;Moist Heat;DME Instruction;Gait training;Stair training;Functional mobility training;Therapeutic activities;Therapeutic exercise;Balance training;Neuromuscular re-education;Patient/family education;Orthotic Fit/Training;Dry needling;Manual techniques;Canalith Repostioning;Passive range of motion;Biofeedback;Energy conservation;Joint Manipulations;Vestibular    PT Next Visit Plan Dynamic balance/ gait ex.  Discuss trip to [place].  2 more authorized PT tx. sessions.    PT Home Exercise Plan [id number]    Consulted and Agree with Plan of Care Patient    Family Member Consulted wife              Patient will benefit from skilled therapeutic intervention in order to improve the following deficits and impairments:  Abnormal gait, Decreased coordination, Decreased range of motion, Difficulty walking, Impaired tone, Decreased endurance, Impaired UE functional use, Decreased activity tolerance, Impaired perceived functional ability, Decreased balance, Impaired flexibility, Decreased cognition, Decreased mobility, Decreased strength, Increased edema, Postural dysfunction, Pain, Cardiopulmonary status limiting activity, Decreased safety awareness, Hypomobility, Improper body mechanics  Visit Diagnosis: At risk for falls  Dysphonia  Hemiplegia and hemiparesis following cerebral infarction affecting left non-dominant side (HCC)  Difficulty in walking, not elsewhere classified  Lacunar infarct, acute (HCC)  Other abnormalities of gait and mobility  Dysarthria and anarthria  Unsteadiness on feet  Muscle weakness (generalized)  Other lack of coordination  Pain in left arm     Problem List Patient Active Problem List   Diagnosis Date Noted   Gastroparesis 02/26/2021   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   Muscle spasm of left lower extremity 02/26/2021   History of multiple strokes 02/26/2021   Type 2 diabetes mellitus with hyperglycemia (HCC) 10/29/2020   Diastolic dysfunction    History of diabetes with ketoacidosis 09/20/2020   CKD (chronic kidney disease) stage 2, GFR 60-89 ml/min 06/27/2020   Monitoring for anticoagulant use 06/27/2020   Lower extremity edema 05/16/2020   Atrial fibrillation (HCC) 10/26/2019   Coagulopathy (HCC) 06/12/2019   Gastroesophageal reflux disease    Hemiplegia and hemiparesis following cerebral infarction affecting left non-dominant side (HCC) 11/25/2018   RBBB 10/15/2018   Dysphagia, post-stroke    Microalbuminuria 07/29/2018   Hyperlipidemia LDL goal <70 02/11/2017   Recurrent strokes (HCC) 11/10/2016   Essential  hypertension 11/10/2016   Diabetes mellitus (HCC) 11/10/2016   Tobacco abuse 08/28/2013   Peripheral neuropathy 08/28/2013   Cammie Mcgee, PT, DPT # [id number] Lawernce Ion, SPT 05/07/2021, 8:33 AM  [facility] [address] [place], [place], [address] Phone: [phone]   Fax:  [phone]  Name: Evan Ano Sr.  MRN: [id number] Date of Birth: 07-16-1956

## 2021-05-08 ENCOUNTER — Ambulatory Visit: Payer: Medicaid Other | Admitting: Internal Medicine

## 2021-05-13 ENCOUNTER — Encounter: Payer: Medicaid Other | Admitting: Physical Therapy

## 2021-05-16 ENCOUNTER — Other Ambulatory Visit: Payer: Self-pay

## 2021-05-16 ENCOUNTER — Emergency Department: Payer: Medicaid Other

## 2021-05-16 ENCOUNTER — Emergency Department
Admission: EM | Admit: 2021-05-16 | Discharge: 2021-05-16 | Disposition: A | Discharge status: Home / Self Care | Payer: Medicaid Other | Attending: Emergency Medicine | Admitting: Emergency Medicine

## 2021-05-16 DIAGNOSIS — Z79899 Other long term (current) drug therapy: Secondary | ICD-10-CM | POA: Insufficient documentation

## 2021-05-16 DIAGNOSIS — E1110 Type 2 diabetes mellitus with ketoacidosis without coma: Secondary | ICD-10-CM | POA: Diagnosis not present

## 2021-05-16 DIAGNOSIS — K5900 Constipation, unspecified: Secondary | ICD-10-CM | POA: Insufficient documentation

## 2021-05-16 DIAGNOSIS — K219 Gastro-esophageal reflux disease without esophagitis: Secondary | ICD-10-CM | POA: Diagnosis not present

## 2021-05-16 DIAGNOSIS — I6782 Cerebral ischemia: Secondary | ICD-10-CM | POA: Diagnosis not present

## 2021-05-16 DIAGNOSIS — Z794 Long term (current) use of insulin: Secondary | ICD-10-CM | POA: Diagnosis not present

## 2021-05-16 DIAGNOSIS — R42 Dizziness and giddiness: Secondary | ICD-10-CM | POA: Diagnosis not present

## 2021-05-16 DIAGNOSIS — I10 Essential (primary) hypertension: Secondary | ICD-10-CM

## 2021-05-16 DIAGNOSIS — I129 Hypertensive chronic kidney disease with stage 1 through stage 4 chronic kidney disease, or unspecified chronic kidney disease: Secondary | ICD-10-CM | POA: Insufficient documentation

## 2021-05-16 DIAGNOSIS — R1013 Epigastric pain: Secondary | ICD-10-CM | POA: Insufficient documentation

## 2021-05-16 DIAGNOSIS — I2510 Atherosclerotic heart disease of native coronary artery without angina pectoris: Secondary | ICD-10-CM | POA: Insufficient documentation

## 2021-05-16 DIAGNOSIS — Z7901 Long term (current) use of anticoagulants: Secondary | ICD-10-CM | POA: Insufficient documentation

## 2021-05-16 DIAGNOSIS — N182 Chronic kidney disease, stage 2 (mild): Secondary | ICD-10-CM | POA: Diagnosis not present

## 2021-05-16 DIAGNOSIS — Z7984 Long term (current) use of oral hypoglycemic drugs: Secondary | ICD-10-CM | POA: Insufficient documentation

## 2021-05-16 DIAGNOSIS — Z87891 Personal history of nicotine dependence: Secondary | ICD-10-CM | POA: Insufficient documentation

## 2021-05-16 DIAGNOSIS — E1165 Type 2 diabetes mellitus with hyperglycemia: Secondary | ICD-10-CM | POA: Diagnosis not present

## 2021-05-16 DIAGNOSIS — R1010 Upper abdominal pain, unspecified: Secondary | ICD-10-CM | POA: Diagnosis not present

## 2021-05-16 DIAGNOSIS — I700 Atherosclerosis of aorta: Secondary | ICD-10-CM | POA: Diagnosis not present

## 2021-05-16 LAB — COMPREHENSIVE METABOLIC PANEL
ALT: 37 U/L (ref 0–44)
AST: 23 U/L (ref 15–41)
Albumin: 4.4 g/dL (ref 3.5–5.0)
Alkaline Phosphatase: 136 U/L — ABNORMAL HIGH (ref 38–126)
Anion gap: 8 (ref 5–15)
BUN: 20 mg/dL (ref 8–23)
CO2: 24 mmol/L (ref 22–32)
Calcium: 9.2 mg/dL (ref 8.9–10.3)
Chloride: 107 mmol/L (ref 98–111)
Creatinine, Ser: 1.13 mg/dL (ref 0.61–1.24)
GFR, Estimated: >60 mL/min (ref >60)
Glucose, Bld: 189 mg/dL — ABNORMAL HIGH (ref 70–99)
Potassium: 3.5 mmol/L (ref 3.5–5.1)
Sodium: 139 mmol/L (ref 135–145)
Total Bilirubin: 0.6 mg/dL (ref 0.3–1.2)
Total Protein: 8 g/dL (ref 6.5–8.1)

## 2021-05-16 LAB — URINALYSIS, COMPLETE (UACMP) WITH MICROSCOPIC
Bilirubin Urine: NEGATIVE
Glucose, UA: >1000 mg/dL — AB
Ketones, ur: NEGATIVE mg/dL
Leukocytes,Ua: NEGATIVE
Nitrite: NEGATIVE
Protein, ur: >300 mg/dL — AB
Specific Gravity, Urine: 1.02 (ref 1.005–1.030)
Squamous Epithelial / HPF: NONE SEEN (ref 0–5)
WBC, UA: NONE SEEN WBC/hpf (ref 0–5)
pH: 5.5 (ref 5.0–8.0)

## 2021-05-16 LAB — CBC WITH DIFFERENTIAL/PLATELET
Abs Immature Granulocytes: 0.02 10*3/uL (ref 0.00–0.07)
Basophils Absolute: 0 10*3/uL (ref 0.0–0.1)
Basophils Relative: 0 %
Eosinophils Absolute: 0.2 10*3/uL (ref 0.0–0.5)
Eosinophils Relative: 3 %
HCT: 37.7 % — ABNORMAL LOW (ref 39.0–52.0)
Hemoglobin: 12.7 g/dL — ABNORMAL LOW (ref 13.0–17.0)
Immature Granulocytes: 0 %
Lymphocytes Relative: 38 %
Lymphs Abs: 2.2 10*3/uL (ref 0.7–4.0)
MCH: 29.5 pg (ref 26.0–34.0)
MCHC: 33.7 g/dL (ref 30.0–36.0)
MCV: 87.7 fL (ref 80.0–100.0)
Monocytes Absolute: 0.4 10*3/uL (ref 0.1–1.0)
Monocytes Relative: 8 %
Neutro Abs: 2.9 10*3/uL (ref 1.7–7.7)
Neutrophils Relative %: 51 %
Platelets: 258 10*3/uL (ref 150–400)
RBC: 4.3 MIL/uL (ref 4.22–5.81)
RDW: 15.6 % — ABNORMAL HIGH (ref 11.5–15.5)
WBC: 5.7 10*3/uL (ref 4.0–10.5)
nRBC: 0 % (ref 0.0–0.2)

## 2021-05-16 LAB — LIPASE, BLOOD: Lipase: 26 U/L (ref 11–51)

## 2021-05-16 LAB — TROPONIN I (HIGH SENSITIVITY): Troponin I (High Sensitivity): 8 ng/L (ref <18)

## 2021-05-16 MED ORDER — ONDANSETRON HCL 4 MG/2ML IJ SOLN
4.0000 mg | Freq: Once | INTRAMUSCULAR | Status: AC
  Administered 2021-05-16: 4 mg via INTRAVENOUS
  Filled 2021-05-16: qty 2

## 2021-05-16 MED ORDER — POLYETHYLENE GLYCOL 3350 17 GM/SCOOP PO POWD: ORAL | 0 refills | Status: DC

## 2021-05-16 MED ORDER — CLONIDINE HCL 0.1 MG PO TABS
0.1000 mg | ORAL_TABLET | Freq: Once | ORAL | Status: AC
  Administered 2021-05-16: 0.1 mg via ORAL
  Filled 2021-05-16: qty 1

## 2021-05-16 MED ORDER — SENNOSIDES-DOCUSATE SODIUM 8.6-50 MG PO TABS: 2.0000 | ORAL_TABLET | Freq: Two times a day (BID) | ORAL | 0 refills | Status: DC

## 2021-05-16 MED ORDER — AMLODIPINE BESYLATE 5 MG PO TABS: 5.0000 mg | ORAL_TABLET | Freq: Every day | ORAL | 0 refills | Status: DC

## 2021-05-16 MED ORDER — AMLODIPINE BESYLATE 5 MG PO TABS
10.0000 mg | ORAL_TABLET | Freq: Once | ORAL | Status: AC
  Administered 2021-05-16: 10 mg via ORAL
  Filled 2021-05-16: qty 2

## 2021-05-16 MED ORDER — IOHEXOL 350 MG/ML SOLN
80.0000 mL | Freq: Once | INTRAVENOUS | Status: AC | PRN
  Administered 2021-05-16: 80 mL via INTRAVENOUS
  Filled 2021-05-16: qty 80

## 2021-05-16 NOTE — ED Provider Notes (Signed)
[facility] Emergency Department Provider Note  ____________________________________________  Time seen: Approximately 2:40 PM  I have reviewed the triage vital signs and the nursing notes.   HISTORY  Chief Complaint Abdominal Pain    HPI Evan MATTERS Sr. is a 65 y.o. male with a history of hypertension, paroxysmal atrial fibrillation, diabetes, recurrent strokes with persistent left-sided deficits who comes the ED complaining of upper abdominal pain that started yesterday, associated with nausea, no vomiting or diarrhea.  He does endorse constipation.  Pain is waxing and waning, nonradiating, no aggravating or alleviating factors.  He also reports chronic left hand pain which is not related to his abdominal pain complaints today.    Past Medical History:  Diagnosis Date   Carotid arterial disease ([place])    a. 08/2018 Carotid U/S: min-mod RICA atherosclerosis w/o hemodynamically significant stenosis. Nl LICA.   Diabetes 1.5, managed as type 2 ([place])    Diastolic dysfunction    a. 08/2018 Echo: EF 65%. No rwma. Gr1 DD. Mild MR.   Diastolic dysfunction    a. 08/2019 Echo: EF 55-60%, Gr1 DD. No rwma. Mild MR. RVSP 37.65mHg.   Hypercholesterolemia    Hypertension    PAF (paroxysmal atrial fibrillation) ([place]    a. 10/2019 Event Monitor: PAF; b. CHA2DS2VASc = 5-->Eliquis.   Poorly controlled diabetes mellitus ([place]    a. 04/2019 A1c 13.8.   Recurrent strokes ([place]    a. 10/2016 MRI/A: Acute 550mR thalamic infarct, ? subacute infarct of R corona radiata; b. 08/2017 MRI/A: Acute 5m60materal L thalamic infarct. Other more remote lacunar infarcts of thalami bilat. Small vessel dzs; c. 08/2018 MRI/A: Acute lacunar infarct of the post limb of R internal capsule; d. 08/2019 MRI Acute CVA of L paramedian pons adn R cerebellar hemisphere.   Tobacco abuse      Patient Active Problem List   Diagnosis Date Noted   Gastroparesis 02/26/2021   Moderate major depression  (HCCLa Crosse7/13/2022   Muscle spasm of left lower extremity 02/26/2021   History of multiple strokes 02/26/2021   Type 2 diabetes mellitus with hyperglycemia (HCCDuPage3/85/46/2703Diastolic dysfunction    History of diabetes with ketoacidosis 09/20/2020   CKD (chronic kidney disease) stage 2, GFR 60-89 ml/min 06/27/2020   Monitoring for anticoagulant use 06/27/2020   Lower extremity edema 05/16/2020   Atrial fibrillation (HCCTanaina3/06/2020   Coagulopathy (HCCRotan0/26/2020   Gastroesophageal reflux disease    Hemiplegia and hemiparesis following cerebral infarction affecting left non-dominant side (HCCScotia4/05/2019   RBBB 10/15/2018   Dysphagia, post-stroke    Microalbuminuria 07/29/2018   Hyperlipidemia LDL goal <70 02/11/2017   Recurrent strokes (HCCAl[place]3/27/2018   Essential hypertension 11/10/2016   Diabetes mellitus (HCCSewaren3/27/2018   Tobacco abuse 08/28/2013   Peripheral neuropathy 08/28/2013     Past Surgical History:  Procedure Laterality Date   ESOPHAGOGASTRODUODENOSCOPY (EGD) WITH PROPOFOL N/A 04/26/2019   Procedure: ESOPHAGOGASTRODUODENOSCOPY (EGD) WITH PROPOFOL;  Surgeon: VanLin LandsmanD;  [facility] [place]ology;  Laterality: N/A;     Prior to Admission medications   Medication Sig Start Date End Date Taking? Authorizing Provider  amLODipine (NORVASC) 5 MG tablet Take 1 tablet (5 mg total) by mouth daily. 05/16/21 08/14/21 Yes StaCarrie MewD  polyethylene glycol powder (GLPromise [facility]7 GM/SCOOP powder 1 cap full in a full glass of water, two times a day for 3 days. 05/16/21  Yes StaCarrie MewD  senna-docusate (SENOKOT-S) 8.6-50 MG tablet Take 2  tablets by mouth 2 (two) times daily. Jun 13, 2021  Yes Carrie Mew, MD  ACCU-CHEK GUIDE test strip Check fsbs once daily 12/24/20   Steele Sizer, MD  Accu-Chek Softclix Lancets lancets SMARTSIG:Topical 12/24/20   Steele Sizer, MD  albuterol (VENTOLIN HFA) 108 (90 Base) MCG/ACT  inhaler Inhale 2 puffs into the lungs every 6 (six) hours as needed for wheezing or shortness of breath. 02/26/21   Vigg, Avanti, MD  apixaban (ELIQUIS) 5 MG TABS tablet Take 1 tablet (5 mg total) by mouth 2 (two) times daily. 03/20/21   Vigg, Avanti, MD  atorvastatin (LIPITOR) 80 MG tablet Take 1 tablet (80 mg total) by mouth daily. 03/20/21   Vigg, Avanti, MD  baclofen (LIORESAL) 10 MG tablet Take 1 tablet (10 mg total) by mouth 3 (three) times daily. 03/20/21   Vigg, Avanti, MD  Blood Glucose Monitoring Suppl (FIFTY50 GLUCOSE METER 2.0) w/Device KIT Use as instructed 05/05/18   [provider]  Blood Glucose Monitoring Suppl KIT Accucheck Guide with lancets and test strips #100 with one refill.  Test blood sugar twice daily. DX Code E11.65 ; Z79.4 07/09/20   Towanda Malkin, MD  Continuous Blood Gluc Receiver (FREESTYLE LIBRE 2 READER) DEVI 1 each by Does not apply route daily. 09/26/20   Steele Sizer, MD  Continuous Blood Gluc Sensor (FREESTYLE LIBRE 2 SENSOR) MISC 1 each by Does not apply route every 14 (fourteen) days. 09/26/20   Steele Sizer, MD  dapagliflozin propanediol (FARXIGA) 10 MG TABS tablet Take 1 tablet (10 mg total) by mouth daily before breakfast. In place of glipizide 03/20/21   Vigg, Avanti, MD  Insulin Pen Needle 32G X 6 MM MISC Daily 10/24/20   Steele Sizer, MD  lisinopril (ZESTRIL) 40 MG tablet Take 1 tablet (40 mg total) by mouth daily. 08/15/20   End, Harrell Gave, MD  metoCLOPramide (REGLAN) 5 MG tablet TAKE 1 TABLET BY MOUTH 3 TIMES DAILY BEFORE MEALS 05/06/21   Vigg, Avanti, MD  metoprolol succinate (TOPROL-XL) 25 MG 24 hr tablet Take 1 tablet (25 mg total) by mouth daily. 02/26/21   Charlynne Cousins, MD  Misc. Devices MISC One pair of Compression stockings  Hemiplegia and hemiparesis following cerebral infarction affecting left non-dominant side ([place])  - Primary Codes: Z61.096 Lower extremity edema  Codes: R60.0 Type 2 diabetes mellitus with hyperglycemia, with  long-term current use of insulin ([place])  Codes: E11.65, Z79.4 06-13-2020   Towanda Malkin, MD  omeprazole (PRILOSEC) 40 MG capsule TAKE 1 CAPSULE BY MOUTH IN THE MORNING AND AT BEDTIME 03/21/21   Vigg, Avanti, MD  sitaGLIPtin (JANUVIA) 50 MG tablet Take 1 tablet (50 mg total) by mouth daily. 02/26/21   Charlynne Cousins, MD     Allergies Patient has no known allergies.   Family History  Problem Relation Age of Onset   Hypertension Mother    Stroke Mother        died @ age 41   Hypertension Father    Diabetes Father    Heart attack Father        died @ 78    Social History Social History   Tobacco Use   Smoking status: Former    Packs/day: 1.00    Years: 38.00    Pack years: 38.00    Types: Cigarettes    Start date: 1982   Smokeless tobacco: Never  Vaping Use   Vaping Use: Never used  Substance Use Topics   Alcohol use: Not Currently    Alcohol/week: 1.0  standard drink    Types: 1 Cans of beer per week    Comment: previously drank but nothing in 1-2 yrs (08/2019).   Drug use: Not Currently    Types: Cocaine, Marijuana    Comment: prev used cocaine/marijuana but none x 1-2 yrs (08/2019).    Review of Systems  Constitutional:   No fever or chills.  ENT:   No sore throat. No rhinorrhea. Cardiovascular:   No chest pain or syncope. Respiratory:   No dyspnea or cough. Gastrointestinal: Above for abdominal pain without vomiting and diarrhea.  Musculoskeletal:   Negative for focal pain or swelling All other systems reviewed and are negative except as documented above in ROS and HPI.  ____________________________________________   PHYSICAL EXAM:  VITAL SIGNS: ED Triage Vitals  Enc Vitals Group     BP 05/16/21 1059 (!) 201/100     Pulse Rate 05/16/21 1059 77     Resp 05/16/21 1059 18     Temp 05/16/21 1059 97.8 F (36.6 C)     Temp Source 05/16/21 1059 Oral     SpO2 05/16/21 1059 98 %     Weight 05/16/21 1059 165 lb (74.8 kg)     Height 05/16/21 1059 _0   (1.651 m)     Head Circumference --      Peak Flow --      Pain Score 05/16/21 1118 8     Pain Loc --      Pain Edu? --      Excl. in [place]? --     Vital signs reviewed, nursing assessments reviewed.   Constitutional:   Alert and oriented. Non-toxic appearance. Eyes:   Conjunctivae are normal. EOMI. PERRL. ENT      Head:   Normocephalic and atraumatic.      Nose:   Wearing a mask.      Mouth/Throat:   Wearing a mask.      Neck:   No meningismus. Full ROM. Hematological/Lymphatic/Immunilogical:   No cervical lymphadenopathy. Cardiovascular:   RRR. Symmetric bilateral radial and DP pulses.  No murmurs. Cap refill less than 2 seconds. Respiratory:   Normal respiratory effort without tachypnea/retractions. Breath sounds are clear and equal bilaterally. No wheezes/rales/rhonchi. Gastrointestinal:   Soft with epigastric tenderness. Non distended. There is no CVA tenderness.  No rebound, rigidity, or guarding. Genitourinary:   deferred Musculoskeletal:   Normal range of motion in all extremities. No joint effusions.  No lower extremity tenderness.  No edema. Neurologic:   Chronic dysarthric speech.  Normal language Impaired motor function of left upper extremity and left lower extremity. No acute focal neurologic deficits are appreciated.  Skin:    Skin is warm, dry and intact. No rash noted.  No petechiae, purpura, or bullae.  ____________________________________________    LABS (pertinent positives/negatives) (all labs ordered are listed, but only abnormal results are displayed) Labs Reviewed  URINALYSIS, COMPLETE (UACMP) WITH MICROSCOPIC - Abnormal; Notable for the following components:      Result Value   Glucose, UA >1,000 (*)    Hgb urine dipstick SMALL (*)    Protein, ur >300 (*)    Bacteria, UA RARE (*)    All other components within normal limits  COMPREHENSIVE METABOLIC PANEL - Abnormal; Notable for the following components:   Glucose, Bld 189 (*)    Alkaline Phosphatase  136 (*)    All other components within normal limits  CBC WITH DIFFERENTIAL/PLATELET - Abnormal; Notable for the following components:   Hemoglobin 12.7 (*)  HCT 37.7 (*)    RDW 15.6 (*)    All other components within normal limits  LIPASE, BLOOD  TROPONIN I (HIGH SENSITIVITY)   ____________________________________________   EKG Interpreted by me Normal sinus rhythm rate of 75.  Left axis, normal/block.  Normal ST segments and T waves.  No ischemic changes.   ____________________________________________    RADIOLOGY  CT HEAD WO CONTRAST (5MM)  Result Date: 05/16/2021 CLINICAL DATA:  65 year old male with history of dizziness. Possible stroke. EXAM: CT HEAD WITHOUT CONTRAST TECHNIQUE: Contiguous axial images were obtained from the base of the skull through the vertex without intravenous contrast. COMPARISON:  Head CT 08/05/2020. FINDINGS: Brain: Patchy areas of decreased attenuation are noted throughout the deep and periventricular white matter of the cerebral hemispheres bilaterally, compatible with chronic microvascular ischemic disease. No evidence of acute infarction, hemorrhage, hydrocephalus, extra-axial collection or mass lesion/mass effect. Vascular: No hyperdense vessel or unexpected calcification. Skull: Normal. Negative for fracture or focal lesion. Sinuses/Orbits: No acute finding. Other: None. IMPRESSION: 1. No acute intracranial abnormalities. 2. Mild chronic microvascular ischemic changes in the cerebral white matter again noted. Electronically Signed   By: Vinnie Langton M.D.   On: 05/16/2021 15:31   CT ABDOMEN PELVIS W CONTRAST  Result Date: 05/16/2021 CLINICAL DATA:  Epigastric pain EXAM: CT ABDOMEN AND PELVIS WITH CONTRAST TECHNIQUE: Multidetector CT imaging of the abdomen and pelvis was performed using the standard protocol following bolus administration of intravenous contrast. CONTRAST:  19m OMNIPAQUE IOHEXOL 350 MG/ML SOLN COMPARISON:  09/20/2020 FINDINGS:  Lower chest: No acute abnormality. Hepatobiliary: No solid liver abnormality is seen. No gallstones, gallbladder wall thickening, or biliary dilatation. Pancreas: Unremarkable. No pancreatic ductal dilatation or surrounding inflammatory changes. Spleen: Normal in size without significant abnormality. Adrenals/Urinary Tract: Adrenal glands are unremarkable. Kidneys are normal, without renal calculi, solid lesion, or hydronephrosis. Bladder is unremarkable. Stomach/Bowel: Stomach is within normal limits. Appendix appears normal. No evidence of bowel wall thickening, distention, or inflammatory changes. Large burden of stool throughout the colon and rectum. Vascular/Lymphatic: Aortic atherosclerosis. No enlarged abdominal or pelvic lymph nodes. Reproductive: Prostatomegaly with median lobe hypertrophy. Other: No abdominal wall hernia or abnormality. No abdominopelvic ascites. Musculoskeletal: No acute or significant osseous findings. IMPRESSION: 1. No acute CT findings of the abdomen or pelvis to explain epigastric pain. 2. Large burden of stool throughout the colon and rectum. 3. Prostatomegaly. Aortic Atherosclerosis (ICD10-I70.0). Electronically Signed   By: ADelanna AhmadiM.D.   On: 05/16/2021 15:31    ____________________________________________   PROCEDURES Procedures  ____________________________________________  DIFFERENTIAL DIAGNOSIS   Pancreatitis, biliary disease, gastritis, viral illness, dehydration, constipation, colitis, bowel obstruction  CLINICAL IMPRESSION / ASSESSMENT AND PLAN / ED COURSE  Medications ordered in the ED: Medications  amLODipine (NORVASC) tablet 10 mg (has no administration in time range)  cloNIDine (CATAPRES) tablet 0.1 mg (0.1 mg Oral Given 05/16/21 1415)  ondansetron (ZOFRAN) injection 4 mg (4 mg Intravenous Given 05/16/21 1413)  iohexol (OMNIPAQUE) 350 MG/ML injection 80 mL (80 mLs Intravenous Contrast Given 05/16/21 1452)    Pertinent labs & imaging results  that were available during my care of the patient were reviewed by me and considered in my medical decision making (see chart for details).  DMATIS MONNIERSr. was evaluated in Emergency Department on 05/16/2021 for the symptoms described in the history of present illness. He was evaluated in the context of the global COVID-19 pandemic, which necessitated consideration that the patient might be at risk for infection with the SARS-CoV-2 virus  that causes COVID-19. Institutional protocols and algorithms that pertain to the evaluation of patients at risk for COVID-19 are in a state of rapid change based on information released by regulatory bodies including the CDC and federal and state organizations. These policies and algorithms were followed during the patient's care in the ED.   Patient presents with upper abdominal pain nausea.  He has some tenderness on exam as well.  Due to his significant comorbidities, will obtain CT scan of the abdomen pelvis to further evaluate.  Clinical Course as of 05/16/21 1542  Fri May 16, 2021  1541 CT head unremarkable.  CT abdomen shows extensive constipation, no acute issues.  The constipation explains his abdominal discomfort.  Will prescribe Senokot and MiraLAX.  Add amlodipine due to his persistent hypertension, recommend close follow-up with PCP. [PS]    Clinical Course User Index [PS] Carrie Mew, MD     ____________________________________________   FINAL CLINICAL IMPRESSION(S) / ED DIAGNOSES    Final diagnoses:  Epigastric pain  Constipation, unspecified constipation type  Uncontrolled hypertension     ED Discharge Orders          Ordered    senna-docusate (SENOKOT-S) 8.6-50 MG tablet  2 times daily        05/16/21 1539    polyethylene glycol powder (GLYCOLAX/MIRALAX) 17 GM/SCOOP powder        05/16/21 1539    amLODipine (NORVASC) 5 MG tablet  Daily        05/16/21 1539            Portions of this note were generated with  dragon dictation software. Dictation errors may occur despite best attempts at proofreading.    Carrie Mew, MD 05/16/21 [phone]

## 2021-05-16 NOTE — Discharge Instructions (Signed)
Your lab tests and CT scans were okay today.  The imaging of your abdomen does show extensive constipation.  Take Senokot and MiraLAX as prescribed to help relieve this, which should improve your abdominal pain.  Your blood pressure was severely elevated today, approximately 200/100.  Continue taking your current blood pressure medicines, and start taking amlodipine 5 mg once a day as well.  Follow-up with your primary care doctor in 1 week for blood pressure recheck and medication adjustment.

## 2021-05-16 NOTE — ED Notes (Signed)
See triage note  presents with some abd pain   states pain started last pm   no fever but states did have some n/v

## 2021-05-16 NOTE — ED Notes (Signed)
Notified EDP Stafford via secure chat that pt's BP remains elevated at 208/97. Awaiting orders.

## 2021-05-16 NOTE — ED Triage Notes (Signed)
Pt here with abd pain. Pt denies new meds or food. Pt states pain is centered and radiates to his right arm. Pt endorses nausea but denies V/D.

## 2021-05-16 NOTE — ED Provider Notes (Signed)
Emergency Medicine Provider Triage Evaluation Note  Evan MCLEISH Sr., a 65 y.o. male  was evaluated in triage.  Pt with a history of strokes, complains of epigastric abdominal pain. He notes onset last night of crampy abdominal pain. He denies NVD, and pain is note improved with eating. Pain makes his left arm feel like it is drawing up.   Review of Systems  Positive: Epigastric abd pain Negative: NVD  Physical Exam  BP (!) 201/100 (BP Location: Right Arm)   Pulse 77   Temp 97.8 F (36.6 C) (Oral)   Resp 18   Ht 5\' 5"  (1.651 m)   Wt 74.8 kg   SpO2 98%   BMI 27.46 kg/m  Gen:   Awake, no distress  NAD Resp:  Normal effort CTA MSK:   Moves extremities without difficulty LUE weakness at baseline Other:  ABD: soft, nontender  Medical Decision Making  Medically screening exam initiated at 11:11 AM.  Appropriate orders placed.  Sr. was informed that the remainder of the evaluation will be completed by another provider, this initial triage assessment does not replace that evaluation, and the importance of remaining in the ED until their evaluation is complete.  Patient with ED evaluation of epigastric abd pain.   Sandie Ano, PA-C 05/16/21 1122    05/18/21, MD 05/16/21 [phone]

## 2021-05-19 ENCOUNTER — Telehealth: Payer: Self-pay

## 2021-05-19 NOTE — Telephone Encounter (Signed)
Transition Care Management Unsuccessful Follow-up Telephone Call  Date of discharge and from where:  05/16/2021-[facility]  Attempts:  1st Attempt  Reason for unsuccessful TCM follow-up call:  Left voice message

## 2021-05-20 ENCOUNTER — Other Ambulatory Visit: Payer: Self-pay

## 2021-05-20 ENCOUNTER — Encounter: Payer: Self-pay | Admitting: Physical Therapy

## 2021-05-20 ENCOUNTER — Ambulatory Visit: Payer: Medicaid Other | Attending: Internal Medicine | Admitting: Physical Therapy

## 2021-05-20 DIAGNOSIS — R471 Dysarthria and anarthria: Secondary | ICD-10-CM | POA: Insufficient documentation

## 2021-05-20 DIAGNOSIS — I69354 Hemiplegia and hemiparesis following cerebral infarction affecting left non-dominant side: Secondary | ICD-10-CM | POA: Diagnosis not present

## 2021-05-20 DIAGNOSIS — Z9181 History of falling: Secondary | ICD-10-CM | POA: Diagnosis not present

## 2021-05-20 DIAGNOSIS — R278 Other lack of coordination: Secondary | ICD-10-CM | POA: Insufficient documentation

## 2021-05-20 DIAGNOSIS — R262 Difficulty in walking, not elsewhere classified: Secondary | ICD-10-CM | POA: Insufficient documentation

## 2021-05-20 DIAGNOSIS — R490 Dysphonia: Secondary | ICD-10-CM | POA: Insufficient documentation

## 2021-05-20 DIAGNOSIS — M79602 Pain in left arm: Secondary | ICD-10-CM | POA: Insufficient documentation

## 2021-05-20 DIAGNOSIS — R2689 Other abnormalities of gait and mobility: Secondary | ICD-10-CM | POA: Insufficient documentation

## 2021-05-20 DIAGNOSIS — M6281 Muscle weakness (generalized): Secondary | ICD-10-CM | POA: Insufficient documentation

## 2021-05-20 DIAGNOSIS — R2681 Unsteadiness on feet: Secondary | ICD-10-CM | POA: Diagnosis not present

## 2021-05-20 DIAGNOSIS — I6381 Other cerebral infarction due to occlusion or stenosis of small artery: Secondary | ICD-10-CM | POA: Diagnosis not present

## 2021-05-20 NOTE — Therapy (Deleted)
[facility] [address] [place], [place], [address] Phone: [phone]   Fax:  [phone]  Physical Therapy Treatment  Patient Details  Name: Evan BOLGER Sr. MRN: [id number] Date of Birth: [birth year] Referring Provider (PT): Loura Pardon, MD   Encounter Date: 05/20/2021   PT End of Session - 05/20/21 1515     Visit Number 6    Number of Visits 7    Date for PT Re-Evaluation 05/21/21    Authorization Type [place] Medicaid; 8/25 to 10/6 for 7 total visits    Authorization Time Period 5 of 7 approved visits    Authorization - Visit Number 6    Authorization - Number of Visits 10    Progress Note Due on Visit 10    PT Start Time 1502    PT Stop Time 1545    PT Time Calculation (min) 43 min    Equipment Utilized During Treatment Gait belt    Activity Tolerance Patient tolerated treatment well;Treatment limited secondary to medical complications (Comment)   CVA   Behavior During Therapy [facility] for tasks assessed/performed;Flat affect             Past Medical History:  Diagnosis Date   Carotid arterial disease (HCC)    a. 08/2018 Carotid U/S: min-mod RICA atherosclerosis w/o hemodynamically significant stenosis. Nl LICA.   Diabetes 1.5, managed as type 2 (HCC)    Diastolic dysfunction    a. 08/2018 Echo: EF 65%. No rwma. Gr1 DD. Mild MR.   Diastolic dysfunction    a. 08/2019 Echo: EF 55-60%, Gr1 DD. No rwma. Mild MR. RVSP 37.23mmHg.   Hypercholesterolemia    Hypertension    PAF (paroxysmal atrial fibrillation) (HCC)    a. 10/2019 Event Monitor: PAF; b. CHA2DS2VASc = 5-->Eliquis.   Poorly controlled diabetes mellitus (HCC)    a. 04/2019 A1c 13.8.   Recurrent strokes (HCC)    a. 10/2016 MRI/A: Acute 57mm R thalamic infarct, ? subacute infarct of R corona radiata; b. 08/2017 MRI/A: Acute 38mm lateral L thalamic infarct. Other more remote lacunar infarcts of thalami bilat. Small vessel dzs; c. 08/2018 MRI/A: Acute lacunar infarct of the post  limb of R internal capsule; d. 08/2019 MRI Acute CVA of L paramedian pons adn R cerebellar hemisphere.   Tobacco abuse     Past Surgical History:  Procedure Laterality Date   ESOPHAGOGASTRODUODENOSCOPY (EGD) WITH PROPOFOL N/A 04/26/2019   Procedure: ESOPHAGOGASTRODUODENOSCOPY (EGD) WITH PROPOFOL;  Surgeon: Toney Reil, MD;  [facility];  Service: Gastroenterology;  Laterality: N/A;    There were no vitals filed for this visit.                                    PT Long Term Goals - 04/02/21 1126       PT LONG TERM GOAL #1   Title Pt. able to ambulate 500 feet with least assistive device without abnormal change in vitals/BP to improve safe functional mobility.    Baseline amb. with use of single lofstrand crutch and antalgic gait pattern.    Time 7    Period Weeks    Status New    Target Date 05/21/21      PT LONG TERM GOAL #2   Title Pt will improve 5xSTS by 3 seconds to promote greater functional LE power during transfers and reduce need of UE assist.  Baseline 5xSTS: 18.79 sec with UE assist.    Time 7    Period Weeks    Status New    Target Date 05/21/21      PT LONG TERM GOAL #3   Title Pt will be independent with HEP in order to improve  L UE posture by 10 degs to improve reaching/ functional tasks.    Baseline Shoulder flexion: R: WNL L: 0 AROM. 120 PROM   Shoulder abduction: R : WNL L: 90 AROM. 90 PROM    Shoulder IR: WNL bilat   Shoulder ER R: WNL. L: AROM 0 deg. PROM 16  Elbow flexion R WNL. L: deferred  Elbow ext. R WNL. L: AROM -90 PROM -50    Time 7    Period Weeks    Status New    Target Date 05/21/21      PT LONG TERM GOAL #4   Title Pt will improve LLE ROM equal to RLE to faciliate a normallized gait pattern and decrease fall risk.    Baseline Ankle DF (0-35): 7/-11 (PROM does not significantly change measurement) L hamstring mobility: R: WNL L: lacking 25 degrees    Time 7    Period Weeks    Status New     Target Date 05/21/21                    Patient will benefit from skilled therapeutic intervention in order to improve the following deficits and impairments:     Visit Diagnosis: At risk for falls  Dysphonia  Hemiplegia and hemiparesis following cerebral infarction affecting left non-dominant side (HCC)  Difficulty in walking, not elsewhere classified  Lacunar infarct, acute (HCC)  Other abnormalities of gait and mobility  Dysarthria and anarthria  Unsteadiness on feet  Muscle weakness (generalized)  Other lack of coordination  Pain in left arm     Problem List Patient Active Problem List   Diagnosis Date Noted   Gastroparesis 02/26/2021   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   Muscle spasm of left lower extremity 02/26/2021   History of multiple strokes 02/26/2021   Type 2 diabetes mellitus with hyperglycemia (HCC) 10/29/2020   Diastolic dysfunction    History of diabetes with ketoacidosis 09/20/2020   CKD (chronic kidney disease) stage 2, GFR 60-89 ml/min 06/27/2020   Monitoring for anticoagulant use 06/27/2020   Lower extremity edema 05/16/2020   Atrial fibrillation (HCC) 10/26/2019   Coagulopathy (HCC) 06/12/2019   Gastroesophageal reflux disease    Hemiplegia and hemiparesis following cerebral infarction affecting left non-dominant side (HCC) 11/25/2018   RBBB 10/15/2018   Dysphagia, post-stroke    Microalbuminuria 07/29/2018   Hyperlipidemia LDL goal <70 02/11/2017   Recurrent strokes (HCC) 11/10/2016   Essential hypertension 11/10/2016   Diabetes mellitus (HCC) 11/10/2016   Tobacco abuse 08/28/2013   Peripheral neuropathy 08/28/2013    Gertie Exon, PT 05/20/2021, 3:16 PM  [facility] [address] [place], [place], [address] Phone: [phone]   Fax:  [phone]  Name: Evan Ano Sr. MRN: [id number] Date of Birth: May 06, 1956

## 2021-05-20 NOTE — Telephone Encounter (Signed)
Transition Care Management Unsuccessful Follow-up Telephone Call  Date of discharge and from where:  [place]  Attempts:  2nd Attempt  Reason for unsuccessful TCM follow-up call:  Left voice message    

## 2021-05-21 NOTE — Telephone Encounter (Signed)
Transition Care Management Unsuccessful Follow-up Telephone Call  Date of discharge and from where:  05/16/2021 from [facility]  Attempts:  3rd Attempt  Reason for unsuccessful TCM follow-up call:  Unable to reach patient

## 2021-05-21 NOTE — Therapy (Signed)
[facility] [address] [place], [place], [address] Phone: [phone]   Fax:  [phone]  Physical Therapy Treatment  Patient Details  Name: Evan SWIRE Sr. MRN: [id number] Date of Birth: January 28, 1956 Referring Provider (PT): Loura Pardon, MD   Encounter Date: 05/20/2021   PT End of Session - 05/20/21 1515     Visit Number 6    Number of Visits 7    Date for PT Re-Evaluation 05/21/21    Authorization Type [place] Medicaid; 8/25 to 10/6 for 7 total visits    Authorization Time Period 5 of 7 approved visits    Authorization - Visit Number 6    Authorization - Number of Visits 10    Progress Note Due on Visit 10    PT Start Time 1502    PT Stop Time 1545    PT Time Calculation (min) 43 min    Equipment Utilized During Treatment Gait belt    Activity Tolerance Patient tolerated treatment well;Treatment limited secondary to medical complications (Comment)   CVA   Behavior During Therapy [facility] for tasks assessed/performed;Flat affect             Past Medical History:  Diagnosis Date   Carotid arterial disease (HCC)    a. 08/2018 Carotid U/S: min-mod RICA atherosclerosis w/o hemodynamically significant stenosis. Nl LICA.   Diabetes 1.5, managed as type 2 (HCC)    Diastolic dysfunction    a. 08/2018 Echo: EF 65%. No rwma. Gr1 DD. Mild MR.   Diastolic dysfunction    a. 08/2019 Echo: EF 55-60%, Gr1 DD. No rwma. Mild MR. RVSP 37.67mmHg.   Hypercholesterolemia    Hypertension    PAF (paroxysmal atrial fibrillation) (HCC)    a. 10/2019 Event Monitor: PAF; b. CHA2DS2VASc = 5-->Eliquis.   Poorly controlled diabetes mellitus (HCC)    a. 04/2019 A1c 13.8.   Recurrent strokes (HCC)    a. 10/2016 MRI/A: Acute 44mm R thalamic infarct, ? subacute infarct of R corona radiata; b. 08/2017 MRI/A: Acute 61mm lateral L thalamic infarct. Other more remote lacunar infarcts of thalami bilat. Small vessel dzs; c. 08/2018 MRI/A: Acute lacunar infarct of the post  limb of R internal capsule; d. 08/2019 MRI Acute CVA of L paramedian pons adn R cerebellar hemisphere.   Tobacco abuse     Past Surgical History:  Procedure Laterality Date   ESOPHAGOGASTRODUODENOSCOPY (EGD) WITH PROPOFOL N/A 04/26/2019   Procedure: ESOPHAGOGASTRODUODENOSCOPY (EGD) WITH PROPOFOL;  Surgeon: Toney Reil, MD;  Location: [facility] ENDOSCOPY;  Service: Gastroenterology;  Laterality: N/A;    There were no vitals filed for this visit.   Subjective Assessment - 05/21/21 0718     Subjective Pt presents to tx without complaints of pain and denies any falls or near falls from last tx. Pt states greater fatigue compared to normal 2/2 GI complication.    Currently in Pain? No/denies    Pain Score 0-No pain              Treatment:   Therapeutic Exercise:   NuStep, L4, Position 9. Verbal and contact cueing for proper LE/UE mechanics during movement. 10 minutes, for active-assisted mobilization of spastic UE and warm-up to improve lower limb muscle performance.    12'' toe taps with 5# ankle weights bilat 1 min x 2 with gait belt and CGA-Min A with extensive verbal cue to proper step height.    Stair Training 4 steps x6 with progression from bilat UE assist to unilat  UE assist to no UE assist. Gait belt donned with CGA-Min A for elimination of fall risk. Verbal and contact cueing to facilitate optimal tissue loading and correct biomechanical alignment to ensure efficient and safe movement.     Amb in clinic 200 ft  with CGA-min A without single Lofstrand crutch . Pt often will catch LLE on ground and requires close contact to ensure no fall.                 PT Education - 05/21/21 0734     Education Details Pt was educated on proper from and technique with stair amb without lofstand.    Person(s) Educated Patient    Methods Explanation;Demonstration;Tactile cues    Comprehension Verbalized understanding;Returned demonstration;Tactile cues required;Verbal cues  required                 PT Long Term Goals - 04/02/21 1126       PT LONG TERM GOAL #1   Title Pt. able to ambulate 500 feet with least assistive device without abnormal change in vitals/BP to improve safe functional mobility.    Baseline amb. with use of single lofstrand crutch and antalgic gait pattern.    Time 7    Period Weeks    Status New    Target Date 05/21/21      PT LONG TERM GOAL #2   Title Pt will improve 5xSTS by 3 seconds to promote greater functional LE power during transfers and reduce need of UE assist.    Baseline 5xSTS: 18.79 sec with UE assist.    Time 7    Period Weeks    Status New    Target Date 05/21/21      PT LONG TERM GOAL #3   Title Pt will be independent with HEP in order to improve  L UE posture by 10 degs to improve reaching/ functional tasks.    Baseline Shoulder flexion: R: WNL L: 0 AROM. 120 PROM   Shoulder abduction: R : WNL L: 90 AROM. 90 PROM    Shoulder IR: WNL bilat   Shoulder ER R: WNL. L: AROM 0 deg. PROM 16  Elbow flexion R WNL. L: deferred  Elbow ext. R WNL. L: AROM -90 PROM -50    Time 7    Period Weeks    Status New    Target Date 05/21/21      PT LONG TERM GOAL #4   Title Pt will improve LLE ROM equal to RLE to faciliate a normallized gait pattern and decrease fall risk.    Baseline Ankle DF (0-35): 7/-11 (PROM does not significantly change measurement) L hamstring mobility: R: WNL L: lacking 25 degrees    Time 7    Period Weeks    Status New    Target Date 05/21/21                   Plan - 05/21/21 0902     Clinical Impression Statement Pt tolerated tx well with progression of 12'' toe taps with 5# ankle weights bilat. Pt also displayed fair stair mobility with CGA-Min A and verbal cueing. Pt and pt's spouse were educated on d/c next tx 2/2 to visit limits. Pt's spouse was unhappy with the situation. Pt and Pt's spouse were educated on possible referral to pro bono clinic in [place]. Pt continues to display mark  LE/UE endurance, strength, and ROM deficits resulting in decreased safe home/community mobiity, increased pain, and limited access to QOL.  Pt. will continue to benefit from skilled physical therapy to progress POC to address remaining deficits to facilitate maximum functional capacity for optimal personal health and wellness for ADLs.    Personal Factors and Comorbidities Age;Comorbidity 3+;Education;Finances;Past/Current Experience;Fitness;Transportation    Comorbidities HTN, uncontrolled DM, HLD, R basal ganglia stroke on 08/2018 and L pointine and R cerebellar stroke 09/07/19, sinus bradycardia, peripheral neuropathy    Examination-Activity Limitations Bathing;Bed Mobility;Dressing;Transfers;Squat;Lift;Locomotion Level;Stairs;Reach Overhead;Carry;Stand;Sit    Examination-Participation Restrictions Community Activity;Interpersonal Relationship;Yard Work    Conservation officer, historic buildings Evolving/Moderate complexity    Clinical Decision Making Moderate    Rehab Potential Fair    PT Frequency 1x / week    PT Duration Other (comment)   7 weeks   PT Treatment/Interventions ADLs/Self Care Home Management;Aquatic Therapy;Electrical Stimulation;Moist Heat;DME Instruction;Gait training;Stair training;Functional mobility training;Therapeutic activities;Therapeutic exercise;Balance training;Neuromuscular re-education;Patient/family education;Orthotic Fit/Training;Dry needling;Manual techniques;Canalith Repostioning;Passive range of motion;Biofeedback;Energy conservation;Joint Manipulations;Vestibular    PT Next Visit Plan D/c due to visit limit.    PT Home Exercise Plan [id number]    Consulted and Agree with Plan of Care Patient    Family Member Consulted wife             Patient will benefit from skilled therapeutic intervention in order to improve the following deficits and impairments:  Abnormal gait, Decreased coordination, Decreased range of motion, Difficulty walking, Impaired tone, Decreased  endurance, Impaired UE functional use, Decreased activity tolerance, Impaired perceived functional ability, Decreased balance, Impaired flexibility, Decreased cognition, Decreased mobility, Decreased strength, Increased edema, Postural dysfunction, Pain, Cardiopulmonary status limiting activity, Decreased safety awareness, Hypomobility, Improper body mechanics  Visit Diagnosis: At risk for falls  Dysphonia  Hemiplegia and hemiparesis following cerebral infarction affecting left non-dominant side (HCC)  Difficulty in walking, not elsewhere classified  Lacunar infarct, acute (HCC)  Other abnormalities of gait and mobility  Dysarthria and anarthria  Unsteadiness on feet  Muscle weakness (generalized)  Other lack of coordination  Pain in left arm     Problem List Patient Active Problem List   Diagnosis Date Noted   Gastroparesis 02/26/2021   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   Muscle spasm of left lower extremity 02/26/2021   History of multiple strokes 02/26/2021   Type 2 diabetes mellitus with hyperglycemia (HCC) 10/29/2020   Diastolic dysfunction    History of diabetes with ketoacidosis 09/20/2020   CKD (chronic kidney disease) stage 2, GFR 60-89 ml/min 06/27/2020   Monitoring for anticoagulant use 06/27/2020   Lower extremity edema 05/16/2020   Atrial fibrillation (HCC) 10/26/2019   Coagulopathy (HCC) 06/12/2019   Gastroesophageal reflux disease    Hemiplegia and hemiparesis following cerebral infarction affecting left non-dominant side (HCC) 11/25/2018   RBBB 10/15/2018   Dysphagia, post-stroke    Microalbuminuria 07/29/2018   Hyperlipidemia LDL goal <70 02/11/2017   Recurrent strokes (HCC) 11/10/2016   Essential hypertension 11/10/2016   Diabetes mellitus (HCC) 11/10/2016   Tobacco abuse 08/28/2013   Peripheral neuropathy 08/28/2013   Consuela Mimes, PT, DPT #[id number]  Lawernce Ion, Student-PT 05/21/2021, 10:22 AM  [facility] [address] [place], [place], [address] Phone: [phone]   Fax:  [phone]  Name: Evan Ano Sr. MRN: [id number] Date of Birth: [birth year]

## 2021-05-22 ENCOUNTER — Ambulatory Visit: Payer: Medicaid Other | Admitting: Physical Therapy

## 2021-05-22 ENCOUNTER — Other Ambulatory Visit: Payer: Self-pay

## 2021-05-22 ENCOUNTER — Encounter: Payer: Self-pay | Admitting: Physical Therapy

## 2021-05-22 DIAGNOSIS — R2689 Other abnormalities of gait and mobility: Secondary | ICD-10-CM

## 2021-05-22 DIAGNOSIS — M79602 Pain in left arm: Secondary | ICD-10-CM | POA: Diagnosis not present

## 2021-05-22 DIAGNOSIS — Z9181 History of falling: Secondary | ICD-10-CM

## 2021-05-22 DIAGNOSIS — I69354 Hemiplegia and hemiparesis following cerebral infarction affecting left non-dominant side: Secondary | ICD-10-CM

## 2021-05-22 DIAGNOSIS — R278 Other lack of coordination: Secondary | ICD-10-CM | POA: Diagnosis not present

## 2021-05-22 DIAGNOSIS — R2681 Unsteadiness on feet: Secondary | ICD-10-CM

## 2021-05-22 DIAGNOSIS — R490 Dysphonia: Secondary | ICD-10-CM

## 2021-05-22 DIAGNOSIS — I6381 Other cerebral infarction due to occlusion or stenosis of small artery: Secondary | ICD-10-CM

## 2021-05-22 DIAGNOSIS — R262 Difficulty in walking, not elsewhere classified: Secondary | ICD-10-CM | POA: Diagnosis not present

## 2021-05-22 DIAGNOSIS — M6281 Muscle weakness (generalized): Secondary | ICD-10-CM | POA: Diagnosis not present

## 2021-05-22 DIAGNOSIS — R471 Dysarthria and anarthria: Secondary | ICD-10-CM

## 2021-05-22 NOTE — Therapy (Signed)
[facility] [address] [place], [place], [address] Phone: [phone]   Fax:  [phone]  Physical Therapy Treatment and Discharge [place] 04/02/2021-05/22/2021  Patient Details  Name: Evan NEUHARTH Sr. MRN: [id number] Date of Birth: 14-Apr-1956 Referring Provider (PT): Charlynne Cousins, MD   Encounter Date: 05/22/2021   PT End of Session - 05/22/21 1517     Visit Number 7    Number of Visits 7    Date for PT Re-Evaluation 05/21/21    Authorization Type [place] Medicaid; 8/25 to 10/6 for 7 total visits    Authorization Time Period 5 of 7 approved visits    Authorization - Visit Number 7    Authorization - Number of Visits 10    Progress Note Due on Visit 10    PT Start Time 1514    PT Stop Time 1601    PT Time Calculation (min) 47 min    Equipment Utilized During Treatment Gait belt    Activity Tolerance Patient tolerated treatment well;Treatment limited secondary to medical complications (Comment)   CVA   Behavior During Therapy [facility] for tasks assessed/performed;Flat affect             Past Medical History:  Diagnosis Date   Carotid arterial disease ([place])    a. 08/2018 Carotid U/S: min-mod RICA atherosclerosis w/o hemodynamically significant stenosis. Nl LICA.   Diabetes 1.5, managed as type 2 ([place])    Diastolic dysfunction    a. 08/2018 Echo: EF 65%. No rwma. Gr1 DD. Mild MR.   Diastolic dysfunction    a. 08/2019 Echo: EF 55-60%, Gr1 DD. No rwma. Mild MR. RVSP 37.65mHg.   Hypercholesterolemia    Hypertension    PAF (paroxysmal atrial fibrillation) ([place]    a. 10/2019 Event Monitor: PAF; b. CHA2DS2VASc = 5-->Eliquis.   Poorly controlled diabetes mellitus ([place]    a. 04/2019 A1c 13.8.   Recurrent strokes ([place]    a. 10/2016 MRI/A: Acute 563mR thalamic infarct, ? subacute infarct of R corona radiata; b. 08/2017 MRI/A: Acute 36m47materal L thalamic infarct. Other more remote lacunar infarcts of thalami bilat. Small vessel dzs; c.  08/2018 MRI/A: Acute lacunar infarct of the post limb of R internal capsule; d. 08/2019 MRI Acute CVA of L paramedian pons adn R cerebellar hemisphere.   Tobacco abuse     Past Surgical History:  Procedure Laterality Date   ESOPHAGOGASTRODUODENOSCOPY (EGD) WITH PROPOFOL N/A 04/26/2019   Procedure: ESOPHAGOGASTRODUODENOSCOPY (EGD) WITH PROPOFOL;  Surgeon: VanLin LandsmanD;  [facility];  Service: Gastroenterology;  Laterality: N/A;    There were no vitals filed for this visit.   Subjective Assessment - 05/22/21 1525     Subjective Pt presents to tx without complaints of pain and reports no falls from last tx. Pt is understandable with d/c 2/2 visit limit is displays frustation. Pt would like to [facility].    Currently in Pain? No/denies    Pain Score 0-No pain             Treatment and reassessment:   NuStep, L4, Position 9. Verbal and contact cueing for proper LE/UE mechanics during movement. 10 minutes, for active-assisted mobilization of spastic UE and warm-up to improve lower limb muscle performance.     AMB: Pt was able to amb 125 ft with slow gait patten and  poor LLE toe clearance. Pt must use single lofstrand crutch in RUE in order to  perfrom independent gait. Pt started to fatigue after 148f displaying increase in L toe catching.  5xSTS: 12.39 with UE assist. unable to perform without UE assist.   UE ROM: Shoulder flexion: R: WNL L: 90 AROM. 145 PROM Shoulder abduction: R : WNL L: 90 AROM. 90 PROM Shoulder IR: WNL bilat Shoulder ER R: WNL. L: AROM 0 PROM 15 deg. Elbow flexion R WNL. L: WNL Elbow ext. R WNL. L: AROM -80 PROM -30  LE ROM: DF 7/12 L Hamstring mobility: L lacking 20. R WNL.      MHP over L bicep. Position in Supine. 10 mins. * not billed*     PT Education - 05/22/21 1748     Education Details Pt was educated to continue HEP for maintenance of therapeutic gains obtained during tx.    Person(s)  Educated Patient    Methods Explanation    Comprehension Verbalized understanding                 PT Long Term Goals - 05/22/21 1528       PT LONG TERM GOAL #1   Title Pt. able to ambulate 500 feet with least assistive device without abnormal change in vitals/BP to improve safe functional mobility.    Baseline amb. with use of single lofstrand crutch and antalgic gait pattern. 10/6: Pt was able to amb 125 ft with slow gait patten poor LLE toe clearance. Pt must use single lofstrand crutch in RUE in order to perfrom independent gait. Pt started to fatigue after 1059fdisplaying increase in L toe catching.    Time 7    Period Weeks    Status Partially Met    Target Date 05/22/21      PT LONG TERM GOAL #2   Title Pt will improve 5xSTS by 3 seconds to promote greater functional LE power during transfers and reduce need of UE assist.    Baseline 5xSTS: 18.79 sec with UE assist. 10/6 12.39 with UE assist. unable to perform without UE assist.    Time 7    Period Weeks    Status Achieved    Target Date 05/22/21      PT LONG TERM GOAL #3   Title Pt will be independent with HEP in order to improve  L UE posture by 10 degs to improve reaching/ functional tasks.    Baseline Shoulder flexion: R: WNL L: 0 AROM. 120 PROM   Shoulder abduction: R : WNL L: 90 AROM. 90 PROM    Shoulder IR: WNL bilat   Shoulder ER R: WNL. L: AROM 0 deg. PROM 16  Elbow flexion R WNL. L: deferred  Elbow ext. R WNL. L: AROM -90 PROM -50  10/6 Shoulder flexion: R: WNL L: 90 AROM. 145 PROM   Shoulder abduction: R : WNL L: 90 AROM. 90 PROM    Shoulder IR: WNL bilat   Shoulder ER R: WNL. L: AROM 0 PROM 15 deg.  Elbow flexion R WNL. L: WNL  Elbow ext. R WNL. L: AROM -80 PROM -30    Time 7    Period Weeks    Status Achieved    Target Date 05/22/21      PT LONG TERM GOAL #4   Title Pt will improve LLE ROM equal to RLE to faciliate a normallized gait pattern and decrease fall risk.    Baseline Ankle DF (0-35): 7/-11 (PROM  does not significantly change measurement) L hamstring mobility: R: WNL L: lacking 25 degrees 10/6:  DF 7/12 L Hamstring mobility: L lacking 20. R WNL.    Time 7    Period Weeks    Status Partially Met    Target Date 05/22/21                   Plan - 05/22/21 1526     Clinical Impression Statement Pt was reassessed and d/c on this date 05/22/2021. PT examination displayed the following findings: AMB: Pt was able to amb 125 ft with slow gait patten and  poor LLE toe clearance. Pt must use single lofstrand crutch in RUE in order to perfrom independent gait. Pt started to fatigue after 167f displaying increase in L toe catching.  5xSTS: 12.39 with UE assist. unable to perform without UE assist.  UE ROM: Shoulder flexion: R: WNL L: 90 AROM. 145 PROM Shoulder abduction: R : WNL L: 90 AROM. 90 PROM Shoulder IR: WNL bilat Shoulder ER R: WNL. L: AROM 0 PROM 15 deg. Elbow flexion R WNL. L: WNL Elbow ext. R WNL. L: AROM -80 PROM -30. LE ROM: DF 7/12 L Hamstring mobility: L lacking 20. R WNL. Pt was d/c with fair prognosis 2/2 reaching the visit limit. Pt was educated on possible referral to [facility] for further tx. Pt was educated on the importance of continued HEP adherence until visits reset at the turn of the reach.    Personal Factors and Comorbidities Age;Comorbidity 3+;Education;Finances;Past/Current Experience;Fitness;Transportation    Comorbidities HTN, uncontrolled DM, HLD, R basal ganglia stroke on 08/2018 and L pointine and R cerebellar stroke 09/07/19, sinus bradycardia, peripheral neuropathy    Examination-Activity Limitations Bathing;Bed Mobility;Dressing;Transfers;Squat;Lift;Locomotion Level;Stairs;Reach Overhead;Carry;Stand;Sit    Examination-Participation Restrictions Community Activity;Interpersonal Relationship;Yard Work    SMerchant navy officerEvolving/Moderate complexity    Clinical Decision Making Moderate    Rehab Potential Fair    PT Frequency 1x /  week    PT Duration Other (comment)   7 weeks   PT Treatment/Interventions ADLs/Self Care Home Management;Aquatic Therapy;Electrical Stimulation;Moist Heat;DME Instruction;Gait training;Stair training;Functional mobility training;Therapeutic activities;Therapeutic exercise;Balance training;Neuromuscular re-education;Patient/family education;Orthotic Fit/Training;Dry needling;Manual techniques;Canalith Repostioning;Passive range of motion;Biofeedback;Energy conservation;Joint Manipulations;Vestibular    PT Next Visit Plan D/c due to visit limit.    PT Home Exercise Plan [id number]    Consulted and Agree with Plan of Care Patient    Family Member Consulted wife             Patient will benefit from skilled therapeutic intervention in order to improve the following deficits and impairments:  Abnormal gait, Decreased coordination, Decreased range of motion, Difficulty walking, Impaired tone, Decreased endurance, Impaired UE functional use, Decreased activity tolerance, Impaired perceived functional ability, Decreased balance, Impaired flexibility, Decreased cognition, Decreased mobility, Decreased strength, Increased edema, Postural dysfunction, Pain, Cardiopulmonary status limiting activity, Decreased safety awareness, Hypomobility, Improper body mechanics  Visit Diagnosis: At risk for falls  Lacunar infarct, acute (HCC)  Muscle weakness (generalized)  Other lack of coordination  Other abnormalities of gait and mobility  Dysphonia  Hemiplegia and hemiparesis following cerebral infarction affecting left non-dominant side (HCC)  Pain in left arm  Difficulty in walking, not elsewhere classified  Dysarthria and anarthria  Unsteadiness on feet     Problem List Patient Active Problem List   Diagnosis Date Noted   Gastroparesis 02/26/2021   Moderate major depression ([place] 02/26/2021   Muscle spasm of left lower extremity 02/26/2021   History of multiple strokes 02/26/2021   Type  2 diabetes mellitus with hyperglycemia ([place] 066/01/44  Diastolic  dysfunction    History of diabetes with ketoacidosis 09/20/2020   CKD (chronic kidney disease) stage 2, GFR 60-89 ml/min 06/27/2020   Monitoring for anticoagulant use 06/27/2020   Lower extremity edema 05/16/2020   Atrial fibrillation ([place]) 10/26/2019   Coagulopathy ([place]) 06/12/2019   Gastroesophageal reflux disease    Hemiplegia and hemiparesis following cerebral infarction affecting left non-dominant side ([place]) 11/25/2018   RBBB 10/15/2018   Dysphagia, post-stroke    Microalbuminuria 07/29/2018   Hyperlipidemia LDL goal <70 02/11/2017   Recurrent strokes ([place]) 11/10/2016   Essential hypertension 11/10/2016   Diabetes mellitus ([place]) 11/10/2016   Tobacco abuse 08/28/2013   Peripheral neuropathy 08/28/2013    Fara Olden, Student-PT 05/22/2021, 5:55 PM   This entire session was performed under direct supervision and direction of a licensed therapist/therapist assistant . I have personally read, edited and approve of the note as written. Patrina Levering PT, DPT   [facility] [address] [place], [place], [address] Phone: [phone]   Fax:  [phone]  Name: Evan Cones Sr. MRN: [id number] Date of Birth: [birth year]

## 2021-06-02 ENCOUNTER — Other Ambulatory Visit: Payer: Self-pay

## 2021-06-02 ENCOUNTER — Ambulatory Visit: Payer: Medicaid Other | Admitting: Internal Medicine

## 2021-06-02 ENCOUNTER — Encounter: Payer: Self-pay | Admitting: Internal Medicine

## 2021-06-02 VITALS — BP 138/70 | HR 51 | Temp 97.7°F | Ht 64.96 in | Wt 152.4 lb

## 2021-06-02 DIAGNOSIS — I639 Cerebral infarction, unspecified: Secondary | ICD-10-CM

## 2021-06-02 DIAGNOSIS — K5900 Constipation, unspecified: Secondary | ICD-10-CM | POA: Diagnosis not present

## 2021-06-02 DIAGNOSIS — G4700 Insomnia, unspecified: Secondary | ICD-10-CM | POA: Diagnosis not present

## 2021-06-02 DIAGNOSIS — Z23 Encounter for immunization: Secondary | ICD-10-CM

## 2021-06-02 MED ORDER — MELATONIN 1 MG PO CAPS: 1.0000 mg | ORAL_CAPSULE | Freq: Every day | ORAL | 0 refills | Status: DC

## 2021-06-02 MED ORDER — POLYETHYLENE GLYCOL 3350 17 G PO PACK: 17.0000 g | PACK | Freq: Every day | ORAL | 3 refills | Status: DC

## 2021-06-02 NOTE — Progress Notes (Signed)
 BP 138/70   Pulse (!) 51   Temp 97.7 F (36.5 C) (Oral)   Ht 5' 4.96" (1.65 m)   Wt 152 lb 6.4 oz (69.1 kg)   SpO2 99%   BMI 25.39 kg/m    Subjective:    Patient ID: Evan E Alleyne Sr., male    DOB: 03/18/1956, 65 y.o.   MRN: [id number]  Chief Complaint  Patient presents with   Hospitalization Follow-up    Symptoms has resolved.    HPI: Evan E Dezeeuw Sr. is a 65 y.o. male  Pt went to the ER for abdominal pain was diagnosed with constipation. Was placed on miralax, docusate. Has had diarrhea x yesterday   Constipation This is a chronic problem. The current episode started 1 to 4 weeks ago. Pertinent negatives include no abdominal pain, anorexia, back pain, bloating, diarrhea, fecal incontinence, fever, flatus, hematochezia, hemorrhoids, melena, nausea, rectal pain, vomiting or weight loss.  Insomnia Primary symptoms: no fragmented sleep, no sleep disturbance, no difficulty falling asleep, no somnolence, no frequent awakening, no premature morning awakening, no malaise/fatigue, napping.   PMH includes: associated symptoms present, no hypertension, no depression, no family stress or anxiety, no chronic pain.    Chief Complaint  Patient presents with   Hospitalization Follow-up    Symptoms has resolved.    Relevant past medical, surgical, family and social history reviewed and updated as indicated. Interim medical history since our last visit reviewed. Allergies and medications reviewed and updated.  Review of Systems  Constitutional:  Negative for fever, malaise/fatigue and weight loss.  Gastrointestinal:  Positive for constipation. Negative for abdominal pain, anorexia, bloating, diarrhea, flatus, hematochezia, hemorrhoids, melena, nausea, rectal pain and vomiting.  Musculoskeletal:  Negative for back pain.  Psychiatric/Behavioral:  Negative for depression and sleep disturbance. The patient has insomnia.    Per HPI unless specifically indicated above     Objective:     BP 138/70   Pulse (!) 51   Temp 97.7 F (36.5 C) (Oral)   Ht 5' 4.96" (1.65 m)   Wt 152 lb 6.4 oz (69.1 kg)   SpO2 99%   BMI 25.39 kg/m   Wt Readings from Last 3 Encounters:  06/02/21 152 lb 6.4 oz (69.1 kg)  05/16/21 165 lb (74.8 kg)  03/20/21 157 lb (71.2 kg)    Physical Exam Vitals and nursing note reviewed.  Constitutional:      General: He is not in acute distress.    Appearance: Normal appearance. He is not ill-appearing or diaphoretic.  HENT:     Head: Normocephalic and atraumatic.     Right Ear: Tympanic membrane and external ear normal. There is no impacted cerumen.     Left Ear: External ear normal.     Nose: No congestion or rhinorrhea.     Mouth/Throat:     Pharynx: No oropharyngeal exudate or posterior oropharyngeal erythema.  Eyes:     Conjunctiva/sclera: Conjunctivae normal.     Pupils: Pupils are equal, round, and reactive to light.  Cardiovascular:     Rate and Rhythm: Normal rate and regular rhythm.     Heart sounds: No murmur heard.   No friction rub. No gallop.  Pulmonary:     Effort: No respiratory distress.     Breath sounds: No stridor. No wheezing or rhonchi.  Chest:     Chest wall: No tenderness.  Abdominal:     General: Abdomen is flat. Bowel sounds are normal.     Palpations: Abdomen is   soft. There is no mass.     Tenderness: There is no abdominal tenderness.  Musculoskeletal:     Cervical back: Normal range of motion and neck supple. No rigidity or tenderness.     Left lower leg: No edema.  Skin:    General: Skin is warm and dry.  Neurological:     Mental Status: He is alert.     Cranial Nerves: Cranial nerve deficit present.     Motor: Weakness present.     Gait: Gait abnormal.   Results for orders placed or performed during the hospital encounter of 05/16/21  Urinalysis, Complete w Microscopic Urine, Random  Result Value Ref Range   Color, Urine YELLOW YELLOW   APPearance CLEAR CLEAR   Specific Gravity, Urine 1.020 1.005 -  1.030   pH 5.5 5.0 - 8.0   Glucose, UA >1,000 (A) NEGATIVE mg/dL   Hgb urine dipstick SMALL (A) NEGATIVE   Bilirubin Urine NEGATIVE NEGATIVE   Ketones, ur NEGATIVE NEGATIVE mg/dL   Protein, ur >300 (A) NEGATIVE mg/dL   Nitrite NEGATIVE NEGATIVE   Leukocytes,Ua NEGATIVE NEGATIVE   Squamous Epithelial / LPF NONE SEEN 0 - 5   WBC, UA NONE SEEN 0 - 5 WBC/hpf   RBC / HPF 0-5 0 - 5 RBC/hpf   Bacteria, UA RARE (A) NONE SEEN  Comprehensive metabolic panel  Result Value Ref Range   Sodium 139 135 - 145 mmol/L   Potassium 3.5 3.5 - 5.1 mmol/L   Chloride 107 98 - 111 mmol/L   CO2 24 22 - 32 mmol/L   Glucose, Bld 189 (H) 70 - 99 mg/dL   BUN 20 8 - 23 mg/dL   Creatinine, Ser 1.13 0.61 - 1.24 mg/dL   Calcium 9.2 8.9 - 10.3 mg/dL   Total Protein 8.0 6.5 - 8.1 g/dL   Albumin 4.4 3.5 - 5.0 g/dL   AST 23 15 - 41 U/L   ALT 37 0 - 44 U/L   Alkaline Phosphatase 136 (H) 38 - 126 U/L   Total Bilirubin 0.6 0.3 - 1.2 mg/dL   GFR, Estimated >60 >60 mL/min   Anion gap 8 5 - 15  CBC with Differential  Result Value Ref Range   WBC 5.7 4.0 - 10.5 K/uL   RBC 4.30 4.22 - 5.81 MIL/uL   Hemoglobin 12.7 (L) 13.0 - 17.0 g/dL   HCT 37.7 (L) 39.0 - 52.0 %   MCV 87.7 80.0 - 100.0 fL   MCH 29.5 26.0 - 34.0 pg   MCHC 33.7 30.0 - 36.0 g/dL   RDW 15.6 (H) 11.5 - 15.5 %   Platelets 258 150 - 400 K/uL   nRBC 0.0 0.0 - 0.2 %   Neutrophils Relative % 51 %   Neutro Abs 2.9 1.7 - 7.7 K/uL   Lymphocytes Relative 38 %   Lymphs Abs 2.2 0.7 - 4.0 K/uL   Monocytes Relative 8 %   Monocytes Absolute 0.4 0.1 - 1.0 K/uL   Eosinophils Relative 3 %   Eosinophils Absolute 0.2 0.0 - 0.5 K/uL   Basophils Relative 0 %   Basophils Absolute 0.0 0.0 - 0.1 K/uL   Immature Granulocytes 0 %   Abs Immature Granulocytes 0.02 0.00 - 0.07 K/uL  Lipase, blood  Result Value Ref Range   Lipase 26 11 - 51 U/L  Troponin I (High Sensitivity)  Result Value Ref Range   Troponin I (High Sensitivity) 8 <18 ng/L        Current  Outpatient Medications:    ACCU-CHEK GUIDE test strip, Check fsbs once daily, Disp: 100 each, Rfl: 2   Accu-Chek Softclix Lancets lancets, SMARTSIG:Topical, Disp: 100 each, Rfl: 1   albuterol (VENTOLIN HFA) 108 (90 Base) MCG/ACT inhaler, Inhale 2 puffs into the lungs every 6 (six) hours as needed for wheezing or shortness of breath., Disp: 8 g, Rfl: 1   amLODipine (NORVASC) 5 MG tablet, Take 1 tablet (5 mg total) by mouth daily., Disp: 90 tablet, Rfl: 0   apixaban (ELIQUIS) 5 MG TABS tablet, Take 1 tablet (5 mg total) by mouth 2 (two) times daily., Disp: 60 tablet, Rfl: 1   atorvastatin (LIPITOR) 80 MG tablet, Take 1 tablet (80 mg total) by mouth daily., Disp: 90 tablet, Rfl: 1   baclofen (LIORESAL) 10 MG tablet, Take 1 tablet (10 mg total) by mouth 3 (three) times daily., Disp: 90 each, Rfl: 1   Blood Glucose Monitoring Suppl (FIFTY50 GLUCOSE METER 2.0) w/Device KIT, Use as instructed, Disp: , Rfl:    Blood Glucose Monitoring Suppl KIT, Accucheck Guide with lancets and test strips #100 with one refill.  Test blood sugar twice daily. DX Code E11.65 ; Z79.4, Disp: 1 kit, Rfl: 0   Continuous Blood Gluc Receiver (FREESTYLE LIBRE 2 READER) DEVI, 1 each by Does not apply route daily., Disp: 1 each, Rfl: 0   Continuous Blood Gluc Sensor (FREESTYLE LIBRE 2 SENSOR) MISC, 1 each by Does not apply route every 14 (fourteen) days., Disp: 6 each, Rfl: 1   dapagliflozin propanediol (FARXIGA) 10 MG TABS tablet, Take 1 tablet (10 mg total) by mouth daily before breakfast. In place of glipizide, Disp: 90 tablet, Rfl: 1   Insulin Pen Needle 32G X 6 MM MISC, Daily, Disp: 100 each, Rfl: 3   lisinopril (ZESTRIL) 40 MG tablet, Take 1 tablet (40 mg total) by mouth daily., Disp: 90 tablet, Rfl: 3   Melatonin 1 MG CAPS, Take 1 capsule (1 mg total) by mouth at bedtime., Disp: 30 capsule, Rfl: 0   metoCLOPramide (REGLAN) 5 MG tablet, TAKE 1 TABLET BY MOUTH 3 TIMES DAILY BEFORE MEALS, Disp: 90 tablet, Rfl: 2   metoprolol  succinate (TOPROL-XL) 25 MG 24 hr tablet, Take 1 tablet (25 mg total) by mouth daily., Disp: 30 tablet, Rfl: 3   Misc. Devices MISC, One pair of Compression stockings  Hemiplegia and hemiparesis following cerebral infarction affecting left non-dominant side (HCC)  - Primary Codes: H47.425 Lower extremity edema  Codes: R60.0 Type 2 diabetes mellitus with hyperglycemia, with long-term current use of insulin (HCC)  Codes: E11.65, Z79.4, Disp: 1 Units, Rfl: 0   omeprazole (PRILOSEC) 40 MG capsule, TAKE 1 CAPSULE BY MOUTH IN THE MORNING AND AT BEDTIME, Disp: 90 capsule, Rfl: 1   polyethylene glycol (MIRALAX MIX-IN PAX) 17 g packet, Take 17 g by mouth daily., Disp: 14 each, Rfl: 3   senna-docusate (SENOKOT-S) 8.6-50 MG tablet, Take 2 tablets by mouth 2 (two) times daily., Disp: 120 tablet, Rfl: 0   sitaGLIPtin (JANUVIA) 50 MG tablet, Take 1 tablet (50 mg total) by mouth daily., Disp: 30 tablet, Rfl: 3    Assessment & Plan:  1. Constipation diet consists of fish / hot dogs/ potatoes. increase fiber in diet.  Increase vegetables in diet.  Increase water to 8 - 10 glasses a day. Stop docusate pills sec to diarrhea now. , increase water intake, continue miralax.  Insomnia will start pt on melatonin for such  Educated on sleep hygiene, pt naps frequently in  the day.  Avoid napping.   CVA will refer for PT.    Problem List Items Addressed This Visit       Other   Need for influenza vaccination - Primary   Relevant Orders   Flu Vaccine QUAD High Dose(Fluad) (Completed)   Constipation   Insomnia   Other Visit Diagnoses     Cerebrovascular accident (CVA), unspecified mechanism (HCC)       Relevant Orders   Ambulatory referral to Physical Therapy        Orders Placed This Encounter  Procedures   Flu Vaccine QUAD High Dose(Fluad)   Ambulatory referral to Physical Therapy     Meds ordered this encounter  Medications   polyethylene glycol (MIRALAX MIX-IN PAX) 17 g packet    Sig: Take  17 g by mouth daily.    Dispense:  14 each    Refill:  3   Melatonin 1 MG CAPS    Sig: Take 1 capsule (1 mg total) by mouth at bedtime.    Dispense:  30 capsule    Refill:  0     Follow up plan: Return in about 3 months (around 09/02/2021).  

## 2021-06-05 DIAGNOSIS — I639 Cerebral infarction, unspecified: Secondary | ICD-10-CM | POA: Insufficient documentation

## 2021-06-09 ENCOUNTER — Ambulatory Visit: Payer: Medicaid Other | Admitting: Podiatry

## 2021-06-12 ENCOUNTER — Other Ambulatory Visit: Payer: Self-pay

## 2021-06-12 ENCOUNTER — Ambulatory Visit: Payer: Medicaid Other | Admitting: Podiatry

## 2021-06-12 ENCOUNTER — Encounter: Payer: Self-pay | Admitting: Podiatry

## 2021-06-12 DIAGNOSIS — D689 Coagulation defect, unspecified: Secondary | ICD-10-CM

## 2021-06-12 DIAGNOSIS — M79674 Pain in right toe(s): Secondary | ICD-10-CM

## 2021-06-12 DIAGNOSIS — M79675 Pain in left toe(s): Secondary | ICD-10-CM

## 2021-06-12 DIAGNOSIS — N182 Chronic kidney disease, stage 2 (mild): Secondary | ICD-10-CM

## 2021-06-12 DIAGNOSIS — B351 Tinea unguium: Secondary | ICD-10-CM | POA: Diagnosis not present

## 2021-06-12 DIAGNOSIS — E1142 Type 2 diabetes mellitus with diabetic polyneuropathy: Secondary | ICD-10-CM

## 2021-06-12 DIAGNOSIS — N179 Acute kidney failure, unspecified: Secondary | ICD-10-CM

## 2021-06-12 NOTE — Progress Notes (Signed)
This patient returns to my office for at risk foot care.  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 Patient is taking eliquis.  He is accompanied by his daughter..This patient is unable to cut nails himself since the patient cannot reach his nails.These nails are painful walking and wearing shoes.  This patient presents for at risk foot care today.  General Appearance  Alert, conversant and in no acute stress.  Vascular  Dorsalis pedis and posterior tibial  pulses are palpable  bilaterally.  Capillary return is within normal limits  bilaterally. Temperature is within normal limits  bilaterally.  Neurologic  Senn-Weinstein monofilament wire test within normal limits  bilaterally. Muscle power within normal limits bilaterally.  Nails Thick disfigured discolored nails with subungual debris  from hallux to fifth toes bilaterally. No evidence of bacterial infection or drainage bilaterally.  Orthopedic  No limitations of motion  feet .  No crepitus or effusions noted.  No bony pathology or digital deformities noted.  Skin  normotropic skin with no porokeratosis noted bilaterally.  No signs of infections or ulcers noted.  White cauliflower appearing tissue in arch right foot.  Asymptomatic.   Onychomycosis  Pain in right toes  Pain in left toes  Consent was obtained for treatment procedures.   Mechanical debridement of nails 1-5  bilaterally performed with a nail nipper.  Filed with dremel without incident. No infection or ulcer.     Return office visit   12 weeks                  Told patient to return for periodic foot care and evaluation due to potential at risk complications.   Lisbet Busker DPM  

## 2021-06-19 ENCOUNTER — Ambulatory Visit: Payer: Medicaid Other | Admitting: Internal Medicine

## 2021-06-20 ENCOUNTER — Ambulatory Visit: Payer: Medicaid Other | Admitting: Internal Medicine

## 2021-06-20 ENCOUNTER — Encounter: Payer: Self-pay | Admitting: Internal Medicine

## 2021-06-20 ENCOUNTER — Other Ambulatory Visit: Payer: Self-pay

## 2021-06-20 VITALS — BP 124/74 | HR 70 | Temp 98.9°F | Ht 64.96 in | Wt 149.2 lb

## 2021-06-20 DIAGNOSIS — M62838 Other muscle spasm: Secondary | ICD-10-CM | POA: Diagnosis not present

## 2021-06-20 DIAGNOSIS — Z794 Long term (current) use of insulin: Secondary | ICD-10-CM

## 2021-06-20 DIAGNOSIS — Z23 Encounter for immunization: Secondary | ICD-10-CM

## 2021-06-20 DIAGNOSIS — I4891 Unspecified atrial fibrillation: Secondary | ICD-10-CM | POA: Diagnosis not present

## 2021-06-20 DIAGNOSIS — E1159 Type 2 diabetes mellitus with other circulatory complications: Secondary | ICD-10-CM | POA: Diagnosis not present

## 2021-06-20 LAB — BAYER DCA HB A1C WAIVED: HB A1C (BAYER DCA - WAIVED): 7.5 % — ABNORMAL HIGH (ref 4.8–5.6)

## 2021-06-20 MED ORDER — FREESTYLE LIBRE 2 SENSOR MISC: 1.0000 | 1 refills | Status: DC

## 2021-06-20 MED ORDER — METOPROLOL SUCCINATE ER 25 MG PO TB24: 25.0000 mg | ORAL_TABLET | Freq: Every day | ORAL | 3 refills | Status: DC

## 2021-06-20 MED ORDER — BACLOFEN 10 MG PO TABS: 10.0000 mg | ORAL_TABLET | Freq: Three times a day (TID) | ORAL | 1 refills | Status: DC

## 2021-06-20 MED ORDER — SITAGLIPTIN PHOSPHATE 50 MG PO TABS: 50.0000 mg | ORAL_TABLET | Freq: Every day | ORAL | 3 refills | Status: DC

## 2021-06-20 MED ORDER — COMFORT PROTECT ADULT DIAPER/L MISC: 1.0000 [IU] | Freq: Every day | 3 refills | Status: DC

## 2021-06-20 MED ORDER — SITAGLIPTIN PHOSPHATE 100 MG PO TABS: 100.0000 mg | ORAL_TABLET | Freq: Every day | ORAL | 6 refills | Status: DC

## 2021-06-20 NOTE — Progress Notes (Signed)
Ht 5' 4.96" (1.65 m)   Wt 149 lb 3.2 oz (67.7 kg)   BMI 24.86 kg/m    Subjective:    Patient ID: Evan Cones Sr., male    DOB: 03-03-1956, 65 y.o.   MRN: [id number]  Chief Complaint  Patient presents with   Foot Ulcer    Left 3rd toe, noticed this week     HPI: Evan LEFEVERS Sr. is a 65 y.o. male  124/74 mm hg wound noted on the 3rd toe ? Had some recent scrapping per prodaitry   Hypertension This is a chronic problem. Pertinent negatives include no blurred vision or chest pain.  Diabetes He presents for his follow-up diabetic visit. He has type 2 diabetes mellitus. Associated symptoms include foot ulcerations. Pertinent negatives for diabetes include no blurred vision, no chest pain, no fatigue, no polydipsia and no polyphagia.   Chief Complaint  Patient presents with   Foot Ulcer    Left 3rd toe, noticed this week     Relevant past medical, surgical, family and social history reviewed and updated as indicated. Interim medical history since our last visit reviewed. Allergies and medications reviewed and updated.  Review of Systems  Constitutional:  Negative for fatigue.  Eyes:  Negative for blurred vision.  Cardiovascular:  Negative for chest pain.  Endocrine: Negative for polydipsia and polyphagia.   Per HPI unless specifically indicated above     Objective:    Ht 5' 4.96" (1.65 m)   Wt 149 lb 3.2 oz (67.7 kg)   BMI 24.86 kg/m   Wt Readings from Last 3 Encounters:  06/20/21 149 lb 3.2 oz (67.7 kg)  06/02/21 152 lb 6.4 oz (69.1 kg)  05/16/21 165 lb (74.8 kg)    Physical Exam Vitals and nursing note reviewed.  Constitutional:      General: He is not in acute distress.    Appearance: Normal appearance. He is not ill-appearing or diaphoretic.  HENT:     Head: Normocephalic and atraumatic.     Right Ear: Tympanic membrane and external ear normal. There is no impacted cerumen.     Left Ear: External ear normal.     Nose: No congestion or  rhinorrhea.     Mouth/Throat:     Pharynx: No oropharyngeal exudate or posterior oropharyngeal erythema.  Eyes:     Conjunctiva/sclera: Conjunctivae normal.     Pupils: Pupils are equal, round, and reactive to light.  Cardiovascular:     Rate and Rhythm: Normal rate and regular rhythm.     Heart sounds: No murmur heard.   No friction rub. No gallop.  Pulmonary:     Effort: No respiratory distress.     Breath sounds: No stridor. No wheezing or rhonchi.  Chest:     Chest wall: No tenderness.  Abdominal:     General: Abdomen is flat. Bowel sounds are normal.     Palpations: Abdomen is soft. There is no mass.     Tenderness: There is no abdominal tenderness.  Musculoskeletal:     Cervical back: Normal range of motion and neck supple. No rigidity or tenderness.     Left lower leg: No edema.  Skin:    General: Skin is warm and dry.     Coloration: Skin is not jaundiced or pale.     Findings: No bruising, erythema or lesion.     Comments: Wound noted on the medial aspect of the 3rd toe on left foot not infected no discharge  noted.   Neurological:     Mental Status: He is alert.     Cranial Nerves: No cranial nerve deficit.     Motor: Weakness present.     Gait: Gait abnormal.    Results for orders placed or performed during the hospital encounter of 05/16/21  Urinalysis, Complete w Microscopic Urine, Random  Result Value Ref Range   Color, Urine YELLOW YELLOW   APPearance CLEAR CLEAR   Specific Gravity, Urine 1.020 1.005 - 1.030   pH 5.5 5.0 - 8.0   Glucose, UA >1,000 (A) NEGATIVE mg/dL   Hgb urine dipstick SMALL (A) NEGATIVE   Bilirubin Urine NEGATIVE NEGATIVE   Ketones, ur NEGATIVE NEGATIVE mg/dL   Protein, ur >300 (A) NEGATIVE mg/dL   Nitrite NEGATIVE NEGATIVE   Leukocytes,Ua NEGATIVE NEGATIVE   Squamous Epithelial / LPF NONE SEEN 0 - 5   WBC, UA NONE SEEN 0 - 5 WBC/hpf   RBC / HPF 0-5 0 - 5 RBC/hpf   Bacteria, UA RARE (A) NONE SEEN  Comprehensive metabolic panel   Result Value Ref Range   Sodium 139 135 - 145 mmol/L   Potassium 3.5 3.5 - 5.1 mmol/L   Chloride 107 98 - 111 mmol/L   CO2 24 22 - 32 mmol/L   Glucose, Bld 189 (H) 70 - 99 mg/dL   BUN 20 8 - 23 mg/dL   Creatinine, Ser 1.13 0.61 - 1.24 mg/dL   Calcium 9.2 8.9 - 10.3 mg/dL   Total Protein 8.0 6.5 - 8.1 g/dL   Albumin 4.4 3.5 - 5.0 g/dL   AST 23 15 - 41 U/L   ALT 37 0 - 44 U/L   Alkaline Phosphatase 136 (H) 38 - 126 U/L   Total Bilirubin 0.6 0.3 - 1.2 mg/dL   GFR, Estimated >60 >60 mL/min   Anion gap 8 5 - 15  CBC with Differential  Result Value Ref Range   WBC 5.7 4.0 - 10.5 K/uL   RBC 4.30 4.22 - 5.81 MIL/uL   Hemoglobin 12.7 (L) 13.0 - 17.0 g/dL   HCT 37.7 (L) 39.0 - 52.0 %   MCV 87.7 80.0 - 100.0 fL   MCH 29.5 26.0 - 34.0 pg   MCHC 33.7 30.0 - 36.0 g/dL   RDW 15.6 (H) 11.5 - 15.5 %   Platelets 258 150 - 400 K/uL   nRBC 0.0 0.0 - 0.2 %   Neutrophils Relative % 51 %   Neutro Abs 2.9 1.7 - 7.7 K/uL   Lymphocytes Relative 38 %   Lymphs Abs 2.2 0.7 - 4.0 K/uL   Monocytes Relative 8 %   Monocytes Absolute 0.4 0.1 - 1.0 K/uL   Eosinophils Relative 3 %   Eosinophils Absolute 0.2 0.0 - 0.5 K/uL   Basophils Relative 0 %   Basophils Absolute 0.0 0.0 - 0.1 K/uL   Immature Granulocytes 0 %   Abs Immature Granulocytes 0.02 0.00 - 0.07 K/uL  Lipase, blood  Result Value Ref Range   Lipase 26 11 - 51 U/L  Troponin I (High Sensitivity)  Result Value Ref Range   Troponin I (High Sensitivity) 8 <18 ng/L        Current Outpatient Medications:    ACCU-CHEK GUIDE test strip, Check fsbs once daily, Disp: 100 each, Rfl: 2   Accu-Chek Softclix Lancets lancets, SMARTSIG:Topical, Disp: 100 each, Rfl: 1   albuterol (VENTOLIN HFA) 108 (90 Base) MCG/ACT inhaler, Inhale 2 puffs into the lungs every 6 (six) hours as needed  for wheezing or shortness of breath., Disp: 8 g, Rfl: 1   amLODipine (NORVASC) 5 MG tablet, Take 1 tablet (5 mg total) by mouth daily., Disp: 90 tablet, Rfl: 0   apixaban  (ELIQUIS) 5 MG TABS tablet, Take 1 tablet (5 mg total) by mouth 2 (two) times daily., Disp: 60 tablet, Rfl: 1   atorvastatin (LIPITOR) 80 MG tablet, Take 1 tablet (80 mg total) by mouth daily., Disp: 90 tablet, Rfl: 1   baclofen (LIORESAL) 10 MG tablet, Take 1 tablet (10 mg total) by mouth 3 (three) times daily., Disp: 90 each, Rfl: 1   Blood Glucose Monitoring Suppl (FIFTY50 GLUCOSE METER 2.0) w/Device KIT, Use as instructed, Disp: , Rfl:    Blood Glucose Monitoring Suppl KIT, Accucheck Guide with lancets and test strips #100 with one refill.  Test blood sugar twice daily. DX Code E11.65 ; Z79.4, Disp: 1 kit, Rfl: 0   Continuous Blood Gluc Receiver (FREESTYLE LIBRE 2 READER) DEVI, 1 each by Does not apply route daily., Disp: 1 each, Rfl: 0   Continuous Blood Gluc Sensor (FREESTYLE LIBRE 2 SENSOR) MISC, 1 each by Does not apply route every 14 (fourteen) days., Disp: 6 each, Rfl: 1   dapagliflozin propanediol (FARXIGA) 10 MG TABS tablet, Take 1 tablet (10 mg total) by mouth daily before breakfast. In place of glipizide, Disp: 90 tablet, Rfl: 1   Insulin Pen Needle 32G X 6 MM MISC, Daily, Disp: 100 each, Rfl: 3   lisinopril (ZESTRIL) 40 MG tablet, Take 1 tablet (40 mg total) by mouth daily., Disp: 90 tablet, Rfl: 3   Melatonin 1 MG CAPS, Take 1 capsule (1 mg total) by mouth at bedtime., Disp: 30 capsule, Rfl: 0   metoCLOPramide (REGLAN) 5 MG tablet, TAKE 1 TABLET BY MOUTH 3 TIMES DAILY BEFORE MEALS, Disp: 90 tablet, Rfl: 2   metoprolol succinate (TOPROL-XL) 25 MG 24 hr tablet, Take 1 tablet (25 mg total) by mouth daily., Disp: 30 tablet, Rfl: 3   Misc. Devices MISC, One pair of Compression stockings  Hemiplegia and hemiparesis following cerebral infarction affecting left non-dominant side (HCC)  - Primary Codes: U43.838 Lower extremity edema  Codes: R60.0 Type 2 diabetes mellitus with hyperglycemia, with long-term current use of insulin (HCC)  Codes: E11.65, Z79.4, Disp: 1 Units, Rfl: 0   omeprazole  (PRILOSEC) 40 MG capsule, TAKE 1 CAPSULE BY MOUTH IN THE MORNING AND AT BEDTIME, Disp: 90 capsule, Rfl: 1   polyethylene glycol (MIRALAX MIX-IN PAX) 17 g packet, Take 17 g by mouth daily., Disp: 14 each, Rfl: 3   senna-docusate (SENOKOT-S) 8.6-50 MG tablet, Take 2 tablets by mouth 2 (two) times daily., Disp: 120 tablet, Rfl: 0   sitaGLIPtin (JANUVIA) 50 MG tablet, Take 1 tablet (50 mg total) by mouth daily., Disp: 30 tablet, Rfl: 3    Assessment & Plan:  Dm a1c check today last checked 9/22 was 7.9  Is on janunvia and farxiga  check HbA1c,  urine  microalbumin  diabetic diet plan given to pt  adviced regarding hypoglycemia and instructions given to pt today on how to prevent and treat the same if it were to occur. pt acknowledges the plan and voices understanding of the same.  exercise plan given and encouraged.   advice diabetic yearly podiatry, ophthalmology , nutritionist , dental check q 6 months,   2.CVA : stable, fu and mx per neurology  3. Afib : stable, chronic fu and mx per cards.   4. Constipation Better now on  miralax andsenakot Will stop for now as he is having diarrhea sometime.s  Abdominal pain : ? to ovarian etiology vs constipation versus  Increase vegetables in diet.  Increase water to 8 - 10 glasses a day   Problem List Items Addressed This Visit       Cardiovascular and Mediastinum   Atrial fibrillation ([place])     Endocrine   Diabetes mellitus ([place])     Other   Muscle spasm of left lower extremity   Other Visit Diagnoses     Need for pneumococcal vaccination    -  Primary   Relevant Orders   Pneumococcal conjugate vaccine 13-valent IM        Orders Placed This Encounter  Procedures   Pneumococcal conjugate vaccine 13-valent IM     No orders of the defined types were placed in this encounter.    Follow up plan: No follow-ups on file.

## 2021-06-27 ENCOUNTER — Ambulatory Visit: Payer: Medicaid Other | Admitting: Internal Medicine

## 2021-06-27 ENCOUNTER — Ambulatory Visit (INDEPENDENT_AMBULATORY_CARE_PROVIDER_SITE_OTHER): Payer: Medicaid Other | Admitting: Internal Medicine

## 2021-06-27 ENCOUNTER — Other Ambulatory Visit: Payer: Self-pay

## 2021-06-27 ENCOUNTER — Encounter: Payer: Self-pay | Admitting: Internal Medicine

## 2021-06-27 VITALS — BP 187/97 | HR 56 | Temp 98.7°F | Ht 64.96 in | Wt 149.0 lb

## 2021-06-27 DIAGNOSIS — L97521 Non-pressure chronic ulcer of other part of left foot limited to breakdown of skin: Secondary | ICD-10-CM

## 2021-06-27 DIAGNOSIS — I10 Essential (primary) hypertension: Secondary | ICD-10-CM | POA: Insufficient documentation

## 2021-06-27 DIAGNOSIS — E11621 Type 2 diabetes mellitus with foot ulcer: Secondary | ICD-10-CM | POA: Diagnosis not present

## 2021-06-27 MED ORDER — AMLODIPINE BESYLATE 10 MG PO TABS
10.0000 mg | ORAL_TABLET | Freq: Every day | ORAL | 1 refills | Status: DC
Start: 2021-06-27 — End: 2022-02-25

## 2021-06-27 MED ORDER — CEPHALEXIN 500 MG PO CAPS: 500.0000 mg | ORAL_CAPSULE | Freq: Two times a day (BID) | ORAL | 0 refills | Status: AC

## 2021-06-27 NOTE — Progress Notes (Signed)
BP (!) 187/97   Pulse (!) 56   Temp 98.7 F (37.1 C) (Oral)   Ht 5' 4.96" (1.65 m)   Wt 149 lb (67.6 kg)   SpO2 99%   BMI 24.82 kg/m    Subjective:    Patient ID: Evan Cones Sr., male    DOB: 29-Jun-1956, 65 y.o.   MRN: [id number]  Chief Complaint  Patient presents with   Wound Check    Wound check for wound in between his 3rd and 4th toe on Left foot    HPI: Evan LARMER Sr. is a 65 y.o. male   patient is diabetic open wound noted on the anterior digital space of the left foot between the third and the fourth toes 7.5 down from 7.9 patient is on [place]  Wound Check He was originally treated 3 to 5 days ago. Previous treatment included wound cleansing or irrigation. There has been clear discharge from the wound. There is no redness present. There is no swelling present. The pain has not changed.   Chief Complaint  Patient presents with   Wound Check    Wound check for wound in between his 3rd and 4th toe on Left foot    Relevant past medical, surgical, family and social history reviewed and updated as indicated. Interim medical history since our last visit reviewed. Allergies and medications reviewed and updated.  Review of Systems  Per HPI unless specifically indicated above     Objective:    BP (!) 187/97   Pulse (!) 56   Temp 98.7 F (37.1 C) (Oral)   Ht 5' 4.96" (1.65 m)   Wt 149 lb (67.6 kg)   SpO2 99%   BMI 24.82 kg/m   Wt Readings from Last 3 Encounters:  06/27/21 149 lb (67.6 kg)  06/20/21 149 lb 3.2 oz (67.7 kg)  06/02/21 152 lb 6.4 oz (69.1 kg)    Physical Exam Constitutional:      Appearance: Normal appearance.  Skin:    Coloration: Skin is not jaundiced or pale.     Findings: Lesion present. No erythema.     Comments: Open wound noted between the third and the fourth toes on the left foot,  Neurological:     Mental Status: He is alert.     Motor: Weakness present.     Gait: Gait abnormal.    Results for orders  placed or performed in visit on 06/20/21  Bayer DCA Hb A1c Waived (STAT)  Result Value Ref Range   HB A1C (BAYER DCA - WAIVED) 7.5 (H) 4.8 - 5.6 %        Current Outpatient Medications:    ACCU-CHEK GUIDE test strip, Check fsbs once daily, Disp: 100 each, Rfl: 2   Accu-Chek Softclix Lancets lancets, SMARTSIG:Topical, Disp: 100 each, Rfl: 1   albuterol (VENTOLIN HFA) 108 (90 Base) MCG/ACT inhaler, Inhale 2 puffs into the lungs every 6 (six) hours as needed for wheezing or shortness of breath., Disp: 8 g, Rfl: 1   apixaban (ELIQUIS) 5 MG TABS tablet, Take 1 tablet (5 mg total) by mouth 2 (two) times daily., Disp: 60 tablet, Rfl: 1   atorvastatin (LIPITOR) 80 MG tablet, Take 1 tablet (80 mg total) by mouth daily., Disp: 90 tablet, Rfl: 1   baclofen (LIORESAL) 10 MG tablet, Take 1 tablet (10 mg total) by mouth 3 (three) times daily., Disp: 90 each, Rfl: 1   Blood Glucose Monitoring Suppl (FIFTY50 GLUCOSE METER 2.0) w/Device  KIT, Use as instructed, Disp: , Rfl:    Blood Glucose Monitoring Suppl KIT, Accucheck Guide with lancets and test strips #100 with one refill.  Test blood sugar twice daily. DX Code E11.65 ; Z79.4, Disp: 1 kit, Rfl: 0   cephALEXin (KEFLEX) 500 MG capsule, Take 1 capsule (500 mg total) by mouth 2 (two) times daily for 7 days., Disp: 14 capsule, Rfl: 0   Continuous Blood Gluc Sensor (FREESTYLE LIBRE 2 SENSOR) MISC, 1 each by Does not apply route every 14 (fourteen) days., Disp: 6 each, Rfl: 1   dapagliflozin propanediol (FARXIGA) 10 MG TABS tablet, Take 1 tablet (10 mg total) by mouth daily before breakfast. In place of glipizide, Disp: 90 tablet, Rfl: 1   Incontinence Supply Disposable (COMFORT PROTECT ADULT DIAPER/L) MISC, 1 Units by Does not apply route daily. Use 3-4 a day as needed, Disp: 90 each, Rfl: 3   Insulin Pen Needle 32G X 6 MM MISC, Daily, Disp: 100 each, Rfl: 3   lisinopril (ZESTRIL) 40 MG tablet, Take 1 tablet (40 mg total) by mouth daily., Disp: 90 tablet, Rfl:  3   Melatonin 1 MG CAPS, Take 1 capsule (1 mg total) by mouth at bedtime., Disp: 30 capsule, Rfl: 0   metoCLOPramide (REGLAN) 5 MG tablet, TAKE 1 TABLET BY MOUTH 3 TIMES DAILY BEFORE MEALS, Disp: 90 tablet, Rfl: 2   metoprolol succinate (TOPROL-XL) 25 MG 24 hr tablet, Take 1 tablet (25 mg total) by mouth daily., Disp: 30 tablet, Rfl: 3   Misc. Devices MISC, One pair of Compression stockings  Hemiplegia and hemiparesis following cerebral infarction affecting left non-dominant side (HCC)  - Primary Codes: N36.144 Lower extremity edema  Codes: R60.0 Type 2 diabetes mellitus with hyperglycemia, with long-term current use of insulin (HCC)  Codes: E11.65, Z79.4, Disp: 1 Units, Rfl: 0   omeprazole (PRILOSEC) 40 MG capsule, TAKE 1 CAPSULE BY MOUTH IN THE MORNING AND AT BEDTIME, Disp: 90 capsule, Rfl: 1   sitaGLIPtin (JANUVIA) 100 MG tablet, Take 1 tablet (100 mg total) by mouth daily., Disp: 30 tablet, Rfl: 6   amLODipine (NORVASC) 10 MG tablet, Take 1 tablet (10 mg total) by mouth daily., Disp: 90 tablet, Rfl: 1    Assessment & Plan:  Open wound and diabetic patient with a history of CVA will refer to podiatry and wound clinic. Patient will need antibiotic coverage on Keflex for 7 days Fu with me x 2 weeks    Htn stable to keep bp logs @ home.  Is on metoprolol and amlodipine HTN :  Continue current meds.  Medication compliance emphasised. pt advised to keep Bp logs. Pt verbalised understanding of the same. Pt to have a low salt diet . Exercise to reach a goal of at least 150 mins a week.  lifestyle modifications explained and pt understands importance of the above.  Problem List Items Addressed This Visit       Cardiovascular and Mediastinum   Primary hypertension   Relevant Medications   amLODipine (NORVASC) 10 MG tablet     Endocrine   Diabetic ulcer of toe of left foot associated with type 2 diabetes mellitus, limited to breakdown of skin ([place]) - Primary   Relevant Orders   Ambulatory  referral to [facility]   Ambulatory referral to Podiatry     Orders Placed This Encounter  Procedures   Ambulatory referral to [facility]   Ambulatory referral to Podiatry     Meds ordered this encounter  Medications  cephALEXin (KEFLEX) 500 MG capsule    Sig: Take 1 capsule (500 mg total) by mouth 2 (two) times daily for 7 days.    Dispense:  14 capsule    Refill:  0   amLODipine (NORVASC) 10 MG tablet    Sig: Take 1 tablet (10 mg total) by mouth daily.    Dispense:  90 tablet    Refill:  1     Follow up plan: Return in about 2 weeks (around 07/11/2021).

## 2021-07-01 ENCOUNTER — Ambulatory Visit: Payer: Medicaid Other | Admitting: Podiatry

## 2021-07-09 ENCOUNTER — Other Ambulatory Visit: Payer: Self-pay

## 2021-07-09 ENCOUNTER — Encounter (INDEPENDENT_AMBULATORY_CARE_PROVIDER_SITE_OTHER): Payer: Self-pay

## 2021-07-09 ENCOUNTER — Ambulatory Visit: Payer: Medicaid Other | Admitting: Podiatry

## 2021-07-09 DIAGNOSIS — E11621 Type 2 diabetes mellitus with foot ulcer: Secondary | ICD-10-CM | POA: Diagnosis not present

## 2021-07-09 DIAGNOSIS — B353 Tinea pedis: Secondary | ICD-10-CM | POA: Diagnosis not present

## 2021-07-12 MED ORDER — KETOCONAZOLE 2 % EX CREA: 1.0000 "application " | TOPICAL_CREAM | Freq: Every day | CUTANEOUS | 2 refills | Status: DC

## 2021-07-12 NOTE — Progress Notes (Signed)
  Subjective:  Patient ID: Evan Ano Sr., male    DOB: [birth year],  MRN: [id number]  Chief Complaint  Patient presents with   Diabetic Ulcer      diabetic ulcer of left foot-dr. avanti vigg refer *has appt at wound care center on 11/28*    65 y.o. male presents with the above complaint. History confirmed with patient. Referred for evaluation of ulcerated skin between toes  Objective:  Physical Exam: warm, good capillary refill, normal DP and PT pulses, and tinea pedis with interdigital maceration. No gross ulceration  Assessment:   1. Tinea pedis of both feet      Plan:  Patient was evaluated and treated and all questions answered.  Discussed the etiology and treatment options for tinea pedis.  Discussed topical and oral treatment.  Recommended topical treatment with 2% ketoconazole cream.  This was sent to the patient's pharmacy.  Also discussed appropriate foot hygiene, use of antifungal spray such as Tinactin in shoes, as well as cleaning her foot surfaces such as showers and bathroom floors with bleach.   Return in about 4 weeks (around 08/06/2021) for recheck athlete's foot .

## 2021-07-14 ENCOUNTER — Other Ambulatory Visit: Payer: Self-pay | Admitting: Internal Medicine

## 2021-07-14 ENCOUNTER — Ambulatory Visit: Payer: Medicaid Other | Admitting: Physician Assistant

## 2021-07-15 ENCOUNTER — Ambulatory Visit: Payer: Medicaid Other | Admitting: Internal Medicine

## 2021-07-15 ENCOUNTER — Encounter: Payer: Self-pay | Admitting: Internal Medicine

## 2021-07-15 ENCOUNTER — Other Ambulatory Visit: Payer: Self-pay

## 2021-07-15 VITALS — BP 148/90 | HR 55 | Temp 98.5°F | Ht 64.96 in | Wt 150.4 lb

## 2021-07-15 DIAGNOSIS — E118 Type 2 diabetes mellitus with unspecified complications: Secondary | ICD-10-CM | POA: Diagnosis not present

## 2021-07-15 MED ORDER — METFORMIN HCL 500 MG PO TABS: 500.0000 mg | ORAL_TABLET | Freq: Two times a day (BID) | ORAL | 3 refills | Status: DC

## 2021-07-15 MED ORDER — FEXOFENADINE HCL 180 MG PO TABS: 180.0000 mg | ORAL_TABLET | Freq: Every day | ORAL | 1 refills | Status: DC

## 2021-07-15 NOTE — Telephone Encounter (Signed)
Requested Prescriptions  Pending Prescriptions Disp Refills  . melatonin 1 MG TABS tablet [Pharmacy Med Name: MELATONIN 1 MG TAB] 30 tablet 1    Sig: TAKE 1 TABLET BY MOUTH AT BEDTIME     Over the Counter:  OTC Passed - 07/14/2021  5:55 PM      Passed - Valid encounter within last 12 months    Recent Outpatient Visits          2 weeks ago Diabetic ulcer of toe of left foot associated with type 2 diabetes mellitus, limited to breakdown of skin (HCC)   [facility] Vigg, Avanti, MD   3 weeks ago Need for pneumococcal vaccination   [facility] Vigg, Avanti, MD   1 month ago Need for influenza vaccination   [facility] Vigg, Avanti, MD   3 months ago Elevated alkaline phosphatase level   [facility] Vigg, Avanti, MD   4 months ago History of multiple strokes   [facility] Vigg, Avanti, MD      Future Appointments            Today Vigg, Avanti, MD [facility]   In 1 month Vigg, Avanti, MD [facility], [facility]   In 2 months Vigg, Avanti, MD [facility], [facility]

## 2021-07-15 NOTE — Progress Notes (Signed)
BP (!) 148/90   Pulse (!) 55   Temp 98.5 F (36.9 C) (Oral)   Ht 5' 4.96" (1.65 m)   Wt 150 lb 6.4 oz (68.2 kg)   SpO2 100%   BMI 25.06 kg/m    Subjective:    Patient ID: Evan Cones Sr., male    DOB: Jan 25, 1956, 65 y.o.   MRN: [id number]  Chief Complaint  Patient presents with   Ulcer on toe    Here for follow up on ulcer on his toe on left foot. Ulcer appears to be resolved    HPI: Evan PELLOT Sr. is a 65 y.o. male  HPI  Chief Complaint  Patient presents with   Ulcer on toe    Here for follow up on ulcer on his toe on left foot. Ulcer appears to be resolved    Relevant past medical, surgical, family and social history reviewed and updated as indicated. Interim medical history since our last visit reviewed. Allergies and medications reviewed and updated.  Review of Systems  Per HPI unless specifically indicated above     Objective:    BP (!) 148/90   Pulse (!) 55   Temp 98.5 F (36.9 C) (Oral)   Ht 5' 4.96" (1.65 m)   Wt 150 lb 6.4 oz (68.2 kg)   SpO2 100%   BMI 25.06 kg/m   Wt Readings from Last 3 Encounters:  07/15/21 150 lb 6.4 oz (68.2 kg)  06/27/21 149 lb (67.6 kg)  06/20/21 149 lb 3.2 oz (67.7 kg)    Physical Exam  Results for orders placed or performed in visit on 06/20/21  Bayer DCA Hb A1c Waived (STAT)  Result Value Ref Range   HB A1C (BAYER DCA - WAIVED) 7.5 (H) 4.8 - 5.6 %        Current Outpatient Medications:    ACCU-CHEK GUIDE test strip, Check fsbs once daily, Disp: 100 each, Rfl: 2   Accu-Chek Softclix Lancets lancets, SMARTSIG:Topical, Disp: 100 each, Rfl: 1   albuterol (VENTOLIN HFA) 108 (90 Base) MCG/ACT inhaler, Inhale 2 puffs into the lungs every 6 (six) hours as needed for wheezing or shortness of breath., Disp: 8 g, Rfl: 1   amLODipine (NORVASC) 10 MG tablet, Take 1 tablet (10 mg total) by mouth daily., Disp: 90 tablet, Rfl: 1   apixaban (ELIQUIS) 5 MG TABS tablet, Take 1 tablet (5 mg total) by mouth 2 (two) times  daily., Disp: 60 tablet, Rfl: 1   atorvastatin (LIPITOR) 80 MG tablet, Take 1 tablet (80 mg total) by mouth daily., Disp: 90 tablet, Rfl: 1   baclofen (LIORESAL) 10 MG tablet, Take 1 tablet (10 mg total) by mouth 3 (three) times daily., Disp: 90 each, Rfl: 1   Blood Glucose Monitoring Suppl (FIFTY50 GLUCOSE METER 2.0) w/Device KIT, Use as instructed, Disp: , Rfl:    Blood Glucose Monitoring Suppl KIT, Accucheck Guide with lancets and test strips #100 with one refill.  Test blood sugar twice daily. DX Code E11.65 ; Z79.4, Disp: 1 kit, Rfl: 0   Continuous Blood Gluc Sensor (FREESTYLE LIBRE 2 SENSOR) MISC, 1 each by Does not apply route every 14 (fourteen) days., Disp: 6 each, Rfl: 1   dapagliflozin propanediol (FARXIGA) 10 MG TABS tablet, Take 1 tablet (10 mg total) by mouth daily before breakfast. In place of glipizide, Disp: 90 tablet, Rfl: 1   Incontinence Supply Disposable (COMFORT PROTECT ADULT DIAPER/L) MISC, 1 Units by Does not apply route daily. Use 3-4  a day as needed, Disp: 90 each, Rfl: 3   Insulin Pen Needle 32G X 6 MM MISC, Daily, Disp: 100 each, Rfl: 3   ketoconazole (NIZORAL) 2 % cream, Apply 1 application topically daily., Disp: 60 g, Rfl: 2   lisinopril (ZESTRIL) 40 MG tablet, Take 1 tablet (40 mg total) by mouth daily., Disp: 90 tablet, Rfl: 3   melatonin 1 MG TABS tablet, TAKE 1 TABLET BY MOUTH AT BEDTIME, Disp: 30 tablet, Rfl: 1   metoCLOPramide (REGLAN) 5 MG tablet, TAKE 1 TABLET BY MOUTH 3 TIMES DAILY BEFORE MEALS, Disp: 90 tablet, Rfl: 2   metoprolol succinate (TOPROL-XL) 25 MG 24 hr tablet, Take 1 tablet (25 mg total) by mouth daily., Disp: 30 tablet, Rfl: 3   Misc. Devices MISC, One pair of Compression stockings  Hemiplegia and hemiparesis following cerebral infarction affecting left non-dominant side (HCC)  - Primary Codes: I77.824 Lower extremity edema  Codes: R60.0 Type 2 diabetes mellitus with hyperglycemia, with long-term current use of insulin (HCC)  Codes: E11.65, Z79.4,  Disp: 1 Units, Rfl: 0   omeprazole (PRILOSEC) 40 MG capsule, TAKE 1 CAPSULE BY MOUTH IN THE MORNING AND AT BEDTIME, Disp: 90 capsule, Rfl: 1   sitaGLIPtin (JANUVIA) 100 MG tablet, Take 1 tablet (100 mg total) by mouth daily., Disp: 30 tablet, Rfl: 6    Assessment & Plan:  DM : is on januvia 100 mg daily for such and farxiga for such  check HbA1c,  urine  microalbumin  diabetic diet plan given to pt  adviced regarding hypoglycemia and instructions given to pt today on how to prevent and treat the same if it were to occur. pt acknowledges the plan and voices understanding of the same.  exercise plan given and encouraged.   advice diabetic yearly podiatry, ophthalmology , nutritionist , dental check q 6 months,   2. Foot ulcer ? Sec to athletes feet per podiatrist Pt to fu with podiatry for such    Problem List Items Addressed This Visit   None    No orders of the defined types were placed in this encounter.    Meds ordered this encounter  Medications   metFORMIN (GLUCOPHAGE) 500 MG tablet    Sig: Take 1 tablet (500 mg total) by mouth 2 (two) times daily with a meal.    Dispense:  60 tablet    Refill:  3   fexofenadine (ALLEGRA ALLERGY) 180 MG tablet    Sig: Take 1 tablet (180 mg total) by mouth daily.    Dispense:  10 tablet    Refill:  1     Follow up plan: No follow-ups on file.

## 2021-07-15 NOTE — Patient Instructions (Signed)

## 2021-07-16 ENCOUNTER — Ambulatory Visit: Payer: Medicaid Other | Admitting: Internal Medicine

## 2021-07-16 DIAGNOSIS — M79674 Pain in right toe(s): Secondary | ICD-10-CM | POA: Diagnosis not present

## 2021-07-16 DIAGNOSIS — B351 Tinea unguium: Secondary | ICD-10-CM | POA: Diagnosis not present

## 2021-07-16 DIAGNOSIS — L600 Ingrowing nail: Secondary | ICD-10-CM | POA: Diagnosis not present

## 2021-07-16 DIAGNOSIS — E119 Type 2 diabetes mellitus without complications: Secondary | ICD-10-CM | POA: Diagnosis not present

## 2021-07-30 ENCOUNTER — Telehealth: Payer: Medicaid Other | Admitting: Internal Medicine

## 2021-07-31 ENCOUNTER — Ambulatory Visit: Payer: Medicaid Other | Admitting: *Deleted

## 2021-08-04 ENCOUNTER — Other Ambulatory Visit: Payer: Self-pay | Admitting: Internal Medicine

## 2021-08-04 DIAGNOSIS — K219 Gastro-esophageal reflux disease without esophagitis: Secondary | ICD-10-CM

## 2021-08-04 DIAGNOSIS — E1143 Type 2 diabetes mellitus with diabetic autonomic (poly)neuropathy: Secondary | ICD-10-CM

## 2021-08-04 NOTE — Telephone Encounter (Signed)
Requested Prescriptions  Pending Prescriptions Disp Refills   metoCLOPramide (REGLAN) 5 MG tablet [Pharmacy Med Name: METOCLOPRAMIDE HCL 5 MG TAB] 90 tablet 2    Sig: TAKE 1 TABLET BY MOUTH 3 TIMES DAILY BEFORE MEALS     Not Delegated - Gastroenterology: Antiemetics Failed - 08/04/2021 12:00 PM      Failed - This refill cannot be delegated      Passed - Valid encounter within last 6 months    Recent Outpatient Visits          2 weeks ago Controlled type 2 diabetes mellitus with complication, without long-term current use of insulin (HCC)   [facility] Vigg, Avanti, MD   1 month ago Diabetic ulcer of toe of left foot associated with type 2 diabetes mellitus, limited to breakdown of skin (HCC)   [facility] Vigg, Avanti, MD   1 month ago Need for pneumococcal vaccination   [facility] Vigg, Avanti, MD   2 months ago Need for influenza vaccination   [facility] Vigg, Avanti, MD   4 months ago Elevated alkaline phosphatase level   [facility] Vigg, Avanti, MD      Future Appointments            In 1 week Vigg, Avanti, MD [facility]   In 3 weeks Vigg, Avanti, MD [facility], [facility]   In 4 weeks Vigg, Avanti, MD [facility], [facility]   In 1 month Vigg, Avanti, MD [facility]            omeprazole (PRILOSEC) 40 MG capsule [Pharmacy Med Name: OMEPRAZOLE DR 40 MG CAP] 90 capsule 1    Sig: TAKE 1 CAPSULE BY MOUTH IN THE MORNING AND AT BEDTIME     Gastroenterology: Proton Pump Inhibitors Passed - 08/04/2021 12:00 PM      Passed - Valid encounter within last 12 months    Recent Outpatient Visits          2 weeks ago Controlled type 2 diabetes mellitus with complication, without long-term current use of insulin (HCC)   [facility] Vigg, Avanti, MD   1 month ago Diabetic ulcer of toe of left foot associated with type 2 diabetes mellitus, limited to  breakdown of skin (HCC)   [facility] Vigg, Avanti, MD   1 month ago Need for pneumococcal vaccination   [facility] Vigg, Avanti, MD   2 months ago Need for influenza vaccination   [facility] Vigg, Avanti, MD   4 months ago Elevated alkaline phosphatase level   [facility] Vigg, Avanti, MD      Future Appointments            In 1 week Vigg, Avanti, MD [facility]   In 3 weeks Vigg, Avanti, MD [facility], [facility]   In 4 weeks Vigg, Avanti, MD [facility], [facility]   In 1 month Vigg, Avanti, MD [facility], [facility]

## 2021-08-04 NOTE — Telephone Encounter (Signed)
Requested medication (s) are due for refill today: yes  Requested medication (s) are on the active medication list: yes  Last refill:  07/14/21  Future visit scheduled: 08/12/21  Notes to clinic:  This medication can not be delegated, please assess.    Requested Prescriptions  Pending Prescriptions Disp Refills   metoCLOPramide (REGLAN) 5 MG tablet [Pharmacy Med Name: METOCLOPRAMIDE HCL 5 MG TAB] 90 tablet 2    Sig: TAKE 1 TABLET BY MOUTH 3 TIMES DAILY BEFORE MEALS     Not Delegated - Gastroenterology: Antiemetics Failed - 08/04/2021 12:00 PM      Failed - This refill cannot be delegated      Passed - Valid encounter within last 6 months    Recent Outpatient Visits           2 weeks ago Controlled type 2 diabetes mellitus with complication, without long-term current use of insulin (HCC)   [facility] Vigg, Avanti, MD   1 month ago Diabetic ulcer of toe of left foot associated with type 2 diabetes mellitus, limited to breakdown of skin (HCC)   [facility] Vigg, Avanti, MD   1 month ago Need for pneumococcal vaccination   [facility] Vigg, Avanti, MD   2 months ago Need for influenza vaccination   [facility] Vigg, Avanti, MD   4 months ago Elevated alkaline phosphatase level   [facility] Vigg, Avanti, MD       Future Appointments             In 1 week Vigg, Avanti, MD [facility], [facility]   In 3 weeks Vigg, Avanti, MD [facility], [facility]   In 4 weeks Vigg, Avanti, MD [facility], [facility]   In 1 month Vigg, Avanti, MD [facility], [facility]            Signed Prescriptions Disp Refills   omeprazole (PRILOSEC) 40 MG capsule 90 capsule 1    Sig: TAKE 1 CAPSULE BY MOUTH IN THE MORNING AND AT BEDTIME     Gastroenterology: Proton Pump Inhibitors Passed - 08/04/2021 12:00 PM      Passed - Valid encounter within last 12 months    Recent Outpatient Visits           2  weeks ago Controlled type 2 diabetes mellitus with complication, without long-term current use of insulin (HCC)   [facility] Vigg, Avanti, MD   1 month ago Diabetic ulcer of toe of left foot associated with type 2 diabetes mellitus, limited to breakdown of skin (HCC)   [facility] Vigg, Avanti, MD   1 month ago Need for pneumococcal vaccination   [facility] Vigg, Avanti, MD   2 months ago Need for influenza vaccination   [facility] Vigg, Avanti, MD   4 months ago Elevated alkaline phosphatase level   [facility] Vigg, Avanti, MD       Future Appointments             In 1 week Vigg, Avanti, MD [facility], [facility]   In 3 weeks Vigg, Avanti, MD [facility], [facility]   In 4 weeks Vigg, Avanti, MD [facility], [facility]   In 1 month Vigg, Avanti, MD [facility], [facility]

## 2021-08-05 ENCOUNTER — Other Ambulatory Visit: Payer: Self-pay | Admitting: Internal Medicine

## 2021-08-05 DIAGNOSIS — K219 Gastro-esophageal reflux disease without esophagitis: Secondary | ICD-10-CM

## 2021-08-05 NOTE — Telephone Encounter (Signed)
Requested medication (s) are due for refill today -unknown  Requested medication (s) are on the active medication list -no  Future visit scheduled -yes  Last refill: -unknown  Notes to clinic: Request RF: medication prescribed by outside provider  Requested Prescriptions  Pending Prescriptions Disp Refills   SENNA PLUS 8.6-50 MG tablet [Pharmacy Med Name: SENNA PLUS 8.6-50 MG TAB] 120 tablet     Sig: TAKE 2 TABLETS BY MOUTH 2 TIMES DAILY     Over the Counter:  OTC Passed - 08/05/2021  2:08 PM      Passed - Valid encounter within last 12 months    Recent Outpatient Visits           3 weeks ago Controlled type 2 diabetes mellitus with complication, without long-term current use of insulin (HCC)   [facility] Vigg, Avanti, MD   1 month ago Diabetic ulcer of toe of left foot associated with type 2 diabetes mellitus, limited to breakdown of skin (HCC)   [facility] Vigg, Avanti, MD   1 month ago Need for pneumococcal vaccination   [facility] Vigg, Avanti, MD   2 months ago Need for influenza vaccination   [facility] Vigg, Avanti, MD   4 months ago Elevated alkaline phosphatase level   [facility] Vigg, Avanti, MD       Future Appointments             In 1 week Vigg, Avanti, MD [facility], [facility]   In 3 weeks Vigg, Avanti, MD [facility], [facility]   In 4 weeks Vigg, Avanti, MD [facility], [facility]   In 1 month Vigg, Avanti, MD [facility], [facility]               Requested Prescriptions  Pending Prescriptions Disp Refills   SENNA PLUS 8.6-50 MG tablet [Pharmacy Med Name: SENNA PLUS 8.6-50 MG TAB] 120 tablet     Sig: TAKE 2 TABLETS BY MOUTH 2 TIMES DAILY     Over the Counter:  OTC Passed - 08/05/2021  2:08 PM      Passed - Valid encounter within last 12 months    Recent Outpatient Visits           3 weeks ago Controlled type 2 diabetes mellitus with complication,  without long-term current use of insulin (HCC)   [facility] Vigg, Avanti, MD   1 month ago Diabetic ulcer of toe of left foot associated with type 2 diabetes mellitus, limited to breakdown of skin (HCC)   [facility] Vigg, Avanti, MD   1 month ago Need for pneumococcal vaccination   [facility] Vigg, Avanti, MD   2 months ago Need for influenza vaccination   [facility] Vigg, Avanti, MD   4 months ago Elevated alkaline phosphatase level   [facility] Vigg, Avanti, MD       Future Appointments             In 1 week Vigg, Avanti, MD [facility], [facility]   In 3 weeks Vigg, Avanti, MD [facility]   In 4 weeks Vigg, Avanti, MD [facility], [facility]   In 1 month Vigg, Avanti, MD [facility], [facility]

## 2021-08-05 NOTE — Telephone Encounter (Signed)
Patient takes Omeprazole twice/day so prescription amended for 90 day supply. Amlodipine dose has increased so Rx refusd Requested Prescriptions  Pending Prescriptions Disp Refills   omeprazole (PRILOSEC) 40 MG capsule [Pharmacy Med Name: OMEPRAZOLE DR 40 MG CAP] 180 capsule 1    Sig: TAKE 1 CAPSULE BY MOUTH IN THE MORNING AND AT BEDTIME     Gastroenterology: Proton Pump Inhibitors Passed - 08/05/2021 12:59 PM      Passed - Valid encounter within last 12 months    Recent Outpatient Visits          3 weeks ago Controlled type 2 diabetes mellitus with complication, without long-term current use of insulin (HCC)   [facility] Vigg, Avanti, MD   1 month ago Diabetic ulcer of toe of left foot associated with type 2 diabetes mellitus, limited to breakdown of skin (HCC)   [facility] Vigg, Avanti, MD   1 month ago Need for pneumococcal vaccination   [facility] Vigg, Avanti, MD   2 months ago Need for influenza vaccination   [facility] Vigg, Avanti, MD   4 months ago Elevated alkaline phosphatase level   [facility] Vigg, Avanti, MD      Future Appointments            In 1 week Vigg, Avanti, MD [facility]   In 3 weeks Vigg, Avanti, MD [facility]   In 4 weeks Vigg, Avanti, MD [facility], [facility]   In 1 month Vigg, Avanti, MD [facility], [facility]            amLODipine (NORVASC) 5 MG tablet [Pharmacy Med Name: AMLODIPINE BESYLATE 5 MG TAB] 90 tablet     Sig: TAKE 1 TABLET BY MOUTH DAILY     Cardiovascular:  Calcium Channel Blockers Failed - 08/05/2021 12:59 PM      Failed - Last BP in normal range    BP Readings from Last 1 Encounters:  07/15/21 (!) 148/90         Passed - Valid encounter within last 6 months    Recent Outpatient Visits          3 weeks ago Controlled type 2 diabetes mellitus with complication, without long-term current use of insulin  (HCC)   [facility] Vigg, Avanti, MD   1 month ago Diabetic ulcer of toe of left foot associated with type 2 diabetes mellitus, limited to breakdown of skin (HCC)   [facility] Vigg, Avanti, MD   1 month ago Need for pneumococcal vaccination   [facility] Vigg, Avanti, MD   2 months ago Need for influenza vaccination   [facility] Vigg, Avanti, MD   4 months ago Elevated alkaline phosphatase level   [facility] Vigg, Avanti, MD      Future Appointments            In 1 week Vigg, Avanti, MD [facility]   In 3 weeks Vigg, Avanti, MD [facility]   In 4 weeks Vigg, Avanti, MD [facility], [facility]   In 1 month Vigg, Avanti, MD [facility], [facility]

## 2021-08-05 NOTE — Telephone Encounter (Signed)
As per Dawn (pharmacist) omeprazole (PRILOSEC) 40 MG capsule script has to be changed to 180 tablet for a 90 day supply due to the frequency being  TAKE 1 CAPSULE BY MOUTH IN THE MORNING AND AT BEDTIME        Please send in a new script to   [facility] - [place], [place] - [address] Phone:  [phone]  Fax:  [phone]

## 2021-08-06 ENCOUNTER — Ambulatory Visit (INDEPENDENT_AMBULATORY_CARE_PROVIDER_SITE_OTHER): Payer: Medicaid Other | Admitting: Podiatry

## 2021-08-06 DIAGNOSIS — Z91199 Patient's noncompliance with other medical treatment and regimen due to unspecified reason: Secondary | ICD-10-CM

## 2021-08-07 ENCOUNTER — Ambulatory Visit: Payer: Medicaid Other | Admitting: *Deleted

## 2021-08-07 NOTE — Progress Notes (Signed)
Patient was no-show for appointment today 

## 2021-08-12 ENCOUNTER — Ambulatory Visit (INDEPENDENT_AMBULATORY_CARE_PROVIDER_SITE_OTHER): Payer: Medicaid Other | Admitting: Internal Medicine

## 2021-08-12 DIAGNOSIS — E119 Type 2 diabetes mellitus without complications: Secondary | ICD-10-CM

## 2021-08-12 DIAGNOSIS — I10 Essential (primary) hypertension: Secondary | ICD-10-CM

## 2021-08-12 NOTE — Progress Notes (Signed)
There were no vitals taken for this visit.   Subjective:    Patient ID: Evan Cones Sr., male    DOB: Feb 19, 1956, 65 y.o.   MRN: [id number]  No chief complaint on file.   HPI: Evan SMESTAD Sr. is a 65 y.o. male   This visit was completed via telephone due to the restrictions of the COVID-19 pandemic. All issues as above were discussed and addressed but no physical exam was performed. If it was felt that the patient should be evaluated in the office, they were directed there. The patient verbally consented to this visit. Patient was unable to complete an audio/visual visit due to Technical difficulties. Due to the catastrophic nature of the COVID-19 pandemic, this visit was done through audio contact only. Location of the patient: home Location of the provider: work Those involved with this call:  Provider: Charlynne Cousins, MD CMA: Frazier Butt, [place] Desk/Registration: [employer]  Time spent on call: 10 minutes on the phone discussing health concerns. 10 minutes total spent in review of patient's record and preparation of their chart.    Diabetes He presents for his follow-up (120 FSBS) diabetic visit. He has type 2 diabetes mellitus. His disease course has been improving. Pertinent negatives for hypoglycemia include no confusion or mood changes. Pertinent negatives for diabetes include no blurred vision, no chest pain, no fatigue, no foot paresthesias, no foot ulcerations, no polydipsia, no polyphagia, no polyuria, no visual change, no weakness and no weight loss.  Hypertension This is a new (140/ 87 mmHg) problem. Pertinent negatives include no blurred vision or chest pain.   No chief complaint on file.   Relevant past medical, surgical, family and social history reviewed and updated as indicated. Interim medical history since our last visit reviewed. Allergies and medications reviewed and updated.  Review of Systems  Constitutional:  Negative for fatigue and weight  loss.  Eyes:  Negative for blurred vision.  Cardiovascular:  Negative for chest pain.  Endocrine: Negative for polydipsia, polyphagia and polyuria.  Neurological:  Negative for weakness.  Psychiatric/Behavioral:  Negative for confusion.    Per HPI unless specifically indicated above     Objective:    There were no vitals taken for this visit.  Wt Readings from Last 3 Encounters:  07/15/21 150 lb 6.4 oz (68.2 kg)  06/27/21 149 lb (67.6 kg)  06/20/21 149 lb 3.2 oz (67.7 kg)    Physical Exam  Unable to peform sec to virtual visit.   Results for orders placed or performed in visit on 06/20/21  Bayer DCA Hb A1c Waived (STAT)  Result Value Ref Range   HB A1C (BAYER DCA - WAIVED) 7.5 (H) 4.8 - 5.6 %        Current Outpatient Medications:    ACCU-CHEK GUIDE test strip, Check fsbs once daily, Disp: 100 each, Rfl: 2   Accu-Chek Softclix Lancets lancets, SMARTSIG:Topical, Disp: 100 each, Rfl: 1   albuterol (VENTOLIN HFA) 108 (90 Base) MCG/ACT inhaler, Inhale 2 puffs into the lungs every 6 (six) hours as needed for wheezing or shortness of breath., Disp: 8 g, Rfl: 1   amLODipine (NORVASC) 10 MG tablet, Take 1 tablet (10 mg total) by mouth daily., Disp: 90 tablet, Rfl: 1   apixaban (ELIQUIS) 5 MG TABS tablet, Take 1 tablet (5 mg total) by mouth 2 (two) times daily., Disp: 60 tablet, Rfl: 1   atorvastatin (LIPITOR) 80 MG tablet, Take 1 tablet (80 mg total) by mouth daily.,  90 tablet, Rfl: 1 °•  baclofen (LIORESAL) 10 MG tablet, Take 1 tablet (10 mg total) by mouth 3 (three) times daily., Disp: 90 each, Rfl: 1 °•  Blood Glucose Monitoring Suppl (FIFTY50 GLUCOSE METER 2.0) w/Device KIT, Use as instructed, Disp: , Rfl:  °•  Blood Glucose Monitoring Suppl KIT, Accucheck Guide with lancets and test strips #100 with one refill.  Test blood sugar twice daily. DX Code E11.65 ; Z79.4, Disp: 1 kit, Rfl: 0 °•  Continuous Blood Gluc Sensor (FREESTYLE LIBRE 2 SENSOR) MISC, 1 each by Does not  apply route every 14 (fourteen) days., Disp: 6 each, Rfl: 1 °•  dapagliflozin propanediol (FARXIGA) 10 MG TABS tablet, Take 1 tablet (10 mg total) by mouth daily before breakfast. In place of glipizide, Disp: 90 tablet, Rfl: 1 °•  fexofenadine (ALLEGRA ALLERGY) 180 MG tablet, Take 1 tablet (180 mg total) by mouth daily., Disp: 10 tablet, Rfl: 1 °•  Incontinence Supply Disposable (COMFORT PROTECT ADULT DIAPER/L) MISC, 1 Units by Does not apply route daily. Use 3-4 a day as needed, Disp: 90 each, Rfl: 3 °•  Insulin Pen Needle 32G X 6 MM MISC, Daily, Disp: 100 each, Rfl: 3 °•  ketoconazole (NIZORAL) 2 % cream, Apply 1 application topically daily., Disp: 60 g, Rfl: 2 °•  lisinopril (ZESTRIL) 40 MG tablet, Take 1 tablet (40 mg total) by mouth daily., Disp: 90 tablet, Rfl: 3 °•  melatonin 1 MG TABS tablet, TAKE 1 TABLET BY MOUTH AT BEDTIME, Disp: 30 tablet, Rfl: 1 °•  metFORMIN (GLUCOPHAGE) 500 MG tablet, Take 1 tablet (500 mg total) by mouth 2 (two) times daily with a meal., Disp: 60 tablet, Rfl: 3 °•  metoCLOPramide (REGLAN) 5 MG tablet, TAKE 1 TABLET BY MOUTH 3 TIMES DAILY BEFORE MEALS, Disp: 90 tablet, Rfl: 0 °•  metoprolol succinate (TOPROL-XL) 25 MG 24 hr tablet, Take 1 tablet (25 mg total) by mouth daily., Disp: 30 tablet, Rfl: 3 °•  Misc. Devices MISC, One pair of Compression stockings  Hemiplegia and hemiparesis following cerebral infarction affecting left non-dominant side (HCC)  - Primary Codes: I69.354 Lower extremity edema  Codes: R60.0 Type 2 diabetes mellitus with hyperglycemia, with long-term current use of insulin (HCC)  Codes: E11.65, Z79.4, Disp: 1 Units, Rfl: 0 °•  omeprazole (PRILOSEC) 40 MG capsule, TAKE 1 CAPSULE BY MOUTH IN THE MORNING AND AT BEDTIME, Disp: 180 capsule, Rfl: 1 °•  SENNA PLUS 8.6-50 MG tablet, TAKE 2 TABLETS BY MOUTH 2 TIMES DAILY, Disp: 360 tablet, Rfl: 4 °•  sitaGLIPtin (JANUVIA) 100 MG tablet, Take 1 tablet (100 mg total) by mouth daily., Disp: 30 tablet, Rfl: 6  ° ° °Assessment  & Plan:  °Dm is on metformin januvia for such a1c much improved from 7.9 -- 7.5 °check HbA1c,  urine  microalbumin  diabetic diet plan given to pt  adviced regarding hypoglycemia and instructions given to pt today on how to prevent and treat the same if it were to occur. pt acknowledges the plan and voices understanding of the same.  exercise plan given and encouraged.   advice diabetic yearly podiatry, ophthalmology , nutritionist , dental check q 6 months ° °Htn ;  °Improving, is on norvasc/ lisinopril Continue current meds.  Medication compliance emphasised. pt advised to keep Bp logs. Pt verbalised understanding of the same. Pt to have a low salt diet . Exercise to reach a goal of at least 150 mins a week.  lifestyle modifications explained and pt understands importance of   of the above.  Problem List Items Addressed This Visit   None    No orders of the defined types were placed in this encounter.    No orders of the defined types were placed in this encounter.    Follow up plan: No follow-ups on file.

## 2021-08-14 ENCOUNTER — Encounter: Payer: Self-pay | Admitting: Internal Medicine

## 2021-08-26 ENCOUNTER — Ambulatory Visit: Payer: Medicaid Other | Admitting: Internal Medicine

## 2021-09-02 ENCOUNTER — Ambulatory Visit: Payer: Medicaid Other | Admitting: Internal Medicine

## 2021-09-14 ENCOUNTER — Other Ambulatory Visit: Payer: Self-pay

## 2021-09-14 ENCOUNTER — Encounter: Payer: Self-pay | Admitting: Emergency Medicine

## 2021-09-14 ENCOUNTER — Inpatient Hospital Stay
Admission: EM | Admit: 2021-09-14 | Discharge: 2021-09-16 | DRG: 305 | Disposition: A | Discharge status: Home / Self Care | Payer: Medicaid Other | Attending: Osteopathic Medicine | Admitting: Osteopathic Medicine

## 2021-09-14 ENCOUNTER — Emergency Department: Payer: Medicaid Other

## 2021-09-14 DIAGNOSIS — D649 Anemia, unspecified: Secondary | ICD-10-CM

## 2021-09-14 DIAGNOSIS — N182 Chronic kidney disease, stage 2 (mild): Secondary | ICD-10-CM | POA: Diagnosis present

## 2021-09-14 DIAGNOSIS — I1310 Hypertensive heart and chronic kidney disease without heart failure, with stage 1 through stage 4 chronic kidney disease, or unspecified chronic kidney disease: Secondary | ICD-10-CM | POA: Diagnosis present

## 2021-09-14 DIAGNOSIS — Z823 Family history of stroke: Secondary | ICD-10-CM

## 2021-09-14 DIAGNOSIS — Z87891 Personal history of nicotine dependence: Secondary | ICD-10-CM

## 2021-09-14 DIAGNOSIS — E1165 Type 2 diabetes mellitus with hyperglycemia: Secondary | ICD-10-CM

## 2021-09-14 DIAGNOSIS — Z79899 Other long term (current) drug therapy: Secondary | ICD-10-CM

## 2021-09-14 DIAGNOSIS — I10 Essential (primary) hypertension: Secondary | ICD-10-CM | POA: Diagnosis present

## 2021-09-14 DIAGNOSIS — I200 Unstable angina: Secondary | ICD-10-CM | POA: Insufficient documentation

## 2021-09-14 DIAGNOSIS — I69354 Hemiplegia and hemiparesis following cerebral infarction affecting left non-dominant side: Secondary | ICD-10-CM

## 2021-09-14 DIAGNOSIS — I771 Stricture of artery: Secondary | ICD-10-CM | POA: Diagnosis not present

## 2021-09-14 DIAGNOSIS — Z7901 Long term (current) use of anticoagulants: Secondary | ICD-10-CM | POA: Diagnosis not present

## 2021-09-14 DIAGNOSIS — R338 Other retention of urine: Secondary | ICD-10-CM | POA: Diagnosis not present

## 2021-09-14 DIAGNOSIS — E1322 Other specified diabetes mellitus with diabetic chronic kidney disease: Secondary | ICD-10-CM | POA: Diagnosis not present

## 2021-09-14 DIAGNOSIS — I6521 Occlusion and stenosis of right carotid artery: Secondary | ICD-10-CM | POA: Diagnosis not present

## 2021-09-14 DIAGNOSIS — N3289 Other specified disorders of bladder: Secondary | ICD-10-CM

## 2021-09-14 DIAGNOSIS — N401 Enlarged prostate with lower urinary tract symptoms: Secondary | ICD-10-CM | POA: Diagnosis not present

## 2021-09-14 DIAGNOSIS — R918 Other nonspecific abnormal finding of lung field: Secondary | ICD-10-CM | POA: Diagnosis not present

## 2021-09-14 DIAGNOSIS — I4891 Unspecified atrial fibrillation: Secondary | ICD-10-CM

## 2021-09-14 DIAGNOSIS — Z7984 Long term (current) use of oral hypoglycemic drugs: Secondary | ICD-10-CM | POA: Diagnosis not present

## 2021-09-14 DIAGNOSIS — E1122 Type 2 diabetes mellitus with diabetic chronic kidney disease: Secondary | ICD-10-CM

## 2021-09-14 DIAGNOSIS — M5127 Other intervertebral disc displacement, lumbosacral region: Secondary | ICD-10-CM | POA: Diagnosis not present

## 2021-09-14 DIAGNOSIS — Z8249 Family history of ischemic heart disease and other diseases of the circulatory system: Secondary | ICD-10-CM | POA: Diagnosis not present

## 2021-09-14 DIAGNOSIS — I480 Paroxysmal atrial fibrillation: Secondary | ICD-10-CM | POA: Diagnosis not present

## 2021-09-14 DIAGNOSIS — E7800 Pure hypercholesterolemia, unspecified: Secondary | ICD-10-CM | POA: Diagnosis present

## 2021-09-14 DIAGNOSIS — E785 Hyperlipidemia, unspecified: Secondary | ICD-10-CM | POA: Diagnosis present

## 2021-09-14 DIAGNOSIS — I4510 Unspecified right bundle-branch block: Secondary | ICD-10-CM | POA: Diagnosis not present

## 2021-09-14 DIAGNOSIS — R911 Solitary pulmonary nodule: Secondary | ICD-10-CM

## 2021-09-14 DIAGNOSIS — I130 Hypertensive heart and chronic kidney disease with heart failure and stage 1 through stage 4 chronic kidney disease, or unspecified chronic kidney disease: Secondary | ICD-10-CM | POA: Diagnosis not present

## 2021-09-14 DIAGNOSIS — E119 Type 2 diabetes mellitus without complications: Secondary | ICD-10-CM

## 2021-09-14 DIAGNOSIS — I2510 Atherosclerotic heart disease of native coronary artery without angina pectoris: Secondary | ICD-10-CM | POA: Diagnosis present

## 2021-09-14 DIAGNOSIS — Z794 Long term (current) use of insulin: Secondary | ICD-10-CM | POA: Diagnosis not present

## 2021-09-14 DIAGNOSIS — E1169 Type 2 diabetes mellitus with other specified complication: Secondary | ICD-10-CM

## 2021-09-14 DIAGNOSIS — Z833 Family history of diabetes mellitus: Secondary | ICD-10-CM

## 2021-09-14 DIAGNOSIS — I482 Chronic atrial fibrillation, unspecified: Secondary | ICD-10-CM | POA: Diagnosis present

## 2021-09-14 DIAGNOSIS — I161 Hypertensive emergency: Secondary | ICD-10-CM | POA: Diagnosis not present

## 2021-09-14 DIAGNOSIS — Z20822 Contact with and (suspected) exposure to covid-19: Secondary | ICD-10-CM | POA: Diagnosis not present

## 2021-09-14 DIAGNOSIS — I509 Heart failure, unspecified: Secondary | ICD-10-CM | POA: Diagnosis not present

## 2021-09-14 DIAGNOSIS — R1111 Vomiting without nausea: Secondary | ICD-10-CM | POA: Diagnosis not present

## 2021-09-14 DIAGNOSIS — R112 Nausea with vomiting, unspecified: Secondary | ICD-10-CM | POA: Diagnosis not present

## 2021-09-14 DIAGNOSIS — R0789 Other chest pain: Secondary | ICD-10-CM | POA: Diagnosis not present

## 2021-09-14 DIAGNOSIS — R079 Chest pain, unspecified: Secondary | ICD-10-CM | POA: Diagnosis not present

## 2021-09-14 DIAGNOSIS — I700 Atherosclerosis of aorta: Secondary | ICD-10-CM | POA: Diagnosis not present

## 2021-09-14 DIAGNOSIS — I639 Cerebral infarction, unspecified: Secondary | ICD-10-CM | POA: Diagnosis present

## 2021-09-14 DIAGNOSIS — K7689 Other specified diseases of liver: Secondary | ICD-10-CM | POA: Diagnosis not present

## 2021-09-14 DIAGNOSIS — I774 Celiac artery compression syndrome: Secondary | ICD-10-CM | POA: Diagnosis not present

## 2021-09-14 LAB — CBC
HCT: 38 % — ABNORMAL LOW (ref 39.0–52.0)
Hemoglobin: 12.3 g/dL — ABNORMAL LOW (ref 13.0–17.0)
MCH: 28.5 pg (ref 26.0–34.0)
MCHC: 32.4 g/dL (ref 30.0–36.0)
MCV: 88 fL (ref 80.0–100.0)
Platelets: 284 10*3/uL (ref 150–400)
RBC: 4.32 MIL/uL (ref 4.22–5.81)
RDW: 15.9 % — ABNORMAL HIGH (ref 11.5–15.5)
WBC: 6.7 10*3/uL (ref 4.0–10.5)
nRBC: 0 % (ref 0.0–0.2)

## 2021-09-14 LAB — BASIC METABOLIC PANEL
Anion gap: 12 (ref 5–15)
BUN: 18 mg/dL (ref 8–23)
CO2: 24 mmol/L (ref 22–32)
Calcium: 9.6 mg/dL (ref 8.9–10.3)
Chloride: 105 mmol/L (ref 98–111)
Creatinine, Ser: 1.21 mg/dL (ref 0.61–1.24)
GFR, Estimated: >60 mL/min (ref >60)
Glucose, Bld: 227 mg/dL — ABNORMAL HIGH (ref 70–99)
Potassium: 3.3 mmol/L — ABNORMAL LOW (ref 3.5–5.1)
Sodium: 141 mmol/L (ref 135–145)

## 2021-09-14 LAB — RESP PANEL BY RT-PCR (FLU A&B, COVID) ARPGX2
Influenza A by PCR: NEGATIVE
Influenza B by PCR: NEGATIVE
SARS Coronavirus 2 by RT PCR: NEGATIVE

## 2021-09-14 LAB — TROPONIN I (HIGH SENSITIVITY)
Troponin I (High Sensitivity): 8 ng/L (ref <18)
Troponin I (High Sensitivity): 4 ng/L (ref <18)
Troponin I (High Sensitivity): 8 ng/L (ref <18)
Troponin I (High Sensitivity): 8 ng/L (ref <18)

## 2021-09-14 LAB — GLUCOSE, CAPILLARY
Glucose-Capillary: 248 mg/dL — ABNORMAL HIGH (ref 70–99)
Glucose-Capillary: 202 mg/dL — ABNORMAL HIGH (ref 70–99)

## 2021-09-14 LAB — HEPATIC FUNCTION PANEL
ALT: 56 U/L — ABNORMAL HIGH (ref 0–44)
AST: 33 U/L (ref 15–41)
Albumin: 3.9 g/dL (ref 3.5–5.0)
Alkaline Phosphatase: 180 U/L — ABNORMAL HIGH (ref 38–126)
Bilirubin, Direct: <0.1 mg/dL (ref 0.0–0.2)
Total Bilirubin: 0.6 mg/dL (ref 0.3–1.2)
Total Protein: 7.9 g/dL (ref 6.5–8.1)

## 2021-09-14 LAB — PROTIME-INR
INR: 1 (ref 0.8–1.2)
Prothrombin Time: 13.4 seconds (ref 11.4–15.2)

## 2021-09-14 LAB — TSH: TSH: 0.977 u[IU]/mL (ref 0.350–4.500)

## 2021-09-14 LAB — MRSA NEXT GEN BY PCR, NASAL: MRSA by PCR Next Gen: NOT DETECTED

## 2021-09-14 LAB — LIPASE, BLOOD: Lipase: 39 U/L (ref 11–51)

## 2021-09-14 LAB — APTT: aPTT: 31 seconds (ref 24–36)

## 2021-09-14 MED ORDER — MORPHINE SULFATE (PF) 4 MG/ML IV SOLN
4.0000 mg | Freq: Once | INTRAVENOUS | Status: AC
  Administered 2021-09-14: 4 mg via INTRAVENOUS
  Filled 2021-09-14: qty 1

## 2021-09-14 MED ORDER — ONDANSETRON HCL 4 MG/2ML IJ SOLN
4.0000 mg | Freq: Once | INTRAMUSCULAR | Status: AC
Start: 2021-09-14 — End: 2021-09-14
  Administered 2021-09-14: 4 mg via INTRAVENOUS
  Filled 2021-09-14: qty 2

## 2021-09-14 MED ORDER — AMLODIPINE BESYLATE 10 MG PO TABS
10.0000 mg | ORAL_TABLET | Freq: Every day | ORAL | Status: DC
  Administered 2021-09-14 – 2021-09-16 (×3): 10 mg via ORAL
  Filled 2021-09-14 (×3): qty 1

## 2021-09-14 MED ORDER — ACETAMINOPHEN 325 MG PO TABS: 650.0000 mg | ORAL_TABLET | Freq: Four times a day (QID) | ORAL | Status: DC | PRN

## 2021-09-14 MED ORDER — APIXABAN 5 MG PO TABS
5.0000 mg | ORAL_TABLET | Freq: Two times a day (BID) | ORAL | Status: DC
  Administered 2021-09-14 – 2021-09-16 (×4): 5 mg via ORAL
  Filled 2021-09-14 (×4): qty 1

## 2021-09-14 MED ORDER — HEPARIN BOLUS VIA INFUSION
4000.0000 [IU] | Freq: Once | INTRAVENOUS | Status: AC
  Administered 2021-09-14: 4000 [IU] via INTRAVENOUS
  Filled 2021-09-14: qty 4000

## 2021-09-14 MED ORDER — HYDRALAZINE HCL 10 MG PO TABS
10.0000 mg | ORAL_TABLET | Freq: Three times a day (TID) | ORAL | Status: DC | PRN
  Administered 2021-09-14 – 2021-09-15 (×2): 10 mg via ORAL
  Filled 2021-09-14 (×4): qty 1

## 2021-09-14 MED ORDER — HYDRALAZINE HCL 20 MG/ML IJ SOLN
2.0000 mg | Freq: Once | INTRAMUSCULAR | Status: AC
  Administered 2021-09-14: 2 mg via INTRAVENOUS
  Filled 2021-09-14: qty 1

## 2021-09-14 MED ORDER — NITROGLYCERIN 0.4 MG SL SUBL: 0.4000 mg | SUBLINGUAL_TABLET | SUBLINGUAL | Status: DC | PRN

## 2021-09-14 MED ORDER — DAPAGLIFLOZIN PROPANEDIOL 5 MG PO TABS
10.0000 mg | ORAL_TABLET | Freq: Every day | ORAL | Status: DC
  Administered 2021-09-15 – 2021-09-16 (×2): 10 mg via ORAL
  Filled 2021-09-14: qty 2
  Filled 2021-09-14 (×2): qty 1

## 2021-09-14 MED ORDER — INSULIN ASPART 100 UNIT/ML IJ SOLN
0.0000 [IU] | Freq: Three times a day (TID) | INTRAMUSCULAR | Status: DC
  Administered 2021-09-14: 3 [IU] via SUBCUTANEOUS
  Administered 2021-09-15 (×2): 2 [IU] via SUBCUTANEOUS
  Administered 2021-09-16 (×2): 1 [IU] via SUBCUTANEOUS
  Filled 2021-09-14 (×5): qty 1

## 2021-09-14 MED ORDER — ACETAMINOPHEN 650 MG RE SUPP: 650.0000 mg | Freq: Four times a day (QID) | RECTAL | Status: DC | PRN

## 2021-09-14 MED ORDER — MELATONIN 5 MG PO TABS
2.5000 mg | ORAL_TABLET | Freq: Every day | ORAL | Status: DC
  Administered 2021-09-15: 2.5 mg via ORAL
  Filled 2021-09-14 (×2): qty 1

## 2021-09-14 MED ORDER — LORATADINE 10 MG PO TABS
10.0000 mg | ORAL_TABLET | Freq: Every day | ORAL | Status: DC
  Administered 2021-09-15 – 2021-09-16 (×2): 10 mg via ORAL
  Filled 2021-09-14 (×2): qty 1

## 2021-09-14 MED ORDER — INSULIN ASPART 100 UNIT/ML IJ SOLN
0.0000 [IU] | Freq: Every day | INTRAMUSCULAR | Status: DC
  Administered 2021-09-14: 2 [IU] via SUBCUTANEOUS
  Filled 2021-09-14: qty 1

## 2021-09-14 MED ORDER — POTASSIUM CHLORIDE CRYS ER 20 MEQ PO TBCR
40.0000 meq | EXTENDED_RELEASE_TABLET | Freq: Once | ORAL | Status: AC
  Administered 2021-09-14: 40 meq via ORAL
  Filled 2021-09-14: qty 2

## 2021-09-14 MED ORDER — ASPIRIN EC 81 MG PO TBEC
81.0000 mg | DELAYED_RELEASE_TABLET | Freq: Every day | ORAL | Status: DC
  Administered 2021-09-14 – 2021-09-16 (×3): 81 mg via ORAL
  Filled 2021-09-14 (×3): qty 1

## 2021-09-14 MED ORDER — PANTOPRAZOLE SODIUM 40 MG PO TBEC
40.0000 mg | DELAYED_RELEASE_TABLET | Freq: Every day | ORAL | Status: DC
  Administered 2021-09-15 – 2021-09-16 (×2): 40 mg via ORAL
  Filled 2021-09-14 (×2): qty 1

## 2021-09-14 MED ORDER — METOPROLOL SUCCINATE ER 50 MG PO TB24: 25.0000 mg | ORAL_TABLET | Freq: Every day | ORAL | Status: DC

## 2021-09-14 MED ORDER — IOHEXOL 350 MG/ML SOLN
100.0000 mL | Freq: Once | INTRAVENOUS | Status: AC | PRN
  Administered 2021-09-14: 100 mL via INTRAVENOUS

## 2021-09-14 MED ORDER — PROMETHAZINE HCL 25 MG PO TABS
12.5000 mg | ORAL_TABLET | Freq: Three times a day (TID) | ORAL | Status: DC | PRN
  Administered 2021-09-14: 12.5 mg via ORAL
  Filled 2021-09-14 (×2): qty 1

## 2021-09-14 MED ORDER — SENNA 8.6 MG PO TABS
1.0000 | ORAL_TABLET | Freq: Two times a day (BID) | ORAL | Status: DC
  Administered 2021-09-15 – 2021-09-16 (×3): 8.6 mg via ORAL
  Filled 2021-09-14 (×4): qty 1

## 2021-09-14 MED ORDER — ATORVASTATIN CALCIUM 80 MG PO TABS
80.0000 mg | ORAL_TABLET | Freq: Every day | ORAL | Status: DC
  Administered 2021-09-15 – 2021-09-16 (×2): 80 mg via ORAL
  Filled 2021-09-14: qty 1
  Filled 2021-09-14: qty 4

## 2021-09-14 MED ORDER — OXYCODONE HCL 5 MG PO TABS
5.0000 mg | ORAL_TABLET | ORAL | Status: DC | PRN
  Filled 2021-09-14: qty 1

## 2021-09-14 MED ORDER — METOCLOPRAMIDE HCL 10 MG PO TABS
5.0000 mg | ORAL_TABLET | Freq: Three times a day (TID) | ORAL | Status: DC
  Administered 2021-09-15 – 2021-09-16 (×5): 5 mg via ORAL
  Filled 2021-09-14 (×7): qty 1

## 2021-09-14 MED ORDER — LISINOPRIL 20 MG PO TABS: 40.0000 mg | ORAL_TABLET | Freq: Every day | ORAL | Status: DC

## 2021-09-14 MED ORDER — HEPARIN (PORCINE) 25000 UT/250ML-% IV SOLN
800.0000 [IU]/h | INTRAVENOUS | Status: DC
  Administered 2021-09-14: 800 [IU]/h via INTRAVENOUS
  Filled 2021-09-14: qty 250

## 2021-09-14 MED ORDER — ALBUTEROL SULFATE (2.5 MG/3ML) 0.083% IN NEBU: 3.0000 mL | INHALATION_SOLUTION | Freq: Four times a day (QID) | RESPIRATORY_TRACT | Status: DC | PRN

## 2021-09-14 MED ORDER — CHLORHEXIDINE GLUCONATE CLOTH 2 % EX PADS
6.0000 | MEDICATED_PAD | Freq: Every day | CUTANEOUS | Status: DC
  Administered 2021-09-15 (×2): 6 via TOPICAL

## 2021-09-14 MED ORDER — BACLOFEN 10 MG PO TABS
10.0000 mg | ORAL_TABLET | Freq: Three times a day (TID) | ORAL | Status: DC
  Administered 2021-09-14 – 2021-09-16 (×4): 10 mg via ORAL
  Filled 2021-09-14 (×8): qty 1

## 2021-09-14 MED ORDER — NITROGLYCERIN IN D5W 200-5 MCG/ML-% IV SOLN
0.0000 ug/min | INTRAVENOUS | Status: DC
  Administered 2021-09-14: 5 ug/min via INTRAVENOUS
  Filled 2021-09-14: qty 250

## 2021-09-14 MED ORDER — SODIUM CHLORIDE 0.9% FLUSH
3.0000 mL | Freq: Two times a day (BID) | INTRAVENOUS | Status: DC
  Administered 2021-09-14 – 2021-09-16 (×4): 3 mL via INTRAVENOUS

## 2021-09-14 MED ORDER — AMLODIPINE BESYLATE 10 MG PO TABS: 10.0000 mg | ORAL_TABLET | Freq: Every day | ORAL | Status: DC

## 2021-09-14 MED ORDER — DOCUSATE SODIUM 100 MG PO CAPS
100.0000 mg | ORAL_CAPSULE | Freq: Two times a day (BID) | ORAL | Status: DC
  Administered 2021-09-15 – 2021-09-16 (×3): 100 mg via ORAL
  Filled 2021-09-14 (×4): qty 1

## 2021-09-14 MED ORDER — INSULIN ASPART 100 UNIT/ML IJ SOLN: 0.0000 [IU] | Freq: Three times a day (TID) | INTRAMUSCULAR | Status: DC

## 2021-09-14 NOTE — H&P (Addendum)
History and Physical    Evan Townsend ZOX:[id number] DOB: 11-Sep-1955 DOA: 09/14/2021  PCP: Charlynne Cousins, MD  Patient coming from: Home  Chief Complaint: Chest pain, left sided  HPI: Evan BUXTON Sr. is a 65 y.o. male with medical history significant of recurrent strokes, mild carotid stenosis, diastolic dysfunction without failure, HLD, HTN, pAF on eliquis who presented with chest pain, left sided, while playing cards, pressure like in nature.  Did not radiate to arm or jaw.  + nausea.  Negative for diaphoresis or SOB.  He has a history of strokes and has left sided deficits which are chronic.  He does not notice any worsening.  He tried NTG at home with some improvement, but the pain returned.  In the ED, he had a NTG which did not help, but he did feel that morphine helped somewhat.  The pain is not reproducible.  He is a former smoker, quit in the 80s.  He had a Myoview in 2021 which was a low risk study with no evidence for ischemia.  He has had elevated blood pressures today while in the ED.  Based on review of chart, bp has been well controlled up until visit last November when his BP was similar to now.  His wife reports no recent changes to his BP medications.  He is taking amlodipine, lisinopril, metoprolol daily and took them this morning as well.  Also with FH of heart disease and stroke.  He denies headache, neck pain, fever, chills, abdominal pain, diarrhea, constipation.  His wife is worried that she would not be able to care for him in this state at home.   ED Course: In the ED, he was worked up for ACS.  He was noted to have troponin < 10 X 2.  His BP remained high and chest pressure recurred after morphine.  K was low and was replaced.  ALP is noted to be 180.  TnI were 8 and 4 respectively.  Patient was noted to have elevated BP to the 170s/90s - 180s//90s.  Reported having taken AM medications.  Patient was stopped off of eliquis and placed on heparin ggt and nitro drip for  dual purpose of treating chest pain and elevated BP.  CT angio of Chest/abd/pelvis done showing no aortic aneurysm or dissection.  However, did find narrowing of celiac artery (asymptomatic, EDP spoke with vascular, outpatient follow up), hepatic hyperenhancing foci (recommend non urgent outpatient hepatic MRI for follow up), inflammatory nodules in the left lower lobe of the lung (CT follow up in 3 months recommended), and distended urinary bladder.  Post void residual > 200cc.   EKG showed RBBB which appeared similar to previous.   Review of Systems: As per HPI otherwise all other systems reviewed and are negative.   Past Medical History:  Diagnosis Date   Carotid arterial disease ([place])    a. 08/2018 Carotid U/S: min-mod RICA atherosclerosis w/o hemodynamically significant stenosis. Nl LICA.   Diabetes 1.5, managed as type 2 ([place])    Diastolic dysfunction    a. 08/2018 Echo: EF 65%. No rwma. Gr1 DD. Mild MR.   Diastolic dysfunction    a. 08/2019 Echo: EF 55-60%, Gr1 DD. No rwma. Mild MR. RVSP 37.9mmHg.   Hypercholesterolemia    Hypertension    PAF (paroxysmal atrial fibrillation) ([place])    a. 10/2019 Event Monitor: PAF; b. CHA2DS2VASc = 5-->Eliquis.   Poorly controlled diabetes mellitus ([place])    a. 04/2019 A1c 13.8.   Recurrent  strokes ([place])    a. 10/2016 MRI/A: Acute 15mm R thalamic infarct, ? subacute infarct of R corona radiata; b. 08/2017 MRI/A: Acute 54mm lateral L thalamic infarct. Other more remote lacunar infarcts of thalami bilat. Small vessel dzs; c. 08/2018 MRI/A: Acute lacunar infarct of the post limb of R internal capsule; d. 08/2019 MRI Acute CVA of L paramedian pons adn R cerebellar hemisphere.   Tobacco abuse     Past Surgical History:  Procedure Laterality Date   ESOPHAGOGASTRODUODENOSCOPY (EGD) WITH PROPOFOL N/A 04/26/2019   Procedure: ESOPHAGOGASTRODUODENOSCOPY (EGD) WITH PROPOFOL;  Surgeon: Lin Landsman, MD;  Location: [facility] ENDOSCOPY;  Service: Gastroenterology;   Laterality: N/A;    Social History  reports that he has quit smoking. His smoking use included cigarettes. He started smoking about 41 years ago. He has a 38.00 pack-year smoking history. He has never used smokeless tobacco. He reports that he does not currently use alcohol after a past usage of about 1.0 standard drink per week. He reports that he does not currently use drugs after having used the following drugs: Cocaine and Marijuana.  No Known Allergies  Family History  Problem Relation Age of Onset   Hypertension Mother    Stroke Mother        died @ age 57   Hypertension Father    Diabetes Father    Heart attack Father        died @ 72    Prior to Admission medications   Medication Sig Start Date End Date Taking? Authorizing Provider  ACCU-CHEK GUIDE test strip Check fsbs once daily 12/24/20  Yes Sowles, Drue Stager, MD  Accu-Chek Softclix Lancets lancets SMARTSIG:Topical 12/24/20  Yes Steele Sizer, MD  albuterol (VENTOLIN HFA) 108 (90 Base) MCG/ACT inhaler Inhale 2 puffs into the lungs every 6 (six) hours as needed for wheezing or shortness of breath. 02/26/21  Yes Vigg, Avanti, MD  amLODipine (NORVASC) 10 MG tablet Take 1 tablet (10 mg total) by mouth daily. 06/27/21 09/25/21 Yes Vigg, Avanti, MD  apixaban (ELIQUIS) 5 MG TABS tablet Take 1 tablet (5 mg total) by mouth 2 (two) times daily. 03/20/21  Yes Vigg, Avanti, MD  atorvastatin (LIPITOR) 80 MG tablet Take 1 tablet (80 mg total) by mouth daily. 03/20/21  Yes Vigg, Avanti, MD  baclofen (LIORESAL) 10 MG tablet Take 1 tablet (10 mg total) by mouth 3 (three) times daily. 06/20/21  Yes Vigg, Avanti, MD  Blood Glucose Monitoring Suppl (FIFTY50 GLUCOSE METER 2.0) w/Device KIT Use as instructed 05/05/18  Yes [provider]  Blood Glucose Monitoring Suppl KIT Accucheck Guide with lancets and test strips #100 with one refill.  Test blood sugar twice daily. DX Code E11.65 ; Z79.4 07/09/20  Yes Towanda Malkin, MD  Continuous  Blood Gluc Sensor (FREESTYLE LIBRE 2 SENSOR) MISC 1 each by Does not apply route every 14 (fourteen) days. 06/20/21  Yes Vigg, Avanti, MD  dapagliflozin propanediol (FARXIGA) 10 MG TABS tablet Take 1 tablet (10 mg total) by mouth daily before breakfast. In place of glipizide 03/20/21  Yes Vigg, Avanti, MD  fexofenadine (ALLEGRA ALLERGY) 180 MG tablet Take 1 tablet (180 mg total) by mouth daily. 07/15/21  Yes Vigg, Avanti, MD  Incontinence Supply Disposable (COMFORT PROTECT ADULT DIAPER/L) MISC 1 Units by Does not apply route daily. Use 3-4 a day as needed 06/20/21  Yes Vigg, Avanti, MD  Insulin Pen Needle 32G X 6 MM MISC Daily 10/24/20  Yes Sowles, Drue Stager, MD  ketoconazole (NIZORAL) 2 %  cream Apply 1 application topically daily. 07/12/21  Yes McDonald, Adam R, DPM  lisinopril (ZESTRIL) 40 MG tablet Take 1 tablet (40 mg total) by mouth daily. 08/15/20  Yes End, Harrell Gave, MD  melatonin 1 MG TABS tablet TAKE 1 TABLET BY MOUTH AT BEDTIME 07/15/21  Yes Vigg, Avanti, MD  metFORMIN (GLUCOPHAGE) 500 MG tablet Take 1 tablet (500 mg total) by mouth 2 (two) times daily with a meal. 07/15/21  Yes Vigg, Avanti, MD  metoCLOPramide (REGLAN) 5 MG tablet TAKE 1 TABLET BY MOUTH 3 TIMES DAILY BEFORE MEALS 08/05/21  Yes McElwee, Lauren A, NP  metoprolol succinate (TOPROL-XL) 25 MG 24 hr tablet Take 1 tablet (25 mg total) by mouth daily. 06/20/21  Yes Vigg, Avanti, MD  Misc. Devices MISC One pair of Compression stockings  Hemiplegia and hemiparesis following cerebral infarction affecting left non-dominant side ([place])  - Primary Codes: I78.676 Lower extremity edema  Codes: R60.0 Type 2 diabetes mellitus with hyperglycemia, with long-term current use of insulin ([place])  Codes: E11.65, Z79.4 06-12-2020  Yes Lebron Conners D, MD  omeprazole (PRILOSEC) 40 MG capsule TAKE 1 CAPSULE BY MOUTH IN THE MORNING AND AT BEDTIME 08/05/21  Yes Vigg, Avanti, MD  SENNA PLUS 8.6-50 MG tablet TAKE 2 TABLETS BY MOUTH 2 TIMES DAILY  08/06/21  Yes Cannady, Jolene T, NP  sitaGLIPtin (JANUVIA) 100 MG tablet Take 1 tablet (100 mg total) by mouth daily. 06/20/21  Yes Vigg, Loman Brooklyn, MD    Physical Exam: Vitals:   09/14/21 1330 09/14/21 1400 09/14/21 1430 09/14/21 1500  BP: (!) 178/92 (!) 183/87 (!) 170/89 (!) 179/92  Pulse: 64 63 64 64  Resp:  16  10  Temp:      SpO2: 100% 99% 100% 98%  Weight:      Height:        Constitutional: NAD, calm, comfortable, lying in bed Eyes: pin point, lids and conjunctivae normal. Mild discharge noted on right eyelash ENMT: Mucous membranes are moist.  Neck: normal, supple Respiratory: Breathing comfortably, clear to ausculation, no wheezing Cardiovascular: RR, NR, no murmur noted.  No peripheral edema, good pulses in DP and radial arteries, no reproducible chest pain over left chest wall.  Abdomen: + BS, NT, ND Musculoskeletal: He has mildly contracted left upper extremity.  Otherwise, normal tone and bulk.  Skin: no rashes, lesions, ulcers on exposed skin Neurologic: He has mildly delayed and slurred speech.  EOMI.  He has a leftward deviation to the tongue.  He is 3+/5 in grip, biceps strength on the left upper extremity, 5/5 on the right.  He has 4/5 strength in dorsiflexion, plantarflexion and adduction on the left leg and 5/5 for all on the right.  This appears to be at baseline for him.  Psychiatric: Alert and oriented x 3. Normal mood.    Labs on Admission: I have personally reviewed following labs and imaging studies  CBC: Recent Labs  Lab 09/14/21 1036  WBC 6.7  HGB 12.3*  HCT 38.0*  MCV 88.0  PLT 720    Basic Metabolic Panel: Recent Labs  Lab 09/14/21 1036  NA 141  K 3.3*  CL 105  CO2 24  GLUCOSE 227*  BUN 18  CREATININE 1.21  CALCIUM 9.6    GFR: Estimated Creatinine Clearance: 55.7 mL/min (by C-G formula based on SCr of 1.21 mg/dL).  Liver Function Tests: Recent Labs  Lab 09/14/21 1036  AST 33  ALT 56*  ALKPHOS 180*  BILITOT 0.6  PROT 7.9   ALBUMIN  3.9    Urine analysis:    Component Value Date/Time   COLORURINE YELLOW 05/16/2021 1101   APPEARANCEUR CLEAR 05/16/2021 1101   APPEARANCEUR Clear 02/26/2021 1124   LABSPEC 1.020 05/16/2021 1101   PHURINE 5.5 05/16/2021 1101   GLUCOSEU >1,000 (A) 05/16/2021 1101   HGBUR SMALL (A) 05/16/2021 1101   BILIRUBINUR NEGATIVE 05/16/2021 1101   BILIRUBINUR Negative 02/26/2021 1124   KETONESUR NEGATIVE 05/16/2021 1101   PROTEINUR >300 (A) 05/16/2021 1101   NITRITE NEGATIVE 05/16/2021 1101   LEUKOCYTESUR NEGATIVE 05/16/2021 1101    Radiological Exams on Admission: DG Chest 2 View  Result Date: 09/14/2021 CLINICAL DATA:  66 year old male with history of chest pain. EXAM: CHEST - 2 VIEW COMPARISON:  Chest x-ray 09/20/2020. FINDINGS: Lung volumes are normal. No consolidative airspace disease. No pleural effusions. No pneumothorax. No pulmonary nodule or mass noted. Pulmonary vasculature and the cardiomediastinal silhouette are within normal limits. IMPRESSION: No radiographic evidence of acute cardiopulmonary disease. Electronically Signed   By: Vinnie Langton M.D.   On: 09/14/2021 12:02   CT Angio Chest/Abd/Pel for Dissection W and/or Wo Contrast  Result Date: 09/14/2021 CLINICAL DATA:  Acute aortic syndrome suspected. EXAM: CT ANGIOGRAPHY CHEST, ABDOMEN AND PELVIS TECHNIQUE: Non-contrast CT of the chest was initially obtained. Multidetector CT imaging through the chest, abdomen and pelvis was performed using the standard protocol during bolus administration of intravenous contrast. Multiplanar reconstructed images and MIPs were obtained and reviewed to evaluate the vascular anatomy. RADIATION DOSE REDUCTION: This exam was performed according to the departmental dose-optimization program which includes automated exposure control, adjustment of the mA and/or kV according to patient size and/or use of iterative reconstruction technique. CONTRAST:  155mL OMNIPAQUE IOHEXOL 350 MG/ML SOLN  COMPARISON:  May 16, 2021. FINDINGS: CTA CHEST FINDINGS Cardiovascular: Non contrasted sequence demonstrates aortic atherosclerosis and coronary artery calcifications without intramural hematoma. No evidence of thoracic aortic aneurysm or dissection. No central pulmonary embolus. Normal size heart. No abnormal intracardiac filling defect. No significant pericardial effusion/thickening. Mediastinum/Nodes: No discrete thyroid nodule. No pathologically enlarged mediastinal, hilar or axillary lymph nodes. The trachea and esophagus are unremarkable. Lungs/Pleura: Cluster of nodularity in the posterior left lower lobe for instance on image 69/6 and in the lateral left lower lobe on image 92/6 are favored infectious or inflammatory, recommend follow-up dedicated chest CT in 3 months to ensure resolution. No pleural effusion. No pneumothorax. Musculoskeletal: No acute osseous abnormality. Review of the MIP images confirms the above findings. CTA ABDOMEN AND PELVIS FINDINGS VASCULAR Aorta: Scattered calcified noncalcified aortic plaque. Normal caliber aorta without aneurysm, dissection, vasculitis or significant stenosis. Celiac: There is narrowing of the celiac artery at the ostia with upwards hooked appearance of the celiac artery and post stenotic dilation. SMA: Patent without evidence of aneurysm, dissection, vasculitis or significant stenosis. Renals: Renal arteries are patent without evidence of aneurysm, dissection, vasculitis, fibromuscular dysplasia or significant stenosis. IMA: Patent without evidence of aneurysm, dissection, vasculitis or significant stenosis. Inflow: Patent without evidence of aneurysm, dissection, vasculitis or significant stenosis. Veins: No obvious venous abnormality within the limitations of this arterial phase study. Review of the MIP images confirms the above findings. NON-VASCULAR Hepatobiliary: Hyperenhancing hepatic foci including a segment VIII focus measuring 10 mm on image 64/5,  a segment II focus measuring 5 mm on image 64/5 and a anterior segment IVa focus measuring 4 mm on image 77/5. Gallbladder is unremarkable. No biliary ductal dilation. Pancreas: No pancreatic ductal dilation or evidence of acute inflammation. Spleen: Within normal limits. Adrenals/Urinary  Tract: Bilateral adrenal glands appear normal. No hydronephrosis. No solid enhancing renal mass. Urinary bladder is distended without abnormal wall thickening. Stomach/Bowel: No enteric contrast was administered. Stomach is unremarkable for degree of distension. No pathologic dilation of small or large bowel. The appendix and terminal ileum appear normal. No evidence of acute bowel inflammation. Lymphatic: No pathologically enlarged abdominal or pelvic lymph nodes. Reproductive: Prostatomegaly with median lobe hypertrophy. Other: No significant abdominopelvic free fluid. Musculoskeletal: L5-S1 discogenic disease. No acute osseous abnormality. Review of the MIP images confirms the above findings. IMPRESSION: 1. No evidence of thoracic or abdominal aortic aneurysm or dissection. 2. 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 3. Hyperenhancing hepatic foci measuring up to 10 mm, which may reflect flash filling hemangiomas or intrahepatic shunts but are incompletely evaluated. Recommend more definitive characterization with nonemergent hepatic protocol abdominal MRI with and without contrast preferably as an outpatient when patient's acute symptomatology has resolved and is able to follow commands including breath hold. 4. Cluster of nodularity in the posterior left lower lobe are favored infectious or inflammatory in etiology. Suggest follow-up dedicated chest CT in 3 months to ensure stability/resolution. 5. Prostatomegaly with median lobe hypertrophy. 6. Distended urinary bladder without abnormal wall  thickening, which may reflect outflow impedance. 7.  Aortic Atherosclerosis (ICD10-I70.0). Electronically Signed   By: Dahlia Bailiff M.D.   On: 09/14/2021 13:02    EKG: Independently reviewed. RBBB, similar to previous.   Assessment/Plan  Accelerated Hypertension without congestive heart failure Essential hypertension RBBB Left Sided chest pain, possible Unstable angina - Given the troponin, and lack of EKG changes, I believe this is likely accelerated hypertension leading to chest pain.  Will treat with NTG drip and transition from eliquis to heparin drip - Trend Troponin X 2 further to see if developing heart attack --> if normal, stop heparin and transition back to eliquis - Telemetry - EKG in the AM - Trend blood pressure - He has been previously controlled on home medications, amlodipine, lisinopril and metoprolol.  These were restarted.  Uptitrate and/or add new medication based on further work up.   Update: Low BP with low dose nitro ggt - stopped and blood pressure improved to 852D systolic.  He did have one episode of emesis with chest pain after which chest pain resolved.   Update:3rd TnI remains < 10.  Will stop heparin, restart eliquis.  Needs treatment for elevated BP and accelerated HTN.  Can likely transition out of SDU to telemetry in the AM.   History of Recurrent strokes Hemiplegia and hemiparesis following cerebral infarction affecting left non-dominant side - Neurologic symptoms are stable - See above - Telemetry and monitoring    Diabetes mellitus - Last A1C in November 7.5 - Continue farxiga - SSI  Hyperlipidemia LDL goal <70 - Last LDL 62 - continue atorvastatin    Atrial fibrillation - Hold eliquis as noted above - metoprolol continued - Telemetry - AM EKG  CKD (chronic kidney disease) stage 2, GFR 60-89 ml/min - At baseline, monitor  Hypokalemia - Replaced orally, recheck in the AM  Elevated ALP - Will need outpatient follow up  Normocytic  anemia, mild - Trend, chronic, appears stable and at baseline  Narrowing of celiac artery seen on CTA of the abdomen - EDP discussed with vascular surgery - OP follow up  Hepatic hyperenhancing foci seen on CTA of the abdomen - Radiology  recommended non urgent outpatient hepatic MRI for follow up  Inflammatory nodules in the left lower lobe of the lung seen on CT imaging - CT follow up in 3 months recommended by radiology   Distended urinary bladder seen on imaging, asymptomatic - Post void residual > 200cc in the ED - Patient would prefer to avoid foley catheter - Bladder scan TID, post void residual, if remains elevated, will need foley catheter for decompression - I/Os.   DVT prophylaxis: Heparin ggt  Code Status:   Full  Family Communication:  Wife at bedside Disposition Plan:   Patient is from:  Home  Anticipated DC to:  TBD  Anticipated DC date:  09/17/21  Anticipated DC barriers: Ability to ambulate, need for further services  Consults called:  None  Admission status:  Inp, Stepdown   Severity of Illness: The appropriate patient status for this patient is INPATIENT. Inpatient status is judged to be reasonable and necessary in order to provide the required intensity of service to ensure the patient's safety. The patient's presenting symptoms, physical exam findings, and initial radiographic and laboratory data in the context of their chronic comorbidities is felt to place them at high risk for further clinical deterioration. Furthermore, it is not anticipated that the patient will be medically stable for discharge from the hospital within 2 midnights of admission.   * I certify that at the point of admission it is my clinical judgment that the patient will require inpatient hospital care spanning beyond 2 midnights from the point of admission due to high intensity of service, high risk for further deterioration and high frequency of surveillance required.Gilles Chiquito  MD [facility]  How to contact the [facility] Attending or Consulting provider [place] or covering provider during after hours [place], for this patient?   Check the care team in [facility] and look for a) attending/consulting [facility] provider listed and b) the [facility] team listed Log into [email or web address] and use [place]'s universal password to access. If you do not have the password, please contact the hospital operator. Locate the [facility] provider you are looking for under [facility] and page to a number that you can be directly reached. If you still have difficulty reaching the provider, please page the [facility] (Director on Call) for the Hospitalists listed on amion for assistance.  09/14/2021, 3:52 PM

## 2021-09-14 NOTE — Plan of Care (Signed)

## 2021-09-14 NOTE — Consult Note (Signed)
ANTICOAGULATION CONSULT NOTE  Pharmacy Consult for IV Heparin Indication: chest pain/ACS  Patient Measurements: Height: 5\' 6"  (167.6 cm) Weight: 68 kg (150 lb) IBW/kg (Calculated) : 63.8 Heparin Dosing Weight: 68 kg  Labs: Recent Labs    09/14/21 1036 09/14/21 1259  HGB 12.3*  --   HCT 38.0*  --   PLT 284  --   CREATININE 1.21  --   TROPONINIHS 8 4    Estimated Creatinine Clearance: 55.7 mL/min (by C-G formula based on SCr of 1.21 mg/dL).   Medical History: Past Medical History:  Diagnosis Date   Carotid arterial disease ([facility])    a. 08/2018 Carotid U/S: min-mod RICA atherosclerosis w/o hemodynamically significant stenosis. Nl LICA.   Diabetes 1.5, managed as type 2 ([facility])    Diastolic dysfunction    a. 08/2018 Echo: EF 65%. No rwma. Gr1 DD. Mild MR.   Diastolic dysfunction    a. 08/2019 Echo: EF 55-60%, Gr1 DD. No rwma. Mild MR. RVSP 37.27mmHg.   Hypercholesterolemia    Hypertension    PAF (paroxysmal atrial fibrillation) (H[facility])    a. 10/2019 Event Monitor: PAF; b. CHA2DS2VASc = 5-->Eliquis.   Poorly controlled diabetes mellitus ([facility])    a. 04/2019 A1c 13.8.   Recurrent strokes ([facility])    a. 10/2016 MRI/A: Acute 87mm R thalamic infarct, ? subacute infarct of R corona radiata; b. 08/2017 MRI/A: Acute 45mm lateral L thalamic infarct. Other more remote lacunar infarcts of thalami bilat. Small vessel dzs; c. 08/2018 MRI/A: Acute lacunar infarct of the post limb of R internal capsule; d. 08/2019 MRI Acute CVA of L paramedian pons adn R cerebellar hemisphere.   Tobacco abuse     Medications:  Apixaban 5 mg BID prior to admission (last patient reported dose was 1/29 at 0800)  Assessment: Patient is a 66 y/o M with medical history including Afib on apixaban, recurrent CVAs, HTN, diastolic CHF, diabetes, carotid arterial disease, tobacco use who presented to the ED 1/29 with chest pain. Troponin within normal limits. Pharmacy consulted to initiate heparin infusion for suspected ACS.    Baseline CBC acceptable. Baseline aPTT and PT-INR are pending.  Goal of Therapy:  Heparin level 0.3-0.7 units/ml aPTT 66 - 102 seconds Monitor platelets by anticoagulation protocol: Yes   Plan:  --Heparin 4000 unit IV bolus followed by continuous infusion at 800 units/hr --Will check aPTT 6 hours after initiation of infusion. Un-able to follow heparin level at this time given interference by apixaban --Daily CBC per protocol while on IV heparin  2/29 09/14/2021,1:57 PM

## 2021-09-14 NOTE — ED Triage Notes (Signed)
Pt in via EMS from home with c/o CP EMS reports pain started at 0100, 8/10 pain. EMS gave pt 324mg  of asa and 2 spray of nitro, pain decreased. 180/110, Hx of HTN, HR 84, 227CBG, #18g to right forearm. Pt with hx of stroke with left sided deficit.

## 2021-09-14 NOTE — ED Triage Notes (Signed)
Pt to ED via [facility] from home c/o chest pain that started this morning around 0100. Pt denies radiation of the pain. Pt states that he did vomit x 1 this morning. Pt has hx/o HTN and did take this medications this morning. Pt give Nitro spray x 2 and ASA in route by ems.

## 2021-09-14 NOTE — Discharge Instructions (Signed)
IMPRESSION: 1. No evidence of thoracic or abdominal aortic aneurysm or dissection. 2. 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 3. Hyperenhancing hepatic foci measuring up to 10 mm, which may reflect flash filling hemangiomas or intrahepatic shunts but are incompletely evaluated. Recommend more definitive characterization with nonemergent hepatic protocol abdominal MRI with and without contrast preferably as an outpatient when patient's acute symptomatology has resolved and is able to follow commands including breath hold. 4. Cluster of nodularity in the posterior left lower lobe are favored infectious or inflammatory in etiology. Suggest follow-up dedicated chest CT in 3 months to ensure stability/resolution. 5. Prostatomegaly with median lobe hypertrophy. 6. Distended urinary bladder without abnormal wall thickening, which may reflect outflow impedance. 7.  Aortic Atherosclerosis (ICD10-I70.0).

## 2021-09-14 NOTE — ED Provider Notes (Addendum)
[facility] Provider Note    Event Date/Time   First MD Initiated Contact with Patient 09/14/21 1119     (approximate)   History   Chest Pain   HPI  Evan KUIKEN Sr. is a 66 y.o. male with diabetes, hypertension, hyperlipidemia, on Eliquis, prior stroke with left-sided deficits, paroxysmal A. fib who comes in with chest pain.  Patient reports chest pain that started around 1 AM from rest.  He reports it is a pressure sensation, 8 out of 10.  Denies it radiating.  He reports that he did have an episode of vomiting with it as well.  He reports the nitro spray started to help the pain but then the pain came back.  He reports being compliant with his Eliquis.  No new shortness of breath.  No abdominal pain.  He has baseline left-sided deficits but denies any new numbness, weakness.   Physical Exam   Triage Vital Signs: ED Triage Vitals  Enc Vitals Group     BP 09/14/21 1100 (!) 180/11     Pulse Rate 09/14/21 1100 73     Resp 09/14/21 1100 16     Temp 09/14/21 1100 (!) 97.5 F (36.4 C)     Temp src --      SpO2 09/14/21 1100 98 %     Weight 09/14/21 1104 150 lb (68 kg)     Height 09/14/21 1104 5\' 6"  (1.676 m)     Head Circumference --      Peak Flow --      Pain Score 09/14/21 1104 8     Pain Loc --      Pain Edu? --      Excl. in GC? --     Most recent vital signs: Vitals:   09/14/21 1100  BP: (!) 180/11  Pulse: 73  Resp: 16  Temp: (!) 97.5 F (36.4 C)  SpO2: 98%     General: Awake, no distress.  CV:  Good peripheral perfusion.  Radial and pedal pulses are equal Resp:  Normal effort.  Abd:  No distention.  Nontender    ED Results / Procedures / Treatments   Labs (all labs ordered are listed, but only abnormal results are displayed) Labs Reviewed  CBC - Abnormal; Notable for the following components:      Result Value   Hemoglobin 12.3 (*)    HCT 38.0 (*)    RDW 15.9 (*)    All other components within normal limits  BASIC  METABOLIC PANEL  TROPONIN I (HIGH SENSITIVITY)     EKG  My interpretation of EKG:  Normal sinus rate of 74 without any ST elevation but does have some T wave inversion in aVL V2 through V4 with a right bundle branch block.  Reviewed prior EKG and looks somewhat similar   RADIOLOGY I have reviewed the xray personally and agree with radiology read and this was read as negative  CT personally reviewed and pending radiology read   PROCEDURES:  Critical Care performed: No  .1-3 Lead EKG Interpretation Performed by: 09/16/21, MD Authorized by: Concha Se, MD     Interpretation: normal     ECG rate:  70   ECG rate assessment: normal     Rhythm: sinus rhythm     Ectopy: none     Conduction: normal   .Critical Care Performed by: Concha Se, MD Authorized by: Concha Se, MD   Critical care provider  statement:    Critical care time (minutes):  30   Critical care was necessary to treat or prevent imminent or life-threatening deterioration of the following conditions:  Cardiac failure   Critical care was time spent personally by me on the following activities:  Development of treatment plan with patient or surrogate, discussions with consultants, evaluation of patient's response to treatment, examination of patient, ordering and review of laboratory studies, ordering and review of radiographic studies, ordering and performing treatments and interventions, pulse oximetry, re-evaluation of patient's condition and review of old charts   MEDICATIONS ORDERED IN ED: Medications  morphine 4 MG/ML injection 4 mg (has no administration in time range)  ondansetron (ZOFRAN) injection 4 mg (has no administration in time range)  nitroGLYCERIN (NITROSTAT) SL tablet 0.4 mg (has no administration in time range)     IMPRESSION / MDM / ASSESSMENT AND PLAN / ED COURSE  I reviewed the triage vital signs and the nursing notes.  66 year old with multiple comorbidities who comes in  with severe chest pain. Differential diagnosis includes, but is not limited to, ACS.  Unlikely PE given on Eliquis.  Considered dissection but no pain radiating to the back although patient is pretty hypertensive.  Will get EKG, cardiac markers, keep patient the cardiac monitor.  We will give some IV morphine and sublingual nitro to help with symptoms.  12:02 PM reevaluate patient, troponin normal.  Patient remains significantly hypertensive.  Will get CT imaging to further evaluate for dissection.  Denies any headaches or hitting his head recently to need a CT head  Troponins are negative x2 CBC shows hemoglobin at baseline.  No white count elevation BMP shows slightly low potassium will give some oral repletion LFTs are slightly elevated but has had this previously  CT imaging was negative for dissection however he did have some concern for potential medial arcuate ligament syndrome.  There is also some incidental findings of liver foci they recommended outpatient MRI but does not where he is tender.  Also needs outpatient CT for lung.  I discussed all these incidental findings on the CT with family and had a week and followed up in MyChart to follow-up with her primary care doctor they expressed understanding.  I do not think the medial arcuate ligament syndrome is causing his symptoms today.  Denies any discomfort with eating his pain is not epigastric is on the left side by his heart.  I discussed with Dr. Evie LacksEsco from vascular and he can follow-up outpatient for this.  I discussed discharge given patient's cardiac markers were negative but patient still reporting pain.  Family did not feel comfortable with patient going home and following outpatient with cardiology.  Given his continued pain this is could be concerning for unstable angina given his risk factors..  He denies ever having this pain previously is not musculoskeletal in nature and he is got no rash on examination.  We will start  patient on heparin for possible unstable angina as well as nitro drip for the pain to see if that helps.  Maybe it could be from his blood pressure as well.  The patient is on the cardiac monitor to evaluate for evidence of arrhythmia and/or significant heart rate changes.  FINAL CLINICAL IMPRESSION(S) / ED DIAGNOSES   Final diagnoses:  Unstable angina (HCC)  Hypertension, unspecified type     Rx / DC Orders   ED Discharge Orders     None        Note:  This  document was prepared using Conservation officer, historic buildings and may include unintentional dictation errors.   Concha Se, MD 09/14/21 1350    Concha Se, MD 09/14/21 1351

## 2021-09-14 NOTE — ED Notes (Signed)
NTG stopped due to pt becoming hypotensive and MD made aware.

## 2021-09-14 NOTE — Consult Note (Addendum)
ANTICOAGULATION CONSULT NOTE  Pharmacy Consult for Eliquis Indication: afib  Patient Measurements: Height: 5\' 6"  (167.6 cm) Weight: 65.8 kg (145 lb 1 oz) IBW/kg (Calculated) : 63.8 Heparin Dosing Weight: 68 kg  Labs: Recent Labs    09/14/21 1036 09/14/21 1259 09/14/21 1448 09/14/21 1813  HGB 12.3*  --   --   --   HCT 38.0*  --   --   --   PLT 284  --   --   --   APTT  --   --  31  --   LABPROT  --   --  13.4  --   INR  --   --  1.0  --   CREATININE 1.21  --   --   --   TROPONINIHS 8 4  --  8     Estimated Creatinine Clearance: 55.7 mL/min (by C-G formula based on SCr of 1.21 mg/dL).   Medical History: Past Medical History:  Diagnosis Date   Carotid arterial disease (HCC)    a. 08/2018 Carotid U/S: min-mod RICA atherosclerosis w/o hemodynamically significant stenosis. Nl LICA.   Diabetes 1.5, managed as type 2 (HCC)    Diastolic dysfunction    a. 08/2018 Echo: EF 65%. No rwma. Gr1 DD. Mild MR.   Diastolic dysfunction    a. 08/2019 Echo: EF 55-60%, Gr1 DD. No rwma. Mild MR. RVSP 37.65mmHg.   Hypercholesterolemia    Hypertension    PAF (paroxysmal atrial fibrillation) (HCC)    a. 10/2019 Event Monitor: PAF; b. CHA2DS2VASc = 5-->Eliquis.   Poorly controlled diabetes mellitus (HCC)    a. 04/2019 A1c 13.8.   Recurrent strokes (HCC)    a. 10/2016 MRI/A: Acute 32mm R thalamic infarct, ? subacute infarct of R corona radiata; b. 08/2017 MRI/A: Acute 29mm lateral L thalamic infarct. Other more remote lacunar infarcts of thalami bilat. Small vessel dzs; c. 08/2018 MRI/A: Acute lacunar infarct of the post limb of R internal capsule; d. 08/2019 MRI Acute CVA of L paramedian pons adn R cerebellar hemisphere.   Tobacco abuse     Medications:  Apixaban 5 mg BID prior to admission (last patient reported dose was 1/29 at 0800)  Assessment: Patient is a 66 y/o M with medical history including Afib on apixaban, recurrent CVAs, HTN, diastolic CHF, diabetes, carotid arterial disease, tobacco  use who presented to the ED 1/29 with chest pain. Troponin within normal limits. Pharmacy consulted to restart apixaban.    Goal of Therapy:  Monitor platelets by anticoagulation protocol: Yes   Plan:  Discontinue heparin infusion and restart patient home dose of Apixaban 5 mg BID CBC per protocol  Kayn Haymore O Tovia Kisner 09/14/2021,8:03 PM

## 2021-09-15 ENCOUNTER — Telehealth: Payer: Self-pay | Admitting: Internal Medicine

## 2021-09-15 ENCOUNTER — Other Ambulatory Visit: Payer: Medicaid Other

## 2021-09-15 DIAGNOSIS — I161 Hypertensive emergency: Principal | ICD-10-CM

## 2021-09-15 LAB — CBC
HCT: 35.2 % — ABNORMAL LOW (ref 39.0–52.0)
Hemoglobin: 11.5 g/dL — ABNORMAL LOW (ref 13.0–17.0)
MCH: 27.8 pg (ref 26.0–34.0)
MCHC: 32.7 g/dL (ref 30.0–36.0)
MCV: 85.2 fL (ref 80.0–100.0)
Platelets: 231 10*3/uL (ref 150–400)
RBC: 4.13 MIL/uL — ABNORMAL LOW (ref 4.22–5.81)
RDW: 16.1 % — ABNORMAL HIGH (ref 11.5–15.5)
WBC: 5.9 10*3/uL (ref 4.0–10.5)
nRBC: 0 % (ref 0.0–0.2)

## 2021-09-15 LAB — GLUCOSE, CAPILLARY
Glucose-Capillary: 168 mg/dL — ABNORMAL HIGH (ref 70–99)
Glucose-Capillary: 178 mg/dL — ABNORMAL HIGH (ref 70–99)
Glucose-Capillary: 109 mg/dL — ABNORMAL HIGH (ref 70–99)
Glucose-Capillary: 176 mg/dL — ABNORMAL HIGH (ref 70–99)

## 2021-09-15 LAB — BASIC METABOLIC PANEL
Anion gap: 7 (ref 5–15)
BUN: 17 mg/dL (ref 8–23)
CO2: 27 mmol/L (ref 22–32)
Calcium: 9.3 mg/dL (ref 8.9–10.3)
Chloride: 106 mmol/L (ref 98–111)
Creatinine, Ser: 1.11 mg/dL (ref 0.61–1.24)
GFR, Estimated: >60 mL/min (ref >60)
Glucose, Bld: 162 mg/dL — ABNORMAL HIGH (ref 70–99)
Potassium: 3.6 mmol/L (ref 3.5–5.1)
Sodium: 140 mmol/L (ref 135–145)

## 2021-09-15 MED ORDER — HYDRALAZINE HCL 20 MG/ML IJ SOLN
5.0000 mg | Freq: Once | INTRAMUSCULAR | Status: AC
  Administered 2021-09-15: 5 mg via INTRAVENOUS
  Filled 2021-09-15: qty 1

## 2021-09-15 MED ORDER — METOPROLOL SUCCINATE ER 50 MG PO TB24
50.0000 mg | ORAL_TABLET | Freq: Every day | ORAL | Status: DC
  Administered 2021-09-15 – 2021-09-16 (×2): 50 mg via ORAL
  Filled 2021-09-15 (×2): qty 1

## 2021-09-15 MED ORDER — IRBESARTAN 150 MG PO TABS
300.0000 mg | ORAL_TABLET | Freq: Every day | ORAL | Status: DC
  Administered 2021-09-15 – 2021-09-16 (×2): 300 mg via ORAL
  Filled 2021-09-15 (×2): qty 2

## 2021-09-15 MED ORDER — LISINOPRIL 20 MG PO TABS
40.0000 mg | ORAL_TABLET | Freq: Every day | ORAL | Status: DC
  Administered 2021-09-15: 40 mg via ORAL
  Filled 2021-09-15: qty 2

## 2021-09-15 MED ORDER — ORAL CARE MOUTH RINSE
15.0000 mL | Freq: Two times a day (BID) | OROMUCOSAL | Status: DC
  Administered 2021-09-15 – 2021-09-16 (×3): 15 mL via OROMUCOSAL

## 2021-09-15 MED ORDER — TAMSULOSIN HCL 0.4 MG PO CAPS
0.8000 mg | ORAL_CAPSULE | Freq: Every day | ORAL | Status: DC
  Administered 2021-09-15 – 2021-09-16 (×2): 0.8 mg via ORAL
  Filled 2021-09-15 (×2): qty 2

## 2021-09-15 NOTE — Telephone Encounter (Signed)
Tried calling the patient's wife  several times, unable to leave vm.

## 2021-09-15 NOTE — Telephone Encounter (Signed)
Copied from CRM [id number]. Topic: General - Other >> Sep 15, 2021  8:44 AM Gaetana Michaelis A wrote: Reason for CRM: The patient's wife has requested to be contacted by a member of clinical staff when possible  The patient has been recently hospitalized and they would like to discuss the condition of the patient   Please contact further

## 2021-09-15 NOTE — Progress Notes (Signed)
PROGRESS NOTE  Patient: Evan WOJCIAK Sr. 66 y.o. male MRN: [id number]  Today 09/15/21 is hospital day 1  admission on 09/14/2021 11:19 AM  RECORD [place] COURSE: Evan Townsend is a 66 year old male with past medical history hypertension, recurrent strokes, carotid stenosis, 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  Presented to ED yesterday, 09/14/2021, with left-sided chest pain at rest, described as pressure.  No neurologic deficits (left-sided chronic deficits had not changed).  Home nitroglycerin unhelpful.  In ED, received nitroglycerin, morphine which was helpful.  Blood pressure well controlled for several years but looks like may be started becoming elevated November 2022.  Occasions include amlodipine, lisinopril, metoprolol and patient reports compliance with her medications.  Worked up for ACS in ED, troponin less than 10x2, no concerning EKG changes for ACS though did note right bundle branch block which appeared similar to previous EKGs. Blood pressure remained elevated. Eliquis discontinued and patient was placed on heparin drip and nitro drip.  CT angio negative for aortic aneurysm/dissection.  Admitted for ACS rule out, troponins and EKGs continue to be nonconcerning for ACS but blood pressure been slow to improve.  Patient is also had some urinary retention with postvoid residuals up to 500 cc.  Blood pressure dropped and nitroglycerin drip was discontinued, heparin drip was discontinued and patient was placed on home Eliquis.  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  Patient is resting comfortably.  He reports no complaints, "I feel 100%" has had no recurrence of chest pain, denies headache, vision change, nausea, diaphoresis, or any other concerns.     ASSESSMENT & PLAN  Accelerated Hypertension without congestive heart failure Essential hypertension RBBB Left Sided chest pain, possible Unstable angina - Given the troponin, and lack of EKG changes, most likely CP d/t accelerated HTN. Initial treatment w/ nitro gtt leading to hypotension, this was d/c. Initial treatment w/ heparin drip was transitioned to home Eliquis as EKG and Troponins reassuring for no ACS - Telemetry - He has been previously controlled on home medications, amlodipine, lisinopril and metoprolol.  These were restarted and BP  continued to be high. Lab reviewed this AM and renal fxn WNL, already had lisinopril but will change to ARB (irbesartan) considering possible ACE escape mechanism, will also increase metoprolol from 25 mg to 50 mg.  Tamsulosin as well since he has BPH, this may also give modest benefit blood pressure control   History of Recurrent strokes Hemiplegia and hemiparesis following cerebral infarction affecting left non-dominant side - Neurologic symptoms are stable - See above - Telemetry and monitoring    Diabetes mellitus - Last A1C in November 7.5 - Continue farxiga - SSI   Hyperlipidemia LDL goal <70 - Last LDL 62 - continue atorvastatin    Atrial fibrillation - restarted eliquis once off heparin - metoprolol continued but increased dose d/t hypertension - Telemetry - AM EKG pending   CKD (chronic kidney disease) stage 2, GFR 60-89 ml/min - At baseline, monitor   Hypokalemia - Replaced orally, WNL this AM   Elevated ALP - Will need outpatient follow up   Normocytic  anemia, mild - Trend, chronic, appears stable and at baseline   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    VTE Ppx: Eliquis (on this at home for Afib)  CODE STATUS   Code Status: Full Code  Admitted from: Home Expected Dispo: Home Barriers to discharge: Continued medical workup and treatment, goal is to get BP controlled  Family communication: patient requested I call his wife to update her, will speak w/ her later today        Objective: Vital signs in last 24 hours: Temp:  [96.5 F (35.8 C)-98 F (36.7 C)] 96.6 F (35.9 C) (01/30 0200) Pulse Rate:  [45-74] 66 (01/30 0700) Resp:  [9-20] 12 (01/30 0700) BP: (71-198)/(11-104) 162/88 (01/30 0700) SpO2:  [94 %-100 %] 100 % (01/30 0700) Weight:  [65.8 kg-68 kg] 65.8 kg (01/29 1639) Weight change:  Last BM Date: 09/13/21  Intake/Output from previous day: 01/29 0701 - 01/30 0700 In: 177.8 [P.O.:100; I.V.:77.8] Out: 1750 [Urine:1750] Intake/Output this shift: No intake/output data recorded.  General appearance: alert, appears stated age, and no distress Resp: clear to auscultation bilaterally Cardio: regular rate and rhythm, S1, S2 normal, no murmur, click, rub or gallop GI: soft, non-tender; bowel sounds normal; no masses,  no organomegaly Neurologic: L sided weakness from residual effects CVA  Lab Results: Recent Labs    09/14/21 1036 09/15/21 0305  WBC 6.7 5.9  HGB 12.3* 11.5*  HCT 38.0* 35.2*  PLT 284 231   BMET Recent Labs    09/14/21 1036  09/15/21 0305  NA 141 140  K 3.3* 3.6  CL 105 106  CO2 24 27  GLUCOSE 227* 162*  BUN 18 17  CREATININE 1.21 1.11  CALCIUM 9.6 9.3    Studies/Results: DG Chest 2 View  Result Date: 09/14/2021 CLINICAL DATA:  66 year old male with history of chest pain. EXAM: CHEST - 2 VIEW COMPARISON:  Chest x-ray 09/20/2020. FINDINGS: Lung volumes are normal. No consolidative airspace disease. No pleural effusions. No pneumothorax. No pulmonary nodule or mass noted. Pulmonary vasculature and the cardiomediastinal silhouette are within normal limits. IMPRESSION: No radiographic evidence of acute cardiopulmonary disease. Electronically Signed   By: Vinnie Langton M.D.   On: 09/14/2021 12:02   CT Angio Chest/Abd/Pel for Dissection W and/or Wo Contrast  Result Date: 09/14/2021 CLINICAL DATA:  Acute aortic syndrome suspected. EXAM: CT ANGIOGRAPHY CHEST, ABDOMEN AND PELVIS TECHNIQUE: Non-contrast CT of the chest was initially obtained. Multidetector CT imaging through the chest, abdomen and pelvis was performed using the standard protocol during bolus administration of intravenous contrast. Multiplanar reconstructed images and MIPs were obtained and reviewed to evaluate the vascular anatomy. RADIATION DOSE REDUCTION: This exam was performed according to the departmental dose-optimization program which includes automated exposure control, adjustment of the mA and/or kV according to patient size and/or use of iterative reconstruction technique. CONTRAST:  11m OMNIPAQUE IOHEXOL 350  MG/ML SOLN COMPARISON:  May 16, 2021. FINDINGS: CTA CHEST FINDINGS Cardiovascular: Non contrasted sequence demonstrates aortic atherosclerosis and coronary artery calcifications without intramural hematoma. No evidence of thoracic aortic aneurysm or dissection. No central pulmonary embolus. Normal size heart. No abnormal intracardiac filling defect. No significant pericardial effusion/thickening. Mediastinum/Nodes: No discrete thyroid  nodule. No pathologically enlarged mediastinal, hilar or axillary lymph nodes. The trachea and esophagus are unremarkable. Lungs/Pleura: Cluster of nodularity in the posterior left lower lobe for instance on image 69/6 and in the lateral left lower lobe on image 92/6 are favored infectious or inflammatory, recommend follow-up dedicated chest CT in 3 months to ensure resolution. No pleural effusion. No pneumothorax. Musculoskeletal: No acute osseous abnormality. Review of the MIP images confirms the above findings. CTA ABDOMEN AND PELVIS FINDINGS VASCULAR Aorta: Scattered calcified noncalcified aortic plaque. Normal caliber aorta without aneurysm, dissection, vasculitis or significant stenosis. Celiac: There is narrowing of the celiac artery at the ostia with upwards hooked appearance of the celiac artery and post stenotic dilation. SMA: Patent without evidence of aneurysm, dissection, vasculitis or significant stenosis. Renals: Renal arteries are patent without evidence of aneurysm, dissection, vasculitis, fibromuscular dysplasia or significant stenosis. IMA: Patent without evidence of aneurysm, dissection, vasculitis or significant stenosis. Inflow: Patent without evidence of aneurysm, dissection, vasculitis or significant stenosis. Veins: No obvious venous abnormality within the limitations of this arterial phase study. Review of the MIP images confirms the above findings. NON-VASCULAR Hepatobiliary: Hyperenhancing hepatic foci including a segment VIII focus measuring 10 mm on image 64/5, a segment II focus measuring 5 mm on image 64/5 and a anterior segment IVa focus measuring 4 mm on image 77/5. Gallbladder is unremarkable. No biliary ductal dilation. Pancreas: No pancreatic ductal dilation or evidence of acute inflammation. Spleen: Within normal limits. Adrenals/Urinary Tract: Bilateral adrenal glands appear normal. No hydronephrosis. No solid enhancing renal mass. Urinary bladder is distended without abnormal  wall thickening. Stomach/Bowel: No enteric contrast was administered. Stomach is unremarkable for degree of distension. No pathologic dilation of small or large bowel. The appendix and terminal ileum appear normal. No evidence of acute bowel inflammation. Lymphatic: No pathologically enlarged abdominal or pelvic lymph nodes. Reproductive: Prostatomegaly with median lobe hypertrophy. Other: No significant abdominopelvic free fluid. Musculoskeletal: L5-S1 discogenic disease. No acute osseous abnormality. Review of the MIP images confirms the above findings. IMPRESSION: 1. No evidence of thoracic or abdominal aortic aneurysm or dissection. 2. 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 3. Hyperenhancing hepatic foci measuring up to 10 mm, which may reflect flash filling hemangiomas or intrahepatic shunts but are incompletely evaluated. Recommend more definitive characterization with nonemergent hepatic protocol abdominal MRI with and without contrast preferably as an outpatient when patient's acute symptomatology has resolved and is able to follow commands including breath hold. 4. Cluster of nodularity in the posterior left lower lobe are favored infectious or inflammatory in etiology. Suggest follow-up dedicated chest CT in 3 months to ensure stability/resolution. 5. Prostatomegaly with median lobe hypertrophy. 6. Distended urinary bladder without abnormal wall thickening, which may reflect outflow impedance. 7.  Aortic Atherosclerosis (ICD10-I70.0). Electronically Signed   By: Dahlia Bailiff M.D.   On: 09/14/2021 13:02    Medications: Prior to Admission:  Medications Prior to Admission  Medication Sig Dispense Refill Last Dose   ACCU-CHEK GUIDE test strip Check fsbs once daily 100 each 2 09/14/2021 at 0800   Accu-Chek Softclix Lancets  lancets SMARTSIG:Topical 100 each 1 09/14/2021 at  0800   albuterol (VENTOLIN HFA) 108 (90 Base) MCG/ACT inhaler Inhale 2 puffs into the lungs every 6 (six) hours as needed for wheezing or shortness of breath. 8 g 1 09/14/2021 at 0800   amLODipine (NORVASC) 10 MG tablet Take 1 tablet (10 mg total) by mouth daily. 90 tablet 1 09/14/2021 at 0800   apixaban (ELIQUIS) 5 MG TABS tablet Take 1 tablet (5 mg total) by mouth 2 (two) times daily. 60 tablet 1 09/14/2021 at 0800   atorvastatin (LIPITOR) 80 MG tablet Take 1 tablet (80 mg total) by mouth daily. 90 tablet 1 09/14/2021 at 0800   baclofen (LIORESAL) 10 MG tablet Take 1 tablet (10 mg total) by mouth 3 (three) times daily. 90 each 1 09/14/2021 at 0800   Blood Glucose Monitoring Suppl (FIFTY50 GLUCOSE METER 2.0) w/Device KIT Use as instructed   09/14/2021 at 0800   Blood Glucose Monitoring Suppl KIT Accucheck Guide with lancets and test strips #100 with one refill.  Test blood sugar twice daily. DX Code E11.65 ; Z79.4 1 kit 0 09/14/2021 at 0800   Continuous Blood Gluc Sensor (FREESTYLE LIBRE 2 SENSOR) MISC 1 each by Does not apply route every 14 (fourteen) days. 6 each 1 09/14/2021 at 0800   dapagliflozin propanediol (FARXIGA) 10 MG TABS tablet Take 1 tablet (10 mg total) by mouth daily before breakfast. In place of glipizide 90 tablet 1 09/14/2021 at 0800   fexofenadine (ALLEGRA ALLERGY) 180 MG tablet Take 1 tablet (180 mg total) by mouth daily. 10 tablet 1 09/14/2021 at 0800   Incontinence Supply Disposable (COMFORT PROTECT ADULT DIAPER/L) MISC 1 Units by Does not apply route daily. Use 3-4 a day as needed 90 each 3 unknown at unknown   Insulin Pen Needle 32G X 6 MM MISC Daily 100 each 3 09/14/2021 at 0800   ketoconazole (NIZORAL) 2 % cream Apply 1 application topically daily. 60 g 2 prn at unknown   lisinopril (ZESTRIL) 40 MG tablet Take 1 tablet (40 mg total) by mouth daily. 90 tablet 3 09/14/2021 at 0800   melatonin 1 MG TABS tablet TAKE 1 TABLET BY MOUTH AT BEDTIME 30 tablet 1 09/13/2021 at 1700   metFORMIN  (GLUCOPHAGE) 500 MG tablet Take 1 tablet (500 mg total) by mouth 2 (two) times daily with a meal. 60 tablet 3 09/14/2021 at 0800   metoCLOPramide (REGLAN) 5 MG tablet TAKE 1 TABLET BY MOUTH 3 TIMES DAILY BEFORE MEALS 90 tablet 0 09/14/2021 at 0800   metoprolol succinate (TOPROL-XL) 25 MG 24 hr tablet Take 1 tablet (25 mg total) by mouth daily. 30 tablet 3 09/14/2021 at 0800   Misc. Devices MISC One pair of Compression stockings  Hemiplegia and hemiparesis following cerebral infarction affecting left non-dominant side (HCC)  - Primary Codes: Y69.485 Lower extremity edema  Codes: R60.0 Type 2 diabetes mellitus with hyperglycemia, with long-term current use of insulin (HCC)  Codes: E11.65, Z79.4 1 Units 0 unknown at 0800   omeprazole (PRILOSEC) 40 MG capsule TAKE 1 CAPSULE BY MOUTH IN THE MORNING AND AT BEDTIME 180 capsule 1 09/14/2021 at 0800   SENNA PLUS 8.6-50 MG tablet TAKE 2 TABLETS BY MOUTH 2 TIMES DAILY 360 tablet 4 09/13/2021 at 1700   sitaGLIPtin (JANUVIA) 100 MG tablet Take 1 tablet (100 mg total) by mouth daily. 30 tablet 6 09/14/2021 at 0800   Scheduled:  amLODipine  10 mg Oral Daily   apixaban  5 mg Oral BID  aspirin EC  81 mg Oral Daily   atorvastatin  80 mg Oral Daily   baclofen  10 mg Oral TID   Chlorhexidine Gluconate Cloth  6 each Topical Q0600   dapagliflozin propanediol  10 mg Oral QAC breakfast   docusate sodium  100 mg Oral BID   insulin aspart  0-5 Units Subcutaneous QHS   insulin aspart  0-9 Units Subcutaneous TID WC   irbesartan  300 mg Oral Daily   loratadine  10 mg Oral Daily   mouth rinse  15 mL Mouth Rinse BID   melatonin  2.5 mg Oral QHS   metoCLOPramide  5 mg Oral TID AC   metoprolol succinate  50 mg Oral Daily   pantoprazole  40 mg Oral Daily   senna  1 tablet Oral BID   sodium chloride flush  3 mL Intravenous Q12H   Continuous: [id number] **OR** acetaminophen, albuterol, hydrALAZINE, nitroGLYCERIN, oxyCODONE, promethazine Anti-infectives (From  admission, onward)    None         LOS: 1 day   Emeterio Reeve 09/15/2021, 7:43 AM

## 2021-09-15 NOTE — Telephone Encounter (Signed)
Pt's wife returned call that she had missed earlier. She stated that [name] is still in [facility] and today was moved to room 242.

## 2021-09-15 NOTE — TOC Progression Note (Signed)
Transition of Care (TOC) - Progression Note    Patient Details  Name: CAP WERNICKE Sr. MRN: [id number] Date of Birth: [birth year]  Transition of Care [facility]) CM/SW Contact  Shelbie Hutching, RN Phone Number: 09/15/2021, 11:48 AM  Clinical Narrative:     Transition of Care [facility]) Screening Note   Patient Details  Name: LAKIM LEWANDOSKI Sr. Date of Birth: 12/21/1955   Transition of Care [facility]) CM/SW Contact:    Shelbie Hutching, RN Phone Number: 09/15/2021, 11:48 AM    Transition of Care Department [facility]) has reviewed patient and no TOC needs have been identified at this time. We will continue to monitor patient advancement through interdisciplinary progression rounds. If new patient transition needs arise, please place a TOC consult.          Expected Discharge Plan and Services                                                 Social Determinants of Health (SDOH) Interventions    Readmission Risk Interventions No flowsheet data found.

## 2021-09-16 DIAGNOSIS — I161 Hypertensive emergency: Secondary | ICD-10-CM | POA: Diagnosis not present

## 2021-09-16 DIAGNOSIS — R079 Chest pain, unspecified: Secondary | ICD-10-CM | POA: Diagnosis not present

## 2021-09-16 LAB — CBC
HCT: 35 % — ABNORMAL LOW (ref 39.0–52.0)
Hemoglobin: 11.2 g/dL — ABNORMAL LOW (ref 13.0–17.0)
MCH: 28.4 pg (ref 26.0–34.0)
MCHC: 32 g/dL (ref 30.0–36.0)
MCV: 88.6 fL (ref 80.0–100.0)
Platelets: 200 10*3/uL (ref 150–400)
RBC: 3.95 MIL/uL — ABNORMAL LOW (ref 4.22–5.81)
RDW: 16 % — ABNORMAL HIGH (ref 11.5–15.5)
WBC: 5.9 10*3/uL (ref 4.0–10.5)
nRBC: 0 % (ref 0.0–0.2)

## 2021-09-16 LAB — BASIC METABOLIC PANEL
Anion gap: 9 (ref 5–15)
BUN: 19 mg/dL (ref 8–23)
CO2: 25 mmol/L (ref 22–32)
Calcium: 9.2 mg/dL (ref 8.9–10.3)
Chloride: 106 mmol/L (ref 98–111)
Creatinine, Ser: 1.29 mg/dL — ABNORMAL HIGH (ref 0.61–1.24)
GFR, Estimated: >60 mL/min (ref >60)
Glucose, Bld: 137 mg/dL — ABNORMAL HIGH (ref 70–99)
Potassium: 3.5 mmol/L (ref 3.5–5.1)
Sodium: 140 mmol/L (ref 135–145)

## 2021-09-16 LAB — GLUCOSE, CAPILLARY
Glucose-Capillary: 141 mg/dL — ABNORMAL HIGH (ref 70–99)
Glucose-Capillary: 124 mg/dL — ABNORMAL HIGH (ref 70–99)

## 2021-09-16 MED ORDER — NITROGLYCERIN 0.4 MG SL SUBL: 0.4000 mg | SUBLINGUAL_TABLET | SUBLINGUAL | 0 refills | Status: DC | PRN

## 2021-09-16 MED ORDER — IRBESARTAN 300 MG PO TABS: 300.0000 mg | ORAL_TABLET | Freq: Every day | ORAL | 0 refills | Status: DC

## 2021-09-16 MED ORDER — METOPROLOL SUCCINATE ER 50 MG PO TB24: 50.0000 mg | ORAL_TABLET | Freq: Every day | ORAL | 0 refills | Status: DC

## 2021-09-16 MED ORDER — TAMSULOSIN HCL 0.4 MG PO CAPS: 0.4000 mg | ORAL_CAPSULE | Freq: Every day | ORAL | 0 refills | Status: DC

## 2021-09-16 NOTE — Discharge Summary (Signed)
Physician Discharge Summary  Evan Townsend. QVZ:[id number] DOB: 12-Mar-1956 DOA: 09/14/2021  PCP: Charlynne Cousins, MD  Admit date: 09/14/2021 Discharge date: 09/16/2021  Admitted From: home Disposition: home  Recommendations for Outpatient Follow-up:  Follow up with PCP or with urology in 1 week or less:  BPH and urinary retention required Foley placement.  PCP to monitor HTN and labs on new medications (changed ACE to ARB, increased dose BB, added tamsulosin) Narrowing of celiac artery seen on CTA of the abdomen, ED provider discussed with vascular surgery, recommend outpatient follow-up Hepatic hyperenhancing foci seen on CTA of the abdomen, radiology recommended MRI outpatient follow-up Inflammatory nodules left lower lobe noted on CT, follow-up outpatient in 3 months  Home Health: NONE  Equipment/Devices: FOLEY CATHETER  Discharge Condition: good  CODE STATUS: FULL  Diet recommendation: Heart Healthy / Carb Modified   Brief/Interim Summary: Evan Townsend is a 66 year old male with past medical history hypertension, recurrent strokes, carotid stenosis, 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   Presented to ED 09/14/2021, with left-sided chest pain at rest, described as pressure.  No neurologic deficits (left-sided chronic deficits had not changed).  Home nitroglycerin unhelpful.  In ED, received nitroglycerin, morphine which was helpful.  Blood pressure well controlled for several years but looks like may be started becoming elevated November 2022.  Occasions include amlodipine, lisinopril, metoprolol and patient reports compliance with her medications.  Worked up for ACS in ED, troponin less than 10x2, no concerning EKG changes for ACS though did note right bundle branch block which appeared similar to previous EKGs. Blood pressure remained elevated. Eliquis discontinued and patient was placed on heparin drip and nitro drip.  CT angio  negative for aortic neurysm/dissection.  Admitted for ACS rule out, troponins and EKGs continue to be nonconcerning for ACS but blood pressure been slow to improve.   Patient is also had some urinary retention with postvoid residuals up to 500 cc.   Blood pressure dropped and nitroglycerin drip was discontinued, heparin drip was discontinued and patient was placed on home Eliquis Today, 09/16/2021, patient's blood pressure was significantly improved, no side effects from change/increase in medications.  Still has Foley in place, will arrange outpatient follow-up with urology/PCP, also started tamsulosin though advised patient that this would probably not improve BPH in the short-term.   Procedures, Other Significant Results:  See below for imaging reports and lab results    Consultants:  None     Discharge Diagnoses:  Principal Problem:   Hypertensive emergency without congestive heart failure Active Problems:   Recurrent strokes ([place])   Essential hypertension   Diabetes mellitus ([place])   Hyperlipidemia LDL goal <70   Hemiplegia and hemiparesis following cerebral infarction affecting left non-dominant side (HCC)   RBBB   Atrial fibrillation (HCC)   CKD (chronic kidney disease) stage 2, GFR 60-89 ml/min     Discharge Instructions  Discharge Instructions     Diet - low sodium heart healthy   Complete by: As directed    Diet Carb Modified   Complete by: As directed    Increase activity slowly   Complete by: As directed       Allergies as of 09/16/2021   No Known Allergies      Medication List     STOP taking these medications    lisinopril 40 MG tablet Commonly known as: ZESTRIL       TAKE these medications    Accu-Chek Guide test strip Generic drug:  glucose blood Check fsbs once daily   Accu-Chek Softclix Lancets lancets SMARTSIG:Topical   albuterol 108 (90 Base) MCG/ACT inhaler Commonly known as: VENTOLIN HFA Inhale 2 puffs into the lungs every 6  (six) hours as needed for wheezing or shortness of breath.   amLODipine 10 MG tablet Commonly known as: NORVASC Take 1 tablet (10 mg total) by mouth daily.   apixaban 5 MG Tabs tablet Commonly known as: Eliquis Take 1 tablet (5 mg total) by mouth 2 (two) times daily.   atorvastatin 80 MG tablet Commonly known as: LIPITOR Take 1 tablet (80 mg total) by mouth daily.   baclofen 10 MG tablet Commonly known as: LIORESAL Take 1 tablet (10 mg total) by mouth 3 (three) times daily.   Comfort Protect Adult Diaper/L Misc 1 Units by Does not apply route daily. Use 3-4 a day as needed   dapagliflozin propanediol 10 MG Tabs tablet Commonly known as: Farxiga Take 1 tablet (10 mg total) by mouth daily before breakfast. In place of glipizide   fexofenadine 180 MG tablet Commonly known as: Allegra Allergy Take 1 tablet (180 mg total) by mouth daily.   Fifty50 Glucose Meter 2.0 w/Device Kit Use as instructed   Blood Glucose Monitoring Suppl Kit Accucheck Guide with lancets and test strips #100 with one refill.  Test blood sugar twice daily. DX Code E11.65 ; Z79.4   FreeStyle Libre 2 Sensor Misc 1 each by Does not apply route every 14 (fourteen) days.   Insulin Pen Needle 32G X 6 MM Misc Daily   irbesartan 300 MG tablet Commonly known as: AVAPRO Take 1 tablet (300 mg total) by mouth daily. Start taking on: September 17, 2021   ketoconazole 2 % cream Commonly known as: NIZORAL Apply 1 application topically daily.   melatonin 1 MG Tabs tablet TAKE 1 TABLET BY MOUTH AT BEDTIME   metFORMIN 500 MG tablet Commonly known as: GLUCOPHAGE Take 1 tablet (500 mg total) by mouth 2 (two) times daily with a meal.   metoCLOPramide 5 MG tablet Commonly known as: REGLAN TAKE 1 TABLET BY MOUTH 3 TIMES DAILY BEFORE MEALS   metoprolol succinate 50 MG 24 hr tablet Commonly known as: TOPROL-XL Take 1 tablet (50 mg total) by mouth daily. Take with or immediately following a meal. Start taking on:  September 17, 2021 What changed:  medication strength how much to take additional instructions   Misc. Devices Misc One pair of Compression stockings  Hemiplegia and hemiparesis following cerebral infarction affecting left non-dominant side (HCC)  - Primary Codes: K93.267 Lower extremity edema  Codes: R60.0 Type 2 diabetes mellitus with hyperglycemia, with long-term current use of insulin (HCC)  Codes: E11.65, Z79.4   nitroGLYCERIN 0.4 MG SL tablet Commonly known as: NITROSTAT Place 1 tablet (0.4 mg total) under the tongue every 5 (five) minutes as needed for chest pain.   omeprazole 40 MG capsule Commonly known as: PRILOSEC TAKE 1 CAPSULE BY MOUTH IN THE MORNING AND AT BEDTIME   Senna Plus 8.6-50 MG tablet Generic drug: senna-docusate TAKE 2 TABLETS BY MOUTH 2 TIMES DAILY   sitaGLIPtin 100 MG tablet Commonly known as: Januvia Take 1 tablet (100 mg total) by mouth daily.   tamsulosin 0.4 MG Caps capsule Commonly known as: FLOMAX Take 1 capsule (0.4 mg total) by mouth daily. Start taking on: September 17, 2021         Follow-up Information     Esco, Mariann Barter, MD. Schedule an appointment as soon as possible for  a visit .   Specialty: Vascular Surgery Why: Appointment 09/18/2021 at 3:15 with  Rica Koyanagi, MD Contact information: [address] [place] [address] [phone]         Abbie Sons, MD. Schedule an appointment as soon as possible for a visit in 3 day(s).   Specialty: Urology Why: urinary retention due to BPH - discharged from the hospital with foley Contact information: [place] [address] [phone]                No Known Allergies    Procedures/Studies: DG Chest 2 View  Result Date: 09/14/2021 CLINICAL DATA:  66 year old male with history of chest pain. EXAM: CHEST - 2 VIEW COMPARISON:  Chest x-ray 09/20/2020. FINDINGS: Lung volumes are normal. No consolidative airspace disease. No  pleural effusions. No pneumothorax. No pulmonary nodule or mass noted. Pulmonary vasculature and the cardiomediastinal silhouette are within normal limits. IMPRESSION: No radiographic evidence of acute cardiopulmonary disease. Electronically Signed   By: Vinnie Langton M.D.   On: 09/14/2021 12:02   CT Angio Chest/Abd/Pel for Dissection W and/or Wo Contrast  Result Date: 09/14/2021 CLINICAL DATA:  Acute aortic syndrome suspected. EXAM: CT ANGIOGRAPHY CHEST, ABDOMEN AND PELVIS TECHNIQUE: Non-contrast CT of the chest was initially obtained. Multidetector CT imaging through the chest, abdomen and pelvis was performed using the standard protocol during bolus administration of intravenous contrast. Multiplanar reconstructed images and MIPs were obtained and reviewed to evaluate the vascular anatomy. RADIATION DOSE REDUCTION: This exam was performed according to the departmental dose-optimization program which includes automated exposure control, adjustment of the mA and/or kV according to patient size and/or use of iterative reconstruction technique. CONTRAST:  13m OMNIPAQUE IOHEXOL 350 MG/ML SOLN COMPARISON:  May 16, 2021. FINDINGS: CTA CHEST FINDINGS Cardiovascular: Non contrasted sequence demonstrates aortic atherosclerosis and coronary artery calcifications without intramural hematoma. No evidence of thoracic aortic aneurysm or dissection. No central pulmonary embolus. Normal size heart. No abnormal intracardiac filling defect. No significant pericardial effusion/thickening. Mediastinum/Nodes: No discrete thyroid nodule. No pathologically enlarged mediastinal, hilar or axillary lymph nodes. The trachea and esophagus are unremarkable. Lungs/Pleura: Cluster of nodularity in the posterior left lower lobe for instance on image 69/6 and in the lateral left lower lobe on image 92/6 are favored infectious or inflammatory, recommend follow-up dedicated chest CT in 3 months to ensure resolution. No pleural  effusion. No pneumothorax. Musculoskeletal: No acute osseous abnormality. Review of the MIP images confirms the above findings. CTA ABDOMEN AND PELVIS FINDINGS VASCULAR Aorta: Scattered calcified noncalcified aortic plaque. Normal caliber aorta without aneurysm, dissection, vasculitis or significant stenosis. Celiac: There is narrowing of the celiac artery at the ostia with upwards hooked appearance of the celiac artery and post stenotic dilation. SMA: Patent without evidence of aneurysm, dissection, vasculitis or significant stenosis. Renals: Renal arteries are patent without evidence of aneurysm, dissection, vasculitis, fibromuscular dysplasia or significant stenosis. IMA: Patent without evidence of aneurysm, dissection, vasculitis or significant stenosis. Inflow: Patent without evidence of aneurysm, dissection, vasculitis or significant stenosis. Veins: No obvious venous abnormality within the limitations of this arterial phase study. Review of the MIP images confirms the above findings. NON-VASCULAR Hepatobiliary: Hyperenhancing hepatic foci including a segment VIII focus measuring 10 mm on image 64/5, a segment II focus measuring 5 mm on image 64/5 and a anterior segment IVa focus measuring 4 mm on image 77/5. Gallbladder is unremarkable. No biliary ductal dilation. Pancreas: No pancreatic ductal dilation or evidence of acute inflammation. Spleen: Within  normal limits. Adrenals/Urinary Tract: Bilateral adrenal glands appear normal. No hydronephrosis. No solid enhancing renal mass. Urinary bladder is distended without abnormal wall thickening. Stomach/Bowel: No enteric contrast was administered. Stomach is unremarkable for degree of distension. No pathologic dilation of small or large bowel. The appendix and terminal ileum appear normal. No evidence of acute bowel inflammation. Lymphatic: No pathologically enlarged abdominal or pelvic lymph nodes. Reproductive: Prostatomegaly with median lobe hypertrophy. Other:  No significant abdominopelvic free fluid. Musculoskeletal: L5-S1 discogenic disease. No acute osseous abnormality. Review of the MIP images confirms the above findings. IMPRESSION: 1. No evidence of thoracic or abdominal aortic aneurysm or dissection. 2. 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 3. Hyperenhancing hepatic foci measuring up to 10 mm, which may reflect flash filling hemangiomas or intrahepatic shunts but are incompletely evaluated. Recommend more definitive characterization with nonemergent hepatic protocol abdominal MRI with and without contrast preferably as an outpatient when patient's acute symptomatology has resolved and is able to follow commands including breath hold. 4. Cluster of nodularity in the posterior left lower lobe are favored infectious or inflammatory in etiology. Suggest follow-up dedicated chest CT in 3 months to ensure stability/resolution. 5. Prostatomegaly with median lobe hypertrophy. 6. Distended urinary bladder without abnormal wall thickening, which may reflect outflow impedance. 7.  Aortic Atherosclerosis (ICD10-I70.0). Electronically Signed   By: Dahlia Bailiff M.D.   On: 09/14/2021 13:02       Subjective: Patient feeling well today, no additional episodes of chest pain, would like to go home if possible.  Denies shortness of breath, orthopnea, lower extremity edema.   Discharge Exam: Vitals:   09/16/21 0708 09/16/21 1146  BP: (!) 141/84 106/65  Pulse: 71 (!) 55  Resp: 16 20  Temp: 97.6 F (36.4 C) 98.2 F (36.8 C)  SpO2: 100% 100%   Vitals:   09/15/21 2332 09/16/21 0449 09/16/21 0708 09/16/21 1146  BP: (!) 141/94 132/75 (!) 141/84 106/65  Pulse: 66 66 71 (!) 55  Resp: 16 18 16 20   Temp: (!) 97.5 F (36.4 C) 98.3 F (36.8 C) 97.6 F (36.4 C) 98.2 F (36.8 C)  TempSrc:    Oral  SpO2: 100% 99% 100% 100%  Weight:       Height:        General: Pt is alert, awake, not in acute distress Cardiovascular: RRR, S1/S2 +, no rubs, no gallops Respiratory: CTA bilaterally, no wheezing, no rhonchi Abdominal: Soft, NT, ND, bowel sounds + Extremities: no edema, no cyanosis     The results of significant diagnostics from this hospitalization (including imaging, microbiology, ancillary and laboratory) are listed below for reference.     Microbiology: Recent Results (from the past 240 hour(s))  Resp Panel by RT-PCR (Flu A&B, Covid) Nasopharyngeal Swab     Status: None   Collection Time: 09/14/21  2:48 PM   Specimen: Nasopharyngeal Swab; Nasopharyngeal(NP) swabs in vial transport medium  Result Value Ref Range Status   SARS Coronavirus 2 by RT PCR NEGATIVE NEGATIVE Final    Comment: (NOTE) SARS-CoV-2 target nucleic acids are NOT DETECTED.  The SARS-CoV-2 RNA is generally detectable in upper respiratory specimens during the acute phase of infection. The lowest concentration of SARS-CoV-2 viral copies this assay can detect is 138 copies/mL. A negative result does not preclude SARS-Cov-2 infection and should not be used as the sole basis for treatment or other patient management decisions.  A negative result may occur with  improper specimen collection/handling, submission of specimen other than nasopharyngeal swab, presence of viral mutation(s) within the areas targeted by this assay, and inadequate number of viral copies(<138 copies/mL). A negative result must be combined with clinical observations, patient history, and epidemiological information. The expected result is Negative.  Fact Sheet for Patients:  EntrepreneurPulse.com.au  Fact Sheet for Healthcare Providers:  IncredibleEmployment.be  This test is no t yet approved or cleared by the [place] FDA and  has been authorized for detection and/or diagnosis of SARS-CoV-2 by FDA under an Emergency Use  Authorization (EUA). This EUA will remain  in effect (meaning this test can be used) for the duration of the COVID-19 declaration under Section 564(b)(1) of the Act, 21 U.S.C.section 360bbb-3(b)(1), unless the authorization is terminated  or revoked sooner.       Influenza A by PCR NEGATIVE NEGATIVE Final   Influenza B by PCR NEGATIVE NEGATIVE Final    Comment: (NOTE) The Xpert Xpress SARS-CoV-2/FLU/RSV plus assay is intended as an aid in the diagnosis of influenza from Nasopharyngeal swab specimens and should not be used as a sole basis for treatment. Nasal washings and aspirates are unacceptable for Xpert Xpress SARS-CoV-2/FLU/RSV testing.  Fact Sheet for Patients: EntrepreneurPulse.com.au  Fact Sheet for Healthcare Providers: IncredibleEmployment.be  This test is not yet approved or cleared by the [place] FDA and has been authorized for detection and/or diagnosis of SARS-CoV-2 by FDA under an Emergency Use Authorization (EUA). This EUA will remain in effect (meaning this test can be used) for the duration of the COVID-19 declaration under Section 564(b)(1) of the Act, 21 U.S.C. section 360bbb-3(b)(1), unless the authorization is terminated or revoked.  Performed at [facility], [address] [place], [place] [address]   MRSA Next Gen by PCR, Nasal     Status: None   Collection Time: 09/14/21  4:40 PM   Specimen: Nasal Mucosa; Nasal Swab  Result Value Ref Range Status   MRSA by PCR Next Gen NOT DETECTED NOT DETECTED Final    Comment: (NOTE) The GeneXpert MRSA Assay (FDA approved for NASAL specimens only), is one component of a comprehensive MRSA colonization surveillance program. It is not intended to diagnose MRSA infection nor to guide or monitor treatment for MRSA infections. Test performance is not FDA approved in patients less than 31 years old. Performed at [facility], [address]  [place], [place] [address]      Labs: BNP (last 3 results) No results for input(s): BNP in the last 8760 hours. Basic Metabolic Panel: Recent Labs  Lab 09/14/21 1036 09/15/21 0305 09/16/21 0557  NA 141 140 140  K 3.3* 3.6 3.5  CL 105 106 106  CO2 24 27 25   GLUCOSE 227* 162* 137*  BUN 18 17 19   CREATININE 1.21 1.11 1.29*  CALCIUM 9.6 9.3 9.2   Liver Function Tests: Recent Labs  Lab 09/14/21 1036  AST 33  ALT 56*  ALKPHOS 180*  BILITOT 0.6  PROT 7.9  ALBUMIN 3.9   Recent Labs  Lab 09/14/21 1036  LIPASE 39   No results for input(s): AMMONIA in the last 168 hours. CBC: Recent Labs  Lab 09/14/21 1036 09/15/21 0305 09/16/21 0557  WBC 6.7 5.9 5.9  HGB 12.3* 11.5* 11.2*  HCT 38.0* 35.2* 35.0*  MCV 88.0 85.2 88.6  PLT 284 231 200   Cardiac Enzymes: No results for input(s): CKTOTAL, CKMB, CKMBINDEX, TROPONINI in the last 168 hours. BNP: Invalid  input(s): POCBNP CBG: Recent Labs  Lab 09/15/21 1121 09/15/21 1627 09/15/21 2106 09/16/21 0713 09/16/21 1148  GLUCAP 178* 109* 176* 141* 124*   D-Dimer No results for input(s): DDIMER in the last 72 hours. Hgb A1c No results for input(s): HGBA1C in the last 72 hours. Lipid Profile No results for input(s): CHOL, HDL, LDLCALC, TRIG, CHOLHDL, LDLDIRECT in the last 72 hours. Thyroid function studies Recent Labs    09/14/21 1813  TSH 0.977   Anemia work up No results for input(s): VITAMINB12, FOLATE, FERRITIN, TIBC, IRON, RETICCTPCT in the last 72 hours. Urinalysis    Component Value Date/Time   COLORURINE YELLOW 05/16/2021 1101   APPEARANCEUR CLEAR 05/16/2021 1101   APPEARANCEUR Clear 02/26/2021 1124   LABSPEC 1.020 05/16/2021 1101   PHURINE 5.5 05/16/2021 1101   GLUCOSEU >1,000 (A) 05/16/2021 1101   HGBUR SMALL (A) 05/16/2021 1101   BILIRUBINUR NEGATIVE 05/16/2021 1101   BILIRUBINUR Negative 02/26/2021 1124   KETONESUR NEGATIVE 05/16/2021 1101   PROTEINUR >300 (A) 05/16/2021 1101   NITRITE NEGATIVE  05/16/2021 1101   LEUKOCYTESUR NEGATIVE 05/16/2021 1101   Sepsis Labs Invalid input(s): PROCALCITONIN,  WBC,  LACTICIDVEN Microbiology Recent Results (from the past 240 hour(s))  Resp Panel by RT-PCR (Flu A&B, Covid) Nasopharyngeal Swab     Status: None   Collection Time: 09/14/21  2:48 PM   Specimen: Nasopharyngeal Swab; Nasopharyngeal(NP) swabs in vial transport medium  Result Value Ref Range Status   SARS Coronavirus 2 by RT PCR NEGATIVE NEGATIVE Final    Comment: (NOTE) SARS-CoV-2 target nucleic acids are NOT DETECTED.  The SARS-CoV-2 RNA is generally detectable in upper respiratory specimens during the acute phase of infection. The lowest concentration of SARS-CoV-2 viral copies this assay can detect is 138 copies/mL. A negative result does not preclude SARS-Cov-2 infection and should not be used as the sole basis for treatment or other patient management decisions. A negative result may occur with  improper specimen collection/handling, submission of specimen other than nasopharyngeal swab, presence of viral mutation(s) within the areas targeted by this assay, and inadequate number of viral copies(<138 copies/mL). A negative result must be combined with clinical observations, patient history, and epidemiological information. The expected result is Negative.  Fact Sheet for Patients:  EntrepreneurPulse.com.au  Fact Sheet for Healthcare Providers:  IncredibleEmployment.be  This test is no t yet approved or cleared by the [place] FDA and  has been authorized for detection and/or diagnosis of SARS-CoV-2 by FDA under an Emergency Use Authorization (EUA). This EUA will remain  in effect (meaning this test can be used) for the duration of the COVID-19 declaration under Section 564(b)(1) of the Act, 21 U.S.C.section 360bbb-3(b)(1), unless the authorization is terminated  or revoked sooner.       Influenza A by PCR NEGATIVE NEGATIVE  Final   Influenza B by PCR NEGATIVE NEGATIVE Final    Comment: (NOTE) The Xpert Xpress SARS-CoV-2/FLU/RSV plus assay is intended as an aid in the diagnosis of influenza from Nasopharyngeal swab specimens and should not be used as a sole basis for treatment. Nasal washings and aspirates are unacceptable for Xpert Xpress SARS-CoV-2/FLU/RSV testing.  Fact Sheet for Patients: EntrepreneurPulse.com.au  Fact Sheet for Healthcare Providers: IncredibleEmployment.be  This test is not yet approved or cleared by the [place] FDA and has been authorized for detection and/or diagnosis of SARS-CoV-2 by FDA under an Emergency Use Authorization (EUA). This EUA will remain in effect (meaning this test can be used) for the duration of the COVID-19  declaration under Section 564(b)(1) of the Act, 21 U.S.C. section 360bbb-3(b)(1), unless the authorization is terminated or revoked.  Performed at [facility], [address] [place], [place] [address]   MRSA Next Gen by PCR, Nasal     Status: None   Collection Time: 09/14/21  4:40 PM   Specimen: Nasal Mucosa; Nasal Swab  Result Value Ref Range Status   MRSA by PCR Next Gen NOT DETECTED NOT DETECTED Final    Comment: (NOTE) The GeneXpert MRSA Assay (FDA approved for NASAL specimens only), is one component of a comprehensive MRSA colonization surveillance program. It is not intended to diagnose MRSA infection nor to guide or monitor treatment for MRSA infections. Test performance is not FDA approved in patients less than 63 years old. Performed at [facility], [address] [place], [place] [address]      Time coordinating discharge: Over 30 minutes  SIGNED:    Emeterio Reeve DO [facility]

## 2021-09-17 ENCOUNTER — Telehealth: Payer: Self-pay | Admitting: *Deleted

## 2021-09-17 NOTE — Telephone Encounter (Signed)
Transition Care Management Unsuccessful Follow-up Telephone Call  Date of discharge and from where:  09/16/2021 [facility]  Attempts:  1st Attempt  Reason for unsuccessful TCM follow-up call:  Left voice message

## 2021-09-18 ENCOUNTER — Encounter (INDEPENDENT_AMBULATORY_CARE_PROVIDER_SITE_OTHER): Payer: Medicaid Other | Admitting: Vascular Surgery

## 2021-09-18 ENCOUNTER — Ambulatory Visit: Payer: Medicaid Other | Admitting: Podiatry

## 2021-09-18 NOTE — Telephone Encounter (Signed)
Transition Care Management Unsuccessful Follow-up Telephone Call  Date of discharge and from where:  09/16/2021 [facility]  Attempts:  2nd Attempt  Reason for unsuccessful TCM follow-up call:  Left voice message

## 2021-09-19 NOTE — Telephone Encounter (Signed)
Transition Care Management Unsuccessful Follow-up Telephone Call  Date of discharge and from where:  09/16/2021 from [facility]  Attempts:  3rd Attempt  Reason for unsuccessful TCM follow-up call:  Unable to reach patient

## 2021-09-22 ENCOUNTER — Ambulatory Visit: Payer: Medicaid Other | Admitting: Urology

## 2021-09-22 ENCOUNTER — Other Ambulatory Visit: Payer: Self-pay

## 2021-09-22 ENCOUNTER — Emergency Department
Admission: EM | Admit: 2021-09-22 | Discharge: 2021-09-22 | Disposition: A | Discharge status: Home / Self Care | Payer: Medicaid Other | Attending: Emergency Medicine | Admitting: Emergency Medicine

## 2021-09-22 ENCOUNTER — Ambulatory Visit: Payer: Medicaid Other | Admitting: Internal Medicine

## 2021-09-22 DIAGNOSIS — D649 Anemia, unspecified: Secondary | ICD-10-CM | POA: Diagnosis not present

## 2021-09-22 DIAGNOSIS — R7309 Other abnormal glucose: Secondary | ICD-10-CM | POA: Diagnosis not present

## 2021-09-22 DIAGNOSIS — Z466 Encounter for fitting and adjustment of urinary device: Secondary | ICD-10-CM | POA: Diagnosis not present

## 2021-09-22 DIAGNOSIS — Z978 Presence of other specified devices: Secondary | ICD-10-CM

## 2021-09-22 DIAGNOSIS — R300 Dysuria: Secondary | ICD-10-CM | POA: Diagnosis present

## 2021-09-22 DIAGNOSIS — T83098A Other mechanical complication of other indwelling urethral catheter, initial encounter: Secondary | ICD-10-CM | POA: Diagnosis not present

## 2021-09-22 LAB — CBC WITH DIFFERENTIAL/PLATELET
Abs Immature Granulocytes: 0.01 10*3/uL (ref 0.00–0.07)
Basophils Absolute: 0 10*3/uL (ref 0.0–0.1)
Basophils Relative: 0 %
Eosinophils Absolute: 0.1 10*3/uL (ref 0.0–0.5)
Eosinophils Relative: 2 %
HCT: 31 % — ABNORMAL LOW (ref 39.0–52.0)
Hemoglobin: 10.2 g/dL — ABNORMAL LOW (ref 13.0–17.0)
Immature Granulocytes: 0 %
Lymphocytes Relative: 28 %
Lymphs Abs: 1.5 10*3/uL (ref 0.7–4.0)
MCH: 29.2 pg (ref 26.0–34.0)
MCHC: 32.9 g/dL (ref 30.0–36.0)
MCV: 88.8 fL (ref 80.0–100.0)
Monocytes Absolute: 0.5 10*3/uL (ref 0.1–1.0)
Monocytes Relative: 10 %
Neutro Abs: 3.2 10*3/uL (ref 1.7–7.7)
Neutrophils Relative %: 60 %
Platelets: 251 10*3/uL (ref 150–400)
RBC: 3.49 MIL/uL — ABNORMAL LOW (ref 4.22–5.81)
RDW: 16.1 % — ABNORMAL HIGH (ref 11.5–15.5)
WBC: 5.4 10*3/uL (ref 4.0–10.5)
nRBC: 0 % (ref 0.0–0.2)

## 2021-09-22 LAB — COMPREHENSIVE METABOLIC PANEL
ALT: 28 U/L (ref 0–44)
AST: 19 U/L (ref 15–41)
Albumin: 3.2 g/dL — ABNORMAL LOW (ref 3.5–5.0)
Alkaline Phosphatase: 163 U/L — ABNORMAL HIGH (ref 38–126)
Anion gap: 7 (ref 5–15)
BUN: 37 mg/dL — ABNORMAL HIGH (ref 8–23)
CO2: 23 mmol/L (ref 22–32)
Calcium: 8.9 mg/dL (ref 8.9–10.3)
Chloride: 108 mmol/L (ref 98–111)
Creatinine, Ser: 1.11 mg/dL (ref 0.61–1.24)
GFR, Estimated: >60 mL/min (ref >60)
Glucose, Bld: 213 mg/dL — ABNORMAL HIGH (ref 70–99)
Potassium: 4.2 mmol/L (ref 3.5–5.1)
Sodium: 138 mmol/L (ref 135–145)
Total Bilirubin: 0.3 mg/dL (ref 0.3–1.2)
Total Protein: 7.2 g/dL (ref 6.5–8.1)

## 2021-09-22 NOTE — ED Notes (Signed)
Unable to collect urine in triage. Patient with pre-existing foley and leg bag and wearing long pants, unable to stand.

## 2021-09-22 NOTE — ED Triage Notes (Signed)
Patient to ER via Pov with wife. Reports patient was discharged February 1st with a foley catheter placed, pt had catheter placed due to prostate issues potentially blocking patient from urinating. Wife reports patient has been complaining of increased pain at tip of penis. Reports some drainage from penis. Reports catheter has been draining well, clear urine in bag, no bleeding or pus present in drainage bag.

## 2021-09-22 NOTE — Discharge Instructions (Addendum)
It looks like your appointment is at [facility].  We offered to replace the Foley here and to place a new one and check for UTI but you have declined and will follow-up with their office tomorrow.  Return to the ER if you develop any fevers or other concerns

## 2021-09-22 NOTE — ED Provider Notes (Signed)
[facility] Provider Note    Event Date/Time   First MD Initiated Contact with Patient 09/22/21 1503     (approximate)   History   Dysuria   HPI  Evan SCHOPF Sr. is a 66 y.o. male who comes in with concern for issues with the family.  Patient was just discharged 09-28-2022 with a Foley catheter in place due to having issues with urination.  He was having retention when he was in the hospital.  He reports some pain at the tip of his penis and some drainage from the penis.  The catheter has been draining well.  No pain in the testicles   On review of records patient was seen by myself on 1/29 for unstable chest pain but given patient had continued pain even after negative cardiac markers we decided to admit patient and placed him on heparin and nitro drip.  I reviewed the discharge summary when patient was admitted they noted that patient had some urinary retention and he had to have a Foley placed.   Physical Exam   Triage Vital Signs: ED Triage Vitals  Enc Vitals Group     BP 09/22/21 1413 (!) 148/81     Pulse Rate 09/22/21 1410 99     Resp 09/22/21 1410 18     Temp 09/22/21 1410 98.2 F (36.8 C)     Temp Source 09/22/21 1410 Oral     SpO2 09/22/21 1410 100 %     Weight 09/22/21 1412 145 lb 1 oz (65.8 kg)     Height 09/22/21 1412 5' 6"  (1.676 m)     Head Circumference --      Peak Flow --      Pain Score 09/22/21 1412 7     Pain Loc --      Pain Edu? --      Excl. in [place]? --     Most recent vital signs: Vitals:   09/22/21 1410 09/22/21 1413  BP:  (!) 148/81  Pulse: 99   Resp: 18   Temp: 98.2 F (36.8 C)   SpO2: 100%      General: Awake, no distress.  CV:  Good peripheral perfusion.  Resp:  Normal effort.  Abd:  No distention.  Other:  Baseline neurodeficits noted.  Patient also has Foley catheter noted in his penis without any drainage from the penis.  No redness or erythema.  Testicles are nontender without any skin changes  overlying them.   ED Results / Procedures / Treatments   Labs (all labs ordered are listed, but only abnormal results are displayed) Labs Reviewed  CBC WITH DIFFERENTIAL/PLATELET - Abnormal; Notable for the following components:      Result Value   RBC 3.49 (*)    Hemoglobin 10.2 (*)    HCT 31.0 (*)    RDW 16.1 (*)    All other components within normal limits  COMPREHENSIVE METABOLIC PANEL - Abnormal; Notable for the following components:   Glucose, Bld 213 (*)    BUN 37 (*)    Albumin 3.2 (*)    Alkaline Phosphatase 163 (*)    All other components within normal limits  URINALYSIS, ROUTINE W REFLEX MICROSCOPIC     IMPRESSION / MDM / ASSESSMENT AND PLAN / ED COURSE  I reviewed the triage vital signs and the nursing notes.  Differential diagnosis includes, but is not limited to, discomfort of Foley catheter versus UTI.  Discussed with family that in order to check for a UTI we have to replace the Foley to get a clean-catch otherwise it could just look like colonization.  They state that they have a urology appointment tomorrow morning.  I recommended that a Foley should not be left out until that time given his history of retention and that they need to be put in until they discuss further with urology.  Cmp shows slightly elevated glucose but no evidence of DKA.  Alk phos slightly elevated but similar to prior CBC shows slightly low hemoglobin within a point range of 6 days ago.  They denied any blood in his urine or dark black stools.  No white count elevation   Given patient's white count is normal patient is afebrile without any drainage noted from the tip of the penis they are declining replacing the Foley today and checking for UTI and plan to just follow-up with urology tomorrow instead.  I discussed return precautions including fevers, worsening discomfort or any other concerns but at this time they just want to follow-up outpatient with urology  tomorrow morning this is reasonable given very close follow-up there is a chance that they may even have the Foley removed tomorrow.     FINAL CLINICAL IMPRESSION(S) / ED DIAGNOSES   Final diagnoses:  Foley catheter in place     Rx / DC Orders   ED Discharge Orders     None        Note:  This document was prepared using Dragon voice recognition software and may include unintentional dictation errors.   Vanessa Hurstbourne, MD 09/22/21 [phone]

## 2021-09-23 ENCOUNTER — Ambulatory Visit (INDEPENDENT_AMBULATORY_CARE_PROVIDER_SITE_OTHER): Payer: Medicaid Other | Admitting: Urology

## 2021-09-23 ENCOUNTER — Ambulatory Visit: Payer: Medicaid Other | Admitting: Urology

## 2021-09-23 ENCOUNTER — Telehealth: Payer: Self-pay

## 2021-09-23 VITALS — BP 132/80 | HR 80 | Ht 64.4 in | Wt 145.0 lb

## 2021-09-23 DIAGNOSIS — N138 Other obstructive and reflux uropathy: Secondary | ICD-10-CM

## 2021-09-23 DIAGNOSIS — Z466 Encounter for fitting and adjustment of urinary device: Secondary | ICD-10-CM

## 2021-09-23 DIAGNOSIS — N401 Enlarged prostate with lower urinary tract symptoms: Secondary | ICD-10-CM | POA: Diagnosis not present

## 2021-09-23 LAB — BLADDER SCAN AMB NON-IMAGING

## 2021-09-23 MED ORDER — CEPHALEXIN 250 MG PO CAPS
500.0000 mg | ORAL_CAPSULE | Freq: Once | ORAL | Status: AC
  Administered 2021-09-23: 500 mg via ORAL

## 2021-09-23 MED ORDER — TAMSULOSIN HCL 0.4 MG PO CAPS: 0.4000 mg | ORAL_CAPSULE | Freq: Every day | ORAL | 11 refills | Status: DC

## 2021-09-23 NOTE — Progress Notes (Signed)
Catheter Removal  Patient is present today for a catheter removal. 8ml of water was drained from the balloon. A 16FR foley cath was removed from the bladder no complications were noted. Patient tolerated well.  Performed by: Debbe Bales, CMA   Follow up/ Additional notes: RTC this afternoon for PVR

## 2021-09-23 NOTE — Patient Instructions (Signed)

## 2021-09-23 NOTE — Progress Notes (Signed)
09/23/21 10:14 AM   Evan Cones Sr. 07-30-1956 [id number]  CC: BPH and urinary retention  HPI: Comorbid 66 year old male with history of stroke who was recently admitted for chest pain and ACS rule out 09/14/2021-09/16/2021.  CT angio on 09/14/2021 showed distended bladder, prostate measured 54 g, and from chart review it sounds like a Foley was placed for elevated PVRs.  He was not having any urinary symptoms or pelvic discomfort at that time.  He denies any prior history of urinary symptoms prior to that hospitalization, and denies any gross hematuria, incontinence, or trouble urinating.  PSA was normal at 2.3 in July 2022.   PMH: Past Medical History:  Diagnosis Date   Carotid arterial disease ([place])    a. 08/2018 Carotid U/S: min-mod RICA atherosclerosis w/o hemodynamically significant stenosis. Nl LICA.   Diabetes 1.5, managed as type 2 ([place])    Diastolic dysfunction    a. 08/2018 Echo: EF 65%. No rwma. Gr1 DD. Mild MR.   Diastolic dysfunction    a. 08/2019 Echo: EF 55-60%, Gr1 DD. No rwma. Mild MR. RVSP 37.41mmHg.   Hypercholesterolemia    Hypertension    PAF (paroxysmal atrial fibrillation) ([place])    a. 10/2019 Event Monitor: PAF; b. CHA2DS2VASc = 5-->Eliquis.   Poorly controlled diabetes mellitus ([place])    a. 04/2019 A1c 13.8.   Recurrent strokes ([place])    a. 10/2016 MRI/A: Acute 94mm R thalamic infarct, ? subacute infarct of R corona radiata; b. 08/2017 MRI/A: Acute 37mm lateral L thalamic infarct. Other more remote lacunar infarcts of thalami bilat. Small vessel dzs; c. 08/2018 MRI/A: Acute lacunar infarct of the post limb of R internal capsule; d. 08/2019 MRI Acute CVA of L paramedian pons adn R cerebellar hemisphere.   Tobacco abuse     Surgical History: Past Surgical History:  Procedure Laterality Date   ESOPHAGOGASTRODUODENOSCOPY (EGD) WITH PROPOFOL N/A 04/26/2019   Procedure: ESOPHAGOGASTRODUODENOSCOPY (EGD) WITH PROPOFOL;  Surgeon: Lin Landsman, MD;  Location: [facility]  ENDOSCOPY;  Service: Gastroenterology;  Laterality: N/A;     Family History: Family History  Problem Relation Age of Onset   Hypertension Mother    Stroke Mother        died @ age 40   Hypertension Father    Diabetes Father    Heart attack Father        died @ 55    Social History:  reports that he has quit smoking. His smoking use included cigarettes. He started smoking about 41 years ago. He has a 38.00 pack-year smoking history. He has never used smokeless tobacco. He reports that he does not currently use alcohol after a past usage of about 1.0 standard drink per week. He reports that he does not currently use drugs after having used the following drugs: Cocaine and Marijuana.  Physical Exam: BP 132/80 (BP Location: Right Arm, Patient Position: Sitting, Cuff Size: Normal)    Pulse 80    Ht 5' 4.4" (1.636 m)    Wt 145 lb (65.8 kg)    BMI 24.58 kg/m    Constitutional: In wheelchair, frail-appearing Cardiovascular: No clubbing, cyanosis, or edema. Respiratory: Normal respiratory effort, no increased work of breathing. GI: Abdomen is soft, nontender, nondistended, no abdominal masses GU: Circumcised phallus with patent meatus, Foley in place with yellow urine  Laboratory Data: Reviewed, see HPI  Pertinent Imaging: I have personally viewed and interpreted the CT scans from September 2022 as well as recently 09/14/2021.  Prostate measures 54 g, no  hydronephrosis, bladder distended on most recent CT.  Assessment & Plan:   66 year old male with a number of comorbidities including stroke who denies urinary symptoms at baseline, but during recent hospitalization for chest pain was found to have elevated PVRs reportedly 400 to 500 mL, and a Foley catheter was replaced.  CT shows a 54 g prostate, distended bladder, no hydronephrosis.  He was started on tamsulosin prior to discharge.  Keflex was given for prophylaxis, and Foley catheter was removed today.  He returned this afternoon and  has been able to urinate, PVR normal at 94 mL.  Return precautions were discussed extensively.  I refilled the Flomax to continue this, and recommended close follow-up in 4 months for repeat PVR.  An additional Keflex was given to take tonight with his history of catheterization   Nickolas Madrid, MD 09/23/2021  [facility] [address], [place], [place] [address] [phone]

## 2021-09-23 NOTE — Telephone Encounter (Signed)
Transition Care Management Unsuccessful Follow-up Telephone Call  Date of discharge and from where:  09/22/2021 from [facility]  Attempts:  1st Attempt  Reason for unsuccessful TCM follow-up call:  Left voice message

## 2021-09-24 NOTE — Telephone Encounter (Signed)
Transition Care Management Unsuccessful Follow-up Telephone Call  Date of discharge and from where:  09/22/2021 from [facility]  Attempts:  2nd Attempt  Reason for unsuccessful TCM follow-up call:  Left voice message

## 2021-09-30 ENCOUNTER — Other Ambulatory Visit: Payer: Self-pay | Admitting: Internal Medicine

## 2021-10-01 NOTE — Telephone Encounter (Signed)
Requested Prescriptions  Pending Prescriptions Disp Refills   melatonin 1 MG TABS tablet [Pharmacy Med Name: MELATONIN 1 MG TAB] 30 tablet 1    Sig: TAKE 1 TABLET BY MOUTH AT BEDTIME     Over the Counter:  OTC Passed - 09/30/2021  4:30 PM      Passed - Valid encounter within last 12 months    Recent Outpatient Visits          1 month ago Diabetes mellitus without complication (HCC)   [facility] Vigg, Avanti, MD   2 months ago Controlled type 2 diabetes mellitus with complication, without long-term current use of insulin (HCC)   [facility] Vigg, Avanti, MD   3 months ago Diabetic ulcer of toe of left foot associated with type 2 diabetes mellitus, limited to breakdown of skin (HCC)   [facility] Vigg, Avanti, MD   3 months ago Need for pneumococcal vaccination   [facility] Vigg, Avanti, MD   4 months ago Need for influenza vaccination   [facility] Vigg, Avanti, MD      Future Appointments            In 1 month Vigg, Avanti, MD [facility]

## 2021-10-07 ENCOUNTER — Ambulatory Visit (INDEPENDENT_AMBULATORY_CARE_PROVIDER_SITE_OTHER): Payer: Medicaid Other | Admitting: Vascular Surgery

## 2021-10-09 ENCOUNTER — Telehealth: Payer: Self-pay | Admitting: Urology

## 2021-10-09 NOTE — Telephone Encounter (Signed)
Patients caretaker Rise Paganini called and stated that Mr Evan Townsend is complaining of pain at the penis and burning since his catheter was removed.  She is asking if this is normal or if its possible that he has a UTI.

## 2021-10-10 NOTE — Telephone Encounter (Signed)
Called pt's wife, she states that pt is having some burning with urination since having catheter removed. Pt denies, fever, chills, flank pain ect. Advised pt to start AZO for burning. Monitor symptoms over the weekend, call back on Monday for reassessment or seek care in ED if symptoms worsen. Pt's wife gave verbal understanding.

## 2021-10-13 ENCOUNTER — Encounter (HOSPITAL_COMMUNITY): Payer: Self-pay | Admitting: Radiology

## 2021-10-16 ENCOUNTER — Encounter: Payer: Self-pay | Admitting: Internal Medicine

## 2021-10-16 ENCOUNTER — Other Ambulatory Visit: Payer: Self-pay

## 2021-10-16 ENCOUNTER — Ambulatory Visit: Payer: Medicaid Other | Admitting: Internal Medicine

## 2021-10-16 ENCOUNTER — Emergency Department: Payer: Medicaid Other

## 2021-10-16 ENCOUNTER — Inpatient Hospital Stay
Admission: EM | Admit: 2021-10-16 | Discharge: 2021-10-18 | DRG: 309 | Disposition: A | Discharge status: Home Health | Payer: Medicaid Other | Attending: Internal Medicine | Admitting: Internal Medicine

## 2021-10-16 ENCOUNTER — Encounter (INDEPENDENT_AMBULATORY_CARE_PROVIDER_SITE_OTHER): Payer: Medicaid Other | Admitting: Vascular Surgery

## 2021-10-16 VITALS — BP 111/64 | HR 35 | Ht 64.41 in | Wt 145.4 lb

## 2021-10-16 DIAGNOSIS — Z20822 Contact with and (suspected) exposure to covid-19: Secondary | ICD-10-CM | POA: Diagnosis present

## 2021-10-16 DIAGNOSIS — N179 Acute kidney failure, unspecified: Secondary | ICD-10-CM | POA: Diagnosis not present

## 2021-10-16 DIAGNOSIS — R9431 Abnormal electrocardiogram [ECG] [EKG]: Secondary | ICD-10-CM | POA: Diagnosis not present

## 2021-10-16 DIAGNOSIS — R001 Bradycardia, unspecified: Principal | ICD-10-CM | POA: Diagnosis present

## 2021-10-16 DIAGNOSIS — Z87891 Personal history of nicotine dependence: Secondary | ICD-10-CM | POA: Diagnosis not present

## 2021-10-16 DIAGNOSIS — Z7984 Long term (current) use of oral hypoglycemic drugs: Secondary | ICD-10-CM

## 2021-10-16 DIAGNOSIS — I2510 Atherosclerotic heart disease of native coronary artery without angina pectoris: Secondary | ICD-10-CM | POA: Diagnosis present

## 2021-10-16 DIAGNOSIS — R079 Chest pain, unspecified: Secondary | ICD-10-CM

## 2021-10-16 DIAGNOSIS — Z79899 Other long term (current) drug therapy: Secondary | ICD-10-CM

## 2021-10-16 DIAGNOSIS — D631 Anemia in chronic kidney disease: Secondary | ICD-10-CM | POA: Diagnosis not present

## 2021-10-16 DIAGNOSIS — D649 Anemia, unspecified: Secondary | ICD-10-CM | POA: Diagnosis not present

## 2021-10-16 DIAGNOSIS — E785 Hyperlipidemia, unspecified: Secondary | ICD-10-CM

## 2021-10-16 DIAGNOSIS — Z833 Family history of diabetes mellitus: Secondary | ICD-10-CM

## 2021-10-16 DIAGNOSIS — I69354 Hemiplegia and hemiparesis following cerebral infarction affecting left non-dominant side: Secondary | ICD-10-CM | POA: Diagnosis not present

## 2021-10-16 DIAGNOSIS — E1169 Type 2 diabetes mellitus with other specified complication: Secondary | ICD-10-CM | POA: Diagnosis present

## 2021-10-16 DIAGNOSIS — Z8673 Personal history of transient ischemic attack (TIA), and cerebral infarction without residual deficits: Secondary | ICD-10-CM | POA: Diagnosis not present

## 2021-10-16 DIAGNOSIS — R197 Diarrhea, unspecified: Secondary | ICD-10-CM | POA: Diagnosis not present

## 2021-10-16 DIAGNOSIS — D638 Anemia in other chronic diseases classified elsewhere: Secondary | ICD-10-CM | POA: Diagnosis not present

## 2021-10-16 DIAGNOSIS — I639 Cerebral infarction, unspecified: Secondary | ICD-10-CM | POA: Diagnosis not present

## 2021-10-16 DIAGNOSIS — E7800 Pure hypercholesterolemia, unspecified: Secondary | ICD-10-CM | POA: Diagnosis not present

## 2021-10-16 DIAGNOSIS — A419 Sepsis, unspecified organism: Secondary | ICD-10-CM | POA: Diagnosis not present

## 2021-10-16 DIAGNOSIS — E1122 Type 2 diabetes mellitus with diabetic chronic kidney disease: Secondary | ICD-10-CM | POA: Diagnosis not present

## 2021-10-16 DIAGNOSIS — I440 Atrioventricular block, first degree: Secondary | ICD-10-CM | POA: Diagnosis not present

## 2021-10-16 DIAGNOSIS — N189 Chronic kidney disease, unspecified: Secondary | ICD-10-CM

## 2021-10-16 DIAGNOSIS — I4891 Unspecified atrial fibrillation: Secondary | ICD-10-CM

## 2021-10-16 DIAGNOSIS — Z794 Long term (current) use of insulin: Secondary | ICD-10-CM

## 2021-10-16 DIAGNOSIS — Z560 Unemployment, unspecified: Secondary | ICD-10-CM

## 2021-10-16 DIAGNOSIS — K219 Gastro-esophageal reflux disease without esophagitis: Secondary | ICD-10-CM | POA: Diagnosis present

## 2021-10-16 DIAGNOSIS — I480 Paroxysmal atrial fibrillation: Secondary | ICD-10-CM | POA: Diagnosis present

## 2021-10-16 DIAGNOSIS — I129 Hypertensive chronic kidney disease with stage 1 through stage 4 chronic kidney disease, or unspecified chronic kidney disease: Secondary | ICD-10-CM | POA: Diagnosis not present

## 2021-10-16 DIAGNOSIS — I455 Other specified heart block: Secondary | ICD-10-CM

## 2021-10-16 DIAGNOSIS — N182 Chronic kidney disease, stage 2 (mild): Secondary | ICD-10-CM | POA: Diagnosis present

## 2021-10-16 DIAGNOSIS — J189 Pneumonia, unspecified organism: Secondary | ICD-10-CM | POA: Diagnosis not present

## 2021-10-16 DIAGNOSIS — Z8249 Family history of ischemic heart disease and other diseases of the circulatory system: Secondary | ICD-10-CM

## 2021-10-16 DIAGNOSIS — E1151 Type 2 diabetes mellitus with diabetic peripheral angiopathy without gangrene: Secondary | ICD-10-CM | POA: Diagnosis not present

## 2021-10-16 DIAGNOSIS — Z823 Family history of stroke: Secondary | ICD-10-CM | POA: Diagnosis not present

## 2021-10-16 DIAGNOSIS — R42 Dizziness and giddiness: Secondary | ICD-10-CM | POA: Diagnosis not present

## 2021-10-16 DIAGNOSIS — Z7901 Long term (current) use of anticoagulants: Secondary | ICD-10-CM | POA: Diagnosis not present

## 2021-10-16 DIAGNOSIS — I10 Essential (primary) hypertension: Secondary | ICD-10-CM | POA: Diagnosis present

## 2021-10-16 DIAGNOSIS — E1159 Type 2 diabetes mellitus with other circulatory complications: Secondary | ICD-10-CM

## 2021-10-16 DIAGNOSIS — E1165 Type 2 diabetes mellitus with hyperglycemia: Secondary | ICD-10-CM

## 2021-10-16 DIAGNOSIS — I482 Chronic atrial fibrillation, unspecified: Secondary | ICD-10-CM | POA: Diagnosis not present

## 2021-10-16 LAB — CBC
HCT: 31 % — ABNORMAL LOW (ref 39.0–52.0)
Hemoglobin: 9.6 g/dL — ABNORMAL LOW (ref 13.0–17.0)
MCH: 27.6 pg (ref 26.0–34.0)
MCHC: 31 g/dL (ref 30.0–36.0)
MCV: 89.1 fL (ref 80.0–100.0)
Platelets: 205 10*3/uL (ref 150–400)
RBC: 3.48 MIL/uL — ABNORMAL LOW (ref 4.22–5.81)
RDW: 17.2 % — ABNORMAL HIGH (ref 11.5–15.5)
WBC: 4.8 10*3/uL (ref 4.0–10.5)
nRBC: 0.6 % — ABNORMAL HIGH (ref 0.0–0.2)

## 2021-10-16 LAB — GASTROINTESTINAL PANEL BY PCR, STOOL (REPLACES STOOL CULTURE)

## 2021-10-16 LAB — RESP PANEL BY RT-PCR (FLU A&B, COVID) ARPGX2
Influenza A by PCR: NEGATIVE
Influenza B by PCR: NEGATIVE
SARS Coronavirus 2 by RT PCR: NEGATIVE

## 2021-10-16 LAB — CBG MONITORING, ED: Glucose-Capillary: 154 mg/dL — ABNORMAL HIGH (ref 70–99)

## 2021-10-16 LAB — BASIC METABOLIC PANEL
Anion gap: 7 (ref 5–15)
BUN: 31 mg/dL — ABNORMAL HIGH (ref 8–23)
CO2: 21 mmol/L — ABNORMAL LOW (ref 22–32)
Calcium: 8.9 mg/dL (ref 8.9–10.3)
Chloride: 113 mmol/L — ABNORMAL HIGH (ref 98–111)
Creatinine, Ser: 1.4 mg/dL — ABNORMAL HIGH (ref 0.61–1.24)
GFR, Estimated: 56 mL/min — ABNORMAL LOW (ref >60)
Glucose, Bld: 104 mg/dL — ABNORMAL HIGH (ref 70–99)
Potassium: 3.9 mmol/L (ref 3.5–5.1)
Sodium: 141 mmol/L (ref 135–145)

## 2021-10-16 LAB — C DIFFICILE QUICK SCREEN W PCR REFLEX
C Diff antigen: NEGATIVE
C Diff interpretation: NOT DETECTED
C Diff toxin: NEGATIVE

## 2021-10-16 LAB — HIV ANTIBODY (ROUTINE TESTING W REFLEX): HIV Screen 4th Generation wRfx: NONREACTIVE

## 2021-10-16 LAB — T4, FREE: Free T4: 1.09 ng/dL (ref 0.61–1.12)

## 2021-10-16 LAB — MAGNESIUM: Magnesium: 2.1 mg/dL (ref 1.7–2.4)

## 2021-10-16 LAB — TSH: TSH: 0.867 u[IU]/mL (ref 0.350–4.500)

## 2021-10-16 LAB — TROPONIN I (HIGH SENSITIVITY)
Troponin I (High Sensitivity): 6 ng/L (ref <18)
Troponin I (High Sensitivity): 5 ng/L (ref <18)

## 2021-10-16 LAB — APTT: aPTT: 38 seconds — ABNORMAL HIGH (ref 24–36)

## 2021-10-16 MED ORDER — NITROGLYCERIN 0.4 MG SL SUBL: 0.4000 mg | SUBLINGUAL_TABLET | SUBLINGUAL | Status: DC | PRN

## 2021-10-16 MED ORDER — ALBUTEROL SULFATE (2.5 MG/3ML) 0.083% IN NEBU: 2.5000 mg | INHALATION_SOLUTION | Freq: Four times a day (QID) | RESPIRATORY_TRACT | Status: DC | PRN

## 2021-10-16 MED ORDER — ACETAMINOPHEN 650 MG RE SUPP: 650.0000 mg | Freq: Four times a day (QID) | RECTAL | Status: DC | PRN

## 2021-10-16 MED ORDER — INSULIN ASPART 100 UNIT/ML IJ SOLN
0.0000 [IU] | Freq: Three times a day (TID) | INTRAMUSCULAR | Status: DC
  Administered 2021-10-17: 1 [IU] via SUBCUTANEOUS
  Filled 2021-10-16: qty 1

## 2021-10-16 MED ORDER — PANTOPRAZOLE SODIUM 40 MG PO TBEC
40.0000 mg | DELAYED_RELEASE_TABLET | Freq: Two times a day (BID) | ORAL | Status: DC
Start: 2021-10-16 — End: 2021-10-18
  Administered 2021-10-16 – 2021-10-18 (×4): 40 mg via ORAL
  Filled 2021-10-16 (×4): qty 1

## 2021-10-16 MED ORDER — ONDANSETRON HCL 4 MG/2ML IJ SOLN: 4.0000 mg | Freq: Four times a day (QID) | INTRAMUSCULAR | Status: DC | PRN

## 2021-10-16 MED ORDER — ACETAMINOPHEN 325 MG PO TABS: 650.0000 mg | ORAL_TABLET | Freq: Four times a day (QID) | ORAL | Status: DC | PRN

## 2021-10-16 MED ORDER — INSULIN ASPART 100 UNIT/ML IJ SOLN
0.0000 [IU] | Freq: Every day | INTRAMUSCULAR | Status: DC
  Filled 2021-10-16: qty 1

## 2021-10-16 MED ORDER — BACLOFEN 10 MG PO TABS
10.0000 mg | ORAL_TABLET | Freq: Three times a day (TID) | ORAL | Status: DC
  Administered 2021-10-16 – 2021-10-18 (×5): 10 mg via ORAL
  Filled 2021-10-16 (×8): qty 1

## 2021-10-16 MED ORDER — CALCIUM GLUCONATE-NACL 1-0.675 GM/50ML-% IV SOLN
1.0000 g | Freq: Once | INTRAVENOUS | Status: AC
  Administered 2021-10-16: 1000 mg via INTRAVENOUS
  Filled 2021-10-16: qty 50

## 2021-10-16 MED ORDER — ATORVASTATIN CALCIUM 80 MG PO TABS
80.0000 mg | ORAL_TABLET | Freq: Every day | ORAL | Status: DC
  Administered 2021-10-17 – 2021-10-18 (×2): 80 mg via ORAL
  Filled 2021-10-16 (×2): qty 1

## 2021-10-16 MED ORDER — SODIUM CHLORIDE 0.9 % IV SOLN: INTRAVENOUS | Status: DC

## 2021-10-16 MED ORDER — LORATADINE 10 MG PO TABS
10.0000 mg | ORAL_TABLET | Freq: Every day | ORAL | Status: DC
  Administered 2021-10-17 – 2021-10-18 (×2): 10 mg via ORAL
  Filled 2021-10-16 (×2): qty 1

## 2021-10-16 MED ORDER — ALBUTEROL SULFATE HFA 108 (90 BASE) MCG/ACT IN AERS: 2.0000 | INHALATION_SPRAY | Freq: Four times a day (QID) | RESPIRATORY_TRACT | Status: DC | PRN

## 2021-10-16 MED ORDER — MELATONIN 3 MG PO TABS
1.5000 mg | ORAL_TABLET | Freq: Every day | ORAL | Status: DC
Start: 2021-10-16 — End: 2021-10-17
  Filled 2021-10-16 (×2): qty 0.5

## 2021-10-16 MED ORDER — GLUCAGON HCL RDNA (DIAGNOSTIC) 1 MG IJ SOLR
1.0000 mg | Freq: Once | INTRAMUSCULAR | Status: AC
  Administered 2021-10-16: 1 mg via INTRAVENOUS
  Filled 2021-10-16: qty 1

## 2021-10-16 MED ORDER — METOCLOPRAMIDE HCL 10 MG PO TABS
5.0000 mg | ORAL_TABLET | Freq: Three times a day (TID) | ORAL | Status: DC
  Administered 2021-10-17 – 2021-10-18 (×5): 5 mg via ORAL
  Filled 2021-10-16 (×5): qty 1

## 2021-10-16 MED ORDER — TAMSULOSIN HCL 0.4 MG PO CAPS
0.4000 mg | ORAL_CAPSULE | Freq: Every day | ORAL | Status: DC
  Administered 2021-10-17 – 2021-10-18 (×2): 0.4 mg via ORAL
  Filled 2021-10-16 (×2): qty 1

## 2021-10-16 MED ORDER — ONDANSETRON HCL 4 MG PO TABS: 4.0000 mg | ORAL_TABLET | Freq: Four times a day (QID) | ORAL | Status: DC | PRN

## 2021-10-16 MED ORDER — APIXABAN 5 MG PO TABS: 5.0000 mg | ORAL_TABLET | Freq: Two times a day (BID) | ORAL | Status: DC

## 2021-10-16 NOTE — Assessment & Plan Note (Addendum)
Initially antihypertensive medications were held.  Upon discharge can go back on Norvasc. ?

## 2021-10-16 NOTE — Assessment & Plan Note (Addendum)
Came in with heart rates in the 30s and 40s.  Gave glucagon and calcium on admission to reverse beta-blocker.  Discontinue Toprol-XL.  The patient did have a sinus pause of 2.62 seconds.  Case discussed with cardiology and okay to discharge home.  No need for pacemaker. ?

## 2021-10-16 NOTE — Assessment & Plan Note (Addendum)
Resolved.  Stool studies negative.  Discontinue metformin for now ?

## 2021-10-16 NOTE — Assessment & Plan Note (Addendum)
With paroxysmal atrial fibrillation cardiology recommended going back on Eliquis which was prescribed upon discharge. ?

## 2021-10-16 NOTE — ED Provider Notes (Signed)
? ?[facility] ?Provider Note ? ? ? Event Date/Time  ? First MD Initiated Contact with Patient 10/16/21 1518   ?  (approximate) ? ? ?History  ? ?Bradycardia ? ? ?HPI ? ?Evan MCCOURY Sr. is a 66 y.o. male diabetes, paroxysmal A-fib, coronary artery disease, hemiplegia ? ?Patient referred from PCP.  He went to a routine visit and was found to be bradycardic. ? ?Both he and his wife report he has not noticed any symptoms with his wife since her last roughly 2 weeks has just been very tired later around the house much more than typical and seems to have a low energy level. ? ?No chest pain no trouble breathing no fever.  His Foley catheter has been removed.  Reports he is not having any difficulty urinating has been able to urinate and eat normally ? ?Reports he just feels like he is low energy for at least 2 weeks ?  ? ? ?Physical Exam  ? ?Triage Vital Signs: ?ED Triage Vitals  ?Enc Vitals Group  ?   BP 10/16/21 1502 104/63  ?   Pulse Rate 10/16/21 1502 (!) 37  ?   Resp 10/16/21 1502 16  ?   Temp --   ?   Temp src --   ?   SpO2 10/16/21 1502 100 %  ?   Weight 10/16/21 1457 145 lb 8.1 oz (66 kg)  ?   Height 10/16/21 1457 5\' 4"  (1.626 m)  ?   Head Circumference --   ?   Peak Flow --   ?   Pain Score 10/16/21 1457 0  ?   Pain Loc --   ?   Pain Edu? --   ?   Excl. in [place]? --   ? ? ?Most recent vital signs: ?Vitals:  ? 10/16/21 1502 10/16/21 1511  ?BP: 104/63 129/75  ?Pulse: (!) 37 (!) 38  ?Resp: 16 16  ?SpO2: 100% 100%  ? ? ? ?General: Awake, no distress.  ?CV:  Good peripheral perfusion.  Bradycardia, heart rate 35-38.  Appears to be sinus ?Resp:  Normal effort.  Clear lung sounds bilateral ?Abd:  No distention.  ? ? ? ?ED Results / Procedures / Treatments  ? ?Labs ?(all labs ordered are listed, but only abnormal results are displayed) ?Labs Reviewed  ?BASIC METABOLIC PANEL - Abnormal; Notable for the following components:  ?    Result Value  ? Chloride 113 (*)   ? CO2 21 (*)   ? Glucose, Bld 104  (*)   ? BUN 31 (*)   ? Creatinine, Ser 1.40 (*)   ? GFR, Estimated 56 (*)   ? All other components within normal limits  ?CBC - Abnormal; Notable for the following components:  ? RBC 3.48 (*)   ? Hemoglobin 9.6 (*)   ? HCT 31.0 (*)   ? RDW 17.2 (*)   ? nRBC 0.6 (*)   ? All other components within normal limits  ?RESP PANEL BY RT-PCR (FLU A&B, COVID) ARPGX2  ?C DIFFICILE QUICK SCREEN W PCR REFLEX    ?GASTROINTESTINAL PANEL BY PCR, STOOL (REPLACES STOOL CULTURE)  ?TSH  ?T4, FREE  ?MAGNESIUM  ?HIV ANTIBODY (ROUTINE TESTING W REFLEX)  ?HEMOGLOBIN A1C  ?TROPONIN I (HIGH SENSITIVITY)  ?TROPONIN I (HIGH SENSITIVITY)  ? ? ? ?EKG ? ?Reviewed and interpreted by me at 1500 ?Heart rate 35 ?QRS 140 ?QTc 440 ?Sinus bradycardia first-degree AV block.  Marked bradycardia.  No significant ST segment  abnormalities, I somewhat unusual upsloping QRS complexes observed in multiple leads ? ?Compared with previous EKG, patient's morphology appears similar in previous appearance, but markedly more bradycardic ? ? ?RADIOLOGY ?Patient's chest x-ray is viewed and interpreted by me, negative for acute finding ? ? ? ?PROCEDURES: ? ?Critical Care performed: No ? ?Procedures ? ? ?MEDICATIONS ORDERED IN ED: ?Medications  ?calcium gluconate 1 g/ 50 mL sodium chloride IVPB (1,000 mg Intravenous New Bag/Given 10/16/21 1715)  ?atorvastatin (LIPITOR) tablet 80 mg (has no administration in time range)  ?nitroGLYCERIN (NITROSTAT) SL tablet 0.4 mg (has no administration in time range)  ?metoCLOPramide (REGLAN) tablet 5 mg (has no administration in time range)  ?pantoprazole (PROTONIX) EC tablet 40 mg (has no administration in time range)  ?tamsulosin (FLOMAX) capsule 0.4 mg (has no administration in time range)  ?apixaban (ELIQUIS) tablet 5 mg (has no administration in time range)  ?melatonin tablet 1 mg (has no administration in time range)  ?baclofen (LIORESAL) tablet 10 mg (has no administration in time range)  ?albuterol (VENTOLIN HFA) 108 (90 Base)  MCG/ACT inhaler 2 puff (has no administration in time range)  ?loratadine (CLARITIN) tablet 10 mg (has no administration in time range)  ?acetaminophen (TYLENOL) tablet 650 mg (has no administration in time range)  ?  Or  ?acetaminophen (TYLENOL) suppository 650 mg (has no administration in time range)  ?ondansetron (ZOFRAN) tablet 4 mg (has no administration in time range)  ?  Or  ?ondansetron (ZOFRAN) injection 4 mg (has no administration in time range)  ?insulin aspart (novoLOG) injection 0-9 Units (has no administration in time range)  ?insulin aspart (novoLOG) injection 0-5 Units (has no administration in time range)  ?glucagon (human recombinant) (GLUCAGEN) injection 1 mg (1 mg Intravenous Given 10/16/21 1715)  ? ? ? ?IMPRESSION / MDM / ASSESSMENT AND PLAN / ED COURSE  ?I reviewed the triage vital signs and the nursing notes. ?             ?               ? ?Differential diagnosis includes, but is not limited to, acute metabolic or medication related derangements.  Unlikely to represent ACS symptoms of weakness for over 1 week's time no chest pain no trouble breathing.  No infectious symptoms.  febrile.  Differential diagnosis is broad, currently awaiting metabolic panel and further testing.  Personally interpreted patient's labs including CBC which shows a mild slightly downtrend in hemoglobin, but generally chronic anemia ? ?Does not presently appear to be in need of immediate cardiac pacing or procedure in the emergency department.  He is alert oriented, normotensive and seems to be tolerating this bradycardia well.  Review of his home medications on my review briefly I do not see clear medication that would cause this sinus bradycardia.  He is not on digoxin ? ?I have placed consultation with [facility] cardiology at 3:35 PM to Dr. Mariah Milling, who reviewed and also added Peggyann Juba, PA to consult request. ? ? ?The patient is on the cardiac monitor to evaluate for evidence of arrhythmia and/or significant heart rate  changes. ? ?Admission discussed for consultation with Dr. Hilton Sinclair, and upon discussion patient will be admitted to the hospitalist service with cardiology consult ? ?----------------------------------------- ?5:51 PM on 10/16/2021 ?----------------------------------------- ?Personally discussed the patient with Dr. Mariah Milling of cardiology in the emergency department, Dr. Mariah Milling is currently seeing the patient in consultation at the bedside ? ?FINAL CLINICAL IMPRESSION(S) / ED DIAGNOSES  ? ?Final diagnoses:  ?Symptomatic sinus  bradycardia  ? ? ? ?Rx / DC Orders  ? ?ED Discharge Orders   ? ? None  ? ?  ? ? ? ?Note:  This document was prepared using Dragon voice recognition software and may include unintentional dictation errors. ?  Sharyn Creamer, MD ?10/16/21 1819 ? ?

## 2021-10-16 NOTE — Assessment & Plan Note (Addendum)
Creatinine was 1.4 on presentation went up to 1.53 and upon discharge down to 1.28.  Check a BMP as outpatient.  Continue Flomax for previous urinary retention. ?

## 2021-10-16 NOTE — Consult Note (Signed)
Cardiology Consultation:   Patient ID: Evan HUBBERT Sr. MRN: [id number]; DOB: 04-14-1956  Admit date: 10/16/2021 Date of Consult: 10/16/2021  PCP:  Evan Cousins, Townsend   [place] Providers Cardiologist:  Evan Bush, Townsend   { Physician requesting consult: Evan Townsend Reason for consult: Marked sinus bradycardia, symptomatic   Patient Profile:   Evan RISDEN Sr. is a 66 y.o. male with a hx of paroxysmal atrial fibrillation, type 2 diabetes, recurrent strokes, PAD, smoker,  History of Present Illness:   Evan Townsend presented today from primary care where he was noticed to have heart rate in the 30s He reported he was asymptomatic apart from fatigue over the past week In the hospital end of January 2023 left-sided chest pain Was on metoprolol succinate 25 mg daily on admission Dose was increased up to metoprolol succinate 50 mg daily at discharge Medication was held by Evan Townsend March 2 at 230 7 in the afternoon  Blood pressure stable in clinic and on triage in the emergency room He is a poor historian, most of the history obtained by patient's wife who is the caregiver He denies shortness of breath, no chest pain just low energy, fatigue  Lab work notable for hemoglobin 9.6, creatinine 1.4 Normal TSH    Past Medical History:  Diagnosis Date   Carotid arterial disease ([place])    a. 08/2018 Carotid U/S: min-mod RICA atherosclerosis w/o hemodynamically significant stenosis. Nl LICA.   Diabetes 1.5, managed as type 2 ([place])    Diastolic dysfunction    a. 08/2018 Echo: EF 65%. No rwma. Gr1 DD. Mild MR.   Diastolic dysfunction    a. 08/2019 Echo: EF 55-60%, Gr1 DD. No rwma. Mild MR. RVSP 37.13mHg.   Hypercholesterolemia    Hypertension    PAF (paroxysmal atrial fibrillation) ([place]    a. 10/2019 Event Monitor: PAF; b. CHA2DS2VASc = 5-->Evan Townsend.   Poorly controlled diabetes mellitus ([place]    a. 04/2019 A1c 13.8.   Recurrent strokes ([place]    a. 10/2016 MRI/A: Acute 5942mR  thalamic infarct, ? subacute infarct of Townsend corona radiata; b. 08/2017 MRI/A: Acute 42m71materal L thalamic infarct. Other more remote lacunar infarcts of thalami bilat. Small vessel dzs; c. 08/2018 MRI/A: Acute lacunar infarct of the post limb of Townsend internal capsule; d. 08/2019 MRI Acute CVA of L paramedian pons adn Townsend cerebellar hemisphere.   Tobacco abuse     Past Surgical History:  Procedure Laterality Date   ESOPHAGOGASTRODUODENOSCOPY (EGD) WITH PROPOFOL N/A 04/26/2019   Procedure: ESOPHAGOGASTRODUODENOSCOPY (EGD) WITH PROPOFOL;  Surgeon: Evan Townsend;  Location: [facility] ENDOSCOPY;  Service: Gastroenterology;  Laterality: N/A;   NO PAST SURGERIES       Home Medications:  Prior to Admission medications   Medication Sig Start Date End Date Taking? Authorizing Provider  ACCU-CHEK GUIDE test strip Check fsbs once daily 12/24/20   SowSteele SizerD  Accu-Chek Softclix Lancets lancets SMARTSIG:Topical 12/24/20   SowSteele SizerD  albuterol (VENTOLIN HFA) 108 (90 Base) MCG/ACT inhaler Inhale 2 puffs into the lungs every 6 (six) hours as needed for wheezing or shortness of breath. 02/26/21   Evan Townsend  amLODipine (NORVASC) 10 MG tablet Take 1 tablet (10 mg total) by mouth daily. 06/27/21 09/25/21  Vigg, Evan Townsend  apixaban (Evan Townsend) 5 MG TABS tablet Take 1 tablet (5 mg total) by mouth 2 (two) times daily. 03/20/21   Evan Townsend  atorvastatin (LIPITOR) 80 MG tablet Take 1 tablet (80 mg  total) by mouth daily. 03/20/21   Evan Townsend  baclofen (LIORESAL) 10 MG tablet Take 1 tablet (10 mg total) by mouth 3 (three) times daily. 06/20/21   Evan Townsend  Blood Glucose Monitoring Suppl (FIFTY50 GLUCOSE METER 2.0) w/Device KIT Use as instructed 05/05/18   Provider, Historical, Townsend  Blood Glucose Monitoring Suppl KIT Accucheck Guide with lancets and test strips #100 with one refill.  Test blood sugar twice daily. DX Code E11.65 ; Z79.4 07/09/20   Evan Townsend  Continuous Blood  Gluc Sensor (FREESTYLE LIBRE 2 SENSOR) MISC 1 each by Does not apply route every 14 (fourteen) days. 06/20/21   Evan Townsend  dapagliflozin propanediol (FARXIGA) 10 MG TABS tablet Take 1 tablet (10 mg total) by mouth daily before breakfast. In place of glipizide 03/20/21   Evan Townsend  fexofenadine (ALLEGRA ALLERGY) 180 MG tablet Take 1 tablet (180 mg total) by mouth daily. 07/15/21   Evan Cousins, Townsend  Incontinence Supply Disposable (COMFORT PROTECT ADULT DIAPER/L) MISC 1 Units by Does not apply route daily. Use 3-4 a day as needed 06/20/21   Evan Cousins, Townsend  Insulin Pen Needle 32G X 6 MM MISC Daily 10/24/20   Evan Townsend  irbesartan (AVAPRO) 300 MG tablet Take 1 tablet (300 mg total) by mouth daily. 09/17/21   Evan Reeve, DO  ketoconazole (NIZORAL) 2 % cream Apply 1 application topically daily. 07/12/21   Evan Townsend, Evan Townsend  melatonin 1 MG TABS tablet TAKE 1 TABLET BY MOUTH AT BEDTIME 10/01/21   Evan Townsend  metFORMIN (GLUCOPHAGE) 500 MG tablet Take 1 tablet (500 mg total) by mouth 2 (two) times daily with a meal. 07/15/21   Evan Townsend  metoCLOPramide (REGLAN) 5 MG tablet TAKE 1 TABLET BY MOUTH 3 TIMES DAILY BEFORE MEALS 08/05/21   Evan Townsend  Misc. Devices MISC One pair of Compression stockings  Hemiplegia and hemiparesis following cerebral infarction affecting left non-dominant side ([place])  - Primary Codes: Z61.096 Lower extremity edema  Codes: R60.0 Type 2 diabetes mellitus with hyperglycemia, with long-term current use of insulin ([place])  Codes: E11.65, Z79.4 2020-06-11   Evan Townsend  nitroGLYCERIN (NITROSTAT) 0.4 MG SL tablet Place 1 tablet (0.4 mg total) under the tongue every 5 (five) minutes as needed for chest pain. 09/16/21   Evan Reeve, DO  omeprazole (PRILOSEC) 40 MG capsule TAKE 1 CAPSULE BY MOUTH IN THE MORNING AND AT BEDTIME 08/05/21   Evan Townsend  SENNA PLUS 8.6-50 MG tablet TAKE 2 TABLETS BY MOUTH 2 TIMES DAILY  08/06/21   Evan Townsend  sitaGLIPtin (JANUVIA) 100 MG tablet Take 1 tablet (100 mg total) by mouth daily. 06/20/21   Evan Townsend  tamsulosin (FLOMAX) 0.4 MG CAPS capsule Take 1 capsule (0.4 mg total) by mouth daily. 09/23/21   [place], Townsend    Inpatient Medications: Scheduled Meds:  [START ON 10/17/2021] atorvastatin  80 mg Oral Daily   baclofen  10 mg Oral TID   insulin aspart  0-5 Units Subcutaneous QHS   [START ON 10/17/2021] insulin aspart  0-9 Units Subcutaneous TID WC   loratadine  10 mg Oral Daily   melatonin  1 mg Oral QHS   metoCLOPramide  5 mg Oral TID AC   pantoprazole  40 mg Oral BID   tamsulosin  0.4 mg Oral Daily   Continuous Infusions:  sodium chloride     PRN Meds: acetaminophen **OR**  acetaminophen, albuterol, nitroGLYCERIN, ondansetron **OR** ondansetron (ZOFRAN) IV  Allergies:   No Known Allergies  Social History:   Social History   Socioeconomic History   Marital status: Married    Spouse name: Presenter, broadcasting   Number of children: 5   Years of education: Not on file   Highest education level: Not on file  Occupational History   Occupation: unemployed  Tobacco Use   Smoking status: Former    Packs/day: 1.00    Years: 38.00    Pack years: 38.00    Types: Cigarettes    Start date: 1982   Smokeless tobacco: Never  Vaping Use   Vaping Use: Never used  Substance and Sexual Activity   Alcohol use: Not Currently    Alcohol/week: 1.0 standard drink    Types: 1 Cans of beer per week    Comment: previously drank but nothing in 1-2 yrs (08/2019).   Drug use: Not Currently    Types: Cocaine, Marijuana    Comment: prev used cocaine/marijuana but none x 1-2 yrs (08/2019).   Sexual activity: Yes  Other Topics Concern   Not on file  Social History Narrative   Lives in [place] with his wife.  Uses a cane when ambulating.  Can walk about 25 yds prior to having to rest - says he stumbles if he has to work further than that.  Usually uses a  scooter to get around stores.   Social Determinants of Health   Financial Resource Strain: Not on file  Food Insecurity: Not on file  Transportation Needs: Not on file  Physical Activity: Not on file  Stress: Not on file  Social Connections: Not on file  Intimate Partner Violence: Not on file    Family History:    Family History  Problem Relation Age of Onset   Hypertension Mother    Stroke Mother        died @ age 67   Hypertension Father    Diabetes Father    Heart attack Father        died @ 21     ROS:  Please see the history of present illness.  Review of Systems  Constitutional: Negative.   HENT: Negative.    Respiratory: Negative.    Cardiovascular: Negative.   Gastrointestinal: Negative.   Musculoskeletal: Negative.   Neurological: Negative.   Psychiatric/Behavioral: Negative.    All other systems reviewed and are negative.  Physical Exam/Data:   Vitals:   10/16/21 1457 10/16/21 1502 10/16/21 1511  BP:  104/63 129/75  Pulse:  (!) 37 (!) 38  Resp:  16 16  SpO2:  100% 100%  Weight: 66 kg    Height: 5' 4"  (1.626 m)     No intake or output data in the 24 hours ending 10/16/21 1846 Last 3 Weights 10/16/2021 10/16/2021 09/23/2021  Weight (lbs) 145 lb 8.1 oz 145 lb 6.4 oz 145 lb  Weight (kg) 66 kg 65.953 kg 65.772 kg     Body mass index is 24.98 kg/m.  General:  Well nourished, well developed, in no acute distress HEENT: normal Neck: no JVD Vascular: No carotid bruits; Distal pulses 2+ bilaterally Cardiac:  normal S1, S2; RRR; no murmur  Lungs:  clear to auscultation bilaterally, no wheezing, rhonchi or rales  Abd: soft, nontender, no hepatomegaly  Ext: no edema Musculoskeletal:  No deformities, BUE and BLE strength normal and equal Skin: warm and dry  Neuro:  CNs 2-12 intact, no focal abnormalities noted Psych:  Normal affect   EKG:  The EKG was personally reviewed and demonstrates:   Marked sinus bradycardia rate 35 bpm right bundle branch  block  Telemetry:  Telemetry was personally reviewed and demonstrates:   Sinus bradycardia rate in the 30s  Relevant CV Studies:  Echocardiogram pending  Laboratory Data:  High Sensitivity Troponin:   Recent Labs  Lab 10/16/21 1512  TROPONINIHS 6     Chemistry Recent Labs  Lab 10/16/21 1512  NA 141  K 3.9  CL 113*  CO2 21*  GLUCOSE 104*  BUN 31*  CREATININE 1.40*  CALCIUM 8.9  MG 2.1  GFRNONAA 56*  ANIONGAP 7    No results for input(s): PROT, ALBUMIN, AST, ALT, ALKPHOS, BILITOT in the last 168 hours. Lipids No results for input(s): CHOL, TRIG, HDL, LABVLDL, LDLCALC, CHOLHDL in the last 168 hours.  Hematology Recent Labs  Lab 10/16/21 1512  WBC 4.8  RBC 3.48*  HGB 9.6*  HCT 31.0*  MCV 89.1  MCH 27.6  MCHC 31.0  RDW 17.2*  PLT 205   Thyroid  Recent Labs  Lab 10/16/21 1512  TSH 0.867  FREET4 1.09    BNPNo results for input(s): BNP, PROBNP in the last 168 hours.  DDimer No results for input(s): DDIMER in the last 168 hours.   Radiology/Studies:  DG Chest Port 1 View  Result Date: 10/16/2021 CLINICAL DATA:  cp EXAM: PORTABLE CHEST 1 VIEW COMPARISON:  Chest x-ray 09/14/2021 FINDINGS: The heart and mediastinal contours are unchanged. Left base streaky airspace opacity likely representing atelectasis. No focal consolidation. No pulmonary edema. No pleural effusion. No pneumothorax. No acute osseous abnormality. IMPRESSION: No active disease. Electronically Signed   By: Iven Finn M.D.   On: 10/16/2021 15:22     Assessment and Plan:   Symptomatic bradycardia Marked sinus bradycardia rate 35 bpm Has been taking metoprolol succinate 50 mg, dose was increased from 25 at the end of January 2023 Appears relatively acute over the past week or 2 with increasing fatigue, general malaise Main caretaker has noticed his increase in lethargy for at least 1 week --Hemodynamically stable despite heart rates in the mid 30s -Consider atropine at the  bedside -Dopamine infusion for hemodynamic instability -Given he has been taking metoprolol succinate, (will confirm with wife), need a washout through the weekend.  If he remains with marked bradycardia, symptomatic, may qualify for pacemaker -We will transition Evan Townsend to heparin infusion  Diarrhea  recent antibiotic use, Presenting with bradycardia, malaise C. difficile pending  Recurrent strokes We will transition Evan Townsend to heparin in case pacemaker needed on Monday if no improvement in heart rate/rhythm  Acute renal failure Likely secondary to low output failure from bradycardia Cautious use of IV fluids to avoid pulmonary edema  Essential hypertension Medications on hold for now Avoid beta-blockers, calcium channel blocker such as diltiazem/verapamil  Type 2 diabetes with complications On sliding scale    Total encounter time more than 110 minutes  Greater than 50% was spent in counseling and coordination of care with the patient  For questions or updates, please contact [place] Please consult [email or web address] for contact info under    Signed, Ida Rogue, Townsend  10/16/2021 6:46 PM

## 2021-10-16 NOTE — Progress Notes (Signed)
? ?BP 111/64   Pulse (!) 35   Ht 5' 4.41" (1.636 m)   Wt 145 lb 6.4 oz (66 kg)   SpO2 97%   BMI 24.64 kg/m?   ? ?Subjective:  ? ? Patient ID: Evan GANGEMI Sr., male    DOB: Aug 24, 1955, 66 y.o.   MRN: [id number] ? ?Chief Complaint  ?Patient presents with  ? Hospitalization Follow-up  ?  Patient was in hospital twice since last visit on 09/14/21 and 09/22/21, released on 09/17/21.  ? Referral  ?  For Physical and Occp Therapy  ? Diabetes  ?  Patient is have uncontrolled diarrhea since re starting Metformin, wife would like to have this med d/d'd and placed on a different on.   ? ? ?HPI: ?Evan TALAVERA Sr. is a 65 y.o. male ?09/14/2021 - chest pain with an elevated bp  torponins were -ve  ?Pt was seen x 3 in hosp ?1st time was admitted for a couple days ?2 times for dysuria and cath related cx. ? ?Diabetes ?He presents for his follow-up diabetic visit. He has type 2 diabetes mellitus.  ? ?Chief Complaint  ?Patient presents with  ? Hospitalization Follow-up  ?  Patient was in hospital twice since last visit on 09/14/21 and 09/22/21, released on 09/17/21.  ? Referral  ?  For Physical and Occp Therapy  ? Diabetes  ?  Patient is have uncontrolled diarrhea since re starting Metformin, wife would like to have this med d/d'd and placed on a different on.   ? ? ?Relevant past medical, surgical, family and social history reviewed and updated as indicated. Interim medical history since our last visit reviewed. ?Allergies and medications reviewed and updated. ? ?Review of Systems ? ?Per HPI unless specifically indicated above ? ?   ?Objective:  ?  ?BP 111/64   Pulse (!) 35   Ht 5' 4.41" (1.636 m)   Wt 145 lb 6.4 oz (66 kg)   SpO2 97%   BMI 24.64 kg/m?   ?Wt Readings from Last 3 Encounters:  ?10/16/21 145 lb 8.1 oz (66 kg)  ?10/16/21 145 lb 6.4 oz (66 kg)  ?09/23/21 145 lb (65.8 kg)  ?  ?Physical Exam ?Vitals and nursing note reviewed.  ?Constitutional:   ?   General: He is not in acute distress. ?   Appearance: Normal  appearance. He is not ill-appearing or diaphoretic.  ?Cardiovascular:  ?   Rate and Rhythm: Regular rhythm. Bradycardia present.  ?   Heart sounds: No murmur heard. ?  No gallop.  ?Pulmonary:  ?   Effort: No respiratory distress.  ?   Breath sounds: No stridor. No wheezing or rhonchi.  ?Chest:  ?   Chest wall: No tenderness.  ?Abdominal:  ?   Tenderness: There is no abdominal tenderness.  ?Musculoskeletal:  ?   Cervical back: Normal range of motion and neck supple. No rigidity or tenderness.  ?   Left lower leg: No edema.  ?Neurological:  ?   Mental Status: He is alert.  ? ?Results for orders placed or performed in visit on 09/23/21  ?BLADDER SCAN AMB NON-IMAGING  ?Result Value Ref Range  ? Scan Result 48m   ? ?   ? ? ?Current Outpatient Medications:  ?  ACCU-CHEK GUIDE test strip, Check fsbs once daily, Disp: 100 each, Rfl: 2 ?  Accu-Chek Softclix Lancets lancets, SMARTSIG:Topical, Disp: 100 each, Rfl: 1 ?  albuterol (VENTOLIN HFA) 108 (90 Base) MCG/ACT inhaler, Inhale 2 puffs into  the lungs every 6 (six) hours as needed for wheezing or shortness of breath., Disp: 8 g, Rfl: 1 ?  amLODipine (NORVASC) 10 MG tablet, Take 1 tablet (10 mg total) by mouth daily., Disp: 90 tablet, Rfl: 1 ?  apixaban (ELIQUIS) 5 MG TABS tablet, Take 1 tablet (5 mg total) by mouth 2 (two) times daily., Disp: 60 tablet, Rfl: 0 ?  atorvastatin (LIPITOR) 80 MG tablet, Take 1 tablet (80 mg total) by mouth daily., Disp: 90 tablet, Rfl: 1 ?  baclofen (LIORESAL) 10 MG tablet, Take 1 tablet (10 mg total) by mouth 3 (three) times daily., Disp: 90 each, Rfl: 1 ?  Blood Glucose Monitoring Suppl (FIFTY50 GLUCOSE METER 2.0) w/Device KIT, Use as instructed, Disp: , Rfl:  ?  Blood Glucose Monitoring Suppl KIT, Accucheck Guide with lancets and test strips #100 with one refill.  Test blood sugar twice daily. DX Code E11.65 ; Z79.4, Disp: 1 kit, Rfl: 0 ?  Continuous Blood Gluc Sensor (FREESTYLE LIBRE 2 SENSOR) MISC, 1 each by Does not apply route every 14  (fourteen) days., Disp: 6 each, Rfl: 1 ?  dapagliflozin propanediol (FARXIGA) 10 MG TABS tablet, Take 1 tablet (10 mg total) by mouth daily before breakfast. In place of glipizide, Disp: 30 tablet, Rfl: 0 ?  fexofenadine (ALLEGRA ALLERGY) 180 MG tablet, Take 1 tablet (180 mg total) by mouth daily., Disp: 10 tablet, Rfl: 1 ?  Incontinence Supply Disposable (COMFORT PROTECT ADULT DIAPER/L) MISC, 1 Units by Does not apply route daily. Use 3-4 a day as needed, Disp: 90 each, Rfl: 3 ?  Insulin Pen Needle 32G X 6 MM MISC, Daily, Disp: 100 each, Rfl: 3 ?  ketoconazole (NIZORAL) 2 % cream, Apply 1 application topically daily., Disp: 60 g, Rfl: 2 ?  melatonin 1 MG TABS tablet, TAKE 1 TABLET BY MOUTH AT BEDTIME, Disp: 30 tablet, Rfl: 1 ?  metoCLOPramide (REGLAN) 5 MG tablet, TAKE 1 TABLET BY MOUTH 3 TIMES DAILY BEFORE MEALS, Disp: 90 tablet, Rfl: 0 ?  Misc. Devices MISC, One pair of Compression stockings  Hemiplegia and hemiparesis following cerebral infarction affecting left non-dominant side (H[facility])  - Primary Codes: J18.841 Lower extremity edema  Codes: R60.0 Type 2 diabetes mellitus with hyperglycemia, with long-term current use of insulin ([facility])  Codes: E11.65, Z79.4, Disp: 1 Units, Rfl: 0 ?  nitroGLYCERIN (NITROSTAT) 0.4 MG SL tablet, Place 1 tablet (0.4 mg total) under the tongue every 5 (five) minutes as needed for chest pain., Disp: 30 tablet, Rfl: 0 ?  omeprazole (PRILOSEC) 40 MG capsule, TAKE 1 CAPSULE BY MOUTH IN THE MORNING AND AT BEDTIME, Disp: 180 capsule, Rfl: 1 ?  tamsulosin (FLOMAX) 0.4 MG CAPS capsule, Take 1 capsule (0.4 mg total) by mouth daily., Disp: 30 capsule, Rfl: 0  ? ? ?Assessment & Plan:  ? ?CAD : Will need to see cards  has been lost to follow up  ? ?2. CVA : is seeing neurology - More weak than before took longer to ge there.  ? ?3. Bradycardia will need to go to the ER has a PR of 30 and EKG shows BBB  ?Pts wife is unaware of his medications and is not comprehending the urgency of the situation  she agreed to take him to the ER however took a while to explain the severeity and urgency of what was going on.  ?Pt has been in the [facility] 3 times since the beginning of the year and is only coming in now for  a hopsital and ER follow up unsure why. ? ? ? ? ?Problem List Items Addressed This Visit   ? ?  ? Other  ? Bradycardia - Primary  ? Relevant Orders  ? EKG 12-Lead (Completed)  ? Ambulatory referral to Cardiology  ?  ? ?Orders Placed This Encounter  ?Procedures  ? Ambulatory referral to Cardiology  ? EKG 12-Lead  ?  ? ?No orders of the defined types were placed in this encounter. ?  ? ?Follow up plan: ?No follow-ups on file. ? ?

## 2021-10-16 NOTE — Assessment & Plan Note (Signed)
Continue PPI ?

## 2021-10-16 NOTE — H&P (Signed)
History and Physical    Patient: Evan Townsend TML:[id number] DOB: 24-Jun-1956 DOA: 10/16/2021 DOS: the patient was seen and examined on 10/16/2021 PCP: Charlynne Cousins, MD  Patient coming from: Home  Chief Complaint:  Chief Complaint  Patient presents with   Bradycardia    HPI: Evan DRESCH Sr. is a 66 y.o. male with medical history significant of recurrent strokes, paroxysmal atrial fibrillation, type 2 diabetes mellitus, peripheral vascular disease, tobacco abuse.  He has been having diarrhea for 3 to 4 days about 3-4 episodes a day.  He was on antibiotics previously when he had urinary retention.  The patient saw the medical team and referred to the hospital secondary to bradycardia with heart rate in the 30s.  Told to stop taking Toprol-XL.  In the ER, the patient was found to have a creatinine that was elevated at 1.4.  Patient's heart rate was in the 30s and 40s when I saw him.  Patient was also having diarrhea.  Review of Systems: Review of Systems  Constitutional:  Positive for malaise/fatigue. Negative for fever.  HENT:  Positive for congestion. Negative for sore throat.   Eyes:  Positive for blurred vision.  Respiratory:  Negative for cough and shortness of breath.   Cardiovascular:  Negative for chest pain.  Gastrointestinal:  Positive for diarrhea. Negative for abdominal pain, nausea and vomiting.  Genitourinary:  Positive for dysuria.  Musculoskeletal:  Positive for myalgias.  Neurological:  Positive for focal weakness.  Endo/Heme/Allergies:  Does not bruise/bleed easily.  Psychiatric/Behavioral:  Negative for depression.    Past Medical History:  Diagnosis Date   Carotid arterial disease (Evan Townsend)    a. 08/2018 Carotid U/S: min-mod RICA atherosclerosis w/o hemodynamically significant stenosis. Nl LICA.   Diabetes 1.5, managed as type 2 ([place])    Diastolic dysfunction    a. 08/2018 Echo: EF 65%. No rwma. Gr1 DD. Mild MR.   Diastolic dysfunction    a. 08/2019 Echo: EF  55-60%, Gr1 DD. No rwma. Mild MR. RVSP 37.51mHg.   Hypercholesterolemia    Hypertension    PAF (paroxysmal atrial fibrillation) (Evan Townsend    a. 10/2019 Event Monitor: PAF; b. CHA2DS2VASc = 5-->Eliquis.   Poorly controlled diabetes mellitus (Evan Townsend    a. 04/2019 A1c 13.8.   Recurrent strokes ([place]    a. 10/2016 MRI/A: Acute 517mR thalamic infarct, ? subacute infarct of R corona radiata; b. 08/2017 MRI/A: Acute 64m49materal L thalamic infarct. Other more remote lacunar infarcts of thalami bilat. Small vessel dzs; c. 08/2018 MRI/A: Acute lacunar infarct of the post limb of R internal capsule; d. 08/2019 MRI Acute CVA of L paramedian pons adn R cerebellar hemisphere.   Tobacco abuse    Past Surgical History:  Procedure Laterality Date   ESOPHAGOGASTRODUODENOSCOPY (EGD) WITH PROPOFOL N/A 04/26/2019   Procedure: ESOPHAGOGASTRODUODENOSCOPY (EGD) WITH PROPOFOL;  Surgeon: VanLin LandsmanD;  Location: [facility];  Service: Gastroenterology;  Laterality: N/A;   NO PAST SURGERIES     Social History:  reports that he has quit smoking. His smoking use included cigarettes. He started smoking about 41 years ago. He has a 38.00 pack-year smoking history. He has never used smokeless tobacco. He reports that he does not currently use alcohol after a past usage of about 1.0 standard drink per week. He reports that he does not currently use drugs after having used the following drugs: Cocaine and Marijuana.  No Known Allergies  Family History  Problem Relation Age of Onset   Hypertension  Mother    Stroke Mother        died @ age 23   Hypertension Father    Diabetes Father    Heart attack Father        died @ 78    Prior to Admission medications   Medication Sig Start Date End Date Taking? Authorizing Provider  ACCU-CHEK GUIDE test strip Check fsbs once daily 12/24/20   Steele Sizer, MD  Accu-Chek Softclix Lancets lancets SMARTSIG:Topical 12/24/20   Steele Sizer, MD  albuterol (VENTOLIN HFA) 108 (90  Base) MCG/ACT inhaler Inhale 2 puffs into the lungs every 6 (six) hours as needed for wheezing or shortness of breath. 02/26/21   Vigg, Avanti, MD  amLODipine (NORVASC) 10 MG tablet Take 1 tablet (10 mg total) by mouth daily. 06/27/21 09/25/21  Vigg, Loman Brooklyn, MD  apixaban (ELIQUIS) 5 MG TABS tablet Take 1 tablet (5 mg total) by mouth 2 (two) times daily. 03/20/21   Vigg, Avanti, MD  atorvastatin (LIPITOR) 80 MG tablet Take 1 tablet (80 mg total) by mouth daily. 03/20/21   Vigg, Avanti, MD  baclofen (LIORESAL) 10 MG tablet Take 1 tablet (10 mg total) by mouth 3 (three) times daily. 06/20/21   Vigg, Avanti, MD  Blood Glucose Monitoring Suppl (FIFTY50 GLUCOSE METER 2.0) w/Device KIT Use as instructed 05/05/18   [provider]  Blood Glucose Monitoring Suppl KIT Accucheck Guide with lancets and test strips #100 with one refill.  Test blood sugar twice daily. DX Code E11.65 ; Z79.4 07/09/20   Towanda Malkin, MD  Continuous Blood Gluc Sensor (FREESTYLE LIBRE 2 SENSOR) MISC 1 each by Does not apply route every 14 (fourteen) days. 06/20/21   Vigg, Avanti, MD  dapagliflozin propanediol (FARXIGA) 10 MG TABS tablet Take 1 tablet (10 mg total) by mouth daily before breakfast. In place of glipizide 03/20/21   Vigg, Avanti, MD  fexofenadine (ALLEGRA ALLERGY) 180 MG tablet Take 1 tablet (180 mg total) by mouth daily. 07/15/21   Charlynne Cousins, MD  Incontinence Supply Disposable (COMFORT PROTECT ADULT DIAPER/L) MISC 1 Units by Does not apply route daily. Use 3-4 a day as needed 06/20/21   Charlynne Cousins, MD  Insulin Pen Needle 32G X 6 MM MISC Daily 10/24/20   Steele Sizer, MD  irbesartan (AVAPRO) 300 MG tablet Take 1 tablet (300 mg total) by mouth daily. 09/17/21   Emeterio Reeve, DO  ketoconazole (NIZORAL) 2 % cream Apply 1 application topically daily. 07/12/21   McDonald, Adam R, DPM  melatonin 1 MG TABS tablet TAKE 1 TABLET BY MOUTH AT BEDTIME 10/01/21   Vigg, Avanti, MD  metFORMIN (GLUCOPHAGE) 500 MG tablet  Take 1 tablet (500 mg total) by mouth 2 (two) times daily with a meal. 07/15/21   Vigg, Avanti, MD  metoCLOPramide (REGLAN) 5 MG tablet TAKE 1 TABLET BY MOUTH 3 TIMES DAILY BEFORE MEALS 08/05/21   McElwee, Scheryl Darter, NP  Misc. Devices MISC One pair of Compression stockings  Hemiplegia and hemiparesis following cerebral infarction affecting left non-dominant side ([place])  - Primary Codes: U98.119 Lower extremity edema  Codes: R60.0 Type 2 diabetes mellitus with hyperglycemia, with long-term current use of insulin ([place])  Codes: E11.65, Z79.4 05/24/20   Towanda Malkin, MD  nitroGLYCERIN (NITROSTAT) 0.4 MG SL tablet Place 1 tablet (0.4 mg total) under the tongue every 5 (five) minutes as needed for chest pain. 09/16/21   Emeterio Reeve, DO  omeprazole (PRILOSEC) 40 MG capsule TAKE 1 CAPSULE BY MOUTH IN THE MORNING  AND AT BEDTIME 08/05/21   Vigg, Avanti, MD  SENNA PLUS 8.6-50 MG tablet TAKE 2 TABLETS BY MOUTH 2 TIMES DAILY 08/06/21   Cannady, Jolene T, NP  sitaGLIPtin (JANUVIA) 100 MG tablet Take 1 tablet (100 mg total) by mouth daily. 06/20/21   Vigg, Avanti, MD  tamsulosin (FLOMAX) 0.4 MG CAPS capsule Take 1 capsule (0.4 mg total) by mouth daily. 09/23/21   Billey Co, MD    Physical Exam: Vitals:   10/16/21 1457 10/16/21 1502 10/16/21 1511  BP:  104/63 129/75  Pulse:  (!) 37 (!) 38  Resp:  16 16  SpO2:  100% 100%  Weight: 66 kg    Height: 5' 4"  (1.626 m)     Physical Exam HENT:     Head: Normocephalic.     Mouth/Throat:     Pharynx: No oropharyngeal exudate.  Eyes:     General: Lids are normal.     Conjunctiva/sclera: Conjunctivae normal.  Cardiovascular:     Rate and Rhythm: Normal rate and regular rhythm.     Heart sounds: Normal heart sounds, S1 normal and S2 normal.  Pulmonary:     Breath sounds: No decreased breath sounds, wheezing, rhonchi or rales.  Abdominal:     Palpations: Abdomen is soft.     Tenderness: There is no abdominal tenderness.   Musculoskeletal:     Right lower leg: Swelling present.     Left lower leg: Swelling present.  Neurological:     Mental Status: He is alert.     Comments: Answers questions slowly.  Chronic left-sided weakness.  Able to straight leg raise bilaterally little harder to do with the left leg.     Data Reviewed: Laboratory data and radiological data reviewed by me.  Creatinine 1.4.  Hemoglobin 9.6.  EKG interpreted by me shows marked sinus bradycardia with first-degree AV block at 34 bpm   Assessment and Plan: * Bradycardia Heart rate in the 30s and 40s when I saw him.  In speaking with the patient's wife he was taking Toprol XL 50 mg daily.  He was told not to take this anymore with a low heart rate today.  I will reverse beta-blocker with glucagon IV and calcium IV.  Monitor on progressive care.  Cardiology consultation.  Diarrhea With recent antibiotics use we will send stool for C. difficile and stool comprehensive panel.  Hold off on antibiotics at this point until stool studies resulted.  Recurrent strokes (Evan Townsend) Patient on Eliquis for paroxysmal atrial fibrillation.  Left-sided weakness.  Physical therapy consult once heart rate better.  Acute kidney injury superimposed on CKD (Evan Townsend) Gentle IV fluid hydration for creatinine of 1.4.  Previous creatinine 1.11.  Previous urinary retention on Flomax.  Continue to monitor urine output  Essential hypertension Hold antihypertensive medications at this time secondary to bradycardia.  Continue to monitor closely.  Type 2 diabetes mellitus with hyperlipidemia (Evan Townsend) Hold oral medications and put on sliding scale for right now.  Continue atorvastatin.  Gastroesophageal reflux disease Continue PPI   Advance Care Planning:   Code Status: Full Code   Consults: Cardiology  Family Communication: Spoke with wife at the bedside  Severity of Illness: The appropriate patient status for this patient is INPATIENT. Inpatient status is judged to  be reasonable and necessary in order to provide the required intensity of service to ensure the patient's safety. The patient's presenting symptoms, physical exam findings, and initial radiographic and laboratory data in the context of their chronic comorbidities  is felt to place them at high risk for further clinical deterioration. Furthermore, it is not anticipated that the patient will be medically stable for discharge from the hospital within 2 midnights of admission.   * I certify that at the point of admission it is my clinical judgment that the patient will require inpatient hospital care spanning beyond 2 midnights from the point of admission due to high intensity of service, high risk for further deterioration and high frequency of surveillance required.*  Author: Loletha Grayer, MD 10/16/2021 5:02 PM  For on call review [email or web address].

## 2021-10-16 NOTE — ED Triage Notes (Signed)
Pt here with bradycardia today. Pt was at his PCP when staff noticed his heart rate was in the 30s. Pt denies pain. Pt stable in triage. ?

## 2021-10-16 NOTE — Assessment & Plan Note (Addendum)
We held oral medications upon coming into the hospital.  Can go back on Farxiga as outpatient.  Hold metformin because of diarrhea.  Continue atorvastatin. ?

## 2021-10-17 ENCOUNTER — Inpatient Hospital Stay (HOSPITAL_COMMUNITY)
Admit: 2021-10-17 | Discharge: 2021-10-17 | Disposition: A | Discharge status: Home / Self Care | Payer: Medicaid Other | Attending: Cardiovascular Disease | Admitting: Cardiovascular Disease

## 2021-10-17 DIAGNOSIS — R9431 Abnormal electrocardiogram [ECG] [EKG]: Secondary | ICD-10-CM | POA: Diagnosis not present

## 2021-10-17 DIAGNOSIS — D649 Anemia, unspecified: Secondary | ICD-10-CM

## 2021-10-17 LAB — ECHOCARDIOGRAM COMPLETE
AR max vel: 1.85 cm2
AV Area VTI: 1.89 cm2
AV Area mean vel: 1.94 cm2
AV Mean grad: 3 mmHg
AV Peak grad: 5.6 mmHg
Ao pk vel: 1.18 m/s
Area-P 1/2: 2.83 cm2
Height: 64 in
MV VTI: 1.55 cm2
S' Lateral: 3.45 cm
Weight: 2328.06 oz

## 2021-10-17 LAB — GLUCOSE, CAPILLARY
Glucose-Capillary: 117 mg/dL — ABNORMAL HIGH (ref 70–99)
Glucose-Capillary: 123 mg/dL — ABNORMAL HIGH (ref 70–99)
Glucose-Capillary: 131 mg/dL — ABNORMAL HIGH (ref 70–99)
Glucose-Capillary: 146 mg/dL — ABNORMAL HIGH (ref 70–99)
Glucose-Capillary: 83 mg/dL (ref 70–99)

## 2021-10-17 LAB — BASIC METABOLIC PANEL
Anion gap: 5 (ref 5–15)
BUN: 28 mg/dL — ABNORMAL HIGH (ref 8–23)
CO2: 20 mmol/L — ABNORMAL LOW (ref 22–32)
Calcium: 8.6 mg/dL — ABNORMAL LOW (ref 8.9–10.3)
Chloride: 117 mmol/L — ABNORMAL HIGH (ref 98–111)
Creatinine, Ser: 1.53 mg/dL — ABNORMAL HIGH (ref 0.61–1.24)
GFR, Estimated: 50 mL/min — ABNORMAL LOW (ref >60)
Glucose, Bld: 122 mg/dL — ABNORMAL HIGH (ref 70–99)
Potassium: 3.7 mmol/L (ref 3.5–5.1)
Sodium: 142 mmol/L (ref 135–145)

## 2021-10-17 LAB — CBC
HCT: 29.3 % — ABNORMAL LOW (ref 39.0–52.0)
Hemoglobin: 9.5 g/dL — ABNORMAL LOW (ref 13.0–17.0)
MCH: 28.4 pg (ref 26.0–34.0)
MCHC: 32.4 g/dL (ref 30.0–36.0)
MCV: 87.5 fL (ref 80.0–100.0)
Platelets: 210 10*3/uL (ref 150–400)
RBC: 3.35 MIL/uL — ABNORMAL LOW (ref 4.22–5.81)
RDW: 17.4 % — ABNORMAL HIGH (ref 11.5–15.5)
WBC: 4.5 10*3/uL (ref 4.0–10.5)
nRBC: 0 % (ref 0.0–0.2)

## 2021-10-17 LAB — FERRITIN: Ferritin: 335 ng/mL (ref 24–336)

## 2021-10-17 MED ORDER — LOPERAMIDE HCL 2 MG PO CAPS
4.0000 mg | ORAL_CAPSULE | Freq: Three times a day (TID) | ORAL | Status: DC | PRN
  Administered 2021-10-17 – 2021-10-18 (×2): 4 mg via ORAL
  Filled 2021-10-17 (×2): qty 2

## 2021-10-17 MED ORDER — ENOXAPARIN SODIUM 40 MG/0.4ML IJ SOSY
40.0000 mg | PREFILLED_SYRINGE | INTRAMUSCULAR | Status: DC
  Administered 2021-10-17: 40 mg via SUBCUTANEOUS
  Filled 2021-10-17: qty 0.4

## 2021-10-17 MED ORDER — MELATONIN 5 MG PO TABS
2.5000 mg | ORAL_TABLET | Freq: Every day | ORAL | Status: DC
  Administered 2021-10-17: 2.5 mg via ORAL
  Filled 2021-10-17: qty 1

## 2021-10-17 NOTE — Progress Notes (Signed)
Patient's temp 91.5/rectally upon admission to the floor; bair hugger requested and applied. HR low 30's. Denies any symptoms except being cold.  ? ?VS monitored closely. Wife at bedside. ? ? ? ?

## 2021-10-17 NOTE — Progress Notes (Addendum)
?   10/17/21 0031  ?Assess: MEWS Score  ?Temp (!) 91.8 ?F (33.2 ?C) ?(bear hugger)  ?BP (!) 141/79  ?Pulse Rate (!) 36  ?ECG Heart Rate (!) 38  ?Resp 15  ?Level of Consciousness Alert  ?SpO2 100 %  ?O2 Device Room Air  ?Assess: MEWS Score  ?MEWS Temp 2  ?MEWS Systolic 0  ?MEWS Pulse 2  ?MEWS RR 0  ?MEWS LOC 0  ?MEWS Score 4  ?MEWS Score Color Red  ?Assess: if the MEWS score is Yellow or Red  ?Were vital signs taken at a resting state? Yes  ?Focused Assessment No change from prior assessment  ?Does the patient meet 2 or more of the SIRS criteria? No  ?MEWS guidelines implemented *See Row Information* Yes  ?Treat  ?Pain Scale 0-10  ?Pain Score 0  ?Take Vital Signs  ?Increase Vital Sign Frequency  Red: Q 1hr X 4 then Q 4hr X 4, if remains red, continue Q 4hrs  ?Notify: Charge Nurse/RN  ?Name of Charge Nurse/RN Notified Tawanna Cooler RN  ?Date Charge Nurse/RN Notified 10/17/21  ?Time Charge Nurse/RN Notified 0032  ?Notify: Provider  ?Provider Name/Title Manuela Schwartz  ?Date Provider Notified 10/17/21  ?Time Provider Notified [phone]  ?Notification Type Page ?(secure chat)  ?Notification Reason Other (Comment) ?(Temp, HR)  ?Provider response Other (Comment) ?(cont to monitor; already on bair hugger)  ?Date of Provider Response 10/17/21  ?Time of Provider Response [phone]  ?Assess: SIRS CRITERIA  ?SIRS Temperature  1  ?SIRS Pulse 0  ?SIRS Respirations  0  ?SIRS WBC 0  ?SIRS Score Sum  1  ? ? ?

## 2021-10-17 NOTE — Progress Notes (Signed)
?  Transition of Care (TOC) Screening Note ? ? ?Patient Details  ?Name: Evan MONTELONGO Sr. ?Date of Birth: 10-15-1955 ? ? ?Transition of Care (TOC) CM/SW Contact:    ?Alberteen Sam, LCSW ?Phone Number: ?10/17/2021, 8:38 AM ? ? ? ?Transition of Care Department [facility]) has reviewed patient and no TOC needs have been identified at this time. We will continue to monitor patient advancement through interdisciplinary progression rounds. If new patient transition needs arise, please place a TOC consult. ? Pricilla Riffle, [place] ?[phone] ? ?

## 2021-10-17 NOTE — Progress Notes (Signed)
*  PRELIMINARY RESULTS* ?Echocardiogram ?2D Echocardiogram has been performed. ? ?Evan Townsend M Evan Townsend ?10/17/2021, 1:50 PM ?

## 2021-10-17 NOTE — Progress Notes (Incomplete)
Attending Note Patient seen and examined, agree with detailed note above,   Patient presentation and plan discussed on rounds.    EKG lab work, chest x-ray, echocardiogram reviewed independently by myself  Feels better, though did not sleep well,  No complaints, eating breakfast Tele reviewed: heart rate 65 to 70, rate improved from 1 Am to this AM  On examination : alert  no JVD, lungs clear to auscultation bilaterally, heart sounds regular normal S1-S2 no murmurs appreciated, abdomen soft nontender no significant lower extremity edema.  Musculoskeletal exam with good range of motion, neurologic exam grossly nonfocal (full neuro exam not performed)  Labs reviewed:  Sodium 141, potassium 3.9, CR 1.4 BUN 31 HGB 9.8  A/P: Symptomatic bradycardia Marked sinus bradycardia rate 35 bpm on arrival, was taking metoprolol succinate 50 daily dose was increased from 25 at the end of January 2023 Rate 60-70, back to his baseline   Diarrhea  recent antibiotic use, Presenting with bradycardia, malaise C. difficile pending Management per medicine service   Recurrent strokes Continue Eliquis   Acute renal failure Possibly exacerbated in the setting of marked bradycardia Avoid NSAIDs   Essential hypertension On amlodipine 10, Avapro 300 Avoid beta-blocker   Type 2 diabetes with complications On sliding scale    Greater than 50% was spent in counseling and coordination of care with patient Total encounter time 40 minutes or more   Signed: Dossie Arbour  M.D., Ph.D. [facility]

## 2021-10-17 NOTE — Progress Notes (Signed)
?  Progress Note ? ? ?Patient: Evan BADLEY Sr. MGN:[id number] DOB: 09/22/1955 DOA: 10/16/2021     1 ?DOS: the patient was seen and examined on 10/17/2021 ?  ? ? ?Assessment and Plan: ?* Acute kidney injury superimposed on CKD (H[facility]) ?Gentle IV fluid hydration for creatinine of 1.53 today.  Creatinine 1.4 on presentation.  Previous creatinine 1.11.  Previous urinary retention on Flomax.  Continue to monitor urine output.  Likely this was poor renal perfusion with bradycardia. ? ?Bradycardia ?Came in with heart rates in the 30s and 40s.  Gave glucagon and calcium yesterday to reverse beta-blocker.  And Toprol-XL at home. Heart rate today in the 60s.  Unlikely to need a pacemaker. ? ?Diarrhea ?Resolved.  Stool studies negative. ? ?Recurrent strokes ([facility]) ?I was called yesterday that the patient was not taking Eliquis.  The patient's wife confirmed that the patient was not taking Eliquis.  We will decide on anticoagulation upon discharge home. ? ?Essential hypertension ?Currently holding antihypertensive medication ? ?Type 2 diabetes mellitus with hyperlipidemia ([facility]) ?Hold oral medications and put on sliding scale for right now.  Continue atorvastatin. ? ?Gastroesophageal reflux disease ?Continue PPI ? ?Anemia, unspecified ?Add on a ferritin to morning labs.  IV fluids contributing to the patient's anemia.  Hemoglobin 9.5 today. ? ? ? ? ? ? ? ?Subjective: Patient feeling a little bit better today.  No further diarrhea.  Heart rate in the 60s.  Sent in with symptomatic bradycardia and diarrhea.  Also found to have acute kidney injury. ? ?Physical Exam: ?Vitals:  ? 10/17/21 0600 10/17/21 0754 10/17/21 1209 10/17/21 1440  ?BP:  (!) 146/77 (!) 151/82 130/69  ?Pulse:  63 60 (!) 56  ?Resp:  17 18 16   ?Temp: 98 ?F (36.7 ?C) 97.6 ?F (36.4 ?C) (!) 97.4 ?F (36.3 ?C) (!) 97.3 ?F (36.3 ?C)  ?TempSrc:      ?SpO2:  97% 99% 99%  ?Weight:      ?Height:      ? ?Physical Exam ?HENT:  ?   Head: Normocephalic.  ?   Mouth/Throat:  ?   Pharynx:  No oropharyngeal exudate.  ?Eyes:  ?   General: Lids are normal.  ?   Conjunctiva/sclera: Conjunctivae normal.  ?Cardiovascular:  ?   Rate and Rhythm: Normal rate and regular rhythm.  ?   Heart sounds: Normal heart sounds, S1 normal and S2 normal.  ?Pulmonary:  ?   Breath sounds: No decreased breath sounds, wheezing, rhonchi or rales.  ?Abdominal:  ?   Palpations: Abdomen is soft.  ?   Tenderness: There is no abdominal tenderness.  ?Musculoskeletal:  ?   Right lower leg: Swelling present.  ?   Left lower leg: Swelling present.  ?Neurological:  ?   Mental Status: He is alert.  ?   Comments: Chronic left-sided weakness.  Able to straight leg raise bilaterally little harder to do with the left leg.  ?  ? ?Data Reviewed: ?Stool studies negative, creatinine 1.53 today, hemoglobin 9.5 ? ?Family Communication: Spoke with patient's wife on the phone ? ?Disposition: ?Status is: Inpatient ?Remains inpatient appropriate because: Creatinine worse today than yesterday.  Would like to see creatinine peak and come down.  Hopefully creatinine will be better tomorrow and will be able to be discharged. ? ?Planned Discharge Destination: Home with Home Health ? ?Author: ? , MD ?10/17/2021 3:55 PM ? ?For on call review [email or web address].  ? ?

## 2021-10-17 NOTE — Progress Notes (Signed)
Patient stated "I am feeling better". Temp 97.2 ax; HR now at 50's.  ?Provider updated.  ?

## 2021-10-17 NOTE — Evaluation (Signed)
Physical Therapy Evaluation ?Patient Details ?Name: Evan CALK Sr. ?MRN: [id number] ?DOB: 1956-05-08 ?Today's Date: 10/17/2021 ? ?History of Present Illness ? Evan SEE Sr. is a 66 y.o. male with medical history significant of recurrent strokes, paroxysmal atrial fibrillation, type 2 diabetes mellitus, peripheral vascular disease, tobacco abuse. ? ?  ?Clinical Impression ? Pt received in Semi-Fowler's position and agreeable to therapy.  Pt A&O x4, with slurred speech throughout.  Pt reports he usually utilizes a Lofstrand crutch in the RUE for support when ambulating around the home.  During questioning, spouse was called to verify housing situation, as her information was on the board and requested information.  Pt then participated in mobility, with heavy reliance on the RUE due to increased weakness of the L side from prior strokes.  Pt utilized bed-rails for mobility, however did so in a safe, effective way.  Pt then transferred to standing utilizing the bed-rail and the Lofstrand crutch for support.  Pt able to ambulate safely with CGA, 120 ft before becoming fatigued and returning to the bed.  Pt then left with all needs met, nursing notified of pt's stability with walking and requesting for purewick to be discontinued due to pt's ability to ambulate to the bathroom with SBA from staff.  Current discharge plans to home with HHPT are appropriate at this time in order to assist pt in returning to PLOF.  Pt will continue to benefit from skilled therapy in order to address deficits listed below. ? ?   ? ?Recommendations for follow up therapy are one component of a multi-disciplinary discharge planning process, led by the attending physician.  Recommendations may be updated based on patient status, additional functional criteria and insurance authorization. ? ?Follow Up Recommendations Home health PT ? ?  ?Assistance Recommended at Discharge PRN  ?Patient can return home with the following ?   ? ?   ?Equipment Recommendations None recommended by PT  ?Recommendations for Other Services ?    ?  ?Functional Status Assessment Patient has had a recent decline in their functional status and demonstrates the ability to make significant improvements in function in a reasonable and predictable amount of time.  ? ?  ?Precautions / Restrictions Restrictions ?Weight Bearing Restrictions: No  ? ?  ? ?Mobility ? Bed Mobility ?Overal bed mobility: Needs Assistance ?Bed Mobility: Supine to Sit ?  ?  ?Supine to sit: HOB elevated ?  ?  ?General bed mobility comments: RUE use on handrail for coming upright due to L sided weakness. ?  ? ?Transfers ?Overall transfer level: Needs assistance ?Equipment used: Lofstrands ?Transfers: Sit to/from Stand ?Sit to Stand: Min guard ?  ?  ?  ?  ?  ?General transfer comment: Pt able to use a combination of handrail to come upright then lofstrand crutch for support once upright. ?  ? ?Ambulation/Gait ?Ambulation/Gait assistance: Min guard ?Gait Distance (Feet): 120 Feet ?Assistive device: Lofstrands ?Gait Pattern/deviations: WFL(Within Functional Limits), Step-through pattern, Decreased step length - right, Decreased stance time - left, Decreased stride length ?Gait velocity: Decreased ?  ?  ?General Gait Details: Pt with good technique utilizing the lofstrand crutch in the RUE.  Slow, but steady gait pattern. ? ?Stairs ?  ?  ?  ?  ?  ? ?Wheelchair Mobility ?  ? ?Modified Rankin (Stroke Patients Only) ?  ? ?  ? ?Balance Overall balance assessment: Independent, Needs assistance ?  ?Sitting balance-Leahy Scale: Fair ?Sitting balance - Comments: Uses his R UE for support  in sitting. ?  ?  ?Standing balance-Leahy Scale: Poor ?Standing balance comment: Requires use of lofstrand crutch for support. ?  ?  ?  ?  ?  ?  ?  ?  ?  ?  ?  ?   ? ? ? ?Pertinent Vitals/Pain Pain Assessment ?Pain Assessment: No/denies pain  ? ? ?Home Living Family/patient expects to be discharged to:: Private residence ?Living  Arrangements: Spouse/significant other ?Available Help at Discharge: Family;Available 24 hours/day ?Type of Home: House ?Home Access: Stairs to enter ?Entrance Stairs-Rails: Right;Left ?Entrance Stairs-Number of Steps: 3 ?  ?Home Layout: One level ?Home Equipment: Cane - single point;Grab bars - tub/shower ?   ?  ?Prior Function Prior Level of Function : Independent/Modified Independent ?  ?  ?  ?  ?  ?  ?  ?  ?  ? ? ?Hand Dominance  ? Dominant Hand: Right ? ?  ?Extremity/Trunk Assessment  ? Upper Extremity Assessment ?Upper Extremity Assessment: LUE deficits/detail ?LUE Deficits / Details: LUE deficit from prior stroke. ?  ? ?Lower Extremity Assessment ?Lower Extremity Assessment: LLE deficits/detail ?LLE Deficits / Details: LLE deficit from prior stroke. ?  ? ?   ?Communication  ? Communication:  (slurred speech)  ?Cognition Arousal/Alertness: Awake/alert ?Behavior During Therapy: [facility] for tasks assessed/performed ?Overall Cognitive Status: Within Functional Limits for tasks assessed ?  ?  ?  ?  ?  ?  ?  ?  ?  ?  ?  ?  ?  ?  ?  ?  ?  ?  ?  ? ?  ?General Comments   ? ?  ?Exercises Other Exercises ?Other Exercises: Pt educated on roles of therapy and services provided during hospital stay.  ? ?Assessment/Plan  ?  ?PT Assessment Patient needs continued PT services  ?PT Problem List Decreased strength;Decreased balance ? ?   ?  ?PT Treatment Interventions DME instruction;Gait training;Functional mobility training;Therapeutic activities;Therapeutic exercise;Balance training;Neuromuscular re-education   ? ?PT Goals (Current goals can be found in the Care Plan section)  ?Acute Rehab PT Goals ?Patient Stated Goal: to go home ?PT Goal Formulation: With patient ?Time For Goal Achievement: 10/31/21 ?Potential to Achieve Goals: Good ? ?  ?Frequency Min 2X/week ?  ? ? ?Co-evaluation   ?  ?  ?  ?  ? ? ?  ?AM-PAC PT "6 Clicks" Mobility  ?Outcome Measure Help needed turning from your back to your side while in a flat bed without  using bedrails?: A Lot ?Help needed moving from lying on your back to sitting on the side of a flat bed without using bedrails?: A Lot ?Help needed moving to and from a bed to a chair (including a wheelchair)?: A Little ?Help needed standing up from a chair using your arms (e.g., wheelchair or bedside chair)?: A Little ?Help needed to walk in hospital room?: A Little ?Help needed climbing 3-5 steps with a railing? : A Lot ?6 Click Score: 15 ? ?  ?End of Session Equipment Utilized During Treatment: Gait belt ?Activity Tolerance: Patient tolerated treatment well ?Patient left: in chair;with call bell/phone within reach;with chair alarm set ?Nurse Communication: Mobility status ?PT Visit Diagnosis: Unsteadiness on feet (R26.81) ?  ? ?Time: 7408-1448 ?PT Time Calculation (min) (ACUTE ONLY): 42 min ? ? ?Charges:   PT Evaluation ?$PT Eval Low Complexity: 1 Low ?PT Treatments ?$Gait Training: 38-52 mins ?  ?   ? ? ?Gwenlyn Saran, PT, DPT ?10/17/21, 2:53 PM ? ? ?Christie Nottingham ?10/17/2021, 2:48  PM ? ?

## 2021-10-17 NOTE — Progress Notes (Signed)
? ?Progress Note ? ?Patient Name: Evan PIEROG Sr. ?Date of Encounter: 10/17/2021 ? ?[facility] Cardiologist: Nelva Bush, MD  ? ?Subjective  ? ?Bradycardia has resolved since holding metoprolol. Heart rates 60-70. ? ?Inpatient Medications  ?  ?Scheduled Meds: ? atorvastatin  80 mg Oral Daily  ? baclofen  10 mg Oral TID  ? insulin aspart  0-5 Units Subcutaneous QHS  ? insulin aspart  0-9 Units Subcutaneous TID WC  ? loratadine  10 mg Oral Daily  ? melatonin  1.5 mg Oral QHS  ? metoCLOPramide  5 mg Oral TID AC  ? pantoprazole  40 mg Oral BID  ? tamsulosin  0.4 mg Oral Daily  ? ?Continuous Infusions: ? sodium chloride 50 mL/hr at 10/16/21 2101  ? ?PRN Meds: ?acetaminophen **OR** acetaminophen, albuterol, loperamide, nitroGLYCERIN, ondansetron **OR** ondansetron (ZOFRAN) IV  ? ?Vital Signs  ?  ?Vitals:  ? 10/17/21 0319 10/17/21 0419 10/17/21 0600 10/17/21 0754  ?BP: (!) 141/66   (!) 146/77  ?Pulse: (!) 50   63  ?Resp: 19   17  ?Temp: (!) 96.5 ?F (35.8 ?C) (!) 97.2 ?F (36.2 ?C) 98 ?F (36.7 ?C) 97.6 ?F (36.4 ?C)  ?TempSrc: Axillary Axillary    ?SpO2: 98%   97%  ?Weight:      ?Height:      ? ? ?Intake/Output Summary (Last 24 hours) at 10/17/2021 0837 ?Last data filed at 10/17/2021 0100 ?Gross per 24 hour  ?Intake 142.81 ml  ?Output --  ?Net 142.81 ml  ? ?Last 3 Weights 10/16/2021 10/16/2021 09/23/2021  ?Weight (lbs) 145 lb 8.1 oz 145 lb 6.4 oz 145 lb  ?Weight (kg) 66 kg 65.953 kg 65.772 kg  ?   ? ?Telemetry  ?  ?NSR. HR slowly improving - Personally Reviewed ? ?ECG  ?  ?No new - Personally Reviewed ? ?Physical Exam  ? ?GEN: No acute distress.   ?Neck: No JVD ?Cardiac: RRR, no murmurs, rubs, or gallops.  ?Respiratory: Clear to auscultation bilaterally. ?GI: Soft, nontender, non-distended  ?MS: No edema; No deformity. ?Neuro:  Nonfocal  ?Psych: Normal affect  ? ?Labs  ?  ?High Sensitivity Troponin:   ?Recent Labs  ?Lab 10/16/21 ?1512 10/16/21 ?1727  ?TROPONINIHS 6 5  ?   ?Chemistry ?Recent Labs  ?Lab 10/16/21 ?1512  ?NA 141   ?K 3.9  ?CL 113*  ?CO2 21*  ?GLUCOSE 104*  ?BUN 31*  ?CREATININE 1.40*  ?CALCIUM 8.9  ?MG 2.1  ?GFRNONAA 56*  ?ANIONGAP 7  ?  ?Lipids No results for input(s): CHOL, TRIG, HDL, LABVLDL, LDLCALC, CHOLHDL in the last 168 hours.  ?Hematology ?Recent Labs  ?Lab 10/16/21 ?1512  ?WBC 4.8  ?RBC 3.48*  ?HGB 9.6*  ?HCT 31.0*  ?MCV 89.1  ?MCH 27.6  ?MCHC 31.0  ?RDW 17.2*  ?PLT 205  ? ?Thyroid  ?Recent Labs  ?Lab 10/16/21 ?1512  ?TSH 0.867  ?FREET4 1.09  ?  ?BNPNo results for input(s): BNP, PROBNP in the last 168 hours.  ?DDimer No results for input(s): DDIMER in the last 168 hours.  ? ?Radiology  ?  ?DG Chest Port 1 View ? ?Result Date: 10/16/2021 ?CLINICAL DATA:  cp EXAM: PORTABLE CHEST 1 VIEW COMPARISON:  Chest x-ray 09/14/2021 FINDINGS: The heart and mediastinal contours are unchanged. Left base streaky airspace opacity likely representing atelectasis. No focal consolidation. No pulmonary edema. No pleural effusion. No pneumothorax. No acute osseous abnormality. IMPRESSION: No active disease. Electronically Signed   By: Iven Finn M.D.   On:  10/16/2021 15:22   ? ?Cardiac Studies  ? ?Heart monitor 10/2019 ?The patient was monitored for 5 days, 15 hours. ?The predominant rhythm was sinus with an average rate of 62 bpm (range 38-138 bpm). ?There were rare PAC's and PVC's. ?No sustained arrhythmia or prolonged pause was identified. ?There were no patient triggered events. ?  ?Predominantly sinus rhythm without significant arrhythmia.  Of note, preceding 14 day monitor showed paroxysmal atrial fibrillation. ? ?MPI 08/2019 ?Narrative & Impression  ?This is a low risk study. ?The calculated left ventricular ejection fraction is moderately decreased (33%). This is likely a gating artifact as Visual EF appears normal at about 55%. recommend correlation with another imaging modality such as echocardiogram. ?There was no evidence for ischemia ?Mild diaphragmatic attenuation noted  ? ?Echo 08/2019 ? ? 1. Left ventricular ejection  fraction, by visual estimation, is 55 to  ?60%. The left ventricle has normal function. There is borderline left  ?ventricular hypertrophy.  ? 2. Left ventricular diastolic parameters are consistent with Grade I  ?diastolic dysfunction (impaired relaxation).  ? 3. The left ventricle has no regional wall motion abnormalities.  ? 4. Global right ventricle has low normal systolic function.The right  ?ventricular size is normal. No increase in right ventricular wall  ?thickness.  ? 5. Left atrial size was normal.  ? 6. Right atrial size was normal.  ? 7. The mitral valve is degenerative. Mild mitral valve regurgitation. No  ?evidence of mitral stenosis.  ? 8. The tricuspid valve is grossly normal.  ? 9. The tricuspid valve is grossly normal. Tricuspid valve regurgitation  ?is not demonstrated.  ?10. The aortic valve is tricuspid. Aortic valve regurgitation is not  ?visualized. No significant stenosis suspected, though evaluation is  ?limited by lack of spectral Doppler images.  ?11. The pulmonic valve was not well visualized. Pulmonic valve  ?regurgitation is not visualized.  ?12. Mildly elevated pulmonary artery systolic pressure.  ?13. The tricuspid regurgitant velocity is 2.94 m/s, and with an assumed  ?right atrial pressure of 3 mmHg, the estimated right ventricular systolic  ?pressure is mildly elevated at 37.6 mmHg.  ?14. The inferior vena cava is normal in size with greater than 50%  ?respiratory variability, suggesting right atrial pressure of 3 mmHg.  ?15. The interatrial septum was not well visualized.  ? ?Patient Profile  ?   ?66 y.o. male with a h/o paroxysmal Afib, DM2, recurrent strokes, PAD, tobacco use who is being seen for bradycardia.  ? ?Assessment & Plan  ?  ?Symptomatic bradycardia ?- presented with symptomatic bradycardia. Metoprolol dose was increased in January, BB was held ?- s/p IV glucagon and calcium ?- Heart rates improving ?- TSH wnl ?- Keep K>4 and Mag>2 ?- avoid BB ?- echo ordered ?-  continue telemetry ? ?Diarrhea ?- Cdiff negative ?- per IM ? ?PAF ?Recurrent strokes ?- he is in NSR ?- he will need Eliquis for stroke ppx ? ?AKI on CKD ?- Scr 1.4 on admission ?- BMET pending ? ?HTN ?- BB held as above.  ?- Bps mildly elevated ? ?For questions or updates, please contact [place] ?Please consult [email or web address] for contact info under  ? ?  ?   ?Signed, ?Mckynlie Vanderslice Ninfa Meeker, PA-C  ?10/17/2021, 8:37 AM    ?

## 2021-10-17 NOTE — Assessment & Plan Note (Addendum)
Ferritin elevated at 335 which goes along with anemia of chronic disease.  Hemoglobin 8.8 upon discharge down from 9.6 upon coming into the hospital.  Patient was given IV fluids during his hospital stay because of acute kidney injury. ?

## 2021-10-18 DIAGNOSIS — R001 Bradycardia, unspecified: Secondary | ICD-10-CM | POA: Diagnosis not present

## 2021-10-18 DIAGNOSIS — D638 Anemia in other chronic diseases classified elsewhere: Secondary | ICD-10-CM

## 2021-10-18 DIAGNOSIS — N179 Acute kidney failure, unspecified: Secondary | ICD-10-CM | POA: Diagnosis not present

## 2021-10-18 DIAGNOSIS — I455 Other specified heart block: Secondary | ICD-10-CM

## 2021-10-18 DIAGNOSIS — N189 Chronic kidney disease, unspecified: Secondary | ICD-10-CM | POA: Diagnosis not present

## 2021-10-18 LAB — CBC
HCT: 27.7 % — ABNORMAL LOW (ref 39.0–52.0)
Hemoglobin: 8.8 g/dL — ABNORMAL LOW (ref 13.0–17.0)
MCH: 27.8 pg (ref 26.0–34.0)
MCHC: 31.8 g/dL (ref 30.0–36.0)
MCV: 87.4 fL (ref 80.0–100.0)
Platelets: 208 10*3/uL (ref 150–400)
RBC: 3.17 MIL/uL — ABNORMAL LOW (ref 4.22–5.81)
RDW: 17.1 % — ABNORMAL HIGH (ref 11.5–15.5)
WBC: 5.3 10*3/uL (ref 4.0–10.5)
nRBC: 0.4 % — ABNORMAL HIGH (ref 0.0–0.2)

## 2021-10-18 LAB — BASIC METABOLIC PANEL
Anion gap: 8 (ref 5–15)
BUN: 21 mg/dL (ref 8–23)
CO2: 22 mmol/L (ref 22–32)
Calcium: 8.9 mg/dL (ref 8.9–10.3)
Chloride: 117 mmol/L — ABNORMAL HIGH (ref 98–111)
Creatinine, Ser: 1.28 mg/dL — ABNORMAL HIGH (ref 0.61–1.24)
GFR, Estimated: >60 mL/min (ref >60)
Glucose, Bld: 74 mg/dL (ref 70–99)
Potassium: 3.6 mmol/L (ref 3.5–5.1)
Sodium: 147 mmol/L — ABNORMAL HIGH (ref 135–145)

## 2021-10-18 LAB — HEMOGLOBIN A1C
Hgb A1c MFr Bld: 7.5 % — ABNORMAL HIGH (ref 4.8–5.6)
Mean Plasma Glucose: 169 mg/dL

## 2021-10-18 LAB — GLUCOSE, CAPILLARY
Glucose-Capillary: 69 mg/dL — ABNORMAL LOW (ref 70–99)
Glucose-Capillary: 149 mg/dL — ABNORMAL HIGH (ref 70–99)
Glucose-Capillary: 94 mg/dL (ref 70–99)

## 2021-10-18 MED ORDER — AMLODIPINE BESYLATE 10 MG PO TABS
10.0000 mg | ORAL_TABLET | Freq: Every day | ORAL | Status: DC
  Administered 2021-10-18: 10 mg via ORAL
  Filled 2021-10-18: qty 1

## 2021-10-18 MED ORDER — DAPAGLIFLOZIN PROPANEDIOL 10 MG PO TABS: 10.0000 mg | ORAL_TABLET | Freq: Every day | ORAL | 0 refills | Status: DC

## 2021-10-18 MED ORDER — APIXABAN 5 MG PO TABS: 5.0000 mg | ORAL_TABLET | Freq: Two times a day (BID) | ORAL | 0 refills | Status: DC

## 2021-10-18 MED ORDER — TAMSULOSIN HCL 0.4 MG PO CAPS: 0.4000 mg | ORAL_CAPSULE | Freq: Every day | ORAL | 0 refills | Status: DC

## 2021-10-18 NOTE — Progress Notes (Addendum)
Progress Note  Patient Name: Evan BRANAN Sr. Date of Encounter: 10/18/2021  [facility] Cardiologist: Nelva Bush, MD   Subjective   Tele showed pause 2.64 seconds overnight. Patient confused at ?baseline. Denies chest pain or SOB.   Inpatient Medications    Scheduled Meds:  atorvastatin  80 mg Oral Daily   baclofen  10 mg Oral TID   enoxaparin (LOVENOX) injection  40 mg Subcutaneous Q24H   insulin aspart  0-5 Units Subcutaneous QHS   insulin aspart  0-9 Units Subcutaneous TID WC   loratadine  10 mg Oral Daily   melatonin  2.5 mg Oral QHS   metoCLOPramide  5 mg Oral TID AC   pantoprazole  40 mg Oral BID   tamsulosin  0.4 mg Oral Daily   Continuous Infusions:  sodium chloride 50 mL/hr at 10/16/21 2101   PRN Meds: acetaminophen **OR** acetaminophen, albuterol, loperamide, nitroGLYCERIN, ondansetron **OR** ondansetron (ZOFRAN) IV   Vital Signs    Vitals:   10/17/21 2356 10/18/21 0138 10/18/21 0248 10/18/21 0725  BP: (!) 154/71 (!) 162/78 (!) 157/82 (!) 178/76  Pulse: (!) 42 (!) 45 (!) 46 (!) 56  Resp: 18 16 18 17   Temp: (!) 97.1 F (36.2 C)  (!) 97.3 F (36.3 C) 98.2 F (36.8 C)  TempSrc:      SpO2: 100% 100% 97% 96%  Weight:      Height:        Intake/Output Summary (Last 24 hours) at 10/18/2021 0914 Last data filed at 10/18/2021 0725 Gross per 24 hour  Intake 644.19 ml  Output 1450 ml  Net -805.81 ml   Last 3 Weights 10/16/2021 10/16/2021 09/23/2021  Weight (lbs) 145 lb 8.1 oz 145 lb 6.4 oz 145 lb  Weight (kg) 66 kg 65.953 kg 65.772 kg      Telemetry    NSR, HR 50-60s, 2.64sec pause overnight with bradycardia - Personally Reviewed  ECG    No new - Personally Reviewed  Physical Exam   GEN: No acute distress.   Neck: No JVD Cardiac: bradycardic, RR, no murmurs, rubs, or gallops.  Respiratory: Clear to auscultation bilaterally. GI: Soft, nontender, non-distended  MS: No edema; No deformity. Neuro:  Nonfocal  Psych: Normal affect   Labs     High Sensitivity Troponin:   Recent Labs  Lab 10/16/21 1512 10/16/21 1727  TROPONINIHS 6 5     Chemistry Recent Labs  Lab 10/16/21 1512 10/17/21 0936 10/18/21 0613  NA 141 142 147*  K 3.9 3.7 3.6  CL 113* 117* 117*  CO2 21* 20* 22  GLUCOSE 104* 122* 74  BUN 31* 28* 21  CREATININE 1.40* 1.53* 1.28*  CALCIUM 8.9 8.6* 8.9  MG 2.1  --   --   GFRNONAA 56* 50* >60  ANIONGAP 7 5 8     Lipids No results for input(s): CHOL, TRIG, HDL, LABVLDL, LDLCALC, CHOLHDL in the last 168 hours.  Hematology Recent Labs  Lab 10/16/21 1512 10/17/21 0936 10/18/21 0613  WBC 4.8 4.5 5.3  RBC 3.48* 3.35* 3.17*  HGB 9.6* 9.5* 8.8*  HCT 31.0* 29.3* 27.7*  MCV 89.1 87.5 87.4  MCH 27.6 28.4 27.8  MCHC 31.0 32.4 31.8  RDW 17.2* 17.4* 17.1*  PLT 205 210 208   Thyroid  Recent Labs  Lab 10/16/21 1512  TSH 0.867  FREET4 1.09    BNPNo results for input(s): BNP, PROBNP in the last 168 hours.  DDimer No results for input(s): DDIMER in the last 168  hours.   Radiology    DG Chest Port 1 View  Result Date: 10/16/2021 CLINICAL DATA:  cp EXAM: PORTABLE CHEST 1 VIEW COMPARISON:  Chest x-ray 09/14/2021 FINDINGS: The heart and mediastinal contours are unchanged. Left base streaky airspace opacity likely representing atelectasis. No focal consolidation. No pulmonary edema. No pleural effusion. No pneumothorax. No acute osseous abnormality. IMPRESSION: No active disease. Electronically Signed   By: Iven Finn M.D.   On: 10/16/2021 15:22   ECHOCARDIOGRAM COMPLETE  Result Date: 10/17/2021    ECHOCARDIOGRAM REPORT   Patient Name:   Evan Townsend Sr. Date of Exam: 10/17/2021 Medical Rec #:  [id number]           Height:       64.0 in Accession #:    [id number]          Weight:       145.5 lb Date of Birth:  [birth year]           BSA:          1.709 m Patient Age:    66 years            BP:           146/77 mmHg Patient Gender: M                   HR:           49 bpm. Exam Location:  [facility] Procedure: 2D Echo,  Color Doppler and Cardiac Doppler Indications:     R94.31 Abnormal ECG  History:         Patient has prior history of Echocardiogram examinations. Risk                  Factors:Hypertension, Diabetes, HCL and Current Smoker.  Sonographer:     Charmayne Sheer Referring Phys:  [place] Phys: Ida Rogue MD IMPRESSIONS  1. Left ventricular ejection fraction, by estimation, is 50 to 55%. The left ventricle has low normal function. The left ventricle has no regional wall motion abnormalities. Left ventricular diastolic parameters are consistent with Grade I diastolic dysfunction (impaired relaxation).  2. Right ventricular systolic function is normal. The right ventricular size is normal. There is normal pulmonary artery systolic pressure. The estimated right ventricular systolic pressure is [id number] mmHg.  3. The mitral valve is normal in structure. Mild mitral valve regurgitation. No evidence of mitral stenosis.  4. The aortic valve is tricuspid. Aortic valve regurgitation is not visualized. No aortic stenosis is present.  5. The inferior vena cava is normal in size with greater than 50% respiratory variability, suggesting right atrial pressure of 3 mmHg. FINDINGS  Left Ventricle: Left ventricular ejection fraction, by estimation, is 50 to 55%. The left ventricle has low normal function. The left ventricle has no regional wall motion abnormalities. The left ventricular internal cavity size was normal in size. There is no left ventricular hypertrophy. Left ventricular diastolic parameters are consistent with Grade I diastolic dysfunction (impaired relaxation). Right Ventricle: The right ventricular size is normal. No increase in right ventricular wall thickness. Right ventricular systolic function is normal. There is normal pulmonary artery systolic pressure. The tricuspid regurgitant velocity is 2.29 m/s, and  with an assumed right atrial pressure of 5 mmHg, the estimated right ventricular systolic  pressure is [id number] mmHg. Left Atrium: Left atrial size was normal in size. Right Atrium: Right atrial size was normal in size. Pericardium: There is no evidence of pericardial effusion.  Mitral Valve: The mitral valve is normal in structure. Mild mitral valve regurgitation. No evidence of mitral valve stenosis. MV peak gradient, 5.9 mmHg. The mean mitral valve gradient is 2.0 mmHg. Tricuspid Valve: The tricuspid valve is normal in structure. Tricuspid valve regurgitation is trivial. No evidence of tricuspid stenosis. Aortic Valve: The aortic valve is tricuspid. Aortic valve regurgitation is not visualized. No aortic stenosis is present. Aortic valve mean gradient measures 3.0 mmHg. Aortic valve peak gradient measures 5.6 mmHg. Aortic valve area, by VTI measures 1.89 cm. Pulmonic Valve: The pulmonic valve was normal in structure. Pulmonic valve regurgitation is not visualized. No evidence of pulmonic stenosis. Aorta: The aortic root is normal in size and structure. Venous: The inferior vena cava is normal in size with greater than 50% respiratory variability, suggesting right atrial pressure of 3 mmHg. IAS/Shunts: No atrial level shunt detected by color flow Doppler.  LEFT VENTRICLE PLAX 2D LVIDd:         4.84 cm   Diastology LVIDs:         3.45 cm   LV e' medial:    5.98 cm/s LV PW:         1.03 cm   LV E/e' medial:  11.7 LV IVS:        0.82 cm   LV e' lateral:   6.85 cm/s LVOT diam:     1.90 cm   LV E/e' lateral: 10.2 LV SV:         56 LV SV Index:   33 LVOT Area:     2.84 cm  RIGHT VENTRICLE RV Basal diam:  2.58 cm LEFT ATRIUM             Index        RIGHT ATRIUM           Index LA diam:        4.00 cm 2.34 cm/m   RA Area:     10.10 cm LA Vol (A2C):   57.2 ml 33.47 ml/m  RA Volume:   18.40 ml  10.77 ml/m LA Vol (A4C):   53.1 ml 31.07 ml/m LA Biplane Vol: 60.3 ml 35.28 ml/m  AORTIC VALVE                    PULMONIC VALVE AV Area (Vmax):    1.85 cm     PV Vmax:       1.08 m/s AV Area (Vmean):   1.94 cm      PV Vmean:      69.600 cm/s AV Area (VTI):     1.89 cm     PV VTI:        0.252 m AV Vmax:           118.00 cm/s  PV Peak grad:  4.7 mmHg AV Vmean:          80.400 cm/s  PV Mean grad:  2.0 mmHg AV VTI:            0.298 m AV Peak Grad:      5.6 mmHg AV Mean Grad:      3.0 mmHg LVOT Vmax:         77.00 cm/s LVOT Vmean:        55.000 cm/s LVOT VTI:          0.199 m LVOT/AV VTI ratio: 0.67  AORTA Ao Root diam: 3.30 cm MITRAL VALVE  TRICUSPID VALVE MV Area (PHT): 2.83 cm    TR Peak grad:   21.0 mmHg MV Area VTI:   1.55 cm    TR Vmax:        229.00 cm/s MV Peak grad:  5.9 mmHg MV Mean grad:  2.0 mmHg    SHUNTS MV Vmax:       1.21 m/s    Systemic VTI:  0.20 m MV Vmean:      72.4 cm/s   Systemic Diam: 1.90 cm MV Decel Time: 268 msec MV E velocity: 69.80 cm/s MV A velocity: 78.00 cm/s MV E/A ratio:  0.89 Ida Rogue MD Electronically signed by Ida Rogue MD Signature Date/Time: 10/17/2021/2:22:10 PM    Final     Cardiac Studies   Echo 10/17/21  1. Left ventricular ejection fraction, by estimation, is 50 to 55%. The  left ventricle has low normal function. The left ventricle has no regional  wall motion abnormalities. Left ventricular diastolic parameters are  consistent with Grade I diastolic  dysfunction (impaired relaxation).   2. Right ventricular systolic function is normal. The right ventricular  size is normal. There is normal pulmonary artery systolic pressure. The  estimated right ventricular systolic pressure is [id number] mmHg.   3. The mitral valve is normal in structure. Mild mitral valve  regurgitation. No evidence of mitral stenosis.   4. The aortic valve is tricuspid. Aortic valve regurgitation is not  visualized. No aortic stenosis is present.   5. The inferior vena cava is normal in size with greater than 50%  respiratory variability, suggesting right atrial pressure of 3 mmHg.   Heart monitor 10/2019 The patient was monitored for 5 days, 15 hours. The predominant rhythm was  sinus with an average rate of 62 bpm (range 38-138 bpm). There were rare PAC's and PVC's. No sustained arrhythmia or prolonged pause was identified. There were no patient triggered events.   Predominantly sinus rhythm without significant arrhythmia.  Of note, preceding 14 day monitor showed paroxysmal atrial fibrillation.   MPI 08/2019 Narrative & Impression  This is a low risk study. The calculated left ventricular ejection fraction is moderately decreased (33%). This is likely a gating artifact as Visual EF appears normal at about 55%. recommend correlation with another imaging modality such as echocardiogram. There was no evidence for ischemia Mild diaphragmatic attenuation noted    Echo 08/2019   1. Left ventricular ejection fraction, by visual estimation, is 55 to  60%. The left ventricle has normal function. There is borderline left  ventricular hypertrophy.   2. Left ventricular diastolic parameters are consistent with Grade I  diastolic dysfunction (impaired relaxation).   3. The left ventricle has no regional wall motion abnormalities.   4. Global right ventricle has low normal systolic function.The right  ventricular size is normal. No increase in right ventricular wall  thickness.   5. Left atrial size was normal.   6. Right atrial size was normal.   7. The mitral valve is degenerative. Mild mitral valve regurgitation. No  evidence of mitral stenosis.   8. The tricuspid valve is grossly normal.   9. The tricuspid valve is grossly normal. Tricuspid valve regurgitation  is not demonstrated.  10. The aortic valve is tricuspid. Aortic valve regurgitation is not  visualized. No significant stenosis suspected, though evaluation is  limited by lack of spectral Doppler images.  11. The pulmonic valve was not well visualized. Pulmonic valve  regurgitation is not visualized.  12. Mildly elevated pulmonary artery  systolic pressure.  13. The tricuspid regurgitant velocity is 2.94  m/s, and with an assumed  right atrial pressure of 3 mmHg, the estimated right ventricular systolic  pressure is mildly elevated at 37.6 mmHg.  14. The inferior vena cava is normal in size with greater than 50%  respiratory variability, suggesting right atrial pressure of 3 mmHg.  15. The interatrial septum was not well visualized.    Patient Profile     66 y.o. male with a h/o paroxysmal Afib, DM2, recurrent strokes, PAD, tobacco use who is being seen for bradycardia.   Assessment & Plan    Symptomatic bradycardia - presented with symptomatic bradycardia with heart rates in the 30s. Metoprolol dose was increased in January up to 50mg  daily. BB was held on admission - s/p IV glucagon and calcium - Heart rates better 50-60s - telemetry showed overnight pause 2.64 seconds with nocturnal bradycardia - TSH wnl - Keep K>4 and Mag>2 - avoid BB - echo showed LVEF 50-55%, no WMA, G1DD, normal RVSF, mild MR. - continue telemetry - possible d/c today. Given nocturnal pause less than 3 seconds consider live heart monitor at discharge. MD to see   Diarrhea - Cdiff negative - resolved - per IM   PAF Recurrent strokes - he is in NSR - he Anthonie Lotito need Eliquis for stroke ppx   AKI on CKD - Scr 1.4 on admission - SCR improved    HTN - BB held as above.  - Bps mildly elevated, I Daelan Gatt re-start amlodipine 10mg  daily  For questions or updates, please contact [place] Please consult [email or web address] for contact info under        Signed, Cadence Ninfa Meeker, PA-C  10/18/2021, 9:14 AM     I have seen and examined this patient with Cadence Furth.  Agree with above, note added to reflect my findings.  Patient feeling well without complaint today.  No further bradycardia noted.  GEN: Well nourished, well developed, in no acute distress  HEENT: normal  Neck: no JVD, carotid bruits, or masses Cardiac: RRR; no murmurs, rubs, or gallops,no edema  Respiratory:  clear to auscultation bilaterally,  normal work of breathing GI: soft, nontender, nondistended, + BS MS: no deformity or atrophy  Skin: warm and dry, device site well healed Neuro:  Strength and sensation are intact Psych: euthymic mood, full affect   Symptomatic bradycardia: Was on metoprolol previously.  With holding metoprolol, bradycardia has improved.  We Klark Vanderhoef continue to hold on all AV nodal blocking agents.  No indication for pacemaker implant at this time.  He Kyler Lerette apparently need Eliquis for recurrent strokes.  Cardiology to sign off.  Let us know if there is anything further that we can do to assist with his care.  Gauri Galvao M. Kimala Horne MD 10/18/2021 11:22 AM

## 2021-10-18 NOTE — Progress Notes (Signed)
Patient discharging home per MD order. All discharge instructions, medications, and follow ups reviewed with patient and his wife, both state understanding. Cardiology has ordered for the patient to wear a cardiac monitor at home. Per Dr. Renae Gloss, the cardiac monitor is to be placed on the patient on a follow up appointment in the Cardiology office. Patient in no acute distress at time of discharge. Transport home per patients wife in private vehicle.  ?

## 2021-10-18 NOTE — Progress Notes (Signed)
Pt with 2.62 seconds pause per CCMD. Vitals obtained. Unable to obtain temperature. Bear hugger applied. NP Jon Billings made aware. No new orders given.will continue to monitor patient.  ?VWilliams, Charity fundraiser. ?

## 2021-10-18 NOTE — Discharge Summary (Signed)
Physician Discharge Summary   Patient: Evan SCHRADER Sr. MRN: [id number] DOB: May 13, 1956  Admit date:     10/16/2021  Discharge date: 10/18/21  Discharge Physician: Loletha Grayer   PCP: Charlynne Cousins, MD   Recommendations at discharge:   Follow-up PCP 5 days Follow-up cardiology for outpatient heart monitor  Discharge Diagnoses: Principal Problem:   Acute kidney injury superimposed on CKD ([place]) Active Problems:   Bradycardia   Diarrhea   Recurrent strokes (HCC)   Essential hypertension   Type 2 diabetes mellitus with hyperlipidemia (HCC)   Gastroesophageal reflux disease   Anemia of chronic disease    Hospital Course: The patient was admitted to the hospital on 10/16/2021 and discharged on 10/18/2021.  Initially came in with complaints of diarrhea and sent in because of bradycardia.  He was taking Toprol XL prior to coming into the hospital.  This medication was discontinued and reversed with glucagon and calcium IV.  Heart rate came up from the 30s into the 60s.  On the day of discharge he did have heart rate in the 50s.  He did have 1 pause of 2.62 seconds.  Case discussed with cardiology and they recommended an outpatient heart monitor.  Unfortunately we were unable to set up this heart monitor on the weekend here from the hospital.  Case discussed with the cardiologist and they will set up through their office.  The patient also had diarrhea and his stool studies were negative.  Holding metformin at this time.  Patient also had acute kidney injury superimposed on chronic kidney disease stage II.  Baseline creatinine around 1.11.  Creatinine peaked at 1.52 and came down to 1.28 upon discharge.  The patient was given IV fluids.  Assessment and Plan: * Acute kidney injury superimposed on CKD ([place]) Creatinine was 1.4 on presentation went up to 1.53 and upon discharge down to 1.28.  Check a BMP as outpatient.  Continue Flomax for previous urinary retention.  Bradycardia Came in with  heart rates in the 30s and 40s.  Gave glucagon and calcium on admission to reverse beta-blocker.  Discontinue Toprol-XL.  The patient did have a sinus pause of 2.62 seconds.  Case discussed with cardiology and okay to discharge home.  No need for pacemaker.  Diarrhea Resolved.  Stool studies negative.  Discontinue metformin for now  Recurrent strokes ([place]) With paroxysmal atrial fibrillation cardiology recommended going back on Eliquis which was prescribed upon discharge.  Essential hypertension Initially antihypertensive medications were held.  Upon discharge can go back on Norvasc.  Type 2 diabetes mellitus with hyperlipidemia (HCC) We held oral medications upon coming into the hospital.  Can go back on Farxiga as outpatient.  Hold metformin because of diarrhea.  Continue atorvastatin.  Gastroesophageal reflux disease Continue PPI  Anemia of chronic disease Ferritin elevated at 335 which goes along with anemia of chronic disease.  Hemoglobin 8.8 upon discharge down from 9.6 upon coming into the hospital.  Patient was given IV fluids during his hospital stay because of acute kidney injury.           Consultants: Cardiology Procedures performed: None Disposition: Home Diet recommendation:  Carb modified diet heart healthy.  DISCHARGE MEDICATION: Allergies as of 10/18/2021   No Known Allergies      Medication List     STOP taking these medications    irbesartan 300 MG tablet Commonly known as: AVAPRO   metFORMIN 500 MG tablet Commonly known as: GLUCOPHAGE   Senna Plus 8.6-50 MG tablet Generic  drug: senna-docusate   sitaGLIPtin 100 MG tablet Commonly known as: Januvia       TAKE these medications    Accu-Chek Guide test strip Generic drug: glucose blood Check fsbs once daily   Accu-Chek Softclix Lancets lancets SMARTSIG:Topical   albuterol 108 (90 Base) MCG/ACT inhaler Commonly known as: VENTOLIN HFA Inhale 2 puffs into the lungs every 6 (six) hours  as needed for wheezing or shortness of breath.   amLODipine 10 MG tablet Commonly known as: NORVASC Take 1 tablet (10 mg total) by mouth daily.   apixaban 5 MG Tabs tablet Commonly known as: Eliquis Take 1 tablet (5 mg total) by mouth 2 (two) times daily.   atorvastatin 80 MG tablet Commonly known as: LIPITOR Take 1 tablet (80 mg total) by mouth daily.   baclofen 10 MG tablet Commonly known as: LIORESAL Take 1 tablet (10 mg total) by mouth 3 (three) times daily.   Comfort Protect Adult Diaper/L Misc 1 Units by Does not apply route daily. Use 3-4 a day as needed   dapagliflozin propanediol 10 MG Tabs tablet Commonly known as: Farxiga Take 1 tablet (10 mg total) by mouth daily before breakfast. In place of glipizide   fexofenadine 180 MG tablet Commonly known as: Allegra Allergy Take 1 tablet (180 mg total) by mouth daily.   Fifty50 Glucose Meter 2.0 w/Device Kit Use as instructed   Blood Glucose Monitoring Suppl Kit Accucheck Guide with lancets and test strips #100 with one refill.  Test blood sugar twice daily. DX Code E11.65 ; Z79.4   FreeStyle Libre 2 Sensor Misc 1 each by Does not apply route every 14 (fourteen) days.   Insulin Pen Needle 32G X 6 MM Misc Daily   ketoconazole 2 % cream Commonly known as: NIZORAL Apply 1 application topically daily.   melatonin 1 MG Tabs tablet TAKE 1 TABLET BY MOUTH AT BEDTIME   metoCLOPramide 5 MG tablet Commonly known as: REGLAN TAKE 1 TABLET BY MOUTH 3 TIMES DAILY BEFORE MEALS   Misc. Devices Misc One pair of Compression stockings  Hemiplegia and hemiparesis following cerebral infarction affecting left non-dominant side (HCC)  - Primary Codes: L87.564 Lower extremity edema  Codes: R60.0 Type 2 diabetes mellitus with hyperglycemia, with long-term current use of insulin (HCC)  Codes: E11.65, Z79.4   nitroGLYCERIN 0.4 MG SL tablet Commonly known as: NITROSTAT Place 1 tablet (0.4 mg total) under the tongue every 5  (five) minutes as needed for chest pain.   omeprazole 40 MG capsule Commonly known as: PRILOSEC TAKE 1 CAPSULE BY MOUTH IN THE MORNING AND AT BEDTIME   tamsulosin 0.4 MG Caps capsule Commonly known as: FLOMAX Take 1 capsule (0.4 mg total) by mouth daily.        Follow-up Information     Vigg, Avanti, MD Follow up in 5 day(s).   Specialty: Internal Medicine Contact information: [address] [place] [address] [phone]         Nelva Bush, MD Follow up in 1 week(s).   Specialty: Cardiology Contact information: [place] [address] [phone]                 Discharge Exam: Danley Danker Weights   10/16/21 1457  Weight: 66 kg   Physical Exam HENT:     Head: Normocephalic.     Mouth/Throat:     Pharynx: No oropharyngeal exudate.  Eyes:     General: Lids are normal.     Conjunctiva/sclera:  Conjunctivae normal.  Cardiovascular:     Rate and Rhythm: Normal rate and regular rhythm.     Heart sounds: Normal heart sounds, S1 normal and S2 normal.  Pulmonary:     Breath sounds: No decreased breath sounds, wheezing, rhonchi or rales.  Abdominal:     Palpations: Abdomen is soft.     Tenderness: There is no abdominal tenderness.  Musculoskeletal:     Right lower leg: No swelling.     Left lower leg: No swelling.  Skin:    General: Skin is warm.     Findings: No rash.  Neurological:     Mental Status: He is alert and oriented to person, place, and time.     Condition at discharge: stable  The results of significant diagnostics from this hospitalization (including imaging, microbiology, ancillary and laboratory) are listed below for reference.   Imaging Studies: DG Chest Port 1 View  Result Date: 10/16/2021 CLINICAL DATA:  cp EXAM: PORTABLE CHEST 1 VIEW COMPARISON:  Chest x-ray 09/14/2021 FINDINGS: The heart and mediastinal contours are unchanged. Left base streaky airspace opacity likely representing atelectasis. No focal  consolidation. No pulmonary edema. No pleural effusion. No pneumothorax. No acute osseous abnormality. IMPRESSION: No active disease. Electronically Signed   By: Iven Finn M.D.   On: 10/16/2021 15:22   ECHOCARDIOGRAM COMPLETE  Result Date: 10/17/2021    ECHOCARDIOGRAM REPORT   Patient Name:   WEYLYN RICCIUTI Sr. Date of Exam: 10/17/2021 Medical Rec #:  [id number]           Height:       64.0 in Accession #:    [id number]          Weight:       145.5 lb Date of Birth:  10/01/1955           BSA:          1.709 m Patient Age:    65 years            BP:           146/77 mmHg Patient Gender: M                   HR:           49 bpm. Exam Location:  [facility] Procedure: 2D Echo, Color Doppler and Cardiac Doppler Indications:     R94.31 Abnormal ECG  History:         Patient has prior history of Echocardiogram examinations. Risk                  Factors:Hypertension, Diabetes, HCL and Current Smoker.  Sonographer:     Charmayne Sheer Referring Phys:  Roberts Phys: Ida Rogue MD IMPRESSIONS  1. Left ventricular ejection fraction, by estimation, is 50 to 55%. The left ventricle has low normal function. The left ventricle has no regional wall motion abnormalities. Left ventricular diastolic parameters are consistent with Grade I diastolic dysfunction (impaired relaxation).  2. Right ventricular systolic function is normal. The right ventricular size is normal. There is normal pulmonary artery systolic pressure. The estimated right ventricular systolic pressure is 25.9 mmHg.  3. The mitral valve is normal in structure. Mild mitral valve regurgitation. No evidence of mitral stenosis.  4. The aortic valve is tricuspid. Aortic valve regurgitation is not visualized. No aortic stenosis is present.  5. The inferior vena cava is normal in size with greater than 50% respiratory variability, suggesting right atrial  pressure of 3 mmHg. FINDINGS  Left Ventricle: Left ventricular ejection fraction, by estimation,  is 50 to 55%. The left ventricle has low normal function. The left ventricle has no regional wall motion abnormalities. The left ventricular internal cavity size was normal in size. There is no left ventricular hypertrophy. Left ventricular diastolic parameters are consistent with Grade I diastolic dysfunction (impaired relaxation). Right Ventricle: The right ventricular size is normal. No increase in right ventricular wall thickness. Right ventricular systolic function is normal. There is normal pulmonary artery systolic pressure. The tricuspid regurgitant velocity is 2.29 m/s, and  with an assumed right atrial pressure of 5 mmHg, the estimated right ventricular systolic pressure is 16.1 mmHg. Left Atrium: Left atrial size was normal in size. Right Atrium: Right atrial size was normal in size. Pericardium: There is no evidence of pericardial effusion. Mitral Valve: The mitral valve is normal in structure. Mild mitral valve regurgitation. No evidence of mitral valve stenosis. MV peak gradient, 5.9 mmHg. The mean mitral valve gradient is 2.0 mmHg. Tricuspid Valve: The tricuspid valve is normal in structure. Tricuspid valve regurgitation is trivial. No evidence of tricuspid stenosis. Aortic Valve: The aortic valve is tricuspid. Aortic valve regurgitation is not visualized. No aortic stenosis is present. Aortic valve mean gradient measures 3.0 mmHg. Aortic valve peak gradient measures 5.6 mmHg. Aortic valve area, by VTI measures 1.89 cm. Pulmonic Valve: The pulmonic valve was normal in structure. Pulmonic valve regurgitation is not visualized. No evidence of pulmonic stenosis. Aorta: The aortic root is normal in size and structure. Venous: The inferior vena cava is normal in size with greater than 50% respiratory variability, suggesting right atrial pressure of 3 mmHg. IAS/Shunts: No atrial level shunt detected by color flow Doppler.  LEFT VENTRICLE PLAX 2D LVIDd:         4.84 cm   Diastology LVIDs:         3.45 cm    LV e' medial:    5.98 cm/s LV PW:         1.03 cm   LV E/e' medial:  11.7 LV IVS:        0.82 cm   LV e' lateral:   6.85 cm/s LVOT diam:     1.90 cm   LV E/e' lateral: 10.2 LV SV:         56 LV SV Index:   33 LVOT Area:     2.84 cm  RIGHT VENTRICLE RV Basal diam:  2.58 cm LEFT ATRIUM             Index        RIGHT ATRIUM           Index LA diam:        4.00 cm 2.34 cm/m   RA Area:     10.10 cm LA Vol (A2C):   57.2 ml 33.47 ml/m  RA Volume:   18.40 ml  10.77 ml/m LA Vol (A4C):   53.1 ml 31.07 ml/m LA Biplane Vol: 60.3 ml 35.28 ml/m  AORTIC VALVE                    PULMONIC VALVE AV Area (Vmax):    1.85 cm     PV Vmax:       1.08 m/s AV Area (Vmean):   1.94 cm     PV Vmean:      69.600 cm/s AV Area (VTI):     1.89 cm     PV VTI:  0.252 m AV Vmax:           118.00 cm/s  PV Peak grad:  4.7 mmHg AV Vmean:          80.400 cm/s  PV Mean grad:  2.0 mmHg AV VTI:            0.298 m AV Peak Grad:      5.6 mmHg AV Mean Grad:      3.0 mmHg LVOT Vmax:         77.00 cm/s LVOT Vmean:        55.000 cm/s LVOT VTI:          0.199 m LVOT/AV VTI ratio: 0.67  AORTA Ao Root diam: 3.30 cm MITRAL VALVE               TRICUSPID VALVE MV Area (PHT): 2.83 cm    TR Peak grad:   21.0 mmHg MV Area VTI:   1.55 cm    TR Vmax:        229.00 cm/s MV Peak grad:  5.9 mmHg MV Mean grad:  2.0 mmHg    SHUNTS MV Vmax:       1.21 m/s    Systemic VTI:  0.20 m MV Vmean:      72.4 cm/s   Systemic Diam: 1.90 cm MV Decel Time: 268 msec MV E velocity: 69.80 cm/s MV A velocity: 78.00 cm/s MV E/A ratio:  0.89 Ida Rogue MD Electronically signed by Ida Rogue MD Signature Date/Time: 10/17/2021/2:22:10 PM    Final     Microbiology: Results for orders placed or performed during the hospital encounter of 10/16/21  Resp Panel by RT-PCR (Flu A&B, Covid) STOOL     Status: None   Collection Time: 10/16/21  5:27 PM   Specimen: STOOL; Nasopharyngeal(NP) swabs in vial transport medium  Result Value Ref Range Status   SARS Coronavirus 2 by RT PCR  NEGATIVE NEGATIVE Final    Comment: (NOTE) SARS-CoV-2 target nucleic acids are NOT DETECTED.  The SARS-CoV-2 RNA is generally detectable in upper respiratory specimens during the acute phase of infection. The lowest concentration of SARS-CoV-2 viral copies this assay can detect is 138 copies/mL. A negative result does not preclude SARS-Cov-2 infection and should not be used as the sole basis for treatment or other patient management decisions. A negative result may occur with  improper specimen collection/handling, submission of specimen other than nasopharyngeal swab, presence of viral mutation(s) within the areas targeted by this assay, and inadequate number of viral copies(<138 copies/mL). A negative result must be combined with clinical observations, patient history, and epidemiological information. The expected result is Negative.  Fact Sheet for Patients:  EntrepreneurPulse.com.au  Fact Sheet for Healthcare Providers:  IncredibleEmployment.be  This test is no t yet approved or cleared by the [place] FDA and  has been authorized for detection and/or diagnosis of SARS-CoV-2 by FDA under an Emergency Use Authorization (EUA). This EUA will remain  in effect (meaning this test can be used) for the duration of the COVID-19 declaration under Section 564(b)(1) of the Act, 21 U.S.C.section 360bbb-3(b)(1), unless the authorization is terminated  or revoked sooner.       Influenza A by PCR NEGATIVE NEGATIVE Final   Influenza B by PCR NEGATIVE NEGATIVE Final    Comment: (NOTE) The Xpert Xpress SARS-CoV-2/FLU/RSV plus assay is intended as an aid in the diagnosis of influenza from Nasopharyngeal swab specimens and should not be used as a sole basis for treatment. Nasal  washings and aspirates are unacceptable for Xpert Xpress SARS-CoV-2/FLU/RSV testing.  Fact Sheet for Patients: EntrepreneurPulse.com.au  Fact Sheet for  Healthcare Providers: IncredibleEmployment.be  This test is not yet approved or cleared by the [place] FDA and has been authorized for detection and/or diagnosis of SARS-CoV-2 by FDA under an Emergency Use Authorization (EUA). This EUA will remain in effect (meaning this test can be used) for the duration of the COVID-19 declaration under Section 564(b)(1) of the Act, 21 U.S.C. section 360bbb-3(b)(1), unless the authorization is terminated or revoked.  Performed at [facility], [place], [place] [address]   C Difficile Quick Screen w PCR reflex     Status: None   Collection Time: 10/16/21  5:27 PM   Specimen: STOOL  Result Value Ref Range Status   C Diff antigen NEGATIVE NEGATIVE Final   C Diff toxin NEGATIVE NEGATIVE Final   C Diff interpretation No C. difficile detected.  Final    Comment: Performed at [facility], [address] [place], [place] [address]  Gastrointestinal Panel by PCR , Stool     Status: None   Collection Time: 10/16/21  5:27 PM   Specimen: STOOL  Result Value Ref Range Status   Campylobacter species NOT DETECTED NOT DETECTED Final   Plesimonas shigelloides NOT DETECTED NOT DETECTED Final   Salmonella species NOT DETECTED NOT DETECTED Final   Yersinia enterocolitica NOT DETECTED NOT DETECTED Final   Vibrio species NOT DETECTED NOT DETECTED Final   Vibrio cholerae NOT DETECTED NOT DETECTED Final   Enteroaggregative E coli (EAEC) NOT DETECTED NOT DETECTED Final   Enteropathogenic E coli (EPEC) NOT DETECTED NOT DETECTED Final   Enterotoxigenic E coli (ETEC) NOT DETECTED NOT DETECTED Final   Shiga like toxin producing E coli (STEC) NOT DETECTED NOT DETECTED Final   Shigella/Enteroinvasive E coli (EIEC) NOT DETECTED NOT DETECTED Final   Cryptosporidium NOT DETECTED NOT DETECTED Final   Cyclospora cayetanensis NOT DETECTED NOT DETECTED Final   Entamoeba histolytica NOT DETECTED NOT DETECTED Final    Giardia lamblia NOT DETECTED NOT DETECTED Final   Adenovirus F40/41 NOT DETECTED NOT DETECTED Final   Astrovirus NOT DETECTED NOT DETECTED Final   Norovirus GI/GII NOT DETECTED NOT DETECTED Final   Rotavirus A NOT DETECTED NOT DETECTED Final   Sapovirus (I, II, IV, and V) NOT DETECTED NOT DETECTED Final    Comment: Performed at [facility], [address] [place],  [address]    Labs: CBC: Recent Labs  Lab 10/16/21 1512 10/17/21 0936 10/18/21 0613  WBC 4.8 4.5 5.3  HGB 9.6* 9.5* 8.8*  HCT 31.0* 29.3* 27.7*  MCV 89.1 87.5 87.4  PLT 205 210 374   Basic Metabolic Panel: Recent Labs  Lab 10/16/21 1512 10/17/21 0936 10/18/21 0613  NA 141 142 147*  K 3.9 3.7 3.6  CL 113* 117* 117*  CO2 21* 20* 22  GLUCOSE 104* 122* 74  BUN 31* 28* 21  CREATININE 1.40* 1.53* 1.28*  CALCIUM 8.9 8.6* 8.9  MG 2.1  --   --     CBG: Recent Labs  Lab 10/17/21 1659 10/17/21 1926 10/18/21 0725 10/18/21 0802 10/18/21 1159  GLUCAP 146* 131* 69* 149* 94    Discharge time spent: greater than 30 minutes.  Signed: Loletha Grayer, MD [facility] 10/18/2021

## 2021-10-18 NOTE — Discharge Instructions (Addendum)
Stop metoprolol (toprol xl) ? ?Hold metformin (glucophage) for now ? ?Follow up with cardiology for heart monitor, unable to set up from hospital ?

## 2021-10-18 NOTE — TOC Transition Note (Signed)
Transition of Care (TOC) - CM/SW Discharge Note ? ? ?Patient Details  ?Name: Evan STREBECK Sr. ?MRN: [id number] ?Date of Birth: 1956-07-02 ? ?Transition of Care (TOC) CM/SW Contact:  ?Luvenia Redden, RN ?Phone [phone] ?10/18/2021, 10:29 AM ? ? ?Clinical Narrative:    ?Spoke with spouse Evan Townsend concerning HHPT recommendations. [facility] [place] has accepted services for pt upon discharge. Wife also states pt has a aide in the home servicing pt with ADLs. No other needs to address at this time.  ? ?TOC will continue to assist with discharge needs. ? ? ?Final next level of care: Home w Home Health Services ?Barriers to Discharge: Barriers Resolved ? ? ?Patient Goals and CMS Choice ?  ?  ?Choice offered to / list presented to : Spouse ? ?Discharge Placement ?  ?           ?  ?  ?Name of family member notified: Evan Townsend ?Patient and family notified of of transfer: 10/18/21 ? ?Discharge Plan and Services ?  ?  ?           ?  ?  ?  ?  ?  ?HH Arranged: PT ?HH Agency: [facility] ?Date HH Agency Contacted: 10/18/21 ?Time HH Agency Contacted: 1029 ?Representative spoke with at [facility] Agency: Denyse Amass ? ?Social Determinants of Health (SDOH) Interventions ?  ? ? ?Readmission Risk Interventions ?No flowsheet data found. ? ? ? ? ?

## 2021-10-20 ENCOUNTER — Telehealth: Payer: Self-pay

## 2021-10-20 ENCOUNTER — Ambulatory Visit (INDEPENDENT_AMBULATORY_CARE_PROVIDER_SITE_OTHER): Payer: Medicaid Other

## 2021-10-20 ENCOUNTER — Telehealth: Payer: Self-pay | Admitting: Cardiovascular Disease

## 2021-10-20 DIAGNOSIS — M79675 Pain in left toe(s): Secondary | ICD-10-CM | POA: Diagnosis not present

## 2021-10-20 DIAGNOSIS — R001 Bradycardia, unspecified: Secondary | ICD-10-CM

## 2021-10-20 DIAGNOSIS — B351 Tinea unguium: Secondary | ICD-10-CM | POA: Diagnosis not present

## 2021-10-20 DIAGNOSIS — M79674 Pain in right toe(s): Secondary | ICD-10-CM | POA: Diagnosis not present

## 2021-10-20 NOTE — Telephone Encounter (Signed)
Transition Care Management Follow-up Telephone Call ?Date of discharge and from where: 10/18/2021 from [facility] ?How have you been since you were released from the hospital?  Spoke to patient wife, Meriam Sprague, and she stated that patient was doing well and they did not have any questions at this time.  ?Any questions or concerns? No ? ?Items Reviewed: ?Did the pt receive and understand the discharge instructions provided? Yes  ?Medications obtained and verified? Yes  ?Other? No  ?Any new allergies since your discharge? No  ?Dietary orders reviewed? No ?Do you have support at home? Yes  ? ?Functional Questionnaire: (I = Independent and D = Dependent) ?ADLs: I ? ?Bathing/Dressing- I ? ?Meal Prep- I ? ?Eating- I ? ?Maintaining continence- I ? ?Transferring/Ambulation- I ? ?Managing Meds- I ? ? ?Follow up appointments reviewed: ? ?PCP Hospital f/u appt confirmed? Yes  Scheduled to see Loura Pardon, MD on 11/10/2021 @ 03:00pm. ?Specialist Hospital f/u appt confirmed? Yes  Scheduled to see Helane Gunther, DPM on 10/23/2021 @ 01:45pm. ?Are transportation arrangements needed? No  ?If their condition worsens, is the pt aware to call PCP or go to the Emergency Dept.? Yes ?Was the patient provided with contact information for the PCP's office or ED? Yes ?Was to pt encouraged to call back with questions or concerns? Yes ? ?

## 2021-10-20 NOTE — Telephone Encounter (Signed)
DPR on file. Spoke wit the patients wife [place]. ?Advised her that the Zio At has been ordered and will be mailed to their home. ?It should be received by mid week this week. ?Zio AT is to be worn by the patient for 14 days. ?They should f/u the application and return instructions. ?Once received if any questions they can contact th e office. ?F/u appt scheduled with Dr. Okey Dupre on 11/26/21 @ 3 pm. ? ?Adv the patients wife to contact the office sooner if cardiac symptoms develop. ?Patients wife verbalized understanding and voiced appreciation for the call. ? ? ? ?

## 2021-10-20 NOTE — Telephone Encounter (Signed)
CCd triage message received from Dr. Mariah Milling with no recommendation from Dr. Mariah Milling. ? CCd msg included Dr. Gretel Acre consult note for the patients recent hospital admission. ? ?"- possible d/c today. Given nocturnal pause less than 3 seconds consider live heart monitor at discharge. MD to see" ? ?Patient Cardiologist is Dr. Okey Dupre. ?Zio AT ordered to be mailed to the patient home ?Ordered under Dr. Okey Dupre (will send him an update). ?

## 2021-10-23 ENCOUNTER — Ambulatory Visit: Payer: Medicaid Other | Admitting: Podiatry

## 2021-10-23 DIAGNOSIS — R001 Bradycardia, unspecified: Secondary | ICD-10-CM

## 2021-11-04 ENCOUNTER — Ambulatory Visit (INDEPENDENT_AMBULATORY_CARE_PROVIDER_SITE_OTHER): Payer: Medicaid Other | Admitting: Vascular Surgery

## 2021-11-10 ENCOUNTER — Ambulatory Visit: Payer: Medicaid Other | Admitting: Internal Medicine

## 2021-11-12 ENCOUNTER — Ambulatory Visit (INDEPENDENT_AMBULATORY_CARE_PROVIDER_SITE_OTHER): Payer: Medicaid Other | Admitting: Internal Medicine

## 2021-11-12 ENCOUNTER — Encounter: Payer: Self-pay | Admitting: Internal Medicine

## 2021-11-12 VITALS — BP 146/82 | HR 50 | Ht 64.02 in | Wt 145.0 lb

## 2021-11-12 DIAGNOSIS — E1169 Type 2 diabetes mellitus with other specified complication: Secondary | ICD-10-CM

## 2021-11-12 DIAGNOSIS — I639 Cerebral infarction, unspecified: Secondary | ICD-10-CM

## 2021-11-12 DIAGNOSIS — I10 Essential (primary) hypertension: Secondary | ICD-10-CM | POA: Diagnosis not present

## 2021-11-12 DIAGNOSIS — E785 Hyperlipidemia, unspecified: Secondary | ICD-10-CM

## 2021-11-12 LAB — MICROSCOPIC EXAMINATION
Bacteria, UA: NONE SEEN
Epithelial Cells (non renal): NONE SEEN /hpf (ref 0–10)
WBC, UA: NONE SEEN /hpf (ref 0–5)

## 2021-11-12 LAB — URINALYSIS, ROUTINE W REFLEX MICROSCOPIC
Bilirubin, UA: NEGATIVE
Ketones, UA: NEGATIVE
Leukocytes,UA: NEGATIVE
Nitrite, UA: NEGATIVE
Specific Gravity, UA: 1.025 (ref 1.005–1.030)
Urobilinogen, Ur: 0.2 mg/dL (ref 0.2–1.0)
pH, UA: 5.5 (ref 5.0–7.5)

## 2021-11-12 LAB — MICROALBUMIN, URINE WAIVED
Creatinine, Urine Waived: 50 mg/dL (ref 10–300)
Microalb, Ur Waived: 150 mg/L — ABNORMAL HIGH (ref 0–19)
Microalb/Creat Ratio: >300 mg/g — ABNORMAL HIGH (ref <30)

## 2021-11-12 LAB — BAYER DCA HB A1C WAIVED: HB A1C (BAYER DCA - WAIVED): 6.2 % — ABNORMAL HIGH (ref 4.8–5.6)

## 2021-11-12 NOTE — Progress Notes (Signed)
? ?BP (!) 146/82   Pulse (!) 50   Ht 5' 4.02" (1.626 m)   Wt 145 lb (65.8 kg)   SpO2 99%   BMI 24.88 kg/m?   ? ?Subjective:  ? ? Patient ID: Evan BURROLA Sr., male    DOB: [birth year], 66 y.o.   MRN: [id number] ? ?Chief Complaint  ?Patient presents with  ? Diabetes  ? Hyperlipidemia  ? ?Pt was admitted initally for bradycardia was placed on a heart monitor and is supposed to see Cards for such he was then admitted for AKI sec to ? And metformin was held. He was placed on farxiga instead and d/c/  ?Creat was  ? Latest Reference Range & Units 10/17/21 09:36 10/18/21 06:13  ?Creatinine 0.61 - 1.24 mg/dL 1.53 (H) 1.28 (H)  ?(H): Data is abnormally high ?HPI: ?Evan MARSALA Sr. is a 66 y.o. male ? ?Pts HR is still low at 50 pt is not symptomatic, no sob, cp, no dizziness.  ? ?Diabetes ?He presents for his follow-up (is on farxiga sec d/c metformin at hospital) diabetic visit. He has type 2 diabetes mellitus. Pertinent negatives for hypoglycemia include no headaches. Pertinent negatives for diabetes include no fatigue.  ?Hyperlipidemia ?This is a chronic problem.  ?Heart Problem ?This is a chronic (hr still low still at 50 was placed on eliquis.) problem. The current episode started 1 to 4 weeks ago. The problem occurs intermittently. Pertinent negatives include no abdominal pain, anorexia, arthralgias, change in bowel habit, fatigue, fever, headaches, joint swelling, rash, sore throat, swollen glands or urinary symptoms.  ? ?Chief Complaint  ?Patient presents with  ? Diabetes  ? Hyperlipidemia  ? ? ?Relevant past medical, surgical, family and social history reviewed and updated as indicated. Interim medical history since our last visit reviewed. ?Allergies and medications reviewed and updated. ? ?Review of Systems  ?Constitutional:  Negative for fatigue and fever.  ?HENT:  Negative for sore throat.   ?Gastrointestinal:  Negative for abdominal pain, anorexia and change in bowel habit.  ?Musculoskeletal:  Negative  for arthralgias and joint swelling.  ?Skin:  Negative for rash.  ?Neurological:  Negative for headaches.  ? ?Per HPI unless specifically indicated above ? ?   ?Objective:  ?  ?BP (!) 146/82   Pulse (!) 50   Ht 5' 4.02" (1.626 m)   Wt 145 lb (65.8 kg)   SpO2 99%   BMI 24.88 kg/m?   ?Wt Readings from Last 3 Encounters:  ?11/12/21 145 lb (65.8 kg)  ?10/16/21 145 lb 8.1 oz (66 kg)  ?10/16/21 145 lb 6.4 oz (66 kg)  ?  ?Physical Exam ?Vitals and nursing note reviewed.  ?Constitutional:   ?   General: He is not in acute distress. ?   Appearance: Normal appearance. He is not ill-appearing or diaphoretic.  ?HENT:  ?   Head: Normocephalic and atraumatic.  ?   Right Ear: Tympanic membrane and external ear normal. There is no impacted cerumen.  ?   Left Ear: External ear normal.  ?   Nose: No congestion or rhinorrhea.  ?   Mouth/Throat:  ?   Pharynx: No oropharyngeal exudate or posterior oropharyngeal erythema.  ?Eyes:  ?   Conjunctiva/sclera: Conjunctivae normal.  ?   Pupils: Pupils are equal, round, and reactive to light.  ?Cardiovascular:  ?   Rate and Rhythm: Normal rate and regular rhythm.  ?   Heart sounds: No murmur heard. ?  No friction rub. No gallop.  ?Pulmonary:  ?  Effort: No respiratory distress.  ?   Breath sounds: No stridor. No wheezing or rhonchi.  ?Chest:  ?   Chest wall: No tenderness.  ?Abdominal:  ?   General: Abdomen is flat. Bowel sounds are normal.  ?   Palpations: Abdomen is soft. There is no mass.  ?   Tenderness: There is no abdominal tenderness.  ?Musculoskeletal:  ?   Cervical back: Normal range of motion and neck supple. No rigidity or tenderness.  ?   Left lower leg: No edema.  ?Neurological:  ?   Mental Status: He is alert.  ?   Sensory: No sensory deficit.  ?   Motor: Weakness present.  ?   Gait: Gait abnormal.  ? ? ?Results for orders placed or performed during the hospital encounter of 10/16/21  ?Resp Panel by RT-PCR (Flu A&B, Covid) STOOL  ? Specimen: STOOL; Nasopharyngeal(NP) swabs in  vial transport medium  ?Result Value Ref Range  ? SARS Coronavirus 2 by RT PCR NEGATIVE NEGATIVE  ? Influenza A by PCR NEGATIVE NEGATIVE  ? Influenza B by PCR NEGATIVE NEGATIVE  ?C Difficile Quick Screen w PCR reflex  ? Specimen: STOOL  ?Result Value Ref Range  ? C Diff antigen NEGATIVE NEGATIVE  ? C Diff toxin NEGATIVE NEGATIVE  ? C Diff interpretation No C. difficile detected.   ?Gastrointestinal Panel by PCR , Stool  ? Specimen: STOOL  ?Result Value Ref Range  ? Campylobacter species NOT DETECTED NOT DETECTED  ? Plesimonas shigelloides NOT DETECTED NOT DETECTED  ? Salmonella species NOT DETECTED NOT DETECTED  ? Yersinia enterocolitica NOT DETECTED NOT DETECTED  ? Vibrio species NOT DETECTED NOT DETECTED  ? Vibrio cholerae NOT DETECTED NOT DETECTED  ? Enteroaggregative E coli (EAEC) NOT DETECTED NOT DETECTED  ? Enteropathogenic E coli (EPEC) NOT DETECTED NOT DETECTED  ? Enterotoxigenic E coli (ETEC) NOT DETECTED NOT DETECTED  ? Shiga like toxin producing E coli (STEC) NOT DETECTED NOT DETECTED  ? Shigella/Enteroinvasive E coli (EIEC) NOT DETECTED NOT DETECTED  ? Cryptosporidium NOT DETECTED NOT DETECTED  ? Cyclospora cayetanensis NOT DETECTED NOT DETECTED  ? Entamoeba histolytica NOT DETECTED NOT DETECTED  ? Giardia lamblia NOT DETECTED NOT DETECTED  ? Adenovirus F40/41 NOT DETECTED NOT DETECTED  ? Astrovirus NOT DETECTED NOT DETECTED  ? Norovirus GI/GII NOT DETECTED NOT DETECTED  ? Rotavirus A NOT DETECTED NOT DETECTED  ? Sapovirus (I, II, IV, and V) NOT DETECTED NOT DETECTED  ?Basic metabolic panel  ?Result Value Ref Range  ? Sodium 141 135 - 145 mmol/L  ? Potassium 3.9 3.5 - 5.1 mmol/L  ? Chloride 113 (H) 98 - 111 mmol/L  ? CO2 21 (L) 22 - 32 mmol/L  ? Glucose, Bld 104 (H) 70 - 99 mg/dL  ? BUN 31 (H) 8 - 23 mg/dL  ? Creatinine, Ser 1.40 (H) 0.61 - 1.24 mg/dL  ? Calcium 8.9 8.9 - 10.3 mg/dL  ? GFR, Estimated 56 (L) >60 mL/min  ? Anion gap 7 5 - 15  ?CBC  ?Result Value Ref Range  ? WBC 4.8 4.0 - 10.5 K/uL  ? RBC  3.48 (L) 4.22 - 5.81 MIL/uL  ? Hemoglobin 9.6 (L) 13.0 - 17.0 g/dL  ? HCT 31.0 (L) 39.0 - 52.0 %  ? MCV 89.1 80.0 - 100.0 fL  ? MCH 27.6 26.0 - 34.0 pg  ? MCHC 31.0 30.0 - 36.0 g/dL  ? RDW 17.2 (H) 11.5 - 15.5 %  ? Platelets 205 150 - 400  K/uL  ? nRBC 0.6 (H) 0.0 - 0.2 %  ?TSH  ?Result Value Ref Range  ? TSH 0.867 0.350 - 4.500 uIU/mL  ?T4, free  ?Result Value Ref Range  ? Free T4 1.09 0.61 - 1.12 ng/dL  ?Magnesium  ?Result Value Ref Range  ? Magnesium 2.1 1.7 - 2.4 mg/dL  ?HIV Antibody (routine testing w rflx)  ?Result Value Ref Range  ? HIV Screen 4th Generation wRfx Non Reactive Non Reactive  ?Hemoglobin A1c  ?Result Value Ref Range  ? Hgb A1c MFr Bld 7.5 (H) 4.8 - 5.6 %  ? Mean Plasma Glucose 169 mg/dL  ?APTT  ?Result Value Ref Range  ? aPTT 38 (H) 24 - 36 seconds  ?Glucose, capillary  ?Result Value Ref Range  ? Glucose-Capillary 117 (H) 70 - 99 mg/dL  ?CBC  ?Result Value Ref Range  ? WBC 4.5 4.0 - 10.5 K/uL  ? RBC 3.35 (L) 4.22 - 5.81 MIL/uL  ? Hemoglobin 9.5 (L) 13.0 - 17.0 g/dL  ? HCT 29.3 (L) 39.0 - 52.0 %  ? MCV 87.5 80.0 - 100.0 fL  ? MCH 28.4 26.0 - 34.0 pg  ? MCHC 32.4 30.0 - 36.0 g/dL  ? RDW 17.4 (H) 11.5 - 15.5 %  ? Platelets 210 150 - 400 K/uL  ? nRBC 0.0 0.0 - 0.2 %  ?Basic metabolic panel  ?Result Value Ref Range  ? Sodium 142 135 - 145 mmol/L  ? Potassium 3.7 3.5 - 5.1 mmol/L  ? Chloride 117 (H) 98 - 111 mmol/L  ? CO2 20 (L) 22 - 32 mmol/L  ? Glucose, Bld 122 (H) 70 - 99 mg/dL  ? BUN 28 (H) 8 - 23 mg/dL  ? Creatinine, Ser 1.53 (H) 0.61 - 1.24 mg/dL  ? Calcium 8.6 (L) 8.9 - 10.3 mg/dL  ? GFR, Estimated 50 (L) >60 mL/min  ? Anion gap 5 5 - 15  ?Glucose, capillary  ?Result Value Ref Range  ? Glucose-Capillary 83 70 - 99 mg/dL  ?Glucose, capillary  ?Result Value Ref Range  ? Glucose-Capillary 123 (H) 70 - 99 mg/dL  ?Ferritin  ?Result Value Ref Range  ? Ferritin 335 24 - 336 ng/mL  ?Glucose, capillary  ?Result Value Ref Range  ? Glucose-Capillary 146 (H) 70 - 99 mg/dL  ?Basic metabolic panel  ?Result  Value Ref Range  ? Sodium 147 (H) 135 - 145 mmol/L  ? Potassium 3.6 3.5 - 5.1 mmol/L  ? Chloride 117 (H) 98 - 111 mmol/L  ? CO2 22 22 - 32 mmol/L  ? Glucose, Bld 74 70 - 99 mg/dL  ? BUN 21 8 - 23 mg/dL  ? Creatin

## 2021-11-13 LAB — LIPID PANEL
Chol/HDL Ratio: 2.5 ratio (ref 0.0–5.0)
Cholesterol, Total: 134 mg/dL (ref 100–199)
HDL: 53 mg/dL (ref >39)
LDL Chol Calc (NIH): 52 mg/dL (ref 0–99)
Triglycerides: 176 mg/dL — ABNORMAL HIGH (ref 0–149)
VLDL Cholesterol Cal: 29 mg/dL (ref 5–40)

## 2021-11-13 LAB — BASIC METABOLIC PANEL
BUN/Creatinine Ratio: 19 (ref 10–24)
BUN: 24 mg/dL (ref 8–27)
CO2: 24 mmol/L (ref 20–29)
Calcium: 9.1 mg/dL (ref 8.6–10.2)
Chloride: 109 mmol/L — ABNORMAL HIGH (ref 96–106)
Creatinine, Ser: 1.27 mg/dL (ref 0.76–1.27)
Glucose: 153 mg/dL — ABNORMAL HIGH (ref 70–99)
Potassium: 3.9 mmol/L (ref 3.5–5.2)
Sodium: 145 mmol/L — ABNORMAL HIGH (ref 134–144)
eGFR: 63 mL/min/{1.73_m2} (ref >59)

## 2021-11-13 LAB — VITAMIN B12: Vitamin B-12: 866 pg/mL (ref 232–1245)

## 2021-11-17 ENCOUNTER — Other Ambulatory Visit: Payer: Self-pay

## 2021-11-17 NOTE — Progress Notes (Signed)
[facility] Pharmacist called stating they are under audit and wanted to make [place] aware that the prescription for Glucagon (09/26/2020) should have been amended to reflect proper sig of: Inject into the skin PRN as needed for episodic hyperglycemia.  ?

## 2021-11-18 ENCOUNTER — Encounter: Payer: Self-pay | Admitting: Internal Medicine

## 2021-11-19 ENCOUNTER — Other Ambulatory Visit: Payer: Self-pay | Admitting: Nurse Practitioner

## 2021-11-19 DIAGNOSIS — E1143 Type 2 diabetes mellitus with diabetic autonomic (poly)neuropathy: Secondary | ICD-10-CM

## 2021-11-19 NOTE — Progress Notes (Signed)
?Cardiology Office Note:   ? ?Date:  11/20/2021  ? ?ID:  Evan Cones Sr., DOB 09/22/1955, MRN [id number] ? ?PCP:  Charlynne Cousins, MD  ?[facility] Cardiologist:  Nelva Bush, MD  ?[facility] Electrophysiologist:  None  ? ?Referring MD: Charlynne Cousins, MD  ? ?Chief Complaint: post-hospital follow-up ? ?History of Present Illness:   ? ?Evan BUNTE Sr. is a 66 y.o. male with a hx of strokes, left hemiparesis, paroxysmal atrial fibrillation, hypertension, hyperlipidemia, poorly controlled diabetes, cerebrovascular and carotid disease, and tobacco abuse, who presents for hospital follow-up. ? ?In January 2021, he was admitted to [facility] with chest pain and hyperglycemia.  Echocardiogram showed normal LV function.  Stress testing was nonischemic.  The day after discharge, he presented to the [facility] ED secondary to dysarthria, left facial droop, and worsening left-sided weakness.  MRI showed acute stroke in the left paramedian pons and right cerebellar hemisphere.  He subsequently underwent outpatient monitoring which showed paroxysmal atrial fibrillation and he was placed on Eliquis therapy in March 2021. ? ?He was recently admitted for sinus bradycardia with heart rates in the 30s. Metoprolol was held on admission. Required IV glucagon, calcium, and dopamine. He had overnight pause 2.64 seconds. Heart rates improved off BB. No indication for pacemaker.  ? ?Heart monitor at discharge showed predominately NSR with Mobitz stype 1 second degree AV block.  ? ?Today, the patient reports he has been doing well at home. EKG today shows NSR at 62bpm with RBBB and 1st degree AV block. He reports some generalized weakness at home. He saw PCP and she ordered PT. Has some orthostatic symptoms upon standing  with dizziness and lightheadedness. No chest pain, shortness of breath, LL, orthopnea or pnd. BP is high, but he didn't have medications this morning. BP at home 130/90s.  ? ?Past Medical History:  ?Diagnosis  Date  ? Carotid arterial disease ([place])   ? a. 08/2018 Carotid U/S: min-mod RICA atherosclerosis w/o hemodynamically significant stenosis. Nl LICA.  ? Diabetes 1.5, managed as type 2 ([place])   ? Diastolic dysfunction   ? a. 08/2018 Echo: EF 65%. No rwma. Gr1 DD. Mild MR.  ? Diastolic dysfunction   ? a. 08/2019 Echo: EF 55-60%, Gr1 DD. No rwma. Mild MR. RVSP 37.72mHg.  ? Hypercholesterolemia   ? Hypertension   ? PAF (paroxysmal atrial fibrillation) ([place]   ? a. 10/2019 Event Monitor: PAF; b. CHA2DS2VASc = 5-->Eliquis.  ? Poorly controlled diabetes mellitus ([place]   ? a. 04/2019 A1c 13.8.  ? [place]   ? a. 10/2016 MRI/A: Acute 543mR thalamic infarct, ? subacute infarct of R corona radiata; b. 08/2017 MRI/A: Acute 58m76materal L thalamic infarct. Other more remote lacunar infarcts of thalami bilat. Small vessel dzs; c. 08/2018 MRI/A: Acute lacunar infarct of the post limb of R internal capsule; d. 08/2019 MRI Acute CVA of L paramedian pons adn R cerebellar hemisphere.  ? Tobacco abuse   ? ? ?Past Surgical History:  ?Procedure Laterality Date  ? ESOPHAGOGASTRODUODENOSCOPY (EGD) WITH PROPOFOL N/A 04/26/2019  ? Procedure: ESOPHAGOGASTRODUODENOSCOPY (EGD) WITH PROPOFOL;  Surgeon: VanLin LandsmanD;  [facility];  Service: Gastroenterology;  Laterality: N/A;  ? NO PAST SURGERIES    ? ? ?Current Medications: ?Current Meds  ?Medication Sig  ? ACCU-CHEK GUIDE test strip Check fsbs once daily  ? Accu-Chek Softclix Lancets lancets SMARTSIG:Topical  ? albuterol (VENTOLIN HFA) 108 (90 Base) MCG/ACT inhaler Inhale 2 puffs into the lungs every 6 (  six) hours as needed for wheezing or shortness of breath.  ? amLODipine (NORVASC) 10 MG tablet Take 1 tablet (10 mg total) by mouth daily.  ? apixaban (ELIQUIS) 5 MG TABS tablet Take 1 tablet (5 mg total) by mouth 2 (two) times daily.  ? atorvastatin (LIPITOR) 80 MG tablet Take 1 tablet (80 mg total) by mouth daily.  ? baclofen (LIORESAL) 10 MG tablet Take 1 tablet (10  mg total) by mouth 3 (three) times daily.  ? Blood Glucose Monitoring Suppl (FIFTY50 GLUCOSE METER 2.0) w/Device KIT Use as instructed  ? Blood Glucose Monitoring Suppl KIT Accucheck Guide with lancets and test strips #100 with one refill.  Test blood sugar twice daily. DX Code E11.65 ; Z79.4  ? Continuous Blood Gluc Sensor (FREESTYLE LIBRE 2 SENSOR) MISC 1 each by Does not apply route every 14 (fourteen) days.  ? dapagliflozin propanediol (FARXIGA) 10 MG TABS tablet Take 1 tablet (10 mg total) by mouth daily before breakfast. In place of glipizide  ? fexofenadine (ALLEGRA ALLERGY) 180 MG tablet Take 1 tablet (180 mg total) by mouth daily.  ? Incontinence Supply Disposable (COMFORT PROTECT ADULT DIAPER/L) MISC 1 Units by Does not apply route daily. Use 3-4 a day as needed  ? Insulin Pen Needle 32G X 6 MM MISC Daily  ? irbesartan (AVAPRO) 300 MG tablet   ? ketoconazole (NIZORAL) 2 % cream Apply 1 application topically daily.  ? melatonin 1 MG TABS tablet TAKE 1 TABLET BY MOUTH AT BEDTIME  ? metoCLOPramide (REGLAN) 5 MG tablet TAKE 1 TABLET BY MOUTH 3 TIMES DAILY BEFORE MEALS  ? Misc. Devices MISC One pair of Compression stockings ? ?Hemiplegia and hemiparesis following cerebral infarction affecting left non-dominant side (HCC)  - Primary ?Codes: F02.637 ?Lower extremity edema  ?Codes: R60.0 ?Type 2 diabetes mellitus with hyperglycemia, with long-term current use of insulin ([place])  ?Codes: E11.65, Z79.4  ? nitroGLYCERIN (NITROSTAT) 0.4 MG SL tablet Place 1 tablet (0.4 mg total) under the tongue every 5 (five) minutes as needed for chest pain.  ? omeprazole (PRILOSEC) 40 MG capsule TAKE 1 CAPSULE BY MOUTH IN THE MORNING AND AT BEDTIME  ? tamsulosin (FLOMAX) 0.4 MG CAPS capsule Take 1 capsule (0.4 mg total) by mouth daily.  ?  ? ?Allergies:   Patient has no known allergies.  ? ?Social History  ? ?Socioeconomic History  ? Marital status: Married  ?  Spouse name: beverly Sui  ? Number of children: 5  ? Years of  education: Not on file  ? Highest education level: Not on file  ?Occupational History  ? Occupation: unemployed  ?Tobacco Use  ? Smoking status: Former  ?  Packs/day: 1.00  ?  Years: 38.00  ?  Pack years: 38.00  ?  Types: Cigarettes  ?  Start date: 67  ? Smokeless tobacco: Never  ?Vaping Use  ? Vaping Use: Never used  ?Substance and Sexual Activity  ? Alcohol use: Not Currently  ?  Alcohol/week: 1.0 standard drink  ?  Types: 1 Cans of beer per week  ?  Comment: previously drank but nothing in 1-2 yrs (08/2019).  ? Drug use: Not Currently  ?  Types: Cocaine, Marijuana  ?  Comment: prev used cocaine/marijuana but none x 1-2 yrs (08/2019).  ? Sexual activity: Yes  ?Other Topics Concern  ? Not on file  ?Social History Narrative  ? Lives in [place] with his wife.  Uses a cane when ambulating.  Can walk about 25 yds  prior to having to rest - says he stumbles if he has to work further than that.  Usually uses a scooter to get around stores.  ? ?Social Determinants of Health  ? ?Financial Resource Strain: Not on file  ?Food Insecurity: Not on file  ?Transportation Needs: Not on file  ?Physical Activity: Not on file  ?Stress: Not on file  ?Social Connections: Not on file  ?  ? ?Family History: ?The patient's family history includes Diabetes in his father; Heart attack in his father; Hypertension in his father and mother; Stroke in his mother. ? ?ROS:   ?Please see the history of present illness.    ? All other systems reviewed and are negative. ? ?EKGs/Labs/Other Studies Reviewed:   ? ?The following studies were reviewed today: ? ?Heart monitor 11/14/21 ?The patient was monitored for 14 days. ?The predominant rhythm was sinus with an average rate of 56 bpm (range 27-126 bpm). ?There were rare PACs and PVCs. ?Episodes of Mobitz type I second-degree AV block and 2:1 AV block were observed. ?No prolonged pauses (greater than 3 seconds) was observed. ?Patient triggered events corresponded to sinus rhythm and PVCs. ?   ?Predominantly sinus rhythm with episodes of Mobitz type I second-degree AV block and 2:1 AV block. ? ?Echo 10/17/21 ? 1. Left ventricular ejection fraction, by estimation, is 50 to 55%. The  ?left ventricle has low norma

## 2021-11-20 ENCOUNTER — Encounter: Payer: Self-pay | Admitting: Medical

## 2021-11-20 ENCOUNTER — Ambulatory Visit: Payer: Medicaid Other | Admitting: Medical

## 2021-11-20 VITALS — BP 164/104 | HR 64 | Ht 66.0 in | Wt 142.0 lb

## 2021-11-20 DIAGNOSIS — I441 Atrioventricular block, second degree: Secondary | ICD-10-CM

## 2021-11-20 DIAGNOSIS — I10 Essential (primary) hypertension: Secondary | ICD-10-CM

## 2021-11-20 DIAGNOSIS — I480 Paroxysmal atrial fibrillation: Secondary | ICD-10-CM | POA: Diagnosis not present

## 2021-11-20 DIAGNOSIS — R001 Bradycardia, unspecified: Secondary | ICD-10-CM | POA: Diagnosis not present

## 2021-11-20 DIAGNOSIS — I639 Cerebral infarction, unspecified: Secondary | ICD-10-CM | POA: Diagnosis not present

## 2021-11-20 NOTE — Patient Instructions (Signed)
Medication Instructions:  ?Your physician recommends that you continue on your current medications as directed. Please refer to the Current Medication list given to you today. ? ?*If you need a refill on your cardiac medications before your next appointment, please call your pharmacy* ? ? ?Lab Work: ?None ordered ?If you have labs (blood work) drawn today and your tests are completely normal, you will receive your results only by: ?MyChart Message (if you have MyChart) OR ?A paper copy in the mail ?If you have any lab test that is abnormal or we need to change your treatment, we will call you to review the results. ? ? ?Testing/Procedures: ?None ordered ? ? ?Follow-Up: ?At [facility], you and your health needs are our priority.  As part of our continuing mission to provide you with exceptional heart care, we have created designated Provider Care Teams.  These Care Teams include your primary Cardiologist (physician) and Advanced Practice Providers (APPs -  Physician Assistants and Nurse Practitioners) who all work together to provide you with the care you need, when you need it. ? ?We recommend signing up for the patient portal called "MyChart".  Sign up information is provided on this After Visit Summary.  MyChart is used to connect with patients for Virtual Visits (Telemedicine).  Patients are able to view lab/test results, encounter notes, upcoming appointments, etc.  Non-urgent messages can be sent to your provider as well.   ?To learn more about what you can do with MyChart, go to [email or web address].   ? ?Your next appointment:   ?3 month(s) ? ?The format for your next appointment:   ?In Person ? ?Provider:   ?You may see Dr. Wyline Mood or Dr. Diona Browner  ? ? ?Other Instructions ?N/A ? ?

## 2021-11-26 ENCOUNTER — Emergency Department: Payer: Medicaid Other

## 2021-11-26 ENCOUNTER — Other Ambulatory Visit: Payer: Self-pay

## 2021-11-26 ENCOUNTER — Institutional Professional Consult (permissible substitution): Payer: Medicaid Other | Admitting: Internal Medicine

## 2021-11-26 ENCOUNTER — Inpatient Hospital Stay
Admission: EM | Admit: 2021-11-26 | Discharge: 2021-11-28 | DRG: 871 | Disposition: A | Discharge status: Home / Self Care | Payer: Medicaid Other | Attending: Internal Medicine | Admitting: Internal Medicine

## 2021-11-26 DIAGNOSIS — I482 Chronic atrial fibrillation, unspecified: Secondary | ICD-10-CM

## 2021-11-26 DIAGNOSIS — A419 Sepsis, unspecified organism: Secondary | ICD-10-CM

## 2021-11-26 DIAGNOSIS — Z8249 Family history of ischemic heart disease and other diseases of the circulatory system: Secondary | ICD-10-CM | POA: Diagnosis not present

## 2021-11-26 DIAGNOSIS — Z823 Family history of stroke: Secondary | ICD-10-CM | POA: Diagnosis not present

## 2021-11-26 DIAGNOSIS — I441 Atrioventricular block, second degree: Secondary | ICD-10-CM

## 2021-11-26 DIAGNOSIS — Z833 Family history of diabetes mellitus: Secondary | ICD-10-CM | POA: Diagnosis not present

## 2021-11-26 DIAGNOSIS — I69354 Hemiplegia and hemiparesis following cerebral infarction affecting left non-dominant side: Secondary | ICD-10-CM | POA: Diagnosis not present

## 2021-11-26 DIAGNOSIS — D72819 Decreased white blood cell count, unspecified: Secondary | ICD-10-CM | POA: Diagnosis not present

## 2021-11-26 DIAGNOSIS — E1165 Type 2 diabetes mellitus with hyperglycemia: Secondary | ICD-10-CM | POA: Diagnosis not present

## 2021-11-26 DIAGNOSIS — I700 Atherosclerosis of aorta: Secondary | ICD-10-CM | POA: Diagnosis not present

## 2021-11-26 DIAGNOSIS — Z20822 Contact with and (suspected) exposure to covid-19: Secondary | ICD-10-CM | POA: Diagnosis not present

## 2021-11-26 DIAGNOSIS — R4781 Slurred speech: Secondary | ICD-10-CM | POA: Diagnosis not present

## 2021-11-26 DIAGNOSIS — T68XXXA Hypothermia, initial encounter: Secondary | ICD-10-CM | POA: Diagnosis present

## 2021-11-26 DIAGNOSIS — I480 Paroxysmal atrial fibrillation: Secondary | ICD-10-CM | POA: Diagnosis present

## 2021-11-26 DIAGNOSIS — J189 Pneumonia, unspecified organism: Secondary | ICD-10-CM | POA: Diagnosis present

## 2021-11-26 DIAGNOSIS — E876 Hypokalemia: Secondary | ICD-10-CM | POA: Diagnosis present

## 2021-11-26 DIAGNOSIS — Z87891 Personal history of nicotine dependence: Secondary | ICD-10-CM | POA: Diagnosis not present

## 2021-11-26 DIAGNOSIS — Z7901 Long term (current) use of anticoagulants: Secondary | ICD-10-CM

## 2021-11-26 DIAGNOSIS — R001 Bradycardia, unspecified: Secondary | ICD-10-CM

## 2021-11-26 DIAGNOSIS — Z8673 Personal history of transient ischemic attack (TIA), and cerebral infarction without residual deficits: Secondary | ICD-10-CM

## 2021-11-26 DIAGNOSIS — E1151 Type 2 diabetes mellitus with diabetic peripheral angiopathy without gangrene: Secondary | ICD-10-CM | POA: Diagnosis not present

## 2021-11-26 DIAGNOSIS — Z794 Long term (current) use of insulin: Secondary | ICD-10-CM

## 2021-11-26 DIAGNOSIS — R7989 Other specified abnormal findings of blood chemistry: Secondary | ICD-10-CM | POA: Diagnosis present

## 2021-11-26 DIAGNOSIS — Z79899 Other long term (current) drug therapy: Secondary | ICD-10-CM | POA: Diagnosis not present

## 2021-11-26 DIAGNOSIS — I10 Essential (primary) hypertension: Secondary | ICD-10-CM | POA: Diagnosis present

## 2021-11-26 DIAGNOSIS — R42 Dizziness and giddiness: Secondary | ICD-10-CM | POA: Diagnosis present

## 2021-11-26 DIAGNOSIS — N179 Acute kidney failure, unspecified: Secondary | ICD-10-CM | POA: Diagnosis present

## 2021-11-26 DIAGNOSIS — E7800 Pure hypercholesterolemia, unspecified: Secondary | ICD-10-CM | POA: Diagnosis present

## 2021-11-26 DIAGNOSIS — N400 Enlarged prostate without lower urinary tract symptoms: Secondary | ICD-10-CM | POA: Diagnosis not present

## 2021-11-26 DIAGNOSIS — N3289 Other specified disorders of bladder: Secondary | ICD-10-CM | POA: Diagnosis not present

## 2021-11-26 HISTORY — DX: Pneumonia, unspecified organism: J18.9

## 2021-11-26 HISTORY — DX: Sepsis, unspecified organism: A41.9

## 2021-11-26 LAB — COMPREHENSIVE METABOLIC PANEL
ALT: 71 U/L — ABNORMAL HIGH (ref 0–44)
AST: 43 U/L — ABNORMAL HIGH (ref 15–41)
Albumin: 3.3 g/dL — ABNORMAL LOW (ref 3.5–5.0)
Alkaline Phosphatase: 181 U/L — ABNORMAL HIGH (ref 38–126)
Anion gap: 7 (ref 5–15)
BUN: 21 mg/dL (ref 8–23)
CO2: 26 mmol/L (ref 22–32)
Calcium: 9.2 mg/dL (ref 8.9–10.3)
Chloride: 108 mmol/L (ref 98–111)
Creatinine, Ser: 1.57 mg/dL — ABNORMAL HIGH (ref 0.61–1.24)
GFR, Estimated: 49 mL/min — ABNORMAL LOW (ref >60)
Glucose, Bld: 150 mg/dL — ABNORMAL HIGH (ref 70–99)
Potassium: 3.4 mmol/L — ABNORMAL LOW (ref 3.5–5.1)
Sodium: 141 mmol/L (ref 135–145)
Total Bilirubin: 0.5 mg/dL (ref 0.3–1.2)
Total Protein: 7.5 g/dL (ref 6.5–8.1)

## 2021-11-26 LAB — URINALYSIS, COMPLETE (UACMP) WITH MICROSCOPIC
Bilirubin Urine: NEGATIVE
Glucose, UA: >=500 mg/dL — AB
Ketones, ur: NEGATIVE mg/dL
Leukocytes,Ua: NEGATIVE
Nitrite: NEGATIVE
Protein, ur: >=300 mg/dL — AB
Specific Gravity, Urine: 1.011 (ref 1.005–1.030)
Squamous Epithelial / HPF: NONE SEEN (ref 0–5)
pH: 5 (ref 5.0–8.0)

## 2021-11-26 LAB — CBC WITH DIFFERENTIAL/PLATELET
Abs Immature Granulocytes: 0.01 10*3/uL (ref 0.00–0.07)
Basophils Absolute: 0 10*3/uL (ref 0.0–0.1)
Basophils Relative: 0 %
Eosinophils Absolute: 0 10*3/uL (ref 0.0–0.5)
Eosinophils Relative: 1 %
HCT: 30.9 % — ABNORMAL LOW (ref 39.0–52.0)
Hemoglobin: 9.5 g/dL — ABNORMAL LOW (ref 13.0–17.0)
Immature Granulocytes: 0 %
Lymphocytes Relative: 40 %
Lymphs Abs: 1.2 10*3/uL (ref 0.7–4.0)
MCH: 27.4 pg (ref 26.0–34.0)
MCHC: 30.7 g/dL (ref 30.0–36.0)
MCV: 89 fL (ref 80.0–100.0)
Monocytes Absolute: 0.3 10*3/uL (ref 0.1–1.0)
Monocytes Relative: 9 %
Neutro Abs: 1.6 10*3/uL — ABNORMAL LOW (ref 1.7–7.7)
Neutrophils Relative %: 50 %
Platelets: 221 10*3/uL (ref 150–400)
RBC: 3.47 MIL/uL — ABNORMAL LOW (ref 4.22–5.81)
RDW: 16.8 % — ABNORMAL HIGH (ref 11.5–15.5)
WBC: 3.1 10*3/uL — ABNORMAL LOW (ref 4.0–10.5)
nRBC: 0 % (ref 0.0–0.2)

## 2021-11-26 LAB — PROTIME-INR
INR: 1.2 (ref 0.8–1.2)
Prothrombin Time: 14.6 seconds (ref 11.4–15.2)

## 2021-11-26 LAB — RESP PANEL BY RT-PCR (FLU A&B, COVID) ARPGX2
Influenza A by PCR: NEGATIVE
Influenza B by PCR: NEGATIVE
SARS Coronavirus 2 by RT PCR: NEGATIVE

## 2021-11-26 LAB — PROCALCITONIN: Procalcitonin: <0.1 ng/mL

## 2021-11-26 LAB — APTT: aPTT: 39 seconds — ABNORMAL HIGH (ref 24–36)

## 2021-11-26 LAB — LACTIC ACID, PLASMA: Lactic Acid, Venous: 1.1 mmol/L (ref 0.5–1.9)

## 2021-11-26 LAB — MAGNESIUM: Magnesium: 2 mg/dL (ref 1.7–2.4)

## 2021-11-26 LAB — CBG MONITORING, ED
Glucose-Capillary: 96 mg/dL (ref 70–99)
Glucose-Capillary: 104 mg/dL — ABNORMAL HIGH (ref 70–99)

## 2021-11-26 LAB — GLUCOSE, CAPILLARY: Glucose-Capillary: 84 mg/dL (ref 70–99)

## 2021-11-26 MED ORDER — SODIUM CHLORIDE 0.9 % IV SOLN
2.0000 g | INTRAVENOUS | Status: DC
  Administered 2021-11-26 – 2021-11-27 (×2): 2 g via INTRAVENOUS
  Filled 2021-11-26 (×2): qty 20

## 2021-11-26 MED ORDER — SODIUM CHLORIDE 0.9 % IV BOLUS (SEPSIS)
1000.0000 mL | Freq: Once | INTRAVENOUS | Status: AC
  Administered 2021-11-26: 1000 mL via INTRAVENOUS

## 2021-11-26 MED ORDER — PANTOPRAZOLE SODIUM 40 MG PO TBEC
40.0000 mg | DELAYED_RELEASE_TABLET | Freq: Two times a day (BID) | ORAL | Status: DC
  Administered 2021-11-26 – 2021-11-28 (×4): 40 mg via ORAL
  Filled 2021-11-26 (×4): qty 1

## 2021-11-26 MED ORDER — VANCOMYCIN HCL IN DEXTROSE 1-5 GM/200ML-% IV SOLN: 1000.0000 mg | Freq: Once | INTRAVENOUS | Status: DC

## 2021-11-26 MED ORDER — ADULT MULTIVITAMIN W/MINERALS CH
1.0000 | ORAL_TABLET | Freq: Every day | ORAL | Status: DC
  Administered 2021-11-26 – 2021-11-28 (×3): 1 via ORAL
  Filled 2021-11-26 (×3): qty 1

## 2021-11-26 MED ORDER — SODIUM CHLORIDE 0.9 % IV SOLN
2.0000 g | Freq: Once | INTRAVENOUS | Status: AC
  Administered 2021-11-26: 2 g via INTRAVENOUS
  Filled 2021-11-26: qty 12.5

## 2021-11-26 MED ORDER — INSULIN ASPART 100 UNIT/ML IJ SOLN
0.0000 [IU] | INTRAMUSCULAR | Status: DC
  Administered 2021-11-27: 2 [IU] via SUBCUTANEOUS
  Filled 2021-11-26: qty 1

## 2021-11-26 MED ORDER — VANCOMYCIN HCL 10 G IV SOLR
1500.0000 mg | Freq: Once | INTRAVENOUS | Status: AC
  Administered 2021-11-26: 1500 mg via INTRAVENOUS
  Filled 2021-11-26: qty 1545

## 2021-11-26 MED ORDER — ATORVASTATIN CALCIUM 80 MG PO TABS
80.0000 mg | ORAL_TABLET | Freq: Every evening | ORAL | Status: DC
  Administered 2021-11-27: 80 mg via ORAL
  Filled 2021-11-26: qty 1

## 2021-11-26 MED ORDER — ACETAMINOPHEN 650 MG RE SUPP: 650.0000 mg | Freq: Four times a day (QID) | RECTAL | Status: DC | PRN

## 2021-11-26 MED ORDER — ALBUTEROL SULFATE (2.5 MG/3ML) 0.083% IN NEBU: 3.0000 mL | INHALATION_SOLUTION | Freq: Four times a day (QID) | RESPIRATORY_TRACT | Status: DC | PRN

## 2021-11-26 MED ORDER — ACETAMINOPHEN 325 MG PO TABS
650.0000 mg | ORAL_TABLET | Freq: Four times a day (QID) | ORAL | Status: DC | PRN
  Administered 2021-11-27 – 2021-11-28 (×2): 650 mg via ORAL
  Filled 2021-11-26 (×2): qty 2

## 2021-11-26 MED ORDER — TAMSULOSIN HCL 0.4 MG PO CAPS
0.4000 mg | ORAL_CAPSULE | Freq: Every day | ORAL | Status: DC
  Administered 2021-11-27 – 2021-11-28 (×2): 0.4 mg via ORAL
  Filled 2021-11-26 (×2): qty 1

## 2021-11-26 MED ORDER — SODIUM CHLORIDE 0.9 % IV SOLN
500.0000 mg | INTRAVENOUS | Status: DC
  Administered 2021-11-26 – 2021-11-27 (×2): 500 mg via INTRAVENOUS
  Filled 2021-11-26 (×2): qty 5
  Filled 2021-11-26: qty 500

## 2021-11-26 MED ORDER — LACTATED RINGERS IV SOLN: INTRAVENOUS | Status: DC

## 2021-11-26 MED ORDER — SODIUM CHLORIDE 0.9% FLUSH
3.0000 mL | Freq: Two times a day (BID) | INTRAVENOUS | Status: DC
  Administered 2021-11-26 – 2021-11-27 (×3): 3 mL via INTRAVENOUS

## 2021-11-26 MED ORDER — APIXABAN 5 MG PO TABS
5.0000 mg | ORAL_TABLET | Freq: Two times a day (BID) | ORAL | Status: DC
  Administered 2021-11-26 – 2021-11-28 (×4): 5 mg via ORAL
  Filled 2021-11-26 (×4): qty 1

## 2021-11-26 MED ORDER — LACTATED RINGERS IV SOLN: INTRAVENOUS | Status: AC

## 2021-11-26 MED ORDER — SENNOSIDES-DOCUSATE SODIUM 8.6-50 MG PO TABS: 1.0000 | ORAL_TABLET | Freq: Every evening | ORAL | Status: DC | PRN

## 2021-11-26 MED ORDER — MELATONIN 5 MG PO TABS
2.5000 mg | ORAL_TABLET | Freq: Every evening | ORAL | Status: DC | PRN
  Administered 2021-11-26 – 2021-11-27 (×2): 2.5 mg via ORAL
  Filled 2021-11-26 (×2): qty 1

## 2021-11-26 MED ORDER — METRONIDAZOLE 500 MG/100ML IV SOLN
500.0000 mg | Freq: Once | INTRAVENOUS | Status: AC
  Administered 2021-11-26: 500 mg via INTRAVENOUS
  Filled 2021-11-26: qty 100

## 2021-11-26 NOTE — ED Triage Notes (Signed)
Pt in with co dizziness that started today, pt denies any pain. Pt has slurred speech and left sided weakness due to stroke years ago. Denies any new deficits.  ?

## 2021-11-26 NOTE — ED Notes (Signed)
Bear hugger remaining on pt due to temp of 94.7  ?

## 2021-11-26 NOTE — Consult Note (Signed)
CODE SEPSIS - PHARMACY COMMUNICATION ? ?**Broad Spectrum Antibiotics should be administered within 1 hour of Sepsis diagnosis** ? ?Time Code Sepsis Called/Page Received: 1045 ? ?Antibiotics Ordered: 1045 ? ?Time of 1st antibiotic administration: 1057 ? ?Additional action taken by pharmacy: N/A ? ?If necessary, Name of Provider/Nurse Contacted: N/A ? ? ? ?Derrek Gu ,PharmD ?Clinical Pharmacist  ?11/26/2021  10:51 AM ? ?

## 2021-11-26 NOTE — ED Notes (Signed)
Informed RN bed assigned 

## 2021-11-26 NOTE — ED Notes (Signed)
Lunch tray at bedside. ?

## 2021-11-26 NOTE — ED Provider Notes (Signed)
? ?[facility] ?Provider Note ? ? ? Event Date/Time  ? First MD Initiated Contact with Patient 11/26/21 [phone] [id number]   ?  (approximate) ? ? ?History  ? ?Dizziness ? ? ?HPI ? ?Evan AnoDuncan E Hilliker Sr. is a 66 y.o. male who presents to the ED for evaluation of Dizziness ?  ?I reviewed medical DC summary from 3/4.  He was admitted for symptomatic sinus bradycardia with Mobitz type I second-degree block.  Improving rates when discontinuing his beta-blocker. ?He has a history of CKD, multiple strokes with chronic left-sided weakness.  He has paroxysmal A-fib and is on Eliquis.  History of HTN, HLD. ?I reviewed cardiology clinic follow-up on 4/6. Improving to first-degree AV block.  He has been referred to EP.  14-day Holter monitor with rates ranging 27-126 with episodes of Mobitz type I second-degree block. ? ?Wife brings patient to the ED for evaluation of patient's dizziness and poor sleep last night.  She reports explicit concern that his heart rate was becoming low again because he seems so weak and is complaining of dizziness.  No falls or syncopal episodes. ?Denies melena, hematochezia, hematuria or other bleeding.  No chest pain, emesis or headache. ? ? ?Physical Exam  ? ?Triage Vital Signs: ?ED Triage Vitals  ?Enc Vitals Group  ?   BP 11/26/21 0923 137/83  ?   Pulse Rate 11/26/21 0923 (!) 53  ?   Resp 11/26/21 0923 16  ?   Temp --   ?   Temp src --   ?   SpO2 11/26/21 0923 98 %  ?   Weight 11/26/21 0919 150 lb (68 kg)  ?   Height 11/26/21 0919 5\' 6"  (1.676 m)  ?   Head Circumference --   ?   Peak Flow --   ?   Pain Score 11/26/21 0919 0  ?   Pain Loc --   ?   Pain Edu? --   ?   Excl. in GC? --   ? ? ?Most recent vital signs: ?Vitals:  ? 11/26/21 1030 11/26/21 1130  ?BP: 129/80 (!) 144/72  ?Pulse: (!) 42 (!) 47  ?Resp: 16 15  ?Temp:    ?SpO2: 100% 100%  ? ? ?General: Awake, no distress.  Dysarthric at baseline, per the wife. ?CV:  Good peripheral perfusion.  Bradycardic with a strong radial  pulse ?Resp:  Normal effort.  No wheezing, tachypnea or distress ?Abd:  No distention.  Soft and benign ?MSK:  No deformity noted.  ?Neuro:  Left-sided spastic weakness at baseline, per the wife.  No evidence of acute deficits superimposed upon this ?Other:   ? ? ?ED Results / Procedures / Treatments  ? ?Labs ?(all labs ordered are listed, but only abnormal results are displayed) ?Labs Reviewed  ?COMPREHENSIVE METABOLIC PANEL - Abnormal; Notable for the following components:  ?    Result Value  ? Potassium 3.4 (*)   ? Glucose, Bld 150 (*)   ? Creatinine, Ser 1.57 (*)   ? Albumin 3.3 (*)   ? AST 43 (*)   ? ALT 71 (*)   ? Alkaline Phosphatase 181 (*)   ? GFR, Estimated 49 (*)   ? All other components within normal limits  ?CBC WITH DIFFERENTIAL/PLATELET - Abnormal; Notable for the following components:  ? WBC 3.1 (*)   ? RBC 3.47 (*)   ? Hemoglobin 9.5 (*)   ? HCT 30.9 (*)   ? RDW 16.8 (*)   ? Neutro  Abs 1.6 (*)   ? All other components within normal limits  ?APTT - Abnormal; Notable for the following components:  ? aPTT 39 (*)   ? All other components within normal limits  ?CULTURE, BLOOD (ROUTINE X 2)  ?CULTURE, BLOOD (ROUTINE X 2)  ?URINE CULTURE  ?MAGNESIUM  ?LACTIC ACID, PLASMA  ?PROTIME-INR  ?PROCALCITONIN  ?LACTIC ACID, PLASMA  ?URINALYSIS, COMPLETE (UACMP) WITH MICROSCOPIC  ? ? ?EKG ?Sinus bradycardia with a rate of 53 bpm.  Normal axis.  First-degree AV block with PR interval of 218 ms.  Right bundle.  Tremulous baseline makes fine detail difficult.  Nonspecific ST changes to anterior and septal leads.  No STEMI. ?Similar to outpatient cardiology follow-up from 4/6. ? ?RADIOLOGY ?CXR reviewed by me without evidence of acute cardiopulmonary pathology. ?CT abdomen/pelvis reviewed by me without evidence of acute intra-abdominal pathology, but is partially visualized and LLL infiltrate concerning for pneumonia. ? ?Official radiology report(s): ?CT ABDOMEN PELVIS WO CONTRAST ? ?Result Date: 11/26/2021 ?CLINICAL  DATA:  Sepsis. EXAM: CT ABDOMEN AND PELVIS WITHOUT CONTRAST TECHNIQUE: Multidetector CT imaging of the abdomen and pelvis was performed following the standard protocol without IV contrast. RADIATION DOSE REDUCTION: This exam was performed according to the departmental dose-optimization program which includes automated exposure control, adjustment of the mA and/or kV according to patient size and/or use of iterative reconstruction technique. COMPARISON:  09/14/2021 CTA exam. FINDINGS: Lower chest: Patchy airspace disease noted left lower lobe suspicious for pneumonia but incompletely visualized. Hepatobiliary: No suspicious focal abnormality in the liver on this study without intravenous contrast. There is no evidence for gallstones, gallbladder wall thickening, or pericholecystic fluid. No intrahepatic or extrahepatic biliary dilation. Pancreas: No focal mass lesion. No dilatation of the main duct. No intraparenchymal cyst. No peripancreatic edema. Spleen: No splenomegaly. No focal mass lesion. Adrenals/Urinary Tract: No adrenal nodule or mass. Kidneys unremarkable. No evidence for hydroureter. Bladder is distended. Stomach/Bowel: Stomach is decompressed. Duodenum is normally positioned as is the ligament of Treitz. No small bowel wall thickening. No small bowel dilatation. The terminal ileum is normal. The appendix is normal. No gross colonic mass. No colonic wall thickening. Vascular/Lymphatic: There is mild atherosclerotic calcification of the abdominal aorta without aneurysm. There is no gastrohepatic or hepatoduodenal ligament lymphadenopathy. No retroperitoneal or mesenteric lymphadenopathy. No pelvic sidewall lymphadenopathy. Reproductive: The prostate gland is mildly enlarged. Seminal vesicles are unremarkable. Other: No intraperitoneal free fluid. Musculoskeletal: No worrisome lytic or sclerotic osseous abnormality. IMPRESSION: 1. Patchy airspace disease left lower lobe suspicious for pneumonia but  incompletely visualized. Patient did have some micro nodularity in this region previously but features appear progressive although, again, incompletely visualized. 2. No acute findings in the abdomen or pelvis. 3. Aortic Atherosclerosis (ICD10-I70.0). Electronically Signed   By: Kennith Center M.D.   On: 11/26/2021 11:21  ? ?DG Chest Port 1 View ? ?Result Date: 11/26/2021 ?CLINICAL DATA:  Slurred speech. EXAM: PORTABLE CHEST 1 VIEW COMPARISON:  10/16/2021 FINDINGS: The heart size and mediastinal contours are within normal limits. Both lungs are clear. The visualized skeletal structures are unremarkable. IMPRESSION: No active disease. Electronically Signed   By: Elige Ko M.D.   On: 11/26/2021 10:31   ? ?PROCEDURES and INTERVENTIONS: ? ?.1-3 Lead EKG Interpretation ?Performed by: Delton Prairie, MD ?Authorized by: Delton Prairie, MD  ? ?  Interpretation: abnormal   ?  ECG rate:  48 ?  ECG rate assessment: bradycardic   ?  Rhythm: A-V block   ?  Ectopy: none   ?  Conduction: normal   ?.Critical Care ?Performed by: Delton Prairie, MD ?Authorized by: Delton Prairie, MD  ? ?Critical care provider statement:  ?  Critical care time (minutes):  30 ?  Critical care time was exclusive of:  Separately billable procedures and treating other patients ?  Critical care was necessary to treat or prevent imminent or life-threatening deterioration of the following conditions:  Sepsis ?  Critical care was time spent personally by me on the following activities:  Development of treatment plan with patient or surrogate, discussions with consultants, evaluation of patient's response to treatment, examination of patient, ordering and review of laboratory studies, ordering and review of radiographic studies, ordering and performing treatments and interventions, pulse oximetry, re-evaluation of patient's condition and review of old charts ? ?Medications  ?lactated ringers infusion (has no administration in time range)  ?metroNIDAZOLE (FLAGYL) IVPB  500 mg (500 mg Intravenous New Bag/Given 11/26/21 1058)  ?vancomycin (VANCOCIN) 1,500 mg in sodium chloride 0.9 % 500 mL IVPB (has no administration in time range)  ?sodium chloride 0.9 % bolus 1,000 mL (1,000 mLs Intravenou

## 2021-11-26 NOTE — Sepsis Progress Note (Signed)
Sepsis protocol monitored by eLink 

## 2021-11-26 NOTE — ED Notes (Signed)
Patient placed on bair hugger at this time with warm blankets on top. Tamala Julian, MD aware. ?

## 2021-11-26 NOTE — Consult Note (Signed)
PHARMACY -  BRIEF ANTIBIOTIC NOTE  ? ?Pharmacy has received consult(s) for vancomycin and cefepime from an ED provider.  The patient's profile has been reviewed for ht/wt/allergies/indication/available labs.   ? ?One time order(s) placed for cefepime 2 g and vancomycin 1500 mg  ? ?Further antibiotics/pharmacy consults should be ordered by admitting physician if indicated.       ?                ?Thank you, ?Derrek Gu, PharmD ?11/26/2021  10:52 AM ? ?

## 2021-11-26 NOTE — H&P (Addendum)
?History and Physical  ? ? ?Evan BENYO Sr. LKG:[id number] DOB: [birth year] DOA: 11/26/2021 ? ?PCP: Charlynne Cousins, MD  ?Patient coming from: home ? ? ?Chief Complaint: dizziness and SOB ? ?HPI: Evan JACINTO Sr. is a 66 y.o. male with medical history significant of recurrent strokes, paroxysmal atrial fibrillation on a/c, type 2 diabetes mellitus, peripheral vascular disease, tobacco abuse presents with dizziness and sob. Patient while sitting today, felt dizzy and not feeling well. No syncope. Reports 2-3 days of sob but no chest pain. Wife reports pt had decrease po intake last couple of days. She reports has been fatigue and walking slow since his discharge from hospital last month. Denies diarrhea, numbness, HA, any other neurologic sx, abd pain. Denies fever or chills.  ? ?ED Course: In ED , temperature 89.9 was placed on Bair hugger's.  Heart rate 53 blood pressure 137/83 satting 98% on room air.  Pertinent labs potassium 3.4, glucose 150, creatinine 1.57 (last creatinine baseline 1.27) alk phos 181 albumin 3.3 AST 43 ALT 71, UA more than 500 glucose, hemoglobin urine dipstick small protein more than 300 rare bacteria nitrate and leukocytes negative.  Blood cultures drawn. ?Linus [facility]n covid test ordered pending.  Chest x-ray personally reviewed without any active disease. ? ? ?Off note: patient was recently discharged on 10/18/21 for bradycardia. Bradycardia resolved after discontinuation of metoprolol.Recent heart monitor showed predominately NSR with Mobitz type 1 and second degree 2:1 AV block, and he was to follow up with EP (his appointment was this week). T ? ? ?CT A/P: ?1. Patchy airspace disease left lower lobe suspicious for pneumonia ?but incompletely visualized. Patient did have some micro nodularity ?in this region previously but features appear progressive although, ?again, incompletely visualized. ?2. No acute findings in the abdomen or pelvis. ?3. Aortic Atherosclerosis (ICD10-I70.0). ? ?Patient was  found with LLL pna and hospitalist was called for admission. ? ? ? ?Review of Systems: All systems reviewed and otherwise negative.  ? ? ?Past Medical History:  ?Diagnosis Date  ? Carotid arterial disease ([place])   ? a. 08/2018 Carotid U/S: min-mod RICA atherosclerosis w/o hemodynamically significant stenosis. Nl LICA.  ? Diabetes 1.5, managed as type 2 ([place])   ? Diastolic dysfunction   ? a. 08/2018 Echo: EF 65%. No rwma. Gr1 DD. Mild MR.  ? Diastolic dysfunction   ? a. 08/2019 Echo: EF 55-60%, Gr1 DD. No rwma. Mild MR. RVSP 37.356mHg.  ? Hypercholesterolemia   ? Hypertension   ? PAF (paroxysmal atrial fibrillation) ([place]   ? a. 10/2019 Event Monitor: PAF; b. CHA2DS2VASc = 5-->Eliquis.  ? Poorly controlled diabetes mellitus ([place]   ? a. 04/2019 A1c 13.8.  ? Recurrent strokes ([place]   ? a. 10/2016 MRI/A: Acute 550mR thalamic infarct, ? subacute infarct of R corona radiata; b. 08/2017 MRI/A: Acute 56m74materal L thalamic infarct. Other more remote lacunar infarcts of thalami bilat. Small vessel dzs; c. 08/2018 MRI/A: Acute lacunar infarct of the post limb of R internal capsule; d. 08/2019 MRI Acute CVA of L paramedian pons adn R cerebellar hemisphere.  ? Tobacco abuse   ? ? ?Past Surgical History:  ?Procedure Laterality Date  ? ESOPHAGOGASTRODUODENOSCOPY (EGD) WITH PROPOFOL N/A 04/26/2019  ? Procedure: ESOPHAGOGASTRODUODENOSCOPY (EGD) WITH PROPOFOL;  Surgeon: VanLin LandsmanD;  [facility];  Service: Gastroenterology;  Laterality: N/A;  ? NO PAST SURGERIES    ? ? ? reports that he has quit smoking. His smoking use included cigarettes. He started smoking about 41 years  ago. He has a 38.00 pack-year smoking history. He has never used smokeless tobacco. He reports that he does not currently use alcohol after a past usage of about 1.0 standard drink per week. He reports that he does not currently use drugs after having used the following drugs: Cocaine and Marijuana. ? ?No Known Allergies ? ?Family History  ?Problem  Relation Age of Onset  ? Hypertension Mother   ? Stroke Mother   ?     died @ age 41  ? Hypertension Father   ? Diabetes Father   ? Heart attack Father   ?     died @ 81  ? ? ? ?Prior to Admission medications   ?Medication Sig Start Date End Date Taking? Authorizing Provider  ?albuterol (VENTOLIN HFA) 108 (90 Base) MCG/ACT inhaler Inhale 2 puffs into the lungs every 6 (six) hours as needed for wheezing or shortness of breath. 02/26/21  Yes Vigg, Avanti, MD  ?amLODipine (NORVASC) 10 MG tablet Take 1 tablet (10 mg total) by mouth daily. 06/27/21  Yes Vigg, Avanti, MD  ?apixaban (ELIQUIS) 5 MG TABS tablet Take 1 tablet (5 mg total) by mouth 2 (two) times daily. 10/18/21  Yes Wieting, Richard, MD  ?atorvastatin (LIPITOR) 80 MG tablet Take 1 tablet (80 mg total) by mouth daily. ?Patient taking differently: Take 80 mg by mouth every evening. 03/20/21  Yes Vigg, Avanti, MD  ?baclofen (LIORESAL) 10 MG tablet Take 1 tablet (10 mg total) by mouth 3 (three) times daily. 06/20/21  Yes Vigg, Avanti, MD  ?dapagliflozin propanediol (FARXIGA) 10 MG TABS tablet Take 1 tablet (10 mg total) by mouth daily before breakfast. In place of glipizide 10/18/21  Yes Wieting, Richard, MD  ?ketoconazole (NIZORAL) 2 % cream Apply 1 application topically daily. ?Patient taking differently: Apply 1 application. topically daily as needed for irritation. 07/12/21  Yes McDonald, Stephan Minister, DPM  ?omeprazole (PRILOSEC) 40 MG capsule TAKE 1 CAPSULE BY MOUTH IN THE MORNING AND AT BEDTIME ?Patient taking differently: Take 40 mg by mouth in the morning and at bedtime. 08/05/21  Yes Vigg, Avanti, MD  ?tamsulosin (FLOMAX) 0.4 MG CAPS capsule Take 1 capsule (0.4 mg total) by mouth daily. 10/18/21  Yes Loletha Grayer, MD  ?Danny Lawless GUIDE test strip Check fsbs once daily 12/24/20   Steele Sizer, MD  ?Accu-Chek Softclix Lancets lancets SMARTSIG:Topical 12/24/20   Steele Sizer, MD  ?Blood Glucose Monitoring Suppl KIT Accucheck Guide with lancets and test strips #100  with one refill.  Test blood sugar twice daily. DX Code E11.65 ; Z79.4 07/09/20   Towanda Malkin, MD  ?Continuous Blood Gluc Sensor (FREESTYLE LIBRE 2 SENSOR) MISC 1 each by Does not apply route every 14 (fourteen) days. 06/20/21   Charlynne Cousins, MD  ?Incontinence Supply Disposable (COMFORT PROTECT ADULT DIAPER/L) MISC 1 Units by Does not apply route daily. Use 3-4 a day as needed 06/20/21   Charlynne Cousins, MD  ?Insulin Pen Needle 32G X 6 MM MISC Daily 10/24/20   Steele Sizer, MD  ?melatonin 1 MG TABS tablet TAKE 1 TABLET BY MOUTH AT BEDTIME ?Patient taking differently: Take 1 mg by mouth at bedtime. 10/01/21   Vigg, Avanti, MD  ?metoCLOPramide (REGLAN) 5 MG tablet TAKE 1 TABLET BY MOUTH 3 TIMES DAILY BEFORE MEALS ?Patient not taking: Reported on 11/26/2021 11/22/21   Venita Lick, NP  ?Misc. Devices MISC One pair of Compression stockings ? ?Hemiplegia and hemiparesis following cerebral infarction affecting left non-dominant side (HCC)  - Primary ?Codes:  Z36.644 ?Lower extremity edema  ?Codes: R60.0 ?Type 2 diabetes mellitus with hyperglycemia, with long-term current use of insulin ([place])  ?Codes: E11.65, Z79.4 May 30, 2020   Towanda Malkin, MD  ?nitroGLYCERIN (NITROSTAT) 0.4 MG SL tablet Place 1 tablet (0.4 mg total) under the tongue every 5 (five) minutes as needed for chest pain. 09/16/21   Emeterio Reeve, DO  ? ? ?Physical Exam: ?Vitals:  ? 11/26/21 0959 11/26/21 1030 11/26/21 1130 11/26/21 1200  ?BP:  129/80 (!) 144/72 (!) 147/74  ?Pulse:  (!) 42 (!) 47 (!) 51  ?Resp:  _0 ?Temp: (!) 89.4 ?F (31.9 ?C)     ?TempSrc: Rectal     ?SpO2:  100% 100% 100%  ?Weight:      ?Height:      ? ? ?Constitutional: NAD, calm, comfortable ?Vitals:  ? 11/26/21 0959 11/26/21 1030 11/26/21 1130 11/26/21 1200  ?BP:  129/80 (!) 144/72 (!) 147/74  ?Pulse:  (!) 42 (!) 47 (!) 51  ?Resp:  _1 ?Temp: (!) 89.4 ?F (31.9 ?C)     ?TempSrc: Rectal     ?SpO2:  100% 100% 100%  ?Weight:      ?Height:      ? ?Eyes: PERRL,  lids and conjunctivae normal ?ENMT: Mucous membranes are moist. Posterior pharynx clear of any exudate or lesions. ?Neck: normal, supple, no masses, no thyromegaly ?Respiratory: minimal crackles at Lt ba

## 2021-11-26 NOTE — ED Notes (Signed)
Marylu Lund, MD at bedside assessing patient. ?

## 2021-11-27 ENCOUNTER — Encounter: Payer: Self-pay | Admitting: *Deleted

## 2021-11-27 ENCOUNTER — Ambulatory Visit: Payer: Medicaid Other | Admitting: Internal Medicine

## 2021-11-27 ENCOUNTER — Encounter: Payer: Self-pay | Admitting: Internal Medicine

## 2021-11-27 DIAGNOSIS — J189 Pneumonia, unspecified organism: Secondary | ICD-10-CM | POA: Diagnosis not present

## 2021-11-27 DIAGNOSIS — E1165 Type 2 diabetes mellitus with hyperglycemia: Secondary | ICD-10-CM | POA: Diagnosis not present

## 2021-11-27 DIAGNOSIS — Z006 Encounter for examination for normal comparison and control in clinical research program: Secondary | ICD-10-CM

## 2021-11-27 DIAGNOSIS — Z8673 Personal history of transient ischemic attack (TIA), and cerebral infarction without residual deficits: Secondary | ICD-10-CM | POA: Diagnosis not present

## 2021-11-27 DIAGNOSIS — N179 Acute kidney failure, unspecified: Secondary | ICD-10-CM | POA: Diagnosis not present

## 2021-11-27 DIAGNOSIS — R42 Dizziness and giddiness: Secondary | ICD-10-CM | POA: Diagnosis not present

## 2021-11-27 DIAGNOSIS — A419 Sepsis, unspecified organism: Secondary | ICD-10-CM | POA: Diagnosis not present

## 2021-11-27 DIAGNOSIS — I482 Chronic atrial fibrillation, unspecified: Secondary | ICD-10-CM | POA: Diagnosis not present

## 2021-11-27 DIAGNOSIS — Z794 Long term (current) use of insulin: Secondary | ICD-10-CM | POA: Diagnosis not present

## 2021-11-27 LAB — GLUCOSE, CAPILLARY
Glucose-Capillary: 107 mg/dL — ABNORMAL HIGH (ref 70–99)
Glucose-Capillary: 110 mg/dL — ABNORMAL HIGH (ref 70–99)
Glucose-Capillary: 130 mg/dL — ABNORMAL HIGH (ref 70–99)
Glucose-Capillary: 82 mg/dL (ref 70–99)
Glucose-Capillary: 84 mg/dL (ref 70–99)
Glucose-Capillary: 90 mg/dL (ref 70–99)

## 2021-11-27 LAB — CORTISOL-AM, BLOOD: Cortisol - AM: 7.2 ug/dL (ref 6.7–22.6)

## 2021-11-27 LAB — CBC
HCT: 28.1 % — ABNORMAL LOW (ref 39.0–52.0)
Hemoglobin: 8.9 g/dL — ABNORMAL LOW (ref 13.0–17.0)
MCH: 28.3 pg (ref 26.0–34.0)
MCHC: 31.7 g/dL (ref 30.0–36.0)
MCV: 89.2 fL (ref 80.0–100.0)
Platelets: 206 10*3/uL (ref 150–400)
RBC: 3.15 MIL/uL — ABNORMAL LOW (ref 4.22–5.81)
RDW: 16.8 % — ABNORMAL HIGH (ref 11.5–15.5)
WBC: 4.5 10*3/uL (ref 4.0–10.5)
nRBC: 0 % (ref 0.0–0.2)

## 2021-11-27 LAB — BASIC METABOLIC PANEL
Anion gap: 7 (ref 5–15)
BUN: 15 mg/dL (ref 8–23)
CO2: 25 mmol/L (ref 22–32)
Calcium: 8.5 mg/dL — ABNORMAL LOW (ref 8.9–10.3)
Chloride: 110 mmol/L (ref 98–111)
Creatinine, Ser: 1.27 mg/dL — ABNORMAL HIGH (ref 0.61–1.24)
GFR, Estimated: >60 mL/min (ref >60)
Glucose, Bld: 91 mg/dL (ref 70–99)
Potassium: 3.2 mmol/L — ABNORMAL LOW (ref 3.5–5.1)
Sodium: 142 mmol/L (ref 135–145)

## 2021-11-27 LAB — PROCALCITONIN: Procalcitonin: <0.1 ng/mL

## 2021-11-27 LAB — PROTIME-INR
INR: 1.2 (ref 0.8–1.2)
Prothrombin Time: 15.4 seconds — ABNORMAL HIGH (ref 11.4–15.2)

## 2021-11-27 MED ORDER — POTASSIUM CHLORIDE CRYS ER 20 MEQ PO TBCR
40.0000 meq | EXTENDED_RELEASE_TABLET | Freq: Once | ORAL | Status: AC
  Administered 2021-11-27: 40 meq via ORAL
  Filled 2021-11-27: qty 2

## 2021-11-27 MED ORDER — LACTATED RINGERS IV SOLN
INTRAVENOUS | Status: DC
Start: 2021-11-27 — End: 2021-11-28

## 2021-11-27 MED ORDER — POTASSIUM CHLORIDE CRYS ER 20 MEQ PO TBCR
20.0000 meq | EXTENDED_RELEASE_TABLET | Freq: Once | ORAL | Status: AC
  Administered 2021-11-27: 20 meq via ORAL
  Filled 2021-11-27: qty 1

## 2021-11-27 NOTE — Research (Signed)
Spoke wit Evan Townsend about Haematologist. He states he is not interested.  ?

## 2021-11-27 NOTE — Progress Notes (Addendum)
?PROGRESS NOTE ? ? ? ?Evan TERPSTRA Sr.  UTM:[id number] DOB: 12-05-1955 DOA: 11/26/2021 ?PCP: Loura Pardon, MD  ? ? ?Brief Narrative:  ?Evan WESTMAN Sr. is a 66 y.o. male with medical history significant of recurrent strokes, paroxysmal atrial fibrillation on a/c, type 2 diabetes mellitus, peripheral vascular disease, tobacco abuse presents with dizziness and sob. Patient while sitting today, felt dizzy and not feeling well. No syncope. Reports 2-3 days of sob but no chest pain. Wife reports pt had decrease po intake last couple of days. She reports has been fatigue and walking slow since his discharge from hospital last month. Denies diarrhea, numbness, HA, any other neurologic sx, abd pain. Denies fever or chills.  ? ?Found with pneumonia ? ?Consultants:  ? ? ?Procedures:  ? ?Antimicrobials:  ?Azithromycin Rocephin ? ? ?Subjective: ?Feels better.  Less short of breath.  No new complaints ? ?Objective: ?Vitals:  ? 11/26/21 2352 11/27/21 0441 11/27/21 0748 11/27/21 1143  ?BP: (!) 153/82 (!) 167/86 (!) 168/90 (!) 164/85  ?Pulse: (!) 59 78 66 62  ?Resp: 17 17 16 16   ?Temp: (!) 97.5 ?F (36.4 ?C) 98.1 ?F (36.7 ?C) 98.1 ?F (36.7 ?C) 98.4 ?F (36.9 ?C)  ?TempSrc:  Oral    ?SpO2: 99% 100% 99% 97%  ?Weight:      ?Height:      ? ? ?Intake/Output Summary (Last 24 hours) at 11/27/2021 1619 ?Last data filed at 11/27/2021 1014 ?Gross per 24 hour  ?Intake 2489.9 ml  ?Output 250 ml  ?Net 2239.9 ml  ? ?Filed Weights  ? 11/26/21 0919  ?Weight: 68 kg  ? ? ?Examination: ?Calm, NAD ?No wheezing, minimal scattered cracklesLt base ?Reg s1/s2 no gallop ?Soft benign +bs ?No edema ?Aaoxox3  ?Mood and affect appropriate in current setting  ? ? ? ?Data Reviewed: I have personally reviewed following labs and imaging studies ? ?CBC: ?Recent Labs  ?Lab 11/26/21 ?01/26/22 11/27/21 ?11/29/21  ?WBC 3.1* 4.5  ?NEUTROABS 1.6*  --   ?HGB 9.5* 8.9*  ?HCT 30.9* 28.1*  ?MCV 89.0 89.2  ?PLT 221 206  ? ?Basic Metabolic Panel: ?Recent Labs  ?Lab 11/26/21 ?01/26/22  11/27/21 ?11/29/21  ?NA 141 142  ?K 3.4* 3.2*  ?CL 108 110  ?CO2 26 25  ?GLUCOSE 150* 91  ?BUN 21 15  ?CREATININE 1.57* 1.27*  ?CALCIUM 9.2 8.5*  ?MG 2.0  --   ? ?GFR: ?Estimated Creatinine Clearance: 53 mL/min (A) (by C-G formula based on SCr of 1.27 mg/dL (H)). ?Liver Function Tests: ?Recent Labs  ?Lab 11/26/21 ?0940  ?AST 43*  ?ALT 71*  ?ALKPHOS 181*  ?BILITOT 0.5  ?PROT 7.5  ?ALBUMIN 3.3*  ? ?No results for input(s): LIPASE, AMYLASE in the last 168 hours. ?No results for input(s): AMMONIA in the last 168 hours. ?Coagulation Profile: ?Recent Labs  ?Lab 11/26/21 ?1004 11/27/21 ?0630  ?INR 1.2 1.2  ? ?Cardiac Enzymes: ?No results for input(s): CKTOTAL, CKMB, CKMBINDEX, TROPONINI in the last 168 hours. ?BNP (last 3 results) ?No results for input(s): PROBNP in the last 8760 hours. ?HbA1C: ?No results for input(s): HGBA1C in the last 72 hours. ?CBG: ?Recent Labs  ?Lab 11/26/21 ?1941 11/27/21 ?0001 11/27/21 ?11/29/21 11/27/21 ?11/29/21 11/27/21 ?1142  ?GLUCAP 84 110* 84 82 90  ? ?Lipid Profile: ?No results for input(s): CHOL, HDL, LDLCALC, TRIG, CHOLHDL, LDLDIRECT in the last 72 hours. ?Thyroid Function Tests: ?No results for input(s): TSH, T4TOTAL, FREET4, T3FREE, THYROIDAB in the last 72 hours. ?Anemia Panel: ?No results for input(s): VITAMINB12, FOLATE,  FERRITIN, TIBC, IRON, RETICCTPCT in the last 72 hours. ?Sepsis Labs: ?Recent Labs  ?Lab 11/26/21 ?1004 11/27/21 ?0630  ?PROCALCITON <0.10 <0.10  ?LATICACIDVEN 1.1  --   ? ? ?Recent Results (from the past 240 hour(s))  ?Urine Culture     Status: None (Preliminary result)  ? Collection Time: 11/26/21 12:37 PM  ? Specimen: In/Out Cath Urine  ?Result Value Ref Range Status  ? Specimen Description   Final  ?  IN/OUT CATH URINE ?Performed at [facility], [address] [place], [place] [address] ?  ? Special Requests   Final  ?  NONE ?Performed at [facility], [address] [place], [place] [address] ?  ? Culture   Final  ?  CULTURE REINCUBATED FOR BETTER  GROWTH ?Performed at [facility], [address], [place], [place] [address] ?  ? Report Status PENDING  Incomplete  ?Resp Panel by RT-PCR (Flu A&B, Covid) Nasopharyngeal Swab     Status: None  ? Collection Time: 11/26/21  1:05 PM  ? Specimen: Nasopharyngeal Swab; Nasopharyngeal(NP) swabs in vial transport medium  ?Result Value Ref Range Status  ? SARS Coronavirus 2 by RT PCR NEGATIVE NEGATIVE Final  ?  Comment: (NOTE) ?SARS-CoV-2 target nucleic acids are NOT DETECTED. ? ?The SARS-CoV-2 RNA is generally detectable in upper respiratory ?specimens during the acute phase of infection. The lowest ?concentration of SARS-CoV-2 viral copies this assay can detect is ?138 copies/mL. A negative result does not preclude SARS-Cov-2 ?infection and should not be used as the sole basis for treatment or ?other patient management decisions. A negative result may occur with  ?improper specimen collection/handling, submission of specimen other ?than nasopharyngeal swab, presence of viral mutation(s) within the ?areas targeted by this assay, and inadequate number of viral ?copies(<138 copies/mL). A negative result must be combined with ?clinical observations, patient history, and epidemiological ?information. The expected result is Negative. ? ?Fact Sheet for Patients:  ?BloggerCourse.com ? ?Fact Sheet for Healthcare Providers:  ?SeriousBroker.it ? ?This test is no t yet approved or cleared by the [place] FDA and  ?has been authorized for detection and/or diagnosis of SARS-CoV-2 by ?FDA under an Emergency Use Authorization (EUA). This EUA will remain  ?in effect (meaning this test can be used) for the duration of the ?COVID-19 declaration under Section 564(b)(1) of the Act, 21 ?U.S.C.section 360bbb-3(b)(1), unless the authorization is terminated  ?or revoked sooner.  ? ? ?  ? Influenza A by PCR NEGATIVE NEGATIVE Final  ? Influenza B by PCR NEGATIVE NEGATIVE Final  ?   Comment: (NOTE) ?The Xpert Xpress SARS-CoV-2/FLU/RSV plus assay is intended as an aid ?in the diagnosis of influenza from Nasopharyngeal swab specimens and ?should not be used as a sole basis for treatment. Nasal washings and ?aspirates are unacceptable for Xpert Xpress SARS-CoV-2/FLU/RSV ?testing. ? ?Fact Sheet for Patients: ?BloggerCourse.com ? ?Fact Sheet for Healthcare Providers: ?SeriousBroker.it ? ?This test is not yet approved or cleared by the [place] FDA and ?has been authorized for detection and/or diagnosis of SARS-CoV-2 by ?FDA under an Emergency Use Authorization (EUA). This EUA will remain ?in effect (meaning this test can be used) for the duration of the ?COVID-19 declaration under Section 564(b)(1) of the Act, 21 U.S.C. ?section 360bbb-3(b)(1), unless the authorization is terminated or ?revoked. ? ?Performed at [facility], [address] [place], ?[place] [address] ?  ?  ? ? ? ? ? ?Radiology Studies: ?CT ABDOMEN PELVIS WO CONTRAST ? ?Result Date: 11/26/2021 ?CLINICAL  DATA:  Sepsis. EXAM: CT ABDOMEN AND PELVIS WITHOUT CONTRAST TECHNIQUE: Multidetector CT imaging of the abdomen and pelvis was performed following the standard protocol without IV contrast. RADIATION DOSE REDUCTION: This exam was performed according to the departmental dose-optimization program which includes automated exposure control, adjustment of the mA and/or kV according to patient size and/or use of iterative reconstruction technique. COMPARISON:  09/14/2021 CTA exam. FINDINGS: Lower chest: Patchy airspace disease noted left lower lobe suspicious for pneumonia but incompletely visualized. Hepatobiliary: No suspicious focal abnormality in the liver on this study without intravenous contrast. There is no evidence for gallstones, gallbladder wall thickening, or pericholecystic fluid. No intrahepatic or extrahepatic biliary dilation. Pancreas: No focal mass lesion. No  dilatation of the main duct. No intraparenchymal cyst. No peripancreatic edema. Spleen: No splenomegaly. No focal mass lesion. Adrenals/Urinary Tract: No adrenal nodule or mass. Kidneys unremarkable. No evidence for

## 2021-11-27 NOTE — Evaluation (Signed)
Physical Therapy Evaluation ?Patient Details ?Name: Evan DIELEMAN Sr. ?MRN: [id number] ?DOB: [birth year] ?Today's Date: 11/27/2021 ? ?History of Present Illness ? Evan JARRET Sr. is a 66 y.o. male with medical history significant of recurrent strokes, paroxysmal atrial fibrillation on a/c, type 2 diabetes mellitus, peripheral vascular disease, tobacco abuse presents with dizziness and sob. Patient while sitting today, felt dizzy and not feeling well. No syncope. Reports 2-3 days of sob but no chest pain. Wife reports pt had decrease po intake last couple of days. She reports has been fatigue and walking slow since his discharge from hospital last month. Denies diarrhea, numbness, HA, any other neurologic sx, abd pain. Denies fever or chills.  ?Clinical Impression ? Patient received in bed, agreeable to PT assessment. Patient doing well with mobility, likely near baseline. Ambulated 80 feet and up/down 3 steps with spc. Min guard. No lob. He will continue to benefit from skilled PT while here to maintain function and return home safely.      ?   ? ?Recommendations for follow up therapy are one component of a multi-disciplinary discharge planning process, led by the attending physician.  Recommendations may be updated based on patient status, additional functional criteria and insurance authorization. ? ?Follow Up Recommendations No PT follow up ? ?  ?Assistance Recommended at Discharge PRN  ?Patient can return home with the following ? A little help with walking and/or transfers;A little help with bathing/dressing/bathroom;Help with stairs or ramp for entrance;Assistance with cooking/housework;Assist for transportation ? ?  ?Equipment Recommendations None recommended by PT  ?Recommendations for Other Services ?    ?  ?Functional Status Assessment Patient has had a recent decline in their functional status and demonstrates the ability to make significant improvements in function in a reasonable and predictable  amount of time.  ? ?  ?Precautions / Restrictions Precautions ?Precautions: Fall ?Precaution Comments: mod fall ?Restrictions ?Weight Bearing Restrictions: No  ? ?  ? ?Mobility ? Bed Mobility ?Overal bed mobility: Modified Independent ?  ?  ?  ?  ?  ?  ?  ?  ? ?Transfers ?Overall transfer level: Modified independent ?  ?  ?  ?  ?  ?  ?  ?  ?  ?  ? ?Ambulation/Gait ?Ambulation/Gait assistance: Modified independent (Device/Increase time) ?Gait Distance (Feet): 80 Feet ?Assistive device: Straight cane ?Gait Pattern/deviations: Step-to pattern, Decreased step length - right, Decreased step length - left, Decreased dorsiflexion - left ?Gait velocity: decreased ?  ?  ?General Gait Details: ambulates well with SPC, min guard, good safety awareness ? ?Stairs ?Stairs: Yes ?Stairs assistance: Min guard ?Stair Management: One rail Right, One rail Left, Step to pattern ?Number of Stairs: 3 ?General stair comments: safe on steps ? ?Wheelchair Mobility ?  ? ?Modified Rankin (Stroke Patients Only) ?  ? ?  ? ?Balance Overall balance assessment: Needs assistance ?Sitting-balance support: Feet supported ?Sitting balance-Leahy Scale: Normal ?  ?  ?Standing balance support: Single extremity supported, During functional activity, Reliant on assistive device for balance ?Standing balance-Leahy Scale: Good ?Standing balance comment: min guard for safety ?  ?  ?  ?  ?  ?  ?  ?  ?  ?  ?  ?   ? ? ? ?Pertinent Vitals/Pain Pain Assessment ?Pain Assessment: No/denies pain  ? ? ?Home Living Family/patient expects to be discharged to:: Private residence ?Living Arrangements: Spouse/significant other ?Available Help at Discharge: Family;Available 24 hours/day ?Type of Home: House ?Home Access: Stairs to enter ?Entrance  Stairs-Rails: Right;Left ?Entrance Stairs-Number of Steps: 3 ?  ?Home Layout: One level ?Home Equipment: Other (comment);Grab bars - tub/shower (lofstrand crutch) ?   ?  ?Prior Function Prior Level of Function : Independent/Modified  Independent ?  ?  ?  ?  ?  ?  ?Mobility Comments: independent with crutch ?ADLs Comments: independent at baseline ?  ? ? ?Hand Dominance  ? Dominant Hand: Right ? ?  ?Extremity/Trunk Assessment  ? Upper Extremity Assessment ?Upper Extremity Assessment: LUE deficits/detail ?LUE Deficits / Details: contracted/weak from prior CVA ?LUE Coordination: decreased gross motor ?  ? ?Lower Extremity Assessment ?Lower Extremity Assessment: LLE deficits/detail ?LLE Deficits / Details: weakness from prior CVA ?LLE Coordination: decreased gross motor ?  ? ?Cervical / Trunk Assessment ?Cervical / Trunk Assessment: Normal  ?Communication  ? Communication: Expressive difficulties;Other (comment) (slightly garbled at times)  ?Cognition Arousal/Alertness: Awake/alert ?Behavior During Therapy: [facility] for tasks assessed/performed ?Overall Cognitive Status: Within Functional Limits for tasks assessed ?  ?  ?  ?  ?  ?  ?  ?  ?  ?  ?  ?  ?  ?  ?  ?  ?  ?  ?  ? ?  ?General Comments   ? ?  ?Exercises    ? ?Assessment/Plan  ?  ?PT Assessment Patient needs continued PT services  ?PT Problem List Decreased strength;Decreased mobility;Decreased activity tolerance;Decreased balance;Decreased range of motion ? ?   ?  ?PT Treatment Interventions Gait training;Functional mobility training;Therapeutic activities;Patient/family education   ? ?PT Goals (Current goals can be found in the Care Plan section)  ?Acute Rehab PT Goals ?Patient Stated Goal: to go home ?PT Goal Formulation: With patient ?Time For Goal Achievement: 12/04/21 ?Potential to Achieve Goals: Good ? ?  ?Frequency Min 2X/week ?  ? ? ?Co-evaluation   ?  ?  ?  ?  ? ? ?  ?AM-PAC PT "6 Clicks" Mobility  ?Outcome Measure Help needed turning from your back to your side while in a flat bed without using bedrails?: None ?Help needed moving from lying on your back to sitting on the side of a flat bed without using bedrails?: None ?Help needed moving to and from a bed to a chair (including a  wheelchair)?: A Little ?Help needed standing up from a chair using your arms (e.g., wheelchair or bedside chair)?: A Little ?Help needed to walk in hospital room?: A Little ?Help needed climbing 3-5 steps with a railing? : A Little ?6 Click Score: 20 ? ?  ?End of Session Equipment Utilized During Treatment: Gait belt ?Activity Tolerance: Patient tolerated treatment well ?Patient left: in chair;with call bell/phone within reach;with chair alarm set ?Nurse Communication: Mobility status ?PT Visit Diagnosis: Muscle weakness (generalized) (M62.81);Difficulty in walking, not elsewhere classified (R26.2);Unsteadiness on feet (R26.81) ?  ? ?Time: [id number] ?PT Time Calculation (min) (ACUTE ONLY): 24 min ? ? ?Charges:   PT Evaluation ?$PT Eval Moderate Complexity: 1 Mod ?PT Treatments ?$Gait Training: 8-22 mins ?  ?   ? ? ?Amanda Cockayne, PT, GCS ?11/27/21,2:29 PM ? ?

## 2021-11-28 DIAGNOSIS — R42 Dizziness and giddiness: Secondary | ICD-10-CM | POA: Diagnosis not present

## 2021-11-28 DIAGNOSIS — A419 Sepsis, unspecified organism: Secondary | ICD-10-CM | POA: Diagnosis not present

## 2021-11-28 DIAGNOSIS — N179 Acute kidney failure, unspecified: Secondary | ICD-10-CM | POA: Diagnosis not present

## 2021-11-28 DIAGNOSIS — E1165 Type 2 diabetes mellitus with hyperglycemia: Secondary | ICD-10-CM | POA: Diagnosis not present

## 2021-11-28 DIAGNOSIS — Z794 Long term (current) use of insulin: Secondary | ICD-10-CM | POA: Diagnosis not present

## 2021-11-28 DIAGNOSIS — Z8673 Personal history of transient ischemic attack (TIA), and cerebral infarction without residual deficits: Secondary | ICD-10-CM | POA: Diagnosis not present

## 2021-11-28 DIAGNOSIS — J189 Pneumonia, unspecified organism: Secondary | ICD-10-CM | POA: Diagnosis not present

## 2021-11-28 DIAGNOSIS — I482 Chronic atrial fibrillation, unspecified: Secondary | ICD-10-CM | POA: Diagnosis not present

## 2021-11-28 LAB — GLUCOSE, CAPILLARY
Glucose-Capillary: 109 mg/dL — ABNORMAL HIGH (ref 70–99)
Glucose-Capillary: 83 mg/dL (ref 70–99)
Glucose-Capillary: 90 mg/dL (ref 70–99)

## 2021-11-28 LAB — HEPATIC FUNCTION PANEL
ALT: 53 U/L — ABNORMAL HIGH (ref 0–44)
AST: 37 U/L (ref 15–41)
Albumin: 3.2 g/dL — ABNORMAL LOW (ref 3.5–5.0)
Alkaline Phosphatase: 157 U/L — ABNORMAL HIGH (ref 38–126)
Bilirubin, Direct: <0.1 mg/dL (ref 0.0–0.2)
Total Bilirubin: 0.8 mg/dL (ref 0.3–1.2)
Total Protein: 6.8 g/dL (ref 6.5–8.1)

## 2021-11-28 LAB — PROCALCITONIN: Procalcitonin: <0.1 ng/mL

## 2021-11-28 LAB — POTASSIUM: Potassium: 3.8 mmol/L (ref 3.5–5.1)

## 2021-11-28 MED ORDER — AMLODIPINE BESYLATE 10 MG PO TABS
10.0000 mg | ORAL_TABLET | Freq: Every day | ORAL | Status: DC
  Administered 2021-11-28: 10 mg via ORAL
  Filled 2021-11-28: qty 1

## 2021-11-28 MED ORDER — HYDRALAZINE HCL 20 MG/ML IJ SOLN
10.0000 mg | Freq: Once | INTRAMUSCULAR | Status: AC
  Administered 2021-11-28: 10 mg via INTRAVENOUS
  Filled 2021-11-28: qty 1

## 2021-11-28 MED ORDER — AMOXICILLIN-POT CLAVULANATE 875-125 MG PO TABS: 1.0000 | ORAL_TABLET | Freq: Two times a day (BID) | ORAL | 0 refills | Status: AC

## 2021-11-28 MED ORDER — LABETALOL HCL 5 MG/ML IV SOLN
10.0000 mg | Freq: Once | INTRAVENOUS | Status: DC
  Filled 2021-11-28: qty 4

## 2021-11-28 NOTE — Progress Notes (Signed)
?  Transition of Care (TOC) Screening Note ? ? ?Patient Details  ?Name: Evan DEDMAN Sr. ?Date of Birth: 1955/09/18 ? ? ?Transition of Care (TOC) CM/SW Contact:    ?Gildardo Griffes, LCSW ?Phone Number: ?11/28/2021, 8:35 AM ? ?CSW notes PT signed off with no follow up needs. ? ?Transition of Care Department [facility]) has reviewed patient and no TOC needs have been identified at this time. We will continue to monitor patient advancement through interdisciplinary progression rounds. If new patient transition needs arise, please place a TOC consult. ? Angeline Slim, [place] ?[phone] ? ?

## 2021-11-28 NOTE — Plan of Care (Signed)

## 2021-11-28 NOTE — Progress Notes (Signed)
Physical Therapy Treatment ?Patient Details ?Name: Evan DASHNER Sr. ?MRN: [id number] ?DOB: 04-29-1956 ?Today's Date: 11/28/2021 ? ? ?History of Present Illness Evan SPIRA Sr. is a 66 y.o. male with medical history significant of recurrent strokes, paroxysmal atrial fibrillation on a/c, type 2 diabetes mellitus, peripheral vascular disease, tobacco abuse presents with dizziness and sob. Patient while sitting today, felt dizzy and not feeling well. No syncope. Reports 2-3 days of sob but no chest pain. Wife reports pt had decrease po intake last couple of days. She reports has been fatigue and walking slow since his discharge from hospital last month. Denies diarrhea, numbness, HA, any other neurologic sx, abd pain. Denies fever or chills. ? ?  ?PT Comments  ? ? Pt was long sitting in bed upon arriving. He agrees to session and endorses feeling ready to go home. Pt seems to be at baseline from a mobility standpoint. His previous CVA has left him with chronic deficits. Author recommends close assistance for when pt is up and moving. Issued gait belt for home use. Acute PT will continue to follow per current POC however no post acute PT needed due to pt being at baseline. ?  ?Recommendations for follow up therapy are one component of a multi-disciplinary discharge planning process, led by the attending physician.  Recommendations may be updated based on patient status, additional functional criteria and insurance authorization. ? ?Follow Up Recommendations ? No PT follow up (per chart review, pt is at baseline mobility) ?  ?  ?Assistance Recommended at Discharge PRN  ?Patient can return home with the following A little help with walking and/or transfers;A little help with bathing/dressing/bathroom;Help with stairs or ramp for entrance;Assistance with cooking/housework;Assist for transportation ?  ?Equipment Recommendations ? None recommended by PT  ?  ?   ?Precautions / Restrictions Precautions ?Precautions:  Fall ?Precaution Comments: mod fall ?Restrictions ?Weight Bearing Restrictions: No  ?  ? ?Mobility ? Bed Mobility ?Overal bed mobility: Modified Independent ?  ?Transfers ?Overall transfer level: Needs assistance ?Equipment used: Straight cane ?Transfers: Sit to/from Stand ?Sit to Stand: Min guard ?  ?   ?General transfer comment: CGA for safety ?  ? ?Ambulation/Gait ?Ambulation/Gait assistance: Min guard ?Gait Distance (Feet): 100 Feet ?Assistive device: Straight cane ?Gait Pattern/deviations: Step-to pattern, Decreased step length - right, Decreased step length - left, Decreased dorsiflexion - left ?Gait velocity: decreased ?  ?  ?General Gait Details: pt ambulated ~ 100 ft with SPC + CGA. His deficits from previous CVAs are chronic ? ? ? ?  ?Balance Overall balance assessment: Needs assistance ?Sitting-balance support: Feet supported ?Sitting balance-Leahy Scale: Fair ?  ?  ?Standing balance support: Single extremity supported, During functional activity, Reliant on assistive device for balance ?Standing balance-Leahy Scale: Fair ?Standing balance comment: CGA throughout all standing activity due to balance concerns with dynamic standing activity ?  ?  ?  ?Cognition Arousal/Alertness: Awake/alert ?Behavior During Therapy: [facility] for tasks assessed/performed ?Overall Cognitive Status: Within Functional Limits for tasks assessed ?  ?   ?General Comments: pt is A and O x 3. does have some deficits from previous CVA that impacts cognition/speech ?  ?  ? ?  ? ?Pertinent Vitals/Pain Pain Assessment ?Pain Assessment: No/denies pain  ? ? ? ?PT Goals (current goals can now be found in the care plan section) Acute Rehab PT Goals ?Patient Stated Goal: to go home ?Progress towards PT goals: Progressing toward goals ? ?  ?Frequency ? ? ? Min 2X/week ? ? ? ?  ?  PT Plan Current plan remains appropriate  ? ? ?   ?AM-PAC PT "6 Clicks" Mobility   ?Outcome Measure ? Help needed turning from your back to your side while in a flat bed  without using bedrails?: A Little ?Help needed moving from lying on your back to sitting on the side of a flat bed without using bedrails?: A Little ?Help needed moving to and from a bed to a chair (including a wheelchair)?: A Little ?Help needed standing up from a chair using your arms (e.g., wheelchair or bedside chair)?: A Little ?Help needed to walk in hospital room?: A Little ?Help needed climbing 3-5 steps with a railing? : A Little ?6 Click Score: 18 ? ?  ?End of Session   ?Activity Tolerance: Patient tolerated treatment well ?Patient left: in bed;with call bell/phone within reach;with bed alarm set ?Nurse Communication: Mobility status ?PT Visit Diagnosis: Muscle weakness (generalized) (M62.81);Difficulty in walking, not elsewhere classified (R26.2);Unsteadiness on feet (R26.81) ?  ? ? ?Time: 3614-4315 ?PT Time Calculation (min) (ACUTE ONLY): 25 min ? ?Charges:  $Gait Training: 8-22 mins ?$Therapeutic Activity: 8-22 mins          ?          ? ?Jetta Lout PTA ?11/28/21, 9:00 AM  ? ?

## 2021-11-28 NOTE — Progress Notes (Signed)
Cross Cover ?Dose of IV hydralazine for systolic blood pressure 190 ?

## 2021-11-28 NOTE — Progress Notes (Signed)
CSW spoke with patient's wife reports patient goes to outpatient PT in [place] with [facility]  ? ?CSW called them at [phone], they report no needs from [facility] they will follow up with patient's PCP for any new orders they may need. Confirmed patient has appointments set up with them for next week.  ? Angeline S[place], [place] ?[phone] ? ?

## 2021-11-28 NOTE — Discharge Summary (Signed)
Evan LAMARQUE Sr. VOZ:[id number] DOB: 09-08-1955 DOA: 11/26/2021 ? ?PCP: Charlynne Cousins, MD ? ?Admit date: 11/26/2021 ?Discharge date: 11/28/2021 ? ?Admitted From: home ?Disposition:  home ? ?Recommendations for Outpatient Follow-up:  ?Follow up with PCP in 1 week ?Please obtain BMP/CBC in one week ?Follow up blood and urine cx ? ? ? ? ? ?Discharge Condition:Stable ?CODE STATUS:full  ?Diet recommendation: Heart Healthy  ? ?Brief/Interim Summary: ?Per HPI: Evan MAGNER Sr. is a 66 y.o. male with medical history significant of recurrent strokes, paroxysmal atrial fibrillation on a/c, type 2 diabetes mellitus, peripheral vascular disease, tobacco abuse presents with dizziness and sob. Patient while sitting today, felt dizzy and not feeling well. No syncope. Reports 2-3 days of sob but no chest pain. Wife reports pt had decrease po intake last couple of days. She reports has been fatigue and walking slow since his discharge from hospital last month. Denies diarrhea, numbness, HA, any other neurologic sx, abd pain. Denies fever or chills.  ? ? ?Sepsis  ?Patient found with leukopenia and hypothermia per criteria.   ?Likely secondary to LLL PNA ?Hemodynamically stable ?Continue antibiotic ?F/u cx ?  ?  ?2. LLL PNA ?Found on CT scan. ?Pt with few days of sob ?Hypothermic/leukopenic ?Clinically improved. On RA. ?Discharge with Augmentin to complete course ?  ?3. DM2 with hyperlipidemia ?A1 C 6.2 on 11/12/21 ?Continue statin ? ?  ?4. AKI ?Likely prerenal from decrease po intake/pna. ?Avoid nephrotoxic meds ?Improved with ivf ?Encouraged hydration at home ? ?  ?  ?5. Hx/o afib  ?H/xo cva ?Continue home a/c ?  ?6. Dizziness ?Possibly 2/2 bradycardia v.s. dehydration/AKI or multifactorial ?Suppose to f/u with EP as outpatient. ?IVF for hydration ?PT/OT -no PT f/u needed ? ?  ?7. Hypokalemia ?Replaced ?  ?8. Elevated LFT ?Etiology unclear ?On statin, but not too elevated to hold, especially with hx/o cva ?Improved, f/u with  pcp ? ? ? ? ?Discharge Diagnoses:  ?Principal Problem: ?  Pneumonia ?Active Problems: ?  Uncontrolled type 2 diabetes mellitus with hyperglycemia, with long-term current use of insulin ([place]) ?  Atrial fibrillation, chronic ([place]) ?  History of stroke ?  Sepsis due to pneumonia [facility]) ?  CAP (community acquired pneumonia) ?  Dizziness ?  AKI (acute kidney injury) ([place]) ? ? ? ?Discharge Instructions ? ?Discharge Instructions   ? ? Call MD for:  temperature >100.4   Complete by: As directed ?  ? Diet - low sodium heart healthy   Complete by: As directed ?  ? Discharge instructions   Complete by: As directed ?  ? F/u with pcp in one week ?Make sure sitting up when eating and drinking  ? Increase activity slowly   Complete by: As directed ?  ? ?  ? ?Allergies as of 11/28/2021   ?No Known Allergies ?  ? ?  ?Medication List  ?  ? ?TAKE these medications   ? ?Accu-Chek Guide test strip ?Generic drug: glucose blood ?Check fsbs once daily ?  ?Accu-Chek Softclix Lancets lancets ?SMARTSIG:Topical ?  ?albuterol 108 (90 Base) MCG/ACT inhaler ?Commonly known as: VENTOLIN HFA ?Inhale 2 puffs into the lungs every 6 (six) hours as needed for wheezing or shortness of breath. ?  ?amLODipine 10 MG tablet ?Commonly known as: NORVASC ?Take 1 tablet (10 mg total) by mouth daily. ?  ?amoxicillin-clavulanate 875-125 MG tablet ?Commonly known as: Augmentin ?Take 1 tablet by mouth every 12 (twelve) hours for 5 days. ?  ?apixaban 5 MG Tabs tablet ?Commonly  known as: Eliquis ?Take 1 tablet (5 mg total) by mouth 2 (two) times daily. ?  ?atorvastatin 80 MG tablet ?Commonly known as: LIPITOR ?Take 1 tablet (80 mg total) by mouth daily. ?What changed: when to take this ?  ?baclofen 10 MG tablet ?Commonly known as: LIORESAL ?Take 1 tablet (10 mg total) by mouth 3 (three) times daily. ?  ?Blood Glucose Monitoring Suppl Kit ?Accucheck Guide with lancets and test strips #100 with one refill.  Test blood sugar twice daily. DX Code E11.65 ; Z79.4 ?  ?Comfort  Protect Adult Diaper/L Misc ?1 Units by Does not apply route daily. Use 3-4 a day as needed ?  ?dapagliflozin propanediol 10 MG Tabs tablet ?Commonly known as: [place] ?Take 1 tablet (10 mg total) by mouth daily before breakfast. In place of glipizide ?  ?FreeStyle Libre 2 Sensor Misc ?1 each by Does not apply route every 14 (fourteen) days. ?  ?Insulin Pen Needle 32G X 6 MM Misc ?Daily ?  ?ketoconazole 2 % cream ?Commonly known as: NIZORAL ?Apply 1 application topically daily. ?What changed:  ?when to take this ?reasons to take this ?  ?melatonin 1 MG Tabs tablet ?TAKE 1 TABLET BY MOUTH AT BEDTIME ?  ?metoCLOPramide 5 MG tablet ?Commonly known as: REGLAN ?TAKE 1 TABLET BY MOUTH 3 TIMES DAILY BEFORE MEALS ?  ?Misc. Devices Misc ?One pair of Compression stockings ? ?Hemiplegia and hemiparesis following cerebral infarction affecting left non-dominant side (HCC)  - Primary ?Codes: G81.157 ?Lower extremity edema  ?Codes: R60.0 ?Type 2 diabetes mellitus with hyperglycemia, with long-term current use of insulin ([place])  ?Codes: E11.65, Z79.4 ?  ?nitroGLYCERIN 0.4 MG SL tablet ?Commonly known as: NITROSTAT ?Place 1 tablet (0.4 mg total) under the tongue every 5 (five) minutes as needed for chest pain. ?  ?omeprazole 40 MG capsule ?Commonly known as: PRILOSEC ?TAKE 1 CAPSULE BY MOUTH IN THE MORNING AND AT BEDTIME ?What changed: See the new instructions. ?  ?tamsulosin 0.4 MG Caps capsule ?Commonly known as: FLOMAX ?Take 1 capsule (0.4 mg total) by mouth daily. ?  ? ?  ? ? Follow-up Information   ? ? Vigg, Avanti, MD Follow up in 1 week(s).   ?Specialty: Internal Medicine ?Contact information: ?[address] ?[place] [address] ?[phone] ? ? ?  ?  ? ?  ?  ? ?  ? ?No Known Allergies ? ?Consultations: ? ? ? ?Procedures/Studies: ?CT ABDOMEN PELVIS WO CONTRAST ? ?Result Date: 11/26/2021 ?CLINICAL DATA:  Sepsis. EXAM: CT ABDOMEN AND PELVIS WITHOUT CONTRAST TECHNIQUE: Multidetector CT imaging of the abdomen and pelvis was performed  following the standard protocol without IV contrast. RADIATION DOSE REDUCTION: This exam was performed according to the departmental dose-optimization program which includes automated exposure control, adjustment of the mA and/or kV according to patient size and/or use of iterative reconstruction technique. COMPARISON:  09/14/2021 CTA exam. FINDINGS: Lower chest: Patchy airspace disease noted left lower lobe suspicious for pneumonia but incompletely visualized. Hepatobiliary: No suspicious focal abnormality in the liver on this study without intravenous contrast. There is no evidence for gallstones, gallbladder wall thickening, or pericholecystic fluid. No intrahepatic or extrahepatic biliary dilation. Pancreas: No focal mass lesion. No dilatation of the main duct. No intraparenchymal cyst. No peripancreatic edema. Spleen: No splenomegaly. No focal mass lesion. Adrenals/Urinary Tract: No adrenal nodule or mass. Kidneys unremarkable. No evidence for hydroureter. Bladder is distended. Stomach/Bowel: Stomach is decompressed. Duodenum is normally positioned as is the ligament of Treitz. No small bowel wall thickening.  No small bowel dilatation. The terminal ileum is normal. The appendix is normal. No gross colonic mass. No colonic wall thickening. Vascular/Lymphatic: There is mild atherosclerotic calcification of the abdominal aorta without aneurysm. There is no gastrohepatic or hepatoduodenal ligament lymphadenopathy. No retroperitoneal or mesenteric lymphadenopathy. No pelvic sidewall lymphadenopathy. Reproductive: The prostate gland is mildly enlarged. Seminal vesicles are unremarkable. Other: No intraperitoneal free fluid. Musculoskeletal: No worrisome lytic or sclerotic osseous abnormality. IMPRESSION: 1. Patchy airspace disease left lower lobe suspicious for pneumonia but incompletely visualized. Patient did have some micro nodularity in this region previously but features appear progressive although, again,  incompletely visualized. 2. No acute findings in the abdomen or pelvis. 3. Aortic Atherosclerosis (ICD10-I70.0). Electronically Signed   By: Misty Stanley M.D.   On: 11/26/2021 11:21  ? ?DG Chest Port 1 View ? ?Re

## 2021-11-29 LAB — URINE CULTURE: Culture: 3000 — AB

## 2021-12-01 ENCOUNTER — Telehealth: Payer: Self-pay

## 2021-12-01 LAB — CULTURE, BLOOD (ROUTINE X 2)
Culture: NO GROWTH
Culture: NO GROWTH

## 2021-12-01 NOTE — Telephone Encounter (Signed)
Transition Care Management Follow-up Telephone Call ?Date of discharge and from where: 11/28/2021 from [facility] ?How have you been since you were released from the hospital? Patient spouse (guardian) stated he is feeling much better. No questions or concerns at this time.  ?Any questions or concerns? No ? ?Items Reviewed: ?Did the pt receive and understand the discharge instructions provided? Yes  ?Medications obtained and verified? Yes  ?Other? No  ?Any new allergies since your discharge? No  ?Dietary orders reviewed? No ?Do you have support at home? Yes  ? ?Functional Questionnaire: (I = Independent and D = Dependent) ?ADLs: I ? ?Bathing/Dressing- I ? ?Meal Prep- I ? ?Eating- I ? ?Maintaining continence- I ? ?Transferring/Ambulation- I ? ?Managing Meds- I ? ? ?Follow up appointments reviewed: ? ?PCP Hospital f/u appt confirmed? Yes  Scheduled to see Loura Pardon, MD on 12/02/2021 @ 3:00 pm. ?Specialist Hospital f/u appt confirmed? No   ?Are transportation arrangements needed? No  ?If their condition worsens, is the pt aware to call PCP or go to the Emergency Dept.? Yes ?Was the patient provided with contact information for the PCP's office or ED? Yes ?Was to pt encouraged to call back with questions or concerns? Yes ? ?

## 2021-12-02 ENCOUNTER — Inpatient Hospital Stay: Payer: Medicaid Other | Admitting: Internal Medicine

## 2021-12-02 ENCOUNTER — Ambulatory Visit: Payer: Medicaid Other | Admitting: Physical Therapy

## 2021-12-04 ENCOUNTER — Encounter: Payer: Medicaid Other | Admitting: Physical Therapy

## 2021-12-09 ENCOUNTER — Encounter: Payer: Medicaid Other | Admitting: Physical Therapy

## 2021-12-09 ENCOUNTER — Ambulatory Visit (INDEPENDENT_AMBULATORY_CARE_PROVIDER_SITE_OTHER): Payer: Medicaid Other | Admitting: Vascular Surgery

## 2021-12-11 ENCOUNTER — Encounter: Payer: Self-pay | Admitting: Podiatry

## 2021-12-11 ENCOUNTER — Ambulatory Visit: Payer: Medicaid Other | Admitting: Podiatry

## 2021-12-11 ENCOUNTER — Encounter: Payer: Medicaid Other | Admitting: Physical Therapy

## 2021-12-11 DIAGNOSIS — M79674 Pain in right toe(s): Secondary | ICD-10-CM | POA: Diagnosis not present

## 2021-12-11 DIAGNOSIS — B351 Tinea unguium: Secondary | ICD-10-CM

## 2021-12-11 DIAGNOSIS — D689 Coagulation defect, unspecified: Secondary | ICD-10-CM | POA: Diagnosis not present

## 2021-12-11 DIAGNOSIS — N182 Chronic kidney disease, stage 2 (mild): Secondary | ICD-10-CM

## 2021-12-11 DIAGNOSIS — E1142 Type 2 diabetes mellitus with diabetic polyneuropathy: Secondary | ICD-10-CM

## 2021-12-11 DIAGNOSIS — M79675 Pain in left toe(s): Secondary | ICD-10-CM | POA: Diagnosis not present

## 2021-12-11 NOTE — Progress Notes (Signed)
This patient returns to my office for at risk foot care.  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 Patient is taking eliquis.  He is accompanied by his daughter..This patient is unable to cut nails himself since the patient cannot reach his nails.These nails are painful walking and wearing shoes.  This patient presents for at risk foot care today. ? ?General Appearance  Alert, conversant and in no acute stress. ? ?Vascular  Dorsalis pedis and posterior tibial  pulses are palpable  bilaterally.  Capillary return is within normal limits  bilaterally. Temperature is within normal limits  bilaterally. ? ?Neurologic  Senn-Weinstein monofilament wire test within normal limits  bilaterally. Muscle power within normal limits bilaterally. ? ?Nails Thick disfigured discolored nails with subungual debris  from hallux to fifth toes bilaterally. No evidence of bacterial infection or drainage bilaterally. ? ?Orthopedic  No limitations of motion  feet .  No crepitus or effusions noted.  No bony pathology or digital deformities noted. ? ?Skin  normotropic skin with no porokeratosis noted bilaterally.  No signs of infections or ulcers noted.  White cauliflower appearing tissue in arch right foot.  Asymptomatic.  ? ?Onychomycosis  Pain in right toes  Pain in left toes ? ?Consent was obtained for treatment procedures.   Mechanical debridement of nails 1-5  bilaterally performed with a nail nipper.  Filed with dremel without incident. No infection or ulcer.   ? ? ?Return office visit   16 weeks                  Told patient to return for periodic foot care and evaluation due to potential at risk complications. ? ? ?Helane Gunther DPM  ?

## 2021-12-16 ENCOUNTER — Encounter: Payer: Medicaid Other | Admitting: Physical Therapy

## 2021-12-18 ENCOUNTER — Encounter: Payer: Medicaid Other | Admitting: Physical Therapy

## 2021-12-18 ENCOUNTER — Encounter: Payer: Self-pay | Admitting: Internal Medicine

## 2021-12-18 ENCOUNTER — Ambulatory Visit: Payer: Medicaid Other | Admitting: Internal Medicine

## 2021-12-18 ENCOUNTER — Institutional Professional Consult (permissible substitution): Payer: Medicaid Other | Admitting: Internal Medicine

## 2021-12-18 VITALS — BP 160/87 | HR 56 | Ht 65.98 in | Wt 144.6 lb

## 2021-12-18 DIAGNOSIS — I441 Atrioventricular block, second degree: Secondary | ICD-10-CM

## 2021-12-18 DIAGNOSIS — R7989 Other specified abnormal findings of blood chemistry: Secondary | ICD-10-CM

## 2021-12-18 DIAGNOSIS — R001 Bradycardia, unspecified: Secondary | ICD-10-CM

## 2021-12-18 DIAGNOSIS — D649 Anemia, unspecified: Secondary | ICD-10-CM | POA: Diagnosis not present

## 2021-12-18 DIAGNOSIS — I10 Essential (primary) hypertension: Secondary | ICD-10-CM

## 2021-12-18 DIAGNOSIS — I480 Paroxysmal atrial fibrillation: Secondary | ICD-10-CM

## 2021-12-18 NOTE — Progress Notes (Incomplete)
? ? ? ? ?ELECTROPHYSIOLOGY CONSULT NOTE  ?Patient ID: Evan Congrove., MRN: [id number], DOB/AGE: [birth year] 66 y.o. ?Admit date: (Not on file) ?Date of Consult: 12/18/2021 ? ?Primary Physician: Loura Pardon, MD ?Primary Cardiologist: *** ?  ?  ?Evan MERLO Sr. is a 66 y.o. male who is being seen today for the evaluation of *** at the request of ***.  ? ? ?HPI ?Evan Ano Sr. is a 66 y.o. male *** ? ?Hospitalized 4/23 with dizziness shortness of breath and found to have left lower lobe pneumonia with sepsis complicated by acute kidney injury; apparently had bradycardia and was referred from the hospital, although not seen by cardiology in the hospital ? ? ?DATE TEST EF   ?1/21 Myoview   55 % No ischemia  ?1/21 Echo     50-55%   ?3/23 Echo  50-55%   ? ?Date Cr K Hgb  ?4/23 1.27<<1.57 3.89<<3.2  8.9  ?***/*** *** *** ***  ? ?Thromboembolic risk factors (, HTN-1, TIA/CVA-2, DM-1) for a CHADSVASc Score of >=4 ? ?Event Recorder personally reviewed sinus bradycardia with second-degree AV block type I; associated with PP shortening at the end of the event. ? ?Past Medical History:  ?Diagnosis Date  ? Carotid arterial disease (HCC)   ? a. 08/2018 Carotid U/S: min-mod RICA atherosclerosis w/o hemodynamically significant stenosis. Nl LICA.  ? Diabetes 1.5, managed as type 2 (HCC)   ? Diastolic dysfunction   ? a. 08/2018 Echo: EF 65%. No rwma. Gr1 DD. Mild MR.  ? Diastolic dysfunction   ? a. 08/2019 Echo: EF 55-60%, Gr1 DD. No rwma. Mild MR. RVSP 37.72mmHg.  ? Hypercholesterolemia   ? Hypertension   ? PAF (paroxysmal atrial fibrillation) (HCC)   ? a. 10/2019 Event Monitor: PAF; b. CHA2DS2VASc = 5-->Eliquis.  ? Poorly controlled diabetes mellitus (HCC)   ? a. 04/2019 A1c 13.8.  ? Recurrent strokes (HCC)   ? a. 10/2016 MRI/A: Acute 39mm R thalamic infarct, ? subacute infarct of R corona radiata; b. 08/2017 MRI/A: Acute 95mm lateral L thalamic infarct. Other more remote lacunar infarcts of thalami bilat. Small vessel dzs; c.  08/2018 MRI/A: Acute lacunar infarct of the post limb of R internal capsule; d. 08/2019 MRI Acute CVA of L paramedian pons adn R cerebellar hemisphere.  ? Tobacco abuse   ?   ? ?Surgical History:  ?Past Surgical History:  ?Procedure Laterality Date  ? ESOPHAGOGASTRODUODENOSCOPY (EGD) WITH PROPOFOL N/A 04/26/2019  ? Procedure: ESOPHAGOGASTRODUODENOSCOPY (EGD) WITH PROPOFOL;  Surgeon: Toney Reil, MD;  [facility] END[facility]COPY;  Service: Gastroenterology;  Laterality: N/A;  ? NO PAST SURGERIES    ?  ? ?Home Meds: ?No outpatient medications have been marked as taking for the 12/18/21 encounter (Appointment) with Duke Salvia, MD.  ? ? ?Allergies: No Known Allergies ? ?Social History  ? ?Socioeconomic History  ? Marital status: Married  ?  Spouse name: beverly Daleo  ? Number of children: 5  ? Years of education: Not on file  ? Highest education level: Not on file  ?Occupational History  ? Occupation: unemployed  ?Tobacco Use  ? Smoking status: Former  ?  Packs/day: 1.00  ?  Years: 38.00  ?  Pack years: 38.00  ?  Types: Cigarettes  ?  Start date: 75  ? Smokeless tobacco: Never  ?Vaping Use  ? Vaping Use: Never used  ?Substance and Sexual Activity  ? Alcohol use: Not Currently  ?  Alcohol/week: 1.0 standard drink  ?  Types: 1 Cans of beer per week  ?  Comment: previously drank but nothing in 1-2 yrs (08/2019).  ? Drug use: Not Currently  ?  Types: Cocaine, Marijuana  ?  Comment: prev used cocaine/marijuana but none x 1-2 yrs (08/2019).  ? Sexual activity: Yes  ?Other Topics Concern  ? Not on file  ?Social History Narrative  ? Lives in [place] with his wife.  Uses a cane when ambulating.  Can walk about 25 yds prior to having to rest - says he stumbles if he has to work further than that.  Usually uses a scooter to get around stores.  ? ?Social Determinants of Health  ? ?Financial Resource Strain: Not on file  ?Food Insecurity: Not on file  ?Transportation Needs: Not on file  ?Physical Activity: Not on file   ?Stress: Not on file  ?Social Connections: Not on file  ?Intimate Partner Violence: Not on file  ?  ? ?Family History  ?Problem Relation Age of Onset  ? Hypertension Mother   ? Stroke Mother   ?     died @ age 42  ? Hypertension Father   ? Diabetes Father   ? Heart attack Father   ?     died @ 3  ?  ? ?ROS:  Please see the history of present illness.   {ros master:[id number]}  All other systems reviewed and negative.  ? ? ?Physical Exam:*** ?There were no vitals taken for this visit. ?General: Well developed, well nourished male in no acute distress. ?Head: Normocephalic, atraumatic, sclera non-icteric, no xanthomas, nares are without discharge. ?EENT: normal  ?Lymph Nodes:  none ?Neck: Negative for carotid bruits. JVD not elevated. ?Back:without scoliosis kyphosis*** ?Lungs: Clear bilaterally to auscultation without wheezes, rales, or rhonchi. Breathing is unlabored. ?Heart: RRR with S1 S2. No *** ***/6 systolic*** murmur . No rubs, or gallops appreciated. ?Abdomen: Soft, non-tender, non-distended with normoactive bowel sounds. No hepatomegaly. No rebound/guarding. No obvious abdominal masses. ?Msk:  Strength and tone appear normal for age. ?Extremities: No clubbing or cyanosis. No*** ***+*** edema.  Distal pedal pulses are 2+ and equal bilaterally. ?Skin: Warm and Dry ?Neuro: Alert and oriented X 3. CN III-XII intact Grossly normal sensory and motor function . ?Psych:  Responds to questions appropriately with a normal affect. ?  ?  ?  ? ?EKG: *** ? ? ?Assessment and Plan:  ?Atrial fibrillation ? ?Anticoagulation ? ?Anemia acute on chronic ? ?Hypertension ? ? ? ?Sherryl Manges  ?

## 2021-12-18 NOTE — Progress Notes (Signed)
? ?BP (!) 160/87   Pulse (!) 56   Ht 5' 5.98" (1.676 m)   Wt 144 lb 9.6 oz (65.6 kg)   SpO2 98%   BMI 23.35 kg/m?   ? ?Subjective:  ? ? Patient ID: Evan MATLACK Sr., male    DOB: 1956-01-15, 66 y.o.   MRN: [id number] ? ?Chief Complaint  ?Patient presents with  ? Hospitalization Follow-up  ? PT referral  ? ? ?HPI: ?Evan AXEL Sr. is a 66 y.o. male ? ?Pt is here for a hospital follow up. He was admitted for a LLL pneumonia on 4/14 no fu since then he was diagnosed with ARF ? Sec to dehydration from his sepsis.  ?He is better now, No cough ,sob per wife or pt.  ?Of note pt has had abnl elevated Lfts denies any abdominal pain / changes in stool  ? ? ?Chief Complaint  ?Patient presents with  ? Hospitalization Follow-up  ? PT referral  ? ? ?Relevant past medical, surgical, family and social history reviewed and updated as indicated. Interim medical history since our last visit reviewed. ?Allergies and medications reviewed and updated. ? ?Review of Systems ? ?Per HPI unless specifically indicated above ? ?   ?Objective:  ?  ?BP (!) 160/87   Pulse (!) 56   Ht 5' 5.98" (1.676 m)   Wt 144 lb 9.6 oz (65.6 kg)   SpO2 98%   BMI 23.35 kg/m?   ?Wt Readings from Last 3 Encounters:  ?12/18/21 144 lb 9.6 oz (65.6 kg)  ?11/26/21 150 lb (68 kg)  ?11/20/21 142 lb (64.4 kg)  ?  ?Physical Exam ?Vitals and nursing note reviewed.  ?Constitutional:   ?   General: He is not in acute distress. ?   Appearance: Normal appearance. He is not ill-appearing or diaphoretic.  ?HENT:  ?   Head: Normocephalic and atraumatic.  ?   Right Ear: Tympanic membrane and external ear normal. There is no impacted cerumen.  ?   Left Ear: External ear normal.  ?   Nose: No congestion or rhinorrhea.  ?   Mouth/Throat:  ?   Pharynx: No oropharyngeal exudate or posterior oropharyngeal erythema.  ?Eyes:  ?   Conjunctiva/sclera: Conjunctivae normal.  ?   Pupils: Pupils are equal, round, and reactive to light.  ?Cardiovascular:  ?   Rate and Rhythm:  Normal rate and regular rhythm.  ?Abdominal:  ?   General: Abdomen is flat. Bowel sounds are normal.  ?   Palpations: Abdomen is soft. There is no mass.  ?   Tenderness: There is no abdominal tenderness.  ?Musculoskeletal:     ?   General: No swelling, tenderness, deformity or signs of injury.  ?   Cervical back: Normal range of motion and neck supple. No rigidity or tenderness.  ?   Right lower leg: No edema.  ?   Left lower leg: No edema.  ?Skin: ?   General: Skin is warm and dry.  ?   Coloration: Skin is not jaundiced or pale.  ?Neurological:  ?   Mental Status: He is alert.  ?   Cranial Nerves: No cranial nerve deficit.  ?   Motor: Weakness present.  ?   Coordination: Coordination abnormal.  ?   Gait: Gait abnormal.  ? ? ?Results for orders placed or performed in visit on 12/18/21  ?Microscopic Examination  ? Urine  ?Result Value Ref Range  ? WBC, UA None seen 0 - 5 /hpf  ?  RBC 0-2 0 - 2 /hpf  ? Epithelial Cells (non renal) None seen 0 - 10 /hpf  ? Mucus, UA Present (A) Not Estab.  ? Bacteria, UA None seen None seen/Few  ?Urinalysis, Routine w reflex microscopic  ?Result Value Ref Range  ? Specific Gravity, UA 1.025 1.005 - 1.030  ? pH, UA 5.5 5.0 - 7.5  ? Color, UA Yellow Yellow  ? Appearance Ur Clear Clear  ? Leukocytes,UA Negative Negative  ? Protein,UA 3+ (A) Negative/Trace  ? Glucose, UA 3+ (A) Negative  ? Ketones, UA Negative Negative  ? RBC, UA 2+ (A) Negative  ? Bilirubin, UA Negative Negative  ? Urobilinogen, Ur 0.2 0.2 - 1.0 mg/dL  ? Nitrite, UA Negative Negative  ? Microscopic Examination See below:   ?CBC With Differential/Platelet  ?Result Value Ref Range  ? WBC 3.9 3.4 - 10.8 x10E3/uL  ? RBC 3.67 (L) 4.14 - 5.80 x10E6/uL  ? Hemoglobin 10.0 (L) 13.0 - 17.7 g/dL  ? Hematocrit 32.1 (L) 37.5 - 51.0 %  ? MCV 88 79 - 97 fL  ? MCH 27.2 26.6 - 33.0 pg  ? MCHC 31.2 (L) 31.5 - 35.7 g/dL  ? RDW 17.4 (H) 11.6 - 15.4 %  ? Platelets 252 150 - 450 x10E3/uL  ? Neutrophils 49 Not Estab. %  ? Lymphs 45 Not Estab. %   ? MID 6 Not Estab. %  ? Neutrophils Absolute 2.0 1.4 - 7.0 x10E3/uL  ? Lymphocytes Absolute 1.7 0.7 - 3.1 x10E3/uL  ? MID (Absolute) 0.2 0.1 - 1.6 X10E3/uL  ? Hematology Comments: WILL FOLLOW   ? ?   ? ? ?Current Outpatient Medications:  ?  ACCU-CHEK GUIDE test strip, Check fsbs once daily, Disp: 100 each, Rfl: 2 ?  Accu-Chek Softclix Lancets lancets, SMARTSIG:Topical, Disp: 100 each, Rfl: 1 ?  albuterol (VENTOLIN HFA) 108 (90 Base) MCG/ACT inhaler, Inhale 2 puffs into the lungs every 6 (six) hours as needed for wheezing or shortness of breath., Disp: 8 g, Rfl: 1 ?  amLODipine (NORVASC) 10 MG tablet, Take 1 tablet (10 mg total) by mouth daily., Disp: 90 tablet, Rfl: 1 ?  apixaban (ELIQUIS) 5 MG TABS tablet, Take 1 tablet (5 mg total) by mouth 2 (two) times daily., Disp: 60 tablet, Rfl: 0 ?  atorvastatin (LIPITOR) 80 MG tablet, Take 1 tablet (80 mg total) by mouth daily. (Patient taking differently: Take 80 mg by mouth every evening.), Disp: 90 tablet, Rfl: 1 ?  baclofen (LIORESAL) 10 MG tablet, Take 1 tablet (10 mg total) by mouth 3 (three) times daily., Disp: 90 each, Rfl: 1 ?  Blood Glucose Monitoring Suppl KIT, Accucheck Guide with lancets and test strips #100 with one refill.  Test blood sugar twice daily. DX Code E11.65 ; Z79.4, Disp: 1 kit, Rfl: 0 ?  Continuous Blood Gluc Sensor (FREESTYLE LIBRE 2 SENSOR) MISC, 1 each by Does not apply route every 14 (fourteen) days., Disp: 6 each, Rfl: 1 ?  dapagliflozin propanediol (FARXIGA) 10 MG TABS tablet, Take 1 tablet (10 mg total) by mouth daily before breakfast. In place of glipizide, Disp: 30 tablet, Rfl: 0 ?  Incontinence Supply Disposable (COMFORT PROTECT ADULT DIAPER/L) MISC, 1 Units by Does not apply route daily. Use 3-4 a day as needed, Disp: 90 each, Rfl: 3 ?  Insulin Pen Needle 32G X 6 MM MISC, Daily, Disp: 100 each, Rfl: 3 ?  metoCLOPramide (REGLAN) 5 MG tablet, TAKE 1 TABLET BY MOUTH 3 TIMES DAILY BEFORE MEALS, Disp:  90 tablet, Rfl: 0 ?  Misc. Devices  MISC, One pair of Compression stockings  Hemiplegia and hemiparesis following cerebral infarction affecting left non-dominant side (HCC)  - Primary Codes: G87.195 Lower extremity edema  Codes: R60.0 Type 2 diabetes mellitus with hyperglycemia, with long-term current use of insulin (HCC)  Codes: E11.65, Z79.4, Disp: 1 Units, Rfl: 0 ?  nitroGLYCERIN (NITROSTAT) 0.4 MG SL tablet, Place 1 tablet (0.4 mg total) under the tongue every 5 (five) minutes as needed for chest pain., Disp: 30 tablet, Rfl: 0 ?  omeprazole (PRILOSEC) 40 MG capsule, TAKE 1 CAPSULE BY MOUTH IN THE MORNING AND AT BEDTIME (Patient taking differently: Take 40 mg by mouth in the morning and at bedtime.), Disp: 180 capsule, Rfl: 1 ?  tamsulosin (FLOMAX) 0.4 MG CAPS capsule, Take 1 capsule (0.4 mg total) by mouth daily., Disp: 30 capsule, Rfl: 0  ? ? ?Assessment & Plan:  ?Hospital fu:   ?Stable, no acute findings noted on exam.  ?Feels well  ?ARF - recheck labs  ?Most likely improved sec to him being late to this visit.  ?Will need to fu with cards as well Pulse rate continues to be low at 56.  ? ?Elevated LFTs ?? Etiology  ?Will need to recheck and refer to GI  ?CT abdoemn and pelvis wnl as in the ER ?IMPRESSION: ?1. Patchy airspace disease left lower lobe suspicious for pneumonia ?but incompletely visualized. Patient did have some micro nodularity ?in this region previously but features appear progressive although, ?again, incompletely visualized. ?2. No acute findings in the abdomen or pelvis. ?3. Aortic Atherosclerosis (ICD10-I70.0). ?  ?Hypoalbuminemia :  ?Will need to increase protein intake  ?Has had ensure rx doenst drink it per pt ? Latest Reference Range & Units 11/26/21 09:40 11/28/21 06:10  ?Albumin 3.5 - 5.0 g/dL 3.3 (L) 3.2 (L)  ?(L): Data is abnormally low ? ? ?Anemia improving ? Cause ?Will check labs including  iron studies, b12 , folate  ?. pt deneis symptoms. adviced to contact./ call clinic if symptoms worsen. ? ?Problem List Items  Addressed This Visit   ?None ?Visit Diagnoses   ? ? Elevated LFTs    -  Primary  ? Relevant Orders  ? CBC with Differential/Platelet  ? Comprehensive metabolic panel  ? Urinalysis, Routine w reflex microscopic (Completed

## 2021-12-19 ENCOUNTER — Telehealth: Payer: Self-pay

## 2021-12-19 DIAGNOSIS — D649 Anemia, unspecified: Secondary | ICD-10-CM

## 2021-12-19 DIAGNOSIS — R7989 Other specified abnormal findings of blood chemistry: Secondary | ICD-10-CM

## 2021-12-19 LAB — COMPREHENSIVE METABOLIC PANEL
ALT: 82 IU/L — ABNORMAL HIGH (ref 0–44)
AST: 54 IU/L — ABNORMAL HIGH (ref 0–40)
Albumin/Globulin Ratio: 1.3 (ref 1.2–2.2)
Albumin: 4.1 g/dL (ref 3.8–4.8)
Alkaline Phosphatase: 211 IU/L — ABNORMAL HIGH (ref 44–121)
BUN/Creatinine Ratio: 15 (ref 10–24)
BUN: 22 mg/dL (ref 8–27)
Bilirubin Total: <0.2 mg/dL (ref 0.0–1.2)
CO2: 20 mmol/L (ref 20–29)
Calcium: 9.4 mg/dL (ref 8.6–10.2)
Chloride: 109 mmol/L — ABNORMAL HIGH (ref 96–106)
Creatinine, Ser: 1.42 mg/dL — ABNORMAL HIGH (ref 0.76–1.27)
Globulin, Total: 3.2 g/dL (ref 1.5–4.5)
Glucose: 125 mg/dL — ABNORMAL HIGH (ref 70–99)
Potassium: 4.3 mmol/L (ref 3.5–5.2)
Sodium: 145 mmol/L — ABNORMAL HIGH (ref 134–144)
Total Protein: 7.3 g/dL (ref 6.0–8.5)
eGFR: 55 mL/min/{1.73_m2} — ABNORMAL LOW (ref >59)

## 2021-12-19 LAB — CBC WITH DIFFERENTIAL/PLATELET
Basophils Absolute: 0 10*3/uL (ref 0.0–0.2)
Basos: 1 %
EOS (ABSOLUTE): 0.1 10*3/uL (ref 0.0–0.4)
Eos: 2 %
Hematocrit: 29.8 % — ABNORMAL LOW (ref 37.5–51.0)
Hematocrit: 32.1 % — ABNORMAL LOW (ref 37.5–51.0)
Hemoglobin: 10 g/dL — ABNORMAL LOW (ref 13.0–17.7)
Hemoglobin: 10.1 g/dL — ABNORMAL LOW (ref 13.0–17.7)
Immature Grans (Abs): 0 10*3/uL (ref 0.0–0.1)
Immature Granulocytes: 1 %
Lymphocytes Absolute: 1.7 10*3/uL (ref 0.7–3.1)
Lymphocytes Absolute: 1.7 10*3/uL (ref 0.7–3.1)
Lymphs: 44 %
Lymphs: 45 %
MCH: 27.2 pg (ref 26.6–33.0)
MCH: 29 pg (ref 26.6–33.0)
MCHC: 31.2 g/dL — ABNORMAL LOW (ref 31.5–35.7)
MCHC: 33.9 g/dL (ref 31.5–35.7)
MCV: 86 fL (ref 79–97)
MCV: 88 fL (ref 79–97)
MID (Absolute): 0.2 10*3/uL (ref 0.1–1.6)
MID: 6 %
Monocytes Absolute: 0.3 10*3/uL (ref 0.1–0.9)
Monocytes: 8 %
NRBC: 1 % — ABNORMAL HIGH (ref 0–0)
Neutrophils Absolute: 1.7 10*3/uL (ref 1.4–7.0)
Neutrophils Absolute: 2 10*3/uL (ref 1.4–7.0)
Neutrophils: 44 %
Neutrophils: 49 %
Platelets: 249 10*3/uL (ref 150–450)
Platelets: 252 10*3/uL (ref 150–450)
RBC: 3.48 x10E6/uL — ABNORMAL LOW (ref 4.14–5.80)
RBC: 3.67 x10E6/uL — ABNORMAL LOW (ref 4.14–5.80)
RDW: 15.4 % (ref 11.6–15.4)
RDW: 17.4 % — ABNORMAL HIGH (ref 11.6–15.4)
WBC: 3.8 10*3/uL (ref 3.4–10.8)
WBC: 3.9 10*3/uL (ref 3.4–10.8)

## 2021-12-19 LAB — URINALYSIS, ROUTINE W REFLEX MICROSCOPIC
Bilirubin, UA: NEGATIVE
Ketones, UA: NEGATIVE
Leukocytes,UA: NEGATIVE
Nitrite, UA: NEGATIVE
Specific Gravity, UA: 1.025 (ref 1.005–1.030)
Urobilinogen, Ur: 0.2 mg/dL (ref 0.2–1.0)
pH, UA: 5.5 (ref 5.0–7.5)

## 2021-12-19 LAB — MICROSCOPIC EXAMINATION
Bacteria, UA: NONE SEEN
Epithelial Cells (non renal): NONE SEEN /hpf (ref 0–10)
WBC, UA: NONE SEEN /hpf (ref 0–5)

## 2021-12-19 NOTE — Telephone Encounter (Signed)
Stat order for ref to GI for elevated LFT's ?

## 2021-12-19 NOTE — Addendum Note (Signed)
Addended byLoura Pardon on: 12/19/2021 10:53 AM ? ? Modules accepted: Orders ? ?

## 2021-12-22 ENCOUNTER — Ambulatory Visit: Payer: Medicaid Other | Admitting: Physician Assistant

## 2021-12-23 ENCOUNTER — Encounter: Payer: Medicaid Other | Admitting: Physical Therapy

## 2021-12-24 ENCOUNTER — Other Ambulatory Visit: Payer: Self-pay | Admitting: Nurse Practitioner

## 2021-12-24 ENCOUNTER — Ambulatory Visit: Payer: Medicaid Other | Admitting: Gastroenterology

## 2021-12-24 DIAGNOSIS — E1143 Type 2 diabetes mellitus with diabetic autonomic (poly)neuropathy: Secondary | ICD-10-CM

## 2021-12-24 NOTE — Telephone Encounter (Signed)
Requested medications are due for refill today.  yes ? ?Requested medications are on the active medications list.  yes ? ?Last refill. 11/22/2021 #90 0 refills ? ?Future visit scheduled.   yes ? ?Notes to clinic.  Medication refill is not delegated. ? ? ? ?Requested Prescriptions  ?Pending Prescriptions Disp Refills  ? metoCLOPramide (REGLAN) 5 MG tablet [Pharmacy Med Name: METOCLOPRAMIDE HCL 5 MG TAB] 90 tablet 0  ?  Sig: TAKE 1 TABLET BY MOUTH 3 TIMES DAILY BEFORE MEALS  ?  ? Not Delegated - Gastroenterology: Antiemetics - metoclopramide Failed - 12/24/2021 11:05 AM  ?  ?  Failed - This refill cannot be delegated  ?  ?  Failed - Cr in normal range and within 360 days  ?  Creat  ?Date Value Ref Range Status  ?05/16/2020 1.38 (H) 0.70 - 1.25 mg/dL Final  ?  Comment:  ?  For patients >37 years of age, the reference limit ?for Creatinine is approximately 13% higher for people ?identified as African-American. ?. ?  ? ?Creatinine, Ser  ?Date Value Ref Range Status  ?12/18/2021 1.42 (H) 0.76 - 1.27 mg/dL Final  ? ?Creatinine, Urine  ?Date Value Ref Range Status  ?01/31/2019 96 20 - 320 mg/dL Final  ?  ?  ?  ?  Passed - Valid encounter within last 6 months  ?  Recent Outpatient Visits   ? ?      ? 6 days ago Elevated LFTs  ? [facility] Vigg, Avanti, MD  ? 1 month ago Essential hypertension  ? [facility] Vigg, Avanti, MD  ? 2 months ago Bradycardia  ? [facility] Vigg, Avanti, MD  ? 4 months ago Diabetes mellitus without complication (HCC)  ? [facility] Vigg, Avanti, MD  ? 5 months ago Controlled type 2 diabetes mellitus with complication, without long-term current use of insulin (HCC)  ? [facility] Vigg, Avanti, MD  ? ?  ?  ?Future Appointments   ? ?        ? In 2 weeks Vigg, Avanti, MD [facility]  ? In 1 month Furth, Cadence H, PA-C [facility]  ? ?  ? ? ?  ?  ?  ?  ?

## 2021-12-25 ENCOUNTER — Encounter: Payer: Medicaid Other | Admitting: Physical Therapy

## 2021-12-28 ENCOUNTER — Emergency Department: Payer: Medicaid Other

## 2021-12-28 ENCOUNTER — Observation Stay
Admission: EM | Admit: 2021-12-28 | Discharge: 2021-12-29 | Disposition: A | Discharge status: Home / Self Care | Payer: Medicaid Other | Attending: Internal Medicine | Admitting: Internal Medicine

## 2021-12-28 DIAGNOSIS — N1330 Unspecified hydronephrosis: Secondary | ICD-10-CM | POA: Diagnosis not present

## 2021-12-28 DIAGNOSIS — Z87891 Personal history of nicotine dependence: Secondary | ICD-10-CM | POA: Diagnosis not present

## 2021-12-28 DIAGNOSIS — Z7901 Long term (current) use of anticoagulants: Secondary | ICD-10-CM | POA: Insufficient documentation

## 2021-12-28 DIAGNOSIS — Z794 Long term (current) use of insulin: Secondary | ICD-10-CM | POA: Diagnosis not present

## 2021-12-28 DIAGNOSIS — I482 Chronic atrial fibrillation, unspecified: Secondary | ICD-10-CM | POA: Diagnosis not present

## 2021-12-28 DIAGNOSIS — I480 Paroxysmal atrial fibrillation: Secondary | ICD-10-CM | POA: Diagnosis not present

## 2021-12-28 DIAGNOSIS — R4781 Slurred speech: Secondary | ICD-10-CM | POA: Diagnosis not present

## 2021-12-28 DIAGNOSIS — R531 Weakness: Secondary | ICD-10-CM | POA: Diagnosis not present

## 2021-12-28 DIAGNOSIS — R7401 Elevation of levels of liver transaminase levels: Secondary | ICD-10-CM | POA: Insufficient documentation

## 2021-12-28 DIAGNOSIS — R4182 Altered mental status, unspecified: Secondary | ICD-10-CM | POA: Diagnosis not present

## 2021-12-28 DIAGNOSIS — I639 Cerebral infarction, unspecified: Secondary | ICD-10-CM | POA: Diagnosis present

## 2021-12-28 DIAGNOSIS — E1122 Type 2 diabetes mellitus with diabetic chronic kidney disease: Secondary | ICD-10-CM | POA: Insufficient documentation

## 2021-12-28 DIAGNOSIS — Z79899 Other long term (current) drug therapy: Secondary | ICD-10-CM | POA: Diagnosis not present

## 2021-12-28 DIAGNOSIS — Z20822 Contact with and (suspected) exposure to covid-19: Secondary | ICD-10-CM | POA: Insufficient documentation

## 2021-12-28 DIAGNOSIS — E1165 Type 2 diabetes mellitus with hyperglycemia: Secondary | ICD-10-CM | POA: Insufficient documentation

## 2021-12-28 DIAGNOSIS — N179 Acute kidney failure, unspecified: Secondary | ICD-10-CM

## 2021-12-28 DIAGNOSIS — I4510 Unspecified right bundle-branch block: Secondary | ICD-10-CM | POA: Diagnosis not present

## 2021-12-28 DIAGNOSIS — R2990 Unspecified symptoms and signs involving the nervous system: Secondary | ICD-10-CM

## 2021-12-28 DIAGNOSIS — I440 Atrioventricular block, first degree: Secondary | ICD-10-CM | POA: Diagnosis not present

## 2021-12-28 DIAGNOSIS — R748 Abnormal levels of other serum enzymes: Secondary | ICD-10-CM

## 2021-12-28 DIAGNOSIS — G9340 Encephalopathy, unspecified: Secondary | ICD-10-CM | POA: Diagnosis not present

## 2021-12-28 DIAGNOSIS — R1011 Right upper quadrant pain: Secondary | ICD-10-CM | POA: Diagnosis not present

## 2021-12-28 DIAGNOSIS — I10 Essential (primary) hypertension: Secondary | ICD-10-CM | POA: Diagnosis not present

## 2021-12-28 DIAGNOSIS — R001 Bradycardia, unspecified: Secondary | ICD-10-CM | POA: Diagnosis present

## 2021-12-28 DIAGNOSIS — R197 Diarrhea, unspecified: Secondary | ICD-10-CM | POA: Diagnosis not present

## 2021-12-28 DIAGNOSIS — N189 Chronic kidney disease, unspecified: Secondary | ICD-10-CM | POA: Diagnosis not present

## 2021-12-28 DIAGNOSIS — I129 Hypertensive chronic kidney disease with stage 1 through stage 4 chronic kidney disease, or unspecified chronic kidney disease: Secondary | ICD-10-CM | POA: Insufficient documentation

## 2021-12-28 DIAGNOSIS — R410 Disorientation, unspecified: Secondary | ICD-10-CM | POA: Diagnosis not present

## 2021-12-28 DIAGNOSIS — R404 Transient alteration of awareness: Secondary | ICD-10-CM | POA: Diagnosis not present

## 2021-12-28 DIAGNOSIS — Z8673 Personal history of transient ischemic attack (TIA), and cerebral infarction without residual deficits: Secondary | ICD-10-CM | POA: Diagnosis not present

## 2021-12-28 DIAGNOSIS — R29810 Facial weakness: Secondary | ICD-10-CM | POA: Diagnosis not present

## 2021-12-28 DIAGNOSIS — I63231 Cerebral infarction due to unspecified occlusion or stenosis of right carotid arteries: Secondary | ICD-10-CM | POA: Diagnosis not present

## 2021-12-28 LAB — COMPREHENSIVE METABOLIC PANEL
ALT: 121 U/L — ABNORMAL HIGH (ref 0–44)
AST: 95 U/L — ABNORMAL HIGH (ref 15–41)
Albumin: 3.3 g/dL — ABNORMAL LOW (ref 3.5–5.0)
Alkaline Phosphatase: 155 U/L — ABNORMAL HIGH (ref 38–126)
Anion gap: 5 (ref 5–15)
BUN: 30 mg/dL — ABNORMAL HIGH (ref 8–23)
CO2: 26 mmol/L (ref 22–32)
Calcium: 8.9 mg/dL (ref 8.9–10.3)
Chloride: 114 mmol/L — ABNORMAL HIGH (ref 98–111)
Creatinine, Ser: 1.79 mg/dL — ABNORMAL HIGH (ref 0.61–1.24)
GFR, Estimated: 42 mL/min — ABNORMAL LOW (ref >60)
Glucose, Bld: 104 mg/dL — ABNORMAL HIGH (ref 70–99)
Potassium: 3.6 mmol/L (ref 3.5–5.1)
Sodium: 145 mmol/L (ref 135–145)
Total Bilirubin: 0.3 mg/dL (ref 0.3–1.2)
Total Protein: 7.3 g/dL (ref 6.5–8.1)

## 2021-12-28 LAB — URINALYSIS, ROUTINE W REFLEX MICROSCOPIC
Bilirubin Urine: NEGATIVE
Glucose, UA: >=500 mg/dL — AB
Ketones, ur: NEGATIVE mg/dL
Leukocytes,Ua: NEGATIVE
Nitrite: NEGATIVE
Protein, ur: 100 mg/dL — AB
Specific Gravity, Urine: 1.022 (ref 1.005–1.030)
Squamous Epithelial / HPF: NONE SEEN (ref 0–5)
pH: 5 (ref 5.0–8.0)

## 2021-12-28 LAB — BLOOD GAS, VENOUS
Acid-Base Excess: 3.1 mmol/L — ABNORMAL HIGH (ref 0.0–2.0)
Bicarbonate: 29.5 mmol/L — ABNORMAL HIGH (ref 20.0–28.0)
O2 Saturation: 91.7 %
Patient temperature: 37
pCO2, Ven: 51 mmHg (ref 44–60)
pH, Ven: 7.37 (ref 7.25–7.43)
pO2, Ven: 67 mmHg — ABNORMAL HIGH (ref 32–45)

## 2021-12-28 LAB — CBC WITH DIFFERENTIAL/PLATELET
Abs Immature Granulocytes: 0.01 10*3/uL (ref 0.00–0.07)
Basophils Absolute: 0 10*3/uL (ref 0.0–0.1)
Basophils Relative: 0 %
Eosinophils Absolute: 0 10*3/uL (ref 0.0–0.5)
Eosinophils Relative: 1 %
HCT: 30.6 % — ABNORMAL LOW (ref 39.0–52.0)
Hemoglobin: 9.2 g/dL — ABNORMAL LOW (ref 13.0–17.0)
Immature Granulocytes: 0 %
Lymphocytes Relative: 37 %
Lymphs Abs: 1.8 10*3/uL (ref 0.7–4.0)
MCH: 27.2 pg (ref 26.0–34.0)
MCHC: 30.1 g/dL (ref 30.0–36.0)
MCV: 90.5 fL (ref 80.0–100.0)
Monocytes Absolute: 0.4 10*3/uL (ref 0.1–1.0)
Monocytes Relative: 8 %
Neutro Abs: 2.6 10*3/uL (ref 1.7–7.7)
Neutrophils Relative %: 54 %
Platelets: 202 10*3/uL (ref 150–400)
RBC: 3.38 MIL/uL — ABNORMAL LOW (ref 4.22–5.81)
RDW: 17.2 % — ABNORMAL HIGH (ref 11.5–15.5)
WBC: 4.8 10*3/uL (ref 4.0–10.5)
nRBC: 0.4 % — ABNORMAL HIGH (ref 0.0–0.2)

## 2021-12-28 LAB — URINE DRUG SCREEN, QUALITATIVE (ARMC ONLY)
Amphetamines, Ur Screen: NOT DETECTED
Barbiturates, Ur Screen: NOT DETECTED
Benzodiazepine, Ur Scrn: NOT DETECTED
Cannabinoid 50 Ng, Ur ~~LOC~~: NOT DETECTED
Cocaine Metabolite,Ur ~~LOC~~: NOT DETECTED
MDMA (Ecstasy)Ur Screen: NOT DETECTED
Methadone Scn, Ur: NOT DETECTED
Opiate, Ur Screen: NOT DETECTED
Phencyclidine (PCP) Ur S: NOT DETECTED
Tricyclic, Ur Screen: NOT DETECTED

## 2021-12-28 LAB — CBG MONITORING, ED
Glucose-Capillary: 75 mg/dL (ref 70–99)
Glucose-Capillary: 77 mg/dL (ref 70–99)
Glucose-Capillary: 96 mg/dL (ref 70–99)

## 2021-12-28 LAB — TSH: TSH: 1.01 u[IU]/mL (ref 0.350–4.500)

## 2021-12-28 LAB — PROTIME-INR
INR: 1.5 — ABNORMAL HIGH (ref 0.8–1.2)
Prothrombin Time: 18.4 seconds — ABNORMAL HIGH (ref 11.4–15.2)

## 2021-12-28 LAB — AMMONIA: Ammonia: 34 umol/L (ref 9–35)

## 2021-12-28 LAB — RESP PANEL BY RT-PCR (FLU A&B, COVID) ARPGX2
Influenza A by PCR: NEGATIVE
Influenza B by PCR: NEGATIVE
SARS Coronavirus 2 by RT PCR: NEGATIVE

## 2021-12-28 LAB — ETHANOL: Alcohol, Ethyl (B): <10 mg/dL (ref <10)

## 2021-12-28 LAB — APTT: aPTT: 39 seconds — ABNORMAL HIGH (ref 24–36)

## 2021-12-28 MED ORDER — INSULIN ASPART 100 UNIT/ML IJ SOLN: 0.0000 [IU] | INTRAMUSCULAR | Status: DC

## 2021-12-28 MED ORDER — APIXABAN 5 MG PO TABS
5.0000 mg | ORAL_TABLET | Freq: Two times a day (BID) | ORAL | Status: DC
  Administered 2021-12-28 – 2021-12-29 (×2): 5 mg via ORAL
  Filled 2021-12-28 (×2): qty 1

## 2021-12-28 MED ORDER — TAMSULOSIN HCL 0.4 MG PO CAPS
0.4000 mg | ORAL_CAPSULE | Freq: Every day | ORAL | Status: DC
  Administered 2021-12-29: 0.4 mg via ORAL
  Filled 2021-12-28: qty 1

## 2021-12-28 MED ORDER — SENNOSIDES-DOCUSATE SODIUM 8.6-50 MG PO TABS: 1.0000 | ORAL_TABLET | Freq: Every evening | ORAL | Status: DC | PRN

## 2021-12-28 MED ORDER — ADULT MULTIVITAMIN W/MINERALS CH
1.0000 | ORAL_TABLET | Freq: Every day | ORAL | Status: DC
  Administered 2021-12-29: 1 via ORAL
  Filled 2021-12-28: qty 1

## 2021-12-28 MED ORDER — IOHEXOL 350 MG/ML SOLN
75.0000 mL | Freq: Once | INTRAVENOUS | Status: AC | PRN
  Administered 2021-12-28: 75 mL via INTRAVENOUS

## 2021-12-28 MED ORDER — SODIUM CHLORIDE 0.9 % IV BOLUS
1000.0000 mL | Freq: Once | INTRAVENOUS | Status: AC
  Administered 2021-12-28: 1000 mL via INTRAVENOUS

## 2021-12-28 MED ORDER — PANTOPRAZOLE SODIUM 40 MG PO TBEC
40.0000 mg | DELAYED_RELEASE_TABLET | Freq: Every day | ORAL | Status: DC
  Administered 2021-12-29: 40 mg via ORAL
  Filled 2021-12-28: qty 1

## 2021-12-28 MED ORDER — LACTATED RINGERS IV SOLN: INTRAVENOUS | Status: DC

## 2021-12-28 MED ORDER — AMLODIPINE BESYLATE 5 MG PO TABS
10.0000 mg | ORAL_TABLET | Freq: Every day | ORAL | Status: DC
  Administered 2021-12-29: 10 mg via ORAL
  Filled 2021-12-28: qty 2

## 2021-12-28 MED ORDER — ALBUTEROL SULFATE HFA 108 (90 BASE) MCG/ACT IN AERS: 2.0000 | INHALATION_SPRAY | Freq: Four times a day (QID) | RESPIRATORY_TRACT | Status: DC | PRN

## 2021-12-28 MED ORDER — NITROGLYCERIN 0.4 MG SL SUBL: 0.4000 mg | SUBLINGUAL_TABLET | SUBLINGUAL | Status: DC | PRN

## 2021-12-28 NOTE — Consult Note (Signed)
Code stroke activated at 1448-neuro already had examined for NIH and walked with pt to CT. ? ?Returned at 1518 and neuro said not a candidate for TNK or thrombectomy-off camera at this time. ?

## 2021-12-28 NOTE — ED Notes (Signed)
Blood sent

## 2021-12-28 NOTE — Consult Note (Signed)
Neurology Consultation ?Reason for Consult: Code stroke ?Requesting Physician:  Dannial Monarch ? ?CC: Altered mental status ? ?History is obtained from: Wife, EMS and chart review ? ?HPI: Evan DOLLINS Sr. is a 66 y.o. male with a past medical history significant for atrial fibrillation on Eliquis, hypertension, hyperlipidemia, diabetes, smoking, prior CVA with residual mild left-sided weakness, recent hospitalization for left lower lobe pneumonia. ? ?Wife reports that he was in his normal state of health yesterday.  This morning at breakfast time he needed some extra help with being fed his breakfast which is unusual for him.  Typically at baseline he is able to feed himself, he walks with a walker but occasionally needs assistance from someone else as well to walk, and he typically makes it to the bathroom but is incontinent frequently and then if that she would prefer him wearing pull-ups.  He does he need her assistance with bathing as well.  They had gone for a midmorning nap at 10 AM and when she awoke at 1 PM, she found he was mute and generally weak, for which she called EMS.  She also noted that he had urinated and defecated on himself. ? ? ?LKW: Midnight on 5/13 ?tPA given?: No, Eilquis last dose 7 AM ?IA performed?: No, no LVO ?Premorbid modified rankin scale: 3-4 ?    3 - Moderate disability. Requires some help, but able to walk unassisted. ?    4 - Moderately severe disability. Unable to attend to own bodily needs without assistance, and unable to walk unassisted. ? ? ?ROS: Unable to obtain due to altered mental status.  ? ?Past Medical History:  ?Diagnosis Date  ? Carotid arterial disease ([place])   ? a. 08/2018 Carotid U/S: min-mod RICA atherosclerosis w/o hemodynamically significant stenosis. Nl LICA.  ? Diabetes 1.5, managed as type 2 ([place])   ? Diastolic dysfunction   ? a. 08/2018 Echo: EF 65%. No rwma. Gr1 DD. Mild MR.  ? Diastolic dysfunction   ? a. 08/2019 Echo: EF 55-60%, Gr1 DD. No rwma. Mild MR.  RVSP 37.24mHg.  ? Hypercholesterolemia   ? Hypertension   ? PAF (paroxysmal atrial fibrillation) ([place]   ? a. 10/2019 Event Monitor: PAF; b. CHA2DS2VASc = 5-->Eliquis.  ? Poorly controlled diabetes mellitus ([place]   ? a. 04/2019 A1c 13.8.  ? Recurrent strokes ([place]   ? a. 10/2016 MRI/A: Acute 542mR thalamic infarct, ? subacute infarct of R corona radiata; b. 08/2017 MRI/A: Acute 32m632materal L thalamic infarct. Other more remote lacunar infarcts of thalami bilat. Small vessel dzs; c. 08/2018 MRI/A: Acute lacunar infarct of the post limb of R internal capsule; d. 08/2019 MRI Acute CVA of L paramedian pons adn R cerebellar hemisphere.  ? Tobacco abuse   ? ?Current Outpatient Medications  ?Medication Instructions  ? ACCU-CHEK GUIDE test strip Check fsbs once daily  ? Accu-Chek Softclix Lancets lancets SMARTSIG:Topical  ? albuterol (VENTOLIN HFA) 108 (90 Base) MCG/ACT inhaler 2 puffs, Inhalation, Every 6 hours PRN  ? amLODipine (NORVASC) 10 mg, Oral, Daily  ? apixaban (ELIQUIS) 5 mg, Oral, 2 times daily  ? atorvastatin (LIPITOR) 80 mg, Oral, Daily  ? baclofen (LIORESAL) 10 mg, Oral, 3 times daily  ? Blood Glucose Monitoring Suppl KIT Accucheck Guide with lancets and test strips #100 with one refill.  Test blood sugar twice daily. DX Code E11.65 ; Z79.4  ? Continuous Blood Gluc Sensor (FREESTYLE LIBRE 2 SENSOR) MISC 1 each, Does not apply, Every 14 days  ?  dapagliflozin propanediol (FARXIGA) 10 mg, Oral, Daily before breakfast, In place of glipizide  ? Incontinence Supply Disposable (COMFORT PROTECT ADULT DIAPER/L) MISC 1 Units, Does not apply, Daily, Use 3-4 a day as needed  ? Insulin Pen Needle 32G X 6 MM MISC Daily  ? metoCLOPramide (REGLAN) 5 MG tablet TAKE 1 TABLET BY MOUTH 3 TIMES DAILY BEFORE MEALS  ? Misc. Devices MISC One pair of Compression stockings<BR><BR>Hemiplegia and hemiparesis following cerebral infarction affecting left non-dominant side (HCC)  - Primary<BR>Codes: I69.354<BR>Lower extremity edema <BR>Codes:  R60.0<BR>Type 2 diabetes mellitus with hyperglycemia, with long-term current use of insulin (HCC) <BR>Codes: E11.65, Z79.4  ? nitroGLYCERIN (NITROSTAT) 0.4 mg, Sublingual, Every 5 min PRN  ? omeprazole (PRILOSEC) 40 MG capsule TAKE 1 CAPSULE BY MOUTH IN THE MORNING AND AT BEDTIME  ? tamsulosin (FLOMAX) 0.4 mg, Oral, Daily  ? ? ? ?Family History  ?Problem Relation Age of Onset  ? Hypertension Mother   ? Stroke Mother   ?     died @ age 62  ? Hypertension Father   ? Diabetes Father   ? Heart attack Father   ?     died @ 65  ? ?Social History:  reports that he has quit smoking. His smoking use included cigarettes. He started smoking about 41 years ago. He has a 38.00 pack-year smoking history. He has never used smokeless tobacco. He reports that he does not currently use alcohol after a past usage of about 1.0 standard drink per week. He reports that he does not currently use drugs after having used the following drugs: Cocaine and Marijuana. ? ? ?Exam: ?Current vital signs: ?There were no vitals taken for this visit. ?Vital signs in last 24 hours: ?  ? ? ?Physical Exam  ?Constitutional: Appears chronically ill ?Psych: Affect flat, minimally interactive ?Eyes: No scleral injection ?HENT: No oropharyngeal obstruction.  ?MSK: no joint deformities.  ?Cardiovascular: Bradycardic in the mid 50s ?Respiratory: Effort normal, non-labored breathing ?GI: Soft.  No distension. There is no tenderness.  ?Skin: Warm dry and intact visible skin ? ?Neuro: ?Mental Status: ?Patient is mute, he orients to examiner's voice.  He occasionally follows simple commands with a significant delay. ?Cranial Nerves: ?II: Visual Fields are full to orienting to stimuli. Pupils are equal, round, and reactive to light.   ?III,IV, VI: EOMI to orienting to examiner ?V: Facial sensation is symmetric to eyelash brush ?VII: Facial movement is notable for a left facial droop.  ?VIII: hearing is intact to voice ?XII: tongue is midline without atrophy or  fasciculations.  ?Remainder unable to assess secondary to mental status ?Motor: ?He does not participate in confrontational strength testing.  The right upper extremity he can maintain antigravity for 5 seconds, the left upper extremity drifts quickly to bed.  The right lower extremity he can maintain antigravity for 5 seconds on initial testing, but then on repeat testing he does not maintain it.  The left lower extremity moves weakly 1/5 ?Sensory: ?Grossly equally reactive to noxious stimulation in all 4 extremities ?Deep Tendon Reflexes: ?Hypoactive throughout ?Plantars: ?Toes are downgoing bilaterally, more brisk on the left than the right ?Cerebellar: ?He does perform finger-to-nose testing with his right upper extremity initially, but does not perform heel-to-shin or finger-nose with the left ?Gait:  ?Deferred given acute weakness ? ?NIHSS total 14 ?Score breakdown: 2 points for not answering questions, one-point for not obeying commands, 2 points for left facial droop, 2 points for left arm weakness, 3 points for left leg weakness,  one-point for right lower extremity weakness, 3 points for being mute, 2 points for unintelligible words, ?Performed at time of patient arrival to ED  ? ? ?I have reviewed labs in epic and the results pertinent to this consultation are: ? ?Last metabolic panel ?Lab Results  ?Component Value Date  ? GLUCOSE 104 (H) 12/28/2021  ? NA 145 12/28/2021  ? K 3.6 12/28/2021  ? CL 114 (H) 12/28/2021  ? CO2 26 12/28/2021  ? BUN 30 (H) 12/28/2021  ? CREATININE 1.79 (H) 12/28/2021  ? GFRNONAA 42 (L) 12/28/2021  ? CALCIUM 8.9 12/28/2021  ? PROT 7.3 12/28/2021  ? ALBUMIN 3.3 (L) 12/28/2021  ? LABGLOB 3.2 12/18/2021  ? AGRATIO 1.3 12/18/2021  ? BILITOT 0.3 12/28/2021  ? ALKPHOS 155 (H) 12/28/2021  ? AST 95 (H) 12/28/2021  ? ALT 121 (H) 12/28/2021  ? ANIONGAP 5 12/28/2021  ? ?CBC: ?Recent Labs  ?Lab 12/28/21 ?1449  ?WBC 4.8  ?NEUTROABS 2.6  ?HGB 9.2*  ?HCT 30.6*  ?MCV 90.5  ?PLT 202  ? ?Coagulation  Studies: ?Recent Labs  ?  12/28/21 ?1449  ?LABPROT 18.4*  ?INR 1.5*  ?  ? ? ?I have reviewed the images obtained: ? ?Head CT without acute intracranial process ?CTA without large vessel occlusion, therefore p

## 2021-12-28 NOTE — ED Notes (Signed)
Neuro at bedside. Cleared for CT. Taken to CT on [place] with primary RN, Efrain.  ?

## 2021-12-28 NOTE — ED Provider Notes (Signed)
? ?[facility] ?Provider Note ? ? ? Event Date/Time  ? First MD Initiated Contact with Patient 12/28/21 1511   ?  (approximate) ? ? ?History  ? ?Code Stroke (LNK normal 1330, pt took nap approx 10 AM, woke up and pt had urinated and defecated on self, left side facial droop, pt normally A&O X4, pt condition getting worse with EMS ) ? ? ?HPI ? ?Evan KRIESEL Sr. is a 66 y.o. male with extensive past medical history including A-fib on Eliquis, hypertension, anemia, diabetes, prior CVA, acute blood loss status.  History is provided primarily by the patient's wife as well as EMS.  Arrives as code stroke.  Per report, this morning, when visiting therapist, he seemed to need extra help and was more sleepy than usual.  He also seemed to be slightly more unsteady on his feet.  Only uses a walker requiring open usual.  She nap, and when she woke up around 1 PM, he was found essentially mute and generally weak.  EMS was called.  Patient has been minimally responsive in route.  He had urinated and defecated on himself. No falls. Remainder of history limited due to altered mental status. ?  ? ? ?Physical Exam  ? ?Triage Vital Signs: ?ED Triage Vitals  ?Enc Vitals Group  ?   BP   ?   Pulse   ?   Resp   ?   Temp   ?   Temp src   ?   SpO2   ?   Weight   ?   Height   ?   Head Circumference   ?   Peak Flow   ?   Pain Score   ?   Pain Loc   ?   Pain Edu?   ?   Excl. in GC?   ? ? ?Most recent vital signs: ?Vitals:  ? 12/28/21 1800 12/28/21 1815  ?BP: 140/78 129/72  ?Pulse: (!) 43 (!) 40  ?Resp: (!) 27 11  ?Temp:    ?SpO2: 100% 100%  ? ? ? ?General: Drowsy, does respond to painful stimuli.  No distress. ?CV:  Good peripheral perfusion.  No murmurs. ?Resp:  Normal effort.  Lungs clear bilaterally. ?Abd:  No distention.  No tenderness noted. ?Other:  Occasionally follows simple commands with significant delay.  Falls asleep easily.  Left-sided facial droop noted.  Significant left upper and lower extremity  weakness noted.  Unable to assess gait.  Unable to assess coordination. ? ? ?ED Results / Procedures / Treatments  ? ?Labs ?(all labs ordered are listed, but only abnormal results are displayed) ?Labs Reviewed  ?PROTIME-INR - Abnormal; Notable for the following components:  ?    Result Value  ? Prothrombin Time 18.4 (*)   ? INR 1.5 (*)   ? All other components within normal limits  ?APTT - Abnormal; Notable for the following components:  ? aPTT 39 (*)   ? All other components within normal limits  ?CBC WITH DIFFERENTIAL/PLATELET - Abnormal; Notable for the following components:  ? RBC 3.38 (*)   ? Hemoglobin 9.2 (*)   ? HCT 30.6 (*)   ? RDW 17.2 (*)   ? nRBC 0.4 (*)   ? All other components within normal limits  ?COMPREHENSIVE METABOLIC PANEL - Abnormal; Notable for the following components:  ? Chloride 114 (*)   ? Glucose, Bld 104 (*)   ? BUN 30 (*)   ? Creatinine, Ser 1.79 (*)   ?  Albumin 3.3 (*)   ? AST 95 (*)   ? ALT 121 (*)   ? Alkaline Phosphatase 155 (*)   ? GFR, Estimated 42 (*)   ? All other components within normal limits  ?BLOOD GAS, VENOUS - Abnormal; Notable for the following components:  ? pO2, Ven 67 (*)   ? Bicarbonate 29.5 (*)   ? Acid-Base Excess 3.1 (*)   ? All other components within normal limits  ?RESP PANEL BY RT-PCR (FLU A&B, COVID) ARPGX2  ?ETHANOL  ?AMMONIA  ?CBC  ?URINE DRUG SCREEN, QUALITATIVE ([facility] ONLY)  ?URINALYSIS, ROUTINE W REFLEX MICROSCOPIC  ?TSH  ?CBG MONITORING, ED  ? ? ? ?EKG ?Normal sinus rhythm, ventricular rate 56.  PR 438, QRS 177, QTc 463.  No acute ST elevations or depressions. ? ? ? ? ?RADIOLOGY ?CT head: No acute abnormality ?CT angio head/neck: No LVO ?Chest x-ray: Clear ? ? ?I also independently reviewed and agree with radiologist interpretations. ? ? ?PROCEDURES: ? ?Critical Care performed: Yes, see critical care procedure note(s) ? ?.Critical Care ?Performed by: Shaune Pollack, MD ?Authorized by: Shaune Pollack, MD  ? ?Critical care provider statement:  ?  Critical  care time (minutes):  30 ?  Critical care time was exclusive of:  Separately billable procedures and treating other patients ?  Critical care was necessary to treat or prevent imminent or life-threatening deterioration of the following conditions:  Cardiac failure, circulatory failure and CNS failure or compromise ?  Critical care was time spent personally by me on the following activities:  Development of treatment plan with patient or surrogate, discussions with consultants, evaluation of patient's response to treatment, examination of patient, ordering and review of laboratory studies, ordering and review of radiographic studies, ordering and performing treatments and interventions, pulse oximetry, re-evaluation of patient's condition and review of old charts ? ? ? ?MEDICATIONS ORDERED IN ED: ?Medications  ?iohexol (OMNIPAQUE) 350 MG/ML injection 75 mL (75 mLs Intravenous Contrast Given 12/28/21 1514)  ?sodium chloride 0.9 % bolus 1,000 mL (1,000 mLs Intravenous New Bag/Given 12/28/21 1641)  ? ? ? ?IMPRESSION / MDM / ASSESSMENT AND PLAN / ED COURSE  ?I reviewed the triage vital signs and the nursing notes. ?             ?               ? ? ?The patient is on the cardiac monitor to evaluate for evidence of arrhythmia and/or significant heart rate changes. ? ? ?Ddx:  ?Differential includes the following, with pertinent life- or limb-threatening emergencies considered: ? ?CVA, TIA, acute encephalopathy in the setting of UTI or occult infection, polypharmacy, including possible baclofen toxicity AKI/dehydration, hypercapnia, partial complex seizure ? ? ?MDM:  ?66 year old male with extensive past medical history as above including history of A-fib here with altered mental status.  Patient presents more so as acute encephalopathy although his weakness does seem significantly more than reported to be baseline according to his wife.  He was activated as code stroke on arrival, taken to CT scanner.  Discussed with  neurology.  They suspect this could be stroke versus metabolic encephalopathy.  Lab work shows moderate AKI with elevated BUN and creatinine from baseline.  He is on baclofen.  This could explain some of his encephalopathy.  Patient also has fairly chronic the mildly worsened chronic transaminitis.  Bilirubin is 0.3.  Abdomen soft, will check ultrasound.  CBC without leukocytosis.  No other septic clinically.  COVID is negative.  Blood  gas without CO2 retention.  Ammonia is normal.  Will admit for altered mental status, MRI, further work-up as well as hydration and monitoring.  Family updated and in agreement at bedside. ? ? ? ? ?MEDICATIONS GIVEN IN ED: ?Medications  ?iohexol (OMNIPAQUE) 350 MG/ML injection 75 mL (75 mLs Intravenous Contrast Given 12/28/21 1514)  ?sodium chloride 0.9 % bolus 1,000 mL (1,000 mLs Intravenous New Bag/Given 12/28/21 1641)  ? ? ? ?Consults:  ?Neurology Dr. Iver NestleBhagat, discussed case and plan ?Hospitalist consulted for admission ? ? ?EMR reviewed  ?Reviewed neurology notes, reviewed recent admission for sepsis in April 2023 with PNA ? ? ? ?FINAL CLINICAL IMPRESSION(S) / ED DIAGNOSES  ? ?Final diagnoses:  ?Acute encephalopathy  ?AKI (acute kidney injury) (HCC)  ?Stroke-like episode  ? ? ? ?Rx / DC Orders  ? ?ED Discharge Orders   ? ? None  ? ?  ? ? ? ?Note:  This document was prepared using Dragon voice recognition software and may include unintentional dictation errors. ?  ?Shaune PollackIsaacs, Vartan Kerins, MD ?12/28/21 1844 ? ?

## 2021-12-28 NOTE — Progress Notes (Signed)
Patient ID: Evan CHRISTOFFERSEN Sr., male   DOB: 08/11/1956, 66 y.o.   MRN: [id number] ? ? ?MRI brain reviewed, radiology read pending, no evidence of acute stroke ?No need for permissive hypertension. Goal normotension  ? ?Brooke Dare MD-PhD ?[facility] ?[phone]  ? ?No charge note ?

## 2021-12-28 NOTE — H&P (Signed)
?History and Physical  ? ? ?ZACORY FIOLA Sr. RCV:[id number] DOB: 1956/04/28 DOA: 12/28/2021 ? ?PCP: Charlynne Cousins, MD  ?Patient coming from: home ? ? ?Chief Complaint: Altered Mental status ? ?HPI: KEVAUGHN EWING Sr. is a 66 y.o. male with medical history significant of recurrent strokes with residual mild left-sided weakness, paroxysmal atrial fibrillation on a/c, type 2 diabetes mellitus, peripheral vascular disease, tobacco abuse presents with altered mental status.  Patient's wife at bedside reports this morning patient was acting funny during breakfast. She tried feeding him, normally he feeds himself.  He could not stand up was about to fall.  He looked weak and staring at wife and could not talk.  At that point she checked his blood glucose which was 68 and gave him regular [facility].  Wife does report he has not been hydrating as much.  She actually had to feed him today and ate very little.  Denies any diarrhea, shortness of breath, any other neurologic symptoms or chest pain. ?Typically at baseline he is able to feed himself, he walks with a walker but occasionally needs assistant, and typically makes it to the bathroom and is incontinent and wears a pull-up. ?When the above happened, wife called EMS. She noted he had urinated and defecated on himself. ? ?Please note patient is mildly lethargic, does open his eyes but does not conversate with me.  Appears weak. ? ?ED Course: Blood pressure 144/83, heart rate 44-62 afebrile, respiratory rate 20, satting 97% room air. ?CODE STROKE was activated. Neurology was consulted.  CT of the head no evidence of acute intracranial abnormality.  MRI without any acute intracranial abnormality. ?Pertinent labs: Chloride 114, glucose 104, BUN 30, creatinine 1.79 baseline 1.27.  AST 95, ALT 121, ammonia 34, hemoglobin 9.2, hematocrit 30.6, platelets 202, INR 1.5, PT 18.4 ?EKG personally reviewed sinus bradycardia, right bundle branch, prolonged PR interval ? ? ?Review of  Systems: All systems reviewed and otherwise negative.  ? ? ?Past Medical History:  ?Diagnosis Date  ? Carotid arterial disease ([place])   ? a. 08/2018 Carotid U/S: min-mod RICA atherosclerosis w/o hemodynamically significant stenosis. Nl LICA.  ? Diabetes 1.5, managed as type 2 ([place])   ? Diastolic dysfunction   ? a. 08/2018 Echo: EF 65%. No rwma. Gr1 DD. Mild MR.  ? Diastolic dysfunction   ? a. 08/2019 Echo: EF 55-60%, Gr1 DD. No rwma. Mild MR. RVSP 37.55mmHg.  ? Hypercholesterolemia   ? Hypertension   ? PAF (paroxysmal atrial fibrillation) ([place])   ? a. 10/2019 Event Monitor: PAF; b. CHA2DS2VASc = 5-->Eliquis.  ? Poorly controlled diabetes mellitus ([place])   ? a. 04/2019 A1c 13.8.  ? Recurrent strokes ([place])   ? a. 10/2016 MRI/A: Acute 11mm R thalamic infarct, ? subacute infarct of R corona radiata; b. 08/2017 MRI/A: Acute 65mm lateral L thalamic infarct. Other more remote lacunar infarcts of thalami bilat. Small vessel dzs; c. 08/2018 MRI/A: Acute lacunar infarct of the post limb of R internal capsule; d. 08/2019 MRI Acute CVA of L paramedian pons adn R cerebellar hemisphere.  ? Tobacco abuse   ? ? ?Past Surgical History:  ?Procedure Laterality Date  ? ESOPHAGOGASTRODUODENOSCOPY (EGD) WITH PROPOFOL N/A 04/26/2019  ? Procedure: ESOPHAGOGASTRODUODENOSCOPY (EGD) WITH PROPOFOL;  Surgeon: Lin Landsman, MD;  Location: [facility] ENDOSCOPY;  Service: Gastroenterology;  Laterality: N/A;  ? NO PAST SURGERIES    ? ? ? reports that he has quit smoking. His smoking use included cigarettes. He started smoking about 41 years ago.  He has a 38.00 pack-year smoking history. He has never used smokeless tobacco. He reports that he does not currently use alcohol after a past usage of about 1.0 standard drink per week. He reports that he does not currently use drugs after having used the following drugs: Cocaine and Marijuana. ? ?No Known Allergies ? ?Family History  ?Problem Relation Age of Onset  ? Hypertension Mother   ? Stroke Mother   ?     died @  age 20  ? Hypertension Father   ? Diabetes Father   ? Heart attack Father   ?     died @ 27  ? ? ? ?Prior to Admission medications   ?Medication Sig Start Date End Date Taking? Authorizing Provider  ?ACCU-CHEK GUIDE test strip Check fsbs once daily 12/24/20   Steele Sizer, MD  ?Accu-Chek Softclix Lancets lancets SMARTSIG:Topical 12/24/20   Steele Sizer, MD  ?albuterol (VENTOLIN HFA) 108 (90 Base) MCG/ACT inhaler Inhale 2 puffs into the lungs every 6 (six) hours as needed for wheezing or shortness of breath. 02/26/21   Vigg, Avanti, MD  ?amLODipine (NORVASC) 10 MG tablet Take 1 tablet (10 mg total) by mouth daily. 06/27/21   Vigg, Avanti, MD  ?apixaban (ELIQUIS) 5 MG TABS tablet Take 1 tablet (5 mg total) by mouth 2 (two) times daily. 10/18/21   Loletha Grayer, MD  ?atorvastatin (LIPITOR) 80 MG tablet Take 1 tablet (80 mg total) by mouth daily. ?Patient taking differently: Take 80 mg by mouth every evening. 03/20/21   Vigg, Avanti, MD  ?baclofen (LIORESAL) 10 MG tablet Take 1 tablet (10 mg total) by mouth 3 (three) times daily. 06/20/21   Vigg, Avanti, MD  ?Blood Glucose Monitoring Suppl KIT Accucheck Guide with lancets and test strips #100 with one refill.  Test blood sugar twice daily. DX Code E11.65 ; Z79.4 07/09/20   Towanda Malkin, MD  ?Continuous Blood Gluc Sensor (FREESTYLE LIBRE 2 SENSOR) MISC 1 each by Does not apply route every 14 (fourteen) days. 06/20/21   Charlynne Cousins, MD  ?dapagliflozin propanediol (FARXIGA) 10 MG TABS tablet Take 1 tablet (10 mg total) by mouth daily before breakfast. In place of glipizide 10/18/21   Loletha Grayer, MD  ?Incontinence Supply Disposable (COMFORT PROTECT ADULT DIAPER/L) MISC 1 Units by Does not apply route daily. Use 3-4 a day as needed 06/20/21   Charlynne Cousins, MD  ?Insulin Pen Needle 32G X 6 MM MISC Daily 10/24/20   Steele Sizer, MD  ?metoCLOPramide (REGLAN) 5 MG tablet TAKE 1 TABLET BY MOUTH 3 TIMES DAILY BEFORE MEALS 12/25/21   Vigg, Avanti, MD  ?Misc. Devices  MISC One pair of Compression stockings ? ?Hemiplegia and hemiparesis following cerebral infarction affecting left non-dominant side (HCC)  - Primary ?Codes: W54.627 ?Lower extremity edema  ?Codes: R60.0 ?Type 2 diabetes mellitus with hyperglycemia, with long-term current use of insulin ([place])  ?Codes: E11.65, Z79.4 May 25, 2020   Towanda Malkin, MD  ?nitroGLYCERIN (NITROSTAT) 0.4 MG SL tablet Place 1 tablet (0.4 mg total) under the tongue every 5 (five) minutes as needed for chest pain. 09/16/21   Emeterio Reeve, DO  ?omeprazole (PRILOSEC) 40 MG capsule TAKE 1 CAPSULE BY MOUTH IN THE MORNING AND AT BEDTIME ?Patient taking differently: Take 40 mg by mouth in the morning and at bedtime. 08/05/21   Vigg, Avanti, MD  ?tamsulosin (FLOMAX) 0.4 MG CAPS capsule Take 1 capsule (0.4 mg total) by mouth daily. 10/18/21   Loletha Grayer, MD  ? ? ?Physical Exam: ?  Vitals:  ? 12/28/21 1730 12/28/21 1745 12/28/21 1800 12/28/21 1815  ?BP: (!) 149/75 139/76 140/78 129/72  ?Pulse: (!) 45 (!) 43 (!) 43 (!) 40  ?Resp: _0 ?Temp:      ?TempSrc:      ?SpO2: 99% 99% 100% 100%  ?Weight:      ?Height:      ? ? ?Constitutional: NAD, calm, comfortable ?Vitals:  ? 12/28/21 1730 12/28/21 1745 12/28/21 1800 12/28/21 1815  ?BP: (!) 149/75 139/76 140/78 129/72  ?Pulse: (!) 45 (!) 43 (!) 43 (!) 40  ?Resp: _1 ?Temp:      ?TempSrc:      ?SpO2: 99% 99% 100% 100%  ?Weight:      ?Height:      ?GEN: appears weak, drawsy ?Eyes: pinpoint pupils. Follows my finger with delay.  ?ENMT: DMM  ?Neck: normal, supple ?Respiratory: poor respiratory effort, no w/r/r  ?Cardiovascular: Regular bradycardic , trace edema ?Abdomen: no tenderness, no masses palpated. No hepatosplenomegaly. Bowel sounds positive.  ?Musculoskeletal: no clubbing / cyanosis. Lt .  ?Skin: warm , dry, no rash. ?Neurologic: opens eyes, mute. LUE 1/5, RUE 4/5, LE x2 4/5.did not follow my other commands, would close eyes. ?Psychiatric: Normal judgment and insight.  ? ? ?Labs  on Admission: I have personally reviewed following labs and imaging studies ? ?CBC: ?Recent Labs  ?Lab 12/28/21 ?1449  ?WBC 4.8  ?NEUTROABS 2.6  ?HGB 9.2*  ?HCT 30.6*  ?MCV 90.5  ?PLT 202  ? ?Basic

## 2021-12-29 ENCOUNTER — Other Ambulatory Visit: Payer: Self-pay

## 2021-12-29 ENCOUNTER — Telehealth: Payer: Self-pay | Admitting: Internal Medicine

## 2021-12-29 DIAGNOSIS — R404 Transient alteration of awareness: Secondary | ICD-10-CM | POA: Diagnosis not present

## 2021-12-29 DIAGNOSIS — R197 Diarrhea, unspecified: Secondary | ICD-10-CM

## 2021-12-29 DIAGNOSIS — I482 Chronic atrial fibrillation, unspecified: Secondary | ICD-10-CM | POA: Diagnosis not present

## 2021-12-29 DIAGNOSIS — R001 Bradycardia, unspecified: Secondary | ICD-10-CM | POA: Diagnosis not present

## 2021-12-29 DIAGNOSIS — N179 Acute kidney failure, unspecified: Secondary | ICD-10-CM | POA: Diagnosis not present

## 2021-12-29 DIAGNOSIS — N189 Chronic kidney disease, unspecified: Secondary | ICD-10-CM | POA: Diagnosis not present

## 2021-12-29 LAB — BASIC METABOLIC PANEL
Anion gap: 6 (ref 5–15)
BUN: 25 mg/dL — ABNORMAL HIGH (ref 8–23)
CO2: 25 mmol/L (ref 22–32)
Calcium: 9 mg/dL (ref 8.9–10.3)
Chloride: 113 mmol/L — ABNORMAL HIGH (ref 98–111)
Creatinine, Ser: 1.4 mg/dL — ABNORMAL HIGH (ref 0.61–1.24)
GFR, Estimated: 56 mL/min — ABNORMAL LOW (ref >60)
Glucose, Bld: 91 mg/dL (ref 70–99)
Potassium: 3.7 mmol/L (ref 3.5–5.1)
Sodium: 144 mmol/L (ref 135–145)

## 2021-12-29 LAB — CBG MONITORING, ED
Glucose-Capillary: 108 mg/dL — ABNORMAL HIGH (ref 70–99)
Glucose-Capillary: 78 mg/dL (ref 70–99)

## 2021-12-29 MED ORDER — HYDRALAZINE HCL 50 MG PO TABS
25.0000 mg | ORAL_TABLET | Freq: Three times a day (TID) | ORAL | Status: DC
Start: 2021-12-29 — End: 2021-12-29
  Administered 2021-12-29: 25 mg via ORAL
  Filled 2021-12-29: qty 1

## 2021-12-29 NOTE — ED Notes (Signed)
Pt in stable condition, IV removed, pt given discharge instructions, pt left with spouse. ?

## 2021-12-29 NOTE — Progress Notes (Signed)
[facility] responded to code stroke page. Patient was alert upon arrival. Appeared disoriented throughout brief encounter. No spiritual care support was desired by patient.  ? ?Rev. Elizebeth Brooking, M.Div. ?Healthcare Chaplain ?

## 2021-12-29 NOTE — Evaluation (Signed)
Physical Therapy Evaluation ?Patient Details ?Name: Evan PARAS Sr. ?MRN: [id number] ?DOB: [birth year] ?Today's Date: 12/29/2021 ? ?History of Present Illness ? 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 MRI of brain without acute process. AMS thought to be likely due to buildup of baclofen in the setting of acute on chronic kidney disease  ?Clinical Impression ? Patient is agreeable to PT evaluation and eager to be discharged home. He has residual left side weakness from prior stroke and ambulates with lofstrand crutches at baseline. He lives with his spouse.  ?The patient required increased time with all activity, but no physical assistance needed for bed mobility or transfers. He ambulated with minimal assistance (for management of LUE and left side of rolling walker) in hallway with occasional cues for safety. Decreased foot clearance noted with increased gait distance, due to fatigue and residual left leg weakness. He reports he feels close to his baseline. Recommend PT follow up to maximize independence and facilitate return to prior level of function. Recommend HHPT follow up at discharge.  ?   ? ?Recommendations for follow up therapy are one component of a multi-disciplinary discharge planning process, led by the attending physician.  Recommendations may be updated based on patient status, additional functional criteria and insurance authorization. ? ?Follow Up Recommendations Home health PT ? ?  ?Assistance Recommended at Discharge Intermittent Supervision/Assistance  ?Patient can return home with the following ? A little help with walking and/or transfers;A little help with bathing/dressing/bathroom;Help with stairs or ramp for entrance;Assist for transportation ? ?  ?Equipment Recommendations None recommended by PT  ?Recommendations for  Other Services ?    ?  ?Functional Status Assessment Patient has had a recent decline in their functional status and demonstrates the ability to make significant improvements in function in a reasonable and predictable amount of time.  ? ?  ?Precautions / Restrictions Precautions ?Precautions: Fall ?Restrictions ?Weight Bearing Restrictions: No  ? ?  ? ?Mobility ? Bed Mobility ?Overal bed mobility: Needs Assistance ?Bed Mobility: Supine to Sit, Sit to Supine ?  ?  ?Supine to sit: Supervision ?Sit to supine: Min guard ?  ?General bed mobility comments: cues for task initiation. increased time required to complete tasks ?  ? ?Transfers ?Overall transfer level: Needs assistance ?  ?Transfers: Sit to/from Stand ?Sit to Stand: Supervision ?  ?  ?  ?  ?  ?General transfer comment: supervision for safety. good safety awareness demonstrated with occasional cues for safety ?  ? ?Ambulation/Gait ?Ambulation/Gait assistance: Min assist, +2 safety/equipment (Min A for physical assistance with second person needed for line management of IV and telemetry) ?Gait Distance (Feet): 70 Feet ?Assistive device: Rolling walker (2 wheels) ?Gait Pattern/deviations: Step-to pattern, Decreased stride length, Decreased stance time - left, Decreased dorsiflexion - left ?Gait velocity: decreased ?  ?  ?General Gait Details: due to chronic left side weakness, assistance provided for support of LUE (loftstrand crutches not available during eval) and left side of rolling walker. patient ambulated into hallway with no loss of balance. he would likely require less assistance using his own assistive devices. decreased foot clearance on left foot noted with increased gait distance due to fatigue and chronic left leg weakness. educated patient on this for fall prevention in the future. ? ?Stairs ?  ?  ?  ?  ?  ? ?Wheelchair  Mobility ?  ? ?Modified Rankin (Stroke Patients Only) ?  ? ?  ? ?Balance Overall balance assessment: Needs  assistance ?Sitting-balance support: Feet supported ?Sitting balance-Leahy Scale: Good ?  ?  ?Standing balance support: Bilateral upper extremity supported, Reliant on assistive device for balance ?Standing balance-Leahy Scale: Fair ?Standing balance comment: with UE support. no loss of balance in standing ?  ?  ?  ?  ?  ?  ?  ?  ?  ?  ?  ?   ? ? ? ?Pertinent Vitals/Pain Pain Assessment ?Pain Assessment: No/denies pain  ? ? ?Home Living Family/patient expects to be discharged to:: Private residence ?Living Arrangements: Spouse/significant other ?Available Help at Discharge: Family ?Type of Home: House ?Home Access: Stairs to enter ?  ?  ?  ?Home Layout: One level ?Home Equipment: Shower seat (lofstrand crutches) ?   ?  ?Prior Function Prior Level of Function : Independent/Modified Independent ?  ?  ?  ?  ?  ?  ?Mobility Comments: Mod I with lofstrand crutches ?ADLs Comments: Mod I, uses shower chair ?  ? ? ?Hand Dominance  ? Dominant Hand: Right ? ?  ?Extremity/Trunk Assessment  ? Upper Extremity Assessment ?Upper Extremity Assessment:  (chronic LUE deficits noted, hypertonicity. see OT note for details) ?  ? ?Lower Extremity Assessment ?Lower Extremity Assessment: RLE deficits/detail;LLE deficits/detail ?RLE Deficits / Details: dorsiflexion, plantarflexion, knee extension 5/5 ?RLE Sensation: WNL ?LLE Deficits / Details: dorsiflexion 1/5, plantarflexion 2/5. chronic weakness from residual stroke deficits ?LLE Sensation: WNL ?LLE Coordination: decreased gross motor ?  ? ?   ?Communication  ? Communication: Expressive difficulties (difficult to understand at times)  ?Cognition Arousal/Alertness: Awake/alert ?[facility] tasks assessed/performed ?Overall Cognitive Status: Within Functional Limits for tasks assessed ?  ?  ?  ?  ?  ?  ?  ?  ?  ?  ?  ?  ?  ?  ?  ?  ?General Comments: patient is able to follow all commands with increased time ?  ?  ? ?  ?General Comments   ? ?  ?Exercises     ? ?Assessment/Plan  ?  ?PT Assessment Patient needs continued PT services  ?PT Problem List Decreased strength;Decreased range of motion;Decreased activity tolerance;Decreased balance;Decreased mobility ? ?   ?  ?PT Treatment Interventions Gait training;DME instruction;Stair training;Functional mobility training;Therapeutic exercise;Therapeutic activities;Balance training;Neuromuscular re-education;Patient/family education   ? ?PT Goals (Current goals can be found in the Care Plan section)  ?Acute Rehab PT Goals ?Patient Stated Goal: to go home today ?PT Goal Formulation: With patient ?Time For Goal Achievement: 01/12/22 ?Potential to Achieve Goals: Good ? ?  ?Frequency Min 2X/week ?  ? ? ?Co-evaluation PT/OT/SLP Co-Evaluation/Treatment: Yes ?Reason for Co-Treatment: For patient/therapist safety (partial co-treatment) ?PT goals addressed during session: Mobility/safety with mobility ?OT goals addressed during session: ADL's and self-care ?  ? ? ?  ?AM-PAC PT "6 Clicks" Mobility  ?Outcome Measure Help needed turning from your back to your side while in a flat bed without using bedrails?: A Little ?Help needed moving from lying on your back to sitting on the side of a flat bed without using bedrails?: A Little ?Help needed moving to and from a bed to a chair (including a wheelchair)?: A Little ?Help needed standing up from a chair using your arms (e.g., wheelchair or bedside chair)?: A Little ?Help needed to walk in hospital room?: A Little ?Help needed climbing 3-5 steps with a railing? : A Little ?  6 Click Score: 18 ? ?  ?End of Session   ?Activity Tolerance: Patient tolerated treatment well ?Patient left: in bed;with call bell/phone within reach (OT in the room) ?Nurse Communication: Mobility status ?PT Visit Diagnosis: Unsteadiness on feet (R26.81);Muscle weakness (generalized) (M62.81) ?  ? ?Time: 0177-9390 ?PT Time Calculation (min) (ACUTE ONLY): 18 min ? ? ?Charges:   PT Evaluation ?$PT Eval Low Complexity: 1  Low ?  ?  ?   ? ?Donna Bernard, PT, MPT ? ? ?Evan Townsend ?12/29/2021, 11:38 AM ? ?

## 2021-12-29 NOTE — ED Notes (Signed)
Pt brief soiled with diarrhea. Peri-care performed on pt.  ?

## 2021-12-29 NOTE — ED Notes (Signed)
Pt placed on a bedpan, had a bm, moderate yellow semi solid feces noted, pt cleaned and assisted to a position of comfort. ?

## 2021-12-29 NOTE — Telephone Encounter (Signed)
Called Evan Townsend back from the Ultrasound Dept at [facility], informed her that she could cancel the appt patient has on Wednesday since same [place] was preformed today in ED ?

## 2021-12-29 NOTE — Telephone Encounter (Signed)
Copied from CRM [id number]. Topic: General - Other ?>> Dec 29, 2021 10:39 AM Marylen Ponto wrote: ?Reason for CRM: Kim with Ultrasound Dept at [facility] stated patient was at ED and the ultrasound was completed. Kim asked if she could cancel the appt for ultrasound that is scheduled. Cb# [phone] ?

## 2021-12-29 NOTE — TOC Initial Note (Signed)
Transition of Care (TOC) - Initial/Assessment Note  ? ? ?Patient Details  ?Name: Evan STAIB Sr. ?MRN: [id number] ?Date of Birth: [birth year] ? ?Transition of Care (TOC) CM/SW Contact:    ?Shelbie Hutching, RN ?Phone Number: ?12/29/2021, 3:10 PM ? ?Clinical Narrative:                 ?Patient placed under observation for altered mental status, he is medically cleared for discharge home today with home health services.  Tommi Rumps with [facility] has accepted home health referral for PT and OT.   ?Wife has picked patient up and transporting home.  ? ?Expected Discharge Plan: [place] ?Barriers to Discharge: Barriers Resolved ? ? ?Patient Goals and CMS Choice ?Patient states their goals for this hospitalization and ongoing recovery are:: to get home ?CMS Medicare.gov Compare Post Acute Care list provided to:: Patient ?Choice offered to / list presented to : Patient ? ?Expected Discharge Plan and Services ?Expected Discharge Plan: [place] ?  ?Discharge Planning Services: CM Consult ?Post Acute Care Choice: Home Health ?Living arrangements for the past 2 months: [place] ?Expected Discharge Date: 12/29/21               ?DME Arranged: N/A ?DME Agency: NA ?  ?  ?  ?HH Arranged: PT, OT ?[place] Agency: [place] ?Date HH Agency Contacted: 12/29/21 ?Time [place]: 1509 ?Representative spoke with at [place]: Tommi Rumps ? ?Prior Living Arrangements/Services ?Living arrangements for the past 2 months: [place] ?Lives with:: Spouse ?Patient language and need for interpreter reviewed:: Yes ?Do you feel safe going back to the place where you live?: Yes      ?Need for Family Participation in Patient Care: Yes (Comment) ?Care giver support system in place?: Yes (comment) (wife) ?Current home services: DME ?Criminal Activity/Legal Involvement Pertinent to Current Situation/Hospitalization: No - Comment as needed ? ?Activities of Daily Living ?Home Assistive  Devices/Equipment: Gilford Rile (specify type), Shower chair without back ?ADL Screening (condition at time of admission) ?Patient's cognitive ability adequate to safely complete daily activities?: Yes ?Is the patient deaf or have difficulty hearing?: No ?Does the patient have difficulty seeing, even when wearing glasses/contacts?: No ?Does the patient have difficulty concentrating, remembering, or making decisions?: No ?Patient able to express need for assistance with ADLs?: Yes ?Does the patient have difficulty dressing or bathing?: Yes ?Independently performs ADLs?: No ?Communication: Independent ?Dressing (OT): Independent ?Grooming: Independent ?Feeding: Independent ?Bathing: Needs assistance ?Is this a change from baseline?: Pre-admission baseline ?Toileting: Needs assistance ?Is this a change from baseline?: Pre-admission baseline ?In/Out Bed: Independent with device (comment) ?Walks in Home: Independent with device (comment) ?Does the patient have difficulty walking or climbing stairs?: Yes ?Weakness of Legs: None ?Weakness of Arms/Hands: None ? ?Permission Sought/Granted ?Permission sought to share information with : Case Manager, Family Supports, Other (comment) ?Permission granted to share information with : Yes, Verbal Permission Granted ? Share Information with NAME: Dyllen Bryner ? Permission granted to share info w AGENCY: home health ? Permission granted to share info w Relationship: wife ? Permission granted to share info w Contact Information: [phone] ? ?Emotional Assessment ?  ?  ?  ?Orientation: : Oriented to Self, Oriented to Place, Oriented to  Time, Oriented to Situation ?Alcohol / Substance Use: Not Applicable ?Psych Involvement: No (comment) ? ?Admission diagnosis:  Altered mental status [R41.82] ?Patient Active Problem List  ? Diagnosis Date Noted  ? Altered mental status 12/28/2021  ?  Elevated liver enzymes 12/28/2021  ? Pneumonia 11/26/2021  ? Sepsis due to pneumonia ([place]) 11/26/2021  ?  CAP (community acquired pneumonia) 11/26/2021  ? Dizziness 11/26/2021  ? AKI (acute kidney injury) ([place]) 11/26/2021  ? Sinus pause   ? Bradycardia 10/16/2021  ? Diarrhea 10/16/2021  ? Hypertensive emergency without congestive heart failure 09/14/2021  ? Hypertension   ? Unstable angina (HCC)   ? Diabetes mellitus without complication ([place]) [id number]  ? Primary hypertension 06/27/2021  ? Diabetic ulcer of toe of left foot associated with type 2 diabetes mellitus, limited to breakdown of skin ([place]) 06/27/2021  ? Need for pneumococcal vaccination 06/20/2021  ? Cerebrovascular accident (CVA) ([place]) 06/05/2021  ? Need for influenza vaccination 06/02/2021  ? Constipation 06/02/2021  ? Insomnia 06/02/2021  ? Gastroparesis 02/26/2021  ? 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 Muscle spasm of left lower extremity 02/26/2021  ? History of stroke 02/26/2021  ? Type 2 diabetes mellitus with hyperglycemia ([place]) 10/29/2020  ? Diastolic dysfunction   ? History of diabetes with ketoacidosis 09/20/2020  ? CKD (chronic kidney disease) stage 2, GFR 60-89 ml/min 06/27/2020  ? Monitoring for anticoagulant use 06/27/2020  ? Lower extremity edema 05/16/2020  ? Atrial fibrillation, chronic ([place]) 10/26/2019  ? Anemia of chronic disease 09/05/2019  ? Acute kidney injury superimposed on CKD ([place]) 09/05/2019  ? Coagulopathy ([place]) 06/12/2019  ? Gastroesophageal reflux disease   ? Hemiplegia and hemiparesis following cerebral infarction affecting left non-dominant side ([place]) 11/25/2018  ? RBBB 10/15/2018  ? Dysphagia, post-stroke   ? Microalbuminuria 07/29/2018  ? Hyperlipidemia LDL goal <70 02/11/2017  ? Recurrent strokes ([place]) 11/10/2016  ? Essential hypertension 11/10/2016  ? Uncontrolled type 2 diabetes mellitus with hyperglycemia, with long-term current use of insulin ([place]) 11/10/2016  ? Tobacco abuse 08/28/2013  ? Peripheral neuropathy 08/28/2013  ? ?PCP:  Charlynne Cousins, MD ?Pharmacy:   ?[facility] [address] ?Phone: [phone] Fax: [phone] ? ?[facility], [place] [address] [place] ?[place] ?[place] [address] ?Phone: [phone] Fax: [phone] ? ?[place] ([address]), [place] ?[address] [address] ?Phone: [phone] Fax: [phone] ? ? ? ? ?Social Determinants of Health (SDOH) Interventions ?  ? ?Readmission Risk Interventions ?   ? View : No data to display.  ?  ?  ?  ? ? ? ?

## 2021-12-29 NOTE — Discharge Summary (Signed)
Evan DUVAL Sr. TML:[id number] DOB: [birth year] DOA: 12/28/2021 ? ?PCP: Charlynne Cousins, MD ? ?Admit date: 12/28/2021 ?Discharge date: 12/29/2021 ? ?Admitted From: Home ?Disposition: Home ? ?Recommendations for Outpatient Follow-up:  ?Follow up with PCP in 1 week ?Please obtain BMP/CBC in one week ? ?..  ? ?Home Health:yes ? ? ?Discharge Condition:Stable ?CODE STATUS:full  ?Diet recommendation: Heart Healthy / Carb Modified  ?Brief/Interim Summary: ?Per KCL:EXNTZG E Carlo Guevarra. is a 66 y.o. male with medical history significant of recurrent strokes with residual mild left-sided weakness, paroxysmal atrial fibrillation on a/c, type 2 diabetes mellitus, peripheral vascular disease, tobacco abuse presents with altered mental status.  Patient's wife at bedside reports this morning patient was acting funny during breakfast. She tried feeding him, normally he feeds himself.  He could not stand up was about to fall.  He looked weak and staring at wife and could not talk.  At that point she checked his blood glucose which was 68 and gave him regular [facility].  Wife does report he has not been hydrating as much.  She actually had to feed him today and ate very little.  Denies any diarrhea, shortness of breath, any other neurologic symptoms or chest pain. ?Typically at baseline he is able to feed himself, he walks with a walker but occasionally needs assistant, and typically makes it to the bathroom and is incontinent and wears a pull-up. ?When the above happened, wife called EMS. She noted he had urinated and defecated on himself. ?ED Course: Blood pressure 144/83, heart rate 44-62 afebrile, respiratory rate 20, satting 97% room air. ?CODE STROKE was activated. Neurology was consulted.  CT of the head no evidence of acute intracranial abnormality.  MRI without any acute intracranial abnormality. ?Pertinent labs: Chloride 114, glucose 104, BUN 30, creatinine 1.79 baseline 1.27.  AST 95, ALT 121, ammonia 34, hemoglobin 9.2,  hematocrit 30.6, platelets 202, INR 1.5, PT 18.4 ?EKG personally reviewed sinus bradycardia, right bundle branch, prolonged PR interval. ? ?He was started on IV fluids.  His mental status improved the next day.  Was at baseline.  His baclofen was held on the day of admission and reduced on discharge. PT evaluated patient and patient was at baseline.  Discharged home since at baseline mental status.  Spoke to neurology would decrease baclofen to 59m tid to prevent withdrawal and f/u with pcp. I called wife and notified her of the new change/recommendation. ? ? ?1.Altered mental Status ?CT head are nonrevealing for acute intracranial process.  MRI also without acute disease.  Stroke ruled out. ?Likely due to toxic/metabolic process from medication and AKI ?Possibly likely buildup of baclofen in the setting of acute on chronic kidney disease ?Code stroke was called neurology saw patient and ruled out stroke ?IV fluids for hydration.  ?PT was consulted ?Toxicology negative ?His mental status improved as his creatinine improved with IV fluids/hydration ? ? ?  ?2. AKI on CKD stage III a ?Likely due to Likely prerenal from decrease po intake ?Baseline 1.2>>>1.79 on admission ?Improved with IV fluids ?Encouraged to continue hydration at home, discussed this with wife ?  ?  ?  ?3. DM2 with hyperlipidemia ?A1 C 6.2 on 11/12/21 ?Continue home meds ? ?  ?  ?  ?4. Hx/o afib  ?H/xo cva ?Continue anticoagulation ?  ?  ?5. Elevated LFT ?Chronic ?Follow-up with PCP for LFTs as he is on statin ?  ?6. Essential  HTN ?Continue home meds ?Follow-up with PCP for further manage ?  ? ? ? ?  Discharge Diagnoses:  ?Principal Problem: ?  Altered mental status ?Active Problems: ?  Acute kidney injury superimposed on CKD ([place]) ?  Bradycardia ?  Diarrhea ?  Recurrent strokes ([place]) ?  Atrial fibrillation, chronic ([place]) ?  Type 2 diabetes mellitus with hyperglycemia (HCC) ?  Elevated liver enzymes ? ? ? ?Discharge Instructions ? ?Discharge  Instructions   ? ? Call MD for:  severe uncontrolled pain   Complete by: As directed ?  ? Diet - low sodium heart healthy   Complete by: As directed ?  ? Discharge instructions   Complete by: As directed ?  ? Hydrate ?Follow-up with PCP for blood work ?Stop baclofen and follow-up with PCP for other medications  ? Increase activity slowly   Complete by: As directed ?  ? ?  ? ?Allergies as of 12/29/2021   ?No Known Allergies ?  ? ?  ?Medication List  ?  ? ?STOP taking these medications   ? ?baclofen 10 MG tablet ?Commonly known as: LIORESAL ?  ? ?  ? ?TAKE these medications   ? ?Accu-Chek Guide test strip ?Generic drug: glucose blood ?Check fsbs once daily ?  ?Accu-Chek Softclix Lancets lancets ?SMARTSIG:Topical ?  ?albuterol 108 (90 Base) MCG/ACT inhaler ?Commonly known as: VENTOLIN HFA ?Inhale 2 puffs into the lungs every 6 (six) hours as needed for wheezing or shortness of breath. ?  ?amLODipine 10 MG tablet ?Commonly known as: NORVASC ?Take 1 tablet (10 mg total) by mouth daily. ?  ?apixaban 5 MG Tabs tablet ?Commonly known as: Eliquis ?Take 1 tablet (5 mg total) by mouth 2 (two) times daily. ?  ?atorvastatin 80 MG tablet ?Commonly known as: LIPITOR ?Take 1 tablet (80 mg total) by mouth daily. ?What changed: when to take this ?  ?Blood Glucose Monitoring Suppl Kit ?Accucheck Guide with lancets and test strips #100 with one refill.  Test blood sugar twice daily. DX Code E11.65 ; Z79.4 ?  ?Comfort Protect Adult Diaper/L Misc ?1 Units by Does not apply route daily. Use 3-4 a day as needed ?  ?dapagliflozin propanediol 10 MG Tabs tablet ?Commonly known as: [place] ?Take 1 tablet (10 mg total) by mouth daily before breakfast. In place of glipizide ?  ?FreeStyle Libre 2 Sensor Misc ?1 each by Does not apply route every 14 (fourteen) days. ?  ?Insulin Pen Needle 32G X 6 MM Misc ?Daily ?  ?metoCLOPramide 5 MG tablet ?Commonly known as: REGLAN ?TAKE 1 TABLET BY MOUTH 3 TIMES DAILY BEFORE MEALS ?  ?Misc. Devices Misc ?One  pair of Compression stockings ? ?Hemiplegia and hemiparesis following cerebral infarction affecting left non-dominant side (HCC)  - Primary ?Codes: S85.462 ?Lower extremity edema  ?Codes: R60.0 ?Type 2 diabetes mellitus with hyperglycemia, with long-term current use of insulin ([place])  ?Codes: E11.65, Z79.4 ?  ?nitroGLYCERIN 0.4 MG SL tablet ?Commonly known as: NITROSTAT ?Place 1 tablet (0.4 mg total) under the tongue every 5 (five) minutes as needed for chest pain. ?  ?omeprazole 40 MG capsule ?Commonly known as: PRILOSEC ?TAKE 1 CAPSULE BY MOUTH IN THE MORNING AND AT BEDTIME ?What changed: See the new instructions. ?  ?tamsulosin 0.4 MG Caps capsule ?Commonly known as: FLOMAX ?Take 1 capsule (0.4 mg total) by mouth daily. ?  ? ?  ? ? ?No Known Allergies ? ?Consultations: ? ?Neurology ? ?Procedures/Studies: ?MR BRAIN WO CONTRAST ? ?Result Date: 12/28/2021 ?CLINICAL DATA:  Delirium. EXAM: MRI HEAD WITHOUT CONTRAST TECHNIQUE: Multiplanar, multiecho pulse sequences of the brain and surrounding  structures were obtained without intravenous contrast. COMPARISON:  Head CT and CTA 12/28/2021.  Head MRI 09/01/2018. FINDINGS: Brain: There is no evidence of an acute infarct, mass, midline shift, or extra-axial fluid collection. T2 hyperintensities in the cerebral white matter bilaterally have mildly progressed from the prior MRI and are nonspecific but compatible with moderate chronic small vessel ischemic disease. Multiple chronic lacunar infarcts are again noted involving the thalami and basal ganglia/deep white matter including posterior limb of the right internal capsule extending into the midbrain with wallerian degeneration. A small chronic right cerebellar infarct is new from the prior MRI. Chronic hemosiderin deposition is again seen in the right periatrial region and right basal ganglia. A chronic microhemorrhage in the left centrum semiovale appears new from the prior MRI. The ventricles are normal in size. Vascular:  Major intracranial vascular flow voids are preserved. Skull and upper cervical spine: Unremarkable bone marrow signal. Sinuses/Orbits: Unremarkable orbits. Mild mucosal thickening in the paranasal sinuses. Trace left

## 2021-12-29 NOTE — Evaluation (Signed)
Occupational Therapy Evaluation ?Patient Details ?Name: Evan KNOCH Sr. ?MRN: [id number] ?DOB: [birth year] ?Today's Date: 12/29/2021 ? ? 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 MRI of brain without acute process. AMS thought to be likely due to buildup of baclofen in the setting of acute on chronic kidney disease  ? ?Clinical Impression ?  ?Evan Townsend was seen for OT/PT co- evaluation this date. Prior to hospital admission, pt required assistance for mobility and ADLs as needed. Pt lives with spouse available 24/7 in home c 3 STE. Pt presents to acute OT demonstrating near baseline for functional mobility and ADLs. Pt requires assistance for RW mgmt (uses lofstrand crutch at baseline) however no direct physical assist for transfers. Assist for dressing - pt reports baseline. No acute OT needs identified. Will sign off. Upon hospital discharge, recommend no OT follow up.   ? ?Recommendations for follow up therapy are one component of a multi-disciplinary discharge planning process, led by the attending physician.  Recommendations may be updated based on patient status, additional functional criteria and insurance authorization.  ? ?Follow Up Recommendations ? No OT follow up  ?  ?Assistance Recommended at Discharge Intermittent Supervision/Assistance  ?Patient can return home with the following A little help with walking and/or transfers;A lot of help with bathing/dressing/bathroom ? ?  ?Functional Status Assessment ? Patient has not had a recent decline in their functional status  ?Equipment Recommendations ? BSC/3in1  ?  ?Recommendations for Other Services   ? ? ?  ?Precautions / Restrictions Precautions ?Precautions: Fall ?Restrictions ?Weight Bearing Restrictions: No  ? ?  ? ?Mobility Bed Mobility ?Overal bed  mobility: Needs Assistance ?Bed Mobility: Supine to Sit, Sit to Supine ?  ?  ?Supine to sit: Supervision ?Sit to supine: Min guard ?  ?  ?  ? ?Transfers ?Overall transfer level: Needs assistance ?Equipment used: Rolling walker (2 wheels) ?Transfers: Sit to/from Stand ?Sit to Stand: Supervision ?  ?  ?  ?  ?  ?  ?  ? ?  ?Balance Overall balance assessment: Needs assistance ?Sitting-balance support: Feet supported ?Sitting balance-Leahy Scale: Good ?  ?  ?Standing balance support: Bilateral upper extremity supported, Reliant on assistive device for balance ?Standing balance-Leahy Scale: Fair ?  ?  ?  ?  ?  ?  ?  ?  ?  ?  ?  ?  ?   ? ?ADL either performed or assessed with clinical judgement  ? ?ADL Overall ADL's : Needs assistance/impaired ?  ?  ?  ?  ?  ?  ?  ?  ?  ?  ?  ?  ?  ?  ?  ?  ?  ?  ?  ?General ADL Comments: MAX A don B socks at bed level - pt reports assist at baseline. CGA x2 + RW for ADL t/f - assist for RW mgmgt (baseline uses lofstrand crutch), +2 assist for lines mgmt  ? ? ? ? ?Pertinent Vitals/Pain Pain Assessment ?Pain Assessment: No/denies pain  ? ? ? ?Hand Dominance Right ?  ?Extremity/Trunk Assessment Upper Extremity Assessment ?Upper Extremity Assessment: LUE deficits/detail ?LUE Deficits / Details: chronic LUE weakness from CVA hx. high tone ?  ?Lower Extremity Assessment ?Lower Extremity Assessment: Defer to PT evaluation ?RLE Deficits / Details: dorsiflexion, plantarflexion, knee extension 5/5 ?RLE Sensation:  WNL ?LLE Deficits / Details: dorsiflexion 1/5, plantarflexion 2/5. chronic weakness from residual stroke deficits ?LLE Sensation: WNL ?LLE Coordination: decreased gross motor ?  ?  ?  ?Communication Communication ?Communication: Expressive difficulties (baseline) ?  ?Cognition Arousal/Alertness: Awake/alert ?Behavior During Therapy: [facility] for tasks assessed/performed ?Overall Cognitive Status: Within Functional Limits for tasks assessed ?  ?  ?  ?  ?  ?  ?  ?  ?  ?  ?  ?  ?  ?  ?  ?  ?  ?  ?   ?General Comments  HR 113 with mobility ? ?  ?   ?   ? ? ?Home Living Family/patient expects to be discharged to:: Private residence ?Living Arrangements: Spouse/significant other ?Available Help at Discharge: Family ?Type of Home: House ?Home Access: Stairs to enter ?  ?  ?Home Layout: One level ?  ?  ?Bathroom Shower/Tub: Tub/shower unit ?  ?Bathroom Toilet: Handicapped height ?Bathroom Accessibility: Yes ?  ?Home Equipment: Shower seat ?  ?  ?  ? ?  ?Prior Functioning/Environment Prior Level of Function : Independent/Modified Independent ?  ?  ?  ?  ?  ?  ?Mobility Comments: Mod I with lofstrand crutches ?ADLs Comments: Mod I, uses shower chair ?  ? ?  ?  ?OT Problem List: Decreased activity tolerance;Impaired balance (sitting and/or standing) ?  ?   ?OT Treatment/Interventions:    ?  ?OT Goals(Current goals can be found in the care plan section) Acute Rehab OT Goals ?Patient Stated Goal: to go home ?OT Goal Formulation: With patient ?Time For Goal Achievement: 01/12/22 ?Potential to Achieve Goals: Good  ?OT Frequency:   ?  ? ?Co-evaluation PT/OT/SLP Co-Evaluation/Treatment: Yes ?Reason for Co-Treatment: To address functional/ADL transfers ?PT goals addressed during session: Balance;Mobility/safety with mobility ?OT goals addressed during session: ADL's and self-care ?  ? ?  ?AM-PAC OT "6 Clicks" Daily Activity     ?Outcome Measure Help from another person eating meals?: A Little ?Help from another person taking care of personal grooming?: A Little ?Help from another person toileting, which includes using toliet, bedpan, or urinal?: A Lot ?Help from another person bathing (including washing, rinsing, drying)?: A Lot ?Help from another person to put on and taking off regular upper body clothing?: A Little ?Help from another person to put on and taking off regular lower body clothing?: A Lot ?6 Click Score: 15 ?  ?End of Session Equipment Utilized During Treatment: Rolling walker (2 wheels) ? ?Activity Tolerance:  Patient tolerated treatment well ?Patient left: in bed;with call bell/phone within reach ? ?OT Visit Diagnosis: Other abnormalities of gait and mobility (R26.89)  ?              ?Time: 4944-9675 ?OT Time Calculation (min): 15 min ?Charges:  OT General Charges ?$OT Visit: 1 Visit ?OT Evaluation ?$OT Eval Low Complexity: 1 Low ? ?Kathie Dike, M.S. OTR/L  ?12/29/21, 1:00 PM  ?ascom [phone] ? ?

## 2021-12-29 NOTE — Progress Notes (Signed)
Subjective: ?Much improved.  ? ?Exam: ?Vitals:  ? 12/29/21 1200 12/29/21 1410  ?BP: (!) 150/75 139/84  ?Pulse: (!) 52 (!) 51  ?Resp: 15 16  ?Temp:  97.6 ?F (36.4 ?C)  ?SpO2: 100% 99%  ? ?Gen: In bed, NAD ?Resp: non-labored breathing, no acute distress ?Abd: soft, nt ? ?Neuro: ?MS: awake, alert, gives year is 2003 ?CN:VFF, EOMI ?Motor: Spastic left hemipareiss.  ?Sensory:intact to LT ? ?Pertinent Labs: ?eGFR 56 ? ?Impression: 66 year old male who presented with altered mental status in the setting of likely prerenal AKI.  I think that baclofen toxicity is certainly a likely culprit, and would decrease his dose.  I think we could safely half his home dose, but would be hesitant to abruptly discontinue it.  At the decreased dose is not adequate to help control his spasticity, then I would consider Botox injections. ? ?Recommendations: ?1) restart baclofen to 5 mg 3 times daily (half his home dose) ? ? ?Roland Rack, MD ?[facility] ?[phone] ? ?If 7pm- 7am, please page neurology on call as listed in [place]. ? ?

## 2021-12-30 ENCOUNTER — Telehealth: Payer: Self-pay

## 2021-12-30 ENCOUNTER — Encounter: Payer: Medicaid Other | Admitting: Physical Therapy

## 2021-12-30 NOTE — Telephone Encounter (Signed)
Transition Care Management Unsuccessful Follow-up Telephone Call ? ?Date of discharge and from where:  12/29/2021-[facility] ? ?Attempts:  1st Attempt ? ?Reason for unsuccessful TCM follow-up call:  Unable to reach patient ? ?  ?

## 2021-12-31 ENCOUNTER — Ambulatory Visit: Payer: Medicaid Other

## 2021-12-31 NOTE — Telephone Encounter (Signed)
Transition Care Management Unsuccessful Follow-up Telephone Call ? ?Date of discharge and from where:  12/29/2021-[facility] ? ?Attempts:  2nd Attempt ? ?Reason for unsuccessful TCM follow-up call:  Unable to reach patient ? ?  ?

## 2022-01-01 ENCOUNTER — Encounter: Payer: Medicaid Other | Admitting: Physical Therapy

## 2022-01-01 ENCOUNTER — Encounter: Payer: Self-pay | Admitting: Internal Medicine

## 2022-01-01 ENCOUNTER — Ambulatory Visit: Payer: Medicaid Other | Admitting: Internal Medicine

## 2022-01-01 VITALS — BP 138/74 | HR 52 | Temp 97.9°F | Ht 65.98 in | Wt 145.0 lb

## 2022-01-01 DIAGNOSIS — E1159 Type 2 diabetes mellitus with other circulatory complications: Secondary | ICD-10-CM | POA: Diagnosis not present

## 2022-01-01 DIAGNOSIS — N179 Acute kidney failure, unspecified: Secondary | ICD-10-CM | POA: Diagnosis not present

## 2022-01-01 DIAGNOSIS — Z794 Long term (current) use of insulin: Secondary | ICD-10-CM

## 2022-01-01 DIAGNOSIS — E119 Type 2 diabetes mellitus without complications: Secondary | ICD-10-CM | POA: Insufficient documentation

## 2022-01-01 LAB — BAYER DCA HB A1C WAIVED: HB A1C (BAYER DCA - WAIVED): 6.1 % — ABNORMAL HIGH (ref 4.8–5.6)

## 2022-01-01 MED ORDER — DAPAGLIFLOZIN PROPANEDIOL 10 MG PO TABS: 10.0000 mg | ORAL_TABLET | Freq: Every day | ORAL | 0 refills | Status: DC

## 2022-01-01 MED ORDER — MIRTAZAPINE 7.5 MG PO TABS: 7.5000 mg | ORAL_TABLET | Freq: Every day | ORAL | 3 refills | Status: DC

## 2022-01-01 NOTE — Progress Notes (Signed)
BP 138/74   Pulse (!) 52   Temp 97.9 F (36.6 C) (Oral)   Ht 5' 5.98" (1.676 m)   Wt 145 lb (65.8 kg)   SpO2 99%   BMI 23.41 kg/m    Subjective:    Patient ID: Evan Cones Sr., male    DOB: Mar 21, 1956, 66 y.o.   MRN: [id number]  Chief Complaint  Patient presents with   Hospital f/u    HPI: Evan LOERA Sr. is a 66 y.o. male  Pt was in the ER for an acute dehydration and AMS was admitted for one day sec to mental status changes, of note pt was found to have a nl CT head and AKI as well sec to dehydration which improved with Iv fluids.    Chief Complaint  Patient presents with   Hospital f/u    Relevant past medical, surgical, family and social history reviewed and updated as indicated. Interim medical history since our last visit reviewed. Allergies and medications reviewed and updated.  Review of Systems  Constitutional:  Negative for activity change, appetite change, chills, fatigue and fever.  HENT:  Negative for congestion, ear discharge, ear pain and facial swelling.   Eyes:  Negative for pain, discharge and itching.  Respiratory:  Negative for cough, chest tightness, shortness of breath and wheezing.   Cardiovascular:  Negative for chest pain, palpitations and leg swelling.  Gastrointestinal:  Negative for abdominal distention, abdominal pain, blood in stool, constipation, diarrhea, nausea and vomiting.  Endocrine: Negative for cold intolerance, heat intolerance, polydipsia, polyphagia and polyuria.  Genitourinary:  Negative for difficulty urinating, dysuria, flank pain, frequency, hematuria and urgency.  Musculoskeletal:  Negative for arthralgias, gait problem, joint swelling and myalgias.  Skin:  Negative for color change, rash and wound.  Neurological:  Negative for dizziness, tremors, speech difficulty, weakness, light-headedness, numbness and headaches.  Hematological:  Does not bruise/bleed easily.  Psychiatric/Behavioral:  Negative for agitation,  confusion, decreased concentration, sleep disturbance and suicidal ideas.    Per HPI unless specifically indicated above     Objective:    BP 138/74   Pulse (!) 52   Temp 97.9 F (36.6 C) (Oral)   Ht 5' 5.98" (1.676 m)   Wt 145 lb (65.8 kg)   SpO2 99%   BMI 23.41 kg/m   Wt Readings from Last 3 Encounters:  01/01/22 145 lb (65.8 kg)  12/28/21 144 lb 9.6 oz (65.6 kg)  12/18/21 144 lb 9.6 oz (65.6 kg)    Physical Exam Vitals and nursing note reviewed.  Constitutional:      General: He is not in acute distress.    Appearance: Normal appearance. He is not ill-appearing or diaphoretic.  HENT:     Head: Normocephalic and atraumatic.     Right Ear: Tympanic membrane and external ear normal. There is no impacted cerumen.     Left Ear: External ear normal.     Nose: No congestion or rhinorrhea.     Mouth/Throat:     Pharynx: No oropharyngeal exudate or posterior oropharyngeal erythema.  Eyes:     Conjunctiva/sclera: Conjunctivae normal.     Pupils: Pupils are equal, round, and reactive to light.  Cardiovascular:     Rate and Rhythm: Normal rate and regular rhythm.     Heart sounds: No murmur heard.   No friction rub. No gallop.  Pulmonary:     Effort: No respiratory distress.     Breath sounds: No stridor. No wheezing or rhonchi.  Chest:     Chest wall: No tenderness.  Abdominal:     General: Abdomen is flat. Bowel sounds are normal.     Palpations: Abdomen is soft. There is no mass.     Tenderness: There is no abdominal tenderness.  Musculoskeletal:     Cervical back: Normal range of motion and neck supple. No rigidity or tenderness.     Left lower leg: No edema.  Skin:    General: Skin is warm and dry.  Neurological:     Mental Status: He is alert.    Results for orders placed or performed in visit on 01/01/22  Bayer DCA Hb A1c Waived (STAT)  Result Value Ref Range   HB A1C (BAYER DCA - WAIVED) 6.1 (H) 4.8 - 5.6 %        Current Outpatient Medications:     ACCU-CHEK GUIDE test strip, Check fsbs once daily, Disp: 100 each, Rfl: 2   Accu-Chek Softclix Lancets lancets, SMARTSIG:Topical, Disp: 100 each, Rfl: 1   albuterol (VENTOLIN HFA) 108 (90 Base) MCG/ACT inhaler, Inhale 2 puffs into the lungs every 6 (six) hours as needed for wheezing or shortness of breath., Disp: 8 g, Rfl: 1   amLODipine (NORVASC) 10 MG tablet, Take 1 tablet (10 mg total) by mouth daily., Disp: 90 tablet, Rfl: 1   apixaban (ELIQUIS) 5 MG TABS tablet, Take 1 tablet (5 mg total) by mouth 2 (two) times daily., Disp: 60 tablet, Rfl: 0   atorvastatin (LIPITOR) 80 MG tablet, Take 1 tablet (80 mg total) by mouth daily. (Patient taking differently: Take 80 mg by mouth every evening.), Disp: 90 tablet, Rfl: 1   Blood Glucose Monitoring Suppl KIT, Accucheck Guide with lancets and test strips #100 with one refill.  Test blood sugar twice daily. DX Code E11.65 ; Z79.4, Disp: 1 kit, Rfl: 0   Continuous Blood Gluc Sensor (FREESTYLE LIBRE 2 SENSOR) MISC, 1 each by Does not apply route every 14 (fourteen) days., Disp: 6 each, Rfl: 1   Incontinence Supply Disposable (COMFORT PROTECT ADULT DIAPER/L) MISC, 1 Units by Does not apply route daily. Use 3-4 a day as needed, Disp: 90 each, Rfl: 3   Insulin Pen Needle 32G X 6 MM MISC, Daily, Disp: 100 each, Rfl: 3   metoCLOPramide (REGLAN) 5 MG tablet, TAKE 1 TABLET BY MOUTH 3 TIMES DAILY BEFORE MEALS, Disp: 90 tablet, Rfl: 0   mirtazapine (REMERON) 7.5 MG tablet, Take 1 tablet (7.5 mg total) by mouth at bedtime., Disp: 30 tablet, Rfl: 3   Misc. Devices MISC, One pair of Compression stockings  Hemiplegia and hemiparesis following cerebral infarction affecting left non-dominant side (HCC)  - Primary Codes: O67.672 Lower extremity edema  Codes: R60.0 Type 2 diabetes mellitus with hyperglycemia, with long-term current use of insulin (HCC)  Codes: E11.65, Z79.4, Disp: 1 Units, Rfl: 0   nitroGLYCERIN (NITROSTAT) 0.4 MG SL tablet, Place 1 tablet (0.4 mg total) under  the tongue every 5 (five) minutes as needed for chest pain., Disp: 30 tablet, Rfl: 0   omeprazole (PRILOSEC) 40 MG capsule, TAKE 1 CAPSULE BY MOUTH IN THE MORNING AND AT BEDTIME (Patient taking differently: Take 40 mg by mouth in the morning and at bedtime.), Disp: 180 capsule, Rfl: 1   tamsulosin (FLOMAX) 0.4 MG CAPS capsule, Take 1 capsule (0.4 mg total) by mouth daily., Disp: 30 capsule, Rfl: 0   dapagliflozin propanediol (FARXIGA) 10 MG TABS tablet, Take 1 tablet (10 mg total) by mouth daily before breakfast. In  place of glipizide, Disp: 30 tablet, Rfl: 0    Assessment & Plan:  PNA Recheck CXR   2. DM continue farxiga to help with reno and cardiovascular protection check HbA1c,  urine  microalbumin  diabetic diet plan given to pt  adviced regarding hypoglycemia and instructions given to pt today on how to prevent and treat the same if it were to occur. pt acknowledges the plan and voices understanding of the same.  exercise plan given and encouraged.   advice diabetic yearly podiatry, ophthalmology , nutritionist , dental check q 6 months,  3. AKI improving Consider nephro referral  Recheck labs  Fu as scheudled.   Problem List Items Addressed This Visit       Endocrine   Diabetes mellitus ([place])   Relevant Medications   dapagliflozin propanediol (FARXIGA) 10 MG TABS tablet   Other Relevant Orders   CBC with Differential/Platelet   Comprehensive metabolic panel   Bayer DCA Hb A1c Waived (STAT) (Completed)     Genitourinary   Acute renal failure ([place]) - Primary   Relevant Orders   CBC with Differential/Platelet   Comprehensive metabolic panel   Bayer DCA Hb A1c Waived (STAT) (Completed)     Orders Placed This Encounter  Procedures   CBC with Differential/Platelet   Comprehensive metabolic panel   Bayer DCA Hb A1c Waived (STAT)     Meds ordered this encounter  Medications   mirtazapine (REMERON) 7.5 MG tablet    Sig: Take 1 tablet (7.5 mg total) by mouth at bedtime.     Dispense:  30 tablet    Refill:  3   dapagliflozin propanediol (FARXIGA) 10 MG TABS tablet    Sig: Take 1 tablet (10 mg total) by mouth daily before breakfast. In place of glipizide    Dispense:  30 tablet    Refill:  0     Follow up plan: Return in about 2 months (around 03/03/2022).

## 2022-01-02 LAB — CBC WITH DIFFERENTIAL/PLATELET
Basophils Absolute: 0 10*3/uL (ref 0.0–0.2)
Basos: 0 %
EOS (ABSOLUTE): 0.1 10*3/uL (ref 0.0–0.4)
Eos: 3 %
Hematocrit: 29.1 % — ABNORMAL LOW (ref 37.5–51.0)
Hemoglobin: 9.7 g/dL — ABNORMAL LOW (ref 13.0–17.7)
Immature Grans (Abs): 0 10*3/uL (ref 0.0–0.1)
Immature Granulocytes: 0 %
Lymphocytes Absolute: 1.2 10*3/uL (ref 0.7–3.1)
Lymphs: 51 %
MCH: 28 pg (ref 26.6–33.0)
MCHC: 33.3 g/dL (ref 31.5–35.7)
MCV: 84 fL (ref 79–97)
Monocytes Absolute: 0.2 10*3/uL (ref 0.1–0.9)
Monocytes: 9 %
Neutrophils Absolute: 0.9 10*3/uL — ABNORMAL LOW (ref 1.4–7.0)
Neutrophils: 37 %
Platelets: 222 10*3/uL (ref 150–450)
RBC: 3.47 x10E6/uL — ABNORMAL LOW (ref 4.14–5.80)
RDW: 15.8 % — ABNORMAL HIGH (ref 11.6–15.4)
WBC: 2.4 10*3/uL — CL (ref 3.4–10.8)

## 2022-01-02 LAB — COMPREHENSIVE METABOLIC PANEL
ALT: 105 IU/L — ABNORMAL HIGH (ref 0–44)
AST: 63 IU/L — ABNORMAL HIGH (ref 0–40)
Albumin/Globulin Ratio: 1.4 (ref 1.2–2.2)
Albumin: 4 g/dL (ref 3.8–4.8)
Alkaline Phosphatase: 194 IU/L — ABNORMAL HIGH (ref 44–121)
BUN/Creatinine Ratio: 13 (ref 10–24)
BUN: 23 mg/dL (ref 8–27)
Bilirubin Total: <0.2 mg/dL (ref 0.0–1.2)
CO2: 22 mmol/L (ref 20–29)
Calcium: 8.9 mg/dL (ref 8.6–10.2)
Chloride: 107 mmol/L — ABNORMAL HIGH (ref 96–106)
Creatinine, Ser: 1.72 mg/dL — ABNORMAL HIGH (ref 0.76–1.27)
Globulin, Total: 2.8 g/dL (ref 1.5–4.5)
Glucose: 116 mg/dL — ABNORMAL HIGH (ref 70–99)
Potassium: 4 mmol/L (ref 3.5–5.2)
Sodium: 144 mmol/L (ref 134–144)
Total Protein: 6.8 g/dL (ref 6.0–8.5)
eGFR: 44 mL/min/{1.73_m2} — ABNORMAL LOW (ref >59)

## 2022-01-02 NOTE — Telephone Encounter (Signed)
Transition Care Management Unsuccessful Follow-up Telephone Call  Date of discharge and from where:   12/29/2021-[facility]  Attempts:  3rd Attempt  Reason for unsuccessful TCM follow-up call:  Unable to reach patient

## 2022-01-06 ENCOUNTER — Emergency Department: Payer: Medicaid Other

## 2022-01-06 ENCOUNTER — Encounter: Payer: Medicaid Other | Admitting: Physical Therapy

## 2022-01-06 ENCOUNTER — Other Ambulatory Visit: Payer: Self-pay

## 2022-01-06 ENCOUNTER — Ambulatory Visit (INDEPENDENT_AMBULATORY_CARE_PROVIDER_SITE_OTHER): Payer: Medicaid Other | Admitting: Internal Medicine

## 2022-01-06 ENCOUNTER — Ambulatory Visit: Payer: Self-pay

## 2022-01-06 ENCOUNTER — Other Ambulatory Visit: Payer: Self-pay | Admitting: Internal Medicine

## 2022-01-06 ENCOUNTER — Inpatient Hospital Stay
Admission: EM | Admit: 2022-01-06 | Discharge: 2022-01-11 | DRG: 871 | Disposition: A | Discharge status: Home / Self Care | Payer: Medicaid Other | Attending: Hospitalist | Admitting: Hospitalist

## 2022-01-06 ENCOUNTER — Other Ambulatory Visit: Payer: Medicaid Other

## 2022-01-06 ENCOUNTER — Telehealth: Payer: Self-pay | Admitting: Medical

## 2022-01-06 DIAGNOSIS — N179 Acute kidney failure, unspecified: Secondary | ICD-10-CM | POA: Diagnosis not present

## 2022-01-06 DIAGNOSIS — E1159 Type 2 diabetes mellitus with other circulatory complications: Secondary | ICD-10-CM

## 2022-01-06 DIAGNOSIS — N400 Enlarged prostate without lower urinary tract symptoms: Secondary | ICD-10-CM

## 2022-01-06 DIAGNOSIS — D631 Anemia in chronic kidney disease: Secondary | ICD-10-CM | POA: Diagnosis present

## 2022-01-06 DIAGNOSIS — I480 Paroxysmal atrial fibrillation: Secondary | ICD-10-CM

## 2022-01-06 DIAGNOSIS — A419 Sepsis, unspecified organism: Principal | ICD-10-CM

## 2022-01-06 DIAGNOSIS — N401 Enlarged prostate with lower urinary tract symptoms: Secondary | ICD-10-CM | POA: Diagnosis not present

## 2022-01-06 DIAGNOSIS — I25110 Atherosclerotic heart disease of native coronary artery with unstable angina pectoris: Secondary | ICD-10-CM | POA: Diagnosis present

## 2022-01-06 DIAGNOSIS — Z87891 Personal history of nicotine dependence: Secondary | ICD-10-CM

## 2022-01-06 DIAGNOSIS — Z833 Family history of diabetes mellitus: Secondary | ICD-10-CM

## 2022-01-06 DIAGNOSIS — I10 Essential (primary) hypertension: Secondary | ICD-10-CM

## 2022-01-06 DIAGNOSIS — G8194 Hemiplegia, unspecified affecting left nondominant side: Secondary | ICD-10-CM

## 2022-01-06 DIAGNOSIS — E7800 Pure hypercholesterolemia, unspecified: Secondary | ICD-10-CM | POA: Diagnosis present

## 2022-01-06 DIAGNOSIS — I452 Bifascicular block: Secondary | ICD-10-CM | POA: Diagnosis present

## 2022-01-06 DIAGNOSIS — D649 Anemia, unspecified: Secondary | ICD-10-CM

## 2022-01-06 DIAGNOSIS — I69354 Hemiplegia and hemiparesis following cerebral infarction affecting left non-dominant side: Secondary | ICD-10-CM

## 2022-01-06 DIAGNOSIS — Z823 Family history of stroke: Secondary | ICD-10-CM

## 2022-01-06 DIAGNOSIS — Z79899 Other long term (current) drug therapy: Secondary | ICD-10-CM

## 2022-01-06 DIAGNOSIS — I129 Hypertensive chronic kidney disease with stage 1 through stage 4 chronic kidney disease, or unspecified chronic kidney disease: Secondary | ICD-10-CM | POA: Diagnosis present

## 2022-01-06 DIAGNOSIS — K219 Gastro-esophageal reflux disease without esophagitis: Secondary | ICD-10-CM

## 2022-01-06 DIAGNOSIS — R680 Hypothermia, not associated with low environmental temperature: Secondary | ICD-10-CM | POA: Diagnosis present

## 2022-01-06 DIAGNOSIS — R471 Dysarthria and anarthria: Secondary | ICD-10-CM

## 2022-01-06 DIAGNOSIS — D72819 Decreased white blood cell count, unspecified: Secondary | ICD-10-CM

## 2022-01-06 DIAGNOSIS — Z8673 Personal history of transient ischemic attack (TIA), and cerebral infarction without residual deficits: Secondary | ICD-10-CM

## 2022-01-06 DIAGNOSIS — R338 Other retention of urine: Secondary | ICD-10-CM | POA: Diagnosis not present

## 2022-01-06 DIAGNOSIS — T68XXXA Hypothermia, initial encounter: Secondary | ICD-10-CM | POA: Diagnosis not present

## 2022-01-06 DIAGNOSIS — Z8249 Family history of ischemic heart disease and other diseases of the circulatory system: Secondary | ICD-10-CM

## 2022-01-06 DIAGNOSIS — Z794 Long term (current) use of insulin: Secondary | ICD-10-CM

## 2022-01-06 DIAGNOSIS — E785 Hyperlipidemia, unspecified: Secondary | ICD-10-CM

## 2022-01-06 DIAGNOSIS — R7989 Other specified abnormal findings of blood chemistry: Secondary | ICD-10-CM

## 2022-01-06 DIAGNOSIS — R001 Bradycardia, unspecified: Secondary | ICD-10-CM | POA: Diagnosis not present

## 2022-01-06 DIAGNOSIS — I69322 Dysarthria following cerebral infarction: Secondary | ICD-10-CM

## 2022-01-06 DIAGNOSIS — J9811 Atelectasis: Secondary | ICD-10-CM | POA: Diagnosis present

## 2022-01-06 DIAGNOSIS — N1832 Chronic kidney disease, stage 3b: Secondary | ICD-10-CM | POA: Diagnosis present

## 2022-01-06 DIAGNOSIS — E1322 Other specified diabetes mellitus with diabetic chronic kidney disease: Secondary | ICD-10-CM | POA: Diagnosis present

## 2022-01-06 DIAGNOSIS — R6520 Severe sepsis without septic shock: Principal | ICD-10-CM

## 2022-01-06 DIAGNOSIS — I739 Peripheral vascular disease, unspecified: Secondary | ICD-10-CM | POA: Diagnosis present

## 2022-01-06 DIAGNOSIS — J189 Pneumonia, unspecified organism: Secondary | ICD-10-CM

## 2022-01-06 DIAGNOSIS — G459 Transient cerebral ischemic attack, unspecified: Secondary | ICD-10-CM

## 2022-01-06 DIAGNOSIS — D509 Iron deficiency anemia, unspecified: Secondary | ICD-10-CM | POA: Diagnosis present

## 2022-01-06 DIAGNOSIS — I441 Atrioventricular block, second degree: Secondary | ICD-10-CM | POA: Diagnosis present

## 2022-01-06 DIAGNOSIS — Y95 Nosocomial condition: Secondary | ICD-10-CM | POA: Diagnosis present

## 2022-01-06 DIAGNOSIS — Z7901 Long term (current) use of anticoagulants: Secondary | ICD-10-CM

## 2022-01-06 DIAGNOSIS — D709 Neutropenia, unspecified: Secondary | ICD-10-CM | POA: Diagnosis present

## 2022-01-06 LAB — TROPONIN I (HIGH SENSITIVITY)
Troponin I (High Sensitivity): 7 ng/L (ref <18)
Troponin I (High Sensitivity): 7 ng/L (ref <18)

## 2022-01-06 LAB — CBC
HCT: 27.6 % — ABNORMAL LOW (ref 39.0–52.0)
Hemoglobin: 8.6 g/dL — ABNORMAL LOW (ref 13.0–17.0)
MCH: 27.7 pg (ref 26.0–34.0)
MCHC: 31.2 g/dL (ref 30.0–36.0)
MCV: 88.7 fL (ref 80.0–100.0)
Platelets: 212 10*3/uL (ref 150–400)
RBC: 3.11 MIL/uL — ABNORMAL LOW (ref 4.22–5.81)
RDW: 17.2 % — ABNORMAL HIGH (ref 11.5–15.5)
WBC: 3 10*3/uL — ABNORMAL LOW (ref 4.0–10.5)
nRBC: 0.7 % — ABNORMAL HIGH (ref 0.0–0.2)

## 2022-01-06 LAB — BASIC METABOLIC PANEL
Anion gap: 8 (ref 5–15)
BUN: 25 mg/dL — ABNORMAL HIGH (ref 8–23)
CO2: 23 mmol/L (ref 22–32)
Calcium: 8.8 mg/dL — ABNORMAL LOW (ref 8.9–10.3)
Chloride: 106 mmol/L (ref 98–111)
Creatinine, Ser: 1.65 mg/dL — ABNORMAL HIGH (ref 0.61–1.24)
GFR, Estimated: 46 mL/min — ABNORMAL LOW (ref >60)
Glucose, Bld: 174 mg/dL — ABNORMAL HIGH (ref 70–99)
Potassium: 3.5 mmol/L (ref 3.5–5.1)
Sodium: 137 mmol/L (ref 135–145)

## 2022-01-06 NOTE — Telephone Encounter (Signed)
Yes needs it but he is headed to the ER today they will d/c him on appropriate meds

## 2022-01-06 NOTE — ED Notes (Signed)
Unable to obtain temp orally or axillary

## 2022-01-06 NOTE — ED Notes (Signed)
Blue top sent. Attempting to get oral and axillary temp with no success.

## 2022-01-06 NOTE — Telephone Encounter (Signed)
Please advise. Should patient be taking farxiga? I do not see baclofen on medication list.

## 2022-01-06 NOTE — Telephone Encounter (Signed)
Summary: FU for Pharmacist Kenard Gower) specfic re clarification on meds   Pharmacist,  Ask for Kenard Gower [phone]  States pt said he was told by Dr Charlotta Newton to not take Baclofen and dapagliflozin propanediol (FARXIGA) 10 MG TABS tablet . States do not have any instruction on that and wants further clarification.     [facility] - [place], [place] - [address]  [address] [place] [address]  Phone: [phone] Fax: [phone]  Hours: Not open 24 hour     Pharmacy reports pt. Was told to stop [place] and Baclofen. Needs clarification. Please advise pharmacy. Answer Assessment - Initial Assessment Questions 1. NAME of MEDICATION: "What medicine are you calling about?"     Pharmacy states pt. Was told by PCP to stop [place] and Baclofen. Asking for clarification. 2. QUESTION: "What is your question?" (e.g., double dose of medicine, side effect)     Have medications been d/c.? 3. PRESCRIBING HCP: "Who prescribed it?" Reason: if prescribed by specialist, call should be referred to that group.     Dr. Charlotta Newton 4. SYMPTOMS: "Do you have any symptoms?"     N/a 5. SEVERITY: If symptoms are present, ask "Are they mild, moderate or severe?"     N/a 6. PREGNANCY:  "Is there any chance that you are pregnant?" "When was your last menstrual period?"     N/a  Protocols used: Medication Question Call-A-AH

## 2022-01-06 NOTE — Telephone Encounter (Signed)
Spoke with patients wife per release form and she reviewed previous information listed and states that EMS told him he was not having heart attack and therefore he refused to go for further evaluation. She wanted to know if he should wait till tomorrow's appointment. Reviewed that he would need to proceed to ED so they can do a complete work up and that he should not wait. She verbalized understanding and will take him there at this time. She was appreciative for the call with no further questions.

## 2022-01-06 NOTE — ED Triage Notes (Signed)
Pt to ED for chest pain under L nipple line since last night. States pain is dull, radiates to L neck, 9/10.   Pt has hx stroke, L arm contracted, dysarthria at baseline.  Wife states pt was just in hospital for heart and kidney issues. States they were at PCP office today who drew bloodwork and "everything was low" on bloodwork and PCP told them to come to ER.

## 2022-01-06 NOTE — Telephone Encounter (Signed)
Pt c/o of Chest Pain: STAT if CP now or developed within 24 hours  1. Are you having CP right now? A little, patient's wife states she called EMS this morning and patient refused to go to emergency department due to his appointment at our office tomorrow.  She states EMS did do an EKG and told patient "he was not having a heart attack" 2. Are you experiencing any other symptoms (ex. SOB, nausea, vomiting, sweating)? No other symptoms   3. How long have you been experiencing CP? Since last night.  4. Is your CP continuous or coming and going? Stopped for a minute or so.  5. Have you taken Nitroglycerin? Yes, at 4:30 this morning. ?

## 2022-01-06 NOTE — ED Provider Notes (Incomplete)
[facility] Provider Note    Event Date/Time   First MD Initiated Contact with Patient 01/06/22 2318     (approximate)   History   Chest Pain   HPI  Evan HUCKLEBY Sr. is a 66 y.o. 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  He presents tonight with his wife for evaluation of chest pain.  The chest pain started at approximately 4 AM today (about 20 hours ago).  Reportedly they called EMS but were told it was not a heart attack so the patient refused transport.  They followed up with primary care for a lab draw today and were told that "all his levels are low" and were told to go to the emergency department.  Of note, the wife also reports that his speech has been almost unintelligible.  This has been going on since his hospitalization; he was discharged about 1 week ago).  She thinks it is worse over the last 24 hours but she said that his speech was not an issue before but it has become more of a problem.  She has a hard time understanding him and that is never been the case.  Additionally, along with the ongoing chest pain, he feels very weak, almost to the point of not being able to get out of bed.  Normally he is able to ambulate with assistive devices in spite of the hemiparesis but he cannot do so anymore.  He has not been having any vomiting.  He denies shortness of breath.  No known recent fever.   Physical Exam   Triage Vital Signs: ED Triage Vitals  Enc Vitals Group     BP 01/06/22 1618 (!) 147/79     Pulse Rate 01/06/22 1618 (!) 54     Resp 01/06/22 1618 16     Temp --      Temp Source 01/06/22 1618 Oral     SpO2 01/06/22 1618 100 %     Weight 01/06/22 1614 65.7 kg (144 lb 13.5 oz)     Height 01/06/22 1614 1.651 m  (5\' 5" )     Head Circumference --      Peak Flow --      Pain Score 01/06/22 1613 9     Pain Loc --      Pain Edu? --      Excl. in [place]? --     Most recent vital signs: Vitals:   01/06/22 2300 01/06/22 2316  BP: 102/64   Pulse: (!) 34 (!) 40  Resp: 10 14  SpO2: 94% 100%     General: Awake, no distress. *** CV:  Good peripheral perfusion. *** Resp:  Normal effort. *** Abd:  No distention. *** Other:  ***   ED Results / Procedures / Treatments   Labs (all labs ordered are listed, but only abnormal results are displayed) Labs Reviewed  BASIC METABOLIC PANEL - Abnormal; Notable for the following components:      Result Value   Glucose, Bld 174 (*)    BUN 25 (*)    Creatinine, Ser 1.65 (*)    Calcium 8.8 (*)    GFR, Estimated 46 (*)    All other components within normal limits  CBC - Abnormal; Notable for the following components:   WBC 3.0 (*)  RBC 3.11 (*)    Hemoglobin 8.6 (*)    HCT 27.6 (*)    RDW 17.2 (*)    nRBC 0.7 (*)    All other components within normal limits  HEPATIC FUNCTION PANEL  TROPONIN I (HIGH SENSITIVITY)  TROPONIN I (HIGH SENSITIVITY)     EKG  ED ECG REPORT #1 I, Hinda Kehr, the attending physician, personally viewed and interpreted this ECG.  Date: 01/06/2022 EKG Time: 16: 15 Rate: 62 Rhythm: Sinus rhythm with first-degree AV block QRS Axis: normal Intervals: PR interval 214 ms, right bundle branch block ST/T Wave abnormalities: Non-specific ST segment / T-wave changes, but no clear evidence of acute ischemia. Narrative Interpretation: no definitive evidence of acute ischemia; does not meet STEMI criteria.   ED ECG REPORT #2 I, Hinda Kehr, the attending physician, personally viewed and interpreted this ECG.  Date: 01/06/2022 EKG Time: 23: 31 Rate: 39 Rhythm: Sinus bradycardia QRS Axis: normal Intervals: Right bundle branch block ST/T Wave abnormalities: Non-specific ST segment / T-wave changes, but no clear evidence of  acute ischemia. Narrative Interpretation: no definitive evidence of acute ischemia; does not meet STEMI criteria.   RADIOLOGY ***    PROCEDURES:  Critical Care performed: {CriticalCareYesNo:19197::"Yes, see critical care procedure note(s)","No"}  .1-3 Lead EKG Interpretation Performed by: Hinda Kehr, MD Authorized by: Hinda Kehr, MD     Interpretation: abnormal     ECG rate:  39   ECG rate assessment: bradycardic     Rhythm: sinus bradycardia     Ectopy: none     Conduction: normal     MEDICATIONS ORDERED IN ED: Medications - No data to display   IMPRESSION / MDM / [place] / ED COURSE  I reviewed the triage vital signs and the nursing notes.                              Differential diagnosis includes, but is not limited to, ***  Patient's presentation is most consistent with {EM COPA:27473}  {If the patient is on the monitor, remove the brackets and asterisks on the sentence below and remember to document it as a Procedure as well. Otherwise delete the sentence below:1} {**The patient is on the cardiac monitor to evaluate for evidence of arrhythmia and/or significant heart rate changes.**} {Remember to include, when applicable, any/all of the following data: independent review of imaging independent review of labs (comment specifically on pertinent positives and negatives) review of specific prior hospitalizations, PCP/specialist notes, etc. discuss meds given and prescribed document any discussion with consultants (including hospitalists) any clinical decision tools you used and why (PECARN, NEXUS, etc.) did you consider admitting the patient? document social determinants of health affecting patient's care (homelessness, inability to follow up in a timely fashion, etc) document any pre-existing conditions increasing risk on current visit (e.g. diabetes and HTN increasing danger of high-risk chest pain/ACS) describes what meds you gave (especially  parenteral) and why any other interventions?:1}     FINAL CLINICAL IMPRESSION(S) / ED DIAGNOSES   Final diagnoses:  None     Rx / DC Orders   ED Discharge Orders     None        Note:  This document was prepared using Dragon voice recognition software and may include unintentional dictation errors.

## 2022-01-06 NOTE — ED Provider Notes (Signed)
[facility] Provider Note    Event Date/Time   First MD Initiated Contact with Patient 01/06/22 2318     (approximate)   History   Chest Pain   HPI  Evan COSSETTE Sr. is a 66 y.o. 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  He presents tonight with his wife for evaluation of chest pain.  The chest pain started at approximately 4 AM today (about 20 hours ago).  Reportedly they called EMS but were told it was not a heart attack so the patient refused transport.  They followed up with primary care for a lab draw today and were told that "all his levels are low" and were told to go to the emergency department.  Of note, the wife also reports that his speech has been almost unintelligible.  This has been going on since his hospitalization; he was discharged about 1 week ago).  She thinks it is worse over the last 24 hours but she said that his speech was not an issue before but it has become more of a problem.  She has a hard time understanding him and that is never been the case.  Additionally, along with the ongoing chest pain, he feels very weak, almost to the point of not being able to get out of bed.  Normally he is able to ambulate with assistive devices in spite of the hemiparesis but he cannot do so anymore.  He has not been having any vomiting.  He denies shortness of breath.  No known recent fever.   Physical Exam   Triage Vital Signs: ED Triage Vitals  Enc Vitals Group     BP 01/06/22 1618 (!) 147/79     Pulse Rate 01/06/22 1618 (!) 54     Resp 01/06/22 1618 16     Temp --      Temp Source 01/06/22 1618 Oral     SpO2 01/06/22 1618 100 %     Weight 01/06/22 1614 65.7 kg (144 lb 13.5 oz)     Height 01/06/22 1614 1.651 m  (5\' 5" )     Head Circumference --      Peak Flow --      Pain Score 01/06/22 1613 9     Pain Loc --      Pain Edu? --      Excl. in [place]? --     Most recent vital signs: Vitals:   01/07/22 0500 01/07/22 0520  BP: (!) 149/82 (!) 148/84  Pulse: (!) 42 (!) 43  Resp: 17 (!) 9  Temp: (!) 90.9 F (32.7 C) (!) 91.2 F (32.9 C)  SpO2: 100% 100%     General: Awake, appearance of chronic illness but nontoxic at this point. CV:  Extremities feel cold to the touch.  Normal heart sounds. Resp:  Normal effort.  No accessory muscle usage.  Lungs are clear to auscultation bilaterally. Abd:  No distention.  No tenderness to palpation. Other:  Chronic left-sided hemiparesis which seems to be at his baseline.  However, he has marked dysarthria which his wife says is new over the course of the last week and worse over the last 24 hours.    ED Results / Procedures / Treatments   Labs (all labs ordered are listed, but only abnormal results  are displayed) Labs Reviewed  BASIC METABOLIC PANEL - Abnormal; Notable for the following components:      Result Value   Glucose, Bld 174 (*)    BUN 25 (*)    Creatinine, Ser 1.65 (*)    Calcium 8.8 (*)    GFR, Estimated 46 (*)    All other components within normal limits  CBC - Abnormal; Notable for the following components:   WBC 3.0 (*)    RBC 3.11 (*)    Hemoglobin 8.6 (*)    HCT 27.6 (*)    RDW 17.2 (*)    nRBC 0.7 (*)    All other components within normal limits  HEPATIC FUNCTION PANEL - Abnormal; Notable for the following components:   Albumin 3.4 (*)    ALT 77 (*)    Alkaline Phosphatase 140 (*)    All other components within normal limits  APTT - Abnormal; Notable for the following components:   aPTT 42 (*)    All other components within normal limits  URINALYSIS, COMPLETE (UACMP) WITH MICROSCOPIC - Abnormal; Notable for the following components:   Color, Urine STRAW (*)    APPearance HAZY (*)    Glucose, UA >=500 (*)    Hgb urine  dipstick SMALL (*)    Protein, ur 100 (*)    Bacteria, UA RARE (*)    All other components within normal limits  BASIC METABOLIC PANEL - Abnormal; Notable for the following components:   Chloride 113 (*)    BUN 25 (*)    Creatinine, Ser 1.57 (*)    Calcium 8.5 (*)    GFR, Estimated 49 (*)    All other components within normal limits  CBC - Abnormal; Notable for the following components:   WBC 3.7 (*)    RBC 2.87 (*)    Hemoglobin 7.9 (*)    HCT 25.3 (*)    RDW 17.1 (*)    nRBC 0.5 (*)    All other components within normal limits  LIPID PANEL - Abnormal; Notable for the following components:   Triglycerides 181 (*)    All other components within normal limits  CBG MONITORING, ED - Abnormal; Notable for the following components:   Glucose-Capillary 103 (*)    All other components within normal limits  CULTURE, BLOOD (SINGLE)  CULTURE, BLOOD (SINGLE)  URINE CULTURE  MRSA NEXT GEN BY PCR, NASAL  LACTIC ACID, PLASMA  PROTIME-INR  LACTIC ACID, PLASMA  GLUCOSE, CAPILLARY  THYROID PANEL WITH TSH  TROPONIN I (HIGH SENSITIVITY)  TROPONIN I (HIGH SENSITIVITY)  TROPONIN I (HIGH SENSITIVITY)     EKG  ED ECG REPORT #1 I, Hinda Kehr, the attending physician, personally viewed and interpreted this ECG.  Date: 01/06/2022 EKG Time: 16: 15 Rate: 62 Rhythm: Sinus rhythm with first-degree AV block QRS Axis: normal Intervals: PR interval 214 ms, right bundle branch block ST/T Wave abnormalities: Non-specific ST segment / T-wave changes, but no clear evidence of acute ischemia. Narrative Interpretation: no definitive evidence of acute ischemia; does not meet STEMI criteria.   ED ECG REPORT #2 I, Hinda Kehr, the attending physician, personally viewed and interpreted this ECG.  Date: 01/06/2022 EKG Time: 23: 31 Rate: 39 Rhythm: Sinus bradycardia QRS Axis: normal Intervals: Right bundle branch block ST/T Wave abnormalities: Non-specific ST segment / T-wave changes, but no  clear evidence of acute ischemia. Narrative Interpretation: no definitive evidence of acute ischemia; does not meet STEMI criteria.   RADIOLOGY See hospital course for details.  Chest x-ray generally unremarkable, CT chest without contrast suggests pneumonia, CT head without acute abnormality but with some degenerative changes.    PROCEDURES:  Critical Care performed: Yes, see critical care procedure note(s)  .1-3 Lead EKG Interpretation Performed by: Hinda Kehr, MD Authorized by: Hinda Kehr, MD     Interpretation: abnormal     ECG rate:  39   ECG rate assessment: bradycardic     Rhythm: sinus bradycardia     Ectopy: none     Conduction: normal   .Critical Care Performed by: Hinda Kehr, MD Authorized by: Hinda Kehr, MD   Critical care provider statement:    Critical care time (minutes):  60   Critical care time was exclusive of:  Separately billable procedures and treating other patients   Critical care was necessary to treat or prevent imminent or life-threatening deterioration of the following conditions:  Sepsis   Critical care was time spent personally by me on the following activities:  Development of treatment plan with patient or surrogate, evaluation of patient's response to treatment, examination of patient, obtaining history from patient or surrogate, ordering and performing treatments and interventions, ordering and review of laboratory studies, ordering and review of radiographic studies, pulse oximetry, re-evaluation of patient's condition and review of old charts   MEDICATIONS ORDERED IN ED: Medications  vancomycin (VANCOREADY) IVPB 1500 mg/300 mL (has no administration in time range)  atorvastatin (LIPITOR) tablet 80 mg (80 mg Oral Not Given 01/07/22 0306)  nitroGLYCERIN (NITROSTAT) SL tablet 0.4 mg (0.4 mg Sublingual Given 01/07/22 0519)  mirtazapine (REMERON) tablet 7.5 mg (7.5 mg Oral Not Given 01/07/22 0307)  dapagliflozin propanediol (FARXIGA)  tablet 10 mg (has no administration in time range)  pantoprazole (PROTONIX) EC tablet 80 mg (has no administration in time range)  tamsulosin (FLOMAX) capsule 0.4 mg (has no administration in time range)  apixaban (ELIQUIS) tablet 5 mg (5 mg Oral Not Given 01/07/22 0305)  albuterol (PROVENTIL) (2.5 MG/3ML) 0.083% nebulizer solution 2.5 mg (has no administration in time range)  cefTRIAXone (ROCEPHIN) 2 g in sodium chloride 0.9 % 100 mL IVPB (has no administration in time range)  azithromycin (ZITHROMAX) 500 mg in sodium chloride 0.9 % 250 mL IVPB (has no administration in time range)  0.9 % NaCl with KCl 20 mEq/ L  infusion (has no administration in time range)  acetaminophen (TYLENOL) tablet 650 mg (has no administration in time range)    Or  acetaminophen (TYLENOL) suppository 650 mg (has no administration in time range)  traZODone (DESYREL) tablet 25 mg (has no administration in time range)  magnesium hydroxide (MILK OF MAGNESIA) suspension 30 mL (has no administration in time range)  ondansetron (ZOFRAN) tablet 4 mg (has no administration in time range)    Or  ondansetron (ZOFRAN) injection 4 mg (has no administration in time range)   stroke: early stages of recovery book (has no administration in time range)  aspirin EC tablet 81 mg (has no administration in time range)  Chlorhexidine Gluconate Cloth 2 % PADS 6 each (has no administration in time range)  morphine (PF) 2 MG/ML injection 2 mg (has no administration in time range)  lactated ringers bolus 1,000 mL (0 mLs Intravenous Stopped 01/07/22 0136)    And  lactated ringers bolus 1,000 mL (0 mLs Intravenous Stopped 01/07/22 0454)  ceFEPIme (MAXIPIME) 2 g in sodium chloride 0.9 % 100 mL IVPB (0 g Intravenous Stopped 01/07/22 0133)  atropine 1 MG/10ML injection 0.5 mg (0.5 mg Intravenous Given 01/07/22 0233)  IMPRESSION / MDM / ASSESSMENT AND PLAN / ED COURSE  I reviewed the triage vital signs and the nursing notes.                               Differential diagnosis includes, but is not limited to, sepsis, pneumonia, UTI, CVA, acute intracranial bleed, electrolyte or metabolic abnormality.  Patient's presentation is most consistent with acute presentation with potential threat to life or bodily function.  He also has the possibility of having acute exacerbations of his chronic conditions such as sequela from his CVA, acute on chronic renal failure, etc.  EKG is somewhat difficult to appreciate because of first looks like a junctional rhythm, but I believe I see P waves.  However he has profound bradycardia with a heart rate in the upper 30s to lower 40s.  This may be part of the reason he feels generally weak.  Unclear if this is the cause of his other symptoms or the result of his other symptoms.  Labs ordered include CMP, CBC with differential, lactic acid, high-sensitivity troponin.  I also ordered urinalysis.  Results notable for leukopenia with a WBC of 3, decreasing hemoglobin of 8.6 (down from 9.7 previously).  Creatinine is more or less stable, up a little bit from his apparent baseline but lower than it was before.  No significant electrolyte abnormalities.  The patient is on the cardiac monitor to evaluate for evidence of arrhythmia and/or significant heart rate changes.  Anticipate admission given the patient's multiple acute issues and acute on chronic problems.  No indication for pacing at this time.  Clinical Course as of 01/07/22 0535  Wed Jan 07, 2022  0020 I have viewed and interpreted the patient's head and chest CT scans.  I see the area in the lung was mentioned on the x-ray concerning for possible developing infiltrate.  I see no large consolidation.  Radiology agrees.  There is no evidence of acute bleeding on the head CT.  I ordered lactic acid, single blood culture, and rectal temperature on the patient given the possibility of worsening pneumonia. [CF]  0038 I was in the room with the nurse when she  checked his rectal temperature and it is 90.6.  I am making him a code sepsis and treating empirically with cefepime 2 g IV and vancomycin per pharmacy consults, suspecting healthcare associated pneumonia.  She will also be getting in and out catheterization specimen for urine.  He has no abdominal tenderness and there is no indication to cover with Flagyl at this time nor to obtain imaging of his abdomen and pelvis.  I will not use the Bair hugger right now because of afraid that it will result in vasodilation and hypotension, though we may reconsider this moving forward.  I am ordering a 1 L LR IV bolus as well. [CF]  0120 Lactic acid is within normal limits.  Urinalysis is generally unremarkable other than glucosuria and rare bacteria.  Urine culture is pending.  Consulting hospitalist for admission [CF]  0129 Discussed case by phone with Dr. Sidney Ace.  He will consult on the patient and admit.  He agrees with the plan including no MRI at this time given the lack of urgency for the study and the concern with sending the patient over to the MRI given all his ongoing issues. [CF]    Clinical Course User Index [CF] Hinda Kehr, MD     FINAL CLINICAL IMPRESSION(S) /  ED DIAGNOSES   Final diagnoses:  Sepsis with acute organ dysfunction without septic shock, due to unspecified organism, unspecified type ([place])  Symptomatic bradycardia  Hypothermia, initial encounter  Anemia, unspecified type  Leukopenia, unspecified type  Acute kidney injury ([place])  Healthcare-associated pneumonia  Dysarthria  Left hemiparesis ([place])     Rx / DC Orders   ED Discharge Orders     None        Note:  This document was prepared using Dragon voice recognition software and may include unintentional dictation errors.   Hinda Kehr, MD 01/07/22 [phone]

## 2022-01-07 ENCOUNTER — Encounter: Payer: Self-pay | Admitting: Family Medicine

## 2022-01-07 ENCOUNTER — Inpatient Hospital Stay (HOSPITAL_BASED_OUTPATIENT_CLINIC_OR_DEPARTMENT_OTHER): Payer: Medicaid Other

## 2022-01-07 ENCOUNTER — Institutional Professional Consult (permissible substitution): Payer: Medicaid Other | Admitting: Cardiology

## 2022-01-07 ENCOUNTER — Encounter: Payer: Self-pay | Admitting: Internal Medicine

## 2022-01-07 ENCOUNTER — Inpatient Hospital Stay: Payer: Medicaid Other

## 2022-01-07 DIAGNOSIS — R680 Hypothermia, not associated with low environmental temperature: Secondary | ICD-10-CM | POA: Diagnosis present

## 2022-01-07 DIAGNOSIS — D649 Anemia, unspecified: Secondary | ICD-10-CM | POA: Diagnosis not present

## 2022-01-07 DIAGNOSIS — K219 Gastro-esophageal reflux disease without esophagitis: Secondary | ICD-10-CM | POA: Diagnosis not present

## 2022-01-07 DIAGNOSIS — A419 Sepsis, unspecified organism: Secondary | ICD-10-CM

## 2022-01-07 DIAGNOSIS — Y95 Nosocomial condition: Secondary | ICD-10-CM | POA: Diagnosis present

## 2022-01-07 DIAGNOSIS — R079 Chest pain, unspecified: Secondary | ICD-10-CM | POA: Diagnosis not present

## 2022-01-07 DIAGNOSIS — N138 Other obstructive and reflux uropathy: Secondary | ICD-10-CM | POA: Diagnosis not present

## 2022-01-07 DIAGNOSIS — Z8673 Personal history of transient ischemic attack (TIA), and cerebral infarction without residual deficits: Secondary | ICD-10-CM | POA: Diagnosis not present

## 2022-01-07 DIAGNOSIS — I452 Bifascicular block: Secondary | ICD-10-CM | POA: Diagnosis present

## 2022-01-07 DIAGNOSIS — I441 Atrioventricular block, second degree: Secondary | ICD-10-CM | POA: Diagnosis present

## 2022-01-07 DIAGNOSIS — G459 Transient cerebral ischemic attack, unspecified: Secondary | ICD-10-CM

## 2022-01-07 DIAGNOSIS — R471 Dysarthria and anarthria: Secondary | ICD-10-CM | POA: Diagnosis not present

## 2022-01-07 DIAGNOSIS — E1322 Other specified diabetes mellitus with diabetic chronic kidney disease: Secondary | ICD-10-CM | POA: Diagnosis present

## 2022-01-07 DIAGNOSIS — R0789 Other chest pain: Secondary | ICD-10-CM | POA: Diagnosis present

## 2022-01-07 DIAGNOSIS — E785 Hyperlipidemia, unspecified: Secondary | ICD-10-CM | POA: Diagnosis not present

## 2022-01-07 DIAGNOSIS — I25110 Atherosclerotic heart disease of native coronary artery with unstable angina pectoris: Secondary | ICD-10-CM | POA: Diagnosis present

## 2022-01-07 DIAGNOSIS — D709 Neutropenia, unspecified: Secondary | ICD-10-CM | POA: Diagnosis present

## 2022-01-07 DIAGNOSIS — T68XXXA Hypothermia, initial encounter: Secondary | ICD-10-CM | POA: Diagnosis not present

## 2022-01-07 DIAGNOSIS — J189 Pneumonia, unspecified organism: Secondary | ICD-10-CM

## 2022-01-07 DIAGNOSIS — E7800 Pure hypercholesterolemia, unspecified: Secondary | ICD-10-CM | POA: Diagnosis present

## 2022-01-07 DIAGNOSIS — I69322 Dysarthria following cerebral infarction: Secondary | ICD-10-CM | POA: Diagnosis not present

## 2022-01-07 DIAGNOSIS — I739 Peripheral vascular disease, unspecified: Secondary | ICD-10-CM | POA: Diagnosis present

## 2022-01-07 DIAGNOSIS — N1832 Chronic kidney disease, stage 3b: Secondary | ICD-10-CM | POA: Diagnosis present

## 2022-01-07 DIAGNOSIS — N401 Enlarged prostate with lower urinary tract symptoms: Secondary | ICD-10-CM | POA: Diagnosis not present

## 2022-01-07 DIAGNOSIS — Z823 Family history of stroke: Secondary | ICD-10-CM | POA: Diagnosis not present

## 2022-01-07 DIAGNOSIS — D509 Iron deficiency anemia, unspecified: Secondary | ICD-10-CM | POA: Diagnosis present

## 2022-01-07 DIAGNOSIS — N35911 Unspecified urethral stricture, male, meatal: Secondary | ICD-10-CM | POA: Diagnosis not present

## 2022-01-07 DIAGNOSIS — N179 Acute kidney failure, unspecified: Secondary | ICD-10-CM | POA: Diagnosis not present

## 2022-01-07 DIAGNOSIS — N400 Enlarged prostate without lower urinary tract symptoms: Secondary | ICD-10-CM

## 2022-01-07 DIAGNOSIS — R001 Bradycardia, unspecified: Secondary | ICD-10-CM | POA: Diagnosis present

## 2022-01-07 DIAGNOSIS — I129 Hypertensive chronic kidney disease with stage 1 through stage 4 chronic kidney disease, or unspecified chronic kidney disease: Secondary | ICD-10-CM | POA: Diagnosis present

## 2022-01-07 DIAGNOSIS — R338 Other retention of urine: Secondary | ICD-10-CM | POA: Diagnosis not present

## 2022-01-07 DIAGNOSIS — Z8249 Family history of ischemic heart disease and other diseases of the circulatory system: Secondary | ICD-10-CM | POA: Diagnosis not present

## 2022-01-07 DIAGNOSIS — I69354 Hemiplegia and hemiparesis following cerebral infarction affecting left non-dominant side: Secondary | ICD-10-CM | POA: Diagnosis not present

## 2022-01-07 DIAGNOSIS — R339 Retention of urine, unspecified: Secondary | ICD-10-CM | POA: Diagnosis not present

## 2022-01-07 DIAGNOSIS — R6520 Severe sepsis without septic shock: Secondary | ICD-10-CM | POA: Diagnosis not present

## 2022-01-07 DIAGNOSIS — J9811 Atelectasis: Secondary | ICD-10-CM | POA: Diagnosis present

## 2022-01-07 DIAGNOSIS — I480 Paroxysmal atrial fibrillation: Secondary | ICD-10-CM | POA: Diagnosis not present

## 2022-01-07 DIAGNOSIS — D631 Anemia in chronic kidney disease: Secondary | ICD-10-CM | POA: Diagnosis present

## 2022-01-07 LAB — LIPID PANEL
Cholesterol: 130 mg/dL (ref 0–200)
HDL: 59 mg/dL (ref >40)
LDL Cholesterol: 35 mg/dL (ref 0–99)
Total CHOL/HDL Ratio: 2.2 RATIO
Triglycerides: 181 mg/dL — ABNORMAL HIGH (ref <150)
VLDL: 36 mg/dL (ref 0–40)

## 2022-01-07 LAB — CBC WITH DIFFERENTIAL/PLATELET
Basophils Absolute: 0 10*3/uL (ref 0.0–0.2)
Basos: 0 %
EOS (ABSOLUTE): 0 10*3/uL (ref 0.0–0.4)
Eos: 2 %
Hematocrit: 26.9 % — ABNORMAL LOW (ref 37.5–51.0)
Hemoglobin: 9 g/dL — ABNORMAL LOW (ref 13.0–17.7)
Immature Grans (Abs): 0 10*3/uL (ref 0.0–0.1)
Immature Granulocytes: 1 %
Lymphocytes Absolute: 1.1 10*3/uL (ref 0.7–3.1)
Lymphs: 43 %
MCH: 28.3 pg (ref 26.6–33.0)
MCHC: 33.5 g/dL (ref 31.5–35.7)
MCV: 85 fL (ref 79–97)
Monocytes Absolute: 0.2 10*3/uL (ref 0.1–0.9)
Monocytes: 8 %
NRBC: 1 % — ABNORMAL HIGH (ref 0–0)
Neutrophils Absolute: 1.2 10*3/uL — ABNORMAL LOW (ref 1.4–7.0)
Neutrophils: 46 %
Platelets: 206 10*3/uL (ref 150–450)
RBC: 3.18 x10E6/uL — ABNORMAL LOW (ref 4.14–5.80)
RDW: 15.8 % — ABNORMAL HIGH (ref 11.6–15.4)
WBC: 2.6 10*3/uL — ABNORMAL LOW (ref 3.4–10.8)

## 2022-01-07 LAB — BASIC METABOLIC PANEL
Anion gap: 5 (ref 5–15)
BUN: 25 mg/dL — ABNORMAL HIGH (ref 8–23)
CO2: 23 mmol/L (ref 22–32)
Calcium: 8.5 mg/dL — ABNORMAL LOW (ref 8.9–10.3)
Chloride: 113 mmol/L — ABNORMAL HIGH (ref 98–111)
Creatinine, Ser: 1.57 mg/dL — ABNORMAL HIGH (ref 0.61–1.24)
GFR, Estimated: 49 mL/min — ABNORMAL LOW (ref >60)
Glucose, Bld: 97 mg/dL (ref 70–99)
Potassium: 3.9 mmol/L (ref 3.5–5.1)
Sodium: 141 mmol/L (ref 135–145)

## 2022-01-07 LAB — HEPATIC FUNCTION PANEL
ALT: 77 U/L — ABNORMAL HIGH (ref 0–44)
AST: 41 U/L (ref 15–41)
Albumin: 3.4 g/dL — ABNORMAL LOW (ref 3.5–5.0)
Alkaline Phosphatase: 140 U/L — ABNORMAL HIGH (ref 38–126)
Bilirubin, Direct: <0.1 mg/dL (ref 0.0–0.2)
Total Bilirubin: 0.4 mg/dL (ref 0.3–1.2)
Total Protein: 7.1 g/dL (ref 6.5–8.1)

## 2022-01-07 LAB — LACTIC ACID, PLASMA
Lactic Acid, Venous: 0.8 mmol/L (ref 0.5–1.9)
Lactic Acid, Venous: 1.4 mmol/L (ref 0.5–1.9)

## 2022-01-07 LAB — URINALYSIS, COMPLETE (UACMP) WITH MICROSCOPIC
Bilirubin Urine: NEGATIVE
Glucose, UA: >=500 mg/dL — AB
Ketones, ur: NEGATIVE mg/dL
Leukocytes,Ua: NEGATIVE
Nitrite: NEGATIVE
Protein, ur: 100 mg/dL — AB
Specific Gravity, Urine: 1.005 (ref 1.005–1.030)
pH: 5 (ref 5.0–8.0)

## 2022-01-07 LAB — COMPREHENSIVE METABOLIC PANEL
ALT: 77 IU/L — ABNORMAL HIGH (ref 0–44)
AST: 37 IU/L (ref 0–40)
Albumin/Globulin Ratio: 1.5 (ref 1.2–2.2)
Albumin: 4 g/dL (ref 3.8–4.8)
Alkaline Phosphatase: 181 IU/L — ABNORMAL HIGH (ref 44–121)
BUN/Creatinine Ratio: 15 (ref 10–24)
BUN: 22 mg/dL (ref 8–27)
Bilirubin Total: <0.2 mg/dL (ref 0.0–1.2)
CO2: 21 mmol/L (ref 20–29)
Calcium: 8.7 mg/dL (ref 8.6–10.2)
Chloride: 104 mmol/L (ref 96–106)
Creatinine, Ser: 1.48 mg/dL — ABNORMAL HIGH (ref 0.76–1.27)
Globulin, Total: 2.7 g/dL (ref 1.5–4.5)
Glucose: 112 mg/dL — ABNORMAL HIGH (ref 70–99)
Potassium: 3.9 mmol/L (ref 3.5–5.2)
Sodium: 138 mmol/L (ref 134–144)
Total Protein: 6.7 g/dL (ref 6.0–8.5)
eGFR: 53 mL/min/{1.73_m2} — ABNORMAL LOW (ref >59)

## 2022-01-07 LAB — TROPONIN I (HIGH SENSITIVITY): Troponin I (High Sensitivity): 8 ng/L (ref <18)

## 2022-01-07 LAB — PROTIME-INR
INR: 1.2 (ref 0.8–1.2)
Prothrombin Time: 14.6 s (ref 11.4–15.2)

## 2022-01-07 LAB — GLUCOSE, CAPILLARY
Glucose-Capillary: 91 mg/dL (ref 70–99)
Glucose-Capillary: 81 mg/dL (ref 70–99)
Glucose-Capillary: 73 mg/dL (ref 70–99)
Glucose-Capillary: 84 mg/dL (ref 70–99)
Glucose-Capillary: 161 mg/dL — ABNORMAL HIGH (ref 70–99)
Glucose-Capillary: 66 mg/dL — ABNORMAL LOW (ref 70–99)

## 2022-01-07 LAB — APTT: aPTT: 42 seconds — ABNORMAL HIGH (ref 24–36)

## 2022-01-07 LAB — CBC
HCT: 25.3 % — ABNORMAL LOW (ref 39.0–52.0)
Hemoglobin: 7.9 g/dL — ABNORMAL LOW (ref 13.0–17.0)
MCH: 27.5 pg (ref 26.0–34.0)
MCHC: 31.2 g/dL (ref 30.0–36.0)
MCV: 88.2 fL (ref 80.0–100.0)
Platelets: 181 10*3/uL (ref 150–400)
RBC: 2.87 MIL/uL — ABNORMAL LOW (ref 4.22–5.81)
RDW: 17.1 % — ABNORMAL HIGH (ref 11.5–15.5)
WBC: 3.7 10*3/uL — ABNORMAL LOW (ref 4.0–10.5)
nRBC: 0.5 % — ABNORMAL HIGH (ref 0.0–0.2)

## 2022-01-07 LAB — CBG MONITORING, ED: Glucose-Capillary: 103 mg/dL — ABNORMAL HIGH (ref 70–99)

## 2022-01-07 LAB — MRSA NEXT GEN BY PCR, NASAL: MRSA by PCR Next Gen: NOT DETECTED

## 2022-01-07 MED ORDER — SODIUM CHLORIDE 0.9 % IV SOLN
2.0000 g | INTRAVENOUS | Status: DC
  Administered 2022-01-07 – 2022-01-09 (×3): 2 g via INTRAVENOUS
  Filled 2022-01-07 (×2): qty 2
  Filled 2022-01-07: qty 20
  Filled 2022-01-07: qty 2

## 2022-01-07 MED ORDER — TAMSULOSIN HCL 0.4 MG PO CAPS
0.4000 mg | ORAL_CAPSULE | Freq: Every day | ORAL | Status: DC
  Administered 2022-01-07 – 2022-01-11 (×4): 0.4 mg via ORAL
  Filled 2022-01-07 (×5): qty 1

## 2022-01-07 MED ORDER — TRAZODONE HCL 50 MG PO TABS
25.0000 mg | ORAL_TABLET | Freq: Every evening | ORAL | Status: DC | PRN
  Administered 2022-01-07: 25 mg via ORAL
  Filled 2022-01-07: qty 1

## 2022-01-07 MED ORDER — STROKE: EARLY STAGES OF RECOVERY BOOK: Freq: Once | Status: DC

## 2022-01-07 MED ORDER — POTASSIUM CHLORIDE IN NACL 20-0.9 MEQ/L-% IV SOLN
INTRAVENOUS | Status: DC
  Filled 2022-01-07 (×4): qty 1000

## 2022-01-07 MED ORDER — CHLORHEXIDINE GLUCONATE CLOTH 2 % EX PADS
6.0000 | MEDICATED_PAD | Freq: Every day | CUTANEOUS | Status: DC
  Administered 2022-01-08 – 2022-01-10 (×3): 6 via TOPICAL

## 2022-01-07 MED ORDER — MIRTAZAPINE 15 MG PO TABS
7.5000 mg | ORAL_TABLET | Freq: Every day | ORAL | Status: DC
  Administered 2022-01-07 – 2022-01-10 (×4): 7.5 mg via ORAL
  Filled 2022-01-07 (×4): qty 1

## 2022-01-07 MED ORDER — SODIUM CHLORIDE 0.9 % IV SOLN
500.0000 mg | INTRAVENOUS | Status: DC
  Administered 2022-01-08: 500 mg via INTRAVENOUS
  Filled 2022-01-07 (×2): qty 5
  Filled 2022-01-07: qty 500

## 2022-01-07 MED ORDER — MAGNESIUM HYDROXIDE 400 MG/5ML PO SUSP: 30.0000 mL | Freq: Every day | ORAL | Status: DC | PRN

## 2022-01-07 MED ORDER — GADOBUTROL 1 MMOL/ML IV SOLN
10.0000 mL | Freq: Once | INTRAVENOUS | Status: AC | PRN
  Administered 2022-01-07: 10 mL via INTRAVENOUS

## 2022-01-07 MED ORDER — LACTATED RINGERS IV BOLUS (SEPSIS)
1000.0000 mL | Freq: Once | INTRAVENOUS | Status: AC
  Administered 2022-01-07: 1000 mL via INTRAVENOUS

## 2022-01-07 MED ORDER — VANCOMYCIN HCL IN DEXTROSE 1-5 GM/200ML-% IV SOLN: 1000.0000 mg | Freq: Once | INTRAVENOUS | Status: DC

## 2022-01-07 MED ORDER — MORPHINE SULFATE (PF) 2 MG/ML IV SOLN
2.0000 mg | INTRAVENOUS | Status: DC | PRN
  Administered 2022-01-07: 2 mg via INTRAVENOUS
  Filled 2022-01-07: qty 1

## 2022-01-07 MED ORDER — ONDANSETRON HCL 4 MG/2ML IJ SOLN: 4.0000 mg | Freq: Four times a day (QID) | INTRAMUSCULAR | Status: DC | PRN

## 2022-01-07 MED ORDER — ATROPINE SULFATE 1 MG/10ML IJ SOSY
0.5000 mg | PREFILLED_SYRINGE | Freq: Once | INTRAMUSCULAR | Status: AC
  Administered 2022-01-07: 0.5 mg via INTRAVENOUS
  Filled 2022-01-07: qty 10

## 2022-01-07 MED ORDER — PANTOPRAZOLE SODIUM 40 MG PO TBEC
80.0000 mg | DELAYED_RELEASE_TABLET | Freq: Every day | ORAL | Status: DC
  Administered 2022-01-07 – 2022-01-11 (×4): 80 mg via ORAL
  Filled 2022-01-07 (×5): qty 2

## 2022-01-07 MED ORDER — ASPIRIN 81 MG PO TBEC: 81.0000 mg | DELAYED_RELEASE_TABLET | Freq: Every day | ORAL | Status: DC

## 2022-01-07 MED ORDER — NITROGLYCERIN 0.4 MG SL SUBL
0.4000 mg | SUBLINGUAL_TABLET | SUBLINGUAL | Status: DC | PRN
  Administered 2022-01-07: 0.4 mg via SUBLINGUAL
  Filled 2022-01-07: qty 1

## 2022-01-07 MED ORDER — ALBUTEROL SULFATE (2.5 MG/3ML) 0.083% IN NEBU: 2.5000 mg | INHALATION_SOLUTION | Freq: Four times a day (QID) | RESPIRATORY_TRACT | Status: DC | PRN

## 2022-01-07 MED ORDER — APIXABAN 5 MG PO TABS
5.0000 mg | ORAL_TABLET | Freq: Two times a day (BID) | ORAL | Status: DC
  Administered 2022-01-07 (×2): 5 mg via ORAL
  Filled 2022-01-07 (×2): qty 1

## 2022-01-07 MED ORDER — ONDANSETRON HCL 4 MG PO TABS: 4.0000 mg | ORAL_TABLET | Freq: Four times a day (QID) | ORAL | Status: DC | PRN

## 2022-01-07 MED ORDER — AMLODIPINE BESYLATE 10 MG PO TABS: 10.0000 mg | ORAL_TABLET | Freq: Every day | ORAL | Status: DC

## 2022-01-07 MED ORDER — ACETAMINOPHEN 650 MG RE SUPP: 650.0000 mg | Freq: Four times a day (QID) | RECTAL | Status: DC | PRN

## 2022-01-07 MED ORDER — VANCOMYCIN HCL 1500 MG/300ML IV SOLN
1500.0000 mg | Freq: Once | INTRAVENOUS | Status: DC
  Filled 2022-01-07 (×3): qty 300

## 2022-01-07 MED ORDER — ACETAMINOPHEN 325 MG PO TABS: 650.0000 mg | ORAL_TABLET | Freq: Four times a day (QID) | ORAL | Status: DC | PRN

## 2022-01-07 MED ORDER — ATORVASTATIN CALCIUM 20 MG PO TABS
80.0000 mg | ORAL_TABLET | Freq: Every evening | ORAL | Status: DC
  Administered 2022-01-07 – 2022-01-09 (×2): 80 mg via ORAL
  Filled 2022-01-07 (×2): qty 4

## 2022-01-07 MED ORDER — DAPAGLIFLOZIN PROPANEDIOL 10 MG PO TABS
10.0000 mg | ORAL_TABLET | Freq: Every day | ORAL | Status: DC
  Administered 2022-01-07 – 2022-01-11 (×4): 10 mg via ORAL
  Filled 2022-01-07 (×5): qty 1

## 2022-01-07 MED ORDER — SODIUM CHLORIDE 0.9 % IV SOLN
2.0000 g | Freq: Once | INTRAVENOUS | Status: AC
  Administered 2022-01-07: 2 g via INTRAVENOUS
  Filled 2022-01-07: qty 12.5

## 2022-01-07 NOTE — Progress Notes (Signed)
       CROSS COVER NOTE  NAME: GLYNN YEPES Sr. MRN: [id number] DOB : 02-13-1956    Date of Service   01/07/22  HPI/Events of Note   Mr Mitton was transferred to ICU from 2A for severe hypothermia and persistent bradycardia since arrival to [facility] ED requiring atropine x1 in ED. On arrival to the ICU, staff requested I come evaluate Mr Bubb for reports of chest pain.   Significant Hypothermia Temp 32.9C Bradycardia HR 37 RR 8 BP 148/84  He reports central chest pressure 8/10. He denies dyspnea, palpitations, nausea, and vomiting. No diaphoresis on exam.   Troponin negative and flat x2 last night EKG last night without ischemic changes.  Interventions   Plan: SL Nitro PRN Morphine       Bishop Limbo DNP, MHA, FNP-BC Nurse Practitioner [facility] Pager [phone]

## 2022-01-07 NOTE — Assessment & Plan Note (Addendum)
-  continue PPI therapy

## 2022-01-07 NOTE — Sepsis Progress Note (Signed)
Following per sepsis protocol   

## 2022-01-07 NOTE — H&P (Signed)
Evan Townsend   PATIENT NAME: Evan Townsend    MR#:  [id number]  DATE OF BIRTH:  Feb 02, 1956  DATE OF ADMISSION:  01/06/2022  PRIMARY CARE PHYSICIAN: Evan Cousins, MD   Patient is coming from: Home  REQUESTING/REFERRING PHYSICIAN: Hinda Kehr, MD  CHIEF COMPLAINT:   Chief Complaint  Patient presents with   Chest Pain    HISTORY OF PRESENT ILLNESS:  Evan Townsend Sr. is a 66 y.o. 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  He denies any significant cough however admits to mild dyspnea with this chest pain and felt cold.  He admitted to diarrhea without melena or bright red blood per rectum.  No dysuria, oliguria or hematuria or flank pain.  He has been feeling generally weak and fatigued.  He was bradycardic in the ER with heart rate in the upper 30s and lower 40s.  It would come up to the 50s when he is awake.  ED Course: When he came to the ER initial heart rate was 51 and later 45 with respirate of 12 later 25.  He was hypothermic with a temperature that was as low as 90.7 initially and later 89.6.  He was ordered [facility].  Labs revealed a BUN of 25 and creatinine 1.65 close to baseline with borderline potassium at 3.5 and LFTs with albumin 3.4 and ALT of 77 below previous levels and lactic acid was 0.8 only 1.4.  CBC showed leukopenia of 3 with anemia close to baseline and PTT was 42 with INR 1.2 and PT of 14.6. EKG as reviewed by me : Initial EKG showed sinus rhythm with a first-degree AV block with a rate of 62 with right bundle branch block. Imaging: Two-view chest  x-ray showed signs of atelectasis with more confluence area of consolidation at the left lung base may represent volume loss or developing pneumonia. Chest CT without contrast revealed mild right basal and left lower linear atelectasis and/or infiltrate with mildly increased severity compared to prior study.  Showed mild cardiomegaly with moderate severity coronary artery calcification.  Noncontrasted head CT scan revealed generalized cerebral atrophy with chronic white matter small vessel ischemic changes with no acute ventricular abnormality.  The patient was given 2 g of IV cefepime and 2 L bolus of IV lactated Ringer.  He was admitted to a progressive unit bed and given persistent bradycardia is going to a stepdown unit bed. PAST MEDICAL HISTORY:   Past Medical History:  Diagnosis Date   Carotid arterial disease ([place])    a. 08/2018 Carotid U/S: min-mod RICA atherosclerosis w/o hemodynamically significant stenosis. Nl LICA.   Diabetes 1.5, managed as type 2 ([place])    Diastolic dysfunction    a. 08/2018 Echo: EF 65%. No rwma. Gr1 DD. Mild MR.   Diastolic dysfunction    a. 08/2019 Echo: EF 55-60%, Gr1 DD. No rwma. Mild MR. RVSP 37.31mHg.   Hypercholesterolemia    Hypertension    PAF (paroxysmal atrial fibrillation) ([place]    a. 10/2019 Event Monitor: PAF; b. CHA2DS2VASc = 5-->Eliquis.   Poorly controlled diabetes mellitus ([place]    a. 04/2019 A1c 13.8.   Recurrent strokes ([place]  a. 10/2016 MRI/A: Acute 39m R thalamic infarct, ? subacute infarct of R corona radiata; b. 08/2017 MRI/A: Acute 863mlateral L thalamic infarct. Other more remote lacunar infarcts of thalami bilat. Small vessel dzs; c. 08/2018 MRI/A: Acute lacunar infarct of the post limb of R internal capsule; d. 08/2019 MRI Acute CVA of L paramedian pons adn R cerebellar hemisphere.   Tobacco abuse     PAST SURGICAL HISTORY:   Past Surgical History:  Procedure Laterality Date   ESOPHAGOGASTRODUODENOSCOPY (EGD) WITH PROPOFOL N/A  04/26/2019   Procedure: ESOPHAGOGASTRODUODENOSCOPY (EGD) WITH PROPOFOL;  Surgeon: VaLin LandsmanMD;  Location: [facility] ENDOSCOPY;  Service: Gastroenterology;  Laterality: N/A;   NO PAST SURGERIES      SOCIAL HISTORY:   Social History   Tobacco Use   Smoking status: Former    Packs/day: 1.00    Years: 38.00    Pack years: 38.00    Types: Cigarettes    Start date: 1982   Smokeless tobacco: Never  Substance Use Topics   Alcohol use: Not Currently    Alcohol/week: 1.0 standard drink    Types: 1 Cans of beer per week    Comment: previously drank but nothing in 1-2 yrs (08/2019).    FAMILY HISTORY:   Family History  Problem Relation Age of Onset   Hypertension Mother    Stroke Mother        died @ age 163 Hypertension Father    Diabetes Father    Heart attack Father        died @ 5558  DRUG ALLERGIES:  No Known Allergies  REVIEW OF SYSTEMS:   ROS As per history of present illness. All pertinent systems were reviewed above. Constitutional, HEENT, cardiovascular, respiratory, GI, GU, musculoskeletal, neuro, psychiatric, endocrine, integumentary and hematologic systems were reviewed and are otherwise negative/unremarkable except for positive findings mentioned above in the HPI.   MEDICATIONS AT HOME:   Prior to Admission medications   Medication Sig Start Date End Date Taking? Authorizing Provider  Accu-Chek Softclix Lancets lancets SMARTSIG:Topical 12/24/20  Yes Sowles, KrDrue StagerMD  albuterol (VENTOLIN HFA) 108 (90 Base) MCG/ACT inhaler Inhale 2 puffs into the lungs every 6 (six) hours as needed for wheezing or shortness of breath. 02/26/21  Yes Vigg, Avanti, MD  amLODipine (NORVASC) 10 MG tablet Take 1 tablet (10 mg total) by mouth daily. 06/27/21  Yes Vigg, Avanti, MD  apixaban (ELIQUIS) 5 MG TABS tablet Take 1 tablet (5 mg total) by mouth 2 (two) times daily. 10/18/21  Yes Wieting, Richard, MD  atorvastatin (LIPITOR) 80 MG tablet Take 1 tablet (80 mg total) by mouth  daily. Patient taking differently: Take 80 mg by mouth every evening. 03/20/21  Yes Vigg, Avanti, MD  Blood Glucose Monitoring Suppl KIT Accucheck Guide with lancets and test strips #100 with one refill.  Test blood sugar twice daily. DX Code E11.65 ; Z79.4 07/09/20  Yes HeTowanda MalkinMD  Continuous Blood Gluc Sensor (FREESTYLE LIBRE 2 SENSOR) MISC 1 each by Does not apply route every 14 (fourteen) days. 06/20/21  Yes Vigg, Avanti, MD  dapagliflozin propanediol (FARXIGA) 10 MG TABS tablet Take 1 tablet (10 mg total) by mouth daily before breakfast. In place of glipizide 01/01/22  Yes Vigg, Avanti, MD  Incontinence Supply Disposable (COMFORT PROTECT ADULT DIAPER/L) MISC 1 Units by Does not apply route daily. Use 3-4 a day as needed 06/20/21  Yes Vigg, Avanti, MD  Insulin Pen Needle 32G X 6 MM  MISC Daily 10/24/20  Yes Sowles, Drue Stager, MD  metoCLOPramide (REGLAN) 5 MG tablet TAKE 1 TABLET BY MOUTH 3 TIMES DAILY BEFORE MEALS 12/25/21  Yes Vigg, Avanti, MD  mirtazapine (REMERON) 7.5 MG tablet Take 1 tablet (7.5 mg total) by mouth at bedtime. 01/01/22  Yes Vigg, Avanti, MD  Misc. Devices MISC One pair of Compression stockings  Hemiplegia and hemiparesis following cerebral infarction affecting left non-dominant side ([place])  - Primary Codes: W23.762 Lower extremity edema  Codes: R60.0 Type 2 diabetes mellitus with hyperglycemia, with long-term current use of insulin ([place])  Codes: E11.65, Z79.4 05-29-20  Yes Towanda Malkin, MD  omeprazole (PRILOSEC) 40 MG capsule TAKE 1 CAPSULE BY MOUTH IN THE MORNING AND AT BEDTIME Patient taking differently: Take 40 mg by mouth in the morning and at bedtime. 08/05/21  Yes Vigg, Avanti, MD  tamsulosin (FLOMAX) 0.4 MG CAPS capsule Take 1 capsule (0.4 mg total) by mouth daily. 10/18/21  Yes Loletha Grayer, MD  ACCU-CHEK GUIDE test strip Check fsbs once daily 12/24/20   Steele Sizer, MD  nitroGLYCERIN (NITROSTAT) 0.4 MG SL tablet Place 1 tablet (0.4 mg total)  under the tongue every 5 (five) minutes as needed for chest pain. 09/16/21   Emeterio Reeve, DO      VITAL SIGNS:  Blood pressure (!) 149/82, pulse (!) 42, temperature (!) 90.7 F (32.6 C), temperature source Rectal, resp. rate (!) 9, height 5' 6"  (1.676 m), weight 67.5 kg, SpO2 100 %.  PHYSICAL EXAMINATION:  Physical Exam  GENERAL:  66 y.o.-year-old Afro-American male patient lying in the bed with no acute distress.  EYES: Pupils equal, round, reactive to light and accommodation. No scleral icterus. Extraocular muscles intact.  HEENT: Head atraumatic, normocephalic. Oropharynx and nasopharynx clear.  NECK:  Supple, no jugular venous distention. No thyroid enlargement, no tenderness.  LUNGS: Diminishedbreath sounds with left basal crackles slight diminished right basal breath sounds.. No use of accessory muscles of respiration.  CARDIOVASCULAR: Regular rate and rhythm, S1, S2 normal. No murmurs, rubs, or gallops.  ABDOMEN: Soft, nondistended, nontender. Bowel sounds present. No organomegaly or mass.  EXTREMITIES: No pedal edema, cyanosis, or clubbing.  NEUROLOGIC: Cranial nerves II through XII are intact. Muscle strength 5/5 in all extremities. Sensation intact. Gait not checked.  PSYCHIATRIC: The patient is alert and oriented x 3.  Normal affect and good eye contact. SKIN: No obvious rash, lesion, or ulcer.   LABORATORY PANEL:   CBC Recent Labs  Lab 01/07/22 0321  WBC 3.7*  HGB 7.9*  HCT 25.3*  PLT 181   ------------------------------------------------------------------------------------------------------------------  Chemistries  Recent Labs  Lab 01/06/22 2015 01/07/22 0321  NA  --  141  K  --  3.9  CL  --  113*  CO2  --  23  GLUCOSE  --  97  BUN  --  25*  CREATININE  --  1.57*  CALCIUM  --  8.5*  AST 41  --   ALT 77*  --   ALKPHOS 140*  --   BILITOT 0.4  --     ------------------------------------------------------------------------------------------------------------------  Cardiac Enzymes No results for input(s): TROPONINI in the last 168 hours. ------------------------------------------------------------------------------------------------------------------  RADIOLOGY:  DG Chest 2 View  Result Date: 01/06/2022 CLINICAL DATA:  A 66 year old male presents for evaluation of chest pain. EXAM: CHEST - 2 VIEW COMPARISON:  Dec 28, 2021. FINDINGS: Trachea midline. Mild cardiac enlargement, accentuated by portable technique and AP projection. Patchy linear basilar airspace disease without lobar consolidation. No pneumothorax. On lateral projection  airspace disease appears more confluent likely in the LEFT lung base. On limited assessment no acute skeletal findings. IMPRESSION: Signs of atelectasis but with more confluent area of consolidation at the LEFT lung base that may represent volume loss or developing pneumonia. Electronically Signed   By: Zetta Bills M.D.   On: 01/06/2022 16:45   CT Head Wo Contrast  Result Date: 01/07/2022 CLINICAL DATA:  Chest pain under the left nipple since last night which radiates to the left neck. EXAM: CT HEAD WITHOUT CONTRAST TECHNIQUE: Contiguous axial images were obtained from the base of the skull through the vertex without intravenous contrast. RADIATION DOSE REDUCTION: This exam was performed according to the departmental dose-optimization program which includes automated exposure control, adjustment of the mA and/or kV according to patient size and/or use of iterative reconstruction technique. COMPARISON:  Dec 28, 2021 FINDINGS: Brain: There is mild cerebral atrophy with widening of the extra-axial spaces and ventricular dilatation. There are areas of decreased attenuation within the white matter tracts of the supratentorial brain, consistent with microvascular disease changes. Vascular: No hyperdense vessel or  unexpected calcification. Skull: Normal. Negative for fracture or focal lesion. Sinuses/Orbits: There is mild left maxillary sinus mucosal thickening Other: None. IMPRESSION: 1. No acute intracranial abnormality. 2. Generalized cerebral atrophy with chronic white matter small vessel ischemic changes. Electronically Signed   By: Virgina Norfolk M.D.   On: 01/07/2022 00:08   CT Chest Wo Contrast  Result Date: 01/07/2022 CLINICAL DATA:  Chest pain. EXAM: CT CHEST WITHOUT CONTRAST TECHNIQUE: Multidetector CT imaging of the chest was performed following the standard protocol without IV contrast. RADIATION DOSE REDUCTION: This exam was performed according to the departmental dose-optimization program which includes automated exposure control, adjustment of the mA and/or kV according to patient size and/or use of iterative reconstruction technique. COMPARISON:  September 14, 2021 FINDINGS: Cardiovascular: No significant vascular findings. There is mild cardiomegaly with moderate severity coronary artery calcification. A trace amount of pericardial fluid is noted. Mediastinum/Nodes: No enlarged mediastinal or axillary lymph nodes. Thyroid gland, trachea, and esophagus demonstrate no significant findings. Lungs/Pleura: Mild posteromedial right basilar and left lower lobe linear scarring, atelectasis and/or infiltrate is seen. This is mildly increased in severity when compared to the prior study. There is no evidence of a pleural effusion or pneumothorax Upper Abdomen: No acute abnormality. Musculoskeletal: No chest wall mass or suspicious bone lesions identified. IMPRESSION: 1. Mild right basilar and left lower lobe linear scarring, atelectasis and/or infiltrate. This is mildly increased in severity when compared to the prior study. 2. Mild cardiomegaly with moderate severity coronary artery calcification. Electronically Signed   By: Virgina Norfolk M.D.   On: 01/07/2022 00:12      IMPRESSION AND PLAN:  Assessment  and Plan: * Symptomatic bradycardia - He has chest pain which could be associated with symptomatic bradycardia. - We will need to rule out ACS. - We will follow serial troponins. - We will manage bradycardia with as needed IV atropine. - While the patient is in the ICU I may start him on IV dopamine should bradycardia persists. - Cardiology consult will be obtained. - I notified Dr. Harrell Gave with [facility] about the patient.  Sepsis due to pneumonia [facility]) - This is manifested mild leukopenia and hypothermia and he is associated bradycardia. - Given has neutropenia we will place him on IV cefepime and Zithromax and add vancomycin being a MRSA risk. - We will follow blood cultures. - She will be on mucolytic's and bronchodilator therapy as  needed. - She will be hydrated with IV normal saline. - His hyperal thermia will be managed  Hypertension - We will avoid any calcium channel blocker including dihydropyridine's given her significant bradycardia. - We will place him on as needed IV hydralazine.  Dyslipidemia - We will continue statin therapy.  Paroxysmal atrial fibrillation (HCC) - We will continue his Eliquis.  GERD without esophagitis - We will continue PPI therapy  BPH (benign prostatic hyperplasia) - We will continue his Flomax.   DVT prophylaxis: Eliquis.   Advanced Care Planning:  Code Status: full code. Family Communication:  The plan of care was discussed in details with the patient (and family). I answered all questions. The patient agreed to proceed with the above mentioned plan. Further management will depend upon hospital course. Disposition Plan: Back to previous home environment Consults called: Cardiology. All the records are reviewed and case discussed with ED provider.  Status is: Inpatient  At the time of the admission, it appears that the appropriate admission status for this patient is inpatient.  This is judged to be reasonable and necessary in order to  provide the required intensity of service to ensure the patient's safety given the presenting symptoms, physical exam findings and initial radiographic and laboratory data in the context of comorbid conditions.  The patient requires inpatient status due to high intensity of service, high risk of further deterioration and high frequency of surveillance required.  I certify that at the time of admission, it is my clinical judgment that the patient will require inpatient hospital care extending more than 2 midnights.                            Dispo: The patient is from: Home              Anticipated d/c is to: Home              Patient currently is not medically stable to d/c.              Difficult to place patient: No  Christel Mormon M.D on 01/07/2022 at 5:09 AM  [facility]   From 7 PM-7 AM, contact night-coverage [email or web address]  CC: Primary care physician; Evan Cousins, MD

## 2022-01-07 NOTE — Progress Notes (Signed)
Pt arrived to the floor. HR 42 temp 89. Dr Joylene Igo notfied as which time EKG, and warming blanket to be obtained.

## 2022-01-07 NOTE — Assessment & Plan Note (Addendum)
--  currently in NSR --transition back to home Eliquis

## 2022-01-07 NOTE — Evaluation (Signed)
Physical Therapy Evaluation Patient Details Name: Evan GETTER Sr. MRN: [id number] DOB: 1955/08/22 Today's Date: 01/07/2022  History of Present Illness  Pt is a 66 year old admitted with symptomatic bradycardia, sepsis due to penumonia following acute onset of mid sternal chest pain felt as pressure and graded 10/10 in severity with no nausea or vomiting or diaphoresis. 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  Pt participated in seated therapeutic exercises and able to stand following exercises needing modA with HHA of the R UE.  Pt with mild instability in standing, requiring stability on L effected side.  Pt did perform marches and 2 side-steps towards head of bed for transfer back to the bed.  Pt then assisted back into bed and continued with supine exercises.  Pt has had functional decline in mobility since last hospital visit and family notes that as well.    Family members are to bring [place] crutches to assist with transfers and increased stability for the pt.  Therapist recommending STR at SNF at this time in order to improve strength and functional mobility.       Recommendations for follow up therapy are one component of a multi-disciplinary discharge planning process, led by the attending physician.  Recommendations may be updated based on patient status, additional functional criteria and insurance authorization.  Follow Up Recommendations Skilled nursing-short term rehab (<3 hours/day)    Assistance Recommended at Discharge Frequent or constant Supervision/Assistance  Patient can return home with the following  Help with stairs or ramp for entrance;Assist for transportation;A lot of help  with walking and/or transfers;A lot of help with bathing/dressing/bathroom    Equipment Recommendations None recommended by PT  Recommendations for Other Services       Functional Status Assessment Patient has had a recent decline in their functional status and demonstrates the ability to make significant improvements in function in a reasonable and predictable amount of time.     Precautions / Restrictions Precautions Precautions: Fall Restrictions Weight Bearing Restrictions: No      Mobility  Bed Mobility Overal bed mobility: Needs Assistance Bed Mobility: Supine to Sit       Sit to supine: Mod assist   General bed mobility comments: modA for bringing legs back into bed from seated position.    Transfers Overall transfer level: Needs assistance Equipment used: Rolling walker (2 wheels) Transfers: Sit to/from Stand Sit to Stand: Mod assist           General transfer comment: HHA support in the R UE to assist coming upright.    Ambulation/Gait         Gait velocity: decreased     General Gait Details: deferred due to instability.  Stairs            Wheelchair Mobility    Modified Rankin (Stroke Patients Only)       Balance Overall balance assessment: Needs assistance Sitting-balance support: Feet supported Sitting balance-Leahy Scale: Fair   Postural control: Posterior lean Standing balance support: Bilateral upper extremity supported, Reliant on assistive device for balance Standing balance-Leahy Scale: Poor  Pertinent Vitals/Pain Pain Assessment Pain Assessment: No/denies pain    Home Living Family/patient expects to be discharged to:: Private residence Living Arrangements: Spouse/significant other Available Help at Discharge: Family;Available PRN/intermittently (had an aid for 3-4 hrs a day 6 days a week, wife with him SUndays) Type of Home: House Home Access: Stairs to enter Entrance  Stairs-Rails: Right;Left Entrance Stairs-Number of Steps: 3, ramp in the back pt uses   Home Layout: One level Home Equipment: Pharmacist, hospitalhower seat;Rolling Walker (2 wheels);Cane - single point (spc with platform)      Prior Function               Mobility Comments: was amb with loftstrand crutches, decreased tolerance and overall mobility since previous admissionper wife report ADLs Comments: since discharge assist for all ADL/IADL from wife, has HHA 6 days per week 3-4 hrs per day     Hand Dominance   Dominant Hand: Right    Extremity/Trunk Assessment   Upper Extremity Assessment Upper Extremity Assessment: LUE deficits/detail LUE Deficits / Details: chronic LUE weakness from prior CVA, RUE generalized weakness    Lower Extremity Assessment Lower Extremity Assessment: LLE deficits/detail LLE Deficits / Details: dorsiflexion 1/5, plantarflexion 2/5. chronic weakness from residual stroke deficits LLE Sensation: WNL LLE Coordination: decreased gross motor       Communication   Communication: Expressive difficulties  Cognition Arousal/Alertness: Awake/alert Behavior During Therapy: Flat affect Overall Cognitive Status: Impaired/Different from baseline Area of Impairment: Orientation, Following commands, Awareness, Problem solving                 Orientation Level: Disoriented to, Situation, Time     Following Commands: Follows one step commands with increased time Safety/Judgement: Decreased awareness of deficits Awareness: Emergent Problem Solving: Slow processing, Decreased initiation, Difficulty sequencing, Requires verbal cues, Requires tactile cues          General Comments General comments (skin integrity, edema, etc.): HR up to 101 with mobility seated at edge of bed for approx 10 minutes    Exercises Total Joint Exercises Ankle Circles/Pumps: AROM, Strengthening, Both, 10 reps, Supine Quad Sets: AROM, Strengthening, Both, 10 reps, Supine Gluteal Sets:  AROM, Strengthening, Both, 10 reps, Supine Hip ABduction/ADduction: AROM, Strengthening, Both, 10 reps, Supine Straight Leg Raises: AROM, Strengthening, Both, 10 reps, Supine Long Arc Quad: AROM, Strengthening, Both, 10 reps, Supine Marching in Standing: AROM, Strengthening, Both, 10 reps, Standing   Assessment/Plan    PT Assessment Patient needs continued PT services  PT Problem List Decreased strength;Decreased range of motion;Decreased activity tolerance;Decreased balance;Decreased mobility       PT Treatment Interventions Gait training;DME instruction;Stair training;Functional mobility training;Therapeutic exercise;Therapeutic activities;Balance training;Neuromuscular re-education;Patient/family education    PT Goals (Current goals can be found in the Care Plan section)  Acute Rehab PT Goals Patient Stated Goal: to get stronger. PT Goal Formulation: With patient Time For Goal Achievement: 01/21/22 Potential to Achieve Goals: Good    Frequency Min 2X/week     Co-evaluation               AM-PAC PT "6 Clicks" Mobility  Outcome Measure Help needed turning from your back to your side while in a flat bed without using bedrails?: A Little Help needed moving from lying on your back to sitting on the side of a flat bed without using bedrails?: A Little Help needed moving to and from a bed to a chair (including a wheelchair)?: A Lot Help needed standing up from a chair using your arms (e.g., wheelchair  or bedside chair)?: A Lot Help needed to walk in hospital room?: A Lot Help needed climbing 3-5 steps with a railing? : Total 6 Click Score: 13    End of Session   Activity Tolerance: Patient tolerated treatment well Patient left: in bed;with call bell/phone within reach (OT in the room) Nurse Communication: Mobility status PT Visit Diagnosis: Unsteadiness on feet (R26.81);Muscle weakness (generalized) (M62.81)    Time: 6759-1638 PT Time Calculation (min) (ACUTE ONLY): 19  min   Charges:   PT Evaluation $PT Eval Low Complexity: 1 Low PT Treatments $Therapeutic Exercise: 8-22 mins        Nolon Bussing, PT, DPT 01/07/22, 4:22 PM   Phineas Real 01/07/2022, 4:17 PM

## 2022-01-07 NOTE — Progress Notes (Signed)
MD Mansy notified of pt impending admission to this floor. Pt rectal temp in ED 90.7 with HR of 38. This RN concerned for possible need to pace while temp is addressed. MD has opted for one time dose of atropine a this Time.

## 2022-01-07 NOTE — Assessment & Plan Note (Addendum)
-   continue statin therapy. 

## 2022-01-07 NOTE — Evaluation (Signed)
Occupational Therapy Evaluation Patient Details Name: Evan Townsend. MRN: [id number] DOB: [birth year] Today's Date: 01/07/2022   History of Present Illness Pt is a 66 year old admitted with symptomatic bradycardia, sepsis due to penumonia following acute onset of mid sternal chest pain felt as pressure and graded 10/10 in severity with no nausea or vomiting or diaphoresis. 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 Wife present at start of evaluation, states pt has progressively gotten weaker since previous admission requiring assist for all ADL/IADL/functional mobility. Pt presents with deficits in activity tolerance, strength, endurance all affecting safe and optimal ADL completion. Supine>sit completed with MOD A, STS with MOD A. Pt tolerated seated at edge of bed for approx 10 minutes without complaint. TOTAL A required for LB dressing, MOD A for feeding. At this time pt is performing ADL/ functional moibity below PLOF, will benefit from STR to address deficits. Pt wants to discharge home, potential to dc home with HHOT pending progress on floor however would recommend STR with current level of performance at eval. Pt is left on edge of bed in care of PT, NAD, all needs met. OT will follow acutely.    Recommendations for follow up therapy are one component of a multi-disciplinary discharge planning process, led by the attending physician.  Recommendations may be updated based on patient status, additional functional criteria and insurance authorization.   Follow Up Recommendations  Skilled nursing-short term rehab (<3 hours/day)    Assistance Recommended at Discharge Frequent  or constant Supervision/Assistance  Patient can return home with the following A lot of help with walking and/or transfers;A lot of help with bathing/dressing/bathroom;Assistance with cooking/housework    Functional Status Assessment  Patient has had a recent decline in their functional status and demonstrates the ability to make significant improvements in function in a reasonable and predictable amount of time.  Equipment Recommendations  Other (comment) (per next venue of care)    Recommendations for Other Services       Precautions / Restrictions Precautions Precautions: Fall Restrictions Weight Bearing Restrictions: No      Mobility Bed Mobility Overal bed mobility: Needs Assistance Bed Mobility: Supine to Sit     Supine to sit: Mod assist, HOB elevated     General bed mobility comments: frequent vcs    Transfers Overall transfer level: Needs assistance Equipment used: Rolling walker (2 wheels) Transfers: Sit to/from Stand Sit to Stand: Mod assist                  Balance Overall balance assessment: Needs assistance Sitting-balance support: Feet supported Sitting balance-Leahy Scale: Fair   Postural control: Posterior lean Standing balance support: Bilateral upper extremity supported, Reliant on assistive device for balance Standing balance-Leahy Scale: Poor                             ADL either performed or assessed with clinical judgement   ADL Overall ADL's : Needs assistance/impaired Eating/Feeding: Moderate assistance;Sitting Eating/Feeding Details (indicate cue type and reason): at edge of bed                 Lower Body Dressing: Total assistance;Bed level   Toilet Transfer:  Minimal assistance Toilet Transfer Details (indicate cue type and reason): simulated Toileting- Clothing Manipulation and Hygiene: Total assistance               Vision Patient Visual Report: No change from baseline       Perception      Praxis      Pertinent Vitals/Pain Pain Assessment Pain Assessment: No/denies pain     Hand Dominance Right   Extremity/Trunk Assessment Upper Extremity Assessment Upper Extremity Assessment: LUE deficits/detail LUE Deficits / Details: chronic LUE weakness from prior CVA, RUE generalized weakness   Lower Extremity Assessment Lower Extremity Assessment: LLE deficits/detail;Defer to PT evaluation       Communication Communication Communication: Expressive difficulties   Cognition Arousal/Alertness: Awake/alert Behavior During Therapy: Flat affect Overall Cognitive Status: Impaired/Different from baseline Area of Impairment: Orientation, Following commands, Awareness, Problem solving                 Orientation Level: Disoriented to, Situation, Time     Following Commands: Follows one step commands with increased time Safety/Judgement: Decreased awareness of deficits Awareness: Emergent Problem Solving: Slow processing, Decreased initiation, Difficulty sequencing, Requires verbal cues, Requires tactile cues       General Comments  HR up to 101 with mobility seated at edge of bed for approx 10 minutes    Exercises Other Exercises Other Exercises: edu re: role of OT, role of rehab, discharge recommendations, safe ADL completion   Shoulder Instructions      Home Living Family/patient expects to be discharged to:: Private residence Living Arrangements: Spouse/significant other Available Help at Discharge: Family;Available PRN/intermittently (had an aid for 3-4 hrs a day 6 days a week, wife with him SUndays) Type of Home: House Home Access: Stairs to enter [employer] of Steps: 3, ramp in the back pt uses Entrance Stairs-Rails: Right;Left Home Layout: One level     Bathroom Shower/Tub: Teacher, early years/pre: Handicapped height Bathroom Accessibility: Yes   Home Equipment: Advice worker (2 wheels);Cane - single point (spc  with platform)          Prior Functioning/Environment               Mobility Comments: was amb with loftstrand crutches, decreased tolerance and overall mobility since previous admissionper wife report ADLs Comments: since discharge assist for all ADL/IADL from wife, has HHA 6 days per week 3-4 hrs per day        OT Problem List: Decreased activity tolerance;Impaired balance (sitting and/or standing);Cardiopulmonary status limiting activity;Decreased knowledge of use of DME or AE;Decreased knowledge of precautions;Decreased strength;Decreased cognition      OT Treatment/Interventions: Self-care/ADL training;Therapeutic exercise;Patient/family education;Energy conservation;DME and/or AE instruction;Cognitive remediation/compensation;Therapeutic activities    OT Goals(Current goals can be found in the care plan section) Acute Rehab OT Goals Patient Stated Goal: get stronger OT Goal Formulation: With patient Time For Goal Achievement: 01/21/22 Potential to Achieve Goals: Good ADL Goals Pt Will Perform Grooming: with set-up;sitting;standing Pt Will Perform Upper Body Dressing: with set-up;sitting Pt Will Perform Lower Body Dressing: with set-up;sit to/from stand Pt Will Transfer to Toilet: with supervision;bedside commode Pt Will Perform Toileting - Clothing Manipulation and hygiene: with supervision;sit to/from stand  OT Frequency: Min 2X/week    Co-evaluation              AM-PAC OT "6 Clicks" Daily Activity     Outcome Measure Help from another person eating meals?: A Little Help from another person taking care of personal grooming?:  A Little Help from another person toileting, which includes using toliet, bedpan, or urinal?: Total Help from another person bathing (including washing, rinsing, drying)?: A Lot Help from another person to put on and taking off regular upper body clothing?: A Little Help from another person to put on and taking off regular lower body  clothing?: Total 6 Click Score: 13   End of Session Equipment Utilized During Treatment: Rolling walker (2 wheels) Nurse Communication: Mobility status  Activity Tolerance: Patient tolerated treatment well Patient left: with call bell/phone within reach (at edge of bed with PT)  OT Visit Diagnosis: Muscle weakness (generalized) (M62.81);Unsteadiness on feet (R26.81)                Time: 7517-0017 OT Time Calculation (min): 25 min Charges:  OT General Charges $OT Visit: 1 Visit OT Evaluation $OT Eval Moderate Complexity: 1 Mod OT Treatments $Self Care/Home Management : 8-22 mins  Shanon Payor, OTD OTR/L  01/07/22, 3:31 PM

## 2022-01-07 NOTE — Assessment & Plan Note (Addendum)
--  BP varied widely Plan: --cont home amlodipine --start oral hydralazine 50 mg q8h

## 2022-01-07 NOTE — Progress Notes (Addendum)
Nutrition Brief Note  Patient identified on the Low Braden Report.   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   Pt admitted with symptomatic bradycardia.   Plan for CMR tomorrow to evaluate to myocarditis and sarcoidosis.   Spoke with pt at bedside, who reports feeling poorly today. He reports he has a good appetite at home. He does not follow any structured meal plan but grazes throughout the day. Pt shares the he wears dentures and left them at home; feels like he will be able to tolerate dysphagia 3 diet. He has not eaten anything since admission.   Pt denies any weight loss. No wt loss noted over the past 3 months.   Nutrition-Focused physical exam completed. Findings are no fat depletion, mild muscle depletion, and no edema.    Current diet order is dysphagia 3, patient is consuming approximately n/a% of meals at this time. Labs and medications reviewed.   No nutrition interventions warranted at this time. If nutrition issues arise, please consult RD.   Levada Schilling, RD, LDN, CDCES Registered Dietitian II Certified Diabetes Care and Education Specialist Please refer to [facility] for RD and/or RD on-call/weekend/after hours pager

## 2022-01-07 NOTE — Progress Notes (Signed)
Report given to [facility]. Transport pending now

## 2022-01-07 NOTE — Assessment & Plan Note (Addendum)
-   mild leukopenia and hypothermia, however, no cough or hypoxia.  CT chest showed Mild right basilar and left lower lobe linear scarring, atelectasis and/or infiltrate. --received 3 days of ceftriaxone and azithro Plan: --switch to Levaquin to finish 5 days of empiric tx

## 2022-01-07 NOTE — Assessment & Plan Note (Signed)
-   continue his Flomax.

## 2022-01-07 NOTE — Progress Notes (Signed)
CODE SEPSIS - PHARMACY COMMUNICATION  **Broad Spectrum Antibiotics should be administered within 1 hour of Sepsis diagnosis**  Time Code Sepsis Called/Page Received:  5/24 @ 0040  Antibiotics Ordered: Vancomycin , Cefepime   Time of 1st antibiotic administration: Cefepime 2 gm IV X 1 on 5/24 @ 0103  Additional action taken by pharmacy:   If necessary, Name of Provider/Nurse Contacted:     Jazmene Racz D ,PharmD Clinical Pharmacist  01/07/2022  1:26 AM

## 2022-01-07 NOTE — ED Notes (Signed)
Per York Cerise, MD, do not put bair hugger on pt at this time.

## 2022-01-07 NOTE — Assessment & Plan Note (Signed)
-   We will follow neurochecks every 4 hours with 24 hours. - We will obtain a brain MRI without contrast in a.m. with stabilization of his heart rate. - PT/OT and ST consults will be obtained. - Neurology consult to be obtained.

## 2022-01-07 NOTE — Progress Notes (Signed)
Pt sent to the ER for neutropenia. Admitted there now for hypothermia and sepsis from a PNA>

## 2022-01-07 NOTE — Progress Notes (Signed)
PHARMACY -  BRIEF ANTIBIOTIC NOTE   Pharmacy has received consult(s) for Cefepime, Vancomycin  from an ED provider.  The patient's profile has been reviewed for ht/wt/allergies/indication/available labs.    One time order(s) placed for Cefepime 2 gm IV X 1 and Vancomycin 1500 mg IV X 1  Further antibiotics/pharmacy consults should be ordered by admitting physician if indicated.                       Thank you, Obe Ahlers D 01/07/2022  1:24 AM

## 2022-01-07 NOTE — Progress Notes (Signed)
ED RN Romelle Starcher notified of MD order for atropine to be given prior to transfer to the floor.

## 2022-01-07 NOTE — Consult Note (Signed)
Electrophysiology consultation:   Patient ID: Evan AnoDuncan E Ruddy Sr. MRN: [id number]; DOB: 1955-12-11  Admit date: 01/06/2022 Date of Consult: 01/07/2022  PCP:  Loura PardonVigg, Avanti, MD   [facility] Providers Cardiologist:  Yvonne Kendallhristopher End, MD  Electrophysiologist:  Lanier PrudeAMERON T Raul Torrance, MD  {   Patient Profile:   Evan AnoDuncan E Brekke Sr. is a 66 y.o. male with a hx of strokes, paroxysmal atrial fibrillation, hypertension, hyperlipidemia, diabetes, carotid artery disease, tobacco abuse who is being seen 01/07/2022 for the evaluation of bradycardia at the request of Dr. Azucena CecilAgbor-Etang.  History of Present Illness:   Evan Townsend was admitted to the hospital Jan 07, 2022 with chest pain.  Started 2 days ago and is intermittent.  In the emergency department he was found to be hypothermic and leukopenic.  EKG was performed which showed sinus bradycardia, right bundle branch block and left anterior fascicular block.  Chest x-ray was consistent with a possible pneumonia.  He was started on IV antibiotics and admitted for further evaluation.     Past Medical History:  Diagnosis Date   Carotid arterial disease (HCC)    a. 08/2018 Carotid U/S: min-mod RICA atherosclerosis w/o hemodynamically significant stenosis. Nl LICA.   Diabetes 1.5, managed as type 2 (HCC)    Diastolic dysfunction    a. 08/2018 Echo: EF 65%. No rwma. Gr1 DD. Mild MR.   Diastolic dysfunction    a. 08/2019 Echo: EF 55-60%, Gr1 DD. No rwma. Mild MR. RVSP 37.726mmHg.   Hypercholesterolemia    Hypertension    PAF (paroxysmal atrial fibrillation) (HCC)    a. 10/2019 Event Monitor: PAF; b. CHA2DS2VASc = 5-->Eliquis.   Poorly controlled diabetes mellitus (HCC)    a. 04/2019 A1c 13.8.   Recurrent strokes (HCC)    a. 10/2016 MRI/A: Acute 5mm R thalamic infarct, ? subacute infarct of R corona radiata; b. 08/2017 MRI/A: Acute 8mm lateral L thalamic infarct. Other more remote lacunar infarcts of thalami bilat. Small vessel dzs; c. 08/2018 MRI/A: Acute  lacunar infarct of the post limb of R internal capsule; d. 08/2019 MRI Acute CVA of L paramedian pons adn R cerebellar hemisphere.   Tobacco abuse     Past Surgical History:  Procedure Laterality Date   ESOPHAGOGASTRODUODENOSCOPY (EGD) WITH PROPOFOL N/A 04/26/2019   Procedure: ESOPHAGOGASTRODUODENOSCOPY (EGD) WITH PROPOFOL;  Surgeon: Toney ReilVanga, Rohini Reddy, MD;  Location: [facility] ENDOSCOPY;  Service: Gastroenterology;  Laterality: N/A;   NO PAST SURGERIES         Inpatient Medications: Scheduled Meds:   stroke: early stages of recovery book   Does not apply Once   apixaban  5 mg Oral BID   atorvastatin  80 mg Oral QPM   Chlorhexidine Gluconate Cloth  6 each Topical Daily   dapagliflozin propanediol  10 mg Oral QAC breakfast   mirtazapine  7.5 mg Oral QHS   pantoprazole  80 mg Oral Daily   tamsulosin  0.4 mg Oral Daily   Continuous Infusions:  azithromycin     cefTRIAXone (ROCEPHIN)  IV Stopped (01/07/22 0954)   PRN Meds: acetaminophen **OR** acetaminophen, albuterol, magnesium hydroxide, nitroGLYCERIN, ondansetron **OR** ondansetron (ZOFRAN) IV, traZODone  Allergies:   No Known Allergies  Social History:   Social History   Socioeconomic History   Marital status: Married    Spouse name: Solicitorbeverly Pals   Number of children: 5   Years of education: Not on file   Highest education level: Not on file  Occupational History   Occupation: unemployed  Tobacco Use  Smoking status: Former    Packs/day: 1.00    Years: 38.00    Pack years: 38.00    Types: Cigarettes    Start date: 1982   Smokeless tobacco: Never  Vaping Use   Vaping Use: Never used  Substance and Sexual Activity   Alcohol use: Not Currently    Alcohol/week: 1.0 standard drink    Types: 1 Cans of beer per week    Comment: previously drank but nothing in 1-2 yrs (08/2019).   Drug use: Not Currently    Types: Cocaine, Marijuana    Comment: prev used cocaine/marijuana but none x 1-2 yrs (08/2019).   Sexual  activity: Yes  Other Topics Concern   Not on file  Social History Narrative   Lives in [place] with his wife.  Uses a cane when ambulating.  Can walk about 25 yds prior to having to rest - says he stumbles if he has to work further than that.  Usually uses a scooter to get around stores.   Social Determinants of Health   Financial Resource Strain: Not on file  Food Insecurity: Not on file  Transportation Needs: Not on file  Physical Activity: Not on file  Stress: Not on file  Social Connections: Not on file  Intimate Partner Violence: Not on file    Family History:    Family History  Problem Relation Age of Onset   Hypertension Mother    Stroke Mother        died @ age 12   Hypertension Father    Diabetes Father    Heart attack Father        died @ 40     ROS:  Please see the history of present illness.   All other ROS reviewed and negative.     Physical Exam/Data:   Vitals:   01/07/22 0800 01/07/22 0900 01/07/22 1000 01/07/22 1100  BP: 130/75 128/71 126/74 120/71  Pulse: 62 63 67 (!) 53  Resp: (!) 6 10 11  (!) 9  Temp: (!) 97.2 F (36.2 C) 98.6 F (37 C) 98.1 F (36.7 C) 97.9 F (36.6 C)  TempSrc: Rectal Rectal Rectal   SpO2: 100% 100% 100% 99%  Weight:      Height:        Intake/Output Summary (Last 24 hours) at 01/07/2022 1135 Last data filed at 01/07/2022 1000 Gross per 24 hour  Intake 1634.36 ml  Output 800 ml  Net 834.36 ml      01/07/2022    3:00 AM 01/06/2022    4:14 PM 01/01/2022    2:35 PM  Last 3 Weights  Weight (lbs) 148 lb 13 oz 144 lb 13.5 oz 145 lb  Weight (kg) 67.5 kg 65.7 kg 65.772 kg     Body mass index is 24.02 kg/m.    General:  Well nourished, well developed, in no acute distress.  Laying at 30 degrees in bed. HEENT: normal Neck: no JVD Vascular: No carotid bruits; Distal pulses 2+ bilaterally Cardiac:  normal S1, S2; RRR; no murmur  Lungs:  clear to auscultation bilaterally, no wheezing, rhonchi or rales  Abd: soft,  nontender, no hepatomegaly  Ext: no edema Musculoskeletal:  No deformities, BUE and BLE strength normal and equal Skin: warm and dry  Neuro:  CNs 2-12 intact, no focal abnormalities noted Psych:  Normal affect   EKG:  The EKG was personally reviewed and demonstrates: Right bundle branch block, first-degree AV block, left anterior fascicular block  Telemetry:  Telemetry  was personally reviewed and demonstrates: Sinus rhythm with rates in the 60s.  No episodes of complete heart block.  No significant bradycardic episodes.  Relevant CV Studies:  November 19, 2021 ZIO monitor personally reviewed shows 2-1 AV block, no clear complete heart block.  Average heart rate 56 bpm (range 27-126)  October 17, 2021 echo Left ventricular function low normal, 50 to 55% Right ventricular function normal Mild MR   CT scan of the chest performed Jan 06, 2022 shows left lower lobe linear scarring/infiltrate/atelectasis.  Moderate severity coronary artery calcifications in all 3 coronary beds.     Laboratory Data:  High Sensitivity Troponin:   Recent Labs  Lab 01/06/22 1621 01/06/22 2015 01/07/22 0320  TROPONINIHS Chemistry Recent Labs  Lab 01/06/22 1319 01/06/22 1621 01/07/22 0321  NA 138 137 141  K 3.9 3.5 3.9  CL 104 106 113*  CO2 GLUCOSE 112* 174* 97  BUN 22 25* 25*  CREATININE 1.48* 1.65* 1.57*  CALCIUM 8.7 8.8* 8.5*  GFRNONAA  --  46* 49*  ANIONGAP  --  8 5    Recent Labs  Lab 01/01/22 1504 01/06/22 1319 01/06/22 2015  PROT 6.8 6.7 7.1  ALBUMIN 4.0 4.0 3.4*  AST 63* 37 41  ALT 105* 77* 77*  ALKPHOS 194* 181* 140*  BILITOT <0.2 <0.2 0.4   Lipids  Recent Labs  Lab 01/07/22 0321  CHOL 130  TRIG 181*  HDL 59  LDLCALC 35  CHOLHDL 2.2    Hematology Recent Labs  Lab 01/06/22 1319 01/06/22 1621 01/07/22 0321  WBC 2.6* 3.0* 3.7*  RBC 3.18* 3.11* 2.87*  HGB 9.0* 8.6* 7.9*  HCT 26.9* 27.6* 25.3*  MCV 85 88.7 88.2  MCH 28.3 27.7 27.5  MCHC 33.5  31.2 31.2  RDW 15.8* 17.2* 17.1*  PLT 206 212 181   Thyroid No results for input(s): TSH, FREET4 in the last 168 hours.  BNPNo results for input(s): BNP, PROBNP in the last 168 hours.  DDimer No results for input(s): DDIMER in the last 168 hours.   Radiology/Studies:  DG Chest 2 View  Result Date: 01/06/2022 CLINICAL DATA:  A 66 year old male presents for evaluation of chest pain. EXAM: CHEST - 2 VIEW COMPARISON:  Dec 28, 2021. FINDINGS: Trachea midline. Mild cardiac enlargement, accentuated by portable technique and AP projection. Patchy linear basilar airspace disease without lobar consolidation. No pneumothorax. On lateral projection airspace disease appears more confluent likely in the LEFT lung base. On limited assessment no acute skeletal findings. IMPRESSION: Signs of atelectasis but with more confluent area of consolidation at the LEFT lung base that may represent volume loss or developing pneumonia. Electronically Signed   By: Donzetta Kohut M.D.   On: 01/06/2022 16:45   CT Head Wo Contrast  Result Date: 01/07/2022 CLINICAL DATA:  Chest pain under the left nipple since last night which radiates to the left neck. EXAM: CT HEAD WITHOUT CONTRAST TECHNIQUE: Contiguous axial images were obtained from the base of the skull through the vertex without intravenous contrast. RADIATION DOSE REDUCTION: This exam was performed according to the departmental dose-optimization program which includes automated exposure control, adjustment of the mA and/or kV according to patient size and/or use of iterative reconstruction technique. COMPARISON:  Dec 28, 2021 FINDINGS: Brain: There is mild cerebral atrophy with widening of the extra-axial spaces and ventricular dilatation. There are areas of decreased attenuation within the white matter tracts of the supratentorial brain, consistent  with microvascular disease changes. Vascular: No hyperdense vessel or unexpected calcification. Skull: Normal. Negative for  fracture or focal lesion. Sinuses/Orbits: There is mild left maxillary sinus mucosal thickening Other: None. IMPRESSION: 1. No acute intracranial abnormality. 2. Generalized cerebral atrophy with chronic white matter small vessel ischemic changes. Electronically Signed   By: Aram Candela M.D.   On: 01/07/2022 00:08   CT Chest Wo Contrast  Result Date: 01/07/2022 CLINICAL DATA:  Chest pain. EXAM: CT CHEST WITHOUT CONTRAST TECHNIQUE: Multidetector CT imaging of the chest was performed following the standard protocol without IV contrast. RADIATION DOSE REDUCTION: This exam was performed according to the departmental dose-optimization program which includes automated exposure control, adjustment of the mA and/or kV according to patient size and/or use of iterative reconstruction technique. COMPARISON:  September 14, 2021 FINDINGS: Cardiovascular: No significant vascular findings. There is mild cardiomegaly with moderate severity coronary artery calcification. A trace amount of pericardial fluid is noted. Mediastinum/Nodes: No enlarged mediastinal or axillary lymph nodes. Thyroid gland, trachea, and esophagus demonstrate no significant findings. Lungs/Pleura: Mild posteromedial right basilar and left lower lobe linear scarring, atelectasis and/or infiltrate is seen. This is mildly increased in severity when compared to the prior study. There is no evidence of a pleural effusion or pneumothorax Upper Abdomen: No acute abnormality. Musculoskeletal: No chest wall mass or suspicious bone lesions identified. IMPRESSION: 1. Mild right basilar and left lower lobe linear scarring, atelectasis and/or infiltrate. This is mildly increased in severity when compared to the prior study. 2. Mild cardiomegaly with moderate severity coronary artery calcification. Electronically Signed   By: Aram Candela M.D.   On: 01/07/2022 00:12     Assessment and Plan:   Mr. Dorner is a pleasant 66 year old man who I am asked to see  today for an evaluation of bradycardia.  He has significant baseline conduction system disease with a right bundle branch block, left anterior fascicular block and first-degree AV delay.  Given his age, this raises the question of an infiltrative cardiomyopathy.  Cross-sectional imaging performed thus far is also significant for three-vessel coronary artery disease.  I would recommend that we first evaluate his coronaries to make sure there is no obstructive lesions.  Left heart catheterization.  To further evaluate for the cause of his conduction system disease, I would recommend a cardiac MRI.  This would require transfer to main campus.  He may ultimately require a permanent pacemaker.  I will follow along for the results of the cardiac MRI and make a final determination after the results are in.  We will also follow along with the telemetry to follow his heart rates.  If his heart rates remained stable, could likely implant pacemaker as an outpatient to allow full resolution of pneumonia to help mitigate infection risk.    For questions or updates, please contact [facility] Please consult [email or web address] for contact info under    Signed, Lanier Prude, MD  01/07/2022 11:35 AM

## 2022-01-07 NOTE — Consult Note (Signed)
Cardiology Consultation:   Patient ID: Evan Townsend Sr. MRN: [id number]; DOB: November 08, 1955  Admit date: 01/06/2022 Date of Consult: 01/07/2022  PCP:  Charlynne Cousins, MD   [facility] Providers Cardiologist:  Nelva Bush, MD   {   Patient Profile:   Evan Townsend Sr. is a 66 y.o. male with a hx of strokes, paroxysmal afib, HTN, HLD, poorly controlled diabetes, cerebrovascular and carotid disease, and tobacco use who is being seen 01/07/2022 for the evaluation of bradycardua at the request of Dr. Billie Ruddy.  History of Present Illness:   Evan Townsend was admitted to [facility] in January 2021 with chest pain and hyperglycemia. Echo showed normal LVSF. Stress testing was nonischemic. The day after discharge, he presented to the [facility] ED secondary to dysarthria, left facial droop, and worsening left-sided weakness. MRI showed acute stroke in the left paramedian pons and right cerebellar hemisphere. He subsequently underwent outpatient monitoring which showed paroxysmal afib and he was placed on Eliquis therapy.   He was admitted in March 2023 for bradycardia with heart rates in the 30s on metoprolol. BB was held on admission. He was treated with glucagon, calcium and dopamine. Heart rates improved off FF. Heart monitor at discharged showed predominately NSR with Mobitz type 1 second degree AV block.   Last seen 11/20/21 and was doing well. EKG showed NSR with HR 62bpm.   Hospitalized earlier this month for AKI, AMS thought to be due to dehydration, recent outpatient dx of pneumonia.   The patient presented to [facility] 01/06/22 for chest pain. Chest pain started 2 days ago. It is low left-sided of the chest. It was coming and going. Not worse with exertion. EMS was called but he was told it was not a heart attack, so the patient refused transport. He saw his PCP, who recommended he go to the Er. Wife noted slurred speech. Says since the last office visit patient has been weak, dizzy and lightheaded. Has had  trouble walking.   In the ER BP 147/79, pulse 54, RR 16, and hypothermic. Labs showed Scr 1.65, BUN 25, calcium 8.8, WBC 3.0, Hgb 8.6, albumin 3.4, ALT 77, alk phos 140. LA wnl. HS trop negative x 2. EKG showed SR with 1st degree AV block, RBBB. subsequent EKG shows SB 39bpm with RBBB. CXR with possible PNA. CT head showed showed no acute abnormality. CT chest showed atelectasis vs infiltrate. He was given IVF, started on IV abx and admitted for further work-up.   Past Medical History:  Diagnosis Date   Carotid arterial disease ([place])    a. 08/2018 Carotid U/S: min-mod RICA atherosclerosis w/o hemodynamically significant stenosis. Nl LICA.   Diabetes 1.5, managed as type 2 ([place])    Diastolic dysfunction    a. 08/2018 Echo: EF 65%. No rwma. Gr1 DD. Mild MR.   Diastolic dysfunction    a. 08/2019 Echo: EF 55-60%, Gr1 DD. No rwma. Mild MR. RVSP 37.70mHg.   Hypercholesterolemia    Hypertension    PAF (paroxysmal atrial fibrillation) ([place]    a. 10/2019 Event Monitor: PAF; b. CHA2DS2VASc = 5-->Eliquis.   Poorly controlled diabetes mellitus ([place]    a. 04/2019 A1c 13.8.   Recurrent strokes ([place]    a. 10/2016 MRI/A: Acute 571mR thalamic infarct, ? subacute infarct of R corona radiata; b. 08/2017 MRI/A: Acute 62m37materal L thalamic infarct. Other more remote lacunar infarcts of thalami bilat. Small vessel dzs; c. 08/2018 MRI/A: Acute lacunar infarct of the post limb of R internal capsule;  d. 08/2019 MRI Acute CVA of L paramedian pons adn R cerebellar hemisphere.   Tobacco abuse     Past Surgical History:  Procedure Laterality Date   ESOPHAGOGASTRODUODENOSCOPY (EGD) WITH PROPOFOL N/A 04/26/2019   Procedure: ESOPHAGOGASTRODUODENOSCOPY (EGD) WITH PROPOFOL;  Surgeon: Lin Landsman, MD;  Location: [facility] ENDOSCOPY;  Service: Gastroenterology;  Laterality: N/A;   NO PAST SURGERIES       Home Medications:  Prior to Admission medications   Medication Sig Start Date End Date Taking? Authorizing Provider   Accu-Chek Softclix Lancets lancets SMARTSIG:Topical 12/24/20  Yes Sowles, Drue Stager, MD  albuterol (VENTOLIN HFA) 108 (90 Base) MCG/ACT inhaler Inhale 2 puffs into the lungs every 6 (six) hours as needed for wheezing or shortness of breath. 02/26/21  Yes Vigg, Avanti, MD  amLODipine (NORVASC) 10 MG tablet Take 1 tablet (10 mg total) by mouth daily. 06/27/21  Yes Vigg, Avanti, MD  apixaban (ELIQUIS) 5 MG TABS tablet Take 1 tablet (5 mg total) by mouth 2 (two) times daily. 10/18/21  Yes Wieting, Richard, MD  atorvastatin (LIPITOR) 80 MG tablet Take 1 tablet (80 mg total) by mouth daily. Patient taking differently: Take 80 mg by mouth every evening. 03/20/21  Yes Vigg, Avanti, MD  Blood Glucose Monitoring Suppl KIT Accucheck Guide with lancets and test strips #100 with one refill.  Test blood sugar twice daily. DX Code E11.65 ; Z79.4 07/09/20  Yes Towanda Malkin, MD  Continuous Blood Gluc Sensor (FREESTYLE LIBRE 2 SENSOR) MISC 1 each by Does not apply route every 14 (fourteen) days. 06/20/21  Yes Vigg, Avanti, MD  dapagliflozin propanediol (FARXIGA) 10 MG TABS tablet Take 1 tablet (10 mg total) by mouth daily before breakfast. In place of glipizide 01/01/22  Yes Vigg, Avanti, MD  Incontinence Supply Disposable (COMFORT PROTECT ADULT DIAPER/L) MISC 1 Units by Does not apply route daily. Use 3-4 a day as needed 06/20/21  Yes Vigg, Avanti, MD  Insulin Pen Needle 32G X 6 MM MISC Daily 10/24/20  Yes Sowles, Drue Stager, MD  metoCLOPramide (REGLAN) 5 MG tablet TAKE 1 TABLET BY MOUTH 3 TIMES DAILY BEFORE MEALS 12/25/21  Yes Vigg, Avanti, MD  mirtazapine (REMERON) 7.5 MG tablet Take 1 tablet (7.5 mg total) by mouth at bedtime. 01/01/22  Yes Vigg, Avanti, MD  Misc. Devices MISC One pair of Compression stockings  Hemiplegia and hemiparesis following cerebral infarction affecting left non-dominant side ([place])  - Primary Codes: F29.244 Lower extremity edema  Codes: R60.0 Type 2 diabetes mellitus with hyperglycemia, with  long-term current use of insulin ([place])  Codes: E11.65, Z79.4 06/08/20  Yes Towanda Malkin, MD  omeprazole (PRILOSEC) 40 MG capsule TAKE 1 CAPSULE BY MOUTH IN THE MORNING AND AT BEDTIME Patient taking differently: Take 40 mg by mouth in the morning and at bedtime. 08/05/21  Yes Vigg, Avanti, MD  tamsulosin (FLOMAX) 0.4 MG CAPS capsule Take 1 capsule (0.4 mg total) by mouth daily. 10/18/21  Yes Loletha Grayer, MD  ACCU-CHEK GUIDE test strip Check fsbs once daily 12/24/20   Steele Sizer, MD  nitroGLYCERIN (NITROSTAT) 0.4 MG SL tablet Place 1 tablet (0.4 mg total) under the tongue every 5 (five) minutes as needed for chest pain. 09/16/21   Emeterio Reeve, DO    Inpatient Medications: Scheduled Meds:   stroke: early stages of recovery book   Does not apply Once   apixaban  5 mg Oral BID   aspirin EC  81 mg Oral Daily   atorvastatin  80 mg Oral QPM  Chlorhexidine Gluconate Cloth  6 each Topical Daily   dapagliflozin propanediol  10 mg Oral QAC breakfast   mirtazapine  7.5 mg Oral QHS   pantoprazole  80 mg Oral Daily   tamsulosin  0.4 mg Oral Daily   Continuous Infusions:  0.9 % NaCl with KCl 20 mEq / L 100 mL/hr at 01/07/22 0539   azithromycin     cefTRIAXone (ROCEPHIN)  IV     vancomycin     PRN Meds: acetaminophen **OR** acetaminophen, albuterol, magnesium hydroxide, morphine injection, nitroGLYCERIN, ondansetron **OR** ondansetron (ZOFRAN) IV, traZODone  Allergies:   No Known Allergies  Social History:   Social History   Socioeconomic History   Marital status: Married    Spouse name: Presenter, broadcasting   Number of children: 5   Years of education: Not on file   Highest education level: Not on file  Occupational History   Occupation: unemployed  Tobacco Use   Smoking status: Former    Packs/day: 1.00    Years: 38.00    Pack years: 38.00    Types: Cigarettes    Start date: 1982   Smokeless tobacco: Never  Vaping Use   Vaping Use: Never used  Substance and  Sexual Activity   Alcohol use: Not Currently    Alcohol/week: 1.0 standard drink    Types: 1 Cans of beer per week    Comment: previously drank but nothing in 1-2 yrs (08/2019).   Drug use: Not Currently    Types: Cocaine, Marijuana    Comment: prev used cocaine/marijuana but none x 1-2 yrs (08/2019).   Sexual activity: Yes  Other Topics Concern   Not on file  Social History Narrative   Lives in [place] with his wife.  Uses a cane when ambulating.  Can walk about 25 yds prior to having to rest - says he stumbles if he has to work further than that.  Usually uses a scooter to get around stores.   Social Determinants of Health   Financial Resource Strain: Not on file  Food Insecurity: Not on file  Transportation Needs: Not on file  Physical Activity: Not on file  Stress: Not on file  Social Connections: Not on file  Intimate Partner Violence: Not on file    Family History:    Family History  Problem Relation Age of Onset   Hypertension Mother    Stroke Mother        died @ age 76   Hypertension Father    Diabetes Father    Heart attack Father        died @ 12     ROS:  Please see the history of present illness.   All other ROS reviewed and negative.     Physical Exam/Data:   Vitals:   01/07/22 0520 01/07/22 0530 01/07/22 0600 01/07/22 0700  BP: (!) 148/84 137/82 127/75 (!) 142/79  Pulse: (!) 43 (!) 53 (!) 49 (!) 58  Resp: (!) 9 13 (!) 6 13  Temp: (!) 91.2 F (32.9 C) (!) 91.4 F (33 C) (!) 92.1 F (33.4 C) (!) 94.6 F (34.8 C)  TempSrc:   Rectal Rectal  SpO2: 100% 100% 100% 100%  Weight:      Height:        Intake/Output Summary (Last 24 hours) at 01/07/2022 0733 Last data filed at 01/07/2022 0136 Gross per 24 hour  Intake 1100 ml  Output --  Net 1100 ml      01/07/2022  3:00 AM 01/06/2022    4:14 PM 01/01/2022    2:35 PM  Last 3 Weights  Weight (lbs) 148 lb 13 oz 144 lb 13.5 oz 145 lb  Weight (kg) 67.5 kg 65.7 kg 65.772 kg     Body mass index  is 24.02 kg/m.  General:  Well nourished, well developed, in no acute distress HEENT: normal Neck: no JVD Vascular: No carotid bruits; Distal pulses 2+ bilaterally Cardiac:  normal S1, S2; bradycardia, RR; no murmur  Lungs:  clear to auscultation bilaterally, no wheezing, rhonchi or rales  Abd: soft, nontender, no hepatomegaly  Ext: 1+ lower leg edema, R>L Musculoskeletal:  No deformities, BUE and BLE strength normal and equal Skin: warm and dry  Neuro:  CNs 2-12 intact, no focal abnormalities noted Psych:  Normal affect   EKG:  The EKG was personally reviewed and demonstrates:  SB 39bpm, LAFB, RBBB Telemetry:  Telemetry was personally reviewed and demonstrates:  SB HR upper 30s-50s, no pauses and no high grade AV block noted  Relevant CV Studies:  Heart monitor 10/2021 The patient was monitored for 14 days. The predominant rhythm was sinus with an average rate of 56 bpm (range 27-126 bpm). There were rare PACs and PVCs. Episodes of Mobitz type I second-degree AV block and 2:1 AV block were observed. No prolonged pauses (greater than 3 seconds) was observed. Patient triggered events corresponded to sinus rhythm and PVCs.   Predominantly sinus rhythm with episodes of Mobitz type I second-degree AV block and 2:1 AV block.  Echo 10/17/21  1. Left ventricular ejection fraction, by estimation, is 50 to 55%. The  left ventricle has low normal function. The left ventricle has no regional  wall motion abnormalities. Left ventricular diastolic parameters are  consistent with Grade I diastolic  dysfunction (impaired relaxation).   2. Right ventricular systolic function is normal. The right ventricular  size is normal. There is normal pulmonary artery systolic pressure. The  estimated right ventricular systolic pressure is 77.9 mmHg.   3. The mitral valve is normal in structure. Mild mitral valve  regurgitation. No evidence of mitral stenosis.   4. The aortic valve is tricuspid. Aortic  valve regurgitation is not  visualized. No aortic stenosis is present.   5. The inferior vena cava is normal in size with greater than 50%  respiratory variability, suggesting right atrial pressure of 3 mmHg.   Myoview Lexiscan 2021 Narrative & Impression  This is a low risk study. The calculated left ventricular ejection fraction is moderately decreased (33%). This is likely a gating artifact as Visual EF appears normal at about 55%. recommend correlation with another imaging modality such as echocardiogram. There was no evidence for ischemia Mild diaphragmatic attenuation noted   Laboratory Data:  High Sensitivity Troponin:   Recent Labs  Lab 01/06/22 1621 01/06/22 2015  TROPONINIHS 7 7     Chemistry Recent Labs  Lab 01/06/22 1319 01/06/22 1621 01/07/22 0321  NA 138 137 141  K 3.9 3.5 3.9  CL 104 106 113*  CO2 _0 GLUCOSE 112* 174* 97  BUN 22 25* 25*  CREATININE 1.48* 1.65* 1.57*  CALCIUM 8.7 8.8* 8.5*  GFRNONAA  --  46* 49*  ANIONGAP  --  8 5    Recent Labs  Lab 01/01/22 1504 01/06/22 1319 01/06/22 2015  PROT 6.8 6.7 7.1  ALBUMIN 4.0 4.0 3.4*  AST 63* 37 41  ALT 105* 77* 77*  ALKPHOS 194* 181* 140*  BILITOT <0.2 <0.2  0.4   Lipids  Recent Labs  Lab 01/07/22 0321  CHOL 130  TRIG 181*  HDL 59  LDLCALC 35  CHOLHDL 2.2    Hematology Recent Labs  Lab 01/06/22 1319 01/06/22 1621 01/07/22 0321  WBC 2.6* 3.0* 3.7*  RBC 3.18* 3.11* 2.87*  HGB 9.0* 8.6* 7.9*  HCT 26.9* 27.6* 25.3*  MCV 85 88.7 88.2  MCH 28.3 27.7 27.5  MCHC 33.5 31.2 31.2  RDW 15.8* 17.2* 17.1*  PLT 206 212 181   Thyroid No results for input(s): TSH, FREET4 in the last 168 hours.  BNPNo results for input(s): BNP, PROBNP in the last 168 hours.  DDimer No results for input(s): DDIMER in the last 168 hours.   Radiology/Studies:  DG Chest 2 View  Result Date: 01/06/2022 CLINICAL DATA:  A 66 year old male presents for evaluation of chest pain. EXAM: CHEST - 2 VIEW  COMPARISON:  Dec 28, 2021. FINDINGS: Trachea midline. Mild cardiac enlargement, accentuated by portable technique and AP projection. Patchy linear basilar airspace disease without lobar consolidation. No pneumothorax. On lateral projection airspace disease appears more confluent likely in the LEFT lung base. On limited assessment no acute skeletal findings. IMPRESSION: Signs of atelectasis but with more confluent area of consolidation at the LEFT lung base that may represent volume loss or developing pneumonia. Electronically Signed   By: Zetta Bills M.D.   On: 01/06/2022 16:45   CT Head Wo Contrast  Result Date: 01/07/2022 CLINICAL DATA:  Chest pain under the left nipple since last night which radiates to the left neck. EXAM: CT HEAD WITHOUT CONTRAST TECHNIQUE: Contiguous axial images were obtained from the base of the skull through the vertex without intravenous contrast. RADIATION DOSE REDUCTION: This exam was performed according to the departmental dose-optimization program which includes automated exposure control, adjustment of the mA and/or kV according to patient size and/or use of iterative reconstruction technique. COMPARISON:  Dec 28, 2021 FINDINGS: Brain: There is mild cerebral atrophy with widening of the extra-axial spaces and ventricular dilatation. There are areas of decreased attenuation within the white matter tracts of the supratentorial brain, consistent with microvascular disease changes. Vascular: No hyperdense vessel or unexpected calcification. Skull: Normal. Negative for fracture or focal lesion. Sinuses/Orbits: There is mild left maxillary sinus mucosal thickening Other: None. IMPRESSION: 1. No acute intracranial abnormality. 2. Generalized cerebral atrophy with chronic white matter small vessel ischemic changes. Electronically Signed   By: Virgina Norfolk M.D.   On: 01/07/2022 00:08   CT Chest Wo Contrast  Result Date: 01/07/2022 CLINICAL DATA:  Chest pain. EXAM: CT CHEST  WITHOUT CONTRAST TECHNIQUE: Multidetector CT imaging of the chest was performed following the standard protocol without IV contrast. RADIATION DOSE REDUCTION: This exam was performed according to the departmental dose-optimization program which includes automated exposure control, adjustment of the mA and/or kV according to patient size and/or use of iterative reconstruction technique. COMPARISON:  September 14, 2021 FINDINGS: Cardiovascular: No significant vascular findings. There is mild cardiomegaly with moderate severity coronary artery calcification. A trace amount of pericardial fluid is noted. Mediastinum/Nodes: No enlarged mediastinal or axillary lymph nodes. Thyroid gland, trachea, and esophagus demonstrate no significant findings. Lungs/Pleura: Mild posteromedial right basilar and left lower lobe linear scarring, atelectasis and/or infiltrate is seen. This is mildly increased in severity when compared to the prior study. There is no evidence of a pleural effusion or pneumothorax Upper Abdomen: No acute abnormality. Musculoskeletal: No chest wall mass or suspicious bone lesions identified. IMPRESSION: 1. Mild right basilar and  left lower lobe linear scarring, atelectasis and/or infiltrate. This is mildly increased in severity when compared to the prior study. 2. Mild cardiomegaly with moderate severity coronary artery calcification. Electronically Signed   By: Virgina Norfolk M.D.   On: 01/07/2022 00:12     Assessment and Plan:   Bradycardia - in the setting of sepsis from PNA and hypothermia - patient has baseline bradycardia with known 1st degree AV bock, intermittent Mobitz type 1 second degree AV block and 2:1 AV block on heart monitor 11/2021 - he is not on BB/CBB at baseline - tele shows SB HR upper 30s -50s. No pauses or high grade AV block noted - wife reports persistent weakness, dizziness and lightheadedness, however he has multiple admissions since the start of the year, and now is  admitted with sepsis PNA  - Keep Mag>2 and K>4 - TSH pending - continue to monitor telemetry. can be further evaluated once he has recovered from acute infection  Chest pain - presented with chest pain in the setting of PNA - HS trop negative x 2 - EKG with no ischemic changes - Chest CT showed coronary artery calcifications - No ASA with Eliquis - continue statin - No BB with bradycardia  Sepsis from PNA - CXR and chest CTA showed possible PNA - IV abx per IM - BC pending  Acute Anemia - Hgb 7.9, Hgb was 10.1 on 5/4 - he is on Eliquis - check FOBT - transfuse Hgb<7  Paroxysmal Afib - in SR/SB - continue Eliquis 75m BID  HTN - BP is good  HLD - LDL 53 - continue Lipitor 854mdaily   For questions or updates, please contact [facility] Please consult [email or web address] for contact info under    Signed,  H Ninfa MeekerPA-C  01/07/2022 7:33 AM

## 2022-01-07 NOTE — Assessment & Plan Note (Addendum)
--  BP stable.  Mobitz type I second-degree AV block, 2-1 AV block on Zio monitor. --Seen by cardiology and EP.  Cardiac MRI with no infiltrative cardiomyopathy. Plan: --cardiac catheterization/left heart catheterization today -defer decision on pacemaker to EP pending results of catheterization

## 2022-01-08 ENCOUNTER — Encounter: Payer: Medicaid Other | Admitting: Physical Therapy

## 2022-01-08 ENCOUNTER — Encounter
Admission: EM | Disposition: A | Discharge status: Home / Self Care | Payer: Self-pay | Source: Home / Self Care | Attending: Hospitalist

## 2022-01-08 DIAGNOSIS — R001 Bradycardia, unspecified: Secondary | ICD-10-CM | POA: Diagnosis not present

## 2022-01-08 DIAGNOSIS — R6520 Severe sepsis without septic shock: Secondary | ICD-10-CM

## 2022-01-08 DIAGNOSIS — D649 Anemia, unspecified: Secondary | ICD-10-CM

## 2022-01-08 DIAGNOSIS — I480 Paroxysmal atrial fibrillation: Secondary | ICD-10-CM | POA: Diagnosis not present

## 2022-01-08 DIAGNOSIS — N179 Acute kidney failure, unspecified: Secondary | ICD-10-CM | POA: Diagnosis not present

## 2022-01-08 DIAGNOSIS — N1832 Chronic kidney disease, stage 3b: Secondary | ICD-10-CM

## 2022-01-08 DIAGNOSIS — A419 Sepsis, unspecified organism: Secondary | ICD-10-CM | POA: Diagnosis not present

## 2022-01-08 LAB — GLUCOSE, CAPILLARY: Glucose-Capillary: 71 mg/dL (ref 70–99)

## 2022-01-08 LAB — BASIC METABOLIC PANEL
Anion gap: 7 (ref 5–15)
BUN: 21 mg/dL (ref 8–23)
CO2: 23 mmol/L (ref 22–32)
Calcium: 8.7 mg/dL — ABNORMAL LOW (ref 8.9–10.3)
Chloride: 115 mmol/L — ABNORMAL HIGH (ref 98–111)
Creatinine, Ser: 1.83 mg/dL — ABNORMAL HIGH (ref 0.61–1.24)
GFR, Estimated: 41 mL/min — ABNORMAL LOW (ref >60)
Glucose, Bld: 70 mg/dL (ref 70–99)
Potassium: 3.6 mmol/L (ref 3.5–5.1)
Sodium: 145 mmol/L (ref 135–145)

## 2022-01-08 LAB — CBC
HCT: 28.3 % — ABNORMAL LOW (ref 39.0–52.0)
Hemoglobin: 8.7 g/dL — ABNORMAL LOW (ref 13.0–17.0)
MCH: 27.4 pg (ref 26.0–34.0)
MCHC: 30.7 g/dL (ref 30.0–36.0)
MCV: 89 fL (ref 80.0–100.0)
Platelets: 206 10*3/uL (ref 150–400)
RBC: 3.18 MIL/uL — ABNORMAL LOW (ref 4.22–5.81)
RDW: 17.6 % — ABNORMAL HIGH (ref 11.5–15.5)
WBC: 4.1 10*3/uL (ref 4.0–10.5)
nRBC: 0.7 % — ABNORMAL HIGH (ref 0.0–0.2)

## 2022-01-08 LAB — IRON AND TIBC
Iron: 42 ug/dL — ABNORMAL LOW (ref 45–182)
Saturation Ratios: 14 % — ABNORMAL LOW (ref 17.9–39.5)
TIBC: 291 ug/dL (ref 250–450)
UIBC: 249 ug/dL

## 2022-01-08 LAB — PROCALCITONIN: Procalcitonin: 0.13 ng/mL

## 2022-01-08 LAB — FOLATE: Folate: 14.1 ng/mL (ref >5.9)

## 2022-01-08 LAB — APTT: aPTT: >200 seconds (ref 24–36)

## 2022-01-08 LAB — MAGNESIUM: Magnesium: 2 mg/dL (ref 1.7–2.4)

## 2022-01-08 SURGERY — LEFT HEART CATH AND CORONARY ANGIOGRAPHY
Anesthesia: Moderate Sedation

## 2022-01-08 MED ORDER — AZITHROMYCIN 500 MG PO TABS
500.0000 mg | ORAL_TABLET | Freq: Every day | ORAL | Status: DC
Start: 2022-01-09 — End: 2022-01-09
  Administered 2022-01-09: 500 mg via ORAL
  Filled 2022-01-08: qty 1

## 2022-01-08 MED ORDER — ASPIRIN 81 MG PO CHEW
81.0000 mg | CHEWABLE_TABLET | ORAL | Status: AC
  Administered 2022-01-09: 81 mg via ORAL
  Filled 2022-01-08: qty 1

## 2022-01-08 MED ORDER — HEPARIN (PORCINE) 25000 UT/250ML-% IV SOLN
950.0000 [IU]/h | INTRAVENOUS | Status: DC
  Administered 2022-01-08: 950 [IU]/h via INTRAVENOUS
  Filled 2022-01-08: qty 250

## 2022-01-08 MED ORDER — HEPARIN BOLUS VIA INFUSION
3500.0000 [IU] | Freq: Once | INTRAVENOUS | Status: AC
  Administered 2022-01-08: 3500 [IU] via INTRAVENOUS
  Filled 2022-01-08: qty 3500

## 2022-01-08 MED ORDER — HEPARIN (PORCINE) 25000 UT/250ML-% IV SOLN
800.0000 [IU]/h | INTRAVENOUS | Status: DC
  Administered 2022-01-08: 800 [IU]/h via INTRAVENOUS

## 2022-01-08 NOTE — Assessment & Plan Note (Addendum)
--  anemia workup showed iron def --start oral iron supplement

## 2022-01-08 NOTE — Progress Notes (Signed)
PHARMACIST - PHYSICIAN COMMUNICATION  CONCERNING: Antibiotic IV to Oral Route Change Policy  RECOMMENDATION: This patient is receiving azithromycin by the intravenous route.  Based on criteria approved by the Pharmacy and Therapeutics Committee, the antibiotic(s) is/are being converted to the equivalent oral dose form(s).   DESCRIPTION: These criteria include: Patient being treated for a respiratory tract infection, urinary tract infection, cellulitis or clostridium difficile associated diarrhea if on metronidazole The patient is not neutropenic and does not exhibit a GI malabsorption state The patient is eating (either orally or via tube) and/or has been taking other orally administered medications for a least 24 hours The patient is improving clinically and has a Tmax < 100.5  If you have questions about this conversion, please contact the [place]  01/08/22

## 2022-01-08 NOTE — NC FL2 (Signed)
[place] LEVEL OF CARE SCREENING TOOL     IDENTIFICATION  Patient Name: Evan PREUSS Sr. Birthdate: 1955/12/09 Sex: male Admission Date (Current Location): 01/06/2022  [facility] and [place] Number:  Engineering geologist and Address:  [facility], [address] [place], [place] [address]      Provider Number: [id number]  Attending Physician Name and Address:  Enzo Bi, MD  Relative Name and Phone Number:  Rise Paganini (spouse) [phone]    Current Level of Care: Hospital Recommended Level of Care: [place] Prior Approval Number:    Date Approved/Denied:   PASRR Number: [id number]  Discharge Plan: SNF    Current Diagnoses: Patient Active Problem List   Diagnosis Date Noted   Symptomatic bradycardia 01/07/2022   BPH (benign prostatic hyperplasia) 01/07/2022   GERD without esophagitis 01/07/2022   Paroxysmal atrial fibrillation ([place]) 01/07/2022   Dyslipidemia 01/07/2022   Diabetes mellitus ([place]) 01/01/2022   Acute renal failure ([place]) 01/01/2022   Elevated liver enzymes 12/28/2021   Pneumonia 11/26/2021   Sepsis due to pneumonia ([place]) 11/26/2021   CAP (community acquired pneumonia) 11/26/2021   AKI (acute kidney injury) ([place]) 11/26/2021   Sinus pause    Bradycardia 10/16/2021   Hypertensive emergency without congestive heart failure 09/14/2021   Hypertension    Unstable angina ([place])    Diabetes mellitus without complication ([place]) [id number]   Primary hypertension 06/27/2021   Diabetic ulcer of toe of left foot associated with type 2 diabetes mellitus, limited to breakdown of skin ([place]) 06/27/2021   Need for pneumococcal vaccination 06/20/2021   Cerebrovascular accident (CVA) ([place]) 06/05/2021   Need for influenza vaccination 06/02/2021   Constipation 06/02/2021   Insomnia 06/02/2021   Gastroparesis 02/26/2021   Moderate major depression ([place]) 02/26/2021   Muscle spasm of left lower extremity 02/26/2021    History of stroke 02/26/2021   Type 2 diabetes mellitus with hyperglycemia ([place]) [id number]   Diastolic dysfunction    History of diabetes with ketoacidosis 09/20/2020   CKD (chronic kidney disease) stage 2, GFR 60-89 ml/min 06/27/2020   Monitoring for anticoagulant use 06/27/2020   Lower extremity edema 05/16/2020   Atrial fibrillation, chronic ([place]) 10/26/2019   Anemia of chronic disease 09/05/2019   Acute kidney injury superimposed on CKD ([place]) 09/05/2019   Coagulopathy ([place]) 06/12/2019   Gastroesophageal reflux disease    Hemiplegia and hemiparesis following cerebral infarction affecting left non-dominant side ([place]) 11/25/2018   RBBB 10/15/2018   Dysphagia, post-stroke    TIA (transient ischemic attack) 09/01/2018   Microalbuminuria 07/29/2018   Hyperlipidemia LDL goal <70 02/11/2017   Recurrent strokes ([place]) 11/10/2016   Essential hypertension 11/10/2016   Uncontrolled type 2 diabetes mellitus with hyperglycemia, with long-term current use of insulin ([place]) 11/10/2016   Tobacco abuse 08/28/2013   Peripheral neuropathy 08/28/2013    Orientation RESPIRATION BLADDER Height & Weight     Self, Time, Situation, Place  Normal Incontinent, External catheter Weight: 148 lb 13 oz (67.5 kg) Height:  5\' 6"  (167.6 cm)  BEHAVIORAL SYMPTOMS/MOOD NEUROLOGICAL BOWEL NUTRITION STATUS      Continent Diet (see discharge summary)  AMBULATORY STATUS COMMUNICATION OF NEEDS Skin   Limited Assist Verbally Normal                       Personal Care Assistance Level of Assistance  Bathing, Dressing, Feeding, Total care Bathing Assistance: Limited assistance Feeding assistance: Independent Dressing Assistance: Limited assistance Total Care Assistance: Limited assistance  Functional Limitations Info  Sight, Hearing, Speech Sight Info: Adequate Hearing Info: Adequate Speech Info: Adequate    SPECIAL CARE FACTORS FREQUENCY  PT (By licensed PT), OT (By licensed OT)     PT Frequency: min  4x weekly OT Frequency: min 4x weekly            Contractures Contractures Info: Not present    Additional Factors Info  Code Status, Allergies Code Status Info: full Allergies Info: no [place]won allergies           Current Medications (01/08/2022):  This is the current hospital active medication list Current Facility-Administered Medications  Medication Dose Route Frequency Provider Last Rate Last Admin    stroke: early stages of recovery book   Does not apply Once Mansy, Jan A, MD       acetaminophen (TYLENOL) tablet 650 mg  650 mg Oral Q6H PRN Mansy, Jan A, MD       Or   acetaminophen (TYLENOL) suppository 650 mg  650 mg Rectal Q6H PRN Mansy, Jan A, MD       albuterol (PROVENTIL) (2.5 MG/3ML) 0.083% nebulizer solution 2.5 mg  2.5 mg Inhalation Q6H PRN Mansy, Jan A, MD       atorvastatin (LIPITOR) tablet 80 mg  80 mg Oral QPM Mansy, Jan A, MD   80 mg at 01/07/22 1700   [START ON 01/09/2022] azithromycin (ZITHROMAX) tablet 500 mg  500 mg Oral Daily Benita Gutter, RPH       cefTRIAXone (ROCEPHIN) 2 g in sodium chloride 0.9 % 100 mL IVPB  2 g Intravenous Q24H Mansy, Jan A, MD   Stopped at 01/08/22 [id number]   Chlorhexidine Gluconate Cloth 2 % PADS 6 each  6 each Topical Daily Mansy, Jan A, MD   6 each at 01/08/22 0918   dapagliflozin propanediol (FARXIGA) tablet 10 mg  10 mg Oral QAC breakfast Mansy, Jan A, MD   10 mg at 01/08/22 [id number]   heparin ADULT infusion 100 units/mL (25000 units/274mL)  950 Units/hr Intravenous Continuous Benita Gutter, RPH 9.5 mL/hr at 01/08/22 0923 950 Units/hr at 01/08/22 0923   magnesium hydroxide (MILK OF MAGNESIA) suspension 30 mL  30 mL Oral Daily PRN Mansy, Jan A, MD       mirtazapine (REMERON) tablet 7.5 mg  7.5 mg Oral QHS Mansy, Jan A, MD   7.5 mg at 01/07/22 2100   nitroGLYCERIN (NITROSTAT) SL tablet 0.4 mg  0.4 mg Sublingual Q5 min PRN Mansy, Jan A, MD   0.4 mg at 01/07/22 0519   ondansetron (ZOFRAN) tablet 4 mg  4 mg Oral Q6H PRN Mansy, Jan A, MD        Or   ondansetron [facility]) injection 4 mg  4 mg Intravenous Q6H PRN Mansy, Jan A, MD       pantoprazole (PROTONIX) EC tablet 80 mg  80 mg Oral Daily Mansy, Jan A, MD   80 mg at 01/08/22 [id number]   tamsulosin (FLOMAX) capsule 0.4 mg  0.4 mg Oral Daily Mansy, Jan A, MD   0.4 mg at 01/08/22 [id number]   traZODone (DESYREL) tablet 25 mg  25 mg Oral QHS PRN Mansy, Jan A, MD   25 mg at 01/07/22 2100     Discharge Medications: Please see discharge summary for a list of discharge medications.  Relevant Imaging Results:  Relevant Lab Results:   Additional Information [id number]  Alberteen Sam, LCSW

## 2022-01-08 NOTE — Progress Notes (Signed)
Progress Note  Patient Name: Evan ADCOCK Sr. Date of Encounter: 01/08/2022  Primary Cardiologist: End Primary Electrophysiologist: Quentin Ore  Subjective   Somnolent this morning. Wife at bedside. He reports he feels well and is without angina or symptoms of decompensation. Cardiac MRI yesterday without evidence of infiltrative cardiomyopathy. Wife and patient would like to move forward with cardiac cath, however, he has been continued on Eliquis. Telemetry shows heart rates ranging from the 60s to 80s bpm. EP following.   Inpatient Medications    Scheduled Meds:   stroke: early stages of recovery book   Does not apply Once   apixaban  5 mg Oral BID   atorvastatin  80 mg Oral QPM   [START ON 01/09/2022] azithromycin  500 mg Oral Daily   Chlorhexidine Gluconate Cloth  6 each Topical Daily   dapagliflozin propanediol  10 mg Oral QAC breakfast   mirtazapine  7.5 mg Oral QHS   pantoprazole  80 mg Oral Daily   tamsulosin  0.4 mg Oral Daily   Continuous Infusions:  cefTRIAXone (ROCEPHIN)  IV 2 g (01/08/22 0630)   PRN Meds: acetaminophen **OR** acetaminophen, albuterol, magnesium hydroxide, nitroGLYCERIN, ondansetron **OR** ondansetron (ZOFRAN) IV, traZODone   Vital Signs    Vitals:   01/08/22 0614 01/08/22 0622 01/08/22 0631 01/08/22 0633  BP:      Pulse: 84 81 82 85  Resp: 13 11 13 11   Temp: 99.7 F (37.6 C) 99.7 F (37.6 C) 99.5 F (37.5 C) 99.5 F (37.5 C)  TempSrc:      SpO2: 100% 100% 100% 100%  Weight:      Height:        Intake/Output Summary (Last 24 hours) at 01/08/2022 0804 Last data filed at 01/08/2022 0500 Gross per 24 hour  Intake 1270.03 ml  Output 1550 ml  Net -279.97 ml   Filed Weights   01/06/22 1614 01/07/22 0300  Weight: 65.7 kg 67.5 kg    Telemetry    SR with rates ranging from the 60s to 80s bpm - Personally Reviewed  ECG    No new tracings - Personally Reviewed  Physical Exam   GEN: No acute distress, somnolent though will open  eyes to command and converse.   Neck: No JVD. Cardiac: RRR, no murmurs, rubs, or gallops.  Respiratory: Clear to auscultation bilaterally.  GI: Soft, nontender, non-distended.   MS: No edema; No deformity. Neuro:  Alert and oriented x 3; Nonfocal.  Psych: Normal affect.  Labs    Chemistry Recent Labs  Lab 01/01/22 1504 01/06/22 1319 01/06/22 1621 01/06/22 2015 01/07/22 0321 01/08/22 0430  NA 144 138 137  --  141 145  K 4.0 3.9 3.5  --  3.9 3.6  CL 107* 104 106  --  113* 115*  CO2 22 21 23   --  23 23  GLUCOSE 116* 112* 174*  --  97 70  BUN 23 22 25*  --  25* 21  CREATININE 1.72* 1.48* 1.65*  --  1.57* 1.83*  CALCIUM 8.9 8.7 8.8*  --  8.5* 8.7*  PROT 6.8 6.7  --  7.1  --   --   ALBUMIN 4.0 4.0  --  3.4*  --   --   AST 63* 37  --  41  --   --   ALT 105* 77*  --  77*  --   --   ALKPHOS 194* 181*  --  140*  --   --  BILITOT <0.2 <0.2  --  0.4  --   --   GFRNONAA  --   --  46*  --  49* 41*  ANIONGAP  --   --  8  --  5 7     Hematology Recent Labs  Lab 01/06/22 1621 01/07/22 0321 01/08/22 0430  WBC 3.0* 3.7* 4.1  RBC 3.11* 2.87* 3.18*  HGB 8.6* 7.9* 8.7*  HCT 27.6* 25.3* 28.3*  MCV 88.7 88.2 89.0  MCH 27.7 27.5 27.4  MCHC 31.2 31.2 30.7  RDW 17.2* 17.1* 17.6*  PLT 212 181 206    Cardiac EnzymesNo results for input(s): TROPONINI in the last 168 hours. No results for input(s): TROPIPOC in the last 168 hours.   BNPNo results for input(s): BNP, PROBNP in the last 168 hours.   DDimer No results for input(s): DDIMER in the last 168 hours.   Radiology    DG Chest 2 View  Result Date: 01/06/2022 IMPRESSION: Signs of atelectasis but with more confluent area of consolidation at the LEFT lung base that may represent volume loss or developing pneumonia. Electronically Signed   By: Zetta Bills M.D.   On: 01/06/2022 16:45   CT Head Wo Contrast  Result Date: 01/07/2022 IMPRESSION: 1. No acute intracranial abnormality. 2. Generalized cerebral atrophy with chronic  white matter small vessel ischemic changes. Electronically Signed   By: Virgina Norfolk M.D.   On: 01/07/2022 00:08   CT Chest Wo Contrast  Result Date: 01/07/2022 IMPRESSION: 1. Mild right basilar and left lower lobe linear scarring, atelectasis and/or infiltrate. This is mildly increased in severity when compared to the prior study. 2. Mild cardiomegaly with moderate severity coronary artery calcification. Electronically Signed   By: Virgina Norfolk M.D.   On: 01/07/2022 00:12   Cardiac Studies   Cardiac MRI 01/07/2022: IMPRESSION: 1.  Normal LV size and function, LVEF 51% 2.  Normal RV size and function. 3.  No delayed enhancement or scar noted 4.  Normal native T1 value, elevated ECV. 5. With native T1 value being normal, elevated ECV likely influenced by motion artifact. 6. No findings to suggest infiltrative disease such as sarcoid or amyloid. __________  Elwyn Reach patch 10/2021: The patient was monitored for 14 days. The predominant rhythm was sinus with an average rate of 56 bpm (range 27-126 bpm). There were rare PACs and PVCs. Episodes of Mobitz type I second-degree AV block and 2:1 AV block were observed. No prolonged pauses (greater than 3 seconds) was observed. Patient triggered events corresponded to sinus rhythm and PVCs.   Predominantly sinus rhythm with episodes of Mobitz type I second-degree AV block and 2:1 AV block. __________  2D echo 10/17/2021: 1. Left ventricular ejection fraction, by estimation, is 50 to 55%. The  left ventricle has low normal function. The left ventricle has no regional  wall motion abnormalities. Left ventricular diastolic parameters are  consistent with Grade I diastolic  dysfunction (impaired relaxation).   2. Right ventricular systolic function is normal. The right ventricular  size is normal. There is normal pulmonary artery systolic pressure. The  estimated right ventricular systolic pressure is [id number] mmHg.   3. The mitral valve is  normal in structure. Mild mitral valve  regurgitation. No evidence of mitral stenosis.   4. The aortic valve is tricuspid. Aortic valve regurgitation is not  visualized. No aortic stenosis is present.   5. The inferior vena cava is normal in size with greater than 50%  respiratory variability, suggesting right atrial  pressure of 3 mmHg. __________  Elwyn Reach patch 10/2019: The patient was monitored for 5 days, 15 hours. The predominant rhythm was sinus with an average rate of 62 bpm (range 38-138 bpm). There were rare PAC's and PVC's. No sustained arrhythmia or prolonged pause was identified. There were no patient triggered events.   Predominantly sinus rhythm without significant arrhythmia.  Of note, preceding 14 day monitor showed paroxysmal atrial fibrillation. __________  Elwyn Reach patch 09/2019: The patient was monitored for 14 days. The predominant rhythm was sinus with an average rate of 62 bpm (range 38 to 124 bpm in sinus). Rare PACs and PVCs were noted. Paroxysmal atrial fibrillation occurred, with the longest episode lasting 1 hours, 7 minutes. Average ventricular rate while in atrial fibrillation was 90 bpm with a range of 51 to 188 bpm. Atrial fibrillation burden was less than 1%. There was no prolonged pause. Patient triggered event corresponds to sinus rhythm.   Predominantly sinus rhythm with paroxysmal atrial fibrillation (longest episode lasting 1 hour, 7 minutes; <1% burden).  Rare PACs and PVCs also noted. __________  Carlton Adam MPI 09/06/2019: This is a low risk study. The calculated left ventricular ejection fraction is moderately decreased (33%). This is likely a gating artifact as Visual EF appears normal at about 55%. recommend correlation with another imaging modality such as echocardiogram. There was no evidence for ischemia Mild diaphragmatic attenuation noted ___________  2D echo 09/05/2019: 1. Left ventricular ejection fraction, by visual estimation, is 55 to  60%.  The left ventricle has normal function. There is borderline left  ventricular hypertrophy.   2. Left ventricular diastolic parameters are consistent with Grade I  diastolic dysfunction (impaired relaxation).   3. The left ventricle has no regional wall motion abnormalities.   4. Global right ventricle has low normal systolic function.The right  ventricular size is normal. No increase in right ventricular wall  thickness.   5. Left atrial size was normal.   6. Right atrial size was normal.   7. The mitral valve is degenerative. Mild mitral valve regurgitation. No  evidence of mitral stenosis.   8. The tricuspid valve is grossly normal.   9. The tricuspid valve is grossly normal. Tricuspid valve regurgitation  is not demonstrated.  10. The aortic valve is tricuspid. Aortic valve regurgitation is not  visualized. No significant stenosis suspected, though evaluation is  limited by lack of spectral Doppler images.  11. The pulmonic valve was not well visualized. Pulmonic valve  regurgitation is not visualized.  12. Mildly elevated pulmonary artery systolic pressure.  13. The tricuspid regurgitant velocity is 2.94 m/s, and with an assumed  right atrial pressure of 3 mmHg, the estimated right ventricular systolic  pressure is mildly elevated at 37.6 mmHg.  14. The inferior vena cava is normal in size with greater than 50%  respiratory variability, suggesting right atrial pressure of 3 mmHg.  15. The interatrial septum was not well visualized. ___________  2D echo 09/02/2018: - Left ventricle: The cavity size was normal. There was mild    concentric hypertrophy. Systolic function was normal. The    estimated ejection fraction was 65%. Wall motion was normal;    there were no regional wall motion abnormalities. Doppler    parameters are consistent with abnormal left ventricular    relaxation (grade 1 diastolic dysfunction).  - Mitral valve: There was mild regurgitation.   Impressions:    - No cardiac source of emboli was indentified.  __________  Carotid artery ultrasound 09/01/2018: IMPRESSION: 1.  Minimal to moderate amount of right-sided atherosclerotic plaque, not resulting in a hemodynamically significant stenosis. 2. Normal sonographic evaluation of the left carotid system. __________  2D echo 11/10/2016: - Left ventricle: The cavity size was normal. Wall thickness was    increased in a pattern of mild LVH. Systolic function was normal.    The estimated ejection fraction was in the range of 55% to 65%.  Patient Profile     66 y.o. 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   Assessment & Plan    1. Symptomatic bradycardia/history of Mobtiz type I 2nd degree AV block/coronary artery calcification with unstable angina: -Chest pain free currently -Troponin negative -Maintaining sinus rhythm with heart rates in the 60s to 80s bpm -Avoid AV nodal blocking medications -TSH normal, magnesium at goal -Cardiac MRI without evidence of infiltrative cardiomyopathy  -Unable to perform LHC today on Eliquis -NPO at midnight -LHC planned for 5/26 -Monitor on tele -Follow up with EP recommendations after LHC  2. PAF: -Maintaining sinus rhythm -No longer on beta blocker given prior Mobitzt type I 2nd degree AV block -CHADS2VASc 5 (HTN, DM, CVA x 2, vascular disease) -Hold Eliquis -Start heparin gtt -Resume PTA Eliquis once it is clear no further inpatient procedure will be needed  3. Recurrent CVA with left-sided hemiparesis: -No new deficits  -On Eliquis as outpatient, heparin gtt while inpatient  4. CKD stage III: -Relatively stable -Pre-cath IV fluids  5. Anemia of chronic  disease: -Stable -Monitor  6. HLD: -LDL 35 -PTA Lipitor   Shared Decision Making/Informed Consent{  The risks [stroke (1 in 1000), death (1 in 1000), kidney failure [usually temporary] (1 in 500), bleeding (1 in 200), allergic reaction [possibly serious] (1 in 200)], benefits (diagnostic support and management of coronary artery disease) and alternatives of a cardiac catheterization were discussed in detail with Mr. Greger and he is willing to proceed.   For questions or updates, please contact [place] Please consult [email or web address] for contact info under Cardiology/STEMI.    Signed, Christell Faith, PA-C [place] Pager: [phone] 01/08/2022, 8:04 AM

## 2022-01-08 NOTE — Progress Notes (Signed)
OT Cancellation Note  Patient Details Name: Evan FRASIER Sr. MRN: [id number] DOB: [birth year]   Cancelled Treatment:    Reason Eval/Treat Not Completed: Other (comment) (Per RN pt with increased confusion, currently recieving hygiene from NT/RN. Hold OT tx at this time. OT to re-attempt as able.Oleta Mouse, OTD OTR/L  01/08/22, 2:41 PM

## 2022-01-08 NOTE — Progress Notes (Signed)
  Progress Note   Patient: Evan Townsend [id number] DOB: [birth year] DOA: 01/06/2022     1 DOS: the patient was seen and examined on 01/08/2022   Brief hospital course: No notes on file  Assessment and Plan: * Symptomatic bradycardia --BP stable.  Mobitz type I second-degree AV block, 2-1 AV block on Zio monitor. --Seen by cardiology and EP.  Cardiac MRI yesterday with no infiltrative cardiomyopathy. Plan: --cardiac catheterization/left heart catheterization scheduled for tomorrow with Dr. Saunders Revel -defer decision on pacemaker to EP pending results of catheterization  Sepsis due to pneumonia ([place]) - mild leukopenia and hypothermia, however, no cough or hypoxia.  CT chest showed Mild right basilar and left lower lobe linear scarring, atelectasis and/or infiltrate. Plan: --cont ceftriaxone and azithro for now  Hypertension --BP varied widely --hold BP meds for now  Acute on chronic anemia --anemia workup  Dyslipidemia - We will continue statin therapy.  Paroxysmal atrial fibrillation (HCC) --currently in NSR --on heparin gtt in place of Eliquis for upcoming procedure  GERD without esophagitis - We will continue PPI therapy  BPH (benign prostatic hyperplasia) - continue his Flomax.  History of stroke --with left-sided hemiparesis and dysarthria  --cont ASA and statin         Subjective:  Pt reported no chest pain or epigastric pain today.  Ate his meal.    Later, RN reported pt seemed more drowsy and confused.   Physical Exam:  Constitutional: NAD, alert, oriented to person and place, slurred speech HEENT: conjunctivae and lids normal, EOMI CV: No cyanosis.   RESP: normal respiratory effort, on RA SKIN: warm, dry   Data Reviewed:  Family Communication: wife updated at bedside today  Disposition: Status is: Inpatient   Planned Discharge Destination: Rehab    Time spent: 50 minutes  Author: Enzo Bi, MD 01/08/2022 7:03 PM  For on call  review [email or web address].

## 2022-01-08 NOTE — Consult Note (Signed)
ANTICOAGULATION CONSULT NOTE  Pharmacy Consult for IV Heparin Indication: atrial fibrillation (apixaban previously, last dose 5/24 at 2100)  Patient Measurements: Height: 5\' 6"  (167.6 cm) Weight: 67.5 kg (148 lb 13 oz) IBW/kg (Calculated) : 63.8 Heparin Dosing Weight: 67.5 kg  Labs: Recent Labs    01/06/22 1621 01/06/22 2015 01/07/22 0033 01/07/22 0320 01/07/22 0321 01/08/22 0430 01/08/22 1605  HGB 8.6*  --   --   --  7.9* 8.7*  --   HCT 27.6*  --   --   --  25.3* 28.3*  --   PLT 212  --   --   --  181 206  --   APTT  --   --  42*  --   --   --  >200*  LABPROT  --   --  14.6  --   --   --   --   INR  --   --  1.2  --   --   --   --   CREATININE 1.65*  --   --   --  1.57* 1.83*  --   TROPONINIHS 7 7  --  8  --   --   --      Estimated Creatinine Clearance: 36.8 mL/min (A) (by C-G formula based on SCr of 1.83 mg/dL (H)).   Medical History: Past Medical History:  Diagnosis Date   Carotid arterial disease (HCC)    a. 08/2018 Carotid U/S: min-mod RICA atherosclerosis w/o hemodynamically significant stenosis. Nl LICA.   Diabetes 1.5, managed as type 2 (HCC)    Diastolic dysfunction    a. 08/2018 Echo: EF 65%. No rwma. Gr1 DD. Mild MR.   Diastolic dysfunction    a. 08/2019 Echo: EF 55-60%, Gr1 DD. No rwma. Mild MR. RVSP 37.70mmHg.   Hypercholesterolemia    Hypertension    PAF (paroxysmal atrial fibrillation) (HCC)    a. 10/2019 Event Monitor: PAF; b. CHA2DS2VASc = 5-->Eliquis.   Poorly controlled diabetes mellitus (HCC)    a. 04/2019 A1c 13.8.   Recurrent strokes (HCC)    a. 10/2016 MRI/A: Acute 41mm R thalamic infarct, ? subacute infarct of R corona radiata; b. 08/2017 MRI/A: Acute 74mm lateral L thalamic infarct. Other more remote lacunar infarcts of thalami bilat. Small vessel dzs; c. 08/2018 MRI/A: Acute lacunar infarct of the post limb of R internal capsule; d. 08/2019 MRI Acute CVA of L paramedian pons adn R cerebellar hemisphere.   Tobacco abuse     Medications:  Apixaban  5 mg BID (last dose 5/24 at 2100)  Assessment: Patient is a 66 y/o M with medical history as above and including Afib and recurrent strokes on apixaban who is admitted with symptomatic bradycardia in the setting of pneumonia. Cardiology following and plan is for cardiac catheterization. Apixaban has been placed on hold. Pharmacy consulted for management of heparin infusion for Afib.   CBC is stable. aPTT checked yesterday elevated to 42s. INR 1.2.   5/25 1605 aPTT > 200 sec, Supratherapeutic   Goal of Therapy:  aPTT 66 - 102 seconds Monitor platelets by anticoagulation protocol: Yes   Plan:  5/25 1605 aPTT > 200 sec, Supratherapeutic  Discussed with RN, Hold infusion x 1 hour then Resume at reduced rate of 800 units/hr  Check aPTT/HL 6 hours following following infusion resuming  Follow aPTT until correlation established with heparin level Daily CBC per protocol while on IV heparin  6/25, PharmD, BCPS Clinical Pharmacist   01/08/2022,5:20  PM   

## 2022-01-08 NOTE — Assessment & Plan Note (Signed)
--  with left-sided hemiparesis and dysarthria  --cont ASA and statin

## 2022-01-08 NOTE — Consult Note (Signed)
ANTICOAGULATION CONSULT NOTE  Pharmacy Consult for IV Heparin Indication: atrial fibrillation (apixaban previously, last dose 5/24 at 2100)  Patient Measurements: Height: 5\' 6"  (167.6 cm) Weight: 67.5 kg (148 lb 13 oz) IBW/kg (Calculated) : 63.8 Heparin Dosing Weight: 67.5 kg  Labs: Recent Labs    01/06/22 1621 01/06/22 2015 01/07/22 0033 01/07/22 0320 01/07/22 0321 01/08/22 0430  HGB 8.6*  --   --   --  7.9* 8.7*  HCT 27.6*  --   --   --  25.3* 28.3*  PLT 212  --   --   --  181 206  APTT  --   --  42*  --   --   --   LABPROT  --   --  14.6  --   --   --   INR  --   --  1.2  --   --   --   CREATININE 1.65*  --   --   --  1.57* 1.83*  TROPONINIHS 7 7  --  8  --   --     Estimated Creatinine Clearance: 36.8 mL/min (A) (by C-G formula based on SCr of 1.83 mg/dL (H)).   Medical History: Past Medical History:  Diagnosis Date   Carotid arterial disease (HCC)    a. 08/2018 Carotid U/S: min-mod RICA atherosclerosis w/o hemodynamically significant stenosis. Nl LICA.   Diabetes 1.5, managed as type 2 (HCC)    Diastolic dysfunction    a. 08/2018 Echo: EF 65%. No rwma. Gr1 DD. Mild MR.   Diastolic dysfunction    a. 08/2019 Echo: EF 55-60%, Gr1 DD. No rwma. Mild MR. RVSP 37.41mmHg.   Hypercholesterolemia    Hypertension    PAF (paroxysmal atrial fibrillation) (HCC)    a. 10/2019 Event Monitor: PAF; b. CHA2DS2VASc = 5-->Eliquis.   Poorly controlled diabetes mellitus (HCC)    a. 04/2019 A1c 13.8.   Recurrent strokes (HCC)    a. 10/2016 MRI/A: Acute 48mm R thalamic infarct, ? subacute infarct of R corona radiata; b. 08/2017 MRI/A: Acute 51mm lateral L thalamic infarct. Other more remote lacunar infarcts of thalami bilat. Small vessel dzs; c. 08/2018 MRI/A: Acute lacunar infarct of the post limb of R internal capsule; d. 08/2019 MRI Acute CVA of L paramedian pons adn R cerebellar hemisphere.   Tobacco abuse     Medications:  Apixaban 5 mg BID (last dose 5/24 at 2100)  Assessment: Patient  is a 66 y/o M with medical history as above and including Afib and recurrent strokes on apixaban who is admitted with symptomatic bradycardia in the setting of pneumonia. Cardiology following and plan is for cardiac catheterization. Apixaban has been placed on hold. Pharmacy consulted for management of heparin infusion for Afib.   CBC is stable. aPTT checked yesterday elevated to 42s. INR 1.2.   Goal of Therapy:  aPTT 66 - 102 seconds Monitor platelets by anticoagulation protocol: Yes   Plan:  --Heparin 3500 unit IV bolus followed by continuous infusion at 950 units/hr --aPTT 6 hours after initiation of infusion. Follow aPTT until correlation established with heparin level --Daily CBC per protocol while on IV heparin  77 01/08/2022,8:25 AM

## 2022-01-08 NOTE — TOC Initial Note (Signed)
Transition of Care (TOC) - Initial/Assessment Note    Patient Details  Name: Evan KEIR Sr. MRN: [id number] Date of Birth: 1956/07/17  Transition of Care [facility]) CM/SW Contact:    Alberteen Sam, LCSW Phone Number: 01/08/2022, 10:12 AM  Clinical Narrative:                  CSW spoke with patient's spouse Evan Townsend regarding SNF recommendations, she reports being in agreement with preference for [facility].   CSW has sent referral pending bed offer, Evan Townsend updated on potential need for copayments and also need for auth through [employer], she expressed understanding.    Expected Discharge Plan: Skilled Nursing Facility Barriers to Discharge: Continued Medical Work up   Patient Goals and CMS Choice Patient states their goals for this hospitalization and ongoing recovery are:: to go home CMS Medicare.gov Compare Post Acute Care list provided to:: Patient Represenative (must comment) (spouse Evan Townsend) Choice offered to / list presented to : Spouse  Expected Discharge Plan and Services Expected Discharge Plan: [place] arrangements for the past 2 months: Single Family Home                                      Prior Living Arrangements/Services Living arrangements for the past 2 months: Single Family Home Lives with:: Spouse                   Activities of Daily Living      Permission Sought/Granted                  Emotional Assessment              Admission diagnosis:  Healthcare-associated pneumonia [J18.9] Left hemiparesis ([place]) [G81.94] Dysarthria [R47.1] Symptomatic bradycardia [R00.1] Acute kidney injury ([place]) [N17.9] Hypothermia, initial encounter [T68.XXXA] Anemia, unspecified type [D64.9] Leukopenia, unspecified type [D72.819] Sepsis with acute organ dysfunction without septic shock, due to unspecified organism, unspecified type ([place]) [A41.9, R65.20] Patient Active Problem List   Diagnosis Date  Noted   Symptomatic bradycardia 01/07/2022   BPH (benign prostatic hyperplasia) 01/07/2022   GERD without esophagitis 01/07/2022   Paroxysmal atrial fibrillation ([place]) 01/07/2022   Dyslipidemia 01/07/2022   Diabetes mellitus ([place]) 01/01/2022   Acute renal failure ([place]) 01/01/2022   Elevated liver enzymes 12/28/2021   Pneumonia 11/26/2021   Sepsis due to pneumonia ([place]) 11/26/2021   CAP (community acquired pneumonia) 11/26/2021   AKI (acute kidney injury) ([place]) 11/26/2021   Sinus pause    Bradycardia 10/16/2021   Hypertensive emergency without congestive heart failure 09/14/2021   Hypertension    Unstable angina (HCC)    Diabetes mellitus without complication ([place]) [id number]   Primary hypertension 06/27/2021   Diabetic ulcer of toe of left foot associated with type 2 diabetes mellitus, limited to breakdown of skin ([place]) 06/27/2021   Need for pneumococcal vaccination 06/20/2021   Cerebrovascular accident (CVA) ([place]) 06/05/2021   Need for influenza vaccination 06/02/2021   Constipation 06/02/2021   Insomnia 06/02/2021   Gastroparesis 02/26/2021   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   Muscle spasm of left lower extremity 02/26/2021   History of stroke 02/26/2021   Type 2 diabetes mellitus with hyperglycemia ([place]) [id number]   Diastolic dysfunction    History of diabetes with ketoacidosis 09/20/2020   CKD (chronic kidney disease) stage 2, GFR 60-89 ml/min 06/27/2020  Monitoring for anticoagulant use 06/27/2020   Lower extremity edema 05/16/2020   Atrial fibrillation, chronic ([place]) 10/26/2019   Anemia of chronic disease 09/05/2019   Acute kidney injury superimposed on CKD ([place]) 09/05/2019   Coagulopathy ([place]) 06/12/2019   Gastroesophageal reflux disease    Hemiplegia and hemiparesis following cerebral infarction affecting left non-dominant side ([place]) 11/25/2018   RBBB 10/15/2018   Dysphagia, post-stroke    TIA (transient ischemic attack) 09/01/2018   Microalbuminuria  07/29/2018   Hyperlipidemia LDL goal <70 02/11/2017   Recurrent strokes ([place]) 11/10/2016   Essential hypertension 11/10/2016   Uncontrolled type 2 diabetes mellitus with hyperglycemia, with long-term current use of insulin ([place]) 11/10/2016   Tobacco abuse 08/28/2013   Peripheral neuropathy 08/28/2013   PCP:  Charlynne Cousins, MD Pharmacy:   [facility], [place] [address] Phone: [phone] Fax: [phone]  [facility] [address], [place] - [place] [address] Phone: [phone] Fax: [phone]  [facility] [address]), [place] - [place]) [place] [address] Phone: [phone] Fax: [phone]     Social Determinants of Health (SDOH) Interventions    Readmission Risk Interventions     View : No data to display.

## 2022-01-08 NOTE — Progress Notes (Signed)
Pt increasingly drowsy and confused throughout the morning, RN notified MD Billie Ruddy. MD Aware and reviewing pt meds.

## 2022-01-08 NOTE — Progress Notes (Signed)
PT Cancellation Note  Patient Details Name: Evan FAGLEY Sr. MRN: [id number] DOB: 01-31-1956   Cancelled Treatment:    Reason Eval/Treat Not Completed: Other (comment). Per discussion with RN, pt is very lethargic, unable to participate with therapy this time. Of note, plans for cardiac cath next date.    Damaris Geers 01/08/2022, 3:27 PM Greggory Stallion, PT, DPT, GCS [phone]

## 2022-01-09 ENCOUNTER — Encounter
Admission: EM | Disposition: A | Discharge status: Home / Self Care | Payer: Self-pay | Source: Home / Self Care | Attending: Hospitalist

## 2022-01-09 ENCOUNTER — Encounter: Payer: Self-pay | Admitting: Internal Medicine

## 2022-01-09 DIAGNOSIS — G8194 Hemiplegia, unspecified affecting left nondominant side: Secondary | ICD-10-CM

## 2022-01-09 DIAGNOSIS — D649 Anemia, unspecified: Secondary | ICD-10-CM | POA: Diagnosis not present

## 2022-01-09 DIAGNOSIS — R001 Bradycardia, unspecified: Secondary | ICD-10-CM | POA: Diagnosis not present

## 2022-01-09 DIAGNOSIS — N179 Acute kidney failure, unspecified: Secondary | ICD-10-CM | POA: Diagnosis not present

## 2022-01-09 DIAGNOSIS — I10 Essential (primary) hypertension: Secondary | ICD-10-CM

## 2022-01-09 DIAGNOSIS — I25110 Atherosclerotic heart disease of native coronary artery with unstable angina pectoris: Secondary | ICD-10-CM

## 2022-01-09 DIAGNOSIS — A419 Sepsis, unspecified organism: Secondary | ICD-10-CM | POA: Diagnosis not present

## 2022-01-09 DIAGNOSIS — R471 Dysarthria and anarthria: Secondary | ICD-10-CM | POA: Diagnosis not present

## 2022-01-09 HISTORY — PX: LEFT HEART CATH AND CORONARY ANGIOGRAPHY: CATH118249

## 2022-01-09 LAB — HEPARIN LEVEL (UNFRACTIONATED)
Heparin Unfractionated: >1.1 IU/mL — ABNORMAL HIGH (ref 0.30–0.70)
Heparin Unfractionated: >1.1 IU/mL — ABNORMAL HIGH (ref 0.30–0.70)

## 2022-01-09 LAB — BASIC METABOLIC PANEL
Anion gap: 4 — ABNORMAL LOW (ref 5–15)
BUN: 22 mg/dL (ref 8–23)
CO2: 24 mmol/L (ref 22–32)
Calcium: 8.7 mg/dL — ABNORMAL LOW (ref 8.9–10.3)
Chloride: 116 mmol/L — ABNORMAL HIGH (ref 98–111)
Creatinine, Ser: 1.81 mg/dL — ABNORMAL HIGH (ref 0.61–1.24)
GFR, Estimated: 41 mL/min — ABNORMAL LOW (ref >60)
Glucose, Bld: 126 mg/dL — ABNORMAL HIGH (ref 70–99)
Potassium: 3.6 mmol/L (ref 3.5–5.1)
Sodium: 144 mmol/L (ref 135–145)

## 2022-01-09 LAB — CBC
HCT: 26.6 % — ABNORMAL LOW (ref 39.0–52.0)
Hemoglobin: 8.3 g/dL — ABNORMAL LOW (ref 13.0–17.0)
MCH: 28 pg (ref 26.0–34.0)
MCHC: 31.2 g/dL (ref 30.0–36.0)
MCV: 89.9 fL (ref 80.0–100.0)
Platelets: 163 10*3/uL (ref 150–400)
RBC: 2.96 MIL/uL — ABNORMAL LOW (ref 4.22–5.81)
RDW: 17.8 % — ABNORMAL HIGH (ref 11.5–15.5)
WBC: 4.3 10*3/uL (ref 4.0–10.5)
nRBC: 0 % (ref 0.0–0.2)

## 2022-01-09 LAB — URINE CULTURE: Culture: 30000 — AB

## 2022-01-09 LAB — MAGNESIUM: Magnesium: 2.1 mg/dL (ref 1.7–2.4)

## 2022-01-09 LAB — APTT
aPTT: 189 seconds (ref 24–36)
aPTT: 106 seconds — ABNORMAL HIGH (ref 24–36)

## 2022-01-09 LAB — THYROID PANEL WITH TSH
Free Thyroxine Index: 2.3 (ref 1.2–4.9)
T3 Uptake Ratio: 30 % (ref 24–39)
T4, Total: 7.8 ug/dL (ref 4.5–12.0)
TSH: 1.87 u[IU]/mL (ref 0.450–4.500)

## 2022-01-09 LAB — VITAMIN B12: Vitamin B-12: 396 pg/mL (ref 180–914)

## 2022-01-09 SURGERY — LEFT HEART CATH AND CORONARY ANGIOGRAPHY
Anesthesia: Moderate Sedation

## 2022-01-09 MED ORDER — HEPARIN SODIUM (PORCINE) 1000 UNIT/ML IJ SOLN
INTRAMUSCULAR | Status: AC
  Filled 2022-01-09: qty 10

## 2022-01-09 MED ORDER — SODIUM CHLORIDE 0.9% FLUSH
3.0000 mL | Freq: Two times a day (BID) | INTRAVENOUS | Status: DC
  Administered 2022-01-09 – 2022-01-11 (×5): 3 mL via INTRAVENOUS

## 2022-01-09 MED ORDER — POLYSACCHARIDE IRON COMPLEX 150 MG PO CAPS
150.0000 mg | ORAL_CAPSULE | Freq: Every day | ORAL | Status: DC
  Administered 2022-01-09 – 2022-01-11 (×2): 150 mg via ORAL
  Filled 2022-01-09 (×3): qty 1

## 2022-01-09 MED ORDER — HEPARIN (PORCINE) 25000 UT/250ML-% IV SOLN
550.0000 [IU]/h | INTRAVENOUS | Status: DC
  Administered 2022-01-09: 600 [IU]/h via INTRAVENOUS

## 2022-01-09 MED ORDER — LIDOCAINE HCL (PF) 1 % IJ SOLN
INTRAMUSCULAR | Status: DC | PRN
  Administered 2022-01-09: 2 mL

## 2022-01-09 MED ORDER — HYDRALAZINE HCL 20 MG/ML IJ SOLN
10.0000 mg | Freq: Four times a day (QID) | INTRAMUSCULAR | Status: DC | PRN
  Administered 2022-01-09 – 2022-01-10 (×3): 10 mg via INTRAVENOUS
  Filled 2022-01-09 (×2): qty 1

## 2022-01-09 MED ORDER — HEPARIN SODIUM (PORCINE) 1000 UNIT/ML IJ SOLN
INTRAMUSCULAR | Status: DC | PRN
  Administered 2022-01-09: 3500 [IU] via INTRAVENOUS

## 2022-01-09 MED ORDER — SODIUM CHLORIDE 0.9 % WEIGHT BASED INFUSION: 3.0000 mL/kg/h | INTRAVENOUS | Status: DC

## 2022-01-09 MED ORDER — SODIUM CHLORIDE 0.9% FLUSH: 3.0000 mL | INTRAVENOUS | Status: DC | PRN

## 2022-01-09 MED ORDER — AMLODIPINE BESYLATE 10 MG PO TABS
10.0000 mg | ORAL_TABLET | Freq: Every day | ORAL | Status: DC
  Administered 2022-01-11: 10 mg via ORAL
  Filled 2022-01-09 (×2): qty 1

## 2022-01-09 MED ORDER — IOHEXOL 300 MG/ML  SOLN
INTRAMUSCULAR | Status: DC | PRN
  Administered 2022-01-09: 28 mL

## 2022-01-09 MED ORDER — SODIUM CHLORIDE 0.9 % WEIGHT BASED INFUSION: 1.0000 mL/kg/h | INTRAVENOUS | Status: DC

## 2022-01-09 MED ORDER — MIDAZOLAM HCL 2 MG/2ML IJ SOLN
INTRAMUSCULAR | Status: AC
  Filled 2022-01-09: qty 2

## 2022-01-09 MED ORDER — HYDRALAZINE HCL 20 MG/ML IJ SOLN: 10.0000 mg | INTRAMUSCULAR | Status: AC | PRN

## 2022-01-09 MED ORDER — HEPARIN (PORCINE) IN NACL 1000-0.9 UT/500ML-% IV SOLN
INTRAVENOUS | Status: AC
  Filled 2022-01-09: qty 1000

## 2022-01-09 MED ORDER — FENTANYL CITRATE (PF) 100 MCG/2ML IJ SOLN
INTRAMUSCULAR | Status: AC
  Filled 2022-01-09: qty 2

## 2022-01-09 MED ORDER — LIDOCAINE HCL 1 % IJ SOLN
INTRAMUSCULAR | Status: AC
  Filled 2022-01-09: qty 20

## 2022-01-09 MED ORDER — HYDRALAZINE HCL 20 MG/ML IJ SOLN
INTRAMUSCULAR | Status: AC
  Filled 2022-01-09: qty 1

## 2022-01-09 MED ORDER — HEPARIN (PORCINE) 25000 UT/250ML-% IV SOLN
700.0000 [IU]/h | INTRAVENOUS | Status: DC
  Administered 2022-01-09: 550 [IU]/h via INTRAVENOUS
  Filled 2022-01-09: qty 250

## 2022-01-09 MED ORDER — VERAPAMIL HCL 2.5 MG/ML IV SOLN
INTRAVENOUS | Status: AC
  Filled 2022-01-09: qty 2

## 2022-01-09 MED ORDER — SODIUM CHLORIDE 0.9 % IV SOLN: 250.0000 mL | INTRAVENOUS | Status: DC | PRN

## 2022-01-09 MED ORDER — HEPARIN (PORCINE) IN NACL 2000-0.9 UNIT/L-% IV SOLN
INTRAVENOUS | Status: DC | PRN
  Administered 2022-01-09: 1000 mL

## 2022-01-09 MED ORDER — VERAPAMIL HCL 2.5 MG/ML IV SOLN
INTRAVENOUS | Status: DC | PRN
  Administered 2022-01-09: 2.5 mg via INTRAVENOUS

## 2022-01-09 MED ORDER — SODIUM CHLORIDE 0.9% FLUSH
3.0000 mL | Freq: Two times a day (BID) | INTRAVENOUS | Status: DC
Start: 2022-01-09 — End: 2022-01-11
  Administered 2022-01-09 – 2022-01-11 (×4): 3 mL via INTRAVENOUS

## 2022-01-09 MED ORDER — SODIUM CHLORIDE 0.9 % IV SOLN: INTRAVENOUS | Status: AC

## 2022-01-09 SURGICAL SUPPLY — 11 items
CATH 5F 110X4 TIG (CATHETERS) ×1 IMPLANT
CATH INFINITI 5FR ANG PIGTAIL (CATHETERS) ×1 IMPLANT
DRAPE BRACHIAL (DRAPES) ×1 IMPLANT
GLIDESHEATH SLEND SS 6F .021 (SHEATH) ×1 IMPLANT
GUIDEWIRE INQWIRE 1.5J.035X260 (WIRE) IMPLANT
INQWIRE 1.5J .035X260CM (WIRE) ×2
PACK CARDIAC CATH (CUSTOM PROCEDURE TRAY) ×2 IMPLANT
PAD ELECT DEFIB RADIOL ZOLL (MISCELLANEOUS) ×1 IMPLANT
PROTECTION STATION PRESSURIZED (MISCELLANEOUS) ×2
SET ATX SIMPLICITY (MISCELLANEOUS) ×1 IMPLANT
STATION PROTECTION PRESSURIZED (MISCELLANEOUS) IMPLANT

## 2022-01-09 NOTE — Progress Notes (Signed)
Progress Note  Patient Name: Evan Townsend. Date of Encounter: 01/09/2022  Primary Cardiologist: End Primary Electrophysiologist: Quentin Ore  Subjective   More alert this morning when compared to yesterday. No chest pain, dyspnea, palpitations, dizziness, presyncope, or syncope. He is for [facility] later today. EP following.   Inpatient Medications    Scheduled Meds:   stroke: early stages of recovery book   Does not apply Once   atorvastatin  80 mg Oral QPM   azithromycin  500 mg Oral Daily   Chlorhexidine Gluconate Cloth  6 each Topical Daily   dapagliflozin propanediol  10 mg Oral QAC breakfast   mirtazapine  7.5 mg Oral QHS   pantoprazole  80 mg Oral Daily   tamsulosin  0.4 mg Oral Daily   Continuous Infusions:  cefTRIAXone (ROCEPHIN)  IV Stopped (01/08/22 0705)   heparin 600 Units/hr (01/09/22 0359)   PRN Meds: acetaminophen **OR** acetaminophen, albuterol, magnesium hydroxide, nitroGLYCERIN, ondansetron **OR** ondansetron (ZOFRAN) IV   Vital Signs    Vitals:   01/08/22 2200 01/09/22 0000 01/09/22 0100 01/09/22 0400  BP: 132/69 (!) 144/77 (!) 196/74 (!) 158/81  Pulse:  (!) 40 (!) 52 (!) 44  Resp: 12 (!) 7 16 11   Temp:  (!) 97.3 F (36.3 C)  (!) 97.2 F (36.2 C)  TempSrc:      SpO2:  100% 100% 100%  Weight:      Height:        Intake/Output Summary (Last 24 hours) at 01/09/2022 0745 Last data filed at 01/09/2022 0500 Gross per 24 hour  Intake 516.54 ml  Output 1800 ml  Net -1283.46 ml    Filed Weights   01/06/22 1614 01/07/22 0300  Weight: 65.7 kg 67.5 kg    Telemetry    Townsend with rates in the 60s bpm, 1st degree AV block - Personally Reviewed  ECG    No new tracings - Personally Reviewed  Physical Exam   GEN: No acute distress.   Neck: No JVD. Cardiac: RRR, no murmurs, rubs, or gallops.  Respiratory: Clear to auscultation bilaterally.  GI: Soft, nontender, non-distended.   MS: No edema; No deformity. Neuro:  Alert and oriented x 3;  Nonfocal.  Psych: Normal affect.  Labs    Chemistry Recent Labs  Lab 01/06/22 1319 01/06/22 1621 01/06/22 2015 01/07/22 0321 01/08/22 0430 01/09/22 0123  NA 138   < >  --  141 145 144  K 3.9   < >  --  3.9 3.6 3.6  CL 104   < >  --  113* 115* 116*  CO2 21   < >  --  23 23 24   GLUCOSE 112*   < >  --  97 70 126*  BUN 22   < >  --  25* 21 22  CREATININE 1.48*   < >  --  1.57* 1.83* 1.81*  CALCIUM 8.7   < >  --  8.5* 8.7* 8.7*  PROT 6.7  --  7.1  --   --   --   ALBUMIN 4.0  --  3.4*  --   --   --   AST 37  --  41  --   --   --   ALT 77*  --  77*  --   --   --   ALKPHOS 181*  --  140*  --   --   --   BILITOT <0.2  --  0.4  --   --   --  GFRNONAA  --    < >  --  49* 41* 41*  ANIONGAP  --    < >  --  5 7 4*   < > = values in this interval not displayed.      Hematology Recent Labs  Lab 01/07/22 0321 01/08/22 0430 01/09/22 0123  WBC 3.7* 4.1 4.3  RBC 2.87* 3.18* 2.96*  HGB 7.9* 8.7* 8.3*  HCT 25.3* 28.3* 26.6*  MCV 88.2 89.0 89.9  MCH 27.5 27.4 28.0  MCHC 31.2 30.7 31.2  RDW 17.1* 17.6* 17.8*  PLT 181 206 163     Cardiac EnzymesNo results for input(s): TROPONINI in the last 168 hours. No results for input(s): TROPIPOC in the last 168 hours.   BNPNo results for input(s): BNP, PROBNP in the last 168 hours.   DDimer No results for input(s): DDIMER in the last 168 hours.   Radiology    DG Chest 2 View  Result Date: 01/06/2022 IMPRESSION: Signs of atelectasis but with more confluent area of consolidation at the LEFT lung base that may represent volume loss or developing pneumonia. Electronically Signed   By: Geoffrey  Wile M.D.   On: 01/06/2022 16:45   CT Head Wo Contrast  Result Date: 01/07/2022 IMPRESSION: 1. No acute intracranial abnormality. 2. Generalized cerebral atrophy with chronic white matter small vessel ischemic changes. Electronically Signed   By: Thaddeus  Houston M.D.   On: 01/07/2022 00:08   CT Chest Wo Contrast  Result Date:  01/07/2022 IMPRESSION: 1. Mild right basilar and left lower lobe linear scarring, atelectasis and/or infiltrate. This is mildly increased in severity when compared to the prior study. 2. Mild cardiomegaly with moderate severity coronary artery calcification. Electronically Signed   By: Thaddeus  Houston M.D.   On: 01/07/2022 00:12   Cardiac Studies   Cardiac MRI 01/07/2022: IMPRESSION: 1.  Normal LV size and function, LVEF 51% 2.  Normal RV size and function. 3.  No delayed enhancement or scar noted 4.  Normal native T1 value, elevated ECV. 5. With native T1 value being normal, elevated ECV likely influenced by motion artifact. 6. No findings to suggest infiltrative disease such as sarcoid or amyloid. __________  Zio patch 10/2021: The patient was monitored for 14 days. The predominant rhythm was sinus with an average rate of 56 bpm (range 27-126 bpm). There were rare PACs and PVCs. Episodes of Mobitz type I second-degree AV block and 2:1 AV block were observed. No prolonged pauses (greater than 3 seconds) was observed. Patient triggered events corresponded to sinus rhythm and PVCs.   Predominantly sinus rhythm with episodes of Mobitz type I second-degree AV block and 2:1 AV block. __________  2D echo 10/17/2021: 1. Left ventricular ejection fraction, by estimation, is 50 to 55%. The  left ventricle has low normal function. The left ventricle has no regional  wall motion abnormalities. Left ventricular diastolic parameters are  consistent with Grade I diastolic  dysfunction (impaired relaxation).   2. Right ventricular systolic function is normal. The right ventricular  size is normal. There is normal pulmonary artery systolic pressure. The  estimated right ventricular systolic pressure is 26.0 mmHg.   3. The mitral valve is normal in structure. Mild mitral valve  regurgitation. No evidence of mitral stenosis.   4. The aortic valve is tricuspid. Aortic valve regurgitation is not   visualized. No aortic stenosis is present.   5. The inferior vena cava is normal in size with greater than 50%  respiratory variability, suggesting right   atrial pressure of 3 mmHg. __________  Elwyn Reach patch 10/2019: The patient was monitored for 5 days, 15 hours. The predominant rhythm was sinus with an average rate of 62 bpm (range 38-138 bpm). There were rare PAC's and PVC's. No sustained arrhythmia or prolonged pause was identified. There were no patient triggered events.   Predominantly sinus rhythm without significant arrhythmia.  Of note, preceding 14 day monitor showed paroxysmal atrial fibrillation. __________  Elwyn Reach patch 09/2019: The patient was monitored for 14 days. The predominant rhythm was sinus with an average rate of 62 bpm (range 38 to 124 bpm in sinus). Rare PACs and PVCs were noted. Paroxysmal atrial fibrillation occurred, with the longest episode lasting 1 hours, 7 minutes. Average ventricular rate while in atrial fibrillation was 90 bpm with a range of 51 to 188 bpm. Atrial fibrillation burden was less than 1%. There was no prolonged pause. Patient triggered event corresponds to sinus rhythm.   Predominantly sinus rhythm with paroxysmal atrial fibrillation (longest episode lasting 1 hour, 7 minutes; <1% burden).  Rare PACs and PVCs also noted. __________  Carlton Adam MPI 09/06/2019: This is a low risk study. The calculated left ventricular ejection fraction is moderately decreased (33%). This is likely a gating artifact as Visual EF appears normal at about 55%. recommend correlation with another imaging modality such as echocardiogram. There was no evidence for ischemia Mild diaphragmatic attenuation noted ___________  2D echo 09/05/2019: 1. Left ventricular ejection fraction, by visual estimation, is 55 to  60%. The left ventricle has normal function. There is borderline left  ventricular hypertrophy.   2. Left ventricular diastolic parameters are consistent with  Grade I  diastolic dysfunction (impaired relaxation).   3. The left ventricle has no regional wall motion abnormalities.   4. Global right ventricle has low normal systolic function.The right  ventricular size is normal. No increase in right ventricular wall  thickness.   5. Left atrial size was normal.   6. Right atrial size was normal.   7. The mitral valve is degenerative. Mild mitral valve regurgitation. No  evidence of mitral stenosis.   8. The tricuspid valve is grossly normal.   9. The tricuspid valve is grossly normal. Tricuspid valve regurgitation  is not demonstrated.  10. The aortic valve is tricuspid. Aortic valve regurgitation is not  visualized. No significant stenosis suspected, though evaluation is  limited by lack of spectral Doppler images.  11. The pulmonic valve was not well visualized. Pulmonic valve  regurgitation is not visualized.  12. Mildly elevated pulmonary artery systolic pressure.  13. The tricuspid regurgitant velocity is 2.94 m/s, and with an assumed  right atrial pressure of 3 mmHg, the estimated right ventricular systolic  pressure is mildly elevated at 37.6 mmHg.  14. The inferior vena cava is normal in size with greater than 50%  respiratory variability, suggesting right atrial pressure of 3 mmHg.  15. The interatrial septum was not well visualized. ___________  2D echo 09/02/2018: - Left ventricle: The cavity size was normal. There was mild    concentric hypertrophy. Systolic function was normal. The    estimated ejection fraction was 65%. Wall motion was normal;    there were no regional wall motion abnormalities. Doppler    parameters are consistent with abnormal left ventricular    relaxation (grade 1 diastolic dysfunction).  - Mitral valve: There was mild regurgitation.   Impressions:   - No cardiac source of emboli was indentified.  __________  Carotid artery ultrasound 09/01/2018: IMPRESSION: 1.  Minimal to moderate amount of  right-sided atherosclerotic plaque, not resulting in a hemodynamically significant stenosis. 2. Normal sonographic evaluation of the left carotid system. __________  2D echo 11/10/2016: - Left ventricle: The cavity size was normal. Wall thickness was    increased in a pattern of mild LVH. Systolic function was normal.    The estimated ejection fraction was in the range of 55% to 65%.  Patient Profile     66 y.o. 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   Assessment & Plan    1. Symptomatic bradycardia/history of Mobtiz type I 2nd degree AV block/coronary artery calcification with unstable angina: -Chest pain free  -Troponin negative -Maintaining sinus rhythm with heart rates in the 60s bpm -Avoid AV nodal blocking medications -TSH normal, magnesium at goal, potassium normal -Cardiac MRI without evidence of infiltrative cardiomyopathy  -NPO  -LHC later today -Monitor on tele -Follow up with EP recommendations after LHC  2. PAF: -Maintaining sinus rhythm -No longer on beta blocker given prior Mobitzt type I 2nd degree AV block -CHADS2VASc 5 (HTN, DM, CVA x 2, vascular disease) -Continue to hold Eliquis -Heparin gtt -Resume PTA Eliquis once it is clear no further inpatient procedure will be needed  3. Recurrent CVA with left-sided hemiparesis: -No new deficits  -On Eliquis as outpatient, heparin gtt while inpatient  4. CKD stage III: -Relatively stable -Pre-cath IV fluids  5. Anemia of chronic disease: -Stable -Monitor  6. HLD: -LDL 35 -PTA Lipitor   Shared Decision Making/Informed Consent{  The risks [stroke (1 in 1000), death (1 in 1000), kidney failure [usually temporary] (1 in 500), bleeding (1  in 200), allergic reaction [possibly serious] (1 in 200)], benefits (diagnostic support and management of coronary artery disease) and alternatives of a cardiac catheterization were discussed in detail with Mr. Czech and he is willing to proceed.   For questions or updates, please contact [place] Please consult [email or web address] for contact info under Cardiology/STEMI.    Signed, Christell Faith, PA-C [place] Pager: [phone] 01/09/2022, 7:45 AM

## 2022-01-09 NOTE — Plan of Care (Signed)
Continuing with plan of care. 

## 2022-01-09 NOTE — Progress Notes (Signed)
OT Cancellation Note  Patient Details Name: Evan GADISON Sr. MRN: [id number] DOB: [birth year]   Cancelled Treatment:    Reason Eval/Treat Not Completed: Medical issues which prohibited therapy;Other (comment) (per RN hold on this date due to low temp (91.4). OT will continue to follow acutely.) Shanon Payor, OTD OTR/L  01/09/22, 4:17 PM

## 2022-01-09 NOTE — Consult Note (Signed)
[place] for IV Heparin Indication: atrial fibrillation (apixaban previously, last dose 5/24 at 2100)  Patient Measurements: Height: 5\' 6"  (167.6 cm) Weight: 67.5 kg (148 lb 13 oz) IBW/kg (Calculated) : 63.8 Heparin Dosing Weight: 67.5 kg  Labs: Recent Labs    01/06/22 1621 01/06/22 2015 01/07/22 0033 01/07/22 0320 01/07/22 0321 01/08/22 0430 01/08/22 1605 01/09/22 0123  HGB 8.6*  --   --   --  7.9* 8.7*  --  8.3*  HCT 27.6*  --   --   --  25.3* 28.3*  --  26.6*  PLT 212  --   --   --  181 206  --  163  APTT  --   --  42*  --   --   --  >200* 189*  LABPROT  --   --  14.6  --   --   --   --   --   INR  --   --  1.2  --   --   --   --   --   HEPARINUNFRC  --   --   --   --   --   --   --  >1.10*  CREATININE 1.65*  --   --   --  1.57* 1.83*  --  1.81*  TROPONINIHS 7 7  --  8  --   --   --   --      Estimated Creatinine Clearance: 37.2 mL/min (A) (by C-G formula based on SCr of 1.81 mg/dL (H)).   Medical History: Past Medical History:  Diagnosis Date   Carotid arterial disease ([place])    a. 08/2018 Carotid U/S: min-mod RICA atherosclerosis w/o hemodynamically significant stenosis. Nl LICA.   Diabetes 1.5, managed as type 2 ([place])    Diastolic dysfunction    a. 08/2018 Echo: EF 65%. No rwma. Gr1 DD. Mild MR.   Diastolic dysfunction    a. 08/2019 Echo: EF 55-60%, Gr1 DD. No rwma. Mild MR. RVSP 37.55mmHg.   Hypercholesterolemia    Hypertension    PAF (paroxysmal atrial fibrillation) ([place])    a. 10/2019 Event Monitor: PAF; b. CHA2DS2VASc = 5-->Eliquis.   Poorly controlled diabetes mellitus ([place])    a. 04/2019 A1c 13.8.   Recurrent strokes ([place])    a. 10/2016 MRI/A: Acute 55mm R thalamic infarct, ? subacute infarct of R corona radiata; b. 08/2017 MRI/A: Acute 35mm lateral L thalamic infarct. Other more remote lacunar infarcts of thalami bilat. Small vessel dzs; c. 08/2018 MRI/A: Acute lacunar infarct of the post limb of R internal capsule; d.  08/2019 MRI Acute CVA of L paramedian pons adn R cerebellar hemisphere.   Tobacco abuse     Medications:  Apixaban 5 mg BID (last dose 5/24 at 2100)  Assessment: Patient is a 66 y/o M with medical history as above and including Afib and recurrent strokes on apixaban who is admitted with symptomatic bradycardia in the setting of pneumonia. Cardiology following and plan is for cardiac catheterization. Apixaban has been placed on hold. Pharmacy consulted for management of heparin infusion for Afib.   CBC is stable. aPTT checked yesterday elevated to 42s. INR 1.2.   5/25 1605 aPTT > 200 sec, Supratherapeutic   Goal of Therapy:  aPTT 66 - 102 seconds Monitor platelets by anticoagulation protocol: Yes   Plan:  5/26: aPTT @ 0123 = 189,  HL = > 1.10 Will hold heparin drip for 1 hr and restart @  600 units/hr. Will recheck aPTT 6 hrs after rate change.   Orene Desanctis, PharmD Clinical Pharmacist   01/09/2022,2:44 AM

## 2022-01-09 NOTE — Consult Note (Signed)
ANTICOAGULATION CONSULT NOTE  Pharmacy Consult for IV Heparin Indication: atrial fibrillation (apixaban previously, last dose 5/24 at 2100)  Patient Measurements: Height: 5\' 6"  (167.6 cm) Weight: 67.5 kg (148 lb 13 oz) IBW/kg (Calculated) : 63.8 Heparin Dosing Weight: 67.5 kg  Labs: Recent Labs     0000 01/06/22 2015 01/07/22 0033 01/07/22 0320 01/07/22 0321 01/07/22 0321 01/08/22 0430 01/08/22 1605 01/09/22 0123 01/09/22 0954  HGB  --   --   --   --  7.9*   < > 8.7*  --  8.3*  --   HCT  --   --   --   --  25.3*  --  28.3*  --  26.6*  --   PLT  --   --   --   --  181  --  206  --  163  --   APTT   < >  --  42*  --   --   --   --  >200* 189* 106*  LABPROT  --   --  14.6  --   --   --   --   --   --   --   INR  --   --  1.2  --   --   --   --   --   --   --   HEPARINUNFRC  --   --   --   --   --   --   --   --  >1.10* >1.10*  CREATININE  --   --   --   --  1.57*  --  1.83*  --  1.81*  --   TROPONINIHS  --  7  --  8  --   --   --   --   --   --    < > = values in this interval not displayed.     Estimated Creatinine Clearance: 37.2 mL/min (A) (by C-G formula based on SCr of 1.81 mg/dL (H)).   Medical History: Past Medical History:  Diagnosis Date   Carotid arterial disease (HCC)    a. 08/2018 Carotid U/S: min-mod RICA atherosclerosis w/o hemodynamically significant stenosis. Nl LICA.   Diabetes 1.5, managed as type 2 (HCC)    Diastolic dysfunction    a. 08/2018 Echo: EF 65%. No rwma. Gr1 DD. Mild MR.   Diastolic dysfunction    a. 08/2019 Echo: EF 55-60%, Gr1 DD. No rwma. Mild MR. RVSP 37.50mmHg.   Hypercholesterolemia    Hypertension    PAF (paroxysmal atrial fibrillation) (HCC)    a. 10/2019 Event Monitor: PAF; b. CHA2DS2VASc = 5-->Eliquis.   Poorly controlled diabetes mellitus (HCC)    a. 04/2019 A1c 13.8.   Recurrent strokes (HCC)    a. 10/2016 MRI/A: Acute 73mm R thalamic infarct, ? subacute infarct of R corona radiata; b. 08/2017 MRI/A: Acute 3mm lateral L thalamic  infarct. Other more remote lacunar infarcts of thalami bilat. Small vessel dzs; c. 08/2018 MRI/A: Acute lacunar infarct of the post limb of R internal capsule; d. 08/2019 MRI Acute CVA of L paramedian pons adn R cerebellar hemisphere.   Tobacco abuse     Medications:  Apixaban 5 mg BID (last dose 5/24 at 2100)  Assessment: Patient is a 65 y/o M with medical history as above and including Afib and recurrent strokes on apixaban who is admitted with symptomatic bradycardia in the setting of pneumonia. Cardiology following and plan is for cardiac catheterization. Apixaban has  been placed on hold. Pharmacy consulted for management of heparin infusion for Afib. Now s/p cardiac catheterization/left heart catheterization with Dr. Okey Dupre today. Pharmacy received instructions to restart heparin 2 hours after radial band removal, which occurred at 1630 per RN  CBC is stable. Baseline aPTT elevated to 42s. INR 1.2.   Goal of Therapy:  aPTT 66 - 102 seconds Monitor platelets by anticoagulation protocol: Yes   Plan:  --at 1830 restart heparin infusion at 550 units/hr --check aPTT 6 hours after restarting --Daily CBC per protocol while on IV heparin  Lowella Bandy 01/09/2022,5:05 PM

## 2022-01-09 NOTE — Progress Notes (Signed)
Bear hugger applied, unable to retrieve an axillary or oral temperature. Rectal temp 35.1C

## 2022-01-09 NOTE — Consult Note (Signed)
ANTICOAGULATION CONSULT NOTE  Pharmacy Consult for IV Heparin Indication: atrial fibrillation (apixaban previously, last dose 5/24 at 2100)  Patient Measurements: Height: 5\' 6"  (167.6 cm) Weight: 67.5 kg (148 lb 13 oz) IBW/kg (Calculated) : 63.8 Heparin Dosing Weight: 67.5 kg  Labs: Recent Labs    01/06/22 1621 01/06/22 1621 01/06/22 2015 01/07/22 0033 01/07/22 0320 01/07/22 0321 01/08/22 0430 01/08/22 1605 01/09/22 0123 01/09/22 0954  HGB 8.6*  --   --   --   --  7.9* 8.7*  --  8.3*  --   HCT 27.6*  --   --   --   --  25.3* 28.3*  --  26.6*  --   PLT 212  --   --   --   --  181 206  --  163  --   APTT  --    < >  --  42*  --   --   --  >200* 189* 106*  LABPROT  --   --   --  14.6  --   --   --   --   --   --   INR  --   --   --  1.2  --   --   --   --   --   --   HEPARINUNFRC  --   --   --   --   --   --   --   --  >1.10* >1.10*  CREATININE 1.65*  --   --   --   --  1.57* 1.83*  --  1.81*  --   TROPONINIHS 7  --  7  --  8  --   --   --   --   --    < > = values in this interval not displayed.     Estimated Creatinine Clearance: 37.2 mL/min (A) (by C-G formula based on SCr of 1.81 mg/dL (H)).   Medical History: Past Medical History:  Diagnosis Date   Carotid arterial disease (HCC)    a. 08/2018 Carotid U/S: min-mod RICA atherosclerosis w/o hemodynamically significant stenosis. Nl LICA.   Diabetes 1.5, managed as type 2 (HCC)    Diastolic dysfunction    a. 08/2018 Echo: EF 65%. No rwma. Gr1 DD. Mild MR.   Diastolic dysfunction    a. 08/2019 Echo: EF 55-60%, Gr1 DD. No rwma. Mild MR. RVSP 37.57mmHg.   Hypercholesterolemia    Hypertension    PAF (paroxysmal atrial fibrillation) (HCC)    a. 10/2019 Event Monitor: PAF; b. CHA2DS2VASc = 5-->Eliquis.   Poorly controlled diabetes mellitus (HCC)    a. 04/2019 A1c 13.8.   Recurrent strokes (HCC)    a. 10/2016 MRI/A: Acute 34mm R thalamic infarct, ? subacute infarct of R corona radiata; b. 08/2017 MRI/A: Acute 76mm lateral L  thalamic infarct. Other more remote lacunar infarcts of thalami bilat. Small vessel dzs; c. 08/2018 MRI/A: Acute lacunar infarct of the post limb of R internal capsule; d. 08/2019 MRI Acute CVA of L paramedian pons adn R cerebellar hemisphere.   Tobacco abuse     Medications:  Apixaban 5 mg BID (last dose 5/24 at 2100)  Assessment: Patient is a 66 y/o M with medical history as above and including Afib and recurrent strokes on apixaban who is admitted with symptomatic bradycardia in the setting of pneumonia. Cardiology following and plan is for cardiac catheterization. Apixaban has been placed on hold. Pharmacy consulted for management of heparin infusion  for Afib.   CBC is stable. Baseline aPTT elevated to 42s. INR 1.2.   Goal of Therapy:  aPTT 66 - 102 seconds Monitor platelets by anticoagulation protocol: Yes   Plan:  --aPTT slightly elevated --Decrease heparin infusion to 550 units/hr --Re-check aPTT 6 hours after rate change --Daily CBC per protocol while on IV heparin  Tressie Ellis 01/09/2022,12:59 PM

## 2022-01-09 NOTE — Interval H&P Note (Signed)
History and Physical Interval Note:  01/09/2022 11:46 AM  Evan Ano Sr.  has presented today for surgery, with the diagnosis of unstable angina with symptomatic bradycardia.  The various methods of treatment have been discussed with the patient and family. After consideration of risks, benefits and other options for treatment, the patient has consented to  Procedure(s): LEFT HEART CATH AND CORONARY ANGIOGRAPHY (N/A) as a surgical intervention.  The patient's history has been reviewed, patient examined, no change in status, stable for surgery.  I have reviewed the patient's chart and labs.  Questions were answered to the patient's satisfaction.    Cath Lab Visit (complete for each Cath Lab visit)  Clinical Evaluation Leading to the Procedure:   ACS: Yes.    Non-ACS:  N/A  Zofia Peckinpaugh

## 2022-01-09 NOTE — Progress Notes (Signed)
  Progress Note   Patient: Evan Townsend MCN:[id number] DOB: March 10, 1956 DOA: 01/06/2022     2 DOS: the patient was seen and examined on 01/09/2022   Brief hospital course: No notes on file  Assessment and Plan: * Symptomatic bradycardia --BP stable.  Mobitz type I second-degree AV block, 2-1 AV block on Zio monitor. --Seen by cardiology and EP.  Cardiac MRI with no infiltrative cardiomyopathy. Plan: --cardiac catheterization/left heart catheterization today -defer decision on pacemaker to EP pending results of catheterization  Sepsis due to pneumonia (HCC) - mild leukopenia and hypothermia, however, no cough or hypoxia.  CT chest showed Mild right basilar and left lower lobe linear scarring, atelectasis and/or infiltrate. Plan: --cont ceftriaxone and azithro for now  Hypertension --BP varied widely --resume home amlodipine --IV hydralazine PRN  Acute on chronic anemia --anemia workup showed iron def --start oral iron supplement  Dyslipidemia - continue statin therapy.  Paroxysmal atrial fibrillation (HCC) --currently in NSR --on heparin gtt in place of Eliquis for upcoming procedure  GERD without esophagitis - continue PPI therapy  BPH (benign prostatic hyperplasia) - continue his Flomax.  History of stroke --with left-sided hemiparesis and dysarthria  --cont ASA and statin         Subjective:  Pt denied pain this morning and said he felt much better.  Going for left heart cath today.   Physical Exam:  Constitutional: NAD, more alert, oriented to person and place, slurred speech HEENT: conjunctivae and lids normal, EOMI CV: No cyanosis.   RESP: normal respiratory effort, on RA SKIN: warm, dry   Data Reviewed:  Family Communication:   Disposition: Status is: Inpatient   Planned Discharge Destination: Rehab    Time spent: 35 minutes  Author: Darlin Priestly, MD 01/09/2022 11:54 AM  For on call review [email or web address].

## 2022-01-09 NOTE — Progress Notes (Signed)
Physical Therapy Treatment Patient Details Name: Evan WILLNER Sr. MRN: [id number] DOB: 09-01-1955 Today's Date: 01/09/2022   History of Present Illness Pt is a 66 year old admitted with symptomatic bradycardia, sepsis due to penumonia following acute onset of mid sternal chest pain felt as pressure and graded 10/10 in severity with no nausea or vomiting or diaphoresis. 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 Pt with improving ability to complete bed mobility as he only required supervision but still reliant on hospital bed features. Pt completes STS with min/mod assist & maintains LUE flexed towards trunk throughout entire session. Pt has personal lofstrand crutch present but unable to use it during session 2/2 IV placement in RUE. Pt instead holds to IV pole with RUE & PT supports on L side. Pt is able to ambulate twice during session with mod assist with gait pattern as noted below. Will continue to follow pt acutely to address balance, endurance, and gait with LRAD.    Recommendations for follow up therapy are one component of a multi-disciplinary discharge planning process, led by the attending physician.  Recommendations may be updated based on patient status, additional functional criteria and insurance authorization.  Follow Up Recommendations  Skilled nursing-short term rehab (<3 hours/day)     Assistance Recommended at Discharge Frequent or constant Supervision/Assistance  Patient can return home with the following A lot of help with walking and/or transfers;A lot of help with bathing/dressing/bathroom;Assistance with cooking/housework;Assist for transportation;Help with stairs or ramp for entrance    Equipment Recommendations  None recommended by PT    Recommendations for Other Services       Precautions / Restrictions Precautions Precautions: Fall Precaution Comments: old L hemi Restrictions Weight Bearing Restrictions: No     Mobility  Bed Mobility Overal bed mobility: Needs Assistance Bed Mobility: Supine to Sit     Supine to sit: Supervision, HOB elevated     General bed mobility comments: extra time, use of bed rails to come to sitting EOB    Transfers Overall transfer level: Needs assistance Equipment used: 1 person hand held assist (LUE HHA) Transfers: Sit to/from Stand Sit to Stand: Mod assist           General transfer comment: pt able to push to standing with RUE    Ambulation/Gait Ambulation/Gait assistance: Mod assist Gait Distance (Feet):  (20 + 35 ft) Assistive device: 1 person hand held assist, IV Pole (LUE HHA) Gait Pattern/deviations: Step-through pattern, Decreased step length - right, Decreased step length - left, Decreased dorsiflexion - right, Decreased dorsiflexion - left, Decreased stride length, Decreased weight shift to left, Decreased stance time - left Gait velocity: decreased     General Gait Details: Pt with excessive L hip flexion during swing phase to compensate for impaired LLE dorsiflexion, decreased weight shift to L, cuing for sequencing/stepping when turning, pt elects to push IV pole with RUE (unable to use his personal lofstrand crutch 2/2 IV Placement in RUE), 1 LOB & max assist to correct; pt maintains L elbow flexed & up towards trunk. Pt required seated rest break between each gait bout with pt reporting his BLE gets tired.   Stairs             Psychologist, prison and probation services  Modified Rankin (Stroke Patients Only)       Balance Overall balance assessment: Needs assistance Sitting-balance support: Feet supported Sitting balance-Leahy Scale: Fair Sitting balance - Comments: supervision static sitting    Standing balance support: During functional activity, Single extremity supported Standing balance-Leahy Scale: Poor Standing balance comment: min assist static standing                            Cognition Arousal/Alertness: Awake/alert Behavior During Therapy: Flat affect Overall Cognitive Status: Impaired/Different from baseline Area of Impairment: Orientation, Following commands, Awareness, Problem solving                       Following Commands: Follows one step commands with increased time Safety/Judgement: Decreased awareness of deficits Awareness: Emergent Problem Solving: Slow processing, Decreased initiation, Difficulty sequencing, Requires verbal cues, Requires tactile cues General Comments: patient is able to follow all commands with increased time        Exercises      General Comments General comments (skin integrity, edema, etc.): PT observed bowel incontinence when pt transferred STS to PT provided dependent assistance for peri hygiene.      Pertinent Vitals/Pain Pain Assessment Pain Assessment: No/denies pain    Home Living Family/patient expects to be discharged to:: Private residence                        Prior Function            PT Goals (current goals can now be found in the care plan section) Acute Rehab PT Goals Patient Stated Goal: to get stronger. PT Goal Formulation: With patient Time For Goal Achievement: 01/21/22 Potential to Achieve Goals: Good Progress towards PT goals: Progressing toward goals    Frequency    Min 2X/week      PT Plan Current plan remains appropriate    Co-evaluation              AM-PAC PT "6 Clicks" Mobility   Outcome Measure  Help needed turning from your back to your side while in a flat bed without using bedrails?: A Little Help needed moving from lying on your back to sitting on the side of a flat bed without using bedrails?: A Little Help needed moving to and  from a bed to a chair (including a wheelchair)?: A Lot Help needed standing up from a chair using your arms (e.g., wheelchair or bedside chair)?: A Lot Help needed to walk in hospital room?: A Lot Help needed climbing 3-5 steps with a railing? : Total 6 Click Score: 13    End of Session Equipment Utilized During Treatment: Gait belt Activity Tolerance: Patient tolerated treatment well Patient left: in chair;with chair alarm set;with nursing/sitter in room;with call bell/phone within reach Nurse Communication: Mobility status PT Visit Diagnosis: Unsteadiness on feet (R26.81);Muscle weakness (generalized) (M62.81);Difficulty in walking, not elsewhere classified (R26.2);Other abnormalities of gait and mobility (R26.89)     Time: 6553-7482 PT Time Calculation (min) (ACUTE ONLY): 23 min  Charges:  $Therapeutic Activity: 23-37 mins                     Aleda Grana, PT, DPT 01/09/22, 12:29 PM   Sandi Mariscal 01/09/2022, 12:27 PM

## 2022-01-09 NOTE — H&P (View-Only) (Signed)
Progress Note  Patient Name: Evan PACKETT Sr. Date of Encounter: 01/09/2022  Primary Cardiologist: End Primary Electrophysiologist: Quentin Ore  Subjective   More alert this morning when compared to yesterday. No chest pain, dyspnea, palpitations, dizziness, presyncope, or syncope. He is for [facility] later today. EP following.   Inpatient Medications    Scheduled Meds:   stroke: early stages of recovery book   Does not apply Once   atorvastatin  80 mg Oral QPM   azithromycin  500 mg Oral Daily   Chlorhexidine Gluconate Cloth  6 each Topical Daily   dapagliflozin propanediol  10 mg Oral QAC breakfast   mirtazapine  7.5 mg Oral QHS   pantoprazole  80 mg Oral Daily   tamsulosin  0.4 mg Oral Daily   Continuous Infusions:  cefTRIAXone (ROCEPHIN)  IV Stopped (01/08/22 0705)   heparin 600 Units/hr (01/09/22 0359)   PRN Meds: acetaminophen **OR** acetaminophen, albuterol, magnesium hydroxide, nitroGLYCERIN, ondansetron **OR** ondansetron (ZOFRAN) IV   Vital Signs    Vitals:   01/08/22 2200 01/09/22 0000 01/09/22 0100 01/09/22 0400  BP: 132/69 (!) 144/77 (!) 196/74 (!) 158/81  Pulse:  (!) 40 (!) 52 (!) 44  Resp: 12 (!) 7 16 11   Temp:  (!) 97.3 F (36.3 C)  (!) 97.2 F (36.2 C)  TempSrc:      SpO2:  100% 100% 100%  Weight:      Height:        Intake/Output Summary (Last 24 hours) at 01/09/2022 0745 Last data filed at 01/09/2022 0500 Gross per 24 hour  Intake 516.54 ml  Output 1800 ml  Net -1283.46 ml    Filed Weights   01/06/22 1614 01/07/22 0300  Weight: 65.7 kg 67.5 kg    Telemetry    SR with rates in the 60s bpm, 1st degree AV block - Personally Reviewed  ECG    No new tracings - Personally Reviewed  Physical Exam   GEN: No acute distress.   Neck: No JVD. Cardiac: RRR, no murmurs, rubs, or gallops.  Respiratory: Clear to auscultation bilaterally.  GI: Soft, nontender, non-distended.   MS: No edema; No deformity. Neuro:  Alert and oriented x 3;  Nonfocal.  Psych: Normal affect.  Labs    Chemistry Recent Labs  Lab 01/06/22 1319 01/06/22 1621 01/06/22 2015 01/07/22 0321 01/08/22 0430 01/09/22 0123  NA 138   < >  --  141 145 144  K 3.9   < >  --  3.9 3.6 3.6  CL 104   < >  --  113* 115* 116*  CO2 21   < >  --  23 23 24   GLUCOSE 112*   < >  --  97 70 126*  BUN 22   < >  --  25* 21 22  CREATININE 1.48*   < >  --  1.57* 1.83* 1.81*  CALCIUM 8.7   < >  --  8.5* 8.7* 8.7*  PROT 6.7  --  7.1  --   --   --   ALBUMIN 4.0  --  3.4*  --   --   --   AST 37  --  41  --   --   --   ALT 77*  --  77*  --   --   --   ALKPHOS 181*  --  140*  --   --   --   BILITOT <0.2  --  0.4  --   --   --  GFRNONAA  --    < >  --  49* 41* 41*  ANIONGAP  --    < >  --  5 7 4*   < > = values in this interval not displayed.      Hematology Recent Labs  Lab 01/07/22 0321 01/08/22 0430 01/09/22 0123  WBC 3.7* 4.1 4.3  RBC 2.87* 3.18* 2.96*  HGB 7.9* 8.7* 8.3*  HCT 25.3* 28.3* 26.6*  MCV 88.2 89.0 89.9  MCH 27.5 27.4 28.0  MCHC 31.2 30.7 31.2  RDW 17.1* 17.6* 17.8*  PLT 181 206 163     Cardiac EnzymesNo results for input(s): TROPONINI in the last 168 hours. No results for input(s): TROPIPOC in the last 168 hours.   BNPNo results for input(s): BNP, PROBNP in the last 168 hours.   DDimer No results for input(s): DDIMER in the last 168 hours.   Radiology    DG Chest 2 View  Result Date: 01/06/2022 IMPRESSION: Signs of atelectasis but with more confluent area of consolidation at the LEFT lung base that may represent volume loss or developing pneumonia. Electronically Signed   By: Zetta Bills M.D.   On: 01/06/2022 16:45   CT Head Wo Contrast  Result Date: 01/07/2022 IMPRESSION: 1. No acute intracranial abnormality. 2. Generalized cerebral atrophy with chronic white matter small vessel ischemic changes. Electronically Signed   By: Virgina Norfolk M.D.   On: 01/07/2022 00:08   CT Chest Wo Contrast  Result Date:  01/07/2022 IMPRESSION: 1. Mild right basilar and left lower lobe linear scarring, atelectasis and/or infiltrate. This is mildly increased in severity when compared to the prior study. 2. Mild cardiomegaly with moderate severity coronary artery calcification. Electronically Signed   By: Virgina Norfolk M.D.   On: 01/07/2022 00:12   Cardiac Studies   Cardiac MRI 01/07/2022: IMPRESSION: 1.  Normal LV size and function, LVEF 51% 2.  Normal RV size and function. 3.  No delayed enhancement or scar noted 4.  Normal native T1 value, elevated ECV. 5. With native T1 value being normal, elevated ECV likely influenced by motion artifact. 6. No findings to suggest infiltrative disease such as sarcoid or amyloid. __________  Elwyn Reach patch 10/2021: The patient was monitored for 14 days. The predominant rhythm was sinus with an average rate of 56 bpm (range 27-126 bpm). There were rare PACs and PVCs. Episodes of Mobitz type I second-degree AV block and 2:1 AV block were observed. No prolonged pauses (greater than 3 seconds) was observed. Patient triggered events corresponded to sinus rhythm and PVCs.   Predominantly sinus rhythm with episodes of Mobitz type I second-degree AV block and 2:1 AV block. __________  2D echo 10/17/2021: 1. Left ventricular ejection fraction, by estimation, is 50 to 55%. The  left ventricle has low normal function. The left ventricle has no regional  wall motion abnormalities. Left ventricular diastolic parameters are  consistent with Grade I diastolic  dysfunction (impaired relaxation).   2. Right ventricular systolic function is normal. The right ventricular  size is normal. There is normal pulmonary artery systolic pressure. The  estimated right ventricular systolic pressure is [id number] mmHg.   3. The mitral valve is normal in structure. Mild mitral valve  regurgitation. No evidence of mitral stenosis.   4. The aortic valve is tricuspid. Aortic valve regurgitation is not   visualized. No aortic stenosis is present.   5. The inferior vena cava is normal in size with greater than 50%  respiratory variability, suggesting right  atrial pressure of 3 mmHg. __________  Elwyn Reach patch 10/2019: The patient was monitored for 5 days, 15 hours. The predominant rhythm was sinus with an average rate of 62 bpm (range 38-138 bpm). There were rare PAC's and PVC's. No sustained arrhythmia or prolonged pause was identified. There were no patient triggered events.   Predominantly sinus rhythm without significant arrhythmia.  Of note, preceding 14 day monitor showed paroxysmal atrial fibrillation. __________  Elwyn Reach patch 09/2019: The patient was monitored for 14 days. The predominant rhythm was sinus with an average rate of 62 bpm (range 38 to 124 bpm in sinus). Rare PACs and PVCs were noted. Paroxysmal atrial fibrillation occurred, with the longest episode lasting 1 hours, 7 minutes. Average ventricular rate while in atrial fibrillation was 90 bpm with a range of 51 to 188 bpm. Atrial fibrillation burden was less than 1%. There was no prolonged pause. Patient triggered event corresponds to sinus rhythm.   Predominantly sinus rhythm with paroxysmal atrial fibrillation (longest episode lasting 1 hour, 7 minutes; <1% burden).  Rare PACs and PVCs also noted. __________  Carlton Adam MPI 09/06/2019: This is a low risk study. The calculated left ventricular ejection fraction is moderately decreased (33%). This is likely a gating artifact as Visual EF appears normal at about 55%. recommend correlation with another imaging modality such as echocardiogram. There was no evidence for ischemia Mild diaphragmatic attenuation noted ___________  2D echo 09/05/2019: 1. Left ventricular ejection fraction, by visual estimation, is 55 to  60%. The left ventricle has normal function. There is borderline left  ventricular hypertrophy.   2. Left ventricular diastolic parameters are consistent with  Grade I  diastolic dysfunction (impaired relaxation).   3. The left ventricle has no regional wall motion abnormalities.   4. Global right ventricle has low normal systolic function.The right  ventricular size is normal. No increase in right ventricular wall  thickness.   5. Left atrial size was normal.   6. Right atrial size was normal.   7. The mitral valve is degenerative. Mild mitral valve regurgitation. No  evidence of mitral stenosis.   8. The tricuspid valve is grossly normal.   9. The tricuspid valve is grossly normal. Tricuspid valve regurgitation  is not demonstrated.  10. The aortic valve is tricuspid. Aortic valve regurgitation is not  visualized. No significant stenosis suspected, though evaluation is  limited by lack of spectral Doppler images.  11. The pulmonic valve was not well visualized. Pulmonic valve  regurgitation is not visualized.  12. Mildly elevated pulmonary artery systolic pressure.  13. The tricuspid regurgitant velocity is 2.94 m/s, and with an assumed  right atrial pressure of 3 mmHg, the estimated right ventricular systolic  pressure is mildly elevated at 37.6 mmHg.  14. The inferior vena cava is normal in size with greater than 50%  respiratory variability, suggesting right atrial pressure of 3 mmHg.  15. The interatrial septum was not well visualized. ___________  2D echo 09/02/2018: - Left ventricle: The cavity size was normal. There was mild    concentric hypertrophy. Systolic function was normal. The    estimated ejection fraction was 65%. Wall motion was normal;    there were no regional wall motion abnormalities. Doppler    parameters are consistent with abnormal left ventricular    relaxation (grade 1 diastolic dysfunction).  - Mitral valve: There was mild regurgitation.   Impressions:   - No cardiac source of emboli was indentified.  __________  Carotid artery ultrasound 09/01/2018: IMPRESSION: 1.  Minimal to moderate amount of  right-sided atherosclerotic plaque, not resulting in a hemodynamically significant stenosis. 2. Normal sonographic evaluation of the left carotid system. __________  2D echo 11/10/2016: - Left ventricle: The cavity size was normal. Wall thickness was    increased in a pattern of mild LVH. Systolic function was normal.    The estimated ejection fraction was in the range of 55% to 65%.  Patient Profile     66 y.o. 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   Assessment & Plan    1. Symptomatic bradycardia/history of Mobtiz type I 2nd degree AV block/coronary artery calcification with unstable angina: -Chest pain free  -Troponin negative -Maintaining sinus rhythm with heart rates in the 60s bpm -Avoid AV nodal blocking medications -TSH normal, magnesium at goal, potassium normal -Cardiac MRI without evidence of infiltrative cardiomyopathy  -NPO  -LHC later today -Monitor on tele -Follow up with EP recommendations after LHC  2. PAF: -Maintaining sinus rhythm -No longer on beta blocker given prior Mobitzt type I 2nd degree AV block -CHADS2VASc 5 (HTN, DM, CVA x 2, vascular disease) -Continue to hold Eliquis -Heparin gtt -Resume PTA Eliquis once it is clear no further inpatient procedure will be needed  3. Recurrent CVA with left-sided hemiparesis: -No new deficits  -On Eliquis as outpatient, heparin gtt while inpatient  4. CKD stage III: -Relatively stable -Pre-cath IV fluids  5. Anemia of chronic disease: -Stable -Monitor  6. HLD: -LDL 35 -PTA Lipitor   Shared Decision Making/Informed Consent{  The risks [stroke (1 in 1000), death (1 in 1000), kidney failure [usually temporary] (1 in 500), bleeding (1  in 200), allergic reaction [possibly serious] (1 in 200)], benefits (diagnostic support and management of coronary artery disease) and alternatives of a cardiac catheterization were discussed in detail with Mr. Ruecker and he is willing to proceed.   For questions or updates, please contact [place] Please consult [email or web address] for contact info under Cardiology/STEMI.    Signed, Christell Faith, PA-C [place] Pager: [phone] 01/09/2022, 7:45 AM

## 2022-01-10 ENCOUNTER — Other Ambulatory Visit: Payer: Self-pay

## 2022-01-10 DIAGNOSIS — R001 Bradycardia, unspecified: Secondary | ICD-10-CM | POA: Diagnosis not present

## 2022-01-10 DIAGNOSIS — T68XXXA Hypothermia, initial encounter: Secondary | ICD-10-CM

## 2022-01-10 LAB — BASIC METABOLIC PANEL
Anion gap: 10 (ref 5–15)
BUN: 19 mg/dL (ref 8–23)
CO2: 23 mmol/L (ref 22–32)
Calcium: 9.3 mg/dL (ref 8.9–10.3)
Chloride: 112 mmol/L — ABNORMAL HIGH (ref 98–111)
Creatinine, Ser: 1.66 mg/dL — ABNORMAL HIGH (ref 0.61–1.24)
GFR, Estimated: 46 mL/min — ABNORMAL LOW (ref >60)
Glucose, Bld: 77 mg/dL (ref 70–99)
Potassium: 3.5 mmol/L (ref 3.5–5.1)
Sodium: 145 mmol/L (ref 135–145)

## 2022-01-10 LAB — CBC
HCT: 30.5 % — ABNORMAL LOW (ref 39.0–52.0)
Hemoglobin: 9.2 g/dL — ABNORMAL LOW (ref 13.0–17.0)
MCH: 26.9 pg (ref 26.0–34.0)
MCHC: 30.2 g/dL (ref 30.0–36.0)
MCV: 89.2 fL (ref 80.0–100.0)
Platelets: 207 10*3/uL (ref 150–400)
RBC: 3.42 MIL/uL — ABNORMAL LOW (ref 4.22–5.81)
RDW: 17.7 % — ABNORMAL HIGH (ref 11.5–15.5)
WBC: 4.6 10*3/uL (ref 4.0–10.5)
nRBC: 0 % (ref 0.0–0.2)

## 2022-01-10 LAB — MAGNESIUM: Magnesium: 2.1 mg/dL (ref 1.7–2.4)

## 2022-01-10 LAB — GLUCOSE, CAPILLARY: Glucose-Capillary: 81 mg/dL (ref 70–99)

## 2022-01-10 LAB — APTT
aPTT: 76 seconds — ABNORMAL HIGH (ref 24–36)
aPTT: 64 seconds — ABNORMAL HIGH (ref 24–36)

## 2022-01-10 LAB — HEPARIN LEVEL (UNFRACTIONATED): Heparin Unfractionated: 0.86 IU/mL — ABNORMAL HIGH (ref 0.30–0.70)

## 2022-01-10 MED ORDER — HYDRALAZINE HCL 50 MG PO TABS
50.0000 mg | ORAL_TABLET | Freq: Three times a day (TID) | ORAL | Status: DC
  Administered 2022-01-10 – 2022-01-11 (×3): 50 mg via ORAL
  Filled 2022-01-10 (×3): qty 1

## 2022-01-10 MED ORDER — HEPARIN BOLUS VIA INFUSION
1000.0000 [IU] | Freq: Once | INTRAVENOUS | Status: DC
  Filled 2022-01-10: qty 1000

## 2022-01-10 MED ORDER — HALOPERIDOL LACTATE 5 MG/ML IJ SOLN: 2.0000 mg | Freq: Once | INTRAMUSCULAR | Status: DC

## 2022-01-10 MED ORDER — HALOPERIDOL LACTATE 5 MG/ML IJ SOLN
INTRAMUSCULAR | Status: AC
  Administered 2022-01-10: 5 mg
  Filled 2022-01-10: qty 1

## 2022-01-10 MED ORDER — LEVOFLOXACIN 500 MG PO TABS
500.0000 mg | ORAL_TABLET | Freq: Every day | ORAL | Status: DC
  Administered 2022-01-11: 500 mg via ORAL
  Filled 2022-01-10: qty 1

## 2022-01-10 MED ORDER — APIXABAN 5 MG PO TABS
5.0000 mg | ORAL_TABLET | Freq: Two times a day (BID) | ORAL | Status: DC
  Administered 2022-01-10 – 2022-01-11 (×2): 5 mg via ORAL
  Filled 2022-01-10 (×2): qty 1

## 2022-01-10 NOTE — Plan of Care (Signed)
  Problem: Health Behavior/Discharge Planning: Goal: Ability to manage health-related needs will improve Outcome: Progressing   Problem: Clinical Measurements: Goal: Will remain free from infection Outcome: Progressing   

## 2022-01-10 NOTE — TOC Progression Note (Addendum)
Transition of Care (TOC) - Progression Note    Patient Details  Name: Evan MCCUTCHAN Sr. MRN: [id number] Date of Birth: 05/23/1956  Transition of Care [facility]) CM/SW Contact  Merrily Brittle, [place] Phone Number: 01/10/2022, 12:10 PM  Clinical Narrative:     Patient has no SNF bed offers at this time. CSW extended bed search.  Expected Discharge Plan: Skilled Nursing Facility Barriers to Discharge: Continued Medical Work up  Expected Discharge Plan and Services Expected Discharge Plan: Skilled Nursing Facility       Living arrangements for the past 2 months: Single Family Home                                       Social Determinants of Health (SDOH) Interventions    Readmission Risk Interventions     View : No data to display.

## 2022-01-10 NOTE — Progress Notes (Signed)
Patient has decreased core temp. However, MD aware and states to use warm blankets.

## 2022-01-10 NOTE — Progress Notes (Signed)
  Progress Note   Patient: Evan Townsend [id number] DOB: Jan 02, 1956 DOA: 01/06/2022     3 DOS: the patient was seen and examined on 01/10/2022   Brief hospital course: No notes on file  Assessment and Plan: * Symptomatic bradycardia --BP stable.  Mobitz type I second-degree AV block, 2-1 AV block on Zio monitor. --Seen by cardiology and EP.  Cardiac MRI with no infiltrative cardiomyopathy.  Left heart cath non-obstructive. Plan: --no plan for pacemaker.  Outpatient cardio f/u.  Sepsis due to pneumonia ([place]) - mild leukopenia and hypothermia, however, no cough or hypoxia.  CT chest showed Mild right basilar and left lower lobe linear scarring, atelectasis and/or infiltrate. --received 3 days of ceftriaxone and azithro Plan: --switch to Levaquin to finish 5 days of empiric tx  Hypertension --BP varied widely Plan: --cont home amlodipine --start oral hydralazine 50 mg q8h  Hypothermia --per wife, this condition was present during last hospitalization.  Pt appeared asymptomatic.   Plan: --no need for Bair hugger if other vitals stable and pt asymptomatic   Acute on chronic anemia --anemia workup showed iron def --cont oral iron supplement (new)  Dyslipidemia - continue statin therapy.  Paroxysmal atrial fibrillation (HCC) --currently in NSR --transition back to home Eliquis  GERD without esophagitis - continue PPI therapy  BPH (benign prostatic hyperplasia) - continue his Flomax.  History of stroke --with left-sided hemiparesis and dysarthria  --cont ASA and statin         Subjective:  Nursing noted pt to be combative and confused this morning.     Physical Exam:  Constitutional: NAD, alert, sitting at edge of bed, slurred speech HEENT: conjunctivae and lids normal, EOMI CV: No cyanosis.   RESP: normal respiratory effort, on RA Neuro: II - XII grossly intact.     Data Reviewed:  Family Communication: wife and family updated at bedside  today  Disposition: Status is: Inpatient   Planned Discharge Destination: Rehab    Time spent: 50 minutes  Author: Enzo Bi, MD 01/10/2022 5:44 PM  For on call review [email or web address].

## 2022-01-10 NOTE — Progress Notes (Signed)
Earlier in the shift patient was combative, confused, and refusing medications, patient previously had been alert and oriented x 4; Dr. Fran Lowes notified, no new orders at that time.  Patient later in the morning started hitting tech, became increasingly aggressive and impulsive including attempting to pull out PIVs; Dr. Fran Lowes updated and responded to leave the patient alone and discontinue Bair hugger, PIVs and cardiac monitoring; updated Dr. Fran Lowes of patient's continued low temperature readings, Dr. Fran Lowes desires for Bair hugger to remain off.  Reached out to charge nurse, Haldol IV ordered by Dr. Fran Lowes.  Patient with some improvement from Haldol and continues with sitter.  Patient is being transferred to room 143, report given to receiving nurse, patient transferred to floor in bed.

## 2022-01-10 NOTE — Progress Notes (Signed)
Re-checked temperature, hospitalist informed. Warm blanket applied.

## 2022-01-10 NOTE — Consult Note (Signed)
ANTICOAGULATION CONSULT NOTE  Pharmacy Consult for IV Heparin Indication: atrial fibrillation (apixaban previously, last dose 5/24 at 2100)  Patient Measurements: Height: 5\' 6"  (167.6 cm) Weight: 67.5 kg (148 lb 13 oz) IBW/kg (Calculated) : 63.8 Heparin Dosing Weight: 67.5 kg  Labs: Recent Labs    01/08/22 0430 01/08/22 1605 01/09/22 0123 01/09/22 0954 01/10/22 0111 01/10/22 0653  HGB 8.7*  --  8.3*  --  9.2*  --   HCT 28.3*  --  26.6*  --  30.5*  --   PLT 206  --  163  --  207  --   APTT  --    < > 189* 106* 76* 64*  HEPARINUNFRC  --   --  >1.10* >1.10* 0.86*  --   CREATININE 1.83*  --  1.81*  --  1.66*  --    < > = values in this interval not displayed.     Estimated Creatinine Clearance: 40.6 mL/min (A) (by C-G formula based on SCr of 1.66 mg/dL (H)).   Medical History: Past Medical History:  Diagnosis Date   Carotid arterial disease (HCC)    a. 08/2018 Carotid U/S: min-mod RICA atherosclerosis w/o hemodynamically significant stenosis. Nl LICA.   Diabetes 1.5, managed as type 2 (HCC)    Diastolic dysfunction    a. 08/2018 Echo: EF 65%. No rwma. Gr1 DD. Mild MR.   Diastolic dysfunction    a. 08/2019 Echo: EF 55-60%, Gr1 DD. No rwma. Mild MR. RVSP 37.29mmHg.   Hypercholesterolemia    Hypertension    PAF (paroxysmal atrial fibrillation) (HCC)    a. 10/2019 Event Monitor: PAF; b. CHA2DS2VASc = 5-->Eliquis.   Poorly controlled diabetes mellitus (HCC)    a. 04/2019 A1c 13.8.   Recurrent strokes (HCC)    a. 10/2016 MRI/A: Acute 77mm R thalamic infarct, ? subacute infarct of R corona radiata; b. 08/2017 MRI/A: Acute 89mm lateral L thalamic infarct. Other more remote lacunar infarcts of thalami bilat. Small vessel dzs; c. 08/2018 MRI/A: Acute lacunar infarct of the post limb of R internal capsule; d. 08/2019 MRI Acute CVA of L paramedian pons adn R cerebellar hemisphere.   Tobacco abuse     Medications:  Apixaban 5 mg BID (last dose 5/24 at 2100)  Assessment: Patient is a 66  y/o M with medical history as above and including Afib and recurrent strokes on apixaban who is admitted with symptomatic bradycardia in the setting of pneumonia. Cardiology following and plan is for cardiac catheterization. Apixaban has been placed on hold. Pharmacy consulted for management of heparin infusion for Afib. Now s/p cardiac catheterization/left heart catheterization with Dr. 77 today. Pharmacy received instructions to restart heparin 2 hours after radial band removal, which occurred at 1630 per RN  CBC is stable. Baseline aPTT elevated to 42s. INR 1.2.   Goal of Therapy:  aPTT 66 - 102 seconds Monitor platelets by anticoagulation protocol: Yes   Plan:  5/27 @ 0653 : aPTT = 64,  subtherapeutic Bolus 1000 units x 1 and increase infusion to 700 units/h. aPTT and HL still not correlating, therefore will recheck aPTT 6 hours after rate change. Will recheck HL on 5/28 with AM labs.   Empress Newmann A Jorma Tassinari 01/10/2022,8:14 AM

## 2022-01-10 NOTE — Assessment & Plan Note (Signed)
--  per wife, this condition was present during last hospitalization.  Pt appeared asymptomatic.   Plan: --no need for Bair hugger if other vitals stable and pt asymptomatic

## 2022-01-10 NOTE — Plan of Care (Signed)
Continuing with plan of care. 

## 2022-01-10 NOTE — Consult Note (Signed)
Evan Townsend for IV Heparin Indication: atrial fibrillation (apixaban previously, last dose 5/24 at 2100)  Patient Measurements: Height: 5\' 6"  (167.6 cm) Weight: 67.5 kg (148 lb 13 oz) IBW/kg (Calculated) : 63.8 Heparin Dosing Weight: 67.5 kg  Labs: Recent Labs    01/07/22 0320 01/07/22 0321 01/08/22 0430 01/08/22 1605 01/09/22 0123 01/09/22 0954 01/10/22 0111  HGB  --    < > 8.7*  --  8.3*  --  9.2*  HCT  --    < > 28.3*  --  26.6*  --  30.5*  PLT  --    < > 206  --  163  --  207  APTT  --   --   --    < > 189* 106* 76*  HEPARINUNFRC  --   --   --   --  >1.10* >1.10* 0.86*  CREATININE  --    < > 1.83*  --  1.81*  --  1.66*  TROPONINIHS 8  --   --   --   --   --   --    < > = values in this interval not displayed.     Estimated Creatinine Clearance: 40.6 mL/min (A) (by C-G formula based on SCr of 1.66 mg/dL (H)).   Medical History: Past Medical History:  Diagnosis Date   Carotid arterial disease ([place])    a. 08/2018 Carotid U/S: min-mod RICA atherosclerosis w/o hemodynamically significant stenosis. Nl LICA.   Diabetes 1.5, managed as type 2 ([place])    Diastolic dysfunction    a. 08/2018 Echo: EF 65%. No rwma. Gr1 DD. Mild MR.   Diastolic dysfunction    a. 08/2019 Echo: EF 55-60%, Gr1 DD. No rwma. Mild MR. RVSP 37.31mmHg.   Hypercholesterolemia    Hypertension    PAF (paroxysmal atrial fibrillation) ([place])    a. 10/2019 Event Monitor: PAF; b. CHA2DS2VASc = 5-->Eliquis.   Poorly controlled diabetes mellitus ([place])    a. 04/2019 A1c 13.8.   Recurrent strokes ([place])    a. 10/2016 MRI/A: Acute 58mm R thalamic infarct, ? subacute infarct of R corona radiata; b. 08/2017 MRI/A: Acute 26mm lateral L thalamic infarct. Other more remote lacunar infarcts of thalami bilat. Small vessel dzs; c. 08/2018 MRI/A: Acute lacunar infarct of the post limb of R internal capsule; d. 08/2019 MRI Acute CVA of L paramedian pons adn R cerebellar hemisphere.   Tobacco abuse      Medications:  Apixaban 5 mg BID (last dose 5/24 at 2100)  Assessment: Patient is a 66 y/o M with medical history as above and including Afib and recurrent strokes on apixaban who is admitted with symptomatic bradycardia in the setting of pneumonia. Cardiology following and plan is for cardiac catheterization. Apixaban has been placed on hold. Pharmacy consulted for management of heparin infusion for Afib. Now s/p cardiac catheterization/left heart catheterization with Dr. Saunders Revel today. Pharmacy received instructions to restart heparin 2 hours after radial band removal, which occurred at 1630 per RN  CBC is stable. Baseline aPTT elevated to 42s. INR 1.2.   Goal of Therapy:  aPTT 66 - 102 seconds Monitor platelets by anticoagulation protocol: Yes   Plan:  5/27 @ 0111 : aPTT = 76,  HL = 0.86 aPTT is therapeutic X 1 but HL remains elevated Will draw confirmation aPTT on 5/27 @ 0700. Will recheck HL on 5/28 with AM labs.   Evan Townsend D 01/10/2022,2:19 AM

## 2022-01-11 DIAGNOSIS — R001 Bradycardia, unspecified: Secondary | ICD-10-CM | POA: Diagnosis not present

## 2022-01-11 DIAGNOSIS — N401 Enlarged prostate with lower urinary tract symptoms: Secondary | ICD-10-CM | POA: Diagnosis not present

## 2022-01-11 DIAGNOSIS — N138 Other obstructive and reflux uropathy: Secondary | ICD-10-CM | POA: Diagnosis not present

## 2022-01-11 DIAGNOSIS — R339 Retention of urine, unspecified: Secondary | ICD-10-CM | POA: Diagnosis not present

## 2022-01-11 DIAGNOSIS — N35911 Unspecified urethral stricture, male, meatal: Secondary | ICD-10-CM | POA: Diagnosis not present

## 2022-01-11 MED ORDER — OMEPRAZOLE 40 MG PO CPDR: 40.0000 mg | DELAYED_RELEASE_CAPSULE | Freq: Two times a day (BID) | ORAL | Status: DC

## 2022-01-11 MED ORDER — ATORVASTATIN CALCIUM 80 MG PO TABS: 80.0000 mg | ORAL_TABLET | Freq: Every evening | ORAL | Status: DC

## 2022-01-11 MED ORDER — CHLORHEXIDINE GLUCONATE CLOTH 2 % EX PADS: 6.0000 | MEDICATED_PAD | Freq: Every day | CUTANEOUS | Status: DC

## 2022-01-11 MED ORDER — POLYSACCHARIDE IRON COMPLEX 150 MG PO CAPS: 150.0000 mg | ORAL_CAPSULE | Freq: Every day | ORAL | Status: DC

## 2022-01-11 NOTE — Consult Note (Signed)
Urology Consult   Physician requesting consult: Enzo Bi, MD  Reason for consult: Urinary retention, difficult foley  History of Present Illness: Evan MARSZALEK Sr. is a 65 y.o. admitted for symptomatic bradycardia and sepsis due to pneumonia was found to have urinary retention.  He was voiding spontaneously.  A 200 mL void was documented yesterday.  Bladder scan performed at 6 AM this morning showed 878 mL.  Attempts at in and out catheterization were unsuccessful.  Placement of a Foley catheter by the nursing staff was unsuccessful as well.   Urology consultation was requested for the patient's retention and difficult Foley placement. The patient denies any problems voiding prior to his admission.  No prior urologic surgery.  No dysuria or gross hematuria.   He was on tamsulosin at the time of admission. He had a prior episode of urinary retention in January 2023 requiring Foley catheter placement.  Prostate volume measured 54 g.  He was previously seen by Dr. Diamantina Providence.  Urinalysis on 01/07/2022 showed 0-5 RBCs, 0-5 WBCs, and rare bacteria. Urine culture from 01/07/2022 grew 30 K staph and 50 K Enterococcus. Creatinine slightly elevated at 1.66 on 01/10/2022.  Past Medical History:  Diagnosis Date   Carotid arterial disease ([place])    a. 08/2018 Carotid U/S: min-mod RICA atherosclerosis w/o hemodynamically significant stenosis. Nl LICA.   Diabetes 1.5, managed as type 2 ([place])    Diastolic dysfunction    a. 08/2018 Echo: EF 65%. No rwma. Gr1 DD. Mild MR.   Diastolic dysfunction    a. 08/2019 Echo: EF 55-60%, Gr1 DD. No rwma. Mild MR. RVSP 37.75mHg.   Hypercholesterolemia    Hypertension    PAF ([place]oxysmal atrial fibrillation) ([place]    a. 10/2019 Event Monitor: PAF; b. CHA2DS2VASc = 5-->Eliquis.   Poorly controlled diabetes mellitus ([place]    a. 04/2019 A1c 13.8.   Recurrent strokes ([place]    a. 10/2016 MRI/A: Acute 552mR thalamic infarct, ? subacute infarct of R corona radiata; b. 08/2017 MRI/A:  Acute 65m60materal L thalamic infarct. Other more remote lacunar infarcts of thalami bilat. Small vessel dzs; c. 08/2018 MRI/A: Acute lacunar infarct of the post limb of R internal capsule; d. 08/2019 MRI Acute CVA of L paramedian pons adn R cerebellar hemisphere.   Tobacco abuse     Past Surgical History:  Procedure Laterality Date   ESOPHAGOGASTRODUODENOSCOPY (EGD) WITH PROPOFOL N/A 04/26/2019   Procedure: ESOPHAGOGASTRODUODENOSCOPY (EGD) WITH PROPOFOL;  Surgeon: VanLin LandsmanD;  Location: [facility];  Service: Gastroenterology;  Laterality: N/A;   LEFT HEART CATH AND CORONARY ANGIOGRAPHY N/A 01/09/2022   Procedure: LEFT HEART CATH AND CORONARY ANGIOGRAPHY;  Surgeon: EndNelva BushD;  [facility];  Service: Cardiovascular;  Laterality: N/A;   NO PAST SURGERIES      Medications:  Scheduled Meds:   stroke: early stages of recovery book   Does not apply Once   amLODipine  10 mg Oral Daily   apixaban  5 mg Oral BID   atorvastatin  80 mg Oral QPM   Chlorhexidine Gluconate Cloth  6 each Topical Q0600   dapagliflozin propanediol  10 mg Oral QAC breakfast   haloperidol lactate  2 mg Intravenous Once   hydrALAZINE  50 mg Oral Q8H   iron polysaccharides  150 mg Oral Daily   levofloxacin  500 mg Oral Daily   mirtazapine  7.5 mg Oral QHS   pantoprazole  80 mg Oral Daily   sodium chloride flush  3  mL Intravenous Q12H   sodium chloride flush  3 mL Intravenous Q12H   tamsulosin  0.4 mg Oral Daily   Continuous Infusions:  sodium chloride     sodium chloride     PRN Meds:.sodium chloride, sodium chloride, acetaminophen **OR** acetaminophen, albuterol, hydrALAZINE, magnesium hydroxide, nitroGLYCERIN, ondansetron **OR** ondansetron (ZOFRAN) IV, sodium chloride flush, sodium chloride flush  Allergies: No Known Allergies  Family History  Problem Relation Age of Onset   Hypertension Mother    Stroke Mother        died @ age 40   Hypertension Father     Diabetes Father    Heart attack Father        died @ 22    Social History:  reports that he has quit smoking. His smoking use included cigarettes. He started smoking about 41 years ago. He has a 38.00 pack-year smoking history. He has never used smokeless tobacco. He reports that he does not currently use alcohol after a past usage of about 1.0 standard drink per week. He reports that he does not currently use drugs after having used the following drugs: Cocaine and Marijuana.  ROS: A complete review of systems was performed.  All systems are negative except for pertinent findings as noted.  Physical Exam:  Vital signs in last 24 hours: Temp:  [91.7 F (33.2 C)-98.4 F (36.9 C)] 97.3 F (36.3 C) (05/28 1034) Pulse Rate:  [52-81] 52 (05/28 0801) Resp:  [17-18] 18 (05/28 0801) BP: (128-143)/(69-86) 130/82 (05/28 0801) SpO2:  [98 %-100 %] 100 % (05/28 0801) GENERAL APPEARANCE:   Well developed, well nourished, NAD HEENT:  Atraumatic, normocephalic, oropharynx clear NECK:  Supple without lymphadenopathy or thyromegaly ABDOMEN:  Soft, non-tender, SP distention EXTREMITIES:  Without clubbing, cyanosis, or edema NEUROLOGIC:  Alert and oriented x 3, CN II-XII grossly intact MENTAL STATUS:  appropriate BACK:  Non-tender to palpation, No CVAT SKIN:  Warm, dry, and intact GU:  circumcised phallus with meatal stenosis; scrotum without erythema or edema; testes nontender and without mass bilaterally  Laboratory Data:  Recent Labs    01/09/22 0123 01/10/22 0111  WBC 4.3 4.6  HGB 8.3* 9.2*  HCT 26.6* 30.5*  PLT 163 207    Recent Labs    01/09/22 0123 01/10/22 0111  NA 144 145  K 3.6 3.5  CL 116* 112*  GLUCOSE 126* 77  BUN 22 19  CALCIUM 8.7* 9.3  CREATININE 1.81* 1.66*     No results found for this or any previous visit (from the past 24 hour(s)). Recent Results (from the past 240 hour(s))  Urine Culture     Status: Abnormal   Collection Time: 01/07/22 12:33 AM    Specimen: In/Out Cath Urine  Result Value Ref Range Status   Specimen Description   Final    IN/OUT CATH URINE Performed at [facility], [address], [place], [place] [address]    Special Requests   Final    NONE Performed at [facility], [place], [place] [address]    Culture (A)  Final    30,000 COLONIES/mL STAPHYLOCOCCUS HAEMOLYTICUS 50,000 COLONIES/mL ENTEROCOCCUS FAECALIS    Report Status 01/09/2022 FINAL  Final   Organism ID, Bacteria STAPHYLOCOCCUS HAEMOLYTICUS (A)  Final   Organism ID, Bacteria ENTEROCOCCUS FAECALIS (A)  Final      Susceptibility   Enterococcus faecalis - MIC*    AMPICILLIN <=2 SENSITIVE Sensitive     NITROFURANTOIN <=16 SENSITIVE Sensitive  VANCOMYCIN 1 SENSITIVE Sensitive     * 50,000 COLONIES/mL ENTEROCOCCUS FAECALIS   Staphylococcus haemolyticus - MIC*    CIPROFLOXACIN >=8 RESISTANT Resistant     GENTAMICIN <=0.5 SENSITIVE Sensitive     NITROFURANTOIN <=16 SENSITIVE Sensitive     OXACILLIN >=4 RESISTANT Resistant     TETRACYCLINE <=1 SENSITIVE Sensitive     VANCOMYCIN <=0.5 SENSITIVE Sensitive     TRIMETH/SULFA <=10 SENSITIVE Sensitive     CLINDAMYCIN >=8 RESISTANT Resistant     RIFAMPIN <=0.5 SENSITIVE Sensitive     Inducible Clindamycin NEGATIVE Sensitive     * 30,000 COLONIES/mL STAPHYLOCOCCUS HAEMOLYTICUS  Blood culture (routine single)     Status: None (Preliminary result)   Collection Time: 01/07/22 12:33 AM   Specimen: BLOOD  Result Value Ref Range Status   Specimen Description BLOOD RIGHT ARM  Final   Special Requests   Final    BOTTLES DRAWN AEROBIC AND ANAEROBIC Blood Culture adequate volume   Culture   Final    NO GROWTH 4 DAYS Performed at [facility], [address] [place], [place] [address]    Report Status PENDING  Incomplete  Culture, blood (single)     Status: None (Preliminary result)   Collection Time: 01/07/22  3:21 AM   Specimen: BLOOD  Result Value Ref Range  Status   Specimen Description BLOOD RIGHT FA  Final   Special Requests   Final    BOTTLES DRAWN AEROBIC AND ANAEROBIC Blood Culture results may not be optimal due to an excessive volume of blood received in culture bottles   Culture   Final    NO GROWTH 4 DAYS Performed at [facility], [address] [place], [place] [address]    Report Status PENDING  Incomplete  MRSA Next Gen by PCR, Nasal     Status: None   Collection Time: 01/07/22  4:59 AM   Specimen: Nasal Mucosa; Nasal Swab  Result Value Ref Range Status   MRSA by PCR Next Gen NOT DETECTED NOT DETECTED Final    Comment: (NOTE) The GeneXpert MRSA Assay (FDA approved for NASAL specimens only), is one component of a comprehensive MRSA colonization surveillance program. It is not intended to diagnose MRSA infection nor to guide or monitor treatment for MRSA infections. Test performance is not FDA approved in patients less than 49 years old. Performed at [facility], [address] [place], [place] [address]     Renal Function: Recent Labs    01/06/22 1319 01/06/22 1621 01/07/22 0321 01/08/22 0430 01/09/22 0123 01/10/22 0111  CREATININE 1.48* 1.65* 1.57* 1.83* 1.81* 1.66*   Estimated Creatinine Clearance: 40.6 mL/min (A) (by C-G formula based on SCr of 1.66 mg/dL (H)).  Radiologic Imaging: CARDIAC CATHETERIZATION  Result Date: 01/09/2022 Conclusions:  Mild-moderate, non-obstructive coronary artery disease with up to 50% stenoses involving D2, small mid LCx, and ostial RCA. Normal left ventricular filling pressure (LVEDP 12 mmHg). Recommendations: Medical therapy and risk factor modification to prevent progression of coronary artery disease. Ongoing management of symptomatic bradycardia per primary teams and electrophysiology. Nelva Bush, MD [facility]    I independently reviewed the above imaging studies.  Procedure:  Meatal dilation, foley catheter insertion Under sterile conditions, I  attempted to place a 16 French Foley but met resistance at the meatus.  Using urethral dilators, the meatus and distal urethra were dilated from 8 [place] to 66 [place].  A 14 French Foley catheter was placed without difficulty following dilation.  There was  immediate return of approximately 900 mL of clear urine.  10 mils of sterile water was placed in the balloon.  The patient tolerated the procedure well.  Impression/Recommendation Urinary retention Meatal stenosis BPH with obstruction  Continue Foley catheter for 4-5 days following urethral dilation and due to urinary retention. Continue tamsulosin. Patient is currently on Levaquin. Recommend outpatient follow-up with [facility] after discharge.  Michaelle Birks 01/11/2022, 11:49 AM

## 2022-01-11 NOTE — TOC Progression Note (Signed)
Transition of Care (TOC) - Progression Note    Patient Details  Name: Evan FUDALA Sr. MRN: [id number] Date of Birth: 1956-02-15  [facility]) CM/SW Contact  Carmina Miller, LCSWA Phone Number: 01/11/2022, 9:11 AM  Clinical Narrative:     No bed offers at this time. TOC will continue to follow.    Expected Discharge Plan: Skilled Nursing Facility Barriers to Discharge: Continued Medical Work up  Expected Discharge Plan and Services Expected Discharge Plan: Skilled Nursing Facility       Living arrangements for the past 2 months: Single Family Home                                       Social Determinants of Health (SDOH) Interventions    Readmission Risk Interventions     View : No data to display.

## 2022-01-11 NOTE — Progress Notes (Signed)
Patient last void is 1730 yesterday 200 ml, bladder scan done 878 ml recorded, doctor notified, in and out catheter ordered. Attempted to in and out cath with fr.16 but the catheter can get in, resistance noted, patient started to squirt some urine, attempted to insert the fr 14 catheter still with resistance noted. Endorsed to bedside nurse on duty.

## 2022-01-11 NOTE — TOC Transition Note (Addendum)
Transition of Care [facility]) - CM/SW Discharge Note   Patient Details  Name: LINUS COMITO Sr. MRN: [id number] Date of Birth: 02-01-1956  Transition of Care [facility]) CM/SW Contact:  Loreta [address], [place] Phone Number: 01/11/2022, 3:53 PM   Clinical Narrative:    Update: per RN pt's wife would like DME delivered, CSW advised [employer], DME to be delivered tomorrow. CSW reached out to  [facility] but they are out of network with [facility]. CSW reached out to [place], spoke with Alwyn Ren, she states she will speak to the [place] on call and call CSW back. CSW reached out to [facility], had to leave a message for [place]. CSW reached out to [facility] with [facility] had to leave a vm. CSW reached out to pt's wife again to advise if [facility] services are not secured inhouse she could reach out to pt's PCP for further assistance, however no answer. CSW asked RN to relay the message.   CSW waiting to hear back from [place] on whether or not they can provide [facility] services for pt. Per pt's wife she is ok with dc even if HH is not in place. 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 Pt's wife requesting a wheelchair, shower chair, and shower head. CSW reached out to [employer], can provide wheelchair and shower chair but cannot provide showerhead. Per [employer] DME will be delivered to the room at the latest a little after 5:00 pm. MD made aware.     Barriers to Discharge: Continued Medical Work up   Patient Goals and CMS Choice Patient states their goals for this hospitalization and ongoing recovery are:: to go home CMS Medicare.gov Compare Post Acute Care list provided to:: Patient Represenative (must comment) (spouse Rise Paganini) Choice offered to / list presented to : Spouse  Discharge Placement                       Discharge Plan and Services                                     Social Determinants of Health (SDOH) Interventions     Readmission Risk Interventions      View : No data to display.

## 2022-01-11 NOTE — Discharge Summary (Incomplete)
Physician Discharge Summary   Evan TELLEFSEN Sr.  male DOB: Jan 17, 1956  GNF:[id number]  PCP: Charlynne Cousins, MD  Admit date: 01/06/2022 Discharge date: 01/11/2022  Admitted From: home Disposition:  home Wife updated at bedside prior to discharge. Home Health: Yes CODE STATUS: Full code  Discharge Instructions     Diet - low sodium heart healthy   Complete by: As directed       Hospital Course:  For full details, please see H&P, progress notes, consult notes and ancillary notes.  Briefly,  Evan Cones Sr. is a 66 y.o. 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    He was bradycardic in the ER with heart rate in the upper 30s and lower 40s.  It would come up to the 50s when he is awake.  * Symptomatic bradycardia --BP stable.  Mobitz type I second-degree AV block, 2-1 AV block on Zio monitor. --Seen by cardiology and EP.  Cardiac MRI with no infiltrative cardiomyopathy.  Left heart cath non-obstructive. --no plan for pacemaker.  Outpatient cardio f/u.   Sepsis due to pneumonia ([place]) - mild leukopenia and hypothermia, however, no cough or hypoxia.  CT chest showed Mild right basilar and left lower lobe linear scarring, atelectasis and/or infiltrate. --completed an empiric course of abx with 3 days of ceftriaxone and azithro f/b Levaquin.   Acute urinary retention --pt noted to have urinary retention on 5/28 with >800 ml post-void.  Primary RN and ICU charge RN were not able to insert urinary cath, so urology involved who placed a Foley cath after urethral dilation.   --Pt was discharged  with the Foley, and will follow up with outpatient urology Dr. Diamantina Providence.  Hypothermia --Pt's temp dropped intermittently to as low as 91.  Pt appeared asymptomatic, and other vitals signs stable.  Looking back at the last 3 hospitalizations, pt had had similar temp drops.  Unclear etiology.     Hypertension --BP varied widely --cont home amlodipine  Acute on chronic anemia --anemia workup showed iron def --cont oral iron supplement (new)   Dyslipidemia - continue statin therapy.   Paroxysmal atrial fibrillation (HCC) --currently in NSR --cont home Eliquis   GERD without esophagitis - continue PPI therapy   BPH (benign prostatic hyperplasia) - continue Flomax.   History of stroke --with left-sided hemiparesis and dysarthria  --cont ASA and statin    Discharge Diagnoses:  Principal Problem:   Symptomatic bradycardia Active Problems:   Sepsis due to pneumonia ([place])   Hypertension   History of stroke   BPH (benign prostatic hyperplasia)   GERD without esophagitis   Paroxysmal atrial fibrillation (HCC)   Dyslipidemia   Acute on chronic anemia   Hypothermia   30 Day Unplanned Readmission Risk Score    Flowsheet Row ED to Hosp-Admission (Current) from 01/06/2022 in [place] (1A)  30 Day Unplanned Readmission Risk Score (%) 32.76 Filed at 01/11/2022 1200       This score is the patient's risk of an unplanned readmission within 30 days of being discharged (0 -100%). The score is based on dignosis, age, lab data, medications, orders, and past utilization.   Low:  0-14.9  Medium: 15-21.9   High: 22-29.9   Extreme: 30 and above         Discharge Instructions:  Allergies as of 01/11/2022   No Known Allergies      Medication List     TAKE these medications    Accu-Chek Guide test strip Generic drug: glucose blood Check fsbs once daily   Accu-Chek Softclix Lancets lancets SMARTSIG:Topical   albuterol 108 (90 Base)  MCG/ACT inhaler Commonly known as: VENTOLIN HFA Inhale 2 puffs into the lungs every 6 (six) hours as needed for wheezing or shortness of breath.   amLODipine 10 MG tablet Commonly known as: NORVASC Take 1 tablet (10 mg total) by mouth daily.   apixaban 5 MG Tabs tablet Commonly known as: Eliquis Take 1 tablet (5 mg total) by mouth 2 (two) times daily.   atorvastatin 80 MG tablet Commonly known as: LIPITOR Take 1 tablet (80 mg total) by mouth every evening. Home med. What changed:  when to take this additional instructions   Blood Glucose Monitoring Suppl Kit Accucheck Guide with lancets and test strips #100 with one refill.  Test blood sugar twice daily. DX Code E11.65 ; Z79.4   Comfort Protect Adult Diaper/L Misc 1 Units by Does not apply route daily. Use 3-4 a day as needed   dapagliflozin propanediol 10 MG Tabs tablet Commonly known as: Farxiga Take 1 tablet (10 mg total) by mouth daily before breakfast. In place of glipizide   FreeStyle Libre 2 Sensor Misc 1 each by Does not apply route every 14 (fourteen) days.   Insulin Pen Needle 32G X 6 MM Misc Daily   iron polysaccharides 150 MG capsule Commonly known as: NIFEREX Take 1 capsule (150 mg total) by mouth daily. Can take any form of over-the-counter iron supplement. Start taking on: Jan 12, 2022   metoCLOPramide 5 MG tablet Commonly known as: REGLAN TAKE 1 TABLET BY MOUTH 3 TIMES DAILY BEFORE MEALS   mirtazapine 7.5 MG tablet Commonly known as: REMERON Take 1 tablet (7.5 mg total) by mouth at bedtime.   Misc. Devices Misc One pair of Compression stockings  Hemiplegia and hemiparesis following cerebral infarction affecting left non-dominant side (HCC)  - Primary Codes: N27.782 Lower extremity edema  Codes: R60.0 Type 2 diabetes mellitus with hyperglycemia, with long-term current use of insulin (HCC)  Codes: E11.65, Z79.4   nitroGLYCERIN 0.4 MG SL tablet Commonly known as: NITROSTAT Place 1 tablet (0.4  mg total) under the tongue every 5 (five) minutes as needed for chest pain.   omeprazole 40 MG capsule Commonly known as: PRILOSEC Take 1 capsule (40 mg total) by mouth in the morning and at bedtime. Home med. What changed: See the new instructions.   tamsulosin 0.4 MG Caps capsule Commonly known as: FLOMAX Take 1 capsule (0.4 mg total) by mouth daily.         Follow-up Information     Vigg, Avanti, MD Follow up in 1 week(s).   Specialty: Internal Medicine Contact information: [place] [address] [phone]         Vickie Epley, MD .   Specialties: Cardiology, Radiology Contact information: [place] [address] [address] [phone]         Billey Co, MD Follow up in 2 week(s).   Specialty: Urology Why: Foley removal and voiding trial Contact information: [place] [address] [phone]  No Known Allergies   The results of significant diagnostics from this hospitalization (including imaging, microbiology, ancillary and laboratory) are listed below for reference.   Consultations:   Procedures/Studies: DG Chest 2 View  Result Date: 01/06/2022 CLINICAL DATA:  A 66 year old male presents for evaluation of chest pain. EXAM: CHEST - 2 VIEW COMPARISON:  Dec 28, 2021. FINDINGS: Trachea midline. Mild cardiac enlargement, accentuated by portable technique and AP projection. Patchy linear basilar airspace disease without lobar consolidation. No pneumothorax. On lateral projection airspace disease appears more confluent likely in the LEFT lung base. On limited assessment no acute skeletal findings. IMPRESSION: Signs of atelectasis but with more confluent area of consolidation at the LEFT lung base that may represent volume loss or developing pneumonia. Electronically Signed   By: Zetta Bills M.D.   On: 01/06/2022 16:45   CT Head Wo Contrast  Result Date: 01/07/2022 CLINICAL  DATA:  Chest pain under the left nipple since last night which radiates to the left neck. EXAM: CT HEAD WITHOUT CONTRAST TECHNIQUE: Contiguous axial images were obtained from the base of the skull through the vertex without intravenous contrast. RADIATION DOSE REDUCTION: This exam was performed according to the departmental dose-optimization program which includes automated exposure control, adjustment of the mA and/or kV according to patient size and/or use of iterative reconstruction technique. COMPARISON:  Dec 28, 2021 FINDINGS: Brain: There is mild cerebral atrophy with widening of the extra-axial spaces and ventricular dilatation. There are areas of decreased attenuation within the white matter tracts of the supratentorial brain, consistent with microvascular disease changes. Vascular: No hyperdense vessel or unexpected calcification. Skull: Normal. Negative for fracture or focal lesion. Sinuses/Orbits: There is mild left maxillary sinus mucosal thickening Other: None. IMPRESSION: 1. No acute intracranial abnormality. 2. Generalized cerebral atrophy with chronic white matter small vessel ischemic changes. Electronically Signed   By: Virgina Norfolk M.D.   On: 01/07/2022 00:08   CT Chest Wo Contrast  Result Date: 01/07/2022 CLINICAL DATA:  Chest pain. EXAM: CT CHEST WITHOUT CONTRAST TECHNIQUE: Multidetector CT imaging of the chest was performed following the standard protocol without IV contrast. RADIATION DOSE REDUCTION: This exam was performed according to the departmental dose-optimization program which includes automated exposure control, adjustment of the mA and/or kV according to patient size and/or use of iterative reconstruction technique. COMPARISON:  September 14, 2021 FINDINGS: Cardiovascular: No significant vascular findings. There is mild cardiomegaly with moderate severity coronary artery calcification. A trace amount of pericardial fluid is noted. Mediastinum/Nodes: No enlarged mediastinal or  axillary lymph nodes. Thyroid gland, trachea, and esophagus demonstrate no significant findings. Lungs/Pleura: Mild posteromedial right basilar and left lower lobe linear scarring, atelectasis and/or infiltrate is seen. This is mildly increased in severity when compared to the prior study. There is no evidence of a pleural effusion or pneumothorax Upper Abdomen: No acute abnormality. Musculoskeletal: No chest wall mass or suspicious bone lesions identified. IMPRESSION: 1. Mild right basilar and left lower lobe linear scarring, atelectasis and/or infiltrate. This is mildly increased in severity when compared to the prior study. 2. Mild cardiomegaly with moderate severity coronary artery calcification. Electronically Signed   By: Virgina Norfolk M.D.   On: 01/07/2022 00:12   MR BRAIN WO CONTRAST  Result Date: 12/28/2021 CLINICAL DATA:  Delirium. EXAM: MRI HEAD WITHOUT CONTRAST TECHNIQUE: Multiplanar, multiecho pulse sequences of the brain and surrounding structures were obtained without intravenous contrast. COMPARISON:  Head CT and CTA 12/28/2021.  Head MRI 09/01/2018. FINDINGS: Brain: There is no evidence of an  acute infarct, mass, midline shift, or extra-axial fluid collection. T2 hyperintensities in the cerebral white matter bilaterally have mildly progressed from the prior MRI and are nonspecific but compatible with moderate chronic small vessel ischemic disease. Multiple chronic lacunar infarcts are again noted involving the thalami and basal ganglia/deep white matter including posterior limb of the right internal capsule extending into the midbrain with wallerian degeneration. A small chronic right cerebellar infarct is new from the prior MRI. Chronic hemosiderin deposition is again seen in the right periatrial region and right basal ganglia. A chronic microhemorrhage in the left centrum semiovale appears new from the prior MRI. The ventricles are normal in size. Vascular: Major intracranial vascular flow  voids are preserved. Skull and upper cervical spine: Unremarkable bone marrow signal. Sinuses/Orbits: Unremarkable orbits. Mild mucosal thickening in the paranasal sinuses. Trace left mastoid effusion. Other: None. IMPRESSION: 1. No acute intracranial abnormality. 2. Age advanced chronic small vessel ischemic disease with numerous chronic lacunar infarcts, mildly progressed from 2020. Electronically Signed   By: Logan Bores M.D.   On: 12/28/2021 17:13   CARDIAC CATHETERIZATION  Result Date: 01/09/2022 Conclusions:  Mild-moderate, non-obstructive coronary artery disease with up to 50% stenoses involving D2, small mid LCx, and ostial RCA. Normal left ventricular filling pressure (LVEDP 12 mmHg). Recommendations: Medical therapy and risk factor modification to prevent progression of coronary artery disease. Ongoing management of symptomatic bradycardia per primary teams and electrophysiology. Nelva Bush, MD [facility]   DG Chest Portable 1 View  Result Date: 12/28/2021 CLINICAL DATA:  Altered level of consciousness, weakness EXAM: PORTABLE CHEST 1 VIEW COMPARISON:  11/26/2021 FINDINGS: Single frontal view of the chest demonstrates an unremarkable cardiac silhouette. No airspace disease, effusion, or pneumothorax. No acute bony abnormalities. IMPRESSION: 1. No acute intrathoracic process. Electronically Signed   By: Randa Ngo M.D.   On: 12/28/2021 16:09   MR CARDIAC MORPHOLOGY W WO CONTRAST  Result Date: 01/07/2022 CLINICAL DATA:  Hx of av block, evaluate for sarcoidosis EXAM: CARDIAC MRI TECHNIQUE: The patient was scanned on a 1.5 Tesla Siemens magnet. A dedicated cardiac coil was used. Functional imaging was done using Fiesta sequences. 2,3, and 4 chamber views were done to assess for RWMA's. Modified Simpson's rule using a short axis stack was used to calculate an ejection fraction on a dedicated work Conservation officer, nature. The patient received 10 cc of Gadavist. After 10 minutes  inversion recovery sequences were used to assess for infiltration and scar tissue. CONTRAST:  10 cc  of Gadavist FINDINGS: 1. Normal left ventricular size, thickness and systolic function (LVEF = 51%). There are no regional wall motion abnormalities. There is no late gadolinium enhancement in the left ventricular myocardium. Native T1 value 1029 ms ECV 48% LVEDV: 157 ml LVESV: 77 ml SV: 80 ml CO: 5.9 L/min Myocardial mass: 94g 2. Normal right ventricular size, thickness and systolic function (RVEF = 51%). There are no regional wall motion abnormalities. 3.  Normal left and right atrial size. 4. Normal size of the aortic root, ascending aorta and pulmonary artery. 5.  No significant valvular abnormalities. 6.  Normal pericardium.  Small pericardial effusion. IMPRESSION: 1.  Normal LV size and function, LVEF 51% 2.  Normal RV size and function. 3.  No delayed enhancement or scar noted 4.  Normal native T1 value, elevated ECV. 5. With native T1 value being normal, elevated ECV likely influenced by motion artifact. 6. No findings to suggest infiltrative disease such as sarcoid or amyloid. Electronically Signed  By: Kate Sable M.D.   On: 01/07/2022 19:14   CT HEAD CODE STROKE WO CONTRAST  Result Date: 12/28/2021 CLINICAL DATA:  Code stroke. Neuro deficit, acute, stroke suspected. EXAM: CT HEAD WITHOUT CONTRAST TECHNIQUE: Contiguous axial images were obtained from the base of the skull through the vertex without intravenous contrast. RADIATION DOSE REDUCTION: This exam was performed according to the departmental dose-optimization program which includes automated exposure control, adjustment of the mA and/or kV according to patient size and/or use of iterative reconstruction technique. COMPARISON:  Head CT 05/16/2021 and MRI 09/01/2018 FINDINGS: Brain: There is no evidence of an acute infarct, intracranial hemorrhage, mass, midline shift, or extra-axial fluid collection. Hypodensities in the cerebral white  matter bilaterally are unchanged and nonspecific but compatible with mild chronic small vessel ischemic disease. Chronic lacunar infarcts are again seen in the thalami and right cerebellar hemisphere. The ventricles and sulci are normal. Vascular: No hyperdense vessel. Skull: No fracture or suspicious osseous lesion. Sinuses/Orbits: Mucosal thickening and secretions in the left maxillary sinus. Clear mastoid air cells. Unremarkable orbits. Other: None. ASPECTS [facility] Stroke Program Early CT Score) - Ganglionic level infarction (caudate, lentiform nuclei, internal capsule, insula, M1-M3 cortex): 7 - Supraganglionic infarction (M4-M6 cortex): 3 Total score (0-10 with 10 being normal): 10 IMPRESSION: 1. No evidence of acute intracranial abnormality.  ASPECTS of 10. 2. Mild chronic small vessel ischemic disease with multiple chronic lacunar infarcts. These results were communicated to Dr. Curly Shores at 3:11 pm on 12/28/2021 by text page via the [facility] messaging system. Electronically Signed   By: Logan Bores M.D.   On: 12/28/2021 15:14   CT ANGIO HEAD NECK W WO CM W PERF (CODE STROKE)  Result Date: 12/28/2021 CLINICAL DATA:  Stroke follow-up. EXAM: CT ANGIOGRAPHY HEAD AND NECK TECHNIQUE: Multidetector CT imaging of the head and neck was performed using the standard protocol during bolus administration of intravenous contrast. Multiplanar CT image reconstructions and MIPs were obtained to evaluate the vascular anatomy. Carotid stenosis measurements (when applicable) are obtained utilizing NASCET criteria, using the distal internal carotid diameter as the denominator. RADIATION DOSE REDUCTION: This exam was performed according to the departmental dose-optimization program which includes automated exposure control, adjustment of the mA and/or kV according to patient size and/or use of iterative reconstruction technique. CONTRAST:  42m OMNIPAQUE IOHEXOL 350 MG/ML SOLN COMPARISON:  Head MRA 09/01/2018. Carotid Doppler  ultrasound 09/10/2017. Head and neck CTA 11/10/2016. FINDINGS: CTA NECK FINDINGS Aortic arch: Normal variant aortic arch branching pattern with common origin of the brachiocephalic and left common carotid arteries. Widely patent arch vessel origins. Right carotid system: Patent with calcified and soft plaque in the carotid bulb resulting in 40% ICA origin stenosis, mildly progressed from 2018. Left carotid system: Patent with minimal scattered plaque. No evidence of stenosis or dissection. Vertebral arteries: Patent and codominant without evidence of a significant stenosis or dissection. Skeleton: Absent dentition.  Moderate disc degeneration at C5-6. Other neck: No evidence of cervical lymphadenopathy or mass. Upper chest: No apical lung consolidation or mass. Review of the MIP images confirms the above findings CTA HEAD FINDINGS Anterior circulation: The internal carotid arteries are patent from skull base to carotid termini with at most mild cavernous and supraclinoid segment narrowing bilaterally. ACAs and MCAs are patent with scattered mild atherosclerotic irregularity but no evidence of a proximal branch occlusion or significant proximal stenosis. No aneurysm is identified. Posterior circulation: Intracranial vertebral arteries are widely patent to the basilar. Patent PICA and SCA origins are seen bilaterally.  The basilar artery is widely patent. There are patent posterior communicating arteries bilaterally with hypoplasia of the right P1 segment. Both PCAs are patent with mild asymmetric attenuation and irregularity of the right PCA but no evidence of a flow limiting proximal stenosis. No aneurysm is identified. Venous sinuses: Patent. Anatomic variants: Predominantly fetal type origin of the right PCA. Review of the MIP images confirms the above findings IMPRESSION: 1. Mild atherosclerosis in the head and neck without a large vessel occlusion or high-grade proximal stenosis. 2. 40% proximal right ICA  stenosis, mildly progressed from 2018. These results were communicated to Dr. Curly Shores at 3:11 pm on 12/28/2021 by text page via the [facility] messaging system. Electronically Signed   By: Logan Bores M.D.   On: 12/28/2021 15:47   US Abdomen Limited RUQ (LIVER/GB)  Result Date: 12/28/2021 CLINICAL DATA:  Right upper quadrant pain EXAM: ULTRASOUND ABDOMEN LIMITED RIGHT UPPER QUADRANT COMPARISON:  None Available. FINDINGS: Gallbladder: No gallstones or wall thickening visualized. No sonographic Murphy sign noted by sonographer. Common bile duct: Diameter: 0.4 cm Liver: No focal lesion identified. Within normal limits in parenchymal echogenicity. Portal vein is patent on color Doppler imaging with normal direction of blood flow towards the liver. Other: Incidental note of moderate right hydronephrosis. IMPRESSION: 1. No evidence of cholelithiasis or acute cholecystitis. 2. Incidental note of moderate right hydronephrosis. Electronically Signed   By: Delanna Ahmadi M.D.   On: 12/28/2021 17:36      Labs: BNP (last 3 results) No results for input(s): BNP in the last 8760 hours. Basic Metabolic Panel: Recent Labs  Lab 01/06/22 1621 01/07/22 0321 01/08/22 0430 01/09/22 0123 01/10/22 0111  NA 137 141 145 144 145  K 3.5 3.9 3.6 3.6 3.5  CL 106 113* 115* 116* 112*  CO2 23 23 23 24 23   GLUCOSE 174* 97 70 126* 77  BUN 25* 25* 21 22 19   CREATININE 1.65* 1.57* 1.83* 1.81* 1.66*  CALCIUM 8.8* 8.5* 8.7* 8.7* 9.3  MG  --   --  2.0 2.1 2.1   Liver Function Tests: Recent Labs  Lab 01/06/22 1319 01/06/22 2015  AST 37 41  ALT 77* 77*  ALKPHOS 181* 140*  BILITOT <0.2 0.4  PROT 6.7 7.1  ALBUMIN 4.0 3.4*   No results for input(s): LIPASE, AMYLASE in the last 168 hours. No results for input(s): AMMONIA in the last 168 hours. CBC: Recent Labs  Lab 01/06/22 1319 01/06/22 1621 01/07/22 0321 01/08/22 0430 01/09/22 0123 01/10/22 0111  WBC 2.6* 3.0* 3.7* 4.1 4.3 4.6  NEUTROABS 1.2*  --   --   --   --    --   HGB 9.0* 8.6* 7.9* 8.7* 8.3* 9.2*  HCT 26.9* 27.6* 25.3* 28.3* 26.6* 30.5*  MCV 85 88.7 88.2 89.0 89.9 89.2  PLT 206 212 181 206 163 207   Cardiac Enzymes: No results for input(s): CKTOTAL, CKMB, CKMBINDEX, TROPONINI in the last 168 hours. BNP: Invalid input(s): POCBNP CBG: Recent Labs  Lab 01/07/22 1535 01/07/22 1922 01/07/22 2324 01/08/22 0334 01/10/22 0202  GLUCAP 84 161* 66* 71 81   D-Dimer No results for input(s): DDIMER in the last 72 hours. Hgb A1c No results for input(s): HGBA1C in the last 72 hours. Lipid Profile No results for input(s): CHOL, HDL, LDLCALC, TRIG, CHOLHDL, LDLDIRECT in the last 72 hours. Thyroid function studies No results for input(s): TSH, T4TOTAL, T3FREE, THYROIDAB in the last 72 hours.  Invalid input(s): FREET3 Anemia work up [employer]  01/08/22 1605  VITAMINB12 396   Urinalysis    Component Value Date/Time   COLORURINE STRAW (A) 01/07/2022 0043   APPEARANCEUR HAZY (A) 01/07/2022 0043   APPEARANCEUR Clear 12/18/2021 1332   LABSPEC 1.005 01/07/2022 0043   PHURINE 5.0 01/07/2022 0043   GLUCOSEU >=500 (A) 01/07/2022 0043   HGBUR SMALL (A) 01/07/2022 0043   BILIRUBINUR NEGATIVE 01/07/2022 0043   BILIRUBINUR Negative 12/18/2021 1332   KETONESUR NEGATIVE 01/07/2022 0043   PROTEINUR 100 (A) 01/07/2022 0043   NITRITE NEGATIVE 01/07/2022 0043   LEUKOCYTESUR NEGATIVE 01/07/2022 0043   Sepsis Labs Invalid input(s): PROCALCITONIN,  WBC,  LACTICIDVEN Microbiology Recent Results (from the past 240 hour(s))  Urine Culture     Status: Abnormal   Collection Time: 01/07/22 12:33 AM   Specimen: In/Out Cath Urine  Result Value Ref Range Status   Specimen Description   Final    IN/OUT CATH URINE Performed at [facility], [address], [place], [place] [address]    Special Requests   Final    NONE Performed at [facility], [address] [place], [place] [address]    Culture (A)  Final    30,000 COLONIES/mL  STAPHYLOCOCCUS HAEMOLYTICUS 50,000 COLONIES/mL ENTEROCOCCUS FAECALIS    Report Status 01/09/2022 FINAL  Final   Organism ID, Bacteria STAPHYLOCOCCUS HAEMOLYTICUS (A)  Final   Organism ID, Bacteria ENTEROCOCCUS FAECALIS (A)  Final      Susceptibility   Enterococcus faecalis - MIC*    AMPICILLIN <=2 SENSITIVE Sensitive     NITROFURANTOIN <=16 SENSITIVE Sensitive     VANCOMYCIN 1 SENSITIVE Sensitive     * 50,000 COLONIES/mL ENTEROCOCCUS FAECALIS   Staphylococcus haemolyticus - MIC*    CIPROFLOXACIN >=8 RESISTANT Resistant     GENTAMICIN <=0.5 SENSITIVE Sensitive     NITROFURANTOIN <=16 SENSITIVE Sensitive     OXACILLIN >=4 RESISTANT Resistant     TETRACYCLINE <=1 SENSITIVE Sensitive     VANCOMYCIN <=0.5 SENSITIVE Sensitive     TRIMETH/SULFA <=10 SENSITIVE Sensitive     CLINDAMYCIN >=8 RESISTANT Resistant     RIFAMPIN <=0.5 SENSITIVE Sensitive     Inducible Clindamycin NEGATIVE Sensitive     * 30,000 COLONIES/mL STAPHYLOCOCCUS HAEMOLYTICUS  Blood culture (routine single)     Status: None (Preliminary result)   Collection Time: 01/07/22 12:33 AM   Specimen: BLOOD  Result Value Ref Range Status   Specimen Description BLOOD RIGHT ARM  Final   Special Requests   Final    BOTTLES DRAWN AEROBIC AND ANAEROBIC Blood Culture adequate volume   Culture   Final    NO GROWTH 4 DAYS Performed at [facility], [address] [place], [place] [address]    Report Status PENDING  Incomplete  Culture, blood (single)     Status: None (Preliminary result)   Collection Time: 01/07/22  3:21 AM   Specimen: BLOOD  Result Value Ref Range Status   Specimen Description BLOOD RIGHT FA  Final   Special Requests   Final    BOTTLES DRAWN AEROBIC AND ANAEROBIC Blood Culture results may not be optimal due to an excessive volume of blood received in culture bottles   Culture   Final    NO GROWTH 4 DAYS Performed at [facility], [address] [place], [place] [address]    Report Status  PENDING  Incomplete  MRSA Next Gen by PCR, Nasal     Status: None   Collection Time: 01/07/22  4:59 AM   Specimen:  Nasal Mucosa; Nasal Swab  Result Value Ref Range Status   MRSA by PCR Next Gen NOT DETECTED NOT DETECTED Final    Comment: (NOTE) The GeneXpert MRSA Assay (FDA approved for NASAL specimens only), is one component of a comprehensive MRSA colonization surveillance program. It is not intended to diagnose MRSA infection nor to guide or monitor treatment for MRSA infections. Test performance is not FDA approved in patients less than 39 years old. Performed at [facility], [address] [place], [place] [address]      Total time spend on discharging this patient, including the last patient exam, discussing the hospital stay, instructions for ongoing care as it relates to all pertinent caregivers, as well as preparing the medical discharge records, prescriptions, and/or referrals as applicable, is 45 minutes.    Enzo Bi, MD  [facility] 01/11/2022, 2:56 PM

## 2022-01-11 NOTE — Plan of Care (Signed)
  Problem: Education: Goal: Knowledge of General Education information will improve Description Including pain rating scale, medication(s)/side effects and non-pharmacologic comfort measures Outcome: Progressing   

## 2022-01-11 NOTE — Progress Notes (Signed)
Temperature re-checked, notified MD, bear hugger continued.

## 2022-01-11 NOTE — Progress Notes (Signed)
Discharge note:  Pt's wife given discharge instructions and verbalized understanding. Wife was told that the equipment cannot be delivered to their home until tomorrow and she was okay with that. Pt wheeled out via staff with his belongings and left via car driven by wife.

## 2022-01-12 DIAGNOSIS — R531 Weakness: Secondary | ICD-10-CM | POA: Diagnosis not present

## 2022-01-12 LAB — CULTURE, BLOOD (SINGLE)
Culture: NO GROWTH
Culture: NO GROWTH
Special Requests: ADEQUATE

## 2022-01-13 ENCOUNTER — Encounter: Payer: Medicaid Other | Admitting: Physical Therapy

## 2022-01-13 ENCOUNTER — Telehealth: Payer: Self-pay

## 2022-01-13 ENCOUNTER — Ambulatory Visit: Payer: Medicaid Other | Admitting: Internal Medicine

## 2022-01-13 ENCOUNTER — Encounter: Payer: Self-pay | Admitting: Internal Medicine

## 2022-01-13 VITALS — BP 127/71 | HR 54 | Temp 97.3°F | Ht 65.98 in | Wt 148.0 lb

## 2022-01-13 DIAGNOSIS — J189 Pneumonia, unspecified organism: Secondary | ICD-10-CM

## 2022-01-13 DIAGNOSIS — R001 Bradycardia, unspecified: Secondary | ICD-10-CM

## 2022-01-13 LAB — LIPOPROTEIN A (LPA): Lipoprotein (a): 313.8 nmol/L — ABNORMAL HIGH (ref <75.0)

## 2022-01-13 NOTE — Telephone Encounter (Signed)
Transition Care Management Unsuccessful Follow-up Telephone Call  Date of discharge and from where:  01/11/2022-[facility]  Attempts:  1st Attempt  Reason for unsuccessful TCM follow-up call:  Voice mail full

## 2022-01-13 NOTE — Patient Outreach (Signed)
Care Coordination  01/13/2022  Evan HAMMITT Sr. Feb 13, 1956 [id number]  Transition Care Management Follow-up Telephone Call Date of discharge and from where: 01/11/22 [facility] How have you been since you were released from the hospital? He is okay we are on the way to his doctor Any questions or concerns? No  Items Reviewed: Did the pt receive and understand the discharge instructions provided? Yes  Medications obtained and verified? No  Other? No  Any new allergies since your discharge? No  Dietary orders reviewed? No Do you have support at home? Yes   Home Care and Equipment/Supplies: Were home health services ordered? yes If so, what is the name of the agency? [facility]   Has the agency set up a time to come to the patient's home? yes Were any new equipment or medical supplies ordered?  Yes: Wheelchair and shower chair What is the name of the medical supply agency? [facility] Were you able to get the supplies/equipment? yes Do you have any questions related to the use of the equipment or supplies? No  Functional Questionnaire: (I = Independent and D = Dependent) ADLs: D  Bathing/Dressing- D  Meal Prep- D  Eating- D  Maintaining continence- D  Transferring/Ambulation- D  Managing Meds- D  Follow up appointments reviewed:  PCP Hospital f/u appt confirmed? Yes  Scheduled to see Vigg on 01/13/22 @ . Specialist Hospital f/u appt confirmed?  Scheduled to see . Are transportation arrangements needed?  If their condition worsens, is the pt aware to call PCP or go to the Emergency Dept.?  Was the patient provided with contact information for the PCP's office or ED?  Was to pt encouraged to call back with questions or concerns?

## 2022-01-13 NOTE — Patient Instructions (Signed)
Visit Information  Mr. Weissberg was given information about Medicaid Managed Care team care coordination services as a part of their Healthy [facility] Medicaid benefit. Sandie Ano Sr. verbally consented to engagement with the [facility] Managed Care team.   If you are experiencing a medical emergency, please call 911 or report to your local emergency department or urgent care.   If you have a non-emergency medical problem during routine business hours, please contact your provider's office and ask to speak with a nurse.   For questions related to your Healthy [facility] health plan, please call: [phone] or visit the homepage here: [email or web address]  If you would like to schedule transportation through your Healthy [facility] plan, please call the following number at least 2 days in advance of your appointment: [phone]  For information about your ride after you set it up, call Ride Assist at [phone]. Use this number to activate a Will Call pickup, or if your transportation is late for a scheduled pickup. Use this number, too, if you need to make a change or cancel a previously scheduled reservation.  If you need transportation services right away, call [phone]. The after-hours call center is staffed 24 hours to handle ride assistance and urgent reservation requests (including discharges) 365 days a year. Urgent trips include sick visits, hospital discharge requests and life-sustaining treatment.  Call the [facility] Line at [phone], at any time, 24 hours a day, 7 days a week. If you are in danger or need immediate medical attention call 911.  If you would like help to quit smoking, call 1-800-QUIT-NOW ((831)147-6859) OR Espaol: 1-855-Djelo-Ya (7-782-423-5361) o para ms informacin haga clic aqu or Text READY to 443-154 to register via text  Mr. Ollinger - following are the goals we discussed in your visit today:    Goals Addressed   None      Social Worker will follow up in 30 days .   Gus Puma, BSW, [facility]  [place]  High Risk Managed Medicaid Team  [phone]   Following is a copy of your plan of care:  There are no care plans that you recently modified to display for this patient.

## 2022-01-13 NOTE — Progress Notes (Signed)
BP 127/71   Pulse (!) 54   Temp (!) 97.3 F (36.3 C) (Oral)   Ht 5' 5.98" (1.676 m)   Wt 148 lb (67.1 kg)   SpO2 98%   BMI 23.90 kg/m    Subjective:    Patient ID: Evan Cones Sr., male    DOB: [birth year], 66 y.o.   MRN: [id number]  Chief Complaint  Patient presents with  . Hospitalization Follow-up    Patient is following up with urology tomorrow for foley removal     HPI: Evan PARTAIN Sr. is a 66 y.o. male  Pt is here for a fu, HR was low and pt had a cardiac cath to have a possible pacer. No more chest pain per pt. Pt was rx with pneumonia.  Pt was admitted x 5 days, to see card in [place] for the above.  Pt had diarrhea as well , pt was admitted with  hypothermia and had a diagnosis of pneumonia, discharge summary not availbale at time of this visit.     Chief Complaint  Patient presents with  . Hospitalization Follow-up    Patient is following up with urology tomorrow for foley removal     Relevant past medical, surgical, family and social history reviewed and updated as indicated. Interim medical history since our last visit reviewed. Allergies and medications reviewed and updated.  Review of Systems  Per HPI unless specifically indicated above     Objective:    BP 127/71   Pulse (!) 54   Temp (!) 97.3 F (36.3 C) (Oral)   Ht 5' 5.98" (1.676 m)   Wt 148 lb (67.1 kg)   SpO2 98%   BMI 23.90 kg/m   Wt Readings from Last 3 Encounters:  01/13/22 148 lb (67.1 kg)  01/07/22 148 lb 13 oz (67.5 kg)  01/01/22 145 lb (65.8 kg)    Physical Exam  Results for orders placed or performed during the hospital encounter of 01/06/22  Urine Culture   Specimen: In/Out Cath Urine  Result Value Ref Range   Specimen Description      IN/OUT CATH URINE Performed at [facility], [address] [place], [place] [address]    Special Requests      NONE Performed at [facility], [place], [place] [address]    Culture  (A)     30,000 COLONIES/mL STAPHYLOCOCCUS HAEMOLYTICUS 50,000 COLONIES/mL ENTEROCOCCUS FAECALIS    Report Status 01/09/2022 FINAL    Organism ID, Bacteria STAPHYLOCOCCUS HAEMOLYTICUS (A)    Organism ID, Bacteria ENTEROCOCCUS FAECALIS (A)       Susceptibility   Enterococcus faecalis - MIC*    AMPICILLIN <=2 SENSITIVE Sensitive     NITROFURANTOIN <=16 SENSITIVE Sensitive     VANCOMYCIN 1 SENSITIVE Sensitive     * 50,000 COLONIES/mL ENTEROCOCCUS FAECALIS   Staphylococcus haemolyticus - MIC*    CIPROFLOXACIN >=8 RESISTANT Resistant     GENTAMICIN <=0.5 SENSITIVE Sensitive     NITROFURANTOIN <=16 SENSITIVE Sensitive     OXACILLIN >=4 RESISTANT Resistant     TETRACYCLINE <=1 SENSITIVE Sensitive     VANCOMYCIN <=0.5 SENSITIVE Sensitive     TRIMETH/SULFA <=10 SENSITIVE Sensitive     CLINDAMYCIN >=8 RESISTANT Resistant     RIFAMPIN <=0.5 SENSITIVE Sensitive     Inducible Clindamycin NEGATIVE Sensitive     * 30,000 COLONIES/mL STAPHYLOCOCCUS HAEMOLYTICUS  Blood culture (routine single)   Specimen: BLOOD  Result Value Ref Range   Specimen  Description BLOOD RIGHT ARM    Special Requests      BOTTLES DRAWN AEROBIC AND ANAEROBIC Blood Culture adequate volume   Culture      NO GROWTH 5 DAYS Performed at [facility], [address] [place], [place] [address]    Report Status 01/12/2022 FINAL   Culture, blood (single)   Specimen: BLOOD  Result Value Ref Range   Specimen Description BLOOD RIGHT FA    Special Requests      BOTTLES DRAWN AEROBIC AND ANAEROBIC Blood Culture results may not be optimal due to an excessive volume of blood received in culture bottles   Culture      NO GROWTH 5 DAYS Performed at [facility], [address], [place], [place] [address]    Report Status 01/12/2022 FINAL   MRSA Next Gen by PCR, Nasal   Specimen: Nasal Mucosa; Nasal Swab  Result Value Ref Range   MRSA by PCR Next Gen NOT DETECTED NOT DETECTED  Basic metabolic panel  Result  Value Ref Range   Sodium 137 135 - 145 mmol/L   Potassium 3.5 3.5 - 5.1 mmol/L   Chloride 106 98 - 111 mmol/L   CO2 23 22 - 32 mmol/L   Glucose, Bld 174 (H) 70 - 99 mg/dL   BUN 25 (H) 8 - 23 mg/dL   Creatinine, Ser 1.65 (H) 0.61 - 1.24 mg/dL   Calcium 8.8 (L) 8.9 - 10.3 mg/dL   GFR, Estimated 46 (L) >60 mL/min   Anion gap 8 5 - 15  CBC  Result Value Ref Range   WBC 3.0 (L) 4.0 - 10.5 K/uL   RBC 3.11 (L) 4.22 - 5.81 MIL/uL   Hemoglobin 8.6 (L) 13.0 - 17.0 g/dL   HCT 27.6 (L) 39.0 - 52.0 %   MCV 88.7 80.0 - 100.0 fL   MCH 27.7 26.0 - 34.0 pg   MCHC 31.2 30.0 - 36.0 g/dL   RDW 17.2 (H) 11.5 - 15.5 %   Platelets 212 150 - 400 K/uL   nRBC 0.7 (H) 0.0 - 0.2 %  Hepatic function panel  Result Value Ref Range   Total Protein 7.1 6.5 - 8.1 g/dL   Albumin 3.4 (L) 3.5 - 5.0 g/dL   AST 41 15 - 41 U/L   ALT 77 (H) 0 - 44 U/L   Alkaline Phosphatase 140 (H) 38 - 126 U/L   Total Bilirubin 0.4 0.3 - 1.2 mg/dL   Bilirubin, Direct <0.1 0.0 - 0.2 mg/dL   Indirect Bilirubin NOT CALCULATED 0.3 - 0.9 mg/dL  Lactic acid, plasma  Result Value Ref Range   Lactic Acid, Venous 0.8 0.5 - 1.9 mmol/L  Protime-INR  Result Value Ref Range   Prothrombin Time 14.6 11.4 - 15.2 seconds   INR 1.2 0.8 - 1.2  APTT  Result Value Ref Range   aPTT 42 (H) 24 - 36 seconds  Lactic acid, plasma  Result Value Ref Range   Lactic Acid, Venous 1.4 0.5 - 1.9 mmol/L  Urinalysis, Complete w Microscopic  Result Value Ref Range   Color, Urine STRAW (A) YELLOW   APPearance HAZY (A) CLEAR   Specific Gravity, Urine 1.005 1.005 - 1.030   pH 5.0 5.0 - 8.0   Glucose, UA >=500 (A) NEGATIVE mg/dL   Hgb urine dipstick SMALL (A) NEGATIVE   Bilirubin Urine NEGATIVE NEGATIVE   Ketones, ur NEGATIVE NEGATIVE mg/dL   Protein, ur 100 (A) NEGATIVE mg/dL   Nitrite NEGATIVE NEGATIVE  Leukocytes,Ua NEGATIVE NEGATIVE   RBC / HPF 0-5 0 - 5 RBC/hpf   WBC, UA 0-5 0 - 5 WBC/hpf   Bacteria, UA RARE (A) NONE SEEN   Squamous Epithelial /  LPF 0-5 0 - 5   Mucus PRESENT   Thyroid Panel With TSH  Result Value Ref Range   TSH 1.870 0.450 - 4.500 uIU/mL   T4, Total 7.8 4.5 - 12.0 ug/dL   T3 Uptake Ratio 30 24 - 39 %   Free Thyroxine Index 2.3 1.2 - 4.9  Basic metabolic panel  Result Value Ref Range   Sodium 141 135 - 145 mmol/L   Potassium 3.9 3.5 - 5.1 mmol/L   Chloride 113 (H) 98 - 111 mmol/L   CO2 23 22 - 32 mmol/L   Glucose, Bld 97 70 - 99 mg/dL   BUN 25 (H) 8 - 23 mg/dL   Creatinine, Ser 1.57 (H) 0.61 - 1.24 mg/dL   Calcium 8.5 (L) 8.9 - 10.3 mg/dL   GFR, Estimated 49 (L) >60 mL/min   Anion gap 5 5 - 15  CBC  Result Value Ref Range   WBC 3.7 (L) 4.0 - 10.5 K/uL   RBC 2.87 (L) 4.22 - 5.81 MIL/uL   Hemoglobin 7.9 (L) 13.0 - 17.0 g/dL   HCT 25.3 (L) 39.0 - 52.0 %   MCV 88.2 80.0 - 100.0 fL   MCH 27.5 26.0 - 34.0 pg   MCHC 31.2 30.0 - 36.0 g/dL   RDW 17.1 (H) 11.5 - 15.5 %   Platelets 181 150 - 400 K/uL   nRBC 0.5 (H) 0.0 - 0.2 %  Lipid panel  Result Value Ref Range   Cholesterol 130 0 - 200 mg/dL   Triglycerides 181 (H) <150 mg/dL   HDL 59 >40 mg/dL   Total CHOL/HDL Ratio 2.2 RATIO   VLDL 36 0 - 40 mg/dL   LDL Cholesterol 35 0 - 99 mg/dL  Glucose, capillary  Result Value Ref Range   Glucose-Capillary 91 70 - 99 mg/dL  Glucose, capillary  Result Value Ref Range   Glucose-Capillary 81 70 - 99 mg/dL  Glucose, capillary  Result Value Ref Range   Glucose-Capillary 73 70 - 99 mg/dL  Glucose, capillary  Result Value Ref Range   Glucose-Capillary 84 70 - 99 mg/dL  Basic metabolic panel  Result Value Ref Range   Sodium 145 135 - 145 mmol/L   Potassium 3.6 3.5 - 5.1 mmol/L   Chloride 115 (H) 98 - 111 mmol/L   CO2 23 22 - 32 mmol/L   Glucose, Bld 70 70 - 99 mg/dL   BUN 21 8 - 23 mg/dL   Creatinine, Ser 1.83 (H) 0.61 - 1.24 mg/dL   Calcium 8.7 (L) 8.9 - 10.3 mg/dL   GFR, Estimated 41 (L) >60 mL/min   Anion gap 7 5 - 15  CBC  Result Value Ref Range   WBC 4.1 4.0 - 10.5 K/uL   RBC 3.18 (L) 4.22 - 5.81  MIL/uL   Hemoglobin 8.7 (L) 13.0 - 17.0 g/dL   HCT 28.3 (L) 39.0 - 52.0 %   MCV 89.0 80.0 - 100.0 fL   MCH 27.4 26.0 - 34.0 pg   MCHC 30.7 30.0 - 36.0 g/dL   RDW 17.6 (H) 11.5 - 15.5 %   Platelets 206 150 - 400 K/uL   nRBC 0.7 (H) 0.0 - 0.2 %  Magnesium  Result Value Ref Range   Magnesium 2.0 1.7 - 2.4 mg/dL  Glucose, capillary  Result Value Ref Range   Glucose-Capillary 161 (H) 70 - 99 mg/dL  Glucose, capillary  Result Value Ref Range   Glucose-Capillary 66 (L) 70 - 99 mg/dL  Glucose, capillary  Result Value Ref Range   Glucose-Capillary 71 70 - 99 mg/dL  APTT  Result Value Ref Range   aPTT >200 (HH) 24 - 36 seconds  Basic metabolic panel  Result Value Ref Range   Sodium 144 135 - 145 mmol/L   Potassium 3.6 3.5 - 5.1 mmol/L   Chloride 116 (H) 98 - 111 mmol/L   CO2 24 22 - 32 mmol/L   Glucose, Bld 126 (H) 70 - 99 mg/dL   BUN 22 8 - 23 mg/dL   Creatinine, Ser 1.81 (H) 0.61 - 1.24 mg/dL   Calcium 8.7 (L) 8.9 - 10.3 mg/dL   GFR, Estimated 41 (L) >60 mL/min   Anion gap 4 (L) 5 - 15  CBC  Result Value Ref Range   WBC 4.3 4.0 - 10.5 K/uL   RBC 2.96 (L) 4.22 - 5.81 MIL/uL   Hemoglobin 8.3 (L) 13.0 - 17.0 g/dL   HCT 26.6 (L) 39.0 - 52.0 %   MCV 89.9 80.0 - 100.0 fL   MCH 28.0 26.0 - 34.0 pg   MCHC 31.2 30.0 - 36.0 g/dL   RDW 17.8 (H) 11.5 - 15.5 %   Platelets 163 150 - 400 K/uL   nRBC 0.0 0.0 - 0.2 %  Magnesium  Result Value Ref Range   Magnesium 2.1 1.7 - 2.4 mg/dL  APTT  Result Value Ref Range   aPTT 189 (HH) 24 - 36 seconds  Heparin level (unfractionated)  Result Value Ref Range   Heparin Unfractionated >1.10 (H) 0.30 - 0.70 IU/mL  Vitamin B12  Result Value Ref Range   Vitamin B-12 396 180 - 914 pg/mL  Iron and TIBC  Result Value Ref Range   Iron 42 (L) 45 - 182 ug/dL   TIBC 291 250 - 450 ug/dL   Saturation Ratios 14 (L) 17.9 - 39.5 %   UIBC 249 ug/dL  Folate  Result Value Ref Range   Folate 14.1 >5.9 ng/mL  Procalcitonin - Baseline  Result Value Ref  Range   Procalcitonin 0.13 ng/mL  Heparin level (unfractionated)  Result Value Ref Range   Heparin Unfractionated >1.10 (H) 0.30 - 0.70 IU/mL  APTT  Result Value Ref Range   aPTT 106 (H) 24 - 36 seconds  Basic metabolic panel  Result Value Ref Range   Sodium 145 135 - 145 mmol/L   Potassium 3.5 3.5 - 5.1 mmol/L   Chloride 112 (H) 98 - 111 mmol/L   CO2 23 22 - 32 mmol/L   Glucose, Bld 77 70 - 99 mg/dL   BUN 19 8 - 23 mg/dL   Creatinine, Ser 1.66 (H) 0.61 - 1.24 mg/dL   Calcium 9.3 8.9 - 10.3 mg/dL   GFR, Estimated 46 (L) >60 mL/min   Anion gap 10 5 - 15  CBC  Result Value Ref Range   WBC 4.6 4.0 - 10.5 K/uL   RBC 3.42 (L) 4.22 - 5.81 MIL/uL   Hemoglobin 9.2 (L) 13.0 - 17.0 g/dL   HCT 30.5 (L) 39.0 - 52.0 %   MCV 89.2 80.0 - 100.0 fL   MCH 26.9 26.0 - 34.0 pg   MCHC 30.2 30.0 - 36.0 g/dL   RDW 17.7 (H) 11.5 - 15.5 %   Platelets 207 150 - 400 K/uL  nRBC 0.0 0.0 - 0.2 %  Magnesium  Result Value Ref Range   Magnesium 2.1 1.7 - 2.4 mg/dL  APTT  Result Value Ref Range   aPTT 76 (H) 24 - 36 seconds  Heparin level (unfractionated)  Result Value Ref Range   Heparin Unfractionated 0.86 (H) 0.30 - 0.70 IU/mL  Glucose, capillary  Result Value Ref Range   Glucose-Capillary 81 70 - 99 mg/dL  APTT  Result Value Ref Range   aPTT 64 (H) 24 - 36 seconds  CBG monitoring, ED  Result Value Ref Range   Glucose-Capillary 103 (H) 70 - 99 mg/dL  Troponin I (High Sensitivity)  Result Value Ref Range   Troponin I (High Sensitivity) 7 <18 ng/L  Troponin I (High Sensitivity)  Result Value Ref Range   Troponin I (High Sensitivity) 7 <18 ng/L  Troponin I (High Sensitivity)  Result Value Ref Range   Troponin I (High Sensitivity) 8 <18 ng/L        Current Outpatient Medications:  .  albuterol (VENTOLIN HFA) 108 (90 Base) MCG/ACT inhaler, Inhale 2 puffs into the lungs every 6 (six) hours as needed for wheezing or shortness of breath., Disp: 8 g, Rfl: 1 .  amLODipine (NORVASC) 10 MG  tablet, Take 1 tablet (10 mg total) by mouth daily., Disp: 90 tablet, Rfl: 1 .  apixaban (ELIQUIS) 5 MG TABS tablet, Take 1 tablet (5 mg total) by mouth 2 (two) times daily., Disp: 60 tablet, Rfl: 0 .  atorvastatin (LIPITOR) 80 MG tablet, Take 1 tablet (80 mg total) by mouth every evening. Home med., Disp: , Rfl:  .  Blood Glucose Monitoring Suppl KIT, Accucheck Guide with lancets and test strips #100 with one refill.  Test blood sugar twice daily. DX Code E11.65 ; Z79.4, Disp: 1 kit, Rfl: 0 .  Continuous Blood Gluc Sensor (FREESTYLE LIBRE 2 SENSOR) MISC, 1 each by Does not apply route every 14 (fourteen) days., Disp: 6 each, Rfl: 1 .  dapagliflozin propanediol (FARXIGA) 10 MG TABS tablet, Take 1 tablet (10 mg total) by mouth daily before breakfast. In place of glipizide, Disp: 30 tablet, Rfl: 0 .  Incontinence Supply Disposable (COMFORT PROTECT ADULT DIAPER/L) MISC, 1 Units by Does not apply route daily. Use 3-4 a day as needed, Disp: 90 each, Rfl: 3 .  Insulin Pen Needle 32G X 6 MM MISC, Daily, Disp: 100 each, Rfl: 3 .  iron polysaccharides (NIFEREX) 150 MG capsule, Take 1 capsule (150 mg total) by mouth daily. Can take any form of over-the-counter iron supplement., Disp: , Rfl:  .  metoCLOPramide (REGLAN) 5 MG tablet, TAKE 1 TABLET BY MOUTH 3 TIMES DAILY BEFORE MEALS, Disp: 90 tablet, Rfl: 0 .  mirtazapine (REMERON) 7.5 MG tablet, Take 1 tablet (7.5 mg total) by mouth at bedtime., Disp: 30 tablet, Rfl: 3 .  Misc. Devices MISC, One pair of Compression stockings  Hemiplegia and hemiparesis following cerebral infarction affecting left non-dominant side (HCC)  - Primary Codes: L89.373 Lower extremity edema  Codes: R60.0 Type 2 diabetes mellitus with hyperglycemia, with long-term current use of insulin (HCC)  Codes: E11.65, Z79.4, Disp: 1 Units, Rfl: 0 .  nitroGLYCERIN (NITROSTAT) 0.4 MG SL tablet, Place 1 tablet (0.4 mg total) under the tongue every 5 (five) minutes as needed for chest pain., Disp: 30  tablet, Rfl: 0 .  omeprazole (PRILOSEC) 40 MG capsule, Take 1 capsule (40 mg total) by mouth in the morning and at bedtime. Home med., Disp: , Rfl:  .  tamsulosin (FLOMAX) 0.4 MG CAPS capsule, Take 1 capsule (0.4 mg total) by mouth daily., Disp: 30 capsule, Rfl: 0 .  ACCU-CHEK GUIDE test strip, Check fsbs once daily, Disp: 100 each, Rfl: 2 .  Accu-Chek Softclix Lancets lancets, SMARTSIG:Topical, Disp: 100 each, Rfl: 1    Assessment & Plan:   Problem List Items Addressed This Visit   None    No orders of the defined types were placed in this encounter.    No orders of the defined types were placed in this encounter.    Follow up plan: No follow-ups on file.  Health Maintenance :***  Mammogram/ Paps smear: DEXA: PSA :  Cscope : Pneumonia vaccine : prenvanar / pneumovax  FLu vaccine :  Labs next visit : CBC, CMP, FLP, HBA1C, TSH, PSA, urine microalbumin Labs 1 week prior to next visit.

## 2022-01-14 ENCOUNTER — Ambulatory Visit
Admission: RE | Admit: 2022-01-14 | Discharge: 2022-01-14 | Disposition: A | Discharge status: Home / Self Care | Payer: Medicaid Other | Source: Ambulatory Visit | Attending: Internal Medicine | Admitting: Internal Medicine

## 2022-01-14 ENCOUNTER — Ambulatory Visit
Admission: RE | Admit: 2022-01-14 | Discharge: 2022-01-14 | Disposition: A | Discharge status: Home / Self Care | Payer: Medicaid Other | Attending: Internal Medicine | Admitting: Internal Medicine

## 2022-01-14 ENCOUNTER — Other Ambulatory Visit: Payer: Self-pay

## 2022-01-14 ENCOUNTER — Ambulatory Visit: Payer: Self-pay | Admitting: *Deleted

## 2022-01-14 ENCOUNTER — Ambulatory Visit (INDEPENDENT_AMBULATORY_CARE_PROVIDER_SITE_OTHER): Payer: Medicaid Other | Admitting: Urology

## 2022-01-14 DIAGNOSIS — R339 Retention of urine, unspecified: Secondary | ICD-10-CM

## 2022-01-14 DIAGNOSIS — J189 Pneumonia, unspecified organism: Secondary | ICD-10-CM | POA: Insufficient documentation

## 2022-01-14 DIAGNOSIS — Z466 Encounter for fitting and adjustment of urinary device: Secondary | ICD-10-CM

## 2022-01-14 DIAGNOSIS — N179 Acute kidney failure, unspecified: Secondary | ICD-10-CM

## 2022-01-14 DIAGNOSIS — I639 Cerebral infarction, unspecified: Secondary | ICD-10-CM

## 2022-01-14 DIAGNOSIS — N3942 Incontinence without sensory awareness: Secondary | ICD-10-CM

## 2022-01-14 MED ORDER — COMFORT PROTECT ADULT DIAPER/L MISC: 1.0000 [IU] | Freq: Every day | 3 refills | Status: DC

## 2022-01-14 MED ORDER — NITROFURANTOIN MONOHYD MACRO 100 MG PO CAPS: 100.0000 mg | ORAL_CAPSULE | Freq: Two times a day (BID) | ORAL | 0 refills | Status: DC

## 2022-01-14 NOTE — Progress Notes (Signed)
Catheter Removal  Patient is present today for a catheter removal. 7ml of water was drained from the balloon. A 14FR foley cath was removed from the bladder no complications were noted. Patient tolerated well.  Performed by: Debbe Bales, CMA   Follow up/ Additional notes: RTC in 6 wks for PVR, pt given strict return precaution for urinary retention

## 2022-01-14 NOTE — Telephone Encounter (Signed)
Transition Care Management Unsuccessful Follow-up Telephone Call  Date of discharge and from where:  01/11/2022 from [facility]  Attempts:  2nd Attempt  Reason for unsuccessful TCM follow-up call:  Voice mail full

## 2022-01-14 NOTE — Progress Notes (Signed)
   01/14/2022 12:52 PM   Evan Cones Sr. 12-07-1955 [id number]  Reason for visit: Follow up urinary retention/meatal stenosis  HPI: 66 year old comorbid male recently admitted for sepsis secondary to pneumonia and had urinary retention with nursing unable to place Foley.  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 Felipa Eth ultimately performed urethral dilation and placed a Foley catheter on 01/11/2022.  Urine has been clear yellow.  He denied urinary symptoms leading up to the catheter placement during this recent hospitalization.  I actually saw him in February 2023 after he had been admitted for chest pain and ACS rule out and he had a distended bladder and a Foley was reportedly placed for elevated PVRs.  PSA was normal at 2.3 in July 2022, and prostate measured 54 g on CT scan.  He denied significant urinary symptoms prior to that hospitalization.  At that time his Foley was removed and had been able to void without issue and normal PVR of 94 mL, and he was continued on Flomax at that time.  He never followed up.  Foley was removed today, and based on his positive urine cultures nitrofurantoin 100 mg twice daily x7 days was prescribed.  I recommended returning this afternoon for PVR, but they are not sure they can come back this afternoon.  Return precautions discussed extensively including inability to urinate that would warrant return to clinic this afternoon for Foley catheter replacement.  Continue Flomax Nitrofurantoin 100 mg twice daily x7 days RTC 4 to 6 weeks PVR and symptom check Consider cystoscopy in the future if persistent urinary symptoms or recurrent retention  Billey Co, MD  [place] [address] [place], [place] [address] [phone]

## 2022-01-14 NOTE — Telephone Encounter (Signed)
Please call [name] with [employer] at [phone].  There until 6:00 today.   Needing information regarding the Farxiga and Baclofen.

## 2022-01-14 NOTE — Telephone Encounter (Signed)
Dr. Charlotta Newton, Referral for home health to get all DME's patients wife is asking for.

## 2022-01-14 NOTE — Telephone Encounter (Signed)
Evan Townsend from the [employer] was called back. Informed Evan Townsend that patient is not on baclofen and is still currently on Farxiga. Drew verbalized understanding.

## 2022-01-14 NOTE — Telephone Encounter (Signed)
Reason for Disposition  [1] Caller has URGENT medicine question about med that PCP or specialist prescribed AND [2] triager unable to answer question    [name] with [name] [phone]  Answer Assessment - Initial Assessment Questions 1. NAME of MEDICATION: "What medicine are you calling about?"     [name] with [name] 2. QUESTION: "What is your question?" (e.g., double dose of medicine, side effect)     Farxiga and Baclofen stopped per family.  Pt D/C'd this morning from hospital.   D/C papers showing Farxiga but not Baclofen. Family said he is not on either one.    Needing clarification on Farxiga and Baclofen if he is supposed to be taking them or not and the dosages and how often. 3. PRESCRIBING HCP: "Who prescribed it?" Reason: if prescribed by specialist, call should be referred to that group.     Dr. Loman Brooklyn Vigg 4. SYMPTOMS: "Do you have any symptoms?"     N/A 5. SEVERITY: If symptoms are present, ask "Are they mild, moderate or severe?"     N/A 6. PREGNANCY:  "Is there any chance that you are pregnant?" "When was your last menstrual period?"     N/A  Protocols used: Medication Question Call-A-AH

## 2022-01-15 ENCOUNTER — Telehealth: Payer: Self-pay

## 2022-01-15 ENCOUNTER — Encounter: Payer: Medicaid Other | Admitting: Physical Therapy

## 2022-01-15 NOTE — Telephone Encounter (Signed)
Patient has completed follow up visit.

## 2022-01-15 NOTE — Telephone Encounter (Signed)
Received incoming message from Leward Quan, CMA with [facility] practice on behalf of Dr. Charlotta Newton. They are requesting that we take over management of patient's incontinence supplies, specifically adult diapers. Kathlene Cote that we are happy to order supplies for patient. Online request form submitted to [employer] [facility]. Awaiting response.

## 2022-01-15 NOTE — Telephone Encounter (Signed)
Error

## 2022-01-16 ENCOUNTER — Other Ambulatory Visit: Payer: Self-pay | Admitting: Internal Medicine

## 2022-01-16 NOTE — Telephone Encounter (Signed)
Requested medication (s) are due for refill today:   Not sure  Requested medication (s) are on the active medication list:   No  Future visit scheduled:   Yes   Last ordered: Not on list  Returned because this is not on his medication list.   Requested Prescriptions  Pending Prescriptions Disp Refills   melatonin 1 MG TABS tablet [Pharmacy Med Name: MELATONIN 1 MG TAB] 30 tablet     Sig: TAKE 1 TABLET BY MOUTH AT BEDTIME     Over the Counter:  OTC Passed - 01/16/2022 11:58 AM      Passed - Valid encounter within last 12 months    Recent Outpatient Visits           3 days ago Pneumonia due to infectious organism, unspecified laterality, unspecified part of lung   [facility] Vigg, Avanti, MD   1 week ago Essential hypertension   [facility] Vigg, Avanti, MD   2 weeks ago Acute renal failure, unspecified acute renal failure type (HCC)   [facility] Vigg, Avanti, MD   4 weeks ago Elevated LFTs   [facility] Vigg, Avanti, MD   2 months ago Essential hypertension   [facility] Vigg, Chief Operating Officer, MD       Future Appointments             In 1 week Lanier Prude, MD [facility], [facility]   In 1 month Furth, Cadence H, PA-C [facility], [facility]   In 1 month Gabriel Cirri, NP [facility], [facility]

## 2022-01-19 ENCOUNTER — Encounter (INDEPENDENT_AMBULATORY_CARE_PROVIDER_SITE_OTHER): Payer: Medicaid Other | Admitting: Vascular Surgery

## 2022-01-19 NOTE — Telephone Encounter (Signed)
Please advise, medication not on current list.

## 2022-01-20 ENCOUNTER — Encounter: Payer: Medicaid Other | Admitting: Physical Therapy

## 2022-01-22 ENCOUNTER — Encounter: Payer: Medicaid Other | Admitting: Physical Therapy

## 2022-01-27 ENCOUNTER — Ambulatory Visit: Payer: Medicaid Other | Admitting: Urology

## 2022-01-28 ENCOUNTER — Ambulatory Visit (INDEPENDENT_AMBULATORY_CARE_PROVIDER_SITE_OTHER): Payer: Medicaid Other | Admitting: Cardiology

## 2022-01-28 ENCOUNTER — Other Ambulatory Visit: Payer: Self-pay | Admitting: Internal Medicine

## 2022-01-28 ENCOUNTER — Encounter: Payer: Self-pay | Admitting: Cardiology

## 2022-01-28 ENCOUNTER — Ambulatory Visit (INDEPENDENT_AMBULATORY_CARE_PROVIDER_SITE_OTHER): Payer: Medicaid Other

## 2022-01-28 VITALS — BP 146/76 | HR 61 | Ht 65.98 in | Wt 140.8 lb

## 2022-01-28 DIAGNOSIS — R001 Bradycardia, unspecified: Secondary | ICD-10-CM

## 2022-01-28 DIAGNOSIS — I480 Paroxysmal atrial fibrillation: Secondary | ICD-10-CM

## 2022-01-28 DIAGNOSIS — E1143 Type 2 diabetes mellitus with diabetic autonomic (poly)neuropathy: Secondary | ICD-10-CM

## 2022-01-28 DIAGNOSIS — I639 Cerebral infarction, unspecified: Secondary | ICD-10-CM | POA: Diagnosis not present

## 2022-01-28 NOTE — Telephone Encounter (Signed)
Requested medications are due for refill today.  yes  Requested medications are on the active medications list.  yes  Last refill. 12/25/2021 #90 0 refills  Future visit scheduled.   yes  Notes to clinic.  Medication refill is not delegated.    Requested Prescriptions  Pending Prescriptions Disp Refills   metoCLOPramide (REGLAN) 5 MG tablet [Pharmacy Med Name: METOCLOPRAMIDE HCL 5 MG TAB] 90 tablet 0    Sig: TAKE 1 TABLET BY MOUTH 3 TIMES DAILY BEFORE MEALS     Not Delegated - Gastroenterology: Antiemetics - metoclopramide Failed - 01/28/2022  3:23 PM      Failed - This refill cannot be delegated      Failed - Cr in normal range and within 360 days    Creat  Date Value Ref Range Status  05/16/2020 1.38 (H) 0.70 - 1.25 mg/dL Final    Comment:    For patients >60 years of age, the reference limit for Creatinine is approximately 13% higher for people identified as African-American. .    Creatinine, Ser  Date Value Ref Range Status  01/10/2022 1.66 (H) 0.61 - 1.24 mg/dL Final   Creatinine, Urine  Date Value Ref Range Status  01/31/2019 96 20 - 320 mg/dL Final         Passed - Valid encounter within last 6 months    Recent Outpatient Visits           2 weeks ago Pneumonia due to infectious organism, unspecified laterality, unspecified part of lung   [facility] Vigg, Avanti, MD   3 weeks ago Essential hypertension   [facility] Vigg, Avanti, MD   3 weeks ago Acute renal failure, unspecified acute renal failure type (HCC)   [facility] Vigg, Avanti, MD   1 month ago Elevated LFTs   [facility] Vigg, Avanti, MD   2 months ago Essential hypertension   [facility] Vigg, Chief Operating Officer, MD       Future Appointments             In 3 weeks Furth, Cadence H, PA-C [facility], [facility]   In 1 month Gabriel Cirri, NP [employer] [facility], [facility]   In 6 months Lalla Brothers, Rossie Muskrat, MD [facility], [facility]

## 2022-01-28 NOTE — Progress Notes (Signed)
Electrophysiology Office Follow up Visit Note:    Date:  01/28/2022   ID:  Evan Cones Sr., DOB Mar 20, 1956, MRN [id number]  PCP:  Charlynne Cousins, MD  [facility] Cardiologist:  Nelva Bush, MD  [facility] Electrophysiologist:  Vickie Epley, MD    Interval History:    Evan GERVASE Sr. is a 66 y.o. male who presents for a follow up visit.  I saw the patient when he was hospitalized in May 2023.  I consulted Jan 07, 2022 for bradycardia.  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  Chest x-ray was concerning for pneumonia and he was started on IV antibiotics.  A cardiac MRI and left heart catheterization were performed. His heart rates improved while hospitalized and he had appropriate chronotropic response.  He presents today for follow-up.  Today he tells me he has been doing better since leaving the hospital.  Still feels weak throughout the day but this is improving slowly.     Past Medical History:  Diagnosis Date   Carotid arterial disease ([place])    a. 08/2018 Carotid U/S: min-mod RICA atherosclerosis w/o hemodynamically significant stenosis. Nl LICA.   Diabetes 1.5, managed as type 2 ([place])    Diastolic dysfunction    a. 08/2018 Echo: EF 65%. No rwma. Gr1 DD. Mild MR.   Diastolic dysfunction    a. 08/2019 Echo: EF 55-60%, Gr1 DD. No rwma. Mild MR. RVSP 37.56mHg.   Hypercholesterolemia    Hypertension    PAF (paroxysmal atrial fibrillation) ([place]    a. 10/2019 Event Monitor: PAF; b. CHA2DS2VASc = 5-->Eliquis.   Poorly controlled diabetes mellitus ([place]    a. 04/2019 A1c 13.8.   Recurrent strokes ([place]    a. 10/2016 MRI/A: Acute 513mR thalamic infarct, ? subacute infarct of R corona radiata; b. 08/2017 MRI/A: Acute 26m75materal L thalamic infarct. Other more remote lacunar infarcts of thalami bilat. Small vessel dzs; c. 08/2018 MRI/A: Acute lacunar infarct of the post limb of R internal capsule; d. 08/2019 MRI Acute CVA  of L paramedian pons adn R cerebellar hemisphere.   Tobacco abuse     Past Surgical History:  Procedure Laterality Date   ESOPHAGOGASTRODUODENOSCOPY (EGD) WITH PROPOFOL N/A 04/26/2019   Procedure: ESOPHAGOGASTRODUODENOSCOPY (EGD) WITH PROPOFOL;  Surgeon: VanLin LandsmanD;  Location: [facility] ENDOSCOPY;  Service: Gastroenterology;  Laterality: N/A;   LEFT HEART CATH AND CORONARY ANGIOGRAPHY N/A 01/09/2022   Procedure: LEFT HEART CATH AND CORONARY ANGIOGRAPHY;  Surgeon: EndNelva BushD;  [facility];  Service: Cardiovascular;  Laterality: N/A;   NO PAST SURGERIES      Current Medications: Current Meds  Medication Sig   ACCU-CHEK GUIDE test strip Check fsbs once daily   Accu-Chek Softclix Lancets lancets SMARTSIG:Topical   albuterol (VENTOLIN HFA) 108 (90 Base) MCG/ACT inhaler Inhale 2 puffs into the lungs every 6 (six) hours as needed for wheezing or shortness of breath.   amLODipine (NORVASC) 10 MG tablet Take 1 tablet (10 mg total) by mouth daily.   apixaban (ELIQUIS) 5 MG TABS tablet Take 1 tablet (5 mg total) by mouth 2 (two) times daily.   atorvastatin (LIPITOR) 80 MG tablet Take 1 tablet (80 mg total) by mouth every evening. Home med.   Blood Glucose Monitoring Suppl KIT Accucheck Guide with lancets and test strips #100 with one refill.  Test blood sugar twice daily. DX Code E11.65 ; Z79.4   Continuous Blood Gluc  Sensor (FREESTYLE LIBRE 2 SENSOR) MISC 1 each by Does not apply route every 14 (fourteen) days.   dapagliflozin propanediol (FARXIGA) 10 MG TABS tablet Take 1 tablet (10 mg total) by mouth daily before breakfast. In place of glipizide   Incontinence Supply Disposable (COMFORT PROTECT ADULT DIAPER/L) MISC 1 Units by Does not apply route daily. Use 3-4 a day as needed   Insulin Pen Needle 32G X 6 MM MISC Daily   iron polysaccharides (NIFEREX) 150 MG capsule Take 1 capsule (150 mg total) by mouth daily. Can take any form of over-the-counter iron  supplement.   melatonin 1 MG TABS tablet TAKE 1 TABLET BY MOUTH AT BEDTIME   metoCLOPramide (REGLAN) 5 MG tablet TAKE 1 TABLET BY MOUTH 3 TIMES DAILY BEFORE MEALS   mirtazapine (REMERON) 7.5 MG tablet Take 1 tablet (7.5 mg total) by mouth at bedtime.   Misc. Devices MISC One pair of Compression stockings  Hemiplegia and hemiparesis following cerebral infarction affecting left non-dominant side (HCC)  - Primary Codes: O87.867 Lower extremity edema  Codes: R60.0 Type 2 diabetes mellitus with hyperglycemia, with long-term current use of insulin (HCC)  Codes: E11.65, Z79.4   nitrofurantoin, macrocrystal-monohydrate, (MACROBID) 100 MG capsule Take 1 capsule (100 mg total) by mouth 2 (two) times daily.   nitroGLYCERIN (NITROSTAT) 0.4 MG SL tablet Place 1 tablet (0.4 mg total) under the tongue every 5 (five) minutes as needed for chest pain.   omeprazole (PRILOSEC) 40 MG capsule Take 1 capsule (40 mg total) by mouth in the morning and at bedtime. Home med.   tamsulosin (FLOMAX) 0.4 MG CAPS capsule Take 1 capsule (0.4 mg total) by mouth daily.     Allergies:   Patient has no known allergies.   Social History   Socioeconomic History   Marital status: Married    Spouse name: Presenter, broadcasting   Number of children: 5   Years of education: Not on file   Highest education level: Not on file  Occupational History   Occupation: unemployed  Tobacco Use   Smoking status: Former    Packs/day: 1.00    Years: 38.00    Total pack years: 38.00    Types: Cigarettes    Start date: 1982   Smokeless tobacco: Never  Vaping Use   Vaping Use: Never used  Substance and Sexual Activity   Alcohol use: Not Currently    Alcohol/week: 1.0 standard drink of alcohol    Types: 1 Cans of beer per week    Comment: previously drank but nothing in 1-2 yrs (08/2019).   Drug use: Not Currently    Types: Cocaine, Marijuana    Comment: prev used cocaine/marijuana but none x 1-2 yrs (08/2019).   Sexual activity: Yes   Other Topics Concern   Not on file  Social History Narrative   Lives in [place] with his wife.  Uses a cane when ambulating.  Can walk about 25 yds prior to having to rest - says he stumbles if he has to work further than that.  Usually uses a scooter to get around stores.   Social Determinants of Health   Financial Resource Strain: High Risk (01/13/2018)   Overall Financial Resource Strain (CARDIA)    Difficulty of Paying Living Expenses: Very hard  Food Insecurity: No Food Insecurity (01/13/2018)   Hunger Vital Sign    Worried About Running Out of Food in the Last Year: Never true    Ran Out of Food in the Last Year: Never true  Transportation Needs: No Transportation Needs (12/06/2018)   PRAPARE - Hydrologist (Medical): No    Lack of Transportation (Non-Medical): No  Physical Activity: Unknown (11/25/2018)   Exercise Vital Sign    Days of Exercise per Week: 0 days    Minutes of Exercise per Session: Not asked  Stress: No Stress Concern Present (11/25/2018)   [employer]    Feeling of Stress : Only a little  Social Connections: Moderately Integrated (01/13/2018)   Social Connection and Isolation Panel [NHANES]    Frequency of Communication with Friends and Family: More than three times a week    Frequency of Social Gatherings with Friends and Family: Once a week    Attends Religious Services: More than 4 times per year    Active Member of [employer] or Organizations: No    Attends Music therapist: Never    Marital Status: Married     Family History: The patient's family history includes Diabetes in his father; Heart attack in his father; Hypertension in his father and mother; Stroke in his mother.  ROS:   Please see the history of present illness.    All other systems reviewed and are negative.  EKGs/Labs/Other Studies Reviewed:    The following studies were reviewed  today:  Jan 07, 2022 cardiac MRI Normal LV size and function No LGE Normal RV size and function   Jan 09, 2022 left heart catheterization Nonobstructive coronary artery disease Normal LVEDP  November 14, 2021 ZIO monitor Heart rate 27-1 26, average 56 Rare PACs and PVCs Mobitz 1 and 2-1 AV block were present No prolonged pauses     EKG:  The ekg ordered today demonstrates right bundle branch block, left anterior fascicular block, sinus rhythm  Recent Labs: 01/06/2022: ALT 77 01/07/2022: TSH 1.870 01/10/2022: BUN 19; Creatinine, Ser 1.66; Hemoglobin 9.2; Magnesium 2.1; Platelets 207; Potassium 3.5; Sodium 145  Recent Lipid Panel    Component Value Date/Time   CHOL 130 01/07/2022 0321   CHOL 134 11/12/2021 1532   TRIG 181 (H) 01/07/2022 0321   HDL 59 01/07/2022 0321   HDL 53 11/12/2021 1532   CHOLHDL 2.2 01/07/2022 0321   VLDL 36 01/07/2022 0321   LDLCALC 35 01/07/2022 0321   LDLCALC 52 11/12/2021 1532   LDLCALC 65 02/13/2020 1417    Physical Exam:    VS:  BP (!) 146/76   Pulse 61   Ht 5' 5.98" (1.676 m)   Wt 140 lb 12.8 oz (63.9 kg)   SpO2 98%   BMI 22.74 kg/m     Wt Readings from Last 3 Encounters:  01/28/22 140 lb 12.8 oz (63.9 kg)  01/13/22 148 lb (67.1 kg)  01/07/22 148 lb 13 oz (67.5 kg)     GEN:  Well nourished, well developed in no acute distress HEENT: Normal NECK: No JVD; No carotid bruits LYMPHATICS: No lymphadenopathy CARDIAC: RRR, no murmurs, rubs, gallops RESPIRATORY:  Clear to auscultation without rales, wheezing or rhonchi  ABDOMEN: Soft, non-tender, non-distended MUSCULOSKELETAL:  No edema; No deformity  SKIN: Warm and dry NEUROLOGIC:  Alert and oriented x 3 PSYCHIATRIC:  Normal affect        ASSESSMENT:    1. Bradycardia    PLAN:    In order of problems listed above:  #Bradycardia #Right bundle branch block, left anterior fascicular block Patient's heart rates have improved since leaving the hospital.  I would like to  confirm his chronotropic competence using a 2-week ZIO monitor.  #Recurrent strokes On Eliquis for stroke prophylaxis, secondary  #Paroxysmal atrial fibrillation On Eliquis for secondary stroke prophylaxis   Follow-up with APP in 6 months or sooner based on the results of the heart monitor     Medication Adjustments/Labs and Tests Ordered: Current medicines are reviewed at length with the patient today.  Concerns regarding medicines are outlined above.  Orders Placed This Encounter  Procedures   LONG TERM MONITOR (3-14 DAYS)   EKG 12-Lead   No orders of the defined types were placed in this encounter.    Signed, Lars Mage, MD, [facility], [facility] 01/28/2022 1:00 PM    Electrophysiology [facility]

## 2022-01-28 NOTE — Patient Instructions (Signed)
Medications: Your physician recommends that you continue on your current medications as directed. Please refer to the Current Medication list given to you today. *If you need a refill on your cardiac medications before your next appointment, please call your pharmacy*  Lab Work: None. If you have labs (blood work) drawn today and your tests are completely normal, you will receive your results only by: MyChart Message (if you have MyChart) OR A paper copy in the mail If you have any lab test that is abnormal or we need to change your treatment, we will call you to review the results.  Testing/Procedures: Your physician has recommended that you wear a heart monitor. Heart monitors are medical devices that record the heart's electrical activity. Doctors most often use these monitors to diagnose arrhythmias. Arrhythmias are problems with the speed or rhythm of the heartbeat. The monitor is a small, portable device. You can wear one while you do your normal daily activities. This is usually used to diagnose what is causing palpitations/syncope (passing out).   Follow-Up: At [facility], you and your health needs are our priority.  As part of our continuing mission to provide you with exceptional heart care, we have created designated Provider Care Teams.  These Care Teams include your primary Cardiologist (physician) and Advanced Practice Providers (APPs -  Physician Assistants and Nurse Practitioners) who all work together to provide you with the care you need, when you need it.  Your physician wants you to follow-up in: 6 months with Steffanie Dunn, MD  We recommend signing up for the patient portal called "MyChart".  Sign up information is provided on this After Visit Summary.  MyChart is used to connect with patients for Virtual Visits (Telemedicine).  Patients are able to view lab/test results, encounter notes, upcoming appointments, etc.  Non-urgent messages can be sent to your provider as  well.   To learn more about what you can do with MyChart, go to [email or web address].    Any Other Special Instructions Will Be Listed Below (If Applicable).  ZIO XT- Long Term Monitor Instructions  Your physician has requested you wear a ZIO patch monitor for 14 days.  This is a single patch monitor. [employer] supplies one patch monitor per enrollment. Additional stickers are not available. Please do not apply patch if you will be having a Nuclear Stress Test,  Echocardiogram, Cardiac CT, MRI, or Chest Xray during the period you would be wearing the  monitor. The patch cannot be worn during these tests. You cannot remove and re-apply the  ZIO XT patch monitor.  Your ZIO patch monitor will be mailed 3 day USPS to your address on file. It may take 3-5 days  to receive your monitor after you have been enrolled.  Once you have received your monitor, please review the enclosed instructions. Your monitor  has already been registered assigning a specific monitor serial # to you.  Billing and Patient Assistance Program Information  We have supplied [employer] with any of your insurance information on file for billing purposes. [employer] offers a sliding scale Patient Assistance Program for patients that do not have  insurance, or whose insurance does not completely cover the cost of the ZIO monitor.  You must apply for the Patient Assistance Program to qualify for this discounted rate.  To apply, please call [employer] at [phone], select option 4, select option 2, ask to apply for  Patient Assistance Program. Meredeth Ide will ask your household income, and how many people  are in  your household. They will quote your out-of-pocket cost based on that information.  [employer] will also be able to set up a 79-month, interest-free payment plan if needed.  Applying the monitor   Shave hair from upper left chest.  Hold abrader disc by orange tab. Rub abrader in 40 strokes over the upper left chest as   indicated in your monitor instructions.  Clean area with 4 enclosed alcohol pads. Let dry.  Apply patch as indicated in monitor instructions. Patch will be placed under collarbone on left  side of chest with arrow pointing upward.  Rub patch adhesive wings for 2 minutes. Remove white label marked "1". Remove the white  label marked "2". Rub patch adhesive wings for 2 additional minutes.  While looking in a mirror, press and release button in center of patch. A small green light will  flash 3-4 times. This will be your only indicator that the monitor has been turned on.  Do not shower for the first 24 hours. You may shower after the first 24 hours.  Press the button if you feel a symptom. You will hear a small click. Record Date, Time and  Symptom in the Patient Logbook.  When you are ready to remove the patch, follow instructions on the last 2 pages of Patient  Logbook. Stick patch monitor onto the last page of Patient Logbook.  Place Patient Logbook in the blue and white box. Use locking tab on box and tape box closed  securely. The blue and white box has prepaid postage on it. Please place it in the mailbox as  soon as possible. Your physician should have your test results approximately 7 days after the  monitor has been mailed back to [facility].  Call [facility] Customer Care at [phone] if you have questions regarding  your ZIO XT patch monitor. Call them immediately if you see an orange light blinking on your  monitor.  If your monitor falls off in less than 4 days, contact our Monitor department at [phone].  If your monitor becomes loose or falls off after 4 days call [employer] at [phone] for  suggestions on securing your monitor

## 2022-01-29 ENCOUNTER — Other Ambulatory Visit: Payer: Self-pay | Admitting: Internal Medicine

## 2022-01-29 DIAGNOSIS — E1143 Type 2 diabetes mellitus with diabetic autonomic (poly)neuropathy: Secondary | ICD-10-CM

## 2022-01-29 NOTE — Telephone Encounter (Signed)
Requested medication (s) are due for refill today: yes  Requested medication (s) are on the active medication list: yes  Last refill:  12/25/21 #90 with 0 RF  Future visit scheduled: 03/04/22  Notes to clinic:  This med was refused recently due to "asking for refill too early" the pt only got 90 and takes 3 x a day So he is out of medication. This med is not delegated, Please assess.     Requested Prescriptions  Pending Prescriptions Disp Refills   metoCLOPramide (REGLAN) 5 MG tablet [Pharmacy Med Name: METOCLOPRAMIDE HCL 5 MG TAB] 90 tablet 0    Sig: TAKE 1 TABLET BY MOUTH 3 TIMES DAILY BEFORE MEALS     Not Delegated - Gastroenterology: Antiemetics - metoclopramide Failed - 01/29/2022 10:14 AM      Failed - This refill cannot be delegated      Failed - Cr in normal range and within 360 days    Creat  Date Value Ref Range Status  05/16/2020 1.38 (H) 0.70 - 1.25 mg/dL Final    Comment:    For patients >48 years of age, the reference limit for Creatinine is approximately 13% higher for people identified as African-American. .    Creatinine, Ser  Date Value Ref Range Status  01/10/2022 1.66 (H) 0.61 - 1.24 mg/dL Final   Creatinine, Urine  Date Value Ref Range Status  01/31/2019 96 20 - 320 mg/dL Final         Passed - Valid encounter within last 6 months    Recent Outpatient Visits           2 weeks ago Pneumonia due to infectious organism, unspecified laterality, unspecified part of lung   [facility] Vigg, Avanti, MD   3 weeks ago Essential hypertension   [facility] Vigg, Avanti, MD   4 weeks ago Acute renal failure, unspecified acute renal failure type (HCC)   [facility] Vigg, Avanti, MD   1 month ago Elevated LFTs   [facility] Vigg, Avanti, MD   2 months ago Essential hypertension   [facility] Vigg, Chief Operating Officer, MD       Future Appointments             In 3 weeks Furth, Cadence H, PA-C [facility], [facility]   In 1 month Gabriel Cirri, NP [facility], [facility]   In 6 months Lalla Brothers, Rossie Muskrat, MD [facility], [facility]

## 2022-02-02 ENCOUNTER — Other Ambulatory Visit: Payer: Self-pay | Admitting: *Deleted

## 2022-02-02 DIAGNOSIS — R001 Bradycardia, unspecified: Secondary | ICD-10-CM

## 2022-02-02 NOTE — Patient Outreach (Signed)
Care Coordination  02/02/2022  Evan PAYETTE Sr. February 18, 1956 [id number]   Medicaid Managed Care   Unsuccessful Outreach Note  02/02/2022 Name: Evan MONICAL Sr. MRN: [id number] DOB: [birth year]  Referred by: Loura Pardon, MD Reason for referral : High Risk Managed Medicaid (Unsuccessful RNCM outreach)   An unsuccessful telephone outreach was attempted today. The patient was referred to the case management team for assistance with care management and care coordination.   Follow Up Plan: A HIPAA compliant phone message was left for the patient providing contact information and requesting a return call.   Estanislado Emms RN, BSN [place]  [facility]

## 2022-02-02 NOTE — Patient Instructions (Signed)
Visit Information  [name]  - as a part of your Medicaid benefit, you are eligible for care management and care coordination services at no cost or copay. I was unable to reach you by phone today but would be happy to help you with your health related needs. Please feel free to call me @ [phone].   A member of the Managed Medicaid care management team will reach out to you again over the next 14 days.   Estanislado Emms RN, BSN [place]  [facility]

## 2022-02-03 ENCOUNTER — Other Ambulatory Visit: Payer: Self-pay | Admitting: *Deleted

## 2022-02-03 NOTE — Patient Instructions (Signed)
Visit Information  Mr. Evan E Michalski Sr.  - as a part of your Medicaid benefit, you are eligible for care management and care coordination services at no cost or copay. I was unable to reach you by phone today but would be happy to help you with your health related needs. Please feel free to call me @ [phone].   A member of the Managed Medicaid care management team will reach out to you again over the next 14 days.   Laterrica Libman RN, BSN [place]  [facility] RN Care Coordinator   

## 2022-02-03 NOTE — Patient Outreach (Signed)
Care Coordination  02/03/2022  Evan JAGODA Sr. 1956-06-19 [id number]   Medicaid Managed Care   Unsuccessful Outreach Note  02/03/2022 Name: Evan CIAVARELLA Sr. MRN: [id number] DOB: 1956-01-04  Referred by: Loura Pardon, MD Reason for referral : High Risk Managed Medicaid (Unsuccessful RNCM follow up telephone outreach)   A second unsuccessful telephone outreach was attempted today. The patient was referred to the case management team for assistance with care management and care coordination.   Follow Up Plan: A HIPAA compliant phone message was left for the patient providing contact information and requesting a return call.   Estanislado Emms RN, BSN [place]  [facility]

## 2022-02-05 ENCOUNTER — Telehealth: Payer: Self-pay

## 2022-02-05 NOTE — Telephone Encounter (Signed)
Evan Townsend from [facility] has been unable to reach pt to schedule incontinence supplies. Pt must call [phone]). I tried to call to to let him know. Unable to reach him.

## 2022-02-12 ENCOUNTER — Institutional Professional Consult (permissible substitution): Payer: Medicaid Other | Admitting: Internal Medicine

## 2022-02-12 ENCOUNTER — Telehealth: Payer: Self-pay | Admitting: Internal Medicine

## 2022-02-12 DIAGNOSIS — R531 Weakness: Secondary | ICD-10-CM | POA: Diagnosis not present

## 2022-02-12 NOTE — Telephone Encounter (Signed)
..   Medicaid Managed Care   Unsuccessful Outreach Note  02/12/2022 Name: Evan JUMONVILLE Sr. MRN: [id number] DOB: 01-14-1956  Referred by: Loura Pardon, MD Reason for referral : High Risk Managed Medicaid (I called the patient today to get him rescheduled with the MM RNCM. He did not answer and his VM was full.)   Third unsuccessful telephone outreach was attempted today. The patient was referred to the case management team for assistance with care management and care coordination. The patient's primary care provider has been notified of our unsuccessful attempts to make or maintain contact with the patient. The care management team is pleased to engage with this patient at any time in the future should he/she be interested in assistance from the care management team.   Follow Up Plan: We have been unable to make contact with the patient for follow up. The care management team is available to follow up with the patient after provider conversation with the patient regarding recommendation for care management engagement and subsequent re-referral to the care management team.    Weston Settle Care Guide, High Risk Medicaid Managed Care Embedded Care Coordination [facility]  [facility]    SIGNATURE

## 2022-02-13 ENCOUNTER — Other Ambulatory Visit: Payer: Self-pay | Admitting: *Deleted

## 2022-02-13 NOTE — Patient Outreach (Signed)
Care Coordination  02/13/2022  CHRISTOPHERJAME CARNELL Sr. March 19, 1956 [id number]   Medicaid Managed Care   Unsuccessful Outreach Note  02/13/2022 Name: ELIOR ROBINETTE Sr. MRN: [id number] DOB: Feb 11, 1956  Referred by: Loura Pardon, MD Reason for referral : Case Closure (Case Closure for 3 unsuccessful outreach attempts)   Three unsuccessful telephone outreach attempts have been made. The patient was referred to the case management team for assistance with care management and care coordination. The patient's primary care provider has been notified of our unsuccessful attempts to make or maintain contact with the patient. The care management team is pleased to engage with this patient at any time in the future should he/she be interested in assistance from the care management team.   Follow Up Plan: We have been unable to make contact with the patient for follow up. The care management team is available to follow up with the patient after provider conversation with the patient regarding recommendation for care management engagement and subsequent re-referral to the care management team.   Estanislado Emms RN, BSN [place]  [facility] RN Care Coordinator

## 2022-02-19 ENCOUNTER — Encounter: Payer: Self-pay | Admitting: Medical

## 2022-02-19 ENCOUNTER — Ambulatory Visit (INDEPENDENT_AMBULATORY_CARE_PROVIDER_SITE_OTHER): Payer: Medicare Other | Admitting: Medical

## 2022-02-19 VITALS — BP 130/68 | HR 60 | Ht 66.0 in | Wt 142.1 lb

## 2022-02-19 DIAGNOSIS — I639 Cerebral infarction, unspecified: Secondary | ICD-10-CM | POA: Diagnosis not present

## 2022-02-19 DIAGNOSIS — R001 Bradycardia, unspecified: Secondary | ICD-10-CM

## 2022-02-19 DIAGNOSIS — I441 Atrioventricular block, second degree: Secondary | ICD-10-CM | POA: Diagnosis not present

## 2022-02-19 DIAGNOSIS — I480 Paroxysmal atrial fibrillation: Secondary | ICD-10-CM | POA: Diagnosis not present

## 2022-02-19 DIAGNOSIS — I2510 Atherosclerotic heart disease of native coronary artery without angina pectoris: Secondary | ICD-10-CM

## 2022-02-19 NOTE — Progress Notes (Unsigned)
Cardiology Office Note:    Date:  02/20/2022   ID:  Evan Cones Sr., DOB [birth year], MRN [id number]  PCP:  Charlynne Cousins, MD  [facility] Cardiologist:  Nelva Bush, MD  [facility] Electrophysiologist:  Vickie Epley, MD   Referring MD: Charlynne Cousins, MD   Chief Complaint: Hospital follow-up  History of Present Illness:    Evan MITCHELTREE Sr. is a 66 y.o. male with a hx of strokes, paroxysmal afib, HTN, HLD, poorly controlled diabetes, cerebrovascular and carotid disease, and tobacco who presents for hospital follow-up.  Mr. Comas was admitted to [facility] in January 2021 with chest pain and hyperglycemia. Echo showed normal LVSF. Stress testing was nonischemic. The day after discharge, he presented to [facility] ED secondary to dysarthria, left facial droop, and worsening left-sided weakness. MRI showed acute stroke in the left paramedian pons and right cerebellar hemisphere. He subsequently underwent outpatient monitoring which showed paroxysmal afib and he was placed on Eliquis therapy.    He was admitted in March 2023 for bradycardia with heart rates in the 30s on metoprolol. BB was held on admission. He was treated with glucagon, calcium and dopamine. Heart rates improved off FF. Heart monitor at discharged showed predominately NSR with Mobitz type 1 second degree AV block.    Seen 11/20/21 and was doing well. EKG showed NSR with HR 62bpm.   He was Admitted end of May 2023 with chest pain and bradycardia. He was seen by EP, noted to have significant conduction disease with RBBB, LAFB, and 1st degree AV block. He recommended LHC and cMRI. LHC showed mild to moderate nonobstructive CAD with normal LVEDP. cMRI showed normal LV thickness, LVEF 51%, no infiltrative disease.   He saw EP as outpatient 6/14 and a 2 week heart monitor was recommended.   Heart monitor was not completed.   Today, the patient brought in the heart monitor. the patient reports he has been doing OK. Has  some weakness. No dizziness or lightheadedness. No chest pain or SOB. He has some knee pain. EKG shows NSR with heart rate of 60bpm, RBBB. Cath site is stable.    Past Medical History:  Diagnosis Date   Carotid arterial disease ([place])    a. 08/2018 Carotid U/S: min-mod RICA atherosclerosis w/o hemodynamically significant stenosis. Nl LICA.   Diabetes 1.5, managed as type 2 ([place])    Diastolic dysfunction    a. 08/2018 Echo: EF 65%. No rwma. Gr1 DD. Mild MR.   Diastolic dysfunction    a. 08/2019 Echo: EF 55-60%, Gr1 DD. No rwma. Mild MR. RVSP 37.30mHg.   Hypercholesterolemia    Hypertension    PAF (paroxysmal atrial fibrillation) ([place]    a. 10/2019 Event Monitor: PAF; b. CHA2DS2VASc = 5-->Eliquis.   Poorly controlled diabetes mellitus ([place]    a. 04/2019 A1c 13.8.   Recurrent strokes ([place]    a. 10/2016 MRI/A: Acute 570mR thalamic infarct, ? subacute infarct of R corona radiata; b. 08/2017 MRI/A: Acute 61m60materal L thalamic infarct. Other more remote lacunar infarcts of thalami bilat. Small vessel dzs; c. 08/2018 MRI/A: Acute lacunar infarct of the post limb of R internal capsule; d. 08/2019 MRI Acute CVA of L paramedian pons adn R cerebellar hemisphere.   Tobacco abuse     Past Surgical History:  Procedure Laterality Date   ESOPHAGOGASTRODUODENOSCOPY (EGD) WITH PROPOFOL N/A 04/26/2019   Procedure: ESOPHAGOGASTRODUODENOSCOPY (EGD) WITH PROPOFOL;  Surgeon: VanLin LandsmanD;  Location: [facility] ENDOSCOPY;  Service: Gastroenterology;  Laterality: N/A;   LEFT HEART CATH AND CORONARY ANGIOGRAPHY N/A 01/09/2022   Procedure: LEFT HEART CATH AND CORONARY ANGIOGRAPHY;  Surgeon: Nelva Bush, MD;  Location: [place] CV LAB;  Service: Cardiovascular;  Laterality: N/A;   NO PAST SURGERIES      Current Medications: Current Meds  Medication Sig   ACCU-CHEK GUIDE test strip Check fsbs once daily   Accu-Chek Softclix Lancets lancets SMARTSIG:Topical   albuterol (VENTOLIN HFA) 108 (90 Base)  MCG/ACT inhaler Inhale 2 puffs into the lungs every 6 (six) hours as needed for wheezing or shortness of breath.   amLODipine (NORVASC) 10 MG tablet Take 1 tablet (10 mg total) by mouth daily.   apixaban (ELIQUIS) 5 MG TABS tablet Take 1 tablet (5 mg total) by mouth 2 (two) times daily.   atorvastatin (LIPITOR) 80 MG tablet Take 1 tablet (80 mg total) by mouth every evening. Home med.   Blood Glucose Monitoring Suppl KIT Accucheck Guide with lancets and test strips #100 with one refill.  Test blood sugar twice daily. DX Code E11.65 ; Z79.4   Continuous Blood Gluc Sensor (FREESTYLE LIBRE 2 SENSOR) MISC 1 each by Does not apply route every 14 (fourteen) days.   dapagliflozin propanediol (FARXIGA) 10 MG TABS tablet Take 1 tablet (10 mg total) by mouth daily before breakfast. In place of glipizide   Incontinence Supply Disposable (COMFORT PROTECT ADULT DIAPER/L) MISC 1 Units by Does not apply route daily. Use 3-4 a day as needed   Insulin Pen Needle 32G X 6 MM MISC Daily   iron polysaccharides (NIFEREX) 150 MG capsule Take 1 capsule (150 mg total) by mouth daily. Can take any form of over-the-counter iron supplement.   melatonin 1 MG TABS tablet TAKE 1 TABLET BY MOUTH AT BEDTIME   metoCLOPramide (REGLAN) 5 MG tablet TAKE 1 TABLET BY MOUTH 3 TIMES DAILY BEFORE MEALS   mirtazapine (REMERON) 7.5 MG tablet Take 1 tablet (7.5 mg total) by mouth at bedtime.   Misc. Devices MISC One pair of Compression stockings  Hemiplegia and hemiparesis following cerebral infarction affecting left non-dominant side (HCC)  - Primary Codes: D63.875 Lower extremity edema  Codes: R60.0 Type 2 diabetes mellitus with hyperglycemia, with long-term current use of insulin (HCC)  Codes: E11.65, Z79.4   nitroGLYCERIN (NITROSTAT) 0.4 MG SL tablet Place 1 tablet (0.4 mg total) under the tongue every 5 (five) minutes as needed for chest pain.   omeprazole (PRILOSEC) 40 MG capsule Take 1 capsule (40 mg total) by mouth in the morning  and at bedtime. Home med.   tamsulosin (FLOMAX) 0.4 MG CAPS capsule Take 1 capsule (0.4 mg total) by mouth daily.     Allergies:   Patient has no known allergies.   Social History   Socioeconomic History   Marital status: Married    Spouse name: Presenter, broadcasting   Number of children: 5   Years of education: Not on file   Highest education level: Not on file  Occupational History   Occupation: unemployed  Tobacco Use   Smoking status: Former    Packs/day: 1.00    Years: 38.00    Total pack years: 38.00    Types: Cigarettes    Start date: 1982   Smokeless tobacco: Never  Vaping Use   Vaping Use: Never used  Substance and Sexual Activity   Alcohol use: Not Currently    Alcohol/week: 1.0 standard drink of alcohol    Types: 1 Cans of beer per week  Comment: previously drank but nothing in 1-2 yrs (08/2019).   Drug use: Not Currently    Types: Cocaine, Marijuana    Comment: prev used cocaine/marijuana but none x 1-2 yrs (08/2019).   Sexual activity: Yes  Other Topics Concern   Not on file  Social History Narrative   Lives in [place] with his wife.  Uses a cane when ambulating.  Can walk about 25 yds prior to having to rest - says he stumbles if he has to work further than that.  Usually uses a scooter to get around stores.   Social Determinants of Health   Financial Resource Strain: High Risk (01/13/2018)   Overall Financial Resource Strain (CARDIA)    Difficulty of Paying Living Expenses: Very hard  Food Insecurity: No Food Insecurity (01/13/2018)   Hunger Vital Sign    Worried About Running Out of Food in the Last Year: Never true    Ran Out of Food in the Last Year: Never true  Transportation Needs: No Transportation Needs (12/06/2018)   PRAPARE - Hydrologist (Medical): No    Lack of Transportation (Non-Medical): No  Physical Activity: Unknown (11/25/2018)   Exercise Vital Sign    Days of Exercise per Week: 0 days    Minutes of Exercise  per Session: Not asked  Stress: No Stress Concern Present (11/25/2018)   [place]    Feeling of Stress : Only a little  Social Connections: Moderately Integrated (01/13/2018)   Social Connection and Isolation Panel [NHANES]    Frequency of Communication with Friends and Family: More than three times a week    Frequency of Social Gatherings with Friends and Family: Once a week    Attends Religious Services: More than 4 times per year    Active Member of [employer] or Organizations: No    Attends Music therapist: Never    Marital Status: Married     Family History: The patient's family history includes Diabetes in his father; Heart attack in his father; Hypertension in his father and mother; Stroke in his mother.  ROS:   Please see the history of present illness.     All other systems reviewed and are negative.  EKGs/Labs/Other Studies Reviewed:    The following studies were reviewed today:  Cardiac cath 12/2021 Conclusions:  Mild-moderate, non-obstructive coronary artery disease with up to 50% stenoses involving D2, small mid LCx, and ostial RCA. Normal left ventricular filling pressure (LVEDP 12 mmHg).   Recommendations: Medical therapy and risk factor modification to prevent progression of coronary artery disease. Ongoing management of symptomatic bradycardia per primary teams and electrophysiology.   Nelva Bush, MD [facility]  Cardiac MRI 12/2021 IMPRESSION: 1.  Normal LV size and function, LVEF 51%   2.  Normal RV size and function.   3.  No delayed enhancement or scar noted   4.  Normal native T1 value, elevated ECV.   5. With native T1 value being normal, elevated ECV likely influenced by motion artifact.   6. No findings to suggest infiltrative disease such as sarcoid or amyloid.     Electronically Signed   By: Kate Sable M.D.   On: 01/07/2022 19:14  Echo 10/2021   1. Left ventricular ejection fraction, by estimation, is 50 to 55%. The  left ventricle has low normal function. The left ventricle has no regional  wall motion abnormalities. Left ventricular diastolic parameters are  consistent with  Grade I diastolic  dysfunction (impaired relaxation).   2. Right ventricular systolic function is normal. The right ventricular  size is normal. There is normal pulmonary artery systolic pressure. The  estimated right ventricular systolic pressure is 50.5 mmHg.   3. The mitral valve is normal in structure. Mild mitral valve  regurgitation. No evidence of mitral stenosis.   4. The aortic valve is tricuspid. Aortic valve regurgitation is not  visualized. No aortic stenosis is present.   5. The inferior vena cava is normal in size with greater than 50%  respiratory variability, suggesting right atrial pressure of 3 mmHg.   EKG:  EKG is  ordered today.  The ekg ordered today demonstrates NSR 60bpm, LAD, RBBB, nonspecific T wave changes  Recent Labs: 01/06/2022: ALT 77 01/07/2022: TSH 1.870 01/10/2022: BUN 19; Creatinine, Ser 1.66; Hemoglobin 9.2; Magnesium 2.1; Platelets 207; Potassium 3.5; Sodium 145  Recent Lipid Panel    Component Value Date/Time   CHOL 130 01/07/2022 0321   CHOL 134 11/12/2021 1532   TRIG 181 (H) 01/07/2022 0321   HDL 59 01/07/2022 0321   HDL 53 11/12/2021 1532   CHOLHDL 2.2 01/07/2022 0321   VLDL 36 01/07/2022 0321   LDLCALC 35 01/07/2022 0321   LDLCALC 52 11/12/2021 1532   LDLCALC 65 02/13/2020 1417     Physical Exam:    VS:  BP 130/68 (BP Location: Right Arm, Patient Position: Sitting, Cuff Size: Normal)   Pulse 60   Ht _0  (1.676 m)   Wt 142 lb 2 oz (64.5 kg)   SpO2 98%   BMI 22.94 kg/m     Wt Readings from Last 3 Encounters:  02/19/22 142 lb 2 oz (64.5 kg)  01/28/22 140 lb 12.8 oz (63.9 kg)  01/13/22 148 lb (67.1 kg)     GEN:  Well nourished, well developed in no acute distress HEENT: Normal NECK: No JVD; No  carotid bruits LYMPHATICS: No lymphadenopathy CARDIAC: RRR, no murmurs, rubs, gallops RESPIRATORY:  Clear to auscultation without rales, wheezing or rhonchi  ABDOMEN: Soft, non-tender, non-distended MUSCULOSKELETAL:  No edema; No deformity  SKIN: Warm and dry NEUROLOGIC:  Alert and oriented x 3 PSYCHIATRIC:  Normal affect   ASSESSMENT:    1. Bradycardia   2. Heart block AV second degree   3. Coronary artery disease involving native coronary artery of native heart without angina pectoris   4. Paroxysmal A-fib ([place])   5. Recurrent strokes (HCC)    PLAN:    In order of problems listed above:  Symptomatic Bradycardia H/o Mobitz Type 1 and 2nd degree AVB Noted to have symptomatic bradycardia in the hospital. LHC showed mild to moderate nonobstructive CAD with normal LVEDP. cMRI showed normal LV thickness, LVEF 51%, no infiltrative disease. Patient saw EP and they recommended a heart monitor. This has not been completed yet. EKG today showed NSR with heart rate of 60bpm with RBBB.  Further recs pending heart monitor.   Coronary artery calcification LHC showed nonobstructive CAD as above. No ASA with Eliquis. Continue statin therapy. NO BB for the above issues. No further ischemic work-up indicated.   Paroxysmal Afib He is in NSR. BB stopped for prior Mobitz type 1 and 2nd degree AV block as above. CHADSVASC of 5. Continue Eliquis 25m BID.  Recurrent CVA No new deficits. Continue Eliquis and statin.    Disposition: Follow up in 3 month(s) with MD/APP     Signed, Raksha Wolfgang HNinfa Meeker PA-C  02/20/2022 4:45 PM  [facility]

## 2022-02-19 NOTE — Patient Instructions (Signed)
Medication Instructions:  NONE *If you need a refill on your cardiac medications before your next appointment, please call your pharmacy*   Lab Work: NONE If you have labs (blood work) drawn today and your tests are completely normal, you will receive your results only by: MyChart Message (if you have MyChart) OR A paper copy in the mail If you have any lab test that is abnormal or we need to change your treatment, we will call you to review the results.   Testing/Procedures: NONE   Follow-Up: At [facility], you and your health needs are our priority.  As part of our continuing mission to provide you with exceptional heart care, we have created designated Provider Care Teams.  These Care Teams include your primary Cardiologist (physician) and Advanced Practice Providers (APPs -  Physician Assistants and Nurse Practitioners) who all work together to provide you with the care you need, when you need it.  We recommend signing up for the patient portal called "MyChart".  Sign up information is provided on this After Visit Summary.  MyChart is used to connect with patients for Virtual Visits (Telemedicine).  Patients are able to view lab/test results, encounter notes, upcoming appointments, etc.  Non-urgent messages can be sent to your provider as well.   To learn more about what you can do with MyChart, go to [email or web address].    Your next appointment:   3 month(s)  The format for your next appointment:   In Person  Provider:   You may see [name], MD or one of the following Advanced Practice Providers on your designated Care Team:   Nicolasa Ducking, NP Eula Listen, PA-C Cadence Fransico Michael, PA-C{   Other Instructions   Important Information About Sugar

## 2022-02-25 ENCOUNTER — Encounter: Payer: Self-pay | Admitting: Urology

## 2022-02-25 ENCOUNTER — Ambulatory Visit (INDEPENDENT_AMBULATORY_CARE_PROVIDER_SITE_OTHER): Payer: Medicare Other | Admitting: Urology

## 2022-02-25 ENCOUNTER — Other Ambulatory Visit: Payer: Self-pay

## 2022-02-25 VITALS — BP 146/79 | HR 54

## 2022-02-25 DIAGNOSIS — E1159 Type 2 diabetes mellitus with other circulatory complications: Secondary | ICD-10-CM

## 2022-02-25 DIAGNOSIS — K219 Gastro-esophageal reflux disease without esophagitis: Secondary | ICD-10-CM

## 2022-02-25 DIAGNOSIS — R339 Retention of urine, unspecified: Secondary | ICD-10-CM | POA: Diagnosis not present

## 2022-02-25 DIAGNOSIS — Z794 Long term (current) use of insulin: Secondary | ICD-10-CM

## 2022-02-25 DIAGNOSIS — N401 Enlarged prostate with lower urinary tract symptoms: Secondary | ICD-10-CM | POA: Diagnosis not present

## 2022-02-25 DIAGNOSIS — I4891 Unspecified atrial fibrillation: Secondary | ICD-10-CM

## 2022-02-25 DIAGNOSIS — N138 Other obstructive and reflux uropathy: Secondary | ICD-10-CM | POA: Diagnosis not present

## 2022-02-25 DIAGNOSIS — N3281 Overactive bladder: Secondary | ICD-10-CM | POA: Diagnosis not present

## 2022-02-25 LAB — BLADDER SCAN AMB NON-IMAGING

## 2022-02-25 MED ORDER — TAMSULOSIN HCL 0.4 MG PO CAPS: 0.4000 mg | ORAL_CAPSULE | Freq: Every day | ORAL | 3 refills | Status: DC

## 2022-02-25 MED ORDER — APIXABAN 5 MG PO TABS: 5.0000 mg | ORAL_TABLET | Freq: Two times a day (BID) | ORAL | 0 refills | Status: DC

## 2022-02-25 MED ORDER — ATORVASTATIN CALCIUM 80 MG PO TABS: 80.0000 mg | ORAL_TABLET | Freq: Every evening | ORAL | 0 refills | Status: DC

## 2022-02-25 MED ORDER — AMLODIPINE BESYLATE 10 MG PO TABS: 10.0000 mg | ORAL_TABLET | Freq: Every day | ORAL | 0 refills | Status: DC

## 2022-02-25 MED ORDER — OMEPRAZOLE 40 MG PO CPDR: 40.0000 mg | DELAYED_RELEASE_CAPSULE | Freq: Two times a day (BID) | ORAL | 0 refills | Status: DC

## 2022-02-25 NOTE — Progress Notes (Signed)
   02/25/2022 12:50 PM   Sandie Ano Sr. 1956/03/04 [id number]  Reason for visit: Urinary symptoms, history of urinary retention, meatal stenosis, overactive bladder  HPI: 66 year old comorbid male with extensive history of prior stroke who was admitted in May 2023 for sepsis secondary to pneumonia and developed urinary retention at that time.  Dr. Pete Glatter ultimately needed to perform urethral dilation of a meatal stenosis/distal stricture.  His Foley was removed on 01/14/2022, and he has been voiding spontaneously since that time.  He has had some return to overactive bladder symptoms with some urgency, frequency, and occasional urge incontinence that requires depends.  He denies any dysuria or gross hematuria.  Bladder scan today 200 mL, but last void was early this morning.  He really denies significant urinary complaints today.  We discussed the overlap between BPH and overactive bladder, especially with his history of stroke.  With his history of urinary retention, I would avoid OAB medications at this time, and would certainly avoid anticholinergics with his frailty and high fall risk.  May consider low-dose 25 mg Myrbetriq in the future if OAB symptoms worsening, but risk of retention is certainly possible.  Continue Flomax for BPH Behavioral strategies discussed including cutting back on diet Gatorade and tea RTC 6 months PVR  Sondra Come, MD  [facility] [address] [place], [place] [address] [phone]

## 2022-02-26 NOTE — Telephone Encounter (Signed)
Pt scheduled 7/19

## 2022-03-04 ENCOUNTER — Ambulatory Visit: Payer: Medicaid Other | Admitting: Unknown Physician Specialty

## 2022-03-13 NOTE — Addendum Note (Signed)
Addended by: Iverson Alamin C on: 03/13/2022 11:11 AM   Modules accepted: Orders

## 2022-03-19 ENCOUNTER — Encounter: Payer: Self-pay | Admitting: Podiatry

## 2022-03-19 ENCOUNTER — Ambulatory Visit (INDEPENDENT_AMBULATORY_CARE_PROVIDER_SITE_OTHER): Payer: Medicare Other | Admitting: Podiatry

## 2022-03-19 DIAGNOSIS — M79674 Pain in right toe(s): Secondary | ICD-10-CM | POA: Diagnosis not present

## 2022-03-19 DIAGNOSIS — N182 Chronic kidney disease, stage 2 (mild): Secondary | ICD-10-CM | POA: Diagnosis not present

## 2022-03-19 DIAGNOSIS — E1142 Type 2 diabetes mellitus with diabetic polyneuropathy: Secondary | ICD-10-CM

## 2022-03-19 DIAGNOSIS — D689 Coagulation defect, unspecified: Secondary | ICD-10-CM | POA: Diagnosis not present

## 2022-03-19 DIAGNOSIS — M79675 Pain in left toe(s): Secondary | ICD-10-CM | POA: Diagnosis not present

## 2022-03-19 DIAGNOSIS — B351 Tinea unguium: Secondary | ICD-10-CM | POA: Diagnosis not present

## 2022-03-19 NOTE — Progress Notes (Signed)
This patient returns to my office for at risk foot care.  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 Patient is taking eliquis.  He is accompanied by his daughter..This patient is unable to cut nails himself since the patient cannot reach his nails.These nails are painful walking and wearing shoes.  This patient presents for at risk foot care today.  General Appearance  Alert, conversant and in no acute stress.  Vascular  Dorsalis pedis and posterior tibial  pulses are palpable  bilaterally.  Capillary return is within normal limits  bilaterally. Temperature is within normal limits  bilaterally.  Neurologic  Senn-Weinstein monofilament wire test within normal limits  bilaterally. Muscle power within normal limits bilaterally.  Nails Thick disfigured discolored nails with subungual debris  from hallux to fifth toes bilaterally. No evidence of bacterial infection or drainage bilaterally.  Orthopedic  No limitations of motion  feet .  No crepitus or effusions noted.  No bony pathology or digital deformities noted.  Skin  normotropic skin with no porokeratosis noted bilaterally.  No signs of infections or ulcers noted.     Onychomycosis  Pain in right toes  Pain in left toes  Consent was obtained for treatment procedures.   Mechanical debridement of nails 1-5  bilaterally performed with a nail nipper.  Filed with dremel without incident. No infection or ulcer.     Return office visit   10 weeks                  Told patient to return for periodic foot care and evaluation due to potential at risk complications.   Helane Gunther DPM

## 2022-03-26 ENCOUNTER — Other Ambulatory Visit: Payer: Self-pay | Admitting: Nurse Practitioner

## 2022-03-26 DIAGNOSIS — K219 Gastro-esophageal reflux disease without esophagitis: Secondary | ICD-10-CM

## 2022-03-26 DIAGNOSIS — Z794 Long term (current) use of insulin: Secondary | ICD-10-CM

## 2022-03-26 DIAGNOSIS — I4891 Unspecified atrial fibrillation: Secondary | ICD-10-CM

## 2022-03-27 NOTE — Telephone Encounter (Signed)
Requested medication (s) are due for refill today: Due 03/28/22  Requested medication (s) are on the active medication list: yes    Last refill: 02/25/22  #60  0 refills  Future visit scheduled no  Notes to clinic:Failed due to labs, please review. Thank you.  Requested Prescriptions  Pending Prescriptions Disp Refills   ELIQUIS 5 MG TABS tablet [Pharmacy Med Name: ELIQUIS 5 MG TAB] 60 tablet 0    Sig: TAKE 1 TABLET BY MOUTH TWICE A DAY     Hematology:  Anticoagulants - apixaban Failed - 03/26/2022  5:49 PM      Failed - HGB in normal range and within 360 days    Hemoglobin  Date Value Ref Range Status  01/10/2022 9.2 (L) 13.0 - 17.0 g/dL Final  01/06/2022 9.0 (L) 13.0 - 17.7 g/dL Final         Failed - HCT in normal range and within 360 days    HCT  Date Value Ref Range Status  01/10/2022 30.5 (L) 39.0 - 52.0 % Final   Hematocrit  Date Value Ref Range Status  01/06/2022 26.9 (L) 37.5 - 51.0 % Final         Failed - Cr in normal range and within 360 days    Creat  Date Value Ref Range Status  05/16/2020 1.38 (H) 0.70 - 1.25 mg/dL Final    Comment:    For patients >12 years of age, the reference limit for Creatinine is approximately 13% higher for people identified as African-American. .    Creatinine, Ser  Date Value Ref Range Status  01/10/2022 1.66 (H) 0.61 - 1.24 mg/dL Final   Creatinine, Urine  Date Value Ref Range Status  01/31/2019 96 20 - 320 mg/dL Final         Failed - ALT in normal range and within 360 days    ALT  Date Value Ref Range Status  01/06/2022 77 (H) 0 - 44 U/L Final   SGPT (ALT)  Date Value Ref Range Status  03/15/2013 31 12 - 78 U/L Final         Passed - PLT in normal range and within 360 days    Platelets  Date Value Ref Range Status  01/10/2022 207 150 - 400 K/uL Final  01/06/2022 206 150 - 450 x10E3/uL Final         Passed - AST in normal range and within 360 days    AST  Date Value Ref Range Status  01/06/2022 41 15 -  41 U/L Final   SGOT(AST)  Date Value Ref Range Status  03/15/2013 11 (L) 15 - 37 Unit/L Final         Passed - Valid encounter within last 12 months    Recent Outpatient Visits           2 months ago Pneumonia due to infectious organism, unspecified laterality, unspecified part of lung   [facility] Vigg, Avanti, MD   2 months ago Essential hypertension   [facility] Vigg, Avanti, MD   2 months ago Acute renal failure, unspecified acute renal failure type ([place])   [facility] Vigg, Avanti, MD   3 months ago Elevated LFTs   [facility] Vigg, Avanti, MD   4 months ago Essential hypertension   [place], MD       Future Appointments             In 1 month Kathlen Mody,  Cadence H, PA-C [facility], [facility]   In 4 months Lanier Prude, MD [facility], [facility]            Signed Prescriptions Disp Refills   omeprazole (PRILOSEC) 40 MG capsule 60 capsule 0    Sig: TAKE 1 CAPSULE BY MOUTH IN THE MORNING AND AT BEDTIME     Gastroenterology: Proton Pump Inhibitors Passed - 03/26/2022  5:49 PM      Passed - Valid encounter within last 12 months    Recent Outpatient Visits           2 months ago Pneumonia due to infectious organism, unspecified laterality, unspecified part of lung   [facility] Vigg, Avanti, MD   2 months ago Essential hypertension   [facility] Vigg, Avanti, MD   2 months ago Acute renal failure, unspecified acute renal failure type (HCC)   [facility] Vigg, Avanti, MD   3 months ago Elevated LFTs   [facility] Vigg, Avanti, MD   4 months ago Essential hypertension   [facility] Vigg, Chief Operating Officer, MD       Future Appointments             In 1 month Furth, Cadence H, PA-C [facility], [facility]   In 4 months Lanier Prude, MD [facility],  [facility]             atorvastatin (LIPITOR) 80 MG tablet 30 tablet 0    Sig: TAKE 1 TABLET BY MOUTH EVERY EVENING     Cardiovascular:  Antilipid - Statins Failed - 03/26/2022  5:49 PM      Failed - Lipid Panel in normal range within the last 12 months    Cholesterol, Total  Date Value Ref Range Status  11/12/2021 134 100 - 199 mg/dL Final   Cholesterol  Date Value Ref Range Status  01/07/2022 130 0 - 200 mg/dL Final   LDL Cholesterol (Calc)  Date Value Ref Range Status  02/13/2020 65 mg/dL (calc) Final    Comment:    Reference range: <100 . Desirable range <100 mg/dL for primary prevention;   <70 mg/dL for patients with CHD or diabetic patients  with > or = 2 CHD risk factors. Marland Kitchen LDL-C is now calculated using the Martin-Hopkins  calculation, which is a validated novel method providing  better accuracy than the Friedewald equation in the  estimation of LDL-C.  Horald Pollen et al. Lenox Ahr. 1914;782(95): 2061-2068  (http://education.[facility].com/faq/FAQ164)    LDL Chol Calc (NIH)  Date Value Ref Range Status  11/12/2021 52 0 - 99 mg/dL Final   LDL Cholesterol  Date Value Ref Range Status  01/07/2022 35 0 - 99 mg/dL Final    Comment:           Total Cholesterol/HDL:CHD Risk Coronary Heart Disease Risk Table                     Men   Women  1/2 Average Risk   3.4   3.3  Average Risk       5.0   4.4  2 X Average Risk   9.6   7.1  3 X Average Risk  23.4   11.0        Use the calculated Patient Ratio above and the CHD Risk Table to determine the patient's CHD Risk.        ATP III CLASSIFICATION (LDL):  <100  mg/dL   Optimal  824-235  mg/dL   Near or Above                    Optimal  130-159  mg/dL   Borderline  361-443  mg/dL   High  >154     mg/dL   Very High Performed at [facility], [address] [place], [place] [address]    HDL  Date Value Ref Range Status  01/07/2022 59 >40 mg/dL Final  61/95/0932 53 >67 mg/dL Final    Triglycerides  Date Value Ref Range Status  01/07/2022 181 (H) <150 mg/dL Final         Passed - Patient is not pregnant      Passed - Valid encounter within last 12 months    Recent Outpatient Visits           2 months ago Pneumonia due to infectious organism, unspecified laterality, unspecified part of lung   [facility] Vigg, Avanti, MD   2 months ago Essential hypertension   [facility] Vigg, Avanti, MD   2 months ago Acute renal failure, unspecified acute renal failure type (HCC)   [facility] Vigg, Avanti, MD   3 months ago Elevated LFTs   [facility] Vigg, Avanti, MD   4 months ago Essential hypertension   [facility] Vigg, Chief Operating Officer, MD       Future Appointments             In 1 month Furth, Cadence H, PA-C [facility], [facility]   In 4 months Lalla Brothers, Rossie Muskrat, MD [facility], [facility]             amLODipine (NORVASC) 10 MG tablet 30 tablet 0    Sig: TAKE 1 TABLET BY MOUTH DAILY     Cardiovascular: Calcium Channel Blockers 2 Failed - 03/26/2022  5:49 PM      Failed - Last BP in normal range    BP Readings from Last 1 Encounters:  02/25/22 (!) 146/79         Passed - Last Heart Rate in normal range    Pulse Readings from Last 1 Encounters:  02/25/22 (!) 54         Passed - Valid encounter within last 6 months    Recent Outpatient Visits           2 months ago Pneumonia due to infectious organism, unspecified laterality, unspecified part of lung   [facility] Vigg, Avanti, MD   2 months ago Essential hypertension   [facility] Vigg, Avanti, MD   2 months ago Acute renal failure, unspecified acute renal failure type (HCC)   [facility] Vigg, Avanti, MD   3 months ago Elevated LFTs   [facility] Vigg, Avanti, MD   4 months ago Essential hypertension   [facility] Vigg, Chief Operating Officer, MD        Future Appointments             In 1 month Furth, Cadence H, PA-C [facility], [facility]   In 4 months Lalla Brothers, Rossie Muskrat, MD [facility], [facility]

## 2022-03-27 NOTE — Telephone Encounter (Signed)
Requested Prescriptions  Pending Prescriptions Disp Refills  . omeprazole (PRILOSEC) 40 MG capsule [Pharmacy Med Name: OMEPRAZOLE DR 40 MG CAP] 60 capsule 0    Sig: TAKE 1 CAPSULE BY MOUTH IN THE MORNING AND AT BEDTIME     Gastroenterology: Proton Pump Inhibitors Passed - 03/26/2022  5:49 PM      Passed - Valid encounter within last 12 months    Recent Outpatient Visits          2 months ago Pneumonia due to infectious organism, unspecified laterality, unspecified part of lung   [facility] Vigg, Avanti, MD   2 months ago Essential hypertension   [facility] Vigg, Avanti, MD   2 months ago Acute renal failure, unspecified acute renal failure type (HCC)   [facility] Vigg, Avanti, MD   3 months ago Elevated LFTs   [facility] Vigg, Avanti, MD   4 months ago Essential hypertension   [facility] Vigg, Chief Operating Officer, MD      Future Appointments            In 1 month Furth, Cadence H, PA-C [facility], [facility]   In 4 months Lalla Brothers, Rossie Muskrat, MD [facility], [facility]           . ELIQUIS 5 MG TABS tablet [Pharmacy Med Name: ELIQUIS 5 MG TAB] 60 tablet 0    Sig: TAKE 1 TABLET BY MOUTH TWICE A DAY     Hematology:  Anticoagulants - apixaban Failed - 03/26/2022  5:49 PM      Failed - HGB in normal range and within 360 days    Hemoglobin  Date Value Ref Range Status  01/10/2022 9.2 (L) 13.0 - 17.0 g/dL Final  69/67/8938 9.0 (L) 13.0 - 17.7 g/dL Final         Failed - HCT in normal range and within 360 days    HCT  Date Value Ref Range Status  01/10/2022 30.5 (L) 39.0 - 52.0 % Final   Hematocrit  Date Value Ref Range Status  01/06/2022 26.9 (L) 37.5 - 51.0 % Final         Failed - Cr in normal range and within 360 days    Creat  Date Value Ref Range Status  05/16/2020 1.38 (H) 0.70 - 1.25 mg/dL Final    Comment:    For patients >25 years of age, the reference limit for  Creatinine is approximately 13% higher for people identified as African-American. .    Creatinine, Ser  Date Value Ref Range Status  01/10/2022 1.66 (H) 0.61 - 1.24 mg/dL Final   Creatinine, Urine  Date Value Ref Range Status  01/31/2019 96 20 - 320 mg/dL Final         Failed - ALT in normal range and within 360 days    ALT  Date Value Ref Range Status  01/06/2022 77 (H) 0 - 44 U/L Final   SGPT (ALT)  Date Value Ref Range Status  03/15/2013 31 12 - 78 U/L Final         Passed - PLT in normal range and within 360 days    Platelets  Date Value Ref Range Status  01/10/2022 207 150 - 400 K/uL Final  01/06/2022 206 150 - 450 x10E3/uL Final         Passed - AST in normal range and within 360 days    AST  Date Value Ref Range Status  01/06/2022 41 15 -  41 U/L Final   SGOT(AST)  Date Value Ref Range Status  03/15/2013 11 (L) 15 - 37 Unit/L Final         Passed - Valid encounter within last 12 months    Recent Outpatient Visits          2 months ago Pneumonia due to infectious organism, unspecified laterality, unspecified part of lung   [facility] Vigg, Avanti, MD   2 months ago Essential hypertension   [facility] Vigg, Avanti, MD   2 months ago Acute renal failure, unspecified acute renal failure type (HCC)   [facility] Vigg, Avanti, MD   3 months ago Elevated LFTs   [facility] Vigg, Avanti, MD   4 months ago Essential hypertension   [facility] Vigg, Chief Operating Officer, MD      Future Appointments            In 1 month Furth, Cadence H, PA-C [facility], [facility]   In 4 months Lalla Brothers, Rossie Muskrat, MD [facility], [facility]           . atorvastatin (LIPITOR) 80 MG tablet [Pharmacy Med Name: ATORVASTATIN CALCIUM 80 MG TAB] 30 tablet 0    Sig: TAKE 1 TABLET BY MOUTH EVERY EVENING     Cardiovascular:  Antilipid - Statins Failed - 03/26/2022  5:49 PM      Failed - Lipid  Panel in normal range within the last 12 months    Cholesterol, Total  Date Value Ref Range Status  11/12/2021 134 100 - 199 mg/dL Final   Cholesterol  Date Value Ref Range Status  01/07/2022 130 0 - 200 mg/dL Final   LDL Cholesterol (Calc)  Date Value Ref Range Status  02/13/2020 65 mg/dL (calc) Final    Comment:    Reference range: <100 . Desirable range <100 mg/dL for primary prevention;   <70 mg/dL for patients with CHD or diabetic patients  with > or = 2 CHD risk factors. Marland Kitchen LDL-C is now calculated using the Martin-Hopkins  calculation, which is a validated novel method providing  better accuracy than the Friedewald equation in the  estimation of LDL-C.  Horald Pollen et al. Lenox Ahr. 6761;950(93): 2061-2068  (http://education.[facility].com/faq/FAQ164)    LDL Chol Calc (NIH)  Date Value Ref Range Status  11/12/2021 52 0 - 99 mg/dL Final   LDL Cholesterol  Date Value Ref Range Status  01/07/2022 35 0 - 99 mg/dL Final    Comment:           Total Cholesterol/HDL:CHD Risk Coronary Heart Disease Risk Table                     Men   Women  1/2 Average Risk   3.4   3.3  Average Risk       5.0   4.4  2 X Average Risk   9.6   7.1  3 X Average Risk  23.4   11.0        Use the calculated Patient Ratio above and the CHD Risk Table to determine the patient's CHD Risk.        ATP III CLASSIFICATION (LDL):  <100     mg/dL   Optimal  267-124  mg/dL   Near or Above                    Optimal  130-159  mg/dL   Borderline  580-998  mg/dL  High  >190     mg/dL   Very High Performed at [facility], [place], [place] [address]    HDL  Date Value Ref Range Status  01/07/2022 59 >40 mg/dL Final  11/12/2021 53 >39 mg/dL Final   Triglycerides  Date Value Ref Range Status  01/07/2022 181 (H) <150 mg/dL Final         Passed - Patient is not pregnant      Passed - Valid encounter within last 12 months    Recent Outpatient Visits          2  months ago Pneumonia due to infectious organism, unspecified laterality, unspecified part of lung   [facility] Vigg, Avanti, MD   2 months ago Essential hypertension   [facility] Vigg, Avanti, MD   2 months ago Acute renal failure, unspecified acute renal failure type ([place])   [facility] Vigg, Avanti, MD   3 months ago Elevated LFTs   [facility] Vigg, Avanti, MD   4 months ago Essential hypertension   [place] Vigg, Customer service manager, MD      Future Appointments            In 1 month [place], [place], PA-C [place], [place]   In 4 months Quentin Ore, Hilton [place], MD [facility], [facility]           . amLODipine (NORVASC) 10 MG tablet [Pharmacy Med Name: AMLODIPINE BESYLATE 10 MG TAB] 30 tablet 0    Sig: TAKE 1 TABLET BY MOUTH DAILY     Cardiovascular: Calcium Channel Blockers 2 Failed - 03/26/2022  5:49 PM      Failed - Last BP in normal range    BP Readings from Last 1 Encounters:  02/25/22 (!) 146/79         Passed - Last Heart Rate in normal range    Pulse Readings from Last 1 Encounters:  02/25/22 (!) 28         Passed - Valid encounter within last 6 months    Recent Outpatient Visits          2 months ago Pneumonia due to infectious organism, unspecified laterality, unspecified part of lung   [facility] Vigg, Avanti, MD   2 months ago Essential hypertension   [place] Vigg, Avanti, MD   2 months ago Acute renal failure, unspecified acute renal failure type ([place])   [facility] Vigg, Avanti, MD   3 months ago Elevated LFTs   [place] Vigg, Avanti, MD   4 months ago Essential hypertension   [place] Vigg, Customer service manager, MD      Future Appointments            In 33 month Furth, [place], PA-C [place], [facility]   In 4 months Quentin Ore, Hilton Cork, MD [facility],  [place]

## 2022-03-30 NOTE — Telephone Encounter (Signed)
LVM asking patient to call back to schedule an appt 

## 2022-04-02 NOTE — Telephone Encounter (Signed)
Pt scheduled  

## 2022-04-03 ENCOUNTER — Encounter (INDEPENDENT_AMBULATORY_CARE_PROVIDER_SITE_OTHER): Payer: Medicaid Other | Admitting: Vascular Surgery

## 2022-04-06 ENCOUNTER — Ambulatory Visit (INDEPENDENT_AMBULATORY_CARE_PROVIDER_SITE_OTHER): Payer: Medicare Other | Admitting: Physician Assistant

## 2022-04-06 ENCOUNTER — Encounter: Payer: Self-pay | Admitting: Physician Assistant

## 2022-04-06 VITALS — BP 152/72 | HR 49 | Temp 98.6°F | Wt 155.7 lb

## 2022-04-06 DIAGNOSIS — I69354 Hemiplegia and hemiparesis following cerebral infarction affecting left non-dominant side: Secondary | ICD-10-CM

## 2022-04-06 DIAGNOSIS — I639 Cerebral infarction, unspecified: Secondary | ICD-10-CM

## 2022-04-06 DIAGNOSIS — N3942 Incontinence without sensory awareness: Secondary | ICD-10-CM | POA: Diagnosis not present

## 2022-04-06 MED ORDER — COMFORT PROTECT ADULT DIAPER/L MISC: 1.0000 [IU] | Freq: Every day | 3 refills | Status: DC

## 2022-04-06 NOTE — Assessment & Plan Note (Signed)
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  Follow up as needed.

## 2022-04-06 NOTE — Progress Notes (Signed)
Established Patient Office Visit  Name: Evan WINTHROP Sr.   MRN: [id number]    DOB: [birth year]   Date:04/06/2022  Today's Provider: Talitha Givens, MHS, PA-C Introduced myself to the patient as a PA-C and provided education on APPs in clinical practice.         Subjective  Chief Complaint  Chief Complaint  Patient presents with   Referral     Patient would like referrals to help with ADL's- PT, OT   Neurology    Pt's caregiver states that the patient needs to see Neurology due to having several strokes since 2021    HPI  ADLs - patient's caregiver reports he needs to be referred to PT and OT  States this is to help with improving walking and speech and assisting with care tasks  Patient states he would like to regain ability to use left hand  Patient's caregiver was told she needs to take him to Neurology       Patient Active Problem List   Diagnosis Date Noted   Hypothermia 01/10/2022   Acute on chronic anemia 01/08/2022   Symptomatic bradycardia 01/07/2022   BPH (benign prostatic hyperplasia) 01/07/2022   GERD without esophagitis 01/07/2022   Paroxysmal atrial fibrillation ([place]) 01/07/2022   Dyslipidemia 01/07/2022   Diabetes mellitus ([place]) 01/01/2022   Acute renal failure ([place]) 01/01/2022   Elevated liver enzymes 12/28/2021   Pneumonia 11/26/2021   Sepsis due to pneumonia ([place]) 11/26/2021   CAP (community acquired pneumonia) 11/26/2021   AKI (acute kidney injury) ([place]) 11/26/2021   Sinus pause    Bradycardia 10/16/2021   Hypertensive emergency without congestive heart failure 09/14/2021   Hypertension    Unstable angina ([place])    Diabetes mellitus without complication ([place]) 94/85/4627   Primary hypertension 06/27/2021   Diabetic ulcer of toe of left foot associated with type 2 diabetes mellitus, limited to breakdown of skin ([place]) 06/27/2021   Need for pneumococcal vaccination 06/20/2021   Cerebrovascular accident (CVA) ([place]) 06/05/2021   Need for  influenza vaccination 06/02/2021   Constipation 06/02/2021   Insomnia 06/02/2021   Gastroparesis 02/26/2021   Moderate major depression ([place]) 02/26/2021   Muscle spasm of left lower extremity 02/26/2021   History of stroke 02/26/2021   Type 2 diabetes mellitus with hyperglycemia ([place]) 03/50/0938   Diastolic dysfunction    History of diabetes with ketoacidosis 09/20/2020   CKD (chronic kidney disease) stage 2, GFR 60-89 ml/min 06/27/2020   Monitoring for anticoagulant use 06/27/2020   Lower extremity edema 05/16/2020   Atrial fibrillation, chronic ([place]) 10/26/2019   Anemia of chronic disease 09/05/2019   Acute kidney injury superimposed on CKD ([place]) 09/05/2019   Coagulopathy ([place]) 06/12/2019   Gastroesophageal reflux disease    Hemiplegia and hemiparesis following cerebral infarction affecting left non-dominant side ([place]) 11/25/2018   RBBB 10/15/2018   Dysphagia, post-stroke    Microalbuminuria 07/29/2018   Hyperlipidemia LDL goal <70 02/11/2017   Recurrent strokes ([place]) 11/10/2016   Essential hypertension 11/10/2016   Uncontrolled type 2 diabetes mellitus with hyperglycemia, with long-term current use of insulin ([place]) 11/10/2016   Tobacco abuse 08/28/2013   Peripheral neuropathy 08/28/2013    Past Surgical History:  Procedure Laterality Date   ESOPHAGOGASTRODUODENOSCOPY (EGD) WITH PROPOFOL N/A 04/26/2019   Procedure: ESOPHAGOGASTRODUODENOSCOPY (EGD) WITH PROPOFOL;  Surgeon: Lin Landsman, MD;  Location: [facility] ENDOSCOPY;  Service: Gastroenterology;  Laterality: N/A;   LEFT HEART CATH AND CORONARY ANGIOGRAPHY N/A 01/09/2022   Procedure: LEFT  HEART CATH AND CORONARY ANGIOGRAPHY;  Surgeon: Nelva Bush, MD;  Location: [facility];  Service: Cardiovascular;  Laterality: N/A;   NO PAST SURGERIES      Family History  Problem Relation Age of Onset   Hypertension Mother    Stroke Mother        died @ age 62   Hypertension Father    Diabetes Father    Heart attack  Father        died @ 67    Social History   Tobacco Use   Smoking status: Former    Packs/day: 1.00    Years: 38.00    Total pack years: 38.00    Types: Cigarettes    Start date: 1982   Smokeless tobacco: Never  Substance Use Topics   Alcohol use: Not Currently    Alcohol/week: 1.0 standard drink of alcohol    Types: 1 Cans of beer per week    Comment: previously drank but nothing in 1-2 yrs (08/2019).     Current Outpatient Medications:    ACCU-CHEK GUIDE test strip, Check fsbs once daily, Disp: 100 each, Rfl: 2   Accu-Chek Softclix Lancets lancets, SMARTSIG:Topical, Disp: 100 each, Rfl: 1   albuterol (VENTOLIN HFA) 108 (90 Base) MCG/ACT inhaler, Inhale 2 puffs into the lungs every 6 (six) hours as needed for wheezing or shortness of breath., Disp: 8 g, Rfl: 1   amLODipine (NORVASC) 10 MG tablet, TAKE 1 TABLET BY MOUTH DAILY, Disp: 30 tablet, Rfl: 0   apixaban (ELIQUIS) 5 MG TABS tablet, Take 1 tablet (5 mg total) by mouth 2 (two) times daily. Need visit for further refills., Disp: 120 tablet, Rfl: 0   atorvastatin (LIPITOR) 80 MG tablet, TAKE 1 TABLET BY MOUTH EVERY EVENING, Disp: 30 tablet, Rfl: 0   Blood Glucose Monitoring Suppl KIT, Accucheck Guide with lancets and test strips #100 with one refill.  Test blood sugar twice daily. DX Code E11.65 ; Z79.4, Disp: 1 kit, Rfl: 0   Continuous Blood Gluc Sensor (FREESTYLE LIBRE 2 SENSOR) MISC, 1 each by Does not apply route every 14 (fourteen) days., Disp: 6 each, Rfl: 1   dapagliflozin propanediol (FARXIGA) 10 MG TABS tablet, Take 1 tablet (10 mg total) by mouth daily before breakfast. In place of glipizide, Disp: 30 tablet, Rfl: 0   Insulin Pen Needle 32G X 6 MM MISC, Daily, Disp: 100 each, Rfl: 3   iron polysaccharides (NIFEREX) 150 MG capsule, Take 1 capsule (150 mg total) by mouth daily. Can take any form of over-the-counter iron supplement., Disp: , Rfl:    melatonin 1 MG TABS tablet, TAKE 1 TABLET BY MOUTH AT BEDTIME, Disp: 30  tablet, Rfl: 1   metoCLOPramide (REGLAN) 5 MG tablet, TAKE 1 TABLET BY MOUTH 3 TIMES DAILY BEFORE MEALS, Disp: 90 tablet, Rfl: 0   mirtazapine (REMERON) 7.5 MG tablet, Take 1 tablet (7.5 mg total) by mouth at bedtime., Disp: 30 tablet, Rfl: 3   Misc. Devices MISC, One pair of Compression stockings  Hemiplegia and hemiparesis following cerebral infarction affecting left non-dominant side (HCC)  - Primary Codes: F81.829 Lower extremity edema  Codes: R60.0 Type 2 diabetes mellitus with hyperglycemia, with long-term current use of insulin (HCC)  Codes: E11.65, Z79.4, Disp: 1 Units, Rfl: 0   nitroGLYCERIN (NITROSTAT) 0.4 MG SL tablet, Place 1 tablet (0.4 mg total) under the tongue every 5 (five) minutes as needed for chest pain., Disp: 30 tablet, Rfl: 0   omeprazole (PRILOSEC) 40 MG  capsule, TAKE 1 CAPSULE BY MOUTH IN THE MORNING AND AT BEDTIME, Disp: 60 capsule, Rfl: 0   tamsulosin (FLOMAX) 0.4 MG CAPS capsule, Take 1 capsule (0.4 mg total) by mouth daily., Disp: 90 capsule, Rfl: 3   Incontinence Supply Disposable (COMFORT PROTECT ADULT DIAPER/L) MISC, 1 Units by Does not apply route daily. Use 3-4 a day as needed, Disp: 90 each, Rfl: 3  No Known Allergies  I personally reviewed active problem list, medication list, allergies, health maintenance, notes from last encounter, lab results with the patient/caregiver today.   Review of Systems  Eyes:  Negative for blurred vision and double vision.  Musculoskeletal:  Positive for joint pain.  Neurological:  Positive for focal weakness and weakness.      Objective  Vitals:   04/06/22 1601 04/06/22 1606  BP: (!) 158/69 (!) 152/72  Pulse: (!) 49 (!) 49  Temp: 98.6 F (37 C)   TempSrc: Oral   SpO2: 98%   Weight: 155 lb 11.2 oz (70.6 kg)     Body mass index is 25.13 kg/m.  Physical Exam Vitals reviewed.  Constitutional:      General: He is awake.     Appearance: Normal appearance. He is well-developed and normal weight.  HENT:     Head:  Normocephalic and atraumatic.  Eyes:     General: Lids are normal. Gaze aligned appropriately.     Extraocular Movements: Extraocular movements intact.     Conjunctiva/sclera: Conjunctivae normal.     Pupils: Pupils are equal, round, and reactive to light.  Cardiovascular:     Rate and Rhythm: Normal rate and regular rhythm.     Pulses:          Radial pulses are 2+ on the right side and 1+ on the left side.     Heart sounds: Normal heart sounds.  Pulmonary:     Effort: Pulmonary effort is normal.     Breath sounds: Normal breath sounds. No decreased air movement. No decreased breath sounds, wheezing, rhonchi or rales.  Musculoskeletal:     Cervical back: Normal range of motion and neck supple.     Right lower leg: 1+ Edema present.     Left lower leg: 1+ Edema present.  Neurological:     Mental Status: He is alert.     GCS: GCS eye subscore is 4. GCS verbal subscore is 5. GCS motor subscore is 6.     Cranial Nerves: Cranial nerve deficit, dysarthria and facial asymmetry present.     Motor: Atrophy and abnormal muscle tone present.  Psychiatric:        Attention and Perception: Perception normal. He is inattentive.        Mood and Affect: Mood normal. Affect is flat.        Speech: Speech is slurred.        Behavior: Behavior normal. Behavior is cooperative.        Cognition and Memory: Cognition is impaired.      Recent Results (from the past 2160 hour(s))  Troponin I (High Sensitivity)     Status: None   Collection Time: 01/06/22  8:15 PM  Result Value Ref Range   Troponin I (High Sensitivity) 7 <18 ng/L    Comment: (NOTE) Elevated high sensitivity troponin I (hsTnI) values and significant  changes across serial measurements may suggest ACS but many other  chronic and acute conditions are known to elevate hsTnI results.  Refer to the "Links" section for chest pain algorithms  and additional  guidance. Performed at [facility], [address]  [place], [place] [address]   Hepatic function panel     Status: Abnormal   Collection Time: 01/06/22  8:15 PM  Result Value Ref Range   Total Protein 7.1 6.5 - 8.1 g/dL   Albumin 3.4 (L) 3.5 - 5.0 g/dL   AST 41 15 - 41 U/L   ALT 77 (H) 0 - 44 U/L   Alkaline Phosphatase 140 (H) 38 - 126 U/L   Total Bilirubin 0.4 0.3 - 1.2 mg/dL   Bilirubin, Direct <0.1 0.0 - 0.2 mg/dL   Indirect Bilirubin NOT CALCULATED 0.3 - 0.9 mg/dL    Comment: Performed at [facility], [address] [place], [place] [address]  Lactic acid, plasma     Status: None   Collection Time: 01/07/22 12:33 AM  Result Value Ref Range   Lactic Acid, Venous 0.8 0.5 - 1.9 mmol/L    Comment: Performed at [facility], [address] [place], [place] [address]  Protime-INR     Status: None   Collection Time: 01/07/22 12:33 AM  Result Value Ref Range   Prothrombin Time 14.6 11.4 - 15.2 seconds   INR 1.2 0.8 - 1.2    Comment: (NOTE) INR goal varies based on device and disease states. Performed at [facility], [address] [place], [place] [address]   APTT     Status: Abnormal   Collection Time: 01/07/22 12:33 AM  Result Value Ref Range   aPTT 42 (H) 24 - 36 seconds    Comment:        IF BASELINE aPTT IS ELEVATED, SUGGEST PATIENT RISK ASSESSMENT BE USED TO DETERMINE APPROPRIATE ANTICOAGULANT THERAPY. Performed at [facility], [address], [place], [place] [address]   Urine Culture     Status: Abnormal   Collection Time: 01/07/22 12:33 AM   Specimen: In/Out Cath Urine  Result Value Ref Range   Specimen Description      IN/OUT CATH URINE Performed at [facility], [address] [place],  [address]    Special Requests      NONE Performed at [facility], [place], [place] [address]    Culture (A)     30,000 COLONIES/mL STAPHYLOCOCCUS HAEMOLYTICUS 50,000 COLONIES/mL ENTEROCOCCUS FAECALIS    Report Status 01/09/2022 FINAL     Organism ID, Bacteria STAPHYLOCOCCUS HAEMOLYTICUS (A)    Organism ID, Bacteria ENTEROCOCCUS FAECALIS (A)       Susceptibility   Enterococcus faecalis - MIC*    AMPICILLIN <=2 SENSITIVE Sensitive     NITROFURANTOIN <=16 SENSITIVE Sensitive     VANCOMYCIN 1 SENSITIVE Sensitive     * 50,000 COLONIES/mL ENTEROCOCCUS FAECALIS   Staphylococcus haemolyticus - MIC*    CIPROFLOXACIN >=8 RESISTANT Resistant     GENTAMICIN <=0.5 SENSITIVE Sensitive     NITROFURANTOIN <=16 SENSITIVE Sensitive     OXACILLIN >=4 RESISTANT Resistant     TETRACYCLINE <=1 SENSITIVE Sensitive     VANCOMYCIN <=0.5 SENSITIVE Sensitive     TRIMETH/SULFA <=10 SENSITIVE Sensitive     CLINDAMYCIN >=8 RESISTANT Resistant     RIFAMPIN <=0.5 SENSITIVE Sensitive     Inducible Clindamycin NEGATIVE Sensitive     * 30,000 COLONIES/mL STAPHYLOCOCCUS HAEMOLYTICUS  Blood culture (routine single)     Status: None   Collection Time: 01/07/22 12:33 AM   Specimen: BLOOD  Result Value Ref Range   Specimen Description BLOOD RIGHT ARM  Special Requests      BOTTLES DRAWN AEROBIC AND ANAEROBIC Blood Culture adequate volume   Culture      NO GROWTH 5 DAYS Performed at [facility], [address] [place], [place] [address]    Report Status 01/12/2022 FINAL   Urinalysis, Complete w Microscopic     Status: Abnormal   Collection Time: 01/07/22 12:43 AM  Result Value Ref Range   Color, Urine STRAW (A) YELLOW   APPearance HAZY (A) CLEAR   Specific Gravity, Urine 1.005 1.005 - 1.030   pH 5.0 5.0 - 8.0   Glucose, UA >=500 (A) NEGATIVE mg/dL   Hgb urine dipstick SMALL (A) NEGATIVE   Bilirubin Urine NEGATIVE NEGATIVE   Ketones, ur NEGATIVE NEGATIVE mg/dL   Protein, ur 100 (A) NEGATIVE mg/dL   Nitrite NEGATIVE NEGATIVE   Leukocytes,Ua NEGATIVE NEGATIVE   RBC / HPF 0-5 0 - 5 RBC/hpf   WBC, UA 0-5 0 - 5 WBC/hpf   Bacteria, UA RARE (A) NONE SEEN   Squamous Epithelial / LPF 0-5 0 - 5   Mucus PRESENT     Comment: Performed at  [facility], [address] [place], [place] [address]  CBG monitoring, ED     Status: Abnormal   Collection Time: 01/07/22  1:23 AM  Result Value Ref Range   Glucose-Capillary 103 (H) 70 - 99 mg/dL    Comment: Glucose reference range applies only to samples taken after fasting for at least 8 hours.  Troponin I (High Sensitivity)     Status: None   Collection Time: 01/07/22  3:20 AM  Result Value Ref Range   Troponin I (High Sensitivity) 8 <18 ng/L    Comment: (NOTE) Elevated high sensitivity troponin I (hsTnI) values and significant  changes across serial measurements may suggest ACS but many other  chronic and acute conditions are known to elevate hsTnI results.  Refer to the "Links" section for chest pain algorithms and additional  guidance. Performed at [facility], [address] [place], [place] [address]   Culture, blood (single)     Status: None   Collection Time: 01/07/22  3:21 AM   Specimen: BLOOD  Result Value Ref Range   Specimen Description BLOOD RIGHT FA    Special Requests      BOTTLES DRAWN AEROBIC AND ANAEROBIC Blood Culture results may not be optimal due to an excessive volume of blood received in culture bottles   Culture      NO GROWTH 5 DAYS Performed at [facility], [address] [place], [place] [address]    Report Status 01/12/2022 FINAL   Lactic acid, plasma     Status: None   Collection Time: 01/07/22  3:21 AM  Result Value Ref Range   Lactic Acid, Venous 1.4 0.5 - 1.9 mmol/L    Comment: Performed at [facility], [address] [place], [place] [address]  Thyroid Panel With TSH     Status: None   Collection Time: 01/07/22  3:21 AM  Result Value Ref Range   TSH 1.870 0.450 - 4.500 uIU/mL   T4, Total 7.8 4.5 - 12.0 ug/dL   T3 Uptake Ratio 30 24 - 39 %   Free Thyroxine Index 2.3 1.2 - 4.9    Comment: (NOTE) Performed At: [facility] [place], [place] [id number] Rush Farmer  MD [id number]   Basic metabolic panel     Status: Abnormal   Collection Time: 01/07/22  3:21  AM  Result Value Ref Range   Sodium 141 135 - 145 mmol/L   Potassium 3.9 3.5 - 5.1 mmol/L   Chloride 113 (H) 98 - 111 mmol/L   CO2 23 22 - 32 mmol/L   Glucose, Bld 97 70 - 99 mg/dL    Comment: Glucose reference range applies only to samples taken after fasting for at least 8 hours.   BUN 25 (H) 8 - 23 mg/dL   Creatinine, Ser 1.57 (H) 0.61 - 1.24 mg/dL   Calcium 8.5 (L) 8.9 - 10.3 mg/dL   GFR, Estimated 49 (L) >60 mL/min    Comment: (NOTE) Calculated using the CKD-EPI Creatinine Equation (2021)    Anion gap 5 5 - 15    Comment: Performed at [facility], [address] [place], [place] [address]  CBC     Status: Abnormal   Collection Time: 01/07/22  3:21 AM  Result Value Ref Range   WBC 3.7 (L) 4.0 - 10.5 K/uL   RBC 2.87 (L) 4.22 - 5.81 MIL/uL   Hemoglobin 7.9 (L) 13.0 - 17.0 g/dL   HCT 25.3 (L) 39.0 - 52.0 %   MCV 88.2 80.0 - 100.0 fL   MCH 27.5 26.0 - 34.0 pg   MCHC 31.2 30.0 - 36.0 g/dL   RDW 17.1 (H) 11.5 - 15.5 %   Platelets 181 150 - 400 K/uL   nRBC 0.5 (H) 0.0 - 0.2 %    Comment: Performed at [facility], [address] [place], [place] [address]  Lipid panel     Status: Abnormal   Collection Time: 01/07/22  3:21 AM  Result Value Ref Range   Cholesterol 130 0 - 200 mg/dL   Triglycerides 181 (H) <150 mg/dL   HDL 59 >40 mg/dL   Total CHOL/HDL Ratio 2.2 RATIO   VLDL 36 0 - 40 mg/dL   LDL Cholesterol 35 0 - 99 mg/dL    Comment:        Total Cholesterol/HDL:CHD Risk Coronary Heart Disease Risk Table                     Men   Women  1/2 Average Risk   3.4   3.3  Average Risk       5.0   4.4  2 X Average Risk   9.6   7.1  3 X Average Risk  23.4   11.0        Use the calculated Patient Ratio above and the CHD Risk Table to determine the patient's CHD Risk.        ATP III CLASSIFICATION (LDL):  <100     mg/dL   Optimal  100-129  mg/dL   Near or  Above                    Optimal  130-159  mg/dL   Borderline  160-189  mg/dL   High  >190     mg/dL   Very High Performed at [facility], [address] [place], [place] [address]   Glucose, capillary     Status: None   Collection Time: 01/07/22  4:50 AM  Result Value Ref Range   Glucose-Capillary 91 70 - 99 mg/dL    Comment: Glucose reference range applies only to samples taken after fasting for at least 8 hours.  MRSA Next Gen by PCR, Nasal     Status: None   Collection Time: 01/07/22  4:59 AM  Specimen: Nasal Mucosa; Nasal Swab  Result Value Ref Range   MRSA by PCR Next Gen NOT DETECTED NOT DETECTED    Comment: (NOTE) The GeneXpert MRSA Assay (FDA approved for NASAL specimens only), is one component of a comprehensive MRSA colonization surveillance program. It is not intended to diagnose MRSA infection nor to guide or monitor treatment for MRSA infections. Test performance is not FDA approved in patients less than 74 years old. Performed at [facility], [address] [place], [place] [address]   Glucose, capillary     Status: None   Collection Time: 01/07/22  7:22 AM  Result Value Ref Range   Glucose-Capillary 81 70 - 99 mg/dL    Comment: Glucose reference range applies only to samples taken after fasting for at least 8 hours.  Glucose, capillary     Status: None   Collection Time: 01/07/22 11:15 AM  Result Value Ref Range   Glucose-Capillary 73 70 - 99 mg/dL    Comment: Glucose reference range applies only to samples taken after fasting for at least 8 hours.  Glucose, capillary     Status: None   Collection Time: 01/07/22  3:35 PM  Result Value Ref Range   Glucose-Capillary 84 70 - 99 mg/dL    Comment: Glucose reference range applies only to samples taken after fasting for at least 8 hours.  Glucose, capillary     Status: Abnormal   Collection Time: 01/07/22  7:22 PM  Result Value Ref Range   Glucose-Capillary 161 (H) 70 - 99 mg/dL     Comment: Glucose reference range applies only to samples taken after fasting for at least 8 hours.  Glucose, capillary     Status: Abnormal   Collection Time: 01/07/22 11:24 PM  Result Value Ref Range   Glucose-Capillary 66 (L) 70 - 99 mg/dL    Comment: Glucose reference range applies only to samples taken after fasting for at least 8 hours.  Glucose, capillary     Status: None   Collection Time: 01/08/22  3:34 AM  Result Value Ref Range   Glucose-Capillary 71 70 - 99 mg/dL    Comment: Glucose reference range applies only to samples taken after fasting for at least 8 hours.  Basic metabolic panel     Status: Abnormal   Collection Time: 01/08/22  4:30 AM  Result Value Ref Range   Sodium 145 135 - 145 mmol/L   Potassium 3.6 3.5 - 5.1 mmol/L   Chloride 115 (H) 98 - 111 mmol/L   CO2 23 22 - 32 mmol/L   Glucose, Bld 70 70 - 99 mg/dL    Comment: Glucose reference range applies only to samples taken after fasting for at least 8 hours.   BUN 21 8 - 23 mg/dL   Creatinine, Ser 1.83 (H) 0.61 - 1.24 mg/dL   Calcium 8.7 (L) 8.9 - 10.3 mg/dL   GFR, Estimated 41 (L) >60 mL/min    Comment: (NOTE) Calculated using the CKD-EPI Creatinine Equation (2021)    Anion gap 7 5 - 15    Comment: Performed at [facility], [address] [place], [place] [address]  CBC     Status: Abnormal   Collection Time: 01/08/22  4:30 AM  Result Value Ref Range   WBC 4.1 4.0 - 10.5 K/uL   RBC 3.18 (L) 4.22 - 5.81 MIL/uL   Hemoglobin 8.7 (L) 13.0 - 17.0 g/dL   HCT 28.3 (L) 39.0 - 52.0 %   MCV 89.0  80.0 - 100.0 fL   MCH 27.4 26.0 - 34.0 pg   MCHC 30.7 30.0 - 36.0 g/dL   RDW 17.6 (H) 11.5 - 15.5 %   Platelets 206 150 - 400 K/uL   nRBC 0.7 (H) 0.0 - 0.2 %    Comment: Performed at [facility], [address] [place], [place] [address]  Magnesium     Status: None   Collection Time: 01/08/22  4:30 AM  Result Value Ref Range   Magnesium 2.0 1.7 - 2.4 mg/dL    Comment: Performed at [facility], [place], [place] [address]  Iron and TIBC     Status: Abnormal   Collection Time: 01/08/22  4:38 AM  Result Value Ref Range   Iron 42 (L) 45 - 182 ug/dL   TIBC 291 250 - 450 ug/dL   Saturation Ratios 14 (L) 17.9 - 39.5 %   UIBC 249 ug/dL    Comment: Performed at [facility], [address] [place], [place] [address]  Folate     Status: None   Collection Time: 01/08/22  4:38 AM  Result Value Ref Range   Folate 14.1 >5.9 ng/mL    Comment: Performed at [facility], [place], [place] [address]  Procalcitonin - Baseline     Status: None   Collection Time: 01/08/22  4:38 AM  Result Value Ref Range   Procalcitonin 0.13 ng/mL    Comment:        Interpretation: PCT (Procalcitonin) <= 0.5 ng/mL: Systemic infection (sepsis) is not likely. Local bacterial infection is possible. (NOTE)       Sepsis PCT Algorithm           Lower Respiratory Tract                                      Infection PCT Algorithm    ----------------------------     ----------------------------         PCT < 0.25 ng/mL                PCT < 0.10 ng/mL          Strongly encourage             Strongly discourage   discontinuation of antibiotics    initiation of antibiotics    ----------------------------     -----------------------------       PCT 0.25 - 0.50 ng/mL            PCT 0.10 - 0.25 ng/mL               OR       >80% decrease in PCT            Discourage initiation of                                            antibiotics      Encourage discontinuation           of antibiotics    ----------------------------     -----------------------------         PCT >= 0.50 ng/mL              PCT 0.26 - 0.50 ng/mL  AND        <80% decrease in PCT             Encourage initiation of                                             antibiotics       Encourage continuation           of antibiotics    ----------------------------      -----------------------------        PCT >= 0.50 ng/mL                  PCT > 0.50 ng/mL               AND         increase in PCT                  Strongly encourage                                      initiation of antibiotics    Strongly encourage escalation           of antibiotics                                     -----------------------------                                           PCT <= 0.25 ng/mL                                                 OR                                        > 80% decrease in PCT                                      Discontinue / Do not initiate                                             antibiotics  Performed at [facility], [address] [place], [place] [address]   APTT     Status: Abnormal   Collection Time: 01/08/22  4:05 PM  Result Value Ref Range   aPTT >200 (HH) 24 - 36 seconds    Comment:        IF BASELINE aPTT IS ELEVATED, SUGGEST PATIENT RISK ASSESSMENT BE USED TO DETERMINE APPROPRIATE ANTICOAGULANT THERAPY. REPEATED TO VERIFY CRITICAL RESULT CALLED TO, READ BACK BY AND VERIFIED WITH: ASHLEY CRADDOCK 01/08/22 1709 KLW Performed at [facility], [place], [place]  [address]   Vitamin B12     Status: None   Collection Time: 01/08/22  4:05 PM  Result Value Ref Range   Vitamin B-12 396 180 - 914 pg/mL    Comment: (NOTE) This assay is not validated for testing neonatal or myeloproliferative syndrome specimens for Vitamin B12 levels. Performed at [facility], [place] [address] [place], [place] [address]   Basic metabolic panel     Status: Abnormal   Collection Time: 01/09/22  1:23 AM  Result Value Ref Range   Sodium 144 135 - 145 mmol/L   Potassium 3.6 3.5 - 5.1 mmol/L   Chloride 116 (H) 98 - 111 mmol/L   CO2 24 22 - 32 mmol/L   Glucose, Bld 126 (H) 70 - 99 mg/dL    Comment: Glucose reference range applies only to samples taken after fasting for at least 8 hours.   BUN 22 8 - 23  mg/dL   Creatinine, Ser 1.81 (H) 0.61 - 1.24 mg/dL   Calcium 8.7 (L) 8.9 - 10.3 mg/dL   GFR, Estimated 41 (L) >60 mL/min    Comment: (NOTE) Calculated using the CKD-EPI Creatinine Equation (2021)    Anion gap 4 (L) 5 - 15    Comment: Performed at [facility], [address] [place], [place] [address]  CBC     Status: Abnormal   Collection Time: 01/09/22  1:23 AM  Result Value Ref Range   WBC 4.3 4.0 - 10.5 K/uL   RBC 2.96 (L) 4.22 - 5.81 MIL/uL   Hemoglobin 8.3 (L) 13.0 - 17.0 g/dL   HCT 26.6 (L) 39.0 - 52.0 %   MCV 89.9 80.0 - 100.0 fL   MCH 28.0 26.0 - 34.0 pg   MCHC 31.2 30.0 - 36.0 g/dL   RDW 17.8 (H) 11.5 - 15.5 %   Platelets 163 150 - 400 K/uL   nRBC 0.0 0.0 - 0.2 %    Comment: Performed at [facility], [address], [place], [place] [address]  Magnesium     Status: None   Collection Time: 01/09/22  1:23 AM  Result Value Ref Range   Magnesium 2.1 1.7 - 2.4 mg/dL    Comment: Performed at [facility], [address] [place], [place] [address]  APTT     Status: Abnormal   Collection Time: 01/09/22  1:23 AM  Result Value Ref Range   aPTT 189 (HH) 24 - 36 seconds    Comment:        IF BASELINE aPTT IS ELEVATED, SUGGEST PATIENT RISK ASSESSMENT BE USED TO DETERMINE APPROPRIATE ANTICOAGULANT THERAPY. CRITICAL RESULT CALLED TO, READ BACK BY AND VERIFIED WITH: BETH BUEONO _0  ON 01/09/22 SKL Performed at [facility] [place], [place] [address]   Heparin level (unfractionated)     Status: Abnormal   Collection Time: 01/09/22  1:23 AM  Result Value Ref Range   Heparin Unfractionated >1.10 (H) 0.30 - 0.70 IU/mL    Comment: (NOTE) The clinical reportable range upper limit is being lowered to >1.10 to align with the FDA approved guidance for the current laboratory assay.  If heparin results are below expected values, and patient dosage has  been confirmed, suggest follow up testing of antithrombin III  levels. Performed at [facility], [place], [place] [address]   Heparin level (unfractionated)     Status: Abnormal   Collection Time: 01/09/22  9:54 AM  Result Value Ref Range  Heparin Unfractionated >1.10 (H) 0.30 - 0.70 IU/mL    Comment: (NOTE) The clinical reportable range upper limit is being lowered to >1.10 to align with the FDA approved guidance for the current laboratory assay.  If heparin results are below expected values, and patient dosage has  been confirmed, suggest follow up testing of antithrombin III levels. Performed at [facility], [address] [place], [place] [address]   APTT     Status: Abnormal   Collection Time: 01/09/22  9:54 AM  Result Value Ref Range   aPTT 106 (H) 24 - 36 seconds    Comment:        IF BASELINE aPTT IS ELEVATED, SUGGEST PATIENT RISK ASSESSMENT BE USED TO DETERMINE APPROPRIATE ANTICOAGULANT THERAPY. Performed at [facility], [address] [place], [place] [address]   Basic metabolic panel     Status: Abnormal   Collection Time: 01/10/22  1:11 AM  Result Value Ref Range   Sodium 145 135 - 145 mmol/L   Potassium 3.5 3.5 - 5.1 mmol/L   Chloride 112 (H) 98 - 111 mmol/L   CO2 23 22 - 32 mmol/L   Glucose, Bld 77 70 - 99 mg/dL    Comment: Glucose reference range applies only to samples taken after fasting for at least 8 hours.   BUN 19 8 - 23 mg/dL   Creatinine, Ser 1.66 (H) 0.61 - 1.24 mg/dL   Calcium 9.3 8.9 - 10.3 mg/dL   GFR, Estimated 46 (L) >60 mL/min    Comment: (NOTE) Calculated using the CKD-EPI Creatinine Equation (2021)    Anion gap 10 5 - 15    Comment: Performed at [facility], [address] [place], [place] [address]  CBC     Status: Abnormal   Collection Time: 01/10/22  1:11 AM  Result Value Ref Range   WBC 4.6 4.0 - 10.5 K/uL   RBC 3.42 (L) 4.22 - 5.81 MIL/uL   Hemoglobin 9.2 (L) 13.0 - 17.0 g/dL   HCT 30.5 (L) 39.0 - 52.0 %   MCV 89.2 80.0 -  100.0 fL   MCH 26.9 26.0 - 34.0 pg   MCHC 30.2 30.0 - 36.0 g/dL   RDW 17.7 (H) 11.5 - 15.5 %   Platelets 207 150 - 400 K/uL   nRBC 0.0 0.0 - 0.2 %    Comment: Performed at [facility], [address], [place], [place] [address]  Magnesium     Status: None   Collection Time: 01/10/22  1:11 AM  Result Value Ref Range   Magnesium 2.1 1.7 - 2.4 mg/dL    Comment: Performed at [facility], [address] [place], [place] [address]  Lipoprotein A (LPA)     Status: Abnormal   Collection Time: 01/10/22  1:11 AM  Result Value Ref Range   Lipoprotein (a) 313.8 (H) <75.0 nmol/L    Comment: (NOTE) **Results verified by repeat testing** Note:  Values greater than or equal to 75.0 nmol/L may       indicate an independent risk factor for CHD,       but must be evaluated with caution when applied       to non-Caucasian populations due to the       influence of genetic factors on Lp(a) across       ethnicities. Performed At: [facility] [place], [place] [id number] Rush Farmer MD VC:[id number]   APTT     Status: Abnormal  Collection Time: 01/10/22  1:11 AM  Result Value Ref Range   aPTT 76 (H) 24 - 36 seconds    Comment:        IF BASELINE aPTT IS ELEVATED, SUGGEST PATIENT RISK ASSESSMENT BE USED TO DETERMINE APPROPRIATE ANTICOAGULANT THERAPY. Performed at [facility], [place], [place] [address]   Heparin level (unfractionated)     Status: Abnormal   Collection Time: 01/10/22  1:11 AM  Result Value Ref Range   Heparin Unfractionated 0.86 (H) 0.30 - 0.70 IU/mL    Comment: (NOTE) The clinical reportable range upper limit is being lowered to >1.10 to align with the FDA approved guidance for the current laboratory assay.  If heparin results are below expected values, and patient dosage has  been confirmed, suggest follow up testing of antithrombin III levels. Performed at [facility] [place] [facility], [address] [place], [place] [address]   Glucose, capillary     Status: None   Collection Time: 01/10/22  2:02 AM  Result Value Ref Range   Glucose-Capillary 81 70 - 99 mg/dL    Comment: Glucose reference range applies only to samples taken after fasting for at least 8 hours.  APTT     Status: Abnormal   Collection Time: 01/10/22  6:53 AM  Result Value Ref Range   aPTT 64 (H) 24 - 36 seconds    Comment:        IF BASELINE aPTT IS ELEVATED, SUGGEST PATIENT RISK ASSESSMENT BE USED TO DETERMINE APPROPRIATE ANTICOAGULANT THERAPY. Performed at [facility], [address] [place], [place] [address]   Bladder Scan (Post Void Residual) in office     Status: None   Collection Time: 02/25/22  9:07 AM  Result Value Ref Range   Scan Result 221m      PHQ2/9:    04/06/2022    4:04 PM 01/13/2022    3:50 PM 11/12/2021    3:41 PM 06/02/2021    2:06 PM 03/20/2021    3:52 PM  Depression screen PHQ 2/9  Decreased Interest 3 3 0 0 0  Down, Depressed, Hopeless 0 0 0 0   PHQ - 2 Score 3 3 0 0 0  Altered sleeping 0 3 0    Tired, decreased energy 3 3 0    Change in appetite 2 0 0    Feeling bad or failure about yourself  0 0 0    Trouble concentrating 0 0 0    Moving slowly or fidgety/restless 3 0 0    Suicidal thoughts 0 0 0    PHQ-9 Score 11 9 0    Difficult doing work/chores Somewhat difficult  Not difficult at all        Fall Risk:    04/06/2022    4:04 PM 01/13/2022    3:50 PM 11/12/2021    3:40 PM 06/20/2021   11:38 AM 06/02/2021    2:05 PM  Fall Risk   Falls in the past year? 0 0 1 0 0  Number falls in past yr: 0 0 0 0 0  Injury with Fall? 0 0 0 0 0  Risk for fall due to : History of fall(s);Impaired balance/gait;Impaired mobility Impaired balance/gait;Impaired mobility Impaired mobility;Impaired balance/gait Impaired mobility;Impaired balance/gait No Fall Risks  Follow up Falls evaluation completed Falls evaluation completed Falls evaluation completed  Falls evaluation completed       Functional Status Survey:      Assessment & Plan  Problem List  Items Addressed This Visit       Cardiovascular and Mediastinum   Recurrent strokes ([place])    Unsure of chronicity Appears to be recurrent problem likely due to A. Fib Patient is currently taking Apixaban and appears to be tolerating well Continue this per Cardiology recommendations Will place referral to Neurology, PT and OT per caregiver and patient request Follow up as needed       Relevant Medications   Incontinence Supply Disposable (COMFORT PROTECT ADULT DIAPER/L) MISC   Other Relevant Orders   Ambulatory referral to Neurology     Nervous and Auditory   Hemiplegia and hemiparesis following cerebral infarction affecting left non-dominant side (HCC) - Primary    Chronic, ongoing Patient has reduced function of left side and abnormal tone of left arm Patient admits to intact sensation in left arm and left leg Will place referral to PT and OT to assist with ADLs and recovery of function as tolerated.  Follow up as needed.       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   I, Renell Allum E Shoichi Mielke, PA-C, have reviewed all documentation for this visit. The documentation on 04/06/22 for the exam, diagnosis, procedures, and orders are all accurate and complete.   Talitha Givens, MHS, PA-C [facility]

## 2022-04-06 NOTE — Assessment & Plan Note (Signed)
Unsure of chronicity Appears to be recurrent problem likely due to A. Fib Patient is currently taking Apixaban and appears to be tolerating well Continue this per Cardiology recommendations Will place referral to Neurology, PT and OT per caregiver and patient request Follow up as needed

## 2022-04-16 ENCOUNTER — Ambulatory Visit: Payer: Medicaid Other | Admitting: Podiatry

## 2022-04-21 ENCOUNTER — Observation Stay: Payer: Medicare Other

## 2022-04-21 ENCOUNTER — Other Ambulatory Visit: Payer: Self-pay

## 2022-04-21 ENCOUNTER — Emergency Department: Payer: Medicare Other

## 2022-04-21 ENCOUNTER — Observation Stay (HOSPITAL_COMMUNITY)
Admit: 2022-04-21 | Discharge: 2022-04-21 | Disposition: A | Discharge status: Home / Self Care | Payer: Medicare Other | Attending: Internal Medicine | Admitting: Internal Medicine

## 2022-04-21 ENCOUNTER — Inpatient Hospital Stay
Admission: EM | Admit: 2022-04-21 | Discharge: 2022-04-23 | DRG: 065 | Disposition: A | Discharge status: Home Health | Payer: Medicare Other | Attending: Internal Medicine | Admitting: Internal Medicine

## 2022-04-21 DIAGNOSIS — I4510 Unspecified right bundle-branch block: Secondary | ICD-10-CM | POA: Diagnosis not present

## 2022-04-21 DIAGNOSIS — E11649 Type 2 diabetes mellitus with hypoglycemia without coma: Secondary | ICD-10-CM | POA: Diagnosis not present

## 2022-04-21 DIAGNOSIS — Z833 Family history of diabetes mellitus: Secondary | ICD-10-CM

## 2022-04-21 DIAGNOSIS — J9811 Atelectasis: Secondary | ICD-10-CM | POA: Diagnosis not present

## 2022-04-21 DIAGNOSIS — I6302 Cerebral infarction due to thrombosis of basilar artery: Secondary | ICD-10-CM | POA: Diagnosis not present

## 2022-04-21 DIAGNOSIS — R471 Dysarthria and anarthria: Secondary | ICD-10-CM | POA: Diagnosis not present

## 2022-04-21 DIAGNOSIS — Z8249 Family history of ischemic heart disease and other diseases of the circulatory system: Secondary | ICD-10-CM

## 2022-04-21 DIAGNOSIS — G936 Cerebral edema: Secondary | ICD-10-CM | POA: Diagnosis not present

## 2022-04-21 DIAGNOSIS — E1122 Type 2 diabetes mellitus with diabetic chronic kidney disease: Secondary | ICD-10-CM | POA: Diagnosis not present

## 2022-04-21 DIAGNOSIS — I6602 Occlusion and stenosis of left middle cerebral artery: Secondary | ICD-10-CM | POA: Diagnosis not present

## 2022-04-21 DIAGNOSIS — Z7901 Long term (current) use of anticoagulants: Secondary | ICD-10-CM | POA: Diagnosis not present

## 2022-04-21 DIAGNOSIS — I69354 Hemiplegia and hemiparesis following cerebral infarction affecting left non-dominant side: Secondary | ICD-10-CM | POA: Diagnosis not present

## 2022-04-21 DIAGNOSIS — Z20822 Contact with and (suspected) exposure to covid-19: Secondary | ICD-10-CM | POA: Diagnosis not present

## 2022-04-21 DIAGNOSIS — I480 Paroxysmal atrial fibrillation: Secondary | ICD-10-CM | POA: Diagnosis present

## 2022-04-21 DIAGNOSIS — I130 Hypertensive heart and chronic kidney disease with heart failure and stage 1 through stage 4 chronic kidney disease, or unspecified chronic kidney disease: Secondary | ICD-10-CM | POA: Diagnosis not present

## 2022-04-21 DIAGNOSIS — E119 Type 2 diabetes mellitus without complications: Secondary | ICD-10-CM | POA: Diagnosis not present

## 2022-04-21 DIAGNOSIS — E1151 Type 2 diabetes mellitus with diabetic peripheral angiopathy without gangrene: Secondary | ICD-10-CM | POA: Diagnosis not present

## 2022-04-21 DIAGNOSIS — E782 Mixed hyperlipidemia: Secondary | ICD-10-CM | POA: Diagnosis not present

## 2022-04-21 DIAGNOSIS — N1831 Chronic kidney disease, stage 3a: Secondary | ICD-10-CM | POA: Diagnosis not present

## 2022-04-21 DIAGNOSIS — I6389 Other cerebral infarction: Secondary | ICD-10-CM

## 2022-04-21 DIAGNOSIS — I10 Essential (primary) hypertension: Secondary | ICD-10-CM | POA: Diagnosis not present

## 2022-04-21 DIAGNOSIS — R4781 Slurred speech: Secondary | ICD-10-CM | POA: Diagnosis not present

## 2022-04-21 DIAGNOSIS — J3489 Other specified disorders of nose and nasal sinuses: Secondary | ICD-10-CM | POA: Diagnosis not present

## 2022-04-21 DIAGNOSIS — Z794 Long term (current) use of insulin: Secondary | ICD-10-CM

## 2022-04-21 DIAGNOSIS — G8191 Hemiplegia, unspecified affecting right dominant side: Secondary | ICD-10-CM | POA: Diagnosis present

## 2022-04-21 DIAGNOSIS — I3139 Other pericardial effusion (noninflammatory): Secondary | ICD-10-CM | POA: Diagnosis present

## 2022-04-21 DIAGNOSIS — I639 Cerebral infarction, unspecified: Secondary | ICD-10-CM | POA: Diagnosis not present

## 2022-04-21 DIAGNOSIS — R29709 NIHSS score 9: Secondary | ICD-10-CM | POA: Diagnosis present

## 2022-04-21 DIAGNOSIS — F321 Major depressive disorder, single episode, moderate: Secondary | ICD-10-CM | POA: Diagnosis present

## 2022-04-21 DIAGNOSIS — E1165 Type 2 diabetes mellitus with hyperglycemia: Secondary | ICD-10-CM | POA: Diagnosis not present

## 2022-04-21 DIAGNOSIS — R197 Diarrhea, unspecified: Secondary | ICD-10-CM | POA: Diagnosis not present

## 2022-04-21 DIAGNOSIS — E162 Hypoglycemia, unspecified: Secondary | ICD-10-CM | POA: Diagnosis not present

## 2022-04-21 DIAGNOSIS — Z79899 Other long term (current) drug therapy: Secondary | ICD-10-CM

## 2022-04-21 DIAGNOSIS — R531 Weakness: Secondary | ICD-10-CM | POA: Diagnosis not present

## 2022-04-21 DIAGNOSIS — I5032 Chronic diastolic (congestive) heart failure: Secondary | ICD-10-CM | POA: Diagnosis present

## 2022-04-21 DIAGNOSIS — I672 Cerebral atherosclerosis: Secondary | ICD-10-CM | POA: Diagnosis present

## 2022-04-21 DIAGNOSIS — Z823 Family history of stroke: Secondary | ICD-10-CM

## 2022-04-21 DIAGNOSIS — I63233 Cerebral infarction due to unspecified occlusion or stenosis of bilateral carotid arteries: Secondary | ICD-10-CM | POA: Diagnosis not present

## 2022-04-21 DIAGNOSIS — I340 Nonrheumatic mitral (valve) insufficiency: Secondary | ICD-10-CM | POA: Diagnosis not present

## 2022-04-21 DIAGNOSIS — Z87891 Personal history of nicotine dependence: Secondary | ICD-10-CM

## 2022-04-21 DIAGNOSIS — N183 Chronic kidney disease, stage 3 unspecified: Secondary | ICD-10-CM | POA: Diagnosis present

## 2022-04-21 DIAGNOSIS — Z8673 Personal history of transient ischemic attack (TIA), and cerebral infarction without residual deficits: Secondary | ICD-10-CM | POA: Diagnosis not present

## 2022-04-21 DIAGNOSIS — E7800 Pure hypercholesterolemia, unspecified: Secondary | ICD-10-CM | POA: Diagnosis not present

## 2022-04-21 LAB — COMPREHENSIVE METABOLIC PANEL
ALT: 47 U/L — ABNORMAL HIGH (ref 0–44)
AST: 29 U/L (ref 15–41)
Albumin: 3.7 g/dL (ref 3.5–5.0)
Alkaline Phosphatase: 164 U/L — ABNORMAL HIGH (ref 38–126)
Anion gap: 7 (ref 5–15)
BUN: 27 mg/dL — ABNORMAL HIGH (ref 8–23)
CO2: 21 mmol/L — ABNORMAL LOW (ref 22–32)
Calcium: 9.3 mg/dL (ref 8.9–10.3)
Chloride: 113 mmol/L — ABNORMAL HIGH (ref 98–111)
Creatinine, Ser: 1.82 mg/dL — ABNORMAL HIGH (ref 0.61–1.24)
GFR, Estimated: 41 mL/min — ABNORMAL LOW (ref >60)
Glucose, Bld: 114 mg/dL — ABNORMAL HIGH (ref 70–99)
Potassium: 3.8 mmol/L (ref 3.5–5.1)
Sodium: 141 mmol/L (ref 135–145)
Total Bilirubin: 0.7 mg/dL (ref 0.3–1.2)
Total Protein: 8.1 g/dL (ref 6.5–8.1)

## 2022-04-21 LAB — ECHOCARDIOGRAM COMPLETE
AR max vel: 1.94 cm2
AV Area VTI: 1.98 cm2
AV Area mean vel: 1.85 cm2
AV Mean grad: 3 mmHg
AV Peak grad: 4.4 mmHg
Ao pk vel: 1.05 m/s
Area-P 1/2: 3.31 cm2
Height: 65 in
S' Lateral: 2.58 cm
Weight: 2296.31 oz

## 2022-04-21 LAB — CBC
HCT: 33.5 % — ABNORMAL LOW (ref 39.0–52.0)
Hemoglobin: 10.6 g/dL — ABNORMAL LOW (ref 13.0–17.0)
MCH: 26.5 pg (ref 26.0–34.0)
MCHC: 31.6 g/dL (ref 30.0–36.0)
MCV: 83.8 fL (ref 80.0–100.0)
Platelets: 267 10*3/uL (ref 150–400)
RBC: 4 MIL/uL — ABNORMAL LOW (ref 4.22–5.81)
RDW: 19 % — ABNORMAL HIGH (ref 11.5–15.5)
WBC: 5.6 10*3/uL (ref 4.0–10.5)
nRBC: 0 % (ref 0.0–0.2)

## 2022-04-21 LAB — PROTIME-INR
INR: 1.1 (ref 0.8–1.2)
Prothrombin Time: 13.6 seconds (ref 11.4–15.2)

## 2022-04-21 LAB — SARS CORONAVIRUS 2 BY RT PCR: SARS Coronavirus 2 by RT PCR: NEGATIVE

## 2022-04-21 LAB — URINALYSIS, ROUTINE W REFLEX MICROSCOPIC
Bilirubin Urine: NEGATIVE
Glucose, UA: >=500 mg/dL — AB
Ketones, ur: NEGATIVE mg/dL
Leukocytes,Ua: NEGATIVE
Nitrite: NEGATIVE
Protein, ur: >=300 mg/dL — AB
Specific Gravity, Urine: 1.014 (ref 1.005–1.030)
pH: 5 (ref 5.0–8.0)

## 2022-04-21 LAB — DIFFERENTIAL
Abs Immature Granulocytes: 0.01 10*3/uL (ref 0.00–0.07)
Basophils Absolute: 0 10*3/uL (ref 0.0–0.1)
Basophils Relative: 1 %
Eosinophils Absolute: 0.1 10*3/uL (ref 0.0–0.5)
Eosinophils Relative: 1 %
Immature Granulocytes: 0 %
Lymphocytes Relative: 35 %
Lymphs Abs: 2 10*3/uL (ref 0.7–4.0)
Monocytes Absolute: 0.5 10*3/uL (ref 0.1–1.0)
Monocytes Relative: 9 %
Neutro Abs: 3 10*3/uL (ref 1.7–7.7)
Neutrophils Relative %: 54 %

## 2022-04-21 LAB — GLUCOSE, CAPILLARY
Glucose-Capillary: 94 mg/dL (ref 70–99)
Glucose-Capillary: 83 mg/dL (ref 70–99)

## 2022-04-21 LAB — APTT: aPTT: 37 seconds — ABNORMAL HIGH (ref 24–36)

## 2022-04-21 LAB — TROPONIN I (HIGH SENSITIVITY)
Troponin I (High Sensitivity): 10 ng/L (ref <18)
Troponin I (High Sensitivity): 9 ng/L (ref <18)

## 2022-04-21 LAB — HEMOGLOBIN A1C
Hgb A1c MFr Bld: 7 % — ABNORMAL HIGH (ref 4.8–5.6)
Mean Plasma Glucose: 154.2 mg/dL

## 2022-04-21 MED ORDER — TAMSULOSIN HCL 0.4 MG PO CAPS
0.4000 mg | ORAL_CAPSULE | Freq: Every evening | ORAL | Status: DC
  Administered 2022-04-22: 0.4 mg via ORAL
  Filled 2022-04-21: qty 1

## 2022-04-21 MED ORDER — HYDRALAZINE HCL 20 MG/ML IJ SOLN: 10.0000 mg | Freq: Four times a day (QID) | INTRAMUSCULAR | Status: DC | PRN

## 2022-04-21 MED ORDER — SODIUM CHLORIDE 0.9 % IV SOLN: INTRAVENOUS | Status: AC

## 2022-04-21 MED ORDER — LABETALOL HCL 5 MG/ML IV SOLN: 10.0000 mg | Freq: Four times a day (QID) | INTRAVENOUS | Status: DC | PRN

## 2022-04-21 MED ORDER — STROKE: EARLY STAGES OF RECOVERY BOOK: Freq: Once | Status: AC

## 2022-04-21 MED ORDER — METOCLOPRAMIDE HCL 5 MG PO TABS
5.0000 mg | ORAL_TABLET | Freq: Three times a day (TID) | ORAL | Status: DC
  Administered 2022-04-22 – 2022-04-23 (×3): 5 mg via ORAL
  Filled 2022-04-21 (×7): qty 1

## 2022-04-21 MED ORDER — NITROGLYCERIN 0.4 MG SL SUBL: 0.4000 mg | SUBLINGUAL_TABLET | SUBLINGUAL | Status: DC | PRN

## 2022-04-21 MED ORDER — HYDRALAZINE HCL 20 MG/ML IJ SOLN
10.0000 mg | Freq: Four times a day (QID) | INTRAMUSCULAR | Status: DC | PRN
  Administered 2022-04-21: 10 mg via INTRAVENOUS
  Filled 2022-04-21: qty 1

## 2022-04-21 MED ORDER — ACETAMINOPHEN 325 MG PO TABS: 650.0000 mg | ORAL_TABLET | ORAL | Status: DC | PRN

## 2022-04-21 MED ORDER — ACETAMINOPHEN 650 MG RE SUPP: 650.0000 mg | RECTAL | Status: DC | PRN

## 2022-04-21 MED ORDER — APIXABAN 5 MG PO TABS: 5.0000 mg | ORAL_TABLET | Freq: Two times a day (BID) | ORAL | Status: DC

## 2022-04-21 MED ORDER — PANTOPRAZOLE SODIUM 40 MG PO TBEC
40.0000 mg | DELAYED_RELEASE_TABLET | Freq: Every day | ORAL | Status: DC
  Administered 2022-04-23: 40 mg via ORAL
  Filled 2022-04-21: qty 1

## 2022-04-21 MED ORDER — KETOROLAC TROMETHAMINE 15 MG/ML IJ SOLN
15.0000 mg | Freq: Once | INTRAMUSCULAR | Status: AC
  Administered 2022-04-21: 15 mg via INTRAVENOUS
  Filled 2022-04-21: qty 1

## 2022-04-21 MED ORDER — AMLODIPINE BESYLATE 10 MG PO TABS
10.0000 mg | ORAL_TABLET | Freq: Every day | ORAL | Status: DC
Start: 2022-04-21 — End: 2022-04-21

## 2022-04-21 MED ORDER — ACETAMINOPHEN 160 MG/5ML PO SOLN: 650.0000 mg | ORAL | Status: DC | PRN

## 2022-04-21 MED ORDER — MELATONIN 5 MG PO TABS
2.5000 mg | ORAL_TABLET | Freq: Every day | ORAL | Status: DC
  Administered 2022-04-22: 2.5 mg via ORAL
  Filled 2022-04-21: qty 1

## 2022-04-21 MED ORDER — ALBUTEROL SULFATE (2.5 MG/3ML) 0.083% IN NEBU
3.0000 mL | INHALATION_SOLUTION | Freq: Four times a day (QID) | RESPIRATORY_TRACT | Status: DC | PRN
Start: 2022-04-21 — End: 2022-04-23

## 2022-04-21 MED ORDER — POLYSACCHARIDE IRON COMPLEX 150 MG PO CAPS
150.0000 mg | ORAL_CAPSULE | Freq: Every day | ORAL | Status: DC
  Administered 2022-04-23: 150 mg via ORAL
  Filled 2022-04-21 (×3): qty 1

## 2022-04-21 MED ORDER — ATORVASTATIN CALCIUM 20 MG PO TABS
80.0000 mg | ORAL_TABLET | Freq: Every evening | ORAL | Status: DC
  Administered 2022-04-22: 80 mg via ORAL
  Filled 2022-04-21: qty 4

## 2022-04-21 MED ORDER — MIRTAZAPINE 15 MG PO TABS
7.5000 mg | ORAL_TABLET | Freq: Every day | ORAL | Status: DC
  Administered 2022-04-22: 7.5 mg via ORAL
  Filled 2022-04-21: qty 1

## 2022-04-21 MED ORDER — LACTATED RINGERS IV BOLUS
1000.0000 mL | Freq: Once | INTRAVENOUS | Status: AC
  Administered 2022-04-21: 1000 mL via INTRAVENOUS

## 2022-04-21 MED ORDER — SENNOSIDES-DOCUSATE SODIUM 8.6-50 MG PO TABS: 1.0000 | ORAL_TABLET | Freq: Every evening | ORAL | Status: DC | PRN

## 2022-04-21 NOTE — Assessment & Plan Note (Signed)
Continue Remeron Patient may need an increase in his dose to also help with his poor appetite

## 2022-04-21 NOTE — Progress Notes (Signed)
Received a message from cardiology about patient's transthoracic echocardiogram which showed a questionable abnormality of the aortic valve unable to exclude a valvular mass/vegetation as well as a pericardial effusion which appears larger when compared to an echo that was done in March. Patient will need a TEE for further evaluation Discussed findings with patient's wife over the phone and she is in agreement for her husband to have the TEE in a.m. Orders for n.p.o. past midnight placed

## 2022-04-21 NOTE — Assessment & Plan Note (Addendum)
Eliquis was initially held, resume today per neurology. Not on rate control agent likely due to bradycardia. Cardiology following.

## 2022-04-21 NOTE — ED Provider Notes (Signed)
[facility] Provider Note    Event Date/Time   First MD Initiated Contact with Patient 04/21/22 [phone]     (approximate)   History   Chief Complaint Weakness   HPI  Evan Townsend Sr. is a 66 y.o. male with past medical history of hypertension, hyperlipidemia, diabetes, atrial fibrillation on Eliquis, stroke with left hemiparesis, CKD, and peripheral vascular disease who presents to the ED complaining of weakness.  Wife at bedside reports that when he woke up yesterday morning, she noticed that he was weaker than usual.  He typically gets around with a cane but has been unable to walk for the past 24 hours.  Wife also states that he has not been eating or drinking like normal, has been having loose bowel movements on himself.  He complained of abdominal pain at times to his wife, but currently denies any pain in his abdomen.  He does state he has been feeling slightly dizzy along with some dysuria, denies any flank pain or hematuria.  Wife states that his speech has seemed more slurred than usual and he has been weaker on the right side than before.  He has not had any fevers and they are not aware of any sick contacts.  He has not had any cough, chest pain, or shortness of breath.     Physical Exam   Triage Vital Signs: ED Triage Vitals  Enc Vitals Group     BP 04/21/22 0852 (!) 150/93     Pulse Rate 04/21/22 0852 70     Resp 04/21/22 0852 18     Temp 04/21/22 0852 98.2 F (36.8 C)     Temp Source 04/21/22 0852 Oral     SpO2 04/21/22 0852 95 %     Weight --      Height --      Head Circumference --      Peak Flow --      Pain Score 04/21/22 0853 0     Pain Loc --      Pain Edu? --      Excl. in GC? --     Most recent vital signs: Vitals:   04/21/22 0852 04/21/22 0935  BP: (!) 150/93 (!) 160/85  Pulse: 70 92  Resp: 18 19  Temp: 98.2 F (36.8 C)   SpO2: 95% 100%    Constitutional: Alert and oriented. Eyes: Conjunctivae are normal. Head:  Atraumatic. Nose: No congestion/rhinnorhea. Mouth/Throat: Mucous membranes are dry. Cardiovascular: Normal rate, regular rhythm. Grossly normal heart sounds.  2+ radial pulses bilaterally. Respiratory: Normal respiratory effort.  No retractions. Lungs CTAB. Gastrointestinal: Soft and nontender. No distention. Musculoskeletal: No lower extremity tenderness nor edema.  Neurologic: Slurred speech noted, left hemiparesis noted.  5 out of 5 strength in right upper extremity, 4+ out of 5 strength in the right lower extremity.    ED Results / Procedures / Treatments   Labs (all labs ordered are listed, but only abnormal results are displayed) Labs Reviewed  APTT - Abnormal; Notable for the following components:      Result Value   aPTT 37 (*)    All other components within normal limits  CBC - Abnormal; Notable for the following components:   RBC 4.00 (*)    Hemoglobin 10.6 (*)    HCT 33.5 (*)    RDW 19.0 (*)    All other components within normal limits  COMPREHENSIVE METABOLIC PANEL - Abnormal; Notable for the following components:  Chloride 113 (*)    CO2 21 (*)    Glucose, Bld 114 (*)    BUN 27 (*)    Creatinine, Ser 1.82 (*)    ALT 47 (*)    Alkaline Phosphatase 164 (*)    GFR, Estimated 41 (*)    All other components within normal limits  URINALYSIS, ROUTINE W REFLEX MICROSCOPIC - Abnormal; Notable for the following components:   Color, Urine YELLOW (*)    APPearance HAZY (*)    Glucose, UA >=500 (*)    Hgb urine dipstick SMALL (*)    Protein, ur >=300 (*)    Bacteria, UA RARE (*)    All other components within normal limits  SARS CORONAVIRUS 2 BY RT PCR  PROTIME-INR  DIFFERENTIAL  TROPONIN I (HIGH SENSITIVITY)  TROPONIN I (HIGH SENSITIVITY)     EKG  ED ECG REPORT I, Chesley Noon, the attending physician, personally viewed and interpreted this ECG.   Date: 04/21/2022  EKG Time: 8;55  Rate: 68  Rhythm: normal sinus rhythm  Axis: Normal  Intervals:right  bundle branch block  ST&T Change: None  RADIOLOGY CT head reviewed and interpreted by me with no hemorrhage or midline shift.  Chest x-ray reviewed and interpreted by me with no infiltrate, edema, or effusion.  PROCEDURES:  Critical Care performed: No  Procedures   MEDICATIONS ORDERED IN ED: Medications  lactated ringers bolus 1,000 mL (1,000 mLs Intravenous New Bag/Given 04/21/22 0942)     IMPRESSION / MDM / ASSESSMENT AND PLAN / ED COURSE  I reviewed the triage vital signs and the nursing notes.                              66 y.o. male with past medical history of hypertension, hyperlipidemia, diabetes, stroke with left hemiparesis, atrial fibrillation on Eliquis, CKD, and peripheral vascular disease who presents to the ED with 24 hours of increased weakness on his right side along with slurred speech, diarrhea, dizziness, and dysuria.  Patient's presentation is most consistent with acute presentation with potential threat to life or bodily function.  Differential diagnosis includes, but is not limited to, stroke, TIA, intracranial hemorrhage, dehydration, electrolyte abnormality, AKI, anemia, UTI, pneumonia, viral syndrome.  Patient nontoxic-appearing and in no acute distress, vital signs are unremarkable.  Left hemiparesis is stable compared to previous, he does appear slightly weaker in his right lower extremity compared to reported baseline.  We will check CT head but patient is outside the window for intervention if he has in fact had a stroke.  We will check for infectious process with chest x-ray and urinalysis, also performed testing for COVID-19.  Labs are pending at this time, we will hydrate with IV fluids.  He has a benign abdominal exam and we will hold off on imaging of his abdomen.  CT head is negative for acute process, chest x-ray is unremarkable and urinalysis shows no signs of infection.  Labs are reassuring with stable chronic kidney disease, no significant  electrolyte abnormality and LFTs are unremarkable.  No significant anemia, leukocytosis, or troponin elevation noted.  Patient may be be weak from GI illness given his diarrhea and nausea, however he would benefit from further stroke work-up given acute onset right-sided weakness with worsening slurred speech.  Case discussed with hospitalist for admission.      FINAL CLINICAL IMPRESSION(S) / ED DIAGNOSES   Final diagnoses:  Weakness  Diarrhea, unspecified type  Slurred  speech     Rx / DC Orders   ED Discharge Orders     None        Note:  This document was prepared using Dragon voice recognition software and may include unintentional dictation errors.   Chesley Noon, MD 04/21/22 1147

## 2022-04-21 NOTE — Progress Notes (Signed)
       CROSS COVER NOTE  NAME: [name] MRN: [id number] DOB : Oct 13, 1955    Date of Service   04/21/2022   HPI/Events of Note   Medication request received for 6/10 arm pain while NPO.  0231: Evan Townsend reports pain was unrelieved by toradol and is requesting alternative.  Interventions   Plan: Toradol 15 mg  Morphine 1 mg       This document was prepared using Dragon voice recognition software and may include unintentional dictation errors.  Bishop Limbo DNP, MHA, FNP-BC Nurse Practitioner [facility] Pager [phone]

## 2022-04-21 NOTE — Assessment & Plan Note (Addendum)
CKD stage IIIa Renal function appears stable Monitor closely

## 2022-04-21 NOTE — H&P (Signed)
History and Physical    Patient: Evan Townsend BXU:[id number] DOB: Jun 13, 1956 DOA: 04/21/2022 DOS: the patient was seen and examined on 04/21/2022 PCP: Charlynne Cousins, Townsend (Inactive)  Patient coming from: Home  Chief Complaint:  Chief Complaint  Patient presents with   Weakness   Most of the history was obtained from his wife HPI: Evan EUSTICE Sr. is a 66 y.o. male with medical history significant for CVA with left-sided hemiparesis, history of paroxysmal A-fib, hypertension, dyslipidemia, diabetes mellitus, diastolic dysfunction CHF who was brought into the ER via private vehicle for evaluation of new onset right-sided weakness that started about noon on the day prior to his admission associated with inability to ambulate and a fall. Most of the history was obtained from patient's wife who is at the bedside and who states that at baseline he has left-sided hemiparesis but is able to ambulate with a cane.  She notes that 1 day prior to his admission he had difficulty getting around due to right-sided weakness and she also notes that he had slurred speech which she said has improved since he arrived in the ER. Patient has had poor oral intake as well as several episodes of diarrhea. He denies recent antibiotic use and denies any sick contacts.  He states that his appetite is poor which is why he has not been eating or drinking like he should. He denies having any chest pain, no shortness of breath, no fever, no chills, no headache, no abdominal pain, no urinary symptoms, no dizziness, no lightheadedness, no blurred vision or any focal deficit. He had an initial CT scan of the head without contrast which showed no acute findings. Patient will be referred to observation status for further evaluation     Review of Systems: As mentioned in the history of present illness. All other systems reviewed and are negative. Past Medical History:  Diagnosis Date   Carotid arterial disease ([place])     a. 08/2018 Carotid U/S: min-mod RICA atherosclerosis w/o hemodynamically significant stenosis. Nl LICA.   Diabetes 1.5, managed as type 2 (Evan Townsend)    Diastolic dysfunction    a. 08/2018 Echo: EF 65%. No rwma. Gr1 DD. Mild MR.   Diastolic dysfunction    a. 08/2019 Echo: EF 55-60%, Gr1 DD. No rwma. Mild MR. RVSP 37.20mHg.   Hypercholesterolemia    Hypertension    PAF (paroxysmal atrial fibrillation) (Evan Townsend    a. 10/2019 Event Monitor: PAF; b. CHA2DS2VASc = 5-->Eliquis.   Poorly controlled diabetes mellitus (Evan Townsend    a. 04/2019 A1c 13.8.   Recurrent strokes (Evan Townsend    a. 10/2016 MRI/A: Acute 561mR thalamic infarct, ? subacute infarct of R corona radiata; b. 08/2017 MRI/A: Acute 56m52materal L thalamic infarct. Other more remote lacunar infarcts of thalami bilat. Small vessel dzs; c. 08/2018 MRI/A: Acute lacunar infarct of the post limb of R internal capsule; d. 08/2019 MRI Acute CVA of L paramedian pons adn R cerebellar hemisphere.   Tobacco abuse    Past Surgical History:  Procedure Laterality Date   ESOPHAGOGASTRODUODENOSCOPY (EGD) WITH PROPOFOL N/A 04/26/2019   Procedure: ESOPHAGOGASTRODUODENOSCOPY (EGD) WITH PROPOFOL;  Surgeon: Evan Townsend;  Location: [facility];  Service: Gastroenterology;  Laterality: N/A;   LEFT HEART CATH AND CORONARY ANGIOGRAPHY N/A 01/09/2022   Procedure: LEFT HEART CATH AND CORONARY ANGIOGRAPHY;  Surgeon: Evan Townsend;  [facility];  Service: Cardiovascular;  Laterality: N/A;   NO PAST SURGERIES     Social History:  reports that he has quit smoking. His smoking use included cigarettes. He started smoking about 41 years ago. He has a 38.00 pack-year smoking history. He has never used smokeless tobacco. He reports that he does not currently use alcohol after a past usage of about 1.0 standard drink of alcohol per week. He reports that he does not currently use drugs after having used the following drugs: Cocaine and Marijuana.  No Known  Allergies  Family History  Problem Relation Age of Onset   Hypertension Mother    Stroke Mother        died @ age 48   Hypertension Father    Diabetes Father    Heart attack Father        died @ 67    Prior to Admission medications   Medication Sig Start Date End Date Taking? Authorizing Provider  amLODipine (NORVASC) 10 MG tablet TAKE 1 TABLET BY MOUTH DAILY 03/27/22  Yes Evan, Jolene T, NP  apixaban (ELIQUIS) 5 MG TABS tablet Take 1 tablet (5 mg total) by mouth 2 (two) times daily. Need visit for further refills. 03/27/22  Yes Evan, Jolene T, NP  atorvastatin (LIPITOR) 80 MG tablet TAKE 1 TABLET BY MOUTH EVERY EVENING 03/27/22  Yes Evan, Jolene T, NP  dapagliflozin propanediol (FARXIGA) 10 MG TABS tablet Take 1 tablet (10 mg total) by mouth daily before breakfast. In place of glipizide 01/01/22  Yes Evan, Avanti, Townsend  iron polysaccharides (NIFEREX) 150 MG capsule Take 1 capsule (150 mg total) by mouth daily. Can take any form of over-the-counter iron supplement. 01/12/22  Yes Evan Townsend  melatonin 1 MG TABS tablet TAKE 1 TABLET BY MOUTH AT BEDTIME 01/20/22  Yes Evan, Avanti, Townsend  metoCLOPramide (REGLAN) 5 MG tablet TAKE 1 TABLET BY MOUTH 3 TIMES DAILY BEFORE MEALS 02/03/22  Yes Evan, Avanti, Townsend  mirtazapine (REMERON) 7.5 MG tablet Take 1 tablet (7.5 mg total) by mouth at bedtime. 01/01/22  Yes Evan, Avanti, Townsend  omeprazole (PRILOSEC) 40 MG capsule TAKE 1 CAPSULE BY MOUTH IN THE MORNING AND AT BEDTIME 03/27/22  Yes Evan, Jolene T, NP  tamsulosin (FLOMAX) 0.4 MG CAPS capsule Take 1 capsule (0.4 mg total) by mouth daily. 02/25/22  Yes Evan Townsend  ACCU-CHEK GUIDE test strip Check fsbs once daily 12/24/20   Evan Townsend  Accu-Chek Softclix Lancets lancets SMARTSIG:Topical 12/24/20   Evan Townsend  albuterol (VENTOLIN HFA) 108 (90 Base) MCG/ACT inhaler Inhale 2 puffs into the lungs every 6 (six) hours as needed for wheezing or shortness of breath. 02/26/21   Evan, Avanti,  Townsend  Blood Glucose Monitoring Suppl KIT Accucheck Guide with lancets and test strips #100 with one refill.  Test blood sugar twice daily. DX Code E11.65 ; Z79.4 07/09/20   Evan Townsend  Continuous Blood Gluc Sensor (FREESTYLE LIBRE 2 SENSOR) MISC 1 each by Does not apply route every 14 (fourteen) days. 06/20/21   Charlynne Cousins, Townsend  Incontinence Supply Disposable (COMFORT PROTECT ADULT DIAPER/L) MISC 1 Units by Does not apply route daily. Use 3-4 a day as needed 04/06/22   Mecum, Dani Gobble, PA-C  Insulin Pen Needle 32G X 6 MM MISC Daily 10/24/20   Evan Townsend  Misc. Devices MISC One pair of Compression stockings  Hemiplegia and hemiparesis following cerebral infarction affecting left non-dominant side (HCC)  - Primary Codes: F02.637 Lower extremity edema  Codes: R60.0 Type 2 diabetes mellitus with hyperglycemia, with long-term current use of insulin (  [place])  Codes: E11.65, Z79.4 05/16/20   Evan Townsend  nitroGLYCERIN (NITROSTAT) 0.4 MG SL tablet Place 1 tablet (0.4 mg total) under the tongue every 5 (five) minutes as needed for chest pain. 09/16/21   Emeterio Reeve, DO    Physical Exam: Vitals:   04/21/22 0852 04/21/22 0935  BP: (!) 150/93 (!) 160/85  Pulse: 70 92  Resp: 18 19  Temp: 98.2 F (36.8 C)   TempSrc: Oral   SpO2: 95% 100%   Physical Exam Vitals and nursing note reviewed.  Constitutional:      Comments: Chronically ill-appearing  HENT:     Head: Normocephalic and atraumatic.     Nose: Nose normal.     Mouth/Throat:     Mouth: Mucous membranes are dry.  Eyes:     Conjunctiva/sclera: Conjunctivae normal.  Cardiovascular:     Rate and Rhythm: Normal rate and regular rhythm.  Pulmonary:     Effort: Pulmonary effort is normal.     Breath sounds: Normal breath sounds.  Abdominal:     General: Abdomen is flat. Bowel sounds are normal.     Palpations: Abdomen is soft.  Musculoskeletal:        General: Normal range of motion.     Cervical  back: Normal range of motion and neck supple.  Skin:    General: Skin is warm and dry.  Neurological:     Mental Status: He is alert.     Comments: Left-sided hemiparesis from prior stroke.  Able to move his right upper and lower extremities  Psychiatric:        Mood and Affect: Mood normal.        Behavior: Behavior normal.     Data Reviewed: Relevant notes from primary care and specialist visits, past discharge summaries as available in EHR, including Care Everywhere. Prior diagnostic testing as pertinent to current admission diagnoses Updated medications and problem lists for reconciliation ED course, including vitals, labs, imaging, treatment and response to treatment Triage notes, nursing and pharmacy notes and ED provider's notes Notable results as noted in HPI Labs reviewed.  Sodium 141, potassium 3.8, chloride 113, bicarb 21, glucose 114, BUN 27, creatinine 1.82, calcium 9.3, total protein 8.1, albumin 3.7, AST 29, ALT 47, alkaline phosphatase 164, total bilirubin 0.7, PT 13.6, INR 1.1, white count 5.6, hemoglobin 10.6, hematocrit 33.5, MCV 83.8, platelet count 267 SARS coronavirus 2 PCR is negative Chest x-ray reviewed by me shows Left mid to lower lung horizontal linear likely subsegmental atelectasis versus scarring. This appears slightly increased from 01/14/2022 which may be due to differences in technique. This is not significantly changed from 01/06/2022 on frontal view but appears improved on lateral view. Twelve-lead EKG reviewed by me shows normal sinus rhythm with a right bundle branch block. There are no new results to review at this time.  Assessment and Plan: * Weakness Patient presents for evaluation of weakness and inability to ambulate  Has a history of a prior CVA with left-sided hemiparesis and wife is concerned about  new right-sided weakness Patient able to move his right upper and lower extremity but according to his wife has been unable to  ambulate Generalized weakness may be secondary to dehydration from poor oral intake Obtain an MRI of the brain to rule out an acute stroke We will request PT/OT/ST consult Continue atorvastatin  Recurrent strokes ([place]) Patient has a history of recurrent strokes with left-sided hemiparesis Continue aggressive risk factor modification Continue apixaban, atorvastatin and optimize  blood pressure control  CKD stage 3 due to type 2 diabetes mellitus ([place]) Patient has type II diabetes mellitus with complications of stage III chronic kidney disease Renal function appears stable Monitor closely during this hospitalization Hold oral hypoglycemic agents since patient has very poor oral intake Check blood sugars every 4 hours while NPO  Paroxysmal atrial fibrillation (HCC) Continue apixaban as secondary prophylaxis for an acute stroke  Primary hypertension Continue amlodipine  Moderate major depression ([place]) Continue Remeron Patient may need an increase in his dose to also help with his poor appetite      Advance Care Planning:   Code Status: Full Code   Consults: PT/OT/ST  Family Communication: Greater than 50% of time was spent discussing patient's condition and plan of care with his wife at the bedside.  All questions and concerns have been addressed.  She verbalizes understanding and agrees with the plan.  Severity of Illness: The appropriate patient status for this patient is OBSERVATION. Observation status is judged to be reasonable and necessary in order to provide the required intensity of service to ensure the patient's safety. The patient's presenting symptoms, physical exam findings, and initial radiographic and laboratory data in the context of their medical condition is felt to place them at decreased risk for further clinical deterioration. Furthermore, it is anticipated that the patient will be medically stable for discharge from the hospital within 2 midnights of admission.    Author: Collier Bullock, Townsend 04/21/2022 1:42 PM  For on call review [email or web address].

## 2022-04-21 NOTE — Assessment & Plan Note (Addendum)
Patient presents for evaluation of weakness and inability to ambulate  Has a history of a prior CVA with left-sided hemiparesis and wife is concerned about  new right-sided weakness.   Patient able to move his right upper and lower extremity but according to his wife has been unable to ambulate. Generalized weakness may be secondary to dehydration from poor oral intake. MRI of the brain showed acute Left pontine stroke. --Neurology following --PT/OT to continue with [facility] --Fall precautions --Mgmt as outlined above/below

## 2022-04-21 NOTE — Assessment & Plan Note (Signed)
Patient has a history of recurrent strokes with left-sided hemiparesis. Now with acute left pontine stroke. --Continue aggressive risk factor modification --See acute CVA for mgmt

## 2022-04-21 NOTE — Assessment & Plan Note (Addendum)
Increased amlodipine at 5>>10 mg with BP's remaining elevated.

## 2022-04-21 NOTE — ED Triage Notes (Signed)
Pt to ED via POV from home. Pt family reports decreased appetite, diarrhea and increased weakness on right side that started yesterday. Pt hx 3 strokes and has left side deficits and slurred speech. Pt takes eliquis.

## 2022-04-22 ENCOUNTER — Observation Stay (HOSPITAL_COMMUNITY)
Admit: 2022-04-22 | Discharge: 2022-04-22 | Disposition: A | Discharge status: Home / Self Care | Payer: Medicare Other | Attending: Cardiology | Admitting: Cardiology

## 2022-04-22 ENCOUNTER — Observation Stay: Payer: Medicare Other

## 2022-04-22 ENCOUNTER — Encounter
Admission: EM | Disposition: A | Discharge status: Home Health | Payer: Self-pay | Source: Home / Self Care | Attending: Internal Medicine

## 2022-04-22 DIAGNOSIS — I6302 Cerebral infarction due to thrombosis of basilar artery: Secondary | ICD-10-CM | POA: Diagnosis present

## 2022-04-22 DIAGNOSIS — I639 Cerebral infarction, unspecified: Secondary | ICD-10-CM | POA: Diagnosis not present

## 2022-04-22 DIAGNOSIS — Z20822 Contact with and (suspected) exposure to covid-19: Secondary | ICD-10-CM | POA: Diagnosis present

## 2022-04-22 DIAGNOSIS — Z8249 Family history of ischemic heart disease and other diseases of the circulatory system: Secondary | ICD-10-CM | POA: Diagnosis not present

## 2022-04-22 DIAGNOSIS — E782 Mixed hyperlipidemia: Secondary | ICD-10-CM | POA: Diagnosis not present

## 2022-04-22 DIAGNOSIS — I672 Cerebral atherosclerosis: Secondary | ICD-10-CM | POA: Diagnosis not present

## 2022-04-22 DIAGNOSIS — R29709 NIHSS score 9: Secondary | ICD-10-CM | POA: Diagnosis present

## 2022-04-22 DIAGNOSIS — E1122 Type 2 diabetes mellitus with diabetic chronic kidney disease: Secondary | ICD-10-CM | POA: Diagnosis present

## 2022-04-22 DIAGNOSIS — E119 Type 2 diabetes mellitus without complications: Secondary | ICD-10-CM | POA: Diagnosis not present

## 2022-04-22 DIAGNOSIS — I130 Hypertensive heart and chronic kidney disease with heart failure and stage 1 through stage 4 chronic kidney disease, or unspecified chronic kidney disease: Secondary | ICD-10-CM | POA: Diagnosis present

## 2022-04-22 DIAGNOSIS — N1831 Chronic kidney disease, stage 3a: Secondary | ICD-10-CM | POA: Diagnosis present

## 2022-04-22 DIAGNOSIS — E1151 Type 2 diabetes mellitus with diabetic peripheral angiopathy without gangrene: Secondary | ICD-10-CM | POA: Diagnosis present

## 2022-04-22 DIAGNOSIS — E11649 Type 2 diabetes mellitus with hypoglycemia without coma: Secondary | ICD-10-CM | POA: Diagnosis not present

## 2022-04-22 DIAGNOSIS — R471 Dysarthria and anarthria: Secondary | ICD-10-CM | POA: Diagnosis present

## 2022-04-22 DIAGNOSIS — Z794 Long term (current) use of insulin: Secondary | ICD-10-CM | POA: Diagnosis not present

## 2022-04-22 DIAGNOSIS — I340 Nonrheumatic mitral (valve) insufficiency: Secondary | ICD-10-CM | POA: Diagnosis not present

## 2022-04-22 DIAGNOSIS — I3139 Other pericardial effusion (noninflammatory): Secondary | ICD-10-CM | POA: Diagnosis present

## 2022-04-22 DIAGNOSIS — Z8673 Personal history of transient ischemic attack (TIA), and cerebral infarction without residual deficits: Secondary | ICD-10-CM | POA: Diagnosis not present

## 2022-04-22 DIAGNOSIS — R531 Weakness: Secondary | ICD-10-CM | POA: Diagnosis not present

## 2022-04-22 DIAGNOSIS — R4781 Slurred speech: Secondary | ICD-10-CM | POA: Diagnosis present

## 2022-04-22 DIAGNOSIS — I5032 Chronic diastolic (congestive) heart failure: Secondary | ICD-10-CM | POA: Diagnosis present

## 2022-04-22 DIAGNOSIS — I10 Essential (primary) hypertension: Secondary | ICD-10-CM | POA: Diagnosis not present

## 2022-04-22 DIAGNOSIS — E162 Hypoglycemia, unspecified: Secondary | ICD-10-CM | POA: Diagnosis not present

## 2022-04-22 DIAGNOSIS — F321 Major depressive disorder, single episode, moderate: Secondary | ICD-10-CM | POA: Diagnosis present

## 2022-04-22 DIAGNOSIS — E7800 Pure hypercholesterolemia, unspecified: Secondary | ICD-10-CM | POA: Diagnosis present

## 2022-04-22 DIAGNOSIS — Z7901 Long term (current) use of anticoagulants: Secondary | ICD-10-CM | POA: Diagnosis not present

## 2022-04-22 DIAGNOSIS — I480 Paroxysmal atrial fibrillation: Secondary | ICD-10-CM | POA: Diagnosis present

## 2022-04-22 DIAGNOSIS — G8191 Hemiplegia, unspecified affecting right dominant side: Secondary | ICD-10-CM | POA: Diagnosis present

## 2022-04-22 DIAGNOSIS — E1165 Type 2 diabetes mellitus with hyperglycemia: Secondary | ICD-10-CM | POA: Diagnosis present

## 2022-04-22 DIAGNOSIS — Z87891 Personal history of nicotine dependence: Secondary | ICD-10-CM | POA: Diagnosis not present

## 2022-04-22 DIAGNOSIS — I69354 Hemiplegia and hemiparesis following cerebral infarction affecting left non-dominant side: Secondary | ICD-10-CM | POA: Diagnosis not present

## 2022-04-22 DIAGNOSIS — I4510 Unspecified right bundle-branch block: Secondary | ICD-10-CM | POA: Diagnosis present

## 2022-04-22 DIAGNOSIS — I6602 Occlusion and stenosis of left middle cerebral artery: Secondary | ICD-10-CM | POA: Diagnosis not present

## 2022-04-22 DIAGNOSIS — I63233 Cerebral infarction due to unspecified occlusion or stenosis of bilateral carotid arteries: Secondary | ICD-10-CM | POA: Diagnosis not present

## 2022-04-22 HISTORY — PX: TEE WITHOUT CARDIOVERSION: SHX5443

## 2022-04-22 HISTORY — DX: Cerebral infarction, unspecified: I63.9

## 2022-04-22 LAB — LIPID PANEL
Cholesterol: 128 mg/dL (ref 0–200)
HDL: 47 mg/dL (ref >40)
LDL Cholesterol: 63 mg/dL (ref 0–99)
Total CHOL/HDL Ratio: 2.7 RATIO
Triglycerides: 91 mg/dL (ref <150)
VLDL: 18 mg/dL (ref 0–40)

## 2022-04-22 LAB — GLUCOSE, CAPILLARY
Glucose-Capillary: 62 mg/dL — ABNORMAL LOW (ref 70–99)
Glucose-Capillary: 107 mg/dL — ABNORMAL HIGH (ref 70–99)
Glucose-Capillary: 78 mg/dL (ref 70–99)
Glucose-Capillary: 85 mg/dL (ref 70–99)
Glucose-Capillary: 132 mg/dL — ABNORMAL HIGH (ref 70–99)
Glucose-Capillary: 91 mg/dL (ref 70–99)

## 2022-04-22 SURGERY — ECHOCARDIOGRAM, TRANSESOPHAGEAL
Anesthesia: Moderate Sedation

## 2022-04-22 MED ORDER — LIDOCAINE VISCOUS HCL 2 % MT SOLN
OROMUCOSAL | Status: AC
  Filled 2022-04-22: qty 15

## 2022-04-22 MED ORDER — FENTANYL CITRATE (PF) 100 MCG/2ML IJ SOLN
INTRAMUSCULAR | Status: AC
  Filled 2022-04-22: qty 2

## 2022-04-22 MED ORDER — AMLODIPINE BESYLATE 5 MG PO TABS
5.0000 mg | ORAL_TABLET | Freq: Every day | ORAL | Status: DC
  Administered 2022-04-22 – 2022-04-23 (×2): 5 mg via ORAL
  Filled 2022-04-22 (×2): qty 1

## 2022-04-22 MED ORDER — FENTANYL CITRATE (PF) 100 MCG/2ML IJ SOLN
INTRAMUSCULAR | Status: AC | PRN
  Administered 2022-04-22: 50 ug via INTRAVENOUS

## 2022-04-22 MED ORDER — MIDAZOLAM HCL 2 MG/2ML IJ SOLN
INTRAMUSCULAR | Status: AC | PRN
  Administered 2022-04-22: 1 mg via INTRAVENOUS

## 2022-04-22 MED ORDER — BUTAMBEN-TETRACAINE-BENZOCAINE 2-2-14 % EX AERO
INHALATION_SPRAY | CUTANEOUS | Status: DC
Start: 2022-04-22 — End: 2022-04-22
  Filled 2022-04-22: qty 5

## 2022-04-22 MED ORDER — SODIUM CHLORIDE FLUSH 0.9 % IV SOLN
INTRAVENOUS | Status: AC
  Filled 2022-04-22: qty 10

## 2022-04-22 MED ORDER — DEXTROSE 50 % IV SOLN
12.5000 g | INTRAVENOUS | Status: AC
  Administered 2022-04-22: 12.5 g via INTRAVENOUS
  Filled 2022-04-22: qty 50

## 2022-04-22 MED ORDER — SODIUM CHLORIDE 0.9 % IV SOLN: INTRAVENOUS | Status: DC

## 2022-04-22 MED ORDER — LIDOCAINE VISCOUS HCL 2 % MT SOLN
OROMUCOSAL | Status: AC | PRN
  Administered 2022-04-22: 15 mL via OROMUCOSAL

## 2022-04-22 MED ORDER — MORPHINE SULFATE (PF) 2 MG/ML IV SOLN
1.0000 mg | Freq: Once | INTRAVENOUS | Status: AC
  Administered 2022-04-22: 1 mg via INTRAVENOUS
  Filled 2022-04-22: qty 1

## 2022-04-22 MED ORDER — OXYCODONE HCL 5 MG PO TABS: 5.0000 mg | ORAL_TABLET | Freq: Once | ORAL | Status: DC

## 2022-04-22 MED ORDER — MIDAZOLAM HCL 2 MG/2ML IJ SOLN
INTRAMUSCULAR | Status: AC
  Filled 2022-04-22: qty 2

## 2022-04-22 NOTE — Assessment & Plan Note (Addendum)
With hypoglycemia during this admission.   --Home meds were held, resume at d/c

## 2022-04-22 NOTE — Inpatient Diabetes Management (Signed)
Inpatient Diabetes Program Recommendations  AACE/ADA: New Consensus Statement on Inpatient Glycemic Control   Target Ranges:  Prepandial:   less than 140 mg/dL      Peak postprandial:   less than 180 mg/dL (1-2 hours)      Critically ill patients:  140 - 180 mg/dL    Latest Reference Range & Units 04/22/22 04:45 04/22/22 06:35 04/22/22 08:37  Glucose-Capillary 70 - 99 mg/dL 62 (L) 628 (H) 78    Latest Reference Range & Units 04/21/22 16:28 04/21/22 20:18  Glucose-Capillary 70 - 99 mg/dL 94 83   Review of Glycemic Control  Diabetes history: DM2 Outpatient Diabetes medications: Farxiga 10 mg QAM, FreeStyle Libre2 Current orders for Inpatient glycemic control: CBGs  Inpatient Diabetes Program Recommendations:    Insulin: While inpatient, please consider ordering Novolog 0-6 units TID with meals.  Thanks, Orlando Penner, RN, MSN, CDCES Diabetes Coordinator Inpatient Diabetes Program [phone] (Team Pager from 8am to 5pm)

## 2022-04-22 NOTE — Progress Notes (Signed)
Pts blood glucose 62 mg/dL. Pt asymptomatic but pt is also strict NPO pending speech evaluation. Hospitalist notified of blood glucose result and requested PRN dextrose IV.

## 2022-04-22 NOTE — Progress Notes (Signed)
Pt HR sustaining 40-50's. Will drop as low at 35 however will come back up. Bp is stable pt denies dizziness. Notified MD Denton Lank and Charlsie Quest NP

## 2022-04-22 NOTE — TOC Initial Note (Addendum)
Transition of Care (TOC) - Initial/Assessment Note    Patient Details  Name: Evan KUSS Sr. MRN: [id number] Date of Birth: Nov 28, 1955  Transition of Care [facility]) CM/SW Contact:    Truddie Hidden, RN Phone Number: 04/22/2022, 12:31 AM  Clinical Narrative:                 TEE planned for 9/6. PT/ OT/ ST eval pending. Will reassess for TOC needs.         Patient Goals and CMS Choice        Expected Discharge Plan and Services                                                Prior Living Arrangements/Services                       Activities of Daily Living Home Assistive Devices/Equipment: Eyeglasses, Dan Humphreys (specify type) ADL Screening (condition at time of admission) Patient's cognitive ability adequate to safely complete daily activities?: No Is the patient deaf or have difficulty hearing?: No Does the patient have difficulty seeing, even when wearing glasses/contacts?: No Does the patient have difficulty concentrating, remembering, or making decisions?: Yes Patient able to express need for assistance with ADLs?: No Does the patient have difficulty dressing or bathing?: Yes Independently performs ADLs?: No Communication: Independent Dressing (OT): Needs assistance Is this a change from baseline?: Pre-admission baseline Grooming: Needs assistance Is this a change from baseline?: Pre-admission baseline Feeding: Independent Bathing: Needs assistance Is this a change from baseline?: Pre-admission baseline Toileting: Needs assistance Is this a change from baseline?: Pre-admission baseline In/Out Bed: Needs assistance Is this a change from baseline?: Pre-admission baseline Walks in Home: Needs assistance Is this a change from baseline?: Pre-admission baseline Does the patient have difficulty walking or climbing stairs?: Yes Weakness of Legs: Both Weakness of Arms/Hands: None  Permission Sought/Granted                  Emotional  Assessment              Admission diagnosis:  Slurred speech [R47.81] Weakness [R53.1] Diarrhea, unspecified type [R19.7] Patient Active Problem List   Diagnosis Date Noted   Weakness 04/21/2022   CKD stage 3 due to type 2 diabetes mellitus (HCC) 04/21/2022   Hypothermia 01/10/2022   Acute on chronic anemia 01/08/2022   Symptomatic bradycardia 01/07/2022   BPH (benign prostatic hyperplasia) 01/07/2022   GERD without esophagitis 01/07/2022   Paroxysmal atrial fibrillation (HCC) 01/07/2022   Dyslipidemia 01/07/2022   Diabetes mellitus (HCC) 01/01/2022   Acute renal failure (HCC) 01/01/2022   Elevated liver enzymes 12/28/2021   Pneumonia 11/26/2021   Sepsis due to pneumonia (HCC) 11/26/2021   CAP (community acquired pneumonia) 11/26/2021   AKI (acute kidney injury) (HCC) 11/26/2021   Sinus pause    Bradycardia 10/16/2021   Hypertensive emergency without congestive heart failure 09/14/2021   Hypertension    Unstable angina (HCC)    Diabetes mellitus without complication (HCC) 08/12/2021   Primary hypertension 06/27/2021   Diabetic ulcer of toe of left foot associated with type 2 diabetes mellitus, limited to breakdown of skin (HCC) 06/27/2021   Need for pneumococcal vaccination 06/20/2021   Cerebrovascular accident (CVA) (HCC) 06/05/2021   Need for influenza vaccination 06/02/2021   Constipation 06/02/2021   Insomnia 06/02/2021  Gastroparesis 02/26/2021   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   Muscle spasm of left lower extremity 02/26/2021   History of stroke 02/26/2021   Type 2 diabetes mellitus with hyperglycemia (HCC) 10/29/2020   Diastolic dysfunction    History of diabetes with ketoacidosis 09/20/2020   CKD (chronic kidney disease) stage 2, GFR 60-89 ml/min 06/27/2020   Monitoring for anticoagulant use 06/27/2020   Lower extremity edema 05/16/2020   Atrial fibrillation, chronic (HCC) 10/26/2019   Anemia of chronic disease 09/05/2019   Acute kidney injury  superimposed on CKD (HCC) 09/05/2019   Coagulopathy (HCC) 06/12/2019   Gastroesophageal reflux disease    Hemiplegia and hemiparesis following cerebral infarction affecting left non-dominant side (HCC) 11/25/2018   RBBB 10/15/2018   Dysphagia, post-stroke    Microalbuminuria 07/29/2018   Hyperlipidemia LDL goal <70 02/11/2017   Recurrent strokes (HCC) 11/10/2016   Essential hypertension 11/10/2016   Uncontrolled type 2 diabetes mellitus with hyperglycemia, with long-term current use of insulin (HCC) 11/10/2016   Tobacco abuse 08/28/2013   Peripheral neuropathy 08/28/2013   PCP:  Loura Pardon, MD (Inactive) Pharmacy:   [facility] - [place], [place] - [address] [place] [address] Phone: [phone] Fax: [phone]  [facility] [address], [place] - [address] [place] [address] Phone: [phone] Fax: [phone]  [facility] [address]), [place] - [address] [place]) [place] [address] Phone: [phone] Fax: [phone]     Social Determinants of Health (SDOH) Interventions    Readmission Risk Interventions     No data to display

## 2022-04-22 NOTE — Progress Notes (Signed)
Shared Decision Making/Informed Consent The risks [esophageal damage, perforation (1:10,000 risk), bleeding, pharyngeal hematoma as well as other potential complications associated with conscious sedation including aspiration, arrhythmia, respiratory failure and death], benefits (treatment guidance and diagnostic support) and alternatives of a transesophageal echocardiogram were discussed in detail with Evan Townsend and he is willing to proceed. This was also discussed with his wife Evan Townsend at the bedside. If she is needed for anything please call her at this number. [phone].

## 2022-04-22 NOTE — Procedures (Signed)
Transesophageal Echocardiogram :  Indication: cva, evaluate for vegetation, thrombus Requesting/ordering  physician:   Procedure: 10 ml of viscous lidocaine were given orally to provide local anesthesia to the oropharynx. The patient was positioned supine on the left side, bite block provided. The patient was moderately sedated with the doses of versed and fentanyl as detailed below.  Using digital technique an omniplane probe was advanced into the esophagus without incident.   Moderate sedation: 1. Sedation used:  Versed: 1mg , Fentanyl: 2. Time administered:   1pm  Time when patient started recovery: 1:19pm 3. I was face to face during this time 19 mins  See report in EPIC  for complete details: In brief, imaging revealed normal LV function with no RWMAs and no mural apical thrombus.  .  Estimated ejection fraction was 60%.  Right sided cardiac chambers were normal with no evidence of pulmonary hypertension.  No evidence for thrombus or vegetation  Imaging of the septum showed no ASD or VSD Bubble study was negative for shunt 2D and color flow confirmed no PFO  The LA was well visualized in orthogonal views.  There was no spontaneous contrast and no thrombus in the LA and LA appendage   The descending thoracic aorta had no  mural aortic debris with no evidence of aneurysmal dilation or disection  Conclusion No evidence for thrombus or vegetation   Agbor-Etang 04/22/2022 1:21 PM

## 2022-04-22 NOTE — Plan of Care (Signed)
  Problem: Education: Goal: Knowledge of General Education information will improve Description: Including pain rating scale, medication(s)/side effects and non-pharmacologic comfort measures Outcome: Progressing   Problem: Health Behavior/Discharge Planning: Goal: Ability to manage health-related needs will improve Outcome: Progressing   Problem: Clinical Measurements: Goal: Ability to maintain clinical measurements within normal limits will improve Outcome: Progressing Goal: Will remain free from infection Outcome: Progressing Goal: Diagnostic test results will improve Outcome: Progressing Goal: Respiratory complications will improve Outcome: Progressing Goal: Cardiovascular complication will be avoided Outcome: Progressing   Problem: Activity: Goal: Risk for activity intolerance will decrease Outcome: Progressing   Problem: Nutrition: Goal: Adequate nutrition will be maintained Outcome: Progressing   Problem: Coping: Goal: Level of anxiety will decrease Outcome: Progressing   Problem: Elimination: Goal: Will not experience complications related to bowel motility Outcome: Progressing Goal: Will not experience complications related to urinary retention Outcome: Progressing   Problem: Pain Managment: Goal: General experience of comfort will improve Outcome: Progressing   Problem: Safety: Goal: Ability to remain free from injury will improve Outcome: Progressing   Problem: Skin Integrity: Goal: Risk for impaired skin integrity will decrease Outcome: Progressing   Problem: Education: Goal: Knowledge of disease or condition will improve Outcome: Progressing   Problem: Coping: Goal: Will verbalize positive feelings about self Outcome: Progressing Goal: Will identify appropriate support needs Outcome: Progressing   Problem: Health Behavior/Discharge Planning: Goal: Ability to manage health-related needs will improve Outcome: Progressing   Problem:  Self-Care: Goal: Ability to participate in self-care as condition permits will improve Outcome: Progressing Goal: Verbalization of feelings and concerns over difficulty with self-care will improve Outcome: Progressing Goal: Ability to communicate needs accurately will improve Outcome: Progressing   Problem: Nutrition: Goal: Risk of aspiration will decrease Outcome: Progressing Goal: Dietary intake will improve Outcome: Progressing   Problem: Intracerebral Hemorrhage Tissue Perfusion: Goal: Complications of Intracerebral Hemorrhage will be minimized Outcome: Progressing   Problem: Ischemic Stroke/TIA Tissue Perfusion: Goal: Complications of ischemic stroke/TIA will be minimized Outcome: Progressing   Problem: Spontaneous Subarachnoid Hemorrhage Tissue Perfusion: Goal: Complications of Spontaneous Subarachnoid Hemorrhage will be minimized Outcome: Progressing

## 2022-04-22 NOTE — Consult Note (Signed)
Neurology Consultation  Reason for Consult: Stroke Referring Physician: Dr. Francine Graven  CC: Right-sided weakness  History is obtained from: Patient, chart  HPI: Evan CHAPLAIN Sr. is a 66 y.o. male past medical history of prior stroke with significant left-sided hemiparesis, history of paroxysmal atrial fibrillation on Eliquis, hypertension, diabetes, hyperlipidemia, diastolic CHF who was brought in by his wife for evaluation for 2 days worth of right-sided weakness making ambulation much more difficult than baseline. The wife reports that usually he is able to walk with a cane since his previous stroke that has left him with severe weakness of the left side but over the past 2 days, he had had trouble using the right side as well as he was able to at baseline. Evan Townsend also noted that his speech was also more slurred than usual.  Evan Townsend has also reported poor p.o. intake and diarrhea ongoing at this time. No visual changes.  No headaches.  No chest pain shortness of breath.  No fevers chills.  No symptoms of frequent urination. He was evaluated in the emergency room-CT head unremarkable for acute process, admitted for further work-up due to new weakness.  MRI was done to rule out an acute stroke which showed a left pontine acute infarct. Stroke work-up was ordered.  Neurological consultation obtained  2D echocardiogram was completed as part of stroke work-up which showed LVEF of 55 to 60%, no regional wall motion abnormalities, grade 1 diastolic dysfunction.  Right ventricular systolic function normal.  Left atrium mildly dilated. It also showed a small pericardial effusion anterior to the right ventricle. More concerning finding is an echodensity adjacent to the LVOT side of the noncoronary leaflet on the aortic valve-while this may represent sclerosis or artifact, a valvular mass/vegetation cannot be excluded in the setting of recurrent strokes.  A TEE is planned.   LKW: At least 2 days ago-exact time  unclear IV thrombolysis given?: no, outside the window, also on apixaban Premorbid modified Rankin scale (mRS): 3   ROS: Full ROS was performed and is negative except as noted in the HPI.  Past Medical History:  Diagnosis Date   Carotid arterial disease ([place])    a. 08/2018 Carotid U/S: min-mod RICA atherosclerosis w/o hemodynamically significant stenosis. Nl LICA.   Diabetes 1.5, managed as type 2 ([place])    Diastolic dysfunction    a. 08/2018 Echo: EF 65%. No rwma. Gr1 DD. Mild MR.   Diastolic dysfunction    a. 08/2019 Echo: EF 55-60%, Gr1 DD. No rwma. Mild MR. RVSP 37.25mmHg.   Hypercholesterolemia    Hypertension    PAF (paroxysmal atrial fibrillation) ([place])    a. 10/2019 Event Monitor: PAF; b. CHA2DS2VASc = 5-->Eliquis.   Poorly controlled diabetes mellitus ([place])    a. 04/2019 A1c 13.8.   Recurrent strokes ([place])    a. 10/2016 MRI/A: Acute 108mm R thalamic infarct, ? subacute infarct of R corona radiata; b. 08/2017 MRI/A: Acute 70mm lateral L thalamic infarct. Other more remote lacunar infarcts of thalami bilat. Small vessel dzs; c. 08/2018 MRI/A: Acute lacunar infarct of the post limb of R internal capsule; d. 08/2019 MRI Acute CVA of L paramedian pons adn R cerebellar hemisphere.   Tobacco abuse     Family History  Problem Relation Age of Onset   Hypertension Mother    Stroke Mother        died @ age 31   Hypertension Father    Diabetes Father    Heart attack Father  died @ 75    Social History:   reports that he has quit smoking. His smoking use included cigarettes. He started smoking about 41 years ago. He has a 38.00 pack-year smoking history. He has never used smokeless tobacco. He reports that he does not currently use alcohol after a past usage of about 1.0 standard drink of alcohol per week. He reports that he does not currently use drugs after having used the following drugs: Cocaine and Marijuana.  Medications  Current Facility-Administered Medications:     acetaminophen (TYLENOL) tablet 650 mg, 650 mg, Oral, Q4H PRN **OR** acetaminophen (TYLENOL) 160 MG/5ML solution 650 mg, 650 mg, Per Tube, Q4H PRN **OR** acetaminophen (TYLENOL) suppository 650 mg, 650 mg, Rectal, Q4H PRN, Agbata, Tochukwu, MD   albuterol (PROVENTIL) (2.5 MG/3ML) 0.083% nebulizer solution 3 mL, 3 mL, Inhalation, Q6H PRN, Agbata, Tochukwu, MD   atorvastatin (LIPITOR) tablet 80 mg, 80 mg, Oral, QPM, Agbata, Tochukwu, MD   hydrALAZINE (APRESOLINE) injection 10 mg, 10 mg, Intravenous, Q6H PRN, Agbata, Tochukwu, MD   iron polysaccharides (NIFEREX) capsule 150 mg, 150 mg, Oral, Daily, Agbata, Tochukwu, MD   melatonin tablet 2.5 mg, 2.5 mg, Oral, QHS, Agbata, Tochukwu, MD   metoCLOPramide (REGLAN) tablet 5 mg, 5 mg, Oral, TID AC, Agbata, Tochukwu, MD   mirtazapine (REMERON) tablet 7.5 mg, 7.5 mg, Oral, QHS, Agbata, Tochukwu, MD   nitroGLYCERIN (NITROSTAT) SL tablet 0.4 mg, 0.4 mg, Sublingual, Q5 min PRN, Agbata, Tochukwu, MD   pantoprazole (PROTONIX) EC tablet 40 mg, 40 mg, Oral, Daily, Agbata, Tochukwu, MD   senna-docusate (Senokot-S) tablet 1 tablet, 1 tablet, Oral, QHS PRN, Agbata, Tochukwu, MD   tamsulosin (FLOMAX) capsule 0.4 mg, 0.4 mg, Oral, QPM, Agbata, Tochukwu, MD   Exam: Current vital signs: BP (!) 180/90 (BP Location: Left Arm)   Pulse 64   Temp (!) 97.4 F (36.3 C)   Resp 14   Ht 5\' 5"  (1.651 m)   Wt 65.1 kg   SpO2 100%   BMI 23.88 kg/m  Vital signs in last 24 hours: Temp:  [97.4 F (36.3 C)-99 F (37.2 C)] 97.4 F (36.3 C) (09/06 0837) Pulse Rate:  [49-92] 64 (09/06 0837) Resp:  [14-20] 14 (09/06 0837) BP: (160-184)/(80-92) 180/90 (09/06 0837) SpO2:  [100 %] 100 % (09/06 0837) Weight:  [65.1 kg] 65.1 kg (09/05 1422) General: Awake alert in no distress HEENT: Normocephalic atraumatic Lungs: Clear Cardiovascular: Irregularly irregular Abdomen nondistended nontender Extremities warm well perfused Neurological exam He is awake alert oriented x3 His  speech is moderate to severely dysarthric There is no evidence of aphasia Naming intact Repetition intact Comprehension intact Cranial nerves: Pupils are equal round react light, extraocular movements are unhindered, visual fields are full, facial sensation is intact, face appears grossly symmetric with maybe a subtle left lower facial weakness but the family did not seem to think that his face was any different than baseline, auditory acuity intact, tongue and palate midline. Motor examination left spastic hemiparesis, right side with no vertical drift but subjectively feels weaker. Sensation intact Coordination with mild dysmetria on the right.  Unable to perform on the left. NIH stroke scale 1a Level of Conscious.: 0 1b LOC Questions: 0 1c LOC Commands: 0 2 Best Gaze: 0 3 Visual: 0 4 Facial Palsy: 1 5a Motor Arm - left: 3 5b Motor Arm - Right: 0 6a Motor Leg - Left: 2 6b Motor Leg - Right: 0 7 Limb Ataxia: 1 8 Sensory: 0 9 Best Language: 0 10 Dysarthria:  2 11 Extinct. and Inatten.: 0 TOTAL: 9   Labs I have reviewed labs in epic and the results pertinent to this consultation are:  CBC    Component Value Date/Time   WBC 5.6 04/21/2022 0930   RBC 4.00 (L) 04/21/2022 0930   HGB 10.6 (L) 04/21/2022 0930   HGB 9.0 (L) 01/06/2022 1319   HCT 33.5 (L) 04/21/2022 0930   HCT 26.9 (L) 01/06/2022 1319   PLT 267 04/21/2022 0930   PLT 206 01/06/2022 1319   MCV 83.8 04/21/2022 0930   MCV 85 01/06/2022 1319   MCV 88 03/15/2013 1649   MCH 26.5 04/21/2022 0930   MCHC 31.6 04/21/2022 0930   RDW 19.0 (H) 04/21/2022 0930   RDW 15.8 (H) 01/06/2022 1319   RDW 14.5 03/15/2013 1649   LYMPHSABS 2.0 04/21/2022 0930   LYMPHSABS 1.1 01/06/2022 1319   LYMPHSABS 2.7 03/15/2013 1649   MONOABS 0.5 04/21/2022 0930   MONOABS 0.5 03/15/2013 1649   EOSABS 0.1 04/21/2022 0930   EOSABS 0.0 01/06/2022 1319   EOSABS 0.1 03/15/2013 1649   BASOSABS 0.0 04/21/2022 0930   BASOSABS 0.0 01/06/2022 1319    BASOSABS 0.1 03/15/2013 1649    CMP     Component Value Date/Time   NA 141 04/21/2022 0930   NA 138 01/06/2022 1319   NA 135 (L) 03/15/2013 1649   K 3.8 04/21/2022 0930   K 4.3 03/15/2013 1649   CL 113 (H) 04/21/2022 0930   CL 97 (L) 03/15/2013 1649   CO2 21 (L) 04/21/2022 0930   CO2 30 03/15/2013 1649   GLUCOSE 114 (H) 04/21/2022 0930   GLUCOSE 372 (H) 03/15/2013 1649   BUN 27 (H) 04/21/2022 0930   BUN 22 01/06/2022 1319   BUN 21 (H) 03/15/2013 1649   CREATININE 1.82 (H) 04/21/2022 0930   CREATININE 1.38 (H) 05/16/2020 1212   CALCIUM 9.3 04/21/2022 0930   CALCIUM 9.8 03/15/2013 1649   PROT 8.1 04/21/2022 0930   PROT 6.7 01/06/2022 1319   PROT 7.9 03/15/2013 1649   ALBUMIN 3.7 04/21/2022 0930   ALBUMIN 4.0 01/06/2022 1319   ALBUMIN 4.0 03/15/2013 1649   AST 29 04/21/2022 0930   AST 11 (L) 03/15/2013 1649   ALT 47 (H) 04/21/2022 0930   ALT 31 03/15/2013 1649   ALKPHOS 164 (H) 04/21/2022 0930   ALKPHOS 131 03/15/2013 1649   BILITOT 0.7 04/21/2022 0930   BILITOT <0.2 01/06/2022 1319   BILITOT 0.2 03/15/2013 1649   GFRNONAA 41 (L) 04/21/2022 0930   GFRNONAA 54 (L) 05/16/2020 1212   GFRAA 75 10/03/2020 1142   GFRAA 63 05/16/2020 1212    Lipid Panel     Component Value Date/Time   CHOL 128 04/22/2022 0519   CHOL 134 11/12/2021 1532   TRIG 91 04/22/2022 0519   HDL 47 04/22/2022 0519   HDL 53 11/12/2021 1532   CHOLHDL 2.7 04/22/2022 0519   VLDL 18 04/22/2022 0519   LDLCALC 63 04/22/2022 0519   LDLCALC 52 11/12/2021 1532   LDLCALC 65 02/13/2020 1417   A1c-7.0 LDL-63  2D echocardiogram  LVEF of 55 to 60%, no regional wall motion abnormalities, grade 1 diastolic dysfunction.  Right ventricular systolic function normal.  Left atrium mildly dilated. It also showed a small pericardial effusion anterior to the right ventricle. More concerning finding is an echodensity adjacent to the LVOT side of the noncoronary leaflet on the aortic valve-while this may represent  sclerosis or artifact, a valvular mass/vegetation cannot be excluded  in the setting of recurrent strokes.  Imaging I have reviewed the images obtained:  CT-head-N/A ICP  MRI examination of the brain-acute left pontine infarct.  Mild edema without mass effect.  Adjacent remote right pontine infarct.  Right midbrain volume degeneration.  Also remote bilateral lacunar infarcts in the basal ganglia, thalami and borderline corona radiata.  Small remote cerebellar infarcts.  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 The location of the stroke is more consistent with a stroke of small vessel etiology but given his atrial fibrillation and also now the question of a possible aortic valve vegetation, cardioembolic source definitely needs to be evaluated and ruled out.  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 TEE per cardiology Due to deranged renal function, I would avoid CT angiography and order carotid Dopplers for neck vessels and MRA of the head without contrast for evaluation of intracranial vasculature.  Evaluation of intracranial vasculature is important-generally as a part of stroke work-up but in his case more so to rule out mycotic aneurysms, if there is a concern for infective endocarditis.  This would be an important consideration while making decisions to start anticoagulation. A1c at goal-on 80 of Lipitor at home-continue. PT, OT, speech  therapy Blood pressure goal: Given that he is now day 3 or so after the last known well, no need for permissive hypertension from a neurological standpoint.  Plan discussed with Dr. Denton Lank -- Milon Dikes, MD Neurologist [facility] Pager: [phone]

## 2022-04-22 NOTE — Assessment & Plan Note (Signed)
See acute CVA

## 2022-04-22 NOTE — Evaluation (Signed)
Occupational Therapy Evaluation Patient Details Name: Evan BUTTERY Sr. MRN: [id number] DOB: 1956/05/14 Today's Date: 04/22/2022   History of Present Illness Evan HILLMER Sr. is a 66 y.o. male with medical history significant for CVA with left-sided hemiparesis, history of paroxysmal A-fib, hypertension, dyslipidemia, diabetes mellitus, diastolic dysfunction CHF who was brought into the ER via private vehicle for evaluation of new onset right-sided weakness that started about noon on the day prior to his admission associated with inability to ambulate and a fall. 04/21/22 Brain MRI: Acute left pontine infarct.   Clinical Impression   Evan Townsend was seen for OT evaluation this date. Prior to hospital admission, pt was MOD I for mobility and PRN assist for ADLs. Pt lives with spouse in home c ramped entrance. Pt presents to acute OT demonstrating impaired ADL performance and functional mobility 2/2 decreased activity tolerance and functional strength/ROM/balance deficits. Pt currently requires CGA + HHA bed>chair SPT, pt requests to trial hemiwalker. CGA + HW ~30 ft functional mobility. SETUP don/doff gown and self-feeding in sitting. Pt would benefit from skilled OT to address noted impairments and functional limitations (see below for any additional details). Upon hospital discharge, recommend HHOT to maximize pt safety and return to PLOF.    Recommendations for follow up therapy are one component of a multi-disciplinary discharge planning process, led by the attending physician.  Recommendations may be updated based on patient status, additional functional criteria and insurance authorization.   Follow Up Recommendations  Home health OT    Assistance Recommended at Discharge Intermittent Supervision/Assistance  Patient can return home with the following A little help with walking and/or transfers;A little help with bathing/dressing/bathroom;Help with stairs or ramp for entrance    Functional  Status Assessment  Patient has had a recent decline in their functional status and demonstrates the ability to make significant improvements in function in a reasonable and predictable amount of time.  Equipment Recommendations  BSC/3in1    Recommendations for Other Services       Precautions / Restrictions Precautions Precautions: Fall Restrictions Weight Bearing Restrictions: No      Mobility Bed Mobility Overal bed mobility: Needs Assistance Bed Mobility: Supine to Sit     Supine to sit: Supervision, HOB elevated          Transfers Overall transfer level: Needs assistance Equipment used: Hemi-walker Transfers: Sit to/from Stand Sit to Stand: Min guard           General transfer comment: SBA, fair eccentric control      Balance Overall balance assessment: Needs assistance Sitting-balance support: No upper extremity supported, Feet supported Sitting balance-Leahy Scale: Normal     Standing balance support: No upper extremity supported, During functional activity Standing balance-Leahy Scale: Fair                             ADL either performed or assessed with clinical judgement   ADL Overall ADL's : Needs assistance/impaired                                       General ADL Comments: CGA + HW for simulated toilet t/f. SETUP don/doff gown in sitting. SETUP self-feeding in sitting      Pertinent Vitals/Pain Pain Assessment Pain Assessment: No/denies pain     Hand Dominance Right   Extremity/Trunk Assessment Upper Extremity Assessment Upper Extremity  Assessment: LUE deficits/detail;RUE deficits/detail RUE Deficits / Details: 4+/5 grossly LUE Deficits / Details: LUE contracture, hx CVA           Communication Communication Communication: Expressive difficulties   Cognition Arousal/Alertness: Awake/alert Behavior During Therapy: WFL for tasks assessed/performed Overall Cognitive Status: Within Functional  Limits for tasks assessed                                                  Home Living Family/patient expects to be discharged to:: Private residence Living Arrangements: Spouse/significant other Available Help at Discharge: Family;Available PRN/intermittently Type of Home: House Home Access: Ramped entrance     Home Layout: One level     Bathroom Shower/Tub: Chief Strategy Officer: Handicapped height     Home Equipment: Pharmacist, hospital (2 wheels);Other (comment) (lofstrand crutches)          Prior Functioning/Environment Prior Level of Function : Independent/Modified Independent             Mobility Comments: use of lofstrand crutches, assist from wife for trasnfers recently          OT Problem List: Decreased strength;Decreased range of motion;Decreased activity tolerance;Impaired balance (sitting and/or standing);Decreased safety awareness;Impaired UE functional use      OT Treatment/Interventions: Self-care/ADL training;Therapeutic exercise;Energy conservation;DME and/or AE instruction;Therapeutic activities;Patient/family education;Balance training    OT Goals(Current goals can be found in the care plan section) Acute Rehab OT Goals Patient Stated Goal: to walk better OT Goal Formulation: With patient Time For Goal Achievement: 05/06/22 Potential to Achieve Goals: Good ADL Goals Pt Will Perform Grooming: with modified independence;standing Pt Will Perform Lower Body Dressing: with modified independence;sit to/from stand Pt Will Transfer to Toilet: with modified independence;ambulating;regular height toilet  OT Frequency: Min 3X/week    Co-evaluation              AM-PAC OT "6 Clicks" Daily Activity     Outcome Measure Help from another person eating meals?: None Help from another person taking care of personal grooming?: A Little Help from another person toileting, which includes using toliet, bedpan, or  urinal?: A Little Help from another person bathing (including washing, rinsing, drying)?: A Little Help from another person to put on and taking off regular upper body clothing?: None Help from another person to put on and taking off regular lower body clothing?: A Little 6 Click Score: 20   End of Session Equipment Utilized During Treatment: Gait belt Nurse Communication: Mobility status  Activity Tolerance: Patient tolerated treatment well Patient left: in chair;with call bell/phone within reach;with chair alarm set  OT Visit Diagnosis: Other abnormalities of gait and mobility (R26.89);Muscle weakness (generalized) (M62.81)                Time: 2778-2423 OT Time Calculation (min): 23 min Charges:  OT General Charges $OT Visit: 1 Visit OT Evaluation $OT Eval Moderate Complexity: 1 Mod OT Treatments $Self Care/Home Management : 8-22 mins  Kathie Dike, M.S. OTR/L  04/22/22, 12:35 PM  ascom [phone]

## 2022-04-22 NOTE — Assessment & Plan Note (Addendum)
MRI brain with acute Left pontine infarct.  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   --Neurology consulted --Cardiology recommended for TEE which was negative --Telemetry - no significant events seen --MRA head --Carotid dopplers - moderate R ICA plaque progressed since 08/2018 but not hemodynamically significant, minimal on the left --PT/OT/SLP evaluated -- recommended and set up for [facility] --Goal normal BP  --Continue Lipitor  --Resume Eliquis

## 2022-04-22 NOTE — Evaluation (Addendum)
Clinical/Bedside Swallow Evaluation Patient Details  Name: Evan SINE Sr. MRN: [id number] Date of Birth: [birth year]  Today's Date: 04/22/2022 Time: SLP Start Time (ACUTE ONLY): 0850 SLP Stop Time (ACUTE ONLY): 0915 SLP Time Calculation (min) (ACUTE ONLY): 25 min  Past Medical History:  Past Medical History:  Diagnosis Date   Carotid arterial disease (HCC)    a. 08/2018 Carotid U/S: min-mod RICA atherosclerosis w/o hemodynamically significant stenosis. Nl LICA.   Diabetes 1.5, managed as type 2 (HCC)    Diastolic dysfunction    a. 08/2018 Echo: EF 65%. No rwma. Gr1 DD. Mild MR.   Diastolic dysfunction    a. 08/2019 Echo: EF 55-60%, Gr1 DD. No rwma. Mild MR. RVSP 37.79mmHg.   Hypercholesterolemia    Hypertension    PAF (paroxysmal atrial fibrillation) (HCC)    a. 10/2019 Event Monitor: PAF; b. CHA2DS2VASc = 5-->Eliquis.   Poorly controlled diabetes mellitus (HCC)    a. 04/2019 A1c 13.8.   Recurrent strokes (HCC)    a. 10/2016 MRI/A: Acute 108mm R thalamic infarct, ? subacute infarct of R corona radiata; b. 08/2017 MRI/A: Acute 27mm lateral L thalamic infarct. Other more remote lacunar infarcts of thalami bilat. Small vessel dzs; c. 08/2018 MRI/A: Acute lacunar infarct of the post limb of R internal capsule; d. 08/2019 MRI Acute CVA of L paramedian pons adn R cerebellar hemisphere.   Tobacco abuse    Past Surgical History:  Past Surgical History:  Procedure Laterality Date   ESOPHAGOGASTRODUODENOSCOPY (EGD) WITH PROPOFOL N/A 04/26/2019   Procedure: ESOPHAGOGASTRODUODENOSCOPY (EGD) WITH PROPOFOL;  Surgeon: Toney Reil, MD;  Location: [facility] ENDOSCOPY;  Service: Gastroenterology;  Laterality: N/A;   LEFT HEART CATH AND CORONARY ANGIOGRAPHY N/A 01/09/2022   Procedure: LEFT HEART CATH AND CORONARY ANGIOGRAPHY;  Surgeon: [name], MD;  Location: [facility] INVASIVE CV LAB;  Service: Cardiovascular;  Laterality: N/A;   NO PAST SURGERIES     HPI:  Evan SHIRAISHI Sr. is a 66 y.o. male with  medical history significant for CVA with left-sided hemiparesis, history of paroxysmal A-fib, hypertension, dyslipidemia, diabetes mellitus, diastolic dysfunction CHF who was brought into the ER via private vehicle for evaluation of new onset right-sided weakness that started about noon on the day prior to his admission associated with inability to ambulate and a fall. MRU 04/21/22: Acute left pontine infarct.  Mild edema without mass effect.  Remote infarcts. Spouse in the room for assessment and reporting that pt was regular solids and thin liquids prior to this admission to the hospital. Pt currently NPO following failing [facility] Swallow screen.    Assessment / Plan / Recommendation  Clinical Impression  Clinical Swallow Assessment:  Pt presents with suspected oropharyngeal dysphagia in the setting of acute pontine infarct, with additional hx of prior stroke and dysphagia. Pt with immediate cough with thin liquids via straw. Delayed cough x1 with nectar thick via straw, not repeated with additional trials. Oral phase notable for impaired mastication and coordination with regular solids and min oral residue. No dentures present for assessment- directly impacting mastication. Based on hx of dysphagia (specifically silent aspiration of thin liquids) and acute pontine infarct pt is at increased risk of aspiration. Plan to complete MBSS for further assessment of oropharyngeal swallow. Recommend initiation of Nectar thick liquids and Dys 3 nectar solids with strict aspiration precautions (slow rate, small bites, elevated HOB, and alert for PO intake). Education shared with pt regarding recommendations and rationale for altered diet. RN and MD in agreement with plan. SLP  to follow up.   Motor Speech Assessment:  Pt presents with moderate dysarthria, characterized by reduced vocal intensity, imprecise articulation, slowed speech rate, and hoarse/rough vocal quality. Suspect some degree of dysarthria at baseline, which  is not exacerbated. Reduced left labial strength/symmetry and reduced left lingual lateralization impacting speech production. Verbal prompts bolstering volume with re-trial. Spouse endorsing that speech is mort altered than baseline. Pt endorsed that he is aware of change in speech clarity. Pt alert, attending for duration of session, following commands, and answering questions appropriately. Pt likely to benefit from complete cognitive assessment as well as motor speech intervention in next venue of care.  SLP Visit Diagnosis: Dysphagia, oropharyngeal phase (R13.12);Dysarthria and anarthria (R47.1)    Aspiration Risk  Moderate aspiration risk    Diet Recommendation   Nectar Thick Liquids and Dys 3  Medication Administration: Whole meds with puree    Other  Recommendations Oral Care Recommendations: Oral care BID Other Recommendations: Prohibited food (jello, ice cream, thin soups);Remove water pitcher    Recommendations for follow up therapy are one component of a multi-disciplinary discharge planning process, led by the attending physician.  Recommendations may be updated based on patient status, additional functional criteria and insurance authorization.  Follow up Recommendations Skilled nursing-short term rehab (<3 hours/day)      Assistance Recommended at Discharge Set up Supervision/Assistance  Functional Status Assessment Patient has had a recent decline in their functional status and demonstrates the ability to make significant improvements in function in a reasonable and predictable amount of time.  Frequency and Duration min 2x/week  2 weeks       Prognosis Prognosis for Safe Diet Advancement: Good Barriers to Reach Goals: Severity of deficits      Swallow Study   General HPI: Evan PINSON Sr. is a 66 y.o. male with medical history significant for CVA with left-sided hemiparesis, history of paroxysmal A-fib, hypertension, dyslipidemia, diabetes mellitus, diastolic  dysfunction CHF who was brought into the ER via private vehicle for evaluation of new onset right-sided weakness that started about noon on the day prior to his admission associated with inability to ambulate and a fall. MRU 04/21/22: Acute left pontine infarct.  Mild edema without mass effect.  Remote infarcts. Spouse in the room for assessment and reporting that pt was regular solids and thin liquids prior to this admission to the hospital. Pt currently NPO following failing [facility] Swallow screen. Type of Study: Bedside Swallow Evaluation Previous Swallow Assessment: Pt with hx of dysphagia with last MBSS completed on 09/21/2018, revealing "moderate sensorimotor based oropharyngeal dysphagia. Patient consistently initiates his swallow at the pyriform sinuses resulting in both intermittent sensed and silent aspiration of thin liquids. Recommend patient continue current diet of Dys. 3 textures with nectar-thick liquids." Diet Prior to this Study: NPO Temperature Spikes Noted: No Respiratory Status: Room air History of Recent Intubation: No Behavior/Cognition: Alert;Cooperative Oral Cavity Assessment: Within Functional Limits Oral Care Completed by SLP: Yes Oral Cavity - Dentition: Edentulous (dentures not present) Vision: Functional for self-feeding Self-Feeding Abilities: Able to feed self;Needs set up Patient Positioning: Upright in bed Baseline Vocal Quality: Hoarse;Low vocal intensity Volitional Cough: Strong Volitional Swallow: Able to elicit    Oral/Motor/Sensory Function Overall Oral Motor/Sensory Function: Moderate impairment Facial ROM: Reduced left Facial Symmetry: Abnormal symmetry left Facial Strength: Reduced left Lingual ROM: Reduced left Lingual Symmetry: Abnormal symmetry left Lingual Strength: Reduced Velum: Within Functional Limits Mandible: Within Functional Limits   Ice Chips Ice chips: Within functional limits Presentation: Spoon  Thin Liquid Thin Liquid:  Impaired Presentation: Straw;Cup Pharyngeal  Phase Impairments: Cough - Immediate    Nectar Thick Nectar Thick Liquid: Impaired Presentation: Cup Pharyngeal Phase Impairments: Cough - Delayed   Honey Thick Honey Thick Liquid: Not tested   Puree Puree: Within functional limits   Solid     Solid: Impaired Presentation: Self Fed Oral Phase Impairments: Impaired mastication Oral Phase Functional Implications: Prolonged oral transit;Impaired mastication     Evan Aydan Phoenix Clapp MS CCC-SLP  Evan J Townsend 04/22/2022,11:05 AM

## 2022-04-22 NOTE — Progress Notes (Signed)
*  PRELIMINARY RESULTS* Echocardiogram Echocardiogram Transesophageal has been performed.  Cristela Blue 04/22/2022, 1:28 PM

## 2022-04-22 NOTE — Evaluation (Signed)
Physical Therapy Evaluation Patient Details Name: Evan STEPHENS Sr. MRN: [id number] DOB: 04-Sep-1955 Today's Date: 04/22/2022  History of Present Illness  Evan LACK Sr. is a 66 y.o. male with medical history significant for CVA with left-sided hemiparesis, history of paroxysmal A-fib, hypertension, dyslipidemia, diabetes mellitus, diastolic dysfunction CHF who was brought into the ER via private vehicle for evaluation of new onset right-sided weakness that started about noon on the day prior to his admission associated with inability to ambulate and a fall. 04/21/22 Brain MRI: Acute left pontine infarct.  Clinical Impression  Patient seated in recliner upon arrival to session; recently completed OT evaluation.  Alert and oriented to basic information; follows simple commands and agreeable to participation with session.  Effectively communicates basic wants/needs with increased time/efforts; baseline dysarthria evident.  Denies pain throughout; endorses improved movement and control of R hemi-body since admission. Noted significant hemiparesis to L hemi-body (UE >> LE), with moderate flexion contrature to L elbow, wrist and hand.  R hemi-body grossly 4+/5 throughout, functional for basic transfers and gait activities.  Currently requiring min assist for sit/stand, basic transfers and gait; prefers use of HW for optimal safety/indep. Gait pattern (25' with HW), min assist - 3-point, step to gait pattern; exaggerated R lateral lean/weight shift to unweight and advance L LE; inconsistent L foot clearance. Min cuing for [facility] position and use during gait trial to maximize safety.  Do recommend continued use of HW and +1 assist for safety with all gait efforts at this time   Would benefit from skilled PT to address above deficits and promote optimal return to PLOF; Recommend transition to HHPT upon discharge from acute hospitalization.      Recommendations for follow up therapy are one component of a  multi-disciplinary discharge planning process, led by the attending physician.  Recommendations may be updated based on patient status, additional functional criteria and insurance authorization.  Follow Up Recommendations Home health PT      Assistance Recommended at Discharge Frequent or constant Supervision/Assistance  Patient can return home with the following  A little help with walking and/or transfers;A little help with bathing/dressing/bathroom;Help with stairs or ramp for entrance    Equipment Recommendations  (hemiwalker)  Recommendations for Other Services       Functional Status Assessment Patient has had a recent decline in their functional status and demonstrates the ability to make significant improvements in function in a reasonable and predictable amount of time.     Precautions / Restrictions Precautions Precautions: Fall Restrictions Weight Bearing Restrictions: No      Mobility  Bed Mobility               General bed mobility comments: seated in recliner beginning/end of treatment session    Transfers Overall transfer level: Needs assistance Equipment used: Lofstrands Transfers: Sit to/from Stand Sit to Stand: Min guard, Min assist           General transfer comment: multiple attempts to complete sit/stand; definite need for R UE support with transitional movement.  Limited active use of L LE with initial efforts (residual CVA effects)    Ambulation/Gait Ambulation/Gait assistance: Min assist Gait Distance (Feet): 5 Feet Assistive device: Lofstrands         General Gait Details: 3-point, step to gait pattern; exaggerated R lateral lean/weight shift to unweight and advance L LE; increased sway in all planes.  Patient feeling generally unsteady/unsafe, and requesting trial of HW for additional support.  Stairs  Wheelchair Mobility    Modified Rankin (Stroke Patients Only)       Balance Overall balance assessment:  Needs assistance Sitting-balance support: No upper extremity supported, Feet supported Sitting balance-Leahy Scale: Good     Standing balance support: Single extremity supported Standing balance-Leahy Scale: Fair                               Pertinent Vitals/Pain Pain Assessment Pain Assessment: No/denies pain    Home Living Family/patient expects to be discharged to:: Private residence Living Arrangements: Spouse/significant other Available Help at Discharge: Family;Available PRN/intermittently Type of Home: House Home Access: Ramped entrance;Stairs to enter Entrance Stairs-Rails: Right;Left Entrance Stairs-Number of Steps: 3, ramp in the back pt uses   Home Layout: One level Home Equipment: Pharmacist, hospital (2 wheels);Other (comment) (loftstrand crutch)      Prior Function Prior Level of Function : Independent/Modified Independent             Mobility Comments: use of lofstrand crutch with transfers and gait, endorses in home and some community mobility       Hand Dominance   Dominant Hand: Right    Extremity/Trunk Assessment   Upper Extremity Assessment Upper Extremity Assessment: LUE deficits/detail;RUE deficits/detail RUE Deficits / Details: 4+/5 grossly LUE Deficits / Details: LUE contracture with flexed position of elbow, wrist and hand (residual from prior CVA)    Lower Extremity Assessment Lower Extremity Assessment: RLE deficits/detail;LLE deficits/detail RLE Deficits / Details: grossly 4+/5 throughout LLE Deficits / Details: grossly 3-/5 throughout; inconsistent activation of L ankle DF (ROM to neutral with stretching, WBing)       Communication   Communication: Expressive difficulties (baseline dysarthria)  Cognition Arousal/Alertness: Awake/alert Behavior During Therapy: WFL for tasks assessed/performed Overall Cognitive Status: Within Functional Limits for tasks assessed                                           General Comments      Exercises Other Exercises Other Exercises: 69' with HW, min assist - 3-point, step to gait pattern; exaggerated R lateral lean/weight shift to unweight and advance L LE; inconsistent L foot clearance. Min cuing for [facility] position and use during gait trial to maximize safety.  Do recommend continued use of HW and +1 assist for safety with all gait efforts at this time   Assessment/Plan    PT Assessment Patient needs continued PT services  PT Problem List Decreased balance;Decreased activity tolerance;Decreased mobility;Decreased safety awareness       PT Treatment Interventions DME instruction;Gait training;Stair training;Functional mobility training;Therapeutic activities;Therapeutic exercise;Balance training;Patient/family education    PT Goals (Current goals can be found in the Care Plan section)  Acute Rehab PT Goals Patient Stated Goal: to get better and go home PT Goal Formulation: With patient Time For Goal Achievement: 05/06/22 Potential to Achieve Goals: Fair    Frequency 7X/week     Co-evaluation               AM-PAC PT "6 Clicks" Mobility  Outcome Measure Help needed turning from your back to your side while in a flat bed without using bedrails?: None Help needed moving from lying on your back to sitting on the side of a flat bed without using bedrails?: A Little Help needed moving to and from a bed to a chair (  including a wheelchair)?: A Little Help needed standing up from a chair using your arms (e.g., wheelchair or bedside chair)?: A Little Help needed to walk in hospital room?: A Little Help needed climbing 3-5 steps with a railing? : A Lot 6 Click Score: 18    End of Session Equipment Utilized During Treatment: Gait belt Activity Tolerance: Patient tolerated treatment well Patient left: in chair;with call bell/phone within reach;with chair alarm set Nurse Communication: Mobility status PT Visit Diagnosis: Muscle weakness  (generalized) (M62.81);Hemiplegia and hemiparesis;Difficulty in walking, not elsewhere classified (R26.2) Hemiplegia - caused by: Cerebral infarction    Time: 2025-4270 PT Time Calculation (min) (ACUTE ONLY): 21 min   Charges:   PT Evaluation $PT Eval Moderate Complexity: 1 Mod        Wadie Mattie H. Manson Passey, PT, DPT, NCS 04/22/22, 1:49 PM [phone]

## 2022-04-22 NOTE — Assessment & Plan Note (Signed)
Due to acute stroke most likely. SLP evaluation.

## 2022-04-22 NOTE — Assessment & Plan Note (Signed)
CBG was 62 early this AM, while patient was NPO for procedure.   --On hypoglycemia protocol --Monitor CBG's closely

## 2022-04-22 NOTE — Hospital Course (Signed)
HPI on admission:  "Evan Townsend. is a 66 y.o. male with medical history significant for CVA with left-sided hemiparesis, history of paroxysmal A-fib, hypertension, dyslipidemia, diabetes mellitus, diastolic CHF who was brought into the ER on 04/21/2022 via private vehicle for evaluation of new onset right-sided weakness that started about noon on the day prior, with inability to ambulate and a fall. Most of the history was obtained from patient's wife at the bedside and who states that at baseline he has left-sided hemiparesis but is able to ambulate with a cane.  She notes that 1 day prior to his admission he had difficulty getting around due to right-sided weakness and she also notes that he had slurred speech which she said has improved since he arrived in the ER."   Patient has had poor oral intake as well as several episodes of diarrhea... He reported appetite is poor.    CT scan of the head without contrast in the ER  showed no acute findings.  Admitted to medicine service for further evaluation and management.  Neurology and Cardiology consulted.

## 2022-04-22 NOTE — Progress Notes (Signed)
Progress Note   Patient: Evan Townsend NWG:[id number] DOB: [birth year] DOA: 04/21/2022     0 DOS: the patient was seen and examined on 04/22/2022   Brief hospital course: HPI on admission:  "Evan Neal. is a 66 y.o. male with medical history significant for CVA with left-sided hemiparesis, history of paroxysmal A-fib, hypertension, dyslipidemia, diabetes mellitus, diastolic CHF who was brought into the ER on 04/21/2022 via private vehicle for evaluation of new onset right-sided weakness that started about noon on the day prior, with inability to ambulate and a fall. Most of the history was obtained from patient's wife at the bedside and who states that at baseline he has left-sided hemiparesis but is able to ambulate with a cane.  She notes that 1 day prior to his admission he had difficulty getting around due to right-sided weakness and she also notes that he had slurred speech which she said has improved since he arrived in the ER."   Patient has had poor oral intake as well as several episodes of diarrhea... He reported appetite is poor.    CT scan of the head without contrast in the ER  showed no acute findings.  Admitted to medicine service for further evaluation and management.  Neurology and Cardiology consulted.       Assessment and Plan: * Weakness Patient presents for evaluation of weakness and inability to ambulate  Has a history of a prior CVA with left-sided hemiparesis and wife is concerned about  new right-sided weakness.   Patient able to move his right upper and lower extremity but according to his wife has been unable to ambulate. Generalized weakness may be secondary to dehydration from poor oral intake. MRI of the brain showed acute Left pontine stroke. --Neurology following --PT/OT evaluations --Fall precautions --Mgmt as outlined above/below  Acute CVA (cerebrovascular accident) (HCC) MRI brain with acute Left pontine infarct.  Presented with right-sided  weakness superimposed on his baseline spastic left hemiparesis from prior stroke.   --Neurology following --Cardiology for TEE today --Telemetry --MRA head --Carotid dopplers --PT/OT/SLP evaluations --No more need for permissive HTN --Continue Lipitor  --Hold Eliquis, resume tomorrow  Recurrent strokes (HCC) Patient has a history of recurrent strokes with left-sided hemiparesis. Now with acute left pontine stroke. --Continue aggressive risk factor modification --See acute CVA for mgmt  Uncontrolled type 2 diabetes mellitus with hyperglycemia, with long-term current use of insulin (HCC) With hypoglycemia during this admission.   --Home meds are on hold --Monitor CBG's --Start sliding scale Novolog if/when CBG's above 140  Hypoglycemia CBG was 62 early this AM, while patient was NPO for procedure.   --On hypoglycemia protocol --Monitor CBG's closely  Acute ischemic stroke [facility]) See acute CVA  Slurred speech Due to acute stroke most likely. SLP evaluation.  CKD stage 3 due to type 2 diabetes mellitus (HCC) CKD stage IIIa Renal function appears stable Monitor closely  Paroxysmal atrial fibrillation (HCC) Eliquis on hold, resume tomorrow per neurology.  Not on rate control agent likely due to bradycardia. --Cardiology following   Primary hypertension Resume amlodipine at 5 mg for now  Moderate major depression (HCC) Continue Remeron Patient may need an increase in his dose to also help with his poor appetite        Subjective: Pt awake resting in bed, seen after procedure today.  He is minimally verbal for me.  Denies pain, headache, chest pain, SOB or other complaints.  No acute events reported.    Physical Exam: Vitals:  04/22/22 1330 04/22/22 1345 04/22/22 1400 04/22/22 1613  BP: (!) 155/75 (!) 142/78 (!) 155/74 (!) 144/74  Pulse: 61 (!) 50 62 (!) 55  Resp: (!) 7 (!) 9 14 17   Temp:   97.9 F (36.6 C) 97.9 F (36.6 C)  TempSrc:   Oral   SpO2: 100%  100%    Weight:      Height:       General exam: awake, alert, no acute distress HEENT: amoist mucus membranes, hearing grossly normal  Respiratory system: CTAB, no wheezes, rales or rhonchi, normal respiratory effort. Cardiovascular system: normal S1/S2, RRR, no pedal edema.   Gastrointestinal system: soft, NT, ND Central nervous system: contracted LUE, RUE weak, lower extremities not tested, minimally verbal Skin: dry, intact, normal temperature Psychiatry: normal mood, congruent affect    Data Reviewed:  Notable labs --- glucose  62 >> 107 >> 78.  Normal lipid panel LDL 63.  Family Communication: None at bedside  Disposition: Status is: Inpatient Remains inpatient appropriate because: Ongoing evaluation not appropriate for outpatient setting.    Planned Discharge Destination: Skilled nursing facility vs [facility]    Time spent: 45 minutes  Author: [facility], DO 04/22/2022 7:52 PM  For on call review [email or web address].

## 2022-04-23 ENCOUNTER — Encounter: Payer: Self-pay | Admitting: Cardiology

## 2022-04-23 ENCOUNTER — Other Ambulatory Visit: Payer: Self-pay

## 2022-04-23 DIAGNOSIS — I639 Cerebral infarction, unspecified: Secondary | ICD-10-CM | POA: Diagnosis not present

## 2022-04-23 LAB — CBC
HCT: 32 % — ABNORMAL LOW (ref 39.0–52.0)
Hemoglobin: 10.1 g/dL — ABNORMAL LOW (ref 13.0–17.0)
MCH: 26.6 pg (ref 26.0–34.0)
MCHC: 31.6 g/dL (ref 30.0–36.0)
MCV: 84.2 fL (ref 80.0–100.0)
Platelets: 257 10*3/uL (ref 150–400)
RBC: 3.8 MIL/uL — ABNORMAL LOW (ref 4.22–5.81)
RDW: 18.7 % — ABNORMAL HIGH (ref 11.5–15.5)
WBC: 5.6 10*3/uL (ref 4.0–10.5)
nRBC: 0 % (ref 0.0–0.2)

## 2022-04-23 LAB — BASIC METABOLIC PANEL
Anion gap: 9 (ref 5–15)
BUN: 22 mg/dL (ref 8–23)
CO2: 21 mmol/L — ABNORMAL LOW (ref 22–32)
Calcium: 9.2 mg/dL (ref 8.9–10.3)
Chloride: 113 mmol/L — ABNORMAL HIGH (ref 98–111)
Creatinine, Ser: 1.58 mg/dL — ABNORMAL HIGH (ref 0.61–1.24)
GFR, Estimated: 48 mL/min — ABNORMAL LOW (ref >60)
Glucose, Bld: 84 mg/dL (ref 70–99)
Potassium: 3.6 mmol/L (ref 3.5–5.1)
Sodium: 143 mmol/L (ref 135–145)

## 2022-04-23 LAB — GLUCOSE, CAPILLARY
Glucose-Capillary: 74 mg/dL (ref 70–99)
Glucose-Capillary: 166 mg/dL — ABNORMAL HIGH (ref 70–99)

## 2022-04-23 MED ORDER — APIXABAN 5 MG PO TABS
5.0000 mg | ORAL_TABLET | Freq: Two times a day (BID) | ORAL | Status: DC
  Administered 2022-04-23: 5 mg via ORAL
  Filled 2022-04-23: qty 1

## 2022-04-23 MED ORDER — MELATONIN 1 MG PO TABS: 3.0000 mg | ORAL_TABLET | Freq: Every evening | ORAL | 4 refills | Status: DC | PRN

## 2022-04-23 MED ORDER — LOSARTAN POTASSIUM 25 MG PO TABS
25.0000 mg | ORAL_TABLET | Freq: Every day | ORAL | Status: DC
Start: 2022-04-23 — End: 2022-04-23

## 2022-04-23 MED ORDER — MELATONIN 1 MG PO TABS: 1.0000 mg | ORAL_TABLET | Freq: Every day | ORAL | 4 refills | Status: DC

## 2022-04-23 MED ORDER — AMLODIPINE BESYLATE 10 MG PO TABS: 10.0000 mg | ORAL_TABLET | Freq: Every day | ORAL | Status: DC

## 2022-04-23 MED ORDER — HYDRALAZINE HCL 20 MG/ML IJ SOLN: 10.0000 mg | Freq: Four times a day (QID) | INTRAMUSCULAR | Status: DC | PRN

## 2022-04-23 MED ORDER — LOSARTAN POTASSIUM 25 MG PO TABS: 25.0000 mg | ORAL_TABLET | Freq: Every day | ORAL | 1 refills | Status: DC

## 2022-04-23 NOTE — Progress Notes (Signed)
Call pt spouse pt d/c need to know desired route (PMV or EMS). No answer, message left. I have not received call back at this point.

## 2022-04-23 NOTE — TOC Progression Note (Addendum)
Transition of Care (TOC) - Progression Note    Patient Details  Name: Evan MEACHAM Sr. MRN: [id number] Date of Birth: 1955/10/07  Transition of Care [facility]) CM/SW Contact  Truddie Hidden, RN Phone Number: 04/23/2022, 3:07 PM  Clinical Narrative:    Attempt to contact patient's wife to discuss discharge plan for home with [facility] PT/ OT/ SLP. Daughter answered phone and request cal back. Will reattempt.   Spoke with patient's wife. She is agreeable to home health. No preference stated. Referral sent and accepted by Evan Townsend of [facility] for PT/OT DME requested for hemiwalker and 3and1. Wife will transport patient home. TOC signing off.         Expected Discharge Plan and Services           Expected Discharge Date: 04/23/22                                     Social Determinants of Health (SDOH) Interventions    Readmission Risk Interventions     No data to display

## 2022-04-23 NOTE — Progress Notes (Signed)
Neurology Progress Note   S:// Seen and examined TEE with no evidence of thrombus or vegetation.  TTE finding likely artifactual.   O:// Current vital signs: BP (!) 156/84 (BP Location: Right Arm)   Pulse (!) 59   Temp 97.9 F (36.6 C) (Oral)   Resp 14   Ht 5\' 5"  (1.651 m)   Wt 65.1 kg   SpO2 100%   BMI 23.88 kg/m  Vital signs in last 24 hours: Temp:  [97.6 F (36.4 C)-98 F (36.7 C)] 97.9 F (36.6 C) (09/07 0419) Pulse Rate:  [50-82] 59 (09/07 0824) Resp:  [7-20] 14 (09/07 0824) BP: (134-175)/(72-89) 156/84 (09/07 0824) SpO2:  [96 %-100 %] 100 % (09/07 0824) General: Awake alert in no distress HEENT: Normocephalic atraumatic Lungs: Clear Cardiovascular: Irregularly irregular Abdomen nondistended nontender Extremities warm well perfused Neurological exam He is awake alert oriented x3 His speech is moderate to severely dysarthric There is no evidence of aphasia Naming intact Repetition intact Comprehension intact Cranial nerves: Pupils are equal round react light, extraocular movements are unhindered, visual fields are full, facial sensation is intact, face appears grossly symmetric with maybe a subtle left lower facial weakness but the family did not seem to think that his face was any different than baseline, auditory acuity intact, tongue and palate midline. Motor examination left spastic hemiparesis, right side with no vertical drift but subjectively feels weaker. Sensation intact Coordination with mild dysmetria on the right.  Unable to perform on the left.  Unchanged neurological exam from yesterday  Medications  Current Facility-Administered Medications:    0.9 %  sodium chloride infusion, , Intravenous, Continuous, Hammock, Sheri, NP   acetaminophen (TYLENOL) tablet 650 mg, 650 mg, Oral, Q4H PRN **OR** acetaminophen (TYLENOL) 160 MG/5ML solution 650 mg, 650 mg, Per Tube, Q4H PRN **OR** acetaminophen (TYLENOL) suppository 650 mg, 650 mg, Rectal, Q4H PRN,  Agbata, Tochukwu, MD   albuterol (PROVENTIL) (2.5 MG/3ML) 0.083% nebulizer solution 3 mL, 3 mL, Inhalation, Q6H PRN, Agbata, Tochukwu, MD   [START ON 04/24/2022] amLODipine (NORVASC) tablet 10 mg, 10 mg, Oral, Daily, 06/24/2022, Kelly A, DO   apixaban (ELIQUIS) tablet 5 mg, 5 mg, Oral, BID, Denton Lank, Kelly A, DO   atorvastatin (LIPITOR) tablet 80 mg, 80 mg, Oral, QPM, Agbata, Tochukwu, MD, 80 mg at 04/22/22 1613   hydrALAZINE (APRESOLINE) injection 10 mg, 10 mg, Intravenous, Q6H PRN, 06/22/22 A, DO   iron polysaccharides (NIFEREX) capsule 150 mg, 150 mg, Oral, Daily, Agbata, Tochukwu, MD, 150 mg at 04/23/22 0837   melatonin tablet 2.5 mg, 2.5 mg, Oral, QHS, Agbata, Tochukwu, MD, 2.5 mg at 04/22/22 2028   metoCLOPramide (REGLAN) tablet 5 mg, 5 mg, Oral, TID AC, Agbata, Tochukwu, MD, 5 mg at 04/23/22 0837   mirtazapine (REMERON) tablet 7.5 mg, 7.5 mg, Oral, QHS, Agbata, Tochukwu, MD, 7.5 mg at 04/22/22 2028   nitroGLYCERIN (NITROSTAT) SL tablet 0.4 mg, 0.4 mg, Sublingual, Q5 min PRN, Agbata, Tochukwu, MD   pantoprazole (PROTONIX) EC tablet 40 mg, 40 mg, Oral, Daily, Agbata, Tochukwu, MD, 40 mg at 04/23/22 0837   senna-docusate (Senokot-S) tablet 1 tablet, 1 tablet, Oral, QHS PRN, Agbata, Tochukwu, MD   tamsulosin (FLOMAX) capsule 0.4 mg, 0.4 mg, Oral, QPM, Agbata, Tochukwu, MD, 0.4 mg at 04/22/22 1613 Labs CBC    Component Value Date/Time   WBC 5.6 04/23/2022 0415   RBC 3.80 (L) 04/23/2022 0415   HGB 10.1 (L) 04/23/2022 0415   HGB 9.0 (L) 01/06/2022 1319   HCT 32.0 (L) 04/23/2022  0415   HCT 26.9 (L) 01/06/2022 1319   PLT 257 04/23/2022 0415   PLT 206 01/06/2022 1319   MCV 84.2 04/23/2022 0415   MCV 85 01/06/2022 1319   MCV 88 03/15/2013 1649   MCH 26.6 04/23/2022 0415   MCHC 31.6 04/23/2022 0415   RDW 18.7 (H) 04/23/2022 0415   RDW 15.8 (H) 01/06/2022 1319   RDW 14.5 03/15/2013 1649   LYMPHSABS 2.0 04/21/2022 0930   LYMPHSABS 1.1 01/06/2022 1319   LYMPHSABS 2.7 03/15/2013 1649    MONOABS 0.5 04/21/2022 0930   MONOABS 0.5 03/15/2013 1649   EOSABS 0.1 04/21/2022 0930   EOSABS 0.0 01/06/2022 1319   EOSABS 0.1 03/15/2013 1649   BASOSABS 0.0 04/21/2022 0930   BASOSABS 0.0 01/06/2022 1319   BASOSABS 0.1 03/15/2013 1649    CMP     Component Value Date/Time   NA 143 04/23/2022 0415   NA 138 01/06/2022 1319   NA 135 (L) 03/15/2013 1649   K 3.6 04/23/2022 0415   K 4.3 03/15/2013 1649   CL 113 (H) 04/23/2022 0415   CL 97 (L) 03/15/2013 1649   CO2 21 (L) 04/23/2022 0415   CO2 30 03/15/2013 1649   GLUCOSE 84 04/23/2022 0415   GLUCOSE 372 (H) 03/15/2013 1649   BUN 22 04/23/2022 0415   BUN 22 01/06/2022 1319   BUN 21 (H) 03/15/2013 1649   CREATININE 1.58 (H) 04/23/2022 0415   CREATININE 1.38 (H) 05/16/2020 1212   CALCIUM 9.2 04/23/2022 0415   CALCIUM 9.8 03/15/2013 1649   PROT 8.1 04/21/2022 0930   PROT 6.7 01/06/2022 1319   PROT 7.9 03/15/2013 1649   ALBUMIN 3.7 04/21/2022 0930   ALBUMIN 4.0 01/06/2022 1319   ALBUMIN 4.0 03/15/2013 1649   AST 29 04/21/2022 0930   AST 11 (L) 03/15/2013 1649   ALT 47 (H) 04/21/2022 0930   ALT 31 03/15/2013 1649   ALKPHOS 164 (H) 04/21/2022 0930   ALKPHOS 131 03/15/2013 1649   BILITOT 0.7 04/21/2022 0930   BILITOT <0.2 01/06/2022 1319   BILITOT 0.2 03/15/2013 1649   GFRNONAA 48 (L) 04/23/2022 0415   GFRNONAA 54 (L) 05/16/2020 1212   GFRAA 75 10/03/2020 1142   GFRAA 63 05/16/2020 1212    Lipid Panel     Component Value Date/Time   CHOL 128 04/22/2022 0519   CHOL 134 11/12/2021 1532   TRIG 91 04/22/2022 0519   HDL 47 04/22/2022 0519   HDL 53 11/12/2021 1532   CHOLHDL 2.7 04/22/2022 0519   VLDL 18 04/22/2022 0519   LDLCALC 63 04/22/2022 0519   LDLCALC 52 11/12/2021 1532   LDLCALC 65 02/13/2020 1417   A1c 7.0 LDL 63 TEE  with no evidence of vegetation or thrombus. Transthoracic echo with LVEF 55 to 60%, grade 1 diastolic dysfunction, mild LA dilatation.  Small pericardial effusion anterior to the right  ventricle.   Imaging I have reviewed images in epic and the results pertinent to this consultation are: CT-head-N/A ICP   MRI examination of the brain-acute left pontine infarct.  Mild edema without mass effect.  Adjacent remote right pontine infarct.  Right midbrain volume degeneration.  Also remote bilateral lacunar infarcts in the basal ganglia, thalami and borderline corona radiata.  Small remote cerebellar infarcts.  MRI angio head with no large or medium vessel occlusion.  Intracranial atherosclerosis is seen.  Posterior circulation shows intracranial vertebral arteries patent to the basilar with left vertebral artery being dominant.  Patent PICA and SCA origin seems  bilaterally.  Basilar is widely patent.  There is robust P-comm arteries.  The right P1 segment is mildly hypoplastic and there is an apparent moderate to severe stenosis proximally which is more prominent on the prior CTA but is similar to the prior MRI and may be partly technical.  There is PCA branch vessel irregularity bilaterally and there is chronic severe left P3 branch stenosis.  No aneurysm identified.  Carotid duplex with moderate amount of right-sided atherosclerotic plaque that has morphologically progressed compared to January 2020 exam though not resulting in hemodynamically significant stenosis within the right internal carotid artery.  Further evaluation by CTA recommended. Minimal amount of left-sided atherosclerotic plaque which is also progressed since 2020 though not resulting in stenosis in the left internal carotid artery.  Assessment: 66 year old with multiple cerebrovascular risk factors including atrial fibrillation, history of prior stroke with spastic left hemiparesis now with new right-sided weakness which is likely secondary to the new acute left pontine stroke seen on MRI. Location of the stroke more consistent with small vessel etiology but given his atrial fibrillation, could be embolic as well.   There was some concern for aortic valve leaflet vegetation on transthoracic echo but the TEE ruled out vegetation or thrombus. His vessel imaging reveals intracranial atherosclerosis in the posterior circulation as well as carotid Dopplers show atherosclerotic disease in the carotids-carotids appear asymptomatic given the location of the stroke at this time. Likely stroke from a combination of small vessel disease and embolic phenomenon but I would not consider this as an Eliquis failure due to abundant small vessel risk factors.   Impression: Acute ischemic stroke in the left pons-likely secondary to small vessel disease  Recommendations: Continue frequent neurochecks Eliquis can be resumed today. A1c at goal LDL at goal on Lipitor 80 at home-continue for now. PT OT speech therapy No need for permissive hypertension-goal blood pressure-normotension. Plan discussed with Dr. Laurann Montana Inpatient neurology will be available as needed   -- Amie Portland, MD Neurologist [facility] Pager: [phone]

## 2022-04-23 NOTE — Progress Notes (Signed)
Physical Therapy Treatment Patient Details Name: Evan NOVACEK Sr. MRN: [id number] DOB: Aug 21, 1955 Today's Date: 04/23/2022   History of Present Illness Evan MECHLING Sr. is a 66 y.o. male with medical history significant for CVA with left-sided hemiparesis, history of paroxysmal A-fib, hypertension, dyslipidemia, diabetes mellitus, diastolic dysfunction CHF who was brought into the ER via private vehicle for evaluation of new onset right-sided weakness that started about noon on the day prior to his admission associated with inability to ambulate and a fall. 04/21/22 Brain MRI: Acute left pontine infarct.    PT Comments    Pt received in bed, agreed to PT session. Pt required Supervision to transition to the EOB with head of bed raised. Good static sitting balance. Pt required Min/ModA to stand from bed with support of hemi-walker. Gait training with Mod/MinA, support of HW on Right side ~47ft. Pt with heavy reliance on assistive device and onto therapist in order to weight shift and advance steps bilaterally. Pt appears to be a high fall risk at this time and will benefit from continued skilled PT services while in-pt. Pt will need 24 hour assistance, HHPT, help to negotiate stairs to get into home if no ramp available. Continue PT per POC.   Recommendations for follow up therapy are one component of a multi-disciplinary discharge planning process, led by the attending physician.  Recommendations may be updated based on patient status, additional functional criteria and insurance authorization.  Follow Up Recommendations  Home health PT     Assistance Recommended at Discharge Frequent or constant Supervision/Assistance  Patient can return home with the following A little help with walking and/or transfers;A little help with bathing/dressing/bathroom;Help with stairs or ramp for entrance   Equipment Recommendations       Recommendations for Other Services       Precautions /  Restrictions Precautions Precautions: Fall Restrictions Weight Bearing Restrictions: No     Mobility  Bed Mobility Overal bed mobility: Needs Assistance Bed Mobility: Supine to Sit     Supine to sit: Supervision, HOB elevated     General bed mobility comments:  (use of bed rail)    Transfers Overall transfer level: Needs assistance Equipment used: Hemi-walker Transfers: Sit to/from Stand Sit to Stand: Min guard, Min assist           General transfer comment: Pt able to stand after 2 attempts from raised bed    Ambulation/Gait Ambulation/Gait assistance: Min assist, Mod assist Gait Distance (Feet): 40 Feet Assistive device: Hemi-walker Gait Pattern/deviations: Step-to pattern, Knee hyperextension - left, Narrow base of support, Decreased step length - right, Decreased step length - left       General Gait Details: 3-point, step to gait pattern; exaggerated R lateral lean/weight shift to unweight and advance L LE; ModA provided to support pt at left elbow/forearm while completing R foot advancement, increased sway in all planes.  Patient feeling generally unsteady/unsafe, high fall risk   Stairs             Wheelchair Mobility    Modified Rankin (Stroke Patients Only)       Balance Overall balance assessment: Needs assistance Sitting-balance support: No upper extremity supported, Feet supported Sitting balance-Leahy Scale: Good     Standing balance support: Single extremity supported Standing balance-Leahy Scale: Fair Standing balance comment:  (Static standing- fair, dynamic - poor)  Cognition Arousal/Alertness: Awake/alert Behavior During Therapy: WFL for tasks assessed/performed Overall Cognitive Status: Within Functional Limits for tasks assessed                                 General Comments:  (Very pleasant)        Exercises General Exercises - Lower Extremity Ankle Circles/Pumps:  AROM, Right, AAROM, Left, 10 reps Long Arc Quad: AROM, Both, 10 reps Hip Flexion/Marching: AAROM, Both, 10 reps, Seated    General Comments General comments (skin integrity, edema, etc.):  (Pt answering questions appropriately)      Pertinent Vitals/Pain Pain Assessment Pain Assessment: No/denies pain    Home Living                          Prior Function            PT Goals (current goals can now be found in the care plan section) Acute Rehab PT Goals Patient Stated Goal: to get better and go home Progress towards PT goals: Progressing toward goals    Frequency    7X/week      PT Plan Current plan remains appropriate    Co-evaluation              AM-PAC PT "6 Clicks" Mobility   Outcome Measure  Help needed turning from your back to your side while in a flat bed without using bedrails?: None Help needed moving from lying on your back to sitting on the side of a flat bed without using bedrails?: A Little Help needed moving to and from a bed to a chair (including a wheelchair)?: A Little Help needed standing up from a chair using your arms (e.g., wheelchair or bedside chair)?: A Little Help needed to walk in hospital room?: A Little Help needed climbing 3-5 steps with a railing? : A Lot 6 Click Score: 18    End of Session Equipment Utilized During Treatment: Gait belt Activity Tolerance: Patient tolerated treatment well Patient left: in chair;with call bell/phone within reach;with chair alarm set Nurse Communication: Mobility status PT Visit Diagnosis: Muscle weakness (generalized) (M62.81);Hemiplegia and hemiparesis;Difficulty in walking, not elsewhere classified (R26.2) Hemiplegia - Right/Left: Left Hemiplegia - dominant/non-dominant: Non-dominant Hemiplegia - caused by: Cerebral infarction     Time: 1200-1240 PT Time Calculation (min) (ACUTE ONLY): 40 min  Charges:  $Gait Training: 8-22 mins $Therapeutic Exercise: 8-22 mins $Therapeutic  Activity: 8-22 mins                    Zadie Cleverly, PTA    Jannet Askew 04/23/2022, 1:57 PM

## 2022-04-23 NOTE — Progress Notes (Signed)
Patient is not able to walk the distance required to go the bathroom, or he/she is unable to safely negotiate stairs required to access the bathroom.  A 3in1 BSC will alleviate this problem  

## 2022-04-23 NOTE — Discharge Summary (Signed)
Physician Discharge Summary   Patient: Evan HALTEMAN Sr. MRN: [id number] DOB: 10-17-1955  Admit date:     04/21/2022  Discharge date: 04/28/22  Discharge Physician: Ezekiel Slocumb   PCP: [facility]   Recommendations at discharge:    Follow up with neurology Follow up with Primary Care in 1-2 weeks Follow with home health PT/OT/SLP  Discharge Diagnoses: Principal Problem:   Weakness Active Problems:   Acute CVA (cerebrovascular accident) ([place])   Recurrent strokes ([place])   Uncontrolled type 2 diabetes mellitus with hyperglycemia, with long-term current use of insulin (HCC)   Moderate major depression ([place])   Primary hypertension   Paroxysmal atrial fibrillation ([place])   CKD stage 3 due to type 2 diabetes mellitus ([place])   Slurred speech   Acute ischemic stroke ([place])   Hypoglycemia  Resolved Problems:   * No resolved hospital problems. *  Hospital Course: HPI on admission:  "Evan Townsend. is a 66 y.o. male with medical history significant for CVA with left-sided hemiparesis, history of paroxysmal A-fib, hypertension, dyslipidemia, diabetes mellitus, diastolic CHF who was brought into the ER on 04/21/2022 via private vehicle for evaluation of new onset right-sided weakness that started about noon on the day prior, with inability to ambulate and a fall. Most of the history was obtained from patient's wife at the bedside and who states that at baseline he has left-sided hemiparesis but is able to ambulate with a cane.  She notes that 1 day prior to his admission he had difficulty getting around due to right-sided weakness and she also notes that he had slurred speech which she said has improved since he arrived in the ER."   Patient has had poor oral intake as well as several episodes of diarrhea... He reported appetite is poor.    CT scan of the head without contrast in the ER  showed no acute findings.  Admitted to medicine service for further evaluation and  management.  Neurology and Cardiology consulted.       Assessment and Plan: * Weakness Patient presents for evaluation of weakness and inability to ambulate  Has a history of a prior CVA with left-sided hemiparesis and wife is concerned about  new right-sided weakness.   Patient able to move his right upper and lower extremity but according to his wife has been unable to ambulate. Generalized weakness may be secondary to dehydration from poor oral intake. MRI of the brain showed acute Left pontine stroke. --Neurology following --PT/OT to continue with [facility] --Fall precautions --Mgmt as outlined above/below  Acute CVA (cerebrovascular accident) ([place]) MRI brain with acute Left pontine infarct.  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   --Neurology consulted --Cardiology recommended for TEE which was negative --Telemetry - no significant events seen --MRA head --Carotid dopplers - moderate R ICA plaque progressed since 08/2018 but not hemodynamically significant, minimal on the left --PT/OT/SLP evaluated -- recommended and set up for [facility] --Goal normal BP  --Continue Lipitor  --Resume Eliquis   Recurrent strokes ([place]) Patient has a history of recurrent strokes with left-sided hemiparesis. Now with acute left pontine stroke. --Continue aggressive risk factor modification --See acute CVA for mgmt  Uncontrolled type 2 diabetes mellitus with hyperglycemia, with long-term current use of insulin ([place]) With hypoglycemia during this admission.   --Home meds were held, resume at d/c   Hypoglycemia CBG was 62 early this AM, while patient was NPO for procedure.   --  On hypoglycemia protocol --Monitor CBG's closely  Acute ischemic stroke [facility]) See acute CVA  Slurred speech Due to acute stroke most likely. SLP evaluation.  CKD stage 3 due to type 2 diabetes mellitus (HCC) CKD stage IIIa Renal function appears  stable Monitor closely  Paroxysmal atrial fibrillation (HCC) Eliquis was initially held, resume today per neurology. Not on rate control agent likely due to bradycardia. Cardiology following.   Primary hypertension Increased amlodipine at 5>>10 mg with BP's remaining elevated.    Moderate major depression ([place]) Continue Remeron Patient may need an increase in his dose to also help with his poor appetite         Consultants: Neurology, Cardiology Procedures performed: TTE, TEE Disposition: Home health Diet recommendation:  Discharge Diet Orders (From admission, onward)     Start     Ordered   04/23/22 0000  Diet - low sodium heart healthy        04/23/22 1358           Cardiac diet DISCHARGE MEDICATION: Allergies as of 04/23/2022   No Known Allergies      Medication List     TAKE these medications    Accu-Chek Guide test strip Generic drug: glucose blood Check fsbs once daily   Accu-Chek Softclix Lancets lancets SMARTSIG:Topical   albuterol 108 (90 Base) MCG/ACT inhaler Commonly known as: VENTOLIN HFA Inhale 2 puffs into the lungs every 6 (six) hours as needed for wheezing or shortness of breath.   amLODipine 10 MG tablet Commonly known as: NORVASC TAKE 1 TABLET BY MOUTH DAILY   apixaban 5 MG Tabs tablet Commonly known as: Eliquis Take 1 tablet (5 mg total) by mouth 2 (two) times daily. Need visit for further refills.   atorvastatin 80 MG tablet Commonly known as: LIPITOR TAKE 1 TABLET BY MOUTH EVERY EVENING   Blood Glucose Monitoring Suppl Kit Accucheck Guide with lancets and test strips #100 with one refill.  Test blood sugar twice daily. DX Code E11.65 ; Z79.4   Comfort Protect Adult Diaper/L Misc 1 Units by Does not apply route daily. Use 3-4 a day as needed   dapagliflozin propanediol 10 MG Tabs tablet Commonly known as: Farxiga Take 1 tablet (10 mg total) by mouth daily before breakfast. In place of glipizide   FreeStyle Libre 2  Sensor Misc 1 each by Does not apply route every 14 (fourteen) days.   Insulin Pen Needle 32G X 6 MM Misc Daily   iron polysaccharides 150 MG capsule Commonly known as: NIFEREX Take 1 capsule (150 mg total) by mouth daily. Can take any form of over-the-counter iron supplement.   losartan 25 MG tablet Commonly known as: COZAAR Take 1 tablet (25 mg total) by mouth daily.   melatonin 1 MG Tabs tablet Take 3 tablets (3 mg total) by mouth at bedtime as needed. What changed:  how much to take when to take this reasons to take this   metoCLOPramide 5 MG tablet Commonly known as: REGLAN TAKE 1 TABLET BY MOUTH 3 TIMES DAILY BEFORE MEALS   mirtazapine 7.5 MG tablet Commonly known as: REMERON Take 1 tablet (7.5 mg total) by mouth at bedtime.   Misc. Devices Misc One pair of Compression stockings  Hemiplegia and hemiparesis following cerebral infarction affecting left non-dominant side (HCC)  - Primary Codes: B15.176 Lower extremity edema  Codes: R60.0 Type 2 diabetes mellitus with hyperglycemia, with long-term current use of insulin (HCC)  Codes: E11.65, Z79.4   nitroGLYCERIN 0.4 MG SL tablet  Commonly known as: NITROSTAT Place 1 tablet (0.4 mg total) under the tongue every 5 (five) minutes as needed for chest pain.   omeprazole 40 MG capsule Commonly known as: PRILOSEC TAKE 1 CAPSULE BY MOUTH IN THE MORNING AND AT BEDTIME   tamsulosin 0.4 MG Caps capsule Commonly known as: FLOMAX Take 1 capsule (0.4 mg total) by mouth daily.        Discharge Exam: Filed Weights   04/21/22 1422  Weight: 65.1 kg   General exam: awake, alert, no acute distress HEENT: atraumatic, clear conjunctiva, anicteric sclera, moist mucus membranes, hearing grossly normal  Respiratory system: CTAB, no wheezes, rales or rhonchi, normal respiratory effort. Cardiovascular system: normal S1/S2, RRR, Gastrointestinal system: soft, NT, ND Central nervous system: CN's grossly intact, A&O Extremities:  no edema, normal tone Skin: dry, intact, normal temperature, normal color,No rashes, lesions or ulcers Psychiatry: normal mood, congruent affect, judgement and insight appear normal   Condition at discharge: stable  The results of significant diagnostics from this hospitalization (including imaging, microbiology, ancillary and laboratory) are listed below for reference.   Imaging Studies: ECHO TEE  Result Date: 04/22/2022    TRANSESOPHOGEAL ECHO REPORT   Patient Name:   Evan RECZEK Sr. Date of Exam: 04/22/2022 Medical Rec #:  [id number]           Height:       65.0 in Accession #:    [id number]          Weight:       143.5 lb Date of Birth:  February 27, 1956           BSA:          1.718 m Patient Age:    31 years            BP:           134/74 mmHg Patient Gender: M                   HR:           55 bpm. Exam Location:  [facility] Procedure: Transesophageal Echo, Cardiac Doppler, Color Doppler and Saline            Contrast Bubble Study Indications:     Cerebral Infarction , unspecified I63.9  History:         Patient has prior history of Echocardiogram examinations, most                  recent 04/21/2022.  Sonographer:     Sherrie Sport Referring Phys:  [id number] SHERI HAMMOCK Diagnosing Phys: Kate Sable MD PROCEDURE: The transesophogeal probe was passed without difficulty through the esophogus of the patient. Sedation performed by performing physician. The patient developed no complications during the procedure. IMPRESSIONS  1. Left ventricular ejection fraction, by estimation, is 55 to 60%. The left ventricle has normal function.  2. Right ventricular systolic function is normal. The right ventricular size is normal.  3. No left atrial/left atrial appendage thrombus was detected.  4. A small pericardial effusion is present.  5. The mitral valve is normal in structure. Mild mitral valve regurgitation.  6. The aortic valve is tricuspid. Aortic valve regurgitation is not visualized.  7. Agitated saline contrast  bubble study was negative, with no evidence of any interatrial shunt. Conclusion(s)/Recommendation(s): No LA/LAA thrombus identified. Negative bubble study for interatrial shunt. No intracardiac source of embolism detected on this on this transesophageal echocardiogram. FINDINGS  Left Ventricle: Left ventricular ejection fraction,  by estimation, is 55 to 60%. The left ventricle has normal function. The left ventricular internal cavity size was normal in size. Right Ventricle: The right ventricular size is normal. No increase in right ventricular wall thickness. Right ventricular systolic function is normal. Left Atrium: Left atrial size was normal in size. No left atrial/left atrial appendage thrombus was detected. Right Atrium: Right atrial size was normal in size. Pericardium: A small pericardial effusion is present. Mitral Valve: The mitral valve is normal in structure. Mild mitral valve regurgitation. Tricuspid Valve: The tricuspid valve is normal in structure. Tricuspid valve regurgitation is trivial. Aortic Valve: The aortic valve is tricuspid. Aortic valve regurgitation is not visualized. Pulmonic Valve: The pulmonic valve was normal in structure. Pulmonic valve regurgitation is not visualized. Aorta: The aortic root is normal in size and structure. IAS/Shunts: No atrial level shunt detected by color flow Doppler. Agitated saline contrast was given intravenously to evaluate for intracardiac shunting. Agitated saline contrast bubble study was negative, with no evidence of any interatrial shunt. Kate Sable MD Electronically signed by Kate Sable MD Signature Date/Time: 04/22/2022/4:17:39 PM    Final    MR ANGIO HEAD WO CONTRAST  Result Date: 04/22/2022 CLINICAL DATA:  Stroke follow-up. EXAM: MRA HEAD WITHOUT CONTRAST TECHNIQUE: Angiographic images of the Circle of Willis were acquired using MRA technique without intravenous contrast. COMPARISON:  Head and neck CTA 12/28/2021.  Head MRA 09/01/2018.  FINDINGS: Anterior circulation: The internal carotid arteries are patent from skull base to carotid termini without evidence of a significant stenosis. ACAs and MCAs are patent without evidence of a proximal branch occlusion. There is chronic asymmetric irregularity and up to mild narrowing of the left M1 segment. No aneurysm is identified. Posterior circulation: The intracranial vertebral arteries are patent to the basilar with the left being dominant. Patent PICA and SCA origins are seen bilaterally. The basilar artery is widely patent. There are robust posterior communicating arteries bilaterally. The right P1 segment is mildly hypoplastic, and there is an apparent moderate to severe stenosis proximally which is more prominent than on the prior CTA but is similar to the prior MRA and may be partly technical. There is PCA branch vessel irregularity bilaterally, and there is a chronic severe left P3 branch stenosis. No aneurysm is identified. Anatomic variants: Predominant fetal supply the right PCA. IMPRESSION: 1. No large or medium vessel occlusion. 2. Intracranial atherosclerosis as above. Electronically Signed   By: Logan Bores M.D.   On: 04/22/2022 11:45   US Carotid Bilateral  Result Date: 04/22/2022 CLINICAL DATA:  Stroke. History of hypertension hyperlipidemia and diabetes. Former smoker. EXAM: BILATERAL CAROTID DUPLEX ULTRASOUND TECHNIQUE: Pearline Cables scale imaging, color Doppler and duplex ultrasound were performed of bilateral carotid and vertebral arteries in the neck. COMPARISON:  09/01/2018 FINDINGS: Criteria: Quantification of carotid stenosis is based on velocity parameters that correlate the residual internal carotid diameter with NASCET-based stenosis levels, using the diameter of the distal internal carotid lumen as the denominator for stenosis measurement. The following velocity measurements were obtained: RIGHT ICA: 88/35 cm/sec CCA: 21/19 cm/sec SYSTOLIC ICA/CCA RATIO:  1.2 ECA: 93 cm/sec LEFT  ICA: 74/29 cm/sec CCA: 41/74 cm/sec SYSTOLIC ICA/CCA RATIO:  1.0 ECA: 84 cm/sec RIGHT CAROTID ARTERY: There is a moderate amount of eccentric echogenic plaque within the right carotid bulb (image 18), extending to involve the origin and proximal aspects of the right internal carotid artery (image 25), morphologically progressed compared to the 08/2018 examination though not resulting in elevated peak systolic velocities within  the interrogated course of the right internal carotid artery to suggest a hemodynamically significant stenosis. RIGHT VERTEBRAL ARTERY:  Antegrade flow LEFT CAROTID ARTERY: There is a minimal amount of hypoechoic plaque within the left carotid bulb (image 52), extending to involve the origin and proximal aspects of the left internal carotid artery (image 58), morphologically progressed compared to the 08/2018 examination though not resulting in elevated peak systolic velocities within the interrogated course of the left internal carotid artery to suggest a hemodynamically significant stenosis. LEFT VERTEBRAL ARTERY:  Antegrade flow IMPRESSION: 1. Moderate amount of right-sided atherosclerotic plaque, morphologically progressed compared to the 08/2018 examination, though not resulting in a hemodynamically significant stenosis within the right internal carotid artery. If clinical concern persists, further evaluation with CTA could performed as indicated. 2. Minimal amount of left-sided atherosclerotic plaque, morphologically progressed compared to the 08/2018 examination, though not resulting in a hemodynamically significant stenosis within the left internal carotid artery. Electronically Signed   By: Sandi Mariscal M.D.   On: 04/22/2022 11:15   MR BRAIN WO CONTRAST  Result Date: 04/21/2022 CLINICAL DATA:  Neuro deficit, acute, stroke suspected EXAM: MRI HEAD WITHOUT CONTRAST TECHNIQUE: Multiplanar, multiecho pulse sequences of the brain and surrounding structures were obtained without  intravenous contrast. COMPARISON:  CT head from the same day.  MRI Dec 28, 2021. FINDINGS: Brain: Acute left pontine infarct. Mild edema without mass effect. Adjacent remote right pontine infarct. Right midbrain wallerian degeneration. Also, remote bilateral lacunar infarcts in the basal ganglia, thalami, and overlying corona radiata. Small remote cerebellar infarcts. No hydrocephalus, mass lesion, midline shift, or hydrocephalus. Vascular: Major arterial flow voids are maintained at the skull base. Skull and upper cervical spine: Normal marrow signal. Sinuses/Orbits: Mild paranasal sinus mucosal thickening. No acute orbital findings. Other: No mastoid effusions IMPRESSION: 1. Acute left pontine infarct.  Mild edema without mass effect. 2. Remote infarcts, detailed above. Electronically Signed   By: Margaretha Sheffield M.D.   On: 04/21/2022 17:43   ECHOCARDIOGRAM COMPLETE  Result Date: 04/21/2022    ECHOCARDIOGRAM REPORT   Patient Name:   Evan BATSON Sr. Date of Exam: 04/21/2022 Medical Rec #:  [id number]           Height:       65.0 in Accession #:    [id number]          Weight:       143.5 lb Date of Birth:  [birth year]           BSA:          1.718 m Patient Age:    72 years            BP:           184/92 mmHg Patient Gender: M                   HR:           48 bpm. Exam Location:  [facility] Procedure: 2D Echo, Cardiac Doppler and Color Doppler Indications:     I63.9 Stroke  History:         Patient has prior history of Echocardiogram examinations, most                  recent 10/17/2021. CAD, Cardiac catheterization 01/09/2022,                  Recurrent strokes, Arrythmias:Atrial Fibrillation, Bradycardia,  RBBB and 2nd degree AV block; Risk Factors:Former Smoker,                  Hypertension, Diabetes and Dyslipidemia.  Sonographer:     Rosalia Hammers Referring Phys:  [id number] AGBATA Diagnosing Phys: Nelva Bush MD  Sonographer Comments: Image acquisition challenging due to respiratory  motion. IMPRESSIONS  1. Left ventricular ejection fraction, by estimation, is 55 to 60%. The left ventricle has normal function. The left ventricle has no regional wall motion abnormalities. There is mild left ventricular hypertrophy. Left ventricular diastolic parameters are consistent with Grade I diastolic dysfunction (impaired relaxation).  2. Right ventricular systolic function is normal. The right ventricular size is normal. There is normal pulmonary artery systolic pressure.  3. Left atrial size was mildly dilated.  4. A small pericardial effusion is present. The pericardial effusion is anterior to the right ventricle.  5. The mitral valve is normal in structure. Mild mitral valve regurgitation. No evidence of mitral stenosis.  6. The aortic valve is tricuspid. Aortic valve regurgitation is not visualized. No aortic stenosis is present.  7. The inferior vena cava is normal in size with greater than 50% respiratory variability, suggesting right atrial pressure of 3 mmHg. Comparison(s): Compared to prior study in 10/2021, pericardial effusion is larger. Conclusion(s)/Recommendation(s): Echodensity noted abutting noncoronary leaflet of aortic valve of uncertain significance, not seen on prior study. In the setting of recurrent strokes, transesophageal echocardiogram may be helpful for further characterization. FINDINGS  Left Ventricle: Left ventricular ejection fraction, by estimation, is 55 to 60%. The left ventricle has normal function. The left ventricle has no regional wall motion abnormalities. The left ventricular internal cavity size was normal in size. There is  mild left ventricular hypertrophy. Left ventricular diastolic parameters are consistent with Grade I diastolic dysfunction (impaired relaxation). Right Ventricle: The right ventricular size is normal. No increase in right ventricular wall thickness. Right ventricular systolic function is normal. There is normal pulmonary artery systolic pressure.  The tricuspid regurgitant velocity is 2.62 m/s, and  with an assumed right atrial pressure of 3 mmHg, the estimated right ventricular systolic pressure is 83.6 mmHg. Left Atrium: Left atrial size was mildly dilated. Right Atrium: Right atrial size was normal in size. Pericardium: A small pericardial effusion is present. The pericardial effusion is anterior to the right ventricle. Mitral Valve: The mitral valve is normal in structure. Mild mitral valve regurgitation. No evidence of mitral valve stenosis. Tricuspid Valve: The tricuspid valve is normal in structure. Tricuspid valve regurgitation is mild. Aortic Valve: There is echodensity adjacent to the LVOT-side of the noncoronary leaflet. While this may represent sclerosis or artifact, valvular mass/vegetation cannot be excluded in the setting of recurrent stroke. The aortic valve is tricuspid. Aortic  valve regurgitation is not visualized. No aortic stenosis is present. Aortic valve mean gradient measures 3.0 mmHg. Aortic valve peak gradient measures 4.4 mmHg. Aortic valve area, by VTI measures 1.98 cm. Pulmonic Valve: The pulmonic valve was grossly normal. Pulmonic valve regurgitation is trivial. No evidence of pulmonic stenosis. Aorta: The aortic root is normal in size and structure. Venous: The inferior vena cava is normal in size with greater than 50% respiratory variability, suggesting right atrial pressure of 3 mmHg. IAS/Shunts: No atrial level shunt detected by color flow Doppler.  LEFT VENTRICLE PLAX 2D LVIDd:         4.21 cm   Diastology LVIDs:         2.58 cm   LV e' medial:  5.87 cm/s LV PW:         1.20 cm   LV E/e' medial:  11.7 LV IVS:        1.20 cm   LV e' lateral:   6.20 cm/s LVOT diam:     1.80 cm   LV E/e' lateral: 11.1 LV SV:         62 LV SV Index:   36 LVOT Area:     2.54 cm  RIGHT VENTRICLE RV Basal diam:  2.10 cm RV S prime:     9.57 cm/s TAPSE (M-mode): 2.3 cm LEFT ATRIUM             Index        RIGHT ATRIUM           Index LA diam:         3.80 cm 2.21 cm/m   RA Area:     14.90 cm LA Vol (A2C):   49.5 ml 28.81 ml/m  RA Volume:   34.60 ml  20.14 ml/m LA Vol (A4C):   58.3 ml 33.94 ml/m LA Biplane Vol: 55.2 ml 32.13 ml/m  AORTIC VALVE AV Area (Vmax):    1.94 cm AV Area (Vmean):   1.85 cm AV Area (VTI):     1.98 cm AV Vmax:           105.00 cm/s AV Vmean:          78.100 cm/s AV VTI:            0.313 m AV Peak Grad:      4.4 mmHg AV Mean Grad:      3.0 mmHg LVOT Vmax:         80.00 cm/s LVOT Vmean:        56.900 cm/s LVOT VTI:          0.243 m LVOT/AV VTI ratio: 0.78  AORTA Ao Root diam: 3.20 cm MITRAL VALVE                TRICUSPID VALVE MV Area (PHT): 3.31 cm     TR Peak grad:   27.5 mmHg MV Decel Time: 229 msec     TR Vmax:        262.00 cm/s MV E velocity: 68.60 cm/s MV A velocity: 135.00 cm/s  SHUNTS MV E/A ratio:  0.51         Systemic VTI:  0.24 m                             Systemic Diam: 1.80 cm Nelva Bush MD Electronically signed by Nelva Bush MD Signature Date/Time: 04/21/2022/5:30:13 PM    Final    DG Chest 2 View  Result Date: 04/21/2022 CLINICAL DATA:  Weakness. EXAM: CHEST - 2 VIEW COMPARISON:  Chest radiographs 01/14/2022, 01/06/2022, 12/28/2021, 11/26/2021; CT chest 01/07/2022 FINDINGS: Cardiac silhouette and mediastinal contours within normal limits. Left mid to lower lung horizontal linear subsegmental atelectasis is slightly increased from prior 01/14/2022 on frontal view. Similar appearance on frontal view compared to 01/06/2022 but improved aeration of the posterior left lower lobe on lateral view. No pleural effusion or pneumothorax. Mild multilevel degenerative disc changes of the thoracic spine. IMPRESSION: Left mid to lower lung horizontal linear likely subsegmental atelectasis versus scarring. This appears slightly increased from 01/14/2022 which may be due to differences in technique. This is not significantly changed from 01/06/2022 on frontal view but appears improved on lateral  view. Note is made of  corresponding left lower lobe linear and ground-glass opacities on prior CT 01/07/2022. Differential considerations again include chronic scarring with possible superimposed acute atelectasis/pneumonitis. Electronically Signed   By: [name] M.D.   On: 04/21/2022 10:16   CT HEAD WO CONTRAST  Result Date: 04/21/2022 CLINICAL DATA:  Mental status change. EXAM: CT HEAD WITHOUT CONTRAST TECHNIQUE: Contiguous axial images were obtained from the base of the skull through the vertex without intravenous contrast. RADIATION DOSE REDUCTION: This exam was performed according to the departmental dose-optimization program which includes automated exposure control, adjustment of the mA and/or kV according to patient size and/or use of iterative reconstruction technique. COMPARISON:  Head CT 01/07/2022 and brain MRI 12/28/2021 FINDINGS: Brain: No evidence of acute infarction, hemorrhage, hydrocephalus, extra-axial collection or mass lesion/mass effect. Vascular: No hyperdense vessel or unexpected calcification. Skull: Normal. Negative for fracture or focal lesion. Sinuses/Orbits: The paranasal sinuses and mastoid air cells are clear. The globes are intact. Other: No scalp lesions or scalp hematoma. IMPRESSION: No acute intracranial findings. Electronically Signed   By: Marijo Sanes M.D.   On: 04/21/2022 09:24    Microbiology: Results for orders placed or performed during the hospital encounter of 04/21/22  SARS Coronavirus 2 by RT PCR (hospital order, performed in [facility] hospital lab) *cepheid single result test* Anterior Nasal Swab     Status: None   Collection Time: 04/21/22  9:30 AM   Specimen: Anterior Nasal Swab  Result Value Ref Range Status   SARS Coronavirus 2 by RT PCR NEGATIVE NEGATIVE Final    Comment: (NOTE) SARS-CoV-2 target nucleic acids are NOT DETECTED.  The SARS-CoV-2 RNA is generally detectable in upper and lower respiratory specimens during the acute phase of infection. The  lowest concentration of SARS-CoV-2 viral copies this assay can detect is 250 copies / mL. A negative result does not preclude SARS-CoV-2 infection and should not be used as the sole basis for treatment or other patient management decisions.  A negative result may occur with improper specimen collection / handling, submission of specimen other than nasopharyngeal swab, presence of viral mutation(s) within the areas targeted by this assay, and inadequate number of viral copies (<250 copies / mL). A negative result must be combined with clinical observations, patient history, and epidemiological information.  Fact Sheet for Patients:   https://www.patel.info/  Fact Sheet for Healthcare Providers: https://hall.com/  This test is not yet approved or  cleared by the [place] FDA and has been authorized for detection and/or diagnosis of SARS-CoV-2 by FDA under an Emergency Use Authorization (EUA).  This EUA will remain in effect (meaning this test can be used) for the duration of the COVID-19 declaration under Section 564(b)(1) of the Act, 21 U.S.C. section 360bbb-3(b)(1), unless the authorization is terminated or revoked sooner.  Performed at [facility], [address] [place], [place] [address]   Culture, blood (Routine X 2) w Reflex to ID Panel     Status: None   Collection Time: 04/22/22 11:37 AM   Specimen: BLOOD  Result Value Ref Range Status   Specimen Description BLOOD RIGHT ANTECUBITAL  Final   Special Requests   Final    BOTTLES DRAWN AEROBIC AND ANAEROBIC Blood Culture adequate volume   Culture   Final    NO GROWTH 5 DAYS Performed at [facility], [address], [place], [place] [address]    Report Status 04/27/2022 FINAL  Final  Culture, blood (Routine X 2) w Reflex  to ID Panel     Status: None   Collection Time: 04/22/22 11:43 AM   Specimen: BLOOD  Result Value Ref Range Status   Specimen  Description BLOOD BLOOD RIGHT HAND  Final   Special Requests   Final    BOTTLES DRAWN AEROBIC AND ANAEROBIC Blood Culture results may not be optimal due to an excessive volume of blood received in culture bottles   Culture   Final    NO GROWTH 5 DAYS Performed at [facility], [address] [place], [place] [address]    Report Status 04/27/2022 FINAL  Final    Labs: CBC: Recent Labs  Lab 04/23/22 0415  WBC 5.6  HGB 10.1*  HCT 32.0*  MCV 84.2  PLT 098   Basic Metabolic Panel: Recent Labs  Lab 04/23/22 0415  NA 143  K 3.6  CL 113*  CO2 21*  GLUCOSE 84  BUN 22  CREATININE 1.58*  CALCIUM 9.2   Liver Function Tests: No results for input(s): "AST", "ALT", "ALKPHOS", "BILITOT", "PROT", "ALBUMIN" in the last 168 hours.  CBG: Recent Labs  Lab 04/22/22 1426 04/22/22 1603 04/22/22 2024 04/23/22 0835 04/23/22 1323  GLUCAP 85 132* 91 74 166*    Discharge time spent: less than 30 minutes.  Signed: Ezekiel Slocumb, DO [facility] 04/28/2022

## 2022-04-24 ENCOUNTER — Telehealth: Payer: Self-pay

## 2022-04-24 NOTE — Telephone Encounter (Signed)
err

## 2022-04-24 NOTE — Patient Outreach (Signed)
  Care Coordination [facility] Note Transition Care Management Unsuccessful Follow-up Telephone Call  Date of discharge and from where:  04/23/22 [facility]  Attempts:  1st Attempt  Reason for unsuccessful TCM follow-up call:  No answer/busy  Jodelle Gross, RN, BSN, CCM Care Management Coordinator [facility] Phone: [phone]/Fax: [phone]

## 2022-04-25 DIAGNOSIS — I69351 Hemiplegia and hemiparesis following cerebral infarction affecting right dominant side: Secondary | ICD-10-CM | POA: Diagnosis not present

## 2022-04-25 DIAGNOSIS — E7800 Pure hypercholesterolemia, unspecified: Secondary | ICD-10-CM | POA: Diagnosis not present

## 2022-04-25 DIAGNOSIS — Z87891 Personal history of nicotine dependence: Secondary | ICD-10-CM | POA: Diagnosis not present

## 2022-04-25 DIAGNOSIS — Z9181 History of falling: Secondary | ICD-10-CM | POA: Diagnosis not present

## 2022-04-25 DIAGNOSIS — I110 Hypertensive heart disease with heart failure: Secondary | ICD-10-CM | POA: Diagnosis not present

## 2022-04-25 DIAGNOSIS — I480 Paroxysmal atrial fibrillation: Secondary | ICD-10-CM | POA: Diagnosis not present

## 2022-04-25 DIAGNOSIS — I69354 Hemiplegia and hemiparesis following cerebral infarction affecting left non-dominant side: Secondary | ICD-10-CM | POA: Diagnosis not present

## 2022-04-25 DIAGNOSIS — Z7901 Long term (current) use of anticoagulants: Secondary | ICD-10-CM | POA: Diagnosis not present

## 2022-04-25 DIAGNOSIS — Z7984 Long term (current) use of oral hypoglycemic drugs: Secondary | ICD-10-CM | POA: Diagnosis not present

## 2022-04-25 DIAGNOSIS — E1322 Other specified diabetes mellitus with diabetic chronic kidney disease: Secondary | ICD-10-CM | POA: Diagnosis not present

## 2022-04-25 DIAGNOSIS — N1831 Chronic kidney disease, stage 3a: Secondary | ICD-10-CM | POA: Diagnosis not present

## 2022-04-25 DIAGNOSIS — I639 Cerebral infarction, unspecified: Secondary | ICD-10-CM | POA: Diagnosis not present

## 2022-04-25 DIAGNOSIS — I5030 Unspecified diastolic (congestive) heart failure: Secondary | ICD-10-CM | POA: Diagnosis not present

## 2022-04-25 DIAGNOSIS — E1365 Other specified diabetes mellitus with hyperglycemia: Secondary | ICD-10-CM | POA: Diagnosis not present

## 2022-04-25 DIAGNOSIS — I6523 Occlusion and stenosis of bilateral carotid arteries: Secondary | ICD-10-CM | POA: Diagnosis not present

## 2022-04-25 DIAGNOSIS — I69322 Dysarthria following cerebral infarction: Secondary | ICD-10-CM | POA: Diagnosis not present

## 2022-04-27 ENCOUNTER — Telehealth: Payer: Self-pay | Admitting: *Deleted

## 2022-04-27 ENCOUNTER — Telehealth: Payer: Self-pay | Admitting: Physician Assistant

## 2022-04-27 LAB — CULTURE, BLOOD (ROUTINE X 2)
Culture: NO GROWTH
Culture: NO GROWTH
Special Requests: ADEQUATE

## 2022-04-27 NOTE — Telephone Encounter (Signed)
Home Health Verbal Orders - Caller/Agency: Kathy/ [facility]  Callback Number: [phone] can be left  Requesting Speech Therapy Frequency: 2xs a week for 4 weeks and  1x a week for 3 weeks

## 2022-04-27 NOTE — Telephone Encounter (Signed)
Return phone call made and verbal orders given

## 2022-04-27 NOTE — Patient Outreach (Signed)
  Care Coordination [facility] Note Transition Care Management Unsuccessful Follow-up Telephone Call  Date of discharge and from where:  04/23/22 from [facility]  Attempts:  2nd Attempt  Reason for unsuccessful TCM follow-up call:  Unable to leave message   Demetrios Loll, BSN, RN-BC RN Care Coordinator [facility] / [facility] Dial: [phone]

## 2022-04-29 DIAGNOSIS — E1322 Other specified diabetes mellitus with diabetic chronic kidney disease: Secondary | ICD-10-CM | POA: Diagnosis not present

## 2022-04-29 DIAGNOSIS — N1831 Chronic kidney disease, stage 3a: Secondary | ICD-10-CM | POA: Diagnosis not present

## 2022-04-29 DIAGNOSIS — I69354 Hemiplegia and hemiparesis following cerebral infarction affecting left non-dominant side: Secondary | ICD-10-CM | POA: Diagnosis not present

## 2022-04-29 DIAGNOSIS — I6523 Occlusion and stenosis of bilateral carotid arteries: Secondary | ICD-10-CM | POA: Diagnosis not present

## 2022-04-29 DIAGNOSIS — E1365 Other specified diabetes mellitus with hyperglycemia: Secondary | ICD-10-CM | POA: Diagnosis not present

## 2022-04-29 DIAGNOSIS — I639 Cerebral infarction, unspecified: Secondary | ICD-10-CM | POA: Diagnosis not present

## 2022-04-29 DIAGNOSIS — I69351 Hemiplegia and hemiparesis following cerebral infarction affecting right dominant side: Secondary | ICD-10-CM | POA: Diagnosis not present

## 2022-04-29 DIAGNOSIS — I110 Hypertensive heart disease with heart failure: Secondary | ICD-10-CM | POA: Diagnosis not present

## 2022-04-29 DIAGNOSIS — Z7901 Long term (current) use of anticoagulants: Secondary | ICD-10-CM | POA: Diagnosis not present

## 2022-04-29 DIAGNOSIS — I5030 Unspecified diastolic (congestive) heart failure: Secondary | ICD-10-CM | POA: Diagnosis not present

## 2022-04-29 DIAGNOSIS — Z7984 Long term (current) use of oral hypoglycemic drugs: Secondary | ICD-10-CM | POA: Diagnosis not present

## 2022-04-29 DIAGNOSIS — I480 Paroxysmal atrial fibrillation: Secondary | ICD-10-CM | POA: Diagnosis not present

## 2022-04-29 DIAGNOSIS — Z87891 Personal history of nicotine dependence: Secondary | ICD-10-CM | POA: Diagnosis not present

## 2022-04-29 DIAGNOSIS — Z9181 History of falling: Secondary | ICD-10-CM | POA: Diagnosis not present

## 2022-04-29 DIAGNOSIS — I69322 Dysarthria following cerebral infarction: Secondary | ICD-10-CM | POA: Diagnosis not present

## 2022-04-29 DIAGNOSIS — E7800 Pure hypercholesterolemia, unspecified: Secondary | ICD-10-CM | POA: Diagnosis not present

## 2022-04-30 ENCOUNTER — Telehealth: Payer: Self-pay | Admitting: Physician Assistant

## 2022-04-30 ENCOUNTER — Telehealth: Payer: Self-pay | Admitting: *Deleted

## 2022-04-30 ENCOUNTER — Encounter: Payer: Self-pay | Admitting: *Deleted

## 2022-04-30 DIAGNOSIS — Z87891 Personal history of nicotine dependence: Secondary | ICD-10-CM | POA: Diagnosis not present

## 2022-04-30 DIAGNOSIS — I69351 Hemiplegia and hemiparesis following cerebral infarction affecting right dominant side: Secondary | ICD-10-CM | POA: Diagnosis not present

## 2022-04-30 DIAGNOSIS — I639 Cerebral infarction, unspecified: Secondary | ICD-10-CM | POA: Diagnosis not present

## 2022-04-30 DIAGNOSIS — I69322 Dysarthria following cerebral infarction: Secondary | ICD-10-CM | POA: Diagnosis not present

## 2022-04-30 DIAGNOSIS — E1365 Other specified diabetes mellitus with hyperglycemia: Secondary | ICD-10-CM | POA: Diagnosis not present

## 2022-04-30 DIAGNOSIS — I110 Hypertensive heart disease with heart failure: Secondary | ICD-10-CM | POA: Diagnosis not present

## 2022-04-30 DIAGNOSIS — Z9181 History of falling: Secondary | ICD-10-CM | POA: Diagnosis not present

## 2022-04-30 DIAGNOSIS — E7800 Pure hypercholesterolemia, unspecified: Secondary | ICD-10-CM | POA: Diagnosis not present

## 2022-04-30 DIAGNOSIS — I69354 Hemiplegia and hemiparesis following cerebral infarction affecting left non-dominant side: Secondary | ICD-10-CM | POA: Diagnosis not present

## 2022-04-30 DIAGNOSIS — I5030 Unspecified diastolic (congestive) heart failure: Secondary | ICD-10-CM | POA: Diagnosis not present

## 2022-04-30 DIAGNOSIS — Z7984 Long term (current) use of oral hypoglycemic drugs: Secondary | ICD-10-CM | POA: Diagnosis not present

## 2022-04-30 DIAGNOSIS — E1322 Other specified diabetes mellitus with diabetic chronic kidney disease: Secondary | ICD-10-CM | POA: Diagnosis not present

## 2022-04-30 DIAGNOSIS — Z7901 Long term (current) use of anticoagulants: Secondary | ICD-10-CM | POA: Diagnosis not present

## 2022-04-30 DIAGNOSIS — I480 Paroxysmal atrial fibrillation: Secondary | ICD-10-CM | POA: Diagnosis not present

## 2022-04-30 DIAGNOSIS — I6523 Occlusion and stenosis of bilateral carotid arteries: Secondary | ICD-10-CM | POA: Diagnosis not present

## 2022-04-30 DIAGNOSIS — N1831 Chronic kidney disease, stage 3a: Secondary | ICD-10-CM | POA: Diagnosis not present

## 2022-04-30 NOTE — Patient Outreach (Signed)
  Care Coordination [facility] Note Transition Care Management Follow-up Telephone Call Date of discharge and from where: 04/23/22 from [facility] How have you been since you were released from the hospital? Doing better Any questions or concerns? Would like an order for a hospital bed and adjustable bedside table. Advised that a face-to-face office visit with notes to support the order are necessary for medicare coverage of DME. Appt note for 05/07/22 updated to reflect need. Patient's wife was ok with waiting until this appointment and did not require a sooner appt.  Items Reviewed: Did the pt receive and understand the discharge instructions provided? Yes  Medications obtained and verified? Yes  Other? No  Any new allergies since your discharge? No  Dietary orders reviewed? Yes Do you have support at home? Yes   Home Care and Equipment/Supplies: Were home health services ordered? yes If so, what is the name of the agency? [facility]  Has the agency set up a time to come to the patient's home? yes Were any new equipment or medical supplies ordered?  No What is the name of the medical supply agency? N/a Were you able to get the supplies/equipment? not applicable Do you have any questions related to the use of the equipment or supplies? No  Functional Questionnaire: (I = Independent and D = Dependent) ADLs: I  Bathing/Dressing- I  Meal Prep- I  Eating- I  Maintaining continence- I  Transferring/Ambulation- I  Managing Meds- I  Follow up appointments reviewed:  PCP Hospital f/u appt confirmed? Yes  Scheduled to see Jacquelin Hawking, PA-C on 05/07/22 @ 3:40. Specialist Hospital f/u appt confirmed? No  Staff message sent to [facility], CMA to coordinate appointment with neurologist Are transportation arrangements needed? No  If their condition worsens, is the pt aware to call PCP or go to the Emergency Dept.? Yes Was the patient provided with contact  information for the PCP's office or ED? Yes Was to pt encouraged to call back with questions or concerns? Yes  SDOH assessments and interventions completed:   Yes  Care Coordination Interventions Activated:  No   Care Coordination Interventions:   n/a     Encounter Outcome:  Pt. Visit Completed    Demetrios Loll, BSN, RN-BC RN Care Coordinator [facility]  [facility] Direct Dial: [phone] Main #: [phone]

## 2022-04-30 NOTE — Telephone Encounter (Signed)
Noted  

## 2022-04-30 NOTE — Telephone Encounter (Signed)
[facility] Social Worker  Called to report that she saw the patient today and that there will be no further visits for social work, not needed at this time. Pt's wife is going to call and request for a prescription for a hospital bed and a feeding table.   Best contact: [phone] option 2

## 2022-05-01 ENCOUNTER — Telehealth: Payer: Self-pay | Admitting: Family

## 2022-05-01 NOTE — Telephone Encounter (Signed)
Returned call to [facility], verbal orders were given

## 2022-05-01 NOTE — Telephone Encounter (Signed)
Copied from CRM [id number]. Topic: General - Other >> May 01, 2022 10:54 AM Tiffany B wrote: PT from [facility] requesting verbal orders for 2x 7

## 2022-05-05 ENCOUNTER — Other Ambulatory Visit: Payer: Self-pay | Admitting: Nurse Practitioner

## 2022-05-05 DIAGNOSIS — I69351 Hemiplegia and hemiparesis following cerebral infarction affecting right dominant side: Secondary | ICD-10-CM | POA: Diagnosis not present

## 2022-05-05 DIAGNOSIS — I480 Paroxysmal atrial fibrillation: Secondary | ICD-10-CM | POA: Diagnosis not present

## 2022-05-05 DIAGNOSIS — N1831 Chronic kidney disease, stage 3a: Secondary | ICD-10-CM | POA: Diagnosis not present

## 2022-05-05 DIAGNOSIS — E1159 Type 2 diabetes mellitus with other circulatory complications: Secondary | ICD-10-CM

## 2022-05-05 DIAGNOSIS — Z87891 Personal history of nicotine dependence: Secondary | ICD-10-CM | POA: Diagnosis not present

## 2022-05-05 DIAGNOSIS — I639 Cerebral infarction, unspecified: Secondary | ICD-10-CM | POA: Diagnosis not present

## 2022-05-05 DIAGNOSIS — Z794 Long term (current) use of insulin: Secondary | ICD-10-CM

## 2022-05-05 DIAGNOSIS — I5030 Unspecified diastolic (congestive) heart failure: Secondary | ICD-10-CM | POA: Diagnosis not present

## 2022-05-05 DIAGNOSIS — I69322 Dysarthria following cerebral infarction: Secondary | ICD-10-CM | POA: Diagnosis not present

## 2022-05-05 DIAGNOSIS — I69354 Hemiplegia and hemiparesis following cerebral infarction affecting left non-dominant side: Secondary | ICD-10-CM | POA: Diagnosis not present

## 2022-05-05 DIAGNOSIS — Z9181 History of falling: Secondary | ICD-10-CM | POA: Diagnosis not present

## 2022-05-05 DIAGNOSIS — E1322 Other specified diabetes mellitus with diabetic chronic kidney disease: Secondary | ICD-10-CM | POA: Diagnosis not present

## 2022-05-05 DIAGNOSIS — E7800 Pure hypercholesterolemia, unspecified: Secondary | ICD-10-CM | POA: Diagnosis not present

## 2022-05-05 DIAGNOSIS — Z7984 Long term (current) use of oral hypoglycemic drugs: Secondary | ICD-10-CM | POA: Diagnosis not present

## 2022-05-05 DIAGNOSIS — Z7901 Long term (current) use of anticoagulants: Secondary | ICD-10-CM | POA: Diagnosis not present

## 2022-05-05 DIAGNOSIS — I6523 Occlusion and stenosis of bilateral carotid arteries: Secondary | ICD-10-CM | POA: Diagnosis not present

## 2022-05-05 DIAGNOSIS — E1365 Other specified diabetes mellitus with hyperglycemia: Secondary | ICD-10-CM | POA: Diagnosis not present

## 2022-05-05 DIAGNOSIS — I110 Hypertensive heart disease with heart failure: Secondary | ICD-10-CM | POA: Diagnosis not present

## 2022-05-05 DIAGNOSIS — K219 Gastro-esophageal reflux disease without esophagitis: Secondary | ICD-10-CM

## 2022-05-06 NOTE — Telephone Encounter (Signed)
Requested Prescriptions  Pending Prescriptions Disp Refills  . amLODipine (NORVASC) 10 MG tablet [Pharmacy Med Name: AMLODIPINE BESYLATE 10 MG TAB] 30 tablet 0    Sig: TAKE 1 TABLET BY MOUTH DAILY     Cardiovascular: Calcium Channel Blockers 2 Failed - 05/05/2022  5:31 PM      Failed - Last BP in normal range    BP Readings from Last 1 Encounters:  04/23/22 (!) 141/83         Passed - Last Heart Rate in normal range    Pulse Readings from Last 1 Encounters:  04/23/22 73         Passed - Valid encounter within last 6 months    Recent Outpatient Visits          1 month ago Hemiplegia and hemiparesis following cerebral infarction affecting left non-dominant side ([place])   [facility] Mecum, Erin E, PA-C   3 months ago Pneumonia due to infectious organism, unspecified laterality, unspecified part of lung   [facility] Vigg, Avanti, MD   4 months ago Essential hypertension   [place] Vigg, Avanti, MD   4 months ago Acute renal failure, unspecified acute renal failure type ([place])   [facility] Vigg, Avanti, MD   4 months ago Elevated LFTs   [place], MD      Future Appointments            Tomorrow Mecum, Dani Gobble, PA-C [facility], [facility]   In 2 weeks Furth, [employer], PA-C [place]. [place]   In 3 months Vickie Epley, MD [place]. [facility]           . atorvastatin (LIPITOR) 80 MG tablet [Pharmacy Med Name: ATORVASTATIN CALCIUM 80 MG TAB] 30 tablet 0    Sig: TAKE 1 TABLET BY MOUTH EVERY EVENING     Cardiovascular:  Antilipid - Statins Failed - 05/05/2022  5:31 PM      Failed - Lipid Panel in normal range within the last 12 months    Cholesterol, Total  Date Value Ref Range Status  11/12/2021 134 100 - 199 mg/dL Final   Cholesterol  Date Value Ref Range Status  04/22/2022 128 0 - 200  mg/dL Final   LDL Cholesterol (Calc)  Date Value Ref Range Status  02/13/2020 65 mg/dL (calc) Final    Comment:    Reference range: <100 . Desirable range <100 mg/dL for primary prevention;   <70 mg/dL for patients with CHD or diabetic patients  with > or = 2 CHD risk factors. Marland Kitchen LDL-C is now calculated using the Martin-Hopkins  calculation, which is a validated novel method providing  better accuracy than the Friedewald equation in the  estimation of LDL-C.  Cresenciano Genre et al. Annamaria Helling. [id number]): 2061-2068  (http://education.Quest[employer].com/faq/FAQ164)    LDL Chol Calc (NIH)  Date Value Ref Range Status  11/12/2021 52 0 - 99 mg/dL Final   LDL Cholesterol  Date Value Ref Range Status  04/22/2022 63 0 - 99 mg/dL Final    Comment:           Total Cholesterol/HDL:CHD Risk Coronary Heart Disease Risk Table                     Men   Women  1/2 Average Risk   3.4   3.3  Average Risk       5.0   4.4  2 X Average Risk   9.6   7.1  3 X Average Risk  23.4   11.0        Use the calculated Patient Ratio above and the CHD Risk Table to determine the patient's CHD Risk.        ATP III CLASSIFICATION (LDL):  <100     mg/dL   Optimal  100-129  mg/dL   Near or Above                    Optimal  130-159  mg/dL   Borderline  160-189  mg/dL   High  >190     mg/dL   Very High Performed at [facility], [place], [place] [address]    HDL  Date Value Ref Range Status  04/22/2022 47 >40 mg/dL Final  11/12/2021 53 >39 mg/dL Final   Triglycerides  Date Value Ref Range Status  04/22/2022 91 <150 mg/dL Final         Passed - Patient is not pregnant      Passed - Valid encounter within last 12 months    Recent Outpatient Visits          1 month ago Hemiplegia and hemiparesis following cerebral infarction affecting left non-dominant side ([place])   [facility] Mecum, Erin E, PA-C   3 months ago Pneumonia due to infectious organism,  unspecified laterality, unspecified part of lung   [facility] Vigg, Avanti, MD   4 months ago Essential hypertension   [place] Vigg, Avanti, MD   4 months ago Acute renal failure, unspecified acute renal failure type ([place])   [facility] Vigg, Avanti, MD   4 months ago Elevated LFTs   [place], MD      Future Appointments            Tomorrow Mecum, Dani Gobble, PA-C [facility]   In 2 weeks Furth, [employer], PA-C [place]. [place]   In 3 months Vickie Epley, MD [place]. [facility]           . omeprazole (PRILOSEC) 40 MG capsule [Pharmacy Med Name: OMEPRAZOLE DR 40 MG CAP] 60 capsule 0    Sig: TAKE 1 CAPSULE BY MOUTH IN THE MORNING AND AT BEDTIME     Gastroenterology: Proton Pump Inhibitors Passed - 05/05/2022  5:31 PM      Passed - Valid encounter within last 12 months    Recent Outpatient Visits          1 month ago Hemiplegia and hemiparesis following cerebral infarction affecting left non-dominant side ([place])   [facility] Mecum, Erin E, PA-C   3 months ago Pneumonia due to infectious organism, unspecified laterality, unspecified part of lung   [facility] Vigg, Avanti, MD   4 months ago Essential hypertension   [place] Vigg, Avanti, MD   4 months ago Acute renal failure, unspecified acute renal failure type ([place])   [facility] Vigg, Avanti, MD   4 months ago Elevated LFTs   [place], MD      Future Appointments            Tomorrow Mecum, Dani Gobble, PA-C [facility]   In 2 weeks Kathlen Mody, [employer], PA-C [place]. [place]   In 3 months Vickie Epley, MD [place]. [place]

## 2022-05-07 ENCOUNTER — Encounter: Payer: Self-pay | Admitting: Physician Assistant

## 2022-05-07 ENCOUNTER — Telehealth: Payer: Self-pay | Admitting: Nurse Practitioner

## 2022-05-07 ENCOUNTER — Ambulatory Visit (INDEPENDENT_AMBULATORY_CARE_PROVIDER_SITE_OTHER): Payer: Medicare Other | Admitting: Physician Assistant

## 2022-05-07 VITALS — BP 151/78 | HR 63 | Temp 97.6°F

## 2022-05-07 DIAGNOSIS — I639 Cerebral infarction, unspecified: Secondary | ICD-10-CM

## 2022-05-07 DIAGNOSIS — R600 Localized edema: Secondary | ICD-10-CM | POA: Diagnosis not present

## 2022-05-07 DIAGNOSIS — Z794 Long term (current) use of insulin: Secondary | ICD-10-CM

## 2022-05-07 DIAGNOSIS — G6289 Other specified polyneuropathies: Secondary | ICD-10-CM | POA: Diagnosis not present

## 2022-05-07 DIAGNOSIS — G4700 Insomnia, unspecified: Secondary | ICD-10-CM

## 2022-05-07 DIAGNOSIS — E7800 Pure hypercholesterolemia, unspecified: Secondary | ICD-10-CM | POA: Diagnosis not present

## 2022-05-07 DIAGNOSIS — E1322 Other specified diabetes mellitus with diabetic chronic kidney disease: Secondary | ICD-10-CM | POA: Diagnosis not present

## 2022-05-07 DIAGNOSIS — Z9181 History of falling: Secondary | ICD-10-CM | POA: Diagnosis not present

## 2022-05-07 DIAGNOSIS — I480 Paroxysmal atrial fibrillation: Secondary | ICD-10-CM | POA: Diagnosis not present

## 2022-05-07 DIAGNOSIS — Z87891 Personal history of nicotine dependence: Secondary | ICD-10-CM | POA: Diagnosis not present

## 2022-05-07 DIAGNOSIS — Z7901 Long term (current) use of anticoagulants: Secondary | ICD-10-CM | POA: Diagnosis not present

## 2022-05-07 DIAGNOSIS — E1159 Type 2 diabetes mellitus with other circulatory complications: Secondary | ICD-10-CM | POA: Diagnosis not present

## 2022-05-07 DIAGNOSIS — I69354 Hemiplegia and hemiparesis following cerebral infarction affecting left non-dominant side: Secondary | ICD-10-CM | POA: Diagnosis not present

## 2022-05-07 DIAGNOSIS — I5030 Unspecified diastolic (congestive) heart failure: Secondary | ICD-10-CM | POA: Diagnosis not present

## 2022-05-07 DIAGNOSIS — I69351 Hemiplegia and hemiparesis following cerebral infarction affecting right dominant side: Secondary | ICD-10-CM | POA: Diagnosis not present

## 2022-05-07 DIAGNOSIS — N3942 Incontinence without sensory awareness: Secondary | ICD-10-CM

## 2022-05-07 DIAGNOSIS — I110 Hypertensive heart disease with heart failure: Secondary | ICD-10-CM | POA: Diagnosis not present

## 2022-05-07 DIAGNOSIS — I6523 Occlusion and stenosis of bilateral carotid arteries: Secondary | ICD-10-CM | POA: Diagnosis not present

## 2022-05-07 DIAGNOSIS — N1831 Chronic kidney disease, stage 3a: Secondary | ICD-10-CM | POA: Diagnosis not present

## 2022-05-07 DIAGNOSIS — Z7984 Long term (current) use of oral hypoglycemic drugs: Secondary | ICD-10-CM | POA: Diagnosis not present

## 2022-05-07 DIAGNOSIS — E1365 Other specified diabetes mellitus with hyperglycemia: Secondary | ICD-10-CM | POA: Diagnosis not present

## 2022-05-07 DIAGNOSIS — I69322 Dysarthria following cerebral infarction: Secondary | ICD-10-CM | POA: Diagnosis not present

## 2022-05-07 MED ORDER — COMFORT PROTECT ADULT DIAPER/L MISC: 1.0000 [IU] | Freq: Every day | 3 refills | Status: DC

## 2022-05-07 MED ORDER — MIRTAZAPINE 7.5 MG PO TABS: 7.5000 mg | ORAL_TABLET | Freq: Every day | ORAL | 2 refills | Status: DC

## 2022-05-07 MED ORDER — MEDICAL COMPRESSION STOCKINGS MISC: 1.0000 | Freq: Every morning | 0 refills | Status: AC

## 2022-05-07 NOTE — Telephone Encounter (Signed)
Patient has to have a face to face visit to get home health orders.

## 2022-05-07 NOTE — Progress Notes (Incomplete)
Established Patient Office Visit  Name: Evan HYLTON Sr.   MRN: [id number]    DOB: 06-Jul-1956   Date:05/07/2022  Today's Provider: Talitha Givens, MHS, PA-C Introduced myself to the patient as a PA-C and provided education on APPs in clinical practice.         Subjective  Chief Complaint  Chief Complaint  Patient presents with  . [place] orders    Patient needs home health orders for PT and OT. Patient would like to have an order for a feeding table that can be sent to a DME supplier that delivers.  . New Med Request    Patient's wife states that the PT said to let PCP know that he needs to have some type of pain medication to help him at night  . 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 Acute ischemic stroke ([place]) 04/22/2022  . Hypoglycemia 04/22/2022  . Slurred speech   . Weakness 04/21/2022  . CKD stage 3 due to type 2 diabetes mellitus ([place]) 04/21/2022  . Hypothermia 01/10/2022  . Acute on chronic anemia 01/08/2022  . Symptomatic bradycardia 01/07/2022  . BPH (benign prostatic hyperplasia) 01/07/2022  . GERD without esophagitis 01/07/2022  . Paroxysmal atrial fibrillation ([place]) 01/07/2022  . Dyslipidemia 01/07/2022  . Diabetes mellitus ([place]) 01/01/2022  . Acute renal failure ([place]) 01/01/2022  . Elevated liver enzymes 12/28/2021  . Pneumonia 11/26/2021  . Sepsis due to pneumonia ([place]) 11/26/2021  . CAP (community acquired pneumonia) 11/26/2021  . AKI (acute kidney  injury) ([place]) 11/26/2021  . Sinus pause   . Bradycardia 10/16/2021  . Hypertensive emergency without congestive heart failure 09/14/2021  . Hypertension   . Unstable angina ([place])   . Diabetes mellitus without complication ([place]) 35/36/1443  . Primary hypertension 06/27/2021  . Diabetic ulcer of toe of left foot associated with type 2 diabetes mellitus, limited to breakdown of skin ([place]) 06/27/2021  . Need for pneumococcal vaccination 06/20/2021  . Acute CVA (cerebrovascular accident) ([place]) 06/05/2021  . Need for influenza vaccination 06/02/2021  . Constipation 06/02/2021  . Insomnia 06/02/2021  . Gastroparesis 02/26/2021  . Moderate major depression ([place]) 02/26/2021  . Muscle spasm of left lower extremity 02/26/2021  . History of stroke 02/26/2021  . Type 2 diabetes mellitus with hyperglycemia ([place]) 10/29/2020  . Diastolic dysfunction   . History of diabetes with ketoacidosis 09/20/2020  . CKD (chronic kidney disease) stage 2, GFR 60-89 ml/min 06/27/2020  . Monitoring for anticoagulant use 06/27/2020  . Lower extremity edema 05/16/2020  . Atrial fibrillation, chronic ([place]) 10/26/2019  . Anemia of chronic disease 09/05/2019  . Acute kidney injury superimposed on CKD ([place]) 09/05/2019  . Coagulopathy ([place]) 06/12/2019  . Gastroesophageal reflux disease   . Hemiplegia and hemiparesis following cerebral infarction affecting left non-dominant side ([place]) 11/25/2018  . RBBB 10/15/2018  . Dysphagia, post-stroke   . Microalbuminuria 07/29/2018  .  Hyperlipidemia LDL goal <70 02/11/2017  . Recurrent strokes ([place]) 11/10/2016  . Essential hypertension 11/10/2016  . Uncontrolled type 2 diabetes mellitus with hyperglycemia, with long-term current use of insulin ([place]) 11/10/2016  . Tobacco abuse 08/28/2013  . Peripheral neuropathy 08/28/2013    Past Surgical History:  Procedure Laterality Date  . ESOPHAGOGASTRODUODENOSCOPY (EGD) WITH PROPOFOL N/A 04/26/2019   Procedure:  ESOPHAGOGASTRODUODENOSCOPY (EGD) WITH PROPOFOL;  Surgeon: Lin Landsman, MD;  Location: [facility];  Service: Gastroenterology;  Laterality: N/A;  . LEFT HEART CATH AND CORONARY ANGIOGRAPHY N/A 01/09/2022   Procedure: LEFT HEART CATH AND CORONARY ANGIOGRAPHY;  Surgeon: Nelva Bush, MD;  Location: [facility];  Service: Cardiovascular;  Laterality: N/A;  . NO PAST SURGERIES    . TEE WITHOUT CARDIOVERSION N/A 04/22/2022   Procedure: TRANSESOPHAGEAL ECHOCARDIOGRAM (TEE);  Surgeon: Kate Sable, MD;  Location: [facility];  Service: Cardiovascular;  Laterality: N/A;    Family History  Problem Relation Age of Onset  . Hypertension Mother   . Stroke Mother        died @ age 65  . Hypertension Father   . Diabetes Father   . Heart attack Father        died @ 51    Social History   Tobacco Use  . Smoking status: Former    Packs/day: 1.00    Years: 38.00    Total pack years: 38.00    Types: Cigarettes    Start date: 35  . Smokeless tobacco: Never  Substance Use Topics  . Alcohol use: Not Currently    Alcohol/week: 1.0 standard drink of alcohol    Types: 1 Cans of beer per week    Comment: previously drank but nothing in 1-2 yrs (08/2019).     Current Outpatient Medications:  .  ACCU-CHEK GUIDE test strip, Check fsbs once daily, Disp: 100 each, Rfl: 2 .  Accu-Chek Softclix Lancets lancets, SMARTSIG:Topical, Disp: 100 each, Rfl: 1 .  albuterol (VENTOLIN HFA) 108 (90 Base) MCG/ACT inhaler, Inhale 2 puffs into the lungs every 6 (six) hours as needed for wheezing or shortness of breath., Disp: 8 g, Rfl: 1 .  amLODipine (NORVASC) 10 MG tablet, TAKE 1 TABLET BY MOUTH DAILY, Disp: 30 tablet, Rfl: 0 .  apixaban (ELIQUIS) 5 MG TABS tablet, Take 1 tablet (5 mg total) by mouth 2 (two) times daily. Need visit for further refills., Disp: 120 tablet, Rfl: 0 .  atorvastatin (LIPITOR) 80 MG tablet, TAKE 1 TABLET BY MOUTH EVERY EVENING, Disp: 30 tablet, Rfl: 0 .  Blood  Glucose Monitoring Suppl KIT, Accucheck Guide with lancets and test strips #100 with one refill.  Test blood sugar twice daily. DX Code E11.65 ; Z79.4, Disp: 1 kit, Rfl: 0 .  Continuous Blood Gluc Sensor (FREESTYLE LIBRE 2 SENSOR) MISC, 1 each by Does not apply route every 14 (fourteen) days., Disp: 6 each, Rfl: 1 .  dapagliflozin propanediol (FARXIGA) 10 MG TABS tablet, Take 1 tablet (10 mg total) by mouth daily before breakfast. In place of glipizide, Disp: 30 tablet, Rfl: 0 .  Incontinence Supply Disposable (COMFORT PROTECT ADULT DIAPER/L) MISC, 1 Units by Does not apply route daily. Use 3-4 a day as needed, Disp: 90 each, Rfl: 3 .  Insulin Pen Needle 32G X 6 MM MISC, Daily, Disp: 100 each, Rfl: 3 .  iron polysaccharides (NIFEREX) 150 MG capsule, Take 1 capsule (150 mg total) by mouth daily. Can take any form of over-the-counter iron supplement., Disp: , Rfl:  .  losartan (COZAAR) 25 MG tablet, Take 1 tablet (25 mg total) by mouth daily., Disp: 30 tablet, Rfl: 1 .  melatonin 1 MG TABS tablet, Take 3 tablets (3 mg total) by mouth at bedtime as needed., Disp: 30 tablet, Rfl: 4 .  metoCLOPramide (REGLAN) 5 MG tablet, TAKE 1 TABLET BY MOUTH 3 TIMES DAILY BEFORE MEALS, Disp: 90 tablet, Rfl: 0 .  mirtazapine (REMERON) 7.5 MG tablet, Take 1 tablet (7.5 mg total) by mouth at bedtime., Disp: 30 tablet, Rfl: 3 .  Misc. Devices MISC, One pair of Compression stockings  Hemiplegia and hemiparesis following cerebral infarction affecting left non-dominant side (HCC)  - Primary Codes: T55.732 Lower extremity edema  Codes: R60.0 Type 2 diabetes mellitus with hyperglycemia, with long-term current use of insulin (HCC)  Codes: E11.65, Z79.4, Disp: 1 Units, Rfl: 0 .  nitroGLYCERIN (NITROSTAT) 0.4 MG SL tablet, Place 1 tablet (0.4 mg total) under the tongue every 5 (five) minutes as needed for chest pain., Disp: 30 tablet, Rfl: 0 .  omeprazole (PRILOSEC) 40 MG capsule, TAKE 1 CAPSULE BY MOUTH IN THE MORNING AND AT BEDTIME,  Disp: 60 capsule, Rfl: 0 .  tamsulosin (FLOMAX) 0.4 MG CAPS capsule, Take 1 capsule (0.4 mg total) by mouth daily., Disp: 90 capsule, Rfl: 3  No Known Allergies  I personally reviewed active problem list, medication list, allergies, notes from last encounter, notes from last 5 encounters, lab results with the patient/caregiver today.   Review of Systems  Constitutional:  Positive for chills. Negative for fever.  Eyes:  Positive for blurred vision. Negative for double vision.  Respiratory:  Negative for shortness of breath and wheezing.   Cardiovascular:  Negative for chest pain.  Musculoskeletal:  Positive for myalgias. Negative for falls.  Neurological:  Positive for tremors and weakness. Negative for headaches.      Objective  Vitals:   05/07/22 1549 05/07/22 1554  BP: (!) 155/79 (!) 151/78  Pulse: 63 63  Temp: 97.6 F (36.4 C)   TempSrc: Oral   SpO2: 97%     There is no height or weight on file to calculate BMI.  Physical Exam Vitals reviewed.  Constitutional:      General: He is awake.     Appearance: Normal appearance. He is well-developed and well-groomed.  HENT:     Head: Normocephalic and atraumatic.  Eyes:     General: Lids are normal. Gaze aligned appropriately.     Extraocular Movements: Extraocular movements intact.     Conjunctiva/sclera: Conjunctivae normal.     Pupils: Pupils are equal, round, and reactive to light.  Pulmonary:     Effort: Pulmonary effort is normal.  Neurological:     Mental Status: He is alert. Mental status is at baseline.     GCS: GCS eye subscore is 4. GCS verbal subscore is 5. GCS motor subscore is 6.     Cranial Nerves: Dysarthria and facial asymmetry present.     Motor: Weakness and abnormal muscle tone present.     Comments: Weakness and abnormal tone on left side   Psychiatric:        Attention and Perception: Attention normal.        Mood and Affect: Mood and affect normal.        Speech: Speech is slurred.         Behavior: Behavior normal. Behavior is cooperative.      Recent Results (from the past 2160 hour(s))  Bladder Scan (Post Void Residual) in office  Status: None   Collection Time: 02/25/22  9:07 AM  Result Value Ref Range   Scan Result 236m   Protime-INR     Status: None   Collection Time: 04/21/22  9:30 AM  Result Value Ref Range   Prothrombin Time 13.6 11.4 - 15.2 seconds   INR 1.1 0.8 - 1.2    Comment: (NOTE) INR goal varies based on device and disease states. Performed at [facility] [address], [place] [address]  APTT     Status: Abnormal   Collection Time: 04/21/22  9:30 AM  Result Value Ref Range   aPTT 37 (H) 24 - 36 seconds    Comment:        IF BASELINE aPTT IS ELEVATED, SUGGEST PATIENT RISK ASSESSMENT BE USED TO DETERMINE APPROPRIATE ANTICOAGULANT THERAPY. Performed at [facility] [address], [place] [address]  CBC     Status: Abnormal   Collection Time: 04/21/22  9:30 AM  Result Value Ref Range   WBC 5.6 4.0 - 10.5 K/uL   RBC 4.00 (L) 4.22 - 5.81 MIL/uL   Hemoglobin 10.6 (L) 13.0 - 17.0 g/dL   HCT 33.5 (L) 39.0 - 52.0 %   MCV 83.8 80.0 - 100.0 fL   MCH 26.5 26.0 - 34.0 pg   MCHC 31.6 30.0 - 36.0 g/dL   RDW 19.0 (H) 11.5 - 15.5 %   Platelets 267 150 - 400 K/uL   nRBC 0.0 0.0 - 0.2 %    Comment: Performed at [facility] [address], [place] [address] Differential     Status: None   Collection Time: 04/21/22  9:30 AM  Result Value Ref Range   Neutrophils Relative % 54 %   Neutro Abs 3.0 1.7 - 7.7 K/uL   Lymphocytes Relative 35 %   Lymphs Abs 2.0 0.7 - 4.0 K/uL   Monocytes Relative 9 %   Monocytes Absolute 0.5 0.1 - 1.0 K/uL   Eosinophils Relative 1 %   Eosinophils Absolute 0.1 0.0 - 0.5 K/uL   Basophils Relative 1 %   Basophils Absolute 0.0 0.0 - 0.1 K/uL   Immature Granulocytes 0 %   Abs Immature Granulocytes 0.01 0.00 - 0.07 K/uL    Comment: [facility]formed at [facility] [address], [place] [address] Comprehensive metabolic panel     Status: Abnormal   Collection Time: 04/21/22  9:30 AM  Result Value Ref Range   Sodium 141 135 - 145 mmol/L   Potassium 3.8 3.5 - 5.1 mmol/L   Chloride 113 (H) 98 - 111 mmol/L   CO2 21 (L) 22 - 32 mmol/L   Glucose, Bld 114 (H) 70 - 99 mg/dL    Comment: Glucose reference range applies only to samples taken after fasting for at least 8 hours.   BUN 27 (H) 8 - 23 mg/dL   Creatinine, Ser 1.82 (H) 0.61 - 1.24 mg/dL   Calcium 9.3 8.9 - 10.3 mg/dL   Total Protein 8.1 6.5 - 8.1 g/dL   Albumin 3.7 3.5 - 5.0 g/dL   AST 29 15 - 41 U/L   ALT 47 (H) 0 - 44 U/L   Alkaline Phosphatase 164 (H) 38 - 126 U/L   Total Bilirubin 0.7 0.3 - 1.2 mg/dL   GFR, Estimated 41 (L) >60 mL/min    Comment: (NOTE) Calculated using the CKD-EPI Creatinine Equation (2021)    Anion gap 7 5 -  15    Comment: Performed at [facility], [address] [place], [place] [address]  SARS Coronavirus 2 by RT PCR (hospital order, performed in [facility] hospital lab) *cepheid single result test* Anterior Nasal Swab     Status: None   Collection Time: 04/21/22  9:30 AM   Specimen: Anterior Nasal Swab  Result Value Ref Range   SARS Coronavirus 2 by RT PCR NEGATIVE NEGATIVE    Comment: (NOTE) SARS-CoV-2 target nucleic acids are NOT DETECTED.  The SARS-CoV-2 RNA is generally detectable in upper and lower respiratory specimens during the acute phase of infection. The lowest concentration of SARS-CoV-2 viral copies this assay can detect is 250 copies / mL. A negative result does not preclude SARS-CoV-2 infection and should not be used as the sole basis for treatment or other patient management decisions.  A negative result may occur with improper specimen collection / handling, submission of specimen other than nasopharyngeal swab, presence of viral mutation(s) within the areas targeted by this assay, and inadequate number of  viral copies (<250 copies / mL). A negative result must be combined with clinical observations, patient history, and epidemiological information.  Fact Sheet for Patients:   https://www.patel.info/  Fact Sheet for Healthcare Providers: https://hall.com/  This test is not yet approved or  cleared by the [place] FDA and has been authorized for detection and/or diagnosis of SARS-CoV-2 by FDA under an Emergency Use Authorization (EUA).  This EUA will remain in effect (meaning this test can be used) for the duration of the COVID-19 declaration under Section 564(b)(1) of the Act, 21 U.S.C. section 360bbb-3(b)(1), unless the authorization is terminated or revoked sooner.  Performed at [facility], [place], [place] [address]   Troponin I (High Sensitivity)     Status: None   Collection Time: 04/21/22  9:30 AM  Result Value Ref Range   Troponin I (High Sensitivity) 10 <18 ng/L    Comment: (NOTE) Elevated high sensitivity troponin I (hsTnI) values and significant  changes across serial measurements may suggest ACS but many other  chronic and acute conditions are known to elevate hsTnI results.  Refer to the "Links" section for chest pain algorithms and additional  guidance. Performed at [facility], [address] [place], [place] [address]   Hemoglobin A1c     Status: Abnormal   Collection Time: 04/21/22  9:30 AM  Result Value Ref Range   Hgb A1c MFr Bld 7.0 (H) 4.8 - 5.6 %    Comment: (NOTE) Pre diabetes:          5.7%-6.4%  Diabetes:              >6.4%  Glycemic control for   <7.0% adults with diabetes    Mean Plasma Glucose 154.2 mg/dL    Comment: Performed at [facility] [address], [place], [place] [address]  Urinalysis, Routine w reflex microscopic     Status: Abnormal   Collection Time: 04/21/22  9:56 AM  Result Value Ref Range   Color, Urine YELLOW (A) YELLOW    APPearance HAZY (A) CLEAR   Specific Gravity, Urine 1.014 1.005 - 1.030   pH 5.0 5.0 - 8.0   Glucose, UA >=500 (A) NEGATIVE mg/dL   Hgb urine dipstick SMALL (A) NEGATIVE   Bilirubin Urine NEGATIVE NEGATIVE   Ketones, ur NEGATIVE NEGATIVE mg/dL   Protein, ur >=300 (A) NEGATIVE mg/dL   Nitrite NEGATIVE NEGATIVE   Leukocytes,Ua NEGATIVE NEGATIVE  RBC / HPF 0-5 0 - 5 RBC/hpf   WBC, UA 0-5 0 - 5 WBC/hpf   Bacteria, UA RARE (A) NONE SEEN   Squamous Epithelial / LPF 0-5 0 - 5   Mucus PRESENT    Sperm, UA PRESENT     Comment: Performed at [facility], [place], [place] [address]  Troponin I (High Sensitivity)     Status: None   Collection Time: 04/21/22  1:43 PM  Result Value Ref Range   Troponin I (High Sensitivity) 9 <18 ng/L    Comment: (NOTE) Elevated high sensitivity troponin I (hsTnI) values and significant  changes across serial measurements may suggest ACS but many other  chronic and acute conditions are known to elevate hsTnI results.  Refer to the "Links" section for chest pain algorithms and additional  guidance. Performed at [facility], [address] [place], [place] [address]   Glucose, capillary     Status: None   Collection Time: 04/21/22  4:28 PM  Result Value Ref Range   Glucose-Capillary 94 70 - 99 mg/dL    Comment: Glucose reference range applies only to samples taken after fasting for at least 8 hours.  ECHOCARDIOGRAM COMPLETE     Status: None   Collection Time: 04/21/22  4:30 PM  Result Value Ref Range   Weight 2,296.31 oz   Height 65 in   BP 184/92 mmHg   Ao pk vel 1.05 m/s   AV Area VTI 1.98 cm2   AR max vel 1.94 cm2   AV Mean grad 3.0 mmHg   AV Peak grad 4.4 mmHg   S' Lateral 2.58 cm   AV Area mean vel 1.85 cm2   Area-P 1/2 3.31 cm2  Glucose, capillary     Status: None   Collection Time: 04/21/22  8:18 PM  Result Value Ref Range   Glucose-Capillary 83 70 - 99 mg/dL    Comment: Glucose reference range applies  only to samples taken after fasting for at least 8 hours.  Glucose, capillary     Status: Abnormal   Collection Time: 04/22/22  4:45 AM  Result Value Ref Range   Glucose-Capillary 62 (L) 70 - 99 mg/dL    Comment: Glucose reference range applies only to samples taken after fasting for at least 8 hours.  Lipid panel     Status: None   Collection Time: 04/22/22  5:19 AM  Result Value Ref Range   Cholesterol 128 0 - 200 mg/dL   Triglycerides 91 <150 mg/dL   HDL 47 >40 mg/dL   Total CHOL/HDL Ratio 2.7 RATIO   VLDL 18 0 - 40 mg/dL   LDL Cholesterol 63 0 - 99 mg/dL    Comment:        Total Cholesterol/HDL:CHD Risk Coronary Heart Disease Risk Table                     Men   Women  1/2 Average Risk   3.4   3.3  Average Risk       5.0   4.4  2 X Average Risk   9.6   7.1  3 X Average Risk  23.4   11.0        Use the calculated Patient Ratio above and the CHD Risk Table to determine the patient's CHD Risk.        ATP III CLASSIFICATION (LDL):  <100     mg/dL   Optimal  100-129  mg/dL   Near or Above                    Optimal  130-159  mg/dL   Borderline  160-189  mg/dL   High  >190     mg/dL   Very High Performed at [facility], [address] [place], [place] [address]   Glucose, capillary     Status: Abnormal   Collection Time: 04/22/22  6:35 AM  Result Value Ref Range   Glucose-Capillary 107 (H) 70 - 99 mg/dL    Comment: Glucose reference range applies only to samples taken after fasting for at least 8 hours.  Glucose, capillary     Status: None   Collection Time: 04/22/22  8:37 AM  Result Value Ref Range   Glucose-Capillary 78 70 - 99 mg/dL    Comment: Glucose reference range applies only to samples taken after fasting for at least 8 hours.  Culture, blood (Routine X 2) w Reflex to ID Panel     Status: None   Collection Time: 04/22/22 11:37 AM   Specimen: BLOOD  Result Value Ref Range   Specimen Description BLOOD RIGHT ANTECUBITAL    Special Requests       BOTTLES DRAWN AEROBIC AND ANAEROBIC Blood Culture adequate volume   Culture      NO GROWTH 5 DAYS Performed at [facility], [address] [place], [place] [address]    Report Status 04/27/2022 FINAL   Culture, blood (Routine X 2) w Reflex to ID Panel     Status: None   Collection Time: 04/22/22 11:43 AM   Specimen: BLOOD  Result Value Ref Range   Specimen Description BLOOD BLOOD RIGHT HAND    Special Requests      BOTTLES DRAWN AEROBIC AND ANAEROBIC Blood Culture results may not be optimal due to an excessive volume of blood received in culture bottles   Culture      NO GROWTH 5 DAYS Performed at [facility], [address] [place], [place] [address]    Report Status 04/27/2022 FINAL   Glucose, capillary     Status: None   Collection Time: 04/22/22  2:26 PM  Result Value Ref Range   Glucose-Capillary 85 70 - 99 mg/dL    Comment: Glucose reference range applies only to samples taken after fasting for at least 8 hours.  Glucose, capillary     Status: Abnormal   Collection Time: 04/22/22  4:03 PM  Result Value Ref Range   Glucose-Capillary 132 (H) 70 - 99 mg/dL    Comment: Glucose reference range applies only to samples taken after fasting for at least 8 hours.  Glucose, capillary     Status: None   Collection Time: 04/22/22  8:24 PM  Result Value Ref Range   Glucose-Capillary 91 70 - 99 mg/dL    Comment: Glucose reference range applies only to samples taken after fasting for at least 8 hours.  Basic metabolic panel     Status: Abnormal   Collection Time: 04/23/22  4:15 AM  Result Value Ref Range   Sodium 143 135 - 145 mmol/L   Potassium 3.6 3.5 - 5.1 mmol/L   Chloride 113 (H) 98 - 111 mmol/L   CO2 21 (L) 22 - 32 mmol/L   Glucose, Bld 84 70 - 99 mg/dL    Comment: Glucose reference range applies only to samples taken after fasting for at least 8 hours.   BUN 22 8 -  23 mg/dL   Creatinine, Ser 1.58 (H) 0.61 - 1.24 mg/dL   Calcium 9.2 8.9 - 10.3 mg/dL    GFR, Estimated 48 (L) >60 mL/min    Comment: (NOTE) Calculated using the CKD-EPI Creatinine Equation (2021)    Anion gap 9 5 - 15    Comment: Performed at [facility], [address] [place], [place] [address]  CBC     Status: Abnormal   Collection Time: 04/23/22  4:15 AM  Result Value Ref Range   WBC 5.6 4.0 - 10.5 K/uL   RBC 3.80 (L) 4.22 - 5.81 MIL/uL   Hemoglobin 10.1 (L) 13.0 - 17.0 g/dL   HCT 32.0 (L) 39.0 - 52.0 %   MCV 84.2 80.0 - 100.0 fL   MCH 26.6 26.0 - 34.0 pg   MCHC 31.6 30.0 - 36.0 g/dL   RDW 18.7 (H) 11.5 - 15.5 %   Platelets 257 150 - 400 K/uL   nRBC 0.0 0.0 - 0.2 %    Comment: Performed at [facility], [address] [place], [place] [address]  Glucose, capillary     Status: None   Collection Time: 04/23/22  8:35 AM  Result Value Ref Range   Glucose-Capillary 74 70 - 99 mg/dL    Comment: Glucose reference range applies only to samples taken after fasting for at least 8 hours.  Glucose, capillary     Status: Abnormal   Collection Time: 04/23/22  1:23 PM  Result Value Ref Range   Glucose-Capillary 166 (H) 70 - 99 mg/dL    Comment: Glucose reference range applies only to samples taken after fasting for at least 8 hours.     PHQ2/9:    04/06/2022    4:04 PM 01/13/2022    3:50 PM 11/12/2021    3:41 PM 06/02/2021    2:06 PM 03/20/2021    3:52 PM  Depression screen PHQ 2/9  Decreased Interest 3 3 0 0 0  Down, Depressed, Hopeless 0 0 0 0   PHQ - 2 Score 3 3 0 0 0  Altered sleeping 0 3 0    Tired, decreased energy 3 3 0    Change in appetite 2 0 0    Feeling bad or failure about yourself  0 0 0    Trouble concentrating 0 0 0    Moving slowly or fidgety/restless 3 0 0    Suicidal thoughts 0 0 0    PHQ-9 Score 11 9 0    Difficult doing work/chores Somewhat difficult  Not difficult at all        Fall Risk:    04/06/2022    4:04 PM 01/13/2022    3:50 PM 11/12/2021    3:40 PM 06/20/2021   11:38 AM 06/02/2021    2:05 PM  Fall Risk    Falls in the past year? 0 0 1 0 0  Number falls in past yr: 0 0 0 0 0  Injury with Fall? 0 0 0 0 0  Risk for fall due to : History of fall(s);Impaired balance/gait;Impaired mobility Impaired balance/gait;Impaired mobility Impaired mobility;Impaired balance/gait Impaired mobility;Impaired balance/gait No Fall Risks  Follow up Falls evaluation completed Falls evaluation completed Falls evaluation completed  Falls evaluation completed      Functional Status Survey:      Assessment & Plan

## 2022-05-07 NOTE — Telephone Encounter (Signed)
Luz Brazen with [facility] is calling to request verbal orders for OT for 1 w 2 CB- [phone]

## 2022-05-07 NOTE — Progress Notes (Deleted)
Established Patient Office Visit  Name: Evan MESTER Sr.   MRN: [id number]    DOB: 15-Feb-1956   Date:05/07/2022  Today's Provider: Talitha Givens, MHS, PA-C Introduced myself to the patient as a PA-C and provided education on APPs in clinical practice.         Subjective  Chief Complaint  No chief complaint on file.   HPI   Appears to have had a second stroke per records from 04/21/22 Left pontine stroke with right sided weakness and reduced ambulation    Patient Active Problem List   Diagnosis Date Noted   Acute ischemic stroke ([place]) 04/22/2022   Hypoglycemia 04/22/2022   Slurred speech    Weakness 04/21/2022   CKD stage 3 due to type 2 diabetes mellitus ([place]) 04/21/2022   Hypothermia 01/10/2022   Acute on chronic anemia 01/08/2022   Symptomatic bradycardia 01/07/2022   BPH (benign prostatic hyperplasia) 01/07/2022   GERD without esophagitis 01/07/2022   Paroxysmal atrial fibrillation ([place]) 01/07/2022   Dyslipidemia 01/07/2022   Diabetes mellitus ([place]) 01/01/2022   Acute renal failure ([place]) 01/01/2022   Elevated liver enzymes 12/28/2021   Pneumonia 11/26/2021   Sepsis due to pneumonia ([place]) 11/26/2021   CAP (community acquired pneumonia) 11/26/2021   AKI (acute kidney injury) ([place]) 11/26/2021   Sinus pause    Bradycardia 10/16/2021   Hypertensive emergency without congestive heart failure 09/14/2021   Hypertension    Unstable angina (HCC)    Diabetes mellitus without complication ([place]) 12/75/1700   Primary hypertension 06/27/2021   Diabetic ulcer of toe of left foot associated with type 2 diabetes mellitus, limited to breakdown of skin ([place]) 06/27/2021   Need for pneumococcal vaccination 06/20/2021   Acute CVA (cerebrovascular accident) ([place]) 06/05/2021   Need for influenza vaccination 06/02/2021   Constipation 06/02/2021   Insomnia 06/02/2021   Gastroparesis 02/26/2021   Moderate major depression ([place]) 02/26/2021   Muscle spasm of left lower  extremity 02/26/2021   History of stroke 02/26/2021   Type 2 diabetes mellitus with hyperglycemia ([place]) 17/49/4496   Diastolic dysfunction    History of diabetes with ketoacidosis 09/20/2020   CKD (chronic kidney disease) stage 2, GFR 60-89 ml/min 06/27/2020   Monitoring for anticoagulant use 06/27/2020   Lower extremity edema 05/16/2020   Atrial fibrillation, chronic ([place]) 10/26/2019   Anemia of chronic disease 09/05/2019   Acute kidney injury superimposed on CKD ([place]) 09/05/2019   Coagulopathy ([place]) 06/12/2019   Gastroesophageal reflux disease    Hemiplegia and hemiparesis following cerebral infarction affecting left non-dominant side ([place]) 11/25/2018   RBBB 10/15/2018   Dysphagia, post-stroke    Microalbuminuria 07/29/2018   Hyperlipidemia LDL goal <70 02/11/2017   Recurrent strokes ([place]) 11/10/2016   Essential hypertension 11/10/2016   Uncontrolled type 2 diabetes mellitus with hyperglycemia, with long-term current use of insulin ([place]) 11/10/2016   Tobacco abuse 08/28/2013   Peripheral neuropathy 08/28/2013    Past Surgical History:  Procedure Laterality Date   ESOPHAGOGASTRODUODENOSCOPY (EGD) WITH PROPOFOL N/A 04/26/2019   Procedure: ESOPHAGOGASTRODUODENOSCOPY (EGD) WITH PROPOFOL;  Surgeon: Lin Landsman, MD;  Location: [facility] ENDOSCOPY;  Service: Gastroenterology;  Laterality: N/A;   LEFT HEART CATH AND CORONARY ANGIOGRAPHY N/A 01/09/2022   Procedure: LEFT HEART CATH AND CORONARY ANGIOGRAPHY;  Surgeon: Nelva Bush, MD;  Location: [facility] CV LAB;  Service: Cardiovascular;  Laterality: N/A;   NO PAST SURGERIES     TEE WITHOUT CARDIOVERSION N/A 04/22/2022   Procedure: TRANSESOPHAGEAL ECHOCARDIOGRAM (TEE);  Surgeon: Kate Sable, MD;  Location: [facility] ORS;  Service: Cardiovascular;  Laterality: N/A;    Family History  Problem Relation Age of Onset   Hypertension Mother    Stroke Mother        died @ age 58   Hypertension Father    Diabetes Father    Heart  attack Father        died @ 51    Social History   Tobacco Use   Smoking status: Former    Packs/day: 1.00    Years: 38.00    Total pack years: 38.00    Types: Cigarettes    Start date: 1982   Smokeless tobacco: Never  Substance Use Topics   Alcohol use: Not Currently    Alcohol/week: 1.0 standard drink of alcohol    Types: 1 Cans of beer per week    Comment: previously drank but nothing in 1-2 yrs (08/2019).     Current Outpatient Medications:    ACCU-CHEK GUIDE test strip, Check fsbs once daily, Disp: 100 each, Rfl: 2   Accu-Chek Softclix Lancets lancets, SMARTSIG:Topical, Disp: 100 each, Rfl: 1   albuterol (VENTOLIN HFA) 108 (90 Base) MCG/ACT inhaler, Inhale 2 puffs into the lungs every 6 (six) hours as needed for wheezing or shortness of breath., Disp: 8 g, Rfl: 1   amLODipine (NORVASC) 10 MG tablet, TAKE 1 TABLET BY MOUTH DAILY, Disp: 30 tablet, Rfl: 0   apixaban (ELIQUIS) 5 MG TABS tablet, Take 1 tablet (5 mg total) by mouth 2 (two) times daily. Need visit for further refills., Disp: 120 tablet, Rfl: 0   atorvastatin (LIPITOR) 80 MG tablet, TAKE 1 TABLET BY MOUTH EVERY EVENING, Disp: 30 tablet, Rfl: 0   Blood Glucose Monitoring Suppl KIT, Accucheck Guide with lancets and test strips #100 with one refill.  Test blood sugar twice daily. DX Code E11.65 ; Z79.4, Disp: 1 kit, Rfl: 0   Continuous Blood Gluc Sensor (FREESTYLE LIBRE 2 SENSOR) MISC, 1 each by Does not apply route every 14 (fourteen) days., Disp: 6 each, Rfl: 1   dapagliflozin propanediol (FARXIGA) 10 MG TABS tablet, Take 1 tablet (10 mg total) by mouth daily before breakfast. In place of glipizide, Disp: 30 tablet, Rfl: 0   Incontinence Supply Disposable (COMFORT PROTECT ADULT DIAPER/L) MISC, 1 Units by Does not apply route daily. Use 3-4 a day as needed, Disp: 90 each, Rfl: 3   Insulin Pen Needle 32G X 6 MM MISC, Daily, Disp: 100 each, Rfl: 3   iron polysaccharides (NIFEREX) 150 MG capsule, Take 1 capsule (150 mg  total) by mouth daily. Can take any form of over-the-counter iron supplement., Disp: , Rfl:    losartan (COZAAR) 25 MG tablet, Take 1 tablet (25 mg total) by mouth daily., Disp: 30 tablet, Rfl: 1   melatonin 1 MG TABS tablet, Take 3 tablets (3 mg total) by mouth at bedtime as needed., Disp: 30 tablet, Rfl: 4   metoCLOPramide (REGLAN) 5 MG tablet, TAKE 1 TABLET BY MOUTH 3 TIMES DAILY BEFORE MEALS, Disp: 90 tablet, Rfl: 0   mirtazapine (REMERON) 7.5 MG tablet, Take 1 tablet (7.5 mg total) by mouth at bedtime., Disp: 30 tablet, Rfl: 3   Misc. Devices MISC, One pair of Compression stockings  Hemiplegia and hemiparesis following cerebral infarction affecting left non-dominant side ([place])  - Primary Codes: V78.588 Lower extremity edema  Codes: R60.0 Type 2 diabetes mellitus with hyperglycemia, with long-term current use of insulin ([place])  Codes: E11.65, Z79.4, Disp: 1  Units, Rfl: 0   nitroGLYCERIN (NITROSTAT) 0.4 MG SL tablet, Place 1 tablet (0.4 mg total) under the tongue every 5 (five) minutes as needed for chest pain., Disp: 30 tablet, Rfl: 0   omeprazole (PRILOSEC) 40 MG capsule, TAKE 1 CAPSULE BY MOUTH IN THE MORNING AND AT BEDTIME, Disp: 60 capsule, Rfl: 0   tamsulosin (FLOMAX) 0.4 MG CAPS capsule, Take 1 capsule (0.4 mg total) by mouth daily., Disp: 90 capsule, Rfl: 3  No Known Allergies  I personally reviewed {Reviewed:[id number]} with the patient/caregiver today.   ROS    Objective  There were no vitals filed for this visit.  There is no height or weight on file to calculate BMI.  Physical Exam   Recent Results (from the past 2160 hour(s))  Bladder Scan (Post Void Residual) in office     Status: None   Collection Time: 02/25/22  9:07 AM  Result Value Ref Range   Scan Result 265m   Protime-INR     Status: None   Collection Time: 04/21/22  9:30 AM  Result Value Ref Range   Prothrombin Time 13.6 11.4 - 15.2 seconds   INR 1.1 0.8 - 1.2    Comment: (NOTE) INR goal varies based on  device and disease states. Performed at [facility] [address], [place] [id number]  APTT     Status: Abnormal   Collection Time: 04/21/22  9:30 AM  Result Value Ref Range   aPTT 37 (H) 24 - 36 seconds    Comment:        IF BASELINE aPTT IS ELEVATED, SUGGEST PATIENT RISK ASSESSMENT BE USED TO DETERMINE APPROPRIATE ANTICOAGULANT THERAPY. Performed at [facility] [address], [place] [address]  CBC     Status: Abnormal   Collection Time: 04/21/22  9:30 AM  Result Value Ref Range   WBC 5.6 4.0 - 10.5 K/uL   RBC 4.00 (L) 4.22 - 5.81 MIL/uL   Hemoglobin 10.6 (L) 13.0 - 17.0 g/dL   HCT 33.5 (L) 39.0 - 52.0 %   MCV 83.8 80.0 - 100.0 fL   MCH 26.5 26.0 - 34.0 pg   MCHC 31.6 30.0 - 36.0 g/dL   RDW 19.0 (H) 11.5 - 15.5 %   Platelets 267 150 - 400 K/uL   nRBC 0.0 0.0 - 0.2 %    Comment: Performed at [facility] [address], [place] [address] Differential     Status: None   Collection Time: 04/21/22  9:30 AM  Result Value Ref Range   Neutrophils Relative % 54 %   Neutro Abs 3.0 1.7 - 7.7 K/uL   Lymphocytes Relative 35 %   Lymphs Abs 2.0 0.7 - 4.0 K/uL   Monocytes Relative 9 %   Monocytes Absolute 0.5 0.1 - 1.0 K/uL   Eosinophils Relative 1 %   Eosinophils Absolute 0.1 0.0 - 0.5 K/uL   Basophils Relative 1 %   Basophils Absolute 0.0 0.0 - 0.1 K/uL   Immature Granulocytes 0 %   Abs Immature Granulocytes 0.01 0.00 - 0.07 K/uL    Comment: Performed at [facility] [address], [place] [address] Comprehensive metabolic panel     Status: Abnormal   Collection Time: 04/21/22  9:30 AM  Result Value Ref Range   Sodium 141 135 - 145 mmol/L   Potassium 3.8 3.5 - 5.1 mmol/L   Chloride 113 (H) 98 - 111 mmol/L  CO2 21 (L) 22 - 32 mmol/L   Glucose, Bld 114 (H) 70 - 99 mg/dL    Comment: Glucose reference range applies only to samples taken after fasting for at least 8 hours.   BUN 27 (H) 8 -  23 mg/dL   Creatinine, Ser 1.82 (H) 0.61 - 1.24 mg/dL   Calcium 9.3 8.9 - 10.3 mg/dL   Total Protein 8.1 6.5 - 8.1 g/dL   Albumin 3.7 3.5 - 5.0 g/dL   AST 29 15 - 41 U/L   ALT 47 (H) 0 - 44 U/L   Alkaline Phosphatase 164 (H) 38 - 126 U/L   Total Bilirubin 0.7 0.3 - 1.2 mg/dL   GFR, Estimated 41 (L) >60 mL/min    Comment: (NOTE) Calculated using the CKD-EPI Creatinine Equation (2021)    Anion gap 7 5 - 15    Comment: Performed at [facility], [address] [place], [place] [address]  SARS Coronavirus 2 by RT PCR (hospital order, performed in [facility] hospital lab) *cepheid single result test* Anterior Nasal Swab     Status: None   Collection Time: 04/21/22  9:30 AM   Specimen: Anterior Nasal Swab  Result Value Ref Range   SARS Coronavirus 2 by RT PCR NEGATIVE NEGATIVE    Comment: (NOTE) SARS-CoV-2 target nucleic acids are NOT DETECTED.  The SARS-CoV-2 RNA is generally detectable in upper and lower respiratory specimens during the acute phase of infection. The lowest concentration of SARS-CoV-2 viral copies this assay can detect is 250 copies / mL. A negative result does not preclude SARS-CoV-2 infection and should not be used as the sole basis for treatment or other patient management decisions.  A negative result may occur with improper specimen collection / handling, submission of specimen other than nasopharyngeal swab, presence of viral mutation(s) within the areas targeted by this assay, and inadequate number of viral copies (<250 copies / mL). A negative result must be combined with clinical observations, patient history, and epidemiological information.  Fact Sheet for Patients:   https://www.patel.info/  Fact Sheet for Healthcare Providers: https://hall.com/  This test is not yet approved or  cleared by the [place] FDA and has been authorized for detection and/or diagnosis of SARS-CoV-2 by FDA under an  Emergency Use Authorization (EUA).  This EUA will remain in effect (meaning this test can be used) for the duration of the COVID-19 declaration under Section 564(b)(1) of the Act, 21 U.S.C. section 360bbb-3(b)(1), unless the authorization is terminated or revoked sooner.  Performed at [facility], [place], [place] [address]   Troponin I (High Sensitivity)     Status: None   Collection Time: 04/21/22  9:30 AM  Result Value Ref Range   Troponin I (High Sensitivity) 10 <18 ng/L    Comment: (NOTE) Elevated high sensitivity troponin I (hsTnI) values and significant  changes across serial measurements may suggest ACS but many other  chronic and acute conditions are known to elevate hsTnI results.  Refer to the "Links" section for chest pain algorithms and additional  guidance. Performed at [facility], [address] [place], [place] [address]   Hemoglobin A1c     Status: Abnormal   Collection Time: 04/21/22  9:30 AM  Result Value Ref Range   Hgb A1c MFr Bld 7.0 (H) 4.8 - 5.6 %    Comment: (NOTE) Pre diabetes:          5.7%-6.4%  Diabetes:              >  6.4%  Glycemic control for   <7.0% adults with diabetes    Mean Plasma Glucose 154.2 mg/dL    Comment: Performed at [facility] [address], [place], [place] [address]  Urinalysis, Routine w reflex microscopic     Status: Abnormal   Collection Time: 04/21/22  9:56 AM  Result Value Ref Range   Color, Urine YELLOW (A) YELLOW   APPearance HAZY (A) CLEAR   Specific Gravity, Urine 1.014 1.005 - 1.030   pH 5.0 5.0 - 8.0   Glucose, UA >=500 (A) NEGATIVE mg/dL   Hgb urine dipstick SMALL (A) NEGATIVE   Bilirubin Urine NEGATIVE NEGATIVE   Ketones, ur NEGATIVE NEGATIVE mg/dL   Protein, ur >=300 (A) NEGATIVE mg/dL   Nitrite NEGATIVE NEGATIVE   Leukocytes,Ua NEGATIVE NEGATIVE   RBC / HPF 0-5 0 - 5 RBC/hpf   WBC, UA 0-5 0 - 5 WBC/hpf   Bacteria, UA RARE (A) NONE SEEN   Squamous  Epithelial / LPF 0-5 0 - 5   Mucus PRESENT    Sperm, UA PRESENT     Comment: Performed at [facility], [place], [place] [address]  Troponin I (High Sensitivity)     Status: None   Collection Time: 04/21/22  1:43 PM  Result Value Ref Range   Troponin I (High Sensitivity) 9 <18 ng/L    Comment: (NOTE) Elevated high sensitivity troponin I (hsTnI) values and significant  changes across serial measurements may suggest ACS but many other  chronic and acute conditions are known to elevate hsTnI results.  Refer to the "Links" section for chest pain algorithms and additional  guidance. Performed at [facility], [address] [place], [place] [address]   Glucose, capillary     Status: None   Collection Time: 04/21/22  4:28 PM  Result Value Ref Range   Glucose-Capillary 94 70 - 99 mg/dL    Comment: Glucose reference range applies only to samples taken after fasting for at least 8 hours.  ECHOCARDIOGRAM COMPLETE     Status: None   Collection Time: 04/21/22  4:30 PM  Result Value Ref Range   Weight 2,296.31 oz   Height 65 in   BP 184/92 mmHg   Ao pk vel 1.05 m/s   AV Area VTI 1.98 cm2   AR max vel 1.94 cm2   AV Mean grad 3.0 mmHg   AV Peak grad 4.4 mmHg   S' Lateral 2.58 cm   AV Area mean vel 1.85 cm2   Area-P 1/2 3.31 cm2  Glucose, capillary     Status: None   Collection Time: 04/21/22  8:18 PM  Result Value Ref Range   Glucose-Capillary 83 70 - 99 mg/dL    Comment: Glucose reference range applies only to samples taken after fasting for at least 8 hours.  Glucose, capillary     Status: Abnormal   Collection Time: 04/22/22  4:45 AM  Result Value Ref Range   Glucose-Capillary 62 (L) 70 - 99 mg/dL    Comment: Glucose reference range applies only to samples taken after fasting for at least 8 hours.  Lipid panel     Status: None   Collection Time: 04/22/22  5:19 AM  Result Value Ref Range   Cholesterol 128 0 - 200 mg/dL   Triglycerides 91 <150  mg/dL   HDL 47 >40 mg/dL   Total CHOL/HDL Ratio 2.7 RATIO   VLDL 18 0 - 40 mg/dL   LDL Cholesterol  63 0 - 99 mg/dL    Comment:        Total Cholesterol/HDL:CHD Risk Coronary Heart Disease Risk Table                     Men   Women  1/2 Average Risk   3.4   3.3  Average Risk       5.0   4.4  2 X Average Risk   9.6   7.1  3 X Average Risk  23.4   11.0        Use the calculated Patient Ratio above and the CHD Risk Table to determine the patient's CHD Risk.        ATP III CLASSIFICATION (LDL):  <100     mg/dL   Optimal  100-129  mg/dL   Near or Above                    Optimal  130-159  mg/dL   Borderline  160-189  mg/dL   High  >190     mg/dL   Very High Performed at [facility], [address] [place], [place] [address]   Glucose, capillary     Status: Abnormal   Collection Time: 04/22/22  6:35 AM  Result Value Ref Range   Glucose-Capillary 107 (H) 70 - 99 mg/dL    Comment: Glucose reference range applies only to samples taken after fasting for at least 8 hours.  Glucose, capillary     Status: None   Collection Time: 04/22/22  8:37 AM  Result Value Ref Range   Glucose-Capillary 78 70 - 99 mg/dL    Comment: Glucose reference range applies only to samples taken after fasting for at least 8 hours.  Culture, blood (Routine X 2) w Reflex to ID Panel     Status: None   Collection Time: 04/22/22 11:37 AM   Specimen: BLOOD  Result Value Ref Range   Specimen Description BLOOD RIGHT ANTECUBITAL    Special Requests      BOTTLES DRAWN AEROBIC AND ANAEROBIC Blood Culture adequate volume   Culture      NO GROWTH 5 DAYS Performed at [facility], [address] [place], [place] [address]    Report Status 04/27/2022 FINAL   Culture, blood (Routine X 2) w Reflex to ID Panel     Status: None   Collection Time: 04/22/22 11:43 AM   Specimen: BLOOD  Result Value Ref Range   Specimen Description BLOOD BLOOD RIGHT HAND    Special Requests      BOTTLES DRAWN  AEROBIC AND ANAEROBIC Blood Culture results may not be optimal due to an excessive volume of blood received in culture bottles   Culture      NO GROWTH 5 DAYS Performed at [facility], [address], [place], [place] [address]    Report Status 04/27/2022 FINAL   Glucose, capillary     Status: None   Collection Time: 04/22/22  2:26 PM  Result Value Ref Range   Glucose-Capillary 85 70 - 99 mg/dL    Comment: Glucose reference range applies only to samples taken after fasting for at least 8 hours.  Glucose, capillary     Status: Abnormal   Collection Time: 04/22/22  4:03 PM  Result Value Ref Range   Glucose-Capillary 132 (H) 70 - 99 mg/dL    Comment: Glucose reference range applies only to samples taken after fasting for at least 8 hours.  Glucose, capillary     Status: None   Collection Time: 04/22/22  8:24 PM  Result Value Ref Range   Glucose-Capillary 91 70 - 99 mg/dL    Comment: Glucose reference range applies only to samples taken after fasting for at least 8 hours.  Basic metabolic panel     Status: Abnormal   Collection Time: 04/23/22  4:15 AM  Result Value Ref Range   Sodium 143 135 - 145 mmol/L   Potassium 3.6 3.5 - 5.1 mmol/L   Chloride 113 (H) 98 - 111 mmol/L   CO2 21 (L) 22 - 32 mmol/L   Glucose, Bld 84 70 - 99 mg/dL    Comment: Glucose reference range applies only to samples taken after fasting for at least 8 hours.   BUN 22 8 - 23 mg/dL   Creatinine, Ser 1.58 (H) 0.61 - 1.24 mg/dL   Calcium 9.2 8.9 - 10.3 mg/dL   GFR, Estimated 48 (L) >60 mL/min    Comment: (NOTE) Calculated using the CKD-EPI Creatinine Equation (2021)    Anion gap 9 5 - 15    Comment: Performed at [facility], [address] [place], [place] [address]  CBC     Status: Abnormal   Collection Time: 04/23/22  4:15 AM  Result Value Ref Range   WBC 5.6 4.0 - 10.5 K/uL   RBC 3.80 (L) 4.22 - 5.81 MIL/uL   Hemoglobin 10.1 (L) 13.0 - 17.0 g/dL   HCT 32.0 (L) 39.0 - 52.0 %   MCV  84.2 80.0 - 100.0 fL   MCH 26.6 26.0 - 34.0 pg   MCHC 31.6 30.0 - 36.0 g/dL   RDW 18.7 (H) 11.5 - 15.5 %   Platelets 257 150 - 400 K/uL   nRBC 0.0 0.0 - 0.2 %    Comment: Performed at  [facility], [address] [place], [place] [address]  Glucose, capillary     Status: None   Collection Time: 04/23/22  8:35 AM  Result Value Ref Range   Glucose-Capillary 74 70 - 99 mg/dL    Comment: Glucose reference range applies only to samples taken after fasting for at least 8 hours.  Glucose, capillary     Status: Abnormal   Collection Time: 04/23/22  1:23 PM  Result Value Ref Range   Glucose-Capillary 166 (H) 70 - 99 mg/dL    Comment: Glucose reference range applies only to samples taken after fasting for at least 8 hours.     PHQ2/9:    04/06/2022    4:04 PM 01/13/2022    3:50 PM 11/12/2021    3:41 PM 06/02/2021    2:06 PM 03/20/2021    3:52 PM  Depression screen PHQ 2/9  Decreased Interest 3 3 0 0 0  Down, Depressed, Hopeless 0 0 0 0   PHQ - 2 Score 3 3 0 0 0  Altered sleeping 0 3 0    Tired, decreased energy 3 3 0    Change in appetite 2 0 0    Feeling bad or failure about yourself  0 0 0    Trouble concentrating 0 0 0    Moving slowly or fidgety/restless 3 0 0    Suicidal thoughts 0 0 0    PHQ-9 Score 11 9 0    Difficult doing work/chores Somewhat difficult  Not difficult at all        Fall Risk:    04/06/2022    4:04 PM 01/13/2022  3:50 PM 11/12/2021    3:40 PM 06/20/2021   11:38 AM 06/02/2021    2:05 PM  Fall Risk   Falls in the past year? 0 0 1 0 0  Number falls in past yr: 0 0 0 0 0  Injury with Fall? 0 0 0 0 0  Risk for fall due to : History of fall(s);Impaired balance/gait;Impaired mobility Impaired balance/gait;Impaired mobility Impaired mobility;Impaired balance/gait Impaired mobility;Impaired balance/gait No Fall Risks  Follow up Falls evaluation completed Falls evaluation completed Falls evaluation completed  Falls evaluation completed       Functional Status Survey:      Assessment & Plan

## 2022-05-08 DIAGNOSIS — I110 Hypertensive heart disease with heart failure: Secondary | ICD-10-CM | POA: Diagnosis not present

## 2022-05-08 DIAGNOSIS — I69351 Hemiplegia and hemiparesis following cerebral infarction affecting right dominant side: Secondary | ICD-10-CM | POA: Diagnosis not present

## 2022-05-08 DIAGNOSIS — Z87891 Personal history of nicotine dependence: Secondary | ICD-10-CM | POA: Diagnosis not present

## 2022-05-08 DIAGNOSIS — Z7901 Long term (current) use of anticoagulants: Secondary | ICD-10-CM | POA: Diagnosis not present

## 2022-05-08 DIAGNOSIS — Z7984 Long term (current) use of oral hypoglycemic drugs: Secondary | ICD-10-CM | POA: Diagnosis not present

## 2022-05-08 DIAGNOSIS — E1365 Other specified diabetes mellitus with hyperglycemia: Secondary | ICD-10-CM | POA: Diagnosis not present

## 2022-05-08 DIAGNOSIS — E7800 Pure hypercholesterolemia, unspecified: Secondary | ICD-10-CM | POA: Diagnosis not present

## 2022-05-08 DIAGNOSIS — Z9181 History of falling: Secondary | ICD-10-CM | POA: Diagnosis not present

## 2022-05-08 DIAGNOSIS — N1831 Chronic kidney disease, stage 3a: Secondary | ICD-10-CM | POA: Diagnosis not present

## 2022-05-08 DIAGNOSIS — E1322 Other specified diabetes mellitus with diabetic chronic kidney disease: Secondary | ICD-10-CM | POA: Diagnosis not present

## 2022-05-08 DIAGNOSIS — I480 Paroxysmal atrial fibrillation: Secondary | ICD-10-CM | POA: Diagnosis not present

## 2022-05-08 DIAGNOSIS — I639 Cerebral infarction, unspecified: Secondary | ICD-10-CM | POA: Diagnosis not present

## 2022-05-08 DIAGNOSIS — I6523 Occlusion and stenosis of bilateral carotid arteries: Secondary | ICD-10-CM | POA: Diagnosis not present

## 2022-05-08 DIAGNOSIS — I69354 Hemiplegia and hemiparesis following cerebral infarction affecting left non-dominant side: Secondary | ICD-10-CM | POA: Diagnosis not present

## 2022-05-08 DIAGNOSIS — I5030 Unspecified diastolic (congestive) heart failure: Secondary | ICD-10-CM | POA: Diagnosis not present

## 2022-05-08 DIAGNOSIS — I69322 Dysarthria following cerebral infarction: Secondary | ICD-10-CM | POA: Diagnosis not present

## 2022-05-08 NOTE — Assessment & Plan Note (Signed)
Chronic, historic condition  Wife reports he needs evaluation for diabetic footwear Will refer to Podiatry to assist with evaluation and recommendations for DM footwear that is appropriate  Follow up as needed Would like to recheck A1c at next visit and discuss medications

## 2022-05-08 NOTE — Assessment & Plan Note (Signed)
Chronic, ongoing concern Reports insomnia from leg pain/ neuropathy Will send refill for Mirtazapine 7.5 mg PO QHS to assist with sleep Follow up as needed

## 2022-05-08 NOTE — Assessment & Plan Note (Signed)
Hx of strokes that have resulted in left-sided hemiparesis and weakness He was recently discharged from hospital following acute left pontine stroke  He has resumed Eliquis 5 mg PO BID per Neurology  Will make new referral to Neurology for monitoring and evaluation I have sent referrals to allow for ongoing PT, OT, speech therapy to assist with deficits Will collaborate with Neurology as indicated Follow up for continued collaboration and ongoing concerns.

## 2022-05-08 NOTE — Assessment & Plan Note (Signed)
Chronic, historic concern Likely secondary to multiple strokes and DM  He was previously on gabapentin - appears to have been dc in 2022 for unknown reasons Reviewed GFR and should be able to tolerate if he wishes to resume Recommend discussing at follow up as well as DM management

## 2022-05-08 NOTE — Progress Notes (Signed)
Established Patient Office Visit  Name: Evan BART Sr.   MRN: [id number]    DOB: 03-Feb-1956   Date:05/08/2022  Today's Provider: Talitha Givens, MHS, PA-C Introduced myself to the patient as a PA-C and provided education on APPs in clinical practice.         Subjective  Chief Complaint  Chief Complaint  Patient presents with   [facility] orders    Patient needs home health orders for PT and OT. Patient would like to have an order for a feeding table that can be sent to a DME supplier that delivers.   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 and reduced ambulation  Reports bilateral leg pain States pain is worse at night  States it aches and hurts  Thinks it is nerve pain    Patient Active Problem List   Diagnosis Date Noted   Acute ischemic stroke ([place]) 04/22/2022   Hypoglycemia 04/22/2022   Slurred speech    Weakness 04/21/2022   CKD stage 3 due to type 2 diabetes mellitus ([place]) 04/21/2022   Hypothermia 01/10/2022   Acute on chronic anemia 01/08/2022   Symptomatic bradycardia 01/07/2022   BPH (benign prostatic hyperplasia) 01/07/2022   GERD without esophagitis 01/07/2022   Paroxysmal atrial fibrillation ([place]) 01/07/2022   Dyslipidemia 01/07/2022   Diabetes mellitus ([place]) 01/01/2022   Acute renal failure ([place]) 01/01/2022   Elevated liver enzymes 12/28/2021   Pneumonia 11/26/2021   Sepsis due to pneumonia ([place]) 11/26/2021   CAP (community acquired pneumonia) 11/26/2021   AKI (acute kidney injury) ([place]) 11/26/2021    Sinus pause    Bradycardia 10/16/2021   Hypertensive emergency without congestive heart failure 09/14/2021   Hypertension    Unstable angina (HCC)    Diabetes mellitus without complication ([place]) 65/20/2334   Primary hypertension 06/27/2021   Diabetic ulcer of toe of left foot associated with type 2 diabetes mellitus, limited to breakdown of skin ([place]) 06/27/2021   Need for pneumococcal vaccination 06/20/2021   Acute CVA (cerebrovascular accident) ([place]) 06/05/2021   Need for influenza vaccination 06/02/2021   Constipation 06/02/2021   Insomnia 06/02/2021   Gastroparesis 02/26/2021   Moderate major depression ([place]) 02/26/2021   Muscle spasm of left lower extremity 02/26/2021   History of stroke 02/26/2021   Type 2 diabetes mellitus with hyperglycemia ([place]) 65/68/6168   Diastolic dysfunction    History of diabetes with ketoacidosis 09/20/2020   CKD (chronic kidney disease) stage 2, GFR 60-89 ml/min 06/27/2020   Monitoring for anticoagulant use 06/27/2020   Lower extremity edema 05/16/2020   Atrial fibrillation, chronic ([place]) 10/26/2019   Anemia of chronic disease 09/05/2019   Acute kidney injury superimposed on CKD ([place]) 09/05/2019   Coagulopathy ([place]) 06/12/2019   Gastroesophageal reflux disease    Hemiplegia and hemiparesis following cerebral infarction affecting left non-dominant side ([place]) 11/25/2018   RBBB 10/15/2018   Dysphagia, post-stroke    Microalbuminuria 07/29/2018  Hyperlipidemia LDL goal <70 02/11/2017   Recurrent strokes ([place]) 11/10/2016   Essential hypertension 11/10/2016   Uncontrolled type 2 diabetes mellitus with hyperglycemia, with long-term current use of insulin ([place]) 65/27/2018   Tobacco abuse 08/28/2013   Peripheral neuropathy 08/28/2013    Past Surgical History:  Procedure Laterality Date   ESOPHAGOGASTRODUODENOSCOPY (EGD) WITH PROPOFOL N/A 04/26/2019   Procedure: ESOPHAGOGASTRODUODENOSCOPY (EGD) WITH PROPOFOL;  Surgeon: Lin Landsman, MD;   Location: [facility];  Service: Gastroenterology;  Laterality: N/A;   LEFT HEART CATH AND CORONARY ANGIOGRAPHY N/A 01/09/2022   Procedure: LEFT HEART CATH AND CORONARY ANGIOGRAPHY;  Surgeon: Nelva Bush, MD;  Location: [facility];  Service: Cardiovascular;  Laterality: N/A;   NO PAST SURGERIES     TEE WITHOUT CARDIOVERSION N/A 04/22/2022   Procedure: TRANSESOPHAGEAL ECHOCARDIOGRAM (TEE);  Surgeon: Kate Sable, MD;  Location: [facility];  Service: Cardiovascular;  Laterality: N/A;    Family History  Problem Relation Age of Onset   Hypertension Mother    Stroke Mother        died @ age 34   Hypertension Father    Diabetes Father    Heart attack Father        died @ 82    Social History   Tobacco Use   Smoking status: Former    Packs/day: 1.00    Years: 38.00    Total pack years: 38.00    Types: Cigarettes    Start date: 1982   Smokeless tobacco: Never  Substance Use Topics   Alcohol use: Not Currently    Alcohol/week: 1.0 standard drink of alcohol    Types: 1 Cans of beer per week    Comment: previously drank but nothing in 1-2 yrs (08/2019).     Current Outpatient Medications:    ACCU-CHEK GUIDE test strip, Check fsbs once daily, Disp: 100 each, Rfl: 2   Accu-Chek Softclix Lancets lancets, SMARTSIG:Topical, Disp: 100 each, Rfl: 1   albuterol (VENTOLIN HFA) 108 (90 Base) MCG/ACT inhaler, Inhale 2 puffs into the lungs every 6 (six) hours as needed for wheezing or shortness of breath., Disp: 8 g, Rfl: 1   amLODipine (NORVASC) 10 MG tablet, TAKE 1 TABLET BY MOUTH DAILY, Disp: 30 tablet, Rfl: 0   apixaban (ELIQUIS) 5 MG TABS tablet, Take 1 tablet (5 mg total) by mouth 2 (two) times daily. Need visit for further refills., Disp: 120 tablet, Rfl: 0   atorvastatin (LIPITOR) 80 MG tablet, TAKE 1 TABLET BY MOUTH EVERY EVENING, Disp: 30 tablet, Rfl: 0   Blood Glucose Monitoring Suppl KIT, Accucheck Guide with lancets and test strips #100 with one refill.  Test blood  sugar twice daily. DX Code E11.65 ; Z79.4, Disp: 1 kit, Rfl: 0   Continuous Blood Gluc Sensor (FREESTYLE LIBRE 2 SENSOR) MISC, 1 each by Does not apply route every 14 (fourteen) days., Disp: 6 each, Rfl: 1   dapagliflozin propanediol (FARXIGA) 10 MG TABS tablet, Take 1 tablet (10 mg total) by mouth daily before breakfast. In place of glipizide, Disp: 30 tablet, Rfl: 0   Elastic Bandages & Supports (MEDICAL COMPRESSION STOCKINGS) MISC, 1 Package by Does not apply route in the morning., Disp: 8 each, Rfl: 0   Insulin Pen Needle 32G X 6 MM MISC, Daily, Disp: 100 each, Rfl: 3   iron polysaccharides (NIFEREX) 150 MG capsule, Take 1 capsule (150 mg total) by mouth daily. Can take any form of over-the-counter iron supplement., Disp: , Rfl:    losartan (COZAAR)  25 MG tablet, Take 1 tablet (25 mg total) by mouth daily., Disp: 30 tablet, Rfl: 1   melatonin 1 MG TABS tablet, Take 3 tablets (3 mg total) by mouth at bedtime as needed., Disp: 30 tablet, Rfl: 4   metoCLOPramide (REGLAN) 5 MG tablet, TAKE 1 TABLET BY MOUTH 3 TIMES DAILY BEFORE MEALS, Disp: 90 tablet, Rfl: 0   Misc. Devices MISC, One pair of Compression stockings  Hemiplegia and hemiparesis following cerebral infarction affecting left non-dominant side (HCC)  - Primary Codes: P37.902 Lower extremity edema  Codes: R60.0 Type 2 diabetes mellitus with hyperglycemia, with long-term current use of insulin (HCC)  Codes: E11.65, Z79.4, Disp: 1 Units, Rfl: 0   nitroGLYCERIN (NITROSTAT) 0.4 MG SL tablet, Place 1 tablet (0.4 mg total) under the tongue every 5 (five) minutes as needed for chest pain., Disp: 30 tablet, Rfl: 0   omeprazole (PRILOSEC) 40 MG capsule, TAKE 1 CAPSULE BY MOUTH IN THE MORNING AND AT BEDTIME, Disp: 60 capsule, Rfl: 0   tamsulosin (FLOMAX) 0.4 MG CAPS capsule, Take 1 capsule (0.4 mg total) by mouth daily., Disp: 90 capsule, Rfl: 3   Incontinence Supply Disposable (COMFORT PROTECT ADULT DIAPER/L) MISC, 1 Units by Does not apply route daily.  Use 3-4 a day as needed, Disp: 90 each, Rfl: 3   mirtazapine (REMERON) 7.5 MG tablet, Take 1 tablet (7.5 mg total) by mouth at bedtime., Disp: 30 tablet, Rfl: 2  No Known Allergies  I personally reviewed active problem list, medication list, allergies, notes from last encounter, notes from last 5 encounters, lab results with the patient/caregiver today.   Review of Systems  Constitutional:  Positive for chills. Negative for fever.  Eyes:  Positive for blurred vision. Negative for double vision.  Respiratory:  Negative for shortness of breath and wheezing.   Cardiovascular:  Negative for chest pain.  Musculoskeletal:  Positive for myalgias. Negative for falls.  Neurological:  Positive for tremors and weakness. Negative for headaches.      Objective  Vitals:   05/07/22 1549 05/07/22 1554  BP: (!) 155/79 (!) 151/78  Pulse: 63 63  Temp: 97.6 F (36.4 C)   TempSrc: Oral   SpO2: 97%     There is no height or weight on file to calculate BMI.  Physical Exam Vitals reviewed.  Constitutional:      General: He is awake.     Appearance: Normal appearance. He is well-developed and well-groomed.  HENT:     Head: Normocephalic and atraumatic.  Eyes:     General: Lids are normal. Gaze aligned appropriately.     Extraocular Movements: Extraocular movements intact.     Conjunctiva/sclera: Conjunctivae normal.     Pupils: Pupils are equal, round, and reactive to light.  Pulmonary:     Effort: Pulmonary effort is normal.  Neurological:     Mental Status: He is alert. Mental status is at baseline.     GCS: GCS eye subscore is 4. GCS verbal subscore is 5. GCS motor subscore is 6.     Cranial Nerves: Dysarthria and facial asymmetry present.     Motor: Weakness and abnormal muscle tone present.     Comments: Weakness and abnormal tone on left side   Psychiatric:        Attention and Perception: Attention normal.        Mood and Affect: Mood and affect normal.        Speech: Speech is  slurred.  Behavior: Behavior normal. Behavior is cooperative.      Recent Results (from the past 2160 hour(s))  Bladder Scan (Post Void Residual) in office     Status: None   Collection Time: 02/25/22  9:07 AM  Result Value Ref Range   Scan Result 225m   Protime-INR     Status: None   Collection Time: 04/21/22  9:30 AM  Result Value Ref Range   Prothrombin Time 13.6 11.4 - 15.2 seconds   INR 1.1 0.8 - 1.2    Comment: (NOTE) INR goal varies based on device and disease states. Performed at [facility] [address], [place] [address]  APTT     Status: Abnormal   Collection Time: 04/21/22  9:30 AM  Result Value Ref Range   aPTT 37 (H) 24 - 36 seconds    Comment:        IF BASELINE aPTT IS ELEVATED, SUGGEST PATIENT RISK ASSESSMENT BE USED TO DETERMINE APPROPRIATE ANTICOAGULANT THERAPY. Performed at [facility] [address], [place] [address]  CBC     Status: Abnormal   Collection Time: 04/21/22  9:30 AM  Result Value Ref Range   WBC 5.6 4.0 - 10.5 K/uL   RBC 4.00 (L) 4.22 - 5.81 MIL/uL   Hemoglobin 10.6 (L) 13.0 - 17.0 g/dL   HCT 33.5 (L) 39.0 - 52.0 %   MCV 83.8 80.0 - 100.0 fL   MCH 26.5 26.0 - 34.0 pg   MCHC 31.6 30.0 - 36.0 g/dL   RDW 19.0 (H) 11.5 - 15.5 %   Platelets 267 150 - 400 K/uL   nRBC 0.0 0.0 - 0.2 %    Comment: [facility]formed at [facility] [address], [place] [id number] Differential     Status: None   Collection Time: 04/21/22  9:30 AM  Result Value Ref Range   Neutrophils Relative % 54 %   Neutro Abs 3.0 1.7 - 7.7 K/uL   Lymphocytes Relative 35 %   Lymphs Abs 2.0 0.7 - 4.0 K/uL   Monocytes Relative 9 %   Monocytes Absolute 0.5 0.1 - 1.0 K/uL   Eosinophils Relative 1 %   Eosinophils Absolute 0.1 0.0 - 0.5 K/uL   Basophils Relative 1 %   Basophils Absolute 0.0 0.0 - 0.1 K/uL   Immature Granulocytes 0 %   Abs Immature Granulocytes 0.01 0.00 - 0.07 K/uL    Comment: Performed  at [facility] [address], [place] [address] Comprehensive metabolic panel     Status: Abnormal   Collection Time: 04/21/22  9:30 AM  Result Value Ref Range   Sodium 141 135 - 145 mmol/L   Potassium 3.8 3.5 - 5.1 mmol/L   Chloride 113 (H) 98 - 111 mmol/L   CO2 21 (L) 22 - 32 mmol/L   Glucose, Bld 114 (H) 70 - 99 mg/dL    Comment: Glucose reference range applies only to samples taken after fasting for at least 8 hours.   BUN 27 (H) 8 - 23 mg/dL   Creatinine, Ser 1.82 (H) 0.61 - 1.24 mg/dL   Calcium 9.3 8.9 - 10.3 mg/dL   Total Protein 8.1 6.5 - 8.1 g/dL   Albumin 3.7 3.5 - 5.0 g/dL   AST 29 15 - 41 U/L   ALT 47 (H) 0 - 44 U/L   Alkaline Phosphatase 164 (H) 38 - 126 U/L   Total Bilirubin 0.7 0.3 -  1.2 mg/dL   GFR, Estimated 41 (L) >60 mL/min    Comment: (NOTE) Calculated using the CKD-EPI Creatinine Equation (2021)    Anion gap 7 5 - 15    Comment: Performed at [facility], [address] [place], [place] [address]  SARS Coronavirus 2 by RT PCR (hospital order, performed in [facility] hospital lab) *cepheid single result test* Anterior Nasal Swab     Status: None   Collection Time: 04/21/22  9:30 AM   Specimen: Anterior Nasal Swab  Result Value Ref Range   SARS Coronavirus 2 by RT PCR NEGATIVE NEGATIVE    Comment: (NOTE) SARS-CoV-2 target nucleic acids are NOT DETECTED.  The SARS-CoV-2 RNA is generally detectable in upper and lower respiratory specimens during the acute phase of infection. The lowest concentration of SARS-CoV-2 viral copies this assay can detect is 250 copies / mL. A negative result does not preclude SARS-CoV-2 infection and should not be used as the sole basis for treatment or other patient management decisions.  A negative result may occur with improper specimen collection / handling, submission of specimen other than nasopharyngeal swab, presence of viral mutation(s) within the areas targeted by this assay, and  inadequate number of viral copies (<250 copies / mL). A negative result must be combined with clinical observations, patient history, and epidemiological information.  Fact Sheet for Patients:   https://www.patel.info/  Fact Sheet for Healthcare Providers: https://hall.com/  This test is not yet approved or  cleared by the [place] FDA and has been authorized for detection and/or diagnosis of SARS-CoV-2 by FDA under an Emergency Use Authorization (EUA).  This EUA will remain in effect (meaning this test can be used) for the duration of the COVID-19 declaration under Section 564(b)(1) of the Act, 21 U.S.C. section 360bbb-3(b)(1), unless the authorization is terminated or revoked sooner.  Performed at [facility], [place], [place] [address]   Troponin I (High Sensitivity)     Status: None   Collection Time: 04/21/22  9:30 AM  Result Value Ref Range   Troponin I (High Sensitivity) 10 <18 ng/L    Comment: (NOTE) Elevated high sensitivity troponin I (hsTnI) values and significant  changes across serial measurements may suggest ACS but many other  chronic and acute conditions are known to elevate hsTnI results.  Refer to the "Links" section for chest pain algorithms and additional  guidance. Performed at [facility], [address] [place], [place] [address]   Hemoglobin A1c     Status: Abnormal   Collection Time: 04/21/22  9:30 AM  Result Value Ref Range   Hgb A1c MFr Bld 7.0 (H) 4.8 - 5.6 %    Comment: (NOTE) Pre diabetes:          5.7%-6.4%  Diabetes:              >6.4%  Glycemic control for   <7.0% adults with diabetes    Mean Plasma Glucose 154.2 mg/dL    Comment: Performed at [facility] [address] [place], [place] [address]  Urinalysis, Routine w reflex microscopic     Status: Abnormal   Collection Time: 04/21/22  9:56 AM  Result Value Ref Range   Color, Urine YELLOW  (A) YELLOW   APPearance HAZY (A) CLEAR   Specific Gravity, Urine 1.014 1.005 - 1.030   pH 5.0 5.0 - 8.0   Glucose, UA >=500 (A) NEGATIVE mg/dL   Hgb urine dipstick SMALL (A) NEGATIVE  Bilirubin Urine NEGATIVE NEGATIVE   Ketones, ur NEGATIVE NEGATIVE mg/dL   Protein, ur >=300 (A) NEGATIVE mg/dL   Nitrite NEGATIVE NEGATIVE   Leukocytes,Ua NEGATIVE NEGATIVE   RBC / HPF 0-5 0 - 5 RBC/hpf   WBC, UA 0-5 0 - 5 WBC/hpf   Bacteria, UA RARE (A) NONE SEEN   Squamous Epithelial / LPF 0-5 0 - 5   Mucus PRESENT    Sperm, UA PRESENT     Comment: Performed at [facility], [place], [place] [address]  Troponin I (High Sensitivity)     Status: None   Collection Time: 04/21/22  1:43 PM  Result Value Ref Range   Troponin I (High Sensitivity) 9 <18 ng/L    Comment: (NOTE) Elevated high sensitivity troponin I (hsTnI) values and significant  changes across serial measurements may suggest ACS but many other  chronic and acute conditions are known to elevate hsTnI results.  Refer to the "Links" section for chest pain algorithms and additional  guidance. Performed at [facility], [address] [place], [place] [address]   Glucose, capillary     Status: None   Collection Time: 04/21/22  4:28 PM  Result Value Ref Range   Glucose-Capillary 94 70 - 99 mg/dL    Comment: Glucose reference range applies only to samples taken after fasting for at least 8 hours.  ECHOCARDIOGRAM COMPLETE     Status: None   Collection Time: 04/21/22  4:30 PM  Result Value Ref Range   Weight 2,296.31 oz   Height 65 in   BP 184/92 mmHg   Ao pk vel 1.05 m/s   AV Area VTI 1.98 cm2   AR max vel 1.94 cm2   AV Mean grad 3.0 mmHg   AV Peak grad 4.4 mmHg   S' Lateral 2.58 cm   AV Area mean vel 1.85 cm2   Area-P 1/2 3.31 cm2  Glucose, capillary     Status: None   Collection Time: 04/21/22  8:18 PM  Result Value Ref Range   Glucose-Capillary 83 70 - 99 mg/dL    Comment: Glucose reference  range applies only to samples taken after fasting for at least 8 hours.  Glucose, capillary     Status: Abnormal   Collection Time: 04/22/22  4:45 AM  Result Value Ref Range   Glucose-Capillary 62 (L) 70 - 99 mg/dL    Comment: Glucose reference range applies only to samples taken after fasting for at least 8 hours.  Lipid panel     Status: None   Collection Time: 04/22/22  5:19 AM  Result Value Ref Range   Cholesterol 128 0 - 200 mg/dL   Triglycerides 91 <150 mg/dL   HDL 47 >40 mg/dL   Total CHOL/HDL Ratio 2.7 RATIO   VLDL 18 0 - 40 mg/dL   LDL Cholesterol 63 0 - 99 mg/dL    Comment:        Total Cholesterol/HDL:CHD Risk Coronary Heart Disease Risk Table                     Men   Women  1/2 Average Risk   3.4   3.3  Average Risk       5.0   4.4  2 X Average Risk   9.6   7.1  3 X Average Risk  23.4   11.0        Use the calculated Patient Ratio above and the CHD Risk  Table to determine the patient's CHD Risk.        ATP III CLASSIFICATION (LDL):  <100     mg/dL   Optimal  100-129  mg/dL   Near or Above                    Optimal  130-159  mg/dL   Borderline  160-189  mg/dL   High  >190     mg/dL   Very High Performed at [facility], [address] [place], [place] [address]   Glucose, capillary     Status: Abnormal   Collection Time: 04/22/22  6:35 AM  Result Value Ref Range   Glucose-Capillary 107 (H) 70 - 99 mg/dL    Comment: Glucose reference range applies only to samples taken after fasting for at least 8 hours.  Glucose, capillary     Status: None   Collection Time: 04/22/22  8:37 AM  Result Value Ref Range   Glucose-Capillary 78 70 - 99 mg/dL    Comment: Glucose reference range applies only to samples taken after fasting for at least 8 hours.  Culture, blood (Routine X 2) w Reflex to ID Panel     Status: None   Collection Time: 04/22/22 11:37 AM   Specimen: BLOOD  Result Value Ref Range   Specimen Description BLOOD RIGHT ANTECUBITAL    Special  Requests      BOTTLES DRAWN AEROBIC AND ANAEROBIC Blood Culture adequate volume   Culture      NO GROWTH 5 DAYS Performed at [facility] [address] [place], [place] [address]    Report Status 04/27/2022 FINAL   Culture, blood (Routine X 2) w Reflex to ID Panel     Status: None   Collection Time: 04/22/22 11:43 AM   Specimen: BLOOD  Result Value Ref Range   Specimen Description BLOOD BLOOD RIGHT HAND    Special Requests      BOTTLES DRAWN AEROBIC AND ANAEROBIC Blood Culture results may not be optimal due to an excessive volume of blood received in culture bottles   Culture      NO GROWTH 5 DAYS Performed at [facility], [address], [place], [place] [address]    Report Status 04/27/2022 FINAL   Glucose, capillary     Status: None   Collection Time: 04/22/22  2:26 PM  Result Value Ref Range   Glucose-Capillary 85 70 - 99 mg/dL    Comment: Glucose reference range applies only to samples taken after fasting for at least 8 hours.  Glucose, capillary     Status: Abnormal   Collection Time: 04/22/22  4:03 PM  Result Value Ref Range   Glucose-Capillary 132 (H) 70 - 99 mg/dL    Comment: Glucose reference range applies only to samples taken after fasting for at least 8 hours.  Glucose, capillary     Status: None   Collection Time: 04/22/22  8:24 PM  Result Value Ref Range   Glucose-Capillary 91 70 - 99 mg/dL    Comment: Glucose reference range applies only to samples taken after fasting for at least 8 hours.  Basic metabolic panel     Status: Abnormal   Collection Time: 04/23/22  4:15 AM  Result Value Ref Range   Sodium 143 135 - 145 mmol/L   Potassium 3.6 3.5 - 5.1 mmol/L   Chloride 113 (H) 98 - 111 mmol/L   CO2 21 (L) 22 - 32 mmol/L   Glucose,  Bld 84 70 - 99 mg/dL    Comment: Glucose reference range applies only to samples taken after fasting for at least 8 hours.   BUN 22 8 - 23 mg/dL   Creatinine, Ser 1.58 (H) 0.61 - 1.24 mg/dL   Calcium 9.2 8.9 -  10.3 mg/dL   GFR, Estimated 48 (L) >60 mL/min    Comment: (NOTE) Calculated using the CKD-EPI Creatinine Equation (2021)    Anion gap 9 5 - 15    Comment: Performed at [facility], [address] [place], [place] [address]  CBC     Status: Abnormal   Collection Time: 04/23/22  4:15 AM  Result Value Ref Range   WBC 5.6 4.0 - 10.5 K/uL   RBC 3.80 (L) 4.22 - 5.81 MIL/uL   Hemoglobin 10.1 (L) 13.0 - 17.0 g/dL   HCT 32.0 (L) 39.0 - 52.0 %   MCV 84.2 80.0 - 100.0 fL   MCH 26.6 26.0 - 34.0 pg   MCHC 31.6 30.0 - 36.0 g/dL   RDW 18.7 (H) 11.5 - 15.5 %   Platelets 257 150 - 400 K/uL   nRBC 0.0 0.0 - 0.2 %    Comment: Performed at [facility], [address] [place], [place] [address]  Glucose, capillary     Status: None   Collection Time: 04/23/22  8:35 AM  Result Value Ref Range   Glucose-Capillary 74 70 - 99 mg/dL    Comment: Glucose reference range applies only to samples taken after fasting for at least 8 hours.  Glucose, capillary     Status: Abnormal   Collection Time: 04/23/22  1:23 PM  Result Value Ref Range   Glucose-Capillary 166 (H) 70 - 99 mg/dL    Comment: Glucose reference range applies only to samples taken after fasting for at least 8 hours.     PHQ2/9:    04/06/2022    4:04 PM 01/13/2022    3:50 PM 11/12/2021    3:41 PM 06/02/2021    2:06 PM 03/20/2021    3:52 PM  Depression screen PHQ 2/9  Decreased Interest 3 3 0 0 0  Down, Depressed, Hopeless 0 0 0 0   PHQ - 2 Score 3 3 0 0 0  Altered sleeping 0 3 0    Tired, decreased energy 3 3 0    Change in appetite 2 0 0    Feeling bad or failure about yourself  0 0 0    Trouble concentrating 0 0 0    Moving slowly or fidgety/restless 3 0 0    Suicidal thoughts 0 0 0    PHQ-9 Score 11 9 0    Difficult doing work/chores Somewhat difficult  Not difficult at all        Fall Risk:    04/06/2022    4:04 PM 01/13/2022    3:50 PM 11/12/2021    3:40 PM 06/20/2021   11:38 AM 06/02/2021    2:05 PM   Fall Risk   Falls in the past year? 0 0 1 0 0  Number falls in past yr: 0 0 0 0 0  Injury with Fall? 0 0 0 0 0  Risk for fall due to : History of fall(s);Impaired balance/gait;Impaired mobility Impaired balance/gait;Impaired mobility Impaired mobility;Impaired balance/gait Impaired mobility;Impaired balance/gait No Fall Risks  Follow up Falls evaluation completed Falls evaluation completed Falls evaluation completed  Falls evaluation completed      Functional Status Survey:  Assessment & Plan  Problem List Items Addressed This Visit       Cardiovascular and Mediastinum   Recurrent strokes ([place]) - Primary    Hx of strokes that have resulted in left-sided hemiparesis and weakness He was recently discharged from hospital following acute left pontine stroke  He has resumed Eliquis 5 mg PO BID per Neurology  Will make new referral to Neurology for monitoring and evaluation I have sent referrals to allow for ongoing PT, OT, speech therapy to assist with deficits Will collaborate with Neurology as indicated Follow up for continued collaboration and ongoing concerns.       Relevant Medications   Incontinence Supply Disposable (COMFORT PROTECT ADULT DIAPER/L) MISC   Other Relevant Orders   Ambulatory referral to Neurology   Ambulatory referral to Speech Therapy   Ambulatory referral to Occupational Therapy   Ambulatory referral to Physical Therapy   For home use only DME Overbed table   For home use only DME Hospital bed   For home use only DME Other see comment   For home use only DME Other see comment     Endocrine   Diabetes mellitus ([place])    Chronic, historic condition  Wife reports he needs evaluation for diabetic footwear Will refer to Podiatry to assist with evaluation and recommendations for DM footwear that is appropriate  Follow up as needed Would like to recheck A1c at next visit and discuss medications       Relevant Orders   Ambulatory referral to  Podiatry     Nervous and Auditory   Peripheral neuropathy    Chronic, historic concern Likely secondary to multiple strokes and DM  He was previously on gabapentin - appears to have been dc in 2022 for unknown reasons Reviewed GFR and should be able to tolerate if he wishes to resume Recommend discussing at follow up as well as DM management      Relevant Medications   mirtazapine (REMERON) 7.5 MG tablet     Other   Insomnia    Chronic, ongoing concern Reports insomnia from leg pain/ neuropathy Will send refill for Mirtazapine 7.5 mg PO QHS to assist with sleep Follow up as needed       Relevant Medications   mirtazapine (REMERON) 7.5 MG tablet   Other Visit Diagnoses     Bilateral lower extremity edema       Relevant Medications   Elastic Bandages & Supports (MEDICAL COMPRESSION STOCKINGS) MISC   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   I, Gillie Fleites E Ellan Tess, PA-C, have reviewed all documentation for this visit. The documentation on 05/08/22 for the exam, diagnosis, procedures, and orders are all accurate and complete.   Talitha Givens, MHS, PA-C [facility]

## 2022-05-11 ENCOUNTER — Telehealth: Payer: Self-pay | Admitting: Nurse Practitioner

## 2022-05-11 NOTE — Telephone Encounter (Signed)
From when he was admitted on 04/23/22. MD had a face to face s/p acute stroke. Ethelene Hal, DO.

## 2022-05-11 NOTE — Telephone Encounter (Signed)
Can you do a DME order for the requested items.

## 2022-05-11 NOTE — Telephone Encounter (Signed)
Luz Brazen from [facility], gave verbal OK from previous encounter. Sherlynn Stalls also mentioned patient is needing a rx for Raised toilet seat with handles and a tub bench. Please advise.

## 2022-05-11 NOTE — Telephone Encounter (Signed)
Okay to give verbal orders?  ?

## 2022-05-11 NOTE — Telephone Encounter (Signed)
Pt spouse brought in [employer] Alternative program CAP/DA to be completed by provider.It was placed in the provider folder for completion.

## 2022-05-11 NOTE — Telephone Encounter (Signed)
Patient was seen 9/21 and has a follow up appointment scheduled with you.

## 2022-05-11 NOTE — Telephone Encounter (Signed)
See other encounter.

## 2022-05-12 DIAGNOSIS — Z9181 History of falling: Secondary | ICD-10-CM | POA: Diagnosis not present

## 2022-05-12 DIAGNOSIS — I69322 Dysarthria following cerebral infarction: Secondary | ICD-10-CM | POA: Diagnosis not present

## 2022-05-12 DIAGNOSIS — I69354 Hemiplegia and hemiparesis following cerebral infarction affecting left non-dominant side: Secondary | ICD-10-CM | POA: Diagnosis not present

## 2022-05-12 DIAGNOSIS — E7800 Pure hypercholesterolemia, unspecified: Secondary | ICD-10-CM | POA: Diagnosis not present

## 2022-05-12 DIAGNOSIS — I480 Paroxysmal atrial fibrillation: Secondary | ICD-10-CM | POA: Diagnosis not present

## 2022-05-12 DIAGNOSIS — Z7984 Long term (current) use of oral hypoglycemic drugs: Secondary | ICD-10-CM | POA: Diagnosis not present

## 2022-05-12 DIAGNOSIS — Z7901 Long term (current) use of anticoagulants: Secondary | ICD-10-CM | POA: Diagnosis not present

## 2022-05-12 DIAGNOSIS — I6523 Occlusion and stenosis of bilateral carotid arteries: Secondary | ICD-10-CM | POA: Diagnosis not present

## 2022-05-12 DIAGNOSIS — I69351 Hemiplegia and hemiparesis following cerebral infarction affecting right dominant side: Secondary | ICD-10-CM | POA: Diagnosis not present

## 2022-05-12 DIAGNOSIS — I110 Hypertensive heart disease with heart failure: Secondary | ICD-10-CM | POA: Diagnosis not present

## 2022-05-12 DIAGNOSIS — E1365 Other specified diabetes mellitus with hyperglycemia: Secondary | ICD-10-CM | POA: Diagnosis not present

## 2022-05-12 DIAGNOSIS — E1322 Other specified diabetes mellitus with diabetic chronic kidney disease: Secondary | ICD-10-CM | POA: Diagnosis not present

## 2022-05-12 DIAGNOSIS — I639 Cerebral infarction, unspecified: Secondary | ICD-10-CM | POA: Diagnosis not present

## 2022-05-12 DIAGNOSIS — N1831 Chronic kidney disease, stage 3a: Secondary | ICD-10-CM | POA: Diagnosis not present

## 2022-05-12 DIAGNOSIS — Z87891 Personal history of nicotine dependence: Secondary | ICD-10-CM | POA: Diagnosis not present

## 2022-05-12 DIAGNOSIS — I5030 Unspecified diastolic (congestive) heart failure: Secondary | ICD-10-CM | POA: Diagnosis not present

## 2022-05-13 DIAGNOSIS — I69354 Hemiplegia and hemiparesis following cerebral infarction affecting left non-dominant side: Secondary | ICD-10-CM | POA: Diagnosis not present

## 2022-05-13 DIAGNOSIS — Z87891 Personal history of nicotine dependence: Secondary | ICD-10-CM | POA: Diagnosis not present

## 2022-05-13 DIAGNOSIS — E1365 Other specified diabetes mellitus with hyperglycemia: Secondary | ICD-10-CM | POA: Diagnosis not present

## 2022-05-13 DIAGNOSIS — Z9181 History of falling: Secondary | ICD-10-CM | POA: Diagnosis not present

## 2022-05-13 DIAGNOSIS — I5030 Unspecified diastolic (congestive) heart failure: Secondary | ICD-10-CM | POA: Diagnosis not present

## 2022-05-13 DIAGNOSIS — I110 Hypertensive heart disease with heart failure: Secondary | ICD-10-CM | POA: Diagnosis not present

## 2022-05-13 DIAGNOSIS — I639 Cerebral infarction, unspecified: Secondary | ICD-10-CM | POA: Diagnosis not present

## 2022-05-13 DIAGNOSIS — Z7984 Long term (current) use of oral hypoglycemic drugs: Secondary | ICD-10-CM | POA: Diagnosis not present

## 2022-05-13 DIAGNOSIS — N1831 Chronic kidney disease, stage 3a: Secondary | ICD-10-CM | POA: Diagnosis not present

## 2022-05-13 DIAGNOSIS — Z7901 Long term (current) use of anticoagulants: Secondary | ICD-10-CM | POA: Diagnosis not present

## 2022-05-13 DIAGNOSIS — I480 Paroxysmal atrial fibrillation: Secondary | ICD-10-CM | POA: Diagnosis not present

## 2022-05-13 DIAGNOSIS — E1322 Other specified diabetes mellitus with diabetic chronic kidney disease: Secondary | ICD-10-CM | POA: Diagnosis not present

## 2022-05-13 DIAGNOSIS — E7800 Pure hypercholesterolemia, unspecified: Secondary | ICD-10-CM | POA: Diagnosis not present

## 2022-05-13 DIAGNOSIS — I69322 Dysarthria following cerebral infarction: Secondary | ICD-10-CM | POA: Diagnosis not present

## 2022-05-13 DIAGNOSIS — I69351 Hemiplegia and hemiparesis following cerebral infarction affecting right dominant side: Secondary | ICD-10-CM | POA: Diagnosis not present

## 2022-05-13 DIAGNOSIS — I6523 Occlusion and stenosis of bilateral carotid arteries: Secondary | ICD-10-CM | POA: Diagnosis not present

## 2022-05-13 NOTE — Telephone Encounter (Signed)
Signed.

## 2022-05-13 NOTE — Telephone Encounter (Signed)
Faxed DME orders to [facility].

## 2022-05-13 NOTE — Telephone Encounter (Signed)
Ready to be signed.  

## 2022-05-15 ENCOUNTER — Other Ambulatory Visit: Payer: Self-pay

## 2022-05-15 DIAGNOSIS — E1365 Other specified diabetes mellitus with hyperglycemia: Secondary | ICD-10-CM | POA: Diagnosis not present

## 2022-05-15 DIAGNOSIS — I110 Hypertensive heart disease with heart failure: Secondary | ICD-10-CM | POA: Diagnosis not present

## 2022-05-15 DIAGNOSIS — Z87891 Personal history of nicotine dependence: Secondary | ICD-10-CM | POA: Diagnosis not present

## 2022-05-15 DIAGNOSIS — E7800 Pure hypercholesterolemia, unspecified: Secondary | ICD-10-CM | POA: Diagnosis not present

## 2022-05-15 DIAGNOSIS — I69351 Hemiplegia and hemiparesis following cerebral infarction affecting right dominant side: Secondary | ICD-10-CM | POA: Diagnosis not present

## 2022-05-15 DIAGNOSIS — I69322 Dysarthria following cerebral infarction: Secondary | ICD-10-CM | POA: Diagnosis not present

## 2022-05-15 DIAGNOSIS — Z9181 History of falling: Secondary | ICD-10-CM | POA: Diagnosis not present

## 2022-05-15 DIAGNOSIS — I69354 Hemiplegia and hemiparesis following cerebral infarction affecting left non-dominant side: Secondary | ICD-10-CM | POA: Diagnosis not present

## 2022-05-15 DIAGNOSIS — Z7984 Long term (current) use of oral hypoglycemic drugs: Secondary | ICD-10-CM | POA: Diagnosis not present

## 2022-05-15 DIAGNOSIS — I5030 Unspecified diastolic (congestive) heart failure: Secondary | ICD-10-CM | POA: Diagnosis not present

## 2022-05-15 DIAGNOSIS — Z7901 Long term (current) use of anticoagulants: Secondary | ICD-10-CM | POA: Diagnosis not present

## 2022-05-15 DIAGNOSIS — R531 Weakness: Secondary | ICD-10-CM | POA: Diagnosis not present

## 2022-05-15 DIAGNOSIS — I480 Paroxysmal atrial fibrillation: Secondary | ICD-10-CM | POA: Diagnosis not present

## 2022-05-15 DIAGNOSIS — E1322 Other specified diabetes mellitus with diabetic chronic kidney disease: Secondary | ICD-10-CM | POA: Diagnosis not present

## 2022-05-15 DIAGNOSIS — N1831 Chronic kidney disease, stage 3a: Secondary | ICD-10-CM | POA: Diagnosis not present

## 2022-05-15 DIAGNOSIS — I639 Cerebral infarction, unspecified: Secondary | ICD-10-CM | POA: Diagnosis not present

## 2022-05-15 DIAGNOSIS — I6523 Occlusion and stenosis of bilateral carotid arteries: Secondary | ICD-10-CM | POA: Diagnosis not present

## 2022-05-15 DIAGNOSIS — R062 Wheezing: Secondary | ICD-10-CM

## 2022-05-15 NOTE — Telephone Encounter (Signed)
Medication refill for Albuterol  last ov 05/07/22, upcoming ov 06/09/22 . Please advise

## 2022-05-16 ENCOUNTER — Other Ambulatory Visit (INDEPENDENT_AMBULATORY_CARE_PROVIDER_SITE_OTHER): Payer: Medicare Other | Admitting: Family Medicine

## 2022-05-16 DIAGNOSIS — I480 Paroxysmal atrial fibrillation: Secondary | ICD-10-CM

## 2022-05-16 DIAGNOSIS — N183 Chronic kidney disease, stage 3 unspecified: Secondary | ICD-10-CM

## 2022-05-16 DIAGNOSIS — I69354 Hemiplegia and hemiparesis following cerebral infarction affecting left non-dominant side: Secondary | ICD-10-CM | POA: Diagnosis not present

## 2022-05-16 DIAGNOSIS — I639 Cerebral infarction, unspecified: Secondary | ICD-10-CM

## 2022-05-16 DIAGNOSIS — L97521 Non-pressure chronic ulcer of other part of left foot limited to breakdown of skin: Secondary | ICD-10-CM

## 2022-05-16 DIAGNOSIS — Z794 Long term (current) use of insulin: Secondary | ICD-10-CM

## 2022-05-16 DIAGNOSIS — E785 Hyperlipidemia, unspecified: Secondary | ICD-10-CM

## 2022-05-16 DIAGNOSIS — I482 Chronic atrial fibrillation, unspecified: Secondary | ICD-10-CM

## 2022-05-16 DIAGNOSIS — Z8673 Personal history of transient ischemic attack (TIA), and cerebral infarction without residual deficits: Secondary | ICD-10-CM

## 2022-05-16 DIAGNOSIS — I161 Hypertensive emergency: Secondary | ICD-10-CM

## 2022-05-16 DIAGNOSIS — E1122 Type 2 diabetes mellitus with diabetic chronic kidney disease: Secondary | ICD-10-CM

## 2022-05-16 DIAGNOSIS — R4781 Slurred speech: Secondary | ICD-10-CM

## 2022-05-16 DIAGNOSIS — I69391 Dysphagia following cerebral infarction: Secondary | ICD-10-CM

## 2022-05-16 DIAGNOSIS — E1165 Type 2 diabetes mellitus with hyperglycemia: Secondary | ICD-10-CM

## 2022-05-16 DIAGNOSIS — F321 Major depressive disorder, single episode, moderate: Secondary | ICD-10-CM

## 2022-05-16 DIAGNOSIS — E11621 Type 2 diabetes mellitus with foot ulcer: Secondary | ICD-10-CM

## 2022-05-16 NOTE — Progress Notes (Signed)
Received home health orders orders from [facility]. Start of care 04/25/22.   Certification and orders from 04/25/22 through 06/23/22 are reviewed, signed and faxed back to home health company.  Need of intermittent skilled services at home: due to recent stroke  The home health care plan has been established by me and will be reviewed and updated as needed to maximize patient recovery.  I certify that all home health services have been and will be furnished to the patient while under my care or the care of the APP under my supervision.  Face-to-face encounter in which the need for home health services was established: 04/23/22 at hospital discharge  Patient is receiving home health services for the following diagnoses: Problem List Items Addressed This Visit       Cardiovascular and Mediastinum   Recurrent strokes ([place])   Atrial fibrillation, chronic ([place])   Hypertensive emergency without congestive heart failure   Paroxysmal atrial fibrillation ([facility])     Digestive   Dysphagia, post-stroke     Endocrine   Uncontrolled type 2 diabetes mellitus with hyperglycemia, with long-term current use of insulin ([place])   Type 2 diabetes mellitus with hyperglycemia ([place])   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

## 2022-05-18 MED ORDER — ALBUTEROL SULFATE HFA 108 (90 BASE) MCG/ACT IN AERS: 2.0000 | INHALATION_SPRAY | Freq: Four times a day (QID) | RESPIRATORY_TRACT | 1 refills | Status: DC | PRN

## 2022-05-19 ENCOUNTER — Ambulatory Visit: Payer: Medicare Other | Admitting: Occupational Therapy

## 2022-05-19 DIAGNOSIS — I480 Paroxysmal atrial fibrillation: Secondary | ICD-10-CM | POA: Diagnosis not present

## 2022-05-19 DIAGNOSIS — Z9181 History of falling: Secondary | ICD-10-CM | POA: Diagnosis not present

## 2022-05-19 DIAGNOSIS — I69322 Dysarthria following cerebral infarction: Secondary | ICD-10-CM | POA: Diagnosis not present

## 2022-05-19 DIAGNOSIS — I6523 Occlusion and stenosis of bilateral carotid arteries: Secondary | ICD-10-CM | POA: Diagnosis not present

## 2022-05-19 DIAGNOSIS — I69354 Hemiplegia and hemiparesis following cerebral infarction affecting left non-dominant side: Secondary | ICD-10-CM | POA: Diagnosis not present

## 2022-05-19 DIAGNOSIS — E1322 Other specified diabetes mellitus with diabetic chronic kidney disease: Secondary | ICD-10-CM | POA: Diagnosis not present

## 2022-05-19 DIAGNOSIS — Z7901 Long term (current) use of anticoagulants: Secondary | ICD-10-CM | POA: Diagnosis not present

## 2022-05-19 DIAGNOSIS — E1365 Other specified diabetes mellitus with hyperglycemia: Secondary | ICD-10-CM | POA: Diagnosis not present

## 2022-05-19 DIAGNOSIS — I110 Hypertensive heart disease with heart failure: Secondary | ICD-10-CM | POA: Diagnosis not present

## 2022-05-19 DIAGNOSIS — I5030 Unspecified diastolic (congestive) heart failure: Secondary | ICD-10-CM | POA: Diagnosis not present

## 2022-05-19 DIAGNOSIS — Z7984 Long term (current) use of oral hypoglycemic drugs: Secondary | ICD-10-CM | POA: Diagnosis not present

## 2022-05-19 DIAGNOSIS — N1831 Chronic kidney disease, stage 3a: Secondary | ICD-10-CM | POA: Diagnosis not present

## 2022-05-19 DIAGNOSIS — E7800 Pure hypercholesterolemia, unspecified: Secondary | ICD-10-CM | POA: Diagnosis not present

## 2022-05-19 DIAGNOSIS — Z87891 Personal history of nicotine dependence: Secondary | ICD-10-CM | POA: Diagnosis not present

## 2022-05-19 DIAGNOSIS — I69351 Hemiplegia and hemiparesis following cerebral infarction affecting right dominant side: Secondary | ICD-10-CM | POA: Diagnosis not present

## 2022-05-19 DIAGNOSIS — I639 Cerebral infarction, unspecified: Secondary | ICD-10-CM | POA: Diagnosis not present

## 2022-05-20 ENCOUNTER — Ambulatory Visit: Payer: Medicare Other | Admitting: Occupational Therapy

## 2022-05-20 DIAGNOSIS — Z7901 Long term (current) use of anticoagulants: Secondary | ICD-10-CM | POA: Diagnosis not present

## 2022-05-20 DIAGNOSIS — E7800 Pure hypercholesterolemia, unspecified: Secondary | ICD-10-CM | POA: Diagnosis not present

## 2022-05-20 DIAGNOSIS — E1365 Other specified diabetes mellitus with hyperglycemia: Secondary | ICD-10-CM | POA: Diagnosis not present

## 2022-05-20 DIAGNOSIS — I480 Paroxysmal atrial fibrillation: Secondary | ICD-10-CM | POA: Diagnosis not present

## 2022-05-20 DIAGNOSIS — I110 Hypertensive heart disease with heart failure: Secondary | ICD-10-CM | POA: Diagnosis not present

## 2022-05-20 DIAGNOSIS — I69322 Dysarthria following cerebral infarction: Secondary | ICD-10-CM | POA: Diagnosis not present

## 2022-05-20 DIAGNOSIS — I639 Cerebral infarction, unspecified: Secondary | ICD-10-CM | POA: Diagnosis not present

## 2022-05-20 DIAGNOSIS — E1322 Other specified diabetes mellitus with diabetic chronic kidney disease: Secondary | ICD-10-CM | POA: Diagnosis not present

## 2022-05-20 DIAGNOSIS — Z87891 Personal history of nicotine dependence: Secondary | ICD-10-CM | POA: Diagnosis not present

## 2022-05-20 DIAGNOSIS — I6523 Occlusion and stenosis of bilateral carotid arteries: Secondary | ICD-10-CM | POA: Diagnosis not present

## 2022-05-20 DIAGNOSIS — Z7984 Long term (current) use of oral hypoglycemic drugs: Secondary | ICD-10-CM | POA: Diagnosis not present

## 2022-05-20 DIAGNOSIS — I5030 Unspecified diastolic (congestive) heart failure: Secondary | ICD-10-CM | POA: Diagnosis not present

## 2022-05-20 DIAGNOSIS — Z9181 History of falling: Secondary | ICD-10-CM | POA: Diagnosis not present

## 2022-05-20 DIAGNOSIS — N1831 Chronic kidney disease, stage 3a: Secondary | ICD-10-CM | POA: Diagnosis not present

## 2022-05-20 DIAGNOSIS — I69351 Hemiplegia and hemiparesis following cerebral infarction affecting right dominant side: Secondary | ICD-10-CM | POA: Diagnosis not present

## 2022-05-20 DIAGNOSIS — I69354 Hemiplegia and hemiparesis following cerebral infarction affecting left non-dominant side: Secondary | ICD-10-CM | POA: Diagnosis not present

## 2022-05-21 DIAGNOSIS — I639 Cerebral infarction, unspecified: Secondary | ICD-10-CM | POA: Diagnosis not present

## 2022-05-21 DIAGNOSIS — R531 Weakness: Secondary | ICD-10-CM | POA: Diagnosis not present

## 2022-05-22 ENCOUNTER — Ambulatory Visit: Payer: Medicare Other | Attending: Medical | Admitting: Medical

## 2022-05-22 ENCOUNTER — Encounter: Payer: Self-pay | Admitting: Medical

## 2022-05-22 VITALS — BP 120/70 | HR 51 | Ht 66.0 in | Wt 158.2 lb

## 2022-05-22 DIAGNOSIS — I69351 Hemiplegia and hemiparesis following cerebral infarction affecting right dominant side: Secondary | ICD-10-CM | POA: Diagnosis not present

## 2022-05-22 DIAGNOSIS — E1159 Type 2 diabetes mellitus with other circulatory complications: Secondary | ICD-10-CM | POA: Diagnosis not present

## 2022-05-22 DIAGNOSIS — R001 Bradycardia, unspecified: Secondary | ICD-10-CM | POA: Diagnosis not present

## 2022-05-22 DIAGNOSIS — I480 Paroxysmal atrial fibrillation: Secondary | ICD-10-CM | POA: Diagnosis not present

## 2022-05-22 DIAGNOSIS — Z9181 History of falling: Secondary | ICD-10-CM | POA: Diagnosis not present

## 2022-05-22 DIAGNOSIS — E1322 Other specified diabetes mellitus with diabetic chronic kidney disease: Secondary | ICD-10-CM | POA: Diagnosis not present

## 2022-05-22 DIAGNOSIS — I639 Cerebral infarction, unspecified: Secondary | ICD-10-CM | POA: Diagnosis not present

## 2022-05-22 DIAGNOSIS — N1831 Chronic kidney disease, stage 3a: Secondary | ICD-10-CM | POA: Diagnosis not present

## 2022-05-22 DIAGNOSIS — Z7901 Long term (current) use of anticoagulants: Secondary | ICD-10-CM | POA: Diagnosis not present

## 2022-05-22 DIAGNOSIS — I5189 Other ill-defined heart diseases: Secondary | ICD-10-CM

## 2022-05-22 DIAGNOSIS — I6523 Occlusion and stenosis of bilateral carotid arteries: Secondary | ICD-10-CM | POA: Diagnosis not present

## 2022-05-22 DIAGNOSIS — I5030 Unspecified diastolic (congestive) heart failure: Secondary | ICD-10-CM | POA: Diagnosis not present

## 2022-05-22 DIAGNOSIS — I441 Atrioventricular block, second degree: Secondary | ICD-10-CM

## 2022-05-22 DIAGNOSIS — I69322 Dysarthria following cerebral infarction: Secondary | ICD-10-CM | POA: Diagnosis not present

## 2022-05-22 DIAGNOSIS — Z7984 Long term (current) use of oral hypoglycemic drugs: Secondary | ICD-10-CM | POA: Diagnosis not present

## 2022-05-22 DIAGNOSIS — Z87891 Personal history of nicotine dependence: Secondary | ICD-10-CM | POA: Diagnosis not present

## 2022-05-22 DIAGNOSIS — Z794 Long term (current) use of insulin: Secondary | ICD-10-CM

## 2022-05-22 DIAGNOSIS — E1365 Other specified diabetes mellitus with hyperglycemia: Secondary | ICD-10-CM | POA: Diagnosis not present

## 2022-05-22 DIAGNOSIS — E7800 Pure hypercholesterolemia, unspecified: Secondary | ICD-10-CM | POA: Diagnosis not present

## 2022-05-22 DIAGNOSIS — I69354 Hemiplegia and hemiparesis following cerebral infarction affecting left non-dominant side: Secondary | ICD-10-CM | POA: Diagnosis not present

## 2022-05-22 DIAGNOSIS — I110 Hypertensive heart disease with heart failure: Secondary | ICD-10-CM | POA: Diagnosis not present

## 2022-05-22 MED ORDER — DAPAGLIFLOZIN PROPANEDIOL 10 MG PO TABS: 10.0000 mg | ORAL_TABLET | Freq: Every day | ORAL | 6 refills | Status: DC

## 2022-05-22 NOTE — Patient Instructions (Addendum)
Medication Instructions:  - Your physician has recommended you make the following change in your medication:   1) RESTART Farxiga 10 mg: - take 1 tablet by mouth once daily   *If you need a refill on your cardiac medications before your next appointment, please call your pharmacy*   Lab Work: - Your physician recommends that you return for lab work in: 1 week (around 05/29/22)  BMP  [facility] Entrance at [facility] 1st desk on the right to check in (REGISTRATION)  Lab hours: Monday- Friday (7:30 am- 5:30 pm)   If you have labs (blood work) drawn today and your tests are completely normal, you will receive your results only by: MyChart Message (if you have MyChart) OR A paper copy in the mail If you have any lab test that is abnormal or we need to change your treatment, we will call you to review the results.   Testing/Procedures: - none ordered   Follow-Up: At [facility], you and your health needs are our priority.  As part of our continuing mission to provide you with exceptional heart care, we have created designated Provider Care Teams.  These Care Teams include your primary Cardiologist (physician) and Advanced Practice Providers (APPs -  Physician Assistants and Nurse Practitioners) who all work together to provide you with the care you need, when you need it.  We recommend signing up for the patient portal called "MyChart".  Sign up information is provided on this After Visit Summary.  MyChart is used to connect with patients for Virtual Visits (Telemedicine).  Patients are able to view lab/test results, encounter notes, upcoming appointments, etc.  Non-urgent messages can be sent to your provider as well.   To learn more about what you can do with MyChart, go to [email or web address].    Your next appointment:   3 month(s)  The format for your next appointment:   In Person  Provider:   You may see [name], MD or one of the following Advanced Practice  Providers on your designated Care Team:    Cadence Fransico Michael, [place]     Other Instructions  Dapagliflozin Tablets What is this medication? DAPAGLIFLOZIN (DAP a gli FLOE zin) treats type 2 diabetes. It works by helping your kidneys remove sugar (glucose) from your blood through the urine, which decreases your blood sugar. It can also be used to lower the risk of heart attack, stroke, kidney disease, and hospitalization for heart failure in people with type 2 diabetes. Changes to diet and exercise are often combined with this medication. This medicine may be used for other purposes; ask your health care provider or pharmacist if you have questions. COMMON BRAND NAME(S): Marcelline Deist What should I tell my care team before I take this medication? They need to know if you have any of these conditions: Dehydration Diabetic ketoacidosis Diet low in salt Eating less due to illness, surgery, dieting, or any other reason Frequently drink alcohol Having surgery History of pancreatitis or pancreas problems History of yeast infection of the penis or vagina Infection in the bladder, kidneys, or urinary tract Kidney disease Low blood pressure On dialysis Problems urinating Type 1 diabetes Uncircumcised male An unusual or allergic reaction to dapagliflozin, other medications, foods, dyes, or preservatives Pregnant or trying to get pregnant Breast-feeding How should I use this medication? Take this medication by mouth with water. Take it as directed on the prescription label at the same time every day. You can take it with or without food. If  it upsets your stomach, take it with food. Keep taking it unless your care team tells you to stop. A special MedGuide will be given to you by the pharmacist with each prescription and refill. Be sure to read this information carefully each time. Talk to your care team about the use of this medication in children. Special care may be needed. Overdosage: If you think you  have taken too much of this medicine contact a poison control center or emergency room at once. NOTE: This medicine is only for you. Do not share this medicine with others. What if I miss a dose? If you miss a dose, take it as soon as you can. If it is almost time for your next dose, take only that dose. Do not take double or extra doses. What may interact with this medication? Lithium Sulfonylureas, such as glimepiride, glipizide, glyburide This list may not describe all possible interactions. Give your health care provider a list of all the medicines, herbs, non-prescription drugs, or dietary supplements you use. Also tell them if you smoke, drink alcohol, or use illegal drugs. Some items may interact with your medicine. What should I watch for while using this medication? Visit your care team for regular checks on your progress. Tell your care team if your symptoms do not start to get better or if they get worse. This medication can cause a serious condition in which there is too much acid in the blood. If you develop nausea, vomiting, stomach pain, unusual tiredness, or breathing problems, stop taking this medication and call your care team right away. If possible, use a ketone dipstick to check for ketones in your urine. Check with your care team if you have severe diarrhea, nausea, and vomiting, or if you sweat a lot. The loss of too much body fluid may make it dangerous for you to take this medication. A test called the HbA1C (A1C) will be monitored. This is a simple blood test. It measures your blood sugar control over the last 2 to 3 months. You will receive this test every 3 to 6 months. Learn how to check your blood sugar. Learn the symptoms of low and high blood sugar and how to manage them. Always carry a quick-source of sugar with you in case you have symptoms of low blood sugar. Examples include hard sugar candy or glucose tablets. Make sure others know that you can choke if you eat or  drink when you develop serious symptoms of low blood sugar, such as seizures or unconsciousness. Get medical help at once. Tell your care team if you have high blood sugar. You might need to change the dose of your medication. If you are sick or exercising more than usual, you may need to change the dose of your medication. Do not skip meals. Ask your care team if you should avoid alcohol. Many nonprescription cough and cold products contain sugar or alcohol. These can affect blood sugar. Wear a medical ID bracelet or chain. Carry a card that describes your condition. List the medications and doses you take on the card. What side effects may I notice from receiving this medication? Side effects that you should report to your care team as soon as possible: Allergic reactions--skin rash, itching, hives, swelling of the face, lips, tongue, or throat Dehydration--increased thirst, dry mouth, feeling faint or lightheaded, headache, dark yellow or brown urine Diabetic ketoacidosis (DKA)--increased thirst or amount of urine, dry mouth, fatigue, fruity odor to breath, trouble breathing, stomach pain,  nausea, vomiting Genital yeast infection--redness, swelling, pain, or itchiness, odor, thick or lumpy discharge New pain or tenderness, change in skin color, sores or ulcers, infection of the leg or foot Infection or redness, swelling, tenderness, or pain in the genitals, or area from the genitals to the back of the rectum Urinary tract infection (UTI)--burning when passing urine, passing frequent small amounts of urine, bloody or cloudy urine, pain in the lower back or sides This list may not describe all possible side effects. Call your doctor for medical advice about side effects. You may report side effects to FDA at 1-800-FDA-1088. Where should I keep my medication? Keep out of the reach of children and pets. Store at room temperature between 20 and 25 degrees C (68 and 77 degrees F). Get rid of any unused  medication after the expiration date. To get rid of medications that are no longer needed or have expired: Take the medication to a medication take-back program. Check with your pharmacy or law enforcement to find a location. If you cannot return the medication, check the label or package insert to see if the medication should be thrown out in the garbage or flushed down the toilet. If you are not sure, ask your care team. If it is safe to put it in the trash, take the medication out of the container. Mix the medication with cat litter, dirt, coffee grounds, or other unwanted substance. Seal the mixture in a bag or container. Put it in the trash. NOTE: This sheet is a summary. It may not cover all possible information. If you have questions about this medicine, talk to your doctor, pharmacist, or health care provider.  2023 Elsevier/Gold Standard (2020-10-16 00:00:00)   Important Information About Sugar

## 2022-05-22 NOTE — Progress Notes (Signed)
Cardiology Office Note:    Date:  06/11/2022   ID:  Evan Cones Sr., DOB 08/04/1956, MRN [id number]  PCP:  Jon Billings, NP  [facility] Cardiologist:  Nelva Bush, MD  [facility] Electrophysiologist:  Vickie Epley, MD   Referring MD: Charlynne Cousins, MD   Chief Complaint: 3 month follow-up  History of Present Illness:    Evan MATHERNE Sr. is a 66 y.o. male with a hx of strokes, paroxysmal afib, HTN, HLD, poorly controlled diabetes, cerebrovascular and carotid disease, and tobacco who presents for hospital follow-up.   Evan Townsend was admitted to [facility] in January 2021 with chest pain and hyperglycemia. Echo showed normal LVSF. Stress testing was nonischemic. The day after discharge, he presented to the [facility] ED secondary to dysarthria, left facial droop, and worsening left-sided weakness. MRI showed acute stroke in the left paramedian pons and right cerebellar hemisphere. He subsequently underwent outpatient monitoring which showed paroxysmal afib and he was placed on Eliquis therapy.     He was admitted in March 2023 for bradycardia with heart rates in the 30s on metoprolol. BB was held on admission. He was treated with glucagon, calcium and dopamine. Heart rates improved off FF. Heart monitor at discharged showed predominately NSR with Mobitz type 1 second degree AV block.    Seen 11/20/21 and was doing well. EKG showed NSR with HR 62bpm.    He was Admitted end of May 2023 with chest pain and bradycardia. He was seen by EP, noted to have significant conduction disease with RBBB, LAFB, and 1st degree AV block. He recommended LHC and cMRI. LHC showed mild to moderate nonobstructive CAD with normal LVEDP. cMRI showed normal LV thickness, LVEF 51%, no infiltrative disease.    He saw EP as outpatient 6/14 and a 2 week heart monitor was recommended.    Last seen 02/19/22 and brought in the heart monitor. Heart monitor showed NSR with average heart rate of 62bpm, 2 NSVT,  longest 9 beats, rare SVT.   Admitted September 2023 for weakness found to have acute CVA by MRI. TEE was negative. Carotid dopplers showed moderate rICA plaque progressed since 09/2018 but not hemodynamically significant. He was sent home on Lipitor and Eliquis.   Today, the patient reports he has residual right sided weakness. He is doing therapy three times weekly. He denies chest pain or SOB. NO lower leg edema. Wilder Glade was stopped, they are unsure why. Restart Farxgia. He has appointment with neurology in December.  Past Medical History:  Diagnosis Date   Acute CVA (cerebrovascular accident) ([place]) 06/05/2021   Acute ischemic stroke ([place]) 04/22/2022   CAP (community acquired pneumonia) 11/26/2021   Carotid arterial disease ([place])    a. 08/2018 Carotid U/S: min-mod RICA atherosclerosis w/o hemodynamically significant stenosis. Nl LICA.   Diabetes 1.5, managed as type 2 ([place])    Diastolic dysfunction    a. 08/2018 Echo: EF 65%. No rwma. Gr1 DD. Mild MR.   Diastolic dysfunction    a. 08/2019 Echo: EF 55-60%, Gr1 DD. No rwma. Mild MR. RVSP 37.320mHg.   Hypercholesterolemia    Hypertension    PAF (paroxysmal atrial fibrillation) ([place]    a. 10/2019 Event Monitor: PAF; b. CHA2DS2VASc = 5-->Eliquis.   Poorly controlled diabetes mellitus ([place]    a. 04/2019 A1c 13.8.   Recurrent strokes ([place]    a. 10/2016 MRI/A: Acute 569mR thalamic infarct, ? subacute infarct of R corona radiata; b. 08/2017 MRI/A: Acute 20m35materal L thalamic infarct.  Other more remote lacunar infarcts of thalami bilat. Small vessel dzs; c. 08/2018 MRI/A: Acute lacunar infarct of the post limb of R internal capsule; d. 08/2019 MRI Acute CVA of L paramedian pons adn R cerebellar hemisphere.   Sepsis due to pneumonia ([place]) 11/26/2021   Tobacco abuse     Past Surgical History:  Procedure Laterality Date   ESOPHAGOGASTRODUODENOSCOPY (EGD) WITH PROPOFOL N/A 04/26/2019   Procedure: ESOPHAGOGASTRODUODENOSCOPY (EGD) WITH PROPOFOL;  Surgeon:  Lin Landsman, MD;  Location: [facility] ENDOSCOPY;  Service: Gastroenterology;  Laterality: N/A;   LEFT HEART CATH AND CORONARY ANGIOGRAPHY N/A 01/09/2022   Procedure: LEFT HEART CATH AND CORONARY ANGIOGRAPHY;  Surgeon: Nelva Bush, MD;  Location: [place] [facility];  Service: Cardiovascular;  Laterality: N/A;   NO PAST SURGERIES     TEE WITHOUT CARDIOVERSION N/A 04/22/2022   Procedure: TRANSESOPHAGEAL ECHOCARDIOGRAM (TEE);  Surgeon: Kate Sable, MD;  Location: [facility] ORS;  Service: Cardiovascular;  Laterality: N/A;    Current Medications: Current Meds  Medication Sig   ACCU-CHEK GUIDE test strip Check fsbs once daily   Accu-Chek Softclix Lancets lancets SMARTSIG:Topical   Blood Glucose Monitoring Suppl KIT Accucheck Guide with lancets and test strips #100 with one refill.  Test blood sugar twice daily. DX Code E11.65 ; Z79.4   Continuous Blood Gluc Sensor (FREESTYLE LIBRE 2 SENSOR) MISC 1 each by Does not apply route every 14 (fourteen) days.   [EXPIRED] Elastic Bandages & Supports (MEDICAL COMPRESSION STOCKINGS) MISC 1 Package by Does not apply route in the morning.   Incontinence Supply Disposable (COMFORT PROTECT ADULT DIAPER/L) MISC 1 Units by Does not apply route daily. Use 3-4 a day as needed   Insulin Pen Needle 32G X 6 MM MISC Daily   losartan (COZAAR) 25 MG tablet Take 1 tablet (25 mg total) by mouth daily.   melatonin 1 MG TABS tablet Take 3 tablets (3 mg total) by mouth at bedtime as needed.   mirtazapine (REMERON) 7.5 MG tablet Take 1 tablet (7.5 mg total) by mouth at bedtime.   Misc. Devices MISC One pair of Compression stockings  Hemiplegia and hemiparesis following cerebral infarction affecting left non-dominant side (HCC)  - Primary Codes: Y77.412 Lower extremity edema  Codes: R60.0 Type 2 diabetes mellitus with hyperglycemia, with long-term current use of insulin (HCC)  Codes: E11.65, Z79.4   tamsulosin (FLOMAX) 0.4 MG CAPS capsule Take 1 capsule (0.4 mg  total) by mouth daily.   [DISCONTINUED] amLODipine (NORVASC) 10 MG tablet TAKE 1 TABLET BY MOUTH DAILY   [DISCONTINUED] apixaban (ELIQUIS) 5 MG TABS tablet Take 1 tablet (5 mg total) by mouth 2 (two) times daily. Need visit for further refills.   [DISCONTINUED] atorvastatin (LIPITOR) 80 MG tablet TAKE 1 TABLET BY MOUTH EVERY EVENING   [DISCONTINUED] glipiZIDE (GLUCOTROL) 10 MG tablet Take 10 mg by mouth daily before breakfast. (Patient not taking: Reported on 06/09/2022)   [DISCONTINUED] omeprazole (PRILOSEC) 40 MG capsule TAKE 1 CAPSULE BY MOUTH IN THE MORNING AND AT BEDTIME     Allergies:   Patient has no known allergies.   Social History   Socioeconomic History   Marital status: Married    Spouse name: Presenter, broadcasting   Number of children: 5   Years of education: Not on file   Highest education level: Not on file  Occupational History   Occupation: unemployed  Tobacco Use   Smoking status: Former    Packs/day: 1.00    Years: 38.00    Total pack years: 38.00  Types: Cigarettes    Start date: 1982   Smokeless tobacco: Never  Vaping Use   Vaping Use: Never used  Substance and Sexual Activity   Alcohol use: Not Currently    Alcohol/week: 1.0 standard drink of alcohol    Types: 1 Cans of beer per week    Comment: previously drank but nothing in 1-2 yrs (08/2019).   Drug use: Not Currently    Types: Cocaine, Marijuana    Comment: prev used cocaine/marijuana but none x 1-2 yrs (08/2019).   Sexual activity: Yes  Other Topics Concern   Not on file  Social History Narrative   Lives in [place] with his wife.  Uses a cane when ambulating.  Can walk about 25 yds prior to having to rest - says he stumbles if he has to work further than that.  Usually uses a scooter to get around stores.   Social Determinants of Health   Financial Resource Strain: Low Risk  (04/30/2022)   Overall Financial Resource Strain (CARDIA)    Difficulty of Paying Living Expenses: Not very hard  Food  Insecurity: No Food Insecurity (04/21/2022)   Hunger Vital Sign    Worried About Running Out of Food in the Last Year: Never true    Ran Out of Food in the Last Year: Never true  Transportation Needs: No Transportation Needs (04/30/2022)   PRAPARE - Hydrologist (Medical): No    Lack of Transportation (Non-Medical): No  Physical Activity: Unknown (11/25/2018)   Exercise Vital Sign    Days of Exercise per Week: 0 days    Minutes of Exercise per Session: Not asked  Stress: No Stress Concern Present (11/25/2018)   [place]    Feeling of Stress : Only a little  Social Connections: Moderately Integrated (01/13/2018)   Social Connection and Isolation Panel [NHANES]    Frequency of Communication with Friends and Family: More than three times a week    Frequency of Social Gatherings with Friends and Family: Once a week    Attends Religious Services: More than 4 times per year    Active Member of [employer] or Organizations: No    Attends Music therapist: Never    Marital Status: Married     Family History: The patient's family history includes Diabetes in his father; Heart attack in his father; Hypertension in his father and mother; Stroke in his mother.  ROS:   Please see the history of present illness.     All other systems reviewed and are negative.  EKGs/Labs/Other Studies Reviewed:    The following studies were reviewed today:  Cardiac cath 12/2021 Conclusions:  Mild-moderate, non-obstructive coronary artery disease with up to 50% stenoses involving D2, small mid LCx, and ostial RCA. Normal left ventricular filling pressure (LVEDP 12 mmHg).   Recommendations: Medical therapy and risk factor modification to prevent progression of coronary artery disease. Ongoing management of symptomatic bradycardia per primary teams and electrophysiology.   Nelva Bush, MD [facility]    Cardiac MRI 12/2021 IMPRESSION: 1.  Normal LV size and function, LVEF 51%   2.  Normal RV size and function.   3.  No delayed enhancement or scar noted   4.  Normal native T1 value, elevated ECV.   5. With native T1 value being normal, elevated ECV likely influenced by motion artifact.   6. No findings to suggest infiltrative disease such as sarcoid or amyloid.  Electronically Signed   By: Kate Sable M.D.   On: 01/07/2022 19:14   Echo 10/2021  1. Left ventricular ejection fraction, by estimation, is 50 to 55%. The  left ventricle has low normal function. The left ventricle has no regional  wall motion abnormalities. Left ventricular diastolic parameters are  consistent with Grade I diastolic  dysfunction (impaired relaxation).   2. Right ventricular systolic function is normal. The right ventricular  size is normal. There is normal pulmonary artery systolic pressure. The  estimated right ventricular systolic pressure is 33.0 mmHg.   3. The mitral valve is normal in structure. Mild mitral valve  regurgitation. No evidence of mitral stenosis.   4. The aortic valve is tricuspid. Aortic valve regurgitation is not  visualized. No aortic stenosis is present.   5. The inferior vena cava is normal in size with greater than 50%  respiratory variability, suggesting right atrial pressure of 3 mmHg.   EKG:  EKG is ordered today.  The ekg ordered today demonstrates SB 51bpm, RBBB, PRI 149m, nonspecific T wave changes  Recent Labs: 01/07/2022: TSH 1.870 01/10/2022: Magnesium 2.1 06/09/2022: ALT 43; BUN 28; Creatinine, Ser 1.75; Hemoglobin 9.7; Platelets 230; Potassium 3.8; Sodium 143  Recent Lipid Panel    Component Value Date/Time   CHOL 128 04/22/2022 0519   CHOL 134 11/12/2021 1532   TRIG 91 04/22/2022 0519   HDL 47 04/22/2022 0519   HDL 53 11/12/2021 1532   CHOLHDL 2.7 04/22/2022 0519   VLDL 18 04/22/2022 0519   LDLCALC 63 04/22/2022 0519   LDLCALC 52 11/12/2021  1532   LDLCALC 65 02/13/2020 1417     Physical Exam:    VS:  BP 120/70 (BP Location: Left Arm, Patient Position: Sitting, Cuff Size: Normal)   Pulse (!) 51   Ht 5' 6"  (1.676 m)   Wt 158 lb 4 oz (71.8 kg)   SpO2 99%   BMI 25.54 kg/m     Wt Readings from Last 3 Encounters:  05/22/22 158 lb 4 oz (71.8 kg)  04/21/22 143 lb 8.3 oz (65.1 kg)  04/06/22 155 lb 11.2 oz (70.6 kg)     GEN:  Well nourished, well developed in no acute distress HEENT: Normal NECK: No JVD; No carotid bruits LYMPHATICS: No lymphadenopathy CARDIAC: RRR, no murmurs, rubs, gallops RESPIRATORY:  Clear to auscultation without rales, wheezing or rhonchi  ABDOMEN: Soft, non-tender, non-distended MUSCULOSKELETAL:  No edema; No deformity  SKIN: Warm and dry NEUROLOGIC:  right and left sided weakness PSYCHIATRIC:  Normal affect   ASSESSMENT:    1. Paroxysmal A-fib ([place]   2. Bradycardia   3. Heart block AV second degree   4. Diastolic dysfunction   5. Type 2 diabetes mellitus with other circulatory complication, with long-term current use of insulin (HCC)    PLAN:    In order of problems listed above:  Paroxysmal Afib EKG shows NSR. He is on Eliquis 571mBID. He has had multiple strokes. CHASVASC of 5. No BB due to bradycardia.  Recurrent strokes, recent left pontine infarct Recent recurrent hospitalization for stoke. He has residual right sided weakness, he is doing PT. He has follow-up with neurology in December.   H/o Mobitz Type 1 and 2nd degree AV block Symptomatic bradycardia Noted to have symptomatic bradycardia in prior hospitalization. LHC showed mild to mod nonobstructive CAD. cMRI showed normal LV thickness, LVEF 51%, no infiltrative disease. Saw EP who recommended a heart monitor, this was unremarkable. EKG today shows SB, 51bpm,  nonspecific T wave changes. Avoid heart rate lowering medications.   Coronary artery calcification Prior LHC showed nonobstructive CAD. No chest pain reported. No  ASA with Eliquis. Continue statin therapy.   Disposition: Follow up in 3 month(s) with MD/APP     Signed, Earla Charlie Ninfa Meeker, PA-C  06/11/2022 2:38 PM    [facility]

## 2022-05-26 ENCOUNTER — Ambulatory Visit: Payer: Medicare Other | Admitting: Occupational Therapy

## 2022-05-26 DIAGNOSIS — Z7901 Long term (current) use of anticoagulants: Secondary | ICD-10-CM | POA: Diagnosis not present

## 2022-05-26 DIAGNOSIS — I6523 Occlusion and stenosis of bilateral carotid arteries: Secondary | ICD-10-CM | POA: Diagnosis not present

## 2022-05-26 DIAGNOSIS — I69351 Hemiplegia and hemiparesis following cerebral infarction affecting right dominant side: Secondary | ICD-10-CM | POA: Diagnosis not present

## 2022-05-26 DIAGNOSIS — I69354 Hemiplegia and hemiparesis following cerebral infarction affecting left non-dominant side: Secondary | ICD-10-CM | POA: Diagnosis not present

## 2022-05-26 DIAGNOSIS — I639 Cerebral infarction, unspecified: Secondary | ICD-10-CM | POA: Diagnosis not present

## 2022-05-26 DIAGNOSIS — I69322 Dysarthria following cerebral infarction: Secondary | ICD-10-CM | POA: Diagnosis not present

## 2022-05-26 DIAGNOSIS — N1831 Chronic kidney disease, stage 3a: Secondary | ICD-10-CM | POA: Diagnosis not present

## 2022-05-26 DIAGNOSIS — E7800 Pure hypercholesterolemia, unspecified: Secondary | ICD-10-CM | POA: Diagnosis not present

## 2022-05-26 DIAGNOSIS — I5030 Unspecified diastolic (congestive) heart failure: Secondary | ICD-10-CM | POA: Diagnosis not present

## 2022-05-26 DIAGNOSIS — I110 Hypertensive heart disease with heart failure: Secondary | ICD-10-CM | POA: Diagnosis not present

## 2022-05-26 DIAGNOSIS — I480 Paroxysmal atrial fibrillation: Secondary | ICD-10-CM | POA: Diagnosis not present

## 2022-05-26 DIAGNOSIS — E1322 Other specified diabetes mellitus with diabetic chronic kidney disease: Secondary | ICD-10-CM | POA: Diagnosis not present

## 2022-05-26 DIAGNOSIS — Z7984 Long term (current) use of oral hypoglycemic drugs: Secondary | ICD-10-CM | POA: Diagnosis not present

## 2022-05-26 DIAGNOSIS — E1365 Other specified diabetes mellitus with hyperglycemia: Secondary | ICD-10-CM | POA: Diagnosis not present

## 2022-05-26 DIAGNOSIS — Z87891 Personal history of nicotine dependence: Secondary | ICD-10-CM | POA: Diagnosis not present

## 2022-05-26 DIAGNOSIS — Z9181 History of falling: Secondary | ICD-10-CM | POA: Diagnosis not present

## 2022-05-27 ENCOUNTER — Telehealth: Payer: Self-pay | Admitting: Nurse Practitioner

## 2022-05-27 ENCOUNTER — Ambulatory Visit: Payer: Medicare Other | Admitting: Occupational Therapy

## 2022-05-27 NOTE — Telephone Encounter (Signed)
Evan Townsend is PT w/ [facility]. He calls to let the dr know pt is having significant pain in Left hand, and it is causing him to lose sleep at night. (Due to his stroke) In addition, pt has been unable to get pain medicine from the dr, and this is affecting his performance in therapy. Please advise Cb [phone]

## 2022-05-28 DIAGNOSIS — I5030 Unspecified diastolic (congestive) heart failure: Secondary | ICD-10-CM | POA: Diagnosis not present

## 2022-05-28 DIAGNOSIS — I480 Paroxysmal atrial fibrillation: Secondary | ICD-10-CM | POA: Diagnosis not present

## 2022-05-28 DIAGNOSIS — I69351 Hemiplegia and hemiparesis following cerebral infarction affecting right dominant side: Secondary | ICD-10-CM | POA: Diagnosis not present

## 2022-05-28 DIAGNOSIS — E1365 Other specified diabetes mellitus with hyperglycemia: Secondary | ICD-10-CM | POA: Diagnosis not present

## 2022-05-28 DIAGNOSIS — Z9181 History of falling: Secondary | ICD-10-CM | POA: Diagnosis not present

## 2022-05-28 DIAGNOSIS — I639 Cerebral infarction, unspecified: Secondary | ICD-10-CM | POA: Diagnosis not present

## 2022-05-28 DIAGNOSIS — I69322 Dysarthria following cerebral infarction: Secondary | ICD-10-CM | POA: Diagnosis not present

## 2022-05-28 DIAGNOSIS — E7800 Pure hypercholesterolemia, unspecified: Secondary | ICD-10-CM | POA: Diagnosis not present

## 2022-05-28 DIAGNOSIS — I69354 Hemiplegia and hemiparesis following cerebral infarction affecting left non-dominant side: Secondary | ICD-10-CM | POA: Diagnosis not present

## 2022-05-28 DIAGNOSIS — Z87891 Personal history of nicotine dependence: Secondary | ICD-10-CM | POA: Diagnosis not present

## 2022-05-28 DIAGNOSIS — N1831 Chronic kidney disease, stage 3a: Secondary | ICD-10-CM | POA: Diagnosis not present

## 2022-05-28 DIAGNOSIS — E1322 Other specified diabetes mellitus with diabetic chronic kidney disease: Secondary | ICD-10-CM | POA: Diagnosis not present

## 2022-05-28 DIAGNOSIS — I110 Hypertensive heart disease with heart failure: Secondary | ICD-10-CM | POA: Diagnosis not present

## 2022-05-28 DIAGNOSIS — Z7984 Long term (current) use of oral hypoglycemic drugs: Secondary | ICD-10-CM | POA: Diagnosis not present

## 2022-05-28 DIAGNOSIS — I6523 Occlusion and stenosis of bilateral carotid arteries: Secondary | ICD-10-CM | POA: Diagnosis not present

## 2022-05-28 DIAGNOSIS — Z7901 Long term (current) use of anticoagulants: Secondary | ICD-10-CM | POA: Diagnosis not present

## 2022-06-02 ENCOUNTER — Encounter: Payer: Medicare Other | Admitting: Occupational Therapy

## 2022-06-02 DIAGNOSIS — E1365 Other specified diabetes mellitus with hyperglycemia: Secondary | ICD-10-CM | POA: Diagnosis not present

## 2022-06-02 DIAGNOSIS — I5030 Unspecified diastolic (congestive) heart failure: Secondary | ICD-10-CM | POA: Diagnosis not present

## 2022-06-02 DIAGNOSIS — E1322 Other specified diabetes mellitus with diabetic chronic kidney disease: Secondary | ICD-10-CM | POA: Diagnosis not present

## 2022-06-02 DIAGNOSIS — I110 Hypertensive heart disease with heart failure: Secondary | ICD-10-CM | POA: Diagnosis not present

## 2022-06-02 DIAGNOSIS — I69322 Dysarthria following cerebral infarction: Secondary | ICD-10-CM | POA: Diagnosis not present

## 2022-06-02 DIAGNOSIS — Z7901 Long term (current) use of anticoagulants: Secondary | ICD-10-CM | POA: Diagnosis not present

## 2022-06-02 DIAGNOSIS — E7800 Pure hypercholesterolemia, unspecified: Secondary | ICD-10-CM | POA: Diagnosis not present

## 2022-06-02 DIAGNOSIS — I69351 Hemiplegia and hemiparesis following cerebral infarction affecting right dominant side: Secondary | ICD-10-CM | POA: Diagnosis not present

## 2022-06-02 DIAGNOSIS — N1831 Chronic kidney disease, stage 3a: Secondary | ICD-10-CM | POA: Diagnosis not present

## 2022-06-02 DIAGNOSIS — I639 Cerebral infarction, unspecified: Secondary | ICD-10-CM | POA: Diagnosis not present

## 2022-06-02 DIAGNOSIS — I480 Paroxysmal atrial fibrillation: Secondary | ICD-10-CM | POA: Diagnosis not present

## 2022-06-02 DIAGNOSIS — Z9181 History of falling: Secondary | ICD-10-CM | POA: Diagnosis not present

## 2022-06-02 DIAGNOSIS — I6523 Occlusion and stenosis of bilateral carotid arteries: Secondary | ICD-10-CM | POA: Diagnosis not present

## 2022-06-02 DIAGNOSIS — Z87891 Personal history of nicotine dependence: Secondary | ICD-10-CM | POA: Diagnosis not present

## 2022-06-02 DIAGNOSIS — I69354 Hemiplegia and hemiparesis following cerebral infarction affecting left non-dominant side: Secondary | ICD-10-CM | POA: Diagnosis not present

## 2022-06-02 DIAGNOSIS — Z7984 Long term (current) use of oral hypoglycemic drugs: Secondary | ICD-10-CM | POA: Diagnosis not present

## 2022-06-03 ENCOUNTER — Encounter: Payer: Medicare Other | Admitting: Occupational Therapy

## 2022-06-04 ENCOUNTER — Ambulatory Visit (INDEPENDENT_AMBULATORY_CARE_PROVIDER_SITE_OTHER): Payer: Medicare Other | Admitting: Podiatry

## 2022-06-04 ENCOUNTER — Encounter: Payer: Self-pay | Admitting: Podiatry

## 2022-06-04 DIAGNOSIS — E1142 Type 2 diabetes mellitus with diabetic polyneuropathy: Secondary | ICD-10-CM

## 2022-06-04 DIAGNOSIS — I69351 Hemiplegia and hemiparesis following cerebral infarction affecting right dominant side: Secondary | ICD-10-CM | POA: Diagnosis not present

## 2022-06-04 DIAGNOSIS — I639 Cerebral infarction, unspecified: Secondary | ICD-10-CM | POA: Diagnosis not present

## 2022-06-04 DIAGNOSIS — Z7901 Long term (current) use of anticoagulants: Secondary | ICD-10-CM | POA: Diagnosis not present

## 2022-06-04 DIAGNOSIS — M79675 Pain in left toe(s): Secondary | ICD-10-CM | POA: Diagnosis not present

## 2022-06-04 DIAGNOSIS — B351 Tinea unguium: Secondary | ICD-10-CM

## 2022-06-04 DIAGNOSIS — I6523 Occlusion and stenosis of bilateral carotid arteries: Secondary | ICD-10-CM | POA: Diagnosis not present

## 2022-06-04 DIAGNOSIS — D689 Coagulation defect, unspecified: Secondary | ICD-10-CM | POA: Diagnosis not present

## 2022-06-04 DIAGNOSIS — I5030 Unspecified diastolic (congestive) heart failure: Secondary | ICD-10-CM | POA: Diagnosis not present

## 2022-06-04 DIAGNOSIS — I110 Hypertensive heart disease with heart failure: Secondary | ICD-10-CM | POA: Diagnosis not present

## 2022-06-04 DIAGNOSIS — Z9181 History of falling: Secondary | ICD-10-CM | POA: Diagnosis not present

## 2022-06-04 DIAGNOSIS — N182 Chronic kidney disease, stage 2 (mild): Secondary | ICD-10-CM | POA: Diagnosis not present

## 2022-06-04 DIAGNOSIS — I69354 Hemiplegia and hemiparesis following cerebral infarction affecting left non-dominant side: Secondary | ICD-10-CM | POA: Diagnosis not present

## 2022-06-04 DIAGNOSIS — Z87891 Personal history of nicotine dependence: Secondary | ICD-10-CM | POA: Diagnosis not present

## 2022-06-04 DIAGNOSIS — M79674 Pain in right toe(s): Secondary | ICD-10-CM

## 2022-06-04 DIAGNOSIS — I480 Paroxysmal atrial fibrillation: Secondary | ICD-10-CM | POA: Diagnosis not present

## 2022-06-04 DIAGNOSIS — N1831 Chronic kidney disease, stage 3a: Secondary | ICD-10-CM | POA: Diagnosis not present

## 2022-06-04 DIAGNOSIS — Z7984 Long term (current) use of oral hypoglycemic drugs: Secondary | ICD-10-CM | POA: Diagnosis not present

## 2022-06-04 DIAGNOSIS — E7800 Pure hypercholesterolemia, unspecified: Secondary | ICD-10-CM | POA: Diagnosis not present

## 2022-06-04 DIAGNOSIS — I69322 Dysarthria following cerebral infarction: Secondary | ICD-10-CM | POA: Diagnosis not present

## 2022-06-04 DIAGNOSIS — E1322 Other specified diabetes mellitus with diabetic chronic kidney disease: Secondary | ICD-10-CM | POA: Diagnosis not present

## 2022-06-04 DIAGNOSIS — E1365 Other specified diabetes mellitus with hyperglycemia: Secondary | ICD-10-CM | POA: Diagnosis not present

## 2022-06-04 NOTE — Progress Notes (Signed)
This patient returns to my office for at risk foot care.  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 Patient is taking eliquis.  .This patient is unable to cut nails himself since the patient cannot reach his nails.These nails are painful walking and wearing shoes.  This patient presents for at risk foot care today.  General Appearance  Alert, conversant and in no acute stress.  Vascular  Dorsalis pedis and posterior tibial  pulses are palpable  bilaterally.  Capillary return is within normal limits  bilaterally. Temperature is within normal limits  bilaterally.  Neurologic  Senn-Weinstein monofilament wire test within normal limits  bilaterally. Muscle power within normal limits bilaterally.  Nails Thick disfigured discolored nails with subungual debris  from hallux to fifth toes bilaterally. No evidence of bacterial infection or drainage bilaterally.  Orthopedic  No limitations of motion  feet .  No crepitus or effusions noted.  No bony pathology or digital deformities noted.  Skin  normotropic skin with no porokeratosis noted bilaterally.  No signs of infections or ulcers noted.     Onychomycosis  Pain in right toes  Pain in left toes  Consent was obtained for treatment procedures.   Mechanical debridement of nails 1-5  bilaterally performed with a nail nipper.  Filed with dremel without incident. No infection or ulcer.     Return office visit   10 weeks                  Told patient to return for periodic foot care and evaluation due to potential at risk complications.   Gardiner Barefoot DPM

## 2022-06-08 ENCOUNTER — Telehealth: Payer: Self-pay | Admitting: Nurse Practitioner

## 2022-06-08 NOTE — Telephone Encounter (Signed)
Spoke with Robert Lee, no further action needed.

## 2022-06-08 NOTE — Telephone Encounter (Signed)
Okay to give verbal order.

## 2022-06-08 NOTE — Telephone Encounter (Signed)
Cathy with [facility] is calling to request verbal order for Speech Therapy  Frequency- to continue to be effective 82/50/53 1 w 1 certification ends after that and will call back  CB- [phone] Verbal ok on VM.

## 2022-06-09 ENCOUNTER — Encounter: Payer: Medicare Other | Admitting: Occupational Therapy

## 2022-06-09 ENCOUNTER — Encounter: Payer: Self-pay | Admitting: Nurse Practitioner

## 2022-06-09 ENCOUNTER — Ambulatory Visit: Payer: Medicare Other | Admitting: Nurse Practitioner

## 2022-06-09 VITALS — BP 120/68 | HR 57 | Temp 98.0°F

## 2022-06-09 DIAGNOSIS — I69322 Dysarthria following cerebral infarction: Secondary | ICD-10-CM | POA: Diagnosis not present

## 2022-06-09 DIAGNOSIS — I639 Cerebral infarction, unspecified: Secondary | ICD-10-CM | POA: Diagnosis not present

## 2022-06-09 DIAGNOSIS — M25532 Pain in left wrist: Secondary | ICD-10-CM | POA: Diagnosis not present

## 2022-06-09 DIAGNOSIS — N183 Chronic kidney disease, stage 3 unspecified: Secondary | ICD-10-CM

## 2022-06-09 DIAGNOSIS — N1832 Chronic kidney disease, stage 3b: Secondary | ICD-10-CM | POA: Diagnosis not present

## 2022-06-09 DIAGNOSIS — E1165 Type 2 diabetes mellitus with hyperglycemia: Secondary | ICD-10-CM

## 2022-06-09 DIAGNOSIS — I69354 Hemiplegia and hemiparesis following cerebral infarction affecting left non-dominant side: Secondary | ICD-10-CM | POA: Diagnosis not present

## 2022-06-09 DIAGNOSIS — I6523 Occlusion and stenosis of bilateral carotid arteries: Secondary | ICD-10-CM | POA: Diagnosis not present

## 2022-06-09 DIAGNOSIS — Z23 Encounter for immunization: Secondary | ICD-10-CM | POA: Diagnosis not present

## 2022-06-09 DIAGNOSIS — E7800 Pure hypercholesterolemia, unspecified: Secondary | ICD-10-CM | POA: Diagnosis not present

## 2022-06-09 DIAGNOSIS — E1322 Other specified diabetes mellitus with diabetic chronic kidney disease: Secondary | ICD-10-CM | POA: Diagnosis not present

## 2022-06-09 DIAGNOSIS — D631 Anemia in chronic kidney disease: Secondary | ICD-10-CM

## 2022-06-09 DIAGNOSIS — I69351 Hemiplegia and hemiparesis following cerebral infarction affecting right dominant side: Secondary | ICD-10-CM | POA: Diagnosis not present

## 2022-06-09 DIAGNOSIS — I5030 Unspecified diastolic (congestive) heart failure: Secondary | ICD-10-CM | POA: Diagnosis not present

## 2022-06-09 DIAGNOSIS — E1122 Type 2 diabetes mellitus with diabetic chronic kidney disease: Secondary | ICD-10-CM | POA: Diagnosis not present

## 2022-06-09 DIAGNOSIS — Z7901 Long term (current) use of anticoagulants: Secondary | ICD-10-CM | POA: Diagnosis not present

## 2022-06-09 DIAGNOSIS — E785 Hyperlipidemia, unspecified: Secondary | ICD-10-CM

## 2022-06-09 DIAGNOSIS — N1831 Chronic kidney disease, stage 3a: Secondary | ICD-10-CM | POA: Diagnosis not present

## 2022-06-09 DIAGNOSIS — Z9181 History of falling: Secondary | ICD-10-CM | POA: Diagnosis not present

## 2022-06-09 DIAGNOSIS — I10 Essential (primary) hypertension: Secondary | ICD-10-CM | POA: Diagnosis not present

## 2022-06-09 DIAGNOSIS — Z87891 Personal history of nicotine dependence: Secondary | ICD-10-CM | POA: Diagnosis not present

## 2022-06-09 DIAGNOSIS — I110 Hypertensive heart disease with heart failure: Secondary | ICD-10-CM | POA: Diagnosis not present

## 2022-06-09 DIAGNOSIS — I480 Paroxysmal atrial fibrillation: Secondary | ICD-10-CM | POA: Diagnosis not present

## 2022-06-09 DIAGNOSIS — Z794 Long term (current) use of insulin: Secondary | ICD-10-CM | POA: Diagnosis not present

## 2022-06-09 DIAGNOSIS — Z7984 Long term (current) use of oral hypoglycemic drugs: Secondary | ICD-10-CM | POA: Diagnosis not present

## 2022-06-09 DIAGNOSIS — E1365 Other specified diabetes mellitus with hyperglycemia: Secondary | ICD-10-CM | POA: Diagnosis not present

## 2022-06-09 NOTE — Assessment & Plan Note (Signed)
Hx of strokes that have resulted in left-sided hemiparesis and weakness.  He has been working with PT, OT and speech.  Wife states she has noticed improvement in strength and speech.   Has an appt with Neurology in December or January- per wife.  He has resumed Eliquis 5 mg PO BID per Neurology  Will collaborate with Neurology as indicated Follow up in 2 months.  Call sooner if concerns arise.

## 2022-06-09 NOTE — Progress Notes (Signed)
BP 120/68   Pulse (!) 57   Temp 98 F (36.7 C) (Oral)   SpO2 98%    Subjective:    Patient ID: Evan Cones Sr., male    DOB: 1955-09-29, 66 y.o.   MRN: [id number]  HPI: Evan HELLARD Sr. is a 66 y.o. male  Chief Complaint  Patient presents with   Follow-up    Nurse aide states she needs an order for a hospital bed for patient, also states she would like to discuss possibility of pain medication for his hand   HYPERTENSION with Chronic Kidney Disease Hypertension status: controlled  Satisfied with current treatment? yes Duration of hypertension: years BP monitoring frequency:  daily BP range: 130/80 BP medication side effects:  no Medication compliance: excellent compliance Previous BP meds:amlodipine and losartan (cozaar) Aspirin:  taking plavix Recurrent headaches: no Visual changes: no Palpitations: no Dyspnea: no Chest pain: no Lower extremity edema: no Dizzy/lightheaded: no  DIABETES Hypoglycemic episodes:no Polydipsia/polyuria: no Visual disturbance: no Chest pain: no Paresthesias: no Glucose Monitoring: yes  Accucheck frequency: Daily  Fasting glucose:   Post prandial:  Evening:  Before meals: Taking Insulin?: no  Long acting insulin:  Short acting insulin: Blood Pressure Monitoring: daily Retinal Examination:  needs an update eye exam Foot Exam: Up to Date Diabetic Education: Not Completed Pneumovax: Up to Date Influenza: Up to Date Aspirin: no  Patient is seeing OT, PT, and speech whom come to the house.  Nurse is seeing improvements since they have been working with patient.  However, wife feels like patient would benefit from hospital bed.  He is not able to reposition himself in bed due to right sided weakness.  Patient states his left hand started after his most recent stroke.  He holds onto a towel due to contracture.  Did not fal on wrist.  States it doesn't hurt when he moves it.  Pain is deeper down inside.    Relevant past  medical, surgical, family and social history reviewed and updated as indicated. Interim medical history since our last visit reviewed. Allergies and medications reviewed and updated.  Review of Systems  Eyes:  Negative for visual disturbance.  Respiratory:  Negative for chest tightness and shortness of breath.   Cardiovascular:  Negative for chest pain, palpitations and leg swelling.  Endocrine: Negative for polydipsia and polyuria.  Neurological:  Positive for speech difficulty and weakness. Negative for dizziness, light-headedness, numbness and headaches.    Per HPI unless specifically indicated above     Objective:    BP 120/68   Pulse (!) 57   Temp 98 F (36.7 C) (Oral)   SpO2 98%   Wt Readings from Last 3 Encounters:  05/22/22 158 lb 4 oz (71.8 kg)  04/21/22 143 lb 8.3 oz (65.1 kg)  04/06/22 155 lb 11.2 oz (70.6 kg)    Physical Exam Vitals and nursing note reviewed.  Constitutional:      General: He is not in acute distress.    Appearance: Normal appearance. He is not ill-appearing, toxic-appearing or diaphoretic.  HENT:     Head: Normocephalic.     Right Ear: External ear normal.     Left Ear: External ear normal.     Nose: Nose normal. No congestion or rhinorrhea.     Mouth/Throat:     Mouth: Mucous membranes are moist.  Eyes:     General:        Right eye: No discharge.  Left eye: No discharge.     Extraocular Movements: Extraocular movements intact.     Conjunctiva/sclera: Conjunctivae normal.     Pupils: Pupils are equal, round, and reactive to light.  Cardiovascular:     Rate and Rhythm: Normal rate and regular rhythm.     Heart sounds: No murmur heard. Pulmonary:     Effort: Pulmonary effort is normal. No respiratory distress.     Breath sounds: Normal breath sounds. No wheezing, rhonchi or rales.  Abdominal:     General: Abdomen is flat. Bowel sounds are normal.  Musculoskeletal:     Left wrist: Tenderness present. No swelling or bony  tenderness. Decreased range of motion.     Cervical back: Normal range of motion and neck supple.  Skin:    General: Skin is warm and dry.     Capillary Refill: Capillary refill takes less than 2 seconds.  Neurological:     General: No focal deficit present.     Mental Status: He is alert and oriented to person, place, and time.     Motor: Weakness (left sided weakness) present.     Comments: Difficulty with speech  Psychiatric:        Mood and Affect: Mood normal.        Behavior: Behavior normal.        Thought Content: Thought content normal.        Judgment: Judgment normal.     Results for orders placed or performed during the hospital encounter of 04/21/22  SARS Coronavirus 2 by RT PCR (hospital order, performed in [facility] hospital lab) *cepheid single result test* Anterior Nasal Swab   Specimen: Anterior Nasal Swab  Result Value Ref Range   SARS Coronavirus 2 by RT PCR NEGATIVE NEGATIVE  Culture, blood (Routine X 2) w Reflex to ID Panel   Specimen: BLOOD  Result Value Ref Range   Specimen Description BLOOD RIGHT ANTECUBITAL    Special Requests      BOTTLES DRAWN AEROBIC AND ANAEROBIC Blood Culture adequate volume   Culture      NO GROWTH 5 DAYS Performed at [facility], [address] [place], [place] [address]    Report Status 04/27/2022 FINAL   Culture, blood (Routine X 2) w Reflex to ID Panel   Specimen: BLOOD  Result Value Ref Range   Specimen Description BLOOD BLOOD RIGHT HAND    Special Requests      BOTTLES DRAWN AEROBIC AND ANAEROBIC Blood Culture results may not be optimal due to an excessive volume of blood received in culture bottles   Culture      NO GROWTH 5 DAYS Performed at [facility], [address] [place], [place] [address]    Report Status 04/27/2022 FINAL   Protime-INR  Result Value Ref Range   Prothrombin Time 13.6 11.4 - 15.2 seconds   INR 1.1 0.8 - 1.2  APTT  Result Value Ref Range   aPTT 37 (H) 24 - 36  seconds  CBC  Result Value Ref Range   WBC 5.6 4.0 - 10.5 K/uL   RBC 4.00 (L) 4.22 - 5.81 MIL/uL   Hemoglobin 10.6 (L) 13.0 - 17.0 g/dL   HCT 33.5 (L) 39.0 - 52.0 %   MCV 83.8 80.0 - 100.0 fL   MCH 26.5 26.0 - 34.0 pg   MCHC 31.6 30.0 - 36.0 g/dL   RDW 19.0 (H) 11.5 - 15.5 %   Platelets 267 150 - 400 K/uL  nRBC 0.0 0.0 - 0.2 %  Differential  Result Value Ref Range   Neutrophils Relative % 54 %   Neutro Abs 3.0 1.7 - 7.7 K/uL   Lymphocytes Relative 35 %   Lymphs Abs 2.0 0.7 - 4.0 K/uL   Monocytes Relative 9 %   Monocytes Absolute 0.5 0.1 - 1.0 K/uL   Eosinophils Relative 1 %   Eosinophils Absolute 0.1 0.0 - 0.5 K/uL   Basophils Relative 1 %   Basophils Absolute 0.0 0.0 - 0.1 K/uL   Immature Granulocytes 0 %   Abs Immature Granulocytes 0.01 0.00 - 0.07 K/uL  Comprehensive metabolic panel  Result Value Ref Range   Sodium 141 135 - 145 mmol/L   Potassium 3.8 3.5 - 5.1 mmol/L   Chloride 113 (H) 98 - 111 mmol/L   CO2 21 (L) 22 - 32 mmol/L   Glucose, Bld 114 (H) 70 - 99 mg/dL   BUN 27 (H) 8 - 23 mg/dL   Creatinine, Ser 1.82 (H) 0.61 - 1.24 mg/dL   Calcium 9.3 8.9 - 10.3 mg/dL   Total Protein 8.1 6.5 - 8.1 g/dL   Albumin 3.7 3.5 - 5.0 g/dL   AST 29 15 - 41 U/L   ALT 47 (H) 0 - 44 U/L   Alkaline Phosphatase 164 (H) 38 - 126 U/L   Total Bilirubin 0.7 0.3 - 1.2 mg/dL   GFR, Estimated 41 (L) >60 mL/min   Anion gap 7 5 - 15  Urinalysis, Routine w reflex microscopic  Result Value Ref Range   Color, Urine YELLOW (A) YELLOW   APPearance HAZY (A) CLEAR   Specific Gravity, Urine 1.014 1.005 - 1.030   pH 5.0 5.0 - 8.0   Glucose, UA >=500 (A) NEGATIVE mg/dL   Hgb urine dipstick SMALL (A) NEGATIVE   Bilirubin Urine NEGATIVE NEGATIVE   Ketones, ur NEGATIVE NEGATIVE mg/dL   Protein, ur >=300 (A) NEGATIVE mg/dL   Nitrite NEGATIVE NEGATIVE   Leukocytes,Ua NEGATIVE NEGATIVE   RBC / HPF 0-5 0 - 5 RBC/hpf   WBC, UA 0-5 0 - 5 WBC/hpf   Bacteria, UA RARE (A) NONE SEEN   Squamous  Epithelial / LPF 0-5 0 - 5   Mucus PRESENT    Sperm, UA PRESENT   Hemoglobin A1c  Result Value Ref Range   Hgb A1c MFr Bld 7.0 (H) 4.8 - 5.6 %   Mean Plasma Glucose 154.2 mg/dL  Glucose, capillary  Result Value Ref Range   Glucose-Capillary 94 70 - 99 mg/dL  Lipid panel  Result Value Ref Range   Cholesterol 128 0 - 200 mg/dL   Triglycerides 91 <150 mg/dL   HDL 47 >40 mg/dL   Total CHOL/HDL Ratio 2.7 RATIO   VLDL 18 0 - 40 mg/dL   LDL Cholesterol 63 0 - 99 mg/dL  Glucose, capillary  Result Value Ref Range   Glucose-Capillary 83 70 - 99 mg/dL  Glucose, capillary  Result Value Ref Range   Glucose-Capillary 62 (L) 70 - 99 mg/dL  Glucose, capillary  Result Value Ref Range   Glucose-Capillary 107 (H) 70 - 99 mg/dL  Glucose, capillary  Result Value Ref Range   Glucose-Capillary 78 70 - 99 mg/dL  Glucose, capillary  Result Value Ref Range   Glucose-Capillary 85 70 - 99 mg/dL  Glucose, capillary  Result Value Ref Range   Glucose-Capillary 132 (H) 70 - 99 mg/dL  Basic metabolic panel  Result Value Ref Range   Sodium 143 135 - 145  mmol/L   Potassium 3.6 3.5 - 5.1 mmol/L   Chloride 113 (H) 98 - 111 mmol/L   CO2 21 (L) 22 - 32 mmol/L   Glucose, Bld 84 70 - 99 mg/dL   BUN 22 8 - 23 mg/dL   Creatinine, Ser 1.58 (H) 0.61 - 1.24 mg/dL   Calcium 9.2 8.9 - 10.3 mg/dL   GFR, Estimated 48 (L) >60 mL/min   Anion gap 9 5 - 15  CBC  Result Value Ref Range   WBC 5.6 4.0 - 10.5 K/uL   RBC 3.80 (L) 4.22 - 5.81 MIL/uL   Hemoglobin 10.1 (L) 13.0 - 17.0 g/dL   HCT 32.0 (L) 39.0 - 52.0 %   MCV 84.2 80.0 - 100.0 fL   MCH 26.6 26.0 - 34.0 pg   MCHC 31.6 30.0 - 36.0 g/dL   RDW 18.7 (H) 11.5 - 15.5 %   Platelets 257 150 - 400 K/uL   nRBC 0.0 0.0 - 0.2 %  Glucose, capillary  Result Value Ref Range   Glucose-Capillary 91 70 - 99 mg/dL  Glucose, capillary  Result Value Ref Range   Glucose-Capillary 74 70 - 99 mg/dL  Glucose, capillary  Result Value Ref Range   Glucose-Capillary 166 (H)  70 - 99 mg/dL  ECHOCARDIOGRAM COMPLETE  Result Value Ref Range   Weight 2,296.31 oz   Height 65 in   BP 184/92 mmHg   Ao pk vel 1.05 m/s   AV Area VTI 1.98 cm2   AR max vel 1.94 cm2   AV Mean grad 3.0 mmHg   AV Peak grad 4.4 mmHg   S' Lateral 2.58 cm   AV Area mean vel 1.85 cm2   Area-P 1/2 3.31 cm2  Troponin I (High Sensitivity)  Result Value Ref Range   Troponin I (High Sensitivity) 10 <18 ng/L  Troponin I (High Sensitivity)  Result Value Ref Range   Troponin I (High Sensitivity) 9 <18 ng/L      Assessment & Plan:   Problem List Items Addressed This Visit       Cardiovascular and Mediastinum   Recurrent strokes (HCC) - Primary    Hx of strokes that have resulted in left-sided hemiparesis and weakness.  He has been working with PT, OT and speech.  Wife states she has noticed improvement in strength and speech.   Has an appt with Neurology in December or January- per wife.  He has resumed Eliquis 5 mg PO BID per Neurology  Will collaborate with Neurology as indicated Follow up in 2 months.  Call sooner if concerns arise.       Relevant Orders   For home use only DME Hospital bed   Essential hypertension    Chronic.  Controlled.  Continue with current medication regimen of Amlodipine and Losartan.  Labs ordered today.  Return to clinic in 2 months for reevaluation.  Call sooner if concerns arise.        Relevant Orders   Comprehensive metabolic panel     Endocrine   Uncontrolled type 2 diabetes mellitus with hyperglycemia, with long-term current use of insulin (HCC)    Chronic.  Controlled.  Last A1c was 7.0. in Sep[place]mber.  Continue with [place].   Not currently taking Glipizide.  Can add back if A1c increases at next visit.  Will check labs at next visit.  Return to clinic in 2 months for reevaluation.  Call sooner if concerns arise.        Relevant Orders  CBC w/Diff   CKD stage 3 due to type 2 diabetes mellitus (HCC)    Chronic.  Controlled.  Continue with  current medication regimen of farxiga.  Avoid NSAIDS.  Labs ordered today.  Return to clinic in 2 months for reevaluation.  Call sooner if concerns arise.          Nervous and Auditory   Hemiplegia and hemiparesis following cerebral infarction affecting left non-dominant side (HCC)    Chronic.  Ongoing.  Working with PT and OT.  Not able to move himself in bed.  Needs assistance with sitting up.  Patient would benefit from hospital bed.  Ordered for patient at visit.  Continue to collaborate with PT and OT.  Follow up in 2 months.  Call sooner if concerns arise.       Relevant Orders   For home use only DME Hospital bed     Genitourinary   Anemia due to stage 3b chronic kidney disease (HCC)    Chronic.  Ongoing.  Labs ordered today.  Has not been taking Iron supplement.  Will make recommendations based on lab results.         Other   Need for influenza vaccination   Relevant Orders   Flu Vaccine QUAD High Dose(Fluad) (Completed)   Dyslipidemia    Chronic.  Controlled.  Continue with current medication regimen.  Labs ordered today.  Return to clinic in 6 months for reevaluation.  Call sooner if concerns arise.        Other Visit Diagnoses     Left wrist pain       Ongoing pain since last stroke.  Will send to Ortho for further evaluation.   Relevant Orders   Ambulatory referral to Orthopedics        Follow up plan: Return in about 2 months (around 08/09/2022) for Welcome to Medicare (43min appt).

## 2022-06-09 NOTE — Assessment & Plan Note (Signed)
Chronic.  Ongoing.  Working with PT and OT.  Not able to move himself in bed.  Needs assistance with sitting up.  Patient would benefit from hospital bed.  Ordered for patient at visit.  Continue to collaborate with PT and OT.  Follow up in 2 months.  Call sooner if concerns arise.

## 2022-06-09 NOTE — Assessment & Plan Note (Addendum)
Chronic.  Controlled.  Last A1c was 7.0. in Sepetmber.  Continue with [place].   Not currently taking Glipizide.  Can add back if A1c increases at next visit.  Will check labs at next visit.  Return to clinic in 2 months for reevaluation.  Call sooner if concerns arise.

## 2022-06-09 NOTE — Assessment & Plan Note (Signed)
Chronic.  Controlled.  Continue with current medication regimen of Amlodipine and Losartan.  Labs ordered today.  Return to clinic in 2 months for reevaluation.  Call sooner if concerns arise.

## 2022-06-09 NOTE — Assessment & Plan Note (Signed)
Chronic.  Ongoing.  Labs ordered today.  Has not been taking Iron supplement.  Will make recommendations based on lab results.

## 2022-06-09 NOTE — Progress Notes (Deleted)
There were no vitals taken for this visit.   Subjective:    Patient ID: Evan Cones Sr., male    DOB: 1956/01/11, 66 y.o.   MRN: [id number]  HPI: Evan Townsend. is a 66 y.o. male presenting on 06/09/2022 for comprehensive medical examination. Current medical complaints include:{Blank single:19197::"none","***"}  He currently lives with: Interim Problems from his last visit: {Blank single:19197::"yes","no"}  Functional Status Survey:    FALL RISK:    04/06/2022    4:04 PM 01/13/2022    3:50 PM 11/12/2021    3:40 PM 06/20/2021   11:38 AM 06/02/2021    2:05 PM  Fall Risk   Falls in the past year? 0 0 1 0 0  Number falls in past yr: 0 0 0 0 0  Injury with Fall? 0 0 0 0 0  Risk for fall due to : History of fall(s);Impaired balance/gait;Impaired mobility Impaired balance/gait;Impaired mobility Impaired mobility;Impaired balance/gait Impaired mobility;Impaired balance/gait No Fall Risks  Follow up Falls evaluation completed Falls evaluation completed Falls evaluation completed  Falls evaluation completed    Depression Screen    04/06/2022    4:04 PM 01/13/2022    3:50 PM 11/12/2021    3:41 PM 06/02/2021    2:06 PM 03/20/2021    3:52 PM  Depression screen PHQ 2/9  Decreased Interest 3 3 0 0 0  Down, Depressed, Hopeless 0 0 0 0   PHQ - 2 Score 3 3 0 0 0  Altered sleeping 0 3 0    Tired, decreased energy 3 3 0    Change in appetite 2 0 0    Feeling bad or failure about yourself  0 0 0    Trouble concentrating 0 0 0    Moving slowly or fidgety/restless 3 0 0    Suicidal thoughts 0 0 0    PHQ-9 Score 11 9 0    Difficult doing work/chores Somewhat difficult  Not difficult at all      Advanced Directives <no information>  Past Medical History:  Past Medical History:  Diagnosis Date   Carotid arterial disease ([place])    a. 08/2018 Carotid U/S: min-mod RICA atherosclerosis w/o hemodynamically significant stenosis. Nl LICA.   Diabetes 1.5, managed as type 2 ([place])     Diastolic dysfunction    a. 08/2018 Echo: EF 65%. No rwma. Gr1 DD. Mild MR.   Diastolic dysfunction    a. 08/2019 Echo: EF 55-60%, Gr1 DD. No rwma. Mild MR. RVSP 37.76mHg.   Hypercholesterolemia    Hypertension    PAF (paroxysmal atrial fibrillation) ([facility]    a. 10/2019 Event Monitor: PAF; b. CHA2DS2VASc = 5-->Eliquis.   Poorly controlled diabetes mellitus ([facility]    a. 04/2019 A1c 13.8.   Recurrent strokes ([facility]    a. 10/2016 MRI/A: Acute 569mR thalamic infarct, ? subacute infarct of R corona radiata; b. 08/2017 MRI/A: Acute 67m55materal L thalamic infarct. Other more remote lacunar infarcts of thalami bilat. Small vessel dzs; c. 08/2018 MRI/A: Acute lacunar infarct of the post limb of R internal capsule; d. 08/2019 MRI Acute CVA of L paramedian pons adn R cerebellar hemisphere.   Tobacco abuse     Surgical History:  Past Surgical History:  Procedure Laterality Date   ESOPHAGOGASTRODUODENOSCOPY (EGD) WITH PROPOFOL N/A 04/26/2019   Procedure: ESOPHAGOGASTRODUODENOSCOPY (EGD) WITH PROPOFOL;  Surgeon: VanLin LandsmanD;  Location: [facility];  Service: Gastroenterology;  Laterality: N/A;   LEFT HEART CATH AND CORONARY ANGIOGRAPHY N/A 01/09/2022  Procedure: LEFT HEART CATH AND CORONARY ANGIOGRAPHY;  Surgeon: Nelva Bush, MD;  Location: [facility];  Service: Cardiovascular;  Laterality: N/A;   NO PAST SURGERIES     TEE WITHOUT CARDIOVERSION N/A 04/22/2022   Procedure: TRANSESOPHAGEAL ECHOCARDIOGRAM (TEE);  Surgeon: Kate Sable, MD;  Location: [facility];  Service: Cardiovascular;  Laterality: N/A;    Medications:  Current Outpatient Medications on File Prior to Visit  Medication Sig   ACCU-CHEK GUIDE test strip Check fsbs once daily   Accu-Chek Softclix Lancets lancets SMARTSIG:Topical   albuterol (VENTOLIN HFA) 108 (90 Base) MCG/ACT inhaler Inhale 2 puffs into the lungs every 6 (six) hours as needed for wheezing or shortness of breath.   amLODipine (NORVASC) 10 MG  tablet TAKE 1 TABLET BY MOUTH DAILY   apixaban (ELIQUIS) 5 MG TABS tablet Take 1 tablet (5 mg total) by mouth 2 (two) times daily. Need visit for further refills.   atorvastatin (LIPITOR) 80 MG tablet TAKE 1 TABLET BY MOUTH EVERY EVENING   Blood Glucose Monitoring Suppl KIT Accucheck Guide with lancets and test strips #100 with one refill.  Test blood sugar twice daily. DX Code E11.65 ; Z79.4   Continuous Blood Gluc Sensor (FREESTYLE LIBRE 2 SENSOR) MISC 1 each by Does not apply route every 14 (fourteen) days.   dapagliflozin propanediol (FARXIGA) 10 MG TABS tablet Take 1 tablet (10 mg total) by mouth daily before breakfast. In place of glipizide   glipiZIDE (GLUCOTROL) 10 MG tablet Take 10 mg by mouth daily before breakfast.   Incontinence Supply Disposable (COMFORT PROTECT ADULT DIAPER/L) MISC 1 Units by Does not apply route daily. Use 3-4 a day as needed   Insulin Pen Needle 32G X 6 MM MISC Daily   iron polysaccharides (NIFEREX) 150 MG capsule Take 1 capsule (150 mg total) by mouth daily. Can take any form of over-the-counter iron supplement.   losartan (COZAAR) 25 MG tablet Take 1 tablet (25 mg total) by mouth daily.   melatonin 1 MG TABS tablet Take 3 tablets (3 mg total) by mouth at bedtime as needed.   metoCLOPramide (REGLAN) 5 MG tablet TAKE 1 TABLET BY MOUTH 3 TIMES DAILY BEFORE MEALS   mirtazapine (REMERON) 7.5 MG tablet Take 1 tablet (7.5 mg total) by mouth at bedtime.   Misc. Devices MISC One pair of Compression stockings  Hemiplegia and hemiparesis following cerebral infarction affecting left non-dominant side (HCC)  - Primary Codes: S31.594 Lower extremity edema  Codes: R60.0 Type 2 diabetes mellitus with hyperglycemia, with long-term current use of insulin (HCC)  Codes: E11.65, Z79.4   nitroGLYCERIN (NITROSTAT) 0.4 MG SL tablet Place 1 tablet (0.4 mg total) under the tongue every 5 (five) minutes as needed for chest pain.   omeprazole (PRILOSEC) 40 MG capsule TAKE 1 CAPSULE BY  MOUTH IN THE MORNING AND AT BEDTIME   tamsulosin (FLOMAX) 0.4 MG CAPS capsule Take 1 capsule (0.4 mg total) by mouth daily.   No current facility-administered medications on file prior to visit.    Allergies:  No Known Allergies  Social History:  Social History   Socioeconomic History   Marital status: Married    Spouse name: Presenter, broadcasting   Number of children: 5   Years of education: Not on file   Highest education level: Not on file  Occupational History   Occupation: unemployed  Tobacco Use   Smoking status: Former    Packs/day: 1.00    Years: 38.00    Total pack years: 38.00  Types: Cigarettes    Start date: 1982   Smokeless tobacco: Never  Vaping Use   Vaping Use: Never used  Substance and Sexual Activity   Alcohol use: Not Currently    Alcohol/week: 1.0 standard drink of alcohol    Types: 1 Cans of beer per week    Comment: previously drank but nothing in 1-2 yrs (08/2019).   Drug use: Not Currently    Types: Cocaine, Marijuana    Comment: prev used cocaine/marijuana but none x 1-2 yrs (08/2019).   Sexual activity: Yes  Other Topics Concern   Not on file  Social History Narrative   Lives in [place] with his wife.  Uses a cane when ambulating.  Can walk about 25 yds prior to having to rest - says he stumbles if he has to work further than that.  Usually uses a scooter to get around stores.   Social Determinants of Health   Financial Resource Strain: Low Risk  (04/30/2022)   Overall Financial Resource Strain (CARDIA)    Difficulty of Paying Living Expenses: Not very hard  Food Insecurity: No Food Insecurity (04/21/2022)   Hunger Vital Sign    Worried About Running Out of Food in the Last Year: Never true    Ran Out of Food in the Last Year: Never true  Transportation Needs: No Transportation Needs (04/30/2022)   PRAPARE - Hydrologist (Medical): No    Lack of Transportation (Non-Medical): No  Physical Activity: Unknown  (11/25/2018)   Exercise Vital Sign    Days of Exercise per Week: 0 days    Minutes of Exercise per Session: Not asked  Stress: No Stress Concern Present (11/25/2018)   [place]    Feeling of Stress : Only a little  Social Connections: Moderately Integrated (01/13/2018)   Social Connection and Isolation Panel [NHANES]    Frequency of Communication with Friends and Family: More than three times a week    Frequency of Social Gatherings with Friends and Family: Once a week    Attends Religious Services: More than 4 times per year    Active Member of [employer] or Organizations: No    Attends Archivist Meetings: Never    Marital Status: Married  Human resources officer Violence: Not At Risk (04/21/2022)   Humiliation, Afraid, Rape, and Kick questionnaire    Fear of Current or Ex-Partner: No    Emotionally Abused: No    Physically Abused: No    Sexually Abused: No   Social History   Tobacco Use  Smoking Status Former   Packs/day: 1.00   Years: 38.00   Total pack years: 38.00   Types: Cigarettes   Start date: 1982  Smokeless Tobacco Never   Social History   Substance and Sexual Activity  Alcohol Use Not Currently   Alcohol/week: 1.0 standard drink of alcohol   Types: 1 Cans of beer per week   Comment: previously drank but nothing in 1-2 yrs (08/2019).    Family History:  Family History  Problem Relation Age of Onset   Hypertension Mother    Stroke Mother        died @ age 63   Hypertension Father    Diabetes Father    Heart attack Father        died @ 77    Past medical history, surgical history, medications, allergies, family history and social history reviewed with patient today and changes made  to appropriate areas of the chart.   ROS All other ROS negative except what is listed above and in the HPI.      Objective:    There were no vitals taken for this visit.  Wt Readings from Last 3 Encounters:   05/22/22 158 lb 4 oz (71.8 kg)  04/21/22 143 lb 8.3 oz (65.1 kg)  04/06/22 155 lb 11.2 oz (70.6 kg)    No results found.  Physical Exam      No data to display          Cognitive Testing - 6-CIT  Correct? Score   What year is it? {YES NO:22349} {Numbers; 0-4:31231} Yes = 0    No = 4  What month is it? {YES NO:22349} {Numbers; 0-4:31231} Yes = 0    No = 3  Remember:     [address]ia [address], [place], [place]     What time is it? {YES NO:22349} {Numbers; 0-4:31231} Yes = 0    No = 3  Count backwards from 20 to 1 {YES NO:22349} {Numbers; 0-4:31231} Correct = 0    1 error = 2   More than 1 error = 4  Say the months of the year in reverse. {YES NO:22349} {Numbers; 0-4:31231} Correct = 0    1 error = 2   More than 1 error = 4  What address did I ask you to remember? {YES NO:22349} {NUMBERS; 0-10:5044} Correct = 0  1 error = 2    2 error = 4    3 error = 6    4 error = 8    All wrong = 10       TOTAL SCORE  {Numbers; 3-71:06269}/48   Interpretation:  {Desc; normal/abnormal:11317::"Normal"}  Normal (0-7) Abnormal (8-28)    Results for orders placed or performed during the hospital encounter of 04/21/22  SARS Coronavirus 2 by RT PCR (hospital order, performed in [facility] lab) *cepheid single result test* Anterior Nasal Swab   Specimen: Anterior Nasal Swab  Result Value Ref Range   SARS Coronavirus 2 by RT PCR NEGATIVE NEGATIVE  Culture, blood (Routine X 2) w Reflex to ID Panel   Specimen: BLOOD  Result Value Ref Range   Specimen Description BLOOD RIGHT ANTECUBITAL    Special Requests      BOTTLES DRAWN AEROBIC AND ANAEROBIC Blood Culture adequate volume   Culture      NO GROWTH 5 DAYS Performed at [facility], [address] [place], [place] [address]    Report Status 04/27/2022 FINAL   Culture, blood (Routine X 2) w Reflex to ID Panel   Specimen: BLOOD  Result Value Ref Range   Specimen Description BLOOD BLOOD RIGHT HAND    Special Requests       BOTTLES DRAWN AEROBIC AND ANAEROBIC Blood Culture results may not be optimal due to an excessive volume of blood received in culture bottles   Culture      NO GROWTH 5 DAYS Performed at [facility], [address] [place], [place] [address]    Report Status 04/27/2022 FINAL   Protime-INR  Result Value Ref Range   Prothrombin Time 13.6 11.4 - 15.2 seconds   INR 1.1 0.8 - 1.2  APTT  Result Value Ref Range   aPTT 37 (H) 24 - 36 seconds  CBC  Result Value Ref Range   WBC 5.6 4.0 - 10.5 K/uL   RBC 4.00 (L) 4.22 - 5.81 MIL/uL  Hemoglobin 10.6 (L) 13.0 - 17.0 g/dL   HCT 33.5 (L) 39.0 - 52.0 %   MCV 83.8 80.0 - 100.0 fL   MCH 26.5 26.0 - 34.0 pg   MCHC 31.6 30.0 - 36.0 g/dL   RDW 19.0 (H) 11.5 - 15.5 %   Platelets 267 150 - 400 K/uL   nRBC 0.0 0.0 - 0.2 %  Differential  Result Value Ref Range   Neutrophils Relative % 54 %   Neutro Abs 3.0 1.7 - 7.7 K/uL   Lymphocytes Relative 35 %   Lymphs Abs 2.0 0.7 - 4.0 K/uL   Monocytes Relative 9 %   Monocytes Absolute 0.5 0.1 - 1.0 K/uL   Eosinophils Relative 1 %   Eosinophils Absolute 0.1 0.0 - 0.5 K/uL   Basophils Relative 1 %   Basophils Absolute 0.0 0.0 - 0.1 K/uL   Immature Granulocytes 0 %   Abs Immature Granulocytes 0.01 0.00 - 0.07 K/uL  Comprehensive metabolic panel  Result Value Ref Range   Sodium 141 135 - 145 mmol/L   Potassium 3.8 3.5 - 5.1 mmol/L   Chloride 113 (H) 98 - 111 mmol/L   CO2 21 (L) 22 - 32 mmol/L   Glucose, Bld 114 (H) 70 - 99 mg/dL   BUN 27 (H) 8 - 23 mg/dL   Creatinine, Ser 1.82 (H) 0.61 - 1.24 mg/dL   Calcium 9.3 8.9 - 10.3 mg/dL   Total Protein 8.1 6.5 - 8.1 g/dL   Albumin 3.7 3.5 - 5.0 g/dL   AST 29 15 - 41 U/L   ALT 47 (H) 0 - 44 U/L   Alkaline Phosphatase 164 (H) 38 - 126 U/L   Total Bilirubin 0.7 0.3 - 1.2 mg/dL   GFR, Estimated 41 (L) >60 mL/min   Anion gap 7 5 - 15  Urinalysis, Routine w reflex microscopic  Result Value Ref Range   Color, Urine YELLOW (A) YELLOW   APPearance  HAZY (A) CLEAR   Specific Gravity, Urine 1.014 1.005 - 1.030   pH 5.0 5.0 - 8.0   Glucose, UA >=500 (A) NEGATIVE mg/dL   Hgb urine dipstick SMALL (A) NEGATIVE   Bilirubin Urine NEGATIVE NEGATIVE   Ketones, ur NEGATIVE NEGATIVE mg/dL   Protein, ur >=300 (A) NEGATIVE mg/dL   Nitrite NEGATIVE NEGATIVE   Leukocytes,Ua NEGATIVE NEGATIVE   RBC / HPF 0-5 0 - 5 RBC/hpf   WBC, UA 0-5 0 - 5 WBC/hpf   Bacteria, UA RARE (A) NONE SEEN   Squamous Epithelial / LPF 0-5 0 - 5   Mucus PRESENT    Sperm, UA PRESENT   Hemoglobin A1c  Result Value Ref Range   Hgb A1c MFr Bld 7.0 (H) 4.8 - 5.6 %   Mean Plasma Glucose 154.2 mg/dL  Glucose, capillary  Result Value Ref Range   Glucose-Capillary 94 70 - 99 mg/dL  Lipid panel  Result Value Ref Range   Cholesterol 128 0 - 200 mg/dL   Triglycerides 91 <150 mg/dL   HDL 47 >40 mg/dL   Total CHOL/HDL Ratio 2.7 RATIO   VLDL 18 0 - 40 mg/dL   LDL Cholesterol 63 0 - 99 mg/dL  Glucose, capillary  Result Value Ref Range   Glucose-Capillary 83 70 - 99 mg/dL  Glucose, capillary  Result Value Ref Range   Glucose-Capillary 62 (L) 70 - 99 mg/dL  Glucose, capillary  Result Value Ref Range   Glucose-Capillary 107 (H) 70 - 99 mg/dL  Glucose, capillary  Result Value  Ref Range   Glucose-Capillary 78 70 - 99 mg/dL  Glucose, capillary  Result Value Ref Range   Glucose-Capillary 85 70 - 99 mg/dL  Glucose, capillary  Result Value Ref Range   Glucose-Capillary 132 (H) 70 - 99 mg/dL  Basic metabolic panel  Result Value Ref Range   Sodium 143 135 - 145 mmol/L   Potassium 3.6 3.5 - 5.1 mmol/L   Chloride 113 (H) 98 - 111 mmol/L   CO2 21 (L) 22 - 32 mmol/L   Glucose, Bld 84 70 - 99 mg/dL   BUN 22 8 - 23 mg/dL   Creatinine, Ser 1.58 (H) 0.61 - 1.24 mg/dL   Calcium 9.2 8.9 - 10.3 mg/dL   GFR, Estimated 48 (L) >60 mL/min   Anion gap 9 5 - 15  CBC  Result Value Ref Range   WBC 5.6 4.0 - 10.5 K/uL   RBC 3.80 (L) 4.22 - 5.81 MIL/uL   Hemoglobin 10.1 (L) 13.0 - 17.0  g/dL   HCT 32.0 (L) 39.0 - 52.0 %   MCV 84.2 80.0 - 100.0 fL   MCH 26.6 26.0 - 34.0 pg   MCHC 31.6 30.0 - 36.0 g/dL   RDW 18.7 (H) 11.5 - 15.5 %   Platelets 257 150 - 400 K/uL   nRBC 0.0 0.0 - 0.2 %  Glucose, capillary  Result Value Ref Range   Glucose-Capillary 91 70 - 99 mg/dL  Glucose, capillary  Result Value Ref Range   Glucose-Capillary 74 70 - 99 mg/dL  Glucose, capillary  Result Value Ref Range   Glucose-Capillary 166 (H) 70 - 99 mg/dL  ECHOCARDIOGRAM COMPLETE  Result Value Ref Range   Weight 2,296.31 oz   Height 65 in   BP 184/92 mmHg   Ao pk vel 1.05 m/s   AV Area VTI 1.98 cm2   AR max vel 1.94 cm2   AV Mean grad 3.0 mmHg   AV Peak grad 4.4 mmHg   S' Lateral 2.58 cm   AV Area mean vel 1.85 cm2   Area-P 1/2 3.31 cm2  Troponin I (High Sensitivity)  Result Value Ref Range   Troponin I (High Sensitivity) 10 <18 ng/L  Troponin I (High Sensitivity)  Result Value Ref Range   Troponin I (High Sensitivity) 9 <18 ng/L      Assessment & Plan:   Problem List Items Addressed This Visit       Cardiovascular and Mediastinum   Recurrent strokes (HCC)   RBBB   Other Visit Diagnoses     Welcome to Medicare preventive visit    -  Primary   Advanced care planning/counseling discussion            Preventative Services:  Health Risk Assessment and Personalized Prevention Plan: Bone Mass Measurements: CVD Screening:  Colon Cancer Screening:  Depression Screening:  Diabetes Screening:  Glaucoma Screening:  Hepatitis B vaccine: Hepatitis C screening:  HIV Screening: Flu Vaccine: Lung cancer Screening: Obesity Screening:  Pneumonia Vaccines (2): STI Screening: PSA screening:  Discussed aspirin prophylaxis for myocardial infarction prevention and decision was {Blank single:19197::"it was not indicated","made to continue ASA","made to start ASA","made to stop ASA","that we recommended ASA, and patient refused"}  LABORATORY TESTING:  Health maintenance labs  ordered today as discussed above.   The natural history of prostate cancer and ongoing controversy regarding screening and potential treatment outcomes of prostate cancer has been discussed with the patient. The meaning of a false positive PSA and a false negative PSA has  been discussed. He indicates understanding of the limitations of this screening test and wishes *** to proceed with screening PSA testing.   IMMUNIZATIONS:   - Tdap: Tetanus vaccination status reviewed: {tetanus status:315746}. - Influenza: {Blank single:19197::"Up to date","Administered today","Postponed to flu season","Refused","Given elsewhere"} - Pneumovax: {Blank single:19197::"Up to date","Administered today","Not applicable","Refused","Given elsewhere"} - Prevnar: {Blank single:19197::"Up to date","Administered today","Not applicable","Refused","Given elsewhere"} - Zostavax vaccine: {Blank single:19197::"Up to date","Administered today","Not applicable","Refused","Given elsewhere"}  SCREENING: - Colonoscopy: {Blank single:19197::"Up to date","Ordered today","Not applicable","Refused","Done elsewhere"}  Discussed with patient purpose of the colonoscopy is to detect colon cancer at curable precancerous or early stages   - AAA Screening: {Blank single:19197::"Up to date","Ordered today","Not applicable","Refused","Done elsewhere"}  -Hearing Test: {Blank single:19197::"Up to date","Ordered today","Not applicable","Refused","Done elsewhere"}  -Spirometry: {Blank single:19197::"Up to date","Ordered today","Not applicable","Refused","Done elsewhere"}   PATIENT COUNSELING:    Sexuality: Discussed sexually transmitted diseases, partner selection, use of condoms, avoidance of unintended pregnancy  and contraceptive alternatives.   Advised to avoid cigarette smoking.  I discussed with the patient that most people either abstain from alcohol or drink within safe limits (<=14/week and <=4 drinks/occasion for males, <=7/weeks and  <= 3 drinks/occasion for females) and that the risk for alcohol disorders and other health effects rises proportionally with the number of drinks per week and how often a drinker exceeds daily limits.  Discussed cessation/primary prevention of drug use and availability of treatment for abuse.   Diet: Encouraged to adjust caloric intake to maintain  or achieve ideal body weight, to reduce intake of dietary saturated fat and total fat, to limit sodium intake by avoiding high sodium foods and not adding table salt, and to maintain adequate dietary potassium and calcium preferably from fresh fruits, vegetables, and low-fat dairy products.    stressed the importance of regular exercise  Injury prevention: Discussed safety belts, safety helmets, smoke detector, smoking near bedding or upholstery.   Dental health: Discussed importance of regular tooth brushing, flossing, and dental visits.   Follow up plan: NEXT PREVENTATIVE PHYSICAL DUE IN 1 YEAR. No follow-ups on file.

## 2022-06-09 NOTE — Assessment & Plan Note (Signed)
Chronic.  Controlled.  Continue with current medication regimen of farxiga.  Avoid NSAIDS.  Labs ordered today.  Return to clinic in 2 months for reevaluation.  Call sooner if concerns arise.

## 2022-06-09 NOTE — Assessment & Plan Note (Signed)
Chronic.  Controlled.  Continue with current medication regimen.  Labs ordered today.  Return to clinic in 6 months for reevaluation.  Call sooner if concerns arise.  ? ?

## 2022-06-10 ENCOUNTER — Encounter: Payer: Medicare Other | Admitting: Occupational Therapy

## 2022-06-10 ENCOUNTER — Other Ambulatory Visit: Payer: Self-pay | Admitting: Nurse Practitioner

## 2022-06-10 DIAGNOSIS — K219 Gastro-esophageal reflux disease without esophagitis: Secondary | ICD-10-CM

## 2022-06-10 DIAGNOSIS — Z794 Long term (current) use of insulin: Secondary | ICD-10-CM

## 2022-06-10 DIAGNOSIS — I4891 Unspecified atrial fibrillation: Secondary | ICD-10-CM

## 2022-06-10 LAB — CBC WITH DIFFERENTIAL/PLATELET
Basophils Absolute: 0 10*3/uL (ref 0.0–0.2)
Basos: 0 %
EOS (ABSOLUTE): 0.1 10*3/uL (ref 0.0–0.4)
Eos: 2 %
Hematocrit: 29.6 % — ABNORMAL LOW (ref 37.5–51.0)
Hemoglobin: 9.7 g/dL — ABNORMAL LOW (ref 13.0–17.7)
Immature Grans (Abs): 0 10*3/uL (ref 0.0–0.1)
Immature Granulocytes: 0 %
Lymphocytes Absolute: 1.7 10*3/uL (ref 0.7–3.1)
Lymphs: 31 %
MCH: 27.1 pg (ref 26.6–33.0)
MCHC: 32.8 g/dL (ref 31.5–35.7)
MCV: 83 fL (ref 79–97)
Monocytes Absolute: 0.3 10*3/uL (ref 0.1–0.9)
Monocytes: 5 %
Neutrophils Absolute: 3.4 10*3/uL (ref 1.4–7.0)
Neutrophils: 62 %
Platelets: 230 10*3/uL (ref 150–450)
RBC: 3.58 x10E6/uL — ABNORMAL LOW (ref 4.14–5.80)
RDW: 18.4 % — ABNORMAL HIGH (ref 11.6–15.4)
WBC: 5.4 10*3/uL (ref 3.4–10.8)

## 2022-06-10 LAB — COMPREHENSIVE METABOLIC PANEL
ALT: 43 IU/L (ref 0–44)
AST: 35 IU/L (ref 0–40)
Albumin/Globulin Ratio: 1.3 (ref 1.2–2.2)
Albumin: 3.9 g/dL (ref 3.9–4.9)
Alkaline Phosphatase: 178 IU/L — ABNORMAL HIGH (ref 44–121)
BUN/Creatinine Ratio: 16 (ref 10–24)
BUN: 28 mg/dL — ABNORMAL HIGH (ref 8–27)
Bilirubin Total: <0.2 mg/dL (ref 0.0–1.2)
CO2: 16 mmol/L — ABNORMAL LOW (ref 20–29)
Calcium: 9.2 mg/dL (ref 8.6–10.2)
Chloride: 110 mmol/L — ABNORMAL HIGH (ref 96–106)
Creatinine, Ser: 1.75 mg/dL — ABNORMAL HIGH (ref 0.76–1.27)
Globulin, Total: 3.1 g/dL (ref 1.5–4.5)
Glucose: 198 mg/dL — ABNORMAL HIGH (ref 70–99)
Potassium: 3.8 mmol/L (ref 3.5–5.2)
Sodium: 143 mmol/L (ref 134–144)
Total Protein: 7 g/dL (ref 6.0–8.5)
eGFR: 43 mL/min/{1.73_m2} — ABNORMAL LOW (ref >59)

## 2022-06-10 MED ORDER — POLYSACCHARIDE IRON COMPLEX 150 MG PO CAPS: 150.0000 mg | ORAL_CAPSULE | Freq: Every day | ORAL | 1 refills | Status: DC

## 2022-06-10 MED ORDER — GLIPIZIDE 10 MG PO TABS: 10.0000 mg | ORAL_TABLET | Freq: Every day | ORAL | 1 refills | Status: DC

## 2022-06-10 NOTE — Addendum Note (Signed)
Addended by: Jon Billings on: 06/10/2022 07:50 AM   Modules accepted: Orders

## 2022-06-10 NOTE — Progress Notes (Signed)
Please let patient's wife know that his lab work looks good. His glucose is elevated. I am going to send the Glipizide to the pharmacy for him to restart.  Additionally, his anemia is worse so I have sent the iron to the pharmacy for him also.

## 2022-06-11 DIAGNOSIS — I110 Hypertensive heart disease with heart failure: Secondary | ICD-10-CM | POA: Diagnosis not present

## 2022-06-11 DIAGNOSIS — I5030 Unspecified diastolic (congestive) heart failure: Secondary | ICD-10-CM | POA: Diagnosis not present

## 2022-06-11 DIAGNOSIS — I69351 Hemiplegia and hemiparesis following cerebral infarction affecting right dominant side: Secondary | ICD-10-CM | POA: Diagnosis not present

## 2022-06-11 DIAGNOSIS — I639 Cerebral infarction, unspecified: Secondary | ICD-10-CM | POA: Diagnosis not present

## 2022-06-11 DIAGNOSIS — I69354 Hemiplegia and hemiparesis following cerebral infarction affecting left non-dominant side: Secondary | ICD-10-CM | POA: Diagnosis not present

## 2022-06-11 DIAGNOSIS — E1365 Other specified diabetes mellitus with hyperglycemia: Secondary | ICD-10-CM | POA: Diagnosis not present

## 2022-06-11 DIAGNOSIS — E7800 Pure hypercholesterolemia, unspecified: Secondary | ICD-10-CM | POA: Diagnosis not present

## 2022-06-11 DIAGNOSIS — Z7984 Long term (current) use of oral hypoglycemic drugs: Secondary | ICD-10-CM | POA: Diagnosis not present

## 2022-06-11 DIAGNOSIS — E1322 Other specified diabetes mellitus with diabetic chronic kidney disease: Secondary | ICD-10-CM | POA: Diagnosis not present

## 2022-06-11 DIAGNOSIS — I480 Paroxysmal atrial fibrillation: Secondary | ICD-10-CM | POA: Diagnosis not present

## 2022-06-11 DIAGNOSIS — Z9181 History of falling: Secondary | ICD-10-CM | POA: Diagnosis not present

## 2022-06-11 DIAGNOSIS — I6523 Occlusion and stenosis of bilateral carotid arteries: Secondary | ICD-10-CM | POA: Diagnosis not present

## 2022-06-11 DIAGNOSIS — N1831 Chronic kidney disease, stage 3a: Secondary | ICD-10-CM | POA: Diagnosis not present

## 2022-06-11 DIAGNOSIS — Z87891 Personal history of nicotine dependence: Secondary | ICD-10-CM | POA: Diagnosis not present

## 2022-06-11 DIAGNOSIS — I69322 Dysarthria following cerebral infarction: Secondary | ICD-10-CM | POA: Diagnosis not present

## 2022-06-11 DIAGNOSIS — Z7901 Long term (current) use of anticoagulants: Secondary | ICD-10-CM | POA: Diagnosis not present

## 2022-06-11 NOTE — Telephone Encounter (Signed)
Requested Prescriptions  Pending Prescriptions Disp Refills  . omeprazole (PRILOSEC) 40 MG capsule [Pharmacy Med Name: OMEPRAZOLE DR 40 MG CAP] 180 capsule 3    Sig: TAKE 1 CAPSULE BY MOUTH IN THE MORNING AND AT BEDTIME     Gastroenterology: Proton Pump Inhibitors Passed - 06/10/2022  2:13 PM      Passed - Valid encounter within last 12 months    Recent Outpatient Visits          2 days ago Recurrent strokes ([place])   [place], NP   1 month ago Recurrent strokes ([place])   [place], Erin E, PA-C   2 months ago Hemiplegia and hemiparesis following cerebral infarction affecting left non-dominant side ([place])   [place], Erin E, PA-C   4 months ago Pneumonia due to infectious organism, unspecified laterality, unspecified part of lung   [facility] Vigg, Avanti, MD   5 months ago Essential hypertension   [place] Vigg, Avanti, MD      Future Appointments            In 1 month Vickie Epley, MD [place]. [place]   In 2 months End, Harrell Gave, MD [place]. [facility]           . atorvastatin (LIPITOR) 80 MG tablet [Pharmacy Med Name: ATORVASTATIN CALCIUM 80 MG TAB] 90 tablet 1    Sig: TAKE 1 TABLET BY MOUTH EVERY EVENING     Cardiovascular:  Antilipid - Statins Failed - 06/10/2022  2:13 PM      Failed - Lipid Panel in normal range within the last 12 months    Cholesterol, Total  Date Value Ref Range Status  11/12/2021 134 100 - 199 mg/dL Final   Cholesterol  Date Value Ref Range Status  04/22/2022 128 0 - 200 mg/dL Final   LDL Cholesterol (Calc)  Date Value Ref Range Status  02/13/2020 65 mg/dL (calc) Final    Comment:    Reference range: <100 . Desirable range <100 mg/dL for primary prevention;   <70 mg/dL for patients with CHD or diabetic patients  with > or = 2 CHD risk  factors. Marland Kitchen LDL-C is now calculated using the Martin-Hopkins  calculation, which is a validated novel method providing  better accuracy than the Friedewald equation in the  estimation of LDL-C.  Cresenciano Genre et al. Annamaria Helling. 8250;539(76): 2061-2068  (http://education.[employer].com/faq/FAQ164)    LDL Chol Calc (NIH)  Date Value Ref Range Status  11/12/2021 52 0 - 99 mg/dL Final   LDL Cholesterol  Date Value Ref Range Status  04/22/2022 63 0 - 99 mg/dL Final    Comment:           Total Cholesterol/HDL:CHD Risk Coronary Heart Disease Risk Table                     Men   Women  1/2 Average Risk   3.4   3.3  Average Risk       5.0   4.4  2 X Average Risk   9.6   7.1  3 X Average Risk  23.4   11.0        Use the calculated Patient Ratio above and the CHD Risk Table to determine the patient's CHD Risk.        ATP III CLASSIFICATION (LDL):  <  100     mg/dL   Optimal  086-761  mg/dL   Near or Above                    Optimal  130-159  mg/dL   Borderline  950-932  mg/dL   High  >671     mg/dL   Very High Performed at [facility], [address] [place], [place] [address]    HDL  Date Value Ref Range Status  04/22/2022 47 >40 mg/dL Final  99/83/3825 53 >05 mg/dL Final   Triglycerides  Date Value Ref Range Status  04/22/2022 91 <150 mg/dL Final         Passed - Patient is not pregnant      Passed - Valid encounter within last 12 months    Recent Outpatient Visits          2 days ago Recurrent strokes (HCC)   [facility] Larae Grooms, NP   1 month ago Recurrent strokes (HCC)   [facility] Mecum, Erin E, PA-C   2 months ago Hemiplegia and hemiparesis following cerebral infarction affecting left non-dominant side (HCC)   [facility] Mecum, Erin E, PA-C   4 months ago Pneumonia due to infectious organism, unspecified laterality, unspecified part of lung   [facility] Vigg, Avanti, MD   5 months ago  Essential hypertension   [facility] Vigg, Chief Operating Officer, MD      Future Appointments            In 1 month Lanier Prude, MD [facility] [place]. [facility]   In 2 months End, Cristal Deer, MD [facility] Dept Of [place]. [facility]           . amLODipine (NORVASC) 10 MG tablet [Pharmacy Med Name: AMLODIPINE BESYLATE 10 MG TAB] 90 tablet 1    Sig: TAKE 1 TABLET BY MOUTH DAILY     Cardiovascular: Calcium Channel Blockers 2 Passed - 06/10/2022  2:13 PM      Passed - Last BP in normal range    BP Readings from Last 1 Encounters:  06/09/22 120/68         Passed - Last Heart Rate in normal range    Pulse Readings from Last 1 Encounters:  06/09/22 (!) 57         Passed - Valid encounter within last 6 months    Recent Outpatient Visits          2 days ago Recurrent strokes (HCC)   [facility] [place], Clydie Braun, NP   1 month ago Recurrent strokes (HCC)   [facility] Mecum, Erin E, PA-C   2 months ago Hemiplegia and hemiparesis following cerebral infarction affecting left non-dominant side (HCC)   [facility] Mecum, Erin E, PA-C   4 months ago Pneumonia due to infectious organism, unspecified laterality, unspecified part of lung   [facility] Vigg, Avanti, MD   5 months ago Essential hypertension   [facility] Vigg, Avanti, MD      Future Appointments            In 1 month Lalla Brothers, Rossie Muskrat, MD [facility] Dept Of [place]. [facility]   In 2 months End, Cristal Deer, MD [facility] A Dept Of [place]. [facility]           .  ELIQUIS 5 MG TABS tablet [Pharmacy Med Name: ELIQUIS 5 MG TAB] 180 tablet 3    Sig: TAKE 1 TABLET BY MOUTH TWICE A DAY     Hematology:  Anticoagulants - apixaban Failed - 06/10/2022  2:13 PM      Failed - HGB in normal range and within 360 days    Hemoglobin  Date  Value Ref Range Status  06/09/2022 9.7 (L) 13.0 - 17.7 g/dL Final         Failed - HCT in normal range and within 360 days    Hematocrit  Date Value Ref Range Status  06/09/2022 29.6 (L) 37.5 - 51.0 % Final         Failed - Cr in normal range and within 360 days    Creat  Date Value Ref Range Status  05/16/2020 1.38 (H) 0.70 - 1.25 mg/dL Final    Comment:    For patients >78 years of age, the reference limit for Creatinine is approximately 13% higher for people identified as African-American. .    Creatinine, Ser  Date Value Ref Range Status  06/09/2022 1.75 (H) 0.76 - 1.27 mg/dL Final   Creatinine, Urine  Date Value Ref Range Status  01/31/2019 96 20 - 320 mg/dL Final         Passed - PLT in normal range and within 360 days    Platelets  Date Value Ref Range Status  06/09/2022 230 150 - 450 x10E3/uL Final         Passed - AST in normal range and within 360 days    AST  Date Value Ref Range Status  06/09/2022 35 0 - 40 IU/L Final   SGOT(AST)  Date Value Ref Range Status  03/15/2013 11 (L) 15 - 37 Unit/L Final         Passed - ALT in normal range and within 360 days    ALT  Date Value Ref Range Status  06/09/2022 43 0 - 44 IU/L Final   SGPT (ALT)  Date Value Ref Range Status  03/15/2013 31 12 - 78 U/L Final         Passed - Valid encounter within last 12 months    Recent Outpatient Visits          2 days ago Recurrent strokes (HCC)   [facility] Larae Grooms, NP   1 month ago Recurrent strokes (HCC)   [facility] Mecum, Erin E, PA-C   2 months ago Hemiplegia and hemiparesis following cerebral infarction affecting left non-dominant side (HCC)   [facility] Mecum, Erin E, PA-C   4 months ago Pneumonia due to infectious organism, unspecified laterality, unspecified part of lung   [facility] Vigg, Avanti, MD   5 months ago Essential hypertension   [facility] Vigg, Chief Operating Officer, MD       Future Appointments            In 1 month Lalla Brothers, Rossie Muskrat, MD [facility] Dept Of [place]. [facility]   In 2 months End, Cristal Deer, MD [facility] Dept Of [place]. [facility]

## 2022-06-14 DIAGNOSIS — R531 Weakness: Secondary | ICD-10-CM | POA: Diagnosis not present

## 2022-06-16 ENCOUNTER — Encounter: Payer: Medicare Other | Admitting: Occupational Therapy

## 2022-06-16 DIAGNOSIS — I110 Hypertensive heart disease with heart failure: Secondary | ICD-10-CM | POA: Diagnosis not present

## 2022-06-16 DIAGNOSIS — I639 Cerebral infarction, unspecified: Secondary | ICD-10-CM | POA: Diagnosis not present

## 2022-06-16 DIAGNOSIS — E1365 Other specified diabetes mellitus with hyperglycemia: Secondary | ICD-10-CM | POA: Diagnosis not present

## 2022-06-16 DIAGNOSIS — E7800 Pure hypercholesterolemia, unspecified: Secondary | ICD-10-CM | POA: Diagnosis not present

## 2022-06-16 DIAGNOSIS — I69322 Dysarthria following cerebral infarction: Secondary | ICD-10-CM | POA: Diagnosis not present

## 2022-06-16 DIAGNOSIS — N1831 Chronic kidney disease, stage 3a: Secondary | ICD-10-CM | POA: Diagnosis not present

## 2022-06-16 DIAGNOSIS — Z7901 Long term (current) use of anticoagulants: Secondary | ICD-10-CM | POA: Diagnosis not present

## 2022-06-16 DIAGNOSIS — Z87891 Personal history of nicotine dependence: Secondary | ICD-10-CM | POA: Diagnosis not present

## 2022-06-16 DIAGNOSIS — I480 Paroxysmal atrial fibrillation: Secondary | ICD-10-CM | POA: Diagnosis not present

## 2022-06-16 DIAGNOSIS — E1322 Other specified diabetes mellitus with diabetic chronic kidney disease: Secondary | ICD-10-CM | POA: Diagnosis not present

## 2022-06-16 DIAGNOSIS — I5030 Unspecified diastolic (congestive) heart failure: Secondary | ICD-10-CM | POA: Diagnosis not present

## 2022-06-16 DIAGNOSIS — Z9181 History of falling: Secondary | ICD-10-CM | POA: Diagnosis not present

## 2022-06-16 DIAGNOSIS — I6523 Occlusion and stenosis of bilateral carotid arteries: Secondary | ICD-10-CM | POA: Diagnosis not present

## 2022-06-16 DIAGNOSIS — I69351 Hemiplegia and hemiparesis following cerebral infarction affecting right dominant side: Secondary | ICD-10-CM | POA: Diagnosis not present

## 2022-06-16 DIAGNOSIS — Z7984 Long term (current) use of oral hypoglycemic drugs: Secondary | ICD-10-CM | POA: Diagnosis not present

## 2022-06-16 DIAGNOSIS — I69354 Hemiplegia and hemiparesis following cerebral infarction affecting left non-dominant side: Secondary | ICD-10-CM | POA: Diagnosis not present

## 2022-06-17 ENCOUNTER — Encounter: Payer: Medicare Other | Admitting: Occupational Therapy

## 2022-06-19 DIAGNOSIS — I69354 Hemiplegia and hemiparesis following cerebral infarction affecting left non-dominant side: Secondary | ICD-10-CM | POA: Diagnosis not present

## 2022-06-19 DIAGNOSIS — I6523 Occlusion and stenosis of bilateral carotid arteries: Secondary | ICD-10-CM | POA: Diagnosis not present

## 2022-06-19 DIAGNOSIS — I69322 Dysarthria following cerebral infarction: Secondary | ICD-10-CM | POA: Diagnosis not present

## 2022-06-19 DIAGNOSIS — Z9181 History of falling: Secondary | ICD-10-CM | POA: Diagnosis not present

## 2022-06-19 DIAGNOSIS — Z7984 Long term (current) use of oral hypoglycemic drugs: Secondary | ICD-10-CM | POA: Diagnosis not present

## 2022-06-19 DIAGNOSIS — I69351 Hemiplegia and hemiparesis following cerebral infarction affecting right dominant side: Secondary | ICD-10-CM | POA: Diagnosis not present

## 2022-06-19 DIAGNOSIS — E1365 Other specified diabetes mellitus with hyperglycemia: Secondary | ICD-10-CM | POA: Diagnosis not present

## 2022-06-19 DIAGNOSIS — I480 Paroxysmal atrial fibrillation: Secondary | ICD-10-CM | POA: Diagnosis not present

## 2022-06-19 DIAGNOSIS — Z7901 Long term (current) use of anticoagulants: Secondary | ICD-10-CM | POA: Diagnosis not present

## 2022-06-19 DIAGNOSIS — Z87891 Personal history of nicotine dependence: Secondary | ICD-10-CM | POA: Diagnosis not present

## 2022-06-19 DIAGNOSIS — I5030 Unspecified diastolic (congestive) heart failure: Secondary | ICD-10-CM | POA: Diagnosis not present

## 2022-06-19 DIAGNOSIS — N1831 Chronic kidney disease, stage 3a: Secondary | ICD-10-CM | POA: Diagnosis not present

## 2022-06-19 DIAGNOSIS — I639 Cerebral infarction, unspecified: Secondary | ICD-10-CM | POA: Diagnosis not present

## 2022-06-19 DIAGNOSIS — E1322 Other specified diabetes mellitus with diabetic chronic kidney disease: Secondary | ICD-10-CM | POA: Diagnosis not present

## 2022-06-19 DIAGNOSIS — E7800 Pure hypercholesterolemia, unspecified: Secondary | ICD-10-CM | POA: Diagnosis not present

## 2022-06-19 DIAGNOSIS — I110 Hypertensive heart disease with heart failure: Secondary | ICD-10-CM | POA: Diagnosis not present

## 2022-06-22 ENCOUNTER — Emergency Department: Payer: Medicare Other

## 2022-06-22 ENCOUNTER — Observation Stay: Payer: Medicare Other

## 2022-06-22 ENCOUNTER — Observation Stay
Admission: EM | Admit: 2022-06-22 | Discharge: 2022-06-24 | Disposition: A | Discharge status: Home / Self Care | Payer: Medicare Other | Attending: Internal Medicine | Admitting: Internal Medicine

## 2022-06-22 ENCOUNTER — Other Ambulatory Visit: Payer: Self-pay

## 2022-06-22 DIAGNOSIS — I482 Chronic atrial fibrillation, unspecified: Secondary | ICD-10-CM | POA: Insufficient documentation

## 2022-06-22 DIAGNOSIS — I130 Hypertensive heart and chronic kidney disease with heart failure and stage 1 through stage 4 chronic kidney disease, or unspecified chronic kidney disease: Secondary | ICD-10-CM | POA: Insufficient documentation

## 2022-06-22 DIAGNOSIS — Z79899 Other long term (current) drug therapy: Secondary | ICD-10-CM | POA: Insufficient documentation

## 2022-06-22 DIAGNOSIS — I69354 Hemiplegia and hemiparesis following cerebral infarction affecting left non-dominant side: Secondary | ICD-10-CM | POA: Diagnosis not present

## 2022-06-22 DIAGNOSIS — Z7901 Long term (current) use of anticoagulants: Secondary | ICD-10-CM | POA: Diagnosis not present

## 2022-06-22 DIAGNOSIS — R001 Bradycardia, unspecified: Secondary | ICD-10-CM | POA: Diagnosis not present

## 2022-06-22 DIAGNOSIS — Z1152 Encounter for screening for COVID-19: Secondary | ICD-10-CM | POA: Diagnosis not present

## 2022-06-22 DIAGNOSIS — N1832 Chronic kidney disease, stage 3b: Secondary | ICD-10-CM | POA: Diagnosis present

## 2022-06-22 DIAGNOSIS — D509 Iron deficiency anemia, unspecified: Secondary | ICD-10-CM | POA: Insufficient documentation

## 2022-06-22 DIAGNOSIS — R2681 Unsteadiness on feet: Secondary | ICD-10-CM | POA: Diagnosis not present

## 2022-06-22 DIAGNOSIS — N179 Acute kidney failure, unspecified: Secondary | ICD-10-CM | POA: Insufficient documentation

## 2022-06-22 DIAGNOSIS — R1310 Dysphagia, unspecified: Secondary | ICD-10-CM | POA: Insufficient documentation

## 2022-06-22 DIAGNOSIS — E1122 Type 2 diabetes mellitus with diabetic chronic kidney disease: Secondary | ICD-10-CM | POA: Insufficient documentation

## 2022-06-22 DIAGNOSIS — E785 Hyperlipidemia, unspecified: Secondary | ICD-10-CM | POA: Diagnosis present

## 2022-06-22 DIAGNOSIS — R0602 Shortness of breath: Secondary | ICD-10-CM | POA: Diagnosis not present

## 2022-06-22 DIAGNOSIS — I10 Essential (primary) hypertension: Secondary | ICD-10-CM | POA: Diagnosis not present

## 2022-06-22 DIAGNOSIS — I639 Cerebral infarction, unspecified: Secondary | ICD-10-CM | POA: Diagnosis not present

## 2022-06-22 DIAGNOSIS — I6381 Other cerebral infarction due to occlusion or stenosis of small artery: Secondary | ICD-10-CM | POA: Diagnosis not present

## 2022-06-22 DIAGNOSIS — E162 Hypoglycemia, unspecified: Secondary | ICD-10-CM | POA: Diagnosis present

## 2022-06-22 DIAGNOSIS — E11649 Type 2 diabetes mellitus with hypoglycemia without coma: Principal | ICD-10-CM | POA: Insufficient documentation

## 2022-06-22 DIAGNOSIS — I4510 Unspecified right bundle-branch block: Secondary | ICD-10-CM | POA: Diagnosis not present

## 2022-06-22 DIAGNOSIS — R4182 Altered mental status, unspecified: Secondary | ICD-10-CM | POA: Diagnosis present

## 2022-06-22 DIAGNOSIS — M6281 Muscle weakness (generalized): Secondary | ICD-10-CM | POA: Diagnosis not present

## 2022-06-22 DIAGNOSIS — E1129 Type 2 diabetes mellitus with other diabetic kidney complication: Secondary | ICD-10-CM | POA: Diagnosis present

## 2022-06-22 DIAGNOSIS — I5032 Chronic diastolic (congestive) heart failure: Secondary | ICD-10-CM | POA: Diagnosis not present

## 2022-06-22 DIAGNOSIS — R0689 Other abnormalities of breathing: Secondary | ICD-10-CM | POA: Diagnosis not present

## 2022-06-22 DIAGNOSIS — N189 Chronic kidney disease, unspecified: Secondary | ICD-10-CM | POA: Diagnosis present

## 2022-06-22 DIAGNOSIS — R531 Weakness: Secondary | ICD-10-CM | POA: Diagnosis not present

## 2022-06-22 DIAGNOSIS — I959 Hypotension, unspecified: Secondary | ICD-10-CM | POA: Diagnosis not present

## 2022-06-22 DIAGNOSIS — G9341 Metabolic encephalopathy: Secondary | ICD-10-CM | POA: Diagnosis not present

## 2022-06-22 LAB — CBG MONITORING, ED
Glucose-Capillary: 153 mg/dL — ABNORMAL HIGH (ref 70–99)
Glucose-Capillary: 39 mg/dL — CL (ref 70–99)
Glucose-Capillary: 39 mg/dL — CL (ref 70–99)
Glucose-Capillary: 133 mg/dL — ABNORMAL HIGH (ref 70–99)
Glucose-Capillary: 105 mg/dL — ABNORMAL HIGH (ref 70–99)
Glucose-Capillary: 98 mg/dL (ref 70–99)
Glucose-Capillary: 115 mg/dL — ABNORMAL HIGH (ref 70–99)

## 2022-06-22 LAB — CBC
HCT: 27.2 % — ABNORMAL LOW (ref 39.0–52.0)
Hemoglobin: 8.9 g/dL — ABNORMAL LOW (ref 13.0–17.0)
MCH: 27.4 pg (ref 26.0–34.0)
MCHC: 32.7 g/dL (ref 30.0–36.0)
MCV: 83.7 fL (ref 80.0–100.0)
Platelets: 187 10*3/uL (ref 150–400)
RBC: 3.25 MIL/uL — ABNORMAL LOW (ref 4.22–5.81)
RDW: 19.8 % — ABNORMAL HIGH (ref 11.5–15.5)
WBC: 4 10*3/uL (ref 4.0–10.5)
nRBC: 0 % (ref 0.0–0.2)

## 2022-06-22 LAB — COMPREHENSIVE METABOLIC PANEL
ALT: 39 U/L (ref 0–44)
AST: 30 U/L (ref 15–41)
Albumin: 3.1 g/dL — ABNORMAL LOW (ref 3.5–5.0)
Alkaline Phosphatase: 134 U/L — ABNORMAL HIGH (ref 38–126)
Anion gap: 5 (ref 5–15)
BUN: 29 mg/dL — ABNORMAL HIGH (ref 8–23)
CO2: 22 mmol/L (ref 22–32)
Calcium: 8.5 mg/dL — ABNORMAL LOW (ref 8.9–10.3)
Chloride: 115 mmol/L — ABNORMAL HIGH (ref 98–111)
Creatinine, Ser: 1.9 mg/dL — ABNORMAL HIGH (ref 0.61–1.24)
GFR, Estimated: 39 mL/min — ABNORMAL LOW (ref >60)
Glucose, Bld: 54 mg/dL — ABNORMAL LOW (ref 70–99)
Potassium: 3.6 mmol/L (ref 3.5–5.1)
Sodium: 142 mmol/L (ref 135–145)
Total Bilirubin: 0.5 mg/dL (ref 0.3–1.2)
Total Protein: 7 g/dL (ref 6.5–8.1)

## 2022-06-22 LAB — TSH: TSH: 1.095 u[IU]/mL (ref 0.350–4.500)

## 2022-06-22 LAB — SARS CORONAVIRUS 2 BY RT PCR: SARS Coronavirus 2 by RT PCR: NEGATIVE

## 2022-06-22 LAB — PROTIME-INR
INR: 1.3 — ABNORMAL HIGH (ref 0.8–1.2)
Prothrombin Time: 15.7 seconds — ABNORMAL HIGH (ref 11.4–15.2)

## 2022-06-22 LAB — URINALYSIS, ROUTINE W REFLEX MICROSCOPIC
Bilirubin Urine: NEGATIVE
Glucose, UA: >=500 mg/dL — AB
Ketones, ur: NEGATIVE mg/dL
Leukocytes,Ua: NEGATIVE
Nitrite: NEGATIVE
Protein, ur: >=300 mg/dL — AB
Specific Gravity, Urine: 1.012 (ref 1.005–1.030)
pH: 5 (ref 5.0–8.0)

## 2022-06-22 LAB — BRAIN NATRIURETIC PEPTIDE: B Natriuretic Peptide: 168.2 pg/mL — ABNORMAL HIGH (ref 0.0–100.0)

## 2022-06-22 MED ORDER — ACETAMINOPHEN 325 MG PO TABS
650.0000 mg | ORAL_TABLET | Freq: Four times a day (QID) | ORAL | Status: DC | PRN
  Administered 2022-06-23 (×2): 650 mg via ORAL
  Filled 2022-06-22 (×2): qty 2

## 2022-06-22 MED ORDER — ATROPINE SULFATE 1 MG/10ML IJ SOSY: 1.0000 mg | PREFILLED_SYRINGE | INTRAMUSCULAR | Status: DC | PRN

## 2022-06-22 MED ORDER — MELATONIN 5 MG PO TABS: 2.5000 mg | ORAL_TABLET | Freq: Every evening | ORAL | Status: DC | PRN

## 2022-06-22 MED ORDER — DEXTROSE 50 % IV SOLN
INTRAVENOUS | Status: AC
  Administered 2022-06-22: 50 mL via INTRAVENOUS
  Filled 2022-06-22: qty 50

## 2022-06-22 MED ORDER — DEXTROSE 10 % IV SOLN: INTRAVENOUS | Status: DC

## 2022-06-22 MED ORDER — ALBUTEROL SULFATE (2.5 MG/3ML) 0.083% IN NEBU: 3.0000 mL | INHALATION_SOLUTION | RESPIRATORY_TRACT | Status: DC | PRN

## 2022-06-22 MED ORDER — POLYSACCHARIDE IRON COMPLEX 150 MG PO CAPS
150.0000 mg | ORAL_CAPSULE | Freq: Every day | ORAL | Status: DC
  Administered 2022-06-22 – 2022-06-24 (×3): 150 mg via ORAL
  Filled 2022-06-22 (×4): qty 1

## 2022-06-22 MED ORDER — DEXTROSE-NACL 5-0.9 % IV SOLN: INTRAVENOUS | Status: DC

## 2022-06-22 MED ORDER — STARCH (THICKENING) PO POWD: ORAL | Status: DC | PRN

## 2022-06-22 MED ORDER — SODIUM CHLORIDE 0.9 % IV BOLUS
1000.0000 mL | Freq: Once | INTRAVENOUS | Status: AC
  Administered 2022-06-22: 1000 mL via INTRAVENOUS

## 2022-06-22 MED ORDER — DEXTROSE 50 % IV SOLN: 1.0000 | Freq: Once | INTRAVENOUS | Status: AC

## 2022-06-22 MED ORDER — MIRTAZAPINE 15 MG PO TABS
7.5000 mg | ORAL_TABLET | Freq: Every day | ORAL | Status: DC
  Administered 2022-06-22 – 2022-06-23 (×2): 7.5 mg via ORAL
  Filled 2022-06-22 (×2): qty 1

## 2022-06-22 MED ORDER — APIXABAN 5 MG PO TABS
5.0000 mg | ORAL_TABLET | Freq: Two times a day (BID) | ORAL | Status: DC
  Administered 2022-06-22 – 2022-06-24 (×4): 5 mg via ORAL
  Filled 2022-06-22 (×4): qty 1

## 2022-06-22 MED ORDER — ONDANSETRON HCL 4 MG/2ML IJ SOLN: 4.0000 mg | Freq: Three times a day (TID) | INTRAMUSCULAR | Status: DC | PRN

## 2022-06-22 MED ORDER — ATORVASTATIN CALCIUM 80 MG PO TABS
80.0000 mg | ORAL_TABLET | Freq: Every evening | ORAL | Status: DC
  Administered 2022-06-22 – 2022-06-23 (×2): 80 mg via ORAL
  Filled 2022-06-22: qty 1
  Filled 2022-06-22: qty 4

## 2022-06-22 MED ORDER — DEXTROSE 10 % IV SOLN: 250.0000 mL | Freq: Once | INTRAVENOUS | Status: DC

## 2022-06-22 MED ORDER — DEXTROSE 50 % IV SOLN: 50.0000 mL | INTRAVENOUS | Status: DC | PRN

## 2022-06-22 MED ORDER — ACETAMINOPHEN 650 MG RE SUPP: 650.0000 mg | Freq: Four times a day (QID) | RECTAL | Status: DC | PRN

## 2022-06-22 MED ORDER — NITROGLYCERIN 0.4 MG SL SUBL: 0.4000 mg | SUBLINGUAL_TABLET | SUBLINGUAL | Status: DC | PRN

## 2022-06-22 MED ORDER — TAMSULOSIN HCL 0.4 MG PO CAPS
0.4000 mg | ORAL_CAPSULE | Freq: Every day | ORAL | Status: DC
  Administered 2022-06-23 – 2022-06-24 (×2): 0.4 mg via ORAL
  Filled 2022-06-22 (×2): qty 1

## 2022-06-22 MED ORDER — PANTOPRAZOLE SODIUM 40 MG PO TBEC
40.0000 mg | DELAYED_RELEASE_TABLET | Freq: Every day | ORAL | Status: DC
  Administered 2022-06-23 – 2022-06-24 (×2): 40 mg via ORAL
  Filled 2022-06-22 (×2): qty 1

## 2022-06-22 MED ORDER — HYDRALAZINE HCL 20 MG/ML IJ SOLN: 5.0000 mg | INTRAMUSCULAR | Status: DC | PRN

## 2022-06-22 NOTE — ED Notes (Addendum)
Pt BS 39 x2. MD Mumma aware. Pt given D50.

## 2022-06-22 NOTE — ED Notes (Signed)
Pt BS 153 MD Mumma aware. Going to hold D10 and start NS bolus. Will recheck BS in 30 minutes.

## 2022-06-22 NOTE — ED Notes (Signed)
Pt HR in 40s pt placed on pads. Crash cart placed in pt room. MD aware.

## 2022-06-22 NOTE — H&P (Signed)
History and Physical    Evan BangsDuncan E Hindle Sr. ZOX:[id number] DOB: [birth year] DOA: 06/22/2022  Referring MD/NP/PA:   PCP: Larae GroomsHoldsworth, Karen, NP   Patient coming from:  The patient is coming from home.    Chief Complaint: Generalized weakness    HPI: Evan AnoDuncan E Schram Sr. is a 66 y.o. male with medical history significant of multiple strokes with left-sided weakness, hypertension, hyperlipidemia, diabetes mellitus, PAF on Eliquis, diastolic CHF, iron deficiency anemia, CKD-3B, chronic bradycardia (heart rate as low as 30s-40s), who presents with generalized weakness.  Per her wife at the bedside, patient reports feeling weak since this morning.  Patient has multiple strokes in the past, with left-sided weakness, left facial droop and some difficulty speaking, which has no significant change.  Patient has shortness breath, no chest pain, cough, fever or chills.  Oxygen saturation is 98-100% on room air.  Patient had loose stool BM yesterday, but no active diarrhea, nausea vomiting, abdominal pain.  No symptoms of UTI.  No rectal bleeding or dark stool. Per his wife, patient is more confused than baseline.  Patient is lethargic, but easily arousable, oriented x3 when aroused state during the interview.  Patient was found to have hypoglycemia with blood sugar 39, was given D50 in ED.  Patient was found to have bradycardia with heart rate 30s-40s.  EMS gave 1 dose of atropine.  Currently heart rate 36-50s.  Data reviewed independently and ED Course: pt was found to have WBC 4.0, INR 1.3, PTT 54, temperature normal, blood pressure 139/74, heart rate 36-40s, RR 19, oxygen saturation 98-100% on room air currently.  Chest x-ray negative.  CT head negative.  Patient is placed in PCU for observation.   EKG: I have personally reviewed.  Sinus rhythm, bradycardia, heart rate of 49, QTc 496, right bundle blockage   Review of Systems:   General: no fevers, chills, no body weight gain, fatigue HEENT: no  blurry vision, hearing changes or sore throat Respiratory: has dyspnea, no coughing, wheezing CV: no chest pain, no palpitations GI: no nausea, vomiting, abdominal pain, diarrhea, constipation GU: no dysuria, burning on urination, increased urinary frequency, hematuria  Ext: no leg edema Neuro: no vision change or hearing loss. Has lethargy, confusion.  Has left-sided weakness from the stroke Skin: no rash, no skin tear. MSK: No muscle spasm, no deformity, no limitation of range of movement in spin Heme: No easy bruising.  Travel history: No recent long distant travel.   Allergy: No Known Allergies  Past Medical History:  Diagnosis Date   Acute CVA (cerebrovascular accident) (HCC) 06/05/2021   Acute ischemic stroke (HCC) 04/22/2022   CAP (community acquired pneumonia) 11/26/2021   Carotid arterial disease (HCC)    a. 08/2018 Carotid U/S: min-mod RICA atherosclerosis w/o hemodynamically significant stenosis. Nl LICA.   Diabetes 1.5, managed as type 2 (HCC)    Diastolic dysfunction    a. 08/2018 Echo: EF 65%. No rwma. Gr1 DD. Mild MR.   Diastolic dysfunction    a. 08/2019 Echo: EF 55-60%, Gr1 DD. No rwma. Mild MR. RVSP 37.456mmHg.   Hypercholesterolemia    Hypertension    PAF (paroxysmal atrial fibrillation) (HCC)    a. 10/2019 Event Monitor: PAF; b. CHA2DS2VASc = 5-->Eliquis.   Poorly controlled diabetes mellitus (HCC)    a. 04/2019 A1c 13.8.   Recurrent strokes (HCC)    a. 10/2016 MRI/A: Acute 5mm R thalamic infarct, ? subacute infarct of R corona radiata; b. 08/2017 MRI/A: Acute 8mm lateral L thalamic infarct. Other more  remote lacunar infarcts of thalami bilat. Small vessel dzs; c. 08/2018 MRI/A: Acute lacunar infarct of the post limb of R internal capsule; d. 08/2019 MRI Acute CVA of L paramedian pons adn R cerebellar hemisphere.   Sepsis due to pneumonia (HCC) 11/26/2021   Tobacco abuse     Past Surgical History:  Procedure Laterality Date   ESOPHAGOGASTRODUODENOSCOPY (EGD) WITH  PROPOFOL N/A 04/26/2019   Procedure: ESOPHAGOGASTRODUODENOSCOPY (EGD) WITH PROPOFOL;  Surgeon: Toney Reil, MD;  Location: [facility] ENDOSCOPY;  Service: Gastroenterology;  Laterality: N/A;   LEFT HEART CATH AND CORONARY ANGIOGRAPHY N/A 01/09/2022   Procedure: LEFT HEART CATH AND CORONARY ANGIOGRAPHY;  Surgeon: [name], MD;  Location: [facility] INVASIVE CV LAB;  Service: Cardiovascular;  Laterality: N/A;   NO PAST SURGERIES     TEE WITHOUT CARDIOVERSION N/A 04/22/2022   Procedure: TRANSESOPHAGEAL ECHOCARDIOGRAM (TEE);  Surgeon: Debbe Odea, MD;  Location: [facility] ORS;  Service: Cardiovascular;  Laterality: N/A;    Social History:  reports that he has quit smoking. His smoking use included cigarettes. He started smoking about 41 years ago. He has a 38.00 pack-year smoking history. He has never used smokeless tobacco. He reports that he does not currently use alcohol after a past usage of about 1.0 standard drink of alcohol per week. He reports that he does not currently use drugs after having used the following drugs: Cocaine and Marijuana.  Family History:  Family History  Problem Relation Age of Onset   Hypertension Mother    Stroke Mother        died @ age 73   Hypertension Father    Diabetes Father    Heart attack Father        died @ 74     Prior to Admission medications   Medication Sig Start Date End Date Taking? Authorizing Provider  amLODipine (NORVASC) 10 MG tablet TAKE 1 TABLET BY MOUTH DAILY 06/11/22  Yes Larae Grooms, NP  atorvastatin (LIPITOR) 80 MG tablet TAKE 1 TABLET BY MOUTH EVERY EVENING 06/11/22  Yes Larae Grooms, NP  dapagliflozin propanediol (FARXIGA) 10 MG TABS tablet Take 1 tablet (10 mg total) by mouth daily before breakfast. In place of glipizide 05/22/22  Yes Furth, Cadence H, PA-C  ELIQUIS 5 MG TABS tablet TAKE 1 TABLET BY MOUTH TWICE A DAY 06/11/22  Yes Larae Grooms, NP  glipiZIDE (GLUCOTROL) 10 MG tablet Take 1 tablet (10 mg total) by  mouth daily before breakfast. 06/10/22  Yes Larae Grooms, NP  iron polysaccharides (NIFEREX) 150 MG capsule Take 1 capsule (150 mg total) by mouth daily. 06/10/22  Yes Larae Grooms, NP  losartan (COZAAR) 25 MG tablet Take 1 tablet (25 mg total) by mouth daily. 04/23/22  Yes Esaw Grandchild A, DO  mirtazapine (REMERON) 7.5 MG tablet Take 1 tablet (7.5 mg total) by mouth at bedtime. 05/07/22  Yes Mecum, Erin E, PA-C  omeprazole (PRILOSEC) 40 MG capsule TAKE 1 CAPSULE BY MOUTH IN THE MORNING AND AT BEDTIME 06/11/22  Yes Larae Grooms, NP  tamsulosin (FLOMAX) 0.4 MG CAPS capsule Take 1 capsule (0.4 mg total) by mouth daily. 02/25/22  Yes Sondra Come, MD  albuterol (VENTOLIN HFA) 108 (90 Base) MCG/ACT inhaler Inhale 2 puffs into the lungs every 6 (six) hours as needed for wheezing or shortness of breath. 05/18/22   Larae Grooms, NP  melatonin 1 MG TABS tablet Take 3 tablets (3 mg total) by mouth at bedtime as needed. 04/23/22   Pennie Banter, DO  metoCLOPramide (REGLAN)  5 MG tablet TAKE 1 TABLET BY MOUTH 3 TIMES DAILY BEFORE MEALS Patient not taking: Reported on 06/09/2022 02/03/22   Charlynne Cousins, MD  nitroGLYCERIN (NITROSTAT) 0.4 MG SL tablet Place 1 tablet (0.4 mg total) under the tongue every 5 (five) minutes as needed for chest pain. 09/16/21   Emeterio Reeve, DO    Physical Exam: Vitals:   06/22/22 1345 06/22/22 1400 06/22/22 1430 06/22/22 1458  BP:  (!) 142/66 123/70   Pulse: (!) 40 (!) 41 (!) 41   Resp: 17 10 11    Temp:    98.4 F (36.9 C)  TempSrc:    Oral  SpO2: 100% 100% 100%   Weight:      Height:       General: Not in acute distress.  Dry mucous membrane HEENT:       Eyes: PERRL, EOMI, no scleral icterus.       ENT: No discharge from the ears and nose, no pharynx injection, no tonsillar enlargement.        Neck: No JVD, no bruit, no mass felt. Heme: No neck lymph node enlargement. Cardiac: S1/S2, RRR, No murmurs, No gallops or rubs. Respiratory: No  rales, wheezing, rhonchi or rubs. GI: Soft, nondistended, nontender, no rebound pain, no organomegaly, BS present. GU: No hematuria Ext: No pitting leg edema bilaterally. 1+DP/PT pulse bilaterally. Musculoskeletal: No joint deformities, No joint redness or warmth, no limitation of ROM in spin. Skin: No rashes.  Neuro: Lethargic, easily arousable, when aroused patient is oriented X3, following commands.  Cranial nerves II-XII grossly intact, has left-sided weakness from previous stroke Psych: Patient is not psychotic, no suicidal or hemocidal ideation.  Labs on Admission: I have personally reviewed following labs and imaging studies  CBC: Recent Labs  Lab 06/22/22 1012  WBC 4.0  HGB 8.9*  HCT 27.2*  MCV 83.7  PLT 124   Basic Metabolic Panel: Recent Labs  Lab 06/22/22 1012  NA 142  K 3.6  CL 115*  CO2 22  GLUCOSE 54*  BUN 29*  CREATININE 1.90*  CALCIUM 8.5*   GFR: Estimated Creatinine Clearance: 35 mL/min (A) (by C-G formula based on SCr of 1.9 mg/dL (H)). Liver Function Tests: Recent Labs  Lab 06/22/22 1012  AST 30  ALT 39  ALKPHOS 134*  BILITOT 0.5  PROT 7.0  ALBUMIN 3.1*   No results for input(s): "LIPASE", "AMYLASE" in the last 168 hours. No results for input(s): "AMMONIA" in the last 168 hours. Coagulation Profile: Recent Labs  Lab 06/22/22 1012  INR 1.3*   Cardiac Enzymes: No results for input(s): "CKTOTAL", "CKMB", "CKMBINDEX", "TROPONINI" in the last 168 hours. BNP (last 3 results) No results for input(s): "PROBNP" in the last 8760 hours. HbA1C: No results for input(s): "HGBA1C" in the last 72 hours. CBG: Recent Labs  Lab 06/22/22 1101 06/22/22 1127 06/22/22 1158 06/22/22 1307 06/22/22 1347  GLUCAP 39* 153* 133* 105* 98   Lipid Profile: No results for input(s): "CHOL", "HDL", "LDLCALC", "TRIG", "CHOLHDL", "LDLDIRECT" in the last 72 hours. Thyroid Function Tests: Recent Labs    06/22/22 1012  TSH 1.095   Anemia Panel: No results for  input(s): "VITAMINB12", "FOLATE", "FERRITIN", "TIBC", "IRON", "RETICCTPCT" in the last 72 hours. Urine analysis:    Component Value Date/Time   COLORURINE YELLOW (A) 06/22/2022 1722   APPEARANCEUR HAZY (A) 06/22/2022 1722   APPEARANCEUR Clear 12/18/2021 1332   LABSPEC 1.012 06/22/2022 1722   PHURINE 5.0 06/22/2022 1722   GLUCOSEU >=500 (A) 06/22/2022 1722  HGBUR SMALL (A) 06/22/2022 1722   BILIRUBINUR NEGATIVE 06/22/2022 1722   BILIRUBINUR Negative 12/18/2021 1332   KETONESUR NEGATIVE 06/22/2022 1722   PROTEINUR >=300 (A) 06/22/2022 1722   NITRITE NEGATIVE 06/22/2022 1722   LEUKOCYTESUR NEGATIVE 06/22/2022 1722   Sepsis Labs: @LABRCNTIP (procalcitonin:4,lacticidven:4) ) Recent Results (from the past 240 hour(s))  SARS Coronavirus 2 by RT PCR (hospital order, performed in [facility] hospital lab) *cepheid single result test* Anterior Nasal Swab     Status: None   Collection Time: 06/22/22  3:03 PM   Specimen: Anterior Nasal Swab  Result Value Ref Range Status   SARS Coronavirus 2 by RT PCR NEGATIVE NEGATIVE Final    Comment: (NOTE) SARS-CoV-2 target nucleic acids are NOT DETECTED.  The SARS-CoV-2 RNA is generally detectable in upper and lower respiratory specimens during the acute phase of infection. The lowest concentration of SARS-CoV-2 viral copies this assay can detect is 250 copies / mL. A negative result does not preclude SARS-CoV-2 infection and should not be used as the sole basis for treatment or other patient management decisions.  A negative result may occur with improper specimen collection / handling, submission of specimen other than nasopharyngeal swab, presence of viral mutation(s) within the areas targeted by this assay, and inadequate number of viral copies (<250 copies / mL). A negative result must be combined with clinical observations, patient history, and epidemiological information.  Fact Sheet for Patients:    13/06/23  Fact Sheet for Healthcare Providers: RoadLapTop.co.za  This test is not yet approved or  cleared by the http://kim-miller.com/ FDA and has been authorized for detection and/or diagnosis of SARS-CoV-2 by FDA under an Emergency Use Authorization (EUA).  This EUA will remain in effect (meaning this test can be used) for the duration of the COVID-19 declaration under Section 564(b)(1) of the Act, 21 U.S.C. section 360bbb-3(b)(1), unless the authorization is terminated or revoked sooner.  Performed at [facility], [address] [place], [place]      Radiological Exams on Admission: DG Chest [facility] 1 View  Result Date: 06/22/2022 CLINICAL DATA:  Short of breath EXAM: PORTABLE CHEST 1 VIEW COMPARISON:  None Available. FINDINGS: Normal mediastinum and cardiac silhouette. Normal pulmonary vasculature. No evidence of effusion, infiltrate, or pneumothorax. No acute bony abnormality. IMPRESSION: No acute cardiopulmonary process. Electronically Signed   By: 13/01/2022 M.D.   On: 06/22/2022 13:06   CT Head Wo Contrast  Result Date: 06/22/2022 CLINICAL DATA:  Provided history: Mental status change, unknown cause. History of stroke. Left-sided weakness, facial droop, slurred speech. Increased generalized weakness. EXAM: CT HEAD WITHOUT CONTRAST TECHNIQUE: Contiguous axial images were obtained from the base of the skull through the vertex without intravenous contrast. RADIATION DOSE REDUCTION: This exam was performed according to the departmental dose-optimization program which includes automated exposure control, adjustment of the mA and/or kV according to patient size and/or use of iterative reconstruction technique. COMPARISON:  Brain MRI 04/21/2022. Head CT 04/21/2022. FINDINGS: Brain: No age advanced or lobar predominant parenchymal atrophy. Chronic small-vessel infarcts within the right centrum semiovale, bilateral  corona radiata and bilateral deep gray nuclei. Chronic lacunar infarcts within the pons. Small chronic infarcts within the bilateral cerebellar hemispheres. Some of these infarcts were better appreciated on the prior brain MRI 04/21/2022. Background mild patchy and ill-defined hypoattenuation within the cerebral white matter, nonspecific but compatible with chronic small vessel ischemic disease. There is no acute intracranial hemorrhage. No demarcated cortical infarct. No extra-axial fluid collection. No evidence of an  intracranial mass. No midline shift. Vascular: No hyperdense vessel. Skull: No fracture or aggressive osseous lesion. Sinuses/Orbits: No mass or acute finding within the imaged orbits. Mild mucosal thickening, and small mucous retention cyst, within the right maxillary sinus. Mild mucosal thickening, and small-volume frothy secretions, within the left maxillary sinus. Mild mucosal thickening within the bilateral anterior ethmoid air cells and frontoethmoidal recesses. IMPRESSION: 1. No evidence of acute intracranial abnormality. 2. Multiple known chronic infarcts within the supratentorial and infratentorial brain, as detailed. 3. Paranasal sinus disease at the imaged levels, as described. Electronically Signed   By: Jackey Loge D.O.   On: 06/22/2022 11:58      Assessment/Plan Active Problems:   Hypoglycemia   Bradycardia   Acute metabolic encephalopathy   Chronic diastolic CHF (congestive heart failure) (HCC)   Hyperlipidemia LDL goal <70   Essential hypertension   Atrial fibrillation, chronic (HCC)   Stroke (HCC)   Chronic kidney disease, stage 3b (HCC)   Iron deficiency anemia   Type II diabetes mellitus with renal manifestations (HCC)   Assessment and Plan:  Hypoglycemia: Blood sugar down to 39.  Likely due to decreased oral intake and continuation of glipizide use. -Placed on PCU for observation -Check CBG every hour -D50 as needed -D5-normal saline at 75 cc/h -Hold  glipizide and Farxiga  Bradycardia: Sinus bradycardia.  This is chronic issue.  Chart review showed that patient had heart rate 30s-40s in the past.  Currently heart rate 36-40s.  Hemodynamically stable.  Currently blood pressure 139/74. EDP discussed with Dr. Kirke Corin of cardiology, who recommended to treat patient's hypoglycemia now.  Since this is a chronic issue, if patient gets worse, may need to call back to cardiology. -Avoid using nodal blockers -Telemetry monitoring -As needed atropine for symptomatic bradycardia  Acute metabolic encephalopathy: Likely due to hypoglycemia.  CT head negative. -Frequent neurochecks  Chronic diastolic CHF (congestive heart failure) (HCC): 2D echo on 04/22/2022 showed EF of 55 to 60%.  Patient does not have leg edema or JVD.  No pulm edema by chest x-ray.  BNP 168.  CHF seem to be compensated. -Watch volume status closely  Hyperlipidemia LDL goal <70 -Lipitor  Essential hypertension -IV hydralazine as needed -Hold amlodipine and Cozaar due to bradycardia and the risk of developing hypotension  Atrial fibrillation, chronic (HCC) -Eliquis  Stroke [facility]): -Patient is on Lipitor and Eliquis  Chronic kidney disease, stage 3b [facility]): Renal function slightly worsening than baseline.  Recent baseline creatinine 1.4-1.8.  His creatinine is 1.90, BUN 29, GFR 39. -Followed by BMP -Cozaar is on hold -IV fluid as above  Iron deficiency anemia: Hemoglobin 8.9 (9.7 on 06/09/2022), no rectal bleeding or dark stool. 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  Patient is taking glipizide and [place].  Now has hypoglycemia -Hold glipizide and [place].      DVT ppx: on Eliquis  Code Status: Full code  Family Communication:  Yes, patient's  wife  at bed side.    Disposition Plan:  Anticipate discharge back to previous environment  Consults called:  none  Admission status and Level of  care: Progressive:     for obs      Dispo: The patient is from: Home              Anticipated d/c is to: Home              Anticipated d/c date is: 1 day  Patient currently is not medically stable to d/c.    Severity of Illness:  The appropriate patient status for this patient is INPATIENT. Inpatient status is judged to be reasonable and necessary in order to provide the required intensity of service to ensure the patient's safety. The patient's presenting symptoms, physical exam findings, and initial radiographic and laboratory data in the context of their chronic comorbidities is felt to place them at high risk for further clinical deterioration. Furthermore, it is not anticipated that the patient will be medically stable for discharge from the hospital within 2 midnights of admission.   * I certify that at the point of admission it is my clinical judgment that the patient will require inpatient hospital care spanning beyond 2 midnights from the point of admission due to high intensity of service, high risk for further deterioration and high frequency of surveillance required.*       Date of Service 06/22/2022    Lorretta Harp [facility]   If 7PM-7AM, please contact night-coverage [email or web address] 06/22/2022, 6:06 PM

## 2022-06-22 NOTE — ED Notes (Signed)
Pt given water to perform swallow screen and pt was successful at swallowing without any issues.

## 2022-06-22 NOTE — ED Provider Notes (Signed)
[facility] Provider Note    Event Date/Time   First MD Initiated Contact with Patient 06/22/22 [phone]     (approximate)   History   Altered Mental Status and Bradycardia   HPI  Evan LUERS Sr. is a 66 y.o. male with past medical history significant for diabetes, prior CVA with multiple residual deficits, who presents to the emergency department with altered mental status and worsening weakness.  History is provided by EMS with his wife who is his caregiver at bedside.  States that this morning she had worsening problems trying to get him up and moving.  States that she is his caregiver and is no longer able to care for him with his assistance.  States today that he had worsening weakness and was unable to stand.  Does state that he has been taking his diabetic medication and did not feel like eating today so he did not have any food.  Has a history of bradycardia.  States he is just more confused than his normal.  No known new falls.  States he was complaining of chest pain earlier today.     Physical Exam   Triage Vital Signs: ED Triage Vitals  Enc Vitals Group     BP 06/22/22 0956 (!) 141/77     Pulse Rate 06/22/22 0956 (!) 43     Resp 06/22/22 0956 13     Temp 06/22/22 0956 98.7 F (37.1 C)     Temp src --      SpO2 06/22/22 0956 98 %     Weight 06/22/22 0959 151 lb 14.4 oz (68.9 kg)     Height 06/22/22 0959 5\' 6"  (1.676 m)     Head Circumference --      Peak Flow --      Pain Score --      Pain Loc --      Pain Edu? --      Excl. in [place]? --     Most recent vital signs: Vitals:   06/22/22 1430 06/22/22 1458  BP: 123/70   Pulse: (!) 41   Resp: 11   Temp:  98.4 F (36.9 C)  SpO2: 100%     Physical Exam Constitutional:      Appearance: He is well-developed.  HENT:     Head: Atraumatic.  Eyes:     Conjunctiva/sclera: Conjunctivae normal.  Cardiovascular:     Rate and Rhythm: Bradycardia present.  Pulmonary:     Effort: No  respiratory distress.     Breath sounds: No wheezing.  Musculoskeletal:     Cervical back: Normal range of motion.  Skin:    General: Skin is warm.  Neurological:     Mental Status: He is alert. Mental status is at baseline.     Comments: Contractures, following simple commands.  At baseline according to his wife at bedside.          IMPRESSION / MDM / ASSESSMENT AND PLAN / ED COURSE  I reviewed the triage vital signs and the nursing notes.  Differential diagnosis including intracranial hemorrhage, metabolic encephalopathy, symptomatic bradycardia, ACS, electrolyte abnormality, infectious process  On chart review patient has history of chronic bradycardia, evaluated by cardiology and EP in the past.  Patient found to have significant blood glucose in the 30s.  Patient given 1 amp of D10 with improvement of his glucose.  Ordered a meal tray and now tolerating p.o.  Initially ordered D10 infusion however shelves given that  the patient was able to p.o. without any difficulties.  Given 1 L of IV fluids.    EKG  My interpretation of the EKG -sinus bradycardia normal intervals.  No chamber enlargement.  No significant ST elevation or depression.  No signs of acute ischemia or dysrhythmia.  No significant change when compared to prior EKG  Sinus bradycardia while on cardiac telemetry.  RADIOLOGY I independently reviewed imaging, my interpretation of imaging: Chest x-ray with no focal findings consistent with pneumonia.  CT scan of the head with no acute findings.  Read as no acute findings    ED Results / Procedures / Treatments   Labs (all labs ordered are listed, but only abnormal results are displayed) Labs interpreted as -    Labs Reviewed  COMPREHENSIVE METABOLIC PANEL - Abnormal; Notable for the following components:      Result Value   Chloride 115 (*)    Glucose, Bld 54 (*)    BUN 29 (*)    Creatinine, Ser 1.90 (*)    Calcium 8.5 (*)    Albumin 3.1 (*)     Alkaline Phosphatase 134 (*)    GFR, Estimated 39 (*)    All other components within normal limits  CBC - Abnormal; Notable for the following components:   RBC 3.25 (*)    Hemoglobin 8.9 (*)    HCT 27.2 (*)    RDW 19.8 (*)    All other components within normal limits  PROTIME-INR - Abnormal; Notable for the following components:   Prothrombin Time 15.7 (*)    INR 1.3 (*)    All other components within normal limits  BRAIN NATRIURETIC PEPTIDE - Abnormal; Notable for the following components:   B Natriuretic Peptide 168.2 (*)    All other components within normal limits  CBG MONITORING, ED - Abnormal; Notable for the following components:   Glucose-Capillary 39 (*)    All other components within normal limits  CBG MONITORING, ED - Abnormal; Notable for the following components:   Glucose-Capillary 39 (*)    All other components within normal limits  CBG MONITORING, ED - Abnormal; Notable for the following components:   Glucose-Capillary 153 (*)    All other components within normal limits  CBG MONITORING, ED - Abnormal; Notable for the following components:   Glucose-Capillary 133 (*)    All other components within normal limits  CBG MONITORING, ED - Abnormal; Notable for the following components:   Glucose-Capillary 105 (*)    All other components within normal limits  SARS CORONAVIRUS 2 BY RT PCR  TSH  URINALYSIS, ROUTINE W REFLEX MICROSCOPIC  CBG MONITORING, ED     Repeat glucose improved tolerating p.o.  Consulted cardiology for the patient's bradycardia and discussed with Dr. Fletcher Anon who recommended admission to the hospitalist for his hyperglycemia and for the hospitalist to reconsult if he was having any further issues chronic bradycardia.  Consulted the hospitalist for admission for altered mental status and hypoglycemia.  PROCEDURES:  Critical Care performed: No  .Critical Care  Performed by: Nathaniel Man, MD Authorized by: Nathaniel Man, MD   Critical care  provider statement:    Critical care time (minutes):  35   Critical care time was exclusive of:  Separately billable procedures and treating other patients   Critical care was necessary to treat or prevent imminent or life-threatening deterioration of the following conditions:  Endocrine crisis   Critical care was time spent personally by me on the following activities:  Development  of treatment plan with patient or surrogate, discussions with consultants, evaluation of patient's response to treatment, examination of patient, ordering and review of laboratory studies, ordering and review of radiographic studies, ordering and performing treatments and interventions, pulse oximetry, re-evaluation of patient's condition and review of old charts   Patient's presentation is most consistent with acute presentation with potential threat to life or bodily function.   MEDICATIONS ORDERED IN ED: Medications  dextrose 50 % solution 50 mL (has no administration in time range)  albuterol (PROVENTIL) (2.5 MG/3ML) 0.083% nebulizer solution 3 mL (has no administration in time range)  ondansetron (ZOFRAN) injection 4 mg (has no administration in time range)  acetaminophen (TYLENOL) tablet 650 mg (has no administration in time range)  acetaminophen (TYLENOL) suppository 650 mg (has no administration in time range)  hydrALAZINE (APRESOLINE) injection 5 mg (has no administration in time range)  atorvastatin (LIPITOR) tablet 80 mg (has no administration in time range)  nitroGLYCERIN (NITROSTAT) SL tablet 0.4 mg (has no administration in time range)  mirtazapine (REMERON) tablet 7.5 mg (has no administration in time range)  pantoprazole (PROTONIX) EC tablet 40 mg (has no administration in time range)  tamsulosin (FLOMAX) capsule 0.4 mg (has no administration in time range)  apixaban (ELIQUIS) tablet 5 mg (has no administration in time range)  iron polysaccharides (NIFEREX) capsule 150 mg (150 mg Oral Given 06/22/22  1343)  melatonin tablet 2.5 mg (has no administration in time range)  food thickener (THICK IT) powder (has no administration in time range)  atropine 1 MG/10ML injection 1 mg (has no administration in time range)  dextrose 5 %-0.9 % sodium chloride infusion ( Intravenous New Bag/Given 06/22/22 1431)  dextrose 50 % solution 50 mL (50 mLs Intravenous Given 06/22/22 1102)  sodium chloride 0.9 % bolus 1,000 mL (0 mLs Intravenous Stopped 06/22/22 1350)    FINAL CLINICAL IMPRESSION(S) / ED DIAGNOSES   Final diagnoses:  Altered mental status, unspecified altered mental status type  Hypoglycemia  Bradycardia     Rx / DC Orders   ED Discharge Orders     None        Note:  This document was prepared using Dragon voice recognition software and may include unintentional dictation errors.   Nathaniel Man, MD 06/22/22 1622

## 2022-06-22 NOTE — ED Triage Notes (Addendum)
Arrived [phone] via [facility]. Wife called because he was acting and feeling worse than usual. Pt has hx of stroke. Pt was baseline for L sided weakness, facial droop, and slurred speech. Wife reports increased generalized weakness.  Also experiencing bradycardia. EMS gave atropine at 0938 heart rate went from 40 to 55. BP was stable entire time. Last know well was 2000 when wife put pt to bed. Wife also states pt cheek bones and lips look swollen.

## 2022-06-22 NOTE — Inpatient Diabetes Management (Signed)
Inpatient Diabetes Program Recommendations  AACE/ADA: New Consensus Statement on Inpatient Glycemic Control (2015)  Target Ranges:  Prepandial:   less than 140 mg/dL      Peak postprandial:   less than 180 mg/dL (1-2 hours)      Critically ill patients:  140 - 180 mg/dL   Lab Results  Component Value Date   GLUCAP 153 (H) 06/22/2022   HGBA1C 7.0 (H) 04/21/2022    Review of Glycemic Control  Diabetes history: DM2 Outpatient Diabetes medications: Glucotrol 10 mg qd, Farxiga 10 mg qd Current orders for Inpatient glycemic control: None  Inpatient Diabetes Program Recommendations:   Patient currently in ED with current CBG 153. Spoke with wife regarding home CBGs and wife gives patient his medications. States some mornings patient blood glucose has been as low and 48 and wife continued medications and gave patient "something sweet". Patient ate little over the past weekend due to poor appetite but continued medications as prescribed. Reviewed hypoglycemia protocol and requested wife and patient to discuss medications with MD. Please consider: -Discharge instructions to include hold Farxiga and Glucotrol if patient unable to eat and drink and if CBG < 100-120 fasting, discontinue Glucotrol.  Thank you, Nani Gasser. John Vasconcelos, RN, MSN, CDE  Diabetes Coordinator Inpatient Glycemic Control Team Team Pager [phone] (8am-5pm) 06/22/2022 11:57 AM

## 2022-06-22 NOTE — ED Notes (Signed)
Pt return from CT scan.

## 2022-06-22 NOTE — ED Notes (Signed)
Pt went to CT scan with Jarrett Soho, RN.

## 2022-06-23 ENCOUNTER — Encounter: Payer: Self-pay | Admitting: Internal Medicine

## 2022-06-23 ENCOUNTER — Encounter: Payer: Medicare Other | Admitting: Occupational Therapy

## 2022-06-23 DIAGNOSIS — I482 Chronic atrial fibrillation, unspecified: Secondary | ICD-10-CM | POA: Diagnosis not present

## 2022-06-23 DIAGNOSIS — R001 Bradycardia, unspecified: Secondary | ICD-10-CM | POA: Diagnosis not present

## 2022-06-23 DIAGNOSIS — G9341 Metabolic encephalopathy: Secondary | ICD-10-CM | POA: Diagnosis not present

## 2022-06-23 DIAGNOSIS — E162 Hypoglycemia, unspecified: Secondary | ICD-10-CM | POA: Diagnosis not present

## 2022-06-23 DIAGNOSIS — R4182 Altered mental status, unspecified: Secondary | ICD-10-CM | POA: Diagnosis not present

## 2022-06-23 DIAGNOSIS — E11649 Type 2 diabetes mellitus with hypoglycemia without coma: Secondary | ICD-10-CM | POA: Diagnosis not present

## 2022-06-23 DIAGNOSIS — N179 Acute kidney failure, unspecified: Secondary | ICD-10-CM | POA: Diagnosis present

## 2022-06-23 DIAGNOSIS — I10 Essential (primary) hypertension: Secondary | ICD-10-CM | POA: Diagnosis not present

## 2022-06-23 LAB — BASIC METABOLIC PANEL
Anion gap: 3 — ABNORMAL LOW (ref 5–15)
BUN: 24 mg/dL — ABNORMAL HIGH (ref 8–23)
CO2: 21 mmol/L — ABNORMAL LOW (ref 22–32)
Calcium: 8.2 mg/dL — ABNORMAL LOW (ref 8.9–10.3)
Chloride: 119 mmol/L — ABNORMAL HIGH (ref 98–111)
Creatinine, Ser: 1.68 mg/dL — ABNORMAL HIGH (ref 0.61–1.24)
GFR, Estimated: 45 mL/min — ABNORMAL LOW (ref >60)
Glucose, Bld: 92 mg/dL (ref 70–99)
Potassium: 3.4 mmol/L — ABNORMAL LOW (ref 3.5–5.1)
Sodium: 143 mmol/L (ref 135–145)

## 2022-06-23 LAB — GLUCOSE, CAPILLARY
Glucose-Capillary: 81 mg/dL (ref 70–99)
Glucose-Capillary: 90 mg/dL (ref 70–99)
Glucose-Capillary: 100 mg/dL — ABNORMAL HIGH (ref 70–99)
Glucose-Capillary: 83 mg/dL (ref 70–99)
Glucose-Capillary: 119 mg/dL — ABNORMAL HIGH (ref 70–99)
Glucose-Capillary: 154 mg/dL — ABNORMAL HIGH (ref 70–99)

## 2022-06-23 LAB — CBC
HCT: 25.6 % — ABNORMAL LOW (ref 39.0–52.0)
Hemoglobin: 8.2 g/dL — ABNORMAL LOW (ref 13.0–17.0)
MCH: 26.5 pg (ref 26.0–34.0)
MCHC: 32 g/dL (ref 30.0–36.0)
MCV: 82.6 fL (ref 80.0–100.0)
Platelets: 153 10*3/uL (ref 150–400)
RBC: 3.1 MIL/uL — ABNORMAL LOW (ref 4.22–5.81)
RDW: 19.7 % — ABNORMAL HIGH (ref 11.5–15.5)
WBC: 4.5 10*3/uL (ref 4.0–10.5)
nRBC: 0 % (ref 0.0–0.2)

## 2022-06-23 LAB — HEMOGLOBIN A1C
Hgb A1c MFr Bld: 6.8 % — ABNORMAL HIGH (ref 4.8–5.6)
Mean Plasma Glucose: 148.46 mg/dL

## 2022-06-23 MED ORDER — HYDRALAZINE HCL 10 MG PO TABS
10.0000 mg | ORAL_TABLET | Freq: Three times a day (TID) | ORAL | Status: DC
  Administered 2022-06-23 – 2022-06-24 (×4): 10 mg via ORAL
  Filled 2022-06-23 (×4): qty 1

## 2022-06-23 MED ORDER — HYDRALAZINE HCL 20 MG/ML IJ SOLN
5.0000 mg | INTRAMUSCULAR | Status: DC | PRN
  Administered 2022-06-24: 5 mg via INTRAVENOUS
  Filled 2022-06-23: qty 1

## 2022-06-23 MED ORDER — POTASSIUM CHLORIDE 10 MEQ/100ML IV SOLN
10.0000 meq | INTRAVENOUS | Status: AC
  Administered 2022-06-23 (×3): 10 meq via INTRAVENOUS
  Filled 2022-06-23 (×3): qty 100

## 2022-06-23 MED ORDER — GUAIFENESIN 100 MG/5ML PO LIQD
5.0000 mL | ORAL | Status: DC | PRN
  Administered 2022-06-23 – 2022-06-24 (×3): 5 mL via ORAL
  Filled 2022-06-23 (×3): qty 10

## 2022-06-23 NOTE — TOC Initial Note (Signed)
Transition of Care (TOC) - Initial/Assessment Note    Patient Details  Name: Evan FAUCETT Sr. MRN: [id number] Date of Birth: October 15, 1955  Transition of Care [facility]) CM/SW Contact:    Tiburcio Bash, LCSW Phone Number: 06/23/2022, 1:50 PM  Clinical Narrative:                  CSW spoke with patient at bedside regarding [facility] patient indicated okay to continue with [facility]. Not currently active as they ended services on 11/3. Corene Cornea with [facility] is reviewing referral.   CSW lvm for patient's spouse Rise Paganini to inquire if they have hemi walker at home recommended by PT. CSW spoke with patient's mother Lannette Donath who reports she's uncertain. Reports they will ensure spouse calls back, as she is most likely working.   Expected Discharge Plan: [place] Barriers to Discharge: Continued Medical Work up   Patient Goals and CMS Choice Patient states their goals for this hospitalization and ongoing recovery are:: to go home CMS Medicare.gov Compare Post Acute Care list provided to:: Patient Choice offered to / list presented to : Patient  Expected Discharge Plan and Services Expected Discharge Plan: [place]       Living arrangements for the past 2 months: Single Family Home                                      Prior Living Arrangements/Services Living arrangements for the past 2 months: Single Family Home                     Activities of Daily Living Home Assistive Devices/Equipment: Other (Comment) ADL Screening (condition at time of admission) Patient's cognitive ability adequate to safely complete daily activities?: No Is the patient deaf or have difficulty hearing?: No Does the patient have difficulty seeing, even when wearing glasses/contacts?: No Does the patient have difficulty concentrating, remembering, or making decisions?: No Patient able to express need for assistance with ADLs?: Yes Does the patient have  difficulty dressing or bathing?: Yes Independently performs ADLs?: No Does the patient have difficulty walking or climbing stairs?: Yes Weakness of Legs: Both Weakness of Arms/Hands: Both  Permission Sought/Granted                  Emotional Assessment              Admission diagnosis:  Bradycardia [R00.1] Hypoglycemia [E16.2] Altered mental status, unspecified altered mental status type [R41.82] Patient Active Problem List   Diagnosis Date Noted   Acute kidney injury superimposed on chronic kidney disease ([place]) 06/23/2022   Hypoglycemia 06/22/2022   Stroke ([place]) 06/22/2022   Chronic diastolic CHF (congestive heart failure) ([place]) 06/22/2022   Iron deficiency anemia 06/22/2022   Chronic kidney disease, stage 3b ([place]) 15/17/6160   Acute metabolic encephalopathy 73/71/0626   Type II diabetes mellitus with renal manifestations ([place]) 06/22/2022   Acute ischemic stroke ([place]) 04/22/2022   Slurred speech    Weakness 04/21/2022   CKD stage 3 due to type 2 diabetes mellitus ([place]) 04/21/2022   Hypothermia 01/10/2022   Anemia due to stage 3b chronic kidney disease ([place]) 01/08/2022   Symptomatic bradycardia 01/07/2022   BPH (benign prostatic hyperplasia) 01/07/2022   Paroxysmal atrial fibrillation ([place]) 01/07/2022   Dyslipidemia 01/07/2022   Elevated liver enzymes 12/28/2021   Sinus pause    Bradycardia 10/16/2021  Hypertensive emergency without congestive heart failure 09/14/2021   Unstable angina (HCC)    Acute CVA (cerebrovascular accident) (HCC) 06/05/2021   Need for influenza vaccination 06/02/2021   Constipation 06/02/2021   Insomnia 06/02/2021   Gastroparesis 02/26/2021   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   Muscle spasm of left lower extremity 02/26/2021   History of stroke 02/26/2021   Diastolic dysfunction    Monitoring for anticoagulant use 06/27/2020   Lower extremity edema 05/16/2020   Atrial fibrillation, chronic (HCC) 10/26/2019   Coagulopathy  (HCC) 06/12/2019   Gastroesophageal reflux disease    Hemiplegia and hemiparesis following cerebral infarction affecting left non-dominant side (HCC) 11/25/2018   RBBB 10/15/2018   Dysphagia, post-stroke    Microalbuminuria 07/29/2018   Hyperlipidemia LDL goal <70 02/11/2017   Recurrent strokes (HCC) 11/10/2016   Essential hypertension 11/10/2016   Uncontrolled type 2 diabetes mellitus with hyperglycemia, with long-term current use of insulin (HCC) 11/10/2016   Tobacco abuse 08/28/2013   Peripheral neuropathy 08/28/2013   PCP:  Larae Grooms, NP Pharmacy:   [facility] - [place], [place] - [address] [place] [address] Phone: [phone] Fax: [phone]  [facility] [address], [place] - [address] [place] [address] Phone: [phone] Fax: [phone]  [facility] [address]), [place] - [address] [place]) [place] [address] Phone: [phone] Fax: [phone]     Social Determinants of Health (SDOH) Interventions    Readmission Risk Interventions     No data to display

## 2022-06-23 NOTE — Evaluation (Signed)
Physical Therapy Evaluation Patient Details Name: Evan REPPUCCI Sr. MRN: [id number] DOB: 01/01/1956 Today's Date: 06/23/2022  History of Present Illness  Evan BISPING Sr. is a 66 y.o. male with medical history significant of multiple strokes with left-sided weakness, hypertension, hyperlipidemia, diabetes mellitus, PAF on Eliquis, diastolic CHF, iron deficiency anemia, CKD-3B, chronic bradycardia (heart rate as low as 30s-40s), who presents with generalized weakness.     Per her wife at the bedside, patient reports feeling weak since this morning.  Patient has multiple strokes in the past, with left-sided weakness, left facial droop and some difficulty speaking, which has no significant change.  Patient has shortness breath. Patient was found to have hypoglycemia with blood sugar 39, was given D50 in ED.  Patient was found to have bradycardia with heart rate 30s-40s.  Clinical Impression  Pt received in bed agreeable to PT evaluation. Pt reported of coughing since yesterday. BP 169/77 in fowler position and in standing 168/70, HR between 60s through out the session. Pt symptomatic except pain in R hand 8/10 which is chronic asper pt. Pt demonstrates genralized weakness and residual deficits in LLE from prior stroke. Posture forward flexed correctable with VC. Pt performed Bed mobility with set up assist, STS with min assist and Step pivot transfer with Min assist using FWW. Pt will benefit from using HW with functional mobility as he uses it at home. Pt received HH aide x 3 hours every day 5 x week. Pt has 3 sSTE but has ramp. Pt independent Ambulator at household level with Set up assist from Aide for ADLs and IADLs. Pt's wife works 3 hours and is home with pt as per pt. Pt is A and O x 3 except time. Pt will continue in acute care and will benefit from [place] [place] and [place] ( if pt does not have one.)      Recommendations for follow up therapy are one component of a multi-disciplinary discharge  planning process, led by the attending physician.  Recommendations may be updated based on patient status, additional functional criteria and insurance authorization.  Follow Up Recommendations Home health PT [facility] aide)      Assistance Recommended at Discharge Intermittent Supervision/Assistance  Patient can return home with the following  A little help with walking and/or transfers;A little help with bathing/dressing/bathroom;Assistance with cooking/housework;Direct supervision/assist for medications management;Assist for transportation;Help with stairs or ramp for entrance    Equipment Recommendations None recommended by PT (Hemi walker)  Recommendations for Other Services       Functional Status Assessment Patient has had a recent decline in their functional status and demonstrates the ability to make significant improvements in function in a reasonable and predictable amount of time.     Precautions / Restrictions Precautions Precautions: Fall Restrictions Weight Bearing Restrictions: No      Mobility  Bed Mobility Overal bed mobility: Modified Independent             General bed mobility comments: Needs set up help    Transfers Overall transfer level: Needs assistance Equipment used: Rolling walker (2 wheels) Transfers: Sit to/from Stand, Bed to chair/wheelchair/BSC Sit to Stand: Min assist   Step pivot transfers: Min assist       General transfer comment: Woul dbenefit form HW .    Ambulation/Gait Ambulation/Gait assistance: Mod assist Gait Distance (Feet): 2 Feet Assistive device: Rolling walker (2 wheels) Gait Pattern/deviations: Step-to pattern, Shuffle Gait velocity: dec     General Gait Details: VC to imporve  posutre and wt shifting.  Stairs: N/T            Wheelchair Mobility    Modified Rankin (Stroke Patients Only)       Balance Overall balance assessment: Needs assistance Sitting-balance support: Single extremity supported,  Feet supported Sitting balance-Leahy Scale: Good Sitting balance - Comments: Sup needed due to synergy increases with coughing   Standing balance support: Single extremity supported Standing balance-Leahy Scale: Fair Standing balance comment: pt needs min assist to maintain uprigh posture.                             Pertinent Vitals/Pain Pain Assessment Pain Assessment: No/denies pain    Home Living Family/patient expects to be discharged to:: Private residence Living Arrangements: Spouse/significant other Available Help at Discharge: Family;Available PRN/intermittently Type of Home: House Home Access: Ramped entrance;Stairs to enter Entrance Stairs-Rails: Right;Left Entrance Stairs-Number of Steps: 3, ramp in the back pt uses   Home Layout: One level Home Equipment: Agricultural consultant (2 wheels);Shower seat;BSC/3in1 (Hemi walker) Additional Comments: Pt receives HH aide 3 hrs 5 x week.    Prior Function Prior Level of Function : Independent/Modified Independent             Mobility Comments: As per pt with slurred speech,:Able ambualte independently at household level using HW. ADLs Comments: Needs set up help wtih Bathing, eating, dressing.     Hand Dominance   Dominant Hand: Right    Extremity/Trunk Assessment   Upper Extremity Assessment Upper Extremity Assessment: Defer to OT evaluation    Lower Extremity Assessment Lower Extremity Assessment: LLE deficits/detail LLE Deficits / Details: Chroninc residule deficits from old stroke 3/5 MMT LLE Sensation: decreased proprioception;decreased light touch LLE Coordination: decreased gross motor       Communication   Communication: Expressive difficulties  Cognition Arousal/Alertness: Awake/alert Behavior During Therapy: WFL for tasks assessed/performed Overall Cognitive Status: Within Functional Limits for tasks assessed                                 General Comments: Disoriented to  time        General Comments      Exercises     Assessment/Plan    PT Assessment Patient needs continued PT services  PT Problem List Decreased strength;Decreased activity tolerance;Decreased balance;Pain;Impaired sensation       PT Treatment Interventions Gait training;Functional mobility training;Therapeutic activities;Therapeutic exercise;Balance training;Neuromuscular re-education    PT Goals (Current goals can be found in the Care Plan section)  Acute Rehab PT Goals Patient Stated Goal: " I want to get better and go home." PT Goal Formulation: With patient Time For Goal Achievement: 07/07/22 Potential to Achieve Goals: Good    Frequency Min 2X/week     Co-evaluation               AM-PAC PT "6 Clicks" Mobility  Outcome Measure Help needed turning from your back to your side while in a flat bed without using bedrails?: None Help needed moving from lying on your back to sitting on the side of a flat bed without using bedrails?: None Help needed moving to and from a bed to a chair (including a wheelchair)?: A Little Help needed standing up from a chair using your arms (e.g., wheelchair or bedside chair)?: A Little Help needed to walk in hospital room?: A Lot Help needed climbing 3-5 steps  with a railing? : Total 6 Click Score: 17    End of Session Equipment Utilized During Treatment: Gait belt Activity Tolerance: Patient tolerated treatment well Patient left: in chair;with call bell/phone within reach;with chair alarm set Nurse Communication: Mobility status PT Visit Diagnosis: Unsteadiness on feet (R26.81);Muscle weakness (generalized) (M62.81);Pain;Hemiplegia and hemiparesis Hemiplegia - Right/Left: Left Hemiplegia - dominant/non-dominant: Non-dominant Hemiplegia - caused by:  (Old stroke)    Time: 0929-0950 PT Time Calculation (min) (ACUTE ONLY): 21 min   Charges:   PT Evaluation $PT Eval Low Complexity: 1 Low PT Treatments $Therapeutic Activity:  8-22 mins      Jaykob Minichiello PT DPT 11:12 AM,06/23/22

## 2022-06-23 NOTE — Progress Notes (Signed)
[facility]                                                                              Evan Townsend, is a 65 y.o. male, DOB - [birth year], WUJ:[id number] Admit date - 06/22/2022    Outpatient Primary MD for the patient is Evan Grooms, NP  LOS - 0  days  Chief Complaint  Patient presents with   Altered Mental Status   Bradycardia       Brief summary   Patient is a 66 year old male with prior history of multiple strokes with left-sided weakness, residual dysphagia, HTN, HLP,  NIDDM, PAF on Eliquis, diastolic CHF, iron deficiency anemia, CKD stage IIIb, chronic bradycardia presented with generalized weakness.  Per wife at the bedside patient had been feeling weak since the morning of admission.  At baseline, has left-sided weakness, left facial droop with some difficulty speaking, no significant change.  Per his wife, he looked more confused than baseline, lethargic but arousable.  Patient was found to have hypoglycemia with blood sugar 39 and was given D50 in ED.  Patient was found to have bradycardia with heart rate 30s to 40s and was given 1 dose of atropine by EMS. BMET BUN 29, creatinine 1.9, was 1.75 on 06/09/22 Hb 8.9  He was admitted for further work-up  ,  Assessment & Plan    Principal Problem:   Hypoglycemia -In the setting of known history of diabetes mellitus type 2 on oral hypoglycemics, poor p.o. intake, has residual dysphagia.  CBG was down to 39 -Hold Farxiga, glipizide -SLP evaluation, started on D5 half-normal saline - DM coordinator recommendations appreciated    Active Problems:      Acute kidney injury superimposed on chronic kidney disease 3b (HCC) -Creatinine 1.7 on 06/09/2022, baseline 1.4-1.7, presented with creatinine of 1.9 -Hold losartan, Farxiga -Continue gentle hydration   Sinus bradycardia -Appears to be a chronic issue with heart rates in the 30s to 40s in the past - EDP discussed with Dr. Kirke Corin of cardiology, who  recommended to treat patient's hypoglycemia now.  If patient becomes symptomatic, will need to formally consult cardiology -Avoid using nodal blockers, continue telemetry and prn atropine for symptomatic bradycardia    Acute metabolic encephalopathy: - Likely due to hypoglycemia.  CT head negative. -Currently alert and oriented, following commands, appears close to his baseline   Chronic diastolic CHF (congestive heart failure) (HCC) - 2D echo on 04/22/2022 showed EF of 55 to 60%.  -Currently euvolemic, placed on gentle hydration, follow volume status    Hyperlipidemia LDL goal <70 -Continue Lipitor   Essential hypertension -IV hydralazine as needed -Continue to hold amlodipine, losartan -BP has been trending high, will place on oral hydralazine 10mg  TID  Atrial fibrillation, chronic (HCC) bradycardia -Currently has bradycardia, not on any rate controlling meds -Continue eliquis   History of prior CVA with residual left-sided weakness -Continue Lipitor, eliquis  Hypokalemia -Replaced IV   Iron deficiency anemia -Hemoglobin 8.9 on admission, baseline 9-10, no reports of GI bleed -Continue iron supplement   Type 2 diabetes mellitus, NIDDM, with CKD, with hypoglycemia -Hemoglobin A1c 6.8 -Hold glipizide and  type II diabetes mellitus with renal manifestation, not on insulin: Recent A1c 7.0.  Patient is taking glipizide and [place].  Now has hypoglycemia -Hold glipizide and [place].  Code Status: Full CODE STATUS DVT Prophylaxis:   apixaban (ELIQUIS) tablet 5 mg   Level of Care: Level of care: Progressive Family Communication: Updated patient's wife on phone today   Disposition Plan:      Remains inpatient appropriate: Hopefully DC home in next 24 to 48 hours pending improvement   Procedures:  None   Consultants:   None   Antimicrobials:   Medications  apixaban  5 mg Oral BID   atorvastatin  80 mg Oral QPM   iron polysaccharides  150 mg Oral Daily    mirtazapine  7.5 mg Oral QHS   pantoprazole  40 mg Oral Daily   tamsulosin  0.4 mg Oral Daily      Subjective:   Evan Townsend was seen and examined today.  Alert and oriented, CBGs now improving however on D5 drip.  No acute nausea vomiting abdominal pain or diarrhea.  BP readings elevated.   Objective:   Vitals:   06/22/22 1930 06/22/22 2240 06/23/22 0230 06/23/22 0727  BP: (!) 171/98 (!) 157/84 (!) 155/75   Pulse: (!) 47 (!) 54 (!) 52 (!) 45  Resp: (!) 26 19 20    Temp:  97.8 F (36.6 C) 98.2 F (36.8 C) (!) 97.5 F (36.4 C)  TempSrc:  Oral Oral Oral  SpO2: 99% 100% 99% 100%  Weight:      Height:        Intake/Output Summary (Last 24 hours) at 06/23/2022 1127 Last data filed at 06/23/2022 0842 Gross per 24 hour  Intake 973.71 ml  Output 650 ml  Net 323.71 ml     Wt Readings from Last 3 Encounters:  06/22/22 68.9 kg  05/22/22 71.8 kg  04/21/22 65.1 kg     Exam General: Alert and oriented x 3, NAD, has residual dysarthria  Cardiovascular: ireg ireg Respiratory: CTAB Gastrointestinal: Soft, nontender, nondistended, + bowel sounds Ext: no pedal edema bilaterally Neuro: chronic left-sided weakness Psych: Normal affect    Data Reviewed:  I have personally reviewed following labs    CBC Lab Results  Component Value Date   WBC 4.5 06/23/2022   RBC 3.10 (L) 06/23/2022   HGB 8.2 (L) 06/23/2022   HCT 25.6 (L) 06/23/2022   MCV 82.6 06/23/2022   MCH 26.5 06/23/2022   PLT 153 06/23/2022   MCHC 32.0 06/23/2022   RDW 19.7 (H) 06/23/2022   LYMPHSABS 1.7 06/09/2022   MONOABS 0.5 04/21/2022   EOSABS 0.1 06/09/2022   BASOSABS 0.0 06/09/2022     Last metabolic panel Lab Results  Component Value Date   NA 143 06/23/2022   K 3.4 (L) 06/23/2022   CL 119 (H) 06/23/2022   CO2 21 (L) 06/23/2022   BUN 24 (H) 06/23/2022   CREATININE 1.68 (H) 06/23/2022   GLUCOSE 92 06/23/2022   GFRNONAA 45 (L) 06/23/2022   GFRAA 75 10/03/2020   CALCIUM 8.2 (L) 06/23/2022    PROT 7.0 06/22/2022   ALBUMIN 3.1 (L) 06/22/2022   LABGLOB 3.1 06/09/2022   AGRATIO 1.3 06/09/2022   BILITOT 0.5 06/22/2022   ALKPHOS 134 (H) 06/22/2022   AST 30 06/22/2022   ALT 39 06/22/2022   ANIONGAP 3 (L) 06/23/2022    CBG (last 3)  Recent Labs    06/23/22 0244 06/23/22 0421 06/23/22 0730  GLUCAP 81 90 100*  Coagulation Profile: Recent Labs  Lab 06/22/22 1012  INR 1.3*     Radiology Studies: I have personally reviewed the imaging studies  DG Chest Port 1 View  Result Date: 06/22/2022 CLINICAL DATA:  Short of breath EXAM: PORTABLE CHEST 1 VIEW COMPARISON:  None Available. FINDINGS: Normal mediastinum and cardiac silhouette. Normal pulmonary vasculature. No evidence of effusion, infiltrate, or pneumothorax. No acute bony abnormality. IMPRESSION: No acute cardiopulmonary process. Electronically Signed   By: Suzy Bouchard M.D.   On: 06/22/2022 13:06   CT Head Wo Contrast  Result Date: 06/22/2022 CLINICAL DATA:  Provided history: Mental status change, unknown cause. History of stroke. Left-sided weakness, facial droop, slurred speech. Increased generalized weakness. EXAM: CT HEAD WITHOUT CONTRAST TECHNIQUE: Contiguous axial images were obtained from the base of the skull through the vertex without intravenous contrast. RADIATION DOSE REDUCTION: This exam was performed according to the departmental dose-optimization program which includes automated exposure control, adjustment of the mA and/or kV according to patient size and/or use of iterative reconstruction technique. COMPARISON:  Brain MRI 04/21/2022. Head CT 04/21/2022. FINDINGS: Brain: No age advanced or lobar predominant parenchymal atrophy. Chronic small-vessel infarcts within the right centrum semiovale, bilateral corona radiata and bilateral deep gray nuclei. Chronic lacunar infarcts within the pons. Small chronic infarcts within the bilateral cerebellar hemispheres. Some of these infarcts were better appreciated  on the prior brain MRI 04/21/2022. Background mild patchy and ill-defined hypoattenuation within the cerebral white matter, nonspecific but compatible with chronic small vessel ischemic disease. There is no acute intracranial hemorrhage. No demarcated cortical infarct. No extra-axial fluid collection. No evidence of an intracranial mass. No midline shift. Vascular: No hyperdense vessel. Skull: No fracture or aggressive osseous lesion. Sinuses/Orbits: No mass or acute finding within the imaged orbits. Mild mucosal thickening, and small mucous retention cyst, within the right maxillary sinus. Mild mucosal thickening, and small-volume frothy secretions, within the left maxillary sinus. Mild mucosal thickening within the bilateral anterior ethmoid air cells and frontoethmoidal recesses. IMPRESSION: 1. No evidence of acute intracranial abnormality. 2. Multiple known chronic infarcts within the supratentorial and infratentorial brain, as detailed. 3. Paranasal sinus disease at the imaged levels, as described. Electronically Signed   By: Kellie Simmering D.O.   On: 06/22/2022 11:58       Evan Townsend M.D. [facility] 06/23/2022, 11:27 AM  Available via Epic secure chat 7am-7pm After 7 pm, please refer to night coverage provider listed on amion.

## 2022-06-23 NOTE — Evaluation (Signed)
Clinical/Bedside Swallow Evaluation Patient Details  Name: Evan Townsend Sr. MRN: [id number] Date of Birth: 22-Mar-1956  Today's Date: 06/23/2022 Time: SLP Start Time (ACUTE ONLY): 1330 SLP Stop Time (ACUTE ONLY): 1346 SLP Time Calculation (min) (ACUTE ONLY): 16 min  Past Medical History:  Past Medical History:  Diagnosis Date   Acute CVA (cerebrovascular accident) ([place]) 06/05/2021   Acute ischemic stroke ([place]) 04/22/2022   CAP (community acquired pneumonia) 11/26/2021   Carotid arterial disease ([place])    a. 08/2018 Carotid U/S: min-mod RICA atherosclerosis w/o hemodynamically significant stenosis. Nl LICA.   Diabetes 1.5, managed as type 2 ([place])    Diastolic dysfunction    a. 08/2018 Echo: EF 65%. No rwma. Gr1 DD. Mild MR.   Diastolic dysfunction    a. 08/2019 Echo: EF 55-60%, Gr1 DD. No rwma. Mild MR. RVSP 37.41mmHg.   Hypercholesterolemia    Hypertension    PAF (paroxysmal atrial fibrillation) ([place])    a. 10/2019 Event Monitor: PAF; b. CHA2DS2VASc = 5-->Eliquis.   Poorly controlled diabetes mellitus ([place])    a. 04/2019 A1c 13.8.   Recurrent strokes ([place])    a. 10/2016 MRI/A: Acute 69mm R thalamic infarct, ? subacute infarct of R corona radiata; b. 08/2017 MRI/A: Acute 77mm lateral L thalamic infarct. Other more remote lacunar infarcts of thalami bilat. Small vessel dzs; c. 08/2018 MRI/A: Acute lacunar infarct of the post limb of R internal capsule; d. 08/2019 MRI Acute CVA of L paramedian pons adn R cerebellar hemisphere.   Sepsis due to pneumonia ([place]) 11/26/2021   Tobacco abuse    Past Surgical History:  Past Surgical History:  Procedure Laterality Date   ESOPHAGOGASTRODUODENOSCOPY (EGD) WITH PROPOFOL N/A 04/26/2019   Procedure: ESOPHAGOGASTRODUODENOSCOPY (EGD) WITH PROPOFOL;  Surgeon: Lin Landsman, MD;  Location: [facility] END[facility];  Service: Gastroenterology;  Laterality: N/A;   LEFT HEART CATH AND CORONARY ANGIOGRAPHY N/A 01/09/2022   Procedure: LEFT HEART CATH AND CORONARY  ANGIOGRAPHY;  Surgeon: Nelva Bush, MD;  Location: [facility];  Service: Cardiovascular;  Laterality: N/A;   NO PAST SURGERIES     TEE WITHOUT CARDIOVERSION N/A 04/22/2022   Procedure: TRANSESOPHAGEAL ECHOCARDIOGRAM (TEE);  Surgeon: Kate Sable, MD;  Location: [facility] ORS;  Service: Cardiovascular;  Laterality: N/A;   HPI:  Patient is a 66 year old male with prior history of multiple strokes with left-sided weakness, residual dysphagia, HTN, HLP,  NIDDM, PAF on Eliquis, diastolic CHF, iron deficiency anemia, CKD stage IIIb, chronic bradycardia presented with generalized weakness.  Per wife at the bedside patient had been feeling weak since the morning of admission.  At baseline, has left-sided weakness, left facial droop with some difficulty speaking, no significant change.  Per his wife, he looked more confused than baseline, lethargic but arousable.  Patient was found to have hypoglycemia with blood sugar 39 and was given D50 in ED.  Patient was found to have bradycardia with heart rate 30s to 40s and was given 1 dose of atropine by EMS.  BMET BUN 29, creatinine 1.9, was 1.75 on 06/09/22    Assessment / Plan / Recommendation  Clinical Impression  While pt has increased risk of aspiration d/t hx of multiple strokes, he consumed thin liquids via straw, puree and dysphagia 2 items from lunch tray without overt s/s of aspiration. When consuming thin liquids via straw, pt consumed large consecutive sips without overt s/s of aspiration. I also called and spoke with pt's wife. She reports that pt has been consuming thin liquids without any coughing or  respiratory decline. At this time, recommend pt consume currently prescribed diet with thin liquids via straw and STRICT ASPIRATION PRECAUTIONS. Should pt experience respiratory decline, please reconsult ST services.  SLP Visit Diagnosis: Dysphagia, unspecified (R13.10)    Aspiration Risk  Moderate aspiration risk    Diet Recommendation  Dysphagia 2 (Fine chop);Thin liquid   Liquid Administration via: Straw Medication Administration: Whole meds with puree Supervision: Staff to assist with self feeding;Full supervision/cueing for compensatory strategies Compensations: Minimize environmental distractions;Slow rate;Small sips/bites Postural Changes: Seated upright at 90 degrees;Remain upright for at least 30 minutes after po intake    Other  Recommendations Oral Care Recommendations: Oral care BID    Recommendations for follow up therapy are one component of a multi-disciplinary discharge planning process, led by the attending physician.  Recommendations may be updated based on patient status, additional functional criteria and insurance authorization.  Follow up Recommendations No SLP follow up         Functional Status Assessment Patient has not had a recent decline in their functional status    Swallow Study   General Date of Onset: 06/22/22 HPI: Patient is a 66 year old male with prior history of multiple strokes with left-sided weakness, residual dysphagia, HTN, HLP,  NIDDM, PAF on Eliquis, diastolic CHF, iron deficiency anemia, CKD stage IIIb, chronic bradycardia presented with generalized weakness.  Per wife at the bedside patient had been feeling weak since the morning of admission.  At baseline, has left-sided weakness, left facial droop with some difficulty speaking, no significant change.  Per his wife, he looked more confused than baseline, lethargic but arousable.  Patient was found to have hypoglycemia with blood sugar 39 and was given D50 in ED.  Patient was found to have bradycardia with heart rate 30s to 40s and was given 1 dose of atropine by EMS.  BMET BUN 29, creatinine 1.9, was 1.75 on 06/09/22 Type of Study: Bedside Swallow Evaluation Previous Swallow Assessment: previous BSE - most recent recommend nectar thick liquids Diet Prior to this Study: Dysphagia 2 (chopped);Thin liquids Temperature Spikes Noted:  No Respiratory Status: Room air History of Recent Intubation: No Behavior/Cognition: Alert;Cooperative;Pleasant mood Oral Cavity Assessment: Within Functional Limits Oral Care Completed by SLP: No Oral Cavity - Dentition: Poor condition;Missing dentition Vision: Functional for self-feeding Self-Feeding Abilities: Needs assist;Needs set up Patient Positioning: Upright in bed Baseline Vocal Quality: Low vocal intensity Volitional Cough: Strong Volitional Swallow: Unable to elicit    Oral/Motor/Sensory Function Overall Oral Motor/Sensory Function:  (left side weakness - at baseline)   Ice Chips Ice chips: Not tested   Thin Liquid Thin Liquid: Within functional limits Presentation: Self Fed;Straw    Nectar Thick Nectar Thick Liquid: Not tested   Honey Thick Honey Thick Liquid: Not tested   Puree Puree: Within functional limits Presentation: Spoon   Solid     Solid: Within functional limits Presentation: Self Fed Other Comments: dysphagia 2 lunch tray present     Antonette Hendricks B. Dreama Saa, M.S., CCC-SLP, Tree surgeon Certified Brain Injury Specialist [facility]  [facility] Rehabilitation Services Office [phone] Ascom [phone] Fax [phone]

## 2022-06-24 ENCOUNTER — Encounter: Payer: Medicare Other | Admitting: Occupational Therapy

## 2022-06-24 DIAGNOSIS — E1129 Type 2 diabetes mellitus with other diabetic kidney complication: Secondary | ICD-10-CM

## 2022-06-24 DIAGNOSIS — G9341 Metabolic encephalopathy: Secondary | ICD-10-CM | POA: Diagnosis not present

## 2022-06-24 DIAGNOSIS — E11649 Type 2 diabetes mellitus with hypoglycemia without coma: Secondary | ICD-10-CM | POA: Diagnosis not present

## 2022-06-24 DIAGNOSIS — E162 Hypoglycemia, unspecified: Secondary | ICD-10-CM | POA: Diagnosis not present

## 2022-06-24 DIAGNOSIS — R001 Bradycardia, unspecified: Secondary | ICD-10-CM | POA: Diagnosis not present

## 2022-06-24 LAB — GLUCOSE, CAPILLARY
Glucose-Capillary: 114 mg/dL — ABNORMAL HIGH (ref 70–99)
Glucose-Capillary: 87 mg/dL (ref 70–99)
Glucose-Capillary: 88 mg/dL (ref 70–99)
Glucose-Capillary: 92 mg/dL (ref 70–99)
Glucose-Capillary: 94 mg/dL (ref 70–99)
Glucose-Capillary: 98 mg/dL (ref 70–99)

## 2022-06-24 LAB — BASIC METABOLIC PANEL
Anion gap: 3 — ABNORMAL LOW (ref 5–15)
BUN: 17 mg/dL (ref 8–23)
CO2: 22 mmol/L (ref 22–32)
Calcium: 8.6 mg/dL — ABNORMAL LOW (ref 8.9–10.3)
Chloride: 118 mmol/L — ABNORMAL HIGH (ref 98–111)
Creatinine, Ser: 1.53 mg/dL — ABNORMAL HIGH (ref 0.61–1.24)
GFR, Estimated: 50 mL/min — ABNORMAL LOW (ref >60)
Glucose, Bld: 94 mg/dL (ref 70–99)
Potassium: 3.6 mmol/L (ref 3.5–5.1)
Sodium: 143 mmol/L (ref 135–145)

## 2022-06-24 MED ORDER — ORAL CARE MOUTH RINSE: 15.0000 mL | OROMUCOSAL | Status: DC | PRN

## 2022-06-24 NOTE — Progress Notes (Signed)
    Durable Medical Equipment  (From admission, onward)           Start     Ordered   06/24/22 1035  For home use only DME Hospital bed  Once       Question Answer Comment  Length of Need Lifetime   The above medical condition requires: Patient requires the ability to reposition frequently   Bed type Semi-electric      06/24/22 1034           [address] [place], [place], [place] [id number]

## 2022-06-24 NOTE — Progress Notes (Signed)
Discharge instructions given to the patient and patient's spouse.  Both stated understanding to instructions.  Needs addressed.  Discharged home.

## 2022-06-24 NOTE — TOC Transition Note (Signed)
Transition of Care [facility]) - CM/SW Discharge Note   Patient Details  Name: Evan NALL Sr. MRN: [id number] Date of Birth: December 05, 1955  [facility] CM/SW Contact:  Darolyn Rua, LCSW Phone Number: 06/24/2022, 11:11 AM   Clinical Narrative:     Patient to dc home today, CSW spoke with Wife who reports she will pick patient up at 2pm and bring his clothes, treatment team informed. Requests hospital bed, ordered through [employer]. Reports no other dme needs, states patient is being seen by [facility], declines other home services at his time. No further dc needs   Final next level of care: Home/Self Care Barriers to Discharge: No Barriers Identified   Patient Goals and CMS Choice Patient states their goals for this hospitalization and ongoing recovery are:: to go home CMS Medicare.gov Compare Post Acute Care list provided to:: Patient Choice offered to / list presented to : Patient  Discharge Placement                    Patient and family notified of of transfer: 06/24/22  Discharge Plan and Services                                     Social Determinants of Health (SDOH) Interventions     Readmission Risk Interventions     No data to display

## 2022-06-24 NOTE — Discharge Summary (Signed)
Physician Discharge Summary   Evan DULAY Sr. VFI:[id number] DOB: [birth year] DOA: 06/22/2022  PCP: Larae Grooms, NP  Admit date: 06/22/2022 Discharge date: 06/24/2022  Barriers to discharge: none  Admitted From: Home Disposition:  Home Discharging physician: Lewie Chamber, MD  Recommendations for Outpatient Follow-up:  May consider resuming farxiga at follow up Patient remained asymptomatic with sinus bradycardia  Home Health:  Equipment/Devices:   Discharge Condition: stable CODE STATUS: Full Diet recommendation:  Diet Orders (From admission, onward)     Start     Ordered   06/22/22 1232  DIET DYS 2 Room service appropriate? Yes; Fluid consistency: Thin  Diet effective now       Question Answer Comment  Room service appropriate? Yes   Fluid consistency: Thin      06/22/22 1231            Hospital Course:  Hypoglycemia DMII - A1c 6.8% -Suspect due to cumulative effect of Farxiga with glipizide -Given good control as evidenced by A1c, both medications discontinued -Could consider resuming Farxiga alone at follow-up  Acute kidney injury superimposed on chronic kidney disease 3b (HCC) -Creatinine 1.7 on 06/09/2022, baseline 1.4-1.7, presented with creatinine of 1.9 -Improved with fluid resuscitation - Home meds resumed at discharge    Sinus bradycardia -Appears to be a chronic issue with heart rates in the 30s to 40s in the past - EDP discussed with Dr. Kirke Corin of cardiology, who recommended to treat patient's hypoglycemia for now -Patient remained asymptomatic with ongoing mild sinus bradycardia -If becomes symptomatic, would warrant cardiology referral    Acute metabolic encephalopathy: - Likely due to hypoglycemia.  CT head negative. -Currently alert and oriented, following commands, appears close to his baseline   Chronic diastolic CHF (congestive heart failure) (HCC) - 2D echo on 04/22/2022 showed EF of 55 to 60%.    Hyperlipidemia LDL goal  <70 -Continue Lipitor   Essential hypertension -Home regimen resumed at discharge   Atrial fibrillation, chronic (HCC) bradycardia -Currently has bradycardia, not on any rate controlling meds -Continue eliquis   History of prior CVA with residual left-sided weakness -Continue Lipitor, eliquis   Hypokalemia -Replaced   Iron deficiency anemia -Hemoglobin 8.9 on admission, baseline 9-10, no reports of GI bleed -Continue iron supplement   The patient's chronic medical conditions were treated accordingly per the patient's home medication regimen except as noted.  On day of discharge, patient was felt deemed stable for discharge. Patient/family member advised to call PCP or come back to ER if needed.   Principal Diagnosis: Hypoglycemia  Discharge Diagnoses: Active Hospital Problems   Diagnosis Date Noted   Hypoglycemia 06/22/2022    Priority: High   Bradycardia 10/16/2021    Priority: 2.   Acute metabolic encephalopathy 06/22/2022    Priority: 3.   Chronic diastolic CHF (congestive heart failure) (HCC) 06/22/2022    Priority: 4.   Hyperlipidemia LDL goal <70 02/11/2017    Priority: 5.   Essential hypertension 11/10/2016    Priority: 6.   Atrial fibrillation, chronic (HCC) 10/26/2019    Priority: 7.   Stroke (HCC) 06/22/2022    Priority: 8.   Chronic kidney disease, stage 3b (HCC) 06/22/2022    Priority: 9.   Iron deficiency anemia 06/22/2022    Priority: 10.   Type II diabetes mellitus with renal manifestations (HCC) 06/22/2022    Priority: 11.   Acute kidney injury superimposed on chronic kidney disease (HCC) 06/23/2022    Resolved Hospital Problems  No resolved problems to display.  Discharge Instructions     Increase activity slowly   Complete by: As directed       Allergies as of 06/24/2022   No Known Allergies      Medication List     STOP taking these medications    dapagliflozin propanediol 10 MG Tabs tablet Commonly known as: Farxiga    glipiZIDE 10 MG tablet Commonly known as: GLUCOTROL       TAKE these medications    albuterol 108 (90 Base) MCG/ACT inhaler Commonly known as: VENTOLIN HFA Inhale 2 puffs into the lungs every 6 (six) hours as needed for wheezing or shortness of breath.   amLODipine 10 MG tablet Commonly known as: NORVASC TAKE 1 TABLET BY MOUTH DAILY   atorvastatin 80 MG tablet Commonly known as: LIPITOR TAKE 1 TABLET BY MOUTH EVERY EVENING   Eliquis 5 MG Tabs tablet Generic drug: apixaban TAKE 1 TABLET BY MOUTH TWICE A DAY   iron polysaccharides 150 MG capsule Commonly known as: NIFEREX Take 1 capsule (150 mg total) by mouth daily.   losartan 25 MG tablet Commonly known as: COZAAR Take 1 tablet (25 mg total) by mouth daily.   melatonin 1 MG Tabs tablet Take 3 tablets (3 mg total) by mouth at bedtime as needed.   metoCLOPramide 5 MG tablet Commonly known as: REGLAN TAKE 1 TABLET BY MOUTH 3 TIMES DAILY BEFORE MEALS   mirtazapine 7.5 MG tablet Commonly known as: REMERON Take 1 tablet (7.5 mg total) by mouth at bedtime.   nitroGLYCERIN 0.4 MG SL tablet Commonly known as: NITROSTAT Place 1 tablet (0.4 mg total) under the tongue every 5 (five) minutes as needed for chest pain.   omeprazole 40 MG capsule Commonly known as: PRILOSEC TAKE 1 CAPSULE BY MOUTH IN THE MORNING AND AT BEDTIME   tamsulosin 0.4 MG Caps capsule Commonly known as: FLOMAX Take 1 capsule (0.4 mg total) by mouth daily.               Durable Medical Equipment  (From admission, onward)           Start     Ordered   06/24/22 1035  For home use only DME Hospital bed  Once       Question Answer Comment  Length of Need Lifetime   The above medical condition requires: Patient requires the ability to reposition frequently   Bed type Semi-electric      06/24/22 1034            No Known Allergies  Consultations:   Procedures:   Discharge Exam: BP (!) 147/86   Pulse 66   Temp 98.1 F  (36.7 C)   Resp 15   Ht 5\' 6"  (1.676 m)   Wt 74.8 kg   SpO2 99%   BMI 26.60 kg/m  Physical Exam Constitutional:      General: He is not in acute distress.    Appearance: Normal appearance.  HENT:     Head: Normocephalic and atraumatic.     Mouth/Throat:     Mouth: Mucous membranes are moist.  Eyes:     Extraocular Movements: Extraocular movements intact.  Cardiovascular:     Rate and Rhythm: Normal rate and regular rhythm.     Heart sounds: Normal heart sounds.  Pulmonary:     Effort: Pulmonary effort is normal. No respiratory distress.     Breath sounds: Normal breath sounds. No wheezing.  Abdominal:     General: Bowel sounds are normal. There  is no distension.     Palpations: Abdomen is soft.     Tenderness: There is no abdominal tenderness.  Musculoskeletal:        General: Normal range of motion.     Cervical back: Normal range of motion and neck supple.  Skin:    General: Skin is warm and dry.  Neurological:     Mental Status: He is alert. Mental status is at baseline.  Psychiatric:        Mood and Affect: Mood normal.        Behavior: Behavior normal.      The results of significant diagnostics from this hospitalization (including imaging, microbiology, ancillary and laboratory) are listed below for reference.   Microbiology: Recent Results (from the past 240 hour(s))  SARS Coronavirus 2 by RT PCR (hospital order, performed in [facility] hospital lab) *cepheid single result test* Anterior Nasal Swab     Status: None   Collection Time: 06/22/22  3:03 PM   Specimen: Anterior Nasal Swab  Result Value Ref Range Status   SARS Coronavirus 2 by RT PCR NEGATIVE NEGATIVE Final    Comment: (NOTE) SARS-CoV-2 target nucleic acids are NOT DETECTED.  The SARS-CoV-2 RNA is generally detectable in upper and lower respiratory specimens during the acute phase of infection. The lowest concentration of SARS-CoV-2 viral copies this assay can detect is 250 copies / mL. A  negative result does not preclude SARS-CoV-2 infection and should not be used as the sole basis for treatment or other patient management decisions.  A negative result may occur with improper specimen collection / handling, submission of specimen other than nasopharyngeal swab, presence of viral mutation(s) within the areas targeted by this assay, and inadequate number of viral copies (<250 copies / mL). A negative result must be combined with clinical observations, patient history, and epidemiological information.  Fact Sheet for Patients:   RoadLapTop.co.zahttps://www.fda.gov/media/158405/download  Fact Sheet for Healthcare Providers: http://kim-miller.com/https://www.fda.gov/media/158404/download  This test is not yet approved or  cleared by the [place] FDA and has been authorized for detection and/or diagnosis of SARS-CoV-2 by FDA under an Emergency Use Authorization (EUA).  This EUA will remain in effect (meaning this test can be used) for the duration of the COVID-19 declaration under Section 564(b)(1) of the Act, 21 U.S.C. section 360bbb-3(b)(1), unless the authorization is terminated or revoked sooner.  Performed at [facility], [address] [place], [place] [address]      Labs: BNP (last 3 results) Recent Labs    06/22/22 1012  BNP 168.2*   Basic Metabolic Panel: Recent Labs  Lab 06/22/22 1012 06/23/22 0351 06/24/22 0529  NA 142 143 143  K 3.6 3.4* 3.6  CL 115* 119* 118*  CO2 22 21* 22  GLUCOSE 54* 92 94  BUN 29* 24* 17  CREATININE 1.90* 1.68* 1.53*  CALCIUM 8.5* 8.2* 8.6*   Liver Function Tests: Recent Labs  Lab 06/22/22 1012  AST 30  ALT 39  ALKPHOS 134*  BILITOT 0.5  PROT 7.0  ALBUMIN 3.1*   No results for input(s): "LIPASE", "AMYLASE" in the last 168 hours. No results for input(s): "AMMONIA" in the last 168 hours. CBC: Recent Labs  Lab 06/22/22 1012 06/23/22 0351  WBC 4.0 4.5  HGB 8.9* 8.2*  HCT 27.2* 25.6*  MCV 83.7 82.6  PLT 187 153   Cardiac  Enzymes: No results for input(s): "CKTOTAL", "CKMB", "CKMBINDEX", "TROPONINI" in the last 168 hours. BNP: Invalid input(s): "POCBNP" CBG: Recent Labs  Lab  06/24/22 0128 06/24/22 0342 06/24/22 0534 06/24/22 0750 06/24/22 1124  GLUCAP 88 94 92 87 114*   D-Dimer No results for input(s): "DDIMER" in the last 72 hours. Hgb A1c Recent Labs    06/23/22 0347  HGBA1C 6.8*   Lipid Profile No results for input(s): "CHOL", "HDL", "LDLCALC", "TRIG", "CHOLHDL", "LDLDIRECT" in the last 72 hours. Thyroid function studies Recent Labs    06/22/22 1012  TSH 1.095   Anemia work up No results for input(s): "VITAMINB12", "FOLATE", "FERRITIN", "TIBC", "IRON", "RETICCTPCT" in the last 72 hours. Urinalysis    Component Value Date/Time   COLORURINE YELLOW (A) 06/22/2022 1722   APPEARANCEUR HAZY (A) 06/22/2022 1722   APPEARANCEUR Clear 12/18/2021 1332   LABSPEC 1.012 06/22/2022 1722   PHURINE 5.0 06/22/2022 1722   GLUCOSEU >=500 (A) 06/22/2022 1722   HGBUR SMALL (A) 06/22/2022 1722   BILIRUBINUR NEGATIVE 06/22/2022 1722   BILIRUBINUR Negative 12/18/2021 1332   KETONESUR NEGATIVE 06/22/2022 1722   PROTEINUR >=300 (A) 06/22/2022 1722   NITRITE NEGATIVE 06/22/2022 1722   LEUKOCYTESUR NEGATIVE 06/22/2022 1722   Sepsis Labs Recent Labs  Lab 06/22/22 1012 06/23/22 0351  WBC 4.0 4.5   Microbiology Recent Results (from the past 240 hour(s))  SARS Coronavirus 2 by RT PCR (hospital order, performed in [facility] hospital lab) *cepheid single result test* Anterior Nasal Swab     Status: None   Collection Time: 06/22/22  3:03 PM   Specimen: Anterior Nasal Swab  Result Value Ref Range Status   SARS Coronavirus 2 by RT PCR NEGATIVE NEGATIVE Final    Comment: (NOTE) SARS-CoV-2 target nucleic acids are NOT DETECTED.  The SARS-CoV-2 RNA is generally detectable in upper and lower respiratory specimens during the acute phase of infection. The lowest concentration of SARS-CoV-2 viral copies  this assay can detect is 250 copies / mL. A negative result does not preclude SARS-CoV-2 infection and should not be used as the sole basis for treatment or other patient management decisions.  A negative result may occur with improper specimen collection / handling, submission of specimen other than nasopharyngeal swab, presence of viral mutation(s) within the areas targeted by this assay, and inadequate number of viral copies (<250 copies / mL). A negative result must be combined with clinical observations, patient history, and epidemiological information.  Fact Sheet for Patients:   RoadLapTop.co.za  Fact Sheet for Healthcare Providers: http://kim-miller.com/  This test is not yet approved or  cleared by the [place] FDA and has been authorized for detection and/or diagnosis of SARS-CoV-2 by FDA under an Emergency Use Authorization (EUA).  This EUA will remain in effect (meaning this test can be used) for the duration of the COVID-19 declaration under Section 564(b)(1) of the Act, 21 U.S.C. section 360bbb-3(b)(1), unless the authorization is terminated or revoked sooner.  Performed at [facility], [address] [place], [place] [address]     Procedures/Studies: [facility] Chest Port 1 View  Result Date: 06/22/2022 CLINICAL DATA:  Short of breath EXAM: PORTABLE CHEST 1 VIEW COMPARISON:  None Available. FINDINGS: Normal mediastinum and cardiac silhouette. Normal pulmonary vasculature. No evidence of effusion, infiltrate, or pneumothorax. No acute bony abnormality. IMPRESSION: No acute cardiopulmonary process. Electronically Signed   By: Genevive Bi M.D.   On: 06/22/2022 13:06   CT Head Wo Contrast  Result Date: 06/22/2022 CLINICAL DATA:  Provided history: Mental status change, unknown cause. History of stroke. Left-sided weakness, facial droop, slurred speech. Increased generalized weakness. EXAM: CT HEAD WITHOUT CONTRAST  TECHNIQUE: Contiguous axial images were obtained from the base of the skull through the vertex without intravenous contrast. RADIATION DOSE REDUCTION: This exam was performed according to the departmental dose-optimization program which includes automated exposure control, adjustment of the mA and/or kV according to patient size and/or use of iterative reconstruction technique. COMPARISON:  Brain MRI 04/21/2022. Head CT 04/21/2022. FINDINGS: Brain: No age advanced or lobar predominant parenchymal atrophy. Chronic small-vessel infarcts within the right centrum semiovale, bilateral corona radiata and bilateral deep gray nuclei. Chronic lacunar infarcts within the pons. Small chronic infarcts within the bilateral cerebellar hemispheres. Some of these infarcts were better appreciated on the prior brain MRI 04/21/2022. Background mild patchy and ill-defined hypoattenuation within the cerebral white matter, nonspecific but compatible with chronic small vessel ischemic disease. There is no acute intracranial hemorrhage. No demarcated cortical infarct. No extra-axial fluid collection. No evidence of an intracranial mass. No midline shift. Vascular: No hyperdense vessel. Skull: No fracture or aggressive osseous lesion. Sinuses/Orbits: No mass or acute finding within the imaged orbits. Mild mucosal thickening, and small mucous retention cyst, within the right maxillary sinus. Mild mucosal thickening, and small-volume frothy secretions, within the left maxillary sinus. Mild mucosal thickening within the bilateral anterior ethmoid air cells and frontoethmoidal recesses. IMPRESSION: 1. No evidence of acute intracranial abnormality. 2. Multiple known chronic infarcts within the supratentorial and infratentorial brain, as detailed. 3. Paranasal sinus disease at the imaged levels, as described. Electronically Signed   By: Jackey Loge D.O.   On: 06/22/2022 11:58     Time coordinating discharge: Over 30 minutes    Lewie Chamber, MD  [facility] 06/24/2022, 5:39 PM

## 2022-06-26 DIAGNOSIS — R531 Weakness: Secondary | ICD-10-CM | POA: Diagnosis not present

## 2022-06-30 ENCOUNTER — Other Ambulatory Visit: Payer: Self-pay | Admitting: Nurse Practitioner

## 2022-06-30 ENCOUNTER — Encounter: Payer: Medicare Other | Admitting: Occupational Therapy

## 2022-06-30 NOTE — Telephone Encounter (Signed)
Requested medication (s) are due for refill today: yes  Requested medication (s) are on the active medication list: yes  Last refill:  04/23/22 #30 1 RF  Future visit scheduled: yes  Notes to clinic:  last 2 reorder was from hospital MD   Requested Prescriptions  Pending Prescriptions Disp Refills   losartan (COZAAR) 25 MG tablet [Pharmacy Med Name: LOSARTAN POTASSIUM 25 MG TAB] 30 tablet 1    Sig: TAKE 1 TABLET BY MOUTH DAILY     Cardiovascular:  Angiotensin Receptor Blockers Failed - 06/30/2022 12:05 PM      Failed - Cr in normal range and within 180 days    Creat  Date Value Ref Range Status  05/16/2020 1.38 (H) 0.70 - 1.25 mg/dL Final    Comment:    For patients >32 years of age, the reference limit for Creatinine is approximately 13% higher for people identified as African-American. .    Creatinine, Ser  Date Value Ref Range Status  06/24/2022 1.53 (H) 0.61 - 1.24 mg/dL Final   Creatinine, Urine  Date Value Ref Range Status  01/31/2019 96 20 - 320 mg/dL Final         Failed - Last BP in normal range    BP Readings from Last 1 Encounters:  06/24/22 (!) 147/86         Passed - K in normal range and within 180 days    Potassium  Date Value Ref Range Status  06/24/2022 3.6 3.5 - 5.1 mmol/L Final  03/15/2013 4.3 3.5 - 5.1 mmol/L Final         Passed - Patient is not pregnant      Passed - Valid encounter within last 6 months    Recent Outpatient Visits           3 weeks ago Recurrent strokes (HCC)   [facility] Larae Grooms, NP   1 month ago Recurrent strokes (HCC)   [facility] Mecum, Erin E, PA-C   2 months ago Hemiplegia and hemiparesis following cerebral infarction affecting left non-dominant side (HCC)   [facility] Mecum, Erin E, PA-C   5 months ago Pneumonia due to infectious organism, unspecified laterality, unspecified part of lung   [facility] Vigg, Avanti, MD   5 months ago Essential  hypertension   [facility] Vigg, Chief Operating Officer, MD       Future Appointments             In 1 month Lalla Brothers, Rossie Muskrat, MD [facility] [place]   In 2 months End, Cristal Deer, MD [facility] [place]. [facility]

## 2022-07-01 ENCOUNTER — Encounter: Payer: Medicare Other | Admitting: Occupational Therapy

## 2022-07-02 ENCOUNTER — Ambulatory Visit (INDEPENDENT_AMBULATORY_CARE_PROVIDER_SITE_OTHER): Payer: Medicare Other | Admitting: Nurse Practitioner

## 2022-07-02 ENCOUNTER — Encounter: Payer: Self-pay | Admitting: Nurse Practitioner

## 2022-07-02 VITALS — BP 162/80 | HR 53 | Temp 97.3°F

## 2022-07-02 DIAGNOSIS — I5032 Chronic diastolic (congestive) heart failure: Secondary | ICD-10-CM | POA: Diagnosis not present

## 2022-07-02 DIAGNOSIS — R062 Wheezing: Secondary | ICD-10-CM

## 2022-07-02 DIAGNOSIS — E1129 Type 2 diabetes mellitus with other diabetic kidney complication: Secondary | ICD-10-CM

## 2022-07-02 DIAGNOSIS — R001 Bradycardia, unspecified: Secondary | ICD-10-CM

## 2022-07-02 DIAGNOSIS — M25532 Pain in left wrist: Secondary | ICD-10-CM | POA: Diagnosis not present

## 2022-07-02 DIAGNOSIS — I639 Cerebral infarction, unspecified: Secondary | ICD-10-CM | POA: Diagnosis not present

## 2022-07-02 DIAGNOSIS — Z09 Encounter for follow-up examination after completed treatment for conditions other than malignant neoplasm: Secondary | ICD-10-CM

## 2022-07-02 DIAGNOSIS — N183 Chronic kidney disease, stage 3 unspecified: Secondary | ICD-10-CM

## 2022-07-02 DIAGNOSIS — M19032 Primary osteoarthritis, left wrist: Secondary | ICD-10-CM | POA: Diagnosis not present

## 2022-07-02 DIAGNOSIS — E1143 Type 2 diabetes mellitus with diabetic autonomic (poly)neuropathy: Secondary | ICD-10-CM | POA: Diagnosis not present

## 2022-07-02 DIAGNOSIS — E1122 Type 2 diabetes mellitus with diabetic chronic kidney disease: Secondary | ICD-10-CM

## 2022-07-02 MED ORDER — NITROGLYCERIN 0.4 MG SL SUBL: 0.4000 mg | SUBLINGUAL_TABLET | SUBLINGUAL | 0 refills | Status: AC | PRN

## 2022-07-02 MED ORDER — DAPAGLIFLOZIN PROPANEDIOL 10 MG PO TABS: 10.0000 mg | ORAL_TABLET | Freq: Every day | ORAL | 1 refills | Status: DC

## 2022-07-02 MED ORDER — ALBUTEROL SULFATE HFA 108 (90 BASE) MCG/ACT IN AERS: 2.0000 | INHALATION_SPRAY | Freq: Four times a day (QID) | RESPIRATORY_TRACT | 1 refills | Status: DC | PRN

## 2022-07-02 MED ORDER — METOCLOPRAMIDE HCL 5 MG PO TABS: 5.0000 mg | ORAL_TABLET | Freq: Three times a day (TID) | ORAL | 0 refills | Status: DC | PRN

## 2022-07-02 NOTE — Assessment & Plan Note (Signed)
Chronic.  Controlled.  Stopped Glipizide due to hypoglycemia.  Restart Evan Townsend.  Refill sent in today.  Discussed with wife during visit.  Return to clinic in 3 months for reevaluation.  Call sooner if concerns arise.

## 2022-07-02 NOTE — Progress Notes (Signed)
BP (!) 162/80   Pulse (!) 53   Temp (!) 97.3 F (36.3 C) (Oral)   SpO2 97%    Subjective:    Patient ID: Evan Anouncan E Tindol Sr., male    DOB: [birth year], 66 y.o.   MRN: [id number]  HPI: Evan AnoDuncan E Gonterman Sr. is a 66 y.o. male  Chief Complaint  Patient presents with   Hospitalization Follow-up   Cough    Onset when he was admitted to the ED per wife.    Medication Refill     Wife states pt needs refill for Nitroglycerin sublingual tabs, pt wife asking for refills on ALL meds    Transition of Care Hospital Follow up.   Hospital/Facility: [facility] D/C Physician: Dr. Camila LiGruigus D/C Date: 06/24/2022  Records Requested: NA Records Received: NA Records Reviewed: Yes  Diagnoses on Discharge:   Hypoglycemia DMII - A1c 6.8% -Suspect due to cumulative effect of Farxiga with glipizide -Given good control as evidenced by A1c, both medications discontinued -Could consider resuming Farxiga alone at follow-up   Acute kidney injury superimposed on chronic kidney disease 3b (HCC) -Creatinine 1.7 on 06/09/2022, baseline 1.4-1.7, presented with creatinine of 1.9 -Improved with fluid resuscitation - Home meds resumed at discharge    Sinus bradycardia -Appears to be a chronic issue with heart rates in the 30s to 40s in the past - EDP discussed with Dr. Kirke CorinArida of cardiology, who recommended to treat patient's hypoglycemia for now -Patient remained asymptomatic with ongoing mild sinus bradycardia -If becomes symptomatic, would warrant cardiology referral    Acute metabolic encephalopathy: - Likely due to hypoglycemia.  CT head negative. -Currently alert and oriented, following commands, appears close to his baseline   Chronic diastolic CHF (congestive heart failure) (HCC) - 2D echo on 04/22/2022 showed EF of 55 to 60%.    Hyperlipidemia LDL goal <70 -Continue Lipitor   Essential hypertension -Home regimen resumed at discharge   Atrial fibrillation, chronic (HCC) bradycardia -Currently has  bradycardia, not on any rate controlling meds -Continue eliquis   History of prior CVA with residual left-sided weakness -Continue Lipitor, eliquis   Hypokalemia -Replaced   Iron deficiency anemia -Hemoglobin 8.9 on admission, baseline 9-10, no reports of GI bleed -Continue iron supplement  Date of interactive Contact within 48 hours of discharge:  Contact was through:  no contact  Date of 7 day or 14 day face-to-face visit:    within 7 days  Outpatient Encounter Medications as of 07/02/2022  Medication Sig   amLODipine (NORVASC) 10 MG tablet TAKE 1 TABLET BY MOUTH DAILY   atorvastatin (LIPITOR) 80 MG tablet TAKE 1 TABLET BY MOUTH EVERY EVENING   dapagliflozin propanediol (FARXIGA) 10 MG TABS tablet Take 1 tablet (10 mg total) by mouth daily before breakfast.   ELIQUIS 5 MG TABS tablet TAKE 1 TABLET BY MOUTH TWICE A DAY   iron polysaccharides (NIFEREX) 150 MG capsule Take 1 capsule (150 mg total) by mouth daily.   losartan (COZAAR) 25 MG tablet TAKE 1 TABLET BY MOUTH DAILY   melatonin 1 MG TABS tablet Take 3 tablets (3 mg total) by mouth at bedtime as needed.   mirtazapine (REMERON) 7.5 MG tablet Take 1 tablet (7.5 mg total) by mouth at bedtime.   omeprazole (PRILOSEC) 40 MG capsule TAKE 1 CAPSULE BY MOUTH IN THE MORNING AND AT BEDTIME   tamsulosin (FLOMAX) 0.4 MG CAPS capsule Take 1 capsule (0.4 mg total) by mouth daily.   [DISCONTINUED] albuterol (VENTOLIN HFA) 108 (90 Base) MCG/ACT inhaler  Inhale 2 puffs into the lungs every 6 (six) hours as needed for wheezing or shortness of breath.   [DISCONTINUED] metoCLOPramide (REGLAN) 5 MG tablet TAKE 1 TABLET BY MOUTH 3 TIMES DAILY BEFORE MEALS   albuterol (VENTOLIN HFA) 108 (90 Base) MCG/ACT inhaler Inhale 2 puffs into the lungs every 6 (six) hours as needed for wheezing or shortness of breath.   metoCLOPramide (REGLAN) 5 MG tablet Take 1 tablet (5 mg total) by mouth every 8 (eight) hours as needed for nausea.   nitroGLYCERIN (NITROSTAT)  0.4 MG SL tablet Place 1 tablet (0.4 mg total) under the tongue every 5 (five) minutes as needed for chest pain.   [DISCONTINUED] losartan (COZAAR) 25 MG tablet Take 1 tablet (25 mg total) by mouth daily.   [DISCONTINUED] nitroGLYCERIN (NITROSTAT) 0.4 MG SL tablet Place 1 tablet (0.4 mg total) under the tongue every 5 (five) minutes as needed for chest pain. (Patient not taking: Reported on 07/02/2022)   No facility-administered encounter medications on file as of 07/02/2022.    Diagnostic Tests Reviewed/Disposition: Reviewed  Consults: Cardiology  Discharge Instructions: Reviewed  Disease/illness Education: Provided during visit  Home Health/Community Services Discussions/Referrals: NA  Establishment or re-establishment of referral orders for community resources: NA  Discussion with other health care providers: None  Assessment and Support of treatment regimen adherence: Assessed during visit  Appointments Coordinated with: Patient's Wife  Education for self-management, independent living, and ADLs: Needs assistance with all ADLs  Wife states patient is doing well.  He has been coughing.  Needs to know what diabetic medication patient should be taking since both were stopped   Relevant past medical, surgical, family and social history reviewed and updated as indicated. Interim medical history since our last visit reviewed. Allergies and medications reviewed and updated.  Review of Systems  Eyes:  Negative for visual disturbance.  Respiratory:  Positive for cough. Negative for shortness of breath.   Cardiovascular:  Negative for chest pain and leg swelling.  Neurological:  Negative for light-headedness and headaches.    Per HPI unless specifically indicated above     Objective:    BP (!) 162/80   Pulse (!) 53   Temp (!) 97.3 F (36.3 C) (Oral)   SpO2 97%   Wt Readings from Last 3 Encounters:  06/24/22 164 lb 12.8 oz (74.8 kg)  05/22/22 158 lb 4 oz (71.8 kg)   04/21/22 143 lb 8.3 oz (65.1 kg)    Physical Exam Vitals and nursing note reviewed.  Constitutional:      General: He is not in acute distress.    Appearance: Normal appearance. He is not ill-appearing, toxic-appearing or diaphoretic.  HENT:     Head: Normocephalic.     Right Ear: External ear normal.     Left Ear: External ear normal.     Nose: Nose normal. No congestion or rhinorrhea.     Mouth/Throat:     Mouth: Mucous membranes are moist.  Eyes:     General:        Right eye: No discharge.        Left eye: No discharge.     Extraocular Movements: Extraocular movements intact.     Conjunctiva/sclera: Conjunctivae normal.     Pupils: Pupils are equal, round, and reactive to light.  Cardiovascular:     Rate and Rhythm: Normal rate and regular rhythm.     Heart sounds: No murmur heard. Pulmonary:     Effort: Pulmonary effort is normal. No respiratory distress.  Breath sounds: Normal breath sounds. No wheezing, rhonchi or rales.  Abdominal:     General: Abdomen is flat. Bowel sounds are normal.  Musculoskeletal:     Cervical back: Normal range of motion and neck supple.     Comments: Wheel chair  Skin:    General: Skin is warm and dry.     Capillary Refill: Capillary refill takes less than 2 seconds.  Neurological:     General: No focal deficit present.     Mental Status: He is alert and oriented to person, place, and time.  Psychiatric:        Mood and Affect: Mood normal.        Behavior: Behavior normal.        Thought Content: Thought content normal.        Judgment: Judgment normal.     Results for orders placed or performed during the hospital encounter of 06/22/22  SARS Coronavirus 2 by RT PCR (hospital order, performed in [facility] hospital lab) *cepheid single result test* Anterior Nasal Swab   Specimen: Anterior Nasal Swab  Result Value Ref Range   SARS Coronavirus 2 by RT PCR NEGATIVE NEGATIVE  Comprehensive metabolic panel  Result Value Ref Range    Sodium 142 135 - 145 mmol/L   Potassium 3.6 3.5 - 5.1 mmol/L   Chloride 115 (H) 98 - 111 mmol/L   CO2 22 22 - 32 mmol/L   Glucose, Bld 54 (L) 70 - 99 mg/dL   BUN 29 (H) 8 - 23 mg/dL   Creatinine, Ser 6.94 (H) 0.61 - 1.24 mg/dL   Calcium 8.5 (L) 8.9 - 10.3 mg/dL   Total Protein 7.0 6.5 - 8.1 g/dL   Albumin 3.1 (L) 3.5 - 5.0 g/dL   AST 30 15 - 41 U/L   ALT 39 0 - 44 U/L   Alkaline Phosphatase 134 (H) 38 - 126 U/L   Total Bilirubin 0.5 0.3 - 1.2 mg/dL   GFR, Estimated 39 (L) >60 mL/min   Anion gap 5 5 - 15  CBC  Result Value Ref Range   WBC 4.0 4.0 - 10.5 K/uL   RBC 3.25 (L) 4.22 - 5.81 MIL/uL   Hemoglobin 8.9 (L) 13.0 - 17.0 g/dL   HCT 85.4 (L) 62.7 - 03.5 %   MCV 83.7 80.0 - 100.0 fL   MCH 27.4 26.0 - 34.0 pg   MCHC 32.7 30.0 - 36.0 g/dL   RDW 00.9 (H) 38.1 - 82.9 %   Platelets 187 150 - 400 K/uL   nRBC 0.0 0.0 - 0.2 %  Protime-INR - (order if patient is taking Coumadin / Warfarin)  Result Value Ref Range   Prothrombin Time 15.7 (H) 11.4 - 15.2 seconds   INR 1.3 (H) 0.8 - 1.2  Urinalysis, Routine w reflex microscopic Urine, Clean Catch  Result Value Ref Range   Color, Urine YELLOW (A) YELLOW   APPearance HAZY (A) CLEAR   Specific Gravity, Urine 1.012 1.005 - 1.030   pH 5.0 5.0 - 8.0   Glucose, UA >=500 (A) NEGATIVE mg/dL   Hgb urine dipstick SMALL (A) NEGATIVE   Bilirubin Urine NEGATIVE NEGATIVE   Ketones, ur NEGATIVE NEGATIVE mg/dL   Protein, ur >=937 (A) NEGATIVE mg/dL   Nitrite NEGATIVE NEGATIVE   Leukocytes,Ua NEGATIVE NEGATIVE   RBC / HPF 0-5 0 - 5 RBC/hpf   WBC, UA 6-10 0 - 5 WBC/hpf   Bacteria, UA MANY (A) NONE SEEN   Squamous Epithelial / LPF  0-5 0 - 5   Mucus PRESENT   TSH  Result Value Ref Range   TSH 1.095 0.350 - 4.500 uIU/mL  Brain natriuretic peptide  Result Value Ref Range   B Natriuretic Peptide 168.2 (H) 0.0 - 100.0 pg/mL  CBC  Result Value Ref Range   WBC 4.5 4.0 - 10.5 K/uL   RBC 3.10 (L) 4.22 - 5.81 MIL/uL   Hemoglobin 8.2 (L) 13.0 -  17.0 g/dL   HCT 94.1 (L) 74.0 - 81.4 %   MCV 82.6 80.0 - 100.0 fL   MCH 26.5 26.0 - 34.0 pg   MCHC 32.0 30.0 - 36.0 g/dL   RDW 48.1 (H) 85.6 - 31.4 %   Platelets 153 150 - 400 K/uL   nRBC 0.0 0.0 - 0.2 %  Basic metabolic panel  Result Value Ref Range   Sodium 143 135 - 145 mmol/L   Potassium 3.4 (L) 3.5 - 5.1 mmol/L   Chloride 119 (H) 98 - 111 mmol/L   CO2 21 (L) 22 - 32 mmol/L   Glucose, Bld 92 70 - 99 mg/dL   BUN 24 (H) 8 - 23 mg/dL   Creatinine, Ser 9.70 (H) 0.61 - 1.24 mg/dL   Calcium 8.2 (L) 8.9 - 10.3 mg/dL   GFR, Estimated 45 (L) >60 mL/min   Anion gap 3 (L) 5 - 15  Glucose, capillary  Result Value Ref Range   Glucose-Capillary 81 70 - 99 mg/dL  Glucose, capillary  Result Value Ref Range   Glucose-Capillary 90 70 - 99 mg/dL  Hemoglobin Y6V  Result Value Ref Range   Hgb A1c MFr Bld 6.8 (H) 4.8 - 5.6 %   Mean Plasma Glucose 148.46 mg/dL  Glucose, capillary  Result Value Ref Range   Glucose-Capillary 100 (H) 70 - 99 mg/dL  Glucose, capillary  Result Value Ref Range   Glucose-Capillary 83 70 - 99 mg/dL  Glucose, capillary  Result Value Ref Range   Glucose-Capillary 119 (H) 70 - 99 mg/dL  Basic metabolic panel  Result Value Ref Range   Sodium 143 135 - 145 mmol/L   Potassium 3.6 3.5 - 5.1 mmol/L   Chloride 118 (H) 98 - 111 mmol/L   CO2 22 22 - 32 mmol/L   Glucose, Bld 94 70 - 99 mg/dL   BUN 17 8 - 23 mg/dL   Creatinine, Ser 7.85 (H) 0.61 - 1.24 mg/dL   Calcium 8.6 (L) 8.9 - 10.3 mg/dL   GFR, Estimated 50 (L) >60 mL/min   Anion gap 3 (L) 5 - 15  Glucose, capillary  Result Value Ref Range   Glucose-Capillary 154 (H) 70 - 99 mg/dL  Glucose, capillary  Result Value Ref Range   Glucose-Capillary 98 70 - 99 mg/dL  Glucose, capillary  Result Value Ref Range   Glucose-Capillary 88 70 - 99 mg/dL   Comment 1 Notify RN   Glucose, capillary  Result Value Ref Range   Glucose-Capillary 94 70 - 99 mg/dL  Glucose, capillary  Result Value Ref Range    Glucose-Capillary 92 70 - 99 mg/dL   Comment 1 Notify RN   Glucose, capillary  Result Value Ref Range   Glucose-Capillary 87 70 - 99 mg/dL  Glucose, capillary  Result Value Ref Range   Glucose-Capillary 114 (H) 70 - 99 mg/dL  CBG monitoring, ED  Result Value Ref Range   Glucose-Capillary 39 (LL) 70 - 99 mg/dL   Comment 1 Repeat Test   CBG monitoring, ED  Result Value Ref  Range   Glucose-Capillary 39 (LL) 70 - 99 mg/dL   Comment 1 Call MD NNP PA CNM   CBG monitoring, ED  Result Value Ref Range   Glucose-Capillary 153 (H) 70 - 99 mg/dL  CBG monitoring, ED  Result Value Ref Range   Glucose-Capillary 133 (H) 70 - 99 mg/dL  CBG monitoring, ED  Result Value Ref Range   Glucose-Capillary 105 (H) 70 - 99 mg/dL  CBG monitoring, ED  Result Value Ref Range   Glucose-Capillary 98 70 - 99 mg/dL  CBG monitoring, ED  Result Value Ref Range   Glucose-Capillary 115 (H) 70 - 99 mg/dL      Assessment & Plan:   Problem List Items Addressed This Visit       Cardiovascular and Mediastinum   Recurrent strokes (HCC)    Referral placed for CCM.  Patient needs help with ADLs, wife is sole caregiver and works.  Has had frequent hospitalizations.      Relevant Medications   nitroGLYCERIN (NITROSTAT) 0.4 MG SL tablet   Other Relevant Orders   AMB Referral to Chronic Care Management Services   Chronic diastolic CHF (congestive heart failure) (HCC)   Relevant Medications   nitroGLYCERIN (NITROSTAT) 0.4 MG SL tablet     Endocrine   CKD stage 3 due to type 2 diabetes mellitus (HCC)   Relevant Medications   dapagliflozin propanediol (FARXIGA) 10 MG TABS tablet   Type II diabetes mellitus with renal manifestations (HCC)    Chronic.  Controlled.  Stopped Glipizide due to hypoglycemia.  Restart Marcelline Deist.  Refill sent in today.  Discussed with wife during visit.  Return to clinic in 3 months for reevaluation.  Call sooner if concerns arise.        Relevant Medications   dapagliflozin  propanediol (FARXIGA) 10 MG TABS tablet     Other   Bradycardia    New referral placed for patient to see Cardiology.  Encouraged wife to make an appt with Cardiologist.      Relevant Orders   Ambulatory referral to Cardiology   Other Visit Diagnoses     Hospital discharge follow-up    -  Primary   Doing well post hospitalization.  Discussed with spouse to make appt with cardiology. Referral placed for CCM due to frequent hospitalizations.   Diabetes mellitus with gastroparesis (HCC)       Relevant Medications   metoCLOPramide (REGLAN) 5 MG tablet   dapagliflozin propanediol (FARXIGA) 10 MG TABS tablet   Wheezing       Discussed proper use of Albuterol with patient and wife during visit.  Follow up if not improved.   Relevant Medications   albuterol (VENTOLIN HFA) 108 (90 Base) MCG/ACT inhaler        Follow up plan: No follow-ups on file.

## 2022-07-02 NOTE — Telephone Encounter (Signed)
Last refill:  04/23/22 #30 1 RF , has appt with you today.

## 2022-07-02 NOTE — Assessment & Plan Note (Signed)
New referral placed for patient to see Cardiology.  Encouraged wife to make an appt with Cardiologist.

## 2022-07-02 NOTE — Assessment & Plan Note (Signed)
Referral placed for CCM.  Patient needs help with ADLs, wife is sole caregiver and works.  Has had frequent hospitalizations.

## 2022-07-07 ENCOUNTER — Encounter: Payer: Medicare Other | Admitting: Occupational Therapy

## 2022-07-08 ENCOUNTER — Encounter: Payer: Medicare Other | Admitting: Occupational Therapy

## 2022-07-14 ENCOUNTER — Encounter: Payer: Medicare Other | Admitting: Occupational Therapy

## 2022-07-14 DIAGNOSIS — M542 Cervicalgia: Secondary | ICD-10-CM | POA: Diagnosis not present

## 2022-07-14 DIAGNOSIS — M792 Neuralgia and neuritis, unspecified: Secondary | ICD-10-CM | POA: Diagnosis not present

## 2022-07-15 ENCOUNTER — Encounter: Payer: Medicare Other | Admitting: Occupational Therapy

## 2022-07-15 ENCOUNTER — Telehealth: Payer: Self-pay | Admitting: *Deleted

## 2022-07-15 DIAGNOSIS — R531 Weakness: Secondary | ICD-10-CM | POA: Diagnosis not present

## 2022-07-15 NOTE — Progress Notes (Signed)
  Care Coordination   Note   07/15/2022 Name: Evan THROGMORTON Sr. MRN: [id number] DOB: 07-21-1956  Evan Ano Sr. is a 66 y.o. year old male who sees Larae Grooms, NP for primary care. I reached out to [employer] Sr[employer] by phone today to offer care coordination services.  [name] was given information about Care Coordination services today including:   The Care Coordination services include support from the care team which includes your Nurse Coordinator, Clinical Social Worker, or Pharmacist.  The Care Coordination team is here to help remove barriers to the health concerns and goals most important to you. Care Coordination services are voluntary, and the patient may decline or stop services at any time by request to their care team member.   Care Coordination Consent Status: Patient agreed to services and verbal consent obtained.   Follow up plan:  Telephone appointment with care coordination team member scheduled for:  07/27/2022  Encounter Outcome:  Pt. Scheduled from referral   Burman Nieves, [facility] Care Coordination Care Guide Direct Dial: [phone]

## 2022-07-16 ENCOUNTER — Ambulatory Visit: Payer: Self-pay

## 2022-07-16 NOTE — Telephone Encounter (Signed)
The spouse called in stating the patient has had a bad cough and now  has blood in his urine. Please assist patient further     Chief Complaint: Bright red blood in urine Symptoms: Blood, burning with urination, back pain Frequency: Yesterday Pertinent Negatives: Patient denies fever Disposition: [] ED /[] Urgent Care (no appt availability in office) / [x] Appointment(In office/virtual)/ []  [place] [facility]/ [] Home Care/ [] Refused Recommended Disposition /[] [place] [facility]/ []  Follow-up with PCP Additional Notes: Will go to ED with worsening of symptoms.  Reason for Disposition  Pain or burning with passing urine  Answer Assessment - Initial Assessment Questions 1. COLOR of URINE: "Describe the color of the urine."  (e.g., tea-colored, pink, red, bloody) "Do you have blood clots in your urine?" (e.g., none, pea, grape, small coin)     Bright red 2. ONSET: "When did the bleeding start?"      Today 3. EPISODES: "How many times has there been blood in the urine?" or "How many times today?"      X 1 4. PAIN with URINATION: "Is there any pain with passing your urine?" If Yes, ask: "How bad is the pain?"  (Scale 1-10; or mild, moderate, severe)    - MILD: Complains slightly about urination hurting.    - MODERATE: Interferes with normal activities.      - SEVERE: Excruciating, unwilling or unable to urinate because of the pain.      Moderate 5. FEVER: "Do you have a fever?" If Yes, ask: "What is your temperature, how was it measured, and when did it start?"     No 6. ASSOCIATED SYMPTOMS: "Are you passing urine more frequently than usual?"     No 7. OTHER SYMPTOMS: "Do you have any other symptoms?" (e.g., back/flank pain, abdomen pain, vomiting)     Back 8. PREGNANCY: "Is there any chance you are pregnant?" "When was your last menstrual period?"     N/a  Protocols used: Urine - Blood In-A-AH

## 2022-07-17 ENCOUNTER — Ambulatory Visit (INDEPENDENT_AMBULATORY_CARE_PROVIDER_SITE_OTHER): Payer: Medicare Other | Admitting: Nurse Practitioner

## 2022-07-17 ENCOUNTER — Encounter: Payer: Self-pay | Admitting: Nurse Practitioner

## 2022-07-17 VITALS — BP 147/72 | HR 53

## 2022-07-17 DIAGNOSIS — N3001 Acute cystitis with hematuria: Secondary | ICD-10-CM

## 2022-07-17 DIAGNOSIS — R319 Hematuria, unspecified: Secondary | ICD-10-CM | POA: Diagnosis not present

## 2022-07-17 DIAGNOSIS — R300 Dysuria: Secondary | ICD-10-CM | POA: Diagnosis not present

## 2022-07-17 DIAGNOSIS — R062 Wheezing: Secondary | ICD-10-CM

## 2022-07-17 DIAGNOSIS — T17908D Unspecified foreign body in respiratory tract, part unspecified causing other injury, subsequent encounter: Secondary | ICD-10-CM | POA: Diagnosis not present

## 2022-07-17 LAB — URINALYSIS, ROUTINE W REFLEX MICROSCOPIC
Bilirubin, UA: NEGATIVE
Ketones, UA: NEGATIVE
Nitrite, UA: NEGATIVE
Specific Gravity, UA: 1.02 (ref 1.005–1.030)
Urobilinogen, Ur: 0.2 mg/dL (ref 0.2–1.0)
pH, UA: 6 (ref 5.0–7.5)

## 2022-07-17 LAB — MICROSCOPIC EXAMINATION: Bacteria, UA: NONE SEEN

## 2022-07-17 MED ORDER — AMOXICILLIN 500 MG PO CAPS: 500.0000 mg | ORAL_CAPSULE | Freq: Two times a day (BID) | ORAL | 0 refills | Status: AC

## 2022-07-17 MED ORDER — ALBUTEROL SULFATE HFA 108 (90 BASE) MCG/ACT IN AERS: 2.0000 | INHALATION_SPRAY | RESPIRATORY_TRACT | 1 refills | Status: AC | PRN

## 2022-07-17 NOTE — Progress Notes (Signed)
BP (!) 147/72   Pulse (!) 53   SpO2 99%    Subjective:    Patient ID: Evan Ano Sr., male    DOB: 08/23/1955, 66 y.o.   MRN: [id number]  HPI: Evan PFALZGRAF Sr. is a 66 y.o. male  Chief Complaint  Patient presents with   Urinary Tract Infection   URINARY SYMPTOMS Symptoms ongoing x 2 days. Dysuria: burning Urinary frequency: yes Urgency: yes Small volume voids: no Symptom severity: no Urinary incontinence: yes Foul odor: no Hematuria: yes Abdominal pain: no Back pain: no Suprapubic pain/pressure: no Flank pain: no Fever:  no Vomiting: no  Patient is coughing whenever he is drinking.  Wife is giving thickener with any liquids but it doesn't seem to be helping with symptoms.    Relevant past medical, surgical, family and social history reviewed and updated as indicated. Interim medical history since our last visit reviewed. Allergies and medications reviewed and updated.  Review of Systems  Constitutional:  Negative for fatigue.  Respiratory:  Positive for cough.   Gastrointestinal:  Negative for abdominal pain.  Genitourinary:  Positive for dysuria, frequency and hematuria. Negative for flank pain.  Musculoskeletal:  Negative for back pain.    Per HPI unless specifically indicated above     Objective:    BP (!) 147/72   Pulse (!) 53   SpO2 99%   Wt Readings from Last 3 Encounters:  06/24/22 164 lb 12.8 oz (74.8 kg)  05/22/22 158 lb 4 oz (71.8 kg)  04/21/22 143 lb 8.3 oz (65.1 kg)    Physical Exam Vitals and nursing note reviewed.  Constitutional:      General: He is not in acute distress.    Appearance: Normal appearance. He is not ill-appearing, toxic-appearing or diaphoretic.  HENT:     Head: Normocephalic.     Right Ear: External ear normal.     Left Ear: External ear normal.     Nose: Nose normal. No congestion or rhinorrhea.     Mouth/Throat:     Mouth: Mucous membranes are moist.  Eyes:     General:        Right eye: No discharge.         Left eye: No discharge.     Extraocular Movements: Extraocular movements intact.     Conjunctiva/sclera: Conjunctivae normal.     Pupils: Pupils are equal, round, and reactive to light.  Cardiovascular:     Rate and Rhythm: Normal rate and regular rhythm.     Heart sounds: No murmur heard. Pulmonary:     Effort: Pulmonary effort is normal. No respiratory distress.     Breath sounds: Wheezing present. No rhonchi or rales.  Abdominal:     General: Abdomen is flat. Bowel sounds are normal. There is no distension.     Tenderness: There is no abdominal tenderness. There is no right CVA tenderness, left CVA tenderness or guarding.  Musculoskeletal:     Cervical back: Normal range of motion and neck supple.  Skin:    General: Skin is warm and dry.     Capillary Refill: Capillary refill takes less than 2 seconds.  Neurological:     General: No focal deficit present.     Mental Status: He is alert and oriented to person, place, and time.  Psychiatric:        Mood and Affect: Mood normal.        Behavior: Behavior normal.  Thought Content: Thought content normal.        Judgment: Judgment normal.     Results for orders placed or performed in visit on 07/17/22  Microscopic Examination   Urine  Result Value Ref Range   WBC, UA 11-30 (A) 0 - 5 /hpf   RBC, Urine 11-30 (A) 0 - 2 /hpf   Epithelial Cells (non renal) 0-10 0 - 10 /hpf   Bacteria, UA None seen None seen/Few  Urinalysis, Routine w reflex microscopic  Result Value Ref Range   Specific Gravity, UA 1.020 1.005 - 1.030   pH, UA 6.0 5.0 - 7.5   Color, UA Yellow Yellow   Appearance Ur Turbid (A) Clear   Leukocytes,UA 1+ (A) Negative   Protein,UA 3+ (A) Negative/Trace   Glucose, UA 1+ (A) Negative   Ketones, UA Negative Negative   RBC, UA 3+ (A) Negative   Bilirubin, UA Negative Negative   Urobilinogen, Ur 0.2 0.2 - 1.0 mg/dL   Nitrite, UA Negative Negative   Microscopic Examination See below:       Assessment  & Plan:   Problem List Items Addressed This Visit   None Visit Diagnoses     Acute cystitis with hematuria    -  Primary   Will treat with Amoxicillin.  Complete course of antibiotics. Will send urine for culture.  Follow up if not improved.   Relevant Orders   Urine Culture   Dysuria       Relevant Orders   Urinalysis, Routine w reflex microscopic (Completed)   Wheezing       Discussed proper use of Albuterol with patient and wife during visit.  Follow up if not improved.   Relevant Medications   albuterol (VENTOLIN HFA) 108 (90 Base) MCG/ACT inhaler   Aspiration into airway, subsequent encounter       Referral placed for home health for swallow test evaluation.   Relevant Orders   Ambulatory referral to Home Health        Follow up plan: No follow-ups on file.

## 2022-07-17 NOTE — Progress Notes (Signed)
Results discussed during visit

## 2022-07-19 LAB — URINE CULTURE

## 2022-07-20 NOTE — Addendum Note (Signed)
Addended by: Larae Grooms on: 07/20/2022 09:27 AM   Modules accepted: Orders

## 2022-07-20 NOTE — Progress Notes (Signed)
Please let patient's wife know that there was no bacterial growth on his culture.  If his symptoms are not improved, I will place a referral to Urology.

## 2022-07-20 NOTE — Progress Notes (Signed)
Referral placed.

## 2022-07-21 ENCOUNTER — Encounter: Payer: Medicare Other | Admitting: Occupational Therapy

## 2022-07-22 ENCOUNTER — Encounter: Payer: Medicare Other | Admitting: Occupational Therapy

## 2022-07-22 ENCOUNTER — Encounter: Payer: Medicare Other | Admitting: Speech Pathology

## 2022-07-23 DIAGNOSIS — Z9181 History of falling: Secondary | ICD-10-CM | POA: Diagnosis not present

## 2022-07-23 DIAGNOSIS — N3001 Acute cystitis with hematuria: Secondary | ICD-10-CM | POA: Diagnosis not present

## 2022-07-23 DIAGNOSIS — T17908D Unspecified foreign body in respiratory tract, part unspecified causing other injury, subsequent encounter: Secondary | ICD-10-CM | POA: Diagnosis not present

## 2022-07-23 DIAGNOSIS — Z7984 Long term (current) use of oral hypoglycemic drugs: Secondary | ICD-10-CM | POA: Diagnosis not present

## 2022-07-23 DIAGNOSIS — E119 Type 2 diabetes mellitus without complications: Secondary | ICD-10-CM | POA: Diagnosis not present

## 2022-07-23 DIAGNOSIS — Z7901 Long term (current) use of anticoagulants: Secondary | ICD-10-CM | POA: Diagnosis not present

## 2022-07-24 DIAGNOSIS — T17908D Unspecified foreign body in respiratory tract, part unspecified causing other injury, subsequent encounter: Secondary | ICD-10-CM | POA: Diagnosis not present

## 2022-07-24 DIAGNOSIS — E119 Type 2 diabetes mellitus without complications: Secondary | ICD-10-CM | POA: Diagnosis not present

## 2022-07-24 DIAGNOSIS — Z9181 History of falling: Secondary | ICD-10-CM | POA: Diagnosis not present

## 2022-07-24 DIAGNOSIS — N3001 Acute cystitis with hematuria: Secondary | ICD-10-CM | POA: Diagnosis not present

## 2022-07-24 DIAGNOSIS — Z7901 Long term (current) use of anticoagulants: Secondary | ICD-10-CM | POA: Diagnosis not present

## 2022-07-24 DIAGNOSIS — Z7984 Long term (current) use of oral hypoglycemic drugs: Secondary | ICD-10-CM | POA: Diagnosis not present

## 2022-07-26 DIAGNOSIS — R531 Weakness: Secondary | ICD-10-CM | POA: Diagnosis not present

## 2022-07-27 ENCOUNTER — Ambulatory Visit: Payer: Self-pay | Admitting: *Deleted

## 2022-07-27 ENCOUNTER — Encounter: Payer: Self-pay | Admitting: *Deleted

## 2022-07-27 DIAGNOSIS — I4891 Unspecified atrial fibrillation: Secondary | ICD-10-CM

## 2022-07-27 NOTE — Patient Outreach (Signed)
  Care Coordination   Initial Visit Note   07/27/2022 Name: Evan KAMPE Sr. MRN: [id number] DOB: 1955/10/22  Evan Ano Sr. is a 66 y.o. year old male who sees Evan Grooms, Evan Townsend for primary care. I spoke with Evan Townsend, wife of Evan BEEM Sr. by phone today.  What matters to the patients health and wellness today?  Per wife, patient was seen at PCP office on 12/1 for complaints of blood in urine.  Provided with antibiotics, no bacteria identified on urine test.   Also interested in getting paid to care for patient.  He is currently active with CAPS.     Goals Addressed             This Visit's Progress    Management of chronic medical conditions       Care Coordination Interventions: Evaluation of current treatment plan related to A-fib, residual from stroke, cystitis and patient's adherence to plan as established by provider Advised patient to notify provider if she finds more blood in urine Collaborated with CSW regarding wife getting paid through [employer] to care for patient Reviewed scheduled/upcoming provider appointments including urology visit on 12/18, cardiology visit on 12/20, PCP "welcome to medicare" visit on 12/27 Care Guide referral for difficulty paying utility bills Discussed plans with patient for ongoing care management follow up and provided patient with direct contact information for care management team Screening for signs and symptoms of depression related to chronic disease state  Assessed social determinant of health barriers         SDOH assessments and interventions completed:  Yes  SDOH Interventions Today    Flowsheet Row Most Recent Value  SDOH Interventions   Food Insecurity Interventions Intervention Not Indicated  Housing Interventions Intervention Not Indicated  Transportation Interventions Intervention Not Indicated  Utilities Interventions Other (Comment)  [referral to community resource care guide]        Care Coordination  Interventions:  Yes, provided   Follow up plan: Follow up call scheduled for 1/8    Encounter Outcome:  Pt. Visit Completed   Kemper Durie, RN, MSN, [facility] Care Management Care Management Coordinator [phone]

## 2022-07-27 NOTE — Patient Outreach (Signed)
  Care Coordination   Initial Visit Note   07/27/2022 Name: Evan PEGGS Sr. MRN: [id number] DOB: June 21, 1956  Evan Ano Sr. is a 66 y.o. year old male who sees Larae Grooms, NP for primary care. I spoke with  Evan Ano Sr. by phone today.  What matters to the patients health and wellness today?  Paid caregiver resources    Goals Addressed             This Visit's Progress    Per spouse "I would like to be paid to care for my husband"       Care Coordination Interventions: Patient's spouse confirms that she is a caregiver for [employer] and would like to be paid to provide care for her spouse Per patient's spouse, patient has managed medicaid as well as [facility] Medicare Per patient's spouse, patient is currently receiving personal care services (18 hours per month) however he is more comfortable with her care Patient has applied for [employer]S, however determination pending Collaboration phone call to [employer] to follow up with CAPS referral, however VM message had to be left for a return call This social worker will follow up with [employer] regarding status of CAPS application and to confirm Medicaid coverage          SDOH assessments and interventions completed:  Yes  SDOH Interventions Today    Flowsheet Row Most Recent Value  SDOH Interventions   Food Insecurity Interventions Intervention Not Indicated  Housing Interventions Intervention Not Indicated  Transportation Interventions Intervention Not Indicated        Care Coordination Interventions:  Yes, provided   Follow up plan: Follow up call scheduled for 07/28/22    Encounter Outcome:  Pt. Visit Completed

## 2022-07-27 NOTE — Patient Instructions (Signed)
Visit Information  Thank you for taking time to visit with me today. Please don't hesitate to contact me if I can be of assistance to you.   Following are the goals we discussed today:   Goals Addressed             This Visit's Progress    Per spouse "I would like to be paid to care for my husband"       Care Coordination Interventions: Patient's spouse confirms that she is a caregiver for [employer] and would like to be paid to provide care for her spouse Per patient's spouse, patient has managed medicaid as well as [facility] Medicare Per patient's spouse, patient is currently receiving personal care services (18 hours per month) however he is more comfortable with her care Patient has applied for CAPS, however determination pending Collaboration phone call to [employer] to follow up with CAPS referral, however VM message had to be left for a return call This social worker will follow up with [employer] regarding status of CAPS application and to confirm Medicaid coverage          Our next appointment is by telephone on 07/28/22 at 2pm  Please call the care guide team at [phone] if you need to cancel or reschedule your appointment.   If you are experiencing a Mental Health or Behavioral Health Crisis or need someone to talk to, please call 911   Patient verbalizes understanding of instructions and care plan provided today and agrees to view in MyChart. Active MyChart status and patient understanding of how to access instructions and care plan via MyChart confirmed with patient.     Telephone follow up appointment with care management team member scheduled for: 07/28/22  Verna Czech, LCSW Clinical Social Worker  [facility] Care Management [phone]

## 2022-07-28 ENCOUNTER — Encounter: Payer: Medicare Other | Admitting: Occupational Therapy

## 2022-07-28 ENCOUNTER — Telehealth: Payer: Self-pay | Admitting: *Deleted

## 2022-07-28 ENCOUNTER — Encounter: Payer: Medicare Other | Admitting: Speech Pathology

## 2022-07-28 ENCOUNTER — Ambulatory Visit: Payer: Self-pay | Admitting: *Deleted

## 2022-07-28 NOTE — Patient Outreach (Signed)
  Care Coordination   Follow Up Visit Note   07/28/2022 Name: Evan HAMBERGER Sr. MRN: [id number] DOB: [birth year]  Evan Ano Sr. is a 66 y.o. year old male who sees Larae Grooms, NP for primary care. I  spoke with [employer] regarding status of patient's CAPS application  What matters to the patients health and wellness today?  CAPS services    Goals Addressed             This Visit's Progress    Per spouse "I would like to be paid to care for my husband"       Care Coordination Interventions: Collaboration phone call to [employer] to follow up with CAPS referral, application received and is ready for nursing assessment to be scheduled Phone call to patient's spouse, left VM requesting a return call to provide the information obtained          SDOH assessments and interventions completed:  No     Care Coordination Interventions:  Yes, provided   Follow up plan: Follow up call scheduled for 08/07/22    Encounter Outcome:  Pt. Visit Completed

## 2022-07-28 NOTE — Patient Instructions (Signed)
Visit Information  Thank you for taking time to visit with me today. Please don't hesitate to contact me if I can be of assistance to you.   Following are the goals we discussed today:   Goals Addressed             This Visit's Progress    Per spouse "I would like to be paid to care for my husband"       Care Coordination Interventions: Collaboration phone call to [employer] to follow up with CAPS referral, application received and is ready for nursing assessment to be scheduled Phone call to patient's spouse, attempt made to contact [employer] to schedule the nursing assessment, VM left for a return call Contact information also provided to patient's spouse to contact [employer] as well to schedule [phone]          Our next appointment is by telephone on 08/11/22 at 2pm  Please call the care guide team at [phone] if you need to cancel or reschedule your appointment.   If you are experiencing a Mental Health or Behavioral Health Crisis or need someone to talk to, please call 911   Patient verbalizes understanding of instructions and care plan provided today and agrees to view in MyChart. Active MyChart status and patient understanding of how to access instructions and care plan via MyChart confirmed with patient.     Telephone follow up appointment with care management team member scheduled for: 08/11/22  Verna Czech, LCSW Clinical Social Worker  [facility] Care Management [phone]

## 2022-07-28 NOTE — Patient Outreach (Signed)
  Care Coordination   Follow Up Visit Note   07/28/2022 Name: Evan DELLAROCCO Sr. MRN: [id number] DOB: 1955-12-01  Evan Ano Sr. is a 66 y.o. year old male who sees Larae Grooms, NP for primary care. I spoke with  Evan Ano Sr. 's spouse by phone today.  What matters to the patients health and wellness today?  [employer] Program approval    Goals Addressed             This Visit's Progress    Per spouse "I would like to be paid to care for my husband"       Care Coordination Interventions: Collaboration phone call to [employer] to follow up with CAPS referral, application received and is ready for nursing assessment to be scheduled Phone call to patient's spouse, attempt made to contact [employer] to schedule the nursing assessment, VM left for a return call Contact information also provided to patient's spouse to contact [employer] as well to schedule [phone]          SDOH assessments and interventions completed:  No     Care Coordination Interventions:  Yes, provided   Follow up plan: Follow up call scheduled for 08/11/22    Encounter Outcome:  Pt. Visit Completed

## 2022-07-29 ENCOUNTER — Telehealth: Payer: Self-pay | Admitting: *Deleted

## 2022-07-29 ENCOUNTER — Encounter: Payer: Medicare Other | Admitting: Occupational Therapy

## 2022-07-29 ENCOUNTER — Encounter: Payer: Medicare Other | Admitting: Speech Pathology

## 2022-07-29 NOTE — Telephone Encounter (Signed)
Analisa,RN Rolene [facility]   Will be Requesting Home Health Orders: Nursing ,speech and physical therapy- expect request for orders

## 2022-07-30 NOTE — Telephone Encounter (Signed)
FYI. Will place forms in providers folder once received.

## 2022-07-30 NOTE — Telephone Encounter (Signed)
Okay to give verbal order.

## 2022-07-31 DIAGNOSIS — T17908D Unspecified foreign body in respiratory tract, part unspecified causing other injury, subsequent encounter: Secondary | ICD-10-CM | POA: Diagnosis not present

## 2022-07-31 DIAGNOSIS — Z7984 Long term (current) use of oral hypoglycemic drugs: Secondary | ICD-10-CM | POA: Diagnosis not present

## 2022-07-31 DIAGNOSIS — E119 Type 2 diabetes mellitus without complications: Secondary | ICD-10-CM | POA: Diagnosis not present

## 2022-07-31 DIAGNOSIS — N3001 Acute cystitis with hematuria: Secondary | ICD-10-CM | POA: Diagnosis not present

## 2022-07-31 DIAGNOSIS — Z7901 Long term (current) use of anticoagulants: Secondary | ICD-10-CM | POA: Diagnosis not present

## 2022-07-31 DIAGNOSIS — Z9181 History of falling: Secondary | ICD-10-CM | POA: Diagnosis not present

## 2022-08-03 ENCOUNTER — Ambulatory Visit: Payer: Medicare Other | Admitting: Urology

## 2022-08-04 ENCOUNTER — Ambulatory Visit: Payer: Medicare Other

## 2022-08-04 ENCOUNTER — Encounter: Payer: Medicare Other | Admitting: Speech Pathology

## 2022-08-04 ENCOUNTER — Other Ambulatory Visit: Payer: Self-pay | Admitting: Nurse Practitioner

## 2022-08-04 ENCOUNTER — Other Ambulatory Visit: Payer: Self-pay | Admitting: Physician Assistant

## 2022-08-04 ENCOUNTER — Encounter: Payer: Medicare Other | Admitting: Occupational Therapy

## 2022-08-04 DIAGNOSIS — G4700 Insomnia, unspecified: Secondary | ICD-10-CM

## 2022-08-04 DIAGNOSIS — N3001 Acute cystitis with hematuria: Secondary | ICD-10-CM | POA: Diagnosis not present

## 2022-08-04 DIAGNOSIS — E119 Type 2 diabetes mellitus without complications: Secondary | ICD-10-CM | POA: Diagnosis not present

## 2022-08-04 DIAGNOSIS — Z7984 Long term (current) use of oral hypoglycemic drugs: Secondary | ICD-10-CM | POA: Diagnosis not present

## 2022-08-04 DIAGNOSIS — Z7901 Long term (current) use of anticoagulants: Secondary | ICD-10-CM | POA: Diagnosis not present

## 2022-08-04 DIAGNOSIS — T17908D Unspecified foreign body in respiratory tract, part unspecified causing other injury, subsequent encounter: Secondary | ICD-10-CM | POA: Diagnosis not present

## 2022-08-04 DIAGNOSIS — Z9181 History of falling: Secondary | ICD-10-CM | POA: Diagnosis not present

## 2022-08-05 ENCOUNTER — Encounter: Payer: Self-pay | Admitting: Cardiology

## 2022-08-05 ENCOUNTER — Ambulatory Visit: Payer: Medicare Other | Attending: Cardiology | Admitting: Cardiology

## 2022-08-05 DIAGNOSIS — Z7984 Long term (current) use of oral hypoglycemic drugs: Secondary | ICD-10-CM | POA: Diagnosis not present

## 2022-08-05 DIAGNOSIS — Z7901 Long term (current) use of anticoagulants: Secondary | ICD-10-CM | POA: Diagnosis not present

## 2022-08-05 DIAGNOSIS — E119 Type 2 diabetes mellitus without complications: Secondary | ICD-10-CM | POA: Diagnosis not present

## 2022-08-05 DIAGNOSIS — T17908D Unspecified foreign body in respiratory tract, part unspecified causing other injury, subsequent encounter: Secondary | ICD-10-CM | POA: Diagnosis not present

## 2022-08-05 DIAGNOSIS — Z9181 History of falling: Secondary | ICD-10-CM | POA: Diagnosis not present

## 2022-08-05 DIAGNOSIS — N3001 Acute cystitis with hematuria: Secondary | ICD-10-CM | POA: Diagnosis not present

## 2022-08-05 NOTE — Telephone Encounter (Signed)
Requested Prescriptions  Pending Prescriptions Disp Refills   losartan (COZAAR) 25 MG tablet [Pharmacy Med Name: LOSARTAN POTASSIUM 25 MG TAB] 90 tablet 0    Sig: TAKE 1 TABLET BY MOUTH DAILY     Cardiovascular:  Angiotensin Receptor Blockers Failed - 08/04/2022  4:33 PM      Failed - Cr in normal range and within 180 days    Creat  Date Value Ref Range Status  05/16/2020 1.38 (H) 0.70 - 1.25 mg/dL Final    Comment:    For patients >65 years of age, the reference limit for Creatinine is approximately 13% higher for people identified as African-American. .    Creatinine, Ser  Date Value Ref Range Status  06/24/2022 1.53 (H) 0.61 - 1.24 mg/dL Final   Creatinine, Urine  Date Value Ref Range Status  01/31/2019 96 20 - 320 mg/dL Final         Failed - Last BP in normal range    BP Readings from Last 1 Encounters:  07/17/22 (!) 147/72         Passed - K in normal range and within 180 days    Potassium  Date Value Ref Range Status  06/24/2022 3.6 3.5 - 5.1 mmol/L Final  03/15/2013 4.3 3.5 - 5.1 mmol/L Final         Passed - Patient is not pregnant      Passed - Valid encounter within last 6 months    Recent Outpatient Visits           2 weeks ago Acute cystitis with hematuria   [facility] Larae Grooms, NP   1 month ago Hospital discharge follow-up   [facility] Larae Grooms, NP   1 month ago Recurrent strokes [facility])   [facility] Larae Grooms, NP   3 months ago Recurrent strokes (HCC)   [facility] Mecum, Erin E, PA-C   4 months ago Hemiplegia and hemiparesis following cerebral infarction affecting left non-dominant side (HCC)   [facility] Mecum, Oswaldo Conroy, PA-C       Future Appointments             Today Lanier Prude, MD [facility] A Dept Of [place]. [facility]   In 3 weeks Richardo Hanks, Laurette Schimke, MD [facility] [place]   In 4 weeks End,  Cristal Deer, MD [facility] A Dept Of [place]. [facility]

## 2022-08-05 NOTE — Progress Notes (Deleted)
Electrophysiology Office Follow up Visit Note:    Date:  08/05/2022   ID:  Evan Ano Sr., DOB May 27, 1956, MRN [id number]  PCP:  Larae Grooms, NP  [facility] HeartCare Cardiologist:  [name], MD  [facility] HeartCare Electrophysiologist:  Lanier Prude, MD    Interval History:    Evan DUBUQUE Sr. is a 66 y.o. male who presents for a follow up visit. They were last seen in clinic January 28, 2022 after hospitalization in May 2023.  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  He wore a ZIO monitor in July which showed good chronotropic competence with a max heart rate in the 130s.  He presents today for follow-up.       Past Medical History:  Diagnosis Date   Acute CVA (cerebrovascular accident) (HCC) 06/05/2021   Acute ischemic stroke (HCC) 04/22/2022   CAP (community acquired pneumonia) 11/26/2021   Carotid arterial disease (HCC)    a. 08/2018 Carotid U/S: min-mod RICA atherosclerosis w/o hemodynamically significant stenosis. Nl LICA.   Diabetes 1.5, managed as type 2 (HCC)    Diastolic dysfunction    a. 08/2018 Echo: EF 65%. No rwma. Gr1 DD. Mild MR.   Diastolic dysfunction    a. 08/2019 Echo: EF 55-60%, Gr1 DD. No rwma. Mild MR. RVSP 37.53mmHg.   Hypercholesterolemia    Hypertension    PAF (paroxysmal atrial fibrillation) (HCC)    a. 10/2019 Event Monitor: PAF; b. CHA2DS2VASc = 5-->Eliquis.   Poorly controlled diabetes mellitus (HCC)    a. 04/2019 A1c 13.8.   Recurrent strokes (HCC)    a. 10/2016 MRI/A: Acute 69mm R thalamic infarct, ? subacute infarct of R corona radiata; b. 08/2017 MRI/A: Acute 53mm lateral L thalamic infarct. Other more remote lacunar infarcts of thalami bilat. Small vessel dzs; c. 08/2018 MRI/A: Acute lacunar infarct of the post limb of R internal capsule; d. 08/2019 MRI Acute CVA of L paramedian pons adn R cerebellar hemisphere.   Sepsis due to pneumonia (HCC) 11/26/2021   Tobacco abuse      Past Surgical History:  Procedure Laterality Date   ESOPHAGOGASTRODUODENOSCOPY (EGD) WITH PROPOFOL N/A 04/26/2019   Procedure: ESOPHAGOGASTRODUODENOSCOPY (EGD) WITH PROPOFOL;  Surgeon: Toney Reil, MD;  Location: [facility] ENDOSCOPY;  Service: Gastroenterology;  Laterality: N/A;   LEFT HEART CATH AND CORONARY ANGIOGRAPHY N/A 01/09/2022   Procedure: LEFT HEART CATH AND CORONARY ANGIOGRAPHY;  Surgeon: [name], MD;  Location: [facility] INVASIVE CV LAB;  Service: Cardiovascular;  Laterality: N/A;   NO PAST SURGERIES     TEE WITHOUT CARDIOVERSION N/A 04/22/2022   Procedure: TRANSESOPHAGEAL ECHOCARDIOGRAM (TEE);  Surgeon: Debbe Odea, MD;  Location: [facility] ORS;  Service: Cardiovascular;  Laterality: N/A;    Current Medications: No outpatient medications have been marked as taking for the 08/05/22 encounter (Appointment) with Lanier Prude, MD.     Allergies:   Patient has no known allergies.   Social History   Socioeconomic History   Marital status: Married    Spouse name: Solicitor   Number of children: 5   Years of education: Not on file   Highest education level: Not on file  Occupational History   Occupation: unemployed  Tobacco Use   Smoking status: Former    Packs/day: 1.00    Years: 38.00    Total pack years: 38.00    Types: Cigarettes    Start date: 1982   Smokeless tobacco: Never  Advertising account planner  Vaping Use: Never used  Substance and Sexual Activity   Alcohol use: Not Currently    Alcohol/week: 1.0 standard drink of alcohol    Types: 1 Cans of beer per week    Comment: previously drank but nothing in 1-2 yrs (08/2019).   Drug use: Not Currently    Types: Cocaine, Marijuana    Comment: prev used cocaine/marijuana but none x 1-2 yrs (08/2019).   Sexual activity: Yes  Other Topics Concern   Not on file  Social History Narrative   Lives in [place] with his wife.  Uses a cane when ambulating.  Can walk about 25 yds prior to having to rest - says  he stumbles if he has to work further than that.  Usually uses a scooter to get around stores.   Social Determinants of Health   Financial Resource Strain: Low Risk  (04/30/2022)   Overall Financial Resource Strain (CARDIA)    Difficulty of Paying Living Expenses: Not very hard  Food Insecurity: No Food Insecurity (07/27/2022)   Hunger Vital Sign    Worried About Running Out of Food in the Last Year: Never true    Ran Out of Food in the Last Year: Never true  Transportation Needs: No Transportation Needs (07/27/2022)   PRAPARE - Administrator, Civil Service (Medical): No    Lack of Transportation (Non-Medical): No  Physical Activity: Unknown (11/25/2018)   Exercise Vital Sign    Days of Exercise per Week: 0 days    Minutes of Exercise per Session: Not asked  Stress: No Stress Concern Present (11/25/2018)   [employer] of Occupational Health - Occupational Stress Questionnaire    Feeling of Stress : Only a little  Social Connections: Moderately Integrated (01/13/2018)   Social Connection and Isolation Panel [NHANES]    Frequency of Communication with Friends and Family: More than three times a week    Frequency of Social Gatherings with Friends and Family: Once a week    Attends Religious Services: More than 4 times per year    Active Member of [employer] or Organizations: No    Attends Engineer, structural: Never    Marital Status: Married     Family History: The patient's family history includes Diabetes in his father; Heart attack in his father; Hypertension in his father and mother; Stroke in his mother.  ROS:   Please see the history of present illness.    All other systems reviewed and are negative.  EKGs/Labs/Other Studies Reviewed:    The following studies were reviewed today:  March 03, 2022 ZIO monitor reviewed  June 22, 2022 EKG shows sinus rhythm with a first-degree AV delay, right bundle-branch block and left anterior fascicular block.  The  heart rate is 49 bpm.  Recent Labs: 01/10/2022: Magnesium 2.1 06/22/2022: ALT 39; B Natriuretic Peptide 168.2; TSH 1.095 06/23/2022: Hemoglobin 8.2; Platelets 153 06/24/2022: BUN 17; Creatinine, Ser 1.53; Potassium 3.6; Sodium 143  Recent Lipid Panel    Component Value Date/Time   CHOL 128 04/22/2022 0519   CHOL 134 11/12/2021 1532   TRIG 91 04/22/2022 0519   HDL 47 04/22/2022 0519   HDL 53 11/12/2021 1532   CHOLHDL 2.7 04/22/2022 0519   VLDL 18 04/22/2022 0519   LDLCALC 63 04/22/2022 0519   LDLCALC 52 11/12/2021 1532   LDLCALC 65 02/13/2020 1417    Physical Exam:    VS:  There were no vitals taken for this visit.    Wt Readings from  Last 3 Encounters:  06/24/22 164 lb 12.8 oz (74.8 kg)  05/22/22 158 lb 4 oz (71.8 kg)  04/21/22 143 lb 8.3 oz (65.1 kg)     GEN: *** Well nourished, well developed in no acute distress HEENT: Normal NECK: No JVD; No carotid bruits LYMPHATICS: No lymphadenopathy CARDIAC: ***RRR, no murmurs, rubs, gallops RESPIRATORY:  Clear to auscultation without rales, wheezing or rhonchi  ABDOMEN: Soft, non-tender, non-distended MUSCULOSKELETAL:  No edema; No deformity  SKIN: Warm and dry NEUROLOGIC:  Alert and oriented x 3 PSYCHIATRIC:  Normal affect        ASSESSMENT:    1. Bradycardia   2. Paroxysmal atrial fibrillation (HCC)    PLAN:    In order of problems listed above:  #Bradycardia Good chronotropic competence on July 2023 ZIO monitor.  I do not think there is a current indication for permanent pacing.  #Paroxysmal atrial fibrillation On Eliquis for stroke prophylaxis.  Low burden of arrhythmia.    Follow-up 1 year with APP.    Medication Adjustments/Labs and Tests Ordered: Current medicines are reviewed at length with the patient today.  Concerns regarding medicines are outlined above.  No orders of the defined types were placed in this encounter.  No orders of the defined types were placed in this  encounter.    Signed, Steffanie Dunn, MD, [facility], [facility] 08/05/2022 9:41 AM    Electrophysiology [facility]

## 2022-08-05 NOTE — Telephone Encounter (Signed)
Requested Prescriptions  Pending Prescriptions Disp Refills   mirtazapine (REMERON) 7.5 MG tablet [Pharmacy Med Name: MIRTAZAPINE 7.5 MG TAB] 90 tablet 0    Sig: TAKE ONE TABLET BY MOUTH AT BEDTIME     Psychiatry: Antidepressants - mirtazapine Passed - 08/04/2022  4:33 PM      Passed - Completed PHQ-2 or PHQ-9 in the last 360 days      Passed - Valid encounter within last 6 months    Recent Outpatient Visits           2 weeks ago Acute cystitis with hematuria   [facility] Larae Grooms, NP   1 month ago Hospital discharge follow-up   [facility] Larae Grooms, NP   1 month ago Recurrent strokes [facility])   [facility] Larae Grooms, NP   3 months ago Recurrent strokes (HCC)   [facility] Mecum, Erin E, PA-C   4 months ago Hemiplegia and hemiparesis following cerebral infarction affecting left non-dominant side (HCC)   [facility] Mecum, Oswaldo Conroy, PA-C       Future Appointments             Today Lanier Prude, MD [facility] A Dept Of [place]. [facility]   In 3 weeks Richardo Hanks, Laurette Schimke, MD [facility] [place]   In 4 weeks End, Cristal Deer, MD [facility] A Dept Of [place]. [facility]

## 2022-08-07 ENCOUNTER — Encounter: Payer: Medicare Other | Admitting: *Deleted

## 2022-08-07 DIAGNOSIS — E119 Type 2 diabetes mellitus without complications: Secondary | ICD-10-CM | POA: Diagnosis not present

## 2022-08-07 DIAGNOSIS — T17908D Unspecified foreign body in respiratory tract, part unspecified causing other injury, subsequent encounter: Secondary | ICD-10-CM | POA: Diagnosis not present

## 2022-08-07 DIAGNOSIS — Z7901 Long term (current) use of anticoagulants: Secondary | ICD-10-CM | POA: Diagnosis not present

## 2022-08-07 DIAGNOSIS — Z9181 History of falling: Secondary | ICD-10-CM | POA: Diagnosis not present

## 2022-08-07 DIAGNOSIS — Z7984 Long term (current) use of oral hypoglycemic drugs: Secondary | ICD-10-CM | POA: Diagnosis not present

## 2022-08-07 DIAGNOSIS — N3001 Acute cystitis with hematuria: Secondary | ICD-10-CM | POA: Diagnosis not present

## 2022-08-11 ENCOUNTER — Encounter: Payer: Self-pay | Admitting: Urology

## 2022-08-11 ENCOUNTER — Ambulatory Visit: Payer: Medicare Other

## 2022-08-11 ENCOUNTER — Encounter: Payer: Medicare Other | Admitting: Speech Pathology

## 2022-08-11 ENCOUNTER — Encounter: Payer: Medicare Other | Admitting: Occupational Therapy

## 2022-08-11 ENCOUNTER — Ambulatory Visit: Payer: Self-pay | Admitting: *Deleted

## 2022-08-11 NOTE — Patient Instructions (Signed)
Visit Information  Thank you for taking time to visit with me today. Please don't hesitate to contact me if I can be of assistance to you.   Following are the goals we discussed today:   Goals Addressed             This Visit's Progress    Per spouse "I would like to be paid to care for my husband"       Care Coordination Interventions: Collaboration phone call to [employer] to follow up with CAPS referral, application received and is ready for nursing assessment to be scheduled Confirmed that [employer] will be closed until 08/13/22-will return call at that time to assist with scheduling in home assessment Contact information also provided to patient's spouse to contact [employer] as well to schedule [phone], patient's spouse confirms that she has called [employer] and has not received a return call Patient's spouse continues to discuss need for CAPS services and desire to provide care for patient          Our next appointment is by telephone on 08/13/22 at 10am  Please call the care guide team at [phone] if you need to cancel or reschedule your appointment.   If you are experiencing a Mental Health or Behavioral Health Crisis or need someone to talk to, please call 911   Patient verbalizes understanding of instructions and care plan provided today and agrees to view in MyChart. Active MyChart status and patient understanding of how to access instructions and care plan via MyChart confirmed with patient.     Telephone follow up appointment with care management team member scheduled for: 08/13/22  Verna Czech, LCSW Clinical Social Worker  [facility] Care Management [phone]

## 2022-08-11 NOTE — Telephone Encounter (Signed)
Tiffany calling from [facility] is calling to report that the complete set of orders were not received via fax. Only received the first pages. And there should be 7 pages. Can this document please be refax? Fax- [phone] CB- [phone]

## 2022-08-11 NOTE — Patient Outreach (Signed)
  Care Coordination   Follow Up Visit Note   08/11/2022 Name: Evan ACHOR Sr. MRN: [id number] DOB: [birth year]  Evan Ano Sr. is a 66 y.o. year old male who sees Larae Grooms, NP for primary care. I spoke with  Evan Ano Sr. by phone today.  What matters to the patients health and wellness today?  In home services    Goals Addressed             This Visit's Progress    Per spouse "I would like to be paid to care for my husband"       Care Coordination Interventions: Collaboration phone call to [facility] to follow up with CAPS referral, application received and is ready for nursing assessment to be scheduled Confirmed that [facility] will be closed until 08/13/22-will return call at that time to assist with scheduling in home assessment Contact information also provided to patient's spouse to contact [facility] as well to schedule [phone], patient's spouse confirms that she has called [facility] and has not received a return call Patient's spouse continues to discuss need for CAPS services and desire to provide care for patient          SDOH assessments and interventions completed:  No     Care Coordination Interventions:  Yes, provided   Follow up plan: Follow up call scheduled for 08/13/22    Encounter Outcome:  Pt. Visit Completed

## 2022-08-11 NOTE — Telephone Encounter (Signed)
Forms re-faxed to [name].

## 2022-08-12 ENCOUNTER — Encounter: Payer: Medicare Other | Admitting: Nurse Practitioner

## 2022-08-12 ENCOUNTER — Encounter: Payer: Medicare Other | Admitting: Occupational Therapy

## 2022-08-12 ENCOUNTER — Ambulatory Visit: Payer: Medicare Other

## 2022-08-13 ENCOUNTER — Ambulatory Visit: Payer: Self-pay | Admitting: *Deleted

## 2022-08-13 NOTE — Patient Instructions (Signed)
Visit Information  Thank you for taking time to visit with me today. Please don't hesitate to contact me if I can be of assistance to you.   Following are the goals we discussed today:   Goals Addressed             This Visit's Progress    Per spouse "I would like to be paid to care for my husband"       Care Coordination Interventions: Collaboration phone call to [facility] to follow up with CAPS referral, application received and is ready for nursing assessment to be scheduled Left message with the scheduler for the CAPS assessment after a 23 minute wait time Contact information also provided to patient's spouse to contact [facility] as well to schedule [phone], patient's spouse confirms that she has called [facility] as well  and has not received a return call Patient's spouse continues to discuss need for CAPS services and desire to provide care for patient          Our next appointment is by telephone on 08/24/22 at 10am  Please call the care guide team at [phone] if you need to cancel or reschedule your appointment.   If you are experiencing a Mental Health or Behavioral Health Crisis or need someone to talk to, please call 911   Patient verbalizes understanding of instructions and care plan provided today and agrees to view in MyChart. Active MyChart status and patient understanding of how to access instructions and care plan via MyChart confirmed with patient.     Telephone follow up appointment with care management team member scheduled for: 08/24/22  Verna Czech, LCSW Clinical Social Worker  [facility] Management [phone]

## 2022-08-13 NOTE — Patient Outreach (Signed)
  Care Coordination   Follow Up Visit Note   08/13/2022 Name: Evan BROXTERMAN Sr. MRN: [id number] DOB: 1956-07-04  Evan Ano Sr. is a 66 y.o. year old male who sees Larae Grooms, NP for primary care. I spoke with  Evan Ano Sr.'s spouse by phone today.  What matters to the patients health and wellness today?  CAP services    Goals Addressed             This Visit's Progress    Per spouse "I would like to be paid to care for my husband"       Care Coordination Interventions: Collaboration phone call to [facility] to follow up with CAPS referral, application received and is ready for nursing assessment to be scheduled Left message with the scheduler for the CAPS assessment after a 23 minute wait time Contact information also provided to patient's spouse to contact [facility] as well to schedule [phone], patient's spouse confirms that she has called [facility] as well  and has not received a return call Patient's spouse continues to discuss need for CAPS services and desire to provide care for patient          SDOH assessments and interventions completed:  No     Care Coordination Interventions:  Yes, provided   Follow up plan: Follow up call scheduled for 08/24/22    Encounter Outcome:  Pt. Visit Completed

## 2022-08-14 DIAGNOSIS — Z7901 Long term (current) use of anticoagulants: Secondary | ICD-10-CM | POA: Diagnosis not present

## 2022-08-14 DIAGNOSIS — T17908D Unspecified foreign body in respiratory tract, part unspecified causing other injury, subsequent encounter: Secondary | ICD-10-CM | POA: Diagnosis not present

## 2022-08-14 DIAGNOSIS — N3001 Acute cystitis with hematuria: Secondary | ICD-10-CM | POA: Diagnosis not present

## 2022-08-14 DIAGNOSIS — Z9181 History of falling: Secondary | ICD-10-CM | POA: Diagnosis not present

## 2022-08-14 DIAGNOSIS — Z7984 Long term (current) use of oral hypoglycemic drugs: Secondary | ICD-10-CM | POA: Diagnosis not present

## 2022-08-14 DIAGNOSIS — R531 Weakness: Secondary | ICD-10-CM | POA: Diagnosis not present

## 2022-08-14 DIAGNOSIS — E119 Type 2 diabetes mellitus without complications: Secondary | ICD-10-CM | POA: Diagnosis not present

## 2022-08-18 ENCOUNTER — Ambulatory Visit: Payer: Medicare Other

## 2022-08-18 ENCOUNTER — Encounter: Payer: Medicare Other | Admitting: Occupational Therapy

## 2022-08-18 DIAGNOSIS — E119 Type 2 diabetes mellitus without complications: Secondary | ICD-10-CM | POA: Diagnosis not present

## 2022-08-18 DIAGNOSIS — Z9181 History of falling: Secondary | ICD-10-CM | POA: Diagnosis not present

## 2022-08-18 DIAGNOSIS — Z7984 Long term (current) use of oral hypoglycemic drugs: Secondary | ICD-10-CM | POA: Diagnosis not present

## 2022-08-18 DIAGNOSIS — T17908D Unspecified foreign body in respiratory tract, part unspecified causing other injury, subsequent encounter: Secondary | ICD-10-CM | POA: Diagnosis not present

## 2022-08-18 DIAGNOSIS — N3001 Acute cystitis with hematuria: Secondary | ICD-10-CM | POA: Diagnosis not present

## 2022-08-18 DIAGNOSIS — Z7901 Long term (current) use of anticoagulants: Secondary | ICD-10-CM | POA: Diagnosis not present

## 2022-08-19 ENCOUNTER — Ambulatory Visit: Payer: Medicare Other

## 2022-08-19 ENCOUNTER — Encounter: Payer: Medicare Other | Admitting: Occupational Therapy

## 2022-08-19 ENCOUNTER — Encounter: Payer: Medicare Other | Admitting: Speech Pathology

## 2022-08-21 ENCOUNTER — Emergency Department: Payer: 59

## 2022-08-21 ENCOUNTER — Other Ambulatory Visit: Payer: Self-pay

## 2022-08-21 ENCOUNTER — Inpatient Hospital Stay
Admission: EM | Admit: 2022-08-21 | Discharge: 2022-08-31 | DRG: 177 | Disposition: A | Discharge status: Home Health | Payer: 59 | Attending: Student | Admitting: Student

## 2022-08-21 DIAGNOSIS — R001 Bradycardia, unspecified: Secondary | ICD-10-CM | POA: Diagnosis present

## 2022-08-21 DIAGNOSIS — D638 Anemia in other chronic diseases classified elsewhere: Secondary | ICD-10-CM | POA: Diagnosis not present

## 2022-08-21 DIAGNOSIS — E1122 Type 2 diabetes mellitus with diabetic chronic kidney disease: Secondary | ICD-10-CM | POA: Diagnosis not present

## 2022-08-21 DIAGNOSIS — E43 Unspecified severe protein-calorie malnutrition: Secondary | ICD-10-CM | POA: Diagnosis present

## 2022-08-21 DIAGNOSIS — Z794 Long term (current) use of insulin: Secondary | ICD-10-CM | POA: Diagnosis not present

## 2022-08-21 DIAGNOSIS — Z1152 Encounter for screening for COVID-19: Secondary | ICD-10-CM

## 2022-08-21 DIAGNOSIS — R69 Illness, unspecified: Secondary | ICD-10-CM | POA: Diagnosis not present

## 2022-08-21 DIAGNOSIS — A419 Sepsis, unspecified organism: Secondary | ICD-10-CM | POA: Diagnosis not present

## 2022-08-21 DIAGNOSIS — R1312 Dysphagia, oropharyngeal phase: Secondary | ICD-10-CM | POA: Diagnosis not present

## 2022-08-21 DIAGNOSIS — Z79899 Other long term (current) drug therapy: Secondary | ICD-10-CM

## 2022-08-21 DIAGNOSIS — Z87891 Personal history of nicotine dependence: Secondary | ICD-10-CM | POA: Diagnosis not present

## 2022-08-21 DIAGNOSIS — R531 Weakness: Secondary | ICD-10-CM | POA: Diagnosis not present

## 2022-08-21 DIAGNOSIS — I480 Paroxysmal atrial fibrillation: Secondary | ICD-10-CM | POA: Diagnosis not present

## 2022-08-21 DIAGNOSIS — N189 Chronic kidney disease, unspecified: Secondary | ICD-10-CM | POA: Diagnosis present

## 2022-08-21 DIAGNOSIS — R6521 Severe sepsis with septic shock: Secondary | ICD-10-CM

## 2022-08-21 DIAGNOSIS — E861 Hypovolemia: Secondary | ICD-10-CM | POA: Diagnosis present

## 2022-08-21 DIAGNOSIS — R918 Other nonspecific abnormal finding of lung field: Secondary | ICD-10-CM | POA: Diagnosis not present

## 2022-08-21 DIAGNOSIS — I5032 Chronic diastolic (congestive) heart failure: Secondary | ICD-10-CM | POA: Diagnosis present

## 2022-08-21 DIAGNOSIS — E7800 Pure hypercholesterolemia, unspecified: Secondary | ICD-10-CM | POA: Diagnosis present

## 2022-08-21 DIAGNOSIS — Z8249 Family history of ischemic heart disease and other diseases of the circulatory system: Secondary | ICD-10-CM

## 2022-08-21 DIAGNOSIS — Z6823 Body mass index (BMI) 23.0-23.9, adult: Secondary | ICD-10-CM

## 2022-08-21 DIAGNOSIS — E1165 Type 2 diabetes mellitus with hyperglycemia: Secondary | ICD-10-CM | POA: Diagnosis not present

## 2022-08-21 DIAGNOSIS — I69354 Hemiplegia and hemiparesis following cerebral infarction affecting left non-dominant side: Secondary | ICD-10-CM | POA: Diagnosis not present

## 2022-08-21 DIAGNOSIS — I130 Hypertensive heart and chronic kidney disease with heart failure and stage 1 through stage 4 chronic kidney disease, or unspecified chronic kidney disease: Secondary | ICD-10-CM | POA: Diagnosis not present

## 2022-08-21 DIAGNOSIS — I959 Hypotension, unspecified: Secondary | ICD-10-CM | POA: Diagnosis not present

## 2022-08-21 DIAGNOSIS — R1319 Other dysphagia: Secondary | ICD-10-CM | POA: Diagnosis not present

## 2022-08-21 DIAGNOSIS — E860 Dehydration: Secondary | ICD-10-CM | POA: Diagnosis not present

## 2022-08-21 DIAGNOSIS — R1310 Dysphagia, unspecified: Secondary | ICD-10-CM | POA: Diagnosis not present

## 2022-08-21 DIAGNOSIS — I9589 Other hypotension: Secondary | ICD-10-CM

## 2022-08-21 DIAGNOSIS — R471 Dysarthria and anarthria: Secondary | ICD-10-CM | POA: Diagnosis not present

## 2022-08-21 DIAGNOSIS — I639 Cerebral infarction, unspecified: Secondary | ICD-10-CM | POA: Diagnosis not present

## 2022-08-21 DIAGNOSIS — M79605 Pain in left leg: Secondary | ICD-10-CM | POA: Diagnosis not present

## 2022-08-21 DIAGNOSIS — Z931 Gastrostomy status: Secondary | ICD-10-CM

## 2022-08-21 DIAGNOSIS — J690 Pneumonitis due to inhalation of food and vomit: Principal | ICD-10-CM

## 2022-08-21 DIAGNOSIS — E11649 Type 2 diabetes mellitus with hypoglycemia without coma: Secondary | ICD-10-CM | POA: Diagnosis not present

## 2022-08-21 DIAGNOSIS — R579 Shock, unspecified: Secondary | ICD-10-CM | POA: Diagnosis not present

## 2022-08-21 DIAGNOSIS — N1831 Chronic kidney disease, stage 3a: Secondary | ICD-10-CM | POA: Diagnosis present

## 2022-08-21 DIAGNOSIS — N401 Enlarged prostate with lower urinary tract symptoms: Secondary | ICD-10-CM

## 2022-08-21 DIAGNOSIS — N179 Acute kidney failure, unspecified: Secondary | ICD-10-CM | POA: Diagnosis not present

## 2022-08-21 DIAGNOSIS — K219 Gastro-esophageal reflux disease without esophagitis: Secondary | ICD-10-CM

## 2022-08-21 DIAGNOSIS — N138 Other obstructive and reflux uropathy: Secondary | ICD-10-CM

## 2022-08-21 DIAGNOSIS — Z66 Do not resuscitate: Secondary | ICD-10-CM | POA: Diagnosis not present

## 2022-08-21 DIAGNOSIS — E871 Hypo-osmolality and hyponatremia: Secondary | ICD-10-CM | POA: Diagnosis not present

## 2022-08-21 DIAGNOSIS — I4510 Unspecified right bundle-branch block: Secondary | ICD-10-CM | POA: Diagnosis not present

## 2022-08-21 DIAGNOSIS — Z743 Need for continuous supervision: Secondary | ICD-10-CM | POA: Diagnosis not present

## 2022-08-21 DIAGNOSIS — R197 Diarrhea, unspecified: Secondary | ICD-10-CM | POA: Diagnosis not present

## 2022-08-21 DIAGNOSIS — Z833 Family history of diabetes mellitus: Secondary | ICD-10-CM

## 2022-08-21 DIAGNOSIS — N400 Enlarged prostate without lower urinary tract symptoms: Secondary | ICD-10-CM | POA: Diagnosis present

## 2022-08-21 DIAGNOSIS — R791 Abnormal coagulation profile: Secondary | ICD-10-CM | POA: Diagnosis present

## 2022-08-21 DIAGNOSIS — I509 Heart failure, unspecified: Secondary | ICD-10-CM | POA: Diagnosis not present

## 2022-08-21 DIAGNOSIS — Z515 Encounter for palliative care: Secondary | ICD-10-CM

## 2022-08-21 DIAGNOSIS — I517 Cardiomegaly: Secondary | ICD-10-CM | POA: Diagnosis not present

## 2022-08-21 DIAGNOSIS — Z7189 Other specified counseling: Secondary | ICD-10-CM | POA: Diagnosis not present

## 2022-08-21 DIAGNOSIS — Z7901 Long term (current) use of anticoagulants: Secondary | ICD-10-CM

## 2022-08-21 DIAGNOSIS — Z823 Family history of stroke: Secondary | ICD-10-CM

## 2022-08-21 DIAGNOSIS — I10 Essential (primary) hypertension: Secondary | ICD-10-CM | POA: Diagnosis not present

## 2022-08-21 LAB — CBC WITH DIFFERENTIAL/PLATELET
Abs Immature Granulocytes: 0.01 10*3/uL (ref 0.00–0.07)
Basophils Absolute: 0 10*3/uL (ref 0.0–0.1)
Basophils Relative: 0 %
Eosinophils Absolute: 0 10*3/uL (ref 0.0–0.5)
Eosinophils Relative: 1 %
HCT: 30.9 % — ABNORMAL LOW (ref 39.0–52.0)
Hemoglobin: 9.2 g/dL — ABNORMAL LOW (ref 13.0–17.0)
Immature Granulocytes: 0 %
Lymphocytes Relative: 16 %
Lymphs Abs: 0.5 10*3/uL — ABNORMAL LOW (ref 0.7–4.0)
MCH: 26.3 pg (ref 26.0–34.0)
MCHC: 29.8 g/dL — ABNORMAL LOW (ref 30.0–36.0)
MCV: 88.3 fL (ref 80.0–100.0)
Monocytes Absolute: 0.2 10*3/uL (ref 0.1–1.0)
Monocytes Relative: 7 %
Neutro Abs: 2.4 10*3/uL (ref 1.7–7.7)
Neutrophils Relative %: 76 %
Platelets: 143 10*3/uL — ABNORMAL LOW (ref 150–400)
RBC: 3.5 MIL/uL — ABNORMAL LOW (ref 4.22–5.81)
RDW: 19.8 % — ABNORMAL HIGH (ref 11.5–15.5)
WBC: 3.2 10*3/uL — ABNORMAL LOW (ref 4.0–10.5)
nRBC: 0.9 % — ABNORMAL HIGH (ref 0.0–0.2)

## 2022-08-21 LAB — COMPREHENSIVE METABOLIC PANEL
ALT: 68 U/L — ABNORMAL HIGH (ref 0–44)
AST: 41 U/L (ref 15–41)
Albumin: 3.2 g/dL — ABNORMAL LOW (ref 3.5–5.0)
Alkaline Phosphatase: 172 U/L — ABNORMAL HIGH (ref 38–126)
Anion gap: 9 (ref 5–15)
BUN: 36 mg/dL — ABNORMAL HIGH (ref 8–23)
CO2: 20 mmol/L — ABNORMAL LOW (ref 22–32)
Calcium: 8.5 mg/dL — ABNORMAL LOW (ref 8.9–10.3)
Chloride: 114 mmol/L — ABNORMAL HIGH (ref 98–111)
Creatinine, Ser: 2.53 mg/dL — ABNORMAL HIGH (ref 0.61–1.24)
GFR, Estimated: 27 mL/min — ABNORMAL LOW (ref >60)
Glucose, Bld: 80 mg/dL (ref 70–99)
Potassium: 4.3 mmol/L (ref 3.5–5.1)
Sodium: 143 mmol/L (ref 135–145)
Total Bilirubin: 0.5 mg/dL (ref 0.3–1.2)
Total Protein: 8.1 g/dL (ref 6.5–8.1)

## 2022-08-21 LAB — RESP PANEL BY RT-PCR (RSV, FLU A&B, COVID)  RVPGX2
Influenza A by PCR: NEGATIVE
Influenza B by PCR: NEGATIVE
Resp Syncytial Virus by PCR: NEGATIVE
SARS Coronavirus 2 by RT PCR: NEGATIVE

## 2022-08-21 LAB — CBG MONITORING, ED: Glucose-Capillary: 85 mg/dL (ref 70–99)

## 2022-08-21 LAB — PROTIME-INR
INR: 1.6 — ABNORMAL HIGH (ref 0.8–1.2)
Prothrombin Time: 19 seconds — ABNORMAL HIGH (ref 11.4–15.2)

## 2022-08-21 LAB — LACTIC ACID, PLASMA
Lactic Acid, Venous: 1.2 mmol/L (ref 0.5–1.9)
Lactic Acid, Venous: 0.5 mmol/L (ref 0.5–1.9)

## 2022-08-21 LAB — PROCALCITONIN: Procalcitonin: 0.2 ng/mL

## 2022-08-21 MED ORDER — MIRTAZAPINE 15 MG PO TABS
7.5000 mg | ORAL_TABLET | Freq: Every day | ORAL | Status: DC
  Administered 2022-08-22 – 2022-08-27 (×4): 7.5 mg via ORAL
  Filled 2022-08-21 (×5): qty 1

## 2022-08-21 MED ORDER — TAMSULOSIN HCL 0.4 MG PO CAPS
0.4000 mg | ORAL_CAPSULE | Freq: Every day | ORAL | Status: DC
  Administered 2022-08-22 – 2022-08-27 (×4): 0.4 mg via ORAL
  Filled 2022-08-21 (×5): qty 1

## 2022-08-21 MED ORDER — ATORVASTATIN CALCIUM 20 MG PO TABS
80.0000 mg | ORAL_TABLET | Freq: Every evening | ORAL | Status: DC
  Administered 2022-08-21 – 2022-08-23 (×3): 80 mg via ORAL
  Filled 2022-08-21 (×3): qty 4

## 2022-08-21 MED ORDER — SODIUM CHLORIDE 0.9 % IV BOLUS (SEPSIS)
1000.0000 mL | Freq: Once | INTRAVENOUS | Status: AC
  Administered 2022-08-21: 1000 mL via INTRAVENOUS

## 2022-08-21 MED ORDER — PANTOPRAZOLE SODIUM 40 MG PO TBEC: 40.0000 mg | DELAYED_RELEASE_TABLET | Freq: Every day | ORAL | Status: DC

## 2022-08-21 MED ORDER — SODIUM CHLORIDE 0.9% FLUSH
3.0000 mL | Freq: Two times a day (BID) | INTRAVENOUS | Status: DC
  Administered 2022-08-21 – 2022-08-31 (×20): 3 mL via INTRAVENOUS

## 2022-08-21 MED ORDER — SODIUM CHLORIDE 0.9 % IV BOLUS (SEPSIS)
250.0000 mL | Freq: Once | INTRAVENOUS | Status: AC
  Administered 2022-08-21: 250 mL via INTRAVENOUS

## 2022-08-21 MED ORDER — ENOXAPARIN SODIUM 40 MG/0.4ML IJ SOSY: 40.0000 mg | PREFILLED_SYRINGE | INTRAMUSCULAR | Status: DC

## 2022-08-21 MED ORDER — INSULIN ASPART 100 UNIT/ML IJ SOLN
0.0000 [IU] | Freq: Three times a day (TID) | INTRAMUSCULAR | Status: DC
  Filled 2022-08-21: qty 1

## 2022-08-21 MED ORDER — SODIUM CHLORIDE 0.9 % IV BOLUS
1000.0000 mL | Freq: Once | INTRAVENOUS | Status: AC
  Administered 2022-08-21: 1000 mL via INTRAVENOUS

## 2022-08-21 MED ORDER — ONDANSETRON HCL 4 MG/2ML IJ SOLN: 4.0000 mg | Freq: Four times a day (QID) | INTRAMUSCULAR | Status: DC | PRN

## 2022-08-21 MED ORDER — ONDANSETRON HCL 4 MG PO TABS: 4.0000 mg | ORAL_TABLET | Freq: Four times a day (QID) | ORAL | Status: DC | PRN

## 2022-08-21 MED ORDER — VANCOMYCIN HCL IN DEXTROSE 1-5 GM/200ML-% IV SOLN: 1000.0000 mg | Freq: Once | INTRAVENOUS | Status: DC

## 2022-08-21 MED ORDER — SODIUM CHLORIDE 0.9 % IV SOLN: 2.0000 g | INTRAVENOUS | Status: DC

## 2022-08-21 MED ORDER — APIXABAN 5 MG PO TABS
5.0000 mg | ORAL_TABLET | Freq: Two times a day (BID) | ORAL | Status: DC
  Administered 2022-08-21 – 2022-08-24 (×6): 5 mg via ORAL
  Filled 2022-08-21 (×6): qty 1

## 2022-08-21 MED ORDER — LACTATED RINGERS IV SOLN: INTRAVENOUS | Status: AC

## 2022-08-21 MED ORDER — SODIUM CHLORIDE 0.9 % IV SOLN
2.0000 g | Freq: Once | INTRAVENOUS | Status: AC
  Administered 2022-08-21: 2 g via INTRAVENOUS
  Filled 2022-08-21: qty 12.5

## 2022-08-21 MED ORDER — VANCOMYCIN HCL 1250 MG/250ML IV SOLN
1250.0000 mg | Freq: Once | INTRAVENOUS | Status: AC
  Administered 2022-08-21: 1250 mg via INTRAVENOUS
  Filled 2022-08-21: qty 250

## 2022-08-21 MED ORDER — SODIUM CHLORIDE 0.9 % IV SOLN
3.0000 g | Freq: Two times a day (BID) | INTRAVENOUS | Status: DC
  Administered 2022-08-22 – 2022-08-24 (×5): 3 g via INTRAVENOUS
  Filled 2022-08-21: qty 3
  Filled 2022-08-21 (×2): qty 8
  Filled 2022-08-21 (×2): qty 3

## 2022-08-21 MED ORDER — METHYLPREDNISOLONE SODIUM SUCC 125 MG IJ SOLR: 125.0000 mg | INTRAMUSCULAR | Status: DC

## 2022-08-21 MED ORDER — SODIUM CHLORIDE 0.9 % IV SOLN
500.0000 mg | Freq: Once | INTRAVENOUS | Status: AC
  Administered 2022-08-21: 500 mg via INTRAVENOUS
  Filled 2022-08-21: qty 5

## 2022-08-21 MED ORDER — ALBUTEROL SULFATE (2.5 MG/3ML) 0.083% IN NEBU: 2.5000 mg | INHALATION_SOLUTION | RESPIRATORY_TRACT | Status: DC | PRN

## 2022-08-21 MED ORDER — ACETAMINOPHEN 650 MG RE SUPP: 650.0000 mg | Freq: Four times a day (QID) | RECTAL | Status: DC | PRN

## 2022-08-21 MED ORDER — ACETAMINOPHEN 325 MG PO TABS
650.0000 mg | ORAL_TABLET | Freq: Four times a day (QID) | ORAL | Status: DC | PRN
  Administered 2022-08-23 – 2022-08-24 (×3): 650 mg via ORAL
  Filled 2022-08-21 (×3): qty 2

## 2022-08-21 MED ORDER — SODIUM CHLORIDE 0.9 % IV SOLN
3.0000 g | Freq: Four times a day (QID) | INTRAVENOUS | Status: DC
  Administered 2022-08-21: 3 g via INTRAVENOUS
  Filled 2022-08-21: qty 8

## 2022-08-21 NOTE — Progress Notes (Signed)
PHARMACY NOTE:  ANTIMICROBIAL RENAL DOSAGE ADJUSTMENT  Current antimicrobial regimen includes a mismatch between antimicrobial dosage and estimated renal function.  As per policy approved by the Pharmacy & Therapeutics and Medical Executive Committees, the antimicrobial dosage will be adjusted accordingly.  Current antimicrobial dosage:  Unasyn 3g IV q6h  Indication: aspiration pneumonia  Renal Function:  Estimated Creatinine Clearance: 26.3 mL/min (A) (by C-G formula based on SCr of 2.53 mg/dL (H)). []      On intermittent HD, scheduled: []      On CRRT    Antimicrobial dosage has been changed to:  Unasyn 3g IV q12h  Additional comments:   Thank you for allowing pharmacy to be a part of this patient's care.  Paulina Fusi, PharmD, BCPS 08/21/2022 6:55 PM

## 2022-08-21 NOTE — ED Notes (Signed)
Pt cleaned up, placed in clean brief and chucks pad, external cath applied.

## 2022-08-21 NOTE — Assessment & Plan Note (Signed)
No focal deficits on examination today that are new, so low suspicion for new CVA.  -Continue home Eliquis

## 2022-08-21 NOTE — Assessment & Plan Note (Signed)
Per chart review, patient has a history of second-degree AV block with negative workup previously.  At this time, he is not symptomatic from his bradycardia.  -Continue outpatient follow-up with cardiology.

## 2022-08-21 NOTE — H&P (Signed)
History and Physical    Patient: Evan Townsend [id number] DOB: 01-Oct-1955 DOA: 08/21/2022 DOS: the patient was seen and examined on 08/21/2022 PCP: Jon Billings, NP  Patient coming from: Home  Chief Complaint:  Chief Complaint  Patient presents with   Leg Pain   HPI: Evan FELTZ Sr. is a 67 y.o. male with medical history significant of recurrent strokes with residual left-sided deficits, known aspiration, HFpEF, CKD stage III, type 2 diabetes, bradycardia who presents to the ED due to generalized weakness. History obtained from patient and his wife as patient has dysarthria at baseline.  [name] states that patient has been experiencing decreased appetite and generalized weakness over the last several days.  Overnight, she noticed that he was having increased difficulty with breathing in addition to a cough that sounded wet.  She was attempting to help him cough up sputum but feels that his cough is too weak.  She states that SLP has been coming to their home for therapy, but she is still worried that he is silently aspirating.  At this time, Mr. Arduini denies any shortness of breath, chest pain, palpitations.  They deny any known fevers, chills, nausea, vomiting, diarrhea, abdominal pain.  ED course: On arrival to the ED, patient was hypotensive at 85/54 with heart rate of 41.  He was afebrile at 98.1.  He was saturating at 99% on room air.  Initial workup remarkable for WBC of 3.2, hemoglobin of 9.2 and platelets of 143.  CMP with bicarb of 20, BUN of 36, creatinine 2.53, alkaline phosphatase of 172, ALT of 68 and GFR of 27.  Lactic acid within normal limits x 2.  INR elevated at 1.6. Chest x-ray was obtained that  COVID-19 PCR, influenza PCR and RSV PCR negative.  Menstruated left greater than right bibasilar infiltrates worrisome for pneumonia or aspiration.  TRH contacted for admission for aspiration pneumonia and AKI.  Review of Systems: As mentioned in the history of  present illness. All other systems reviewed and are negative. Past Medical History:  Diagnosis Date   Acute CVA (cerebrovascular accident) ([place]) 06/05/2021   Acute ischemic stroke ([place]) 04/22/2022   CAP (community acquired pneumonia) 11/26/2021   Carotid arterial disease ([place])    a. 08/2018 Carotid U/S: min-mod RICA atherosclerosis w/o hemodynamically significant stenosis. Nl LICA.   Diabetes 1.5, managed as type 2 ([place])    Diastolic dysfunction    a. 08/2018 Echo: EF 65%. No rwma. Gr1 DD. Mild MR.   Diastolic dysfunction    a. 08/2019 Echo: EF 55-60%, Gr1 DD. No rwma. Mild MR. RVSP 37.6mmHg.   Hypercholesterolemia    Hypertension    PAF (paroxysmal atrial fibrillation) ([place])    a. 10/2019 Event Monitor: PAF; b. CHA2DS2VASc = 5-->Eliquis.   Poorly controlled diabetes mellitus ([place])    a. 04/2019 A1c 13.8.   Recurrent strokes ([place])    a. 10/2016 MRI/A: Acute 58mm R thalamic infarct, ? subacute infarct of R corona radiata; b. 08/2017 MRI/A: Acute 30mm lateral L thalamic infarct. Other more remote lacunar infarcts of thalami bilat. Small vessel dzs; c. 08/2018 MRI/A: Acute lacunar infarct of the post limb of R internal capsule; d. 08/2019 MRI Acute CVA of L paramedian pons adn R cerebellar hemisphere.   Sepsis due to pneumonia ([place]) 11/26/2021   Tobacco abuse    Past Surgical History:  Procedure Laterality Date   ESOPHAGOGASTRODUODENOSCOPY (EGD) WITH PROPOFOL N/A 04/26/2019   Procedure: ESOPHAGOGASTRODUODENOSCOPY (EGD) WITH PROPOFOL;  Surgeon: Lin Landsman,  MD;  Location: [facility] ENDOSCOPY;  Service: Gastroenterology;  Laterality: N/A;   LEFT HEART CATH AND CORONARY ANGIOGRAPHY N/A 01/09/2022   Procedure: LEFT HEART CATH AND CORONARY ANGIOGRAPHY;  Surgeon: Nelva Bush, MD;  Location: [facility];  Service: Cardiovascular;  Laterality: N/A;   NO PAST SURGERIES     TEE WITHOUT CARDIOVERSION N/A 04/22/2022   Procedure: TRANSESOPHAGEAL ECHOCARDIOGRAM (TEE);  Surgeon: Kate Sable, MD;   Location: [facility] ORS;  Service: Cardiovascular;  Laterality: N/A;   Social History:  reports that he has quit smoking. His smoking use included cigarettes. He started smoking about 42 years ago. He has a 38.00 pack-year smoking history. He has never used smokeless tobacco. He reports that he does not currently use alcohol after a past usage of about 1.0 standard drink of alcohol per week. He reports that he does not currently use drugs after having used the following drugs: Cocaine and Marijuana.  No Known Allergies  Family History  Problem Relation Age of Onset   Hypertension Mother    Stroke Mother        died @ age 23   Hypertension Father    Diabetes Father    Heart attack Father        died @ 43    Prior to Admission medications   Medication Sig Start Date End Date Taking? Authorizing Provider  albuterol (VENTOLIN HFA) 108 (90 Base) MCG/ACT inhaler Inhale 2 puffs into the lungs every 4 (four) hours as needed for wheezing or shortness of breath. 07/17/22   Jon Billings, NP  amLODipine (NORVASC) 10 MG tablet TAKE 1 TABLET BY MOUTH DAILY 06/11/22   Jon Billings, NP  atorvastatin (LIPITOR) 80 MG tablet TAKE 1 TABLET BY MOUTH EVERY EVENING 06/11/22   Jon Billings, NP  dapagliflozin propanediol (FARXIGA) 10 MG TABS tablet Take 1 tablet (10 mg total) by mouth daily before breakfast. 07/02/22   Jon Billings, NP  ELIQUIS 5 MG TABS tablet TAKE 1 TABLET BY MOUTH TWICE A DAY 06/11/22   Jon Billings, NP  iron polysaccharides (NIFEREX) 150 MG capsule Take 1 capsule (150 mg total) by mouth daily. 06/10/22   Jon Billings, NP  losartan (COZAAR) 25 MG tablet TAKE 1 TABLET BY MOUTH DAILY 08/05/22   Jon Billings, NP  melatonin 1 MG TABS tablet Take 3 tablets (3 mg total) by mouth at bedtime as needed. 04/23/22   Ezekiel Slocumb, DO  metoCLOPramide (REGLAN) 5 MG tablet Take 1 tablet (5 mg total) by mouth every 8 (eight) hours as needed for nausea. 07/02/22   Jon Billings, NP  mirtazapine (REMERON) 7.5 MG tablet TAKE ONE TABLET BY MOUTH AT BEDTIME 08/05/22   Jon Billings, NP  nitroGLYCERIN (NITROSTAT) 0.4 MG SL tablet Place 1 tablet (0.4 mg total) under the tongue every 5 (five) minutes as needed for chest pain. 07/02/22   Jon Billings, NP  omeprazole (PRILOSEC) 40 MG capsule TAKE 1 CAPSULE BY MOUTH IN THE MORNING AND AT BEDTIME 06/11/22   Jon Billings, NP  tamsulosin (FLOMAX) 0.4 MG CAPS capsule Take 1 capsule (0.4 mg total) by mouth daily. 02/25/22   Billey Co, MD    Physical Exam: Vitals:   08/21/22 1315 08/21/22 1451 08/21/22 1600 08/21/22 1630  BP: (!) 85/54 (!) 86/55 133/66 (!) 109/57  Pulse: (!) 41  (!) 55 (!) 41  Resp: 18   (!) 7  Temp: 98.1 F (36.7 C)     TempSrc: Axillary     SpO2: 99%  Physical Exam Vitals and nursing note reviewed.  Constitutional:      General: He is not in acute distress.    Appearance: He is normal weight. He is not toxic-appearing.  HENT:     Head: Normocephalic and atraumatic.     Mouth/Throat:     Mouth: Mucous membranes are dry.  Eyes:     Conjunctiva/sclera: Conjunctivae normal.     Pupils: Pupils are equal, round, and reactive to light.  Cardiovascular:     Rate and Rhythm: Regular rhythm. Bradycardia present.     Heart sounds: No murmur heard.    No gallop.  Pulmonary:     Effort: Pulmonary effort is normal. No respiratory distress.     Breath sounds: Rales (Bibasilar Rales extending into the right middle lung field) present. No wheezing or rhonchi.  Abdominal:     General: Bowel sounds are normal. There is no distension.     Palpations: Abdomen is soft.     Tenderness: There is no abdominal tenderness. There is no guarding.  Musculoskeletal:     Cervical back: Neck supple.     Right lower leg: No edema.     Left lower leg: No edema.  Skin:    General: Skin is warm and dry.  Neurological:     Mental Status: He is alert and oriented to person, place, and time.      Comments:  Notable dysarthria noted. No facial asymmetry noted Left upper and lower extremity 2/5 strength Right upper and lower extremity 5/5 strength  Psychiatric:        Mood and Affect: Mood normal.        Behavior: Behavior normal.    Data Reviewed: CBC with WBC of 3.2, hemoglobin 9.2 and platelets of 143 CMP with potassium of 4.3, bicarb of 20, glucose of 80, BUN of 36, creatinine of 2.53, calcium 8.5, alkaline phosphatase 172, albumin 3.2, ALT 68 with GFR calculated to be 27. Lactic acid initially 1.2 with decreased to 0.5 INR elevated at 1.6 Influenza, COVID-19 and RSV PCR negative  EKG personally reviewed.  Sinus rhythm with rate of 54.  Significant motion artifact.  There are no new results to review at this time.  Assessment and Plan:  * Aspiration pneumonia [facility]) Patient presenting with several days of decreased appetite and weakness in the setting of known dysphagia with aspiration in the past.  Patient's wife suspects he has been silently aspirating.  Not currently requiring oxygen, however will monitor closely for hypoxia.  - Continuous pulse oximetry - Start Unasyn 3 g every 6 hours - SLP evaluation - Continue dysphagia 2 diet for now, per wife's request - Continue discussions with family regarding consideration of PEG tube if patient was to have aspiration events  Acute kidney injury superimposed on chronic kidney disease ([place]) In the setting of dehydration and poor p.o. intake.  - S/p 2.5 L of IV fluids - Continue gentle hydration overnight - Repeat BMP in the a.m. - Monitor urine output - Avoid nephrotoxic agents  Hypotension In the setting of dehydration, as patient appears quite hypovolemic on examination.  Blood pressure has been responsive to IV fluids.  Low suspicion for sepsis at this time, however patient is certainly at risk.  Due to this, will continue telemetry monitoring until evidence of clinical improvement.  - Telemetry monitoring -  Continue IV fluids as noted above - Hold home antihypertensives  Recurrent strokes ([facility]) No focal deficits on examination today that are new, so low suspicion  for new CVA.  -Continue home Eliquis  Uncontrolled type 2 diabetes mellitus with hyperglycemia, with long-term current use of insulin ([place]) - Hold home Farxiga - SSI, moderate  Sinus bradycardia Per chart review, patient has a history of second-degree AV block with negative workup previously.  At this time, he is not symptomatic from his bradycardia.  -Continue outpatient follow-up with cardiology.  Advance Care Planning:   Code Status: Full Code.  Patient initially stated that he would not want to be resuscitated, but on further discussion, patient seems to not want to be on long-term life support.  I explained to him that CODE STATUS refers to emergent situations.  In this case, patient states he does not know what he would want.  Wife at bedside is his healthcare power of attorney and legal guardian.  She states that she would like full code at this time.  Consults: None  Family Communication: Patient's wife updated at bedside  Severity of Illness: The appropriate patient status for this patient is OBSERVATION. Observation status is judged to be reasonable and necessary in order to provide the required intensity of service to ensure the patient's safety. The patient's presenting symptoms, physical exam findings, and initial radiographic and laboratory data in the context of their medical condition is felt to place them at decreased risk for further clinical deterioration. Furthermore, it is anticipated that the patient will be medically stable for discharge from the hospital within 2 midnights of admission.   Author: Jose Persia, MD 08/21/2022 5:46 PM  For on call review [email or web address].

## 2022-08-21 NOTE — Assessment & Plan Note (Addendum)
-   Hold home Cool - SSI, sensitive

## 2022-08-21 NOTE — ED Triage Notes (Addendum)
Pt to ED via [facility] from home. EMS reports went to bed around 8pm. Pt woke up this morning with left leg pain around 8-9am. Pt reports hx CVA x3. Pt has baseline left sided deficits. Pain in left leg is new. Slurred speech, left sided facial droop and left sided weakness is baseline. Pt was able to stand with assistance for EMS with a walker. Pt has been SB in the 70s. CBG 117. BP 103/53. Pt is on blood thinners. Caregiver concerned about possible CVA

## 2022-08-21 NOTE — Assessment & Plan Note (Addendum)
Patient presenting with several days of decreased appetite and weakness in the setting of known dysphagia with aspiration in the past.  Patient's wife suspects he has been silently aspirating.  Not currently requiring oxygen, however will monitor closely for hypoxia.  - Continuous pulse oximetry - Start Unasyn 3 g every 6 hours - SLP evaluation - Continue dysphagia 2 diet for now, per wife's request - Continue discussions with family regarding consideration of PEG tube if patient was to have aspiration events

## 2022-08-21 NOTE — Progress Notes (Signed)
PHARMACY -  BRIEF ANTIBIOTIC NOTE   Pharmacy has received consult(s) for cefepime, vancomycin from an ED provider.  The patient's profile has been reviewed for ht/wt/allergies/indication/available labs.    One time order(s) placed for Vancomycin 1250 mg IV x 1 and cefepime 2 gm x 1  Further antibiotics/pharmacy consults should be ordered by admitting physician if indicated.                       Thank you, Alison Murray 08/21/2022  3:44 PM

## 2022-08-21 NOTE — Assessment & Plan Note (Signed)
In the setting of dehydration and poor p.o. intake.  - S/p 2.5 L of IV fluids - Continue gentle hydration overnight - Repeat BMP in the a.m. - Monitor urine output - Avoid nephrotoxic agents

## 2022-08-21 NOTE — Assessment & Plan Note (Signed)
In the setting of dehydration, as patient appears quite hypovolemic on examination.  Blood pressure has been responsive to IV fluids.  Low suspicion for sepsis at this time, however patient is certainly at risk.  Due to this, will continue telemetry monitoring until evidence of clinical improvement.  - Telemetry monitoring - Continue IV fluids as noted above - Hold home antihypertensives

## 2022-08-21 NOTE — ED Provider Notes (Addendum)
[facility] Provider Note    Event Date/Time   First MD Initiated Contact with Patient 08/21/22 1529     (approximate)   History   Chief Complaint: Leg Pain   HPI  Evan Townsend. is a 67 y.o. male with a past history of hypertension, diabetes, atrial fibrillation, stroke who is brought to the ED due to worsening generalized weakness today.  Also complains of pain in the left side of the body.  He has had prior strokes which have left him with chronic left-sided deficits.  No falls or trauma.  Does also report congestion, shortness of breath, frequent cough over the past week.  Decreased oral intake.     Physical Exam   Triage Vital Signs: ED Triage Vitals [08/21/22 1315]  Enc Vitals Group     BP (!) 85/54     Pulse Rate (!) 41     Resp 18     Temp 98.1 F (36.7 C)     Temp Source Axillary     SpO2 99 %     Weight      Height      Head Circumference      Peak Flow      Pain Score      Pain Loc      Pain Edu?      Excl. in [place]?     Most recent vital signs: Vitals:   08/21/22 1315 08/21/22 1451  BP: (!) 85/54 (!) 86/55  Pulse: (!) 41   Resp: 18   Temp: 98.1 F (36.7 C)   SpO2: 99%     General: Awake, no distress.  CV:  Good peripheral perfusion.  Bradycardia, heart rate 50 Resp:  Normal effort.  Diminished breath sounds bilateral bases. Abd:  No distention.  Soft nontender Other:  No lower extremity edema.  Dry mucous membranes Motor function at baseline given prior strokes   ED Results / Procedures / Treatments   Labs (all labs ordered are listed, but only abnormal results are displayed) Labs Reviewed  COMPREHENSIVE METABOLIC PANEL - Abnormal; Notable for the following components:      Result Value   Chloride 114 (*)    CO2 20 (*)    BUN 36 (*)    Creatinine, Ser 2.53 (*)    Calcium 8.5 (*)    Albumin 3.2 (*)    ALT 68 (*)    Alkaline Phosphatase 172 (*)    GFR, Estimated 27 (*)    All other components within  normal limits  CBC WITH DIFFERENTIAL/PLATELET - Abnormal; Notable for the following components:   WBC 3.2 (*)    RBC 3.50 (*)    Hemoglobin 9.2 (*)    HCT 30.9 (*)    MCHC 29.8 (*)    RDW 19.8 (*)    Platelets 143 (*)    nRBC 0.9 (*)    Lymphs Abs 0.5 (*)    All other components within normal limits  PROTIME-INR - Abnormal; Notable for the following components:   Prothrombin Time 19.0 (*)    INR 1.6 (*)    All other components within normal limits  RESP PANEL BY RT-PCR (RSV, FLU A&B, COVID)  RVPGX2  CULTURE, BLOOD (ROUTINE X 2)  CULTURE, BLOOD (ROUTINE X 2)  LACTIC ACID, PLASMA  LACTIC ACID, PLASMA  URINALYSIS, ROUTINE W REFLEX MICROSCOPIC     EKG    RADIOLOGY Chest x-ray interpreted by me, shows bilateral basilar infiltrates concerning for  pneumonia.  Radiology report reviewed   PROCEDURES:  .Critical Care  Performed by: Carrie Mew, MD Authorized by: Carrie Mew, MD   Critical care provider statement:    Critical care time (minutes):  35   Critical care was necessary to treat or prevent imminent or life-threatening deterioration of the following conditions:  Sepsis and shock   Critical care was time spent personally by me on the following activities:  Development of treatment plan with patient or surrogate, discussions with consultants, evaluation of patient's response to treatment, examination of patient, ordering and review of laboratory studies, ordering and review of radiographic studies, ordering and performing treatments and interventions, pulse oximetry, re-evaluation of patient's condition and review of old charts   Care discussed with: admitting provider   Comments:        .1-3 Lead EKG Interpretation  Performed by: Carrie Mew, MD Authorized by: Carrie Mew, MD     Interpretation: normal     ECG rate:  65   ECG rate assessment: normal     Rhythm: sinus rhythm     Ectopy: none     Conduction: normal      MEDICATIONS  ORDERED IN ED: Medications  sodium chloride 0.9 % bolus 1,000 mL (1,000 mLs Intravenous New Bag/Given 08/21/22 1543)    And  sodium chloride 0.9 % bolus 250 mL (has no administration in time range)  azithromycin (ZITHROMAX) 500 mg in sodium chloride 0.9 % 250 mL IVPB (500 mg Intravenous New Bag/Given 08/21/22 1629)  vancomycin (VANCOREADY) IVPB 1250 mg/250 mL (has no administration in time range)  methylPREDNISolone sodium succinate (SOLU-MEDROL) 125 mg/2 mL injection 125 mg (has no administration in time range)  sodium chloride 0.9 % bolus 1,000 mL (0 mLs Intravenous Stopped 08/21/22 1451)  ceFEPIme (MAXIPIME) 2 g in sodium chloride 0.9 % 100 mL IVPB (0 g Intravenous Stopped 08/21/22 1625)     IMPRESSION / MDM / [place] / ED COURSE  I reviewed the triage vital signs and the nursing notes.                              Differential diagnosis includes, but is not limited to, viral illness, pneumonia, sepsis, dehydration, electrolyte abnormality, anemia  Patient's presentation is most consistent with acute presentation with potential threat to life or bodily function.  Patient presents with history and physical concerning for dehydration in the setting of acute infectious process.  He has hypotension, chest x-ray shows pneumonia, concerning for septic shock.  Lactate is normal.  Will give IV fluids, IV broad-spectrum antibiotics with coverage for HCAP and aspiration pneumonia.  I doubt intracranial hemorrhage or acute ischemic stroke.  I think his increased left-sided neurosymptoms are recrudescence of prior stroke syndrome.  He will need to be admitted, to which patient and caregiver agree.   Clinical Course as of 08/21/22 1645  Fri Aug 21, 2022  1636 Hr 60, bp 110/60.  So2 stable [PS]    Clinical Course User Index [PS] Carrie Mew, MD    ----------------------------------------- 4:45 PM on 08/21/2022 ----------------------------------------- Case discussed with  hospitalist. Sepsis reassessment completed, vasopressors not indicated.    FINAL CLINICAL IMPRESSION(S) / ED DIAGNOSES   Final diagnoses:  Aspiration pneumonia of both lower lobes, unspecified aspiration pneumonia type ([place])  Septic shock ([place])  AKI (acute kidney injury) ([place])     Rx / DC Orders   ED Discharge Orders     None  Note:  This document was prepared using Dragon voice recognition software and may include unintentional dictation errors.   Sharman Cheek, MD 08/21/22 1646    Sharman Cheek, MD 08/21/22 [phone]

## 2022-08-22 DIAGNOSIS — E871 Hypo-osmolality and hyponatremia: Secondary | ICD-10-CM | POA: Diagnosis present

## 2022-08-22 DIAGNOSIS — J690 Pneumonitis due to inhalation of food and vomit: Secondary | ICD-10-CM | POA: Diagnosis present

## 2022-08-22 DIAGNOSIS — Z515 Encounter for palliative care: Secondary | ICD-10-CM | POA: Diagnosis not present

## 2022-08-22 DIAGNOSIS — E861 Hypovolemia: Secondary | ICD-10-CM | POA: Diagnosis present

## 2022-08-22 DIAGNOSIS — E1165 Type 2 diabetes mellitus with hyperglycemia: Secondary | ICD-10-CM | POA: Diagnosis present

## 2022-08-22 DIAGNOSIS — N1831 Chronic kidney disease, stage 3a: Secondary | ICD-10-CM | POA: Diagnosis present

## 2022-08-22 DIAGNOSIS — Z931 Gastrostomy status: Secondary | ICD-10-CM | POA: Diagnosis not present

## 2022-08-22 DIAGNOSIS — Z794 Long term (current) use of insulin: Secondary | ICD-10-CM | POA: Diagnosis not present

## 2022-08-22 DIAGNOSIS — Z1152 Encounter for screening for COVID-19: Secondary | ICD-10-CM | POA: Diagnosis not present

## 2022-08-22 DIAGNOSIS — N179 Acute kidney failure, unspecified: Secondary | ICD-10-CM | POA: Diagnosis not present

## 2022-08-22 DIAGNOSIS — I480 Paroxysmal atrial fibrillation: Secondary | ICD-10-CM | POA: Diagnosis present

## 2022-08-22 DIAGNOSIS — A419 Sepsis, unspecified organism: Secondary | ICD-10-CM | POA: Diagnosis not present

## 2022-08-22 DIAGNOSIS — R001 Bradycardia, unspecified: Secondary | ICD-10-CM | POA: Diagnosis present

## 2022-08-22 DIAGNOSIS — R1312 Dysphagia, oropharyngeal phase: Secondary | ICD-10-CM | POA: Diagnosis not present

## 2022-08-22 DIAGNOSIS — E1122 Type 2 diabetes mellitus with diabetic chronic kidney disease: Secondary | ICD-10-CM | POA: Diagnosis present

## 2022-08-22 DIAGNOSIS — E11649 Type 2 diabetes mellitus with hypoglycemia without coma: Secondary | ICD-10-CM | POA: Diagnosis not present

## 2022-08-22 DIAGNOSIS — Z66 Do not resuscitate: Secondary | ICD-10-CM | POA: Diagnosis present

## 2022-08-22 DIAGNOSIS — R471 Dysarthria and anarthria: Secondary | ICD-10-CM | POA: Diagnosis present

## 2022-08-22 DIAGNOSIS — I69354 Hemiplegia and hemiparesis following cerebral infarction affecting left non-dominant side: Secondary | ICD-10-CM | POA: Diagnosis not present

## 2022-08-22 DIAGNOSIS — E7800 Pure hypercholesterolemia, unspecified: Secondary | ICD-10-CM | POA: Diagnosis present

## 2022-08-22 DIAGNOSIS — R1319 Other dysphagia: Secondary | ICD-10-CM | POA: Diagnosis not present

## 2022-08-22 DIAGNOSIS — I5032 Chronic diastolic (congestive) heart failure: Secondary | ICD-10-CM | POA: Diagnosis present

## 2022-08-22 DIAGNOSIS — E860 Dehydration: Secondary | ICD-10-CM | POA: Diagnosis present

## 2022-08-22 DIAGNOSIS — I130 Hypertensive heart and chronic kidney disease with heart failure and stage 1 through stage 4 chronic kidney disease, or unspecified chronic kidney disease: Secondary | ICD-10-CM | POA: Diagnosis present

## 2022-08-22 DIAGNOSIS — R579 Shock, unspecified: Secondary | ICD-10-CM | POA: Diagnosis present

## 2022-08-22 DIAGNOSIS — Z7189 Other specified counseling: Secondary | ICD-10-CM | POA: Diagnosis not present

## 2022-08-22 DIAGNOSIS — N400 Enlarged prostate without lower urinary tract symptoms: Secondary | ICD-10-CM | POA: Diagnosis present

## 2022-08-22 DIAGNOSIS — I959 Hypotension, unspecified: Secondary | ICD-10-CM | POA: Diagnosis not present

## 2022-08-22 DIAGNOSIS — E43 Unspecified severe protein-calorie malnutrition: Secondary | ICD-10-CM | POA: Diagnosis present

## 2022-08-22 DIAGNOSIS — D638 Anemia in other chronic diseases classified elsewhere: Secondary | ICD-10-CM | POA: Diagnosis present

## 2022-08-22 LAB — CBC
HCT: 27.3 % — ABNORMAL LOW (ref 39.0–52.0)
Hemoglobin: 8.4 g/dL — ABNORMAL LOW (ref 13.0–17.0)
MCH: 27 pg (ref 26.0–34.0)
MCHC: 30.8 g/dL (ref 30.0–36.0)
MCV: 87.8 fL (ref 80.0–100.0)
Platelets: 132 10*3/uL — ABNORMAL LOW (ref 150–400)
RBC: 3.11 MIL/uL — ABNORMAL LOW (ref 4.22–5.81)
RDW: 19.7 % — ABNORMAL HIGH (ref 11.5–15.5)
WBC: 3.5 10*3/uL — ABNORMAL LOW (ref 4.0–10.5)
nRBC: 0.8 % — ABNORMAL HIGH (ref 0.0–0.2)

## 2022-08-22 LAB — BASIC METABOLIC PANEL
Anion gap: 9 (ref 5–15)
BUN: 31 mg/dL — ABNORMAL HIGH (ref 8–23)
CO2: 18 mmol/L — ABNORMAL LOW (ref 22–32)
Calcium: 8.3 mg/dL — ABNORMAL LOW (ref 8.9–10.3)
Chloride: 118 mmol/L — ABNORMAL HIGH (ref 98–111)
Creatinine, Ser: 2.35 mg/dL — ABNORMAL HIGH (ref 0.61–1.24)
GFR, Estimated: 30 mL/min — ABNORMAL LOW (ref >60)
Glucose, Bld: 85 mg/dL (ref 70–99)
Potassium: 4 mmol/L (ref 3.5–5.1)
Sodium: 145 mmol/L (ref 135–145)

## 2022-08-22 LAB — CBG MONITORING, ED
Glucose-Capillary: 63 mg/dL — ABNORMAL LOW (ref 70–99)
Glucose-Capillary: 66 mg/dL — ABNORMAL LOW (ref 70–99)
Glucose-Capillary: 89 mg/dL (ref 70–99)

## 2022-08-22 LAB — GLUCOSE, CAPILLARY
Glucose-Capillary: 90 mg/dL (ref 70–99)
Glucose-Capillary: 85 mg/dL (ref 70–99)

## 2022-08-22 MED ORDER — LIDOCAINE 5 % EX PTCH
1.0000 | MEDICATED_PATCH | CUTANEOUS | Status: DC
  Administered 2022-08-22 – 2022-08-29 (×5): 1 via TRANSDERMAL
  Filled 2022-08-22 (×10): qty 1

## 2022-08-22 MED ORDER — LACTATED RINGERS IV SOLN: INTRAVENOUS | Status: DC

## 2022-08-22 MED ORDER — PANTOPRAZOLE SODIUM 40 MG IV SOLR
40.0000 mg | INTRAVENOUS | Status: DC
  Administered 2022-08-22 – 2022-08-31 (×10): 40 mg via INTRAVENOUS
  Filled 2022-08-22 (×10): qty 10

## 2022-08-22 NOTE — Progress Notes (Signed)
Progress Note   Patient: Evan Ferrentino VHQ:[id number] DOB: Apr 03, 1956 DOA: 08/21/2022     0 DOS: the patient was seen and examined on 08/22/2022   Brief hospital course:  AADAM ZHEN Sr. is a 67 y.o. male with medical history significant of recurrent strokes with residual left-sided deficits, known aspiration, HFpEF, CKD stage III, type 2 diabetes, bradycardia who presents to the ED due to generalized weakness. History obtained from patient and his wife as patient has dysarthria at baseline.   Mrs. Fridman states that patient has been experiencing decreased appetite and generalized weakness over the last several days.  Overnight, she noticed that he was having increased difficulty with breathing in addition to a cough that sounded wet.  She was attempting to help him cough up sputum but feels that his cough is too weak.  She states that SLP has been coming to their home for therapy, but she is still worried that he is silently aspirating.  At this time, Mr. Bosket denies any shortness of breath, chest pain, palpitations.  They deny any known fevers, chills, nausea, vomiting, diarrhea, abdominal pain.   ED course: On arrival to the ED, patient was hypotensive at 85/54 with heart rate of 41.  He was afebrile at 98.1.  He was saturating at 99% on room air.  Initial workup remarkable for WBC of 3.2, hemoglobin of 9.2 and platelets of 143.  CMP with bicarb of 20, BUN of 36, creatinine 2.53, alkaline phosphatase of 172, ALT of 68 and GFR of 27.  Lactic acid within normal limits x 2.  INR elevated at 1.6. Chest x-ray was obtained that  COVID-19 PCR, influenza PCR and RSV PCR negative.  Menstruated left greater than right bibasilar infiltrates worrisome for pneumonia or aspiration.  TRH contacted for admission for aspiration pneumonia and AKI.  Assessment and Plan:  * Aspiration pneumonia [facility]) Patient presenting with several days of decreased appetite and weakness in the setting of known dysphagia  with aspiration in the past.  Patient's wife suspects he has been silently aspirating.  Not currently requiring oxygen, however will monitor closely for hypoxia.  - Continuous pulse oximetry - c/w  Unasyn 3 g every 6 hours - SLP evaluation- On Monday orTuesday  - Continue dysphagia 2 diet for now, per wife's request - Continue discussions with family regarding consideration of PEG tube if patient was to have aspiration events -- Patient has new onset diarrhea.  Will continue to watch.  Low threshold for L[place]otil.  Acute kidney injury superimposed on chronic kidney disease ([place]) In the setting of dehydration and poor p.o. intake.  - S/p 2.5 L of IV fluids at the time of presentation in the ED. - Continue gentle hydration  - Trend lites with BMP - Monitor urine output - Avoid nephrotoxic agents  Hypotension In the setting of dehydration, as patient appears quite hypovolemic on examination.  Blood pressure has been responsive to IV fluids.  Low suspicion for sepsis at this time, however patient is certainly at risk.  Due to this, will continue telemetry monitoring until evidence of clinical improvement.  - Telemetry monitoring - Continue IV fluids as noted above - Hold home antihypertensives  Recurrent strokes (HCC) No focal deficits on examination today that are new, so low suspicion for new CVA.  -Continue home Eliquis  Uncontrolled type 2 diabetes mellitus with hyperglycemia, with long-term current use of insulin ([place]) - Hold home [place] - SSI, sensitive  Sinus bradycardia Per chart review, patient has  a history of second-degree AV block with negative workup previously.  At this time, he is not symptomatic from his bradycardia.  -Continue outpatient follow-up with cardiology.  Subjective: Patient seen and examined this morning.  No overnight events.  Vital labs and imaging reviewed.  Patient reports lack of appetite.  No complaint of shortness of breath or chest  pain.  Physical Exam: Vitals:   08/22/22 0900 08/22/22 1100 08/22/22 1130 08/22/22 1300  BP: (!) 149/80 133/74  126/71  Pulse: 73   (!) 59  Resp: 19 13  17   Temp:   98.1 F (36.7 C)   TempSrc:   Oral   SpO2: 97%   96%  Weight:      Height:        Data Reviewed:  There are no new results to review at this time.  Family Communication: Wife by bedside was made aware of patient's situation.  Disposition: Status is: Inpatient Remains inpatient appropriate because: New onset diarrhea and dysphagia with ongoing aspiration pneumonia.    Planned Discharge Destination: Home    Time spent: 35 minutes  Author: , MD 08/22/2022 2:57 PM  For on call review [email or web address].

## 2022-08-22 NOTE — ED Notes (Signed)
Pt's wife to nurses station stating pt needing to have a BM, pt placed on bed pan pt only passed flatus, pt adjusted in bed and helped with thickened liquids. Pt denies any other needs. Call bell in reach. Wife at bedside

## 2022-08-22 NOTE — Evaluation (Signed)
Clinical/Bedside Swallow Evaluation Patient Details  Name: Evan Townsend. MRN: [id number] Date of Birth: [birth year]  Today's Date: 08/22/2022 Time: SLP Start Time (ACUTE ONLY): 60 SLP Stop Time (ACUTE ONLY): 1110 SLP Time Calculation (min) (ACUTE ONLY): 55 min  Past Medical History:  Past Medical History:  Diagnosis Date   Acute CVA (cerebrovascular accident) ([place]) 06/05/2021   Acute ischemic stroke ([place]) 04/22/2022   CAP (community acquired pneumonia) 11/26/2021   Carotid arterial disease ([place])    a. 08/2018 Carotid U/S: min-mod RICA atherosclerosis w/o hemodynamically significant stenosis. Nl LICA.   Diabetes 1.5, managed as type 2 ([place])    Diastolic dysfunction    a. 08/2018 Echo: EF 65%. No rwma. Gr1 DD. Mild MR.   Diastolic dysfunction    a. 08/2019 Echo: EF 55-60%, Gr1 DD. No rwma. Mild MR. RVSP 37.73mmHg.   Hypercholesterolemia    Hypertension    PAF (paroxysmal atrial fibrillation) ([place])    a. 10/2019 Event Monitor: PAF; b. CHA2DS2VASc = 5-->Eliquis.   Poorly controlled diabetes mellitus ([place])    a. 04/2019 A1c 13.8.   Recurrent strokes ([place])    a. 10/2016 MRI/A: Acute 84mm R thalamic infarct, ? subacute infarct of R corona radiata; b. 08/2017 MRI/A: Acute 18mm lateral L thalamic infarct. Other more remote lacunar infarcts of thalami bilat. Small vessel dzs; c. 08/2018 MRI/A: Acute lacunar infarct of the post limb of R internal capsule; d. 08/2019 MRI Acute CVA of L paramedian pons adn R cerebellar hemisphere.   Sepsis due to pneumonia ([place]) 11/26/2021   Tobacco abuse    Past Surgical History:  Past Surgical History:  Procedure Laterality Date   ESOPHAGOGASTRODUODENOSCOPY (EGD) WITH PROPOFOL N/A 04/26/2019   Procedure: ESOPHAGOGASTRODUODENOSCOPY (EGD) WITH PROPOFOL;  Surgeon: Lin Landsman, MD;  Location: [facility] ENDOSCOPY;  Service: Gastroenterology;  Laterality: N/A;   LEFT HEART CATH AND CORONARY ANGIOGRAPHY N/A 01/09/2022   Procedure: LEFT HEART CATH AND CORONARY ANGIOGRAPHY;   Surgeon: Nelva Bush, MD;  Location: [facility];  Service: Cardiovascular;  Laterality: N/A;   NO PAST SURGERIES     TEE WITHOUT CARDIOVERSION N/A 04/22/2022   Procedure: TRANSESOPHAGEAL ECHOCARDIOGRAM (TEE);  Surgeon: Kate Sable, MD;  Location: [facility] ORS;  Service: Cardiovascular;  Laterality: N/A;   HPI:  Pt is a 67 y.o. male with medical history significant of recurrent strokes with residual left-sided deficits, known aspiration, HFpEF, CKD stage III, type 2 diabetes, bradycardia who presents to the ED due to generalized weakness. History obtained from patient and his wife as patient has dysarthria at baseline.  Wife states that patient has been experiencing decreased appetite and generalized weakness over the last several days.  Overnight, she noticed that he was having increased difficulty with breathing in addition to a cough that sounded wet.  She was attempting to help him cough up sputum but feels that his cough is too weak.  On arrival to the ED, patient was hypotensive at 85/54 with heart rate of 41.  He was afebrile at 98.1.  He was saturating at 99% on room air.  Initial workup remarkable for WBC of 3.2, hemoglobin of 9.2 and platelets of 143.  Lactic acid within normal limits x 2.   CXR: Left greater than right bibasilar infiltrates worrisome for  pneumonia -- pt has L sided weakness lying in chair/bed.    Assessment / Plan / Recommendation  Clinical Impression  Pt seen for BSE today. Pt awake, lying in bed in ED. Verbal but dysarthric and slow to  respond from previous CVAs(baseline per Wife). Contracted LUE. Wife present(focused on getting pt "cleaned up"). Wife endorsed pt has been placed on "thickened liquids by his speech therapist at home" - no objective swallow assessment noted per chart but did note the previous BSEs, MBSS, and assessments in recent ~3-4 years.   Pt appears to present w/ oropharyngeal phase dysphagia w/ moderate sensorimotor-based deficits and  Neuromuscular impact as per previous assessments. Pt has suspected declined Cognitive-linguistic status (baseline multiple CVAs) impacting his overall awareness/engagement during po tasks -- this can increase risk for aspiration. Pt required min-mod tactile/verbal/visual cues during bolus presentation, follow through w/ tasks, and feeding support.  Pt appears at increased risk for aspiration/aspiration pneumonia w/ risk reduced when following a modified diet consistency (Nectar liquids) as at home and when given feeding support following general aspiration precautions.   Pt consumed trials of Nectar liquids via tsp/cup/strw, purees, and minced solids fed to him. No overt clinical s/s of aspiration noted w/ trials; no cough and no decline in respiratory presentation noted during/post trials. O2 sats remained 97$+%. Oral phase was c/b min slow, deliberate bolus management and oral clearing w/ trials of increased texture; adequate bolus management and oral clearing of the Nectar liquids and purees given. The impaired mastication and coordination w/ increased textures suspect related to Edentulous status.  OM exam was revealed no unilateral lingual weakness or asymmetry. Contracted LUE impacting self-feeding. Wife stated she "gives him his foods at home".   Based on hx of sensorimotor oropharyngeal phase dysphagia (specifically silent aspiration of thin liquids) and current illness/hospitalization, recommend Dysphagia level 2 diet(Edentulous status) w/ Nectar liquids with aspiration precautions (slow rate, small bites, elevated HOB, and alert for PO intake). Feeding support and Supervision at meals. Reduce distractions during oral intake.  Education shared with pt/Wife regarding recommendations and plan for objective assessment next week. Wife agreed. NSG/MD updated. Precautions posted in chart/room. ST services will f/u on Monday.    SLP Visit Diagnosis: Dysphagia, oropharyngeal phase (R13.12) (baseline  Dysphagia; CVAs)    Aspiration Risk  Mild aspiration risk;Moderate aspiration risk;Risk for inadequate nutrition/hydration    Diet Recommendation   Dysphagia level 2 diet(Edentulous status) w/ Nectar liquids with aspiration precautions (slow rate, small bites, elevated HOB, and alert for PO intake). Feeding support and Supervision at meals. Reduce distractions during oral intake.   Medication Administration: Crushed with puree    Other  Recommendations Recommended Consults:  (Dietician f/u; Palliative Care for GOC, ed) Oral Care Recommendations: Oral care BID;Oral care before and after PO;Staff/trained caregiver to provide oral care Other Recommendations: Order thickener from pharmacy;Prohibited food (jello, ice cream, thin soups);Remove water pitcher;Have oral suction available    Recommendations for follow up therapy are one component of a multi-disciplinary discharge planning process, led by the attending physician.  Recommendations may be updated based on patient status, additional functional criteria and insurance authorization.  Follow up Recommendations Home health SLP (TBD)      Assistance Recommended at Discharge  full  Functional Status Assessment Patient has had a recent decline in their functional status and/or demonstrates limited ability to make significant improvements in function in a reasonable and predictable amount of time  Frequency and Duration min 2x/week  2 weeks       Prognosis Prognosis for Safe Diet Advancement: Fair Barriers to Reach Goals: Cognitive deficits;Language deficits;Time post onset;Severity of deficits Barriers/Prognosis Comment: baseline CVAs      Swallow Study   General Date of Onset: 08/21/22 HPI: Pt is a 67  y.o. male with medical history significant of recurrent strokes with residual left-sided deficits, known aspiration, HFpEF, CKD stage III, type 2 diabetes, bradycardia who presents to the ED due to generalized weakness. History obtained  from patient and his wife as patient has dysarthria at baseline.  Wife states that patient has been experiencing decreased appetite and generalized weakness over the last several days.  Overnight, she noticed that he was having increased difficulty with breathing in addition to a cough that sounded wet.  She was attempting to help him cough up sputum but feels that his cough is too weak.  On arrival to the ED, patient was hypotensive at 85/54 with heart rate of 41.  He was afebrile at 98.1.  He was saturating at 99% on room air.  Initial workup remarkable for WBC of 3.2, hemoglobin of 9.2 and platelets of 143.  Lactic acid within normal limits x 2.   CXR: Left greater than right bibasilar infiltrates worrisome for  pneumonia -- pt has L sided weakness lying in chair/bed. Type of Study: Bedside Swallow Evaluation Previous Swallow Assessment: BSEs 06/2022; 04/2022; 2021; MBSS 2020 - moderate sensorimotor based oropharyngeal dysphagia w/ delayed pharyngeal swallow initiation and decreased sensation to aspiration Diet Prior to this Study: Dysphagia 3 (soft);Dysphagia 2 (chopped) (thickened liquids - not sure which consistency. Nectar consistency rec'd in 04/2022) Temperature Spikes Noted: No (WBC not elevated) Respiratory Status: Room air History of Recent Intubation: No Behavior/Cognition: Alert;Cooperative;Pleasant mood;Confused;Distractible;Requires cueing (Dysarthria) Oral Cavity Assessment: Erythema (tongue - min) Oral Care Completed by SLP: Yes Oral Cavity - Dentition: Edentulous (doesn't wear his dentures per Wife) Vision:  (n/a) Self-Feeding Abilities: Total assist Patient Positioning: Upright in bed (needed full positioning support) Baseline Vocal Quality: Low vocal intensity (min-mod dysarthria) Volitional Cough: Strong Volitional Swallow: Able to elicit    Oral/Motor/Sensory Function Overall Oral Motor/Sensory Function: Within functional limits (protrusion/strength adequate)   Ice Chips Ice  chips: Not tested   Thin Liquid Thin Liquid: Not tested    Nectar Thick Nectar Thick Liquid: Within functional limits Presentation: Spoon;Straw (monitored sip size; ~3 ozs total)   Honey Thick Honey Thick Liquid: Not tested   Puree Puree: Within functional limits Presentation: Spoon (fed; 10+ trials)   Solid     Solid: Impaired (edentulous) Presentation: Spoon (fed; 8 trials) Oral Phase Impairments: Impaired mastication (edentulous) Oral Phase Functional Implications: Prolonged oral transit;Impaired mastication Pharyngeal Phase Impairments:  (none)        Jerilynn Som, MS, CCC-SLP Speech Language Pathologist Rehab Services; [facility] [phone] (ascom) Eldar Robitaille 08/22/2022,3:50 PM

## 2022-08-22 NOTE — ED Notes (Signed)
Pt given meal tray. Pt glucose 66 at this time.

## 2022-08-22 NOTE — ED Notes (Signed)
RN gave pt thicken grape juice with meds. RN encouraged pt wife to continue to get pt to drink the juice to make his BGL increase.

## 2022-08-23 LAB — COMPREHENSIVE METABOLIC PANEL
ALT: 96 U/L — ABNORMAL HIGH (ref 0–44)
AST: 84 U/L — ABNORMAL HIGH (ref 15–41)
Albumin: 2.7 g/dL — ABNORMAL LOW (ref 3.5–5.0)
Alkaline Phosphatase: 171 U/L — ABNORMAL HIGH (ref 38–126)
Anion gap: 9 (ref 5–15)
BUN: 27 mg/dL — ABNORMAL HIGH (ref 8–23)
CO2: 18 mmol/L — ABNORMAL LOW (ref 22–32)
Calcium: 8.5 mg/dL — ABNORMAL LOW (ref 8.9–10.3)
Chloride: 119 mmol/L — ABNORMAL HIGH (ref 98–111)
Creatinine, Ser: 2.19 mg/dL — ABNORMAL HIGH (ref 0.61–1.24)
GFR, Estimated: 33 mL/min — ABNORMAL LOW (ref >60)
Glucose, Bld: 55 mg/dL — ABNORMAL LOW (ref 70–99)
Potassium: 4 mmol/L (ref 3.5–5.1)
Sodium: 146 mmol/L — ABNORMAL HIGH (ref 135–145)
Total Bilirubin: 0.6 mg/dL (ref 0.3–1.2)
Total Protein: 7.1 g/dL (ref 6.5–8.1)

## 2022-08-23 LAB — CBC
HCT: 27 % — ABNORMAL LOW (ref 39.0–52.0)
Hemoglobin: 8.4 g/dL — ABNORMAL LOW (ref 13.0–17.0)
MCH: 27.2 pg (ref 26.0–34.0)
MCHC: 31.1 g/dL (ref 30.0–36.0)
MCV: 87.4 fL (ref 80.0–100.0)
Platelets: 136 10*3/uL — ABNORMAL LOW (ref 150–400)
RBC: 3.09 MIL/uL — ABNORMAL LOW (ref 4.22–5.81)
RDW: 19.9 % — ABNORMAL HIGH (ref 11.5–15.5)
WBC: 3.8 10*3/uL — ABNORMAL LOW (ref 4.0–10.5)
nRBC: 1.3 % — ABNORMAL HIGH (ref 0.0–0.2)

## 2022-08-23 LAB — GLUCOSE, CAPILLARY
Glucose-Capillary: 69 mg/dL — ABNORMAL LOW (ref 70–99)
Glucose-Capillary: 83 mg/dL (ref 70–99)
Glucose-Capillary: 95 mg/dL (ref 70–99)
Glucose-Capillary: 93 mg/dL (ref 70–99)
Glucose-Capillary: 91 mg/dL (ref 70–99)

## 2022-08-23 MED ORDER — GUAIFENESIN ER 600 MG PO TB12
1200.0000 mg | ORAL_TABLET | Freq: Two times a day (BID) | ORAL | Status: DC
  Administered 2022-08-23 – 2022-08-27 (×4): 1200 mg via ORAL
  Filled 2022-08-23 (×5): qty 2

## 2022-08-23 MED ORDER — RINGERS IV SOLN: INTRAVENOUS | Status: DC

## 2022-08-23 MED ORDER — NYSTATIN 100000 UNIT/ML MT SUSP
1.0000 mL | Freq: Four times a day (QID) | OROMUCOSAL | Status: AC
  Administered 2022-08-23 – 2022-08-25 (×7): 100000 [IU] via ORAL
  Filled 2022-08-23 (×9): qty 5

## 2022-08-23 NOTE — Progress Notes (Signed)
   08/23/22 1624  Assess: MEWS Score  Temp (!) 94.6 F (34.8 C) (Rectal Made MD aware and bear hugger placed back on patient)  Assess: MEWS Score  MEWS Temp 2  MEWS Systolic 0  MEWS Pulse 0  MEWS RR 0  MEWS LOC 0  MEWS Score 2  MEWS Score Color Yellow  Assess: if the MEWS score is Yellow or Red  Were vital signs taken at a resting state? Yes  Focused Assessment Change from prior assessment (see assessment flowsheet)  Does the patient meet 2 or more of the SIRS criteria? Yes  Does the patient have a confirmed or suspected source of infection? Yes  Provider and Rapid Response Notified? Yes  MEWS guidelines implemented *See Row Information* Yes  Treat  Pain Scale 0-10  Pain Score 0  Take Vital Signs  Increase Vital Sign Frequency  Yellow: Q 2hr X 2 then Q 4hr X 2, if remains yellow, continue Q 4hrs  Escalate  MEWS: Escalate Yellow: discuss with charge nurse/RN and consider discussing with provider and RRT  Notify: Charge Nurse/RN  Name of Charge Nurse/RN Notified Robyn, RN  Date Charge Nurse/RN Notified 08/23/22  Time Charge Nurse/RN Notified 1624  Provider Notification  Provider Name/Title Dr. Dwyane Dee  Date Provider Notified 08/23/22  Time Provider Notified 1626  Method of Notification  (message)  Notification Reason Change in status  Provider response No new orders;Other (Comment) (patient has on bear hugger)  Date of Provider Response 08/23/22  Time of Provider Response 1627  Document  Patient Outcome Stabilized after interventions  Progress note created (see row info) Yes  Assess: SIRS CRITERIA  SIRS Temperature  1  SIRS Pulse 0  SIRS Respirations  0  SIRS WBC 0  SIRS Score Sum  1

## 2022-08-23 NOTE — Progress Notes (Signed)
Unable to obtain temp orally, rectal temp noted to be 94.53F, warm blankets applied and recheck done. Temp 95.53F R. On call provider made aware and bear hugger ordered and applied. Recheck @0230  97.53F orally. Care ongoing.

## 2022-08-23 NOTE — Progress Notes (Signed)
       CROSS COVER NOTE  NAME: RONALD LONDO Sr. MRN: [id number] DOB : [birth year] ATTENDING PHYSICIAN: Oran Rein, MD    Date of Service   08/23/2022   HPI/Events of Note   Message received from RN reporting low Temp--> 94.5C  Interventions   Assessment/Plan:  Warming blanket       To reach the provider On-Call:   7AM- 7PM see care teams to locate the attending and reach out to them via [email or web address]. 7PM-7AM contact night-coverage If you still have difficulty reaching the appropriate provider, please page the [facility] (Director on Call) for [facility] on amion for assistance  This document was prepared using Set designer software and may include unintentional dictation errors.  Neomia Glass DNP, MBA, FNP-BC Nurse Practitioner [facility] Pager [phone]

## 2022-08-23 NOTE — Progress Notes (Signed)
FSBS 69 1 cup thickened juice given, pt tolerated same, oncoming RN made aware, report given. Will recheck, care ongoing.

## 2022-08-23 NOTE — Progress Notes (Signed)
. Progress Note   Evan Townsend: Evan Townsend DOB: May 05, 1956 DOA: 08/21/2022     1 DOS: the Evan Townsend was seen and examined on 08/23/2022   Brief hospital course:  Evan Townsend Sr. is a 67 y.o. male with medical history significant of recurrent strokes with residual left-sided deficits, known aspiration, HFpEF, CKD stage III, type 2 diabetes, bradycardia who presents to the ED due to generalized weakness. History obtained from Evan Townsend and his wife as Evan Townsend has dysarthria at baseline.   Mrs. Brumbaugh states that Evan Townsend has been experiencing decreased appetite and generalized weakness over the last several days.  Overnight, she noticed that he was having increased difficulty with breathing in addition to a cough that sounded wet.  She was attempting to help him cough up sputum but feels that his cough is too weak.  She states that SLP has been coming to their home for therapy, but she is still worried that he is silently aspirating.  At this time, Evan Townsend denies any shortness of breath, chest pain, palpitations.  They deny any known fevers, chills, nausea, vomiting, diarrhea, abdominal pain.   ED course: On arrival to the ED, Evan Townsend was hypotensive at 85/54 with heart rate of 41.  He was afebrile at 98.1.  He was saturating at 99% on room air.  Initial workup remarkable for WBC of 3.2, hemoglobin of 9.2 and platelets of 143.  CMP with bicarb of 20, BUN of 36, creatinine 2.53, alkaline phosphatase of 172, ALT of 68 and GFR of 27.  Lactic acid within normal limits x 2.  INR elevated at 1.6. Chest x-ray was obtained that  COVID-19 PCR, influenza PCR and RSV PCR negative. CXR showed left greater than right bibasilar infiltrates worrisome for pneumonia or aspiration.  TRH contacted for admission for aspiration pneumonia and AKI.  Assessment and Plan:  Aspiration pneumonia :  Evan Townsend presenting with several days of decreased appetite and weakness in the setting of known dysphagia with  aspiration in the past.  Evan Townsend's wife suspects he has been silently aspirating.  Not currently requiring oxygen, however will monitor closely for hypoxia.  - Continuous pulse oximetry - c/w  Unasyn 3 g every 6 hours, Mucinex for cough/sputum. - SLP evaluation- On Monday orTuesday  - Continue dysphagia 2 diet for now, per wife's request - Continue discussions with family regarding consideration of PEG tube  -- Evan Townsend complained of diarrhea on 1/6 no more episode on 1/7.  Will continue to watch.   Acute kidney injury superimposed on chronic kidney disease :improving with 2.53 > 2.35> 2.19 with improvement in BUN.   -AKI was likely secondary to dehydration from poor p.o. intake.  Advised wife to watch for intake. - S/p 2.5 L of IV fluids at the time of presentation in the ED. - Continue gentle hydration with ringer lactate @ 100 cc  - Trend lites with BMP - Monitor urine output : is negative since admission - Avoid nephrotoxic agents  Hypotension In the setting of dehydration, as Evan Townsend appears quite hypovolemic on examination.  Blood pressure has been responsive to IV fluids.  Low suspicion for sepsis at this time, however Evan Townsend is certainly at risk.  Due to this, will continue telemetry monitoring until evidence of clinical improvement.  - Telemetry monitoring - Continue IV fluids as noted above - Hold home antihypertensives  Recurrent strokes (HCC) No focal deficits on examination today that are new, so low suspicion for new CVA.  -Continue home Eliquis  Uncontrolled type 2 diabetes mellitus with hyperglycemia, with long-term current use of insulin (HCC) - Hold home Farxiga - SSI, sensitive  Sinus bradycardia Per chart review, Evan Townsend has a history of second-degree AV block with negative workup previously.   Asymptomatic from his bradycardia.  -Continue outpatient follow-up with cardiology.  Subjective:   Evan Townsend seen and examined this morning.  No overnight events.   Wife at bedside complains of Evan Townsend having thick phlegm requesting Mucinex.  Discussed about PEG placement wife not clear about it at this time.  Vital labs and imaging reviewed.  Physical Exam: Vitals:   08/23/22 0030 08/23/22 0115 08/23/22 0230 08/23/22 0543  BP:    (!) 161/77  Pulse:    77  Resp:    19  Temp: (!) 94.5 F (34.7 C) (!) 95.5 F (35.3 C) (!) 97.5 F (36.4 C) 97.8 F (36.6 C)  TempSrc:    Oral  SpO2:    98%  Weight:      Height:       No acute distress.  Head/ENT normal cephalic atraumatic.  Pupil equal reactive light commendation.  S1-S2 regular with no grade 3 murmur. -Equal air entry bilaterally with basal Rales pronounced on the right side. -Abdomen nontender nondistended. -Extremity with no edema. -Evan Townsend slow alert and oriented to time place and person.  Left upper and lower extremity strength 2/5 right upper and lower extremity strength 5/5 .facial symmetry preserved.  Data Reviewed:  There are no new results to review at this time.  Family Communication: Wife by bedside was made aware of Evan Townsend's situation.  Disposition: Status is: Inpatient Remains inpatient appropriate because: New onset diarrhea and dysphagia with ongoing aspiration pneumonia.    Planned Discharge Destination: Home    Time spent: 35 minutes  Author: Kirstie Peri, MD 08/23/2022 7:52 AM  For on call review [email or web address].

## 2022-08-24 ENCOUNTER — Ambulatory Visit: Payer: Self-pay | Admitting: *Deleted

## 2022-08-24 ENCOUNTER — Inpatient Hospital Stay: Payer: 59

## 2022-08-24 ENCOUNTER — Telehealth: Payer: Self-pay | Admitting: *Deleted

## 2022-08-24 DIAGNOSIS — A419 Sepsis, unspecified organism: Secondary | ICD-10-CM

## 2022-08-24 DIAGNOSIS — N179 Acute kidney failure, unspecified: Secondary | ICD-10-CM | POA: Diagnosis not present

## 2022-08-24 DIAGNOSIS — R1319 Other dysphagia: Secondary | ICD-10-CM

## 2022-08-24 DIAGNOSIS — J690 Pneumonitis due to inhalation of food and vomit: Secondary | ICD-10-CM | POA: Diagnosis not present

## 2022-08-24 DIAGNOSIS — R6521 Severe sepsis with septic shock: Secondary | ICD-10-CM

## 2022-08-24 LAB — COMPREHENSIVE METABOLIC PANEL
ALT: 102 U/L — ABNORMAL HIGH (ref 0–44)
AST: 86 U/L — ABNORMAL HIGH (ref 15–41)
Albumin: 2.6 g/dL — ABNORMAL LOW (ref 3.5–5.0)
Alkaline Phosphatase: 172 U/L — ABNORMAL HIGH (ref 38–126)
Anion gap: 9 (ref 5–15)
BUN: 21 mg/dL (ref 8–23)
CO2: 20 mmol/L — ABNORMAL LOW (ref 22–32)
Calcium: 8.7 mg/dL — ABNORMAL LOW (ref 8.9–10.3)
Chloride: 116 mmol/L — ABNORMAL HIGH (ref 98–111)
Creatinine, Ser: 2 mg/dL — ABNORMAL HIGH (ref 0.61–1.24)
GFR, Estimated: 36 mL/min — ABNORMAL LOW (ref >60)
Glucose, Bld: 66 mg/dL — ABNORMAL LOW (ref 70–99)
Potassium: 4 mmol/L (ref 3.5–5.1)
Sodium: 145 mmol/L (ref 135–145)
Total Bilirubin: 0.7 mg/dL (ref 0.3–1.2)
Total Protein: 7 g/dL (ref 6.5–8.1)

## 2022-08-24 LAB — CBC
HCT: 27.5 % — ABNORMAL LOW (ref 39.0–52.0)
Hemoglobin: 8.5 g/dL — ABNORMAL LOW (ref 13.0–17.0)
MCH: 26.9 pg (ref 26.0–34.0)
MCHC: 30.9 g/dL (ref 30.0–36.0)
MCV: 87 fL (ref 80.0–100.0)
Platelets: 120 10*3/uL — ABNORMAL LOW (ref 150–400)
RBC: 3.16 MIL/uL — ABNORMAL LOW (ref 4.22–5.81)
RDW: 19.1 % — ABNORMAL HIGH (ref 11.5–15.5)
WBC: 4.7 10*3/uL (ref 4.0–10.5)
nRBC: 0.8 % — ABNORMAL HIGH (ref 0.0–0.2)

## 2022-08-24 LAB — GLUCOSE, CAPILLARY
Glucose-Capillary: 68 mg/dL — ABNORMAL LOW (ref 70–99)
Glucose-Capillary: 86 mg/dL (ref 70–99)
Glucose-Capillary: 108 mg/dL — ABNORMAL HIGH (ref 70–99)
Glucose-Capillary: 81 mg/dL (ref 70–99)

## 2022-08-24 MED ORDER — DEXTROSE IN LACTATED RINGERS 5 % IV SOLN: INTRAVENOUS | Status: DC

## 2022-08-24 MED ORDER — SODIUM CHLORIDE 0.9 % IV SOLN
3.0000 g | Freq: Four times a day (QID) | INTRAVENOUS | Status: AC
  Administered 2022-08-24 – 2022-08-27 (×13): 3 g via INTRAVENOUS
  Filled 2022-08-24 (×2): qty 8
  Filled 2022-08-24 (×3): qty 3
  Filled 2022-08-24: qty 8
  Filled 2022-08-24 (×3): qty 3
  Filled 2022-08-24 (×2): qty 8
  Filled 2022-08-24 (×2): qty 3
  Filled 2022-08-24 (×2): qty 8
  Filled 2022-08-24: qty 3

## 2022-08-24 MED ORDER — HYDRALAZINE HCL 20 MG/ML IJ SOLN
5.0000 mg | Freq: Three times a day (TID) | INTRAMUSCULAR | Status: DC
  Administered 2022-08-24 – 2022-08-25 (×2): 5 mg via INTRAVENOUS
  Filled 2022-08-24 (×2): qty 1

## 2022-08-24 NOTE — Progress Notes (Signed)
. Progress Note   Patient: Evan Townsend IRJ:[id number] DOB: April 15, 1956 DOA: 08/21/2022     2 DOS: the patient was seen and examined on 08/24/2022   Brief hospital course:  Evan Townsend Sr. is a 67 y.o. male with medical history significant of recurrent strokes with residual left-sided deficits, known aspiration, HFpEF, CKD stage III, type 2 diabetes, bradycardia who presents to the ED due to generalized weakness. History obtained from patient and his wife as patient has dysarthria at baseline.   Evan Townsend states that patient has been experiencing decreased appetite and generalized weakness over the last several days.  Overnight, she noticed that he was having increased difficulty with breathing in addition to a cough that sounded wet.  She was attempting to help him cough up sputum but feels that his cough is too weak.  She states that SLP has been coming to their home for therapy, but she is still worried that he is silently aspirating.  At this time, Evan Townsend denies any shortness of breath, chest pain, palpitations.  They deny any known fevers, chills, nausea, vomiting, diarrhea, abdominal pain.   ED course: On arrival to the ED, patient was hypotensive at 85/54 with heart rate of 41.  He was afebrile at 98.1.  He was saturating at 99% on room air.  Initial workup remarkable for WBC of 3.2, hemoglobin of 9.2 and platelets of 143.  CMP with bicarb of 20, BUN of 36, creatinine 2.53, alkaline phosphatase of 172, ALT of 68 and GFR of 27.  Lactic acid within normal limits x 2.  INR elevated at 1.6. Chest x-ray was obtained that  COVID-19 PCR, influenza PCR and RSV PCR negative. CXR showed left greater than right bibasilar infiltrates worrisome for pneumonia or aspiration.  TRH contacted for admission for aspiration pneumonia and AKI.  Assessment and Plan:  Aspiration pneumonia :  Patient presenting with several days of decreased appetite and weakness in the setting of known dysphagia with  aspiration in the past. Swallow study today confirms worsening dysphagia since previous studies. SLP recommends continuing NPO indefinitely. Discussed palliative feeds vs enteral feeds with wife. Included that risk of aspiration remains even with PEG due to even being able to aspirate saliva. She would like to proceed with PEG placement. She was agreeable to discussions with palliative.  - general surgery consulted, appreciate your care  - holding eliquis - palliative consulted.  - c/w  Unasyn 3 g every 6 hours, Mucinex for cough/sputum. - SLP evaluation  Hypoglycemia- due to NPO status - discontinued insulin - continue to monitor CBGs - dextrose IV - electrolyte monitoring frequently  Acute kidney injury superimposed on chronic kidney disease :improving with 2.53 > 2.35> 2.19>2.0 with improvement in BUN.  - continue IV fluids as above. Cannot meet his hydration needs by PO - Trend lites with BMP - Monitor urine output : is negative since admission - Avoid nephrotoxic agents  Hypertension - add back on home antihypertensives as tolerated.   Recurrent strokes (HCC) No focal deficits on examination today that are new, so low suspicion for new CVA. -Continue home Eliquis  Uncontrolled type 2 diabetes mellitus with hyperglycemia, with long-term current use of insulin (Evan Townsend) - Hold home Farxiga  Sinus bradycardia Per chart review, patient has a history of second-degree AV block with negative workup previously.   Asymptomatic from his bradycardia. -Continue outpatient follow-up with cardiology.  Subjective:  Patient seen and examined this morning.  No overnight events.  He denies  dyspnea, CP. He feels ready to go home. Wife awaiting SLP evaluation today.    Physical Exam: Vitals:   08/23/22 1757 08/23/22 2054 08/24/22 0011 08/24/22 0428  BP:  (!) 143/80 (!) 155/80 (!) 164/91  Pulse:  72 68 78  Resp:  18 18 17   Temp: (!) 95 F (35 C) 97.7 F (36.5 C) 97.9 F (36.6 C) (!) 97.5  F (36.4 C)  TempSrc:  Oral Oral Oral  SpO2:  97% 97% 98%  Weight:      Height:       No acute distress.  Head/ENT normal cephalic atraumatic.  Pupil equal reactive light commendation.  S1-S2 regular with no grade 3 murmur. -Equal air entry bilaterally with basal Rales pronounced on the right side. -Abdomen nontender nondistended. -Extremity with no edema. -Patient slow alert and oriented to time place and person.  Left upper and lower extremity strength 2/5 right upper and lower extremity strength 5/5 .facial symmetry preserved.  Data Reviewed:  CBC    Component Value Date/Time   WBC 4.7 08/24/2022 0239   RBC 3.16 (L) 08/24/2022 0239   HGB 8.5 (L) 08/24/2022 0239   HGB 9.7 (L) 06/09/2022 1625   HCT 27.5 (L) 08/24/2022 0239   HCT 29.6 (L) 06/09/2022 1625   PLT 120 (L) 08/24/2022 0239   PLT 230 06/09/2022 1625   MCV 87.0 08/24/2022 0239   MCV 83 06/09/2022 1625   MCV 88 03/15/2013 1649   MCH 26.9 08/24/2022 0239   MCHC 30.9 08/24/2022 0239   RDW 19.1 (H) 08/24/2022 0239   RDW 18.4 (H) 06/09/2022 1625   RDW 14.5 03/15/2013 1649   LYMPHSABS 0.5 (L) 08/21/2022 1324   LYMPHSABS 1.7 06/09/2022 1625   LYMPHSABS 2.7 03/15/2013 1649   MONOABS 0.2 08/21/2022 1324   MONOABS 0.5 03/15/2013 1649   EOSABS 0.0 08/21/2022 1324   EOSABS 0.1 06/09/2022 1625   EOSABS 0.1 03/15/2013 1649   BASOSABS 0.0 08/21/2022 1324   BASOSABS 0.0 06/09/2022 1625   BASOSABS 0.1 03/15/2013 1649      Latest Ref Rng & Units 08/24/2022    2:39 AM 08/23/2022    3:42 AM 08/22/2022    4:50 PM  BMP  Glucose 70 - 99 mg/dL 66  55  85   BUN 8 - 23 mg/dL 21  27  31    Creatinine 0.61 - 1.24 mg/dL 10/21/2022   4.62   Sodium 135 - 145 mmol/L 145  146  145   Potassium 3.5 - 5.1 mmol/L 4.0  4.0  4.0   Chloride 98 - 111 mmol/L 116  119  118   CO2 22 - 32 mmol/L 20  18  18    Calcium 8.9 - 10.3 mg/dL 8.7  8.5  8.3     Family Communication: Wife on phone  Disposition: Status is: Inpatient Remains inpatient  appropriate because: worsening dysphagia requiring PEG   Planned Discharge Destination: Home  Time spent: 35 minutes  Author: 7.03, MD 08/24/2022 7:03 AM  For on call review [email or web address]

## 2022-08-24 NOTE — Patient Instructions (Signed)
Visit Information  Thank you for taking time to visit with me today. Please don't hesitate to contact me if I can be of assistance to you.   Following are the goals we discussed today:   Goals Addressed             This Visit's Progress    Per spouse "I would like to be paid to care for my husband"       Care Coordination Interventions: Follow up phone call to patient's spouse who informed this social worker that patient has been hospitalized with pneumonia Discharge date pending-patient may be going home with Oxygen Confirmed that patient has not been contacted to schedule in home assessment for the CAPS program  Collaboration phone call to [facility] to follow up with CAPS referral, re-confirmed that the application has been received and is ready for nursing assessment to be scheduled-explained that multiple calls have been placed with no return call Left additional message with the scheduler for the CAPS assessment, email sent has well to scheduler by customer service Patient's spouse continues to discuss need for CAPS services and desire to provide care for patient-specifically now that he has been hospitalized          Our next appointment is by telephone on 09/09/21 at 1pm  Please call the care guide team at [phone] if you need to cancel or reschedule your appointment.   If you are experiencing a Mental Health or [place] or need someone to talk to, please call 911   Patient verbalizes understanding of instructions and care plan provided today and agrees to view in [place]. Active MyChart status and patient understanding of how to access instructions and care plan via MyChart confirmed with patient.     Telephone follow up appointment with care management team member scheduled for: 09/09/22  Elliot Gurney, [place] Worker  [facility] [phone]

## 2022-08-24 NOTE — Evaluation (Signed)
Objective Swallowing Evaluation: Type of Study: MBS-Modified Barium Swallow Study   Patient Details  Name: Evan GALLARDO Sr. MRN: [id number] Date of Birth: 1956-03-12  Today's Date: 08/24/2022 Time: SLP Start Time (ACUTE ONLY): 1200 -SLP Stop Time (ACUTE ONLY): 1315  SLP Time Calculation (min) (ACUTE ONLY): 75 min   Past Medical History:  Past Medical History:  Diagnosis Date   Acute CVA (cerebrovascular accident) (HCC) 06/05/2021   Acute ischemic stroke (HCC) 04/22/2022   CAP (community acquired pneumonia) 11/26/2021   Carotid arterial disease (HCC)    a. 08/2018 Carotid U/S: min-mod RICA atherosclerosis w/o hemodynamically significant stenosis. Nl LICA.   Diabetes 1.5, managed as type 2 (HCC)    Diastolic dysfunction    a. 08/2018 Echo: EF 65%. No rwma. Gr1 DD. Mild MR.   Diastolic dysfunction    a. 08/2019 Echo: EF 55-60%, Gr1 DD. No rwma. Mild MR. RVSP 37.64mmHg.   Hypercholesterolemia    Hypertension    PAF (paroxysmal atrial fibrillation) (HCC)    a. 10/2019 Event Monitor: PAF; b. CHA2DS2VASc = 5-->Eliquis.   Poorly controlled diabetes mellitus (HCC)    a. 04/2019 A1c 13.8.   Recurrent strokes (HCC)    a. 10/2016 MRI/A: Acute 74mm R thalamic infarct, ? subacute infarct of R corona radiata; b. 08/2017 MRI/A: Acute 38mm lateral L thalamic infarct. Other more remote lacunar infarcts of thalami bilat. Small vessel dzs; c. 08/2018 MRI/A: Acute lacunar infarct of the post limb of R internal capsule; d. 08/2019 MRI Acute CVA of L paramedian pons adn R cerebellar hemisphere.   Sepsis due to pneumonia (HCC) 11/26/2021   Tobacco abuse    Past Surgical History:  Past Surgical History:  Procedure Laterality Date   ESOPHAGOGASTRODUODENOSCOPY (EGD) WITH PROPOFOL N/A 04/26/2019   Procedure: ESOPHAGOGASTRODUODENOSCOPY (EGD) WITH PROPOFOL;  Surgeon: Toney Reil, MD;  Location: [facility] ENDOSCOPY;  Service: Gastroenterology;  Laterality: N/A;   LEFT HEART CATH AND CORONARY ANGIOGRAPHY N/A 01/09/2022    Procedure: LEFT HEART CATH AND CORONARY ANGIOGRAPHY;  Surgeon: [name], MD;  Location: [facility] INVASIVE CV LAB;  Service: Cardiovascular;  Laterality: N/A;   NO PAST SURGERIES     TEE WITHOUT CARDIOVERSION N/A 04/22/2022   Procedure: TRANSESOPHAGEAL ECHOCARDIOGRAM (TEE);  Surgeon: Debbe Odea, MD;  Location: [facility] ORS;  Service: Cardiovascular;  Laterality: N/A;   HPI: Pt is a 67 y.o. male with medical history significant of recurrent strokes with residual left-sided deficits, known aspiration, HFpEF, CKD stage III, type 2 diabetes, bradycardia who presents to the ED due to generalized weakness. History obtained from patient and his wife as patient has dysarthria at baseline.  Wife states that patient has been experiencing decreased appetite and generalized weakness over the last several days.  Overnight, she noticed that he was having increased difficulty with breathing in addition to a cough that sounded wet.  She was attempting to help him cough up sputum but feels that his cough is too weak.  On arrival to the ED, patient was hypotensive at 85/54 with heart rate of 41.  He was afebrile at 98.1.  He was saturating at 99% on room air.  Initial workup remarkable for WBC of 3.2, hemoglobin of 9.2 and platelets of 143.  Lactic acid within normal limits x 2.   CXR: Left greater than right bibasilar infiltrates worrisome for  pneumonia -- pt has L sided weakness lying in chair/bed.   Subjective: pt awake, followed 1-step commands. Verbal but dysarthric from previous CVAs. Contracted LUE.    Recommendations  for follow up therapy are one component of a multi-disciplinary discharge planning process, led by the attending physician.  Recommendations may be updated based on patient status, additional functional criteria and insurance authorization.  Assessment / Plan / Recommendation     08/24/2022    5:00 PM  Clinical Impressions  Clinical Impression Pt presents w/ severe oropharyngeal phase  dsyphagia w/ sensorimotor deficits.  This resulted in SILENT aspiration of thin and Nectar liquids(before and during the swallow); MODERATE+ delay in pharyngeal swallow w/ Honey consistency liquids and Puree. This much of a modified food/liquid consistency may jeopardize pt's ability to meet hydration/nutrition needs adequately.  Pt also exhibits Moderate oral phase deficits c/b decreased lingual strength and awareness of bolus residue, decreased bolus cohesion/control w/ premature spillage of liquids into the pharynx but slow bolus A-P transfer of the increased texture trials; reduced oral clearing of oral residue b/t trials thus increasing risk for aspiration after the swallow from oral residue coating the pharynx and entering the laryngeal vestibule. Pt is at HIGH risk for aspiration/aspiration pneumonia based on the results of this MBSS.  The above was discussed w/ MD post evaluation; the recommendation for NPO status w/ frequent oral care for hygiene and stimulation of swallowing. MD to discuss w/ Wife pt's GOC including alternative means of feeding; Palliative Care f/u w/ pt/Wife. NSG updated.  SLP Visit Diagnosis Dysphagia, oropharyngeal phase (R13.12)  Impact on safety and function Severe aspiration risk;Risk for inadequate nutrition/hydration         08/24/2022    5:00 PM  Treatment Recommendations  Treatment Recommendations Defer treatment plan to f/u with SLP        08/24/2022    5:00 PM  Prognosis  Prognosis for Safe Diet Advancement Guarded  Barriers to Reach Goals Cognitive deficits;Language deficits;Time post onset;Severity of deficits  Barriers/Prognosis Comment baseline CVAs and Dysphagia       08/24/2022    5:00 PM  Diet Recommendations  SLP Diet Recommendations NPO;Alternative means - long-term  Medication Administration Via alternative means         08/24/2022    5:00 PM  Other Recommendations  Recommended Consults --  Oral Care Recommendations Oral care  QID;Staff/trained caregiver to provide oral care  Follow Up Recommendations Home health SLP  Functional Status Assessment Patient has had a recent decline in their functional status and/or demonstrates limited ability to make significant improvements in function in a reasonable and predictable amount of time       08/24/2022    5:00 PM  Frequency and Duration   Speech Therapy Frequency (ACUTE ONLY) --  Treatment Duration --         08/24/2022    5:00 PM  Oral Phase  Oral Phase Impaired  Oral - Honey Teaspoon Lingual/palatal residue;Decreased bolus cohesion;Delayed oral transit;Weak lingual manipulation  Oral - Nectar Teaspoon Lingual/palatal residue;Decreased bolus cohesion;Premature spillage  Oral - Nectar Cup Lingual/palatal residue;Delayed oral transit;Decreased bolus cohesion  Oral - Thin Teaspoon Lingual/palatal residue;Decreased bolus cohesion;Premature spillage  Oral - Puree Weak lingual manipulation;Lingual/palatal residue;Delayed oral transit;Decreased bolus cohesion  Oral Phase - Comment oral residue noted post all trials; decreased bolus control/cohesion during A-P transfer w/ premature spillage into the pharynx. Slow, deliberate/weak lingual manipulation. Adequate bolus control during Hold instruction.       08/24/2022    5:00 PM  Pharyngeal Phase  Pharyngeal Phase Impaired  Pharyngeal- Honey Teaspoon Delayed swallow initiation-vallecula  Pharyngeal- Nectar Teaspoon Delayed swallow initiation-pyriform sinuses;Reduced airway/laryngeal closure;Penetration/Aspiration during swallow;Significant aspiration (Amount)  Pharyngeal- Nectar Straw Delayed swallow initiation-pyriform sinuses;Reduced airway/laryngeal closure;Penetration/Aspiration during swallow;Significant aspiration (Amount)  Pharyngeal- Thin Teaspoon Delayed swallow initiation-pyriform sinuses;Reduced airway/laryngeal closure;Penetration/Aspiration before swallow;Significant aspiration (Amount)  Pharyngeal- Puree Delayed  swallow initiation-vallecula  Pharyngeal Comment SEVERELY Delayed pharyngeal swallow initiation w/ thin and Nectar liquid consistencies filling the pyriform sinuses b/f a pharyngeal swallow initiated. This resulted in SILENT aspiration before and during the swallow; decreased airway closure and protection. Trials of Honey liquids and Puree triggered the pharyngeal swallow at/spilling from the Valleculae post Time (significant delay). Adequate pharyngeal pressure and laryngeal excursion w/ no significant bolus residue remaining in the pharynx.  PT WAS UNABLE TO EFFECTIVELY COUGH TO CLEAR THE ASPIRATED MATERIALS WHEN CUED.        08/24/2022    5:00 PM  Cervical Esophageal Phase   Cervical Esophageal Phase [facility]          Orinda Kenner, MS, CCC-SLP Speech Language Pathologist Rehab Services; [place] [phone] (ascom) Shiann Kam 08/24/2022, 5:28 PM

## 2022-08-24 NOTE — Progress Notes (Signed)
PHARMACY NOTE:  ANTIMICROBIAL RENAL DOSAGE ADJUSTMENT  Current antimicrobial regimen includes a mismatch between antimicrobial dosage and estimated renal function.  As per policy approved by the Pharmacy & Therapeutics and Medical Executive Committees, the antimicrobial dosage will be adjusted accordingly.  Current antimicrobial dosage:  Unasyn 3 gm IV q12h  Indication: aspiration PNA  Renal Function:  Estimated Creatinine Clearance: 33.2 mL/min (A) (by C-G formula based on SCr of 2 mg/dL (H)).     Antimicrobial dosage has been changed to:   Unasyn 3 gm IV q6h  for Crcl > 30 ml/min  Additional comments:   Thank you for allowing pharmacy to be a part of this patient's care.  Ballard Budney A, [facility] 08/24/2022 8:51 AM

## 2022-08-24 NOTE — Patient Outreach (Signed)
  Care Coordination   Follow Up Visit Note   08/24/2022 Name: Evan STICKELS Sr. MRN: [id number] DOB: May 25, 1956  Evan Cones Sr. is a 67 y.o. year old male who sees Jon Billings, NP for primary care. I spoke with Rise Paganini, wife of  Evan PANNONE Sr. by phone today.  What matters to the patients health and wellness today?  Currently admitted to hospital for aspiration pneumonia    Goals Addressed             This Visit's Progress    Management of chronic medical conditions       Care Coordination Interventions: Evaluation of current treatment plan related to A-fib, residual from stroke, cystitis and patient's adherence to plan as established by provider Advised patient to notify provider if she finds more blood in urine Collaborated with CSW regarding wife getting paid through CAPS to care for patient Reviewed scheduled/upcoming provider appointments including Urology on 1/16 and cardiology on 1/17 Care Guide referral for difficulty paying utility bills Discussed plans with patient for ongoing care management follow up and provided patient with direct contact information for care management team Per wife, patient will be getting feeding tube placed due to recurrent episodes of aspiration pneumonia. Collaborated with CSW regarding plan of care once patient is discharged         SDOH assessments and interventions completed:  No     Care Coordination Interventions:  Yes, provided   Follow up plan:  Pending hospital discharge    Encounter Outcome:  Pt. Visit Completed   Valente David, RN, MSN, [place] [facility] Management Care Management Coordinator [phone]

## 2022-08-24 NOTE — H&P (View-Only) (Signed)
Subjective:   CC: dysphagia  HPI:  Evan E Kuhlman Sr. is a 66 y.o. male who was consulted by [facility] for evaluation of above.  Hx of stroke, recent concerns for aspiration pna.  Failed swallow eval so requested PEG tube.   Past Medical History:  has a past medical history of Acute CVA (cerebrovascular accident) (HCC) (06/05/2021), Acute ischemic stroke (HCC) (04/22/2022), CAP (community acquired pneumonia) (11/26/2021), Carotid arterial disease (HCC), Diabetes 1.5, managed as type 2 (HCC), Diastolic dysfunction, Diastolic dysfunction, Hypercholesterolemia, Hypertension, PAF (paroxysmal atrial fibrillation) (HCC), Poorly controlled diabetes mellitus (HCC), Recurrent strokes (HCC), Sepsis due to pneumonia (HCC) (11/26/2021), and Tobacco abuse.  Past Surgical History:  has a past surgical history that includes Esophagogastroduodenoscopy (egd) with propofol (N/A, 04/26/2019); No past surgeries; LEFT HEART CATH AND CORONARY ANGIOGRAPHY (N/A, 01/09/2022); and TEE without cardioversion (N/A, 04/22/2022).  Family History: family history includes Diabetes in his father; Heart attack in his father; Hypertension in his father and mother; Stroke in his mother.  Social History:  reports that he has quit smoking. His smoking use included cigarettes. He started smoking about 42 years ago. He has a 38.00 pack-year smoking history. He has never used smokeless tobacco. He reports that he does not currently use alcohol after a past usage of about 1.0 standard drink of alcohol per week. He reports that he does not currently use drugs after having used the following drugs: Cocaine and Marijuana.  Current Medications:  Prior to Admission medications   Medication Sig Start Date End Date Taking? Authorizing Provider  albuterol (VENTOLIN HFA) 108 (90 Base) MCG/ACT inhaler Inhale 2 puffs into the lungs every 4 (four) hours as needed for wheezing or shortness of breath. 07/17/22  Yes Holdsworth, Karen, NP  amLODipine (NORVASC) 10  MG tablet TAKE 1 TABLET BY MOUTH DAILY 06/11/22  Yes Holdsworth, Karen, NP  atorvastatin (LIPITOR) 80 MG tablet TAKE 1 TABLET BY MOUTH EVERY EVENING 06/11/22  Yes Holdsworth, Karen, NP  dapagliflozin propanediol (FARXIGA) 10 MG TABS tablet Take 1 tablet (10 mg total) by mouth daily before breakfast. 07/02/22  Yes Holdsworth, Karen, NP  ELIQUIS 5 MG TABS tablet TAKE 1 TABLET BY MOUTH TWICE A DAY 06/11/22  Yes Holdsworth, Karen, NP  losartan (COZAAR) 25 MG tablet TAKE 1 TABLET BY MOUTH DAILY 08/05/22  Yes Holdsworth, Karen, NP  melatonin 1 MG TABS tablet Take 3 tablets (3 mg total) by mouth at bedtime as needed. Patient taking differently: Take 1 mg by mouth at bedtime. 04/23/22  Yes Griffith, Kelly A, DO  mirtazapine (REMERON) 7.5 MG tablet TAKE ONE TABLET BY MOUTH AT BEDTIME 08/05/22  Yes Holdsworth, Karen, NP  nitroGLYCERIN (NITROSTAT) 0.4 MG SL tablet Place 1 tablet (0.4 mg total) under the tongue every 5 (five) minutes as needed for chest pain. 07/02/22  Yes Holdsworth, Karen, NP  omeprazole (PRILOSEC) 40 MG capsule TAKE 1 CAPSULE BY MOUTH IN THE MORNING AND AT BEDTIME 06/11/22  Yes Holdsworth, Karen, NP  pregabalin (LYRICA) 50 MG capsule Take 50 mg by mouth 2 (two) times daily.   Yes [provider]  tamsulosin (FLOMAX) 0.4 MG CAPS capsule Take 1 capsule (0.4 mg total) by mouth daily. 02/25/22  Yes Sninsky, Brian C, MD  iron polysaccharides (NIFEREX) 150 MG capsule Take 1 capsule (150 mg total) by mouth daily. Patient not taking: Reported on 08/21/2022 06/10/22   Holdsworth, Karen, NP  metoCLOPramide (REGLAN) 5 MG tablet Take 1 tablet (5 mg total) by mouth every 8 (eight) hours as needed for nausea.   Patient not taking: Reported on 08/21/2022 07/02/22   Jon Billings, NP    Allergies:  No Known Allergies  ROS:  Unable to obtain secondary to patient baseline status    Objective:     BP (!) 166/89 (BP Location: Left Arm)   Pulse (!) 55   Temp 97.7 F (36.5 C)   Resp 17   Ht 5'  6" (1.676 m)   Wt 64.8 kg   SpO2 100%   BMI 23.06 kg/m   Constitutional :  alert and no distress  Lymphatics/Throat:  no asymmetry, masses, or scars        Gastrointestinal: soft, non-tender; bowel sounds normal; no masses,  no organomegaly.  Musculoskeletal: Steady gait and movement  Skin: Cool and moist, no surgical scars  Psychiatric: Normal affect, non-agitated, not confused       LABS:     Latest Ref Rng & Units 08/24/2022    2:39 AM 08/23/2022    3:42 AM 08/22/2022    4:50 PM  CMP  Glucose 70 - 99 mg/dL 66  55  85   BUN 8 - 23 mg/dL 21  27  31    Creatinine 0.61 - 1.24 mg/dL 2.00  2.19  2.35   Sodium 135 - 145 mmol/L 145  146  145   Potassium 3.5 - 5.1 mmol/L 4.0  4.0  4.0   Chloride 98 - 111 mmol/L 116  119  118   CO2 22 - 32 mmol/L 20  18  18    Calcium 8.9 - 10.3 mg/dL 8.7  8.5  8.3   Total Protein 6.5 - 8.1 g/dL 7.0  7.1    Total Bilirubin 0.3 - 1.2 mg/dL 0.7  0.6    Alkaline Phos 38 - 126 U/L 172  171    AST 15 - 41 U/L 86  84    ALT 0 - 44 U/L 102  96        Latest Ref Rng & Units 08/24/2022    2:39 AM 08/23/2022    3:42 AM 08/22/2022    4:50 PM  CBC  WBC 4.0 - 10.5 K/uL 4.7  3.8  3.5   Hemoglobin 13.0 - 17.0 g/dL 8.5  8.4  8.4   Hematocrit 39.0 - 52.0 % 27.5  27.0  27.3   Platelets 150 - 400 K/uL 120  136  132     RADS: N/a  Assessment:     Dysphagia secondary to stroke.  PEG requested for airway protection and prevention of aspiration PNA   Plan:     1. Alternatives include continued observation.  Benefits include maintaining nutrition.  Discussed the risk of surgery including post-op infxn, dysfunction, injury to surrounding organs and possible re-operation to address said risks. The risks of general anesthetic, if used, includes MI, CVA, sudden death or even reaction to anesthetic medications also discussed.   The patient's wife who is POA verbalized understanding and all questions were answered to the patient's satisfaction. She understands this  procedure does not change his overall mentation or ability to resume oral feeds.   To OR likely in two days for eliquis to wear off.  Will also confirm [place] available and pt wife able to proceed with tube feeds at home.  labs/images/medications/previous chart entries reviewed personally and relevant changes/updates noted above.

## 2022-08-24 NOTE — Patient Outreach (Signed)
  Care Coordination   Follow Up Visit Note   08/24/2022 Name: Evan LOTT Sr. MRN: [id number] DOB: [birth year]  Evan Cones Sr. is a 67 y.o. year old male who sees Jon Billings, NP for primary care. I spoke with  Evan Cones Sr. 's spouse by phone today.  What matters to the patients health and wellness today?  CAPS    Goals Addressed             This Visit's Progress    Per spouse "I would like to be paid to care for my husband"       Care Coordination Interventions: Follow up phone call to patient's spouse who informed this social worker that patient has been hospitalized with pneumonia-he will likely need a feeding tube Per patient's spouse, she was contacted by [facility] and patient has been scheduled for an assessment for CAPS for 09/09/22 at 12:00pm to start services Spouse now asking if assessment can be moved up, patient's spouse provided with [facility] to see if they would be able to move the assessment to an earlier date [phone] Patient's spouse encouraged to call this social worker with any additional community resource needs          SDOH assessments and interventions completed:  No     Care Coordination Interventions:  Yes, provided   Follow up plan: No further intervention required.   Encounter Outcome:  Pt. Visit Completed

## 2022-08-24 NOTE — Consult Note (Signed)
Subjective:   CC: dysphagia  HPI:  Evan Townsend Sr. is a 67 y.o. male who was consulted by [facility] for evaluation of above.  Hx of stroke, recent concerns for aspiration pna.  Failed swallow eval so requested PEG tube.   Past Medical History:  has a past medical history of Acute CVA (cerebrovascular accident) (HCC) (06/05/2021), Acute ischemic stroke (HCC) (04/22/2022), CAP (community acquired pneumonia) (11/26/2021), Carotid arterial disease (HCC), Diabetes 1.5, managed as type 2 (HCC), Diastolic dysfunction, Diastolic dysfunction, Hypercholesterolemia, Hypertension, PAF (paroxysmal atrial fibrillation) (HCC), Poorly controlled diabetes mellitus (HCC), Recurrent strokes (HCC), Sepsis due to pneumonia (HCC) (11/26/2021), and Tobacco abuse.  Past Surgical History:  has a past surgical history that includes Esophagogastroduodenoscopy (egd) with propofol (N/A, 04/26/2019); No past surgeries; LEFT HEART CATH AND CORONARY ANGIOGRAPHY (N/A, 01/09/2022); and TEE without cardioversion (N/A, 04/22/2022).  Family History: family history includes Diabetes in his father; Heart attack in his father; Hypertension in his father and mother; Stroke in his mother.  Social History:  reports that he has quit smoking. His smoking use included cigarettes. He started smoking about 42 years ago. He has a 38.00 pack-year smoking history. He has never used smokeless tobacco. He reports that he does not currently use alcohol after a past usage of about 1.0 standard drink of alcohol per week. He reports that he does not currently use drugs after having used the following drugs: Cocaine and Marijuana.  Current Medications:  Prior to Admission medications   Medication Sig Start Date End Date Taking? Authorizing Provider  albuterol (VENTOLIN HFA) 108 (90 Base) MCG/ACT inhaler Inhale 2 puffs into the lungs every 4 (four) hours as needed for wheezing or shortness of breath. 07/17/22  Yes Larae Grooms, NP  amLODipine (NORVASC) 10  MG tablet TAKE 1 TABLET BY MOUTH DAILY 06/11/22  Yes Larae Grooms, NP  atorvastatin (LIPITOR) 80 MG tablet TAKE 1 TABLET BY MOUTH EVERY EVENING 06/11/22  Yes Larae Grooms, NP  dapagliflozin propanediol (FARXIGA) 10 MG TABS tablet Take 1 tablet (10 mg total) by mouth daily before breakfast. 07/02/22  Yes Larae Grooms, NP  ELIQUIS 5 MG TABS tablet TAKE 1 TABLET BY MOUTH TWICE A DAY 06/11/22  Yes Larae Grooms, NP  losartan (COZAAR) 25 MG tablet TAKE 1 TABLET BY MOUTH DAILY 08/05/22  Yes Larae Grooms, NP  melatonin 1 MG TABS tablet Take 3 tablets (3 mg total) by mouth at bedtime as needed. Patient taking differently: Take 1 mg by mouth at bedtime. 04/23/22  Yes Esaw Grandchild A, DO  mirtazapine (REMERON) 7.5 MG tablet TAKE ONE TABLET BY MOUTH AT BEDTIME 08/05/22  Yes Larae Grooms, NP  nitroGLYCERIN (NITROSTAT) 0.4 MG SL tablet Place 1 tablet (0.4 mg total) under the tongue every 5 (five) minutes as needed for chest pain. 07/02/22  Yes Larae Grooms, NP  omeprazole (PRILOSEC) 40 MG capsule TAKE 1 CAPSULE BY MOUTH IN THE MORNING AND AT BEDTIME 06/11/22  Yes Larae Grooms, NP  pregabalin (LYRICA) 50 MG capsule Take 50 mg by mouth 2 (two) times daily.   Yes [provider]  tamsulosin (FLOMAX) 0.4 MG CAPS capsule Take 1 capsule (0.4 mg total) by mouth daily. 02/25/22  Yes Sondra Come, MD  iron polysaccharides (NIFEREX) 150 MG capsule Take 1 capsule (150 mg total) by mouth daily. Patient not taking: Reported on 08/21/2022 06/10/22   Larae Grooms, NP  metoCLOPramide (REGLAN) 5 MG tablet Take 1 tablet (5 mg total) by mouth every 8 (eight) hours as needed for nausea.  Patient not taking: Reported on 08/21/2022 07/02/22   Jon Billings, NP    Allergies:  No Known Allergies  ROS:  Unable to obtain secondary to patient baseline status    Objective:     BP (!) 166/89 (BP Location: Left Arm)   Pulse (!) 55   Temp 97.7 F (36.5 C)   Resp 17   Ht 5'  6" (1.676 m)   Wt 64.8 kg   SpO2 100%   BMI 23.06 kg/m   Constitutional :  alert and no distress  Lymphatics/Throat:  no asymmetry, masses, or scars        Gastrointestinal: soft, non-tender; bowel sounds normal; no masses,  no organomegaly.  Musculoskeletal: Steady gait and movement  Skin: Cool and moist, no surgical scars  Psychiatric: Normal affect, non-agitated, not confused       LABS:     Latest Ref Rng & Units 08/24/2022    2:39 AM 08/23/2022    3:42 AM 08/22/2022    4:50 PM  CMP  Glucose 70 - 99 mg/dL 66  55  85   BUN 8 - 23 mg/dL 21  27  31    Creatinine 0.61 - 1.24 mg/dL 2.00  2.19  2.35   Sodium 135 - 145 mmol/L 145  146  145   Potassium 3.5 - 5.1 mmol/L 4.0  4.0  4.0   Chloride 98 - 111 mmol/L 116  119  118   CO2 22 - 32 mmol/L 20  18  18    Calcium 8.9 - 10.3 mg/dL 8.7  8.5  8.3   Total Protein 6.5 - 8.1 g/dL 7.0  7.1    Total Bilirubin 0.3 - 1.2 mg/dL 0.7  0.6    Alkaline Phos 38 - 126 U/L 172  171    AST 15 - 41 U/L 86  84    ALT 0 - 44 U/L 102  96        Latest Ref Rng & Units 08/24/2022    2:39 AM 08/23/2022    3:42 AM 08/22/2022    4:50 PM  CBC  WBC 4.0 - 10.5 K/uL 4.7  3.8  3.5   Hemoglobin 13.0 - 17.0 g/dL 8.5  8.4  8.4   Hematocrit 39.0 - 52.0 % 27.5  27.0  27.3   Platelets 150 - 400 K/uL 120  136  132     RADS: N/a  Assessment:     Dysphagia secondary to stroke.  PEG requested for airway protection and prevention of aspiration PNA   Plan:     1. Alternatives include continued observation.  Benefits include maintaining nutrition.  Discussed the risk of surgery including post-op infxn, dysfunction, injury to surrounding organs and possible re-operation to address said risks. The risks of general anesthetic, if used, includes MI, CVA, sudden death or even reaction to anesthetic medications also discussed.   The patient's wife who is POA verbalized understanding and all questions were answered to the patient's satisfaction. She understands this  procedure does not change his overall mentation or ability to resume oral feeds.   To OR likely in two days for eliquis to wear off.  Will also confirm [place] available and pt wife able to proceed with tube feeds at home.  labs/images/medications/previous chart entries reviewed personally and relevant changes/updates noted above.

## 2022-08-24 NOTE — Patient Outreach (Signed)
  Care Coordination   Follow Up Visit Note   08/24/2022 Name: Evan Townsend. MRN: [id number] DOB: 1955/12/11  Evan Townsend. is a 67 y.o. year old male who sees Jon Billings, NP for primary care. I spoke with  Evan Townsend. by phone today.  What matters to the patients health and wellness today?  CAPS services    Goals Addressed             This Visit's Progress    Per spouse "I would like to be paid to care for my husband"       Care Coordination Interventions: Follow up phone call to patient's spouse who informed this social worker that patient has been hospitalized with pneumonia Discharge date pending-patient may be going home with Oxygen Confirmed that patient has not been contacted to schedule in home assessment for the CAPS program  Collaboration phone call to [facility] to follow up with CAPS referral, re-confirmed that the application has been received and is ready for nursing assessment to be scheduled-explained that multiple calls have been placed with no return call Left additional message with the scheduler for the CAPS assessment, email sent has well to scheduler by customer service Patient's spouse continues to discuss need for CAPS services and desire to provide care for patient-specifically now that he has been hospitalized          SDOH assessments and interventions completed:  No     Care Coordination Interventions:  Yes, provided   Follow up plan: Follow up call scheduled for 09/07/22    Encounter Outcome:  Pt. Visit Completed

## 2022-08-24 NOTE — Patient Instructions (Signed)
Visit Information  Thank you for taking time to visit with me today. Please don't hesitate to contact me if I can be of assistance to you.   Following are the goals we discussed today:   Goals Addressed             This Visit's Progress    Per spouse "I would like to be paid to care for my husband"       Care Coordination Interventions: Follow up phone call to patient's spouse who informed this social worker that patient has been hospitalized with pneumonia-he will likely need a feeding tube Per patient's spouse, she was contacted by [facility] and patient has been scheduled for an assessment for CAPS for 09/09/22 at 12:00pm to start services Spouse now asking if assessment can be moved up, patient's spouse provided with [facility] to see if they would be able to move the assessment to an earlier date [phone] Patient's spouse encouraged to call this social worker with any additional community resource needs-CSW to follow up in approximately 1 month            Please call the care guide team at [phone] if you need to cancel or reschedule your appointment.   If you are experiencing a Mental Health or [place] or need someone to talk to, please call 911   Patient verbalizes understanding of instructions and care plan provided today and agrees to view in [place]. Active MyChart status and patient understanding of how to access instructions and care plan via MyChart confirmed with patient.     No further follow up required: patient now being followed by the [place], [address] Worker  [facility] Care Management [phone]

## 2022-08-24 NOTE — TOC Initial Note (Signed)
Transition of Care (TOC) - Initial/Assessment Note    Patient Details  Name: Evan HESLIN Sr. MRN: [id number] Date of Birth: August 06, 1956  Transition of Care [facility] CM/SW Contact:    Colin Broach, LCSW Phone Number: 08/24/2022, 9:56 AM  Clinical Narrative:                 CSW completed readmit assessment with patient's wife, Evan Townsend who states that pt's PCP is [facility].  He is transported to his appointments by his wife, Evan Townsend [phone]). Wife states that pt receives his medications fro [facility] and that he doesn't have any concerns about paying for is meds.  Pt has the following DME at home:  walker, hospital bed, BSC and shower chair.  TOC will continue to follow for discharge planning.   Expected Discharge Plan:  (unknown at this time) Barriers to Discharge: Continued Medical Work up   Patient Goals and CMS Choice Patient states their goals for this hospitalization and ongoing recovery are:: to return home          Expected Discharge Plan and Services                                              Prior Living Arrangements/Services   Lives with:: Spouse                   Activities of Daily Living      Permission Sought/Granted                  Emotional Assessment Appearance:: Other (Comment Required (uable to assess) Attitude/Demeanor/Rapport: Unable to Assess Affect (typically observed): Unable to Assess   Alcohol / Substance Use: Not Applicable Psych Involvement: No (comment)  Admission diagnosis:  Aspiration pneumonia (HCC) [J69.0] AKI (acute kidney injury) (HCC) [N17.9] Septic shock (HCC) [A41.9, R65.21] Aspiration pneumonia of both lower lobes, unspecified aspiration pneumonia type (HCC) [J69.0] Patient Active Problem List   Diagnosis Date Noted   Aspiration pneumonia (HCC) 08/21/2022   Hypotension 08/21/2022   Acute kidney injury superimposed on chronic kidney disease (HCC) 06/23/2022    Hypoglycemia 06/22/2022   Stroke (HCC) 06/22/2022   Chronic diastolic CHF (congestive heart failure) (HCC) 06/22/2022   Iron deficiency anemia 06/22/2022   Chronic kidney disease, stage 3b (HCC) 06/22/2022   Acute metabolic encephalopathy 06/22/2022   Type II diabetes mellitus with renal manifestations (HCC) 06/22/2022   Acute ischemic stroke (HCC) 04/22/2022   Slurred speech    Weakness 04/21/2022   CKD stage 3 due to type 2 diabetes mellitus (HCC) 04/21/2022   Hypothermia 01/10/2022   Anemia due to stage 3b chronic kidney disease (HCC) 01/08/2022   Sinus bradycardia 01/07/2022   BPH (benign prostatic hyperplasia) 01/07/2022   Paroxysmal atrial fibrillation (HCC) 01/07/2022   Dyslipidemia 01/07/2022   Elevated liver enzymes 12/28/2021   Sinus pause    Bradycardia 10/16/2021   Hypertensive emergency without congestive heart failure 09/14/2021   Unstable angina (HCC)    Acute CVA (cerebrovascular accident) (HCC) 06/05/2021   Need for influenza vaccination 06/02/2021   Constipation 06/02/2021   Insomnia 06/02/2021   Gastroparesis 02/26/2021   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   Muscle spasm of left lower extremity 02/26/2021   History of stroke 02/26/2021   Diastolic dysfunction    Monitoring for anticoagulant use 06/27/2020   Lower extremity edema 05/16/2020  Atrial fibrillation, chronic ([place]) 10/26/2019   Coagulopathy ([place]) 06/12/2019   Gastroesophageal reflux disease    Hemiplegia and hemiparesis following cerebral infarction affecting left non-dominant side ([place]) 11/25/2018   RBBB 10/15/2018   Dysphagia, post-stroke    Microalbuminuria 07/29/2018   Hyperlipidemia LDL goal <70 02/11/2017   Recurrent strokes ([place]) 11/10/2016   Essential hypertension 11/10/2016   Uncontrolled type 2 diabetes mellitus with hyperglycemia, with long-term current use of insulin ([place]) 11/10/2016   Tobacco abuse 08/28/2013   Peripheral neuropathy 08/28/2013   PCP:  Jon Billings,  NP Pharmacy:   [facility], [place] [address] [place] [address] Phone: [phone] Fax: [phone]  [facility] [address], [place] - [place] [address] Phone: [phone] Fax: [phone]  [facility] [address]), [place] - [place]) [place] [address] Phone: [phone] Fax: [phone]     Social Determinants of Health ([place]) Social History: [place]: No Food Insecurity (07/27/2022)  Housing: Low Risk  (07/27/2022)  Transportation Needs: No Transportation Needs (07/27/2022)  Utilities: Not At Risk (07/27/2022)  Alcohol Screen: Low Risk  (03/08/2020)  Depression (PHQ2-9): High Risk (04/06/2022)  Financial Resource Strain: Low Risk  (04/30/2022)  Physical Activity: Unknown (11/25/2018)  Social Connections: Moderately Integrated (01/13/2018)  Stress: No Stress Concern Present (11/25/2018)  Tobacco Use: Medium Risk (07/27/2022)   SDOH Interventions:     Readmission Risk Interventions     No data to display

## 2022-08-25 ENCOUNTER — Ambulatory Visit: Payer: Medicare Other | Admitting: Urology

## 2022-08-25 ENCOUNTER — Encounter: Payer: Medicare Other | Admitting: Occupational Therapy

## 2022-08-25 ENCOUNTER — Ambulatory Visit: Payer: Medicare Other

## 2022-08-25 DIAGNOSIS — R1319 Other dysphagia: Secondary | ICD-10-CM

## 2022-08-25 DIAGNOSIS — A419 Sepsis, unspecified organism: Secondary | ICD-10-CM

## 2022-08-25 DIAGNOSIS — R1310 Dysphagia, unspecified: Secondary | ICD-10-CM

## 2022-08-25 DIAGNOSIS — R6521 Severe sepsis with septic shock: Secondary | ICD-10-CM

## 2022-08-25 DIAGNOSIS — N179 Acute kidney failure, unspecified: Secondary | ICD-10-CM

## 2022-08-25 DIAGNOSIS — Z7189 Other specified counseling: Secondary | ICD-10-CM | POA: Diagnosis not present

## 2022-08-25 DIAGNOSIS — J690 Pneumonitis due to inhalation of food and vomit: Secondary | ICD-10-CM

## 2022-08-25 LAB — BASIC METABOLIC PANEL
Anion gap: 7 (ref 5–15)
BUN: 17 mg/dL (ref 8–23)
CO2: 22 mmol/L (ref 22–32)
Calcium: 9 mg/dL (ref 8.9–10.3)
Chloride: 115 mmol/L — ABNORMAL HIGH (ref 98–111)
Creatinine, Ser: 1.96 mg/dL — ABNORMAL HIGH (ref 0.61–1.24)
GFR, Estimated: 37 mL/min — ABNORMAL LOW (ref >60)
Glucose, Bld: 93 mg/dL (ref 70–99)
Potassium: 4 mmol/L (ref 3.5–5.1)
Sodium: 144 mmol/L (ref 135–145)

## 2022-08-25 LAB — CBC
HCT: 28.2 % — ABNORMAL LOW (ref 39.0–52.0)
Hemoglobin: 8.9 g/dL — ABNORMAL LOW (ref 13.0–17.0)
MCH: 27.1 pg (ref 26.0–34.0)
MCHC: 31.6 g/dL (ref 30.0–36.0)
MCV: 86 fL (ref 80.0–100.0)
Platelets: 145 10*3/uL — ABNORMAL LOW (ref 150–400)
RBC: 3.28 MIL/uL — ABNORMAL LOW (ref 4.22–5.81)
RDW: 18.9 % — ABNORMAL HIGH (ref 11.5–15.5)
WBC: 5.2 10*3/uL (ref 4.0–10.5)
nRBC: 0.6 % — ABNORMAL HIGH (ref 0.0–0.2)

## 2022-08-25 LAB — GLUCOSE, CAPILLARY
Glucose-Capillary: 110 mg/dL — ABNORMAL HIGH (ref 70–99)
Glucose-Capillary: 90 mg/dL (ref 70–99)
Glucose-Capillary: 129 mg/dL — ABNORMAL HIGH (ref 70–99)
Glucose-Capillary: 123 mg/dL — ABNORMAL HIGH (ref 70–99)

## 2022-08-25 MED ORDER — HYDRALAZINE HCL 20 MG/ML IJ SOLN
10.0000 mg | Freq: Three times a day (TID) | INTRAMUSCULAR | Status: DC
  Administered 2022-08-25 – 2022-08-26 (×3): 10 mg via INTRAVENOUS
  Filled 2022-08-25 (×3): qty 1

## 2022-08-25 MED ORDER — ORAL CARE MOUTH RINSE: 15.0000 mL | OROMUCOSAL | Status: DC | PRN

## 2022-08-25 MED ORDER — ORAL CARE MOUTH RINSE
15.0000 mL | OROMUCOSAL | Status: DC
  Administered 2022-08-25 – 2022-08-31 (×18): 15 mL via OROMUCOSAL

## 2022-08-25 NOTE — Progress Notes (Signed)
. Progress Note   Patient: Evan Townsend ZOX:[id number] DOB: [birth year] DOA: 08/21/2022     3 DOS: the patient was seen and examined on 08/25/2022   Brief hospital course:  Evan HAUSER Sr. is a 67 y.o. male with medical history significant of recurrent strokes with residual left-sided deficits, known aspiration, HFpEF, CKD stage III, type 2 diabetes, bradycardia who presents to the ED due to generalized weakness. History obtained from patient and his wife as patient has dysarthria at baseline.   Evan Townsend states that patient has been experiencing decreased appetite and generalized weakness over the last several days.  Overnight, she noticed that he was having increased difficulty with breathing in addition to a cough that sounded wet.  She was attempting to help him cough up sputum but feels that his cough is too weak.  She states that SLP has been coming to their home for therapy, but she is still worried that he is silently aspirating.  At this time, Evan Townsend denies any shortness of breath, chest pain, palpitations.  They deny any known fevers, chills, nausea, vomiting, diarrhea, abdominal pain.   ED course: On arrival to the ED, patient was hypotensive at 85/54 with heart rate of 41.  He was afebrile at 98.1.  He was saturating at 99% on room air.  Initial workup remarkable for WBC of 3.2, hemoglobin of 9.2 and platelets of 143.  CMP with bicarb of 20, BUN of 36, creatinine 2.53, alkaline phosphatase of 172, ALT of 68 and GFR of 27.  Lactic acid within normal limits x 2.  INR elevated at 1.6. Chest x-ray was obtained that  COVID-19 PCR, influenza PCR and RSV PCR negative. CXR showed left greater than right bibasilar infiltrates worrisome for pneumonia or aspiration.  TRH contacted for admission for aspiration pneumonia and AKI.  1/8- SLP evaluation MBS determined at extreme aspiration risk and unreversible cause. Recommended continue NPO. Wife decided to move forward with PEG placement.  General surgery consulted and eliquis held.   1/9- stable. Remains on IV d5 and NPO  Assessment and Plan:  Aspiration pneumonia :  Patient presenting with several days of decreased appetite and weakness in the setting of known dysphagia with aspiration in the past. Swallow study today confirms worsening dysphagia since previous studies. SLP recommends continuing NPO indefinitely. Discussed palliative feeds vs enteral feeds with wife. Included that risk of aspiration remains even with PEG due to even being able to aspirate saliva. She would like to proceed with PEG placement. She was agreeable to discussions with palliative.  - general surgery consulted, appreciate your care  - holding eliquis  - planning PEG placement tentatively 1/10 - palliative consulted.  - c/w  Unasyn 3 g every 6 hours, Mucinex for cough/sputum. - SLP evaluation - continue NPO  Hypoglycemia- due to NPO status - discontinued insulin - continue to monitor CBGs - dextrose IV - electrolyte monitoring frequently  Acute kidney injury superimposed on chronic kidney disease :improving with 2.53 > 2.35> 2.19>2.0>1.96 with improvement in BUN.  - continue IV fluids as above. Cannot meet his hydration needs by PO - Trend BMP - Monitor urine output - Avoid nephrotoxic agents  Hypertension- poorly controlled as unable to take his home PO medications.  Started on IV hydralazine scheduled.  - continue IV hydralazine and increase PRN - add back on home antihypertensives as tolerated.   Recurrent strokes (HCC) No focal deficits on examination today that are new, so low suspicion for new CVA. -  Continue home Eliquis  Uncontrolled type 2 diabetes mellitus with hyperglycemia, with long-term current use of insulin ([place]) - Hold home Farxiga  Sinus bradycardia Per chart review, patient has a history of second-degree AV block with negative workup previously.   Asymptomatic from his bradycardia. -Continue outpatient follow-up  with cardiology.  Subjective:  Patient seen and examined this morning.  No overnight events.  Denies pain, discomfort.   Physical Exam: Vitals:   08/24/22 1750 08/24/22 2036 08/25/22 0009 08/25/22 0449  BP: (!) 171/88 (!) 168/87 (!) 153/83 (!) 163/81  Pulse: 61 71 75 73  Resp: 18 18 18 18   Temp: (!) 97.4 F (36.3 C) (!) 97.4 F (36.3 C) 97.7 F (36.5 C) 97.8 F (36.6 C)  TempSrc: Oral  Oral Oral  SpO2: 100% 100% 100% 100%  Weight:      Height:       No acute distress.  Head/ENT normal cephalic atraumatic.  Pupil equal reactive light commendation.  S1-S2 regular with no grade 3 murmur. -Equal air entry bilaterally with basal Rales pronounced on the right side. -Abdomen nontender nondistended. -Extremity with no edema. -Patient slow alert and oriented to time place and person.  Left upper and lower extremity strength 2/5 right upper and lower extremity strength 5/5 .facial symmetry preserved.  Data Reviewed:  CBC    Component Value Date/Time   WBC 5.2 08/25/2022 0454   RBC 3.28 (L) 08/25/2022 0454   HGB 8.9 (L) 08/25/2022 0454   HGB 9.7 (L) 06/09/2022 1625   HCT 28.2 (L) 08/25/2022 0454   HCT 29.6 (L) 06/09/2022 1625   PLT 145 (L) 08/25/2022 0454   PLT 230 06/09/2022 1625   MCV 86.0 08/25/2022 0454   MCV 83 06/09/2022 1625   MCV 88 03/15/2013 1649   MCH 27.1 08/25/2022 0454   MCHC 31.6 08/25/2022 0454   RDW 18.9 (H) 08/25/2022 0454   RDW 18.4 (H) 06/09/2022 1625   RDW 14.5 03/15/2013 1649   LYMPHSABS 0.5 (L) 08/21/2022 1324   LYMPHSABS 1.7 06/09/2022 1625   LYMPHSABS 2.7 03/15/2013 1649   MONOABS 0.2 08/21/2022 1324   MONOABS 0.5 03/15/2013 1649   EOSABS 0.0 08/21/2022 1324   EOSABS 0.1 06/09/2022 1625   EOSABS 0.1 03/15/2013 1649   BASOSABS 0.0 08/21/2022 1324   BASOSABS 0.0 06/09/2022 1625   BASOSABS 0.1 03/15/2013 1649      Latest Ref Rng & Units 08/25/2022    4:54 AM 08/24/2022    2:39 AM 08/23/2022    3:42 AM  BMP  Glucose 70 - 99 mg/dL 93  66  55   BUN  8 - 23 mg/dL 17  21  27    Creatinine 0.61 - 1.24 mg/dL 1.96  2.00  2.19   Sodium 135 - 145 mmol/L 144  145  146   Potassium 3.5 - 5.1 mmol/L 4.0  4.0  4.0   Chloride 98 - 111 mmol/L 115  116  119   CO2 22 - 32 mmol/L 22  20  18    Calcium 8.9 - 10.3 mg/dL 9.0  8.7  8.5     Family Communication: Wife on phone  Disposition: Status is: Inpatient Remains inpatient appropriate because: worsening dysphagia requiring PEG   Planned Discharge Destination: Home  Time spent: 35 minutes  Author: Richarda Osmond, MD 08/25/2022 7:03 AM  For on call review [email or web address].

## 2022-08-25 NOTE — Consult Note (Signed)
Consultation Note Date: 08/25/2022   Patient Name: Evan Townsend  DOB: September 15, 1955  MRN: [id number]  Age / Sex: 67 y.o., male  PCP: Larae Grooms, NP Referring Physician: Leeroy Bock, MD  Reason for Consultation: Establishing goals of care  HPI/Patient Profile: Evan DOANE Sr. is a 67 y.o. male with medical history significant of recurrent strokes with residual left-sided deficits, known aspiration, HFpEF, CKD stage III, type 2 diabetes, bradycardia who presents to the ED due to generalized weakness. History obtained from patient and his wife as patient has dysarthria at baseline.   Evan Townsend states that patient has been experiencing decreased appetite and generalized weakness over the last several days.  Overnight, she noticed that he was having increased difficulty with breathing in addition to a cough that sounded wet.  She was attempting to help him cough up sputum but feels that his cough is too weak.  She states that SLP has been coming to their home for therapy, but she is still worried that he is silently aspirating.   Clinical Assessment and Goals of Care: Notes and labs reviewed. In to see patient. He is resting in bed with eyes closed. Wife is at bedside. She states they have been married 48 years and have 5 children. Wife states she is a home health aid herself.   She discusses her husband's recurrent strokes. She states at baseline prior to this admission, patient was able to walk with a cane. She states he had speech difficulties. She discusses that SLP has been coming to the house to work with him.  We discussed his diagnosis, prognosis, GOC, EOL wishes disposition and options.  Created space and opportunity for patient  to explore thoughts and feelings regarding current medical information.   A detailed discussion was had today regarding advanced directives.  Concepts  specific to code status, artifical feeding and hydration, IV antibiotics and rehospitalization were discussed.  The difference between an aggressive medical intervention path and a comfort care path was discussed.  Values and goals of care important to patient and family were attempted to be elicited.  Discussed limitations of medical interventions to prolong quality of life in some situations and discussed the concept of human mortality.  Wife states God is in control of all things. She discusses that she recently got insurance approval to be her husband's home health aid, where she can be paid to provide his care. We discussed any boundaries on care or QOL. She states they have a nephew who she states "just lays in bed staring at the ceiling". She states she helps his wife care for him. She states "God could remove him from the earth at any time, but he is still here".    She states she and her husband are aware he will not be able to safely have anything by mouth, and will need all nutrition by PEG tube. She states he will still be alive and this is most important. She states he would want any and all care  available to keep him alive. She states God will take him when he is ready to do so.     SUMMARY OF RECOMMENDATIONS   Full code/ full scope.       Primary Diagnoses: Present on Admission:  Aspiration pneumonia ([employer])  Sinus bradycardia  Recurrent strokes (HCC)  Acute kidney injury superimposed on chronic kidney disease ([place])   I have reviewed the medical record, interviewed the patient and family, and examined the patient. The following aspects are pertinent.  Past Medical History:  Diagnosis Date   Acute CVA (cerebrovascular accident) ([place]) 06/05/2021   Acute ischemic stroke ([place]) 04/22/2022   CAP (community acquired pneumonia) 11/26/2021   Carotid arterial disease ([place])    a. 08/2018 Carotid U/S: min-mod RICA atherosclerosis w/o hemodynamically significant stenosis. Nl LICA.    Diabetes 1.5, managed as type 2 ([place])    Diastolic dysfunction    a. 08/2018 Echo: EF 65%. No rwma. Gr1 DD. Mild MR.   Diastolic dysfunction    a. 08/2019 Echo: EF 55-60%, Gr1 DD. No rwma. Mild MR. RVSP 37.51mmHg.   Hypercholesterolemia    Hypertension    PAF (paroxysmal atrial fibrillation) ([place])    a. 10/2019 Event Monitor: PAF; b. CHA2DS2VASc = 5-->Eliquis.   Poorly controlled diabetes mellitus ([place])    a. 04/2019 A1c 13.8.   Recurrent strokes ([place])    a. 10/2016 MRI/A: Acute 21mm R thalamic infarct, ? subacute infarct of R corona radiata; b. 08/2017 MRI/A: Acute 43mm lateral L thalamic infarct. Other more remote lacunar infarcts of thalami bilat. Small vessel dzs; c. 08/2018 MRI/A: Acute lacunar infarct of the post limb of R internal capsule; d. 08/2019 MRI Acute CVA of L paramedian pons adn R cerebellar hemisphere.   Sepsis due to pneumonia ([place]) 11/26/2021   Tobacco abuse    Social History   Socioeconomic History   Marital status: Married    Spouse name: Presenter, broadcasting   Number of children: 5   Years of education: Not on file   Highest education level: Not on file  Occupational History   Occupation: unemployed  Tobacco Use   Smoking status: Former    Packs/day: 1.00    Years: 38.00    Total pack years: 38.00    Types: Cigarettes    Start date: 1982   Smokeless tobacco: Never  Vaping Use   Vaping Use: Never used  Substance and Sexual Activity   Alcohol use: Not Currently    Alcohol/week: 1.0 standard drink of alcohol    Types: 1 Cans of beer per week    Comment: previously drank but nothing in 1-2 yrs (08/2019).   Drug use: Not Currently    Types: Cocaine, Marijuana    Comment: prev used cocaine/marijuana but none x 1-2 yrs (08/2019).   Sexual activity: Yes  Other Topics Concern   Not on file  Social History Narrative   Lives in [place] with his wife.  Uses a cane when ambulating.  Can walk about 25 yds prior to having to rest - says he stumbles if he has to work further  than that.  Usually uses a scooter to get around stores.   Social Determinants of Health   Financial Resource Strain: Low Risk  (04/30/2022)   Overall Financial Resource Strain (CARDIA)    Difficulty of Paying Living Expenses: Not very hard  Food Insecurity: No Food Insecurity (07/27/2022)   Hunger Vital Sign    Worried About Running Out of Food in the Last Year: Never true  Ran Out of Food in the Last Year: Never true  Transportation Needs: No Transportation Needs (07/27/2022)   PRAPARE - Hydrologist (Medical): No    Lack of Transportation (Non-Medical): No  Physical Activity: Unknown (11/25/2018)   Exercise Vital Sign    Days of Exercise per Week: 0 days    Minutes of Exercise per Session: Not asked  Stress: No Stress Concern Present (11/25/2018)   [place]    Feeling of Stress : Only a little  Social Connections: Moderately Integrated (01/13/2018)   Social Connection and Isolation Panel [NHANES]    Frequency of Communication with Friends and Family: More than three times a week    Frequency of Social Gatherings with Friends and Family: Once a week    Attends Religious Services: More than 4 times per year    Active Member of [employer] or Organizations: No    Attends Music therapist: Never    Marital Status: Married   Family History  Problem Relation Age of Onset   Hypertension Mother    Stroke Mother        died @ age 40   Hypertension Father    Diabetes Father    Heart attack Father        died @ 24   Scheduled Meds:  atorvastatin  80 mg Oral QPM   guaiFENesin  1,200 mg Oral BID   hydrALAZINE  10 mg Intravenous Q8H   lidocaine  1 patch Transdermal Q24H   mirtazapine  7.5 mg Oral Daily   nystatin  1 mL Oral Q6H   pantoprazole (PROTONIX) IV  40 mg Intravenous Q24H   sodium chloride flush  3 mL Intravenous Q12H   tamsulosin  0.4 mg Oral Daily   Continuous  Infusions:  ampicillin-sulbactam (UNASYN) IV 3 g (08/25/22 1350)   dextrose 5% lactated ringers 100 mL/hr at 08/25/22 1248   PRN Meds:.acetaminophen **OR** acetaminophen, albuterol, ondansetron **OR** ondansetron (ZOFRAN) IV Medications Prior to Admission:  Prior to Admission medications   Medication Sig Start Date End Date Taking? Authorizing Provider  albuterol (VENTOLIN HFA) 108 (90 Base) MCG/ACT inhaler Inhale 2 puffs into the lungs every 4 (four) hours as needed for wheezing or shortness of breath. 07/17/22  Yes Jon Billings, NP  amLODipine (NORVASC) 10 MG tablet TAKE 1 TABLET BY MOUTH DAILY 06/11/22  Yes Jon Billings, NP  atorvastatin (LIPITOR) 80 MG tablet TAKE 1 TABLET BY MOUTH EVERY EVENING 06/11/22  Yes Jon Billings, NP  dapagliflozin propanediol (FARXIGA) 10 MG TABS tablet Take 1 tablet (10 mg total) by mouth daily before breakfast. 07/02/22  Yes Jon Billings, NP  ELIQUIS 5 MG TABS tablet TAKE 1 TABLET BY MOUTH TWICE A DAY 06/11/22  Yes Jon Billings, NP  losartan (COZAAR) 25 MG tablet TAKE 1 TABLET BY MOUTH DAILY 08/05/22  Yes Jon Billings, NP  melatonin 1 MG TABS tablet Take 3 tablets (3 mg total) by mouth at bedtime as needed. Patient taking differently: Take 1 mg by mouth at bedtime. 04/23/22  Yes Nicole Kindred A, DO  mirtazapine (REMERON) 7.5 MG tablet TAKE ONE TABLET BY MOUTH AT BEDTIME 08/05/22  Yes Jon Billings, NP  nitroGLYCERIN (NITROSTAT) 0.4 MG SL tablet Place 1 tablet (0.4 mg total) under the tongue every 5 (five) minutes as needed for chest pain. 07/02/22  Yes Jon Billings, NP  omeprazole (PRILOSEC) 40 MG capsule TAKE 1 CAPSULE BY MOUTH IN THE MORNING AND  AT BEDTIME 06/11/22  Yes Jon Billings, NP  pregabalin (LYRICA) 50 MG capsule Take 50 mg by mouth 2 (two) times daily.   Yes [provider]  tamsulosin (FLOMAX) 0.4 MG CAPS capsule Take 1 capsule (0.4 mg total) by mouth daily. 02/25/22  Yes Billey Co, MD  iron  polysaccharides (NIFEREX) 150 MG capsule Take 1 capsule (150 mg total) by mouth daily. Patient not taking: Reported on 08/21/2022 06/10/22   Jon Billings, NP  metoCLOPramide (REGLAN) 5 MG tablet Take 1 tablet (5 mg total) by mouth every 8 (eight) hours as needed for nausea. Patient not taking: Reported on 08/21/2022 07/02/22   Jon Billings, NP   No Known Allergies Review of Systems  Unable to perform ROS   Physical Exam Constitutional:      Comments: Eyes closed.   Pulmonary:     Effort: Pulmonary effort is normal.     Vital Signs: BP (!) 170/84   Pulse 72   Temp 97.9 F (36.6 C) (Oral)   Resp 18   Ht 5\' 6"  (1.676 m)   Wt 64.8 kg   SpO2 100%   BMI 23.06 kg/m  Pain Scale: 0-10   Pain Score: 0-No pain   SpO2: SpO2: 100 % O2 Device:SpO2: 100 % O2 Flow Rate: .   IO: Intake/output summary:  Intake/Output Summary (Last 24 hours) at 08/25/2022 1454 Last data filed at 08/25/2022 [id number] Gross per 24 hour  Intake 2233.82 ml  Output 4600 ml  Net -2366.18 ml    LBM: Last BM Date : 08/25/22 Baseline Weight: Weight: 64.8 kg Most recent weight: Weight: 64.8 kg      Signed by: Asencion Gowda, NP   Please contact Palliative Medicine Team phone at [phone] for questions and concerns.  For individual provider: See Shea Evans

## 2022-08-25 NOTE — Care Management Important Message (Signed)
Important Message  Patient Details  Name: Evan SAPPENFIELD Sr. MRN: [id number] Date of Birth: 11-02-1955   Medicare Important Message Given:  N/A - LOS <3 / Initial given by admissions     Juliann Pulse A Ashana Tullo 08/25/2022, 8:54 AM

## 2022-08-25 NOTE — Progress Notes (Signed)
Initial Nutrition Assessment  DOCUMENTATION CODES:   Severe malnutrition in context of chronic illness  INTERVENTION:   -Once g-tube is placed:  Initiate Osmolite 1.5 @ 20 ml/hr and increase by 10 ml every 12 hours to goal rate of 55 ml/hr.   60 ml Prosource TF daily  150 ml free water flush every 4 hours    Tube feeding regimen provides 40981 kcal (100% of needs), 103 grams of protein, and 1006 ml of H2O.  Total free water: 1906 ml daily  -Monitor Mg, K, and Phos daily and replete as needed secondary to high refeeding risk  NUTRITION DIAGNOSIS:   Severe Malnutrition related to chronic illness (CVA) as evidenced by percent weight loss, moderate fat depletion, severe fat depletion, moderate muscle depletion, severe muscle depletion.  GOAL:   Patient will meet greater than or equal to 90% of their needs  MONITOR:   TF tolerance  REASON FOR ASSESSMENT:   Low Br[place]    ASSESSMENT:   Pt with medical history significant of recurrent strokes with residual left-sided deficits, known aspiration, HFpEF, CKD stage III, type 2 diabetes, bradycardia who presents due to generalized weakness.  Pt admitted with aspiration pneumonia.   1/8- s/p MBSS- recommend NPO  Reviewed I/O's: -3.9 L x 24 hours and _4.3L since admission  UOP: 4.4 L x 24 hours   Per SLP notes, pt with at severe aspiration risk. Plan for PEG tube placement tentatively on 08/26/22.   Spoke with pt at bedside, who reports feeling better today. Pt reports he had a good appetite PTA and had no difficulty chewing or swallowing. Per chart review, pt with history of CVA and there have been concerns that pt had been silently aspirating.   Pt denied any weight loss. Reviewed wt hx;pt has experienced a 9.7% wt loss over the past 3 months, which is significant for time frame.   Palliative care following; plan for full scope care.   Per RN notes, pt refused ordered NG tube placement today. MD and pt wife aware.    Medications reviewed and include remeron and dextrose 5% in lactated ringers infusion @ 100 ml/hr.   Labs reviewed: CBGS: 90-129.    NUTRITION - FOCUSED PHYSICAL EXAM:  Flowsheet Row Most Recent Value  Orbital Region Moderate depletion  Upper Arm Region Severe depletion  Thoracic and Lumbar Region Moderate depletion  Buccal Region Severe depletion  Temple Region Severe depletion  Clavicle Bone Region Severe depletion  Clavicle and Acromion Bone Region Moderate depletion  Scapular Bone Region Moderate depletion  Dorsal Hand Moderate depletion  Patellar Region Severe depletion  Anterior Thigh Region Severe depletion  Posterior Calf Region Severe depletion  Edema (RD Assessment) None  Hair Reviewed  Eyes Reviewed  Mouth Reviewed  Skin Reviewed  Nails Reviewed       Diet Order:   Diet Order             Diet NPO time specified  Diet effective now                   EDUCATION NEEDS:   No education needs have been identified at this time  Skin:  Skin Assessment: Reviewed RN Assessment  Last BM:  08/25/22 (type 6)  Height:   Ht Readings from Last 1 Encounters:  08/21/22 5\' 6"  (1.676 m)    Weight:   Wt Readings from Last 1 Encounters:  08/21/22 64.8 kg    Ideal Body Weight:  64.5 kg  BMI:  Body  mass index is 23.06 kg/m.  Estimated Nutritional Needs:   Kcal:  1900-2100  Protein:  95-110 grams  Fluid:  > 1.9 L    Loistine Chance, RD, LDN, [place] Registered Dietitian II Certified Diabetes Care and Education Specialist Please refer to [facility] for RD and/or RD on-call/weekend/after hours pager

## 2022-08-25 NOTE — Progress Notes (Addendum)
Pt refusing NG tube. Request to speak with MD. MD C. Anderson made aware 1729. Awaiting response.   43 MD acknowledge refusal. States she will speak with pt wife.

## 2022-08-26 ENCOUNTER — Ambulatory Visit: Payer: Medicare Other | Admitting: Urology

## 2022-08-26 ENCOUNTER — Inpatient Hospital Stay: Payer: 59 | Admitting: Certified Registered"

## 2022-08-26 ENCOUNTER — Encounter: Payer: Medicare Other | Admitting: Occupational Therapy

## 2022-08-26 ENCOUNTER — Encounter: Payer: Self-pay | Admitting: Internal Medicine

## 2022-08-26 ENCOUNTER — Ambulatory Visit: Payer: Medicare Other

## 2022-08-26 ENCOUNTER — Encounter: Payer: Medicare Other | Admitting: Speech Pathology

## 2022-08-26 ENCOUNTER — Encounter
Admission: EM | Disposition: A | Discharge status: Home Health | Payer: Self-pay | Source: Home / Self Care | Attending: Student in an Organized Health Care Education/Training Program

## 2022-08-26 DIAGNOSIS — J690 Pneumonitis due to inhalation of food and vomit: Secondary | ICD-10-CM | POA: Diagnosis not present

## 2022-08-26 DIAGNOSIS — R1319 Other dysphagia: Secondary | ICD-10-CM | POA: Diagnosis not present

## 2022-08-26 DIAGNOSIS — E43 Unspecified severe protein-calorie malnutrition: Secondary | ICD-10-CM | POA: Insufficient documentation

## 2022-08-26 DIAGNOSIS — Z7189 Other specified counseling: Secondary | ICD-10-CM | POA: Diagnosis not present

## 2022-08-26 DIAGNOSIS — N179 Acute kidney failure, unspecified: Secondary | ICD-10-CM | POA: Diagnosis not present

## 2022-08-26 DIAGNOSIS — A419 Sepsis, unspecified organism: Secondary | ICD-10-CM | POA: Diagnosis not present

## 2022-08-26 HISTORY — PX: ESOPHAGOGASTRODUODENOSCOPY: SHX5428

## 2022-08-26 HISTORY — PX: PEG PLACEMENT: SHX5437

## 2022-08-26 LAB — MAGNESIUM
Magnesium: 1.7 mg/dL (ref 1.7–2.4)
Magnesium: 1.8 mg/dL (ref 1.7–2.4)

## 2022-08-26 LAB — BASIC METABOLIC PANEL
Anion gap: 7 (ref 5–15)
BUN: 14 mg/dL (ref 8–23)
CO2: 22 mmol/L (ref 22–32)
Calcium: 8.9 mg/dL (ref 8.9–10.3)
Chloride: 117 mmol/L — ABNORMAL HIGH (ref 98–111)
Creatinine, Ser: 1.93 mg/dL — ABNORMAL HIGH (ref 0.61–1.24)
GFR, Estimated: 38 mL/min — ABNORMAL LOW (ref >60)
Glucose, Bld: 130 mg/dL — ABNORMAL HIGH (ref 70–99)
Potassium: 4 mmol/L (ref 3.5–5.1)
Sodium: 146 mmol/L — ABNORMAL HIGH (ref 135–145)

## 2022-08-26 LAB — GLUCOSE, CAPILLARY
Glucose-Capillary: 159 mg/dL — ABNORMAL HIGH (ref 70–99)
Glucose-Capillary: 123 mg/dL — ABNORMAL HIGH (ref 70–99)
Glucose-Capillary: 79 mg/dL (ref 70–99)
Glucose-Capillary: 94 mg/dL (ref 70–99)
Glucose-Capillary: 81 mg/dL (ref 70–99)

## 2022-08-26 LAB — CULTURE, BLOOD (ROUTINE X 2)
Culture: NO GROWTH
Culture: NO GROWTH
Special Requests: ADEQUATE

## 2022-08-26 LAB — PHOSPHORUS
Phosphorus: 3.7 mg/dL (ref 2.5–4.6)
Phosphorus: 3.9 mg/dL (ref 2.5–4.6)

## 2022-08-26 SURGERY — EGD (ESOPHAGOGASTRODUODENOSCOPY)
Anesthesia: General

## 2022-08-26 MED ORDER — OSMOLITE 1.5 CAL PO LIQD
1000.0000 mL | ORAL | Status: DC
  Administered 2022-08-28: 1000 mL

## 2022-08-26 MED ORDER — EPHEDRINE 5 MG/ML INJ
INTRAVENOUS | Status: AC
  Filled 2022-08-26: qty 5

## 2022-08-26 MED ORDER — VITAL HIGH PROTEIN PO LIQD: 1000.0000 mL | ORAL | Status: DC

## 2022-08-26 MED ORDER — PHENYLEPHRINE 80 MCG/ML (10ML) SYRINGE FOR IV PUSH (FOR BLOOD PRESSURE SUPPORT)
PREFILLED_SYRINGE | INTRAVENOUS | Status: AC
  Filled 2022-08-26: qty 20

## 2022-08-26 MED ORDER — SODIUM CHLORIDE 0.9 % IV SOLN: INTRAVENOUS | Status: DC

## 2022-08-26 MED ORDER — MORPHINE SULFATE (PF) 2 MG/ML IV SOLN
1.0000 mg | Freq: Once | INTRAVENOUS | Status: AC
  Administered 2022-08-26: 1 mg via INTRAVENOUS
  Filled 2022-08-26: qty 1

## 2022-08-26 MED ORDER — PROSOURCE TF20 ENFIT COMPATIBL EN LIQD
60.0000 mL | Freq: Every day | ENTERAL | Status: DC
  Administered 2022-08-27 – 2022-08-28 (×2): 60 mL
  Filled 2022-08-26 (×3): qty 60

## 2022-08-26 MED ORDER — LACTATED RINGERS IV SOLN: INTRAVENOUS | Status: DC

## 2022-08-26 MED ORDER — DEXMEDETOMIDINE HCL IN NACL 80 MCG/20ML IV SOLN
INTRAVENOUS | Status: DC | PRN
  Administered 2022-08-26: 8 ug via BUCCAL

## 2022-08-26 MED ORDER — FREE WATER
150.0000 mL | Status: DC
  Administered 2022-08-27 – 2022-08-28 (×7): 150 mL

## 2022-08-26 MED ORDER — PROPOFOL 500 MG/50ML IV EMUL
INTRAVENOUS | Status: DC | PRN
  Administered 2022-08-26: 75 ug/kg/min via INTRAVENOUS

## 2022-08-26 MED ORDER — LIDOCAINE HCL (PF) 2 % IJ SOLN
INTRAMUSCULAR | Status: AC
  Filled 2022-08-26: qty 5

## 2022-08-26 MED ORDER — LIDOCAINE HCL (CARDIAC) PF 100 MG/5ML IV SOSY
PREFILLED_SYRINGE | INTRAVENOUS | Status: DC | PRN
  Administered 2022-08-26 (×2): 30 mg via INTRAVENOUS

## 2022-08-26 MED ORDER — GLYCOPYRROLATE 0.2 MG/ML IJ SOLN
INTRAMUSCULAR | Status: AC
  Filled 2022-08-26: qty 1

## 2022-08-26 MED ORDER — HYDRALAZINE HCL 20 MG/ML IJ SOLN
20.0000 mg | Freq: Three times a day (TID) | INTRAMUSCULAR | Status: DC
  Administered 2022-08-26 – 2022-08-28 (×4): 20 mg via INTRAVENOUS
  Filled 2022-08-26 (×4): qty 1

## 2022-08-26 MED ORDER — PROPOFOL 1000 MG/100ML IV EMUL
INTRAVENOUS | Status: AC
  Filled 2022-08-26: qty 100

## 2022-08-26 MED ORDER — PROPOFOL 10 MG/ML IV BOLUS
INTRAVENOUS | Status: DC | PRN
  Administered 2022-08-26 (×2): 40 mg via INTRAVENOUS
  Administered 2022-08-26: 20 mg via INTRAVENOUS

## 2022-08-26 NOTE — Op Note (Signed)
[facility] Gastroenterology Patient Name: Evan Townsend Procedure Date: 08/26/2022 2:44 PM MRN: [id number] Account #: [id number] Date of Birth: 06/13/1956 Admit Type: Inpatient Age: 67 Room: [facility] ENDO ROOM 4 Gender: Male Note Status: Finalized Instrument Name: Upper Endoscope [id number] Procedure:             Upper GI endoscopy Indications:           Place PEG due to impaired swallowing Providers:             Benjamine Sprague MD, MD Referring MD:          Carlynn Herald. Anderson (Referring MD) Medicines:             Propofol per Anesthesia Complications:         No immediate complications. Procedure:             Pre-Anesthesia Assessment:                        - After reviewing the risks and benefits, the patient                         was deemed in satisfactory condition to undergo the                         procedure in an ambulatory setting.                        After obtaining informed consent, the endoscope was                         passed under direct vision. Throughout the procedure,                         the patient's blood pressure, pulse, and oxygen                         saturations were monitored continuously. The Endoscope                         was introduced through the mouth, and advanced to the                         body of the stomach. The upper GI endoscopy was                         accomplished with ease. The patient tolerated the                         procedure well. Findings:      The gastric body was normal. The patient was placed in the supine       position for PEG placement. The stomach was insufflated to appose       gastric and abdominal walls. A site was located in the body of the       stomach with excellent transillumination for placement. The abdominal       wall was marked and prepped in a sterile manner. The area was       anesthetized with 4 mL of 0.5% lidocaine. The trocar needle was       introduced through  the  abdominal wall and into the stomach under direct       endoscopic view. A snare was introduced through the endoscope and opened       in the gastric lumen. The guide wire was passed through the trocar and       into the open snare. The snare was closed around the guide wire. The       endoscope and snare were removed, pulling the wire out through the       mouth. A skin incision was made at the site of needle insertion. The       externally removable 20 Fr button gastrostomy tube was lubricated. The       G-tube was tied to the guide wire and pulled through the mouth and into       the stomach. The trocar needle was removed, and the gastrostomy tube was       pulled out from the stomach through the skin. The external bumper was       attached to the gastrostomy tube, and the tube was cut to remove the       guide wire. The final position of the gastrostomy tube was confirmed by       relook endoscopy, and skin marking noted to be 4 cm at the external       bumper. The final tension and compression of the abdominal wall by the       PEG tube and external bumper were checked and revealed that the bumper       was loose and lightly touching the skin. The feeding tube was capped,       and the tube site cleaned and dressed. Impression:            - Normal gastric body.                        - An externally removable PEG placement was                         successfully completed.                        - No specimens collected. Recommendation:        - Please follow the post-PEG recommendations                         including: change dressing once per day, may use PEG                         tomorrow for feedings and clean site with soap and                         water daily and dry thoroughly. Procedure Code(s):     --- Professional ---                        [phone], 52, Esophagogastroduodenoscopy, flexible,                         transoral; with directed placement of percutaneous                          gastrostomy tube Diagnosis Code(s):     ---  Professional ---                        R13.10, Dysphagia, unspecified CPT copyright 2022 American Medical Association. All rights reserved. The codes documented in this report are preliminary and upon coder review may  be revised to meet current compliance requirements. Dr. Sheppard Penton, MD Benjamine Sprague MD, MD 08/26/2022 4:23:23 PM This report has been signed electronically. Number of Addenda: 0 Note Initiated On: 08/26/2022 2:44 PM Estimated Blood Loss:  Estimated blood loss was minimal.      [facility]

## 2022-08-26 NOTE — Anesthesia Postprocedure Evaluation (Signed)
Anesthesia Post Note  Patient: Evan ARNONE Sr.  Procedure(s) Performed: ESOPHAGOGASTRODUODENOSCOPY (EGD) PERCUTANEOUS ENDOSCOPIC GASTROSTOMY (PEG) PLACEMENT  Patient location during evaluation: Endoscopy Anesthesia Type: General Level of consciousness: awake and alert Pain management: pain level controlled Vital Signs Assessment: post-procedure vital signs reviewed and stable Respiratory status: spontaneous breathing, nonlabored ventilation, respiratory function stable and patient connected to nasal cannula oxygen Cardiovascular status: blood pressure returned to baseline and stable Postop Assessment: no apparent nausea or vomiting Anesthetic complications: no   No notable events documented.   Last Vitals:  Vitals:   08/26/22 1618 08/26/22 1628  BP: 137/69 111/63  Pulse: 65   Resp: 20 18  Temp: 36.8 C   SpO2: 100%     Last Pain:  Vitals:   08/26/22 1618  TempSrc: Temporal  PainSc: Asleep                 Arita Miss

## 2022-08-26 NOTE — Progress Notes (Signed)
Nutrition Follow-up  DOCUMENTATION CODES:   Severe malnutrition in context of chronic illness  INTERVENTION:   -Once NGT is placed:   Initiate Osmolite 1.5 @ 20 ml/hr and increase by 10 ml every 12 hours to goal rate of 55 ml/hr.    60 ml Prosource TF daily   150 ml free water flush every 4 hours     Tube feeding regimen provides 20260 kcal (100% of needs), 103 grams of protein, and 1006 ml of H2O.  Total free water: 1906 ml daily   -Monitor Mg, K, and Phos daily and replete as needed secondary to high refeeding risk  NUTRITION DIAGNOSIS:   Severe Malnutrition related to chronic illness (CVA) as evidenced by percent weight loss, moderate fat depletion, severe fat depletion, moderate muscle depletion, severe muscle depletion.  Ongoing  GOAL:   Patient will meet greater than or equal to 90% of their needs  Progressing   MONITOR:   TF tolerance  REASON FOR ASSESSMENT:   Low Braden    ASSESSMENT:   Pt with medical history significant of recurrent strokes with residual left-sided deficits, known aspiration, HFpEF, CKD stage III, type 2 diabetes, bradycardia who presents due to generalized weakness.  Reviewed I/O's: +756 ml x 24 hours and -3.6 L since admission  UOP: 1.9 L x 24 hours   Case discussed with RN and MD. Pt refused NGT placement last night. Per MD, discussed with pt's wife that she would like for NGT to be placed by MD, so NGT will be placed in IR today. Plan for g-tube placement once eliquis wears off.   Medications reviewed and include lactated ringers infusion @ 100 ml/hr.   Labs reviewed: Na: 146, CBGS: 123-159 (inpatient orders for glycemic control are none).    Diet Order:   Diet Order             Diet NPO time specified  Diet effective now                   EDUCATION NEEDS:   No education needs have been identified at this time  Skin:  Skin Assessment: Reviewed RN Assessment  Last BM:  08/25/22 (type 6)  Height:   Ht Readings  from Last 1 Encounters:  08/21/22 5\' 6"  (1.676 m)    Weight:   Wt Readings from Last 1 Encounters:  08/21/22 64.8 kg    Ideal Body Weight:  64.5 kg  BMI:  Body mass index is 23.06 kg/m.  Estimated Nutritional Needs:   Kcal:  1900-2100  Protein:  95-110 grams  Fluid:  > 1.9 L    Loistine Chance, RD, LDN, [place] Registered Dietitian II Certified Diabetes Care and Education Specialist Please refer to [facility] for RD and/or RD on-call/weekend/after hours pager

## 2022-08-26 NOTE — Anesthesia Preprocedure Evaluation (Addendum)
Anesthesia Evaluation  Patient identified by MRN, date of birth, ID band Patient awake  General Assessment Comment:  Patient very difficult to understand due to garbled speech 2/2 stroke. Per wife, he is able to understand. It appears he is able to tell me his name and birthday. I do not understand anything else he says, and need to defer to wife at bedside.  Per Palliative care note: "With further conversation, he states he does not believe he would want to be placed on a ventilator or life support, or have CPR if his heart or breathing stopped. " I brought this up with patient's wife, who said "But I say yes", meaning continue full code. She is the legal guardian. I encouraged her to have this conversation with husband.  Reviewed: Allergy & Precautions, NPO status , Patient's Chart, lab work & pertinent test results  History of Anesthesia Complications Negative for: history of anesthetic complications  Airway Mallampati: III  TM Distance: >3 FB Neck ROM: full    Dental  (+) Edentulous Lower, Edentulous Upper   Pulmonary shortness of breath and with exertion, neg sleep apnea, pneumonia, unresolved, neg COPD, Patient abstained from smoking.Not current smoker, former smoker Recent aspiration pneumonia, likely recurrent      rales    Cardiovascular Exercise Tolerance: Poor METShypertension, +CHF (DD)  (-) CAD and (-) Past MI Normal cardiovascular exam+ dysrhythmias (pAF and history of second-degree AV block with negative workup previously)  Rhythm:Regular Rate:Normal  TTE 04/2022: 1. Left ventricular ejection fraction, by estimation, is 55 to 60%. The  left ventricle has normal function. The left ventricle has no regional  wall motion abnormalities. There is mild left ventricular hypertrophy.  Left ventricular diastolic parameters  are consistent with Grade I diastolic dysfunction (impaired relaxation).   2. Right ventricular systolic  function is normal. The right ventricular  size is normal. There is normal pulmonary artery systolic pressure.   3. Left atrial size was mildly dilated.   4. A small pericardial effusion is present. The pericardial effusion is  anterior to the right ventricle.   5. The mitral valve is normal in structure. Mild mitral valve  regurgitation. No evidence of mitral stenosis.   6. The aortic valve is tricuspid. Aortic valve regurgitation is not  visualized. No aortic stenosis is present.   7. The inferior vena cava is normal in size with greater than 50%  respiratory variability, suggesting right atrial pressure of 3 mmHg.     Neuro/Psych  PSYCHIATRIC DISORDERS  Depression    residual left-sided deficits, known aspiration CVA (Recurrent strokes last in 2022), Residual Symptoms    GI/Hepatic Neg liver ROS,GERD  ,,  Endo/Other  diabetes, Type 2    Renal/GU CRFRenal disease  negative genitourinary   Musculoskeletal   Abdominal   Peds  Hematology negative hematology ROS (+)   Anesthesia Other Findings Past Medical History: Bradycardia  Severe malnutrition in context of chronic illness 06/05/2021: Acute CVA (cerebrovascular accident) ([place]) 04/22/2022: Acute ischemic stroke ([place]) 11/26/2021: CAP (community acquired pneumonia) No date: Carotid arterial disease ([place])     Comment:  a. 08/2018 Carotid U/S: min-mod RICA atherosclerosis w/o               hemodynamically significant stenosis. Nl LICA. No date: Diabetes 1.5, managed as type 2 ([place]) No date: Diastolic dysfunction     Comment:  a. 08/2018 Echo: EF 65%. No rwma. Gr1 DD. Mild MR. No date: Diastolic dysfunction     Comment:  a. 08/2019 Echo:  EF 55-60%, Gr1 DD. No rwma. Mild MR.               RVSP 37.54mmHg. No date: Hypercholesterolemia No date: Hypertension No date: PAF (paroxysmal atrial fibrillation) (HCC)     Comment:  a. 10/2019 Event Monitor: PAF; b. CHA2DS2VASc =               5-->Eliquis. No date: Poorly controlled  diabetes mellitus ([place])     Comment:  a. 04/2019 A1c 13.8. No date: Recurrent strokes ([place])     Comment:  a. 10/2016 MRI/A: Acute 79mm R thalamic infarct, ?               subacute infarct of R corona radiata; b. 08/2017 MRI/A:               Acute 76mm lateral L thalamic infarct. Other more remote               lacunar infarcts of thalami bilat. Small vessel dzs; c.               08/2018 MRI/A: Acute lacunar infarct of the post limb of R              internal capsule; d. 08/2019 MRI Acute CVA of L paramedian              pons adn R cerebellar hemisphere. 11/26/2021: Sepsis due to pneumonia ([place]) No date: Tobacco abuse  Past Surgical History: 04/26/2019: ESOPHAGOGASTRODUODENOSCOPY (EGD) WITH PROPOFOL; N/A     Comment:  Procedure: ESOPHAGOGASTRODUODENOSCOPY (EGD) WITH               PROPOFOL;  Surgeon: Lin Landsman, MD;  Location:               [facility] ENDOSCOPY;  Service: Gastroenterology;  Laterality:               N/A; 01/09/2022: LEFT HEART CATH AND CORONARY ANGIOGRAPHY; N/A     Comment:  Procedure: LEFT HEART CATH AND CORONARY ANGIOGRAPHY;                Surgeon: Nelva Bush, MD;  Location: [place]               CV LAB;  Service: Cardiovascular;  Laterality: N/A; No date: NO PAST SURGERIES 04/22/2022: TEE WITHOUT CARDIOVERSION; N/A     Comment:  Procedure: TRANSESOPHAGEAL ECHOCARDIOGRAM (TEE);                Surgeon: Kate Sable, MD;  Location: [facility] ORS;                Service: Cardiovascular;  Laterality: N/A;  BMI    Body Mass Index: 23.06 kg/m      Reproductive/Obstetrics negative OB ROS                              Anesthesia Physical Anesthesia Plan  ASA: 4  Anesthesia Plan: General   Post-op Pain Management: Minimal or no pain anticipated   Induction: Intravenous  PONV Risk Score and Plan: 2 and Propofol infusion and TIVA  Airway Management Planned: Nasal Cannula  Additional Equipment: None  Intra-op Plan:    Post-operative Plan:   Informed Consent: I have reviewed the patients History and Physical, chart, labs and discussed the procedure including the risks, benefits and alternatives for the proposed anesthesia with the patient or authorized representative who has indicated his/her understanding and  acceptance.     Dental Advisory Given and Consent reviewed with POA  Plan Discussed with: Anesthesiologist, CRNA and Surgeon  Anesthesia Plan Comments: (Discussed risks of anesthesia with patient and his wife (legal guardian) at bedside, including possibility of difficulty with spontaneous ventilation under anesthesia necessitating airway intervention, PONV, and rare risks such as cardiac or respiratory or neurological events, and allergic reactions. Discussed the role of CRNA in patient's perioperative care. Wife understands. She was counseled on being higher risk for anesthesia due to comorbidities: recurrent/active aspiration pneumonia, multiple strokes. She  was told about increased risk of cardiac and respiratory events, including death. )         Anesthesia Quick Evaluation

## 2022-08-26 NOTE — Progress Notes (Addendum)
Daily Progress Note   Patient Name: Evan BUCKALEW Sr.       Date: 08/26/2022 DOB: [birth year]  Age: 67 y.o. MRN#: [id number] Attending Physician: Richarda Osmond, MD Primary Care Physician: Jon Billings, NP Admit Date: 08/21/2022  Reason for Consultation/Follow-up: Establishing goals of care  Subjective: Notes and labs reviewed. In to see patient; no family at bedside. He states he is married and has 5 children. He tells me he is in the hospital and tells me he has been updated and understands he cannot swallow safely. He states he is amenable to PEG placement. He understands how this could affect QOL, and understands a comfort path as well.    With further conversation, he states he does not believe he would want to be placed on a ventilator or life support, or have CPR if his heart or breathing stopped. Inquired if he had ever shared his wishes with his wife and he indicates he has not. Discussed the importance of discussing his thoughts and wishes with his wife and that I would try to return at a later time when she is at bedside to help facilitate this conversation. He would like to speak with her before making formal changes to code status.    Length of Stay: 4  Current Medications: Scheduled Meds:   atorvastatin  80 mg Oral QPM   guaiFENesin  1,200 mg Oral BID   hydrALAZINE  10 mg Intravenous Q8H   lidocaine  1 patch Transdermal Q24H   mirtazapine  7.5 mg Oral Daily   mouth rinse  15 mL Mouth Rinse 4 times per day   pantoprazole (PROTONIX) IV  40 mg Intravenous Q24H   sodium chloride flush  3 mL Intravenous Q12H   tamsulosin  0.4 mg Oral Daily    Continuous Infusions:  ampicillin-sulbactam (UNASYN) IV 3 g (08/26/22 0548)   lactated ringers      PRN Meds: acetaminophen  **OR** acetaminophen, albuterol, ondansetron **OR** ondansetron (ZOFRAN) IV, mouth rinse  Physical Exam Pulmonary:     Effort: Pulmonary effort is normal.  Neurological:     Mental Status: He is alert.             Vital Signs: BP (!) 152/80 (BP Location: Left Arm)   Pulse 75   Temp 97.6 F (36.4 C) (Oral)  Resp 17   Ht 5\' 6"  (1.676 m)   Wt 64.8 kg   SpO2 100%   BMI 23.06 kg/m  SpO2: SpO2: 100 % O2 Device: O2 Device: Room Air O2 Flow Rate:    Intake/output summary:  Intake/Output Summary (Last 24 hours) at 08/26/2022 0948 Last data filed at 08/26/2022 0703 Gross per 24 hour  Intake 879.78 ml  Output 1500 ml  Net -620.22 ml   LBM: Last BM Date : 08/25/22 Baseline Weight: Weight: 64.8 kg Most recent weight: Weight: 64.8 kg    Patient Active Problem List   Diagnosis Date Noted   Protein-calorie malnutrition, severe 08/26/2022   Dysphagia 08/25/2022   Septic shock ([place]) 08/24/2022   Other dysphagia 08/24/2022   AKI (acute kidney injury) ([place]) 08/24/2022   Aspiration pneumonia ([place]) 08/21/2022   Hypotension 08/21/2022   Acute kidney injury superimposed on chronic kidney disease ([place]) 06/23/2022   Hypoglycemia 06/22/2022   Stroke ([place]) 06/22/2022   Chronic diastolic CHF (congestive heart failure) ([place]) 06/22/2022   Iron deficiency anemia 06/22/2022   Chronic kidney disease, stage 3b ([place]) 46/96/2952   Acute metabolic encephalopathy 84/13/2440   Type II diabetes mellitus with renal manifestations ([place]) 06/22/2022   Acute ischemic stroke ([place]) 04/22/2022   Slurred speech    Weakness 04/21/2022   CKD stage 3 due to type 2 diabetes mellitus ([place]) 04/21/2022   Hypothermia 01/10/2022   Anemia due to stage 3b chronic kidney disease ([place]) 01/08/2022   Sinus bradycardia 01/07/2022   BPH (benign prostatic hyperplasia) 01/07/2022   Paroxysmal atrial fibrillation ([place]) 01/07/2022   Dyslipidemia 01/07/2022   Elevated liver enzymes 12/28/2021   Sinus pause    Bradycardia  10/16/2021   Hypertensive emergency without congestive heart failure 09/14/2021   Unstable angina (HCC)    Acute CVA (cerebrovascular accident) ([place]) 06/05/2021   Need for influenza vaccination 06/02/2021   Constipation 06/02/2021   Insomnia 06/02/2021   Gastroparesis 02/26/2021   Moderate major depression ([place]) 02/26/2021   Muscle spasm of left lower extremity 02/26/2021   History of stroke 06/13/2535   Diastolic dysfunction    Monitoring for anticoagulant use 06/27/2020   Lower extremity edema 05/16/2020   Atrial fibrillation, chronic ([place]) 10/26/2019   Coagulopathy ([place]) 06/12/2019   Gastroesophageal reflux disease    Hemiplegia and hemiparesis following cerebral infarction affecting left non-dominant side ([place]) 11/25/2018   RBBB 10/15/2018   Dysphagia, post-stroke    Microalbuminuria 07/29/2018   Hyperlipidemia LDL goal <70 02/11/2017   Recurrent strokes ([place]) 11/10/2016   Essential hypertension 11/10/2016   Uncontrolled type 2 diabetes mellitus with hyperglycemia, with long-term current use of insulin ([place]) 11/10/2016   Tobacco abuse 08/28/2013   Peripheral neuropathy 08/28/2013    Palliative Care Assessment & Plan     Recommendations/Plan: PMT will follow.  Patient is amenable to PEG placement Patient does not believe he would want ventilator support or CPR, but would like to speak with his wife prior to formal changes.     Code Status:    Code Status Orders  (From admission, onward)           Start     Ordered   08/21/22 1725  Full code  Continuous       Question:  By:  Answer:  Consent: discussion documented in EHR   08/21/22 1727           Code Status History     Date Active Date Inactive Code Status Order ID Comments User Context  06/22/2022 1154 06/24/2022 1951 Full Code [id number]  Lorretta Harp, MD ED   04/21/2022 1313 04/23/2022 2118 Full Code [id number]  Lucile Shutters, MD ED   01/07/2022 0207 01/11/2022 2133 Full Code [id number]  Mansy, Vernetta Honey, MD  ED   12/28/2021 1856 12/29/2021 1958 Full Code [id number]  Lynn Ito, MD ED   11/26/2021 1239 11/28/2021 1647 Full Code [id number]  Lynn Ito, MD ED   10/16/2021 1653 10/18/2021 1921 Full Code [id number]  Alford Highland, MD ED   09/14/2021 1658 09/16/2021 1851 Full Code [id number]  Inez Catalina, MD Inpatient   09/20/2020 1411 09/21/2020 1626 Full Code [id number]  Lucile Shutters, MD ED   08/05/2020 2110 08/06/2020 1654 Full Code [id number]  Briscoe Deutscher, MD ED   09/05/2019 0005 09/06/2019 2125 Full Code [id number]  Anselm Jungling, DO ED   09/06/2018 1750 09/23/2018 1444 DNR [id number]  Tennis Must, RN Inpatient   09/06/2018 1324 09/06/2018 1750 Full Code [id number]  Charlton Amor, PA-C Inpatient   09/06/2018 1324 09/06/2018 1324 DNR [id number]  Charlton Amor, PA-C Inpatient   09/01/2018 1057 09/06/2018 1323 DNR [id number]  Ramonita Lab, MD ED   09/10/2017 1114 09/11/2017 1901 Full Code [id number]  [name], MD ED   11/10/2016 1447 11/11/2016 1853 Full Code [id number]  Katharina Caper, MD Inpatient       Thank you for allowing the Palliative Medicine Team to assist in the care of this patient.    Morton Stall, NP  Please contact Palliative Medicine Team phone at [phone] for questions and concerns.

## 2022-08-26 NOTE — Progress Notes (Signed)
. Progress Note   Patient: Evan Townsend JJK:[id number] DOB: 1956-01-18 DOA: 08/21/2022     4 DOS: the patient was seen and examined on 08/26/2022   Brief hospital course:  Evan BRADDY Sr. is a 67 y.o. male with medical history significant of recurrent strokes with residual left-sided deficits, known aspiration, HFpEF, CKD stage III, type 2 diabetes, bradycardia who presents to the ED due to generalized weakness. History obtained from patient and his wife as patient has dysarthria at baseline.   Evan Townsend states that patient has been experiencing decreased appetite and generalized weakness over the last several days.  Overnight, she noticed that he was having increased difficulty with breathing in addition to a cough that sounded wet.  She was attempting to help him cough up sputum but feels that his cough is too weak.  She states that SLP has been coming to their home for therapy, but she is still worried that he is silently aspirating.  At this time, Evan Townsend denies any shortness of breath, chest pain, palpitations.  They deny any known fevers, chills, nausea, vomiting, diarrhea, abdominal pain.   ED course: On arrival to the ED, patient was hypotensive at 85/54 with heart rate of 41.  He was afebrile at 98.1.  He was saturating at 99% on room air.  Initial workup remarkable for WBC of 3.2, hemoglobin of 9.2 and platelets of 143.  CMP with bicarb of 20, BUN of 36, creatinine 2.53, alkaline phosphatase of 172, ALT of 68 and GFR of 27.  Lactic acid within normal limits x 2.  INR elevated at 1.6. Chest x-ray was obtained that  COVID-19 PCR, influenza PCR and RSV PCR negative. CXR showed left greater than right bibasilar infiltrates worrisome for pneumonia or aspiration.  TRH contacted for admission for aspiration pneumonia and AKI.  1/8- SLP evaluation MBS determined at extreme aspiration risk and unreversible cause. Recommended continue NPO. Wife decided to move forward with PEG placement.  General surgery consulted and eliquis held.   1/9- stable. Remains on IV d5 and NPO 1/10- stable. Attempting to place NG tube for temporary nutrition support until scheduled PEG placement 1/12.  Assessment and Plan:  Aspiration pneumonia :  Patient presenting with several days of decreased appetite and weakness in the setting of known dysphagia with aspiration in the past. MBSS confirms worsening dysphagia since previous studies. SLP recommends continuing NPO indefinitely. Discussed palliative feeds vs enteral feeds with wife. Included that risk of aspiration remains even with PEG due to even being able to aspirate saliva. She would like to proceed with PEG placement. She was agreeable to discussions with palliative.  - general surgery consulted, appreciate your care  - holding eliquis  - planning PEG placement tentatively 1/10 - palliative consulted.   - no change in management after discussions with wife - c/w  Unasyn until 1/11 - Mucinex for cough/sputum PRN - SLP evaluation - continue NPO  Malnutrition- PEG placement scheduled tentatively for 1/12.  - place temporary NG tube per IR - nutrition support via RD recommendations - general surgery following, appreciate care  - PEG planned 1/12  Hypoglycemia- due to NPO status - discontinued insulin - continue to monitor CBGs - dextrose IV - electrolyte monitoring frequently  Acute kidney injury superimposed on chronic kidney disease :improving with 2.53 >>>1.93 with improvement in BUN.  - continue IV fluids as above. Cannot meet his hydration needs by PO - Trend BMP - Monitor urine output - Avoid nephrotoxic agents  Hypertension- poorly controlled as unable to take his home PO medications.  Started on IV hydralazine scheduled.  - continue IV hydralazine and increase PRN - add back on home antihypertensives as tolerated.   Recurrent strokes (HCC) No focal deficits on examination today that are new, so low suspicion for new  CVA. -Continue home Eliquis  Uncontrolled type 2 diabetes mellitus with hyperglycemia, with long-term current use of insulin (HCC) - Hold home Farxiga  Sinus bradycardia Per chart review, patient has a history of second-degree AV block with negative workup previously.   Asymptomatic from his bradycardia. -Continue outpatient follow-up with cardiology.  Subjective:  Patient seen and examined this morning.  No overnight events.  Denies pain, discomfort.   Physical Exam: Vitals:   08/25/22 1251 08/25/22 1630 08/25/22 2043 08/26/22 0610  BP: (!) 170/84 139/75 (!) 152/87 (!) 158/88  Pulse: 72 69 77 89  Resp:  15 20 20   Temp:  97.6 F (36.4 C) 97.6 F (36.4 C) 98.7 F (37.1 C)  TempSrc:  Oral Oral   SpO2:  100% 99% 100%  Weight:      Height:       No acute distress.  Head/ENT normal cephalic atraumatic.  Pupil equal reactive light commendation.  S1-S2 regular with no grade 3 murmur. -Equal air entry bilaterally with basal Rales pronounced on the right side. -Abdomen nontender nondistended. -Extremity with no edema. -Patient slow alert and oriented to time place and person.  Left upper and lower extremity strength 2/5 right upper and lower extremity strength 5/5 .facial symmetry preserved.  Data Reviewed:  CBC    Component Value Date/Time   WBC 5.2 08/25/2022 0454   RBC 3.28 (L) 08/25/2022 0454   HGB 8.9 (L) 08/25/2022 0454   HGB 9.7 (L) 06/09/2022 1625   HCT 28.2 (L) 08/25/2022 0454   HCT 29.6 (L) 06/09/2022 1625   PLT 145 (L) 08/25/2022 0454   PLT 230 06/09/2022 1625   MCV 86.0 08/25/2022 0454   MCV 83 06/09/2022 1625   MCV 88 03/15/2013 1649   MCH 27.1 08/25/2022 0454   MCHC 31.6 08/25/2022 0454   RDW 18.9 (H) 08/25/2022 0454   RDW 18.4 (H) 06/09/2022 1625   RDW 14.5 03/15/2013 1649   LYMPHSABS 0.5 (L) 08/21/2022 1324   LYMPHSABS 1.7 06/09/2022 1625   LYMPHSABS 2.7 03/15/2013 1649   MONOABS 0.2 08/21/2022 1324   MONOABS 0.5 03/15/2013 1649   EOSABS 0.0  08/21/2022 1324   EOSABS 0.1 06/09/2022 1625   EOSABS 0.1 03/15/2013 1649   BASOSABS 0.0 08/21/2022 1324   BASOSABS 0.0 06/09/2022 1625   BASOSABS 0.1 03/15/2013 1649      Latest Ref Rng & Units 08/26/2022    3:57 AM 08/25/2022    4:54 AM 08/24/2022    2:39 AM  BMP  Glucose 70 - 99 mg/dL 10/23/2022  93  66   BUN 8 - 23 mg/dL 14  17  21    Creatinine 0.61 - 1.24 mg/dL 474   2.59   Sodium 135 - 145 mmol/L 146  144  145   Potassium 3.5 - 5.1 mmol/L 4.0  4.0  4.0   Chloride 98 - 111 mmol/L 117  115  116   CO2 22 - 32 mmol/L 22  22  20    Calcium 8.9 - 10.3 mg/dL 8.9  9.0  8.7     Family Communication: Wife on phone  Disposition: Status is: Inpatient Remains inpatient appropriate because: worsening dysphagia requiring PEG  Planned Discharge Destination: Home  Time spent: 35 minutes  Author: Richarda Osmond, MD 08/26/2022 7:11 AM  For on call review [email or web address].

## 2022-08-26 NOTE — Transfer of Care (Signed)
Immediate Anesthesia Transfer of Care Note  Patient: GUHAN BRUINGTON Sr.  Procedure(s) Performed: ESOPHAGOGASTRODUODENOSCOPY (EGD) PERCUTANEOUS ENDOSCOPIC GASTROSTOMY (PEG) PLACEMENT  Patient Location: PACU  Anesthesia Type:General  Level of Consciousness: drowsy  Airway & Oxygen Therapy: Patient Spontanous Breathing  Post-op Assessment: Report given to RN and Post -op Vital signs reviewed and stable  Post vital signs: Reviewed and stable  Last Vitals:  Vitals Value Taken Time  BP 137/69 08/26/22 1618  Temp    Pulse 65 08/26/22 1621  Resp 16 08/26/22 1621  SpO2 98 % 08/26/22 1621  Vitals shown include unvalidated device data.  Last Pain:  Vitals:   08/26/22 1536  TempSrc: Temporal  PainSc: 0-No pain         Complications: No notable events documented.

## 2022-08-26 NOTE — Interval H&P Note (Signed)
History and Physical Interval Note:  08/26/2022 3:13 PM  Evan Townsend Sr.  has presented today for surgery, with the diagnosis of DYSPHAGIA.  The various methods of treatment have been discussed with the patient and family. After consideration of risks, benefits and other options for treatment, the patient has consented to  Procedure(s): ESOPHAGOGASTRODUODENOSCOPY (EGD) (N/A) PERCUTANEOUS ENDOSCOPIC GASTROSTOMY (PEG) PLACEMENT (N/A) as a surgical intervention.  The patient's history has been reviewed, patient examined, no change in status, stable for surgery.  I have reviewed the patient's chart and labs.  Questions were answered to the patient's satisfaction.     Tomeika Weinmann Lysle Pearl

## 2022-08-26 NOTE — Progress Notes (Incomplete)
       CROSS COVER NOTE  NAME: Evan GUMINA Sr. MRN: [id number] DOB : April 24, 1956 ATTENDING PHYSICIAN: Richarda Osmond, MD    Date of Service   08/26/2022   HPI/Events of Note   7/10 abdominal pain  Interventions   Assessment/Plan:  Morphine 1mg  IV X X    *** professional thanks      To reach the provider On-Call:   7AM- 7PM see care teams to locate the attending and reach out to them via [email or web address] Password: [email or web address] 7PM-7AM contact night-coverage If you still have difficulty reaching the appropriate provider, please page the [facility] (Director on Call) for [facility] on amion for assistance  This document was prepared using Systems analyst and may include unintentional dictation errors.  Neomia Glass DNP, MBA, FNP-BC Nurse Practitioner [facility] Pager [phone]

## 2022-08-26 NOTE — Care Management Important Message (Signed)
Important Message  Patient Details  Name: Evan SHOCK Sr. MRN: [id number] Date of Birth: January 30, 1956   Medicare Important Message Given:  Yes     Juliann Pulse A Malic Rosten 08/26/2022, 2:33 PM

## 2022-08-27 ENCOUNTER — Encounter: Payer: Self-pay | Admitting: Surgery

## 2022-08-27 DIAGNOSIS — J690 Pneumonitis due to inhalation of food and vomit: Secondary | ICD-10-CM | POA: Diagnosis not present

## 2022-08-27 DIAGNOSIS — N179 Acute kidney failure, unspecified: Secondary | ICD-10-CM | POA: Diagnosis not present

## 2022-08-27 DIAGNOSIS — Z7189 Other specified counseling: Secondary | ICD-10-CM | POA: Diagnosis not present

## 2022-08-27 DIAGNOSIS — A419 Sepsis, unspecified organism: Secondary | ICD-10-CM | POA: Diagnosis not present

## 2022-08-27 DIAGNOSIS — Z931 Gastrostomy status: Secondary | ICD-10-CM

## 2022-08-27 LAB — BASIC METABOLIC PANEL
Anion gap: 10 (ref 5–15)
BUN: 14 mg/dL (ref 8–23)
CO2: 19 mmol/L — ABNORMAL LOW (ref 22–32)
Calcium: 8.7 mg/dL — ABNORMAL LOW (ref 8.9–10.3)
Chloride: 116 mmol/L — ABNORMAL HIGH (ref 98–111)
Creatinine, Ser: 1.95 mg/dL — ABNORMAL HIGH (ref 0.61–1.24)
GFR, Estimated: 37 mL/min — ABNORMAL LOW (ref >60)
Glucose, Bld: 69 mg/dL — ABNORMAL LOW (ref 70–99)
Potassium: 4.1 mmol/L (ref 3.5–5.1)
Sodium: 145 mmol/L (ref 135–145)

## 2022-08-27 LAB — CBC
HCT: 27 % — ABNORMAL LOW (ref 39.0–52.0)
Hemoglobin: 8.2 g/dL — ABNORMAL LOW (ref 13.0–17.0)
MCH: 26.5 pg (ref 26.0–34.0)
MCHC: 30.4 g/dL (ref 30.0–36.0)
MCV: 87.1 fL (ref 80.0–100.0)
Platelets: 117 10*3/uL — ABNORMAL LOW (ref 150–400)
RBC: 3.1 MIL/uL — ABNORMAL LOW (ref 4.22–5.81)
RDW: 18.9 % — ABNORMAL HIGH (ref 11.5–15.5)
WBC: 6 10*3/uL (ref 4.0–10.5)
nRBC: 0.3 % — ABNORMAL HIGH (ref 0.0–0.2)

## 2022-08-27 LAB — GLUCOSE, CAPILLARY
Glucose-Capillary: 97 mg/dL (ref 70–99)
Glucose-Capillary: 78 mg/dL (ref 70–99)
Glucose-Capillary: 69 mg/dL — ABNORMAL LOW (ref 70–99)
Glucose-Capillary: 162 mg/dL — ABNORMAL HIGH (ref 70–99)
Glucose-Capillary: 152 mg/dL — ABNORMAL HIGH (ref 70–99)

## 2022-08-27 LAB — PHOSPHORUS
Phosphorus: 3.6 mg/dL (ref 2.5–4.6)
Phosphorus: 3.6 mg/dL (ref 2.5–4.6)

## 2022-08-27 LAB — MAGNESIUM
Magnesium: 1.8 mg/dL (ref 1.7–2.4)
Magnesium: 1.8 mg/dL (ref 1.7–2.4)

## 2022-08-27 MED ORDER — ACETAMINOPHEN 650 MG RE SUPP: 650.0000 mg | Freq: Four times a day (QID) | RECTAL | Status: DC | PRN

## 2022-08-27 MED ORDER — ONDANSETRON HCL 4 MG PO TABS: 4.0000 mg | ORAL_TABLET | Freq: Four times a day (QID) | ORAL | Status: DC | PRN

## 2022-08-27 MED ORDER — ACETAMINOPHEN 160 MG/5ML PO SOLN
650.0000 mg | Freq: Four times a day (QID) | ORAL | Status: DC | PRN
  Administered 2022-08-27: 650 mg
  Filled 2022-08-27 (×2): qty 20.3

## 2022-08-27 MED ORDER — MIRTAZAPINE 15 MG PO TABS
7.5000 mg | ORAL_TABLET | Freq: Every day | ORAL | Status: DC
  Administered 2022-08-28 – 2022-08-31 (×4): 7.5 mg
  Filled 2022-08-27 (×4): qty 1

## 2022-08-27 MED ORDER — APIXABAN 5 MG PO TABS: 5.0000 mg | ORAL_TABLET | Freq: Two times a day (BID) | ORAL | Status: DC

## 2022-08-27 MED ORDER — GUAIFENESIN 100 MG/5ML PO LIQD: 5.0000 mL | Freq: Four times a day (QID) | ORAL | Status: DC | PRN

## 2022-08-27 MED ORDER — AMLODIPINE BESYLATE 10 MG PO TABS
10.0000 mg | ORAL_TABLET | Freq: Every day | ORAL | Status: DC
  Administered 2022-08-28 – 2022-08-31 (×4): 10 mg
  Filled 2022-08-27 (×4): qty 1

## 2022-08-27 MED ORDER — ONDANSETRON HCL 4 MG/2ML IJ SOLN
4.0000 mg | Freq: Four times a day (QID) | INTRAMUSCULAR | Status: DC | PRN
  Administered 2022-08-27: 4 mg via INTRAVENOUS
  Filled 2022-08-27 (×2): qty 2

## 2022-08-27 MED ORDER — ATORVASTATIN CALCIUM 20 MG PO TABS
80.0000 mg | ORAL_TABLET | Freq: Every evening | ORAL | Status: DC
  Administered 2022-08-27 – 2022-08-29 (×3): 80 mg
  Filled 2022-08-27 (×3): qty 4

## 2022-08-27 MED ORDER — AMLODIPINE BESYLATE 10 MG PO TABS: 10.0000 mg | ORAL_TABLET | Freq: Every day | ORAL | Status: DC

## 2022-08-27 MED ORDER — APIXABAN 5 MG PO TABS
5.0000 mg | ORAL_TABLET | Freq: Two times a day (BID) | ORAL | Status: DC
  Administered 2022-08-27 – 2022-08-30 (×6): 5 mg
  Filled 2022-08-27 (×6): qty 1

## 2022-08-27 NOTE — Progress Notes (Signed)
Subjective:  CC: Evan FOUCHE Sr. is a 67 y.o. male  Hospital stay day 5, 1 Day Post-Op PEG tube placement  HPI: No issues reported overnight.  ROS:  General: Denies weight loss, weight gain, fatigue, fevers, chills, and night sweats. Heart: Denies chest pain, palpitations, racing heart, irregular heartbeat, leg pain or swelling, and decreased activity tolerance. Respiratory: Denies breathing difficulty, shortness of breath, wheezing, cough, and sputum. GI: Denies change in appetite, heartburn, nausea, vomiting, constipation, diarrhea, and blood in stool. GU: Denies difficulty urinating, pain with urinating, urgency, frequency, blood in urine.   Objective:   Temp:  [97.1 F (36.2 C)-98.3 F (36.8 C)] 97.9 F (36.6 C) (01/11 0835) Pulse Rate:  [65-96] 96 (01/11 0835) Resp:  [16-20] 18 (01/11 0835) BP: (111-176)/(63-92) 150/92 (01/11 0835) SpO2:  [96 %-100 %] 100 % (01/11 0835) Weight:  [64.2 kg-64.8 kg] 64.2 kg (01/11 0504)     Height: 5\' 6"  (167.6 cm) Weight: 64.2 kg BMI (Calculated): 22.86   Intake/Output this shift:   Intake/Output Summary (Last 24 hours) at 08/27/2022 1205 Last data filed at 08/27/2022 0800 Gross per 24 hour  Intake 600 ml  Output 2750 ml  Net -2150 ml    Constitutional :  alert, cooperative, and appears stated age  Respiratory:  clear to auscultation bilaterally  Cardiovascular:  regular rate and rhythm  Gastrointestinal: soft, non-tender; bowel sounds normal; no masses,  no organomegaly and PEG in place, no active bleed .   Skin: Cool and moist.   Psychiatric: Normal affect, non-agitated, not confused       LABS:     Latest Ref Rng & Units 08/27/2022    5:15 AM 08/26/2022    3:57 AM 08/25/2022    4:54 AM  CMP  Glucose 70 - 99 mg/dL 69  130  93   BUN 8 - 23 mg/dL 14  14  17    Creatinine 0.61 - 1.24 mg/dL 1.95  1.93  1.96   Sodium 135 - 145 mmol/L 145  146  144   Potassium 3.5 - 5.1 mmol/L 4.1  4.0  4.0   Chloride 98 - 111 mmol/L 116  117  115    CO2 22 - 32 mmol/L 19  22  22    Calcium 8.9 - 10.3 mg/dL 8.7  8.9  9.0       Latest Ref Rng & Units 08/27/2022    5:15 AM 08/25/2022    4:54 AM 08/24/2022    2:39 AM  CBC  WBC 4.0 - 10.5 K/uL 6.0  5.2  4.7   Hemoglobin 13.0 - 17.0 g/dL 8.2  8.9  8.5   Hematocrit 39.0 - 52.0 % 27.0  28.2  27.5   Platelets 150 - 400 K/uL 117  145  120     RADS: N/a Assessment:   S/p PEG placement.  No issues.  Ok to starting using.  Surgery to sign off.  Call with questions.  labs/images/medications/previous chart entries reviewed personally and relevant changes/updates noted above.

## 2022-08-27 NOTE — Progress Notes (Signed)
. Progress Note   Evan Townsend: Evan Townsend GUR:[id number] DOB: 1956/02/22 DOA: 08/21/2022     5 DOS: the Evan Townsend was seen and examined on 08/27/2022   Brief hospital course:  Evan DEGROAT Sr. is a 67 y.o. male with medical history significant of recurrent strokes with residual left-sided deficits, known aspiration, HFpEF, CKD stage III, type 2 diabetes, bradycardia who presents to the ED due to generalized weakness. History obtained from Evan Townsend and his wife as Evan Townsend has dysarthria at baseline.   Evan Townsend states that Evan Townsend has been experiencing decreased appetite and generalized weakness over the last several days.  Overnight, she noticed that he was having increased difficulty with breathing in addition to a cough that sounded wet.  She was attempting to help him cough up sputum but feels that his cough is too weak.  She states that SLP has been coming to their home for therapy, but she is still worried that he is silently aspirating.  At this time, Evan Townsend denies any shortness of breath, chest pain, palpitations.  They deny any known fevers, chills, nausea, vomiting, diarrhea, abdominal pain.   ED course: On arrival to the ED, Evan Townsend was hypotensive at 85/54 with heart rate of 41.  He was afebrile at 98.1.  He was saturating at 99% on room air.  Initial workup remarkable for WBC of 3.2, hemoglobin of 9.2 and platelets of 143.  CMP with bicarb of 20, BUN of 36, creatinine 2.53, alkaline phosphatase of 172, ALT of 68 and GFR of 27.  Lactic acid within normal limits x 2.  INR elevated at 1.6. Chest x-ray was obtained that  COVID-19 PCR, influenza PCR and RSV PCR negative. CXR showed left greater than right bibasilar infiltrates worrisome for pneumonia or aspiration.  TRH contacted for admission for aspiration pneumonia and AKI.  1/8- SLP evaluation MBS determined at extreme aspiration risk and unreversible cause. Recommended continue NPO. Wife decided to move forward with PEG placement.  General surgery consulted and eliquis held.   1/9- stable. Remains on IV d5 and NPO 1/10- PEG placed 1/11- initiated nutrition through PEG went well. Providing education on PEG use to wife so that she can become familiar and be able to provide his support at home. Hopeful to dc home 1/12.  Assessment and Plan:  Aspiration pneumonia :  Evan Townsend presenting with several days of decreased appetite and weakness in the setting of known dysphagia with aspiration in the past. MBSS confirms worsening dysphagia since previous studies. SLP recommends continuing NPO indefinitely. Discussed palliative feeds vs enteral feeds with wife. Included that risk of aspiration remains even with PEG due to even being able to aspirate saliva. She chose to proceed with PEG placement. She was agreeable to discussions with palliative.  Currently stable ORA for several days - general surgery consulted, signed off  - PEG placement 1/10 - palliative consulted.   - no change in management after discussions with wife - c/w  Unasyn until 1/11 - Mucinex for cough/sputum PRN - SLP evaluation - continue NPO - PT/OT  Malnutrition- PEG placement 1/10. Initiated feeding and meds through PEG 1/11. Evan Townsend's wife receiving education on administration through PEG.  - nutrition support via RD recommendations - monitoring for refeeding syndrome  Hypoglycemia- due to NPO status. Resolved.  - discontinued insulin - continue to monitor CBGs - electrolyte monitoring frequently  AKI on CKD- improving with IV fluids 2.53 >>>1.93. baseline around 1.6. Cannot meet his hydration needs by PO - Trend BMP -  Monitor urine output - Avoid nephrotoxic agents  Hypertension- poorly controlled as unable to take his home PO medications.  Started on IV hydralazine scheduled.  - continue IV hydralazine and increase PRN - add back on home antihypertensives as tolerated.   Recurrent strokes ([name]) -restart home Eliquis as he is  POD#1  Uncontrolled type 2 diabetes mellitus with hyperglycemia, with long-term current use of insulin ([name]) - Hold home Farxiga  Subjective:  Evan Townsend seen and examined this morning.  No overnight events.  Denies pain, discomfort. Wife requests pain medication for him since she thinks he is in pain but not saying anything about it.   Physical Exam: Vitals:   08/26/22 1755 08/26/22 1810 08/26/22 2044 08/27/22 0504  BP: (!) 162/86 (!) 158/86 (!) 166/90 (!) 171/85  Pulse: 74 73 73 78  Resp: 16 17 19 18   Temp: 97.7 F (36.5 C) 97.7 F (36.5 C) 97.6 F (36.4 C) 98 F (36.7 C)  TempSrc: Oral Oral Oral Oral  SpO2: 96% 96% 100% 100%  Weight:    64.2 kg  Height:       No acute distress.  Head/ENT normal cephalic atraumatic.  Pupil equal reactive light commendation.  S1-S2 regular with no grade 3 murmur. -Equal air entry bilaterally with basal Rales pronounced on the right side. -Abdomen nontender nondistended. -Extremity with no edema. -Evan Townsend slow alert and oriented to time place and person.  Left upper and lower extremity strength 2/5 right upper and lower extremity strength 5/5 .facial symmetry preserved.  Data Reviewed:  CBC    Component Value Date/Time   WBC 6.0 08/27/2022 0515   RBC 3.10 (L) 08/27/2022 0515   HGB 8.2 (L) 08/27/2022 0515   HGB 9.7 (L) 06/09/2022 1625   HCT 27.0 (L) 08/27/2022 0515   HCT 29.6 (L) 06/09/2022 1625   PLT 117 (L) 08/27/2022 0515   PLT 230 06/09/2022 1625   MCV 87.1 08/27/2022 0515   MCV 83 06/09/2022 1625   MCV 88 03/15/2013 1649   MCH 26.5 08/27/2022 0515   MCHC 30.4 08/27/2022 0515   RDW 18.9 (H) 08/27/2022 0515   RDW 18.4 (H) 06/09/2022 1625   RDW 14.5 03/15/2013 1649   LYMPHSABS 0.5 (L) 08/21/2022 1324   LYMPHSABS 1.7 06/09/2022 1625   LYMPHSABS 2.7 03/15/2013 1649   MONOABS 0.2 08/21/2022 1324   MONOABS 0.5 03/15/2013 1649   EOSABS 0.0 08/21/2022 1324   EOSABS 0.1 06/09/2022 1625   EOSABS 0.1 03/15/2013 1649   BASOSABS 0.0  08/21/2022 1324   BASOSABS 0.0 06/09/2022 1625   BASOSABS 0.1 03/15/2013 1649      Latest Ref Rng & Units 08/27/2022    5:15 AM 08/26/2022    3:57 AM 08/25/2022    4:54 AM  BMP  Glucose 70 - 99 mg/dL 69  130  93   BUN 8 - 23 mg/dL 14  14  17    Creatinine 0.61 - 1.24 mg/dL 1.95  1.93  1.96   Sodium 135 - 145 mmol/L 145  146  144   Potassium 3.5 - 5.1 mmol/L 4.1  4.0  4.0   Chloride 98 - 111 mmol/L 116  117  115   CO2 22 - 32 mmol/L 19  22  22    Calcium 8.9 - 10.3 mg/dL 8.7  8.9  9.0     Family Communication: Wife on phone  Disposition: Status is: Inpatient Remains inpatient appropriate because: worsening dysphagia requiring PEG   Planned Discharge Destination: Home  Time spent: 8  minutes  Author: Leeroy Bock, MD 08/27/2022 7:10 AM  For on call review [email or web address].

## 2022-08-27 NOTE — Progress Notes (Signed)
Daily Progress Note   Patient Name: Evan SWOPES Sr.       Date: 08/27/2022 DOB: 06/15/1956  Age: 67 y.o. MRN#: [id number] Attending Physician: Leeroy Bock, MD Primary Care Physician: Larae Grooms, NP Admit Date: 08/21/2022  Reason for Consultation/Follow-up: Establishing goals of care  Subjective: Notes and labs reviewed. In to see patient, and PEG is in place. He denies complaint at this time. He states he was able to talk to his wife yesterday regarding his wishes on care, and had hoped she would want to honor his wishes. He states he will continue to talk to her regarding his wishes on care in the future including code status and vent support. He does not wish to change code status at this time.   Length of Stay: 5  Current Medications: Scheduled Meds:   atorvastatin  80 mg Oral QPM   feeding supplement (PROSource TF20)  60 mL Per Tube Daily   free water  150 mL Per Tube Q4H   guaiFENesin  1,200 mg Oral BID   hydrALAZINE  20 mg Intravenous Q8H   lidocaine  1 patch Transdermal Q24H   mirtazapine  7.5 mg Oral Daily   mouth rinse  15 mL Mouth Rinse 4 times per day   pantoprazole (PROTONIX) IV  40 mg Intravenous Q24H   sodium chloride flush  3 mL Intravenous Q12H   tamsulosin  0.4 mg Oral Daily    Continuous Infusions:  ampicillin-sulbactam (UNASYN) IV 3 g (08/27/22 1140)   feeding supplement (OSMOLITE 1.5 CAL)      PRN Meds: acetaminophen **OR** acetaminophen, albuterol, ondansetron **OR** ondansetron (ZOFRAN) IV, mouth rinse  Physical Exam Pulmonary:     Effort: Pulmonary effort is normal.  Neurological:     Mental Status: He is alert.             Vital Signs: BP (!) 150/92 (BP Location: Left Arm)   Pulse 96   Temp 97.9 F (36.6 C) (Oral)   Resp 18    Ht 5\' 6"  (1.676 m)   Wt 64.2 kg   SpO2 100%   BMI 22.84 kg/m  SpO2: SpO2: 100 % O2 Device: O2 Device: Room Air O2 Flow Rate:    Intake/output summary:  Intake/Output Summary (Last 24 hours) at 08/27/2022 1408 Last data filed at 08/27/2022  0800 Gross per 24 hour  Intake 600 ml  Output 2750 ml  Net -2150 ml   LBM: Last BM Date : 08/25/22 Baseline Weight: Weight: 64.8 kg Most recent weight: Weight: 64.2 kg         Patient Active Problem List   Diagnosis Date Noted   Protein-calorie malnutrition, severe 08/26/2022   Dysphagia 08/25/2022   Septic shock ([place]) 08/24/2022   Other dysphagia 08/24/2022   AKI (acute kidney injury) ([place]) 08/24/2022   Aspiration pneumonia ([place]) 08/21/2022   Hypotension 08/21/2022   Acute kidney injury superimposed on chronic kidney disease ([place]) 06/23/2022   Hypoglycemia 06/22/2022   Stroke ([place]) 06/22/2022   Chronic diastolic CHF (congestive heart failure) ([place]) 06/22/2022   Iron deficiency anemia 06/22/2022   Chronic kidney disease, stage 3b ([place]) 61/95/0932   Acute metabolic encephalopathy 67/07/4579   Type II diabetes mellitus with renal manifestations ([place]) 06/22/2022   Acute ischemic stroke ([place]) 04/22/2022   Slurred speech    Weakness 04/21/2022   CKD stage 3 due to type 2 diabetes mellitus ([place]) 04/21/2022   Hypothermia 01/10/2022   Anemia due to stage 3b chronic kidney disease ([place]) 01/08/2022   Sinus bradycardia 01/07/2022   BPH (benign prostatic hyperplasia) 01/07/2022   Paroxysmal atrial fibrillation ([place]) 01/07/2022   Dyslipidemia 01/07/2022   Elevated liver enzymes 12/28/2021   Sinus pause    Bradycardia 10/16/2021   Hypertensive emergency without congestive heart failure 09/14/2021   Unstable angina (HCC)    Acute CVA (cerebrovascular accident) ([place]) 06/05/2021   Need for influenza vaccination 06/02/2021   Constipation 06/02/2021   Insomnia 06/02/2021   Gastroparesis 02/26/2021   Moderate major depression ([place]) 02/26/2021    Muscle spasm of left lower extremity 02/26/2021   History of stroke 99/83/3825   Diastolic dysfunction    Monitoring for anticoagulant use 06/27/2020   Lower extremity edema 05/16/2020   Atrial fibrillation, chronic ([place]) 10/26/2019   Coagulopathy ([place]) 06/12/2019   Gastroesophageal reflux disease    Hemiplegia and hemiparesis following cerebral infarction affecting left non-dominant side ([place]) 11/25/2018   RBBB 10/15/2018   Dysphagia, post-stroke    Microalbuminuria 07/29/2018   Hyperlipidemia LDL goal <70 02/11/2017   Recurrent strokes ([place]) 11/10/2016   Essential hypertension 11/10/2016   Uncontrolled type 2 diabetes mellitus with hyperglycemia, with long-term current use of insulin ([place]) 11/10/2016   Tobacco abuse 08/28/2013   Peripheral neuropathy 08/28/2013    Palliative Care Assessment & Plan    Recommendations/Plan: Recommend continued conversations between patient and wife.  PEG has been placed and patient tolerating well.   Code Status:    Code Status Orders  (From admission, onward)           Start     Ordered   08/21/22 1725  Full code  Continuous       Question:  By:  Answer:  Consent: discussion documented in EHR   08/21/22 1727           Code Status History     Date Active Date Inactive Code Status Order ID Comments User Context   06/22/2022 1154 06/24/2022 1951 Full Code [id number]  Ivor Costa, MD ED   04/21/2022 1313 04/23/2022 2118 Full Code [id number]  Collier Bullock, MD ED   01/07/2022 0207 01/11/2022 2133 Full Code [id number]  Mansy, Arvella Merles, MD ED   12/28/2021 1856 12/29/2021 1958 Full Code [id number]  Nolberto Hanlon, MD ED   11/26/2021 1239 11/28/2021 1647 Full Code [id number]  Nolberto Hanlon, MD ED  10/16/2021 1653 10/18/2021 1921 Full Code [id number]  Loletha Grayer, MD ED   09/14/2021 1658 09/16/2021 1851 Full Code [id number]  Sid Falcon, MD Inpatient   09/20/2020 1411 09/21/2020 1626 Full Code [id number]  Collier Bullock, MD ED   08/05/2020 2110  08/06/2020 1654 Full Code [id number]  Vianne Bulls, MD ED   09/05/2019 0005 09/06/2019 2125 Full Code [id number]  Orene Desanctis, DO ED   09/06/2018 1750 09/23/2018 1444 DNR [id number]  Cristi Loron, RN Inpatient   09/06/2018 1324 09/06/2018 1750 Full Code [id number]  Cathlyn Parsons, PA-C Inpatient   09/06/2018 1324 09/06/2018 1324 DNR [id number]  Cathlyn Parsons, PA-C Inpatient   09/01/2018 1057 09/06/2018 1323 DNR [id number]  Nicholes Mango, MD ED   09/10/2017 1114 09/11/2017 1901 Full Code [id number]  Bettey Costa, MD ED   11/10/2016 1447 11/11/2016 1853 Full Code [id number]  Theodoro Grist, MD Inpatient       Prognosis:  Unable to determine    Thank you for allowing the Palliative Medicine Team to assist in the care of this patient.   Asencion Gowda, NP  Please contact Palliative Medicine Team phone at [phone] for questions and concerns.

## 2022-08-27 NOTE — Progress Notes (Addendum)
Nutrition Follow-up  DOCUMENTATION CODES:   Severe malnutrition in context of chronic illness  INTERVENTION:   -TF via PEG:  Osmolite 1.5 @ 20 ml/hr and increase by 10 ml every 12 hours to goal rate of 55 ml/hr.    60 ml Prosource TF daily   150 ml free water flush every 4 hours     Tube feeding regimen provides 20260 kcal (100% of needs), 103 grams of protein, and 1006 ml of H2O.  Total free water: 1906 ml daily   -Monitor Mg, K, and Phos daily and replete as needed secondary to high refeeding risk  -Once pt able to tolerate TF without refeeding, plan to transition to bolus feedings:   237 ml Osmolite 1.5 6 times daily  70 ml free water flush before and after each feeding administration  Tube feeding regimen provides 2130 kcal (100% of needs), 89 grams of protein, and 1140 ml of H2O. Total free water: 1986 ml daily   NUTRITION DIAGNOSIS:   Severe Malnutrition related to chronic illness (CVA) as evidenced by percent weight loss, moderate fat depletion, severe fat depletion, moderate muscle depletion, severe muscle depletion.  Ongoing  GOAL:   Patient will meet greater than or equal to 90% of their needs  Progressing   MONITOR:   TF tolerance  REASON FOR ASSESSMENT:   Low Braden    ASSESSMENT:   Pt with medical history significant of recurrent strokes with residual left-sided deficits, known aspiration, HFpEF, CKD stage III, type 2 diabetes, bradycardia who presents due to generalized weakness.  1/10- s/p surgical g-tube placement  Reviewed I/O's: -2.2 L x 24 hours and -5.7 L since admission  UOP: 2.8 L x 24 hours   Per general surgery notes, g-tube is ready to use.   Medications reviewed and include remeron.   Labs reviewed: CBGS: 69-97 (inpatient orders for glycemic control are none).    Diet Order:   Diet Order             Diet NPO time specified  Diet effective now                   EDUCATION NEEDS:   No education needs have been  identified at this time  Skin:  Skin Assessment: Reviewed RN Assessment  Last BM:  08/25/22  Height:   Ht Readings from Last 1 Encounters:  08/26/22 5\' 6"  (1.676 m)    Weight:   Wt Readings from Last 1 Encounters:  08/27/22 64.2 kg    Ideal Body Weight:  64.5 kg  BMI:  Body mass index is 22.84 kg/m.  Estimated Nutritional Needs:   Kcal:  1900-2100  Protein:  95-110 grams  Fluid:  > 1.9 L    Loistine Chance, RD, LDN, [place] Registered Dietitian II Certified Diabetes Care and Education Specialist Please refer to [facility] for RD and/or RD on-call/weekend/after hours pager

## 2022-08-27 NOTE — Evaluation (Signed)
Physical Therapy Evaluation Patient Details Name: Evan Townsend. MRN: [id number] DOB: 05-May-1956 Today's Date: 08/27/2022  History of Present Illness  Evan Townsend. is a 67 y.o. male with medical history significant of recurrent strokes with residual left-sided deficits, known aspiration, HFpEF, CKD stage III, type 2 diabetes, bradycardia who presents to the ED due to generalized weakness. History obtained from patient and his wife as patient has dysarthria at baseline.    Clinical Impression  Pt received in Semi-Fowler's position and agreeable to therapy with 30 deg of incline at head of bed.  Pt agreeable to therapy, but refused any mobility due to PEG tubing at this time.  Pt advised that threapist would assist with mobility to prevent any kinks of the line and to assist with mobility, but pt refused at this time.  Pt was agreeable to perform bed-level exercises and put forth good effort with those.  Pt does require AAROM for the L side due to prior stroke.  Pt able to assist with engaging muscles during eccentric lowering on the L LE and would benefit from continued therapy going forward during hospital stay.  If pat had adequate help like he states at home, and has had in prior admissions, pt is appropriate to go back home with HHPT.         Recommendations for follow up therapy are one component of a multi-disciplinary discharge planning process, led by the attending physician.  Recommendations may be updated based on patient status, additional functional criteria and insurance authorization.  Follow Up Recommendations Home health PT      Assistance Recommended at Discharge Frequent or constant Supervision/Assistance  Patient can return home with the following  A little help with walking and/or transfers;A little help with bathing/dressing/bathroom;Assistance with cooking/housework    Equipment Recommendations None recommended by PT  Recommendations for Other Services        Functional Status Assessment Patient has had a recent decline in their functional status and demonstrates the ability to make significant improvements in function in a reasonable and predictable amount of time.     Precautions / Restrictions Restrictions Weight Bearing Restrictions: No      Mobility  Bed Mobility               General bed mobility comments: pt refused bed mobility upon initial evaluation.    Transfers                        Ambulation/Gait                  Stairs            Wheelchair Mobility    Modified Rankin (Stroke Patients Only)       Balance Overall balance assessment: History of Falls                                           Pertinent Vitals/Pain Pain Assessment Pain Assessment: No/denies pain    Home Living                          Prior Function                       Hand Dominance        Extremity/Trunk Assessment  Upper Extremity Assessment Upper Extremity Assessment: Defer to OT evaluation;LUE deficits/detail LUE Deficits / Details: Left sided weakness from prior stroke.    Lower Extremity Assessment Lower Extremity Assessment: Generalized weakness;LLE deficits/detail LLE Deficits / Details: Left sided weakness from prior stroke.       Communication      Cognition                                                General Comments      Exercises Total Joint Exercises Ankle Circles/Pumps: AROM, Strengthening, Both, 20 reps, Supine Quad Sets: AROM, Strengthening, Both, 10 reps, Supine Gluteal Sets: AROM, Strengthening, Both, 10 reps, Supine Towel Squeeze: AROM, Strengthening, Both, 10 reps, Supine Heel Slides: AROM, Strengthening, Both, 10 reps, Supine Hip ABduction/ADduction: AROM, Strengthening, Both, 10 reps, Supine Straight Leg Raises: AROM, Strengthening, Both, 10 reps, Supine   Assessment/Plan    PT Assessment Patient  needs continued PT services  PT Problem List Decreased strength;Decreased range of motion;Decreased activity tolerance;Decreased balance;Decreased mobility;Decreased safety awareness       PT Treatment Interventions DME instruction;Gait training;Stair training;Functional mobility training;Therapeutic activities;Therapeutic exercise;Balance training;Neuromuscular re-education    PT Goals (Current goals can be found in the Care Plan section)  Acute Rehab PT Goals Patient Stated Goal: to go home. PT Goal Formulation: With patient Time For Goal Achievement: 09/10/22 Potential to Achieve Goals: Good    Frequency Min 2X/week     Co-evaluation               AM-PAC PT "6 Clicks" Mobility  Outcome Measure Help needed turning from your back to your side while in a flat bed without using bedrails?: A Lot Help needed moving from lying on your back to sitting on the side of a flat bed without using bedrails?: A Lot Help needed moving to and from a bed to a chair (including a wheelchair)?: A Lot Help needed standing up from a chair using your arms (e.g., wheelchair or bedside chair)?: A Lot Help needed to walk in hospital room?: Total Help needed climbing 3-5 steps with a railing? : Total 6 Click Score: 10    End of Session Equipment Utilized During Treatment: Gait belt Activity Tolerance: Patient tolerated treatment well Patient left: in bed Nurse Communication: Mobility status PT Visit Diagnosis: Unsteadiness on feet (R26.81);Other abnormalities of gait and mobility (R26.89);Muscle weakness (generalized) (M62.81);History of falling (Z91.81);Difficulty in walking, not elsewhere classified (R26.2);Adult, failure to thrive (R62.7)    Time: [id number] PT Time Calculation (min) (ACUTE ONLY): 34 min   Charges:   PT Evaluation $PT Eval Low Complexity: 1 Low PT Treatments $Therapeutic Exercise: 23-37 mins        Gwenlyn Saran, PT, DPT Physical Therapist- [facility]  08/27/22, 5:38 PM

## 2022-08-28 DIAGNOSIS — J690 Pneumonitis due to inhalation of food and vomit: Secondary | ICD-10-CM | POA: Diagnosis not present

## 2022-08-28 DIAGNOSIS — Z7189 Other specified counseling: Secondary | ICD-10-CM | POA: Diagnosis not present

## 2022-08-28 LAB — BASIC METABOLIC PANEL
Anion gap: 8 (ref 5–15)
BUN: 24 mg/dL — ABNORMAL HIGH (ref 8–23)
CO2: 22 mmol/L (ref 22–32)
Calcium: 8.4 mg/dL — ABNORMAL LOW (ref 8.9–10.3)
Chloride: 115 mmol/L — ABNORMAL HIGH (ref 98–111)
Creatinine, Ser: 2.11 mg/dL — ABNORMAL HIGH (ref 0.61–1.24)
GFR, Estimated: 34 mL/min — ABNORMAL LOW (ref >60)
Glucose, Bld: 161 mg/dL — ABNORMAL HIGH (ref 70–99)
Potassium: 4.3 mmol/L (ref 3.5–5.1)
Sodium: 145 mmol/L (ref 135–145)

## 2022-08-28 LAB — CBC
HCT: 26.9 % — ABNORMAL LOW (ref 39.0–52.0)
Hemoglobin: 8.2 g/dL — ABNORMAL LOW (ref 13.0–17.0)
MCH: 26.7 pg (ref 26.0–34.0)
MCHC: 30.5 g/dL (ref 30.0–36.0)
MCV: 87.6 fL (ref 80.0–100.0)
Platelets: 144 10*3/uL — ABNORMAL LOW (ref 150–400)
RBC: 3.07 MIL/uL — ABNORMAL LOW (ref 4.22–5.81)
RDW: 18.6 % — ABNORMAL HIGH (ref 11.5–15.5)
WBC: 6.8 10*3/uL (ref 4.0–10.5)
nRBC: 0 % (ref 0.0–0.2)

## 2022-08-28 LAB — GLUCOSE, CAPILLARY
Glucose-Capillary: 171 mg/dL — ABNORMAL HIGH (ref 70–99)
Glucose-Capillary: 178 mg/dL — ABNORMAL HIGH (ref 70–99)
Glucose-Capillary: 129 mg/dL — ABNORMAL HIGH (ref 70–99)
Glucose-Capillary: 126 mg/dL — ABNORMAL HIGH (ref 70–99)
Glucose-Capillary: 131 mg/dL — ABNORMAL HIGH (ref 70–99)

## 2022-08-28 MED ORDER — OXYCODONE HCL 5 MG PO TABS
5.0000 mg | ORAL_TABLET | ORAL | Status: DC | PRN
  Administered 2022-08-28 – 2022-08-30 (×2): 5 mg via ORAL
  Filled 2022-08-28 (×2): qty 1

## 2022-08-28 MED ORDER — OSMOLITE 1.5 CAL PO LIQD
237.0000 mL | Freq: Every day | ORAL | Status: DC
  Administered 2022-08-28 – 2022-08-31 (×12): 237 mL

## 2022-08-28 MED ORDER — ATORVASTATIN CALCIUM 80 MG PO TABS: 80.0000 mg | ORAL_TABLET | Freq: Every evening | ORAL | Status: AC

## 2022-08-28 MED ORDER — TAMSULOSIN HCL 0.4 MG PO CAPS: 0.4000 mg | ORAL_CAPSULE | Freq: Every day | ORAL | 3 refills | Status: DC

## 2022-08-28 MED ORDER — OSMOLITE 1.5 CAL PO LIQD: 1000.0000 mL | ORAL | 0 refills | Status: DC

## 2022-08-28 MED ORDER — GUAIFENESIN 100 MG/5ML PO LIQD: 5.0000 mL | ORAL | Status: DC | PRN

## 2022-08-28 MED ORDER — IPRATROPIUM-ALBUTEROL 0.5-2.5 (3) MG/3ML IN SOLN: 3.0000 mL | RESPIRATORY_TRACT | Status: DC | PRN

## 2022-08-28 MED ORDER — FREE WATER
140.0000 mL | Freq: Every day | Status: DC
  Administered 2022-08-28 – 2022-08-29 (×3): 140 mL

## 2022-08-28 MED ORDER — AMLODIPINE BESYLATE 10 MG PO TABS: 10.0000 mg | ORAL_TABLET | Freq: Every day | ORAL | Status: AC

## 2022-08-28 MED ORDER — FREE WATER: 150.0000 mL | Status: DC

## 2022-08-28 MED ORDER — APIXABAN 5 MG PO TABS: 5.0000 mg | ORAL_TABLET | Freq: Two times a day (BID) | ORAL | Status: AC

## 2022-08-28 MED ORDER — PREGABALIN 50 MG PO CAPS: 50.0000 mg | ORAL_CAPSULE | Freq: Two times a day (BID) | ORAL | Status: DC

## 2022-08-28 MED ORDER — METOPROLOL TARTRATE 5 MG/5ML IV SOLN: 5.0000 mg | INTRAVENOUS | Status: DC | PRN

## 2022-08-28 MED ORDER — PROSOURCE TF20 ENFIT COMPATIBL EN LIQD: 60.0000 mL | Freq: Every day | ENTERAL | Status: DC

## 2022-08-28 MED ORDER — HYDRALAZINE HCL 20 MG/ML IJ SOLN: 10.0000 mg | INTRAMUSCULAR | Status: DC | PRN

## 2022-08-28 MED ORDER — SENNOSIDES-DOCUSATE SODIUM 8.6-50 MG PO TABS: 1.0000 | ORAL_TABLET | Freq: Every evening | ORAL | Status: DC | PRN

## 2022-08-28 MED ORDER — MIRTAZAPINE 7.5 MG PO TABS: 7.5000 mg | ORAL_TABLET | Freq: Every day | ORAL | Status: AC

## 2022-08-28 MED ORDER — POLYSACCHARIDE IRON COMPLEX 150 MG PO CAPS: 150.0000 mg | ORAL_CAPSULE | Freq: Every day | ORAL | 1 refills | Status: AC

## 2022-08-28 MED ORDER — OMEPRAZOLE 40 MG PO CPDR: 40.0000 mg | DELAYED_RELEASE_CAPSULE | Freq: Every day | ORAL | 3 refills | Status: DC

## 2022-08-28 NOTE — Progress Notes (Signed)
PEG tube medication administration demonstrated and education provided to pt and spouse. Verbalizes understanding.

## 2022-08-28 NOTE — Progress Notes (Signed)
Daily Progress Note   Patient Name: Evan COWLEY Sr.       Date: 08/28/2022 DOB: [birth year]  Age: 67 y.o. MRN#: [id number] Attending Physician: Damita Lack, MD Primary Care Physician: Jon Billings, NP Admit Date: 08/21/2022  Reason for Consultation/Follow-up: Establishing goals of care  Subjective: Notes and labs reviewed. In to see patient.  Wife and daughter are bedside.  They discuss that things are going well with tube feeds.  Patient began to cough.  Wife discusses that this has been occurring, and he has been coughing trying to clear his secretions.  She states she has been advised that he may be aspirating secretions.  Discussed what this means.  We discussed his diagnosis, GOC, EOL wishes disposition and options.  Created space and opportunity for patient  to explore thoughts and feelings regarding current medical information.  Wife states that he has told her that he would not want to be put on a ventilator or to have CPR.  She states she does not want to lose her husband, and so she is still considering this. She shares that she understands that as long as he is of sound mind his wishes would be honored.  She understands that he has not formally made changes to his CODE STATUS as he wanted further conversation with her first.  She states as long as he is able to make these decisions she will honor them. Inquired as to her thoughts for if patient is not able to make decisions for himself.  A detailed discussion was had today regarding advanced directives.  Concepts specific to code status, artifical feeding and hydration, IV antibiotics and rehospitalization were discussed.  The difference between an aggressive medical intervention path and a comfort care path was discussed.   Values and goals of care important to patient and family were attempted to be elicited.  Discussed limitations of medical interventions to prolong quality of life in some situations and discussed the concept of human mortality.  With conversation, wife states that it would be the hardest thing that she will ever have to do, she will uphold his wishes and provide advocacy based on his wishes.  She inquires about prognosis if he is aspirating secretions.  We discussed this.  She states that if he declines to a place of comfort focused  care with hospice she would want him to be at home with hospice.  She states she really does not want him to go to rehab, and wants him to go home and have home health PT.  I completed a MOST form today with patient wife and daughter.  The form was signed by wife due to patient's inability to sign secondary to history of strokes, and the signed original was placed in the chart. The form was scanned and sent to medical records for it to be uploaded under ACP tab in Epic. A photocopy was also placed in the chart to be scanned into EMR. The patient outlined their wishes for the following treatment decisions:  Cardiopulmonary Resuscitation: Do Not Attempt Resuscitation (DNR/No CPR)  Medical Interventions: Limited Additional Interventions: Use medical treatment, IV fluids and cardiac monitoring as indicated, DO NOT USE intubation or mechanical ventilation. May consider use of less invasive airway support such as BiPAP or CPAP. Also provide comfort measures. Transfer to the hospital if indicated. Avoid intensive care.   Antibiotics: Antibiotics if indicated  IV Fluids: IV fluids if indicated  Feeding Tube: Feeding tube long-term if indicated    Length of Stay: 6  Current Medications: Scheduled Meds:   amLODipine  10 mg Per Tube Daily   apixaban  5 mg Per Tube BID   atorvastatin  80 mg Per Tube QPM   feeding supplement (OSMOLITE 1.5 CAL)  237 mL Per Tube 6 X Daily   free  water  140 mL Per Tube 6 X Daily   lidocaine  1 patch Transdermal Q24H   mirtazapine  7.5 mg Per Tube Daily   mouth rinse  15 mL Mouth Rinse 4 times per day   pantoprazole (PROTONIX) IV  40 mg Intravenous Q24H   sodium chloride flush  3 mL Intravenous Q12H    Continuous Infusions:   PRN Meds: acetaminophen (TYLENOL) oral liquid 160 mg/5 mL **OR** acetaminophen, guaiFENesin, hydrALAZINE, ipratropium-albuterol, metoprolol tartrate, ondansetron **OR** ondansetron (ZOFRAN) IV, mouth rinse, oxyCODONE, senna-docusate  Physical Exam Pulmonary:     Effort: Pulmonary effort is normal.  Neurological:     Mental Status: He is alert.             Vital Signs: BP 108/69 (BP Location: Left Leg)   Pulse 88   Temp 98.5 F (36.9 C)   Resp 16   Ht 5\' 6"  (1.676 m)   Wt 86.9 kg   SpO2 97%   BMI 30.92 kg/m  SpO2: SpO2: 97 % O2 Device: O2 Device: Room Air O2 Flow Rate:    Intake/output summary:  Intake/Output Summary (Last 24 hours) at 08/28/2022 1512 Last data filed at 08/28/2022 1502 Gross per 24 hour  Intake 372.17 ml  Output 1000 ml  Net -627.83 ml   LBM: Last BM Date : 08/25/21 Baseline Weight: Weight: 64.8 kg Most recent weight: Weight: 86.9 kg    Patient Active Problem List   Diagnosis Date Noted   PEG (percutaneous endoscopic gastrostomy) status ([place]) 08/27/2022   Protein-calorie malnutrition, severe 08/26/2022   Dysphagia 08/25/2022   Septic shock ([place]) 08/24/2022   Other dysphagia 08/24/2022   AKI (acute kidney injury) ([place]) 08/24/2022   Aspiration pneumonia ([place]) 08/21/2022   Hypotension 08/21/2022   Acute kidney injury superimposed on chronic kidney disease ([place]) 06/23/2022   Hypoglycemia 06/22/2022   Stroke ([place]) 06/22/2022   Chronic diastolic CHF (congestive heart failure) ([place]) 06/22/2022   Iron deficiency anemia 06/22/2022   Chronic kidney disease, stage 3b ([place])  06/22/2022   Acute metabolic encephalopathy 06/22/2022   Type II diabetes mellitus with renal  manifestations (HCC) 06/22/2022   Acute ischemic stroke (HCC) 04/22/2022   Slurred speech    Weakness 04/21/2022   CKD stage 3 due to type 2 diabetes mellitus (HCC) 04/21/2022   Hypothermia 01/10/2022   Anemia due to stage 3b chronic kidney disease (HCC) 01/08/2022   Sinus bradycardia 01/07/2022   BPH (benign prostatic hyperplasia) 01/07/2022   Paroxysmal atrial fibrillation (HCC) 01/07/2022   Dyslipidemia 01/07/2022   Elevated liver enzymes 12/28/2021   Sinus pause    Bradycardia 10/16/2021   Hypertensive emergency without congestive heart failure 09/14/2021   Unstable angina (HCC)    Acute CVA (cerebrovascular accident) (HCC) 06/05/2021   Need for influenza vaccination 06/02/2021   Constipation 06/02/2021   Insomnia 06/02/2021   Gastroparesis 02/26/2021   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   Muscle spasm of left lower extremity 02/26/2021   History of stroke 02/26/2021   Diastolic dysfunction    Monitoring for anticoagulant use 06/27/2020   Lower extremity edema 05/16/2020   Atrial fibrillation, chronic (HCC) 10/26/2019   Coagulopathy (HCC) 06/12/2019   Gastroesophageal reflux disease    Hemiplegia and hemiparesis following cerebral infarction affecting left non-dominant side (HCC) 11/25/2018   RBBB 10/15/2018   Dysphagia, post-stroke    Microalbuminuria 07/29/2018   Hyperlipidemia LDL goal <70 02/11/2017   Recurrent strokes (HCC) 11/10/2016   Essential hypertension 11/10/2016   Uncontrolled type 2 diabetes mellitus with hyperglycemia, with long-term current use of insulin (HCC) 11/10/2016   Tobacco abuse 08/28/2013   Peripheral neuropathy 08/28/2013    Palliative Care Assessment & Plan    Recommendations/Plan: DNR/DNI.  Full scope otherwise. Recommend outpatient palliative to follow with transition to hospice care depending on how patient does.  Concern for aspiration of secretions.  Code Status:    Code Status Orders  (From admission, onward)            Start     Ordered   08/21/22 1725  Full code  Continuous       Question:  By:  Answer:  Consent: discussion documented in EHR   08/21/22 1727           Code Status History     Date Active Date Inactive Code Status Order ID Comments User Context   06/22/2022 1154 06/24/2022 1951 Full Code [id number]  Lorretta Harp, MD ED   04/21/2022 1313 04/23/2022 2118 Full Code [id number]  Lucile Shutters, MD ED   01/07/2022 0207 01/11/2022 2133 Full Code [id number]  Mansy, Vernetta Honey, MD ED   12/28/2021 1856 12/29/2021 1958 Full Code [id number]  Lynn Ito, MD ED   11/26/2021 1239 11/28/2021 1647 Full Code [id number]  Lynn Ito, MD ED   10/16/2021 1653 10/18/2021 1921 Full Code [id number]  Alford [place], MD ED   09/14/2021 1658 09/16/2021 1851 Full Code [id number]  Inez Catalina, MD Inpatient   09/20/2020 1411 09/21/2020 1626 Full Code [id number]  Lucile Shutters, MD ED   08/05/2020 2110 08/06/2020 1654 Full Code [id number]  Briscoe Deutscher, MD ED   09/05/2019 0005 09/06/2019 2125 Full Code [id number]  Anselm Jungling, DO ED   09/06/2018 1750 09/23/2018 1444 DNR [id number]  Tennis Must, RN Inpatient   09/06/2018 1324 09/06/2018 1750 Full Code [id number]  Charlton Amor, PA-C Inpatient   09/06/2018 1324 09/06/2018 1324 DNR [id number]  Charlton Amor, PA-C Inpatient   09/01/2018 1057 09/06/2018 1323 DNR [id number]  Ramonita Lab, MD ED  09/10/2017 1114 09/11/2017 1901 Full Code [id number]  [name], MD ED   11/10/2016 1447 11/11/2016 1853 Full Code [id number]  Katharina Caper, MD Inpatient       Prognosis:  Poor if aspiration secretions.    Thank you for allowing the Palliative Medicine Team to assist in the care of this patient.    Morton Stall, NP  Please contact Palliative Medicine Team phone at [phone] for questions and concerns.

## 2022-08-28 NOTE — Evaluation (Signed)
Occupational Therapy Evaluation Patient Details Name: Evan Townsend. MRN: [id number] DOB: Aug 30, 1955 Today's Date: 08/28/2022   History of Present Illness Evan Townsend Townsend. is a 67 y.o. male with medical history significant of recurrent strokes with residual left-sided deficits, known aspiration, HFpEF, CKD stage III, type 2 diabetes, bradycardia who presents to the ED due to generalized weakness. History obtained from patient and his wife as patient has dysarthria at baseline.   Clinical Impression   Mr Evan Townsend was seen for OT evaluation this date. Prior to hospital admission, pt was MOD I for mobility and ADLs. Pt lives with spouse. Pt presents to acute OT demonstrating impaired ADL performance and functional mobility 2/2 decreased activity tolerance and functional strength/balance deficits. Pt currently requires MOD A + HW bed>chair t/f. MIN A standing from chair, reports need for BM. MAX A pericare standing. Pt would benefit from skilled OT to address noted impairments and functional limitations (see below for any additional details). Upon hospital discharge, recommend STR to maximize pt safety and return to PLOF.    Recommendations for follow up therapy are one component of a multi-disciplinary discharge planning process, led by the attending physician.  Recommendations may be updated based on patient status, additional functional criteria and insurance authorization.   Follow Up Recommendations  Skilled nursing-short term rehab (<3 hours/day)     Assistance Recommended at Discharge Frequent or constant Supervision/Assistance  Patient can return home with the following A lot of help with walking and/or transfers;A lot of help with bathing/dressing/bathroom;Help with stairs or ramp for entrance    Functional Status Assessment  Patient has had a recent decline in their functional status and demonstrates the ability to make significant improvements in function in a reasonable and  predictable amount of time.  Equipment Recommendations  BSC/3in1    Recommendations for Other Services       Precautions / Restrictions Precautions Precautions: Fall Restrictions Weight Bearing Restrictions: No Other Position/Activity Restrictions: PEG tube      Mobility Bed Mobility Overal bed mobility: Needs Assistance Bed Mobility: Supine to Sit     Supine to sit: Mod assist     General bed mobility comments: assist for L side    Transfers Overall transfer level: Needs assistance Equipment used: 1 person hand held assist, Hemi-walker Transfers: Sit to/from Stand, Bed to chair/wheelchair/BSC Sit to Stand: Min assist     Step pivot transfers: Mod assist            Balance Overall balance assessment: Needs assistance Sitting-balance support: No upper extremity supported, Feet supported Sitting balance-Leahy Scale: Good     Standing balance support: Single extremity supported Standing balance-Leahy Scale: Fair                             ADL either performed or assessed with clinical judgement   ADL Overall ADL's : Needs assistance/impaired                                       General ADL Comments: MOD A + HW for simulated BSC t/f. MAX A pericare standing. SETUP phone use in sitting. MAX A don B socks seated EOB      Pertinent Vitals/Pain Pain Assessment Pain Assessment: No/denies pain     Hand Dominance     Extremity/Trunk Assessment Upper Extremity Assessment Upper Extremity Assessment: LUE deficits/detail  LUE Deficits / Details: Left sided weakness from prior stroke.   Lower Extremity Assessment Lower Extremity Assessment: LLE deficits/detail LLE Deficits / Details: Left sided weakness from prior stroke.       Communication Communication Communication: Expressive difficulties   Cognition Arousal/Alertness: Awake/alert Behavior During Therapy: WFL for tasks assessed/performed Overall Cognitive Status:  Within Functional Limits for tasks assessed                                                  Home Living Family/patient expects to be discharged to:: Private residence Living Arrangements: Spouse/significant other Available Help at Discharge: Family;Available 24 hours/day Type of Home: House Home Access: Ramped entrance;Stairs to enter     Home Layout: One level     Bathroom Shower/Tub: Teacher, early years/pre: Handicapped height     Home Equipment: Other (comment) (hemi walker)          Prior Functioning/Environment Prior Level of Function : Independent/Modified Independent             Mobility Comments: states he "gets all around" the house with HW ADLs Comments: assist for ADLs        OT Problem List: Decreased strength;Decreased range of motion;Decreased activity tolerance      OT Treatment/Interventions: Self-care/ADL training;Therapeutic exercise;DME and/or AE instruction;Energy conservation;Therapeutic activities;Balance training;Patient/family education    OT Goals(Current goals can be found in the care plan section) Acute Rehab OT Goals Patient Stated Goal: to go home OT Goal Formulation: With patient Time For Goal Achievement: 09/11/22 Potential to Achieve Goals: Good ADL Goals Pt Will Perform Lower Body Dressing: with min assist;with caregiver independent in assisting;sit to/from stand Pt Will Transfer to Toilet: with min assist;ambulating;bedside commode  OT Frequency: Min 2X/week    Co-evaluation              AM-PAC OT "6 Clicks" Daily Activity     Outcome Measure Help from another person eating meals?: A Little Help from another person taking care of personal grooming?: A Little Help from another person toileting, which includes using toliet, bedpan, or urinal?: A Lot Help from another person bathing (including washing, rinsing, drying)?: A Lot Help from another person to put on and taking off regular upper  body clothing?: A Little Help from another person to put on and taking off regular lower body clothing?: A Lot 6 Click Score: 15   End of Session    Activity Tolerance: Patient tolerated treatment well Patient left: in chair;with call bell/phone within reach;with chair alarm set  OT Visit Diagnosis: Other abnormalities of gait and mobility (R26.89);Muscle weakness (generalized) (M62.81)                Time: 2951-8841 OT Time Calculation (min): 27 min Charges:  OT General Charges $OT Visit: 1 Visit OT Evaluation $OT Eval Moderate Complexity: 1 Mod OT Treatments $Self Care/Home Management : 8-22 mins  Dessie Coma, M.S. OTR/L  08/28/22, 12:11 PM  ascom [phone]

## 2022-08-28 NOTE — Progress Notes (Signed)
Recs changed to SNF for pt.  CSW called and lvm for pts wife, Rise Paganini to return the call to discuss.

## 2022-08-28 NOTE — Progress Notes (Signed)
Spouse demonstrated PEG feeding and flush.

## 2022-08-28 NOTE — Progress Notes (Signed)
Nutrition Follow-up  DOCUMENTATION CODES:   Severe malnutrition in context of chronic illness  INTERVENTION:   -Transition to bolus feedings:   237 ml Osmolite 1.5 6 times daily   70 ml free water flush before and after each feeding administration   Tube feeding regimen provides 2130 kcal (100% of needs), 89 grams of protein, and 1140 ml of H2O. Total free water: 1986 ml daily  NUTRITION DIAGNOSIS:   Severe Malnutrition related to chronic illness (CVA) as evidenced by percent weight loss, moderate fat depletion, severe fat depletion, moderate muscle depletion, severe muscle depletion.  Ongoing  GOAL:   Patient will meet greater than or equal to 90% of their needs  Progressing   MONITOR:   TF tolerance  REASON FOR ASSESSMENT:   Low [place]    ASSESSMENT:   Pt with medical history significant of recurrent strokes with residual left-sided deficits, known aspiration, HFpEF, CKD stage III, type 2 diabetes, bradycardia who presents due to generalized weakness.  1/10- s/p surgical g-tube placement   Reviewed I/O's: -1.1 L x 24 hours and -6.8 L since admission  Case discussed with RN, TOC, and MD. Pt is preparing for discharge home on TF. TF were just started yesterday and pt tolerating well. RD will transition pt to bolus feeds to help prepare for discharge. RN to provide TF education to wife.   Labs reviewed: K, Mg, and Phso WDL. CBGS: 152-178 (inpatient orders for glycemic control are none).     Diet Order:   Diet Order             Diet NPO time specified  Diet effective now                   EDUCATION NEEDS:   No education needs have been identified at this time  Skin:  Skin Assessment: Reviewed RN Assessment  Last BM:  08/25/22  Height:   Ht Readings from Last 1 Encounters:  08/26/22 5\' 6"  (1.676 m)    Weight:   Wt Readings from Last 1 Encounters:  08/28/22 86.9 kg    Ideal Body Weight:  64.5 kg  BMI:  Body mass index is 30.92  kg/m.  Estimated Nutritional Needs:   Kcal:  1900-2100  Protein:  95-110 grams  Fluid:  > 1.9 L    Loistine Chance, RD, LDN, [place] Registered Dietitian II Certified Diabetes Care and Education Specialist Please refer to [facility] for RD and/or RD on-call/weekend/after hours pager

## 2022-08-28 NOTE — Progress Notes (Signed)
Physical Therapy Treatment Patient Details Name: Evan BOHLKEN Sr. MRN: [id number] DOB: 10-01-1955 Today's Date: 08/28/2022   History of Present Illness Evan BLYDEN Sr. is a 68 y.o. male with medical history significant of recurrent strokes with residual left-sided deficits, known aspiration, HFpEF, CKD stage III, type 2 diabetes, bradycardia who presents to the ED due to generalized weakness. History obtained from patient and his wife as patient has dysarthria at baseline.    PT Comments    Pt was pleasant and motivated to participate during the session and put forth good effort throughout. Pt presented with significant functional weakness compared to baseline and required heavy +2 assist for transfers and gait.  Pt unsteady in standing requiring constant physical assist to prevent LOB during static standing training and then heavy +2 assist to take several small, shuffling steps to the bed.  Pt is at a very high fall risk and would not be safe to return to his prior living situation at this time.  Pt will benefit from PT services in a SNF setting upon discharge to safely address deficits listed in patient problem list for decreased caregiver assistance and eventual return to PLOF.     Recommendations for follow up therapy are one component of a multi-disciplinary discharge planning process, led by the attending physician.  Recommendations may be updated based on patient status, additional functional criteria and insurance authorization.  Follow Up Recommendations  Skilled nursing-short term rehab (<3 hours/day) Can patient physically be transported by private vehicle: No   Assistance Recommended at Discharge Frequent or constant Supervision/Assistance  Patient can return home with the following Assistance with cooking/housework;Two people to help with walking and/or transfers;A lot of help with bathing/dressing/bathroom;Assist for transportation;Help with stairs or ramp for entrance    Equipment Recommendations  None recommended by PT    Recommendations for Other Services       Precautions / Restrictions Precautions Precautions: Fall Restrictions Weight Bearing Restrictions: No Other Position/Activity Restrictions: PEG tube     Mobility  Bed Mobility Overal bed mobility: Needs Assistance Bed Mobility: Sit to Supine     Supine to sit: Mod assist, +2 for physical assistance     General bed mobility comments: +2 Mod A for BLE and trunk control    Transfers Overall transfer level: Needs assistance Equipment used: Hemi-walker Transfers: Sit to/from Stand Sit to Stand: Mod assist, +2 physical assistance           General transfer comment: Multiple sit to/from stands from various hight surfaces with +2 Mod A and and heavy verbal cuing for sequencing needed; pt unable to come to full upright standing with trunk and B knees flexed throughout    Ambulation/Gait Ambulation/Gait assistance: Mod assist   Assistive device: Hemi-walker Gait Pattern/deviations: Step-to pattern, Trunk flexed, Decreased step length - right, Decreased step length - left, Shuffle Gait velocity: decreased     General Gait Details: Pt able to take a max of 2-3 very effortful, shuffling steps from chair to bed with +2 mod A for stability   Stairs             Wheelchair Mobility    Modified Rankin (Stroke Patients Only)       Balance Overall balance assessment: Needs assistance Sitting-balance support: Feet supported, Single extremity supported Sitting balance-Leahy Scale: Fair     Standing balance support: Single extremity supported, During functional activity Standing balance-Leahy Scale: Poor  Cognition Arousal/Alertness: Awake/alert Behavior During Therapy: WFL for tasks assessed/performed Overall Cognitive Status: Within Functional Limits for tasks assessed                                           Exercises      General Comments        Pertinent Vitals/Pain Pain Assessment Pain Assessment: No/denies pain    Home Living Family/patient expects to be discharged to:: Private residence Living Arrangements: Spouse/significant other Available Help at Discharge: Family;Available 24 hours/day Type of Home: House Home Access: Ramped entrance;Stairs to enter       Home Layout: One level Home Equipment: Other (comment) (hemi walker)      Prior Function            PT Goals (current goals can now be found in the care plan section) Progress towards PT goals: PT to reassess next treatment    Frequency    Min 2X/week      PT Plan Discharge plan needs to be updated    Co-evaluation              AM-PAC PT "6 Clicks" Mobility   Outcome Measure  Help needed turning from your back to your side while in a flat bed without using bedrails?: A Lot Help needed moving from lying on your back to sitting on the side of a flat bed without using bedrails?: A Lot Help needed moving to and from a bed to a chair (including a wheelchair)?: Total Help needed standing up from a chair using your arms (e.g., wheelchair or bedside chair)?: Total Help needed to walk in hospital room?: Total Help needed climbing 3-5 steps with a railing? : Total 6 Click Score: 8    End of Session Equipment Utilized During Treatment: Gait belt;Other (comment) (Gait belt under arms to avoid PEG tube) Activity Tolerance: Patient tolerated treatment well Patient left: in bed;with call bell/phone within reach;with bed alarm set;with nursing/sitter in room;with family/visitor present Nurse Communication: Mobility status PT Visit Diagnosis: Unsteadiness on feet (R26.81);Other abnormalities of gait and mobility (R26.89);Muscle weakness (generalized) (M62.81);History of falling (Z91.81);Difficulty in walking, not elsewhere classified (R26.2);Adult, failure to thrive (R62.7)     Time: 6213-0865 PT Time  Calculation (min) (ACUTE ONLY): 18 min  Charges:  $Therapeutic Activity: 8-22 mins                    D. Scott Karia Ehresman PT, DPT 08/28/22, 12:07 PM

## 2022-08-28 NOTE — Care Management Important Message (Signed)
Important Message  Patient Details  Name: Evan MONICAL Sr. MRN: [id number] Date of Birth: Jun 21, 1956   Medicare Important Message Given:  Yes     Juliann Pulse A Sharolyn Weber 08/28/2022, 11:18 AM

## 2022-08-28 NOTE — Progress Notes (Signed)
PROGRESS NOTE    Evan JEFFERYS Sr.  FIE:[id number] DOB: 02-25-1956 DOA: 08/21/2022 PCP: Larae Grooms, NP   Brief Narrative:  67 y.o. male with medical history significant of recurrent strokes with residual left-sided deficits, known aspiration, HFpEF, CKD stage III, type 2 diabetes, bradycardia who presents to the ED due to generalized weakness. History obtained from patient and his wife as patient has dysarthria at baseline.  At home patient has had generalized weakness and poor oral intake.  Upon admission he was evaluated by speech and swallow and was at very high risk of aspiration.  General surgery was consulted for PEG tube placement which was placed on 1/10.  Nutrition was consulted.  During this time patient was also diagnosed with aspiration pneumonia therefore was placed on Unasyn.   Assessment & Plan:  Principal Problem:   Aspiration pneumonia (HCC) Active Problems:   Acute kidney injury superimposed on chronic kidney disease (HCC)   Hypotension   Recurrent strokes (HCC)   Uncontrolled type 2 diabetes mellitus with hyperglycemia, with long-term current use of insulin (HCC)   Sinus bradycardia   Septic shock (HCC)   Other dysphagia   AKI (acute kidney injury) (HCC)   Dysphagia   Protein-calorie malnutrition, severe   PEG (percutaneous endoscopic gastrostomy) status (HCC)     Assessment and Plan: * Aspiration pneumonia (HCC) Severe protein calorie malnutrition Unfortunately has had severe aspiration, evaluated by speech and swallow eval.  Patient had PEG tube placed by general surgery on 1/10.  Currently getting continuous tube feeds, I requested dietitian to transition to bolus feeding Completed course of IV Unasyn Supportive care.  Monitor for refeeding syndrome.  And repeat electrolytes as needed  Acute kidney injury superimposed on chronic kidney disease (HCC) Baseline creatinine 1.6, creatinine 2.11  Essential hypertension Norvasc, hydralazine.  IV as  needed  Recurrent strokes (HCC) Continue home Eliquis, statin  Uncontrolled type 2 diabetes mellitus with hyperglycemia, with long-term current use of insulin (HCC) - Hold home Farxiga - SSI, sensitive  Sinus bradycardia History of second-degree AV block.  Previously negative workup, follow-up outpatient  Seen by palliative care service.  Wishes to remain full code  DVT prophylaxis: Eliquis Code Status: Full code Family Communication:    Status is: Inpatient Remains inpatient appropriate because: Patient is medically doing well.  Currently awaiting PT/OT evaluation, home health arrangements, bolus feeding and flushing education to his wife.  Hopefully discharge once all of this is achieved.   Nutritional status    Signs/Symptoms: percent weight loss, moderate fat depletion, severe fat depletion, moderate muscle depletion, severe muscle depletion  Interventions: Refer to RD note for recommendations  Body mass index is 30.92 kg/m.         Subjective: Seen and Ament at bedside, overall very weak.  Sitting in the recliner.  No complaints.   Examination:  General exam: Appears calm and comfortable  Respiratory system: Clear to auscultation. Respiratory effort normal. Cardiovascular system: S1 & S2 heard, RRR. No JVD, murmurs, rubs, gallops or clicks. No pedal edema. Gastrointestinal system: Abdomen is nondistended, soft and nontender. No organomegaly or masses felt. Normal bowel sounds heard. Central nervous system: Alert and oriented. No focal neurological deficits. Extremities: Left upper and lower extremity strength 3/5, right upper and lower extremity strength 4+/5. Skin: No rashes, lesions or ulcers Psychiatry: Judgement and insight appear normal. Mood & affect appropriate.   PEG tube in place  Objective: Vitals:   08/27/22 2246 08/28/22 0500 08/28/22 0550 08/28/22 0730  BP: (!) 144/67  138/81 108/69  Pulse: 70  95 88  Resp:   20 16  Temp:   98.2 F (36.8  C) 98.5 F (36.9 C)  TempSrc:      SpO2:   98% 97%  Weight:  86.9 kg    Height:        Intake/Output Summary (Last 24 hours) at 08/28/2022 0748 Last data filed at 08/27/2022 1710 Gross per 24 hour  Intake --  Output 1100 ml  Net -1100 ml   Filed Weights   08/26/22 1536 08/27/22 0504 08/28/22 0500  Weight: 64.8 kg 64.2 kg 86.9 kg     Data Reviewed:   CBC: Recent Labs  Lab 08/21/22 1324 08/22/22 1650 08/23/22 0342 08/24/22 0239 08/25/22 0454 08/27/22 0515 08/28/22 0543  WBC 3.2*   < > 3.8* 4.7 5.2 6.0 6.8  NEUTROABS 2.4  --   --   --   --   --   --   HGB 9.2*   < > 8.4* 8.5* 8.9* 8.2* 8.2*  HCT 30.9*   < > 27.0* 27.5* 28.2* 27.0* 26.9*  MCV 88.3   < > 87.4 87.0 86.0 87.1 87.6  PLT 143*   < > 136* 120* 145* 117* 144*   < > = values in this interval not displayed.   Basic Metabolic Panel: Recent Labs  Lab 08/24/22 0239 08/25/22 0454 08/26/22 0357 08/26/22 1404 08/27/22 0515 08/27/22 2101 08/28/22 0543  NA 145 144 146*  --  145  --  145  K 4.0 4.0 4.0  --  4.1  --  4.3  CL 116* 115* 117*  --  116*  --  115*  CO2 20* 22 22  --  19*  --  22  GLUCOSE 66* 93 130*  --  69*  --  161*  BUN 21 17 14   --  14  --  24*  CREATININE 2.00* 1.96* 1.93*  --  1.95*  --  2.11*  CALCIUM 8.7* 9.0 8.9  --  8.7*  --  8.4*  MG  --   --  1.7 1.8 1.8 1.8  --   PHOS  --   --  3.7 3.9 3.6 3.6  --    GFR: Estimated Creatinine Clearance: 36 mL/min (A) (by C-G formula based on SCr of 2.11 mg/dL (H)). Liver Function Tests: Recent Labs  Lab 08/21/22 1324 08/23/22 0342 08/24/22 0239  AST 41 84* 86*  ALT 68* 96* 102*  ALKPHOS 172* 171* 172*  BILITOT 0.5 0.6 0.7  PROT 8.1 7.1 7.0  ALBUMIN 3.2* 2.7* 2.6*   No results for input(s): "LIPASE", "AMYLASE" in the last 168 hours. No results for input(s): "AMMONIA" in the last 168 hours. Coagulation Profile: Recent Labs  Lab 08/21/22 1324  INR 1.6*   Cardiac Enzymes: No results for input(s): "CKTOTAL", "CKMB", "CKMBINDEX",  "TROPONINI" in the last 168 hours. BNP (last 3 results) No results for input(s): "PROBNP" in the last 8760 hours. HbA1C: No results for input(s): "HGBA1C" in the last 72 hours. CBG: Recent Labs  Lab 08/27/22 0756 08/27/22 1127 08/27/22 1607 08/27/22 2113 08/28/22 0523  GLUCAP 78 69* 162* 152* 171*   Lipid Profile: No results for input(s): "CHOL", "HDL", "LDLCALC", "TRIG", "CHOLHDL", "LDLDIRECT" in the last 72 hours. Thyroid Function Tests: No results for input(s): "TSH", "T4TOTAL", "FREET4", "T3FREE", "THYROIDAB" in the last 72 hours. Anemia Panel: No results for input(s): "VITAMINB12", "FOLATE", "FERRITIN", "TIBC", "IRON", "RETICCTPCT" in the last 72 hours. Sepsis Labs: Recent Labs  Lab  08/21/22 1324 08/21/22 1547  PROCALCITON 0.20  --   LATICACIDVEN 1.2 0.5    Recent Results (from the past 240 hour(s))  Culture, blood (Routine x 2)     Status: None   Collection Time: 08/21/22  1:24 PM   Specimen: BLOOD  Result Value Ref Range Status   Specimen Description BLOOD BLOOD LEFT HAND  Final   Special Requests   Final    BOTTLES DRAWN AEROBIC AND ANAEROBIC Blood Culture adequate volume   Culture   Final    NO GROWTH 5 DAYS Performed at [facility], [address] [place], [place] [address]    Report Status 08/26/2022 FINAL  Final  Culture, blood (Routine x 2)     Status: None   Collection Time: 08/21/22  1:24 PM   Specimen: BLOOD  Result Value Ref Range Status   Specimen Description BLOOD BLOOD LEFT HAND  Final   Special Requests   Final    BOTTLES DRAWN AEROBIC AND ANAEROBIC Blood Culture results may not be optimal due to an inadequate volume of blood received in culture bottles   Culture   Final    NO GROWTH 5 DAYS Performed at [facility], [address] [place], [place] [address]    Report Status 08/26/2022 FINAL  Final  Resp panel by RT-PCR (RSV, Flu A&B, Covid) Anterior Nasal Swab     Status: None   Collection Time: 08/21/22  3:47 PM    Specimen: Anterior Nasal Swab  Result Value Ref Range Status   SARS Coronavirus 2 by RT PCR NEGATIVE NEGATIVE Final    Comment: (NOTE) SARS-CoV-2 target nucleic acids are NOT DETECTED.  The SARS-CoV-2 RNA is generally detectable in upper respiratory specimens during the acute phase of infection. The lowest concentration of SARS-CoV-2 viral copies this assay can detect is 138 copies/mL. A negative result does not preclude SARS-Cov-2 infection and should not be used as the sole basis for treatment or other patient management decisions. A negative result may occur with  improper specimen collection/handling, submission of specimen other than nasopharyngeal swab, presence of viral mutation(s) within the areas targeted by this assay, and inadequate number of viral copies(<138 copies/mL). A negative result must be combined with clinical observations, patient history, and epidemiological information. The expected result is Negative.  Fact Sheet for Patients:  EntrepreneurPulse.com.au  Fact Sheet for Healthcare Providers:  IncredibleEmployment.be  This test is no t yet approved or cleared by the [place] FDA and  has been authorized for detection and/or diagnosis of SARS-CoV-2 by FDA under an Emergency Use Authorization (EUA). This EUA will remain  in effect (meaning this test can be used) for the duration of the COVID-19 declaration under Section 564(b)(1) of the Act, 21 U.S.C.section 360bbb-3(b)(1), unless the authorization is terminated  or revoked sooner.       Influenza A by PCR NEGATIVE NEGATIVE Final   Influenza B by PCR NEGATIVE NEGATIVE Final    Comment: (NOTE) The Xpert Xpress SARS-CoV-2/FLU/RSV plus assay is intended as an aid in the diagnosis of influenza from Nasopharyngeal swab specimens and should not be used as a sole basis for treatment. Nasal washings and aspirates are unacceptable for Xpert Xpress  SARS-CoV-2/FLU/RSV testing.  Fact Sheet for Patients: EntrepreneurPulse.com.au  Fact Sheet for Healthcare Providers: IncredibleEmployment.be  This test is not yet approved or cleared by the [place] FDA and has been authorized for detection and/or diagnosis of SARS-CoV-2 by FDA under an Emergency Use Authorization (EUA). This EUA will  remain in effect (meaning this test can be used) for the duration of the COVID-19 declaration under Section 564(b)(1) of the Act, 21 U.S.C. section 360bbb-3(b)(1), unless the authorization is terminated or revoked.     Resp Syncytial Virus by PCR NEGATIVE NEGATIVE Final    Comment: (NOTE) Fact Sheet for Patients: EntrepreneurPulse.com.au  Fact Sheet for Healthcare Providers: IncredibleEmployment.be  This test is not yet approved or cleared by the [place] FDA and has been authorized for detection and/or diagnosis of SARS-CoV-2 by FDA under an Emergency Use Authorization (EUA). This EUA will remain in effect (meaning this test can be used) for the duration of the COVID-19 declaration under Section 564(b)(1) of the Act, 21 U.S.C. section 360bbb-3(b)(1), unless the authorization is terminated or revoked.  Performed at [facility], [address] [place], [place] [address]          Radiology Studies: No results found.      Scheduled Meds:  amLODipine  10 mg Per Tube Daily   apixaban  5 mg Per Tube BID   atorvastatin  80 mg Per Tube QPM   feeding supplement (PROSource TF20)  60 mL Per Tube Daily   free water  150 mL Per Tube Q4H   hydrALAZINE  20 mg Intravenous Q8H   lidocaine  1 patch Transdermal Q24H   mirtazapine  7.5 mg Per Tube Daily   mouth rinse  15 mL Mouth Rinse 4 times per day   pantoprazole (PROTONIX) IV  40 mg Intravenous Q24H   sodium chloride flush  3 mL Intravenous Q12H   Continuous Infusions:  feeding supplement (OSMOLITE  1.5 CAL) 1,000 mL (08/28/22 0513)     LOS: 6 days   Time spent= 35 mins    Eyvonne Burchfield Arsenio Loader, MD [facility]  If 7PM-7AM, please contact night-coverage  08/28/2022, 7:48 AM

## 2022-08-29 DIAGNOSIS — J690 Pneumonitis due to inhalation of food and vomit: Secondary | ICD-10-CM | POA: Diagnosis not present

## 2022-08-29 LAB — CBC
HCT: 24.3 % — ABNORMAL LOW (ref 39.0–52.0)
Hemoglobin: 7.4 g/dL — ABNORMAL LOW (ref 13.0–17.0)
MCH: 26.7 pg (ref 26.0–34.0)
MCHC: 30.5 g/dL (ref 30.0–36.0)
MCV: 87.7 fL (ref 80.0–100.0)
Platelets: 153 10*3/uL (ref 150–400)
RBC: 2.77 MIL/uL — ABNORMAL LOW (ref 4.22–5.81)
RDW: 18.7 % — ABNORMAL HIGH (ref 11.5–15.5)
WBC: 8.5 10*3/uL (ref 4.0–10.5)
nRBC: 0 % (ref 0.0–0.2)

## 2022-08-29 LAB — GLUCOSE, CAPILLARY
Glucose-Capillary: 228 mg/dL — ABNORMAL HIGH (ref 70–99)
Glucose-Capillary: 213 mg/dL — ABNORMAL HIGH (ref 70–99)

## 2022-08-29 LAB — BASIC METABOLIC PANEL
Anion gap: 8 (ref 5–15)
BUN: 29 mg/dL — ABNORMAL HIGH (ref 8–23)
CO2: 23 mmol/L (ref 22–32)
Calcium: 8.5 mg/dL — ABNORMAL LOW (ref 8.9–10.3)
Chloride: 116 mmol/L — ABNORMAL HIGH (ref 98–111)
Creatinine, Ser: 2.26 mg/dL — ABNORMAL HIGH (ref 0.61–1.24)
GFR, Estimated: 31 mL/min — ABNORMAL LOW (ref >60)
Glucose, Bld: 83 mg/dL (ref 70–99)
Potassium: 4.2 mmol/L (ref 3.5–5.1)
Sodium: 147 mmol/L — ABNORMAL HIGH (ref 135–145)

## 2022-08-29 LAB — MAGNESIUM: Magnesium: 2.2 mg/dL (ref 1.7–2.4)

## 2022-08-29 MED ORDER — FREE WATER: 200.0000 mL | Status: AC

## 2022-08-29 MED ORDER — FREE WATER
200.0000 mL | Status: DC
  Administered 2022-08-29 – 2022-08-31 (×10): 200 mL

## 2022-08-29 NOTE — Progress Notes (Signed)
PROGRESS NOTE    Evan DIBIASIO Sr.  BPZ:[id number] DOB: March 12, 1956 DOA: 08/21/2022 PCP: Larae Grooms, NP   Brief Narrative:  67 y.o. male with medical history significant of recurrent strokes with residual left-sided deficits, known aspiration, HFpEF, CKD stage III, type 2 diabetes, bradycardia who presents to the ED due to generalized weakness. History obtained from patient and his wife as patient has dysarthria at baseline.  At home patient has had generalized weakness and poor oral intake.  Upon admission he was evaluated by speech and swallow and was at very high risk of aspiration.  General surgery was consulted for PEG tube placement which was placed on 1/10.  Nutrition was consulted.  During this time patient was also diagnosed with aspiration pneumonia therefore was placed on Unasyn.  Upon repeat physical therapy evaluation, it was determined patient would benefit from SNF placement.   Assessment & Plan:  Principal Problem:   Aspiration pneumonia (HCC) Active Problems:   Acute kidney injury superimposed on chronic kidney disease (HCC)   Hypotension   Recurrent strokes (HCC)   Uncontrolled type 2 diabetes mellitus with hyperglycemia, with long-term current use of insulin (HCC)   Sinus bradycardia   Septic shock (HCC)   Other dysphagia   AKI (acute kidney injury) (HCC)   Dysphagia   Protein-calorie malnutrition, severe   PEG (percutaneous endoscopic gastrostomy) status (HCC)     Assessment and Plan: * Aspiration pneumonia (HCC) Severe protein calorie malnutrition Unfortunately has had severe aspiration, evaluated by speech and swallow eval.  Patient had PEG tube placed by general surgery on 1/10.  Bolus feeding. Completed course of Unasyn. Supportive care.  Monitor for refeeding syndrome.  And repeat electrolytes as needed  Acute kidney injury superimposed on chronic kidney disease (HCC) Hyponatremia Baseline creatinine 1.6, creatinine 2.11 > 2.26.  Increase free  water flushes  Essential hypertension Norvasc, hydralazine.  IV as needed  Recurrent strokes (HCC) Continue home Eliquis, statin  Uncontrolled type 2 diabetes mellitus with hyperglycemia, with long-term current use of insulin (HCC) - Hold home [place].  Blood sugar in good range - SSI, sensitive  Anemia of chronic disease - Baseline hemoglobin 9.0, slowly trended down to 7.4 this morning.  Transfuse if Hb < 7  Sinus bradycardia History of second-degree AV block.  Previously negative workup, follow-up outpatient  Seen by palliative care service.  Wishes to remain full code  DVT prophylaxis: Eliquis Code Status: Full code Family Communication:    Status is: Inpatient Remains inpatient appropriate because: Currently awaiting SNF placement  Nutritional status   Signs/Symptoms: percent weight loss, moderate fat depletion, severe fat depletion, moderate muscle depletion, severe muscle depletion  Interventions: Refer to RD note for recommendations  Body mass index is 30.92 kg/m.      Subjective: Laying in the bed, no complaints  Examination: Constitutional: Not in acute distress Respiratory: Clear to auscultation bilaterally Cardiovascular: Normal sinus rhythm, no rubs Abdomen: Nontender nondistended good bowel sounds Musculoskeletal: No edema noted Skin: No rashes seen Neurologic left upper and lower extremity strength 3/5, right upper and lower extremity strength 4+/5 Psychiatric: Normal judgment and insight. Alert and oriented x 3. Normal mood.  PEG tube in place  Objective: Vitals:   08/28/22 0730 08/28/22 1627 08/28/22 2200 08/29/22 0641  BP: 108/69 (!) 131/92 128/84 (!) 141/75  Pulse: 88 (!) 103 100 85  Resp: 16 18 (!) 21 18  Temp: 98.5 F (36.9 C) 98.8 F (37.1 C) 98 F (36.7 C) 98.8 F (37.1 C)  TempSrc:  Axillary Oral  SpO2: 97% 100% 97% 98%  Weight:      Height:        Intake/Output Summary (Last 24 hours) at 08/29/2022 1610 Last data filed  at 08/29/2022 9604 Gross per 24 hour  Intake 372.17 ml  Output 1900 ml  Net -1527.83 ml   Filed Weights   08/26/22 1536 08/27/22 0504 08/28/22 0500  Weight: 64.8 kg 64.2 kg 86.9 kg     Data Reviewed:   CBC: Recent Labs  Lab 08/24/22 0239 08/25/22 0454 08/27/22 0515 08/28/22 0543 08/29/22 0621  WBC 4.7 5.2 6.0 6.8 8.5  HGB 8.5* 8.9* 8.2* 8.2* 7.4*  HCT 27.5* 28.2* 27.0* 26.9* 24.3*  MCV 87.0 86.0 87.1 87.6 87.7  PLT 120* 145* 117* 144* 540   Basic Metabolic Panel: Recent Labs  Lab 08/25/22 0454 08/26/22 0357 08/26/22 1404 08/27/22 0515 08/27/22 2101 08/28/22 0543 08/29/22 0621  NA 144 146*  --  145  --  145 147*  K 4.0 4.0  --  4.1  --  4.3 4.2  CL 115* 117*  --  116*  --  115* 116*  CO2 22 22  --  19*  --  22 23  GLUCOSE 93 130*  --  69*  --  161* 83  BUN 17 14  --  14  --  24* 29*  CREATININE 1.96* 1.93*  --  1.95*  --  2.11* 2.26*  CALCIUM 9.0 8.9  --  8.7*  --  8.4* 8.5*  MG  --  1.7 1.8 1.8 1.8  --  2.2  PHOS  --  3.7 3.9 3.6 3.6  --   --    GFR: Estimated Creatinine Clearance: 33.6 mL/min (A) (by C-G formula based on SCr of 2.26 mg/dL (H)). Liver Function Tests: Recent Labs  Lab 08/23/22 0342 08/24/22 0239  AST 84* 86*  ALT 96* 102*  ALKPHOS 171* 172*  BILITOT 0.6 0.7  PROT 7.1 7.0  ALBUMIN 2.7* 2.6*   No results for input(s): "LIPASE", "AMYLASE" in the last 168 hours. No results for input(s): "AMMONIA" in the last 168 hours. Coagulation Profile: No results for input(s): "INR", "PROTIME" in the last 168 hours.  Cardiac Enzymes: No results for input(s): "CKTOTAL", "CKMB", "CKMBINDEX", "TROPONINI" in the last 168 hours. BNP (last 3 results) No results for input(s): "PROBNP" in the last 8760 hours. HbA1C: No results for input(s): "HGBA1C" in the last 72 hours. CBG: Recent Labs  Lab 08/28/22 0523 08/28/22 0801 08/28/22 1135 08/28/22 1621 08/28/22 2140  GLUCAP 171* 178* 129* 126* 131*   Lipid Profile: No results for input(s): "CHOL",  "HDL", "LDLCALC", "TRIG", "CHOLHDL", "LDLDIRECT" in the last 72 hours. Thyroid Function Tests: No results for input(s): "TSH", "T4TOTAL", "FREET4", "T3FREE", "THYROIDAB" in the last 72 hours. Anemia Panel: No results for input(s): "VITAMINB12", "FOLATE", "FERRITIN", "TIBC", "IRON", "RETICCTPCT" in the last 72 hours. Sepsis Labs: No results for input(s): "PROCALCITON", "LATICACIDVEN" in the last 168 hours.   Recent Results (from the past 240 hour(s))  Culture, blood (Routine x 2)     Status: None   Collection Time: 08/21/22  1:24 PM   Specimen: BLOOD  Result Value Ref Range Status   Specimen Description BLOOD BLOOD LEFT HAND  Final   Special Requests   Final    BOTTLES DRAWN AEROBIC AND ANAEROBIC Blood Culture adequate volume   Culture   Final    NO GROWTH 5 DAYS Performed at [facility], [address], [place], [place] [address]  Report Status 08/26/2022 FINAL  Final  Culture, blood (Routine x 2)     Status: None   Collection Time: 08/21/22  1:24 PM   Specimen: BLOOD  Result Value Ref Range Status   Specimen Description BLOOD BLOOD LEFT HAND  Final   Special Requests   Final    BOTTLES DRAWN AEROBIC AND ANAEROBIC Blood Culture results may not be optimal due to an inadequate volume of blood received in culture bottles   Culture   Final    NO GROWTH 5 DAYS Performed at [facility], [address] [place], [place] [address]    Report Status 08/26/2022 FINAL  Final  Resp panel by RT-PCR (RSV, Flu A&B, Covid) Anterior Nasal Swab     Status: None   Collection Time: 08/21/22  3:47 PM   Specimen: Anterior Nasal Swab  Result Value Ref Range Status   SARS Coronavirus 2 by RT PCR NEGATIVE NEGATIVE Final    Comment: (NOTE) SARS-CoV-2 target nucleic acids are NOT DETECTED.  The SARS-CoV-2 RNA is generally detectable in upper respiratory specimens during the acute phase of infection. The lowest concentration of SARS-CoV-2 viral copies this assay can detect  is 138 copies/mL. A negative result does not preclude SARS-Cov-2 infection and should not be used as the sole basis for treatment or other patient management decisions. A negative result may occur with  improper specimen collection/handling, submission of specimen other than nasopharyngeal swab, presence of viral mutation(s) within the areas targeted by this assay, and inadequate number of viral copies(<138 copies/mL). A negative result must be combined with clinical observations, patient history, and epidemiological information. The expected result is Negative.  Fact Sheet for Patients:  EntrepreneurPulse.com.au  Fact Sheet for Healthcare Providers:  IncredibleEmployment.be  This test is no t yet approved or cleared by the [place] FDA and  has been authorized for detection and/or diagnosis of SARS-CoV-2 by FDA under an Emergency Use Authorization (EUA). This EUA will remain  in effect (meaning this test can be used) for the duration of the COVID-19 declaration under Section 564(b)(1) of the Act, 21 U.S.C.section 360bbb-3(b)(1), unless the authorization is terminated  or revoked sooner.       Influenza A by PCR NEGATIVE NEGATIVE Final   Influenza B by PCR NEGATIVE NEGATIVE Final    Comment: (NOTE) The Xpert Xpress SARS-CoV-2/FLU/RSV plus assay is intended as an aid in the diagnosis of influenza from Nasopharyngeal swab specimens and should not be used as a sole basis for treatment. Nasal washings and aspirates are unacceptable for Xpert Xpress SARS-CoV-2/FLU/RSV testing.  Fact Sheet for Patients: EntrepreneurPulse.com.au  Fact Sheet for Healthcare Providers: IncredibleEmployment.be  This test is not yet approved or cleared by the [place] FDA and has been authorized for detection and/or diagnosis of SARS-CoV-2 by FDA under an Emergency Use Authorization (EUA). This EUA will remain in effect  (meaning this test can be used) for the duration of the COVID-19 declaration under Section 564(b)(1) of the Act, 21 U.S.C. section 360bbb-3(b)(1), unless the authorization is terminated or revoked.     Resp Syncytial Virus by PCR NEGATIVE NEGATIVE Final    Comment: (NOTE) Fact Sheet for Patients: EntrepreneurPulse.com.au  Fact Sheet for Healthcare Providers: IncredibleEmployment.be  This test is not yet approved or cleared by the [place] FDA and has been authorized for detection and/or diagnosis of SARS-CoV-2 by FDA under an Emergency Use Authorization (EUA). This EUA will remain in effect (meaning this test can be used) for the duration of the  COVID-19 declaration under Section 564(b)(1) of the Act, 21 U.S.C. section 360bbb-3(b)(1), unless the authorization is terminated or revoked.  Performed at [facility], [address], [place], [place] [address]          Radiology Studies: No results found.      Scheduled Meds:  amLODipine  10 mg Per Tube Daily   apixaban  5 mg Per Tube BID   atorvastatin  80 mg Per Tube QPM   feeding supplement (OSMOLITE 1.5 CAL)  237 mL Per Tube 6 X Daily   free water  140 mL Per Tube 6 X Daily   lidocaine  1 patch Transdermal Q24H   mirtazapine  7.5 mg Per Tube Daily   mouth rinse  15 mL Mouth Rinse 4 times per day   pantoprazole (PROTONIX) IV  40 mg Intravenous Q24H   sodium chloride flush  3 mL Intravenous Q12H   Continuous Infusions:     LOS: 7 days   Time spent= 35 mins    Herndon Grill Joline Maxcy, MD [facility]  If 7PM-7AM, please contact night-coverage  08/29/2022, 8:22 AM

## 2022-08-30 DIAGNOSIS — N179 Acute kidney failure, unspecified: Secondary | ICD-10-CM | POA: Diagnosis not present

## 2022-08-30 DIAGNOSIS — J690 Pneumonitis due to inhalation of food and vomit: Secondary | ICD-10-CM | POA: Diagnosis not present

## 2022-08-30 DIAGNOSIS — E43 Unspecified severe protein-calorie malnutrition: Secondary | ICD-10-CM

## 2022-08-30 DIAGNOSIS — I959 Hypotension, unspecified: Secondary | ICD-10-CM

## 2022-08-30 DIAGNOSIS — R1312 Dysphagia, oropharyngeal phase: Secondary | ICD-10-CM | POA: Diagnosis not present

## 2022-08-30 DIAGNOSIS — I480 Paroxysmal atrial fibrillation: Secondary | ICD-10-CM

## 2022-08-30 LAB — GLUCOSE, CAPILLARY
Glucose-Capillary: 194 mg/dL — ABNORMAL HIGH (ref 70–99)
Glucose-Capillary: 200 mg/dL — ABNORMAL HIGH (ref 70–99)
Glucose-Capillary: 117 mg/dL — ABNORMAL HIGH (ref 70–99)
Glucose-Capillary: 109 mg/dL — ABNORMAL HIGH (ref 70–99)

## 2022-08-30 LAB — BASIC METABOLIC PANEL
Anion gap: 6 (ref 5–15)
BUN: 34 mg/dL — ABNORMAL HIGH (ref 8–23)
CO2: 24 mmol/L (ref 22–32)
Calcium: 8.3 mg/dL — ABNORMAL LOW (ref 8.9–10.3)
Chloride: 115 mmol/L — ABNORMAL HIGH (ref 98–111)
Creatinine, Ser: 2.29 mg/dL — ABNORMAL HIGH (ref 0.61–1.24)
GFR, Estimated: 31 mL/min — ABNORMAL LOW (ref >60)
Glucose, Bld: 134 mg/dL — ABNORMAL HIGH (ref 70–99)
Potassium: 4.7 mmol/L (ref 3.5–5.1)
Sodium: 145 mmol/L (ref 135–145)

## 2022-08-30 LAB — CBC
HCT: 24.4 % — ABNORMAL LOW (ref 39.0–52.0)
Hemoglobin: 7.4 g/dL — ABNORMAL LOW (ref 13.0–17.0)
MCH: 26.6 pg (ref 26.0–34.0)
MCHC: 30.3 g/dL (ref 30.0–36.0)
MCV: 87.8 fL (ref 80.0–100.0)
Platelets: 160 10*3/uL (ref 150–400)
RBC: 2.78 MIL/uL — ABNORMAL LOW (ref 4.22–5.81)
RDW: 18.6 % — ABNORMAL HIGH (ref 11.5–15.5)
WBC: 9 10*3/uL (ref 4.0–10.5)
nRBC: 0 % (ref 0.0–0.2)

## 2022-08-30 LAB — HEMOGLOBIN A1C
Hgb A1c MFr Bld: 6.3 % — ABNORMAL HIGH (ref 4.8–5.6)
Mean Plasma Glucose: 134.11 mg/dL

## 2022-08-30 LAB — MAGNESIUM: Magnesium: 2.4 mg/dL (ref 1.7–2.4)

## 2022-08-30 MED ORDER — LACTATED RINGERS IV BOLUS
1000.0000 mL | Freq: Once | INTRAVENOUS | Status: AC
  Administered 2022-08-30: 1000 mL via INTRAVENOUS

## 2022-08-30 MED ORDER — INSULIN ASPART 100 UNIT/ML IJ SOLN
0.0000 [IU] | INTRAMUSCULAR | Status: DC
  Administered 2022-08-30 – 2022-08-31 (×2): 2 [IU] via SUBCUTANEOUS
  Filled 2022-08-30 (×2): qty 1

## 2022-08-30 MED ORDER — SILVER NITRATE-POT NITRATE 75-25 % EX MISC
1.0000 | Freq: Once | CUTANEOUS | Status: DC
  Filled 2022-08-30: qty 1

## 2022-08-30 NOTE — Progress Notes (Signed)
Patient refused all evening medication, tube feeding and water.  Dr Cyndia Skeeters notified with no new orders.

## 2022-08-30 NOTE — Progress Notes (Signed)
Dr Cyndia Skeeters notified of Osmolite refusal and increased abdominal breaking from gastric tube with abdominal binder; no additional orders.

## 2022-08-30 NOTE — TOC Progression Note (Addendum)
Transition of Care (TOC) - Progression Note    Patient Details  Name: Evan VORIS Sr. MRN: [id number] Date of Birth: 03/04/1956  Transition of Care [facility]) CM/SW [place], LCSW Phone Number: 08/30/2022, 10:40 AM  Clinical Narrative:    Spoke with patient's wife via phone regarding recommendation for SNF. She states they do not want SNF, she wants to take patient home with home health. No agency preference. Requests RN, PT, Aide. Confirmed home address.  HH referral accepted by Malachy Mood with [facility].  Patient's wife states patient has all DME including a hospital bed, but is asking for supplies to be sent home from floor until she can order a [place]- notified RN and MD.  Patient's wife states patient also has an aide coming out 5 days a week through [facility] by [employer]. NP also recommends outpatient palliative care, per [facility] with [facility] patient is not eligible for Outpatient Palliative.    Expected Discharge Plan:  (unknown at this time) Barriers to Discharge: Continued Medical Work up  Expected Discharge Plan and Services                                               Social Determinants of Health (SDOH) Interventions SDOH Screenings   Food Insecurity: No Food Insecurity (07/27/2022)  Housing: Low Risk  (07/27/2022)  Transportation Needs: No Transportation Needs (07/27/2022)  Utilities: Not At Risk (07/27/2022)  Alcohol Screen: Low Risk  (03/08/2020)  Depression (PHQ2-9): High Risk (04/06/2022)  Financial Resource Strain: Low Risk  (04/30/2022)  Physical Activity: Unknown (11/25/2018)  Social Connections: Moderately Integrated (01/13/2018)  Stress: No Stress Concern Present (11/25/2018)  Tobacco Use: Medium Risk (08/27/2022)    Readmission Risk Interventions     No data to display

## 2022-08-30 NOTE — Progress Notes (Addendum)
PROGRESS NOTE  Evan EDMONDS Sr. ONG:[id number] DOB: 1956/04/03   PCP: Jon Billings, NP  Patient is from: Home  DOA: 08/21/2022 LOS: 8  Chief complaints Chief Complaint  Patient presents with   Leg Pain     Brief Narrative / Interim history: 67 year old M with PMH of recurrent CVA with residual left hemiparesis, dysarthria, aspiration, paroxysmal A-fib on Eliquis, DM-2, CKD-3A, bradycardia/AVB presenting with generalized weakness and poor p.o. intake.  He was admitted for septic shock in the setting of aspiration pneumonia.  He was evaluated by speech therapy and deemed to be high risk for aspiration.  General surgery consulted and he had a PEG tube placed on 08/26/2022.  Nutrition consulted and he was started on tube feed.  Patient completed antibiotic course for aspiration pneumonia in 08/27/2022.  He was evaluated by therapy who recommended SNF.  However, patient's wife prefers to take patient home with home health which is planned for 08/31/2022.  Subjective: Seen and examined earlier this morning.  No major events overnight of this morning.  Patient's wife reports some bleeding around the G-tube.  Patient is asking for ice chips.  No other complaints or concerns.  Patient's wife prefers to take patient home with home health on 08/31/2022.  Objective: Vitals:   08/30/22 0432 08/30/22 0500 08/30/22 0846 08/30/22 0846  BP: (!) 134/90  (!) 141/63 (!) 141/63  Pulse: 84  81 81  Resp: 18  16 16   Temp: 98.3 F (36.8 C)  98.6 F (37 C) 98.6 F (37 C)  TempSrc: Oral     SpO2: 97%  99% 100%  Weight:  63.3 kg    Height:        Examination:  GENERAL: No apparent distress.  Nontoxic. HEENT: MMM.  Vision and hearing grossly intact.  NECK: Supple.  No apparent JVD.  RESP:  No IWOB.  Fair aeration bilaterally. CVS:  RRR. Heart sounds normal.  ABD/GI/GU: BS+. Abd soft, NTND.  Scant bloody staining on dressing around G-tube.  No active bleeding. MSK/EXT:  Moves extremities. No  apparent deformity. No edema.  SKIN: no apparent skin lesion or wound NEURO: Awake, alert and oriented appropriately.  Significant dysarthria.  Left hemiparesis. PSYCH: Calm. Normal affect.   Procedures:  1/10-PEG tube placement by general surgery  Microbiology summarized: COVID-19, influenza and RSV PCR nonreactive. Blood cultures NGTD  Assessment and plan: Principal Problem:   Aspiration pneumonia ([place]) Active Problems:   Acute kidney injury superimposed on chronic kidney disease (HCC)   Hypotension   Recurrent strokes (HCC)   Controlled type 2 diabetes mellitus with hyperglycemia (HCC)   Sinus bradycardia   Paroxysmal atrial fibrillation (HCC)   Septic shock (HCC)   Other dysphagia   AKI (acute kidney injury) ([place])   Dysphagia   Protein-calorie malnutrition, severe   PEG (percutaneous endoscopic gastrostomy) status ([place])  Septic shock due to aspiration pneumonia: Patient with known history of aspiration in the setting of recurrent CVA.  Completed antibiotic course on 08/27/2022.  Currently on room air. -SLP recommended n.p.o.  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 Recent Labs    08/21/22 1324 08/22/22 1650 08/23/22 0342 08/24/22 0239 08/25/22 0454 08/26/22 0357 08/27/22 0515 08/28/22 0543 08/29/22 0621 08/30/22 0523  BUN 36* 31* 27* 21 17 14 14  24* 29* 34*  CREATININE 2.53* 2.35* 2.19* 2.00* 1.96* 1.93* 1.95* 2.11* 2.26* 2.29*  -Avoid nephrotoxic meds. -Will try LR bolus 1000 cc at 250 cc an hour and  recheck in the morning  Hyponatremia/hypernatremia: Resolved.   Essential hypertension: SBP in 140s. -Continue amlodipine 10 mg daily -Continue hydralazine.   History of recurrent CVA with left hemiparesis, dysarthria and dysphagia -Continue home Eliquis, statin -PT/OT   Controlled NIDDM-2 with hyperglycemia and CKD-3A: A1c 6.8% in 06/2022.  Was on Farxiga prior to hospitalization. Recent Labs  Lab 08/28/22 2140  08/29/22 1233 08/29/22 1645 08/30/22 0848 08/30/22 1138  GLUCAP 131* 228* 213* 194* 200*  -Start SSI while in-house -Recheck hemoglobin A1c -May consider low-dose glipizide on discharge.  Anemia of chronic disease: H&H relatively stable. Recent Labs    06/23/22 0351 08/21/22 1324 08/22/22 1650 08/23/22 0342 08/24/22 0239 08/25/22 0454 08/27/22 0515 08/28/22 0543 08/29/22 0621 08/30/22 0523  HGB 8.2* 9.2* 8.4* 8.4* 8.5* 8.9* 8.2* 8.2* 7.4* 7.4*  -Check anemia panel  Sinus bradycardia/history of second-degree AVB/paroxysmal A-fib: On Eliquis at home. -Continue home Eliquis. -Avoid nodal blocking agents.  Severe dysphagia/severe protein calorie malnutrition.  Body mass index is 22.52 kg/m. Nutrition Problem: Severe Malnutrition Etiology: chronic illness (CVA) Signs/Symptoms: percent weight loss, moderate fat depletion, severe fat depletion, moderate muscle depletion, severe muscle depletion Interventions: Refer to RD note for recommendations -SLP recommended NPO. -PEG tube placed on 08/26/2022. -Bolus feeding per recommendation by dietitian.   DVT prophylaxis:   apixaban (ELIQUIS) tablet 5 mg  Code Status: DNR/DNI Family Communication: Updated patient's wife at bedside Level of care: Med-Surg Status is: Inpatient Remains inpatient appropriate because: Severe dysphagia   Final disposition: Home with home health on 1/15 Consultants:  Pulmonology General surgery Palliative medicine  35 minutes with more than 50% spent in reviewing records, counseling patient/family and coordinating care.   Sch Meds:  Scheduled Meds:  amLODipine  10 mg Per Tube Daily   apixaban  5 mg Per Tube BID   atorvastatin  80 mg Per Tube QPM   feeding supplement (OSMOLITE 1.5 CAL)  237 mL Per Tube 6 X Daily   free water  200 mL Per Tube Q4H   insulin aspart  0-9 Units Subcutaneous Q4H   lidocaine  1 patch Transdermal Q24H   mirtazapine  7.5 mg Per Tube Daily   mouth rinse  15 mL  Mouth Rinse 4 times per day   pantoprazole (PROTONIX) IV  40 mg Intravenous Q24H   sodium chloride flush  3 mL Intravenous Q12H   Continuous Infusions:  lactated ringers     PRN Meds:.acetaminophen (TYLENOL) oral liquid 160 mg/5 mL **OR** acetaminophen, guaiFENesin, hydrALAZINE, ipratropium-albuterol, metoprolol tartrate, ondansetron **OR** ondansetron (ZOFRAN) IV, mouth rinse, oxyCODONE, senna-docusate  Antimicrobials: Anti-infectives (From admission, onward)    Start     Dose/Rate Route Frequency Ordered Stop   08/24/22 1200  Ampicillin-Sulbactam (UNASYN) 3 g in sodium chloride 0.9 % 100 mL IVPB        3 g 200 mL/hr over 30 Minutes Intravenous Every 6 hours 08/24/22 0850 08/27/22 2359   08/22/22 0626  Ampicillin-Sulbactam (UNASYN) 3 g in sodium chloride 0.9 % 100 mL IVPB  Status:  Discontinued        3 g 200 mL/hr over 30 Minutes Intravenous Every 12 hours 08/21/22 1854 08/24/22 0850   08/21/22 2200  cefTRIAXone (ROCEPHIN) 2 g in sodium chloride 0.9 % 100 mL IVPB  Status:  Discontinued        2 g 200 mL/hr over 30 Minutes Intravenous Every 24 hours 08/21/22 1727 08/21/22 1734   08/21/22 1800  Ampicillin-Sulbactam (UNASYN) 3 g in sodium chloride 0.9 % 100 mL IVPB  Status:  Discontinued        3 g 200 mL/hr over 30 Minutes Intravenous Every 6 hours 08/21/22 1734 08/21/22 1854   08/21/22 1545  vancomycin (VANCOREADY) IVPB 1250 mg/250 mL        1,250 mg 166.7 mL/hr over 90 Minutes Intravenous  Once 08/21/22 1539 08/21/22 1844   08/21/22 1500  vancomycin (VANCOCIN) IVPB 1000 mg/200 mL premix  Status:  Discontinued        1,000 mg 200 mL/hr over 60 Minutes Intravenous  Once 08/21/22 1450 08/21/22 1539   08/21/22 1500  ceFEPIme (MAXIPIME) 2 g in sodium chloride 0.9 % 100 mL IVPB        2 g 200 mL/hr over 30 Minutes Intravenous  Once 08/21/22 1450 08/21/22 1625   08/21/22 1500  azithromycin (ZITHROMAX) 500 mg in sodium chloride 0.9 % 250 mL IVPB        500 mg 250 mL/hr over 60 Minutes  Intravenous  Once 08/21/22 1450 08/21/22 1800        I have personally reviewed the following labs and images: CBC: Recent Labs  Lab 08/25/22 0454 08/27/22 0515 08/28/22 0543 08/29/22 0621 08/30/22 0523  WBC 5.2 6.0 6.8 8.5 9.0  HGB 8.9* 8.2* 8.2* 7.4* 7.4*  HCT 28.2* 27.0* 26.9* 24.3* 24.4*  MCV 86.0 87.1 87.6 87.7 87.8  PLT 145* 117* 144* 153 160   BMP &GFR Recent Labs  Lab 08/26/22 0357 08/26/22 1404 08/27/22 0515 08/27/22 2101 08/28/22 0543 08/29/22 0621 08/30/22 0523  NA 146*  --  145  --  145 147* 145  K 4.0  --  4.1  --  4.3 4.2 4.7  CL 117*  --  116*  --  115* 116* 115*  CO2 22  --  19*  --  22 23 24   GLUCOSE 130*  --  69*  --  161* 83 134*  BUN 14  --  14  --  24* 29* 34*  CREATININE 1.93*  --  1.95*  --  2.11* 2.26* 2.29*  CALCIUM 8.9  --  8.7*  --  8.4* 8.5* 8.3*  MG 1.7 1.8 1.8 1.8  --  2.2 2.4  PHOS 3.7 3.9 3.6 3.6  --   --   --    Estimated Creatinine Clearance: 28.8 mL/min (A) (by C-G formula based on SCr of 2.29 mg/dL (H)). Liver & Pancreas: Recent Labs  Lab 08/24/22 0239  AST 86*  ALT 102*  ALKPHOS 172*  BILITOT 0.7  PROT 7.0  ALBUMIN 2.6*   No results for input(s): "LIPASE", "AMYLASE" in the last 168 hours. No results for input(s): "AMMONIA" in the last 168 hours. Diabetic: No results for input(s): "HGBA1C" in the last 72 hours. Recent Labs  Lab 08/28/22 2140 08/29/22 1233 08/29/22 1645 08/30/22 0848 08/30/22 1138  GLUCAP 131* 228* 213* 194* 200*   Cardiac Enzymes: No results for input(s): "CKTOTAL", "CKMB", "CKMBINDEX", "TROPONINI" in the last 168 hours. No results for input(s): "PROBNP" in the last 8760 hours. Coagulation Profile: No results for input(s): "INR", "PROTIME" in the last 168 hours. Thyroid Function Tests: No results for input(s): "TSH", "T4TOTAL", "FREET4", "T3FREE", "THYROIDAB" in the last 72 hours. Lipid Profile: No results for input(s): "CHOL", "HDL", "LDLCALC", "TRIG", "CHOLHDL", "LDLDIRECT" in the last 72  hours. Anemia Panel: No results for input(s): "VITAMINB12", "FOLATE", "FERRITIN", "TIBC", "IRON", "RETICCTPCT" in the last 72 hours. Urine analysis:    Component Value Date/Time   COLORURINE YELLOW (A) 06/22/2022 1722   APPEARANCEUR Turbid (  A) 07/17/2022 1050   LABSPEC 1.012 06/22/2022 1722   PHURINE 5.0 06/22/2022 1722   GLUCOSEU 1+ (A) 07/17/2022 1050   HGBUR SMALL (A) 06/22/2022 1722   BILIRUBINUR Negative 07/17/2022 1050   KETONESUR NEGATIVE 06/22/2022 1722   PROTEINUR 3+ (A) 07/17/2022 1050   PROTEINUR >=300 (A) 06/22/2022 1722   NITRITE Negative 07/17/2022 1050   NITRITE NEGATIVE 06/22/2022 1722   LEUKOCYTESUR 1+ (A) 07/17/2022 1050   LEUKOCYTESUR NEGATIVE 06/22/2022 1722   Sepsis Labs: Invalid input(s): "PROCALCITONIN", "LACTICIDVEN"  Microbiology: Recent Results (from the past 240 hour(s))  Culture, blood (Routine x 2)     Status: None   Collection Time: 08/21/22  1:24 PM   Specimen: BLOOD  Result Value Ref Range Status   Specimen Description BLOOD BLOOD LEFT HAND  Final   Special Requests   Final    BOTTLES DRAWN AEROBIC AND ANAEROBIC Blood Culture adequate volume   Culture   Final    NO GROWTH 5 DAYS Performed at [facility], [address] [place], [place] [address]    Report Status 08/26/2022 FINAL  Final  Culture, blood (Routine x 2)     Status: None   Collection Time: 08/21/22  1:24 PM   Specimen: BLOOD  Result Value Ref Range Status   Specimen Description BLOOD BLOOD LEFT HAND  Final   Special Requests   Final    BOTTLES DRAWN AEROBIC AND ANAEROBIC Blood Culture results may not be optimal due to an inadequate volume of blood received in culture bottles   Culture   Final    NO GROWTH 5 DAYS Performed at [facility], [address] [place], [place] [address]    Report Status 08/26/2022 FINAL  Final  Resp panel by RT-PCR (RSV, Flu A&B, Covid) Anterior Nasal Swab     Status: None   Collection Time: 08/21/22  3:47 PM   Specimen:  Anterior Nasal Swab  Result Value Ref Range Status   SARS Coronavirus 2 by RT PCR NEGATIVE NEGATIVE Final    Comment: (NOTE) SARS-CoV-2 target nucleic acids are NOT DETECTED.  The SARS-CoV-2 RNA is generally detectable in upper respiratory specimens during the acute phase of infection. The lowest concentration of SARS-CoV-2 viral copies this assay can detect is 138 copies/mL. A negative result does not preclude SARS-Cov-2 infection and should not be used as the sole basis for treatment or other patient management decisions. A negative result may occur with  improper specimen collection/handling, submission of specimen other than nasopharyngeal swab, presence of viral mutation(s) within the areas targeted by this assay, and inadequate number of viral copies(<138 copies/mL). A negative result must be combined with clinical observations, patient history, and epidemiological information. The expected result is Negative.  Fact Sheet for Patients:  BloggerCourse.com  Fact Sheet for Healthcare Providers:  SeriousBroker.it  This test is no t yet approved or cleared by the [place] FDA and  has been authorized for detection and/or diagnosis of SARS-CoV-2 by FDA under an Emergency Use Authorization (EUA). This EUA will remain  in effect (meaning this test can be used) for the duration of the COVID-19 declaration under Section 564(b)(1) of the Act, 21 U.S.C.section 360bbb-3(b)(1), unless the authorization is terminated  or revoked sooner.       Influenza A by PCR NEGATIVE NEGATIVE Final   Influenza B by PCR NEGATIVE NEGATIVE Final    Comment: (NOTE) The Xpert Xpress SARS-CoV-2/FLU/RSV plus assay is intended as an aid in the diagnosis of influenza from Nasopharyngeal  swab specimens and should not be used as a sole basis for treatment. Nasal washings and aspirates are unacceptable for Xpert Xpress SARS-CoV-2/FLU/RSV testing.  Fact  Sheet for Patients: EntrepreneurPulse.com.au  Fact Sheet for Healthcare Providers: IncredibleEmployment.be  This test is not yet approved or cleared by the [place] FDA and has been authorized for detection and/or diagnosis of SARS-CoV-2 by FDA under an Emergency Use Authorization (EUA). This EUA will remain in effect (meaning this test can be used) for the duration of the COVID-19 declaration under Section 564(b)(1) of the Act, 21 U.S.C. section 360bbb-3(b)(1), unless the authorization is terminated or revoked.     Resp Syncytial Virus by PCR NEGATIVE NEGATIVE Final    Comment: (NOTE) Fact Sheet for Patients: EntrepreneurPulse.com.au  Fact Sheet for Healthcare Providers: IncredibleEmployment.be  This test is not yet approved or cleared by the [place] FDA and has been authorized for detection and/or diagnosis of SARS-CoV-2 by FDA under an Emergency Use Authorization (EUA). This EUA will remain in effect (meaning this test can be used) for the duration of the COVID-19 declaration under Section 564(b)(1) of the Act, 21 U.S.C. section 360bbb-3(b)(1), unless the authorization is terminated or revoked.  Performed at [facility], [address] [place], [place] [address]     Radiology Studies: No results found.    Skylynne Schlechter T. [place]  If 7PM-7AM, please contact night-coverage [email or web address] 08/30/2022, 11:50 AM

## 2022-08-30 NOTE — Progress Notes (Signed)
[facility] Liaison Note  Notified by [facility] of patient/family request of [facility] Paliative services. Unfortunately, patient does not meet criteria for OPP at this time.  Please call with any questions/concerns.    Thank you for the opportunity to participate in this patient's care.   Daphene Calamity, MSW [facility] Liaison  [phone]

## 2022-08-30 NOTE — Progress Notes (Signed)
The gauze surrounding the gastric tube was saturated and removed.  It was determined that the blood was coming from under the surface of the skin.  The drainage sponges were changed, abdominal binder replaced and Dr Christian Mate notified.

## 2022-08-31 DIAGNOSIS — N179 Acute kidney failure, unspecified: Secondary | ICD-10-CM | POA: Diagnosis not present

## 2022-08-31 DIAGNOSIS — J690 Pneumonitis due to inhalation of food and vomit: Secondary | ICD-10-CM | POA: Diagnosis not present

## 2022-08-31 DIAGNOSIS — A419 Sepsis, unspecified organism: Secondary | ICD-10-CM | POA: Diagnosis not present

## 2022-08-31 DIAGNOSIS — I959 Hypotension, unspecified: Secondary | ICD-10-CM | POA: Diagnosis not present

## 2022-08-31 LAB — RENAL FUNCTION PANEL
Albumin: 2.8 g/dL — ABNORMAL LOW (ref 3.5–5.0)
Anion gap: 8 (ref 5–15)
BUN: 32 mg/dL — ABNORMAL HIGH (ref 8–23)
CO2: 23 mmol/L (ref 22–32)
Calcium: 8.7 mg/dL — ABNORMAL LOW (ref 8.9–10.3)
Chloride: 111 mmol/L (ref 98–111)
Creatinine, Ser: 2.05 mg/dL — ABNORMAL HIGH (ref 0.61–1.24)
GFR, Estimated: 35 mL/min — ABNORMAL LOW (ref >60)
Glucose, Bld: 127 mg/dL — ABNORMAL HIGH (ref 70–99)
Phosphorus: 3.4 mg/dL (ref 2.5–4.6)
Potassium: 5.1 mmol/L (ref 3.5–5.1)
Sodium: 142 mmol/L (ref 135–145)

## 2022-08-31 LAB — RETICULOCYTES
Immature Retic Fract: 28.5 % — ABNORMAL HIGH (ref 2.3–15.9)
RBC.: 2.87 MIL/uL — ABNORMAL LOW (ref 4.22–5.81)
Retic Count, Absolute: 33.9 10*3/uL (ref 19.0–186.0)
Retic Ct Pct: 1.2 % (ref 0.4–3.1)

## 2022-08-31 LAB — IRON AND TIBC
Iron: 17 ug/dL — ABNORMAL LOW (ref 45–182)
Saturation Ratios: 8 % — ABNORMAL LOW (ref 17.9–39.5)
TIBC: 216 ug/dL — ABNORMAL LOW (ref 250–450)
UIBC: 199 ug/dL

## 2022-08-31 LAB — VITAMIN B12: Vitamin B-12: 714 pg/mL (ref 180–914)

## 2022-08-31 LAB — CBC
HCT: 24.9 % — ABNORMAL LOW (ref 39.0–52.0)
Hemoglobin: 7.6 g/dL — ABNORMAL LOW (ref 13.0–17.0)
MCH: 26.7 pg (ref 26.0–34.0)
MCHC: 30.5 g/dL (ref 30.0–36.0)
MCV: 87.4 fL (ref 80.0–100.0)
Platelets: 195 10*3/uL (ref 150–400)
RBC: 2.85 MIL/uL — ABNORMAL LOW (ref 4.22–5.81)
RDW: 17.9 % — ABNORMAL HIGH (ref 11.5–15.5)
WBC: 11.4 10*3/uL — ABNORMAL HIGH (ref 4.0–10.5)
nRBC: 0 % (ref 0.0–0.2)

## 2022-08-31 LAB — GLUCOSE, CAPILLARY
Glucose-Capillary: 111 mg/dL — ABNORMAL HIGH (ref 70–99)
Glucose-Capillary: 112 mg/dL — ABNORMAL HIGH (ref 70–99)
Glucose-Capillary: 116 mg/dL — ABNORMAL HIGH (ref 70–99)
Glucose-Capillary: 188 mg/dL — ABNORMAL HIGH (ref 70–99)

## 2022-08-31 LAB — FOLATE: Folate: 19.2 ng/mL (ref >5.9)

## 2022-08-31 LAB — FERRITIN: Ferritin: 425 ng/mL — ABNORMAL HIGH (ref 24–336)

## 2022-08-31 LAB — MAGNESIUM: Magnesium: 2.3 mg/dL (ref 1.7–2.4)

## 2022-08-31 MED ORDER — CARVEDILOL 3.125 MG PO TABS: 3.1250 mg | ORAL_TABLET | Freq: Two times a day (BID) | ORAL | 0 refills | Status: AC

## 2022-08-31 MED ORDER — METOCLOPRAMIDE HCL 5 MG PO TABS: 5.0000 mg | ORAL_TABLET | Freq: Four times a day (QID) | ORAL | 0 refills | Status: DC | PRN

## 2022-08-31 MED ORDER — PROSOURCE TF20 ENFIT COMPATIBL EN LIQD: 60.0000 mL | Freq: Every day | ENTERAL | 0 refills | Status: AC

## 2022-08-31 MED ORDER — OSMOLITE 1.5 CAL PO LIQD: 237.0000 mL | Freq: Every day | ORAL | 2 refills | Status: AC

## 2022-08-31 MED ORDER — ACETAMINOPHEN 160 MG/5ML PO SOLN: 650.0000 mg | Freq: Four times a day (QID) | ORAL | 2 refills | Status: AC | PRN

## 2022-08-31 MED ORDER — TAMSULOSIN HCL 0.4 MG PO CAPS: ORAL_CAPSULE | ORAL | 1 refills | Status: AC

## 2022-08-31 MED ORDER — OXYCODONE HCL 5 MG PO TABS: 5.0000 mg | ORAL_TABLET | ORAL | Status: DC | PRN

## 2022-08-31 MED ORDER — SENNOSIDES-DOCUSATE SODIUM 8.6-50 MG PO TABS: 1.0000 | ORAL_TABLET | Freq: Every evening | ORAL | Status: DC | PRN

## 2022-08-31 MED ORDER — ORAL CARE MOUTH RINSE: 15.0000 mL | Freq: Four times a day (QID) | OROMUCOSAL | 0 refills | Status: AC

## 2022-08-31 MED ORDER — OMEPRAZOLE 2 MG/ML ORAL SUSPENSION: 40.0000 mg | Freq: Every day | ORAL | 1 refills | Status: AC

## 2022-08-31 NOTE — Discharge Summary (Signed)
Physician Discharge Summary  Evan BASKETTE Sr. FIE:[id number] DOB: March 27, 1956 DOA: 08/21/2022  PCP: Jon Billings, NP  Admit date: 08/21/2022 Discharge date: 08/31/2022 Admitted From: Home Disposition: Home Recommendations for Outpatient Follow-up:  Follow up with PCP in 1 to 2 weeks Check CMP and CBC in 1 week Please follow up on the following pending results: None  Home Health: PT/OT/RN/SLP/CSW/aide Equipment/Devices: Oral Suction.  Patient has other DME  Discharge Condition: Stable but guarded prognosis CODE STATUS: DNR/DNI  Follow-up Information     Jon Billings, NP. Go on 10/06/2022.   Specialty: Nurse Practitioner Why: Arrive 15 mins before schedule appt. at 1:00pm Contact information: [place] [address] [facility] 67 year old M with PMH of recurrent CVA with residual left hemiparesis, dysarthria, aspiration, paroxysmal A-fib on Eliquis, DM-2, CKD-3A, bradycardia/AVB presenting with generalized weakness and poor p.o. intake.  He was admitted for septic shock in the setting of aspiration pneumonia.  He was evaluated by speech therapy and deemed to be high risk for aspiration.  General surgery consulted and he had a PEG tube placed on 08/26/2022.  Nutrition consulted and he was started on tube feed.  Patient completed antibiotic course for aspiration pneumonia in 08/27/2022.  He was evaluated by therapy who recommended SNF.  However, patient's wife preferred to take patient home with home health which is planned for 08/31/2022.   He is discharged with [facility] PT/OT/RN/SLP/CSW and aide.  Has appropriate DME.  We have added oral suction if available.  See individual problem list below for more.   Problems addressed during this hospitalization Principal Problem:   Aspiration pneumonia ([place]) Active Problems:   Acute kidney injury superimposed on chronic kidney disease (HCC)   Hypotension   Recurrent strokes (HCC)    Controlled type 2 diabetes mellitus with hyperglycemia (HCC)   Sinus bradycardia   Paroxysmal atrial fibrillation (HCC)   Septic shock (HCC)   Other dysphagia   AKI (acute kidney injury) ([place])   Dysphagia   Protein-calorie malnutrition, severe   PEG (percutaneous endoscopic gastrostomy) status ([place])   Septic shock due to aspiration pneumonia: Patient with known history of aspiration in the setting of recurrent CVA.  Completed antibiotic course on 08/27/2022.  Currently on room air. -SLP recommended n.p.o. -Ordered oral suction for home use.   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 Recent Labs    08/22/22 1650 08/23/22 0342 08/24/22 0239 08/25/22 0454 08/26/22 0357 08/27/22 0515 08/28/22 0543 08/29/22 0621 08/30/22 0523 08/31/22 0217  BUN 31* 27* 21 17 14 14  24* 29* 34* 32*  CREATININE 2.35* 2.19* 2.00* 1.96* 1.93* 1.95* 2.11* 2.26* 2.29* 2.05*  -Avoid nephrotoxic meds. -Continue free water as below. -Recheck in 1 week   Hyponatremia/hypernatremia: Resolved.   Essential hypertension: BP slightly elevated. -Continue amlodipine 10 mg daily -Added low-dose Coreg.   History of recurrent CVA with left hemiparesis, dysarthria and dysphagia -Continue home Eliquis, statin -PT/OT/SLP   Controlled NIDDM-2 with hyperglycemia and CKD-3A: A1c 6.8% in 06/2022.  Blood glucose within acceptable range. -Discontinued Farxiga given his poor renal function.   Anemia of chronic disease: H&H relatively stable. -Check CBC at follow-up.   Sinus bradycardia/paroxysmal A-fib: HR in the range of 70s to 80s.  On Eliquis at home. -Continue home Eliquis. -Added low-dose Coreg mainly for hypertension.   Severe dysphagia/severe protein calorie malnutrition.  Nutrition Problem:  Severe Malnutrition Etiology: chronic illness (CVA) Signs/Symptoms: percent weight loss, moderate fat depletion, severe fat depletion, moderate muscle depletion, severe muscle  depletion Interventions: Refer to RD note for recommendations -SLP recommended NPO. -PEG tube placed on 08/26/2022. -Bolus feeding per recommendation by dietitian.    Vital signs Vitals:   08/30/22 2000 08/31/22 0531 08/31/22 0804 08/31/22 1258  BP: (!) 153/85 (!) 170/84 (!) 169/91 (!) 163/81  Pulse: 84 77 78 79  Temp: 98.6 F (37 C) 97.8 F (36.6 C) 98.7 F (37.1 C) 98.4 F (36.9 C)  Resp: 20 18 19 18   Height:      Weight:      SpO2: 100% 100% 100% 100%  TempSrc: Oral Oral Oral Oral  BMI (Calculated):         Discharge exam  GENERAL: No apparent distress.  Nontoxic. HEENT: MMM.  Vision and hearing grossly intact.  NECK: Supple.  No apparent JVD.  RESP:  No IWOB.  Upper airway sounds transmitted throughout. CVS:  RRR. Heart sounds normal.  ABD/GI/GU: BS+. Abd soft, NTND.  G-tube in place.  Some old bloody staining on the dressing. MSK/EXT:  Moves extremities. No apparent deformity. No edema.  SKIN: no apparent skin lesion or wound NEURO: Sleepy but wakes to voice.  Follows commands.  Dysarthria and left hemiparesis. PSYCH: Calm. Normal affect.   Discharge Instructions Discharge Instructions     Discharge instructions   Complete by: As directed    It has been a pleasure taking care of you!  You were hospitalized due to aspiration pneumonia for which you have been treated with antibiotics.  You have feeding tube placed.  Continue medications as prescribed.  Follow-up with your primary care doctor in 1 to 2 weeks or sooner if needed.   Take care,   Increase activity slowly   Complete by: As directed       Allergies as of 08/31/2022   No Known Allergies      Medication List     STOP taking these medications    dapagliflozin propanediol 10 MG Tabs tablet Commonly known as: Farxiga   losartan 25 MG tablet Commonly known as: COZAAR   melatonin 1 MG Tabs tablet   omeprazole 40 MG capsule Commonly known as: PRILOSEC Replaced by: omeprazole 2 mg/mL Susp  oral suspension   pregabalin 50 MG capsule Commonly known as: LYRICA       TAKE these medications    acetaminophen 160 MG/5ML solution Commonly known as: TYLENOL Place 20.3 mLs (650 mg total) into feeding tube every 6 (six) hours as needed for mild pain or fever.   albuterol 108 (90 Base) MCG/ACT inhaler Commonly known as: VENTOLIN HFA Inhale 2 puffs into the lungs every 4 (four) hours as needed for wheezing or shortness of breath.   amLODipine 10 MG tablet Commonly known as: NORVASC Place 1 tablet (10 mg total) into feeding tube daily. What changed: how to take this   apixaban 5 MG Tabs tablet Commonly known as: Eliquis Place 1 tablet (5 mg total) into feeding tube 2 (two) times daily. What changed: how to take this   atorvastatin 80 MG tablet Commonly known as: LIPITOR Place 1 tablet (80 mg total) into feeding tube every evening. What changed: how to take this   carvedilol 3.125 MG tablet Commonly known as: Coreg Place 1 tablet (3.125 mg total) into feeding tube 2 (two) times daily for 90 doses.   feeding supplement (OSMOLITE 1.5 CAL) Liqd Place 237 mLs into feeding tube  6 (six) times daily.   feeding supplement (PROSource TF20) liquid Place 60 mLs into feeding tube daily.   free water Soln Place 200 mLs into feeding tube every 4 (four) hours.   iron polysaccharides 150 MG capsule Commonly known as: NIFEREX Place 1 capsule (150 mg total) into feeding tube daily. What changed: how to take this   metoCLOPramide 5 MG tablet Commonly known as: Reglan Place 1 tablet (5 mg total) into feeding tube every 6 (six) hours as needed for nausea. What changed:  how to take this when to take this   mirtazapine 7.5 MG tablet Commonly known as: REMERON Place 1 tablet (7.5 mg total) into feeding tube daily. What changed:  how to take this when to take this   mouth rinse Liqd solution 15 mLs by Mouth Rinse route in the morning, at noon, in the evening, and at  bedtime.   nitroGLYCERIN 0.4 MG SL tablet Commonly known as: NITROSTAT Place 1 tablet (0.4 mg total) under the tongue every 5 (five) minutes as needed for chest pain.   omeprazole 2 mg/mL Susp oral suspension Commonly known as: KONVOMEP Place 20 mLs (40 mg total) into feeding tube daily. Replaces: omeprazole 40 MG capsule   tamsulosin 0.4 MG Caps capsule Commonly known as: FLOMAX Open capsule and put medication into feeding tube What changed:  how much to take how to take this when to take this additional instructions               Durable Medical Equipment  (From admission, onward)           Start     Ordered   08/31/22 0940  For home use only DME Suction  Once       Question:  Suction  Answer:  Oral   08/31/22 0939            Consultations: Pulmonology General surgery Palliative medicine  Procedures/Studies: 1/10-PEG tube placement by general surgery    DG Swallowing Func-Speech Pathology  Result Date: 08/28/2022 Table formatting from the original result was not included. Objective Swallowing Evaluation: Type of Study: MBS-Modified Barium Swallow Study  Patient Details Name: Sandie AnoDuncan E Kievit Sr. MRN: [id number] Date of Birth: Mar 07, 1956 Today's Date: 08/28/2022 Time: SLP Start Time (ACUTE ONLY): 1200 -SLP Stop Time (ACUTE ONLY): 1315 SLP Time Calculation (min) (ACUTE ONLY): 75 min Past Medical History: Past Medical History: Diagnosis Date  Acute CVA (cerebrovascular accident) (HCC) 06/05/2021  Acute ischemic stroke (HCC) 04/22/2022  CAP (community acquired pneumonia) 11/26/2021  Carotid arterial disease (HCC)   a. 08/2018 Carotid U/S: min-mod RICA atherosclerosis w/o hemodynamically significant stenosis. Nl LICA.  Diabetes 1.5, managed as type 2 (HCC)   Diastolic dysfunction   a. 08/2018 Echo: EF 65%. No rwma. Gr1 DD. Mild MR.  Diastolic dysfunction   a. 08/2019 Echo: EF 55-60%, Gr1 DD. No rwma. Mild MR. RVSP 37.346mmHg.  Hypercholesterolemia   Hypertension   PAF  (paroxysmal atrial fibrillation) (HCC)   a. 10/2019 Event Monitor: PAF; b. CHA2DS2VASc = 5-->Eliquis.  Poorly controlled diabetes mellitus (HCC)   a. 04/2019 A1c 13.8.  Recurrent strokes (HCC)   a. 10/2016 MRI/A: Acute 5mm R thalamic infarct, ? subacute infarct of R corona radiata; b. 08/2017 MRI/A: Acute 8mm lateral L thalamic infarct. Other more remote lacunar infarcts of thalami bilat. Small vessel dzs; c. 08/2018 MRI/A: Acute lacunar infarct of the post limb of R internal capsule; d. 08/2019 MRI Acute CVA of L paramedian pons adn R cerebellar hemisphere.  Sepsis due to pneumonia ([place]) 11/26/2021  Tobacco abuse  Past Surgical History: Past Surgical History: Procedure Laterality Date  ESOPHAGOGASTRODUODENOSCOPY N/A 08/26/2022  Procedure: ESOPHAGOGASTRODUODENOSCOPY (EGD);  Surgeon: Benjamine Sprague, DO;  Location: [facility] ENDOSCOPY;  Service: General;  Laterality: N/A;  ESOPHAGOGASTRODUODENOSCOPY (EGD) WITH PROPOFOL N/A 04/26/2019  Procedure: ESOPHAGOGASTRODUODENOSCOPY (EGD) WITH PROPOFOL;  Surgeon: Lin Landsman, MD;  Location: [facility] ENDOSCOPY;  Service: Gastroenterology;  Laterality: N/A;  LEFT HEART CATH AND CORONARY ANGIOGRAPHY N/A 01/09/2022  Procedure: LEFT HEART CATH AND CORONARY ANGIOGRAPHY;  Surgeon: Nelva Bush, MD;  Location: [place] CV LAB;  Service: Cardiovascular;  Laterality: N/A;  NO PAST SURGERIES    PEG PLACEMENT N/A 08/26/2022  Procedure: PERCUTANEOUS ENDOSCOPIC GASTROSTOMY (PEG) PLACEMENT;  Surgeon: Benjamine Sprague, DO;  Location: [facility] ENDOSCOPY;  Service: General;  Laterality: N/A;  TEE WITHOUT CARDIOVERSION N/A 04/22/2022  Procedure: TRANSESOPHAGEAL ECHOCARDIOGRAM (TEE);  Surgeon: Kate Sable, MD;  Location: [facility] ORS;  Service: Cardiovascular;  Laterality: N/A; HPI: Pt is a 67 y.o. male with medical history significant of recurrent strokes with residual left-sided deficits, known aspiration, HFpEF, CKD stage III, type 2 diabetes, bradycardia who presents to the ED due to generalized weakness.  History obtained from patient and his wife as patient has dysarthria at baseline.  Wife states that patient has been experiencing decreased appetite and generalized weakness over the last several days.  Overnight, she noticed that he was having increased difficulty with breathing in addition to a cough that sounded wet.  She was attempting to help him cough up sputum but feels that his cough is too weak.  On arrival to the ED, patient was hypotensive at 85/54 with heart rate of 41.  He was afebrile at 98.1.  He was saturating at 99% on room air.  Initial workup remarkable for WBC of 3.2, hemoglobin of 9.2 and platelets of 143.  Lactic acid within normal limits x 2.   CXR: Left greater than right bibasilar infiltrates worrisome for  pneumonia -- pt has L sided weakness lying in chair/bed.  Subjective: pt awake, followed 1-step commands. Verbal but dysarthric from previous CVAs. Contracted LUE.  Recommendations for follow up therapy are one component of a multi-disciplinary discharge planning process, led by the attending physician.  Recommendations may be updated based on patient status, additional functional criteria and insurance authorization. Assessment / Plan / Recommendation   08/24/2022   5:00 PM Clinical Impressions Clinical Impression Pt presents w/ severe oropharyngeal phase dsyphagia w/ sensorimotor deficits. This resulted in SILENT aspiration of thin and Nectar liquids; MODERATE+ delay in pharyngeal swallow w/ Honey consistency liquids and Puree; MUCH modified foods/liquid which may jeopardize pt's ability to meet hydration/nutrition needs adequately.  Pt also exhibits Moderate oral phase deficits c/b decreased lingual strength and awareness of bolus residue, decreased bolus cohesion/control w/ premature spillage of liquids into the pharynx but slow bolus A-P transfer of the increased texture trials; reduced oral clearing of oral residue b/t trials thus increasing risk for aspiration after the swallow from  oral residue coating the pharynx and entering the laryngeal vestibule. SLP Visit Diagnosis Dysphagia, oropharyngeal phase (R13.12) Impact on safety and function Severe aspiration risk;Risk for inadequate nutrition/hydration     08/24/2022   5:00 PM Treatment Recommendations Treatment Recommendations Defer treatment plan to f/u with SLP     08/24/2022   5:00 PM Prognosis Prognosis for Safe Diet Advancement Guarded Barriers to Reach Goals Cognitive deficits;Language deficits;Time post onset;Severity of deficits Barriers/Prognosis Comment baseline CVAs and Dysphagia   08/24/2022   5:00  PM Diet Recommendations SLP Diet Recommendations NPO;Alternative means - long-term Medication Administration Via alternative means     08/24/2022   5:00 PM Other Recommendations Recommended Consults -- Oral Care Recommendations Oral care QID;Staff/trained caregiver to provide oral care Follow Up Recommendations Home health SLP Functional Status Assessment Patient has had a recent decline in their functional status and/or demonstrates limited ability to make significant improvements in function in a reasonable and predictable amount of time   08/24/2022   5:00 PM Frequency and Duration  Speech Therapy Frequency (ACUTE ONLY) -- Treatment Duration --     08/24/2022   5:00 PM Oral Phase Oral Phase Impaired Oral - Honey Teaspoon Lingual/palatal residue;Decreased bolus cohesion;Delayed oral transit;Weak lingual manipulation Oral - Nectar Teaspoon Lingual/palatal residue;Decreased bolus cohesion;Premature spillage Oral - Nectar Cup Lingual/palatal residue;Delayed oral transit;Decreased bolus cohesion Oral - Thin Teaspoon Lingual/palatal residue;Decreased bolus cohesion;Premature spillage Oral - Puree Weak lingual manipulation;Lingual/palatal residue;Delayed oral transit;Decreased bolus cohesion Oral Phase - Comment oral residue noted post all trials; decreased bolus control/cohesion during A-P transfer w/ premature spillage into the pharynx. Slow,  deliberate/weak lingual manipulation. Adequate bolus control during Hold instruction.    08/24/2022   5:00 PM Pharyngeal Phase Pharyngeal Phase Impaired Pharyngeal- Honey Teaspoon Delayed swallow initiation-vallecula Pharyngeal- Nectar Teaspoon Delayed swallow initiation-pyriform sinuses;Reduced airway/laryngeal closure;Penetration/Aspiration during swallow;Significant aspiration (Amount) Pharyngeal- Nectar Straw Delayed swallow initiation-pyriform sinuses;Reduced airway/laryngeal closure;Penetration/Aspiration during swallow;Significant aspiration (Amount) Pharyngeal- Thin Teaspoon Delayed swallow initiation-pyriform sinuses;Reduced airway/laryngeal closure;Penetration/Aspiration before swallow;Significant aspiration (Amount) Pharyngeal- Puree Delayed swallow initiation-vallecula Pharyngeal Comment SEVERELY Delayed pharyngeal swallow initiation w/ thin and Nectar liquid consistencies filling the pyriform sinuses b/f a pharyngeal swallow initiated. This resulted in SILENT aspiration before and during the swallow; decreased airway closure and protection. Trials of Honey liquids and Puree triggered the pharyngeal swallow at/spilling from the Valleculae post Time (significant delay). Adequate pharyngeal pressure and laryngeal excursion w/ no significant bolus residue remaining in the pharynx.  PT WAS UNABLE TO EFFECTIVELY COUGH TO CLEAR THE ASPIRATED MATERIALS WHEN CUED.    08/24/2022   5:00 PM Cervical Esophageal Phase  Cervical Esophageal Phase [facility] Happi Overton 08/28/2022, 9:35 PM                     DG Chest 2 View  Result Date: 08/21/2022 CLINICAL DATA:  Suspected sepsis EXAM: CHEST - 2 VIEW COMPARISON:  April 21, 2022 and June 22, 2022 chest x-rays FINDINGS: Increased infiltrate in the left base in the interval. More mild infiltrate in the right base. Stable cardiomegaly. The hila and mediastinum are unremarkable. No pneumothorax. No other changes. IMPRESSION: Left greater than right bibasilar infiltrates  worrisome for pneumonia or aspiration. Recommend short-term follow-up imaging to ensure resolution. Electronically Signed   By: Gerome Sam III M.D.   On: 08/21/2022 13:56       The results of significant diagnostics from this hospitalization (including imaging, microbiology, ancillary and laboratory) are listed below for reference.     Microbiology: Recent Results (from the past 240 hour(s))  Resp panel by RT-PCR (RSV, Flu A&B, Covid) Anterior Nasal Swab     Status: None   Collection Time: 08/21/22  3:47 PM   Specimen: Anterior Nasal Swab  Result Value Ref Range Status   SARS Coronavirus 2 by RT PCR NEGATIVE NEGATIVE Final    Comment: (NOTE) SARS-CoV-2 target nucleic acids are NOT DETECTED.  The SARS-CoV-2 RNA is generally detectable in upper respiratory specimens during the acute phase of infection. The lowest concentration of SARS-CoV-2 viral copies this  assay can detect is 138 copies/mL. A negative result does not preclude SARS-Cov-2 infection and should not be used as the sole basis for treatment or other patient management decisions. A negative result may occur with  improper specimen collection/handling, submission of specimen other than nasopharyngeal swab, presence of viral mutation(s) within the areas targeted by this assay, and inadequate number of viral copies(<138 copies/mL). A negative result must be combined with clinical observations, patient history, and epidemiological information. The expected result is Negative.  Fact Sheet for Patients:  BloggerCourse.com  Fact Sheet for Healthcare Providers:  SeriousBroker.it  This test is no t yet approved or cleared by the [place] FDA and  has been authorized for detection and/or diagnosis of SARS-CoV-2 by FDA under an Emergency Use Authorization (EUA). This EUA will remain  in effect (meaning this test can be used) for the duration of the COVID-19 declaration  under Section 564(b)(1) of the Act, 21 U.S.C.section 360bbb-3(b)(1), unless the authorization is terminated  or revoked sooner.       Influenza A by PCR NEGATIVE NEGATIVE Final   Influenza B by PCR NEGATIVE NEGATIVE Final    Comment: (NOTE) The Xpert Xpress SARS-CoV-2/FLU/RSV plus assay is intended as an aid in the diagnosis of influenza from Nasopharyngeal swab specimens and should not be used as a sole basis for treatment. Nasal washings and aspirates are unacceptable for Xpert Xpress SARS-CoV-2/FLU/RSV testing.  Fact Sheet for Patients: BloggerCourse.com  Fact Sheet for Healthcare Providers: SeriousBroker.it  This test is not yet approved or cleared by the [place] FDA and has been authorized for detection and/or diagnosis of SARS-CoV-2 by FDA under an Emergency Use Authorization (EUA). This EUA will remain in effect (meaning this test can be used) for the duration of the COVID-19 declaration under Section 564(b)(1) of the Act, 21 U.S.C. section 360bbb-3(b)(1), unless the authorization is terminated or revoked.     Resp Syncytial Virus by PCR NEGATIVE NEGATIVE Final    Comment: (NOTE) Fact Sheet for Patients: BloggerCourse.com  Fact Sheet for Healthcare Providers: SeriousBroker.it  This test is not yet approved or cleared by the [place] FDA and has been authorized for detection and/or diagnosis of SARS-CoV-2 by FDA under an Emergency Use Authorization (EUA). This EUA will remain in effect (meaning this test can be used) for the duration of the COVID-19 declaration under Section 564(b)(1) of the Act, 21 U.S.C. section 360bbb-3(b)(1), unless the authorization is terminated or revoked.  Performed at [facility], [address] [place], [place] [address]      Labs:  CBC: Recent Labs  Lab 08/27/22 0515 08/28/22 0543 08/29/22 3875  08/30/22 0523 08/31/22 0217  WBC 6.0 6.8 8.5 9.0 11.4*  HGB 8.2* 8.2* 7.4* 7.4* 7.6*  HCT 27.0* 26.9* 24.3* 24.4* 24.9*  MCV 87.1 87.6 87.7 87.8 87.4  PLT 117* 144* 153 160 195   BMP &GFR Recent Labs  Lab 08/26/22 0357 08/26/22 1404 08/27/22 0515 08/27/22 2101 08/28/22 0543 08/29/22 0621 08/30/22 0523 08/31/22 0217  NA 146*  --  145  --  145 147* 145 142  K 4.0  --  4.1  --  4.3 4.2 4.7 5.1  CL 117*  --  116*  --  115* 116* 115* 111  CO2 22  --  19*  --  22 23 24 23   GLUCOSE 130*  --  69*  --  161* 83 134* 127*  BUN 14  --  14  --  24* 29* 34* 32*  CREATININE  1.93*  --  1.95*  --  2.11* 2.26* 2.29* 2.05*  CALCIUM 8.9  --  8.7*  --  8.4* 8.5* 8.3* 8.7*  MG 1.7 1.8 1.8 1.8  --  2.2 2.4 2.3  PHOS 3.7 3.9 3.6 3.6  --   --   --  3.4   Estimated Creatinine Clearance: 32.2 mL/min (A) (by C-G formula based on SCr of 2.05 mg/dL (H)). Liver & Pancreas: Recent Labs  Lab 08/31/22 0217  ALBUMIN 2.8*   No results for input(s): "LIPASE", "AMYLASE" in the last 168 hours. No results for input(s): "AMMONIA" in the last 168 hours. Diabetic: Recent Labs    08/30/22 0522  HGBA1C 6.3*   Recent Labs  Lab 08/30/22 2008 08/31/22 0015 08/31/22 0434 08/31/22 0800 08/31/22 1246  GLUCAP 109* 111* 116* 112* 188*   Cardiac Enzymes: No results for input(s): "CKTOTAL", "CKMB", "CKMBINDEX", "TROPONINI" in the last 168 hours. No results for input(s): "PROBNP" in the last 8760 hours. Coagulation Profile: No results for input(s): "INR", "PROTIME" in the last 168 hours. Thyroid Function Tests: No results for input(s): "TSH", "T4TOTAL", "FREET4", "T3FREE", "THYROIDAB" in the last 72 hours. Lipid Profile: No results for input(s): "CHOL", "HDL", "LDLCALC", "TRIG", "CHOLHDL", "LDLDIRECT" in the last 72 hours. Anemia Panel: Recent Labs    08/31/22 0217  VITAMINB12 714  FOLATE 19.2  FERRITIN 425*  TIBC 216*  IRON 17*  RETICCTPCT 1.2   Urine analysis:    Component Value Date/Time    COLORURINE YELLOW (A) 06/22/2022 1722   APPEARANCEUR Turbid (A) 07/17/2022 1050   LABSPEC 1.012 06/22/2022 1722   PHURINE 5.0 06/22/2022 1722   GLUCOSEU 1+ (A) 07/17/2022 1050   HGBUR SMALL (A) 06/22/2022 1722   BILIRUBINUR Negative 07/17/2022 1050   KETONESUR NEGATIVE 06/22/2022 1722   PROTEINUR 3+ (A) 07/17/2022 1050   PROTEINUR >=300 (A) 06/22/2022 1722   NITRITE Negative 07/17/2022 1050   NITRITE NEGATIVE 06/22/2022 1722   LEUKOCYTESUR 1+ (A) 07/17/2022 1050   LEUKOCYTESUR NEGATIVE 06/22/2022 1722   Sepsis Labs: Invalid input(s): "PROCALCITONIN", "LACTICIDVEN"   SIGNED:  Almon Herculesaye T Tayquan Gassman, MD  [facility] 08/31/2022, 1:27 PM

## 2022-08-31 NOTE — Care Management Important Message (Signed)
Important Message  Patient Details  Name: Evan SEDA Sr. MRN: [id number] Date of Birth: 1956-08-15   Medicare Important Message Given:  Yes     Juliann Pulse A Merlyn Bollen 08/31/2022, 10:57 AM

## 2022-08-31 NOTE — Progress Notes (Signed)
Called wife Marin Milley at [phone] and went over discharge instructions, home meds, prescriptions and follow up appointments with her and she verbalized understanding.  Patient discharged with cell phone, clothing and supplies for tube feeding.

## 2022-08-31 NOTE — TOC Transition Note (Addendum)
Transition of Care [facility]) - CM/SW Discharge Note   Patient Details  Name: Evan BUDNEY Sr. MRN: [id number] Date of Birth: Feb 28, 1956  Transition of Care [facility]) CM/SW Contact:  Tiburcio Bash, LCSW Phone Number: 08/31/2022, 10:36 AM   Clinical Narrative:     Patient to dc home today with home health RN PT and aide services, Malachy Mood with [facility] informed of discharge. DME orders for suction in, CSW has ordered via Suanne Marker with [employer].   Patient's wife states patient has all DME including a hospital bed.  Patient's wife states patient also has an aide coming out 5 days a week through [facility] [employer].  NP also recommends outpatient palliative care, per [facility] with [facility] patient is not eligible for Outpatient Palliative.   Ems has been called, forms on chart. No further dc needs.   No further dc needs identified at this time.   Final next level of care: Home w Home Health Services Barriers to Discharge: No Barriers Identified   Patient Goals and CMS Choice CMS Medicare.gov Compare Post Acute Care list provided to:: Patient Choice offered to / list presented to : Patient  Discharge Placement                         Discharge Plan and Services Additional resources added to the After Visit Summary for                  DME Arranged: Suction DME Agency: [facility] Date DME Agency Contacted: 08/31/22 Time DME Agency Contacted: 2025 Representative spoke with at DME Agency: Suanne Marker HH Arranged: RN, Nurse's Aide, PT [facility] Agency: [place] Date [place]: 08/31/22 Time [place]: 1036 Representative spoke with at [place]: [place] ([place]) Interventions [place]: No Food Insecurity (07/27/2022)  Housing: Low Risk  (07/27/2022)  Transportation Needs: No Transportation Needs (07/27/2022)  Utilities: Not At Risk (07/27/2022)  Alcohol Screen: Low Risk  (03/08/2020)   Depression (PHQ2-9): High Risk (04/06/2022)  Financial Resource Strain: Low Risk  (04/30/2022)  Physical Activity: Unknown (11/25/2018)  Social Connections: Moderately Integrated (01/13/2018)  Stress: No Stress Concern Present (11/25/2018)  Tobacco Use: Medium Risk (08/27/2022)     Readmission Risk Interventions     No data to display

## 2022-09-01 ENCOUNTER — Ambulatory Visit: Payer: Medicare Other | Admitting: Urology

## 2022-09-01 ENCOUNTER — Telehealth: Payer: Self-pay | Admitting: *Deleted

## 2022-09-01 ENCOUNTER — Encounter: Payer: Medicare Other | Admitting: Occupational Therapy

## 2022-09-01 ENCOUNTER — Encounter: Payer: Self-pay | Admitting: *Deleted

## 2022-09-01 ENCOUNTER — Ambulatory Visit: Payer: Medicare Other

## 2022-09-01 DIAGNOSIS — D631 Anemia in chronic kidney disease: Secondary | ICD-10-CM | POA: Diagnosis not present

## 2022-09-01 DIAGNOSIS — Z9181 History of falling: Secondary | ICD-10-CM | POA: Diagnosis not present

## 2022-09-01 DIAGNOSIS — E1122 Type 2 diabetes mellitus with diabetic chronic kidney disease: Secondary | ICD-10-CM | POA: Diagnosis not present

## 2022-09-01 DIAGNOSIS — I4891 Unspecified atrial fibrillation: Secondary | ICD-10-CM

## 2022-09-01 DIAGNOSIS — R1310 Dysphagia, unspecified: Secondary | ICD-10-CM | POA: Diagnosis not present

## 2022-09-01 DIAGNOSIS — N1831 Chronic kidney disease, stage 3a: Secondary | ICD-10-CM | POA: Diagnosis not present

## 2022-09-01 DIAGNOSIS — Z7984 Long term (current) use of oral hypoglycemic drugs: Secondary | ICD-10-CM | POA: Diagnosis not present

## 2022-09-01 DIAGNOSIS — J690 Pneumonitis due to inhalation of food and vomit: Secondary | ICD-10-CM | POA: Diagnosis not present

## 2022-09-01 DIAGNOSIS — I69391 Dysphagia following cerebral infarction: Secondary | ICD-10-CM | POA: Diagnosis not present

## 2022-09-01 DIAGNOSIS — I129 Hypertensive chronic kidney disease with stage 1 through stage 4 chronic kidney disease, or unspecified chronic kidney disease: Secondary | ICD-10-CM | POA: Diagnosis not present

## 2022-09-01 DIAGNOSIS — E43 Unspecified severe protein-calorie malnutrition: Secondary | ICD-10-CM | POA: Diagnosis not present

## 2022-09-01 DIAGNOSIS — Z931 Gastrostomy status: Secondary | ICD-10-CM | POA: Diagnosis not present

## 2022-09-01 DIAGNOSIS — Z7901 Long term (current) use of anticoagulants: Secondary | ICD-10-CM | POA: Diagnosis not present

## 2022-09-01 DIAGNOSIS — I69354 Hemiplegia and hemiparesis following cerebral infarction affecting left non-dominant side: Secondary | ICD-10-CM | POA: Diagnosis not present

## 2022-09-01 DIAGNOSIS — I69322 Dysarthria following cerebral infarction: Secondary | ICD-10-CM | POA: Diagnosis not present

## 2022-09-01 DIAGNOSIS — Z431 Encounter for attention to gastrostomy: Secondary | ICD-10-CM | POA: Diagnosis not present

## 2022-09-01 DIAGNOSIS — I480 Paroxysmal atrial fibrillation: Secondary | ICD-10-CM | POA: Diagnosis not present

## 2022-09-01 NOTE — Patient Outreach (Signed)
  Care Coordination   Follow Up Visit Note   09/01/2022 Name: Evan FEES Sr. MRN: [id number] DOB: 1956-01-11  Evan Cones Sr. is a 67 y.o. year old male who sees Jon Billings, NP for primary care. I spoke with  Evan Cones Sr. by phone today.  What matters to the patients health and wellness today?  CAP Services    Goals Addressed             This Visit's Progress    Per spouse "I would like to be paid to care for my husband"       Care Coordination Interventions: Follow up phone call to patient's spouse who confirmed  that patient has been discharged from the hospital with [facility] services through [facility] Patient's spouse confirms that she was contacted by [facility] and patient has been scheduled for an assessment for CAPS for 09/09/22 at 12:00pm to start services however spouse would like an earlier  Spouse now asking if assessment can be moved up, patient's spouse provided with the contact number again for [facility] to see if they would be able to move the assessment to an earlier date [phone] Patient's spouse encouraged to call this social worker with any additional community resource needs-CSW to follow up in approximately 1 month           SDOH assessments and interventions completed:  No     Care Coordination Interventions:  Yes, provided   Follow up plan: Follow up call scheduled for 09/07/22    Encounter Outcome:  Pt. Visit Completed

## 2022-09-01 NOTE — Patient Outreach (Signed)
  Care Coordination   Follow Up Visit Note   09/01/2022 Name: Evan KERWIN Sr. MRN: [id number] DOB: 12/23/1955  Evan Cones Sr. is a 67 y.o. year old male who sees Jon Billings, NP for primary care. I     What matters to the patients health and wellness today?  Patient admitted to hospital 1/5-1/15, discharged home with wife and home health.  Referral placed to Managed Medicaid team.     Goals Addressed             This Visit's Progress    COMPLETED: Management of chronic medical conditions   Not on track    Care Coordination Interventions: Evaluation of current treatment plan related to A-fib, residual from stroke, cystitis and patient's adherence to plan as established by provider Advised patient to notify provider if she finds more blood in urine Collaborated with CSW regarding wife getting paid through CAPS to care for patient Reviewed scheduled/upcoming provider appointments including Urology on 1/16 and cardiology on 1/17 Care Guide referral for difficulty paying utility bills Discussed plans with patient for ongoing care management follow up and provided patient with direct contact information for care management team Per wife, patient will be getting feeding tube placed due to recurrent episodes of aspiration pneumonia. Collaborated with CSW regarding plan of care once patient is discharged  1/16 - order placed for Managed Medicaid care management team         SDOH assessments and interventions completed:  No     Care Coordination Interventions:  Yes, provided   Follow up plan: Referral made to managed medicaid    Encounter Outcome:  Pt. Visit Completed   Valente David, RN, MSN, [place] Care Management Care Management Coordinator [phone]

## 2022-09-01 NOTE — Patient Instructions (Signed)
Visit Information  Thank you for taking time to visit with me today. Please don't hesitate to contact me if I can be of assistance to you.   Following are the goals we discussed today:   Goals Addressed             This Visit's Progress    Per spouse "I would like to be paid to care for my husband"       Care Coordination Interventions: Follow up phone call to patient's spouse who confirmed  that patient has been discharged from the hospital with [facility] services through [facility] Patient's spouse confirms that she was contacted by [facility] and patient has been scheduled for an assessment for CAPS for 09/09/22 at 12:00pm to start services however spouse would like an earlier  Spouse now asking if assessment can be moved up, patient's spouse provided with the contact number again for [facility] to see if they would be able to move the assessment to an earlier date [phone] Patient's spouse encouraged to call this social worker with any additional community resource needs-CSW to follow up in approximately 1 month           Our next appointment is by telephone on 09/07/22 at 1pm  Please call the care guide team at [phone] if you need to cancel or reschedule your appointment.   If you are experiencing a Mental Health or [place] or need someone to talk to, please call 911   Patient verbalizes understanding of instructions and care plan provided today and agrees to view in [place]. Active MyChart status and patient understanding of how to access instructions and care plan via MyChart confirmed with patient.     Telephone follow up appointment with care management team member scheduled for: 09/07/22  Elliot Gurney, [place] Worker  [facility] Care Management [phone]

## 2022-09-01 NOTE — Patient Outreach (Signed)
  Care Coordination [facility] Note Transition Care Management Unsuccessful Follow-up Telephone Call  Date of discharge and from where:  [facility] [phone] Aspiration pneumonia  Attempts:  1st Attempt  Reason for unsuccessful TCM follow-up call:  Voice mail full  [place] Management [phone]

## 2022-09-01 NOTE — Patient Outreach (Signed)
  Care Coordination [facility] Note Transition Care Management Follow-up Telephone Call Date of discharge and from where: [facility] [phone] How have you been since you were released from the hospital? Doing ok Any questions or concerns?  Yes I need supplies like dressings, water for his feeding..  Items Reviewed: Did the pt receive and understand the discharge instructions provided? Yes  Medications obtained and verified? Yes  Other? No  Any new allergies since your discharge? No  Dietary orders reviewed? No Do you have support at home? Yes   Home Care and Equipment/Supplies: Were home health services ordered? Yes PT/OT and aide service/ Aide also by [facility] If so, what is the name of the agency? [facility] Has the agency set up a time to come to the patient's home? yes Were any new equipment or medical supplies ordered?  Yes: Suction What is the name of the medical supply agency? [facility] Were you able to get the supplies/equipment? yes Do you have any questions related to the use of the equipment or supplies? No  Functional Questionnaire: (I = Independent and D = Dependent) ADLs: D  Bathing/Dressing- D  Meal Prep- D  Eating- D  Maintaining continence- D  Transferring/Ambulation- D  Managing Meds- D  Follow up appointments reviewed:  PCP Hospital f/u appt confirmed? Yes  Scheduled to see Jon Billings NP [phone]  1:00 Specialist [facility] f/u appt confirmed? No  . Are transportation arrangements needed? No  If their condition worsens, is the pt aware to call PCP or go to the Emergency Dept.? Yes Was the patient provided with contact information for the PCP's office or ED? Yes Was to pt encouraged to call back with questions or concerns? Yes  SDOH assessments and interventions completed:   Yes   Care Coordination Interventions:  Patient  is being followed by [facility] Coordinator and [facility] Social Worker. Patient will be talking to  the Home health agency RN when they come today regarding the supplies needed. THN Tip this will not be part of the note when signed-REQUIRED REPORT FIELD DO NOT DELETE (Optional):[id number]}  Encounter Outcome:  Pt. Visit Completed    [place] Management [phone]

## 2022-09-02 ENCOUNTER — Encounter: Payer: Medicare Other | Admitting: Occupational Therapy

## 2022-09-02 ENCOUNTER — Ambulatory Visit: Payer: Medicare Other

## 2022-09-02 ENCOUNTER — Telehealth: Payer: Self-pay | Admitting: Nurse Practitioner

## 2022-09-02 ENCOUNTER — Encounter: Payer: Medicare Other | Admitting: Speech Pathology

## 2022-09-02 ENCOUNTER — Ambulatory Visit: Payer: 59 | Attending: Internal Medicine | Admitting: Internal Medicine

## 2022-09-02 NOTE — Telephone Encounter (Signed)
This encounter was created in error - please disregard.

## 2022-09-02 NOTE — Telephone Encounter (Signed)
Home Health Verbal Orders - Caller/Agency: Manuela Schwartz with [facility] Number: [phone] Requesting OT/PT/Skilled Nursing//Speech Therapy. Skilled nursing for pegg tube feeding and medication management Frequency: Skilled Nursing 1 wk 6  Please assist further

## 2022-09-02 NOTE — Progress Notes (Deleted)
Follow-up Outpatient Visit Date: 09/02/2022  Primary Care Provider: Jon Billings, NP [place] [address]  Chief Complaint: ***  HPI:  [name] is a 67 y.o. male with history of paroxysmal atrial fibrillation, recurrent strokes with carotid artery disease, hypertension, hyperlipidemia, type 2 diabetes mellitus, and tobacco abuse, who presents for follow-up of atrial fibrillation and hypertension.  He was last seen in our office in October by Tarri Glenn, [place], at which time he was noted to have residual right-sided weakness.  He denied chest pain, shortness of breath, and edema.  He was restarted on dapagliflozin, which had previously been stopped for unclear reasons.  He was continued on apixaban, amlodipine, and carvedilol.  He was hospitalized earlier this month with aspiration pneumonia complicated by septic shock and acute kidney injury.  He was discharged 2 days ago.  --------------------------------------------------------------------------------------------------  Past Medical History:  Diagnosis Date   Acute CVA (cerebrovascular accident) ([place]) 06/05/2021   Acute ischemic stroke ([place]) 04/22/2022   CAP (community acquired pneumonia) 11/26/2021   Carotid arterial disease ([place])    a. 08/2018 Carotid U/S: min-mod RICA atherosclerosis w/o hemodynamically significant stenosis. Nl LICA.   Diabetes 1.5, managed as type 2 ([place])    Diastolic dysfunction    a. 08/2018 Echo: EF 65%. No rwma. Gr1 DD. Mild MR.   Diastolic dysfunction    a. 08/2019 Echo: EF 55-60%, Gr1 DD. No rwma. Mild MR. RVSP 37.15mHg.   Hypercholesterolemia    Hypertension    PAF (paroxysmal atrial fibrillation) ([place]    a. 10/2019 Event Monitor: PAF; b. CHA2DS2VASc = 5-->Eliquis.   Poorly controlled diabetes mellitus ([place]    a. 04/2019 A1c 13.8.   Recurrent strokes ([place]    a. 10/2016 MRI/A: Acute 567mR thalamic infarct, ? subacute infarct of R corona radiata; b. 08/2017 MRI/A: Acute 48m25materal L  thalamic infarct. Other more remote lacunar infarcts of thalami bilat. Small vessel dzs; c. 08/2018 MRI/A: Acute lacunar infarct of the post limb of R internal capsule; d. 08/2019 MRI Acute CVA of L paramedian pons adn R cerebellar hemisphere.   Sepsis due to pneumonia ([place]/07/2022   Tobacco abuse    Past Surgical History:  Procedure Laterality Date   ESOPHAGOGASTRODUODENOSCOPY N/A 08/26/2022   Procedure: ESOPHAGOGASTRODUODENOSCOPY (EGD);  Surgeon: SakBenjamine SpragueO;  Location: [facility] ENDOSCOPY;  Service: General;  Laterality: N/A;   ESOPHAGOGASTRODUODENOSCOPY (EGD) WITH PROPOFOL N/A 04/26/2019   Procedure: ESOPHAGOGASTRODUODENOSCOPY (EGD) WITH PROPOFOL;  Surgeon: VanLin LandsmanD;  [facility];  Service: Gastroenterology;  Laterality: N/A;   LEFT HEART CATH AND CORONARY ANGIOGRAPHY N/A 01/09/2022   Procedure: LEFT HEART CATH AND CORONARY ANGIOGRAPHY;  Surgeon: EndNelva BushD;  [facility];  Service: Cardiovascular;  Laterality: N/A;   NO PAST SURGERIES     PEG PLACEMENT N/A 08/26/2022   Procedure: PERCUTANEOUS ENDOSCOPIC GASTROSTOMY (PEG) PLACEMENT;  Surgeon: SakBenjamine SpragueO;  Location: [facility] ENDOSCOPY;  Service: General;  Laterality: N/A;   TEE WITHOUT CARDIOVERSION N/A 04/22/2022   Procedure: TRANSESOPHAGEAL ECHOCARDIOGRAM (TEE);  Surgeon: AgbKate SableD;  Location: [facility] ORS;  Service: Cardiovascular;  Laterality: N/A;    No outpatient medications have been marked as taking for the 09/02/22 encounter (Appointment) with Amberrose Friebel, ChrHarrell GaveD.    Allergies: Patient has no known allergies.  Social History   Tobacco Use   Smoking status: Former    Packs/day: 1.00    Years: 38.00    Total pack years: 38.00    Types:  Cigarettes    Start date: 1982   Smokeless tobacco: Never  Vaping Use   Vaping Use: Never used  Substance Use Topics   Alcohol use: Not Currently    Alcohol/week: 1.0 standard drink of alcohol    Types: 1 Cans of beer per  week    Comment: previously drank but nothing in 1-2 yrs (08/2019).   Drug use: Not Currently    Types: Cocaine, Marijuana    Comment: prev used cocaine/marijuana but none x 1-2 yrs (08/2019).    Family History  Problem Relation Age of Onset   Hypertension Mother    Stroke Mother        died @ age 51   Hypertension Father    Diabetes Father    Heart attack Father        died @ 33    Review of Systems: A 12-system review of systems was performed and was negative except as noted in the HPI.  --------------------------------------------------------------------------------------------------  Physical Exam: There were no vitals taken for this visit.  General:  NAD. Neck: No JVD or HJR. Lungs: Clear to auscultation bilaterally without wheezes or crackles. Heart: Regular rate and rhythm without murmurs, rubs, or gallops. Abdomen: Soft, nontender, nondistended. Extremities: No lower extremity edema.  EKG:  ***  Lab Results  Component Value Date   WBC 11.4 (H) 08/31/2022   HGB 7.6 (L) 08/31/2022   HCT 24.9 (L) 08/31/2022   MCV 87.4 08/31/2022   PLT 195 08/31/2022    Lab Results  Component Value Date   NA 142 08/31/2022   K 5.1 08/31/2022   CL 111 08/31/2022   CO2 23 08/31/2022   BUN 32 (H) 08/31/2022   CREATININE 2.05 (H) 08/31/2022   GLUCOSE 127 (H) 08/31/2022   ALT 102 (H) 08/24/2022    Lab Results  Component Value Date   CHOL 128 04/22/2022   HDL 47 04/22/2022   LDLCALC 63 04/22/2022   TRIG 91 04/22/2022   CHOLHDL 2.7 04/22/2022    --------------------------------------------------------------------------------------------------  ASSESSMENT AND PLAN: Harrell Gave Daveion Robar, MD 09/02/2022 8:21 AM

## 2022-09-02 NOTE — Telephone Encounter (Signed)
Called and gave verbal orders per Karen.  

## 2022-09-02 NOTE — Telephone Encounter (Signed)
Okay for verbal order

## 2022-09-03 ENCOUNTER — Ambulatory Visit: Payer: Self-pay | Admitting: *Deleted

## 2022-09-03 ENCOUNTER — Encounter: Payer: Self-pay | Admitting: Internal Medicine

## 2022-09-03 DIAGNOSIS — E43 Unspecified severe protein-calorie malnutrition: Secondary | ICD-10-CM | POA: Diagnosis not present

## 2022-09-03 DIAGNOSIS — Z7901 Long term (current) use of anticoagulants: Secondary | ICD-10-CM | POA: Diagnosis not present

## 2022-09-03 DIAGNOSIS — Z9181 History of falling: Secondary | ICD-10-CM | POA: Diagnosis not present

## 2022-09-03 DIAGNOSIS — I69354 Hemiplegia and hemiparesis following cerebral infarction affecting left non-dominant side: Secondary | ICD-10-CM | POA: Diagnosis not present

## 2022-09-03 DIAGNOSIS — I69322 Dysarthria following cerebral infarction: Secondary | ICD-10-CM | POA: Diagnosis not present

## 2022-09-03 DIAGNOSIS — D631 Anemia in chronic kidney disease: Secondary | ICD-10-CM | POA: Diagnosis not present

## 2022-09-03 DIAGNOSIS — I480 Paroxysmal atrial fibrillation: Secondary | ICD-10-CM | POA: Diagnosis not present

## 2022-09-03 DIAGNOSIS — E1122 Type 2 diabetes mellitus with diabetic chronic kidney disease: Secondary | ICD-10-CM | POA: Diagnosis not present

## 2022-09-03 DIAGNOSIS — I69391 Dysphagia following cerebral infarction: Secondary | ICD-10-CM | POA: Diagnosis not present

## 2022-09-03 DIAGNOSIS — J690 Pneumonitis due to inhalation of food and vomit: Secondary | ICD-10-CM | POA: Diagnosis not present

## 2022-09-03 DIAGNOSIS — Z431 Encounter for attention to gastrostomy: Secondary | ICD-10-CM | POA: Diagnosis not present

## 2022-09-03 DIAGNOSIS — R1310 Dysphagia, unspecified: Secondary | ICD-10-CM | POA: Diagnosis not present

## 2022-09-03 DIAGNOSIS — N1831 Chronic kidney disease, stage 3a: Secondary | ICD-10-CM | POA: Diagnosis not present

## 2022-09-03 DIAGNOSIS — Z7984 Long term (current) use of oral hypoglycemic drugs: Secondary | ICD-10-CM | POA: Diagnosis not present

## 2022-09-03 DIAGNOSIS — Z931 Gastrostomy status: Secondary | ICD-10-CM | POA: Diagnosis not present

## 2022-09-03 DIAGNOSIS — I129 Hypertensive chronic kidney disease with stage 1 through stage 4 chronic kidney disease, or unspecified chronic kidney disease: Secondary | ICD-10-CM | POA: Diagnosis not present

## 2022-09-03 NOTE — Telephone Encounter (Signed)
Summary: blood sugar over 200   Patients wife Rise Paganini states that the patients blood sugar is staying over 200 and he is sleeping a lot. She is wanting to know what to do for him. Rise Paganini states she keep giving him water. Please advise       Reason for Disposition  Blood glucose 70-240 mg/dL (3.9 -13.3 mmol/L)  Answer Assessment - Initial Assessment Questions 1. BLOOD GLUCOSE: "What is your blood glucose level?"      200 2. ONSET: "When did you check the blood glucose?"     Today 2:00  3. USUAL RANGE: "What is your glucose level usually?" (e.g., usual fasting morning value, usual evening value)     70-80, varies 4. KETONES: "Do you check for ketones (urine or blood test strips)?" If Yes, ask: "What does the test show now?"      na 5. TYPE 1 or 2:  "Do you know what type of diabetes you have?"  (e.g., Type 1, Type 2, Gestational; doesn't know)      type2 6. INSULIN: "Do you take insulin?" "What type of insulin(s) do you use? What is the mode of delivery? (syringe, pen; injection or pump)?"      Not on insulin - stopped by Dr Neomia Dear 7. DIABETES PILLS: "Do you take any pills for your diabetes?" If Yes, ask: "Have you missed taking any pills recently?"     no 8. OTHER SYMPTOMS: "Do you have any symptoms?" (e.g., fever, frequent urination, difficulty breathing, dizziness, weakness, vomiting)     no  Protocols used: Diabetes - High Blood Sugar-A-AH

## 2022-09-03 NOTE — Telephone Encounter (Signed)
Patient's A1c is well controlled at 6.3.  I understand that sugars are high right now but that is likely due to acute stress from aspiration pneumonia and recent g tube placement.  I recommend he come in for a hospital follow and we can discuss this further.

## 2022-09-03 NOTE — Telephone Encounter (Signed)
  Chief Complaint: elevated glucose- medication request Symptoms: elevated glucose reading- 205 Frequency: today- wife reports normal range- most of the time- has seen elevation last couple days- and she is concerned about this reading Pertinent Negatives: Patient denies other symptoms Disposition: [] ED /[] Urgent Care (no appt availability in office) / [] Appointment(In office/virtual)/ []  [place] [facility]/ [] Home Care/ [] Refused Recommended Disposition /[] [place] [facility]/ [x]  Follow-up with PCP Additional Notes: Patient's wife is calling to request insulin to have on hand if patient level should go up as it is today. Patient insulin was discontinued by PCP- Dr Neomia Dear. Wife is requesting a "pen" and instructions on sliding scale for patient- she reports he does not take anything for his glucose now and she believes he needs something. Today PT is at home and she is increasing fluids for patient.

## 2022-09-03 NOTE — Telephone Encounter (Signed)
Tried calling patient's wife, no answer and VM full. Will try to call again.   OK for PEC to give message from the provider if the patient's wife calls back.

## 2022-09-04 NOTE — Telephone Encounter (Signed)
Patient's wife notified of Karen's message. Has hospital f/up scheduled for 08/29/2022.

## 2022-09-07 ENCOUNTER — Encounter: Payer: Self-pay | Admitting: *Deleted

## 2022-09-07 ENCOUNTER — Telehealth: Payer: Self-pay | Admitting: Nurse Practitioner

## 2022-09-07 ENCOUNTER — Telehealth: Payer: Self-pay | Admitting: *Deleted

## 2022-09-07 NOTE — Telephone Encounter (Signed)
Copied from [place] [id number]. Topic: General - Other >> Sep 07, 2022  2:43 PM Sabas Sous wrote: Reason for CRM: Pt's wife called upset about resources that she says are missing that were supposed to have been handled during the patient's last visit. Please advise Best contact: [phone]

## 2022-09-07 NOTE — Patient Instructions (Signed)
Visit Information  Thank you for taking time to visit with me today. Please don't hesitate to contact me if I can be of assistance to you.   Following are the goals we discussed today:   Goals Addressed             This Visit's Progress    Per spouse "I would like to be paid to care for my husband"       Care Coordination Interventions: Phone call from patient's spouse stating that she was in need of tube feeding for spouse Collaboration phone call to [facility] who agreed to contact patient's [facility] agency as well as patient's provider to request orders for tube feeding Patient's spouse continues confirmsthat patient has been scheduled for an assessment for CAPS for 09/09/22 at 12:00pm  Patient's spouse encouraged to call this social worker with any additional community resource needs-CSW to follow up  within 1 month regarding status of CAPS services          Our next appointment is by telephone on 09/18/22 at 1pm  Please call the care guide team at [phone] if you need to cancel or reschedule your appointment.   If you are experiencing a Mental Health or [place] or need someone to talk to, please call 911   Patient verbalizes understanding of instructions and care plan provided today and agrees to view in [place]. Active MyChart status and patient understanding of how to access instructions and care plan via MyChart confirmed with patient.     Telephone follow up appointment with care management team member scheduled for: 09/18/22  Elliot Gurney, [place] Worker  [facility] Care Management [phone]

## 2022-09-07 NOTE — Patient Outreach (Signed)
  Care Coordination   Follow Up Visit Note   09/07/2022 Name: Evan KAMEL Sr. MRN: [id number] DOB: Sep 10, 1955  Evan Cones Sr. is a 67 y.o. year old male who sees Jon Billings, NP for primary care. I spoke with  Evan Cones Sr. by phone today.  What matters to the patients health and wellness today?  Tube feedings/CAP services    Goals Addressed             This Visit's Progress    Per spouse "I would like to be paid to care for my husband"       Care Coordination Interventions: Phone call from patient's spouse stating that she was in need of tube feeding for spouse Collaboration phone call to [facility] who agreed to contact patient's [facility] agency as well as patient's provider to request orders for tube feeding Patient's spouse continues confirmsthat patient has been scheduled for an assessment for CAPS for 09/09/22 at 12:00pm  Patient's spouse encouraged to call this social worker with any additional community resource needs-CSW to follow up  within 1 month regarding status of CAPS services          SDOH assessments and interventions completed:  No     Care Coordination Interventions:  Yes, provided   Follow up plan: Follow up call scheduled for 09/18/22    Encounter Outcome:  Pt. Visit Completed

## 2022-09-07 NOTE — Patient Outreach (Addendum)
  Care Coordination   Follow Up Visit Note   09/07/2022 Name: Evan WILSON Sr. MRN: [id number] DOB: 01/19/1956  Evan Cones Sr. is a 67 y.o. year old male who sees Jon Billings, NP for primary care. I spoke with Rise Paganini, wife of Evan RYNER Sr. by phone today.  What matters to the patients health and wellness today?  Hospitalized 1/5-1/15 for aspiration pneumonia   Update @ 1640: Wife report patient is down to last container of tube feeding.  He is to have feedings 6 times a day.  She has been using feeding provided from hospital.  Calls placed to local pharmacy, [facility], [facility], and Manuela Schwartz, RN with [facility].  Local pharmacy can provide but patient will have to pay out of pocket.  [facility] able to provide but will need to have insurance approved before providing, which can take 24-48 hours for approval.  Spoke with Manuela Schwartz with [facility], she will call their local office to see of they are able to provide until formula approved through [facility].  MD aware of collaboration, will place order to [facility] for Osmolite 1.5 6 times a day.   Goals Addressed             This Visit's Progress    Management of chronic medical conditions   Not on track    Care Coordination Interventions: Evaluation of current treatment plan related to A-fib, residual from stroke, cystitis and patient's adherence to plan as established by provider Collaborated with CSW regarding wife getting paid through [employer] to care for patient.  Appointment with DSS scheduled for 1/24 Reviewed scheduled/upcoming provider appointments including cardiology on 1/24, podiatry on 1/25, PCP on 1/31.   Discussed plans with patient for ongoing care management follow up and provided patient with direct contact information for care management team Per wife, feeding tube placed due to recurrent episodes of aspiration pneumonia.  Home Health team will be doing teaching for care of tube as well as PT/OT. Collaborated with PCP  office in regards to location of visit on 1/31 as wife state she will be unable to get patient out of the home for the appointment due to decreased mobility.  Unfortunately visit has to be in person as it is a post discharge follow up.  Wife made aware.             SDOH assessments and interventions completed:  No     Care Coordination Interventions:  Yes, provided   Follow up plan: Follow up call scheduled for 2/5    Encounter Outcome:  Pt. Visit Completed   Valente David, RN, MSN, [facility] Care Management Coordinator [phone]

## 2022-09-08 ENCOUNTER — Encounter: Payer: Medicare Other | Admitting: Occupational Therapy

## 2022-09-08 ENCOUNTER — Ambulatory Visit: Payer: Medicare Other

## 2022-09-08 DIAGNOSIS — Z931 Gastrostomy status: Secondary | ICD-10-CM | POA: Diagnosis not present

## 2022-09-08 DIAGNOSIS — Z7984 Long term (current) use of oral hypoglycemic drugs: Secondary | ICD-10-CM | POA: Diagnosis not present

## 2022-09-08 DIAGNOSIS — I129 Hypertensive chronic kidney disease with stage 1 through stage 4 chronic kidney disease, or unspecified chronic kidney disease: Secondary | ICD-10-CM | POA: Diagnosis not present

## 2022-09-08 DIAGNOSIS — Z9181 History of falling: Secondary | ICD-10-CM | POA: Diagnosis not present

## 2022-09-08 DIAGNOSIS — J690 Pneumonitis due to inhalation of food and vomit: Secondary | ICD-10-CM | POA: Diagnosis not present

## 2022-09-08 DIAGNOSIS — Z7901 Long term (current) use of anticoagulants: Secondary | ICD-10-CM | POA: Diagnosis not present

## 2022-09-08 DIAGNOSIS — I69354 Hemiplegia and hemiparesis following cerebral infarction affecting left non-dominant side: Secondary | ICD-10-CM | POA: Diagnosis not present

## 2022-09-08 DIAGNOSIS — R1310 Dysphagia, unspecified: Secondary | ICD-10-CM | POA: Diagnosis not present

## 2022-09-08 DIAGNOSIS — N1831 Chronic kidney disease, stage 3a: Secondary | ICD-10-CM | POA: Diagnosis not present

## 2022-09-08 DIAGNOSIS — I69391 Dysphagia following cerebral infarction: Secondary | ICD-10-CM | POA: Diagnosis not present

## 2022-09-08 DIAGNOSIS — D631 Anemia in chronic kidney disease: Secondary | ICD-10-CM | POA: Diagnosis not present

## 2022-09-08 DIAGNOSIS — Z431 Encounter for attention to gastrostomy: Secondary | ICD-10-CM | POA: Diagnosis not present

## 2022-09-08 DIAGNOSIS — I69322 Dysarthria following cerebral infarction: Secondary | ICD-10-CM | POA: Diagnosis not present

## 2022-09-08 DIAGNOSIS — E43 Unspecified severe protein-calorie malnutrition: Secondary | ICD-10-CM | POA: Diagnosis not present

## 2022-09-08 DIAGNOSIS — I480 Paroxysmal atrial fibrillation: Secondary | ICD-10-CM | POA: Diagnosis not present

## 2022-09-08 DIAGNOSIS — E1122 Type 2 diabetes mellitus with diabetic chronic kidney disease: Secondary | ICD-10-CM | POA: Diagnosis not present

## 2022-09-08 NOTE — Progress Notes (Deleted)
Cardiology Office Note    Date:  09/08/2022   ID:  Evan SERRITELLA Sr., DOB [birth year], MRN [id number]  PCP:  Jon Billings, NP  Cardiologist:  Nelva Bush, MD  Electrophysiologist:  Vickie Epley, MD   Chief Complaint: ***  History of Present Illness:   Evan LACHAT Sr. is a 67 y.o. male with history of ***  ***   Labs independently reviewed: 08/2022 - potassium 5.1, BUN 32, serum creatinine 2.05, albumin 2.8, magnesium 2.3, Hgb 7.6, PLT 195, A1c 6.3, AST 86, ALT 102 06/2022 - TSH normal 04/2022 - TC 128, TG 91, HDL 47, LDL 63  Past Medical History:  Diagnosis Date   Acute CVA (cerebrovascular accident) ([place]) 06/05/2021   Acute ischemic stroke ([place]) 04/22/2022   CAP (community acquired pneumonia) 11/26/2021   Carotid arterial disease ([place])    a. 08/2018 Carotid U/S: min-mod RICA atherosclerosis w/o hemodynamically significant stenosis. Nl LICA.   Diabetes 1.5, managed as type 2 ([place])    Diastolic dysfunction    a. 08/2018 Echo: EF 65%. No rwma. Gr1 DD. Mild MR.   Diastolic dysfunction    a. 08/2019 Echo: EF 55-60%, Gr1 DD. No rwma. Mild MR. RVSP 37.52mHg.   Hypercholesterolemia    Hypertension    PAF (paroxysmal atrial fibrillation) ([place]    a. 10/2019 Event Monitor: PAF; b. CHA2DS2VASc = 5-->Eliquis.   Poorly controlled diabetes mellitus ([place]    a. 04/2019 A1c 13.8.   Recurrent strokes ([place] [place]    a. 10/2016 MRI/A: Acute 5132mR thalamic infarct, ? subacute infarct of R corona radiata; b. 08/2017 MRI/A: Acute 32m74materal L thalamic infarct. Other more remote lacunar infarcts of thalami bilat. Small vessel dzs; c. 08/2018 MRI/A: Acute lacunar infarct of the post limb of R internal capsule; d. 08/2019 MRI Acute CVA of L paramedian pons adn R cerebellar hemisphere.   Sepsis due to pneumonia ([place]/07/2022   Tobacco abuse     Past Surgical History:  Procedure Laterality Date   ESOPHAGOGASTRODUODENOSCOPY N/A 08/26/2022   Procedure: ESOPHAGOGASTRODUODENOSCOPY (EGD);   Surgeon: SakBenjamine SpragueO;  Location: [facility] ENDOSCOPY;  Service: General;  Laterality: N/A;   ESOPHAGOGASTRODUODENOSCOPY (EGD) WITH PROPOFOL N/A 04/26/2019   Procedure: ESOPHAGOGASTRODUODENOSCOPY (EGD) WITH PROPOFOL;  Surgeon: VanLin LandsmanD;  [facility];  Service: Gastroenterology;  Laterality: N/A;   LEFT HEART CATH AND CORONARY ANGIOGRAPHY N/A 01/09/2022   Procedure: LEFT HEART CATH AND CORONARY ANGIOGRAPHY;  Surgeon: EndNelva BushD;  [facility];  Service: Cardiovascular;  Laterality: N/A;   NO PAST SURGERIES     PEG PLACEMENT N/A 08/26/2022   Procedure: PERCUTANEOUS ENDOSCOPIC GASTROSTOMY (PEG) PLACEMENT;  Surgeon: SakBenjamine SpragueO;  Location: [facility] ENDOSCOPY;  Service: General;  Laterality: N/A;   TEE WITHOUT CARDIOVERSION N/A 04/22/2022   Procedure: TRANSESOPHAGEAL ECHOCARDIOGRAM (TEE);  Surgeon: AgbKate SableD;  Location: [facility] ORS;  Service: Cardiovascular;  Laterality: N/A;    Current Medications: No outpatient medications have been marked as taking for the 09/09/22 encounter (Appointment) with DunRise MuA-C.    Allergies:   Patient has no known allergies.   Social History   Socioeconomic History   Marital status: Married    Spouse name: bevPresenter, broadcastingNumber of children: 5   Years of education: Not on file   Highest education level: Not on file  Occupational History   Occupation: unemployed  Tobacco Use   Smoking status: Former    Packs/day: 1.00    Years:  38.00    Total pack years: 38.00    Types: Cigarettes    Start date: 1982   Smokeless tobacco: Never  Vaping Use   Vaping Use: Never used  Substance and Sexual Activity   Alcohol use: Not Currently    Alcohol/week: 1.0 standard drink of alcohol    Types: 1 Cans of beer per week    Comment: previously drank but nothing in 1-2 yrs (08/2019).   Drug use: Not Currently    Types: Cocaine, Marijuana    Comment: prev used cocaine/marijuana but none x 1-2 yrs  (08/2019).   Sexual activity: Yes  Other Topics Concern   Not on file  Social History Narrative   Lives in [place] with his wife.  Uses a cane when ambulating.  Can walk about 25 yds prior to having to rest - says he stumbles if he has to work further than that.  Usually uses a scooter to get around stores.   Social Determinants of Health   Financial Resource Strain: Low Risk  (04/30/2022)   Overall Financial Resource Strain (CARDIA)    Difficulty of Paying Living Expenses: Not very hard  Food Insecurity: No Food Insecurity (07/27/2022)   Hunger Vital Sign    Worried About Running Out of Food in the Last Year: Never true    Ran Out of Food in the Last Year: Never true  Transportation Needs: No Transportation Needs (07/27/2022)   PRAPARE - Hydrologist (Medical): No    Lack of Transportation (Non-Medical): No  Physical Activity: Unknown (11/25/2018)   Exercise Vital Sign    Days of Exercise per Week: 0 days    Minutes of Exercise per Session: Not asked  Stress: No Stress Concern Present (11/25/2018)   [place]    Feeling of Stress : Only a little  Social Connections: Moderately Integrated (01/13/2018)   Social Connection and Isolation Panel [NHANES]    Frequency of Communication with Friends and Family: More than three times a week    Frequency of Social Gatherings with Friends and Family: Once a week    Attends Religious Services: More than 4 times per year    Active Member of [employer] or Organizations: No    Attends Music therapist: Never    Marital Status: Married     Family History:  The patient's family history includes Diabetes in his father; Heart attack in his father; Hypertension in his father and mother; Stroke in his mother.  ROS:   12-point review of systems is negative unless otherwise noted in the HPI.   EKGs/Labs/Other Studies Reviewed:    Studies  reviewed were summarized above. The additional studies were reviewed today:  TEE 04/22/2022: 1. Left ventricular ejection fraction, by estimation, is 55 to 60%. The  left ventricle has normal function.   2. Right ventricular systolic function is normal. The right ventricular  size is normal.   3. No left atrial/left atrial appendage thrombus was detected.   4. A small pericardial effusion is present.   5. The mitral valve is normal in structure. Mild mitral valve  regurgitation.   6. The aortic valve is tricuspid. Aortic valve regurgitation is not  visualized.   7. Agitated saline contrast bubble study was negative, with no evidence  of any interatrial shunt.   Conclusion(s)/Recommendation(s): No LA/LAA thrombus identified. Negative  bubble study for interatrial shunt. No intracardiac source of embolism  detected on this  on this transesophageal echocardiogram.  __________  Carotid artery ultrasound 04/22/2022: IMPRESSION: 1. Moderate amount of right-sided atherosclerotic plaque, morphologically progressed compared to the 08/2018 examination, though not resulting in a hemodynamically significant stenosis within the right internal carotid artery. If clinical concern persists, further evaluation with CTA could performed as indicated. 2. Minimal amount of left-sided atherosclerotic plaque, morphologically progressed compared to the 08/2018 examination, though not resulting in a hemodynamically significant stenosis within the left internal carotid artery. __________  2D echo 04/21/2022: 1. Left ventricular ejection fraction, by estimation, is 55 to 60%. The  left ventricle has normal function. The left ventricle has no regional  wall motion abnormalities. There is mild left ventricular hypertrophy.  Left ventricular diastolic parameters  are consistent with Grade I diastolic dysfunction (impaired relaxation).   2. Right ventricular systolic function is normal. The right ventricular   size is normal. There is normal pulmonary artery systolic pressure.   3. Left atrial size was mildly dilated.   4. A small pericardial effusion is present. The pericardial effusion is  anterior to the right ventricle.   5. The mitral valve is normal in structure. Mild mitral valve  regurgitation. No evidence of mitral stenosis.   6. The aortic valve is tricuspid. Aortic valve regurgitation is not  visualized. No aortic stenosis is present.   7. The inferior vena cava is normal in size with greater than 50%  respiratory variability, suggesting right atrial pressure of 3 mmHg.   Comparison(s): Compared to prior study in 10/2021, pericardial effusion is  larger.  __________  Elwyn Reach patch 01/2022: HR 36 -  132 bpm, average 62 bpm. 2 nonsustained SVT, longest 9 beats at rate of 120 bpm. Rare supraventricular and ventricular ectopy. No atrial fibrillation. Good chronotropy. __________  LHC 01/09/2022:   Conclusions:  Mild-moderate, non-obstructive coronary artery disease with up to 50% stenoses involving D2, small mid LCx, and ostial RCA. Normal left ventricular filling pressure (LVEDP 12 mmHg).   Recommendations: Medical therapy and risk factor modification to prevent progression of coronary artery disease. Ongoing management of symptomatic bradycardia per primary teams and electrophysiology. __________  Cardiac MRI 01/07/2022: IMPRESSION: 1.  Normal LV size and function, LVEF 51% 2.  Normal RV size and function. 3.  No delayed enhancement or scar noted 4.  Normal native T1 value, elevated ECV. 5. With native T1 value being normal, elevated ECV likely influenced by motion artifact. 6. No findings to suggest infiltrative disease such as sarcoid or amyloid. __________  Elwyn Reach patch 10/2021: The patient was monitored for 14 days. The predominant rhythm was sinus with an average rate of 56 bpm (range 27-126 bpm). There were rare PACs and PVCs. Episodes of Mobitz type I second-degree  AV block and 2:1 AV block were observed. No prolonged pauses (greater than 3 seconds) was observed. Patient triggered events corresponded to sinus rhythm and PVCs.   Predominantly sinus rhythm with episodes of Mobitz type I second-degree AV block and 2:1 AV block. __________  2D echo 10/17/2021: 1. Left ventricular ejection fraction, by estimation, is 50 to 55%. The  left ventricle has low normal function. The left ventricle has no regional  wall motion abnormalities. Left ventricular diastolic parameters are  consistent with Grade I diastolic  dysfunction (impaired relaxation).   2. Right ventricular systolic function is normal. The right ventricular  size is normal. There is normal pulmonary artery systolic pressure. The  estimated right ventricular systolic pressure is [id number] mmHg.   3. The mitral valve is normal in  structure. Mild mitral valve  regurgitation. No evidence of mitral stenosis.   4. The aortic valve is tricuspid. Aortic valve regurgitation is not  visualized. No aortic stenosis is present.   5. The inferior vena cava is normal in size with greater than 50%  respiratory variability, suggesting right atrial pressure of 3 mmHg.  __________  Elwyn Reach patch 10/2019: The patient was monitored for 5 days, 15 hours. The predominant rhythm was sinus with an average rate of 62 bpm (range 38-138 bpm). There were rare PAC's and PVC's. No sustained arrhythmia or prolonged pause was identified. There were no patient triggered events.   Predominantly sinus rhythm without significant arrhythmia.  Of note, preceding 14 day monitor showed paroxysmal atrial fibrillation. __________   Elwyn Reach patch 09/2019: The patient was monitored for 14 days. The predominant rhythm was sinus with an average rate of 62 bpm (range 38 to 124 bpm in sinus). Rare PACs and PVCs were noted. Paroxysmal atrial fibrillation occurred, with the longest episode lasting 1 hours, 7 minutes. Average ventricular rate while in  atrial fibrillation was 90 bpm with a range of 51 to 188 bpm. Atrial fibrillation burden was less than 1%. There was no prolonged pause. Patient triggered event corresponds to sinus rhythm.   Predominantly sinus rhythm with paroxysmal atrial fibrillation (longest episode lasting 1 hour, 7 minutes; <1% burden).  Rare PACs and PVCs also noted. __________   Carlton Adam MPI 08/2019: This is a low risk study. The calculated left ventricular ejection fraction is moderately decreased (33%). This is likely a gating artifact as Visual EF appears normal at about 55%. recommend correlation with another imaging modality such as echocardiogram. There was no evidence for ischemia Mild diaphragmatic attenuation noted __________   2D echo 08/2019: 1. Left ventricular ejection fraction, by visual estimation, is 55 to  60%. The left ventricle has normal function. There is borderline left  ventricular hypertrophy.   2. Left ventricular diastolic parameters are consistent with Grade I  diastolic dysfunction (impaired relaxation).   3. The left ventricle has no regional wall motion abnormalities.   4. Global right ventricle has low normal systolic function.The right  ventricular size is normal. No increase in right ventricular wall  thickness.   5. Left atrial size was normal.   6. Right atrial size was normal.   7. The mitral valve is degenerative. Mild mitral valve regurgitation. No  evidence of mitral stenosis.   8. The tricuspid valve is grossly normal.   9. The tricuspid valve is grossly normal. Tricuspid valve regurgitation  is not demonstrated.  10. The aortic valve is tricuspid. Aortic valve regurgitation is not  visualized. No significant stenosis suspected, though evaluation is  limited by lack of spectral Doppler images.  11. The pulmonic valve was not well visualized. Pulmonic valve  regurgitation is not visualized.  12. Mildly elevated pulmonary artery systolic pressure.  13. The tricuspid  regurgitant velocity is 2.94 m/s, and with an assumed  right atrial pressure of 3 mmHg, the estimated right ventricular systolic  pressure is mildly elevated at 37.6 mmHg.  14. The inferior vena cava is normal in size with greater than 50%  respiratory variability, suggesting right atrial pressure of 3 mmHg.  15. The interatrial septum was not well visualized. __________   2D echo 08/2018: - Left ventricle: The cavity size was normal. There was mild    concentric hypertrophy. Systolic function was normal. The    estimated ejection fraction was 65%. Wall motion was normal;  there were no regional wall motion abnormalities. Doppler    parameters are consistent with abnormal left ventricular    relaxation (grade 1 diastolic dysfunction).  - Mitral valve: There was mild regurgitation.   Impressions:   - No cardiac source of emboli was indentified. __________   2D echo 10/2016: - Left ventricle: The cavity size was normal. Wall thickness was    increased in a pattern of mild LVH. Systolic function was normal.    The estimated ejection fraction was in the range of 55% to 65%.   EKG:  EKG is ordered today.  The EKG ordered today demonstrates ***  Recent Labs: 06/22/2022: B Natriuretic Peptide 168.2; TSH 1.095 08/24/2022: ALT 102 08/31/2022: BUN 32; Creatinine, Ser 2.05; Hemoglobin 7.6; Magnesium 2.3; Platelets 195; Potassium 5.1; Sodium 142  Recent Lipid Panel    Component Value Date/Time   CHOL 128 04/22/2022 0519   CHOL 134 11/12/2021 1532   TRIG 91 04/22/2022 0519   HDL 47 04/22/2022 0519   HDL 53 11/12/2021 1532   CHOLHDL 2.7 04/22/2022 0519   VLDL 18 04/22/2022 0519   LDLCALC 63 04/22/2022 0519   LDLCALC 52 11/12/2021 1532   LDLCALC 65 02/13/2020 1417    PHYSICAL EXAM:    VS:  There were no vitals taken for this visit.  BMI: There is no height or weight on file to calculate BMI.  Physical Exam  Wt Readings from Last 3 Encounters:  08/30/22 139 lb 8.8 oz (63.3 kg)   06/24/22 164 lb 12.8 oz (74.8 kg)  05/22/22 158 lb 4 oz (71.8 kg)     ASSESSMENT & PLAN:   ***   {Are you ordering a CV Procedure (e.g. stress test, cath, DCCV, TEE, etc)?   Press F2        :[id number]     Disposition: F/u with Dr. Saunders Revel or an APP in ***, and EP as directed.   Medication Adjustments/Labs and Tests Ordered: Current medicines are reviewed at length with the patient today.  Concerns regarding medicines are outlined above. Medication changes, Labs and Tests ordered today are summarized above and listed in the Patient Instructions accessible in Encounters.   Signed, Christell Faith, PA-C 09/08/2022 7:17 AM     [address], [place] [address] [phone]

## 2022-09-08 NOTE — Telephone Encounter (Signed)
Orders for both the feeding tube and depends singed by provider and faxed to [facility].

## 2022-09-08 NOTE — Telephone Encounter (Signed)
Called and spoke to patient's wife. She states she has been needing the patient's food for his feeding tube and size M depends for the patient. Let patient's wife know that we have been in communication with [facility] with [facility] and are working to wrote the prescriptions for these items.

## 2022-09-09 ENCOUNTER — Encounter: Payer: Medicare Other | Admitting: Speech Pathology

## 2022-09-09 ENCOUNTER — Ambulatory Visit: Payer: Medicare Other

## 2022-09-09 ENCOUNTER — Telehealth: Payer: Self-pay | Admitting: Nurse Practitioner

## 2022-09-09 ENCOUNTER — Encounter: Payer: Self-pay | Admitting: Physician Assistant

## 2022-09-09 ENCOUNTER — Encounter: Payer: Medicare Other | Admitting: Occupational Therapy

## 2022-09-09 ENCOUNTER — Ambulatory Visit: Payer: 59 | Attending: Physician Assistant | Admitting: Physician Assistant

## 2022-09-09 DIAGNOSIS — E43 Unspecified severe protein-calorie malnutrition: Secondary | ICD-10-CM | POA: Diagnosis not present

## 2022-09-09 DIAGNOSIS — I480 Paroxysmal atrial fibrillation: Secondary | ICD-10-CM | POA: Diagnosis not present

## 2022-09-09 DIAGNOSIS — I69322 Dysarthria following cerebral infarction: Secondary | ICD-10-CM | POA: Diagnosis not present

## 2022-09-09 DIAGNOSIS — R1310 Dysphagia, unspecified: Secondary | ICD-10-CM | POA: Diagnosis not present

## 2022-09-09 DIAGNOSIS — Z7901 Long term (current) use of anticoagulants: Secondary | ICD-10-CM | POA: Diagnosis not present

## 2022-09-09 DIAGNOSIS — Z931 Gastrostomy status: Secondary | ICD-10-CM | POA: Diagnosis not present

## 2022-09-09 DIAGNOSIS — E1122 Type 2 diabetes mellitus with diabetic chronic kidney disease: Secondary | ICD-10-CM | POA: Diagnosis not present

## 2022-09-09 DIAGNOSIS — Z7984 Long term (current) use of oral hypoglycemic drugs: Secondary | ICD-10-CM | POA: Diagnosis not present

## 2022-09-09 DIAGNOSIS — I129 Hypertensive chronic kidney disease with stage 1 through stage 4 chronic kidney disease, or unspecified chronic kidney disease: Secondary | ICD-10-CM | POA: Diagnosis not present

## 2022-09-09 DIAGNOSIS — N1831 Chronic kidney disease, stage 3a: Secondary | ICD-10-CM | POA: Diagnosis not present

## 2022-09-09 DIAGNOSIS — J690 Pneumonitis due to inhalation of food and vomit: Secondary | ICD-10-CM | POA: Diagnosis not present

## 2022-09-09 DIAGNOSIS — Z431 Encounter for attention to gastrostomy: Secondary | ICD-10-CM | POA: Diagnosis not present

## 2022-09-09 DIAGNOSIS — D631 Anemia in chronic kidney disease: Secondary | ICD-10-CM | POA: Diagnosis not present

## 2022-09-09 DIAGNOSIS — I69354 Hemiplegia and hemiparesis following cerebral infarction affecting left non-dominant side: Secondary | ICD-10-CM | POA: Diagnosis not present

## 2022-09-09 DIAGNOSIS — Z9181 History of falling: Secondary | ICD-10-CM | POA: Diagnosis not present

## 2022-09-09 DIAGNOSIS — I69391 Dysphagia following cerebral infarction: Secondary | ICD-10-CM | POA: Diagnosis not present

## 2022-09-09 NOTE — Telephone Encounter (Signed)
Evan Townsend Best contact: [phone] [facility]  Called to report some of the patients vitals from today:   Body temperature 90.3 feels cool and clammy

## 2022-09-09 NOTE — Telephone Encounter (Signed)
Called and LVM asking for Manuela Schwartz to please return my call.   OK for PEC to speak to patient and find out the information requested by the provider.

## 2022-09-09 NOTE — Telephone Encounter (Signed)
What are his other vitals? Does he look well appearing? Should he be seen in the ER.

## 2022-09-10 ENCOUNTER — Ambulatory Visit: Payer: Medicare Other | Admitting: Podiatry

## 2022-09-10 ENCOUNTER — Ambulatory Visit: Payer: Self-pay

## 2022-09-10 ENCOUNTER — Ambulatory Visit: Payer: Self-pay | Admitting: *Deleted

## 2022-09-10 NOTE — Telephone Encounter (Signed)
Received call from Manuela Schwartz at [employer]. She states she did not receive a call back yesterday. She is calling today to make this was addressed. Manuela Schwartz states that other vitals yesterday were good. Blood sugar was 142. Pt's wife was not overly concerned. Per Manuela Schwartz pt is not overly communicative, however did respond appropriately to questions.  Wife was given instructions  s/s to watch for.  Called wife for update. Wife states pt seems fine. She states he was asking for pain medication all night last night.  Wife stated that [facility] was calling in on the other line and call was ended.

## 2022-09-10 NOTE — Telephone Encounter (Signed)
See other phone encounter.  

## 2022-09-10 NOTE — Patient Instructions (Signed)
Visit Information  Thank you for taking time to visit with me today. Please don't hesitate to contact me if I can be of assistance to you.   Following are the goals we discussed today:   Goals Addressed             This Visit's Progress    Per spouse "I would like to be paid to care for my husband"       Care Coordination Interventions: Phone call to patient's spouse stating that she continues to need  tube feeding for spouse-at this time she is using Ensure -will collaborate with RNCM to determine next steps Assessment completed for CAP services-next steps would be to confirm approval through the department of social services-per patient's spouse, she was provided with the contact number to follow up Care coordinator line contacted through [facility] [phone] and was able to determine that patient would be able to be followed for care coordination as a benefit of his insurance-this social worker will discuss transition to this team with patient's spouse           Our next appointment is by telephone on 09/11/22 at 1pm  Please call the care guide team at [phone] if you need to cancel or reschedule your appointment.   If you are experiencing a Mental Health or [place] or need someone to talk to, please call 911   Patient verbalizes understanding of instructions and care plan provided today and agrees to view in [place]. Active MyChart status and patient understanding of how to access instructions and care plan via MyChart confirmed with patient.     Telephone follow up appointment with care management team member scheduled for: 09/11/22  Elliot Gurney, [place] Worker  [facility] Care Management [phone]

## 2022-09-10 NOTE — Patient Outreach (Signed)
  Care Coordination   Follow Up Visit Note   09/10/2022 Name: Evan PANGBORN Sr. MRN: [id number] DOB: 02-25-1956  Evan Cones Sr. is a 67 y.o. year old male who sees Jon Billings, NP for primary care. I spoke with  Evan Cones Sr. by phone today.  What matters to the patients health and wellness today?  Tube feedings and in home support    Goals Addressed             This Visit's Progress    Per spouse "I would like to be paid to care for my husband"       Care Coordination Interventions: Phone call to patient's spouse stating that she continues to need  tube feeding for spouse-at this time she is using Ensure -will collaborate with RNCM to determine next steps Assessment completed for CAP services-next steps would be to confirm approval through the department of social services-per patient's spouse, she was provided with the contact number to follow up Care coordinator line contacted through [facility] [phone] and was able to determine that patient would be able to be followed for care coordination as a benefit of his insurance-this Education officer, museum will discuss transition to this team with patient's spouse           SDOH assessments and interventions completed:  No     Care Coordination Interventions:  Yes, provided   Follow up plan: Follow up call scheduled for 09/11/22    Encounter Outcome:  Pt. Visit Completed

## 2022-09-10 NOTE — Telephone Encounter (Signed)
FYI to provider

## 2022-09-10 NOTE — Telephone Encounter (Signed)
Tell patient's wife thank you for the up date.  If she has further concerns please let us know.

## 2022-09-11 ENCOUNTER — Ambulatory Visit: Payer: Self-pay | Admitting: *Deleted

## 2022-09-11 ENCOUNTER — Telehealth: Payer: Self-pay | Admitting: Nurse Practitioner

## 2022-09-11 DIAGNOSIS — I69354 Hemiplegia and hemiparesis following cerebral infarction affecting left non-dominant side: Secondary | ICD-10-CM | POA: Diagnosis not present

## 2022-09-11 DIAGNOSIS — Z431 Encounter for attention to gastrostomy: Secondary | ICD-10-CM | POA: Diagnosis not present

## 2022-09-11 DIAGNOSIS — I69322 Dysarthria following cerebral infarction: Secondary | ICD-10-CM | POA: Diagnosis not present

## 2022-09-11 DIAGNOSIS — Z7984 Long term (current) use of oral hypoglycemic drugs: Secondary | ICD-10-CM | POA: Diagnosis not present

## 2022-09-11 DIAGNOSIS — D631 Anemia in chronic kidney disease: Secondary | ICD-10-CM | POA: Diagnosis not present

## 2022-09-11 DIAGNOSIS — I480 Paroxysmal atrial fibrillation: Secondary | ICD-10-CM | POA: Diagnosis not present

## 2022-09-11 DIAGNOSIS — Z9181 History of falling: Secondary | ICD-10-CM | POA: Diagnosis not present

## 2022-09-11 DIAGNOSIS — E43 Unspecified severe protein-calorie malnutrition: Secondary | ICD-10-CM | POA: Diagnosis not present

## 2022-09-11 DIAGNOSIS — I69391 Dysphagia following cerebral infarction: Secondary | ICD-10-CM | POA: Diagnosis not present

## 2022-09-11 DIAGNOSIS — Z931 Gastrostomy status: Secondary | ICD-10-CM | POA: Diagnosis not present

## 2022-09-11 DIAGNOSIS — N1831 Chronic kidney disease, stage 3a: Secondary | ICD-10-CM | POA: Diagnosis not present

## 2022-09-11 DIAGNOSIS — E1122 Type 2 diabetes mellitus with diabetic chronic kidney disease: Secondary | ICD-10-CM | POA: Diagnosis not present

## 2022-09-11 DIAGNOSIS — I129 Hypertensive chronic kidney disease with stage 1 through stage 4 chronic kidney disease, or unspecified chronic kidney disease: Secondary | ICD-10-CM | POA: Diagnosis not present

## 2022-09-11 DIAGNOSIS — R1310 Dysphagia, unspecified: Secondary | ICD-10-CM | POA: Diagnosis not present

## 2022-09-11 DIAGNOSIS — J690 Pneumonitis due to inhalation of food and vomit: Secondary | ICD-10-CM | POA: Diagnosis not present

## 2022-09-11 DIAGNOSIS — Z7901 Long term (current) use of anticoagulants: Secondary | ICD-10-CM | POA: Diagnosis not present

## 2022-09-11 NOTE — Telephone Encounter (Signed)
Message from Erick Blinks sent at 09/11/2022  1:34 PM EST  Summary: Needs pain medicine, restless/not sleeping   Pt's spouse called reporting that the patient is not sleeping because he is restless/in pain. Seeking Rx  Best contact: [phone]          Call History   Type Contact Phone/Fax User  09/11/2022 01:32 PM EST Phone (Incoming) Filler,Beverly D (Emergency Contact) [phone] Marlan Palau

## 2022-09-11 NOTE — Telephone Encounter (Signed)
Copied from [place] [phone]. Topic: General - Other >> Sep 11, 2022 11:09 AM Cyndi Bender wrote: Reason for CRM: Lilia Pro with [facility] reports that pt had a fall on yesterday but there were no injuries. Lilia Pro then stated pt blood pressure and heart rate is running low. Cb# [phone]

## 2022-09-11 NOTE — Telephone Encounter (Signed)
Have attempted several times to return call to [facility] without success.   Voice mailbox is full and not accepting messages at this time.  Per policy I have forwarded this to [facility] for Jon Billings, NP since 3 attempts have been made to return the call.

## 2022-09-11 NOTE — Patient Outreach (Signed)
  Care Coordination   Follow Up Visit Note   09/11/2022 Name: Evan DANI Sr. MRN: [id number] DOB: [birth year]  Evan Cones Sr. is a 67 y.o. year old male who sees Jon Billings, NP for primary care. I spoke with  Evan Cones Sr.'s spouse by phone today.  What matters to the patients health and wellness today?  CAP services    Goals Addressed             This Visit's Progress    Per spouse "I would like to be paid to care for my husband"       Care Coordination Interventions: Phone call to patient's spouse stating that she continues to need  tube feeding for spouse-at this time she is using Ensure -insurance auth pending for Osmolite Assessment completed for CAP services-next steps would be to confirm approval through the department of social services-per patient's spouse, she was provided with the contact number to follow up Patient currently covered under [facility] DSNP-benefit discussed- Patient's spouse provided with [facility] DSNP Navigator for ongoing care coordination [phone] Patient's spouse agreeable to follow up with the Department of Social Services regarding approval for CAP and will also contact the [employer]-DSNP Navigator for care coordination           SDOH assessments and interventions completed:  No     Care Coordination Interventions:  Yes, provided   Follow up plan: No further intervention required.   Encounter Outcome:  Pt. Visit Completed

## 2022-09-11 NOTE — Telephone Encounter (Signed)
Called to see if patient could be brought in for an appointment with Junie Panning this afternoon since his PCP is out. Unable to leave a vm due to mailbox being full

## 2022-09-11 NOTE — Telephone Encounter (Signed)
Attempted to return the call to Riesa Pope however the voice mailbox was full so unable to leave a message.   Will try again later.

## 2022-09-11 NOTE — Patient Instructions (Signed)
Visit Information  Thank you for taking time to visit with me today. Please don't hesitate to contact me if I can be of assistance to you.   Following are the goals we discussed today:   Goals Addressed             This Visit's Progress    Per spouse "I would like to be paid to care for my husband"       Care Coordination Interventions: Phone call to patient's spouse stating that she continues to need  tube feeding for spouse-at this time she is using Ensure -insurance auth pending for Osmolite Assessment completed for CAP services-next steps would be to confirm approval through the department of social services-per patient's spouse, she was provided with the contact number to follow up Patient currently covered under [facility] DSNP-benefit discussed- Patient's spouse provided with [facility] for ongoing care coordination [phone] Patient's spouse agreeable to follow up with the Department of Social Services regarding approval for CAP and will also contact the [employer]-DSNP Navigator for care coordination            Please call the care guide team at [phone] if you need to cancel or reschedule your appointment.   If you are experiencing a Mental Health or [place] or need someone to talk to, please call 911   Patient verbalizes understanding of instructions and care plan provided today and agrees to view in [place]. Active MyChart status and patient understanding of how to access instructions and care plan via MyChart confirmed with patient.     No further follow up required: patient's spouse to follow up with the Department of Social Services regarding status of CAP services and [facility] Navigator [phone]  Elliot Gurney, [facility] Care Management [phone]

## 2022-09-14 ENCOUNTER — Telehealth: Payer: Self-pay | Admitting: Nurse Practitioner

## 2022-09-14 DIAGNOSIS — Z9181 History of falling: Secondary | ICD-10-CM | POA: Diagnosis not present

## 2022-09-14 DIAGNOSIS — Z931 Gastrostomy status: Secondary | ICD-10-CM | POA: Diagnosis not present

## 2022-09-14 DIAGNOSIS — I129 Hypertensive chronic kidney disease with stage 1 through stage 4 chronic kidney disease, or unspecified chronic kidney disease: Secondary | ICD-10-CM | POA: Diagnosis not present

## 2022-09-14 DIAGNOSIS — E1122 Type 2 diabetes mellitus with diabetic chronic kidney disease: Secondary | ICD-10-CM | POA: Diagnosis not present

## 2022-09-14 DIAGNOSIS — I69354 Hemiplegia and hemiparesis following cerebral infarction affecting left non-dominant side: Secondary | ICD-10-CM | POA: Diagnosis not present

## 2022-09-14 DIAGNOSIS — Z7901 Long term (current) use of anticoagulants: Secondary | ICD-10-CM | POA: Diagnosis not present

## 2022-09-14 DIAGNOSIS — I69391 Dysphagia following cerebral infarction: Secondary | ICD-10-CM | POA: Diagnosis not present

## 2022-09-14 DIAGNOSIS — J690 Pneumonitis due to inhalation of food and vomit: Secondary | ICD-10-CM | POA: Diagnosis not present

## 2022-09-14 DIAGNOSIS — N1831 Chronic kidney disease, stage 3a: Secondary | ICD-10-CM | POA: Diagnosis not present

## 2022-09-14 DIAGNOSIS — Z7984 Long term (current) use of oral hypoglycemic drugs: Secondary | ICD-10-CM | POA: Diagnosis not present

## 2022-09-14 DIAGNOSIS — D631 Anemia in chronic kidney disease: Secondary | ICD-10-CM | POA: Diagnosis not present

## 2022-09-14 DIAGNOSIS — E43 Unspecified severe protein-calorie malnutrition: Secondary | ICD-10-CM | POA: Diagnosis not present

## 2022-09-14 DIAGNOSIS — I480 Paroxysmal atrial fibrillation: Secondary | ICD-10-CM | POA: Diagnosis not present

## 2022-09-14 DIAGNOSIS — Z431 Encounter for attention to gastrostomy: Secondary | ICD-10-CM | POA: Diagnosis not present

## 2022-09-14 DIAGNOSIS — I69322 Dysarthria following cerebral infarction: Secondary | ICD-10-CM | POA: Diagnosis not present

## 2022-09-14 DIAGNOSIS — R531 Weakness: Secondary | ICD-10-CM | POA: Diagnosis not present

## 2022-09-14 DIAGNOSIS — R1310 Dysphagia, unspecified: Secondary | ICD-10-CM | POA: Diagnosis not present

## 2022-09-14 NOTE — Telephone Encounter (Signed)
Sherlyn Hay calling from [place] is calling to ask Santiago Glad where else can the patient get Nutritional Supplements (FEEDING SUPPLEMENT, OSMOLITE 1.5 CAL,) LIQD [[id number]] ? Patient's family purchasing something else out of pocket because [facility] does not carry the osmolit. Please advise with Beverly CB- [place] [phone]

## 2022-09-14 NOTE — Telephone Encounter (Signed)
Please emphasize to patient's wife that patient needs to keep appointment for 10-10-2022 in person.

## 2022-09-15 ENCOUNTER — Ambulatory Visit: Payer: Medicare Other

## 2022-09-15 ENCOUNTER — Telehealth: Payer: Self-pay | Admitting: Nurse Practitioner

## 2022-09-15 ENCOUNTER — Encounter: Payer: Medicare Other | Admitting: Occupational Therapy

## 2022-09-15 DIAGNOSIS — I69354 Hemiplegia and hemiparesis following cerebral infarction affecting left non-dominant side: Secondary | ICD-10-CM | POA: Diagnosis not present

## 2022-09-15 DIAGNOSIS — J690 Pneumonitis due to inhalation of food and vomit: Secondary | ICD-10-CM | POA: Diagnosis not present

## 2022-09-15 DIAGNOSIS — D631 Anemia in chronic kidney disease: Secondary | ICD-10-CM | POA: Diagnosis not present

## 2022-09-15 DIAGNOSIS — Z931 Gastrostomy status: Secondary | ICD-10-CM | POA: Diagnosis not present

## 2022-09-15 DIAGNOSIS — I69322 Dysarthria following cerebral infarction: Secondary | ICD-10-CM | POA: Diagnosis not present

## 2022-09-15 DIAGNOSIS — R1310 Dysphagia, unspecified: Secondary | ICD-10-CM | POA: Diagnosis not present

## 2022-09-15 DIAGNOSIS — E43 Unspecified severe protein-calorie malnutrition: Secondary | ICD-10-CM | POA: Diagnosis not present

## 2022-09-15 DIAGNOSIS — I69391 Dysphagia following cerebral infarction: Secondary | ICD-10-CM | POA: Diagnosis not present

## 2022-09-15 DIAGNOSIS — I480 Paroxysmal atrial fibrillation: Secondary | ICD-10-CM | POA: Diagnosis not present

## 2022-09-15 DIAGNOSIS — Z7984 Long term (current) use of oral hypoglycemic drugs: Secondary | ICD-10-CM | POA: Diagnosis not present

## 2022-09-15 DIAGNOSIS — Z7901 Long term (current) use of anticoagulants: Secondary | ICD-10-CM | POA: Diagnosis not present

## 2022-09-15 DIAGNOSIS — E1122 Type 2 diabetes mellitus with diabetic chronic kidney disease: Secondary | ICD-10-CM | POA: Diagnosis not present

## 2022-09-15 DIAGNOSIS — Z9181 History of falling: Secondary | ICD-10-CM | POA: Diagnosis not present

## 2022-09-15 DIAGNOSIS — N1831 Chronic kidney disease, stage 3a: Secondary | ICD-10-CM | POA: Diagnosis not present

## 2022-09-15 DIAGNOSIS — Z431 Encounter for attention to gastrostomy: Secondary | ICD-10-CM | POA: Diagnosis not present

## 2022-09-15 DIAGNOSIS — I129 Hypertensive chronic kidney disease with stage 1 through stage 4 chronic kidney disease, or unspecified chronic kidney disease: Secondary | ICD-10-CM | POA: Diagnosis not present

## 2022-09-15 NOTE — Telephone Encounter (Signed)
Tried calling patient's wife, no answer and VM full.   OK for PEC to give message to patient. Please advise them to keep appointment tomorrow. We have to do an in office appointment in order to get documentation for Osmolite.

## 2022-09-15 NOTE — Telephone Encounter (Signed)
Home Health Verbal Orders - Caller/Agency: Santiago Glad With [facility] Number: [phone]  Requesting OT/PT/Skilled Nursing/Social Work/Speech Therapy: speech therapy eval  Frequency:

## 2022-09-15 NOTE — Telephone Encounter (Signed)
Okay for verbal orders. 

## 2022-09-15 NOTE — Telephone Encounter (Signed)
Called and gave verbal orders per Karen.  

## 2022-09-15 NOTE — Telephone Encounter (Signed)
Tried calling patient's wife, no answer and VM full.   OK for PEC to speak to patient's wife/ Please advise them to keep appointment tomorrow. We have to do an in office appointment in order to get documentation for Osmolite.

## 2022-09-16 ENCOUNTER — Emergency Department: Payer: 59

## 2022-09-16 ENCOUNTER — Encounter: Payer: Medicare Other | Admitting: Occupational Therapy

## 2022-09-16 ENCOUNTER — Inpatient Hospital Stay: Payer: 59 | Admitting: Nurse Practitioner

## 2022-09-16 ENCOUNTER — Ambulatory Visit: Payer: Medicare Other

## 2022-09-16 ENCOUNTER — Telehealth: Payer: Self-pay | Admitting: Nurse Practitioner

## 2022-09-16 ENCOUNTER — Inpatient Hospital Stay
Admission: EM | Admit: 2022-09-16 | Discharge: 2022-10-16 | DRG: 951 | Disposition: E | Payer: 59 | Attending: Internal Medicine | Admitting: Internal Medicine

## 2022-09-16 ENCOUNTER — Encounter: Payer: Medicare Other | Admitting: Speech Pathology

## 2022-09-16 DIAGNOSIS — Z515 Encounter for palliative care: Secondary | ICD-10-CM | POA: Diagnosis not present

## 2022-09-16 DIAGNOSIS — N179 Acute kidney failure, unspecified: Secondary | ICD-10-CM | POA: Diagnosis not present

## 2022-09-16 DIAGNOSIS — I2510 Atherosclerotic heart disease of native coronary artery without angina pectoris: Secondary | ICD-10-CM | POA: Diagnosis present

## 2022-09-16 DIAGNOSIS — J9602 Acute respiratory failure with hypercapnia: Secondary | ICD-10-CM | POA: Diagnosis present

## 2022-09-16 DIAGNOSIS — Z823 Family history of stroke: Secondary | ICD-10-CM

## 2022-09-16 DIAGNOSIS — E876 Hypokalemia: Secondary | ICD-10-CM | POA: Diagnosis present

## 2022-09-16 DIAGNOSIS — R0602 Shortness of breath: Secondary | ICD-10-CM | POA: Diagnosis not present

## 2022-09-16 DIAGNOSIS — R570 Cardiogenic shock: Secondary | ICD-10-CM

## 2022-09-16 DIAGNOSIS — J690 Pneumonitis due to inhalation of food and vomit: Secondary | ICD-10-CM | POA: Diagnosis present

## 2022-09-16 DIAGNOSIS — I6521 Occlusion and stenosis of right carotid artery: Secondary | ICD-10-CM | POA: Diagnosis not present

## 2022-09-16 DIAGNOSIS — Z87891 Personal history of nicotine dependence: Secondary | ICD-10-CM

## 2022-09-16 DIAGNOSIS — E1322 Other specified diabetes mellitus with diabetic chronic kidney disease: Secondary | ICD-10-CM | POA: Diagnosis not present

## 2022-09-16 DIAGNOSIS — J9601 Acute respiratory failure with hypoxia: Secondary | ICD-10-CM | POA: Diagnosis present

## 2022-09-16 DIAGNOSIS — E872 Acidosis, unspecified: Secondary | ICD-10-CM | POA: Diagnosis present

## 2022-09-16 DIAGNOSIS — R29818 Other symptoms and signs involving the nervous system: Secondary | ICD-10-CM | POA: Diagnosis not present

## 2022-09-16 DIAGNOSIS — R410 Disorientation, unspecified: Principal | ICD-10-CM

## 2022-09-16 DIAGNOSIS — R001 Bradycardia, unspecified: Secondary | ICD-10-CM | POA: Diagnosis not present

## 2022-09-16 DIAGNOSIS — Z7901 Long term (current) use of anticoagulants: Secondary | ICD-10-CM

## 2022-09-16 DIAGNOSIS — N170 Acute kidney failure with tubular necrosis: Secondary | ICD-10-CM | POA: Diagnosis not present

## 2022-09-16 DIAGNOSIS — E7800 Pure hypercholesterolemia, unspecified: Secondary | ICD-10-CM | POA: Diagnosis not present

## 2022-09-16 DIAGNOSIS — Z8249 Family history of ischemic heart disease and other diseases of the circulatory system: Secondary | ICD-10-CM

## 2022-09-16 DIAGNOSIS — N184 Chronic kidney disease, stage 4 (severe): Secondary | ICD-10-CM | POA: Diagnosis not present

## 2022-09-16 DIAGNOSIS — Z833 Family history of diabetes mellitus: Secondary | ICD-10-CM

## 2022-09-16 DIAGNOSIS — E875 Hyperkalemia: Secondary | ICD-10-CM | POA: Diagnosis present

## 2022-09-16 DIAGNOSIS — I130 Hypertensive heart and chronic kidney disease with heart failure and stage 1 through stage 4 chronic kidney disease, or unspecified chronic kidney disease: Secondary | ICD-10-CM | POA: Diagnosis not present

## 2022-09-16 DIAGNOSIS — E871 Hypo-osmolality and hyponatremia: Secondary | ICD-10-CM | POA: Diagnosis not present

## 2022-09-16 DIAGNOSIS — Z66 Do not resuscitate: Secondary | ICD-10-CM | POA: Diagnosis not present

## 2022-09-16 DIAGNOSIS — Z8673 Personal history of transient ischemic attack (TIA), and cerebral infarction without residual deficits: Secondary | ICD-10-CM | POA: Diagnosis not present

## 2022-09-16 DIAGNOSIS — Z931 Gastrostomy status: Secondary | ICD-10-CM | POA: Diagnosis not present

## 2022-09-16 DIAGNOSIS — R4182 Altered mental status, unspecified: Secondary | ICD-10-CM | POA: Diagnosis present

## 2022-09-16 DIAGNOSIS — R911 Solitary pulmonary nodule: Secondary | ICD-10-CM | POA: Diagnosis not present

## 2022-09-16 DIAGNOSIS — R6521 Severe sepsis with septic shock: Secondary | ICD-10-CM

## 2022-09-16 DIAGNOSIS — R4189 Other symptoms and signs involving cognitive functions and awareness: Secondary | ICD-10-CM

## 2022-09-16 DIAGNOSIS — I480 Paroxysmal atrial fibrillation: Secondary | ICD-10-CM | POA: Diagnosis present

## 2022-09-16 DIAGNOSIS — R579 Shock, unspecified: Secondary | ICD-10-CM

## 2022-09-16 DIAGNOSIS — N189 Chronic kidney disease, unspecified: Secondary | ICD-10-CM | POA: Diagnosis not present

## 2022-09-16 DIAGNOSIS — A419 Sepsis, unspecified organism: Secondary | ICD-10-CM | POA: Diagnosis not present

## 2022-09-16 DIAGNOSIS — N171 Acute kidney failure with acute cortical necrosis: Secondary | ICD-10-CM | POA: Diagnosis not present

## 2022-09-16 DIAGNOSIS — D539 Nutritional anemia, unspecified: Secondary | ICD-10-CM | POA: Diagnosis present

## 2022-09-16 DIAGNOSIS — Z79899 Other long term (current) drug therapy: Secondary | ICD-10-CM

## 2022-09-16 DIAGNOSIS — I129 Hypertensive chronic kidney disease with stage 1 through stage 4 chronic kidney disease, or unspecified chronic kidney disease: Secondary | ICD-10-CM | POA: Diagnosis not present

## 2022-09-16 LAB — CBC WITH DIFFERENTIAL/PLATELET
Abs Immature Granulocytes: 0.05 10*3/uL (ref 0.00–0.07)
Basophils Absolute: 0 10*3/uL (ref 0.0–0.1)
Basophils Relative: 1 %
Eosinophils Absolute: 0 10*3/uL (ref 0.0–0.5)
Eosinophils Relative: 1 %
HCT: 25.7 % — ABNORMAL LOW (ref 39.0–52.0)
Hemoglobin: 8 g/dL — ABNORMAL LOW (ref 13.0–17.0)
Immature Granulocytes: 1 %
Lymphocytes Relative: 12 %
Lymphs Abs: 0.7 10*3/uL (ref 0.7–4.0)
MCH: 26.3 pg (ref 26.0–34.0)
MCHC: 31.1 g/dL (ref 30.0–36.0)
MCV: 84.5 fL (ref 80.0–100.0)
Monocytes Absolute: 0.4 10*3/uL (ref 0.1–1.0)
Monocytes Relative: 6 %
Neutro Abs: 4.5 10*3/uL (ref 1.7–7.7)
Neutrophils Relative %: 79 %
Platelets: 311 10*3/uL (ref 150–400)
RBC: 3.04 MIL/uL — ABNORMAL LOW (ref 4.22–5.81)
RDW: 18.6 % — ABNORMAL HIGH (ref 11.5–15.5)
Smear Review: NORMAL
WBC Morphology: INCREASED
WBC: 5.6 10*3/uL (ref 4.0–10.5)
nRBC: 2.1 % — ABNORMAL HIGH (ref 0.0–0.2)

## 2022-09-16 LAB — ETHANOL: Alcohol, Ethyl (B): <10 mg/dL (ref <10)

## 2022-09-16 LAB — COMPREHENSIVE METABOLIC PANEL
ALT: 71 U/L — ABNORMAL HIGH (ref 0–44)
AST: 57 U/L — ABNORMAL HIGH (ref 15–41)
Albumin: 2.7 g/dL — ABNORMAL LOW (ref 3.5–5.0)
Alkaline Phosphatase: 164 U/L — ABNORMAL HIGH (ref 38–126)
Anion gap: 11 (ref 5–15)
BUN: 85 mg/dL — ABNORMAL HIGH (ref 8–23)
CO2: 15 mmol/L — ABNORMAL LOW (ref 22–32)
Calcium: 8.4 mg/dL — ABNORMAL LOW (ref 8.9–10.3)
Chloride: 99 mmol/L (ref 98–111)
Creatinine, Ser: 3.89 mg/dL — ABNORMAL HIGH (ref 0.61–1.24)
GFR, Estimated: 16 mL/min — ABNORMAL LOW (ref >60)
Glucose, Bld: 155 mg/dL — ABNORMAL HIGH (ref 70–99)
Potassium: 6.1 mmol/L — ABNORMAL HIGH (ref 3.5–5.1)
Sodium: 125 mmol/L — ABNORMAL LOW (ref 135–145)
Total Bilirubin: 0.6 mg/dL (ref 0.3–1.2)
Total Protein: 7.4 g/dL (ref 6.5–8.1)

## 2022-09-16 LAB — LACTIC ACID, PLASMA: Lactic Acid, Venous: 1.4 mmol/L (ref 0.5–1.9)

## 2022-09-16 LAB — CBG MONITORING, ED
Glucose-Capillary: 162 mg/dL — ABNORMAL HIGH (ref 70–99)
Glucose-Capillary: 238 mg/dL — ABNORMAL HIGH (ref 70–99)

## 2022-09-16 LAB — TROPONIN I (HIGH SENSITIVITY): Troponin I (High Sensitivity): 5 ng/L (ref <18)

## 2022-09-16 MED ORDER — VANCOMYCIN HCL 1500 MG/300ML IV SOLN
1500.0000 mg | Freq: Once | INTRAVENOUS | Status: DC
  Filled 2022-09-16: qty 300

## 2022-09-16 MED ORDER — IOHEXOL 350 MG/ML SOLN
100.0000 mL | Freq: Once | INTRAVENOUS | Status: AC | PRN
  Administered 2022-09-16: 100 mL via INTRAVENOUS

## 2022-09-16 MED ORDER — SODIUM CHLORIDE 0.9 % IV SOLN
2.0000 g | Freq: Once | INTRAVENOUS | Status: AC
  Administered 2022-09-16: 2 g via INTRAVENOUS
  Filled 2022-09-16: qty 12.5

## 2022-09-16 MED ORDER — GLYCOPYRROLATE 0.2 MG/ML IJ SOLN
0.1000 mg | Freq: Once | INTRAMUSCULAR | Status: AC
  Administered 2022-09-16: 0.1 mg via INTRAVENOUS
  Filled 2022-09-16: qty 0.5

## 2022-09-16 MED ORDER — SODIUM BICARBONATE 8.4 % IV SOLN
INTRAVENOUS | Status: AC
  Administered 2022-09-16: 50 meq via INTRAVENOUS
  Filled 2022-09-16: qty 50

## 2022-09-16 MED ORDER — DEXTROSE 50 % IV SOLN
1.0000 | Freq: Once | INTRAVENOUS | Status: AC
  Administered 2022-09-16: 50 mL via INTRAVENOUS
  Filled 2022-09-16: qty 50

## 2022-09-16 MED ORDER — STERILE WATER FOR INJECTION IV SOLN
INTRAVENOUS | Status: DC
  Filled 2022-09-16: qty 1000

## 2022-09-16 MED ORDER — METOCLOPRAMIDE HCL 5 MG/5ML PO SOLN: 5.0000 mg | Freq: Three times a day (TID) | ORAL | 1 refills | Status: AC

## 2022-09-16 MED ORDER — MORPHINE SULFATE (PF) 2 MG/ML IV SOLN
2.0000 mg | INTRAVENOUS | Status: DC | PRN
  Administered 2022-09-16: 4 mg via INTRAVENOUS
  Filled 2022-09-16: qty 2

## 2022-09-16 MED ORDER — EPINEPHRINE 0.1 MG/10ML (10 MCG/ML) SYRINGE FOR IV PUSH (FOR BLOOD PRESSURE SUPPORT)
PREFILLED_SYRINGE | INTRAVENOUS | Status: DC | PRN
  Administered 2022-09-16 (×2): 30 ug via INTRAVENOUS
  Administered 2022-09-16: 20 ug via INTRAVENOUS

## 2022-09-16 MED ORDER — GLUCAGON HCL RDNA (DIAGNOSTIC) 1 MG IJ SOLR
1.0000 mg | Freq: Once | INTRAMUSCULAR | Status: AC
  Administered 2022-09-16: 1 mg via INTRAVENOUS
  Filled 2022-09-16: qty 1

## 2022-09-16 MED ORDER — SODIUM CHLORIDE 0.9 % IV BOLUS
500.0000 mL | Freq: Once | INTRAVENOUS | Status: AC
  Administered 2022-09-16: 500 mL via INTRAVENOUS

## 2022-09-16 MED ORDER — ATROPINE SULFATE 1 MG/10ML IJ SOSY
PREFILLED_SYRINGE | INTRAMUSCULAR | Status: DC | PRN
  Administered 2022-09-16: 1 mg via INTRAVENOUS

## 2022-09-16 MED ORDER — EPINEPHRINE HCL 5 MG/250ML IV SOLN IN NS
0.5000 ug/min | INTRAVENOUS | Status: DC
  Administered 2022-09-16: 0.5 ug/min via INTRAVENOUS
  Filled 2022-09-16: qty 250

## 2022-09-16 MED ORDER — SODIUM CHLORIDE 0.9 % IV BOLUS
1000.0000 mL | Freq: Once | INTRAVENOUS | Status: AC
  Administered 2022-09-16: 1000 mL via INTRAVENOUS

## 2022-09-16 MED ORDER — LORAZEPAM 2 MG/ML IJ SOLN
2.0000 mg | INTRAMUSCULAR | Status: DC | PRN
  Administered 2022-09-16 (×2): 2 mg via INTRAVENOUS
  Filled 2022-09-16 (×2): qty 1

## 2022-09-16 MED ORDER — CALCIUM GLUCONATE-NACL 1-0.675 GM/50ML-% IV SOLN
1.0000 g | Freq: Once | INTRAVENOUS | Status: AC
  Administered 2022-09-16: 1000 mg via INTRAVENOUS
  Filled 2022-09-16: qty 50

## 2022-09-16 MED ORDER — INSULIN ASPART 100 UNIT/ML IJ SOLN
5.0000 [IU] | Freq: Once | INTRAMUSCULAR | Status: AC
  Administered 2022-09-16: 5 [IU] via INTRAVENOUS
  Filled 2022-09-16: qty 1

## 2022-09-16 MED ORDER — SODIUM BICARBONATE 8.4 % IV SOLN
50.0000 meq | Freq: Once | INTRAVENOUS | Status: AC
  Filled 2022-09-16: qty 50

## 2022-09-16 MED ORDER — METRONIDAZOLE 500 MG/100ML IV SOLN
500.0000 mg | Freq: Once | INTRAVENOUS | Status: AC
  Administered 2022-09-16: 500 mg via INTRAVENOUS
  Filled 2022-09-16: qty 100

## 2022-09-16 MED ORDER — ONDANSETRON HCL 4 MG/2ML IJ SOLN
4.0000 mg | Freq: Once | INTRAMUSCULAR | Status: AC
  Administered 2022-09-16: 4 mg via INTRAVENOUS
  Filled 2022-09-16: qty 2

## 2022-09-16 NOTE — ED Notes (Signed)
ED Provider at bedside. 

## 2022-09-16 NOTE — Progress Notes (Deleted)
There were no vitals taken for this visit.   Subjective:    Patient ID: Evan Cones Sr., male    DOB: 1956/07/19, 67 y.o.   MRN: [id number]  HPI: Evan MINERD Sr. is a 67 y.o. male  No chief complaint on file.  Transition of Care Hospital Follow up.   Hospital/Facility: [facility] D/C Physician: Dr. Cyndia Skeeters D/C Date: 08/31/22  Records Requested: NA Records Received: Yes Records Reviewed: Yes  Diagnoses on Discharge:   Septic shock due to aspiration pneumonia: Patient with known history of aspiration in the setting of recurrent CVA.  Completed antibiotic course on 08/27/2022.  Currently on room air. -SLP recommended n.p.o. -Ordered oral suction for home use.   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 Recent Labs (within last 365 days)              Recent Labs    08/22/22 1650 08/23/22 0342 08/24/22 0239 08/25/22 0454 08/26/22 0357 08/27/22 0515 08/28/22 0543 08/29/22 0621 08/30/22 0523 08/31/22 0217  BUN 31* 27* '21 17 14 14 '$ 24* 29* 34* 32*  CREATININE 2.35* 2.19* 2.00* 1.96* 1.93* 1.95* 2.11* 2.26* 2.29* 2.05*    -Avoid nephrotoxic meds. -Continue free water as below. -Recheck in 1 week   Hyponatremia/hypernatremia: Resolved.   Essential hypertension: BP slightly elevated. -Continue amlodipine 10 mg daily -Added low-dose Coreg.   History of recurrent CVA with left hemiparesis, dysarthria and dysphagia -Continue home Eliquis, statin -PT/OT/SLP   Controlled NIDDM-2 with hyperglycemia and CKD-3A: A1c 6.8% in 06/2022.  Blood glucose within acceptable range. -Discontinued Farxiga given his poor renal function.   Anemia of chronic disease: H&H relatively stable. -Check CBC at follow-up.   Sinus bradycardia/paroxysmal A-fib: HR in the range of 70s to 80s.  On Eliquis at home. -Continue home Eliquis. -Added low-dose Coreg mainly for hypertension.   Severe dysphagia/severe protein calorie malnutrition.  Nutrition Problem:  Severe Malnutrition Etiology: chronic illness (CVA) Signs/Symptoms: percent weight loss, moderate fat depletion, severe fat depletion, moderate muscle depletion, severe muscle depletion Interventions: Refer to RD note for recommendations -SLP recommended NPO. -PEG tube placed on 08/26/2022. -Bolus feeding per recommendation by dietitian.  Date of interactive Contact within 48 hours of discharge:  Contact was through: phone  Date of 7 day or 14 day face-to-face visit:    within 14 days  Outpatient Encounter Medications as of 09/11/2022  Medication Sig   acetaminophen (TYLENOL) 160 MG/5ML solution Place 20.3 mLs (650 mg total) into feeding tube every 6 (six) hours as needed for mild pain or fever.   albuterol (VENTOLIN HFA) 108 (90 Base) MCG/ACT inhaler Inhale 2 puffs into the lungs every 4 (four) hours as needed for wheezing or shortness of breath.   amLODipine (NORVASC) 10 MG tablet Place 1 tablet (10 mg total) into feeding tube daily.   apixaban (ELIQUIS) 5 MG TABS tablet Place 1 tablet (5 mg total) into feeding tube 2 (two) times daily.   atorvastatin (LIPITOR) 80 MG tablet Place 1 tablet (80 mg total) into feeding tube every evening.   carvedilol (COREG) 3.125 MG tablet Place 1 tablet (3.125 mg total) into feeding tube 2 (two) times daily for 90 doses.   iron polysaccharides (NIFEREX) 150 MG capsule Place 1 capsule (150 mg total) into feeding tube daily.   metoCLOPramide (REGLAN) 5 MG tablet Place 1 tablet (5 mg total) into feeding tube every 6 (six) hours as needed for nausea.   mirtazapine (REMERON) 7.5 MG tablet  Place 1 tablet (7.5 mg total) into feeding tube daily.   Mouthwashes (MOUTH RINSE) LIQD solution 15 mLs by Mouth Rinse route in the morning, at noon, in the evening, and at bedtime.   nitroGLYCERIN (NITROSTAT) 0.4 MG SL tablet Place 1 tablet (0.4 mg total) under the tongue every 5 (five) minutes as needed for chest pain.   Nutritional Supplements (FEEDING SUPPLEMENT, OSMOLITE  1.5 CAL,) LIQD Place 237 mLs into feeding tube 6 (six) times daily.   omeprazole (KONVOMEP) 2 mg/mL SUSP oral suspension Place 20 mLs (40 mg total) into feeding tube daily.   Protein (FEEDING SUPPLEMENT, PROSOURCE TF20,) liquid Place 60 mLs into feeding tube daily.   tamsulosin (FLOMAX) 0.4 MG CAPS capsule Open capsule and put medication into feeding tube   Water For Irrigation, Sterile (FREE WATER) SOLN Place 200 mLs into feeding tube every 4 (four) hours.   No facility-administered encounter medications on file as of 09/11/2022.    Diagnostic Tests Reviewed/Disposition: Reviewed  Consults: Working with Speech Therapy  Discharge Instructions: Reviewed  Disease/illness Education: Provided during visit  Home Health/Community Services Discussions/Referrals: Discussed with patient's wife  Establishment or re-establishment of referral orders for community resources: Receiving home health therapy.   Discussion with other health care providers: NA  Assessment and Support of treatment regimen adherence: Provided during visit  Appointments Coordinated with: Patient's wife  Education for self-management, independent living, and ADLs: Discussed during visit  Relevant past medical, surgical, family and social history reviewed and updated as indicated. Interim medical history since our last visit reviewed. Allergies and medications reviewed and updated.  Review of Systems  Per HPI unless specifically indicated above     Objective:    There were no vitals taken for this visit.  Wt Readings from Last 3 Encounters:  08/30/22 139 lb 8.8 oz (63.3 kg)  06/24/22 164 lb 12.8 oz (74.8 kg)  05/22/22 158 lb 4 oz (71.8 kg)    Physical Exam  Results for orders placed or performed during the hospital encounter of 08/21/22  Culture, blood (Routine x 2)   Specimen: BLOOD  Result Value Ref Range   Specimen Description BLOOD BLOOD LEFT HAND    Special Requests      BOTTLES DRAWN AEROBIC AND  ANAEROBIC Blood Culture adequate volume   Culture      NO GROWTH 5 DAYS Performed at [facility], [address] [place], [place] [address]    Report Status 08/26/2022 FINAL   Culture, blood (Routine x 2)   Specimen: BLOOD  Result Value Ref Range   Specimen Description BLOOD BLOOD LEFT HAND    Special Requests      BOTTLES DRAWN AEROBIC AND ANAEROBIC Blood Culture results may not be optimal due to an inadequate volume of blood received in culture bottles   Culture      NO GROWTH 5 DAYS Performed at [facility], [address] [place], [place] [address]    Report Status 08/26/2022 FINAL   Resp panel by RT-PCR (RSV, Flu A&B, Covid) Anterior Nasal Swab   Specimen: Anterior Nasal Swab  Result Value Ref Range   SARS Coronavirus 2 by RT PCR NEGATIVE NEGATIVE   Influenza A by PCR NEGATIVE NEGATIVE   Influenza B by PCR NEGATIVE NEGATIVE   Resp Syncytial Virus by PCR NEGATIVE NEGATIVE  Comprehensive metabolic panel  Result Value Ref Range   Sodium 143 135 - 145 mmol/L   Potassium 4.3 3.5 - 5.1 mmol/L   Chloride 114 (H) 98 -  111 mmol/L   CO2 20 (L) 22 - 32 mmol/L   Glucose, Bld 80 70 - 99 mg/dL   BUN 36 (H) 8 - 23 mg/dL   Creatinine, Ser 2.53 (H) 0.61 - 1.24 mg/dL   Calcium 8.5 (L) 8.9 - 10.3 mg/dL   Total Protein 8.1 6.5 - 8.1 g/dL   Albumin 3.2 (L) 3.5 - 5.0 g/dL   AST 41 15 - 41 U/L   ALT 68 (H) 0 - 44 U/L   Alkaline Phosphatase 172 (H) 38 - 126 U/L   Total Bilirubin 0.5 0.3 - 1.2 mg/dL   GFR, Estimated 27 (L) >60 mL/min   Anion gap 9 5 - 15  Lactic acid, plasma  Result Value Ref Range   Lactic Acid, Venous 1.2 0.5 - 1.9 mmol/L  Lactic acid, plasma  Result Value Ref Range   Lactic Acid, Venous 0.5 0.5 - 1.9 mmol/L  CBC with Differential  Result Value Ref Range   WBC 3.2 (L) 4.0 - 10.5 K/uL   RBC 3.50 (L) 4.22 - 5.81 MIL/uL   Hemoglobin 9.2 (L) 13.0 - 17.0 g/dL   HCT 30.9 (L) 39.0 - 52.0 %   MCV 88.3 80.0 - 100.0 fL   MCH 26.3 26.0 - 34.0 pg    MCHC 29.8 (L) 30.0 - 36.0 g/dL   RDW 19.8 (H) 11.5 - 15.5 %   Platelets 143 (L) 150 - 400 K/uL   nRBC 0.9 (H) 0.0 - 0.2 %   Neutrophils Relative % 76 %   Neutro Abs 2.4 1.7 - 7.7 K/uL   Lymphocytes Relative 16 %   Lymphs Abs 0.5 (L) 0.7 - 4.0 K/uL   Monocytes Relative 7 %   Monocytes Absolute 0.2 0.1 - 1.0 K/uL   Eosinophils Relative 1 %   Eosinophils Absolute 0.0 0.0 - 0.5 K/uL   Basophils Relative 0 %   Basophils Absolute 0.0 0.0 - 0.1 K/uL   Immature Granulocytes 0 %   Abs Immature Granulocytes 0.01 0.00 - 0.07 K/uL  Protime-INR  Result Value Ref Range   Prothrombin Time 19.0 (H) 11.4 - 15.2 seconds   INR 1.6 (H) 0.8 - 1.2  Procalcitonin - Baseline  Result Value Ref Range   Procalcitonin 0.20 ng/mL  CBC  Result Value Ref Range   WBC 3.5 (L) 4.0 - 10.5 K/uL   RBC 3.11 (L) 4.22 - 5.81 MIL/uL   Hemoglobin 8.4 (L) 13.0 - 17.0 g/dL   HCT 27.3 (L) 39.0 - 52.0 %   MCV 87.8 80.0 - 100.0 fL   MCH 27.0 26.0 - 34.0 pg   MCHC 30.8 30.0 - 36.0 g/dL   RDW 19.7 (H) 11.5 - 15.5 %   Platelets 132 (L) 150 - 400 K/uL   nRBC 0.8 (H) 0.0 - 0.2 %  Basic metabolic panel  Result Value Ref Range   Sodium 145 135 - 145 mmol/L   Potassium 4.0 3.5 - 5.1 mmol/L   Chloride 118 (H) 98 - 111 mmol/L   CO2 18 (L) 22 - 32 mmol/L   Glucose, Bld 85 70 - 99 mg/dL   BUN 31 (H) 8 - 23 mg/dL   Creatinine, Ser 2.35 (H) 0.61 - 1.24 mg/dL   Calcium 8.3 (L) 8.9 - 10.3 mg/dL   GFR, Estimated 30 (L) >60 mL/min   Anion gap 9 5 - 15  Glucose, capillary  Result Value Ref Range   Glucose-Capillary 90 70 - 99 mg/dL  Comprehensive metabolic panel  Result Value Ref  Range   Sodium 146 (H) 135 - 145 mmol/L   Potassium 4.0 3.5 - 5.1 mmol/L   Chloride 119 (H) 98 - 111 mmol/L   CO2 18 (L) 22 - 32 mmol/L   Glucose, Bld 55 (L) 70 - 99 mg/dL   BUN 27 (H) 8 - 23 mg/dL   Creatinine, Ser 2.19 (H) 0.61 - 1.24 mg/dL   Calcium 8.5 (L) 8.9 - 10.3 mg/dL   Total Protein 7.1 6.5 - 8.1 g/dL   Albumin 2.7 (L) 3.5 - 5.0 g/dL    AST 84 (H) 15 - 41 U/L   ALT 96 (H) 0 - 44 U/L   Alkaline Phosphatase 171 (H) 38 - 126 U/L   Total Bilirubin 0.6 0.3 - 1.2 mg/dL   GFR, Estimated 33 (L) >60 mL/min   Anion gap 9 5 - 15  CBC  Result Value Ref Range   WBC 3.8 (L) 4.0 - 10.5 K/uL   RBC 3.09 (L) 4.22 - 5.81 MIL/uL   Hemoglobin 8.4 (L) 13.0 - 17.0 g/dL   HCT 27.0 (L) 39.0 - 52.0 %   MCV 87.4 80.0 - 100.0 fL   MCH 27.2 26.0 - 34.0 pg   MCHC 31.1 30.0 - 36.0 g/dL   RDW 19.9 (H) 11.5 - 15.5 %   Platelets 136 (L) 150 - 400 K/uL   nRBC 1.3 (H) 0.0 - 0.2 %  Glucose, capillary  Result Value Ref Range   Glucose-Capillary 85 70 - 99 mg/dL  Glucose, capillary  Result Value Ref Range   Glucose-Capillary 69 (L) 70 - 99 mg/dL  Glucose, capillary  Result Value Ref Range   Glucose-Capillary 83 70 - 99 mg/dL  Glucose, capillary  Result Value Ref Range   Glucose-Capillary 95 70 - 99 mg/dL  Glucose, capillary  Result Value Ref Range   Glucose-Capillary 93 70 - 99 mg/dL  CBC  Result Value Ref Range   WBC 4.7 4.0 - 10.5 K/uL   RBC 3.16 (L) 4.22 - 5.81 MIL/uL   Hemoglobin 8.5 (L) 13.0 - 17.0 g/dL   HCT 27.5 (L) 39.0 - 52.0 %   MCV 87.0 80.0 - 100.0 fL   MCH 26.9 26.0 - 34.0 pg   MCHC 30.9 30.0 - 36.0 g/dL   RDW 19.1 (H) 11.5 - 15.5 %   Platelets 120 (L) 150 - 400 K/uL   nRBC 0.8 (H) 0.0 - 0.2 %  Comprehensive metabolic panel  Result Value Ref Range   Sodium 145 135 - 145 mmol/L   Potassium 4.0 3.5 - 5.1 mmol/L   Chloride 116 (H) 98 - 111 mmol/L   CO2 20 (L) 22 - 32 mmol/L   Glucose, Bld 66 (L) 70 - 99 mg/dL   BUN 21 8 - 23 mg/dL   Creatinine, Ser 2.00 (H) 0.61 - 1.24 mg/dL   Calcium 8.7 (L) 8.9 - 10.3 mg/dL   Total Protein 7.0 6.5 - 8.1 g/dL   Albumin 2.6 (L) 3.5 - 5.0 g/dL   AST 86 (H) 15 - 41 U/L   ALT 102 (H) 0 - 44 U/L   Alkaline Phosphatase 172 (H) 38 - 126 U/L   Total Bilirubin 0.7 0.3 - 1.2 mg/dL   GFR, Estimated 36 (L) >60 mL/min   Anion gap 9 5 - 15  Glucose, capillary  Result Value Ref Range    Glucose-Capillary 91 70 - 99 mg/dL  Glucose, capillary  Result Value Ref Range   Glucose-Capillary 68 (L) 70 - 99 mg/dL  Glucose, capillary  Result Value Ref Range   Glucose-Capillary 86 70 - 99 mg/dL  Glucose, capillary  Result Value Ref Range   Glucose-Capillary 108 (H) 70 - 99 mg/dL  CBC  Result Value Ref Range   WBC 5.2 4.0 - 10.5 K/uL   RBC 3.28 (L) 4.22 - 5.81 MIL/uL   Hemoglobin 8.9 (L) 13.0 - 17.0 g/dL   HCT 28.2 (L) 39.0 - 52.0 %   MCV 86.0 80.0 - 100.0 fL   MCH 27.1 26.0 - 34.0 pg   MCHC 31.6 30.0 - 36.0 g/dL   RDW 18.9 (H) 11.5 - 15.5 %   Platelets 145 (L) 150 - 400 K/uL   nRBC 0.6 (H) 0.0 - 0.2 %  Basic metabolic panel  Result Value Ref Range   Sodium 144 135 - 145 mmol/L   Potassium 4.0 3.5 - 5.1 mmol/L   Chloride 115 (H) 98 - 111 mmol/L   CO2 22 22 - 32 mmol/L   Glucose, Bld 93 70 - 99 mg/dL   BUN 17 8 - 23 mg/dL   Creatinine, Ser 1.96 (H) 0.61 - 1.24 mg/dL   Calcium 9.0 8.9 - 10.3 mg/dL   GFR, Estimated 37 (L) >60 mL/min   Anion gap 7 5 - 15  Glucose, capillary  Result Value Ref Range   Glucose-Capillary 81 70 - 99 mg/dL  Glucose, capillary  Result Value Ref Range   Glucose-Capillary 110 (H) 70 - 99 mg/dL  Glucose, capillary  Result Value Ref Range   Glucose-Capillary 90 70 - 99 mg/dL  Glucose, capillary  Result Value Ref Range   Glucose-Capillary 129 (H) 70 - 99 mg/dL  Basic metabolic panel  Result Value Ref Range   Sodium 146 (H) 135 - 145 mmol/L   Potassium 4.0 3.5 - 5.1 mmol/L   Chloride 117 (H) 98 - 111 mmol/L   CO2 22 22 - 32 mmol/L   Glucose, Bld 130 (H) 70 - 99 mg/dL   BUN 14 8 - 23 mg/dL   Creatinine, Ser 1.93 (H) 0.61 - 1.24 mg/dL   Calcium 8.9 8.9 - 10.3 mg/dL   GFR, Estimated 38 (L) >60 mL/min   Anion gap 7 5 - 15  Glucose, capillary  Result Value Ref Range   Glucose-Capillary 123 (H) 70 - 99 mg/dL  Glucose, capillary  Result Value Ref Range   Glucose-Capillary 159 (H) 70 - 99 mg/dL  Glucose, capillary  Result Value Ref Range    Glucose-Capillary 123 (H) 70 - 99 mg/dL  Magnesium  Result Value Ref Range   Magnesium 1.8 1.7 - 2.4 mg/dL  Magnesium  Result Value Ref Range   Magnesium 1.7 1.7 - 2.4 mg/dL  Phosphorus  Result Value Ref Range   Phosphorus 3.9 2.5 - 4.6 mg/dL  Phosphorus  Result Value Ref Range   Phosphorus 3.7 2.5 - 4.6 mg/dL  Glucose, capillary  Result Value Ref Range   Glucose-Capillary 79 70 - 99 mg/dL  Glucose, capillary  Result Value Ref Range   Glucose-Capillary 94 70 - 99 mg/dL  Basic metabolic panel  Result Value Ref Range   Sodium 145 135 - 145 mmol/L   Potassium 4.1 3.5 - 5.1 mmol/L   Chloride 116 (H) 98 - 111 mmol/L   CO2 19 (L) 22 - 32 mmol/L   Glucose, Bld 69 (L) 70 - 99 mg/dL   BUN 14 8 - 23 mg/dL   Creatinine, Ser 1.95 (H) 0.61 - 1.24 mg/dL   Calcium 8.7 (L) 8.9 -  10.3 mg/dL   GFR, Estimated 37 (L) >60 mL/min   Anion gap 10 5 - 15  CBC  Result Value Ref Range   WBC 6.0 4.0 - 10.5 K/uL   RBC 3.10 (L) 4.22 - 5.81 MIL/uL   Hemoglobin 8.2 (L) 13.0 - 17.0 g/dL   HCT 27.0 (L) 39.0 - 52.0 %   MCV 87.1 80.0 - 100.0 fL   MCH 26.5 26.0 - 34.0 pg   MCHC 30.4 30.0 - 36.0 g/dL   RDW 18.9 (H) 11.5 - 15.5 %   Platelets 117 (L) 150 - 400 K/uL   nRBC 0.3 (H) 0.0 - 0.2 %  Magnesium  Result Value Ref Range   Magnesium 1.8 1.7 - 2.4 mg/dL  Phosphorus  Result Value Ref Range   Phosphorus 3.6 2.5 - 4.6 mg/dL  Glucose, capillary  Result Value Ref Range   Glucose-Capillary 81 70 - 99 mg/dL  Magnesium  Result Value Ref Range   Magnesium 1.8 1.7 - 2.4 mg/dL  Phosphorus  Result Value Ref Range   Phosphorus 3.6 2.5 - 4.6 mg/dL  Glucose, capillary  Result Value Ref Range   Glucose-Capillary 97 70 - 99 mg/dL  Glucose, capillary  Result Value Ref Range   Glucose-Capillary 78 70 - 99 mg/dL  Glucose, capillary  Result Value Ref Range   Glucose-Capillary 69 (L) 70 - 99 mg/dL  Glucose, capillary  Result Value Ref Range   Glucose-Capillary 162 (H) 70 - 99 mg/dL  CBC  Result Value  Ref Range   WBC 6.8 4.0 - 10.5 K/uL   RBC 3.07 (L) 4.22 - 5.81 MIL/uL   Hemoglobin 8.2 (L) 13.0 - 17.0 g/dL   HCT 26.9 (L) 39.0 - 52.0 %   MCV 87.6 80.0 - 100.0 fL   MCH 26.7 26.0 - 34.0 pg   MCHC 30.5 30.0 - 36.0 g/dL   RDW 18.6 (H) 11.5 - 15.5 %   Platelets 144 (L) 150 - 400 K/uL   nRBC 0.0 0.0 - 0.2 %  Basic metabolic panel  Result Value Ref Range   Sodium 145 135 - 145 mmol/L   Potassium 4.3 3.5 - 5.1 mmol/L   Chloride 115 (H) 98 - 111 mmol/L   CO2 22 22 - 32 mmol/L   Glucose, Bld 161 (H) 70 - 99 mg/dL   BUN 24 (H) 8 - 23 mg/dL   Creatinine, Ser 2.11 (H) 0.61 - 1.24 mg/dL   Calcium 8.4 (L) 8.9 - 10.3 mg/dL   GFR, Estimated 34 (L) >60 mL/min   Anion gap 8 5 - 15  Glucose, capillary  Result Value Ref Range   Glucose-Capillary 152 (H) 70 - 99 mg/dL  Glucose, capillary  Result Value Ref Range   Glucose-Capillary 171 (H) 70 - 99 mg/dL  Glucose, capillary  Result Value Ref Range   Glucose-Capillary 178 (H) 70 - 99 mg/dL  Glucose, capillary  Result Value Ref Range   Glucose-Capillary 129 (H) 70 - 99 mg/dL  Glucose, capillary  Result Value Ref Range   Glucose-Capillary 126 (H) 70 - 99 mg/dL  Basic metabolic panel  Result Value Ref Range   Sodium 147 (H) 135 - 145 mmol/L   Potassium 4.2 3.5 - 5.1 mmol/L   Chloride 116 (H) 98 - 111 mmol/L   CO2 23 22 - 32 mmol/L   Glucose, Bld 83 70 - 99 mg/dL   BUN 29 (H) 8 - 23 mg/dL   Creatinine, Ser 2.26 (H) 0.61 - 1.24 mg/dL  Calcium 8.5 (L) 8.9 - 10.3 mg/dL   GFR, Estimated 31 (L) >60 mL/min   Anion gap 8 5 - 15  CBC  Result Value Ref Range   WBC 8.5 4.0 - 10.5 K/uL   RBC 2.77 (L) 4.22 - 5.81 MIL/uL   Hemoglobin 7.4 (L) 13.0 - 17.0 g/dL   HCT 24.3 (L) 39.0 - 52.0 %   MCV 87.7 80.0 - 100.0 fL   MCH 26.7 26.0 - 34.0 pg   MCHC 30.5 30.0 - 36.0 g/dL   RDW 18.7 (H) 11.5 - 15.5 %   Platelets 153 150 - 400 K/uL   nRBC 0.0 0.0 - 0.2 %  Magnesium  Result Value Ref Range   Magnesium 2.2 1.7 - 2.4 mg/dL  Glucose, capillary   Result Value Ref Range   Glucose-Capillary 131 (H) 70 - 99 mg/dL  Glucose, capillary  Result Value Ref Range   Glucose-Capillary 228 (H) 70 - 99 mg/dL  Glucose, capillary  Result Value Ref Range   Glucose-Capillary 213 (H) 70 - 99 mg/dL  Basic metabolic panel  Result Value Ref Range   Sodium 145 135 - 145 mmol/L   Potassium 4.7 3.5 - 5.1 mmol/L   Chloride 115 (H) 98 - 111 mmol/L   CO2 24 22 - 32 mmol/L   Glucose, Bld 134 (H) 70 - 99 mg/dL   BUN 34 (H) 8 - 23 mg/dL   Creatinine, Ser 2.29 (H) 0.61 - 1.24 mg/dL   Calcium 8.3 (L) 8.9 - 10.3 mg/dL   GFR, Estimated 31 (L) >60 mL/min   Anion gap 6 5 - 15  CBC  Result Value Ref Range   WBC 9.0 4.0 - 10.5 K/uL   RBC 2.78 (L) 4.22 - 5.81 MIL/uL   Hemoglobin 7.4 (L) 13.0 - 17.0 g/dL   HCT 24.4 (L) 39.0 - 52.0 %   MCV 87.8 80.0 - 100.0 fL   MCH 26.6 26.0 - 34.0 pg   MCHC 30.3 30.0 - 36.0 g/dL   RDW 18.6 (H) 11.5 - 15.5 %   Platelets 160 150 - 400 K/uL   nRBC 0.0 0.0 - 0.2 %  Magnesium  Result Value Ref Range   Magnesium 2.4 1.7 - 2.4 mg/dL  Glucose, capillary  Result Value Ref Range   Glucose-Capillary 194 (H) 70 - 99 mg/dL  Glucose, capillary  Result Value Ref Range   Glucose-Capillary 200 (H) 70 - 99 mg/dL  Hemoglobin A1c  Result Value Ref Range   Hgb A1c MFr Bld 6.3 (H) 4.8 - 5.6 %   Mean Plasma Glucose 134.11 mg/dL  CBC  Result Value Ref Range   WBC 11.4 (H) 4.0 - 10.5 K/uL   RBC 2.85 (L) 4.22 - 5.81 MIL/uL   Hemoglobin 7.6 (L) 13.0 - 17.0 g/dL   HCT 24.9 (L) 39.0 - 52.0 %   MCV 87.4 80.0 - 100.0 fL   MCH 26.7 26.0 - 34.0 pg   MCHC 30.5 30.0 - 36.0 g/dL   RDW 17.9 (H) 11.5 - 15.5 %   Platelets 195 150 - 400 K/uL   nRBC 0.0 0.0 - 0.2 %  Magnesium  Result Value Ref Range   Magnesium 2.3 1.7 - 2.4 mg/dL  Vitamin B12  Result Value Ref Range   Vitamin B-12 714 180 - 914 pg/mL  Folate  Result Value Ref Range   Folate 19.2 >5.9 ng/mL  Iron and TIBC  Result Value Ref Range   Iron 17 (L) 45 - 182 ug/dL  TIBC 216  (L) 250 - 450 ug/dL   Saturation Ratios 8 (L) 17.9 - 39.5 %   UIBC 199 ug/dL  Ferritin  Result Value Ref Range   Ferritin 425 (H) 24 - 336 ng/mL  Reticulocytes  Result Value Ref Range   Retic Ct Pct 1.2 0.4 - 3.1 %   RBC. 2.87 (L) 4.22 - 5.81 MIL/uL   Retic Count, Absolute 33.9 19.0 - 186.0 K/uL   Immature Retic Fract 28.5 (H) 2.3 - 15.9 %  Renal function panel  Result Value Ref Range   Sodium 142 135 - 145 mmol/L   Potassium 5.1 3.5 - 5.1 mmol/L   Chloride 111 98 - 111 mmol/L   CO2 23 22 - 32 mmol/L   Glucose, Bld 127 (H) 70 - 99 mg/dL   BUN 32 (H) 8 - 23 mg/dL   Creatinine, Ser 2.05 (H) 0.61 - 1.24 mg/dL   Calcium 8.7 (L) 8.9 - 10.3 mg/dL   Phosphorus 3.4 2.5 - 4.6 mg/dL   Albumin 2.8 (L) 3.5 - 5.0 g/dL   GFR, Estimated 35 (L) >60 mL/min   Anion gap 8 5 - 15  Glucose, capillary  Result Value Ref Range   Glucose-Capillary 117 (H) 70 - 99 mg/dL  Glucose, capillary  Result Value Ref Range   Glucose-Capillary 109 (H) 70 - 99 mg/dL  Glucose, capillary  Result Value Ref Range   Glucose-Capillary 111 (H) 70 - 99 mg/dL  Glucose, capillary  Result Value Ref Range   Glucose-Capillary 116 (H) 70 - 99 mg/dL  Glucose, capillary  Result Value Ref Range   Glucose-Capillary 112 (H) 70 - 99 mg/dL  Glucose, capillary  Result Value Ref Range   Glucose-Capillary 188 (H) 70 - 99 mg/dL  CBG monitoring, ED  Result Value Ref Range   Glucose-Capillary 85 70 - 99 mg/dL  CBG monitoring, ED  Result Value Ref Range   Glucose-Capillary 63 (L) 70 - 99 mg/dL  CBG monitoring, ED  Result Value Ref Range   Glucose-Capillary 66 (L) 70 - 99 mg/dL  CBG monitoring, ED  Result Value Ref Range   Glucose-Capillary 89 70 - 99 mg/dL      Assessment & Plan:   Problem List Items Addressed This Visit       Respiratory   Aspiration pneumonia (HCC) - Primary     Digestive   Dysphagia, post-stroke     Endocrine   Controlled type 2 diabetes mellitus with hyperglycemia ([place])   CKD stage 3 due to  type 2 diabetes mellitus (HCC)     Other   PEG (percutaneous endoscopic gastrostomy) status ([place])     Follow up plan: No follow-ups on file.

## 2022-09-16 NOTE — ED Notes (Signed)
Pt's spouse called this RN to bedside, states, "nurse, I'm ready to let my husband go." This RN confirms with her that she is ready to withdrawal care and confirms that she would like this RN to administer ativan and morphine to help keep pt. Comfortable as care is withdrawn. Spouse states yes, she would like epi drip withdrawn and medications administered. See MAR.

## 2022-09-16 NOTE — Hospital Course (Signed)
Admission for end of life/ comfort measures and hospice. Pt was pressors / hyperkalemia/ ? CVA.  AMS. Pt was DNR and DNI.

## 2022-09-16 NOTE — ED Notes (Signed)
Pt's entire family invited from family waiting room to ED rm to join pt's spouse.

## 2022-09-16 NOTE — Progress Notes (Signed)
Code Stroke cart activation @ 1332.  LKWT 0700.  Pt is on Eliquis.  ED staff stabilizing the patient for CT.  Dr. Quinn Axe paged @ 1340.  Circumstances surrounding the case communicated by phone @ 1344 to Dr. Quinn Axe.  Pt. left for CT @ 1344.  Quinn Axe in [place] @ 1347.  TSRN off camera @ 1245 per Quinn Axe.   Jarold Song BSN, Horticulturist, commercial

## 2022-09-16 NOTE — ED Notes (Signed)
Called and activated code stroke per Dr. Ellender Hose to [place] 1345

## 2022-09-16 NOTE — Code Documentation (Addendum)
  Stroke Response Nurse Documentation Code Documentation  Evan GUARD Sr. is a 68 y.o. male arriving to [facility] via [facility] on 08/29/2022 with past medical hx of HTN, hypercholesterolemia, DM, recurrent strokes, baseline left sided weakness, diastolic dysfunction, CAD, tobacco use, PAF. On Eliquis (apixaban) daily. Code stroke was activated by ED.   Patient from home where he was LKW at 0700 and now complaining of altered mental status. Patient's primary nurse explains that wife administered patient's medications this morning at 0700 where he was seen at his baseline. Patient went to sleep and around 1000 patient was noted to be altered and difficult to around by wife, and brought in for evaluation  Stroke team met patient and care team in [place] after activation. Labs drawn and patient cleared for CT by Dr. Myrene Buddy. Patient to CT with team. NIHSS 28, see documentation for details and code stroke times. Patient with decreased LOC, disoriented, not following commands, bilateral arm weakness, bilateral leg weakness, bilateral decreased sensation, and Global aphasia  on exam. The following imaging was completed:  CT Head, CTA, and CTP. Patient is not a candidate for IV Thrombolytic due to outside window,  per MD. Patient is not a candidate for IR due to no LVO seen on imaging per MD.   Care Plan: q2h NIHSS and vital signs.   Bedside handoff with ED RN Elana.    Charise Carwin  Stroke Response RN

## 2022-09-16 NOTE — Progress Notes (Signed)
RT was able to suction out a copious amount of secretions from the patients airway with the yankauer suction. Pt tolerated well.

## 2022-09-16 NOTE — ED Notes (Signed)
pt. to ED via POV for AMS. pt. pulled from veh by ED staff. pt. moans with pain, otherwise completely unresponsive. LKW was 0700 today, first notice of symptoms 1000. Spouse states she woke him up at 1000 attempting to get him changed and ready for his dr's appointment. Spouse states pt. Was continuously moving his bowels (liquid) and urinating on himself. Spouse denies any flickering eye movements, or jerking extremities. Spouse states it took her 2 hours to get him ready for his appointment and when she got to the dr's office and asked for help getting him out of the car, the office told her to drive on to the ED.

## 2022-09-16 NOTE — Telephone Encounter (Signed)
Please let patient's wife know that we are reaching out to see if the insurance company will accept [facility] hospital notes for his nutrition.  They wanted a note from me but I haven't seen him since having it placed.  I went through his medication list and was able to change the Reglan to suspension however, the rest of the medications will have to be crushed and put through his peg tube.  There are not suspension options for the rest of his medications.

## 2022-09-16 NOTE — ED Provider Notes (Signed)
[facility] Provider Note    Event Date/Time   First MD Initiated Contact with Patient 08/26/2022 1340     (approximate)   History   Altered Mental Status   HPI  Evan SCHACHTER Sr. is a 67 y.o. male   Here with altered mental status and decreased responsiveness.  Patient arrives via POV.  He essentially is unresponsive, limiting history.  Patient was just admitted for aspiration pneumonia during which time he was converted to essentially tube feeds and was made DNR.  However, he would want medical treatment.  Wife states that he was in his usual state of health this morning.  However, throughout the day today has been less responsive and then essentially would not even move to help her get him into the car.  This is not normal for him.  No recent medication changes other than the Coreg which was started during his previous admission.      Physical Exam   Triage Vital Signs: ED Triage Vitals  Enc Vitals Group     BP      Pulse      Resp      Temp      Temp src      SpO2      Weight      Height      Head Circumference      Peak Flow      Pain Score      Pain Loc      Pain Edu?      Excl. in [place]?     Most recent vital signs: Vitals:   09/08/2022 1800 09/03/2022 1815  BP:    Pulse: (!) 46 (!) 38  Resp:    SpO2: 90% (!) 84%     General: GCS 7-8, minimally responsive. CV:  Good peripheral perfusion.  Bradycardic. Resp:  Normal effort.  Regular respirations with clear breath sounds. Abd:  No distention.  Other:  No spontaneous movement noted.  Patient does have gag reflex.  He is breathing spontaneously.   ED Results / Procedures / Treatments   Labs (all labs ordered are listed, but only abnormal results are displayed) Labs Reviewed  CBC WITH DIFFERENTIAL/PLATELET - Abnormal; Notable for the following components:      Result Value   RBC 3.04 (*)    Hemoglobin 8.0 (*)    HCT 25.7 (*)    RDW 18.6 (*)    nRBC 2.1 (*)    All other  components within normal limits  COMPREHENSIVE METABOLIC PANEL - Abnormal; Notable for the following components:   Sodium 125 (*)    Potassium 6.1 (*)    CO2 15 (*)    Glucose, Bld 155 (*)    BUN 85 (*)    Creatinine, Ser 3.89 (*)    Calcium 8.4 (*)    Albumin 2.7 (*)    AST 57 (*)    ALT 71 (*)    Alkaline Phosphatase 164 (*)    GFR, Estimated 16 (*)    All other components within normal limits  BLOOD GAS, ARTERIAL - Abnormal; Notable for the following components:   pH, Arterial 7.23 (*)    pCO2 arterial 29 (*)    Bicarbonate 12.1 (*)    Acid-base deficit 14.0 (*)    All other components within normal limits  CBG MONITORING, ED - Abnormal; Notable for the following components:   Glucose-Capillary 162 (*)    All other components within normal  limits  CBG MONITORING, ED - Abnormal; Notable for the following components:   Glucose-Capillary 238 (*)    All other components within normal limits  RESP PANEL BY RT-PCR (RSV, FLU A&B, COVID)  RVPGX2  CULTURE, BLOOD (ROUTINE X 2)  CULTURE, BLOOD (ROUTINE X 2)  LACTIC ACID, PLASMA  ETHANOL  LACTIC ACID, PLASMA  URINE DRUG SCREEN, QUALITATIVE ([facility] ONLY)  URINALYSIS, COMPLETE (UACMP) WITH MICROSCOPIC  BASIC METABOLIC PANEL  TROPONIN I (HIGH SENSITIVITY)     EKG Sinus bradycardia, ventricular rate 40.  QRS 180, QTc 443.  No acute ST elevations or depressions.  No acute evidence of acute ischemia or infarct.  RADIOLOGY CT head: No acute intracranial process CT angio: No acute large vessel occlusions Chest x-ray: Persistent left basilar opacity   I also independently reviewed and agree with radiologist interpretations.   PROCEDURES:  Critical Care performed: Yes, see critical care procedure note(s)  .Critical Care  Performed by: Duffy Bruce, MD Authorized by: Duffy Bruce, MD   Critical care provider statement:    Critical care time (minutes):  30   Critical care was necessary to treat or prevent imminent or  life-threatening deterioration of the following conditions:  Respiratory failure, cardiac failure, circulatory failure and sepsis   Critical care was time spent personally by me on the following activities:  Development of treatment plan with patient or surrogate, discussions with consultants, evaluation of patient's response to treatment, examination of patient, ordering and review of laboratory studies, ordering and review of radiographic studies, ordering and performing treatments and interventions, pulse oximetry, re-evaluation of patient's condition and review of old charts     MEDICATIONS ORDERED IN ED: Medications  atropine 1 MG/10ML injection (1 mg Intravenous Given 2022/09/22 1330)  EPINEPhrine 10 mcg/mL Adult IV Push Syringe (For Blood Pressure Support) (30 mcg Intravenous Given Sep 22, 2022 1354)  EPINEPHrine (ADRENALIN) 5 mg in NS 250 mL (0.02 mg/mL) premix infusion (0 mcg/min Intravenous Stopped 2022/09/22 1744)  vancomycin (VANCOREADY) IVPB 1500 mg/300 mL (1,500 mg Intravenous Not Given 09-22-22 1607)  morphine (PF) 2 MG/ML injection 2-4 mg (4 mg Intravenous Given 2022-09-22 1744)  LORazepam (ATIVAN) injection 2 mg (2 mg Intravenous Given September 22, 2022 1744)  iohexol (OMNIPAQUE) 350 MG/ML injection 100 mL (100 mLs Intravenous Contrast Given 2022-09-22 1357)  glucagon (human recombinant) (GLUCAGEN) injection 1 mg (1 mg Intravenous Given 2022/09/22 1505)  ondansetron (ZOFRAN) injection 4 mg (4 mg Intravenous Given September 22, 2022 1526)  sodium chloride 0.9 % bolus 1,000 mL (0 mLs Intravenous Stopped 09-22-2022 1527)  metroNIDAZOLE (FLAGYL) IVPB 500 mg (0 mg Intravenous Stopped 09/22/22 1648)  sodium chloride 0.9 % bolus 1,000 mL (0 mLs Intravenous Stopped 09/22/2022 1528)  ceFEPIme (MAXIPIME) 2 g in sodium chloride 0.9 % 100 mL IVPB (0 g Intravenous Stopped 09/22/22 1554)  calcium gluconate 1 g/ 50 mL sodium chloride IVPB (0 mg Intravenous Stopped 09/22/2022 1528)  insulin aspart (novoLOG) injection 5 Units (5 Units Intravenous  Given 09/22/2022 1509)  sodium bicarbonate injection 50 mEq (50 mEq Intravenous Given 09/22/22 1441)  dextrose 50 % solution 50 mL (50 mLs Intravenous Given 22-Sep-2022 1508)  sodium chloride 0.9 % bolus 500 mL (0 mLs Intravenous Stopped September 22, 2022 1648)  glycopyrrolate (ROBINUL) injection 0.1 mg (0.1 mg Intravenous Given 2022-09-22 1840)     IMPRESSION / MDM / [place] / ED COURSE  I reviewed the triage vital signs and the nursing notes.  Differential diagnosis includes, but is not limited to, stroke, ICH, metabolic encephalopathy, shock with hypoperfusion related to sepsis or bradycardia or CHF, ACS.  Patient's presentation is most consistent with acute presentation with potential threat to life or bodily function.  The patient is on the cardiac monitor to evaluate for evidence of arrhythmia and/or significant heart rate changes.  67 yo M with PMHx CVA, HTN, recurrent aspiration, recent admission, deconditioning with PEG tube in place here with AMS. Pt hypotensive, bradycardic on arrival with GCS 7-8. Pt is DNR but would want medical management at this time per wife. NS boluses started with atropine 1 mg x 2, epi 100 mcg (given as 10-20 mcg boluses) and gradual improvement in BP though HR remains 40s. CT head negative. Given +anticoagulation with profound AMS and concern for basilar infarct, CT Angio obtained and is negative. Dr. Quinn Axe of Neuro at bedside as pt was activated as a code stroke. Initial labs show significant metabolic acidosis with acute on chronic kidney injury. Broad-spectrum ABX started, ABX, and will admit to ICU for pressors though should consider transition to comfort care given severity of illness.  FINAL CLINICAL IMPRESSION(S) / ED DIAGNOSES   Final diagnoses:  Disorientation  Bradycardia  Shock ([place])  Acute renal failure with acute renal cortical necrosis superimposed on stage 4 chronic kidney disease ([place])     Rx / DC Orders   ED  Discharge Orders     None        Note:  This document was prepared using Dragon voice recognition software and may include unintentional dictation errors.   Duffy Bruce, MD 08/19/2022 [phone]

## 2022-09-16 NOTE — Progress Notes (Signed)
   10/16/22 1400  Spiritual Encounters  Type of Visit Initial  Care provided to: Family  Referral source Code page  Reason for visit Code  OnCall Visit Yes   Chaplain responded to code stroke. Family present. Chaplain gave compassionate presence, reflective listening and prayed with wife. Chaplain available for follow up as needed.

## 2022-09-16 NOTE — ED Notes (Signed)
This nurse to bedside, introduction made to multiple family members remain at bedside at this time. Encouraged family to reach out if any changes in pt condition, or if any other needs can be met by staff. Pt wife verbalizes understanding. Chaplain remains at bedside with family

## 2022-09-16 NOTE — Consult Note (Signed)
PHARMACY -  BRIEF ANTIBIOTIC NOTE   Pharmacy has received consult(s) for Vancomycin and Cefepime from an ED provider.  The patient's profile has been reviewed for ht/wt/allergies/indication/available labs.    One time order(s) placed for Vancomycin 1.5g IV and Cefepime 2g IV x 1 dose each.  Further antibiotics/pharmacy consults should be ordered by admitting physician if indicated.                       Thank you, Pearla Dubonnet 08/28/2022  2:19 PM

## 2022-09-16 NOTE — Progress Notes (Signed)
   09/06/2022 1600  Spiritual Encounters  Type of Visit Follow up  Care provided to: [facility] partners present during encounter Nurse;Physician  Referral source Family  Reason for visit End-of-life  OnCall Visit Yes   Chaplain responded to follow up visit per patient family request. Acknowledged EOL care per discussion with Nurse and Doctor. Chaplain supported staff and family. Chaplain showed compassionate presence and reflective listening to family. Prayed with family. Chaplains available for follow up as needed.

## 2022-09-16 NOTE — ED Notes (Signed)
Pt. Having shallow breathing with periods of apnea. Family remains at bedside with chaplain. Family discussing going home. Chaplain states she will obtain funeral home information from pt's spouse.

## 2022-09-16 NOTE — ED Notes (Signed)
Pt. Is grabbing at air, pulling at lines and picking at skin. This RN explained to family that agitation can happen at end of life and offered ativan to help him be peaceful. Family agreed.

## 2022-09-16 NOTE — Consult Note (Signed)
NEUROLOGY CONSULTATION NOTE   Date of service: September 16, 2022 Patient Name: Evan FLUEGGE Sr. MRN:  [id number] DOB:  07-06-1956 Reason for consult: stroke code Requesting physician: Dr. Duffy Bruce _ _ _   _ __   _ __ _ _  __ __   _ __   __ _  History of Present Illness   This a 67 year old gentleman with history of hypertension, hyperlipidemia, diabetes, recurrent stroke, baseline left-sided weakness, diastolic dysfunction, CAD, tobacco use, paroxysmal A-fib on Eliquis last dose this morning.  Stroke code was called for decreased responsiveness.  Last known well was 7 AM when patient's wife administered meds through his PEG tube.  He subsequently became increasingly altered and had urinary and bowel incontinence.  Patient went to sleep around 10:00 and was noted to be difficult to arouse and was brought into the ED for further evaluation.  He was bradycardic to the 30s and hypotensive to 70s over 40s.  He was started on pressors and received additional epinephrine during the stroke code.  Patient's stroke scale was 28 and exam was nonfocal.  CT head showed no acute intracranial process.  TNK was not administered due to patient being on Eliquis and also outside the window for thrombolytics.  CTA head and neck was performed to rule out basilar occlusion and it showed no evidence of LVO. CTP was performed with the CTA due to last known well being more than 6 hours ago.  Interpretation was made difficult due to motion artifact but was no deficit noted that would correspond to patient's symptoms.  Patient was started on Vanc, flagyl, and cefepime for probable sepsis.   ROS   UTA  Past History   I have reviewed the following:  Past Medical History:  Diagnosis Date   Acute CVA (cerebrovascular accident) ([place]) 06/05/2021   Acute ischemic stroke ([place]) 04/22/2022   CAP (community acquired pneumonia) 11/26/2021   Carotid arterial disease ([place])    a. 08/2018 Carotid U/S: min-mod RICA  atherosclerosis w/o hemodynamically significant stenosis. Nl LICA.   Diabetes 1.5, managed as type 2 ([place])    Diastolic dysfunction    a. 08/2018 Echo: EF 65%. No rwma. Gr1 DD. Mild MR.   Diastolic dysfunction    a. 08/2019 Echo: EF 55-60%, Gr1 DD. No rwma. Mild MR. RVSP 37.71mmHg.   Hypercholesterolemia    Hypertension    PAF (paroxysmal atrial fibrillation) ([place])    a. 10/2019 Event Monitor: PAF; b. CHA2DS2VASc = 5-->Eliquis.   Poorly controlled diabetes mellitus ([place])    a. 04/2019 A1c 13.8.   Recurrent strokes ([place])    a. 10/2016 MRI/A: Acute 78mm R thalamic infarct, ? subacute infarct of R corona radiata; b. 08/2017 MRI/A: Acute 52mm lateral L thalamic infarct. Other more remote lacunar infarcts of thalami bilat. Small vessel dzs; c. 08/2018 MRI/A: Acute lacunar infarct of the post limb of R internal capsule; d. 08/2019 MRI Acute CVA of L paramedian pons adn R cerebellar hemisphere.   Sepsis due to pneumonia ([place]) 11/26/2021   Tobacco abuse    Past Surgical History:  Procedure Laterality Date   ESOPHAGOGASTRODUODENOSCOPY N/A 08/26/2022   Procedure: ESOPHAGOGASTRODUODENOSCOPY (EGD);  Surgeon: Benjamine Sprague, DO;  Location: [facility] ENDOSCOPY;  Service: General;  Laterality: N/A;   ESOPHAGOGASTRODUODENOSCOPY (EGD) WITH PROPOFOL N/A 04/26/2019   Procedure: ESOPHAGOGASTRODUODENOSCOPY (EGD) WITH PROPOFOL;  Surgeon: Lin Landsman, MD;  Location: [facility] ENDOSCOPY;  Service: Gastroenterology;  Laterality: N/A;   LEFT HEART CATH AND CORONARY ANGIOGRAPHY N/A 01/09/2022  Procedure: LEFT HEART CATH AND CORONARY ANGIOGRAPHY;  Surgeon: Nelva Bush, MD;  Location: [place] CV LAB;  Service: Cardiovascular;  Laterality: N/A;   NO PAST SURGERIES     PEG PLACEMENT N/A 08/26/2022   Procedure: PERCUTANEOUS ENDOSCOPIC GASTROSTOMY (PEG) PLACEMENT;  Surgeon: Benjamine Sprague, DO;  Location: [facility] ENDOSCOPY;  Service: General;  Laterality: N/A;   TEE WITHOUT CARDIOVERSION N/A 04/22/2022   Procedure: TRANSESOPHAGEAL  ECHOCARDIOGRAM (TEE);  Surgeon: Kate Sable, MD;  Location: [facility] ORS;  Service: Cardiovascular;  Laterality: N/A;   Family History  Problem Relation Age of Onset   Hypertension Mother    Stroke Mother        died @ age 52   Hypertension Father    Diabetes Father    Heart attack Father        died @ 73   Social History   Socioeconomic History   Marital status: Married    Spouse name: Presenter, broadcasting   Number of children: 5   Years of education: Not on file   Highest education level: Not on file  Occupational History   Occupation: unemployed  Tobacco Use   Smoking status: Former    Packs/day: 1.00    Years: 38.00    Total pack years: 38.00    Types: Cigarettes    Start date: 1982   Smokeless tobacco: Never  Vaping Use   Vaping Use: Never used  Substance and Sexual Activity   Alcohol use: Not Currently    Alcohol/week: 1.0 standard drink of alcohol    Types: 1 Cans of beer per week    Comment: previously drank but nothing in 1-2 yrs (08/2019).   Drug use: Not Currently    Types: Cocaine, Marijuana    Comment: prev used cocaine/marijuana but none x 1-2 yrs (08/2019).   Sexual activity: Yes  Other Topics Concern   Not on file  Social History Narrative   Lives in [place] with his wife.  Uses a cane when ambulating.  Can walk about 25 yds prior to having to rest - says he stumbles if he has to work further than that.  Usually uses a scooter to get around stores.   Social Determinants of Health   Financial Resource Strain: Low Risk  (04/30/2022)   Overall Financial Resource Strain (CARDIA)    Difficulty of Paying Living Expenses: Not very hard  Food Insecurity: No Food Insecurity (07/27/2022)   Hunger Vital Sign    Worried About Running Out of Food in the Last Year: Never true    Ran Out of Food in the Last Year: Never true  Transportation Needs: No Transportation Needs (07/27/2022)   PRAPARE - Hydrologist (Medical): No    Lack of  Transportation (Non-Medical): No  Physical Activity: Unknown (11/25/2018)   Exercise Vital Sign    Days of Exercise per Week: 0 days    Minutes of Exercise per Session: Not asked  Stress: No Stress Concern Present (11/25/2018)   [place]    Feeling of Stress : Only a little  Social Connections: Moderately Integrated (01/13/2018)   Social Connection and Isolation Panel [NHANES]    Frequency of Communication with Friends and Family: More than three times a week    Frequency of Social Gatherings with Friends and Family: Once a week    Attends Religious Services: More than 4 times per year    Active Member of Clubs or Organizations: No  Attends Banker Meetings: Never    Marital Status: Married   No Known Allergies  Medications   (Not in a hospital admission)     Current Facility-Administered Medications:    atropine 1 MG/10ML injection, , Intravenous, Code/Trauma/Sedation Med, Shaune Pollack, MD, 1 mg at 09-22-2022 1330   ceFEPIme (MAXIPIME) 2 g in sodium chloride 0.9 % 100 mL IVPB, 2 g, Intravenous, Once, Mila Merry A, RPH, Last Rate: 200 mL/hr at 09/22/22 1524, 2 g at September 22, 2022 1524   EPINEPHrine (ADRENALIN) 5 mg in NS 250 mL (0.02 mg/mL) premix infusion, 0.5-20 mcg/min, Intravenous, Titrated, Shaune Pollack, MD, Last Rate: 12 mL/hr at 09-22-22 1504, 4 mcg/min at 09/22/22 1504   EPINEPhrine 10 mcg/mL Adult IV Push Syringe (For Blood Pressure Support), , Intravenous, Code/Trauma/Sedation Med, Shaune Pollack, MD, 30 mcg at 22-Sep-2022 1354   metroNIDAZOLE (FLAGYL) IVPB 500 mg, 500 mg, Intravenous, Once, Shaune Pollack, MD, Last Rate: 100 mL/hr at 09/22/22 1534, 500 mg at 09/22/2022 1534   sodium bicarbonate 150 mEq in sterile water 1,150 mL infusion, , Intravenous, Continuous, Harlon Ditty D, NP   vancomycin (VANCOREADY) IVPB 1500 mg/300 mL, 1,500 mg, Intravenous, Once, Bettey Costa, RPH  Current  Outpatient Medications:    acetaminophen (TYLENOL) 160 MG/5ML solution, Place 20.3 mLs (650 mg total) into feeding tube every 6 (six) hours as needed for mild pain or fever., Disp: 120 mL, Rfl: 2   albuterol (VENTOLIN HFA) 108 (90 Base) MCG/ACT inhaler, Inhale 2 puffs into the lungs every 4 (four) hours as needed for wheezing or shortness of breath., Disp: 18 g, Rfl: 1   amLODipine (NORVASC) 10 MG tablet, Place 1 tablet (10 mg total) into feeding tube daily., Disp: , Rfl:    apixaban (ELIQUIS) 5 MG TABS tablet, Place 1 tablet (5 mg total) into feeding tube 2 (two) times daily., Disp: 60 tablet, Rfl:    atorvastatin (LIPITOR) 80 MG tablet, Place 1 tablet (80 mg total) into feeding tube every evening., Disp: , Rfl:    carvedilol (COREG) 3.125 MG tablet, Place 1 tablet (3.125 mg total) into feeding tube 2 (two) times daily for 90 doses., Disp: 90 tablet, Rfl: 0   iron polysaccharides (NIFEREX) 150 MG capsule, Place 1 capsule (150 mg total) into feeding tube daily., Disp: 90 capsule, Rfl: 1   metoCLOPramide (REGLAN) 5 MG/5ML solution, Take 5 mLs (5 mg total) by mouth 4 (four) times daily -  before meals and at bedtime., Disp: 1800 mL, Rfl: 1   mirtazapine (REMERON) 7.5 MG tablet, Place 1 tablet (7.5 mg total) into feeding tube daily., Disp: , Rfl:    Mouthwashes (MOUTH RINSE) LIQD solution, 15 mLs by Mouth Rinse route in the morning, at noon, in the evening, and at bedtime., Disp: 1800 mL, Rfl: 0   nitroGLYCERIN (NITROSTAT) 0.4 MG SL tablet, Place 1 tablet (0.4 mg total) under the tongue every 5 (five) minutes as needed for chest pain., Disp: 30 tablet, Rfl: 0   Nutritional Supplements (FEEDING SUPPLEMENT, OSMOLITE 1.5 CAL,) LIQD, Place 237 mLs into feeding tube 6 (six) times daily., Disp: 42660 mL, Rfl: 2   omeprazole (KONVOMEP) 2 mg/mL SUSP oral suspension, Place 20 mLs (40 mg total) into feeding tube daily., Disp: 600 mL, Rfl: 1   Protein (FEEDING SUPPLEMENT, PROSOURCE TF20,) liquid, Place 60 mLs into  feeding tube daily., Disp: 1800 mL, Rfl: 0   tamsulosin (FLOMAX) 0.4 MG CAPS capsule, Open capsule and put medication into feeding tube, Disp: 30 capsule, Rfl: 1  Water For Irrigation, Sterile (FREE WATER) SOLN, Place 200 mLs into feeding tube every 4 (four) hours., Disp: , Rfl:   Vitals   Vitals:   09/02/2022 1525 09/11/2022 1530 09/15/2022 1535 08/24/2022 1540  BP: (!) 128/54 (!) 85/47 (!) 103/49 (!) 93/51  Pulse: (!) 41 (!) 41 (!) 39 (!) 40  Resp: 11 11 11 11   SpO2: 100% 100% 99% 100%     There is no height or weight on file to calculate BMI.  Physical Exam   Physical Exam Gen: obtunded, minimally responsive to pain CV: bradycardic and regular Resp: normal WOB  Neuro: *MS: obtunded, minimally responsive to pain, does not follow commands *Speech: severe dysarthria, no intelligible speech *CN: PERRL, (+) corneals and oculocephalics, face symmetric at rest, hearing intact to voice *Motor & sensory: no motor response to noxious stimuli in any extremity, slight grimace to noxious stimuli on R *Reflexes:  1+ and symmetric throughout without clonus; toes mute bilat *Coordination, Gait: UTA  NIHSS  1a Level of Conscious.: 2 1b LOC Questions: 2 1c LOC Commands: 2 2 Best Gaze: 0 3 Visual: 0 4 Facial Palsy: 0 5a Motor Arm - left: 4 5b Motor Arm - Right: 4 6a Motor Leg - Left: 4 6b Motor Leg - Right: 4 7 Limb Ataxia: 0 8 Sensory: 1 9 Best Language: 3 10 Dysarthria: 2 11 Extinct. and Inatten.: 0  TOTAL: 28   Premorbid mRS = 4   Labs   CBC:  Recent Labs  Lab 09/07/2022 1329  WBC 5.6  NEUTROABS 4.5  HGB 8.0*  HCT 25.7*  MCV 84.5  PLT 270    Basic Metabolic Panel:  Lab Results  Component Value Date   NA 125 (L) 08/23/2022   K 6.1 (H) 09/02/2022   CO2 15 (L) 09/02/2022   GLUCOSE 155 (H) 09/14/2022   BUN 85 (H) 09/10/2022   CREATININE 3.89 (H) 09/05/2022   CALCIUM 8.4 (L) 09/08/2022   GFRNONAA 16 (L) 08/31/2022   GFRAA 75 10/03/2020   Lipid Panel:  Lab  Results  Component Value Date   LDLCALC 63 04/22/2022   HgbA1c:  Lab Results  Component Value Date   HGBA1C 6.3 (H) 08/30/2022   Urine Drug Screen:     Component Value Date/Time   LABOPIA NONE DETECTED 12/28/2021 1449   COCAINSCRNUR NONE DETECTED 12/28/2021 1449   LABBENZ NONE DETECTED 12/28/2021 1449   AMPHETMU NONE DETECTED 12/28/2021 1449   THCU NONE DETECTED 12/28/2021 1449   LABBARB NONE DETECTED 12/28/2021 1449    Alcohol Level     Component Value Date/Time   ETH <10 09/15/2022 1329     Impression   This a 67 year old gentleman with history of hypertension, hyperlipidemia, diabetes, recurrent stroke, baseline left-sided weakness, diastolic dysfunction, CAD, tobacco use, paroxysmal A-fib on Eliquis last dose this morning.  Stroke code was called for decreased responsiveness in the setting of severe hypotension and bradycardia. Exam was nonfocal and CTH and CTA showed no ICH or LVO.  I overall suspicion for stroke as the etiology of his symptoms is low therefore recommend continuation of Eliquis unless there is another contraindication.  I suspect that his symptoms are either due to primary cardiac etiology or to septic shock.  He is currently on vancomycin, Flagyl, and cefepime for probable sepsis.  We will maintain him on every 2 hour NIH stroke scale and vital signs due to the possibility that he had a watershed infarcts during the event.  He should get an MRI brain  when he is stable to do so.  In the meantime again because suspicion for stroke is low he may be continued on Eliquis.  Recommendations   - q 2 hr NIHSS and vitals - MRI brain wo contrast when stable to undergo - OK to continue eliquis unless other contraindication - Neurology will continue to follow  This patient is critically ill and at significant risk of neurological worsening, death and care requires constant monitoring of vital signs, hemodynamics,respiratory and cardiac monitoring, neurological  assessment, discussion with family, other specialists and medical decision making of high complexity. I spent 70 minutes of neurocritical care time  in the care of  this patient. This was time spent independent of any time provided by nurse practitioner or PA.  Bing Neighbors, MD [facility] [phone]  If 7pm- 7am, please page neurology on call as listed in AMION.

## 2022-09-16 NOTE — Consult Note (Signed)
NAME:  Evan Puebla., MRN:  [id number], DOB:  1956-02-20, LOS: 0 ADMISSION DATE:  10-03-2022   BRIEF SYNOPSIS  History of Present Illness:  67 year old gentleman with history of hypertension, hyperlipidemia, diabetes, recurrent stroke, baseline left-sided weakness, diastolic dysfunction, CAD, tobacco use, paroxysmal A-fib on Eliquis last dose this morning.    Stroke code was called for decreased responsiveness.   Last known well was 7 AM when patient's wife administered meds through his PEG tube.  He subsequently became increasingly altered and had urinary and bowel incontinence.    Patient went to sleep around 10:00 and was noted to be difficult to arouse and was brought into the ED for further evaluation.    He was bradycardic to the 30s and hypotensive to 70s over 40s.  He was started on pressors and received additional epinephrine during the stroke code.  Patient's stroke scale was 28 and exam was nonfocal.    CT head showed no acute intracranial process.  TNK was not administered due to patient being on Eliquis and also outside the window for thrombolytics.  CTA head and neck was performed to rule out basilar occlusion and it showed no evidence of LVO.   CTP was performed with the CTA due to last known well being more than 6 hours ago.  Interpretation was made difficult due to motion artifact but was no deficit noted that would correspond to patient's symptoms.  Patient was started on Vanc, flagyl, and cefepime for probable sepsis.   At this time, GCS 3 unreasoning to vocal and painful stimuli  ER course Patient is DNR/DNI status confirmed by Wife at Bedside Patient on EPI INFUSION HR remains 30's 100% NRB MASK  PATIENT IS OBTUNDED LABS c/w ARF with severe HYPERKALEMIA  WE ALSO CONFIRMED FROM WIFE THAT PATIENT WOULD NOT WANT HEMODIALYSIS  AT THIS TIME, IT IS DEEMED THAT PATIENT IS IN MULTIORGAN FAILURE AND IN THE DYING PROCESS  AT THIS TIME, PATIENT IS SUFFERING AND  SUFFOCATING WITH SIGNS OF ASPIRATION WE HAVE ASKED ALL FAMILY MEMBERS TO [facility] Events: Including procedures, antibiotic start and stop dates in addition to other pertinent events   October 03, 2022 PCCM asked to admit patient, patient is suffering and dying process      Antimicrobials:   Antibiotics Given (last 72 hours)     Date/Time Action Medication Dose Rate   2022-10-03 1524 New Bag/Given   ceFEPIme (MAXIPIME) 2 g in sodium chloride 0.9 % 100 mL IVPB 2 g 200 mL/hr   03-Oct-2022 1534 New Bag/Given   metroNIDAZOLE (FLAGYL) IVPB 500 mg 500 mg 100 mL/hr              Objective   Blood pressure (!) 93/51, pulse (!) 40, resp. rate 11, SpO2 100 %.        Intake/Output Summary (Last 24 hours) at Oct 03, 2022 1606 Last data filed at 10-03-2022 1528 Gross per 24 hour  Intake 2050 ml  Output --  Net 2050 ml   There were no vitals filed for this visit.   REVIEW OF SYSTEMS  PATIENT IS UNABLE TO PROVIDE COMPLETE REVIEW OF SYSTEMS DUE TO SEVERE CRITICAL ILLNESS   PHYSICAL EXAMINATION:  GENERAL:critically ill appearing, +resp distress EYES: Pupils equal, round, reactive to light.  No scleral icterus.  MOUTH: Moist mucosal membrane.  NECK: Supple.  PULMONARY: Lungs clear to auscultation, +rhonchi, +wheezing CARDIOVASCULAR: S1 and S2.  Regular rate and rhythm GASTROINTESTINAL: Soft, nontender, -distended. Positive bowel sounds.  MUSCULOSKELETAL: No swelling, clubbing, or edema.  NEUROLOGIC: obtunded GCS 3 SKIN:normal, warm to touch, Capillary refill delayed  Pulses present bilaterally   Labs/imaging that I havepersonally reviewed  (right click and "Reselect all SmartList Selections" daily)       ASSESSMENT AND PLAN SYNOPSIS  Severe ACUTE Hypoxic and Hypercapnic Respiratory Failure and CARDIAC FAILURE DUE TO PROGRESSIVE RENAL FAILURE AND ACIDOSIS WITH A CUTE ASPIRATION PNEUMONIA WITH PROBABLE ACUTE SEIZURES IN THE SETTING OF MULTIORGAN  FAILURE  AFTER FURTHER ASSESSMENT, PATIENT IS IN SEPSIS/CARDIOGENIC SHOCK AND IN THE DYING PROCESS  RECOMMEND COMFORT CARE MEASURES AT THIS TIME FAMILY URGED TO COME TO BEDSIDE< SEVERAL MEMBERS ON THEIR WAY AND FAMILY WILL PURSUE WITHDRAWAL OF CARE  Morphine as needed Ativan as needed     Best practice (right click and "Reselect all SmartList Selections" daily)  Code Status:  DNR/DNI, no HEMODIALYSIS Disposition:COMFORT CARE MEASURES  Labs   CBC: Recent Labs  Lab 08/30/2022 1329  WBC 5.6  NEUTROABS 4.5  HGB 8.0*  HCT 25.7*  MCV 84.5  PLT 376    Basic Metabolic Panel: Recent Labs  Lab 09/05/2022 1329  NA 125*  K 6.1*  CL 99  CO2 15*  GLUCOSE 155*  BUN 85*  CREATININE 3.89*  CALCIUM 8.4*   GFR: CrCl cannot be calculated (Unknown ideal weight.). Recent Labs  Lab 09/12/2022 1329 09/02/2022 1330  WBC 5.6  --   LATICACIDVEN  --  1.4    Liver Function Tests: Recent Labs  Lab 08/19/2022 1329  AST 57*  ALT 71*  ALKPHOS 164*  BILITOT 0.6  PROT 7.4  ALBUMIN 2.7*   No results for input(s): "LIPASE", "AMYLASE" in the last 168 hours. No results for input(s): "AMMONIA" in the last 168 hours.  ABG    Component Value Date/Time   PHART 7.23 (L) 09/04/2022 1426   PCO2ART 29 (L) 08/31/2022 1426   PO2ART 92 08/26/2022 1426   HCO3 12.1 (L) 08/22/2022 1426   ACIDBASEDEF 14.0 (H) 09/07/2022 1426   O2SAT 98.2 08/19/2022 1426     Coagulation Profile: No results for input(s): "INR", "PROTIME" in the last 168 hours.  Cardiac Enzymes: No results for input(s): "CKTOTAL", "CKMB", "CKMBINDEX", "TROPONINI" in the last 168 hours.  HbA1C: HB A1C (BAYER DCA - WAIVED)  Date/Time Value Ref Range Status  01/01/2022 03:01 PM 6.1 (H) 4.8 - 5.6 % Final    Comment:             Prediabetes: 5.7 - 6.4          Diabetes: >6.4          Glycemic control for adults with diabetes: <7.0   11/12/2021 02:59 PM 6.2 (H) 4.8 - 5.6 % Final    Comment:             Prediabetes: 5.7 - 6.4           Diabetes: >6.4          Glycemic control for adults with diabetes: <7.0    Hgb A1c MFr Bld  Date/Time Value Ref Range Status  08/30/2022 05:22 AM 6.3 (H) 4.8 - 5.6 % Final    Comment:    (NOTE) Pre diabetes:          5.7%-6.4%  Diabetes:              >6.4%  Glycemic control for   <7.0% adults with diabetes   06/23/2022 03:47 AM 6.8 (H) 4.8 - 5.6 % Final    Comment:    (  NOTE) Pre diabetes:          5.7%-6.4%  Diabetes:              >6.4%  Glycemic control for   <7.0% adults with diabetes     CBG: Recent Labs  Lab Oct 16, 2022 1332 16-Oct-2022 1537  GLUCAP 162* 238*     Past Medical History:  He,  has a past medical history of Acute CVA (cerebrovascular accident) (HCC) (06/05/2021), Acute ischemic stroke (HCC) (04/22/2022), CAP (community acquired pneumonia) (11/26/2021), Carotid arterial disease (HCC), Diabetes 1.5, managed as type 2 (HCC), Diastolic dysfunction, Diastolic dysfunction, Hypercholesterolemia, Hypertension, PAF (paroxysmal atrial fibrillation) (HCC), Poorly controlled diabetes mellitus (HCC), Recurrent strokes (HCC), Sepsis due to pneumonia (HCC) (11/26/2021), and Tobacco abuse.   Surgical History:   Past Surgical History:  Procedure Laterality Date   ESOPHAGOGASTRODUODENOSCOPY N/A 08/26/2022   Procedure: ESOPHAGOGASTRODUODENOSCOPY (EGD);  Surgeon: Sung Amabile, DO;  Location: [facility] ENDOSCOPY;  Service: General;  Laterality: N/A;   ESOPHAGOGASTRODUODENOSCOPY (EGD) WITH PROPOFOL N/A 04/26/2019   Procedure: ESOPHAGOGASTRODUODENOSCOPY (EGD) WITH PROPOFOL;  Surgeon: Toney Reil, MD;  Location: [facility] ENDOSCOPY;  Service: Gastroenterology;  Laterality: N/A;   LEFT HEART CATH AND CORONARY ANGIOGRAPHY N/A 01/09/2022   Procedure: LEFT HEART CATH AND CORONARY ANGIOGRAPHY;  Surgeon: [name], MD;  Location: [facility] INVASIVE CV LAB;  Service: Cardiovascular;  Laterality: N/A;   NO PAST SURGERIES     PEG PLACEMENT N/A 08/26/2022   Procedure: PERCUTANEOUS  ENDOSCOPIC GASTROSTOMY (PEG) PLACEMENT;  Surgeon: Sung Amabile, DO;  Location: [facility] ENDOSCOPY;  Service: General;  Laterality: N/A;   TEE WITHOUT CARDIOVERSION N/A 04/22/2022   Procedure: TRANSESOPHAGEAL ECHOCARDIOGRAM (TEE);  Surgeon: Debbe Odea, MD;  Location: [facility] ORS;  Service: Cardiovascular;  Laterality: N/A;     Social History:   reports that he has quit smoking. His smoking use included cigarettes. He started smoking about 42 years ago. He has a 38.00 pack-year smoking history. He has never used smokeless tobacco. He reports that he does not currently use alcohol after a past usage of about 1.0 standard drink of alcohol per week. He reports that he does not currently use drugs after having used the following drugs: Cocaine and Marijuana.   Family History:  His family history includes Diabetes in his father; Heart attack in his father; Hypertension in his father and mother; Stroke in his mother.   Allergies No Known Allergies   Home Medications  Prior to Admission medications   Medication Sig Start Date End Date Taking? Authorizing Provider  acetaminophen (TYLENOL) 160 MG/5ML solution Place 20.3 mLs (650 mg total) into feeding tube every 6 (six) hours as needed for mild pain or fever. 08/31/22   Almon Hercules, MD  albuterol (VENTOLIN HFA) 108 (90 Base) MCG/ACT inhaler Inhale 2 puffs into the lungs every 4 (four) hours as needed for wheezing or shortness of breath. 07/17/22   Larae Grooms, NP  amLODipine (NORVASC) 10 MG tablet Place 1 tablet (10 mg total) into feeding tube daily. 08/28/22   Amin, Loura Halt, MD  apixaban (ELIQUIS) 5 MG TABS tablet Place 1 tablet (5 mg total) into feeding tube 2 (two) times daily. 08/28/22   Amin, Loura Halt, MD  atorvastatin (LIPITOR) 80 MG tablet Place 1 tablet (80 mg total) into feeding tube every evening. 08/28/22   Amin, Loura Halt, MD  carvedilol (COREG) 3.125 MG tablet Place 1 tablet (3.125 mg total) into feeding tube 2 (two) times daily  for 90 doses. 08/31/22 10/15/22  Almon Hercules, MD  iron polysaccharides (NIFEREX) 150 MG capsule Place 1 capsule (150 mg total) into feeding tube daily. 08/28/22   Amin, Jeanella Flattery, MD  metoCLOPramide (REGLAN) 5 MG/5ML solution Take 5 mLs (5 mg total) by mouth 4 (four) times daily -  before meals and at bedtime. 20-Sep-2022 03/15/23  Jon Billings, NP  mirtazapine (REMERON) 7.5 MG tablet Place 1 tablet (7.5 mg total) into feeding tube daily. 08/28/22   Amin, Jeanella Flattery, MD  Mouthwashes (MOUTH RINSE) LIQD solution 15 mLs by Mouth Rinse route in the morning, at noon, in the evening, and at bedtime. 08/31/22 09/30/22  Mercy Riding, MD  nitroGLYCERIN (NITROSTAT) 0.4 MG SL tablet Place 1 tablet (0.4 mg total) under the tongue every 5 (five) minutes as needed for chest pain. 07/02/22   Jon Billings, NP  Nutritional Supplements (FEEDING SUPPLEMENT, OSMOLITE 1.5 CAL,) LIQD Place 237 mLs into feeding tube 6 (six) times daily. 08/31/22 11/29/22  Mercy Riding, MD  omeprazole (KONVOMEP) 2 mg/mL SUSP oral suspension Place 20 mLs (40 mg total) into feeding tube daily. 08/31/22 10/30/22  Mercy Riding, MD  Protein (FEEDING SUPPLEMENT, PROSOURCE SK87,) liquid Place 60 mLs into feeding tube daily. 08/31/22 09/30/22  Mercy Riding, MD  tamsulosin (FLOMAX) 0.4 MG CAPS capsule Open capsule and put medication into feeding tube 08/31/22   Mercy Riding, MD  Water For Irrigation, Sterile (FREE WATER) SOLN Place 200 mLs into feeding tube every 4 (four) hours. 08/29/22   Damita Lack, MD         Critical Care Time devoted to patient care services described in this note is 75 minutes.   Critical care was necessary to treat /prevent imminent and life-threatening deterioration.   PATIENT WITH VERY POOR/GRAVE  PROGNOSIS  Patient is critically ill. Patient with Multiorgan failure and at high risk for cardiac arrest and death.    Corrin Parker, M.D.  [facility]  Medical Director  [place] Director [facility] Cardio-Pulmonary Department

## 2022-09-16 NOTE — Code Documentation (Signed)
Dr. Quinn Axe to bedside, speaking with family.

## 2022-09-16 NOTE — ED Notes (Signed)
Dr. Mortimer Fries and Darlyn Chamber, PA at bedside

## 2022-09-16 NOTE — H&P (Signed)
History and Physical    Chief Complaint: Mental status change   HISTORY OF PRESENT ILLNESS: Evan GRAVITT Sr. is an 67 y.o. male brought to the ED in a private vehicle for altered mental status patient at that time was minimally responsive his last known well was 7.  Family reported that patient was incontinent with stools and urine.  Patient was described as restless.  On initial arrival when patient came to the emergency room a code stroke was called.  Patient had gone to bed around 10 and since then was noted to be minimally responsive..  Patient bilateral arm and leg weakness decree sensation aphasia on exam and is out of tPA window and patient is on Eliquis on initial general code stroke assessment.  Patient was seen by neurologist in the emergency room.  Code sepsis was called and patient was started on Vanco Flagyl and cefepime for probable suspected sepsis.  ICU MD Dr. Callie Fielding saw the patient and discussed prognosis with the family and decision to withdraw care was made by family members after discussion with ICU MD and terminal Prognosis.  Patient was found to be bradycardic hypotensive.  Negative head CT. Imaging of the head full code stroke was negative.  Vasopressor therapy was started due to hypotension.  Patient was started on epinephrine however will remain bradycardic.  Patient was in multiorgan failure and prognosis is terminal.  Family has been communicating with the nurse for Making patient comfortable. Wife and family have decided to make pt comfort care.  Pt noted to have shallow respirations and periods of apnea. Wife at bedside and lets me examine him. Pt is unresponsive protecting his airway no distress. RR is in tens./   Pt has  Past Medical History:  Diagnosis Date   Acute CVA (cerebrovascular accident) ([place]) 06/05/2021   Acute ischemic stroke ([place]) 04/22/2022   CAP (community acquired pneumonia) 11/26/2021   Carotid arterial disease ([place])    a. 08/2018 Carotid U/S:  min-mod RICA atherosclerosis w/o hemodynamically significant stenosis. Nl LICA.   Diabetes 1.5, managed as type 2 ([place])    Diastolic dysfunction    a. 08/2018 Echo: EF 65%. No rwma. Gr1 DD. Mild MR.   Diastolic dysfunction    a. 08/2019 Echo: EF 55-60%, Gr1 DD. No rwma. Mild MR. RVSP 37.73mmHg.   Hypercholesterolemia    Hypertension    PAF (paroxysmal atrial fibrillation) ([place])    a. 10/2019 Event Monitor: PAF; b. CHA2DS2VASc = 5-->Eliquis.   Poorly controlled diabetes mellitus ([place])    a. 04/2019 A1c 13.8.   Recurrent strokes ([place])    a. 10/2016 MRI/A: Acute 56mm R thalamic infarct, ? subacute infarct of R corona radiata; b. 08/2017 MRI/A: Acute 65mm lateral L thalamic infarct. Other more remote lacunar infarcts of thalami bilat. Small vessel dzs; c. 08/2018 MRI/A: Acute lacunar infarct of the post limb of R internal capsule; d. 08/2019 MRI Acute CVA of L paramedian pons adn R cerebellar hemisphere.   Sepsis due to pneumonia ([place]) 11/26/2021   Tobacco abuse    Review of Systems  Unable to perform ROS: Patient nonverbal   No Known Allergies Past Surgical History:  Procedure Laterality Date   ESOPHAGOGASTRODUODENOSCOPY N/A 08/26/2022   Procedure: ESOPHAGOGASTRODUODENOSCOPY (EGD);  Surgeon: Benjamine Sprague, DO;  Location: [facility] ENDOSCOPY;  Service: General;  Laterality: N/A;   ESOPHAGOGASTRODUODENOSCOPY (EGD) WITH PROPOFOL N/A 04/26/2019   Procedure: ESOPHAGOGASTRODUODENOSCOPY (EGD) WITH PROPOFOL;  Surgeon: Lin Landsman, MD;  Location: [facility] ENDOSCOPY;  Service: Gastroenterology;  Laterality: N/A;  LEFT HEART CATH AND CORONARY ANGIOGRAPHY N/A 01/09/2022   Procedure: LEFT HEART CATH AND CORONARY ANGIOGRAPHY;  Surgeon: Nelva Bush, MD;  Location: [facility];  Service: Cardiovascular;  Laterality: N/A;   NO PAST SURGERIES     PEG PLACEMENT N/A 08/26/2022   Procedure: PERCUTANEOUS ENDOSCOPIC GASTROSTOMY (PEG) PLACEMENT;  Surgeon: Benjamine Sprague, DO;  Location: [facility] ENDOSCOPY;  Service:  General;  Laterality: N/A;   TEE WITHOUT CARDIOVERSION N/A 04/22/2022   Procedure: TRANSESOPHAGEAL ECHOCARDIOGRAM (TEE);  Surgeon: Kate Sable, MD;  Location: [facility] ORS;  Service: Cardiovascular;  Laterality: N/A;       MEDICATIONS: Current Outpatient Medications  Medication Instructions   acetaminophen (TYLENOL) 650 mg, Per Tube, Every 6 hours PRN   albuterol (VENTOLIN HFA) 108 (90 Base) MCG/ACT inhaler 2 puffs, Inhalation, Every 4 hours PRN   amLODipine (NORVASC) 10 mg, Per Tube, Daily   apixaban (ELIQUIS) 5 mg, Per Tube, 2 times daily   atorvastatin (LIPITOR) 80 mg, Per Tube, Every evening   carvedilol (COREG) 3.125 mg, Per Tube, 2 times daily   iron polysaccharides (NIFEREX) 150 mg, Per Tube, Daily   metoCLOPramide (REGLAN) 5 mg, Oral, 3 times daily before meals & bedtime   mirtazapine (REMERON) 7.5 mg, Per Tube, Daily   Mouthwashes (MOUTH RINSE) LIQD solution 15 mLs, Mouth Rinse, 4 times daily   nitroGLYCERIN (NITROSTAT) 0.4 mg, Sublingual, Every 5 min PRN   Nutritional Supplements (FEEDING SUPPLEMENT, OSMOLITE 1.5 CAL,) LIQD 237 mLs, Per Tube, 6 times daily   omeprazole (KONVOMEP) 40 mg, Per Tube, Daily   Protein (FEEDING SUPPLEMENT, PROSOURCE TF20,) liquid 60 mLs, Per Tube, Daily   tamsulosin (FLOMAX) 0.4 MG CAPS capsule Open capsule and put medication into feeding tube   Water For Irrigation, Sterile (FREE WATER) SOLN 200 mLs, Per Tube, Every 4 hours       sodium chloride     epinephrine Stopped (09/14/2022 1744)   vancomycin      acetaminophen **OR** acetaminophen, atropine, EPINEPHrine, glycopyrrolate **OR** glycopyrrolate **OR** glycopyrrolate, LORazepam, morphine injection, polyvinyl alcohol    ED Course: Pt in Ed pt is unresponsive.  Vitals:   08/24/2022 1800 09/11/2022 1815 09/02/2022 2045 10-07-2022 0030  BP:      Pulse: (!) 46 (!) 38 (!) 35 (!) 33  Resp:      SpO2: 90% (!) 84% 95% (!) 88%   No intake/output data recorded. SpO2: (!) 88 % O2 Flow Rate (L/min): 10  L/min Blood work in ed shows: Metabolic acidosis with a pH of 7.23.  Bicarb of 12.1. CMP shows hyponatremia hypokalemia acute kidney injury abnormal LFTs ALT indicating towards multisystem failure.  CBC shows chronic anemia white count of 5.6 platelets of 311.  Respiratory panel was negative for flu and COVID and RSV.  Urinalysis is turbid with nitrite negative urine with and 11-30 RBCs and 11-30 WBCs.   Results for orders placed or performed during the hospital encounter of 08/18/2022 (from the past 24 hour(s))  CBC with Differential     Status: Abnormal   Collection Time: 08/31/2022  1:29 PM  Result Value Ref Range   WBC 5.6 4.0 - 10.5 K/uL   RBC 3.04 (L) 4.22 - 5.81 MIL/uL   Hemoglobin 8.0 (L) 13.0 - 17.0 g/dL   HCT 25.7 (L) 39.0 - 52.0 %   MCV 84.5 80.0 - 100.0 fL   MCH 26.3 26.0 - 34.0 pg   MCHC 31.1 30.0 - 36.0 g/dL   RDW 18.6 (H) 11.5 - 15.5 %  Platelets 311 150 - 400 K/uL   nRBC 2.1 (H) 0.0 - 0.2 %   Neutrophils Relative % 79 %   Neutro Abs 4.5 1.7 - 7.7 K/uL   Lymphocytes Relative 12 %   Lymphs Abs 0.7 0.7 - 4.0 K/uL   Monocytes Relative 6 %   Monocytes Absolute 0.4 0.1 - 1.0 K/uL   Eosinophils Relative 1 %   Eosinophils Absolute 0.0 0.0 - 0.5 K/uL   Basophils Relative 1 %   Basophils Absolute 0.0 0.0 - 0.1 K/uL   WBC Morphology INCREASED BANDS (>20% BANDS)    RBC Morphology MORPHOLOGY UNREMARKABLE    Smear Review Normal platelet morphology    Immature Granulocytes 1 %   Abs Immature Granulocytes 0.05 0.00 - 0.07 K/uL  Comprehensive metabolic panel     Status: Abnormal   Collection Time: 09/07/2022  1:29 PM  Result Value Ref Range   Sodium 125 (L) 135 - 145 mmol/L   Potassium 6.1 (H) 3.5 - 5.1 mmol/L   Chloride 99 98 - 111 mmol/L   CO2 15 (L) 22 - 32 mmol/L   Glucose, Bld 155 (H) 70 - 99 mg/dL   BUN 85 (H) 8 - 23 mg/dL   Creatinine, Ser 3.89 (H) 0.61 - 1.24 mg/dL   Calcium 8.4 (L) 8.9 - 10.3 mg/dL   Total Protein 7.4 6.5 - 8.1 g/dL   Albumin 2.7 (L) 3.5 - 5.0 g/dL    AST 57 (H) 15 - 41 U/L   ALT 71 (H) 0 - 44 U/L   Alkaline Phosphatase 164 (H) 38 - 126 U/L   Total Bilirubin 0.6 0.3 - 1.2 mg/dL   GFR, Estimated 16 (L) >60 mL/min   Anion gap 11 5 - 15  Troponin I (High Sensitivity)     Status: None   Collection Time: 08/19/2022  1:29 PM  Result Value Ref Range   Troponin I (High Sensitivity) 5 <18 ng/L  Ethanol     Status: None   Collection Time: 09/15/2022  1:29 PM  Result Value Ref Range   Alcohol, Ethyl (B) <10 <10 mg/dL  Lactic acid, plasma     Status: None   Collection Time: 09/06/2022  1:30 PM  Result Value Ref Range   Lactic Acid, Venous 1.4 0.5 - 1.9 mmol/L  CBG monitoring, ED     Status: Abnormal   Collection Time: 09/09/2022  1:32 PM  Result Value Ref Range   Glucose-Capillary 162 (H) 70 - 99 mg/dL  Blood gas, arterial     Status: Abnormal (Preliminary result)   Collection Time: 09/02/2022  2:26 PM  Result Value Ref Range   O2 Content 15.0 L/min   Delivery systems NON-REBREATHER OXYGEN MASK    pH, Arterial 7.23 (L) 7.35 - 7.45   pCO2 arterial 29 (L) 32 - 48 mmHg   pO2, Arterial 92 83 - 108 mmHg   Bicarbonate 12.1 (L) 20.0 - 28.0 mmol/L   Acid-base deficit 14.0 (H) 0.0 - 2.0 mmol/L   O2 Saturation 98.2 %   Patient temperature 37.0    Collection site RIGHT RADIAL    Drawn by [id number]    Allens test (pass/fail) PENDING PASS  CBG monitoring, ED     Status: Abnormal   Collection Time: 09/15/2022  3:37 PM  Result Value Ref Range   Glucose-Capillary 238 (H) 70 - 99 mg/dL   *Note: Due to a large number of results and/or encounters for the requested time period, some results have not been displayed. A  complete set of results can be found in Results Review.    Unresulted Labs (From admission, onward)     Start     Ordered   08/17/2022 [id number]  Basic metabolic panel  Once,   STAT        08/21/2022 1532   08/25/2022 1533  Culture, blood (Routine X 2) w Reflex to ID Panel  BLOOD CULTURE X 2,   STAT      09/05/2022 1532   09/09/2022 1532  Urinalysis,  Complete w Microscopic -Urine, Clean Catch  ONCE - URGENT,   URGENT       Question:  Specimen Source  Answer:  Urine, Clean Catch   08/26/2022 1531   08/26/2022 1511  Resp panel by RT-PCR (RSV, Flu A&B, Covid) Anterior Nasal Swab  (Tier 2 - SymptomaticResp panel by RT-PCR (RSV, Flu A&B, Covid))  Once,   URGENT        09/01/2022 1510   09/15/2022 1347  Lactic acid, plasma  Now then every 2 hours,   STAT      09/01/2022 1347   09/12/2022 1347  Urine Drug Screen, Qualitative  Once,   URGENT        09/14/2022 1347           Pt has received : Orders Placed This Encounter  Procedures   Critical Care    This order was created via procedure documentation    Standing Status:   Standing    Number of Occurrences:   1   Resp panel by RT-PCR (RSV, Flu A&B, Covid) Anterior Nasal Swab    Standing Status:   Standing    Number of Occurrences:   1   Culture, blood (Routine X 2) w Reflex to ID Panel    Standing Status:   Standing    Number of Occurrences:   2   DG Chest Portable 1 View    Standing Status:   Standing    Number of Occurrences:   1    Order Specific Question:   Reason for Exam (SYMPTOM  OR DIAGNOSIS REQUIRED)    Answer:   SOB   CT HEAD CODE STROKE WO CONTRAST    CT Head Code Stroke - for patients within the Code Stroke Window    Standing Status:   Standing    Number of Occurrences:   1    Order Specific Question:   Radiology Contrast Protocol - do NOT remove file path    Answer:   [email or web address]   CT ANGIO HEAD NECK W WO CM W PERF (CODE STROKE)    Standing Status:   Standing    Number of Occurrences:   1    Order Specific Question:   Does the patient have a contrast media/X-ray dye allergy?    Answer:   No    Order Specific Question:   If indicated for the ordered procedure, I authorize the administration of contrast media per Radiology protocol    Answer:   Yes   CBC with Differential    Standing Status:   Standing    Number of Occurrences:   1    Comprehensive metabolic panel    Standing Status:   Standing    Number of Occurrences:   1   Lactic acid, plasma    Standing Status:   Standing    Number of Occurrences:   2   Ethanol    Standing Status:   Standing    Number of Occurrences:  1   Urine Drug Screen, Qualitative    Standing Status:   Standing    Number of Occurrences:   1   Blood gas, arterial    Standing Status:   Standing    Number of Occurrences:   1   Urinalysis, Complete w Microscopic -Urine, Clean Catch    Standing Status:   Standing    Number of Occurrences:   1    Order Specific Question:   Specimen Source    Answer:   Urine, Clean Catch [16]   Basic metabolic panel    Standing Status:   Standing    Number of Occurrences:   1   Diet NPO time specified    NPO until stroke swallow screen is complete    Standing Status:   Standing    Number of Occurrences:   1   Vital signs    Standing Status:   Standing    Number of Occurrences:   1   Cardiac Monitoring    Standing Status:   Standing    Number of Occurrences:   1   NIH stroke score    On arrival and q 2h x 12h, then q 4h    Standing Status:   Standing    Number of Occurrences:   1   Swallow screen    Standing Status:   Standing    Number of Occurrences:   1   Activate teleneurology    Call [facility] and activate tele-neurology    Standing Status:   Standing    Number of Occurrences:   1   Initiate Carrier Fluid Protocol    Standing Status:   Standing    Number of Occurrences:   1   If O2 sat If O2 Sat < 94%, administer O2 at 2 liters/minute via nasal cannula.    If O2 Sat < 94%, administer O2 at 2 liters/minute via nasal cannula.    Standing Status:   Standing    Number of Occurrences:   1   RN may pronounce death    Standing Status:   Standing    Number of Occurrences:   1   Care order/instruction: PLAN TO TRANSITION TO COMFORT CARE MEASURES ONCE ALL FAMILY IS AT BEDSIDE WILL CONTINUE CURRENT MEASURES BUT WILL NOT ESCALATE CARE Do NOT  titrate EPINEPHRINE infusion    PLAN TO TRANSITION TO COMFORT CARE MEASURES ONCE ALL FAMILY IS AT BEDSIDE WILL CONTINUE CURRENT MEASURES BUT WILL NOT ESCALATE CARE Do NOT titrate EPINEPHRINE infusion    Standing Status:   Standing    Number of Occurrences:   1   Before discontinuing vasopressors, ensure that the patient has been extubated    Standing Status:   Standing    Number of Occurrences:   1   Wean pressors once extubated: Decrease vasopressors to half (1/2) the current rate over 10 minutes, then wean to off over 10 minutes    Standing Status:   Standing    Number of Occurrences:   1   Discontinue all previous orders, including vital signs, medications, enteral feeding, intravenous drips, blood sugar monitoring, radiographs, and laboratory tests. If TPN is running, discontinue the TPN after current bag and hang D5W at [facility]    Standing Status:   Standing    Number of Occurrences:   1   Discontinue restraints    Standing Status:   Standing    Number of Occurrences:   1   Remove devices not necessary  for comfort, including monitors, hemodynamic lines (except ok to leave central line and foley), blood pressure cuffs, and leg compression sleeve(s)    Standing Status:   Standing    Number of Occurrences:   1   If patient has implantable cardiac defibrillator (ICD), obtain magnet and place tape over ICD to disable device    Contact product vendor    Standing Status:   Standing    Number of Occurrences:   1   RNs (two) may pronounce death    Standing Status:   Standing    Number of Occurrences:   1   If not in ICU, remove patient from central telemetry monitoring    Standing Status:   Standing    Number of Occurrences:   1   Do not attempt resuscitation (DNR)    Standing Status:   Standing    Number of Occurrences:   1    Order Specific Question:   If patient has no pulse and is not breathing    Answer:   Do Not Attempt Resuscitation    Order Specific Question:   If patient has a  pulse and/or is breathing: Medical Treatment Goals    Answer:   COMFORT MEASURES: Keep clean/warm/dry, use medication by any route; positioning, wound care and other measures to relieve pain/suffering; use oxygen, suction/manual treatment of airway obstruction for comfort; do not transfer unless for comfort needs.    Order Specific Question:   Consent:    Answer:   Discussion documented in EHR or advanced directives reviewed   Janeece Riggers to Activate Code Stroke    Call [facility] [phone]    Standing Status:   Standing    Number of Occurrences:   1    Order Specific Question:   Reason for Consult?    Answer:   Code Stroke   Consult to hospitalist    Standing Status:   Standing    Number of Occurrences:   1    Order Specific Question:   Place call to:    Answer:   [id number]    Order Specific Question:   Reason for Consult    Answer:   Admit   Airborne and Contact precautions    Standing Status:   Standing    Number of Occurrences:   1   Pulse oximetry, continuous    Standing Status:   Standing    Number of Occurrences:   1   CBG monitoring, ED    Standing Status:   Standing    Number of Occurrences:   1   CBG monitoring, ED    Standing Status:   Standing    Number of Occurrences:   1   EKG 12-Lead    Standing Status:   Standing    Number of Occurrences:   1   ED EKG    Standing Status:   Standing    Number of Occurrences:   1    Order Specific Question:   Reason for Exam    Answer:   Chest Pain   ED EKG    Stroke Symptoms    Standing Status:   Standing    Number of Occurrences:   1    Order Specific Question:   Reason for Exam    Answer:   Other (See Comments)   Saline lock IV    Standing Status:   Standing    Number of Occurrences:   1   Admit to Inpatient (patient's expected length  of stay will be greater than 2 midnights or inpatient only procedure)    Standing Status:   Standing    Number of Occurrences:   1    Order Specific Question:   Hospital Area    Answer:    [place] [[id number]]    Order Specific Question:   Level of Care    Answer:   Med-Surg [16]    Order Specific Question:   Covid Evaluation    Answer:   Asymptomatic - no recent exposure (last 10 days) testing not required    Order Specific Question:   Diagnosis    Answer:   AMS (altered mental status) [id number]    Order Specific Question:   Admitting Physician    Answer:   Cherylann Ratel    Order Specific Question:   Attending Physician    Answer:   Cherylann Ratel    Order Specific Question:   Certification:    Answer:   I certify this patient will need inpatient services for at least 2 midnights    Order Specific Question:   Estimated Length of Stay    Answer:   1   Unrestricted visitation status    Standing Status:   Standing    Number of Occurrences:   1    Meds ordered this encounter  Medications   atropine 1 MG/10ML injection   EPINEPhrine 10 mcg/mL Adult IV Push Syringe (For Blood Pressure Support)   iohexol (OMNIPAQUE) 350 MG/ML injection 100 mL   EPINEPHrine (ADRENALIN) 5 mg in NS 250 mL (0.02 mg/mL) premix infusion   glucagon (human recombinant) (GLUCAGEN) injection 1 mg   ondansetron (ZOFRAN) injection 4 mg   sodium chloride 0.9 % bolus 1,000 mL   metroNIDAZOLE (FLAGYL) IVPB 500 mg    Order Specific Question:   Antibiotic Indication:    Answer:   Other Indication (list below)   sodium chloride 0.9 % bolus 1,000 mL   vancomycin (VANCOREADY) IVPB 1500 mg/300 mL    Order Specific Question:   Indication:    Answer:   Sepsis   ceFEPIme (MAXIPIME) 2 g in sodium chloride 0.9 % 100 mL IVPB    Order Specific Question:   Antibiotic Indication:    Answer:   Sepsis   calcium gluconate 1 g/ 50 mL sodium chloride IVPB   insulin aspart (novoLOG) injection 5 Units   sodium bicarbonate injection 50 mEq   dextrose 50 % solution 50 mL   sodium chloride 0.9 % bolus 500 mL   sodium bicarbonate 1 mEq/mL injection    Gaboriault, Elana A: cabinet  override   DISCONTD: sodium bicarbonate 150 mEq in sterile water 1,150 mL infusion    Sodium Bicarbonate 150 mEq added to 1L SWFI = 261 mOsm/L.   morphine (PF) 2 MG/ML injection 2-4 mg   LORazepam (ATIVAN) injection 2 mg   glycopyrrolate (ROBINUL) injection 0.1 mg   0.9 %  sodium chloride infusion   OR Linked Order Group    acetaminophen (TYLENOL) tablet 650 mg    acetaminophen (TYLENOL) suppository 650 mg   OR Linked Order Group    glycopyrrolate (ROBINUL) tablet 1 mg    glycopyrrolate (ROBINUL) injection 0.2 mg    glycopyrrolate (ROBINUL) injection 0.2 mg   polyvinyl alcohol (LIQUIFILM TEARS) 1.4 % ophthalmic solution 1 drop     Admission Imaging : CT ANGIO HEAD NECK W WO CM W PERF (CODE STROKE)  Result Date: 08/19/2022 CLINICAL DATA:  Stroke  suspected, not moving in the extremities EXAM: CT ANGIOGRAPHY HEAD AND NECK CT PERFUSION BRAIN TECHNIQUE: Multidetector CT imaging of the head and neck was performed using the standard protocol during bolus administration of intravenous contrast. Multiplanar CT image reconstructions and MIPs were obtained to evaluate the vascular anatomy. Carotid stenosis measurements (when applicable) are obtained utilizing NASCET criteria, using the distal internal carotid diameter as the denominator. Multiphase CT imaging of the brain was performed following IV bolus contrast injection. Subsequent parametric perfusion maps were calculated using RAPID software. RADIATION DOSE REDUCTION: This exam was performed according to the departmental dose-optimization program which includes automated exposure control, adjustment of the mA and/or kV according to patient size and/or use of iterative reconstruction technique. CONTRAST:  OMNIPAQUE IOHEXOL 350 MG/ML SOLN COMPARISON:  None Available. 12/28/2021 CTA head and neck, correlation is also made with 08/27/2022 CT head in 04/22/2022 MRA head FINDINGS: CT HEAD FINDINGS For noncontrast findings, please see same day CT head.  CTA NECK FINDINGS Aortic arch: Two-vessel arch with a common origin of the brachiocephalic and left common carotid arteries. Imaged portion shows no evidence of aneurysm or dissection. No significant stenosis of the major arch vessel origins. Right carotid system: No evidence of dissection, occlusion, or hemodynamically significant stenosis (greater than 50%). Atherosclerotic disease at the bifurcation and in the proximal ICA is not hemodynamically significant. Left carotid system: No evidence of dissection, occlusion, or hemodynamically significant stenosis (greater than 50%). Vertebral arteries: No evidence of dissection, occlusion, or hemodynamically significant stenosis (greater than 50%). Skeleton: No acute osseous abnormality.  It dental list. Other neck: Negative. Upper chest: Heterogeneous opacities in the left greater than right imaged lungs. Review of the MIP images confirms the above findings CTA HEAD FINDINGS Evaluation is limited by bolus timing. Anterior circulation: Both internal carotid arteries are patent to the termini, with multifocal mild-to-moderate stenosis. A1 segments patent. Normal anterior communicating artery. Proximal A2 segments are patent, suboptimal visualization of more distal ACAs. No M1 stenosis or occlusion. MCA branches perfused proximally, with suboptimal visualization of the distal vessels due to bolus timing. Posterior circulation: Vertebral arteries patent to the vertebrobasilar junction without stenosis. Posterior inferior cerebellar arteries patent proximally. Basilar patent to its distal aspect. Superior cerebellar arteries patent proximally. Patent P1 segments. PCAs perfused to their distal aspects without stenosis. The bilateral posterior communicating arteries are patent. Venous sinuses: As permitted by contrast timing, patent. Anatomic variants: None significant. Review of the MIP images confirms the above findings CT Brain Perfusion Findings: ASPECTS: 10 CBF (<30%)  Volume: 41mL Perfusion (Tmax>6.0s) volume: 92mL Mismatch Volume: 59mL Infarction Location:No infarct core. Area of decreased perfusion is primarily in the left cerebral hemisphere, with additional areas of decreased perfusion in the right anterior frontal lobe and temporal lobe. IMPRESSION: 1. Evaluation is somewhat limited by bolus timing. Within this limitation, no intracranial large vessel occlusion. 2. No hemodynamically significant stenosis in the neck. 3. No infarct core. Areas of decreased perfusion in the left greater than right cerebral hemisphere are favored to be artifactual, possibly secondary to movement. 4. Heterogeneous opacities in the left greater than right imaged lung, concerning for pneumonia. Imaging results were communicated on 08/22/2022 at 2:44 pm to provider STACK via secure text paging. Electronically Signed   By: Wiliam Ke M.D.   On: 08/24/2022 14:47   DG Chest Portable 1 View  Result Date: 08/24/2022 CLINICAL DATA:  Shortness of breath EXAM: PORTABLE CHEST 1 VIEW COMPARISON:  Chest x-ray dated August 21, 2022 FINDINGS: Cardiac and mediastinal  contours are unchanged. Persistent left basilar opacity and small lower lung nodule. No large pleural effusion or evidence of pneumothorax. IMPRESSION: 1. Persistent left basilar opacity, likely due to infection or aspiration. 2. Small nodule of the right lower lobe, possibly also present on prior exam. Recommend chest CT for further evaluation. Electronically Signed   By: Yetta Glassman M.D.   On: 09/10/2022 14:36   CT HEAD CODE STROKE WO CONTRAST  Result Date: 09/13/2022 CLINICAL DATA:  Code stroke. EXAM: CT HEAD WITHOUT CONTRAST TECHNIQUE: Contiguous axial images were obtained from the base of the skull through the vertex without intravenous contrast. RADIATION DOSE REDUCTION: This exam was performed according to the departmental dose-optimization program which includes automated exposure control, adjustment of the mA and/or kV  according to patient size and/or use of iterative reconstruction technique. COMPARISON:  None Available. FINDINGS: Brain: No evidence of acute infarction, hemorrhage, mass, mass effect, or midline shift. No hydrocephalus or extra-axial collection. Periventricular white matter changes, likely the sequela of chronic small vessel ischemic disease. Redemonstrated lacunar infarcts in the right greater than left basal ganglia, left pons, and right cerebellum. Vascular: No hyperdense vessel. Skull: Negative for fracture or focal lesion. Sinuses/Orbits: Mild mucosal thickening in the left maxillary sinus. No acute finding in the orbits. Other: The mastoid air cells are well aerated. ASPECTS [facility] Stroke Program Early CT Score) - Ganglionic level infarction (caudate, lentiform nuclei, internal capsule, insula, M1-M3 cortex): 7 - Supraganglionic infarction (M4-M6 cortex): 3 Total score (0-10 with 10 being normal): 10 IMPRESSION: 1. No acute intracranial process. 2. ASPECTS is 10. Imaging results were communicated on 09/15/2022 at 1:58 pm to provider STACK via secure text paging. Electronically Signed   By: Merilyn Baba M.D.   On: 09/15/2022 13:59   Physical Examination: Vitals:   09/05/2022 1800 09/06/2022 1815 08/30/2022 2045 2022/10/11 0030  BP:      Pulse: (!) 46 (!) 38 (!) 35 (!) 33  Resp:      SpO2: 90% (!) 84% 95% (!) 88%  Limited physical exam patient is comfort measures. Physical Exam Vitals and nursing note reviewed.  Constitutional:      General: He is not in acute distress. HENT:     Head: Normocephalic.  Eyes:     General: Lids are normal.     Pupils:     Right eye: Pupil is not reactive.     Left eye: Pupil is not reactive.  Cardiovascular:     Rate and Rhythm: Bradycardia present.     Heart sounds: Normal heart sounds.  Pulmonary:     Breath sounds: No rales.  Abdominal:     General: There is distension.     Palpations: Abdomen is soft.       Assessment and Plan: * AMS (altered  mental status) Pt has been unresponsive since earlier today. D/D include cardiac or VTE event. Suspect pt may have had hypoxic injury to brain from possible PE.  Pt is DNR and comfort measures per family wishes.   Comfort measures only status Comfort measure order set.  No distress currently.      DVT prophylaxis:  N/A   Code Status:  DNR.  Family Communication:  Dre, [place] (Spouse) [phone]    Disposition Plan:  TBD.    Consults called:  None.   Admission status: Inpatient.   Unit/ Expected LOS: Med surg.    Para Skeans MD [facility]  6 PM- 2 AM. Please contact me via secure Chat 6 PM-2  AM. [phone]( Pager ) To contact the [facility] Attending or Consulting provider [place] or covering provider during after hours [place], for this patient.   Check the care team in [facility] and look for a) attending/consulting [facility] provider listed and b) the [facility] team listed Log into [email or web address] and use [place]'s universal password to access. If you do not have the password, please contact the hospital operator. Locate the [facility] provider you are looking for under [facility] and page to a number that you can be directly reached. If you still have difficulty reaching the provider, please page the [facility] (Director on Call) for the Hospitalists listed on amion for assistance. [email or web address] Oct 06, 2022, 12:50 AM

## 2022-09-16 NOTE — Telephone Encounter (Signed)
Pt came in to be seen however he was very weak and fatigued.  Provider advised to go to the ER.  Wife requested feed for the tube and all of his medications in liquid form.  Please advise.

## 2022-09-16 NOTE — ED Notes (Signed)
Dr. Mortimer Fries completed conversation with pt's spouse, and pt's family. Spouse and family are in agreement that no further interventions should be performed on pt. Per dr. Mortimer Fries, not increase of epi drip and no further interventions are to be done. Dr. Mortimer Fries states pt's family is waiting for a few more members to arrive, a sister and a pastor, and then they would like to withdrawal care.

## 2022-09-17 ENCOUNTER — Telehealth: Payer: Self-pay | Admitting: *Deleted

## 2022-09-17 DIAGNOSIS — N184 Chronic kidney disease, stage 4 (severe): Secondary | ICD-10-CM | POA: Diagnosis present

## 2022-09-17 DIAGNOSIS — N171 Acute kidney failure with acute cortical necrosis: Secondary | ICD-10-CM | POA: Diagnosis present

## 2022-09-17 DIAGNOSIS — I480 Paroxysmal atrial fibrillation: Secondary | ICD-10-CM | POA: Diagnosis present

## 2022-09-17 DIAGNOSIS — Z7901 Long term (current) use of anticoagulants: Secondary | ICD-10-CM | POA: Diagnosis not present

## 2022-09-17 DIAGNOSIS — E871 Hypo-osmolality and hyponatremia: Secondary | ICD-10-CM | POA: Diagnosis present

## 2022-09-17 DIAGNOSIS — Z8249 Family history of ischemic heart disease and other diseases of the circulatory system: Secondary | ICD-10-CM | POA: Diagnosis not present

## 2022-09-17 DIAGNOSIS — Z515 Encounter for palliative care: Secondary | ICD-10-CM

## 2022-09-17 DIAGNOSIS — Z87891 Personal history of nicotine dependence: Secondary | ICD-10-CM | POA: Diagnosis not present

## 2022-09-17 DIAGNOSIS — J9602 Acute respiratory failure with hypercapnia: Secondary | ICD-10-CM | POA: Diagnosis present

## 2022-09-17 DIAGNOSIS — R4182 Altered mental status, unspecified: Secondary | ICD-10-CM | POA: Diagnosis present

## 2022-09-17 DIAGNOSIS — E875 Hyperkalemia: Secondary | ICD-10-CM | POA: Diagnosis present

## 2022-09-17 DIAGNOSIS — R570 Cardiogenic shock: Secondary | ICD-10-CM | POA: Diagnosis present

## 2022-09-17 DIAGNOSIS — D539 Nutritional anemia, unspecified: Secondary | ICD-10-CM | POA: Diagnosis present

## 2022-09-17 DIAGNOSIS — Z66 Do not resuscitate: Secondary | ICD-10-CM | POA: Diagnosis present

## 2022-09-17 DIAGNOSIS — Z931 Gastrostomy status: Secondary | ICD-10-CM | POA: Diagnosis not present

## 2022-09-17 DIAGNOSIS — A419 Sepsis, unspecified organism: Secondary | ICD-10-CM | POA: Diagnosis present

## 2022-09-17 DIAGNOSIS — I130 Hypertensive heart and chronic kidney disease with heart failure and stage 1 through stage 4 chronic kidney disease, or unspecified chronic kidney disease: Secondary | ICD-10-CM | POA: Diagnosis present

## 2022-09-17 DIAGNOSIS — R6521 Severe sepsis with septic shock: Secondary | ICD-10-CM | POA: Diagnosis present

## 2022-09-17 DIAGNOSIS — J690 Pneumonitis due to inhalation of food and vomit: Secondary | ICD-10-CM | POA: Diagnosis present

## 2022-09-17 DIAGNOSIS — J9601 Acute respiratory failure with hypoxia: Secondary | ICD-10-CM | POA: Diagnosis present

## 2022-09-17 DIAGNOSIS — E7800 Pure hypercholesterolemia, unspecified: Secondary | ICD-10-CM | POA: Diagnosis present

## 2022-09-17 DIAGNOSIS — Z8673 Personal history of transient ischemic attack (TIA), and cerebral infarction without residual deficits: Secondary | ICD-10-CM | POA: Diagnosis not present

## 2022-09-17 DIAGNOSIS — I2510 Atherosclerotic heart disease of native coronary artery without angina pectoris: Secondary | ICD-10-CM | POA: Diagnosis present

## 2022-09-17 DIAGNOSIS — E872 Acidosis, unspecified: Secondary | ICD-10-CM | POA: Diagnosis present

## 2022-09-17 DIAGNOSIS — E1322 Other specified diabetes mellitus with diabetic chronic kidney disease: Secondary | ICD-10-CM | POA: Diagnosis present

## 2022-09-17 MED ORDER — POLYVINYL ALCOHOL 1.4 % OP SOLN: 1.0000 [drp] | Freq: Four times a day (QID) | OPHTHALMIC | Status: DC | PRN

## 2022-09-17 MED ORDER — ACETAMINOPHEN 325 MG PO TABS: 650.0000 mg | ORAL_TABLET | Freq: Four times a day (QID) | ORAL | Status: DC | PRN

## 2022-09-17 MED ORDER — SODIUM CHLORIDE 0.9 % IV SOLN: INTRAVENOUS | Status: DC

## 2022-09-17 MED ORDER — GLYCOPYRROLATE 1 MG PO TABS: 1.0000 mg | ORAL_TABLET | ORAL | Status: DC | PRN

## 2022-09-17 MED ORDER — ACETAMINOPHEN 650 MG RE SUPP: 650.0000 mg | Freq: Four times a day (QID) | RECTAL | Status: DC | PRN

## 2022-09-17 MED ORDER — GLYCOPYRROLATE 0.2 MG/ML IJ SOLN: 0.2000 mg | INTRAMUSCULAR | Status: DC | PRN

## 2022-09-17 DEATH — deceased

## 2022-09-21 ENCOUNTER — Encounter: Payer: 59 | Admitting: *Deleted

## 2022-09-22 ENCOUNTER — Encounter: Payer: Medicare Other | Admitting: Occupational Therapy

## 2022-09-22 ENCOUNTER — Ambulatory Visit: Payer: Medicare Other

## 2022-09-23 ENCOUNTER — Encounter: Payer: Medicare Other | Admitting: Speech Pathology

## 2022-09-23 ENCOUNTER — Other Ambulatory Visit: Payer: Self-pay | Admitting: Nurse Practitioner

## 2022-09-23 ENCOUNTER — Encounter: Payer: Medicare Other | Admitting: Occupational Therapy

## 2022-09-23 ENCOUNTER — Ambulatory Visit: Payer: Medicare Other

## 2022-09-27 LAB — BLOOD GAS, ARTERIAL
Acid-base deficit: 14 mmol/L — ABNORMAL HIGH (ref 0.0–2.0)
Bicarbonate: 12.1 mmol/L — ABNORMAL LOW (ref 20.0–28.0)
Drawn by: 30136
O2 Content: 15 L/min
O2 Saturation: 98.2 %
Patient temperature: 37
pCO2 arterial: 29 mmHg — ABNORMAL LOW (ref 32–48)
pH, Arterial: 7.23 — ABNORMAL LOW (ref 7.35–7.45)
pO2, Arterial: 92 mmHg (ref 83–108)

## 2022-09-29 ENCOUNTER — Ambulatory Visit: Payer: Medicare Other

## 2022-09-29 ENCOUNTER — Encounter: Payer: Medicare Other | Admitting: Occupational Therapy

## 2022-09-30 ENCOUNTER — Encounter: Payer: Medicare Other | Admitting: Occupational Therapy

## 2022-09-30 ENCOUNTER — Ambulatory Visit: Payer: Medicare Other

## 2022-09-30 ENCOUNTER — Encounter: Payer: Medicare Other | Admitting: Speech Pathology

## 2022-10-06 ENCOUNTER — Ambulatory Visit: Payer: Medicare Other

## 2022-10-06 ENCOUNTER — Encounter: Payer: Medicare Other | Admitting: Occupational Therapy

## 2022-10-07 ENCOUNTER — Ambulatory Visit: Payer: Medicare Other

## 2022-10-07 ENCOUNTER — Encounter: Payer: Medicare Other | Admitting: Speech Pathology

## 2022-10-07 ENCOUNTER — Encounter: Payer: Medicare Other | Admitting: Occupational Therapy

## 2022-10-13 ENCOUNTER — Encounter: Payer: Medicare Other | Admitting: Occupational Therapy

## 2022-10-13 ENCOUNTER — Ambulatory Visit: Payer: Medicare Other

## 2022-10-14 ENCOUNTER — Encounter: Payer: Medicare Other | Admitting: Speech Pathology

## 2022-10-14 ENCOUNTER — Ambulatory Visit: Payer: Medicare Other

## 2022-10-14 ENCOUNTER — Encounter: Payer: Medicare Other | Admitting: Occupational Therapy

## 2022-10-16 NOTE — Progress Notes (Signed)
CROSS COVER NOTE  NAME: Evan BRAVER Sr. MRN: [id number] DOB : 02-01-1956     HPI/Events of Note   Patient admitted 09/03/2022 with altered mental status and septic shock. He was bradycardic in the 30s and hypotensive requiring vasopressors including epi drip without significant improvement. Patient was made comfort care by his family .  He passed away 0415 am on this day Oct 15, 2022.  Death pronounced by 2 RNs  Assessment and  Interventions   Assessment:  Plan: Message sent to attending at time of death, Dr Florina Ou     Kathlene Cote NP [facility]

## 2022-10-16 NOTE — Patient Outreach (Signed)
  Care Coordination   Follow Up Visit Note   10/17/22 Name: Evan SCHURMAN Sr. MRN: [id number] DOB: 11-05-1955  Evan Cones Sr. is a 67 y.o. year old male who sees Jon Billings, NP for primary care.   What matters to the patients health and wellness today?  Noted per chart that patient passed away today.  Will close case at this time.     Goals Addressed             This Visit's Progress    COMPLETED: Management of chronic medical conditions   Not on track    Care Coordination Interventions: Evaluation of current treatment plan related to A-fib, residual from stroke, cystitis and patient's adherence to plan as established by provider Collaborated with CSW regarding wife getting paid through [employer] to care for patient.  Appointment with DSS scheduled for 1/24 Reviewed scheduled/upcoming provider appointments including cardiology on 1/24, podiatry on 1/25, PCP on 1/31.   Discussed plans with patient for ongoing care management follow up and provided patient with direct contact information for care management team Per wife, feeding tube placed due to recurrent episodes of aspiration pneumonia.  Home Health team will be doing teaching for care of tube as well as PT/OT. Collaborated with PCP office in regards to location of visit on 1/31 as wife state she will be unable to get patient out of the home for the appointment due to decreased mobility.  Unfortunately visit has to be in person as it is a post discharge follow up.  Wife made aware.             SDOH assessments and interventions completed:  No     Care Coordination Interventions:  No, not indicated   Follow up plan: No further intervention required.   Encounter Outcome:  Pt. Visit Completed   Valente David, RN, MSN, [place] [facility] Care Management Coordinator [phone]

## 2022-10-16 NOTE — Assessment & Plan Note (Signed)
Comfort measure order set.  No distress currently.

## 2022-10-16 NOTE — Assessment & Plan Note (Signed)
Pt has been unresponsive since earlier today. D/D include cardiac or VTE event. Suspect pt may have had hypoxic injury to brain from possible PE.  Pt is DNR and comfort measures per family wishes.

## 2022-10-16 DEATH — deceased

## 2022-10-20 ENCOUNTER — Ambulatory Visit: Payer: Medicare Other

## 2022-10-20 ENCOUNTER — Encounter: Payer: Medicare Other | Admitting: Occupational Therapy

## 2022-10-21 ENCOUNTER — Encounter: Payer: Medicare Other | Admitting: Occupational Therapy

## 2022-10-21 ENCOUNTER — Encounter: Payer: Medicare Other | Admitting: Speech Pathology

## 2022-10-21 ENCOUNTER — Ambulatory Visit: Payer: Medicare Other

## 2022-10-27 ENCOUNTER — Encounter: Payer: Medicare Other | Admitting: Occupational Therapy

## 2022-10-27 ENCOUNTER — Ambulatory Visit: Payer: Medicare Other

## 2022-10-28 ENCOUNTER — Ambulatory Visit: Payer: Medicare Other

## 2022-10-28 ENCOUNTER — Encounter: Payer: Medicare Other | Admitting: Speech Pathology

## 2022-10-28 ENCOUNTER — Encounter: Payer: Medicare Other | Admitting: Occupational Therapy

## 2022-11-03 ENCOUNTER — Encounter: Payer: Medicare Other | Admitting: Occupational Therapy

## 2022-11-03 ENCOUNTER — Ambulatory Visit: Payer: Medicare Other

## 2022-11-04 ENCOUNTER — Ambulatory Visit: Payer: Medicare Other

## 2022-11-04 ENCOUNTER — Encounter: Payer: Medicare Other | Admitting: Occupational Therapy

## 2022-11-04 ENCOUNTER — Encounter: Payer: Medicare Other | Admitting: Speech Pathology
